# Patient Record
Sex: Female | Born: 1965
Health system: Southern US, Community
[De-identification: ages and names within clinical notes are randomized; demographics above are authoritative.]

## PROBLEM LIST (undated history)

## (undated) ENCOUNTER — Emergency Department (HOSPITAL_COMMUNITY): Admission: EM | Payer: 59 | Source: Home / Self Care

## (undated) DIAGNOSIS — E119 Type 2 diabetes mellitus without complications: Secondary | ICD-10-CM

## (undated) DIAGNOSIS — I4891 Unspecified atrial fibrillation: Secondary | ICD-10-CM

## (undated) DIAGNOSIS — I639 Cerebral infarction, unspecified: Secondary | ICD-10-CM

## (undated) DIAGNOSIS — I714 Abdominal aortic aneurysm, without rupture, unspecified: Secondary | ICD-10-CM

## (undated) DIAGNOSIS — I482 Chronic atrial fibrillation, unspecified: Secondary | ICD-10-CM

## (undated) DIAGNOSIS — I7121 Aneurysm of the ascending aorta, without rupture: Secondary | ICD-10-CM

## (undated) DIAGNOSIS — I10 Essential (primary) hypertension: Secondary | ICD-10-CM

## (undated) DIAGNOSIS — Z0389 Encounter for observation for other suspected diseases and conditions ruled out: Secondary | ICD-10-CM

## (undated) DIAGNOSIS — G8929 Other chronic pain: Secondary | ICD-10-CM

## (undated) DIAGNOSIS — I5022 Chronic systolic (congestive) heart failure: Secondary | ICD-10-CM

## (undated) DIAGNOSIS — K760 Fatty (change of) liver, not elsewhere classified: Secondary | ICD-10-CM

## (undated) DIAGNOSIS — IMO0001 Reserved for inherently not codable concepts without codable children: Secondary | ICD-10-CM

## (undated) DIAGNOSIS — I712 Thoracic aortic aneurysm, without rupture: Secondary | ICD-10-CM

## (undated) DIAGNOSIS — T7840XA Allergy, unspecified, initial encounter: Secondary | ICD-10-CM

## (undated) DIAGNOSIS — I4821 Permanent atrial fibrillation: Secondary | ICD-10-CM

## (undated) DIAGNOSIS — N83209 Unspecified ovarian cyst, unspecified side: Secondary | ICD-10-CM

## (undated) DIAGNOSIS — D649 Anemia, unspecified: Secondary | ICD-10-CM

## (undated) DIAGNOSIS — J449 Chronic obstructive pulmonary disease, unspecified: Secondary | ICD-10-CM

## (undated) DIAGNOSIS — I350 Nonrheumatic aortic (valve) stenosis: Secondary | ICD-10-CM

## (undated) DIAGNOSIS — M1990 Unspecified osteoarthritis, unspecified site: Secondary | ICD-10-CM

## (undated) DIAGNOSIS — R4701 Aphasia: Secondary | ICD-10-CM

## (undated) DIAGNOSIS — F1410 Cocaine abuse, uncomplicated: Secondary | ICD-10-CM

## (undated) DIAGNOSIS — K219 Gastro-esophageal reflux disease without esophagitis: Secondary | ICD-10-CM

## (undated) DIAGNOSIS — F1990 Other psychoactive substance use, unspecified, uncomplicated: Secondary | ICD-10-CM

## (undated) DIAGNOSIS — R109 Unspecified abdominal pain: Secondary | ICD-10-CM

## (undated) DIAGNOSIS — I509 Heart failure, unspecified: Secondary | ICD-10-CM

## (undated) DIAGNOSIS — M109 Gout, unspecified: Secondary | ICD-10-CM

## (undated) HISTORY — DX: Fatty (change of) liver, not elsewhere classified: K76.0

## (undated) HISTORY — DX: Chronic obstructive pulmonary disease, unspecified: J44.9

## (undated) HISTORY — DX: Permanent atrial fibrillation: I48.21

## (undated) HISTORY — DX: Nonrheumatic aortic (valve) stenosis: I35.0

## (undated) HISTORY — DX: Chronic systolic (congestive) heart failure: I50.22

## (undated) HISTORY — DX: Encounter for observation for other suspected diseases and conditions ruled out: Z03.89

## (undated) HISTORY — DX: Gastro-esophageal reflux disease without esophagitis: K21.9

## (undated) HISTORY — DX: Other psychoactive substance use, unspecified, uncomplicated: F19.90

## (undated) HISTORY — DX: Allergy, unspecified, initial encounter: T78.40XA

## (undated) HISTORY — DX: Unspecified osteoarthritis, unspecified site: M19.90

## (undated) HISTORY — DX: Type 2 diabetes mellitus without complications: E11.9

## (undated) HISTORY — DX: Anemia, unspecified: D64.9

## (undated) HISTORY — DX: Reserved for inherently not codable concepts without codable children: IMO0001

## (undated) HISTORY — DX: Cocaine abuse, uncomplicated: F14.10

## (undated) HISTORY — DX: Essential (primary) hypertension: I10

---

## 1992-08-05 HISTORY — PX: TUBAL LIGATION: SHX77

## 1993-08-05 HISTORY — PX: CHOLECYSTECTOMY: SHX55

## 1997-12-23 ENCOUNTER — Emergency Department (HOSPITAL_COMMUNITY): Admission: EM | Admit: 1997-12-23 | Discharge: 1997-12-23 | Payer: Self-pay | Admitting: *Deleted

## 1998-01-12 ENCOUNTER — Emergency Department (HOSPITAL_COMMUNITY): Admission: EM | Admit: 1998-01-12 | Discharge: 1998-01-12 | Payer: Self-pay | Admitting: Emergency Medicine

## 1998-01-26 ENCOUNTER — Other Ambulatory Visit: Admission: RE | Admit: 1998-01-26 | Discharge: 1998-01-26 | Payer: Self-pay | Admitting: Obstetrics

## 1998-03-03 ENCOUNTER — Emergency Department (HOSPITAL_COMMUNITY): Admission: EM | Admit: 1998-03-03 | Discharge: 1998-03-03 | Payer: Self-pay | Admitting: Emergency Medicine

## 2001-04-13 ENCOUNTER — Emergency Department (HOSPITAL_COMMUNITY): Admission: EM | Admit: 2001-04-13 | Discharge: 2001-04-13 | Payer: Self-pay | Admitting: Emergency Medicine

## 2001-04-13 ENCOUNTER — Encounter: Payer: Self-pay | Admitting: Emergency Medicine

## 2002-03-25 ENCOUNTER — Emergency Department (HOSPITAL_COMMUNITY): Admission: EM | Admit: 2002-03-25 | Discharge: 2002-03-25 | Payer: Self-pay | Admitting: Emergency Medicine

## 2002-03-25 ENCOUNTER — Encounter: Payer: Self-pay | Admitting: Emergency Medicine

## 2002-06-07 ENCOUNTER — Emergency Department (HOSPITAL_COMMUNITY): Admission: EM | Admit: 2002-06-07 | Discharge: 2002-06-07 | Payer: Self-pay | Admitting: Emergency Medicine

## 2002-07-13 ENCOUNTER — Encounter: Payer: Self-pay | Admitting: *Deleted

## 2002-07-13 ENCOUNTER — Emergency Department (HOSPITAL_COMMUNITY): Admission: EM | Admit: 2002-07-13 | Discharge: 2002-07-13 | Payer: Self-pay | Admitting: *Deleted

## 2002-08-23 ENCOUNTER — Emergency Department (HOSPITAL_COMMUNITY): Admission: EM | Admit: 2002-08-23 | Discharge: 2002-08-23 | Payer: Self-pay | Admitting: Emergency Medicine

## 2002-08-23 ENCOUNTER — Encounter: Payer: Self-pay | Admitting: Emergency Medicine

## 2003-04-27 ENCOUNTER — Emergency Department (HOSPITAL_COMMUNITY): Admission: EM | Admit: 2003-04-27 | Discharge: 2003-04-28 | Payer: Self-pay | Admitting: Emergency Medicine

## 2003-04-27 ENCOUNTER — Encounter: Payer: Self-pay | Admitting: Emergency Medicine

## 2003-10-05 ENCOUNTER — Emergency Department (HOSPITAL_COMMUNITY): Admission: EM | Admit: 2003-10-05 | Discharge: 2003-10-05 | Payer: Self-pay | Admitting: Emergency Medicine

## 2005-02-19 ENCOUNTER — Emergency Department (HOSPITAL_COMMUNITY): Admission: EM | Admit: 2005-02-19 | Discharge: 2005-02-20 | Payer: Self-pay | Admitting: Emergency Medicine

## 2005-02-20 IMAGING — CT CT ABDOMEN W/ CM
1 of 3 series · 14 of 32 positions shown, 19 images · IV contrast (omnipaque)
Comparison: none

CLINICAL DATA: 38-year-old female with midline abdominal pain.  Status post hysterectomy and prior C-section. 
ABDOMEN CT WITH CONTRAST:
TECHNIQUE: Multidetector CT imaging of the abdomen was performed following the standard protocol during bolus administration of intravenous contrast.
Contrast:  100 cc Omnipaque 300 and oral contrast.
TECHNIQUE: Multidetector CT imaging of the pelvis was performed following the standard protocol during bolus administration of intravenous contrast.

[Series 4929: — · axial · 0.74mm/px · z∈[+1324,+1700]mm · 14 of 85 slices shown, 19 images]
[im 5/85  soft-tissue]
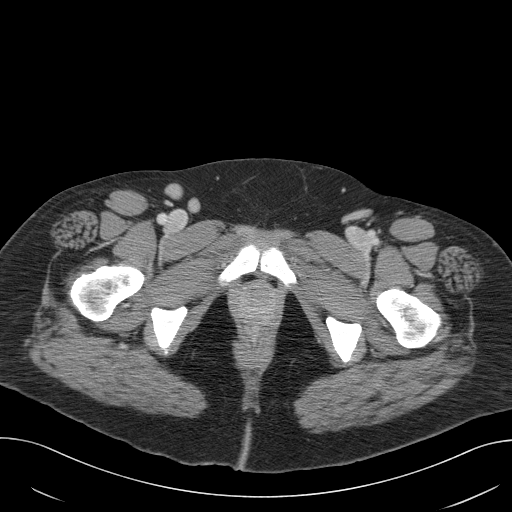
[im 5/85  bone]
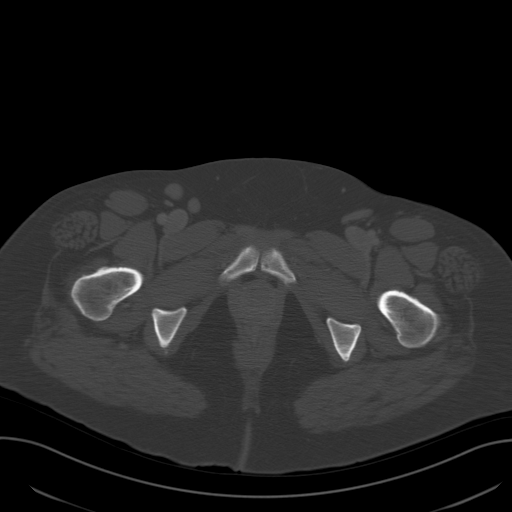
[im 10/85  soft-tissue]
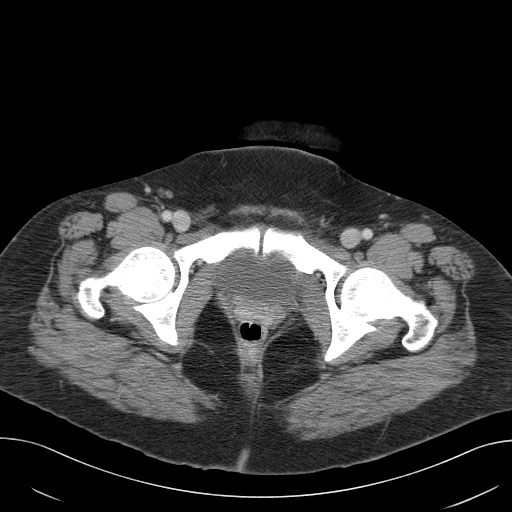
[im 20/85  soft-tissue]
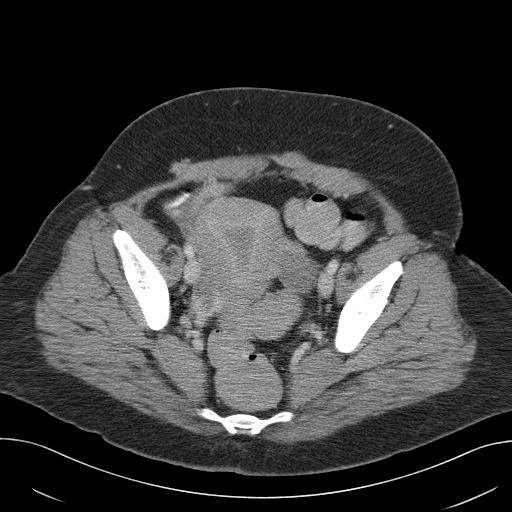
[im 25/85  soft-tissue]
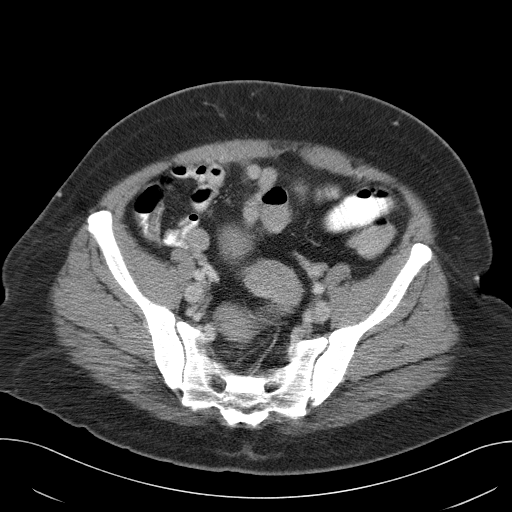
[im 30/85  soft-tissue]
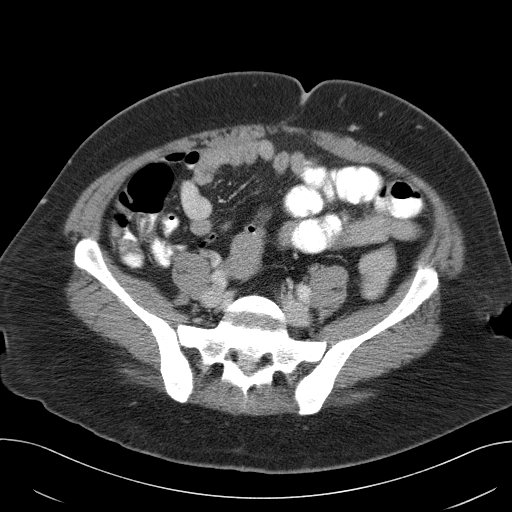
[im 35/85  soft-tissue]
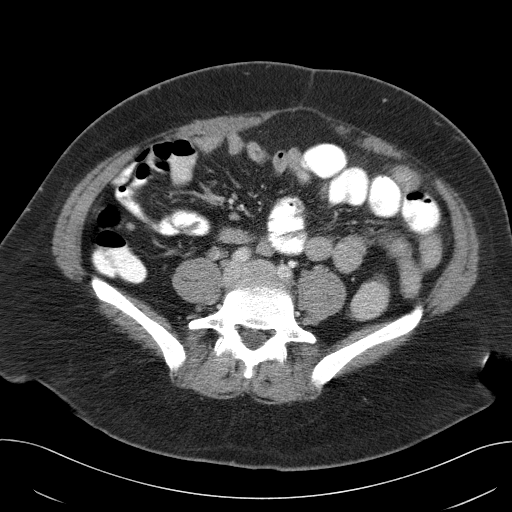
[im 45/85  soft-tissue]
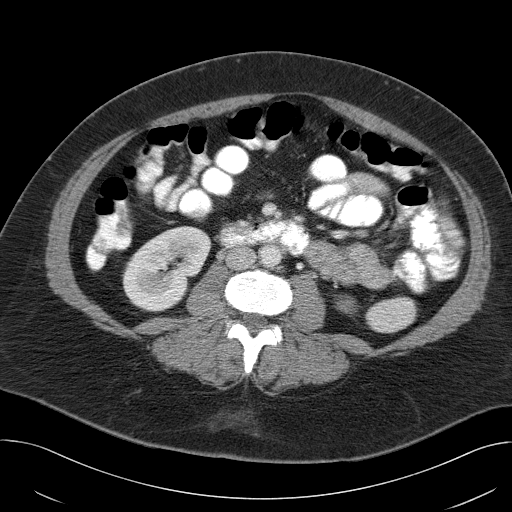
[im 50/85  soft-tissue]
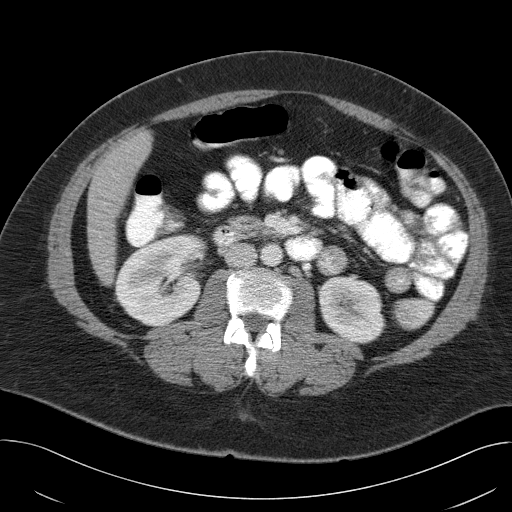
[im 55/85  soft-tissue]
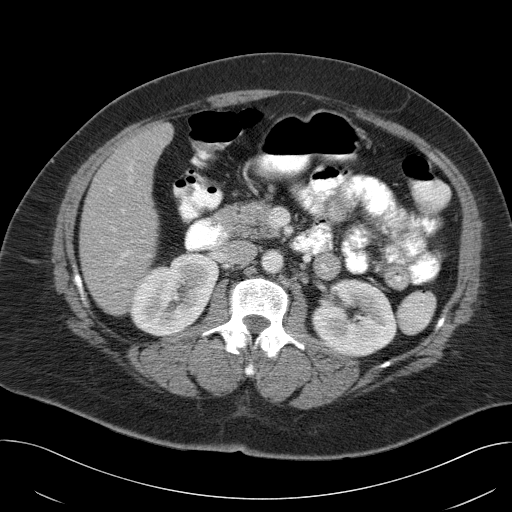
[im 55/85  bone]
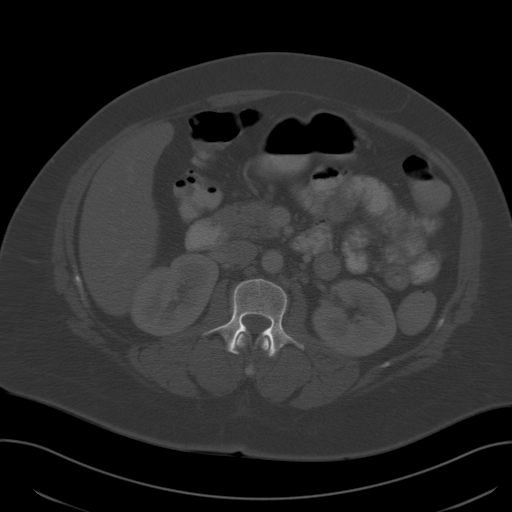
[im 60/85  soft-tissue]
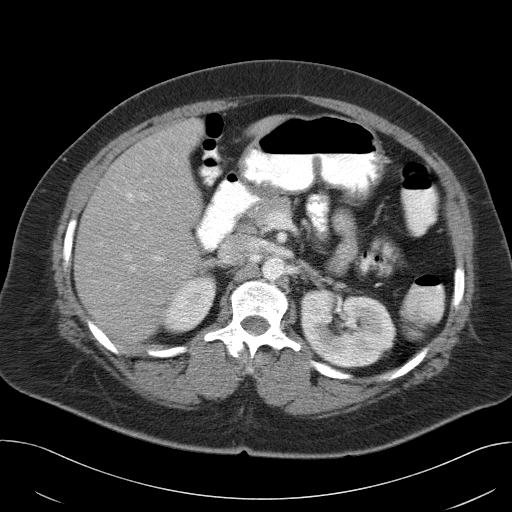
[im 65/85  soft-tissue]
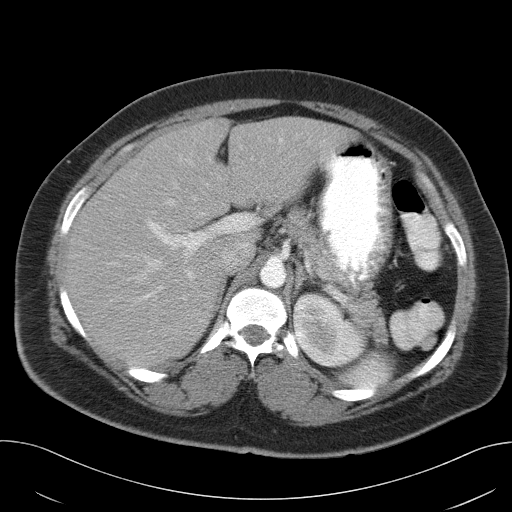
[im 65/85  lung]
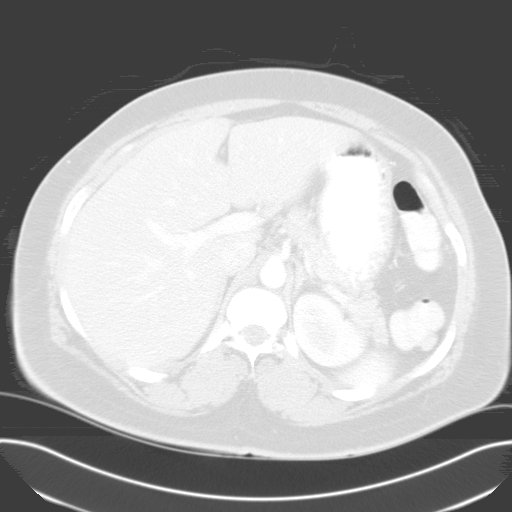
[im 70/85  lung]
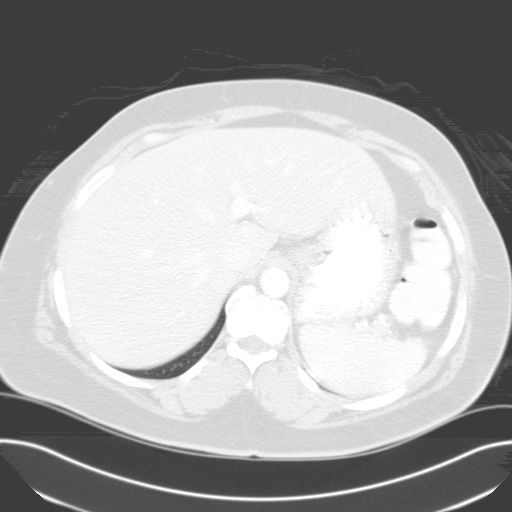
[im 75/85  soft-tissue]
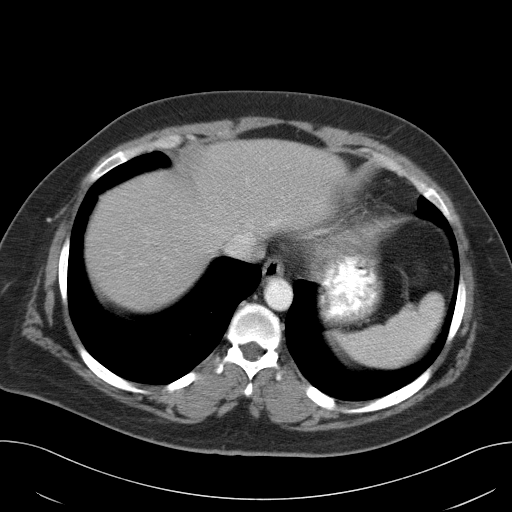
[im 75/85  lung]
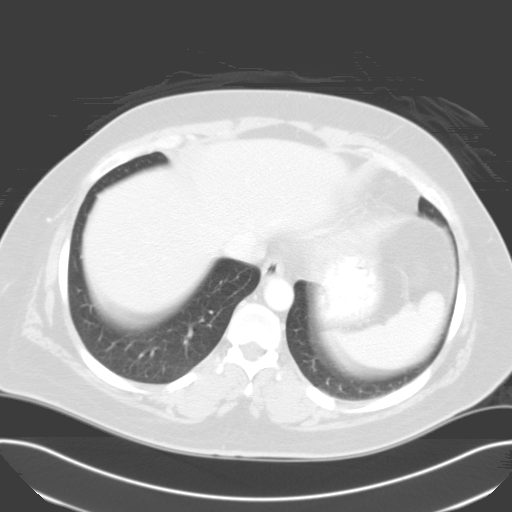
[im 80/85  soft-tissue]
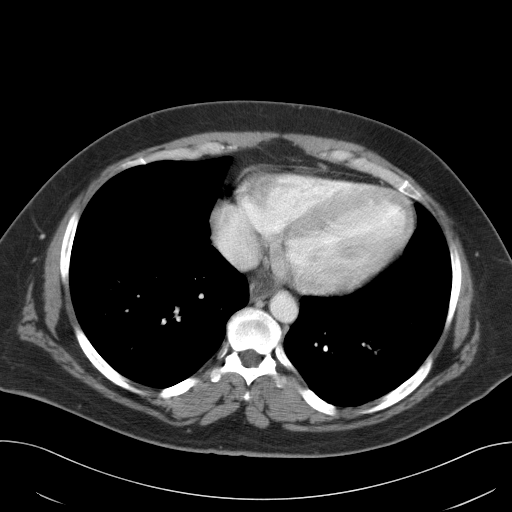
[im 80/85  lung]
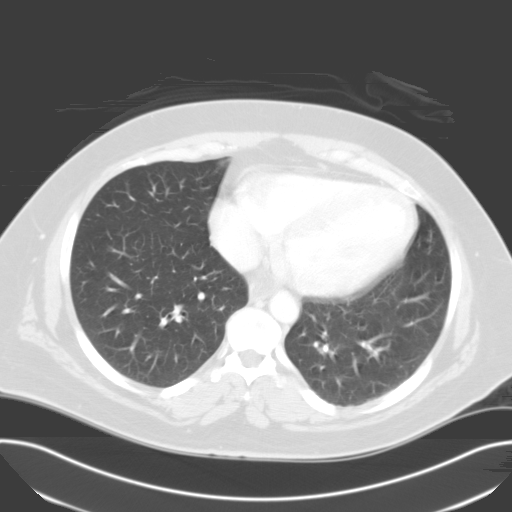

[14 of 32 positions shown; findings below may reference images not displayed]

FINDINGS: Lung bases are clear.  Normal heart size.  No pericardial or pleural fluid. 
The patient has had a cholecystectomy.  Liver, kidneys, adrenal glands, spleen, and pancreas are normal for age.  No bowel wall thickening, dilatation, obstruction, ascites, adenopathy, or free air.  In the right lower quadrant, the terminal ileum and appendix are normal.
IMPRESSION: 1.  No acute finding in the abdomen. 
2.  Status post cholecystectomy. 
PELVIS CT WITH CONTRAST:
FINDINGS: Mildly prominent inguinal lymph nodes.  No pelvic adenopathy, free fluid, or bowel abnormality.  Prominent ovaries.
IMPRESSION: No acute pelvic finding.

## 2006-03-26 ENCOUNTER — Emergency Department (HOSPITAL_COMMUNITY): Admission: EM | Admit: 2006-03-26 | Discharge: 2006-03-26 | Payer: Self-pay | Admitting: Emergency Medicine

## 2006-04-03 ENCOUNTER — Emergency Department (HOSPITAL_COMMUNITY): Admission: EM | Admit: 2006-04-03 | Discharge: 2006-04-03 | Payer: Self-pay | Admitting: Emergency Medicine

## 2006-04-03 IMAGING — CR DG CHEST 2V
2 series · 2 of 2 positions shown · non-contrast
Comparison: none

CLINICAL DATA: Upper back pain with breathing

Chest 2 view:
No previous available for comparison. Mild enlargement of cardiac silhouette.
Lungs clear. No effusion. Vascular clips in the right upper abdomen.

[view not recorded (1 of 2)]
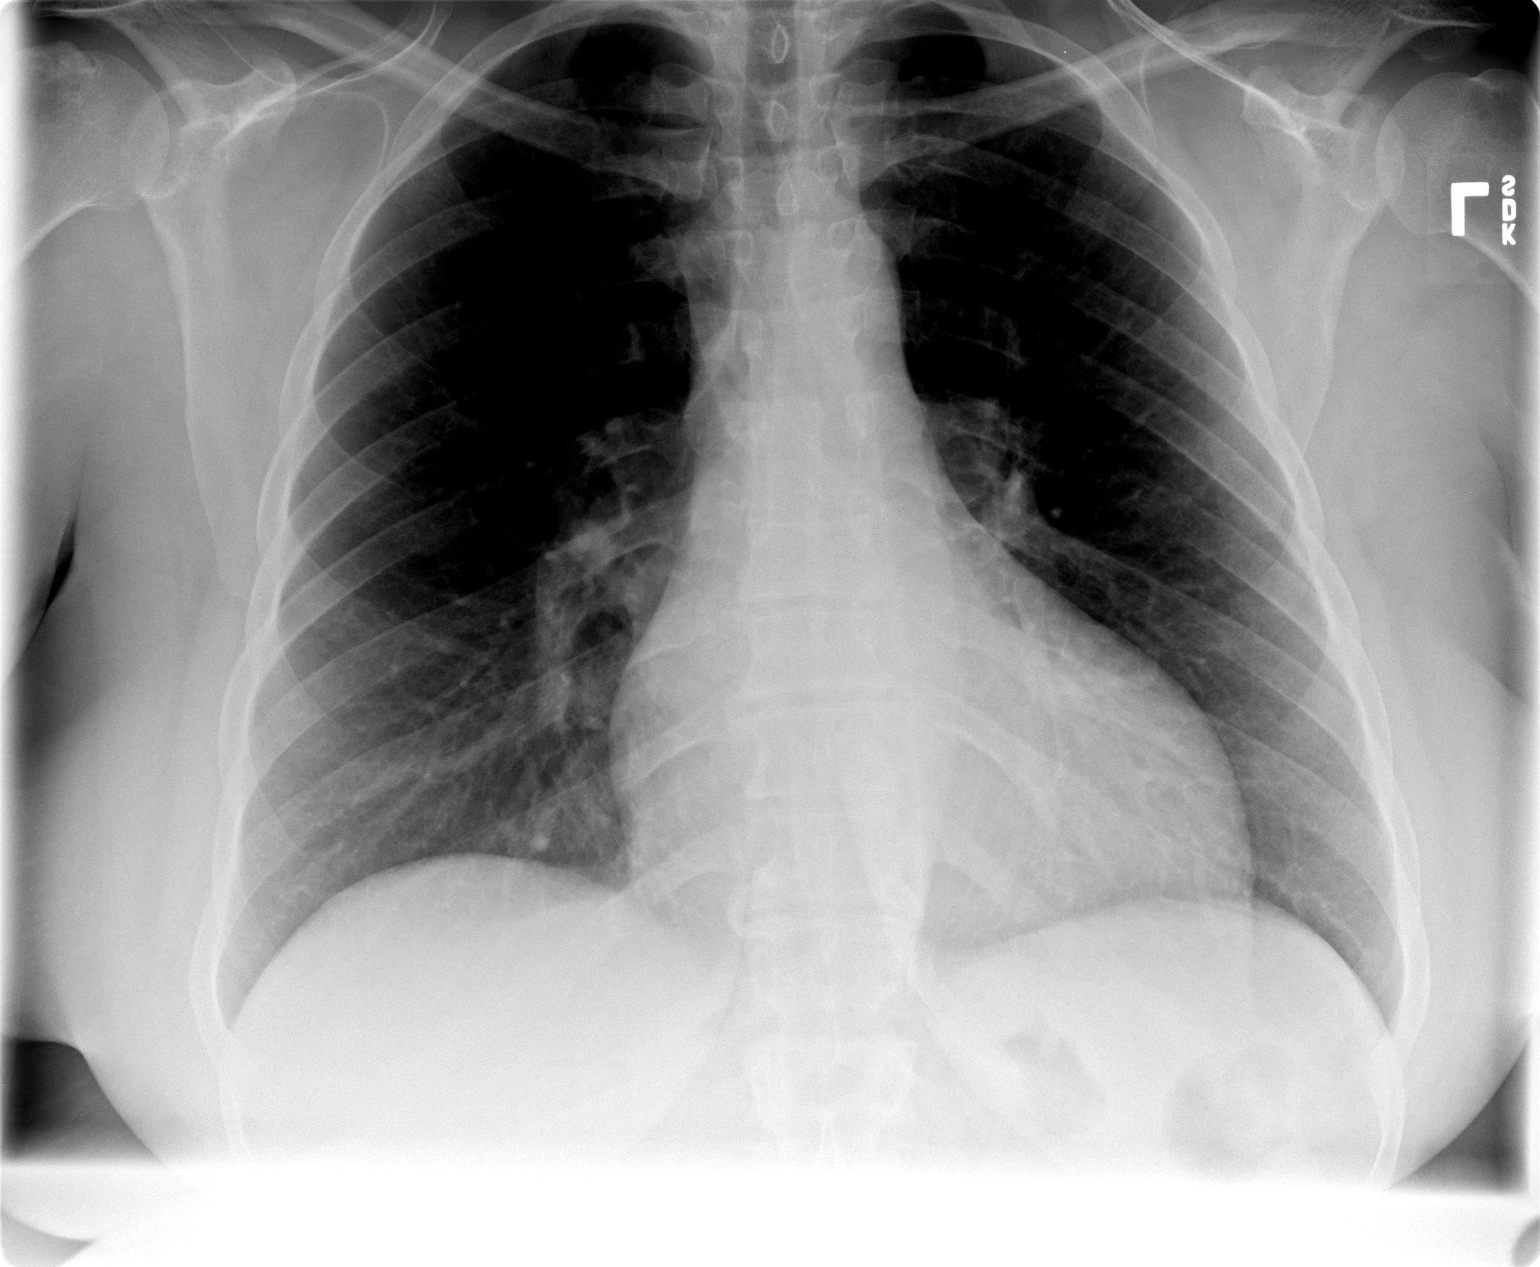

[view not recorded (2 of 2)]
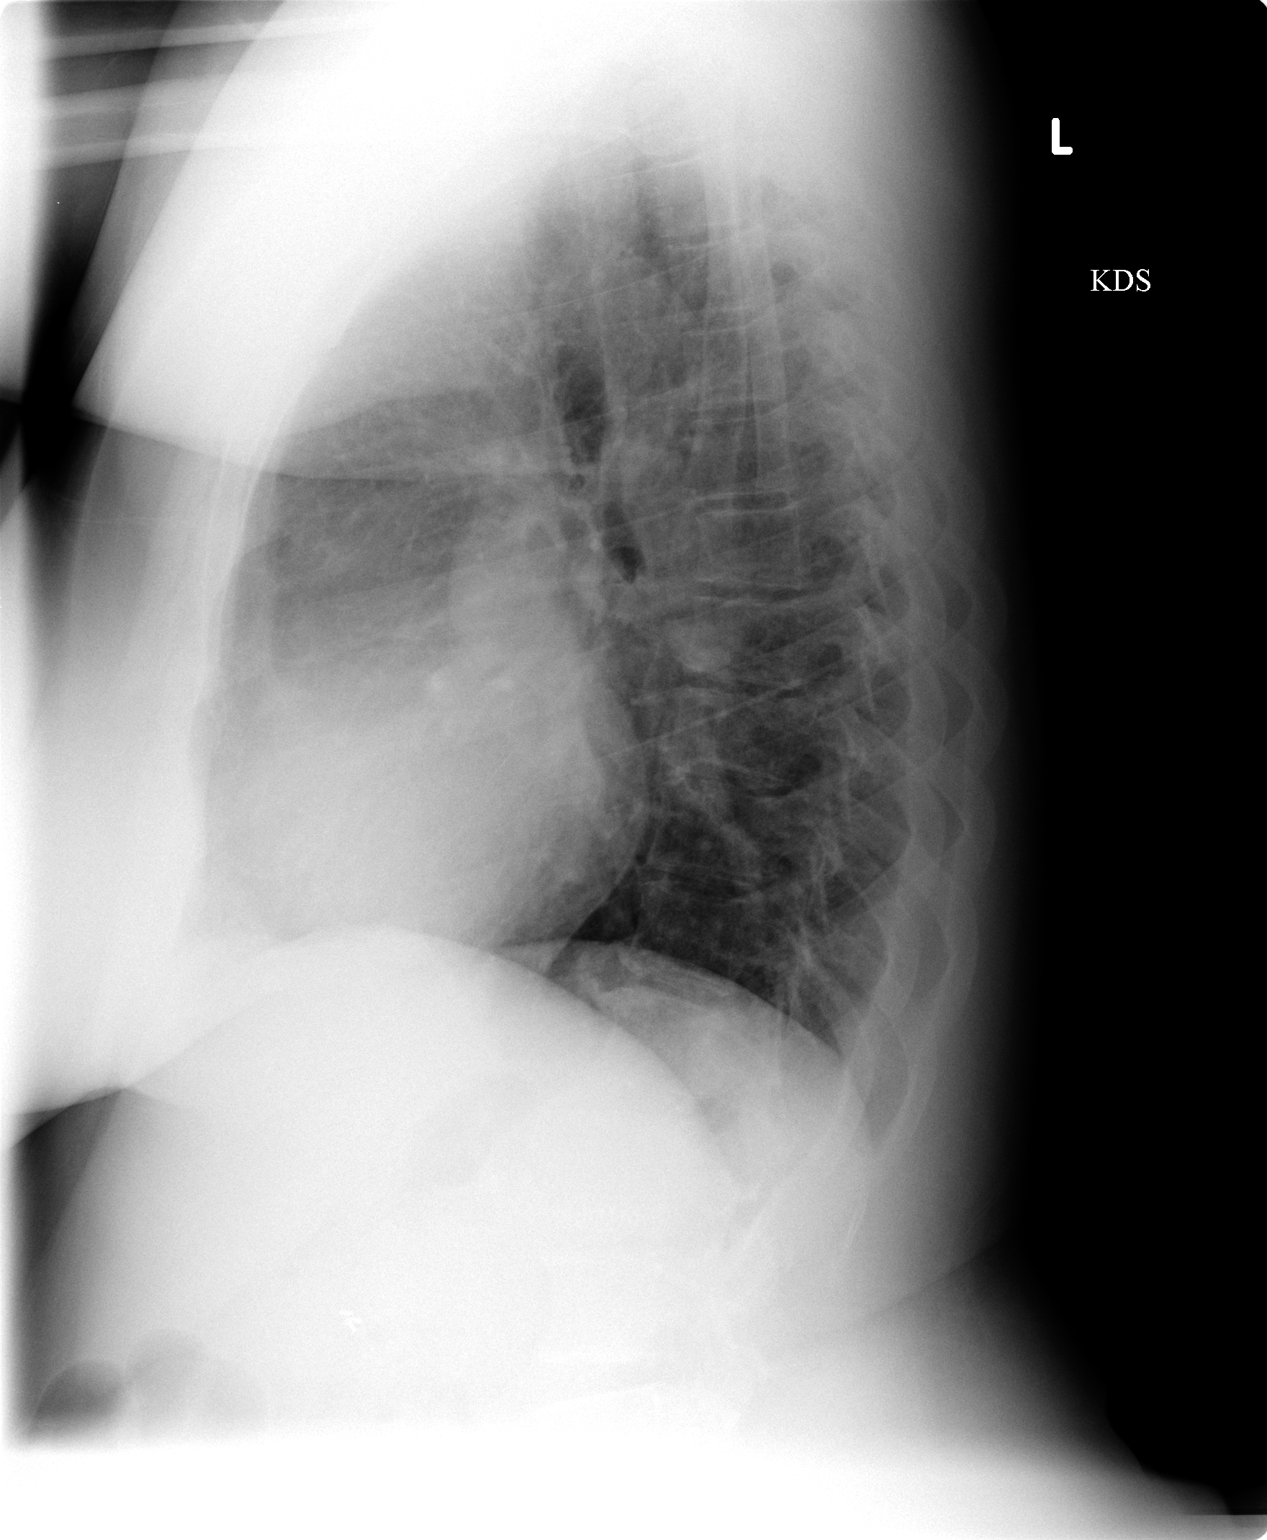

[2 of 2 positions shown; findings below may reference images not displayed]

IMPRESSION: 1. Mild cardiomegaly

## 2006-12-08 ENCOUNTER — Emergency Department (HOSPITAL_COMMUNITY): Admission: EM | Admit: 2006-12-08 | Discharge: 2006-12-08 | Payer: Self-pay | Admitting: Emergency Medicine

## 2006-12-08 IMAGING — CR DG CHEST 2V
2 series · 2 of 2 positions shown · non-contrast
Comparison: none

HISTORY: Chest pain, dyspnea, slight cough

CHEST 2 VIEWS:
Comparison [DATE]
Cardiomegaly with left ventricular prominence.
Minimal pulmonary vascular congestion.
Mediastinal contours stable.
Mild bronchitic changes without infiltrate or effusion.
No pneumothorax or focal bone lesion.

[view not recorded (1 of 2)]
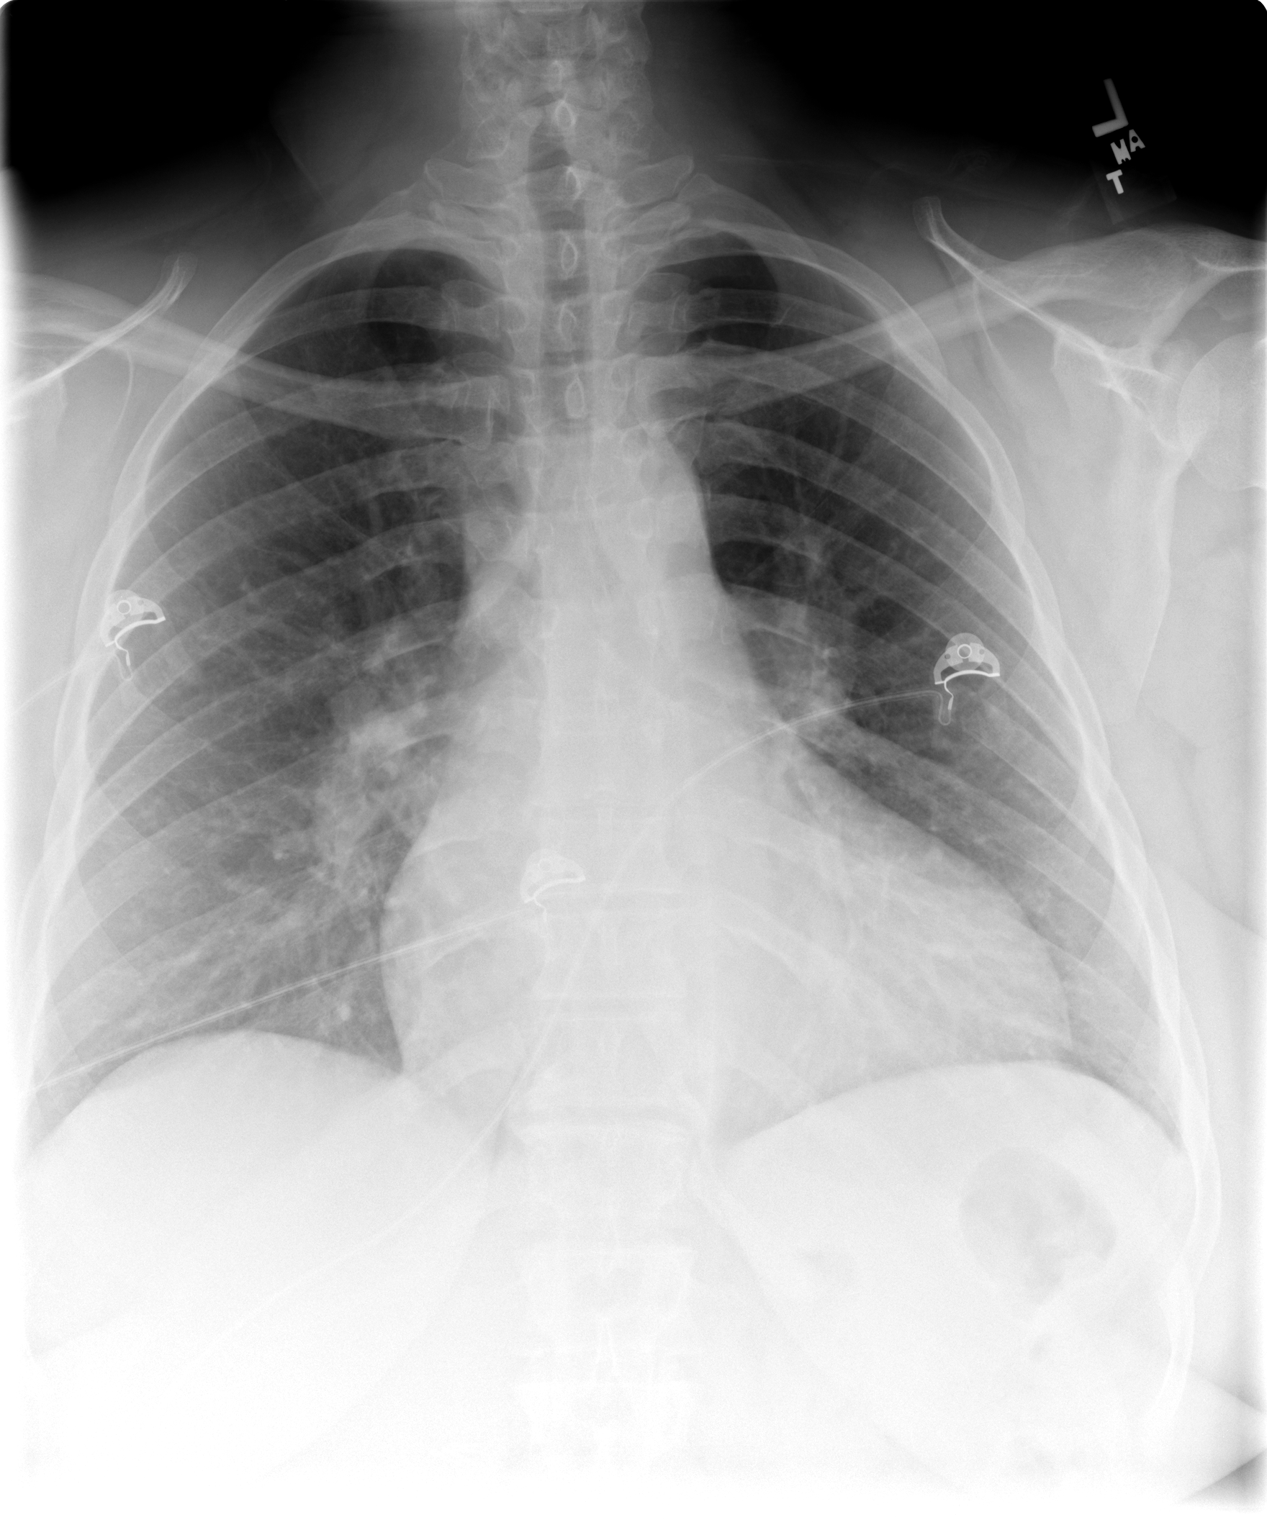

[view not recorded (2 of 2)]
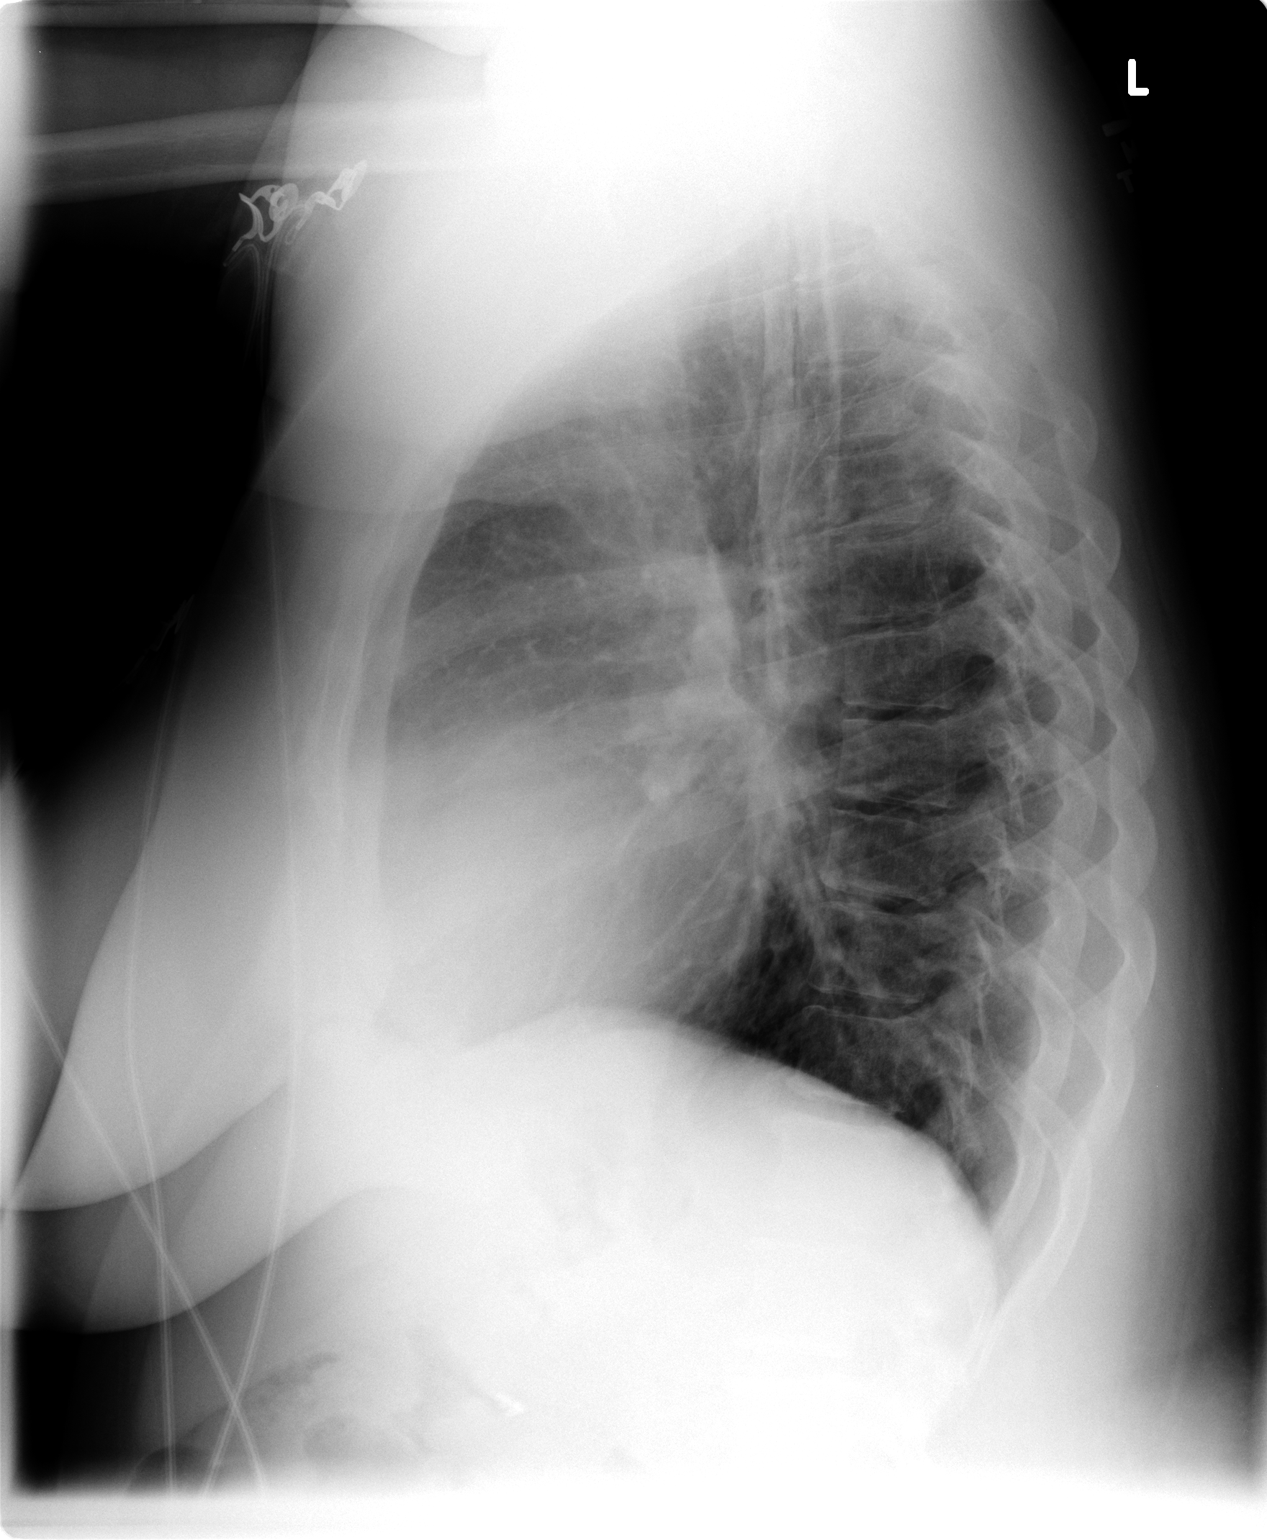

[2 of 2 positions shown; findings below may reference images not displayed]

IMPRESSION: Cardiomegaly with slight pulmonary vascular congestion.
Minimal bronchitic changes.

## 2007-02-03 ENCOUNTER — Emergency Department (HOSPITAL_COMMUNITY): Admission: EM | Admit: 2007-02-03 | Discharge: 2007-02-03 | Payer: Self-pay | Admitting: Emergency Medicine

## 2007-02-03 IMAGING — CR DG FINGER RING 2+V*L*
1 series · 1 of 1 positions shown · non-contrast
Comparison: none

CLINICAL DATA: Injury.  Pain and swelling.  
 LEFT RING FINGER ? 3 VIEW:

[view not recorded]
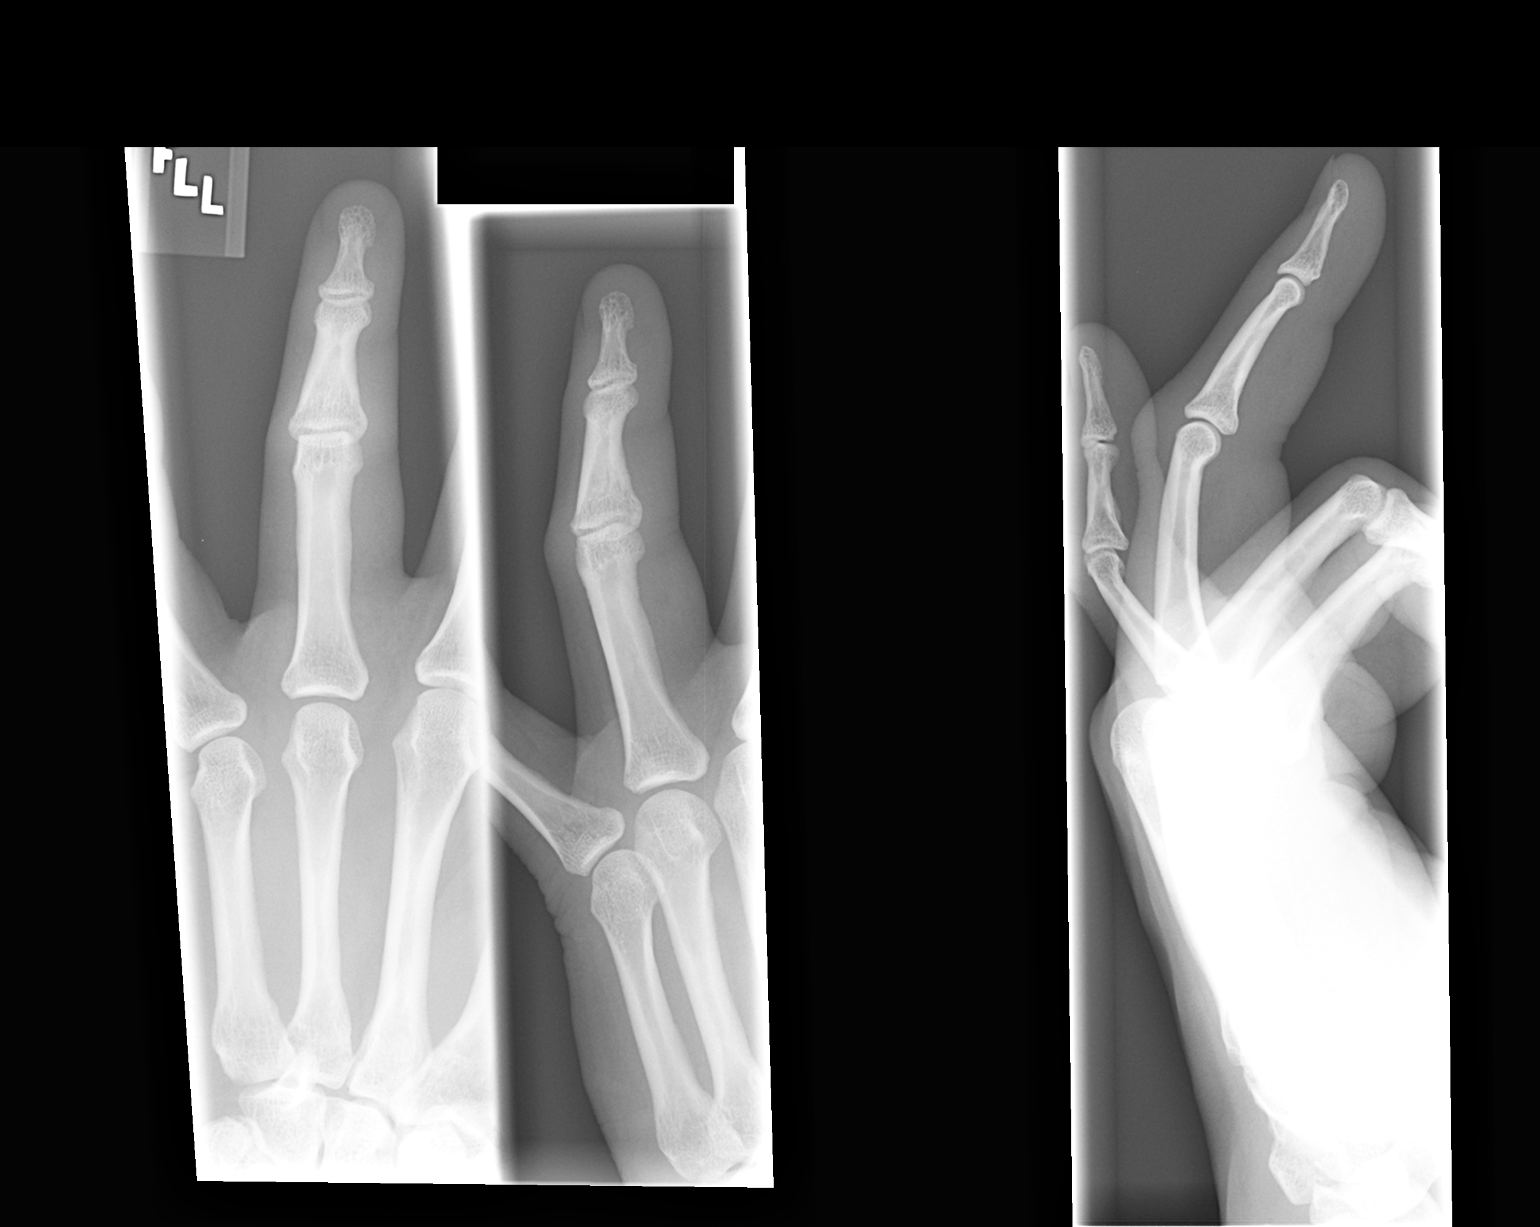

[1 of 1 positions shown; findings below may reference images not displayed]

FINDINGS: No fracture or subluxation.
IMPRESSION: No acute bony abnormality.

## 2007-03-12 ENCOUNTER — Encounter (HOSPITAL_COMMUNITY): Admission: RE | Admit: 2007-03-12 | Discharge: 2007-04-11 | Payer: Self-pay | Admitting: Oncology

## 2007-03-12 ENCOUNTER — Ambulatory Visit (HOSPITAL_COMMUNITY): Payer: Self-pay | Admitting: Oncology

## 2007-05-21 ENCOUNTER — Emergency Department (HOSPITAL_COMMUNITY): Admission: EM | Admit: 2007-05-21 | Discharge: 2007-05-21 | Payer: Self-pay | Admitting: Emergency Medicine

## 2007-07-27 ENCOUNTER — Emergency Department (HOSPITAL_COMMUNITY): Admission: EM | Admit: 2007-07-27 | Discharge: 2007-07-27 | Payer: Self-pay | Admitting: Emergency Medicine

## 2007-07-27 IMAGING — CR DG CHEST 2V
2 series · 2 of 2 positions shown · non-contrast
Comparison: none

HISTORY: Chest pain, dyspnea, cough, question bronchitis

CHEST 2 VIEWS:
Comparison [DATE]
Cardiac enlargement stable.
Normal mediastinal contours and pulmonary vascularity.
Lungs clear.
No pleural effusion or pneumothorax.
Bones unremarkable.

[view not recorded (1 of 2)]
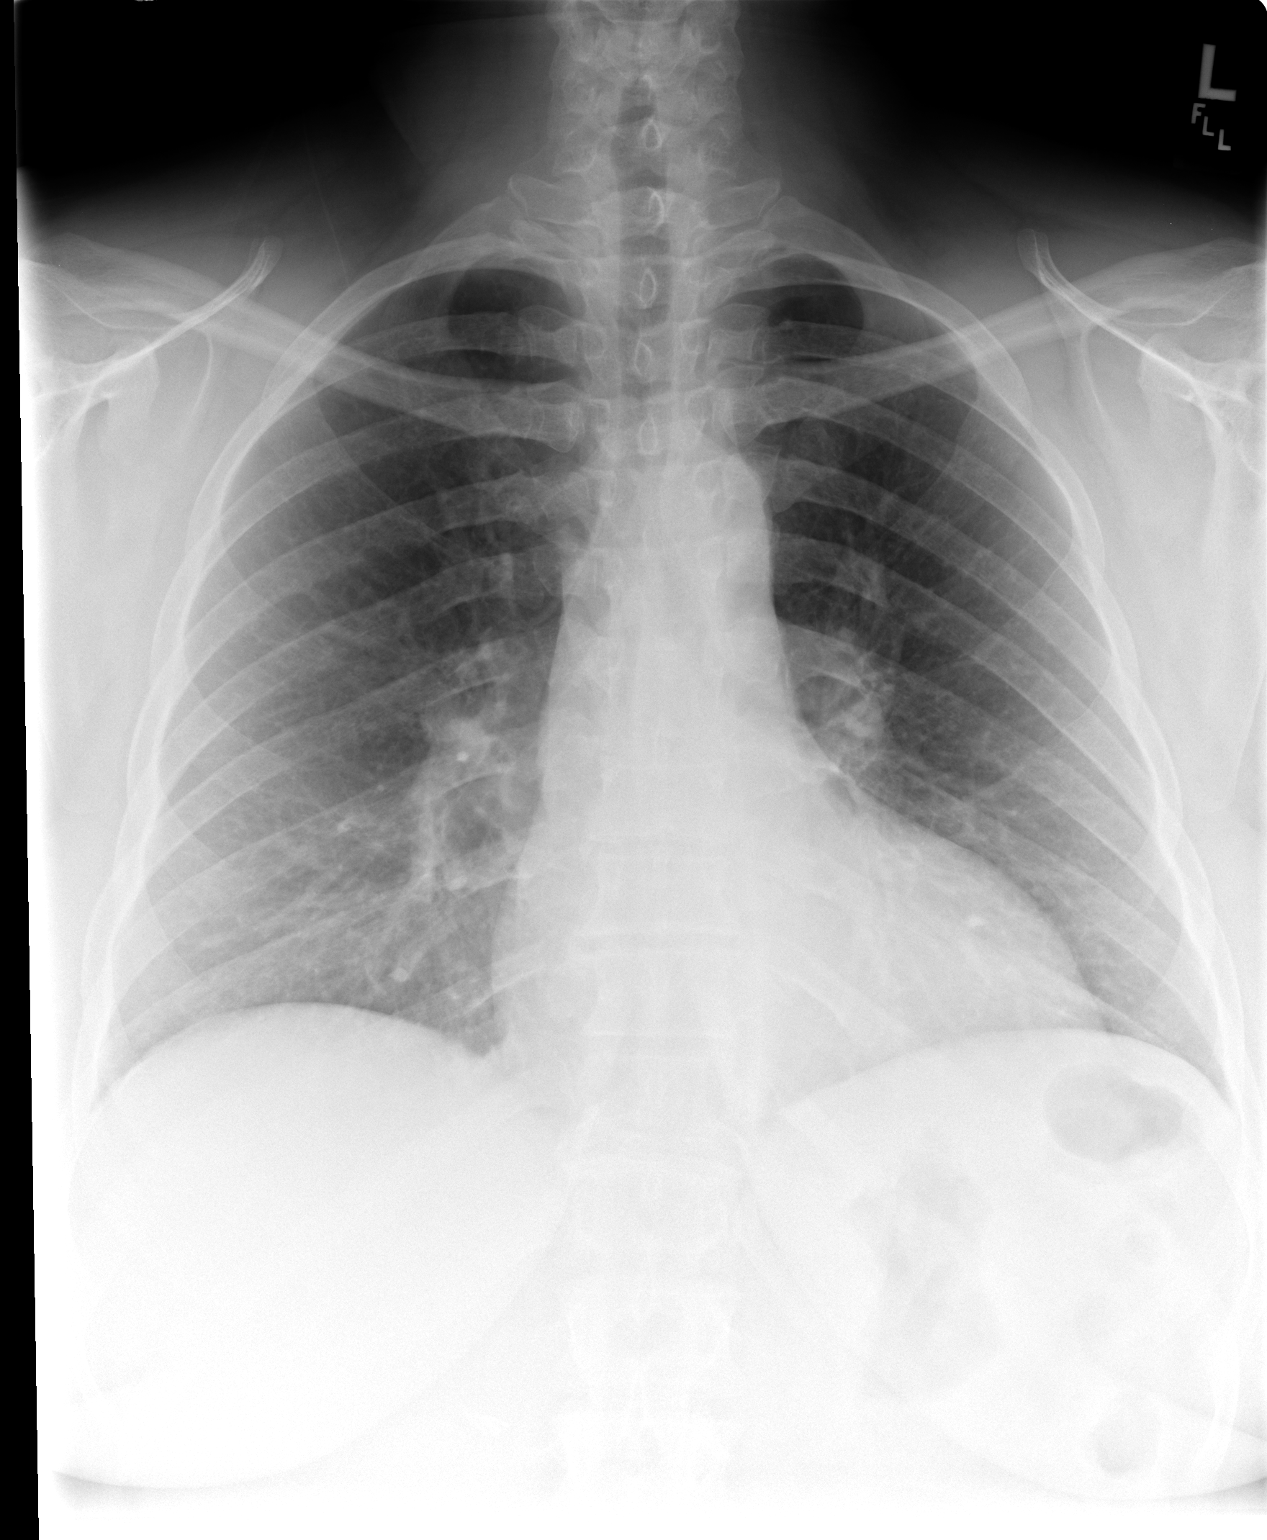

[view not recorded (2 of 2)]
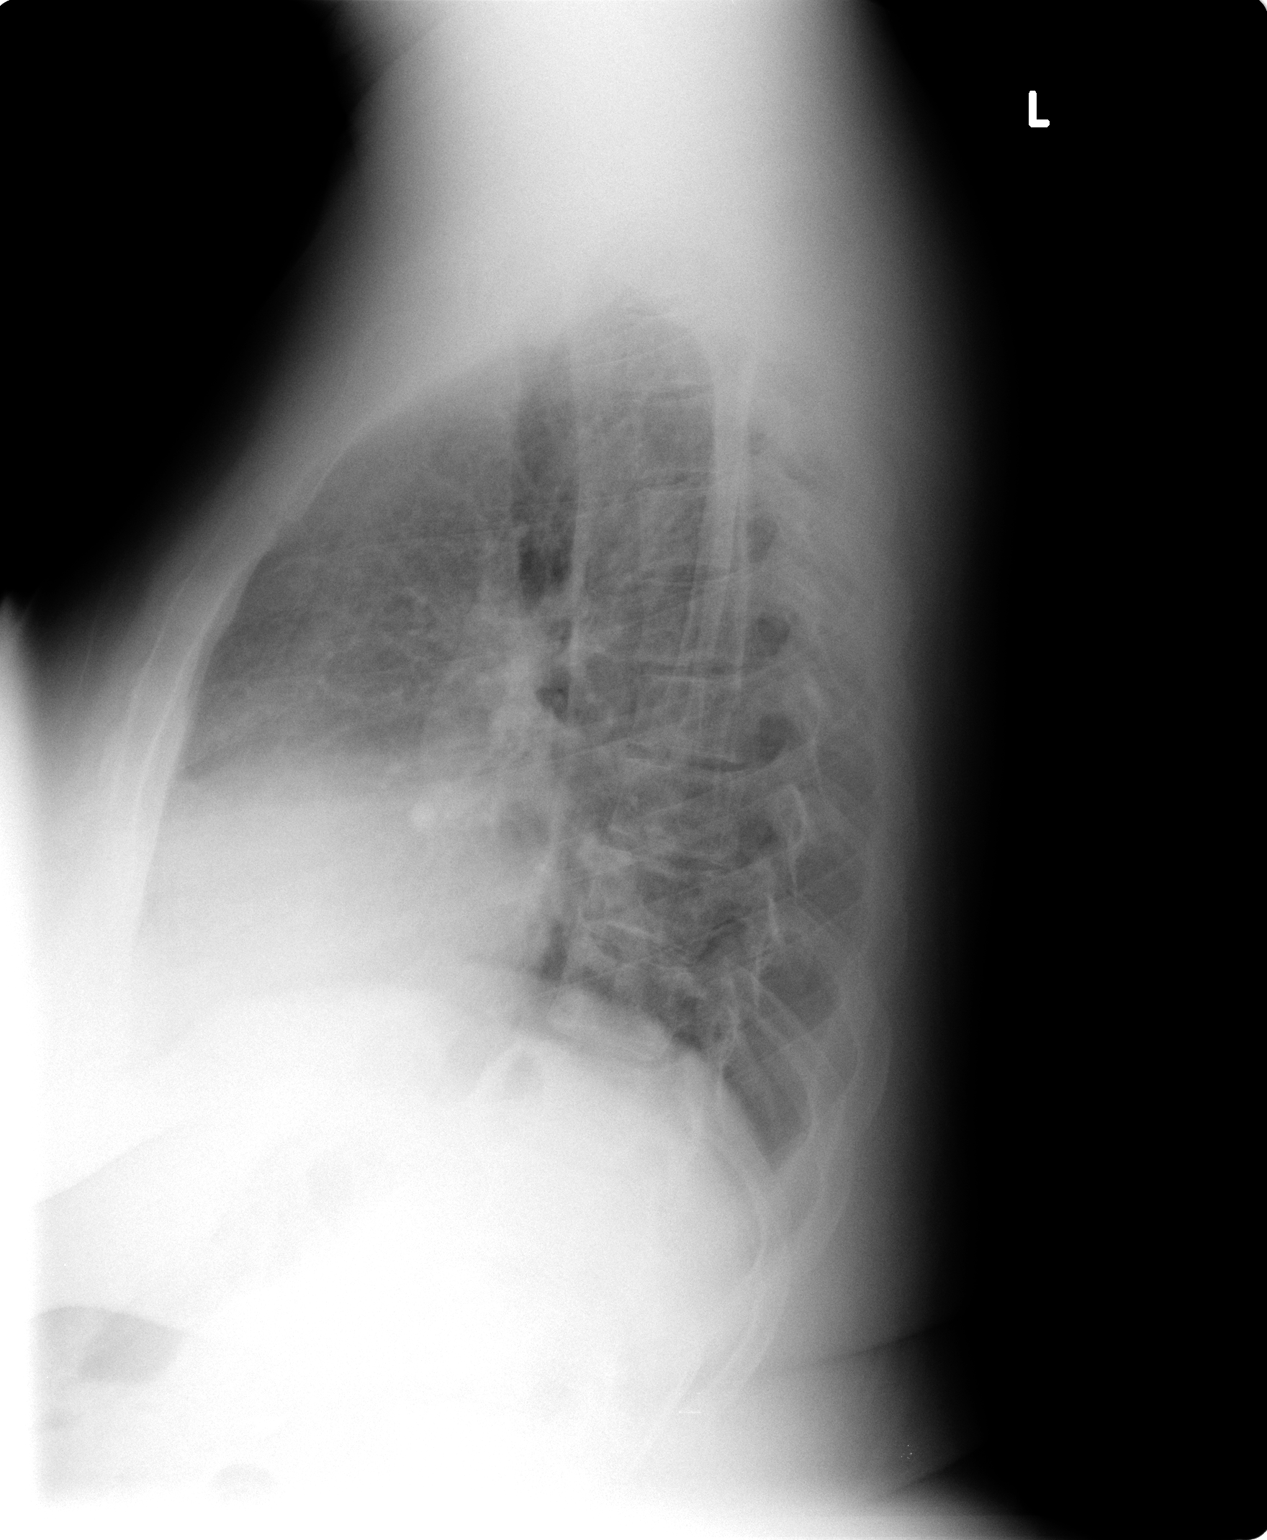

[2 of 2 positions shown; findings below may reference images not displayed]

IMPRESSION: Cardiomegaly.
No acute abnormalities.

## 2007-11-09 ENCOUNTER — Emergency Department (HOSPITAL_COMMUNITY): Admission: EM | Admit: 2007-11-09 | Discharge: 2007-11-09 | Payer: Self-pay | Admitting: Emergency Medicine

## 2007-12-06 ENCOUNTER — Emergency Department (HOSPITAL_COMMUNITY): Admission: EM | Admit: 2007-12-06 | Discharge: 2007-12-06 | Payer: Self-pay | Admitting: Emergency Medicine

## 2008-01-11 ENCOUNTER — Emergency Department (HOSPITAL_COMMUNITY): Admission: EM | Admit: 2008-01-11 | Discharge: 2008-01-11 | Payer: Self-pay | Admitting: Emergency Medicine

## 2008-02-01 ENCOUNTER — Emergency Department (HOSPITAL_COMMUNITY): Admission: EM | Admit: 2008-02-01 | Discharge: 2008-02-01 | Payer: Self-pay | Admitting: Emergency Medicine

## 2008-04-26 ENCOUNTER — Other Ambulatory Visit: Admission: RE | Admit: 2008-04-26 | Discharge: 2008-04-26 | Payer: Self-pay | Admitting: Obstetrics and Gynecology

## 2008-04-28 ENCOUNTER — Ambulatory Visit (HOSPITAL_COMMUNITY): Admission: RE | Admit: 2008-04-28 | Discharge: 2008-04-28 | Payer: Self-pay | Admitting: Obstetrics and Gynecology

## 2008-04-28 IMAGING — US US PELVIS COMPLETE MODIFY
1 series · 14 of 25 positions shown · non-contrast
Comparison: None

CLINICAL DATA: Heavy periods

TRANSABDOMINAL AND TRANSVAGINAL ULTRASOUND OF PELVIS
TECHNIQUE: Both transabdominal and transvaginal ultrasound
examinations of the pelvis were performed including evaluation of
the uterus, ovaries, adnexal regions, and pelvic cul-de-sac.

[Series 1: us pelvis complete modify · 0.32mm/px · 14 of 67 slices shown]
[im 1/67]
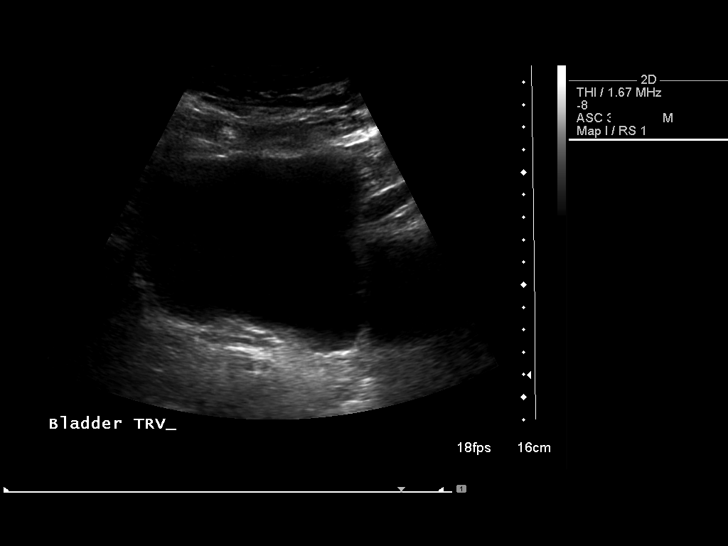
[im 6/67]
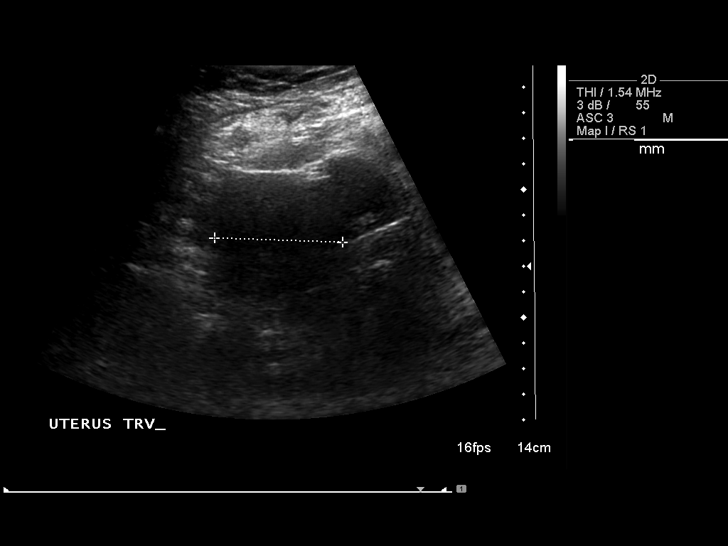
[im 12/67]
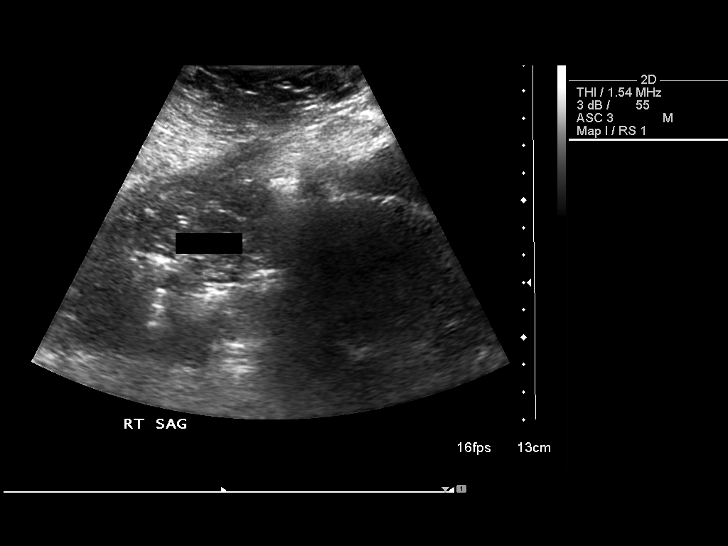
[im 17/67]
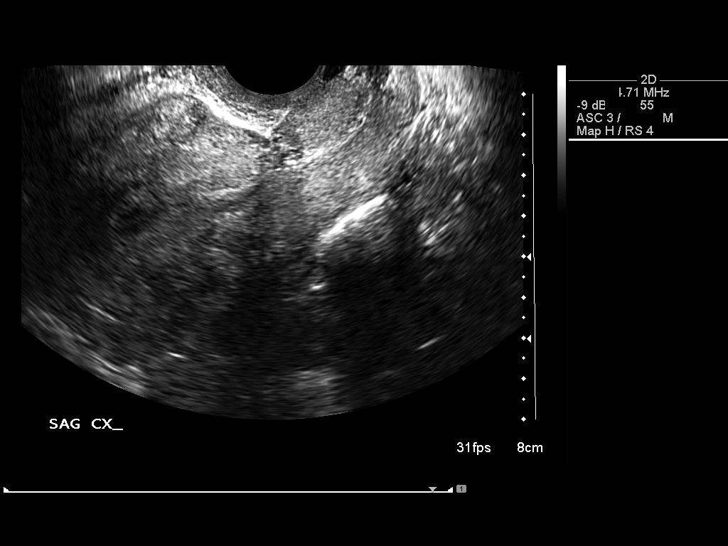
[im 23/67]
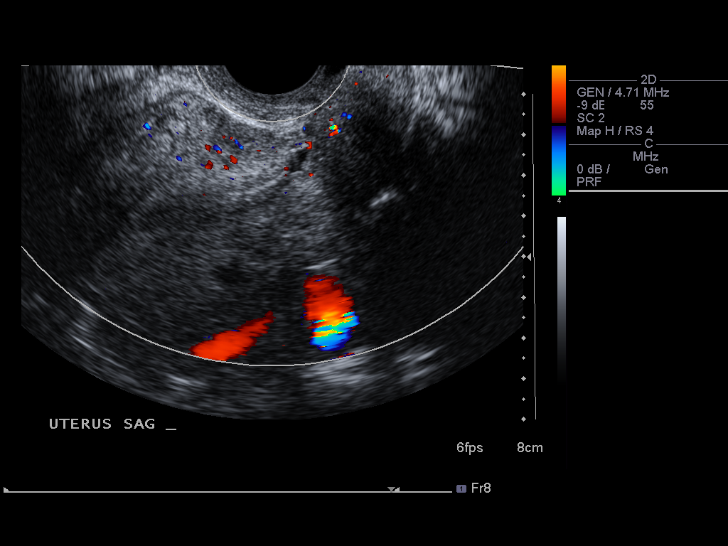
[im 25/67]
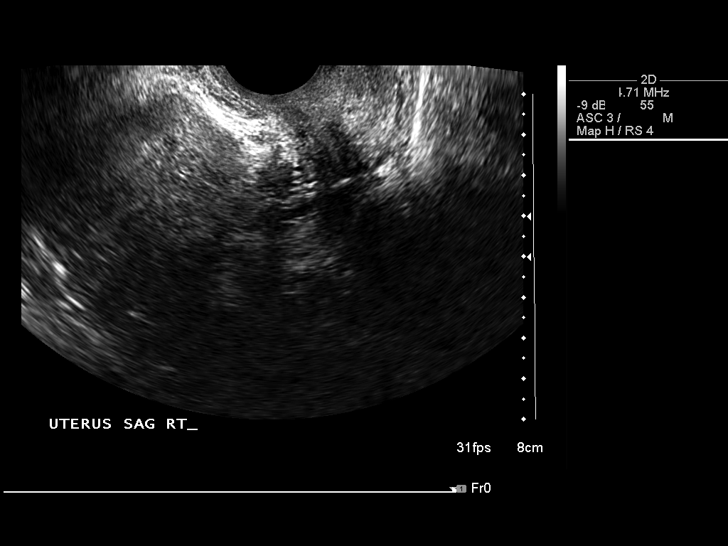
[im 31/67]
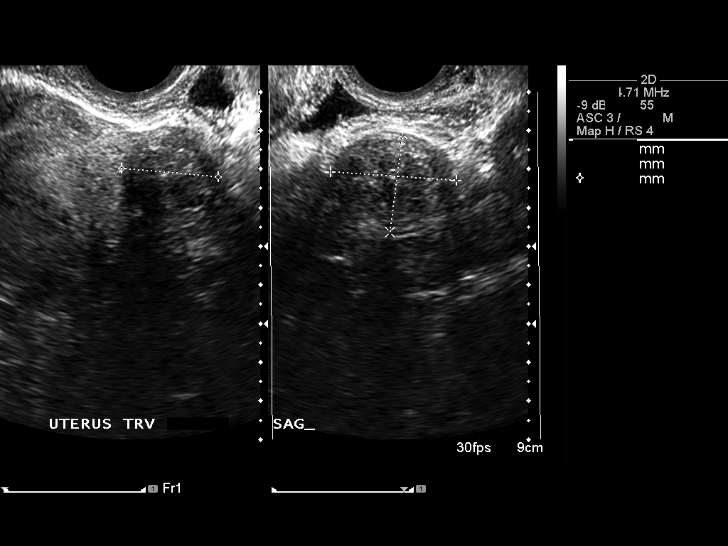
[im 36/67]
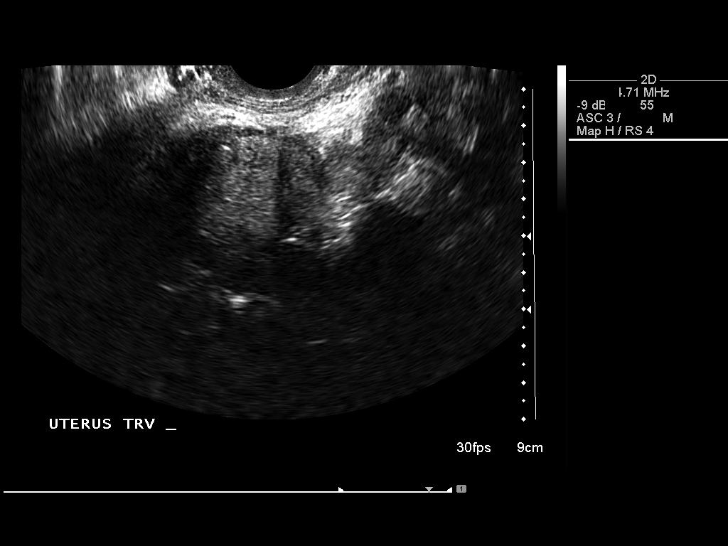
[im 42/67]
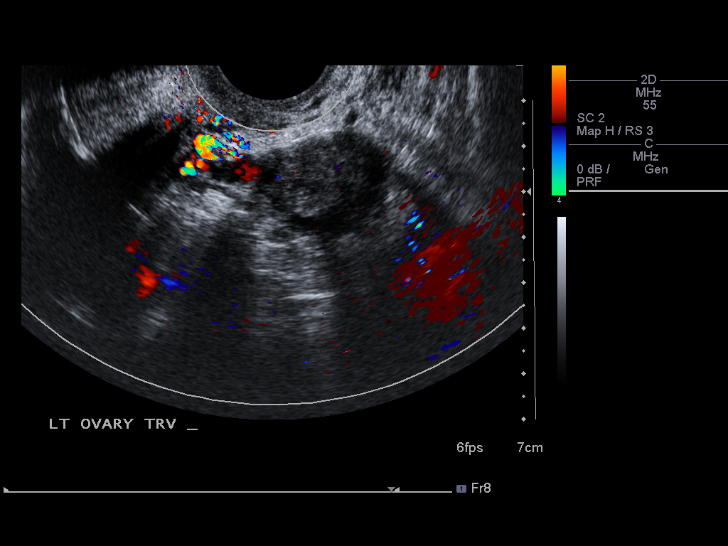
[im 45/67]
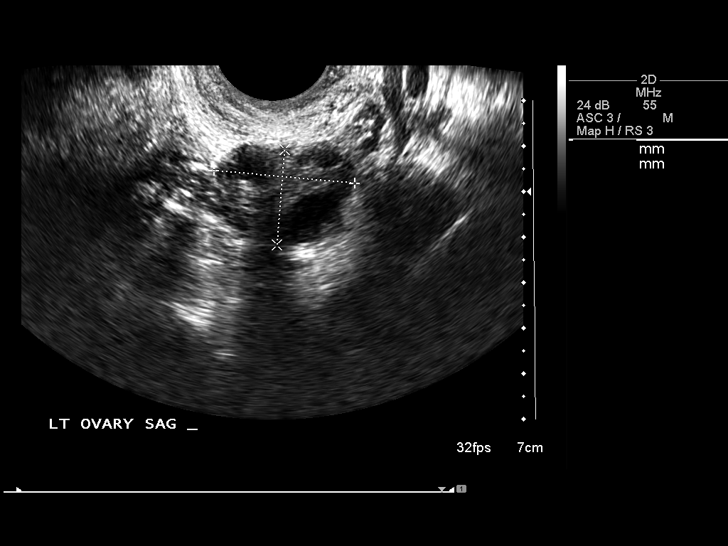
[im 50/67]
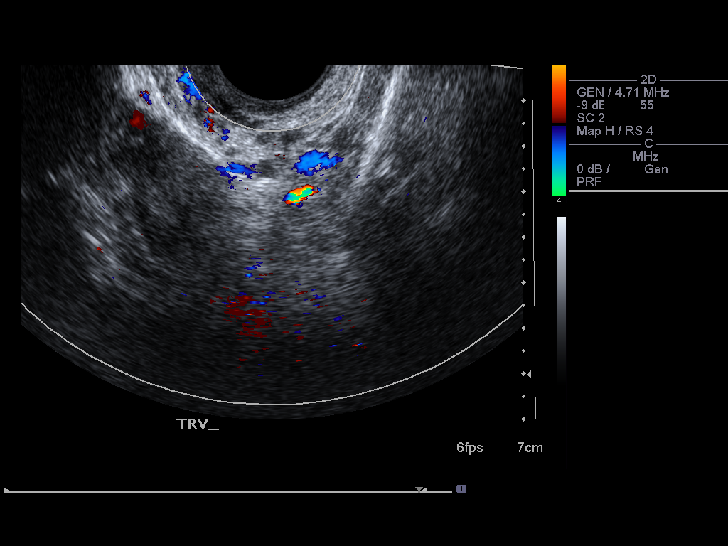
[im 56/67]
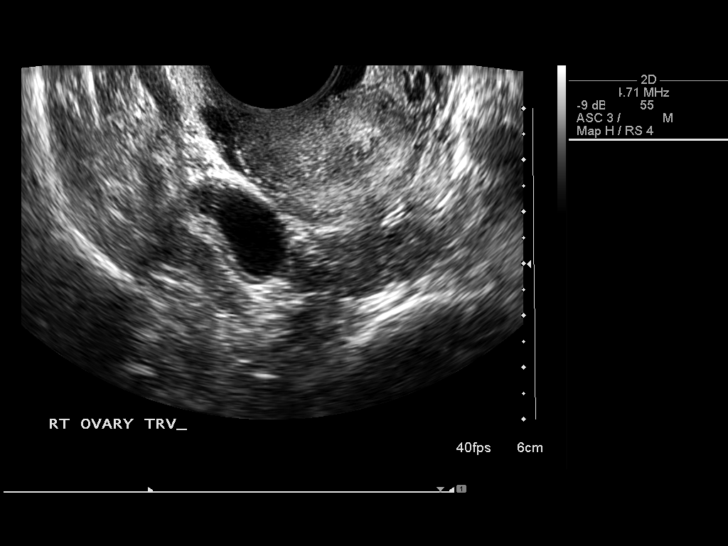
[im 61/67]
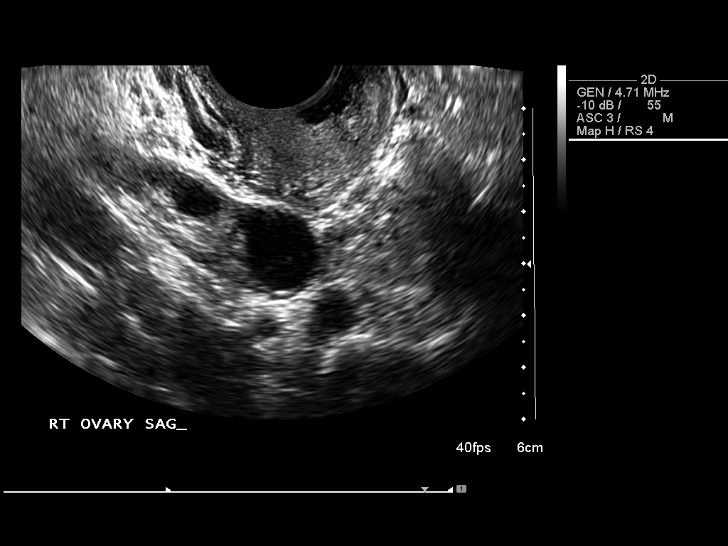
[im 67/67]
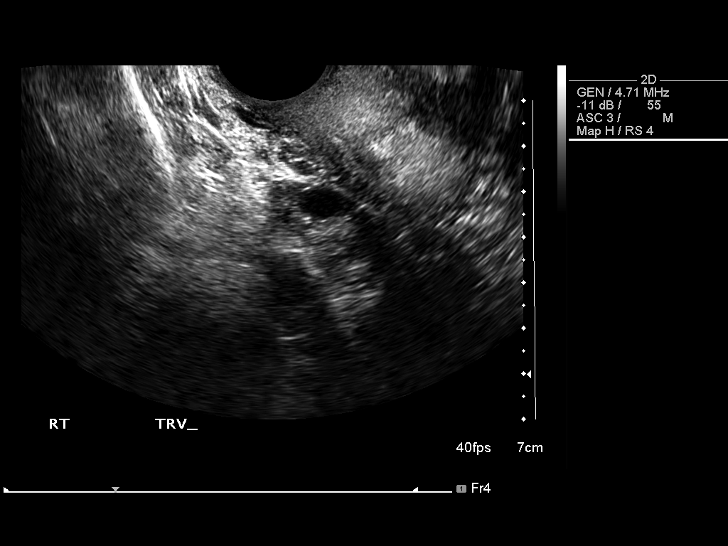

[14 of 25 positions shown; findings below may reference images not displayed]

FINDINGS: There are several cystic structures in the region of the
uterine cervix consistent with Nabothian cyst.

There is an enlarged fibroid uterus. The uterus measures 9.2 x
x 6.25 cm.  Within the left anterior wall of the uterus, in the
region of the fundus, there is a exophytic fibroid which measures
3.3 x 2.6 x 2.5 cm.  Within the anterior uterine fundus there is an
intramural fibroid measuring 1.3 x 1.4 x 1.6 cm.

The endometrium appears normal measuring 7 mm.

The left ovary appears normal measuring 3.1 x 2.1 x 2.5 cm.

There is a 1.4 cm cyst within the right ovary.  The right ovary
measures 4.1 x 1.7 x 2.0 cm.

There is no free fluid within the pelvis
IMPRESSION: 1.  Enlarged fibroid uterus.
2.  Right ovarian cyst measuring 1.4 cm peri

## 2008-05-11 ENCOUNTER — Emergency Department (HOSPITAL_COMMUNITY): Admission: EM | Admit: 2008-05-11 | Discharge: 2008-05-11 | Payer: Self-pay | Admitting: Emergency Medicine

## 2008-05-11 IMAGING — CR DG CHEST 2V
2 series · 2 of 2 positions shown · non-contrast
Comparison: [DATE]

CLINICAL DATA: Fever/cough

CHEST - 2 VIEW

[view not recorded (1 of 2)]
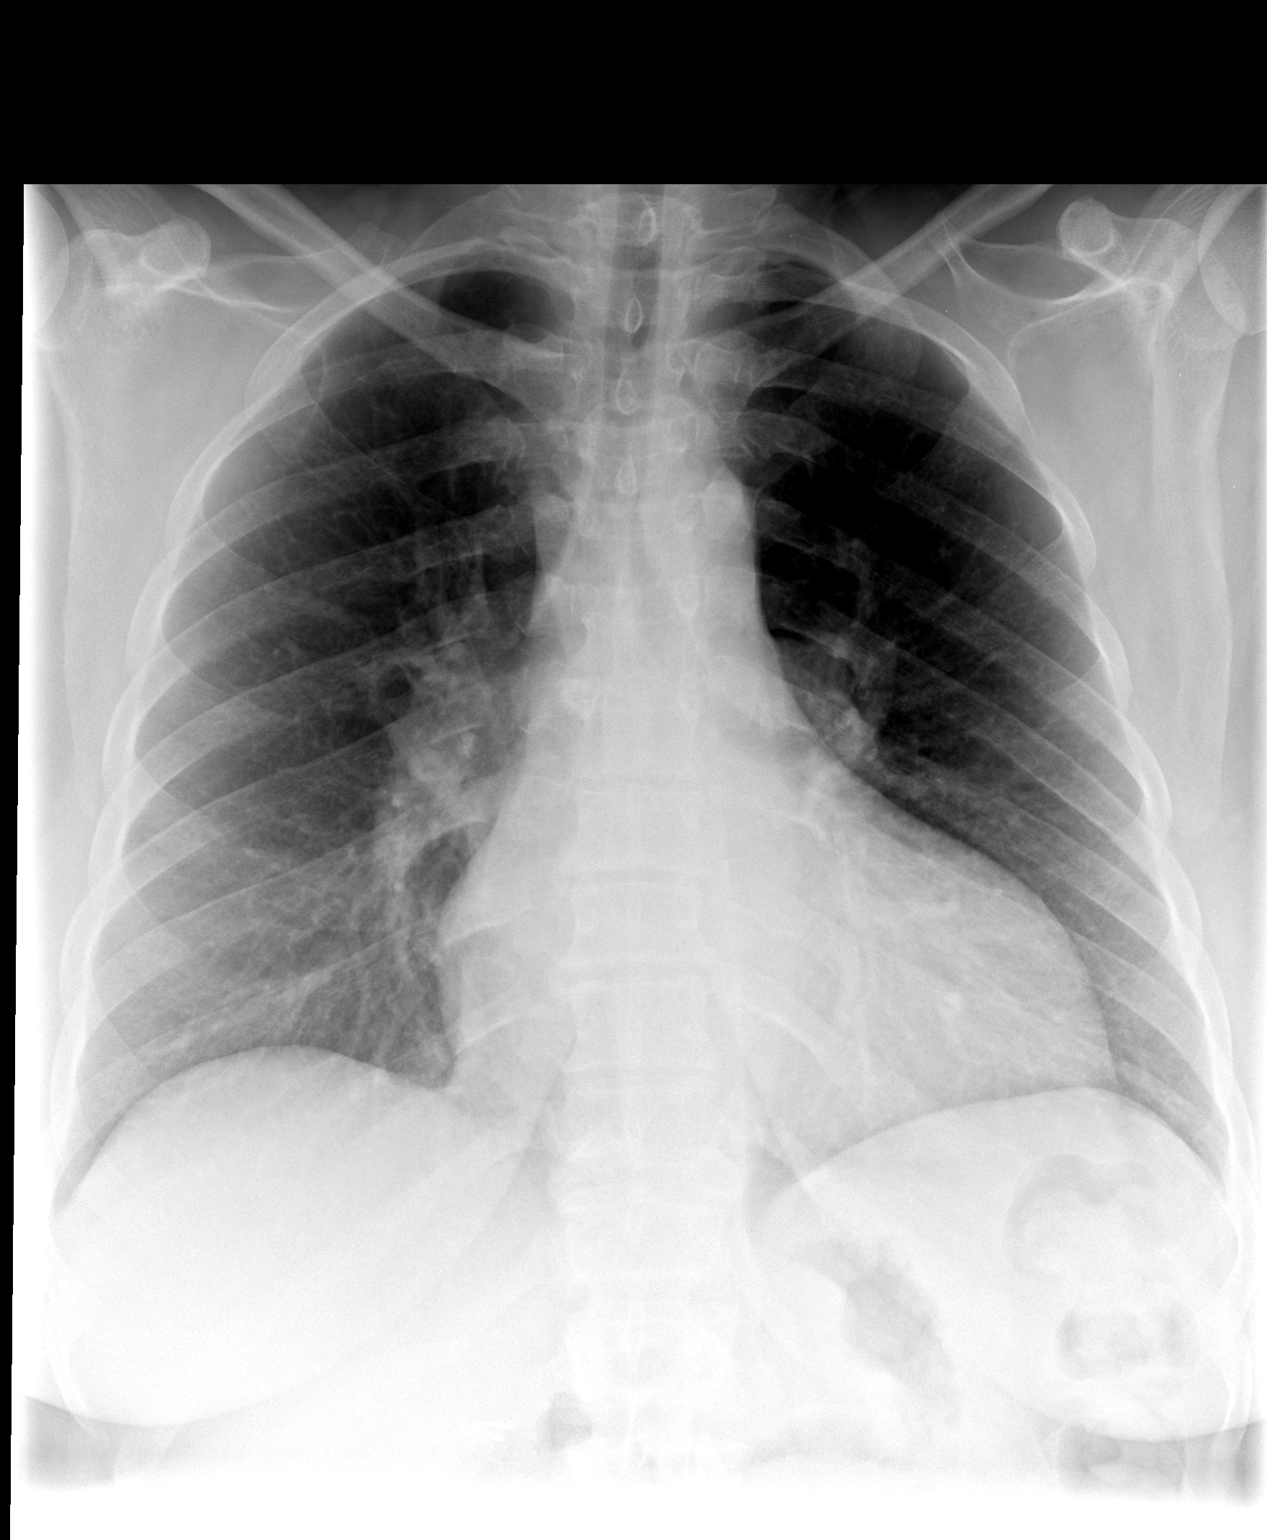

[view not recorded (2 of 2)]
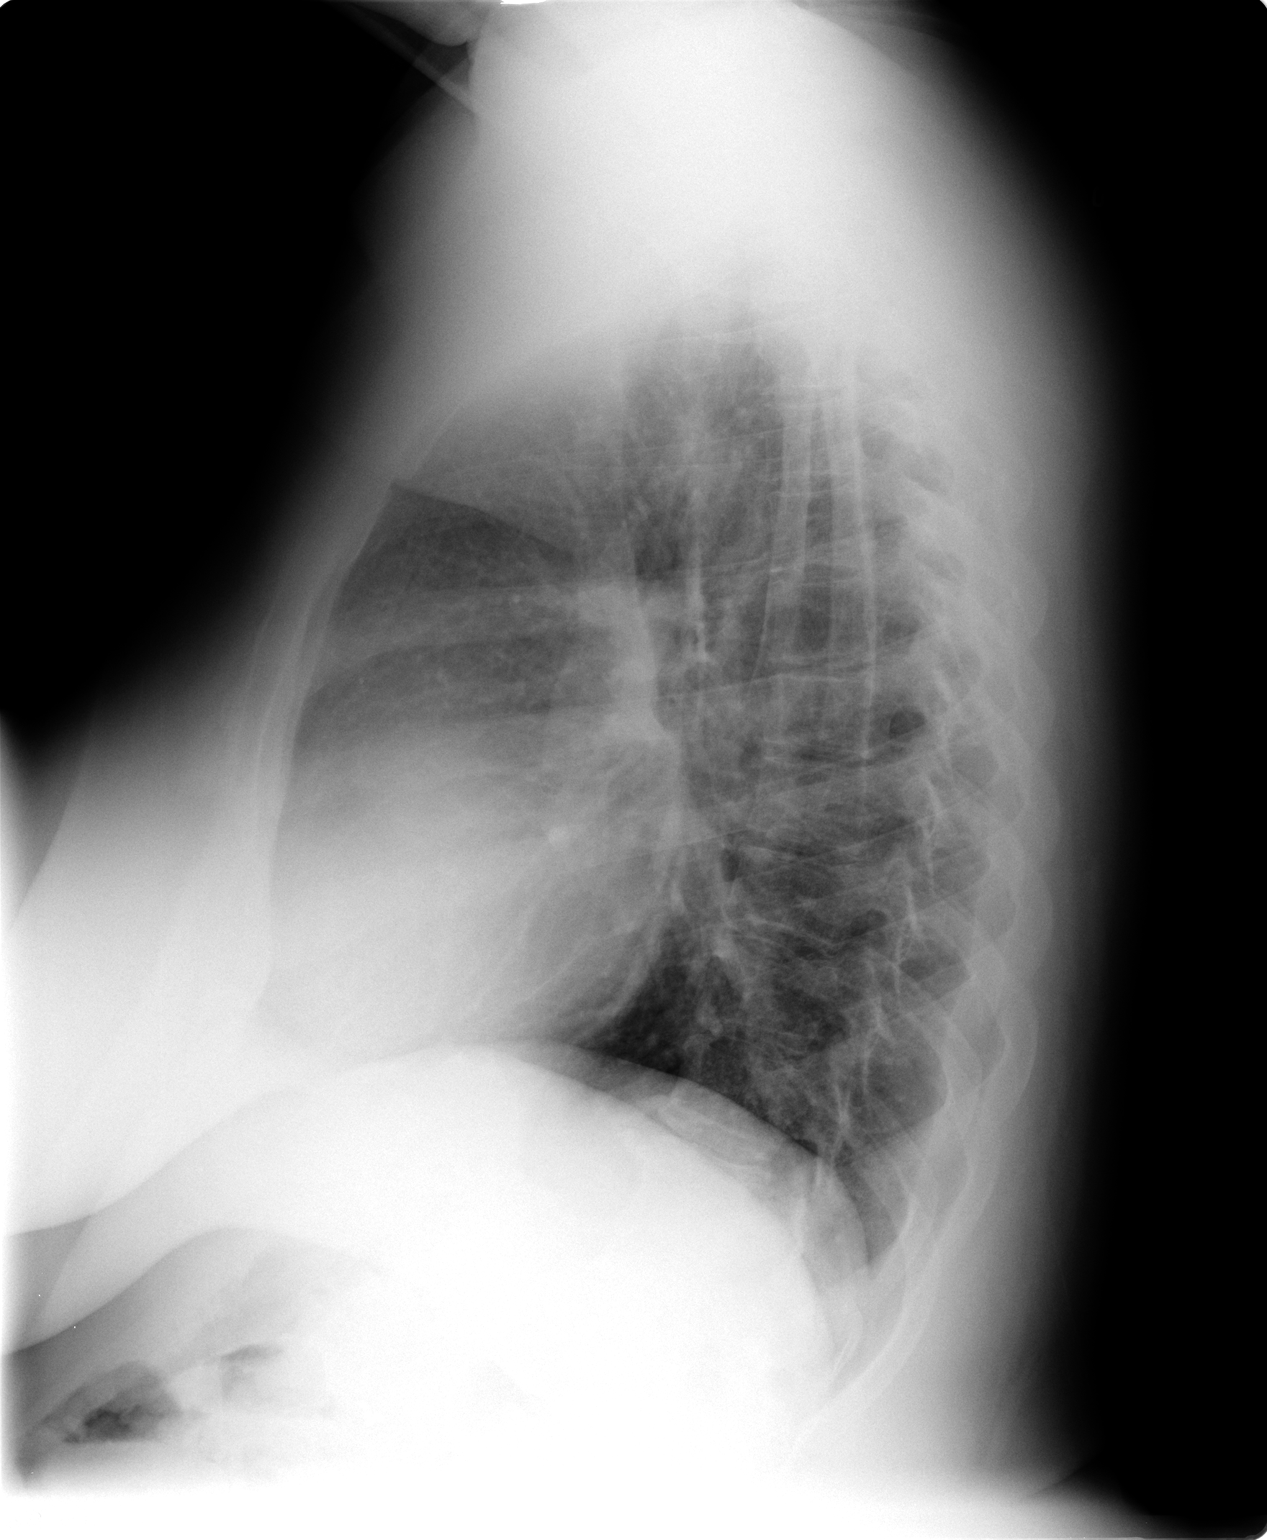

[2 of 2 positions shown; findings below may reference images not displayed]

FINDINGS: Heart enlarged without congestive heart failure.  Lungs
clear.  No pleural fluid or osseous lesions.
IMPRESSION: Stable cardiomegaly - no congestive heart failure or pneumonia.

## 2008-07-19 ENCOUNTER — Emergency Department (HOSPITAL_COMMUNITY): Admission: EM | Admit: 2008-07-19 | Discharge: 2008-07-19 | Payer: Self-pay | Admitting: Emergency Medicine

## 2008-07-21 ENCOUNTER — Ambulatory Visit (HOSPITAL_COMMUNITY): Admission: RE | Admit: 2008-07-21 | Discharge: 2008-07-21 | Payer: Self-pay | Admitting: Obstetrics and Gynecology

## 2008-08-22 ENCOUNTER — Emergency Department (HOSPITAL_COMMUNITY): Admission: EM | Admit: 2008-08-22 | Discharge: 2008-08-22 | Payer: Self-pay | Admitting: Emergency Medicine

## 2008-08-22 IMAGING — CR DG CHEST 2V
2 series · 2 of 2 positions shown · non-contrast
Comparison: [DATE].

CLINICAL DATA: Cough, fever and myalgias.

CHEST - 2 VIEW

[view not recorded (1 of 2)]
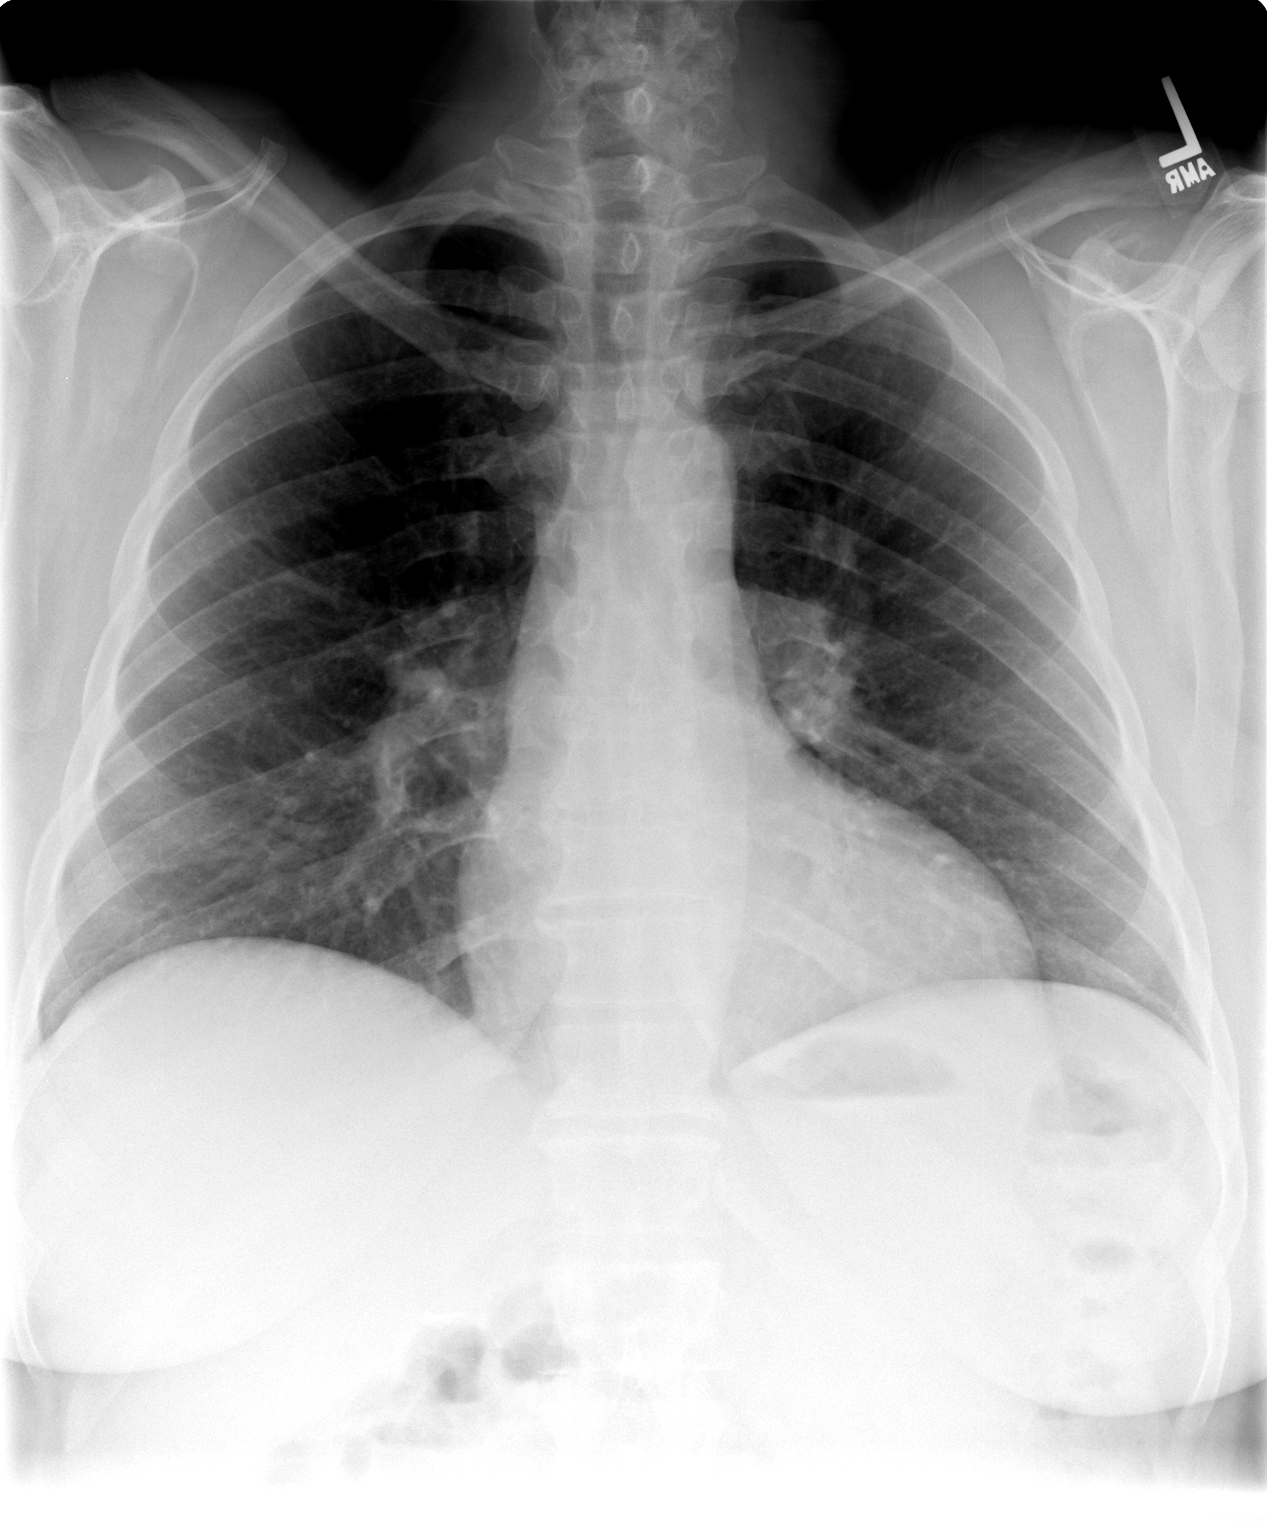

[view not recorded (2 of 2)]
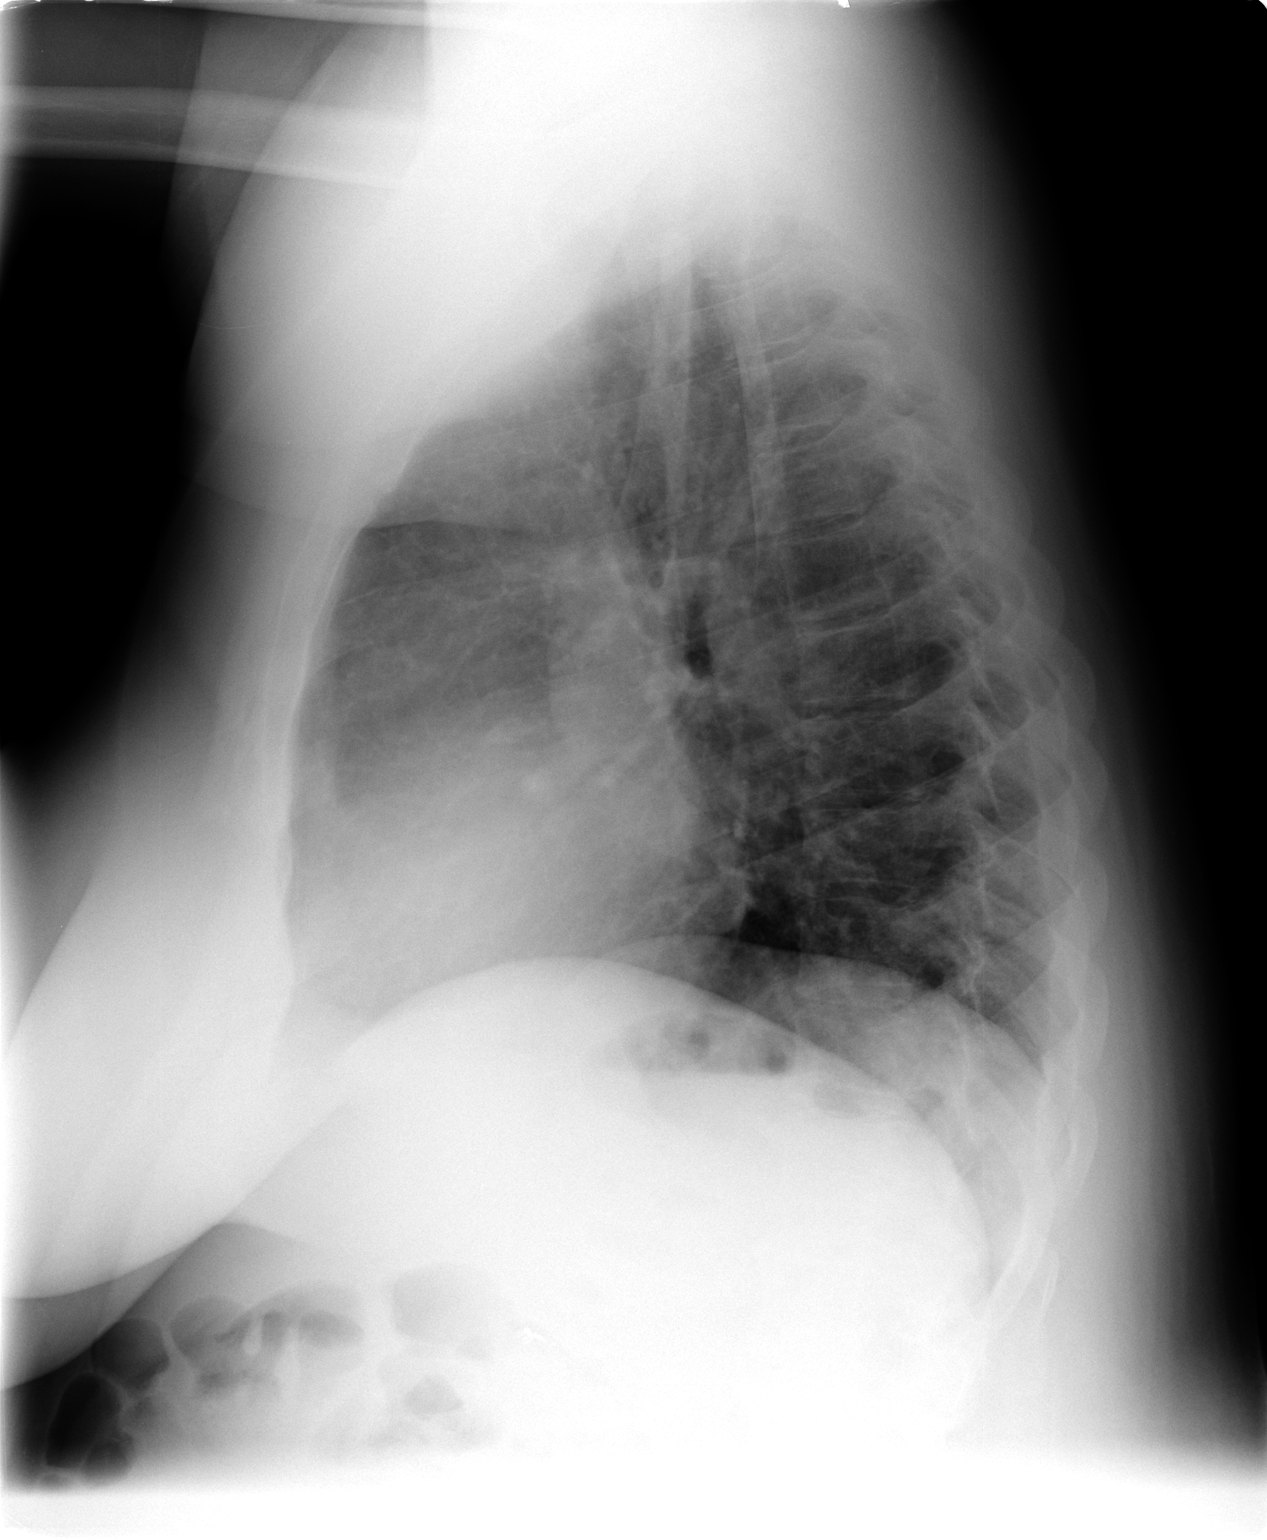

[2 of 2 positions shown; findings below may reference images not displayed]

FINDINGS: Borderline enlarged cardiac silhouette with an interval
decrease in the size.  Mild diffuse peribronchial thickening with
mild progression.  No airspace consolidation.  Thoracic spine
degenerative changes.  Cholecystectomy clips.
IMPRESSION: 1.  Mild bronchitic changes with mild progression.
2.  Borderline cardiomegaly, improved.

## 2008-09-27 ENCOUNTER — Emergency Department (HOSPITAL_COMMUNITY): Admission: EM | Admit: 2008-09-27 | Discharge: 2008-09-27 | Payer: Self-pay | Admitting: Emergency Medicine

## 2008-09-27 IMAGING — CR DG CHEST 1V PORT
1 series · 1 of 1 positions shown · non-contrast
Comparison: Chest film [DATE].

CLINICAL DATA: Chest pain.  Left arm pain.

PORTABLE CHEST - 1 VIEW

[view not recorded]
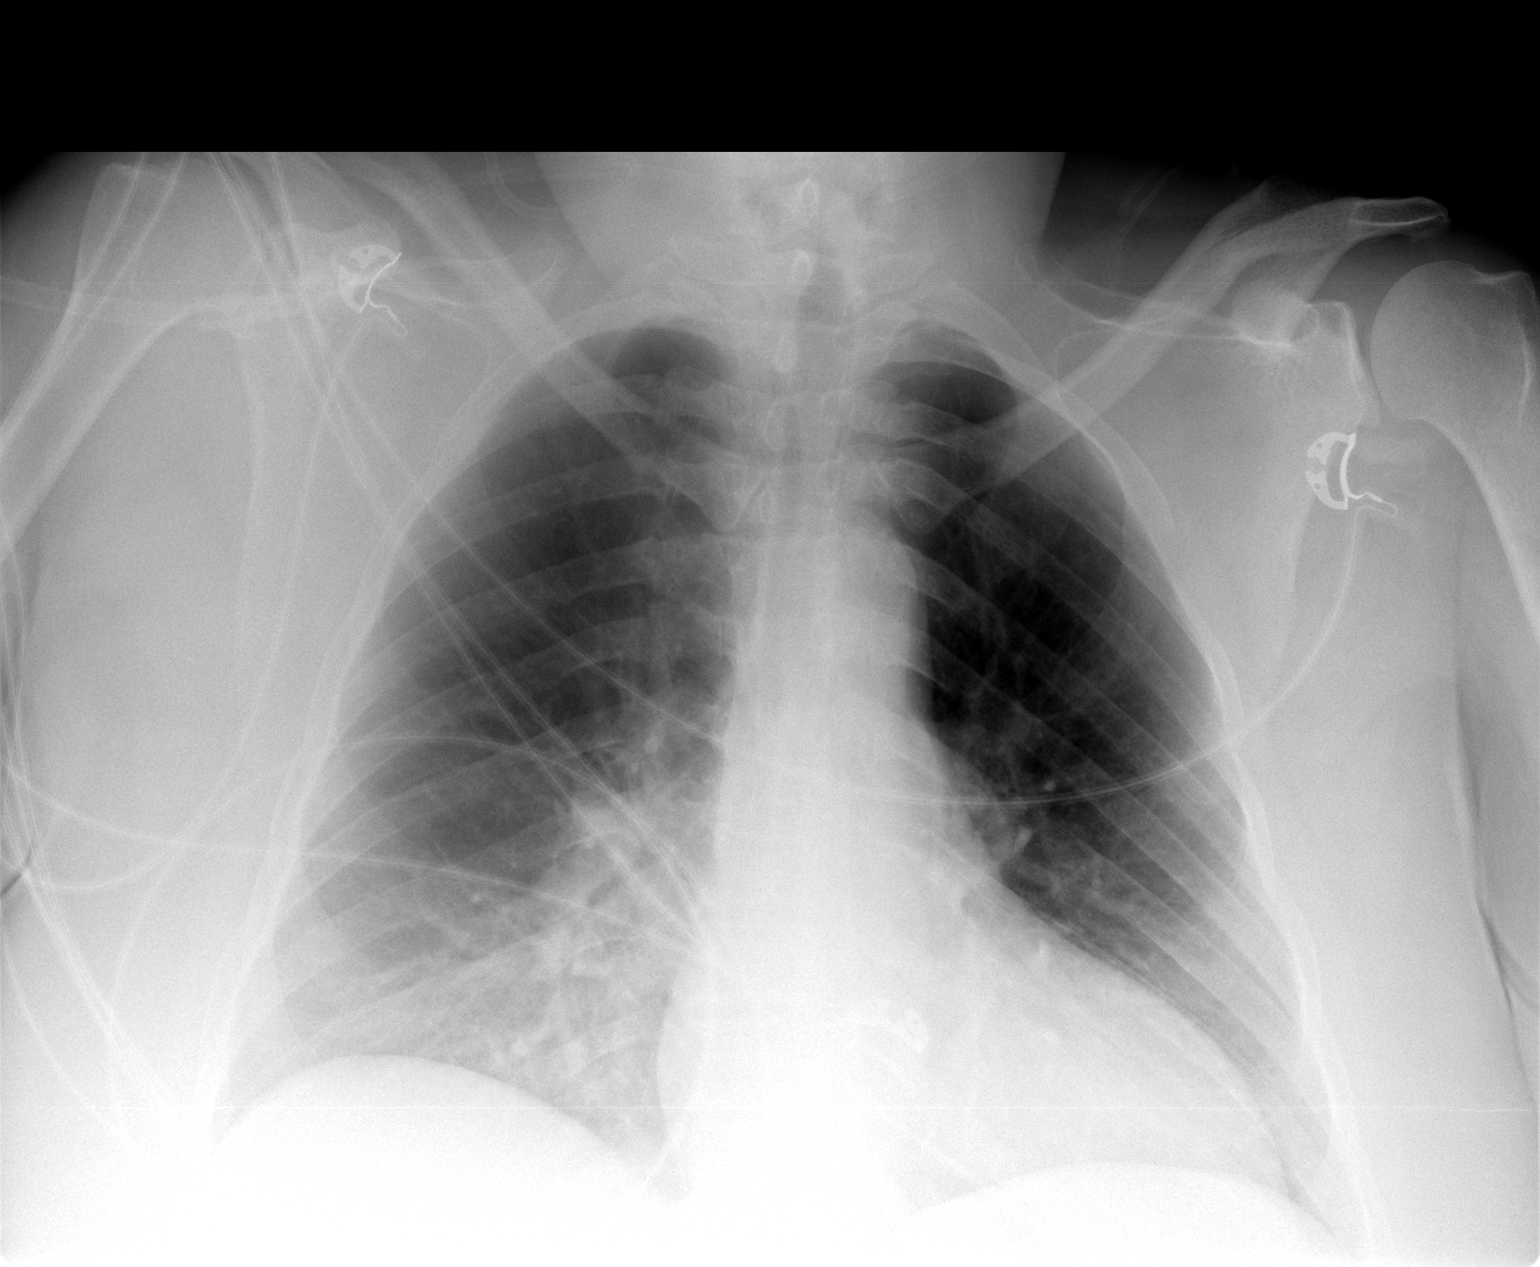

[1 of 1 positions shown; findings below may reference images not displayed]

FINDINGS: There is some right basilar atelectasis.  Lungs otherwise
clear.  Heart size normal.
IMPRESSION: Right basilar atelectasis.  Otherwise negative.

## 2008-09-29 ENCOUNTER — Ambulatory Visit: Payer: Self-pay | Admitting: Cardiology

## 2008-09-29 ENCOUNTER — Encounter (HOSPITAL_COMMUNITY): Admission: RE | Admit: 2008-09-29 | Discharge: 2008-10-29 | Payer: Self-pay | Admitting: Cardiology

## 2008-09-30 ENCOUNTER — Ambulatory Visit: Payer: Self-pay | Admitting: Cardiology

## 2008-10-04 ENCOUNTER — Ambulatory Visit: Payer: Self-pay | Admitting: Cardiology

## 2008-10-04 ENCOUNTER — Ambulatory Visit (HOSPITAL_COMMUNITY): Admission: RE | Admit: 2008-10-04 | Discharge: 2008-10-04 | Payer: Self-pay | Admitting: Cardiology

## 2008-10-25 ENCOUNTER — Emergency Department (HOSPITAL_COMMUNITY): Admission: EM | Admit: 2008-10-25 | Discharge: 2008-10-25 | Payer: Self-pay | Admitting: Emergency Medicine

## 2008-10-25 IMAGING — CR DG CHEST 1V PORT
1 series · 1 of 1 positions shown · non-contrast
Comparison: Portable exam [G9] hours compared to [DATE]

CLINICAL DATA: Chest pain, smoker

PORTABLE CHEST - 1 VIEW

[view not recorded]
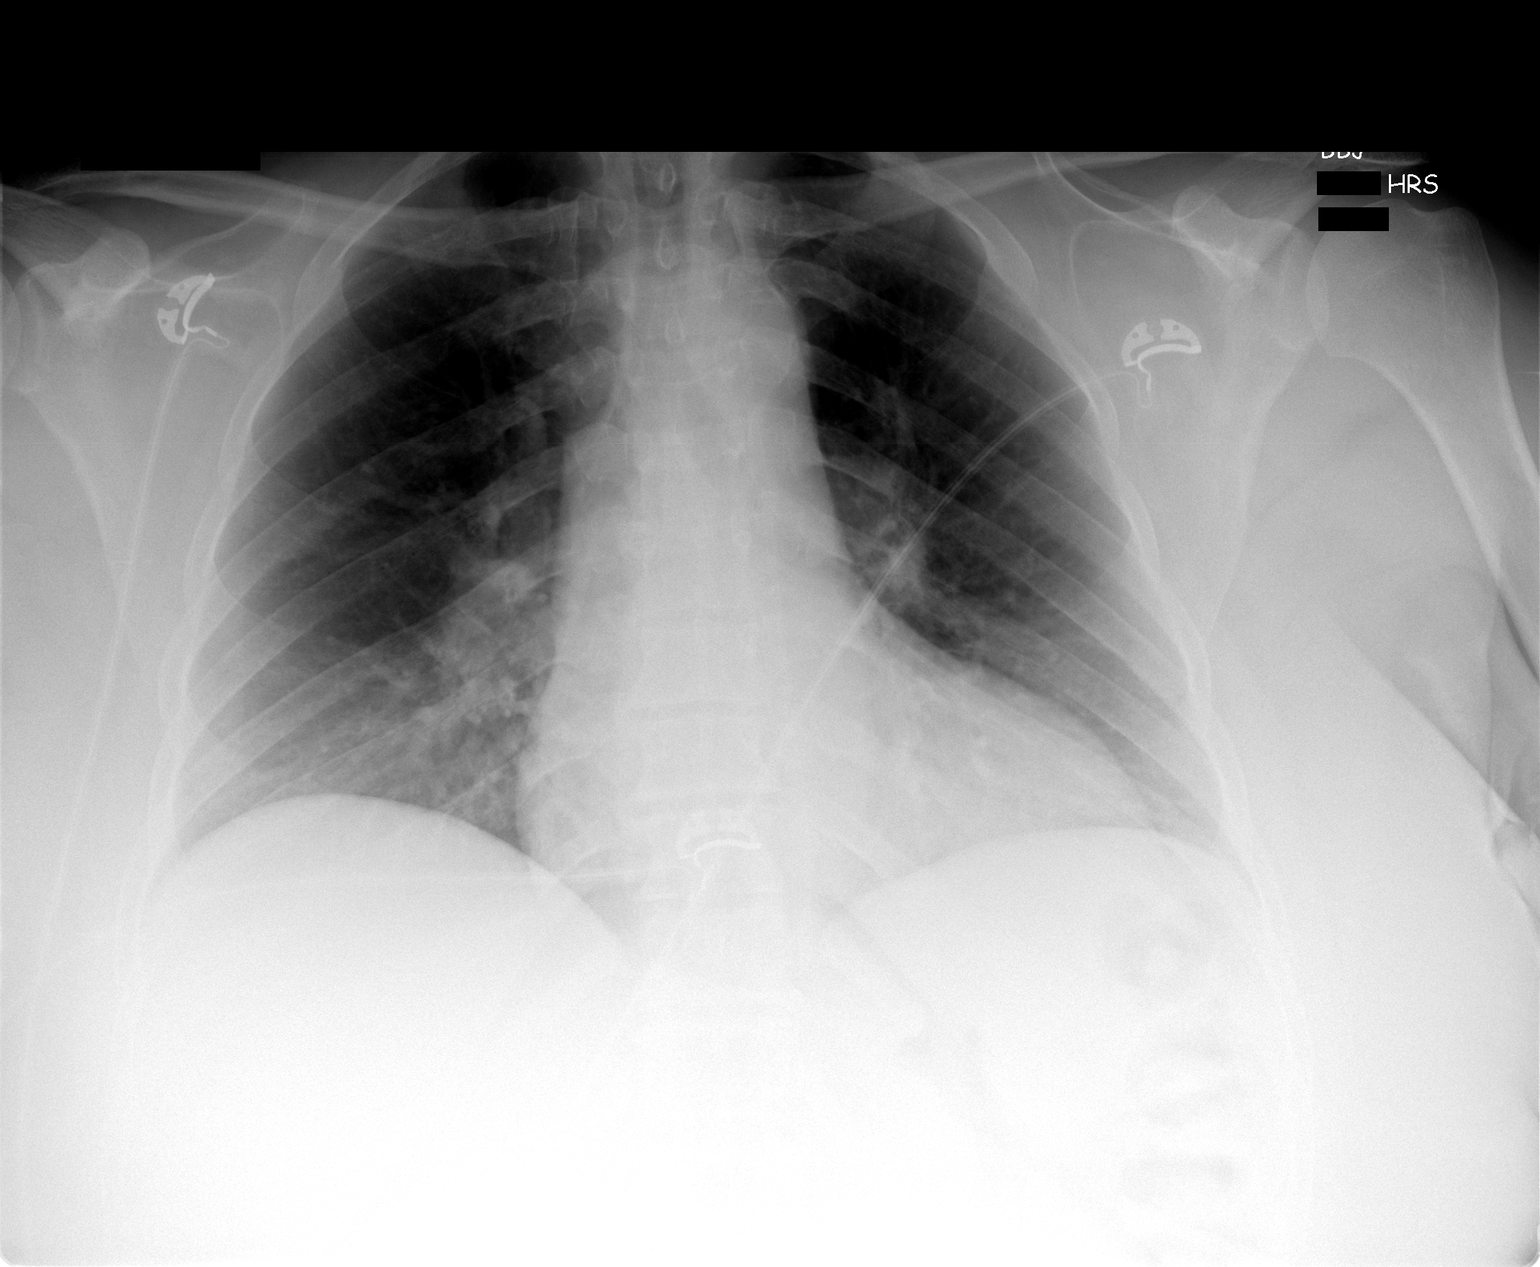

[1 of 1 positions shown; findings below may reference images not displayed]

FINDINGS: Mild cardiac enlargement.
Normal mediastinal contours and pulmonary vascularity.
Mild atherosclerotic calcification at the aortic arch.
Minimal right basilar atelectasis without pulmonary infiltrate or
pleural effusion.
No pneumothorax.
Bones unremarkable.
IMPRESSION: Cardiomegaly with minimal right basilar atelectasis.

## 2009-07-09 ENCOUNTER — Emergency Department (HOSPITAL_COMMUNITY): Admission: EM | Admit: 2009-07-09 | Discharge: 2009-07-09 | Payer: Self-pay | Admitting: Emergency Medicine

## 2009-07-09 IMAGING — CR DG KNEE COMPLETE 4+V*R*
4 series · 4 of 4 positions shown · non-contrast
Comparison: None

CLINICAL DATA: Right knee pain and swelling.

RIGHT KNEE - COMPLETE 4+ VIEW

[view not recorded (1 of 4)]
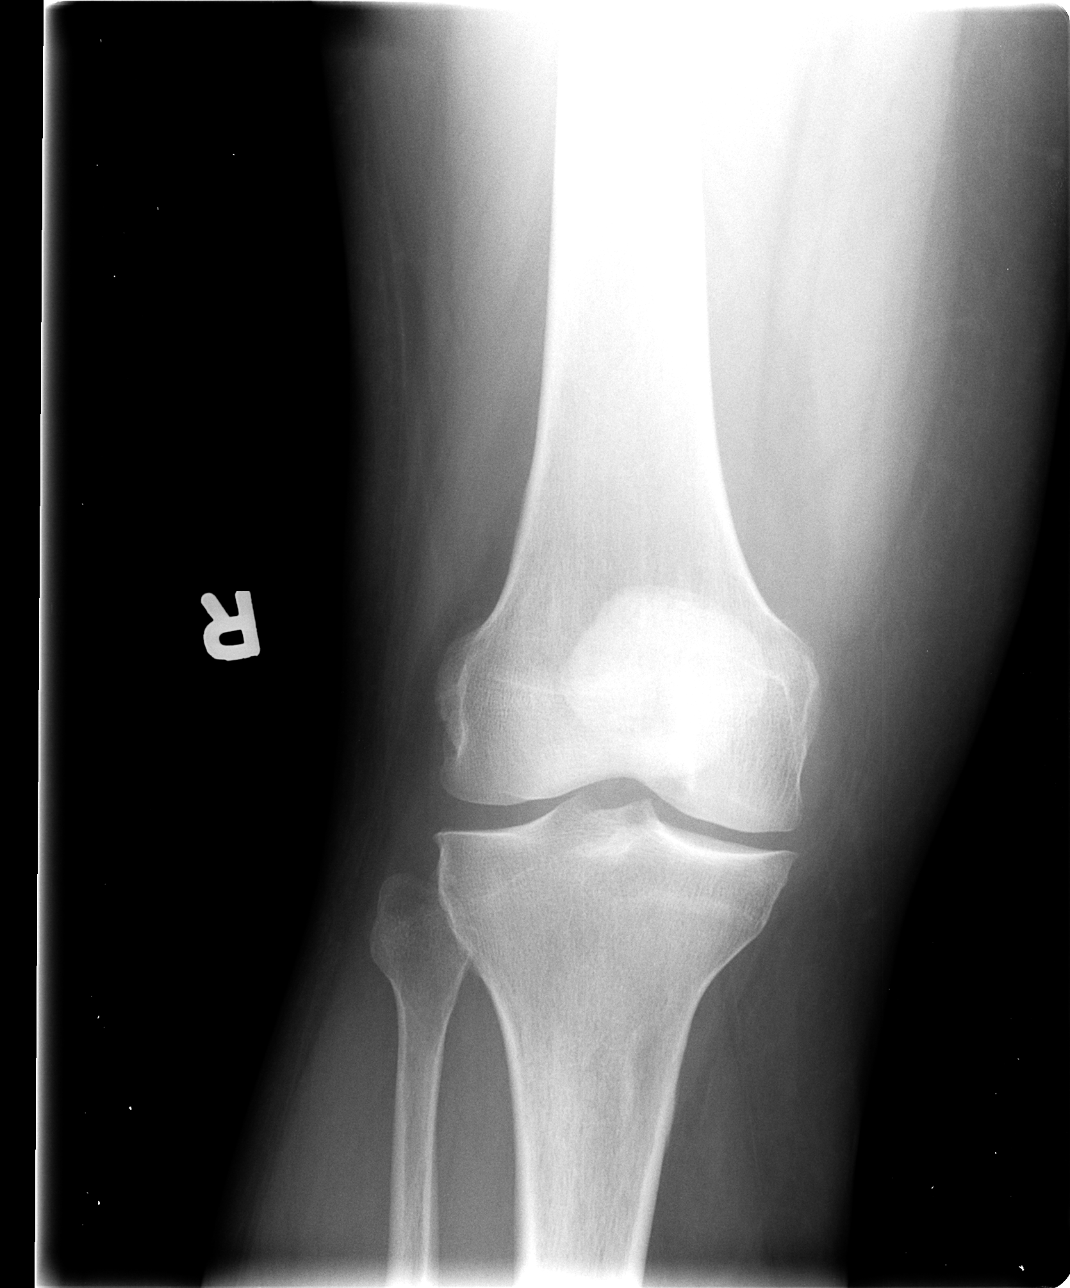

[view not recorded (2 of 4)]
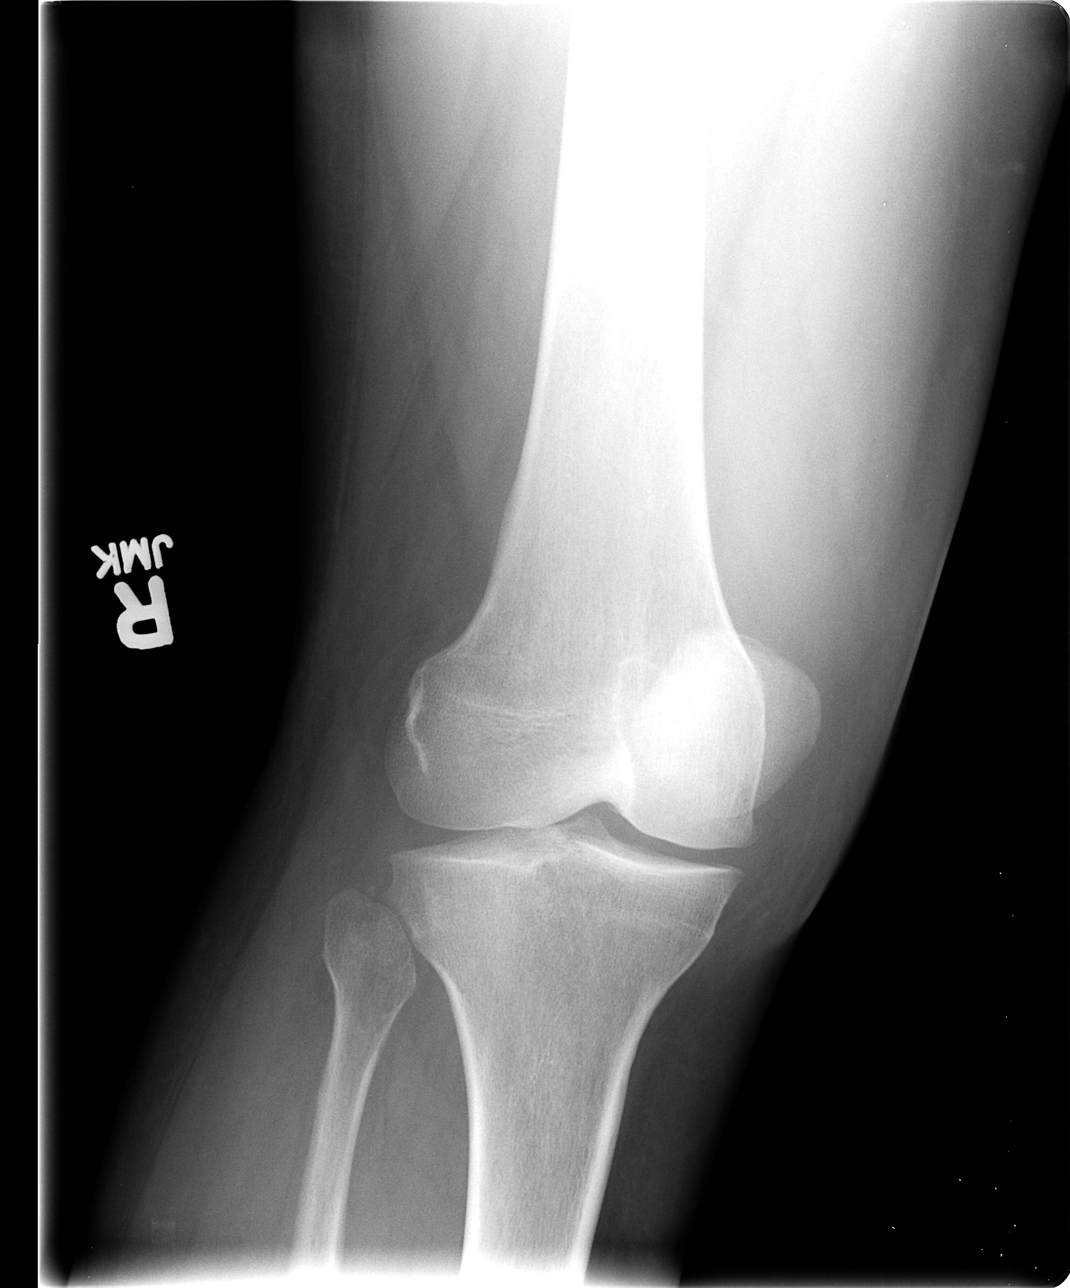

[view not recorded (3 of 4)]
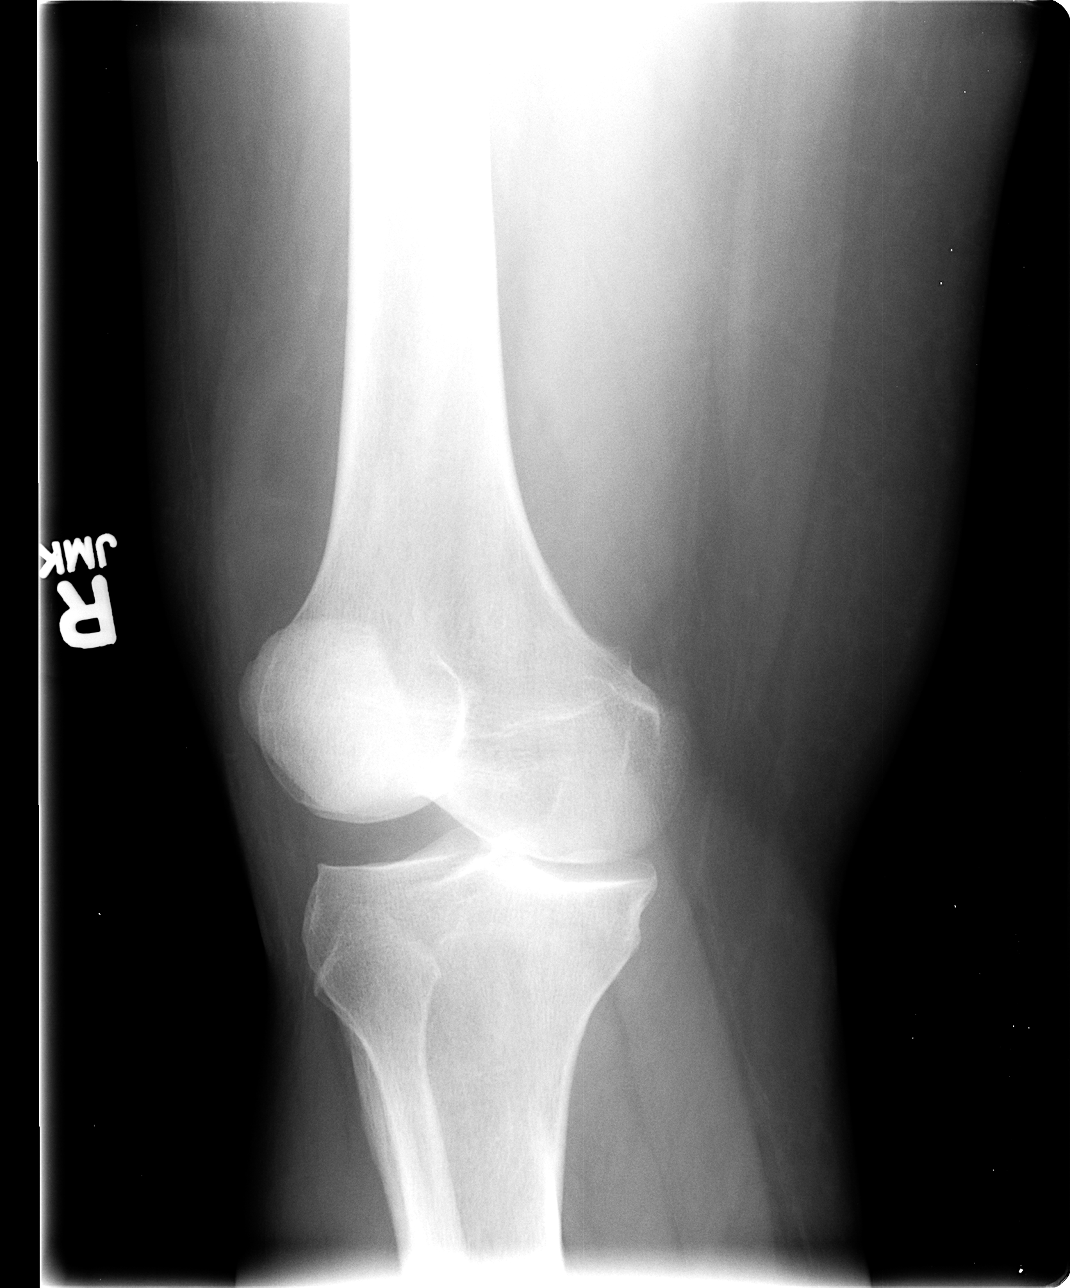

[view not recorded (4 of 4)]
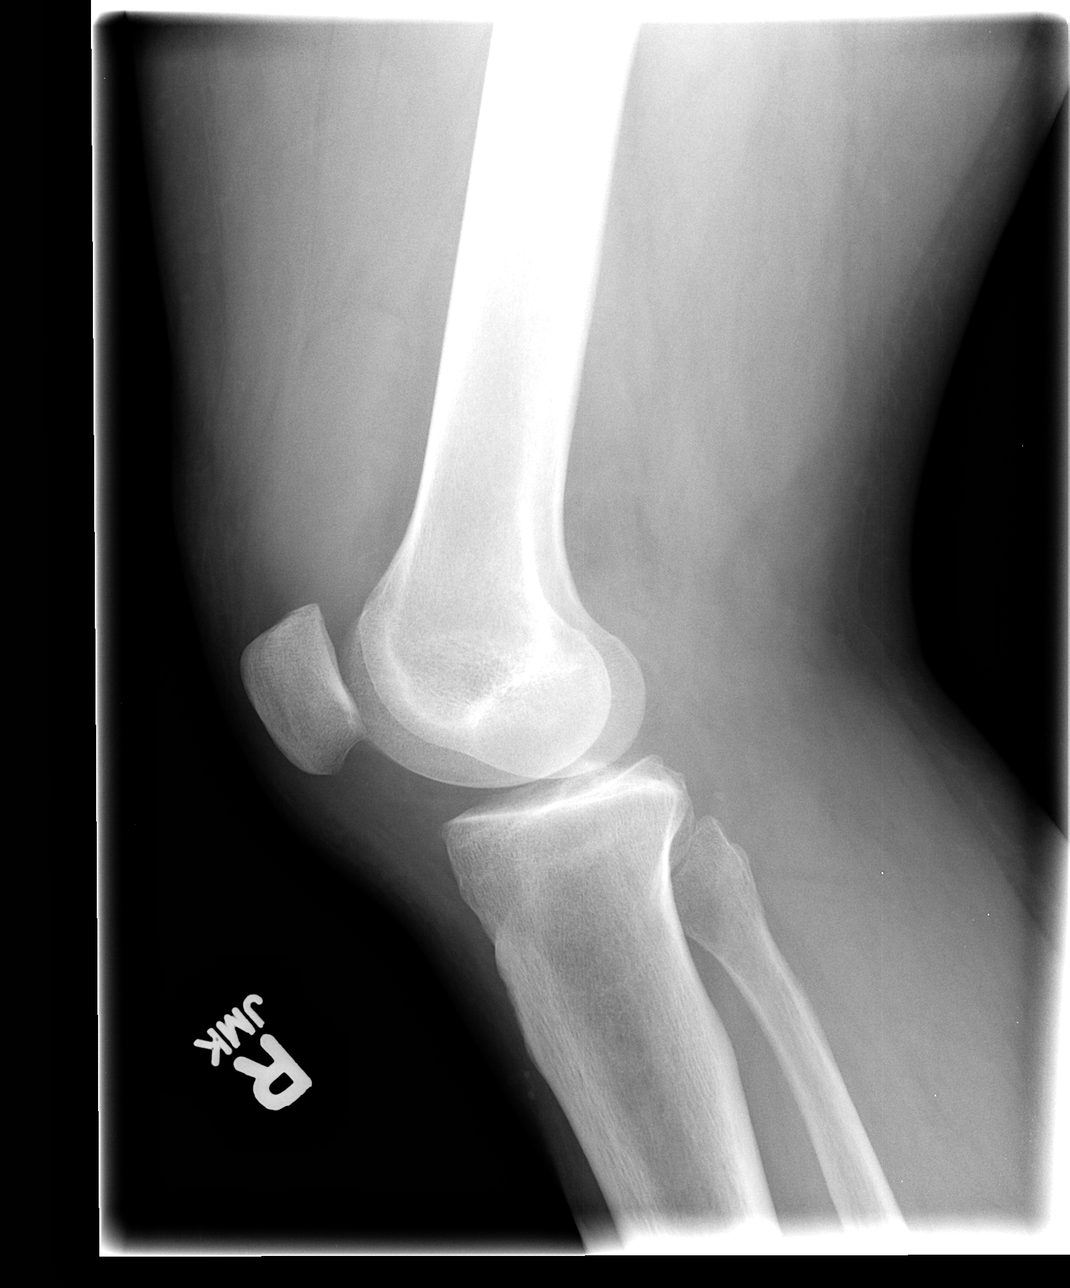

[4 of 4 positions shown; findings below may reference images not displayed]

FINDINGS: Mild medial joint space narrowing present.  There is a
significant suprapatellar joint effusion.  No other bony
abnormalities.
IMPRESSION: Mild medial joint space narrowing.  Suprapatellar joint effusion.

## 2009-08-29 ENCOUNTER — Encounter (HOSPITAL_COMMUNITY): Admission: RE | Admit: 2009-08-29 | Discharge: 2009-09-28 | Payer: Self-pay | Admitting: Orthopaedic Surgery

## 2009-09-29 ENCOUNTER — Emergency Department (HOSPITAL_COMMUNITY): Admission: EM | Admit: 2009-09-29 | Discharge: 2009-09-29 | Payer: Self-pay | Admitting: Emergency Medicine

## 2009-12-07 ENCOUNTER — Encounter (INDEPENDENT_AMBULATORY_CARE_PROVIDER_SITE_OTHER): Payer: Self-pay | Admitting: *Deleted

## 2010-01-30 ENCOUNTER — Ambulatory Visit (HOSPITAL_COMMUNITY): Admission: RE | Admit: 2010-01-30 | Discharge: 2010-01-30 | Payer: Self-pay | Admitting: Family Medicine

## 2010-02-20 ENCOUNTER — Encounter (INDEPENDENT_AMBULATORY_CARE_PROVIDER_SITE_OTHER): Payer: Self-pay | Admitting: *Deleted

## 2010-02-23 ENCOUNTER — Emergency Department (HOSPITAL_COMMUNITY): Admission: EM | Admit: 2010-02-23 | Discharge: 2010-02-23 | Payer: Self-pay | Admitting: Emergency Medicine

## 2010-02-23 IMAGING — CR DG CHEST 2V
2 series · 2 of 2 positions shown · non-contrast
Comparison: [DATE]

CLINICAL DATA: Cough, diabetes, hypertension, smoker

CHEST - 2 VIEW

[view not recorded (1 of 2)]
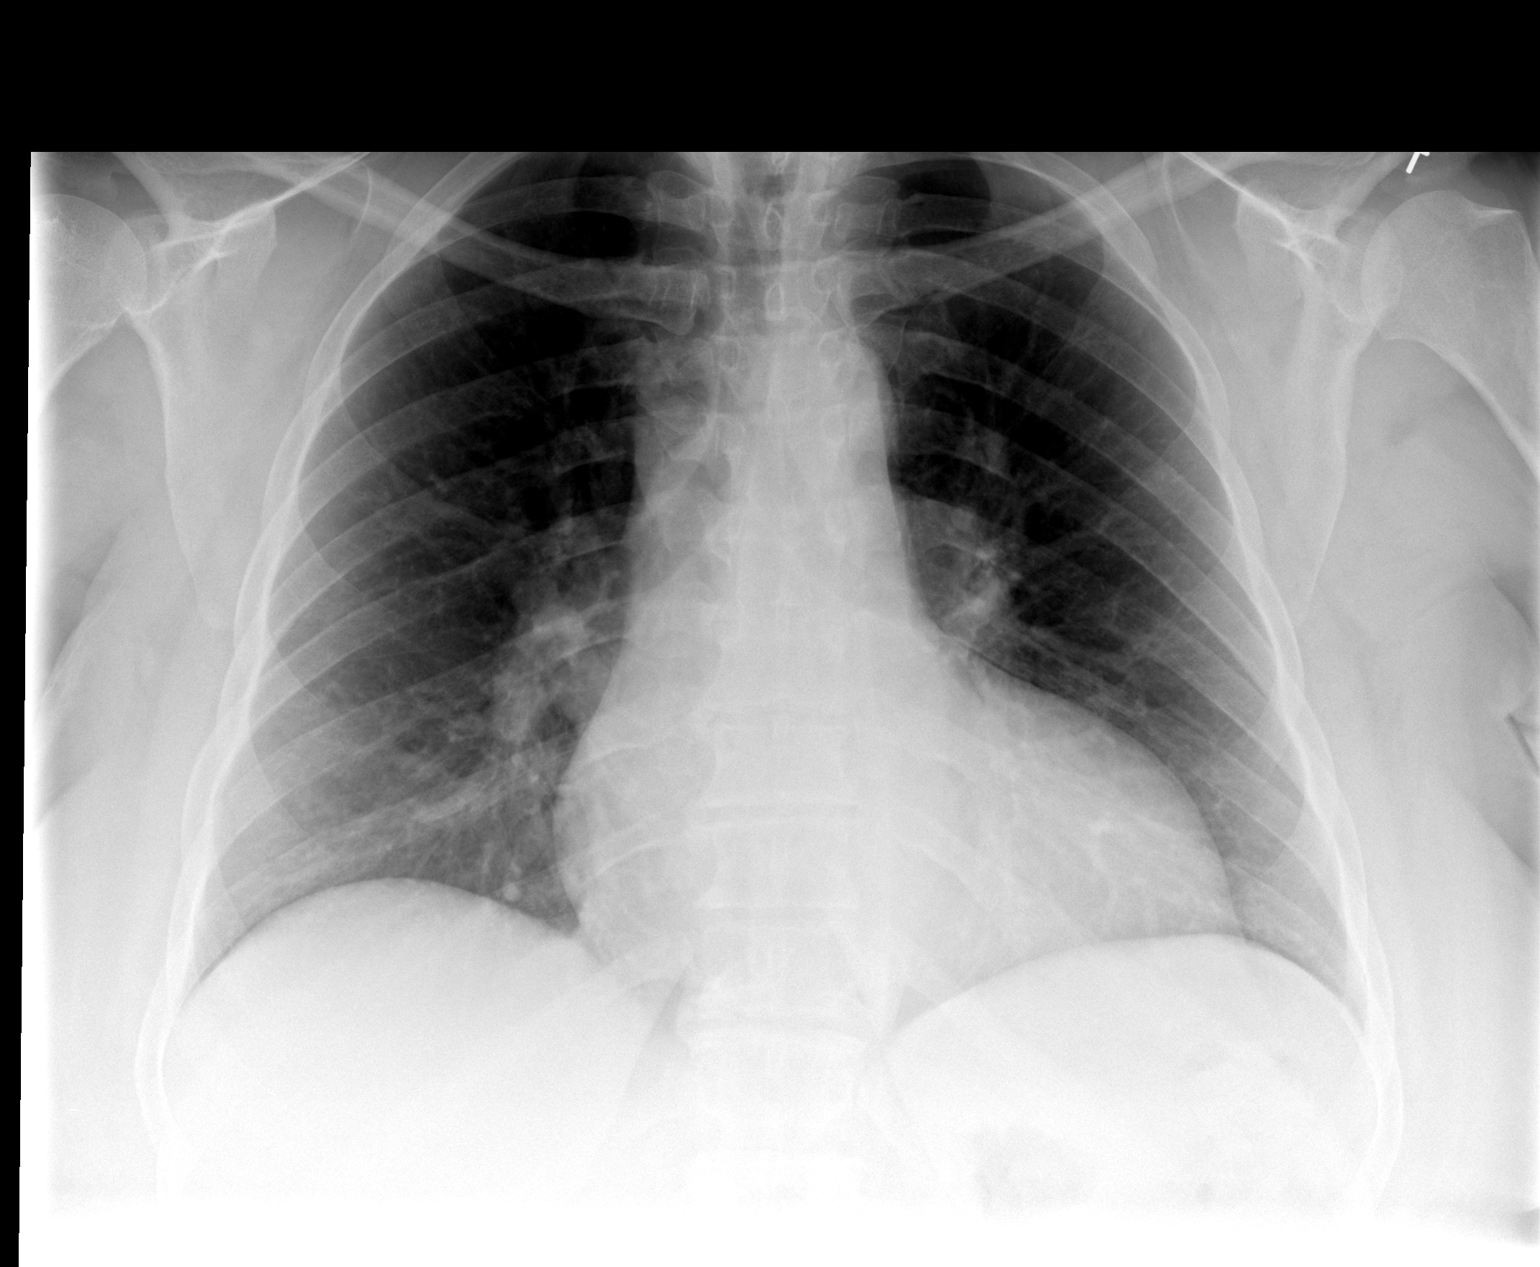

[view not recorded (2 of 2)]
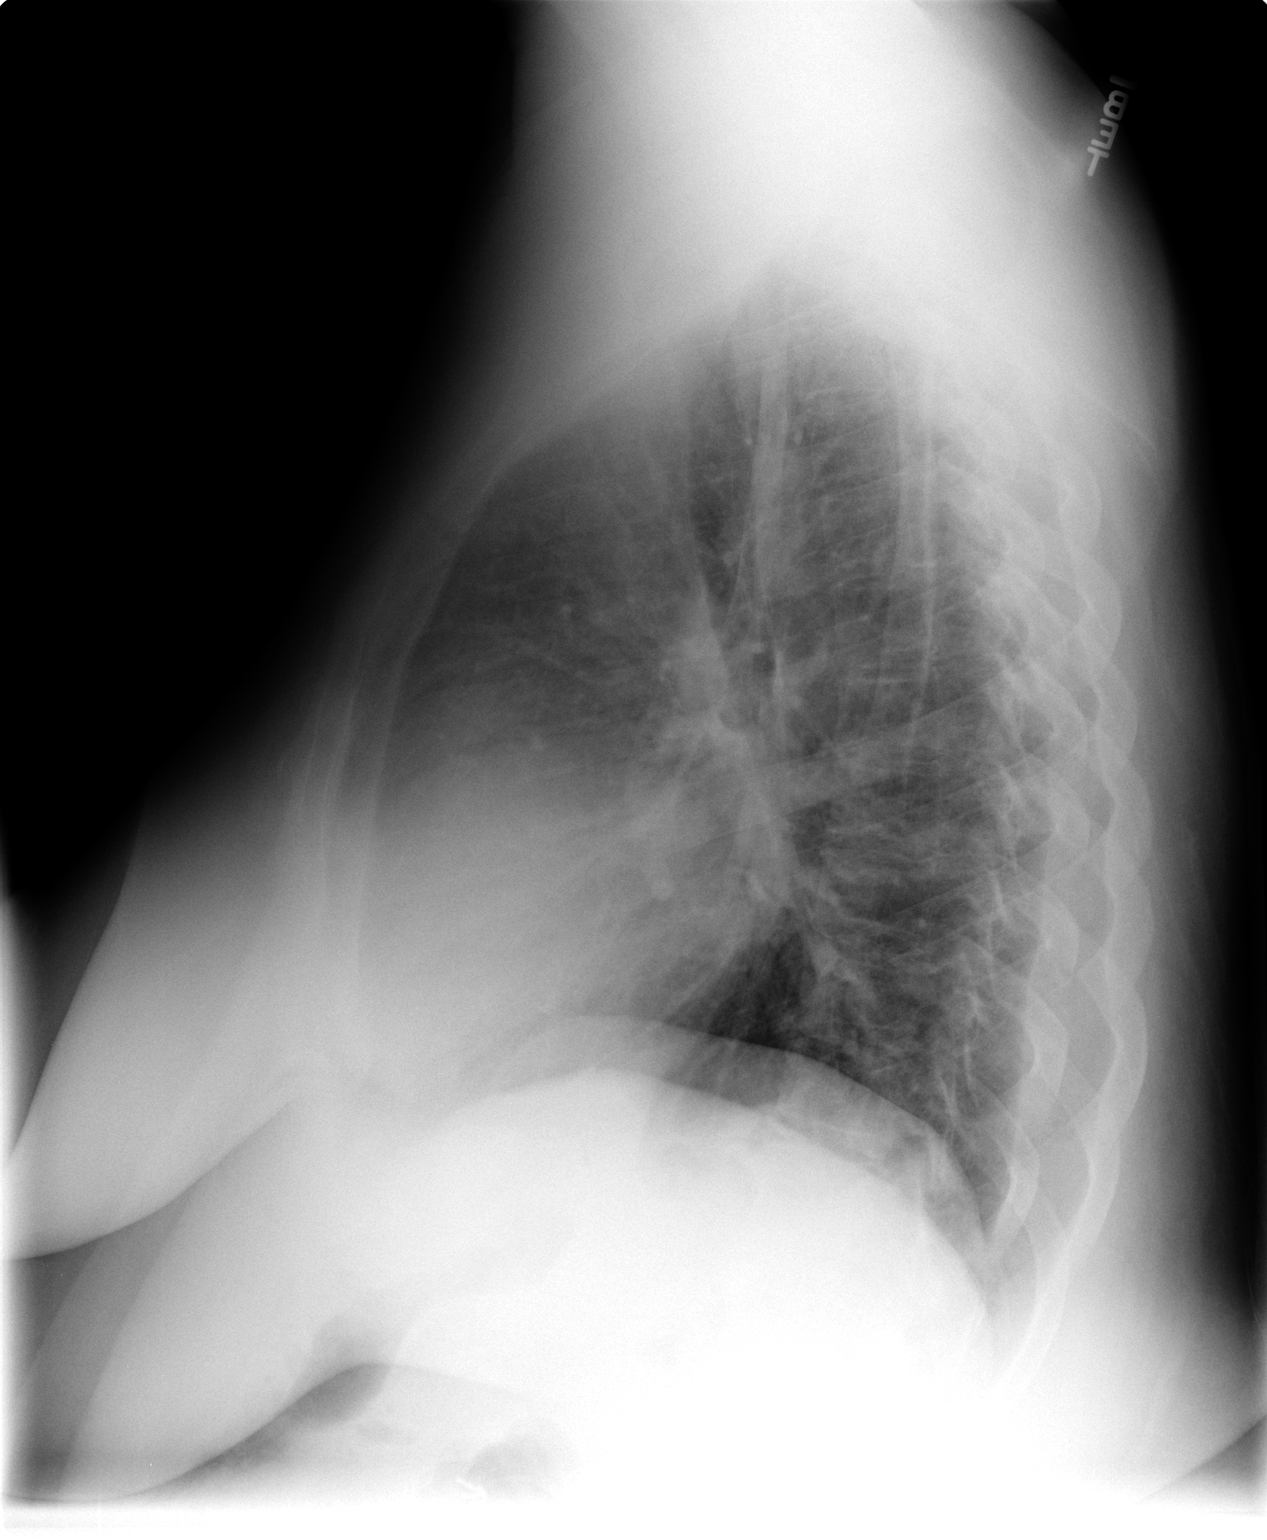

[2 of 2 positions shown; findings below may reference images not displayed]

FINDINGS: Cardiac enlargement.
Tortuous thoracic aorta.
Mediastinal contours and pulmonary vascularity normal.
Lungs clear.
Bones unremarkable.
IMPRESSION: Cardiomegaly.
No acute abnormalities.

## 2010-04-12 ENCOUNTER — Emergency Department (HOSPITAL_COMMUNITY): Admission: EM | Admit: 2010-04-12 | Discharge: 2010-04-13 | Payer: Self-pay | Admitting: Emergency Medicine

## 2010-04-12 ENCOUNTER — Emergency Department (HOSPITAL_COMMUNITY): Admission: EM | Admit: 2010-04-12 | Discharge: 2010-04-12 | Payer: Self-pay | Admitting: Emergency Medicine

## 2010-04-12 IMAGING — CR DG CHEST 2V
2 series · 2 of 2 positions shown · non-contrast
Comparison: Chest x-ray of [DATE]

CLINICAL DATA: Fever, congestion, cough

CHEST - 2 VIEW

[view not recorded (1 of 2)]
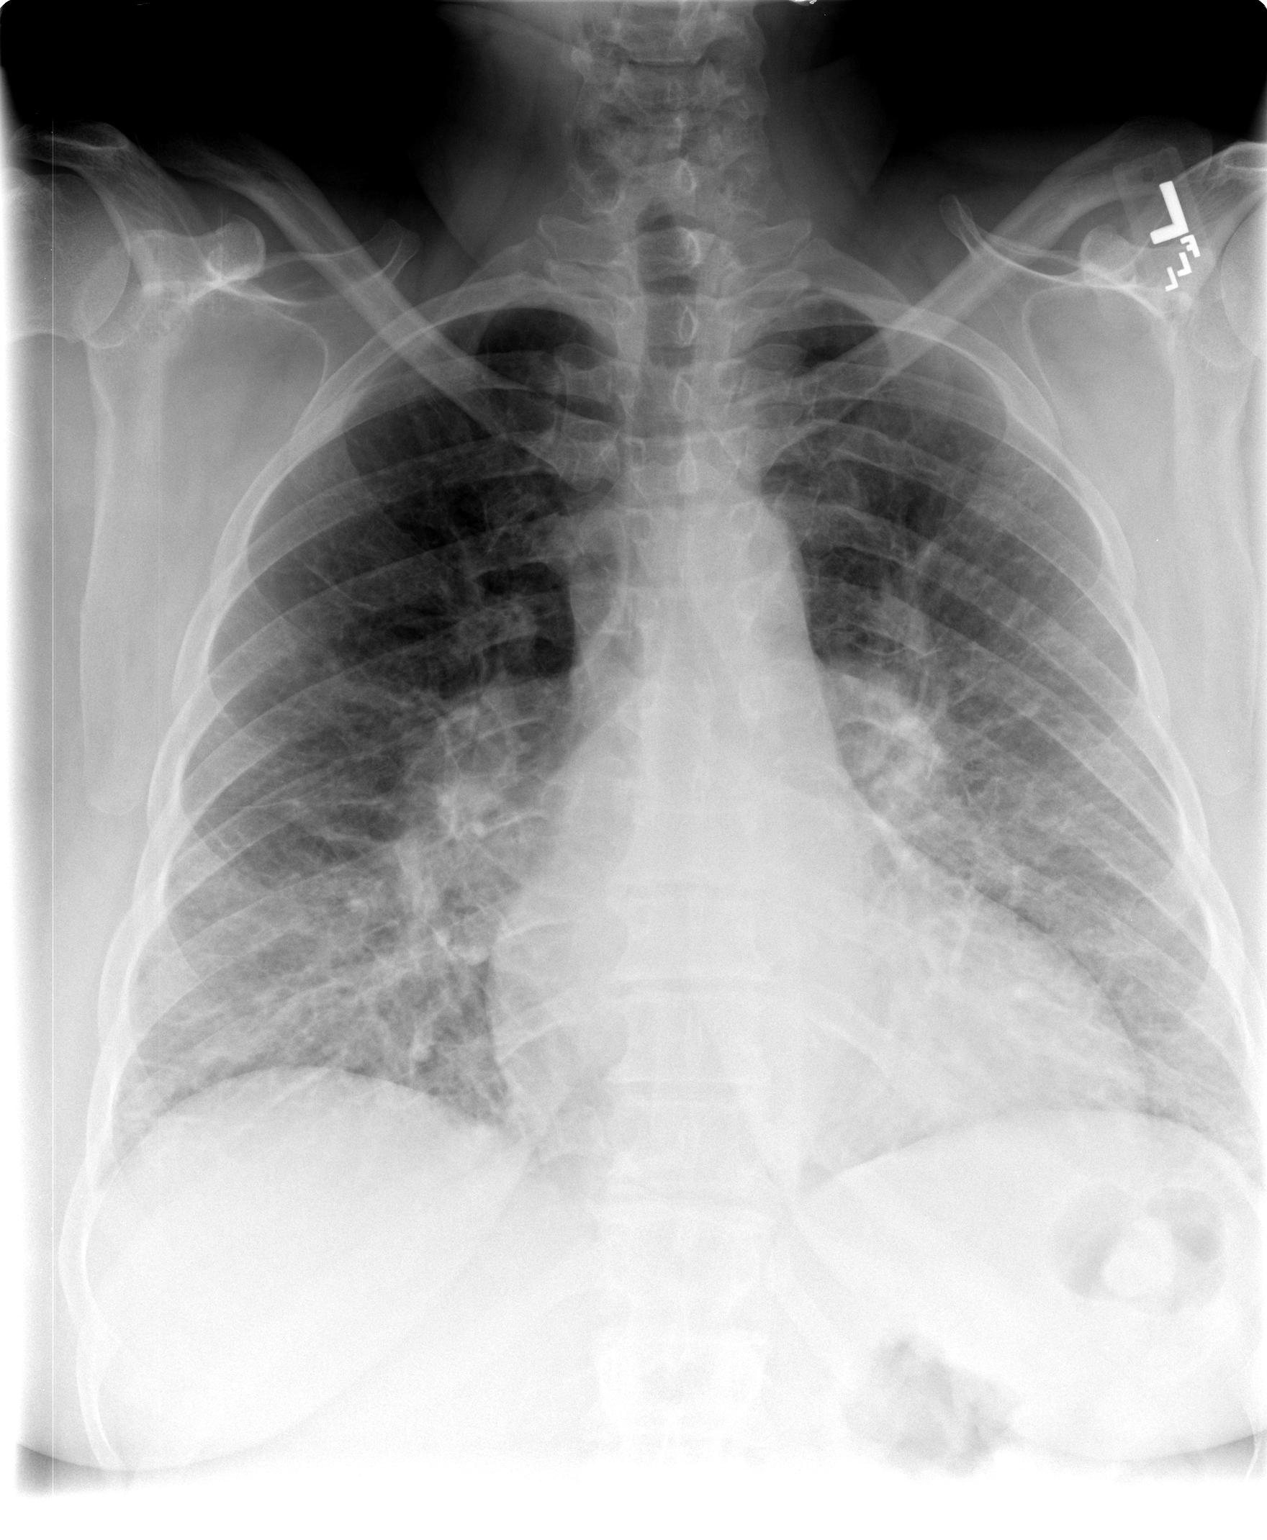

[view not recorded (2 of 2)]
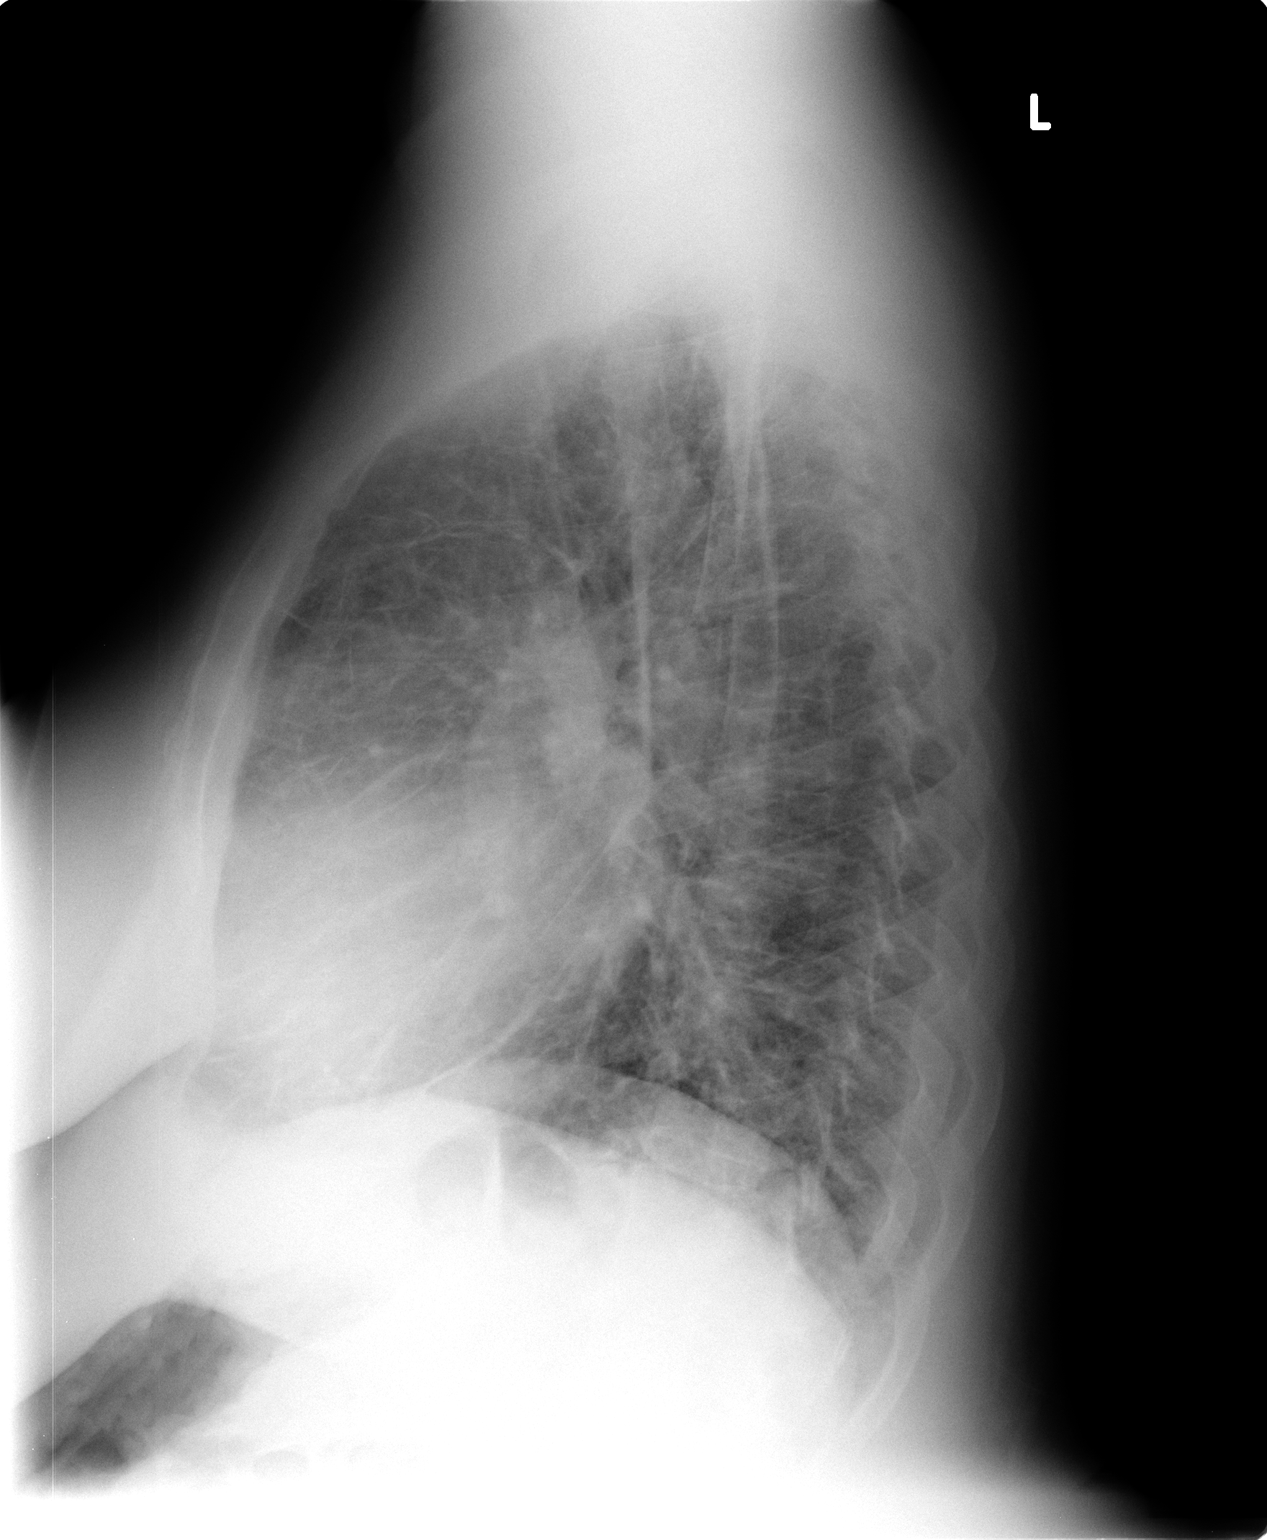

[2 of 2 positions shown; findings below may reference images not displayed]

FINDINGS: There are prominent interstitial markings particularly in
the lower lung fields.  There also somewhat prominent markings
throughout the remainder of the lungs which are less prominent.
The considerations are that of pneumonia as possibly a viral
process, versus interstitial edema.  No effusion is seen.  The
heart is mildly enlarged.  No bony abnormality is noted.
IMPRESSION: Prominent interstitial markings as noted above.  Consider pneumonia
such as a viral pneumonia versus interstitial edema.  Mild
cardiomegaly.

## 2010-05-23 ENCOUNTER — Encounter (HOSPITAL_COMMUNITY): Admission: RE | Admit: 2010-05-23 | Payer: Self-pay | Admitting: Orthopaedic Surgery

## 2010-06-18 ENCOUNTER — Emergency Department (HOSPITAL_COMMUNITY): Admission: EM | Admit: 2010-06-18 | Discharge: 2010-06-18 | Payer: Self-pay | Admitting: Emergency Medicine

## 2010-06-18 IMAGING — CR DG CHEST 2V
2 series · 2 of 2 positions shown · non-contrast
Comparison: [DATE]

CLINICAL DATA: Cough and fever

CHEST - 2 VIEW

[view not recorded (1 of 2)]
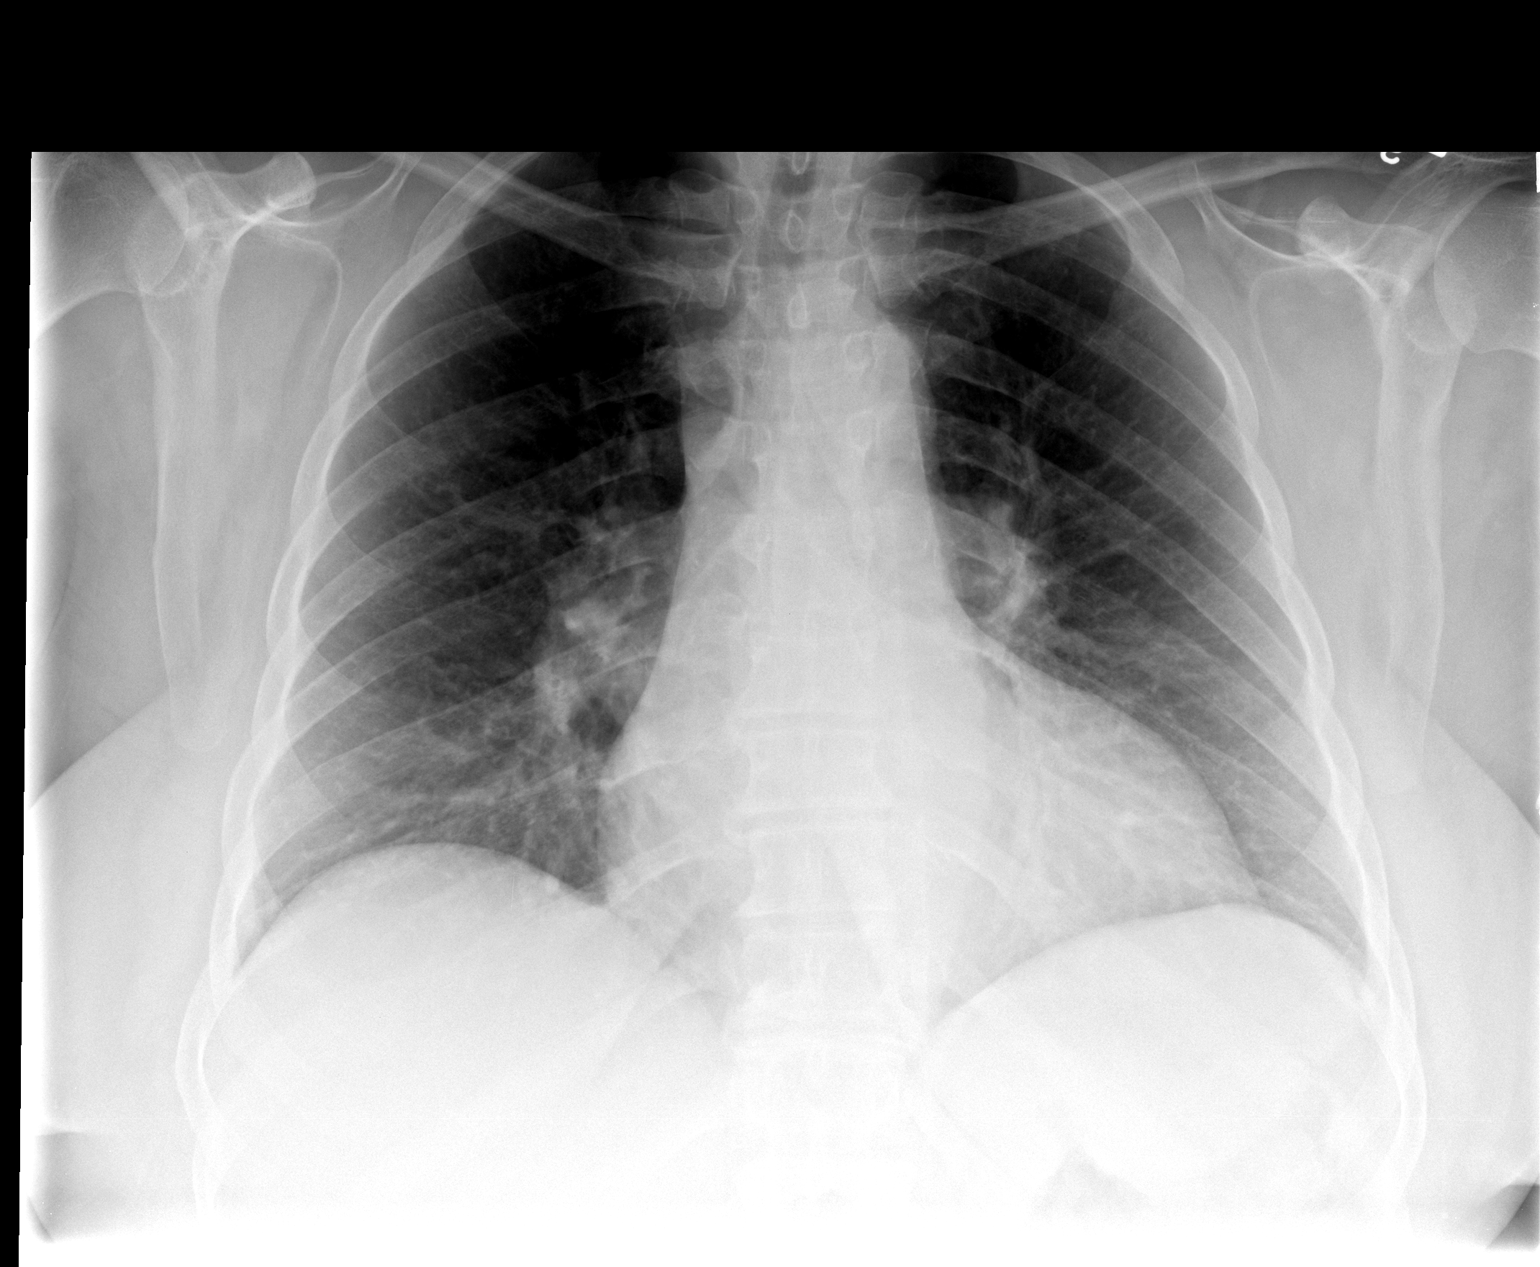

[view not recorded (2 of 2)]
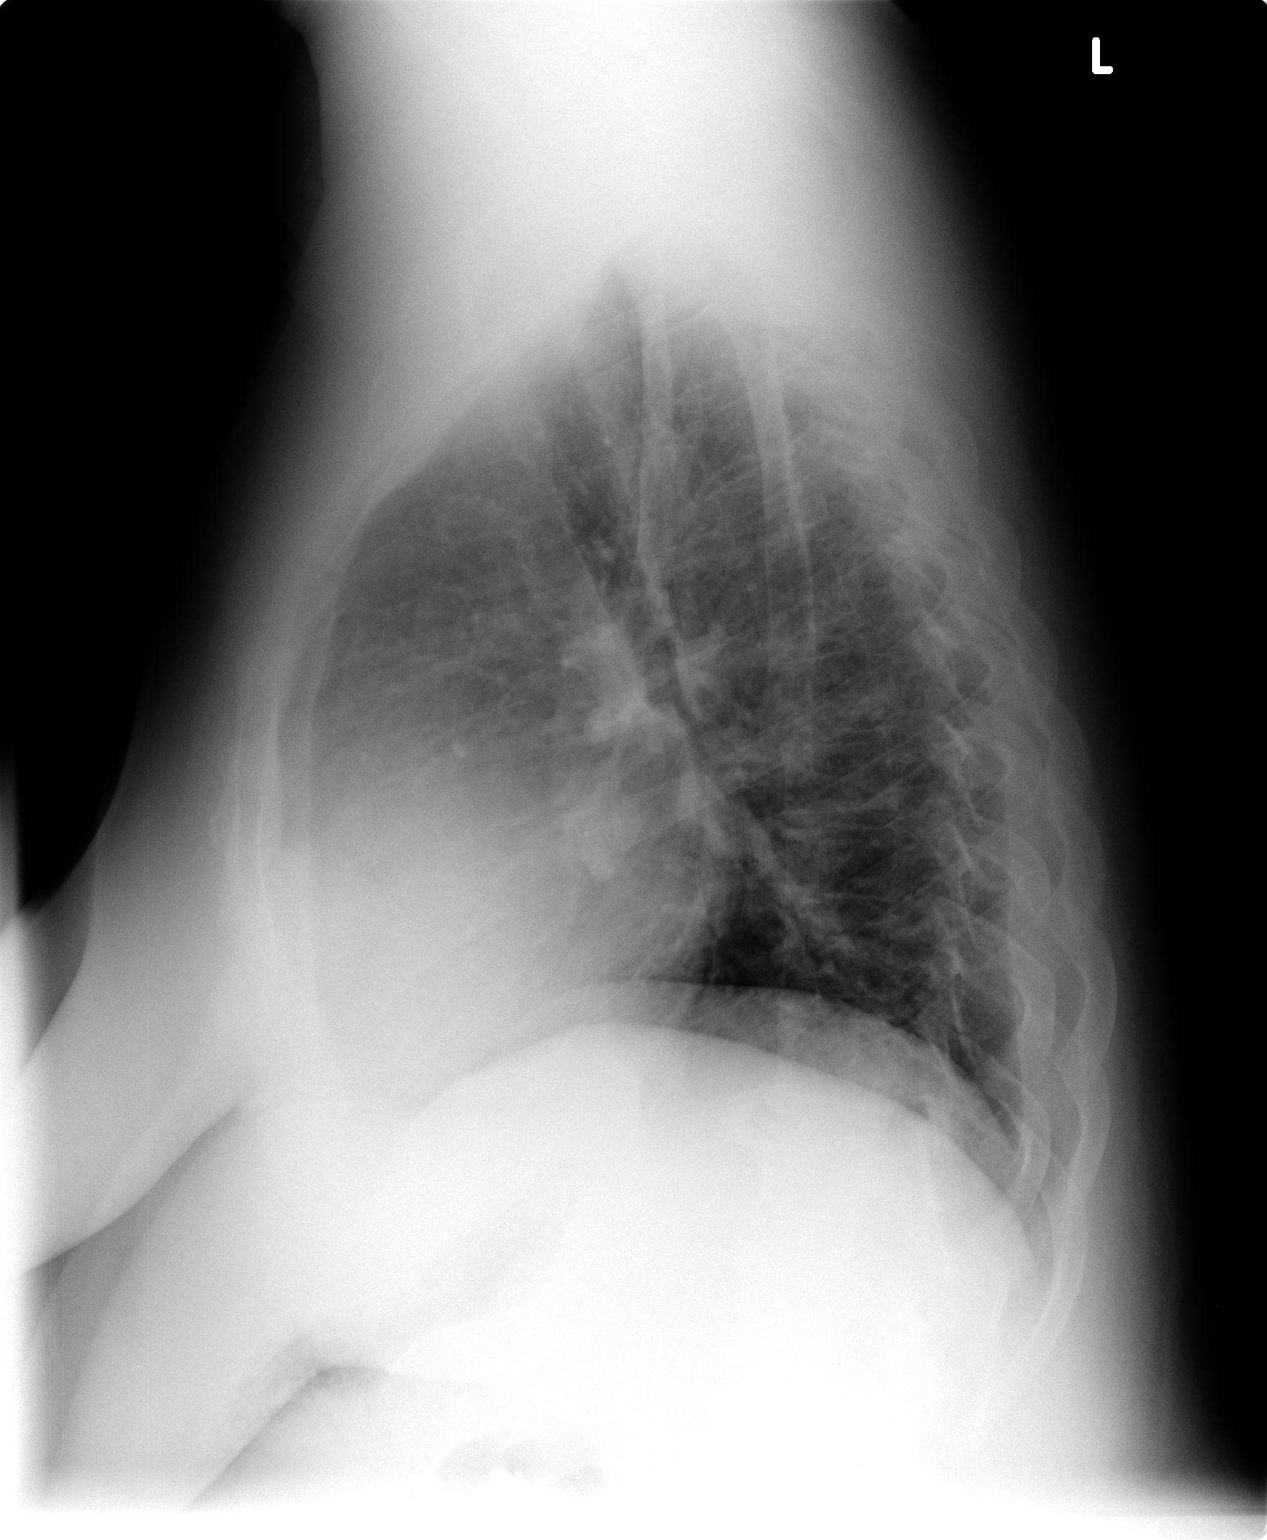

[2 of 2 positions shown; findings below may reference images not displayed]

FINDINGS: Mild cardiomegaly is noted.
Mild peribronchial thickening is unchanged.
There is no evidence of focal airspace disease, pulmonary edema,
pulmonary nodule/mass, pleural effusion, or pneumothorax.
No acute bony abnormalities are identified.
IMPRESSION: No evidence of acute cardiopulmonary disease.

Cardiomegaly and mild chronic peribronchial thickening.

## 2010-08-01 ENCOUNTER — Other Ambulatory Visit
Admission: RE | Admit: 2010-08-01 | Discharge: 2010-08-01 | Payer: Self-pay | Source: Home / Self Care | Admitting: Obstetrics and Gynecology

## 2010-08-25 IMAGING — CR DG SHOULDER 2+V*L*
3 series · 3 of 3 positions shown · non-contrast
Comparison: None.

CLINICAL DATA: Shoulder pain.  No known injury.  History of
arthritis.

LEFT SHOULDER - 2+ VIEW

[view not recorded (1 of 3)]
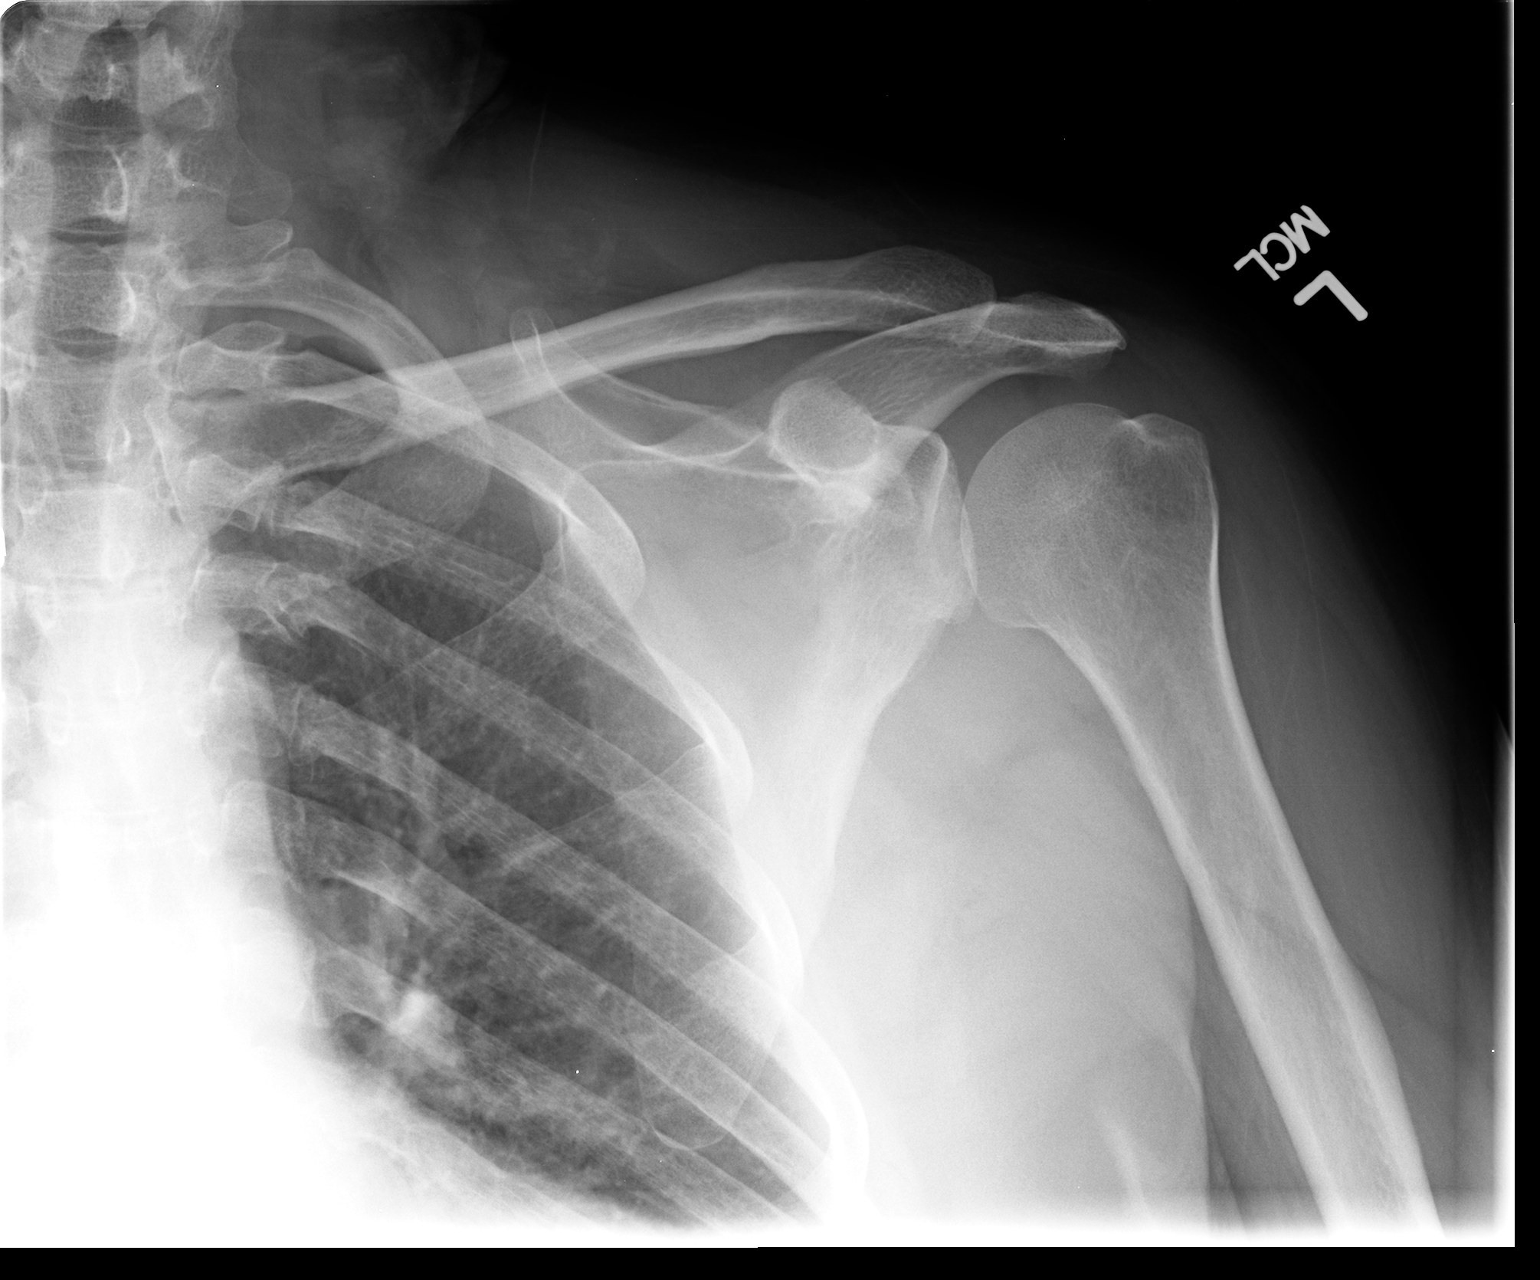

[view not recorded (2 of 3)]
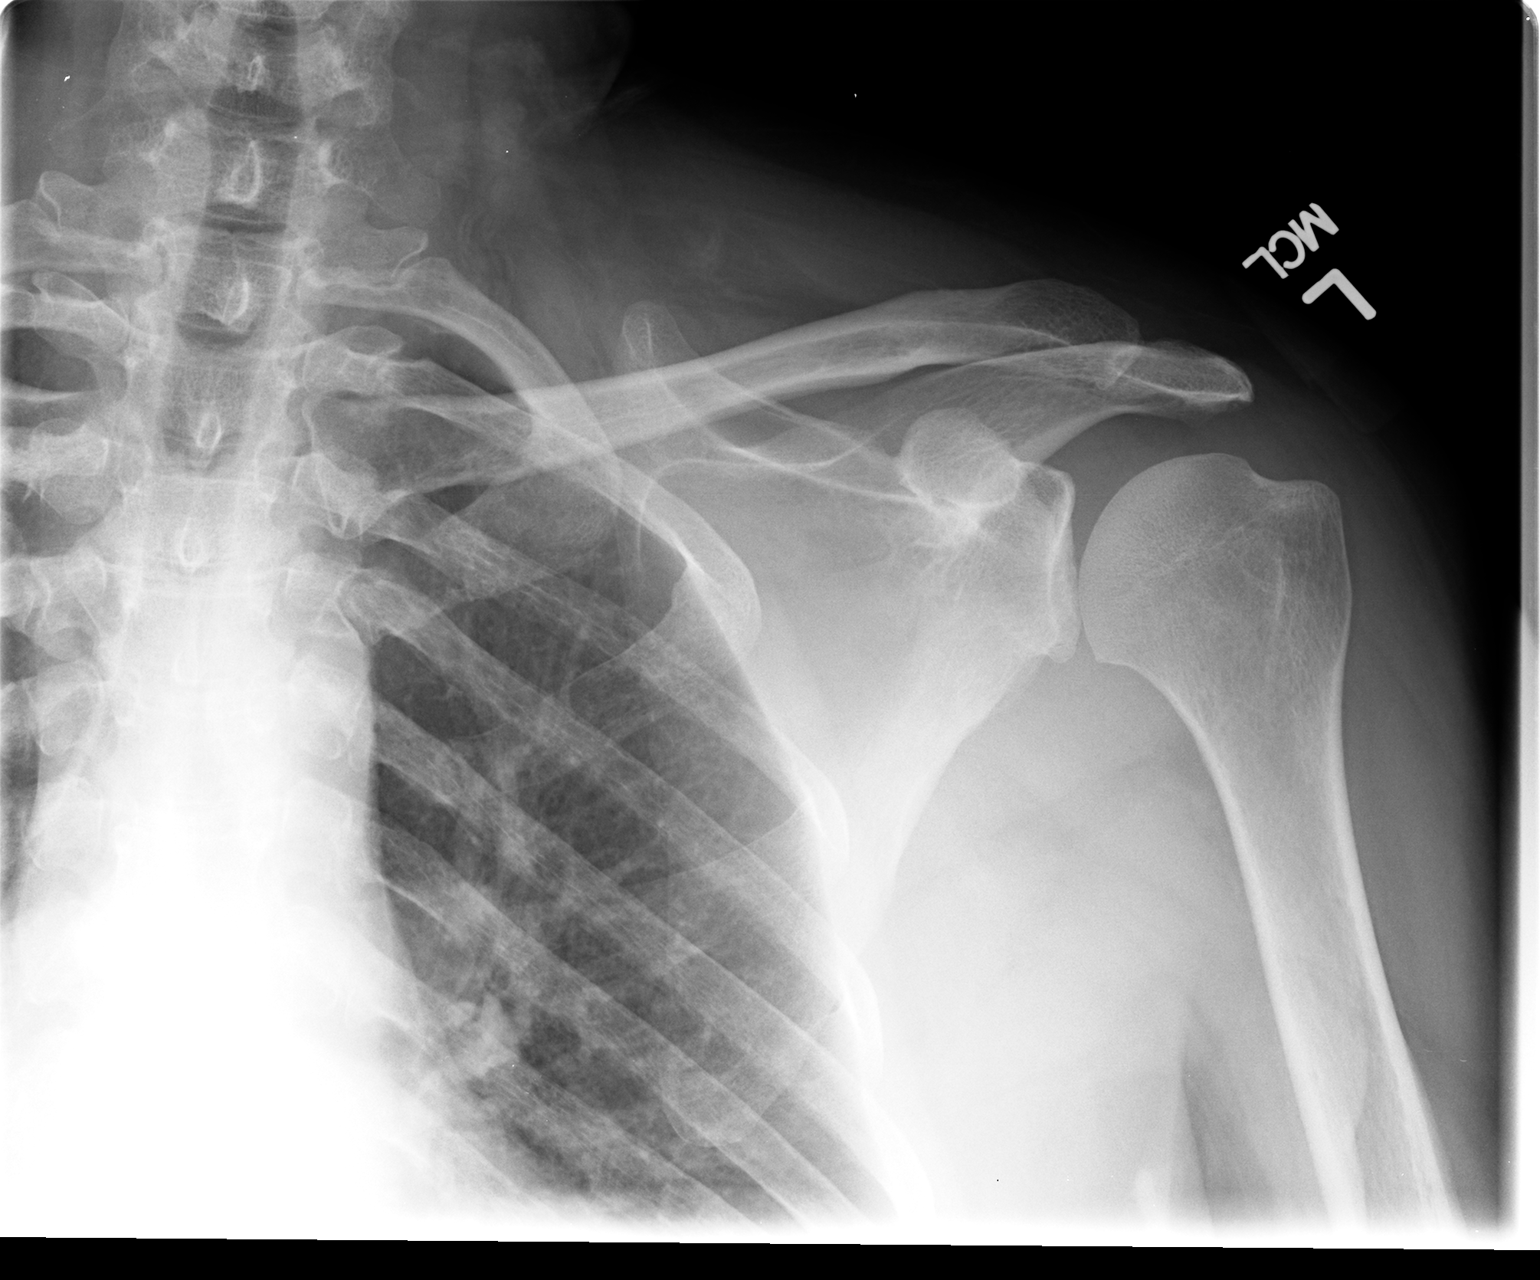

[view not recorded (3 of 3)]
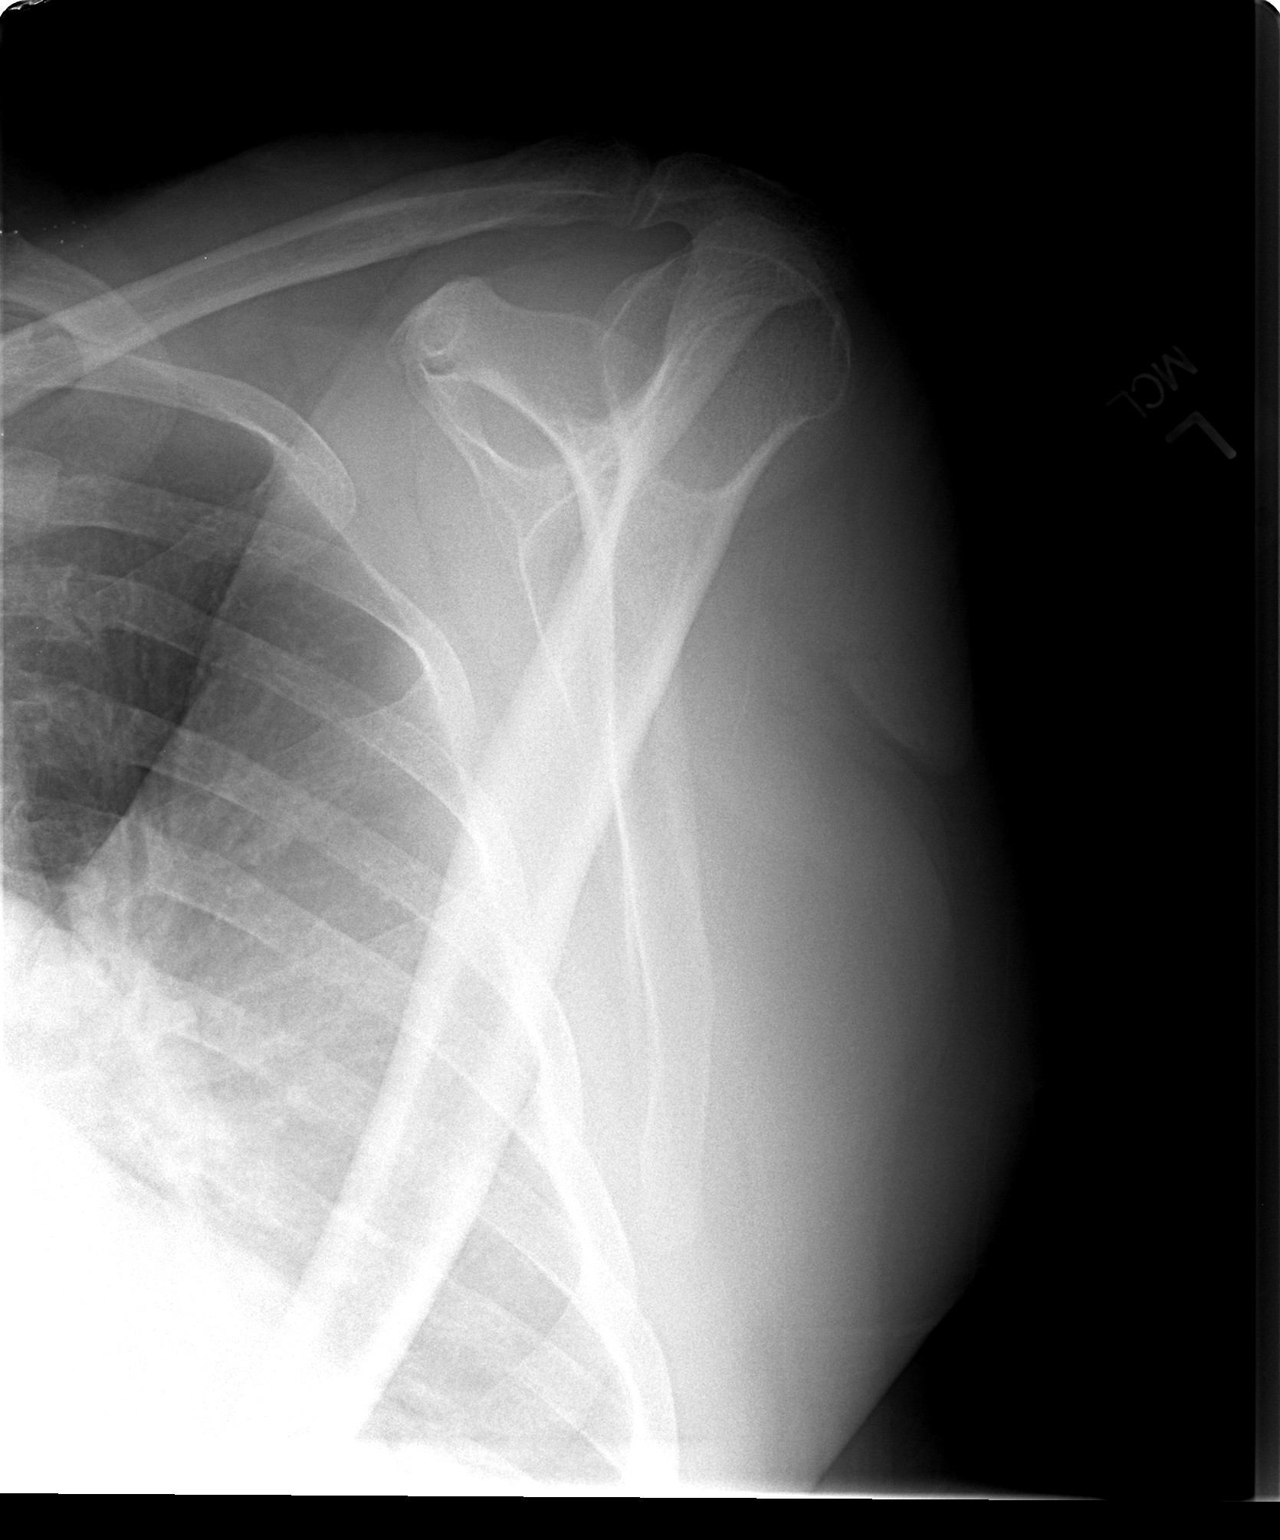

[3 of 3 positions shown; findings below may reference images not displayed]

FINDINGS: Alignment is normal.  Joint spaces are preserved.  No
fracture or dislocation is evident.  No soft tissue lesions are
seen.  No calcific bursitis or calcific tendonitis is evident.  No
cervical rib is evident.
IMPRESSION: No shoulder lesion is evident.

## 2010-09-04 NOTE — Miscellaneous (Signed)
Summary: omeprazole refill  Clinical Lists Changes  Medications: Added new medication of OMEPRAZOLE 20 MG CPDR (OMEPRAZOLE) Take 1 tablet by mouth once a day - Signed Rx of OMEPRAZOLE 20 MG CPDR (OMEPRAZOLE) Take 1 tablet by mouth once a day;  #30 x 2;  Signed;  Entered by: Teressa Lower RN;  Authorized by: Gaylord Shih, MD, [facility];  Method used: [facility]ically to [facility] [phone]*, [address] [place], [place], [name]  [address], Ph: [phone] or [phone], Fax: [phone]    Prescriptions: OMEPRAZOLE 20 MG CPDR (OMEPRAZOLE) Take 1 tablet by mouth once a day  #30 x 2   Entered by:   Teressa Lower RN   Authorized by:   Gaylord Shih, MD, [facility]   Signed by:   Teressa Lower RN on 12/07/2009   Method used:   [facility]ronically to        [facility] [phone]* (retail)       [address]       [place], [name]  [address]       Ph: [phone] or [phone]       Fax: [phone]   RxID:   [id number]

## 2010-09-04 NOTE — Letter (Signed)
Summary: Appointment - Missed  [facility] [place]  [address], [name] [address]   Phone: [phone]  Fax: [phone]     February 20, 2010 MRN: [id number]   [facility] [address] [place], [name]  [address]   Dear [name],  Our records indicate you missed your appointment on      02/20/10                  with Dr.       .         Daleen Squibb                           It is very important that we reach you to reschedule this appointment. We look forward to participating in your health care needs. Please contact us at the number listed above at your earliest convenience to reschedule this appointment.     Sincerely,    Glass blower/designer

## 2010-09-26 ENCOUNTER — Emergency Department (HOSPITAL_COMMUNITY): Payer: Medicaid Other

## 2010-09-26 ENCOUNTER — Emergency Department (HOSPITAL_COMMUNITY)
Admission: EM | Admit: 2010-09-26 | Discharge: 2010-09-26 | Disposition: A | Discharge status: Home / Self Care | Payer: Medicaid Other | Attending: Emergency Medicine | Admitting: Emergency Medicine

## 2010-09-26 DIAGNOSIS — Z79899 Other long term (current) drug therapy: Secondary | ICD-10-CM | POA: Insufficient documentation

## 2010-09-26 DIAGNOSIS — I10 Essential (primary) hypertension: Secondary | ICD-10-CM | POA: Insufficient documentation

## 2010-09-26 DIAGNOSIS — E119 Type 2 diabetes mellitus without complications: Secondary | ICD-10-CM | POA: Insufficient documentation

## 2010-09-26 DIAGNOSIS — J4489 Other specified chronic obstructive pulmonary disease: Secondary | ICD-10-CM | POA: Insufficient documentation

## 2010-09-26 DIAGNOSIS — J449 Chronic obstructive pulmonary disease, unspecified: Secondary | ICD-10-CM | POA: Insufficient documentation

## 2010-09-26 DIAGNOSIS — M25519 Pain in unspecified shoulder: Secondary | ICD-10-CM | POA: Insufficient documentation

## 2010-09-26 DIAGNOSIS — M62838 Other muscle spasm: Secondary | ICD-10-CM | POA: Insufficient documentation

## 2010-10-18 LAB — CBC
HCT: 38.3 % (ref 36.0–46.0)
Hemoglobin: 12.6 g/dL (ref 12.0–15.0)
MCH: 28.5 pg (ref 26.0–34.0)
MCHC: 33 g/dL (ref 30.0–36.0)
MCV: 86.4 fL (ref 78.0–100.0)
Platelets: 241 10*3/uL (ref 150–400)
RBC: 4.44 MIL/uL (ref 3.87–5.11)
RDW: 16.1 % — ABNORMAL HIGH (ref 11.5–15.5)
WBC: 8 10*3/uL (ref 4.0–10.5)

## 2010-10-18 LAB — DIFFERENTIAL
Basophils Absolute: 0 10*3/uL (ref 0.0–0.1)
Basophils Relative: 1 % (ref 0–1)
Eosinophils Absolute: 0.2 10*3/uL (ref 0.0–0.7)
Eosinophils Relative: 2 % (ref 0–5)
Lymphocytes Relative: 24 % (ref 12–46)
Lymphs Abs: 1.9 10*3/uL (ref 0.7–4.0)
Monocytes Absolute: 0.5 10*3/uL (ref 0.1–1.0)
Monocytes Relative: 7 % (ref 3–12)
Neutro Abs: 5.3 10*3/uL (ref 1.7–7.7)
Neutrophils Relative %: 66 % (ref 43–77)

## 2010-10-18 LAB — BASIC METABOLIC PANEL
BUN: 6 mg/dL (ref 6–23)
CO2: 25 mEq/L (ref 19–32)
Calcium: 8.6 mg/dL (ref 8.4–10.5)
Chloride: 106 mEq/L (ref 96–112)
Creatinine, Ser: 0.65 mg/dL (ref 0.4–1.2)
GFR calc Af Amer: >60 mL/min (ref >60)
GFR calc non Af Amer: >60 mL/min (ref >60)
Glucose, Bld: 105 mg/dL — ABNORMAL HIGH (ref 70–99)
Potassium: 3.5 mEq/L (ref 3.5–5.1)
Sodium: 137 mEq/L (ref 135–145)

## 2010-10-20 LAB — POCT I-STAT, CHEM 8
BUN: 11 mg/dL (ref 6–23)
BUN: 11 mg/dL (ref 6–23)
Calcium, Ion: 1.03 mmol/L — ABNORMAL LOW (ref 1.12–1.32)
Calcium, Ion: 1.05 mmol/L — ABNORMAL LOW (ref 1.12–1.32)
Chloride: 107 mEq/L (ref 96–112)
Chloride: 108 mEq/L (ref 96–112)
Creatinine, Ser: 0.8 mg/dL (ref 0.4–1.2)
Creatinine, Ser: 0.8 mg/dL (ref 0.4–1.2)
Glucose, Bld: 100 mg/dL — ABNORMAL HIGH (ref 70–99)
Glucose, Bld: 94 mg/dL (ref 70–99)
HCT: 44 % (ref 36.0–46.0)
HCT: 45 % (ref 36.0–46.0)
Hemoglobin: 15 g/dL (ref 12.0–15.0)
Hemoglobin: 15.3 g/dL — ABNORMAL HIGH (ref 12.0–15.0)
Potassium: 3.7 mEq/L (ref 3.5–5.1)
Potassium: 3.7 mEq/L (ref 3.5–5.1)
Sodium: 137 mEq/L (ref 135–145)
Sodium: 138 mEq/L (ref 135–145)
TCO2: 19 mmol/L (ref 0–100)
TCO2: 20 mmol/L (ref 0–100)

## 2010-10-20 LAB — POCT CARDIAC MARKERS
CKMB, poc: 1.1 ng/mL (ref 1.0–8.0)
CKMB, poc: 1.2 ng/mL (ref 1.0–8.0)
Myoglobin, poc: 41.2 ng/mL (ref 12–200)
Myoglobin, poc: 50.7 ng/mL (ref 12–200)
Troponin i, poc: <0.05 ng/mL (ref 0.00–0.09)
Troponin i, poc: <0.05 ng/mL (ref 0.00–0.09)

## 2010-10-20 LAB — DIFFERENTIAL
Basophils Absolute: 0.1 10*3/uL (ref 0.0–0.1)
Basophils Relative: 1 % (ref 0–1)
Eosinophils Absolute: 0.1 10*3/uL (ref 0.0–0.7)
Eosinophils Relative: 2 % (ref 0–5)
Lymphocytes Relative: 43 % (ref 12–46)
Lymphs Abs: 3.3 10*3/uL (ref 0.7–4.0)
Monocytes Absolute: 0.6 10*3/uL (ref 0.1–1.0)
Monocytes Relative: 7 % (ref 3–12)
Neutro Abs: 3.7 10*3/uL (ref 1.7–7.7)
Neutrophils Relative %: 48 % (ref 43–77)

## 2010-10-20 LAB — CBC
HCT: 40 % (ref 36.0–46.0)
Hemoglobin: 13.2 g/dL (ref 12.0–15.0)
MCH: 28 pg (ref 26.0–34.0)
MCHC: 33 g/dL (ref 30.0–36.0)
MCV: 84.7 fL (ref 78.0–100.0)
Platelets: 285 10*3/uL (ref 150–400)
RBC: 4.72 MIL/uL (ref 3.87–5.11)
RDW: 14.8 % (ref 11.5–15.5)
WBC: 7.7 10*3/uL (ref 4.0–10.5)

## 2010-10-20 LAB — PROTIME-INR
INR: 1.14 (ref 0.00–1.49)
Prothrombin Time: 14.5 seconds (ref 11.6–15.2)

## 2010-10-20 LAB — D-DIMER, QUANTITATIVE (NOT AT ARMC): D-Dimer, Quant: 0.22 ug/mL-FEU (ref 0.00–0.48)

## 2010-10-20 LAB — POCT PREGNANCY, URINE: Preg Test, Ur: NEGATIVE

## 2010-10-27 ENCOUNTER — Emergency Department (HOSPITAL_COMMUNITY): Payer: Medicaid Other

## 2010-10-27 ENCOUNTER — Emergency Department (HOSPITAL_COMMUNITY)
Admission: EM | Admit: 2010-10-27 | Discharge: 2010-10-27 | Disposition: A | Discharge status: Home / Self Care | Payer: Medicaid Other | Attending: Emergency Medicine | Admitting: Emergency Medicine

## 2010-10-27 DIAGNOSIS — R059 Cough, unspecified: Secondary | ICD-10-CM | POA: Insufficient documentation

## 2010-10-27 DIAGNOSIS — R0602 Shortness of breath: Secondary | ICD-10-CM | POA: Insufficient documentation

## 2010-10-27 DIAGNOSIS — J449 Chronic obstructive pulmonary disease, unspecified: Secondary | ICD-10-CM | POA: Insufficient documentation

## 2010-10-27 DIAGNOSIS — J4489 Other specified chronic obstructive pulmonary disease: Secondary | ICD-10-CM | POA: Insufficient documentation

## 2010-10-27 DIAGNOSIS — R05 Cough: Secondary | ICD-10-CM | POA: Insufficient documentation

## 2010-10-27 IMAGING — CR DG CHEST 1V PORT
1 series · 1 of 1 positions shown · non-contrast
Comparison: [DATE], [DATE], [DATE]

CLINICAL DATA: Shortness of breath, cough and congestion

PORTABLE CHEST - 1 VIEW

[view not recorded]
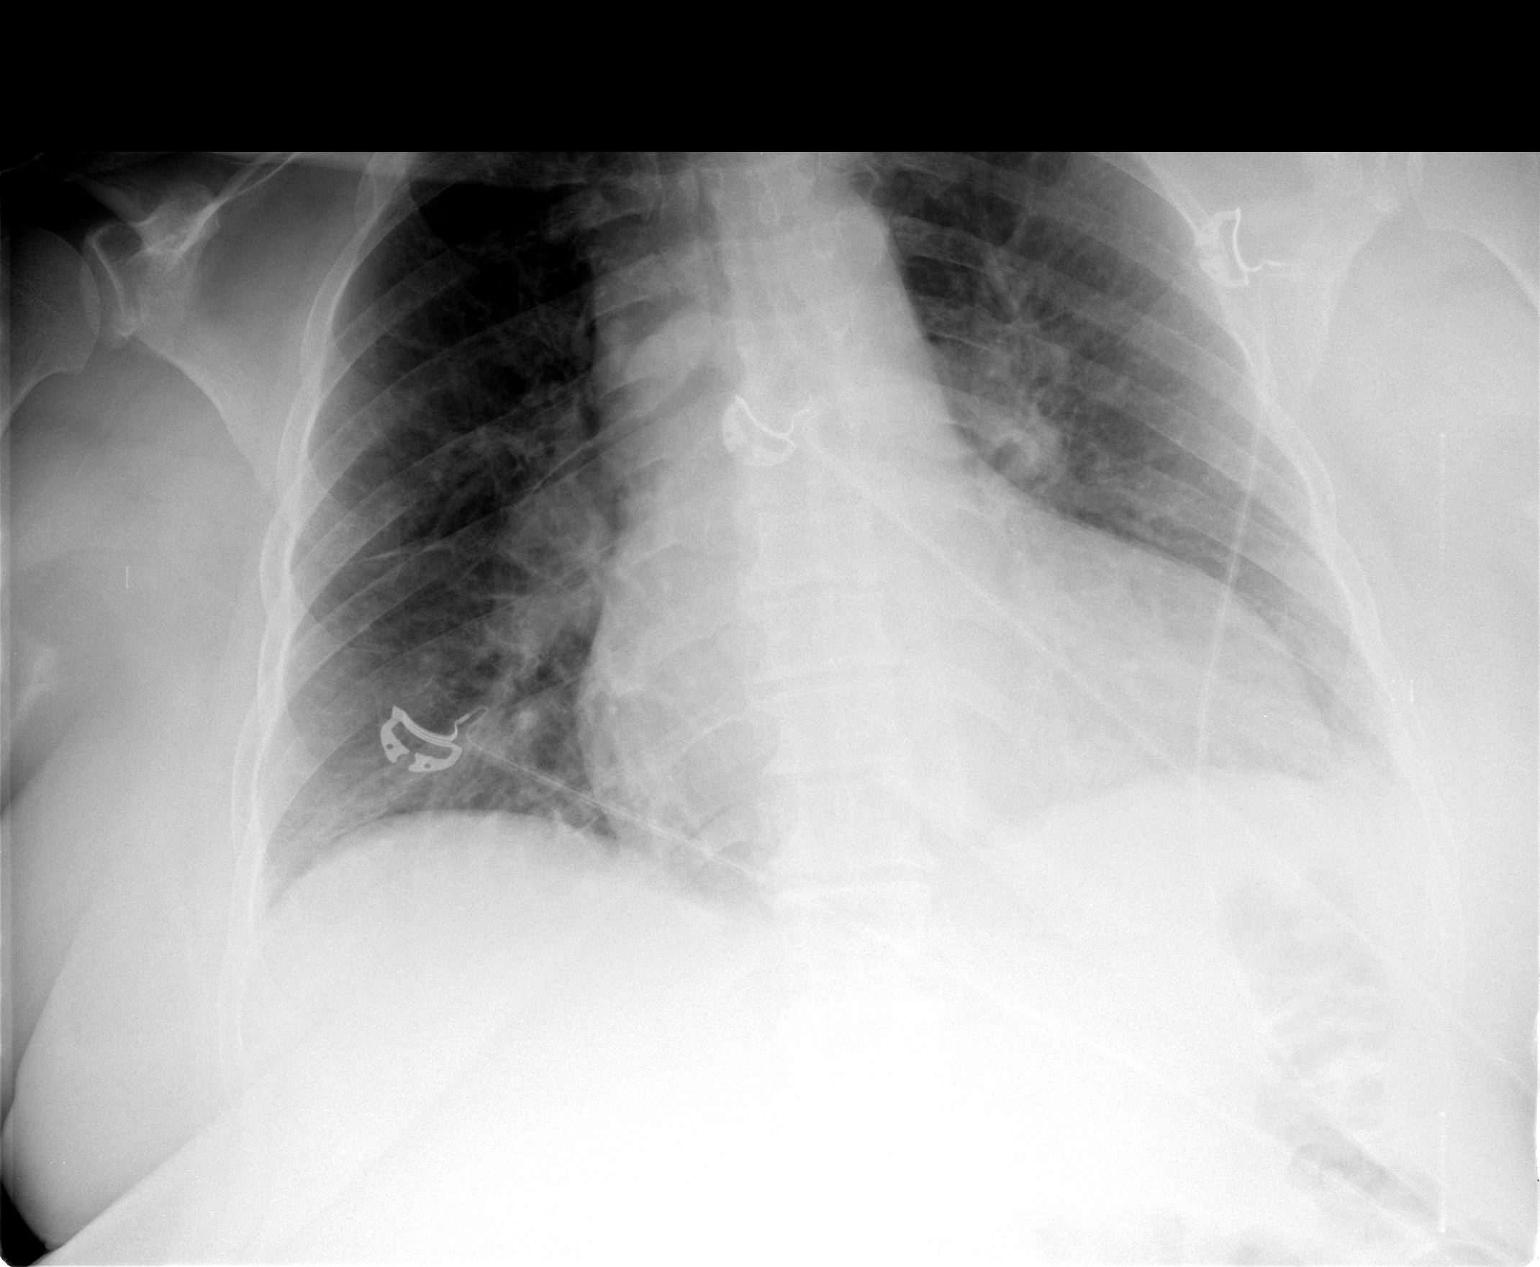

[1 of 1 positions shown; findings below may reference images not displayed]

FINDINGS: Stable cardiomegaly and mediastinal contours.  There is
prominence of the central pulmonary arteries, stable.  Mild
peribronchial thickening/interstitial prominence appears similar to
prior exams.  No airspace disease or visible pleural effusion.  No
pneumothorax.  No acute bony abnormality.
IMPRESSION: Stable cardiomegaly.  Chronic prominent central pulmonary arteries
suggests pulmonary arterial hypertension.

Diffuse mild interstitial prominence.  This could reflect viral
infection, mild interstitial edema, or chronic interstitial
thickening.

## 2010-11-07 ENCOUNTER — Emergency Department (HOSPITAL_COMMUNITY)
Admission: EM | Admit: 2010-11-07 | Discharge: 2010-11-07 | Disposition: A | Discharge status: Home / Self Care | Payer: Medicaid Other | Attending: Emergency Medicine | Admitting: Emergency Medicine

## 2010-11-07 ENCOUNTER — Emergency Department (HOSPITAL_COMMUNITY): Payer: Medicaid Other

## 2010-11-07 DIAGNOSIS — Z79899 Other long term (current) drug therapy: Secondary | ICD-10-CM | POA: Insufficient documentation

## 2010-11-07 DIAGNOSIS — F17200 Nicotine dependence, unspecified, uncomplicated: Secondary | ICD-10-CM | POA: Insufficient documentation

## 2010-11-07 DIAGNOSIS — K219 Gastro-esophageal reflux disease without esophagitis: Secondary | ICD-10-CM | POA: Insufficient documentation

## 2010-11-07 DIAGNOSIS — J4489 Other specified chronic obstructive pulmonary disease: Secondary | ICD-10-CM | POA: Insufficient documentation

## 2010-11-07 DIAGNOSIS — J069 Acute upper respiratory infection, unspecified: Secondary | ICD-10-CM | POA: Insufficient documentation

## 2010-11-07 DIAGNOSIS — R030 Elevated blood-pressure reading, without diagnosis of hypertension: Secondary | ICD-10-CM | POA: Insufficient documentation

## 2010-11-07 DIAGNOSIS — J449 Chronic obstructive pulmonary disease, unspecified: Secondary | ICD-10-CM | POA: Insufficient documentation

## 2010-11-07 DIAGNOSIS — Z9851 Tubal ligation status: Secondary | ICD-10-CM | POA: Insufficient documentation

## 2010-11-07 DIAGNOSIS — E119 Type 2 diabetes mellitus without complications: Secondary | ICD-10-CM | POA: Insufficient documentation

## 2010-11-07 IMAGING — CR DG CHEST 2V
2 series · 2 of 2 positions shown · non-contrast
Comparison: [DATE]

CLINICAL DATA: Cough and congestion for 24 hours.  Smoker.  History
of bronchitis.

CHEST - 2 VIEW

[view not recorded (1 of 2)]
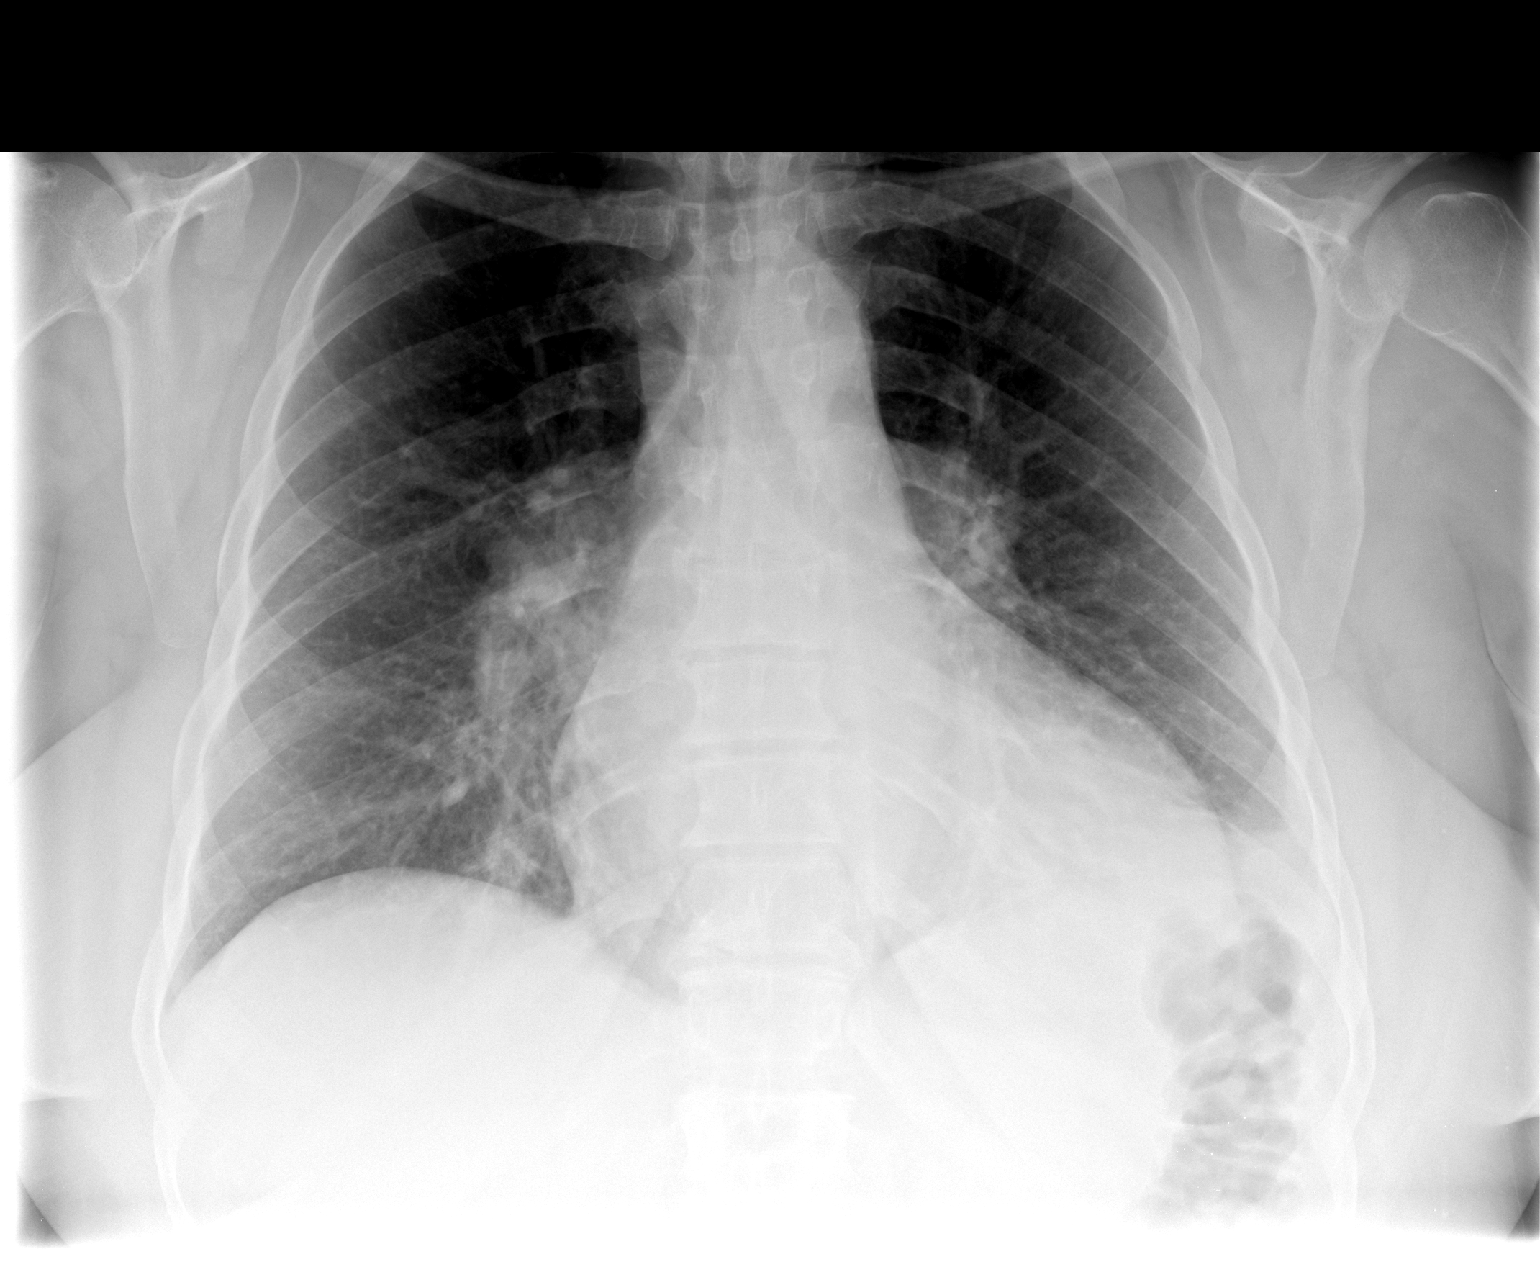

[view not recorded (2 of 2)]
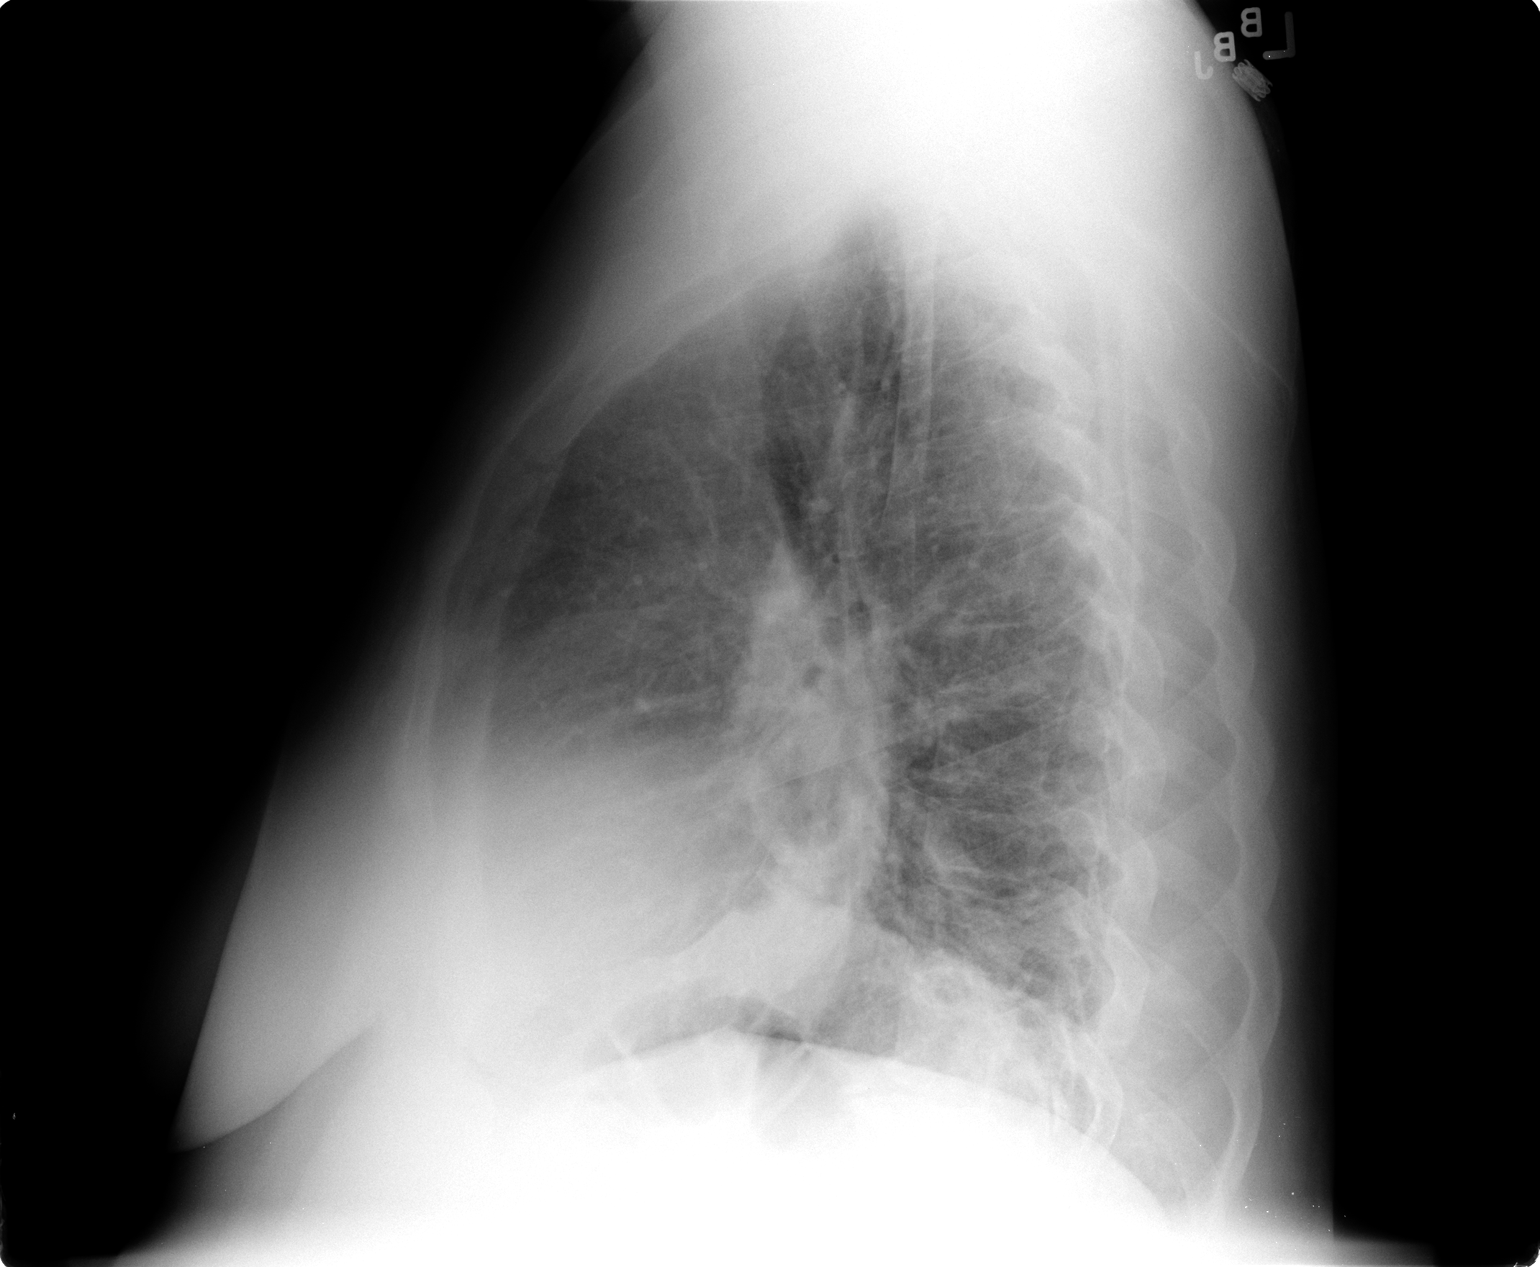

[2 of 2 positions shown; findings below may reference images not displayed]

FINDINGS: Cardiac enlargement similar to before.  Normal pulmonary
vascularity.  No blunting of costophrenic angles.  No focal
airspace disease or consolidation.
IMPRESSION: No evidence of active pulmonary disease.  Mild cardiac enlargement.

## 2010-11-15 LAB — GLUCOSE, CAPILLARY
Glucose-Capillary: 129 mg/dL — ABNORMAL HIGH (ref 70–99)
Glucose-Capillary: 88 mg/dL (ref 70–99)

## 2010-11-15 LAB — POCT CARDIAC MARKERS
CKMB, poc: <1 ng/mL — ABNORMAL LOW (ref 1.0–8.0)
Myoglobin, poc: 34 ng/mL (ref 12–200)
Troponin i, poc: <0.05 ng/mL (ref 0.00–0.09)

## 2010-11-19 ENCOUNTER — Emergency Department (HOSPITAL_COMMUNITY): Payer: Medicaid Other

## 2010-11-19 ENCOUNTER — Emergency Department (HOSPITAL_COMMUNITY)
Admission: EM | Admit: 2010-11-19 | Discharge: 2010-11-19 | Disposition: A | Discharge status: Home / Self Care | Payer: Medicaid Other | Attending: Emergency Medicine | Admitting: Emergency Medicine

## 2010-11-19 DIAGNOSIS — J40 Bronchitis, not specified as acute or chronic: Secondary | ICD-10-CM | POA: Insufficient documentation

## 2010-11-19 DIAGNOSIS — I10 Essential (primary) hypertension: Secondary | ICD-10-CM | POA: Insufficient documentation

## 2010-11-19 DIAGNOSIS — R040 Epistaxis: Secondary | ICD-10-CM | POA: Insufficient documentation

## 2010-11-19 DIAGNOSIS — J4489 Other specified chronic obstructive pulmonary disease: Secondary | ICD-10-CM | POA: Insufficient documentation

## 2010-11-19 DIAGNOSIS — E119 Type 2 diabetes mellitus without complications: Secondary | ICD-10-CM | POA: Insufficient documentation

## 2010-11-19 DIAGNOSIS — J449 Chronic obstructive pulmonary disease, unspecified: Secondary | ICD-10-CM | POA: Insufficient documentation

## 2010-11-19 DIAGNOSIS — F17200 Nicotine dependence, unspecified, uncomplicated: Secondary | ICD-10-CM | POA: Insufficient documentation

## 2010-11-19 DIAGNOSIS — K219 Gastro-esophageal reflux disease without esophagitis: Secondary | ICD-10-CM | POA: Insufficient documentation

## 2010-11-19 DIAGNOSIS — R51 Headache: Secondary | ICD-10-CM | POA: Insufficient documentation

## 2010-11-19 LAB — CBC
HCT: 31.7 % — ABNORMAL LOW (ref 36.0–46.0)
HCT: 38.4 % (ref 36.0–46.0)
Hemoglobin: 9.4 g/dL — ABNORMAL LOW (ref 12.0–15.0)
Hemoglobin: 12.8 g/dL (ref 12.0–15.0)
MCH: 28.7 pg (ref 26.0–34.0)
MCHC: 29.6 g/dL — ABNORMAL LOW (ref 30.0–36.0)
MCHC: 33.3 g/dL (ref 30.0–36.0)
MCV: 65.5 fL — ABNORMAL LOW (ref 78.0–100.0)
MCV: 86.1 fL (ref 78.0–100.0)
Platelets: 384 10*3/uL (ref 150–400)
Platelets: 290 10*3/uL (ref 150–400)
RBC: 4.84 MIL/uL (ref 3.87–5.11)
RBC: 4.46 MIL/uL (ref 3.87–5.11)
RDW: 19 % — ABNORMAL HIGH (ref 11.5–15.5)
RDW: 14.6 % (ref 11.5–15.5)
WBC: 13.1 10*3/uL — ABNORMAL HIGH (ref 4.0–10.5)
WBC: 7.9 10*3/uL (ref 4.0–10.5)

## 2010-11-19 LAB — BASIC METABOLIC PANEL
BUN: 8 mg/dL (ref 6–23)
CO2: 27 mEq/L (ref 19–32)
Calcium: 8.9 mg/dL (ref 8.4–10.5)
Chloride: 104 mEq/L (ref 96–112)
Creatinine, Ser: 0.73 mg/dL (ref 0.4–1.2)
GFR calc Af Amer: >60 mL/min (ref >60)
GFR calc non Af Amer: >60 mL/min (ref >60)
Glucose, Bld: 101 mg/dL — ABNORMAL HIGH (ref 70–99)
Potassium: 3.7 mEq/L (ref 3.5–5.1)
Sodium: 138 mEq/L (ref 135–145)

## 2010-11-19 LAB — COMPREHENSIVE METABOLIC PANEL
ALT: 16 U/L (ref 0–35)
AST: 20 U/L (ref 0–37)
Albumin: 3.5 g/dL (ref 3.5–5.2)
Alkaline Phosphatase: 94 U/L (ref 39–117)
BUN: 12 mg/dL (ref 6–23)
CO2: 25 mEq/L (ref 19–32)
Calcium: 9.2 mg/dL (ref 8.4–10.5)
Chloride: 105 mEq/L (ref 96–112)
Creatinine, Ser: 0.99 mg/dL (ref 0.4–1.2)
GFR calc Af Amer: >60 mL/min (ref >60)
GFR calc non Af Amer: >60 mL/min (ref >60)
Glucose, Bld: 161 mg/dL — ABNORMAL HIGH (ref 70–99)
Potassium: 4 mEq/L (ref 3.5–5.1)
Sodium: 138 mEq/L (ref 135–145)
Total Bilirubin: 0.2 mg/dL — ABNORMAL LOW (ref 0.3–1.2)
Total Protein: 6.8 g/dL (ref 6.0–8.3)

## 2010-11-19 LAB — DIFFERENTIAL
Basophils Absolute: 0 10*3/uL (ref 0.0–0.1)
Basophils Absolute: 0 10*3/uL (ref 0.0–0.1)
Basophils Relative: 0 % (ref 0–1)
Basophils Relative: 0 % (ref 0–1)
Eosinophils Absolute: 0.1 10*3/uL (ref 0.0–0.7)
Eosinophils Absolute: 0 10*3/uL (ref 0.0–0.7)
Eosinophils Relative: 1 % (ref 0–5)
Eosinophils Relative: 0 % (ref 0–5)
Lymphocytes Relative: 17 % (ref 12–46)
Lymphocytes Relative: 9 % — ABNORMAL LOW (ref 12–46)
Lymphs Abs: 2.2 10*3/uL (ref 0.7–4.0)
Lymphs Abs: 0.7 10*3/uL (ref 0.7–4.0)
Monocytes Absolute: 0.9 10*3/uL (ref 0.1–1.0)
Monocytes Absolute: 0.3 10*3/uL (ref 0.1–1.0)
Monocytes Relative: 7 % (ref 3–12)
Monocytes Relative: 3 % (ref 3–12)
Neutro Abs: 9.8 10*3/uL — ABNORMAL HIGH (ref 1.7–7.7)
Neutro Abs: 6.9 10*3/uL (ref 1.7–7.7)
Neutrophils Relative %: 75 % (ref 43–77)
Neutrophils Relative %: 87 % — ABNORMAL HIGH (ref 43–77)

## 2010-11-19 IMAGING — CT CT HEAD W/O CM
1 series · 16 of 30 positions shown, 20 images · non-contrast
Comparison: None

CLINICAL DATA: Headache, blurred vision, dizziness, epistaxis,
history anemia, asthma, COPD, hypertension, diabetes

CT HEAD WITHOUT CONTRAST
TECHNIQUE: Contiguous axial images were obtained from the base of
the skull through the vertex without contrast.

[Series 2: headseq 4.8 h37s · axial · 0.43mm/px · z∈[+1069,+1204]mm · 16 of 30 slices shown, 20 images]
[im 2/30  brain]
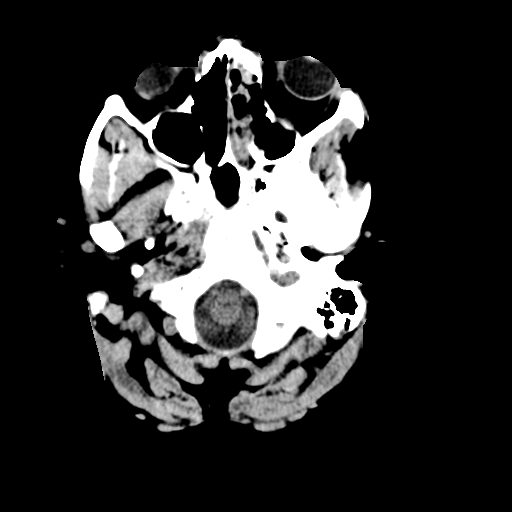
[im 2/30  bone]
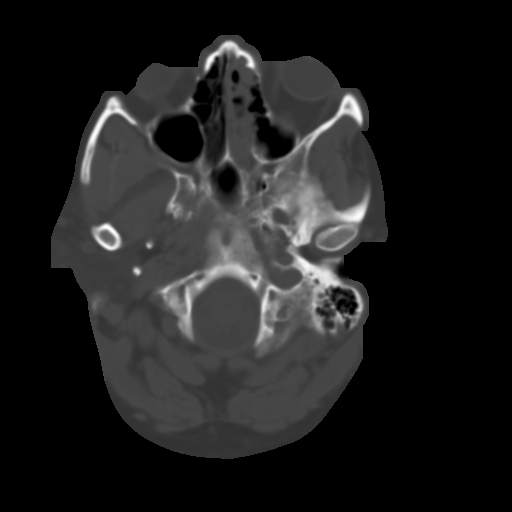
[im 4/30  brain]
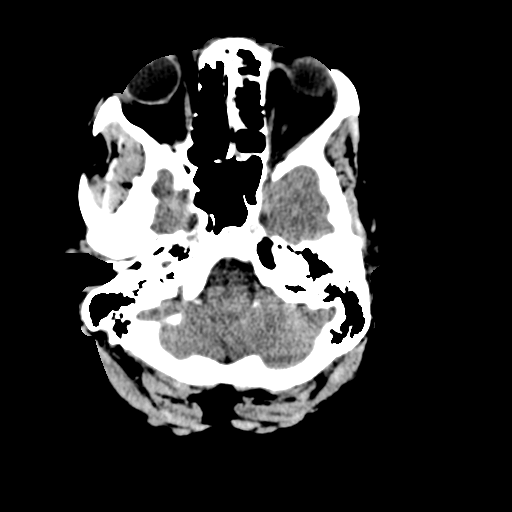
[im 6/30  brain]
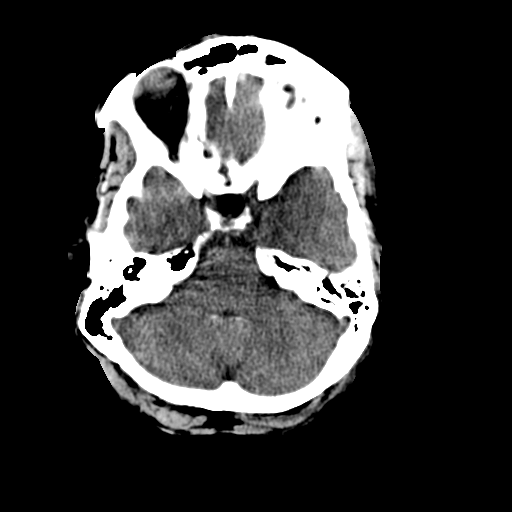
[im 8/30  brain]
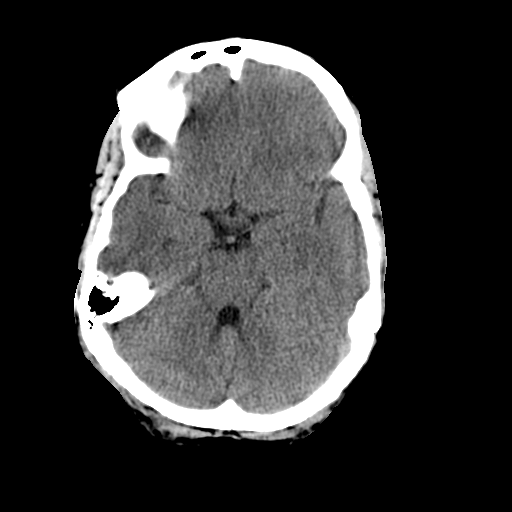
[im 9/30  brain]
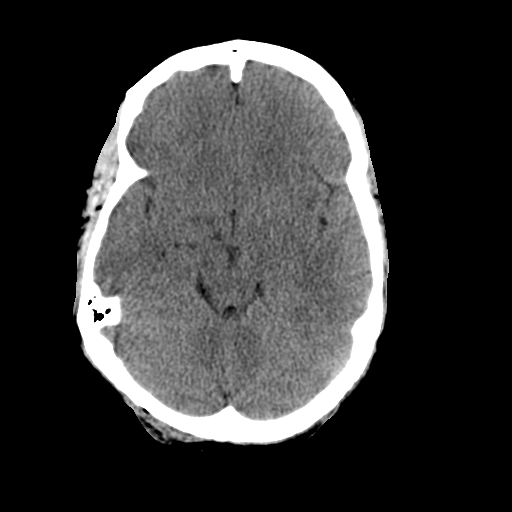
[im 9/30  bone]
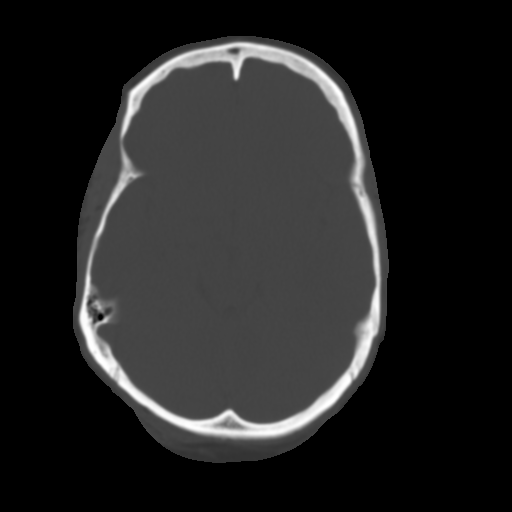
[im 11/30  brain]
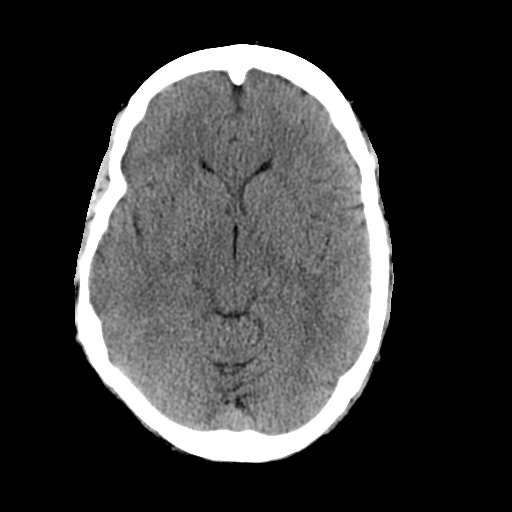
[im 13/30  brain]
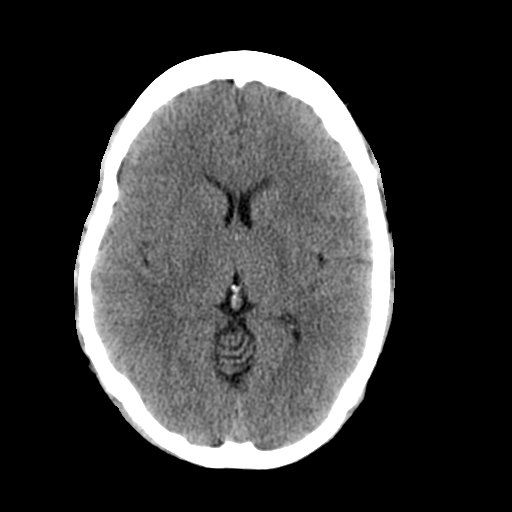
[im 15/30  brain]
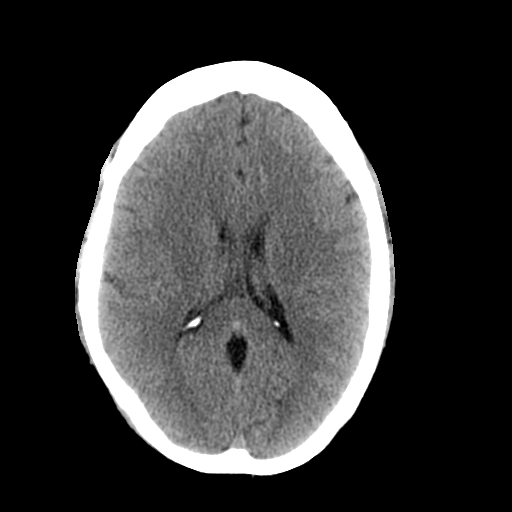
[im 16/30  brain]
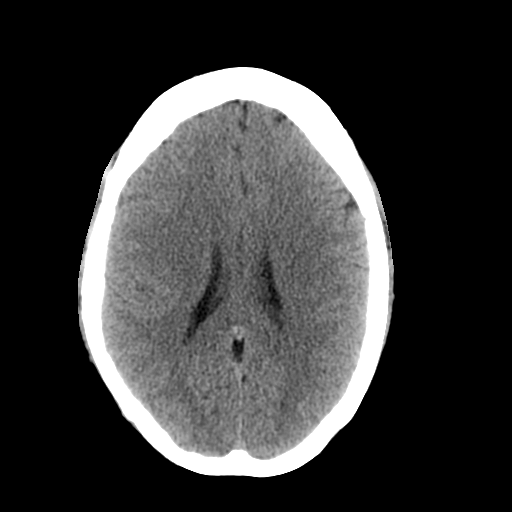
[im 16/30  bone]
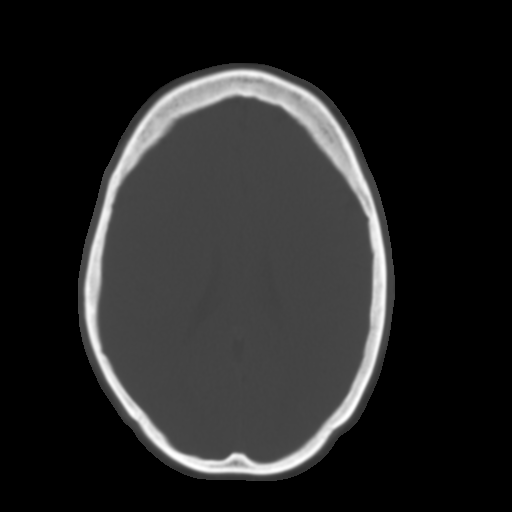
[im 18/30  brain]
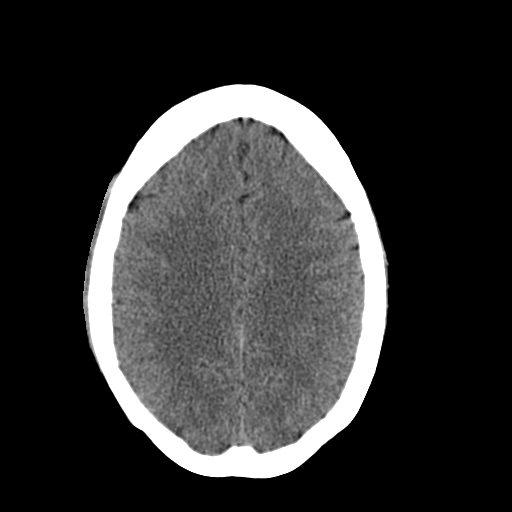
[im 20/30  brain]
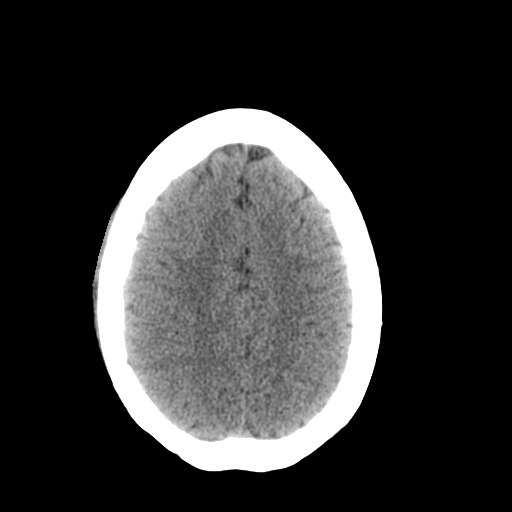
[im 22/30  brain]
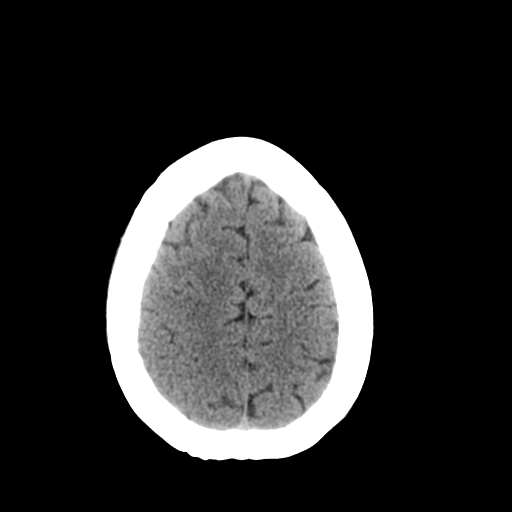
[im 23/30  brain]
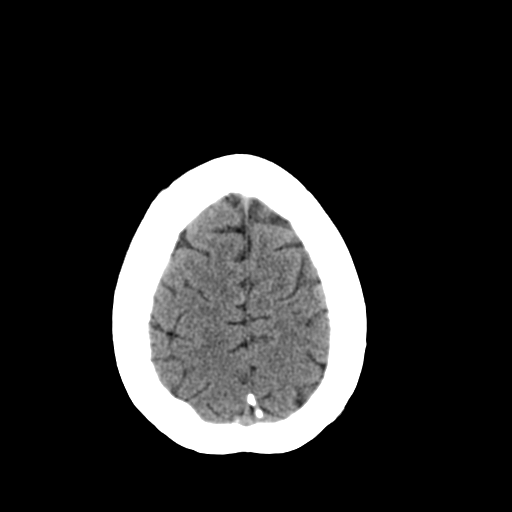
[im 23/30  bone]
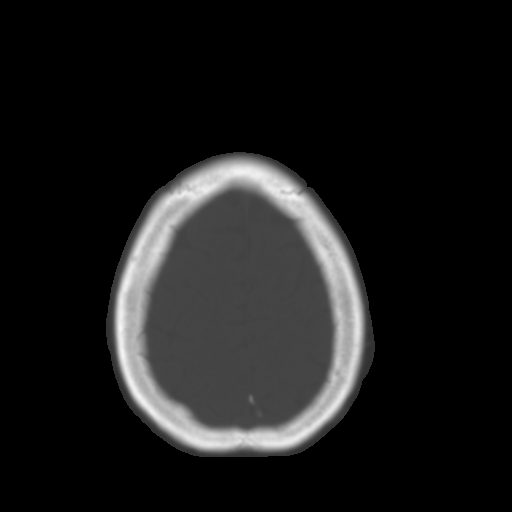
[im 25/30  brain]
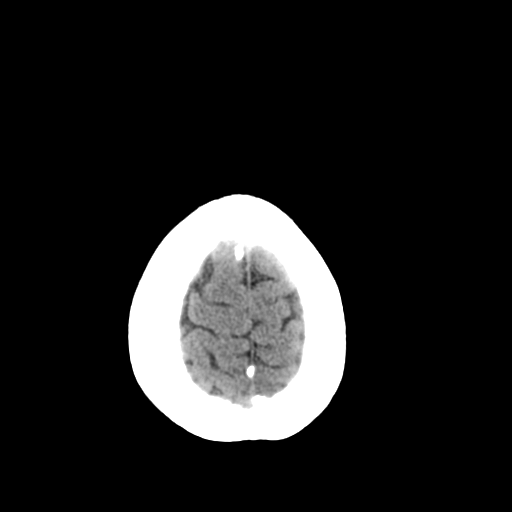
[im 27/30  brain]
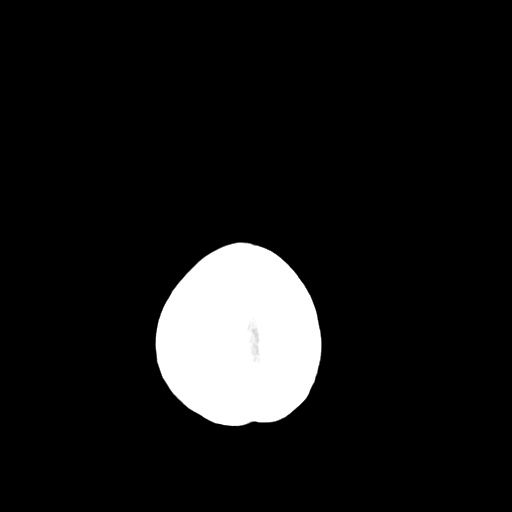
[im 29/30  brain]
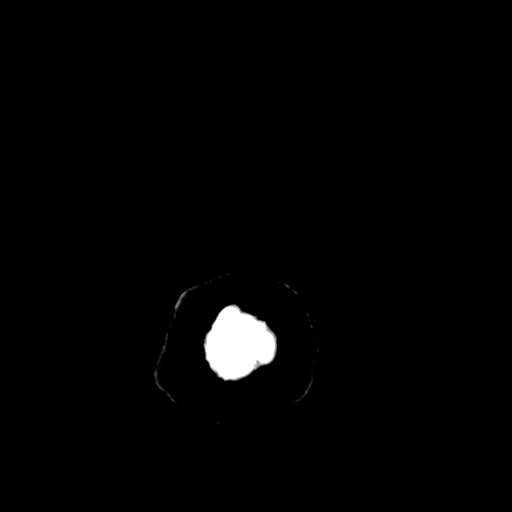

[16 of 30 positions shown; findings below may reference images not displayed]

FINDINGS: Question prominent adenoids versus soft tissue at nasopharynx on
first image.
Normal ventricular morphology.
No midline shift or mass effect.
Normal appearance of brain parenchyma.
No intracranial hemorrhage, mass lesion or evidence of acute
infarction.
Posterior fossa unremarkable.
Mucosal thickening in scattered left ethmoid air cells and left
nasal passage.
Remaining paranasal sinuses and mastoid air cells clear.
No acute osseous findings.
IMPRESSION: No acute intracranial abnormalities.
Mucosal thickening left ethmoid air cells and left nasal passages.
Question mildly prominent adenoids versus prominent soft tissue at
nasopharynx.

## 2010-11-20 LAB — CBC
HCT: 35.1 % — ABNORMAL LOW (ref 36.0–46.0)
Hemoglobin: 11.1 g/dL — ABNORMAL LOW (ref 12.0–15.0)
MCHC: 31.6 g/dL (ref 30.0–36.0)
MCV: 68 fL — ABNORMAL LOW (ref 78.0–100.0)
Platelets: 348 10*3/uL (ref 150–400)
RBC: 5.16 MIL/uL — ABNORMAL HIGH (ref 3.87–5.11)
RDW: 21.7 % — ABNORMAL HIGH (ref 11.5–15.5)
WBC: 7 10*3/uL (ref 4.0–10.5)

## 2010-11-20 LAB — DIFFERENTIAL
Basophils Absolute: 0 10*3/uL (ref 0.0–0.1)
Basophils Relative: 0 % (ref 0–1)
Eosinophils Absolute: 0.2 10*3/uL (ref 0.0–0.7)
Eosinophils Relative: 3 % (ref 0–5)
Lymphocytes Relative: 35 % (ref 12–46)
Lymphs Abs: 2.4 10*3/uL (ref 0.7–4.0)
Monocytes Absolute: 0.6 10*3/uL (ref 0.1–1.0)
Monocytes Relative: 9 % (ref 3–12)
Neutro Abs: 3.7 10*3/uL (ref 1.7–7.7)
Neutrophils Relative %: 53 % (ref 43–77)

## 2010-11-20 LAB — POCT CARDIAC MARKERS
CKMB, poc: <1 ng/mL — ABNORMAL LOW (ref 1.0–8.0)
Myoglobin, poc: 32.7 ng/mL (ref 12–200)
Troponin i, poc: <0.05 ng/mL (ref 0.00–0.09)

## 2010-11-20 LAB — BASIC METABOLIC PANEL
BUN: 8 mg/dL (ref 6–23)
CO2: 27 mEq/L (ref 19–32)
Calcium: 9.2 mg/dL (ref 8.4–10.5)
Chloride: 104 mEq/L (ref 96–112)
Creatinine, Ser: 0.79 mg/dL (ref 0.4–1.2)
GFR calc Af Amer: >60 mL/min (ref >60)
GFR calc non Af Amer: >60 mL/min (ref >60)
Glucose, Bld: 119 mg/dL — ABNORMAL HIGH (ref 70–99)
Potassium: 4.3 mEq/L (ref 3.5–5.1)
Sodium: 138 mEq/L (ref 135–145)

## 2010-12-18 NOTE — Assessment & Plan Note (Signed)
[facility]                       [place] CARDIOLOGY OFFICE NOTE   Robin, [name]                       MRN:          [id number]  DATE:09/30/2008                            DOB:          03-13-1966    CHIEF COMPLAINT:  Chest pain.   HISTORY OF PRESENT ILLNESS:  Robin [name] is a 45 year old married black  female who woke up Wednesday morning with some chest discomfort.  It was  described as sharp pain.  It was above her left breast.  It did not  radiate.   She went to the emergency department and was evaluated.  Because of risk  factor, she was set up for a stress Myoview.   Stress Myoview showed very poor exercise tolerance necessitating a  change over to [place].  She has some breast attenuation, but has some  anterior apical hypokinesia and also a question of some peri-infarct  ischemia with a fixed defect with some mild reversibility in the  anterolateral wall.  Please refer to the nuclear report by Dr. Simona Huh.   Ejection fraction was 53%.   She has had some dull, aching, heaviness in her chest this morning.  She  denies any nausea, vomiting, or diaphoresis.  She is currently having a  little bit of aching there now.  EKG in the setting is normal today.  She does have some baseline J-point elevation or early repolarization.   PAST MEDICAL HISTORY:   ALLERGIES:  No known drug allergies.   CURRENT MEDICATIONS:  1. Enalapril 5 mg per day.  2. Glimepiride 2 mg per day.  3. Metformin 1000 mg p.o. b.i.d.  4. HCTZ 25 mg per day.  5. Prevacid 20 mg per day.  6. She takes ibuprofen and tramadol p.r.n.   Her risk factors include diabetes, hypertension, and tobacco use.  She  started smoking when she was 16, smokes about half pack of cigarettes a  day.  She is also grossly overweight.   She has had no surgeries.   SOCIAL HISTORY:  She is currently unemployed.  She is married, has 3  children.   FAMILY HISTORY:  Remarkable  for some blood clots in her father, but no  premature coronary artery disease.   REVIEW OF SYSTEMS:  She wears reading glasses.  She has her natural  teeth.  she has a history of gastroesophageal reflux.  All of her other  review of systems are negative.  Please refer to higher diagnostic  evaluation form.   PHYSICAL EXAMINATION:  GENERAL:  She is a pleasant, extremely overweight  black female in no acute distress.  VITAL SIGNS:  Blood pressure 168 in the right arm, pulse 79 and regular.  HEENT:  Normocephalic and atraumatic.  PERRLA.  Extraocular movements  are intact.  Sclerae are clear.  Facial symmetry is normal.  Dentition  satisfactory.  Oral mucosa normal.  NECK:  Supple.  Carotid upstrokes were equal bilaterally without bruits,  no JVD.  Thyroid is not enlarged.  Trachea is midline.  LUNGS:  Clear to auscultation and percussion.  HEART:  Poorly appreciated PMI.  She  has large breasts.  Normal S1 and  S2.  No rub or gallop.  She has tender over the upper the left anterior  chest wall to palpation.  There is no crepitance.  ABDOMEN:  She is obese.  She is having good bowel sounds.  She has  abdominal striae.  EXTREMITIES:  No cyanosis, clubbing, or edema.  Pulses are brisk.  NEUROLOGIC:  Grossly intact.  SKIN:  Unremarkable, otherwise.   ASSESSMENT AND PLAN:  1. Probable noncardiac chest pain, probably musculoskeletal.  2. Abnormal stress Myoview with a question of anterolateral ischemia      and hypokinesia.  3. Hypertension.  4. Tobacco use.  5. Type 2 diabetes.  6. Obesity.  7. Sedentary lifestyle.   I had a long talk with Robin [name] today.  I have asked her to start  enteric-coated aspirin 325 mg per day.  I have arranged her for an  outpatient cardiac catheterization next week.  We will hold her  metformin 24 hours before the procedure.  Hopefully, this is a false  positive scan.   We also spent a lot of time talking about smoking cessation, regular  walking,  and weight reduction.  We will also reinforce this when she  returns postcatheterization visit.     Thomas C. Daleen Squibb, MD, [facility]  Electronically Signed    TCW/MedQ  DD: 09/30/2008  DT: 10/01/2008  Job #: [id number]   cc:   Francoise Schaumann. Halm, DO, FAAP

## 2010-12-18 NOTE — Op Note (Signed)
[name], Robin [name]                ACCOUNT NO.:  [id number]   MEDICAL RECORD NO.:  [id number]          PATIENT TYPE:  AMB   LOCATION:  DAY                           FACILITY:  [facility]   PHYSICIAN:  Tilda Burrow, M.D. DATE OF BIRTH:  11/06/1965   DATE OF PROCEDURE:  DATE OF DISCHARGE:                               OPERATIVE REPORT   PREOPERATIVE DIAGNOSES:  1. Menorrhagia.  2. Anemia.  3. Uterine fibroids.   POSTOPERATIVE DIAGNOSES:  1. Menorrhagia.  2. Anemia.  3. Uterine fibroids.   PROCEDURE:  Hysteroscopy and endometrial ablation (no dilation and  curettage required).   SURGEON:  Tilda Burrow, MD   ASSISTANT:  None.   ANESTHESIA:  General.   COMPLICATIONS:  None.   FINDINGS:  Smooth uniform endometrial cavity sounded to 11 cm.  No  endometrial tissue, atrophic tissue secondary to Provera use.   INDICATIONS:  A 45 year old female with known uterine fibroids  attempting to achieve control of unacceptably heavy menses, with  documented anemia with hemoglobin to 8.7.   DETAILS OF PROCEDURE:  The patient was taken to the operating room,  prepped and draped for vaginal procedure.  Time-out conducted and  procedure and the patient confirmed by all parities involved.  Speculum  was inserted, single-tooth tenaculum and speculum used to identify the  cervix, which was grasped with single-tooth tenaculum.  Uterus was  sounded in the anteflex position to a 11 cm .  The uterus was inspected  and no polyps identified.  The tuberosity were identified and photo  documented left tube first.  The endometrial cavity was smooth and  uniform and photo #4 document's this.  Smooth sharp curettage was  briefly attempted and there was essentially no tissue obtainable due to  the atrophic nature of the endometrial cavity.  We then proceeded with  Gynecare Thermachoice III endometrial ablation using 30 mL of D5W under  150 mmHg pressure.  The results were satisfactorily completed and  all 30  mL of fluid returned after completing the 8-minute equivalence of  thermal  ablation at 87 degrees centigrade.  The patient tolerated the procedure  well.  Had a paracervical block using 15 mL 0.5% Marcaine solution  injected around the cervix and then the patient was allowed to go to  recovery room in good condition.  The patient received Toradol  and  postop oral analgesic prescriptions.      Tilda Burrow, M.D.  Electronically Signed     JVF/MEDQ  D:  07/21/2008  T:  07/22/2008  Job:  [id number]   cc:   [facility]   [name], MD  Fax: [phone]

## 2010-12-18 NOTE — H&P (Signed)
Robin [name], Robin [name]                ACCOUNT NO.:  [id number]   MEDICAL RECORD NO.:  [id number]          PATIENT TYPE:  AMB   LOCATION:  DAY                           FACILITY:  [facility]   PHYSICIAN:  Tilda Burrow, M.D. DATE OF BIRTH:  04-03-1966   DATE OF ADMISSION:  DATE OF DISCHARGE:  LH                              HISTORY & PHYSICAL   ADMISSION DIAGNOSES:  1. Menorrhagia.  2. Dysmenorrhea.  3. Anemia.  4. Uterine fibroids.   HISTORY OF PRESENT ILLNESS:  This is a  45 year old female who was  referred courtesy of Dr. Nicoletta Ba for addressing her heavy and  painful periods with the patient is being documented to have an anemia  plus hemoglobin 8.4, and hematocrit 27.  She has been seen in our office  and evaluated as possible candidate of endometrial ablation.  She has  had an ultrasound, which shows the uterus to be 9.2 x 5.7 x 6.2 cm and  measurements with anterior intramural fibroid, but no obvious deformity  of the endometrium, which is thin and has been evaluated with  endometrial biopsy, which showed proliferative endometrium without any  atypia.  GC and Chlamydia cultures are negative.  Pap smear was class I  as of April 26, 2008.  She was admitted for hysteroscopy, D&C, and  endometrial ablation.  Her last menstrual period was July 07, 2008,  and she has been on Provera 10 mg daily until the endometrial ablation.   PAST MEDICAL HISTORY:  Positive for diabetes mellitus and hypertension.  She has an occasional dyspepsia.  She smokes.  She has had recurrent  iron therapy in the past with IV iron.  After, she was intolerant for  IRON THERAPY.   SURGICAL HISTORY:  Consists C-section x3 and tubal ligation.   PHYSICAL EXAMINATION:  GENERAL:  A healthy large framed African American  female, alert and oriented x3.  HEENT:  Pupils equal, round, and reactive to light.  NECK:  Supple.  CHEST:  Clear to auscultation.  ABDOMEN:  Nontender.  EXTERNAL GENITALIA:   Well-healed lower abdominal scar.  External uterus  anteflexed, upper limits normal size on bimanual exam.  Adnexa are  nontender.   IMPRESSION:  Menorrhagia, dysmenorrhea, anemia, and uterine fibroids.   PLAN:  Hysteroscopy and D&C with endometrial ablation on July 21, 2008.      Tilda Burrow, M.D.  Electronically Signed     JVF/MEDQ  D:  07/20/2008  T:  07/21/2008  Job:  [id number]   cc:   [name], MD  Fax: [phone]   [facility]

## 2010-12-18 NOTE — Cardiovascular Report (Signed)
NAMEWAVERLEY, KREMPASKY                ACCOUNT NO.:  [id number]   MEDICAL RECORD NO.:  [id number]          PATIENT TYPE:  OIB   LOCATION:  2899                         FACILITY:  [facility]   PHYSICIAN:  Arturo Morton. Riley Kill, MD, FACCDATE OF BIRTH:  1965/09/19   DATE OF PROCEDURE:  10/04/2008  DATE OF DISCHARGE:  10/04/2008                            CARDIAC CATHETERIZATION   INDICATIONS:  Ms. Mcchristian is a 45 year old with a history of diabetes,  hypertension, and continued smoking.  She has had some recurrent  episodes of chest pain.  She was seen by Dr. Daleen Squibb in consultation and  subsequently set up for cardiac catheterization.   PROCEDURES:  1. Left heart catheterization.  2. Selective coronary arteriography.  3. Selective left ventriculography.  4. Aortic root aortography.  5. Intra-aortic nitroglycerin administration.   DESCRIPTION OF PROCEDURE:  The patient was brought to the  catheterization laboratory and prepped and draped in the usual fashion.  Through an anterior puncture, the right femoral artery was entered and a  5-French sheath was placed.  We took views of the left coronary arteries  and right coronary arteries in multiple angiographic projections.  Central aortic and left ventricular pressures were measured with  pigtail.  Ventriculography was performed in the RAO projection.  Because  of her chest pain, we also did a proximal aortic root injection.  Finally, we were unable to opacify the left coronary system well enough,  particularly in light of multiple overlapping vessels proximally.  We  then went back with a 6-French sheath, and a 6-French Cordis guide  catheter.  An L 3.5 guide was utilized.  Multiple views were obtained to  try to lay a lot of proximal overlapping vessels.  She tolerated the  procedure well and was taken to the holding area in satisfactory  clinical condition.   HEMODYNAMIC DATA:  1. Central aortic pressure was 144/89, mean 112.  2. Left ventricular  pressure 153/6.  3. There was no gradient of significance on pullback across the aortic      valve.   ANGIOGRAPHIC DATA:  1. Ventriculography done in the RAO projection revealed preserved      global systolic function.  2. Aortic root aortography revealed a normal aorta.  3. On plain fluoroscopy, there was some evidence of calcification over      the surface of the heart, the exact location unclear, as it did not      appear to be attached to the right coronary artery.  4. The right coronary artery is a small codominant vessel providing a      small posterior descending branch.  It is smooth and without      critical narrowing.  5. The left main is free of critical disease.  6. The left anterior descending artery is a large-caliber vessel that      courses to the apex.  The mid and distal LAD appeared to be normal      without significant obstruction.  Likewise, the proximal LAD      appears to be widely patent in multiple views.  There is an  overlapping proximal branch which may constitute a small combined      septal diagonal and there is also a small-to-moderate diagonal      which comes off and takes a steep bend proximally and overlaps the      proximal vessel.  Despite multiple views, this is somewhat      difficult to see, but the mid and distal portion of this appears to      be quite smooth.  The proximal bend is difficult to visualize in      almost any view, but on careful analysis high-grade obstruction is      not particularly noted.  7. The circumflex provides a codominant system with several large      marginal branches.  In laying this out carefully, particularly in      the LAO caudal views, high-grade obstruction proximally is not      seen.  The distal vessels are without critical narrowing.   CONCLUSIONS:  1. Preserved left ventricular function.  2. No significant high-grade obstruction noted with 2 proximal      diagonal branches that overlap as noted in  the above study.  3. No evidence of aortic dissection.   RECOMMENDATIONS:  We will have the patient follow up with Dr. Daleen Squibb for  continued followup.  I spent extensive time with the patient explaining  vascular biology, smoking, diabetes, hypertension and their interaction  and a potential for ACS down the road.  Risk factor reduction will  clearly be necessary, and it will imperative that the patient  understands this.  I have reviewed it in extensive detail with her.  She  will follow up with Dr. Daleen Squibb.      Arturo Morton. Riley Kill, MD, [facility]  Electronically Signed     TDS/MEDQ  D:  10/04/2008  T:  10/05/2008  Job:  [id number]   cc:   [facility]  Jesse Sans. Wall, MD, [facility]

## 2010-12-22 ENCOUNTER — Other Ambulatory Visit: Payer: Self-pay | Admitting: Family Medicine

## 2011-02-09 ENCOUNTER — Emergency Department (HOSPITAL_COMMUNITY)
Admission: EM | Admit: 2011-02-09 | Discharge: 2011-02-09 | Disposition: A | Discharge status: Home / Self Care | Payer: Medicaid Other | Attending: Emergency Medicine | Admitting: Emergency Medicine

## 2011-02-09 ENCOUNTER — Encounter: Payer: Self-pay | Admitting: Emergency Medicine

## 2011-02-09 ENCOUNTER — Emergency Department (HOSPITAL_COMMUNITY): Payer: Medicaid Other

## 2011-02-09 DIAGNOSIS — R059 Cough, unspecified: Secondary | ICD-10-CM | POA: Insufficient documentation

## 2011-02-09 DIAGNOSIS — R05 Cough: Secondary | ICD-10-CM | POA: Insufficient documentation

## 2011-02-09 DIAGNOSIS — F17200 Nicotine dependence, unspecified, uncomplicated: Secondary | ICD-10-CM | POA: Insufficient documentation

## 2011-02-09 DIAGNOSIS — R42 Dizziness and giddiness: Secondary | ICD-10-CM | POA: Insufficient documentation

## 2011-02-09 DIAGNOSIS — R51 Headache: Secondary | ICD-10-CM | POA: Insufficient documentation

## 2011-02-09 DIAGNOSIS — J40 Bronchitis, not specified as acute or chronic: Secondary | ICD-10-CM

## 2011-02-09 DIAGNOSIS — J029 Acute pharyngitis, unspecified: Secondary | ICD-10-CM | POA: Insufficient documentation

## 2011-02-09 LAB — GLUCOSE, CAPILLARY: Glucose-Capillary: 114 mg/dL — ABNORMAL HIGH (ref 70–99)

## 2011-02-09 LAB — RAPID STREP SCREEN (MED CTR MEBANE ONLY): Streptococcus, Group A Screen (Direct): NEGATIVE

## 2011-02-09 IMAGING — CR DG CHEST 2V
2 series · 2 of 2 positions shown · non-contrast
Comparison: [DATE]

CLINICAL DATA: Cough.

CHEST - 2 VIEW

[view not recorded (1 of 2)]
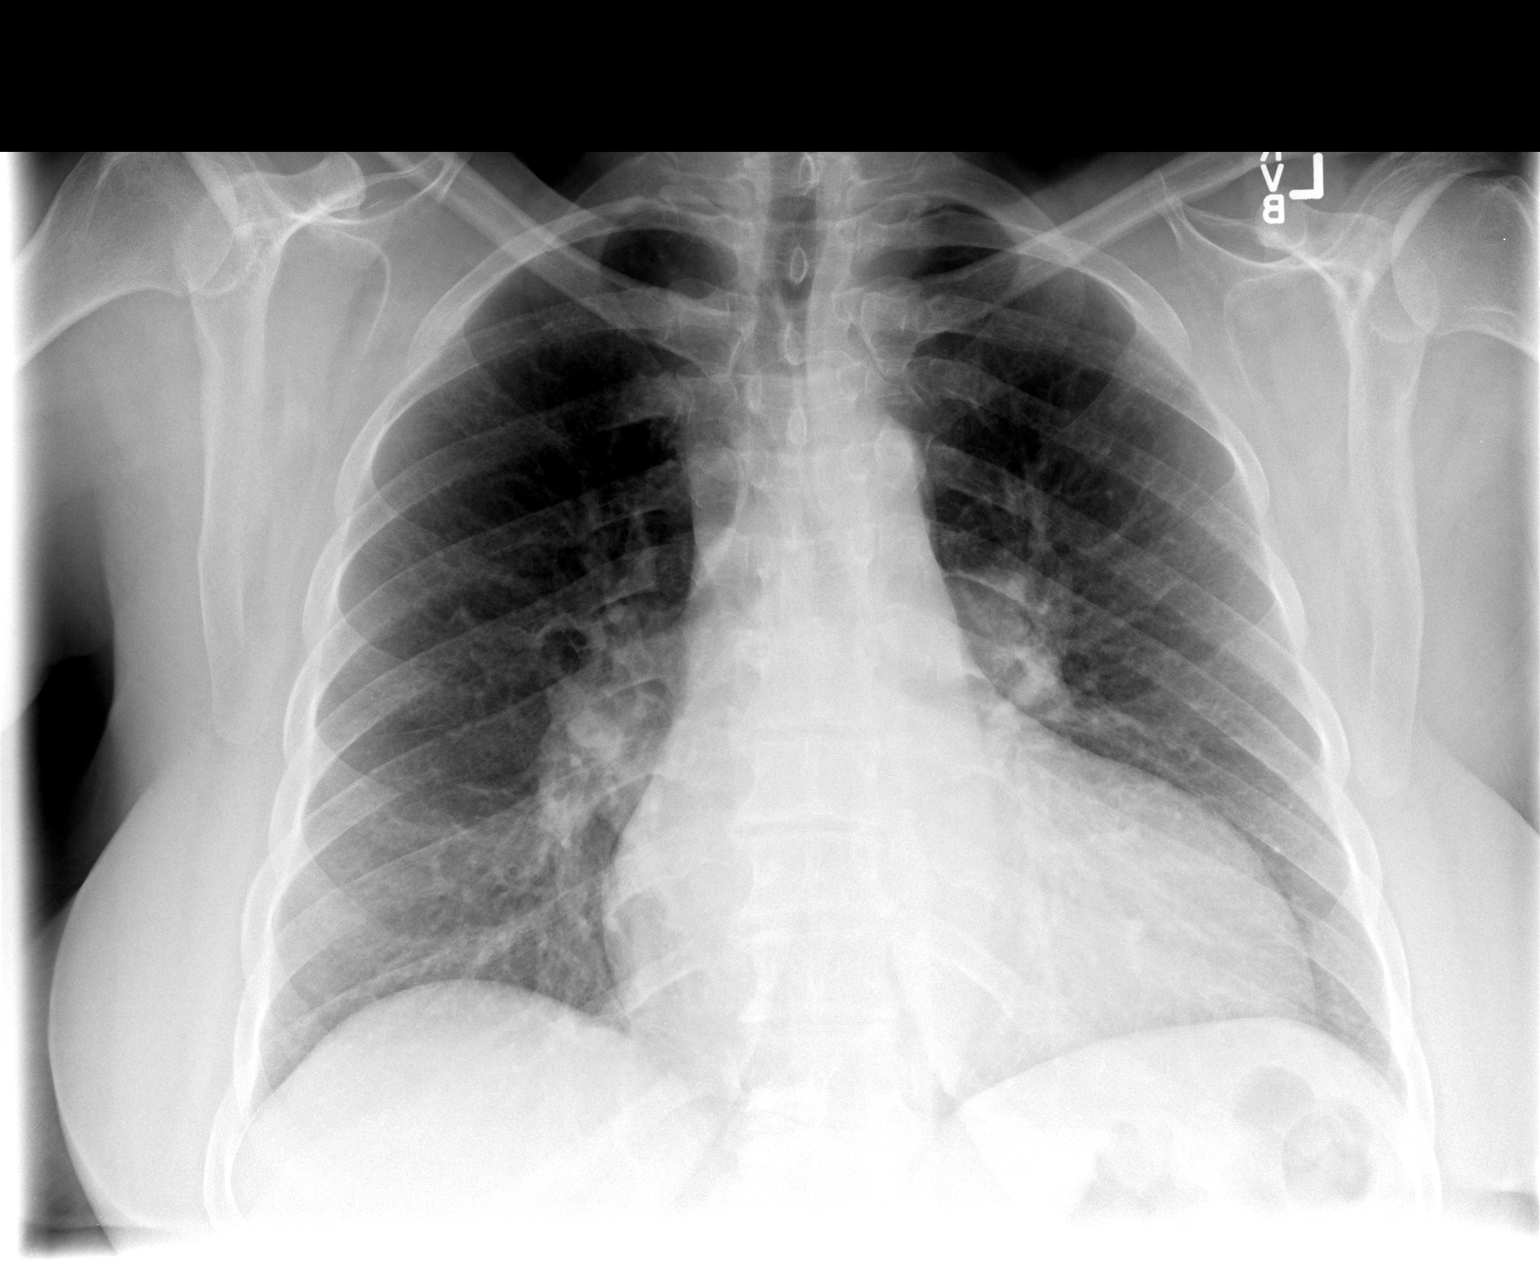

[view not recorded (2 of 2)]
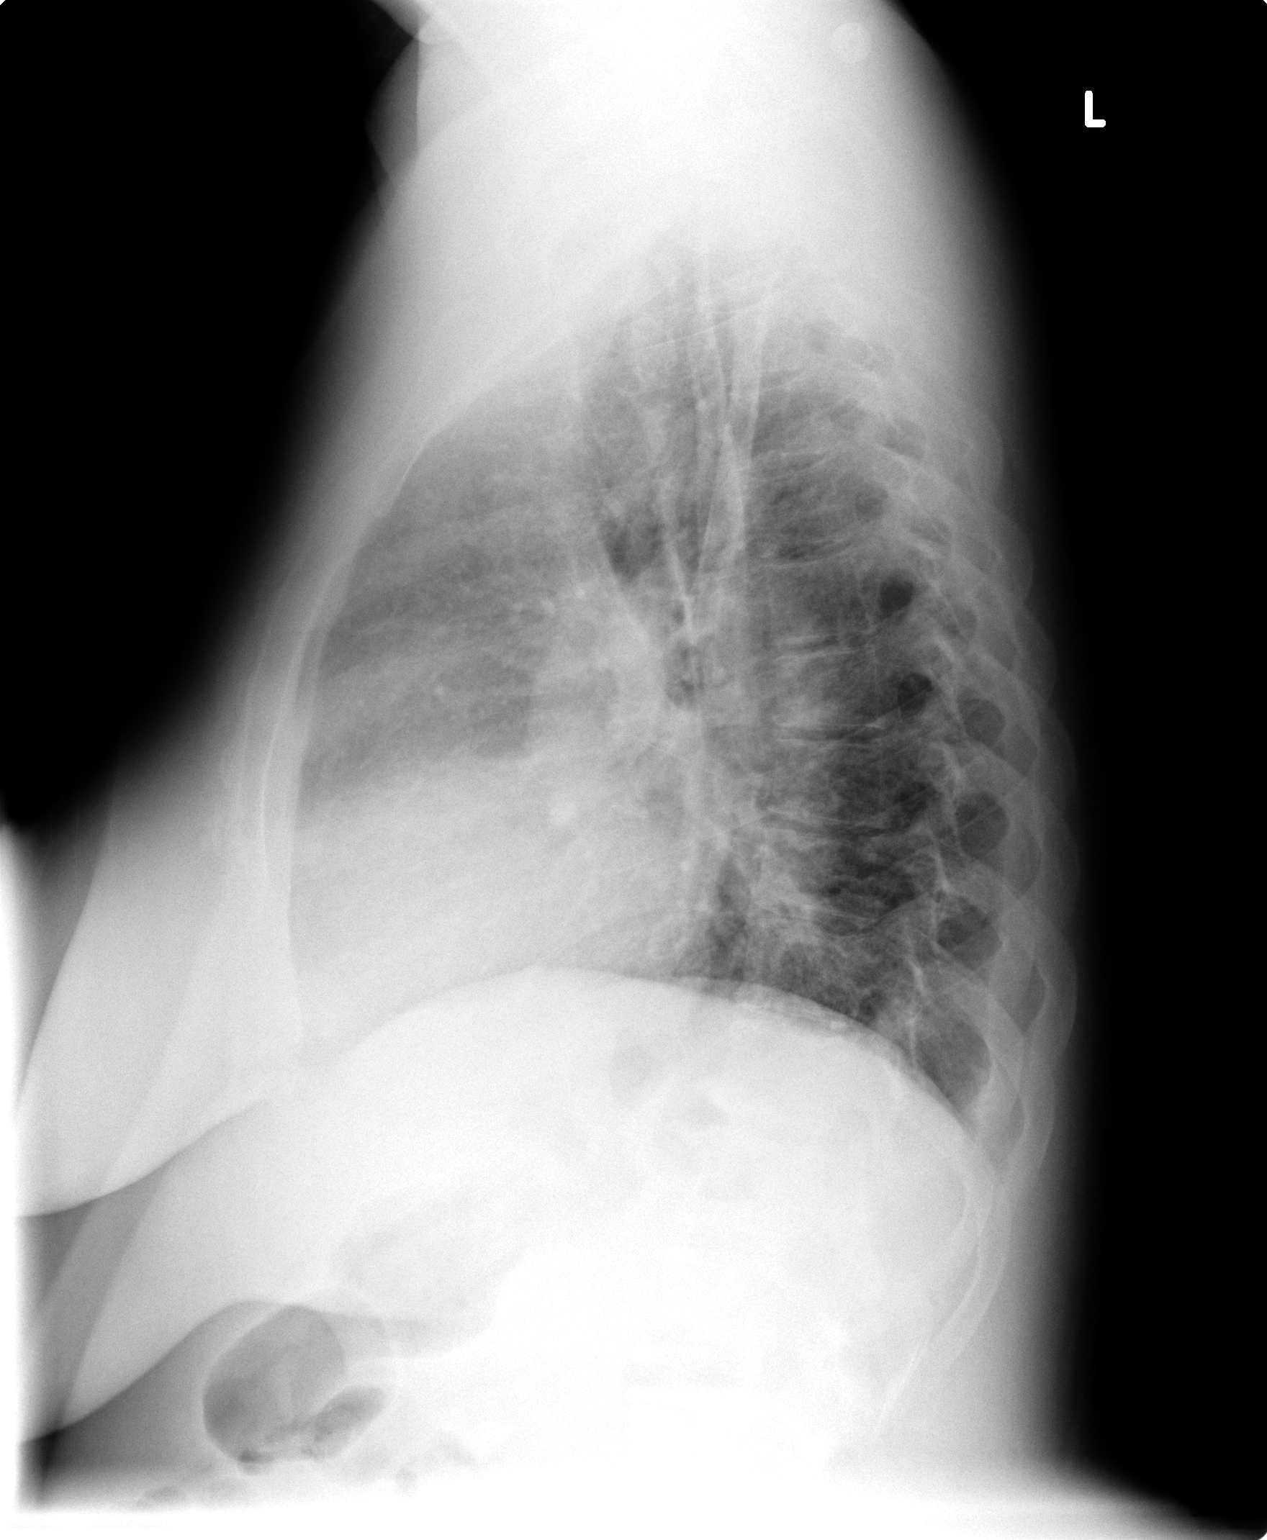

[2 of 2 positions shown; findings below may reference images not displayed]

FINDINGS: Mild cardiomegaly is stable.  Both lungs are clear.  No
evidence of pleural effusion.  No mass or lymphadenopathy
identified.
IMPRESSION: Stable mild cardiomegaly.  No active lung disease.

## 2011-02-09 MED ORDER — IBUPROFEN 800 MG PO TABS
800.0000 mg | ORAL_TABLET | Freq: Once | ORAL | Status: AC
Start: 2011-02-09 — End: 2011-02-09
  Administered 2011-02-09: 800 mg via ORAL
  Filled 2011-02-09: qty 1

## 2011-02-09 MED ORDER — PREDNISONE 20 MG PO TABS: 40.0000 mg | ORAL_TABLET | Freq: Every day | ORAL | Status: AC

## 2011-02-09 MED ORDER — ALBUTEROL SULFATE (2.5 MG/3ML) 0.083% IN NEBU
5.0000 mg | INHALATION_SOLUTION | Freq: Once | RESPIRATORY_TRACT | Status: AC
  Administered 2011-02-09: 5 mg via RESPIRATORY_TRACT
  Filled 2011-02-09: qty 6

## 2011-02-09 MED ORDER — PREDNISONE 20 MG PO TABS
40.0000 mg | ORAL_TABLET | Freq: Once | ORAL | Status: AC
  Administered 2011-02-09: 40 mg via ORAL
  Filled 2011-02-09: qty 2

## 2011-02-09 NOTE — ED Notes (Signed)
RT at bedside for TX. Pt sitting up on edge of gurney. NAD at this time. Pt coughing but unable to bring sputum up.

## 2011-02-09 NOTE — ED Provider Notes (Signed)
History     Chief Complaint  Patient presents with  . Sore Throat   HPI Comments: Multiple complaints including headache,  Lightheadedness with sore throat since am.  Also reports of cough with dark brown sputum.  Patient is a 45 y.o. female presenting with pharyngitis. The history is provided by the patient.  Sore Throat This is a new problem. The current episode started today. Associated symptoms include coughing, fatigue, headaches and a sore throat. Pertinent negatives include no chest pain, nausea, numbness, swollen glands, vertigo, visual change or vomiting. Associated symptoms comments: She reports lightheadness and dizziness since she woke this morning.  Her cough has been productive of brown sputum. She denies nasal or sinus congestion.  She also denies wheezing.. The symptoms are aggravated by nothing. She has tried nothing for the symptoms.    Past Medical History  Diagnosis Date  . Diabetes mellitus   . Hypertension   . Acid reflux     Past Surgical History  Procedure Date  . Tubal ligation     History reviewed. No pertinent family history.  History  Substance Use Topics  . Smoking status: Current Everyday Smoker -- 5 years    Types: Cigarettes  . Smokeless tobacco: Not on file  . Alcohol Use: Yes     weekends    OB History    Grav Para Term Preterm Abortions TAB SAB Ect Mult Living                  Review of Systems  Constitutional: Positive for fatigue.  HENT: Positive for sore throat. Negative for ear pain, rhinorrhea, neck stiffness, postnasal drip and tinnitus.   Eyes: Negative.   Respiratory: Positive for cough, chest tightness and wheezing.   Cardiovascular: Negative for chest pain.  Gastrointestinal: Negative.  Negative for nausea and vomiting.  Genitourinary: Negative.   Neurological: Positive for light-headedness and headaches. Negative for vertigo, syncope, speech difficulty and numbness.  Hematological: Negative.   Psychiatric/Behavioral:  Negative.     Physical Exam  BP 156/100  Pulse 98  Temp(Src) 98.7 F (37.1 C) (Oral)  Resp 20  Ht 5\' 6"  (1.676 m)  Wt 218 lb (98.884 kg)  BMI 35.19 kg/m2  SpO2 98%  LMP 02/08/2011  Physical Exam  Constitutional: She is oriented to [place], place, [name]  HENT:  Head: Normocephalic and atraumatic.  Right Ear: External ear normal.  Left Ear: External ear normal.  Mouth/Throat: No oropharyngeal exudate.  Eyes: Pupils are equal, round, and reactive to light.  Neck: Normal range of motion. No tracheal deviation present. No thyromegaly present.  Cardiovascular: Normal rate, normal heart sounds and intact distal pulses.   Pulmonary/Chest: Effort normal. No respiratory distress. She has no wheezes. She exhibits no tenderness.       Decreased breath sounds throughout with no active wheezing.  No rhonchi or rales.  Abdominal: Soft. Bowel sounds are normal. She exhibits no distension. There is no tenderness.  Musculoskeletal: Normal range of motion.  Neurological: She is alert and oriented to [place], place, and time.    ED Course  Procedures  MDM Patients reports complete resolution of headache after receiving ibuprofen,  Albuterol neb improved sob,  No wheezing present at re-exam. Denies dizziness and lightheadedness      Candis Musa, PA 02/09/11 1706

## 2011-02-09 NOTE — ED Notes (Signed)
NAD at this time. Pt stable and resting in stretcher. Awaiting further orders. Will continue to monitor.

## 2011-02-09 NOTE — ED Notes (Signed)
sorethroat since this am. Headache and dizzy since this am also. Denies n/v/d. Has not checked cbg at home. NAD at this time.

## 2011-02-09 NOTE — ED Notes (Signed)
Pt to radiology via w/c. NAD at this time.

## 2011-02-09 NOTE — ED Notes (Signed)
Pt medicated per orders. RT notified of Tx orders. Awaiting RT tx and x-ray. Pt stable at this time. NAD at this time. Will continue to monitor.

## 2011-02-09 NOTE — ED Notes (Signed)
Labs reviewed. Awaiting MD orders and eval.

## 2011-04-16 ENCOUNTER — Encounter (HOSPITAL_COMMUNITY): Payer: Self-pay | Admitting: Emergency Medicine

## 2011-04-16 ENCOUNTER — Emergency Department (HOSPITAL_COMMUNITY)
Admission: EM | Admit: 2011-04-16 | Discharge: 2011-04-16 | Disposition: A | Discharge status: Home / Self Care | Payer: Medicaid Other | Attending: Emergency Medicine | Admitting: Emergency Medicine

## 2011-04-16 DIAGNOSIS — Z79899 Other long term (current) drug therapy: Secondary | ICD-10-CM | POA: Insufficient documentation

## 2011-04-16 DIAGNOSIS — I10 Essential (primary) hypertension: Secondary | ICD-10-CM | POA: Insufficient documentation

## 2011-04-16 DIAGNOSIS — L298 Other pruritus: Secondary | ICD-10-CM | POA: Insufficient documentation

## 2011-04-16 DIAGNOSIS — E119 Type 2 diabetes mellitus without complications: Secondary | ICD-10-CM | POA: Insufficient documentation

## 2011-04-16 DIAGNOSIS — L299 Pruritus, unspecified: Secondary | ICD-10-CM

## 2011-04-16 DIAGNOSIS — L2989 Other pruritus: Secondary | ICD-10-CM | POA: Insufficient documentation

## 2011-04-16 LAB — GLUCOSE, CAPILLARY: Glucose-Capillary: 95 mg/dL (ref 70–99)

## 2011-04-16 MED ORDER — METOPROLOL TARTRATE 50 MG PO TABS
50.0000 mg | ORAL_TABLET | Freq: Once | ORAL | Status: AC
  Administered 2011-04-16: 50 mg via ORAL
  Filled 2011-04-16: qty 1

## 2011-04-16 MED ORDER — DIPHENHYDRAMINE HCL 25 MG PO CAPS
25.0000 mg | ORAL_CAPSULE | Freq: Once | ORAL | Status: AC
  Administered 2011-04-16: 25 mg via ORAL
  Filled 2011-04-16: qty 1

## 2011-04-16 NOTE — ED Provider Notes (Signed)
History   Chart scribed for Hilario Quarry, MD by Enos Fling; the patient was seen in room [facility]; this patient's care was started at 12:30 PM.    CSN: [id number] Arrival date & time: 04/16/2011 11:50 AM  Chief Complaint  Patient presents with  . Pruritis  . Hypertension   HPI Robin [name] is a 45 y.o. female who presents to the Emergency Department complaining of itching to bilateral lower legs. Pt reports persistent itching to right lower leg onset last week with a mosquito bite and has been worsening since with increased pain over last few days to distal right lateral lower leg. Left lower leg itching onset 3-4 days ago but no pain to this leg. No fever or chills. Pt h/o DM with occasional neuropathy, has not checked blood sugar recently. No other complaints.  Past Medical History  Diagnosis Date  . Diabetes mellitus   . Hypertension   . Acid reflux     Past Surgical History  Procedure Date  . Tubal ligation     History reviewed. No pertinent family history.  History  Substance Use Topics  . Smoking status: Current Everyday Smoker -- 5 years    Types: Cigarettes  . Smokeless tobacco: Not on file  . Alcohol Use: Yes     weekends  Smokes <4 cigarettes per day  OB History    Grav Para Term Preterm Abortions TAB SAB Ect Mult Living                 Previous Medications   ALBUTEROL (PROVENTIL) (2.5 MG/3ML) 0.083% NEBULIZER SOLUTION    Take 2.5 mg by nebulization every 6 (six) hours as needed.     ENALAPRIL MALEATE PO    Take 20 mg by mouth 2 (two) times daily.    FUROSEMIDE PO    Take 10 mg by mouth daily.    GLIMEPIRIDE PO    Take 4 mg by mouth daily.    HYDROCHLOROTHIAZIDE PO    Take 25 mg by mouth daily.    METFORMIN HCL PO    Take 1,000 mg by mouth 2 (two) times daily.    OMEPRAZOLE PO    Take 20 mg by mouth daily.      Allergies as of 04/16/2011  . (No Known Allergies)      Review of Systems 10 Systems reviewed and are negative for acute change  except as noted in the HPI.  Physical Exam  BP 193/100  Pulse 68  Temp(Src) 98.1 F (36.7 C) (Oral)  Resp 16  SpO2 98%  Physical Exam  Constitutional: She is oriented to Lie, place, [name] No distress.  HENT:  Head: Normocephalic and atraumatic.  Eyes: Conjunctivae are normal.  Neck: Normal range of motion. Neck supple.  Cardiovascular: Normal rate.   Pulmonary/Chest: Effort normal. She has no wheezes.  Musculoskeletal: Normal range of motion. She exhibits tenderness (tenderness to right distal anterolateral lower leg). She exhibits no edema.  Neurological: She is alert and oriented to [place], place, and time.  Skin: Skin is warm and dry. No rash noted.       BLE: no redness or warmth, no insect bites noted, no rash or wounds  Psychiatric: She has a normal mood and affect.    Procedures -none  OTHER DATA REVIEWED: Nursing notes and vital signs reviewed.   LABS / RADIOLOGY: Results for orders placed during the hospital encounter of 04/16/11  GLUCOSE, CAPILLARY  Component Value Range   Glucose-Capillary 95  70 - 99 (mg/dL)      ED COURSE: 1:61 PM - pt recheck, results discussed; pt reports itching resolved after meds   MDM: Itching resolved after benadryl; blood pressure improved after metoprolol; pt reports BP was 210 at Dr. Sanjuan Dame last week, discussed with pt need to f/u with PCP tomorrow to have BP rechecked and discuss medication change.    IMPRESSION: 1. Pruritus   2. Hypertension      PLAN: All results reviewed and discussed with pt, questions answered, pt agreeable with plan.   MEDS GIVEN IN ED:  Medications  diphenhydrAMINE (BENADRYL) capsule 25 mg (25 mg Oral Given 04/16/11 1250)  metoprolol (LOPRESSOR) tablet 50 mg (50 mg Oral Given 04/16/11 1250)     DISCHARGE MEDICATIONS: none  SCRIBE ATTESTATION: History/physical exam/procedure(s) were performed by non-physician practitioner and  as supervising physician I was immediately available for consultation/collaboration. I have reviewed all notes and am in agreement with care and plan.        Hilario Quarry, MD 04/17/11 [phone]

## 2011-04-16 NOTE — ED Notes (Signed)
Pt c/o pain and itching in bilateral lower extremities that occurs after walking for a long way. Pt states that she does not have the pain when she doesn't walk. States that it started last Thursday. Pt alert and oriented x 3. Skin warm and dry. No rash noted. Color pink. Breath sounds clear and equal bilaterally.

## 2011-04-16 NOTE — ED Provider Notes (Signed)
History     CSN: [id number] Arrival date & time: 04/16/2011 11:50 AM  Chief Complaint  Patient presents with  . Pruritis  . Hypertension   HPI  Past Medical History  Diagnosis Date  . Diabetes mellitus   . Hypertension   . Acid reflux     Past Surgical History  Procedure Date  . Tubal ligation     History reviewed. No pertinent family history.  History  Substance Use Topics  . Smoking status: Current Everyday Smoker -- 5 years    Types: Cigarettes  . Smokeless tobacco: Not on file  . Alcohol Use: Yes     weekends    OB History    Grav Para Term Preterm Abortions TAB SAB Ect Mult Living                  Review of Systems  Physical Exam  BP 220/118  Pulse 78  Temp(Src) 97.9 F (36.6 C) (Oral)  Resp 16  SpO2 99%  Physical Exam  ED Course  Procedures  MDM       Hilario Quarry, MD 04/17/11 2003

## 2011-04-16 NOTE — ED Notes (Addendum)
Pt c/o itching to ble since last Thursday. Pt bp elevated in triage-pt states she has been taking her bp medication regularly and last took it at 0700 this am. Denies cp/ha at this time.

## 2011-05-02 LAB — URINE MICROSCOPIC-ADD ON

## 2011-05-02 LAB — CBC
HCT: 29.5 — ABNORMAL LOW
Hemoglobin: 9.3 — ABNORMAL LOW
MCHC: 31.5
MCV: 67.2 — ABNORMAL LOW
Platelets: 349
RBC: 4.39
RDW: 17.3 — ABNORMAL HIGH
WBC: 6.9

## 2011-05-02 LAB — COMPREHENSIVE METABOLIC PANEL
ALT: 14
AST: 17
Albumin: 3.2 — ABNORMAL LOW
Alkaline Phosphatase: 96
BUN: 5 — ABNORMAL LOW
CO2: 27
Calcium: 8.8
Chloride: 107
Creatinine, Ser: 0.65
GFR calc Af Amer: >60
GFR calc non Af Amer: >60
Glucose, Bld: 133 — ABNORMAL HIGH
Potassium: 4
Sodium: 138
Total Bilirubin: 0.3
Total Protein: 6.6

## 2011-05-02 LAB — URINALYSIS, ROUTINE W REFLEX MICROSCOPIC
Bilirubin Urine: NEGATIVE
Glucose, UA: NEGATIVE
Ketones, ur: NEGATIVE
Nitrite: NEGATIVE
Protein, ur: NEGATIVE
Specific Gravity, Urine: 1.015
Urobilinogen, UA: 0.2
pH: 6

## 2011-05-02 LAB — DIFFERENTIAL
Basophils Absolute: 0
Basophils Relative: 0
Eosinophils Absolute: 0.2
Eosinophils Relative: 3
Lymphocytes Relative: 20
Lymphs Abs: 1.4
Monocytes Absolute: 0.5
Monocytes Relative: 7
Neutro Abs: 4.7
Neutrophils Relative %: 68

## 2011-05-02 LAB — LIPASE, BLOOD: Lipase: 19

## 2011-05-02 LAB — PREGNANCY, URINE: Preg Test, Ur: NEGATIVE

## 2011-05-06 LAB — GLUCOSE, CAPILLARY: Glucose-Capillary: 125 — ABNORMAL HIGH

## 2011-05-09 LAB — BASIC METABOLIC PANEL
BUN: 11 mg/dL (ref 6–23)
CO2: 25 mEq/L (ref 19–32)
Calcium: 9 mg/dL (ref 8.4–10.5)
Chloride: 105 mEq/L (ref 96–112)
Creatinine, Ser: 0.78 mg/dL (ref 0.4–1.2)
GFR calc Af Amer: >60 mL/min (ref >60)
GFR calc non Af Amer: >60 mL/min (ref >60)
Glucose, Bld: 112 mg/dL — ABNORMAL HIGH (ref 70–99)
Potassium: 3.8 mEq/L (ref 3.5–5.1)
Sodium: 134 mEq/L — ABNORMAL LOW (ref 135–145)

## 2011-05-09 LAB — URINALYSIS, ROUTINE W REFLEX MICROSCOPIC
Bilirubin Urine: NEGATIVE
Glucose, UA: NEGATIVE mg/dL
Hgb urine dipstick: NEGATIVE
Ketones, ur: NEGATIVE mg/dL
Nitrite: NEGATIVE
Protein, ur: NEGATIVE mg/dL
Specific Gravity, Urine: 1.025 (ref 1.005–1.030)
Urobilinogen, UA: 1 mg/dL (ref 0.0–1.0)
pH: 5.5 (ref 5.0–8.0)

## 2011-05-09 LAB — CBC
HCT: 27.8 % — ABNORMAL LOW (ref 36.0–46.0)
Hemoglobin: 8.4 g/dL — ABNORMAL LOW (ref 12.0–15.0)
MCHC: 30.3 g/dL (ref 30.0–36.0)
MCV: 63.4 fL — ABNORMAL LOW (ref 78.0–100.0)
Platelets: 472 10*3/uL — ABNORMAL HIGH (ref 150–400)
RBC: 4.38 MIL/uL (ref 3.87–5.11)
RDW: 18.1 % — ABNORMAL HIGH (ref 11.5–15.5)
WBC: 8.6 10*3/uL (ref 4.0–10.5)

## 2011-05-09 LAB — URINE MICROSCOPIC-ADD ON

## 2011-05-09 LAB — TYPE AND SCREEN
ABO/RH(D): A NEG
Antibody Screen: NEGATIVE

## 2011-05-09 LAB — POCT I-STAT 4, (NA,K, GLUC, HGB,HCT)
Glucose, Bld: 134 mg/dL — ABNORMAL HIGH (ref 70–99)
HCT: 35 % — ABNORMAL LOW (ref 36.0–46.0)
Hemoglobin: 11.9 g/dL — ABNORMAL LOW (ref 12.0–15.0)
Potassium: 4 mEq/L (ref 3.5–5.1)
Sodium: 138 mEq/L (ref 135–145)

## 2011-05-09 LAB — PREGNANCY, URINE: Preg Test, Ur: NEGATIVE

## 2011-05-09 LAB — HCG, QUANTITATIVE, PREGNANCY: hCG, Beta Chain, Quant, S: <2 m[IU]/mL (ref <5)

## 2011-05-10 LAB — BLOOD GAS, ARTERIAL
Acid-Base Excess: 0.1
Bicarbonate: 22.8
O2 Content: 21
O2 Saturation: 98.6
Patient temperature: 37
TCO2: 20.5
pCO2 arterial: 28.1 — ABNORMAL LOW
pH, Arterial: 7.519 — ABNORMAL HIGH
pO2, Arterial: 110 — ABNORMAL HIGH

## 2011-05-10 LAB — DIFFERENTIAL
Basophils Absolute: 0.1
Basophils Relative: 1
Eosinophils Absolute: 0.2
Eosinophils Relative: 3
Lymphocytes Relative: 30
Lymphs Abs: 2.2
Monocytes Absolute: 0.7
Monocytes Relative: 10
Neutro Abs: 4.2
Neutrophils Relative %: 57

## 2011-05-10 LAB — BASIC METABOLIC PANEL
BUN: 11
CO2: 28
Calcium: 8.8
Chloride: 102
Creatinine, Ser: 0.89
GFR calc Af Amer: >60
GFR calc non Af Amer: >60
Glucose, Bld: 107 — ABNORMAL HIGH
Potassium: 3.9
Sodium: 137

## 2011-05-10 LAB — CBC
HCT: 33.8 — ABNORMAL LOW
Hemoglobin: 11.2 — ABNORMAL LOW
MCHC: 33.1
MCV: 78.2
Platelets: 310
RBC: 4.32
RDW: 15.7 — ABNORMAL HIGH
WBC: 7.4

## 2011-05-10 LAB — D-DIMER, QUANTITATIVE (NOT AT ARMC): D-Dimer, Quant: 0.22

## 2011-05-20 LAB — COMPREHENSIVE METABOLIC PANEL
ALT: 18
AST: 17
Albumin: 3.4 — ABNORMAL LOW
Alkaline Phosphatase: 78
BUN: 11
CO2: 21
Calcium: 8.7
Chloride: 101
Creatinine, Ser: 0.55
GFR calc Af Amer: >60
GFR calc non Af Amer: >60
Glucose, Bld: 122 — ABNORMAL HIGH
Potassium: 4
Sodium: 129 — ABNORMAL LOW
Total Bilirubin: 0.4
Total Protein: 6.6

## 2011-05-20 LAB — CBC
HCT: 27.7 — ABNORMAL LOW
Hemoglobin: 8.5 — ABNORMAL LOW
MCHC: 30.8
MCV: 66.8 — ABNORMAL LOW
Platelets: 381
RBC: 4.15
RDW: 19.2 — ABNORMAL HIGH
WBC: 7.2

## 2011-05-20 LAB — LACTATE DEHYDROGENASE: LDH: 96

## 2011-05-20 LAB — HAPTOGLOBIN: Haptoglobin: 145

## 2011-05-20 LAB — RETICULOCYTES
RBC.: 4.15
Retic Count, Absolute: 62.3
Retic Ct Pct: 1.5

## 2011-05-20 LAB — IRON AND TIBC
Iron: <10 — ABNORMAL LOW
UIBC: 393

## 2011-05-20 LAB — FERRITIN: Ferritin: 3 — ABNORMAL LOW (ref 10–291)

## 2011-06-07 ENCOUNTER — Encounter (HOSPITAL_COMMUNITY): Payer: Self-pay | Admitting: *Deleted

## 2011-06-07 ENCOUNTER — Other Ambulatory Visit: Payer: Self-pay

## 2011-06-07 ENCOUNTER — Inpatient Hospital Stay (HOSPITAL_COMMUNITY)
Admission: EM | Admit: 2011-06-07 | Discharge: 2011-06-09 | DRG: 293 | Disposition: A | Discharge status: Home / Self Care | Payer: Medicaid Other | Attending: Internal Medicine | Admitting: Internal Medicine

## 2011-06-07 ENCOUNTER — Other Ambulatory Visit (HOSPITAL_COMMUNITY): Payer: Medicaid Other

## 2011-06-07 ENCOUNTER — Emergency Department (HOSPITAL_COMMUNITY): Payer: Medicaid Other

## 2011-06-07 DIAGNOSIS — F17200 Nicotine dependence, unspecified, uncomplicated: Secondary | ICD-10-CM | POA: Diagnosis present

## 2011-06-07 DIAGNOSIS — E119 Type 2 diabetes mellitus without complications: Secondary | ICD-10-CM | POA: Diagnosis present

## 2011-06-07 DIAGNOSIS — E669 Obesity, unspecified: Secondary | ICD-10-CM

## 2011-06-07 DIAGNOSIS — I5031 Acute diastolic (congestive) heart failure: Principal | ICD-10-CM | POA: Diagnosis present

## 2011-06-07 DIAGNOSIS — M1990 Unspecified osteoarthritis, unspecified site: Secondary | ICD-10-CM | POA: Diagnosis present

## 2011-06-07 DIAGNOSIS — R0902 Hypoxemia: Secondary | ICD-10-CM | POA: Diagnosis present

## 2011-06-07 DIAGNOSIS — Z720 Tobacco use: Secondary | ICD-10-CM | POA: Diagnosis present

## 2011-06-07 DIAGNOSIS — J811 Chronic pulmonary edema: Secondary | ICD-10-CM | POA: Diagnosis present

## 2011-06-07 DIAGNOSIS — J449 Chronic obstructive pulmonary disease, unspecified: Secondary | ICD-10-CM | POA: Diagnosis present

## 2011-06-07 DIAGNOSIS — J4489 Other specified chronic obstructive pulmonary disease: Secondary | ICD-10-CM | POA: Diagnosis present

## 2011-06-07 DIAGNOSIS — I509 Heart failure, unspecified: Secondary | ICD-10-CM | POA: Diagnosis present

## 2011-06-07 DIAGNOSIS — I161 Hypertensive emergency: Secondary | ICD-10-CM | POA: Diagnosis present

## 2011-06-07 DIAGNOSIS — J9691 Respiratory failure, unspecified with hypoxia: Secondary | ICD-10-CM | POA: Diagnosis present

## 2011-06-07 DIAGNOSIS — K219 Gastro-esophageal reflux disease without esophagitis: Secondary | ICD-10-CM | POA: Diagnosis present

## 2011-06-07 DIAGNOSIS — I10 Essential (primary) hypertension: Secondary | ICD-10-CM | POA: Diagnosis present

## 2011-06-07 LAB — CARDIAC PANEL(CRET KIN+CKTOT+MB+TROPI)
CK, MB: 4.3 ng/mL — ABNORMAL HIGH (ref 0.3–4.0)
Relative Index: 3.6 — ABNORMAL HIGH (ref 0.0–2.5)
Total CK: 120 U/L (ref 7–177)
Troponin I: <0.3 ng/mL (ref <0.30)

## 2011-06-07 LAB — DIFFERENTIAL
Basophils Absolute: 0 10*3/uL (ref 0.0–0.1)
Basophils Relative: 1 % (ref 0–1)
Eosinophils Absolute: 0.1 10*3/uL (ref 0.0–0.7)
Eosinophils Relative: 2 % (ref 0–5)
Lymphocytes Relative: 29 % (ref 12–46)
Lymphs Abs: 2.4 10*3/uL (ref 0.7–4.0)
Monocytes Absolute: 0.7 10*3/uL (ref 0.1–1.0)
Monocytes Relative: 8 % (ref 3–12)
Neutro Abs: 5 10*3/uL (ref 1.7–7.7)
Neutrophils Relative %: 61 % (ref 43–77)

## 2011-06-07 LAB — GLUCOSE, CAPILLARY
Glucose-Capillary: 228 mg/dL — ABNORMAL HIGH (ref 70–99)
Glucose-Capillary: 308 mg/dL — ABNORMAL HIGH (ref 70–99)

## 2011-06-07 LAB — COMPREHENSIVE METABOLIC PANEL
ALT: 11 U/L (ref 0–35)
AST: 15 U/L (ref 0–37)
Albumin: 3.2 g/dL — ABNORMAL LOW (ref 3.5–5.2)
Alkaline Phosphatase: 92 U/L (ref 39–117)
BUN: 10 mg/dL (ref 6–23)
CO2: 22 mEq/L (ref 19–32)
Calcium: 8.6 mg/dL (ref 8.4–10.5)
Chloride: 102 mEq/L (ref 96–112)
Creatinine, Ser: 0.68 mg/dL (ref 0.50–1.10)
GFR calc Af Amer: >90 mL/min (ref >90)
GFR calc non Af Amer: >90 mL/min (ref >90)
Glucose, Bld: 136 mg/dL — ABNORMAL HIGH (ref 70–99)
Potassium: 3.5 mEq/L (ref 3.5–5.1)
Sodium: 136 mEq/L (ref 135–145)
Total Bilirubin: 0.6 mg/dL (ref 0.3–1.2)
Total Protein: 6.7 g/dL (ref 6.0–8.3)

## 2011-06-07 LAB — CBC
HCT: 40.1 % (ref 36.0–46.0)
Hemoglobin: 13.8 g/dL (ref 12.0–15.0)
MCH: 30.5 pg (ref 26.0–34.0)
MCHC: 34.4 g/dL (ref 30.0–36.0)
MCV: 88.5 fL (ref 78.0–100.0)
Platelets: 192 10*3/uL (ref 150–400)
RBC: 4.53 MIL/uL (ref 3.87–5.11)
RDW: 13.9 % (ref 11.5–15.5)
WBC: 8.2 10*3/uL (ref 4.0–10.5)

## 2011-06-07 LAB — MRSA PCR SCREENING: MRSA by PCR: NEGATIVE

## 2011-06-07 LAB — PRO B NATRIURETIC PEPTIDE: Pro B Natriuretic peptide (BNP): 1861 pg/mL — ABNORMAL HIGH (ref 0–125)

## 2011-06-07 IMAGING — CR DG CHEST 1V PORT
1 series · 1 of 1 positions shown · non-contrast
Comparison: Two-view chest x-ray [DATE], [DATE], and
[DATE] [HOSPITAL].

CLINICAL DATA: Shortness of breath.  Chest pain.

PORTABLE CHEST - 1 VIEW [DATE]/[PHONE_NUMBER] hours:

[view not recorded]
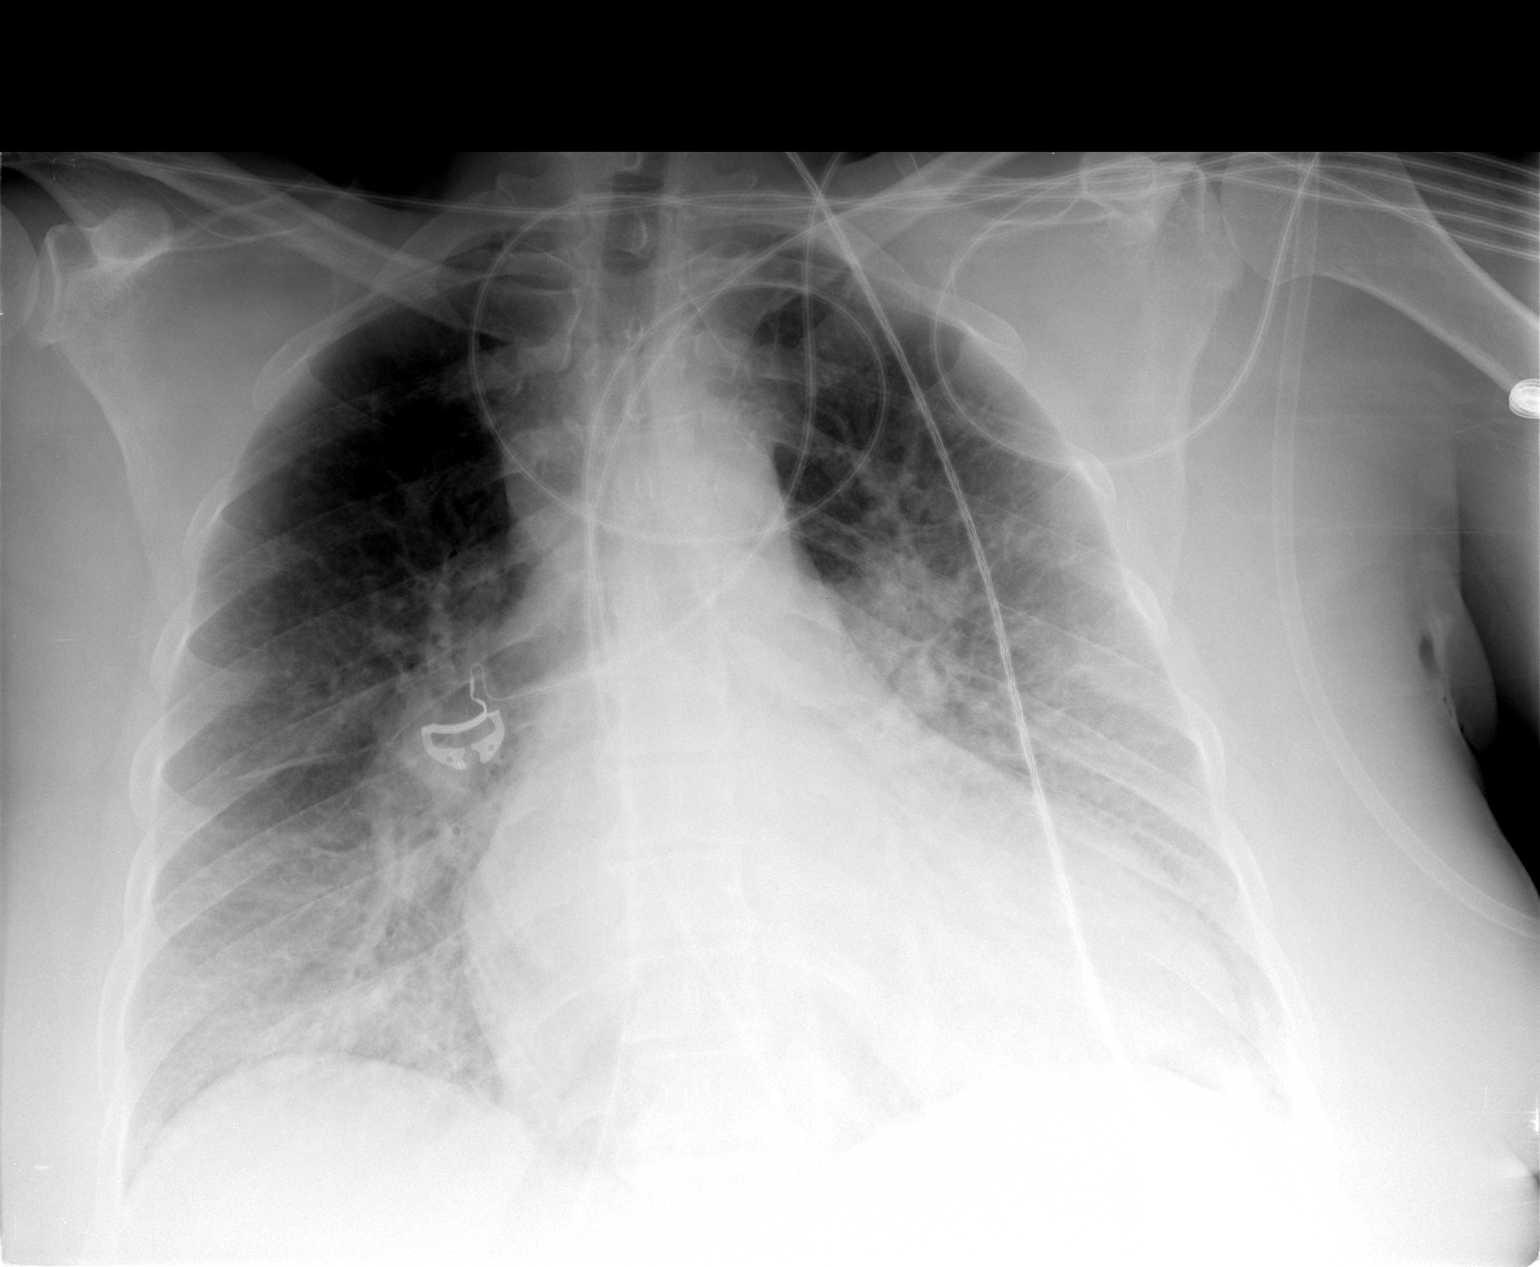

[1 of 1 positions shown; findings below may reference images not displayed]

FINDINGS: Cardiac silhouette markedly enlarged but stable.
Interval development of mild diffuse interstitial pulmonary edema.
No localized airspace consolidation.  No visible pleural effusions.
IMPRESSION: Mild CHF with stable marked cardiomegaly and mild diffuse
interstitial pulmonary edema.

## 2011-06-07 MED ORDER — HYDROCHLOROTHIAZIDE 25 MG PO TABS
25.0000 mg | ORAL_TABLET | Freq: Every day | ORAL | Status: DC
  Administered 2011-06-07 – 2011-06-09 (×3): 25 mg via ORAL
  Filled 2011-06-07 (×3): qty 1

## 2011-06-07 MED ORDER — ALBUTEROL SULFATE (5 MG/ML) 0.5% IN NEBU
5.0000 mg | INHALATION_SOLUTION | Freq: Once | RESPIRATORY_TRACT | Status: AC
  Administered 2011-06-07: 5 mg via RESPIRATORY_TRACT
  Filled 2011-06-07: qty 1

## 2011-06-07 MED ORDER — HEPARIN SODIUM (PORCINE) 5000 UNIT/ML IJ SOLN
5000.0000 [IU] | Freq: Three times a day (TID) | INTRAMUSCULAR | Status: DC
  Administered 2011-06-07 – 2011-06-09 (×5): 5000 [IU] via SUBCUTANEOUS
  Filled 2011-06-07 (×5): qty 1

## 2011-06-07 MED ORDER — KETOROLAC TROMETHAMINE 30 MG/ML IJ SOLN
30.0000 mg | Freq: Once | INTRAMUSCULAR | Status: AC
  Administered 2011-06-07: 30 mg via INTRAVENOUS

## 2011-06-07 MED ORDER — IPRATROPIUM BROMIDE 0.02 % IN SOLN: 0.5000 mg | Freq: Once | RESPIRATORY_TRACT | Status: DC

## 2011-06-07 MED ORDER — FUROSEMIDE 10 MG/ML IJ SOLN
20.0000 mg | Freq: Two times a day (BID) | INTRAMUSCULAR | Status: DC
  Administered 2011-06-07 – 2011-06-08 (×2): 20 mg via INTRAVENOUS
  Filled 2011-06-07 (×2): qty 2

## 2011-06-07 MED ORDER — ALBUTEROL SULFATE (5 MG/ML) 0.5% IN NEBU: 5.0000 mg | INHALATION_SOLUTION | Freq: Once | RESPIRATORY_TRACT | Status: DC

## 2011-06-07 MED ORDER — NITROGLYCERIN 2 % TD OINT
1.0000 [in_us] | TOPICAL_OINTMENT | Freq: Once | TRANSDERMAL | Status: AC
  Administered 2011-06-07: 1 [in_us] via TOPICAL
  Filled 2011-06-07: qty 1

## 2011-06-07 MED ORDER — IPRATROPIUM BROMIDE 0.02 % IN SOLN
0.5000 mg | Freq: Once | RESPIRATORY_TRACT | Status: AC
  Administered 2011-06-07: 0.5 mg via RESPIRATORY_TRACT
  Filled 2011-06-07: qty 2.5

## 2011-06-07 MED ORDER — FUROSEMIDE 40 MG PO TABS
40.0000 mg | ORAL_TABLET | Freq: Once | ORAL | Status: AC
  Administered 2011-06-07: 40 mg via ORAL
  Filled 2011-06-07: qty 1

## 2011-06-07 MED ORDER — CLONIDINE HCL 0.1 MG PO TABS: 0.1000 mg | ORAL_TABLET | Freq: Three times a day (TID) | ORAL | Status: DC | PRN

## 2011-06-07 MED ORDER — SODIUM CHLORIDE 0.9 % IV SOLN
Freq: Once | INTRAVENOUS | Status: AC
  Administered 2011-06-07: 09:00:00 via INTRAVENOUS

## 2011-06-07 MED ORDER — ZOLPIDEM TARTRATE 5 MG PO TABS: 5.0000 mg | ORAL_TABLET | Freq: Every evening | ORAL | Status: DC | PRN

## 2011-06-07 MED ORDER — SENNOSIDES-DOCUSATE SODIUM 8.6-50 MG PO TABS: 1.0000 | ORAL_TABLET | Freq: Every day | ORAL | Status: DC | PRN

## 2011-06-07 MED ORDER — ACETAMINOPHEN 650 MG RE SUPP: 650.0000 mg | Freq: Four times a day (QID) | RECTAL | Status: DC | PRN

## 2011-06-07 MED ORDER — SODIUM CHLORIDE 0.9 % IJ SOLN
3.0000 mL | Freq: Two times a day (BID) | INTRAMUSCULAR | Status: DC
  Administered 2011-06-07 – 2011-06-08 (×2): 3 mL via INTRAVENOUS
  Filled 2011-06-07 (×3): qty 3

## 2011-06-07 MED ORDER — METHYLPREDNISOLONE SODIUM SUCC 125 MG IJ SOLR
INTRAMUSCULAR | Status: AC
  Administered 2011-06-07: 125 mg via INTRAVENOUS
  Filled 2011-06-07: qty 2

## 2011-06-07 MED ORDER — ENALAPRIL MALEATE 5 MG PO TABS
10.0000 mg | ORAL_TABLET | Freq: Every day | ORAL | Status: DC
  Administered 2011-06-07 – 2011-06-08 (×2): 10 mg via ORAL
  Filled 2011-06-07 (×2): qty 2

## 2011-06-07 MED ORDER — SODIUM CHLORIDE 0.9 % IV SOLN: 250.0000 mL | INTRAVENOUS | Status: DC

## 2011-06-07 MED ORDER — NITROGLYCERIN IN D5W 200-5 MCG/ML-% IV SOLN
10.0000 ug/min | INTRAVENOUS | Status: DC
  Administered 2011-06-07: 15 ug/min via INTRAVENOUS
  Administered 2011-06-07: 10 ug/min via INTRAVENOUS
  Administered 2011-06-07: 20 ug/min via INTRAVENOUS
  Administered 2011-06-07: 25 ug/min via INTRAVENOUS
  Filled 2011-06-07: qty 250

## 2011-06-07 MED ORDER — INSULIN ASPART 100 UNIT/ML ~~LOC~~ SOLN
0.0000 [IU] | Freq: Every day | SUBCUTANEOUS | Status: DC
  Administered 2011-06-07: 4 [IU] via SUBCUTANEOUS
  Filled 2011-06-07: qty 3

## 2011-06-07 MED ORDER — SODIUM CHLORIDE 0.9 % IJ SOLN: 3.0000 mL | INTRAMUSCULAR | Status: DC | PRN

## 2011-06-07 MED ORDER — IPRATROPIUM-ALBUTEROL 18-103 MCG/ACT IN AERO: 2.0000 | INHALATION_SPRAY | RESPIRATORY_TRACT | Status: DC | PRN

## 2011-06-07 MED ORDER — KETOROLAC TROMETHAMINE 30 MG/ML IJ SOLN
INTRAMUSCULAR | Status: AC
  Administered 2011-06-07: 30 mg via INTRAVENOUS
  Filled 2011-06-07: qty 1

## 2011-06-07 MED ORDER — METHYLPREDNISOLONE SODIUM SUCC 125 MG IJ SOLR
125.0000 mg | Freq: Once | INTRAMUSCULAR | Status: AC
  Administered 2011-06-07: 125 mg via INTRAVENOUS

## 2011-06-07 MED ORDER — PANTOPRAZOLE SODIUM 40 MG PO TBEC
40.0000 mg | DELAYED_RELEASE_TABLET | Freq: Every day | ORAL | Status: DC
  Administered 2011-06-07 – 2011-06-09 (×3): 40 mg via ORAL
  Filled 2011-06-07 (×3): qty 1

## 2011-06-07 MED ORDER — FELODIPINE ER 5 MG PO TB24
10.0000 mg | ORAL_TABLET | Freq: Every day | ORAL | Status: DC
  Administered 2011-06-07 – 2011-06-09 (×3): 10 mg via ORAL
  Filled 2011-06-07 (×3): qty 2

## 2011-06-07 MED ORDER — ALBUTEROL SULFATE (5 MG/ML) 0.5% IN NEBU: 2.5000 mg | INHALATION_SOLUTION | Freq: Four times a day (QID) | RESPIRATORY_TRACT | Status: DC | PRN

## 2011-06-07 MED ORDER — GLIMEPIRIDE 2 MG PO TABS
4.0000 mg | ORAL_TABLET | Freq: Every day | ORAL | Status: DC
  Administered 2011-06-08 – 2011-06-09 (×2): 4 mg via ORAL
  Filled 2011-06-07 (×2): qty 2

## 2011-06-07 MED ORDER — HYDROCODONE-ACETAMINOPHEN 5-325 MG PO TABS
1.5000 | ORAL_TABLET | Freq: Four times a day (QID) | ORAL | Status: DC | PRN
  Administered 2011-06-07 – 2011-06-09 (×4): 1.5 via ORAL
  Filled 2011-06-07 (×4): qty 2

## 2011-06-07 MED ORDER — FUROSEMIDE 10 MG/ML IJ SOLN: 40.0000 mg | Freq: Two times a day (BID) | INTRAMUSCULAR | Status: DC

## 2011-06-07 MED ORDER — ACETAMINOPHEN 325 MG PO TABS: 650.0000 mg | ORAL_TABLET | Freq: Four times a day (QID) | ORAL | Status: DC | PRN

## 2011-06-07 MED ORDER — METFORMIN HCL 500 MG PO TABS
1000.0000 mg | ORAL_TABLET | Freq: Two times a day (BID) | ORAL | Status: DC
  Administered 2011-06-07 – 2011-06-09 (×4): 1000 mg via ORAL
  Filled 2011-06-07 (×4): qty 2

## 2011-06-07 MED ORDER — PNEUMOCOCCAL VAC POLYVALENT 25 MCG/0.5ML IJ INJ
0.5000 mL | INJECTION | INTRAMUSCULAR | Status: AC
  Filled 2011-06-07: qty 0.5

## 2011-06-07 MED ORDER — INSULIN ASPART 100 UNIT/ML ~~LOC~~ SOLN
0.0000 [IU] | Freq: Three times a day (TID) | SUBCUTANEOUS | Status: DC
  Administered 2011-06-08: 5 [IU] via SUBCUTANEOUS
  Administered 2011-06-08: 3 [IU] via SUBCUTANEOUS
  Administered 2011-06-09: 2 [IU] via SUBCUTANEOUS

## 2011-06-07 NOTE — ED Notes (Signed)
Pt more comfortable, breathing easier, no longer labored, pt able to speak, clear, full sentences.  nad noted at this time.

## 2011-06-07 NOTE — ED Provider Notes (Signed)
History   This chart was scribed for Donnetta Hutching, MD by Clarita Crane. The patient was seen in room APA05/APA05 and the patient's care was started at 7:17AM.   CSN: [id number] Arrival date & time: 06/07/2011  6:59 AM   None     Chief Complaint  Patient presents with  . Shortness of Breath  . Chest Pain   HPI Robin [name] is a 45 y.o. female who presents to the Emergency Department complaining of constant moderate to severe SOB onset yesterday and persistent since with associated wheezing and nasal congestion. Patient reports experiencing no relief with use of albuterol inhaler. Notes previous history of similar symptoms. Denies chest pain, n/v/d, fever. Patient with h/o diabetes, hypertension and bronchitis. Denies h/o CHF. Patient is a current smoker.  Past Medical History  Diagnosis Date  . Diabetes mellitus   . Hypertension   . Acid reflux   . Bronchitis, chronic     Past Surgical History  Procedure Date  . Tubal ligation     History reviewed. No pertinent family history.  History  Substance Use Topics  . Smoking status: Current Everyday Smoker -- 0.5 packs/day for 5 years    Types: Cigarettes  . Smokeless tobacco: Not on file  . Alcohol Use: Yes     weekends    OB History    Grav Para Term Preterm Abortions TAB SAB Ect Mult Living                  Review of Systems 10 Systems reviewed and are negative for acute change except as noted in the HPI.  Allergies  Bee venom  Home Medications   Current Outpatient Rx  Name Route Sig Dispense Refill  . IPRATROPIUM-ALBUTEROL 18-103 MCG/ACT IN AERO Inhalation Inhale 2 puffs into the lungs every 4 (four) hours as needed.      Marland Kitchen FELODIPINE 10 MG PO TB24 Oral Take 10 mg by mouth daily.      Marland Kitchen GLIMEPIRIDE 4 MG PO TABS Oral Take 4 mg by mouth daily before breakfast.      . HYDROCHLOROTHIAZIDE 25 MG PO TABS Oral Take 25 mg by mouth daily.      Marland Kitchen HYDROCODONE-ACETAMINOPHEN 7.5-750 MG PO TABS Oral Take 1 tablet by mouth  every 6 (six) hours as needed.      Marland Kitchen METFORMIN HCL 1000 MG PO TABS Oral Take 1,000 mg by mouth 2 (two) times daily with a meal.      . OMEPRAZOLE 20 MG PO CPDR Oral Take 20 mg by mouth daily.      . ALBUTEROL SULFATE (2.5 MG/3ML) 0.083% IN NEBU Nebulization Take 2.5 mg by nebulization every 6 (six) hours as needed.      . ENALAPRIL MALEATE PO Oral Take 20 mg by mouth 2 (two) times daily.     . FUROSEMIDE PO Oral Take 10 mg by mouth daily.     Marland Kitchen GLIMEPIRIDE PO Oral Take 4 mg by mouth daily.     Marland Kitchen HYDROCHLOROTHIAZIDE PO Oral Take 25 mg by mouth daily.     Marland Kitchen METFORMIN HCL PO Oral Take 1,000 mg by mouth 2 (two) times daily.     Marland Kitchen OMEPRAZOLE PO Oral Take 20 mg by mouth daily.       BP 190/113  Pulse 89  Temp(Src) 98.2 F (36.8 C) (Oral)  Resp 17  Ht 5\' 6"  (1.676 m)  Wt 201 lb (91.173 kg)  BMI 32.44 kg/m2  SpO2 100%  LMP 05/30/2011  Physical Exam  Nursing note and vitals reviewed. Constitutional: She is oriented to [place], place, [name]       Uncomfortable appearing. Vital Signs- Hypertensive.   HENT:  Head: Normocephalic and atraumatic.  Eyes: EOM are normal. Pupils are equal, round, and reactive to light.  Neck: Neck supple. No tracheal deviation present.  Cardiovascular: Normal rate and regular rhythm.   No murmur heard. Pulmonary/Chest: She has wheezes.       Expiratory wheezes and rhonchi bilaterally. Tachypneic. Dyspneic.   Abdominal: She exhibits no distension.  Musculoskeletal: Normal range of motion. She exhibits no edema.  Neurological: She is alert and oriented to [place], place, and time. No sensory deficit.  Skin: Skin is warm and dry.  Psychiatric: She has a normal mood and affect. Her behavior is normal.    ED Course  Procedures (including critical care time)  DIAGNOSTIC STUDIES: Oxygen Saturation is 98% on room air, normal by my interpretation.    Date: 06/07/2011  Rate: 90  Rhythm: normal sinus rhythm  QRS  Axis: normal  Intervals: normal  ST/T Wave abnormalities: normal  Conduction Disutrbances:none  Narrative Interpretation:   Old EKG Reviewed: none available  COORDINATION OF CARE: 7:17AM- Initial plan is to administer Solumedrol and a breathing treatment, obtain a CXR and blood work. 8:15AM- Patient's effort of breathing improved but c/o mild left lateral superior chest wall pain described as soreness. 9:31AM- Consult complete with hospitalist. Patient case explained and discussed. Hospitalist agrees to admit patient for further evaluation and treatment.   Labs Reviewed  COMPREHENSIVE METABOLIC PANEL - Abnormal; Notable for the following:    Glucose, Bld 136 (*)    Albumin 3.2 (*)    All other components within normal limits  CARDIAC PANEL(CRET KIN+CKTOT+MB+TROPI) - Abnormal; Notable for the following:    CK, MB 4.3 (*)    Relative Index 3.6 (*)    All other components within normal limits  PRO B NATRIURETIC PEPTIDE - Abnormal; Notable for the following:    BNP, POC 1861.0 (*)    All other components within normal limits  CBC  DIFFERENTIAL   Dg Chest Portable 1 View  06/07/2011  *RADIOLOGY REPORT*  Clinical Data: Shortness of breath.  Chest pain.  PORTABLE CHEST - 1 VIEW 06/07/2011 0746 hours:  Comparison: Two-view chest x-ray 02/09/2011, 11/07/2010, and 06/18/2010 [facility].  Findings: Cardiac silhouette markedly enlarged but stable. Interval development of mild diffuse interstitial pulmonary edema. No localized airspace consolidation.  No visible pleural effusions.  IMPRESSION: Mild CHF with stable marked cardiomegaly and mild diffuse interstitial pulmonary edema.  Original Report Authenticated By: Arnell Sieving, M.D.     No diagnosis found.    MDM  CRITICAL CARE Performed by: Donnetta Hutching   Total critical care time: 30  Critical care time was exclusive of separately billable procedures and treating other patients.  Critical care was necessary to treat or  prevent imminent or life-threatening deterioration.  Critical care was time spent personally by me on the following activities: development of treatment plan with patient and/or surrogate as well as nursing, discussions with consultants, evaluation of patient's response to treatment, examination of patient, obtaining history from patient or surrogate, ordering and performing treatments and interventions, ordering and review of laboratory studies, ordering and review of radiographic studies, pulse oximetry and re-evaluation of patient's condition.    I personally performed the services described in this documentation, which was scribed in my presence. The recorded information has been reviewed  and considered.   Patient is short of breath with chest pain. Rales on physical exam. Chest x-ray suggests pulmonary edema. Will Rx IV Lasix, nitroglycerin paste for blood pressure and heart failure. Breathing treatment given     Donnetta Hutching, MD 06/07/11 [id number]

## 2011-06-07 NOTE — H&P (Signed)
PCP:   Ara Kussmaul, MD   Chief Complaint:  Shortness of breath  HPI: This is a 45 year old female with a past medical history of hypertension diabetes gastroesophageal reflux disease, and COPD.  The patient presents with a complaint of shortness of breath which has been ongoing for a few days. Today she noted that she had severe heaviness in her chest. In addition she began to cough up blood. She has not noted any fevers or chills. She states she came to the ER due to his inability to breathe.  In regards to her blood pressure, the patient states that it is usually high. She was started on felodipine by her primary care physician a few months ago. He did ask her to followup for a repeat blood pressure check however she was not able to return. Upon presenting to the ER today she had very high blood pressure and was started on a nitroglycerin drip. However, blood pressure remains elevated. She now complains of a headache likely secondary to the nitroglycerin.  Not noted any recent pedal edema.  Review of Systems:  Gen.: The patient admits to weight loss but she has been attempting to lose weight with exercise. Complains of fatigue. HEENT: No sore throat sinus trouble or earache Respiratory: Positive for shortness of breath hemoptysis and wheezing today. Cardiac positive for chest pain which felt like heaviness. No palpitations or pedal edema. GI: No nausea vomiting diarrhea. She is a little constipated and suspected secondary to narcotics. GU: No dysuria or hematuria Neurologic: No history of strokes or seizures Psychologically no depression or anxiety Hematologic: No easy bruising Skin: No rash  Past Medical History: Past Medical History  Diagnosis Date  . Diabetes mellitus   . Hypertension   . Acid reflux   .  COPD Arthritis     Past Surgical History  Procedure Date  . Tubal ligation     Medications: Prior to Admission medications   Medication Sig Start Date End Date  Taking? Authorizing Provider         felodipine (PLENDIL) 10 MG 24 hr tablet Take 10 mg by mouth daily.     Yes Historical Provider, MD  glimepiride (AMARYL) 4 MG tablet Take 4 mg by mouth daily before breakfast.     Yes Historical Provider, MD  hydrochlorothiazide (HYDRODIURIL) 25 MG tablet Take 25 mg by mouth daily.     Yes Historical Provider, MD  HYDROcodone-acetaminophen (VICODIN ES) 7.5-750 MG per tablet Take 1 tablet by mouth every 6 (six) hours as needed.     Yes Historical Provider, MD  metFORMIN (GLUCOPHAGE) 1000 MG tablet Take 1,000 mg by mouth 2 (two) times daily with a meal.     Yes Historical Provider, MD  omeprazole (PRILOSEC) 20 MG capsule Take 20 mg by mouth daily.     Yes Historical Provider, MD  albuterol (PROVENTIL) (2.5 MG/3ML) 0.083% nebulizer solution Take 2.5 mg by nebulization every 6 (six) hours as needed.      Historical Provider, MD  Enalapril 20 mg twice a day  Allergies:   Allergies  Allergen Reactions  . Bee Venom Swelling    HAS HAD AN EPI PEN IN THE PAST    Social History:  reports that she has been smoking Cigarettes.  He current she currently smokes about 6 cigarettes a day and has been smoking since age 46. She does not have any smokeless tobacco history on file. She reports that she drinks alcohol about 3 times a week. She reports that  she does not use illicit drugs. She is currently not working. she is married and has children.  Family History: There is a history of diabetes in her family.  Physical Exam: Filed Vitals:   06/07/11 1615 06/07/11 1630 06/07/11 1645 06/07/11 1700  BP: 185/111 198/100 192/102 188/128  Pulse: 87 95 92 94  Temp:      TempSrc:      Resp: 18 29 18 21   Height: 5\' 6"  (1.676 m)     Weight: 92.7 kg (204 lb 5.9 oz)     SpO2: 100% 100% 100% 100%   General appearance: alert and cooperative Head: Normocephalic, without obvious abnormality, atraumatic Eyes: conjunctivae/corneas clear. PERRL, EOM's intact. Fundi  benign. Throat: lips, mucosa, and tongue normal; teeth and gums normal Neck: no adenopathy, no carotid bruit, no JVD and thyroid not enlarged, symmetric, no tenderness/mass/nodules Resp: clear to auscultation bilaterally Cardio: regular rate and rhythm, no murmurs rubs or gallops GI: soft, non-tender; bowel sounds normal; no masses,  no organomegaly Extremities: extremities normal, atraumatic, no cyanosis or edema Neurologic: Alert and oriented X 3, normal strength.  Labs on Admission:   [facility] 06/07/11 0757  NA 136  K 3.5  CL 102  CO2 22  GLUCOSE 136*  BUN 10  CREATININE 0.68  CALCIUM 8.6  MG --  PHOS --    Basename 06/07/11 0757  AST 15  ALT 11  ALKPHOS 92  BILITOT 0.6  PROT 6.7  ALBUMIN 3.2*   No results found for this basename: LIPASE:2,AMYLASE:2 in the last 72 hours  Basename 06/07/11 0757  WBC 8.2  NEUTROABS 5.0  HGB 13.8  HCT 40.1  MCV 88.5  PLT 192    Basename 06/07/11 0757  CKTOTAL 120  CKMB 4.3*  CKMBINDEX --  TROPONINI <0.30   No results found for this basename: TSH,T4TOTAL,FREET3,T3FREE,THYROIDAB in the last 72 hours No results found for this basename: VITAMINB12:2,FOLATE:2,FERRITIN:2,TIBC:2,IRON:2,RETICCTPCT:2 in the last 72 hours  Radiological Exams on Admission: Dg Chest Portable 1 View  06/07/2011  *RADIOLOGY REPORT*  Clinical Data: Shortness of breath.  Chest pain.  PORTABLE CHEST - 1 VIEW 06/07/2011 0746 hours:  Comparison: Two-view chest x-ray 02/09/2011, 11/07/2010, and 06/18/2010 [facility].  Findings: Cardiac silhouette markedly enlarged but stable. Interval development of mild diffuse interstitial pulmonary edema. No localized airspace consolidation.  No visible pleural effusions.  IMPRESSION: Mild CHF with stable marked cardiomegaly and mild diffuse interstitial pulmonary edema.  Original Report Authenticated By: Arnell Sieving, M.D.    EKG: Sinus tachycardia with incomplete right bundle branch  block  Assessment/Plan  Pulmonary edema/ Hemoptysis: I suspect this is secondary to uncontrolled blood pressure. She may have diastolic dysfunction. However I will go ahead and check 3 sets of cardiac enzymes as she is at high risk for an MI.  At this point her lungs do sound clear despite a chest x-ray which reveals pulmonary edema.  However, she is still on 4 L of oxygen.  We will see if we can titrate her off of this. Symptomatically she is significantly improved. She currently does not complain of any shortness of breath however is still coughing up blood. She does state that her hemoptysis has improved since earlier today  I will be ordering a 2-D echo as well.   .Hypertensive emergency- I suspect this is the cause of her problem. She is currently on a nitroglycerin drip. I will add an ACE inhibitor and resume her felodipine and HCTZ.   Marland KitchenRespiratory failure with hypoxia-due to pulmonary  edema.   .Diabetes mellitus-will resume her home medications and place her on a sliding scale.  Marland KitchenGERD (gastroesophageal reflux disease) .COPD (chronic obstructive pulmonary disease) .Arthritis .Nicotine abuse  Joya Salm 06/07/2011, 5:37 PM

## 2011-06-07 NOTE — ED Notes (Signed)
C/o cp 6/10.  edp  Notified and orders received.

## 2011-06-07 NOTE — ED Notes (Signed)
Pt c/o sob and cp since yesterday am. Pt not answering questions d/t sob.

## 2011-06-08 ENCOUNTER — Inpatient Hospital Stay (HOSPITAL_COMMUNITY): Payer: Medicaid Other

## 2011-06-08 LAB — CARDIAC PANEL(CRET KIN+CKTOT+MB+TROPI)
CK, MB: 3.6 ng/mL (ref 0.3–4.0)
CK, MB: 3.7 ng/mL (ref 0.3–4.0)
CK, MB: 4 ng/mL (ref 0.3–4.0)
Relative Index: INVALID (ref 0.0–2.5)
Relative Index: INVALID (ref 0.0–2.5)
Relative Index: INVALID (ref 0.0–2.5)
Total CK: 76 U/L (ref 7–177)
Total CK: 59 U/L (ref 7–177)
Total CK: 61 U/L (ref 7–177)
Troponin I: <0.3 ng/mL (ref <0.30)
Troponin I: <0.3 ng/mL (ref <0.30)
Troponin I: <0.3 ng/mL (ref <0.30)

## 2011-06-08 LAB — GLUCOSE, CAPILLARY
Glucose-Capillary: 211 mg/dL — ABNORMAL HIGH (ref 70–99)
Glucose-Capillary: 90 mg/dL (ref 70–99)
Glucose-Capillary: 171 mg/dL — ABNORMAL HIGH (ref 70–99)
Glucose-Capillary: 97 mg/dL (ref 70–99)

## 2011-06-08 LAB — CBC
HCT: 42.3 % (ref 36.0–46.0)
Hemoglobin: 14 g/dL (ref 12.0–15.0)
MCH: 29.8 pg (ref 26.0–34.0)
MCHC: 33.1 g/dL (ref 30.0–36.0)
MCV: 90 fL (ref 78.0–100.0)
Platelets: 192 10*3/uL (ref 150–400)
RBC: 4.7 MIL/uL (ref 3.87–5.11)
RDW: 14.1 % (ref 11.5–15.5)
WBC: 10.2 10*3/uL (ref 4.0–10.5)

## 2011-06-08 LAB — BASIC METABOLIC PANEL
BUN: 14 mg/dL (ref 6–23)
CO2: 27 mEq/L (ref 19–32)
Calcium: 10 mg/dL (ref 8.4–10.5)
Chloride: 97 mEq/L (ref 96–112)
Creatinine, Ser: 0.79 mg/dL (ref 0.50–1.10)
GFR calc Af Amer: >90 mL/min (ref >90)
GFR calc non Af Amer: >90 mL/min (ref >90)
Glucose, Bld: 214 mg/dL — ABNORMAL HIGH (ref 70–99)
Potassium: 3.1 mEq/L — ABNORMAL LOW (ref 3.5–5.1)
Sodium: 134 mEq/L — ABNORMAL LOW (ref 135–145)

## 2011-06-08 LAB — PRO B NATRIURETIC PEPTIDE: Pro B Natriuretic peptide (BNP): 1674 pg/mL — ABNORMAL HIGH (ref 0–125)

## 2011-06-08 IMAGING — CR DG CHEST 2V
2 series · 2 of 2 positions shown · non-contrast
Comparison: [DATE]

CLINICAL DATA: Cough, congestion and shortness of breath.

CHEST - 2 VIEW

[view not recorded (1 of 2)]
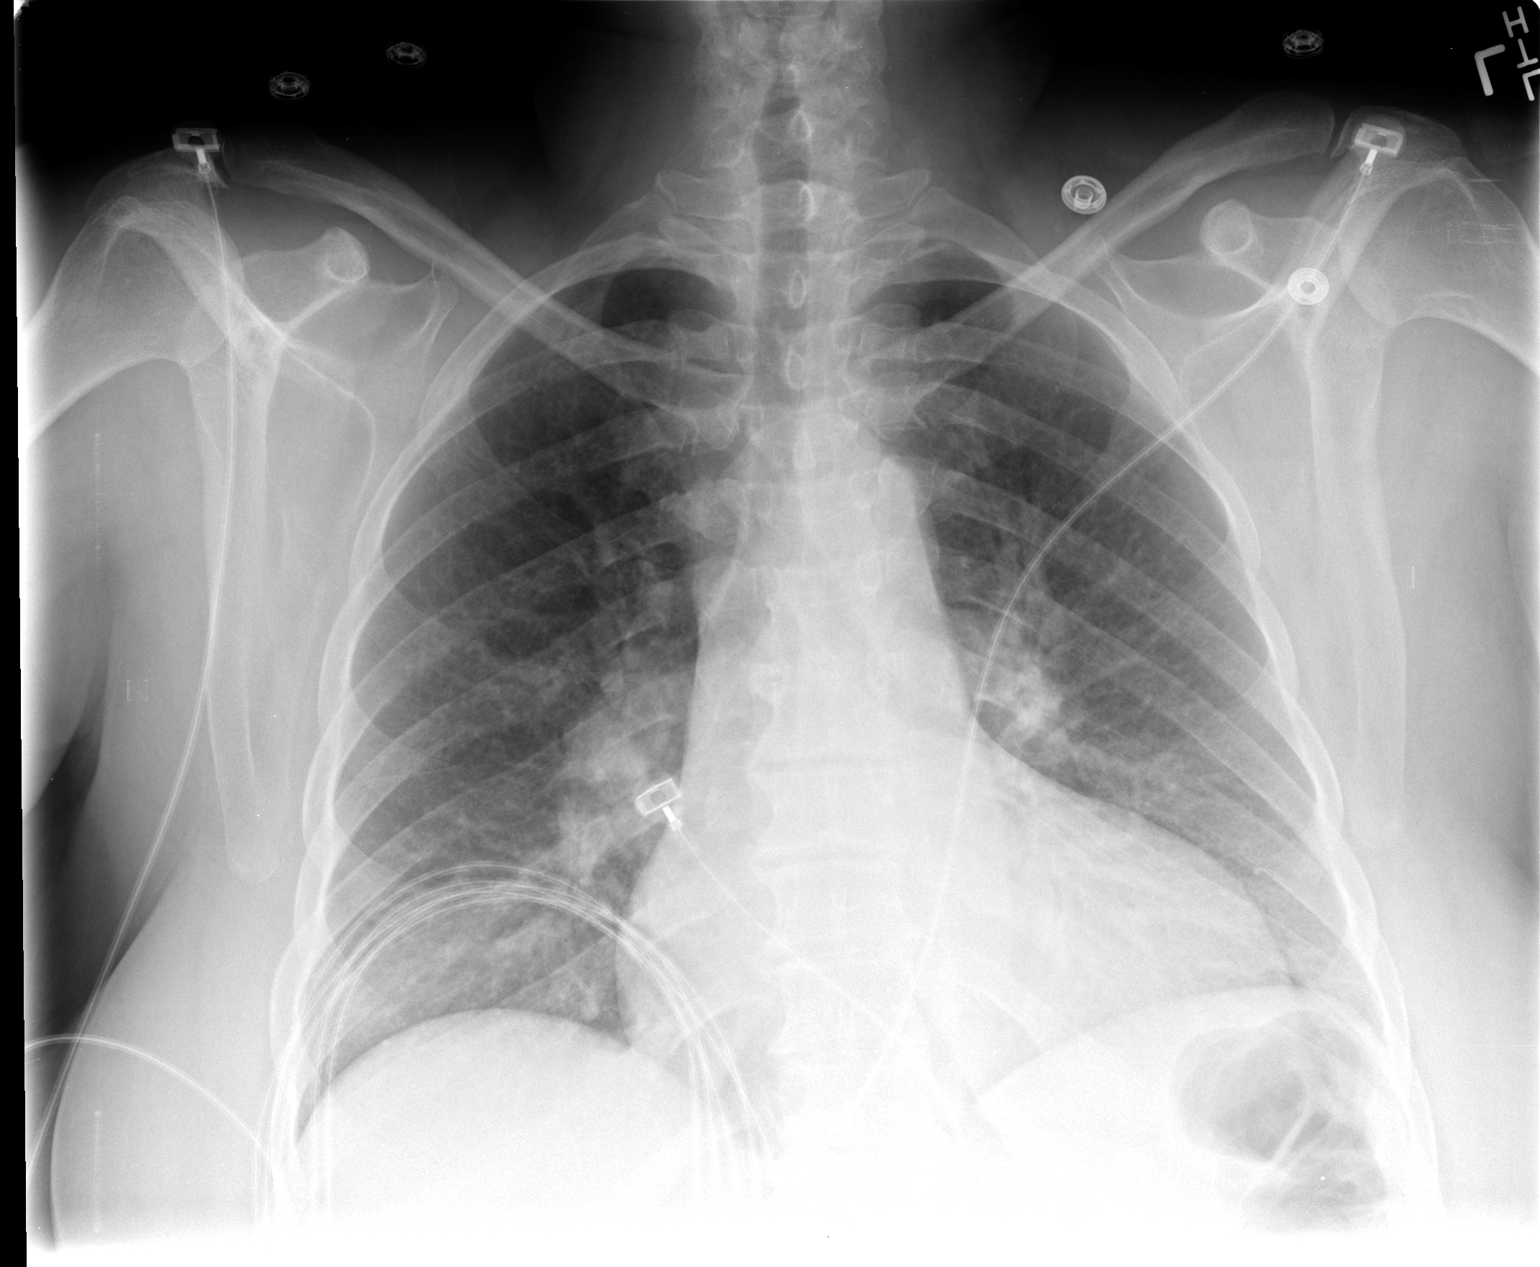

[view not recorded (2 of 2)]
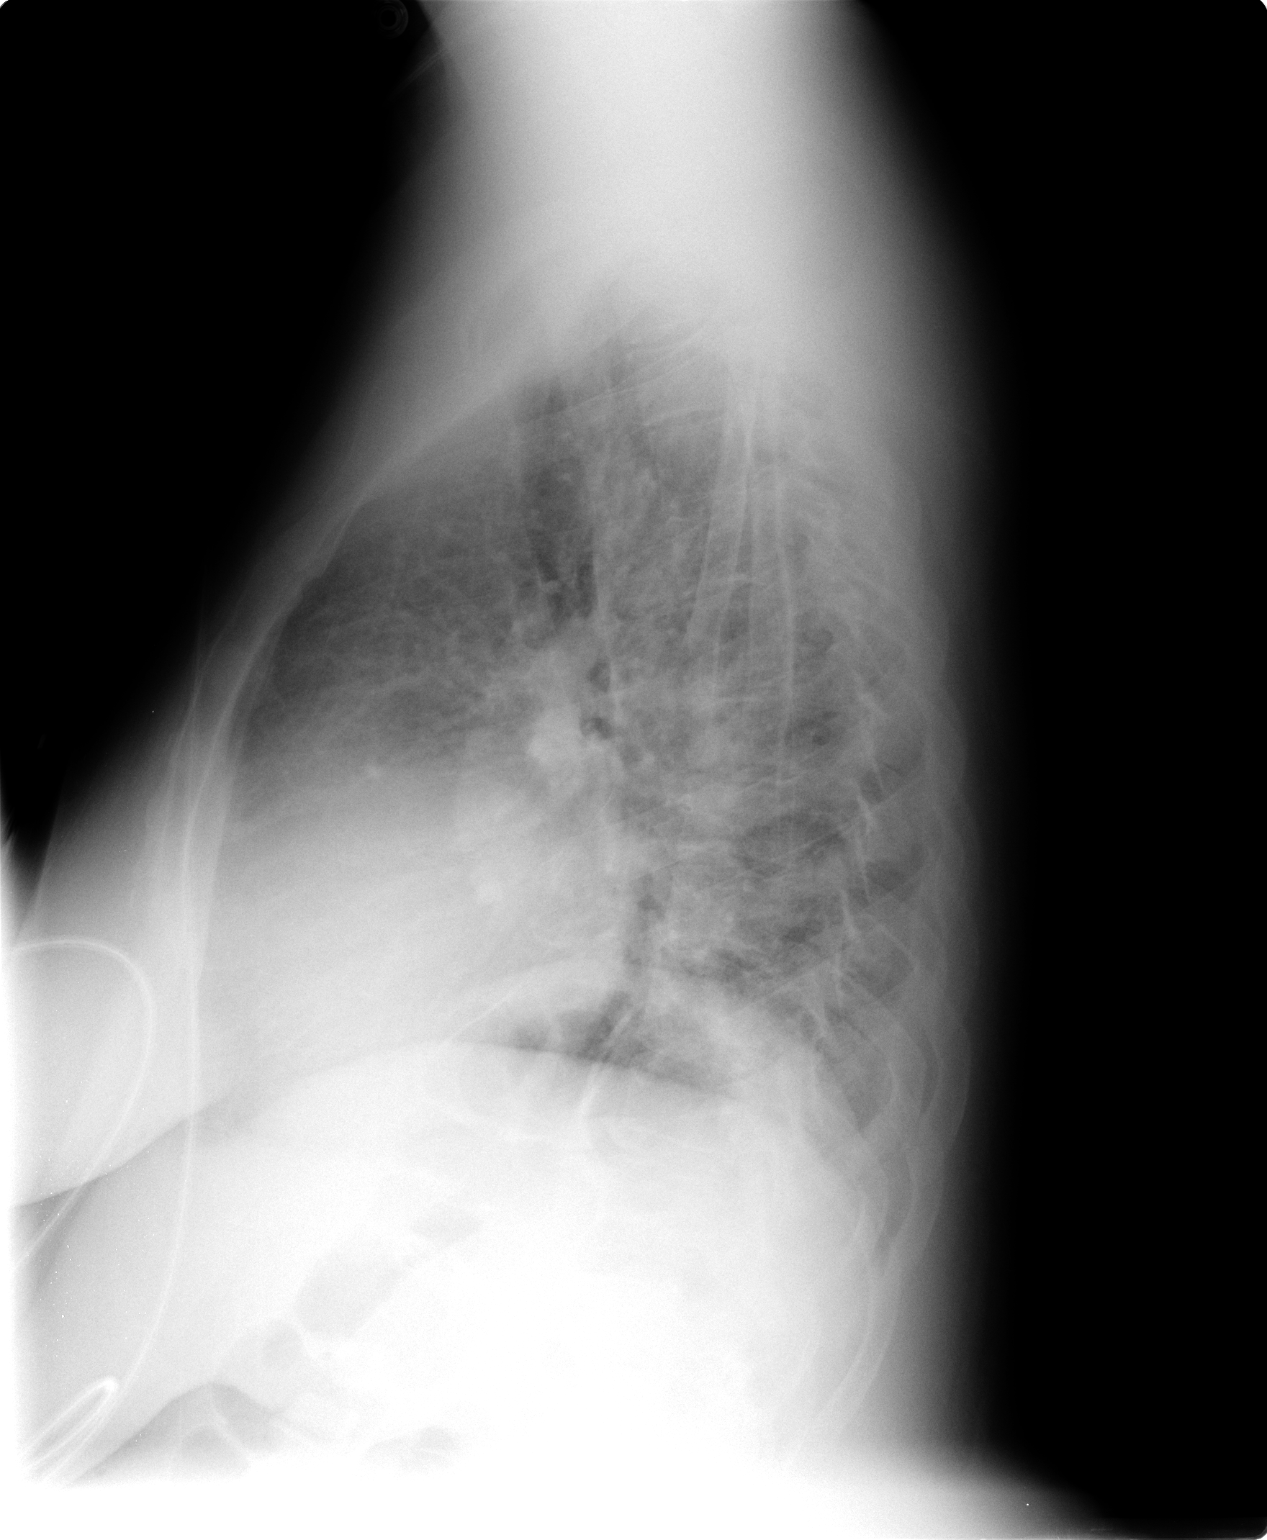

[2 of 2 positions shown; findings below may reference images not displayed]

FINDINGS: Since the prior study, the lungs are clear with
resolution of the pulmonary edema.  The heart is mildly enlarged.
No mediastinal or hilar masses or adenopathy noted.  There are no
pleural effusions.
IMPRESSION: Resolved pulmonary edema.  Lungs are now clear.

## 2011-06-08 MED ORDER — SODIUM CHLORIDE 0.9 % IJ SOLN
INTRAMUSCULAR | Status: AC
  Administered 2011-06-08: 10 mL
  Filled 2011-06-08: qty 3

## 2011-06-08 MED ORDER — METFORMIN HCL 500 MG PO TABS: 1000.0000 mg | ORAL_TABLET | Freq: Two times a day (BID) | ORAL | Status: DC

## 2011-06-08 NOTE — Progress Notes (Signed)
Subjective: This lady was admitted with what appears to be likely diastolic congestive heart failure. We will wait for  an echocardiogram report. She had not been taking her felodipine for the last 4 months and no doubt her blood pressure was uncontrolled when she was admitted. Now it is much better controlled and she feels improved overnight.           Physical Exam: Blood pressure 142/76, pulse 87, temperature 97.9 F (36.6 C), temperature source Oral, resp. rate 20, height 5\' 6"  (1.676 m), weight 90.1 kg (198 lb 10.2 oz), last menstrual period 05/30/2011, SpO2 100.00%. She looks systemically well. She does not increased work of breathing. Heart sounds are present and normal without gallop rhythm. Jugular venous pressure not raised. Lung fields are essentially clear with a few crackles especially when she has coughing episodes. She is alert and orientated without any focal neurological signs.   Investigations: Results for orders placed during the hospital encounter of 06/07/11 (from the past 48 hour(s))  CBC     Status: Normal   Collection Time   06/07/11  7:57 AM      Component Value Range Comment   WBC 8.2  4.0 - 10.5 (K/uL)    RBC 4.53  3.87 - 5.11 (MIL/uL)    Hemoglobin 13.8  12.0 - 15.0 (g/dL)    HCT 91.4  78.2 - 95.6 (%)    MCV 88.5  78.0 - 100.0 (fL)    MCH 30.5  26.0 - 34.0 (pg)    MCHC 34.4  30.0 - 36.0 (g/dL)    RDW 21.3  08.6 - 57.8 (%)    Platelets 192  150 - 400 (K/uL)   DIFFERENTIAL     Status: Normal   Collection Time   06/07/11  7:57 AM      Component Value Range Comment   Neutrophils Relative 61  43 - 77 (%)    Neutro Abs 5.0  1.7 - 7.7 (K/uL)    Lymphocytes Relative 29  12 - 46 (%)    Lymphs Abs 2.4  0.7 - 4.0 (K/uL)    Monocytes Relative 8  3 - 12 (%)    Monocytes Absolute 0.7  0.1 - 1.0 (K/uL)    Eosinophils Relative 2  0 - 5 (%)    Eosinophils Absolute 0.1  0.0 - 0.7 (K/uL)    Basophils Relative 1  0 - 1 (%)    Basophils Absolute 0.0  0.0 - 0.1  (K/uL)   COMPREHENSIVE METABOLIC PANEL     Status: Abnormal   Collection Time   06/07/11  7:57 AM      Component Value Range Comment   Sodium 136  135 - 145 (mEq/L)    Potassium 3.5  3.5 - 5.1 (mEq/L)    Chloride 102  96 - 112 (mEq/L)    CO2 22  19 - 32 (mEq/L)    Glucose, Bld 136 (*) 70 - 99 (mg/dL)    BUN 10  6 - 23 (mg/dL)    Creatinine, Ser 4.69  0.50 - 1.10 (mg/dL)    Calcium 8.6  8.4 - 10.5 (mg/dL)    Total Protein 6.7  6.0 - 8.3 (g/dL)    Albumin 3.2 (*) 3.5 - 5.2 (g/dL)    AST 15  0 - 37 (U/L)    ALT 11  0 - 35 (U/L)    Alkaline Phosphatase 92  39 - 117 (U/L)    Total Bilirubin 0.6  0.3 - 1.2 (mg/dL)  GFR calc non Af Amer >90  >90 (mL/min)    GFR calc Af Amer >90  >90 (mL/min)   CARDIAC PANEL(CRET KIN+CKTOT+MB+TROPI)     Status: Abnormal   Collection Time   06/07/11  7:57 AM      Component Value Range Comment   Total CK 120  7 - 177 (U/L)    CK, MB 4.3 (*) 0.3 - 4.0 (ng/mL)    Troponin I <0.30  <0.30 (ng/mL)    Relative Index 3.6 (*) 0.0 - 2.5    PRO B NATRIURETIC PEPTIDE     Status: Abnormal   Collection Time   06/07/11  7:57 AM      Component Value Range Comment   BNP, POC 1861.0 (*) 0 - 125 (pg/mL)   MRSA PCR SCREENING     Status: Normal   Collection Time   06/07/11  5:30 PM      Component Value Range Comment   MRSA by PCR NEGATIVE  NEGATIVE    GLUCOSE, CAPILLARY     Status: Abnormal   Collection Time   06/07/11  6:41 PM      Component Value Range Comment   Glucose-Capillary 228 (*) 70 - 99 (mg/dL)   GLUCOSE, CAPILLARY     Status: Abnormal   Collection Time   06/07/11  9:33 PM      Component Value Range Comment   Glucose-Capillary 308 (*) 70 - 99 (mg/dL)    Comment 1 Notify RN     CARDIAC PANEL(CRET KIN+CKTOT+MB+TROPI)     Status: Normal   Collection Time   06/08/11 12:29 AM      Component Value Range Comment   Total CK 76  7 - 177 (U/L)    CK, MB 3.6  0.3 - 4.0 (ng/mL)    Troponin I <0.30  <0.30 (ng/mL)    Relative Index RELATIVE INDEX IS INVALID  0.0 -  2.5    PRO B NATRIURETIC PEPTIDE     Status: Abnormal   Collection Time   06/08/11  5:09 AM      Component Value Range Comment   BNP, POC 1674.0 (*) 0 - 125 (pg/mL)   BASIC METABOLIC PANEL     Status: Abnormal   Collection Time   06/08/11  5:09 AM      Component Value Range Comment   Sodium 134 (*) 135 - 145 (mEq/L)    Potassium 3.1 (*) 3.5 - 5.1 (mEq/L)    Chloride 97  96 - 112 (mEq/L)    CO2 27  19 - 32 (mEq/L)    Glucose, Bld 214 (*) 70 - 99 (mg/dL)    BUN 14  6 - 23 (mg/dL)    Creatinine, Ser 6.21  0.50 - 1.10 (mg/dL)    Calcium 30.8  8.4 - 10.5 (mg/dL)    GFR calc non Af Amer >90  >90 (mL/min)    GFR calc Af Amer >90  >90 (mL/min)   CBC     Status: Normal   Collection Time   06/08/11  5:09 AM      Component Value Range Comment   WBC 10.2  4.0 - 10.5 (K/uL)    RBC 4.70  3.87 - 5.11 (MIL/uL)    Hemoglobin 14.0  12.0 - 15.0 (g/dL)    HCT 65.7  84.6 - 96.2 (%)    MCV 90.0  78.0 - 100.0 (fL)    MCH 29.8  26.0 - 34.0 (pg)    MCHC 33.1  30.0 -  36.0 (g/dL)    RDW 16.1  09.6 - 04.5 (%)    Platelets 192  150 - 400 (K/uL)   GLUCOSE, CAPILLARY     Status: Abnormal   Collection Time   06/08/11  7:34 AM      Component Value Range Comment   Glucose-Capillary 211 (*) 70 - 99 (mg/dL)    Comment 1 Notify RN      Comment 2 Documented in Chart      Recent Results (from the past 240 hour(s))  MRSA PCR SCREENING     Status: Normal   Collection Time   06/07/11  5:30 PM      Component Value Range Status Comment   MRSA by PCR NEGATIVE  NEGATIVE  Final     Dg Chest Portable 1 View  06/07/2011  *RADIOLOGY REPORT*  Clinical Data: Shortness of breath.  Chest pain.  PORTABLE CHEST - 1 VIEW 06/07/2011 0746 hours:  Comparison: Two-view chest x-ray 02/09/2011, 11/07/2010, and 06/18/2010 [facility].  Findings: Cardiac silhouette markedly enlarged but stable. Interval development of mild diffuse interstitial pulmonary edema. No localized airspace consolidation.  No visible pleural effusions.   IMPRESSION: Mild CHF with stable marked cardiomegaly and mild diffuse interstitial pulmonary edema.  Original Report Authenticated By: Arnell Sieving, M.D.      Medications: I have reviewed the patient's current medications.  Impression: 1. Congestive heart failure, likely diastolic, improved. 2. Uncontrolled hypertension, improved. 3. Type 2 diabetes mellitus. 4. Tobacco abuse. 5. Obesity and likely metabolic syndrome.     Plan:  1. Continue with current medications. 2. Repeat chest x-ray. 3. Move to telemetry floor. 4. Possible discharge home tomorrow.      LOS: 1 day   Kunaal Walkins C 06/08/2011, 8:51 AM

## 2011-06-08 NOTE — Progress Notes (Signed)
Pt to be transferred to room 305 per MD order. Report called to RN. Pt to be transferred via wheelchair.

## 2011-06-09 LAB — COMPREHENSIVE METABOLIC PANEL
ALT: 9 U/L (ref 0–35)
AST: 12 U/L (ref 0–37)
Albumin: 3.3 g/dL — ABNORMAL LOW (ref 3.5–5.2)
Alkaline Phosphatase: 88 U/L (ref 39–117)
BUN: 17 mg/dL (ref 6–23)
CO2: 29 mEq/L (ref 19–32)
Calcium: 9.4 mg/dL (ref 8.4–10.5)
Chloride: 99 mEq/L (ref 96–112)
Creatinine, Ser: 0.92 mg/dL (ref 0.50–1.10)
GFR calc Af Amer: 87 mL/min — ABNORMAL LOW (ref >90)
GFR calc non Af Amer: 75 mL/min — ABNORMAL LOW (ref >90)
Glucose, Bld: 96 mg/dL (ref 70–99)
Potassium: 3.1 mEq/L — ABNORMAL LOW (ref 3.5–5.1)
Sodium: 136 mEq/L (ref 135–145)
Total Bilirubin: 0.4 mg/dL (ref 0.3–1.2)
Total Protein: 6.4 g/dL (ref 6.0–8.3)

## 2011-06-09 LAB — CBC
HCT: 45.3 % (ref 36.0–46.0)
Hemoglobin: 15.1 g/dL — ABNORMAL HIGH (ref 12.0–15.0)
MCH: 30.1 pg (ref 26.0–34.0)
MCHC: 33.3 g/dL (ref 30.0–36.0)
MCV: 90.2 fL (ref 78.0–100.0)
Platelets: 213 10*3/uL (ref 150–400)
RBC: 5.02 MIL/uL (ref 3.87–5.11)
RDW: 14.2 % (ref 11.5–15.5)
WBC: 8 10*3/uL (ref 4.0–10.5)

## 2011-06-09 LAB — LIPID PANEL
Cholesterol: 124 mg/dL (ref 0–200)
HDL: 47 mg/dL (ref >39)
LDL Cholesterol: 40 mg/dL (ref 0–99)
Total CHOL/HDL Ratio: 2.6 RATIO
Triglycerides: 185 mg/dL — ABNORMAL HIGH (ref <150)
VLDL: 37 mg/dL (ref 0–40)

## 2011-06-09 LAB — GLUCOSE, CAPILLARY: Glucose-Capillary: 133 mg/dL — ABNORMAL HIGH (ref 70–99)

## 2011-06-09 MED ORDER — ENALAPRIL MALEATE 5 MG PO TABS
20.0000 mg | ORAL_TABLET | Freq: Every day | ORAL | Status: DC
  Administered 2011-06-09: 20 mg via ORAL
  Filled 2011-06-09: qty 4

## 2011-06-09 MED ORDER — POTASSIUM CHLORIDE CRYS ER 20 MEQ PO TBCR
40.0000 meq | EXTENDED_RELEASE_TABLET | Freq: Once | ORAL | Status: AC
  Administered 2011-06-09: 40 meq via ORAL
  Filled 2011-06-09: qty 2

## 2011-06-09 MED ORDER — ENALAPRIL MALEATE 20 MG PO TABS: 20.0000 mg | ORAL_TABLET | Freq: Every day | ORAL | Status: DC

## 2011-06-09 NOTE — Discharge Summary (Signed)
Physician Discharge Summary  Patient ID: Robin [name] MRN: [id number] DOB/AGE: [birth year] 45 y.o. Primary Care Physician:HALM,STEVEN J, MD Admit date: 06/07/2011 Discharge date: 06/09/2011    Discharge Diagnoses:  1. Congestive heart failure, acute, probably diastolic. 2. Uncontrolled hypertension. 3. Diabetes mellitus type 2. 4. COPD. 5. Obesity.   Current Discharge Medication List    START taking these medications   Details  enalapril (VASOTEC) 20 MG tablet Take 1 tablet (20 mg total) by mouth daily. Qty: 30 tablet, Refills: 0      CONTINUE these medications which have NOT CHANGED   Details  albuterol-ipratropium (COMBIVENT) 18-103 MCG/ACT inhaler Inhale 2 puffs into the lungs every 4 (four) hours as needed.      felodipine (PLENDIL) 10 MG 24 hr tablet Take 10 mg by mouth daily.      glimepiride (AMARYL) 4 MG tablet Take 4 mg by mouth daily before breakfast.      hydrochlorothiazide (HYDRODIURIL) 25 MG tablet Take 25 mg by mouth daily.      HYDROcodone-acetaminophen (VICODIN ES) 7.5-750 MG per tablet Take 1 tablet by mouth every 6 (six) hours as needed.      metFORMIN (GLUCOPHAGE) 1000 MG tablet Take 1,000 mg by mouth 2 (two) times daily with a meal.      omeprazole (PRILOSEC) 20 MG capsule Take 20 mg by mouth daily.      albuterol (PROVENTIL) (2.5 MG/3ML) 0.083% nebulizer solution Take 2.5 mg by nebulization every 6 (six) hours as needed.          Discharged Condition: Improved and stable.    Consults: None.  Significant Diagnostic Studies: Dg Chest 2 View  06/08/2011  *RADIOLOGY REPORT*  Clinical Data: Cough, congestion and shortness of breath.  CHEST - 2 VIEW  Comparison: 06/07/2011  Findings: Since the prior study, the lungs are clear with resolution of the pulmonary edema.  The heart is mildly enlarged. No mediastinal or hilar masses or adenopathy noted.  There are no pleural effusions.  IMPRESSION: Resolved pulmonary edema.  Lungs are now clear.   Original Report Authenticated By:    Dg Chest Portable 1 View  06/07/2011  *RADIOLOGY REPORT*  Clinical Data: Shortness of breath.  Chest pain.  PORTABLE CHEST - 1 VIEW 06/07/2011 0746 hours:  Comparison: Two-view chest x-ray 02/09/2011, 11/07/2010, and 06/18/2010 [facility].  Findings: Cardiac silhouette markedly enlarged but stable. Interval development of mild diffuse interstitial pulmonary edema. No localized airspace consolidation.  No visible pleural effusions.  IMPRESSION: Mild CHF with stable marked cardiomegaly and mild diffuse interstitial pulmonary edema.  Original Report Authenticated By: Arnell Sieving, M.D.    Lab Results: Results for orders placed during the hospital encounter of 06/07/11 (from the past 48 hour(s))  MRSA PCR SCREENING     Status: Normal   Collection Time   06/07/11  5:30 PM      Component Value Range Comment   MRSA by PCR NEGATIVE  NEGATIVE    GLUCOSE, CAPILLARY     Status: Abnormal   Collection Time   06/07/11  6:41 PM      Component Value Range Comment   Glucose-Capillary 228 (*) 70 - 99 (mg/dL)   GLUCOSE, CAPILLARY     Status: Abnormal   Collection Time   06/07/11  9:33 PM      Component Value Range Comment   Glucose-Capillary 308 (*) 70 - 99 (mg/dL)    Comment 1 Notify RN     CARDIAC PANEL(CRET KIN+CKTOT+MB+TROPI)  Status: Normal   Collection Time   06/08/11 12:29 AM      Component Value Range Comment   Total CK 76  7 - 177 (U/L)    CK, MB 3.6  0.3 - 4.0 (ng/mL)    Troponin I <0.30  <0.30 (ng/mL)    Relative Index RELATIVE INDEX IS INVALID  0.0 - 2.5    PRO B NATRIURETIC PEPTIDE     Status: Abnormal   Collection Time   06/08/11  5:09 AM      Component Value Range Comment   BNP, POC 1674.0 (*) 0 - 125 (pg/mL)   BASIC METABOLIC PANEL     Status: Abnormal   Collection Time   06/08/11  5:09 AM      Component Value Range Comment   Sodium 134 (*) 135 - 145 (mEq/L)    Potassium 3.1 (*) 3.5 - 5.1 (mEq/L)    Chloride 97  96 - 112 (mEq/L)     CO2 27  19 - 32 (mEq/L)    Glucose, Bld 214 (*) 70 - 99 (mg/dL)    BUN 14  6 - 23 (mg/dL)    Creatinine, Ser 1.61  0.50 - 1.10 (mg/dL)    Calcium 09.6  8.4 - 10.5 (mg/dL)    GFR calc non Af Amer >90  >90 (mL/min)    GFR calc Af Amer >90  >90 (mL/min)   CBC     Status: Normal   Collection Time   06/08/11  5:09 AM      Component Value Range Comment   WBC 10.2  4.0 - 10.5 (K/uL)    RBC 4.70  3.87 - 5.11 (MIL/uL)    Hemoglobin 14.0  12.0 - 15.0 (g/dL)    HCT 04.5  40.9 - 81.1 (%)    MCV 90.0  78.0 - 100.0 (fL)    MCH 29.8  26.0 - 34.0 (pg)    MCHC 33.1  30.0 - 36.0 (g/dL)    RDW 91.4  78.2 - 95.6 (%)    Platelets 192  150 - 400 (K/uL)   GLUCOSE, CAPILLARY     Status: Abnormal   Collection Time   06/08/11  7:34 AM      Component Value Range Comment   Glucose-Capillary 211 (*) 70 - 99 (mg/dL)    Comment 1 Notify RN      Comment 2 Documented in Chart     CARDIAC PANEL(CRET KIN+CKTOT+MB+TROPI)     Status: Normal   Collection Time   06/08/11  7:52 AM      Component Value Range Comment   Total CK 59  7 - 177 (U/L)    CK, MB 3.7  0.3 - 4.0 (ng/mL)    Troponin I <0.30  <0.30 (ng/mL)    Relative Index RELATIVE INDEX IS INVALID  0.0 - 2.5    GLUCOSE, CAPILLARY     Status: Normal   Collection Time   06/08/11 11:32 AM      Component Value Range Comment   Glucose-Capillary 90  70 - 99 (mg/dL)    Comment 1 Notify RN      Comment 2 Documented in Chart     CARDIAC PANEL(CRET KIN+CKTOT+MB+TROPI)     Status: Normal   Collection Time   06/08/11  3:13 PM      Component Value Range Comment   Total CK 61  7 - 177 (U/L)    CK, MB 4.0  0.3 - 4.0 (ng/mL)    Troponin I <  0.30  <0.30 (ng/mL)    Relative Index RELATIVE INDEX IS INVALID  0.0 - 2.5    GLUCOSE, CAPILLARY     Status: Abnormal   Collection Time   06/08/11  4:47 PM      Component Value Range Comment   Glucose-Capillary 171 (*) 70 - 99 (mg/dL)   GLUCOSE, CAPILLARY     Status: Normal   Collection Time   06/08/11  8:53 PM      Component  Value Range Comment   Glucose-Capillary 97  70 - 99 (mg/dL)    Comment 1 Notify RN     CBC     Status: Abnormal   Collection Time   06/09/11  6:46 AM      Component Value Range Comment   WBC 8.0  4.0 - 10.5 (K/uL)    RBC 5.02  3.87 - 5.11 (MIL/uL)    Hemoglobin 15.1 (*) 12.0 - 15.0 (g/dL)    HCT 81.1  91.4 - 78.2 (%)    MCV 90.2  78.0 - 100.0 (fL)    MCH 30.1  26.0 - 34.0 (pg)    MCHC 33.3  30.0 - 36.0 (g/dL)    RDW 95.6  21.3 - 08.6 (%)    Platelets 213  150 - 400 (K/uL)   COMPREHENSIVE METABOLIC PANEL     Status: Abnormal   Collection Time   06/09/11  6:46 AM      Component Value Range Comment   Sodium 136  135 - 145 (mEq/L)    Potassium 3.1 (*) 3.5 - 5.1 (mEq/L)    Chloride 99  96 - 112 (mEq/L)    CO2 29  19 - 32 (mEq/L)    Glucose, Bld 96  70 - 99 (mg/dL)    BUN 17  6 - 23 (mg/dL)    Creatinine, Ser 5.78  0.50 - 1.10 (mg/dL)    Calcium 9.4  8.4 - 10.5 (mg/dL)    Total Protein 6.4  6.0 - 8.3 (g/dL)    Albumin 3.3 (*) 3.5 - 5.2 (g/dL)    AST 12  0 - 37 (U/L)    ALT 9  0 - 35 (U/L)    Alkaline Phosphatase 88  39 - 117 (U/L)    Total Bilirubin 0.4  0.3 - 1.2 (mg/dL)    GFR calc non Af Amer 75 (*) >90 (mL/min)    GFR calc Af Amer 87 (*) >90 (mL/min)   LIPID PANEL     Status: Abnormal   Collection Time   06/09/11  6:46 AM      Component Value Range Comment   Cholesterol 124  0 - 200 (mg/dL)    Triglycerides 469 (*) <150 (mg/dL)    HDL 47  >62 (mg/dL)    Total CHOL/HDL Ratio 2.6      VLDL 37  0 - 40 (mg/dL)    LDL Cholesterol 40  0 - 99 (mg/dL)    Recent Results (from the past 240 hour(s))  MRSA PCR SCREENING     Status: Normal   Collection Time   06/07/11  5:30 PM      Component Value Range Status Comment   MRSA by PCR NEGATIVE  NEGATIVE  Final      Hospital Course: This 45 year old lady was admitted with dyspnea. Please see initial history and physical examination. She was felt to be in congestive heart failure. She noted that she had not been taking one of her  antihypertensive medications for the last 4  months and when she presented to the emergency room she had extremely high blood pressure reading at 198/100. She was therefore restarted on her medications and diuresed with furosemide. She did well and improved quickly. A chest x-ray also improved and showed resolution of her pulmonary edema. Serial cardiac enzymes did not indicate any evidence of cardiac ischemia or infarction. Today she is feeling well, without dyspnea and wants to go home.  Discharge Exam: Blood pressure 166/100, pulse 75, temperature 98.2 F (36.8 C), temperature source Oral, resp. rate 20, height 5\' 6"  (1.676 m), weight 90.1 kg (198 lb 10.2 oz), last menstrual period 05/30/2011, SpO2 100.00%. She looks systemically well. She is not in any respiratory distress. Heart sounds are present and normal. Lung fields are entirely clear. Jugular venous pressure not raised. She is alert and orientated without any focal neurological signs.  Disposition: Home. I've increased and added an ACE inhibitor  her antihypertensive regimen. She will likely require an echocardiogram as an outpatient and possibly referral to cardiology. In the meantime, I've stressed upon her the importance of taking her antihypertensive medication, weight loss, exercise and following up with her primary care physician on a regular basis. Her primary care physician is Dr. Vivia Ewing and she will followup with him in the next week or so.  Discharge Orders    Future Orders Please Complete By Expires   Diet - low sodium heart healthy      Increase activity slowly           Signed: Tylique Aull C 06/09/2011, 9:04 AM

## 2011-06-12 NOTE — Progress Notes (Signed)
Utilization review completed.  

## 2011-06-17 ENCOUNTER — Other Ambulatory Visit (HOSPITAL_COMMUNITY): Payer: Self-pay | Admitting: Pediatrics

## 2011-06-17 DIAGNOSIS — Z139 Encounter for screening, unspecified: Secondary | ICD-10-CM

## 2011-06-20 ENCOUNTER — Ambulatory Visit: Payer: Medicaid Other | Admitting: Cardiology

## 2011-06-25 ENCOUNTER — Ambulatory Visit (HOSPITAL_COMMUNITY): Payer: Medicaid Other

## 2011-07-01 ENCOUNTER — Ambulatory Visit (HOSPITAL_COMMUNITY): Payer: Medicaid Other

## 2011-07-02 ENCOUNTER — Encounter: Payer: Self-pay | Admitting: Cardiology

## 2011-07-03 ENCOUNTER — Ambulatory Visit (INDEPENDENT_AMBULATORY_CARE_PROVIDER_SITE_OTHER): Payer: Medicaid Other | Admitting: Cardiology

## 2011-07-03 ENCOUNTER — Encounter: Payer: Self-pay | Admitting: Cardiology

## 2011-07-03 DIAGNOSIS — J449 Chronic obstructive pulmonary disease, unspecified: Secondary | ICD-10-CM

## 2011-07-03 DIAGNOSIS — R109 Unspecified abdominal pain: Secondary | ICD-10-CM | POA: Insufficient documentation

## 2011-07-03 DIAGNOSIS — I5032 Chronic diastolic (congestive) heart failure: Secondary | ICD-10-CM

## 2011-07-03 DIAGNOSIS — I509 Heart failure, unspecified: Secondary | ICD-10-CM

## 2011-07-03 DIAGNOSIS — I10 Essential (primary) hypertension: Secondary | ICD-10-CM

## 2011-07-03 DIAGNOSIS — I161 Hypertensive emergency: Secondary | ICD-10-CM

## 2011-07-03 NOTE — Progress Notes (Signed)
HPI: Robin [name] is a 45 y/o patient of Dr. Daleen Squibb who is lost to follow-up with history of hypertension, diabetes, GERD and diastolic CHF. She was recently hospitalized for same in November of 2012.  She had been seen by Dr.Wall approx 4 years ago and had cardiac catheterization. This proved to be normal. She has been medically noncompliant for 3 years as she has had family issues to include the deaths of her aunt and uncle who raised her.  She was placed on HCTZ and Enalapril for BP control and was asked to follow-up with cardiology.   She comes today with some complaints of DOE, she has gained 5 lbs since discharge from hospital. She also has complaints of abdominal discomfort and swelling at times. She is medically compliant and not always compliant with dietary salt restrictions.  Allergies  Allergen Reactions  . Bee Venom Swelling    HAS HAD AN EPI PEN IN THE PAST    Current Outpatient Prescriptions  Medication Sig Dispense Refill  . albuterol (PROVENTIL) (2.5 MG/3ML) 0.083% nebulizer solution Take 2.5 mg by nebulization every 6 (six) hours as needed.        Marland Kitchen albuterol-ipratropium (COMBIVENT) 18-103 MCG/ACT inhaler Inhale 2 puffs into the lungs every 4 (four) hours as needed.        . cholecalciferol (VITAMIN D) 1000 UNITS tablet Take 1,000 Units by mouth daily.        . enalapril (VASOTEC) 20 MG tablet Take 1 tablet (20 mg total) by mouth daily.  30 tablet  0  . felodipine (PLENDIL) 10 MG 24 hr tablet Take 10 mg by mouth daily.        Marland Kitchen glimepiride (AMARYL) 4 MG tablet Take 4 mg by mouth daily before breakfast.        . hydrochlorothiazide (HYDRODIURIL) 25 MG tablet Take 25 mg by mouth daily.        Marland Kitchen HYDROcodone-acetaminophen (VICODIN ES) 7.5-750 MG per tablet Take 1 tablet by mouth every 6 (six) hours as needed.        . metFORMIN (GLUCOPHAGE) 1000 MG tablet Take 1,000 mg by mouth 2 (two) times daily with a meal.        . omeprazole (PRILOSEC) 20 MG capsule Take 20 mg by mouth daily.           Past Medical History  Diagnosis Date  . Type 2 diabetes mellitus   . Essential hypertension, benign   . GERD (gastroesophageal reflux disease)   . COPD (chronic obstructive pulmonary disease)   . Arthritis   . Normal coronary arteries     3/10 - following abnormal Myoview    Past Surgical History  Procedure Date  . Tubal ligation   . Cholecystectomy     Family History  Problem Relation Age of Onset  . Cirrhosis Mother   . Diabetes type II Father   . Diabetes type II Sister     History   Social History  . Marital Status: Married    Spouse Name: N/A    Number of Children: N/A  . Years of Education: N/A   Occupational History  . Not on file.   Social History Main Topics  . Smoking status: Current Everyday Smoker -- 0.5 packs/day for 5 years    Types: Cigarettes  . Smokeless tobacco: Never Used  . Alcohol Use: 1.2 oz/week    2 Cans of beer per week     Weekends  . Drug Use: No  . Sexually  Active: Yes   Other Topics Concern  . Not on file   Social History Narrative  . No narrative on file    RUE:AVWUJW of systems complete and found to be negative unless listed above  PHYSICAL EXAM BP 127/72  Pulse 90  Ht 5\' 6"  (1.676 m)  Wt 204 lb (92.534 kg)  BMI 32.93 kg/m2  SpO2 98%  LMP 05/30/2011  General: Well developed, well nourished, in no acute distress Head: Eyes PERRLA, No xanthomas.   Normal cephalic and atramatic  Lungs: Bilateral crackles are noted in the bases, no wheezes. Heart: HRRR S1 S2, Systolic murmur at the apex..  Pulses are 2+ & equal.            No carotid bruit. No JVD.  No abdominal bruits. No femoral bruits. Abdomen: Bowel sounds are positive, abdomen soft with tenderness in the right and left upper quadrants with palpation.  No hernia's noted. Msk:  Back normal, normal gait. Normal strength and tone for age. Extremities: No clubbing, cyanosis or edema.  DP +1 Neuro: Alert and oriented X 3. Psych:  Good affect, responds  appropriately  EKG:NSR rate of 91 bpm.  T-wave flattening in the infero/lateral leads.  ASSESSMENT AND PLAN

## 2011-07-03 NOTE — Assessment & Plan Note (Signed)
I will order a CXR as she has bibasilar crackles to rule out CHF.  She is to continue current inhaler treatments.

## 2011-07-03 NOTE — Assessment & Plan Note (Signed)
BP is much better controlled that recorded in the hospital under current medication regimen.  She will be scheduled for echocardiogram as she most likely will have some degree of diastolic dysfx.  I will make no changes in her medications until echo is completed. She is advised on a low sodium diet and to weigh herself daily.

## 2011-07-03 NOTE — Patient Instructions (Signed)
Your physician has requested that you have an echocardiogram. Echocardiography is a painless test that uses sound waves to create images of your heart. It provides your doctor with information about the size and shape of your heart and how well your heart's chambers and valves are working. This procedure takes approximately one hour. There are no restrictions for this procedure.  A chest x-ray takes a picture of the organs and structures inside the chest, including the heart, lungs, and blood vessels. This test can show several things, including, whether the heart is enlarges; whether fluid is building up in the lungs; and whether pacemaker / defibrillator leads are still in place.  Your physician recommends that you have a Abdominal Ultra Sound  Your physician recommends that you return for lab work in: today  Your physician recommends that you schedule a follow-up appointment in: 1 month

## 2011-07-09 ENCOUNTER — Other Ambulatory Visit (HOSPITAL_COMMUNITY): Payer: Medicaid Other

## 2011-07-09 ENCOUNTER — Ambulatory Visit (HOSPITAL_COMMUNITY): Payer: Medicaid Other

## 2011-07-11 ENCOUNTER — Ambulatory Visit (HOSPITAL_COMMUNITY)
Admission: RE | Admit: 2011-07-11 | Discharge: 2011-07-11 | Disposition: A | Discharge status: Home / Self Care | Payer: Medicaid Other | Source: Ambulatory Visit | Attending: Cardiology | Admitting: Cardiology

## 2011-07-11 ENCOUNTER — Ambulatory Visit (HOSPITAL_COMMUNITY)
Admission: RE | Admit: 2011-07-11 | Discharge: 2011-07-11 | Disposition: A | Discharge status: Home / Self Care | Payer: Medicaid Other | Source: Ambulatory Visit | Attending: Pediatrics | Admitting: Pediatrics

## 2011-07-11 DIAGNOSIS — R109 Unspecified abdominal pain: Secondary | ICD-10-CM | POA: Insufficient documentation

## 2011-07-11 DIAGNOSIS — J4489 Other specified chronic obstructive pulmonary disease: Secondary | ICD-10-CM | POA: Insufficient documentation

## 2011-07-11 DIAGNOSIS — K838 Other specified diseases of biliary tract: Secondary | ICD-10-CM | POA: Insufficient documentation

## 2011-07-11 DIAGNOSIS — I359 Nonrheumatic aortic valve disorder, unspecified: Secondary | ICD-10-CM

## 2011-07-11 DIAGNOSIS — I5032 Chronic diastolic (congestive) heart failure: Secondary | ICD-10-CM

## 2011-07-11 DIAGNOSIS — E119 Type 2 diabetes mellitus without complications: Secondary | ICD-10-CM | POA: Insufficient documentation

## 2011-07-11 DIAGNOSIS — I501 Left ventricular failure: Secondary | ICD-10-CM | POA: Insufficient documentation

## 2011-07-11 DIAGNOSIS — Z9889 Other specified postprocedural states: Secondary | ICD-10-CM | POA: Insufficient documentation

## 2011-07-11 DIAGNOSIS — Z1231 Encounter for screening mammogram for malignant neoplasm of breast: Secondary | ICD-10-CM | POA: Insufficient documentation

## 2011-07-11 DIAGNOSIS — Z139 Encounter for screening, unspecified: Secondary | ICD-10-CM

## 2011-07-11 DIAGNOSIS — J449 Chronic obstructive pulmonary disease, unspecified: Secondary | ICD-10-CM | POA: Insufficient documentation

## 2011-07-11 DIAGNOSIS — I10 Essential (primary) hypertension: Secondary | ICD-10-CM | POA: Insufficient documentation

## 2011-07-11 IMAGING — US US ABDOMEN COMPLETE
1 series · 14 of 25 positions shown · non-contrast
Comparison: [DATE] CT abdomen.

CLINICAL DATA: Abdominal pain.

COMPLETE ABDOMINAL ULTRASOUND

[Series 1: us abdomen complete · 0.26mm/px · 14 of 62 slices shown]
[im 1/62]
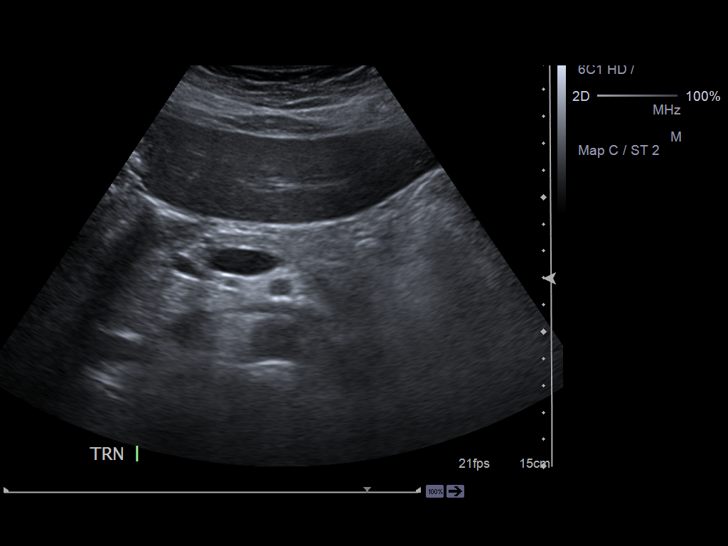
[im 6/62]
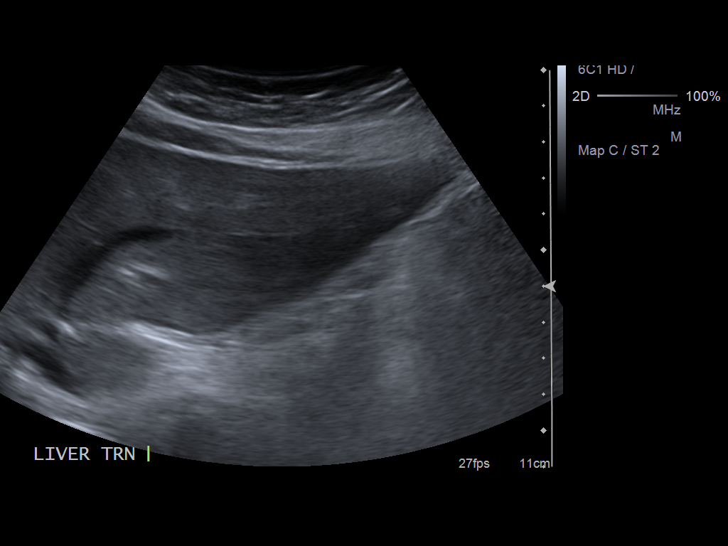
[im 11/62]
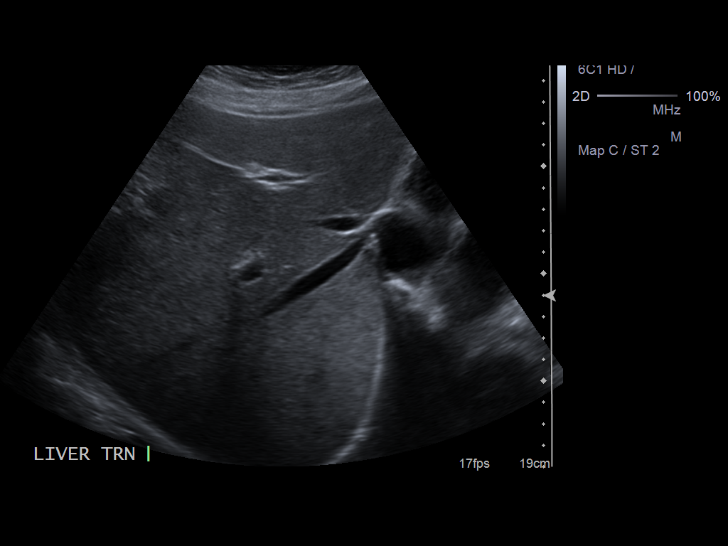
[im 16/62]
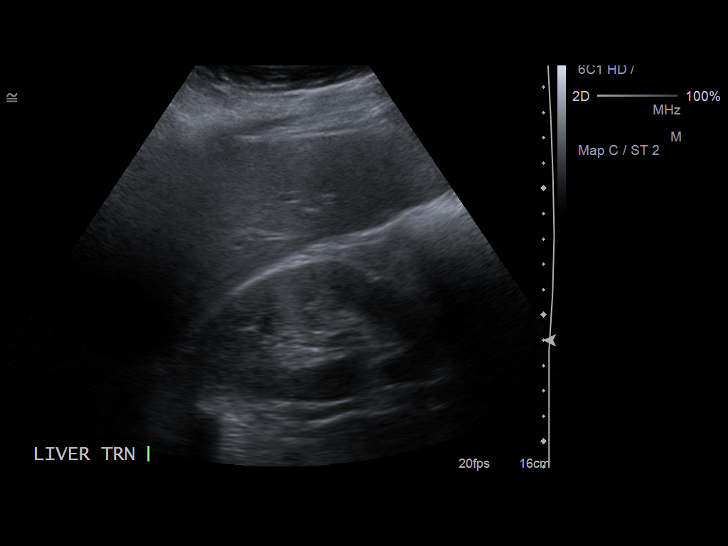
[im 21/62]
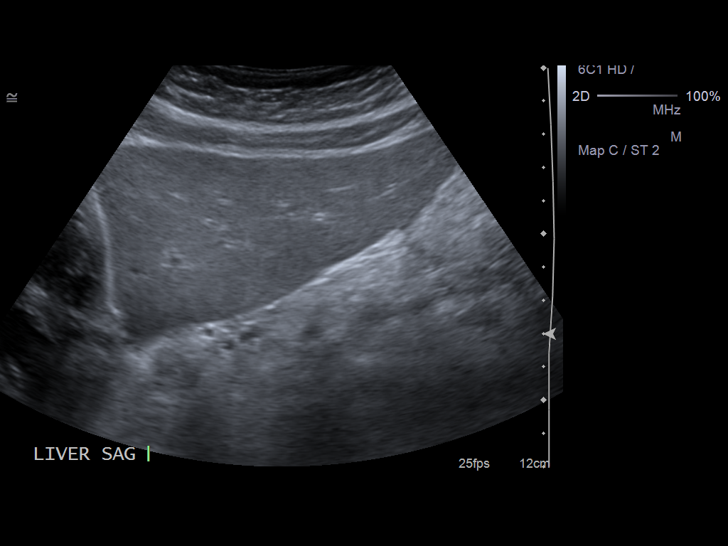
[im 23/62]
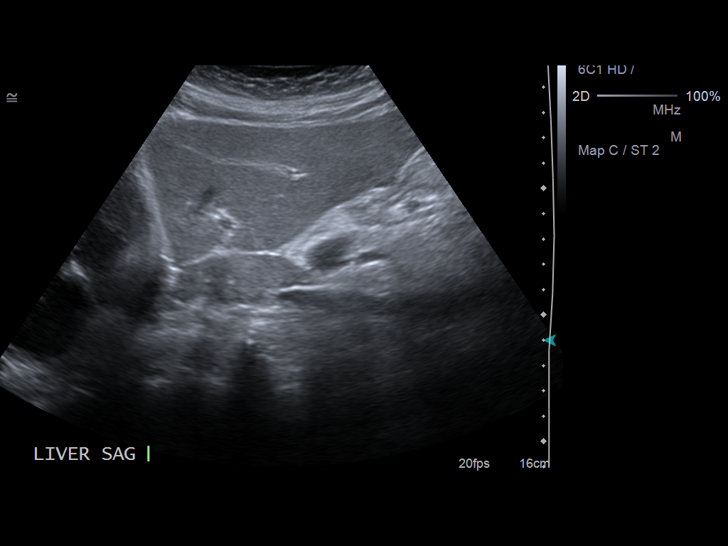
[im 28/62]
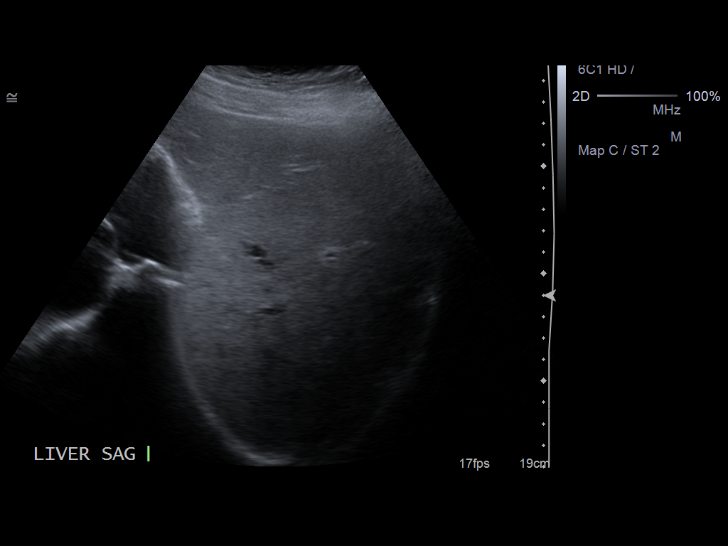
[im 34/62]
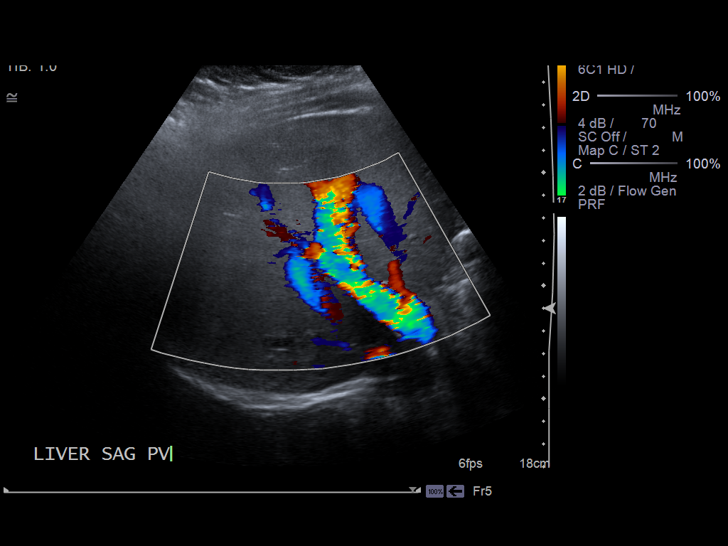
[im 39/62]
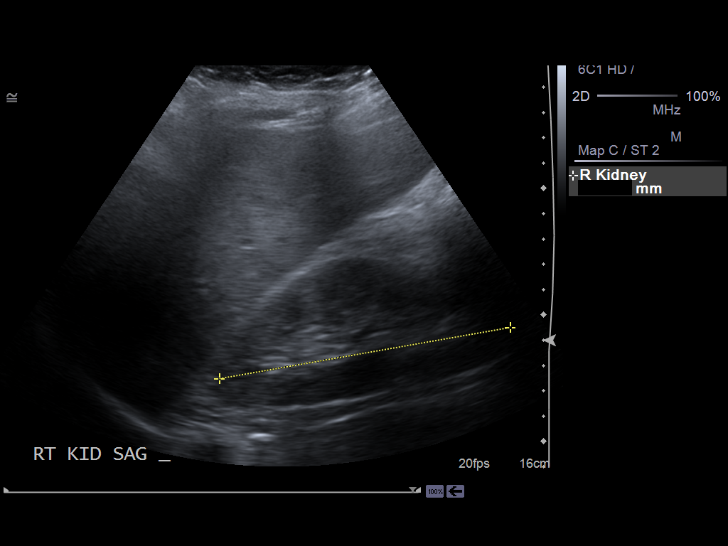
[im 41/62]
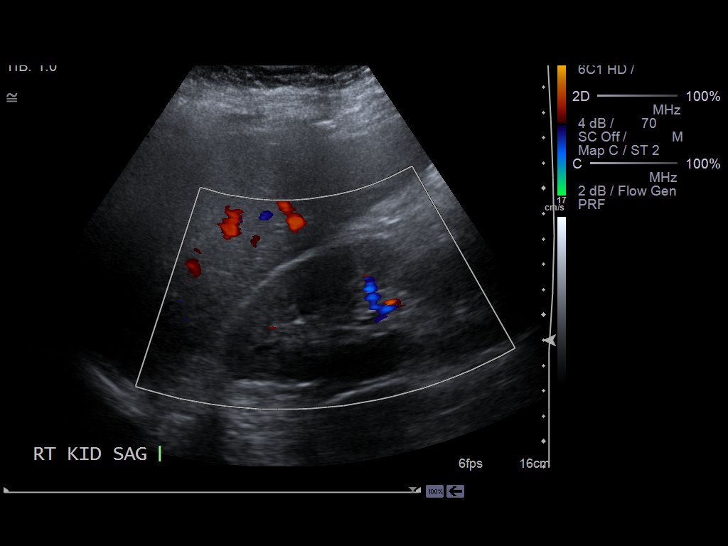
[im 46/62]
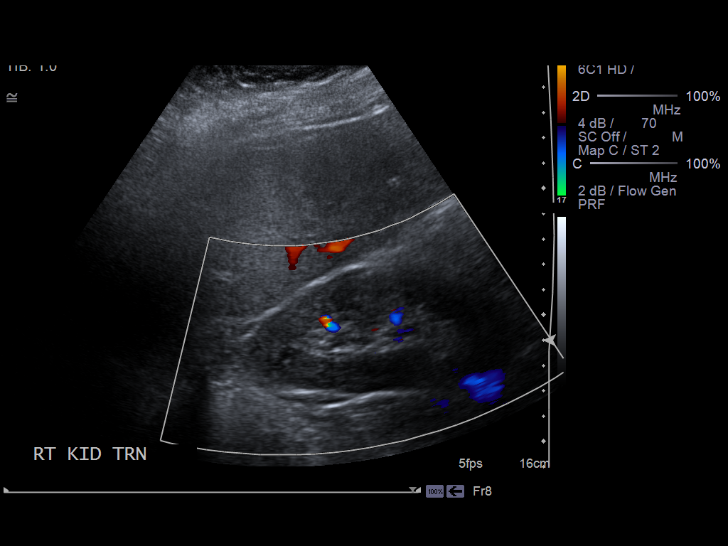
[im 51/62]
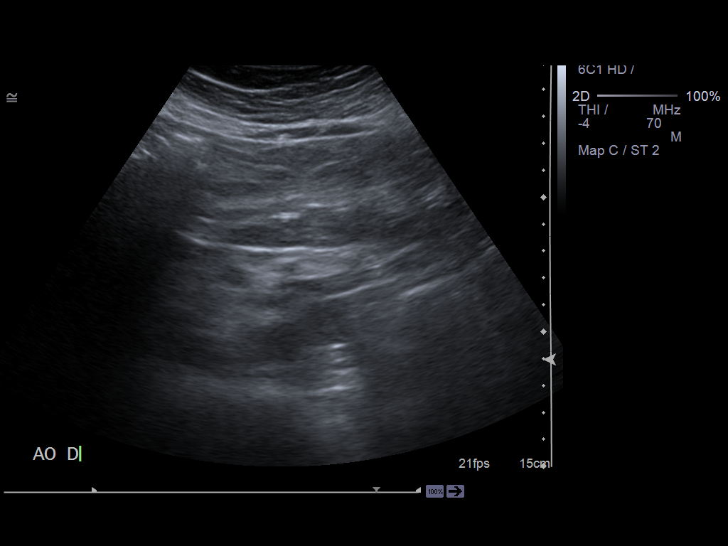
[im 56/62]
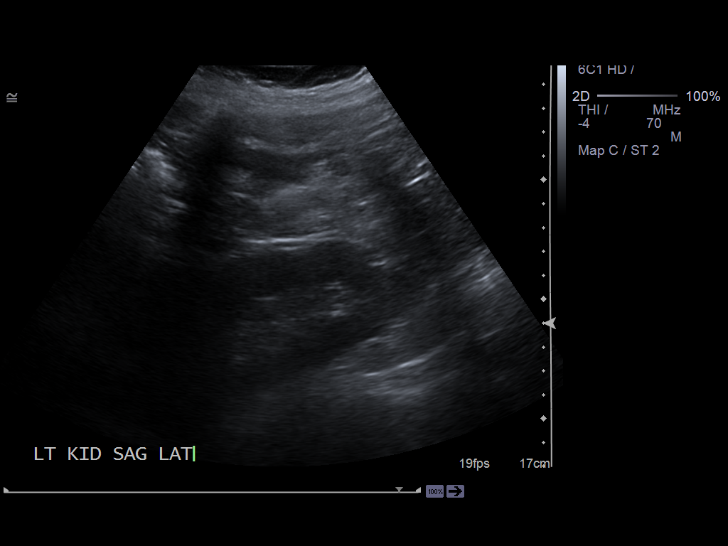
[im 62/62]
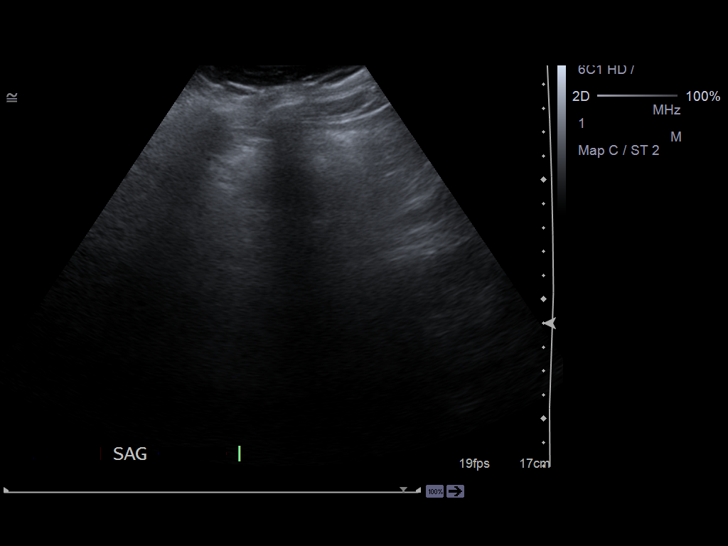

[14 of 25 positions shown; findings below may reference images not displayed]

FINDINGS: Gallbladder:  Cholecystectomy.

Common bile duct:  8.4 mm, allowable status post cholecystectomy.

Liver:  No focal lesion identified.  Within normal limits in
parenchymal echogenicity.

IVC:  Appears normal.

Pancreas:  No focal abnormality seen.

Spleen:  Obscured by overlying bowel gas.

Right Kidney:  11.7 cm. Normal echotexture.  Normal central sinus
echo complex.  No calculi or hydronephrosis.

Left Kidney:  11.0 cm. Normal echotexture.  Normal central sinus
echo complex.  No calculi or hydronephrosis.

Abdominal aorta:  27 mm proximally with anatomic tapering.  No
aneurysm.
IMPRESSION: 1.  Cholecystectomy.Post cholecystectomy dilation of the common
bile duct.
2.  Otherwise normal abdominal ultrasound.  Spleen obscured by
overlying bowel gas.

## 2011-07-11 IMAGING — CR DG CHEST 2V
2 series · 2 of 2 positions shown · non-contrast
Comparison: [DATE]

CLINICAL DATA: Chronic diastolic heart failure

CHEST - 2 VIEW

[view not recorded (1 of 2)]
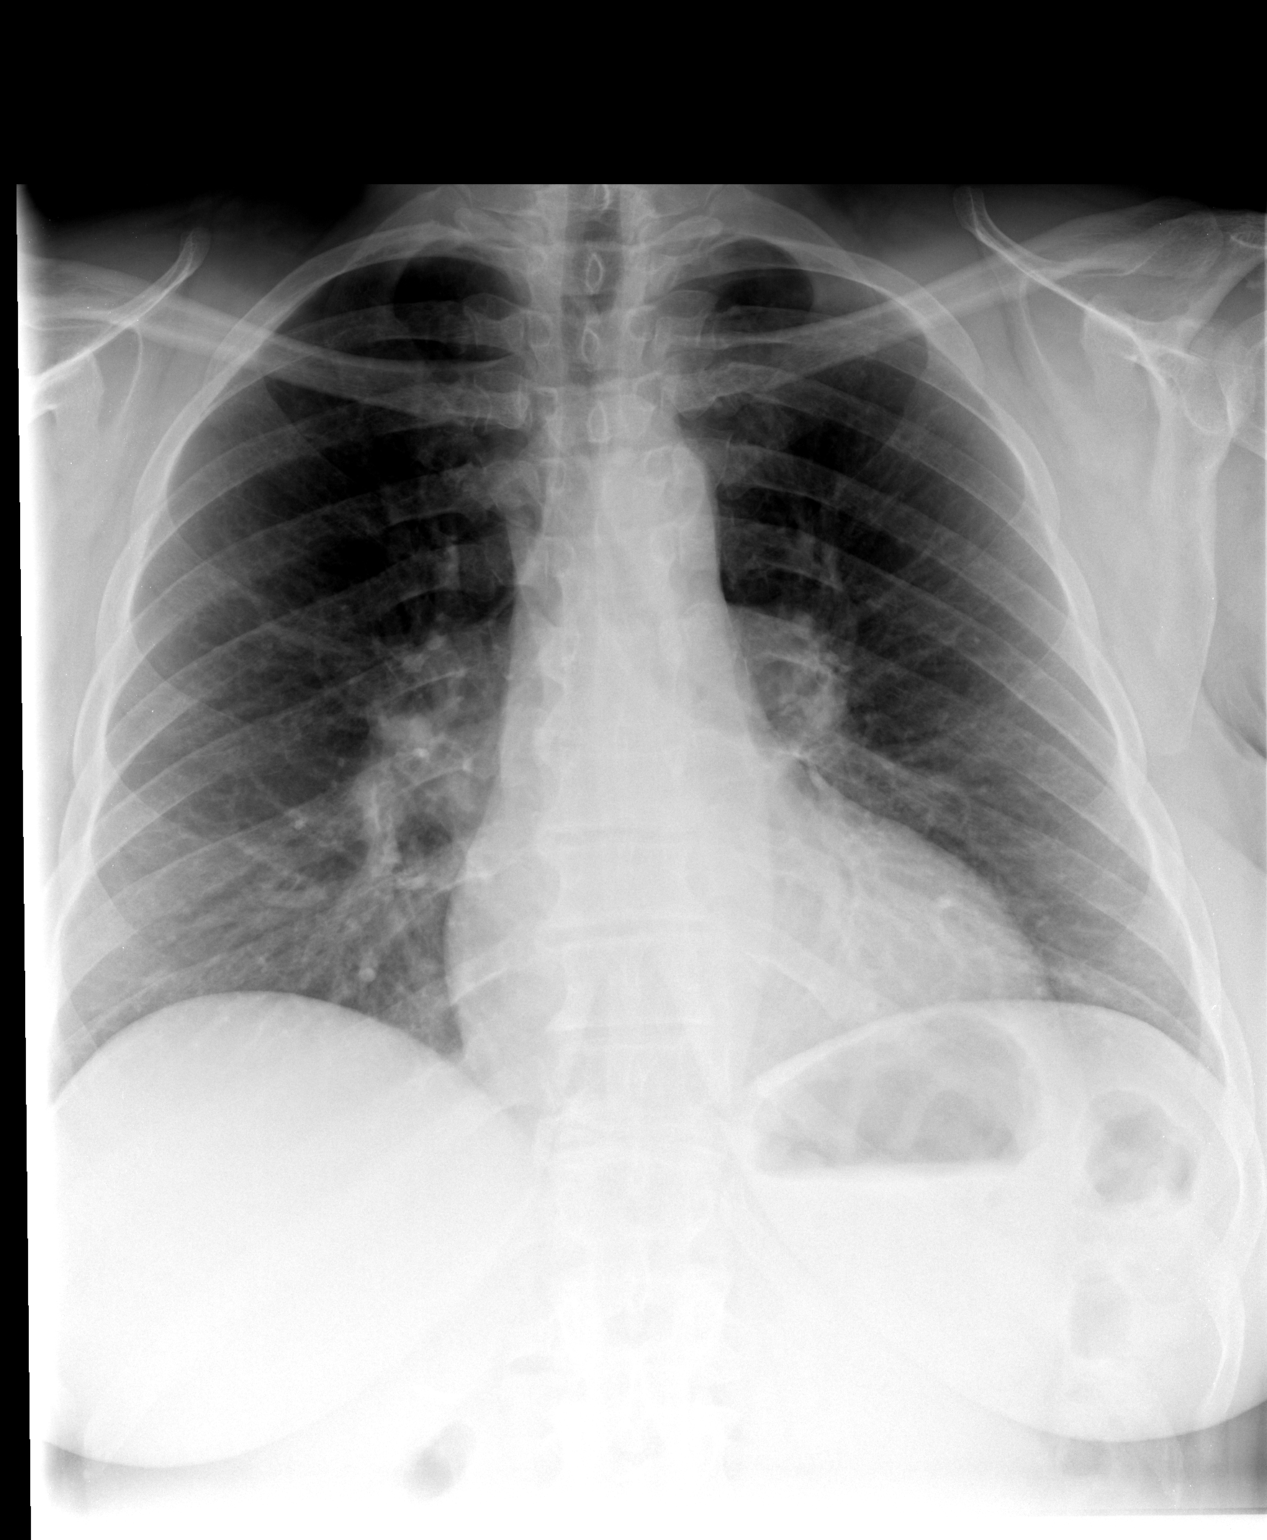

[view not recorded (2 of 2)]
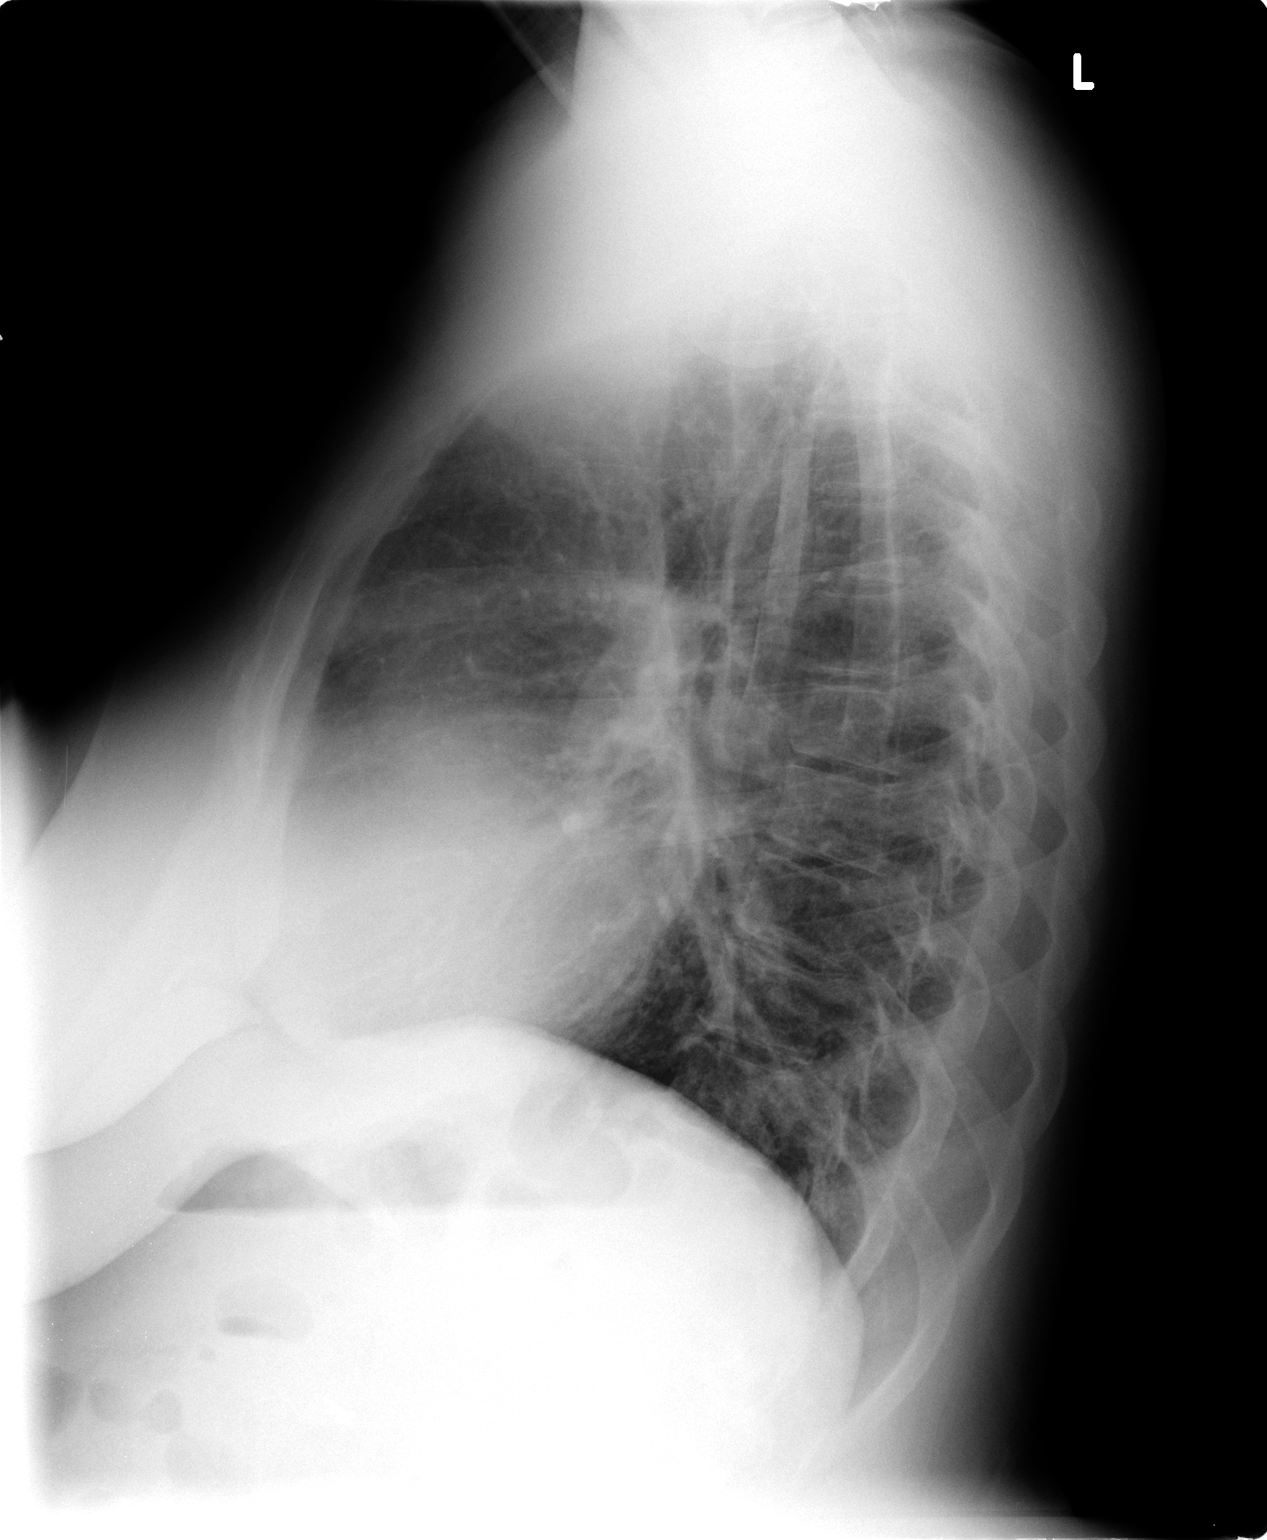

[2 of 2 positions shown; findings below may reference images not displayed]

FINDINGS: Cardiomediastinal silhouette is stable.  No acute
infiltrate or pleural effusion.  No pulmonary edema.  Bony thorax
is stable.
IMPRESSION: No active disease.  No significant change.

## 2011-07-11 NOTE — Progress Notes (Signed)
*  PRELIMINARY RESULTS* Echocardiogram 2D Echocardiogram has been performed.  Conrad Leander 07/11/2011, 10:43 AM

## 2011-07-12 ENCOUNTER — Encounter: Payer: Self-pay | Admitting: Adult Health

## 2011-07-12 LAB — COMPREHENSIVE METABOLIC PANEL
ALT: 13 U/L (ref 0–35)
AST: 18 U/L (ref 0–37)
Albumin: 3.7 g/dL (ref 3.5–5.2)
Alkaline Phosphatase: 73 U/L (ref 39–117)
BUN: 12 mg/dL (ref 6–23)
CO2: 25 mEq/L (ref 19–32)
Calcium: 9.3 mg/dL (ref 8.4–10.5)
Chloride: 104 mEq/L (ref 96–112)
Creat: 0.81 mg/dL (ref 0.50–1.10)
Glucose, Bld: 60 mg/dL — ABNORMAL LOW (ref 70–99)
Potassium: 4.7 mEq/L (ref 3.5–5.3)
Sodium: 140 mEq/L (ref 135–145)
Total Bilirubin: 0.4 mg/dL (ref 0.3–1.2)
Total Protein: 6.3 g/dL (ref 6.0–8.3)

## 2011-08-02 ENCOUNTER — Ambulatory Visit: Payer: Medicaid Other | Admitting: Adult Health

## 2011-08-12 ENCOUNTER — Other Ambulatory Visit (HOSPITAL_COMMUNITY)
Admission: RE | Admit: 2011-08-12 | Discharge: 2011-08-12 | Disposition: A | Discharge status: Home / Self Care | Payer: Medicaid Other | Source: Ambulatory Visit | Attending: Obstetrics and Gynecology | Admitting: Obstetrics and Gynecology

## 2011-08-12 ENCOUNTER — Other Ambulatory Visit: Payer: Self-pay | Admitting: Obstetrics and Gynecology

## 2011-08-12 DIAGNOSIS — Z01419 Encounter for gynecological examination (general) (routine) without abnormal findings: Secondary | ICD-10-CM | POA: Insufficient documentation

## 2011-08-13 ENCOUNTER — Ambulatory Visit: Payer: Medicaid Other | Admitting: Adult Health

## 2011-08-16 ENCOUNTER — Ambulatory Visit: Payer: Medicaid Other | Admitting: Adult Health

## 2011-08-19 ENCOUNTER — Ambulatory Visit: Payer: Medicaid Other | Admitting: Adult Health

## 2011-08-26 ENCOUNTER — Encounter (HOSPITAL_COMMUNITY): Payer: Self-pay | Admitting: Pharmacy Technician

## 2011-09-04 ENCOUNTER — Encounter (HOSPITAL_COMMUNITY)
Admission: RE | Admit: 2011-09-04 | Discharge: 2011-09-04 | Disposition: A | Discharge status: Home / Self Care | Payer: Medicaid Other | Source: Ambulatory Visit | Attending: Obstetrics and Gynecology | Admitting: Obstetrics and Gynecology

## 2011-09-04 ENCOUNTER — Other Ambulatory Visit: Payer: Self-pay | Admitting: Obstetrics and Gynecology

## 2011-09-04 ENCOUNTER — Encounter (HOSPITAL_COMMUNITY): Payer: Self-pay

## 2011-09-04 HISTORY — DX: Heart failure, unspecified: I50.9

## 2011-09-04 LAB — BASIC METABOLIC PANEL
BUN: 18 mg/dL (ref 6–23)
CO2: 25 mEq/L (ref 19–32)
Calcium: 10 mg/dL (ref 8.4–10.5)
Chloride: 102 mEq/L (ref 96–112)
Creatinine, Ser: 0.91 mg/dL (ref 0.50–1.10)
GFR calc Af Amer: 87 mL/min — ABNORMAL LOW (ref >90)
GFR calc non Af Amer: 75 mL/min — ABNORMAL LOW (ref >90)
Glucose, Bld: 176 mg/dL — ABNORMAL HIGH (ref 70–99)
Potassium: 4.4 mEq/L (ref 3.5–5.1)
Sodium: 136 mEq/L (ref 135–145)

## 2011-09-04 LAB — CBC
HCT: 42.3 % (ref 36.0–46.0)
Hemoglobin: 14.2 g/dL (ref 12.0–15.0)
MCH: 30.1 pg (ref 26.0–34.0)
MCHC: 33.6 g/dL (ref 30.0–36.0)
MCV: 89.6 fL (ref 78.0–100.0)
Platelets: 258 10*3/uL (ref 150–400)
RBC: 4.72 MIL/uL (ref 3.87–5.11)
RDW: 14.3 % (ref 11.5–15.5)
WBC: 8.1 10*3/uL (ref 4.0–10.5)

## 2011-09-04 LAB — HCG, SERUM, QUALITATIVE: Preg, Serum: NEGATIVE

## 2011-09-04 LAB — SURGICAL PCR SCREEN
MRSA, PCR: NEGATIVE
Staphylococcus aureus: NEGATIVE

## 2011-09-04 MED ORDER — PEG 3350-KCL-NABCB-NACL-NASULF 236 G PO SOLR: 2000.0000 mL | Freq: Once | ORAL | Status: DC

## 2011-09-04 NOTE — Patient Instructions (Signed)
20 Robin [name]  09/04/2011   Your procedure is scheduled on:  09/10/11  Report to [facility] at 08:35 AM.  Call this number if you have problems the morning of surgery: [phone]   Remember:   Do not eat food:After Midnight.  May have clear liquids:until Midnight .  Clear liquids include soda, tea, black coffee, apple or grape juice, broth.  Take these medicines the morning of surgery with A SIP OF WATER: Enalapril, Felodipine, Hydrochlorothiazide and Omeprazole. Take your Hydrocodone only if needed. Also, take your inhaler, Combivent and bring it with you to the hospital.   Do not wear jewelry, make-up or nail polish.  Do not wear lotions, powders, or perfumes. You may wear deodorant.  Do not shave 48 hours prior to surgery.  Do not bring valuables to the hospital.  Contacts, dentures or bridgework may not be worn into surgery.  Leave suitcase in the car. After surgery it may be brought to your room.  For patients admitted to the hospital, checkout time is 11:00 AM the day of discharge.   Patients discharged the day of surgery will not be allowed to drive home.  Name and phone number of your driver:   Special Instructions: CHG Shower Use Special Wash: 1/2 bottle night before surgery and 1/2 bottle morning of surgery.   Please read over the following fact sheets that you were given: Pain Booklet, MRSA Information, Surgical Site Infection Prevention, Anesthesia Post-op Instructions and Care and Recovery After Surgery    Hysterectomy A hysterectomy is a procedure where your womb (uterus) is surgically taken out. It will no longer be possible to have menstrual periods or to become pregnant. Removal of the tubes and ovaries (bilateral salpingo-oopherectomy) can be done during this operation as well.   An abdominal hysterectomy is done through a large cut (incision) in the abdomen made by the surgeon.   A vaginal hysterectomy is done through the vagina. There are no abdominal incisions,  but there will be incisions on the inside of the vagina.   A laparoscopic assisted vaginal hysterectomy is done through 2 or 3 small incisions in the abdomen, but the uterus is removed and passed through the vagina.  Women who are going to have a hysterectomy should be tested first to make sure there is no cancer of the cervix or in the uterus. INDICATIONS FOR HYSTERECTOMY:  Persistent abnormal bleeding.   Lasting (chronic) pelvic pain.   Endometriosis. This is when the lining of the uterus (endometrium) is misplaced outside of its normal location.   Adenomyosis. This is when the endometrium tissue grows in the muscle of the uterus.   Uterine prolapse. This is when the uterus falls down into the vagina.   Cancer of the uterus or cervix that requires a radical hysterectomy, removal of the uterus, tubes, ovaries, and surrounding lymph nodes.  LET YOUR CAREGIVER KNOW ABOUT:  Allergies (especially to medicines).   Medications taken including herbs, eye drops, over the counter medications, and creams.   Use of steroids (by mouth or creams).   Past problems with anesthetics or numbing medication.   Possibility of pregnancy, if this applies.   History of blood clots (thrombophlebitis).   History of bleeding or blood problems.   Past surgery.   Other health problems.  RISKS AND COMPLICATIONS All surgeries can have risks. Some of these risks are:  A lot of bleeding.   Injury to surrounding organs.   Infection.   Blood clots of the leg,  heart, or lung.   Problems with anesthesia.   Early menopause.  BEFORE THE PROCEDURE  Do not take aspirin or blood thinners for a week before surgery, or as directed by your caregiver.   Do not eat or drink anything after midnight the night before surgery, or as directed by your caregiver.   Let your caregiver know if you get a cold or other infectious problems before surgery.   If you are being admitted the day of surgery, you should  be present 60 minutes before your procedure or as told by your caregiver.  PROCEDURE   An IV (intravenous) will be placed in your arm. You will be given a drug to make you sleep (anesthetic) during surgery. You may be given a shot in the spine (spinal anesthesia) that will numb your body from the waist down. This will keep you pain-free during surgery.   When you wake from surgery, you will have the IV and a long, narrow, hollow tube (urinary catheter) draining the bladder for 1 or 2 days after surgery. This will make passing your urine easier. It also helps by keeping your bladder empty during surgery.   After surgery, you will be taken to the recovery area where a nurse will watch and check your progress. Once you wake up, stable and taking fluids well, without other problems, you will be allowed to return to your room. Usually you will remain in the hospital 3 to 5 days. You may be given an antibiotic during and after the surgery and when you go home. Pain medication will be ordered by your caregiver while you are in the hospital and when you go home.  HOME CARE INSTRUCTIONS  Healing will take time. You will have discomfort, tenderness, swelling, and bruising at the operative site for a couple of weeks. This is normal and will get better as time goes on.   Only take over-the-counter or prescription medicines for pain, discomfort, or fever as directed by your caregiver.   Do not take aspirin. It can cause bleeding.   Do not drive when taking pain medication.   Follow your caregiver's advice regarding diet, exercise, lifting, driving, and general activities.   Resume your usual diet as directed and allowed.   Get plenty of rest and sleep.   Do not douche, use tampons, or have sexual intercourse until your caregiver gives you permission.   Change your bandages (dressings) as directed.   Take your temperature twice a day. Write it down.   Your caregiver may recommend showers instead of  baths for a few weeks.   Do not drink alcohol until your caregiver gives you permission.   If you develop constipation, you may take a mild laxative with your caregiver's permission. Bran foods and drinking fluids helps with constipation problems.   Try to have someone home with you for a week or two to help with the household activities.   Make sure you and your family understands everything about your operation and recovery.   Do not sign any legal documents until you feel normal again.   Keep all your follow-up appointments as recommended by your caregiver.  SEEK MEDICAL CARE IF:   There is swelling, redness, or increasing pain in the wound area.   Pus is coming from the wound.   You notice a bad smell from the wound or surgical dressing.   You have pain, redness, and swelling from the intravenous site.   The wound is breaking open (the  edges are not staying together).   You feel dizzy or feel like fainting.   You develop pain or bleeding when you urinate.   You develop diarrhea.   You develop nausea and vomiting.   You develop abnormal vaginal discharge.   You develop a rash.   You have any type of abnormal reaction or develop an allergy to your medication.   You need stronger pain medication for your pain.  SEEK IMMEDIATE MEDICAL CARE IF:  You have a fever.   You develop abdominal pain.   You develop chest pain.   You develop shortness of breath.   You pass out.   You develop pain, swelling, or redness of your leg.   You develop heavy vaginal bleeding with or without blood clots.  Document Released: 01/15/2001 Document Revised: 04/03/2011 Document Reviewed: 12/03/2007 [facility] Patient Information 2012 [place], [place].

## 2011-09-07 LAB — TYPE AND SCREEN
ABO/RH(D): A NEG
Antibody Screen: NEGATIVE

## 2011-09-09 ENCOUNTER — Other Ambulatory Visit: Payer: Self-pay | Admitting: Obstetrics and Gynecology

## 2011-09-09 NOTE — H&P (Signed)
Robin [name] is an 46 y.o. female. She is admitted for abdominal hysterectomy with preservation of tubes and ovaries and wide excision of old cicatrix to include scar tissue on the right corner of the incision she is undergoing hysterectomy due to fibroid enlargement of the uterus, and failed endometrial ablation which was performed December 2000 and the patient was dissatisfied with the discomfort of her menses which only last 3 days one of which includes clots and significant discomfort she has discomfort in her lower abdomen at the right corner of the incision where she had poor healing of her original surgical incision and had to have the incision heal by secondary intention. All recent Pap smears are normal most recently 08/13/2011 patient declines consideration of IUD and other treatment alternatives such as hormonal management are declined. Her ultrasounds have confirmed fibroids in the anterior wall of the uterus not deforming the endometrial cavity. GC and Chlamydia cultures are negative 09/04/2011 trichomonas vaginalis was treated as was used infection noted  Pertinent Gynecological History: Menses: flow is light and usually lasting 3 to For days Bleeding: Regular Contraception: tubal ligation DES exposure: denies Blood transfusions: none Sexually transmitted diseases: Trichomonas treated January 2013 Previous GYN Procedures: Endometrial ablation 2009 cesarean section x3 Last mammogram: Unknown Date:  Last pap: Normal with yeast and Trichomonas both treated Date: January 2013 OB History: G 3, P3   Menstrual History: Menarche age:  Patient's last menstrual period was 08/21/2011.    Past Medical History  Diagnosis Date  . Type 2 diabetes mellitus   . Essential hypertension, benign   . GERD (gastroesophageal reflux disease)   . COPD (chronic obstructive pulmonary disease)   . Arthritis   . Normal coronary arteries     3/10 - following abnormal Myoview  . Diabetes mellitus   .  CHF (congestive heart failure)     Past Surgical History  Procedure Date  . Tubal ligation   . Cholecystectomy   . Cesarean section     Family History  Problem Relation Age of Onset  . Cirrhosis Mother   . Diabetes type II Father   . Diabetes type II Sister   . Anesthesia problems Neg Hx   . Hypotension Neg Hx   . Malignant hyperthermia Neg Hx   . Pseudochol deficiency Neg Hx     Social History:  reports that she has been smoking Cigarettes.  She has a 7.25 pack-year smoking history. She has never used smokeless tobacco. She reports that she drinks about 6 ounces of alcohol per week. She reports that she does not use illicit drugs.  Allergies:  Allergies  Allergen Reactions  . Bee Venom Swelling    HAS HAD AN EPI PEN IN THE PAST     (Not in a hospital admission)  ROS  Last menstrual period 08/21/2011. Physical Exam Physical Examination: General appearance - alert, well appearing, and in no distress, oriented to Milosevic, place, and time and overweight Mental status - alert, oriented to Reddin, place, and time, normal mood, behavior, speech, dress, motor activity, and thought processes Mouth - mucous membranes moist, pharynx normal without lesions and dental hygiene good Chest - clear to auscultation, no wheezes, rales or rhonchi, symmetric air entry Heart - normal rate and regular rhythm 2/6 systolic ejection murmur right upper sternal border Abdomen - soft, nontender, nondistended, no masses or organomegaly scars from previous incisions fibrotic Pfannenstiel incision with thickness in tenderness at the right corner of the incision Pelvic - VULVA: normal appearing vulva  with no masses, tenderness or lesions, VAGINA: normal appearing vagina with normal color and discharge, no lesions, adequate support, CERVIX: normal appearing cervix without discharge or lesions, UTERUS: uterus is normal size, shape, consistency and nontender, enlarged to 8-10 weeks size, irregular, mobile,  ADNEXA: normal adnexa in size, nontender and no masses,  Extremities - peripheral pulses normal, no pedal edema, no clubbing or cyanosis, Homan's sign negative bilaterally  No results found for this or any previous visit (from the past 24 hour(s)). CBC    Component Value Date/Time   WBC 8.1 09/04/2011 1140   RBC 4.72 09/04/2011 1140   HGB 14.2 09/04/2011 1140   HCT 42.3 09/04/2011 1140   PLT 258 09/04/2011 1140   MCV 89.6 09/04/2011 1140   MCH 30.1 09/04/2011 1140   MCHC 33.6 09/04/2011 1140   RDW 14.3 09/04/2011 1140   LYMPHSABS 2.4 06/07/2011 0757   MONOABS 0.7 06/07/2011 0757   EOSABS 0.1 06/07/2011 0757   BASOSABS 0.0 06/07/2011 0757    BMET    Component Value Date/Time   NA 136 09/04/2011 1140   K 4.4 09/04/2011 1140   CL 102 09/04/2011 1140   CO2 25 09/04/2011 1140   GLUCOSE 176* 09/04/2011 1140   BUN 18 09/04/2011 1140   CREATININE 0.91 09/04/2011 1140   CREATININE 0.81 07/11/2011 0725   CALCIUM 10.0 09/04/2011 1140   GFRNONAA 75* 09/04/2011 1140   GFRAA 87* 09/04/2011 1140   No results found.  Assessment/Pla11 n1 uterine fibroids 8-10 weeks size  2 dysmenorrhea  bdominal wall fibrosis with discomfort abdominal hysterectomy with preservation of tubes and ovaries if clinically possible a2s determined at the time of surgery  Risks of procedure including bleeding infection or injury to adjacent organs reviewed with patient bowel prep was performed for the day before surgery antibiotics administered.  Amogh Komatsu V 09/09/2011, 6:17 PM

## 2011-09-10 ENCOUNTER — Encounter (HOSPITAL_COMMUNITY): Payer: Self-pay | Admitting: *Deleted

## 2011-09-10 ENCOUNTER — Encounter (HOSPITAL_COMMUNITY)
Admission: RE | Disposition: A | Discharge status: Home / Self Care | Payer: Self-pay | Source: Ambulatory Visit | Attending: Obstetrics and Gynecology

## 2011-09-10 ENCOUNTER — Inpatient Hospital Stay (HOSPITAL_COMMUNITY)
Admission: RE | Admit: 2011-09-10 | Discharge: 2011-09-12 | DRG: 743 | Disposition: A | Discharge status: Home / Self Care | Payer: Medicaid Other | Source: Ambulatory Visit | Attending: Obstetrics and Gynecology | Admitting: Obstetrics and Gynecology

## 2011-09-10 ENCOUNTER — Encounter (HOSPITAL_COMMUNITY): Payer: Self-pay | Admitting: Anesthesiology

## 2011-09-10 ENCOUNTER — Other Ambulatory Visit: Payer: Self-pay | Admitting: Obstetrics and Gynecology

## 2011-09-10 ENCOUNTER — Inpatient Hospital Stay (HOSPITAL_COMMUNITY): Payer: Medicaid Other | Admitting: Anesthesiology

## 2011-09-10 DIAGNOSIS — I509 Heart failure, unspecified: Secondary | ICD-10-CM | POA: Diagnosis present

## 2011-09-10 DIAGNOSIS — R05 Cough: Secondary | ICD-10-CM | POA: Diagnosis present

## 2011-09-10 DIAGNOSIS — L905 Scar conditions and fibrosis of skin: Secondary | ICD-10-CM | POA: Diagnosis present

## 2011-09-10 DIAGNOSIS — J4489 Other specified chronic obstructive pulmonary disease: Secondary | ICD-10-CM | POA: Diagnosis present

## 2011-09-10 DIAGNOSIS — K219 Gastro-esophageal reflux disease without esophagitis: Secondary | ICD-10-CM | POA: Diagnosis present

## 2011-09-10 DIAGNOSIS — D259 Leiomyoma of uterus, unspecified: Principal | ICD-10-CM | POA: Diagnosis present

## 2011-09-10 DIAGNOSIS — M129 Arthropathy, unspecified: Secondary | ICD-10-CM | POA: Diagnosis present

## 2011-09-10 DIAGNOSIS — J449 Chronic obstructive pulmonary disease, unspecified: Secondary | ICD-10-CM | POA: Diagnosis present

## 2011-09-10 DIAGNOSIS — Z01812 Encounter for preprocedural laboratory examination: Secondary | ICD-10-CM

## 2011-09-10 DIAGNOSIS — N946 Dysmenorrhea, unspecified: Secondary | ICD-10-CM | POA: Diagnosis present

## 2011-09-10 DIAGNOSIS — R42 Dizziness and giddiness: Secondary | ICD-10-CM | POA: Diagnosis present

## 2011-09-10 DIAGNOSIS — R059 Cough, unspecified: Secondary | ICD-10-CM | POA: Diagnosis present

## 2011-09-10 DIAGNOSIS — D219 Benign neoplasm of connective and other soft tissue, unspecified: Secondary | ICD-10-CM | POA: Diagnosis present

## 2011-09-10 DIAGNOSIS — F17200 Nicotine dependence, unspecified, uncomplicated: Secondary | ICD-10-CM | POA: Diagnosis present

## 2011-09-10 DIAGNOSIS — I10 Essential (primary) hypertension: Secondary | ICD-10-CM | POA: Diagnosis present

## 2011-09-10 DIAGNOSIS — E119 Type 2 diabetes mellitus without complications: Secondary | ICD-10-CM | POA: Diagnosis present

## 2011-09-10 HISTORY — PX: SCAR REVISION: SHX5285

## 2011-09-10 HISTORY — PX: ABDOMINAL HYSTERECTOMY: SHX81

## 2011-09-10 LAB — GLUCOSE, CAPILLARY
Glucose-Capillary: 119 mg/dL — ABNORMAL HIGH (ref 70–99)
Glucose-Capillary: 157 mg/dL — ABNORMAL HIGH (ref 70–99)
Glucose-Capillary: 226 mg/dL — ABNORMAL HIGH (ref 70–99)

## 2011-09-10 SURGERY — HYSTERECTOMY, ABDOMINAL
Anesthesia: General | Site: Abdomen | Wound class: Clean Contaminated

## 2011-09-10 MED ORDER — CEFAZOLIN SODIUM-DEXTROSE 2-3 GM-% IV SOLR: 2.0000 g | INTRAVENOUS | Status: DC

## 2011-09-10 MED ORDER — FENTANYL CITRATE 0.05 MG/ML IJ SOLN
INTRAMUSCULAR | Status: AC
  Filled 2011-09-10: qty 2

## 2011-09-10 MED ORDER — HYDROMORPHONE 0.3 MG/ML IV SOLN
INTRAVENOUS | Status: AC
  Filled 2011-09-10: qty 25

## 2011-09-10 MED ORDER — CEFAZOLIN SODIUM 1-5 GM-% IV SOLN
INTRAVENOUS | Status: DC | PRN
  Administered 2011-09-10: 2 g via INTRAVENOUS

## 2011-09-10 MED ORDER — ONDANSETRON HCL 4 MG/2ML IJ SOLN
4.0000 mg | Freq: Once | INTRAMUSCULAR | Status: AC
  Administered 2011-09-10: 4 mg via INTRAVENOUS

## 2011-09-10 MED ORDER — ONDANSETRON HCL 4 MG/2ML IJ SOLN
INTRAMUSCULAR | Status: AC
  Filled 2011-09-10: qty 2

## 2011-09-10 MED ORDER — LIDOCAINE HCL 1 % IJ SOLN
INTRAMUSCULAR | Status: DC | PRN
  Administered 2011-09-10: 25 mg via INTRADERMAL

## 2011-09-10 MED ORDER — ROCURONIUM BROMIDE 50 MG/5ML IV SOLN
INTRAVENOUS | Status: AC
  Filled 2011-09-10: qty 1

## 2011-09-10 MED ORDER — KETOROLAC TROMETHAMINE 30 MG/ML IJ SOLN
30.0000 mg | Freq: Four times a day (QID) | INTRAMUSCULAR | Status: AC
  Administered 2011-09-10 – 2011-09-11 (×5): 30 mg via INTRAVENOUS
  Filled 2011-09-10 (×5): qty 1

## 2011-09-10 MED ORDER — HYDROMORPHONE 0.3 MG/ML IV SOLN
INTRAVENOUS | Status: DC
  Administered 2011-09-10: 15:00:00 via INTRAVENOUS
  Administered 2011-09-11: 2.99 mg via INTRAVENOUS
  Administered 2011-09-11 (×2): 1.8 mg via INTRAVENOUS
  Administered 2011-09-11: 3 mg via INTRAVENOUS
  Administered 2011-09-11: 3.3 mg via INTRAVENOUS
  Administered 2011-09-12: 4.5 mg via INTRAVENOUS
  Administered 2011-09-12: 2.4 mg via INTRAVENOUS

## 2011-09-10 MED ORDER — 0.9 % SODIUM CHLORIDE (POUR BTL) OPTIME
TOPICAL | Status: DC | PRN
  Administered 2011-09-10: 1000 mL

## 2011-09-10 MED ORDER — LABETALOL HCL 5 MG/ML IV SOLN
INTRAVENOUS | Status: AC
  Filled 2011-09-10: qty 4

## 2011-09-10 MED ORDER — PROMETHAZINE HCL 25 MG/ML IJ SOLN
INTRAMUSCULAR | Status: AC
  Administered 2011-09-10: 12.5 mg via INTRAVENOUS
  Filled 2011-09-10: qty 1

## 2011-09-10 MED ORDER — DOCUSATE SODIUM 100 MG PO CAPS
100.0000 mg | ORAL_CAPSULE | Freq: Two times a day (BID) | ORAL | Status: DC
  Administered 2011-09-10 – 2011-09-12 (×5): 100 mg via ORAL
  Filled 2011-09-10 (×6): qty 1

## 2011-09-10 MED ORDER — ROCURONIUM BROMIDE 100 MG/10ML IV SOLN
INTRAVENOUS | Status: DC | PRN
  Administered 2011-09-10: 40 mg via INTRAVENOUS
  Administered 2011-09-10 (×3): 10 mg via INTRAVENOUS

## 2011-09-10 MED ORDER — NALOXONE HCL 0.4 MG/ML IJ SOLN: 0.4000 mg | INTRAMUSCULAR | Status: DC | PRN

## 2011-09-10 MED ORDER — KETOROLAC TROMETHAMINE 30 MG/ML IJ SOLN: 30.0000 mg | Freq: Four times a day (QID) | INTRAMUSCULAR | Status: AC

## 2011-09-10 MED ORDER — ENALAPRIL MALEATE 5 MG PO TABS
20.0000 mg | ORAL_TABLET | Freq: Every day | ORAL | Status: DC
  Administered 2011-09-11 – 2011-09-12 (×2): 20 mg via ORAL
  Filled 2011-09-10 (×2): qty 4

## 2011-09-10 MED ORDER — ONDANSETRON HCL 4 MG/2ML IJ SOLN: 4.0000 mg | Freq: Once | INTRAMUSCULAR | Status: DC | PRN

## 2011-09-10 MED ORDER — KETOROLAC TROMETHAMINE 30 MG/ML IJ SOLN
INTRAMUSCULAR | Status: AC
  Administered 2011-09-10: 30 mg via INTRAVENOUS
  Filled 2011-09-10: qty 1

## 2011-09-10 MED ORDER — FENTANYL CITRATE 0.05 MG/ML IJ SOLN
INTRAMUSCULAR | Status: DC | PRN
  Administered 2011-09-10: 50 ug via INTRAVENOUS
  Administered 2011-09-10 (×2): 100 ug via INTRAVENOUS
  Administered 2011-09-10 (×2): 50 ug via INTRAVENOUS
  Administered 2011-09-10: 150 ug via INTRAVENOUS
  Administered 2011-09-10: 100 ug via INTRAVENOUS

## 2011-09-10 MED ORDER — FENTANYL CITRATE 0.05 MG/ML IJ SOLN
INTRAMUSCULAR | Status: AC
  Filled 2011-09-10: qty 5

## 2011-09-10 MED ORDER — POTASSIUM CHLORIDE IN NACL 20-0.9 MEQ/L-% IV SOLN
INTRAVENOUS | Status: DC
  Administered 2011-09-10 – 2011-09-12 (×5): via INTRAVENOUS

## 2011-09-10 MED ORDER — LACTATED RINGERS IV SOLN
INTRAVENOUS | Status: DC
  Administered 2011-09-10 (×3): via INTRAVENOUS

## 2011-09-10 MED ORDER — PROPOFOL 10 MG/ML IV EMUL
INTRAVENOUS | Status: AC
  Filled 2011-09-10: qty 20

## 2011-09-10 MED ORDER — ONDANSETRON HCL 4 MG/2ML IJ SOLN: 4.0000 mg | Freq: Four times a day (QID) | INTRAMUSCULAR | Status: DC | PRN

## 2011-09-10 MED ORDER — MIDAZOLAM HCL 2 MG/2ML IJ SOLN
1.0000 mg | INTRAMUSCULAR | Status: DC | PRN
  Administered 2011-09-10: 2 mg via INTRAVENOUS

## 2011-09-10 MED ORDER — NEOSTIGMINE METHYLSULFATE 1 MG/ML IJ SOLN
INTRAMUSCULAR | Status: DC | PRN
  Administered 2011-09-10: 3 mg via INTRAVENOUS

## 2011-09-10 MED ORDER — SODIUM CHLORIDE 0.9 % IJ SOLN: 9.0000 mL | INTRAMUSCULAR | Status: DC | PRN

## 2011-09-10 MED ORDER — KETOROLAC TROMETHAMINE 30 MG/ML IJ SOLN
30.0000 mg | Freq: Once | INTRAMUSCULAR | Status: AC
  Administered 2011-09-10: 30 mg via INTRAVENOUS

## 2011-09-10 MED ORDER — PROMETHAZINE HCL 25 MG/ML IJ SOLN: 12.5000 mg | INTRAMUSCULAR | Status: DC | PRN

## 2011-09-10 MED ORDER — DIPHENHYDRAMINE HCL 12.5 MG/5ML PO ELIX
12.5000 mg | ORAL_SOLUTION | Freq: Four times a day (QID) | ORAL | Status: DC | PRN
  Administered 2011-09-10 – 2011-09-11 (×4): 12.5 mg via ORAL
  Filled 2011-09-10 (×4): qty 5

## 2011-09-10 MED ORDER — FELODIPINE ER 5 MG PO TB24
10.0000 mg | ORAL_TABLET | Freq: Every day | ORAL | Status: DC
Start: 2011-09-10 — End: 2011-09-12
  Administered 2011-09-11 – 2011-09-12 (×2): 10 mg via ORAL
  Filled 2011-09-10 (×2): qty 2

## 2011-09-10 MED ORDER — PROMETHAZINE HCL 25 MG/ML IJ SOLN
12.5000 mg | Freq: Once | INTRAMUSCULAR | Status: AC
  Administered 2011-09-10: 12.5 mg via INTRAVENOUS

## 2011-09-10 MED ORDER — GLIMEPIRIDE 2 MG PO TABS
4.0000 mg | ORAL_TABLET | Freq: Every day | ORAL | Status: DC
  Administered 2011-09-11: 4 mg via ORAL
  Filled 2011-09-10: qty 2

## 2011-09-10 MED ORDER — ZOLPIDEM TARTRATE 5 MG PO TABS: 5.0000 mg | ORAL_TABLET | Freq: Every evening | ORAL | Status: DC | PRN

## 2011-09-10 MED ORDER — LABETALOL HCL 5 MG/ML IV SOLN
INTRAVENOUS | Status: DC | PRN
  Administered 2011-09-10: 5 mg via INTRAVENOUS
  Administered 2011-09-10: 10 mg via INTRAVENOUS
  Administered 2011-09-10: 5 mg via INTRAVENOUS

## 2011-09-10 MED ORDER — MIDAZOLAM HCL 5 MG/5ML IJ SOLN
INTRAMUSCULAR | Status: DC | PRN
  Administered 2011-09-10: 2 mg via INTRAVENOUS

## 2011-09-10 MED ORDER — FENTANYL CITRATE 0.05 MG/ML IJ SOLN
INTRAMUSCULAR | Status: AC
  Administered 2011-09-10: 50 ug via INTRAVENOUS
  Filled 2011-09-10: qty 2

## 2011-09-10 MED ORDER — IPRATROPIUM-ALBUTEROL 18-103 MCG/ACT IN AERO: 2.0000 | INHALATION_SPRAY | RESPIRATORY_TRACT | Status: DC | PRN

## 2011-09-10 MED ORDER — SIMETHICONE 80 MG PO CHEW: 80.0000 mg | CHEWABLE_TABLET | Freq: Four times a day (QID) | ORAL | Status: DC | PRN

## 2011-09-10 MED ORDER — MIDAZOLAM HCL 2 MG/2ML IJ SOLN
INTRAMUSCULAR | Status: AC
  Filled 2011-09-10: qty 2

## 2011-09-10 MED ORDER — SODIUM CHLORIDE 0.9 % IJ SOLN
INTRAMUSCULAR | Status: AC
  Filled 2011-09-10: qty 10

## 2011-09-10 MED ORDER — PANTOPRAZOLE SODIUM 40 MG PO TBEC
40.0000 mg | DELAYED_RELEASE_TABLET | Freq: Every day | ORAL | Status: DC
  Administered 2011-09-11 – 2011-09-12 (×2): 40 mg via ORAL
  Filled 2011-09-10 (×2): qty 1

## 2011-09-10 MED ORDER — INSULIN ASPART 100 UNIT/ML ~~LOC~~ SOLN
0.0000 [IU] | Freq: Three times a day (TID) | SUBCUTANEOUS | Status: DC
  Administered 2011-09-10: 3 [IU] via SUBCUTANEOUS
  Administered 2011-09-11 (×2): 2 [IU] via SUBCUTANEOUS
  Administered 2011-09-11: 3 [IU] via SUBCUTANEOUS
  Filled 2011-09-10 (×2): qty 3

## 2011-09-10 MED ORDER — OXYCODONE-ACETAMINOPHEN 5-325 MG PO TABS
1.0000 | ORAL_TABLET | ORAL | Status: DC | PRN
  Administered 2011-09-11 – 2011-09-12 (×5): 1 via ORAL
  Filled 2011-09-10 (×5): qty 1

## 2011-09-10 MED ORDER — FENTANYL CITRATE 0.05 MG/ML IJ SOLN
25.0000 ug | INTRAMUSCULAR | Status: DC | PRN
  Administered 2011-09-10 (×3): 50 ug via INTRAVENOUS

## 2011-09-10 MED ORDER — LIDOCAINE HCL (PF) 1 % IJ SOLN
INTRAMUSCULAR | Status: AC
  Filled 2011-09-10: qty 10

## 2011-09-10 MED ORDER — GLYCOPYRROLATE 0.2 MG/ML IJ SOLN
INTRAMUSCULAR | Status: DC | PRN
  Administered 2011-09-10: .4 mg via INTRAVENOUS

## 2011-09-10 MED ORDER — DIPHENHYDRAMINE HCL 50 MG/ML IJ SOLN: 12.5000 mg | Freq: Four times a day (QID) | INTRAMUSCULAR | Status: DC | PRN

## 2011-09-10 MED ORDER — CEFAZOLIN SODIUM-DEXTROSE 2-3 GM-% IV SOLR
INTRAVENOUS | Status: AC
  Filled 2011-09-10: qty 50

## 2011-09-10 MED ORDER — PROPOFOL 10 MG/ML IV BOLUS
INTRAVENOUS | Status: DC | PRN
  Administered 2011-09-10: 160 mg via INTRAVENOUS

## 2011-09-10 SURGICAL SUPPLY — 64 items
APL SKNCLS STERI-STRIP NONHPOA (GAUZE/BANDAGES/DRESSINGS) ×2
APPLIER CLIP 11 MED OPEN (CLIP)
APPLIER CLIP 13 LRG OPEN (CLIP)
APR CLP LRG 13 20 CLIP (CLIP)
APR CLP MED 11 20 MLT OPN (CLIP)
BAG HAMPER (MISCELLANEOUS) ×3 IMPLANT
BENZOIN TINCTURE PRP APPL 2/3 (GAUZE/BANDAGES/DRESSINGS) ×3 IMPLANT
BRR ADH 6X5 SEPRAFILM 1 SHT (MISCELLANEOUS)
CELLS DAT CNTRL 66122 CELL SVR (MISCELLANEOUS) IMPLANT
CLIP APPLIE 11 MED OPEN (CLIP) IMPLANT
CLIP APPLIE 13 LRG OPEN (CLIP) IMPLANT
CLOTH BEACON ORANGE TIMEOUT ST (SAFETY) ×3 IMPLANT
COVER LIGHT HANDLE STERIS (MISCELLANEOUS) ×6 IMPLANT
DRAPE WARM FLUID 44X44 (DRAPE) ×3 IMPLANT
DRESSING TELFA 8X3 (GAUZE/BANDAGES/DRESSINGS) ×3 IMPLANT
ELECT REM PT RETURN 9FT ADLT (ELECTROSURGICAL) ×3
ELECTRODE REM PT RTRN 9FT ADLT (ELECTROSURGICAL) ×2 IMPLANT
EVACUATOR DRAINAGE 10X20 100CC (DRAIN) ×2 IMPLANT
EVACUATOR SILICONE 100CC (DRAIN) ×2
FORMALIN 10 PREFIL 480ML (MISCELLANEOUS) IMPLANT
GLOVE BIOGEL PI IND STRL 7.0 (GLOVE) ×6 IMPLANT
GLOVE BIOGEL PI INDICATOR 7.0 (GLOVE) ×3
GLOVE ECLIPSE 6.5 STRL STRAW (GLOVE) ×6 IMPLANT
GLOVE ECLIPSE 9.0 STRL (GLOVE) ×3 IMPLANT
GLOVE EXAM NITRILE MD LF STRL (GLOVE) ×6 IMPLANT
GLOVE INDICATOR STER SZ 9 (GLOVE) ×3 IMPLANT
GLOVE OPTIFIT SS 6.5 STRL BRWN (GLOVE) ×3 IMPLANT
GOWN STRL REIN 3XL LVL4 (GOWN DISPOSABLE) ×3 IMPLANT
GOWN STRL REIN XL XLG (GOWN DISPOSABLE) ×9 IMPLANT
INST SET MAJOR GENERAL (KITS) ×3 IMPLANT
KIT ROOM TURNOVER APOR (KITS) ×3 IMPLANT
MANIFOLD NEPTUNE II (INSTRUMENTS) ×3 IMPLANT
NS IRRIG 1000ML POUR BTL (IV SOLUTION) ×6 IMPLANT
PACK ABDOMINAL MAJOR (CUSTOM PROCEDURE TRAY) ×3 IMPLANT
PAD ABD 5X9 TENDERSORB (GAUZE/BANDAGES/DRESSINGS) ×3 IMPLANT
PAD ARMBOARD 7.5X6 YLW CONV (MISCELLANEOUS) ×3 IMPLANT
RETRACTOR WND ALEXIS 25 LRG (MISCELLANEOUS) ×2 IMPLANT
RTRCTR WOUND ALEXIS 18CM MED (MISCELLANEOUS)
RTRCTR WOUND ALEXIS 25CM LRG (MISCELLANEOUS) ×3
SEPRAFILM MEMBRANE 5X6 (MISCELLANEOUS) IMPLANT
SET BASIN LINEN APH (SET/KITS/TRAYS/PACK) ×3 IMPLANT
SOL PREP PROV IODINE SCRUB 4OZ (MISCELLANEOUS) ×3 IMPLANT
SPONGE DRAIN TRACH 4X4 STRL 2S (GAUZE/BANDAGES/DRESSINGS) IMPLANT
SPONGE GAUZE 4X4 12PLY (GAUZE/BANDAGES/DRESSINGS) ×3 IMPLANT
SPONGE LAP 18X18 X RAY DECT (DISPOSABLE) ×3 IMPLANT
STAPLER VISISTAT 35W (STAPLE) IMPLANT
STRIP CLOSURE SKIN 1/2X4 (GAUZE/BANDAGES/DRESSINGS) ×3 IMPLANT
SUT CHROMIC 0 CT 1 (SUTURE) ×45 IMPLANT
SUT CHROMIC 2 0 CT 1 (SUTURE) ×6 IMPLANT
SUT CHROMIC GUT BROWN 0 54 (SUTURE) IMPLANT
SUT CHROMIC GUT BROWN 0 54IN (SUTURE)
SUT ETHILON 3 0 FSL (SUTURE) ×3 IMPLANT
SUT PDS AB CT VIOLET #0 27IN (SUTURE) IMPLANT
SUT PLAIN CT 1/2CIR 2-0 27IN (SUTURE) ×15 IMPLANT
SUT PROLENE 0 CT 1 30 (SUTURE) ×6 IMPLANT
SUT VIC AB 0 CT1 27 (SUTURE)
SUT VIC AB 0 CT1 27XBRD ANTBC (SUTURE) IMPLANT
SUT VIC AB 2-0 CT1 27 (SUTURE)
SUT VIC AB 2-0 CT1 TAPERPNT 27 (SUTURE) IMPLANT
SUT VICRYL 0 UR6 27IN ABS (SUTURE) ×6 IMPLANT
SUT VICRYL 4 0 KS 27 (SUTURE) ×3 IMPLANT
TOWEL BLUE STERILE X RAY DET (MISCELLANEOUS) ×3 IMPLANT
TOWEL OR 17X26 4PK STRL BLUE (TOWEL DISPOSABLE) ×3 IMPLANT
TRAY FOLEY CATH 14FR (SET/KITS/TRAYS/PACK) ×3 IMPLANT

## 2011-09-10 NOTE — Anesthesia Preprocedure Evaluation (Addendum)
Anesthesia Evaluation  Patient identified by MRN, date of birth, ID band Patient awake    Reviewed: Allergy & Precautions, H&P , NPO status , Patient's Chart, lab work & pertinent test results  History of Anesthesia Complications Negative for: history of anesthetic complications  Airway Mallampati: I      Dental  (+) Teeth Intact   Pulmonary COPD COPD inhaler, Current Smoker,  clear to auscultation        Cardiovascular hypertension, Pt. on medications +CHF Regular Normal    Neuro/Psych    GI/Hepatic GERD-  Medicated and Controlled,  Endo/Other  Diabetes mellitus-, Well Controlled, Type 2, Oral Hypoglycemic Agents  Renal/GU      Musculoskeletal   Abdominal   Peds  Hematology   Anesthesia Other Findings   Reproductive/Obstetrics                           Anesthesia Physical Anesthesia Plan  ASA: III  Anesthesia Plan: General   Post-op Pain Management:    Induction: Intravenous, Rapid sequence and Cricoid pressure planned  Airway Management Planned: Oral ETT  Additional Equipment:   Intra-op Plan:   Post-operative Plan: Extubation in OR  Informed Consent: I have reviewed the patients History and Physical, chart, labs and discussed the procedure including the risks, benefits and alternatives for the proposed anesthesia with the patient or authorized representative who has indicated his/her understanding and acceptance.     Plan Discussed with:   Anesthesia Plan Comments:         Anesthesia Quick Evaluation

## 2011-09-10 NOTE — Op Note (Signed)
See full dictation included in brief of note for this abdominal hysterectomy with removal of cervix performed through a Pelosi incision

## 2011-09-10 NOTE — Interval H&P Note (Signed)
History and Physical Interval Note:  09/10/2011 9:37 AM  Robin [name]  has presented today for surgery, with the diagnosis of uterine fibroids  The various methods of treatment have been discussed with the patient and family. After consideration of risks, benefits and other options for treatment, the patient has consented to  Procedure(s): HYSTERECTOMY ABDOMINAL SCAR REVISION as a surgical intervention .  The patients' history has been reviewed, patient examined, no change in status, stable for surgery.  I have reviewed the patients' chart and labs.  Questions were answered to the patient's satisfaction.     Xzavior Reinig V  Patient interviewed, and procedure confirmed by patient.  Bowel prep completed. Plan for total abdominal hysterectomy and removal of cervix, with preservation of ovaries unless clinical suspicion of pathology at time of surgery.  Wide excision of old cicatrix to remove painful fibrosis of cicatrix planned.

## 2011-09-10 NOTE — Transfer of Care (Signed)
Immediate Anesthesia Transfer of Care Note  Patient: Robin [name]  Procedure(s) Performed:  HYSTERECTOMY ABDOMINAL - Abdominal hysterectomy; SCAR REVISION - Wide Excision of old Cicatrix  Patient Location: PACU  Anesthesia Type: General  Level of Consciousness: awake and patient cooperative  Airway & Oxygen Therapy: Patient Spontanous Breathing and Patient connected to face mask oxygen  Post-op Assessment: Report given to PACU RN, Post -op Vital signs reviewed and stable and Patient moving all extremities  Post vital signs: Reviewed and stable  Complications: No apparent anesthesia complications

## 2011-09-10 NOTE — Anesthesia Postprocedure Evaluation (Signed)
  Anesthesia Post-op Note  Patient: Robin [name]  Procedure(s) Performed:  HYSTERECTOMY ABDOMINAL - Abdominal hysterectomy; SCAR REVISION - Wide Excision of old Cicatrix  Patient Location: PACU  Anesthesia Type: General  Level of Consciousness: awake, alert , oriented and patient cooperative  Airway and Oxygen Therapy: Patient Spontanous Breathing  Post-op Pain: 4 /10, moderate  Post-op Assessment: Post-op Vital signs reviewed, Patient's Cardiovascular Status Stable, Respiratory Function Stable, Patent Airway and No signs of Nausea or vomiting  Post-op Vital Signs: Reviewed and stable  Complications: No apparent anesthesia complications

## 2011-09-10 NOTE — H&P (View-Only) (Signed)
Robin [name] is an 46 y.o. female. She is admitted for abdominal hysterectomy with preservation of tubes and ovaries and wide excision of old cicatrix to include scar tissue on the right corner of the incision she is undergoing hysterectomy due to fibroid enlargement of the uterus, and failed endometrial ablation which was performed December 2000 and the patient was dissatisfied with the discomfort of her menses which only last 3 days one of which includes clots and significant discomfort she has discomfort in her lower abdomen at the right corner of the incision where she had poor healing of her original surgical incision and had to have the incision heal by secondary intention. All recent Pap smears are normal most recently 08/13/2011 patient declines consideration of IUD and other treatment alternatives such as hormonal management are declined. Her ultrasounds have confirmed fibroids in the anterior wall of the uterus not deforming the endometrial cavity. GC and Chlamydia cultures are negative 09/04/2011 trichomonas vaginalis was treated as was used infection noted  Pertinent Gynecological History: Menses: flow is light and usually lasting 3 to For days Bleeding: Regular Contraception: tubal ligation DES exposure: denies Blood transfusions: none Sexually transmitted diseases: Trichomonas treated January 2013 Previous GYN Procedures: Endometrial ablation 2009 cesarean section x3 Last mammogram: Unknown Date:  Last pap: Normal with yeast and Trichomonas both treated Date: January 2013 OB History: G 3, P3   Menstrual History: Menarche age:  Patient's last menstrual period was 08/21/2011.    Past Medical History  Diagnosis Date  . Type 2 diabetes mellitus   . Essential hypertension, benign   . GERD (gastroesophageal reflux disease)   . COPD (chronic obstructive pulmonary disease)   . Arthritis   . Normal coronary arteries     3/10 - following abnormal Myoview  . Diabetes mellitus   .  CHF (congestive heart failure)     Past Surgical History  Procedure Date  . Tubal ligation   . Cholecystectomy   . Cesarean section     Family History  Problem Relation Age of Onset  . Cirrhosis Mother   . Diabetes type II Father   . Diabetes type II Sister   . Anesthesia problems Neg Hx   . Hypotension Neg Hx   . Malignant hyperthermia Neg Hx   . Pseudochol deficiency Neg Hx     Social History:  reports that she has been smoking Cigarettes.  She has a 7.25 pack-year smoking history. She has never used smokeless tobacco. She reports that she drinks about 6 ounces of alcohol per week. She reports that she does not use illicit drugs.  Allergies:  Allergies  Allergen Reactions  . Bee Venom Swelling    HAS HAD AN EPI PEN IN THE PAST     (Not in a hospital admission)  ROS  Last menstrual period 08/21/2011. Physical Exam Physical Examination: General appearance - alert, well appearing, and in no distress, oriented to Tomaszewski, place, and time and overweight Mental status - alert, oriented to Ugarte, place, and time, normal mood, behavior, speech, dress, motor activity, and thought processes Mouth - mucous membranes moist, pharynx normal without lesions and dental hygiene good Chest - clear to auscultation, no wheezes, rales or rhonchi, symmetric air entry Heart - normal rate and regular rhythm 2/6 systolic ejection murmur right upper sternal border Abdomen - soft, nontender, nondistended, no masses or organomegaly scars from previous incisions fibrotic Pfannenstiel incision with thickness in tenderness at the right corner of the incision Pelvic - VULVA: normal appearing vulva   with no masses, tenderness or lesions, VAGINA: normal appearing vagina with normal color and discharge, no lesions, adequate support, CERVIX: normal appearing cervix without discharge or lesions, UTERUS: uterus is normal size, shape, consistency and nontender, enlarged to 8-10 weeks size, irregular, mobile,  ADNEXA: normal adnexa in size, nontender and no masses,  Extremities - peripheral pulses normal, no pedal edema, no clubbing or cyanosis, Homan's sign negative bilaterally  No results found for this or any previous visit (from the past 24 hour(s)). CBC    Component Value Date/Time   WBC 8.1 09/04/2011 1140   RBC 4.72 09/04/2011 1140   HGB 14.2 09/04/2011 1140   HCT 42.3 09/04/2011 1140   PLT 258 09/04/2011 1140   MCV 89.6 09/04/2011 1140   MCH 30.1 09/04/2011 1140   MCHC 33.6 09/04/2011 1140   RDW 14.3 09/04/2011 1140   LYMPHSABS 2.4 06/07/2011 0757   MONOABS 0.7 06/07/2011 0757   EOSABS 0.1 06/07/2011 0757   BASOSABS 0.0 06/07/2011 0757    BMET    Component Value Date/Time   NA 136 09/04/2011 1140   K 4.4 09/04/2011 1140   CL 102 09/04/2011 1140   CO2 25 09/04/2011 1140   GLUCOSE 176* 09/04/2011 1140   BUN 18 09/04/2011 1140   CREATININE 0.91 09/04/2011 1140   CREATININE 0.81 07/11/2011 0725   CALCIUM 10.0 09/04/2011 1140   GFRNONAA 75* 09/04/2011 1140   GFRAA 87* 09/04/2011 1140   No results found.  Assessment/Pla11 n1 uterine fibroids 8-10 weeks size  2 dysmenorrhea  bdominal wall fibrosis with discomfort abdominal hysterectomy with preservation of tubes and ovaries if clinically possible a2s determined at the time of surgery  Risks of procedure including bleeding infection or injury to adjacent organs reviewed with patient bowel prep was performed for the day before surgery antibiotics administered.  Dravyn Severs V 09/09/2011, 6:17 PM  

## 2011-09-10 NOTE — Anesthesia Procedure Notes (Signed)
Procedure Name: Intubation Date/Time: 09/10/2011 10:22 AM Performed by: Despina Hidden Pre-anesthesia Checklist: Suction available, Emergency Drugs available, Patient being monitored and Patient identified Patient Re-evaluated:Patient Re-evaluated prior to inductionOxygen Delivery Method: Circle System Utilized Preoxygenation: Pre-oxygenation with 100% oxygen Intubation Type: IV induction Ventilation: Mask ventilation without difficulty Laryngoscope Size: Mac and 3 Grade View: Grade I Tube type: Oral Tube size: 7.0 mm Number of attempts: 1 Airway Equipment and Method: stylet Placement Confirmation: ETT inserted through vocal cords under direct vision,  positive ETCO2 and breath sounds checked- equal and bilateral Secured at: 22 cm Tube secured with: Tape Dental Injury: Teeth and Oropharynx as per pre-operative assessment

## 2011-09-10 NOTE — Plan of Care (Signed)
Problem: Phase I Progression Outcomes Goal: Pain controlled with appropriate interventions Outcome: Progressing Pt on pca

## 2011-09-10 NOTE — Brief Op Note (Signed)
09/10/2011  12:26 PM  PATIENT:  Robin [name]  46 y.o. female  PRE-OPERATIVE DIAGNOSIS:  uterine fibroids, dysmenorrhea  POST-OPERATIVE DIAGNOSIS:  uterine fibroids, dysmenorrhea, incision fibrosis  PROCEDURE:  Procedure(s): HYSTERECTOMY ABDOMINAL SCAR REVISION  SURGEON:  Surgeon(s): Tilda Burrow, MD  PHYSICIAN ASSISTANT:   ASSISTANTSMorrie Sheldon RN-FA   ANESTHESIA:   general  EBL:  Total I/O In: 2500 [I.V.:2500] Out: 425 [Urine:250; Blood:175]  BLOOD ADMINISTERED:none  DRAINS: Urinary Catheter (Foley) ,also sub-cutaneous J-P Drain  LOCAL MEDICATIONS USED:  NONE  SPECIMEN:  Source of Specimen:  1. uterus, and cervix.  2. old cicatrix, with rlq fibrosis  DISPOSITION OF SPECIMEN:  PATHOLOGY  COUNTS:  YES  TOURNIQUET:  * No tourniquets in log *  DICTATION: .Dragon Dictation  Patient was taken to the operating room prepped and draped for lower abdominal surgery, with Foley catheter in place and vaginal prepping performed. Timeout was conducted by surgical team. Antibiotics administered preoperatively. The old Pfannenstiel incision was excised with a wide area this skin surrounding it to allow for improved contouring due to the extensive fibrosis from prior cesarean scar had healed in by secondary intention. A Pelosi incision into the abdominal cavity was performed, midline entry with omentum identified attached to anterior peritoneal surfaces sharp dissection freed the omentum and Alexis wound retractor positioned with laparotomy tapes and uterus identifiable large fibroid was noted in front of the left round ligament. The round ligaments were taken down sharply 0 chromic suture ligature and utero-ovarian ligaments bilaterally clamped cut and suture ligated. Bladder flap was densely adherent to the anterior lower uterine segment and required careful sharp dissection to free it up from the fibrosis of the cesarean section line. With care sufficient mobilization can be performed  to identify a smooth cleavage plane in front of the lower uterine segment. Uterus then had the uterine vessels clamped cut and suture ligated using curved Heaney clamps and 0 chromic suture ligature and knife dissection upper cardinal ligaments were treated similarly. Cervix is amputated off the lower uterine segment for improved visibility. The second portion of the cardinal ligaments were clamped cut and suture ligated then a stab incision in the anterior cervicovaginal fornix allowed amputation of the cervix off of the vaginal cuff being careful to leave as much of the cervical ring of supportive tissue as possible. Coker clamps were positioned due to bleeding along this cuff and then Aldridge stitches placed at each lateral vaginal angle and the remainder of the cuff closed transversely. 0 chromic suture was used throughout this portion of the case. Pedicles were irrigated and inspected and confirmed as hemostatic. Laparotomy equipment removed. Anterior peritoneum closed loosely with 2-0 chromic, the fascia closed in the midline with PDS 0, and the transverse incision inspected,and a large fibrotic area attached to the fascia in the right lower quadrant was identified and sharply excised.The fascia was pulled together with PDS interrupted sutures in that area 20 plain was used to reapproximate subcutaneous tissues, a J-P drain placed in the deep subcutaneous space allowed to exit the skin in the right lower quadrant through a separate stab incision, and then subcuticular 4-0 Vicryl closure of skin incision completed the procedure after irrigation was again sponge and needle counts were correct and went to recovery in good condition urine remained clear throughout the case . Marland Kitchen  OF CARE: Admit to inpatient   PATIENT DISPOSITION:  PACU - hemodynamically stable.   Delay start of Pharmacological VTE agent (>24hrs) due to surgical blood loss or  risk of bleeding:  {YES/NO/NOT APPLICABLE:20182

## 2011-09-11 LAB — BASIC METABOLIC PANEL
BUN: 9 mg/dL (ref 6–23)
CO2: 26 mEq/L (ref 19–32)
Calcium: 9.2 mg/dL (ref 8.4–10.5)
Chloride: 101 mEq/L (ref 96–112)
Creatinine, Ser: 0.84 mg/dL (ref 0.50–1.10)
GFR calc Af Amer: >90 mL/min (ref >90)
GFR calc non Af Amer: 83 mL/min — ABNORMAL LOW (ref >90)
Glucose, Bld: 205 mg/dL — ABNORMAL HIGH (ref 70–99)
Potassium: 3.6 mEq/L (ref 3.5–5.1)
Sodium: 134 mEq/L — ABNORMAL LOW (ref 135–145)

## 2011-09-11 LAB — GLUCOSE, CAPILLARY
Glucose-Capillary: 170 mg/dL — ABNORMAL HIGH (ref 70–99)
Glucose-Capillary: 129 mg/dL — ABNORMAL HIGH (ref 70–99)
Glucose-Capillary: 138 mg/dL — ABNORMAL HIGH (ref 70–99)
Glucose-Capillary: 162 mg/dL — ABNORMAL HIGH (ref 70–99)

## 2011-09-11 LAB — CBC
HCT: 33.5 % — ABNORMAL LOW (ref 36.0–46.0)
Hemoglobin: 11.3 g/dL — ABNORMAL LOW (ref 12.0–15.0)
MCH: 30.6 pg (ref 26.0–34.0)
MCHC: 33.7 g/dL (ref 30.0–36.0)
MCV: 90.8 fL (ref 78.0–100.0)
Platelets: 218 10*3/uL (ref 150–400)
RBC: 3.69 MIL/uL — ABNORMAL LOW (ref 3.87–5.11)
RDW: 14 % (ref 11.5–15.5)
WBC: 9 10*3/uL (ref 4.0–10.5)

## 2011-09-11 MED ORDER — HYDROMORPHONE 0.3 MG/ML IV SOLN
INTRAVENOUS | Status: AC
  Administered 2011-09-11
  Filled 2011-09-11: qty 25

## 2011-09-11 MED ORDER — HYDROMORPHONE 0.3 MG/ML IV SOLN
INTRAVENOUS | Status: AC
  Filled 2011-09-11: qty 25

## 2011-09-11 MED ORDER — IPRATROPIUM-ALBUTEROL 18-103 MCG/ACT IN AERO
2.0000 | INHALATION_SPRAY | Freq: Four times a day (QID) | RESPIRATORY_TRACT | Status: AC
  Administered 2011-09-11 – 2011-09-12 (×4): 2 via RESPIRATORY_TRACT
  Filled 2011-09-11: qty 14.7

## 2011-09-11 MED ORDER — HYDROMORPHONE 0.3 MG/ML IV SOLN
INTRAVENOUS | Status: AC
  Administered 2011-09-11: 12:00:00
  Filled 2011-09-11: qty 25

## 2011-09-11 MED ORDER — METFORMIN HCL 500 MG PO TABS
1000.0000 mg | ORAL_TABLET | Freq: Two times a day (BID) | ORAL | Status: DC
  Administered 2011-09-11 (×2): 1000 mg via ORAL
  Filled 2011-09-11 (×2): qty 2

## 2011-09-11 NOTE — Progress Notes (Signed)
1 Day Post-Op  HYSTERECTOMY ABDOMINAL - Abdominal hysterectomy; SCAR REVISION - Wide Excision of old Cicatrix  Subjective: Patient reports incisional pain, tolerating PO and no problems voiding.  Foley to be d/c'd.  Pt had coughing episode this morning, productive for thick sputum. Hungry. Pain control adequate.  Objective: I have reviewed patient's vital signs, intake and output, medications and labs. CBC    Component Value Date/Time   WBC 9.0 09/11/2011 0446   RBC 3.69* 09/11/2011 0446   HGB 11.3* 09/11/2011 0446   HCT 33.5* 09/11/2011 0446   PLT 218 09/11/2011 0446   MCV 90.8 09/11/2011 0446   MCH 30.6 09/11/2011 0446   MCHC 33.7 09/11/2011 0446   RDW 14.0 09/11/2011 0446   LYMPHSABS 2.4 06/07/2011 0757   MONOABS 0.7 06/07/2011 0757   EOSABS 0.1 06/07/2011 0757   BASOSABS 0.0 06/07/2011 0757  aPreop hgb was 14.2   General: alert and no distress Resp: rhonchi bilaterally, initially that clear with cough. and no wheezing GI: soft, non-tender; bowel sounds normal; no masses,  no organomegaly and incision: dry, intact and drain fluids are clearing appropriately Extremities: Homans sign is negative, no sign of DVT  Assessment: s/p  HYSTERECTOMY ABDOMINAL - Abdominal hysterectomy; SCAR REVISION - Wide Excision of old Cicatrix: stable and mild congestion,  Diabetes , suboptimally controlled  Plan: Advance diet Encourage ambulation add albuterol inhaler qid today, add back Metformin. Probable discharge in a.m.  LOS: 1 day    Annette Liotta V 09/11/2011, 8:27 AM

## 2011-09-11 NOTE — Addendum Note (Signed)
Addendum  created 09/11/11 0756 by Despina Hidden, CRNA   Modules edited:Notes Section

## 2011-09-11 NOTE — Anesthesia Postprocedure Evaluation (Signed)
  Anesthesia Post-op Note  Patient: Robin [name]  Procedure(s) Performed:  HYSTERECTOMY ABDOMINAL - Abdominal hysterectomy; SCAR REVISION - Wide Excision of old Cicatrix  Patient Location: room 303  Anesthesia Type: General  Level of Consciousness: awake, alert , oriented and patient cooperative  Airway and Oxygen Therapy: Patient Spontanous Breathing  Post-op Pain: 5 /10, moderate  Post-op Assessment: Post-op Vital signs reviewed, Patient's Cardiovascular Status Stable, Respiratory Function Stable, Patent Airway, No signs of Nausea or vomiting and Adequate PO intake  Post-op Vital Signs: Reviewed and stable  Complications: No apparent anesthesia complications

## 2011-09-11 NOTE — Progress Notes (Signed)
09/11/11 1527 Patient ambulated in hall from her room to nurses station and back this afternoon. Tolerated fairly well, c/o some pain and dizziness toward end of ambulation. Assisted back to room, stated would like to sit up in chair, family at bedside. Encouraged to call for assistance, call light within reach. Denies any more productive cough this afternoon. Nursing to monitor.

## 2011-09-11 NOTE — Progress Notes (Signed)
09/11/11 1110 Foley catheter d/c'd at 0822 per order this morning. Pt states voided independently this morning with no problems, assisted up to chair this morning, tolerated activity well, c/o abdominal discomfort with coughing/activity. Encouraged to splint abdomen with pillow for coughing/deep breathing. Productive cough with small amt tan phlegm this morning, seen by Dr Emelda Fear, denies shortness of breath. Will monitor.

## 2011-09-12 ENCOUNTER — Encounter (HOSPITAL_COMMUNITY): Payer: Self-pay | Admitting: Obstetrics and Gynecology

## 2011-09-12 DIAGNOSIS — D219 Benign neoplasm of connective and other soft tissue, unspecified: Secondary | ICD-10-CM | POA: Diagnosis present

## 2011-09-12 LAB — GLUCOSE, CAPILLARY
Glucose-Capillary: 82 mg/dL (ref 70–99)
Glucose-Capillary: 95 mg/dL (ref 70–99)

## 2011-09-12 MED ORDER — BISACODYL 10 MG RE SUPP
10.0000 mg | Freq: Every day | RECTAL | Status: DC | PRN
  Administered 2011-09-12: 10 mg via RECTAL
  Filled 2011-09-12: qty 1

## 2011-09-12 MED ORDER — HYDROMORPHONE 0.3 MG/ML IV SOLN
INTRAVENOUS | Status: AC
  Filled 2011-09-12: qty 25

## 2011-09-12 MED ORDER — OXYCODONE-ACETAMINOPHEN 5-325 MG PO TABS: 1.0000 | ORAL_TABLET | ORAL | Status: AC | PRN

## 2011-09-12 MED ORDER — SODIUM CHLORIDE 0.9 % IJ SOLN
INTRAMUSCULAR | Status: AC
  Administered 2011-09-12: 10 mL
  Filled 2011-09-12: qty 3

## 2011-09-12 NOTE — Progress Notes (Signed)
09/12/11 1559 Patient discharged home this afternoon, IV site d/c'd within normal limits. Reviewed discharge instructions with patient via teach back method. Able to verbalize discharge instructions, stating back to nurse. Reviewed proper JP drain care, demonstrated correct care of drain. Denied dizziness at discharge, pain decreased with percocet 1 tablet as ordered PRN pain. Ambulates independently, instructed in incision care. Given discharge instructions, med list, prescription, f/u appointments. Pt left floor in stable condition via w/c accompanied by nurse tech.

## 2011-09-12 NOTE — Progress Notes (Signed)
Pt lightheaded after trip to bathroom. VSS. cbg 88. Will hold amaryl this am, and recheck middday. Discharge after lunch still anticipated.

## 2011-09-12 NOTE — Discharge Summary (Signed)
Physician Discharge Summary  Patient ID: Robin [name] MRN: [id number] DOB/AGE: 12/05/1965 46 y.o.  Admit date: 09/10/2011 Discharge date: 09/12/2011  Admission Diagnoses:uterine fibroids 10 weeks size, abdominal pain secondary to fibrosis diabetes mellitus type 2  Discharge Diagnoses: Same Active Problems:  * No active hospital problems. *    Discharged Condition: good  Hospital Course: This 46 year old female is admitted for abdominal hysterectomy and excision of fibrotic old cicatrix that had healed in by secondary intention after her last cesarean She underwent hysterectomy with EBL 175 cc, was Jackson-Pratt drain placed in the subcutaneous space she recovered in this report fashion over over 2 days and was discharged on the afternoon 09/12/2011. Pathology revealed uterine fibroids and a 181 g uterus  Consults: None  Significant Diagnostic Studies: labs: Hemoglobin & Hematocrit     Component Value Date/Time   HGB 11.3* 09/11/2011 0446   HCT 33.5* 09/11/2011 0446     Treatments: surgery: Abdominal hysterectomy wide excision of old cicatrix with placement and JP subcutaneous drain  Discharge Exam: Blood pressure 159/80, pulse 83, temperature 98.6 F (37 C), temperature source Axillary, resp. rate 18, height 5\' 7"  (1.702 m), weight 92.534 kg (204 lb), SpO2 100.00%. GI: normal findings: bowel sounds normal and abnormal findings:  Jackson-Pratt drain producing modest clear fluid and 25 cc per day  Disposition: Home or Self Care  Discharge Orders    Future Orders Please Complete By Expires   Diet - low sodium heart healthy      Increase activity slowly      Discharge instructions      Comments:   General Gynecological Post-Operative Instructions You may expect to feel dizzy, weak, and drowsy for as long as 24 hours after receiving the medicine that made you sleep (anesthetic). The following information pertains to your recovery period for the first 24 hours following surgery.    Do not drive a car, ride a bicycle, participate in physical activities, or take public transportation until you are done taking narcotic pain medicines or as directed by your caregiver.  Do not drink alcohol or take tranquilizers.  Do not take medicine that has not been prescribed by your caregiver.  Do not sign important papers or make important decisions while on narcotic pain medicines.  Have a responsible Plato with you.  CARE OF INCISION  Keep incision clean and dry. Take showers instead of baths until your caregiver gives you permission to take baths. Check with your caregiver if you have tubes coming from the wound site.  Avoid heavy lifting (more than 10 pounds/4.5 kilograms), pushing, or pulling.  Avoid activities that may risk injury to your surgical site.  Only take over-the-counter or prescription medicines for pain, discomfort, or fever as directed by your caregiver. Do not take aspirin. It can make you bleed. Take medicines (antibiotics) that kill germs as directed.  Call the office or go to the MAU if:  You feel sick to your stomach (nauseous).  You start to throw up (vomit).  You have trouble eating or drinking.  You have an oral temperature above 100.4.  You have constipation that is not helped by adjusting diet or increasing fluid intake. Pain medicines are a common cause of constipation.  SEEK IMMEDIATE MEDICAL CARE IF:  You have persistent dizziness.  You have difficulty breathing or a congested sounding (croupy) cough.  You have an oral temperature above 102.5, not controlled by medicine.  There is increasing pain or tenderness near or in the surgical site.  ExitCare Patient Information 2011 [place], [place]. PLEASE EMPTY THE BULB ON THE DRAIN DAILY AND KEEP A RECORD OF THE AMOUNT REMOVED   Driving Restrictions      Comments:   NONE X 2 WEEKS   Sexual Activity Restrictions      Comments:   NO SEX X 6 WEEKS.   Other Restrictions      Comments:   AVOID CONSTIPATION  BY USING STOOL SOFTENER DAILY.     Medication List  As of 09/12/2011  8:57 PM   STOP taking these medications         HYDROcodone-acetaminophen 7.5-750 MG per tablet         TAKE these medications         cholecalciferol 1000 UNITS tablet   Commonly known as: VITAMIN D   Take 1,000 Units by mouth daily.      COMBIVENT 18-103 MCG/ACT inhaler   Generic drug: albuterol-ipratropium   Inhale 2 puffs into the lungs every 4 (four) hours as needed. For shortness of breath      enalapril 20 MG tablet   Commonly known as: VASOTEC   Take 1 tablet (20 mg total) by mouth daily.      felodipine 10 MG 24 hr tablet   Commonly known as: PLENDIL   Take 10 mg by mouth daily.      glimepiride 4 MG tablet   Commonly known as: AMARYL   Take 4 mg by mouth daily before breakfast.      hydrochlorothiazide 25 MG tablet   Commonly known as: HYDRODIURIL   Take 25 mg by mouth daily.      metFORMIN 1000 MG tablet   Commonly known as: GLUCOPHAGE   Take 1,000 mg by mouth 2 (two) times daily with a meal.      omeprazole 20 MG capsule   Commonly known as: PRILOSEC   Take 20 mg by mouth daily.      oxyCODONE-acetaminophen 5-325 MG per tablet   Commonly known as: PERCOCET   Take 1-2 tablets by mouth every 3 (three) hours as needed (moderate to severe pain (when tolerating fluids)).           Follow-up Information    Follow up with Tilda Burrow, MD in 4 days. (MONDAY AT 9 AM OR EARLIER  as needed if symptoms worsen)    Contact information:   [facility] [address] [place] [address] [phone]          Signed: Tilda Burrow 09/12/2011, 8:57 PM

## 2011-09-12 NOTE — Progress Notes (Signed)
09/12/11 1610 Patient became dizzy and lightheaded after going to bathroom and assisted to chair. Assisted back to bed, responsive, alert and oriented x 4, became very weak and eyes rolling. Responsive through episode, lasted few seconds. CBG 82 this am, pt stated not usually low at home. Amaryl and metformin held this am, ate only bacon and few eggs with breakfast,given orange juice.  Dr Emelda Fear notified this am. Dilaudid PCA d/c'd per Dr Emelda Fear. Patient stated felt better after few seconds, O2 sats 100% on r/a, VSS. Nursing to monitor.

## 2011-09-12 NOTE — Progress Notes (Signed)
09/12/11 1216 Patient up in chair and ambulates in room frequently. Ambulated in hallway from her room to opposite end of hall past nurses station this morning, tolerated well. C/o some abdominal discomfort with activity. Denied dizziness before, during, after ambulation. Assisted back to chair, encouraged to call.

## 2011-09-22 ENCOUNTER — Encounter (HOSPITAL_COMMUNITY): Payer: Self-pay

## 2011-09-22 ENCOUNTER — Emergency Department (HOSPITAL_COMMUNITY)
Admission: EM | Admit: 2011-09-22 | Discharge: 2011-09-22 | Disposition: A | Discharge status: Home / Self Care | Payer: Medicaid Other | Source: Home / Self Care | Attending: Family Medicine | Admitting: Family Medicine

## 2011-09-22 DIAGNOSIS — T8130XA Disruption of wound, unspecified, initial encounter: Secondary | ICD-10-CM

## 2011-09-22 DIAGNOSIS — T8131XA Disruption of external operation (surgical) wound, not elsewhere classified, initial encounter: Secondary | ICD-10-CM

## 2011-09-22 NOTE — ED Notes (Signed)
Pt states she had hysterectomy on 2-5 and had the drains removed on 2-11 and today the center of the suture line started to drain.  Dr Emelda Fear is her MD at [facility].  She didn't contact him.

## 2011-09-22 NOTE — ED Provider Notes (Signed)
History     CSN: [id number]  Arrival date & time 09/22/11  1422   First MD Initiated Contact with Patient 09/22/11 1523      Chief Complaint  Patient presents with  . Wound Check    (Consider location/radiation/quality/duration/timing/severity/associated sxs/prior treatment) HPI Comments: Robin [name] presents for evaluation of drainage from a wound in her lower abdomen. She recently had an open hysterectomy on February 5. She had drains removed from her abdomen on February 11. Today she noticed drainage from the suture line. She denies any fever or tenderness.  Patient is a 46 y.o. female presenting with wound check. The history is provided by the patient.  Wound Check  She was treated in the ED today. There has been no treatment since the wound repair. There has been clear discharge from the wound. There is no redness present. There is no swelling present. The pain has no pain.    Past Medical History  Diagnosis Date  . Type 2 diabetes mellitus   . Essential hypertension, benign   . GERD (gastroesophageal reflux disease)   . COPD (chronic obstructive pulmonary disease)   . Arthritis   . Normal coronary arteries     3/10 - following abnormal Myoview  . Diabetes mellitus   . CHF (congestive heart failure)     Past Surgical History  Procedure Date  . Tubal ligation   . Cholecystectomy   . Cesarean section   . Abdominal hysterectomy 09/10/2011    Procedure: HYSTERECTOMY ABDOMINAL;  Surgeon: Tilda Burrow, MD;  Location: [facility];  Service: Gynecology;  Laterality: N/A;  Abdominal hysterectomy  . Scar revision 09/10/2011    Procedure: SCAR REVISION;  Surgeon: Tilda Burrow, MD;  Location: [facility];  Service: Gynecology;  Laterality: N/A;  Wide Excision of old Cicatrix    Family History  Problem Relation Age of Onset  . Cirrhosis Mother   . Diabetes type II Father   . Diabetes type II Sister   . Anesthesia problems Neg Hx   . Hypotension Neg Hx   . Malignant hyperthermia Neg Hx    . Pseudochol deficiency Neg Hx     History  Substance Use Topics  . Smoking status: Current Everyday Smoker -- 0.2 packs/day for 29 years    Types: Cigarettes  . Smokeless tobacco: Never Used   Comment: 1 cigarette a day  . Alcohol Use: 6.0 oz/week    10 Cans of beer per week     4-5 beers on friday night and 4-5 beers on saturday night    OB History    Grav Para Term Preterm Abortions TAB SAB Ect Mult Living                  Review of Systems  Constitutional: Negative.   HENT: Negative.   Eyes: Negative.   Respiratory: Negative.   Cardiovascular: Negative.   Gastrointestinal: Negative.   Genitourinary: Negative.   Musculoskeletal: Negative.   Skin: Positive for wound.  Neurological: Negative.     Allergies  Bee venom  Home Medications   Current Outpatient Rx  Name Route Sig Dispense Refill  . IPRATROPIUM-ALBUTEROL 18-103 MCG/ACT IN AERO Inhalation Inhale 2 puffs into the lungs every 4 (four) hours as needed. For shortness of breath    . VITAMIN D 1000 UNITS PO TABS Oral Take 1,000 Units by mouth daily.      . ENALAPRIL MALEATE 20 MG PO TABS Oral Take 1 tablet (20 mg total) by mouth daily. 30  tablet 0  . FELODIPINE ER 10 MG PO TB24 Oral Take 10 mg by mouth daily.      Marland Kitchen GLIMEPIRIDE 4 MG PO TABS Oral Take 4 mg by mouth daily before breakfast.      . HYDROCHLOROTHIAZIDE 25 MG PO TABS Oral Take 25 mg by mouth daily.      Marland Kitchen METFORMIN HCL 1000 MG PO TABS Oral Take 1,000 mg by mouth 2 (two) times daily with a meal.      . OMEPRAZOLE 20 MG PO CPDR Oral Take 20 mg by mouth daily.      . OXYCODONE-ACETAMINOPHEN 5-325 MG PO TABS Oral Take 1-2 tablets by mouth every 3 (three) hours as needed (moderate to severe pain (when tolerating fluids)). 30 tablet 0    BP 133/87  Pulse 84  Temp(Src) 98.1 F (36.7 C) (Oral)  Resp 22  SpO2 100%  LMP 08/21/2011  Physical Exam  Nursing note and vitals reviewed. Constitutional: She is oriented to [place], place, and time. She  appears well-developed and well-nourished.  HENT:  Head: Normocephalic and atraumatic.  Eyes: EOM are normal.  Neck: Normal range of motion.  Pulmonary/Chest: Effort normal.  Abdominal: Soft. Bowel sounds are normal. There is no tenderness.         Horizontal incision across lower abdomen; mild drainage from partial dehiscence; no erythema, mild induration  Musculoskeletal: Normal range of motion.  Neurological: She is alert and oriented to [place], place, and time.  Skin: Skin is warm and dry.       Horizontal incision across lower abdomen; mild drainage from partial dehiscence; no erythema, mild induration  Psychiatric: Her behavior is normal.    ED Course  Procedures (including critical care time)  Labs Reviewed - No data to display No results found.   1. Wound dehiscence       MDM  Wound dressed; no evidence of infection; will follow up with surgeon tomorrow        Richardo Priest, MD 09/22/11 1555

## 2011-09-22 NOTE — Discharge Instructions (Signed)
Your wound has partially come open. This can sometimes happen following an operation. It does not appear to be infected today. Please followup with your surgeon at the next available appointment. Keep the wound clean, dry, and covered. Return to care immediately should he experience any fevers, increased pain or worsening drainage from the wound.

## 2011-12-16 ENCOUNTER — Emergency Department (HOSPITAL_COMMUNITY)
Admission: EM | Admit: 2011-12-16 | Discharge: 2011-12-16 | Disposition: A | Discharge status: Home / Self Care | Payer: No Typology Code available for payment source | Attending: Emergency Medicine | Admitting: Emergency Medicine

## 2011-12-16 ENCOUNTER — Encounter (HOSPITAL_COMMUNITY): Payer: Self-pay | Admitting: Emergency Medicine

## 2011-12-16 ENCOUNTER — Other Ambulatory Visit (HOSPITAL_COMMUNITY): Payer: Self-pay | Admitting: Pharmacy Technician

## 2011-12-16 DIAGNOSIS — Z79899 Other long term (current) drug therapy: Secondary | ICD-10-CM | POA: Insufficient documentation

## 2011-12-16 DIAGNOSIS — I509 Heart failure, unspecified: Secondary | ICD-10-CM | POA: Insufficient documentation

## 2011-12-16 DIAGNOSIS — J4489 Other specified chronic obstructive pulmonary disease: Secondary | ICD-10-CM | POA: Insufficient documentation

## 2011-12-16 DIAGNOSIS — E119 Type 2 diabetes mellitus without complications: Secondary | ICD-10-CM | POA: Insufficient documentation

## 2011-12-16 DIAGNOSIS — Y92410 Unspecified street and highway as the place of occurrence of the external cause: Secondary | ICD-10-CM | POA: Insufficient documentation

## 2011-12-16 DIAGNOSIS — I10 Essential (primary) hypertension: Secondary | ICD-10-CM | POA: Insufficient documentation

## 2011-12-16 DIAGNOSIS — M549 Dorsalgia, unspecified: Secondary | ICD-10-CM | POA: Insufficient documentation

## 2011-12-16 DIAGNOSIS — K219 Gastro-esophageal reflux disease without esophagitis: Secondary | ICD-10-CM | POA: Insufficient documentation

## 2011-12-16 DIAGNOSIS — J449 Chronic obstructive pulmonary disease, unspecified: Secondary | ICD-10-CM | POA: Insufficient documentation

## 2011-12-16 LAB — URINALYSIS, ROUTINE W REFLEX MICROSCOPIC
Glucose, UA: 500 mg/dL — AB
Hgb urine dipstick: NEGATIVE
Ketones, ur: NEGATIVE mg/dL
Leukocytes, UA: NEGATIVE
Nitrite: POSITIVE — AB
Protein, ur: NEGATIVE mg/dL
Specific Gravity, Urine: 1.034 — ABNORMAL HIGH (ref 1.005–1.030)
Urobilinogen, UA: 1 mg/dL (ref 0.0–1.0)
pH: 6 (ref 5.0–8.0)

## 2011-12-16 LAB — URINE MICROSCOPIC-ADD ON

## 2011-12-16 LAB — GLUCOSE, CAPILLARY: Glucose-Capillary: 185 mg/dL — ABNORMAL HIGH (ref 70–99)

## 2011-12-16 MED ORDER — MORPHINE SULFATE 4 MG/ML IJ SOLN
4.0000 mg | Freq: Once | INTRAMUSCULAR | Status: AC
  Administered 2011-12-16: 4 mg via INTRAMUSCULAR
  Filled 2011-12-16: qty 1

## 2011-12-16 MED ORDER — OXYCODONE-ACETAMINOPHEN 5-325 MG PO TABS: 2.0000 | ORAL_TABLET | ORAL | Status: AC | PRN

## 2011-12-16 MED ORDER — ONDANSETRON 4 MG PO TBDP
4.0000 mg | ORAL_TABLET | Freq: Once | ORAL | Status: AC
  Administered 2011-12-16: 4 mg via ORAL
  Filled 2011-12-16: qty 1

## 2011-12-16 NOTE — Discharge Instructions (Signed)
Your urine does not show any signs of injury.  He did not have any evidence of a broken bone or dislocation.  Use ibuprofen 600 mg every 6 hours for pain.  Percocet for more severe pain.  Followup with your Dr. if your symptoms.  Last more than 3-4 days.  Return for worse or uncontrolled symptoms
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
## 2012-02-18 ENCOUNTER — Emergency Department (HOSPITAL_COMMUNITY): Payer: No Typology Code available for payment source

## 2012-02-18 ENCOUNTER — Encounter (HOSPITAL_COMMUNITY): Payer: Self-pay

## 2012-02-18 ENCOUNTER — Emergency Department (HOSPITAL_COMMUNITY)
Admission: EM | Admit: 2012-02-18 | Discharge: 2012-02-18 | Disposition: A | Discharge status: Home / Self Care | Payer: No Typology Code available for payment source | Attending: Emergency Medicine | Admitting: Emergency Medicine

## 2012-02-18 DIAGNOSIS — F17200 Nicotine dependence, unspecified, uncomplicated: Secondary | ICD-10-CM | POA: Insufficient documentation

## 2012-02-18 DIAGNOSIS — I509 Heart failure, unspecified: Secondary | ICD-10-CM | POA: Insufficient documentation

## 2012-02-18 DIAGNOSIS — R0602 Shortness of breath: Secondary | ICD-10-CM | POA: Insufficient documentation

## 2012-02-18 DIAGNOSIS — J449 Chronic obstructive pulmonary disease, unspecified: Secondary | ICD-10-CM | POA: Insufficient documentation

## 2012-02-18 DIAGNOSIS — I10 Essential (primary) hypertension: Secondary | ICD-10-CM | POA: Insufficient documentation

## 2012-02-18 DIAGNOSIS — K219 Gastro-esophageal reflux disease without esophagitis: Secondary | ICD-10-CM | POA: Insufficient documentation

## 2012-02-18 DIAGNOSIS — Z79899 Other long term (current) drug therapy: Secondary | ICD-10-CM | POA: Insufficient documentation

## 2012-02-18 DIAGNOSIS — E119 Type 2 diabetes mellitus without complications: Secondary | ICD-10-CM | POA: Insufficient documentation

## 2012-02-18 DIAGNOSIS — J4489 Other specified chronic obstructive pulmonary disease: Secondary | ICD-10-CM | POA: Insufficient documentation

## 2012-02-18 DIAGNOSIS — R079 Chest pain, unspecified: Secondary | ICD-10-CM

## 2012-02-18 LAB — CBC WITH DIFFERENTIAL/PLATELET
Basophils Absolute: 0 10*3/uL (ref 0.0–0.1)
Basophils Relative: 0 % (ref 0–1)
Eosinophils Absolute: 0.2 10*3/uL (ref 0.0–0.7)
Eosinophils Relative: 2 % (ref 0–5)
HCT: 40.5 % (ref 36.0–46.0)
Hemoglobin: 13.8 g/dL (ref 12.0–15.0)
Lymphocytes Relative: 35 % (ref 12–46)
Lymphs Abs: 2.5 10*3/uL (ref 0.7–4.0)
MCH: 30.1 pg (ref 26.0–34.0)
MCHC: 34.1 g/dL (ref 30.0–36.0)
MCV: 88.4 fL (ref 78.0–100.0)
Monocytes Absolute: 0.5 10*3/uL (ref 0.1–1.0)
Monocytes Relative: 8 % (ref 3–12)
Neutro Abs: 3.9 10*3/uL (ref 1.7–7.7)
Neutrophils Relative %: 55 % (ref 43–77)
Platelets: 232 10*3/uL (ref 150–400)
RBC: 4.58 MIL/uL (ref 3.87–5.11)
RDW: 12.9 % (ref 11.5–15.5)
WBC: 7.1 10*3/uL (ref 4.0–10.5)

## 2012-02-18 LAB — BASIC METABOLIC PANEL
BUN: 13 mg/dL (ref 6–23)
CO2: 24 mEq/L (ref 19–32)
Calcium: 9.4 mg/dL (ref 8.4–10.5)
Chloride: 98 mEq/L (ref 96–112)
Creatinine, Ser: 0.62 mg/dL (ref 0.50–1.10)
GFR calc Af Amer: >90 mL/min (ref >90)
GFR calc non Af Amer: >90 mL/min (ref >90)
Glucose, Bld: 400 mg/dL — ABNORMAL HIGH (ref 70–99)
Potassium: 3.8 mEq/L (ref 3.5–5.1)
Sodium: 134 mEq/L — ABNORMAL LOW (ref 135–145)

## 2012-02-18 LAB — HEPATIC FUNCTION PANEL
ALT: 14 U/L (ref 0–35)
AST: 15 U/L (ref 0–37)
Albumin: 3.4 g/dL — ABNORMAL LOW (ref 3.5–5.2)
Alkaline Phosphatase: 89 U/L (ref 39–117)
Bilirubin, Direct: <0.1 mg/dL (ref 0.0–0.3)
Total Bilirubin: 0.3 mg/dL (ref 0.3–1.2)
Total Protein: 6.4 g/dL (ref 6.0–8.3)

## 2012-02-18 LAB — D-DIMER, QUANTITATIVE (NOT AT ARMC): D-Dimer, Quant: <0.22 ug/mL-FEU (ref 0.00–0.48)

## 2012-02-18 LAB — TROPONIN I: Troponin I: <0.3 ng/mL (ref <0.30)

## 2012-02-18 IMAGING — CR DG CHEST 1V PORT
1 series · 1 of 1 positions shown · non-contrast
Comparison: [DATE]

CLINICAL DATA: Chest pain.  Shortness of breath.

PORTABLE CHEST - 1 VIEW

[view not recorded]
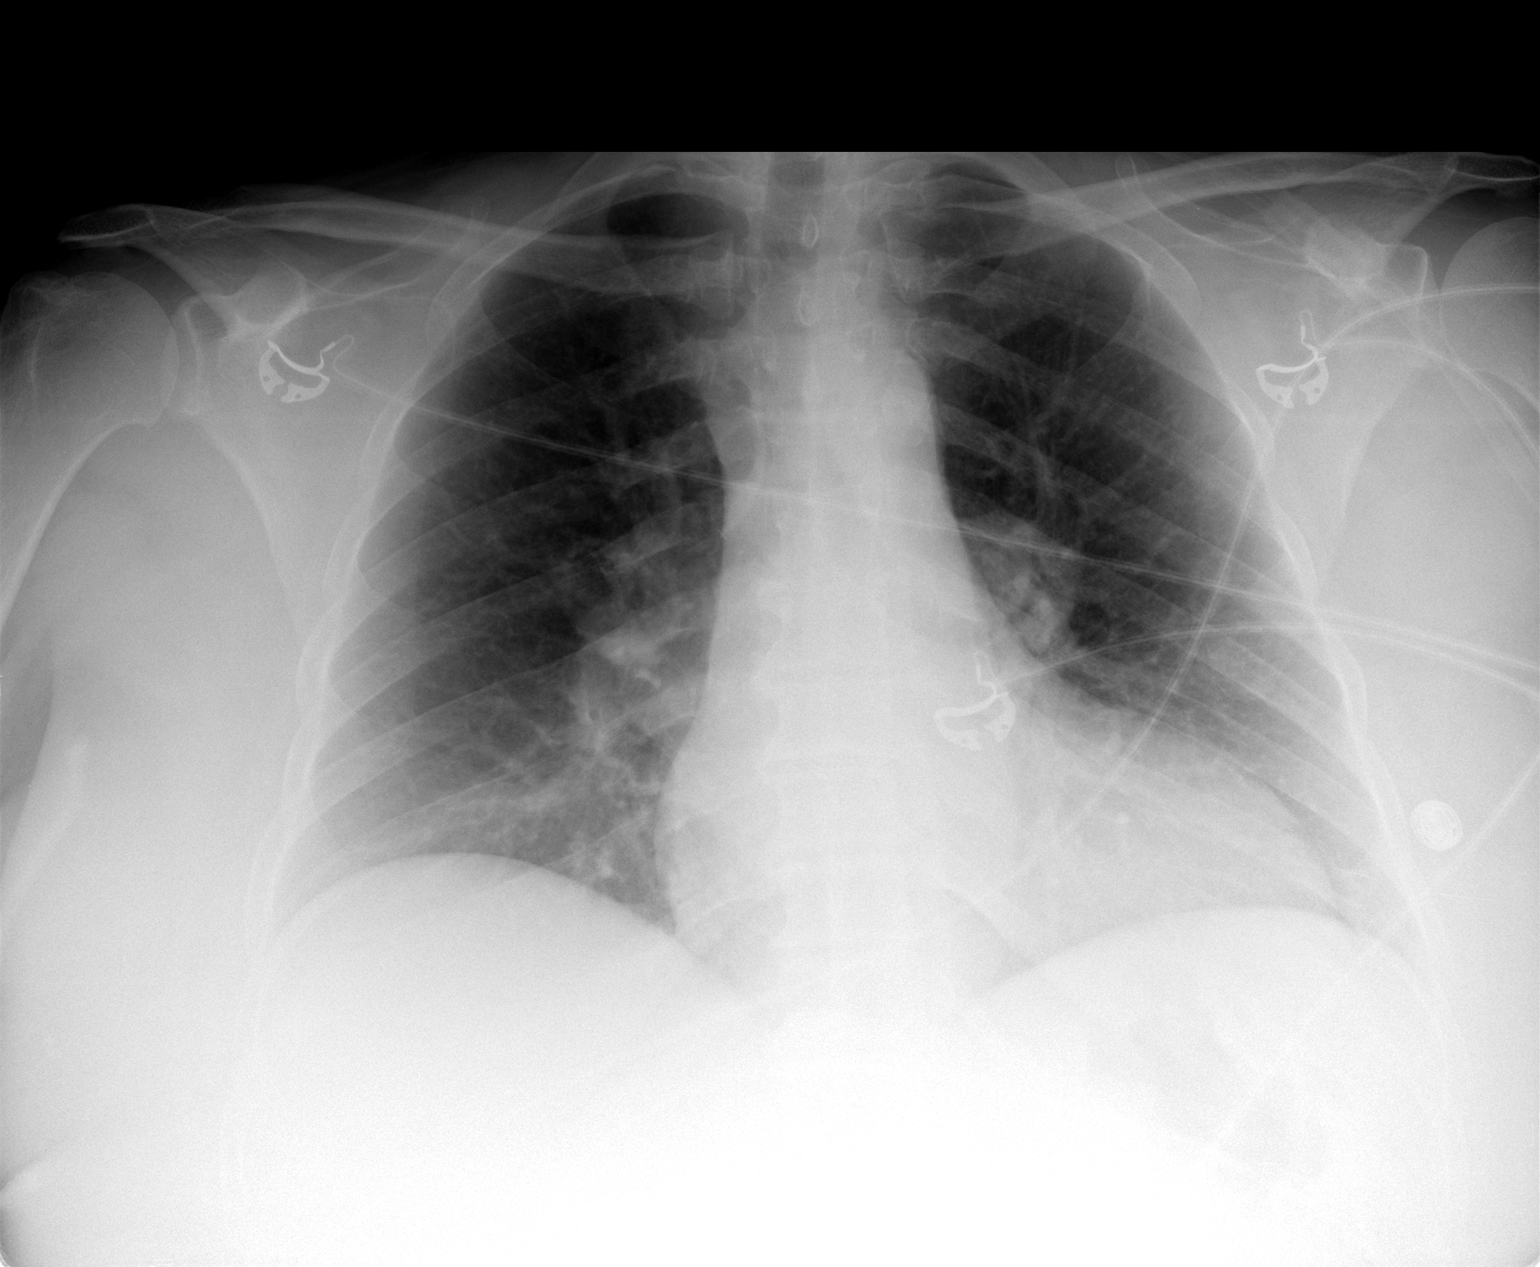

[1 of 1 positions shown; findings below may reference images not displayed]

FINDINGS: Heart size is at the upper limits of normal.  Pulmonary
arteries are normal.  Lungs are clear.  No acute osseous
abnormality.
IMPRESSION: No acute abnormalities.

## 2012-02-18 MED ORDER — MORPHINE SULFATE 4 MG/ML IJ SOLN
4.0000 mg | Freq: Once | INTRAMUSCULAR | Status: AC
  Administered 2012-02-18: 4 mg via INTRAVENOUS
  Filled 2012-02-18: qty 1

## 2012-02-18 MED ORDER — SODIUM CHLORIDE 0.9 % IV SOLN
Freq: Once | INTRAVENOUS | Status: AC
  Administered 2012-02-18: 09:00:00 via INTRAVENOUS

## 2012-02-18 NOTE — ED Notes (Signed)
Pt c/o chest pain sudden onset this am with radiation down her left arm. C/o shortness of breath and productive cough with white phlegm. Pt on cardiac monitor showing NSR. Family at bedside.

## 2012-02-18 NOTE — ED Provider Notes (Signed)
History    This chart was scribed for Benny Lennert, MD, MD by Smitty Pluck. The patient was seen in room [facility] and the patient's care was started at 8:51AM.   CSN: [id number]  Arrival date & time 02/18/12  4098   First MD Initiated Contact with Patient 02/18/12 [phone]      Chief Complaint  Patient presents with  . Chest Pain    (Consider location/radiation/quality/duration/timing/severity/associated sxs/prior treatment) Patient is a 46 y.o. female presenting with chest pain. The history is provided by the patient.  Chest Pain The chest pain began 1 - 2 hours ago. Duration of episode(s) is 15 minutes. Chest pain occurs constantly. The chest pain is unchanged. The pain is associated with eating. The pain radiates to the left arm. Chest pain is worsened by certain positions.  Her past medical history is significant for CHF.    Eleana Tocco Veldhuizen is a 46 y.o. female who presents to the Emergency Department complaining of moderate sharp chest pain onset today. Pt reports the pain lasted 15 minutes. Pain radiated to left arm. Pain is aggravated by laying down. Pt reports pain started after eating breakfast. Reports similar pain in past and she had CHF. She had catheterization 3-4 years. Pt reports having mild SOB.  PCP is Dr. Milford Cage Cardiologist Baird Lyons  Past Medical History  Diagnosis Date  . Type 2 diabetes mellitus   . Essential hypertension, benign   . GERD (gastroesophageal reflux disease)   . COPD (chronic obstructive pulmonary disease)   . Arthritis   . Normal coronary arteries     3/10 - following abnormal Myoview  . Diabetes mellitus   . CHF (congestive heart failure)     Past Surgical History  Procedure Date  . Tubal ligation   . Cholecystectomy   . Cesarean section   . Abdominal hysterectomy 09/10/2011    Procedure: HYSTERECTOMY ABDOMINAL;  Surgeon: Tilda Burrow, MD;  Location: [facility];  Service: Gynecology;  Laterality: N/A;  Abdominal hysterectomy  . Scar revision  09/10/2011    Procedure: SCAR REVISION;  Surgeon: Tilda Burrow, MD;  Location: [facility];  Service: Gynecology;  Laterality: N/A;  Wide Excision of old Cicatrix    Family History  Problem Relation Age of Onset  . Cirrhosis Mother   . Diabetes type II Father   . Diabetes type II Sister   . Anesthesia problems Neg Hx   . Hypotension Neg Hx   . Malignant hyperthermia Neg Hx   . Pseudochol deficiency Neg Hx     History  Substance Use Topics  . Smoking status: Current Everyday Smoker -- 0.2 packs/day for 29 years    Types: Cigarettes  . Smokeless tobacco: Never Used   Comment: 1 cigarette a day  . Alcohol Use: 6.0 oz/week    10 Cans of beer per week     4-5 beers on friday night and 4-5 beers on saturday night    OB History    Grav Para Term Preterm Abortions TAB SAB Ect Mult Living                  Review of Systems  Cardiovascular: Positive for chest pain.  All other systems reviewed and are negative.   10 Systems reviewed and all are negative for acute change except as noted in the HPI.   Allergies  Bee venom  Home Medications   Current Outpatient Rx  Name Route Sig Dispense Refill  . IPRATROPIUM-ALBUTEROL 18-103 MCG/ACT IN AERO  Inhalation Inhale 2 puffs into the lungs every 4 (four) hours as needed. For shortness of breath    . VITAMIN D 1000 UNITS PO TABS Oral Take 1,000 Units by mouth daily.      . ENALAPRIL MALEATE 20 MG PO TABS Oral Take 1 tablet (20 mg total) by mouth daily. 30 tablet 0  . FELODIPINE ER 10 MG PO TB24 Oral Take 10 mg by mouth daily.      Marland Kitchen GLIMEPIRIDE 4 MG PO TABS Oral Take 4 mg by mouth daily before breakfast.      . HYDROCHLOROTHIAZIDE 25 MG PO TABS Oral Take 25 mg by mouth daily.      Marland Kitchen HYDROCODONE-ACETAMINOPHEN 7.5-325 MG PO TABS Oral Take 1 tablet by mouth every 6 (six) hours as needed. For arthritis knee pain relief    . METFORMIN HCL 1000 MG PO TABS Oral Take 1,000 mg by mouth 2 (two) times daily with a meal.      . OMEPRAZOLE 20 MG PO  CPDR Oral Take 20 mg by mouth daily.        BP 165/90  Pulse 88  Temp 97.9 F (36.6 C) (Oral)  Resp 28  Ht 5\' 6"  (1.676 m)  Wt 220 lb (99.791 kg)  BMI 35.51 kg/m2  SpO2 100%  LMP 08/21/2011  Physical Exam  Nursing note and vitals reviewed. Constitutional: She is oriented to [place], place, and time. She appears well-developed.  HENT:  Head: Normocephalic and atraumatic.  Eyes: Conjunctivae and EOM are normal. No scleral icterus.  Neck: Neck supple. No thyromegaly present.  Cardiovascular: Normal rate and regular rhythm.  Exam reveals no gallop and no friction rub.   No murmur heard. Pulmonary/Chest: No stridor. She has no wheezes. She has no rales. She exhibits tenderness (anterior ).  Abdominal: She exhibits no distension. There is no tenderness. There is no rebound.  Musculoskeletal: Normal range of motion. She exhibits no edema.  Lymphadenopathy:    She has no cervical adenopathy.  Neurological: She is oriented to [place], place, and time. Coordination normal.  Skin: No rash noted. No erythema.  Psychiatric: She has a normal mood and affect. Her behavior is normal.       anxious    ED Course  Procedures (including critical care time) DIAGNOSTIC STUDIES: Oxygen Saturation is 100% on room air, normal by my interpretation.    COORDINATION OF CARE: 8:57AM EDP discusses pt ED treatment with pt  9:00AM EDP ordered medication: 0.9% NaCl infusion, morphine 4 mg 10:04AM EDP rechecks pt and discusses pt lab and imaging results. EDP recommends admission but pt prefers to be discharged.   Labs Reviewed  BASIC METABOLIC PANEL - Abnormal; Notable for the following:    Sodium 134 (*)     Glucose, Bld 400 (*)     All other components within normal limits  HEPATIC FUNCTION PANEL - Abnormal; Notable for the following:    Albumin 3.4 (*)     All other components within normal limits  CBC WITH DIFFERENTIAL  TROPONIN I  D-DIMER, QUANTITATIVE   Dg Chest Portable 1 View  02/18/2012   *RADIOLOGY REPORT*  Clinical Data: Chest pain.  Shortness of breath.  PORTABLE CHEST - 1 VIEW  Comparison: 07/11/2011  Findings: Heart size is at the upper limits of normal.  Pulmonary arteries are normal.  Lungs are clear.  No acute osseous abnormality.  IMPRESSION: No acute abnormalities.  Original Report Authenticated By: Gwynn Burly, M.D.     No diagnosis  found. Pt refused admission.  She stated she would follow up with her cardiologist  Date: 02/18/2012  Rate 84  Rhythm: normal sinus rhythm  QRS Axis: normal  Intervals: normal  ST/T Wave abnormalities: normal  Conduction Disutrbances:none  Narrative Interpretation:   Old EKG Reviewed: unchanged    MDM     The chart was scribed for me under my direct supervision.  I personally performed the history, physical, and medical decision making and all procedures in the evaluation of this patient.Benny Lennert, MD 02/18/12 1013

## 2012-02-18 NOTE — ED Notes (Signed)
Pt states she feels better.

## 2012-02-18 NOTE — ED Notes (Signed)
Pt states that her pain has eased up a lot. Resting comfortably on stretcher. Cardiac monitor still showing NSR.

## 2012-02-18 NOTE — ED Notes (Signed)
Pt c/o sharp left sided chest pain radiating down left arm.  Reports started after eating a bowl of cereal.  Denies any n/v, reports some SOB.

## 2012-06-19 ENCOUNTER — Encounter (HOSPITAL_COMMUNITY): Payer: Self-pay

## 2012-06-19 ENCOUNTER — Emergency Department (HOSPITAL_COMMUNITY)
Admission: EM | Admit: 2012-06-19 | Discharge: 2012-06-19 | Disposition: A | Discharge status: Home / Self Care | Payer: Self-pay | Attending: Emergency Medicine | Admitting: Emergency Medicine

## 2012-06-19 ENCOUNTER — Emergency Department (HOSPITAL_COMMUNITY): Payer: Self-pay

## 2012-06-19 DIAGNOSIS — R0789 Other chest pain: Secondary | ICD-10-CM

## 2012-06-19 DIAGNOSIS — R071 Chest pain on breathing: Secondary | ICD-10-CM | POA: Insufficient documentation

## 2012-06-19 DIAGNOSIS — J449 Chronic obstructive pulmonary disease, unspecified: Secondary | ICD-10-CM | POA: Insufficient documentation

## 2012-06-19 DIAGNOSIS — I509 Heart failure, unspecified: Secondary | ICD-10-CM | POA: Insufficient documentation

## 2012-06-19 DIAGNOSIS — F17200 Nicotine dependence, unspecified, uncomplicated: Secondary | ICD-10-CM | POA: Insufficient documentation

## 2012-06-19 DIAGNOSIS — R05 Cough: Secondary | ICD-10-CM | POA: Insufficient documentation

## 2012-06-19 DIAGNOSIS — M940 Chondrocostal junction syndrome [Tietze]: Secondary | ICD-10-CM | POA: Insufficient documentation

## 2012-06-19 DIAGNOSIS — E119 Type 2 diabetes mellitus without complications: Secondary | ICD-10-CM | POA: Insufficient documentation

## 2012-06-19 DIAGNOSIS — R059 Cough, unspecified: Secondary | ICD-10-CM | POA: Insufficient documentation

## 2012-06-19 DIAGNOSIS — I10 Essential (primary) hypertension: Secondary | ICD-10-CM | POA: Insufficient documentation

## 2012-06-19 DIAGNOSIS — K219 Gastro-esophageal reflux disease without esophagitis: Secondary | ICD-10-CM | POA: Insufficient documentation

## 2012-06-19 DIAGNOSIS — M129 Arthropathy, unspecified: Secondary | ICD-10-CM | POA: Insufficient documentation

## 2012-06-19 DIAGNOSIS — J4489 Other specified chronic obstructive pulmonary disease: Secondary | ICD-10-CM | POA: Insufficient documentation

## 2012-06-19 LAB — CBC WITH DIFFERENTIAL/PLATELET
Basophils Absolute: 0 10*3/uL (ref 0.0–0.1)
Basophils Relative: 0 % (ref 0–1)
Eosinophils Absolute: 0.3 10*3/uL (ref 0.0–0.7)
Eosinophils Relative: 5 % (ref 0–5)
HCT: 40 % (ref 36.0–46.0)
Hemoglobin: 13.7 g/dL (ref 12.0–15.0)
Lymphocytes Relative: 38 % (ref 12–46)
Lymphs Abs: 2.9 10*3/uL (ref 0.7–4.0)
MCH: 30 pg (ref 26.0–34.0)
MCHC: 34.3 g/dL (ref 30.0–36.0)
MCV: 87.5 fL (ref 78.0–100.0)
Monocytes Absolute: 0.7 10*3/uL (ref 0.1–1.0)
Monocytes Relative: 9 % (ref 3–12)
Neutro Abs: 3.7 10*3/uL (ref 1.7–7.7)
Neutrophils Relative %: 49 % (ref 43–77)
Platelets: 208 10*3/uL (ref 150–400)
RBC: 4.57 MIL/uL (ref 3.87–5.11)
RDW: 13.2 % (ref 11.5–15.5)
WBC: 7.5 10*3/uL (ref 4.0–10.5)

## 2012-06-19 LAB — BASIC METABOLIC PANEL
BUN: 13 mg/dL (ref 6–23)
CO2: 24 mEq/L (ref 19–32)
Calcium: 9.1 mg/dL (ref 8.4–10.5)
Chloride: 102 mEq/L (ref 96–112)
Creatinine, Ser: 0.66 mg/dL (ref 0.50–1.10)
GFR calc Af Amer: >90 mL/min (ref >90)
GFR calc non Af Amer: >90 mL/min (ref >90)
Glucose, Bld: 177 mg/dL — ABNORMAL HIGH (ref 70–99)
Potassium: 4.3 mEq/L (ref 3.5–5.1)
Sodium: 135 mEq/L (ref 135–145)

## 2012-06-19 LAB — POCT I-STAT TROPONIN I: Troponin i, poc: 0 ng/mL (ref 0.00–0.08)

## 2012-06-19 LAB — PRO B NATRIURETIC PEPTIDE: Pro B Natriuretic peptide (BNP): 326.6 pg/mL — ABNORMAL HIGH (ref 0–125)

## 2012-06-19 IMAGING — CR DG CHEST 2V
2 series · 2 of 2 positions shown · non-contrast
Comparison: [DATE]

CLINICAL DATA: Chest pain, shortness of breath, dizziness,
weakness, question CHF, history smoking, hypertension, diabetes

CHEST - 2 VIEW

[w chest pa]
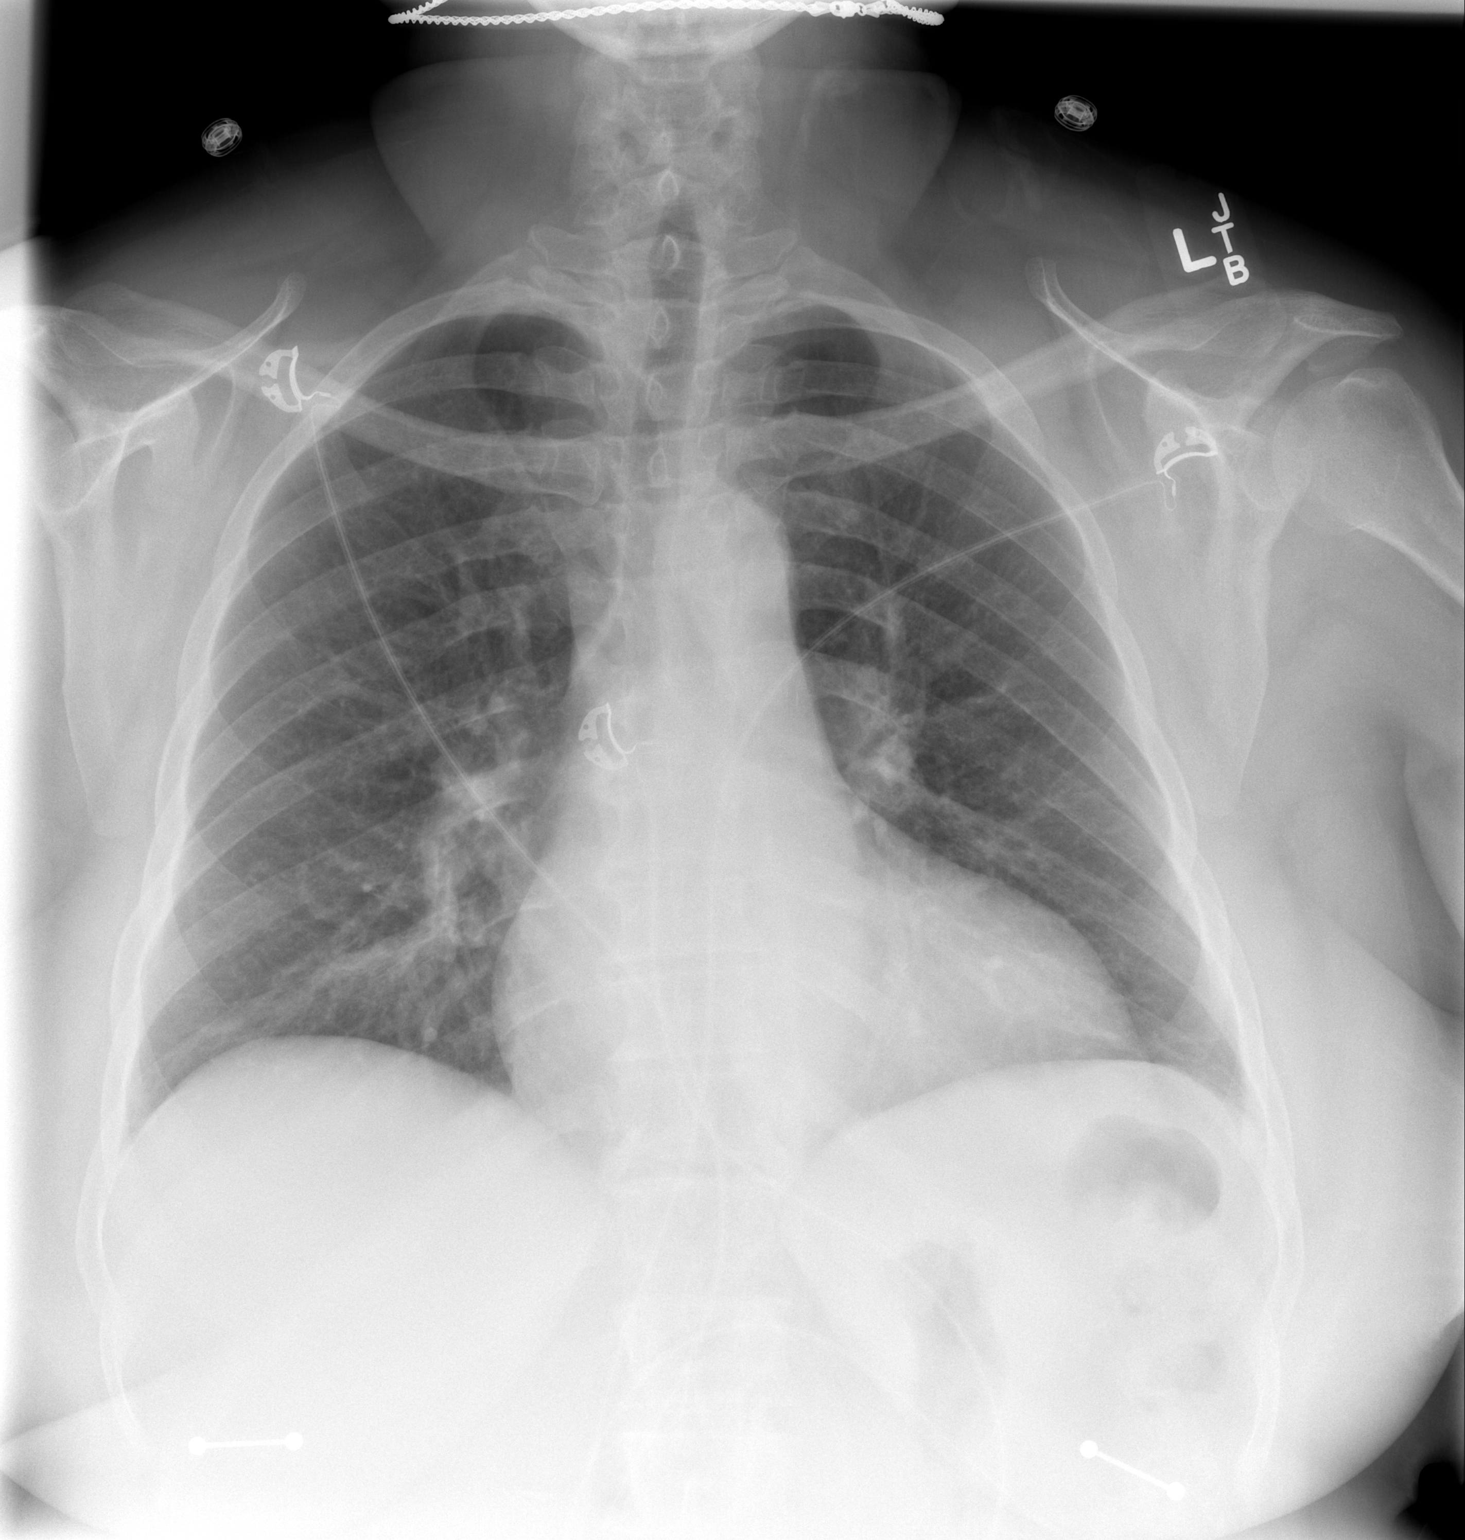

[w chest lat]
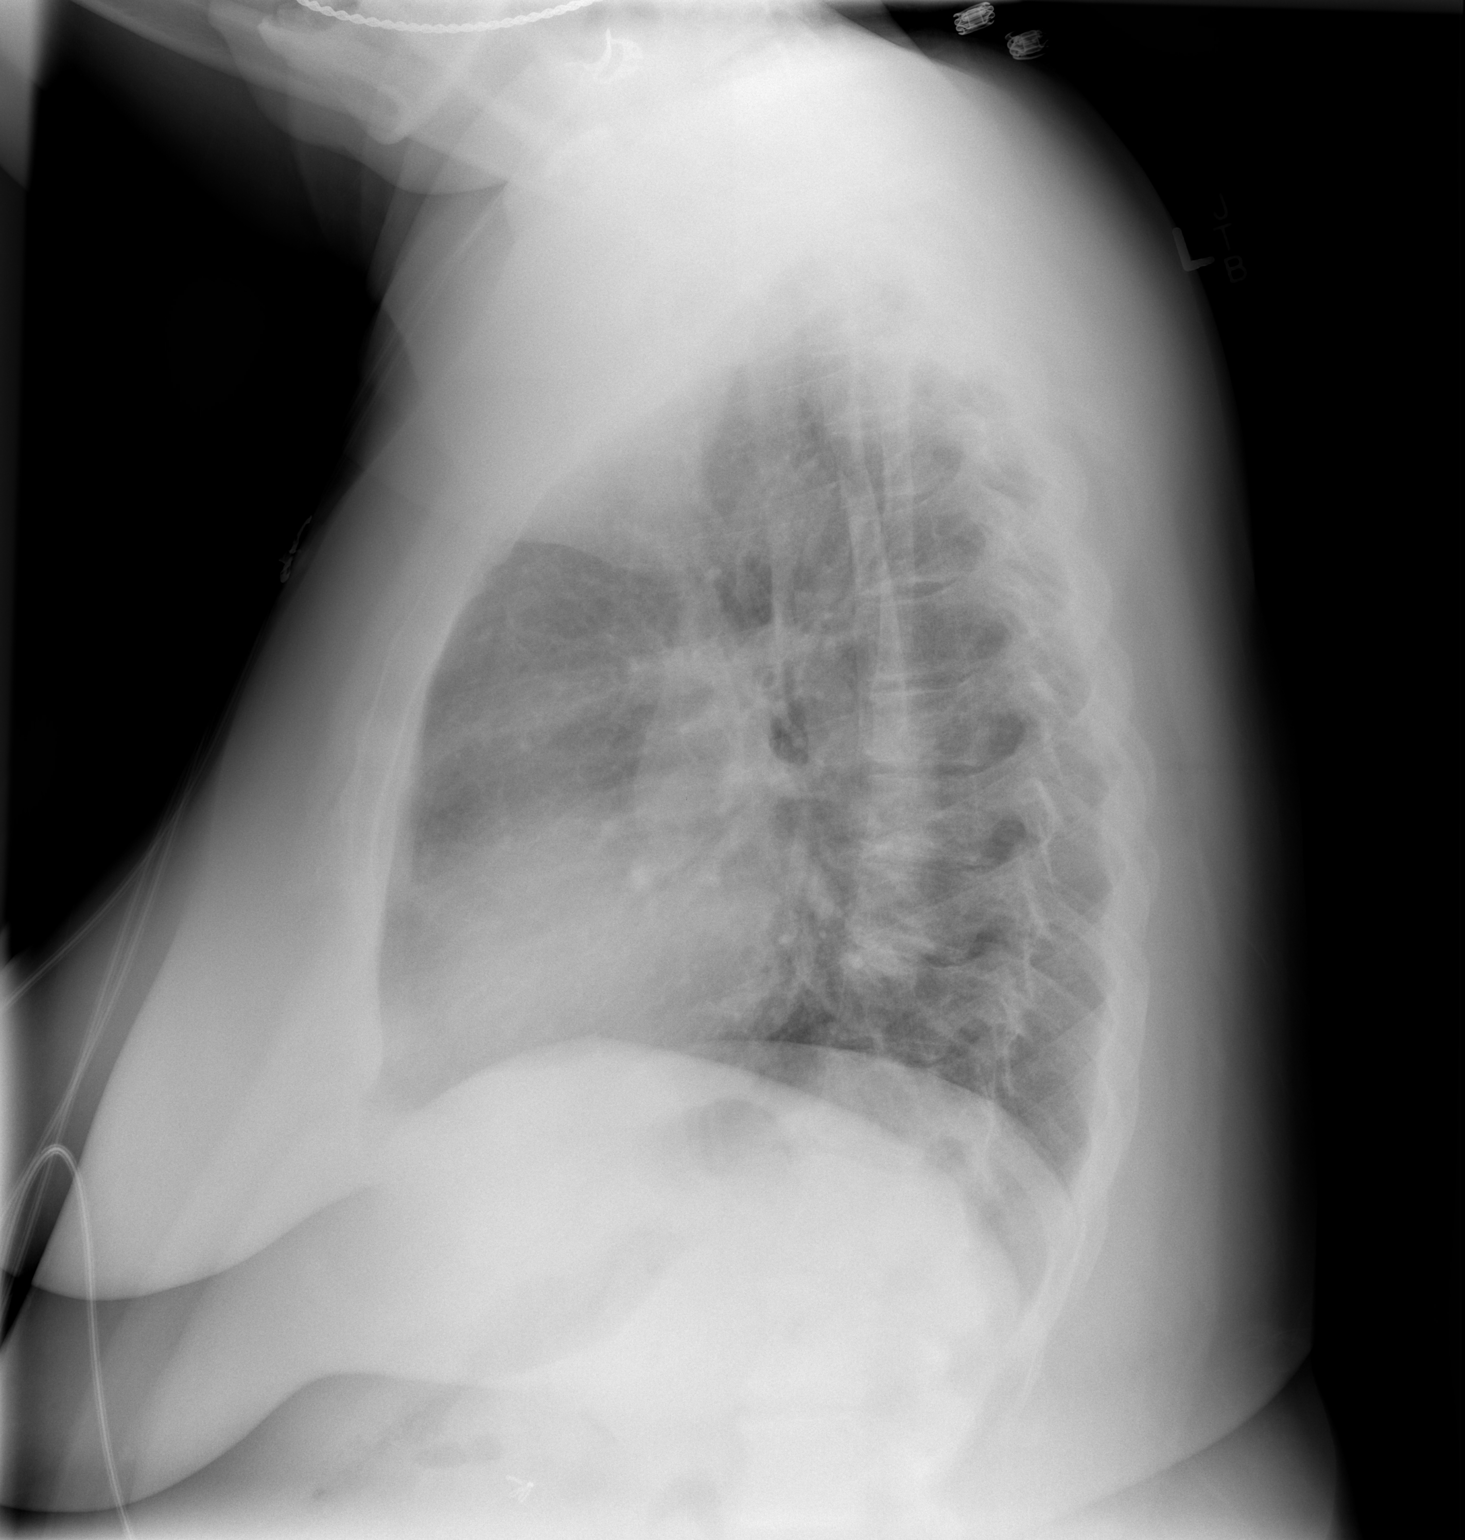

[2 of 2 positions shown; findings below may reference images not displayed]

FINDINGS: Enlargement of cardiac silhouette.
Tortuous aorta.
Pulmonary vascularity normal.
Minimal chronic peribronchial thickening.
Lungs clear.
No pleural effusion or pneumothorax.
Bones unremarkable.
IMPRESSION: Enlargement of cardiac silhouette.
No acute abnormalities.

## 2012-06-19 MED ORDER — IBUPROFEN 800 MG PO TABS
800.0000 mg | ORAL_TABLET | Freq: Once | ORAL | Status: AC
  Administered 2012-06-19: 800 mg via ORAL
  Filled 2012-06-19: qty 1

## 2012-06-19 NOTE — ED Notes (Signed)
Pt presents with 1 week h/o intermittent chest pain.  Pt reports pain is mid-sternal and radiates into L arm.  Shortness of breath with exertion.  Pt reports dry cough and sore throat that began this morning.

## 2012-06-19 NOTE — ED Notes (Signed)
PA at bedside.

## 2012-06-19 NOTE — ED Provider Notes (Signed)
History     CSN: [id number]  Arrival date & time 06/19/12  1201   First MD Initiated Contact with Patient 06/19/12 1405      No chief complaint on file.   (Consider location/radiation/quality/duration/timing/severity/associated sxs/prior treatment) HPI Comments: 46 year old female with history of CHF, COPD, hypertension and diabetes presents emergency department complaining of intermittent left-sided chest pain for the past week. Describes pain as sharp, radiating to her left arm rated 6/10. Nothing in specific makes the pain come on. She took hydrocodone which she takes for her arthritis which made the pain go away. Denies shortness of breath, however pain worse with deep inspiration. Developed a dry cough this morning. This does not feel like her prior CHF exacerbations. Admits to getting the table in her house earlier in the week and was doing heavy lifting. Denies nausea, vomiting or diaphoresis.  The history is provided by the patient.    Past Medical History  Diagnosis Date  . Type 2 diabetes mellitus   . Essential hypertension, benign   . GERD (gastroesophageal reflux disease)   . COPD (chronic obstructive pulmonary disease)   . Arthritis   . Normal coronary arteries     3/10 - following abnormal Myoview  . Diabetes mellitus   . CHF (congestive heart failure)     Past Surgical History  Procedure Date  . Tubal ligation   . Cholecystectomy   . Cesarean section   . Abdominal hysterectomy 09/10/2011    Procedure: HYSTERECTOMY ABDOMINAL;  Surgeon: Tilda Burrow, MD;  Location: [facility];  Service: Gynecology;  Laterality: N/A;  Abdominal hysterectomy  . Scar revision 09/10/2011    Procedure: SCAR REVISION;  Surgeon: Tilda Burrow, MD;  Location: [facility];  Service: Gynecology;  Laterality: N/A;  Wide Excision of old Cicatrix    Family History  Problem Relation Age of Onset  . Cirrhosis Mother   . Diabetes type II Father   . Diabetes type II Sister   . Anesthesia problems  Neg Hx   . Hypotension Neg Hx   . Malignant hyperthermia Neg Hx   . Pseudochol deficiency Neg Hx     History  Substance Use Topics  . Smoking status: Current Every Day Smoker -- 0.2 packs/day for 29 years    Types: Cigarettes  . Smokeless tobacco: Never Used     Comment: 1 cigarette a day  . Alcohol Use: 6.0 oz/week    10 Cans of beer per week     Comment: 4-5 beers on friday night and 4-5 beers on saturday night    OB History    Grav Para Term Preterm Abortions TAB SAB Ect Mult Living                  Review of Systems  Constitutional: Negative for diaphoresis.  HENT: Negative.   Eyes: Negative for visual disturbance.  Respiratory: Positive for cough. Negative for shortness of breath.   Cardiovascular: Positive for chest pain.  Gastrointestinal: Negative for nausea and vomiting.  Genitourinary: Negative.   Musculoskeletal: Negative.   Skin: Negative.   Neurological: Negative for weakness and headaches.  Psychiatric/Behavioral: The patient is not nervous/anxious.     Allergies  Bee venom and Naproxen  Home Medications   Current Outpatient Rx  Name  Route  Sig  Dispense  Refill  . IPRATROPIUM-ALBUTEROL 18-103 MCG/ACT IN AERO   Inhalation   Inhale 2 puffs into the lungs every 4 (four) hours as needed. For shortness of breath         .  ASPIRIN EC 81 MG PO TBEC   Oral   Take 81 mg by mouth daily.         . ENALAPRIL MALEATE 20 MG PO TABS   Oral   Take 20 mg by mouth daily.         Marland Kitchen GLIMEPIRIDE 4 MG PO TABS   Oral   Take 4 mg by mouth daily before breakfast.           . HYDROCHLOROTHIAZIDE 25 MG PO TABS   Oral   Take 25 mg by mouth daily.           Marland Kitchen METFORMIN HCL 1000 MG PO TABS   Oral   Take 1,000 mg by mouth 2 (two) times daily with a meal.             BP 170/77  Pulse 70  Temp 98.6 F (37 C) (Oral)  Resp 19  SpO2 100%  LMP 08/21/2011  Physical Exam  Nursing note and vitals reviewed. Constitutional: She is oriented to [place],  place, and time. She appears well-developed. No distress.       Overweight  HENT:  Head: Normocephalic and atraumatic.  Mouth/Throat: Oropharynx is clear and moist.  Eyes: Conjunctivae normal and EOM are normal. Pupils are equal, round, and reactive to light.  Neck: Normal range of motion. Neck supple. No JVD present.  Cardiovascular: Normal rate, regular rhythm, normal heart sounds and intact distal pulses.   Pulmonary/Chest: Effort normal. No respiratory distress. She has no decreased breath sounds. She has wheezes (mild scattered). She exhibits tenderness (left pectoral muscle).  Abdominal: Soft. Bowel sounds are normal. There is no tenderness.  Musculoskeletal: Normal range of motion. She exhibits no edema.  Neurological: She is alert and oriented to [place], place, and time.  Skin: Skin is warm and dry. No rash noted.  Psychiatric: She has a normal mood and affect. Her behavior is normal.    ED Course  Procedures (including critical care time)  Labs Reviewed  BASIC METABOLIC PANEL - Abnormal; Notable for the following:    Glucose, Bld 177 (*)     All other components within normal limits  PRO B NATRIURETIC PEPTIDE - Abnormal; Notable for the following:    Pro B Natriuretic peptide (BNP) 326.6 (*)     All other components within normal limits  CBC WITH DIFFERENTIAL  POCT I-STAT TROPONIN I   Date: 06/19/2012  Rate: 67  Rhythm: normal sinus rhythm  QRS Axis: normal  Intervals: normal  ST/T Wave abnormalities: nonspecific ST changes  Conduction Disutrbances:none  Narrative Interpretation: normal sinus, RAE, anteroseptal infarct, age undetermined, no significant changes since last ECG  Old EKG Reviewed: unchanged   Dg Chest 2 View  06/19/2012  *RADIOLOGY REPORT*  Clinical Data: Chest pain, shortness of breath, dizziness, weakness, question CHF, history smoking, hypertension, diabetes  CHEST - 2 VIEW  Comparison: 02/18/2012  Findings: Enlargement of cardiac silhouette. Tortuous  aorta. Pulmonary vascularity normal. Minimal chronic peribronchial thickening. Lungs clear. No pleural effusion or pneumothorax. Bones unremarkable.  IMPRESSION: Enlargement of cardiac silhouette. No acute abnormalities.   Original Report Authenticated By: Ulyses Southward, M.D.      1. Costochondritis   2. Chest wall pain       MDM  No findings on labs, EKG or CXR concerning patient's chest pain. Tenderness to palpation of left side of chest over pectoral muscle. History of lifting heavy table is consistent with muscle strain. 800mg  ibuprofen given with improvement. Patient discharged with  instructions to take ibuprofen and rest. Return precautions discussed.        Trevor Mace, PA-C 06/19/12 1524

## 2012-06-23 NOTE — ED Provider Notes (Signed)
Medical screening examination/treatment/procedure(s) were performed by non-physician practitioner and as supervising physician I was immediately available for consultation/collaboration.   Sarinah Doetsch, MD 06/23/12 1804 

## 2012-08-25 ENCOUNTER — Emergency Department (HOSPITAL_COMMUNITY): Payer: Self-pay

## 2012-08-25 ENCOUNTER — Encounter (HOSPITAL_COMMUNITY): Payer: Self-pay

## 2012-08-25 ENCOUNTER — Other Ambulatory Visit: Payer: Self-pay

## 2012-08-25 ENCOUNTER — Emergency Department (HOSPITAL_COMMUNITY)
Admission: EM | Admit: 2012-08-25 | Discharge: 2012-08-25 | Disposition: A | Discharge status: Home / Self Care | Payer: Self-pay | Attending: Emergency Medicine | Admitting: Emergency Medicine

## 2012-08-25 DIAGNOSIS — Z7982 Long term (current) use of aspirin: Secondary | ICD-10-CM | POA: Insufficient documentation

## 2012-08-25 DIAGNOSIS — Z8739 Personal history of other diseases of the musculoskeletal system and connective tissue: Secondary | ICD-10-CM | POA: Insufficient documentation

## 2012-08-25 DIAGNOSIS — J4489 Other specified chronic obstructive pulmonary disease: Secondary | ICD-10-CM | POA: Insufficient documentation

## 2012-08-25 DIAGNOSIS — J449 Chronic obstructive pulmonary disease, unspecified: Secondary | ICD-10-CM | POA: Insufficient documentation

## 2012-08-25 DIAGNOSIS — I509 Heart failure, unspecified: Secondary | ICD-10-CM | POA: Insufficient documentation

## 2012-08-25 DIAGNOSIS — K219 Gastro-esophageal reflux disease without esophagitis: Secondary | ICD-10-CM | POA: Insufficient documentation

## 2012-08-25 DIAGNOSIS — F17200 Nicotine dependence, unspecified, uncomplicated: Secondary | ICD-10-CM | POA: Insufficient documentation

## 2012-08-25 DIAGNOSIS — J069 Acute upper respiratory infection, unspecified: Secondary | ICD-10-CM | POA: Insufficient documentation

## 2012-08-25 DIAGNOSIS — E119 Type 2 diabetes mellitus without complications: Secondary | ICD-10-CM | POA: Insufficient documentation

## 2012-08-25 DIAGNOSIS — Z79899 Other long term (current) drug therapy: Secondary | ICD-10-CM | POA: Insufficient documentation

## 2012-08-25 DIAGNOSIS — I10 Essential (primary) hypertension: Secondary | ICD-10-CM | POA: Insufficient documentation

## 2012-08-25 LAB — GLUCOSE, CAPILLARY: Glucose-Capillary: 272 mg/dL — ABNORMAL HIGH (ref 70–99)

## 2012-08-25 IMAGING — CR DG CHEST 2V
2 series · 2 of 2 positions shown · non-contrast
Comparison: [DATE]

CLINICAL DATA: Cough

CHEST - 2 VIEW

[w chest pa]
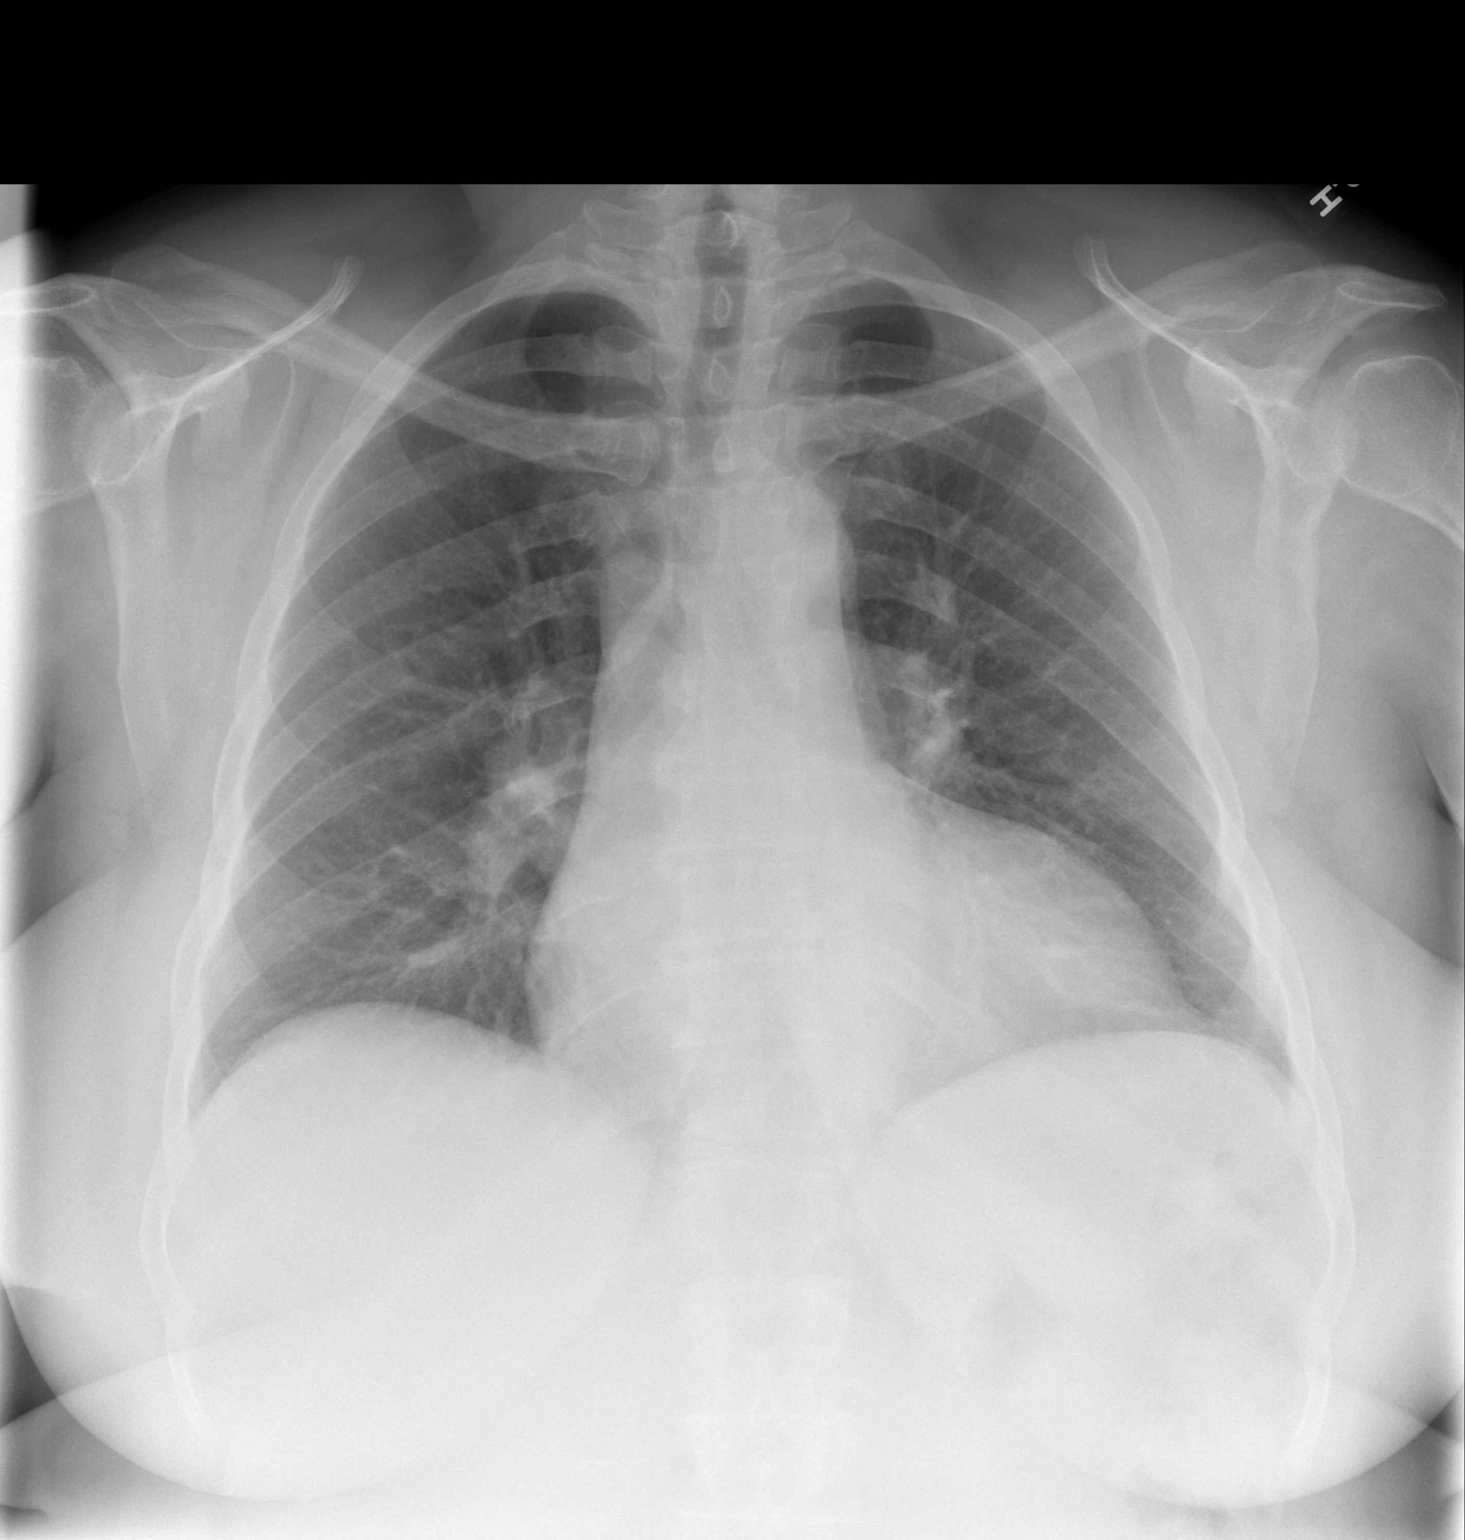

[w chest lat]
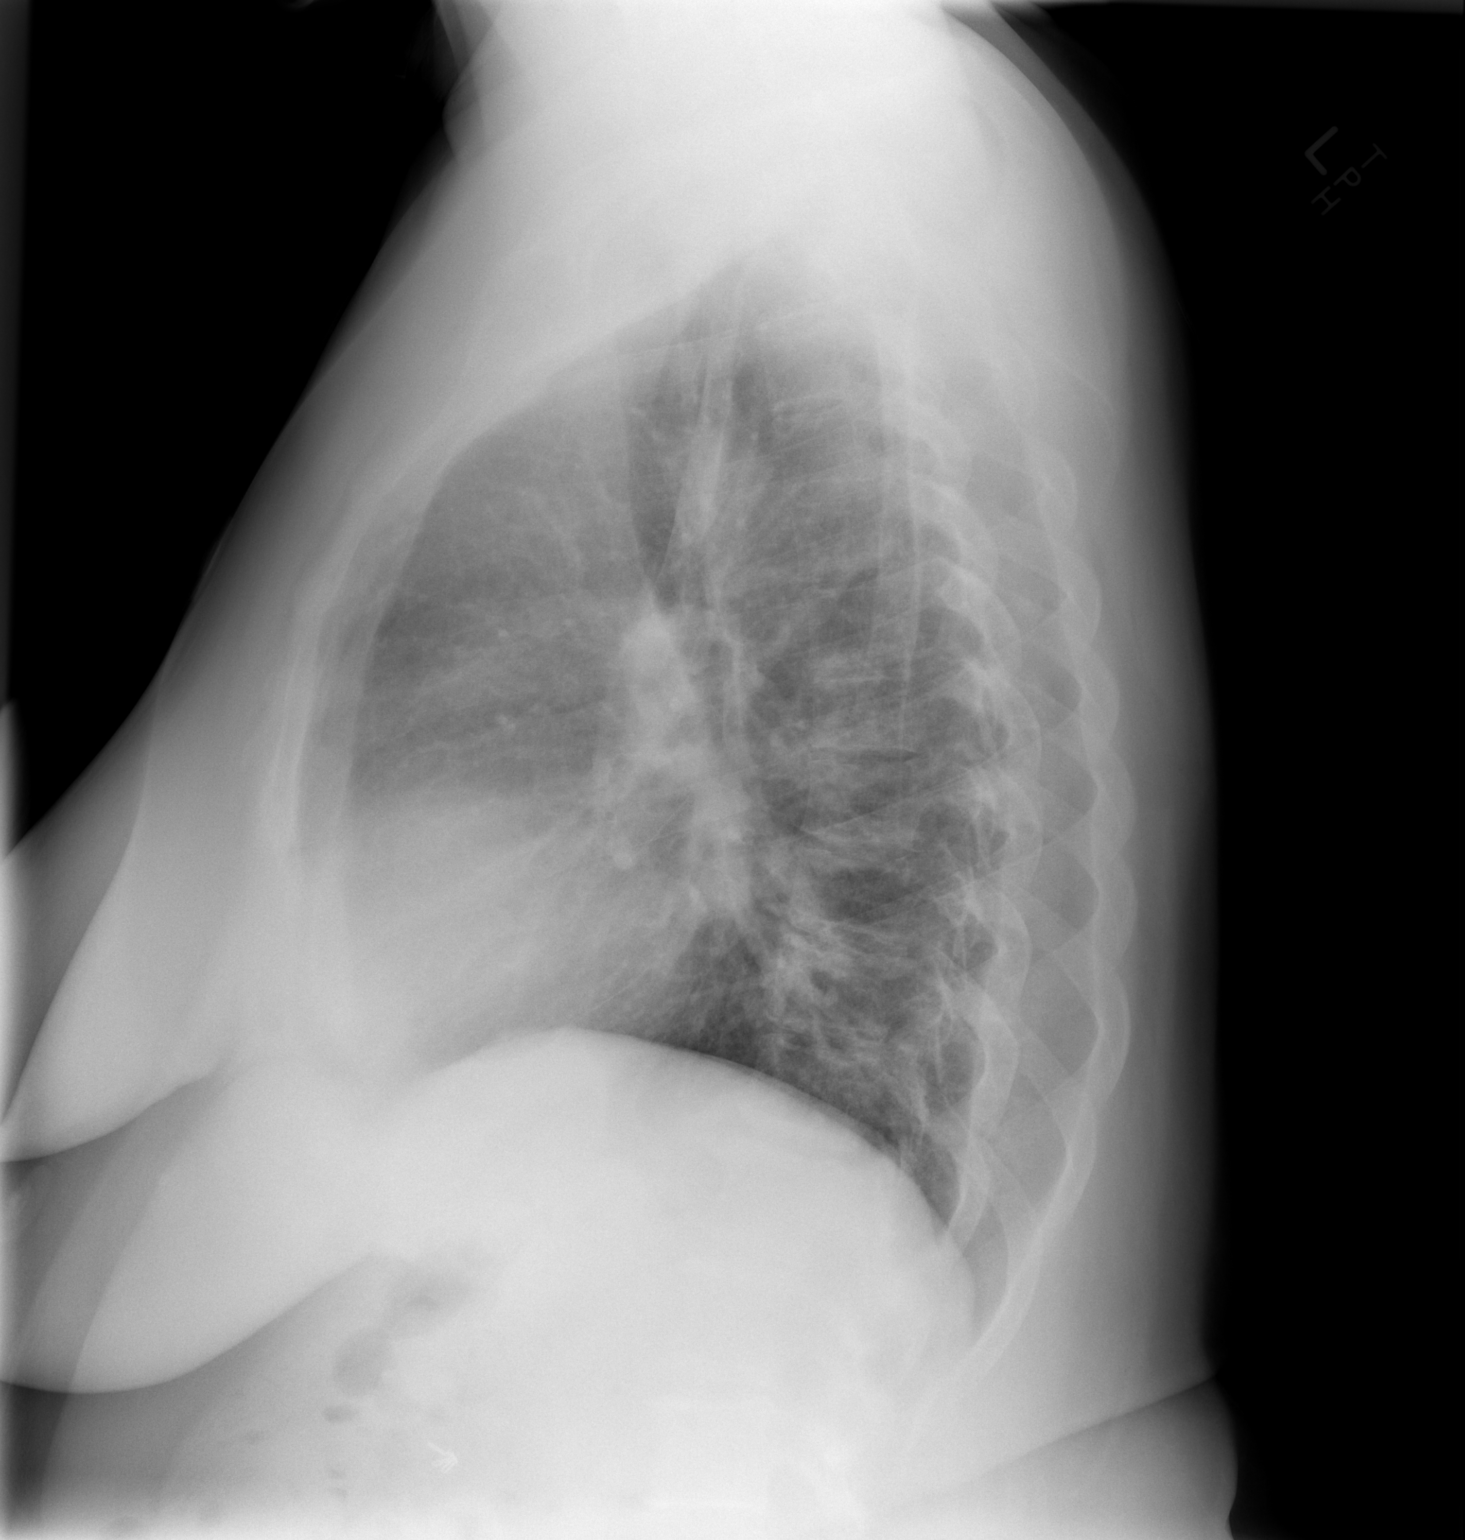

[2 of 2 positions shown; findings below may reference images not displayed]

FINDINGS: Lungs clear.  Heart is borderline enlarged with normal
pulmonary vascularity.  No adenopathy.  No bone lesions.
IMPRESSION: Stable cardiac prominence.  No edema or consolidation.

## 2012-08-25 MED ORDER — ACETAMINOPHEN 500 MG PO TABS
1000.0000 mg | ORAL_TABLET | Freq: Once | ORAL | Status: AC
  Administered 2012-08-25: 1000 mg via ORAL
  Filled 2012-08-25: qty 2

## 2012-08-25 MED ORDER — IPRATROPIUM BROMIDE 0.02 % IN SOLN
0.5000 mg | Freq: Once | RESPIRATORY_TRACT | Status: AC
  Administered 2012-08-25: 0.5 mg via RESPIRATORY_TRACT
  Filled 2012-08-25: qty 2.5

## 2012-08-25 MED ORDER — ALBUTEROL SULFATE HFA 108 (90 BASE) MCG/ACT IN AERS: 1.0000 | INHALATION_SPRAY | Freq: Four times a day (QID) | RESPIRATORY_TRACT | Status: DC | PRN

## 2012-08-25 MED ORDER — ALBUTEROL SULFATE (5 MG/ML) 0.5% IN NEBU
5.0000 mg | INHALATION_SOLUTION | Freq: Once | RESPIRATORY_TRACT | Status: AC
  Administered 2012-08-25: 5 mg via RESPIRATORY_TRACT
  Filled 2012-08-25: qty 1

## 2012-08-25 MED ORDER — AZITHROMYCIN 250 MG PO TABS: ORAL_TABLET | ORAL | Status: DC

## 2012-08-25 MED ORDER — HYDROCODONE-ACETAMINOPHEN 7.5-500 MG/15ML PO SOLN: 15.0000 mL | Freq: Four times a day (QID) | ORAL | Status: DC | PRN

## 2012-08-25 MED ORDER — HYDROCOD POLST-CHLORPHEN POLST 10-8 MG/5ML PO LQCR
5.0000 mL | Freq: Once | ORAL | Status: AC
  Administered 2012-08-25: 5 mL via ORAL
  Filled 2012-08-25: qty 5

## 2012-08-25 NOTE — ED Notes (Signed)
Pt. Has had cough and congestion with chills and fever for over 1 week .  In the last day the chest pain/ tightness and become worse, Pt. Has a tight cough.

## 2012-08-25 NOTE — ED Notes (Signed)
Pt was sick last week with flu like sx, presents today with non productive cough and mid upper chest pain, pt described pain as  " it feels like a pin is sticking me" rates cp 7/10.

## 2012-08-26 NOTE — ED Provider Notes (Signed)
History     CSN: [id number]  Arrival date & time 08/25/12  1316   First MD Initiated Contact with Patient 08/25/12 1720      Chief Complaint  Patient presents with  . Cough    (Consider location/radiation/quality/duration/timing/severity/associated sxs/prior treatment) HPI....Marland Kitchen cough and congestion for one week.   Coughing makes chest hurt.   Patient reports chills and fever.   Nothing makes symptoms better or worse. Severity is mild to moderate.  She is ambulatory and taking fluids well  Past Medical History  Diagnosis Date  . Type 2 diabetes mellitus   . Essential hypertension, benign   . GERD (gastroesophageal reflux disease)   . COPD (chronic obstructive pulmonary disease)   . Arthritis   . Normal coronary arteries     3/10 - following abnormal Myoview  . Diabetes mellitus   . CHF (congestive heart failure)     Past Surgical History  Procedure Date  . Tubal ligation   . Cholecystectomy   . Cesarean section   . Abdominal hysterectomy 09/10/2011    Procedure: HYSTERECTOMY ABDOMINAL;  Surgeon: Tilda Burrow, MD;  Location: [facility];  Service: Gynecology;  Laterality: N/A;  Abdominal hysterectomy  . Scar revision 09/10/2011    Procedure: SCAR REVISION;  Surgeon: Tilda Burrow, MD;  Location: [facility];  Service: Gynecology;  Laterality: N/A;  Wide Excision of old Cicatrix    Family History  Problem Relation Age of Onset  . Cirrhosis Mother   . Diabetes type II Father   . Diabetes type II Sister   . Anesthesia problems Neg Hx   . Hypotension Neg Hx   . Malignant hyperthermia Neg Hx   . Pseudochol deficiency Neg Hx     History  Substance Use Topics  . Smoking status: Current Every Day Smoker -- 0.2 packs/day for 29 years    Types: Cigarettes  . Smokeless tobacco: Never Used     Comment: 1 cigarette a day  . Alcohol Use: 6.0 oz/week    10 Cans of beer per week     Comment: 4-5 beers on friday night and 4-5 beers on saturday night    OB History    Grav Para Term  Preterm Abortions TAB SAB Ect Mult Living                  Review of Systems  All other systems reviewed and are negative.    Allergies  Bee venom and Naproxen  Home Medications   Current Outpatient Rx  Name  Route  Sig  Dispense  Refill  . IPRATROPIUM-ALBUTEROL 18-103 MCG/ACT IN AERO   Inhalation   Inhale 2 puffs into the lungs every 4 (four) hours as needed. For shortness of breath         . ASPIRIN EC 81 MG PO TBEC   Oral   Take 81 mg by mouth daily.         . ENALAPRIL MALEATE 20 MG PO TABS   Oral   Take 20 mg by mouth daily.         Marland Kitchen GLIMEPIRIDE 4 MG PO TABS   Oral   Take 4 mg by mouth daily before breakfast.           . HYDROCHLOROTHIAZIDE 25 MG PO TABS   Oral   Take 25 mg by mouth daily.           Marland Kitchen METFORMIN HCL 1000 MG PO TABS   Oral   Take 1,000  mg by mouth 2 (two) times daily with a meal.           . OMEPRAZOLE 20 MG PO CPDR   Oral   Take 20 mg by mouth daily.         . ALBUTEROL SULFATE HFA 108 (90 BASE) MCG/ACT IN AERS   Inhalation   Inhale 1-2 puffs into the lungs every 6 (six) hours as needed for wheezing.   1 Inhaler   0   . AZITHROMYCIN 250 MG PO TABS      2 po day one, then 1 daily x 4 days   5 tablet   0   . HYDROCODONE-ACETAMINOPHEN 7.5-500 MG/15ML PO SOLN   Oral   Take 15 mLs by mouth every 6 (six) hours as needed for pain or cough.   120 mL   0     BP 165/91  Pulse 86  Temp 98.8 F (37.1 C) (Oral)  Resp 12  SpO2 98%  LMP 08/21/2011  Physical Exam  Nursing note and vitals reviewed. Constitutional: She is oriented to [place], place, [name]  HENT:  Head: Normocephalic and atraumatic.  Eyes: Conjunctivae normal and EOM are normal. Pupils are equal, round, and reactive to light.  Neck: Normal range of motion. Neck supple.  Cardiovascular: Normal rate, regular rhythm and normal heart sounds.   Pulmonary/Chest: Effort normal.       Slight expiratory wheeze    Abdominal: Soft. Bowel sounds are normal.  Musculoskeletal: Normal range of motion.  Neurological: She is alert and oriented to [place], place, and time.  Skin: Skin is warm and dry.  Psychiatric: She has a normal mood and affect.    ED Course  Procedures (including critical care time)  Labs Reviewed  GLUCOSE, CAPILLARY - Abnormal; Notable for the following:    Glucose-Capillary 272 (*)     All other components within normal limits  LAB REPORT - SCANNED   Dg Chest 2 View  08/25/2012  *RADIOLOGY REPORT*  Clinical Data: Cough  CHEST - 2 VIEW  Comparison: June 19, 2012  Findings:  Lungs clear.  Heart is borderline enlarged with normal pulmonary vascularity.  No adenopathy.  No bone lesions.  IMPRESSION: Stable cardiac prominence.  No edema or consolidation.   Original Report Authenticated By: Bretta Bang, M.D.      1. URI (upper respiratory infection)       MDM  Chest x-ray is negative. Patient is nontoxic. Discharge  on Zithromax, albuterol inhaler, Lortab elixir for cough        Donnetta Hutching, MD 08/26/12 2110

## 2012-09-08 ENCOUNTER — Encounter: Payer: Self-pay | Admitting: Adult Health

## 2012-09-16 ENCOUNTER — Other Ambulatory Visit: Payer: Self-pay

## 2012-09-16 ENCOUNTER — Encounter (HOSPITAL_COMMUNITY): Payer: Self-pay | Admitting: *Deleted

## 2012-09-16 ENCOUNTER — Emergency Department (HOSPITAL_COMMUNITY)
Admission: EM | Admit: 2012-09-16 | Discharge: 2012-09-16 | Disposition: A | Discharge status: Home / Self Care | Payer: Self-pay | Attending: Emergency Medicine | Admitting: Emergency Medicine

## 2012-09-16 DIAGNOSIS — R062 Wheezing: Secondary | ICD-10-CM

## 2012-09-16 DIAGNOSIS — Z7982 Long term (current) use of aspirin: Secondary | ICD-10-CM | POA: Insufficient documentation

## 2012-09-16 DIAGNOSIS — Z79899 Other long term (current) drug therapy: Secondary | ICD-10-CM | POA: Insufficient documentation

## 2012-09-16 DIAGNOSIS — J441 Chronic obstructive pulmonary disease with (acute) exacerbation: Secondary | ICD-10-CM | POA: Insufficient documentation

## 2012-09-16 DIAGNOSIS — I509 Heart failure, unspecified: Secondary | ICD-10-CM | POA: Insufficient documentation

## 2012-09-16 DIAGNOSIS — K219 Gastro-esophageal reflux disease without esophagitis: Secondary | ICD-10-CM | POA: Insufficient documentation

## 2012-09-16 DIAGNOSIS — Z8709 Personal history of other diseases of the respiratory system: Secondary | ICD-10-CM | POA: Insufficient documentation

## 2012-09-16 DIAGNOSIS — E119 Type 2 diabetes mellitus without complications: Secondary | ICD-10-CM | POA: Insufficient documentation

## 2012-09-16 DIAGNOSIS — R0602 Shortness of breath: Secondary | ICD-10-CM | POA: Insufficient documentation

## 2012-09-16 DIAGNOSIS — M129 Arthropathy, unspecified: Secondary | ICD-10-CM | POA: Insufficient documentation

## 2012-09-16 DIAGNOSIS — R079 Chest pain, unspecified: Secondary | ICD-10-CM | POA: Insufficient documentation

## 2012-09-16 DIAGNOSIS — F17200 Nicotine dependence, unspecified, uncomplicated: Secondary | ICD-10-CM | POA: Insufficient documentation

## 2012-09-16 DIAGNOSIS — I10 Essential (primary) hypertension: Secondary | ICD-10-CM | POA: Insufficient documentation

## 2012-09-16 MED ORDER — IPRATROPIUM BROMIDE 0.02 % IN SOLN
0.5000 mg | Freq: Once | RESPIRATORY_TRACT | Status: AC
  Administered 2012-09-16: 0.5 mg via RESPIRATORY_TRACT
  Filled 2012-09-16: qty 2.5

## 2012-09-16 MED ORDER — PREDNISONE 50 MG PO TABS
60.0000 mg | ORAL_TABLET | Freq: Once | ORAL | Status: AC
  Administered 2012-09-16: 60 mg via ORAL
  Filled 2012-09-16: qty 1

## 2012-09-16 MED ORDER — ALBUTEROL SULFATE (5 MG/ML) 0.5% IN NEBU
2.5000 mg | INHALATION_SOLUTION | Freq: Once | RESPIRATORY_TRACT | Status: AC
  Administered 2012-09-16: 2.5 mg via RESPIRATORY_TRACT
  Filled 2012-09-16: qty 0.5

## 2012-09-16 MED ORDER — PREDNISONE 10 MG PO TABS: 20.0000 mg | ORAL_TABLET | Freq: Every day | ORAL | Status: DC

## 2012-09-16 NOTE — ED Provider Notes (Signed)
History     CSN: [id number]  Arrival date & time 09/16/12  0145   First MD Initiated Contact with Patient 09/16/12 0202      Chief Complaint  Patient presents with  . Chest Pain    chest congestion for 2 weeks  . Cough    (Consider location/radiation/quality/duration/timing/severity/associated sxs/prior treatment) HPI Robin [name] is a 47 y.o. female who presents to the Emergency Department complaining of  Chest pain, cough and shortness of breath for 2 weeks. Patient was seen at [facility] and diagnosed with a URI, given zithromax, cough medicine. Continues to have a cough, shortness of breath, occasional wheezing, chest discomfort. Denies fever, chills.  PCP Dr. Anastasia Fiedler    Past Medical History  Diagnosis Date  . Type 2 diabetes mellitus   . Essential hypertension, benign   . GERD (gastroesophageal reflux disease)   . COPD (chronic obstructive pulmonary disease)   . Arthritis   . Normal coronary arteries     3/10 - following abnormal Myoview  . Diabetes mellitus   . CHF (congestive heart failure)     Past Surgical History  Procedure Laterality Date  . Tubal ligation    . Cholecystectomy    . Cesarean section    . Abdominal hysterectomy  09/10/2011    Procedure: HYSTERECTOMY ABDOMINAL;  Surgeon: Tilda Burrow, MD;  Location: [facility];  Service: Gynecology;  Laterality: N/A;  Abdominal hysterectomy  . Scar revision  09/10/2011    Procedure: SCAR REVISION;  Surgeon: Tilda Burrow, MD;  Location: [facility];  Service: Gynecology;  Laterality: N/A;  Wide Excision of old Cicatrix    Family History  Problem Relation Age of Onset  . Cirrhosis Mother   . Diabetes type II Father   . Diabetes type II Sister   . Anesthesia problems Neg Hx   . Hypotension Neg Hx   . Malignant hyperthermia Neg Hx   . Pseudochol deficiency Neg Hx     History  Substance Use Topics  . Smoking status: Current Every Day Smoker -- 0.25 packs/day for 29 years    Types: Cigarettes  . Smokeless tobacco:  Never Used     Comment: 1 cigarette a day  . Alcohol Use: 6.0 oz/week    10 Cans of beer per week     Comment: 4-5 beers on friday night and 4-5 beers on saturday night    OB History   Grav Para Term Preterm Abortions TAB SAB Ect Mult Living                  Review of Systems  Constitutional: Negative for fever.       10 Systems reviewed and are negative for acute change except as noted in the HPI.  HENT: Negative for congestion.   Eyes: Negative for discharge and redness.  Respiratory: Positive for cough, shortness of breath and wheezing.   Cardiovascular: Negative for chest pain.  Gastrointestinal: Negative for vomiting and abdominal pain.  Musculoskeletal: Negative for back pain.  Skin: Negative for rash.  Neurological: Negative for syncope, numbness and headaches.  Psychiatric/Behavioral:       No behavior change.    Allergies  Bee venom and Naproxen  Home Medications   Current Outpatient Rx  Name  Route  Sig  Dispense  Refill  . albuterol (PROVENTIL HFA;VENTOLIN HFA) 108 (90 BASE) MCG/ACT inhaler   Inhalation   Inhale 1-2 puffs into the lungs every 6 (six) hours as needed for wheezing.   1  Inhaler   0   . albuterol-ipratropium (COMBIVENT) 18-103 MCG/ACT inhaler   Inhalation   Inhale 2 puffs into the lungs every 4 (four) hours as needed. For shortness of breath         . aspirin EC 81 MG tablet   Oral   Take 81 mg by mouth daily.         Marland Kitchen azithromycin (ZITHROMAX Z-PAK) 250 MG tablet      2 po day one, then 1 daily x 4 days   5 tablet   0   . enalapril (VASOTEC) 20 MG tablet   Oral   Take 20 mg by mouth daily.         Marland Kitchen glimepiride (AMARYL) 4 MG tablet   Oral   Take 4 mg by mouth daily before breakfast.           . hydrochlorothiazide (HYDRODIURIL) 25 MG tablet   Oral   Take 25 mg by mouth daily.           Marland Kitchen HYDROcodone-acetaminophen (LORTAB) 7.5-500 MG/15ML solution   Oral   Take 15 mLs by mouth every 6 (six) hours as needed for pain  or cough.   120 mL   0   . metFORMIN (GLUCOPHAGE) 1000 MG tablet   Oral   Take 1,000 mg by mouth 2 (two) times daily with a meal.           . omeprazole (PRILOSEC) 20 MG capsule   Oral   Take 20 mg by mouth daily.           BP 179/95  Pulse 93  Temp(Src) 98.7 F (37.1 C) (Oral)  Resp 22  Ht 5\' 6"  (1.676 m)  Wt 220 lb (99.791 kg)  BMI 35.53 kg/m2  SpO2 100%  LMP 08/21/2011  Physical Exam  Nursing note and vitals reviewed. Constitutional:  Awake, alert, nontoxic appearance.  HENT:  Head: Atraumatic.  Eyes: Right eye exhibits no discharge. Left eye exhibits no discharge.  Neck: Neck supple.  Cardiovascular: Normal rate.   Pulmonary/Chest: Effort normal. She has wheezes. She exhibits no tenderness.  Abdominal: Soft. There is no tenderness. There is no rebound.  Musculoskeletal: She exhibits no tenderness.  Baseline ROM, no obvious new focal weakness.  Neurological:  Mental status and motor strength appears baseline for patient and situation.  Skin: No rash noted.  Psychiatric: She has a normal mood and affect.    ED Course  Procedures (including critical care time)   Date: 09/16/2012   0157  Rate: 95  Rhythm: normal sinus rhythm  QRS Axis: normal  Intervals: normal  ST/T Wave abnormalities: normal  Conduction Disutrbances: none  Narrative Interpretation: anteroseptal infarct, age undetermined     MDM   Patient with cough, wheezing, received albuterol/atrovent treatment. Wheezing improved. Prednisone given. Pt stable in ED with no significant deterioration in condition.The patient appears reasonably screened and/or stabilized for discharge and I doubt any other medical condition or other [facility] requiring further screening, evaluation, or treatment in the ED at this time prior to discharge. MDM Reviewed: nursing note and vitals          Nicoletta Dress. Colon Branch, MD 09/16/12 [id number]

## 2012-11-24 ENCOUNTER — Encounter (HOSPITAL_COMMUNITY): Payer: Self-pay | Admitting: *Deleted

## 2012-11-24 ENCOUNTER — Emergency Department (HOSPITAL_COMMUNITY)
Admission: EM | Admit: 2012-11-24 | Discharge: 2012-11-24 | Disposition: A | Discharge status: Home / Self Care | Payer: Self-pay | Attending: Emergency Medicine | Admitting: Emergency Medicine

## 2012-11-24 DIAGNOSIS — I10 Essential (primary) hypertension: Secondary | ICD-10-CM | POA: Insufficient documentation

## 2012-11-24 DIAGNOSIS — K029 Dental caries, unspecified: Secondary | ICD-10-CM | POA: Insufficient documentation

## 2012-11-24 DIAGNOSIS — F17200 Nicotine dependence, unspecified, uncomplicated: Secondary | ICD-10-CM | POA: Insufficient documentation

## 2012-11-24 DIAGNOSIS — K219 Gastro-esophageal reflux disease without esophagitis: Secondary | ICD-10-CM | POA: Insufficient documentation

## 2012-11-24 DIAGNOSIS — J449 Chronic obstructive pulmonary disease, unspecified: Secondary | ICD-10-CM | POA: Insufficient documentation

## 2012-11-24 DIAGNOSIS — K089 Disorder of teeth and supporting structures, unspecified: Secondary | ICD-10-CM | POA: Insufficient documentation

## 2012-11-24 DIAGNOSIS — Z7982 Long term (current) use of aspirin: Secondary | ICD-10-CM | POA: Insufficient documentation

## 2012-11-24 DIAGNOSIS — K0889 Other specified disorders of teeth and supporting structures: Secondary | ICD-10-CM

## 2012-11-24 DIAGNOSIS — Z8739 Personal history of other diseases of the musculoskeletal system and connective tissue: Secondary | ICD-10-CM | POA: Insufficient documentation

## 2012-11-24 DIAGNOSIS — I509 Heart failure, unspecified: Secondary | ICD-10-CM | POA: Insufficient documentation

## 2012-11-24 DIAGNOSIS — Z79899 Other long term (current) drug therapy: Secondary | ICD-10-CM | POA: Insufficient documentation

## 2012-11-24 DIAGNOSIS — J4489 Other specified chronic obstructive pulmonary disease: Secondary | ICD-10-CM | POA: Insufficient documentation

## 2012-11-24 DIAGNOSIS — E1169 Type 2 diabetes mellitus with other specified complication: Secondary | ICD-10-CM | POA: Insufficient documentation

## 2012-11-24 MED ORDER — HYDROCODONE-ACETAMINOPHEN 5-325 MG PO TABS: 2.0000 | ORAL_TABLET | ORAL | Status: DC | PRN

## 2012-11-24 MED ORDER — PENICILLIN V POTASSIUM 500 MG PO TABS: 500.0000 mg | ORAL_TABLET | Freq: Three times a day (TID) | ORAL | Status: DC

## 2012-11-24 NOTE — ED Provider Notes (Signed)
History  This chart was scribed for Geoffery Lyons, MD by Ardeen Jourdain, ED Scribe. This patient was seen in room [facility] and the patient's care was started at 1433.  CSN: [id number]  Arrival date & time 11/24/12  1413   First MD Initiated Contact with Patient 11/24/12 1433      Chief Complaint  Patient presents with  . Dental Pain  . Hyperglycemia     The history is provided by the patient. No language interpreter was used.    Robin [name] is a 47 y.o. female who presents to the Emergency Department complaining of gradual onset, gradually worsening, intermittent left upper dental pain that began 3 days ago. She states a filling fell out of the tooth. She describes the pain as a stabbing feeling. She denies any nausea, emesis or fever. She states she has been unable to see a dentist for the issues.   Past Medical History  Diagnosis Date  . Type 2 diabetes mellitus   . Essential hypertension, benign   . GERD (gastroesophageal reflux disease)   . COPD (chronic obstructive pulmonary disease)   . Arthritis   . Normal coronary arteries     3/10 - following abnormal Myoview  . Diabetes mellitus   . CHF (congestive heart failure)     Past Surgical History  Procedure Laterality Date  . Tubal ligation    . Cholecystectomy    . Cesarean section    . Abdominal hysterectomy  09/10/2011    Procedure: HYSTERECTOMY ABDOMINAL;  Surgeon: Tilda Burrow, MD;  Location: [facility];  Service: Gynecology;  Laterality: N/A;  Abdominal hysterectomy  . Scar revision  09/10/2011    Procedure: SCAR REVISION;  Surgeon: Tilda Burrow, MD;  Location: [facility];  Service: Gynecology;  Laterality: N/A;  Wide Excision of old Cicatrix    Family History  Problem Relation Age of Onset  . Cirrhosis Mother   . Diabetes type II Father   . Diabetes type II Sister   . Anesthesia problems Neg Hx   . Hypotension Neg Hx   . Malignant hyperthermia Neg Hx   . Pseudochol deficiency Neg Hx     History   Substance Use Topics  . Smoking status: Current Every Day Smoker -- 0.25 packs/day for 29 years    Types: Cigarettes  . Smokeless tobacco: Never Used     Comment: 1 cigarette a day  . Alcohol Use: 6.0 oz/week    10 Cans of beer per week     Comment: 4-5 beers on friday night and 4-5 beers on saturday night   No OB history available.   Review of Systems  Constitutional: Negative for fever and chills.  HENT: Positive for dental problem. Negative for sore throat, drooling, mouth sores and trouble swallowing.   Respiratory: Negative for shortness of breath.   Gastrointestinal: Negative for nausea and vomiting.  Neurological: Negative for weakness.  All other systems reviewed and are negative.    Allergies  Bee venom and Naproxen  Home Medications   Current Outpatient Rx  Name  Route  Sig  Dispense  Refill  . albuterol (PROVENTIL HFA;VENTOLIN HFA) 108 (90 BASE) MCG/ACT inhaler   Inhalation   Inhale 1-2 puffs into the lungs every 6 (six) hours as needed for wheezing.   1 Inhaler   0   . albuterol-ipratropium (COMBIVENT) 18-103 MCG/ACT inhaler   Inhalation   Inhale 2 puffs into the lungs every 4 (four) hours as needed. For shortness of breath         .  aspirin EC 81 MG tablet   Oral   Take 81 mg by mouth daily.         Marland Kitchen azithromycin (ZITHROMAX Z-PAK) 250 MG tablet      2 po day one, then 1 daily x 4 days   5 tablet   0   . enalapril (VASOTEC) 20 MG tablet   Oral   Take 20 mg by mouth daily.         Marland Kitchen glimepiride (AMARYL) 4 MG tablet   Oral   Take 4 mg by mouth daily before breakfast.           . hydrochlorothiazide (HYDRODIURIL) 25 MG tablet   Oral   Take 25 mg by mouth daily.           Marland Kitchen HYDROcodone-acetaminophen (LORTAB) 7.5-500 MG/15ML solution   Oral   Take 15 mLs by mouth every 6 (six) hours as needed for pain or cough.   120 mL   0   . metFORMIN (GLUCOPHAGE) 1000 MG tablet   Oral   Take 1,000 mg by mouth 2 (two) times daily with a meal.            . omeprazole (PRILOSEC) 20 MG capsule   Oral   Take 20 mg by mouth daily.         . predniSONE (DELTASONE) 10 MG tablet   Oral   Take 2 tablets (20 mg total) by mouth daily.   10 tablet   0     Triage Vitals: BP 161/81  Pulse 84  Temp(Src) 98.1 F (36.7 C) (Oral)  Resp 18  Ht 5\' 6"  (1.676 m)  Wt 241 lb (109.317 kg)  BMI 38.92 kg/m2  SpO2 98%  LMP 08/21/2011  Physical Exam  Nursing note and vitals reviewed. Constitutional: She is oriented to [place], place, [name] No distress.  HENT:  Head: Normocephalic and atraumatic.  Mouth/Throat: Uvula is midline, oropharynx is clear and moist and mucous membranes are normal. Dental caries present. No oropharyngeal exudate.  Multiple carries in the left upper molars. Fillings are in place. Mild gingival inflammation. TTP of the left cheek and submandibular region    Eyes: EOM are normal. Pupils are equal, round, and reactive to light.  Neck: Normal range of motion. Neck supple. No tracheal deviation present.  Cardiovascular: Normal rate.   Pulmonary/Chest: Effort normal. No respiratory distress.  Abdominal: Soft. She exhibits no distension.  Musculoskeletal: Normal range of motion. She exhibits no edema.  Neurological: She is alert and oriented to [place], place, and time.  Skin: Skin is warm and dry. She is not diaphoretic.  Psychiatric: She has a normal mood and affect. Her behavior is normal.    ED Course  Procedures (including critical care time)  DIAGNOSTIC STUDIES: Oxygen Saturation is 98% on room air, normal by my interpretation.    COORDINATION OF CARE:  2:39 PM-Discussed treatment plan which includes antibiotics and pain medication with pt at bedside and pt agreed to plan.    Labs Reviewed - No data to display No results found.   No diagnosis found.    MDM  Will treat with antibiotics, pain meds, follow up with dentistry this week.      I  personally performed the services described in this documentation, which was scribed in my presence. The recorded information has been reviewed and is accurate.      Geoffery Lyons, MD 11/24/12 2149

## 2012-11-24 NOTE — ED Notes (Signed)
CBG in triage was 251

## 2012-11-24 NOTE — ED Notes (Signed)
Pt with left upper dental pain "for awhile" but worse since Sunday

## 2012-11-25 LAB — GLUCOSE, CAPILLARY: Glucose-Capillary: 251 mg/dL — ABNORMAL HIGH (ref 70–99)

## 2012-12-18 ENCOUNTER — Encounter (HOSPITAL_COMMUNITY): Payer: Self-pay | Admitting: Cardiology

## 2012-12-18 ENCOUNTER — Emergency Department (HOSPITAL_COMMUNITY)
Admission: EM | Admit: 2012-12-18 | Discharge: 2012-12-18 | Disposition: A | Discharge status: Home / Self Care | Payer: Self-pay | Attending: Emergency Medicine | Admitting: Emergency Medicine

## 2012-12-18 ENCOUNTER — Emergency Department (HOSPITAL_COMMUNITY): Payer: Self-pay

## 2012-12-18 DIAGNOSIS — Y9389 Activity, other specified: Secondary | ICD-10-CM | POA: Insufficient documentation

## 2012-12-18 DIAGNOSIS — K219 Gastro-esophageal reflux disease without esophagitis: Secondary | ICD-10-CM | POA: Insufficient documentation

## 2012-12-18 DIAGNOSIS — R0602 Shortness of breath: Secondary | ICD-10-CM | POA: Insufficient documentation

## 2012-12-18 DIAGNOSIS — E119 Type 2 diabetes mellitus without complications: Secondary | ICD-10-CM | POA: Insufficient documentation

## 2012-12-18 DIAGNOSIS — W108XXA Fall (on) (from) other stairs and steps, initial encounter: Secondary | ICD-10-CM | POA: Insufficient documentation

## 2012-12-18 DIAGNOSIS — R05 Cough: Secondary | ICD-10-CM | POA: Insufficient documentation

## 2012-12-18 DIAGNOSIS — Y9289 Other specified places as the place of occurrence of the external cause: Secondary | ICD-10-CM | POA: Insufficient documentation

## 2012-12-18 DIAGNOSIS — W102XXA Fall (on)(from) incline, initial encounter: Secondary | ICD-10-CM

## 2012-12-18 DIAGNOSIS — I509 Heart failure, unspecified: Secondary | ICD-10-CM | POA: Insufficient documentation

## 2012-12-18 DIAGNOSIS — I10 Essential (primary) hypertension: Secondary | ICD-10-CM | POA: Insufficient documentation

## 2012-12-18 DIAGNOSIS — S298XXA Other specified injuries of thorax, initial encounter: Secondary | ICD-10-CM | POA: Insufficient documentation

## 2012-12-18 DIAGNOSIS — M549 Dorsalgia, unspecified: Secondary | ICD-10-CM

## 2012-12-18 DIAGNOSIS — Z8739 Personal history of other diseases of the musculoskeletal system and connective tissue: Secondary | ICD-10-CM | POA: Insufficient documentation

## 2012-12-18 DIAGNOSIS — F17200 Nicotine dependence, unspecified, uncomplicated: Secondary | ICD-10-CM | POA: Insufficient documentation

## 2012-12-18 DIAGNOSIS — IMO0002 Reserved for concepts with insufficient information to code with codable children: Secondary | ICD-10-CM | POA: Insufficient documentation

## 2012-12-18 DIAGNOSIS — W010XXA Fall on same level from slipping, tripping and stumbling without subsequent striking against object, initial encounter: Secondary | ICD-10-CM | POA: Insufficient documentation

## 2012-12-18 DIAGNOSIS — R059 Cough, unspecified: Secondary | ICD-10-CM | POA: Insufficient documentation

## 2012-12-18 DIAGNOSIS — J441 Chronic obstructive pulmonary disease with (acute) exacerbation: Secondary | ICD-10-CM | POA: Insufficient documentation

## 2012-12-18 DIAGNOSIS — Z7982 Long term (current) use of aspirin: Secondary | ICD-10-CM | POA: Insufficient documentation

## 2012-12-18 DIAGNOSIS — Z79899 Other long term (current) drug therapy: Secondary | ICD-10-CM | POA: Insufficient documentation

## 2012-12-18 IMAGING — CR DG THORACIC SPINE 2V
3 series · 3 of 3 positions shown · non-contrast
Comparison: Lumbar spine plain films same date.  [DATE] chest x-
ray.

CLINICAL DATA: Fall.  Back pain.

THORACIC SPINE - 2 VIEW

[t thoracic spine ap]
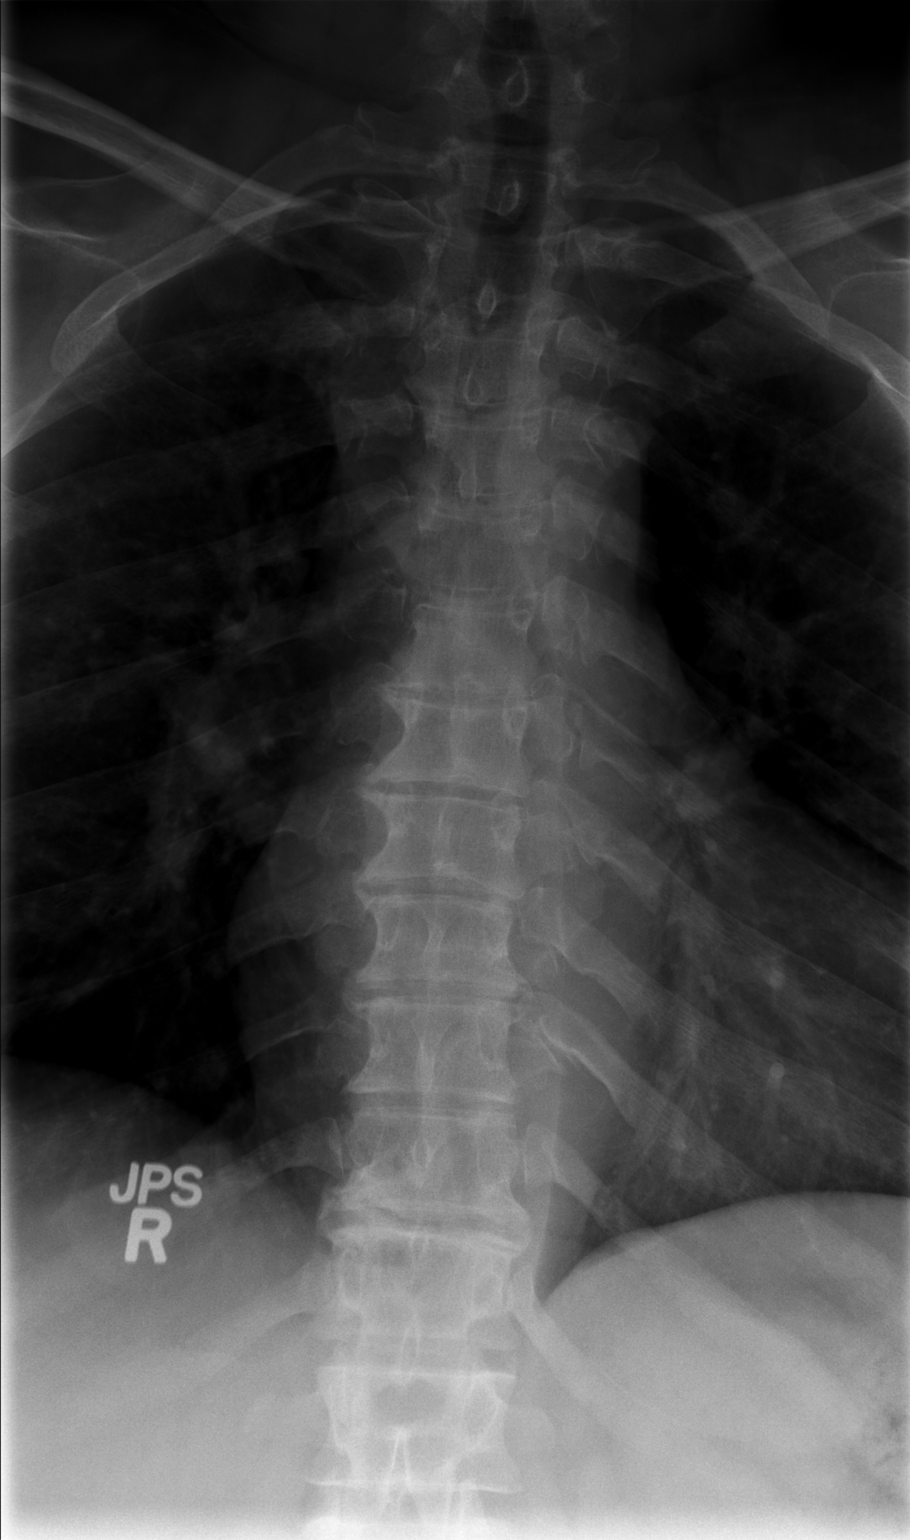

[t thoracic breathing lat]
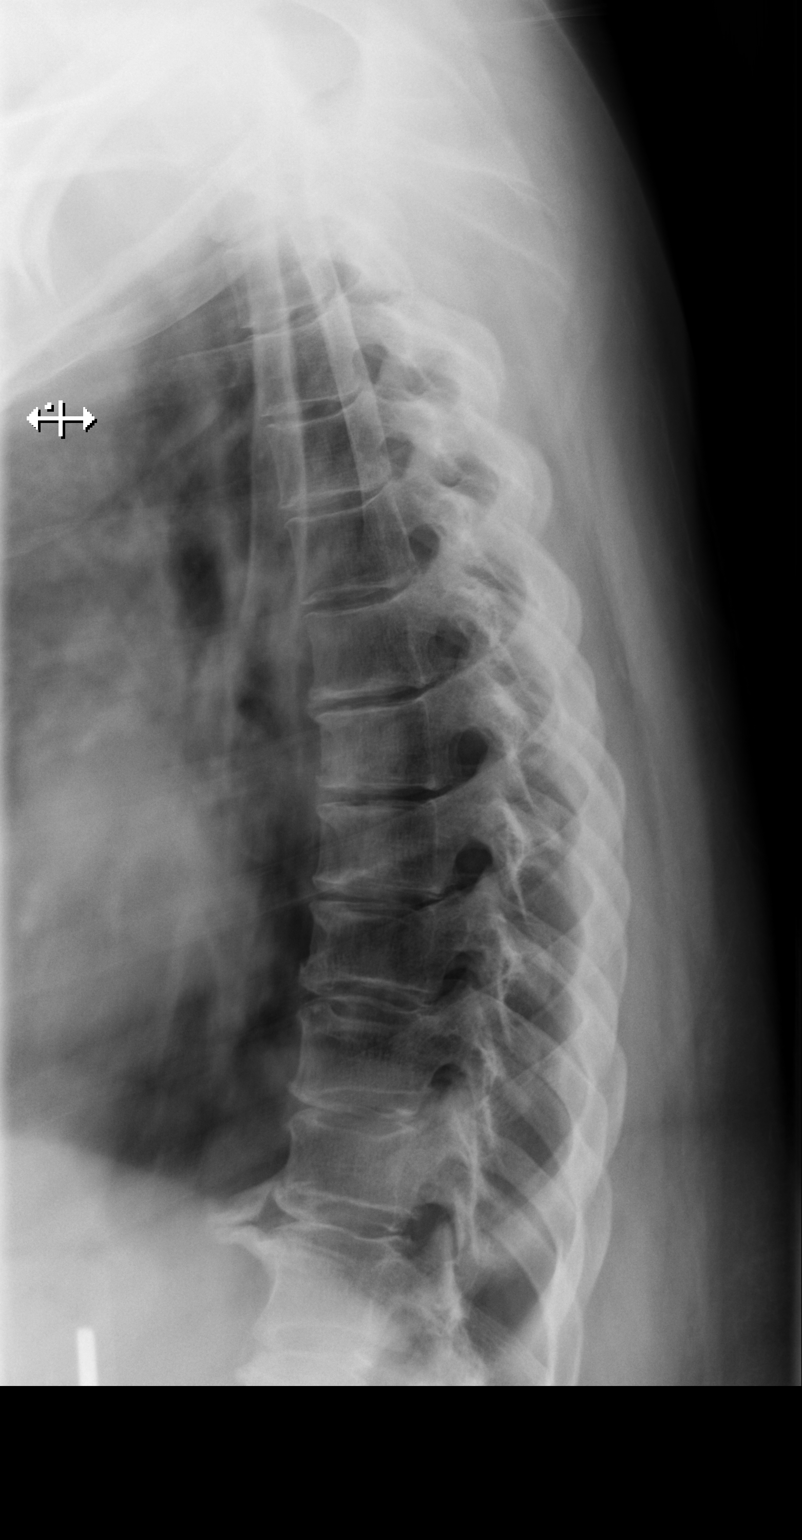

[t thoracic swimmers]
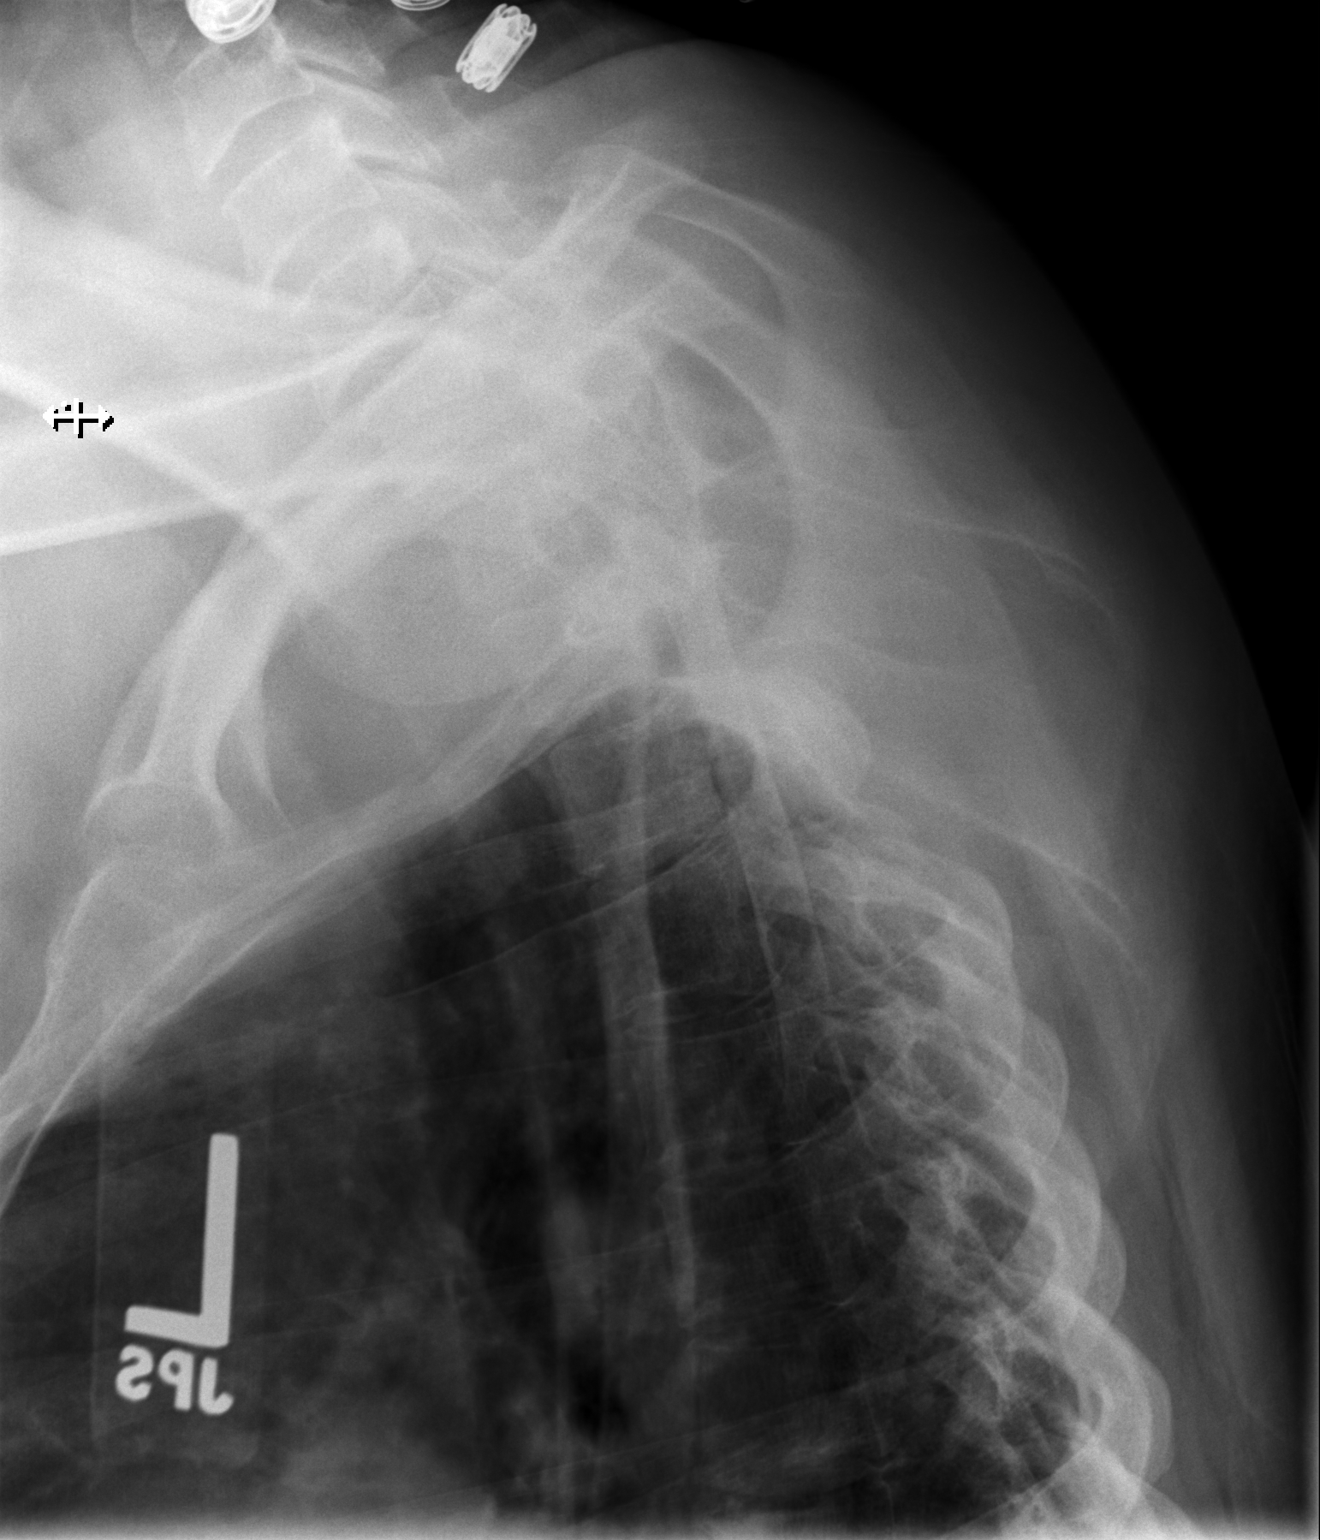

[3 of 3 positions shown; findings below may reference images not displayed]

FINDINGS: Degenerative changes thoracic spine without compression
fracture noted.
IMPRESSION: Degenerative changes thoracic spine without compression fracture
noted.

## 2012-12-18 IMAGING — CR DG LUMBAR SPINE COMPLETE 4+V
5 series · 5 of 5 positions shown · non-contrast
Comparison: No comparison lumbar spine plain film examination.
Thoracic spine exam performed same date

CLINICAL DATA: Fall.  Pain.

LUMBAR SPINE - COMPLETE 4+ VIEW

[t lumbar spine ap]
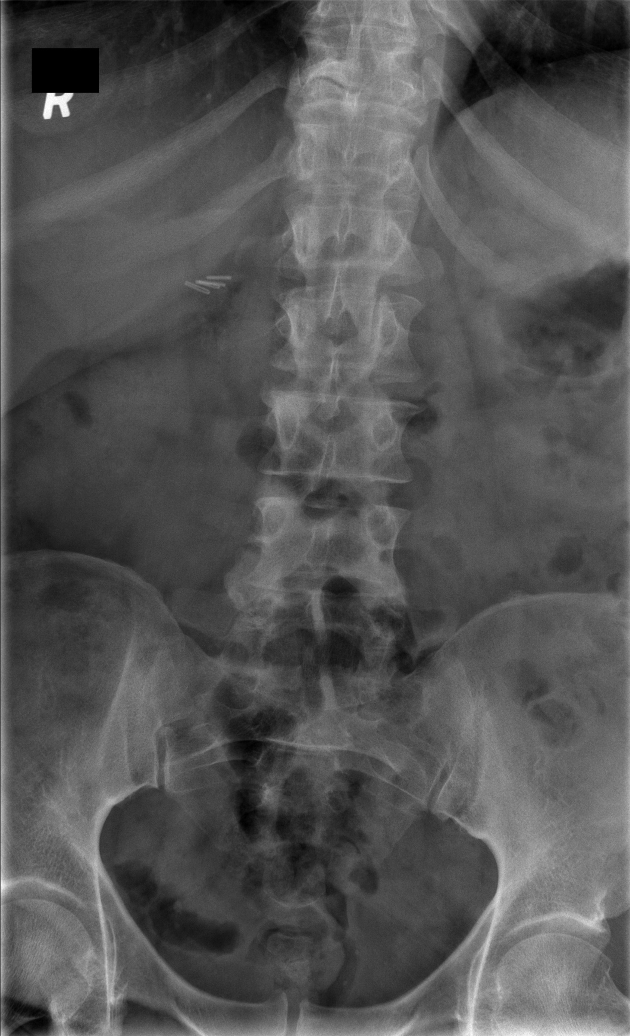

[t lumbar spine obl (1 of 2)]
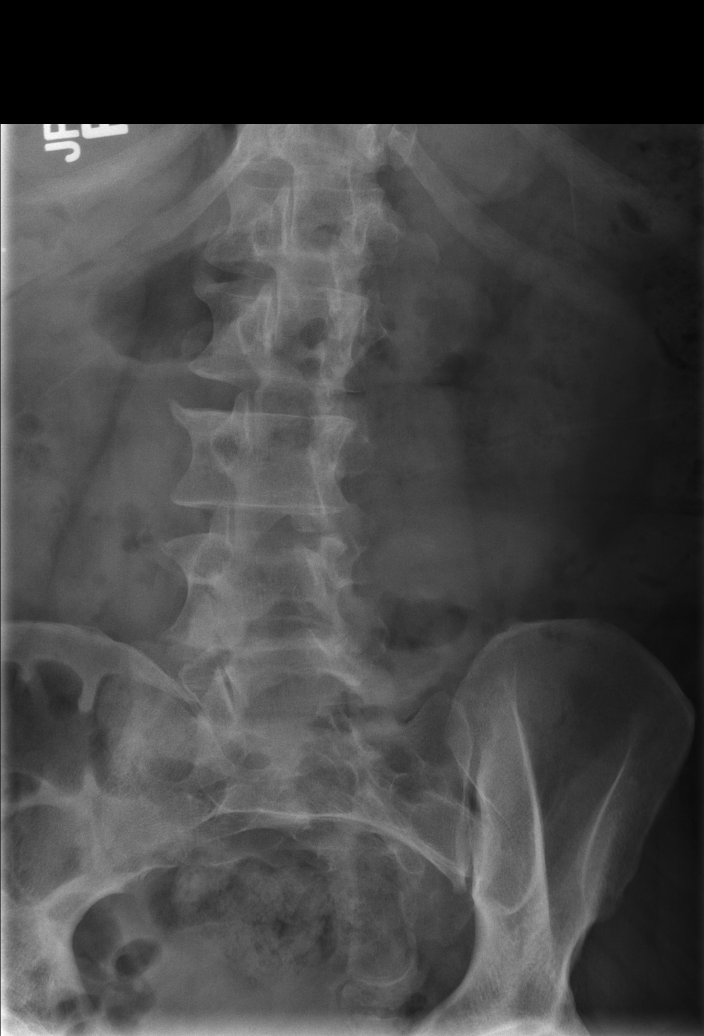

[t lumbar spine obl (2 of 2)]
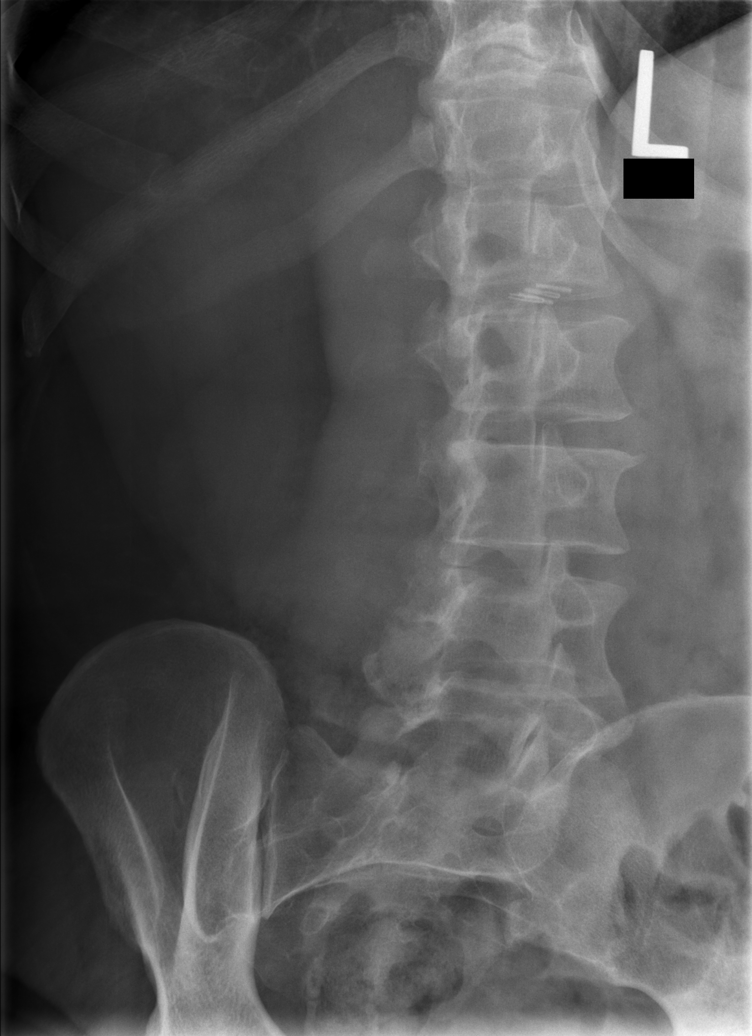

[t lumbar spine lat]
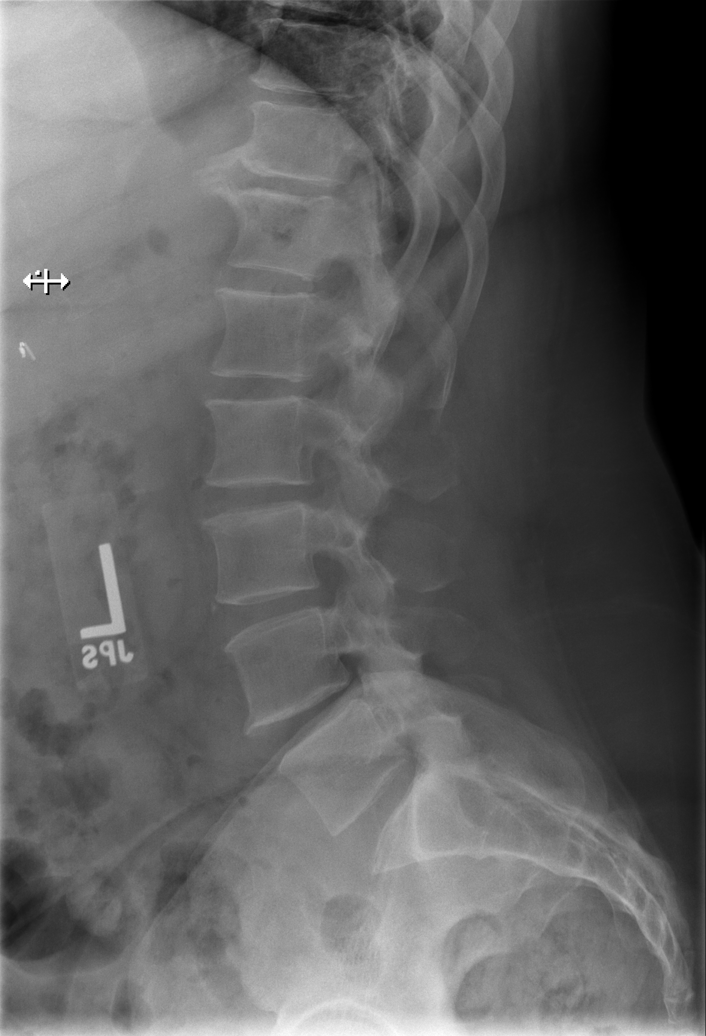

[t lumbar l-5 s-1 spot]
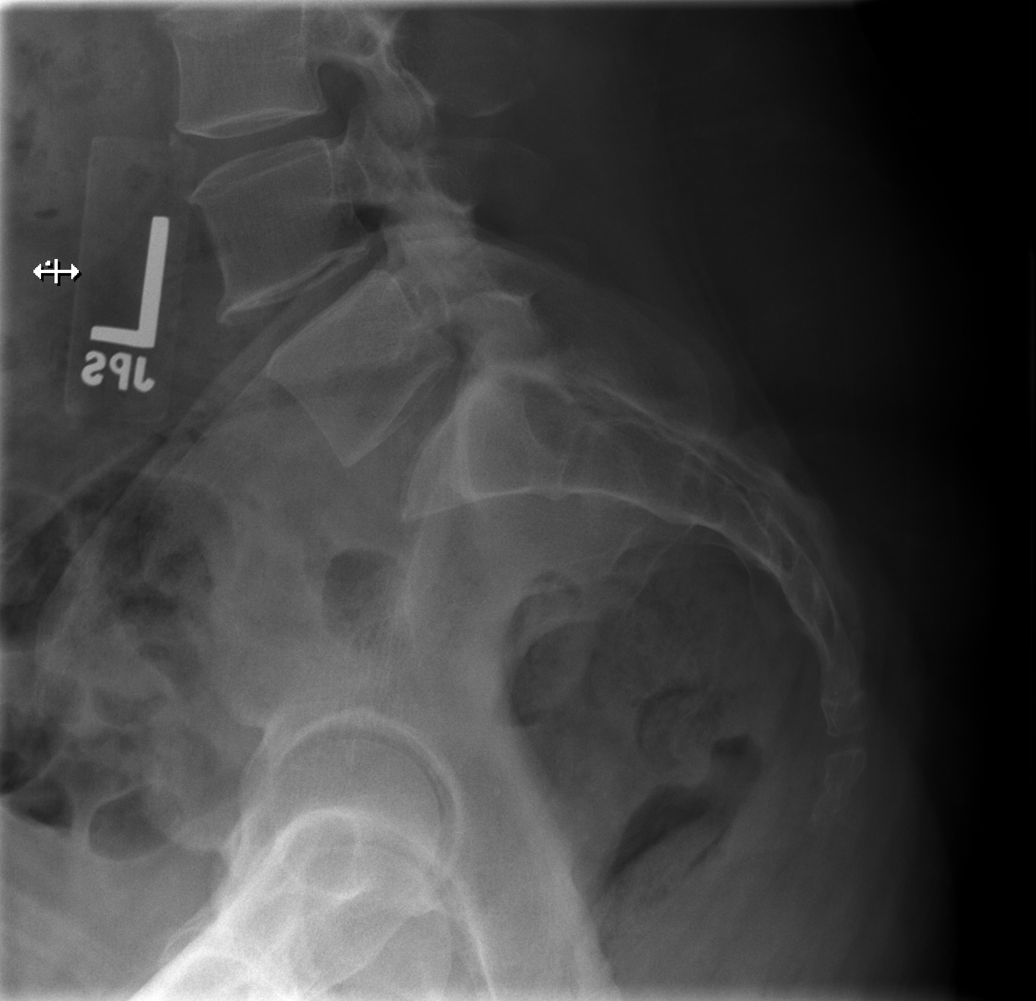

[5 of 5 positions shown; findings below may reference images not displayed]

FINDINGS: Degenerative changes most notable L4-5 level.  No
fracture or malalignment..
IMPRESSION: Degenerative changes most notable L4-5 level.

No fracture or malalignment.

## 2012-12-18 MED ORDER — CYCLOBENZAPRINE HCL 10 MG PO TABS: 10.0000 mg | ORAL_TABLET | Freq: Two times a day (BID) | ORAL | Status: DC | PRN

## 2012-12-18 MED ORDER — HYDROCODONE-ACETAMINOPHEN 5-325 MG PO TABS
1.0000 | ORAL_TABLET | Freq: Once | ORAL | Status: AC
  Administered 2012-12-18: 1 via ORAL
  Filled 2012-12-18: qty 1

## 2012-12-18 NOTE — ED Provider Notes (Signed)
History     CSN: [id number]  Arrival date & time 12/18/12  1013   First MD Initiated Contact with Patient 12/18/12 1052      Chief Complaint  Patient presents with  . Back Pain    (Consider location/radiation/quality/duration/timing/severity/associated sxs/prior treatment) HPI Comments: Patient reports she slipped and fell down 5 steps this morning at 7:30am.  Since that time she has had mild pain in her lower back and stronger pain in her mid back.  Pain is aching and sharp, 7/10, radiates into her central chest.  Associated SOB.  Nonproductive cough.  Denies any chest pain or SOB prior to fall - all symptoms began on impact.  Denies weakness or numbness of the extremities, denies abdominal pain, vomiting.   Patient is a 47 y.o. female presenting with back pain. The history is provided by the patient.  Back Pain Associated symptoms: chest pain   Associated symptoms: no abdominal pain and no headaches     Past Medical History  Diagnosis Date  . Type 2 diabetes mellitus   . Essential hypertension, benign   . GERD (gastroesophageal reflux disease)   . COPD (chronic obstructive pulmonary disease)   . Arthritis   . Normal coronary arteries     3/10 - following abnormal Myoview  . Diabetes mellitus   . CHF (congestive heart failure)     Past Surgical History  Procedure Laterality Date  . Tubal ligation    . Cholecystectomy    . Cesarean section    . Abdominal hysterectomy  09/10/2011    Procedure: HYSTERECTOMY ABDOMINAL;  Surgeon: Tilda Burrow, MD;  Location: [facility];  Service: Gynecology;  Laterality: N/A;  Abdominal hysterectomy  . Scar revision  09/10/2011    Procedure: SCAR REVISION;  Surgeon: Tilda Burrow, MD;  Location: [facility];  Service: Gynecology;  Laterality: N/A;  Wide Excision of old Cicatrix    Family History  Problem Relation Age of Onset  . Cirrhosis Mother   . Diabetes type II Father   . Diabetes type II Sister   . Anesthesia problems Neg Hx   .  Hypotension Neg Hx   . Malignant hyperthermia Neg Hx   . Pseudochol deficiency Neg Hx     History  Substance Use Topics  . Smoking status: Current Every Day Smoker -- 0.25 packs/day for 29 years    Types: Cigarettes  . Smokeless tobacco: Never Used     Comment: 1 cigarette a day  . Alcohol Use: 6.0 oz/week    10 Cans of beer per week     Comment: 4-5 beers on friday night and 4-5 beers on saturday night    OB History   Grav Para Term Preterm Abortions TAB SAB Ect Mult Living                  Review of Systems  HENT: Negative for neck pain.   Respiratory: Positive for cough and shortness of breath.   Cardiovascular: Positive for chest pain.  Gastrointestinal: Negative for nausea, vomiting and abdominal pain.  Musculoskeletal: Positive for back pain.  Neurological: Negative for syncope and headaches.    Allergies  Bee venom and Naproxen  Home Medications   Current Outpatient Rx  Name  Route  Sig  Dispense  Refill  . albuterol (PROVENTIL HFA;VENTOLIN HFA) 108 (90 BASE) MCG/ACT inhaler   Inhalation   Inhale 1-2 puffs into the lungs every 6 (six) hours as needed for wheezing.   1 Inhaler  0   . aspirin EC 81 MG tablet   Oral   Take 81 mg by mouth daily.         Marland Kitchen aspirin-acetaminophen-caffeine (EXCEDRIN MIGRAINE) 250-250-65 MG per tablet   Oral   Take 2 tablets by mouth every 6 (six) hours as needed for pain.         Marland Kitchen enalapril (VASOTEC) 20 MG tablet   Oral   Take 20 mg by mouth daily.         Marland Kitchen glimepiride (AMARYL) 4 MG tablet   Oral   Take 4 mg by mouth daily before breakfast.           . hydrochlorothiazide (HYDRODIURIL) 25 MG tablet   Oral   Take 25 mg by mouth daily.           . metFORMIN (GLUCOPHAGE) 1000 MG tablet   Oral   Take 1,000 mg by mouth 2 (two) times daily with a meal.           . naproxen sodium (ANAPROX) 220 MG tablet   Oral   Take 440 mg by mouth 2 (two) times daily as needed (for pain).         Marland Kitchen omeprazole  (PRILOSEC) 20 MG capsule   Oral   Take 20 mg by mouth daily.           BP 190/90  Pulse 70  Temp(Src) 98 F (36.7 C) (Oral)  Resp 18  SpO2 99%  LMP 08/21/2011  Physical Exam  Nursing note and vitals reviewed. Constitutional: She appears well-developed and well-nourished. No distress.  HENT:  Head: Normocephalic and atraumatic.  Neck: Neck supple.  Cardiovascular: Normal rate and regular rhythm.   Pulmonary/Chest: Effort normal and breath sounds normal.  Abdominal: There is no tenderness.  Musculoskeletal:       Cervical back: Normal.       Back:  Neurological: She is alert.  Skin: She is not diaphoretic.    ED Course  Procedures (including critical care time)  Labs Reviewed - No data to display Dg Thoracic Spine 2 View  12/18/2012   *RADIOLOGY REPORT*  Clinical Data: Fall.  Back pain.  THORACIC SPINE - 2 VIEW  Comparison: Lumbar spine plain films same date.  08/25/2012 chest x- ray.  Findings: Degenerative changes thoracic spine without compression fracture noted.  IMPRESSION: Degenerative changes thoracic spine without compression fracture noted.   Original Report Authenticated By: Lacy Duverney, M.D.   Dg Lumbar Spine Complete  12/18/2012   *RADIOLOGY REPORT*  Clinical Data: Fall.  Pain.  LUMBAR SPINE - COMPLETE 4+ VIEW  Comparison: No comparison lumbar spine plain film examination. Thoracic spine exam performed same date  Findings: Degenerative changes most notable L4-5 level.  No fracture or malalignment.  IMPRESSION: Degenerative changes most notable L4-5 level.  No fracture or malalignment.   Original Report Authenticated By: Lacy Duverney, M.D.     1. Fall (on)(from) incline, initial encounter   2. Back pain       MDM  Pt with back pain that began upon impact after mechanical fall down stairs.  Neurovascularly intact.  No red flags.  Discussed all results with patient.  Pt given return precautions.  Pt verbalizes understanding and agrees with plan.           Trixie Dredge, PA-C 12/18/12 1313

## 2012-12-18 NOTE — ED Notes (Signed)
Pt reports she slipped down her steps this morning and hit her tailbone. Reports pain in the lower back that radiates up and around to her ribs. Denies any LOC or hitting her head.

## 2012-12-19 NOTE — ED Provider Notes (Signed)
Medical screening examination/treatment/procedure(s) were performed by non-physician practitioner and as supervising physician I was immediately available for consultation/collaboration.   Lyrick Lagrand M Flavius Repsher, DO 12/19/12 1243 

## 2013-02-14 ENCOUNTER — Emergency Department (HOSPITAL_COMMUNITY)
Admission: EM | Admit: 2013-02-14 | Discharge: 2013-02-14 | Disposition: A | Discharge status: Home / Self Care | Payer: Self-pay | Attending: Emergency Medicine | Admitting: Emergency Medicine

## 2013-02-14 ENCOUNTER — Encounter (HOSPITAL_COMMUNITY): Payer: Self-pay | Admitting: *Deleted

## 2013-02-14 ENCOUNTER — Emergency Department (HOSPITAL_COMMUNITY): Payer: Self-pay

## 2013-02-14 DIAGNOSIS — K219 Gastro-esophageal reflux disease without esophagitis: Secondary | ICD-10-CM | POA: Insufficient documentation

## 2013-02-14 DIAGNOSIS — M129 Arthropathy, unspecified: Secondary | ICD-10-CM | POA: Insufficient documentation

## 2013-02-14 DIAGNOSIS — Z9851 Tubal ligation status: Secondary | ICD-10-CM | POA: Insufficient documentation

## 2013-02-14 DIAGNOSIS — Z9889 Other specified postprocedural states: Secondary | ICD-10-CM | POA: Insufficient documentation

## 2013-02-14 DIAGNOSIS — Z79899 Other long term (current) drug therapy: Secondary | ICD-10-CM | POA: Insufficient documentation

## 2013-02-14 DIAGNOSIS — I10 Essential (primary) hypertension: Secondary | ICD-10-CM | POA: Insufficient documentation

## 2013-02-14 DIAGNOSIS — Z90710 Acquired absence of both cervix and uterus: Secondary | ICD-10-CM | POA: Insufficient documentation

## 2013-02-14 DIAGNOSIS — R059 Cough, unspecified: Secondary | ICD-10-CM | POA: Insufficient documentation

## 2013-02-14 DIAGNOSIS — F17200 Nicotine dependence, unspecified, uncomplicated: Secondary | ICD-10-CM | POA: Insufficient documentation

## 2013-02-14 DIAGNOSIS — Z7982 Long term (current) use of aspirin: Secondary | ICD-10-CM | POA: Insufficient documentation

## 2013-02-14 DIAGNOSIS — J441 Chronic obstructive pulmonary disease with (acute) exacerbation: Secondary | ICD-10-CM | POA: Insufficient documentation

## 2013-02-14 DIAGNOSIS — I509 Heart failure, unspecified: Secondary | ICD-10-CM | POA: Insufficient documentation

## 2013-02-14 DIAGNOSIS — R05 Cough: Secondary | ICD-10-CM | POA: Insufficient documentation

## 2013-02-14 DIAGNOSIS — R109 Unspecified abdominal pain: Secondary | ICD-10-CM | POA: Insufficient documentation

## 2013-02-14 DIAGNOSIS — IMO0002 Reserved for concepts with insufficient information to code with codable children: Secondary | ICD-10-CM | POA: Insufficient documentation

## 2013-02-14 DIAGNOSIS — R071 Chest pain on breathing: Secondary | ICD-10-CM | POA: Insufficient documentation

## 2013-02-14 DIAGNOSIS — J069 Acute upper respiratory infection, unspecified: Secondary | ICD-10-CM | POA: Insufficient documentation

## 2013-02-14 DIAGNOSIS — E119 Type 2 diabetes mellitus without complications: Secondary | ICD-10-CM | POA: Insufficient documentation

## 2013-02-14 LAB — CBC WITH DIFFERENTIAL/PLATELET
Basophils Absolute: 0 10*3/uL (ref 0.0–0.1)
Basophils Relative: 1 % (ref 0–1)
Eosinophils Absolute: 0.3 10*3/uL (ref 0.0–0.7)
Eosinophils Relative: 6 % — ABNORMAL HIGH (ref 0–5)
HCT: 40.9 % (ref 36.0–46.0)
Hemoglobin: 14 g/dL (ref 12.0–15.0)
Lymphocytes Relative: 53 % — ABNORMAL HIGH (ref 12–46)
Lymphs Abs: 2.6 10*3/uL (ref 0.7–4.0)
MCH: 29.4 pg (ref 26.0–34.0)
MCHC: 34.2 g/dL (ref 30.0–36.0)
MCV: 85.9 fL (ref 78.0–100.0)
Monocytes Absolute: 0.4 10*3/uL (ref 0.1–1.0)
Monocytes Relative: 8 % (ref 3–12)
Neutro Abs: 1.7 10*3/uL (ref 1.7–7.7)
Neutrophils Relative %: 33 % — ABNORMAL LOW (ref 43–77)
Platelets: 228 10*3/uL (ref 150–400)
RBC: 4.76 MIL/uL (ref 3.87–5.11)
RDW: 12.5 % (ref 11.5–15.5)
WBC: 5 10*3/uL (ref 4.0–10.5)

## 2013-02-14 LAB — BASIC METABOLIC PANEL
BUN: 8 mg/dL (ref 6–23)
CO2: 28 mEq/L (ref 19–32)
Calcium: 9.1 mg/dL (ref 8.4–10.5)
Chloride: 101 mEq/L (ref 96–112)
Creatinine, Ser: 0.85 mg/dL (ref 0.50–1.10)
GFR calc Af Amer: >90 mL/min (ref >90)
GFR calc non Af Amer: 81 mL/min — ABNORMAL LOW (ref >90)
Glucose, Bld: 144 mg/dL — ABNORMAL HIGH (ref 70–99)
Potassium: 3.5 mEq/L (ref 3.5–5.1)
Sodium: 136 mEq/L (ref 135–145)

## 2013-02-14 LAB — TROPONIN I: Troponin I: <0.3 ng/mL (ref <0.30)

## 2013-02-14 MED ORDER — ALBUTEROL SULFATE HFA 108 (90 BASE) MCG/ACT IN AERS
2.0000 | INHALATION_SPRAY | RESPIRATORY_TRACT | Status: DC | PRN
  Administered 2013-02-14: 2 via RESPIRATORY_TRACT
  Filled 2013-02-14: qty 6.7

## 2013-02-14 MED ORDER — PREDNISONE 20 MG PO TABS: 40.0000 mg | ORAL_TABLET | Freq: Every day | ORAL | Status: DC

## 2013-02-14 MED ORDER — HYDROCODONE-ACETAMINOPHEN 5-325 MG PO TABS: 1.0000 | ORAL_TABLET | ORAL | Status: DC | PRN

## 2013-02-14 MED ORDER — PREDNISONE 50 MG PO TABS
60.0000 mg | ORAL_TABLET | Freq: Once | ORAL | Status: AC
  Administered 2013-02-14: 60 mg via ORAL
  Filled 2013-02-14: qty 1

## 2013-02-14 NOTE — ED Notes (Signed)
Pt alert & oriented x4, stable gait. Patient given discharge instructions, paperwork & prescription(s). Patient  instructed to stop at the registration desk to finish any additional paperwork. Patient verbalized understanding. Pt left department w/ no further questions. 

## 2013-02-14 NOTE — ED Notes (Signed)
Pt c/o mid center chest pain that is worse with breathing, palpation, movement, admits to cough that is productive with yellow sputum and chills, pain started this am, has hx of bronchitis in the past and states that the pain is similar to what she had with the bronchitis.

## 2013-02-14 NOTE — ED Notes (Signed)
Pt updated on delay

## 2013-02-14 NOTE — ED Provider Notes (Signed)
History    CSN: [id number] Arrival date & time 02/14/13  1642  First MD Initiated Contact with Patient 02/14/13 1849     Chief Complaint  Patient presents with  . Pleurisy   (Consider location/radiation/quality/duration/timing/severity/associated sxs/prior Treatment) The history is provided by the patient.   patient states that she has had cough and pain in her anterior chest and abdomen for the last day or 2. States she's had a little sputum coming up. She states sometimes she feels as if her abdomen gets larger when she coughs. No fevers. She states she is out of her inhaler. No vomiting or diarrhea. No lightheadedness or dizziness. She states in the past when his head these episodes she has had steroids, inhaler, and hydrocodone. Past Medical History  Diagnosis Date  . Type 2 diabetes mellitus   . Essential hypertension, benign   . GERD (gastroesophageal reflux disease)   . COPD (chronic obstructive pulmonary disease)   . Arthritis   . Normal coronary arteries     3/10 - following abnormal Myoview  . Diabetes mellitus   . CHF (congestive heart failure)    Past Surgical History  Procedure Laterality Date  . Tubal ligation    . Cholecystectomy    . Cesarean section    . Abdominal hysterectomy  09/10/2011    Procedure: HYSTERECTOMY ABDOMINAL;  Surgeon: Tilda Burrow, MD;  Location: [facility];  Service: Gynecology;  Laterality: N/A;  Abdominal hysterectomy  . Scar revision  09/10/2011    Procedure: SCAR REVISION;  Surgeon: Tilda Burrow, MD;  Location: [facility];  Service: Gynecology;  Laterality: N/A;  Wide Excision of old Cicatrix   Family History  Problem Relation Age of Onset  . Cirrhosis Mother   . Diabetes type II Father   . Diabetes type II Sister   . Anesthesia problems Neg Hx   . Hypotension Neg Hx   . Malignant hyperthermia Neg Hx   . Pseudochol deficiency Neg Hx    History  Substance Use Topics  . Smoking status: Current Every Day Smoker -- 0.25 packs/day for 29  years    Types: Cigarettes  . Smokeless tobacco: Never Used     Comment: 1 cigarette a day  . Alcohol Use: 6.0 oz/week    10 Cans of beer per week     Comment: 4-5 beers on friday night and 4-5 beers on saturday night   OB History   Grav Para Term Preterm Abortions TAB SAB Ect Mult Living                 Review of Systems  Constitutional: Negative for activity change and appetite change.  HENT: Negative for neck stiffness.   Eyes: Negative for pain.  Respiratory: Positive for cough. Negative for chest tightness and shortness of breath.   Cardiovascular: Negative for chest pain and leg swelling.  Gastrointestinal: Positive for abdominal pain. Negative for nausea, vomiting and diarrhea.  Genitourinary: Negative for flank pain.  Musculoskeletal: Negative for back pain and joint swelling.  Skin: Negative for rash.  Neurological: Negative for weakness, numbness and headaches.  Psychiatric/Behavioral: Negative for behavioral problems.    Allergies  Bee venom and Naproxen  Home Medications   Current Outpatient Rx  Name  Route  Sig  Dispense  Refill  . aspirin EC 81 MG tablet   Oral   Take 81 mg by mouth daily.         . enalapril (VASOTEC) 20 MG tablet   Oral  Take 20 mg by mouth daily.         Marland Kitchen glimepiride (AMARYL) 4 MG tablet   Oral   Take 4 mg by mouth daily before breakfast.           . hydrochlorothiazide (HYDRODIURIL) 25 MG tablet   Oral   Take 25 mg by mouth daily.           Marland Kitchen ibuprofen (ADVIL,MOTRIN) 200 MG tablet   Oral   Take 800 mg by mouth every 6 (six) hours as needed for pain.         . metFORMIN (GLUCOPHAGE) 1000 MG tablet   Oral   Take 1,000 mg by mouth 2 (two) times daily with a meal.           . naproxen sodium (ANAPROX) 220 MG tablet   Oral   Take 440 mg by mouth 2 (two) times daily as needed (for pain).         Marland Kitchen omeprazole (PRILOSEC) 20 MG capsule   Oral   Take 20 mg by mouth daily.         Marland Kitchen albuterol (PROVENTIL  HFA;VENTOLIN HFA) 108 (90 BASE) MCG/ACT inhaler   Inhalation   Inhale 1-2 puffs into the lungs every 6 (six) hours as needed for wheezing.   1 Inhaler   0   . HYDROcodone-acetaminophen (NORCO/VICODIN) 5-325 MG per tablet   Oral   Take 1 tablet by mouth every 4 (four) hours as needed for pain.   8 tablet   0   . predniSONE (DELTASONE) 20 MG tablet   Oral   Take 2 tablets (40 mg total) by mouth daily.   4 tablet   0    BP 186/96  Pulse 72  Temp(Src) 97.8 F (36.6 C) (Oral)  Resp 20  SpO2 98%  LMP 08/21/2011 Physical Exam  Nursing note and vitals reviewed. Constitutional: She is oriented to [place], place, [name]  HENT:  Head: Normocephalic and atraumatic.  Eyes: EOM are normal. Pupils are equal, round, and reactive to light.  Neck: Normal range of motion. Neck supple.  Cardiovascular: Normal rate, regular rhythm and normal heart sounds.   No murmur heard. Pulmonary/Chest: Effort normal. No respiratory distress. She has wheezes. She has no rales.  Scattered wheezes.  Abdominal: Soft. Bowel sounds are normal. She exhibits no distension. There is tenderness. There is no rebound and no guarding.  Mild epigastric tenderness.   Musculoskeletal: Normal range of motion. She exhibits edema.  Trace bilateral lower extremity pitting edema  Neurological: She is alert and oriented to [place], place, and time. No cranial nerve deficit.  Skin: Skin is warm and dry.  Psychiatric: She has a normal mood and affect. Her speech is normal.    ED Course  Procedures (including critical care time) Labs Reviewed  CBC WITH DIFFERENTIAL - Abnormal; Notable for the following:    Neutrophils Relative % 33 (*)    Lymphocytes Relative 53 (*)    Eosinophils Relative 6 (*)    All other components within normal limits  BASIC METABOLIC PANEL - Abnormal; Notable for the following:    Glucose, Bld 144 (*)    GFR calc non Af Amer 81 (*)    All other  components within normal limits  TROPONIN I   Dg Chest 2 View  02/14/2013   *RADIOLOGY REPORT*  Clinical Data: Pleurisy  CHEST - 2 VIEW  Comparison: 08/25/2012  Findings: Cardiac enlargement without heart  failure.  Lungs are clear without infiltrate or effusion.  No mass lesion is identified.  IMPRESSION: Cardiac enlargement.  No acute abnormality and no change from the prior study.   Original Report Authenticated By: Janeece Riggers, M.D.   1. URI (upper respiratory infection)   2. Abdominal pain     MDM  Patient cough and shortness of breath. Some mild sputum. Maybe URI. Lab works reassuring. EKG is reassuring. X-ray does not show pneumonia. Mild epigastric tenderness may need further followup. She she's previously had a gallbladder removed. She will be discharged home  Juliet Rude. Rubin Payor, MD 02/14/13 2133

## 2013-02-22 ENCOUNTER — Emergency Department (HOSPITAL_COMMUNITY)
Admission: EM | Admit: 2013-02-22 | Discharge: 2013-02-22 | Disposition: A | Discharge status: Home / Self Care | Payer: Self-pay | Attending: Emergency Medicine | Admitting: Emergency Medicine

## 2013-02-22 ENCOUNTER — Encounter (HOSPITAL_COMMUNITY): Payer: Self-pay

## 2013-02-22 DIAGNOSIS — F17200 Nicotine dependence, unspecified, uncomplicated: Secondary | ICD-10-CM | POA: Insufficient documentation

## 2013-02-22 DIAGNOSIS — I10 Essential (primary) hypertension: Secondary | ICD-10-CM | POA: Insufficient documentation

## 2013-02-22 DIAGNOSIS — K219 Gastro-esophageal reflux disease without esophagitis: Secondary | ICD-10-CM | POA: Insufficient documentation

## 2013-02-22 DIAGNOSIS — M129 Arthropathy, unspecified: Secondary | ICD-10-CM | POA: Insufficient documentation

## 2013-02-22 DIAGNOSIS — R220 Localized swelling, mass and lump, head: Secondary | ICD-10-CM | POA: Insufficient documentation

## 2013-02-22 DIAGNOSIS — R112 Nausea with vomiting, unspecified: Secondary | ICD-10-CM | POA: Insufficient documentation

## 2013-02-22 DIAGNOSIS — J449 Chronic obstructive pulmonary disease, unspecified: Secondary | ICD-10-CM | POA: Insufficient documentation

## 2013-02-22 DIAGNOSIS — I509 Heart failure, unspecified: Secondary | ICD-10-CM | POA: Insufficient documentation

## 2013-02-22 DIAGNOSIS — J4489 Other specified chronic obstructive pulmonary disease: Secondary | ICD-10-CM | POA: Insufficient documentation

## 2013-02-22 DIAGNOSIS — Z79899 Other long term (current) drug therapy: Secondary | ICD-10-CM | POA: Insufficient documentation

## 2013-02-22 DIAGNOSIS — Z7982 Long term (current) use of aspirin: Secondary | ICD-10-CM | POA: Insufficient documentation

## 2013-02-22 DIAGNOSIS — E119 Type 2 diabetes mellitus without complications: Secondary | ICD-10-CM | POA: Insufficient documentation

## 2013-02-22 DIAGNOSIS — K047 Periapical abscess without sinus: Secondary | ICD-10-CM | POA: Insufficient documentation

## 2013-02-22 MED ORDER — HYDROCODONE-ACETAMINOPHEN 5-325 MG PO TABS: 2.0000 | ORAL_TABLET | ORAL | Status: DC | PRN

## 2013-02-22 MED ORDER — AMOXICILLIN-POT CLAVULANATE 875-125 MG PO TABS: 1.0000 | ORAL_TABLET | Freq: Two times a day (BID) | ORAL | Status: DC

## 2013-02-22 MED ORDER — AMOXICILLIN 250 MG PO CAPS
500.0000 mg | ORAL_CAPSULE | Freq: Once | ORAL | Status: AC
  Administered 2013-02-22: 500 mg via ORAL
  Filled 2013-02-22: qty 2

## 2013-02-22 MED ORDER — HYDROMORPHONE HCL PF 1 MG/ML IJ SOLN
1.0000 mg | Freq: Once | INTRAMUSCULAR | Status: AC
  Administered 2013-02-22: 1 mg via INTRAMUSCULAR
  Filled 2013-02-22: qty 1

## 2013-02-22 NOTE — ED Notes (Signed)
Pt c/o toothache in r jaw that started last night.  PT reports woke up this am with swelling to r side of face.

## 2013-02-22 NOTE — ED Provider Notes (Signed)
History    This chart was scribed for Robin Roller, MD, by Yevette Edwards, ED Scribe. This patient was seen in room [facility] and the patient's care was started at 8:05 AM.  CSN: [id number] Arrival date & time 02/22/13  0754  First MD Initiated Contact with Patient 02/22/13 [phone]     Chief Complaint  Patient presents with  . Dental Pain    The history is provided by the patient. No language interpreter was used.   HPI Comments: Robin [name] is a 47 y.o. female who presents to the Emergency Department complaining of dental pain to the upper right maxillary region. She states that the pain increased this morning, and she noticed that the right side of her face was swollen. The pt  states she lost a filling a "long time ago". She also states that she has experienced nausea. She denies experiencing any emesis or fever. She attempted to treat the dental pain with ibuprofen, but without resolution.  The pt has a h/o of DM and HTN.  Past Medical History  Diagnosis Date  . Type 2 diabetes mellitus   . Essential hypertension, benign   . GERD (gastroesophageal reflux disease)   . COPD (chronic obstructive pulmonary disease)   . Arthritis   . Normal coronary arteries     3/10 - following abnormal Myoview  . Diabetes mellitus   . CHF (congestive heart failure)    Past Surgical History  Procedure Laterality Date  . Tubal ligation    . Cholecystectomy    . Cesarean section    . Abdominal hysterectomy  09/10/2011    Procedure: HYSTERECTOMY ABDOMINAL;  Surgeon: Tilda Burrow, MD;  Location: [facility];  Service: Gynecology;  Laterality: N/A;  Abdominal hysterectomy  . Scar revision  09/10/2011    Procedure: SCAR REVISION;  Surgeon: Tilda Burrow, MD;  Location: [facility];  Service: Gynecology;  Laterality: N/A;  Wide Excision of old Cicatrix   Family History  Problem Relation Age of Onset  . Cirrhosis Mother   . Diabetes type II Father   . Diabetes type II Sister   . Anesthesia problems  Neg Hx   . Hypotension Neg Hx   . Malignant hyperthermia Neg Hx   . Pseudochol deficiency Neg Hx    History  Substance Use Topics  . Smoking status: Current Every Day Smoker -- 0.25 packs/day for 29 years    Types: Cigarettes  . Smokeless tobacco: Never Used     Comment: 1 cigarette a day  . Alcohol Use: 0.0 oz/week     Comment: occ   No OB history.  Review of Systems  Constitutional: Negative for fever.  HENT: Positive for dental problem.   Gastrointestinal: Positive for vomiting. Negative for nausea and abdominal pain.  All other systems reviewed and are negative.    Allergies  Bee venom and Naproxen  Home Medications   Current Outpatient Rx  Name  Route  Sig  Dispense  Refill  . albuterol (PROVENTIL HFA;VENTOLIN HFA) 108 (90 BASE) MCG/ACT inhaler   Inhalation   Inhale 1-2 puffs into the lungs every 6 (six) hours as needed for wheezing.   1 Inhaler   0   . amoxicillin-clavulanate (AUGMENTIN) 875-125 MG per tablet   Oral   Take 1 tablet by mouth every 12 (twelve) hours.   14 tablet   0   . aspirin EC 81 MG tablet   Oral   Take 81 mg by mouth daily.         Marland Kitchen  enalapril (VASOTEC) 20 MG tablet   Oral   Take 20 mg by mouth daily.         Marland Kitchen glimepiride (AMARYL) 4 MG tablet   Oral   Take 4 mg by mouth daily before breakfast.           . hydrochlorothiazide (HYDRODIURIL) 25 MG tablet   Oral   Take 25 mg by mouth daily.           Marland Kitchen HYDROcodone-acetaminophen (NORCO/VICODIN) 5-325 MG per tablet   Oral   Take 1 tablet by mouth every 4 (four) hours as needed for pain.   8 tablet   0   . HYDROcodone-acetaminophen (NORCO/VICODIN) 5-325 MG per tablet   Oral   Take 2 tablets by mouth every 4 (four) hours as needed for pain.   10 tablet   0   . ibuprofen (ADVIL,MOTRIN) 200 MG tablet   Oral   Take 800 mg by mouth every 6 (six) hours as needed for pain.         . metFORMIN (GLUCOPHAGE) 1000 MG tablet   Oral   Take 1,000 mg by mouth 2 (two) times  daily with a meal.           . naproxen sodium (ANAPROX) 220 MG tablet   Oral   Take 440 mg by mouth 2 (two) times daily as needed (for pain).         Marland Kitchen omeprazole (PRILOSEC) 20 MG capsule   Oral   Take 20 mg by mouth daily.         . predniSONE (DELTASONE) 20 MG tablet   Oral   Take 2 tablets (40 mg total) by mouth daily.   4 tablet   0    Triage Vitals: BP 202/104  Pulse 80  Temp(Src) 98.4 F (36.9 C) (Oral)  Resp 18  Ht 5\' 6"  (1.676 m)  Wt 215 lb (97.523 kg)  BMI 34.72 kg/m2  SpO2 99%  LMP 08/21/2011  Physical Exam  Nursing note and vitals reviewed. Constitutional: She appears well-developed and well-nourished. No distress.  HENT:  Head: Normocephalic and atraumatic.  Mouth/Throat: Oropharynx is clear and moist. No oropharyngeal exudate.  Right upper maxilla is swollen along gum line with asymmetry but no fluctuant abscess.  General poor dentition. Normal phonation. No trismus. No tenderness under the tongue.   Eyes: Conjunctivae and EOM are normal. Pupils are equal, round, and reactive to light. Right eye exhibits no discharge. Left eye exhibits no discharge. No scleral icterus.  Neck: Normal range of motion. Neck supple. No JVD present. No thyromegaly present.  No adenopathy. Supple neck.  No torticollis.   Cardiovascular: Normal rate, regular rhythm, normal heart sounds and intact distal pulses.  Exam reveals no gallop and no friction rub.   No murmur heard. Pulmonary/Chest: Effort normal and breath sounds normal. No respiratory distress. She has no wheezes. She has no rales.  Abdominal: Soft. Bowel sounds are normal. She exhibits no distension and no mass. There is no tenderness.  Musculoskeletal: Normal range of motion. She exhibits no edema and no tenderness.  Lymphadenopathy:    She has no cervical adenopathy.  Neurological: She is alert. Coordination normal.  Skin: Skin is warm and dry. No rash noted. No erythema.  Psychiatric: She has a normal mood  and affect. Her behavior is normal.    ED Course  Procedures (including critical care time)  DIAGNOSTIC STUDIES: Oxygen Saturation is 99% on room air, normal by my interpretation.  COORDINATION OF CARE:  8:08 AM-Discussed treatment plan with patient which includes antibiotics and pain medication, and the patient agreed to the plan.   Labs Reviewed - No data to display No results found. 1. Dental abscess   2. Hypertension     MDM  The patient was given antibiotics as well as pain medications, she was ambulatory with minimal difficulty, her blood pressure remained hypertensive with a systolic near 200 but her diastolic was less than 100 on discharge. She was strongly cautioned to followup for blood pressure monitoring and treatment as an outpatient. According to her medication list GERD takes enalapril, hydrochlorothiazide.  The patient does not have a drainable abscess at this time, she does not have a fever, nor did she have tachycardia. She had no trismus and no tenderness under her tongue, no signs of Ludwig's angina.  Meds given in ED:  Medications  HYDROmorphone (DILAUDID) injection 1 mg (1 mg Intramuscular Given 02/22/13 0822)  amoxicillin (AMOXIL) capsule 500 mg (500 mg Oral Given 02/22/13 1610)    Discharge Medication List as of 02/22/2013  8:28 AM    START taking these medications   Details  amoxicillin-clavulanate (AUGMENTIN) 875-125 MG per tablet Take 1 tablet by mouth every 12 (twelve) hours., Starting 02/22/2013, Until Discontinued, Print    !! HYDROcodone-acetaminophen (NORCO/VICODIN) 5-325 MG per tablet Take 2 tablets by mouth every 4 (four) hours as needed for pain., Starting 02/22/2013, Until Discontinued, Print     !! - Potential duplicate medications found. Please discuss with provider.        I personally performed the services described in this documentation, which was scribed in my presence. The recorded information has been reviewed and is accurate.       Robin Roller, MD 02/22/13 [id number]

## 2013-02-22 NOTE — ED Notes (Signed)
Patient with no complaints at this time. Respirations even and unlabored. Skin warm/dry. Discharge instructions reviewed with patient at this time. Patient given opportunity to voice concerns/ask questions. Patient discharged at this time and left Emergency Department with steady gait.   

## 2013-02-24 ENCOUNTER — Other Ambulatory Visit (HOSPITAL_COMMUNITY): Payer: Self-pay | Admitting: Nurse Practitioner

## 2013-02-24 DIAGNOSIS — Z139 Encounter for screening, unspecified: Secondary | ICD-10-CM

## 2013-03-01 ENCOUNTER — Ambulatory Visit (HOSPITAL_COMMUNITY): Payer: Self-pay

## 2013-03-08 ENCOUNTER — Ambulatory Visit (HOSPITAL_COMMUNITY): Payer: Self-pay

## 2013-06-16 ENCOUNTER — Emergency Department (HOSPITAL_COMMUNITY): Payer: Self-pay

## 2013-06-16 ENCOUNTER — Emergency Department (HOSPITAL_COMMUNITY)
Admission: EM | Admit: 2013-06-16 | Discharge: 2013-06-16 | Disposition: A | Discharge status: Home / Self Care | Payer: Self-pay | Attending: Emergency Medicine | Admitting: Emergency Medicine

## 2013-06-16 ENCOUNTER — Encounter (HOSPITAL_COMMUNITY): Payer: Self-pay | Admitting: Emergency Medicine

## 2013-06-16 DIAGNOSIS — J449 Chronic obstructive pulmonary disease, unspecified: Secondary | ICD-10-CM | POA: Insufficient documentation

## 2013-06-16 DIAGNOSIS — Y929 Unspecified place or not applicable: Secondary | ICD-10-CM | POA: Insufficient documentation

## 2013-06-16 DIAGNOSIS — Z23 Encounter for immunization: Secondary | ICD-10-CM | POA: Insufficient documentation

## 2013-06-16 DIAGNOSIS — I509 Heart failure, unspecified: Secondary | ICD-10-CM | POA: Insufficient documentation

## 2013-06-16 DIAGNOSIS — M129 Arthropathy, unspecified: Secondary | ICD-10-CM | POA: Insufficient documentation

## 2013-06-16 DIAGNOSIS — S91309A Unspecified open wound, unspecified foot, initial encounter: Secondary | ICD-10-CM | POA: Insufficient documentation

## 2013-06-16 DIAGNOSIS — K219 Gastro-esophageal reflux disease without esophagitis: Secondary | ICD-10-CM | POA: Insufficient documentation

## 2013-06-16 DIAGNOSIS — H9209 Otalgia, unspecified ear: Secondary | ICD-10-CM | POA: Insufficient documentation

## 2013-06-16 DIAGNOSIS — Y939 Activity, unspecified: Secondary | ICD-10-CM | POA: Insufficient documentation

## 2013-06-16 DIAGNOSIS — F17200 Nicotine dependence, unspecified, uncomplicated: Secondary | ICD-10-CM | POA: Insufficient documentation

## 2013-06-16 DIAGNOSIS — Z79899 Other long term (current) drug therapy: Secondary | ICD-10-CM | POA: Insufficient documentation

## 2013-06-16 DIAGNOSIS — E119 Type 2 diabetes mellitus without complications: Secondary | ICD-10-CM | POA: Insufficient documentation

## 2013-06-16 DIAGNOSIS — J4489 Other specified chronic obstructive pulmonary disease: Secondary | ICD-10-CM | POA: Insufficient documentation

## 2013-06-16 DIAGNOSIS — I10 Essential (primary) hypertension: Secondary | ICD-10-CM | POA: Insufficient documentation

## 2013-06-16 DIAGNOSIS — J029 Acute pharyngitis, unspecified: Secondary | ICD-10-CM | POA: Insufficient documentation

## 2013-06-16 DIAGNOSIS — J209 Acute bronchitis, unspecified: Secondary | ICD-10-CM | POA: Insufficient documentation

## 2013-06-16 DIAGNOSIS — W268XXA Contact with other sharp object(s), not elsewhere classified, initial encounter: Secondary | ICD-10-CM | POA: Insufficient documentation

## 2013-06-16 LAB — GLUCOSE, CAPILLARY: Glucose-Capillary: 156 mg/dL — ABNORMAL HIGH (ref 70–99)

## 2013-06-16 IMAGING — CR DG CHEST 2V
2 series · 2 of 2 positions shown · non-contrast
Comparison: [DATE]

CLINICAL DATA: Cough and chest congestion.

EXAM:
CHEST  2 VIEW

[view not recorded (1 of 2)]
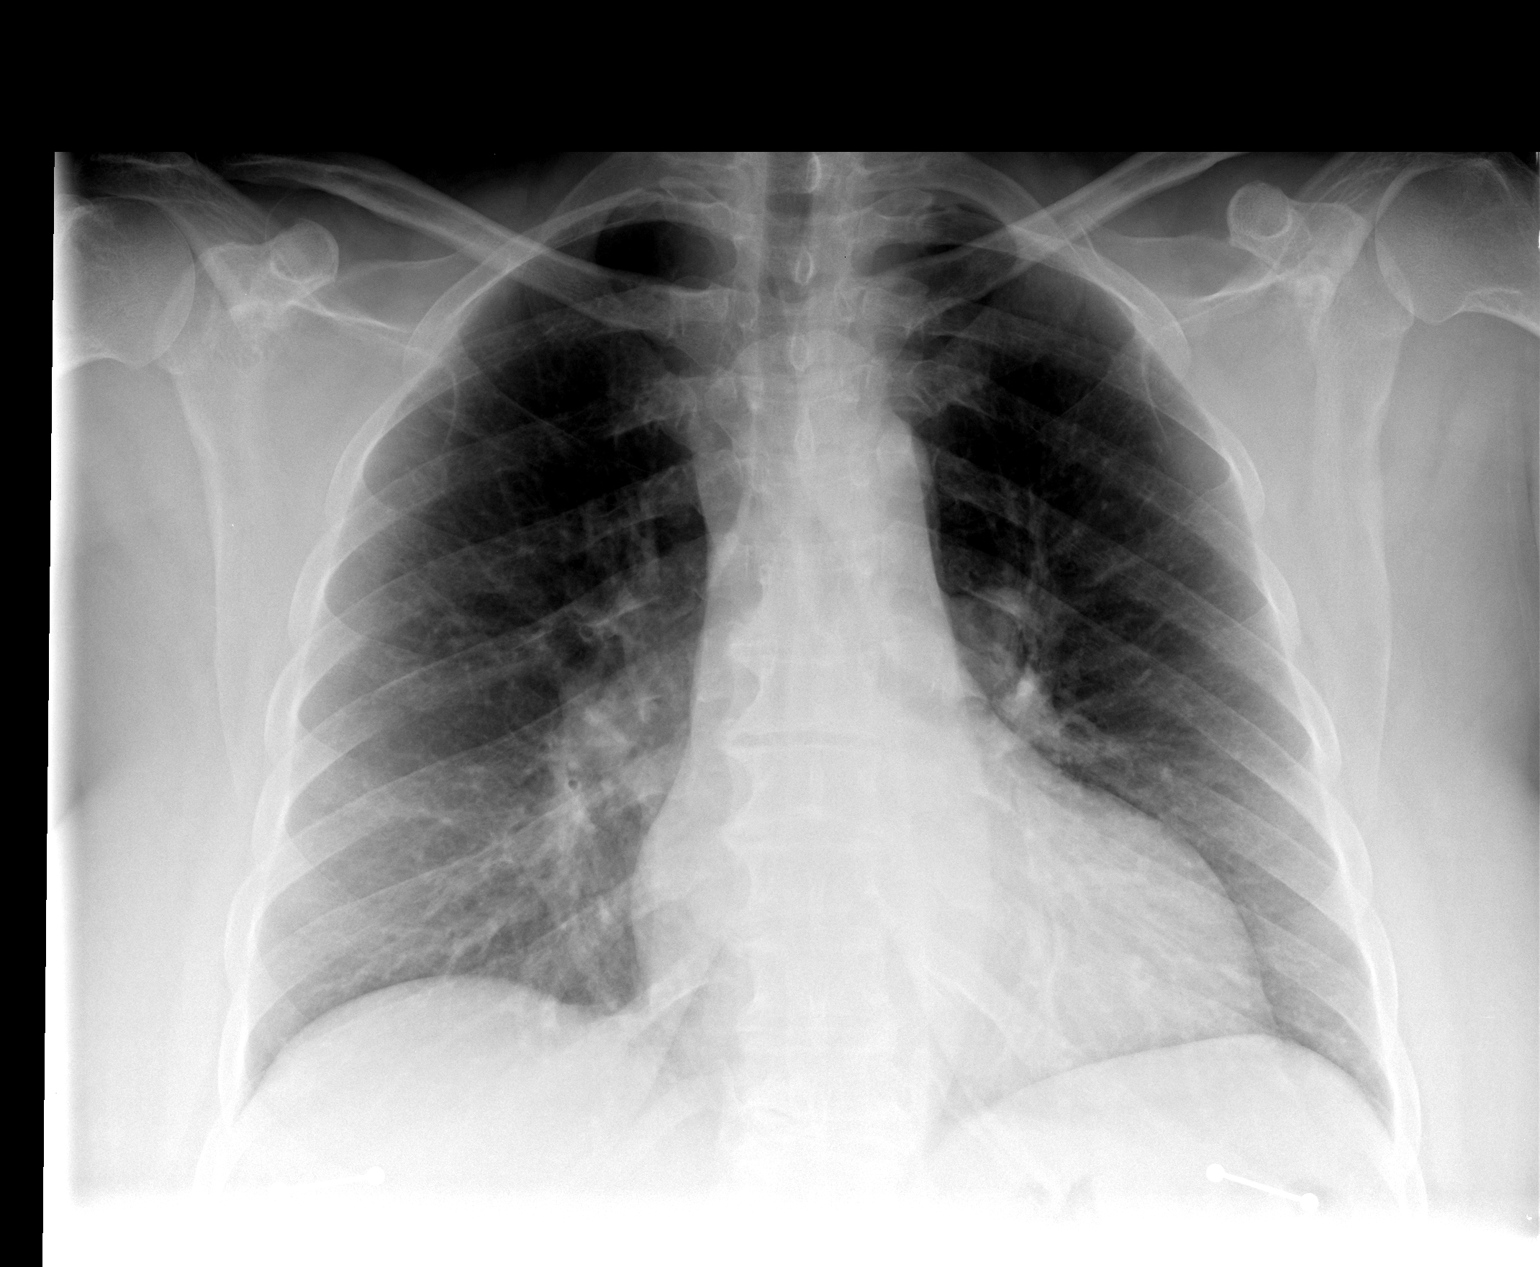

[view not recorded (2 of 2)]
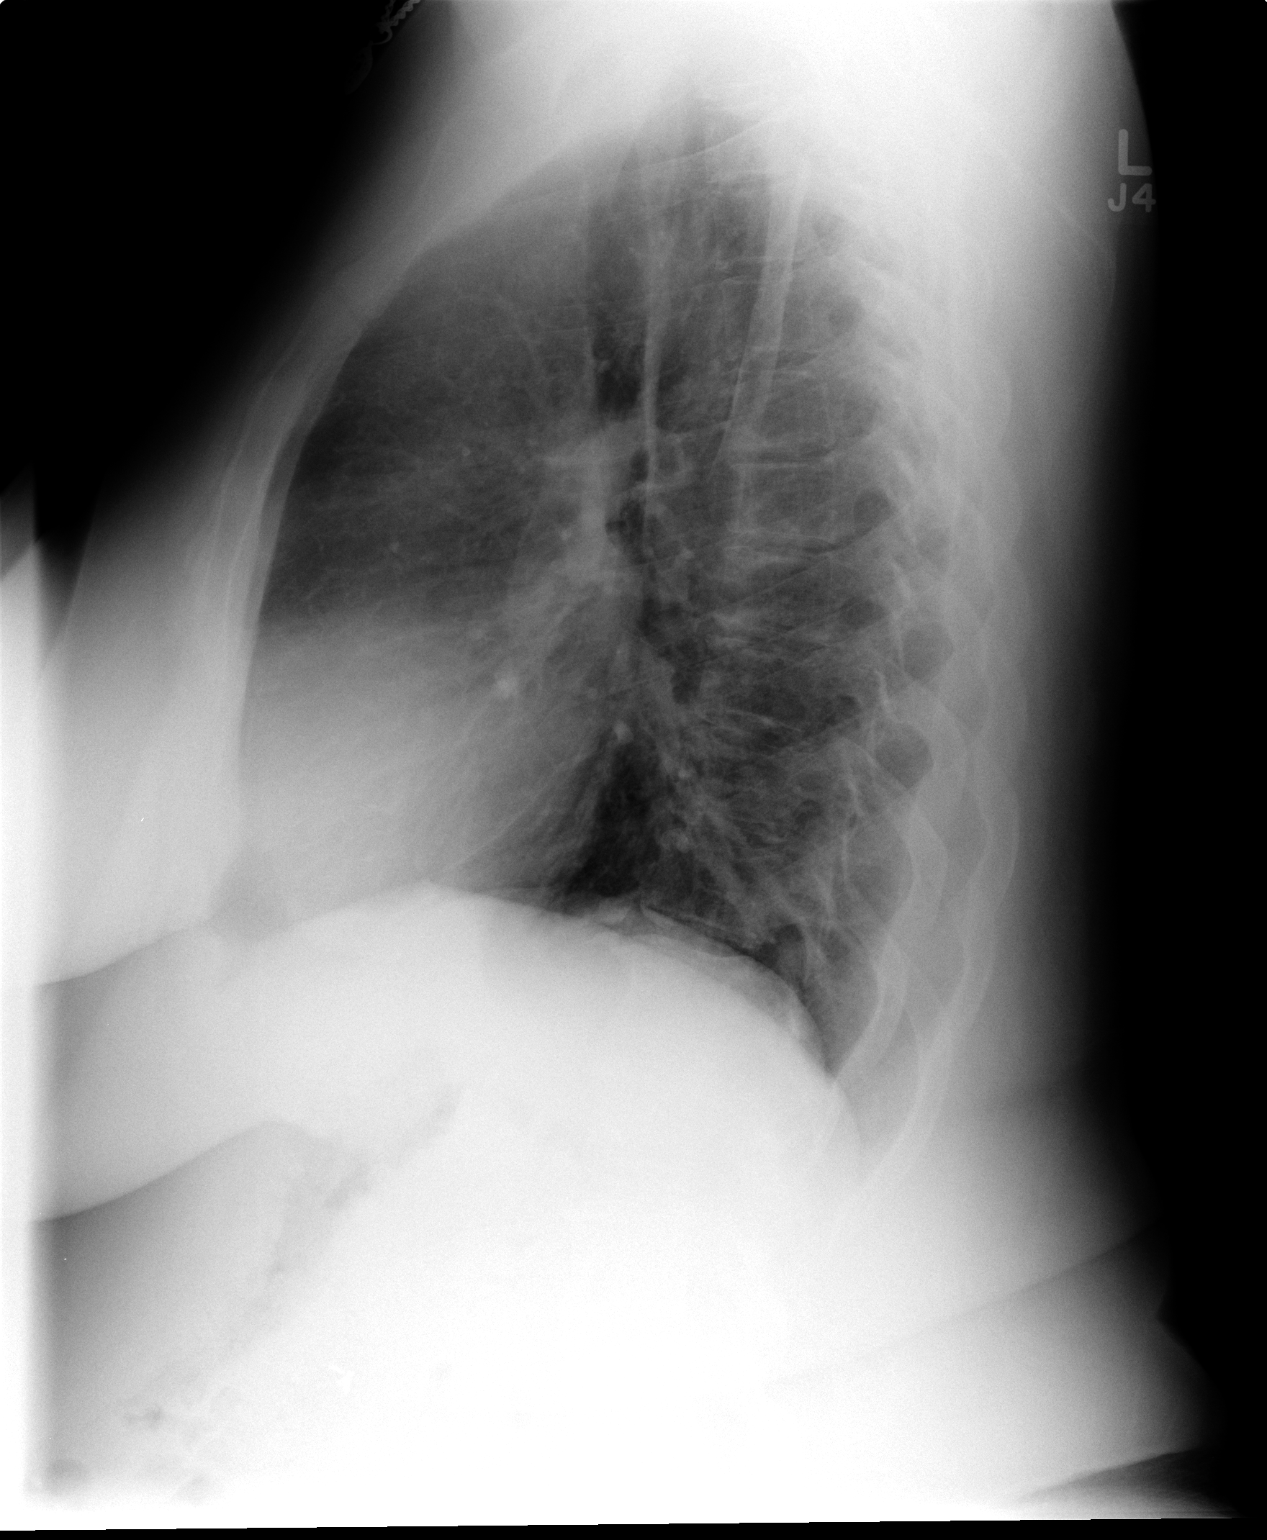

[2 of 2 positions shown; findings below may reference images not displayed]

FINDINGS: There is borderline cardiomegaly. Pulmonary vascularity is normal.
Prominent new peribronchial thickening on the right consistent with
bronchitis. No consolidative infiltrates or effusions. No osseous
abnormality.
IMPRESSION: Bronchitic changes.

## 2013-06-16 MED ORDER — POVIDONE-IODINE 10 % EX SOLN
CUTANEOUS | Status: AC
  Filled 2013-06-16: qty 118

## 2013-06-16 MED ORDER — HYDROCOD POLST-CHLORPHEN POLST 10-8 MG/5ML PO LQCR: 5.0000 mL | Freq: Two times a day (BID) | ORAL | Status: DC | PRN

## 2013-06-16 MED ORDER — TETANUS-DIPHTH-ACELL PERTUSSIS 5-2.5-18.5 LF-MCG/0.5 IM SUSP
0.5000 mL | Freq: Once | INTRAMUSCULAR | Status: AC
  Administered 2013-06-16: 0.5 mL via INTRAMUSCULAR
  Filled 2013-06-16 (×2): qty 0.5

## 2013-06-16 MED ORDER — AZITHROMYCIN 250 MG PO TABS: ORAL_TABLET | ORAL | Status: DC

## 2013-06-16 MED ORDER — SULFAMETHOXAZOLE-TRIMETHOPRIM 800-160 MG PO TABS: 1.0000 | ORAL_TABLET | Freq: Two times a day (BID) | ORAL | Status: DC

## 2013-06-16 MED ORDER — HYDROCOD POLST-CHLORPHEN POLST 10-8 MG/5ML PO LQCR
5.0000 mL | Freq: Once | ORAL | Status: AC
  Administered 2013-06-16: 5 mL via ORAL
  Filled 2013-06-16: qty 5

## 2013-06-16 MED ORDER — DOXYCYCLINE HYCLATE 100 MG PO CAPS: 100.0000 mg | ORAL_CAPSULE | Freq: Two times a day (BID) | ORAL | Status: DC

## 2013-06-16 NOTE — ED Notes (Signed)
Pt c/o strong cough and right ear pain since yesterday. Pain to chest that is sharp and worse with coughing, palpation and deep breathing. Nad. Nondiphoretic. Denies n/v/d. Also states stepped on nail to left foot yesterday and wants TET shot. Nad. Clear sputum

## 2013-06-20 NOTE — ED Provider Notes (Signed)
CSN: [id number]     Arrival date & time 06/16/13  1478 History   First MD Initiated Contact with Patient 06/16/13 1000     Chief Complaint  Patient presents with  . Cough  . Otalgia   (Consider location/radiation/quality/duration/timing/severity/associated sxs/prior Treatment) HPI Comments: Robin [name] is a 47 y.o. Female presenting with a cough productive of clear sputum, sore throat, right ear pain and midsternal burning chest pain triggered by coughing and palpation.  She denies wheezing, shortness of breath, nausea, vomiting and has had no nasal or sinus congestion or drainage.  She is a smoker and reports frequent episodes of bronchitis. She denies fevers or chills, nausea, vomiting, abdominal pain, peripheral edema and orthopnea. Pertinent past medical history is also significant for diabetes, copd and chf.  She is using albuterol prn,  Last use was early this morning but denies wheezing today.  She has not taken any other medicines today.  Additionally, she stepped on a nail yesterday and needs a tetanus update.  She denies continued pain, swellling or other complaint at the site of the injury.  Patient is a 47 y.o. female presenting with cough and ear pain. The history is provided by the patient.  Cough Associated symptoms: chest pain, ear pain and sore throat   Associated symptoms: no chills, no eye discharge, no fever, no rhinorrhea, no shortness of breath and no wheezing   Otalgia Associated symptoms: cough and sore throat   Associated symptoms: no abdominal pain, no congestion, no fever, no rhinorrhea and no tinnitus     Past Medical History  Diagnosis Date  . Type 2 diabetes mellitus   . Essential hypertension, benign   . GERD (gastroesophageal reflux disease)   . COPD (chronic obstructive pulmonary disease)   . Arthritis   . Normal coronary arteries     3/10 - following abnormal Myoview  . Diabetes mellitus   . CHF (congestive heart failure)    Past Surgical  History  Procedure Laterality Date  . Tubal ligation    . Cholecystectomy    . Cesarean section    . Abdominal hysterectomy  09/10/2011    Procedure: HYSTERECTOMY ABDOMINAL;  Surgeon: Tilda Burrow, MD;  Location: [facility];  Service: Gynecology;  Laterality: N/A;  Abdominal hysterectomy  . Scar revision  09/10/2011    Procedure: SCAR REVISION;  Surgeon: Tilda Burrow, MD;  Location: [facility];  Service: Gynecology;  Laterality: N/A;  Wide Excision of old Cicatrix   Family History  Problem Relation Age of Onset  . Cirrhosis Mother   . Diabetes type II Father   . Diabetes type II Sister   . Anesthesia problems Neg Hx   . Hypotension Neg Hx   . Malignant hyperthermia Neg Hx   . Pseudochol deficiency Neg Hx    History  Substance Use Topics  . Smoking status: Current Every Day Smoker -- 0.25 packs/day for 29 years    Types: Cigarettes  . Smokeless tobacco: Never Used     Comment: 1 cigarette a day  . Alcohol Use: 0.0 oz/week     Comment: occ   OB History   Grav Para Term Preterm Abortions TAB SAB Ect Mult Living                 Review of Systems  Constitutional: Negative for fever and chills.  HENT: Positive for ear pain and sore throat. Negative for congestion, rhinorrhea, sinus pressure, tinnitus, trouble swallowing and voice change.   Eyes: Negative  for discharge.  Respiratory: Positive for cough. Negative for chest tightness, shortness of breath, wheezing and stridor.   Cardiovascular: Positive for chest pain.  Gastrointestinal: Negative for abdominal pain.  Genitourinary: Negative.   Musculoskeletal: Negative for arthralgias and joint swelling.  Neurological: Negative for dizziness and light-headedness.    Allergies  Bee venom and Naproxen  Home Medications   Current Outpatient Rx  Name  Route  Sig  Dispense  Refill  . enalapril (VASOTEC) 20 MG tablet   Oral   Take 20 mg by mouth daily.         Marland Kitchen glimepiride (AMARYL) 4 MG tablet   Oral   Take 4 mg by mouth  daily before breakfast.           . hydrochlorothiazide (HYDRODIURIL) 25 MG tablet   Oral   Take 25 mg by mouth daily.           Marland Kitchen ibuprofen (ADVIL,MOTRIN) 200 MG tablet   Oral   Take 800 mg by mouth every 6 (six) hours as needed for pain.         . metFORMIN (GLUCOPHAGE) 1000 MG tablet   Oral   Take 1,000 mg by mouth 2 (two) times daily with a meal.           . omeprazole (PRILOSEC) 20 MG capsule   Oral   Take 20 mg by mouth daily.         Marland Kitchen albuterol (PROVENTIL HFA;VENTOLIN HFA) 108 (90 BASE) MCG/ACT inhaler   Inhalation   Inhale 1-2 puffs into the lungs every 6 (six) hours as needed for wheezing.   1 Inhaler   0   . azithromycin (ZITHROMAX) 250 MG tablet      Take 2 tablets by mouth on day one followed by one tablet daily for 4 days.   6 tablet   0   . chlorpheniramine-HYDROcodone (TUSSIONEX PENNKINETIC ER) 10-8 MG/5ML LQCR   Oral   Take 5 mLs by mouth every 12 (twelve) hours as needed for cough.   75 mL   0   . sulfamethoxazole-trimethoprim (SEPTRA DS) 800-160 MG per tablet   Oral   Take 1 tablet by mouth every 12 (twelve) hours.   20 tablet   0    BP 226/99  Pulse 65  Temp(Src) 98.9 F (37.2 C) (Oral)  Resp 22  SpO2 99%  LMP 08/21/2011 Physical Exam  Nursing note and vitals reviewed. Constitutional: She appears well-developed and well-nourished.  Vital signs reviewed.  Pt took her enalapril and hctz a few minutes before arrival.  HENT:  Head: Normocephalic and atraumatic.  Eyes: Conjunctivae are normal.  Neck: Normal range of motion.  Cardiovascular: Normal rate, regular rhythm, normal heart sounds and intact distal pulses.   Pulmonary/Chest: Effort normal and breath sounds normal. She has no decreased breath sounds. She has no wheezes. She has no rhonchi. She has no rales. She exhibits tenderness.  Frequent cough with clear sputum production.  No wheezing, normal aeration.  Abdominal: Soft. Bowel sounds are normal. There is no tenderness.   Musculoskeletal: Normal range of motion.  Neurological: She is alert.  Skin: Skin is warm and dry.  Small puncture left foot dorsum.  Sealed, without drainage.  No erythema or edema. Slightly tender.  Psychiatric: She has a normal mood and affect.    ED Course  Procedures (including critical care time) Labs Review Labs Reviewed  GLUCOSE, CAPILLARY - Abnormal; Notable for the following:    Glucose-Capillary 156 (*)  All other components within normal limits   Imaging Review No results found.  EKG Interpretation   None       MDM   1. Bronchitis, acute    Patients labs and/or radiological studies were viewed and considered during the medical decision making and disposition process. Pt was prescribed septra for puncture wound,  Tetanus updated.  Acute bronchitis on xray - given copd history,  Prescribed zithromax, also prescribed tussionex which was given here and help improve her cough.  She was encouraged smoking cessation.  Encouraged continued use of albuterol q 4 hours prn.  Also encouraged close recheck of bp with elevation today. I suspect her enalapril has not become effective as she took just a few minutes before arrival.  However,  Should get rechecked once feeling better,  She may need adjustment in her meds if she spikes between doses.  No exam or xray findings to suggest chf.  VSS (except bp),  Doubt PE.    Burgess Amor, PA-C 06/20/13 1351

## 2013-06-22 NOTE — ED Provider Notes (Signed)
Medical screening examination/treatment/procedure(s) were performed by non-physician practitioner and as supervising physician I was immediately available for consultation/collaboration.  EKG Interpretation   None         Romana Deaton L Briceida Rasberry, MD 06/22/13 1109 

## 2013-07-09 ENCOUNTER — Emergency Department (HOSPITAL_COMMUNITY)
Admission: EM | Admit: 2013-07-09 | Discharge: 2013-07-09 | Disposition: A | Discharge status: Home / Self Care | Payer: Self-pay | Attending: Emergency Medicine | Admitting: Emergency Medicine

## 2013-07-09 ENCOUNTER — Encounter (HOSPITAL_COMMUNITY): Payer: Self-pay | Admitting: Emergency Medicine

## 2013-07-09 DIAGNOSIS — I10 Essential (primary) hypertension: Secondary | ICD-10-CM | POA: Insufficient documentation

## 2013-07-09 DIAGNOSIS — J4489 Other specified chronic obstructive pulmonary disease: Secondary | ICD-10-CM | POA: Insufficient documentation

## 2013-07-09 DIAGNOSIS — J449 Chronic obstructive pulmonary disease, unspecified: Secondary | ICD-10-CM | POA: Insufficient documentation

## 2013-07-09 DIAGNOSIS — B029 Zoster without complications: Secondary | ICD-10-CM | POA: Insufficient documentation

## 2013-07-09 DIAGNOSIS — I509 Heart failure, unspecified: Secondary | ICD-10-CM | POA: Insufficient documentation

## 2013-07-09 DIAGNOSIS — K219 Gastro-esophageal reflux disease without esophagitis: Secondary | ICD-10-CM | POA: Insufficient documentation

## 2013-07-09 DIAGNOSIS — M129 Arthropathy, unspecified: Secondary | ICD-10-CM | POA: Insufficient documentation

## 2013-07-09 DIAGNOSIS — Z79899 Other long term (current) drug therapy: Secondary | ICD-10-CM | POA: Insufficient documentation

## 2013-07-09 DIAGNOSIS — E119 Type 2 diabetes mellitus without complications: Secondary | ICD-10-CM | POA: Insufficient documentation

## 2013-07-09 DIAGNOSIS — F17200 Nicotine dependence, unspecified, uncomplicated: Secondary | ICD-10-CM | POA: Insufficient documentation

## 2013-07-09 MED ORDER — ACYCLOVIR 800 MG PO TABS
800.0000 mg | ORAL_TABLET | Freq: Once | ORAL | Status: AC
  Administered 2013-07-09: 800 mg via ORAL
  Filled 2013-07-09: qty 1

## 2013-07-09 MED ORDER — ACYCLOVIR 800 MG PO TABS: 800.0000 mg | ORAL_TABLET | Freq: Every day | ORAL | Status: DC

## 2013-07-09 MED ORDER — HYDROCODONE-ACETAMINOPHEN 5-325 MG PO TABS: ORAL_TABLET | ORAL | Status: DC

## 2013-07-09 NOTE — ED Notes (Signed)
Pt c/o rash to left side of abd and back.  C/O itching and burning at site.  Reports has had rash since yesterday.

## 2013-07-09 NOTE — ED Notes (Signed)
Pt has rash to lt side of trunk, burns , itches and hurts.

## 2013-07-10 NOTE — ED Provider Notes (Signed)
CSN: [id number]     Arrival date & time 07/09/13  1100 History   First MD Initiated Contact with Patient 07/09/13 1132     Chief Complaint  Patient presents with  . Rash   (Consider location/radiation/quality/duration/timing/severity/associated sxs/prior Treatment) Patient is a 47 y.o. female presenting with rash. The history is provided by the patient.  Rash Location:  Torso Torso rash location:  L flank Quality: blistering, itchiness, painful and redness   Quality: not bruising, not draining, not scaling and not swelling   Pain details:    Quality:  Stinging, itching, sharp and tingling   Severity:  Moderate   Onset quality:  Gradual   Duration:  2 days   Timing:  Constant   Progression:  Worsening Severity:  Moderate Onset quality:  Gradual Timing:  Constant Progression:  Spreading Chronicity:  New Context: not exposure to similar rash, not medications, not new detergent/soap and not sick contacts   Relieved by:  Nothing Worsened by:  Contact Ineffective treatments:  None tried Associated symptoms: no abdominal pain, no fatigue, no fever, no headaches, no induration, no joint pain, no myalgias, no nausea, no shortness of breath, no sore throat, no throat swelling, no URI, not vomiting and not wheezing     Past Medical History  Diagnosis Date  . Type 2 diabetes mellitus   . Essential hypertension, benign   . GERD (gastroesophageal reflux disease)   . COPD (chronic obstructive pulmonary disease)   . Arthritis   . Normal coronary arteries     3/10 - following abnormal Myoview  . Diabetes mellitus   . CHF (congestive heart failure)    Past Surgical History  Procedure Laterality Date  . Tubal ligation    . Cholecystectomy    . Cesarean section    . Abdominal hysterectomy  09/10/2011    Procedure: HYSTERECTOMY ABDOMINAL;  Surgeon: Tilda Burrow, MD;  Location: [facility];  Service: Gynecology;  Laterality: N/A;  Abdominal hysterectomy  . Scar revision  09/10/2011   Procedure: SCAR REVISION;  Surgeon: Tilda Burrow, MD;  Location: [facility];  Service: Gynecology;  Laterality: N/A;  Wide Excision of old Cicatrix   Family History  Problem Relation Age of Onset  . Cirrhosis Mother   . Diabetes type II Father   . Diabetes type II Sister   . Anesthesia problems Neg Hx   . Hypotension Neg Hx   . Malignant hyperthermia Neg Hx   . Pseudochol deficiency Neg Hx    History  Substance Use Topics  . Smoking status: Current Every Day Smoker -- 0.25 packs/day for 29 years    Types: Cigarettes  . Smokeless tobacco: Never Used     Comment: 1 cigarette a day  . Alcohol Use: 0.0 oz/week     Comment: occ   OB History   Grav Para Term Preterm Abortions TAB SAB Ect Mult Living                 Review of Systems  Constitutional: Negative for fever, chills, activity change, appetite change and fatigue.  HENT: Negative for facial swelling, sore throat and trouble swallowing.   Respiratory: Negative for chest tightness, shortness of breath and wheezing.   Cardiovascular: Negative for chest pain.  Gastrointestinal: Negative for nausea, vomiting and abdominal pain.  Genitourinary: Negative for dysuria, frequency, decreased urine volume, vaginal bleeding, vaginal discharge and pelvic pain.  Musculoskeletal: Negative for arthralgias, myalgias, neck pain and neck stiffness.  Skin: Positive for rash. Negative for wound.  Neurological: Negative for dizziness, weakness, numbness and headaches.  All other systems reviewed and are negative.    Allergies  Bee venom  Home Medications   Current Outpatient Rx  Name  Route  Sig  Dispense  Refill  . albuterol (PROVENTIL HFA;VENTOLIN HFA) 108 (90 BASE) MCG/ACT inhaler   Inhalation   Inhale 1-2 puffs into the lungs every 6 (six) hours as needed for wheezing.   1 Inhaler   0   . enalapril (VASOTEC) 20 MG tablet   Oral   Take 20 mg by mouth daily.         Marland Kitchen glimepiride (AMARYL) 4 MG tablet   Oral   Take 4 mg by  mouth daily before breakfast.           . hydrochlorothiazide (HYDRODIURIL) 25 MG tablet   Oral   Take 25 mg by mouth daily.           Marland Kitchen ibuprofen (ADVIL,MOTRIN) 200 MG tablet   Oral   Take 400 mg by mouth every 6 (six) hours as needed for moderate pain.          . metFORMIN (GLUCOPHAGE) 1000 MG tablet   Oral   Take 1,000 mg by mouth 2 (two) times daily with a meal.           . omeprazole (PRILOSEC) 20 MG capsule   Oral   Take 20 mg by mouth daily.         Marland Kitchen acyclovir (ZOVIRAX) 800 MG tablet   Oral   Take 1 tablet (800 mg total) by mouth 5 (five) times daily. For 10 days   50 tablet   0   . HYDROcodone-acetaminophen (NORCO/VICODIN) 5-325 MG per tablet      Take one-two tabs po q 4-6 hrs prn pain   20 tablet   0    BP 189/77  Pulse 83  Temp(Src) 98.2 F (36.8 C)  Resp 20  Ht 5\' 7"  (1.702 m)  Wt 210 lb (95.255 kg)  BMI 32.88 kg/m2  SpO2 100%  LMP 08/21/2011 Physical Exam  Nursing note and vitals reviewed. Constitutional: She is oriented to [place], place, [name] No distress.  HENT:  Head: Normocephalic and atraumatic.  Mouth/Throat: Oropharynx is clear and moist.  Neck: Normal range of motion. Neck supple.  Cardiovascular: Normal rate, regular rhythm, normal heart sounds and intact distal pulses.   No murmur heard. Pulmonary/Chest: Effort normal and breath sounds normal. No respiratory distress.  Musculoskeletal: She exhibits no edema and no tenderness.  Lymphadenopathy:    She has no cervical adenopathy.  Neurological: She is alert and oriented to [place], place, and time. She exhibits normal muscle tone. Coordination normal.  Skin: Skin is warm. Rash noted. There is erythema.  Grouped erythematous vesicles to the left flank and lower abdomen.  No weeping, pustules or edema.      ED Course  Procedures (including critical care time) Labs Review Labs Reviewed - No data to display Imaging Review No  results found.  EKG Interpretation   None       MDM   1. Herpes zoster    Patient with onset of pain to left flank for 2 days, rash since yesterday.  Sx's appear c/w shingles.  Plan includes acyclovir, vicodin #20 for pain and close f/u with her PMD at health dept for recheck.  Pt verbalized understanding and agrees to plan.  VSS, she appears stable for d/c  Camika Marsico L. Trisha Mangle, PA-C 07/10/13 2139

## 2013-07-12 NOTE — ED Provider Notes (Signed)
Medical screening examination/treatment/procedure(s) were performed by non-physician practitioner and as supervising physician I was immediately available for consultation/collaboration.  EKG Interpretation   None         Chyanna Flock, MD 07/12/13 0932 

## 2013-08-18 ENCOUNTER — Encounter (HOSPITAL_COMMUNITY): Payer: Self-pay | Admitting: Emergency Medicine

## 2013-08-18 ENCOUNTER — Emergency Department (HOSPITAL_COMMUNITY): Payer: Self-pay

## 2013-08-18 ENCOUNTER — Emergency Department (HOSPITAL_COMMUNITY)
Admission: EM | Admit: 2013-08-18 | Discharge: 2013-08-18 | Disposition: A | Discharge status: Home / Self Care | Payer: Self-pay | Attending: Emergency Medicine | Admitting: Emergency Medicine

## 2013-08-18 DIAGNOSIS — I10 Essential (primary) hypertension: Secondary | ICD-10-CM | POA: Insufficient documentation

## 2013-08-18 DIAGNOSIS — K219 Gastro-esophageal reflux disease without esophagitis: Secondary | ICD-10-CM | POA: Insufficient documentation

## 2013-08-18 DIAGNOSIS — F17200 Nicotine dependence, unspecified, uncomplicated: Secondary | ICD-10-CM | POA: Insufficient documentation

## 2013-08-18 DIAGNOSIS — R0789 Other chest pain: Secondary | ICD-10-CM

## 2013-08-18 DIAGNOSIS — M79609 Pain in unspecified limb: Secondary | ICD-10-CM | POA: Insufficient documentation

## 2013-08-18 DIAGNOSIS — M129 Arthropathy, unspecified: Secondary | ICD-10-CM | POA: Insufficient documentation

## 2013-08-18 DIAGNOSIS — I509 Heart failure, unspecified: Secondary | ICD-10-CM | POA: Insufficient documentation

## 2013-08-18 DIAGNOSIS — Z79899 Other long term (current) drug therapy: Secondary | ICD-10-CM | POA: Insufficient documentation

## 2013-08-18 DIAGNOSIS — R071 Chest pain on breathing: Secondary | ICD-10-CM | POA: Insufficient documentation

## 2013-08-18 DIAGNOSIS — J441 Chronic obstructive pulmonary disease with (acute) exacerbation: Secondary | ICD-10-CM | POA: Insufficient documentation

## 2013-08-18 DIAGNOSIS — E119 Type 2 diabetes mellitus without complications: Secondary | ICD-10-CM | POA: Insufficient documentation

## 2013-08-18 LAB — URINE MICROSCOPIC-ADD ON

## 2013-08-18 LAB — GLUCOSE, CAPILLARY: Glucose-Capillary: 259 mg/dL — ABNORMAL HIGH (ref 70–99)

## 2013-08-18 LAB — COMPREHENSIVE METABOLIC PANEL
ALT: 13 U/L (ref 0–35)
AST: 14 U/L (ref 0–37)
Albumin: 3.6 g/dL (ref 3.5–5.2)
Alkaline Phosphatase: 105 U/L (ref 39–117)
BUN: 12 mg/dL (ref 6–23)
CO2: 23 mEq/L (ref 19–32)
Calcium: 9.5 mg/dL (ref 8.4–10.5)
Chloride: 100 mEq/L (ref 96–112)
Creatinine, Ser: 0.64 mg/dL (ref 0.50–1.10)
GFR calc Af Amer: >90 mL/min (ref >90)
GFR calc non Af Amer: >90 mL/min (ref >90)
Glucose, Bld: 341 mg/dL — ABNORMAL HIGH (ref 70–99)
Potassium: 4.1 mEq/L (ref 3.7–5.3)
Sodium: 138 mEq/L (ref 137–147)
Total Bilirubin: 0.3 mg/dL (ref 0.3–1.2)
Total Protein: 7.2 g/dL (ref 6.0–8.3)

## 2013-08-18 LAB — CBC WITH DIFFERENTIAL/PLATELET
Basophils Absolute: 0 10*3/uL (ref 0.0–0.1)
Basophils Relative: 0 % (ref 0–1)
Eosinophils Absolute: 0.1 10*3/uL (ref 0.0–0.7)
Eosinophils Relative: 2 % (ref 0–5)
HCT: 42.8 % (ref 36.0–46.0)
Hemoglobin: 15.1 g/dL — ABNORMAL HIGH (ref 12.0–15.0)
Lymphocytes Relative: 46 % (ref 12–46)
Lymphs Abs: 3.2 10*3/uL (ref 0.7–4.0)
MCH: 31.1 pg (ref 26.0–34.0)
MCHC: 35.3 g/dL (ref 30.0–36.0)
MCV: 88.2 fL (ref 78.0–100.0)
Monocytes Absolute: 0.6 10*3/uL (ref 0.1–1.0)
Monocytes Relative: 8 % (ref 3–12)
Neutro Abs: 3 10*3/uL (ref 1.7–7.7)
Neutrophils Relative %: 43 % (ref 43–77)
Platelets: 230 10*3/uL (ref 150–400)
RBC: 4.85 MIL/uL (ref 3.87–5.11)
RDW: 12.8 % (ref 11.5–15.5)
WBC: 7 10*3/uL (ref 4.0–10.5)

## 2013-08-18 LAB — URINALYSIS, ROUTINE W REFLEX MICROSCOPIC
Bilirubin Urine: NEGATIVE
Glucose, UA: >1000 mg/dL — AB
Hgb urine dipstick: NEGATIVE
Ketones, ur: NEGATIVE mg/dL
Leukocytes, UA: NEGATIVE
Nitrite: NEGATIVE
Protein, ur: NEGATIVE mg/dL
Specific Gravity, Urine: >1.03 — ABNORMAL HIGH (ref 1.005–1.030)
Urobilinogen, UA: 0.2 mg/dL (ref 0.0–1.0)
pH: 5.5 (ref 5.0–8.0)

## 2013-08-18 LAB — TROPONIN I
Troponin I: <0.3 ng/mL (ref <0.30)
Troponin I: <0.3 ng/mL (ref <0.30)

## 2013-08-18 LAB — D-DIMER, QUANTITATIVE: D-Dimer, Quant: 0.64 ug/mL-FEU — ABNORMAL HIGH (ref 0.00–0.48)

## 2013-08-18 LAB — PRO B NATRIURETIC PEPTIDE: Pro B Natriuretic peptide (BNP): 298.1 pg/mL — ABNORMAL HIGH (ref 0–125)

## 2013-08-18 IMAGING — CR DG CHEST 2V
2 series · 2 of 2 positions shown · non-contrast
Comparison: [DATE]

CLINICAL DATA: Chest pressure and chest pain on the left side.
Shortness of breath. COPD. Congestive heart failure.

EXAM:
CHEST  2 VIEW

[view not recorded (1 of 2)]
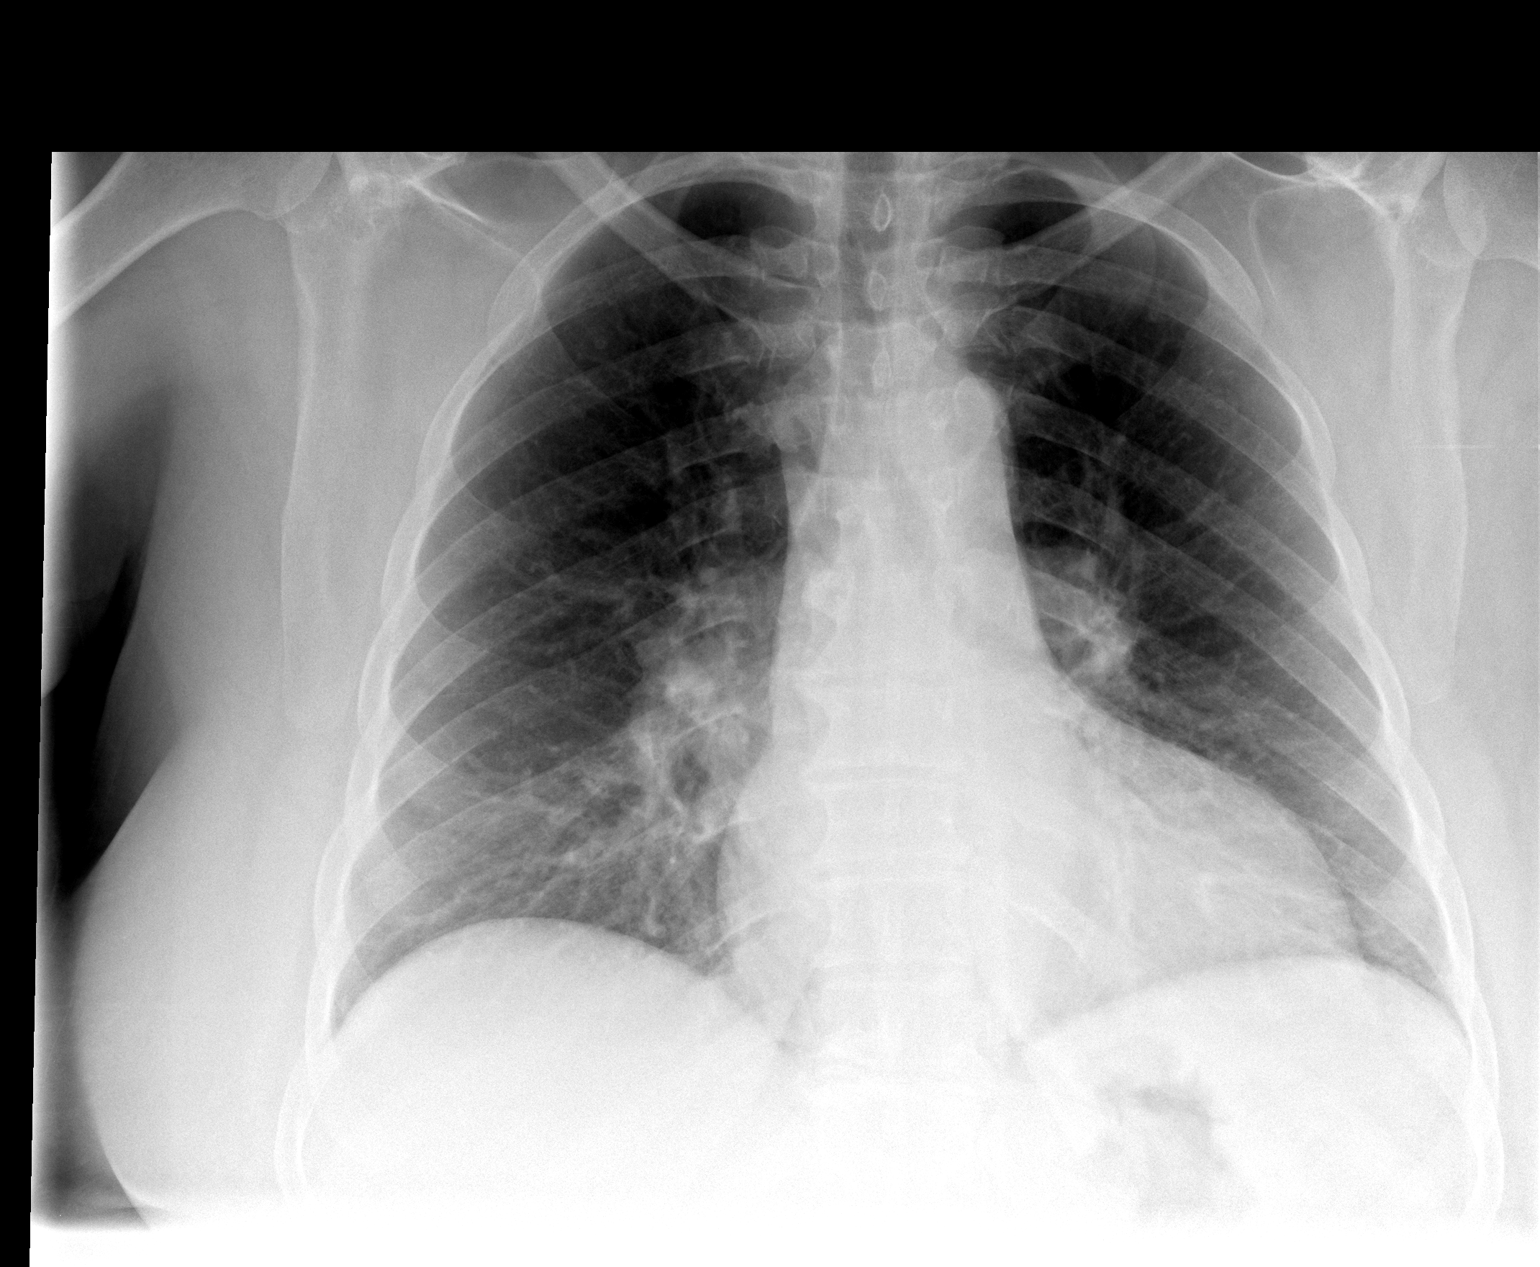

[view not recorded (2 of 2)]
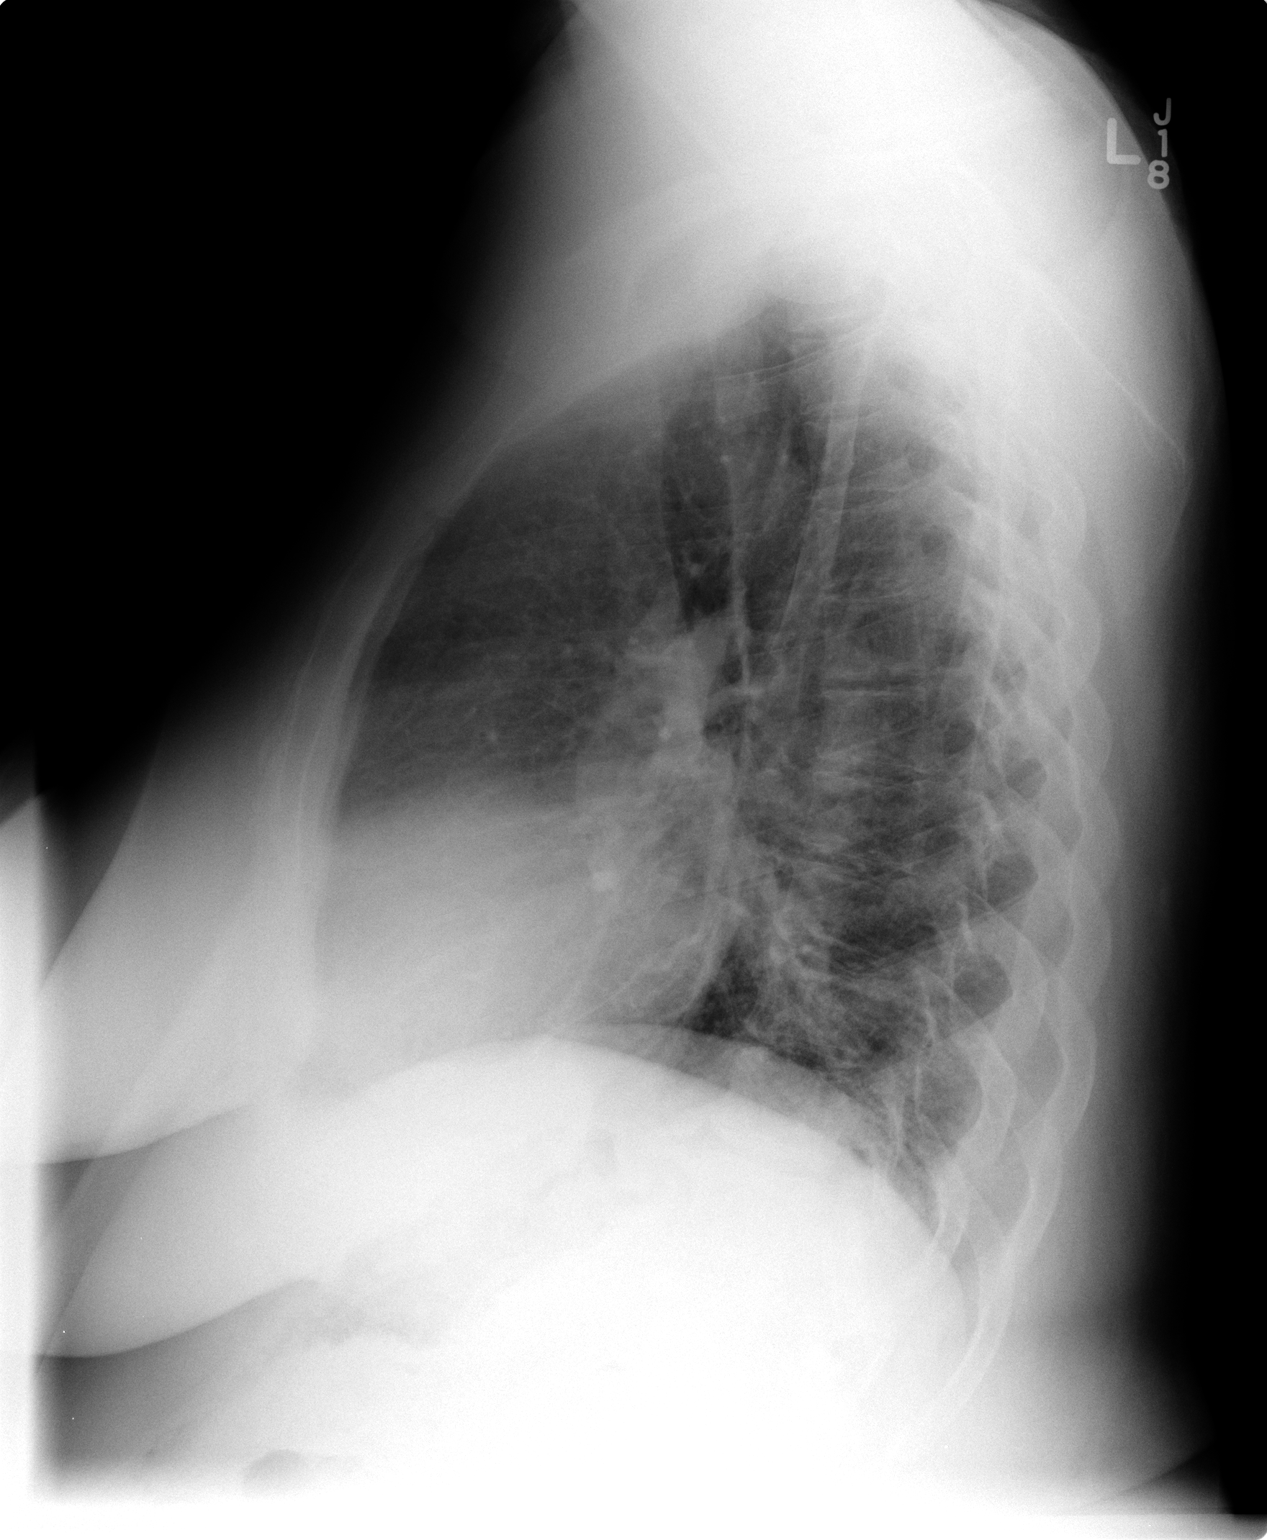

[2 of 2 positions shown; findings below may reference images not displayed]

FINDINGS: Stable mild to moderate cardiomegaly with prominence of central
pulmonary vasculature. No edema. Mild central airway thickening. No
significant airspace opacity or pleural effusion.
IMPRESSION: 1. Airway thickening is present, suggesting bronchitis or reactive
airways disease.
2. Stable mild to moderate cardiomegaly. Prominent central pulmonary
vasculature suggests pulmonary arterial hypertension.
3. Thoracic spondylosis.

## 2013-08-18 IMAGING — CT CT ANGIO CHEST
2 of 7 series · 4 of 46 positions shown · IV contrast (Omnipaque 300)
Comparison: Chest radiograph [DATE]

CLINICAL DATA: Chest pain

EXAM:
CT ANGIOGRAPHY CHEST WITH CONTRAST
TECHNIQUE: Multidetector CT images were obtained through the chest prior to
intravenous contrast maternal administration. Multidetector CT
imaging of the chest was then performed using the standard protocol
during bolus administration of intravenous contrast. Multiplanar CT
image reconstructions including MIPs were obtained to evaluate the
vascular anatomy.
CONTRAST:  100mL OMNIPAQUE IOHEXOL 350 MG/ML SOLN

[Series 5: dissection 3.0 b40f · axial · 0.68mm/px · z∈[-252,-150]mm · 2 of 104 slices shown]
[im 35/104  lung]
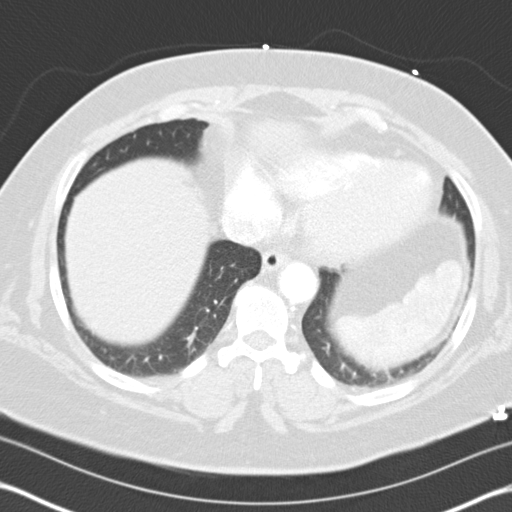
[im 69/104  soft-tissue]
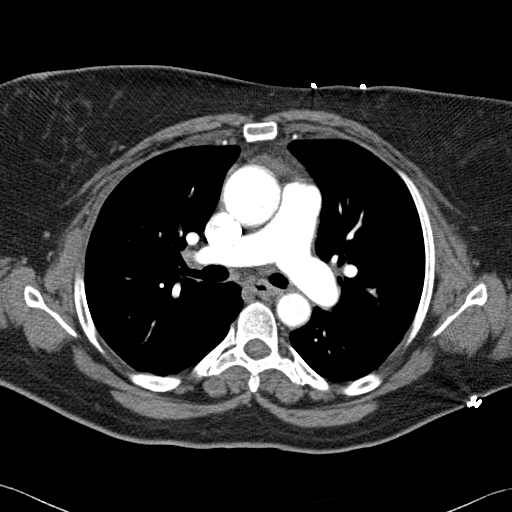

[Series 8: mpr cor post contrast · coronal · 0.62mm/px · 2 of 81 slices shown]
[im 27/81  soft-tissue]
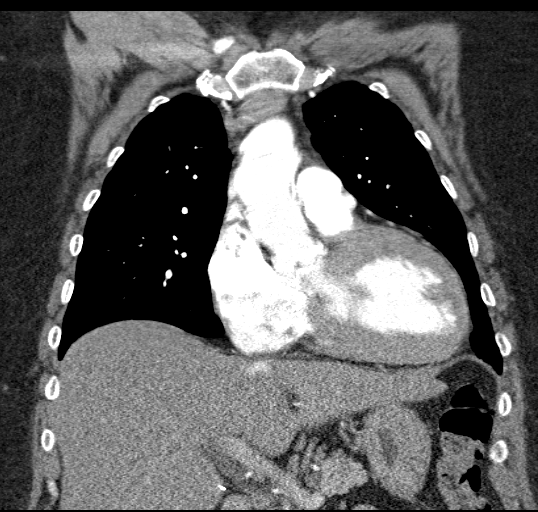
[im 54/81  soft-tissue]
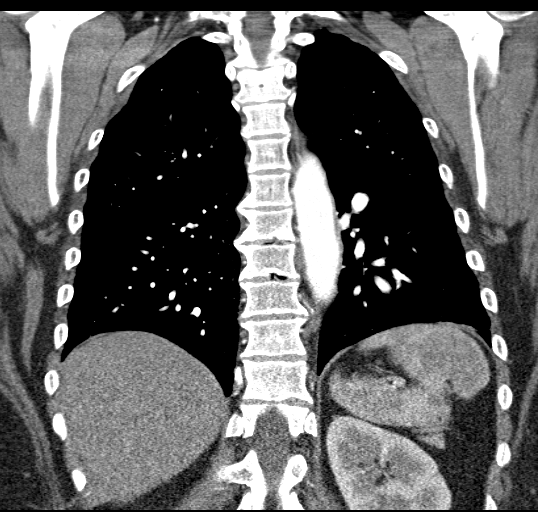

[4 of 46 positions shown; findings below may reference images not displayed]

FINDINGS: There is no appreciable thoracic aortic dissection. The ascending
thoracic aorta is prominent measuring 3.9 by 4.0 cm in size. There
is no evidence suggesting of aortic ulceration. There is no
demonstrable pulmonary embolus.

On axial slice 42, series 6, there is a 2 mm nodular opacity in the
superior segment of the left lower lobe. There is no edema or
consolidation.

There is no appreciable thoracic adenopathy. The pericardium is not
thickened. There is left ventricular hypertrophy.

Visualized upper abdominal structures appear normal. The superior
mesenteric, celiac, and renal arteries are all widely patent. There
are no blastic or lytic bone lesions.

Review of the MIP images confirms the above findings.
IMPRESSION: Mild prominence of the ascending aorta. There is no demonstrable
thoracic or upper abdominal aortic dissection. No demonstrable
pulmonary embolus.

No edema or consolidation is apparent. There is a 2 mm nodular
opacity in superior segment left lower lobe. Followup of this
nodular opacity should be based on [HOSPITAL] guidelines. If
the patient is at high risk for bronchogenic carcinoma, follow-up
chest CT at [[facility]] is recommended. If the patient is at low risk, no
follow-up is needed. This recommendation follows the consensus
statement: Guidelines for Management of Small Pulmonary Nodules
Detected on CT Scans: A Statement from the [HOSPITAL] as

## 2013-08-18 MED ORDER — MORPHINE SULFATE 4 MG/ML IJ SOLN
4.0000 mg | Freq: Once | INTRAMUSCULAR | Status: AC
  Administered 2013-08-18: 4 mg via INTRAVENOUS
  Filled 2013-08-18: qty 1

## 2013-08-18 MED ORDER — GI COCKTAIL ~~LOC~~
ORAL | Status: AC
  Filled 2013-08-18: qty 30

## 2013-08-18 MED ORDER — GI COCKTAIL ~~LOC~~
30.0000 mL | Freq: Once | ORAL | Status: AC
  Administered 2013-08-18: 30 mL via ORAL

## 2013-08-18 MED ORDER — SODIUM CHLORIDE 0.9 % IJ SOLN
INTRAMUSCULAR | Status: AC
  Filled 2013-08-18: qty 250

## 2013-08-18 MED ORDER — KETOROLAC TROMETHAMINE 30 MG/ML IJ SOLN
30.0000 mg | Freq: Once | INTRAMUSCULAR | Status: AC
  Administered 2013-08-18: 30 mg via INTRAVENOUS
  Filled 2013-08-18: qty 1

## 2013-08-18 MED ORDER — IOHEXOL 350 MG/ML SOLN
100.0000 mL | Freq: Once | INTRAVENOUS | Status: AC | PRN
  Administered 2013-08-18: 100 mL via INTRAVENOUS

## 2013-08-18 MED ORDER — HYDROCODONE-ACETAMINOPHEN 5-325 MG PO TABS: 2.0000 | ORAL_TABLET | ORAL | Status: DC | PRN

## 2013-08-18 MED ORDER — HYDROMORPHONE HCL PF 1 MG/ML IJ SOLN
1.0000 mg | Freq: Once | INTRAMUSCULAR | Status: AC
  Administered 2013-08-18: 1 mg via INTRAVENOUS
  Filled 2013-08-18: qty 1

## 2013-08-18 MED ORDER — LABETALOL HCL 5 MG/ML IV SOLN
10.0000 mg | Freq: Once | INTRAVENOUS | Status: AC
  Administered 2013-08-18: 10 mg via INTRAVENOUS
  Filled 2013-08-18: qty 4

## 2013-08-18 MED ORDER — IBUPROFEN 800 MG PO TABS: 800.0000 mg | ORAL_TABLET | Freq: Three times a day (TID) | ORAL | Status: DC

## 2013-08-18 NOTE — ED Provider Notes (Signed)
CSN: [id number]     Arrival date & time 08/18/13  1134 History  This chart was scribed for Robin Essex, MD by Ludger Nutting, ED Scribe. This patient was seen in room [facility] and the patient's care was started 12:18 PM.    Chief Complaint  Patient presents with  . Chest Pain    The history is provided by the patient. No language interpreter was used.    HPI Comments: Robin [name] is a 48 y.o. female with past medical history of COPD, CHF, DM who presents to the Emergency Department complaining of constant, waxing and waning left chest pain with radiation to the left arm that began yesterday. She reports similar symptoms 3 years ago. She reports SOB with exertion. She states applied pressure and movement of the left arm worsens the pain. She states nothing alleviates the pain. She denies prior MI. She denies weakness, numbness, vomiting.   Past Medical History  Diagnosis Date  . Type 2 diabetes mellitus   . Essential hypertension, benign   . GERD (gastroesophageal reflux disease)   . COPD (chronic obstructive pulmonary disease)   . Arthritis   . Normal coronary arteries     3/10 - following abnormal Myoview  . Diabetes mellitus   . CHF (congestive heart failure)    Past Surgical History  Procedure Laterality Date  . Tubal ligation    . Cholecystectomy    . Cesarean section    . Abdominal hysterectomy  09/10/2011    Procedure: HYSTERECTOMY ABDOMINAL;  Surgeon: Jonnie Kind, MD;  Location: [facility];  Service: Gynecology;  Laterality: N/A;  Abdominal hysterectomy  . Scar revision  09/10/2011    Procedure: SCAR REVISION;  Surgeon: Jonnie Kind, MD;  Location: [facility];  Service: Gynecology;  Laterality: N/A;  Wide Excision of old Cicatrix   Family History  Problem Relation Age of Onset  . Cirrhosis Mother   . Diabetes type II Father   . Diabetes type II Sister   . Anesthesia problems Neg Hx   . Hypotension Neg Hx   . Malignant hyperthermia Neg Hx   . Pseudochol deficiency  Neg Hx    History  Substance Use Topics  . Smoking status: Current Every Day Smoker -- 0.25 packs/day for 29 years    Types: Cigarettes  . Smokeless tobacco: Never Used     Comment: 1 cigarette a day  . Alcohol Use: 0.0 oz/week     Comment: occ   OB History   Grav Para Term Preterm Abortions TAB SAB Ect Mult Living                 Review of Systems A complete 10 system review of systems was obtained and all systems are negative except as noted in the HPI and PMH.   Allergies  Bee venom  Home Medications   Current Outpatient Rx  Name  Route  Sig  Dispense  Refill  . albuterol (PROVENTIL HFA;VENTOLIN HFA) 108 (90 BASE) MCG/ACT inhaler   Inhalation   Inhale 1-2 puffs into the lungs every 6 (six) hours as needed for wheezing.   1 Inhaler   0   . enalapril (VASOTEC) 20 MG tablet   Oral   Take 20 mg by mouth daily.         Marland Kitchen glimepiride (AMARYL) 4 MG tablet   Oral   Take 4 mg by mouth daily before breakfast.           . hydrochlorothiazide (HYDRODIURIL)  25 MG tablet   Oral   Take 25 mg by mouth daily.           Marland Kitchen ibuprofen (ADVIL,MOTRIN) 200 MG tablet   Oral   Take 400 mg by mouth every 6 (six) hours as needed for moderate pain.          . metFORMIN (GLUCOPHAGE) 1000 MG tablet   Oral   Take 1,000 mg by mouth 2 (two) times daily with a meal.           . omeprazole (PRILOSEC) 20 MG capsule   Oral   Take 20 mg by mouth daily.         Marland Kitchen HYDROcodone-acetaminophen (NORCO/VICODIN) 5-325 MG per tablet   Oral   Take 2 tablets by mouth every 4 (four) hours as needed.   10 tablet   0   . ibuprofen (ADVIL,MOTRIN) 800 MG tablet   Oral   Take 1 tablet (800 mg total) by mouth 3 (three) times daily.   21 tablet   0    BP 198/82  Pulse 65  Temp(Src) 97.8 F (36.6 C) (Oral)  Resp 16  Ht 5\' 7"  (1.702 m)  Wt 215 lb (97.523 kg)  BMI 33.67 kg/m2  SpO2 99%  LMP 08/21/2011 Physical Exam  Nursing note and vitals reviewed. Constitutional: She is oriented to  [place], place, [name]  Uncomfortable appearing   HENT:  Head: Normocephalic and atraumatic.  Cardiovascular: Normal rate, regular rhythm and normal heart sounds.   Pulmonary/Chest: Effort normal and breath sounds normal. No respiratory distress. She has no wheezes. She has no rales. She exhibits tenderness.  Point tenderness to left chest wall.    Abdominal: Soft. She exhibits no distension. There is no tenderness.  Neurological: She is alert and oriented to [place], place, and time.  Skin: Skin is warm and dry.  Psychiatric: She has a normal mood and affect.    ED Course  Procedures (including critical care time)  DIAGNOSTIC STUDIES: Oxygen Saturation is 97% on RA, adequate by my interpretation.    COORDINATION OF CARE: 12:23 PM Discussed treatment plan with pt at bedside and pt agreed to plan.   Labs Review Labs Reviewed  CBC WITH DIFFERENTIAL - Abnormal; Notable for the following:    Hemoglobin 15.1 (*)    All other components within normal limits  COMPREHENSIVE METABOLIC PANEL - Abnormal; Notable for the following:    Glucose, Bld 341 (*)    All other components within normal limits  D-DIMER, QUANTITATIVE - Abnormal; Notable for the following:    D-Dimer, Quant 0.64 (*)    All other components within normal limits  PRO B NATRIURETIC PEPTIDE - Abnormal; Notable for the following:    Pro B Natriuretic peptide (BNP) 298.1 (*)    All other components within normal limits  URINALYSIS, ROUTINE W REFLEX MICROSCOPIC - Abnormal; Notable for the following:    Specific Gravity, Urine >1.030 (*)    Glucose, UA >1000 (*)    All other components within normal limits  GLUCOSE, CAPILLARY - Abnormal; Notable for the following:    Glucose-Capillary 259 (*)    All other components within normal limits  TROPONIN I  TROPONIN I  URINE MICROSCOPIC-ADD ON   Imaging Review Dg Chest 2 View  08/18/2013   CLINICAL DATA:  Chest pressure and  chest pain on the left side. Shortness of breath. COPD. Congestive heart failure.  EXAM: CHEST  2 VIEW  COMPARISON:  06/16/2013  FINDINGS: Stable mild to moderate cardiomegaly with prominence of central pulmonary vasculature. No edema. Mild central airway thickening. No significant airspace opacity or pleural effusion.  IMPRESSION: 1. Airway thickening is present, suggesting bronchitis or reactive airways disease. 2. Stable mild to moderate cardiomegaly. Prominent central pulmonary vasculature suggests pulmonary arterial hypertension. 3. Thoracic spondylosis.   Electronically Signed   By: Sherryl Barters M.D.   On: 08/18/2013 13:19   Ct Angio Chest Aorta W/cm &/or Wo/cm  08/18/2013   CLINICAL DATA:  Chest pain  EXAM: CT ANGIOGRAPHY CHEST WITH CONTRAST  TECHNIQUE: Multidetector CT images were obtained through the chest prior to intravenous contrast maternal administration. Multidetector CT imaging of the chest was then performed using the standard protocol during bolus administration of intravenous contrast. Multiplanar CT image reconstructions including MIPs were obtained to evaluate the vascular anatomy.  CONTRAST:  12mL OMNIPAQUE IOHEXOL 350 MG/ML SOLN  COMPARISON:  Chest radiograph August 18, 2013  FINDINGS: There is no appreciable thoracic aortic dissection. The ascending thoracic aorta is prominent measuring 3.9 by 4.0 cm in size. There is no evidence suggesting of aortic ulceration. There is no demonstrable pulmonary embolus.  On axial slice 42, series 6, there is a 2 mm nodular opacity in the superior segment of the left lower lobe. There is no edema or consolidation.  There is no appreciable thoracic adenopathy. The pericardium is not thickened. There is left ventricular hypertrophy.  Visualized upper abdominal structures appear normal. The superior mesenteric, celiac, and renal arteries are all widely patent. There are no blastic or lytic bone lesions.  Review of the MIP images confirms the above  findings.  IMPRESSION: Mild prominence of the ascending aorta. There is no demonstrable thoracic or upper abdominal aortic dissection. No demonstrable pulmonary embolus.  No edema or consolidation is apparent. There is a 2 mm nodular opacity in superior segment left lower lobe. Followup of this nodular opacity should be based on Fleischner Society guidelines. If the patient is at high risk for bronchogenic carcinoma, follow-up chest CT at 1year is recommended. If the patient is at low risk, no follow-up is needed. This recommendation follows the consensus statement: Guidelines for Management of Small Pulmonary Nodules Detected on CT Scans: A Statement from the [employer] as published in Radiology 2005; 237:395-400.   Electronically Signed   By: Lowella Grip M.D.   On: 08/18/2013 17:06    EKG Interpretation    Date/Time:  Wednesday August 18 2013 11:44:28 EST Ventricular Rate:  79 PR Interval:  168 QRS Duration: 84 QT Interval:  410 QTC Calculation: 470 R Axis:   53 Text Interpretation:  Normal sinus rhythm Possible Left atrial enlargement Cannot rule out Anterior infarct , age undetermined Abnormal ECG When compared with ECG of 14-Feb-2013 16:46, No significant change was found No significant change was found Confirmed by Wyvonnia Dusky  MD, Tenita Cue ([id number]) on 08/18/2013 12:09:31 PM            MDM   1. Chest wall pain   2. Hypertension    2 day history of left-sided chest pain that does not radiate. Worse with palpation and worse with movement. Denies any shortness of breath, dizziness, lightheadedness.  Catheterization 2010 was normal. EKG unchanged. With anterior Q waves. Troponin negative x2/  D-dimer elevated. Patient's pain is reproducible on exam and palpation of chest wall and movement of L arm. Atypical for ACS or PE.   CT angio pending at time of sign out to Dr. Lacinda Axon.  Anticipate discharge  home with chest wall pain.  I personally performed the services described in  this documentation, which was scribed in my presence. The recorded information has been reviewed and is accurate.   Robin Essex, MD 08/18/13 [phone]

## 2013-08-18 NOTE — Discharge Instructions (Signed)
Chest Wall Pain There is no evidence of heart attack or blood clot in the lung. Take the pain medication as prescribed. Return to the ED if you develop new or worsening symptoms. Chest wall pain is pain in or around the bones and muscles of your chest. It may take up to 6 weeks to get better. It may take longer if you must stay physically active in your work and activities.  CAUSES  Chest wall pain may happen on its own. However, it may be caused by:  A viral illness like the flu.  Injury.  Coughing.  Exercise.  Arthritis.  Fibromyalgia.  Shingles. HOME CARE INSTRUCTIONS   Avoid overtiring physical activity. Try not to strain or perform activities that cause pain. This includes any activities using your chest or your abdominal and side muscles, especially if heavy weights are used.  Put ice on the sore area.  Put ice in a plastic bag.  Place a towel between your skin and the bag.  Leave the ice on for 15-20 minutes per hour while awake for the first 2 days.  Only take over-the-counter or prescription medicines for pain, discomfort, or fever as directed by your caregiver. SEEK IMMEDIATE MEDICAL CARE IF:   Your pain increases, or you are very uncomfortable.  You have a fever.  Your chest pain becomes worse.  You have new, unexplained symptoms.  You have nausea or vomiting.  You feel sweaty or lightheaded.  You have a cough with phlegm (sputum), or you cough up blood. MAKE SURE YOU:   Understand these instructions.  Will watch your condition.  Will get help right away if you are not doing well or get worse. Document Released: 07/22/2005 Document Revised: 10/14/2011 Document Reviewed: 03/18/2011 [facility] Patient Information 2014 [place], [place].   You have a small 2 mm nodule in your left lower lung. Recommend repeat CT scan of your chest in one year. Medications for pain.  Followup your primary care Dr. or return if worse.

## 2013-08-18 NOTE — ED Notes (Signed)
Pt c/o left side chest aching/sharp pains since yesterday. Pain worse with palpation and moving. Denies sob/dizziness/radiation. Nad. Nondipahoretic.

## 2013-08-18 NOTE — ED Notes (Signed)
Pt urine sample mislabeled. Lab reported additional urine sample needed. Pt aware and verbalized understanding.

## 2013-08-18 NOTE — ED Provider Notes (Signed)
Discussed CT results with patient. She understands to return in one year for repeat CT scan chest secondary to 2 mm nodule in left lung. Discharge medications include Norco and ibuprofen 800 mg  Nat Christen, MD 08/18/13 [phone]

## 2013-09-16 ENCOUNTER — Emergency Department (HOSPITAL_COMMUNITY): Payer: Self-pay

## 2013-09-16 ENCOUNTER — Emergency Department (HOSPITAL_COMMUNITY)
Admission: EM | Admit: 2013-09-16 | Discharge: 2013-09-16 | Disposition: A | Discharge status: Home / Self Care | Payer: Self-pay | Attending: Emergency Medicine | Admitting: Emergency Medicine

## 2013-09-16 ENCOUNTER — Encounter (HOSPITAL_COMMUNITY): Payer: Self-pay | Admitting: Emergency Medicine

## 2013-09-16 DIAGNOSIS — J449 Chronic obstructive pulmonary disease, unspecified: Secondary | ICD-10-CM | POA: Insufficient documentation

## 2013-09-16 DIAGNOSIS — Z9851 Tubal ligation status: Secondary | ICD-10-CM | POA: Insufficient documentation

## 2013-09-16 DIAGNOSIS — E119 Type 2 diabetes mellitus without complications: Secondary | ICD-10-CM | POA: Insufficient documentation

## 2013-09-16 DIAGNOSIS — R197 Diarrhea, unspecified: Secondary | ICD-10-CM | POA: Insufficient documentation

## 2013-09-16 DIAGNOSIS — Z9089 Acquired absence of other organs: Secondary | ICD-10-CM | POA: Insufficient documentation

## 2013-09-16 DIAGNOSIS — R112 Nausea with vomiting, unspecified: Secondary | ICD-10-CM | POA: Insufficient documentation

## 2013-09-16 DIAGNOSIS — J069 Acute upper respiratory infection, unspecified: Secondary | ICD-10-CM | POA: Insufficient documentation

## 2013-09-16 DIAGNOSIS — M129 Arthropathy, unspecified: Secondary | ICD-10-CM | POA: Insufficient documentation

## 2013-09-16 DIAGNOSIS — K219 Gastro-esophageal reflux disease without esophagitis: Secondary | ICD-10-CM | POA: Insufficient documentation

## 2013-09-16 DIAGNOSIS — J4489 Other specified chronic obstructive pulmonary disease: Secondary | ICD-10-CM | POA: Insufficient documentation

## 2013-09-16 DIAGNOSIS — I509 Heart failure, unspecified: Secondary | ICD-10-CM | POA: Insufficient documentation

## 2013-09-16 DIAGNOSIS — I10 Essential (primary) hypertension: Secondary | ICD-10-CM | POA: Insufficient documentation

## 2013-09-16 DIAGNOSIS — Z791 Long term (current) use of non-steroidal anti-inflammatories (NSAID): Secondary | ICD-10-CM | POA: Insufficient documentation

## 2013-09-16 DIAGNOSIS — F17200 Nicotine dependence, unspecified, uncomplicated: Secondary | ICD-10-CM | POA: Insufficient documentation

## 2013-09-16 DIAGNOSIS — Z90710 Acquired absence of both cervix and uterus: Secondary | ICD-10-CM | POA: Insufficient documentation

## 2013-09-16 DIAGNOSIS — Z79899 Other long term (current) drug therapy: Secondary | ICD-10-CM | POA: Insufficient documentation

## 2013-09-16 DIAGNOSIS — R1084 Generalized abdominal pain: Secondary | ICD-10-CM | POA: Insufficient documentation

## 2013-09-16 LAB — URINALYSIS, ROUTINE W REFLEX MICROSCOPIC
Bilirubin Urine: NEGATIVE
Glucose, UA: 500 mg/dL — AB
Hgb urine dipstick: NEGATIVE
Ketones, ur: NEGATIVE mg/dL
Leukocytes, UA: NEGATIVE
Nitrite: NEGATIVE
Specific Gravity, Urine: 1.01 (ref 1.005–1.030)
Urobilinogen, UA: 0.2 mg/dL (ref 0.0–1.0)
pH: 6.5 (ref 5.0–8.0)

## 2013-09-16 LAB — COMPREHENSIVE METABOLIC PANEL
ALT: 19 U/L (ref 0–35)
AST: 23 U/L (ref 0–37)
Albumin: 3.7 g/dL (ref 3.5–5.2)
Alkaline Phosphatase: 94 U/L (ref 39–117)
BUN: 11 mg/dL (ref 6–23)
CO2: 25 mEq/L (ref 19–32)
Calcium: 9.2 mg/dL (ref 8.4–10.5)
Chloride: 99 mEq/L (ref 96–112)
Creatinine, Ser: 0.67 mg/dL (ref 0.50–1.10)
GFR calc Af Amer: >90 mL/min (ref >90)
GFR calc non Af Amer: >90 mL/min (ref >90)
Glucose, Bld: 266 mg/dL — ABNORMAL HIGH (ref 70–99)
Potassium: 3.8 mEq/L (ref 3.7–5.3)
Sodium: 138 mEq/L (ref 137–147)
Total Bilirubin: 0.4 mg/dL (ref 0.3–1.2)
Total Protein: 7.7 g/dL (ref 6.0–8.3)

## 2013-09-16 LAB — CBC WITH DIFFERENTIAL/PLATELET
Basophils Absolute: 0 10*3/uL (ref 0.0–0.1)
Basophils Relative: 0 % (ref 0–1)
Eosinophils Absolute: 0.1 10*3/uL (ref 0.0–0.7)
Eosinophils Relative: 1 % (ref 0–5)
HCT: 47.8 % — ABNORMAL HIGH (ref 36.0–46.0)
Hemoglobin: 16.5 g/dL — ABNORMAL HIGH (ref 12.0–15.0)
Lymphocytes Relative: 43 % (ref 12–46)
Lymphs Abs: 2.2 10*3/uL (ref 0.7–4.0)
MCH: 30.2 pg (ref 26.0–34.0)
MCHC: 34.5 g/dL (ref 30.0–36.0)
MCV: 87.5 fL (ref 78.0–100.0)
Monocytes Absolute: 0.6 10*3/uL (ref 0.1–1.0)
Monocytes Relative: 11 % (ref 3–12)
Neutro Abs: 2.3 10*3/uL (ref 1.7–7.7)
Neutrophils Relative %: 45 % (ref 43–77)
Platelets: 208 10*3/uL (ref 150–400)
RBC: 5.46 MIL/uL — ABNORMAL HIGH (ref 3.87–5.11)
RDW: 12.9 % (ref 11.5–15.5)
WBC: 5.1 10*3/uL (ref 4.0–10.5)

## 2013-09-16 LAB — CLOSTRIDIUM DIFFICILE BY PCR: Toxigenic C. Difficile by PCR: NEGATIVE

## 2013-09-16 LAB — RAPID STREP SCREEN (MED CTR MEBANE ONLY): Streptococcus, Group A Screen (Direct): NEGATIVE

## 2013-09-16 LAB — LIPASE, BLOOD: Lipase: 21 U/L (ref 11–59)

## 2013-09-16 LAB — URINE MICROSCOPIC-ADD ON

## 2013-09-16 IMAGING — CR DG CHEST 2V
2 series · 2 of 2 positions shown · non-contrast
Comparison: [DATE].

CLINICAL DATA: Hypertension.

EXAM:
CHEST  2 VIEW

[view not recorded (1 of 2)]
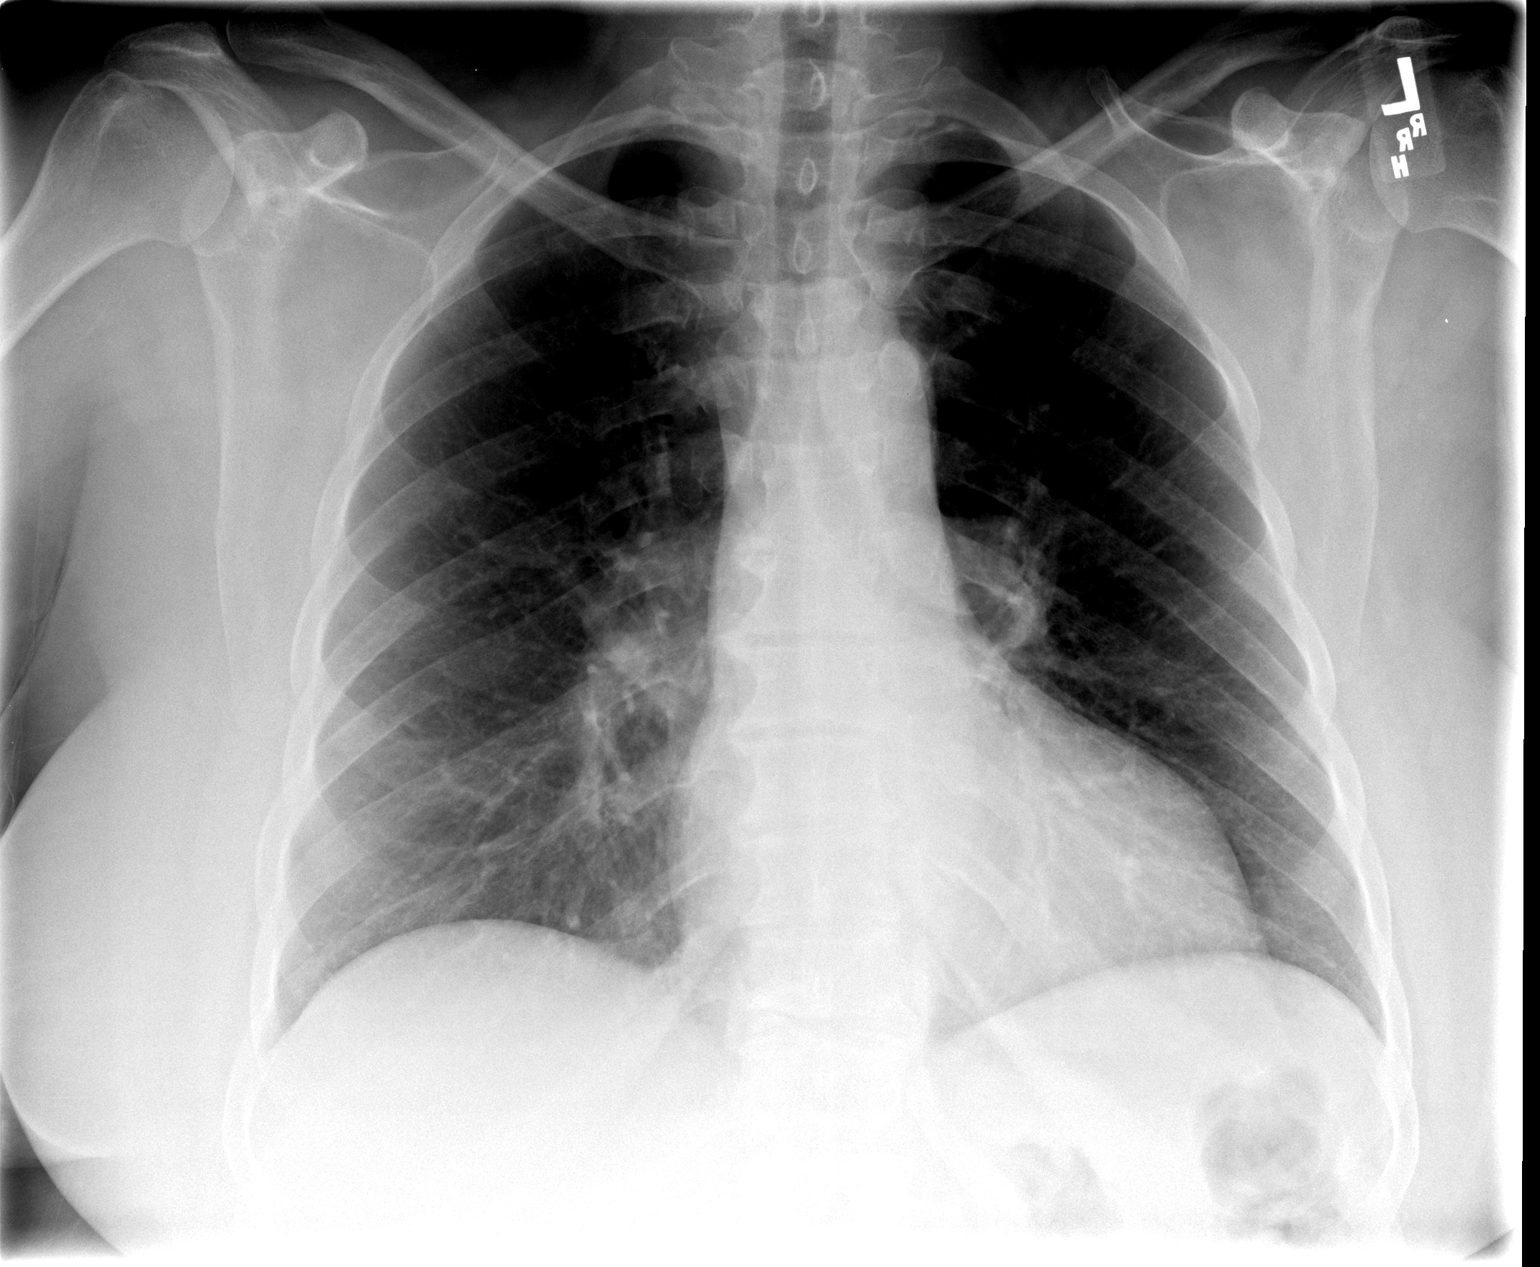

[view not recorded (2 of 2)]
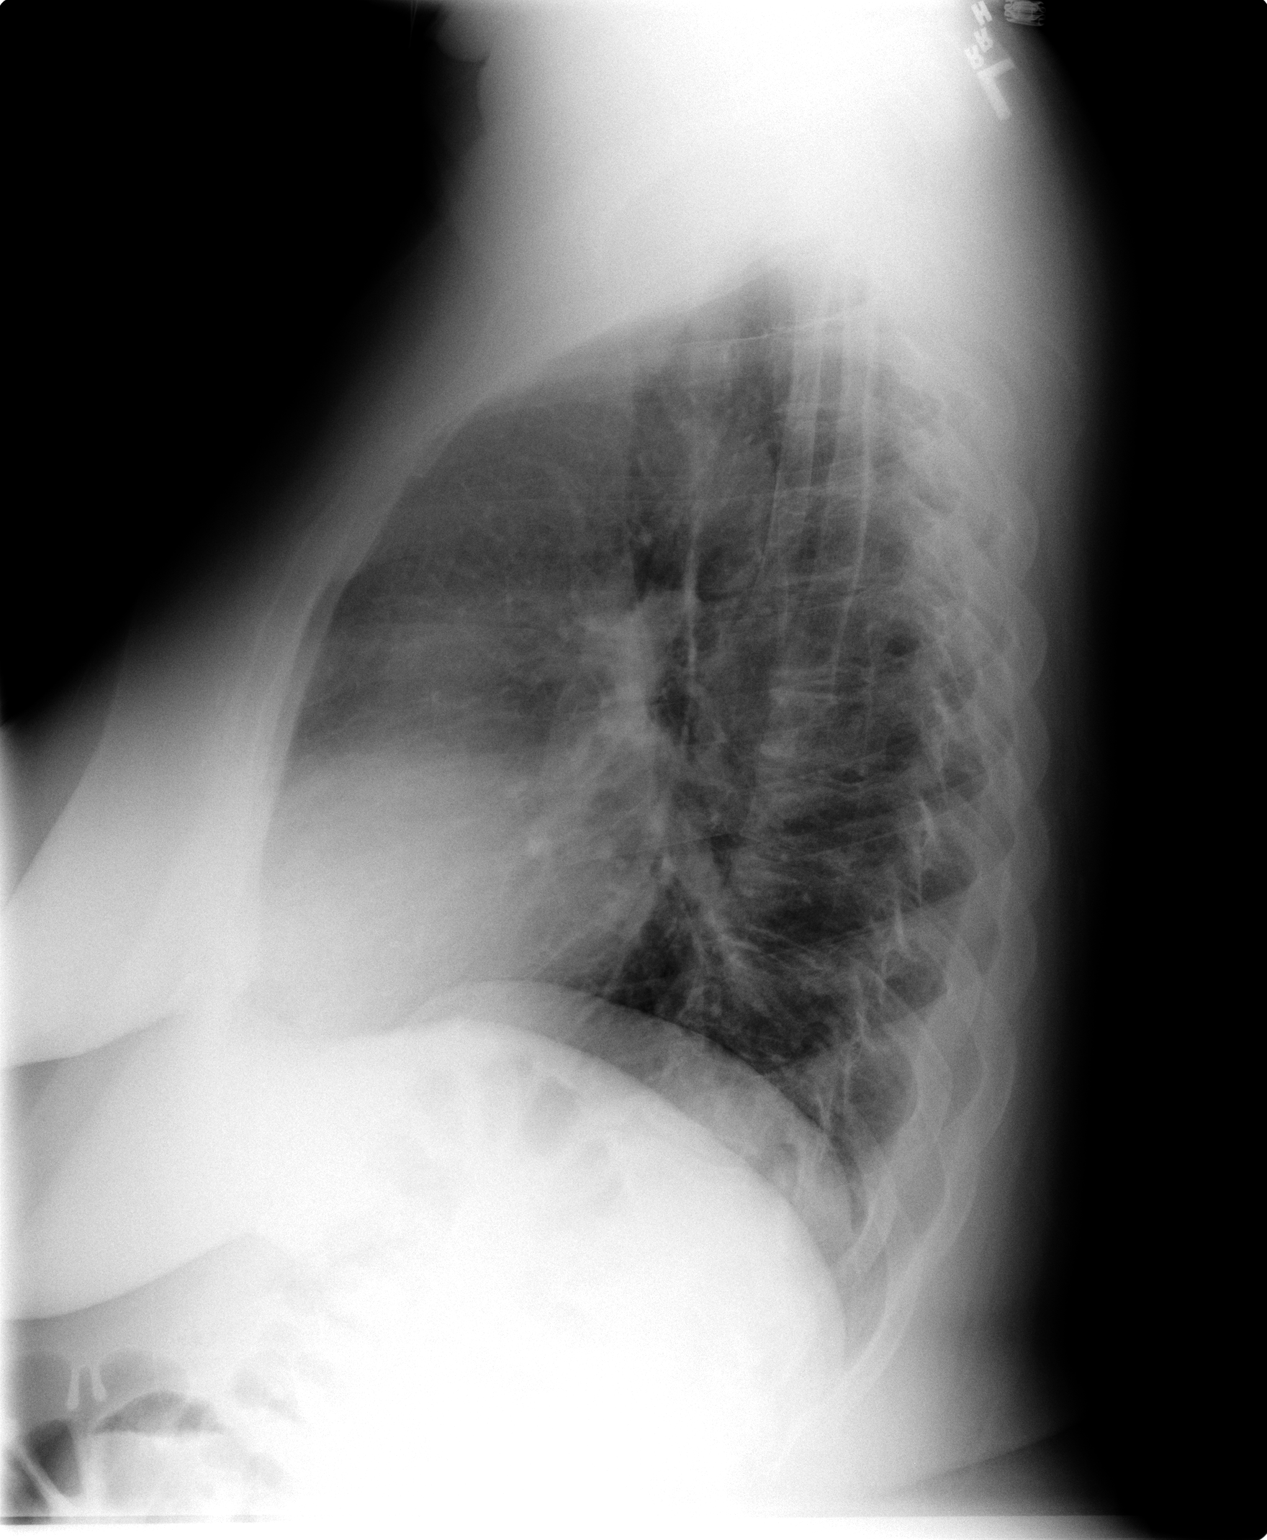

[2 of 2 positions shown; findings below may reference images not displayed]

FINDINGS: Stable cardiomediastinal silhouette. No pleural effusion or
pneumothorax is noted. No acute pulmonary disease is noted. Bony
thorax is intact.
IMPRESSION: No active cardiopulmonary disease.

## 2013-09-16 IMAGING — CT CT ABD-PELV W/ CM
2 of 5 series · 17 of 46 positions shown, 19 images · IV contrast (omnipaque)
Comparison: [DATE]

CLINICAL DATA: Abdominal pain, nausea, vomiting, diarrhea, history
hypertension, CHF, diabetes, COPD

EXAM:
CT ABDOMEN AND PELVIS WITH CONTRAST
TECHNIQUE: Multidetector CT imaging of the abdomen and pelvis was performed
using the standard protocol following bolus administration of
intravenous contrast. Sagittal and coronal MPR images reconstructed
from axial data set.
CONTRAST:  50mL OMNIPAQUE IOHEXOL 300 MG/ML SOLN ORALLY, 100mL
OMNIPAQUE IOHEXOL 300 MG/ML SOLN IV

[Series 2: abd_pel_with 5.0 b40f · axial · 0.83mm/px · z∈[-452,-72]mm · 14 of 86 slices shown, 16 images]
[im 5/86  soft-tissue]
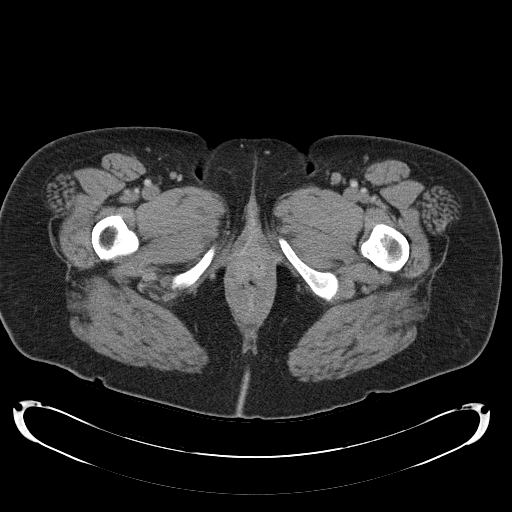
[im 5/86  bone]
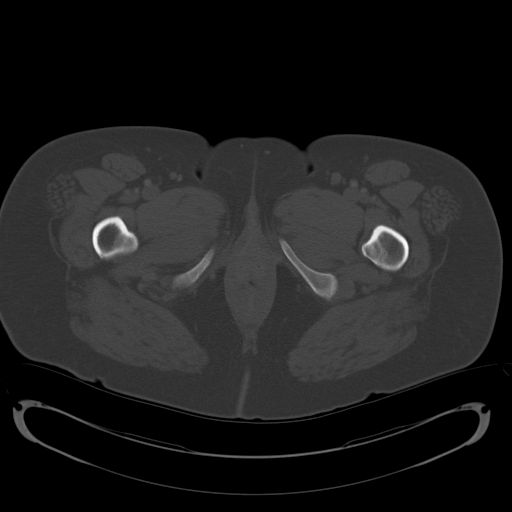
[im 10/86  soft-tissue]
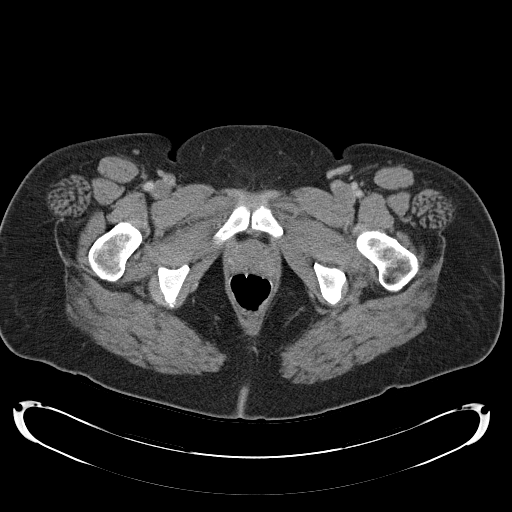
[im 19/86  soft-tissue]
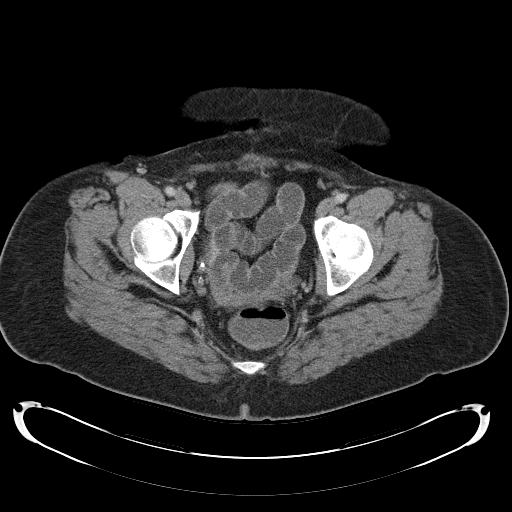
[im 24/86  soft-tissue]
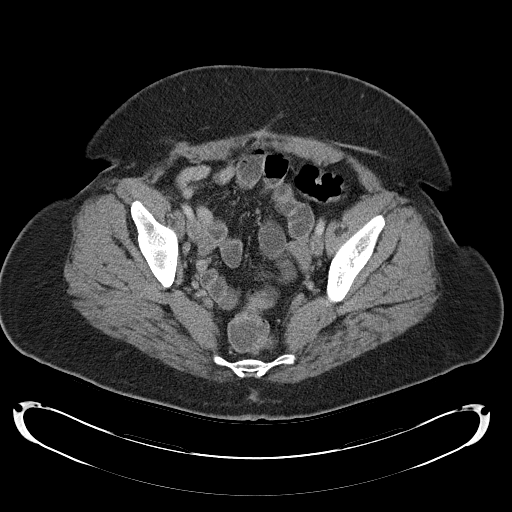
[im 29/86  soft-tissue]
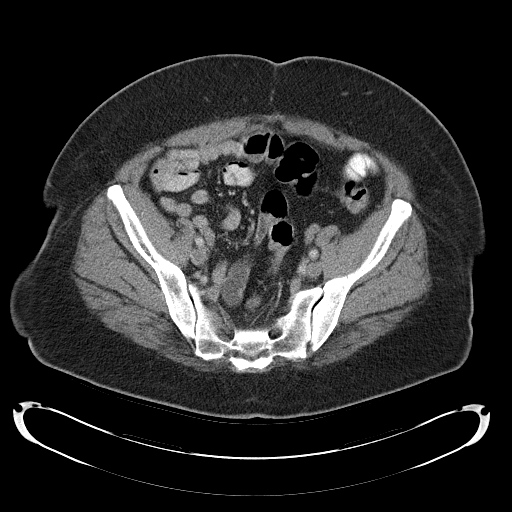
[im 34/86  soft-tissue]
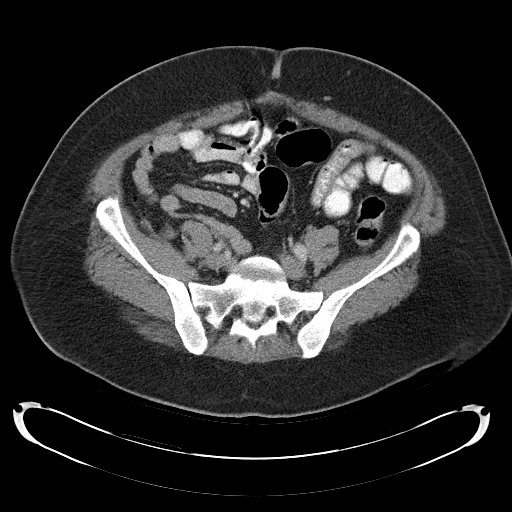
[im 38/86  soft-tissue]
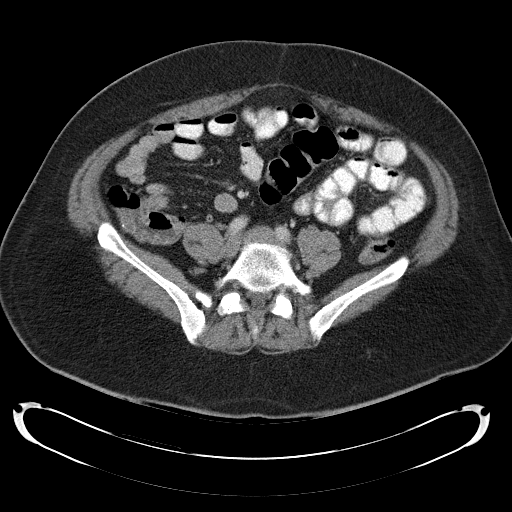
[im 48/86  soft-tissue]
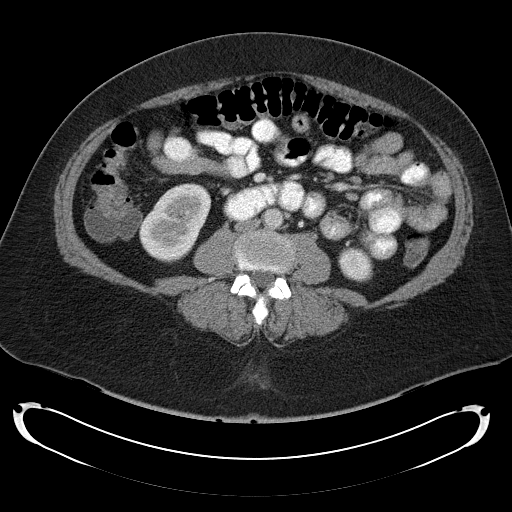
[im 52/86  soft-tissue]
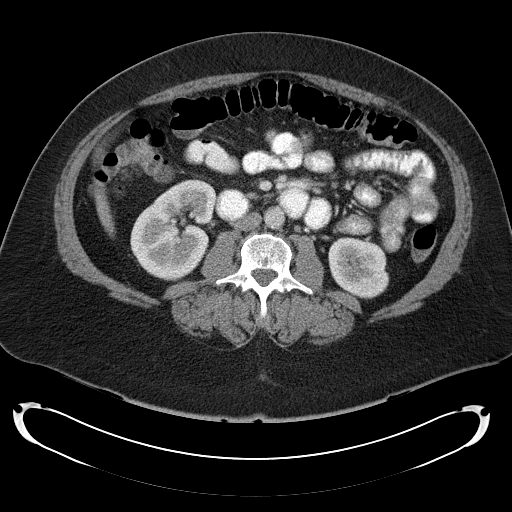
[im 52/86  bone]
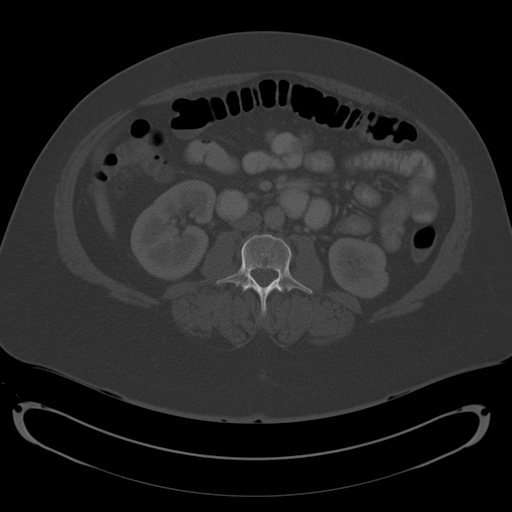
[im 57/86  soft-tissue]
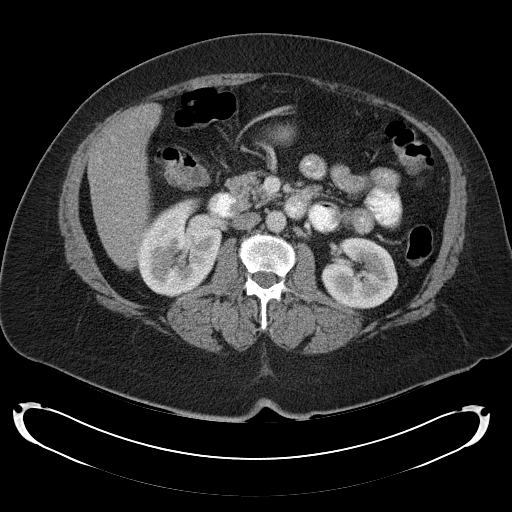
[im 62/86  soft-tissue]
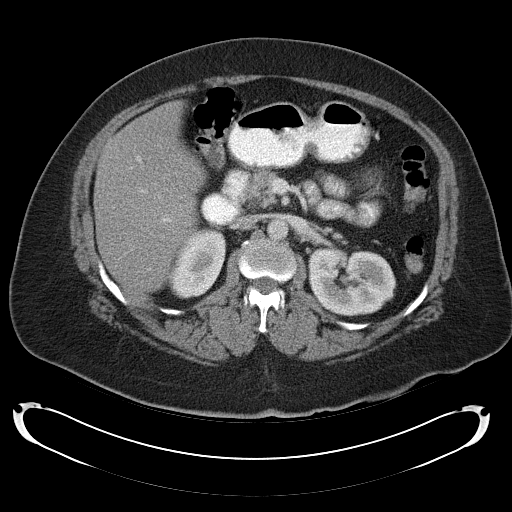
[im 67/86  soft-tissue]
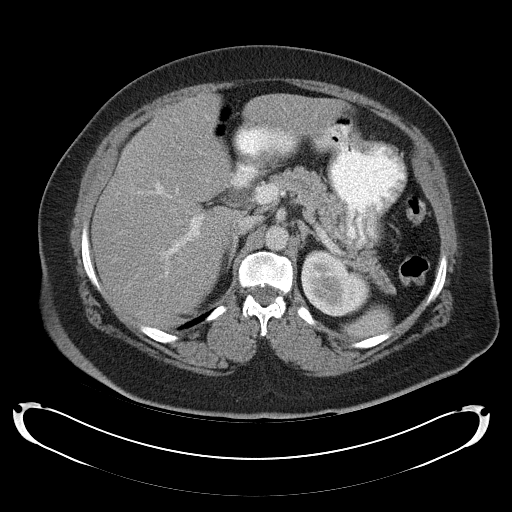
[im 76/86  soft-tissue]
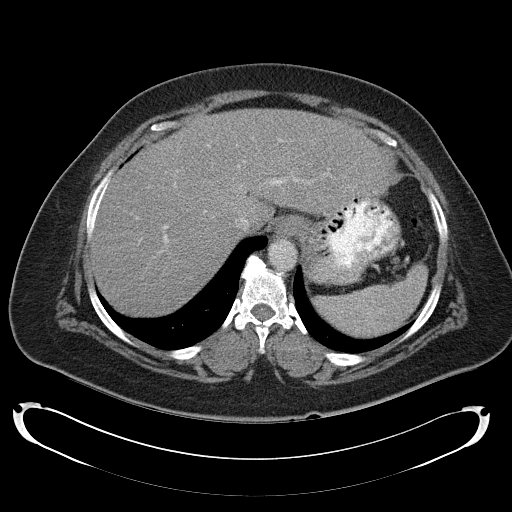
[im 81/86  soft-tissue]
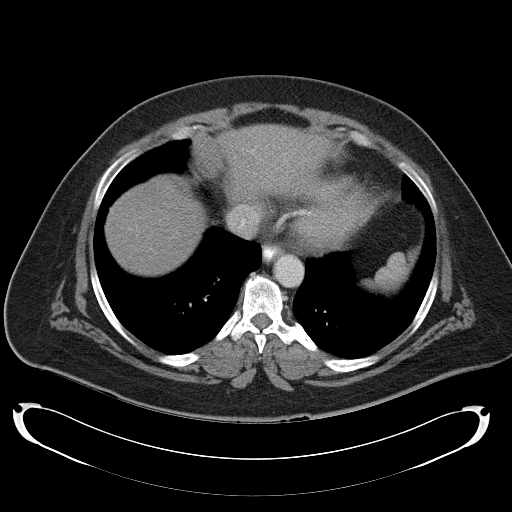

[Series 4: abd_pel_with 3.0 spo · coronal · 0.80mm/px · 3 of 100 slices shown]
[im 34/100  soft-tissue]
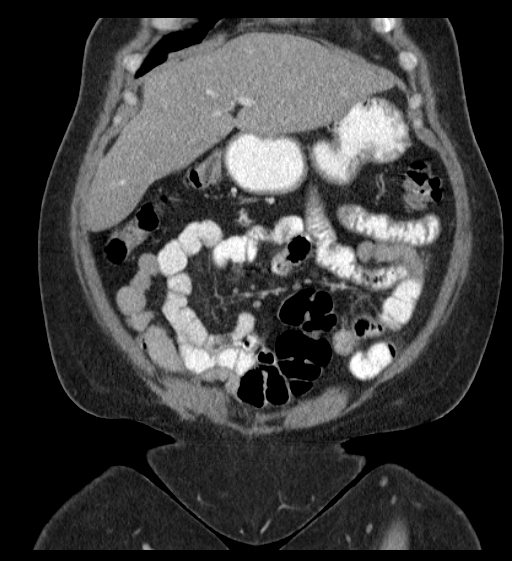
[im 45/100  soft-tissue]
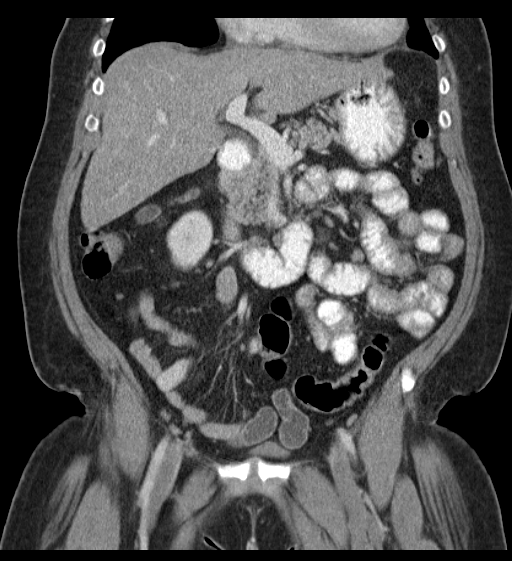
[im 56/100  soft-tissue]
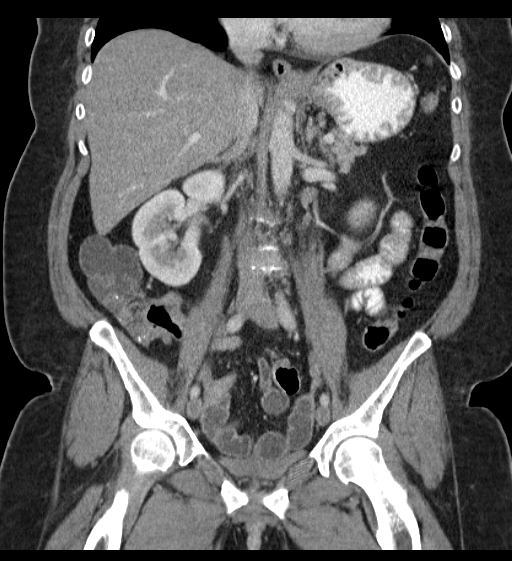

[17 of 46 positions shown; findings below may reference images not displayed]

FINDINGS: Lung bases clear.

Gallbladder surgically absent.

Fatty infiltration of liver.

Liver, spleen, pancreas, kidneys, and adrenal glands otherwise
normal.

Normal appendix.

Few normal-sized lymph nodes at gastrosplenic ligament and
gastrohepatic ligament.

Stomach and bowel loops unremarkable.

Uterus surgically absent with normal sized ovaries.

Unremarkable bladder and ureters.

No mass, adenopathy, free fluid or inflammatory process.

No hernia or acute bone lesion.
IMPRESSION: Fatty infiltration of liver.

No acute intra-abdominal or intrapelvic abnormalities.

## 2013-09-16 MED ORDER — IOHEXOL 300 MG/ML  SOLN
50.0000 mL | Freq: Once | INTRAMUSCULAR | Status: AC | PRN
  Administered 2013-09-16: 50 mL via ORAL

## 2013-09-16 MED ORDER — MORPHINE SULFATE 4 MG/ML IJ SOLN
4.0000 mg | INTRAMUSCULAR | Status: AC | PRN
  Administered 2013-09-16 (×2): 4 mg via INTRAVENOUS
  Filled 2013-09-16 (×2): qty 1

## 2013-09-16 MED ORDER — SODIUM CHLORIDE 0.9 % IV BOLUS (SEPSIS)
250.0000 mL | Freq: Once | INTRAVENOUS | Status: AC
  Administered 2013-09-16: 250 mL via INTRAVENOUS

## 2013-09-16 MED ORDER — ONDANSETRON HCL 4 MG/2ML IJ SOLN
4.0000 mg | INTRAMUSCULAR | Status: DC | PRN
  Administered 2013-09-16: 4 mg via INTRAVENOUS
  Filled 2013-09-16: qty 2

## 2013-09-16 MED ORDER — ONDANSETRON HCL 4 MG PO TABS: 4.0000 mg | ORAL_TABLET | Freq: Three times a day (TID) | ORAL | Status: DC | PRN

## 2013-09-16 MED ORDER — BENZONATATE 100 MG PO CAPS: 100.0000 mg | ORAL_CAPSULE | Freq: Three times a day (TID) | ORAL | Status: DC | PRN

## 2013-09-16 MED ORDER — SODIUM CHLORIDE 0.9 % IV SOLN
INTRAVENOUS | Status: DC
  Administered 2013-09-16: 09:00:00 via INTRAVENOUS

## 2013-09-16 MED ORDER — IOHEXOL 300 MG/ML  SOLN
100.0000 mL | Freq: Once | INTRAMUSCULAR | Status: AC | PRN
  Administered 2013-09-16: 100 mL via INTRAVENOUS

## 2013-09-16 NOTE — Discharge Instructions (Signed)
°Emergency Department Resource Guide °1) Find a Doctor and Pay Out of Pocket °Although you won't have to find out who is covered by your insurance plan, it is a good idea to ask around and get recommendations. You will then need to call the office and see if the doctor you have chosen will accept you as a new patient and what types of options they offer for patients who are self-pay. Some doctors offer discounts or will set up payment plans for their patients who do not have insurance, but you will need to ask so you aren't surprised when you get to your appointment. ° °2) Contact Your Local Health Department °Not all health departments have doctors that can see patients for sick visits, but many do, so it is worth a call to see if yours does. If you don't know where your local health department is, you can check in your phone book. The CDC also has a tool to help you locate your state's health department, and many state websites also have listings of all of their local health departments. ° °3) Find a Walk-in Clinic °If your illness is not likely to be very severe or complicated, you may want to try a walk in clinic. These are popping up all over the country in pharmacies, drugstores, and shopping centers. They're usually staffed by nurse practitioners or physician assistants that have been trained to treat common illnesses and complaints. They're usually fairly quick and inexpensive. However, if you have serious medical issues or chronic medical problems, these are probably not your best option. ° °No Primary Care Doctor: °- Call [facility] at  832-8000 - they can help you locate a primary care doctor that  accepts your insurance, provides certain services, etc. °- Physician Referral Service- 1-800-533-3463 ° °Chronic Pain Problems: °Organization         Address  Phone   Notes  ° [facility]  (336) 297-2271 Patients need to be referred by their primary care doctor.  ° °Medication  Assistance: °Organization         Address  Phone   Notes  °[place] [facility] [address] °[place], [place] [address] (336) 641-8030 --Must be a resident of [place] °-- Must have NO insurance coverage whatsoever (no Medicaid/ Medicare, etc.) °-- The pt. MUST have a primary care doctor that directs their care regularly and follows them in the community °  °[facility]  (866) 331-1348   °[employer]  (888) 892-1162   ° °Agencies that provide inexpensive medical care: °Organization         Address  Phone   Notes  °[facility]  (336) 832-8035   °[facility]    (336) 832-7272   °[facility] [address] °[place], [place] [address] (336) 832-4777   °[facility] [address], °[place] (336) 271-4999   °[facility]    (336) 373-0678   °[facility]    (336) 272-1050   °[facility] ° [address],  Phone:  (336) 832-4444, Fax:  (336) 832-4440 Hours of Operation:  9 am - 6 pm, M-F.  Also accepts Medicaid/Medicare and self-pay.  °[facility] ° [address],  Phone: (336) 832-3150, Fax: (336) 832-3151. Hours of Operation:  8:30 am - 5:30 pm, M-F.  Also accepts Medicaid and self-pay.  °[facility] [place] [address], [place] Phone: (336) 878-6027   °[facility] [address], [place] (336)723-1848, Ext. 123 Mondays & Thursdays: 7-9 AM.  First 15 patients are seen on a first come, first serve basis. °  ° °Medicaid-accepting [place] Providers: ° °Organization         Address  Phone   Notes  °[facility] [address], [place] (336) 641-2100 Also accepts self-pay patients.  °[facility] [address], [place] ° (336) 856-9996   °[facility] [address], [place]  (336) 288-8857   °[facility] [address] [place] [address], [place] (336) 299-7000   °Veita Bland [address], [place]  ° (336) 373-1557 Only accepts [place] Access Medicaid patients after they have their name applied to their card.  ° °Self-Pay (no insurance) in [place]: ° °Organization         Address  Phone   Notes  °Sickle Cell Patients, [facility] [address], [place] (336) 832-1970   °[facility] [address], [place] (336) 832-4400   °[facility] [place] ° [address], [place] (336) 992-4800   °[facility]/Dr. Osei-Bonsu ° [address] [place] [address], [place] or [address], [place] (336) 841-8500 Phone number for both [place] and [place] locations is the same.  °[facility] [address], [place] (336) 299-0000   °[facility] [address] [place] [address], [place] or [address] (336) 852-7530 °(336) 878-2260   °[facility] [address], [place] (336) 350-1642, phone; (336) 294-5005, fax Sees patients 1st and 3rd Saturday of every month.  Must not qualify for public or private insurance (i.e. Medicaid, Medicare, [place] Health Choice, Veterans' Benefits) • Household income should be no more than 200% of the poverty level •The clinic cannot treat you if you are pregnant or think you are pregnant • Sexually transmitted diseases are not treated at the clinic.  ° ° °Dental Care: °Organization         Address  Phone  Notes  °[place] [facility] [address], [place] (336) 641-6152 Accepts children up to age 21 who are enrolled in Medicaid or [place] Health Choice; pregnant women with a Medicaid card; and children who have applied for Medicaid or [place] Health Choice, but were declined, whose parents can pay a reduced fee at time of service.  °[place]  [facility] [place]  [address], [place] (336) 641-7733 Accepts children up to age 21 who are enrolled in Medicaid or [place] Health Choice; pregnant women with a Medicaid card; and children who have applied for Medicaid or  Health Choice, but were declined, whose parents can pay a reduced fee at time of service.  °[facility] ° [address], [place] (336) 641-4533 Patients are seen by appointment only. Walk-ins are not accepted. [facility] will see patients 18 years of age and older. °Monday - Tuesday (8am-5pm) °Most Wednesdays (8:30-5pm) °$30 per visit, cash only  °[facility] ° [address], [place] (336) 641-4533 Patients are seen by appointment only. Walk-ins are not accepted. [facility] will see patients 18 years of age and older. °One   Wednesday Evening (Monthly: Volunteer Based).  $30 per visit, cash only  °[facility]  (919) 537-3737 for adults; Children under age 4, call Graduate Pediatric Dentistry at ([phone]) [phone]. Children aged 4-14, please call ([phone]) [phone] to request a pediatric application. ° Dental services are provided in all areas of dental care including fillings, crowns and bridges, complete and partial dentures, implants, gum treatment, root canals, and extractions. Preventive care is also provided. Treatment is provided to both adults and children. °Patients are selected via a lottery and there is often a waiting list. °  °[facility] [address], °[place] ° (336) 763-8833 [email or web address] °  °[facility] [address], [place], [place] (336)723-1848, Ext. 123 Second and Fourth Thursday of each month, opens at 6:30 AM; Clinic ends at 9 AM.  Patients are seen on a first-come first-served basis, and a limited number are seen during each clinic.  ° °[facility] ° [address], [place], [place] (336) 723-7904    Eligibility Requirements °You must have lived in [place], or [place] counties for at least the last three months. °  You cannot be eligible for state or federal sponsored healthcare insurance, including Veterans Administration, Medicaid, or Medicare. °  You generally cannot be eligible for healthcare insurance through your employer.  °  How to apply: °Eligibility screenings are held every Tuesday and Wednesday afternoon from 1:00 pm until 4:00 pm. You do not need an appointment for the interview!  °[facility] [address], [place], [place] 336-631-2330   °[facility]  336-342-8273   °[facility]  336-703-3100   °[facility]  336-570-6415   ° °Behavioral Health Resources in the Community: °Intensive Outpatient Programs °Organization         Address  Phone  Notes  °[facility] [address], [place], [place] 336-878-6098   °[facility] [address], [place], [place] 336-832-9800   °[facility] [address], [place], [place] ° 336-882-2125   °[facility] [address],  °[place], [place] 1-800-853-5163 or 336-641-4981   °Substance Abuse Resources °Organization         Address  Phone  Notes  °[facility]  336-882-2125   °[facility]  336-784-9470   °[facility]  336-285-9073   °[facility]  336-845-3988   °Residential & Outpatient Substance Abuse Program  1-800-659-3381   °Psychological Services °Organization         Address  Phone  Notes  °[facility]  336- 832-9600   °[facility]  336- 378-7881   °[facility] [address], [place] 1-800-853-5163 or 336-641-4981   ° °Mobile Crisis Teams °Organization         Address  Phone  Notes  °[facility], Mobile Crisis Care Unit  1-877-626-1772   °Assertive °[facility] ° [address]  [place], [place] 336-834-9664   °Sharon DeEsch [address] °[place] 336-554-5454   ° °Self-Help/Support Groups °Organization         Address  Phone             Notes  °[facility] [place] - variety of support groups  336- 373-1402 Call for more information  °Narcotics Anonymous (NA), [facility] [address], °[place]  2 meetings at this location  ° °  Residential Treatment Programs Organization         Address  Phone  Notes  [facility] [address]    [place]  1-(504)088-1135   [facility]  [address], [place] [address], [place], [place]   [place], [place] [phone] Admissions: 8am-3pm M-F  [facility] [place] [address]    [place], [place] [phone]   [facility] [address] [place], [place], [place]   [facility] [address],  [place], [place]   [facility] Intensive Outpatient [address], [place], [place]   [facility] [address] [address]  [place], [place] 1-(450)389-8596 or 720-850-0213   [facility] [address] [place], [place] Accepts Medicaid  [facility] [address]  [place] 1-(516)002-1658 Substance Abuse/Addiction Treatment   [facility]         Address  Phone  Notes  [facility]  (347) 059-5411   Domenic Schwab, PhD [address] Arlis Porta [place], [place]   819-443-1209 or 4786712143   Luquillo Buckner [place], [place] 220-146-4710   [facility] [address], [place], [place] 425-856-6874 Insurance/Medicaid/sponsorship through [facility] Families [address], [address] [place]                                    [place], [place] [phone] [facility] [address], [place] [phone]    Dr. Adele Schilder  (367) [phone]   [facility] Dept. 1) [address] [place] 2) [place] 3)  [address], [place] ([phone] [phone]  ([phone]   [place] [phone] or [phone] (After Hours)       Take the prescriptions as directed.  Increase your fluid intake (ie:  Gatoraide) for the next few days, as discussed.  Eat a bland diet and advance to your regular diet slowly as you can tolerate it.   Avoid full strength juices, as well as milk and milk products until your diarrhea has resolved. Take over the counter decongestant (such as sudafed), as directed on packaging, for the next week.  Use over the counter normal saline nasal spray, as instructed in the Emergency Department, several times per day for the next 2 weeks. Take over the counter tylenol and ibuprofen, as directed on packaging, as needed for discomfort. Call your regular medical doctor today to schedule a follow up appointment this week.  Return to the Emergency Department immediately if not improving (or even worsening) despite taking the medicines as prescribed, any black or bloody stool or vomit, if you develop a fever over "101," or for any other concerns.

## 2013-09-16 NOTE — ED Notes (Signed)
Pt c/o n/v/d x 2 days and generalized body aches.

## 2013-09-16 NOTE — ED Provider Notes (Signed)
CSN: [id number]     Arrival date & time 09/16/13  0745 History   First MD Initiated Contact with Patient 09/16/13 [phone]     Chief Complaint  Patient presents with  . Emesis  . Diarrhea  . Cough     HPI Pt was seen at [facility]. Per pt, c/o gradual onset and persistence of multiple intermittent episodes of N/V/D for the past 2 days. Describes the stools as "watery." Has been associated with generalized abd "pain." Pt also c/o generalized body aches/fatigue, runny/stuffy nose, sinus congestion, "chills," cough, and sore throat. States others in household have similar URI symptoms. Denies recent antibiotics use. Denies CP/SOB, no back pain, no fevers, no black or blood in stools or emesis.     Past Medical History  Diagnosis Date  . Type 2 diabetes mellitus   . Essential hypertension, benign   . GERD (gastroesophageal reflux disease)   . COPD (chronic obstructive pulmonary disease)   . Arthritis   . Normal coronary arteries     3/10 - following abnormal Myoview  . Diabetes mellitus   . CHF (congestive heart failure)    Past Surgical History  Procedure Laterality Date  . Tubal ligation    . Cholecystectomy    . Cesarean section    . Abdominal hysterectomy  09/10/2011    Procedure: HYSTERECTOMY ABDOMINAL;  Surgeon: Jonnie Kind, MD;  Location: [facility];  Service: Gynecology;  Laterality: N/A;  Abdominal hysterectomy  . Scar revision  09/10/2011    Procedure: SCAR REVISION;  Surgeon: Jonnie Kind, MD;  Location: [facility];  Service: Gynecology;  Laterality: N/A;  Wide Excision of old Cicatrix   Family History  Problem Relation Age of Onset  . Cirrhosis Mother   . Diabetes type II Father   . Diabetes type II Sister   . Anesthesia problems Neg Hx   . Hypotension Neg Hx   . Malignant hyperthermia Neg Hx   . Pseudochol deficiency Neg Hx    History  Substance Use Topics  . Smoking status: Current Every Day Smoker -- 0.25 packs/day for 29 years    Types: Cigarettes  . Smokeless tobacco:  Never Used     Comment: 1 cigarette a day  . Alcohol Use: 0.0 oz/week     Comment: occ    Review of Systems ROS: Statement: All systems negative except as marked or noted in the HPI; Constitutional: Negative for fever and +chills, generalized body aches/fatigue. ; ; Eyes: Negative for eye pain, redness and discharge. ; ; ENMT: Negative for ear pain, hoarseness, +nasal congestion, sinus pressure and sore throat. ; ; Cardiovascular: Negative for chest pain, palpitations, diaphoresis, dyspnea and peripheral edema. ; ; Respiratory: +cough. Negative for wheezing and stridor. ; ; Gastrointestinal: +N/V/D, abd pain. Negative for blood in stool, hematemesis, jaundice and rectal bleeding. . ; ; Genitourinary: Negative for dysuria, flank pain and hematuria. ; ; Musculoskeletal: Negative for back pain and neck pain. Negative for swelling and trauma.; ; Skin: Negative for pruritus, rash, abrasions, blisters, bruising and skin lesion.; ; Neuro: Negative for headache, lightheadedness and neck stiffness. Negative for weakness, altered level of consciousness , altered mental status, extremity weakness, paresthesias, involuntary movement, seizure and syncope.     Allergies  Bee venom  Home Medications   Current Outpatient Rx  Name  Route  Sig  Dispense  Refill  . albuterol (PROVENTIL HFA;VENTOLIN HFA) 108 (90 BASE) MCG/ACT inhaler   Inhalation   Inhale 1-2 puffs into the lungs every  6 (six) hours as needed for wheezing.   1 Inhaler   0   . enalapril (VASOTEC) 20 MG tablet   Oral   Take 20 mg by mouth daily.         Marland Kitchen glimepiride (AMARYL) 4 MG tablet   Oral   Take 4 mg by mouth daily before breakfast.           . hydrochlorothiazide (HYDRODIURIL) 25 MG tablet   Oral   Take 25 mg by mouth daily.           Marland Kitchen HYDROcodone-acetaminophen (NORCO/VICODIN) 5-325 MG per tablet   Oral   Take 2 tablets by mouth every 4 (four) hours as needed.   10 tablet   0   . ibuprofen (ADVIL,MOTRIN) 200 MG  tablet   Oral   Take 400 mg by mouth every 6 (six) hours as needed for moderate pain.          Marland Kitchen ibuprofen (ADVIL,MOTRIN) 800 MG tablet   Oral   Take 1 tablet (800 mg total) by mouth 3 (three) times daily.   21 tablet   0   . metFORMIN (GLUCOPHAGE) 1000 MG tablet   Oral   Take 1,000 mg by mouth 2 (two) times daily with a meal.           . omeprazole (PRILOSEC) 20 MG capsule   Oral   Take 20 mg by mouth daily.          BP 137/88  Pulse 101  Temp(Src) 98.1 F (36.7 C) (Oral)  Resp 20  Ht 5\' 6"  (1.676 m)  Wt 213 lb (96.616 kg)  BMI 34.40 kg/m2  SpO2 100%  LMP 08/21/2011 Physical Exam 0810: Physical examination:  Nursing notes reviewed; Vital signs and O2 SAT reviewed;  Constitutional: Well developed, Well nourished, Well hydrated, In no acute distress; Head:  Normocephalic, atraumatic; Eyes: EOMI, PERRL, No scleral icterus; ENMT: TM's clear bilat. +edemetous nasal turbinates bilat with clear rhinorrhea. Mouth and pharynx without lesions. No tonsillar exudates. No intra-oral edema. No submandibular or sublingual edema. No hoarse voice, no drooling, no stridor. No pain with manipulation of larynx. Mouth and pharynx normal, Mucous membranes moist; Neck: Supple, Full range of motion, No lymphadenopathy; Cardiovascular: Tachycardic rate and rhythm, No gallop; Respiratory: Breath sounds coarse & equal bilaterally, No wheezes.  Speaking full sentences with ease, Normal respiratory effort/excursion; Chest: Nontender, Movement normal; Abdomen:  +diffusely tender to palp. No rebound or guarding. +softly distended, hyperactive bowel sounds; Genitourinary: No CVA tenderness; Extremities: Pulses normal, No tenderness, No edema, No calf edema or asymmetry.; Neuro: AA&Ox3, Major CN grossly intact.  Speech clear. No gross focal motor or sensory deficits in extremities. Climbs on and off stretcher easily by herself. Gait steady.; Skin: Color normal, Warm, Dry.   ED Course  Procedures   EKG  Interpretation   None       MDM  MDM Reviewed: previous chart, nursing note and vitals Reviewed previous: labs Interpretation: labs, CT scan and x-ray    Results for orders placed during the hospital encounter of 09/16/13  CLOSTRIDIUM DIFFICILE BY PCR      Result Value Ref Range   C difficile by pcr NEGATIVE  NEGATIVE  RAPID STREP SCREEN      Result Value Ref Range   Streptococcus, Group A Screen (Direct) NEGATIVE  NEGATIVE  CBC WITH DIFFERENTIAL      Result Value Ref Range   WBC 5.1  4.0 - 10.5 K/uL   RBC  5.46 (*) 3.87 - 5.11 MIL/uL   Hemoglobin 16.5 (*) 12.0 - 15.0 g/dL   HCT 47.8 (*) 36.0 - 46.0 %   MCV 87.5  78.0 - 100.0 fL   MCH 30.2  26.0 - 34.0 pg   MCHC 34.5  30.0 - 36.0 g/dL   RDW 12.9  11.5 - 15.5 %   Platelets 208  150 - 400 K/uL   Neutrophils Relative % 45  43 - 77 %   Neutro Abs 2.3  1.7 - 7.7 K/uL   Lymphocytes Relative 43  12 - 46 %   Lymphs Abs 2.2  0.7 - 4.0 K/uL   Monocytes Relative 11  3 - 12 %   Monocytes Absolute 0.6  0.1 - 1.0 K/uL   Eosinophils Relative 1  0 - 5 %   Eosinophils Absolute 0.1  0.0 - 0.7 K/uL   Basophils Relative 0  0 - 1 %   Basophils Absolute 0.0  0.0 - 0.1 K/uL  COMPREHENSIVE METABOLIC PANEL      Result Value Ref Range   Sodium 138  137 - 147 mEq/L   Potassium 3.8  3.7 - 5.3 mEq/L   Chloride 99  96 - 112 mEq/L   CO2 25  19 - 32 mEq/L   Glucose, Bld 266 (*) 70 - 99 mg/dL   BUN 11  6 - 23 mg/dL   Creatinine, Ser 0.67  0.50 - 1.10 mg/dL   Calcium 9.2  8.4 - 10.5 mg/dL   Total Protein 7.7  6.0 - 8.3 g/dL   Albumin 3.7  3.5 - 5.2 g/dL   AST 23  0 - 37 U/L   ALT 19  0 - 35 U/L   Alkaline Phosphatase 94  39 - 117 U/L   Total Bilirubin 0.4  0.3 - 1.2 mg/dL   GFR calc non Af Amer >90  >90 mL/min   GFR calc Af Amer >90  >90 mL/min  LIPASE, BLOOD      Result Value Ref Range   Lipase 21  11 - 59 U/L  URINALYSIS, ROUTINE W REFLEX MICROSCOPIC      Result Value Ref Range   Color, Urine YELLOW  YELLOW   APPearance CLEAR  CLEAR    Specific Gravity, Urine 1.010  1.005 - 1.030   pH 6.5  5.0 - 8.0   Glucose, UA 500 (*) NEGATIVE mg/dL   Hgb urine dipstick NEGATIVE  NEGATIVE   Bilirubin Urine NEGATIVE  NEGATIVE   Ketones, ur NEGATIVE  NEGATIVE mg/dL   Protein, ur TRACE (*) NEGATIVE mg/dL   Urobilinogen, UA 0.2  0.0 - 1.0 mg/dL   Nitrite NEGATIVE  NEGATIVE   Leukocytes, UA NEGATIVE  NEGATIVE  URINE MICROSCOPIC-ADD ON      Result Value Ref Range   Squamous Epithelial / LPF RARE  RARE   Dg Chest 2 View 09/16/2013   CLINICAL DATA:  Hypertension.  EXAM: CHEST  2 VIEW  COMPARISON:  August 18, 2013.  FINDINGS: Stable cardiomediastinal silhouette. No pleural effusion or pneumothorax is noted. No acute pulmonary disease is noted. Bony thorax is intact.  IMPRESSION: No active cardiopulmonary disease.   Electronically Signed   By: Sabino Dick M.D.   On: 09/16/2013 09:42   Ct Abdomen Pelvis W Contrast 09/16/2013   CLINICAL DATA:  Abdominal pain, nausea, vomiting, diarrhea, history hypertension, CHF, diabetes, COPD  EXAM: CT ABDOMEN AND PELVIS WITH CONTRAST  TECHNIQUE: Multidetector CT imaging of the abdomen and pelvis was performed using  the standard protocol following bolus administration of intravenous contrast. Sagittal and coronal MPR images reconstructed from axial data set.  CONTRAST:  18mL OMNIPAQUE IOHEXOL 300 MG/ML SOLN ORALLY, 132mL OMNIPAQUE IOHEXOL 300 MG/ML SOLN IV  COMPARISON:  02/20/2005  FINDINGS: Lung bases clear.  Gallbladder surgically absent.  Fatty infiltration of liver.  Liver, spleen, pancreas, kidneys, and adrenal glands otherwise normal.  Normal appendix.  Few normal-sized lymph nodes at gastrosplenic ligament and gastrohepatic ligament.  Stomach and bowel loops unremarkable.  Uterus surgically absent with normal sized ovaries.  Unremarkable bladder and ureters.  No mass, adenopathy, free fluid or inflammatory process.  No hernia or acute bone lesion.  IMPRESSION: Fatty infiltration of liver.  No acute  intra-abdominal or intrapelvic abnormalities.   Electronically Signed   By: Lavonia Dana M.D.   On: 09/16/2013 09:45    1225:  Pt has tol PO well while in the ED without N/V.  Has stooled several times while in the ED; cdiff negative, GI pathogen panel pending. Pt now recalls she has been "drinking a lot of juice" for the past 2 days. Diarrhea diet reviewed. Abd benign, VSS. Has been ambulatory with steady upright gait, easy resps, NAD. Feels better and wants to go home now. Will tx symptomatically at this time. Dx and testing d/w pt and family.  Questions answered.  Verb understanding, agreeable to d/c home with outpt f/u.     Alfonzo Feller, DO 09/18/13 1227

## 2013-09-17 LAB — URINE CULTURE: Colony Count: 8000

## 2013-09-18 LAB — CULTURE, GROUP A STREP

## 2013-09-19 LAB — GI PATHOGEN PANEL BY PCR, STOOL
C difficile toxin A/B: NEGATIVE
Campylobacter by PCR: NEGATIVE
Cryptosporidium by PCR: NEGATIVE
E coli (ETEC) LT/ST: NEGATIVE
E coli (STEC): NEGATIVE
E coli 0157 by PCR: NEGATIVE
G lamblia by PCR: NEGATIVE
Norovirus GI/GII: NEGATIVE
Rotavirus A by PCR: POSITIVE
Salmonella by PCR: NEGATIVE
Shigella by PCR: NEGATIVE

## 2013-10-12 ENCOUNTER — Emergency Department (HOSPITAL_COMMUNITY): Payer: Self-pay

## 2013-10-12 ENCOUNTER — Emergency Department (HOSPITAL_COMMUNITY)
Admission: EM | Admit: 2013-10-12 | Discharge: 2013-10-12 | Disposition: A | Discharge status: Home / Self Care | Payer: Self-pay | Attending: Emergency Medicine | Admitting: Emergency Medicine

## 2013-10-12 ENCOUNTER — Encounter (HOSPITAL_COMMUNITY): Payer: Self-pay | Admitting: Emergency Medicine

## 2013-10-12 DIAGNOSIS — N83209 Unspecified ovarian cyst, unspecified side: Secondary | ICD-10-CM | POA: Insufficient documentation

## 2013-10-12 DIAGNOSIS — Z7982 Long term (current) use of aspirin: Secondary | ICD-10-CM | POA: Insufficient documentation

## 2013-10-12 DIAGNOSIS — R197 Diarrhea, unspecified: Secondary | ICD-10-CM | POA: Insufficient documentation

## 2013-10-12 DIAGNOSIS — I10 Essential (primary) hypertension: Secondary | ICD-10-CM | POA: Insufficient documentation

## 2013-10-12 DIAGNOSIS — K219 Gastro-esophageal reflux disease without esophagitis: Secondary | ICD-10-CM | POA: Insufficient documentation

## 2013-10-12 DIAGNOSIS — J441 Chronic obstructive pulmonary disease with (acute) exacerbation: Secondary | ICD-10-CM | POA: Insufficient documentation

## 2013-10-12 DIAGNOSIS — F17200 Nicotine dependence, unspecified, uncomplicated: Secondary | ICD-10-CM | POA: Insufficient documentation

## 2013-10-12 DIAGNOSIS — Z79899 Other long term (current) drug therapy: Secondary | ICD-10-CM | POA: Insufficient documentation

## 2013-10-12 DIAGNOSIS — E119 Type 2 diabetes mellitus without complications: Secondary | ICD-10-CM | POA: Insufficient documentation

## 2013-10-12 DIAGNOSIS — N83201 Unspecified ovarian cyst, right side: Secondary | ICD-10-CM

## 2013-10-12 DIAGNOSIS — Z9089 Acquired absence of other organs: Secondary | ICD-10-CM | POA: Insufficient documentation

## 2013-10-12 DIAGNOSIS — Z90710 Acquired absence of both cervix and uterus: Secondary | ICD-10-CM | POA: Insufficient documentation

## 2013-10-12 DIAGNOSIS — I509 Heart failure, unspecified: Secondary | ICD-10-CM | POA: Insufficient documentation

## 2013-10-12 DIAGNOSIS — R079 Chest pain, unspecified: Secondary | ICD-10-CM | POA: Insufficient documentation

## 2013-10-12 DIAGNOSIS — M129 Arthropathy, unspecified: Secondary | ICD-10-CM | POA: Insufficient documentation

## 2013-10-12 DIAGNOSIS — Z9889 Other specified postprocedural states: Secondary | ICD-10-CM | POA: Insufficient documentation

## 2013-10-12 LAB — CBC WITH DIFFERENTIAL/PLATELET
Basophils Absolute: 0 K/uL (ref 0.0–0.1)
Basophils Relative: 1 % (ref 0–1)
Eosinophils Absolute: 0.2 K/uL (ref 0.0–0.7)
Eosinophils Relative: 3 % (ref 0–5)
HCT: 42 % (ref 36.0–46.0)
Hemoglobin: 14.4 g/dL (ref 12.0–15.0)
Lymphocytes Relative: 46 % (ref 12–46)
Lymphs Abs: 3 K/uL (ref 0.7–4.0)
MCH: 30.2 pg (ref 26.0–34.0)
MCHC: 34.3 g/dL (ref 30.0–36.0)
MCV: 88.1 fL (ref 78.0–100.0)
Monocytes Absolute: 0.5 K/uL (ref 0.1–1.0)
Monocytes Relative: 8 % (ref 3–12)
Neutro Abs: 2.6 K/uL (ref 1.7–7.7)
Neutrophils Relative %: 42 % — ABNORMAL LOW (ref 43–77)
Platelets: 216 K/uL (ref 150–400)
RBC: 4.77 MIL/uL (ref 3.87–5.11)
RDW: 13.1 % (ref 11.5–15.5)
WBC: 6.4 K/uL (ref 4.0–10.5)

## 2013-10-12 LAB — COMPREHENSIVE METABOLIC PANEL WITH GFR
ALT: 11 U/L (ref 0–35)
AST: 14 U/L (ref 0–37)
Albumin: 3.4 g/dL — ABNORMAL LOW (ref 3.5–5.2)
Alkaline Phosphatase: 109 U/L (ref 39–117)
BUN: 10 mg/dL (ref 6–23)
CO2: 24 meq/L (ref 19–32)
Calcium: 9.1 mg/dL (ref 8.4–10.5)
Chloride: 101 meq/L (ref 96–112)
Creatinine, Ser: 0.66 mg/dL (ref 0.50–1.10)
GFR calc Af Amer: >90 mL/min
GFR calc non Af Amer: >90 mL/min
Glucose, Bld: 334 mg/dL — ABNORMAL HIGH (ref 70–99)
Potassium: 4.2 meq/L (ref 3.7–5.3)
Sodium: 138 meq/L (ref 137–147)
Total Bilirubin: 0.2 mg/dL — ABNORMAL LOW (ref 0.3–1.2)
Total Protein: 6.9 g/dL (ref 6.0–8.3)

## 2013-10-12 LAB — URINALYSIS, ROUTINE W REFLEX MICROSCOPIC
Bilirubin Urine: NEGATIVE
Glucose, UA: >1000 mg/dL — AB
Ketones, ur: NEGATIVE mg/dL
Leukocytes, UA: NEGATIVE
Nitrite: NEGATIVE
Protein, ur: NEGATIVE mg/dL
Specific Gravity, Urine: 1.02 (ref 1.005–1.030)
Urobilinogen, UA: 1 mg/dL (ref 0.0–1.0)
pH: 6 (ref 5.0–8.0)

## 2013-10-12 LAB — TROPONIN I: Troponin I: <0.3 ng/mL (ref <0.30)

## 2013-10-12 LAB — URINE MICROSCOPIC-ADD ON

## 2013-10-12 IMAGING — CR DG ABDOMEN ACUTE W/ 1V CHEST
3 series · 3 of 3 positions shown · non-contrast
Comparison: none

[view not recorded (1 of 3)]
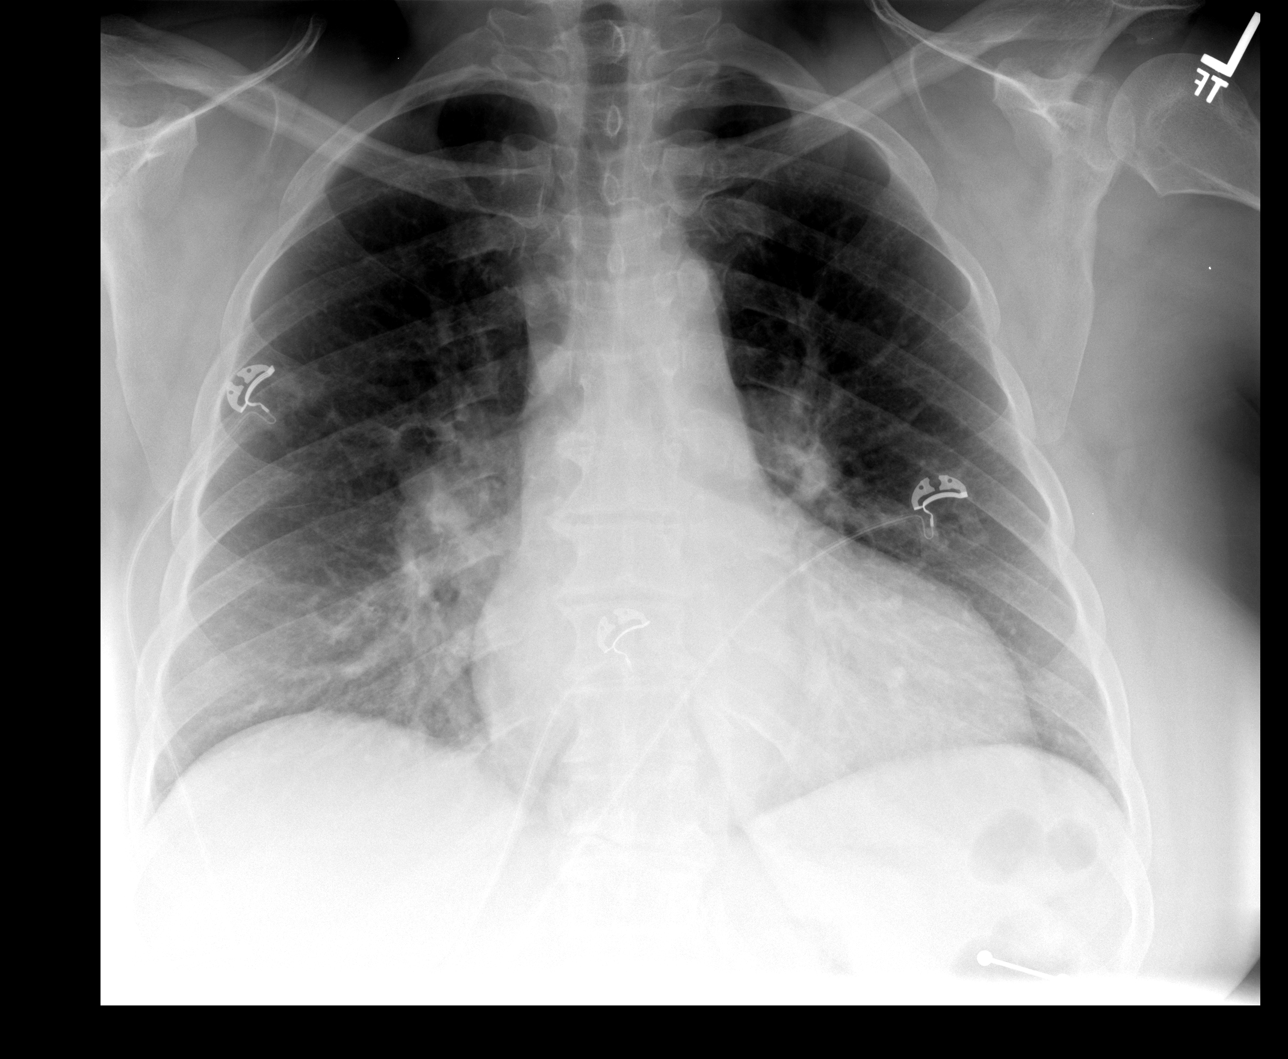

[view not recorded (2 of 3)]
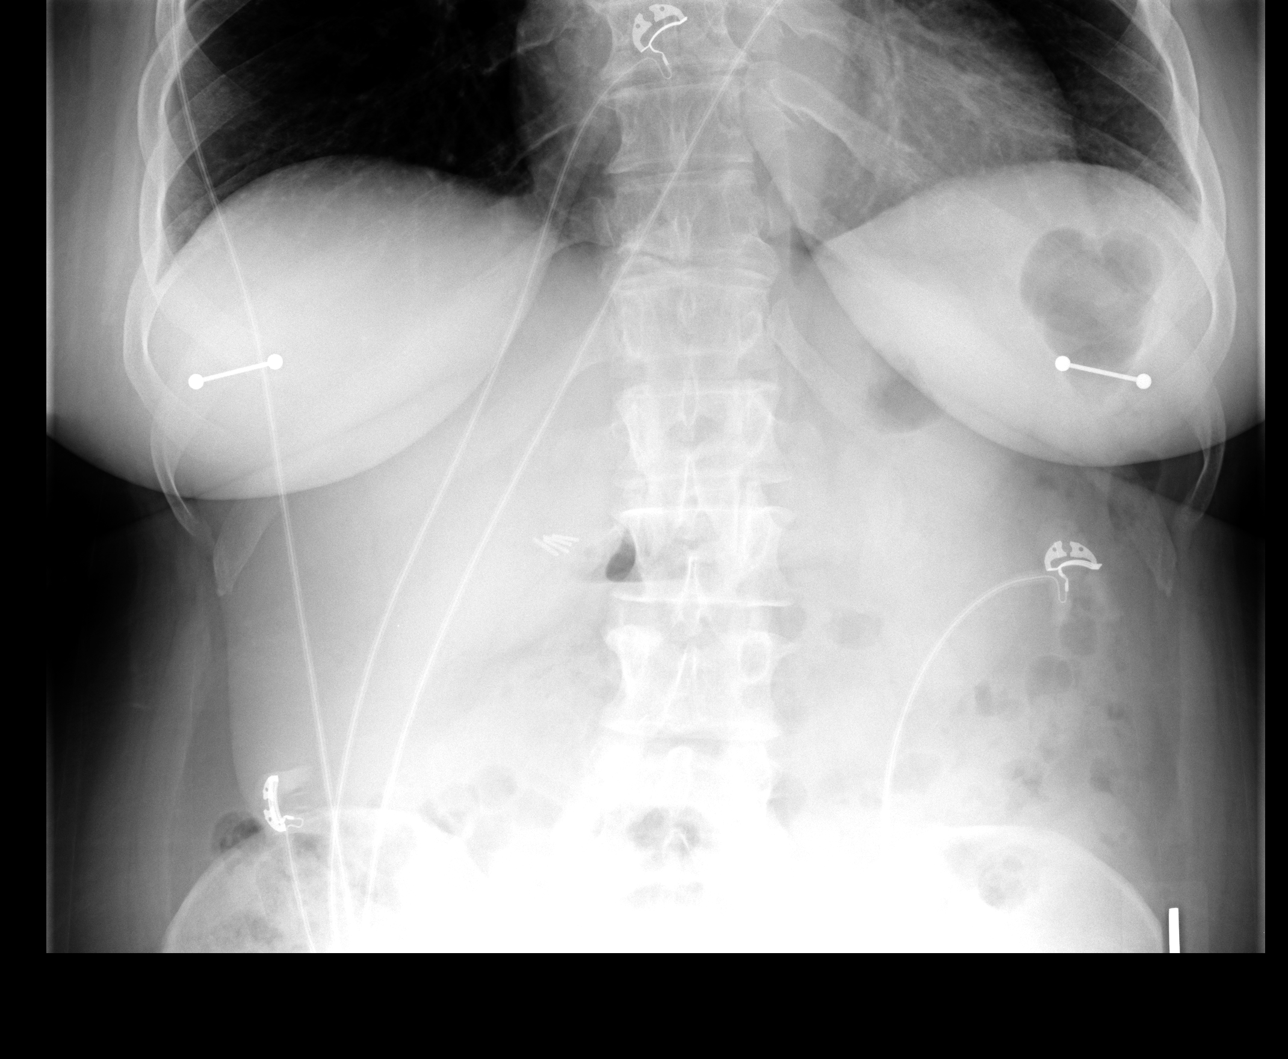

[view not recorded (3 of 3)]
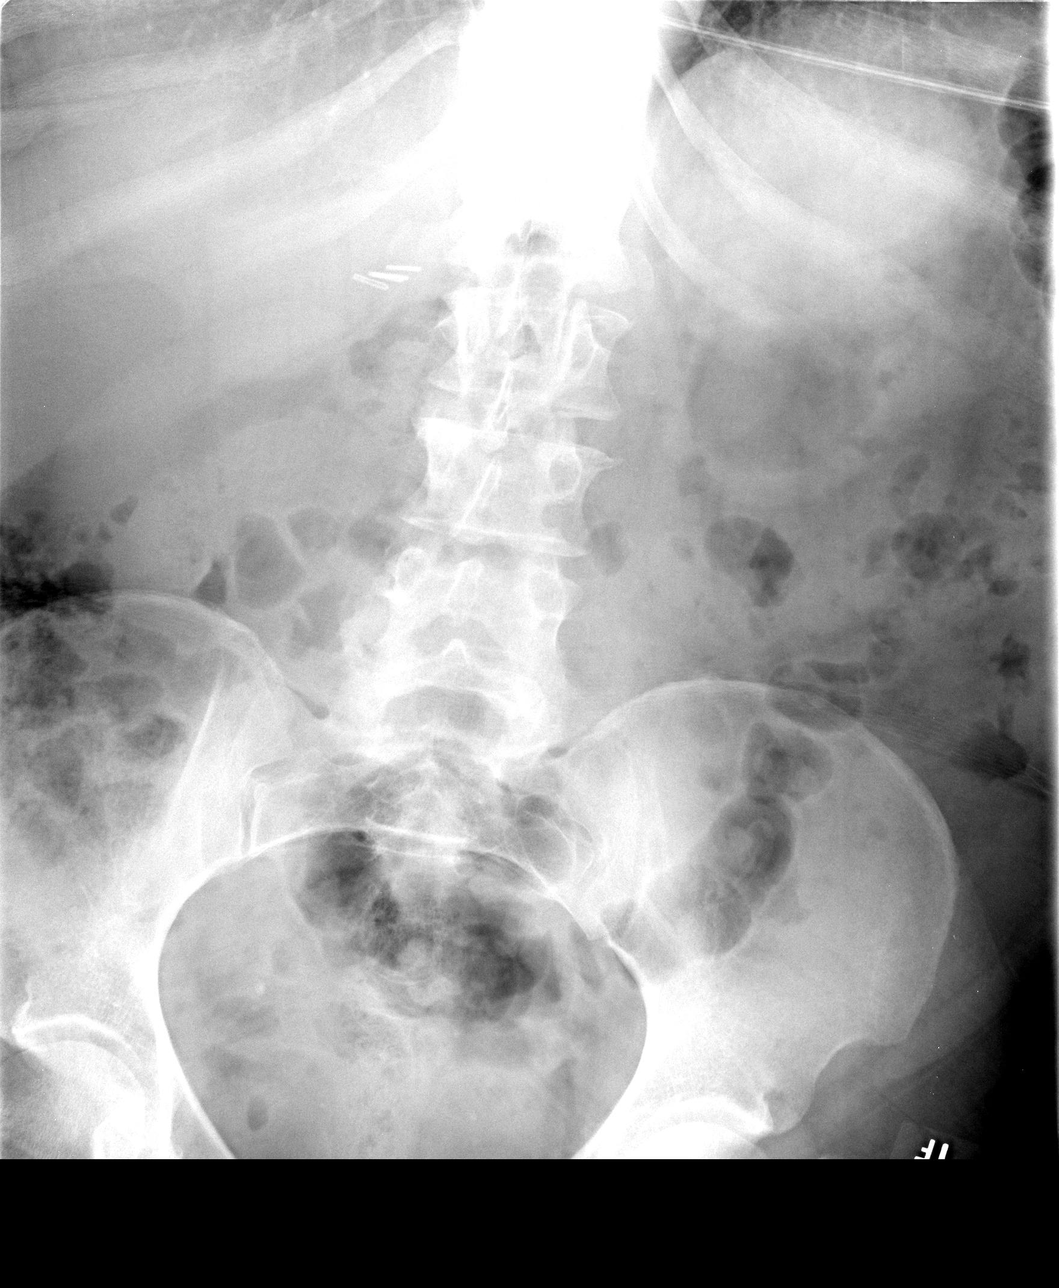

[3 of 3 positions shown; findings below may reference images not displayed]

CLINICAL DATA
Right lower quadrant pain, painful urination

EXAM
ACUTE ABDOMEN SERIES (ABDOMEN 2 VIEW & CHEST 1 VIEW)

COMPARISON
DG CHEST 2 VIEW dated [DATE]; CT ANGIO CHEST AORTA W/CM & WO/CM
dated [DATE]; DG CHEST 2 VIEW dated [DATE]; DG CHEST 2 VIEW
dated [DATE]

FINDINGS
No focal consolidation, pleural effusion or pneumothorax. Mild
bilateral interstitial prominence. . Stable cardiomegaly. The
osseous structures are unremarkable.

There is no bowel dilatation to suggest obstruction. There are no
air-fluid levels. There is no evidence of pneumoperitoneum, portal
venous gas, or pneumatosis.

Osseous structures are unremarkable.

IMPRESSION
Negative abdominal radiographs.  No acute cardiopulmonary disease.

SIGNATURE

## 2013-10-12 IMAGING — CT CT ABD-PELV W/ CM
2 of 5 series · 17 of 46 positions shown, 19 images · IV contrast (Omnipaque 300)
Comparison: none

[Series 2: abd_pel_with 5.0 b40f · axial · 0.81mm/px · z∈[-403,-23]mm · 14 of 86 slices shown, 16 images]
[im 5/86  soft-tissue]
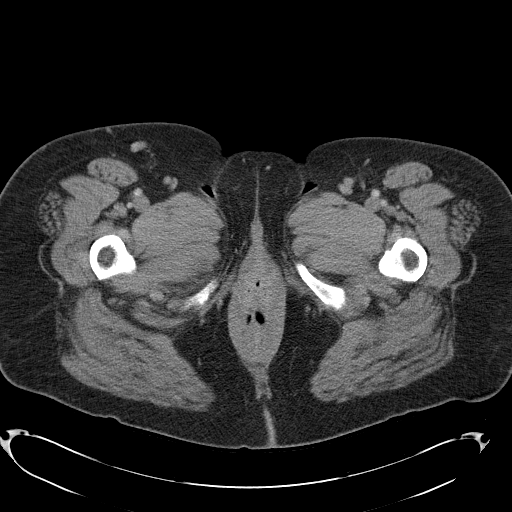
[im 5/86  bone]
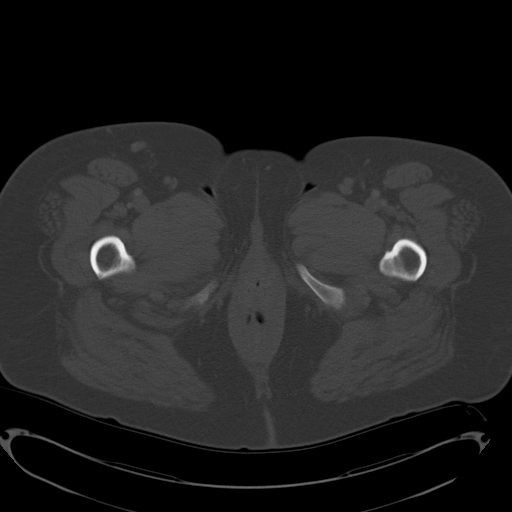
[im 9/86  soft-tissue]
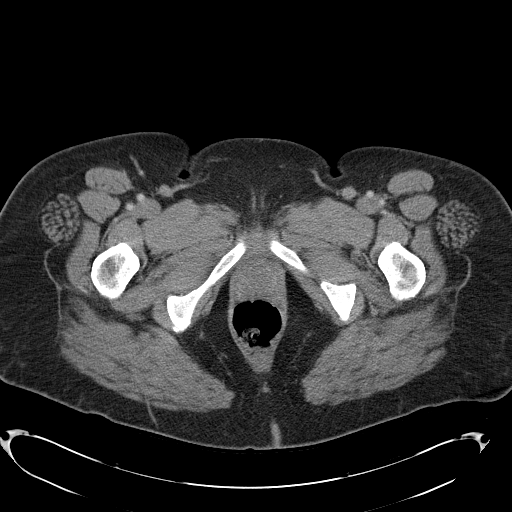
[im 18/86  soft-tissue]
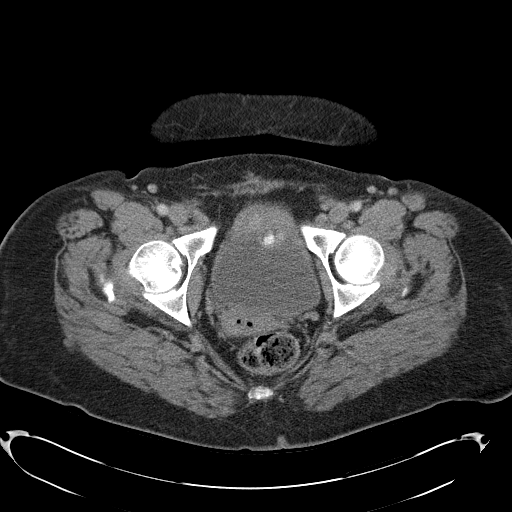
[im 23/86  soft-tissue]
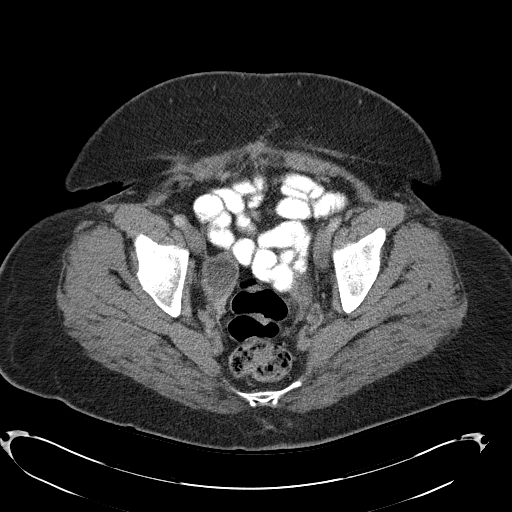
[im 27/86  soft-tissue]
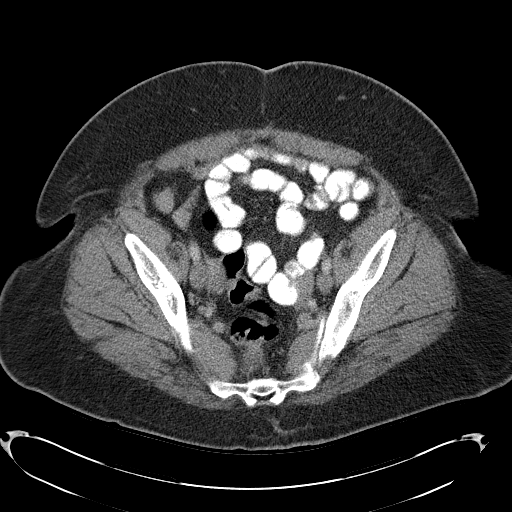
[im 36/86  soft-tissue]
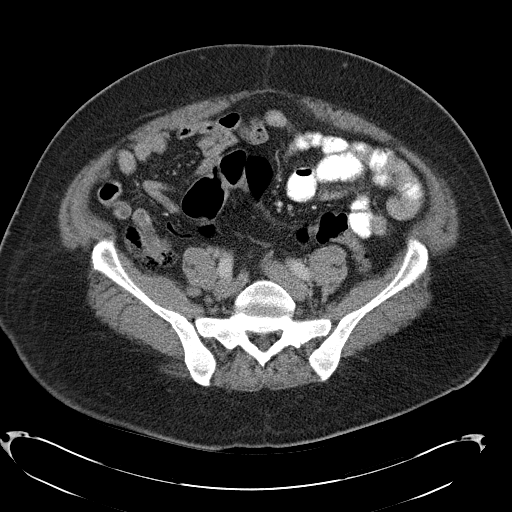
[im 41/86  soft-tissue]
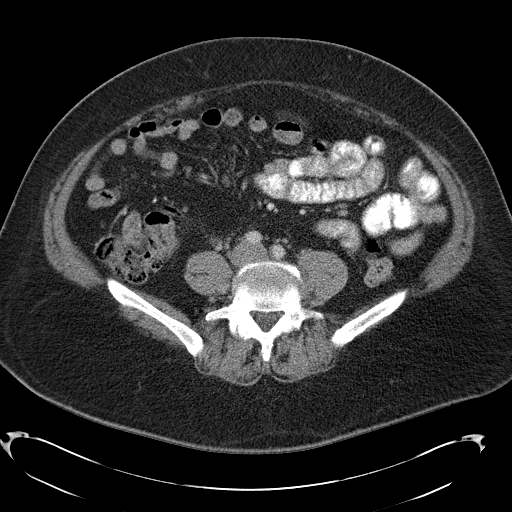
[im 45/86  soft-tissue]
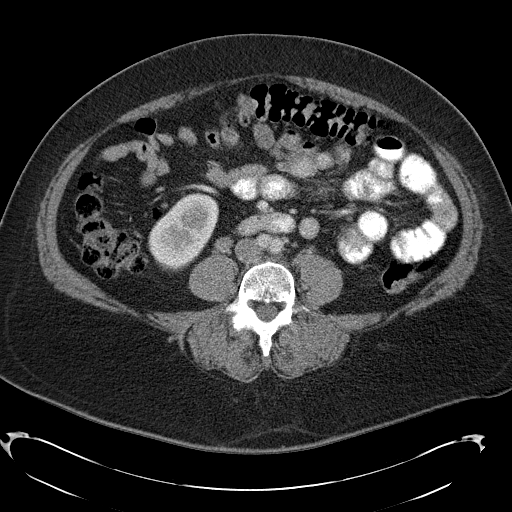
[im 50/86  soft-tissue]
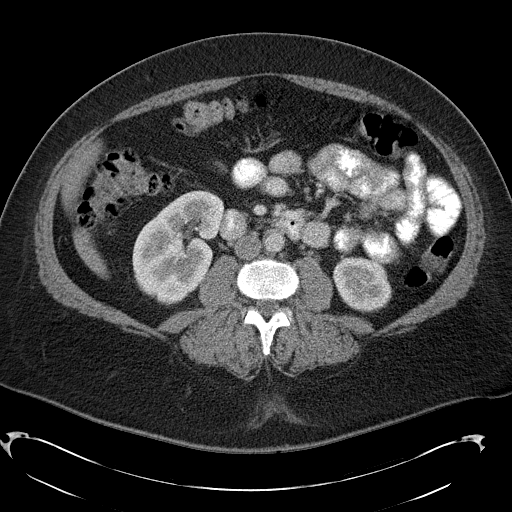
[im 50/86  bone]
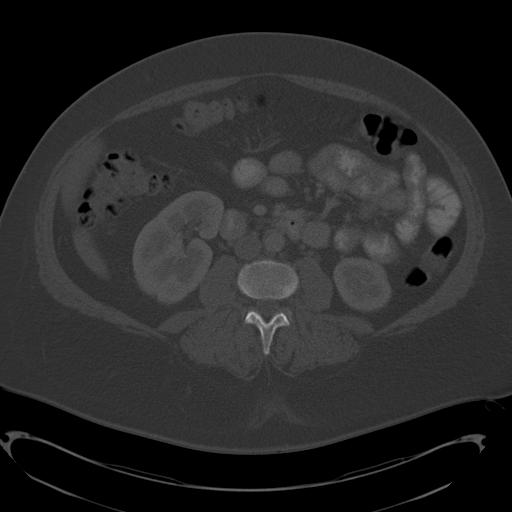
[im 59/86  soft-tissue]
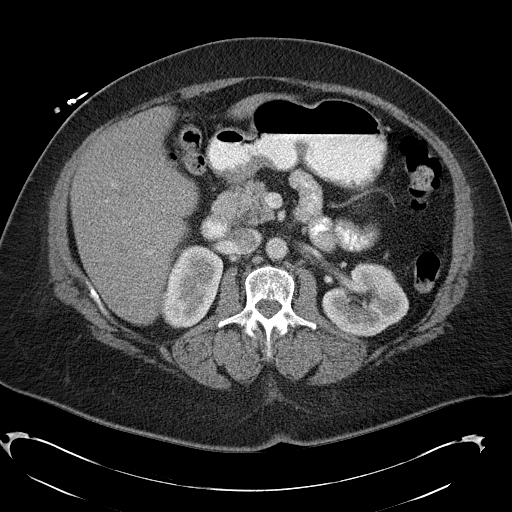
[im 63/86  soft-tissue]
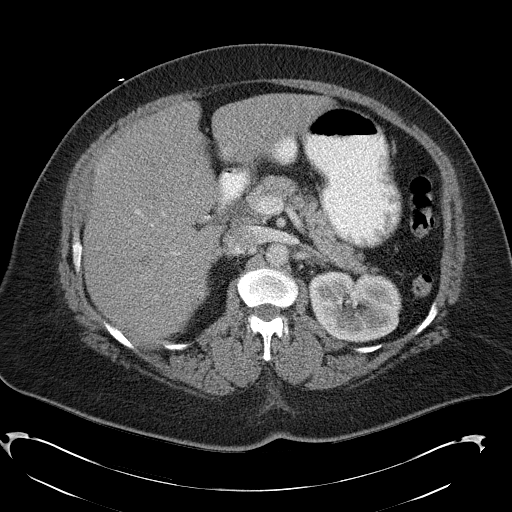
[im 68/86  soft-tissue]
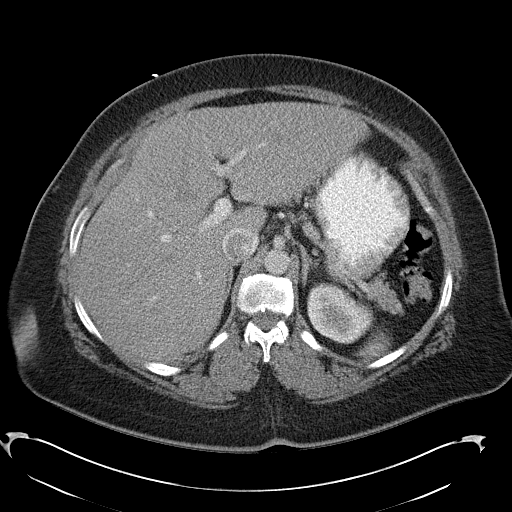
[im 77/86  soft-tissue]
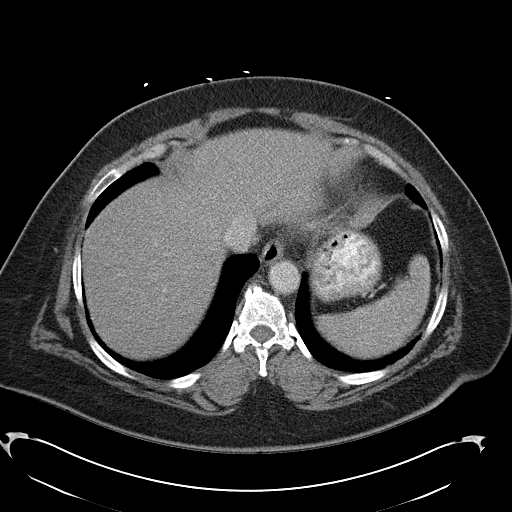
[im 81/86  soft-tissue]
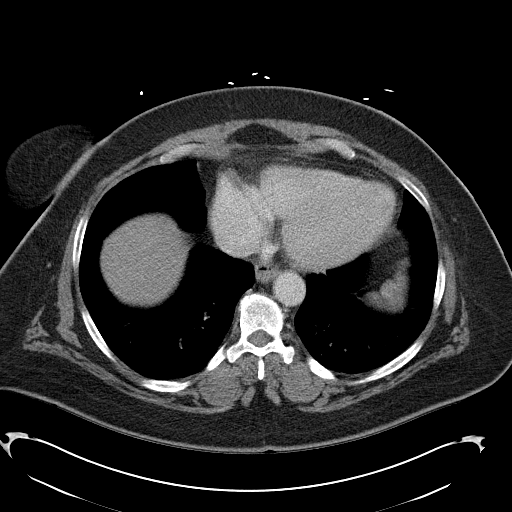

[Series 4: abd_pel_with 3.0 spo cor · coronal · 0.82mm/px · 3 of 110 slices shown]
[im 37/110  soft-tissue]
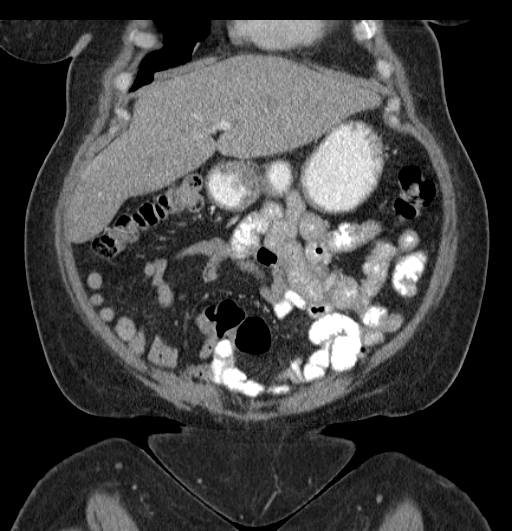
[im 49/110  soft-tissue]
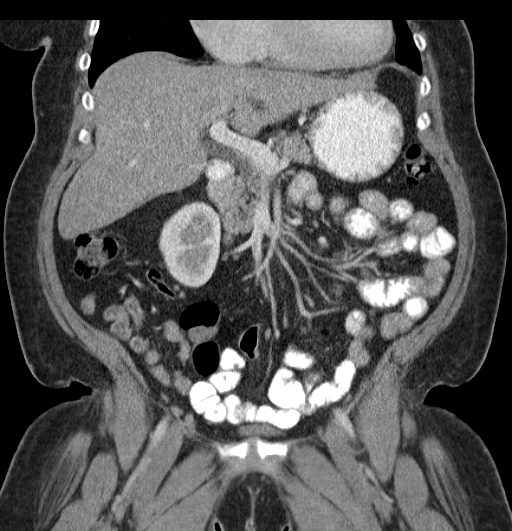
[im 61/110  soft-tissue]
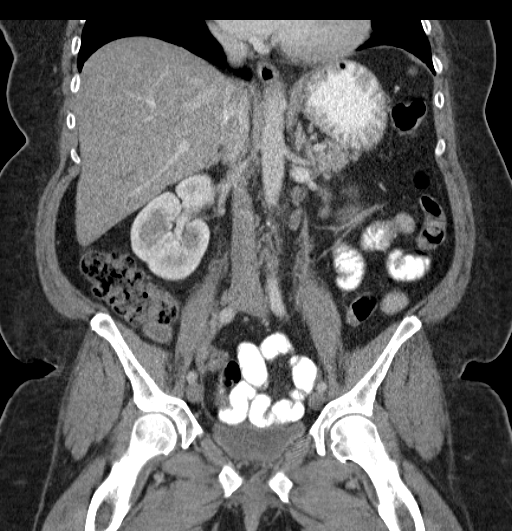

[17 of 46 positions shown; findings below may reference images not displayed]

CLINICAL DATA
Abdominal pain.

EXAM
CT ABDOMEN AND PELVIS WITH CONTRAST

TECHNIQUE
Multidetector CT imaging of the abdomen and pelvis was performed
using the standard protocol following bolus administration of
intravenous contrast.

CONTRAST
50mL OMNIPAQUE IOHEXOL 300 MG/ML SOLN, 100mL OMNIPAQUE IOHEXOL 300
MG/ML SOLN

COMPARISON
CT scan of [DATE].

FINDINGS
Visualized lung bases appear normal. No osseous abnormality is
noted.

The liver, spleen and pancreas appear normal. Status post
cholecystectomy. Adrenal glands appear normal. The kidneys appear
normal. No hydronephrosis or renal obstruction is noted. The
appendix appears normal. There is no evidence of bowel obstruction.
Urinary bladder appears normal. 2.9 cm right ovarian cyst is noted.
No abnormal fluid collection is noted. No significant adenopathy is
noted.

IMPRESSION
Status post cholecystectomy.

2.9 cm right ovarian cyst is noted which is most likely physiologic.

No hydronephrosis or renal obstruction is identified.

No other significant abnormality seen in the abdomen or pelvis.

SIGNATURE

## 2013-10-12 MED ORDER — MORPHINE SULFATE 4 MG/ML IJ SOLN
4.0000 mg | Freq: Once | INTRAMUSCULAR | Status: AC
  Administered 2013-10-12: 4 mg via INTRAVENOUS
  Filled 2013-10-12: qty 1

## 2013-10-12 MED ORDER — KETOROLAC TROMETHAMINE 30 MG/ML IJ SOLN
30.0000 mg | Freq: Once | INTRAMUSCULAR | Status: AC
  Administered 2013-10-12: 30 mg via INTRAVENOUS
  Filled 2013-10-12: qty 1

## 2013-10-12 MED ORDER — IOHEXOL 300 MG/ML  SOLN
100.0000 mL | Freq: Once | INTRAMUSCULAR | Status: AC | PRN
  Administered 2013-10-12: 100 mL via INTRAVENOUS

## 2013-10-12 MED ORDER — NAPROXEN 500 MG PO TABS: 500.0000 mg | ORAL_TABLET | Freq: Two times a day (BID) | ORAL | Status: DC

## 2013-10-12 MED ORDER — HYDROCODONE-ACETAMINOPHEN 5-325 MG PO TABS: 1.0000 | ORAL_TABLET | ORAL | Status: DC | PRN

## 2013-10-12 MED ORDER — ONDANSETRON HCL 4 MG/2ML IJ SOLN
4.0000 mg | Freq: Once | INTRAMUSCULAR | Status: AC
  Administered 2013-10-12: 4 mg via INTRAVENOUS
  Filled 2013-10-12: qty 2

## 2013-10-12 MED ORDER — IOHEXOL 300 MG/ML  SOLN
50.0000 mL | Freq: Once | INTRAMUSCULAR | Status: AC | PRN
  Administered 2013-10-12: 50 mL via ORAL

## 2013-10-12 MED ORDER — SODIUM CHLORIDE 0.9 % IV BOLUS (SEPSIS)
500.0000 mL | Freq: Once | INTRAVENOUS | Status: AC
  Administered 2013-10-12: 500 mL via INTRAVENOUS

## 2013-10-12 NOTE — ED Notes (Signed)
Pt is finished drinking ct contrast at this time.

## 2013-10-12 NOTE — Discharge Instructions (Signed)
Ovarian Cyst °An ovarian cyst is a fluid-filled sac that forms on an ovary. The ovaries are small organs that produce eggs in women. Various types of cysts can form on the ovaries. Most are not cancerous. Many do not cause problems, and they often go away on their own. Some may cause symptoms and require treatment. Common types of ovarian cysts include: °· Functional cysts These cysts may occur every month during the menstrual cycle. This is normal. The cysts usually go away with the next menstrual cycle if the woman does not get pregnant. Usually, there are no symptoms with a functional cyst. °· Endometrioma cysts These cysts form from the tissue that lines the uterus. They are also called "chocolate cysts" because they become filled with blood that turns brown. This type of cyst can cause pain in the lower abdomen during intercourse and with your menstrual period. °· Cystadenoma cysts This type develops from the cells on the outside of the ovary. These cysts can get very big and cause lower abdomen pain and pain with intercourse. This type of cyst can twist on itself, cut off its blood supply, and cause severe pain. It can also easily rupture and cause a lot of pain. °· Dermoid cysts This type of cyst is sometimes found in both ovaries. These cysts may contain different kinds of body tissue, such as skin, teeth, hair, or cartilage. They usually do not cause symptoms unless they get very big. °· Theca lutein cysts These cysts occur when too much of a certain hormone (human chorionic gonadotropin) is produced and overstimulates the ovaries to produce an egg. This is most common after procedures used to assist with the conception of a baby (in vitro fertilization). °CAUSES  °· Fertility drugs can cause a condition in which multiple large cysts are formed on the ovaries. This is called ovarian hyperstimulation syndrome. °· A condition called polycystic ovary syndrome can cause hormonal imbalances that can lead to  nonfunctional ovarian cysts. °SIGNS AND SYMPTOMS  °Many ovarian cysts do not cause symptoms. If symptoms are present, they may include: °· Pelvic pain or pressure. °· Pain in the lower abdomen. °· Pain during sexual intercourse. °· Increasing girth (swelling) of the abdomen. °· Abnormal menstrual periods. °· Increasing pain with menstrual periods. °· Stopping having menstrual periods without being pregnant. °DIAGNOSIS  °These cysts are commonly found during a routine or annual pelvic exam. Tests may be ordered to find out more about the cyst. These tests may include: °· Ultrasound. °· X-ray of the pelvis. °· CT scan. °· MRI. °· Blood tests. °TREATMENT  °Many ovarian cysts go away on their own without treatment. Your health care provider may want to check your cyst regularly for 2 3 months to see if it changes. For women in menopause, it is particularly important to monitor a cyst closely because of the higher rate of ovarian cancer in menopausal women. When treatment is needed, it may include any of the following: °· A procedure to drain the cyst (aspiration). This may be done using a long needle and ultrasound. It can also be done through a laparoscopic procedure. This involves using a thin, lighted tube with a tiny camera on the end (laparoscope) inserted through a small incision. °· Surgery to remove the whole cyst. This may be done using laparoscopic surgery or an open surgery involving a larger incision in the lower abdomen. °· Hormone treatment or birth control pills. These methods are sometimes used to help dissolve a cyst. °HOME CARE   INSTRUCTIONS  °· Only take over-the-counter or prescription medicines as directed by your health care provider. °· Follow up with your health care provider as directed. °· Get regular pelvic exams and Pap tests. °SEEK MEDICAL CARE IF:  °· Your periods are late, irregular, or painful, or they stop. °· Your pelvic pain or abdominal pain does not go away. °· Your abdomen becomes  larger or swollen. °· You have pressure on your bladder or trouble emptying your bladder completely. °· You have pain during sexual intercourse. °· You have feelings of fullness, pressure, or discomfort in your stomach. °· You lose weight for no apparent reason. °· You feel generally ill. °· You become constipated. °· You lose your appetite. °· You develop acne. °· You have an increase in body and facial hair. °· You are gaining weight, without changing your exercise and eating habits. °· You think you are pregnant. °SEEK IMMEDIATE MEDICAL CARE IF:  °· You have increasing abdominal pain. °· You feel sick to your stomach (nauseous), and you throw up (vomit). °· You develop a fever that comes on suddenly. °· You have abdominal pain during a bowel movement. °· Your menstrual periods become heavier than usual. °Document Released: 07/22/2005 Document Revised: 05/12/2013 Document Reviewed: 03/29/2013 °[employer]® Patient Information ©2014 [employer]. ° °

## 2013-10-12 NOTE — ED Provider Notes (Signed)
CSN: [id number]     Arrival date & time 10/12/13  1806 History   First MD Initiated Contact with Patient 10/12/13 1822     Chief Complaint  Patient presents with  . Abdominal Pain     (Consider location/radiation/quality/duration/timing/severity/associated sxs/prior Treatment) HPI Comments: Robin [name] is a 48 y.o. Female presenting with cramping right lower abdominal pain which started this morning and is waxing and waning in character but persistent.  She also reports abdominal distention and diarrhea which occurred yesterday morning,  Several occurences of non bloody watery stool, then resolution without intervention.  She denies fevers, chills, nausea, vomiting or shortness of breath.  She has a known midline upper hernia.  She states her pain reminds her of pain she experienced prior to having her hysterectomy at which time she also had a lot of "scar tissue" cut out from prior c sections.  To her knowledge she still has her ovaries.  She also reports a 30 minute episode of sharp chest pain this am which occurred while walking and went away with rest.  She did have shortness of breath with the chest pain and resolved with rest.  It has been at least 8 hours since this symptom occurred.  She denies headache, dizziness and diaphoresis with this episode.  She denies history of coronary disease and had a normal cardiac catheterization in 2010.     Patient is a 48 y.o. female presenting with abdominal pain. The history is provided by the patient.  Abdominal Pain Associated symptoms: chest pain, diarrhea and shortness of breath   Associated symptoms: no fever, no nausea, no sore throat and no vomiting     Past Medical History  Diagnosis Date  . Type 2 diabetes mellitus   . Essential hypertension, benign   . GERD (gastroesophageal reflux disease)   . COPD (chronic obstructive pulmonary disease)   . Arthritis   . Normal coronary arteries     3/10 - following abnormal Myoview  .  Diabetes mellitus   . CHF (congestive heart failure)    Past Surgical History  Procedure Laterality Date  . Tubal ligation    . Cholecystectomy    . Cesarean section    . Abdominal hysterectomy  09/10/2011    Procedure: HYSTERECTOMY ABDOMINAL;  Surgeon: Jonnie Kind, MD;  Location: [facility];  Service: Gynecology;  Laterality: N/A;  Abdominal hysterectomy  . Scar revision  09/10/2011    Procedure: SCAR REVISION;  Surgeon: Jonnie Kind, MD;  Location: [facility];  Service: Gynecology;  Laterality: N/A;  Wide Excision of old Cicatrix   Family History  Problem Relation Age of Onset  . Cirrhosis Mother   . Diabetes type II Father   . Diabetes type II Sister   . Anesthesia problems Neg Hx   . Hypotension Neg Hx   . Malignant hyperthermia Neg Hx   . Pseudochol deficiency Neg Hx    History  Substance Use Topics  . Smoking status: Current Every Day Smoker -- 0.25 packs/day for 29 years    Types: Cigarettes  . Smokeless tobacco: Never Used     Comment: 1 cigarette a day  . Alcohol Use: 0.0 oz/week     Comment: occ   OB History   Grav Para Term Preterm Abortions TAB SAB Ect Mult Living                 Review of Systems  Constitutional: Negative for fever.  HENT: Negative for congestion and sore throat.  Eyes: Negative.   Respiratory: Positive for shortness of breath. Negative for chest tightness.   Cardiovascular: Positive for chest pain.  Gastrointestinal: Positive for abdominal pain, diarrhea and abdominal distention. Negative for nausea, vomiting and blood in stool.  Genitourinary: Negative.   Musculoskeletal: Negative for arthralgias, joint swelling and neck pain.  Skin: Negative.  Negative for rash and wound.  Neurological: Negative for dizziness, weakness, light-headedness, numbness and headaches.  Psychiatric/Behavioral: Negative.       Allergies  Bee venom  Home Medications   Current Outpatient Rx  Name  Route  Sig  Dispense  Refill  . aspirin EC 81 MG tablet    Oral   Take 162 mg by mouth once as needed.         . Aspirin Effervescent (ALKA-SELTZER PO)   Oral   Take 2 tablets by mouth daily as needed (stomach pain).         . enalapril (VASOTEC) 20 MG tablet   Oral   Take 20 mg by mouth every morning.          Marland Kitchen esomeprazole (NEXIUM) 20 MG capsule   Oral   Take 20 mg by mouth daily before breakfast.         . glimepiride (AMARYL) 4 MG tablet   Oral   Take 4 mg by mouth daily before breakfast.           . hydrochlorothiazide (HYDRODIURIL) 25 MG tablet   Oral   Take 25 mg by mouth every morning.          Marland Kitchen ibuprofen (ADVIL,MOTRIN) 200 MG tablet   Oral   Take 400-800 mg by mouth every 6 (six) hours as needed for moderate pain.          . metFORMIN (GLUCOPHAGE) 1000 MG tablet   Oral   Take 1,000 mg by mouth 2 (two) times daily with a meal.           . albuterol (PROVENTIL HFA;VENTOLIN HFA) 108 (90 BASE) MCG/ACT inhaler   Inhalation   Inhale 1-2 puffs into the lungs every 6 (six) hours as needed for wheezing.   1 Inhaler   0   . HYDROcodone-acetaminophen (NORCO/VICODIN) 5-325 MG per tablet   Oral   Take 1 tablet by mouth every 4 (four) hours as needed for moderate pain.   15 tablet   0   . naproxen (NAPROSYN) 500 MG tablet   Oral   Take 1 tablet (500 mg total) by mouth 2 (two) times daily.   20 tablet   0    BP 208/96  Pulse 68  Temp(Src) 98.4 F (36.9 C) (Oral)  Resp 20  Ht 5\' 7"  (1.702 m)  Wt 213 lb (96.616 kg)  BMI 33.35 kg/m2  SpO2 99%  LMP 08/21/2011 Physical Exam  Nursing note and vitals reviewed. Constitutional: She appears well-developed and well-nourished.  HENT:  Head: Normocephalic and atraumatic.  Eyes: Conjunctivae are normal.  Neck: Normal range of motion.  Cardiovascular: Normal rate, regular rhythm, normal heart sounds and intact distal pulses.   Pulmonary/Chest: Effort normal and breath sounds normal. She has no wheezes.  Abdominal: Soft. Bowel sounds are normal. She exhibits  distension. She exhibits no mass. There is tenderness in the right lower quadrant. There is no rigidity, no guarding and no tenderness at McBurney's point.  Abdomen normal sounding percussion.  ttp rlq without guard or rebound.  Musculoskeletal: Normal range of motion.  Neurological: She is alert.  Skin: Skin is  warm and dry.  Psychiatric: She has a normal mood and affect.    ED Course  Procedures (including critical care time) Labs Review Labs Reviewed  URINALYSIS, ROUTINE W REFLEX MICROSCOPIC - Abnormal; Notable for the following:    Glucose, UA >1000 (*)    Hgb urine dipstick SMALL (*)    All other components within normal limits  CBC WITH DIFFERENTIAL - Abnormal; Notable for the following:    Neutrophils Relative % 42 (*)    All other components within normal limits  COMPREHENSIVE METABOLIC PANEL - Abnormal; Notable for the following:    Glucose, Bld 334 (*)    Albumin 3.4 (*)    Total Bilirubin 0.2 (*)    All other components within normal limits  TROPONIN I  URINE MICROSCOPIC-ADD ON   Imaging Review Ct Abdomen Pelvis W Contrast  10/12/2013   CLINICAL DATA Abdominal pain.  EXAM CT ABDOMEN AND PELVIS WITH CONTRAST  TECHNIQUE Multidetector CT imaging of the abdomen and pelvis was performed using the standard protocol following bolus administration of intravenous contrast.  CONTRAST 32mL OMNIPAQUE IOHEXOL 300 MG/ML SOLN, 133mL OMNIPAQUE IOHEXOL 300 MG/ML SOLN  COMPARISON CT scan of September 16, 2013.  FINDINGS Visualized lung bases appear normal. No osseous abnormality is noted.  The liver, spleen and pancreas appear normal. Status post cholecystectomy. Adrenal glands appear normal. The kidneys appear normal. No hydronephrosis or renal obstruction is noted. The appendix appears normal. There is no evidence of bowel obstruction. Urinary bladder appears normal. 2.9 cm right ovarian cyst is noted. No abnormal fluid collection is noted. No significant adenopathy is noted.  IMPRESSION  Status post cholecystectomy.  2.9 cm right ovarian cyst is noted which is most likely physiologic.  No hydronephrosis or renal obstruction is identified.  No other significant abnormality seen in the abdomen or pelvis.  SIGNATURE  Electronically Signed   By: Sabino Dick M.D.   On: 10/12/2013 20:44   Dg Abd Acute W/chest  10/12/2013   CLINICAL DATA Right lower quadrant pain, painful urination  EXAM ACUTE ABDOMEN SERIES (ABDOMEN 2 VIEW & CHEST 1 VIEW)  COMPARISON DG CHEST 2 VIEW dated 09/16/2013; CT ANGIO CHEST AORTA W/CM & WO/CM dated 08/18/2013; DG CHEST 2 VIEW dated 08/25/2012; DG CHEST 2 VIEW dated 06/19/2012  FINDINGS No focal consolidation, pleural effusion or pneumothorax. Mild bilateral interstitial prominence. . Stable cardiomegaly. The osseous structures are unremarkable.  There is no bowel dilatation to suggest obstruction. There are no air-fluid levels. There is no evidence of pneumoperitoneum, portal venous gas, or pneumatosis.  Osseous structures are unremarkable.  IMPRESSION Negative abdominal radiographs.  No acute cardiopulmonary disease.  SIGNATURE  Electronically Signed   By: Kathreen Devoid   On: 10/12/2013 19:22     EKG Interpretation None      MDM   Final diagnoses:  Ovarian cyst, right    Patients labs and/or radiological studies were viewed and considered during the medical decision making and disposition process. Pt was advised f/u with her pcp Royce Macadamia with the health dept.  Advised she may need f/u also with gyn if symptoms do not resolve.  Prescribed hydrocodone, naproxen,  Encouraged heat pad. Also advised recheck of bp when pain improved as it remained substantially elevated during todays visit.  She had no signs/ sx of end organ damage, encouraged f/u with her provider and to continue taking her bp medicines.    Evalee Jefferson, PA-C 10/13/13 0121

## 2013-10-12 NOTE — ED Notes (Signed)
BP elevated. 

## 2013-10-12 NOTE — ED Notes (Addendum)
Pt c/o right lower abd pain and  that started this am, denies any n/v/d, chills, fever, admits to headaches today and that when she urinates the lower abd pain is worse. Pt admits to one episode of mid center chest pain that occurred for a few minutes while walking this am, it went away after she went to lay down,

## 2013-10-17 NOTE — ED Provider Notes (Signed)
Medical screening examination/treatment/procedure(s) were performed by non-physician practitioner and as supervising physician I was immediately available for consultation/collaboration.   EKG Interpretation   Date/Time:  Tuesday October 12 2013 18:25:16 EDT Ventricular Rate:  67 PR Interval:  190 QRS Duration: 86 QT Interval:  446 QTC Calculation: 471 R Axis:   22 Text Interpretation:  Normal sinus rhythm Possible Left atrial enlargement  Possible Anterior infarct (cited on or before 07-Jun-2011) Abnormal ECG  When compared with ECG of 18-Aug-2013 11:44, No significant change was  found ED PHYSICIAN INTERPRETATION AVAILABLE IN [facility] [place] Confirmed  by TEST, Record ([id number]) on 10/14/2013 7:09:50 AM       Virgel Manifold, MD 10/17/13 [phone]

## 2014-01-18 ENCOUNTER — Emergency Department (HOSPITAL_COMMUNITY)
Admission: EM | Admit: 2014-01-18 | Discharge: 2014-01-18 | Disposition: A | Discharge status: Home / Self Care | Payer: Self-pay | Attending: Emergency Medicine | Admitting: Emergency Medicine

## 2014-01-18 ENCOUNTER — Encounter (HOSPITAL_COMMUNITY): Payer: Self-pay | Admitting: Emergency Medicine

## 2014-01-18 DIAGNOSIS — R220 Localized swelling, mass and lump, head: Secondary | ICD-10-CM | POA: Insufficient documentation

## 2014-01-18 DIAGNOSIS — J449 Chronic obstructive pulmonary disease, unspecified: Secondary | ICD-10-CM | POA: Insufficient documentation

## 2014-01-18 DIAGNOSIS — M129 Arthropathy, unspecified: Secondary | ICD-10-CM | POA: Insufficient documentation

## 2014-01-18 DIAGNOSIS — G5600 Carpal tunnel syndrome, unspecified upper limb: Secondary | ICD-10-CM | POA: Insufficient documentation

## 2014-01-18 DIAGNOSIS — K0889 Other specified disorders of teeth and supporting structures: Secondary | ICD-10-CM

## 2014-01-18 DIAGNOSIS — G5602 Carpal tunnel syndrome, left upper limb: Secondary | ICD-10-CM

## 2014-01-18 DIAGNOSIS — K089 Disorder of teeth and supporting structures, unspecified: Secondary | ICD-10-CM | POA: Insufficient documentation

## 2014-01-18 DIAGNOSIS — E119 Type 2 diabetes mellitus without complications: Secondary | ICD-10-CM | POA: Insufficient documentation

## 2014-01-18 DIAGNOSIS — I509 Heart failure, unspecified: Secondary | ICD-10-CM | POA: Insufficient documentation

## 2014-01-18 DIAGNOSIS — Z791 Long term (current) use of non-steroidal anti-inflammatories (NSAID): Secondary | ICD-10-CM | POA: Insufficient documentation

## 2014-01-18 DIAGNOSIS — F17200 Nicotine dependence, unspecified, uncomplicated: Secondary | ICD-10-CM | POA: Insufficient documentation

## 2014-01-18 DIAGNOSIS — Z79899 Other long term (current) drug therapy: Secondary | ICD-10-CM | POA: Insufficient documentation

## 2014-01-18 DIAGNOSIS — J4489 Other specified chronic obstructive pulmonary disease: Secondary | ICD-10-CM | POA: Insufficient documentation

## 2014-01-18 DIAGNOSIS — I10 Essential (primary) hypertension: Secondary | ICD-10-CM | POA: Insufficient documentation

## 2014-01-18 DIAGNOSIS — R221 Localized swelling, mass and lump, neck: Secondary | ICD-10-CM

## 2014-01-18 DIAGNOSIS — K029 Dental caries, unspecified: Secondary | ICD-10-CM | POA: Insufficient documentation

## 2014-01-18 MED ORDER — AMOXICILLIN 500 MG PO CAPS: 500.0000 mg | ORAL_CAPSULE | Freq: Three times a day (TID) | ORAL | Status: DC

## 2014-01-18 MED ORDER — HYDROCODONE-ACETAMINOPHEN 5-325 MG PO TABS: ORAL_TABLET | ORAL | Status: DC

## 2014-01-18 MED ORDER — AMOXICILLIN 250 MG PO CAPS
500.0000 mg | ORAL_CAPSULE | Freq: Once | ORAL | Status: AC
  Administered 2014-01-18: 500 mg via ORAL
  Filled 2014-01-18: qty 2

## 2014-01-18 MED ORDER — OXYCODONE-ACETAMINOPHEN 5-325 MG PO TABS
1.0000 | ORAL_TABLET | Freq: Once | ORAL | Status: AC
  Administered 2014-01-18: 1 via ORAL
  Filled 2014-01-18: qty 1

## 2014-01-18 NOTE — Care Management Note (Signed)
ED/CM noted patient did not have health insurance and/or PCP listed in the computer.  Patient was given the [place] resource handout with information on the clinics, food pantries, and the handout for new health insurance sign-up.  Patient expressed appreciation for information received. 

## 2014-01-18 NOTE — ED Notes (Signed)
Tammy PA at bedside,  

## 2014-01-18 NOTE — Discharge Instructions (Signed)
Carpal Tunnel Syndrome The carpal tunnel is an area under the skin of the palm of your hand. Nerves, blood vessels, and strong tissues (tendons) pass through the tunnel. The tunnel can become puffy (swollen). If this happens, a nerve can be pinched in the wrist. This causes carpal tunnel syndrome.  HOME CARE  Take all medicine as told by your doctor.  If you were given a splint, wear it as told. Wear it at night or at times when your doctor told you to.  Rest your wrist from the activity that causes your pain.  Put ice on your wrist after long periods of wrist activity.  Put ice in a plastic bag.  Place a towel between your skin and the bag.  Leave the ice on for 15-20 minutes, 03-04 times a day.  Keep all doctor visits as told. GET HELP RIGHT AWAY IF:  You have new problems you cannot explain.  Your problems get worse and medicine does not help. MAKE SURE YOU:   Understand these instructions.  Will watch your condition.  Will get help right away if you are not doing well or get worse. Document Released: 07/11/2011 Document Revised: 10/14/2011 Document Reviewed: 07/11/2011 [facility] Patient Information 2014 [place], [place].  Dental Pain Toothache is pain in or around a tooth. It may get worse with chewing or with cold or heat.  HOME CARE  Your dentist may use a numbing medicine during treatment. If so, you may need to avoid eating until the medicine wears off. Ask your dentist about this.  Only take medicine as told by your dentist or doctor.  Avoid chewing food near the painful tooth until after all treatment is done. Ask your dentist about this. GET HELP RIGHT AWAY IF:   The problem gets worse or new problems appear.  You have a fever.  There is redness and puffiness (swelling) of the face, jaw, or neck.  You cannot open your mouth.  There is pain in the jaw.  There is very bad pain that is not helped by medicine. MAKE SURE YOU:   Understand these  instructions.  Will watch your condition.  Will get help right away if you are not doing well or get worse. Document Released: 01/08/2008 Document Revised: 10/14/2011 Document Reviewed: 01/08/2008 [facility] Patient Information 2014 [place], [place].   Emergency Department Resource Guide 1) Find a Doctor and Pay Out of Pocket Although you won't have to find out who is covered by your insurance plan, it is a good idea to ask around and get recommendations. You will then need to call the office and see if the doctor you have chosen will accept you as a new patient and what types of options they offer for patients who are self-pay. Some doctors offer discounts or will set up payment plans for their patients who do not have insurance, but you will need to ask so you aren't surprised when you get to your appointment.  2) Contact Your Local Health Department Not all health departments have doctors that can see patients for sick visits, but many do, so it is worth a call to see if yours does. If you don't know where your local health department is, you can check in your phone book. The CDC also has a tool to help you locate your state's health department, and many state websites also have listings of all of their local health departments.  3) Find a [facility] If your illness is not likely to be very  severe or complicated, you may want to try a walk in clinic. These are popping up all over the country in pharmacies, drugstores, and shopping centers. They're usually staffed by nurse practitioners or physician assistants that have been trained to treat common illnesses and complaints. They're usually fairly quick and inexpensive. However, if you have serious medical issues or chronic medical problems, these are probably not your best option.  No Primary Care Doctor: - Call Health Connect at  [phone] - they can help you locate a primary care doctor that  accepts your insurance, provides certain services,  etc. - Physician Referral Service- [phone]  Chronic Pain Problems: Organization         Address  Phone   Notes  [facility]  [phone] Patients need to be referred by their primary care doctor.   Medication Assistance: Organization         Address  Phone   Notes  [facility] Medication [facility] [address] [place], [place] [address] [phone] --Must be a resident of [facility] -- Must have NO insurance coverage whatsoever (no Medicaid/ Medicare, etc.) -- The pt. MUST have a primary care doctor that directs their care regularly and follows them in the community   MedAssist  [phone]   [employer]  [phone]    Agencies that provide inexpensive medical care: Organization         Address  Phone   Notes  [place]  [phone]   [facility]    [phone]   [facility] [place], [place] [address] [phone]   [place] [address], [place] [phone]   Planned Parenthood    [phone]   [facility]    [phone]   [place] and [address], [place] Phone:  [phone], Fax:  [phone] Hours of Operation:  9 am - 6 pm, M-F.  Also accepts Medicaid/Medicare and self-pay.  [facility] for [place] [address], [place] Phone: [phone], Fax: [phone]. Hours of Operation:  8:30 am - 5:30 pm, M-F.  Also accepts Medicaid and self-pay.  [facility] [place] [address] [place] Phone: [phone]   [place], [place], [place] [phone], Ext. [phone] Mondays & Thursdays: 7-9 AM.  First 15 patients are seen on a first come, first serve basis.    [place] Providers:  Organization         Address  Phone   Notes  [facility] [address], [place] [phone] Also accepts self-pay patients.  [facility] [address], [place]  [phone]   [place], [address], [place] [phone]   [facility] [address], [place] [phone]   Lucianne Lei [address], [place]   [phone] Only accepts [name] Access [place] patients after they have their name applied to their card.   Self-Pay (no insurance) in [facility]:  Organization         Address  Phone   Notes  Sickle Cell Patients, [facility] Internal Medicine [place] [phone]   [facility]gent Care [phone]  [place] [phone]   [facility] [place]  [address], [place] [phone]   [facility]/Dr. Osei-Bonsu  [address] [place] or [address], [place] [phone] Phone number for both [place] and [place] locations is the same.  [facility] [address], [place] [phone]   [facility] [address], [place] or [address] [phone]   [facility] [address] [place] [phone], phone; [phone], fax Sees patients 1st and 3rd Saturday of every month.  Must not qualify for public or private insurance (i.e. Medicaid, Medicare, [place] Health Choice, Veterans' Benefits)  Household income should be no more than 200% of the poverty level The clinic cannot treat you if you are pregnant or think you are pregnant  Sexually transmitted diseases are not treated at the clinic.    Dental Care: Organization         Address  Phone  Notes  [facility] Department of [facility] [place] [phone] Accepts children up to  age 83 who are enrolled in [place] or [place]; pregnant women with a Medicaid card; and children who have applied for Medicaid or [place] Health Choice, but were declined, whose parents can pay a reduced fee at time of service.  [facility] Department of [facility]  [address], [place] [phone] Accepts children up to age 46 who are enrolled in [place] or [place]; pregnant women with a Medicaid card; and children who have applied for Medicaid or [place] Health Choice, but were declined, whose parents can pay a reduced fee at time of service.  [place] Adult [facility] Access PROGRAM  [place] [phone] Patients are seen by appointment only. Walk-ins are not accepted. [place] will see patients 28 years of age and older. Monday - Tuesday (8am-5pm) Most Wednesdays (8:30-5pm) $30 per visit, cash only  [facility] Adult Dental Access PROGRAM  [address], [facility] [phone] Patients are seen by appointment only. Walk-ins are not accepted. [place] will see patients 1 years of age and older. One Wednesday Evening (Monthly: Volunteer Based).  $30 per visit, cash only  [place]  [phone] for adults; Children under age 23, call Graduate Pediatric Dentistry at [phone]. Children aged 33-14, please call [phone] to request a pediatric application.  Dental services are provided in all areas of dental care including fillings, crowns and bridges, complete and partial dentures, implants, gum treatment, root canals, and extractions. Preventive care is also provided. Treatment is provided to both adults and children. Patients are selected via a lottery and there is often a waiting list.   [facility] [address] [place]  [phone] [email or web address]   [facility] [address] [place], [place] [phone], Ext. [phone] Second and Fourth Thursday of  each month, opens at 6:30 AM; Clinic ends at 9 AM.  Patients are seen on a first-come first-served basis, and a limited number are seen during each clinic.   [facility]  [address] Hillard Danker [place], [place] [phone]   Eligibility Requirements You must have lived in [place], [place], or [place] counties for at least the last three months.   You  cannot be eligible for state or federal sponsored [employer], including [employer], [place], or [employer].   You generally cannot be eligible for healthcare insurance through your employer.    How to apply: Eligibility screenings are held every Tuesday and Wednesday afternoon from 1:00 pm until 4:00 pm. You do not need an appointment for the interview!  [facility] [address] [place], [place]   [place]  [place] Department  [place]  [phone]    Behavioral Health Resources in the Community: Intensive Outpatient Programs Organization         Address  Phone  Notes  [place]. [address] [place], [place] [phone]   [facility]patient [address] [place], [place]   ADS: Alcohol & Drug Sv[facility] [address] [place], [place]   [place] [address]  [place], [place] or [phone]   Substance Abuse Resources Organization         Address  Phone  Notes  Alcohol and Drug Services  [phone]   [place]  [phone]   [facility] [place]   Chinita Pester  [phone]   Residential & Outpatient Substance Abuse Program  [phone]   Psychological Services Organization         Address  Phone  Notes  [facility] [place]  [place]  [phone]   [place] [address],  [place] or [phone]    Mobile Crisis Teams Organization         Address  Phone  Notes  [facility], Mobile Crisis Care Unit  [phone]   [facility]  [address] [place], [place]   [place] [address] [place] [phone]    Self-Help/Support Groups Organization         Address  Phone             Notes  [place]. of [place] - variety of support groups  [place] Call for more information  Narcotics Anonymous (NA), [facility] [address], [employer] [place]  2 meetings at this location   Special educational needs teacher         Address  Phone  Notes  [facility]idential [facility] [place],    [place]  [phone]   [facility]  [address] [place] [address], [place], [place]   [place], [place] [phone] Admissions: 8am-3pm M-F  Incentives Substance [place] [address],    [place], [place] [phone]   [facility] [address] [place], [place], [place]   [facility] [address],  [place], [place]   [facility] Programs - Intensive Outpatient [address], [place], [place]   [facility] ([address] [place].,  [place], [place] [phone] or [phone]   [facility] (RTS) [address] [place], [place] Accepts Medicaid  [facility] [place] [address],  [place] 1-2367103892 Substance Abuse/Addiction Treatment   [facility] Organization         Address  Phone  Notes  [facility]  [phone]   Domenic Schwab, PhD [place], [address]  [place], [place]   [phone] or [phone]) [place], [place] [phone]     [address] [place], [place] [phone] Insurance/Medicaid/sponsorship through [facility] and Families [address]                                    [place], [place] [phone] [place], [place] [phone]    Dr. Adele Schilder  [phone]   [facility] Dept. 1) [address] [place] 2) [place] 3)  [place] [address], [place] [phone]  [phone]   [place] [phone] or [phone] (After Hours)

## 2014-01-18 NOTE — ED Notes (Signed)
Upper dental pain bilateral x one day. Swelling slight noted to right side cheek area. Pt also c/o of her "carpal tunnell" acting up, pain from left hand up to elbow x 2 days. Nad.

## 2014-01-18 NOTE — ED Notes (Signed)
Pt states that she started having pain to left cheek area yesterday, woke up with right side cheek pain and swelling today, pt also c/o pain that starts at fingertips on left hand and radiates up to left elbow, has hx of carpal tunnel and states " i think that it may be acting up" denies any injury, cms intact all extremities, does have swelling noted to right facial area.

## 2014-01-20 NOTE — ED Provider Notes (Signed)
CSN: [id number]     Arrival date & time 01/18/14  0847 History   First MD Initiated Contact with Patient 01/18/14 [phone]     Chief Complaint  Patient presents with  . Dental Pain  . Arm Pain     (Consider location/radiation/quality/duration/timing/severity/associated sxs/prior Treatment) Patient is a 48 y.o. female presenting with tooth pain and arm pain. The history is provided by the patient.  Dental Pain Location:  Upper Upper teeth location:  4/RU 2nd bicuspid and 5/RU 1st bicuspid Quality:  Throbbing and sharp Severity:  Severe Onset quality:  Gradual Duration:  1 day Timing:  Constant Progression:  Worsening Chronicity:  New Context: abscess, dental caries and poor dentition   Context: filling still in place, not malocclusion and not trauma   Relieved by:  Nothing Worsened by:  Cold food/drink, hot food/drink and touching Ineffective treatments:  NSAIDs Associated symptoms: facial pain and facial swelling   Associated symptoms: no congestion, no difficulty swallowing, no drooling, no fever, no gum swelling, no headaches, no neck pain, no neck swelling, no oral bleeding, no oral lesions and no trismus   Risk factors: diabetes, lack of dental care, periodontal disease and smoking   Arm Pain Associated symptoms include arthralgias and joint swelling. Pertinent negatives include no abdominal pain, chest pain, chills, congestion, fever, headaches, nausea, neck pain, numbness, vomiting or weakness.    Patient also c/o pain to her left wrist for 2 days.  She states that she has hx of carpal tunnel in her wrist and states this pain feels similar to previous flares.  She reports intermittent sharp pains and tingling that radiate from her wrist up to her forearm.  She denies swelling, redness, wounds, or chest pain, shortness of breath.    Past Medical History  Diagnosis Date  . Type 2 diabetes mellitus   . Essential hypertension, benign   . GERD (gastroesophageal reflux disease)     . COPD (chronic obstructive pulmonary disease)   . Arthritis   . Normal coronary arteries     3/10 - following abnormal Myoview  . Diabetes mellitus   . CHF (congestive heart failure)    Past Surgical History  Procedure Laterality Date  . Tubal ligation    . Cholecystectomy    . Cesarean section    . Abdominal hysterectomy  09/10/2011    Procedure: HYSTERECTOMY ABDOMINAL;  Surgeon: Jonnie Kind, MD;  Location: [facility];  Service: Gynecology;  Laterality: N/A;  Abdominal hysterectomy  . Scar revision  09/10/2011    Procedure: SCAR REVISION;  Surgeon: Jonnie Kind, MD;  Location: [facility];  Service: Gynecology;  Laterality: N/A;  Wide Excision of old Cicatrix   Family History  Problem Relation Age of Onset  . Cirrhosis Mother   . Diabetes type II Father   . Diabetes type II Sister   . Anesthesia problems Neg Hx   . Hypotension Neg Hx   . Malignant hyperthermia Neg Hx   . Pseudochol deficiency Neg Hx    History  Substance Use Topics  . Smoking status: Current Every Day Smoker -- 0.25 packs/day for 29 years    Types: Cigarettes  . Smokeless tobacco: Never Used     Comment: 1 cigarette a day  . Alcohol Use: 0.0 oz/week     Comment: occ   OB History   Grav Para Term Preterm Abortions TAB SAB Ect Mult Living                 Review  of Systems  Constitutional: Negative for fever and chills.  HENT: Positive for facial swelling. Negative for congestion, drooling and mouth sores.   Respiratory: Negative for chest tightness and shortness of breath.   Cardiovascular: Negative for chest pain.  Gastrointestinal: Negative for nausea, vomiting and abdominal pain.  Genitourinary: Negative for dysuria and difficulty urinating.  Musculoskeletal: Positive for arthralgias and joint swelling. Negative for neck pain.  Skin: Negative for color change and wound.  Neurological: Negative for dizziness, syncope, speech difficulty, weakness, numbness and headaches.  All other systems reviewed and  are negative.     Allergies  Bee venom  Home Medications   Prior to Admission medications   Medication Sig Start Date End Date Taking? Authorizing Provider  albuterol (PROVENTIL HFA;VENTOLIN HFA) 108 (90 BASE) MCG/ACT inhaler Inhale 1-2 puffs into the lungs every 6 (six) hours as needed for wheezing. 08/25/12  Yes Nat Christen, MD  ibuprofen (ADVIL,MOTRIN) 200 MG tablet Take 400-800 mg by mouth every 6 (six) hours as needed for moderate pain.    Yes Historical Provider, MD  amoxicillin (AMOXIL) 500 MG capsule Take 1 capsule (500 mg total) by mouth 3 (three) times daily. For 10 days 01/18/14   Tammy L. Triplett, PA-C  HYDROcodone-acetaminophen (NORCO/VICODIN) 5-325 MG per tablet Take one-two tabs po q 4-6 hrs prn pain 01/18/14   Tammy L. Triplett, PA-C   BP 169/97  Pulse 90  Temp(Src) 98.1 F (36.7 C) (Oral)  Resp 19  SpO2 100%  LMP 08/21/2011 Physical Exam  Nursing note and vitals reviewed. Constitutional: She is oriented to [place], place, [name] No distress.  HENT:  Head: Normocephalic and atraumatic.  Mouth/Throat: Uvula is midline, oropharynx is clear and moist and mucous membranes are normal. No trismus in the jaw. Dental caries present. No uvula swelling.    Poor dental hygiene and multiple dental caries.  No fluctuance of the gumline.  Mild to moderate right facial edema at the nasolabial fold  Cardiovascular: Normal rate, regular rhythm and normal heart sounds.   No murmur heard. Pulmonary/Chest: Effort normal and breath sounds normal. No respiratory distress.  Abdominal: Soft. She exhibits no distension. There is no tenderness.  Musculoskeletal: She exhibits tenderness. She exhibits no edema.  ttp of the palmar aspect of the distal left wrist.  Radial pulse is brisk, distal sensation intact.  CR< 2 sec.  No bruising or bony deformity.  Patient has full ROM of wrist and fingers. Compartments of left arm soft.  Neurological:  She is alert and oriented to [place], place, and time. She exhibits normal muscle tone. Coordination normal.  Skin: Skin is warm and dry.    ED Course  Procedures (including critical care time) Labs Review Labs Reviewed - No data to display  Imaging Review No results found.   EKG Interpretation None      MDM   Final diagnoses:  Pain, dental  Carpal tunnel syndrome of left wrist    Left wrist splint, pain improved, remains NV intact.  Pt with likely dental abscess.  She agrees to close f/u with dentist.  Referral info given.  Also advised to return here if the sx's worsen.  No concerning sx's for infection to floor of the mouth or deep structures of the neck.  Amoxil written at pt's request for inexpensive medication.  Agrees to return if needed.  She is otherwise well appearing and stable for discharge.    Tammy L. Vanessa , PA-C 01/20/14 2158

## 2014-01-21 NOTE — ED Provider Notes (Signed)
Medical screening examination/treatment/procedure(s) were performed by non-physician practitioner and as supervising physician I was immediately available for consultation/collaboration.   EKG Interpretation None        Maudry Diego, MD 01/21/14 [phone]

## 2014-02-22 ENCOUNTER — Encounter (HOSPITAL_COMMUNITY): Payer: Self-pay | Admitting: Emergency Medicine

## 2014-02-22 ENCOUNTER — Emergency Department (HOSPITAL_COMMUNITY)
Admission: EM | Admit: 2014-02-22 | Discharge: 2014-02-22 | Disposition: A | Discharge status: Home / Self Care | Payer: Self-pay | Attending: Emergency Medicine | Admitting: Emergency Medicine

## 2014-02-22 DIAGNOSIS — J4489 Other specified chronic obstructive pulmonary disease: Secondary | ICD-10-CM | POA: Insufficient documentation

## 2014-02-22 DIAGNOSIS — F17200 Nicotine dependence, unspecified, uncomplicated: Secondary | ICD-10-CM | POA: Insufficient documentation

## 2014-02-22 DIAGNOSIS — Z8719 Personal history of other diseases of the digestive system: Secondary | ICD-10-CM | POA: Insufficient documentation

## 2014-02-22 DIAGNOSIS — M129 Arthropathy, unspecified: Secondary | ICD-10-CM | POA: Insufficient documentation

## 2014-02-22 DIAGNOSIS — T63461A Toxic effect of venom of wasps, accidental (unintentional), initial encounter: Secondary | ICD-10-CM | POA: Insufficient documentation

## 2014-02-22 DIAGNOSIS — Z91030 Bee allergy status: Secondary | ICD-10-CM

## 2014-02-22 DIAGNOSIS — I509 Heart failure, unspecified: Secondary | ICD-10-CM | POA: Insufficient documentation

## 2014-02-22 DIAGNOSIS — E119 Type 2 diabetes mellitus without complications: Secondary | ICD-10-CM | POA: Insufficient documentation

## 2014-02-22 DIAGNOSIS — Z792 Long term (current) use of antibiotics: Secondary | ICD-10-CM | POA: Insufficient documentation

## 2014-02-22 DIAGNOSIS — Y9389 Activity, other specified: Secondary | ICD-10-CM | POA: Insufficient documentation

## 2014-02-22 DIAGNOSIS — J449 Chronic obstructive pulmonary disease, unspecified: Secondary | ICD-10-CM | POA: Insufficient documentation

## 2014-02-22 DIAGNOSIS — Z79899 Other long term (current) drug therapy: Secondary | ICD-10-CM | POA: Insufficient documentation

## 2014-02-22 DIAGNOSIS — Y9289 Other specified places as the place of occurrence of the external cause: Secondary | ICD-10-CM | POA: Insufficient documentation

## 2014-02-22 DIAGNOSIS — I10 Essential (primary) hypertension: Secondary | ICD-10-CM | POA: Insufficient documentation

## 2014-02-22 DIAGNOSIS — T6391XA Toxic effect of contact with unspecified venomous animal, accidental (unintentional), initial encounter: Secondary | ICD-10-CM | POA: Insufficient documentation

## 2014-02-22 LAB — CBG MONITORING, ED: Glucose-Capillary: 333 mg/dL — ABNORMAL HIGH (ref 70–99)

## 2014-02-22 MED ORDER — DIPHENHYDRAMINE HCL 25 MG PO CAPS
50.0000 mg | ORAL_CAPSULE | Freq: Once | ORAL | Status: AC
  Administered 2014-02-22: 50 mg via ORAL
  Filled 2014-02-22: qty 2

## 2014-02-22 NOTE — ED Provider Notes (Signed)
CSN: [id number]     Arrival date & time 02/22/14  0915 History   First MD Initiated Contact with Patient 02/22/14 [phone]     Chief Complaint  Patient presents with  . Insect Bite     (Consider location/radiation/quality/duration/timing/severity/associated sxs/prior Treatment) HPI Comments: Pt state that she was stung by a bee on her right foot over 1 hour ago. Pt states that she has an epi pen for this but she didn't have it with her. States that initially she had a hard time breathing but she started to breathe in a brown bag the and symptoms got better. Denies rash or swelling to the foot. Pt has not taking any medications for reaction. Denies mouth swelling  The history is provided by the patient. No language interpreter was used.    Past Medical History  Diagnosis Date  . Type 2 diabetes mellitus   . Essential hypertension, benign   . GERD (gastroesophageal reflux disease)   . COPD (chronic obstructive pulmonary disease)   . Arthritis   . Normal coronary arteries     3/10 - following abnormal Myoview  . Diabetes mellitus   . CHF (congestive heart failure)    Past Surgical History  Procedure Laterality Date  . Tubal ligation    . Cholecystectomy    . Cesarean section    . Abdominal hysterectomy  09/10/2011    Procedure: HYSTERECTOMY ABDOMINAL;  Surgeon: Jonnie Kind, MD;  Location: [facility];  Service: Gynecology;  Laterality: N/A;  Abdominal hysterectomy  . Scar revision  09/10/2011    Procedure: SCAR REVISION;  Surgeon: Jonnie Kind, MD;  Location: [facility];  Service: Gynecology;  Laterality: N/A;  Wide Excision of old Cicatrix   Family History  Problem Relation Age of Onset  . Cirrhosis Mother   . Diabetes type II Father   . Diabetes type II Sister   . Anesthesia problems Neg Hx   . Hypotension Neg Hx   . Malignant hyperthermia Neg Hx   . Pseudochol deficiency Neg Hx    History  Substance Use Topics  . Smoking status: Current Every Day Smoker -- 0.25 packs/day for 29  years    Types: Cigarettes  . Smokeless tobacco: Never Used     Comment: 1 cigarette a day  . Alcohol Use: 0.0 oz/week     Comment: occ   OB History   Grav Para Term Preterm Abortions TAB SAB Ect Mult Living                 Review of Systems  Constitutional: Negative.   Respiratory: Negative for cough.   Cardiovascular: Negative.       Allergies  Bee venom  Home Medications   Prior to Admission medications   Medication Sig Start Date End Date Taking? Authorizing Provider  albuterol (PROVENTIL HFA;VENTOLIN HFA) 108 (90 BASE) MCG/ACT inhaler Inhale 1-2 puffs into the lungs every 6 (six) hours as needed for wheezing. 08/25/12   Nat Christen, MD  amoxicillin (AMOXIL) 500 MG capsule Take 1 capsule (500 mg total) by mouth 3 (three) times daily. For 10 days 01/18/14   Tammy L. Triplett, PA-C  HYDROcodone-acetaminophen (NORCO/VICODIN) 5-325 MG per tablet Take one-two tabs po q 4-6 hrs prn pain 01/18/14   Tammy L. Triplett, PA-C  ibuprofen (ADVIL,MOTRIN) 200 MG tablet Take 400-800 mg by mouth every 6 (six) hours as needed for moderate pain.     Historical Provider, MD   BP 173/100  Pulse 95  Temp(Src) 97.6  F (36.4 C) (Oral)  Resp 18  SpO2 100%  LMP 08/21/2011 Physical Exam  Nursing note and vitals reviewed. Constitutional: She is oriented to [place], place, [name]  HENT:  Head: Normocephalic and atraumatic.  Mouth/Throat: Oropharynx is clear and moist.  Cardiovascular: Normal rate and regular rhythm.   Pulmonary/Chest: Effort normal and breath sounds normal.  Neurological: She is oriented to [place], place, and time.  Skin:  Localized swelling to the  Left great toe. No swelling or redness tracking up the foot    ED Course  Procedures (including critical care time) Labs Review Labs Reviewed  CBG MONITORING, ED - Abnormal; Notable for the following:    Glucose-Capillary 333 (*)    All other components within normal limits     Imaging Review No results found.   EKG Interpretation None      MDM   Final diagnoses:  Bee sting allergy    No systemtic reaction noted. Pt is doing fine after the benadryl. No swelling noted of the foot. Discussed blood pressure and sugar with pt. Pt is okay with going home. Pt has epi at home    Glendell Docker, NP 02/22/14 1106

## 2014-02-22 NOTE — ED Notes (Signed)
Bee sting to left foot

## 2014-02-22 NOTE — ED Notes (Addendum)
Pt c/o beesting to top of left foot about 8am today, states that the area is stinging, ice pack given for comfort, pt alert, oriented, hyperventilating at times and complaining of dizziness,  comfort measures provided,

## 2014-02-22 NOTE — ED Provider Notes (Signed)
Medical screening examination/treatment/procedure(s) were performed by non-physician practitioner and as supervising physician I was immediately available for consultation/collaboration.   EKG Interpretation None        Fredia Sorrow, MD 02/22/14 1114

## 2014-02-22 NOTE — Discharge Instructions (Signed)
Bee, Wasp, or Hornet Sting Your caregiver has diagnosed you as having an insect sting. An insect sting appears as a red lump in the skin that sometimes has a tiny hole in the center, or it may have a stinger in the center of the wound. The most common stings are from wasps, hornets and bees. Individuals have different reactions to insect stings.  A normal reaction may cause pain, swelling, and redness around the sting site.  A localized allergic reaction may cause swelling and redness that extends beyond the sting site.  A large local reaction may continue to develop over the next 12 to 36 hours.  On occasion, the reactions can be severe (anaphylactic reaction). An anaphylactic reaction may cause wheezing; difficulty breathing; chest pain; fainting; raised, itchy, red patches on the skin; a sick feeling to your stomach (nausea); vomiting; cramping; or diarrhea. If you have had an anaphylactic reaction to an insect sting in the past, you are more likely to have one again. HOME CARE INSTRUCTIONS   With bee stings, a small sac of poison is left in the wound. Brushing across this with something such as a credit card, or anything similar, will help remove this and decrease the amount of the reaction. This same procedure will not help a wasp sting as they do not leave behind a stinger and poison sac.  Apply a cold compress for 10 to 20 minutes every hour for 1 to 2 days, depending on severity, to reduce swelling and itching.  To lessen pain, a paste made of water and baking soda may be rubbed on the bite or sting and left on for 5 minutes.  To relieve itching and swelling, you may use take medication or apply medicated creams or lotions as directed.  Only take over-the-counter or prescription medicines for pain, discomfort, or fever as directed by your caregiver.  Wash the sting site daily with soap and water. Apply antibiotic ointment on the sting site as directed.  If you suffered a severe  reaction:  If you did not require hospitalization, an adult will need to stay with you for 24 hours in case the symptoms return.  You may need to wear a medical bracelet or necklace stating the allergy.  You and your family need to learn when and how to use an anaphylaxis kit or epinephrine injection.  If you have had a severe reaction before, always carry your anaphylaxis kit with you. SEEK MEDICAL CARE IF:   None of the above helps within 2 to 3 days.  The area becomes red, warm, tender, and swollen beyond the area of the bite or sting.  You have an oral temperature above 102 F (38.9 C). SEEK IMMEDIATE MEDICAL CARE IF:  You have symptoms of an allergic reaction which are:  Wheezing.  Difficulty breathing.  Chest pain.  Lightheadedness or fainting.  Itchy, raised, red patches on the skin.  Nausea, vomiting, cramping or diarrhea. ANY OF THESE SYMPTOMS MAY REPRESENT A SERIOUS PROBLEM THAT IS AN EMERGENCY. Do not wait to see if the symptoms will go away. Get medical help right away. Call your local emergency services (911 in [place] DO NOT drive yourself to the hospital. MAKE SURE YOU:   Understand these instructions.  Will watch your condition.  Will get help right away if you are not doing well or get worse. Document Released: 07/22/2005 Document Revised: 10/14/2011 Document Reviewed: 01/06/2010 [facility] Patient Information 2015 [place], [place]. This information is not intended to replace advice given  to you by your health care provider. Make sure you discuss any questions you have with your health care provider.

## 2014-05-03 ENCOUNTER — Emergency Department (HOSPITAL_COMMUNITY): Payer: Self-pay

## 2014-05-03 ENCOUNTER — Encounter (HOSPITAL_COMMUNITY): Payer: Self-pay | Admitting: Emergency Medicine

## 2014-05-03 ENCOUNTER — Emergency Department (HOSPITAL_COMMUNITY)
Admission: EM | Admit: 2014-05-03 | Discharge: 2014-05-03 | Disposition: A | Discharge status: Home / Self Care | Payer: Self-pay | Attending: Emergency Medicine | Admitting: Emergency Medicine

## 2014-05-03 DIAGNOSIS — R109 Unspecified abdominal pain: Secondary | ICD-10-CM | POA: Insufficient documentation

## 2014-05-03 DIAGNOSIS — Z7982 Long term (current) use of aspirin: Secondary | ICD-10-CM | POA: Insufficient documentation

## 2014-05-03 DIAGNOSIS — Z9851 Tubal ligation status: Secondary | ICD-10-CM | POA: Insufficient documentation

## 2014-05-03 DIAGNOSIS — Z9089 Acquired absence of other organs: Secondary | ICD-10-CM | POA: Insufficient documentation

## 2014-05-03 DIAGNOSIS — M129 Arthropathy, unspecified: Secondary | ICD-10-CM | POA: Insufficient documentation

## 2014-05-03 DIAGNOSIS — N949 Unspecified condition associated with female genital organs and menstrual cycle: Secondary | ICD-10-CM | POA: Insufficient documentation

## 2014-05-03 DIAGNOSIS — Z90710 Acquired absence of both cervix and uterus: Secondary | ICD-10-CM | POA: Insufficient documentation

## 2014-05-03 DIAGNOSIS — I10 Essential (primary) hypertension: Secondary | ICD-10-CM | POA: Insufficient documentation

## 2014-05-03 DIAGNOSIS — Z9889 Other specified postprocedural states: Secondary | ICD-10-CM | POA: Insufficient documentation

## 2014-05-03 DIAGNOSIS — E119 Type 2 diabetes mellitus without complications: Secondary | ICD-10-CM | POA: Insufficient documentation

## 2014-05-03 DIAGNOSIS — J449 Chronic obstructive pulmonary disease, unspecified: Secondary | ICD-10-CM | POA: Insufficient documentation

## 2014-05-03 DIAGNOSIS — F17200 Nicotine dependence, unspecified, uncomplicated: Secondary | ICD-10-CM | POA: Insufficient documentation

## 2014-05-03 DIAGNOSIS — R102 Pelvic and perineal pain: Secondary | ICD-10-CM

## 2014-05-03 DIAGNOSIS — J4489 Other specified chronic obstructive pulmonary disease: Secondary | ICD-10-CM | POA: Insufficient documentation

## 2014-05-03 DIAGNOSIS — K219 Gastro-esophageal reflux disease without esophagitis: Secondary | ICD-10-CM | POA: Insufficient documentation

## 2014-05-03 DIAGNOSIS — Z791 Long term (current) use of non-steroidal anti-inflammatories (NSAID): Secondary | ICD-10-CM | POA: Insufficient documentation

## 2014-05-03 DIAGNOSIS — I509 Heart failure, unspecified: Secondary | ICD-10-CM | POA: Insufficient documentation

## 2014-05-03 DIAGNOSIS — Z79899 Other long term (current) drug therapy: Secondary | ICD-10-CM | POA: Insufficient documentation

## 2014-05-03 LAB — CBC WITH DIFFERENTIAL/PLATELET
Basophils Absolute: 0 10*3/uL (ref 0.0–0.1)
Basophils Relative: 0 % (ref 0–1)
Eosinophils Absolute: 0.2 10*3/uL (ref 0.0–0.7)
Eosinophils Relative: 2 % (ref 0–5)
HCT: 43.7 % (ref 36.0–46.0)
Hemoglobin: 14.9 g/dL (ref 12.0–15.0)
Lymphocytes Relative: 44 % (ref 12–46)
Lymphs Abs: 3.2 10*3/uL (ref 0.7–4.0)
MCH: 28.7 pg (ref 26.0–34.0)
MCHC: 34.1 g/dL (ref 30.0–36.0)
MCV: 84.2 fL (ref 78.0–100.0)
Monocytes Absolute: 0.6 10*3/uL (ref 0.1–1.0)
Monocytes Relative: 8 % (ref 3–12)
Neutro Abs: 3.4 10*3/uL (ref 1.7–7.7)
Neutrophils Relative %: 46 % (ref 43–77)
Platelets: 224 10*3/uL (ref 150–400)
RBC: 5.19 MIL/uL — ABNORMAL HIGH (ref 3.87–5.11)
RDW: 13.1 % (ref 11.5–15.5)
WBC: 7.4 10*3/uL (ref 4.0–10.5)

## 2014-05-03 LAB — BASIC METABOLIC PANEL
Anion gap: 14 (ref 5–15)
BUN: 12 mg/dL (ref 6–23)
CO2: 22 mEq/L (ref 19–32)
Calcium: 9 mg/dL (ref 8.4–10.5)
Chloride: 100 mEq/L (ref 96–112)
Creatinine, Ser: 0.89 mg/dL (ref 0.50–1.10)
GFR calc Af Amer: 88 mL/min — ABNORMAL LOW (ref >90)
GFR calc non Af Amer: 76 mL/min — ABNORMAL LOW (ref >90)
Glucose, Bld: 350 mg/dL — ABNORMAL HIGH (ref 70–99)
Potassium: 3.9 mEq/L (ref 3.7–5.3)
Sodium: 136 mEq/L — ABNORMAL LOW (ref 137–147)

## 2014-05-03 LAB — WET PREP, GENITAL
Clue Cells Wet Prep HPF POC: NONE SEEN
Trich, Wet Prep: NONE SEEN
Yeast Wet Prep HPF POC: NONE SEEN

## 2014-05-03 LAB — URINE MICROSCOPIC-ADD ON

## 2014-05-03 LAB — URINALYSIS, ROUTINE W REFLEX MICROSCOPIC
Bilirubin Urine: NEGATIVE
Glucose, UA: >1000 mg/dL — AB
Hgb urine dipstick: NEGATIVE
Ketones, ur: NEGATIVE mg/dL
Leukocytes, UA: NEGATIVE
Nitrite: NEGATIVE
Protein, ur: NEGATIVE mg/dL
Specific Gravity, Urine: 1.015 (ref 1.005–1.030)
Urobilinogen, UA: 0.2 mg/dL (ref 0.0–1.0)
pH: 5.5 (ref 5.0–8.0)

## 2014-05-03 IMAGING — US US PELVIS COMPLETE
1 series · 13 of 25 positions shown · non-contrast
Comparison: Non contrast CT Abdomen and Pelvis from [RB] hr the
same day, reported separately.

CLINICAL DATA: 47-year-old female with right lower quadrant and
pelvic pain. Initial encounter. Prior hysterectomy.

EXAM:
TRANSABDOMINAL AND TRANSVAGINAL ULTRASOUND OF PELVIS
DOPPLER ULTRASOUND OF OVARIES
TECHNIQUE: Both transabdominal and transvaginal ultrasound examinations of the
pelvis were performed. Transabdominal technique was performed for
global imaging of the pelvis including uterus, ovaries, adnexal
regions, and pelvic cul-de-sac.
It was necessary to proceed with endovaginal exam following the
transabdominal exam to visualize the ovaries. Color and duplex
Doppler ultrasound was utilized to evaluate blood flow to the
ovaries.

[Series 1: us pelvis complete · 0.23mm/px · 13 of 46 slices shown]
[im 1/46]
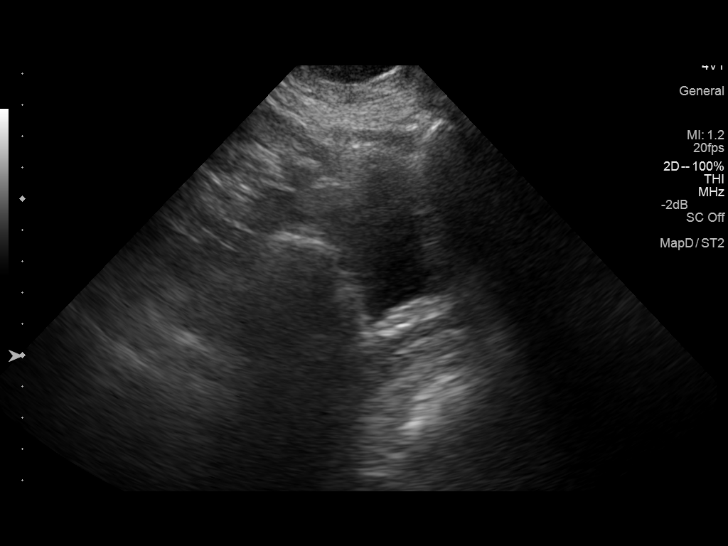
[im 4/46]
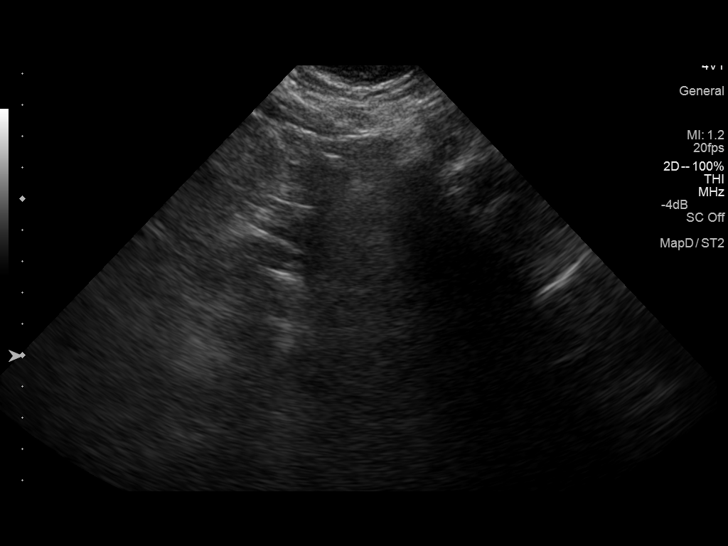
[im 8/46]
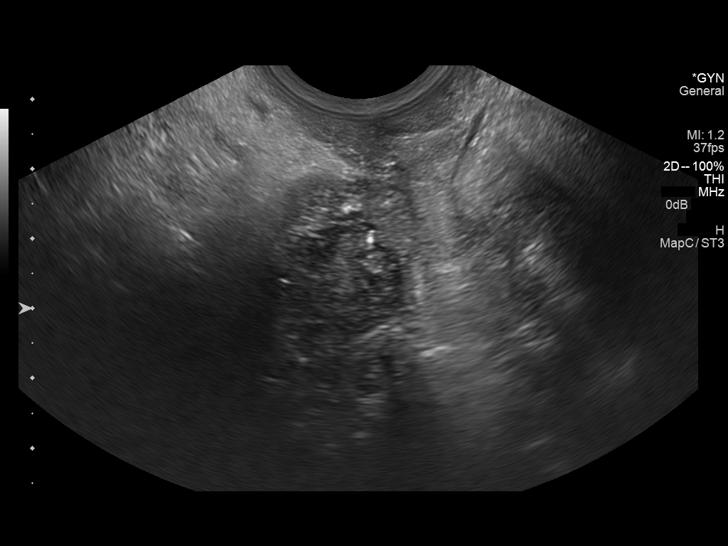
[im 12/46]
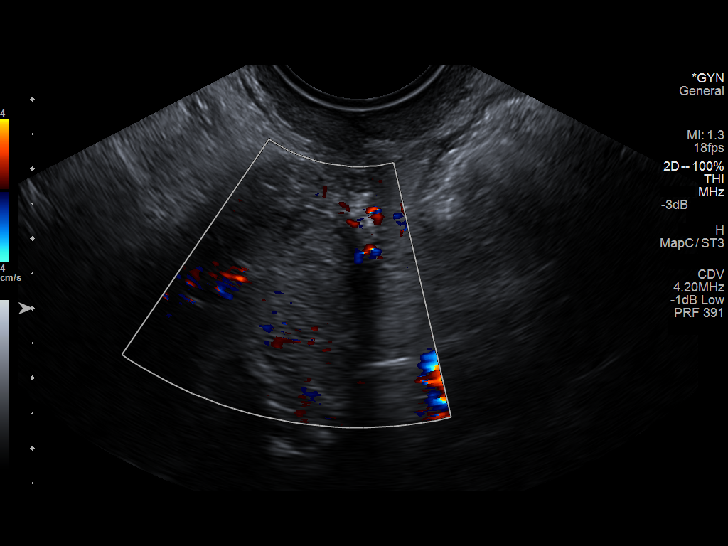
[im 16/46]
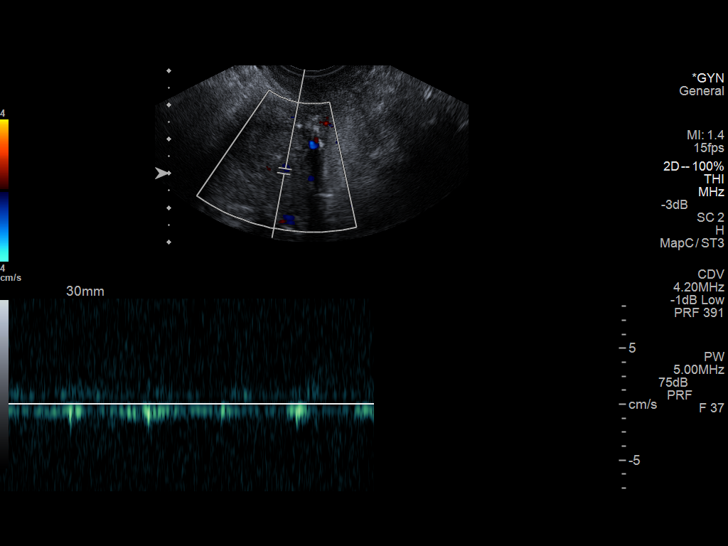
[im 19/46]
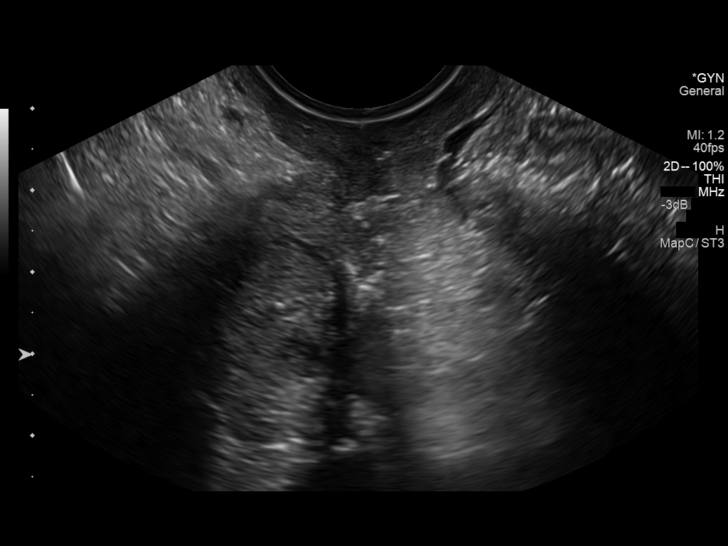
[im 23/46]
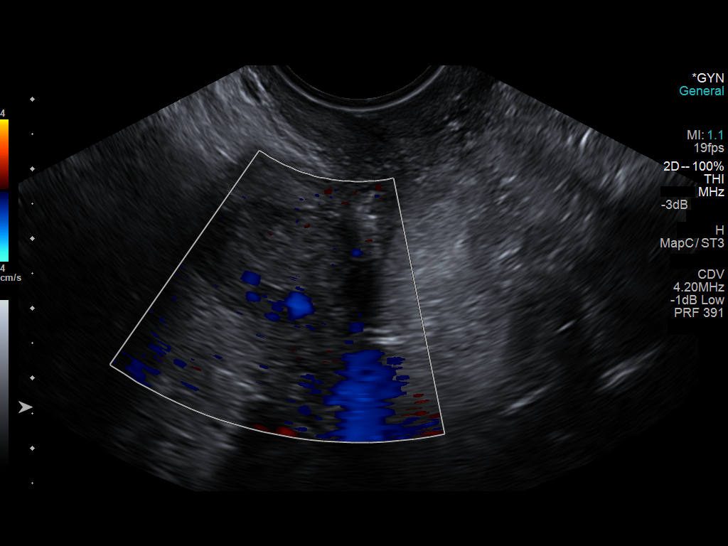
[im 27/46]
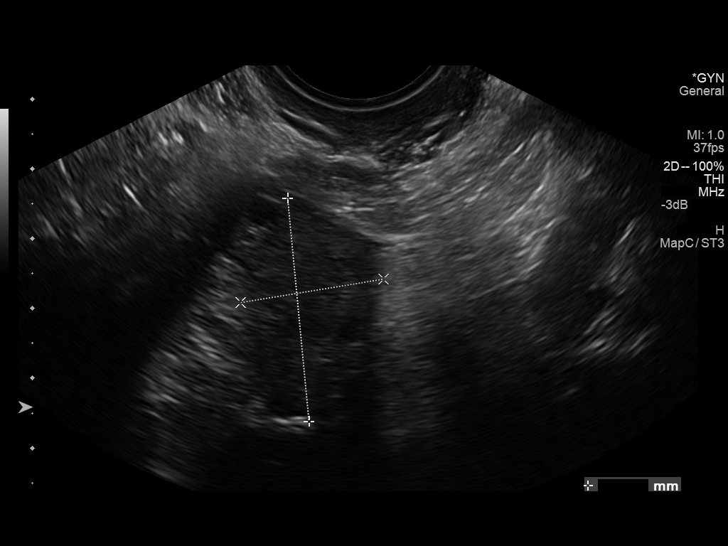
[im 31/46]
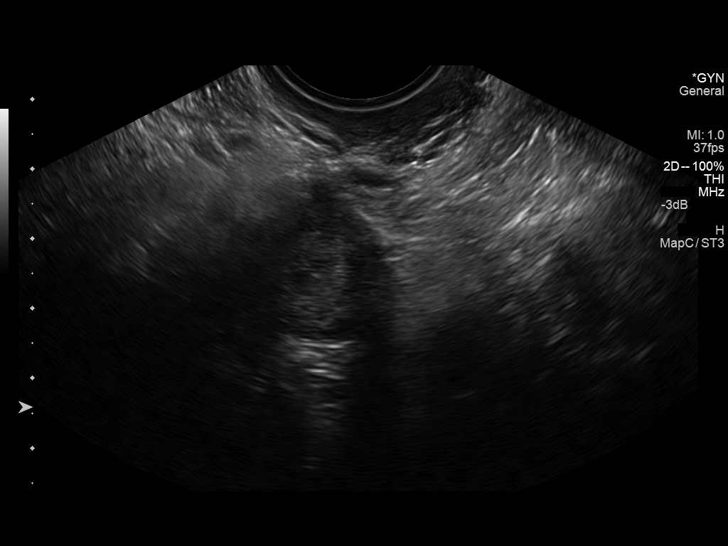
[im 34/46]
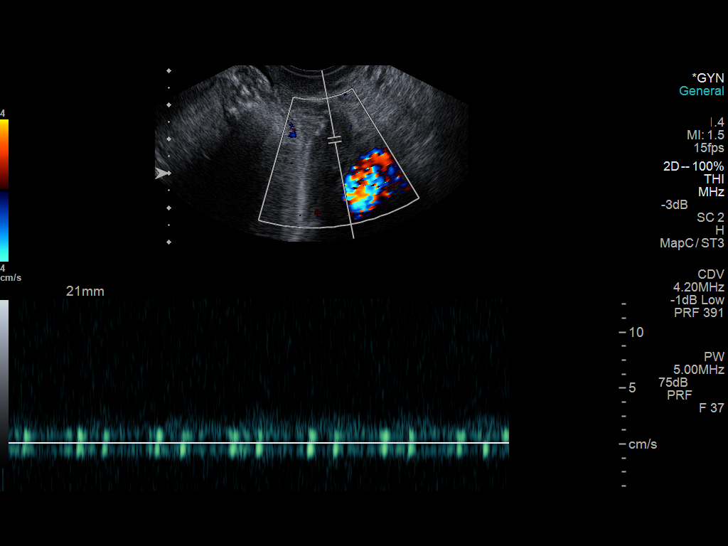
[im 38/46]
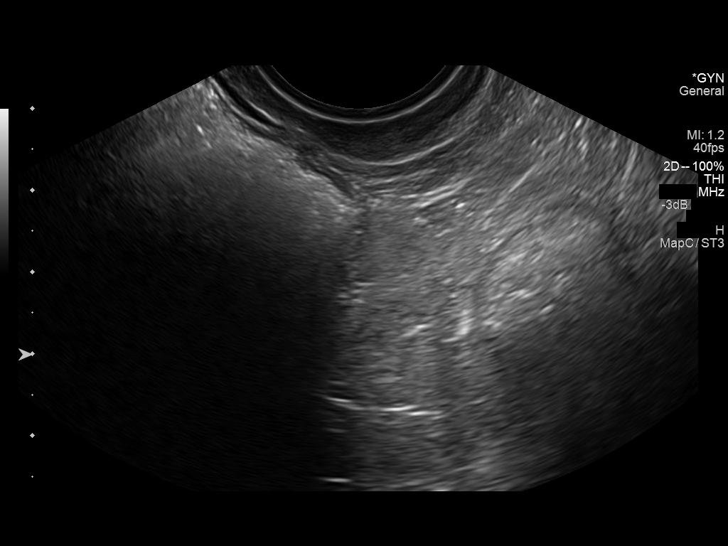
[im 42/46]
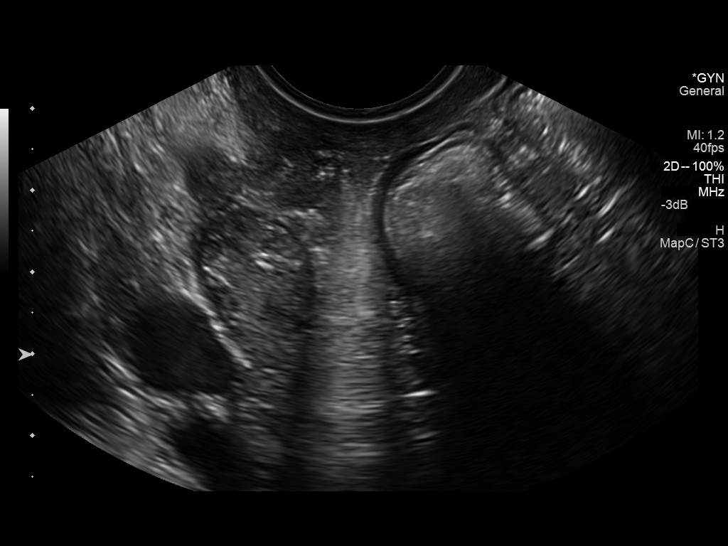
[im 46/46]
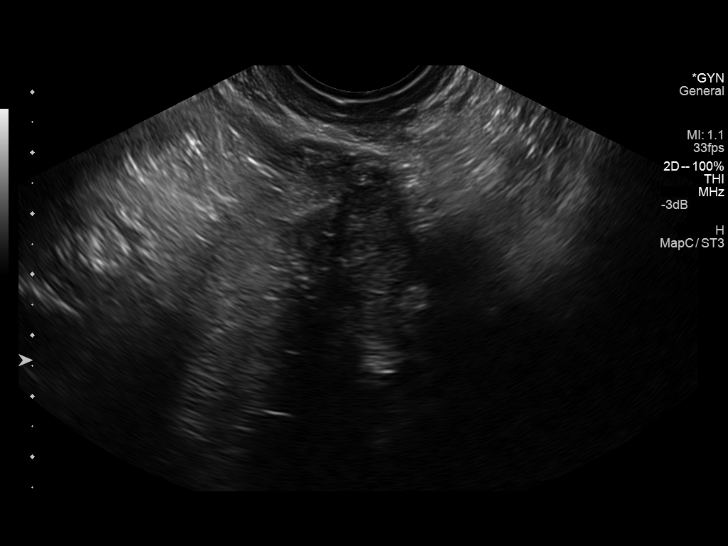

[13 of 25 positions shown; findings below may reference images not displayed]

FINDINGS: Uterus

Measurements: Surgically absent.

Endometrium

Thickness: Surgically absent.

Right ovary

Measurements: 3.4 x 2.4 x 1.8 cm. Normal appearance/no adnexal mass.

Left ovary

Measurements: 3.2 x 2.1 x 2.1 cm. Normal appearance/no adnexal mass.

Pulsed Doppler evaluation of both ovaries demonstrates normal
low-resistance arterial and venous waveforms.

Other findings

No free fluid.
IMPRESSION: Normal ovaries.  Uterus surgically absent.

## 2014-05-03 IMAGING — CT CT ABD-PELV W/O CM
2 of 4 series · 17 of 46 positions shown, 19 images · non-contrast
Comparison: Contrast-enhanced abdominal pelvic CT scan [DATE]

CLINICAL DATA: Recurrent lower abdominal pain ; history of diabetes
and ovarian cysts

EXAM:
CT ABDOMEN AND PELVIS WITHOUT CONTRAST
TECHNIQUE: Multidetector CT imaging of the abdomen and pelvis was performed
following the standard protocol without IV contrast.

[Series 2: standard/full over (age)lbs 5.0 · axial · 0.79mm/px · z∈[-560,-176]mm · 14 of 85 slices shown, 16 images]
[im 4/85  soft-tissue]
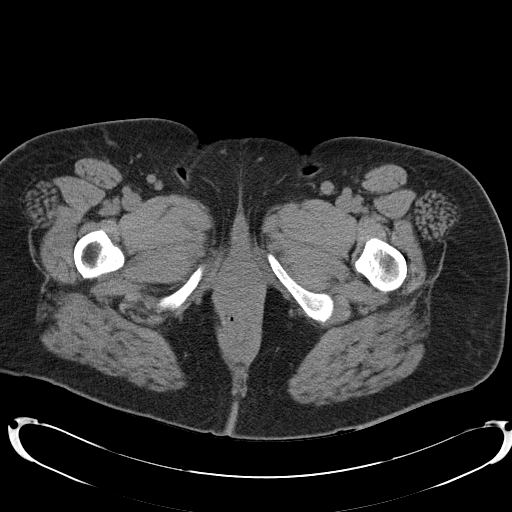
[im 4/85  bone]
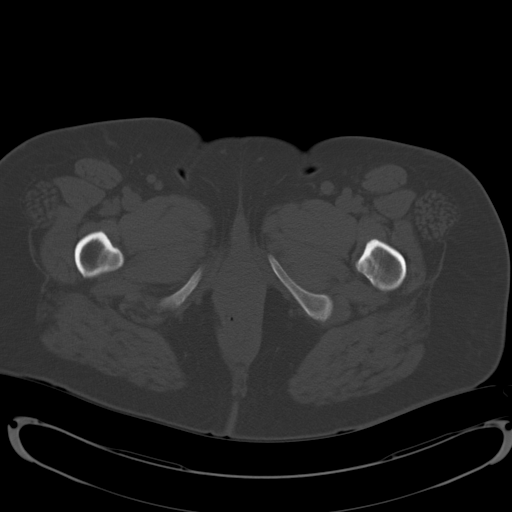
[im 11/85  soft-tissue]
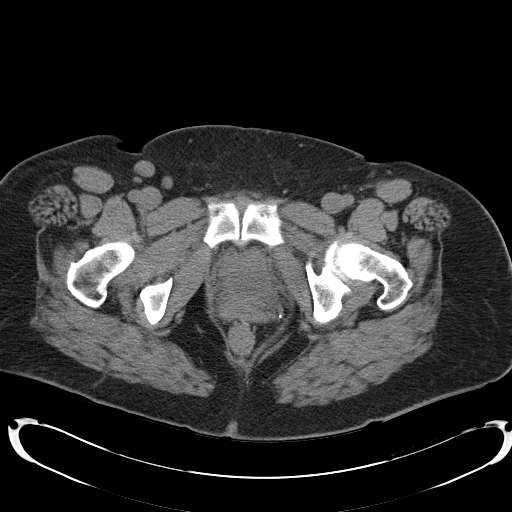
[im 18/85  soft-tissue]
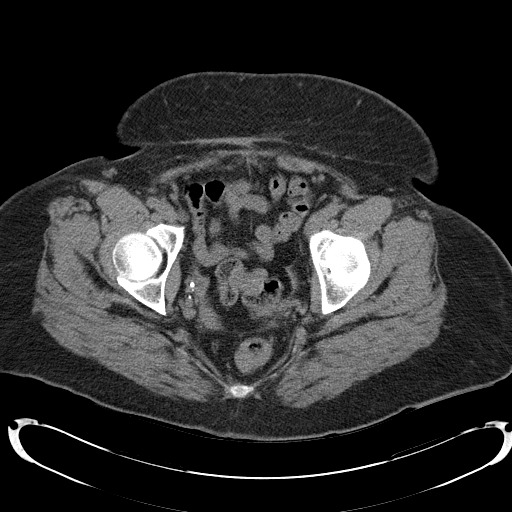
[im 22/85  soft-tissue]
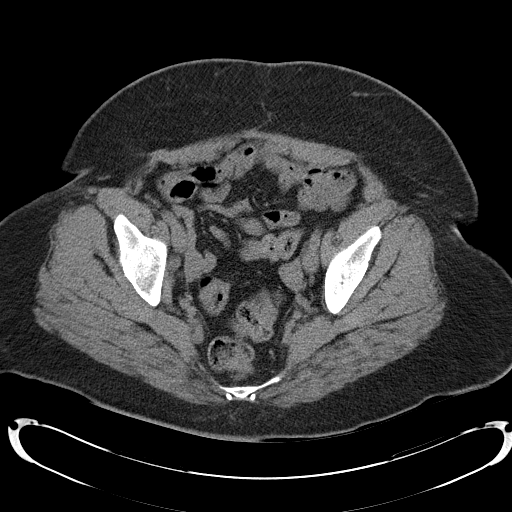
[im 29/85  soft-tissue]
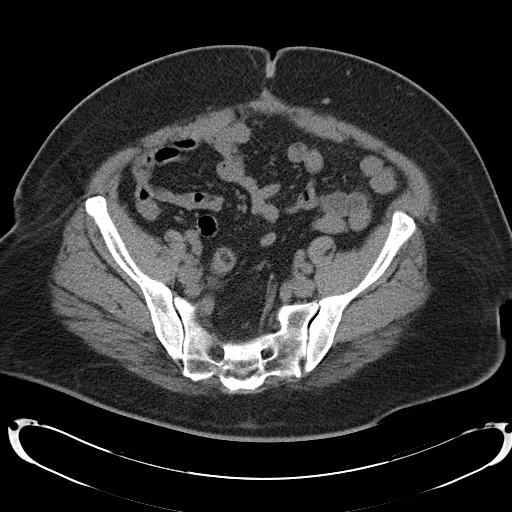
[im 36/85  soft-tissue]
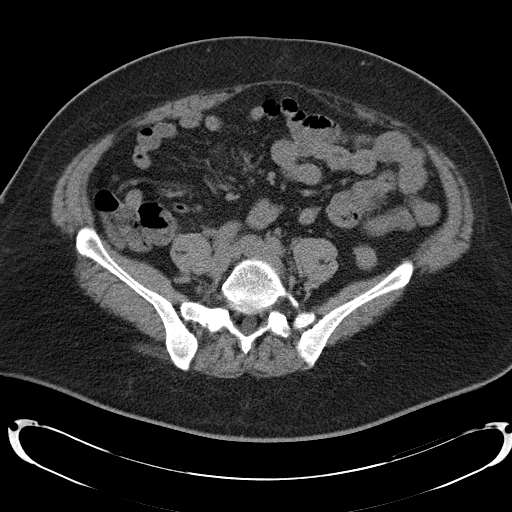
[im 39/85  soft-tissue]
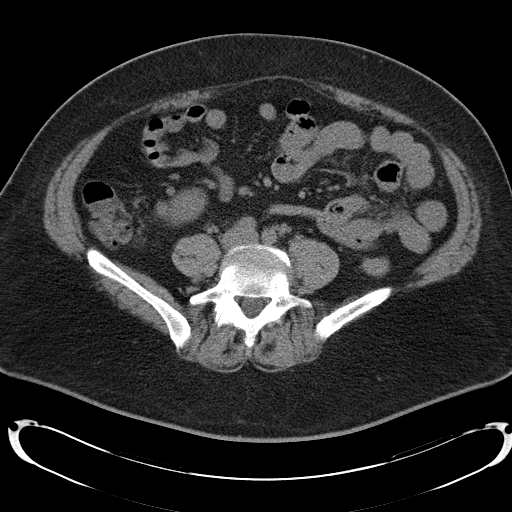
[im 46/85  soft-tissue]
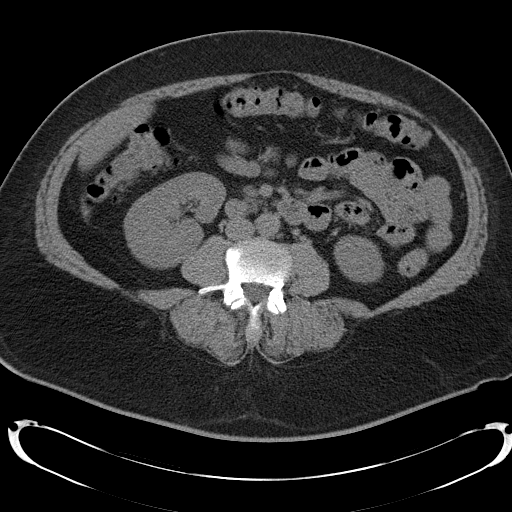
[im 50/85  soft-tissue]
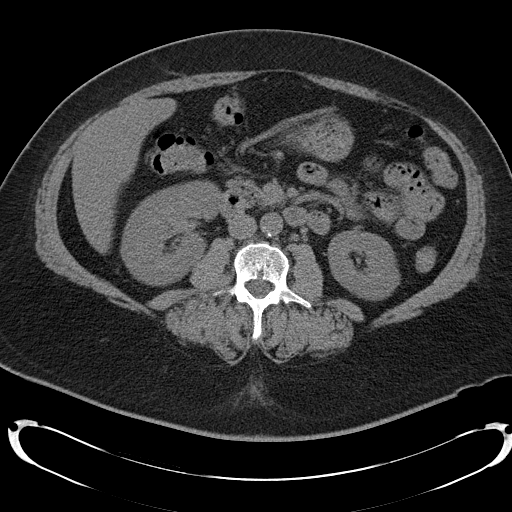
[im 50/85  bone]
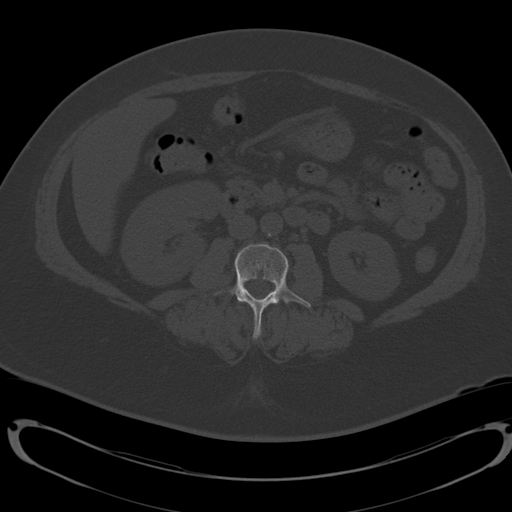
[im 57/85  soft-tissue]
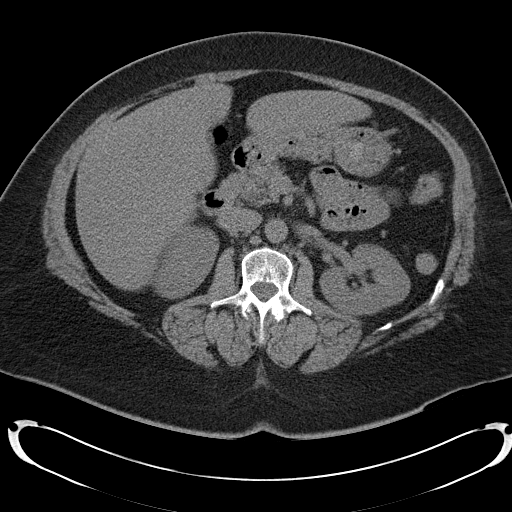
[im 64/85  soft-tissue]
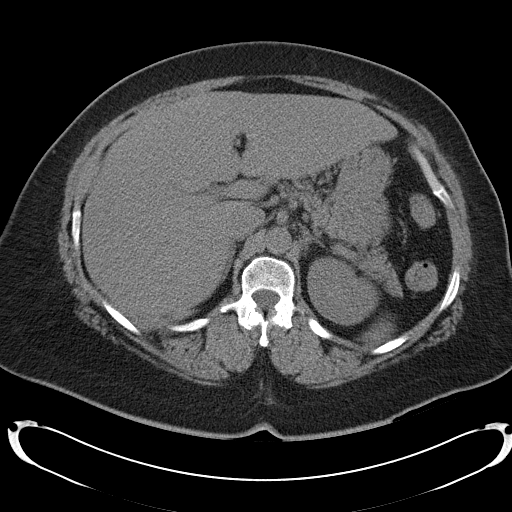
[im 67/85  soft-tissue]
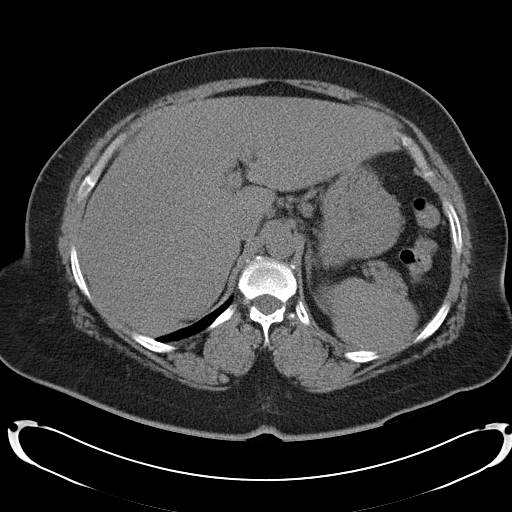
[im 74/85  soft-tissue]
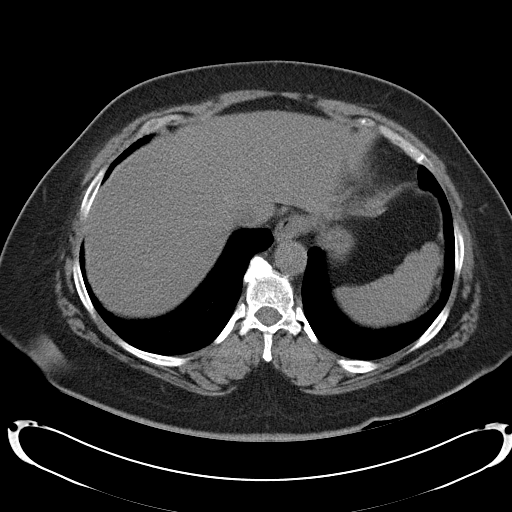
[im 81/85  soft-tissue]
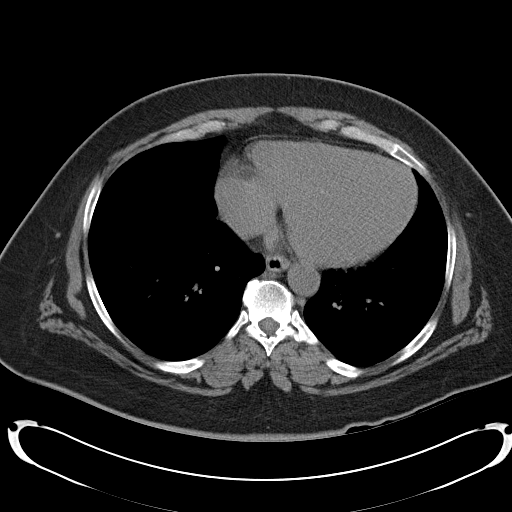

[Series 4: mpr coronal · coronal · 0.78mm/px · 3 of 102 slices shown]
[im 34/102  soft-tissue]
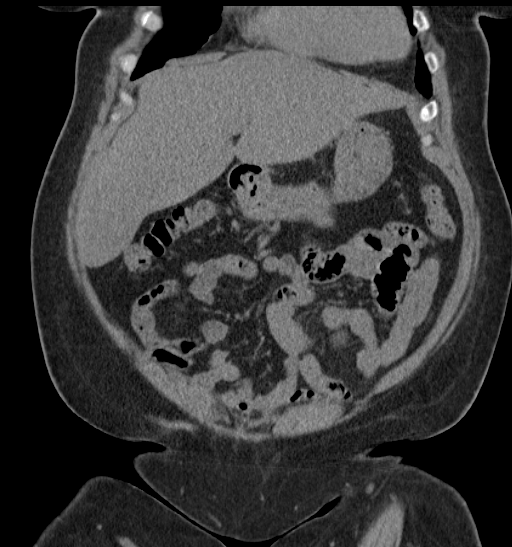
[im 45/102  soft-tissue]
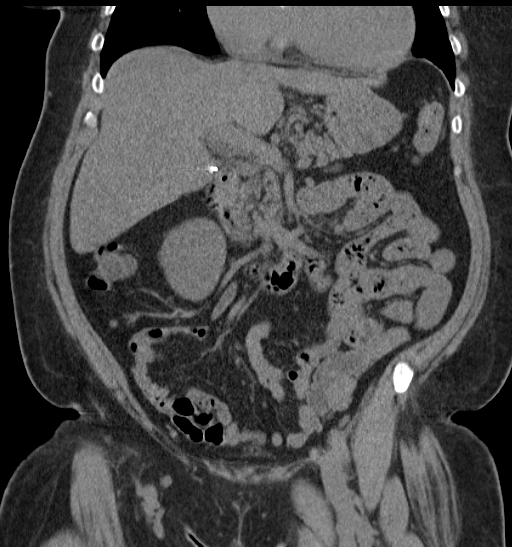
[im 57/102  soft-tissue]
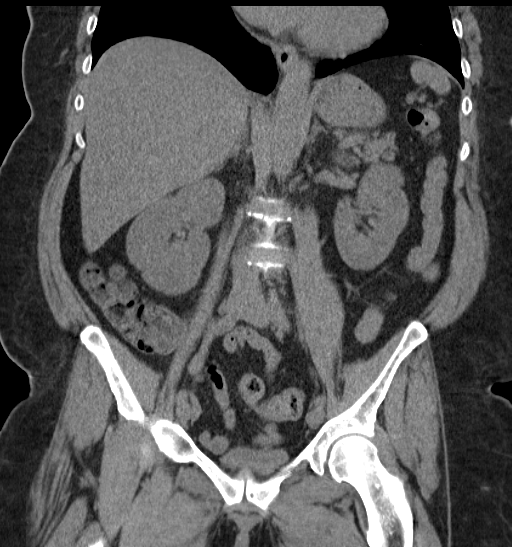

[17 of 46 positions shown; findings below may reference images not displayed]

FINDINGS: The kidneys exhibit normal density and contour. There is no
hydronephrosis nor hydroureter. There are no calcified stones. The
perinephric fat is normal in density. The urinary bladder is
unremarkable.

The gallbladder is surgically absent. The liver, pancreas, spleen,
nondistended stomach, adrenal glands, and abdominal aorta are
unremarkable. There is no intra abdominal nor pelvic
lymphadenopathy.

The small and large bowel exhibit no evidence of ileus, obstruction,
or acute inflammation. The appendix is normal.

The uterus is surgically absent. There are no adnexal masses nor is
there free pelvic fluid. There is no inguinal nor umbilical hernia.

The psoas musculature is normal. The lumbar spine pain and bony
pelvis exhibit no acute abnormalities. The lung bases are clear. The
cardiac chambers are enlarged where visualized.
IMPRESSION: 1. There is no acute urinary tract obstruction nor evidence of
calcified stones.
2. The right-sided ovarian cyst demonstrated on the previous CT scan
is no longer evident.
3. There is no acute hepatobiliary nor acute bowel abnormality.

## 2014-05-03 MED ORDER — HYDROCODONE-ACETAMINOPHEN 5-325 MG PO TABS: ORAL_TABLET | ORAL | Status: DC

## 2014-05-03 MED ORDER — OXYCODONE-ACETAMINOPHEN 5-325 MG PO TABS
2.0000 | ORAL_TABLET | Freq: Once | ORAL | Status: AC
  Administered 2014-05-03: 2 via ORAL
  Filled 2014-05-03: qty 2

## 2014-05-03 NOTE — ED Notes (Signed)
PT c/o recurrent lower abdominal pain with worsening today. PT denies any n/v and has one episode of diarrhea. PT states she was diagnosed with ovarian cyst the past time she was seen in ED.

## 2014-05-03 NOTE — ED Provider Notes (Signed)
CSN: [id number]     Arrival date & time 05/03/14  1246 History   First MD Initiated Contact with Patient 05/03/14 1315     Chief Complaint  Patient presents with  . Abdominal Pain     HPI Pt was seen at 1320.  Per pt, c/o gradual onset and persistence of constant right pelvic "pain" for the past 1 month, worse over the past several days. Pain worsens with palpation of the area and body position changes. Has been associated with one episode of diarrhea. Describes the pain as "the last time I was in the ER they told me I had an ovarian cyst."  Pt did not f/u with OB/GYN MD as instructed after that ED visit. Denies N/V, no fevers, no back pain, no rash, no CP/SOB, no black or blood in stools, no dysuria/hematuria, no vaginal bleeding/discharge.       Past Medical History  Diagnosis Date  . Type 2 diabetes mellitus   . Essential hypertension, benign   . GERD (gastroesophageal reflux disease)   . COPD (chronic obstructive pulmonary disease)   . Arthritis   . Normal coronary arteries     3/10 - following abnormal Myoview  . Diabetes mellitus   . CHF (congestive heart failure)    Past Surgical History  Procedure Laterality Date  . Tubal ligation    . Cholecystectomy    . Cesarean section    . Abdominal hysterectomy  09/10/2011    Procedure: HYSTERECTOMY ABDOMINAL;  Surgeon: Jonnie Kind, MD;  Location: [facility];  Service: Gynecology;  Laterality: N/A;  Abdominal hysterectomy  . Scar revision  09/10/2011    Procedure: SCAR REVISION;  Surgeon: Jonnie Kind, MD;  Location: [facility];  Service: Gynecology;  Laterality: N/A;  Wide Excision of old Cicatrix   Family History  Problem Relation Age of Onset  . Cirrhosis Mother   . Diabetes type II Father   . Diabetes type II Sister   . Anesthesia problems Neg Hx   . Hypotension Neg Hx   . Malignant hyperthermia Neg Hx   . Pseudochol deficiency Neg Hx    History  Substance Use Topics  . Smoking status: Current Every Day Smoker -- 0.25  packs/day for 29 years    Types: Cigarettes  . Smokeless tobacco: Never Used     Comment: 1 cigarette a day  . Alcohol Use: 0.0 oz/week     Comment: occ    Review of Systems ROS: Statement: All systems negative except as marked or noted in the HPI; Constitutional: Negative for fever and chills. ; ; Eyes: Negative for eye pain, redness and discharge. ; ; ENMT: Negative for ear pain, hoarseness, nasal congestion, sinus pressure and sore throat. ; ; Cardiovascular: Negative for chest pain, palpitations, diaphoresis, dyspnea and peripheral edema. ; ; Respiratory: Negative for cough, wheezing and stridor. ; ; Gastrointestinal: +abd pain, diarrhea x1 episode. Negative for nausea, vomiting, blood in stool, hematemesis, jaundice and rectal bleeding. ; ; GYN:  No vaginal bleeding, no vaginal discharge, no vulvar pain. ;; Genitourinary: Negative for dysuria, flank pain and hematuria. ; ; Musculoskeletal: Negative for back pain and neck pain. Negative for swelling and trauma.; ; Skin: Negative for pruritus, rash, abrasions, blisters, bruising and skin lesion.; ; Neuro: Negative for headache, lightheadedness and neck stiffness. Negative for weakness, altered level of consciousness , altered mental status, extremity weakness, paresthesias, involuntary movement, seizure and syncope.      Allergies  Bee venom and Naproxen  Home  Medications   Prior to Admission medications   Medication Sig Start Date End Date Taking? Authorizing Provider  aspirin EC 81 MG tablet Take 81 mg by mouth daily.   Yes Historical Provider, MD  enalapril (VASOTEC) 2.5 MG tablet Take 2.5 mg by mouth 2 (two) times daily.   Yes Historical Provider, MD  glimepiride (AMARYL) 2 MG tablet Take 2 mg by mouth 2 (two) times daily.   Yes Historical Provider, MD  hydrochlorothiazide (MICROZIDE) 12.5 MG capsule Take 12.5 mg by mouth 2 (two) times daily.   Yes Historical Provider, MD  lisinopril (PRINIVIL,ZESTRIL) 20 MG tablet Take 20 mg by mouth  daily.   Yes Historical Provider, MD  metFORMIN (GLUCOPHAGE) 1000 MG tablet Take 1,000 mg by mouth 2 (two) times daily with a meal.   Yes Historical Provider, MD  omeprazole (PRILOSEC) 20 MG capsule Take 20 mg by mouth daily.   Yes Historical Provider, MD  albuterol (PROVENTIL HFA;VENTOLIN HFA) 108 (90 BASE) MCG/ACT inhaler Inhale 1-2 puffs into the lungs every 6 (six) hours as needed for wheezing. 08/25/12   Nat Christen, MD  ibuprofen (ADVIL,MOTRIN) 200 MG tablet Take 400-800 mg by mouth every 6 (six) hours as needed for moderate pain.     Historical Provider, MD   BP 137/81  Pulse 80  Temp(Src) 97.8 F (36.6 C) (Oral)  Resp 20  Ht 5\' 5"  (1.651 m)  Wt 210 lb (95.255 kg)  BMI 34.95 kg/m2  SpO2 100%  LMP 08/21/2011 Physical Exam 1325: Physical examination:  Nursing notes reviewed; Vital signs and O2 SAT reviewed;  Constitutional: Well developed, Well nourished, Well hydrated, In no acute distress; Head:  Normocephalic, atraumatic; Eyes: EOMI, PERRL, No scleral icterus; ENMT: Mouth and pharynx normal, Mucous membranes moist; Neck: Supple, Full range of motion, No lymphadenopathy; Cardiovascular: Regular rate and rhythm, No gallop; Respiratory: Breath sounds clear & equal bilaterally, No wheezes.  Speaking full sentences with ease, Normal respiratory effort/excursion; Chest: Nontender, Movement normal; Abdomen: Soft, +RLQ tenderness to palp. No rebound or guarding. Nondistended, Normal bowel sounds; Genitourinary: No CVA tenderness. Pelvic exam performed with permission of pt and female ED tech assist during exam.  External genitalia w/o lesions. Vaginal vault with thick white discharge. Wet prep and GC/chlam and wet prep obtained and sent to lab.  Bimanual exam with right pelvic tenderness.;; Extremities: Pulses normal, No tenderness, No edema, No calf edema or asymmetry.; Neuro: AA&Ox3, Major CN grossly intact.  Speech clear. No gross focal motor or sensory deficits in extremities.; Skin: Color  normal, Warm, Dry.   ED Course  Procedures   MDM  MDM Reviewed: previous chart, nursing note and vitals Reviewed previous: labs, CT scan and ultrasound Interpretation: labs, ultrasound and CT scan   Results for orders placed during the hospital encounter of 05/03/14  WET PREP, GENITAL      Result Value Ref Range   Yeast Wet Prep HPF POC NONE SEEN  NONE SEEN   Trich, Wet Prep NONE SEEN  NONE SEEN   Clue Cells Wet Prep HPF POC NONE SEEN  NONE SEEN   WBC, Wet Prep HPF POC FEW (*) NONE SEEN  CBC WITH DIFFERENTIAL      Result Value Ref Range   WBC 7.4  4.0 - 10.5 K/uL   RBC 5.19 (*) 3.87 - 5.11 MIL/uL   Hemoglobin 14.9  12.0 - 15.0 g/dL   HCT 43.7  36.0 - 46.0 %   MCV 84.2  78.0 - 100.0 fL   MCH 28.7  26.0 - 34.0 pg   MCHC 34.1  30.0 - 36.0 g/dL   RDW 13.1  11.5 - 15.5 %   Platelets 224  150 - 400 K/uL   Neutrophils Relative % 46  43 - 77 %   Neutro Abs 3.4  1.7 - 7.7 K/uL   Lymphocytes Relative 44  12 - 46 %   Lymphs Abs 3.2  0.7 - 4.0 K/uL   Monocytes Relative 8  3 - 12 %   Monocytes Absolute 0.6  0.1 - 1.0 K/uL   Eosinophils Relative 2  0 - 5 %   Eosinophils Absolute 0.2  0.0 - 0.7 K/uL   Basophils Relative 0  0 - 1 %   Basophils Absolute 0.0  0.0 - 0.1 K/uL  BASIC METABOLIC PANEL      Result Value Ref Range   Sodium 136 (*) 137 - 147 mEq/L   Potassium 3.9  3.7 - 5.3 mEq/L   Chloride 100  96 - 112 mEq/L   CO2 22  19 - 32 mEq/L   Glucose, Bld 350 (*) 70 - 99 mg/dL   BUN 12  6 - 23 mg/dL   Creatinine, Ser 0.89  0.50 - 1.10 mg/dL   Calcium 9.0  8.4 - 10.5 mg/dL   GFR calc non Af Amer 76 (*) >90 mL/min   GFR calc Af Amer 88 (*) >90 mL/min   Anion gap 14  5 - 15  URINALYSIS, ROUTINE W REFLEX MICROSCOPIC      Result Value Ref Range   Color, Urine YELLOW  YELLOW   APPearance CLEAR  CLEAR   Specific Gravity, Urine 1.015  1.005 - 1.030   pH 5.5  5.0 - 8.0   Glucose, UA >1000 (*) NEGATIVE mg/dL   Hgb urine dipstick NEGATIVE  NEGATIVE   Bilirubin Urine NEGATIVE   NEGATIVE   Ketones, ur NEGATIVE  NEGATIVE mg/dL   Protein, ur NEGATIVE  NEGATIVE mg/dL   Urobilinogen, UA 0.2  0.0 - 1.0 mg/dL   Nitrite NEGATIVE  NEGATIVE   Leukocytes, UA NEGATIVE  NEGATIVE  URINE MICROSCOPIC-ADD ON      Result Value Ref Range   Squamous Epithelial / LPF MANY (*) RARE   WBC, UA 0-2  <3 WBC/hpf   Bacteria, UA FEW (*) RARE   Urine-Other YEAST     Ct Abdomen Pelvis Wo Contrast 05/03/2014   CLINICAL DATA:  Recurrent lower abdominal pain ; history of diabetes and ovarian cysts  EXAM: CT ABDOMEN AND PELVIS WITHOUT CONTRAST  TECHNIQUE: Multidetector CT imaging of the abdomen and pelvis was performed following the standard protocol without IV contrast.  COMPARISON:  Contrast-enhanced abdominal pelvic CT scan of October 12, 2013  FINDINGS: The kidneys exhibit normal density and contour. There is no hydronephrosis nor hydroureter. There are no calcified stones. The perinephric fat is normal in density. The urinary bladder is unremarkable.  The gallbladder is surgically absent. The liver, pancreas, spleen, nondistended stomach, adrenal glands, and abdominal aorta are unremarkable. There is no intra abdominal nor pelvic lymphadenopathy.  The small and large bowel exhibit no evidence of ileus, obstruction, or acute inflammation. The appendix is normal.  The uterus is surgically absent. There are no adnexal masses nor is there free pelvic fluid. There is no inguinal nor umbilical hernia.  The psoas musculature is normal. The lumbar spine pain and bony pelvis exhibit no acute abnormalities. The lung bases are clear. The cardiac chambers are enlarged where visualized.  IMPRESSION: 1. There is  no acute urinary tract obstruction nor evidence of calcified stones. 2. The right-sided ovarian cyst demonstrated on the previous CT scan is no longer evident. 3. There is no acute hepatobiliary nor acute bowel abnormality.   Electronically Signed   By: David  [place]   On: 05/03/2014 15:15   [place] Art/ven Flow Abd  Pelv Doppler 05/03/2014   CLINICAL DATA:  48 year old female with right lower quadrant and pelvic pain. Initial encounter. Prior hysterectomy.  EXAM: TRANSABDOMINAL AND TRANSVAGINAL ULTRASOUND OF PELVIS  DOPPLER ULTRASOUND OF OVARIES  TECHNIQUE: Both transabdominal and transvaginal ultrasound examinations of the pelvis were performed. Transabdominal technique was performed for global imaging of the pelvis including uterus, ovaries, adnexal regions, and pelvic cul-de-sac.  It was necessary to proceed with endovaginal exam following the transabdominal exam to visualize the ovaries. Color and duplex Doppler ultrasound was utilized to evaluate blood flow to the ovaries.  COMPARISON:  Non contrast CT Abdomen and Pelvis from 1508 hr the same day, reported separately.  FINDINGS: Uterus  Measurements: Surgically absent.  Endometrium  Thickness: Surgically absent.  Right ovary  Measurements: 3.4 x 2.4 x 1.8 cm. Normal appearance/no adnexal mass.  Left ovary  Measurements: 3.2 x 2.1 x 2.1 cm. Normal appearance/no adnexal mass.  Pulsed Doppler evaluation of both ovaries demonstrates normal low-resistance arterial and venous waveforms.  Other findings  No free fluid.  IMPRESSION: Normal ovaries.  Uterus surgically absent.   Electronically Signed   By: Lars Pinks M.D.   On: 05/03/2014 15:15    1645:  Workup reassuring. Will tx symptomatically at this time. Dx and testing d/w pt.  Questions answered.  Verb understanding, agreeable to d/c home with outpt f/u.   Francine Graven, DO 05/04/14 1901

## 2014-05-03 NOTE — Discharge Instructions (Signed)
°Emergency Department Resource Guide °1) Find a Doctor and Pay Out of Pocket °Although you won't have to find out who is covered by your insurance plan, it is a good idea to ask around and get recommendations. You will then need to call the office and see if the doctor you have chosen will accept you as a new patient and what types of options they offer for patients who are self-pay. Some doctors offer discounts or will set up payment plans for their patients who do not have insurance, but you will need to ask so you aren't surprised when you get to your appointment. ° °2) Contact Your Local Health Department °Not all health departments have doctors that can see patients for sick visits, but many do, so it is worth a call to see if yours does. If you don't know where your local health department is, you can check in your phone book. The CDC also has a tool to help you locate your state's health department, and many state websites also have listings of all of their local health departments. ° °3) Find a Walk-in Clinic °If your illness is not likely to be very severe or complicated, you may want to try a walk in clinic. These are popping up all over the country in pharmacies, drugstores, and shopping centers. They're usually staffed by nurse practitioners or physician assistants that have been trained to treat common illnesses and complaints. They're usually fairly quick and inexpensive. However, if you have serious medical issues or chronic medical problems, these are probably not your best option. ° °No Primary Care Doctor: °- Call [facility] at  832-8000 - they can help you locate a primary care doctor that  accepts your insurance, provides certain services, etc. °- Physician Referral Service- 1-800-533-3463 ° °Chronic Pain Problems: °Organization         Address  Phone   Notes  °[facility]  (336) 297-2271 Patients need to be referred by their primary care doctor.  ° °Medication  Assistance: °Organization         Address  Phone   Notes  °[place] [facility] [address] °[place], [place] [address] (336) 641-8030 --Must be a resident of [place] °-- Must have NO insurance coverage whatsoever (no Medicaid/ Medicare, etc.) °-- The pt. MUST have a primary care doctor that directs their care regularly and follows them in the community °  °[facility]  (866) 331-1348   °[employer]  (888) 892-1162   ° °Agencies that provide inexpensive medical care: °Organization         Address  Phone   Notes  °[facility]  (336) 832-8035   ° Internal Medicine    (336) 832-7272   °[facility] [address] °[place], [place] [address] (336) 832-4777   °[facility] [address], °[place] (336) 271-4999   °[facility]    (336) 373-0678   °[facility]    (336) 272-1050   °[facility] ° [address], [place] Phone:  (336) 832-4444, Fax:  (336) 832-4440 Hours of Operation:  9 am - 6 pm, M-F.  Also accepts Medicaid/Medicare and self-pay.  °[facility] ° [address], [place] Phone: (336) 832-3150, Fax: (336) 832-3151. Hours of Operation:  8:30 am - 5:30 pm, M-F.  Also accepts Medicaid and self-pay.  °[facility] [place] [address], [place] Phone: (336) 878-6027   °[facility] [address], [place], [place] (336)723-1848, Ext. 123 Mondays & Thursdays: 7-9 AM.  First 15 patients are seen on a first come, first serve basis. °  ° °Medicaid-accepting [place] Providers: ° °Organization         Address  Phone   Notes  °[facility] [address], [place] (336) 641-2100 Also accepts self-pay patients.  °[facility] [address], [place] ° (336) 856-9996   °[facility] [address], [place]  (336) 288-8857   °[facility] [address] [place] [address], [place] (336) 299-7000   °Veita Bland [address], [place]  ° (336) 373-1557 Only accepts [place] Access Medicaid patients after they have their name applied to their card.  ° °Self-Pay (no insurance) in [place]: ° °Organization         Address  Phone   Notes  °Sickle Cell Patients, [facility] [address], [place] (336) 832-1970   °[facility] [address], [place] (336) 832-4400   °[facility] ° [address], [place] (336) 992-4800   °[facility]/Dr. Osei-Bonsu ° [address] [place] [address], [place] or [address], [place] (336) [phone] Phone number for both [place] and [place] locations is the same.  °[facility] [address], [place] (336) 299-0000   °[facility] [address] [place] [address], [place] or [address] (336) 852-7530 °(336) 878-2260   °[facility] [address], [place] (336) 350-1642, phone; (336) 294-5005, fax Sees patients 1st and 3rd Saturday of every month.  Must not qualify for public or private insurance (i.e. Medicaid, Medicare, [place] Health Choice, Veterans' Benefits) • Household income should be no more than 200% of the poverty level •The clinic cannot treat you if you are pregnant or think you are pregnant • Sexually transmitted diseases are not treated at the clinic.  ° ° °Dental Care: °Organization         Address  Phone  Notes  °[place] [facility] [address], [place] (336) 641-6152 Accepts children up to age 21 who are enrolled in Medicaid or [place] Health Choice; pregnant women with a Medicaid card; and children who have applied for Medicaid or [place] Health Choice, but were declined, whose parents can pay a reduced fee at time of service.  °[place]  [facility] [place]  [address], [place] (336) 641-7733 Accepts children up to age 21 who are enrolled in Medicaid or [place] Health Choice; pregnant women with a Medicaid card; and children who have applied for Medicaid or [place] Health Choice, but were declined, whose parents can pay a reduced fee at time of service.  °[facility] ° [address], [place] (336) 641-4533 Patients are seen by appointment only. Walk-ins are not accepted. [facility] will see patients 18 years of age and older. °Monday - Tuesday (8am-5pm) °Most Wednesdays (8:30-5pm) °$30 per visit, cash only  °[facility] ° [address], [place] (336) 641-4533 Patients are seen by appointment only. Walk-ins are not accepted. [facility] will see patients 18 years of age and older. °One   Wednesday Evening (Monthly: Volunteer Based).  $30 per visit, cash only  °[facility]  (919) 537-3737 for adults; Children under age 4, call Graduate Pediatric Dentistry at ([phone]) [phone]. Children aged 4-14, please call ([phone]) [phone] to request a pediatric application. ° Dental services are provided in all areas of dental care including fillings, crowns and bridges, complete and partial dentures, implants, gum treatment, root canals, and extractions. Preventive care is also provided. Treatment is provided to both adults and children. °Patients are selected via a lottery and there is often a waiting list. °  °[facility] [address], °[place] ° (336) 763-8833 [email or web address] °  °[facility] [address], [place], [place] (336)723-1848, Ext. 123 Second and Fourth Thursday of each month, opens at 6:30 AM; Clinic ends at 9 AM.  Patients are seen on a first-come first-served basis, and a limited number are seen during each clinic.  ° °[facility] ° [address], [place], [place] (336) 723-7904    Eligibility Requirements °You must have lived in [place], [place], or [place] counties for at least the last three months. °  You cannot be eligible for state or federal sponsored healthcare insurance, including Veterans Administration, Medicaid, or Medicare. °  You generally cannot be eligible for healthcare insurance through your employer.  °  How to apply: °Eligibility screenings are held every Tuesday and Wednesday afternoon from 1:00 pm until 4:00 pm. You do not need an appointment for the interview!  °[facility] [address], [place], [place] 336-631-2330   °[facility]  336-342-8273   °[facility]  336-703-3100   °[facility]  336-570-6415   ° °Behavioral Health Resources in the Community: °Intensive Outpatient Programs °Organization         Address  Phone  Notes  °[facility] [address], [place], [place] 336-878-6098   °[facility] [address], [place], [place] 336-832-9800   °[facility] [address], [place], [place] ° 336-882-2125   °[facility] [address],  °[place], [place] 1-800-853-5163 or 336-641-4981   °Substance Abuse Resources °Organization         Address  Phone  Notes  °[facility]  336-882-2125   °[facility]  336-784-9470   °[facility]  336-285-9073   °[facility]  336-845-3988   °Residential & Outpatient Substance Abuse Program  1-800-659-3381   °Psychological Services °Organization         Address  Phone  Notes  ° Health  336- 832-9600   °[facility]  336- 378-7881   °[facility] [address], [place] 1-800-853-5163 or 336-641-4981   ° °Mobile Crisis Teams °Organization         Address  Phone  Notes  °[facility], Mobile Crisis Care Unit  1-877-626-1772   °Assertive °[facility] ° [address]  [place], [place] 336-834-9664   °Sharon DeEsch [address] °[place] 336-554-5454   ° °Self-Help/Support Groups °Organization         Address  Phone             Notes  °[facility]  - variety of support groups  336- 373-1402 Call for more information  °Narcotics Anonymous (NA), [facility] [address], °[place]  2 meetings at this location  ° °  Residential Treatment Programs Organization         Address  Phone  Notes  [facility] [address]    [place]  1-856-715-4044   [facility]  [address], [place] [address], [place], [place]   [place], [place] 281-446-3749 Admissions: 8am-3pm M-F  [facility] [place] [address] [place] [address],    [place], [place] 657-846-9629   [facility] [address] [place], [place], [place]   [facility] [address]  [place], [place]   [facility] Intensive Outpatient [address], [place], [place]   [facility] ([place] [address]  [place], [place] 1-938-415-4012 or 414-481-0840   [facility] [address], [place], [place] Accepts Medicaid  [facility] [address]  [place] 1-581-095-7572 Substance Abuse/Addiction Treatment   [facility]         Address  Phone  Notes  [facility]  (431)303-0059   Domenic Schwab, PhD [address] [place], [place]   (226)594-6516 or 682-549-9310   [facility] [place], [place] (279) [phone]   [facility] [address] [place], [place] [phone] Insurance/Medicaid/sponsorship through [facility] [address] [place]                                    [place], [place] [phone] [facility] [address], [place] [phone]    Dr. Adele Schilder  [phone]   [facility] Dept. 1) [address] [place] 2) [place] 3)  [address], [place] [phone]  [phone]   [place] (601)[phone] or [phone] (After Hours)       Take the prescription as directed.  Apply moist heat or ice to the area(s) of discomfort, for 15 minutes at a time, several times per day for the next few days.  Do not fall asleep on a heating or ice pack.  Call your regular medical doctor and your OB/GYN doctor tomorrow to schedule a follow up appointment this week.  Return to the Emergency Department immediately if worsening.

## 2014-05-04 LAB — GC/CHLAMYDIA PROBE AMP
CT Probe RNA: NEGATIVE
GC Probe RNA: NEGATIVE

## 2014-08-05 DIAGNOSIS — M109 Gout, unspecified: Secondary | ICD-10-CM

## 2014-08-05 HISTORY — DX: Gout, unspecified: M10.9

## 2014-08-16 ENCOUNTER — Emergency Department (HOSPITAL_COMMUNITY)
Admission: EM | Admit: 2014-08-16 | Discharge: 2014-08-16 | Disposition: A | Discharge status: Home / Self Care | Payer: Self-pay | Attending: Emergency Medicine | Admitting: Emergency Medicine

## 2014-08-16 ENCOUNTER — Emergency Department (HOSPITAL_COMMUNITY): Payer: Self-pay

## 2014-08-16 ENCOUNTER — Encounter (HOSPITAL_COMMUNITY): Payer: Self-pay | Admitting: *Deleted

## 2014-08-16 DIAGNOSIS — E119 Type 2 diabetes mellitus without complications: Secondary | ICD-10-CM | POA: Insufficient documentation

## 2014-08-16 DIAGNOSIS — K219 Gastro-esophageal reflux disease without esophagitis: Secondary | ICD-10-CM | POA: Insufficient documentation

## 2014-08-16 DIAGNOSIS — R109 Unspecified abdominal pain: Secondary | ICD-10-CM

## 2014-08-16 DIAGNOSIS — G8929 Other chronic pain: Secondary | ICD-10-CM | POA: Insufficient documentation

## 2014-08-16 DIAGNOSIS — I10 Essential (primary) hypertension: Secondary | ICD-10-CM | POA: Insufficient documentation

## 2014-08-16 DIAGNOSIS — Z9889 Other specified postprocedural states: Secondary | ICD-10-CM | POA: Insufficient documentation

## 2014-08-16 DIAGNOSIS — Z79899 Other long term (current) drug therapy: Secondary | ICD-10-CM | POA: Insufficient documentation

## 2014-08-16 DIAGNOSIS — M1990 Unspecified osteoarthritis, unspecified site: Secondary | ICD-10-CM | POA: Insufficient documentation

## 2014-08-16 DIAGNOSIS — Z9851 Tubal ligation status: Secondary | ICD-10-CM | POA: Insufficient documentation

## 2014-08-16 DIAGNOSIS — Z9049 Acquired absence of other specified parts of digestive tract: Secondary | ICD-10-CM | POA: Insufficient documentation

## 2014-08-16 DIAGNOSIS — R1013 Epigastric pain: Secondary | ICD-10-CM | POA: Insufficient documentation

## 2014-08-16 DIAGNOSIS — Z90710 Acquired absence of both cervix and uterus: Secondary | ICD-10-CM | POA: Insufficient documentation

## 2014-08-16 DIAGNOSIS — Z720 Tobacco use: Secondary | ICD-10-CM | POA: Insufficient documentation

## 2014-08-16 DIAGNOSIS — Z760 Encounter for issue of repeat prescription: Secondary | ICD-10-CM | POA: Insufficient documentation

## 2014-08-16 DIAGNOSIS — I509 Heart failure, unspecified: Secondary | ICD-10-CM | POA: Insufficient documentation

## 2014-08-16 DIAGNOSIS — J45909 Unspecified asthma, uncomplicated: Secondary | ICD-10-CM | POA: Insufficient documentation

## 2014-08-16 HISTORY — DX: Other chronic pain: G89.29

## 2014-08-16 HISTORY — DX: Unspecified abdominal pain: R10.9

## 2014-08-16 LAB — URINALYSIS, ROUTINE W REFLEX MICROSCOPIC
Bilirubin Urine: NEGATIVE
Glucose, UA: >1000 mg/dL — AB
Leukocytes, UA: NEGATIVE
Nitrite: NEGATIVE
Protein, ur: NEGATIVE mg/dL
Specific Gravity, Urine: 1.01 (ref 1.005–1.030)
Urobilinogen, UA: 0.2 mg/dL (ref 0.0–1.0)
pH: 5.5 (ref 5.0–8.0)

## 2014-08-16 LAB — CBC WITH DIFFERENTIAL/PLATELET
Basophils Absolute: 0 10*3/uL (ref 0.0–0.1)
Basophils Relative: 0 % (ref 0–1)
Eosinophils Absolute: 0.1 10*3/uL (ref 0.0–0.7)
Eosinophils Relative: 1 % (ref 0–5)
HCT: 46.6 % — ABNORMAL HIGH (ref 36.0–46.0)
Hemoglobin: 15.8 g/dL — ABNORMAL HIGH (ref 12.0–15.0)
Lymphocytes Relative: 42 % (ref 12–46)
Lymphs Abs: 3.7 10*3/uL (ref 0.7–4.0)
MCH: 29.2 pg (ref 26.0–34.0)
MCHC: 33.9 g/dL (ref 30.0–36.0)
MCV: 86.1 fL (ref 78.0–100.0)
Monocytes Absolute: 0.9 10*3/uL (ref 0.1–1.0)
Monocytes Relative: 10 % (ref 3–12)
Neutro Abs: 4.2 10*3/uL (ref 1.7–7.7)
Neutrophils Relative %: 47 % (ref 43–77)
Platelets: 261 10*3/uL (ref 150–400)
RBC: 5.41 MIL/uL — ABNORMAL HIGH (ref 3.87–5.11)
RDW: 13 % (ref 11.5–15.5)
WBC: 8.9 10*3/uL (ref 4.0–10.5)

## 2014-08-16 LAB — COMPREHENSIVE METABOLIC PANEL
ALT: 16 U/L (ref 0–35)
AST: 13 U/L (ref 0–37)
Albumin: 4 g/dL (ref 3.5–5.2)
Alkaline Phosphatase: 105 U/L (ref 39–117)
Anion gap: 9 (ref 5–15)
BUN: 10 mg/dL (ref 6–23)
CO2: 25 mmol/L (ref 19–32)
Calcium: 9 mg/dL (ref 8.4–10.5)
Chloride: 100 mEq/L (ref 96–112)
Creatinine, Ser: 0.71 mg/dL (ref 0.50–1.10)
GFR calc Af Amer: >90 mL/min (ref >90)
GFR calc non Af Amer: >90 mL/min (ref >90)
Glucose, Bld: 270 mg/dL — ABNORMAL HIGH (ref 70–99)
Potassium: 3.6 mmol/L (ref 3.5–5.1)
Sodium: 134 mmol/L — ABNORMAL LOW (ref 135–145)
Total Bilirubin: 0.9 mg/dL (ref 0.3–1.2)
Total Protein: 7.4 g/dL (ref 6.0–8.3)

## 2014-08-16 LAB — URINE MICROSCOPIC-ADD ON

## 2014-08-16 LAB — LIPASE, BLOOD: Lipase: 18 U/L (ref 11–59)

## 2014-08-16 IMAGING — DX DG ABDOMEN 2V
2 series · 2 of 2 positions shown · non-contrast
Comparison: CT abdomen pelvis dated [DATE]

CLINICAL DATA: Abdominal distention, hernia

EXAM:
ABDOMEN - 2 VIEW

[abdomen erect]
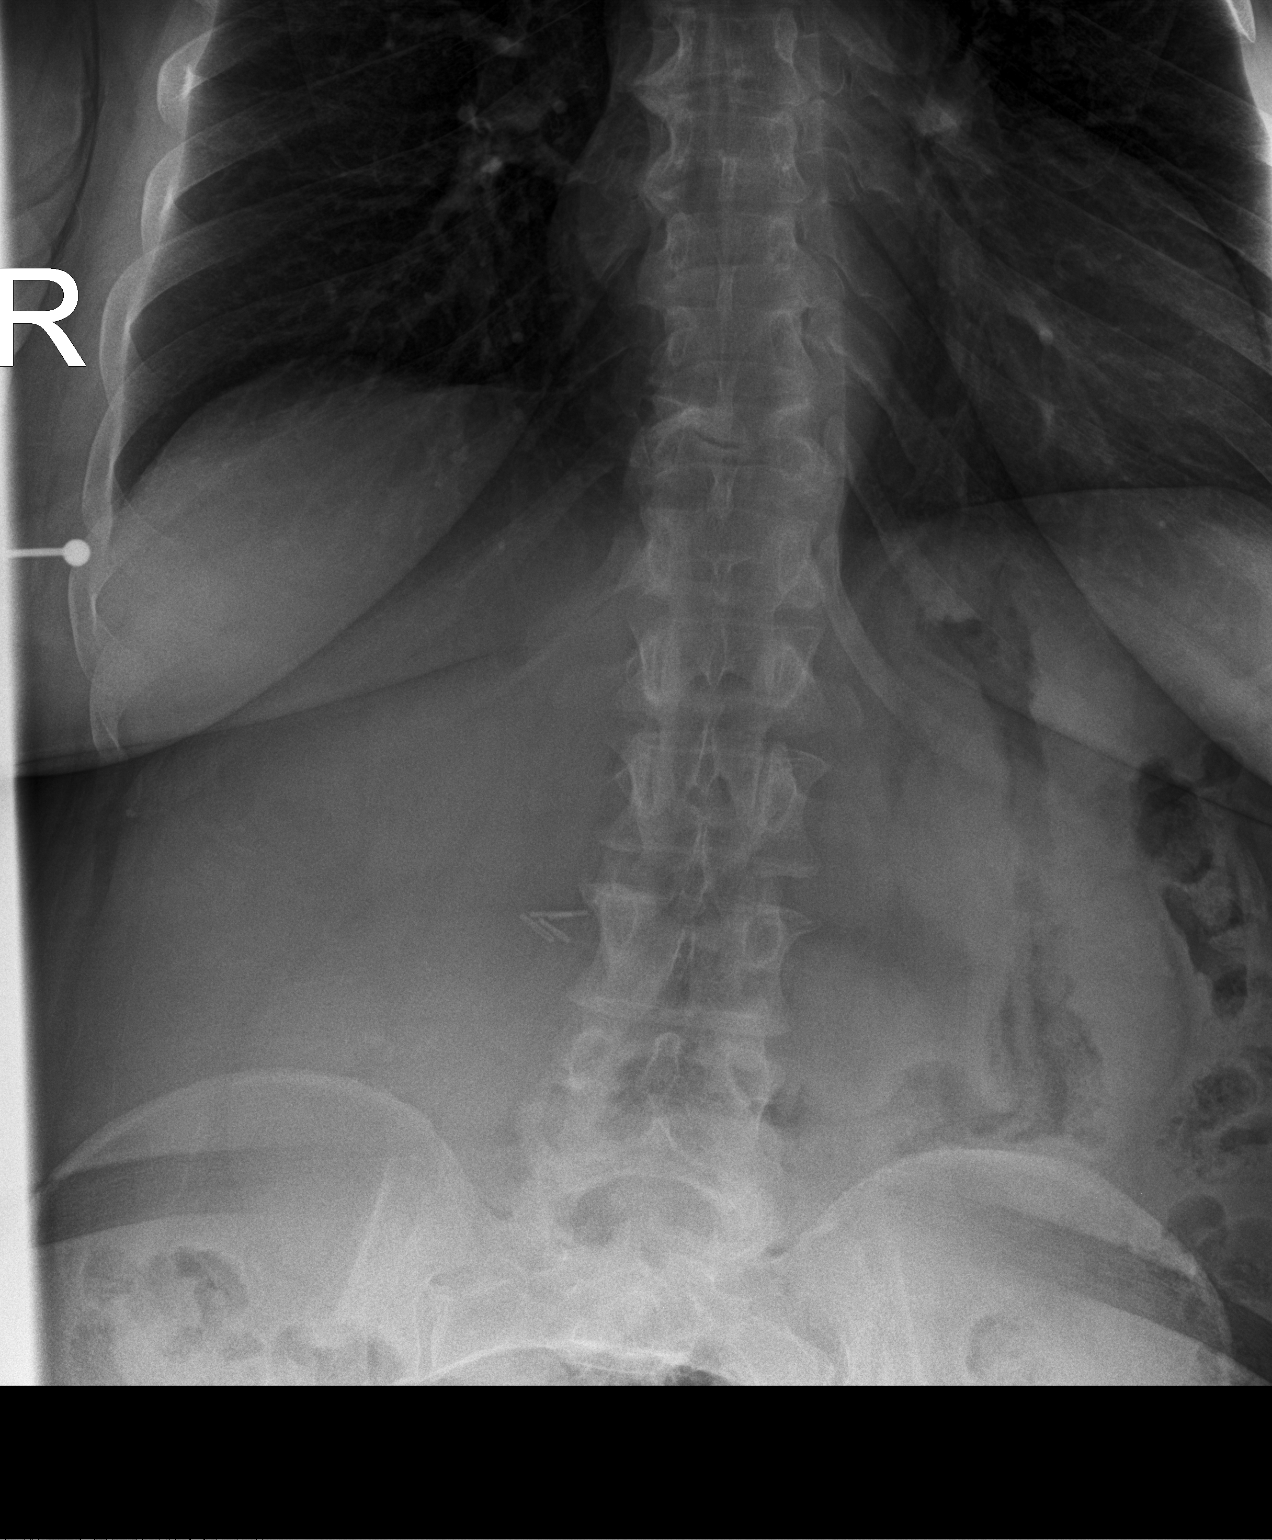

[abdomen supine]
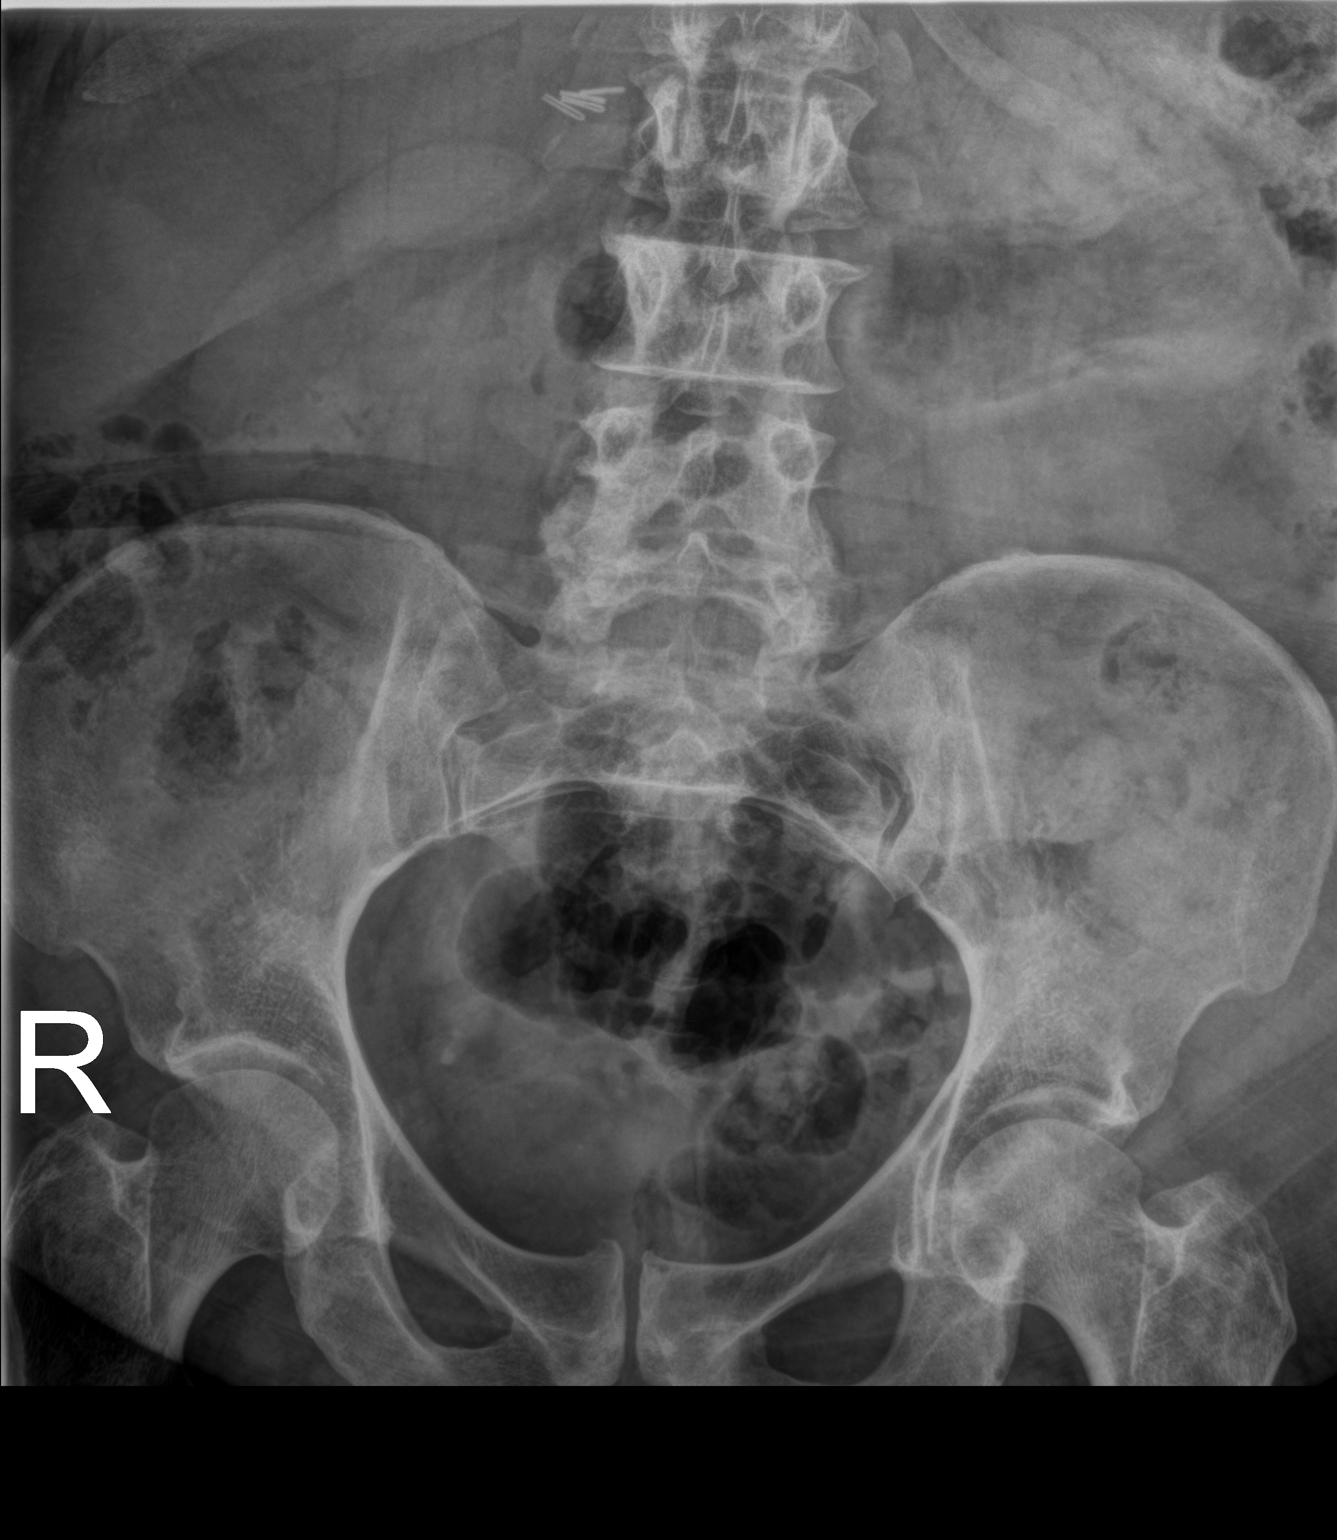

[2 of 2 positions shown; findings below may reference images not displayed]

FINDINGS: Nonobstructive bowel gas pattern.

No evidence of free air under the diaphragm on the upright view.

Cholecystectomy clips.

Mild degenerative changes of the visualized thoracolumbar spine.
IMPRESSION: No evidence of small bowel obstruction or free air.

## 2014-08-16 IMAGING — CT CT ABD-PELV W/ CM
2 of 5 series · 17 of 46 positions shown, 19 images · IV contrast (Omnipaque 300)
Comparison: [DATE]

CLINICAL DATA: Upper abdominal pain, swelling for 1 day.

EXAM:
CT ABDOMEN AND PELVIS WITH CONTRAST
TECHNIQUE: Multidetector CT imaging of the abdomen and pelvis was performed
using the standard protocol following bolus administration of
intravenous contrast.
CONTRAST:  100mL OMNIPAQUE IOHEXOL 300 MG/ML  SOLN

[Series 2: abd_pel_with 5.0 b40f · axial · 0.82mm/px · z∈[-452,-78]mm · 14 of 85 slices shown, 16 images]
[im 5/85  soft-tissue]
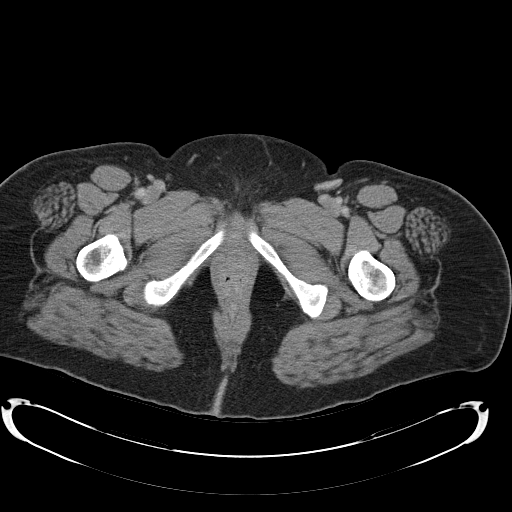
[im 5/85  bone]
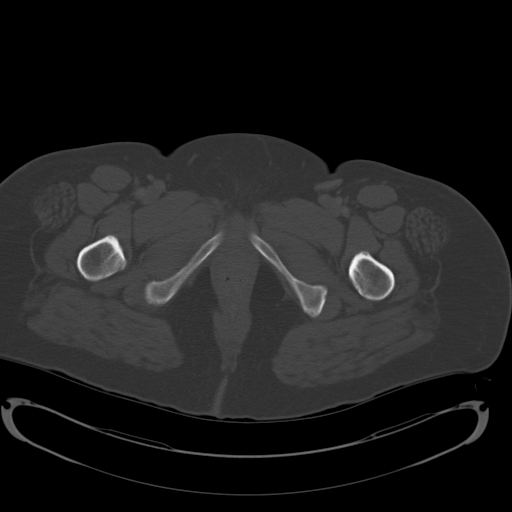
[im 10/85  soft-tissue]
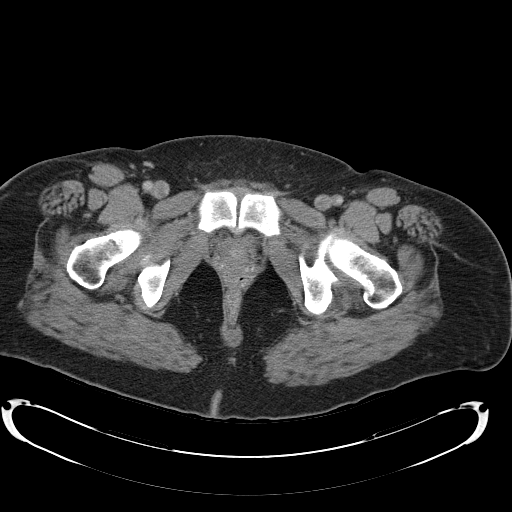
[im 19/85  soft-tissue]
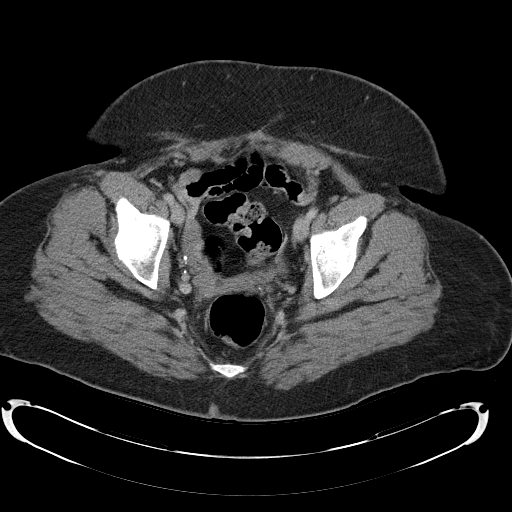
[im 24/85  soft-tissue]
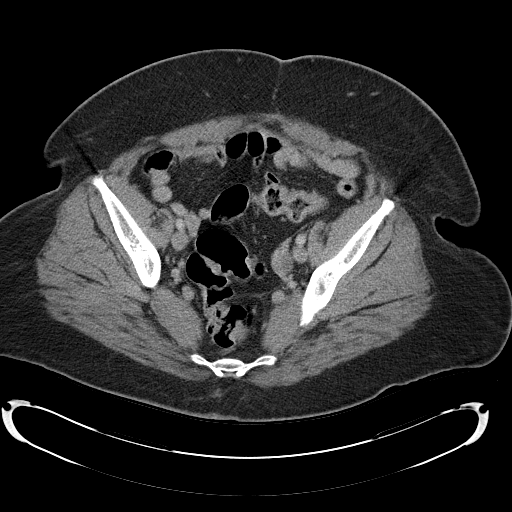
[im 29/85  soft-tissue]
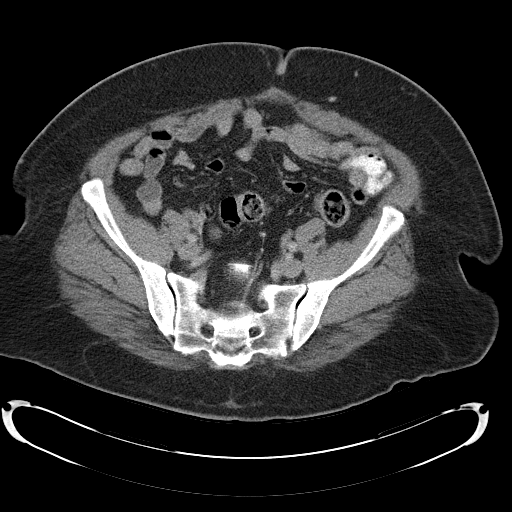
[im 33/85  soft-tissue]
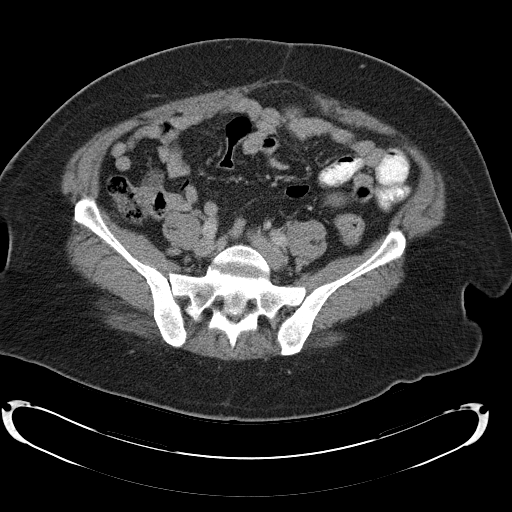
[im 38/85  soft-tissue]
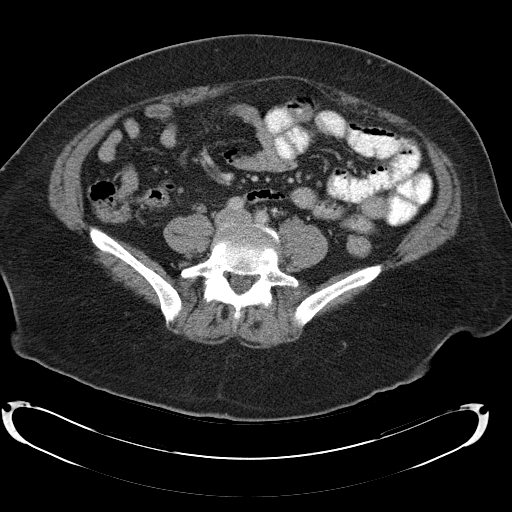
[im 47/85  soft-tissue]
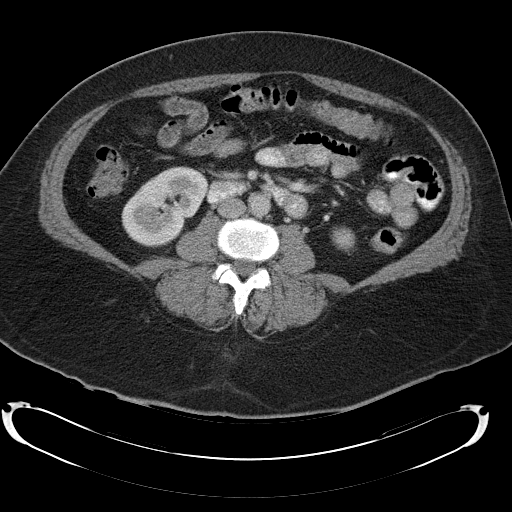
[im 52/85  soft-tissue]
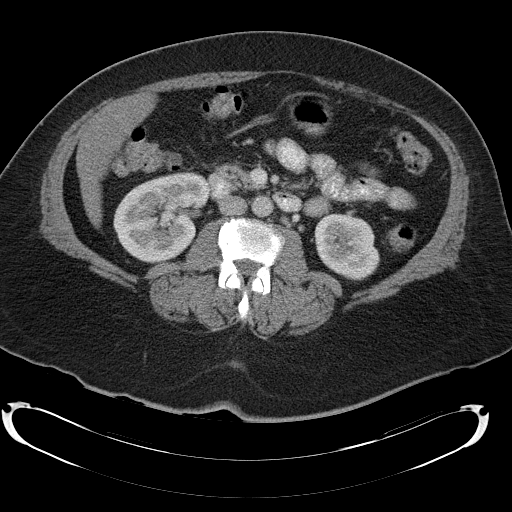
[im 52/85  bone]
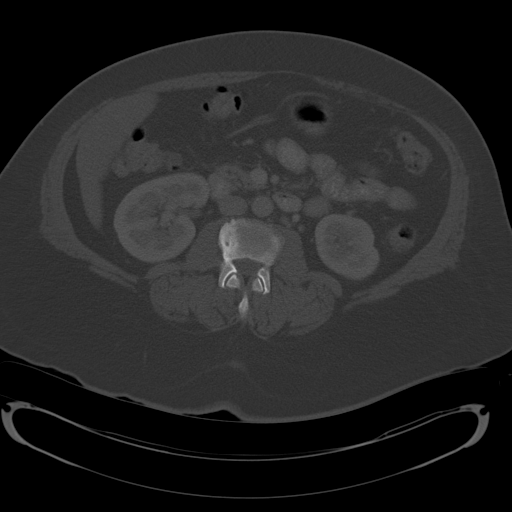
[im 57/85  soft-tissue]
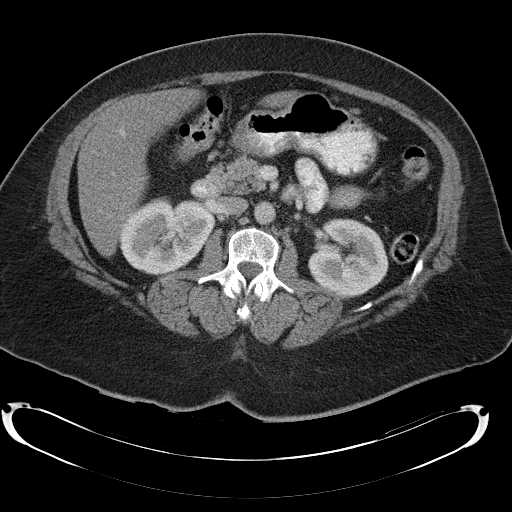
[im 61/85  soft-tissue]
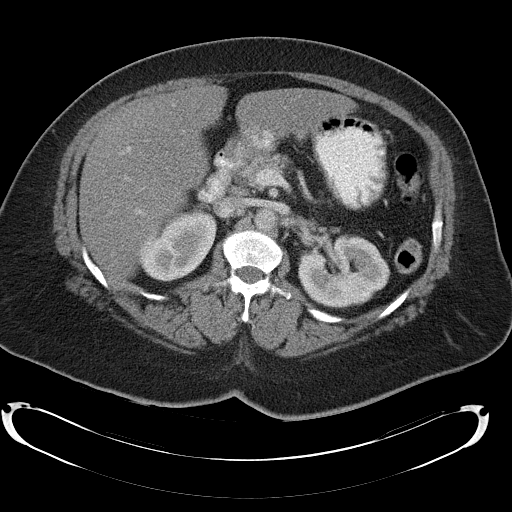
[im 66/85  soft-tissue]
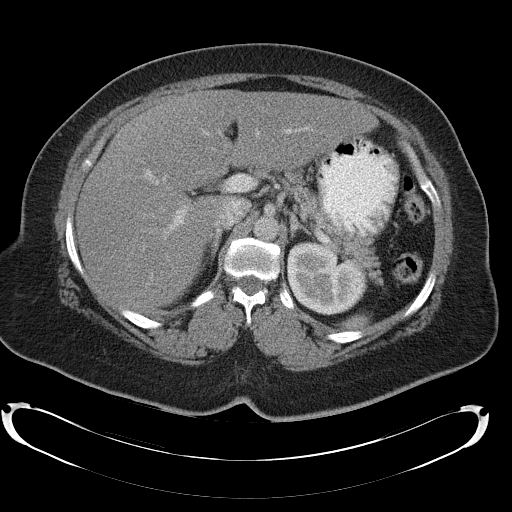
[im 75/85  soft-tissue]
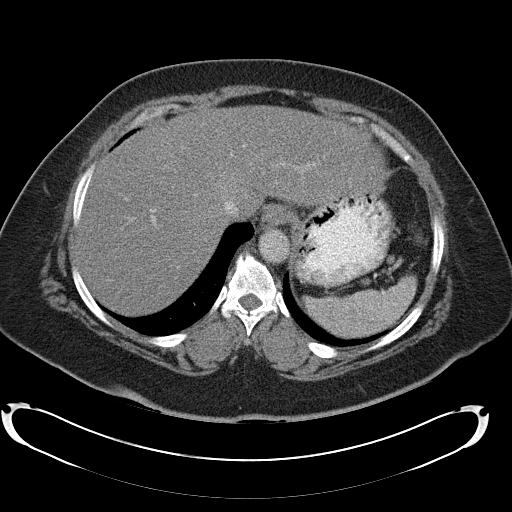
[im 80/85  soft-tissue]
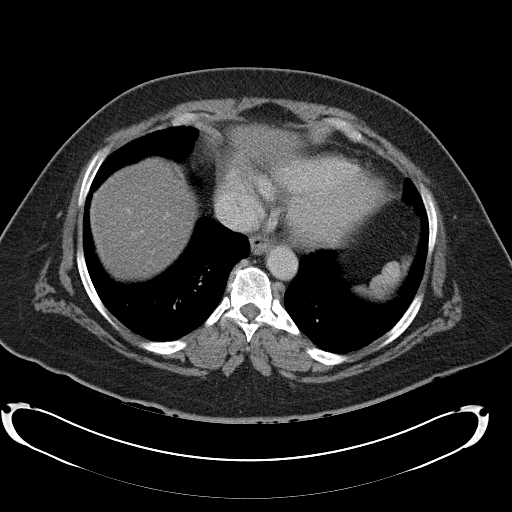

[Series 3: abd_pel_with 3.0 spo cor · coronal · 0.79mm/px · 3 of 96 slices shown]
[im 32/96  soft-tissue]
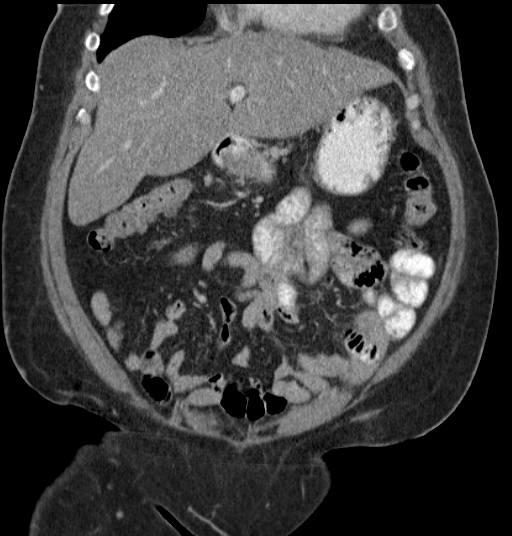
[im 43/96  soft-tissue]
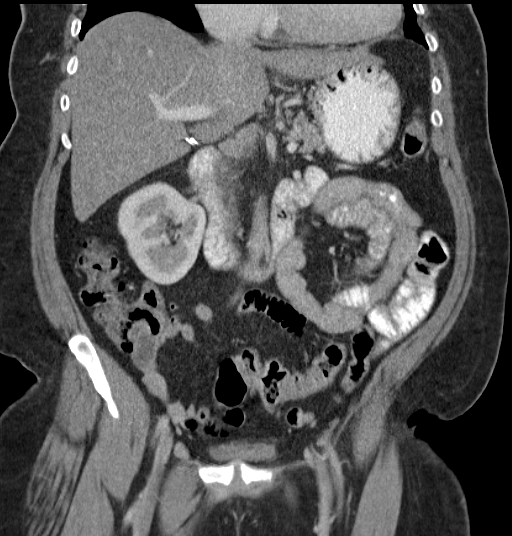
[im 53/96  soft-tissue]
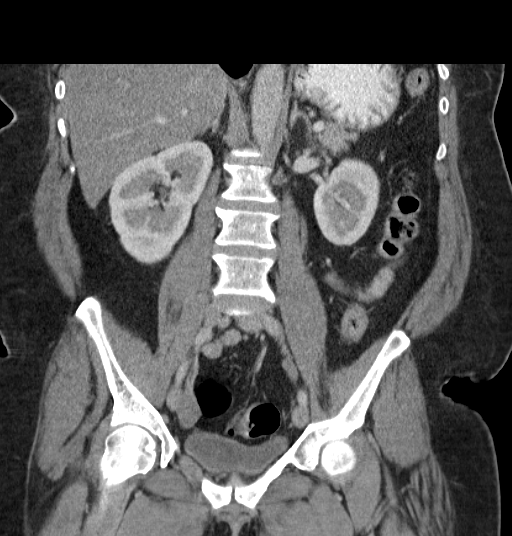

[17 of 46 positions shown; findings below may reference images not displayed]

FINDINGS: Lung bases are clear.  No effusions.  Heart is normal size.

Diffuse fatty infiltration of the liver. No focal hepatic
abnormality. Prior cholecystectomy. Spleen, pancreas, adrenals and
kidneys are normal.

Appendix is visualized and is normal. Stomach, large and small bowel
are unremarkable. No free fluid, free air or adenopathy.

Prior hysterectomy. No adnexal masses. Urinary bladder is
unremarkable.

Aorta is normal caliber.  No acute bony abnormality.
IMPRESSION: Diffuse fatty infiltration of the liver.

No acute findings in the abdomen or pelvis.

## 2014-08-16 MED ORDER — FAMOTIDINE IN NACL 20-0.9 MG/50ML-% IV SOLN
20.0000 mg | Freq: Once | INTRAVENOUS | Status: AC
  Administered 2014-08-16: 20 mg via INTRAVENOUS
  Filled 2014-08-16: qty 50

## 2014-08-16 MED ORDER — MORPHINE SULFATE 4 MG/ML IJ SOLN
4.0000 mg | Freq: Once | INTRAMUSCULAR | Status: AC
  Administered 2014-08-16: 4 mg via INTRAVENOUS
  Filled 2014-08-16: qty 1

## 2014-08-16 MED ORDER — HYDROCODONE-ACETAMINOPHEN 5-325 MG PO TABS
ORAL_TABLET | ORAL | Status: DC
Start: 2014-08-16 — End: 2014-10-24

## 2014-08-16 MED ORDER — ONDANSETRON HCL 4 MG PO TABS: 4.0000 mg | ORAL_TABLET | Freq: Three times a day (TID) | ORAL | Status: DC | PRN

## 2014-08-16 MED ORDER — OMEPRAZOLE 20 MG PO CPDR: 20.0000 mg | DELAYED_RELEASE_CAPSULE | Freq: Every day | ORAL | Status: DC

## 2014-08-16 MED ORDER — IOHEXOL 300 MG/ML  SOLN
100.0000 mL | Freq: Once | INTRAMUSCULAR | Status: AC | PRN
  Administered 2014-08-16: 100 mL via INTRAVENOUS

## 2014-08-16 NOTE — ED Notes (Signed)
MD at bedside. 

## 2014-08-16 NOTE — ED Notes (Signed)
Patient states mid abdominal pain and acid reflux. Patient states she has an abdominal hernia and GERD that she is being treated for. Patient states she ran out of her reflux medication and would like a refill.

## 2014-08-16 NOTE — ED Provider Notes (Signed)
MSE was initiated and I personally evaluated the patient and placed orders (if any) at  6:46 AM on August 16, 2014.  The patient appears stable so that the remainder of the MSE may be completed by another provider.  Mid to upper abdominal pain with new abdominal distention. Vitals normal. No peritoneal findings. Labs and xray and pain control ordered.   Hoy Morn, MD 08/16/14 [phone]

## 2014-08-16 NOTE — Discharge Instructions (Signed)
°Emergency Department Resource Guide °1) Find a Doctor and Pay Out of Pocket °Although you won't have to find out who is covered by your insurance plan, it is a good idea to ask around and get recommendations. You will then need to call the office and see if the doctor you have chosen will accept you as a new patient and what types of options they offer for patients who are self-pay. Some doctors offer discounts or will set up payment plans for their patients who do not have insurance, but you will need to ask so you aren't surprised when you get to your appointment. ° °2) Contact Your Local Health Department °Not all health departments have doctors that can see patients for sick visits, but many do, so it is worth a call to see if yours does. If you don't know where your local health department is, you can check in your phone book. The CDC also has a tool to help you locate your state's health department, and many state websites also have listings of all of their local health departments. ° °3) Find a Walk-in Clinic °If your illness is not likely to be very severe or complicated, you may want to try a walk in clinic. These are popping up all over the country in pharmacies, drugstores, and shopping centers. They're usually staffed by nurse practitioners or physician assistants that have been trained to treat common illnesses and complaints. They're usually fairly quick and inexpensive. However, if you have serious medical issues or chronic medical problems, these are probably not your best option. ° °No Primary Care Doctor: °- Call [facility] at  832-8000 - they can help you locate a primary care doctor that  accepts your insurance, provides certain services, etc. °- Physician Referral Service- 1-800-533-3463 ° °Chronic Pain Problems: °Organization         Address  Phone   Notes  °[facility]  (336) [phone] Patients need to be referred by their primary care doctor.  ° °Medication  Assistance: °Organization         Address  Phone   Notes  °[place] [facility] [address] °[place], [place] [address] (336) 641-8030 --Must be a resident of [place] °-- Must have NO insurance coverage whatsoever (no Medicaid/ Medicare, etc.) °-- The pt. MUST have a primary care doctor that directs their care regularly and follows them in the community °  °[facility]  (866) 331-1348   °[employer]  (888) 892-1162   ° °Agencies that provide inexpensive medical care: °Organization         Address  Phone   Notes  °[facility]  (336) 832-8035   °[facility]    (336) 832-7272   °[facility] [address] °[place], [place] [address] (336) 832-4777   °[facility] [address], °[place] (336) 271-4999   °[facility]    (336) 373-0678   °[facility]    (336) 272-1050   °[facility] ° [address], [place] Phone:  (336) 832-4444, Fax:  (336) 832-4440 Hours of Operation:  9 am - 6 pm, M-F.  Also accepts Medicaid/Medicare and self-pay.  °[facility] ° [address], [place] Phone: (336) 832-3150, Fax: (336) [phone]. Hours of Operation:  8:30 am - 5:30 pm, M-F.  Also accepts Medicaid and self-pay.  °[facility] [place] [address], [place] Phone: (336) 878-6027   °[facility] [address], [place], [place] (336)723-1848, Ext. 123 Mondays & Thursdays: 7-9 AM.  First 15 patients are seen on a first come, first serve basis. °  ° °Medicaid-accepting [place] Providers: ° °Organization         Address  Phone   Notes  °[facility] [address], [place] (336) 641-2100 Also accepts self-pay patients.  °[facility] [address], [place] ° (336) 856-9996   °[facility] [address], [place]  (336) 288-8857   °[facility] [address] [place] [address], [place] (336) 299-7000   °Veita Bland [address], [place]  ° (336) 373-1557 Only accepts [place] Access Medicaid patients after they have their name applied to their card.  ° °Self-Pay (no insurance) in [place]: ° °Organization         Address  Phone   Notes  °Sickle Cell Patients, [facility] [address], [place] (336) 832-1970   °[facility] [address], [place] (336) 832-4400   °[facility] [place] [facility] ° [address], [place] (336) 992-4800   °[facility]/Dr. Osei-Bonsu ° [address] [place] [address], [place] or [address], [place] (336) [phone] Phone number for both [place] and [place] locations is the same.  °[facility] [address], [place] (336) 299-0000   °[facility] [address] [place] [address], [place] or [address] (336) 852-7530 °(336) 878-2260   °[facility] [address], [place] (336) 350-1642, phone; (336) 294-5005, fax Sees patients 1st and 3rd Saturday of every month.  Must not qualify for public or private insurance (i.e. Medicaid, Medicare, [place] Health Choice, Veterans' Benefits) • Household income should be no more than 200% of the poverty level •The clinic cannot treat you if you are pregnant or think you are pregnant • Sexually transmitted diseases are not treated at the clinic.  ° ° °Dental Care: °Organization         Address  Phone  Notes  °[place] [facility] [address], [place] (336) 641-6152 Accepts children up to age 21 who are enrolled in Medicaid or [place] Health Choice; pregnant women with a Medicaid card; and children who have applied for Medicaid or [place] Health Choice, but were declined, whose parents can pay a reduced fee at time of service.  °[place]  [facility] [place]  [address], [place] (336) 641-7733 Accepts children up to age 21 who are enrolled in Medicaid or [place] Health Choice; pregnant women with a Medicaid card; and children who have applied for Medicaid or [place] Health Choice, but were declined, whose parents can pay a reduced fee at time of service.  °[facility] ° [address], [place] (336) 641-4533 Patients are seen by appointment only. Walk-ins are not accepted. [facility] will see patients 18 years of age and older. °Monday - Tuesday (8am-5pm) °Most Wednesdays (8:30-5pm) °$30 per visit, cash only  °[facility] ° [address], [place] (336) 641-4533 Patients are seen by appointment only. Walk-ins are not accepted. [facility] will see patients 18 years of age and older. °One   Wednesday Evening (Monthly: Volunteer Based).  $30 per visit, cash only  °[facility]  (919) [phone] for adults; Children under age 4, call Graduate Pediatric Dentistry at ([phone]) [phone]. Children aged 4-14, please call ([phone]) [phone] to request a pediatric application. ° Dental services are provided in all areas of dental care including fillings, crowns and bridges, complete and partial dentures, implants, gum treatment, root canals, and extractions. Preventive care is also provided. Treatment is provided to both adults and children. °Patients are selected via a lottery and there is often a waiting list. °  °[facility] [address], °[place] ° (336) 763-8833 [email or web address] °  °[facility] [address], [place], [place] (336)723-1848, Ext. 123 Second and Fourth Thursday of each month, opens at 6:30 AM; Clinic ends at 9 AM.  Patients are seen on a first-come first-served basis, and a limited number are seen during each clinic.  ° °[facility] ° [address], [place], [place] (336) [phone]    Eligibility Requirements °You must have lived in [place], or [place] counties for at least the last three months. °  You cannot be eligible for state or federal sponsored healthcare insurance, including Veterans Administration, Medicaid, or Medicare. °  You generally cannot be eligible for healthcare insurance through your employer.  °  How to apply: °Eligibility screenings are held every Tuesday and Wednesday afternoon from 1:00 pm until 4:00 pm. You do not need an appointment for the interview!  °[facility] [address], [place], [place] 336-631-2330   °[facility]  336-342-8273   °[facility]  336-703-3100   °[facility]  336-570-6415   ° °Behavioral Health Resources in the Community: °Intensive Outpatient Programs °Organization         Address  Phone  Notes  °[facility] [address], [place], [place] 336-878-6098   °[facility] [address], [place], [place] 336-832-9800   °[facility] [address], [place], [place] ° 336-882-2125   °[facility] [address],  °[place], [place] 1-800-853-5163 or 336-641-4981   °Substance Abuse Resources °Organization         Address  Phone  Notes  °[facility]  336-882-2125   °[facility]  336-784-9470   °[facility]  336-285-9073   °[facility]  336-845-3988   °Residential & Outpatient Substance Abuse Program  1-800-659-3381   °Psychological Services °Organization         Address  Phone  Notes  °[facility]  336- 832-9600   °[facility]  336- 378-7881   °[facility] [address], [place] 1-800-853-5163 or 336-641-4981   ° °Mobile Crisis Teams °Organization         Address  Phone  Notes  °[facility], Mobile Crisis Care Unit  1-877-626-1772   °Assertive °[facility] ° [address]  [place], [place] 336-834-9664   °Sharon DeEsch [address] °[place] 336-554-5454   ° °Self-Help/Support Groups °Organization         Address  Phone             Notes  °[facility] - variety of support groups  336- 373-1402 Call for more information  °Narcotics Anonymous (NA), [facility] [address], °[place]   2 meetings at this location  ° °  Residential Treatment Programs Organization         Address  Phone  Notes  [facility] [address],    [place]  1-317-077-2514   [facility]  [address], [place] [address], [place], [place]   [place], [place] ([phone]) [phone] Admissions: 8am-3pm M-F  [facility] [place] [address],    [place], [place] [phone]   [facility] [address] [place], [place], [place]   [facility] [address],  [place], [place]   [facility] Intensive Outpatient [address], [place], [place]   [facility] [address]  [place], [place] 1-367-656-9895 or 680-384-1956   [facility] [address], [place], [place] Accepts Medicaid  [facility] [address] [address]  [place] 1-(585)226-9892 Substance Abuse/Addiction Treatment   [facility] Organization         Address  Phone  Notes  [facility]  413-657-5300   Domenic Schwab, PhD [address] Arlis Porta [place], [place]   530-777-6004 or (204)[phone]   [place], [place] [phone]   [facility] [address] [place], [place] [phone] Insurance/Medicaid/sponsorship through [facility] [address] [place]                                    [place], [place] [phone] [place] [address], [place] [phone]    Dr. Adele Schilder  [phone]   [facility] Dept. 1) [address], [place] 2) [place] 3)  [address], [place] [phone]  [phone]   [place] [phone] or [phone] (After Hours)      Eat a bland diet, avoiding greasy, fatty, fried foods, as well as spicy and acidic foods or beverages.  Avoid eating within the hour or 2 before going to bed or laying down.  Also avoid teas, colas, coffee, chocolate, pepermint and spearment.  Take over the counter pepcid, one tablet by mouth twice a day, for the next 3 to 4 weeks.  May also take over the counter maalox/mylanta, as directed on packaging, as needed for discomfort.  Take the prescriptions as directed.  Call your regular medical doctor today to schedule a follow up appointment this week. Call the GI doctor today to schedule a follow up appointment within the next week.  Return to the Emergency Department immediately if worsening.

## 2014-08-16 NOTE — ED Provider Notes (Signed)
CSN: [id number]     Arrival date & time 08/16/14  6440 History   First MD Initiated Contact with Patient 08/16/14 [phone]     Chief Complaint  Patient presents with  . Abdominal Pain      HPI Pt was seen at 0720.  Per pt, c/o gradual onset and persistence of constant acute flair of her chronic uppper abd "pain" for the past several months, worse over the past 1 week. Describes the abd pain as per her usual chronic pain pattern. Pt states her pain worsened when she ran out of her "acid reflux medication" 1 week ago. Pt is requesting a refill of this medication.  Denies N/V, no diarrhea, no fevers, no back pain, no rash, no CP/SOB, no black or blood in stools.        Past Medical History  Diagnosis Date  . Type 2 diabetes mellitus   . Essential hypertension, benign   . GERD (gastroesophageal reflux disease)   . COPD (chronic obstructive pulmonary disease)   . Arthritis   . Normal coronary arteries     3/10 - following abnormal Myoview  . Diabetes mellitus   . CHF (congestive heart failure)   . Chronic abdominal pain    Past Surgical History  Procedure Laterality Date  . Tubal ligation    . Cholecystectomy    . Cesarean section    . Abdominal hysterectomy  09/10/2011    Procedure: HYSTERECTOMY ABDOMINAL;  Surgeon: Jonnie Kind, MD;  Location: [facility];  Service: Gynecology;  Laterality: N/A;  Abdominal hysterectomy  . Scar revision  09/10/2011    Procedure: SCAR REVISION;  Surgeon: Jonnie Kind, MD;  Location: [facility];  Service: Gynecology;  Laterality: N/A;  Wide Excision of old Cicatrix   Family History  Problem Relation Age of Onset  . Cirrhosis Mother   . Diabetes type II Father   . Diabetes type II Sister   . Anesthesia problems Neg Hx   . Hypotension Neg Hx   . Malignant hyperthermia Neg Hx   . Pseudochol deficiency Neg Hx    History  Substance Use Topics  . Smoking status: Current Every Day Smoker -- 0.25 packs/day for 29 years    Types: Cigarettes  . Smokeless  tobacco: Never Used     Comment: 1 cigarette a day  . Alcohol Use: No    Review of Systems ROS: Statement: All systems negative except as marked or noted in the HPI; Constitutional: Negative for fever and chills. ; ; Eyes: Negative for eye pain, redness and discharge. ; ; ENMT: Negative for ear pain, hoarseness, nasal congestion, sinus pressure and sore throat. ; ; Cardiovascular: Negative for chest pain, palpitations, diaphoresis, dyspnea and peripheral edema. ; ; Respiratory: Negative for cough, wheezing and stridor. ; ; Gastrointestinal: +abd pain. Negative for nausea, vomiting, diarrhea, blood in stool, hematemesis, jaundice and rectal bleeding. . ; ; Genitourinary: Negative for dysuria, flank pain and hematuria. ; ; Musculoskeletal: Negative for back pain and neck pain. Negative for swelling and trauma.; ; Skin: Negative for pruritus, rash, abrasions, blisters, bruising and skin lesion.; ; Neuro: Negative for headache, lightheadedness and neck stiffness. Negative for weakness, altered level of consciousness , altered mental status, extremity weakness, paresthesias, involuntary movement, seizure and syncope.     Allergies  Bee venom and Naproxen  Home Medications   Prior to Admission medications   Medication Sig Start Date End Date Taking? Authorizing Provider  benazepril-hydrochlorthiazide (LOTENSIN HCT) 10-12.5 MG per tablet Take  1 tablet by mouth daily.   Yes Historical Provider, MD  albuterol (PROVENTIL HFA;VENTOLIN HFA) 108 (90 BASE) MCG/ACT inhaler Inhale 1-2 puffs into the lungs every 6 (six) hours as needed for wheezing. 08/25/12   Nat Christen, MD  HYDROcodone-acetaminophen (NORCO/VICODIN) 5-325 MG per tablet 1 or 2 tabs PO q6 hours prn pain 05/03/14   Francine Graven, DO  ibuprofen (ADVIL,MOTRIN) 200 MG tablet Take 400-800 mg by mouth every 6 (six) hours as needed for moderate pain.     Historical Provider, MD  metFORMIN (GLUCOPHAGE) 1000 MG tablet Take 1,000 mg by mouth 2 (two) times  daily with a meal.    Historical Provider, MD  omeprazole (PRILOSEC) 20 MG capsule Take 20 mg by mouth daily.    Historical Provider, MD   BP 132/71 mmHg  Pulse 92  Temp(Src) 97.9 F (36.6 C) (Oral)  Resp 22  Ht 5\' 6"  (1.676 m)  Wt 223 lb (101.152 kg)  BMI 36.01 kg/m2  SpO2 100%  LMP 08/21/2011 Physical Exam  0725: Physical examination:  Nursing notes reviewed; Vital signs and O2 SAT reviewed;  Constitutional: Well developed, Well nourished, Well hydrated, In no acute distress; Head:  Normocephalic, atraumatic; Eyes: EOMI, PERRL, No scleral icterus; ENMT: Mouth and pharynx normal, Mucous membranes moist; Neck: Supple, Full range of motion, No lymphadenopathy; Cardiovascular: Regular rate and rhythm, No murmur, rub, or gallop; Respiratory: Breath sounds clear & equal bilaterally, No rales, rhonchi, wheezes.  Speaking full sentences with ease, Normal respiratory effort/excursion; Chest: Nontender, Movement normal; Abdomen: Soft, +mid-epigastric tenderness to palp. No rebound or guarding. Normal bowel sounds; Genitourinary: No CVA tenderness; Extremities: Pulses normal, No tenderness, No edema, No calf edema or asymmetry.; Neuro: AA&Ox3, Major CN grossly intact.  Speech clear. No gross focal motor or sensory deficits in extremities.; Skin: Color normal, Warm, Dry.   ED Course  Procedures     EKG Interpretation None      MDM  MDM Reviewed: previous chart, nursing note and vitals Reviewed previous: labs, CT scan and ultrasound Interpretation: labs, CT scan and x-ray   Results for orders placed or performed during the hospital encounter of 08/16/14  CBC with Differential  Result Value Ref Range   WBC 8.9 4.0 - 10.5 K/uL   RBC 5.41 (H) 3.87 - 5.11 MIL/uL   Hemoglobin 15.8 (H) 12.0 - 15.0 g/dL   HCT 46.6 (H) 36.0 - 46.0 %   MCV 86.1 78.0 - 100.0 fL   MCH 29.2 26.0 - 34.0 pg   MCHC 33.9 30.0 - 36.0 g/dL   RDW 13.0 11.5 - 15.5 %   Platelets 261 150 - 400 K/uL   Neutrophils  Relative % 47 43 - 77 %   Neutro Abs 4.2 1.7 - 7.7 K/uL   Lymphocytes Relative 42 12 - 46 %   Lymphs Abs 3.7 0.7 - 4.0 K/uL   Monocytes Relative 10 3 - 12 %   Monocytes Absolute 0.9 0.1 - 1.0 K/uL   Eosinophils Relative 1 0 - 5 %   Eosinophils Absolute 0.1 0.0 - 0.7 K/uL   Basophils Relative 0 0 - 1 %   Basophils Absolute 0.0 0.0 - 0.1 K/uL  Comprehensive metabolic panel  Result Value Ref Range   Sodium 134 (L) 135 - 145 mmol/L   Potassium 3.6 3.5 - 5.1 mmol/L   Chloride 100 96 - 112 mEq/L   CO2 25 19 - 32 mmol/L   Glucose, Bld 270 (H) 70 - 99 mg/dL   BUN 10  6 - 23 mg/dL   Creatinine, Ser 0.71 0.50 - 1.10 mg/dL   Calcium 9.0 8.4 - 10.5 mg/dL   Total Protein 7.4 6.0 - 8.3 g/dL   Albumin 4.0 3.5 - 5.2 g/dL   AST 13 0 - 37 U/L   ALT 16 0 - 35 U/L   Alkaline Phosphatase 105 39 - 117 U/L   Total Bilirubin 0.9 0.3 - 1.2 mg/dL   GFR calc non Af Amer >90 >90 mL/min   GFR calc Af Amer >90 >90 mL/min   Anion gap 9 5 - 15  Lipase, blood  Result Value Ref Range   Lipase 18 11 - 59 U/L   Ct Abdomen Pelvis W Contrast 08/16/2014   CLINICAL DATA:  Upper abdominal pain, swelling for 1 day.  EXAM: CT ABDOMEN AND PELVIS WITH CONTRAST  TECHNIQUE: Multidetector CT imaging of the abdomen and pelvis was performed using the standard protocol following bolus administration of intravenous contrast.  CONTRAST:  165mL OMNIPAQUE IOHEXOL 300 MG/ML  SOLN  COMPARISON:  05/03/2014  FINDINGS: Lung bases are clear.  No effusions.  Heart is normal size.  Diffuse fatty infiltration of the liver. No focal hepatic abnormality. Prior cholecystectomy. Spleen, pancreas, adrenals and kidneys are normal.  Appendix is visualized and is normal. Stomach, large and small bowel are unremarkable. No free fluid, free air or adenopathy.  Prior hysterectomy. No adnexal masses. Urinary bladder is unremarkable.  Aorta is normal caliber.  No acute bony abnormality.  IMPRESSION: Diffuse fatty infiltration of the liver.  No acute findings  in the abdomen or pelvis.   Electronically Signed   By: Rolm Baptise M.D.   On: 08/16/2014 08:14   Dg Abd 2 Views 08/16/2014   CLINICAL DATA:  Abdominal distention, hernia  EXAM: ABDOMEN - 2 VIEW  COMPARISON:  CT abdomen pelvis dated 05/03/2014  FINDINGS: Nonobstructive bowel gas pattern.  No evidence of free air under the diaphragm on the upright view.  Cholecystectomy clips.  Mild degenerative changes of the visualized thoracolumbar spine.  IMPRESSION: No evidence of small bowel obstruction or free air.   Electronically Signed   By: Julian Hy M.D.   On: 08/16/2014 07:34    0900:  Pt has tol PO well while in the ED without N/V (fast food meal).  No stooling while in the ED.  Abd benign, VSS. Feels better and wants to go home now. Will refill Prilosec. Dx and testing d/w pt.  Questions answered.  Verb understanding, agreeable to d/c home with outpt f/u.        Francine Graven, DO 08/18/14 2111

## 2014-08-16 NOTE — ED Notes (Signed)
Pt c/o mid abdominal pain x 1 day; pt states she has run out of her acid reflux medication x 1 week ago

## 2014-08-17 LAB — URINE CULTURE: Colony Count: 60000

## 2014-10-24 ENCOUNTER — Encounter (HOSPITAL_COMMUNITY): Payer: Self-pay | Admitting: Emergency Medicine

## 2014-10-24 ENCOUNTER — Emergency Department (HOSPITAL_COMMUNITY)
Admission: EM | Admit: 2014-10-24 | Discharge: 2014-10-24 | Disposition: A | Discharge status: Home / Self Care | Payer: Self-pay | Attending: Emergency Medicine | Admitting: Emergency Medicine

## 2014-10-24 DIAGNOSIS — Z720 Tobacco use: Secondary | ICD-10-CM | POA: Insufficient documentation

## 2014-10-24 DIAGNOSIS — M1990 Unspecified osteoarthritis, unspecified site: Secondary | ICD-10-CM | POA: Insufficient documentation

## 2014-10-24 DIAGNOSIS — E119 Type 2 diabetes mellitus without complications: Secondary | ICD-10-CM | POA: Insufficient documentation

## 2014-10-24 DIAGNOSIS — H9201 Otalgia, right ear: Secondary | ICD-10-CM | POA: Insufficient documentation

## 2014-10-24 DIAGNOSIS — J449 Chronic obstructive pulmonary disease, unspecified: Secondary | ICD-10-CM | POA: Insufficient documentation

## 2014-10-24 DIAGNOSIS — K219 Gastro-esophageal reflux disease without esophagitis: Secondary | ICD-10-CM | POA: Insufficient documentation

## 2014-10-24 DIAGNOSIS — I10 Essential (primary) hypertension: Secondary | ICD-10-CM | POA: Insufficient documentation

## 2014-10-24 DIAGNOSIS — K029 Dental caries, unspecified: Secondary | ICD-10-CM | POA: Insufficient documentation

## 2014-10-24 DIAGNOSIS — R630 Anorexia: Secondary | ICD-10-CM | POA: Insufficient documentation

## 2014-10-24 DIAGNOSIS — G8929 Other chronic pain: Secondary | ICD-10-CM | POA: Insufficient documentation

## 2014-10-24 DIAGNOSIS — J029 Acute pharyngitis, unspecified: Secondary | ICD-10-CM | POA: Insufficient documentation

## 2014-10-24 DIAGNOSIS — Z79899 Other long term (current) drug therapy: Secondary | ICD-10-CM | POA: Insufficient documentation

## 2014-10-24 DIAGNOSIS — M791 Myalgia: Secondary | ICD-10-CM | POA: Insufficient documentation

## 2014-10-24 DIAGNOSIS — I509 Heart failure, unspecified: Secondary | ICD-10-CM | POA: Insufficient documentation

## 2014-10-24 MED ORDER — ONDANSETRON HCL 4 MG PO TABS
4.0000 mg | ORAL_TABLET | Freq: Once | ORAL | Status: AC
  Administered 2014-10-24: 4 mg via ORAL
  Filled 2014-10-24: qty 1

## 2014-10-24 MED ORDER — HYDROCODONE-ACETAMINOPHEN 5-325 MG PO TABS: 1.0000 | ORAL_TABLET | ORAL | Status: DC | PRN

## 2014-10-24 MED ORDER — AMOXICILLIN 500 MG PO CAPS: 500.0000 mg | ORAL_CAPSULE | Freq: Three times a day (TID) | ORAL | Status: DC

## 2014-10-24 MED ORDER — KETOROLAC TROMETHAMINE 10 MG PO TABS
10.0000 mg | ORAL_TABLET | Freq: Once | ORAL | Status: AC
Start: 2014-10-24 — End: 2014-10-24
  Administered 2014-10-24: 10 mg via ORAL
  Filled 2014-10-24: qty 1

## 2014-10-24 MED ORDER — HYDROCODONE-ACETAMINOPHEN 5-325 MG PO TABS
2.0000 | ORAL_TABLET | Freq: Once | ORAL | Status: AC
  Administered 2014-10-24: 2 via ORAL
  Filled 2014-10-24: qty 2

## 2014-10-24 MED ORDER — AMOXICILLIN 250 MG PO CAPS
500.0000 mg | ORAL_CAPSULE | Freq: Once | ORAL | Status: AC
  Administered 2014-10-24: 500 mg via ORAL
  Filled 2014-10-24: qty 2

## 2014-10-24 NOTE — ED Provider Notes (Signed)
CSN: [id number]     Arrival date & time 10/24/14  0901 History   First MD Initiated Contact with Patient 10/24/14 1021     Chief Complaint  Patient presents with  . Sore Throat     (Consider location/radiation/quality/duration/timing/severity/associated sxs/prior Treatment) HPI Comments: Patient is a 49 year old female who presents to the emergency department with sore throat, ear pain, and generally not feeling well.  Patient states that 3 days ago she began having some sore throat. She then noticed a change in the texture of her tongue, and sore area in the upper part of the "roof" of her mouth. Following this she began to have pain in her neck just below the ears. This was followed by a pain in the ears, right greater than left. She states she also had some coughing episodes. She is unsure of temperature elevation, but feels that she had some chills.  Patient presents to the emergency department for evaluation and for assistance with her discomfort of multiple areas.  Patient is a 49 y.o. female presenting with pharyngitis. The history is provided by the patient.  Sore Throat Associated symptoms include chills, congestion, coughing, fatigue, myalgias and a sore throat. Pertinent negatives include no abdominal pain, arthralgias, chest pain, neck pain or rash.    Past Medical History  Diagnosis Date  . Type 2 diabetes mellitus   . Essential hypertension, benign   . GERD (gastroesophageal reflux disease)   . COPD (chronic obstructive pulmonary disease)   . Arthritis   . Normal coronary arteries     3/10 - following abnormal Myoview  . Diabetes mellitus   . CHF (congestive heart failure)   . Chronic abdominal pain    Past Surgical History  Procedure Laterality Date  . Tubal ligation    . Cholecystectomy    . Cesarean section    . Abdominal hysterectomy  09/10/2011    Procedure: HYSTERECTOMY ABDOMINAL;  Surgeon: Jonnie Kind, MD;  Location: [facility];  Service: Gynecology;   Laterality: N/A;  Abdominal hysterectomy  . Scar revision  09/10/2011    Procedure: SCAR REVISION;  Surgeon: Jonnie Kind, MD;  Location: [facility];  Service: Gynecology;  Laterality: N/A;  Wide Excision of old Cicatrix   Family History  Problem Relation Age of Onset  . Cirrhosis Mother   . Diabetes type II Father   . Diabetes type II Sister   . Anesthesia problems Neg Hx   . Hypotension Neg Hx   . Malignant hyperthermia Neg Hx   . Pseudochol deficiency Neg Hx    History  Substance Use Topics  . Smoking status: Current Every Day Smoker -- 0.25 packs/day for 29 years    Types: Cigarettes  . Smokeless tobacco: Never Used     Comment: 1 cigarette a day  . Alcohol Use: No   OB History    No data available     Review of Systems  Constitutional: Positive for chills, appetite change and fatigue. Negative for activity change.       All ROS Neg except as noted in HPI  HENT: Positive for congestion, dental problem, ear pain and sore throat. Negative for nosebleeds and voice change.   Eyes: Negative for photophobia and discharge.  Respiratory: Positive for cough. Negative for shortness of breath and wheezing.   Cardiovascular: Negative for chest pain and palpitations.  Gastrointestinal: Negative for abdominal pain and blood in stool.  Genitourinary: Negative for dysuria, frequency and hematuria.  Musculoskeletal: Positive for myalgias. Negative for  back pain, arthralgias and neck pain.  Skin: Negative.  Negative for rash.  Neurological: Negative for dizziness, seizures and speech difficulty.  Psychiatric/Behavioral: Negative for hallucinations and confusion.      Allergies  Bee venom and Naproxen  Home Medications   Prior to Admission medications   Medication Sig Start Date End Date Taking? Authorizing Provider  albuterol (PROVENTIL HFA;VENTOLIN HFA) 108 (90 BASE) MCG/ACT inhaler Inhale 1-2 puffs into the lungs every 6 (six) hours as needed for wheezing. 08/25/12  Yes Nat Christen,  MD  benazepril-hydrochlorthiazide (LOTENSIN HCT) 10-12.5 MG per tablet Take 1 tablet by mouth daily.   Yes Historical Provider, MD  ibuprofen (ADVIL,MOTRIN) 200 MG tablet Take 400-800 mg by mouth every 6 (six) hours as needed for moderate pain.    Yes Historical Provider, MD  metFORMIN (GLUCOPHAGE) 1000 MG tablet Take 1,000 mg by mouth 2 (two) times daily with a meal.   Yes Historical Provider, MD  omeprazole (PRILOSEC) 20 MG capsule Take 1 capsule (20 mg total) by mouth daily. 08/16/14  Yes Francine Graven, DO  HYDROcodone-acetaminophen (NORCO/VICODIN) 5-325 MG per tablet 1 or 2 tabs PO q6 hours prn pain Patient not taking: Reported on 10/24/2014 08/16/14   Francine Graven, DO  ondansetron (ZOFRAN) 4 MG tablet Take 1 tablet (4 mg total) by mouth every 8 (eight) hours as needed for nausea or vomiting. Patient not taking: Reported on 10/24/2014 08/16/14   Francine Graven, DO   BP 165/95 mmHg  Pulse 76  Resp 18  Ht 5\' 6"  (1.676 m)  Wt 220 lb (99.791 kg)  BMI 35.53 kg/m2  SpO2 96%  LMP 08/21/2011 Physical Exam  Constitutional: She is oriented to [place], place, [name]  Non-toxic appearance.  HENT:  Head: Normocephalic.  Right Ear: Tympanic membrane and external ear normal.  Left Ear: Tympanic membrane and external ear normal.  There is no excessive redness or tenderness of the mastoid areas. The external auditory canals are clear bilaterally. There is no increased redness or bulging of the tympanic membranes bilaterally. There is tenderness just under the ear in the upper portion of the cervical chain.  Mild nasal congestion noted.  Multiple increase red areas involving the tongue. There is also a red splotch in the hard palate area. The airway is patent. The uvula is swollen, but midline.  There is a left upper premolar that is decayed to the gum line, with swelling of the gum. There is no visible abscess appreciated. No swelling under the  tongue. No tonsilar abscess noted.  Eyes: EOM and lids are normal. Pupils are equal, round, and reactive to light.  Neck: Normal range of motion. Neck supple. Carotid bruit is not present.  Cardiovascular: Normal rate, regular rhythm, normal heart sounds, intact distal pulses and normal pulses.   Pulmonary/Chest: Breath sounds normal. No respiratory distress. She has no wheezes.  Abdominal: Soft. Bowel sounds are normal. There is no tenderness. There is no guarding.  Musculoskeletal: Normal range of motion.  Lymphadenopathy:       Head (right side): No submandibular adenopathy present.       Head (left side): No submandibular adenopathy present.    She has no cervical adenopathy.  Neurological: She is alert and oriented to [place], place, and time. She has normal strength. No cranial nerve deficit or sensory deficit.  Skin: Skin is warm and dry.  Psychiatric: She has a normal mood and affect. Her speech is normal.  Nursing note and vitals reviewed.  ED Course  Procedures (including critical care time) Labs Review Labs Reviewed - No data to display  Imaging Review No results found.   EKG Interpretation None      MDM  Examination supports pharyngitis and dental caries. The patient will be treated with amoxicillin, Norco, and Celebrex. Patient advised to use salt water gargles when possible. Patient is to follow-up with her primary physician next week for recheck.    Final diagnoses:  None    *I have reviewed nursing notes, vital signs, and all appropriate lab and imaging results for this patient.    Lily Kocher, PA-C 10/24/14 [place], PA-C 10/24/14 1135  Milton Ferguson, MD 10/24/14 1524

## 2014-10-24 NOTE — ED Notes (Signed)
Pt reports sore throat,coughing,right ear pain since Saturday. nad noted.

## 2014-10-24 NOTE — Discharge Instructions (Signed)
Salt water gargles maybe helpful. Please use Tylenol for mild pain, use Norco for more severe pain on. Norco may cause drowsiness, please use this medication with caution. Please use Amoxil 3 times daily with food. Please see your primary physician, or return to the emergency department for additional evaluation if not improving. Pharyngitis Pharyngitis is a sore throat (pharynx). There is redness, pain, and swelling of your throat. HOME CARE   Drink enough fluids to keep your pee (urine) clear or pale yellow.  Only take medicine as told by your doctor.  You may get sick again if you do not take medicine as told. Finish your medicines, even if you start to feel better.  Do not take aspirin.  Rest.  Rinse your mouth (gargle) with salt water ( tsp of salt per 1 qt of water) every 1-2 hours. This will help the pain.  If you are not at risk for choking, you can suck on hard candy or sore throat lozenges. GET HELP IF:  You have large, tender lumps on your neck.  You have a rash.  You cough up green, yellow-brown, or bloody spit. GET HELP RIGHT AWAY IF:   You have a stiff neck.  You drool or cannot swallow liquids.  You throw up (vomit) or are not able to keep medicine or liquids down.  You have very bad pain that does not go away with medicine.  You have problems breathing (not from a stuffy nose). MAKE SURE YOU:   Understand these instructions.  Will watch your condition.  Will get help right away if you are not doing well or get worse. Document Released: 01/08/2008 Document Revised: 05/12/2013 Document Reviewed: 03/29/2013 [facility] Patient Information 2015 [place], [place]. This information is not intended to replace advice given to you by your health care provider. Make sure you discuss any questions you have with your health care provider.  Dental Caries Dental caries is tooth decay. This decay can cause a hole in teeth (cavity) that can get bigger and deeper over  time. HOME CARE  Brush and floss your teeth. Do this at least two times a day.  Use a fluoride toothpaste.  Use a mouth rinse if told by your dentist or doctor.  Eat less sugary and starchy foods. Drink less sugary drinks.  Avoid snacking often on sugary and starchy foods. Avoid sipping often on sugary drinks.  Keep regular checkups and cleanings with your dentist.  Use fluoride supplements if told by your dentist or doctor.  Allow fluoride to be applied to teeth if told by your dentist or doctor. Document Released: 04/30/2008 Document Revised: 12/06/2013 Document Reviewed: 07/24/2012 [facility] Patient Information 2015 , [place]. This information is not intended to replace advice given to you by your health care provider. Make sure you discuss any questions you have with your health care provider.

## 2014-11-09 ENCOUNTER — Other Ambulatory Visit (HOSPITAL_COMMUNITY): Payer: Self-pay | Admitting: Physician Assistant

## 2014-11-09 DIAGNOSIS — Z1231 Encounter for screening mammogram for malignant neoplasm of breast: Secondary | ICD-10-CM

## 2014-12-16 ENCOUNTER — Inpatient Hospital Stay (HOSPITAL_COMMUNITY): Admission: RE | Admit: 2014-12-16 | Payer: Self-pay | Source: Ambulatory Visit

## 2015-01-04 ENCOUNTER — Ambulatory Visit (HOSPITAL_COMMUNITY): Payer: Self-pay

## 2015-01-30 IMAGING — US US TRANSVAGINAL NON-OB
1 series · 13 of 25 positions shown · non-contrast
Comparison: CT [DATE]

CLINICAL DATA: Right pelvic pain for 2 days.  Question torsion.

EXAM:
TRANSABDOMINAL AND TRANSVAGINAL ULTRASOUND OF PELVIS
DOPPLER ULTRASOUND OF OVARIES
TECHNIQUE: Both transabdominal and transvaginal ultrasound examinations of the
pelvis were performed. Transabdominal technique was performed for
global imaging of the pelvis including uterus, ovaries, adnexal
regions, and pelvic cul-de-sac.
It was necessary to proceed with endovaginal exam following the
transabdominal exam to visualize the uterus, endometrium, ovaries
and adnexa . Color and duplex Doppler ultrasound was utilized to
evaluate blood flow to the ovaries.

[Series 1: us transvaginal non-ob · 0.21mm/px · 13 of 105 slices shown]
[im 1/105]
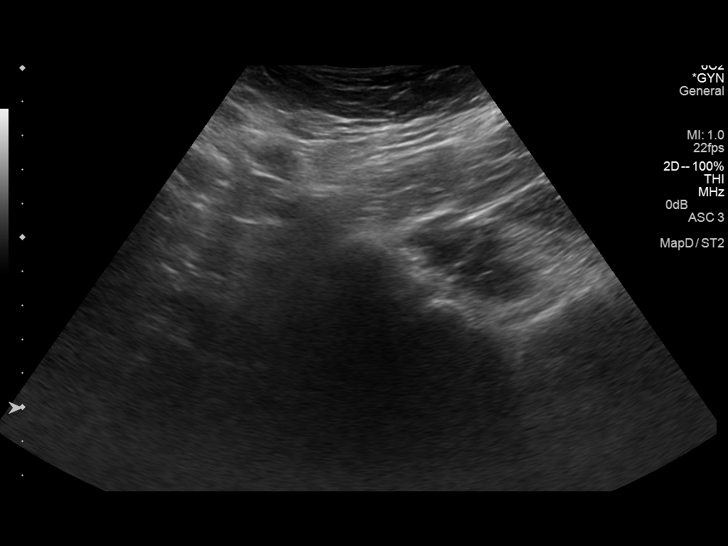
[im 9/105]
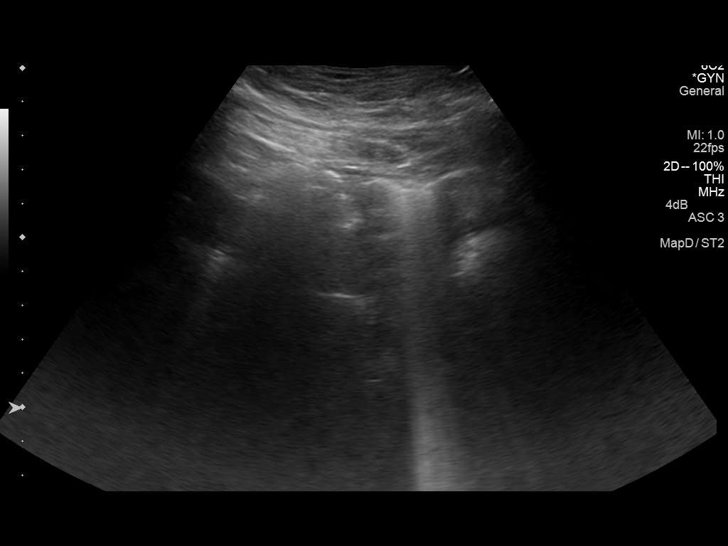
[im 18/105]
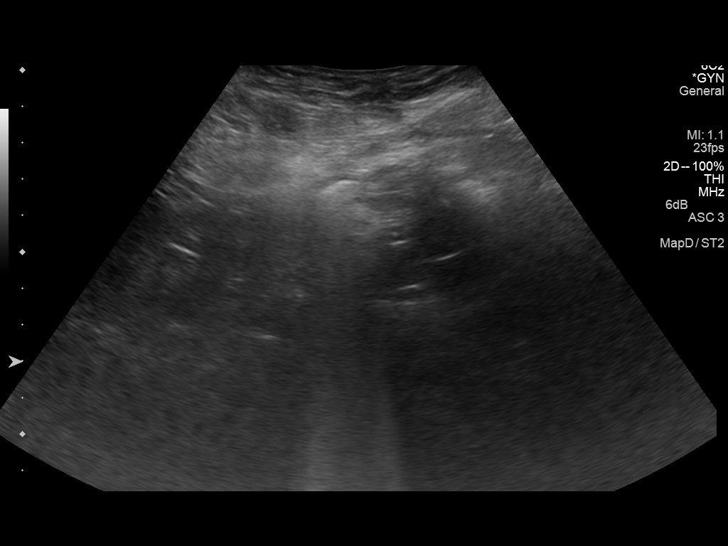
[im 27/105]
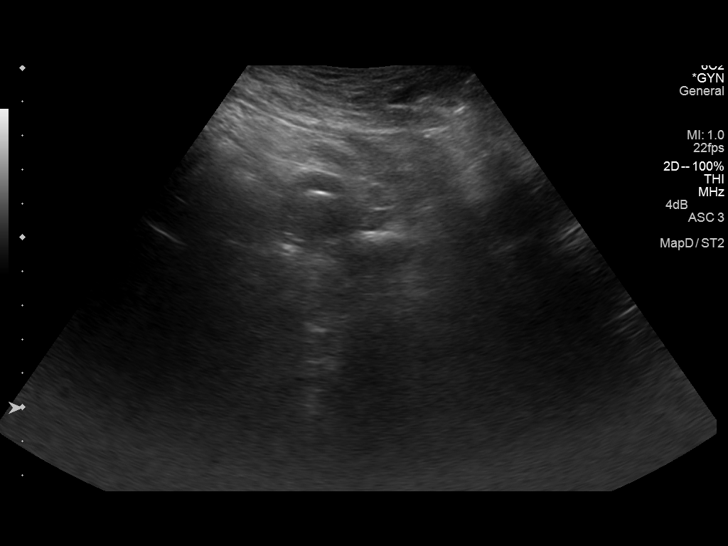
[im 35/105]
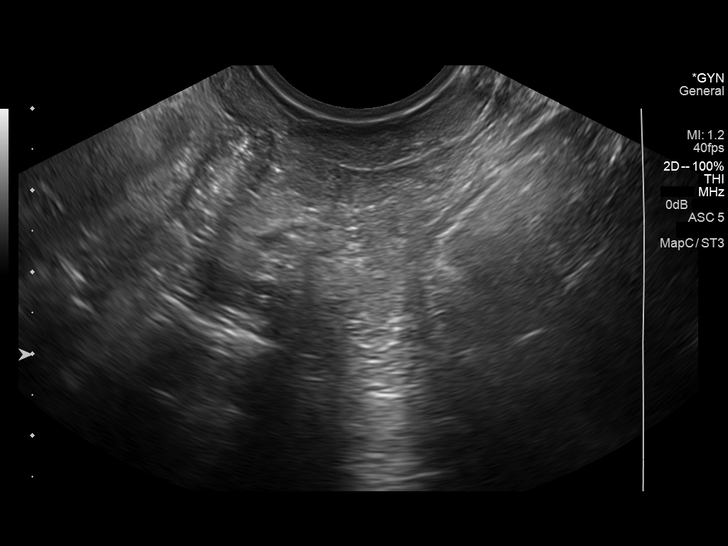
[im 44/105]
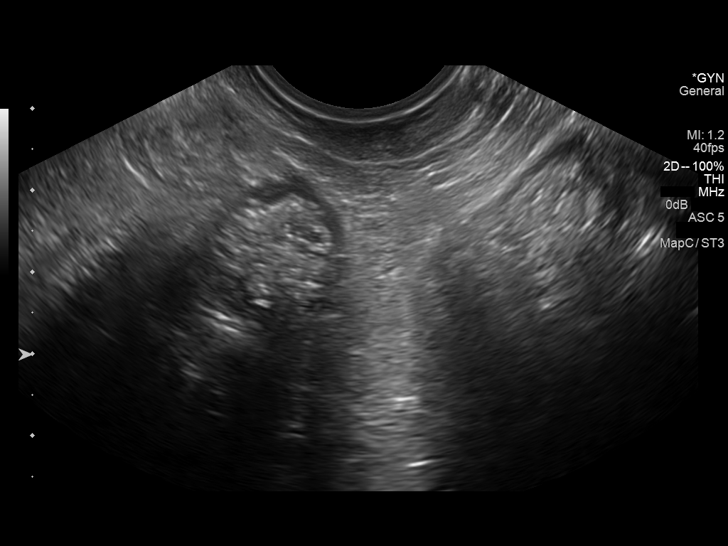
[im 53/105]
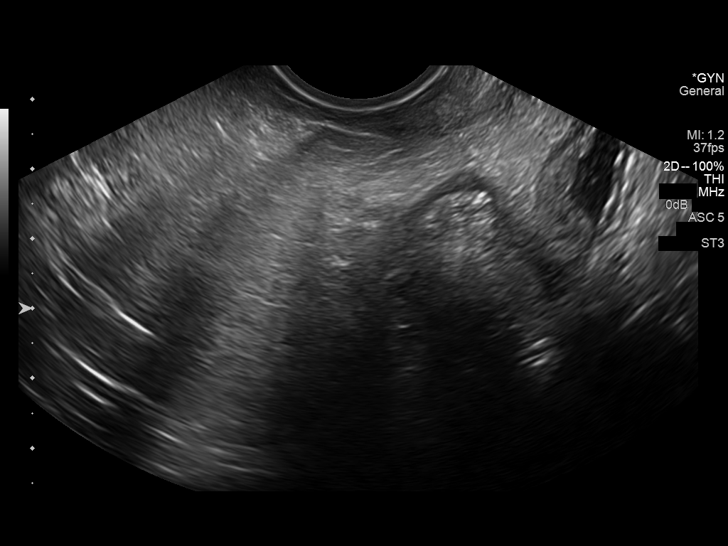
[im 61/105]
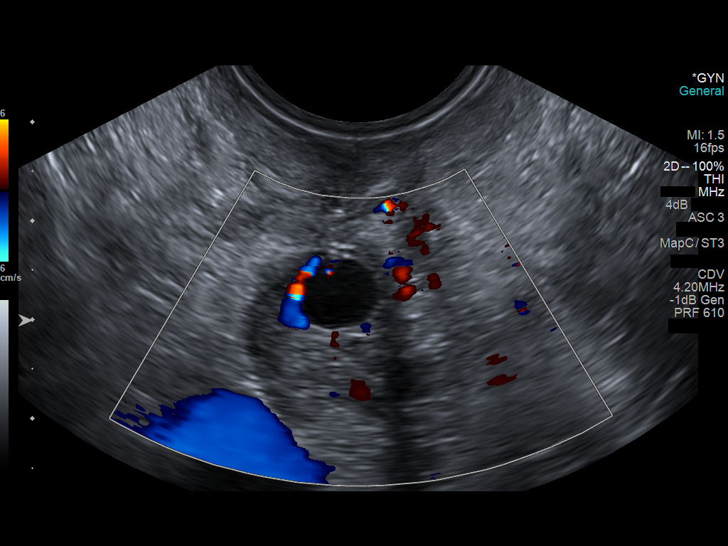
[im 70/105]
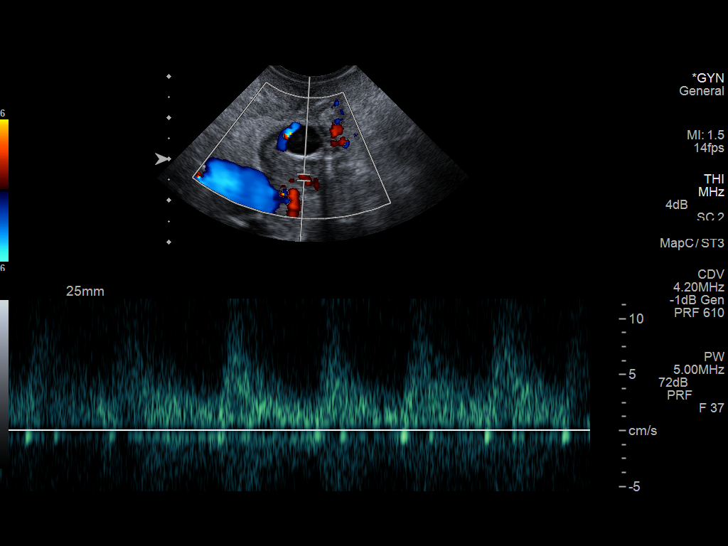
[im 79/105]
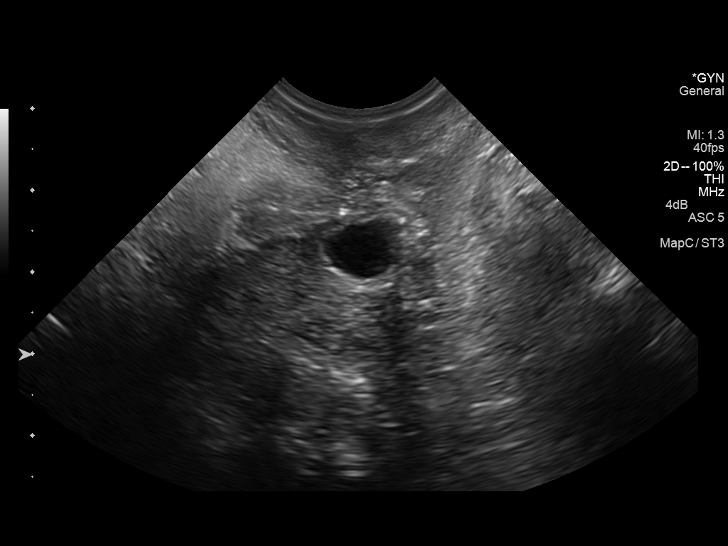
[im 87/105]
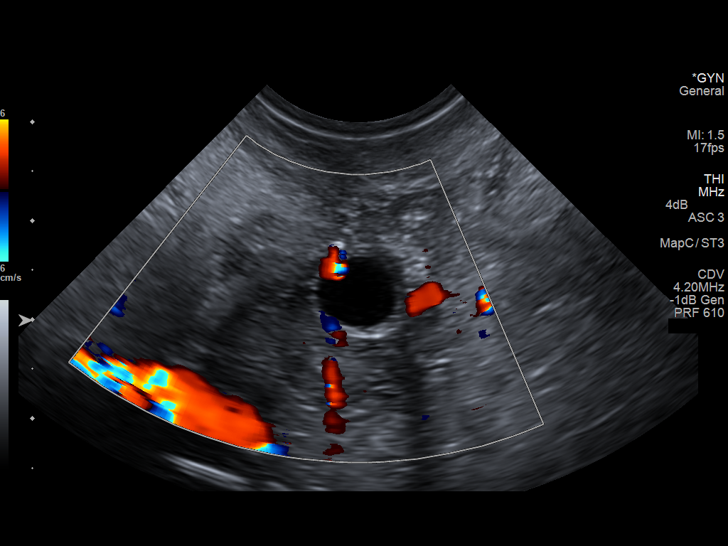
[im 96/105]
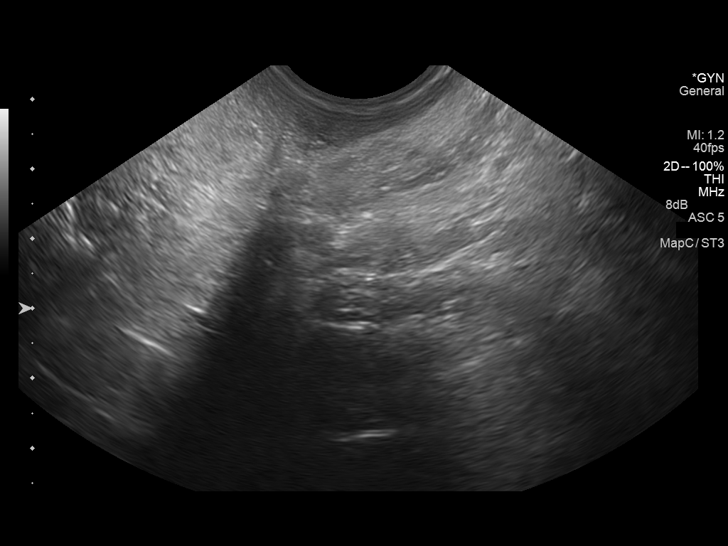
[im 105/105]
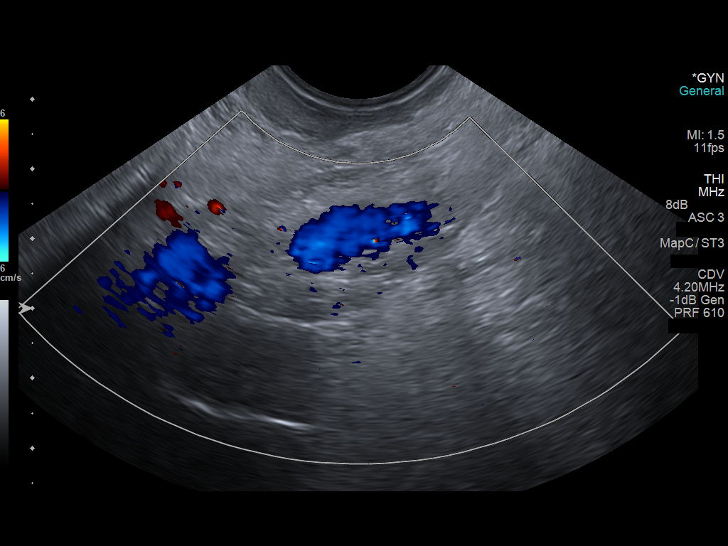

[13 of 25 positions shown; findings below may reference images not displayed]

FINDINGS: Uterus

Measurements: Prior hysterectomy.

Endometrium

Thickness: N/A.

Right ovary

Measurements: 3.3 x 1.9 x 2.3 cm. Small follicle within the ovary.

Left ovary

Measurements: Not visualized due to overlying bowel gas. No adnexal
masses seen..

Pulsed Doppler evaluation demonstrates normal low-resistance
arterial and venous waveforms within the right ovary. Left ovary not
visualized to assess Doppler flow.

Other findings

No free fluid.
IMPRESSION: No abnormality visualized. Normal appearance of the right ovary.
Prior hysterectomy. Left ovary not visualized due to overlying bowel
gas.

## 2015-03-03 ENCOUNTER — Emergency Department (HOSPITAL_COMMUNITY): Payer: Self-pay

## 2015-03-03 ENCOUNTER — Emergency Department (HOSPITAL_COMMUNITY)
Admission: EM | Admit: 2015-03-03 | Discharge: 2015-03-03 | Disposition: A | Discharge status: Home / Self Care | Payer: Self-pay | Attending: Emergency Medicine | Admitting: Emergency Medicine

## 2015-03-03 ENCOUNTER — Encounter (HOSPITAL_COMMUNITY): Payer: Self-pay | Admitting: Emergency Medicine

## 2015-03-03 DIAGNOSIS — E119 Type 2 diabetes mellitus without complications: Secondary | ICD-10-CM | POA: Insufficient documentation

## 2015-03-03 DIAGNOSIS — M1990 Unspecified osteoarthritis, unspecified site: Secondary | ICD-10-CM | POA: Insufficient documentation

## 2015-03-03 DIAGNOSIS — R1031 Right lower quadrant pain: Secondary | ICD-10-CM

## 2015-03-03 DIAGNOSIS — Z8742 Personal history of other diseases of the female genital tract: Secondary | ICD-10-CM | POA: Insufficient documentation

## 2015-03-03 DIAGNOSIS — Z720 Tobacco use: Secondary | ICD-10-CM | POA: Insufficient documentation

## 2015-03-03 DIAGNOSIS — Z90710 Acquired absence of both cervix and uterus: Secondary | ICD-10-CM | POA: Insufficient documentation

## 2015-03-03 DIAGNOSIS — Z9851 Tubal ligation status: Secondary | ICD-10-CM | POA: Insufficient documentation

## 2015-03-03 DIAGNOSIS — R102 Pelvic and perineal pain: Secondary | ICD-10-CM | POA: Insufficient documentation

## 2015-03-03 DIAGNOSIS — IMO0001 Reserved for inherently not codable concepts without codable children: Secondary | ICD-10-CM

## 2015-03-03 DIAGNOSIS — I509 Heart failure, unspecified: Secondary | ICD-10-CM | POA: Insufficient documentation

## 2015-03-03 DIAGNOSIS — K219 Gastro-esophageal reflux disease without esophagitis: Secondary | ICD-10-CM | POA: Insufficient documentation

## 2015-03-03 DIAGNOSIS — J449 Chronic obstructive pulmonary disease, unspecified: Secondary | ICD-10-CM | POA: Insufficient documentation

## 2015-03-03 DIAGNOSIS — I10 Essential (primary) hypertension: Secondary | ICD-10-CM | POA: Insufficient documentation

## 2015-03-03 DIAGNOSIS — G8929 Other chronic pain: Secondary | ICD-10-CM | POA: Insufficient documentation

## 2015-03-03 DIAGNOSIS — Z9049 Acquired absence of other specified parts of digestive tract: Secondary | ICD-10-CM | POA: Insufficient documentation

## 2015-03-03 DIAGNOSIS — Z792 Long term (current) use of antibiotics: Secondary | ICD-10-CM | POA: Insufficient documentation

## 2015-03-03 DIAGNOSIS — Z79899 Other long term (current) drug therapy: Secondary | ICD-10-CM | POA: Insufficient documentation

## 2015-03-03 DIAGNOSIS — L03012 Cellulitis of left finger: Secondary | ICD-10-CM | POA: Insufficient documentation

## 2015-03-03 HISTORY — DX: Unspecified ovarian cyst, unspecified side: N83.209

## 2015-03-03 LAB — WET PREP, GENITAL
Clue Cells Wet Prep HPF POC: NONE SEEN
Trich, Wet Prep: NONE SEEN
Yeast Wet Prep HPF POC: NONE SEEN

## 2015-03-03 LAB — URINALYSIS, ROUTINE W REFLEX MICROSCOPIC
Bilirubin Urine: NEGATIVE
Glucose, UA: >1000 mg/dL — AB
Hgb urine dipstick: NEGATIVE
Ketones, ur: NEGATIVE mg/dL
Leukocytes, UA: NEGATIVE
Nitrite: NEGATIVE
Protein, ur: NEGATIVE mg/dL
Specific Gravity, Urine: >1.03 — ABNORMAL HIGH (ref 1.005–1.030)
Urobilinogen, UA: 0.2 mg/dL (ref 0.0–1.0)
pH: 5.5 (ref 5.0–8.0)

## 2015-03-03 LAB — URINE MICROSCOPIC-ADD ON

## 2015-03-03 IMAGING — CT CT ABD-PELV W/O CM
2 of 4 series · 17 of 46 positions shown, 19 images · non-contrast
Comparison: [DATE] and prior CTs

CLINICAL DATA: 48-year-old female with right-sided abdominal and
pelvic pain for 2 days.

EXAM:
CT ABDOMEN AND PELVIS WITHOUT CONTRAST
TECHNIQUE: Multidetector CT imaging of the abdomen and pelvis was performed
following the standard protocol without IV contrast.

[Series 2: abdomen/pelvis w/o contrast · axial · non-contrast · 0.89mm/px · z∈[-448,-38]mm · 14 of 90 slices shown, 16 images]
[im 4/90  soft-tissue]
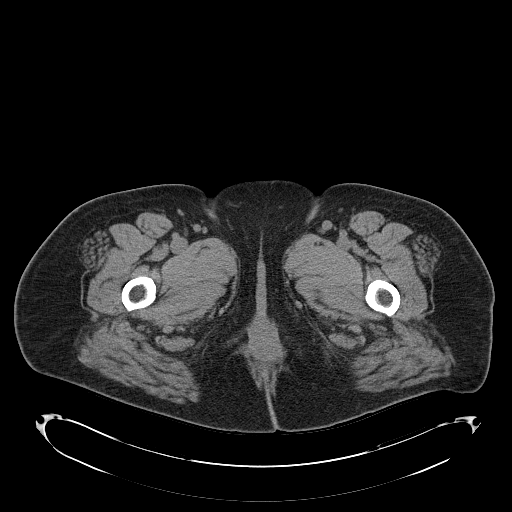
[im 4/90  bone]
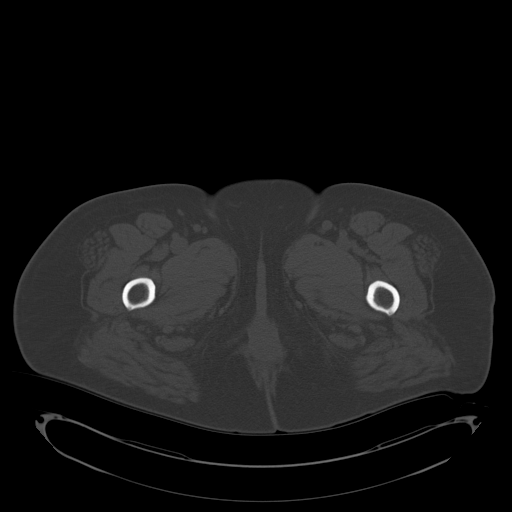
[im 12/90  soft-tissue]
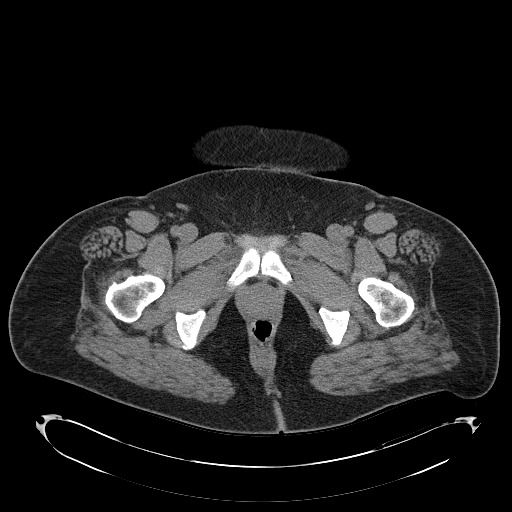
[im 19/90  soft-tissue]
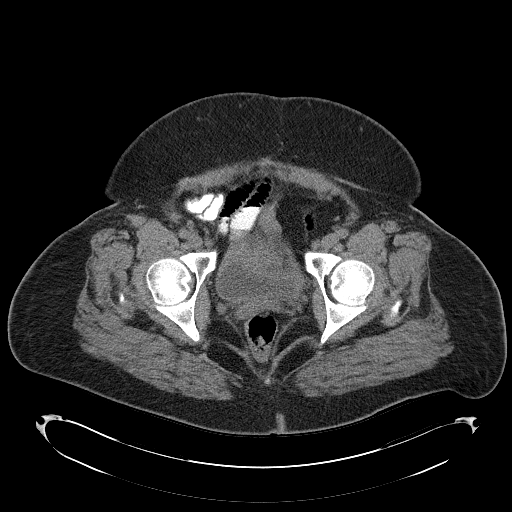
[im 23/90  soft-tissue]
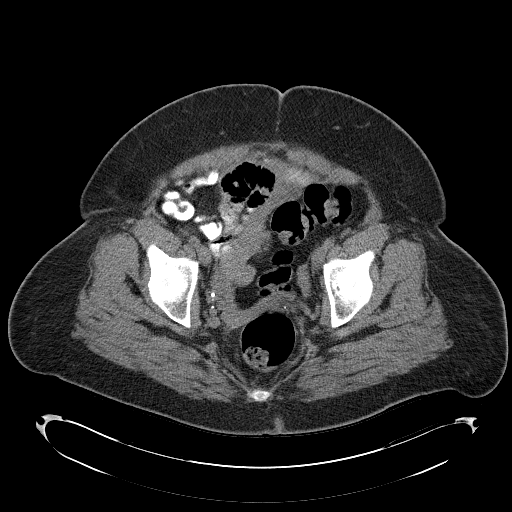
[im 30/90  soft-tissue]
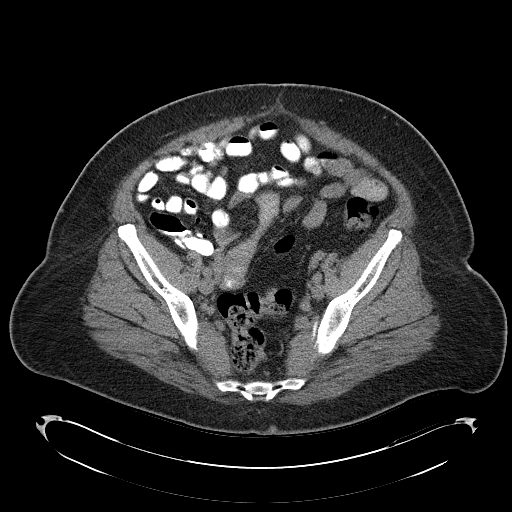
[im 38/90  soft-tissue]
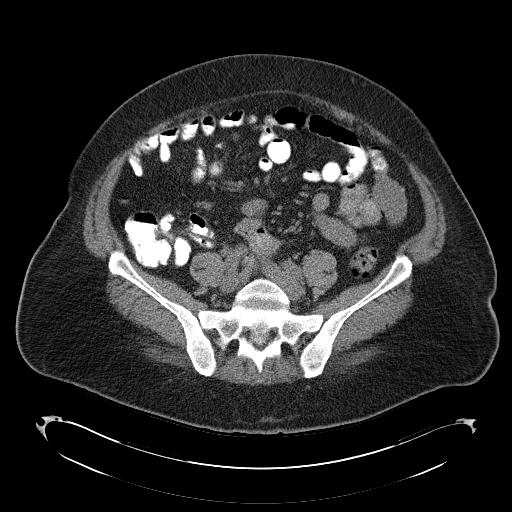
[im 41/90  soft-tissue]
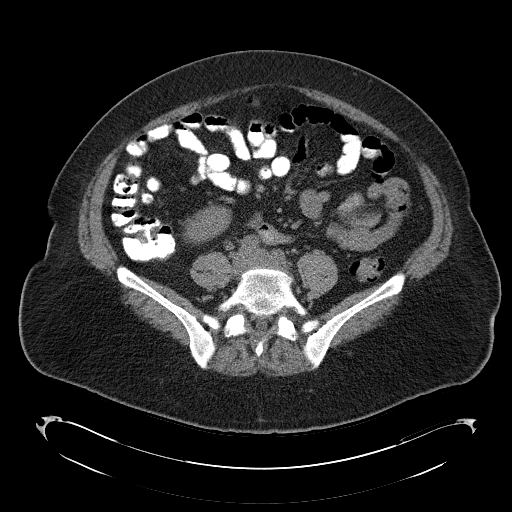
[im 49/90  soft-tissue]
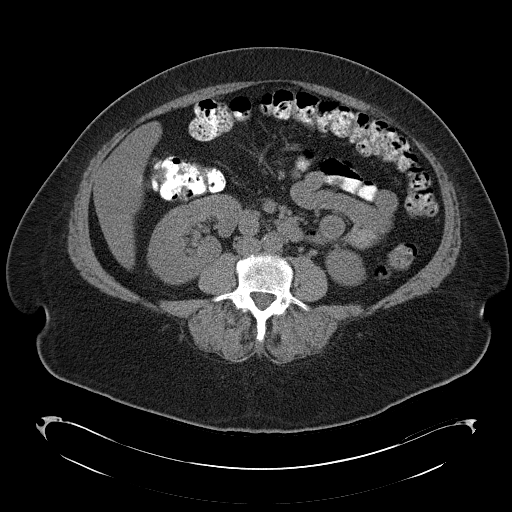
[im 52/90  soft-tissue]
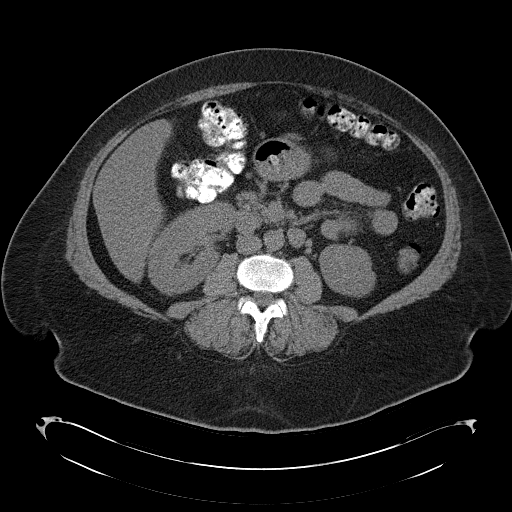
[im 52/90  bone]
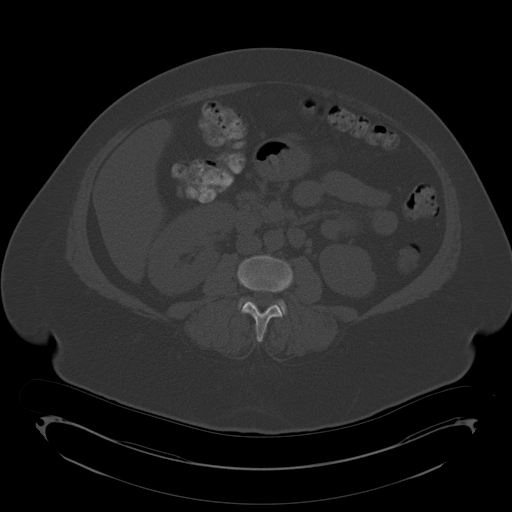
[im 60/90  soft-tissue]
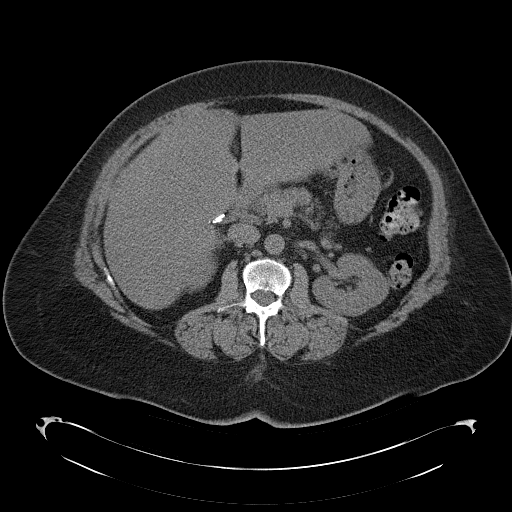
[im 67/90  soft-tissue]
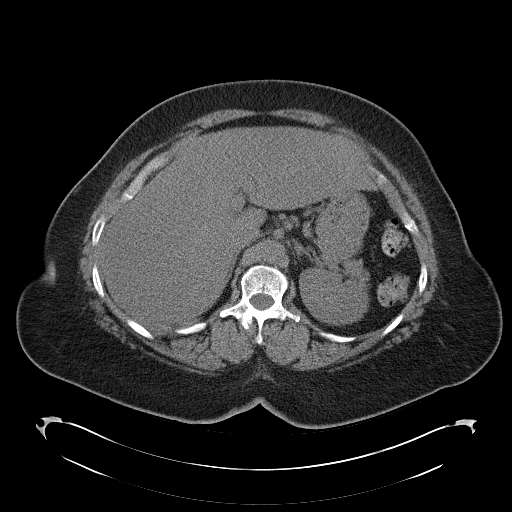
[im 71/90  soft-tissue]
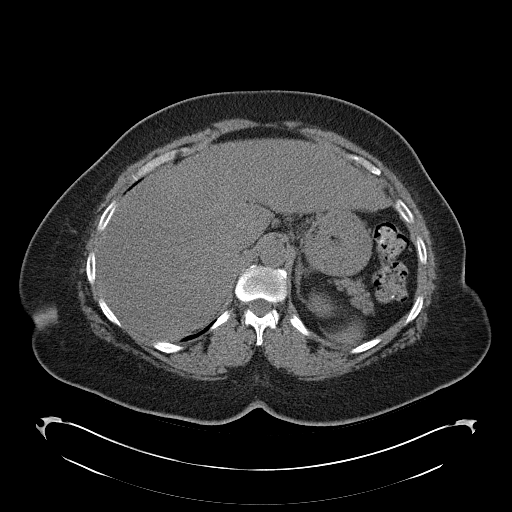
[im 78/90  soft-tissue]
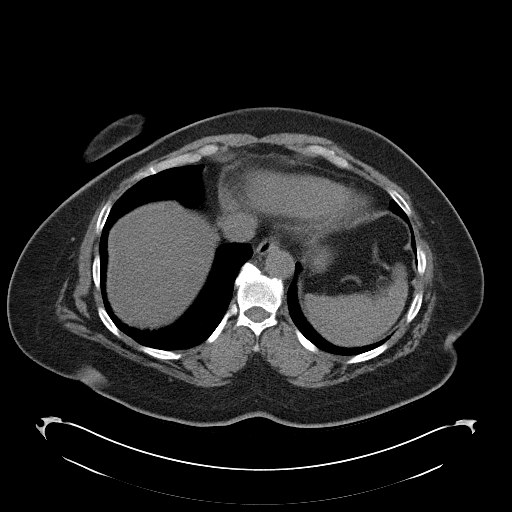
[im 86/90  soft-tissue]
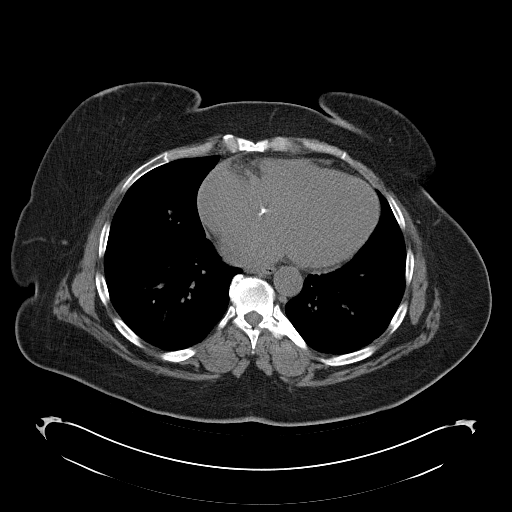

[Series 3: mpr cor (id) · coronal · 0.88mm/px · 3 of 122 slices shown]
[im 41/122  soft-tissue]
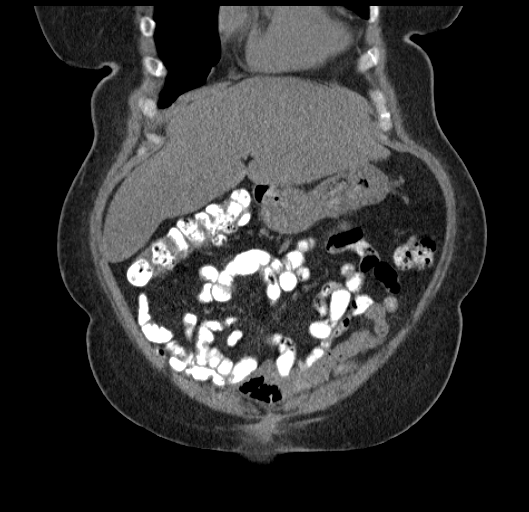
[im 54/122  soft-tissue]
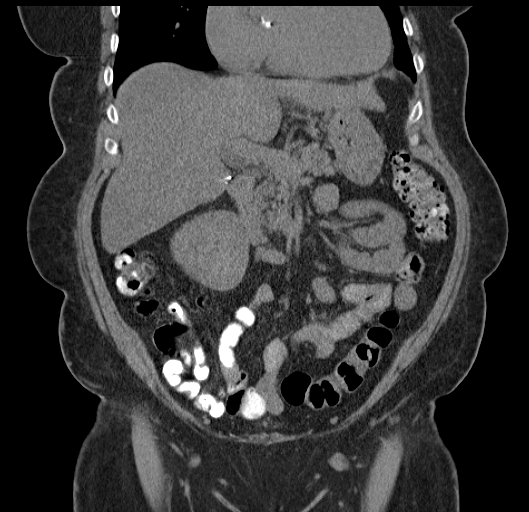
[im 68/122  soft-tissue]
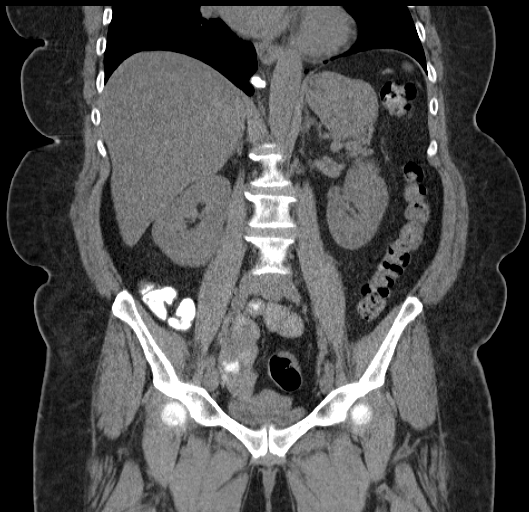

[17 of 46 positions shown; findings below may reference images not displayed]

FINDINGS: Please note that parenchymal abnormalities may be missed without
intravenous contrast.

Lower chest:  Mild cardiomegaly noted.

Hepatobiliary: Hepatic steatosis noted. The patient is status post
cholecystectomy. There is no evidence of biliary dilatation.

Pancreas: Unremarkable

Spleen: Unremarkable

Adrenals/Urinary Tract: The kidneys, adrenal glands and bladder are
unremarkable.

Stomach/Bowel: Unremarkable. There is no evidence of bowel
obstruction or bowel wall thickening. The appendix is normal.

Vascular/Lymphatic: Upper limits of normal retroperitoneal lymph
nodes are unchanged. No suspicious lymph nodes or abdominal aortic
aneurysm identified. Aortic atherosclerotic calcifications are
present.

Reproductive: The patient is status post hysterectomy. There is no
evidence of adnexal mass.

Other: No free fluid, abscess or pneumoperitoneum.

Musculoskeletal: No acute or suspicious abnormalities.
IMPRESSION: No acute abnormality.

Hepatic steatosis.

Mild cardiomegaly and aortic atherosclerotic disease.

## 2015-03-03 MED ORDER — TRAMADOL HCL 50 MG PO TABS: 50.0000 mg | ORAL_TABLET | Freq: Four times a day (QID) | ORAL | Status: DC | PRN

## 2015-03-03 MED ORDER — IOHEXOL 300 MG/ML  SOLN: 100.0000 mL | Freq: Once | INTRAMUSCULAR | Status: DC | PRN

## 2015-03-03 MED ORDER — IOHEXOL 300 MG/ML  SOLN
50.0000 mL | Freq: Once | INTRAMUSCULAR | Status: AC | PRN
  Administered 2015-03-03: 50 mL via ORAL

## 2015-03-03 MED ORDER — MORPHINE SULFATE 4 MG/ML IJ SOLN: 4.0000 mg | Freq: Once | INTRAMUSCULAR | Status: DC

## 2015-03-03 MED ORDER — OXYCODONE-ACETAMINOPHEN 5-325 MG PO TABS
1.0000 | ORAL_TABLET | Freq: Once | ORAL | Status: AC
  Administered 2015-03-03: 1 via ORAL
  Filled 2015-03-03: qty 1

## 2015-03-03 MED ORDER — LIDOCAINE HCL (PF) 2 % IJ SOLN
2.0000 mL | Freq: Once | INTRAMUSCULAR | Status: DC
  Filled 2015-03-03: qty 10

## 2015-03-03 NOTE — ED Provider Notes (Signed)
CSN: [id number]     Arrival date & time 03/03/15  1259 History   First MD Initiated Contact with Patient 03/03/15 1344     Chief Complaint  Patient presents with  . Pelvic Pain  . Hand Pain      HPI Pt was seen at 1350. Per pt, c/o gradual onset and persistence of constant right sided pelvic "pain" for the past 2 days. Pt states she has hx of ovarian cysts and "this feels like it." Pt also c/o left ring finger "swelling around the nail" that began 2 days ago. Denies back/flank pain, no dysuria/hematuria, no vaginal bleeding/discharge, no CP/SOB, no fevers, no rash.    Past Medical History  Diagnosis Date  . Type 2 diabetes mellitus   . Essential hypertension, benign   . GERD (gastroesophageal reflux disease)   . COPD (chronic obstructive pulmonary disease)   . Arthritis   . Normal coronary arteries     3/10 - following abnormal Myoview  . Diabetes mellitus   . CHF (congestive heart failure)   . Chronic abdominal pain   . Ovarian cyst    Past Surgical History  Procedure Laterality Date  . Tubal ligation    . Cholecystectomy    . Cesarean section    . Abdominal hysterectomy  09/10/2011    Procedure: HYSTERECTOMY ABDOMINAL;  Surgeon: Jonnie Kind, MD;  Location: [facility];  Service: Gynecology;  Laterality: N/A;  Abdominal hysterectomy  . Scar revision  09/10/2011    Procedure: SCAR REVISION;  Surgeon: Jonnie Kind, MD;  Location: [facility];  Service: Gynecology;  Laterality: N/A;  Wide Excision of old Cicatrix   Family History  Problem Relation Age of Onset  . Cirrhosis Mother   . Diabetes type II Father   . Diabetes type II Sister   . Anesthesia problems Neg Hx   . Hypotension Neg Hx   . Malignant hyperthermia Neg Hx   . Pseudochol deficiency Neg Hx    History  Substance Use Topics  . Smoking status: Current Every Day Smoker -- 0.25 packs/day for 29 years    Types: Cigarettes  . Smokeless tobacco: Never Used     Comment: 1 cigarette a day  . Alcohol Use: No     Review of Systems ROS: Statement: All systems negative except as marked or noted in the HPI; Constitutional: Negative for fever and chills. ; ; Eyes: Negative for eye pain, redness and discharge. ; ; ENMT: Negative for ear pain, hoarseness, nasal congestion, sinus pressure and sore throat. ; ; Cardiovascular: Negative for chest pain, palpitations, diaphoresis, dyspnea and peripheral edema. ; ; Respiratory: Negative for cough, wheezing and stridor. ; ; Gastrointestinal: Negative for nausea, vomiting, diarrhea, blood in stool, hematemesis, jaundice and rectal bleeding. . ; ; Genitourinary: Negative for dysuria, flank pain and hematuria. ; ; GYN:  +pelvic pain. No vaginal bleeding, no vaginal discharge, no vulvar pain. ;; Musculoskeletal: +paronychia left ring finger. Negative for back pain and neck pain. Negative for swelling and trauma.; ; Skin: Negative for pruritus, rash, abrasions, blisters, bruising and skin lesion.; ; Neuro: Negative for headache, lightheadedness and neck stiffness. Negative for weakness, altered level of consciousness , altered mental status, extremity weakness, paresthesias, involuntary movement, seizure and syncope.      Allergies  Bee venom and Naproxen  Home Medications   Prior to Admission medications   Medication Sig Start Date End Date Taking? Authorizing Provider  albuterol (PROVENTIL HFA;VENTOLIN HFA) 108 (90 BASE) MCG/ACT inhaler Inhale 1-2  puffs into the lungs every 6 (six) hours as needed for wheezing. 08/25/12   Nat Christen, MD  amoxicillin (AMOXIL) 500 MG capsule Take 1 capsule (500 mg total) by mouth 3 (three) times daily. 10/24/14   Lily Kocher, PA-C  benazepril-hydrochlorthiazide (LOTENSIN HCT) 10-12.5 MG per tablet Take 1 tablet by mouth daily.    Historical Provider, MD  HYDROcodone-acetaminophen (NORCO/VICODIN) 5-325 MG per tablet Take 1 tablet by mouth every 4 (four) hours as needed. 10/24/14   Lily Kocher, PA-C  ibuprofen (ADVIL,MOTRIN) 200 MG tablet  Take 400-800 mg by mouth every 6 (six) hours as needed for moderate pain.     Historical Provider, MD  metFORMIN (GLUCOPHAGE) 1000 MG tablet Take 1,000 mg by mouth 2 (two) times daily with a meal.    Historical Provider, MD  omeprazole (PRILOSEC) 20 MG capsule Take 1 capsule (20 mg total) by mouth daily. 08/16/14   Francine Graven, DO  ondansetron (ZOFRAN) 4 MG tablet Take 1 tablet (4 mg total) by mouth every 8 (eight) hours as needed for nausea or vomiting. Patient not taking: Reported on 10/24/2014 08/16/14   Francine Graven, DO   BP 162/79 mmHg  Pulse 80  Temp(Src) 98.6 F (37 C) (Oral)  Resp 18  Ht 5\' 6"  (1.676 m)  Wt 218 lb (98.884 kg)  BMI 35.20 kg/m2  SpO2 100%  LMP 08/21/2011 Physical Exam  1355: Physical examination:  Nursing notes reviewed; Vital signs and O2 SAT reviewed;  Constitutional: Well developed, Well nourished, Well hydrated, In no acute distress; Head:  Normocephalic, atraumatic; Eyes: EOMI, PERRL, No scleral icterus; ENMT: Mouth and pharynx normal, Mucous membranes moist; Neck: Supple, Full range of motion, No lymphadenopathy; Cardiovascular: Regular rate and rhythm, No gallop; Respiratory: Breath sounds clear & equal bilaterally, No wheezes.  Speaking full sentences with ease, Normal respiratory effort/excursion; Chest: Nontender, Movement normal; Abdomen: Soft, +right pelvic tenderness to palp. Nondistended, Normal bowel sounds; Genitourinary: No CVA tenderness. Pelvic exam performed with permission of pt and female ED tech assist during exam.  External genitalia w/o lesions. Vaginal vault with copious thin white discharge.  Cervix w/o lesions, not friable, GC/chlam and wet prep obtained and sent to lab.  Bimanual exam w/o CMT, uterine or left adnexal tenderness; +right pelvic tenderness.; Extremities: Pulses normal, No deformity. +paronychia left ring finger, pulp NT, no open wounds.  No calf edema or asymmetry.; Neuro: AA&Ox3, Major CN grossly intact.  Speech clear. No  gross focal motor or sensory deficits in extremities.; Skin: Color normal, Warm, Dry.   ED Course  Procedures     EKG Interpretation None      MDM  MDM Reviewed: previous chart, nursing note and vitals Reviewed previous: ultrasound, CT scan and labs Interpretation: labs      Results for orders placed or performed during the hospital encounter of 03/03/15  Wet prep, genital  Result Value Ref Range   Yeast Wet Prep HPF POC NONE SEEN NONE SEEN   Trich, Wet Prep NONE SEEN NONE SEEN   Clue Cells Wet Prep HPF POC NONE SEEN NONE SEEN   WBC, Wet Prep HPF POC MODERATE (A) NONE SEEN  Urinalysis, Routine w reflex microscopic (not at [facility])  Result Value Ref Range   Color, Urine YELLOW YELLOW   APPearance CLEAR CLEAR   Specific Gravity, Urine >1.030 (H) 1.005 - 1.030   pH 5.5 5.0 - 8.0   Glucose, UA >1000 (A) NEGATIVE mg/dL   Hgb urine dipstick NEGATIVE NEGATIVE   Bilirubin Urine NEGATIVE NEGATIVE  Ketones, ur NEGATIVE NEGATIVE mg/dL   Protein, ur NEGATIVE NEGATIVE mg/dL   Urobilinogen, UA 0.2 0.0 - 1.0 mg/dL   Nitrite NEGATIVE NEGATIVE   Leukocytes, UA NEGATIVE NEGATIVE  Urine microscopic-add on  Result Value Ref Range   Squamous Epithelial / LPF FEW (A) RARE   RBC / HPF 0-2 <3 RBC/hpf     1600:  Paronychia drained by midlevel.  CT and [place] pending. Dispo based on results. Sign out to Dr. Wyvonnia Dusky.     Francine Graven, DO 03/06/15 1211

## 2015-03-03 NOTE — ED Notes (Signed)
Drinking PO contrast at this time

## 2015-03-03 NOTE — ED Provider Notes (Signed)
Ultrasound and CT negative. Patient stable for discharge per plan established by Dr. Thurnell Garbe  BP 186/84 mmHg  Pulse 64  Temp(Src) 98.6 F (37 C) (Oral)  Resp 15  Ht 5\' 6"  (1.676 m)  Wt 218 lb (98.884 kg)  BMI 35.20 kg/m2  SpO2 100%  LMP 08/21/2011   Ezequiel [place], MD 03/03/15 2001

## 2015-03-03 NOTE — ED Provider Notes (Signed)
Asked to I & D paronychia of left ring finger.  All other medical care provided by Dr. Thurnell Garbe.  INCISION AND DRAINAGE Performed by: Evalee Jefferson Consent: Verbal consent obtained. Risks and benefits: risks, benefits and alternatives were discussed Type: abscess  Body area: left right finger  Anesthesia: digital block  Cuticle lifted from nail plate using #62 scalpel  Local anesthetic: lidocaine 2 % without epinephrine  Anesthetic total: 2 ml  Complexity: complex Blunt dissection to break up loculations  Drainage: purulent  Drainage amount: Small   Packing material: none  Patient tolerance: Patient tolerated the procedure well with no immediate complications.  Dressing applied per RN.  Pt advised bid warm soaks. Prn f/u for any worsened sx.     Evalee Jefferson, PA-C 03/03/15 [place], DO 03/06/15 1212

## 2015-03-03 NOTE — Discharge Instructions (Signed)
°Emergency Department Resource Guide °1) Find a Doctor and Pay Out of Pocket °Although you won't have to find out who is covered by your insurance plan, it is a good idea to ask around and get recommendations. You will then need to call the office and see if the doctor you have chosen will accept you as a new patient and what types of options they offer for patients who are self-pay. Some doctors offer discounts or will set up payment plans for their patients who do not have insurance, but you will need to ask so you aren't surprised when you get to your appointment. ° °2) Contact Your Local Health Department °Not all health departments have doctors that can see patients for sick visits, but many do, so it is worth a call to see if yours does. If you don't know where your local health department is, you can check in your phone book. The CDC also has a tool to help you locate your state's health department, and many state websites also have listings of all of their local health departments. ° °3) Find a Walk-in Clinic °If your illness is not likely to be very severe or complicated, you may want to try a walk in clinic. These are popping up all over the country in pharmacies, drugstores, and shopping centers. They're usually staffed by nurse practitioners or physician assistants that have been trained to treat common illnesses and complaints. They're usually fairly quick and inexpensive. However, if you have serious medical issues or chronic medical problems, these are probably not your best option. ° °No Primary Care Doctor: °- Call [facility] at  832-8000 - they can help you locate a primary care doctor that  accepts your insurance, provides certain services, etc. °- Physician Referral Service- 1-800-533-3463 ° °Chronic Pain Problems: °Organization         Address  Phone   Notes  °[facility]  (336) 297-2271 Patients need to be referred by their primary care doctor.  ° °Medication  Assistance: °Organization         Address  Phone   Notes  °[place] [facility] [address] °[place], [place] [address] (336) 641-8030 --Must be a resident of [place] °-- Must have NO insurance coverage whatsoever (no Medicaid/ Medicare, etc.) °-- The pt. MUST have a primary care doctor that directs their care regularly and follows them in the community °  °[facility]  (866) 331-1348   °[employer]  (888) 892-1162   ° °Agencies that provide inexpensive medical care: °Organization         Address  Phone   Notes  °[facility]  (336) 832-8035   °[facility]    (336) 832-7272   °[facility] [address] °[place], [place] [address] (336) 832-4777   °[facility] [address], °[place] (336) 271-4999   °[facility]    (336) 373-0678   °[facility]    (336) 272-1050   °[facility] ° [address], [place] Phone:  (336) 832-4444, Fax:  (336) 832-4440 Hours of Operation:  9 am - 6 pm, M-F.  Also accepts Medicaid/Medicare and self-pay.  °[facility] ° [address],  Phone: (336) 832-3150, Fax: (336) 832-3151. Hours of Operation:  8:30 am - 5:30 pm, M-F.  Also accepts Medicaid and self-pay.  °[facility] [place] [address], [place] Phone: (336) 878-6027   °[facility] [address], [place], [place] (336)723-1848, Ext. 123 Mondays & Thursdays: 7-9 AM.  First 15 patients are seen on a first come, first serve basis. °  ° °Medicaid-accepting [place] Providers: ° °Organization         Address  Phone   Notes  °[facility] [address], [place] (336) 641-2100 Also accepts self-pay patients.  °[facility] [address], [place] ° (336) 856-9996   °[facility] [address], [place]  (336) 288-8857   °[facility] [address] [place] [address], [place] (336) 299-7000   °Veita Bland [address], [place]  ° (336) 373-1557 Only accepts [place] Access Medicaid patients after they have their name applied to their card.  ° °Self-Pay (no insurance) in [place]: ° °Organization         Address  Phone   Notes  °Sickle Cell Patients, [facility] [address], [place] (336) 832-1970   °[facility] [address], [place] (336) 832-4400   °[facility] ° [address], [place] (336) 992-4800   °[facility]/Dr. Osei-Bonsu ° [address] [place] [address], [place] or [address], [place] (336) 841-8500 Phone number for both [place] and [place] locations is the same.  °[facility] [address], [place] (336) 299-0000   °[facility] [address] [place] [address], [place] or [address] (336) 852-7530 °(336) 878-2260   °[facility] [address], [place] (336) 350-1642, phone; (336) 294-5005, fax Sees patients 1st and 3rd Saturday of every month.  Must not qualify for public or private insurance (i.e. Medicaid, Medicare, [place] Health Choice, Veterans' Benefits) • Household income should be no more than 200% of the poverty level •The clinic cannot treat you if you are pregnant or think you are pregnant • Sexually transmitted diseases are not treated at the clinic.  ° ° °Dental Care: °Organization         Address  Phone  Notes  °[place] [facility] [address], [place] (336) 641-6152 Accepts children up to age 21 who are enrolled in Medicaid or [place] Health Choice; pregnant women with a Medicaid card; and children who have applied for Medicaid or [place] Health Choice, but were declined, whose parents can pay a reduced fee at time of service.  °[place]  [facility] [place]  [address], [place] (336) 641-7733 Accepts children up to age 21 who are enrolled in Medicaid or [place] Health Choice; pregnant women with a Medicaid card; and children who have applied for Medicaid or [place] Health Choice, but were declined, whose parents can pay a reduced fee at time of service.  °[facility] ° [address],  (336) 641-4533 Patients are seen by appointment only. Walk-ins are not accepted. [facility] will see patients 18 years of age and older. °Monday - Tuesday (8am-5pm) °Most Wednesdays (8:30-5pm) °$30 per visit, cash only  °[facility] ° [address], [place] (336) 641-4533 Patients are seen by appointment only. Walk-ins are not accepted. [facility] will see patients 18 years of age and older. °One   Wednesday Evening (Monthly: Volunteer Based).  $30 per visit, cash only  °[facility]  (919) 537-3737 for adults; Children under age 4, call Graduate Pediatric Dentistry at ([phone]) [phone]. Children aged 4-14, please call (919) [phone] to request a pediatric application. ° Dental services are provided in all areas of dental care including fillings, crowns and bridges, complete and partial dentures, implants, gum treatment, root canals, and extractions. Preventive care is also provided. Treatment is provided to both adults and children. °Patients are selected via a lottery and there is often a waiting list. °  °[facility] [address], °[place] ° (336) 763-8833 [email or web address] °  °[facility] [address], [place], [place] (336)723-1848, Ext. 123 Second and Fourth Thursday of each month, opens at 6:30 AM; Clinic ends at 9 AM.  Patients are seen on a first-come first-served basis, and a limited number are seen during each clinic.  ° °[facility] ° [address], [place], [place] (336) 723-7904    Eligibility Requirements °You must have lived in [place], [place], or [place] counties for at least the last three months. °  You cannot be eligible for state or federal sponsored healthcare insurance, including Veterans Administration, Medicaid, or Medicare. °  You generally cannot be eligible for healthcare insurance through your employer.  °  How to apply: °Eligibility screenings are held every Tuesday and Wednesday afternoon from 1:00 pm until 4:00 pm. You do not need an appointment for the interview!  °[facility] [address], [place], [place] 336-631-2330   °[facility]  336-342-8273   °[facility]  336-703-3100   °[facility]  336-570-6415   ° °Behavioral Health Resources in the Community: °Intensive Outpatient Programs °Organization         Address  Phone  Notes  °[facility] [address], [place], [place] 336-878-6098   °[facility] [address], [place], [place] 336-832-9800   °[facility] [address], [place], [place] ° 336-882-2125   °[facility] [address],  °[place], [place] 1-800-853-5163 or 336-641-4981   °Substance Abuse Resources °Organization         Address  Phone  Notes  °[facility]  336-882-2125   °[facility]  336-784-9470   °[facility]  336-285-9073   °[facility]  336-845-3988   °Residential & Outpatient Substance Abuse Program  1-800-659-3381   °Psychological Services °Organization         Address  Phone  Notes  °[facility]  336- 832-9600   °[facility]  336- 378-7881   °[facility] [address], [place] 1-800-853-5163 or 336-641-4981   ° °Mobile Crisis Teams °Organization         Address  Phone  Notes  °[facility], Mobile Crisis Care Unit  1-877-626-1772   °Assertive °[facility] ° [address]  [place], [place] 336-834-9664   °Sharon DeEsch [address] °[place] 336-554-5454   ° °Self-Help/Support Groups °Organization         Address  Phone             Notes  °[facility]  - variety of support groups  336- 373-1402 Call for more information  °Narcotics Anonymous (NA), [facility] [address], °[place]   2 meetings at this location  ° °  Residential Treatment Programs Organization         Address  Phone  Notes  [facility] [address]    [place]  1-630-294-5683   [facility]  [address], [place] [address], [place], [place]   [place], [place] (757)296-6570 Admissions: 8am-3pm M-F  [facility] [place] [address]    [place], [place] 428-768-1157   [facility] [address] [place], [place], [place]   [facility] [address]  [place], [place]   [facility] Intensive Outpatient [address], [place], [place]   [facility] [address]  [place], [place] 1-(445)823-9310 or 727-211-2442   [facility] [address], [place], [place] Accepts Medicaid  [facility] [place] [address]  [place] 1-903-138-3102 Substance Abuse/Addiction Treatment   [facility]         Address  Phone  Notes  [facility]  (220)324-7122   Domenic Schwab, PhD [address] [place], [place]   581-507-0948 or 618-724-2672   [facility] [place], [place] (254)876-2009   [facility] [address] [place], [place] 479-430-8842 Insurance/Medicaid/sponsorship through [employer] and Families [address] [place]                                    [place], [place] (978)759-9535 [place] [address], [place] [phone]    Dr. Adele Schilder  (732)[phone]   [facility] 1) [address] [place] 2) [place] 3)  [address], [place] [phone] (772) [phone]  [phone]   [place] (670)243-8900 or 5054602416 (After Hours)      Take the prescription as directed.  Apply moist heat to the area(s) of discomfort, for 15 minutes at a time, several times per day for the next few days.  Do not fall asleep on a heating pack. Soak your left ring finger in warm water twice a day for the next week and cover with a clean/dry dressing. Call your regular OB/GYN doctor today to schedule a follow up appointment within the next week.  Return to the Emergency Department immediately if worsening.

## 2015-03-03 NOTE — ED Notes (Signed)
PT c/o RLQ abdominal pain and states hx of ovarian cyst x2 days. PT denies any fever, nausea, vomiting or diarrhea. Last BM 03/03/15. PT also c/o pain and swelling to left hand ring finger x2 days with pain under nail bed with no injury.

## 2015-03-06 LAB — GC/CHLAMYDIA PROBE AMP (~~LOC~~) NOT AT ARMC
Chlamydia: NEGATIVE
Neisseria Gonorrhea: NEGATIVE

## 2015-04-09 ENCOUNTER — Encounter (HOSPITAL_COMMUNITY): Payer: Self-pay | Admitting: Cardiology

## 2015-04-09 ENCOUNTER — Emergency Department (HOSPITAL_COMMUNITY)
Admission: EM | Admit: 2015-04-09 | Discharge: 2015-04-09 | Disposition: A | Discharge status: Home / Self Care | Payer: Self-pay | Attending: Emergency Medicine | Admitting: Emergency Medicine

## 2015-04-09 DIAGNOSIS — Z880 Allergy status to penicillin: Secondary | ICD-10-CM | POA: Insufficient documentation

## 2015-04-09 DIAGNOSIS — Z720 Tobacco use: Secondary | ICD-10-CM | POA: Insufficient documentation

## 2015-04-09 DIAGNOSIS — Z79899 Other long term (current) drug therapy: Secondary | ICD-10-CM | POA: Insufficient documentation

## 2015-04-09 DIAGNOSIS — K219 Gastro-esophageal reflux disease without esophagitis: Secondary | ICD-10-CM | POA: Insufficient documentation

## 2015-04-09 DIAGNOSIS — M1990 Unspecified osteoarthritis, unspecified site: Secondary | ICD-10-CM | POA: Insufficient documentation

## 2015-04-09 DIAGNOSIS — I10 Essential (primary) hypertension: Secondary | ICD-10-CM | POA: Insufficient documentation

## 2015-04-09 DIAGNOSIS — I509 Heart failure, unspecified: Secondary | ICD-10-CM | POA: Insufficient documentation

## 2015-04-09 DIAGNOSIS — G8929 Other chronic pain: Secondary | ICD-10-CM | POA: Insufficient documentation

## 2015-04-09 DIAGNOSIS — Z8742 Personal history of other diseases of the female genital tract: Secondary | ICD-10-CM | POA: Insufficient documentation

## 2015-04-09 DIAGNOSIS — L03012 Cellulitis of left finger: Secondary | ICD-10-CM | POA: Insufficient documentation

## 2015-04-09 DIAGNOSIS — E119 Type 2 diabetes mellitus without complications: Secondary | ICD-10-CM | POA: Insufficient documentation

## 2015-04-09 DIAGNOSIS — Z794 Long term (current) use of insulin: Secondary | ICD-10-CM | POA: Insufficient documentation

## 2015-04-09 DIAGNOSIS — J449 Chronic obstructive pulmonary disease, unspecified: Secondary | ICD-10-CM | POA: Insufficient documentation

## 2015-04-09 MED ORDER — CEPHALEXIN 500 MG PO CAPS: 500.0000 mg | ORAL_CAPSULE | Freq: Four times a day (QID) | ORAL | Status: DC

## 2015-04-09 MED ORDER — LIDOCAINE HCL (PF) 2 % IJ SOLN
10.0000 mL | Freq: Once | INTRAMUSCULAR | Status: AC
  Administered 2015-04-09: 10 mL
  Filled 2015-04-09: qty 10

## 2015-04-09 MED ORDER — OXYCODONE-ACETAMINOPHEN 5-325 MG PO TABS
1.0000 | ORAL_TABLET | Freq: Once | ORAL | Status: AC
  Administered 2015-04-09: 1 via ORAL
  Filled 2015-04-09: qty 1

## 2015-04-09 MED ORDER — POVIDONE-IODINE 10 % EX SOLN
CUTANEOUS | Status: AC
  Filled 2015-04-09: qty 118

## 2015-04-09 MED ORDER — OXYCODONE-ACETAMINOPHEN 5-325 MG PO TABS: 1.0000 | ORAL_TABLET | ORAL | Status: DC | PRN

## 2015-04-09 NOTE — ED Notes (Addendum)
Left ring finger swelling and pain since  last week.   States she had the same this summer and had to have it "cut and drained"

## 2015-04-09 NOTE — ED Provider Notes (Signed)
CSN: [id number]     Arrival date & time 04/09/15  0818 History   First MD Initiated Contact with Patient 04/09/15 [phone]     Chief Complaint  Patient presents with  . Hand Pain     (Consider location/radiation/quality/duration/timing/severity/associated sxs/prior Treatment) HPI   Robin [name] is a 49 y.o. female who presents to the Emergency Department complaining of recurrent pain and swelling to her left ring finger.  She states that she had a similar episode in July to the same area and it had to be "drained".  She states the pain improved, but then returned several days ago and is more painful and swollen this time.  She describes a throbbing, sharp sensation to the tip of her finger and radiates down to the base of her finger.  She denies numbness, discoloration of the finger or drainage.  She denies biting her fingernails, but does wear acrylic nails.     Past Medical History  Diagnosis Date  . Type 2 diabetes mellitus   . Essential hypertension, benign   . GERD (gastroesophageal reflux disease)   . COPD (chronic obstructive pulmonary disease)   . Arthritis   . Normal coronary arteries     3/10 - following abnormal Myoview  . Diabetes mellitus   . CHF (congestive heart failure)   . Chronic abdominal pain   . Ovarian cyst    Past Surgical History  Procedure Laterality Date  . Tubal ligation    . Cholecystectomy    . Cesarean section    . Abdominal hysterectomy  09/10/2011    Procedure: HYSTERECTOMY ABDOMINAL;  Surgeon: Jonnie Kind, MD;  Location: [facility];  Service: Gynecology;  Laterality: N/A;  Abdominal hysterectomy  . Scar revision  09/10/2011    Procedure: SCAR REVISION;  Surgeon: Jonnie Kind, MD;  Location: [facility];  Service: Gynecology;  Laterality: N/A;  Wide Excision of old Cicatrix   Family History  Problem Relation Age of Onset  . Cirrhosis Mother   . Diabetes type II Father   . Diabetes type II Sister   . Anesthesia problems Neg Hx   . Hypotension Neg Hx    . Malignant hyperthermia Neg Hx   . Pseudochol deficiency Neg Hx    Social History  Substance Use Topics  . Smoking status: Current Every Day Smoker -- 0.25 packs/day for 29 years    Types: Cigarettes  . Smokeless tobacco: Never Used     Comment: 1 cigarette a day  . Alcohol Use: No   OB History    Gravida Para Term Preterm AB TAB SAB Ectopic Multiple Living            3     Review of Systems  Constitutional: Negative for fever and chills.  Gastrointestinal: Negative for nausea and vomiting.  Musculoskeletal: Negative for joint swelling and arthralgias.  Skin: Positive for color change (pain left middle finger).  Neurological: Negative for dizziness, weakness and numbness.  Hematological: Negative for adenopathy.  All other systems reviewed and are negative.     Allergies  Bee venom; Naproxen; and Penicillins  Home Medications   Prior to Admission medications   Medication Sig Start Date End Date Taking? Authorizing Provider  ibuprofen (ADVIL,MOTRIN) 200 MG tablet Take 400-800 mg by mouth every 6 (six) hours as needed for moderate pain.    Yes Historical Provider, MD  Insulin Glargine (LANTUS SOLOSTAR) 100 UNIT/ML Solostar Pen Inject 15 Units into the skin at bedtime.   Yes Historical  Provider, MD  albuterol (PROVENTIL HFA;VENTOLIN HFA) 108 (90 BASE) MCG/ACT inhaler Inhale 1-2 puffs into the lungs every 6 (six) hours as needed for wheezing. 08/25/12   Nat Christen, MD  amoxicillin (AMOXIL) 500 MG capsule Take 1 capsule (500 mg total) by mouth 3 (three) times daily. Patient not taking: Reported on 03/03/2015 10/24/14   Lily Kocher, PA-C  HYDROcodone-acetaminophen (NORCO/VICODIN) 5-325 MG per tablet Take 1 tablet by mouth every 4 (four) hours as needed. Patient not taking: Reported on 03/03/2015 10/24/14   Lily Kocher, PA-C  JANUMET 50-1000 MG per tablet Take 1 tablet by mouth 2 (two) times daily. 01/11/15   Historical Provider, MD  losartan-hydrochlorothiazide (HYZAAR) 100-25  MG per tablet Take 1 tablet by mouth daily. 02/03/15   Historical Provider, MD  omeprazole (PRILOSEC) 20 MG capsule Take 1 capsule (20 mg total) by mouth daily. 08/16/14   Francine Graven, DO  ondansetron (ZOFRAN) 4 MG tablet Take 1 tablet (4 mg total) by mouth every 8 (eight) hours as needed for nausea or vomiting. Patient not taking: Reported on 10/24/2014 08/16/14   Francine Graven, DO  traMADol (ULTRAM) 50 MG tablet Take 1 tablet (50 mg total) by mouth every 6 (six) hours as needed. 03/03/15   Francine Graven, DO   BP 183/102 mmHg  Pulse 85  Temp(Src) 98.2 F (36.8 C) (Oral)  Resp 18  Ht 5\' 6"  (1.676 m)  Wt 210 lb (95.255 kg)  BMI 33.91 kg/m2  SpO2 96%  LMP 08/21/2011 Physical Exam  Constitutional: She is oriented to [place], place, [name] No distress.  HENT:  Head: Normocephalic and atraumatic.  Cardiovascular: Normal rate, regular rhythm, normal heart sounds and intact distal pulses.   No murmur heard. Pulmonary/Chest: Effort normal and breath sounds normal. No respiratory distress.  Musculoskeletal: Normal range of motion. She exhibits edema and tenderness.  Erythema and edema of the lateral tip of the left middle finger.  Does not extend to the fat pad of the finger.  No lymphangitis.    Neurological: She is alert and oriented to [place], place, and time. She exhibits normal muscle tone. Coordination normal.  Skin: Skin is warm and dry.  Nursing note and vitals reviewed.   ED Course  Procedures (including critical care time) Labs Review Labs Reviewed - No data to display     INCISION AND DRAINAGE Performed by: Hale Bogus. Consent: Verbal consent obtained. Risks and benefits: risks, benefits and alternatives were discussed Type: abscess  Body area: left ring finger  Anesthesia: digital block   Incision was made with a # 11 scalpel to lift the cuticle from nail plate  Local anesthetic: lidocaine 2 % w/o   epinephrine  Anesthetic total: 2 ml  Complexity: simple Blunt dissection to break up loculations  Drainage: purulent  Drainage amount: small  Packing material: none  Patient tolerance: Patient tolerated the procedure well with no immediate complications.    MDM   Final diagnoses:  Paronychia of finger of left hand    Pt with recurrent paronychia of the left ring finger.  No lymphangitis.  NV intact.  Pain improved.  Pt agrees to warm soaks, stop wearing acrylic nails and polish.  Advised to return if needed.     Bufford Lope 04/10/15 2034  Forde Dandy, MD 04/11/15 1134

## 2015-04-09 NOTE — ED Notes (Signed)
Discharge instructions and prescriptions given and reviewed with patient.  Patient verbalized understanding to take medications as directed and to return to ED as needed.  Patient discharged home in good condition.

## 2015-04-09 NOTE — Discharge Instructions (Signed)
Paronychia  Paronychia is an infection of the skin caused by germs. It happens by the fingernail or toenail. You can avoid it by not:  Pulling on hangnails.  Nail biting.  Thumb sucking.  Cutting fingernails and toenails too short.  Cutting the skin at the base and sides of the fingernail or toenail (cuticle). HOME CARE  Keep the fingers or toes very dry. Put rubber gloves over cotton gloves when putting hands in water.  Keep the wound clean and bandaged (dressed) as told by your doctor.  Soak the fingers or toes in warm water for 15 to 20 minutes. Soak them 3 to 4 times per day for germ infections. Fungal infections are difficult to treat. Fungal infections often require treatment for a long time.  Only take medicine as told by your doctor. GET HELP RIGHT AWAY IF:   You have redness, puffiness (swelling), or pain that gets worse.  You see yellowish-white fluid (pus) coming from the wound.  You have a fever.  You have a bad smell coming from the wound or bandage. MAKE SURE YOU:  Understand these instructions.  Will watch your condition.  Will get help if you are not doing well or get worse. Document Released: 07/10/2009 Document Revised: 10/14/2011 Document Reviewed: 07/10/2009 ExitCare Patient Information 2015 [employer]. This information is not intended to replace advice given to you by your health care provider. Make sure you discuss any questions you have with your health care provider.  

## 2015-05-28 ENCOUNTER — Encounter (HOSPITAL_COMMUNITY): Payer: Self-pay | Admitting: Emergency Medicine

## 2015-05-28 ENCOUNTER — Emergency Department (HOSPITAL_COMMUNITY)
Admission: EM | Admit: 2015-05-28 | Discharge: 2015-05-28 | Disposition: A | Discharge status: Home / Self Care | Payer: Self-pay | Attending: Emergency Medicine | Admitting: Emergency Medicine

## 2015-05-28 DIAGNOSIS — I509 Heart failure, unspecified: Secondary | ICD-10-CM | POA: Insufficient documentation

## 2015-05-28 DIAGNOSIS — Z7984 Long term (current) use of oral hypoglycemic drugs: Secondary | ICD-10-CM | POA: Insufficient documentation

## 2015-05-28 DIAGNOSIS — M10072 Idiopathic gout, left ankle and foot: Secondary | ICD-10-CM | POA: Insufficient documentation

## 2015-05-28 DIAGNOSIS — M109 Gout, unspecified: Secondary | ICD-10-CM

## 2015-05-28 DIAGNOSIS — I10 Essential (primary) hypertension: Secondary | ICD-10-CM | POA: Insufficient documentation

## 2015-05-28 DIAGNOSIS — Z8719 Personal history of other diseases of the digestive system: Secondary | ICD-10-CM | POA: Insufficient documentation

## 2015-05-28 DIAGNOSIS — Z79899 Other long term (current) drug therapy: Secondary | ICD-10-CM | POA: Insufficient documentation

## 2015-05-28 DIAGNOSIS — J449 Chronic obstructive pulmonary disease, unspecified: Secondary | ICD-10-CM | POA: Insufficient documentation

## 2015-05-28 DIAGNOSIS — Z8742 Personal history of other diseases of the female genital tract: Secondary | ICD-10-CM | POA: Insufficient documentation

## 2015-05-28 DIAGNOSIS — E119 Type 2 diabetes mellitus without complications: Secondary | ICD-10-CM | POA: Insufficient documentation

## 2015-05-28 DIAGNOSIS — M1990 Unspecified osteoarthritis, unspecified site: Secondary | ICD-10-CM | POA: Insufficient documentation

## 2015-05-28 DIAGNOSIS — F17210 Nicotine dependence, cigarettes, uncomplicated: Secondary | ICD-10-CM | POA: Insufficient documentation

## 2015-05-28 DIAGNOSIS — Z794 Long term (current) use of insulin: Secondary | ICD-10-CM | POA: Insufficient documentation

## 2015-05-28 DIAGNOSIS — Z880 Allergy status to penicillin: Secondary | ICD-10-CM | POA: Insufficient documentation

## 2015-05-28 DIAGNOSIS — G8929 Other chronic pain: Secondary | ICD-10-CM | POA: Insufficient documentation

## 2015-05-28 HISTORY — DX: Gout, unspecified: M10.9

## 2015-05-28 MED ORDER — INDOMETHACIN 25 MG PO CAPS
50.0000 mg | ORAL_CAPSULE | Freq: Once | ORAL | Status: AC
  Administered 2015-05-28: 50 mg via ORAL
  Filled 2015-05-28: qty 2

## 2015-05-28 MED ORDER — INDOMETHACIN 25 MG PO CAPS: 50.0000 mg | ORAL_CAPSULE | Freq: Three times a day (TID) | ORAL | Status: DC | PRN

## 2015-05-28 NOTE — ED Provider Notes (Signed)
CSN: [id number]     Arrival date & time 05/28/15  1737 History   First MD Initiated Contact with Patient 05/28/15 1747     Chief Complaint  Patient presents with  . Gout     (Consider location/radiation/quality/duration/timing/severity/associated sxs/prior Treatment) HPI   Robin [name] is a 49 y.o. female, pt with history of gout and DM, presents with sharp pain in her left foot starting in her toes and moving up her ankle. Pt states the pain began three days ago, is constant, and rates it 8/10. Pt has tried some home remedies with no relief. Pt also states she has had gout in this same foot over a year ago and states it feels the same. Pt denies preventative therapy and denies any other pain or complaints.     Past Medical History  Diagnosis Date  . Type 2 diabetes mellitus ([place])   . Essential hypertension, benign   . GERD (gastroesophageal reflux disease)   . COPD (chronic obstructive pulmonary disease) ([place])   . Arthritis   . Normal coronary arteries     3/10 - following abnormal Myoview  . Diabetes mellitus   . CHF (congestive heart failure) ([place])   . Chronic abdominal pain   . Ovarian cyst   . Gout    Past Surgical History  Procedure Laterality Date  . Tubal ligation    . Cholecystectomy    . Cesarean section    . Abdominal hysterectomy  09/10/2011    Procedure: HYSTERECTOMY ABDOMINAL;  Surgeon: Jonnie Kind, MD;  Location: [facility];  Service: Gynecology;  Laterality: N/A;  Abdominal hysterectomy  . Scar revision  09/10/2011    Procedure: SCAR REVISION;  Surgeon: Jonnie Kind, MD;  Location: [facility];  Service: Gynecology;  Laterality: N/A;  Wide Excision of old Cicatrix   Family History  Problem Relation Age of Onset  . Cirrhosis Mother   . Diabetes type II Father   . Diabetes type II Sister   . Anesthesia problems Neg Hx   . Hypotension Neg Hx   . Malignant hyperthermia Neg Hx   . Pseudochol deficiency Neg Hx    Social History  Substance Use Topics  .  Smoking status: Current Every Day Smoker -- 0.25 packs/day for 29 years    Types: Cigarettes  . Smokeless tobacco: Never Used     Comment: 1 cigarette a day  . Alcohol Use: 0.0 oz/week     Comment: "once in a while"   OB History    Gravida Para Term Preterm AB TAB SAB Ectopic Multiple Living            3     Review of Systems  Musculoskeletal:       Left foot pain and swelling      Allergies  Bee venom; Naproxen; and Penicillins  Home Medications   Prior to Admission medications   Medication Sig Start Date End Date Taking? Authorizing Provider  albuterol (PROVENTIL HFA;VENTOLIN HFA) 108 (90 BASE) MCG/ACT inhaler Inhale 1-2 puffs into the lungs every 6 (six) hours as needed for wheezing. 08/25/12  Yes Nat Christen, MD  ibuprofen (ADVIL,MOTRIN) 200 MG tablet Take 400 mg by mouth every 6 (six) hours as needed for moderate pain.    Yes Historical Provider, MD  Insulin Glargine (LANTUS SOLOSTAR) 100 UNIT/ML Solostar Pen Inject 15 Units into the skin at bedtime.   Yes Historical Provider, MD  losartan-hydrochlorothiazide (HYZAAR) 100-25 MG per tablet Take 1 tablet by mouth daily.  02/03/15  Yes Historical Provider, MD  omeprazole (PRILOSEC) 20 MG capsule Take 1 capsule (20 mg total) by mouth daily. Patient taking differently: Take 40 mg by mouth daily.  08/16/14  Yes Francine Graven, DO  Probiotic Product (ALIGN) 4 MG CAPS Take 4 mg by mouth daily.   Yes Historical Provider, MD  indomethacin (INDOCIN) 25 MG capsule Take 2 capsules (50 mg total) by mouth 3 (three) times daily as needed. 05/28/15   Shawn C Joy, PA-C  JANUMET 50-1000 MG per tablet Take 1 tablet by mouth 2 (two) times daily. 01/11/15   Historical Provider, MD   BP 201/95 mmHg  Pulse 72  Temp(Src) 97.5 F (36.4 C) (Oral)  Resp 16  Ht 5\' 7"  (1.702 m)  Wt 210 lb (95.255 kg)  BMI 32.88 kg/m2  SpO2 100%  LMP 08/21/2011 Physical Exam  Constitutional: She appears well-developed and well-nourished.  Cardiovascular: Normal rate  and regular rhythm.   Pulmonary/Chest: Effort normal.  Musculoskeletal: Normal range of motion.  Some swelling to left foot and toes. ROM intact. Tender to touch.  Neurological: She is alert.  No neurologic deficits.  Skin: Skin is warm and dry.  Nursing note and vitals reviewed.   ED Course  Procedures (including critical care time) Labs Review Labs Reviewed - No data to display  Imaging Review No results found. I have personally reviewed and evaluated these images and lab results as part of my medical decision-making.   EKG Interpretation None      MDM   Final diagnoses:  Acute gout of left foot, unspecified cause    [name] presents with gouty left foot pain for the last three days.  Pt shows signs of gout in her left foot. Pt to be discharged with indomethacin to be taken as needed.  Pt to follow up with PCP as needed. Pt hypertension noted. Pt states she hasn't taken her medications yet today. Pt is asymptomatic.     Lorayne Bender, PA-C 05/28/15 1827  Noemi Chapel, MD 05/30/15 [phone]

## 2015-05-28 NOTE — ED Provider Notes (Signed)
49 year old female, history of gouty arthritis intermittently presents with 3 days of pain in her left ankle and great and second toe. This is persistent, gradually worsening, associated with mild swelling, worse with ambulation. On exam she has tenderness with range motion of the ankle and the above-mentioned toes, no redness, no obvious swelling, no fever. Otherwise joints appear normal, compartments are soft, no redness or rashes. Patient likely has gout. Medications as ordered, stable for discharge, recommended nonweightbearing for 48 hours  Medical screening examination/treatment/procedure(s) were conducted as a shared visit with non-physician practitioner(s) and myself.  I personally evaluated the patient during the encounter.  Clinical Impression:   Final diagnoses:  Acute gout of left foot, unspecified cause         Noemi Chapel, MD 05/30/15 [phone]

## 2015-05-28 NOTE — ED Notes (Addendum)
Patient states "I got gout in my left foot since Thursday." Complaining of pain in left foot. No swelling noted in triage. Patient's B/P 201/95 in triage. Patient states she has a history of HTN but is not taking her medicine because she doesn't have it. States she has appointment with doctor tomorrow.

## 2015-05-28 NOTE — Discharge Instructions (Signed)
You have been seen for gout. You are being discharged with medicine to treat this. Take this medicine only as needed for resolution of the pain. Follow up with PCP as needed. Return to ED if symptoms worsen. Maintain non-weight-bearing status as much as possible.    Gout Gout is an inflammatory arthritis caused by a buildup of uric acid crystals in the joints. Uric acid is a chemical that is normally present in the blood. When the level of uric acid in the blood is too high it can form crystals that deposit in your joints and tissues. This causes joint redness, soreness, and swelling (inflammation). Repeat attacks are common. Over time, uric acid crystals can form into masses (tophi) near a joint, destroying bone and causing disfigurement. Gout is treatable and often preventable. CAUSES  The disease begins with elevated levels of uric acid in the blood. Uric acid is produced by your body when it breaks down a naturally found substance called purines. Certain foods you eat, such as meats and fish, contain high amounts of purines. Causes of an elevated uric acid level include:  Being passed down from parent to child (heredity).  Diseases that cause increased uric acid production (such as obesity, psoriasis, and certain cancers).  Excessive alcohol use.  Diet, especially diets rich in meat and seafood.  Medicines, including certain cancer-fighting medicines (chemotherapy), water pills (diuretics), and aspirin.  Chronic kidney disease. The kidneys are no longer able to remove uric acid well.  Problems with metabolism. Conditions strongly associated with gout include:  Obesity.  High blood pressure.  High cholesterol.  Diabetes. Not everyone with elevated uric acid levels gets gout. It is not understood why some people get gout and others do not. Surgery, joint injury, and eating too much of certain foods are some of the factors that can lead to gout attacks. SYMPTOMS   An attack of gout  comes on quickly. It causes intense pain with redness, swelling, and warmth in a joint.  Fever can occur.  Often, only one joint is involved. Certain joints are more commonly involved:  Base of the big toe.  Knee.  Ankle.  Wrist.  Finger. Without treatment, an attack usually goes away in a few days to weeks. Between attacks, you usually will not have symptoms, which is different from many other forms of arthritis. DIAGNOSIS  Your caregiver will suspect gout based on your symptoms and exam. In some cases, tests may be recommended. The tests may include:  Blood tests.  Urine tests.  X-rays.  Joint fluid exam. This exam requires a needle to remove fluid from the joint (arthrocentesis). Using a microscope, gout is confirmed when uric acid crystals are seen in the joint fluid. TREATMENT  There are two phases to gout treatment: treating the sudden onset (acute) attack and preventing attacks (prophylaxis).  Treatment of an Acute Attack.  Medicines are used. These include anti-inflammatory medicines or steroid medicines.  An injection of steroid medicine into the affected joint is sometimes necessary.  The painful joint is rested. Movement can worsen the arthritis.  You may use warm or cold treatments on painful joints, depending which works best for you.  Treatment to Prevent Attacks.  If you suffer from frequent gout attacks, your caregiver may advise preventive medicine. These medicines are started after the acute attack subsides. These medicines either help your kidneys eliminate uric acid from your body or decrease your uric acid production. You may need to stay on these medicines for a very long  time.  The early phase of treatment with preventive medicine can be associated with an increase in acute gout attacks. For this reason, during the first few months of treatment, your caregiver may also advise you to take medicines usually used for acute gout treatment. Be sure you  understand your caregiver's directions. Your caregiver may make several adjustments to your medicine dose before these medicines are effective.  Discuss dietary treatment with your caregiver or dietitian. Alcohol and drinks high in sugar and fructose and foods such as meat, poultry, and seafood can increase uric acid levels. Your caregiver or dietitian can advise you on drinks and foods that should be limited. HOME CARE INSTRUCTIONS   Do not take aspirin to relieve pain. This raises uric acid levels.  Only take over-the-counter or prescription medicines for pain, discomfort, or fever as directed by your caregiver.  Rest the joint as much as possible. When in bed, keep sheets and blankets off painful areas.  Keep the affected joint raised (elevated).  Apply warm or cold treatments to painful joints. Use of warm or cold treatments depends on which works best for you.  Use crutches if the painful joint is in your leg.  Drink enough fluids to keep your urine clear or pale yellow. This helps your body get rid of uric acid. Limit alcohol, sugary drinks, and fructose drinks.  Follow your dietary instructions. Pay careful attention to the amount of protein you eat. Your daily diet should emphasize fruits, vegetables, whole grains, and fat-free or low-fat milk products. Discuss the use of coffee, vitamin C, and cherries with your caregiver or dietitian. These may be helpful in lowering uric acid levels.  Maintain a healthy body weight. SEEK MEDICAL CARE IF:   You develop diarrhea, vomiting, or any side effects from medicines.  You do not feel better in 24 hours, or you are getting worse. SEEK IMMEDIATE MEDICAL CARE IF:   Your joint becomes suddenly more tender, and you have chills or a fever. MAKE SURE YOU:   Understand these instructions.  Will watch your condition.  Will get help right away if you are not doing well or get worse.   This information is not intended to replace advice  given to you by your health care provider. Make sure you discuss any questions you have with your health care provider.   Document Released: 07/19/2000 Document Revised: 08/12/2014 Document Reviewed: 03/04/2012 Elsevier Interactive Patient Education 2016 [place] are compounds that affect the level of uric acid in your body. A low-purine diet is a diet that is low in purines. Eating a low-purine diet can prevent the level of uric acid in your body from getting too high and causing gout or kidney stones or both. WHAT DO I NEED TO KNOW ABOUT THIS DIET?  Choose low-purine foods. Examples of low-purine foods are listed in the next section.  Drink plenty of fluids, especially water. Fluids can help remove uric acid from your body. Try to drink 8-16 cups (1.9-3.8 L) a day.  Limit foods high in fat, especially saturated fat, as fat makes it harder for the body to get rid of uric acid. Foods high in saturated fat include pizza, cheese, ice cream, whole milk, fried foods, and gravies. Choose foods that are lower in fat and lean sources of protein. Use olive oil when cooking as it contains healthy fats that are not high in saturated fat.  Limit alcohol. Alcohol interferes with the elimination of uric acid  from your body. If you are having a gout attack, avoid all alcohol.  Keep in mind that different people's bodies react differently to different foods. You will probably learn over time which foods do or do not affect you. If you discover that a food tends to cause your gout to flare up, avoid eating that food. You can more freely enjoy foods that do not cause problems. If you have any questions about a food item, talk to your dietitian or health care provider. WHICH FOODS ARE LOW, MODERATE, AND HIGH IN PURINES? The following is a list of foods that are low, moderate, and high in purines. You can eat any amount of the foods that are low in purines. You may be able to have  small amounts of foods that are moderate in purines. Ask your health care provider how much of a food moderate in purines you can have. Avoid foods high in purines. Grains  Foods low in purines: Enriched white bread, pasta, rice, cake, cornbread, popcorn.  Foods moderate in purines: Whole-grain breads and cereals, wheat germ, bran, oatmeal. Uncooked oatmeal. Dry wheat bran or wheat germ.  Foods high in purines: Pancakes, [place] toast, biscuits, muffins. Vegetables  Foods low in purines: All vegetables, except those that are moderate in purines.  Foods moderate in purines: Asparagus, cauliflower, spinach, mushrooms, green peas. Fruits  All fruits are low in purines. Meats and other Protein Foods  Foods low in purines: Eggs, nuts, peanut butter.  Foods moderate in purines: 80-90% lean beef, lamb, veal, pork, poultry, fish, eggs, peanut butter, nuts. Crab, lobster, oysters, and shrimp. Cooked dried beans, peas, and lentils.  Foods high in purines: Anchovies, sardines, herring, mussels, tuna, codfish, scallops, trout, and haddock. [place] Salines. Organ meats (such as liver or kidney). Tripe. Game meat. Goose. Sweetbreads. Dairy  All dairy foods are low in purines. Low-fat and fat-free dairy products are best because they are low in saturated fat. Beverages  Drinks low in purines: Water, carbonated beverages, tea, coffee, cocoa.  Drinks moderate in purines: Soft drinks and other drinks sweetened with high-fructose corn syrup. Juices. To find whether a food or drink is sweetened with high-fructose corn syrup, look at the ingredients list.  Drinks high in purines: Alcoholic beverages (such as beer). Condiments  Foods low in purines: Salt, herbs, olives, pickles, relishes, vinegar.  Foods moderate in purines: Butter, margarine, oils, mayonnaise. Fats and Oils  Foods low in purines: All types, except gravies and sauces made with meat.  Foods high in purines: Gravies and sauces made with  meat. Other Foods  Foods low in purines: Sugars, sweets, gelatin. Cake. Soups made without meat.  Foods moderate in purines: Meat-based or fish-based soups, broths, or bouillons. Foods and drinks sweetened with high-fructose corn syrup.  Foods high in purines: High-fat desserts (such as ice cream, cookies, cakes, pies, doughnuts, and chocolate). Contact your dietitian for more information on foods that are not listed here.   This information is not intended to replace advice given to you by your health care provider. Make sure you discuss any questions you have with your health care provider.   Document Released: 11/16/2010 Document Revised: 07/27/2013 Document Reviewed: 06/28/2013 Elsevier Interactive Patient Education [employer].

## 2015-05-30 ENCOUNTER — Ambulatory Visit: Payer: Self-pay | Admitting: Physician Assistant

## 2015-06-07 ENCOUNTER — Ambulatory Visit: Payer: Self-pay | Admitting: Physician Assistant

## 2015-06-07 ENCOUNTER — Encounter: Payer: Self-pay | Admitting: Physician Assistant

## 2015-06-07 VITALS — BP 166/90 | HR 79 | Temp 97.5°F | Ht 65.0 in | Wt 208.5 lb

## 2015-06-07 DIAGNOSIS — Z9119 Patient's noncompliance with other medical treatment and regimen: Secondary | ICD-10-CM

## 2015-06-07 DIAGNOSIS — Z91199 Patient's noncompliance with other medical treatment and regimen due to unspecified reason: Secondary | ICD-10-CM

## 2015-06-07 DIAGNOSIS — F17210 Nicotine dependence, cigarettes, uncomplicated: Secondary | ICD-10-CM

## 2015-06-07 DIAGNOSIS — M109 Gout, unspecified: Secondary | ICD-10-CM

## 2015-06-07 DIAGNOSIS — I10 Essential (primary) hypertension: Secondary | ICD-10-CM

## 2015-06-07 DIAGNOSIS — K219 Gastro-esophageal reflux disease without esophagitis: Secondary | ICD-10-CM

## 2015-06-07 DIAGNOSIS — E118 Type 2 diabetes mellitus with unspecified complications: Principal | ICD-10-CM

## 2015-06-07 DIAGNOSIS — E669 Obesity, unspecified: Secondary | ICD-10-CM

## 2015-06-07 DIAGNOSIS — E1165 Type 2 diabetes mellitus with hyperglycemia: Secondary | ICD-10-CM

## 2015-06-07 MED ORDER — LOSARTAN POTASSIUM-HCTZ 100-12.5 MG PO TABS: 1.0000 | ORAL_TABLET | Freq: Every day | ORAL | Status: DC

## 2015-06-07 MED ORDER — SITAGLIPTIN PHOS-METFORMIN HCL 50-1000 MG PO TABS: 1.0000 | ORAL_TABLET | Freq: Two times a day (BID) | ORAL | Status: DC

## 2015-06-07 NOTE — Progress Notes (Signed)
BP 166/90 mmHg  Pulse 79  Temp(Src) 97.5 F (36.4 C)  Ht 5\' 5"  (1.651 m)  Wt 208 lb 8 oz (94.575 kg)  BMI 34.70 kg/m2  SpO2 98%  LMP 08/21/2011   Subjective:    Patient ID: Robin [name], female    DOB: [birth year], 49 y.o.   MRN: [id number]  HPI: Robin [name] is a 49 y.o. female presenting on 06/07/2015 for New Patient (Initial Visit); Gout; and Abdominal Pain   HPI  Chief Complaint  Patient presents with  . New Patient (Initial Visit)  . Gout  . Abdominal Pain    due to ovarian cyst   Pt is not actually new- she is just returning after noncompliance and was suspended for services (aug 1-sep 1)  Pt ran out of many of her meds.  Last labs were in April with a1c of 14.0 Pt states etoh before gout outbreak  Pt also c/o legs itching. She scratched incessantly during interview and PE.  Relevant past medical, surgical, family and social history reviewed and updated as indicated. Interim medical history since our last visit reviewed. Allergies and medications reviewed and updated.   Current outpatient prescriptions:  .  albuterol (PROVENTIL HFA;VENTOLIN HFA) 108 (90 BASE) MCG/ACT inhaler, Inhale 1-2 puffs into the lungs every 6 (six) hours as needed for wheezing., Disp: 1 Inhaler, Rfl: 0 .  Insulin Glargine (LANTUS SOLOSTAR) 100 UNIT/ML Solostar Pen, Inject 15 Units into the skin at bedtime., Disp: , Rfl:  .  JANUMET 50-1000 MG per tablet, Take 1 tablet by mouth 2 (two) times daily., Disp: , Rfl: 1 .  omeprazole (PRILOSEC) 20 MG capsule, Take 1 capsule (20 mg total) by mouth daily. (Patient taking differently: Take 40 mg by mouth daily. ), Disp: 30 capsule, Rfl: 0   Review of Systems  Constitutional: Positive for appetite change. Negative for fever, chills, diaphoresis, fatigue and unexpected weight change.  HENT: Negative for congestion, dental problem, drooling, ear pain, facial swelling, hearing loss, mouth sores, sneezing, sore throat, trouble swallowing and voice  change.   Eyes: Negative for pain, discharge, redness, itching and visual disturbance.  Respiratory: Negative for cough, choking, shortness of breath and wheezing.   Cardiovascular: Positive for leg swelling. Negative for chest pain and palpitations.  Gastrointestinal: Positive for abdominal pain. Negative for vomiting, diarrhea, constipation and blood in stool.  Endocrine: Negative for cold intolerance, heat intolerance and polydipsia.  Genitourinary: Negative for dysuria, hematuria and decreased urine volume.  Musculoskeletal: Negative for back pain, arthralgias and gait problem.  Skin: Negative for rash.  Allergic/Immunologic: Negative for environmental allergies.  Neurological: Negative for seizures, syncope, light-headedness and headaches.  Hematological: Negative for adenopathy.  Psychiatric/Behavioral: Negative for suicidal ideas, dysphoric mood and agitation. The patient is not nervous/anxious.     Per HPI unless specifically indicated above     Objective:    BP 166/90 mmHg  Pulse 79  Temp(Src) 97.5 F (36.4 C)  Ht 5\' 5"  (1.651 m)  Wt 208 lb 8 oz (94.575 kg)  BMI 34.70 kg/m2  SpO2 98%  LMP 08/21/2011  Wt Readings from Last 3 Encounters:  06/07/15 208 lb 8 oz (94.575 kg)  05/28/15 210 lb (95.255 kg)  04/09/15 210 lb (95.255 kg)    Physical Exam  Constitutional: She is oriented to [place], place, [name]  HENT:  Head: Normocephalic and atraumatic.  Neck: Neck supple.  Cardiovascular: Normal rate and regular rhythm.   Pulmonary/Chest: Effort normal and  breath sounds normal.  Abdominal: Soft. Bowel sounds are normal. She exhibits no mass. There is no tenderness.  obese  Musculoskeletal: She exhibits no edema.  Lymphadenopathy:    She has no cervical adenopathy.  Neurological: She is alert and oriented to [place], place, and time.  Skin: Skin is warm and dry. No rash noted.  Legs dry with excoriations. No rash. Leg skin  with noted dryness.   Psychiatric: She has a normal mood and affect. Her behavior is normal.  Vitals reviewed.       Assessment & Plan:    Follow up plan: Encounter Diagnoses  Name Primary?  Marland Kitchen Uncontrolled type 2 diabetes mellitus with complication, unspecified long term insulin use status ([place]) Yes  . Essential hypertension, benign   . Gastroesophageal reflux disease, esophagitis presence not specified   . Personal history of noncompliance with medical treatment, presenting hazards to health   . Cigarette nicotine dependence, uncomplicated   . Obesity, unspecified   . Gout without tophus, unspecified cause, unspecified chronicity, unspecified site      -Pt to get labs drawn- only need a1c as last lipids, cbc and cmp were good. -pt still needs to attend DM educ class -pt counseled to Avoid etoh due to DM and gout - gave low purine diet handout -Send rx to [facility]- janumet and losartan/hctz -will Order insulin for pt- gave sample today -gave sample align probiotic to pt today -counseled pt on moisturizing skin. Can take benedryl prn itching. Urged her to stop scratching -F/u 1 mo

## 2015-06-07 NOTE — Patient Instructions (Signed)
Low-Purine Diet  Purines are compounds that affect the level of uric acid in your body. A low-purine diet is a diet that is low in purines. Eating a low-purine diet can prevent the level of uric acid in your body from getting too high and causing gout or kidney stones or both.  WHAT DO I NEED TO KNOW ABOUT THIS DIET?  · Choose low-purine foods. Examples of low-purine foods are listed in the next section.  · Drink plenty of fluids, especially water. Fluids can help remove uric acid from your body. Try to drink 8-16 cups (1.9-3.8 L) a day.  · Limit foods high in fat, especially saturated fat, as fat makes it harder for the body to get rid of uric acid. Foods high in saturated fat include pizza, cheese, ice cream, whole milk, fried foods, and gravies. Choose foods that are lower in fat and lean sources of protein. Use olive oil when cooking as it contains healthy fats that are not high in saturated fat.  · Limit alcohol. Alcohol interferes with the elimination of uric acid from your body. If you are having a gout attack, avoid all alcohol.  · Keep in mind that different people's bodies react differently to different foods. You will probably learn over time which foods do or do not affect you. If you discover that a food tends to cause your gout to flare up, avoid eating that food. You can more freely enjoy foods that do not cause problems. If you have any questions about a food item, talk to your dietitian or health care provider.  WHICH FOODS ARE LOW, MODERATE, AND HIGH IN PURINES?  The following is a list of foods that are low, moderate, and high in purines. You can eat any amount of the foods that are low in purines. You may be able to have small amounts of foods that are moderate in purines. Ask your health care provider how much of a food moderate in purines you can have. Avoid foods high in purines.  Grains  · Foods low in purines: Enriched white bread, pasta, rice, cake, cornbread, popcorn.  · Foods moderate in  purines: Whole-grain breads and cereals, wheat germ, bran, oatmeal. Uncooked oatmeal. Dry wheat bran or wheat germ.  · Foods high in purines: Pancakes, French toast, biscuits, muffins.  Vegetables  · Foods low in purines: All vegetables, except those that are moderate in purines.  · Foods moderate in purines: Asparagus, cauliflower, spinach, mushrooms, green peas.  Fruits  · All fruits are low in purines.  Meats and other Protein Foods  · Foods low in purines: Eggs, nuts, peanut butter.  · Foods moderate in purines: 80-90% lean beef, lamb, veal, pork, poultry, fish, eggs, peanut butter, nuts. Crab, lobster, oysters, and shrimp. Cooked dried beans, peas, and lentils.  · Foods high in purines: Anchovies, sardines, herring, mussels, tuna, codfish, scallops, trout, and haddock. Bacon. Organ meats (such as liver or kidney). Tripe. Game meat. Goose. Sweetbreads.  Dairy  · All dairy foods are low in purines. Low-fat and fat-free dairy products are best because they are low in saturated fat.  Beverages  · Drinks low in purines: Water, carbonated beverages, tea, coffee, cocoa.  · Drinks moderate in purines: Soft drinks and other drinks sweetened with high-fructose corn syrup. Juices. To find whether a food or drink is sweetened with high-fructose corn syrup, look at the ingredients list.  · Drinks high in purines: Alcoholic beverages (such as beer).  Condiments  · Foods   low in purines: Salt, herbs, olives, pickles, relishes, vinegar.  · Foods moderate in purines: Butter, margarine, oils, mayonnaise.  Fats and Oils  · Foods low in purines: All types, except gravies and sauces made with meat.  · Foods high in purines: Gravies and sauces made with meat.  Other Foods  · Foods low in purines: Sugars, sweets, gelatin. Cake. Soups made without meat.  · Foods moderate in purines: Meat-based or fish-based soups, broths, or bouillons. Foods and drinks sweetened with high-fructose corn syrup.  · Foods high in purines: High-fat desserts  (such as ice cream, cookies, cakes, pies, doughnuts, and chocolate).  Contact your dietitian for more information on foods that are not listed here.     This information is not intended to replace advice given to you by your health care provider. Make sure you discuss any questions you have with your health care provider.     Document Released: 11/16/2010 Document Revised: 07/27/2013 Document Reviewed: 06/28/2013  Elsevier Interactive Patient Education ©2016 [employer]

## 2015-06-28 ENCOUNTER — Emergency Department (HOSPITAL_COMMUNITY)
Admission: EM | Admit: 2015-06-28 | Discharge: 2015-06-28 | Disposition: A | Discharge status: Home / Self Care | Payer: Self-pay | Attending: Emergency Medicine | Admitting: Emergency Medicine

## 2015-06-28 ENCOUNTER — Encounter (HOSPITAL_COMMUNITY): Payer: Self-pay | Admitting: Emergency Medicine

## 2015-06-28 DIAGNOSIS — Z79899 Other long term (current) drug therapy: Secondary | ICD-10-CM | POA: Insufficient documentation

## 2015-06-28 DIAGNOSIS — Z794 Long term (current) use of insulin: Secondary | ICD-10-CM | POA: Insufficient documentation

## 2015-06-28 DIAGNOSIS — Z87891 Personal history of nicotine dependence: Secondary | ICD-10-CM | POA: Insufficient documentation

## 2015-06-28 DIAGNOSIS — G8929 Other chronic pain: Secondary | ICD-10-CM | POA: Insufficient documentation

## 2015-06-28 DIAGNOSIS — J441 Chronic obstructive pulmonary disease with (acute) exacerbation: Secondary | ICD-10-CM | POA: Insufficient documentation

## 2015-06-28 DIAGNOSIS — I509 Heart failure, unspecified: Secondary | ICD-10-CM | POA: Insufficient documentation

## 2015-06-28 DIAGNOSIS — J069 Acute upper respiratory infection, unspecified: Secondary | ICD-10-CM | POA: Insufficient documentation

## 2015-06-28 DIAGNOSIS — E119 Type 2 diabetes mellitus without complications: Secondary | ICD-10-CM | POA: Insufficient documentation

## 2015-06-28 DIAGNOSIS — Z880 Allergy status to penicillin: Secondary | ICD-10-CM | POA: Insufficient documentation

## 2015-06-28 DIAGNOSIS — I10 Essential (primary) hypertension: Secondary | ICD-10-CM | POA: Insufficient documentation

## 2015-06-28 DIAGNOSIS — K219 Gastro-esophageal reflux disease without esophagitis: Secondary | ICD-10-CM | POA: Insufficient documentation

## 2015-06-28 DIAGNOSIS — Z8742 Personal history of other diseases of the female genital tract: Secondary | ICD-10-CM | POA: Insufficient documentation

## 2015-06-28 DIAGNOSIS — Z8739 Personal history of other diseases of the musculoskeletal system and connective tissue: Secondary | ICD-10-CM | POA: Insufficient documentation

## 2015-06-28 DIAGNOSIS — H65191 Other acute nonsuppurative otitis media, right ear: Secondary | ICD-10-CM | POA: Insufficient documentation

## 2015-06-28 MED ORDER — GUAIFENESIN-CODEINE 100-10 MG/5ML PO SYRP: 10.0000 mL | ORAL_SOLUTION | Freq: Three times a day (TID) | ORAL | Status: DC | PRN

## 2015-06-28 MED ORDER — AZITHROMYCIN 250 MG PO TABS: 250.0000 mg | ORAL_TABLET | Freq: Every day | ORAL | Status: DC

## 2015-06-28 MED ORDER — ALBUTEROL SULFATE HFA 108 (90 BASE) MCG/ACT IN AERS
2.0000 | INHALATION_SPRAY | Freq: Once | RESPIRATORY_TRACT | Status: AC
  Administered 2015-06-28: 2 via RESPIRATORY_TRACT
  Filled 2015-06-28: qty 6.7

## 2015-06-28 NOTE — Discharge Instructions (Signed)
Otitis Media, Adult °Otitis media is redness, soreness, and puffiness (swelling) in the space just behind your eardrum (middle ear). It may be caused by allergies or infection. It often happens along with a cold. °HOME CARE °· Take your medicine as told. Finish it even if you start to feel better. °· Only take over-the-counter or prescription medicines for pain, discomfort, or fever as told by your doctor. °· Follow up with your doctor as told. °GET HELP IF: °· You have otitis media only in one ear, or bleeding from your nose, or both. °· You notice a lump on your neck. °· You are not getting better in 3-5 days. °· You feel worse instead of better. °GET HELP RIGHT AWAY IF:  °· You have pain that is not helped with medicine. °· You have puffiness, redness, or pain around your ear. °· You get a stiff neck. °· You cannot move part of your face (paralysis). °· You notice that the bone behind your ear hurts when you touch it. °MAKE SURE YOU:  °· Understand these instructions. °· Will watch your condition. °· Will get help right away if you are not doing well or get worse. °  °This information is not intended to replace advice given to you by your health care provider. Make sure you discuss any questions you have with your health care provider. °  °Document Released: 01/08/2008 Document Revised: 08/12/2014 Document Reviewed: 02/16/2013 °Elsevier Interactive Patient Education ©2016 Else[employer] Inc. ° ° °

## 2015-06-28 NOTE — ED Notes (Signed)
Pt reports cough,right ear pain,sore throat since yesterday. Pt reports intermittent productive cough. nad noted.

## 2015-07-01 NOTE — ED Provider Notes (Signed)
CSN: [id number]     Arrival date & time 06/28/15  1317 History   First MD Initiated Contact with Patient 06/28/15 1330     Chief Complaint  Patient presents with  . Cough     (Consider location/radiation/quality/duration/timing/severity/associated sxs/prior Treatment) HPI   Robin [name] is a 49 y.o. female who presents to the Emergency Department complaining of cough, congestion, ear pain and sore throat.  She states the symptoms began one day prior to arrival.  She reports the pain in her ear has worsened since onset.  Cough has been intermittent and mostly non-productive.  She has not tried any medications for her symptoms.  She denies fever, vomiting, rash, chest pain or shortness of breath.      Past Medical History  Diagnosis Date  . Type 2 diabetes mellitus ([place])   . Essential hypertension, benign   . GERD (gastroesophageal reflux disease)   . COPD (chronic obstructive pulmonary disease) ([place])   . Arthritis   . Normal coronary arteries     3/10 - following abnormal Myoview  . Diabetes mellitus   . CHF (congestive heart failure) ([place])   . Chronic abdominal pain   . Ovarian cyst   . Gout   . Gout 2016   Past Surgical History  Procedure Laterality Date  . Scar revision  09/10/2011    Procedure: SCAR REVISION;  Surgeon: Jonnie Kind, MD;  Location: [facility];  Service: Gynecology;  Laterality: N/A;  Wide Excision of old Cicatrix  . Cesarean section  [id number], and 1994  . Abdominal hysterectomy  09/10/2011    Procedure: HYSTERECTOMY ABDOMINAL;  Surgeon: Jonnie Kind, MD;  Location: [facility];  Service: Gynecology;  Laterality: N/A;  Abdominal hysterectomy  . Cholecystectomy  1995  . Tubal ligation  1994   Family History  Problem Relation Age of Onset  . Cirrhosis Mother   . Diabetes type II Father   . Diabetes type II Sister   . Anesthesia problems Neg Hx   . Hypotension Neg Hx   . Malignant hyperthermia Neg Hx   . Pseudochol deficiency Neg Hx    Social History   Substance Use Topics  . Smoking status: Former Smoker -- 0.25 packs/day for 29 years    Types: Cigarettes  . Smokeless tobacco: Never Used     Comment: 1 cigarette a day  . Alcohol Use: 0.0 oz/week     Comment: "once in a while"   OB History    Gravida Para Term Preterm AB TAB SAB Ectopic Multiple Living            3     Review of Systems  Constitutional: Negative for fever, chills, activity change and appetite change.  HENT: Positive for congestion, ear pain, rhinorrhea and sore throat. Negative for facial swelling and trouble swallowing.   Eyes: Negative for visual disturbance.  Respiratory: Positive for cough. Negative for shortness of breath, wheezing and stridor.   Gastrointestinal: Negative for nausea and vomiting.  Genitourinary: Negative for dysuria.  Musculoskeletal: Negative for neck pain and neck stiffness.  Skin: Negative.  Negative for rash.  Neurological: Negative for dizziness, weakness, numbness and headaches.  Hematological: Negative for adenopathy.  Psychiatric/Behavioral: Negative for confusion.  All other systems reviewed and are negative.     Allergies  Bee venom; Naproxen; and Penicillins  Home Medications   Prior to Admission medications   Medication Sig Start Date End Date Taking? Authorizing Provider  albuterol (PROVENTIL HFA;VENTOLIN HFA) 108 (90 BASE)  MCG/ACT inhaler Inhale 1-2 puffs into the lungs every 6 (six) hours as needed for wheezing. 08/25/12   Nat Christen, MD  azithromycin (ZITHROMAX) 250 MG tablet Take 1 tablet (250 mg total) by mouth daily. Take first 2 tablets together, then 1 every day until finished. 06/28/15   Clayten Allcock, PA-C  guaiFENesin-codeine (ROBITUSSIN AC) 100-10 MG/5ML syrup Take 10 mLs by mouth 3 (three) times daily as needed. 06/28/15   Shantae Vantol, PA-C  Insulin Glargine (LANTUS SOLOSTAR) 100 UNIT/ML Solostar Pen Inject 15 Units into the skin at bedtime.    Historical Provider, MD  losartan-hydrochlorothiazide  (HYZAAR) 100-12.5 MG tablet Take 1 tablet by mouth daily. 06/07/15   Soyla Dryer, PA-C  omeprazole (PRILOSEC) 20 MG capsule Take 1 capsule (20 mg total) by mouth daily. Patient taking differently: Take 40 mg by mouth daily.  08/16/14   Francine Graven, DO  sitaGLIPtin-metformin (JANUMET) 50-1000 MG tablet Take 1 tablet by mouth 2 (two) times daily with a meal. 06/07/15   Soyla Dryer, PA-C   BP 168/88 mmHg  Pulse 94  Temp(Src) 98.3 F (36.8 C) (Oral)  Resp 20  Ht 5\' 6"  (1.676 m)  Wt 94.348 kg  BMI 33.59 kg/m2  SpO2 98%  LMP 08/21/2011 Physical Exam  Constitutional: She is oriented to [place], place, [name] No distress.  HENT:  Head: Normocephalic and atraumatic.  Right Ear: Ear canal normal. No mastoid tenderness. Tympanic membrane is erythematous. No middle ear effusion. No hemotympanum.  Left Ear: Tympanic membrane and ear canal normal.  Nose: Mucosal edema and rhinorrhea present.  Mouth/Throat: Uvula is midline and mucous membranes are normal. No trismus in the jaw. No uvula swelling. Posterior oropharyngeal erythema present. No oropharyngeal exudate, posterior oropharyngeal edema or tonsillar abscesses.  Eyes: Conjunctivae are normal.  Neck: Normal range of motion and phonation normal. Neck supple. No Brudzinski's sign and no Kernig's sign noted.  Cardiovascular: Normal rate, regular rhythm, normal heart sounds and intact distal pulses.   No murmur heard. Pulmonary/Chest: Effort normal. No respiratory distress. She has wheezes. She has no rales. She exhibits no tenderness.  Few scattered expiratory wheezes.  No rales.    Abdominal: Soft. She exhibits no distension. There is no tenderness. There is no rebound and no guarding.  Musculoskeletal: She exhibits no edema.  Lymphadenopathy:    She has no cervical adenopathy.  Neurological: She is alert and oriented to [place], place, and time. She exhibits normal muscle tone.  Coordination normal.  Skin: Skin is warm and dry.  Nursing note and vitals reviewed.   ED Course  Procedures (including critical care time)    MDM   Final diagnoses:  Acute nonsuppurative otitis media of right ear  URI (upper respiratory infection)    Pt is well appearing , non-toxic.  Vitals are stable.  Acute OM and likely URI.  She appears stable for d/c and agrees to PMD f/u .  Inhaler dispensed.      Kem Parkinson, PA-C 07/01/15 [place], MD 07/01/15 2308

## 2015-07-06 ENCOUNTER — Ambulatory Visit: Payer: Self-pay | Admitting: Physician Assistant

## 2015-07-06 ENCOUNTER — Encounter: Payer: Self-pay | Admitting: Physician Assistant

## 2015-07-06 VITALS — BP 176/94 | HR 80 | Temp 98.1°F | Ht 65.0 in | Wt 208.6 lb

## 2015-07-06 DIAGNOSIS — E1165 Type 2 diabetes mellitus with hyperglycemia: Secondary | ICD-10-CM

## 2015-07-06 DIAGNOSIS — M795 Residual foreign body in soft tissue: Secondary | ICD-10-CM

## 2015-07-06 DIAGNOSIS — Z91199 Patient's noncompliance with other medical treatment and regimen due to unspecified reason: Secondary | ICD-10-CM

## 2015-07-06 DIAGNOSIS — E118 Type 2 diabetes mellitus with unspecified complications: Principal | ICD-10-CM

## 2015-07-06 DIAGNOSIS — Z9119 Patient's noncompliance with other medical treatment and regimen: Secondary | ICD-10-CM

## 2015-07-06 MED ORDER — METOPROLOL TARTRATE 50 MG PO TABS: 50.0000 mg | ORAL_TABLET | Freq: Two times a day (BID) | ORAL | Status: DC

## 2015-07-06 NOTE — Progress Notes (Signed)
BP 176/94 mmHg  Pulse 80  Temp(Src) 98.1 F (36.7 C)  Ht 5\' 5"  (1.651 m)  Wt 208 lb 9.6 oz (94.62 kg)  BMI 34.71 kg/m2  SpO2 99%  LMP 08/21/2011   Subjective:    Patient ID: Robin [name], female    DOB: 1966/07/06, 49 y.o.   MRN: [id number]  HPI: Robin [name] is a 49 y.o. female presenting on 07/06/2015 for Diabetes   HPI  Chief Complaint  Patient presents with  . Diabetes    pt did not get labs drawn    Pt did not get her zpack yet b/c she didn't have the money for it.  I gave pt a coupon.  Pt is here for her DM but she didn't get her labs drawn and she wants to have her foot looked at. Said she got glass in it last week and it still hurts.  Discussed FB removal procedure including risks and benefits and pt signed informed consent form.  Relevant past medical, surgical, family and social history reviewed and updated as indicated. Interim medical history since our last visit reviewed. Allergies and medications reviewed and updated.  Current outpatient prescriptions:  .  albuterol (PROVENTIL HFA;VENTOLIN HFA) 108 (90 BASE) MCG/ACT inhaler, Inhale 1-2 puffs into the lungs every 6 (six) hours as needed for wheezing., Disp: 1 Inhaler, Rfl: 0 .  Insulin Glargine (LANTUS SOLOSTAR) 100 UNIT/ML Solostar Pen, Inject 16 Units into the skin at bedtime. , Disp: , Rfl:  .  losartan-hydrochlorothiazide (HYZAAR) 100-12.5 MG tablet, Take 1 tablet by mouth daily., Disp: 90 tablet, Rfl: 0 .  omeprazole (PRILOSEC) 20 MG capsule, Take 1 capsule (20 mg total) by mouth daily. (Patient taking differently: Take 40 mg by mouth daily. ), Disp: 30 capsule, Rfl: 0 .  Probiotic Product (ALIGN PO), Take by mouth daily., Disp: , Rfl:  .  sitaGLIPtin-metformin (JANUMET) 50-1000 MG tablet, Take 1 tablet by mouth 2 (two) times daily with a meal., Disp: 180 tablet, Rfl: 0 .  azithromycin (ZITHROMAX) 250 MG tablet, Take 1 tablet (250 mg total) by mouth daily. Take first 2 tablets together, then 1 every day  until finished. (Patient not taking: Reported on 07/06/2015), Disp: 6 tablet, Rfl: 0 .  guaiFENesin-codeine (ROBITUSSIN AC) 100-10 MG/5ML syrup, Take 10 mLs by mouth 3 (three) times daily as needed. (Patient not taking: Reported on 07/06/2015), Disp: 100 mL, Rfl: 0   Review of Systems  Constitutional: Positive for appetite change. Negative for fever, chills, diaphoresis, fatigue and unexpected weight change.  HENT: Positive for congestion, ear pain and sore throat. Negative for dental problem, drooling, facial swelling, hearing loss, mouth sores, sneezing, trouble swallowing and voice change.   Eyes: Negative for pain, discharge, redness, itching and visual disturbance.  Respiratory: Positive for cough and wheezing. Negative for choking and shortness of breath.   Cardiovascular: Positive for leg swelling. Negative for chest pain and palpitations.  Gastrointestinal: Negative for vomiting, abdominal pain, diarrhea, constipation and blood in stool.  Endocrine: Negative for cold intolerance, heat intolerance and polydipsia.  Genitourinary: Negative for dysuria, hematuria and decreased urine volume.  Musculoskeletal: Negative for back pain, arthralgias and gait problem.  Skin: Negative for rash.  Allergic/Immunologic: Negative for environmental allergies.  Neurological: Negative for seizures, syncope, light-headedness and headaches.  Hematological: Negative for adenopathy.  Psychiatric/Behavioral: Negative for suicidal ideas, dysphoric mood and agitation. The patient is not nervous/anxious.     Per HPI unless specifically indicated above     Objective:  BP 176/94 mmHg  Pulse 80  Temp(Src) 98.1 F (36.7 C)  Ht 5\' 5"  (1.651 m)  Wt 208 lb 9.6 oz (94.62 kg)  BMI 34.71 kg/m2  SpO2 99%  LMP 08/21/2011  Wt Readings from Last 3 Encounters:  07/06/15 208 lb 9.6 oz (94.62 kg)  06/28/15 208 lb (94.348 kg)  06/07/15 208 lb 8 oz (94.575 kg)    Physical Exam  Constitutional: She appears  well-developed and well-nourished.  HENT:  Head: Normocephalic and atraumatic.  Neck: Neck supple.  Cardiovascular: Normal rate and regular rhythm.   Pulmonary/Chest: Effort normal and breath sounds normal. No respiratory distress. She has no wheezes. She has no rales.  Lymphadenopathy:    She has no cervical adenopathy.  Skin:  R foot without redness. Mild tenderness over 4th/5th mtp area with indication of retained body.  Plantar surface right foot is cleaned with hibiclens. Instilled 1cc of 1% lidocaine. Opened the area with #11 blade and explored with tweezers. Removed small sliver of glass.  No bleeding.   Area covered with band-aid. Pt tolerated procedure well and ambulated from room without difficulty.  Vitals reviewed.       Assessment & Plan:   Encounter Diagnoses  Name Primary?  Marland Kitchen Uncontrolled type 2 diabetes mellitus with complication, unspecified long term insulin use status ([place]) Yes  . Foreign body (FB) in soft tissue   . Personal history of noncompliance with medical treatment, presenting hazards to health     -Discussed with pt her noncompliance which she has had a problem with in the past and that is going to be interfering with the ability to get her diabetes controlled.  Pt says that she will do better. -Pt is to get labs drawn today when leaves office (to check a1c) -pt is given coupon to get her antibiotic (that was prescribed for her by the ED) -counseled pt to keep food wound covered today. She can take a tylenol if needed for discomfort -f/u 1 mo

## 2015-08-08 ENCOUNTER — Ambulatory Visit: Payer: Self-pay | Admitting: Physician Assistant

## 2015-08-21 ENCOUNTER — Other Ambulatory Visit: Payer: Self-pay | Admitting: Physician Assistant

## 2015-08-21 ENCOUNTER — Ambulatory Visit: Payer: Self-pay | Admitting: Physician Assistant

## 2015-08-21 ENCOUNTER — Encounter: Payer: Self-pay | Admitting: Physician Assistant

## 2015-08-21 VITALS — BP 174/90 | HR 69 | Temp 97.7°F | Ht 65.0 in | Wt 208.2 lb

## 2015-08-21 DIAGNOSIS — Z91199 Patient's noncompliance with other medical treatment and regimen due to unspecified reason: Secondary | ICD-10-CM

## 2015-08-21 DIAGNOSIS — Z9119 Patient's noncompliance with other medical treatment and regimen: Secondary | ICD-10-CM

## 2015-08-21 DIAGNOSIS — I10 Essential (primary) hypertension: Secondary | ICD-10-CM

## 2015-08-21 DIAGNOSIS — R059 Cough, unspecified: Secondary | ICD-10-CM

## 2015-08-21 DIAGNOSIS — E1165 Type 2 diabetes mellitus with hyperglycemia: Secondary | ICD-10-CM

## 2015-08-21 DIAGNOSIS — E118 Type 2 diabetes mellitus with unspecified complications: Secondary | ICD-10-CM

## 2015-08-21 DIAGNOSIS — R05 Cough: Secondary | ICD-10-CM

## 2015-08-21 LAB — HEMOGLOBIN A1C
Hgb A1c MFr Bld: 7.9 % — ABNORMAL HIGH (ref <5.7)
Mean Plasma Glucose: 180 mg/dL — ABNORMAL HIGH (ref <117)

## 2015-08-21 MED ORDER — BENZONATATE 100 MG PO CAPS: ORAL_CAPSULE | ORAL | Status: DC

## 2015-08-21 MED ORDER — METOPROLOL TARTRATE 100 MG PO TABS: 100.0000 mg | ORAL_TABLET | Freq: Two times a day (BID) | ORAL | Status: DC

## 2015-08-21 NOTE — Progress Notes (Signed)
BP 174/90 mmHg  Pulse 69  Temp(Src) 97.7 F (36.5 C)  Ht 5\' 5"  (1.651 m)  Wt 208 lb 3.2 oz (94.439 kg)  BMI 34.65 kg/m2  SpO2 98%  LMP 08/21/2011   Subjective:    Patient ID: Robin [name], female    DOB: [birth year], 50 y.o.   MRN: [id number]  HPI: [name] is a 50 y.o. female presenting on 08/21/2015 for Diabetes and Hypertension   HPI Pt is actually no longer pt of [facility] after she no-showed 08/08/15 and had been having ongoing noncompliance but appointment for her was inadevertantly made so I was asked to she her.  Discusssed with pt that this will be her last appt here and she states understanding.  She was given phone numbers for [facility] and [facility] to contact for possible new PCP.  Pt did not get labs drawn after 07/06/15 appt as instructed.  Pt states she took the zpack that was rx for her in ER on 06/28/15 and I gave her a coupon for on 07/06/15.  She c/o headache frontal for 2 wk.  She also has cough.  No sob.  Some wheezing.   Relevant past medical, surgical, family and social history reviewed and updated as indicated. Interim medical history since our last visit reviewed. Allergies and medications reviewed and updated.  Current outpatient prescriptions:  .  albuterol (PROVENTIL HFA;VENTOLIN HFA) 108 (90 BASE) MCG/ACT inhaler, Inhale 1-2 puffs into the lungs every 6 (six) hours as needed for wheezing., Disp: 1 Inhaler, Rfl: 0 .  guaiFENesin-codeine (ROBITUSSIN AC) 100-10 MG/5ML syrup, Take 10 mLs by mouth 3 (three) times daily as needed., Disp: 100 mL, Rfl: 0 .  Insulin Glargine (LANTUS SOLOSTAR) 100 UNIT/ML Solostar Pen, Inject 15 Units into the skin at bedtime. , Disp: , Rfl:  .  losartan-hydrochlorothiazide (HYZAAR) 100-12.5 MG tablet, Take 1 tablet by mouth daily., Disp: 90 tablet, Rfl: 0 .  metoprolol (LOPRESSOR) 50 MG tablet, Take 1 tablet (50 mg total) by mouth 2 (two) times daily., Disp: 180 tablet, Rfl: 0 .  omeprazole (PRILOSEC) 20 MG capsule, Take 1 capsule (20  mg total) by mouth daily. (Patient taking differently: Take 40 mg by mouth daily. ), Disp: 30 capsule, Rfl: 0 .  Probiotic Product (ALIGN PO), Take by mouth daily., Disp: , Rfl:  .  sitaGLIPtin-metformin (JANUMET) 50-1000 MG tablet, Take 1 tablet by mouth 2 (two) times daily with a meal., Disp: 180 tablet, Rfl: 0   Review of Systems  Constitutional: Negative for fever, chills, diaphoresis, appetite change, fatigue and unexpected weight change.  HENT: Positive for congestion. Negative for dental problem, drooling, ear pain, facial swelling, hearing loss, mouth sores, sneezing, sore throat, trouble swallowing and voice change.   Eyes: Positive for itching. Negative for pain, discharge, redness and visual disturbance.  Respiratory: Positive for wheezing. Negative for cough, choking and shortness of breath.   Cardiovascular: Negative for chest pain, palpitations and leg swelling.  Gastrointestinal: Negative for vomiting, abdominal pain, diarrhea, constipation and blood in stool.  Endocrine: Negative for cold intolerance, heat intolerance and polydipsia.  Genitourinary: Negative for dysuria, hematuria and decreased urine volume.  Musculoskeletal: Negative for back pain, arthralgias and gait problem.  Skin: Negative for rash.  Allergic/Immunologic: Negative for environmental allergies.  Neurological: Negative for seizures, syncope, light-headedness and headaches.  Hematological: Negative for adenopathy.  Psychiatric/Behavioral: Negative for suicidal ideas, dysphoric mood and agitation. The patient is not nervous/anxious.     Per HPI unless specifically indicated above  Objective:    BP 174/90 mmHg  Pulse 69  Temp(Src) 97.7 F (36.5 C)  Ht 5\' 5"  (1.651 m)  Wt 208 lb 3.2 oz (94.439 kg)  BMI 34.65 kg/m2  SpO2 98%  LMP 08/21/2011  Wt Readings from Last 3 Encounters:  08/21/15 208 lb 3.2 oz (94.439 kg)  07/06/15 208 lb 9.6 oz (94.62 kg)  06/28/15 208 lb (94.348 kg)    Physical Exam   Constitutional: She is oriented to [place], place, [name]  HENT:  Head: Normocephalic and atraumatic.  Neck: Neck supple.  Cardiovascular: Normal rate and regular rhythm.   Pulmonary/Chest: Effort normal and breath sounds normal.  Abdominal: Soft. Bowel sounds are normal. She exhibits no distension and no mass. There is no tenderness.  Musculoskeletal: She exhibits no edema.  Lymphadenopathy:    She has no cervical adenopathy.  Neurological: She is alert and oriented to [place], place, and time. She has normal strength. No cranial nerve deficit or sensory deficit. Gait normal.  Skin: Skin is warm and dry.  Psychiatric: She has a normal mood and affect. Her behavior is normal.  Vitals reviewed.       Assessment & Plan:   Encounter Diagnoses  Name Primary?  . Essential hypertension, benign Yes  . Cough   . Personal history of noncompliance with medical treatment, presenting hazards to health   . Uncontrolled type 2 diabetes mellitus with complication, unspecified long term insulin use status ([facility])     -Increase metoprolol.  Pt to get started on this today. -Gave rx tessalon to use prn cough -Counseled pt to get appointment with new pcp ASAP to manage her ongoing medical problems.  Pt states that she will.

## 2015-08-28 ENCOUNTER — Encounter: Payer: Self-pay | Admitting: Physician Assistant

## 2015-09-14 ENCOUNTER — Emergency Department (HOSPITAL_COMMUNITY)
Admission: EM | Admit: 2015-09-14 | Discharge: 2015-09-14 | Disposition: A | Discharge status: Home / Self Care | Payer: Self-pay | Attending: Emergency Medicine | Admitting: Emergency Medicine

## 2015-09-14 ENCOUNTER — Encounter (HOSPITAL_COMMUNITY): Payer: Self-pay | Admitting: Emergency Medicine

## 2015-09-14 ENCOUNTER — Emergency Department (HOSPITAL_COMMUNITY): Payer: Self-pay

## 2015-09-14 DIAGNOSIS — Z880 Allergy status to penicillin: Secondary | ICD-10-CM | POA: Insufficient documentation

## 2015-09-14 DIAGNOSIS — G8929 Other chronic pain: Secondary | ICD-10-CM | POA: Insufficient documentation

## 2015-09-14 DIAGNOSIS — J069 Acute upper respiratory infection, unspecified: Secondary | ICD-10-CM | POA: Insufficient documentation

## 2015-09-14 DIAGNOSIS — I10 Essential (primary) hypertension: Secondary | ICD-10-CM | POA: Insufficient documentation

## 2015-09-14 DIAGNOSIS — Z87891 Personal history of nicotine dependence: Secondary | ICD-10-CM | POA: Insufficient documentation

## 2015-09-14 DIAGNOSIS — Z8739 Personal history of other diseases of the musculoskeletal system and connective tissue: Secondary | ICD-10-CM | POA: Insufficient documentation

## 2015-09-14 DIAGNOSIS — Z794 Long term (current) use of insulin: Secondary | ICD-10-CM | POA: Insufficient documentation

## 2015-09-14 DIAGNOSIS — I509 Heart failure, unspecified: Secondary | ICD-10-CM | POA: Insufficient documentation

## 2015-09-14 DIAGNOSIS — E119 Type 2 diabetes mellitus without complications: Secondary | ICD-10-CM | POA: Insufficient documentation

## 2015-09-14 DIAGNOSIS — K219 Gastro-esophageal reflux disease without esophagitis: Secondary | ICD-10-CM | POA: Insufficient documentation

## 2015-09-14 DIAGNOSIS — J441 Chronic obstructive pulmonary disease with (acute) exacerbation: Secondary | ICD-10-CM | POA: Insufficient documentation

## 2015-09-14 DIAGNOSIS — H9201 Otalgia, right ear: Secondary | ICD-10-CM | POA: Insufficient documentation

## 2015-09-14 DIAGNOSIS — Z7984 Long term (current) use of oral hypoglycemic drugs: Secondary | ICD-10-CM | POA: Insufficient documentation

## 2015-09-14 DIAGNOSIS — Z79899 Other long term (current) drug therapy: Secondary | ICD-10-CM | POA: Insufficient documentation

## 2015-09-14 LAB — BASIC METABOLIC PANEL
Anion gap: 7 (ref 5–15)
BUN: 12 mg/dL (ref 6–20)
CO2: 27 mmol/L (ref 22–32)
Calcium: 8.7 mg/dL — ABNORMAL LOW (ref 8.9–10.3)
Chloride: 105 mmol/L (ref 101–111)
Creatinine, Ser: 0.65 mg/dL (ref 0.44–1.00)
GFR calc Af Amer: >60 mL/min (ref >60)
GFR calc non Af Amer: >60 mL/min (ref >60)
Glucose, Bld: 152 mg/dL — ABNORMAL HIGH (ref 65–99)
Potassium: 4.1 mmol/L (ref 3.5–5.1)
Sodium: 139 mmol/L (ref 135–145)

## 2015-09-14 LAB — BRAIN NATRIURETIC PEPTIDE: B Natriuretic Peptide: 548 pg/mL — ABNORMAL HIGH (ref 0.0–100.0)

## 2015-09-14 LAB — CBC WITH DIFFERENTIAL/PLATELET
Basophils Absolute: 0 10*3/uL (ref 0.0–0.1)
Basophils Relative: 0 %
Eosinophils Absolute: 0.3 10*3/uL (ref 0.0–0.7)
Eosinophils Relative: 4 %
HCT: 41.6 % (ref 36.0–46.0)
Hemoglobin: 14 g/dL (ref 12.0–15.0)
Lymphocytes Relative: 36 %
Lymphs Abs: 2.5 10*3/uL (ref 0.7–4.0)
MCH: 29.9 pg (ref 26.0–34.0)
MCHC: 33.7 g/dL (ref 30.0–36.0)
MCV: 88.7 fL (ref 78.0–100.0)
Monocytes Absolute: 0.5 10*3/uL (ref 0.1–1.0)
Monocytes Relative: 7 %
Neutro Abs: 3.6 10*3/uL (ref 1.7–7.7)
Neutrophils Relative %: 53 %
Platelets: 190 10*3/uL (ref 150–400)
RBC: 4.69 MIL/uL (ref 3.87–5.11)
RDW: 13.4 % (ref 11.5–15.5)
WBC: 6.9 10*3/uL (ref 4.0–10.5)

## 2015-09-14 LAB — TROPONIN I: Troponin I: <0.03 ng/mL (ref <0.031)

## 2015-09-14 IMAGING — DX DG CHEST 2V
2 series · 2 of 2 positions shown · non-contrast
Comparison: [DATE]

CLINICAL DATA: Cough and congestion for 2 days, COPD, CHF,
hypertension, former smoker, type II diabetes mellitus

EXAM:
CHEST  2 VIEW

[chest pa]
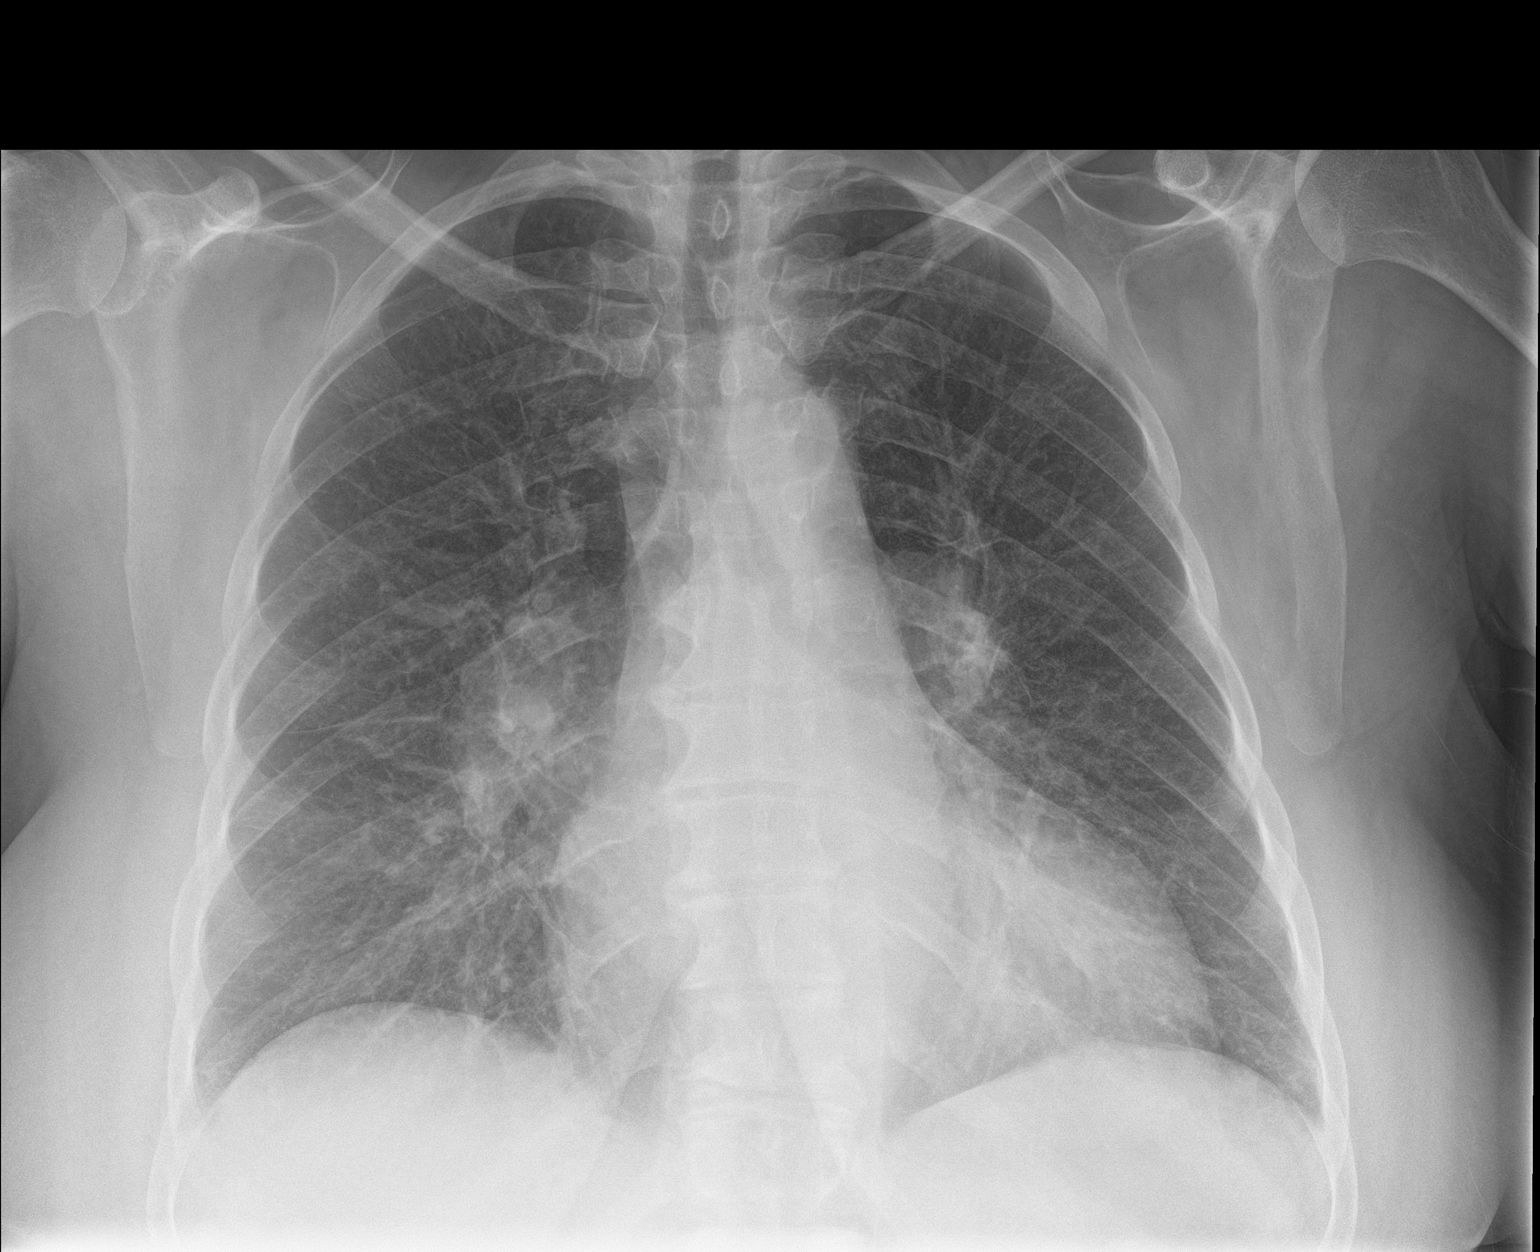

[chest lat]
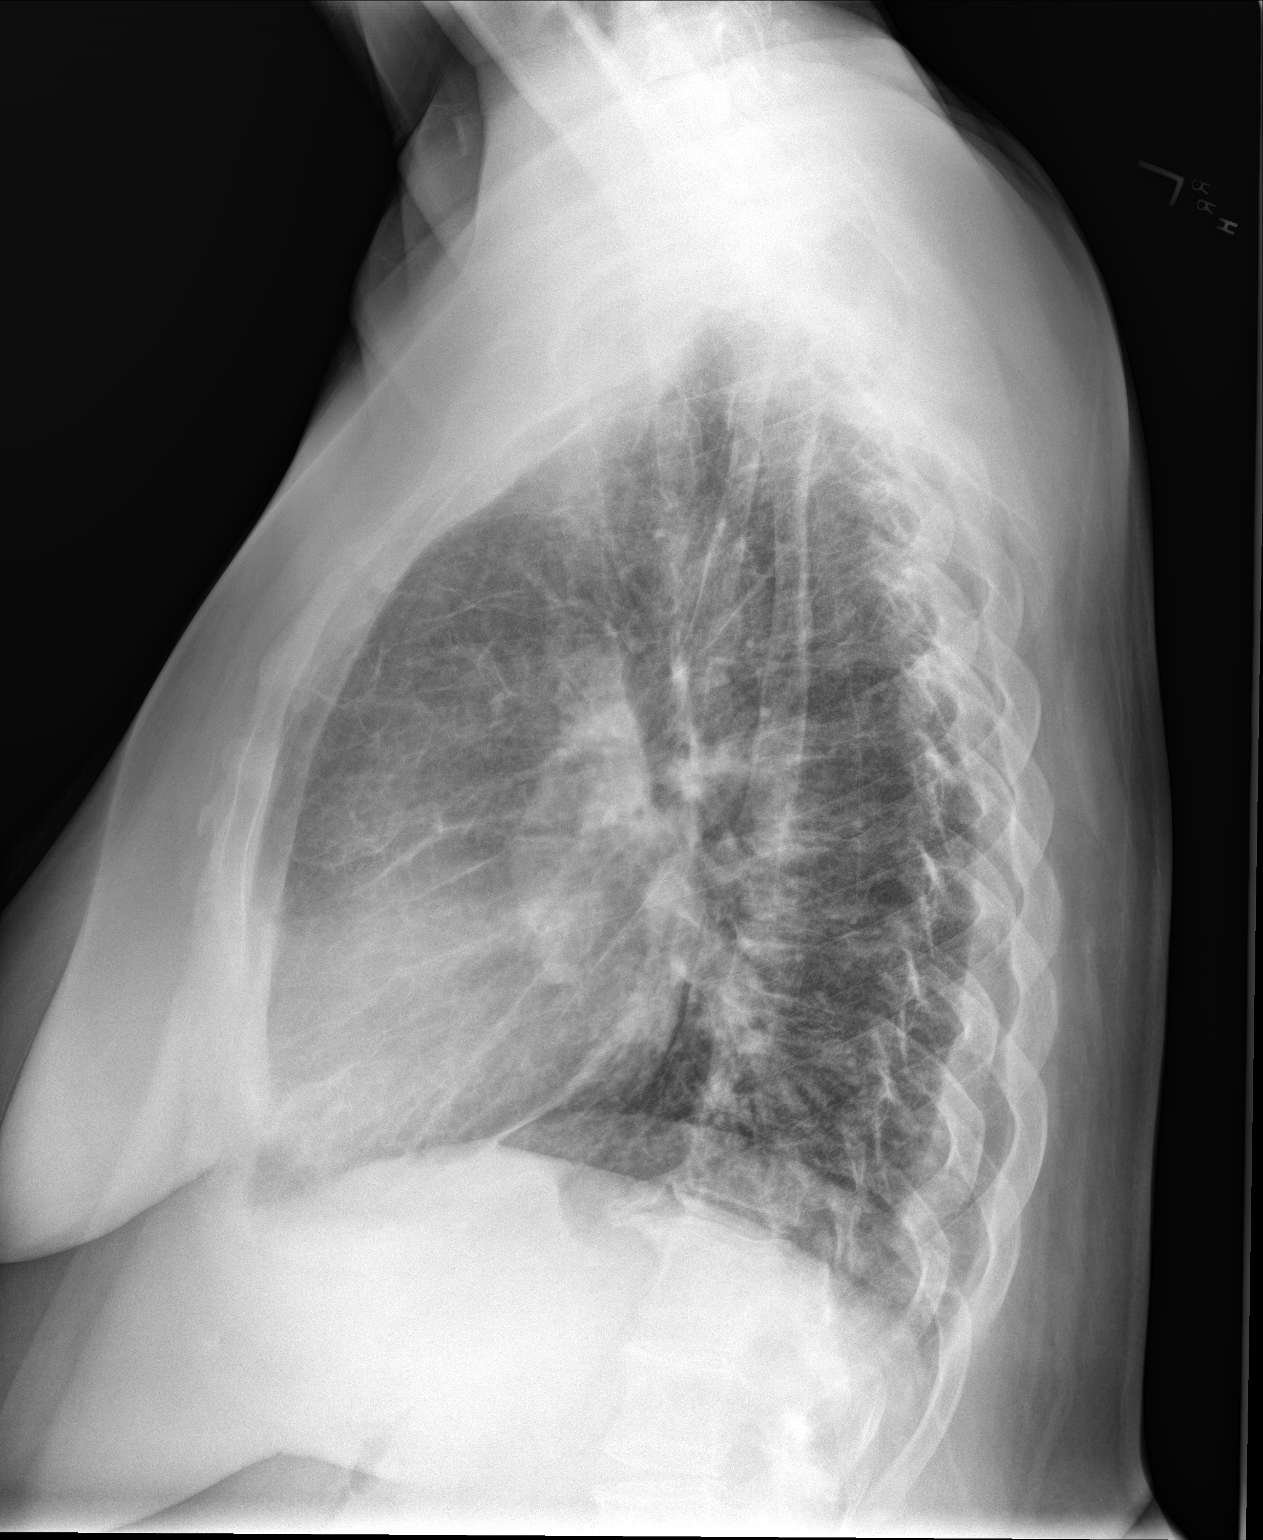

[2 of 2 positions shown; findings below may reference images not displayed]

FINDINGS: Enlargement of cardiac silhouette.

Mild tortuosity of thoracic aorta.

Prominence of the central pulmonary arteries and hila unchanged from
prior exam.

Increased interstitial markings versus [DATE] exam, with [place]
B-lines at the lateral bases, question minimal pulmonary edema.

No definite acute infiltrate, pleural effusion or pneumothorax.

Scattered endplate spur formation thoracic spine.
IMPRESSION: Enlargement of cardiac silhouette.

Enlarged central pulmonary arteries suggesting pulmonary arterial
hypertension.

Question minimal pulmonary edema.

## 2015-09-14 MED ORDER — FUROSEMIDE 10 MG/ML IJ SOLN
40.0000 mg | Freq: Once | INTRAMUSCULAR | Status: AC
  Administered 2015-09-14: 40 mg via INTRAVENOUS
  Filled 2015-09-14: qty 4

## 2015-09-14 MED ORDER — FUROSEMIDE 20 MG PO TABS: 20.0000 mg | ORAL_TABLET | Freq: Two times a day (BID) | ORAL | Status: DC

## 2015-09-14 MED ORDER — ONDANSETRON HCL 4 MG/2ML IJ SOLN
4.0000 mg | Freq: Once | INTRAMUSCULAR | Status: AC
  Administered 2015-09-14: 4 mg via INTRAMUSCULAR
  Filled 2015-09-14: qty 2

## 2015-09-14 MED ORDER — ALBUTEROL SULFATE HFA 108 (90 BASE) MCG/ACT IN AERS
2.0000 | INHALATION_SPRAY | Freq: Once | RESPIRATORY_TRACT | Status: AC
  Administered 2015-09-14: 2 via RESPIRATORY_TRACT
  Filled 2015-09-14: qty 6.7

## 2015-09-14 MED ORDER — PROMETHAZINE-CODEINE 6.25-10 MG/5ML PO SYRP: ORAL_SOLUTION | ORAL | Status: DC

## 2015-09-14 MED ORDER — PHENYLEPHRINE HCL 0.5 % NA SOLN
2.0000 [drp] | Freq: Once | NASAL | Status: AC
  Administered 2015-09-14: 2 [drp] via NASAL
  Filled 2015-09-14: qty 15

## 2015-09-14 MED ORDER — HYDROCOD POLST-CPM POLST ER 10-8 MG/5ML PO SUER
5.0000 mL | Freq: Once | ORAL | Status: AC
  Administered 2015-09-14: 5 mL via ORAL
  Filled 2015-09-14: qty 5

## 2015-09-14 MED ORDER — MORPHINE SULFATE (PF) 4 MG/ML IV SOLN
4.0000 mg | Freq: Once | INTRAVENOUS | Status: AC
  Administered 2015-09-14: 4 mg via INTRAVENOUS
  Filled 2015-09-14: qty 1

## 2015-09-14 NOTE — ED Notes (Addendum)
PT stated she was treated for ear infection in ED on 06/28/15 and stated continued issues with ear since then and stated increased in pain to right ear x2 days with cough and body aches. PT states productive cough at times with green sputum x2 days.

## 2015-09-14 NOTE — Discharge Instructions (Signed)
Your test and x-rays suggest an you have an exacerbation of your chronic lung disease, complicated by an upper respiratory infection. Please use your albuterol 2 puffs every 4 hours. Please use 2 squirts of Afrin every 8 hours for 5 days only. Please use Tylenol or ibuprofen for minor aches and fever. Your chest x-ray suggest a mild increase in fluid present. Please use Lasix 2 times daily. Please review the foods that are rich in potassium to prevent low potassium while taking the Lasix. Please see the physicians [facility] for follow-up of your multiple medical issues.

## 2015-09-14 NOTE — ED Provider Notes (Signed)
CSN: [id number]     Arrival date & time 09/14/15  [id number] History   First MD Initiated Contact with Patient 09/14/15 0901     Chief Complaint  Patient presents with  . Otalgia     (Consider location/radiation/quality/duration/timing/severity/associated sxs/prior Treatment) Patient is a 50 y.o. female presenting with ear pain. The history is provided by the patient.  Otalgia Location:  Right Behind ear:  No abnormality Quality:  Burning Severity:  Moderate Onset quality:  Gradual Duration:  2 days Timing:  Intermittent Progression:  Worsening Chronicity: acute on chronic. Context: not direct blow and not foreign body in ear   Relieved by:  Nothing Ineffective treatments:  OTC medications Associated symptoms: cough   Associated symptoms: no rash   Associated symptoms comment:  Bodyaches Cough:    Cough characteristics:  Productive   Sputum characteristics:  Green   Severity:  Moderate   Onset quality:  Gradual   Duration:  2 days   Timing:  Intermittent   Progression:  Worsening   Chronicity:  New Risk factors: no recent travel     Past Medical History  Diagnosis Date  . Type 2 diabetes mellitus ([place])   . Essential hypertension, benign   . GERD (gastroesophageal reflux disease)   . COPD (chronic obstructive pulmonary disease) ([place])   . Arthritis   . Normal coronary arteries     3/10 - following abnormal Myoview  . Diabetes mellitus   . CHF (congestive heart failure) ([place])   . Chronic abdominal pain   . Ovarian cyst   . Gout   . Gout 2016   Past Surgical History  Procedure Laterality Date  . Scar revision  09/10/2011    Procedure: SCAR REVISION;  Surgeon: Jonnie Kind, MD;  Location: [facility];  Service: Gynecology;  Laterality: N/A;  Wide Excision of old Cicatrix  . Cesarean section  [id number], and 1994  . Abdominal hysterectomy  09/10/2011    Procedure: HYSTERECTOMY ABDOMINAL;  Surgeon: Jonnie Kind, MD;  Location: [facility];  Service: Gynecology;  Laterality: N/A;   Abdominal hysterectomy  . Cholecystectomy  1995  . Tubal ligation  1994   Family History  Problem Relation Age of Onset  . Cirrhosis Mother   . Diabetes type II Father   . Diabetes type II Sister   . Anesthesia problems Neg Hx   . Hypotension Neg Hx   . Malignant hyperthermia Neg Hx   . Pseudochol deficiency Neg Hx    Social History  Substance Use Topics  . Smoking status: Former Smoker -- 0.25 packs/day for 29 years    Types: Cigarettes    Quit date: 08/31/2015  . Smokeless tobacco: Never Used     Comment: 1 cigarette every 3 days  . Alcohol Use: 0.0 oz/week     Comment: "once in a while"   OB History    Gravida Para Term Preterm AB TAB SAB Ectopic Multiple Living            3     Review of Systems  HENT: Positive for ear pain.   Respiratory: Positive for cough.   Skin: Negative for rash.      Allergies  Bee venom; Naproxen; and Penicillins  Home Medications   Prior to Admission medications   Medication Sig Start Date End Date Taking? Authorizing Provider  albuterol (PROVENTIL HFA;VENTOLIN HFA) 108 (90 BASE) MCG/ACT inhaler Inhale 1-2 puffs into the lungs every 6 (six) hours as needed for wheezing. 08/25/12   Aaron Edelman  Lacinda Axon, MD  benzonatate (TESSALON) 100 MG capsule 1 - 2 po q 8 hour prn cough 08/21/15   Soyla Dryer, PA-C  guaiFENesin-codeine (ROBITUSSIN AC) 100-10 MG/5ML syrup Take 10 mLs by mouth 3 (three) times daily as needed. 06/28/15   Tammy Triplett, PA-C  Insulin Glargine (LANTUS SOLOSTAR) 100 UNIT/ML Solostar Pen Inject 15 Units into the skin at bedtime.     Historical Provider, MD  losartan-hydrochlorothiazide (HYZAAR) 100-12.5 MG tablet Take 1 tablet by mouth daily. 06/07/15   Soyla Dryer, PA-C  metoprolol (LOPRESSOR) 100 MG tablet Take 1 tablet (100 mg total) by mouth 2 (two) times daily. 08/21/15   Soyla Dryer, PA-C  omeprazole (PRILOSEC) 20 MG capsule Take 1 capsule (20 mg total) by mouth daily. Patient taking differently: Take 40 mg by mouth  daily.  08/16/14   Francine Graven, DO  Probiotic Product (ALIGN PO) Take by mouth daily.    Historical Provider, MD  sitaGLIPtin-metformin (JANUMET) 50-1000 MG tablet Take 1 tablet by mouth 2 (two) times daily with a meal. 06/07/15   Soyla Dryer, PA-C   BP 187/120 mmHg  Pulse 66  Temp(Src) 98.5 F (36.9 C) (Oral)  Resp 18  Ht 5\' 6"  (1.676 m)  Wt 96.163 kg  BMI 34.23 kg/m2  SpO2 98%  LMP 08/21/2011 Physical Exam  Constitutional: She is oriented to [place], place, [name]  Non-toxic appearance.  HENT:  Head: Normocephalic.  Right Ear: Tympanic membrane and external ear normal.  Left Ear: Tympanic membrane and external ear normal.  Nasal congestion. Mild increase redness present of the posterior pharynx.  Eyes: EOM and lids are normal. Pupils are equal, round, and reactive to light.  Neck: Normal range of motion. Neck supple. Carotid bruit is not present.  Cardiovascular: Normal rate, regular rhythm, normal heart sounds, intact distal pulses and normal pulses.  Exam reveals no friction rub.   Pulmonary/Chest: No respiratory distress. She has rhonchi.  Pt speaks in complete sentences.  Abdominal: Soft. Bowel sounds are normal. There is no tenderness. There is no guarding.  Musculoskeletal: Normal range of motion.  Lymphadenopathy:       Head (right side): No submandibular adenopathy present.       Head (left side): No submandibular adenopathy present.    She has no cervical adenopathy.  Neurological: She is alert and oriented to [place], place, and time. She has normal strength. No cranial nerve deficit or sensory deficit.  Skin: Skin is warm and dry.  Psychiatric: She has a normal mood and affect. Her speech is normal.  Nursing note and vitals reviewed.   ED Course  Procedures (including critical care time) Labs Review Labs Reviewed - No data to display  Imaging Review No results found. I have personally reviewed and  evaluated these images and lab results as part of my medical decision-making.   EKG Interpretation None      MDM  Vital signs reviewed. Pulse ox 96 to 100% on room air. Suspect ear complaints are related to URI. Exam favors exacerbation of COPD and Pulmonary Edema (mild). Rx for lasix and promethazine codeine for cough given to the patient. Afrin given to the patient to use for 5 days.    Final diagnoses:  COPD exacerbation ([place])  URI (upper respiratory infection)  Chronic congestive heart failure, unspecified congestive heart failure type (HCC)    **I have reviewed nursing notes, vital signs, and all appropriate lab and imaging results for this patient.Lily Kocher,  PA-C 09/16/15 2157  Merrily Pew, MD 09/25/15 [phone]

## 2015-10-02 ENCOUNTER — Emergency Department (HOSPITAL_COMMUNITY)
Admission: EM | Admit: 2015-10-02 | Discharge: 2015-10-02 | Disposition: A | Discharge status: Home / Self Care | Payer: Self-pay | Attending: Emergency Medicine | Admitting: Emergency Medicine

## 2015-10-02 ENCOUNTER — Encounter (HOSPITAL_COMMUNITY): Payer: Self-pay | Admitting: *Deleted

## 2015-10-02 DIAGNOSIS — I10 Essential (primary) hypertension: Secondary | ICD-10-CM | POA: Insufficient documentation

## 2015-10-02 DIAGNOSIS — Z794 Long term (current) use of insulin: Secondary | ICD-10-CM | POA: Insufficient documentation

## 2015-10-02 DIAGNOSIS — Z79899 Other long term (current) drug therapy: Secondary | ICD-10-CM | POA: Insufficient documentation

## 2015-10-02 DIAGNOSIS — Z8719 Personal history of other diseases of the digestive system: Secondary | ICD-10-CM | POA: Insufficient documentation

## 2015-10-02 DIAGNOSIS — G8929 Other chronic pain: Secondary | ICD-10-CM | POA: Insufficient documentation

## 2015-10-02 DIAGNOSIS — Z8739 Personal history of other diseases of the musculoskeletal system and connective tissue: Secondary | ICD-10-CM | POA: Insufficient documentation

## 2015-10-02 DIAGNOSIS — Z880 Allergy status to penicillin: Secondary | ICD-10-CM | POA: Insufficient documentation

## 2015-10-02 DIAGNOSIS — Z8742 Personal history of other diseases of the female genital tract: Secondary | ICD-10-CM | POA: Insufficient documentation

## 2015-10-02 DIAGNOSIS — R197 Diarrhea, unspecified: Secondary | ICD-10-CM

## 2015-10-02 DIAGNOSIS — I509 Heart failure, unspecified: Secondary | ICD-10-CM | POA: Insufficient documentation

## 2015-10-02 DIAGNOSIS — R1013 Epigastric pain: Secondary | ICD-10-CM | POA: Insufficient documentation

## 2015-10-02 DIAGNOSIS — J069 Acute upper respiratory infection, unspecified: Secondary | ICD-10-CM

## 2015-10-02 DIAGNOSIS — J449 Chronic obstructive pulmonary disease, unspecified: Secondary | ICD-10-CM | POA: Insufficient documentation

## 2015-10-02 DIAGNOSIS — E876 Hypokalemia: Secondary | ICD-10-CM

## 2015-10-02 DIAGNOSIS — Z87891 Personal history of nicotine dependence: Secondary | ICD-10-CM | POA: Insufficient documentation

## 2015-10-02 DIAGNOSIS — E119 Type 2 diabetes mellitus without complications: Secondary | ICD-10-CM | POA: Insufficient documentation

## 2015-10-02 LAB — CBC WITH DIFFERENTIAL/PLATELET
Basophils Absolute: 0 10*3/uL (ref 0.0–0.1)
Basophils Relative: 0 %
Eosinophils Absolute: 0.1 10*3/uL (ref 0.0–0.7)
Eosinophils Relative: 1 %
HCT: 43.4 % (ref 36.0–46.0)
Hemoglobin: 14.7 g/dL (ref 12.0–15.0)
Lymphocytes Relative: 40 %
Lymphs Abs: 1.9 10*3/uL (ref 0.7–4.0)
MCH: 30.1 pg (ref 26.0–34.0)
MCHC: 33.9 g/dL (ref 30.0–36.0)
MCV: 88.8 fL (ref 78.0–100.0)
Monocytes Absolute: 0.3 10*3/uL (ref 0.1–1.0)
Monocytes Relative: 6 %
Neutro Abs: 2.5 10*3/uL (ref 1.7–7.7)
Neutrophils Relative %: 53 %
Platelets: 207 10*3/uL (ref 150–400)
RBC: 4.89 MIL/uL (ref 3.87–5.11)
RDW: 13.2 % (ref 11.5–15.5)
WBC: 4.8 10*3/uL (ref 4.0–10.5)

## 2015-10-02 LAB — URINALYSIS, ROUTINE W REFLEX MICROSCOPIC
Bilirubin Urine: NEGATIVE
Glucose, UA: 250 mg/dL — AB
Hgb urine dipstick: NEGATIVE
Ketones, ur: NEGATIVE mg/dL
Leukocytes, UA: NEGATIVE
Nitrite: NEGATIVE
Protein, ur: 30 mg/dL — AB
Specific Gravity, Urine: >1.03 — ABNORMAL HIGH (ref 1.005–1.030)
pH: 5.5 (ref 5.0–8.0)

## 2015-10-02 LAB — COMPREHENSIVE METABOLIC PANEL
ALT: 10 U/L — ABNORMAL LOW (ref 14–54)
AST: 15 U/L (ref 15–41)
Albumin: 3.5 g/dL (ref 3.5–5.0)
Alkaline Phosphatase: 76 U/L (ref 38–126)
Anion gap: 8 (ref 5–15)
BUN: 9 mg/dL (ref 6–20)
CO2: 23 mmol/L (ref 22–32)
Calcium: 8.5 mg/dL — ABNORMAL LOW (ref 8.9–10.3)
Chloride: 106 mmol/L (ref 101–111)
Creatinine, Ser: 0.72 mg/dL (ref 0.44–1.00)
GFR calc Af Amer: >60 mL/min (ref >60)
GFR calc non Af Amer: >60 mL/min (ref >60)
Glucose, Bld: 185 mg/dL — ABNORMAL HIGH (ref 65–99)
Potassium: 3.3 mmol/L — ABNORMAL LOW (ref 3.5–5.1)
Sodium: 137 mmol/L (ref 135–145)
Total Bilirubin: 0.7 mg/dL (ref 0.3–1.2)
Total Protein: 6.7 g/dL (ref 6.5–8.1)

## 2015-10-02 LAB — LIPASE, BLOOD: Lipase: 19 U/L (ref 11–51)

## 2015-10-02 LAB — URINE MICROSCOPIC-ADD ON: RBC / HPF: NONE SEEN RBC/hpf (ref 0–5)

## 2015-10-02 MED ORDER — PANTOPRAZOLE SODIUM 40 MG PO TBEC
40.0000 mg | DELAYED_RELEASE_TABLET | Freq: Once | ORAL | Status: AC
  Administered 2015-10-02: 40 mg via ORAL
  Filled 2015-10-02: qty 1

## 2015-10-02 MED ORDER — BENZONATATE 200 MG PO CAPS: 200.0000 mg | ORAL_CAPSULE | Freq: Three times a day (TID) | ORAL | Status: DC | PRN

## 2015-10-02 MED ORDER — ONDANSETRON 8 MG PO TBDP
8.0000 mg | ORAL_TABLET | Freq: Once | ORAL | Status: AC
  Administered 2015-10-02: 8 mg via ORAL
  Filled 2015-10-02: qty 1

## 2015-10-02 MED ORDER — HYDROCODONE-ACETAMINOPHEN 5-325 MG PO TABS: ORAL_TABLET | ORAL | Status: DC

## 2015-10-02 MED ORDER — POTASSIUM CHLORIDE ER 20 MEQ PO TBCR: 40.0000 meq | EXTENDED_RELEASE_TABLET | Freq: Every day | ORAL | Status: DC

## 2015-10-02 NOTE — ED Notes (Signed)
Out of bed to bathroom - Call to lab for labs

## 2015-10-02 NOTE — Discharge Instructions (Signed)
Diarrhea Diarrhea is watery poop (stool). It can make you feel weak, tired, thirsty, or give you a dry mouth (signs of dehydration). Watery poop is a sign of another problem, most often an infection. It often lasts 2-3 days. It can last longer if it is a sign of something serious. Take care of yourself as told by your doctor. HOME CARE   Drink 1 cup (8 ounces) of fluid each time you have watery poop.  Do not drink the following fluids:  Those that contain simple sugars (fructose, glucose, galactose, lactose, sucrose, maltose).  Sports drinks.  Fruit juices.  Whole milk products.  Sodas.  Drinks with caffeine (coffee, tea, soda) or alcohol.  Oral rehydration solution may be used if the doctor says it is okay. You may make your own solution. Follow this recipe:   - teaspoon table salt.   teaspoon baking soda.   teaspoon salt substitute containing potassium chloride.  1 tablespoons sugar.  1 liter (34 ounces) of water.  Avoid the following foods:  High fiber foods, such as raw fruits and vegetables.  Nuts, seeds, and whole grain breads and cereals.   Those that are sweetened with sugar alcohols (xylitol, sorbitol, mannitol).  Try eating the following foods:  Starchy foods, such as rice, toast, pasta, low-sugar cereal, oatmeal, baked potatoes, crackers, and bagels.  Bananas.  Applesauce.  Eat probiotic-rich foods, such as yogurt and milk products that are fermented.  Wash your hands well after each time you have watery poop.  Only take medicine as told by your doctor.  Take a warm bath to help lessen burning or pain from having watery poop. GET HELP RIGHT AWAY IF:   You cannot drink fluids without throwing up (vomiting).  You keep throwing up.  You have blood in your poop, or your poop looks black and tarry.  You do not pee (urinate) in 6-8 hours, or there is only a small amount of very dark pee.  You have belly (abdominal) pain that gets worse or stays  in the same spot (localizes).  You are weak, dizzy, confused, or light-headed.  You have a very bad headache.  Your watery poop gets worse or does not get better.  You have a fever or lasting symptoms for more than 2-3 days.  You have a fever and your symptoms suddenly get worse. MAKE SURE YOU:   Understand these instructions.  Will watch your condition.  Will get help right away if you are not doing well or get worse.   This information is not intended to replace advice given to you by your health care provider. Make sure you discuss any questions you have with your health care provider.   Document Released: 01/08/2008 Document Revised: 08/12/2014 Document Reviewed: 03/29/2012 Elsevier Interactive Patient Education 2016 Elsevier Inc.  Cough, Adult A cough helps to clear your throat and lungs. A cough may last only 2-3 weeks (acute), or it may last longer than 8 weeks (chronic). Many different things can cause a cough. A cough may be a sign of an illness or another medical condition. HOME CARE  Pay attention to any changes in your cough.  Take medicines only as told by your doctor.  If you were prescribed an antibiotic medicine, take it as told by your doctor. Do not stop taking it even if you start to feel better.  Talk with your doctor before you try using a cough medicine.  Drink enough fluid to keep your pee (urine) clear or pale  yellow.  If the air is dry, use a cold steam vaporizer or humidifier in your home.  Stay away from things that make you cough at work or at home.  If your cough is worse at night, try using extra pillows to raise your head up higher while you sleep.  Do not smoke, and try not to be around smoke. If you need help quitting, ask your doctor.  Do not have caffeine.  Do not drink alcohol.  Rest as needed. GET HELP IF:  You have new problems (symptoms).  You cough up yellow fluid (pus).  Your cough does not get better after 2-3 weeks,  or your cough gets worse.  Medicine does not help your cough and you are not sleeping well.  You have pain that gets worse or pain that is not helped with medicine.  You have a fever.  You are losing weight and you do not know why.  You have night sweats. GET HELP RIGHT AWAY IF:  You cough up blood.  You have trouble breathing.  Your heartbeat is very fast.   This information is not intended to replace advice given to you by your health care provider. Make sure you discuss any questions you have with your health care provider.   Document Released: 04/04/2011 Document Revised: 04/12/2015 Document Reviewed: 09/28/2014 Elsevier Interactive Patient Education [employer].

## 2015-10-02 NOTE — ED Provider Notes (Signed)
CSN: [id number]     Arrival date & time 10/02/15  1439 History  By signing my name below, I, Emmanuella Mensah, attest that this documentation has been prepared under the direction and in the presence of Dacari Beckstrand, PA-C. Electronically Signed: Judithann Sauger, ED Scribe. 10/02/2015. 3:22 PM.    Chief Complaint  Patient presents with  . Cough  . Diarrhea   Patient is a 50 y.o. female presenting with cough and diarrhea. The history is provided by the patient. No language interpreter was used.  Cough Cough characteristics:  Non-productive Severity:  Moderate Onset quality:  Gradual Duration:  1 day Timing:  Intermittent Progression:  Worsening Chronicity:  New Context: not sick contacts   Relieved by:  None tried Worsened by:  Nothing tried Ineffective treatments:  None tried Associated symptoms: no chills, no shortness of breath and no wheezing   Diarrhea Associated symptoms: abdominal pain   Associated symptoms: no chills    HPI Comments: Robin [name] is a 50 y.o. female with a hx of DM II, GERD, COPD, and CHF who presents to the Emergency Department complaining of gradually worsening persistent moderate non-productive cough, nasal congestion and intermittent fevers onset yesterday. She reports associated sharp non-radiating upper abdominal pain, and non-bloody watery diarrhea that also began at onset of the coughing.  No alleviating factors noted. Pt has not tried any medications for her symptoms. She denies any sick contacts.   She denies any recent antibiotic use, vomiting, chest pain, dysuria, or SOB.     Past Medical History  Diagnosis Date  . Type 2 diabetes mellitus ([place])   . Essential hypertension, benign   . GERD (gastroesophageal reflux disease)   . COPD (chronic obstructive pulmonary disease) ([place])   . Arthritis   . Normal coronary arteries     3/10 - following abnormal Myoview  . Diabetes mellitus   . CHF (congestive heart failure) ([place])   . Chronic  abdominal pain   . Ovarian cyst   . Gout   . Gout 2016   Past Surgical History  Procedure Laterality Date  . Scar revision  09/10/2011    Procedure: SCAR REVISION;  Surgeon: Jonnie Kind, MD;  Location: [facility];  Service: Gynecology;  Laterality: N/A;  Wide Excision of old Cicatrix  . Cesarean section  [id number], and 1994  . Abdominal hysterectomy  09/10/2011    Procedure: HYSTERECTOMY ABDOMINAL;  Surgeon: Jonnie Kind, MD;  Location: [facility];  Service: Gynecology;  Laterality: N/A;  Abdominal hysterectomy  . Cholecystectomy  1995  . Tubal ligation  1994   Family History  Problem Relation Age of Onset  . Cirrhosis Mother   . Diabetes type II Father   . Diabetes type II Sister   . Anesthesia problems Neg Hx   . Hypotension Neg Hx   . Malignant hyperthermia Neg Hx   . Pseudochol deficiency Neg Hx    Social History  Substance Use Topics  . Smoking status: Former Smoker -- 0.25 packs/day for 29 years    Types: Cigarettes    Quit date: 08/31/2015  . Smokeless tobacco: Never Used     Comment: 1 cigarette every 3 days  . Alcohol Use: No   OB History    Gravida Para Term Preterm AB TAB SAB Ectopic Multiple Living            3     Review of Systems  Constitutional: Negative for chills.  Respiratory: Positive for cough. Negative for shortness of  breath and wheezing.   Gastrointestinal: Positive for abdominal pain and diarrhea.    A complete 10 system review of systems was obtained and all systems are negative except as noted in the HPI and PMH.    Allergies  Bee venom; Naproxen; and Penicillins  Home Medications   Prior to Admission medications   Medication Sig Start Date End Date Taking? Authorizing Provider  albuterol (PROVENTIL HFA;VENTOLIN HFA) 108 (90 BASE) MCG/ACT inhaler Inhale 1-2 puffs into the lungs every 6 (six) hours as needed for wheezing. 08/25/12   Nat Christen, MD  benzonatate (TESSALON) 100 MG capsule 1 - 2 po q 8 hour prn cough Patient not taking: Reported  on 09/14/2015 08/21/15   Soyla Dryer, PA-C  furosemide (LASIX) 20 MG tablet Take 1 tablet (20 mg total) by mouth 2 (two) times daily. 09/14/15   Lily Kocher, PA-C  guaiFENesin-codeine (ROBITUSSIN AC) 100-10 MG/5ML syrup Take 10 mLs by mouth 3 (three) times daily as needed. Patient not taking: Reported on 09/14/2015 06/28/15   Ata Pecha, PA-C  Insulin Glargine (LANTUS SOLOSTAR) 100 UNIT/ML Solostar Pen Inject 20 Units into the skin at bedtime.     Historical Provider, MD  losartan-hydrochlorothiazide (HYZAAR) 100-12.5 MG tablet Take 1 tablet by mouth daily. 06/07/15   Soyla Dryer, PA-C  metoprolol (LOPRESSOR) 100 MG tablet Take 1 tablet (100 mg total) by mouth 2 (two) times daily. Patient taking differently: Take 50 mg by mouth 2 (two) times daily.  08/21/15   Soyla Dryer, PA-C  omeprazole (PRILOSEC) 20 MG capsule Take 1 capsule (20 mg total) by mouth daily. Patient taking differently: Take 40 mg by mouth daily.  08/16/14   Francine Graven, DO  Probiotic Product (ALIGN PO) Take by mouth daily.    Historical Provider, MD  promethazine-codeine (PHENERGAN WITH CODEINE) 6.25-10 MG/5ML syrup 5 or 10 ml po q6h prn cough 09/14/15   Lily Kocher, PA-C  sitaGLIPtin-metformin (JANUMET) 50-1000 MG tablet Take 1 tablet by mouth 2 (two) times daily with a meal. 06/07/15   Soyla Dryer, PA-C   BP 177/96 mmHg  Pulse 92  Temp(Src) 98.8 F (37.1 C) (Oral)  Resp 16  Ht 5\' 6"  (1.676 m)  Wt 204 lb (92.534 kg)  BMI 32.94 kg/m2  SpO2 97%  LMP 08/21/2011 Physical Exam  Constitutional: She is oriented to [place], place, [name]  HENT:  Head: Normocephalic and atraumatic.  Mouth/Throat: Oropharynx is clear and moist. No oropharyngeal exudate.  Cardiovascular: Normal rate and regular rhythm.   Pulmonary/Chest: Effort normal and breath sounds normal. No respiratory distress. She has no wheezes. She has no rales.  Abdominal: She exhibits no distension.  There is no rebound and no guarding.  Mild to moderate epigastric tenderness on exam Hyperactive bowel sounds  Neurological: She is alert and oriented to [place], place, and time.  Skin: Skin is warm and dry.  Psychiatric: She has a normal mood and affect.  Nursing note and vitals reviewed.   ED Course  Procedures (including critical care time) DIAGNOSTIC STUDIES: Oxygen Saturation is 97% on RA, normal by my interpretation.    COORDINATION OF CARE: 3:18 PM- Pt advised of plan for treatment and pt agrees. Pt will receive lab work for further evaluation. She will also receive PO Zofran and Protonix.    Labs Review Labs Reviewed  COMPREHENSIVE METABOLIC PANEL - Abnormal; Notable for the following:    Potassium 3.3 (*)    Glucose, Bld 185 (*)    Calcium 8.5 (*)  ALT 10 (*)    All other components within normal limits  URINALYSIS, ROUTINE W REFLEX MICROSCOPIC (NOT AT [facility]) - Abnormal; Notable for the following:    Specific Gravity, Urine >1.030 (*)    Glucose, UA 250 (*)    Protein, ur 30 (*)    All other components within normal limits  URINE MICROSCOPIC-ADD ON - Abnormal; Notable for the following:    Squamous Epithelial / LPF TOO NUMEROUS TO COUNT (*)    Bacteria, UA MANY (*)    All other components within normal limits  LIPASE, BLOOD  CBC WITH DIFFERENTIAL/PLATELET    Jazlyn Tippens, PA-C has personally reviewed and evaluated these lab results as part of her medical decision-making.   MDM   Final diagnoses:  Diarrhea, unspecified type  URI (upper respiratory infection)  Hypokalemia    Pt with sudden onset of URI sx's and diarrhea.  Likely  Viral process.  Vitals remain stable.  Well appearing, non-toxic.  No concerning sx's for acute abdomen, hx of cholecystectomy, no RLQ tenderness on exam.  No recent abx to suggest C diff.  Pt appears stable for d/c, ambulates with steady gait.  Feels better after medications. Hypokalemia likely related to the diarrhea, I will have  her start supplementation and close recheck in one week. Symptomatic tx of URI symptoms.   Return precautions also given.    I personally performed the services described in this documentation, which was scribed in my presence. The recorded information has been reviewed and is accurate.   Bufford Lope 10/03/15 2127  Merrily Pew, MD 10/04/15 [phone]

## 2015-10-02 NOTE — ED Notes (Signed)
Pt comes in with cough starting yesterday with fevers present. Pt denies emesis but states she has had diarrhea.

## 2015-10-23 ENCOUNTER — Encounter (HOSPITAL_COMMUNITY): Payer: Self-pay | Admitting: Emergency Medicine

## 2015-10-23 ENCOUNTER — Emergency Department (HOSPITAL_COMMUNITY)
Admission: EM | Admit: 2015-10-23 | Discharge: 2015-10-23 | Disposition: A | Discharge status: Home / Self Care | Payer: Self-pay | Attending: Dermatology | Admitting: Dermatology

## 2015-10-23 DIAGNOSIS — Z87891 Personal history of nicotine dependence: Secondary | ICD-10-CM | POA: Insufficient documentation

## 2015-10-23 DIAGNOSIS — I110 Hypertensive heart disease with heart failure: Secondary | ICD-10-CM | POA: Insufficient documentation

## 2015-10-23 DIAGNOSIS — R110 Nausea: Secondary | ICD-10-CM | POA: Insufficient documentation

## 2015-10-23 DIAGNOSIS — E119 Type 2 diabetes mellitus without complications: Secondary | ICD-10-CM | POA: Insufficient documentation

## 2015-10-23 DIAGNOSIS — I509 Heart failure, unspecified: Secondary | ICD-10-CM | POA: Insufficient documentation

## 2015-10-23 DIAGNOSIS — J449 Chronic obstructive pulmonary disease, unspecified: Secondary | ICD-10-CM | POA: Insufficient documentation

## 2015-10-23 DIAGNOSIS — R1084 Generalized abdominal pain: Secondary | ICD-10-CM | POA: Insufficient documentation

## 2015-10-23 DIAGNOSIS — Z5321 Procedure and treatment not carried out due to patient leaving prior to being seen by health care provider: Secondary | ICD-10-CM | POA: Insufficient documentation

## 2015-10-23 NOTE — ED Notes (Signed)
Pt called in waiting area with no answer.

## 2015-10-23 NOTE — ED Notes (Signed)
Pt reports generalized abdominal pain with nausea that began this morning. Denies v/d.

## 2015-10-24 ENCOUNTER — Emergency Department (HOSPITAL_COMMUNITY): Payer: Self-pay

## 2015-10-24 ENCOUNTER — Emergency Department (HOSPITAL_COMMUNITY)
Admission: EM | Admit: 2015-10-24 | Discharge: 2015-10-24 | Disposition: A | Discharge status: Home / Self Care | Payer: Self-pay | Attending: Emergency Medicine | Admitting: Emergency Medicine

## 2015-10-24 ENCOUNTER — Encounter (HOSPITAL_COMMUNITY): Payer: Self-pay

## 2015-10-24 DIAGNOSIS — E119 Type 2 diabetes mellitus without complications: Secondary | ICD-10-CM | POA: Insufficient documentation

## 2015-10-24 DIAGNOSIS — Z7984 Long term (current) use of oral hypoglycemic drugs: Secondary | ICD-10-CM | POA: Insufficient documentation

## 2015-10-24 DIAGNOSIS — I509 Heart failure, unspecified: Secondary | ICD-10-CM | POA: Insufficient documentation

## 2015-10-24 DIAGNOSIS — J449 Chronic obstructive pulmonary disease, unspecified: Secondary | ICD-10-CM | POA: Insufficient documentation

## 2015-10-24 DIAGNOSIS — I110 Hypertensive heart disease with heart failure: Secondary | ICD-10-CM | POA: Insufficient documentation

## 2015-10-24 DIAGNOSIS — Z87891 Personal history of nicotine dependence: Secondary | ICD-10-CM | POA: Insufficient documentation

## 2015-10-24 DIAGNOSIS — R197 Diarrhea, unspecified: Secondary | ICD-10-CM | POA: Insufficient documentation

## 2015-10-24 DIAGNOSIS — Z794 Long term (current) use of insulin: Secondary | ICD-10-CM | POA: Insufficient documentation

## 2015-10-24 DIAGNOSIS — J111 Influenza due to unidentified influenza virus with other respiratory manifestations: Secondary | ICD-10-CM | POA: Insufficient documentation

## 2015-10-24 DIAGNOSIS — Z79899 Other long term (current) drug therapy: Secondary | ICD-10-CM | POA: Insufficient documentation

## 2015-10-24 DIAGNOSIS — R69 Illness, unspecified: Secondary | ICD-10-CM

## 2015-10-24 LAB — COMPREHENSIVE METABOLIC PANEL
ALT: 14 U/L (ref 14–54)
AST: 14 U/L — ABNORMAL LOW (ref 15–41)
Albumin: 3.9 g/dL (ref 3.5–5.0)
Alkaline Phosphatase: 86 U/L (ref 38–126)
Anion gap: 6 (ref 5–15)
BUN: 6 mg/dL (ref 6–20)
CO2: 27 mmol/L (ref 22–32)
Calcium: 8.9 mg/dL (ref 8.9–10.3)
Chloride: 105 mmol/L (ref 101–111)
Creatinine, Ser: 0.64 mg/dL (ref 0.44–1.00)
GFR calc Af Amer: >60 mL/min (ref >60)
GFR calc non Af Amer: >60 mL/min (ref >60)
Glucose, Bld: 132 mg/dL — ABNORMAL HIGH (ref 65–99)
Potassium: 3.8 mmol/L (ref 3.5–5.1)
Sodium: 138 mmol/L (ref 135–145)
Total Bilirubin: 0.8 mg/dL (ref 0.3–1.2)
Total Protein: 7.3 g/dL (ref 6.5–8.1)

## 2015-10-24 LAB — URINALYSIS, ROUTINE W REFLEX MICROSCOPIC
Bilirubin Urine: NEGATIVE
Glucose, UA: NEGATIVE mg/dL
Ketones, ur: NEGATIVE mg/dL
Leukocytes, UA: NEGATIVE
Nitrite: NEGATIVE
Protein, ur: NEGATIVE mg/dL
Specific Gravity, Urine: 1.015 (ref 1.005–1.030)
pH: 6 (ref 5.0–8.0)

## 2015-10-24 LAB — CBC WITH DIFFERENTIAL/PLATELET
Basophils Absolute: 0 10*3/uL (ref 0.0–0.1)
Basophils Relative: 0 %
Eosinophils Absolute: 0.1 10*3/uL (ref 0.0–0.7)
Eosinophils Relative: 1 %
HCT: 43 % (ref 36.0–46.0)
Hemoglobin: 14.9 g/dL (ref 12.0–15.0)
Lymphocytes Relative: 24 %
Lymphs Abs: 2 10*3/uL (ref 0.7–4.0)
MCH: 30.5 pg (ref 26.0–34.0)
MCHC: 34.7 g/dL (ref 30.0–36.0)
MCV: 87.9 fL (ref 78.0–100.0)
Monocytes Absolute: 0.6 10*3/uL (ref 0.1–1.0)
Monocytes Relative: 7 %
Neutro Abs: 5.5 10*3/uL (ref 1.7–7.7)
Neutrophils Relative %: 68 %
Platelets: 220 10*3/uL (ref 150–400)
RBC: 4.89 MIL/uL (ref 3.87–5.11)
RDW: 12.8 % (ref 11.5–15.5)
WBC: 8.3 10*3/uL (ref 4.0–10.5)

## 2015-10-24 LAB — LIPASE, BLOOD: Lipase: 23 U/L (ref 11–51)

## 2015-10-24 LAB — URINE MICROSCOPIC-ADD ON

## 2015-10-24 LAB — CBG MONITORING, ED: Glucose-Capillary: 131 mg/dL — ABNORMAL HIGH (ref 65–99)

## 2015-10-24 IMAGING — DX DG CHEST 2V
2 series · 2 of 2 positions shown · non-contrast
Comparison: [DATE]

CLINICAL DATA: Shortness of breath and cough

EXAM:
CHEST  2 VIEW

[chest pa]
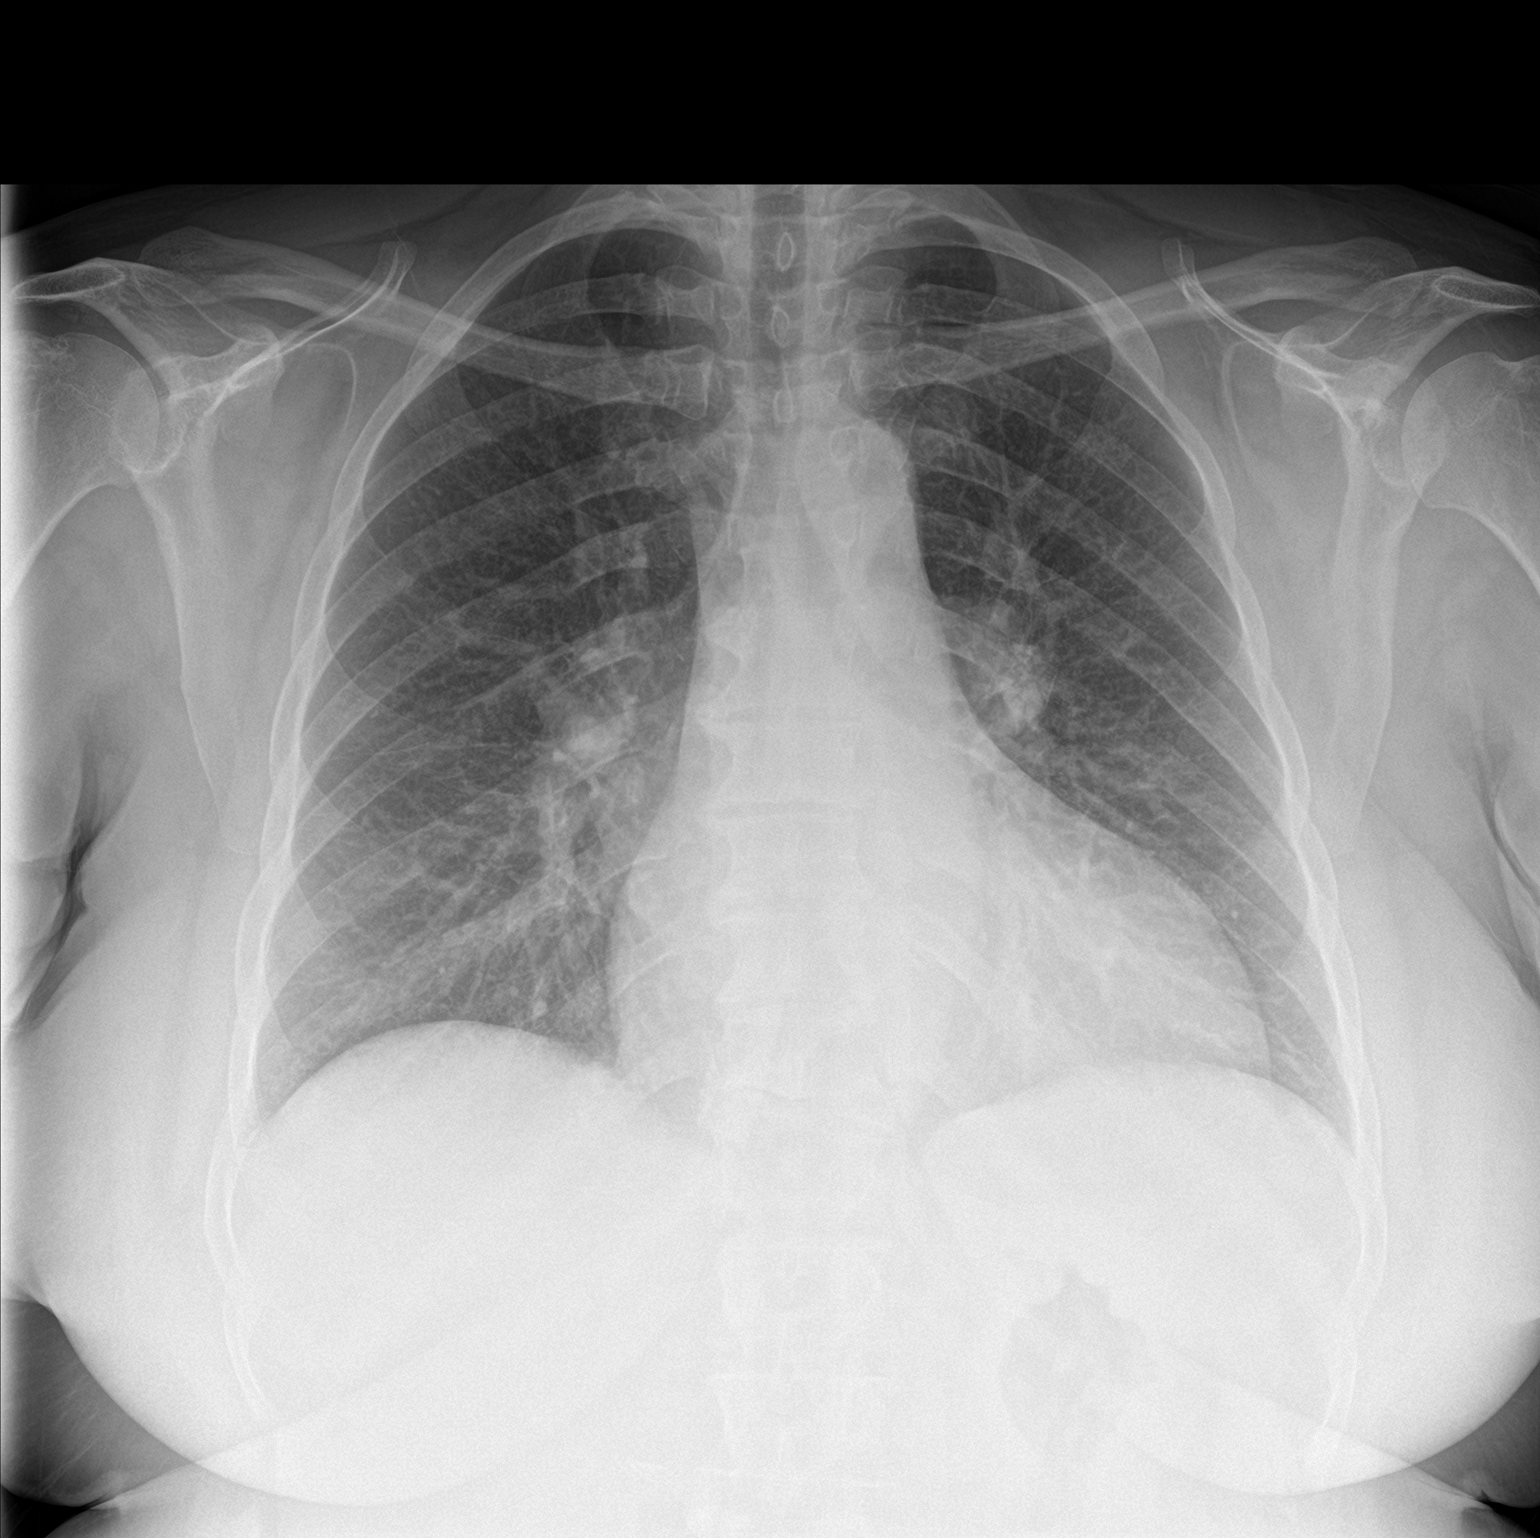

[chest lat]
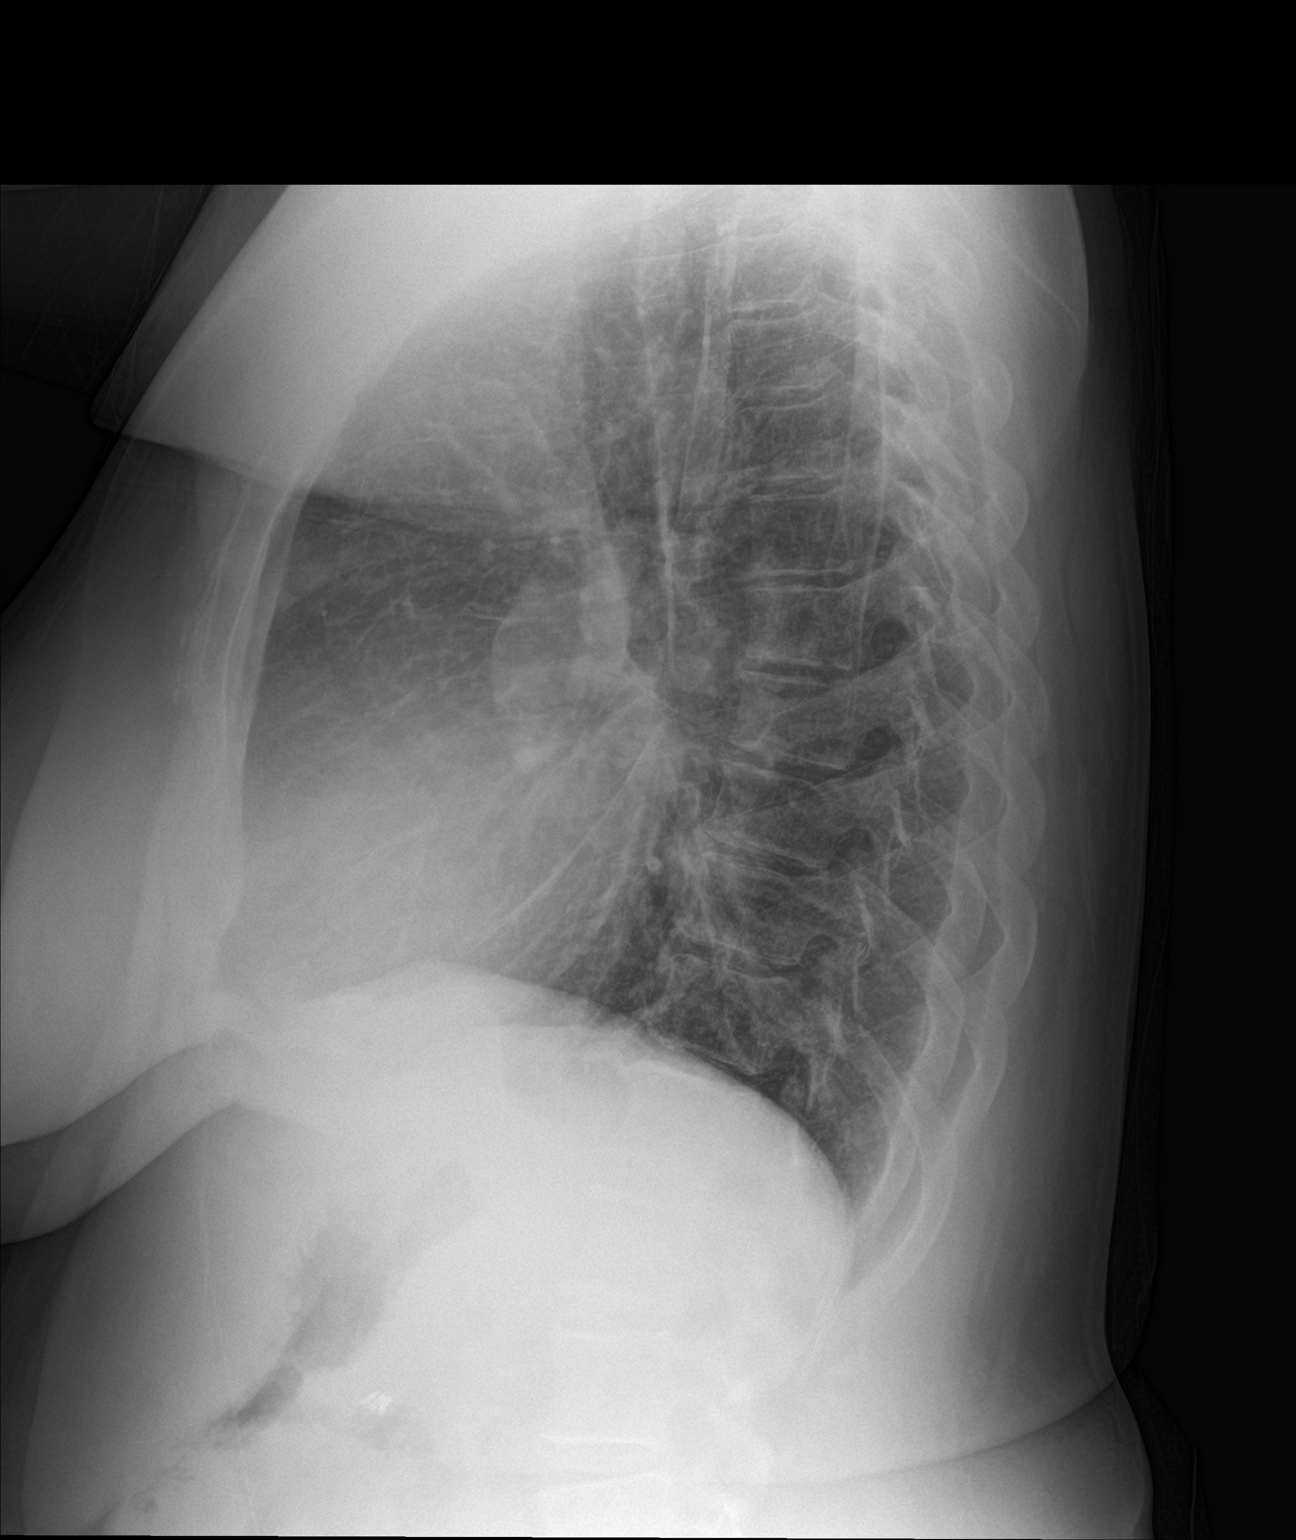

[2 of 2 positions shown; findings below may reference images not displayed]

FINDINGS: Mild cardiomegaly is noted. The lungs are well aerated bilaterally.
No acute bony abnormality is seen.
IMPRESSION: No active cardiopulmonary disease.

## 2015-10-24 MED ORDER — ONDANSETRON HCL 4 MG/2ML IJ SOLN
4.0000 mg | Freq: Once | INTRAMUSCULAR | Status: AC
  Administered 2015-10-24: 4 mg via INTRAVENOUS
  Filled 2015-10-24: qty 2

## 2015-10-24 MED ORDER — ONDANSETRON 4 MG PO TBDP: 4.0000 mg | ORAL_TABLET | Freq: Three times a day (TID) | ORAL | Status: DC | PRN

## 2015-10-24 MED ORDER — KETOROLAC TROMETHAMINE 30 MG/ML IJ SOLN
30.0000 mg | Freq: Once | INTRAMUSCULAR | Status: AC
  Administered 2015-10-24: 30 mg via INTRAVENOUS
  Filled 2015-10-24: qty 1

## 2015-10-24 MED ORDER — SODIUM CHLORIDE 0.9 % IV BOLUS (SEPSIS)
1000.0000 mL | Freq: Once | INTRAVENOUS | Status: AC
  Administered 2015-10-24: 1000 mL via INTRAVENOUS

## 2015-10-24 NOTE — Discharge Instructions (Signed)
As discussed, your evaluation today has been largely reassuring.  But, it is important that you monitor your condition carefully, and do not hesitate to return to the ED if you develop new, or concerning changes in your condition.  Otherwise, please follow-up with your physician for appropriate ongoing care.   Your illness is likely due to a viral process, similar to influenza.   Influenza-like illnesses These are viral infections.  They occurs more often in winter months because people spend more time in close contact with one another. Influenza can make you feel very sick. Influenza easily spreads from [place] to [place] (contagious). CAUSES  Influenza is caused by a virus that infects the respiratory tract. You can catch the virus by breathing in droplets from an infected [place]'s cough or sneeze. You can also catch the virus by touching something that was recently contaminated with the virus and then touching your mouth, nose, or eyes. RISKS AND COMPLICATIONS You may be at risk for a more severe case of influenza if you smoke cigarettes, have diabetes, have chronic heart disease (such as heart failure) or lung disease (such as asthma), or if you have a weakened immune system. Elderly people and pregnant women are also at risk for more serious infections. The most common problem of influenza is a lung infection (pneumonia). Sometimes, this problem can require emergency medical care and may be life threatening. SIGNS AND SYMPTOMS  Symptoms typically last 4 to 10 days and may include:  Fever.  Chills.  Headache, body aches, and muscle aches.  Sore throat.  Chest discomfort and cough.  Poor appetite.  Weakness or feeling tired.  Dizziness.  Nausea or vomiting. DIAGNOSIS  Diagnosis of influenza is often made based on your history and a physical exam. A nose or throat swab test can be done to confirm the diagnosis. TREATMENT  In mild cases, influenza goes away on its own. Treatment  is directed at relieving symptoms. For more severe cases, your health care provider may prescribe antiviral medicines to shorten the sickness. Antibiotic medicines are not effective because the infection is caused by a virus, not by bacteria. HOME CARE INSTRUCTIONS  Take medicines only as directed by your health care provider.  Use a cool mist humidifier to make breathing easier.  Get plenty of rest until your temperature returns to normal. This usually takes 3 to 4 days.  Drink enough fluid to keep your urine clear or pale yellow.  Cover yourmouth and nosewhen coughing or sneezing,and wash your handswellto prevent thevirusfrom spreading.  Stay homefromwork orschool untilthe fever is gonefor at least 68full day. PREVENTION  An annual influenza vaccination (flu shot) is the best way to avoid getting influenza. An annual flu shot is now routinely recommended for all adults in the [place] IF:  You experiencechest pain, yourcough worsens,or you producemore mucus.  Youhave nausea,vomiting, ordiarrhea.  Your fever returns or gets worse. SEEK IMMEDIATE MEDICAL CARE IF:  You havetrouble breathing, you become short of breath,or your skin ornails becomebluish.  You have severe painor stiffnessin the neck.  You develop a sudden headache, or pain in the face or ear.  You have nausea or vomiting that you cannot control. MAKE SURE YOU:   Understand these instructions.  Will watch your condition.  Will get help right away if you are not doing well or get worse.   This information is not intended to replace advice given to you by your health care provider. Make sure you discuss any questions  you have with your health care provider.   Document Released: 07/19/2000 Document Revised: 08/12/2014 Document Reviewed: 10/21/2011 Elsevier Interactive Patient Education [employer].

## 2015-10-24 NOTE — ED Provider Notes (Signed)
History  By signing my name below, I, Marlowe Kays, attest that this documentation has been prepared under the direction and in the presence of Carmin Muskrat, MD. Electronically Signed: Marlowe Kays, ED Scribe. 10/24/2015. 11:31 AM.  Chief Complaint  Patient presents with  . Abdominal Pain  . Diarrhea   The history is provided by the patient and medical records. No language interpreter was used.    HPI Comments:  Robin [name] is a 50 y.o. female who presents to the Emergency Department complaining of abdominal pain that began yesterday morning. She reports associated nausea, diarrhea, CP and generalized body aches. She states she came here yesterday but left because she did not want to wait. She has not taken anything for her symptoms. She denies modifying factors. She denies vomiting, fever, chills. She reports sick contacts stating her grandson was sick recently. She denies alcohol or tobacco use. She does not have a PCP. She reports PMHx of DM, COPD, CHF.   Past Medical History  Diagnosis Date  . Type 2 diabetes mellitus ([place])   . Essential hypertension, benign   . GERD (gastroesophageal reflux disease)   . COPD (chronic obstructive pulmonary disease) ([place])   . Arthritis   . Normal coronary arteries     3/10 - following abnormal Myoview  . Diabetes mellitus   . CHF (congestive heart failure) ([place])   . Chronic abdominal pain   . Ovarian cyst   . Gout   . Gout 2016   Past Surgical History  Procedure Laterality Date  . Scar revision  09/10/2011    Procedure: SCAR REVISION;  Surgeon: Jonnie Kind, MD;  Location: [facility];  Service: Gynecology;  Laterality: N/A;  Wide Excision of old Cicatrix  . Cesarean section  [id number], and 1994  . Abdominal hysterectomy  09/10/2011    Procedure: HYSTERECTOMY ABDOMINAL;  Surgeon: Jonnie Kind, MD;  Location: [facility];  Service: Gynecology;  Laterality: N/A;  Abdominal hysterectomy  . Cholecystectomy  1995  . Tubal ligation  1994    Family History  Problem Relation Age of Onset  . Cirrhosis Mother   . Diabetes type II Father   . Diabetes type II Sister   . Anesthesia problems Neg Hx   . Hypotension Neg Hx   . Malignant hyperthermia Neg Hx   . Pseudochol deficiency Neg Hx    Social History  Substance Use Topics  . Smoking status: Former Smoker -- 0.25 packs/day for 29 years    Types: Cigarettes    Quit date: 08/31/2015  . Smokeless tobacco: Never Used     Comment: 1 cigarette every 3 days  . Alcohol Use: No   OB History    Gravida Para Term Preterm AB TAB SAB Ectopic Multiple Living            3     Review of Systems  Constitutional:       Per HPI, otherwise negative  HENT:       Per HPI, otherwise negative  Respiratory:       Per HPI, otherwise negative  Cardiovascular:       Per HPI, otherwise negative  Gastrointestinal: Negative for vomiting.  Endocrine:       Negative aside from HPI  Genitourinary:       Neg aside from HPI   Musculoskeletal:       Per HPI, otherwise negative  Skin: Negative.   Neurological: Negative for syncope.    Allergies  Bee venom; Naproxen;  and Penicillins  Home Medications   Prior to Admission medications   Medication Sig Start Date End Date Taking? Authorizing Provider  albuterol (PROVENTIL HFA;VENTOLIN HFA) 108 (90 BASE) MCG/ACT inhaler Inhale 1-2 puffs into the lungs every 6 (six) hours as needed for wheezing. 08/25/12   Nat Christen, MD  benzonatate (TESSALON) 200 MG capsule Take 1 capsule (200 mg total) by mouth 3 (three) times daily as needed for cough. Swallow whole, do not chew 10/02/15   Tammy Triplett, PA-C  furosemide (LASIX) 20 MG tablet Take 1 tablet (20 mg total) by mouth 2 (two) times daily. 09/14/15   Lily Kocher, PA-C  HYDROcodone-acetaminophen (NORCO/VICODIN) 5-325 MG tablet Take one-two tabs po q 4-6 hrs prn pain 10/02/15   Tammy Triplett, PA-C  Insulin Glargine (LANTUS SOLOSTAR) 100 UNIT/ML Solostar Pen Inject 20 Units into the skin at bedtime.      Historical Provider, MD  losartan-hydrochlorothiazide (HYZAAR) 100-12.5 MG tablet Take 1 tablet by mouth daily. 06/07/15   Soyla Dryer, PA-C  metoprolol (LOPRESSOR) 100 MG tablet Take 1 tablet (100 mg total) by mouth 2 (two) times daily. Patient taking differently: Take 50 mg by mouth 2 (two) times daily.  08/21/15   Soyla Dryer, PA-C  omeprazole (PRILOSEC) 20 MG capsule Take 1 capsule (20 mg total) by mouth daily. Patient taking differently: Take 40 mg by mouth daily.  08/16/14   Francine Graven, DO  potassium chloride 20 MEQ TBCR Take 40 mEq by mouth daily. 10/02/15   Tammy Triplett, PA-C  Probiotic Product (ALIGN PO) Take by mouth daily.    Historical Provider, MD  sitaGLIPtin-metformin (JANUMET) 50-1000 MG tablet Take 1 tablet by mouth 2 (two) times daily with a meal. 06/07/15   Soyla Dryer, PA-C   Triage Vitals: BP 179/94 mmHg  Pulse 71  Temp(Src) 98.7 F (37.1 C) (Temporal)  Resp 20  Ht 5\' 6"  (1.676 m)  Wt 206 lb (93.441 kg)  BMI 33.27 kg/m2  SpO2 100%  LMP 08/21/2011 Physical Exam  Constitutional: She is oriented to [place], place, [name] No distress.  HENT:  Head: Normocephalic and atraumatic.  Eyes: Conjunctivae and EOM are normal.  Cardiovascular: Normal rate and regular rhythm.   Pulmonary/Chest: Effort normal and breath sounds normal. No stridor. No respiratory distress.  Abdominal: Soft. She exhibits no distension.  non peritoneal   Musculoskeletal: She exhibits no edema.  Neurological: She is alert and oriented to [place], place, and time. No cranial nerve deficit.  Skin: Skin is warm and dry.  Psychiatric: She has a normal mood and affect.  Nursing note and vitals reviewed.   ED Course  Procedures (including critical care time) DIAGNOSTIC STUDIES: Oxygen Saturation is 100% on RA, normal by my interpretation.   COORDINATION OF CARE: 11:05 AM- will order labs and pain medication. Pt verbalizes  understanding and agrees to plan.  Medications - No data to display  Labs Review Labs Reviewed  COMPREHENSIVE METABOLIC PANEL - Abnormal; Notable for the following:    Glucose, Bld 132 (*)    AST 14 (*)    All other components within normal limits  URINALYSIS, ROUTINE W REFLEX MICROSCOPIC (NOT AT [facility]) - Abnormal; Notable for the following:    Hgb urine dipstick SMALL (*)    All other components within normal limits  URINE MICROSCOPIC-ADD ON - Abnormal; Notable for the following:    Squamous Epithelial / LPF 0-5 (*)    Bacteria, UA FEW (*)    All other components  within normal limits  CBG MONITORING, ED - Abnormal; Notable for the following:    Glucose-Capillary 131 (*)    All other components within normal limits  CBC WITH DIFFERENTIAL/PLATELET  LIPASE, BLOOD    Imaging Review Dg Chest 2 View  10/24/2015  CLINICAL DATA:  Shortness of breath and cough EXAM: CHEST  2 VIEW COMPARISON:  09/14/2015 FINDINGS: Mild cardiomegaly is noted. The lungs are well aerated bilaterally. No acute bony abnormality is seen. IMPRESSION: No active cardiopulmonary disease. Electronically Signed   By: Inez Catalina M.D.   On: 10/24/2015 09:38   I have personally reviewed and evaluated these images and lab results as part of my medical decision-making.   EKG Interpretation   Date/Time:  Tuesday October 24 2015 11:21:34 EDT Ventricular Rate:  65 PR Interval:  202 QRS Duration: 107 QT Interval:  453 QTC Calculation: 471 R Axis:   67 Text Interpretation:  Sinus rhythm Borderline prolonged PR interval Left  atrial enlargement Anterior infarct, old Sinus rhythm Non-specific  intra-ventricular conduction delay with 1st degree A-V block Artifact  Abnormal ekg Confirmed by Carmin Muskrat  MD ([id number]) on 10/24/2015  11:33:47 AM     2:27 PM Urinalysis results now available. These results, as well as the rest of the patient's lab results discussed with her. On repeat exam she is in no distress, states  that she feels substantially better. With reassuring findings, patient will be discharged to follow-up with primary care. MDM   I personally performed the services described in this documentation, which was scribed in my presence. The recorded information has been reviewed and is accurate.    Young female presents with concern of ongoing abdominal discomfort, cough, diarrhea, anorexia. Here the patient is awake, alert, afebrile, soft, non-peritoneal abdomen. Patient's labs largely reassuring, with no evidence for bacteremia, sepsis, acute abdomen. Patient's influenza-like illness was improved sufficiently with fluids, antiemetic, pain medicine. Patient discharged in stable condition with primary care follow-up.  Carmin Muskrat, MD 10/24/15 [phone]

## 2015-10-24 NOTE — ED Notes (Signed)
Pt also reports cough, productive at times.

## 2015-10-24 NOTE — ED Notes (Signed)
Pt c/o generalized body aches, abd pain, and diarrhea since yesterday.

## 2015-10-31 ENCOUNTER — Encounter (HOSPITAL_COMMUNITY): Payer: Self-pay | Admitting: Emergency Medicine

## 2015-10-31 ENCOUNTER — Emergency Department (HOSPITAL_COMMUNITY): Payer: Self-pay

## 2015-10-31 ENCOUNTER — Emergency Department (HOSPITAL_COMMUNITY)
Admission: EM | Admit: 2015-10-31 | Discharge: 2015-10-31 | Disposition: A | Discharge status: Home / Self Care | Payer: Self-pay | Attending: Emergency Medicine | Admitting: Emergency Medicine

## 2015-10-31 DIAGNOSIS — Z794 Long term (current) use of insulin: Secondary | ICD-10-CM | POA: Insufficient documentation

## 2015-10-31 DIAGNOSIS — E119 Type 2 diabetes mellitus without complications: Secondary | ICD-10-CM | POA: Insufficient documentation

## 2015-10-31 DIAGNOSIS — Z79899 Other long term (current) drug therapy: Secondary | ICD-10-CM | POA: Insufficient documentation

## 2015-10-31 DIAGNOSIS — I509 Heart failure, unspecified: Secondary | ICD-10-CM | POA: Insufficient documentation

## 2015-10-31 DIAGNOSIS — J449 Chronic obstructive pulmonary disease, unspecified: Secondary | ICD-10-CM | POA: Insufficient documentation

## 2015-10-31 DIAGNOSIS — M79672 Pain in left foot: Secondary | ICD-10-CM | POA: Insufficient documentation

## 2015-10-31 DIAGNOSIS — Z87891 Personal history of nicotine dependence: Secondary | ICD-10-CM | POA: Insufficient documentation

## 2015-10-31 DIAGNOSIS — I110 Hypertensive heart disease with heart failure: Secondary | ICD-10-CM | POA: Insufficient documentation

## 2015-10-31 IMAGING — DX DG FOOT COMPLETE 3+V*L*
3 series · 3 of 3 positions shown · non-contrast
Comparison: None.

CLINICAL DATA: Pain across the dorsum of the foot and toes
beginning yesterday. No known injury. Initial encounter.

EXAM:
LEFT FOOT - COMPLETE 3+ VIEW

[foot ap]
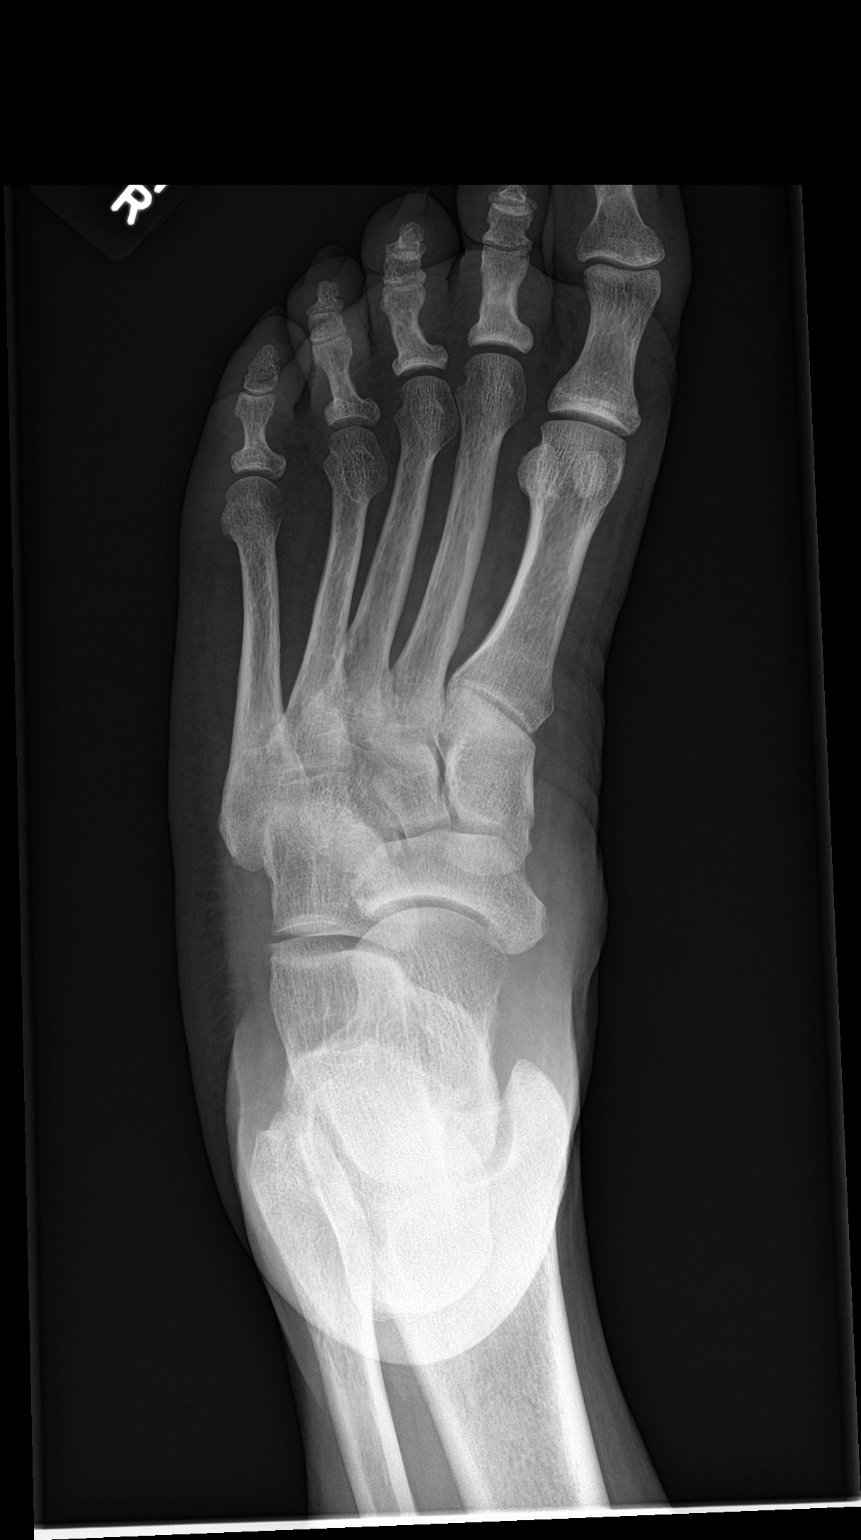

[foot obl]
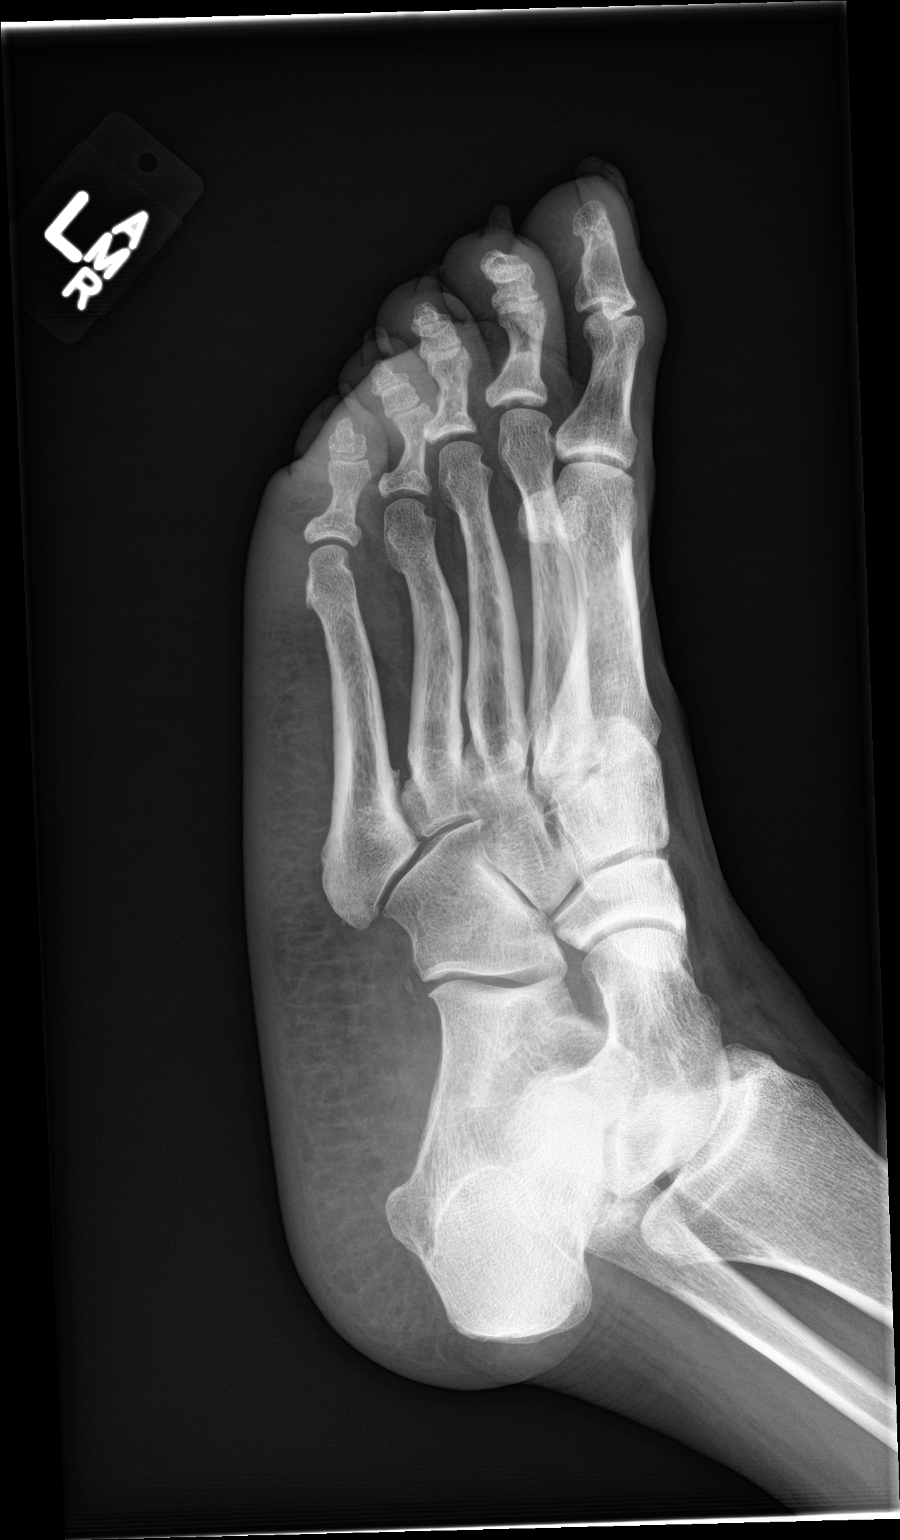

[foot lat]
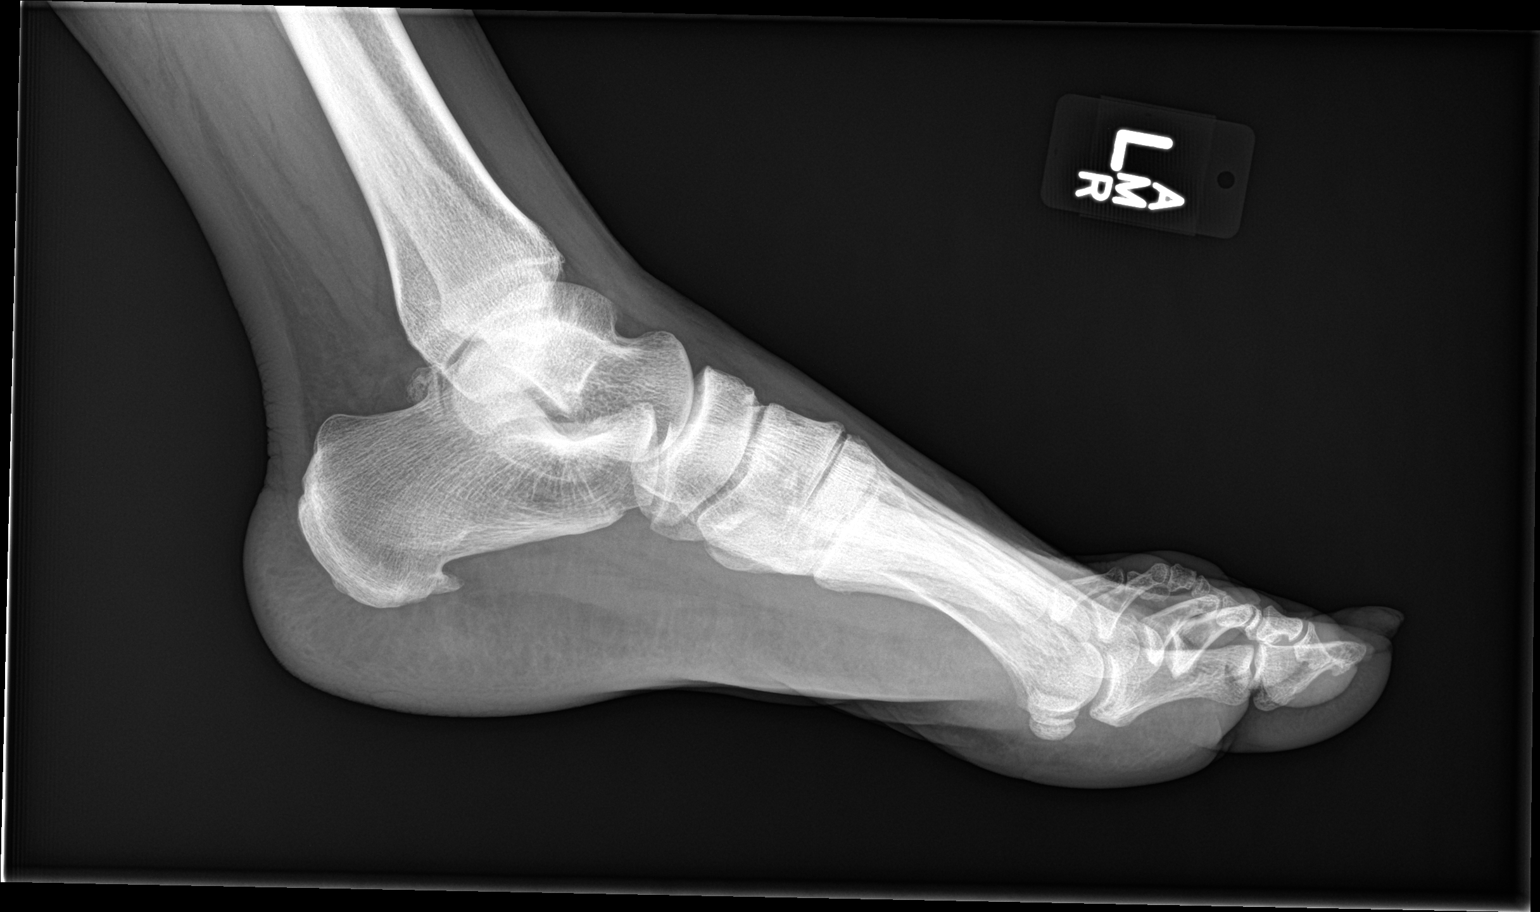

[3 of 3 positions shown; findings below may reference images not displayed]

FINDINGS: There is no evidence of fracture or dislocation. There is no
evidence of arthropathy or other focal bone abnormality. Small
plantar calcaneal spur is noted. Soft tissues are unremarkable.
IMPRESSION: No acute abnormality.

Plantar calcaneal spur.

## 2015-10-31 MED ORDER — OXYCODONE-ACETAMINOPHEN 5-325 MG PO TABS
1.0000 | ORAL_TABLET | Freq: Once | ORAL | Status: AC
  Administered 2015-10-31: 1 via ORAL
  Filled 2015-10-31: qty 1

## 2015-10-31 MED ORDER — PREDNISONE 50 MG PO TABS: ORAL_TABLET | ORAL | Status: DC

## 2015-10-31 MED ORDER — PREDNISONE 50 MG PO TABS
60.0000 mg | ORAL_TABLET | Freq: Once | ORAL | Status: AC
  Administered 2015-10-31: 60 mg via ORAL
  Filled 2015-10-31: qty 1

## 2015-10-31 NOTE — ED Notes (Signed)
Having left foot pain since last night, rates pain 8/10.  History of gout in both feet.

## 2015-10-31 NOTE — ED Notes (Addendum)
Pt requesting work note to be faxed to staffing agency. Pt informed that it is against hospital policy and HIPAA to fax work notes. Pt aware and reported would call staffing agency regarding work note.    Pt helped out to car via wheelchair. family member driving pt home.

## 2015-10-31 NOTE — ED Provider Notes (Signed)
CSN: [id number]     Arrival date & time 10/31/15  0935 History  By signing my name below, I, Stephania Fragmin, attest that this documentation has been prepared under the direction and in the presence of Ripley Fraise, MD. Electronically Signed: Stephania Fragmin, ED Scribe. 10/31/2015. 11:111  AM.    Chief Complaint  Patient presents with  . Foot Pain   Patient is a 50 y.o. female presenting with lower extremity pain. The history is provided by the patient. No language interpreter was used.  Foot Pain This is a recurrent (similar to past episodes of gout) problem. The current episode started yesterday. The problem occurs constantly. The problem has been gradually worsening. Nothing aggravates the symptoms. Nothing relieves the symptoms. She has tried nothing for the symptoms.    HPI Comments: Robin [name] is a 50 y.o. female with a history of gout, arthritis, and NIDDM, who presents to the Emergency Department complaining of atraumatic, constant, worsening left foot pain radiating to the tops of her toes and up her left knee that began yesterday but worsened last night. She denies any trauma or injuries. Patient states this feels similar to previous episodes of gout, although she states she usually has gout exacerbations on her right foot rather than left. Patient states her last gout exacerbation was successfully treated with prednisone. She states her DM is well-controlled by medications. She denies fever, vomiting, tingling, or numbness.  Past Medical History  Diagnosis Date  . Type 2 diabetes mellitus ([place])   . Essential hypertension, benign   . GERD (gastroesophageal reflux disease)   . COPD (chronic obstructive pulmonary disease) ([place])   . Arthritis   . Normal coronary arteries     3/10 - following abnormal Myoview  . Diabetes mellitus   . CHF (congestive heart failure) ([place])   . Chronic abdominal pain   . Ovarian cyst   . Gout   . Gout 2016   Past Surgical History  Procedure Laterality  Date  . Scar revision  09/10/2011    Procedure: SCAR REVISION;  Surgeon: Jonnie Kind, MD;  Location: [facility];  Service: Gynecology;  Laterality: N/A;  Wide Excision of old Cicatrix  . Cesarean section  [id number], and 1994  . Abdominal hysterectomy  09/10/2011    Procedure: HYSTERECTOMY ABDOMINAL;  Surgeon: Jonnie Kind, MD;  Location: [facility];  Service: Gynecology;  Laterality: N/A;  Abdominal hysterectomy  . Cholecystectomy  1995  . Tubal ligation  1994   Family History  Problem Relation Age of Onset  . Cirrhosis Mother   . Diabetes type II Father   . Diabetes type II Sister   . Anesthesia problems Neg Hx   . Hypotension Neg Hx   . Malignant hyperthermia Neg Hx   . Pseudochol deficiency Neg Hx    Social History  Substance Use Topics  . Smoking status: Former Smoker -- 0.25 packs/day for 29 years    Types: Cigarettes    Quit date: 08/31/2015  . Smokeless tobacco: Never Used     Comment: 1 cigarette every 3 days  . Alcohol Use: No   OB History    Gravida Para Term Preterm AB TAB SAB Ectopic Multiple Living            3     Review of Systems  Constitutional: Negative for fever.  Gastrointestinal: Negative for vomiting.  Musculoskeletal: Positive for arthralgias (left foot pain).  Neurological: Negative for numbness.    Allergies  Bee venom; Naproxen;  and Penicillins  Home Medications   Prior to Admission medications   Medication Sig Start Date End Date Taking? Authorizing Provider  albuterol (PROVENTIL HFA;VENTOLIN HFA) 108 (90 BASE) MCG/ACT inhaler Inhale 1-2 puffs into the lungs every 6 (six) hours as needed for wheezing. 08/25/12   Nat Christen, MD  benzonatate (TESSALON) 200 MG capsule Take 1 capsule (200 mg total) by mouth 3 (three) times daily as needed for cough. Swallow whole, do not chew Patient not taking: Reported on 10/24/2015 10/02/15   Tammy Triplett, PA-C  furosemide (LASIX) 20 MG tablet Take 1 tablet (20 mg total) by mouth 2 (two) times daily. Patient not  taking: Reported on 10/24/2015 09/14/15   Lily Kocher, PA-C  HYDROcodone-acetaminophen (NORCO/VICODIN) 5-325 MG tablet Take one-two tabs po q 4-6 hrs prn pain Patient not taking: Reported on 10/24/2015 10/02/15   Tammy Triplett, PA-C  Insulin Glargine (LANTUS SOLOSTAR) 100 UNIT/ML Solostar Pen Inject 20 Units into the skin at bedtime.     Historical Provider, MD  losartan-hydrochlorothiazide (HYZAAR) 100-12.5 MG tablet Take 1 tablet by mouth daily. 06/07/15   Soyla Dryer, PA-C  metoprolol (LOPRESSOR) 100 MG tablet Take 1 tablet (100 mg total) by mouth 2 (two) times daily. Patient taking differently: Take 50 mg by mouth 2 (two) times daily.  08/21/15   Soyla Dryer, PA-C  omeprazole (PRILOSEC) 20 MG capsule Take 1 capsule (20 mg total) by mouth daily. Patient taking differently: Take 40 mg by mouth daily.  08/16/14   Francine Graven, DO  ondansetron (ZOFRAN ODT) 4 MG disintegrating tablet Take 1 tablet (4 mg total) by mouth every 8 (eight) hours as needed for nausea or vomiting. 10/24/15   Carmin Muskrat, MD  potassium chloride 20 MEQ TBCR Take 40 mEq by mouth daily. Patient not taking: Reported on 10/24/2015 10/02/15   Tammy Triplett, PA-C  Probiotic Product (ALIGN PO) Take by mouth daily.    Historical Provider, MD  sitaGLIPtin-metformin (JANUMET) 50-1000 MG tablet Take 1 tablet by mouth 2 (two) times daily with a meal. 06/07/15   Soyla Dryer, PA-C   BP 175/93 mmHg  Pulse 74  Temp(Src) 98.2 F (36.8 C) (Oral)  Resp 17  Ht 5\' 6"  (1.676 m)  Wt 202 lb (91.627 kg)  BMI 32.62 kg/m2  SpO2 100%  LMP 08/21/2011 Physical Exam  Nursing note and vitals reviewed. CONSTITUTIONAL: Well developed/well nourished HEAD: Normocephalic/atraumatic ENMT: Mucous membranes moist NECK: supple no meningeal signs CV: S1/S2 noted, no murmurs/rubs/gallops noted LUNGS: Lungs are clear to auscultation bilaterally, no apparent distress ABDOMEN: soft, nontender, no rebound or guarding, bowel sounds noted  throughout abdomen NEURO: Pt is awake/alert/appropriate, moves all extremitiesx4.  No facial droop.   EXTREMITIES: pulses normal/equal, tenderness noted to left foot, no erythema, no edema, no signs of trauma, no puncture wounds, limitation of ROM due to pain, no left calf tenderness or edema SKIN: warm, color normal PSYCH: no abnormalities of mood noted, alert and oriented to situation   ED Course  Procedures  Medications  predniSONE (DELTASONE) tablet 60 mg (60 mg Oral Given 10/31/15 1117)  oxyCODONE-acetaminophen (PERCOCET/ROXICET) 5-325 MG per tablet 1 tablet (1 tablet Oral Given 10/31/15 1117)    DIAGNOSTIC STUDIES: Oxygen Saturation is 100% on RA, normal by my interpretation.    COORDINATION OF CARE: 11:08 AM - Pt made aware of XR results. Discussed treatment plan with pt at bedside which includes 5-day course of prednisone. Advised pt to monitor blood sugars more closely at home while undergoing treatment. Will administer dosage of  prednisone and pain medication here. Advised limited walking and elevate foot at home. Return precautions discussed. Pt verbalized understanding and agreed to plan.   Imaging Review Dg Foot Complete Left  10/31/2015  CLINICAL DATA:  Pain across the dorsum of the foot and toes beginning yesterday. No known injury. Initial encounter. EXAM: LEFT FOOT - COMPLETE 3+ VIEW COMPARISON:  None. FINDINGS: There is no evidence of fracture or dislocation. There is no evidence of arthropathy or other focal bone abnormality. Small plantar calcaneal spur is noted. Soft tissues are unremarkable. IMPRESSION: No acute abnormality. Plantar calcaneal spur. Electronically Signed   By: Inge Rise M.D.   On: 10/31/2015 10:22   I have personally reviewed and evaluated these images and lab results as part of my medical decision-making.   Pt well appearing No signs of cellulitis No signs of septic joint No signs of trauma No crepitus She insists this is like previous gout  and reports responds to prednisone Advised to monitor glucose closely while using prednisone  MDM   Final diagnoses:  Pain in left foot    Nursing notes including past medical history and social history reviewed and considered in documentation  I personally performed the services described in this documentation, which was scribed in my presence. The recorded information has been reviewed and is accurate.         Ripley Fraise, MD 10/31/15 1530

## 2015-12-20 ENCOUNTER — Encounter (HOSPITAL_COMMUNITY): Payer: Self-pay | Admitting: *Deleted

## 2015-12-20 ENCOUNTER — Emergency Department (HOSPITAL_COMMUNITY)
Admission: EM | Admit: 2015-12-20 | Discharge: 2015-12-20 | Disposition: A | Discharge status: Home / Self Care | Payer: Self-pay | Attending: Emergency Medicine | Admitting: Emergency Medicine

## 2015-12-20 DIAGNOSIS — G5602 Carpal tunnel syndrome, left upper limb: Secondary | ICD-10-CM | POA: Insufficient documentation

## 2015-12-20 DIAGNOSIS — Z87891 Personal history of nicotine dependence: Secondary | ICD-10-CM | POA: Insufficient documentation

## 2015-12-20 DIAGNOSIS — I110 Hypertensive heart disease with heart failure: Secondary | ICD-10-CM | POA: Insufficient documentation

## 2015-12-20 DIAGNOSIS — Z794 Long term (current) use of insulin: Secondary | ICD-10-CM | POA: Insufficient documentation

## 2015-12-20 DIAGNOSIS — E119 Type 2 diabetes mellitus without complications: Secondary | ICD-10-CM | POA: Insufficient documentation

## 2015-12-20 DIAGNOSIS — I509 Heart failure, unspecified: Secondary | ICD-10-CM | POA: Insufficient documentation

## 2015-12-20 DIAGNOSIS — J449 Chronic obstructive pulmonary disease, unspecified: Secondary | ICD-10-CM | POA: Insufficient documentation

## 2015-12-20 DIAGNOSIS — M1990 Unspecified osteoarthritis, unspecified site: Secondary | ICD-10-CM | POA: Insufficient documentation

## 2015-12-20 DIAGNOSIS — Z791 Long term (current) use of non-steroidal anti-inflammatories (NSAID): Secondary | ICD-10-CM | POA: Insufficient documentation

## 2015-12-20 MED ORDER — HYDROCODONE-ACETAMINOPHEN 5-325 MG PO TABS
1.0000 | ORAL_TABLET | Freq: Once | ORAL | Status: AC
  Administered 2015-12-20: 1 via ORAL
  Filled 2015-12-20: qty 1

## 2015-12-20 MED ORDER — KETOROLAC TROMETHAMINE 30 MG/ML IJ SOLN
30.0000 mg | Freq: Once | INTRAMUSCULAR | Status: AC
  Administered 2015-12-20: 30 mg via INTRAMUSCULAR
  Filled 2015-12-20: qty 1

## 2015-12-20 MED ORDER — HYDROCODONE-ACETAMINOPHEN 5-325 MG PO TABS: 1.0000 | ORAL_TABLET | Freq: Four times a day (QID) | ORAL | Status: DC | PRN

## 2015-12-20 MED ORDER — IBUPROFEN 600 MG PO TABS: 600.0000 mg | ORAL_TABLET | Freq: Four times a day (QID) | ORAL | Status: DC | PRN

## 2015-12-20 NOTE — Discharge Instructions (Signed)
Carpal Tunnel Syndrome Carpal tunnel syndrome is a condition that causes pain in your hand and arm. The carpal tunnel is a narrow area located on the palm side of your wrist. Repeated wrist motion or certain diseases may cause swelling within the tunnel. This swelling pinches the main nerve in the wrist (median nerve). CAUSES  This condition may be caused by:   Repeated wrist motions.  Wrist injuries.  Arthritis.  A cyst or tumor in the carpal tunnel.  Fluid buildup during pregnancy. Sometimes the cause of this condition is not known.  RISK FACTORS This condition is more likely to develop in:   People who have jobs that cause them to repeatedly move their wrists in the same motion, such as butchers and cashiers.  Women.  People with certain conditions, such as:  Diabetes.  Obesity.  An underactive thyroid (hypothyroidism).  Kidney failure. SYMPTOMS  Symptoms of this condition include:   A tingling feeling in your fingers, especially in your thumb, index, and middle fingers.  Tingling or numbness in your hand.  An aching feeling in your entire arm, especially when your wrist and elbow are bent for long periods of time.  Wrist pain that goes up your arm to your shoulder.  Pain that goes down into your palm or fingers.  A weak feeling in your hands. You may have trouble grabbing and holding items. Your symptoms may feel worse during the night.  DIAGNOSIS  This condition is diagnosed with a medical history and physical exam. You may also have tests, including:   An electromyogram (EMG). This test measures electrical signals sent by your nerves into the muscles.  X-rays. TREATMENT  Treatment for this condition includes:  Lifestyle changes. It is important to stop doing or modify the activity that caused your condition.  Physical or occupational therapy.  Medicines for pain and inflammation. This may include medicine that is injected into your wrist.  A wrist  splint.  Surgery. HOME CARE INSTRUCTIONS  If You Have a Splint:  Wear it as told by your health care provider. Remove it only as told by your health care provider.  Loosen the splint if your fingers become numb and tingle, or if they turn cold and blue.  Keep the splint clean and dry. General Instructions  Take over-the-counter and prescription medicines only as told by your health care provider.  Rest your wrist from any activity that may be causing your pain. If your condition is work related, talk to your employer about changes that can be made, such as getting a wrist pad to use while typing.  If directed, apply ice to the painful area:  Put ice in a plastic bag.  Place a towel between your skin and the bag.  Leave the ice on for 20 minutes, 2-3 times per day.  Keep all follow-up visits as told by your health care provider. This is important.  Do any exercises as told by your health care provider, physical therapist, or occupational therapist. SEEK MEDICAL CARE IF:   You have new symptoms.  Your pain is not controlled with medicines.  Your symptoms get worse.   This information is not intended to replace advice given to you by your health care provider. Make sure you discuss any questions you have with your health care provider.   Document Released: 07/19/2000 Document Revised: 04/12/2015 Document Reviewed: 12/07/2014 Elsevier Interactive Patient Education 2016 Elsevier Inc.  

## 2015-12-20 NOTE — ED Notes (Signed)
Pt c/o left arm pain; pt states she has an old injury to the arm, but states the pain kept her awake all night

## 2015-12-20 NOTE — ED Provider Notes (Signed)
CSN: [id number]     Arrival date & time 12/20/15  [id number] History   First MD Initiated Contact with Patient 12/20/15 0340     Chief Complaint  Patient presents with  . Arm Pain     (Consider location/radiation/quality/duration/timing/severity/associated sxs/prior Treatment) HPI  This a 50 year old female with a history of diabetes, CHF who presents with left arm pain. Patient reports history of carpal tunnel syndrome in the left hand and arm. She reports 2 to three-day history of worsening pain. She states that it is throbbing. It is 10 out of 10. She states that she is "taken something from [employer] that is not working." She states that the pain keeps her up at night. She denies any weakness of the hand. She does report tingling. Denies new injury. She does report remote left elbow injury. Patient does not wear a splint.  Past Medical History  Diagnosis Date  . Type 2 diabetes mellitus ([place])   . Essential hypertension, benign   . GERD (gastroesophageal reflux disease)   . COPD (chronic obstructive pulmonary disease) ([place])   . Arthritis   . Normal coronary arteries     3/10 - following abnormal Myoview  . Diabetes mellitus   . CHF (congestive heart failure) ([place])   . Chronic abdominal pain   . Ovarian cyst   . Gout   . Gout 2016   Past Surgical History  Procedure Laterality Date  . Scar revision  09/10/2011    Procedure: SCAR REVISION;  Surgeon: Jonnie Kind, MD;  Location: [facility];  Service: Gynecology;  Laterality: N/A;  Wide Excision of old Cicatrix  . Cesarean section  [id number], and 1994  . Abdominal hysterectomy  09/10/2011    Procedure: HYSTERECTOMY ABDOMINAL;  Surgeon: Jonnie Kind, MD;  Location: [facility];  Service: Gynecology;  Laterality: N/A;  Abdominal hysterectomy  . Cholecystectomy  1995  . Tubal ligation  1994   Family History  Problem Relation Age of Onset  . Cirrhosis Mother   . Diabetes type II Father   . Diabetes type II Sister   . Anesthesia  problems Neg Hx   . Hypotension Neg Hx   . Malignant hyperthermia Neg Hx   . Pseudochol deficiency Neg Hx    Social History  Substance Use Topics  . Smoking status: Former Smoker -- 0.25 packs/day for 29 years    Types: Cigarettes    Quit date: 08/31/2015  . Smokeless tobacco: Never Used     Comment: 1 cigarette every 3 days  . Alcohol Use: No   OB History    Gravida Para Term Preterm AB TAB SAB Ectopic Multiple Living            3     Review of Systems  Musculoskeletal:       Left arm and hand pain  Neurological: Positive for numbness. Negative for weakness.  All other systems reviewed and are negative.     Allergies  Bee venom; Naproxen; and Penicillins  Home Medications   Prior to Admission medications   Medication Sig Start Date End Date Taking? Authorizing Provider  albuterol (PROVENTIL HFA;VENTOLIN HFA) 108 (90 BASE) MCG/ACT inhaler Inhale 1-2 puffs into the lungs every 6 (six) hours as needed for wheezing. 08/25/12   Nat Christen, MD  furosemide (LASIX) 20 MG tablet Take 1 tablet (20 mg total) by mouth 2 (two) times daily. Patient not taking: Reported on 10/24/2015 09/14/15   Lily Kocher, PA-C  HYDROcodone-acetaminophen (NORCO/VICODIN) 5-325 MG  tablet Take 1 tablet by mouth every 6 (six) hours as needed for moderate pain. 12/20/15   Merryl Hacker, MD  ibuprofen (ADVIL,MOTRIN) 600 MG tablet Take 1 tablet (600 mg total) by mouth every 6 (six) hours as needed. 12/20/15   Merryl Hacker, MD  Insulin Glargine (LANTUS SOLOSTAR) 100 UNIT/ML Solostar Pen Inject 20 Units into the skin at bedtime.     Historical Provider, MD  losartan-hydrochlorothiazide (HYZAAR) 100-12.5 MG tablet Take 1 tablet by mouth daily. 06/07/15   Soyla Dryer, PA-C  metoprolol (LOPRESSOR) 100 MG tablet Take 1 tablet (100 mg total) by mouth 2 (two) times daily. Patient taking differently: Take 50 mg by mouth 2 (two) times daily.  08/21/15   Soyla Dryer, PA-C  omeprazole (PRILOSEC) 20 MG capsule  Take 1 capsule (20 mg total) by mouth daily. Patient taking differently: Take 40 mg by mouth daily.  08/16/14   Francine [place], DO  ondansetron (ZOFRAN ODT) 4 MG disintegrating tablet Take 1 tablet (4 mg total) by mouth every 8 (eight) hours as needed for nausea or vomiting. 10/24/15   Carmin Muskrat, MD  potassium chloride 20 MEQ TBCR Take 40 mEq by mouth daily. Patient not taking: Reported on 10/24/2015 10/02/15   Tammy Triplett, PA-C  predniSONE (DELTASONE) 50 MG tablet One tablet PO daily for 4 days Please check your sugar frequently while taking this medication 10/31/15   Ripley Fraise, MD  Probiotic Product (ALIGN PO) Take by mouth daily.    Historical Provider, MD  sitaGLIPtin-metformin (JANUMET) 50-1000 MG tablet Take 1 tablet by mouth 2 (two) times daily with a meal. 06/07/15   Soyla Dryer, PA-C   BP 201/107 mmHg  Pulse 73  Temp(Src) 97.7 F (36.5 C) (Oral)  Resp 16  Ht 5\' 7"  (1.702 m)  Wt 215 lb (97.523 kg)  BMI 33.67 kg/m2  SpO2 98%  LMP 08/21/2011 Physical Exam  Constitutional: She is oriented to [place], place, [name]  Tearful  HENT:  Head: Normocephalic and atraumatic.  Cardiovascular: Normal rate, regular rhythm and normal heart sounds.   Pulmonary/Chest: Effort normal and breath sounds normal. No respiratory distress. She has no wheezes.  Musculoskeletal:  No obvious deformities of the left arm or hand, tenderness with palpation of the median nerve over the left wrist, no overlying skin changes, normal range of motion of the wrist  Neurological: She is alert and oriented to [place], place, and time.  Neurovascularly intact  Skin: Skin is warm and dry.  Psychiatric: She has a normal mood and affect.  Nursing note and vitals reviewed.   ED Course  Procedures (including critical care time) Labs Review Labs Reviewed - No data to display  Imaging Review No results found. I have personally reviewed and evaluated  these images and lab results as part of my medical decision-making.   EKG Interpretation None      MDM   Final diagnoses:  Carpal tunnel syndrome of left wrist    Patient presents with pain in the left hand and arm. Pain is consistent with carpal tunnel syndrome. Symptoms are worse at night. Unclear what medication she is taking at home. She was given Norco and provided a splint. Will discharge with a short course of pain medication and anti-inflammatories as the mainstay of treatment.  After history, exam, and medical workup I feel the patient has been appropriately medically screened and is safe for discharge home. Pertinent diagnoses were discussed with the patient. Patient was given  return precautions.    Merryl Hacker, MD 12/20/15 [phone]

## 2016-01-02 ENCOUNTER — Encounter (HOSPITAL_COMMUNITY): Payer: Self-pay | Admitting: Emergency Medicine

## 2016-01-02 ENCOUNTER — Emergency Department (HOSPITAL_COMMUNITY)
Admission: EM | Admit: 2016-01-02 | Discharge: 2016-01-02 | Disposition: A | Discharge status: Home / Self Care | Payer: Self-pay | Attending: Emergency Medicine | Admitting: Emergency Medicine

## 2016-01-02 ENCOUNTER — Emergency Department (HOSPITAL_COMMUNITY): Payer: Self-pay

## 2016-01-02 DIAGNOSIS — Z794 Long term (current) use of insulin: Secondary | ICD-10-CM | POA: Insufficient documentation

## 2016-01-02 DIAGNOSIS — J449 Chronic obstructive pulmonary disease, unspecified: Secondary | ICD-10-CM | POA: Insufficient documentation

## 2016-01-02 DIAGNOSIS — M1711 Unilateral primary osteoarthritis, right knee: Secondary | ICD-10-CM | POA: Insufficient documentation

## 2016-01-02 DIAGNOSIS — I509 Heart failure, unspecified: Secondary | ICD-10-CM | POA: Insufficient documentation

## 2016-01-02 DIAGNOSIS — I110 Hypertensive heart disease with heart failure: Secondary | ICD-10-CM | POA: Insufficient documentation

## 2016-01-02 DIAGNOSIS — Z87891 Personal history of nicotine dependence: Secondary | ICD-10-CM | POA: Insufficient documentation

## 2016-01-02 DIAGNOSIS — Z79899 Other long term (current) drug therapy: Secondary | ICD-10-CM | POA: Insufficient documentation

## 2016-01-02 DIAGNOSIS — M1990 Unspecified osteoarthritis, unspecified site: Secondary | ICD-10-CM | POA: Insufficient documentation

## 2016-01-02 DIAGNOSIS — E119 Type 2 diabetes mellitus without complications: Secondary | ICD-10-CM | POA: Insufficient documentation

## 2016-01-02 LAB — BASIC METABOLIC PANEL
Anion gap: 7 (ref 5–15)
BUN: 15 mg/dL (ref 6–20)
CO2: 24 mmol/L (ref 22–32)
Calcium: 8.9 mg/dL (ref 8.9–10.3)
Chloride: 106 mmol/L (ref 101–111)
Creatinine, Ser: 0.91 mg/dL (ref 0.44–1.00)
GFR calc Af Amer: >60 mL/min (ref >60)
GFR calc non Af Amer: >60 mL/min (ref >60)
Glucose, Bld: 218 mg/dL — ABNORMAL HIGH (ref 65–99)
Potassium: 3.6 mmol/L (ref 3.5–5.1)
Sodium: 137 mmol/L (ref 135–145)

## 2016-01-02 LAB — BRAIN NATRIURETIC PEPTIDE: B Natriuretic Peptide: 273 pg/mL — ABNORMAL HIGH (ref 0.0–100.0)

## 2016-01-02 LAB — CBC
HCT: 40.6 % (ref 36.0–46.0)
Hemoglobin: 13.5 g/dL (ref 12.0–15.0)
MCH: 29.3 pg (ref 26.0–34.0)
MCHC: 33.3 g/dL (ref 30.0–36.0)
MCV: 88.3 fL (ref 78.0–100.0)
Platelets: 196 10*3/uL (ref 150–400)
RBC: 4.6 MIL/uL (ref 3.87–5.11)
RDW: 13.6 % (ref 11.5–15.5)
WBC: 7.1 10*3/uL (ref 4.0–10.5)

## 2016-01-02 LAB — TROPONIN I
Troponin I: <0.03 ng/mL (ref <0.031)
Troponin I: <0.03 ng/mL (ref <0.031)

## 2016-01-02 LAB — D-DIMER, QUANTITATIVE (NOT AT ARMC): D-Dimer, Quant: 0.32 ug/mL-FEU (ref 0.00–0.50)

## 2016-01-02 IMAGING — DX DG CHEST 2V
2 series · 2 of 2 positions shown · non-contrast
Comparison: [DATE]

CLINICAL DATA: Short of breath

EXAM:
CHEST  2 VIEW

[chest pa]
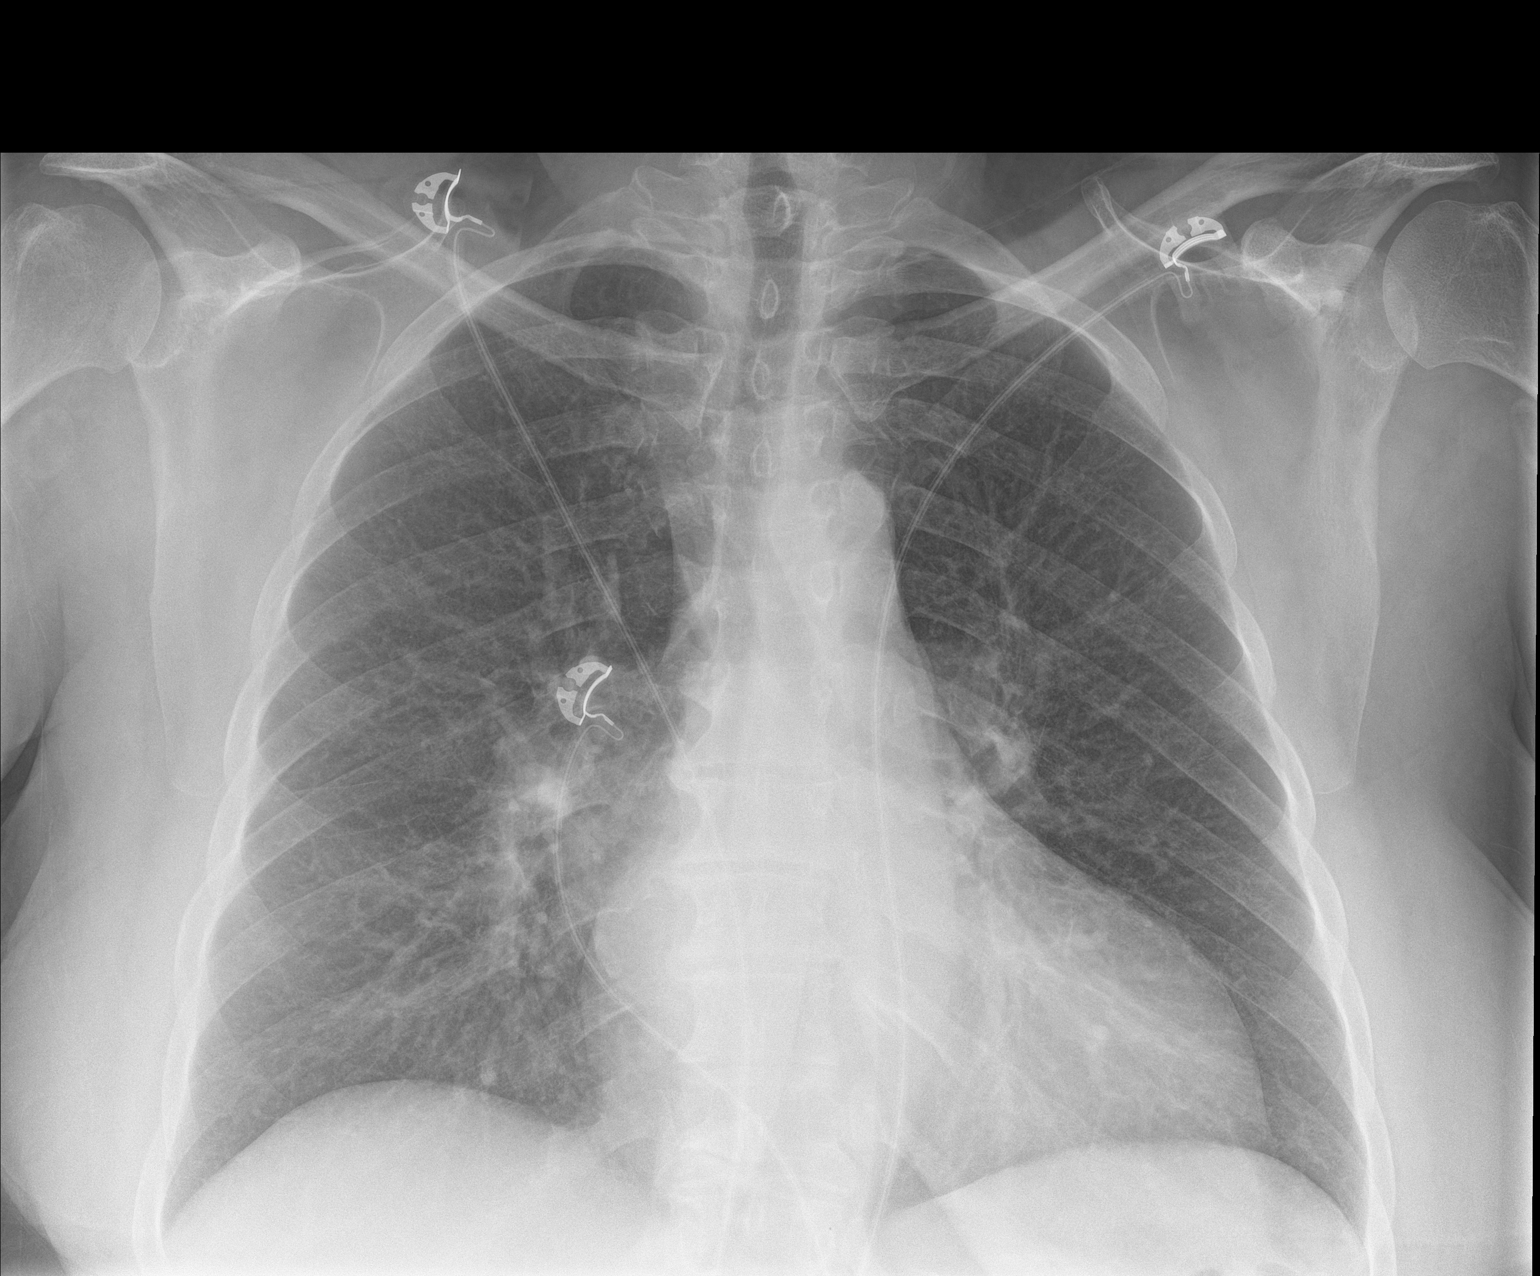

[chest lat]
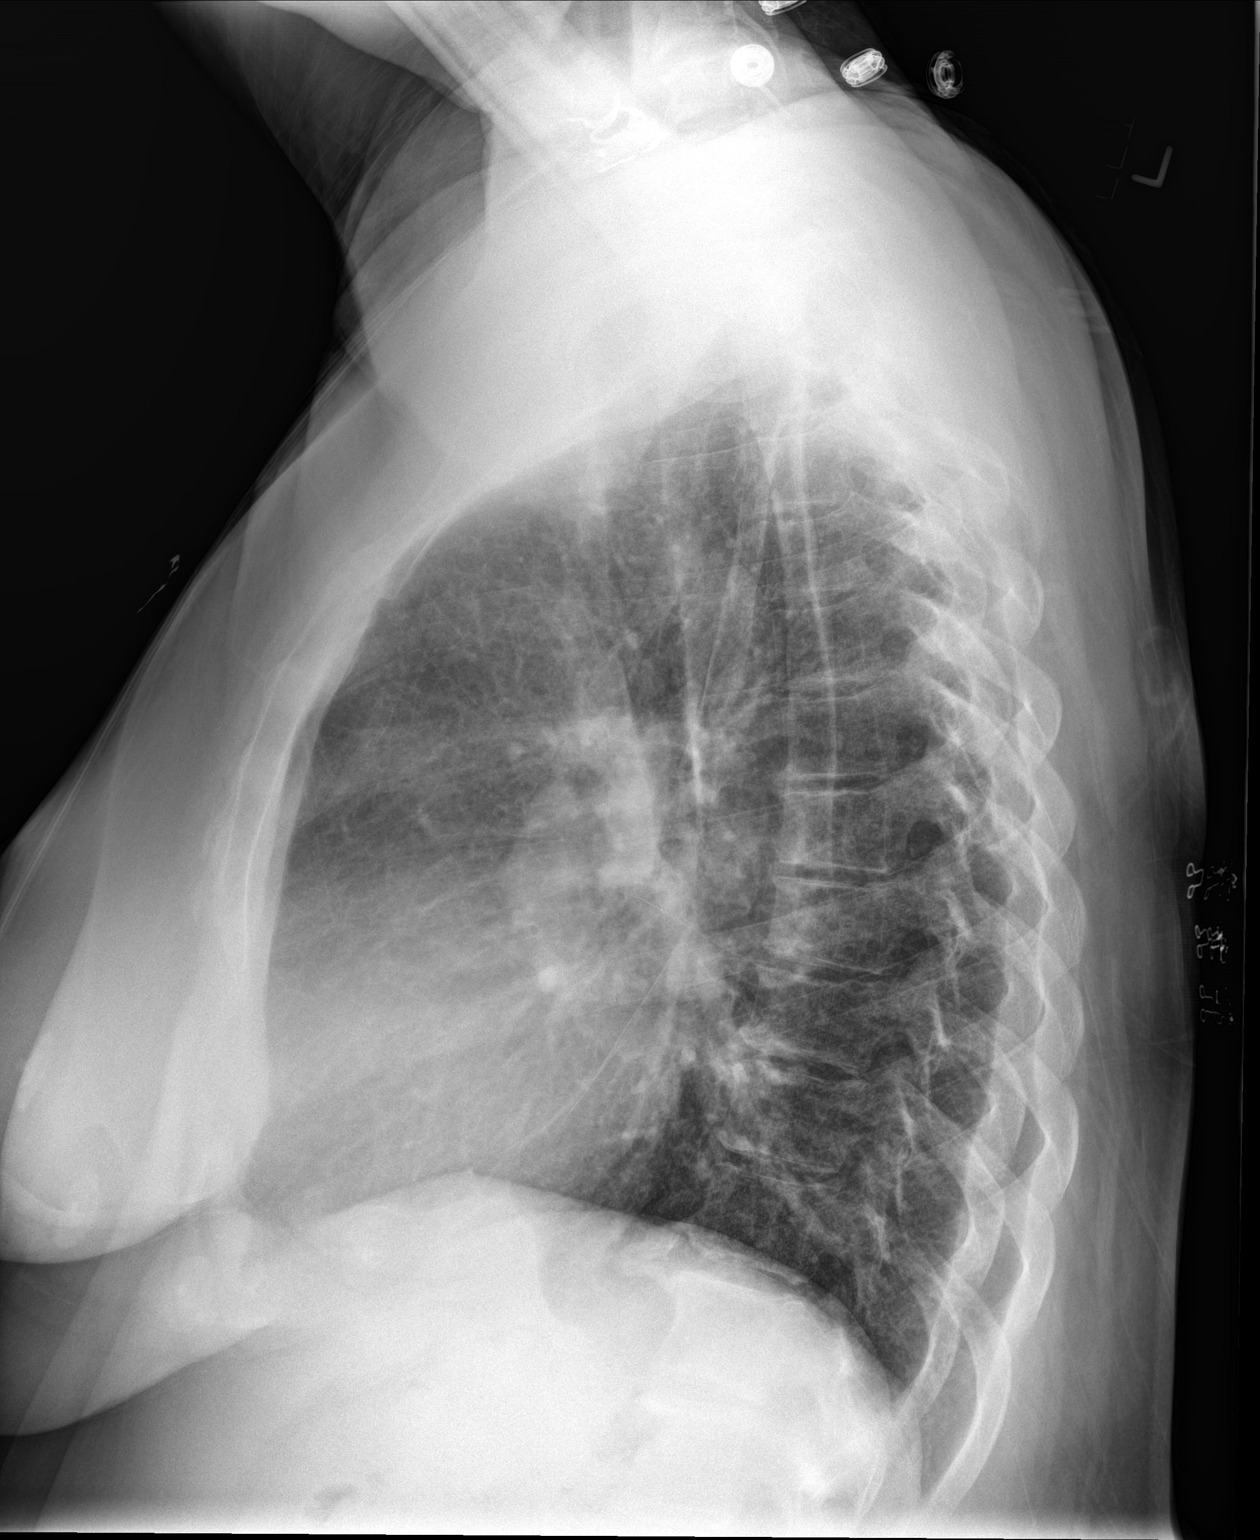

[2 of 2 positions shown; findings below may reference images not displayed]

FINDINGS: Cardiac enlargement without heart failure. Heart size is unchanged
from the prior study.

Lungs remain clear without infiltrate or effusion. Negative for mass
or adenopathy.
IMPRESSION: No active cardiopulmonary disease.

## 2016-01-02 MED ORDER — HYDROCODONE-ACETAMINOPHEN 5-325 MG PO TABS
2.0000 | ORAL_TABLET | Freq: Once | ORAL | Status: AC
  Administered 2016-01-02: 2 via ORAL
  Filled 2016-01-02: qty 2

## 2016-01-02 MED ORDER — HYDROCODONE-ACETAMINOPHEN 5-325 MG PO TABS: 1.0000 | ORAL_TABLET | ORAL | Status: DC | PRN

## 2016-01-02 NOTE — Discharge Instructions (Signed)
Your lab test and x-ray are negative for acute cardiac or pulmonary problems. Your examination shows arthritis changes involving the right knee. Use arthritis rubs to the knee. May use Norco for more severe pain. This medication may cause drowsiness. Please do not drink, drive, or participate in activity that requires concentration while taking this medication. Monitor your breathing closely. It would be helpful to weigh yourself daily to monitor for any retention of fluids. Please see the physicians at the [facility] free clinic for additional assistance. Osteoarthritis Osteoarthritis is a disease that causes soreness and inflammation of a joint. It occurs when the cartilage at the affected joint wears down. Cartilage acts as a cushion, covering the ends of bones where they meet to form a joint. Osteoarthritis is the most common form of arthritis. It often occurs in older people. The joints affected most often by this condition include those in the:  Ends of the fingers.  Thumbs.  Neck.  Lower back.  Knees.  Hips. CAUSES  Over time, the cartilage that covers the ends of bones begins to wear away. This causes bone to rub on bone, producing pain and stiffness in the affected joints.  RISK FACTORS Certain factors can increase your chances of having osteoarthritis, including:  Older age.  Excessive body weight.  Overuse of joints.  Previous joint injury. SIGNS AND SYMPTOMS   Pain, swelling, and stiffness in the joint.  Over time, the joint may lose its normal shape.  Small deposits of bone (osteophytes) may grow on the edges of the joint.  Bits of bone or cartilage can break off and float inside the joint space. This may cause more pain and damage. DIAGNOSIS  Your health care provider will do a physical exam and ask about your symptoms. Various tests may be ordered, such as:  X-rays of the affected joint.  Blood tests to rule out other types of arthritis. Additional tests may  be used to diagnose your condition. TREATMENT  Goals of treatment are to control pain and improve joint function. Treatment plans may include:  A prescribed exercise program that allows for rest and joint relief.  A weight control plan.  Pain relief techniques, such as:  Properly applied heat and cold.  Electric pulses delivered to nerve endings under the skin (transcutaneous electrical nerve stimulation [TENS]).  Massage.  Certain nutritional supplements.  Medicines to control pain, such as:  Acetaminophen.  Nonsteroidal anti-inflammatory drugs (NSAIDs), such as naproxen.  Narcotic or central-acting agents, such as tramadol.  Corticosteroids. These can be given orally or as an injection.  Surgery to reposition the bones and relieve pain (osteotomy) or to remove loose pieces of bone and cartilage. Joint replacement may be needed in advanced states of osteoarthritis. HOME CARE INSTRUCTIONS   Take medicines only as directed by your health care provider.  Maintain a healthy weight. Follow your health care provider's instructions for weight control. This may include dietary instructions.  Exercise as directed. Your health care provider can recommend specific types of exercise. These may include:  Strengthening exercises. These are done to strengthen the muscles that support joints affected by arthritis. They can be performed with weights or with exercise bands to add resistance.  Aerobic activities. These are exercises, such as brisk walking or low-impact aerobics, that get your heart pumping.  Range-of-motion activities. These keep your joints limber.  Balance and agility exercises. These help you maintain daily living skills.  Rest your affected joints as directed by your health care provider.  Keep  all follow-up visits as directed by your health care provider. SEEK MEDICAL CARE IF:   Your skin turns red.  You develop a rash in addition to your joint pain.  You  have worsening joint pain.  You have a fever along with joint or muscle aches. SEEK IMMEDIATE MEDICAL CARE IF:  You have a significant loss of weight or appetite.  You have night sweats. [place] of Arthritis and Musculoskeletal and Skin Diseases: www.niams.SouthExposed.es  [employer] on Aging: http://kim-miller.com/  American College of Rheumatology: www.rheumatology.org   This information is not intended to replace advice given to you by your health care provider. Make sure you discuss any questions you have with your health care provider.   Document Released: 07/22/2005 Document Revised: 08/12/2014 Document Reviewed: 03/29/2013 Elsevier Interactive Patient Education 2016 Elsevier Inc.  Chronic Obstructive Pulmonary Disease Exacerbation Chronic obstructive pulmonary disease (COPD) is a common lung problem. In COPD, the flow of air from the lungs is limited. COPD exacerbations are times that breathing gets worse and you need extra treatment. Without treatment they can be life threatening. If they happen often, your lungs can become more damaged. If your COPD gets worse, your doctor may treat you with:  Medicines.  Oxygen.  Different ways to clear your airway, such as using a mask. HOME CARE  Do not smoke.  Avoid tobacco smoke and other things that bother your lungs.  If given, take your antibiotic medicine as told. Finish the medicine even if you start to feel better.  Only take medicines as told by your doctor.  Drink enough fluids to keep your pee (urine) clear or pale yellow (unless your doctor has told you not to).  Use a cool mist machine (vaporizer).  If you use oxygen or a machine that turns liquid medicine into a mist (nebulizer), continue to use them as told.  Keep up with shots (vaccinations) as told by your doctor.  Exercise regularly.  Eat healthy foods.  Keep all doctor visits as told. GET HELP RIGHT AWAY IF:  You are very  short of breath and it gets worse.  You have trouble talking.  You have bad chest pain.  You have blood in your spit (sputum).  You have a fever.  You keep throwing up (vomiting).  You feel weak, or you pass out (faint).  You feel confused.  You keep getting worse. MAKE SURE YOU:  Understand these instructions.  Will watch your condition.  Will get help right away if you are not doing well or get worse.   This information is not intended to replace advice given to you by your health care provider. Make sure you discuss any questions you have with your health care provider.   Document Released: 07/11/2011 Document Revised: 08/12/2014 Document Reviewed: 03/26/2013 Elsevier Interactive Patient Education [employer].

## 2016-01-02 NOTE — ED Provider Notes (Signed)
CSN: [id number]     Arrival date & time 01/02/16  [id number] History   First MD Initiated Contact with Patient 01/02/16 [phone]     Chief Complaint  Patient presents with  . Leg Swelling     (Consider location/radiation/quality/duration/timing/severity/associated sxs/prior Treatment) HPI Comments: Patient is a 50 year old female who presents to the emergency department with a complaint of bilateral leg swelling.  The patient states that she feels that she has fluid on her lower extremities, right greater than left. She states that she feels as though she has swelling from her knee up to her thigh. She complains that she has some shortness of breath and some mild chest tightness when walking up steps or exerting herself. She states that this all resolved shortly after she sits down to rest. The patient states she was diagnosed with some congestive heart failure on a previous emergency department visit. She was given a prescription for Lasix, but she states she could not afford to get these filled. She does not weigh herself on a daily basis. She has not followed by anyone specific concerning her heart or her lungs. She states that at times when she coughs she notices something that looks dark or even black, but she has not had any hemoptysis in the last week. She feels as though she has to have her head elevated in order to rest. She states she does not rest well tried to lay flat. She also feels as though she has to have the fan blowing on her to give her the adequate oxygenation needs. She's not had any loss of consciousness. She has pain of the knees, and soreness in the thighs, but no frank pain of the extremities. She's not had any recent operations or procedures. She has not been diagnosed with any cancers. She's not bone on any long trips to be reported.  The history is provided by the patient.    Past Medical History  Diagnosis Date  . Type 2 diabetes mellitus ([place])   . Essential hypertension,  benign   . GERD (gastroesophageal reflux disease)   . COPD (chronic obstructive pulmonary disease) ([place])   . Arthritis   . Normal coronary arteries     3/10 - following abnormal Myoview  . Diabetes mellitus   . CHF (congestive heart failure) ([place])   . Chronic abdominal pain   . Ovarian cyst   . Gout   . Gout 2016   Past Surgical History  Procedure Laterality Date  . Scar revision  09/10/2011    Procedure: SCAR REVISION;  Surgeon: Jonnie Kind, MD;  Location: [facility];  Service: Gynecology;  Laterality: N/A;  Wide Excision of old Cicatrix  . Cesarean section  [id number], and 1994  . Abdominal hysterectomy  09/10/2011    Procedure: HYSTERECTOMY ABDOMINAL;  Surgeon: Jonnie Kind, MD;  Location: [facility];  Service: Gynecology;  Laterality: N/A;  Abdominal hysterectomy  . Cholecystectomy  1995  . Tubal ligation  1994   Family History  Problem Relation Age of Onset  . Cirrhosis Mother   . Diabetes type II Father   . Diabetes type II Sister   . Anesthesia problems Neg Hx   . Hypotension Neg Hx   . Malignant hyperthermia Neg Hx   . Pseudochol deficiency Neg Hx    Social History  Substance Use Topics  . Smoking status: Former Smoker -- 0.25 packs/day for 29 years    Types: Cigarettes    Quit date: 08/31/2015  . Smokeless  tobacco: Never Used     Comment: 1 cigarette every 3 days  . Alcohol Use: No   OB History    Gravida Para Term Preterm AB TAB SAB Ectopic Multiple Living            3     Review of Systems  Respiratory: Positive for chest tightness and shortness of breath.        Dyspnea with exertion  Cardiovascular: Positive for leg swelling.  Musculoskeletal: Positive for arthralgias.  All other systems reviewed and are negative.     Allergies  Bee venom; Naproxen; and Penicillins  Home Medications   Prior to Admission medications   Medication Sig Start Date End Date Taking? Authorizing Provider  albuterol (PROVENTIL HFA;VENTOLIN HFA) 108 (90 BASE) MCG/ACT  inhaler Inhale 1-2 puffs into the lungs every 6 (six) hours as needed for wheezing. 08/25/12   Nat Christen, MD  furosemide (LASIX) 20 MG tablet Take 1 tablet (20 mg total) by mouth 2 (two) times daily. Patient not taking: Reported on 10/24/2015 09/14/15   Lily Kocher, PA-C  HYDROcodone-acetaminophen (NORCO/VICODIN) 5-325 MG tablet Take 1 tablet by mouth every 6 (six) hours as needed for moderate pain. 12/20/15   Merryl Hacker, MD  ibuprofen (ADVIL,MOTRIN) 600 MG tablet Take 1 tablet (600 mg total) by mouth every 6 (six) hours as needed. 12/20/15   Merryl Hacker, MD  Insulin Glargine (LANTUS SOLOSTAR) 100 UNIT/ML Solostar Pen Inject 20 Units into the skin at bedtime.     Historical Provider, MD  losartan-hydrochlorothiazide (HYZAAR) 100-12.5 MG tablet Take 1 tablet by mouth daily. 06/07/15   Soyla Dryer, PA-C  metoprolol (LOPRESSOR) 100 MG tablet Take 1 tablet (100 mg total) by mouth 2 (two) times daily. Patient taking differently: Take 50 mg by mouth 2 (two) times daily.  08/21/15   Soyla Dryer, PA-C  omeprazole (PRILOSEC) 20 MG capsule Take 1 capsule (20 mg total) by mouth daily. Patient taking differently: Take 40 mg by mouth daily.  08/16/14   Francine Graven, DO  ondansetron (ZOFRAN ODT) 4 MG disintegrating tablet Take 1 tablet (4 mg total) by mouth every 8 (eight) hours as needed for nausea or vomiting. 10/24/15   Carmin Muskrat, MD  potassium chloride 20 MEQ TBCR Take 40 mEq by mouth daily. Patient not taking: Reported on 10/24/2015 10/02/15   Tammy Triplett, PA-C  predniSONE (DELTASONE) 50 MG tablet One tablet PO daily for 4 days Please check your sugar frequently while taking this medication 10/31/15   Ripley Fraise, MD  Probiotic Product (ALIGN PO) Take by mouth daily.    Historical Provider, MD  sitaGLIPtin-metformin (JANUMET) 50-1000 MG tablet Take 1 tablet by mouth 2 (two) times daily with a meal. 06/07/15   Soyla Dryer, PA-C   BP 171/87 mmHg  Pulse 82  Temp(Src) 97.8 F  (36.6 C) (Oral)  Resp 18  Ht 5\' 6"  (1.676 m)  Wt 96.163 kg  BMI 34.23 kg/m2  SpO2 99%  LMP 08/21/2011 Physical Exam  Constitutional: [name], place, [name]  Non-toxic appearance.  HENT:  Head: Normocephalic.  Right Ear: Tympanic membrane and external ear normal.  Left Ear: Tympanic membrane and external ear normal.  Eyes: EOM and lids are normal. Pupils are equal, round, and reactive to light.  Neck: Normal range of motion. Neck supple. Carotid bruit is not present.  Cardiovascular: Normal rate, regular rhythm, intact distal pulses and normal pulses.   Murmur heard. Pulmonary/Chest: Breath  sounds normal. No respiratory distress.  The patient speaks in complete sentences. There is symmetrical rise and fall of the chest. No rales or wheezes appreciated at this time.  Abdominal: Soft. Bowel sounds are normal. There is no tenderness. There is no guarding.  Musculoskeletal: Normal range of motion.  The dorsalis pedis pulses are 2+ bilaterally. There is a negative Homans sign bilaterally. There is no pitting edema of the lower extremities. There are deformity of degenerative changes of the right greater than left knee. There is pain to palpation and movement of the left knee. There is no effusion appreciated. No posterior mass noted. No hot areas appreciated of right or left knee. There is pain to movement and palpation of the left hip on, but no deformity appreciated.  Lymphadenopathy:       Head (right side): No submandibular adenopathy present.       Head (left side): No submandibular adenopathy present.    She has no cervical adenopathy.  Neurological: She is alert and oriented to [place], place, and time. She has normal strength. No cranial nerve deficit or sensory deficit.  Skin: Skin is warm and dry.  Psychiatric: She has a normal mood and affect. Her speech is normal.  Nursing note and vitals reviewed.   ED Course   Procedures (including critical care time) Labs Review Labs Reviewed  BASIC METABOLIC PANEL - Abnormal; Notable for the following:    Glucose, Bld 218 (*)    All other components within normal limits  BRAIN NATRIURETIC PEPTIDE - Abnormal; Notable for the following:    B Natriuretic Peptide 273.0 (*)    All other components within normal limits  CBC  TROPONIN I    Imaging Review Dg Chest 2 View  01/02/2016  CLINICAL DATA:  Short of breath EXAM: CHEST  2 VIEW COMPARISON:  10/24/2015 FINDINGS: Cardiac enlargement without heart failure. Heart size is unchanged from the prior study. Lungs remain clear without infiltrate or effusion. Negative for mass or adenopathy. IMPRESSION: No active cardiopulmonary disease. Electronically Signed   By: Franchot Gallo M.D.   On: 01/02/2016 09:28   I have personally reviewed and evaluated these images and lab results as part of my medical decision-making.   EKG Interpretation None      MDM Vital signs reviewed. The basic metabolic panel shows the glucose to be elevated at 218, otherwise within normal limits. The complete blood count is well within normal limits. The initial troponin is negative.  11:06 - discussed findings up to this point with patient. Non-specific changes on EKG, first troponin neg. Will recheck trop. 2nd EKG to monitor for changes, or possible lead placement problem. D-Dimer ordered.  D-dimer is normal at 0.32. Well's criteria is reveals a low probability of DVT. Second troponin is negative at less than 0.03. Second electrocardiogram shows a normal sinus rhythm at 72 bpm. There is some left atrial enlargement present. There is an old anterior infarct present. The first EKG question some mild ST elevation at leads 23 aVF, and the QRS at leads V5 and V6 were inverted. On the second electrocardiogram there is slow R-wave progression between V1 and V4, but the QRS at V5 and V6 is back in an upright position. Feel that the initial  electrocardiogram was probably changed because of lead placement. No acute or emergent changes noted at this time   Suspect that the patient has an exacerbation of the degenerative joint disease involving the lower extremities, and perhaps an exacerbation of her chronic  obstructive pulmonary disease.  Patient given resources to see the [place] [facility], as well as resources for the clinic in [place]. Rx for norco given to the pt for assist with knee/leg pain.   Final diagnoses:  Primary osteoarthritis of right knee  Chronic obstructive pulmonary disease, unspecified COPD type ([place])    **I have reviewed nursing notes, vital signs, and all appropriate lab and imaging results for this patient.Lily Kocher, PA-C 01/02/16 1654  Elnora Morrison, MD 01/03/16 [phone]

## 2016-01-02 NOTE — ED Notes (Signed)
PA at bedside.

## 2016-01-02 NOTE — ED Notes (Addendum)
Pt reports bilateral lower extremity edema, dyspnea with exertion, and increase in fatigue. Pt has hx of CHF. Denies any CP.VSS. NAD noted.

## 2016-01-23 ENCOUNTER — Emergency Department (HOSPITAL_COMMUNITY)
Admission: EM | Admit: 2016-01-23 | Discharge: 2016-01-23 | Disposition: A | Discharge status: Home / Self Care | Payer: Self-pay | Attending: Emergency Medicine | Admitting: Emergency Medicine

## 2016-01-23 ENCOUNTER — Encounter (HOSPITAL_COMMUNITY): Payer: Self-pay | Admitting: *Deleted

## 2016-01-23 DIAGNOSIS — J449 Chronic obstructive pulmonary disease, unspecified: Secondary | ICD-10-CM | POA: Insufficient documentation

## 2016-01-23 DIAGNOSIS — E119 Type 2 diabetes mellitus without complications: Secondary | ICD-10-CM | POA: Insufficient documentation

## 2016-01-23 DIAGNOSIS — Z794 Long term (current) use of insulin: Secondary | ICD-10-CM | POA: Insufficient documentation

## 2016-01-23 DIAGNOSIS — L299 Pruritus, unspecified: Secondary | ICD-10-CM | POA: Insufficient documentation

## 2016-01-23 DIAGNOSIS — I110 Hypertensive heart disease with heart failure: Secondary | ICD-10-CM | POA: Insufficient documentation

## 2016-01-23 DIAGNOSIS — Z7984 Long term (current) use of oral hypoglycemic drugs: Secondary | ICD-10-CM | POA: Insufficient documentation

## 2016-01-23 DIAGNOSIS — Z79899 Other long term (current) drug therapy: Secondary | ICD-10-CM | POA: Insufficient documentation

## 2016-01-23 DIAGNOSIS — Z87891 Personal history of nicotine dependence: Secondary | ICD-10-CM | POA: Insufficient documentation

## 2016-01-23 DIAGNOSIS — M1990 Unspecified osteoarthritis, unspecified site: Secondary | ICD-10-CM | POA: Insufficient documentation

## 2016-01-23 DIAGNOSIS — I509 Heart failure, unspecified: Secondary | ICD-10-CM | POA: Insufficient documentation

## 2016-01-23 LAB — CBG MONITORING, ED: Glucose-Capillary: 161 mg/dL — ABNORMAL HIGH (ref 65–99)

## 2016-01-23 MED ORDER — CLONIDINE HCL 0.2 MG PO TABS
0.2000 mg | ORAL_TABLET | Freq: Every day | ORAL | Status: DC
  Administered 2016-01-23: 0.2 mg via ORAL
  Filled 2016-01-23: qty 1

## 2016-01-23 NOTE — ED Notes (Signed)
Pt c/o itching to right leg; pt states it usually occurs when her sugar is high

## 2016-01-23 NOTE — ED Provider Notes (Addendum)
CSN: [id number]     Arrival date & time 01/23/16  0434 History   First MD Initiated Contact with Patient 01/23/16 0528 AM    Chief Complaint  Patient presents with  . Pruritis     (Consider location/radiation/quality/duration/timing/severity/associated sxs/prior Treatment) HPI patient was a sleeping visitor in another patient's room when she decided to be seen in the ED. She states on June 18 she started getting itching that was around her left ankle but now it's around her right lateral lower leg. She states she is not aware of any insect bite. She states she did take Advil which helped. She denies fever, drainage, or chills. She denies anything new such as soaps or detergents.  PCP [facility]   Past Medical History  Diagnosis Date  . Type 2 diabetes mellitus ([place])   . Essential hypertension, benign   . GERD (gastroesophageal reflux disease)   . COPD (chronic obstructive pulmonary disease) ([place])   . Arthritis   . Normal coronary arteries     3/10 - following abnormal Myoview  . Diabetes mellitus   . CHF (congestive heart failure) ([place])   . Chronic abdominal pain   . Ovarian cyst   . Gout   . Gout 2016   Past Surgical History  Procedure Laterality Date  . Scar revision  09/10/2011    Procedure: SCAR REVISION;  Surgeon: Jonnie Kind, MD;  Location: [facility];  Service: Gynecology;  Laterality: N/A;  Wide Excision of old Cicatrix  . Cesarean section  [id number], and 1994  . Abdominal hysterectomy  09/10/2011    Procedure: HYSTERECTOMY ABDOMINAL;  Surgeon: Jonnie Kind, MD;  Location: [facility];  Service: Gynecology;  Laterality: N/A;  Abdominal hysterectomy  . Cholecystectomy  1995  . Tubal ligation  1994   Family History  Problem Relation Age of Onset  . Cirrhosis Mother   . Diabetes type II Father   . Diabetes type II Sister   . Anesthesia problems Neg Hx   . Hypotension Neg Hx   . Malignant hyperthermia Neg Hx   . Pseudochol deficiency Neg Hx     Social History  Substance Use Topics  . Smoking status: Former Smoker -- 0.25 packs/day for 29 years    Types: Cigarettes    Quit date: 08/31/2015  . Smokeless tobacco: Never Used     Comment: 1 cigarette every 3 days  . Alcohol Use: No  unemployed  OB History    Gravida Para Term Preterm AB TAB SAB Ectopic Multiple Living            3     Review of Systems  All other systems reviewed and are negative.     Allergies  Bee venom; Naproxen; and Penicillins  Home Medications   Prior to Admission medications   Medication Sig Start Date End Date Taking? Authorizing Provider  albuterol (PROVENTIL HFA;VENTOLIN HFA) 108 (90 BASE) MCG/ACT inhaler Inhale 1-2 puffs into the lungs every 6 (six) hours as needed for wheezing. 08/25/12   Nat Christen, MD  furosemide (LASIX) 20 MG tablet Take 1 tablet (20 mg total) by mouth 2 (two) times daily. Patient not taking: Reported on 10/24/2015 09/14/15   Lily Kocher, PA-C  HYDROcodone-acetaminophen (NORCO/VICODIN) 5-325 MG tablet Take 1 tablet by mouth every 4 (four) hours as needed. 01/02/16   Lily Kocher, PA-C  ibuprofen (ADVIL,MOTRIN) 600 MG tablet Take 1 tablet (600 mg total) by mouth every 6 (six) hours as needed. 12/20/15   Loma Sousa  F Horton, MD  Insulin Glargine (LANTUS SOLOSTAR) 100 UNIT/ML Solostar Pen Inject 20 Units into the skin at bedtime.     Historical Provider, MD  losartan-hydrochlorothiazide (HYZAAR) 100-12.5 MG tablet Take 1 tablet by mouth daily. 06/07/15   Soyla Dryer, PA-C  metoprolol (LOPRESSOR) 100 MG tablet Take 1 tablet (100 mg total) by mouth 2 (two) times daily. Patient taking differently: Take 50 mg by mouth 2 (two) times daily.  08/21/15   Soyla Dryer, PA-C  omeprazole (PRILOSEC) 20 MG capsule Take 1 capsule (20 mg total) by mouth daily. Patient taking differently: Take 40 mg by mouth daily.  08/16/14   Francine Graven, DO  ondansetron (ZOFRAN ODT) 4 MG disintegrating tablet Take 1 tablet (4 mg total) by mouth every  8 (eight) hours as needed for nausea or vomiting. 10/24/15   Carmin Muskrat, MD  potassium chloride 20 MEQ TBCR Take 40 mEq by mouth daily. Patient not taking: Reported on 10/24/2015 10/02/15   Tammy Triplett, PA-C  predniSONE (DELTASONE) 50 MG tablet One tablet PO daily for 4 days Please check your sugar frequently while taking this medication 10/31/15   Ripley Fraise, MD  Probiotic Product (ALIGN PO) Take by mouth daily.    Historical Provider, MD  sitaGLIPtin-metformin (JANUMET) 50-1000 MG tablet Take 1 tablet by mouth 2 (two) times daily with a meal. 06/07/15   Soyla Dryer, PA-C   BP 181/100 mmHg  Pulse 86  Temp(Src) 98 F (36.7 C) (Oral)  Resp 20  Ht 5\' 5"  (1.651 m)  Wt 198 lb (89.812 kg)  BMI 32.95 kg/m2  SpO2 99%  LMP 08/21/2011  Vital signs normal   Physical Exam  Constitutional: She is oriented to [place], place, [name]  Non-toxic appearance. She does not appear ill. No distress.  HENT:  Head: Normocephalic and atraumatic.  Right Ear: External ear normal.  Left Ear: External ear normal.  Nose: Nose normal. No mucosal edema or rhinorrhea.  Mouth/Throat: Mucous membranes are normal. No dental abscesses or uvula swelling.  Eyes: Conjunctivae and EOM are normal.  Neck: Normal range of motion and full passive range of motion without pain.  Cardiovascular: Normal rate.   Pulmonary/Chest: Effort normal. No respiratory distress. She has no rhonchi. She exhibits no crepitus.  Abdominal: Normal appearance.  Musculoskeletal: Normal range of motion. She exhibits no edema.  Moves all extremities well.   Neurological: She is alert and oriented to [place], place, and time. She has normal strength. No cranial nerve deficit.  Skin: Skin is warm, dry and intact. No rash noted. No erythema. No pallor.  Patient is is noted to be scratching vigorously of her right lower leg. She is noted to have some superficial excoriations. There are some  areas in the upper part of the lateral leg that may be from insect bite or from her scratching. There are no obvious open wounds. There are no obvious urticarial lesions seen. Her skin is not dry. Her skin is mildly warm to touch however she has just been scratching.  Psychiatric: She has a normal mood and affect. Her speech is normal and behavior is normal. Her mood appears not anxious.  Nursing note and vitals reviewed.   ED Course  Procedures (including critical care time) Patient was given Benadryl 50 mg by mouth and Pepcid 20 mg by mouth. She was advised she can take these over-the-counter. She should continue the cool compresses. She should be rechecked for any worsening signs of infection such as  fever, drainage, worsening pain or swelling.  Patient has pruritus of uncertain etiology, possible insect bite due to the time of year.   At 5:45 AM nurse reports patients blood pressure was extremely elevated at 185/119 and 207/124 in the other arm. She was given clonidine orally.  7 AM blood pressure improved to 172/92. Patient was discharged home.  Labs Review Labs Reviewed  CBG MONITORING, ED - Abnormal; Notable for the following:    Glucose-Capillary 161 (*)    All other components within normal limits     MDM   Final diagnoses:  Pruritus   Plan discharge  Rolland Porter, MD, Barbette Or, MD 01/23/16 [place], MD 01/23/16 [phone]

## 2016-01-23 NOTE — Discharge Instructions (Signed)
Use cool compresses to help relieve the itching. The itching will get worse a few scratch at it or use heat to the area. You can take Benadryl 50 mg over-the-counter every 6 hours for itching. You can also take Pepcid over-the-counter twice a day for the itching. Recheck if you get a fever, you start having drainage of fluid from the area, or you start having worsening swelling. Pruritus Pruritus is an itching feeling. There are many different conditions and factors that can make your skin itchy. Dry skin is one of the most common causes of itching. Most cases of itching do not require medical attention. Itchy skin can turn into a rash.  HOME CARE INSTRUCTIONS  Watch your pruritus for any changes. Take these steps to help with your condition:  Skin Care  Moisturize your skin as needed. A moisturizer that contains petroleum jelly is best for keeping moisture in your skin.  Take or apply medicines only as directed by your health care provider. This may include:  Corticosteroid cream.  Anti-itch lotions.  Oral anti-histamines.  Apply cool compresses to the affected areas.  Try taking a bath with:  Epsom salts. Follow the instructions on the packaging. You can get these at your local pharmacy or grocery store.  Baking soda. Pour a small amount into the bath as directed by your health care provider.  Colloidal oatmeal. Follow the instructions on the packaging. You can get this at your local pharmacy or grocery store.  Try applying baking soda paste to your skin. Stir water into baking soda until it reaches a paste-like consistency.   Do not scratch your skin.  Avoid hot showers or baths, which can make itching worse. A cold shower may help with itching as long as you use a moisturizer after.  Avoid scented soaps, detergents, and perfumes. Use gentle soaps, detergents, perfumes, and other cosmetic products. General Instructions  Avoid wearing tight clothes.  Keep a journal to help  track what causes your itch. Write down:  What you eat.  What cosmetic products you use.  What you drink.  What you wear. This includes jewelry.  Use a humidifier. This keeps the air moist, which helps to prevent dry skin. SEEK MEDICAL CARE IF:  The itching does not go away after several days.  You sweat at night.  You have weight loss.  You are unusually thirsty.  You urinate more than normal.  You are more tired than normal.  You have abdominal pain.  Your skin tingles.  You feel weak.  Your skin or the whites of your eyes look yellow (jaundice).  Your skin feels numb.   This information is not intended to replace advice given to you by your health care provider. Make sure you discuss any questions you have with your health care provider.   Document Released: 04/03/2011 Document Revised: 12/06/2014 Document Reviewed: 07/18/2014 Elsevier Interactive Patient Education [employer].

## 2016-01-23 NOTE — ED Notes (Signed)
MD Tomi Bamberger notified of BP of 185/119 and 207/124 taken in both arms. Orders given.

## 2016-04-12 ENCOUNTER — Encounter (HOSPITAL_COMMUNITY): Payer: Self-pay | Admitting: Emergency Medicine

## 2016-04-12 ENCOUNTER — Emergency Department (HOSPITAL_COMMUNITY)
Admission: EM | Admit: 2016-04-12 | Discharge: 2016-04-12 | Disposition: A | Discharge status: Home / Self Care | Payer: Self-pay | Attending: Dermatology | Admitting: Dermatology

## 2016-04-12 DIAGNOSIS — Z5321 Procedure and treatment not carried out due to patient leaving prior to being seen by health care provider: Secondary | ICD-10-CM | POA: Insufficient documentation

## 2016-04-12 DIAGNOSIS — J449 Chronic obstructive pulmonary disease, unspecified: Secondary | ICD-10-CM | POA: Insufficient documentation

## 2016-04-12 DIAGNOSIS — I110 Hypertensive heart disease with heart failure: Secondary | ICD-10-CM | POA: Insufficient documentation

## 2016-04-12 DIAGNOSIS — I509 Heart failure, unspecified: Secondary | ICD-10-CM | POA: Insufficient documentation

## 2016-04-12 DIAGNOSIS — Z87891 Personal history of nicotine dependence: Secondary | ICD-10-CM | POA: Insufficient documentation

## 2016-04-12 DIAGNOSIS — E119 Type 2 diabetes mellitus without complications: Secondary | ICD-10-CM | POA: Insufficient documentation

## 2016-04-12 DIAGNOSIS — R1084 Generalized abdominal pain: Secondary | ICD-10-CM | POA: Insufficient documentation

## 2016-04-12 NOTE — ED Triage Notes (Signed)
Patient c/o generalized abd pain that started 2 days ago and has progressively gotten worse. Denies any nausea, vomiting, diarrhea, fevers, or urinary symptoms. Per patient"bloated/pressure feeling. Per patient last BM yesterday-firm small amount. Per patient last normal BM 3 days ago. Denies any blood.

## 2016-06-14 ENCOUNTER — Emergency Department (HOSPITAL_COMMUNITY)
Admission: EM | Admit: 2016-06-14 | Discharge: 2016-06-14 | Disposition: A | Discharge status: Home / Self Care | Payer: No Typology Code available for payment source | Attending: Emergency Medicine | Admitting: Emergency Medicine

## 2016-06-14 ENCOUNTER — Encounter (HOSPITAL_COMMUNITY): Payer: Self-pay | Admitting: Emergency Medicine

## 2016-06-14 ENCOUNTER — Emergency Department (HOSPITAL_COMMUNITY): Payer: No Typology Code available for payment source

## 2016-06-14 DIAGNOSIS — S39012A Strain of muscle, fascia and tendon of lower back, initial encounter: Secondary | ICD-10-CM | POA: Insufficient documentation

## 2016-06-14 DIAGNOSIS — Z7984 Long term (current) use of oral hypoglycemic drugs: Secondary | ICD-10-CM | POA: Diagnosis not present

## 2016-06-14 DIAGNOSIS — S3992XA Unspecified injury of lower back, initial encounter: Secondary | ICD-10-CM | POA: Diagnosis present

## 2016-06-14 DIAGNOSIS — Z794 Long term (current) use of insulin: Secondary | ICD-10-CM | POA: Diagnosis not present

## 2016-06-14 DIAGNOSIS — Y999 Unspecified external cause status: Secondary | ICD-10-CM | POA: Insufficient documentation

## 2016-06-14 DIAGNOSIS — F17210 Nicotine dependence, cigarettes, uncomplicated: Secondary | ICD-10-CM | POA: Insufficient documentation

## 2016-06-14 DIAGNOSIS — J449 Chronic obstructive pulmonary disease, unspecified: Secondary | ICD-10-CM | POA: Insufficient documentation

## 2016-06-14 DIAGNOSIS — Y92410 Unspecified street and highway as the place of occurrence of the external cause: Secondary | ICD-10-CM | POA: Insufficient documentation

## 2016-06-14 DIAGNOSIS — S5001XA Contusion of right elbow, initial encounter: Secondary | ICD-10-CM

## 2016-06-14 DIAGNOSIS — I509 Heart failure, unspecified: Secondary | ICD-10-CM | POA: Insufficient documentation

## 2016-06-14 DIAGNOSIS — I110 Hypertensive heart disease with heart failure: Secondary | ICD-10-CM | POA: Diagnosis not present

## 2016-06-14 DIAGNOSIS — E119 Type 2 diabetes mellitus without complications: Secondary | ICD-10-CM | POA: Diagnosis not present

## 2016-06-14 DIAGNOSIS — Y939 Activity, unspecified: Secondary | ICD-10-CM | POA: Insufficient documentation

## 2016-06-14 DIAGNOSIS — I10 Essential (primary) hypertension: Secondary | ICD-10-CM
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
## 2016-06-14 MED ORDER — HYDROCODONE-ACETAMINOPHEN 5-325 MG PO TABS: 1.0000 | ORAL_TABLET | ORAL | 0 refills | Status: DC | PRN

## 2016-06-14 MED ORDER — HYDROCODONE-ACETAMINOPHEN 5-325 MG PO TABS
1.0000 | ORAL_TABLET | Freq: Once | ORAL | Status: AC
  Administered 2016-06-14: 1 via ORAL
  Filled 2016-06-14: qty 1

## 2016-06-14 MED ORDER — METOPROLOL TARTRATE 50 MG PO TABS: 50.0000 mg | ORAL_TABLET | Freq: Two times a day (BID) | ORAL | 0 refills | Status: DC
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
## 2016-06-17 ENCOUNTER — Emergency Department (HOSPITAL_COMMUNITY): Payer: No Typology Code available for payment source

## 2016-06-17 ENCOUNTER — Encounter (HOSPITAL_COMMUNITY): Payer: Self-pay | Admitting: *Deleted

## 2016-06-17 ENCOUNTER — Emergency Department (HOSPITAL_COMMUNITY)
Admission: EM | Admit: 2016-06-17 | Discharge: 2016-06-17 | Disposition: A | Discharge status: Home / Self Care | Payer: No Typology Code available for payment source | Attending: Emergency Medicine | Admitting: Emergency Medicine

## 2016-06-17 DIAGNOSIS — Y939 Activity, unspecified: Secondary | ICD-10-CM | POA: Insufficient documentation

## 2016-06-17 DIAGNOSIS — Y92410 Unspecified street and highway as the place of occurrence of the external cause: Secondary | ICD-10-CM | POA: Insufficient documentation

## 2016-06-17 DIAGNOSIS — S20212A Contusion of left front wall of thorax, initial encounter: Secondary | ICD-10-CM | POA: Insufficient documentation

## 2016-06-17 DIAGNOSIS — Z79899 Other long term (current) drug therapy: Secondary | ICD-10-CM | POA: Insufficient documentation

## 2016-06-17 DIAGNOSIS — S3991XA Unspecified injury of abdomen, initial encounter: Secondary | ICD-10-CM | POA: Diagnosis present

## 2016-06-17 DIAGNOSIS — Z794 Long term (current) use of insulin: Secondary | ICD-10-CM | POA: Insufficient documentation

## 2016-06-17 DIAGNOSIS — I110 Hypertensive heart disease with heart failure: Secondary | ICD-10-CM | POA: Insufficient documentation

## 2016-06-17 DIAGNOSIS — E119 Type 2 diabetes mellitus without complications: Secondary | ICD-10-CM | POA: Diagnosis not present

## 2016-06-17 DIAGNOSIS — S301XXA Contusion of abdominal wall, initial encounter: Secondary | ICD-10-CM

## 2016-06-17 DIAGNOSIS — Y999 Unspecified external cause status: Secondary | ICD-10-CM | POA: Insufficient documentation

## 2016-06-17 DIAGNOSIS — J449 Chronic obstructive pulmonary disease, unspecified: Secondary | ICD-10-CM | POA: Insufficient documentation

## 2016-06-17 DIAGNOSIS — F17210 Nicotine dependence, cigarettes, uncomplicated: Secondary | ICD-10-CM | POA: Diagnosis not present

## 2016-06-17 DIAGNOSIS — R0789 Other chest pain: Secondary | ICD-10-CM

## 2016-06-17 DIAGNOSIS — I509 Heart failure, unspecified: Secondary | ICD-10-CM | POA: Diagnosis not present

## 2016-06-17 LAB — COMPREHENSIVE METABOLIC PANEL
ALT: 25 U/L (ref 14–54)
AST: 34 U/L (ref 15–41)
Albumin: 3.3 g/dL — ABNORMAL LOW (ref 3.5–5.0)
Alkaline Phosphatase: 92 U/L (ref 38–126)
Anion gap: 6 (ref 5–15)
BUN: 13 mg/dL (ref 6–20)
CO2: 25 mmol/L (ref 22–32)
Calcium: 8.4 mg/dL — ABNORMAL LOW (ref 8.9–10.3)
Chloride: 107 mmol/L (ref 101–111)
Creatinine, Ser: 0.74 mg/dL (ref 0.44–1.00)
GFR calc Af Amer: >60 mL/min (ref >60)
GFR calc non Af Amer: >60 mL/min (ref >60)
Glucose, Bld: 197 mg/dL — ABNORMAL HIGH (ref 65–99)
Potassium: 3.6 mmol/L (ref 3.5–5.1)
Sodium: 138 mmol/L (ref 135–145)
Total Bilirubin: 0.7 mg/dL (ref 0.3–1.2)
Total Protein: 6.4 g/dL — ABNORMAL LOW (ref 6.5–8.1)

## 2016-06-17 LAB — CBC WITH DIFFERENTIAL/PLATELET
Basophils Absolute: 0 10*3/uL (ref 0.0–0.1)
Basophils Relative: 1 %
Eosinophils Absolute: 0.2 10*3/uL (ref 0.0–0.7)
Eosinophils Relative: 3 %
HCT: 43.7 % (ref 36.0–46.0)
Hemoglobin: 14.4 g/dL (ref 12.0–15.0)
Lymphocytes Relative: 54 %
Lymphs Abs: 2.7 10*3/uL (ref 0.7–4.0)
MCH: 29.4 pg (ref 26.0–34.0)
MCHC: 33 g/dL (ref 30.0–36.0)
MCV: 89.4 fL (ref 78.0–100.0)
Monocytes Absolute: 0.4 10*3/uL (ref 0.1–1.0)
Monocytes Relative: 7 %
Neutro Abs: 1.8 10*3/uL (ref 1.7–7.7)
Neutrophils Relative %: 35 %
Platelets: 192 10*3/uL (ref 150–400)
RBC: 4.89 MIL/uL (ref 3.87–5.11)
RDW: 13.5 % (ref 11.5–15.5)
WBC: 5.1 10*3/uL (ref 4.0–10.5)

## 2016-06-17 LAB — TROPONIN I: Troponin I: 0.04 ng/mL (ref <0.03)

## 2016-06-17 LAB — LIPASE, BLOOD: Lipase: 19 U/L (ref 11–51)
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
## 2016-06-17 MED ORDER — FUROSEMIDE 20 MG PO TABS: 20.0000 mg | ORAL_TABLET | Freq: Two times a day (BID) | ORAL | 0 refills | Status: DC

## 2016-06-17 MED ORDER — HYDROCODONE-ACETAMINOPHEN 5-325 MG PO TABS: 1.0000 | ORAL_TABLET | Freq: Four times a day (QID) | ORAL | 0 refills | Status: DC | PRN

## 2016-06-17 MED ORDER — MORPHINE SULFATE (PF) 4 MG/ML IV SOLN
4.0000 mg | Freq: Once | INTRAVENOUS | Status: AC
  Administered 2016-06-17: 4 mg via INTRAVENOUS
  Filled 2016-06-17: qty 1

## 2016-06-17 MED ORDER — ONDANSETRON HCL 4 MG/2ML IJ SOLN
4.0000 mg | Freq: Once | INTRAMUSCULAR | Status: AC
  Administered 2016-06-17: 4 mg via INTRAVENOUS
  Filled 2016-06-17: qty 2

## 2016-06-17 MED ORDER — IOPAMIDOL (ISOVUE-300) INJECTION 61%
100.0000 mL | Freq: Once | INTRAVENOUS | Status: AC | PRN
  Administered 2016-06-17: 100 mL via INTRAVENOUS

## 2016-06-17 NOTE — ED Notes (Signed)
Pt taken to xray 
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
## 2016-06-17 NOTE — ED Notes (Signed)
CRITICAL VALUE ALERT  Critical value received:  Troponin 0.04  Date of notification:  06/17/2016  Time of notification:  0654  Critical value read back: yes  Nurse who received alert:  Natividad Brood, RN  MD notified (1st page):  Dr. Betsey Holiday  Time of first page:  [phone]  MD notified (2nd page):  Time of second page:  Responding MD:  Dr. Betsey Holiday  Time MD responded:  [phone]
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
## 2016-06-17 NOTE — ED Notes (Addendum)
CT made aware that the pt has finished drinking her contrast.
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
## 2016-06-17 NOTE — Discharge Instructions (Signed)
Lasix as prescribed.  Hydrocodone as prescribed as needed for pain.  Follow-up with your primary Dr. in one week for recheck, and return to the ER if your symptoms significantly worsen or change.

## 2016-07-06 ENCOUNTER — Emergency Department (HOSPITAL_COMMUNITY)
Admission: EM | Admit: 2016-07-06 | Discharge: 2016-07-06 | Disposition: A | Discharge status: Home / Self Care | Payer: No Typology Code available for payment source | Attending: Emergency Medicine | Admitting: Emergency Medicine

## 2016-07-06 ENCOUNTER — Encounter (HOSPITAL_COMMUNITY): Payer: Self-pay | Admitting: Emergency Medicine

## 2016-07-06 DIAGNOSIS — F17210 Nicotine dependence, cigarettes, uncomplicated: Secondary | ICD-10-CM | POA: Diagnosis not present

## 2016-07-06 DIAGNOSIS — M791 Myalgia: Secondary | ICD-10-CM | POA: Insufficient documentation

## 2016-07-06 DIAGNOSIS — Z760 Encounter for issue of repeat prescription: Secondary | ICD-10-CM | POA: Diagnosis present

## 2016-07-06 DIAGNOSIS — R05 Cough: Secondary | ICD-10-CM | POA: Insufficient documentation

## 2016-07-06 DIAGNOSIS — J449 Chronic obstructive pulmonary disease, unspecified: Secondary | ICD-10-CM | POA: Insufficient documentation

## 2016-07-06 DIAGNOSIS — Z79899 Other long term (current) drug therapy: Secondary | ICD-10-CM | POA: Diagnosis not present

## 2016-07-06 DIAGNOSIS — I110 Hypertensive heart disease with heart failure: Secondary | ICD-10-CM | POA: Insufficient documentation

## 2016-07-06 DIAGNOSIS — E119 Type 2 diabetes mellitus without complications: Secondary | ICD-10-CM | POA: Insufficient documentation

## 2016-07-06 DIAGNOSIS — I509 Heart failure, unspecified: Secondary | ICD-10-CM | POA: Diagnosis not present

## 2016-07-06 DIAGNOSIS — Z794 Long term (current) use of insulin: Secondary | ICD-10-CM | POA: Insufficient documentation

## 2016-07-06 MED ORDER — LOSARTAN POTASSIUM-HCTZ 100-12.5 MG PO TABS: 1.0000 | ORAL_TABLET | Freq: Every day | ORAL | 0 refills | Status: DC

## 2016-07-06 MED ORDER — FUROSEMIDE 20 MG PO TABS: 20.0000 mg | ORAL_TABLET | Freq: Two times a day (BID) | ORAL | 0 refills | Status: DC

## 2016-07-06 MED ORDER — METOPROLOL TARTRATE 50 MG PO TABS: 50.0000 mg | ORAL_TABLET | Freq: Two times a day (BID) | ORAL | 0 refills | Status: DC

## 2016-07-06 MED ORDER — IBUPROFEN 600 MG PO TABS: 600.0000 mg | ORAL_TABLET | Freq: Four times a day (QID) | ORAL | 0 refills | Status: DC | PRN

## 2016-07-06 MED ORDER — FUROSEMIDE 40 MG PO TABS
20.0000 mg | ORAL_TABLET | Freq: Once | ORAL | Status: AC
  Administered 2016-07-06: 20 mg via ORAL
  Filled 2016-07-06: qty 1

## 2016-07-06 MED ORDER — METOPROLOL TARTRATE 50 MG PO TABS
50.0000 mg | ORAL_TABLET | Freq: Once | ORAL | Status: AC
  Administered 2016-07-06: 50 mg via ORAL
  Filled 2016-07-06: qty 1
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
## 2016-07-09 ENCOUNTER — Emergency Department (HOSPITAL_COMMUNITY): Payer: Self-pay

## 2016-07-09 ENCOUNTER — Encounter (HOSPITAL_COMMUNITY): Payer: Self-pay | Admitting: Emergency Medicine

## 2016-07-09 ENCOUNTER — Inpatient Hospital Stay (HOSPITAL_COMMUNITY)
Admission: EM | Admit: 2016-07-09 | Discharge: 2016-07-12 | DRG: 292 | Disposition: A | Discharge status: Home / Self Care | Payer: Self-pay | Attending: Internal Medicine | Admitting: Internal Medicine

## 2016-07-09 DIAGNOSIS — I110 Hypertensive heart disease with heart failure: Principal | ICD-10-CM | POA: Diagnosis present

## 2016-07-09 DIAGNOSIS — E1165 Type 2 diabetes mellitus with hyperglycemia: Secondary | ICD-10-CM | POA: Diagnosis present

## 2016-07-09 DIAGNOSIS — Y92009 Unspecified place in unspecified non-institutional (private) residence as the place of occurrence of the external cause: Secondary | ICD-10-CM

## 2016-07-09 DIAGNOSIS — Z23 Encounter for immunization: Secondary | ICD-10-CM

## 2016-07-09 DIAGNOSIS — Z833 Family history of diabetes mellitus: Secondary | ICD-10-CM

## 2016-07-09 DIAGNOSIS — Z794 Long term (current) use of insulin: Secondary | ICD-10-CM

## 2016-07-09 DIAGNOSIS — I5020 Unspecified systolic (congestive) heart failure: Secondary | ICD-10-CM

## 2016-07-09 DIAGNOSIS — Z91120 Patient's intentional underdosing of medication regimen due to financial hardship: Secondary | ICD-10-CM

## 2016-07-09 DIAGNOSIS — F17210 Nicotine dependence, cigarettes, uncomplicated: Secondary | ICD-10-CM | POA: Diagnosis present

## 2016-07-09 DIAGNOSIS — J811 Chronic pulmonary edema: Secondary | ICD-10-CM | POA: Diagnosis present

## 2016-07-09 DIAGNOSIS — I5031 Acute diastolic (congestive) heart failure: Secondary | ICD-10-CM | POA: Diagnosis present

## 2016-07-09 DIAGNOSIS — J449 Chronic obstructive pulmonary disease, unspecified: Secondary | ICD-10-CM | POA: Diagnosis present

## 2016-07-09 DIAGNOSIS — I161 Hypertensive emergency: Secondary | ICD-10-CM | POA: Diagnosis present

## 2016-07-09 DIAGNOSIS — K219 Gastro-esophageal reflux disease without esophagitis: Secondary | ICD-10-CM | POA: Diagnosis present

## 2016-07-09 DIAGNOSIS — T501X6A Underdosing of loop [high-ceiling] diuretics, initial encounter: Secondary | ICD-10-CM | POA: Diagnosis present

## 2016-07-09 DIAGNOSIS — E876 Hypokalemia: Secondary | ICD-10-CM | POA: Diagnosis present

## 2016-07-09 DIAGNOSIS — N179 Acute kidney failure, unspecified: Secondary | ICD-10-CM | POA: Diagnosis not present

## 2016-07-09 LAB — CBC WITH DIFFERENTIAL/PLATELET
Basophils Absolute: 0.1 10*3/uL (ref 0.0–0.1)
Basophils Relative: 1 %
Eosinophils Absolute: 0.2 10*3/uL (ref 0.0–0.7)
Eosinophils Relative: 3 %
HCT: 41.1 % (ref 36.0–46.0)
Hemoglobin: 13.6 g/dL (ref 12.0–15.0)
Lymphocytes Relative: 38 %
Lymphs Abs: 2.2 10*3/uL (ref 0.7–4.0)
MCH: 29.2 pg (ref 26.0–34.0)
MCHC: 33.1 g/dL (ref 30.0–36.0)
MCV: 88.2 fL (ref 78.0–100.0)
Monocytes Absolute: 0.4 10*3/uL (ref 0.1–1.0)
Monocytes Relative: 6 %
Neutro Abs: 2.9 10*3/uL (ref 1.7–7.7)
Neutrophils Relative %: 52 %
Platelets: 205 10*3/uL (ref 150–400)
RBC: 4.66 MIL/uL (ref 3.87–5.11)
RDW: 13.2 % (ref 11.5–15.5)
WBC: 5.6 10*3/uL (ref 4.0–10.5)

## 2016-07-09 LAB — COMPREHENSIVE METABOLIC PANEL
ALT: 40 U/L (ref 14–54)
AST: 70 U/L — ABNORMAL HIGH (ref 15–41)
Albumin: 3.4 g/dL — ABNORMAL LOW (ref 3.5–5.0)
Alkaline Phosphatase: 97 U/L (ref 38–126)
Anion gap: 6 (ref 5–15)
BUN: 13 mg/dL (ref 6–20)
CO2: 23 mmol/L (ref 22–32)
Calcium: 8.9 mg/dL (ref 8.9–10.3)
Chloride: 106 mmol/L (ref 101–111)
Creatinine, Ser: 0.56 mg/dL (ref 0.44–1.00)
GFR calc Af Amer: >60 mL/min (ref >60)
GFR calc non Af Amer: >60 mL/min (ref >60)
Glucose, Bld: 243 mg/dL — ABNORMAL HIGH (ref 65–99)
Potassium: 3.9 mmol/L (ref 3.5–5.1)
Sodium: 135 mmol/L (ref 135–145)
Total Bilirubin: 0.5 mg/dL (ref 0.3–1.2)
Total Protein: 6.3 g/dL — ABNORMAL LOW (ref 6.5–8.1)

## 2016-07-09 LAB — MRSA PCR SCREENING: MRSA by PCR: NEGATIVE

## 2016-07-09 LAB — TROPONIN I: Troponin I: <0.03 ng/mL (ref <0.03)

## 2016-07-09 LAB — BRAIN NATRIURETIC PEPTIDE: B Natriuretic Peptide: 907 pg/mL — ABNORMAL HIGH (ref 0.0–100.0)

## 2016-07-09 LAB — GLUCOSE, CAPILLARY
Glucose-Capillary: 282 mg/dL — ABNORMAL HIGH (ref 65–99)
Glucose-Capillary: 267 mg/dL — ABNORMAL HIGH (ref 65–99)

## 2016-07-09 LAB — I-STAT TROPONIN, ED: Troponin i, poc: 0.01 ng/mL (ref 0.00–0.08)
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
## 2016-07-09 MED ORDER — INFLUENZA VAC SPLIT QUAD 0.5 ML IM SUSY: 0.5000 mL | PREFILLED_SYRINGE | INTRAMUSCULAR | Status: DC

## 2016-07-09 MED ORDER — NITROGLYCERIN 2 % TD OINT
1.0000 [in_us] | TOPICAL_OINTMENT | Freq: Once | TRANSDERMAL | Status: AC
  Administered 2016-07-09: 1 [in_us] via TOPICAL
  Filled 2016-07-09: qty 1

## 2016-07-09 MED ORDER — ONDANSETRON HCL 4 MG/2ML IJ SOLN
INTRAMUSCULAR | Status: AC
  Filled 2016-07-09: qty 2

## 2016-07-09 MED ORDER — NITROGLYCERIN IN D5W 200-5 MCG/ML-% IV SOLN
0.0000 ug/min | INTRAVENOUS | Status: DC
  Administered 2016-07-09: 5 ug/min via INTRAVENOUS

## 2016-07-09 MED ORDER — ONDANSETRON HCL 4 MG/2ML IJ SOLN
4.0000 mg | Freq: Once | INTRAMUSCULAR | Status: AC
  Administered 2016-07-09: 4 mg via INTRAVENOUS

## 2016-07-09 MED ORDER — HYDRALAZINE HCL 20 MG/ML IJ SOLN
5.0000 mg | Freq: Once | INTRAMUSCULAR | Status: AC
  Administered 2016-07-09: 5 mg via INTRAVENOUS
  Filled 2016-07-09: qty 1

## 2016-07-09 MED ORDER — ACETAMINOPHEN 325 MG PO TABS
650.0000 mg | ORAL_TABLET | ORAL | Status: DC | PRN
  Administered 2016-07-09 – 2016-07-11 (×4): 650 mg via ORAL
  Filled 2016-07-09 (×4): qty 2

## 2016-07-09 MED ORDER — FUROSEMIDE 10 MG/ML IJ SOLN
40.0000 mg | Freq: Two times a day (BID) | INTRAMUSCULAR | Status: DC
  Administered 2016-07-09 – 2016-07-11 (×4): 40 mg via INTRAVENOUS
  Filled 2016-07-09 (×4): qty 4

## 2016-07-09 MED ORDER — SODIUM CHLORIDE 0.9% FLUSH: 3.0000 mL | INTRAVENOUS | Status: DC | PRN

## 2016-07-09 MED ORDER — INSULIN ASPART 100 UNIT/ML ~~LOC~~ SOLN
4.0000 [IU] | Freq: Three times a day (TID) | SUBCUTANEOUS | Status: DC
Start: 2016-07-10 — End: 2016-07-12
  Administered 2016-07-10 – 2016-07-11 (×6): 4 [IU] via SUBCUTANEOUS

## 2016-07-09 MED ORDER — METOPROLOL TARTRATE 50 MG PO TABS
50.0000 mg | ORAL_TABLET | Freq: Two times a day (BID) | ORAL | Status: DC
  Administered 2016-07-09 – 2016-07-12 (×6): 50 mg via ORAL
  Filled 2016-07-09 (×6): qty 1

## 2016-07-09 MED ORDER — MORPHINE SULFATE (PF) 4 MG/ML IV SOLN
4.0000 mg | Freq: Once | INTRAVENOUS | Status: AC
  Administered 2016-07-09: 4 mg via INTRAVENOUS
  Filled 2016-07-09: qty 1

## 2016-07-09 MED ORDER — HYDROCHLOROTHIAZIDE 12.5 MG PO CAPS
12.5000 mg | ORAL_CAPSULE | Freq: Every day | ORAL | Status: DC
  Administered 2016-07-09 – 2016-07-11 (×3): 12.5 mg via ORAL
  Filled 2016-07-09 (×4): qty 1

## 2016-07-09 MED ORDER — LOSARTAN POTASSIUM 50 MG PO TABS
100.0000 mg | ORAL_TABLET | Freq: Every day | ORAL | Status: DC
  Administered 2016-07-09 – 2016-07-12 (×4): 100 mg via ORAL
  Filled 2016-07-09 (×4): qty 2

## 2016-07-09 MED ORDER — NITROGLYCERIN IN D5W 200-5 MCG/ML-% IV SOLN
5.0000 ug/min | INTRAVENOUS | Status: DC
  Administered 2016-07-09: 5 ug/min via INTRAVENOUS
  Filled 2016-07-09: qty 250

## 2016-07-09 MED ORDER — ONDANSETRON HCL 4 MG/2ML IJ SOLN
4.0000 mg | Freq: Four times a day (QID) | INTRAMUSCULAR | Status: DC | PRN
  Administered 2016-07-09: 4 mg via INTRAVENOUS
  Filled 2016-07-09: qty 2

## 2016-07-09 MED ORDER — ENOXAPARIN SODIUM 40 MG/0.4ML ~~LOC~~ SOLN
40.0000 mg | SUBCUTANEOUS | Status: DC
  Administered 2016-07-09 – 2016-07-11 (×3): 40 mg via SUBCUTANEOUS
  Filled 2016-07-09 (×3): qty 0.4

## 2016-07-09 MED ORDER — ASPIRIN EC 81 MG PO TBEC
81.0000 mg | DELAYED_RELEASE_TABLET | Freq: Every day | ORAL | Status: DC
  Administered 2016-07-09 – 2016-07-12 (×4): 81 mg via ORAL
  Filled 2016-07-09 (×4): qty 1

## 2016-07-09 MED ORDER — INSULIN ASPART 100 UNIT/ML ~~LOC~~ SOLN
0.0000 [IU] | Freq: Three times a day (TID) | SUBCUTANEOUS | Status: DC
  Administered 2016-07-10: 11 [IU] via SUBCUTANEOUS
  Administered 2016-07-10: 5 [IU] via SUBCUTANEOUS
  Administered 2016-07-10: 2 [IU] via SUBCUTANEOUS
  Administered 2016-07-11: 8 [IU] via SUBCUTANEOUS
  Administered 2016-07-11 (×2): 5 [IU] via SUBCUTANEOUS
  Administered 2016-07-12: 8 [IU] via SUBCUTANEOUS
  Administered 2016-07-12: 3 [IU] via SUBCUTANEOUS

## 2016-07-09 MED ORDER — INSULIN ASPART 100 UNIT/ML ~~LOC~~ SOLN
0.0000 [IU] | Freq: Every day | SUBCUTANEOUS | Status: DC
Start: 2016-07-09 — End: 2016-07-12
  Administered 2016-07-09 – 2016-07-11 (×2): 3 [IU] via SUBCUTANEOUS

## 2016-07-09 MED ORDER — ALUM & MAG HYDROXIDE-SIMETH 200-200-20 MG/5ML PO SUSP
30.0000 mL | ORAL | Status: DC | PRN
  Filled 2016-07-09: qty 30

## 2016-07-09 MED ORDER — SODIUM CHLORIDE 0.9% FLUSH
3.0000 mL | Freq: Two times a day (BID) | INTRAVENOUS | Status: DC
  Administered 2016-07-09 – 2016-07-12 (×2): 3 mL via INTRAVENOUS

## 2016-07-09 MED ORDER — SODIUM CHLORIDE 0.9 % IV SOLN: 250.0000 mL | INTRAVENOUS | Status: DC | PRN

## 2016-07-09 MED ORDER — LOSARTAN POTASSIUM-HCTZ 100-12.5 MG PO TABS: 1.0000 | ORAL_TABLET | Freq: Every day | ORAL | Status: DC

## 2016-07-09 MED ORDER — FUROSEMIDE 10 MG/ML IJ SOLN
60.0000 mg | Freq: Once | INTRAMUSCULAR | Status: AC
  Administered 2016-07-09: 60 mg via INTRAVENOUS
  Filled 2016-07-09: qty 6

## 2016-07-09 NOTE — ED Notes (Signed)
Report given to Lysbeth Galas, RN at this time for room 209
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
## 2016-07-10 ENCOUNTER — Inpatient Hospital Stay (HOSPITAL_COMMUNITY): Payer: Self-pay

## 2016-07-10 DIAGNOSIS — R7989 Other specified abnormal findings of blood chemistry: Secondary | ICD-10-CM

## 2016-07-10 DIAGNOSIS — I509 Heart failure, unspecified: Secondary | ICD-10-CM

## 2016-07-10 DIAGNOSIS — E1159 Type 2 diabetes mellitus with other circulatory complications: Secondary | ICD-10-CM

## 2016-07-10 LAB — ECHOCARDIOGRAM COMPLETE
AO mean calculated velocity dopler: 176 cm/s
AV Area VTI index: 0.5 cm2/m2
AV Area VTI: 1.12 cm2
AV Area mean vel: 1.05 cm2
AV Mean grad: 14 mmHg
AV Peak grad: 25 mmHg
AV VEL mean LVOT/AV: 0.33
AV area mean vel ind: 0.52 cm2/m2
AV peak Index: 0.55
AV pk vel: 250 cm/s
AV vel: 1.01
Ao pk vel: 0.36 m/s
E decel time: 335 msec
E/e' ratio: 39.24
FS: 32 % (ref 28–44)
Height: 66 in
IVS/LV PW RATIO, ED: 0.97
LA ID, A-P, ES: 48 mm
LA diam end sys: 48 mm
LA diam index: 2.36 cm/m2
LA vol A4C: 102 ml
LA vol index: 54 mL/m2
LA vol: 110 mL
LV E/e' medial: 39.24
LV E/e'average: 39.24
LV PW d: 15.5 mm — AB (ref 0.6–1.1)
LV dias vol index: 53 mL/m2
LV dias vol: 107 mL — AB (ref 46–106)
LV e' LATERAL: 3.44 cm/s
LV sys vol index: 22 mL/m2
LV sys vol: 46 mL — AB (ref 14–42)
LVOT SV: 55 mL
LVOT VTI: 17.5 cm
LVOT area: 3.14 cm2
LVOT diameter: 20 mm
LVOT peak VTI: 0.32 cm
LVOT peak grad rest: 3 mmHg
LVOT peak vel: 89.4 cm/s
Lateral S' vel: 8.86 cm/s
MV Dec: 335
MV Peak grad: 7 mmHg
MV pk A vel: 58.7 m/s
MV pk E vel: 135 m/s
RV sys press: 24 mmHg
Reg peak vel: 152 cm/s
Simpson's disk: 58
Stroke v: 62 ml
TAPSE: 21.4 mm
TDI e' lateral: 3.44
TDI e' medial: 3.71
TR max vel: 152 cm/s
VTI: 54.2 cm
Valve area index: 0.5
Valve area: 1.01 cm2
Weight: 3058.22 oz

## 2016-07-10 LAB — GLUCOSE, CAPILLARY
Glucose-Capillary: 337 mg/dL — ABNORMAL HIGH (ref 65–99)
Glucose-Capillary: 144 mg/dL — ABNORMAL HIGH (ref 65–99)
Glucose-Capillary: 242 mg/dL — ABNORMAL HIGH (ref 65–99)
Glucose-Capillary: 191 mg/dL — ABNORMAL HIGH (ref 65–99)

## 2016-07-10 LAB — BASIC METABOLIC PANEL
Anion gap: 10 (ref 5–15)
BUN: 16 mg/dL (ref 6–20)
CO2: 28 mmol/L (ref 22–32)
Calcium: 9.4 mg/dL (ref 8.9–10.3)
Chloride: 95 mmol/L — ABNORMAL LOW (ref 101–111)
Creatinine, Ser: 0.75 mg/dL (ref 0.44–1.00)
GFR calc Af Amer: >60 mL/min (ref >60)
GFR calc non Af Amer: >60 mL/min (ref >60)
Glucose, Bld: 314 mg/dL — ABNORMAL HIGH (ref 65–99)
Potassium: 3.1 mmol/L — ABNORMAL LOW (ref 3.5–5.1)
Sodium: 133 mmol/L — ABNORMAL LOW (ref 135–145)

## 2016-07-10 LAB — TROPONIN I
Troponin I: <0.03 ng/mL (ref <0.03)
Troponin I: <0.03 ng/mL (ref <0.03)

## 2016-07-10 MED ORDER — INSULIN GLARGINE 100 UNIT/ML ~~LOC~~ SOLN
17.0000 [IU] | Freq: Every day | SUBCUTANEOUS | Status: DC
  Administered 2016-07-10 – 2016-07-11 (×2): 17 [IU] via SUBCUTANEOUS
  Filled 2016-07-10 (×3): qty 0.17

## 2016-07-10 MED ORDER — POTASSIUM CHLORIDE CRYS ER 20 MEQ PO TBCR
40.0000 meq | EXTENDED_RELEASE_TABLET | Freq: Two times a day (BID) | ORAL | Status: DC
Start: 2016-07-10 — End: 2016-07-11
  Administered 2016-07-10 – 2016-07-11 (×3): 40 meq via ORAL
  Filled 2016-07-10 (×3): qty 2

## 2016-07-10 NOTE — Progress Notes (Signed)
*  PRELIMINARY RESULTS* Echocardiogram 2D Echocardiogram has been performed.  Robin [name] 07/10/2016, 3:55 PM

## 2016-07-10 NOTE — Care Management Note (Signed)
Case Management Note  Patient Details  Name: Marchia Rober Buttacavoli MRN: [id number] Date of Birth: November 20, 1965  Subjective/Objective:                  Pt admitted with HTN emergency. Pt is from home and ind with ADL's. She is employed but uninsured. She was [facility] at the [facility] or [facility]. She was non-compliant and was DC'd from free clinic, she is eligible to come back in January 2018. Vincente Liberty (clinic coordinator) has been left VM to determine if this is correct. Anticipate pt will DC on meds from $4 list.  Action/Plan: Will cont to follow for DC planning. Pt plans to return home with self care. Pt would like to get back into [facility] in January. Pt may need med assist if meds not on $4 list.   Expected Discharge Date:    07/12/2016              Expected Discharge Plan:  Home/Self Care  In-House Referral:  NA  Discharge planning Services  CM Consult, [facility], Medication Assistance  Status of Service:  In process, will continue to follow  Sherald Barge, RN 07/10/2016, 3:42 PM

## 2016-07-10 NOTE — Progress Notes (Signed)
Inpatient Diabetes Program Recommendations  AACE/ADA: New Consensus Statement on Inpatient Glycemic Control (2015)  Target Ranges:  Prepandial:   less than 140 mg/dL      Peak postprandial:   less than 180 mg/dL (1-2 hours)      Critically ill patients:  140 - 180 mg/dL   Results for RUTHAN, HEITZENRATER (MRN [id number]) as of 07/10/2016 09:32  Ref. Range 07/09/2016 18:35 07/09/2016 21:10 07/10/2016 07:42  Glucose-Capillary Latest Ref Range: 65 - 99 mg/dL 282 (H) 267 (H) 337 (H)   Review of Glycemic Control  Diabetes history: DM2 Outpatient Diabetes medications: Lantus 20 units QHS, Janumet 50-1000 mg BID Current orders for Inpatient glycemic control: Novolog 0-15 units TID with meals, Novolog 0-5 units QHS  Inpatient Diabetes Program Recommendations: Insulin - Basal: Please consider ordering Lantus 17 units Q24H starting now (based on 86 kg x 0.2 units).  Thanks, Barnie Alderman, RN, MSN, CDE Diabetes Coordinator Inpatient Diabetes Program [phone] (Team Pager from 8am to 5pm)
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
## 2016-07-11 DIAGNOSIS — R7989 Other specified abnormal findings of blood chemistry: Secondary | ICD-10-CM

## 2016-07-11 DIAGNOSIS — Z794 Long term (current) use of insulin: Secondary | ICD-10-CM

## 2016-07-11 DIAGNOSIS — Z9119 Patient's noncompliance with other medical treatment and regimen: Secondary | ICD-10-CM

## 2016-07-11 DIAGNOSIS — E119 Type 2 diabetes mellitus without complications: Secondary | ICD-10-CM

## 2016-07-11 LAB — GLUCOSE, CAPILLARY
Glucose-Capillary: 228 mg/dL — ABNORMAL HIGH (ref 65–99)
Glucose-Capillary: 264 mg/dL — ABNORMAL HIGH (ref 65–99)
Glucose-Capillary: 228 mg/dL — ABNORMAL HIGH (ref 65–99)
Glucose-Capillary: 296 mg/dL — ABNORMAL HIGH (ref 65–99)

## 2016-07-11 LAB — BASIC METABOLIC PANEL
Anion gap: 8 (ref 5–15)
BUN: 30 mg/dL — ABNORMAL HIGH (ref 6–20)
CO2: 27 mmol/L (ref 22–32)
Calcium: 9.1 mg/dL (ref 8.9–10.3)
Chloride: 100 mmol/L — ABNORMAL LOW (ref 101–111)
Creatinine, Ser: 1.13 mg/dL — ABNORMAL HIGH (ref 0.44–1.00)
GFR calc Af Amer: >60 mL/min (ref >60)
GFR calc non Af Amer: 56 mL/min — ABNORMAL LOW (ref >60)
Glucose, Bld: 234 mg/dL — ABNORMAL HIGH (ref 65–99)
Potassium: 3.6 mmol/L (ref 3.5–5.1)
Sodium: 135 mmol/L (ref 135–145)

## 2016-07-11 LAB — URINALYSIS, ROUTINE W REFLEX MICROSCOPIC
Bilirubin Urine: NEGATIVE
Glucose, UA: NEGATIVE mg/dL
Hgb urine dipstick: NEGATIVE
Ketones, ur: NEGATIVE mg/dL
Leukocytes, UA: NEGATIVE
Nitrite: NEGATIVE
Protein, ur: NEGATIVE mg/dL
Specific Gravity, Urine: 1.01 (ref 1.005–1.030)
pH: 6.5 (ref 5.0–8.0)

## 2016-07-11 LAB — HEMOGLOBIN A1C
Hgb A1c MFr Bld: 8.5 % — ABNORMAL HIGH (ref 4.8–5.6)
Mean Plasma Glucose: 197 mg/dL

## 2016-07-11 MED ORDER — POTASSIUM CHLORIDE CRYS ER 20 MEQ PO TBCR
40.0000 meq | EXTENDED_RELEASE_TABLET | Freq: Two times a day (BID) | ORAL | Status: AC
  Administered 2016-07-11 – 2016-07-12 (×2): 40 meq via ORAL
  Filled 2016-07-11 (×2): qty 2

## 2016-07-11 MED ORDER — PNEUMOCOCCAL VAC POLYVALENT 25 MCG/0.5ML IJ INJ
0.5000 mL | INJECTION | INTRAMUSCULAR | Status: AC
  Administered 2016-07-12: 0.5 mL via INTRAMUSCULAR
  Filled 2016-07-11: qty 0.5

## 2016-07-11 MED ORDER — PANTOPRAZOLE SODIUM 20 MG PO TBEC
20.0000 mg | DELAYED_RELEASE_TABLET | Freq: Every day | ORAL | Status: DC
  Filled 2016-07-11 (×2): qty 1

## 2016-07-11 MED ORDER — INFLUENZA VAC SPLIT QUAD 0.5 ML IM SUSY
0.5000 mL | PREFILLED_SYRINGE | INTRAMUSCULAR | Status: AC
  Administered 2016-07-12: 0.5 mL via INTRAMUSCULAR
  Filled 2016-07-11: qty 0.5

## 2016-07-11 MED ORDER — PANTOPRAZOLE SODIUM 40 MG PO TBEC
40.0000 mg | DELAYED_RELEASE_TABLET | Freq: Every day | ORAL | Status: DC
  Administered 2016-07-11 – 2016-07-12 (×2): 40 mg via ORAL
  Filled 2016-07-11 (×2): qty 1

## 2016-07-11 NOTE — Progress Notes (Addendum)
Inpatient Diabetes Program Recommendations  AACE/ADA: New Consensus Statement on Inpatient Glycemic Control (2015)  Target Ranges:  Prepandial:   less than 140 mg/dL      Peak postprandial:   less than 180 mg/dL (1-2 hours)      Critically ill patients:  140 - 180 mg/dL   Results for TAILER, WAGENER (MRN [id number]) as of 07/11/2016 09:12  Ref. Range 07/10/2016 07:42 07/10/2016 11:34 07/10/2016 16:19 07/10/2016 21:26 07/11/2016 08:00  Glucose-Capillary Latest Ref Range: 65 - 99 mg/dL 337 (H) 144 (H) 242 (H) 191 (H) 228 (H)   Review of Glycemic Control  Diabetes history: DM2 Outpatient Diabetes medications: Lantus 20 units QHS, Janumet 50-1000 mg BID Current orders for Inpatient glycemic control:Lantus 17 units daily, Novolog 0-15 units TID with meals, Novolog 0-5 units QHS, Novolog 4 units TID with meals  Inpatient Diabetes Program Recommendations: Insulin - Basal: Please consider increasing Lantus to 20 units daily starting now. Patient has already received Lantus 17 units today. Therefore, if Lantus increased as recommended, please order one time dose of Lantus 3 units x 1 now (for total of 20 units today).   Thanks, Barnie Alderman, RN, MSN, CDE Diabetes Coordinator Inpatient Diabetes Program [phone] (Team Pager from 8am to 5pm)
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
## 2016-07-12 DIAGNOSIS — E876 Hypokalemia: Secondary | ICD-10-CM

## 2016-07-12 DIAGNOSIS — E0865 Diabetes mellitus due to underlying condition with hyperglycemia: Secondary | ICD-10-CM

## 2016-07-12 DIAGNOSIS — J810 Acute pulmonary edema: Secondary | ICD-10-CM

## 2016-07-12 DIAGNOSIS — I161 Hypertensive emergency: Secondary | ICD-10-CM

## 2016-07-12 DIAGNOSIS — I5031 Acute diastolic (congestive) heart failure: Secondary | ICD-10-CM

## 2016-07-12 LAB — BASIC METABOLIC PANEL
Anion gap: 7 (ref 5–15)
BUN: 21 mg/dL — ABNORMAL HIGH (ref 6–20)
CO2: 27 mmol/L (ref 22–32)
Calcium: 9.4 mg/dL (ref 8.9–10.3)
Chloride: 103 mmol/L (ref 101–111)
Creatinine, Ser: 0.75 mg/dL (ref 0.44–1.00)
GFR calc Af Amer: >60 mL/min (ref >60)
GFR calc non Af Amer: >60 mL/min (ref >60)
Glucose, Bld: 180 mg/dL — ABNORMAL HIGH (ref 65–99)
Potassium: 3.9 mmol/L (ref 3.5–5.1)
Sodium: 137 mmol/L (ref 135–145)

## 2016-07-12 LAB — HEPATIC FUNCTION PANEL
ALT: 22 U/L (ref 14–54)
AST: 18 U/L (ref 15–41)
Albumin: 3.7 g/dL (ref 3.5–5.0)
Alkaline Phosphatase: 95 U/L (ref 38–126)
Bilirubin, Direct: 0.1 mg/dL (ref 0.1–0.5)
Indirect Bilirubin: 0.4 mg/dL (ref 0.3–0.9)
Total Bilirubin: 0.5 mg/dL (ref 0.3–1.2)
Total Protein: 6.9 g/dL (ref 6.5–8.1)

## 2016-07-12 LAB — GLUCOSE, CAPILLARY
Glucose-Capillary: 253 mg/dL — ABNORMAL HIGH (ref 65–99)
Glucose-Capillary: 155 mg/dL — ABNORMAL HIGH (ref 65–99)

## 2016-07-12 LAB — MAGNESIUM: Magnesium: 1.7 mg/dL (ref 1.7–2.4)

## 2016-07-12 LAB — TSH: TSH: 3.869 u[IU]/mL (ref 0.350–4.500)

## 2016-07-12 MED ORDER — HYDROCHLOROTHIAZIDE 25 MG PO TABS
25.0000 mg | ORAL_TABLET | Freq: Every day | ORAL | Status: DC
Start: 2016-07-12 — End: 2016-07-12
  Administered 2016-07-12: 25 mg via ORAL
  Filled 2016-07-12: qty 1

## 2016-07-12 MED ORDER — ASPIRIN EC 81 MG PO TBEC: 81.0000 mg | DELAYED_RELEASE_TABLET | Freq: Every day | ORAL | 0 refills | Status: DC

## 2016-07-12 MED ORDER — INSULIN ASPART PROT & ASPART (70-30 MIX) 100 UNIT/ML ~~LOC~~ SUSP: 15.0000 [IU] | Freq: Two times a day (BID) | SUBCUTANEOUS | 11 refills | Status: DC

## 2016-07-12 MED ORDER — BLOOD GLUCOSE MONITOR KIT: PACK | 0 refills | Status: DC

## 2016-07-12 MED ORDER — "INSULIN SYRINGE 28G X 1/2"" 1 ML MISC": 1.0000 "application " | Freq: Two times a day (BID) | 0 refills | Status: DC

## 2016-07-12 MED ORDER — LISINOPRIL-HYDROCHLOROTHIAZIDE 20-12.5 MG PO TABS: 2.0000 | ORAL_TABLET | Freq: Every day | ORAL | 0 refills | Status: DC

## 2016-07-12 MED ORDER — INSULIN NPH ISOPHANE & REGULAR (70-30) 100 UNIT/ML ~~LOC~~ SUSP: 15.0000 [IU] | Freq: Two times a day (BID) | SUBCUTANEOUS | 11 refills | Status: DC

## 2016-07-12 MED ORDER — METOPROLOL TARTRATE 50 MG PO TABS: 50.0000 mg | ORAL_TABLET | Freq: Two times a day (BID) | ORAL | 0 refills | Status: DC

## 2016-07-12 MED ORDER — INSULIN ASPART PROT & ASPART (70-30 MIX) 100 UNIT/ML ~~LOC~~ SUSP
15.0000 [IU] | Freq: Two times a day (BID) | SUBCUTANEOUS | Status: DC
  Administered 2016-07-12: 15 [IU] via SUBCUTANEOUS
  Filled 2016-07-12: qty 10

## 2016-07-12 MED ORDER — METFORMIN HCL 1000 MG PO TABS: 1000.0000 mg | ORAL_TABLET | Freq: Two times a day (BID) | ORAL | 0 refills | Status: DC

## 2016-07-12 MED ORDER — ALCOHOL PADS 70 % PADS: 1.0000 "application " | MEDICATED_PAD | Freq: Two times a day (BID) | 0 refills | Status: DC

## 2016-07-12 NOTE — Progress Notes (Addendum)
Inpatient Diabetes Program Recommendations  AACE/ADA: New Consensus Statement on Inpatient Glycemic Control (2015)  Target Ranges:  Prepandial:   less than 140 mg/dL      Peak postprandial:   less than 180 mg/dL (1-2 hours)      Critically ill patients:  140 - 180 mg/dL  Results for Robin [name], Robin [name] (MRN [id number]) as of 07/12/2016 07:34  Ref. Range 07/12/2016 04:55  Glucose Latest Ref Range: 65 - 99 mg/dL 180 (H)   Results for Robin [name], Robin [name] (MRN [id number]) as of 07/12/2016 07:34  Ref. Range 07/11/2016 08:00 07/11/2016 11:47 07/11/2016 16:31 07/11/2016 21:19  Glucose-Capillary Latest Ref Range: 65 - 99 mg/dL 228 (H) 264 (H) 228 (H) 296 (H)    Review of Glycemic Control  Diabetes history: DM2 Outpatient Diabetes medications: Lantus 20 units QHS, Janumet 50-1000 mg BID Current orders for Inpatient glycemic control:Lantus 17 units daily, Novolog 0-15 units TID with meals, Novolog 0-5 units QHS, Novolog 4 units TID with meals  Inpatient Diabetes Program Recommendations: Insulin - Basal: Patient has no insurance and will need affordable insulin as outpatient. Please discontinue Lantus and order 70/30 15 units BID (which will provide a total of 21 units for basal and 9 units for meal coverage per day). Insulin - Meal Coverage: If 70/30 is ordered as recommended, please discontinue meal coverage as the 30% of regular in 70/30 insulin is intended to be meal coverage.  NOTE: Patient will need affordable insulin at discharge. Recommend discharging on NOVOLIN 70/30 which can be purchased at [facility] for $25 per vial.  Addendum 07/12/16@9 :45-Spoke with patient about diabetes and home regimen for diabetes control. Patient reports that she was going to the free clinic in [place] but she is not able to go back to them until after January 7th. Patient plans to call free clinic the first week of January to get an appointment to re-establish care with them. Patient reports that when she was going to the free  clinic they were providing her with Lantus and Janumet to take free of charge. Patient has no insurance and will need affordable insulin regimen until she can get re-established with the free clinic. Therefore, will recommend NOVOLIN 70/30 which can be purchased at [facility] for $25 per vial. Provided handout information on Reli-On products at [facility] and informed patient about NOVOLIN 70/30.  Patient states that she does not have a glucometer or testing supplies at home. Informed patient she could get the Reli-On Prime glucometer at [facility] for $9 and a box of 50 test strips for $9.  Discussed glucose and A1C goals. Discussed importance of checking CBGs and maintaining good CBG control to prevent long-term and short-term complications. Patient states that she is going to get back on track with taking care of her self and her DM.  Patient verbalized understanding of information discussed and she states that she has no further questions at this time related to diabetes.  Thanks, Barnie Alderman, RN, MSN, CDE Diabetes Coordinator Inpatient Diabetes Program [phone] (Team Pager from 8am to 5pm)
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
## 2016-07-12 NOTE — Care Management Note (Signed)
Case Management Note  Patient Details  Name: Kaileen Buntain Ditto MRN: [id number] Date of Birth: 14-Oct-1965  Expected Discharge Date:       07/12/2016           Expected Discharge Plan:  Home/Self Care  In-House Referral:  NA  Discharge planning Services  CM Consult, [facility], Medication Assistance  Post Acute Care Choice:  NA Choice offered to:  NA  Status of Service:  Completed, signed off   Additional Comments: Pt discharging home today. [name] medications will be on [facility] $4 list and insulin will be [facility]'s $25. Pt will have to pay OOP for glucose meter, she is aware. Pt will make appointment at the [facility] after January 1st. CM has verified that pt is eligible to re-enroll after the new year.  Sherald Barge, RN 07/12/2016, 11:23 AM

## 2016-07-13 LAB — URINE CULTURE

## 2016-08-13 ENCOUNTER — Emergency Department (HOSPITAL_COMMUNITY): Payer: Self-pay

## 2016-08-13 ENCOUNTER — Emergency Department (HOSPITAL_COMMUNITY)
Admission: EM | Admit: 2016-08-13 | Discharge: 2016-08-13 | Disposition: A | Discharge status: Home / Self Care | Payer: Self-pay | Attending: Emergency Medicine | Admitting: Emergency Medicine

## 2016-08-13 ENCOUNTER — Encounter (HOSPITAL_COMMUNITY): Payer: Self-pay

## 2016-08-13 DIAGNOSIS — Z7982 Long term (current) use of aspirin: Secondary | ICD-10-CM | POA: Insufficient documentation

## 2016-08-13 DIAGNOSIS — Z794 Long term (current) use of insulin: Secondary | ICD-10-CM | POA: Insufficient documentation

## 2016-08-13 DIAGNOSIS — R509 Fever, unspecified: Secondary | ICD-10-CM | POA: Insufficient documentation

## 2016-08-13 DIAGNOSIS — Z79899 Other long term (current) drug therapy: Secondary | ICD-10-CM | POA: Insufficient documentation

## 2016-08-13 DIAGNOSIS — R52 Pain, unspecified: Secondary | ICD-10-CM | POA: Insufficient documentation

## 2016-08-13 DIAGNOSIS — R69 Illness, unspecified: Secondary | ICD-10-CM

## 2016-08-13 DIAGNOSIS — F17210 Nicotine dependence, cigarettes, uncomplicated: Secondary | ICD-10-CM | POA: Insufficient documentation

## 2016-08-13 DIAGNOSIS — I110 Hypertensive heart disease with heart failure: Secondary | ICD-10-CM | POA: Insufficient documentation

## 2016-08-13 DIAGNOSIS — J111 Influenza due to unidentified influenza virus with other respiratory manifestations: Secondary | ICD-10-CM

## 2016-08-13 DIAGNOSIS — J449 Chronic obstructive pulmonary disease, unspecified: Secondary | ICD-10-CM | POA: Insufficient documentation

## 2016-08-13 DIAGNOSIS — J029 Acute pharyngitis, unspecified: Secondary | ICD-10-CM | POA: Insufficient documentation

## 2016-08-13 DIAGNOSIS — E119 Type 2 diabetes mellitus without complications: Secondary | ICD-10-CM | POA: Insufficient documentation

## 2016-08-13 DIAGNOSIS — I5031 Acute diastolic (congestive) heart failure: Secondary | ICD-10-CM | POA: Insufficient documentation

## 2016-08-13 LAB — URINALYSIS, ROUTINE W REFLEX MICROSCOPIC
Bilirubin Urine: NEGATIVE
Glucose, UA: 150 mg/dL — AB
Hgb urine dipstick: NEGATIVE
Ketones, ur: NEGATIVE mg/dL
Leukocytes, UA: NEGATIVE
Nitrite: NEGATIVE
Protein, ur: NEGATIVE mg/dL
Specific Gravity, Urine: 1.014 (ref 1.005–1.030)
pH: 7 (ref 5.0–8.0)

## 2016-08-13 IMAGING — DX DG CHEST 2V
2 series · 2 of 2 positions shown · non-contrast
Comparison: [DATE]

CLINICAL DATA: Cough, fever, and body aches.

EXAM:
CHEST  2 VIEW

[chest pa]
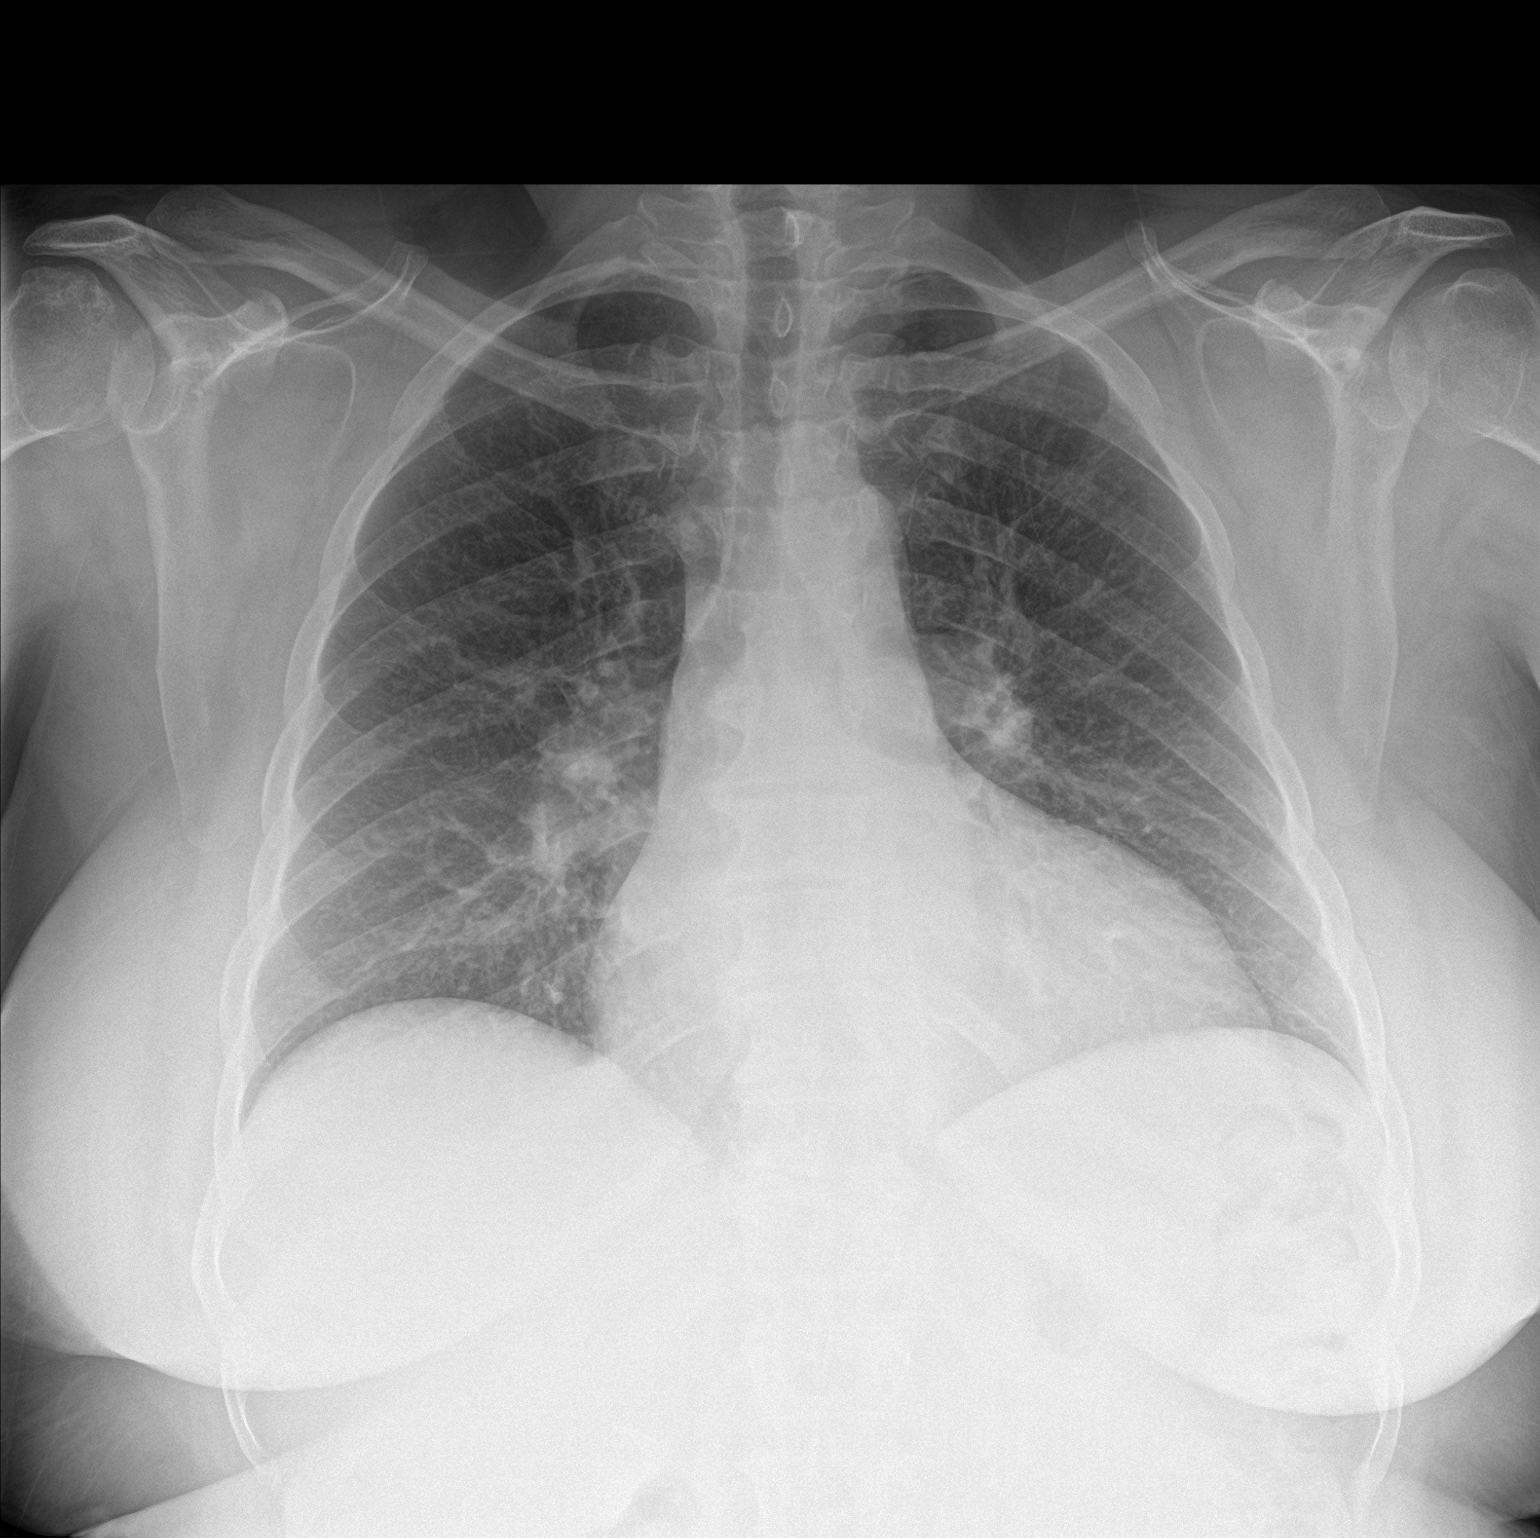

[chest lat]
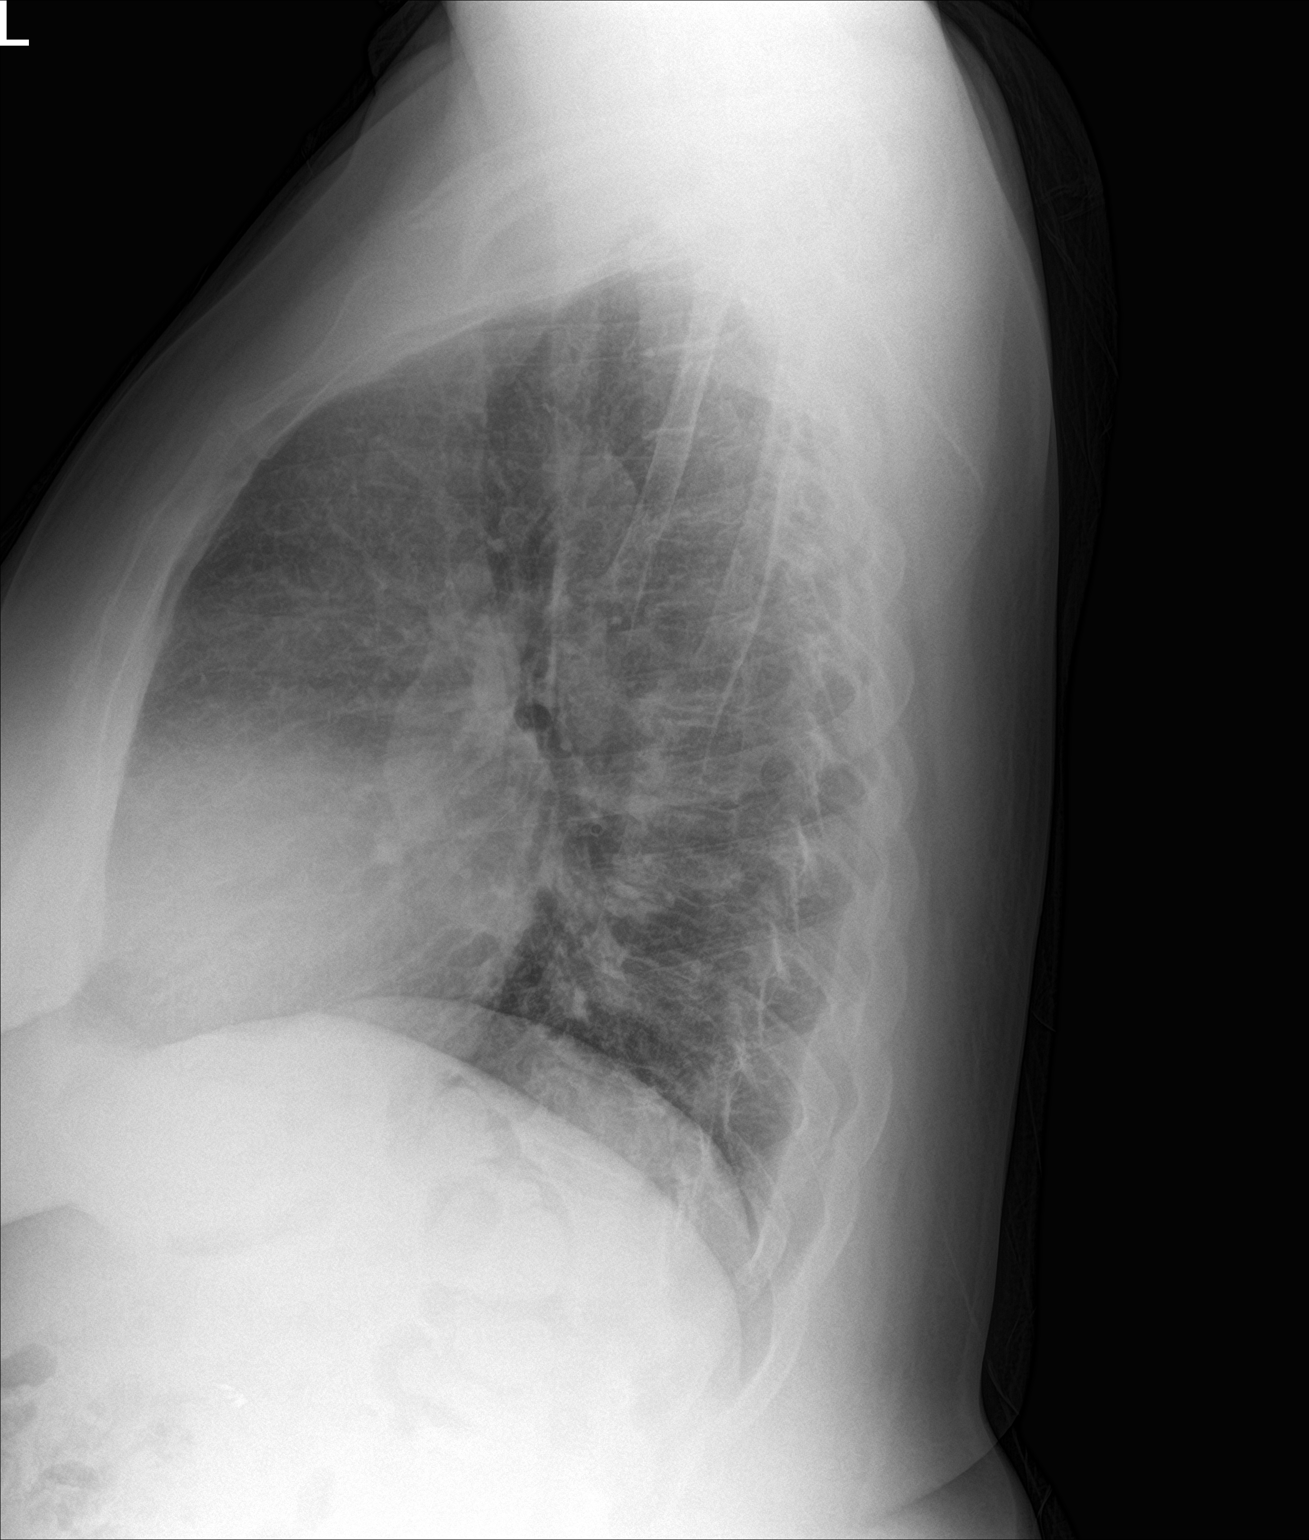

[2 of 2 positions shown; findings below may reference images not displayed]

FINDINGS: The cardiac silhouette is mildly enlarged. Pulmonary vascular
congestion and interstitial densities on the prior study have
resolved. There is no current evidence of airspace consolidation,
edema, pleural effusion, or pneumothorax. Right upper quadrant
abdominal surgical clips are noted. No acute osseous abnormality is
identified.
IMPRESSION: No active cardiopulmonary disease.

## 2016-08-13 MED ORDER — OXYCODONE-ACETAMINOPHEN 5-325 MG PO TABS
1.0000 | ORAL_TABLET | Freq: Once | ORAL | Status: AC
  Administered 2016-08-13: 1 via ORAL
  Filled 2016-08-13: qty 1

## 2016-08-13 MED ORDER — IBUPROFEN 400 MG PO TABS
600.0000 mg | ORAL_TABLET | Freq: Once | ORAL | Status: AC
  Administered 2016-08-13: 600 mg via ORAL
  Filled 2016-08-13: qty 2

## 2016-08-13 NOTE — ED Triage Notes (Signed)
Patient complaining of fever and body aches that started last night.  States that she had a flu shot while in the hospital last month.  I took some alka seltzer cold plus.  Has not been taking ibuprofen or tylenol.

## 2016-08-19 ENCOUNTER — Encounter (HOSPITAL_COMMUNITY): Payer: Self-pay | Admitting: Cardiology

## 2016-08-19 ENCOUNTER — Emergency Department (HOSPITAL_COMMUNITY): Payer: Self-pay

## 2016-08-19 ENCOUNTER — Emergency Department (HOSPITAL_COMMUNITY)
Admission: EM | Admit: 2016-08-19 | Discharge: 2016-08-19 | Disposition: A | Discharge status: Home / Self Care | Payer: Self-pay | Attending: Emergency Medicine | Admitting: Emergency Medicine

## 2016-08-19 DIAGNOSIS — R531 Weakness: Secondary | ICD-10-CM | POA: Insufficient documentation

## 2016-08-19 DIAGNOSIS — Z794 Long term (current) use of insulin: Secondary | ICD-10-CM | POA: Insufficient documentation

## 2016-08-19 DIAGNOSIS — Z79899 Other long term (current) drug therapy: Secondary | ICD-10-CM | POA: Insufficient documentation

## 2016-08-19 DIAGNOSIS — R079 Chest pain, unspecified: Secondary | ICD-10-CM

## 2016-08-19 DIAGNOSIS — R0789 Other chest pain: Secondary | ICD-10-CM | POA: Insufficient documentation

## 2016-08-19 DIAGNOSIS — F17210 Nicotine dependence, cigarettes, uncomplicated: Secondary | ICD-10-CM | POA: Insufficient documentation

## 2016-08-19 DIAGNOSIS — Z7982 Long term (current) use of aspirin: Secondary | ICD-10-CM | POA: Insufficient documentation

## 2016-08-19 DIAGNOSIS — I5031 Acute diastolic (congestive) heart failure: Secondary | ICD-10-CM | POA: Insufficient documentation

## 2016-08-19 DIAGNOSIS — J449 Chronic obstructive pulmonary disease, unspecified: Secondary | ICD-10-CM | POA: Insufficient documentation

## 2016-08-19 DIAGNOSIS — I110 Hypertensive heart disease with heart failure: Secondary | ICD-10-CM | POA: Insufficient documentation

## 2016-08-19 DIAGNOSIS — E119 Type 2 diabetes mellitus without complications: Secondary | ICD-10-CM | POA: Insufficient documentation

## 2016-08-19 DIAGNOSIS — R51 Headache: Secondary | ICD-10-CM | POA: Insufficient documentation

## 2016-08-19 LAB — BASIC METABOLIC PANEL
Anion gap: 6 (ref 5–15)
BUN: 18 mg/dL (ref 6–20)
CO2: 27 mmol/L (ref 22–32)
Calcium: 9.1 mg/dL (ref 8.9–10.3)
Chloride: 103 mmol/L (ref 101–111)
Creatinine, Ser: 0.66 mg/dL (ref 0.44–1.00)
GFR calc Af Amer: >60 mL/min (ref >60)
GFR calc non Af Amer: >60 mL/min (ref >60)
Glucose, Bld: 144 mg/dL — ABNORMAL HIGH (ref 65–99)
Potassium: 3.7 mmol/L (ref 3.5–5.1)
Sodium: 136 mmol/L (ref 135–145)

## 2016-08-19 LAB — CBC
HCT: 41.8 % (ref 36.0–46.0)
Hemoglobin: 14.3 g/dL (ref 12.0–15.0)
MCH: 28.9 pg (ref 26.0–34.0)
MCHC: 34.2 g/dL (ref 30.0–36.0)
MCV: 84.6 fL (ref 78.0–100.0)
Platelets: 217 10*3/uL (ref 150–400)
RBC: 4.94 MIL/uL (ref 3.87–5.11)
RDW: 12.8 % (ref 11.5–15.5)
WBC: 8.6 10*3/uL (ref 4.0–10.5)

## 2016-08-19 LAB — BRAIN NATRIURETIC PEPTIDE: B Natriuretic Peptide: 179 pg/mL — ABNORMAL HIGH (ref 0.0–100.0)

## 2016-08-19 LAB — TROPONIN I: Troponin I: <0.03 ng/mL (ref <0.03)

## 2016-08-19 IMAGING — CT CT HEAD W/O CM
3 series · 16 of 47 positions shown, 19 images · non-contrast
Comparison: [DATE]

CLINICAL DATA: Left-sided headache and dizziness

EXAM:
CT HEAD WITHOUT CONTRAST
TECHNIQUE: Contiguous axial images were obtained from the base of the skull
through the vertex without intravenous contrast.

[Series 2: head wo · axial · 0.41mm/px · z∈[+1175,+1300]mm · 10 of 30 slices shown, 13 images]
[im 3/30  brain]
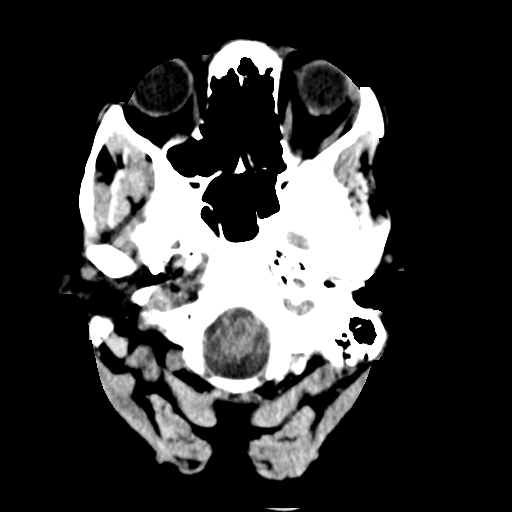
[im 3/30  bone]
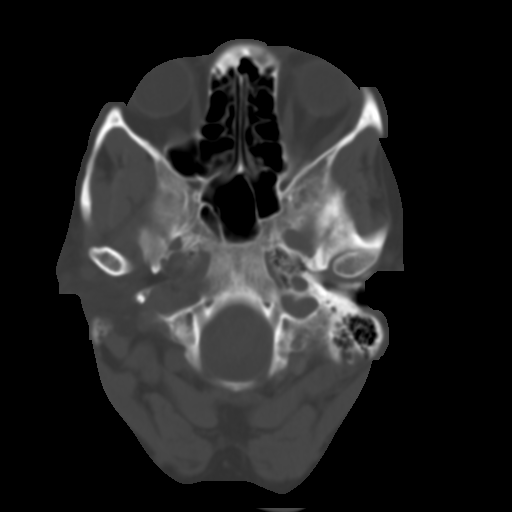
[im 6/30  brain]
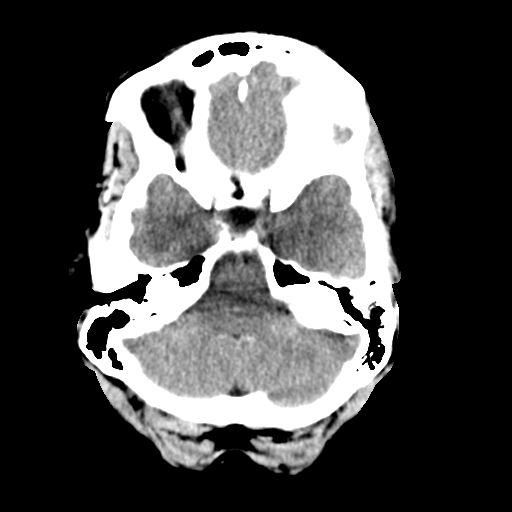
[im 9/30  brain]
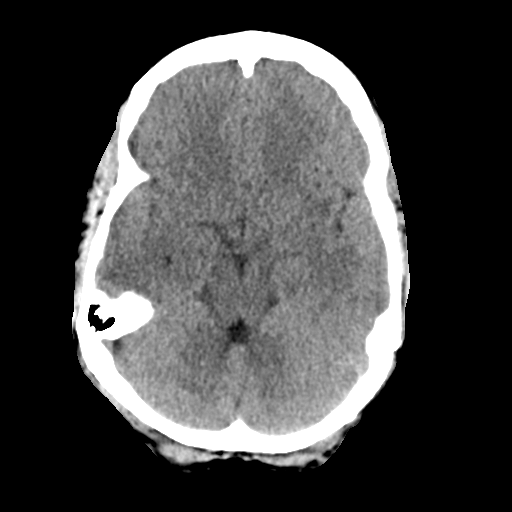
[im 11/30  brain]
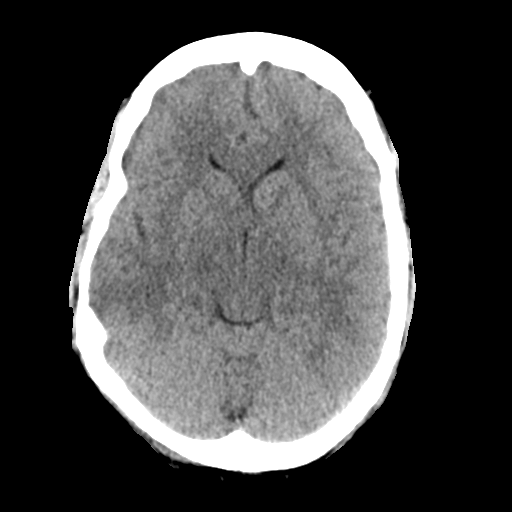
[im 14/30  brain]
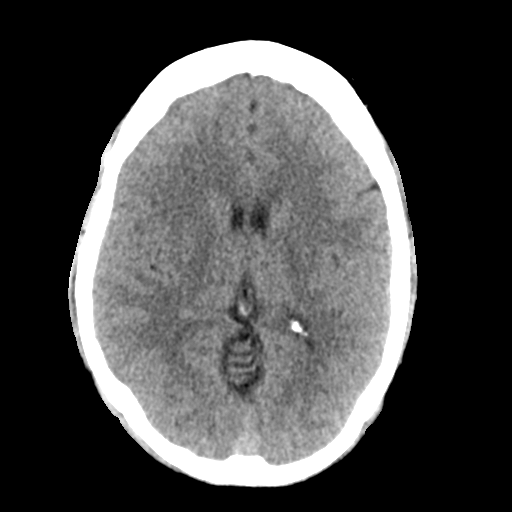
[im 14/30  bone]
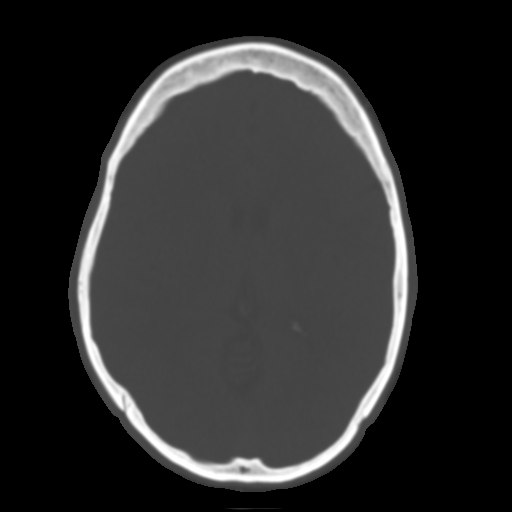
[im 17/30  brain]
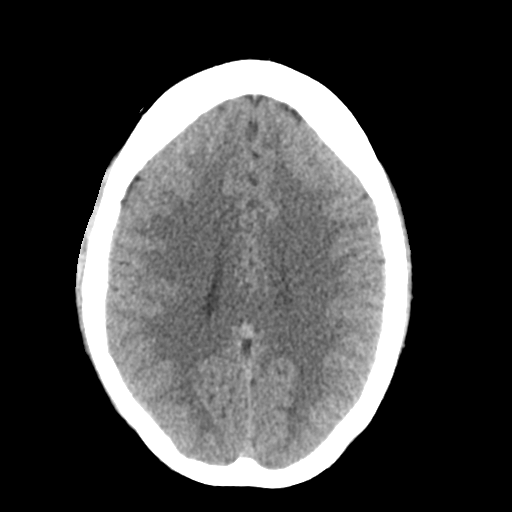
[im 20/30  brain]
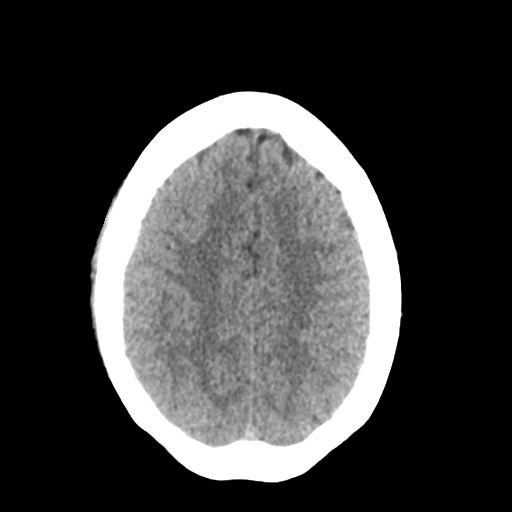
[im 23/30  brain]
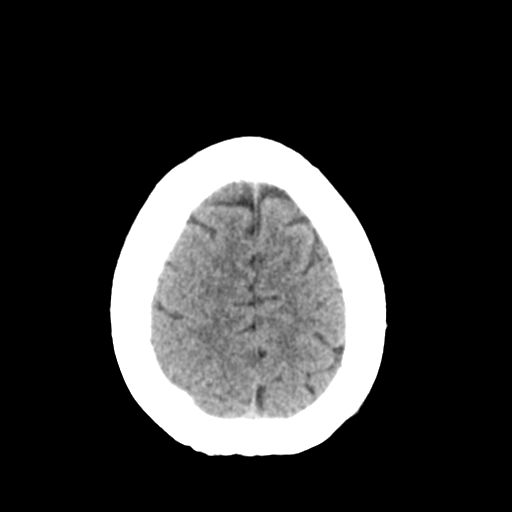
[im 25/30  brain]
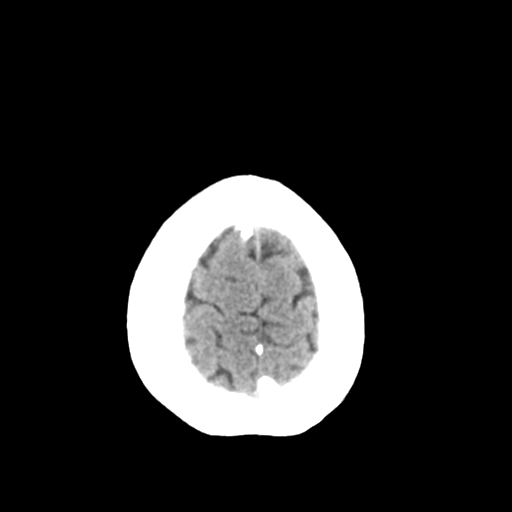
[im 25/30  bone]
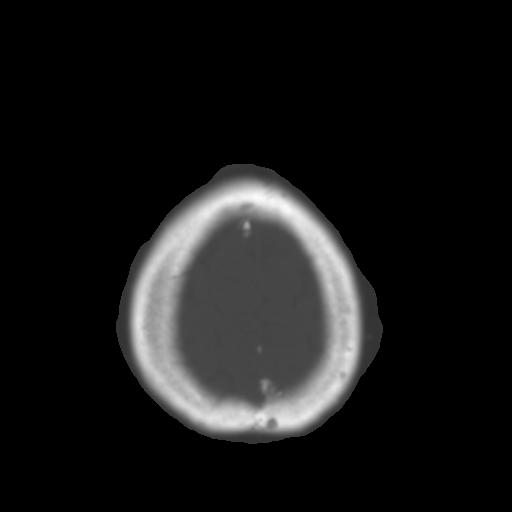
[im 28/30  brain]
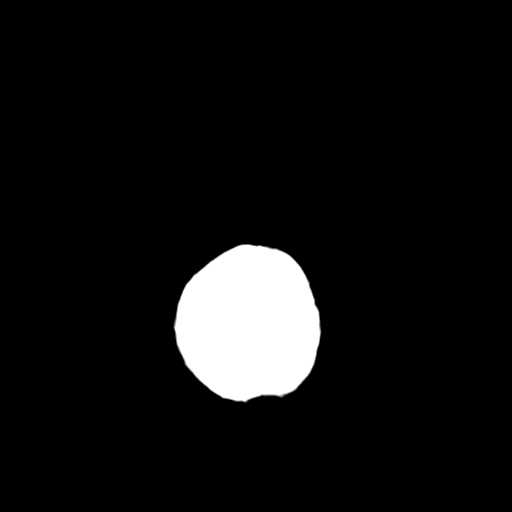

[Series 4: coronal soft tissue · coronal · 0.31mm/px · 3 of 67 slices shown]
[im 23/67  brain]
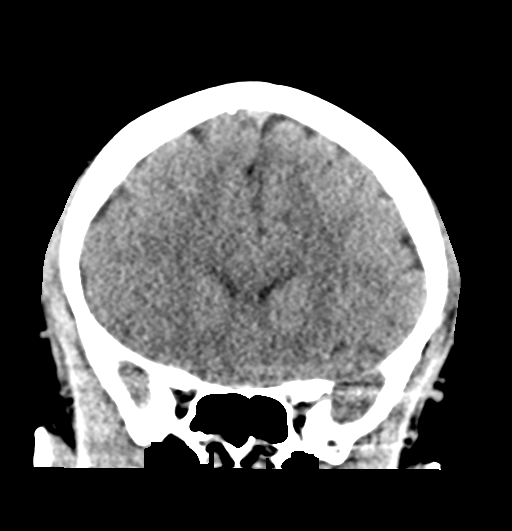
[im 30/67  brain]
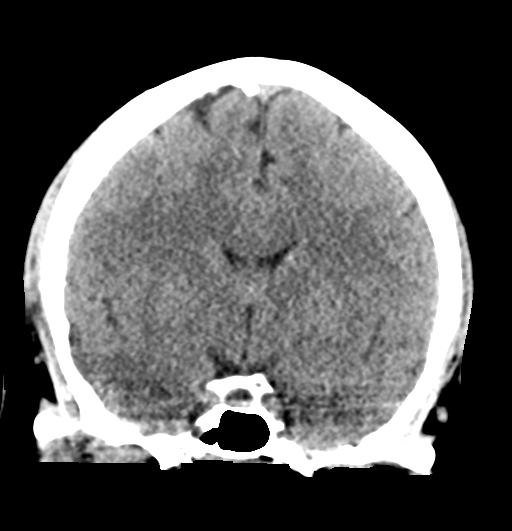
[im 37/67  brain]
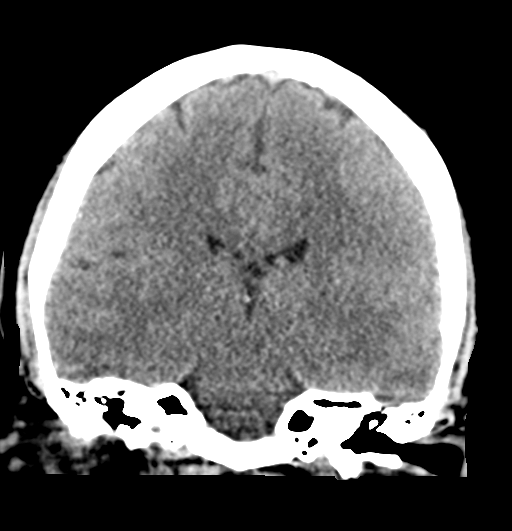

[Series 5: sagittal soft tissue · sagittal · 0.32mm/px · 3 of 53 slices shown]
[im 18/53  brain]
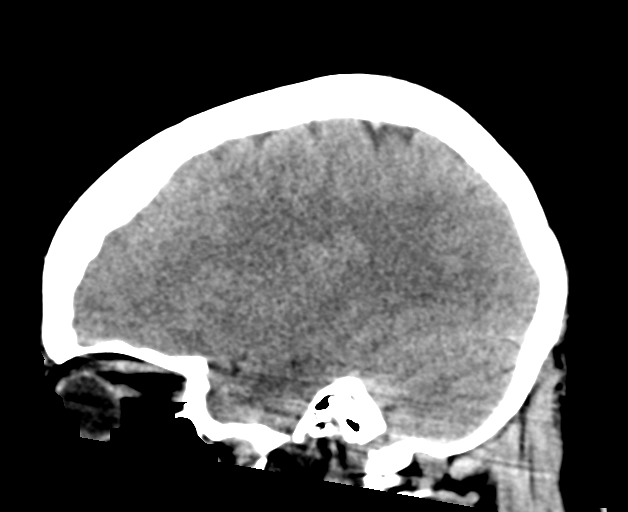
[im 27/53  brain]
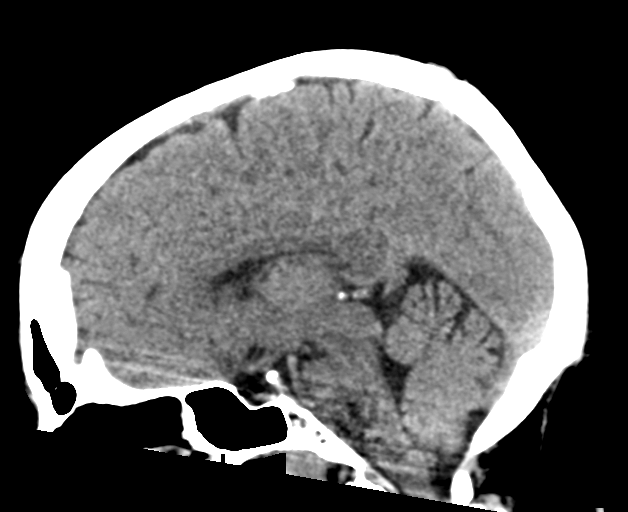
[im 35/53  brain]
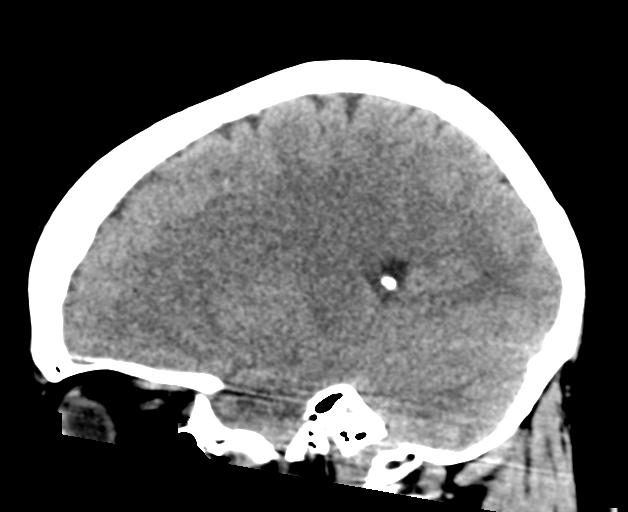

[16 of 47 positions shown; findings below may reference images not displayed]

FINDINGS: Brain: The ventricles are normal in size and configuration. There is
mild invagination of CSF into the sella. There is no intracranial
mass, hemorrhage, extra-axial fluid collection, or midline shift.
Gray-white compartments normal. No acute infarct evident.

Vascular: There is no hyperdense vessel. There is no appreciable
vascular calcification.

Skull: Bony calvarium appears intact.

Sinuses/Orbits: There is mucosal thickening in several ethmoid air
cells bilaterally. There is nasal turbinate edema bilaterally. Left
frontal sinuses are nearly aplastic. Other visualized paranasal
sinuses are clear. Visualized orbits appear symmetric bilaterally.

Other: Mastoid air cells are clear.
IMPRESSION: No intracranial mass, hemorrhage, or extra-axial fluid collection.
No focal gray-white compartment lesions. Mild invagination of CSF
into the sella.

Mucosal thickening in several ethmoid air cells bilaterally. Nasal
turbinate edema bilaterally.

## 2016-08-19 IMAGING — DX DG CHEST 2V
2 series · 2 of 2 positions shown · non-contrast
Comparison: [DATE]

CLINICAL DATA: Chest pain

EXAM:
CHEST  2 VIEW

[chest pa]
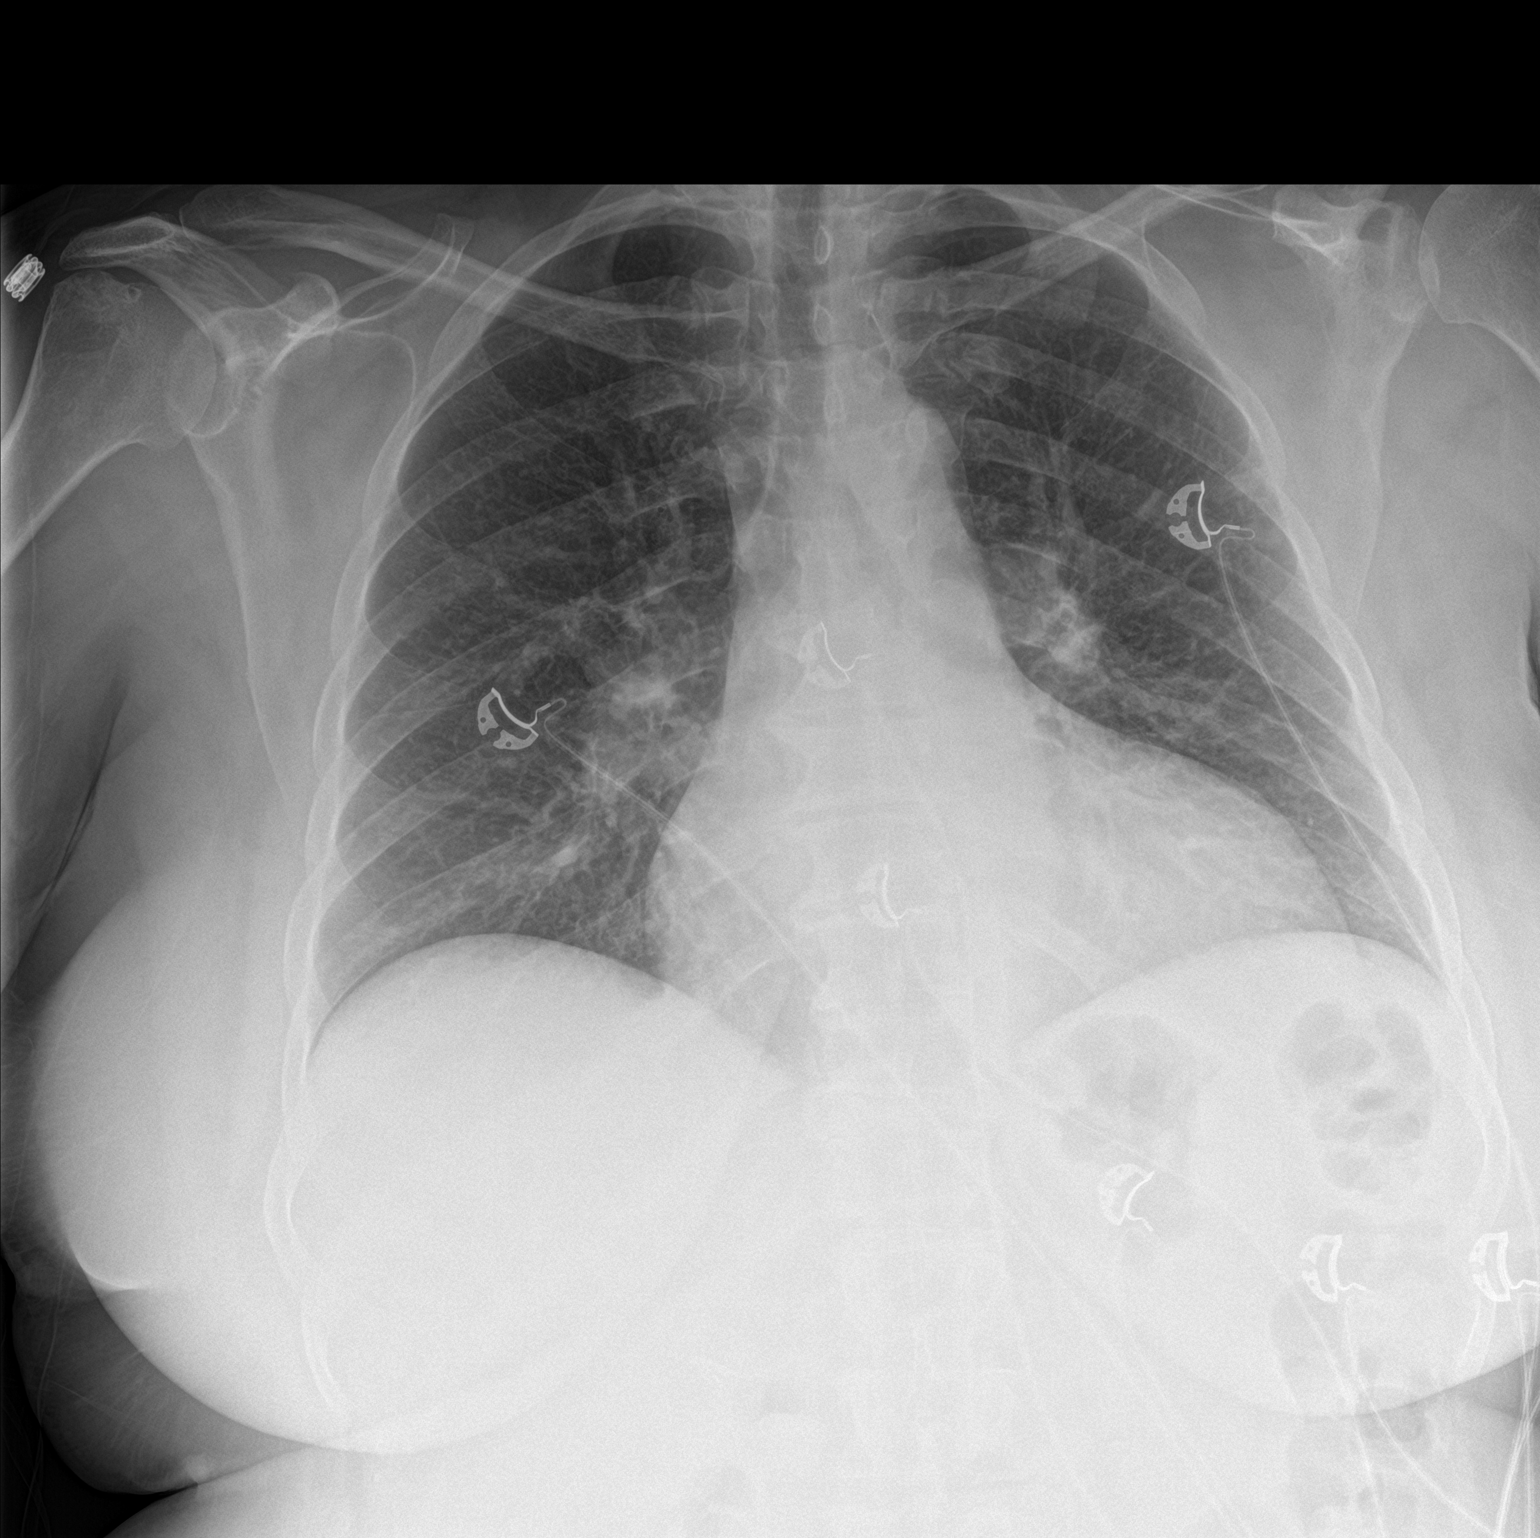

[chest lat]
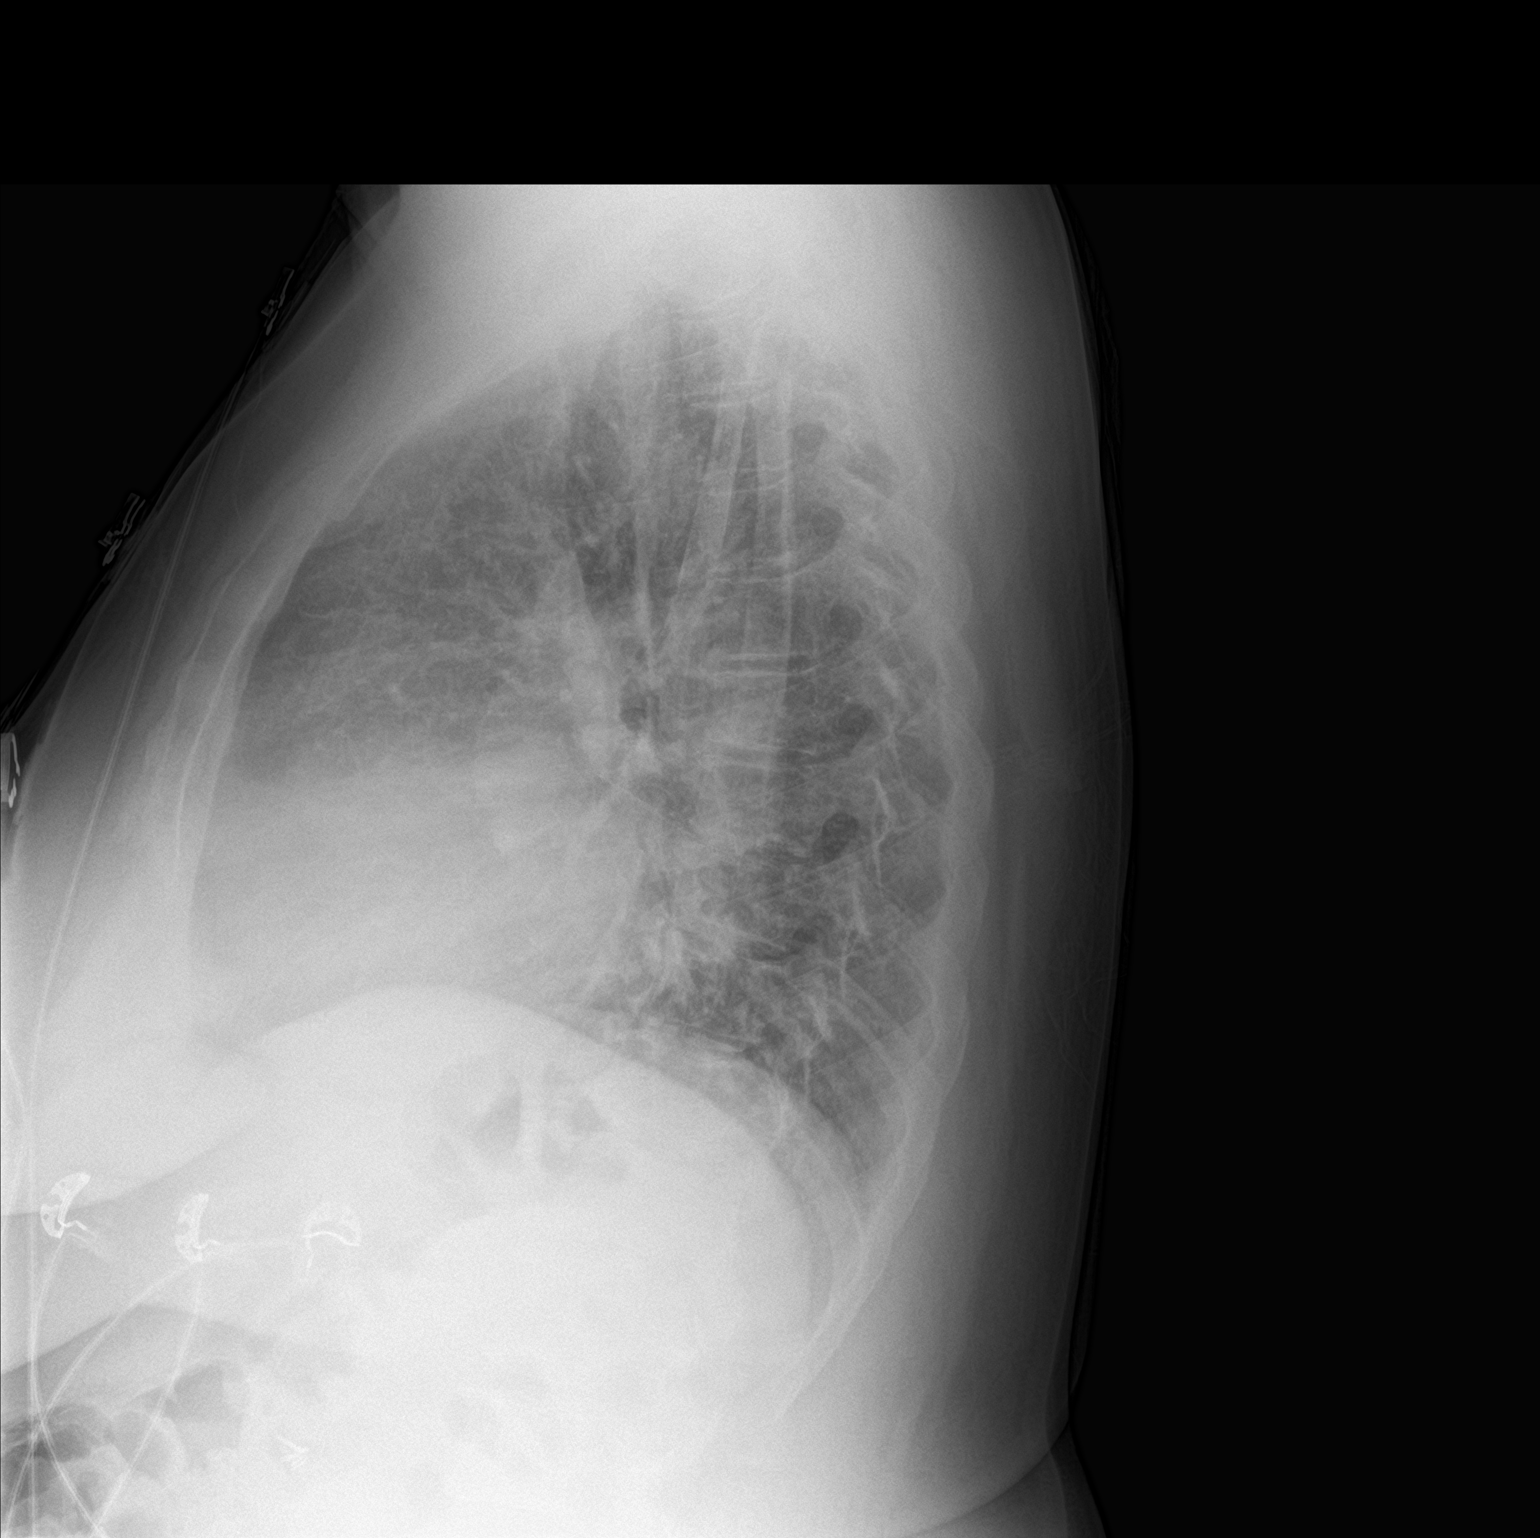

[2 of 2 positions shown; findings below may reference images not displayed]

FINDINGS: There is no edema or consolidation. Heart is mildly enlarged with
pulmonary vascularity within normal limits. No adenopathy. No
pneumothorax. No bone lesions.
IMPRESSION: Mild cardiac prominence, stable.  No edema or consolidation.

## 2016-08-19 MED ORDER — FENTANYL CITRATE (PF) 100 MCG/2ML IJ SOLN
50.0000 ug | Freq: Once | INTRAMUSCULAR | Status: AC
  Administered 2016-08-19: 50 ug via INTRAVENOUS
  Filled 2016-08-19: qty 2

## 2016-08-19 MED ORDER — HYDROCODONE-ACETAMINOPHEN 5-325 MG PO TABS: 1.0000 | ORAL_TABLET | ORAL | 0 refills | Status: DC | PRN

## 2016-08-19 NOTE — ED Notes (Signed)
Discharge instructions reviewed with patient. Patient given Prepack of Hydrocodone 6 tablets with Lura Em, RN as witness

## 2016-08-19 NOTE — ED Triage Notes (Addendum)
Onset of left sided  Headache today 130.  States now the pain has went down into her left side of her chest.  States her left side feels funny and she feels like she is walking to her left side when she ambulates.  EMS gave 4mg  zofran for c/o nausea.
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
## 2016-08-26 MED FILL — Hydrocodone-Acetaminophen Tab 5-325 MG: ORAL | Qty: 6 | Status: AC

## 2016-08-26 NOTE — ED Provider Notes (Signed)
[facility]-EMERGENCY DEPT Provider Note   CSN: [id number] Arrival date & time: 08/13/16  0556     History   Chief Complaint Chief Complaint  Patient presents with  . Generalized Body Aches    HPI Robin [name] is a 51 y.o. female.  HPI   51-year-old female with several complaints. She is primarily complaining of generalized body aches. Onset last night. Persistent since then. Subjective fever. Mild sore throat which has improved since yesterday. Mild headache. Occasional lump,. No unusual leg pain or swelling.  Past Medical History:  Diagnosis Date  . Arthritis   . CHF (congestive heart failure) (HCC)   . Chronic abdominal pain   . COPD (chronic obstructive pulmonary disease) (HCC)   . Diabetes mellitus   . Essential hypertension, benign   . GERD (gastroesophageal reflux disease)   . Gout   . Gout 2016  . Normal coronary arteries    3/10 - following abnormal Myoview  . Ovarian cyst   . Type 2 diabetes mellitus (HCC)     Patient Active Problem List   Diagnosis Date Noted  . Acute diastolic CHF (congestive heart failure) (HCC) 07/12/2016  . Diabetes mellitus due to underlying condition, uncontrolled, with hyperglycemia, without long-term current use of insulin (HCC) 07/12/2016  . Hypokalemia 07/12/2016  . CHF (congestive heart failure) (HCC) 07/09/2016  . Uncontrolled type 2 diabetes mellitus with complication (HCC) 06/07/2015  . Essential hypertension, benign 06/07/2015  . Cigarette nicotine dependence, uncomplicated 06/07/2015  . Obesity, unspecified 06/07/2015  . Abdominal pain 07/03/2011  . Pulmonary edema 06/07/2011    Class: Acute  . Hypertensive emergency 06/07/2011  . Respiratory failure with hypoxia (HCC) 06/07/2011  . GERD (gastroesophageal reflux disease) 06/07/2011  . Hemoptysis 06/07/2011  . COPD (chronic obstructive pulmonary disease) (HCC) 06/07/2011  . Arthritis 06/07/2011  . Nicotine abuse 06/07/2011  . Obesity 06/07/2011    Past Surgical  History:  Procedure Laterality Date  . ABDOMINAL HYSTERECTOMY  09/10/2011   Procedure: HYSTERECTOMY ABDOMINAL;  Surgeon: John V Ferguson, MD;  Location: [facility];  Service: Gynecology;  Laterality: N/A;  Abdominal hysterectomy  . CESAREAN SECTION  1988,1992, and 1994  . CHOLECYSTECTOMY  1995  . SCAR REVISION  09/10/2011   Procedure: SCAR REVISION;  Surgeon: John V Ferguson, MD;  Location: [facility];  Service: Gynecology;  Laterality: N/A;  Wide Excision of old Cicatrix  . TUBAL LIGATION  1994    OB History    Gravida Para Term Preterm AB Living             3   SAB TAB Ectopic Multiple Live Births                   Home Medications    Prior to Admission medications   Medication Sig Start Date End Date Taking? Authorizing Provider  Alcohol Swabs (ALCOHOL PADS) 70 % PADS 1 application by Does not apply route 2 (two) times daily. 07/12/16  Yes Nishant Dhungel, MD  aspirin EC 81 MG tablet Take 1 tablet (81 mg total) by mouth daily. 07/12/16  Yes Nishant Dhungel, MD  blood glucose meter kit and supplies KIT Dispense based on patient and insurance preference. Use up to four times daily as directed. (FOR ICD-9 250.00, 250.01). 07/12/16  Yes Nishant Dhungel, MD  ibuprofen (ADVIL,MOTRIN) 600 MG tablet Take 600 mg by mouth every 6 (six) hours as needed for mild pain or moderate pain.    Yes Historical Provider, MD  insulin NPH-regular Human (NOVOLIN 70/30) (  70-30) 100 UNIT/ML injection Inject 15 Units into the skin 2 (two) times daily with a meal. 07/12/16  Yes Nishant Dhungel, MD  Insulin Syringe-Needle U-100 (INSULIN SYRINGE 1CC/28G) 28G X 1/2" 1 ML MISC 1 application by Does not apply route 2 (two) times daily. 07/12/16  Yes Nishant Dhungel, MD  lisinopril-hydrochlorothiazide (PRINZIDE,ZESTORETIC) 20-12.5 MG tablet Take 2 tablets by mouth daily. 07/12/16  Yes Nishant Dhungel, MD  metFORMIN (GLUCOPHAGE) 1000 MG tablet Take 1 tablet (1,000 mg total) by mouth 2 (two) times daily with a meal. 07/12/16  Yes  Nishant Dhungel, MD  metoprolol (LOPRESSOR) 50 MG tablet Take 1 tablet (50 mg total) by mouth 2 (two) times daily. 07/12/16  Yes Nishant Dhungel, MD  omeprazole (PRILOSEC) 20 MG capsule Take 20 mg by mouth daily.   Yes Historical Provider, MD  HYDROcodone-acetaminophen (NORCO/VICODIN) 5-325 MG tablet Take 1-2 tablets by mouth every 4 (four) hours as needed. 08/19/16   Davonna Belling, MD    Family History Family History  Problem Relation Age of Onset  . Cirrhosis Mother   . Diabetes type II Father   . Diabetes type II Sister   . Anesthesia problems Neg Hx   . Hypotension Neg Hx   . Malignant hyperthermia Neg Hx   . Pseudochol deficiency Neg Hx     Social History Social History  Substance Use Topics  . Smoking status: Current Some Day Smoker    Packs/day: 0.25    Years: 29.00    Types: Cigarettes    Last attempt to quit: 08/31/2015  . Smokeless tobacco: Never Used     Comment: 1 cigarette every 3 days  . Alcohol use No     Allergies   Bee venom; Naproxen; and Penicillins   Review of Systems Review of Systems  All systems reviewed and negative, other than as noted in HPI.   Physical Exam Updated Vital Signs BP 137/78 (BP Location: Left Arm)   Pulse 64   Temp 98.2 F (36.8 C) (Oral)   Resp 20   Ht 5' 6" (1.676 m)   Wt 192 lb (87.1 kg)   LMP 08/21/2011   SpO2 97%   BMI 30.99 kg/m   Physical Exam  Constitutional: She appears well-developed and well-nourished. No distress.  HENT:  Head: Normocephalic and atraumatic.  Eyes: Conjunctivae are normal. Right eye exhibits no discharge. Left eye exhibits no discharge.  Neck: Neck supple.  Cardiovascular: Normal rate, regular rhythm and normal heart sounds.  Exam reveals no gallop and no friction rub.   No murmur heard. Pulmonary/Chest: Effort normal and breath sounds normal. No respiratory distress.  Spacing complete sentences. No increased work of breathing.  Abdominal: Soft. She exhibits no distension. There is no  tenderness.  Musculoskeletal: She exhibits no edema or tenderness.  Neurological: She is alert.  Skin: Skin is warm and dry.  Psychiatric: She has a normal mood and affect. Her behavior is normal. Thought content normal.  Nursing note and vitals reviewed.    ED Treatments / Results  Labs (all labs ordered are listed, but only abnormal results are displayed) Labs Reviewed  URINALYSIS, ROUTINE W REFLEX MICROSCOPIC - Abnormal; Notable for the following:       Result Value   Glucose, UA 150 (*)    All other components within normal limits    EKG  EKG Interpretation None       Radiology No results found.  Procedures Procedures (including critical care time)  Medications Ordered in ED Medications  ibuprofen (  ADVIL,MOTRIN) tablet 600 mg (600 mg Oral Given 08/13/16 0814)  oxyCODONE-acetaminophen (PERCOCET/ROXICET) 5-325 MG per tablet 1 tablet (1 tablet Oral Given 08/13/16 0813)     Initial Impression / Assessment and Plan / ED Course  I have reviewed the triage vital signs and the nursing notes.  Pertinent labs & imaging results that were available during my care of the patient were reviewed by me and considered in my medical decision making (see chart for details).     51 year old female with flulike symptoms.She is afebrile the emergency room she reports a subjective fever at home. She is hemodynamically stable. She is HD stable. Plan symptomatic treatment. Doubt serious bacterial illness or other emergent process  Final Clinical Impressions(s) / ED Diagnoses   Final diagnoses:  Influenza-like illness    New Prescriptions Discharge Medication List as of 08/13/2016  9:07 AM       Virgel Manifold, MD 08/26/16 1354

## 2016-11-17 DIAGNOSIS — J449 Chronic obstructive pulmonary disease, unspecified: Secondary | ICD-10-CM | POA: Insufficient documentation

## 2016-11-17 DIAGNOSIS — E119 Type 2 diabetes mellitus without complications: Secondary | ICD-10-CM | POA: Insufficient documentation

## 2016-11-17 DIAGNOSIS — Z794 Long term (current) use of insulin: Secondary | ICD-10-CM | POA: Insufficient documentation

## 2016-11-17 DIAGNOSIS — Z7982 Long term (current) use of aspirin: Secondary | ICD-10-CM | POA: Insufficient documentation

## 2016-11-17 DIAGNOSIS — I5031 Acute diastolic (congestive) heart failure: Secondary | ICD-10-CM | POA: Insufficient documentation

## 2016-11-17 DIAGNOSIS — I110 Hypertensive heart disease with heart failure: Secondary | ICD-10-CM | POA: Insufficient documentation

## 2016-11-17 DIAGNOSIS — L089 Local infection of the skin and subcutaneous tissue, unspecified: Secondary | ICD-10-CM | POA: Insufficient documentation

## 2016-11-17 DIAGNOSIS — Z87891 Personal history of nicotine dependence: Secondary | ICD-10-CM | POA: Insufficient documentation

## 2016-11-17 DIAGNOSIS — Z79899 Other long term (current) drug therapy: Secondary | ICD-10-CM | POA: Insufficient documentation

## 2016-11-18 ENCOUNTER — Encounter (HOSPITAL_COMMUNITY): Payer: Self-pay | Admitting: *Deleted

## 2016-11-18 ENCOUNTER — Emergency Department (HOSPITAL_COMMUNITY)
Admission: EM | Admit: 2016-11-18 | Discharge: 2016-11-18 | Disposition: A | Discharge status: Home / Self Care | Payer: Self-pay | Attending: Emergency Medicine | Admitting: Emergency Medicine

## 2016-11-18 DIAGNOSIS — L089 Local infection of the skin and subcutaneous tissue, unspecified: Secondary | ICD-10-CM

## 2016-11-18 DIAGNOSIS — Z8639 Personal history of other endocrine, nutritional and metabolic disease: Secondary | ICD-10-CM

## 2016-11-18 DIAGNOSIS — R21 Rash and other nonspecific skin eruption: Secondary | ICD-10-CM

## 2016-11-18 LAB — CBG MONITORING, ED: Glucose-Capillary: 255 mg/dL — ABNORMAL HIGH (ref 65–99)

## 2016-11-18 MED ORDER — SULFAMETHOXAZOLE-TRIMETHOPRIM 800-160 MG PO TABS
1.0000 | ORAL_TABLET | Freq: Once | ORAL | Status: AC
Start: 2016-11-18 — End: 2016-11-18
  Administered 2016-11-18: 1 via ORAL
  Filled 2016-11-18: qty 1

## 2016-11-18 MED ORDER — ACETAMINOPHEN 500 MG PO TABS
1000.0000 mg | ORAL_TABLET | Freq: Once | ORAL | Status: AC
  Administered 2016-11-18: 1000 mg via ORAL
  Filled 2016-11-18: qty 2

## 2016-11-18 MED ORDER — SULFAMETHOXAZOLE-TRIMETHOPRIM 800-160 MG PO TABS: 1.0000 | ORAL_TABLET | Freq: Two times a day (BID) | ORAL | 0 refills | Status: AC

## 2016-11-18 NOTE — ED Notes (Signed)
Pt alert & oriented x4, stable gait. Patient given discharge instructions, paperwork & prescription(s). Patient  instructed to stop at the registration desk to finish any additional paperwork. Patient verbalized understanding. Pt left department w/ no further questions. 

## 2016-11-18 NOTE — ED Provider Notes (Signed)
[facility] DEPT Provider Note   CSN: [id number] Arrival date & time: 11/17/16  2330     History   Chief Complaint Chief Complaint  Patient presents with  . Rash    HPI Robin [name] is a 51 y.o. female.  Patient presents with worsening rash. Patient was seen recently after she had a rash in her groin that has since improved on treatment. Patient has developed since Wednesday multiple small pustules on her posterior thigh area and a few in her groin. No history of MRSA however patient does work in healthcare facility. No fevers chills or vomiting. Patient says her sugars have been overall controlled. Patient is unsure which antibiotic/medication she was recently put on. Patient feels this rash started before then however is worsened.      Past Medical History:  Diagnosis Date  . Arthritis   . CHF (congestive heart failure) ([place])   . Chronic abdominal pain   . COPD (chronic obstructive pulmonary disease) ([place])   . Diabetes mellitus   . Essential hypertension, benign   . GERD (gastroesophageal reflux disease)   . Gout   . Gout 2016  . Normal coronary arteries    3/10 - following abnormal Myoview  . Ovarian cyst   . Type 2 diabetes mellitus [facility])     Patient Active Problem List   Diagnosis Date Noted  . Acute diastolic CHF (congestive heart failure) ([place]) 07/12/2016  . Diabetes mellitus due to underlying condition, uncontrolled, with hyperglycemia, without long-term current use of insulin ([place]) 07/12/2016  . Hypokalemia 07/12/2016  . CHF (congestive heart failure) ([place]) 07/09/2016  . Uncontrolled type 2 diabetes mellitus with complication ([place]) 95/62/1308  . Essential hypertension, benign 06/07/2015  . Cigarette nicotine dependence, uncomplicated 65/78/4696  . Obesity, unspecified 06/07/2015  . Abdominal pain 07/03/2011  . Pulmonary edema 06/07/2011    Class: Acute  . Hypertensive emergency 06/07/2011  . Respiratory failure with hypoxia ([place]) 06/07/2011  .  GERD (gastroesophageal reflux disease) 06/07/2011  . Hemoptysis 06/07/2011  . COPD (chronic obstructive pulmonary disease) ([place]) 06/07/2011  . Arthritis 06/07/2011  . Nicotine abuse 06/07/2011  . Obesity 06/07/2011    Past Surgical History:  Procedure Laterality Date  . ABDOMINAL HYSTERECTOMY  09/10/2011   Procedure: HYSTERECTOMY ABDOMINAL;  Surgeon: Jonnie Kind, MD;  Location: [facility] ORS;  Service: Gynecology;  Laterality: N/A;  Abdominal hysterectomy  . CESAREAN SECTION  [id number], and 1994  . CHOLECYSTECTOMY  1995  . SCAR REVISION  09/10/2011   Procedure: SCAR REVISION;  Surgeon: Jonnie Kind, MD;  Location: [facility] ORS;  Service: Gynecology;  Laterality: N/A;  Wide Excision of old Cicatrix  . TUBAL LIGATION  1994    OB History    Gravida Para Term Preterm AB Living             3   SAB TAB Ectopic Multiple Live Births                   Home Medications    Prior to Admission medications   Medication Sig Start Date End Date Taking? Authorizing Provider  Alcohol Swabs (ALCOHOL PADS) 70 % PADS 1 application by Does not apply route 2 (two) times daily. 07/12/16  Yes Nishant Dhungel, MD  aspirin EC 81 MG tablet Take 1 tablet (81 mg total) by mouth daily. 07/12/16  Yes Nishant Dhungel, MD  blood glucose meter kit and supplies KIT Dispense based on patient and insurance preference. Use up to four times daily as directed. (  FOR ICD-9 250.00, 250.01). 07/12/16  Yes Nishant Dhungel, MD  furosemide (LASIX) 20 MG tablet Take 20 mg by mouth as needed.   Yes Historical Provider, MD  ibuprofen (ADVIL,MOTRIN) 600 MG tablet Take 600 mg by mouth every 6 (six) hours as needed for mild pain or moderate pain.    Yes Historical Provider, MD  insulin NPH-regular Human (NOVOLIN 70/30) (70-30) 100 UNIT/ML injection Inject 15 Units into the skin 2 (two) times daily with a meal. 07/12/16  Yes Nishant Dhungel, MD  Insulin Syringe-Needle U-100 (INSULIN SYRINGE 1CC/28G) 28G X 1/2" 1 ML MISC 1 application by Does not  apply route 2 (two) times daily. 07/12/16  Yes Nishant Dhungel, MD  lisinopril-hydrochlorothiazide (PRINZIDE,ZESTORETIC) 20-12.5 MG tablet Take 2 tablets by mouth daily. 07/12/16  Yes Nishant Dhungel, MD  lisinopril-hydrochlorothiazide (PRINZIDE,ZESTORETIC) 20-25 MG tablet Take 1 tablet by mouth daily.   Yes Historical Provider, MD  metFORMIN (GLUCOPHAGE) 1000 MG tablet Take 1 tablet (1,000 mg total) by mouth 2 (two) times daily with a meal. 07/12/16  Yes Nishant Dhungel, MD  metoprolol (LOPRESSOR) 50 MG tablet Take 1 tablet (50 mg total) by mouth 2 (two) times daily. 07/12/16  Yes Nishant Dhungel, MD  omeprazole (PRILOSEC) 20 MG capsule Take 20 mg by mouth daily.   Yes Historical Provider, MD  HYDROcodone-acetaminophen (NORCO/VICODIN) 5-325 MG tablet Take 1-2 tablets by mouth every 4 (four) hours as needed. 08/19/16   Davonna Belling, MD  sulfamethoxazole-trimethoprim (BACTRIM DS,SEPTRA DS) 800-160 MG tablet Take 1 tablet by mouth 2 (two) times daily. 11/18/16 11/25/16  Elnora Morrison, MD    Family History Family History  Problem Relation Age of Onset  . Cirrhosis Mother   . Diabetes type II Father   . Diabetes type II Sister   . Anesthesia problems Neg Hx   . Hypotension Neg Hx   . Malignant hyperthermia Neg Hx   . Pseudochol deficiency Neg Hx     Social History Social History  Substance Use Topics  . Smoking status: Former Smoker    Packs/day: 0.25    Years: 29.00    Types: Cigarettes    Quit date: 08/31/2015  . Smokeless tobacco: Never Used     Comment: 1 cigarette every 3 days  . Alcohol use No     Allergies   Bee venom; Naproxen; and Penicillins   Review of Systems Review of Systems  Constitutional: Negative for chills and fever.  Respiratory: Negative for shortness of breath.   Cardiovascular: Negative for chest pain and palpitations.  Gastrointestinal: Negative for abdominal pain and vomiting.  Musculoskeletal: Negative for arthralgias and back pain.  Skin: Positive for  rash. Negative for color change.  All other systems reviewed and are negative.    Physical Exam Updated Vital Signs BP (!) 143/81   Pulse 67   Temp 97.7 F (36.5 C) (Oral)   Resp 16   Ht '5\' 6"'$  (1.676 m)   Wt 216 lb (98 kg)   LMP 08/21/2011   SpO2 97%   BMI 34.86 kg/m   Physical Exam  Constitutional: She appears well-developed and well-nourished. No distress.  HENT:  Head: Normocephalic and atraumatic.  Eyes: Conjunctivae are normal.  Neck: Neck supple.  Cardiovascular: Normal rate.   Pulmonary/Chest: Effort normal.  Musculoskeletal: She exhibits no edema.  Neurological: She is alert.  Skin: Skin is warm and dry.  Patient has multiple small pustules posterior thigh and a few suprapubic and inguinal region. No focal induration or significant abscess appreciated. No crepitus.  Psychiatric: She has a normal mood and affect.  Nursing note and vitals reviewed.    ED Treatments / Results  Labs (all labs ordered are listed, but only abnormal results are displayed) Labs Reviewed  CBG MONITORING, ED - Abnormal; Notable for the following:       Result Value   Glucose-Capillary 255 (*)    All other components within normal limits    EKG  EKG Interpretation None       Radiology No results found.  Procedures Procedures (including critical care time)  Medications Ordered in ED Medications  acetaminophen (TYLENOL) tablet 1,000 mg (1,000 mg Oral Given 11/18/16 0328)  sulfamethoxazole-trimethoprim (BACTRIM DS,SEPTRA DS) 800-160 MG per tablet 1 tablet (1 tablet Oral Given 11/18/16 0328)     Initial Impression / Assessment and Plan / ED Course  I have reviewed the triage vital signs and the nursing notes.  Pertinent labs & imaging results that were available during my care of the patient were reviewed by me and considered in my medical decision making (see chart for details).    Well-appearing patient presents with multiple pustules. Discussed possible MRSA.  Discussed chloride seen wash, Bactrim and follow-up with primary doctor.  Results and differential diagnosis were discussed with the patient/parent/guardian. Xrays were independently reviewed by myself.  Close follow up outpatient was discussed, comfortable with the plan.   Medications  acetaminophen (TYLENOL) tablet 1,000 mg (1,000 mg Oral Given 11/18/16 0328)  sulfamethoxazole-trimethoprim (BACTRIM DS,SEPTRA DS) 800-160 MG per tablet 1 tablet (1 tablet Oral Given 11/18/16 0328)    Vitals:   11/18/16 0026  BP: (!) 143/81  Pulse: 67  Resp: 16  Temp: 97.7 F (36.5 C)  TempSrc: Oral  SpO2: 97%  Weight: 216 lb (98 kg)  Height: 5' 6" (1.676 m)    Final diagnoses:  Pustules determined by examination  Rash and nonspecific skin eruption  Personal history of diabetes mellitus     Final Clinical Impressions(s) / ED Diagnoses   Final diagnoses:  Pustules determined by examination  Rash and nonspecific skin eruption  Personal history of diabetes mellitus    New Prescriptions New Prescriptions   SULFAMETHOXAZOLE-TRIMETHOPRIM (BACTRIM DS,SEPTRA DS) 800-160 MG TABLET    Take 1 tablet by mouth 2 (two) times daily.     Elnora Morrison, MD 11/18/16 [phone]

## 2016-11-18 NOTE — ED Triage Notes (Signed)
Pt c/o rash that has gotten worse over the past few days,

## 2016-11-18 NOTE — Discharge Instructions (Signed)
Use chlorhexidine wash for 2-3 days once daily as discussed. Take antibiotics as discussed in clarified your last antibiotic prescription with your primary doctor. Return for worsening rash, fevers or new or worsening symptoms.  If you were given medicines take as directed.  If you are on coumadin or contraceptives realize their levels and effectiveness is altered by many different medicines.  If you have any reaction (rash, tongues swelling, other) to the medicines stop taking and see a physician.    If your blood pressure was elevated in the ER make sure you follow up for management with a primary doctor or return for chest pain, shortness of breath or stroke symptoms.  Please follow up as directed and return to the ER or see a physician for new or worsening symptoms.  Thank you. Vitals:   11/18/16 0026  BP: (!) 143/81  Pulse: 67  Resp: 16  Temp: 97.7 F (36.5 C)  TempSrc: Oral  SpO2: 97%  Weight: 216 lb (98 kg)  Height: 5\' 6"  (1.676 m)

## 2016-11-18 NOTE — ED Notes (Signed)
Pt states she has had a rash for the past 3 days that is getting worse. Pt states itching & burning. Denies any fever, no new foods, clothing, meds, or detergents.

## 2017-04-19 ENCOUNTER — Emergency Department (HOSPITAL_COMMUNITY): Payer: Self-pay

## 2017-04-19 ENCOUNTER — Emergency Department (HOSPITAL_COMMUNITY)
Admission: EM | Admit: 2017-04-19 | Discharge: 2017-04-19 | Disposition: A | Discharge status: Home / Self Care | Payer: Self-pay | Attending: Emergency Medicine | Admitting: Emergency Medicine

## 2017-04-19 ENCOUNTER — Other Ambulatory Visit: Payer: Self-pay

## 2017-04-19 DIAGNOSIS — Z87891 Personal history of nicotine dependence: Secondary | ICD-10-CM | POA: Insufficient documentation

## 2017-04-19 DIAGNOSIS — I110 Hypertensive heart disease with heart failure: Secondary | ICD-10-CM | POA: Insufficient documentation

## 2017-04-19 DIAGNOSIS — Z79899 Other long term (current) drug therapy: Secondary | ICD-10-CM | POA: Insufficient documentation

## 2017-04-19 DIAGNOSIS — E119 Type 2 diabetes mellitus without complications: Secondary | ICD-10-CM | POA: Insufficient documentation

## 2017-04-19 DIAGNOSIS — Z794 Long term (current) use of insulin: Secondary | ICD-10-CM | POA: Insufficient documentation

## 2017-04-19 DIAGNOSIS — R7989 Other specified abnormal findings of blood chemistry: Secondary | ICD-10-CM | POA: Insufficient documentation

## 2017-04-19 DIAGNOSIS — M79671 Pain in right foot: Secondary | ICD-10-CM

## 2017-04-19 DIAGNOSIS — Z7982 Long term (current) use of aspirin: Secondary | ICD-10-CM | POA: Insufficient documentation

## 2017-04-19 DIAGNOSIS — I10 Essential (primary) hypertension: Secondary | ICD-10-CM

## 2017-04-19 DIAGNOSIS — J449 Chronic obstructive pulmonary disease, unspecified: Secondary | ICD-10-CM | POA: Insufficient documentation

## 2017-04-19 DIAGNOSIS — Z9049 Acquired absence of other specified parts of digestive tract: Secondary | ICD-10-CM | POA: Insufficient documentation

## 2017-04-19 DIAGNOSIS — I5031 Acute diastolic (congestive) heart failure: Secondary | ICD-10-CM | POA: Insufficient documentation

## 2017-04-19 DIAGNOSIS — R05 Cough: Secondary | ICD-10-CM | POA: Insufficient documentation

## 2017-04-19 DIAGNOSIS — I2510 Atherosclerotic heart disease of native coronary artery without angina pectoris: Secondary | ICD-10-CM | POA: Insufficient documentation

## 2017-04-19 LAB — CBC
HCT: 42.9 % (ref 36.0–46.0)
Hemoglobin: 14.2 g/dL (ref 12.0–15.0)
MCH: 28.8 pg (ref 26.0–34.0)
MCHC: 33.1 g/dL (ref 30.0–36.0)
MCV: 87 fL (ref 78.0–100.0)
Platelets: 218 10*3/uL (ref 150–400)
RBC: 4.93 MIL/uL (ref 3.87–5.11)
RDW: 13 % (ref 11.5–15.5)
WBC: 9.1 10*3/uL (ref 4.0–10.5)

## 2017-04-19 LAB — I-STAT TROPONIN, ED: Troponin i, poc: 0.02 ng/mL (ref 0.00–0.08)

## 2017-04-19 LAB — I-STAT CHEM 8, ED
BUN: 10 mg/dL (ref 6–20)
Calcium, Ion: 0.97 mmol/L — ABNORMAL LOW (ref 1.15–1.40)
Chloride: 101 mmol/L (ref 101–111)
Creatinine, Ser: 0.7 mg/dL (ref 0.44–1.00)
Glucose, Bld: 287 mg/dL — ABNORMAL HIGH (ref 65–99)
HCT: 44 % (ref 36.0–46.0)
Hemoglobin: 15 g/dL (ref 12.0–15.0)
Potassium: 4.1 mmol/L (ref 3.5–5.1)
Sodium: 138 mmol/L (ref 135–145)
TCO2: 25 mmol/L (ref 22–32)

## 2017-04-19 LAB — BASIC METABOLIC PANEL
Anion gap: 10 (ref 5–15)
BUN: 9 mg/dL (ref 6–20)
CO2: 23 mmol/L (ref 22–32)
Calcium: 8.7 mg/dL — ABNORMAL LOW (ref 8.9–10.3)
Chloride: 101 mmol/L (ref 101–111)
Creatinine, Ser: 0.83 mg/dL (ref 0.44–1.00)
GFR calc Af Amer: >60 mL/min (ref >60)
GFR calc non Af Amer: >60 mL/min (ref >60)
Glucose, Bld: 276 mg/dL — ABNORMAL HIGH (ref 65–99)
Potassium: 3.9 mmol/L (ref 3.5–5.1)
Sodium: 134 mmol/L — ABNORMAL LOW (ref 135–145)

## 2017-04-19 LAB — BRAIN NATRIURETIC PEPTIDE: B Natriuretic Peptide: 288.6 pg/mL — ABNORMAL HIGH (ref 0.0–100.0)

## 2017-04-19 IMAGING — DX DG FOOT COMPLETE 3+V*R*
3 series · 3 of 3 positions shown · non-contrast
Comparison: None.

CLINICAL DATA: A foreign body was stuck in foot near the head of
the fifth metatarsal 1 month ago with continued pain.

EXAM:
RIGHT FOOT COMPLETE - 3+ VIEW

[x foot ap right]
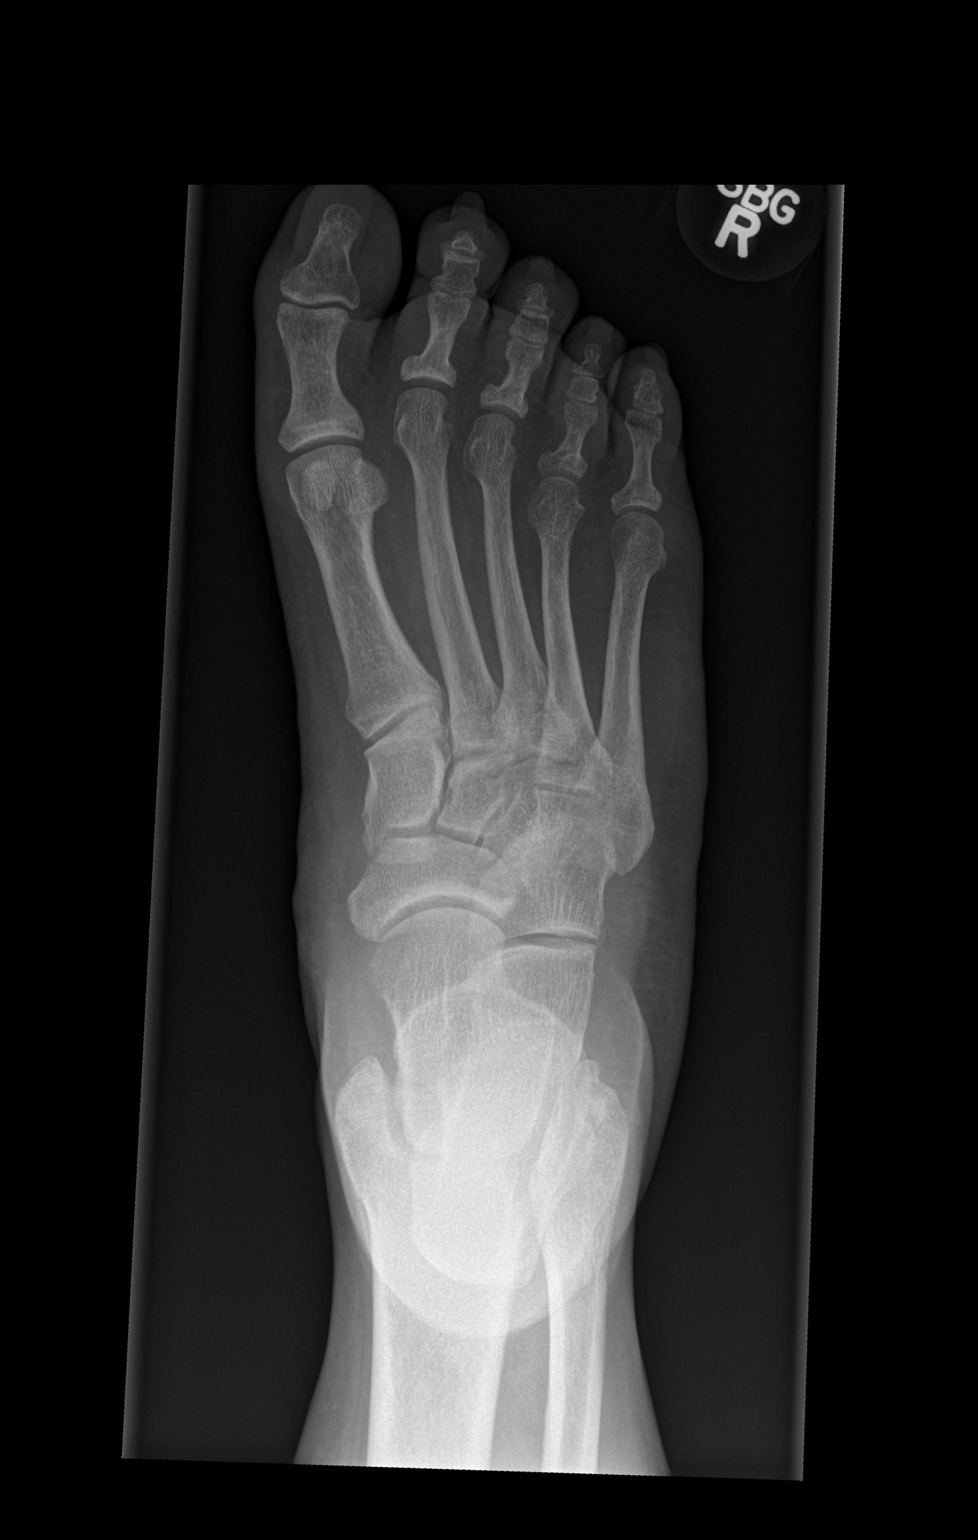

[x foot obl right]
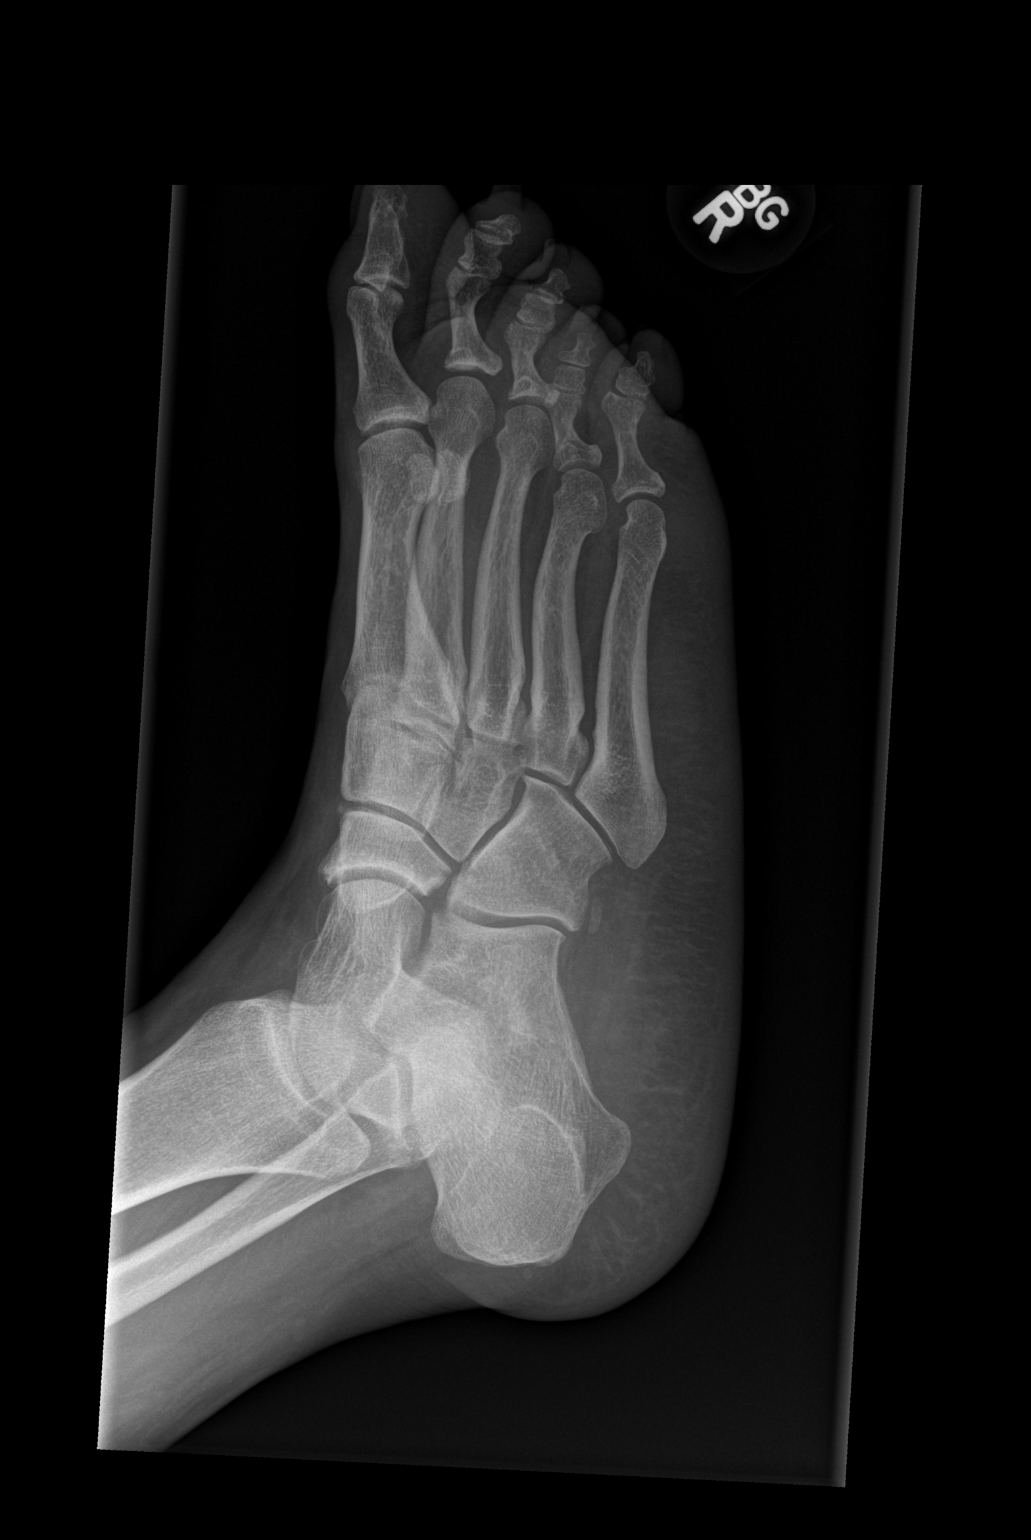

[x foot lat right]
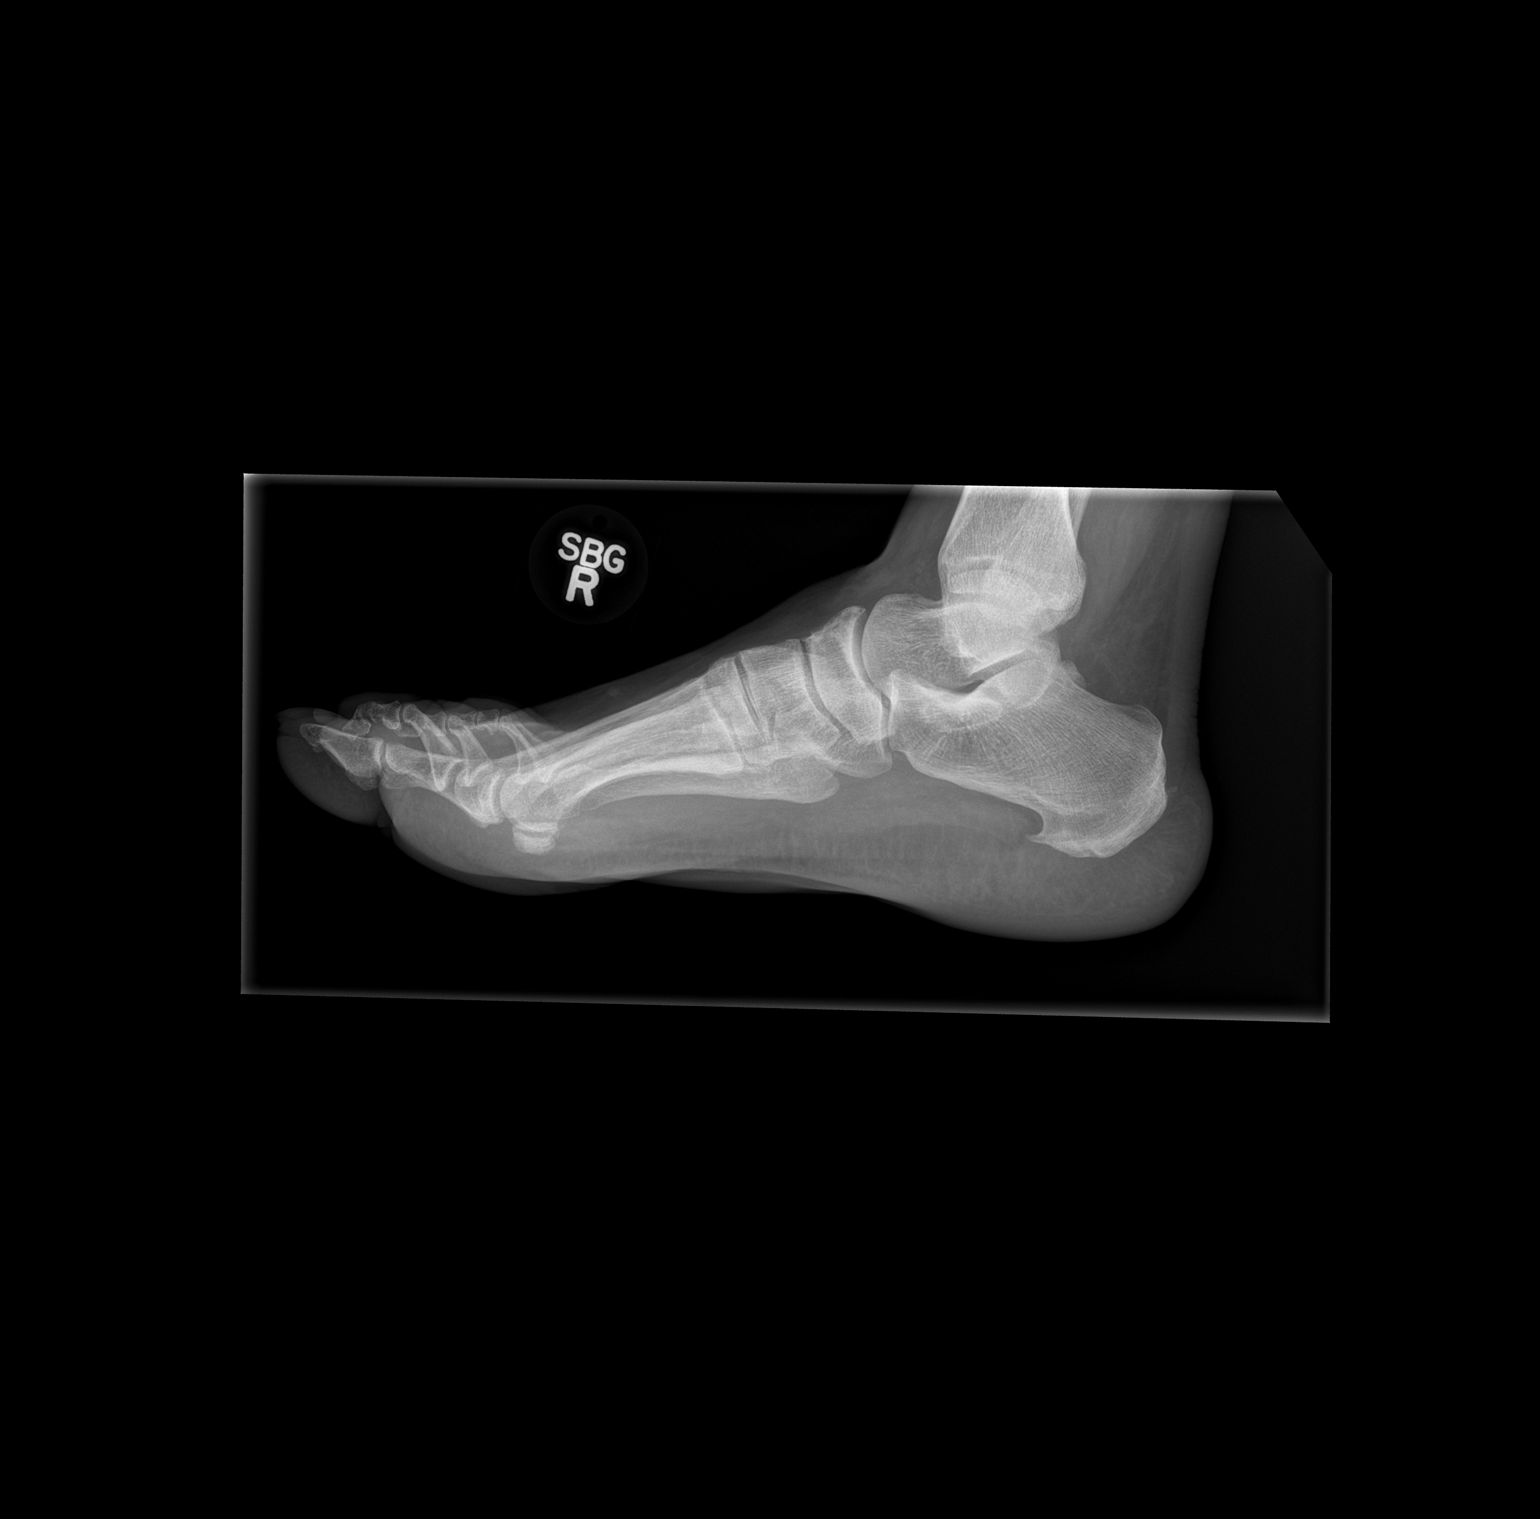

[3 of 3 positions shown; findings below may reference images not displayed]

FINDINGS: There is no evidence of fracture or dislocation. There is no
evidence of arthropathy or other focal bone abnormality. Soft
tissues are unremarkable.
IMPRESSION: Normal study.  No foreign body identified.

## 2017-04-19 IMAGING — DX DG CHEST 2V
2 series · 2 of 2 positions shown · non-contrast
Comparison: [DATE]

CLINICAL DATA: Shortness of breath.  Cough.

EXAM:
CHEST  2 VIEW

[w chest pa]
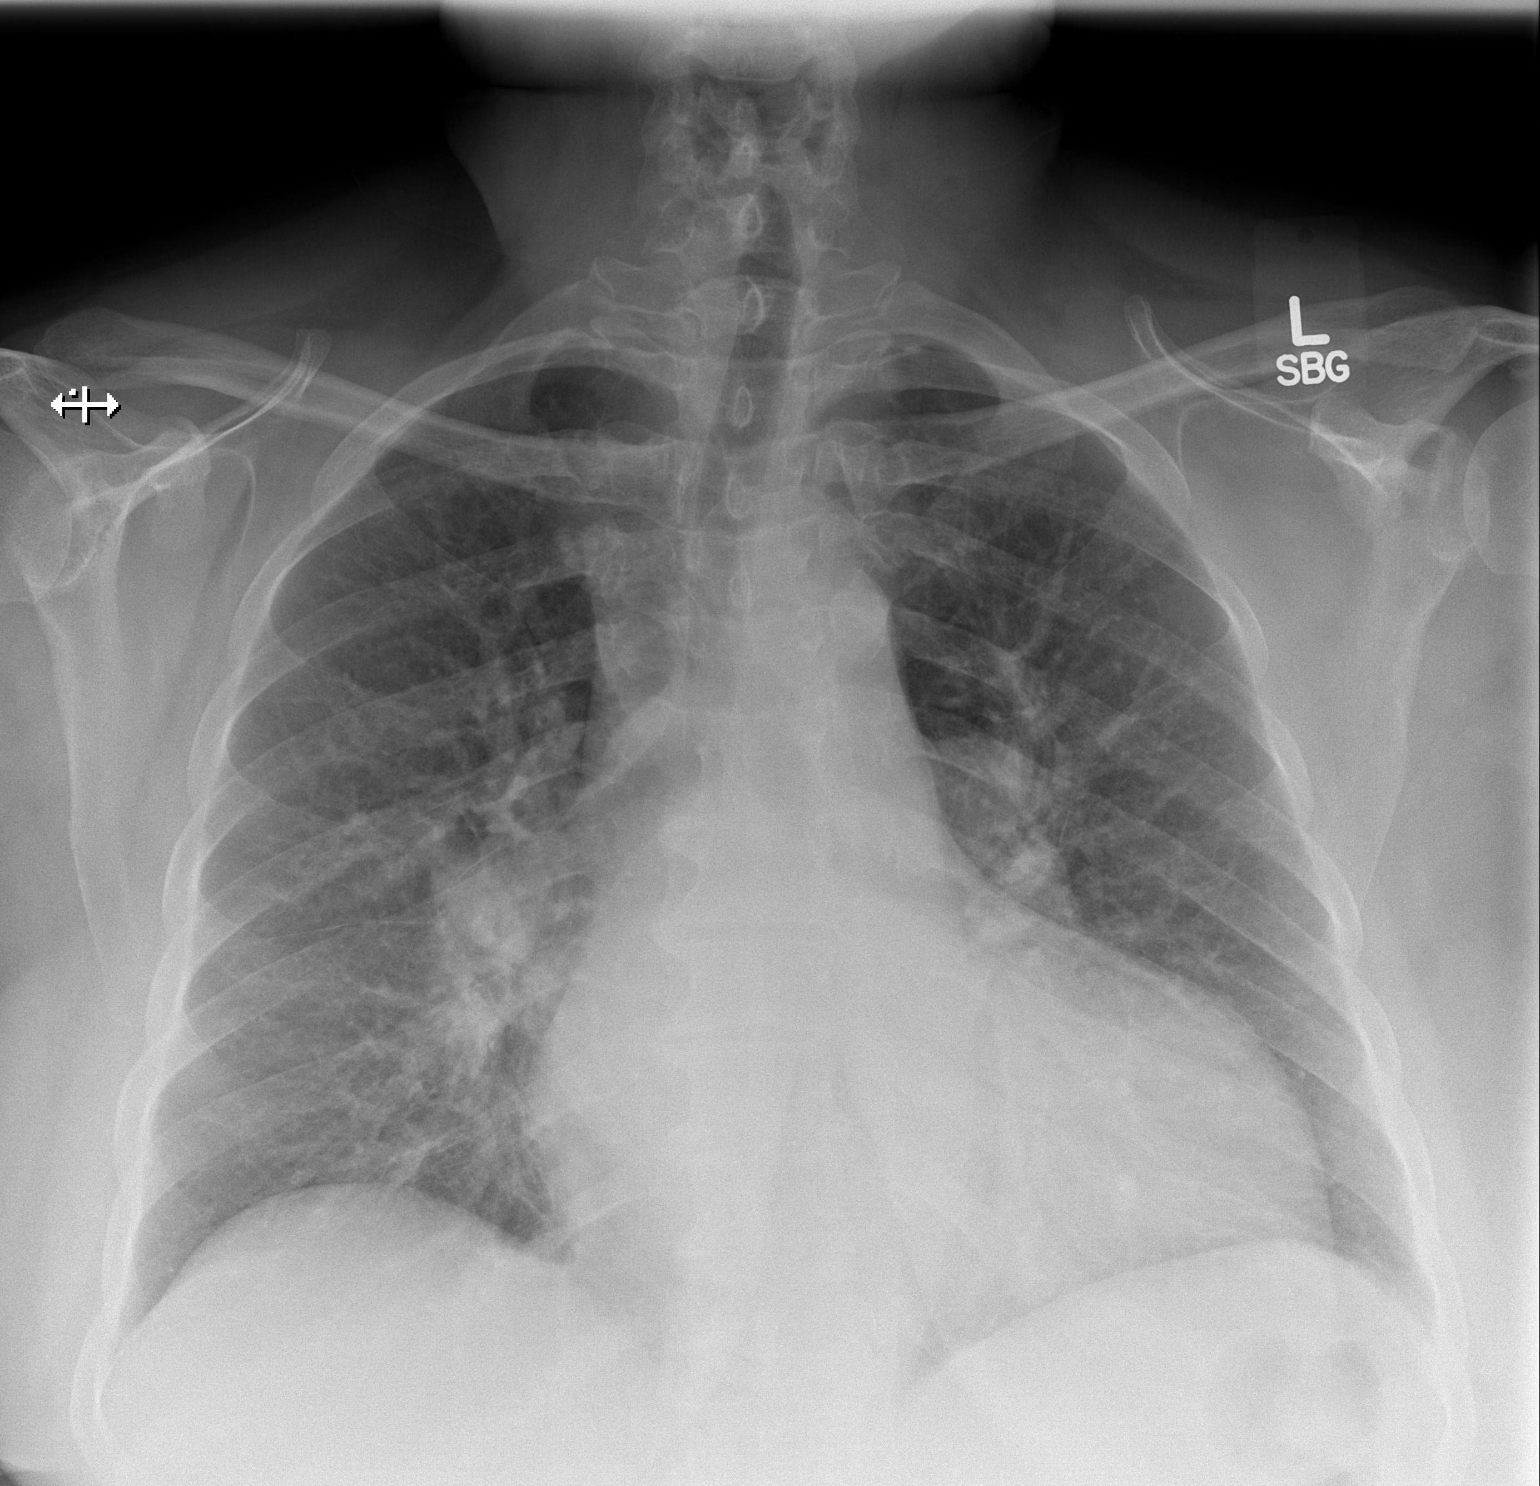

[w chest lat]
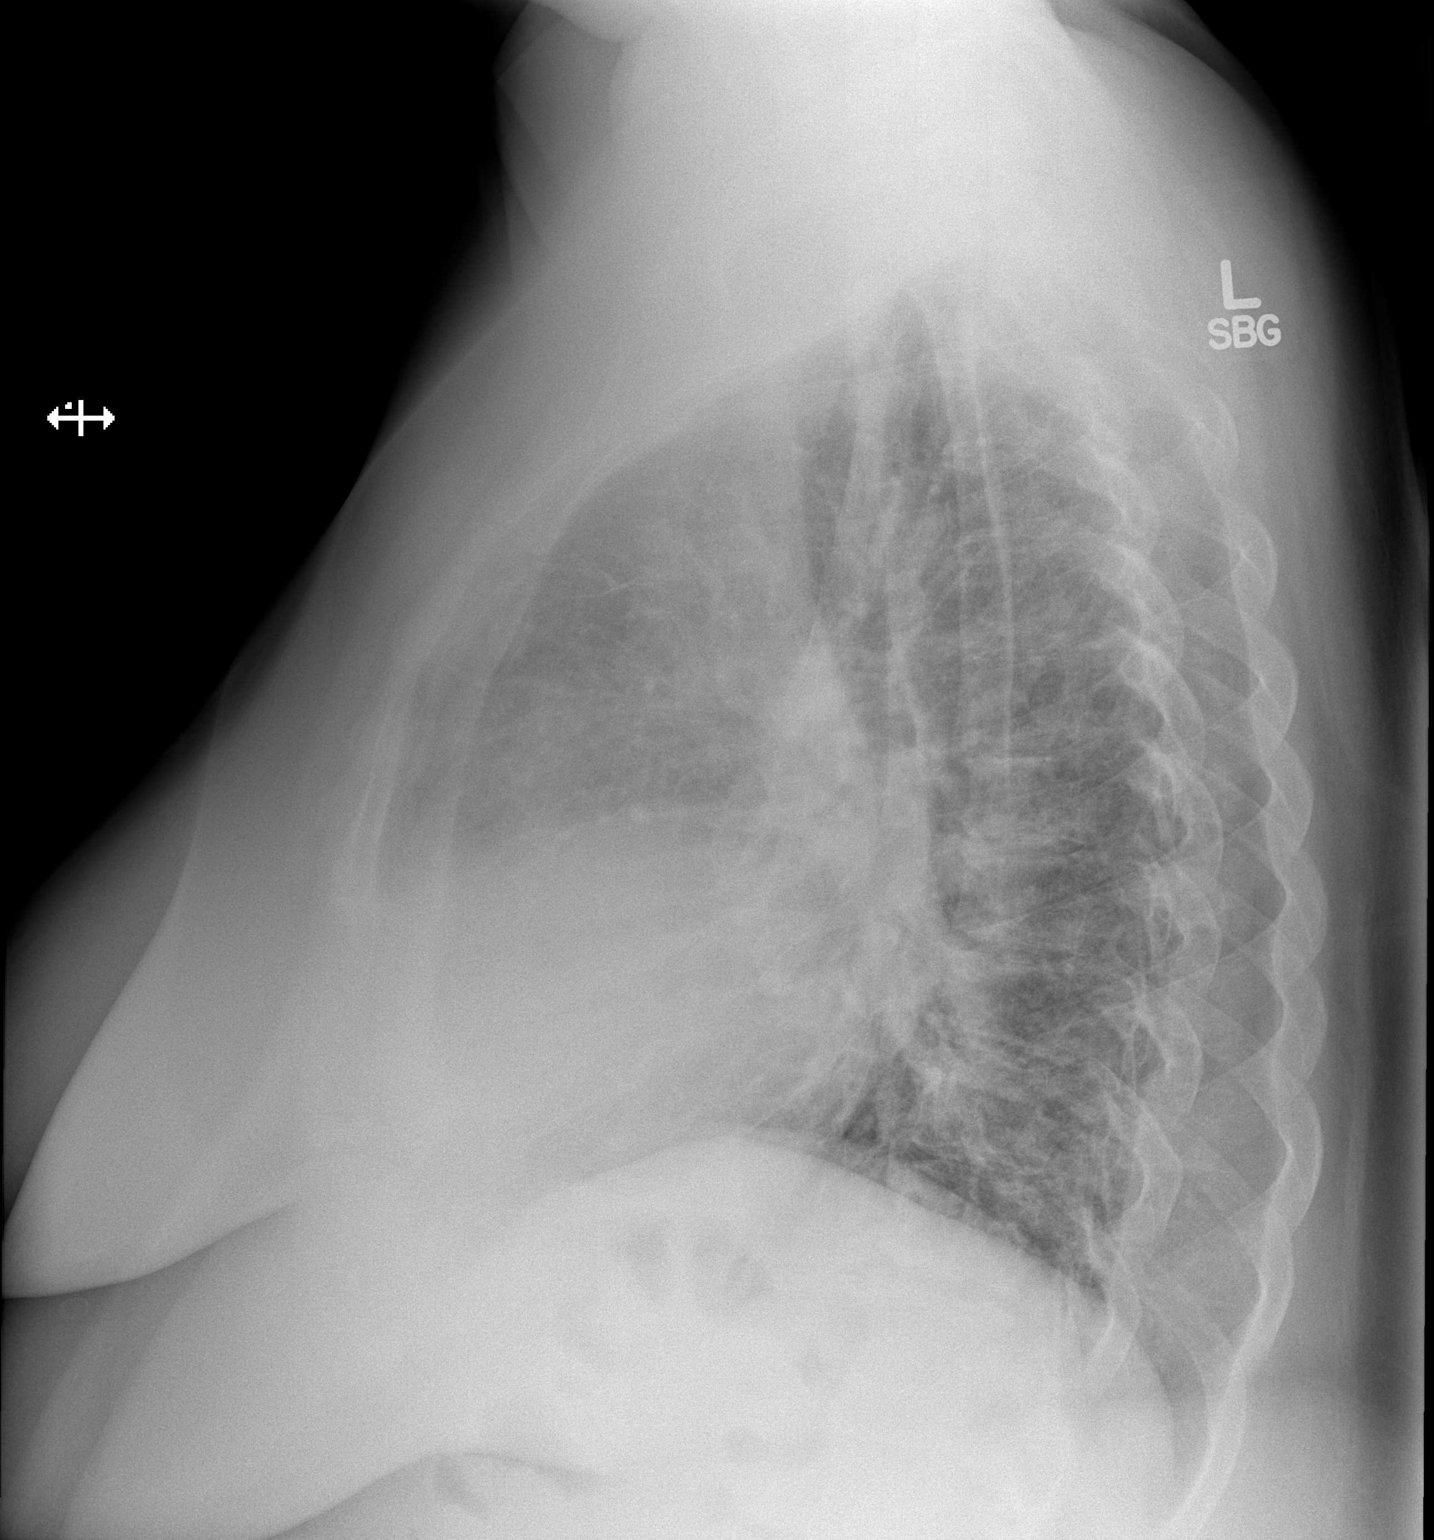

[2 of 2 positions shown; findings below may reference images not displayed]

FINDINGS: No pneumothorax. Cardiomegaly. The hila and mediastinum are
unchanged. Increased interstitial markings in the lungs, right
slightly greater than left. No focal infiltrate, nodule, or mass.
IMPRESSION: Cardiomegaly. Probable pulmonary venous congestion. No other acute
abnormalities.

## 2017-04-19 MED ORDER — CLINDAMYCIN HCL 300 MG PO CAPS: 300.0000 mg | ORAL_CAPSULE | Freq: Three times a day (TID) | ORAL | 0 refills | Status: AC

## 2017-04-19 MED ORDER — CLINDAMYCIN HCL 150 MG PO CAPS: 300.0000 mg | ORAL_CAPSULE | Freq: Once | ORAL | Status: DC

## 2017-04-19 MED ORDER — CLINDAMYCIN PHOSPHATE 600 MG/50ML IV SOLN
600.0000 mg | Freq: Once | INTRAVENOUS | Status: AC
  Administered 2017-04-19: 600 mg via INTRAVENOUS
  Filled 2017-04-19: qty 50

## 2017-04-19 MED ORDER — TRAMADOL HCL 50 MG PO TABS: 50.0000 mg | ORAL_TABLET | Freq: Four times a day (QID) | ORAL | 0 refills | Status: DC | PRN

## 2017-04-19 MED ORDER — ALBUTEROL SULFATE (2.5 MG/3ML) 0.083% IN NEBU
5.0000 mg | INHALATION_SOLUTION | Freq: Once | RESPIRATORY_TRACT | Status: AC
  Administered 2017-04-19: 5 mg via RESPIRATORY_TRACT

## 2017-04-19 MED ORDER — ALBUTEROL SULFATE (2.5 MG/3ML) 0.083% IN NEBU
INHALATION_SOLUTION | RESPIRATORY_TRACT | Status: AC
  Filled 2017-04-19: qty 3

## 2017-04-19 MED ORDER — HYDROCODONE-ACETAMINOPHEN 5-325 MG PO TABS
2.0000 | ORAL_TABLET | Freq: Once | ORAL | Status: AC
  Administered 2017-04-19: 2 via ORAL
  Filled 2017-04-19: qty 2

## 2017-04-19 NOTE — ED Triage Notes (Addendum)
Pt states 2 weeks ago she stepped on a piece of glass in her bedroom, think she has it still in her right foot. Pt also says she has bronchitis/COPD and wants a breathing treatment as well. No shortness of breath, no acute distress.  Hypertensive at triage, states she took herself off of her lisinopril 3 weeks ago because she "saw bad things about it on facebook and they started happening to her" no laceration or obvious foreign body visible to foot

## 2017-04-19 NOTE — ED Provider Notes (Signed)
[facility] DEPT Provider Note   CSN: [id number] Arrival date & time: 04/19/17  1450     History   Chief Complaint Chief Complaint  Patient presents with  . Foot Pain  . Cough    HPI Robin [name] is a 51 y.o. female.  HPI   51 yo F with PMHx CHF, COPD, HTN, CAD here with foot pain, cough.  Primary complaint is right fifth toe pain. Pt stepped on glass 1 month ago. She states it bled "just a little" at the time. Since then, the wound has healed but she has had persistent aching, throbbing toe pain. Pain is worse with movement, weightbearing, and palpation. She has not had any purulence. No other open wounds. No pain beyond the area of her wound.  Pt also endorses cough x 2-3 weeks. She feels like it is her COPD/bronchitis. It improved with albuterol in the waiting room. Denies leg swelling. Denies known weight gain. No hemoptysis. No associated chest pain.  Past Medical History:  Diagnosis Date  . Arthritis   . CHF (congestive heart failure) ([place])   . Chronic abdominal pain   . COPD (chronic obstructive pulmonary disease) ([place])   . Diabetes mellitus   . Essential hypertension, benign   . GERD (gastroesophageal reflux disease)   . Gout   . Gout 2016  . Normal coronary arteries    3/10 - following abnormal Myoview  . Ovarian cyst   . Type 2 diabetes mellitus [facility])     Patient Active Problem List   Diagnosis Date Noted  . Acute diastolic CHF (congestive heart failure) ([place]) 07/12/2016  . Diabetes mellitus due to underlying condition, uncontrolled, with hyperglycemia, without long-term current use of insulin ([place]) 07/12/2016  . Hypokalemia 07/12/2016  . CHF (congestive heart failure) ([place]) 07/09/2016  . Uncontrolled type 2 diabetes mellitus with complication ([place]) 37/34/2876  . Essential hypertension, benign 06/07/2015  . Cigarette nicotine dependence, uncomplicated 81/15/7262  . Obesity, unspecified 06/07/2015  . Abdominal pain 07/03/2011  . Pulmonary edema  06/07/2011    Class: Acute  . Hypertensive emergency 06/07/2011  . Respiratory failure with hypoxia ([place]) 06/07/2011  . GERD (gastroesophageal reflux disease) 06/07/2011  . Hemoptysis 06/07/2011  . COPD (chronic obstructive pulmonary disease) ([place]) 06/07/2011  . Arthritis 06/07/2011  . Nicotine abuse 06/07/2011  . Obesity 06/07/2011    Past Surgical History:  Procedure Laterality Date  . ABDOMINAL HYSTERECTOMY  09/10/2011   Procedure: HYSTERECTOMY ABDOMINAL;  Surgeon: Jonnie Kind, MD;  Location: [facility] ORS;  Service: Gynecology;  Laterality: N/A;  Abdominal hysterectomy  . CESAREAN SECTION  [id number], and 1994  . CHOLECYSTECTOMY  1995  . SCAR REVISION  09/10/2011   Procedure: SCAR REVISION;  Surgeon: Jonnie Kind, MD;  Location: [facility] ORS;  Service: Gynecology;  Laterality: N/A;  Wide Excision of old Cicatrix  . TUBAL LIGATION  1994    OB History    Gravida Para Term Preterm AB Living             3   SAB TAB Ectopic Multiple Live Births                   Home Medications    Prior to Admission medications   Medication Sig Start Date End Date Taking? Authorizing Provider  Alcohol Swabs (ALCOHOL PADS) 70 % PADS 1 application by Does not apply route 2 (two) times daily. 07/12/16   Dhungel, Flonnie Overman, MD  aspirin EC 81 MG tablet Take 1 tablet (81 mg  total) by mouth daily. 07/12/16   Dhungel, Theda Belfast, MD  blood glucose meter kit and supplies KIT Dispense based on patient and insurance preference. Use up to four times daily as directed. (FOR ICD-9 250.00, 250.01). 07/12/16   Dhungel, Theda Belfast, MD  clindamycin (CLEOCIN) 300 MG capsule Take 1 capsule (300 mg total) by mouth 3 (three) times daily. 04/19/17 04/29/17  Shaune Pollack, MD  furosemide (LASIX) 20 MG tablet Take 20 mg by mouth as needed.    [provider]  HYDROcodone-acetaminophen (NORCO/VICODIN) 5-325 MG tablet Take 1-2 tablets by mouth every 4 (four) hours as needed. 08/19/16   Benjiman Core, MD  ibuprofen  (ADVIL,MOTRIN) 600 MG tablet Take 600 mg by mouth every 6 (six) hours as needed for mild pain or moderate pain.     [provider]  insulin NPH-regular Human (NOVOLIN 70/30) (70-30) 100 UNIT/ML injection Inject 15 Units into the skin 2 (two) times daily with a meal. 07/12/16   Dhungel, Nishant, MD  Insulin Syringe-Needle U-100 (INSULIN SYRINGE 1CC/28G) 28G X 1/2" 1 ML MISC 1 application by Does not apply route 2 (two) times daily. 07/12/16   Dhungel, Theda Belfast, MD  lisinopril-hydrochlorothiazide (PRINZIDE,ZESTORETIC) 20-12.5 MG tablet Take 2 tablets by mouth daily. 07/12/16   Dhungel, Theda Belfast, MD  lisinopril-hydrochlorothiazide (PRINZIDE,ZESTORETIC) 20-25 MG tablet Take 1 tablet by mouth daily.    [provider]  metFORMIN (GLUCOPHAGE) 1000 MG tablet Take 1 tablet (1,000 mg total) by mouth 2 (two) times daily with a meal. 07/12/16   Dhungel, Nishant, MD  metoprolol (LOPRESSOR) 50 MG tablet Take 1 tablet (50 mg total) by mouth 2 (two) times daily. 07/12/16   Dhungel, Theda Belfast, MD  omeprazole (PRILOSEC) 20 MG capsule Take 20 mg by mouth daily.    [provider]  traMADol (ULTRAM) 50 MG tablet Take 1 tablet (50 mg total) by mouth every 6 (six) hours as needed for severe pain. 04/19/17   Shaune Pollack, MD    Family History Family History  Problem Relation Age of Onset  . Cirrhosis Mother   . Diabetes type II Father   . Diabetes type II Sister   . Anesthesia problems Neg Hx   . Hypotension Neg Hx   . Malignant hyperthermia Neg Hx   . Pseudochol deficiency Neg Hx     Social History Social History  Substance Use Topics  . Smoking status: Former Smoker    Packs/day: 0.25    Years: 29.00    Types: Cigarettes    Quit date: 08/31/2015  . Smokeless tobacco: Never Used     Comment: 1 cigarette every 3 days  . Alcohol use No     Allergies   Bee venom; Naproxen; and Penicillins   Review of Systems Review of Systems  Constitutional: Positive for fatigue.    Respiratory: Positive for cough.   Musculoskeletal: Positive for arthralgias and gait problem.  Skin: Positive for wound.  All other systems reviewed and are negative.    Physical Exam Updated Vital Signs BP (!) 193/96   Pulse 71   Temp 98 F (36.7 C)   Resp 16   LMP 08/21/2011   SpO2 98%   Physical Exam  Constitutional: She is oriented to [place], place, [name] No distress.  HENT:  Head: Normocephalic and atraumatic.  Eyes: Conjunctivae are normal.  Neck: Neck supple.  Cardiovascular: Normal rate, regular rhythm and normal heart sounds.  Exam reveals no friction rub.   No murmur heard. Pulmonary/Chest: Effort normal  and breath sounds normal. No respiratory distress. She has no wheezes. She has no rales.  Abdominal: She exhibits no distension.  Musculoskeletal: She exhibits no edema.  Neurological: She is alert and oriented to [place], place, and time. She exhibits normal muscle tone.  Skin: Skin is warm. Capillary refill takes less than 2 seconds.  Psychiatric: She has a normal mood and affect.  Nursing note and vitals reviewed.   LOWER EXTREMITY EXAM: RIGHT  INSPECTION & PALPATION: Mild TTP over MTP joint of fifth (small) toe. Mil erythema, but no fluctuance, drainage. No skin wounds or open lesions. No palpable Fb.  SENSORY: sensation is intact to light touch in:  Superficial peroneal nerve distribution (over dorsum of foot) Deep peroneal nerve distribution (over first dorsal web space) Sural nerve distribution (over lateral aspect 5th metatarsal) Saphenous nerve distribution (over medial instep)  MOTOR:  + Motor EHL (great toe dorsiflexion) + FHL (great toe plantar flexion)  + TA (ankle dorsiflexion)  + GSC (ankle plantar flexion)  VASCULAR: 2+ dorsalis pedis and posterior tibialis pulses Capillary refill < 2 sec, toes warm and well-perfused  COMPARTMENTS: Soft, warm, well-perfused No pain with passive  extension No parethesias   ED Treatments / Results  Labs (all labs ordered are listed, but only abnormal results are displayed) Labs Reviewed  BASIC METABOLIC PANEL - Abnormal; Notable for the following:       Result Value   Sodium 134 (*)    Glucose, Bld 276 (*)    Calcium 8.7 (*)    All other components within normal limits  BRAIN NATRIURETIC PEPTIDE - Abnormal; Notable for the following:    B Natriuretic Peptide 288.6 (*)    All other components within normal limits  I-STAT CHEM 8, ED - Abnormal; Notable for the following:    Glucose, Bld 287 (*)    Calcium, Ion 0.97 (*)    All other components within normal limits  CBC  I-STAT TROPONIN, ED  I-STAT TROPONIN, ED    EKG  EKG Interpretation None       Radiology Dg Chest 2 View  Result Date: 04/19/2017 CLINICAL DATA:  Shortness of breath.  Cough. EXAM: CHEST  2 VIEW COMPARISON:  August 19, 2016 FINDINGS: No pneumothorax. Cardiomegaly. The hila and mediastinum are unchanged. Increased interstitial markings in the lungs, right slightly greater than left. No focal infiltrate, nodule, or mass. IMPRESSION: Cardiomegaly. Probable pulmonary venous congestion. No other acute abnormalities. Electronically Signed   By: Dorise Bullion III M.D   On: 04/19/2017 16:02   Dg Foot Complete Right  Result Date: 04/19/2017 CLINICAL DATA:  A foreign body was stuck in foot near the head of the fifth metatarsal 1 month ago with continued pain. EXAM: RIGHT FOOT COMPLETE - 3+ VIEW COMPARISON:  None. FINDINGS: There is no evidence of fracture or dislocation. There is no evidence of arthropathy or other focal bone abnormality. Soft tissues are unremarkable. IMPRESSION: Normal study.  No foreign body identified. Electronically Signed   By: Dorise Bullion III M.D   On: 04/19/2017 16:01    Procedures Procedures (including critical care time)  Medications Ordered in ED Medications  albuterol (PROVENTIL) (2.5 MG/3ML) 0.083% nebulizer solution 5 mg  ( Nebulization Not Given 04/19/17 1640)  HYDROcodone-acetaminophen (NORCO/VICODIN) 5-325 MG per tablet 2 tablet (2 tablets Oral Given 04/19/17 1743)  clindamycin (CLEOCIN) IVPB 600 mg (0 mg Intravenous Stopped 04/19/17 1820)     Initial Impression / Assessment and Plan / ED Course  I have reviewed the  triage vital signs and the nursing notes.  Pertinent labs & imaging results that were available during my care of the patient were reviewed by me and considered in my medical decision making (see chart for details).     51 yo F with PMhx as above here with cough, right foot pain.  Right foot pain - likely 2/2 possible small, residual glass FB from injury one month ago. May also be 2/2 small contusion from this injury. This is not appreciable on exam or imaging. She has minimal erythema to the area. At thsi time, given her DM, I feel risk of opening wound/exploration in the ED far outweigh the benefits. Will place on [place], refer to Podiatrist for later removal. No signs of ulceration or deep infection. No sepsis.  Cough - likely 2/2 mild hypervolemia 2/2 nonadherence with lasix and antihypertensives. May also be a component of COPD. No signs of severe CHF. Trop neg, EKG non-ischemic. No signs of DVT. Will advise her to take her lasix scheduled x 1 week, f/u with PCP.  Final Clinical Impressions(s) / ED Diagnoses   Final diagnoses:  Essential hypertension  Right foot pain  Elevated brain natriuretic peptide (BNP) level    New Prescriptions Discharge Medication List as of 04/19/2017  7:36 PM    START taking these medications   Details  clindamycin (CLEOCIN) 300 MG capsule Take 1 capsule (300 mg total) by mouth 3 (three) times daily., Starting Sat 04/19/2017, Until Tue 04/29/2017, Print    traMADol (ULTRAM) 50 MG tablet Take 1 tablet (50 mg total) by mouth every 6 (six) hours as needed for severe pain., Starting Sat 04/19/2017, Print         Duffy Bruce, MD 04/20/17 [phone]

## 2017-04-19 NOTE — Discharge Instructions (Signed)
YOU NEED TO START TAKING YOUR LASIX. TAKE THE LASIX ONCE A DAY, AS PRESCRIBED, FOR AT LEAST THE NEXT 5-7 DAYS, TO HELP WITH YOUR BREATHING  USE YOUR INHALERS AS NEEDED   FOLLOW-UP WITH YOUR DOCTOR

## 2017-08-09 ENCOUNTER — Emergency Department (HOSPITAL_COMMUNITY)
Admission: EM | Admit: 2017-08-09 | Discharge: 2017-08-09 | Disposition: A | Discharge status: Home / Self Care | Payer: Self-pay | Attending: Emergency Medicine | Admitting: Emergency Medicine

## 2017-08-09 ENCOUNTER — Encounter (HOSPITAL_COMMUNITY): Payer: Self-pay

## 2017-08-09 ENCOUNTER — Emergency Department (HOSPITAL_COMMUNITY): Payer: Self-pay

## 2017-08-09 DIAGNOSIS — I110 Hypertensive heart disease with heart failure: Secondary | ICD-10-CM | POA: Insufficient documentation

## 2017-08-09 DIAGNOSIS — J449 Chronic obstructive pulmonary disease, unspecified: Secondary | ICD-10-CM | POA: Insufficient documentation

## 2017-08-09 DIAGNOSIS — I5032 Chronic diastolic (congestive) heart failure: Secondary | ICD-10-CM | POA: Insufficient documentation

## 2017-08-09 DIAGNOSIS — E119 Type 2 diabetes mellitus without complications: Secondary | ICD-10-CM | POA: Insufficient documentation

## 2017-08-09 DIAGNOSIS — Z794 Long term (current) use of insulin: Secondary | ICD-10-CM | POA: Insufficient documentation

## 2017-08-09 DIAGNOSIS — Z7982 Long term (current) use of aspirin: Secondary | ICD-10-CM | POA: Insufficient documentation

## 2017-08-09 DIAGNOSIS — J189 Pneumonia, unspecified organism: Secondary | ICD-10-CM | POA: Insufficient documentation

## 2017-08-09 DIAGNOSIS — Z79899 Other long term (current) drug therapy: Secondary | ICD-10-CM | POA: Insufficient documentation

## 2017-08-09 DIAGNOSIS — Z87891 Personal history of nicotine dependence: Secondary | ICD-10-CM | POA: Insufficient documentation

## 2017-08-09 LAB — BASIC METABOLIC PANEL
Anion gap: 11 (ref 5–15)
BUN: 9 mg/dL (ref 6–20)
CO2: 23 mmol/L (ref 22–32)
Calcium: 8.9 mg/dL (ref 8.9–10.3)
Chloride: 101 mmol/L (ref 101–111)
Creatinine, Ser: 0.75 mg/dL (ref 0.44–1.00)
GFR calc Af Amer: >60 mL/min (ref >60)
GFR calc non Af Amer: >60 mL/min (ref >60)
Glucose, Bld: 360 mg/dL — ABNORMAL HIGH (ref 65–99)
Potassium: 3.2 mmol/L — ABNORMAL LOW (ref 3.5–5.1)
Sodium: 135 mmol/L (ref 135–145)

## 2017-08-09 LAB — CBC WITH DIFFERENTIAL/PLATELET
Basophils Absolute: 0 10*3/uL (ref 0.0–0.1)
Basophils Relative: 1 %
Eosinophils Absolute: 0.1 10*3/uL (ref 0.0–0.7)
Eosinophils Relative: 1 %
HCT: 41.3 % (ref 36.0–46.0)
Hemoglobin: 13.3 g/dL (ref 12.0–15.0)
Lymphocytes Relative: 38 %
Lymphs Abs: 2.3 10*3/uL (ref 0.7–4.0)
MCH: 27.5 pg (ref 26.0–34.0)
MCHC: 32.2 g/dL (ref 30.0–36.0)
MCV: 85.5 fL (ref 78.0–100.0)
Monocytes Absolute: 0.5 10*3/uL (ref 0.1–1.0)
Monocytes Relative: 9 %
Neutro Abs: 3.1 10*3/uL (ref 1.7–7.7)
Neutrophils Relative %: 51 %
Platelets: 238 10*3/uL (ref 150–400)
RBC: 4.83 MIL/uL (ref 3.87–5.11)
RDW: 13.9 % (ref 11.5–15.5)
WBC: 6.1 10*3/uL (ref 4.0–10.5)

## 2017-08-09 LAB — I-STAT TROPONIN, ED: Troponin i, poc: 0.01 ng/mL (ref 0.00–0.08)

## 2017-08-09 LAB — HEPATIC FUNCTION PANEL
ALT: 41 U/L (ref 14–54)
AST: 42 U/L — ABNORMAL HIGH (ref 15–41)
Albumin: 3.5 g/dL (ref 3.5–5.0)
Alkaline Phosphatase: 114 U/L (ref 38–126)
Bilirubin, Direct: 0.5 mg/dL (ref 0.1–0.5)
Indirect Bilirubin: 1 mg/dL — ABNORMAL HIGH (ref 0.3–0.9)
Total Bilirubin: 1.5 mg/dL — ABNORMAL HIGH (ref 0.3–1.2)
Total Protein: 6.9 g/dL (ref 6.5–8.1)

## 2017-08-09 LAB — LIPASE, BLOOD: Lipase: 31 U/L (ref 11–51)

## 2017-08-09 LAB — TROPONIN I: Troponin I: 0.03 ng/mL (ref <0.03)

## 2017-08-09 IMAGING — DX DG CHEST 2V
2 series · 2 of 2 positions shown · non-contrast
Comparison: Prior chest x-ray [DATE]

CLINICAL DATA: 51-year-old female with central chest pain radiating
into the left breast and shortness of breath

EXAM:
CHEST  2 VIEW

[chest pa]
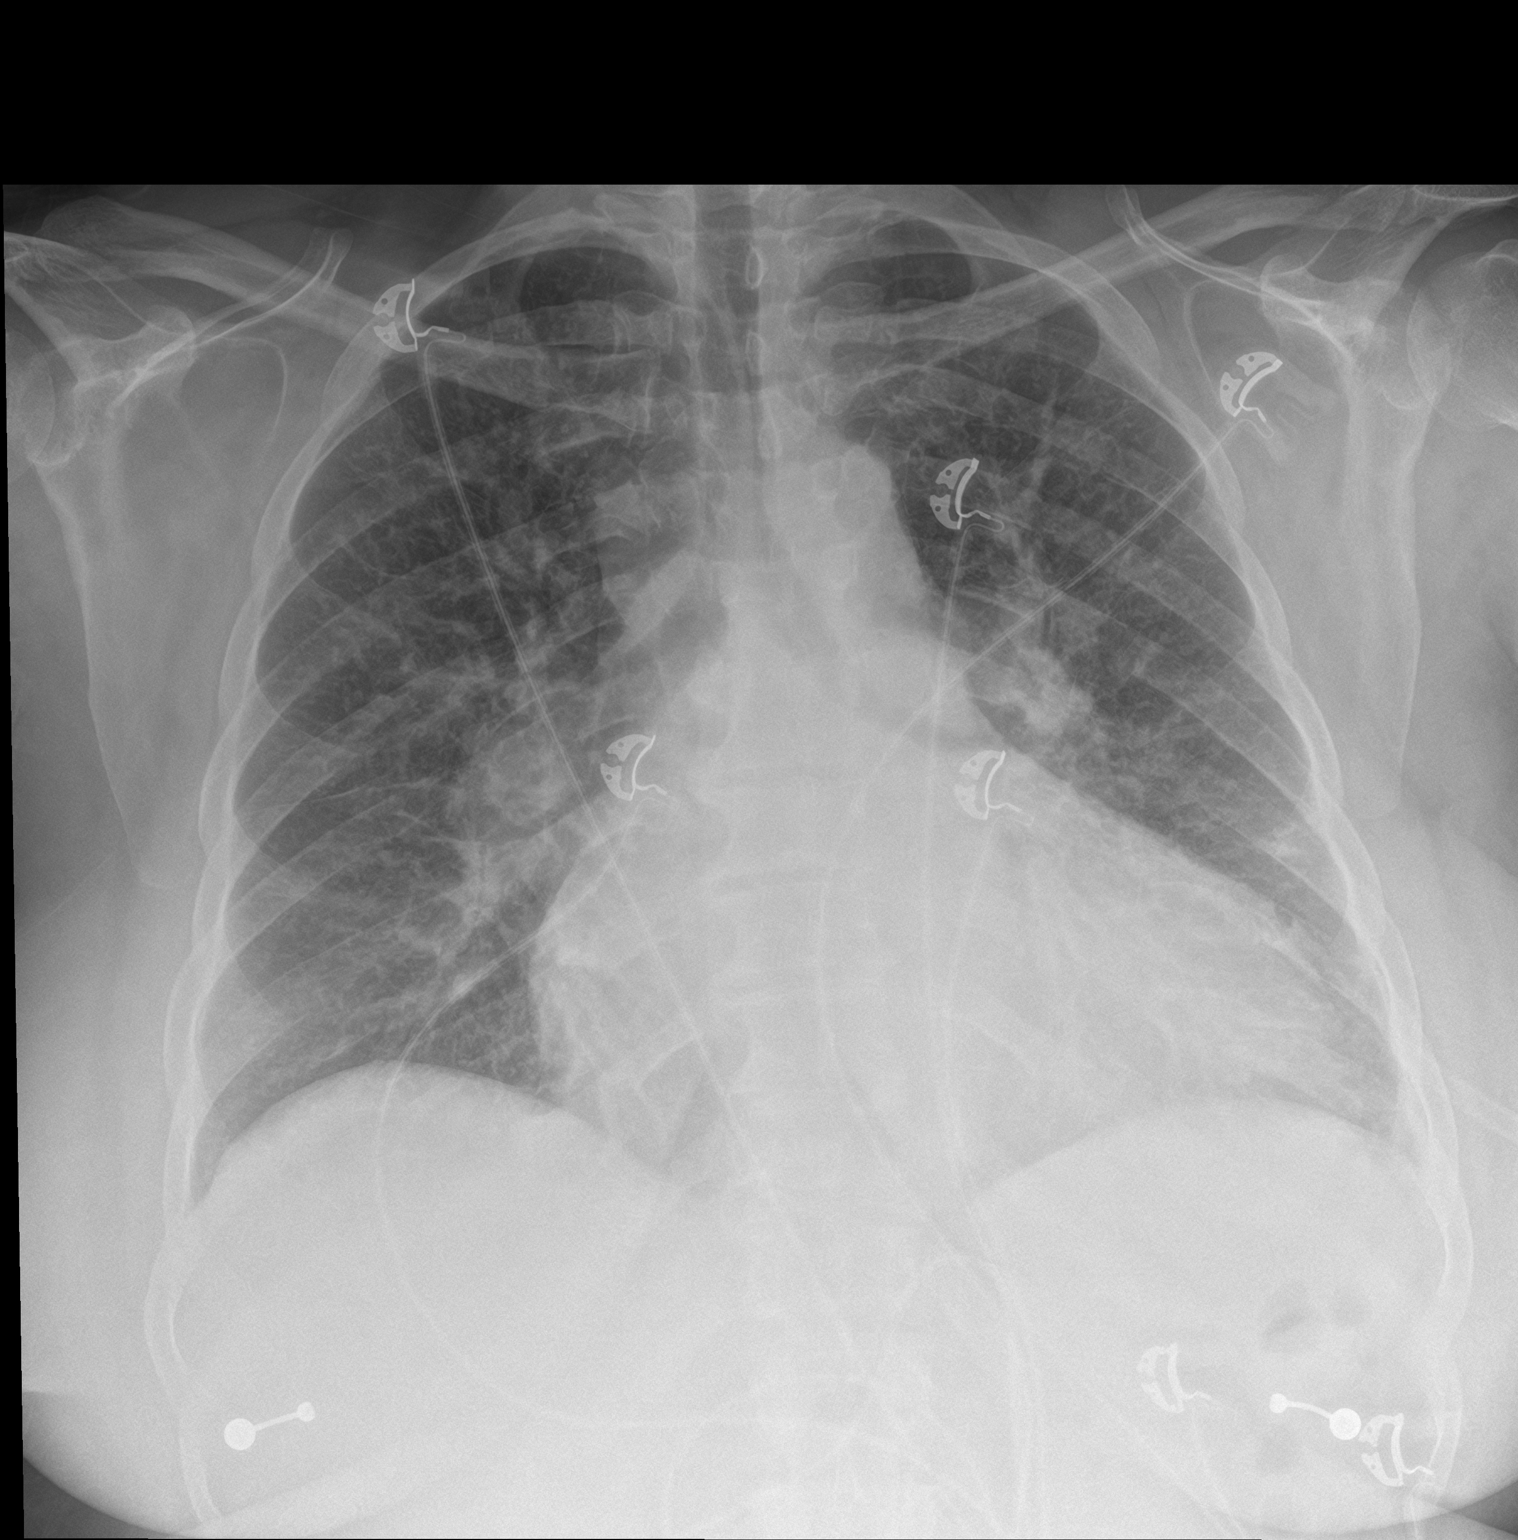

[chest lat]
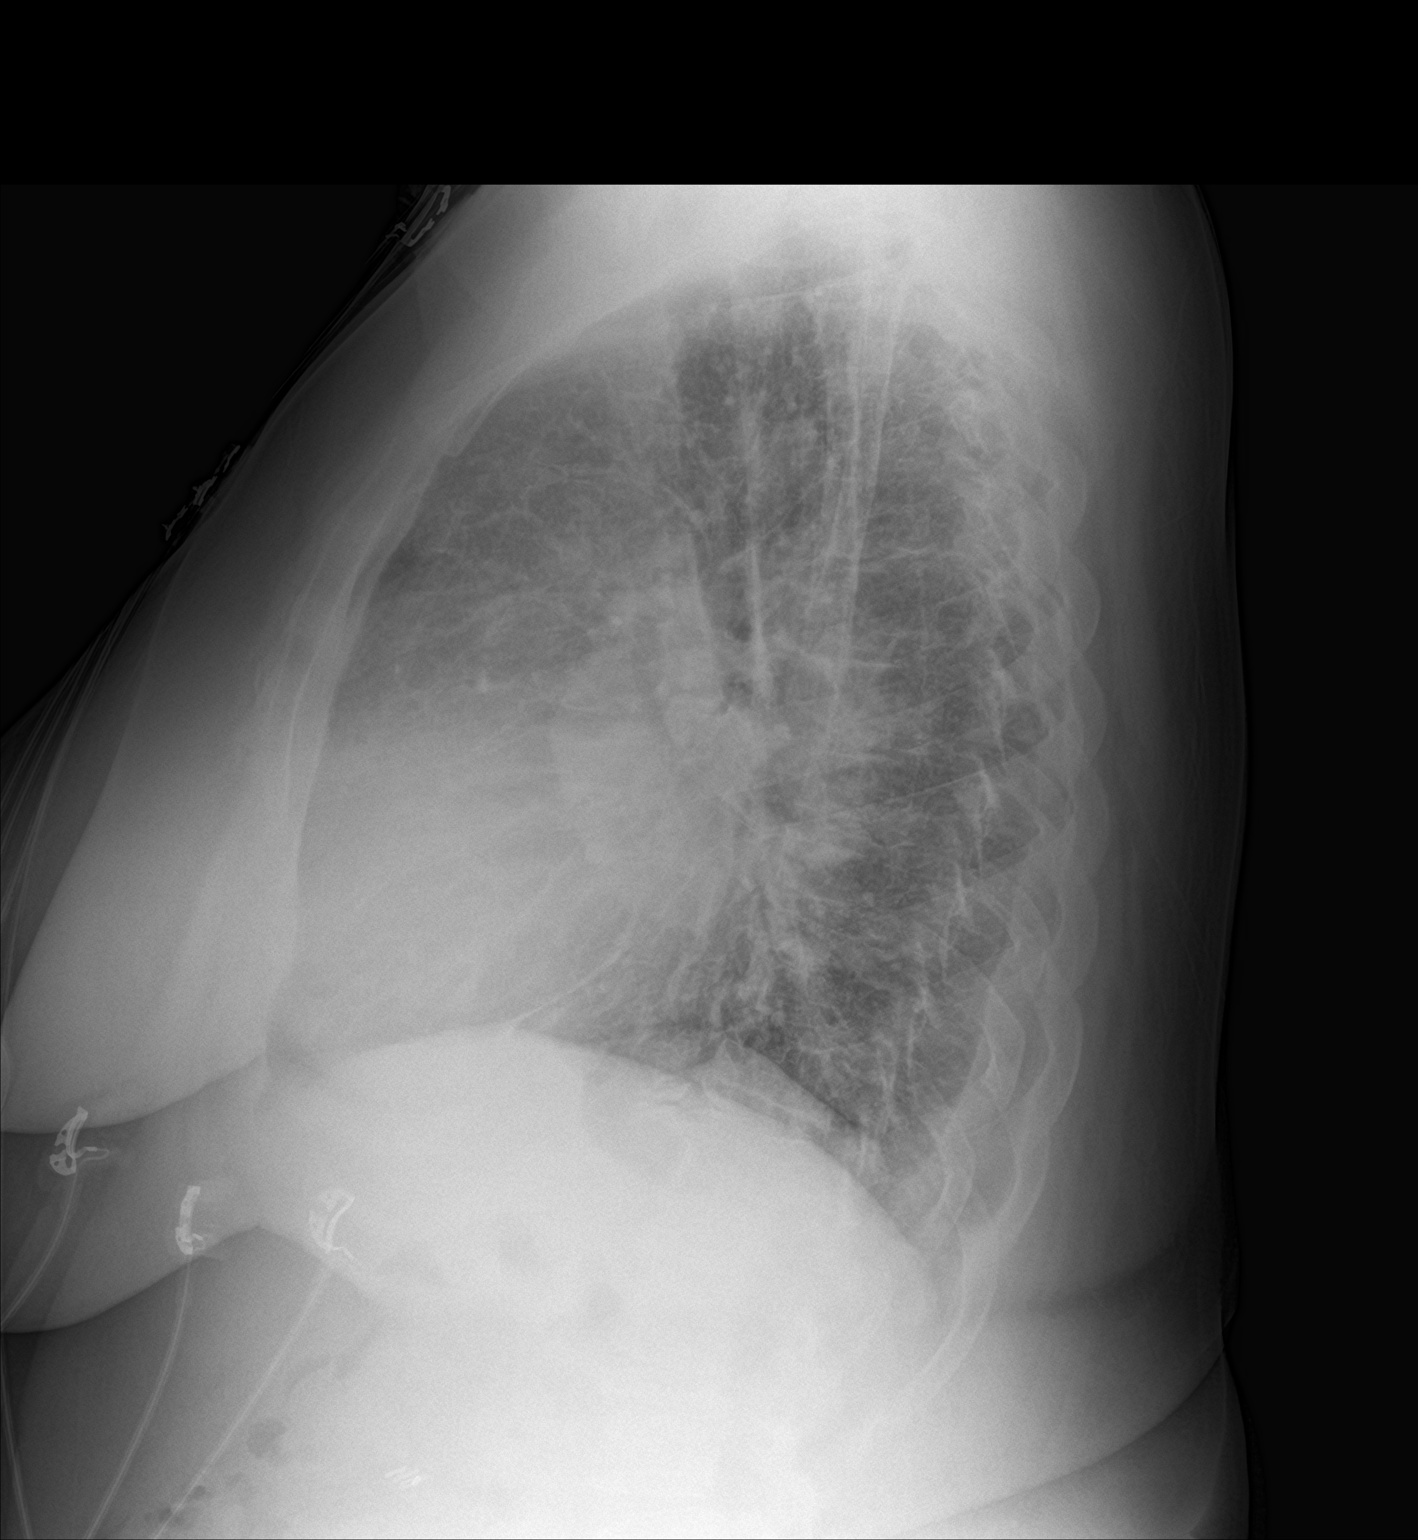

[2 of 2 positions shown; findings below may reference images not displayed]

FINDINGS: Stable cardiomegaly. Mediastinal contours are also similar with
persistent prominence on the right across multiple prior studies
dating back to [9G]. New patchy airspace opacity is present in the
lingula obscuring the left cardiac margin. Pulmonary vascular
congestion without overt edema. No pleural effusion or pneumothorax.
No acute osseous abnormality.
IMPRESSION: 1. Patchy lingular airspace opacity concerning for developing
bronchopneumonia.
2. Stable cardiomegaly and pulmonary vascular congestion without
overt pulmonary edema.

## 2017-08-09 MED ORDER — IPRATROPIUM-ALBUTEROL 0.5-2.5 (3) MG/3ML IN SOLN
3.0000 mL | Freq: Once | RESPIRATORY_TRACT | Status: AC
  Administered 2017-08-09: 3 mL via RESPIRATORY_TRACT
  Filled 2017-08-09: qty 3

## 2017-08-09 MED ORDER — MORPHINE SULFATE (PF) 4 MG/ML IV SOLN
4.0000 mg | Freq: Once | INTRAVENOUS | Status: AC
  Administered 2017-08-09: 4 mg via INTRAVENOUS
  Filled 2017-08-09: qty 1

## 2017-08-09 MED ORDER — DEXTROSE 5 % IV SOLN
1.0000 g | Freq: Once | INTRAVENOUS | Status: AC
  Administered 2017-08-09: 1 g via INTRAVENOUS
  Filled 2017-08-09: qty 10

## 2017-08-09 MED ORDER — ONDANSETRON HCL 4 MG/2ML IJ SOLN
4.0000 mg | Freq: Once | INTRAMUSCULAR | Status: AC
  Administered 2017-08-09: 4 mg via INTRAVENOUS
  Filled 2017-08-09: qty 2

## 2017-08-09 MED ORDER — AZITHROMYCIN 250 MG PO TABS: ORAL_TABLET | ORAL | 0 refills | Status: DC

## 2017-08-09 MED ORDER — DEXTROSE 5 % IV SOLN
500.0000 mg | INTRAVENOUS | Status: DC
  Administered 2017-08-09: 500 mg via INTRAVENOUS
  Filled 2017-08-09: qty 500

## 2017-08-09 NOTE — Discharge Instructions (Signed)
You have a small amount of pneumonia in your left lung.  Rest.  Use your inhalers as needed.  Prescription for antibiotic to start tomorrow [Only 4 more doses].  Follow-up your primary care doctor.

## 2017-08-09 NOTE — ED Notes (Signed)
Pt also reports no bm in 3 days.

## 2017-08-09 NOTE — ED Triage Notes (Signed)
Pt reports left side chest pain, swelling in abd and legs, and sob x 2 days.  Reports history of chf.

## 2017-08-09 NOTE — ED Provider Notes (Signed)
[facility] EMERGENCY DEPARTMENT Provider Note   CSN: [id number] Arrival date & time: 08/09/17  0830     History   Chief Complaint Chief Complaint  Patient presents with  . Shortness of Breath    HPI Robin [name] is a 52 y.o. female.  Patient presents with dyspnea with exertion, cough, left upper quadrant abdominal pain.  No crushing substernal chest pain, diaphoresis, nausea.  Past medical history includes COPD, CHF, diabetes, hypertension.  She is able to get around her house appropriately.  Severity of symptoms is moderate.        Past Medical History:  Diagnosis Date  . Arthritis   . CHF (congestive heart failure) ([place])   . Chronic abdominal pain   . COPD (chronic obstructive pulmonary disease) ([place])   . Diabetes mellitus   . Essential hypertension, benign   . GERD (gastroesophageal reflux disease)   . Gout   . Gout 2016  . Normal coronary arteries    3/10 - following abnormal Myoview  . Ovarian cyst   . Type 2 diabetes mellitus [facility])     Patient Active Problem List   Diagnosis Date Noted  . Acute diastolic CHF (congestive heart failure) ([place]) 07/12/2016  . Diabetes mellitus due to underlying condition, uncontrolled, with hyperglycemia, without long-term current use of insulin ([place]) 07/12/2016  . Hypokalemia 07/12/2016  . CHF (congestive heart failure) ([place]) 07/09/2016  . Uncontrolled type 2 diabetes mellitus with complication ([place]) 46/50/3546  . Essential hypertension, benign 06/07/2015  . Cigarette nicotine dependence, uncomplicated 56/81/2751  . Obesity, unspecified 06/07/2015  . Abdominal pain 07/03/2011  . Pulmonary edema 06/07/2011    Class: Acute  . Hypertensive emergency 06/07/2011  . Respiratory failure with hypoxia ([place]) 06/07/2011  . GERD (gastroesophageal reflux disease) 06/07/2011  . Hemoptysis 06/07/2011  . COPD (chronic obstructive pulmonary disease) ([place]) 06/07/2011  . Arthritis 06/07/2011  . Nicotine abuse 06/07/2011  . Obesity  06/07/2011    Past Surgical History:  Procedure Laterality Date  . ABDOMINAL HYSTERECTOMY  09/10/2011   Procedure: HYSTERECTOMY ABDOMINAL;  Surgeon: Jonnie Kind, MD;  Location: [facility] ORS;  Service: Gynecology;  Laterality: N/A;  Abdominal hysterectomy  . CESAREAN SECTION  [id number], and 1994  . CHOLECYSTECTOMY  1995  . SCAR REVISION  09/10/2011   Procedure: SCAR REVISION;  Surgeon: Jonnie Kind, MD;  Location: [facility]S;  Service: Gynecology;  Laterality: N/A;  Wide Excision of old Cicatrix  . TUBAL LIGATION  1994    OB History    Gravida Para Term Preterm AB Living             3   SAB TAB Ectopic Multiple Live Births                   Home Medications    Prior to Admission medications   Medication Sig Start Date End Date Taking? Authorizing Provider  Alcohol Swabs (ALCOHOL PADS) 70 % PADS 1 application by Does not apply route 2 (two) times daily. 07/12/16  Yes Dhungel, Nishant, MD  aspirin EC 81 MG tablet Take 1 tablet (81 mg total) by mouth daily. 07/12/16  Yes Dhungel, Nishant, MD  blood glucose meter kit and supplies KIT Dispense based on patient and insurance preference. Use up to four times daily as directed. (FOR ICD-9 250.00, 250.01). 07/12/16  Yes Dhungel, Nishant, MD  furosemide (LASIX) 20 MG tablet Take 20 mg by mouth as needed.   Yes [provider]  HYDROcodone-acetaminophen (NORCO/VICODIN) 5-325 MG tablet  Take 1-2 tablets by mouth every 4 (four) hours as needed. 08/19/16  Yes Davonna Belling, MD  ibuprofen (ADVIL,MOTRIN) 600 MG tablet Take 600 mg by mouth every 6 (six) hours as needed for mild pain or moderate pain.    Yes [provider]  insulin NPH-regular Human (NOVOLIN 70/30) (70-30) 100 UNIT/ML injection Inject 15 Units into the skin 2 (two) times daily with a meal. 07/12/16  Yes Dhungel, Nishant, MD  Insulin Syringe-Needle U-100 (INSULIN SYRINGE 1CC/28G) 28G X 1/2" 1 ML MISC 1 application by Does not apply route 2 (two) times daily. 07/12/16  Yes  Dhungel, Nishant, MD  metFORMIN (GLUCOPHAGE) 1000 MG tablet Take 1 tablet (1,000 mg total) by mouth 2 (two) times daily with a meal. 07/12/16  Yes Dhungel, Nishant, MD  metoprolol (LOPRESSOR) 50 MG tablet Take 1 tablet (50 mg total) by mouth 2 (two) times daily. 07/12/16  Yes Dhungel, Nishant, MD  omeprazole (PRILOSEC) 20 MG capsule Take 20 mg by mouth daily.   Yes [provider]  azithromycin (ZITHROMAX) 250 MG tablet 1 daily starting tomorrow afternoon for 4 days 08/09/17   Nat Christen, MD  lisinopril-hydrochlorothiazide (PRINZIDE,ZESTORETIC) 20-12.5 MG tablet Take 2 tablets by mouth daily. Patient not taking: Reported on 08/09/2017 07/12/16   Dhungel, Flonnie Overman, MD  traMADol (ULTRAM) 50 MG tablet Take 1 tablet (50 mg total) by mouth every 6 (six) hours as needed for severe pain. Patient not taking: Reported on 08/09/2017 04/19/17   Duffy Bruce, MD    Family History Family History  Problem Relation Age of Onset  . Cirrhosis Mother   . Diabetes type II Father   . Diabetes type II Sister   . Anesthesia problems Neg Hx   . Hypotension Neg Hx   . Malignant hyperthermia Neg Hx   . Pseudochol deficiency Neg Hx     Social History Social History   Tobacco Use  . Smoking status: Former Smoker    Packs/day: 0.25    Years: 29.00    Pack years: 7.25    Types: Cigarettes    Last attempt to quit: 08/31/2015    Years since quitting: 1.9  . Smokeless tobacco: Never Used  . Tobacco comment: 1 cigarette every 3 days  Substance Use Topics  . Alcohol use: No    Alcohol/week: 0.0 oz  . Drug use: No     Allergies   Bee venom; Naproxen; and Penicillins   Review of Systems Review of Systems  All other systems reviewed and are negative.    Physical Exam Updated Vital Signs BP (!) 142/100   Pulse 88   Temp 98.3 F (36.8 C) (Oral)   Resp 20   Ht _0  (1.676 m)   Wt 96.2 kg (212 lb)   LMP 08/21/2011   SpO2 98%   BMI 34.22 kg/m   Physical Exam  Constitutional: She is  oriented to [place], place, and time.  nad  HENT:  Head: Normocephalic and atraumatic.  Eyes: Conjunctivae are normal.  Neck: Neck supple.  Cardiovascular: Normal rate and regular rhythm.  Pulmonary/Chest: Effort normal and breath sounds normal.  Abdominal: Soft. Bowel sounds are normal.  Musculoskeletal: Normal range of motion.  Neurological: She is alert and oriented to [place], place, and time.  Skin: Skin is warm and dry.  Psychiatric: She has a normal mood and affect. Her behavior is normal.  Nursing note and vitals reviewed.    ED Treatments / Results  Labs (all labs ordered are listed, but only abnormal  results are displayed) Labs Reviewed  BASIC METABOLIC PANEL - Abnormal; Notable for the following components:      Result Value   Potassium 3.2 (*)    Glucose, Bld 360 (*)    All other components within normal limits  HEPATIC FUNCTION PANEL - Abnormal; Notable for the following components:   AST 42 (*)    Total Bilirubin 1.5 (*)    Indirect Bilirubin 1.0 (*)    All other components within normal limits  TROPONIN I - Abnormal; Notable for the following components:   Troponin I 0.03 (*)    All other components within normal limits  CBC WITH DIFFERENTIAL/PLATELET  LIPASE, BLOOD  I-STAT TROPONIN, ED    EKG  EKG Interpretation None      Date: 08/09/2017  Rate: 82  Rhythm: normal sinus rhythm  QRS Axis: normal  Intervals: normal  ST/T Wave abnormalities: normal  Conduction Disutrbances: none  Narrative Interpretation: unremarkable     Radiology Dg Chest 2 View  Result Date: 08/09/2017 CLINICAL DATA:  52 year old female with central chest pain radiating into the left breast and shortness of breath EXAM: CHEST  2 VIEW COMPARISON:  Prior chest x-ray 04/19/2017 FINDINGS: Stable cardiomegaly. Mediastinal contours are also similar with persistent prominence on the right across multiple prior studies dating back to 2015. New patchy airspace opacity is present in the  lingula obscuring the left cardiac margin. Pulmonary vascular congestion without overt edema. No pleural effusion or pneumothorax. No acute osseous abnormality. IMPRESSION: 1. Patchy lingular airspace opacity concerning for developing bronchopneumonia. 2. Stable cardiomegaly and pulmonary vascular congestion without overt pulmonary edema. Electronically Signed   By: Jacqulynn Cadet M.D.   On: 08/09/2017 10:06    Procedures Procedures (including critical care time)  Medications Ordered in ED Medications  ipratropium-albuterol (DUONEB) 0.5-2.5 (3) MG/3ML nebulizer solution 3 mL (3 mLs Nebulization Given 08/09/17 1050)  morphine 4 MG/ML injection 4 mg (4 mg Intravenous Given 08/09/17 1048)  ondansetron (ZOFRAN) injection 4 mg (4 mg Intravenous Given 08/09/17 1048)  cefTRIAXone (ROCEPHIN) 1 g in dextrose 5 % 50 mL IVPB (0 g Intravenous Stopped 08/09/17 1204)     Initial Impression / Assessment and Plan / ED Course  I have reviewed the triage vital signs and the nursing notes.  Pertinent labs & imaging results that were available during my care of the patient were reviewed by me and considered in my medical decision making (see chart for details).     Patient is in no acute distress.  Chest x-ray reveals a patchy lingular airspace opacity concerning for bronchopneumonia.  Will Rx Zithromax.  Discussed with patient.  Final Clinical Impressions(s) / ED Diagnoses   Final diagnoses:  Community acquired pneumonia of left lung, unspecified part of lung    ED Discharge Orders        Ordered    azithromycin (ZITHROMAX) 250 MG tablet     08/09/17 1549       Nat Christen, MD 08/10/17 2249

## 2017-08-11 ENCOUNTER — Emergency Department (HOSPITAL_COMMUNITY)
Admission: EM | Admit: 2017-08-11 | Discharge: 2017-08-11 | Disposition: A | Discharge status: Home / Self Care | Payer: Self-pay | Attending: Emergency Medicine | Admitting: Emergency Medicine

## 2017-08-11 DIAGNOSIS — Z5321 Procedure and treatment not carried out due to patient leaving prior to being seen by health care provider: Secondary | ICD-10-CM | POA: Insufficient documentation

## 2017-08-11 DIAGNOSIS — R0600 Dyspnea, unspecified: Secondary | ICD-10-CM | POA: Insufficient documentation

## 2017-08-11 NOTE — ED Notes (Signed)
Not in waiting room

## 2017-08-11 NOTE — ED Notes (Signed)
Pt not in waiting room

## 2017-08-13 ENCOUNTER — Encounter (HOSPITAL_COMMUNITY): Payer: Self-pay | Admitting: Emergency Medicine

## 2017-08-13 ENCOUNTER — Emergency Department (HOSPITAL_COMMUNITY)
Admission: EM | Admit: 2017-08-13 | Discharge: 2017-08-13 | Disposition: A | Discharge status: Home / Self Care | Payer: Self-pay | Attending: Emergency Medicine | Admitting: Emergency Medicine

## 2017-08-13 ENCOUNTER — Emergency Department (HOSPITAL_COMMUNITY): Payer: Self-pay

## 2017-08-13 ENCOUNTER — Other Ambulatory Visit: Payer: Self-pay

## 2017-08-13 DIAGNOSIS — I5030 Unspecified diastolic (congestive) heart failure: Secondary | ICD-10-CM | POA: Insufficient documentation

## 2017-08-13 DIAGNOSIS — E119 Type 2 diabetes mellitus without complications: Secondary | ICD-10-CM | POA: Insufficient documentation

## 2017-08-13 DIAGNOSIS — J449 Chronic obstructive pulmonary disease, unspecified: Secondary | ICD-10-CM | POA: Insufficient documentation

## 2017-08-13 DIAGNOSIS — R739 Hyperglycemia, unspecified: Secondary | ICD-10-CM | POA: Insufficient documentation

## 2017-08-13 DIAGNOSIS — Z79899 Other long term (current) drug therapy: Secondary | ICD-10-CM | POA: Insufficient documentation

## 2017-08-13 DIAGNOSIS — J40 Bronchitis, not specified as acute or chronic: Secondary | ICD-10-CM | POA: Insufficient documentation

## 2017-08-13 DIAGNOSIS — Z87891 Personal history of nicotine dependence: Secondary | ICD-10-CM | POA: Insufficient documentation

## 2017-08-13 DIAGNOSIS — Z794 Long term (current) use of insulin: Secondary | ICD-10-CM | POA: Insufficient documentation

## 2017-08-13 DIAGNOSIS — I110 Hypertensive heart disease with heart failure: Secondary | ICD-10-CM | POA: Insufficient documentation

## 2017-08-13 DIAGNOSIS — Z7982 Long term (current) use of aspirin: Secondary | ICD-10-CM | POA: Insufficient documentation

## 2017-08-13 DIAGNOSIS — R0602 Shortness of breath: Secondary | ICD-10-CM | POA: Insufficient documentation

## 2017-08-13 LAB — CBC WITH DIFFERENTIAL/PLATELET
Basophils Absolute: 0 10*3/uL (ref 0.0–0.1)
Basophils Relative: 1 %
Eosinophils Absolute: 0.2 10*3/uL (ref 0.0–0.7)
Eosinophils Relative: 3 %
HCT: 40.6 % (ref 36.0–46.0)
Hemoglobin: 13.1 g/dL (ref 12.0–15.0)
Lymphocytes Relative: 31 %
Lymphs Abs: 1.8 10*3/uL (ref 0.7–4.0)
MCH: 27.2 pg (ref 26.0–34.0)
MCHC: 32.3 g/dL (ref 30.0–36.0)
MCV: 84.2 fL (ref 78.0–100.0)
Monocytes Absolute: 0.4 10*3/uL (ref 0.1–1.0)
Monocytes Relative: 7 %
Neutro Abs: 3.4 10*3/uL (ref 1.7–7.7)
Neutrophils Relative %: 58 %
Platelets: 259 10*3/uL (ref 150–400)
RBC: 4.82 MIL/uL (ref 3.87–5.11)
RDW: 13.7 % (ref 11.5–15.5)
WBC: 5.8 10*3/uL (ref 4.0–10.5)

## 2017-08-13 LAB — CBG MONITORING, ED: Glucose-Capillary: 240 mg/dL — ABNORMAL HIGH (ref 65–99)

## 2017-08-13 LAB — BASIC METABOLIC PANEL
Anion gap: 11 (ref 5–15)
BUN: 12 mg/dL (ref 6–20)
CO2: 23 mmol/L (ref 22–32)
Calcium: 8.8 mg/dL — ABNORMAL LOW (ref 8.9–10.3)
Chloride: 101 mmol/L (ref 101–111)
Creatinine, Ser: 0.76 mg/dL (ref 0.44–1.00)
GFR calc Af Amer: >60 mL/min (ref >60)
GFR calc non Af Amer: >60 mL/min (ref >60)
Glucose, Bld: 444 mg/dL — ABNORMAL HIGH (ref 65–99)
Potassium: 3.7 mmol/L (ref 3.5–5.1)
Sodium: 135 mmol/L (ref 135–145)

## 2017-08-13 IMAGING — DX DG CHEST 2V
2 series · 2 of 2 positions shown · non-contrast
Comparison: [DATE]

CLINICAL DATA: Pneumonia. Continued cough, dyspnea, and fatigue-
patient seen on [REDACTED] for same thing and was diagnosed with
pneumonia at that time. Hx of CHF, COPD, diabetes. Former smoker.

EXAM:
CHEST  2 VIEW

[chest pa]
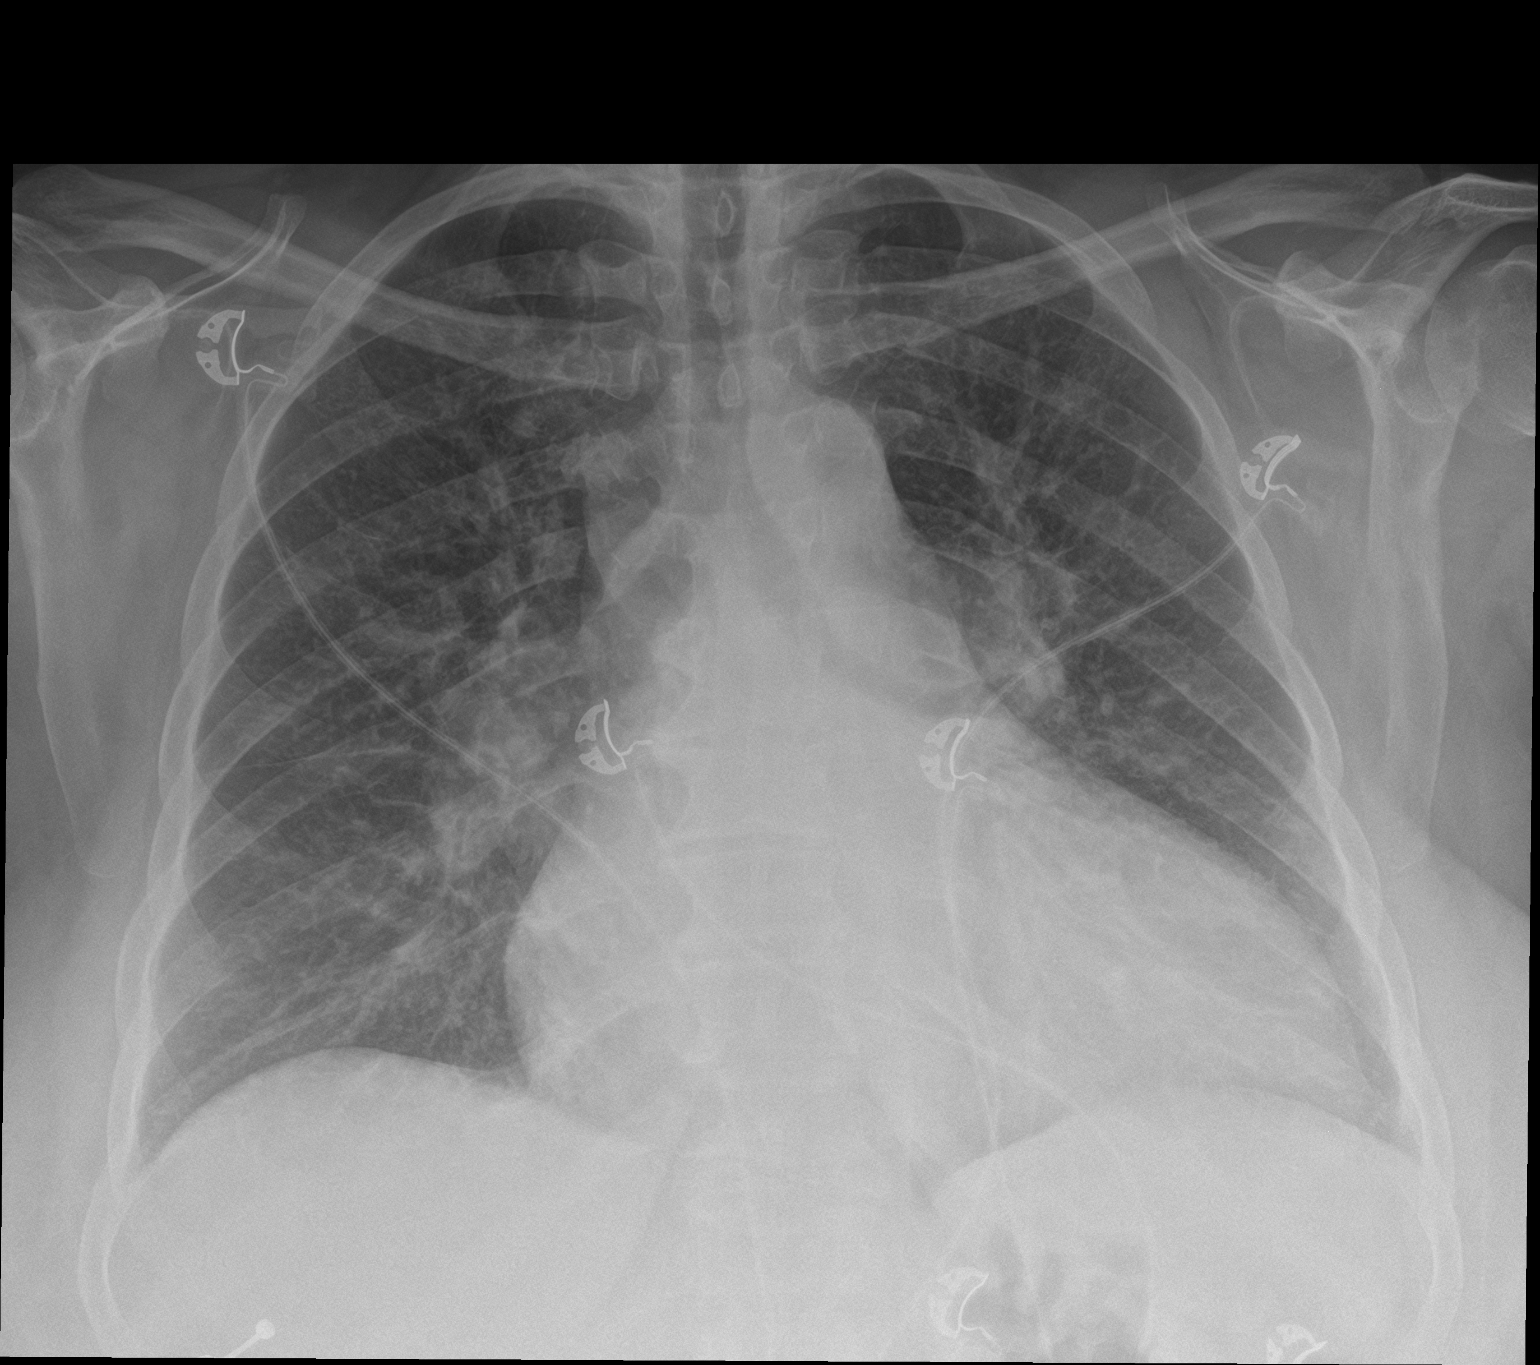

[chest lat]
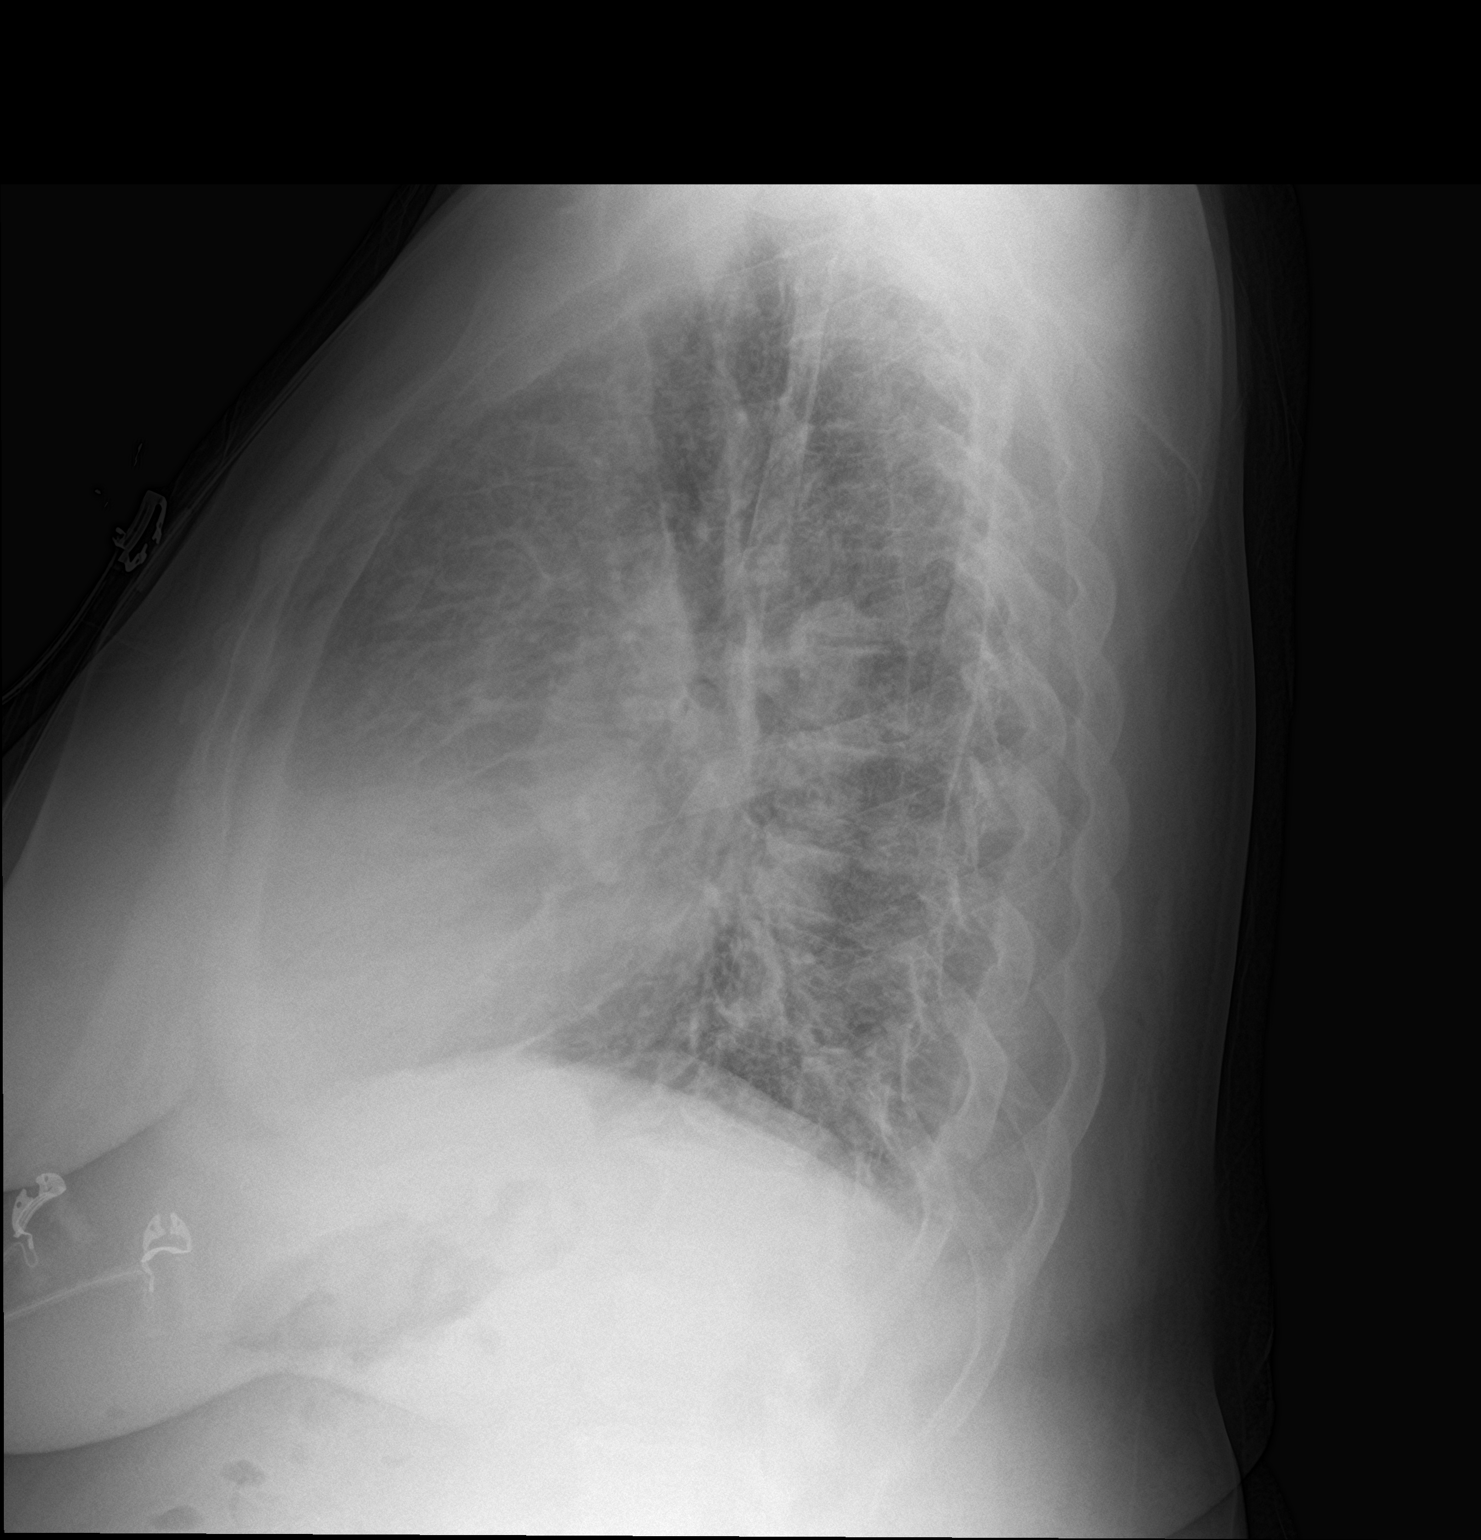

[2 of 2 positions shown; findings below may reference images not displayed]

FINDINGS: The cardiac silhouette is mildly enlarged. There is prominence of
both pulmonary arteries. These findings are stable. No mediastinal
hilar masses. No convincing adenopathy.

Areas of patchy lingular airspace opacity noted on the prior study
have mostly resolved. There are prominent bronchovascular markings
bilaterally, which are most evident in the lower lobes at the lung
bases, similar to the prior exam and 2 older studies. There are no
new lung abnormalities.

No pleural effusion or pneumothorax.

Skeletal structures are intact.
IMPRESSION: 1. Small patchy areas of left upper lobe lingular opacity noted on
the most recent prior study have improved. There is no convincing
pneumonia on the current exam.
2. There is cardiomegaly with prominent bronchovascular markings
bilaterally, findings which are stable from prior exams. No overt
pulmonary edema.
3. Prominence of the pulmonary arteries raises the possibility of
pulmonary hypertension.

## 2017-08-13 MED ORDER — PREDNISONE 10 MG PO TABS: 40.0000 mg | ORAL_TABLET | Freq: Every day | ORAL | 0 refills | Status: DC

## 2017-08-13 MED ORDER — INSULIN REGULAR BOLUS VIA INFUSION: 10.0000 [IU] | Freq: Once | INTRAVENOUS | Status: DC

## 2017-08-13 MED ORDER — PREDNISONE 10 MG PO TABS
60.0000 mg | ORAL_TABLET | Freq: Once | ORAL | Status: AC
  Administered 2017-08-13: 60 mg via ORAL
  Filled 2017-08-13: qty 1

## 2017-08-13 MED ORDER — HYDROCODONE-ACETAMINOPHEN 5-325 MG PO TABS
1.0000 | ORAL_TABLET | Freq: Once | ORAL | Status: AC
  Administered 2017-08-13: 1 via ORAL
  Filled 2017-08-13: qty 1

## 2017-08-13 MED ORDER — IPRATROPIUM-ALBUTEROL 0.5-2.5 (3) MG/3ML IN SOLN
3.0000 mL | Freq: Once | RESPIRATORY_TRACT | Status: AC
  Administered 2017-08-13: 3 mL via RESPIRATORY_TRACT
  Filled 2017-08-13: qty 3

## 2017-08-13 MED ORDER — INSULIN ASPART 100 UNIT/ML ~~LOC~~ SOLN
10.0000 [IU] | Freq: Once | SUBCUTANEOUS | Status: AC
  Administered 2017-08-13: 10 [IU] via INTRAVENOUS
  Filled 2017-08-13: qty 1

## 2017-08-13 NOTE — ED Provider Notes (Signed)
[facility] EMERGENCY DEPARTMENT Provider Note   CSN: [id number] Arrival date & time: 08/13/17  0745     History   Chief Complaint Chief Complaint  Patient presents with  . Cough    HPI Robin [name] is a 52 y.o. female.  Patient was seen January 5 diagnosed with community-acquired pneumonia received IV Rocephin and IV Zithromax and sent home on Zithromax.  Patient's been taking her antibiotics.  Patient feels as if she is getting worse.  She is got some increased shortness of breath.  Persistent cough.  Patient also has a history of CHF and COPD.  Patient does have albuterol inhaler at home.  She has been using it every 6 hours.      Past Medical History:  Diagnosis Date  . Arthritis   . CHF (congestive heart failure) ([place])   . Chronic abdominal pain   . COPD (chronic obstructive pulmonary disease) ([place])   . Diabetes mellitus   . Essential hypertension, benign   . GERD (gastroesophageal reflux disease)   . Gout   . Gout 2016  . Normal coronary arteries    3/10 - following abnormal Myoview  . Ovarian cyst   . Type 2 diabetes mellitus [facility])     Patient Active Problem List   Diagnosis Date Noted  . Acute diastolic CHF (congestive heart failure) ([place]) 07/12/2016  . Diabetes mellitus due to underlying condition, uncontrolled, with hyperglycemia, without long-term current use of insulin ([place]) 07/12/2016  . Hypokalemia 07/12/2016  . CHF (congestive heart failure) ([place]) 07/09/2016  . Uncontrolled type 2 diabetes mellitus with complication ([place]) 88/82/8003  . Essential hypertension, benign 06/07/2015  . Cigarette nicotine dependence, uncomplicated 49/17/9150  . Obesity, unspecified 06/07/2015  . Abdominal pain 07/03/2011  . Pulmonary edema 06/07/2011    Class: Acute  . Hypertensive emergency 06/07/2011  . Respiratory failure with hypoxia ([place]) 06/07/2011  . GERD (gastroesophageal reflux disease) 06/07/2011  . Hemoptysis 06/07/2011  . COPD (chronic obstructive  pulmonary disease) ([place]) 06/07/2011  . Arthritis 06/07/2011  . Nicotine abuse 06/07/2011  . Obesity 06/07/2011    Past Surgical History:  Procedure Laterality Date  . ABDOMINAL HYSTERECTOMY  09/10/2011   Procedure: HYSTERECTOMY ABDOMINAL;  Surgeon: Jonnie Kind, MD;  Location: [facility] ORS;  Service: Gynecology;  Laterality: N/A;  Abdominal hysterectomy  . CESAREAN SECTION  [id number], and 1994  . CHOLECYSTECTOMY  1995  . SCAR REVISION  09/10/2011   Procedure: SCAR REVISION;  Surgeon: Jonnie Kind, MD;  Location: [facility] ORS;  Service: Gynecology;  Laterality: N/A;  Wide Excision of old Cicatrix  . TUBAL LIGATION  1994    OB History    Gravida Para Term Preterm AB Living             3   SAB TAB Ectopic Multiple Live Births                   Home Medications    Prior to Admission medications   Medication Sig Start Date End Date Taking? Authorizing Provider  albuterol (PROVENTIL HFA;VENTOLIN HFA) 108 (90 Base) MCG/ACT inhaler Inhale 1-2 puffs into the lungs every 6 (six) hours as needed for wheezing or shortness of breath.   Yes [provider]  aspirin EC 81 MG tablet Take 1 tablet (81 mg total) by mouth daily. 07/12/16  Yes Dhungel, Nishant, MD  azithromycin (ZITHROMAX) 250 MG tablet 1 daily starting tomorrow afternoon for 4 days 08/09/17  Yes Nat Christen, MD  furosemide (LASIX) 20  MG tablet Take 20 mg by mouth as needed for fluid.    Yes [provider]  insulin NPH-regular Human (NOVOLIN 70/30) (70-30) 100 UNIT/ML injection Inject 15 Units into the skin 2 (two) times daily with a meal. 07/12/16  Yes Dhungel, Nishant, MD  metFORMIN (GLUCOPHAGE) 1000 MG tablet Take 1 tablet (1,000 mg total) by mouth 2 (two) times daily with a meal. 07/12/16  Yes Dhungel, Nishant, MD  metoprolol (LOPRESSOR) 50 MG tablet Take 1 tablet (50 mg total) by mouth 2 (two) times daily. 07/12/16  Yes Dhungel, Nishant, MD  omeprazole (PRILOSEC) 20 MG capsule Take 20 mg by mouth daily.   Yes [provider]  Alcohol Swabs (ALCOHOL PADS) 70 % PADS 1 application by Does not apply route 2 (two) times daily. 07/12/16   Dhungel, Flonnie Overman, MD  blood glucose meter kit and supplies KIT Dispense based on patient and insurance preference. Use up to four times daily as directed. (FOR ICD-9 250.00, 250.01). 07/12/16   Dhungel, Flonnie Overman, MD  HYDROcodone-acetaminophen (NORCO/VICODIN) 5-325 MG tablet Take 1-2 tablets by mouth every 4 (four) hours as needed. Patient not taking: Reported on 08/13/2017 08/19/16   Davonna Belling, MD  Insulin Syringe-Needle U-100 (INSULIN SYRINGE 1CC/28G) 28G X 1/2" 1 ML MISC 1 application by Does not apply route 2 (two) times daily. 07/12/16   Dhungel, Flonnie Overman, MD  lisinopril-hydrochlorothiazide (PRINZIDE,ZESTORETIC) 20-12.5 MG tablet Take 2 tablets by mouth daily. Patient not taking: Reported on 08/09/2017 07/12/16   Dhungel, Flonnie Overman, MD  predniSONE (DELTASONE) 10 MG tablet Take 4 tablets (40 mg total) by mouth daily. 08/13/17   Fredia Sorrow, MD  traMADol (ULTRAM) 50 MG tablet Take 1 tablet (50 mg total) by mouth every 6 (six) hours as needed for severe pain. Patient not taking: Reported on 08/09/2017 04/19/17   Duffy Bruce, MD    Family History Family History  Problem Relation Age of Onset  . Cirrhosis Mother   . Diabetes type II Father   . Diabetes type II Sister   . Anesthesia problems Neg Hx   . Hypotension Neg Hx   . Malignant hyperthermia Neg Hx   . Pseudochol deficiency Neg Hx     Social History Social History   Tobacco Use  . Smoking status: Former Smoker    Packs/day: 0.25    Years: 29.00    Pack years: 7.25    Types: Cigarettes    Last attempt to quit: 08/31/2015    Years since quitting: 1.9  . Smokeless tobacco: Never Used  . Tobacco comment: 1 cigarette every 3 days  Substance Use Topics  . Alcohol use: No    Alcohol/week: 0.0 oz  . Drug use: No     Allergies   Bee venom; Naproxen; and Penicillins   Review of Systems Review of  Systems  Constitutional: Positive for fatigue. Negative for fever.  HENT: Positive for congestion.   Eyes: Negative for redness.  Respiratory: Positive for cough and shortness of breath.   Cardiovascular: Positive for chest pain.  Gastrointestinal: Negative for abdominal pain.  Genitourinary: Negative for dysuria.  Musculoskeletal: Negative for back pain.  Skin: Negative for rash.  Neurological: Positive for weakness.  Hematological: Does not bruise/bleed easily.  Psychiatric/Behavioral: Negative for confusion.     Physical Exam Updated Vital Signs BP (!) 179/111   Pulse 84   Temp 98.1 F (36.7 C) (Oral)   Resp (!) 21   Ht 1.676 m (_0 )   Wt 96.2 kg (212 lb)  LMP 08/21/2011   SpO2 98%   BMI 34.22 kg/m   Physical Exam  Constitutional: She is oriented to [place], place, [name] No distress.  HENT:  Head: Normocephalic.  Mouth/Throat: Oropharynx is clear and moist.  Eyes: Conjunctivae and EOM are normal. Pupils are equal, round, and reactive to light.  Neck: Normal range of motion. Neck supple.  Cardiovascular: Normal rate.  Pulmonary/Chest: Effort normal and breath sounds normal. No respiratory distress. She has no wheezes. She exhibits no tenderness.  Abdominal: Soft. Bowel sounds are normal. There is no tenderness.  Musculoskeletal: Normal range of motion.  Neurological: She is alert and oriented to [place], place, and time. No cranial nerve deficit or sensory deficit. She exhibits normal muscle tone. Coordination normal.  Skin: Skin is warm.  Nursing note and vitals reviewed.    ED Treatments / Results  Labs (all labs ordered are listed, but only abnormal results are displayed) Labs Reviewed  BASIC METABOLIC PANEL - Abnormal; Notable for the following components:      Result Value   Glucose, Bld 444 (*)    Calcium 8.8 (*)    All other components within normal limits  CBG MONITORING, ED - Abnormal; Notable for  the following components:   Glucose-Capillary 240 (*)    All other components within normal limits  CBC WITH DIFFERENTIAL/PLATELET    EKG  EKG Interpretation None       Radiology Dg Chest 2 View  Result Date: 08/13/2017 CLINICAL DATA:  Pneumonia. Continued cough, dyspnea, and fatigue- patient seen on Saturday for same thing and was diagnosed with pneumonia at that time. Hx of CHF, COPD, diabetes. Former smoker. EXAM: CHEST  2 VIEW COMPARISON:  08/09/2017 FINDINGS: The cardiac silhouette is mildly enlarged. There is prominence of both pulmonary arteries. These findings are stable. No mediastinal hilar masses. No convincing adenopathy. Areas of patchy lingular airspace opacity noted on the prior study have mostly resolved. There are prominent bronchovascular markings bilaterally, which are most evident in the lower lobes at the lung bases, similar to the prior exam and 2 older studies. There are no new lung abnormalities. No pleural effusion or pneumothorax. Skeletal structures are intact. IMPRESSION: 1. Small patchy areas of left upper lobe lingular opacity noted on the most recent prior study have improved. There is no convincing pneumonia on the current exam. 2. There is cardiomegaly with prominent bronchovascular markings bilaterally, findings which are stable from prior exams. No overt pulmonary edema. 3. Prominence of the pulmonary arteries raises the possibility of pulmonary hypertension. Electronically Signed   By: Lajean Manes M.D.   On: 08/13/2017 09:21    Procedures Procedures (including critical care time)  Medications Ordered in ED Medications  ipratropium-albuterol (DUONEB) 0.5-2.5 (3) MG/3ML nebulizer solution 3 mL (3 mLs Nebulization Given 08/13/17 0940)  predniSONE (DELTASONE) tablet 60 mg (60 mg Oral Given 08/13/17 0943)  HYDROcodone-acetaminophen (NORCO/VICODIN) 5-325 MG per tablet 1 tablet (1 tablet Oral Given 08/13/17 0943)  insulin aspart (novoLOG) injection 10 Units (10  Units Intravenous Given 08/13/17 1113)     Initial Impression / Assessment and Plan / ED Course  I have reviewed the triage vital signs and the nursing notes.  Pertinent labs & imaging results that were available during my care of the patient were reviewed by me and considered in my medical decision making (see chart for details).  Clinical Course as of Aug 13 1305  Wed Aug 13, 2017  0623 DG Chest 2 View [MB]  Clinical Course User Index [MB] Milania Nordmann    Initial was concern for worsening of the pneumonia.  Patient's oxygen saturations on room air have been in the upper 90s.  Chest x-ray showed improvement if not resolution of the pneumonia.  Patient was ambulated did not drop her sats.  Patient feels much better after nebulizer treatment.  Patient also given oral prednisone fearing this may be just a bronchitis or an exacerbation of her COPD.  Patient did feel better after these treatments.  Incidental finding was that her blood sugar was very high.  Patient does have diabetes.  Patient given some IV insulin.  Blood sugar came down to the 200 range.  Patient will be discharged home on prednisone continuing her antibiotics continue her albuterol inhaler.  Patient will follow her blood sugars carefully patient was told that the prednisone will tend to increase that.  Final Clinical Impressions(s) / ED Diagnoses   Final diagnoses:  Bronchitis  Hyperglycemia    ED Discharge Orders        Ordered    predniSONE (DELTASONE) 10 MG tablet  Daily     08/13/17 1304       Fredia Sorrow, MD 08/13/17 1312

## 2017-08-13 NOTE — Discharge Instructions (Signed)
Chest x-ray shows improvement for the pneumonia continue your antibiotics as directed.  Continue use your albuterol inhaler every 4-6 hours.  Add on the prednisone for the bronchitis.  Return for any new or worse symptoms.  Carefully follow your blood sugars at home the prednisone will tend to increase that.  Her blood sugars came under better control here today with some IV insulin.

## 2017-08-13 NOTE — ED Triage Notes (Signed)
Pt reports was seen for same on Saturday and was diagnosed with pneumonia. Pt reports continued cough, dyspnea, and fatigue.

## 2017-08-13 NOTE — ED Notes (Signed)
Pt maintained a pulse ox of 96-100%. Seemed lethargic and weak while walking, but stated she does feel better than she did earlier.

## 2017-09-19 ENCOUNTER — Emergency Department (HOSPITAL_COMMUNITY): Payer: Self-pay

## 2017-09-19 ENCOUNTER — Inpatient Hospital Stay (HOSPITAL_COMMUNITY)
Admission: EM | Admit: 2017-09-19 | Discharge: 2017-09-19 | DRG: 291 | Disposition: A | Discharge status: Home / Self Care | Payer: Self-pay | Attending: Emergency Medicine | Admitting: Emergency Medicine

## 2017-09-19 ENCOUNTER — Encounter (HOSPITAL_COMMUNITY): Payer: Self-pay

## 2017-09-19 DIAGNOSIS — Z880 Allergy status to penicillin: Secondary | ICD-10-CM

## 2017-09-19 DIAGNOSIS — Z90710 Acquired absence of both cervix and uterus: Secondary | ICD-10-CM

## 2017-09-19 DIAGNOSIS — R0602 Shortness of breath: Secondary | ICD-10-CM

## 2017-09-19 DIAGNOSIS — R0789 Other chest pain: Secondary | ICD-10-CM

## 2017-09-19 DIAGNOSIS — Z833 Family history of diabetes mellitus: Secondary | ICD-10-CM

## 2017-09-19 DIAGNOSIS — E1165 Type 2 diabetes mellitus with hyperglycemia: Secondary | ICD-10-CM

## 2017-09-19 DIAGNOSIS — Z91030 Bee allergy status: Secondary | ICD-10-CM

## 2017-09-19 DIAGNOSIS — Z9049 Acquired absence of other specified parts of digestive tract: Secondary | ICD-10-CM

## 2017-09-19 DIAGNOSIS — M109 Gout, unspecified: Secondary | ICD-10-CM | POA: Diagnosis present

## 2017-09-19 DIAGNOSIS — R079 Chest pain, unspecified: Secondary | ICD-10-CM

## 2017-09-19 DIAGNOSIS — I5031 Acute diastolic (congestive) heart failure: Secondary | ICD-10-CM | POA: Diagnosis present

## 2017-09-19 DIAGNOSIS — Z794 Long term (current) use of insulin: Secondary | ICD-10-CM

## 2017-09-19 DIAGNOSIS — E118 Type 2 diabetes mellitus with unspecified complications: Secondary | ICD-10-CM

## 2017-09-19 DIAGNOSIS — Z7982 Long term (current) use of aspirin: Secondary | ICD-10-CM

## 2017-09-19 DIAGNOSIS — I10 Essential (primary) hypertension: Secondary | ICD-10-CM

## 2017-09-19 DIAGNOSIS — E669 Obesity, unspecified: Secondary | ICD-10-CM | POA: Diagnosis present

## 2017-09-19 DIAGNOSIS — I110 Hypertensive heart disease with heart failure: Principal | ICD-10-CM | POA: Diagnosis present

## 2017-09-19 DIAGNOSIS — K219 Gastro-esophageal reflux disease without esophagitis: Secondary | ICD-10-CM | POA: Diagnosis present

## 2017-09-19 DIAGNOSIS — Z87891 Personal history of nicotine dependence: Secondary | ICD-10-CM

## 2017-09-19 DIAGNOSIS — Z886 Allergy status to analgesic agent status: Secondary | ICD-10-CM

## 2017-09-19 DIAGNOSIS — J449 Chronic obstructive pulmonary disease, unspecified: Secondary | ICD-10-CM | POA: Diagnosis present

## 2017-09-19 DIAGNOSIS — E119 Type 2 diabetes mellitus without complications: Secondary | ICD-10-CM | POA: Diagnosis present

## 2017-09-19 DIAGNOSIS — Z6834 Body mass index (BMI) 34.0-34.9, adult: Secondary | ICD-10-CM

## 2017-09-19 DIAGNOSIS — M1990 Unspecified osteoarthritis, unspecified site: Secondary | ICD-10-CM | POA: Diagnosis present

## 2017-09-19 LAB — CBC WITH DIFFERENTIAL/PLATELET
Basophils Absolute: 0 10*3/uL (ref 0.0–0.1)
Basophils Relative: 1 %
Eosinophils Absolute: 0.1 10*3/uL (ref 0.0–0.7)
Eosinophils Relative: 1 %
HCT: 43.3 % (ref 36.0–46.0)
Hemoglobin: 13.8 g/dL (ref 12.0–15.0)
Lymphocytes Relative: 33 %
Lymphs Abs: 2.2 10*3/uL (ref 0.7–4.0)
MCH: 26.3 pg (ref 26.0–34.0)
MCHC: 31.9 g/dL (ref 30.0–36.0)
MCV: 82.6 fL (ref 78.0–100.0)
Monocytes Absolute: 0.4 10*3/uL (ref 0.1–1.0)
Monocytes Relative: 6 %
Neutro Abs: 3.9 10*3/uL (ref 1.7–7.7)
Neutrophils Relative %: 60 %
Platelets: 226 10*3/uL (ref 150–400)
RBC: 5.24 MIL/uL — ABNORMAL HIGH (ref 3.87–5.11)
RDW: 14.2 % (ref 11.5–15.5)
WBC: 6.5 10*3/uL (ref 4.0–10.5)

## 2017-09-19 LAB — COMPREHENSIVE METABOLIC PANEL
ALT: 24 U/L (ref 14–54)
AST: 27 U/L (ref 15–41)
Albumin: 3.5 g/dL (ref 3.5–5.0)
Alkaline Phosphatase: 119 U/L (ref 38–126)
Anion gap: 11 (ref 5–15)
BUN: 11 mg/dL (ref 6–20)
CO2: 24 mmol/L (ref 22–32)
Calcium: 9 mg/dL (ref 8.9–10.3)
Chloride: 104 mmol/L (ref 101–111)
Creatinine, Ser: 0.81 mg/dL (ref 0.44–1.00)
GFR calc Af Amer: >60 mL/min (ref >60)
GFR calc non Af Amer: >60 mL/min (ref >60)
Glucose, Bld: 379 mg/dL — ABNORMAL HIGH (ref 65–99)
Potassium: 3 mmol/L — ABNORMAL LOW (ref 3.5–5.1)
Sodium: 139 mmol/L (ref 135–145)
Total Bilirubin: 0.8 mg/dL (ref 0.3–1.2)
Total Protein: 6.7 g/dL (ref 6.5–8.1)

## 2017-09-19 LAB — LIPASE, BLOOD: Lipase: 23 U/L (ref 11–51)

## 2017-09-19 LAB — I-STAT TROPONIN, ED: Troponin i, poc: 0.02 ng/mL (ref 0.00–0.08)

## 2017-09-19 LAB — BRAIN NATRIURETIC PEPTIDE: B Natriuretic Peptide: 1625 pg/mL — ABNORMAL HIGH (ref 0.0–100.0)

## 2017-09-19 IMAGING — DX DG CHEST 2V
2 series · 2 of 2 positions shown · non-contrast
Comparison: Chest x-ray dated [DATE].

CLINICAL DATA: Shortness of breath.

EXAM:
CHEST  2 VIEW

[chest pa]
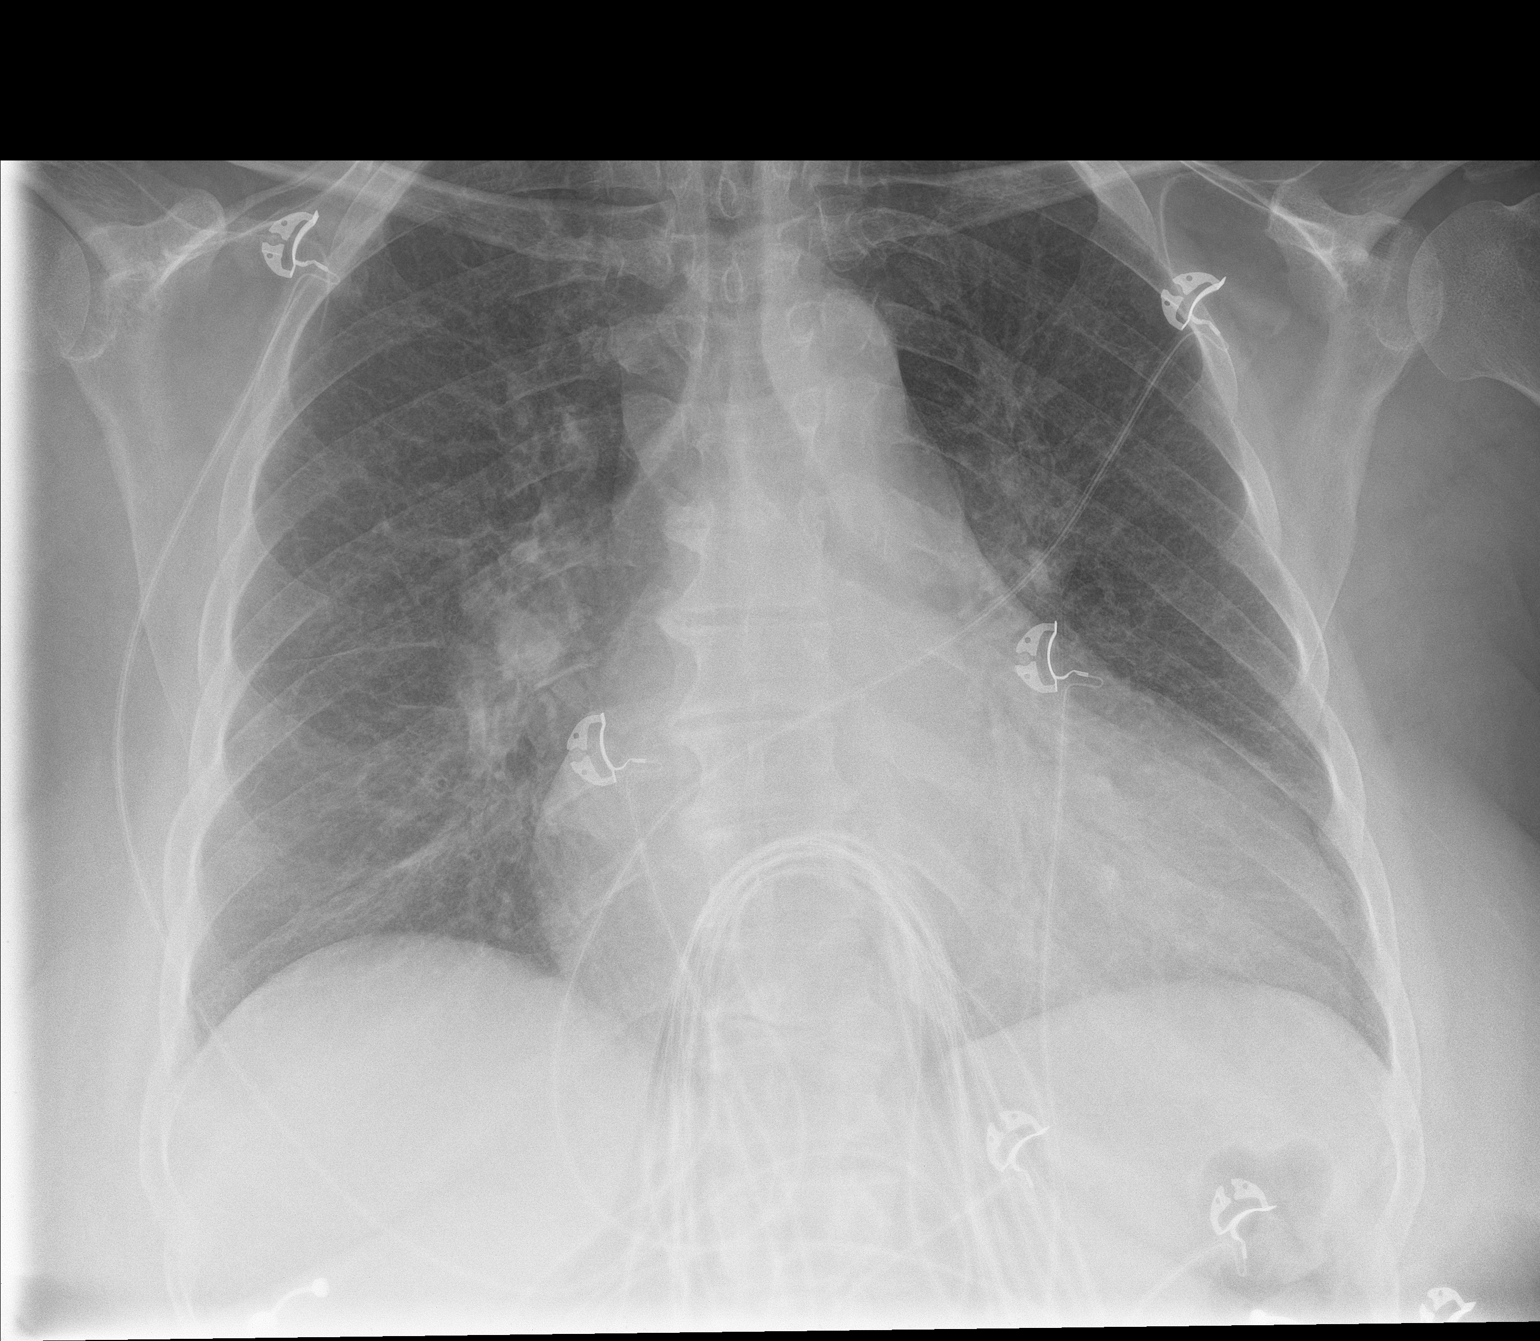

[chest lat]
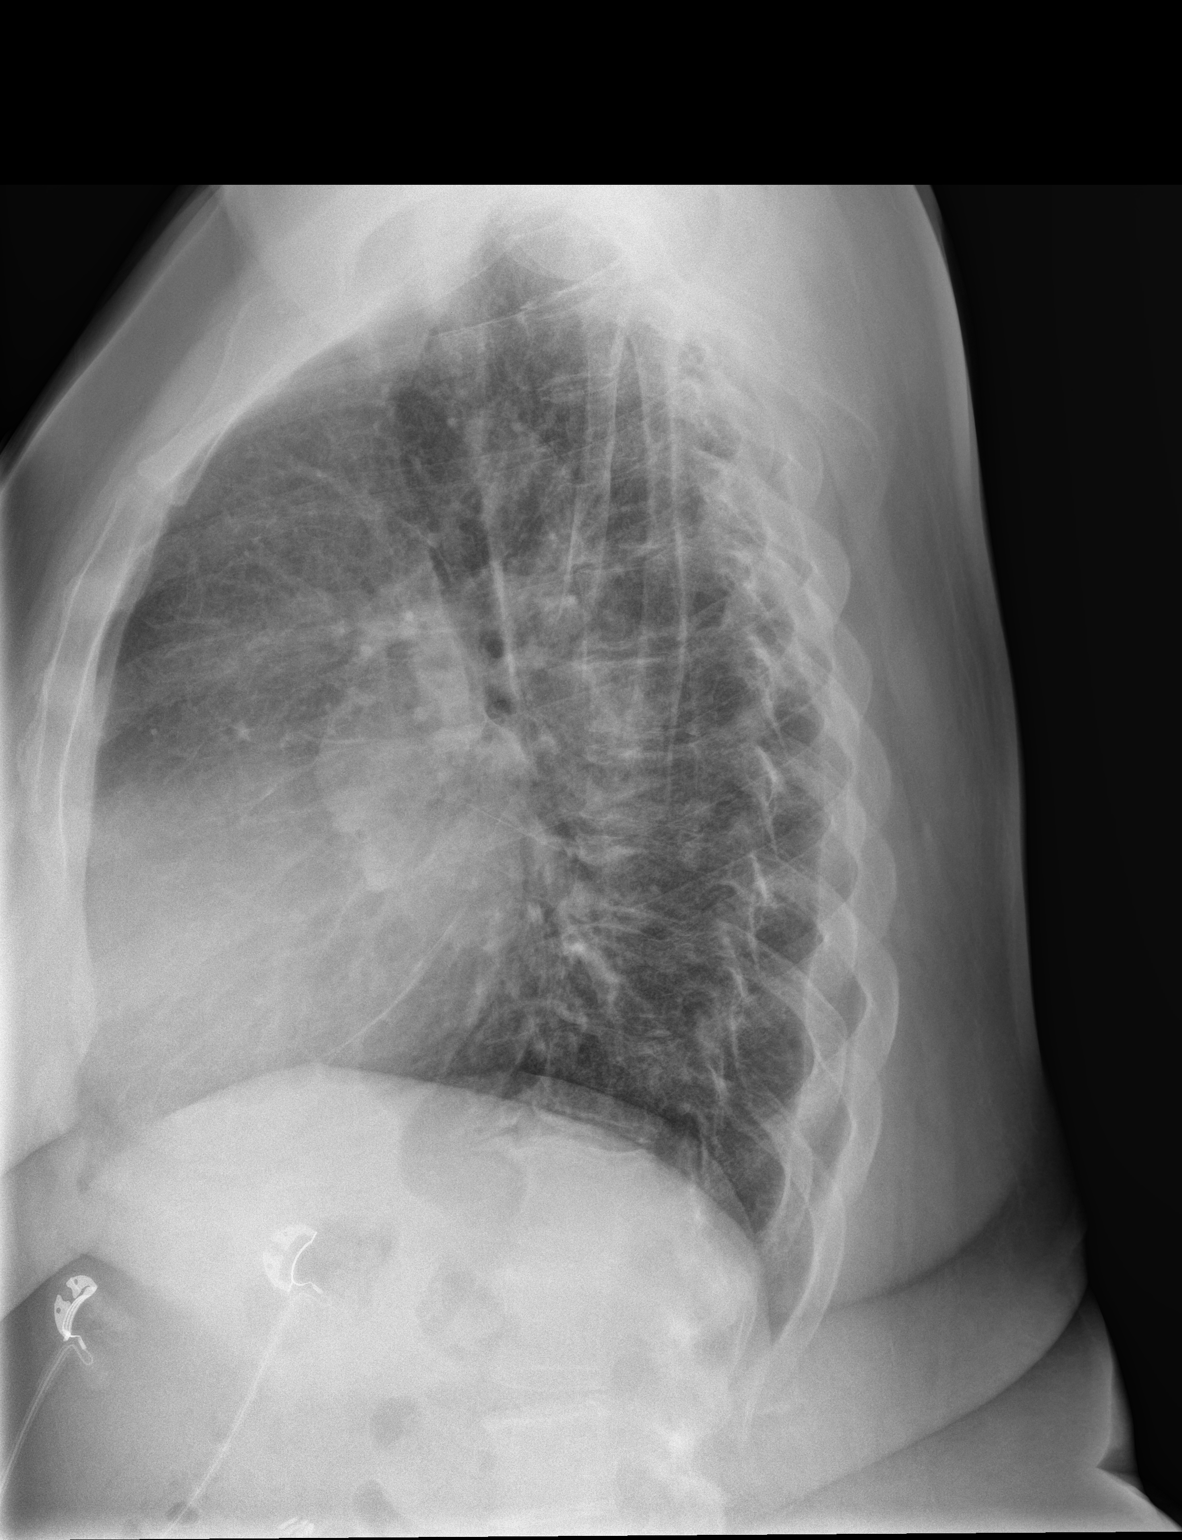

[2 of 2 positions shown; findings below may reference images not displayed]

FINDINGS: Stable cardiomegaly. Prominent central pulmonary arteries again
noted. Normal pulmonary vascularity. Prominent bronchovascular
markings predominantly within both lower lobes are not significantly
changed. No focal consolidation, pleural effusion, or pneumothorax.
No acute osseous abnormality.
IMPRESSION: 1.  No active cardiopulmonary disease.  Stable chronic changes.
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
## 2017-09-19 MED ORDER — LISINOPRIL-HYDROCHLOROTHIAZIDE 20-12.5 MG PO TABS: ORAL_TABLET | ORAL | 0 refills | Status: DC

## 2017-09-19 MED ORDER — POTASSIUM CHLORIDE CRYS ER 20 MEQ PO TBCR
40.0000 meq | EXTENDED_RELEASE_TABLET | Freq: Once | ORAL | Status: AC
Start: 2017-09-19 — End: 2017-09-19
  Administered 2017-09-19: 40 meq via ORAL
  Filled 2017-09-19: qty 2

## 2017-09-19 MED ORDER — TRAMADOL HCL 50 MG PO TABS: 50.0000 mg | ORAL_TABLET | Freq: Four times a day (QID) | ORAL | 0 refills | Status: DC | PRN

## 2017-09-19 MED ORDER — METOPROLOL TARTRATE 50 MG PO TABS
50.0000 mg | ORAL_TABLET | Freq: Once | ORAL | Status: AC
  Administered 2017-09-19: 50 mg via ORAL
  Filled 2017-09-19: qty 1

## 2017-09-19 MED ORDER — INSULIN ASPART PROT & ASPART (70-30 MIX) 100 UNIT/ML ~~LOC~~ SUSP: SUBCUTANEOUS | 11 refills | Status: DC

## 2017-09-19 MED ORDER — ONDANSETRON HCL 4 MG/2ML IJ SOLN
4.0000 mg | Freq: Once | INTRAMUSCULAR | Status: AC
Start: 2017-09-19 — End: 2017-09-19
  Administered 2017-09-19: 4 mg via INTRAVENOUS
  Filled 2017-09-19: qty 2

## 2017-09-19 MED ORDER — FUROSEMIDE 10 MG/ML IJ SOLN
INTRAMUSCULAR | Status: AC
  Filled 2017-09-19: qty 4

## 2017-09-19 MED ORDER — IOPAMIDOL (ISOVUE-370) INJECTION 76%
100.0000 mL | Freq: Once | INTRAVENOUS | Status: AC | PRN
  Administered 2017-09-19: 100 mL via INTRAVENOUS

## 2017-09-19 MED ORDER — NITROGLYCERIN 0.4 MG SL SUBL
0.4000 mg | SUBLINGUAL_TABLET | SUBLINGUAL | Status: DC | PRN
  Administered 2017-09-19 (×3): 0.4 mg via SUBLINGUAL
  Filled 2017-09-19: qty 1

## 2017-09-19 MED ORDER — ASPIRIN 81 MG PO CHEW
324.0000 mg | CHEWABLE_TABLET | Freq: Once | ORAL | Status: AC
  Administered 2017-09-19: 324 mg via ORAL
  Filled 2017-09-19: qty 4

## 2017-09-19 MED ORDER — FUROSEMIDE 10 MG/ML IJ SOLN
40.0000 mg | Freq: Once | INTRAMUSCULAR | Status: AC
  Administered 2017-09-19: 40 mg via INTRAVENOUS
  Filled 2017-09-19: qty 4

## 2017-09-19 MED ORDER — FUROSEMIDE 20 MG PO TABS: ORAL_TABLET | ORAL | 1 refills | Status: DC

## 2017-09-19 MED ORDER — SODIUM CHLORIDE 0.9 % IV SOLN
Freq: Once | INTRAVENOUS | Status: AC
  Administered 2017-09-19: 14:00:00 via INTRAVENOUS

## 2017-09-19 MED ORDER — MORPHINE SULFATE (PF) 2 MG/ML IV SOLN
2.0000 mg | Freq: Once | INTRAVENOUS | Status: AC | PRN
  Administered 2017-09-19: 4 mg via INTRAVENOUS
  Filled 2017-09-19: qty 2

## 2017-09-19 MED ORDER — HYDRALAZINE HCL 20 MG/ML IJ SOLN
5.0000 mg | Freq: Once | INTRAMUSCULAR | Status: AC
  Administered 2017-09-19: 5 mg via INTRAVENOUS
  Filled 2017-09-19: qty 1

## 2017-09-19 NOTE — ED Notes (Signed)
Patient transported to X-ray 

## 2017-09-19 NOTE — Discharge Instructions (Signed)
[facility].  Return sooner if problems
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
## 2017-09-19 NOTE — ED Provider Notes (Signed)
Patient was seen by the hospitalist who felt like the patient was put back on her Lasix blood pressure medicine and diabetes medicine she would improve.  Patient was given Lasix blood pressure medicine here in the emergency department her shortness of breath improved and her blood pressure improved.  Patient will be given prescriptions for medicines and will follow up with her doctor   Milton Ferguson, MD 09/19/17 2110

## 2017-09-19 NOTE — ED Notes (Signed)
Pt requested a Sprite to drank. RN gave sprite. Pt states that her nausea had resolved for the time being

## 2017-09-19 NOTE — ED Notes (Signed)
Pt walked from room 7 to room 2 her O2 saturation did drop from 100% to 97%
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
## 2017-09-19 NOTE — ED Notes (Signed)
Pt understood dc material. NAD noted. Scripts given at dc 

## 2017-09-19 NOTE — ED Notes (Signed)
Called at 1240 no response
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
## 2018-01-26 ENCOUNTER — Emergency Department (HOSPITAL_COMMUNITY)
Admission: EM | Admit: 2018-01-26 | Discharge: 2018-01-26 | Disposition: A | Discharge status: Home / Self Care | Payer: Self-pay | Attending: Emergency Medicine | Admitting: Emergency Medicine

## 2018-01-26 ENCOUNTER — Encounter (HOSPITAL_COMMUNITY): Payer: Self-pay | Admitting: Emergency Medicine

## 2018-01-26 ENCOUNTER — Emergency Department (HOSPITAL_COMMUNITY): Payer: Self-pay

## 2018-01-26 ENCOUNTER — Other Ambulatory Visit: Payer: Self-pay

## 2018-01-26 DIAGNOSIS — E119 Type 2 diabetes mellitus without complications: Secondary | ICD-10-CM | POA: Insufficient documentation

## 2018-01-26 DIAGNOSIS — J449 Chronic obstructive pulmonary disease, unspecified: Secondary | ICD-10-CM | POA: Insufficient documentation

## 2018-01-26 DIAGNOSIS — R739 Hyperglycemia, unspecified: Secondary | ICD-10-CM

## 2018-01-26 DIAGNOSIS — Z87891 Personal history of nicotine dependence: Secondary | ICD-10-CM | POA: Insufficient documentation

## 2018-01-26 DIAGNOSIS — I5031 Acute diastolic (congestive) heart failure: Secondary | ICD-10-CM | POA: Insufficient documentation

## 2018-01-26 DIAGNOSIS — E1165 Type 2 diabetes mellitus with hyperglycemia: Secondary | ICD-10-CM | POA: Insufficient documentation

## 2018-01-26 DIAGNOSIS — Z794 Long term (current) use of insulin: Secondary | ICD-10-CM | POA: Insufficient documentation

## 2018-01-26 DIAGNOSIS — J209 Acute bronchitis, unspecified: Secondary | ICD-10-CM | POA: Insufficient documentation

## 2018-01-26 DIAGNOSIS — Z79899 Other long term (current) drug therapy: Secondary | ICD-10-CM | POA: Insufficient documentation

## 2018-01-26 DIAGNOSIS — Z7982 Long term (current) use of aspirin: Secondary | ICD-10-CM | POA: Insufficient documentation

## 2018-01-26 DIAGNOSIS — I110 Hypertensive heart disease with heart failure: Secondary | ICD-10-CM | POA: Insufficient documentation

## 2018-01-26 LAB — BASIC METABOLIC PANEL
Anion gap: 9 (ref 5–15)
BUN: 13 mg/dL (ref 6–20)
CO2: 26 mmol/L (ref 22–32)
Calcium: 8.9 mg/dL (ref 8.9–10.3)
Chloride: 102 mmol/L (ref 101–111)
Creatinine, Ser: 0.89 mg/dL (ref 0.44–1.00)
GFR calc Af Amer: >60 mL/min (ref >60)
GFR calc non Af Amer: >60 mL/min (ref >60)
Glucose, Bld: 362 mg/dL — ABNORMAL HIGH (ref 65–99)
Potassium: 3.3 mmol/L — ABNORMAL LOW (ref 3.5–5.1)
Sodium: 137 mmol/L (ref 135–145)

## 2018-01-26 LAB — CBC WITH DIFFERENTIAL/PLATELET
Basophils Absolute: 0 10*3/uL (ref 0.0–0.1)
Basophils Relative: 0 %
Eosinophils Absolute: 0.1 10*3/uL (ref 0.0–0.7)
Eosinophils Relative: 1 %
HCT: 43.3 % (ref 36.0–46.0)
Hemoglobin: 14.3 g/dL (ref 12.0–15.0)
Lymphocytes Relative: 50 %
Lymphs Abs: 3.9 10*3/uL (ref 0.7–4.0)
MCH: 29.1 pg (ref 26.0–34.0)
MCHC: 33 g/dL (ref 30.0–36.0)
MCV: 88 fL (ref 78.0–100.0)
Monocytes Absolute: 0.4 10*3/uL (ref 0.1–1.0)
Monocytes Relative: 5 %
Neutro Abs: 3.5 10*3/uL (ref 1.7–7.7)
Neutrophils Relative %: 44 %
Platelets: 184 10*3/uL (ref 150–400)
RBC: 4.92 MIL/uL (ref 3.87–5.11)
RDW: 14.5 % (ref 11.5–15.5)
WBC: 7.9 10*3/uL (ref 4.0–10.5)

## 2018-01-26 LAB — TROPONIN I: Troponin I: <0.03 ng/mL (ref <0.03)

## 2018-01-26 IMAGING — DX DG CHEST 2V
2 series · 2 of 2 positions shown · non-contrast
Comparison: [DATE]

CLINICAL DATA: Cough and chest pain for 2 days

EXAM:
CHEST - 2 VIEW

[chest pa]
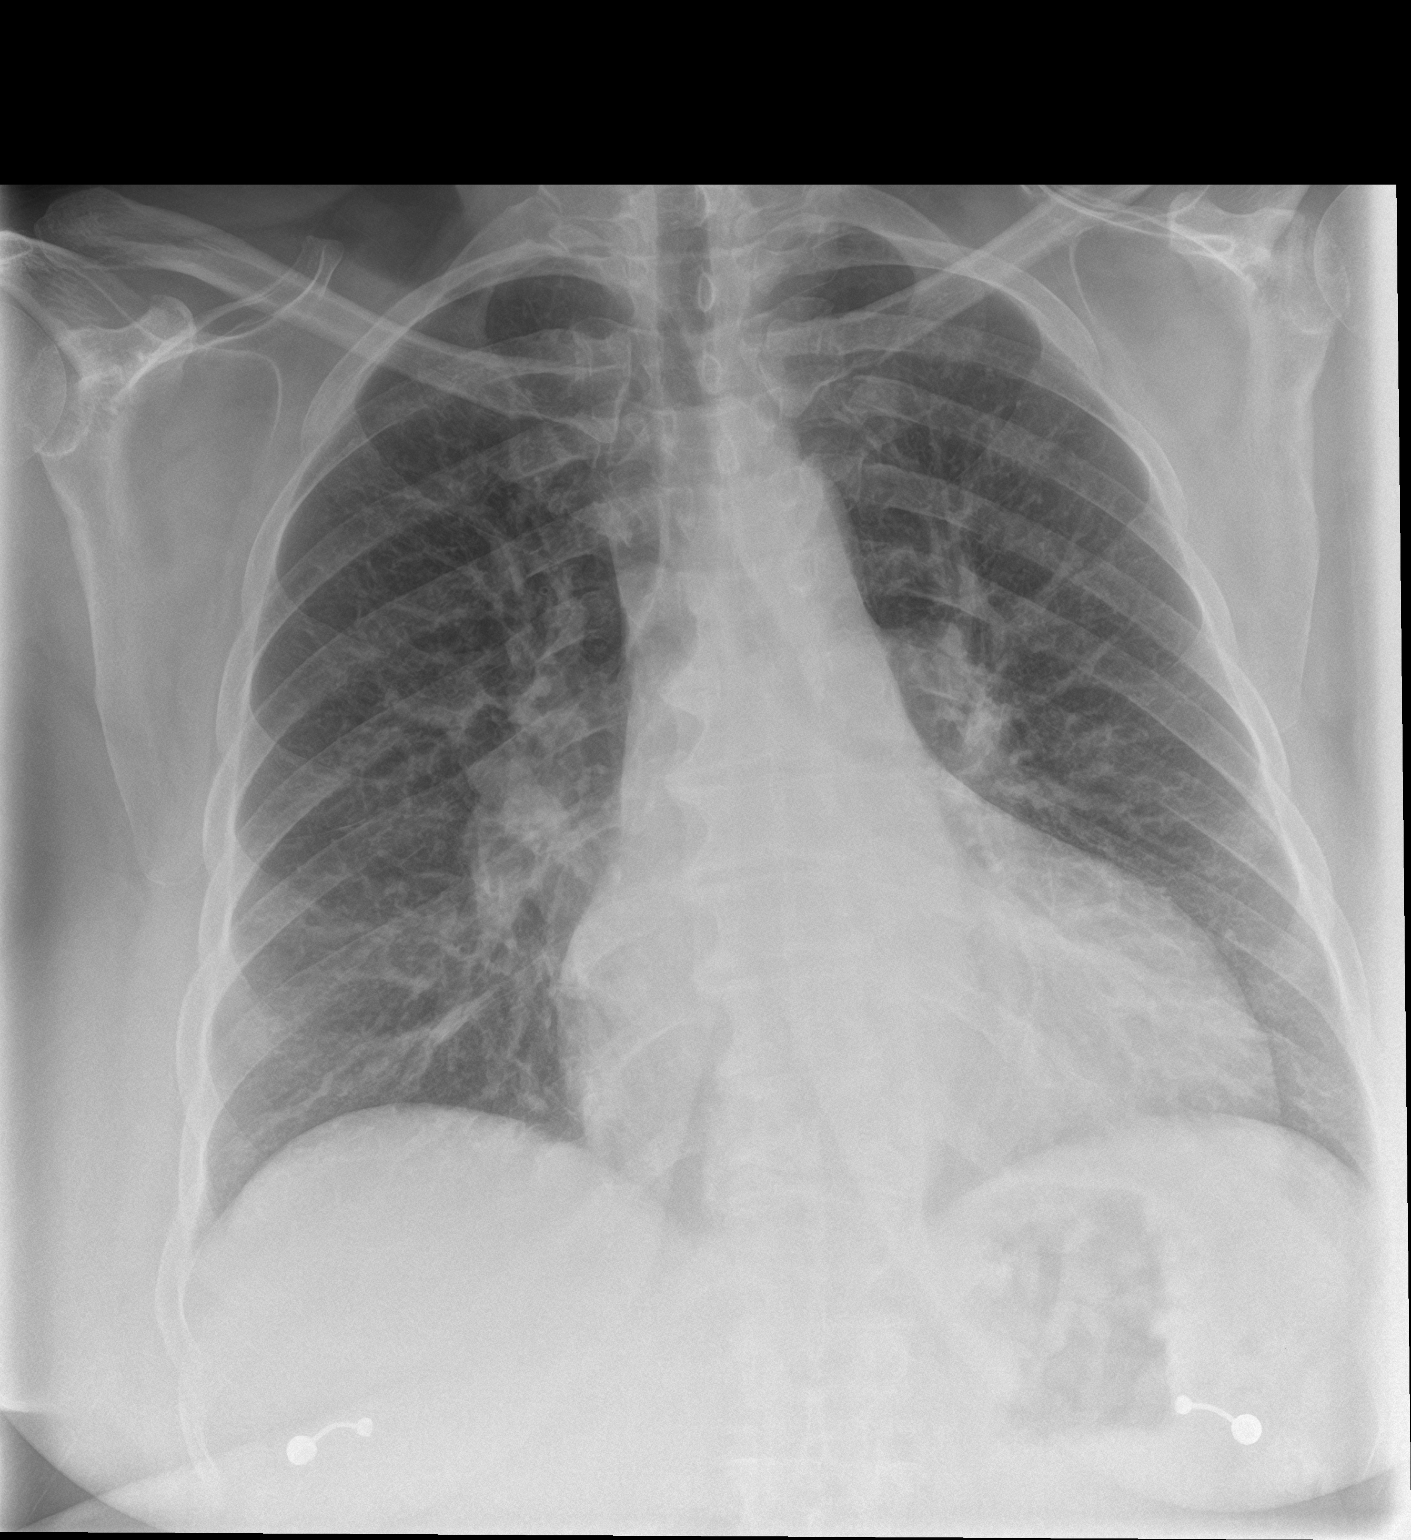

[chest lat]
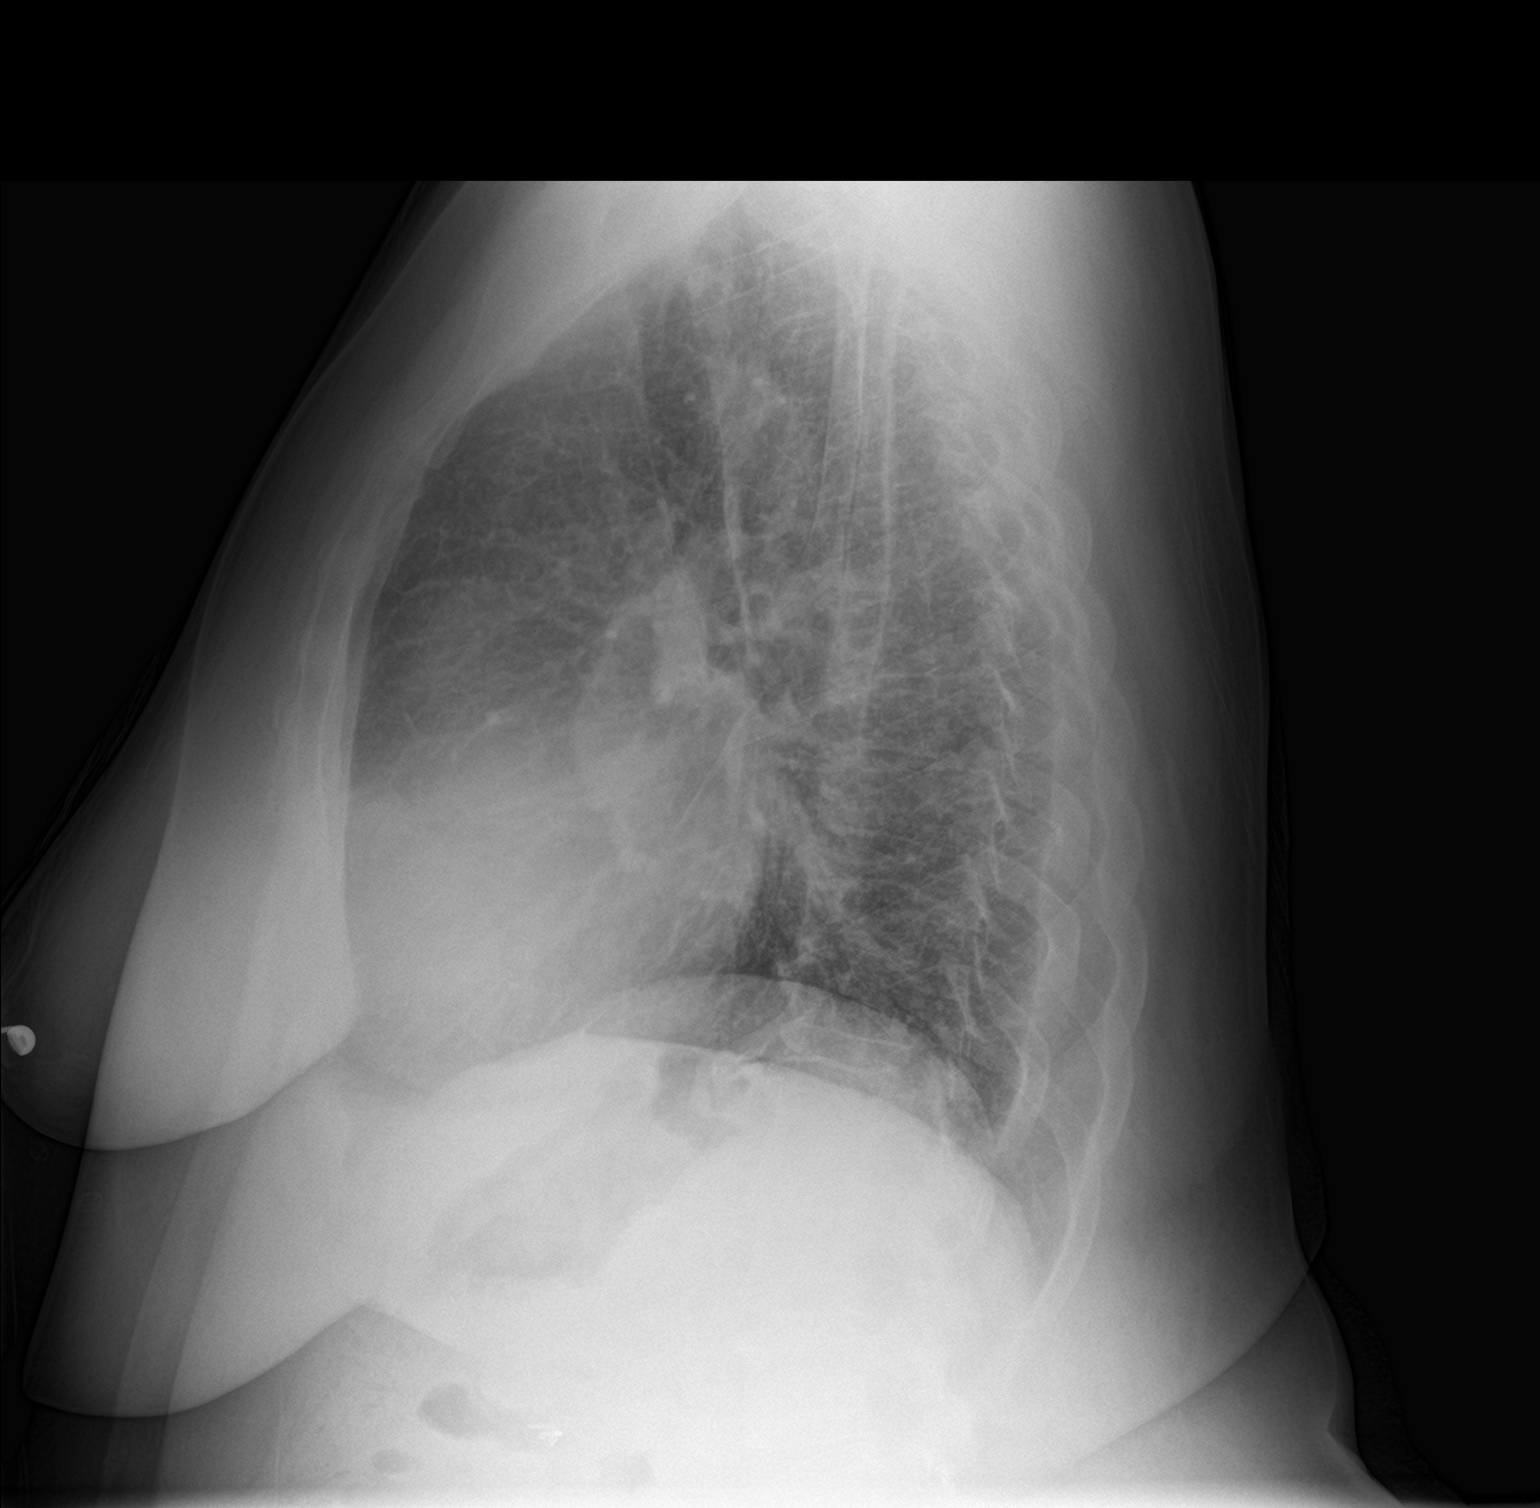

[2 of 2 positions shown; findings below may reference images not displayed]

FINDINGS: Cardiac shadow is enlarged. Lungs are well aerated bilaterally. No
focal infiltrate is seen. Fullness of the hila is noted bilaterally
similar to that seen on prior CT examination. No sizable effusion is
noted. No bony abnormality is noted.
IMPRESSION: Improved aeration in the lungs bilaterally. Stable hilar fullness is
noted. This may be related to underlying sarcoidosis.

## 2018-01-26 MED ORDER — IPRATROPIUM-ALBUTEROL 0.5-2.5 (3) MG/3ML IN SOLN
3.0000 mL | Freq: Once | RESPIRATORY_TRACT | Status: AC
  Administered 2018-01-26: 3 mL via RESPIRATORY_TRACT
  Filled 2018-01-26: qty 3

## 2018-01-26 MED ORDER — METHYLPREDNISOLONE SODIUM SUCC 125 MG IJ SOLR
125.0000 mg | Freq: Once | INTRAMUSCULAR | Status: AC
  Administered 2018-01-26: 125 mg via INTRAVENOUS
  Filled 2018-01-26: qty 2

## 2018-01-26 MED ORDER — FENTANYL CITRATE (PF) 100 MCG/2ML IJ SOLN
50.0000 ug | Freq: Once | INTRAMUSCULAR | Status: AC
Start: 2018-01-26 — End: 2018-01-26
  Administered 2018-01-26: 50 ug via INTRAVENOUS
  Filled 2018-01-26: qty 2

## 2018-01-26 MED ORDER — PREDNISONE 10 MG PO TABS: 10.0000 mg | ORAL_TABLET | Freq: Every day | ORAL | 0 refills | Status: DC

## 2018-01-26 MED ORDER — SODIUM CHLORIDE 0.9 % IV BOLUS
500.0000 mL | Freq: Once | INTRAVENOUS | Status: AC
  Administered 2018-01-26: 500 mL via INTRAVENOUS

## 2018-01-26 MED ORDER — AZITHROMYCIN 250 MG PO TABS: 250.0000 mg | ORAL_TABLET | Freq: Every day | ORAL | 0 refills | Status: DC

## 2018-01-26 MED ORDER — ALBUTEROL SULFATE (2.5 MG/3ML) 0.083% IN NEBU: 2.5000 mg | INHALATION_SOLUTION | Freq: Four times a day (QID) | RESPIRATORY_TRACT | 2 refills | Status: DC | PRN
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
## 2018-01-26 NOTE — Discharge Instructions (Addendum)
Chest x-ray shows no obvious pneumonia.  Your glucose is elevated.  Prescription for prednisone, antibiotic, nebulizer solution.  Follow-up your primary care doctor.

## 2018-01-26 NOTE — ED Triage Notes (Addendum)
PT c/o congested productive clear/think sputum cough x7 days unrelieved by home neb tx and inhalers. PT c/o worsening in SOB with exertion. PT states her PCP prescribed an antibiotic x1 week ago for sinus infection but she was unable to get her medication filled and hasn't started taking it.

## 2018-03-25 ENCOUNTER — Emergency Department (HOSPITAL_COMMUNITY)
Admission: EM | Admit: 2018-03-25 | Discharge: 2018-03-25 | Disposition: A | Discharge status: Home / Self Care | Payer: Self-pay | Attending: Emergency Medicine | Admitting: Emergency Medicine

## 2018-03-25 ENCOUNTER — Other Ambulatory Visit: Payer: Self-pay

## 2018-03-25 ENCOUNTER — Encounter (HOSPITAL_COMMUNITY): Payer: Self-pay | Admitting: Emergency Medicine

## 2018-03-25 ENCOUNTER — Emergency Department (HOSPITAL_COMMUNITY): Payer: Self-pay

## 2018-03-25 DIAGNOSIS — F17210 Nicotine dependence, cigarettes, uncomplicated: Secondary | ICD-10-CM | POA: Insufficient documentation

## 2018-03-25 DIAGNOSIS — R0789 Other chest pain: Secondary | ICD-10-CM | POA: Insufficient documentation

## 2018-03-25 DIAGNOSIS — I5032 Chronic diastolic (congestive) heart failure: Secondary | ICD-10-CM | POA: Insufficient documentation

## 2018-03-25 DIAGNOSIS — Z794 Long term (current) use of insulin: Secondary | ICD-10-CM | POA: Insufficient documentation

## 2018-03-25 DIAGNOSIS — J40 Bronchitis, not specified as acute or chronic: Secondary | ICD-10-CM | POA: Insufficient documentation

## 2018-03-25 DIAGNOSIS — Z79899 Other long term (current) drug therapy: Secondary | ICD-10-CM | POA: Insufficient documentation

## 2018-03-25 DIAGNOSIS — I110 Hypertensive heart disease with heart failure: Secondary | ICD-10-CM | POA: Insufficient documentation

## 2018-03-25 DIAGNOSIS — E119 Type 2 diabetes mellitus without complications: Secondary | ICD-10-CM | POA: Insufficient documentation

## 2018-03-25 LAB — CBC WITH DIFFERENTIAL/PLATELET
Basophils Absolute: 0 10*3/uL (ref 0.0–0.1)
Basophils Relative: 0 %
Eosinophils Absolute: 0.2 10*3/uL (ref 0.0–0.7)
Eosinophils Relative: 2 %
HCT: 41.8 % (ref 36.0–46.0)
Hemoglobin: 14 g/dL (ref 12.0–15.0)
Lymphocytes Relative: 36 %
Lymphs Abs: 3.1 10*3/uL (ref 0.7–4.0)
MCH: 29.5 pg (ref 26.0–34.0)
MCHC: 33.5 g/dL (ref 30.0–36.0)
MCV: 88 fL (ref 78.0–100.0)
Monocytes Absolute: 0.7 10*3/uL (ref 0.1–1.0)
Monocytes Relative: 8 %
Neutro Abs: 4.7 10*3/uL (ref 1.7–7.7)
Neutrophils Relative %: 54 %
Platelets: 237 10*3/uL (ref 150–400)
RBC: 4.75 MIL/uL (ref 3.87–5.11)
RDW: 12.6 % (ref 11.5–15.5)
WBC: 8.6 10*3/uL (ref 4.0–10.5)

## 2018-03-25 LAB — BASIC METABOLIC PANEL
Anion gap: 9 (ref 5–15)
BUN: 13 mg/dL (ref 6–20)
CO2: 28 mmol/L (ref 22–32)
Calcium: 8.9 mg/dL (ref 8.9–10.3)
Chloride: 102 mmol/L (ref 98–111)
Creatinine, Ser: 0.82 mg/dL (ref 0.44–1.00)
GFR calc Af Amer: >60 mL/min (ref >60)
GFR calc non Af Amer: >60 mL/min (ref >60)
Glucose, Bld: 266 mg/dL — ABNORMAL HIGH (ref 70–99)
Potassium: 3.5 mmol/L (ref 3.5–5.1)
Sodium: 139 mmol/L (ref 135–145)

## 2018-03-25 LAB — I-STAT TROPONIN, ED: Troponin i, poc: 0.02 ng/mL (ref 0.00–0.08)

## 2018-03-25 MED ORDER — IPRATROPIUM-ALBUTEROL 0.5-2.5 (3) MG/3ML IN SOLN
3.0000 mL | Freq: Once | RESPIRATORY_TRACT | Status: AC
  Administered 2018-03-25: 3 mL via RESPIRATORY_TRACT
  Filled 2018-03-25: qty 3

## 2018-03-25 MED ORDER — DM-GUAIFENESIN ER 30-600 MG PO TB12: 1.0000 | ORAL_TABLET | Freq: Two times a day (BID) | ORAL | 1 refills | Status: DC

## 2018-03-25 MED ORDER — HYDROCODONE-ACETAMINOPHEN 5-325 MG PO TABS: 1.0000 | ORAL_TABLET | Freq: Four times a day (QID) | ORAL | 0 refills | Status: DC | PRN

## 2018-03-25 MED ORDER — PREDNISONE 10 MG PO TABS: 40.0000 mg | ORAL_TABLET | Freq: Every day | ORAL | 0 refills | Status: DC

## 2018-03-25 MED ORDER — METHYLPREDNISOLONE SODIUM SUCC 125 MG IJ SOLR
125.0000 mg | Freq: Once | INTRAMUSCULAR | Status: AC
  Administered 2018-03-25: 125 mg via INTRAVENOUS
  Filled 2018-03-25: qty 2
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
## 2018-03-25 NOTE — ED Triage Notes (Signed)
Chest pain with productive cough since yesterday. Pt reports also having left arm sharp pain and pain with deep breath. Pt sputum brown/green per patient.

## 2018-03-25 NOTE — Discharge Instructions (Addendum)
Use your albuterol inhaler or nebulizer every 6 hours for the next week.  Take the prednisone as directed for the next 5 days.  Take the Mucinex to help with the cough and phlegm.  Return for any new or worse symptoms.  In addition take the hydrocodone for the chest wall pain.

## 2018-04-20 ENCOUNTER — Emergency Department (HOSPITAL_COMMUNITY)
Admission: EM | Admit: 2018-04-20 | Discharge: 2018-04-20 | Disposition: A | Discharge status: Home / Self Care | Payer: Self-pay | Attending: Emergency Medicine | Admitting: Emergency Medicine

## 2018-04-20 ENCOUNTER — Encounter (HOSPITAL_COMMUNITY): Payer: Self-pay | Admitting: *Deleted

## 2018-04-20 ENCOUNTER — Emergency Department (HOSPITAL_COMMUNITY): Payer: Self-pay

## 2018-04-20 ENCOUNTER — Other Ambulatory Visit: Payer: Self-pay

## 2018-04-20 DIAGNOSIS — Z79899 Other long term (current) drug therapy: Secondary | ICD-10-CM | POA: Insufficient documentation

## 2018-04-20 DIAGNOSIS — I110 Hypertensive heart disease with heart failure: Secondary | ICD-10-CM | POA: Insufficient documentation

## 2018-04-20 DIAGNOSIS — Z7982 Long term (current) use of aspirin: Secondary | ICD-10-CM | POA: Insufficient documentation

## 2018-04-20 DIAGNOSIS — J449 Chronic obstructive pulmonary disease, unspecified: Secondary | ICD-10-CM | POA: Insufficient documentation

## 2018-04-20 DIAGNOSIS — F17210 Nicotine dependence, cigarettes, uncomplicated: Secondary | ICD-10-CM | POA: Insufficient documentation

## 2018-04-20 DIAGNOSIS — I5032 Chronic diastolic (congestive) heart failure: Secondary | ICD-10-CM | POA: Insufficient documentation

## 2018-04-20 DIAGNOSIS — E119 Type 2 diabetes mellitus without complications: Secondary | ICD-10-CM | POA: Insufficient documentation

## 2018-04-20 DIAGNOSIS — Z794 Long term (current) use of insulin: Secondary | ICD-10-CM | POA: Insufficient documentation

## 2018-04-20 DIAGNOSIS — M25561 Pain in right knee: Secondary | ICD-10-CM | POA: Insufficient documentation

## 2018-04-20 IMAGING — DX DG KNEE COMPLETE 4+V*R*
4 series · 4 of 4 positions shown · non-contrast
Comparison: [DATE]

CLINICAL DATA: Pain and swelling in the right knee since the
patient felt a pop yesterday.

EXAM:
RIGHT KNEE - COMPLETE 4+ VIEW

[knee ap]
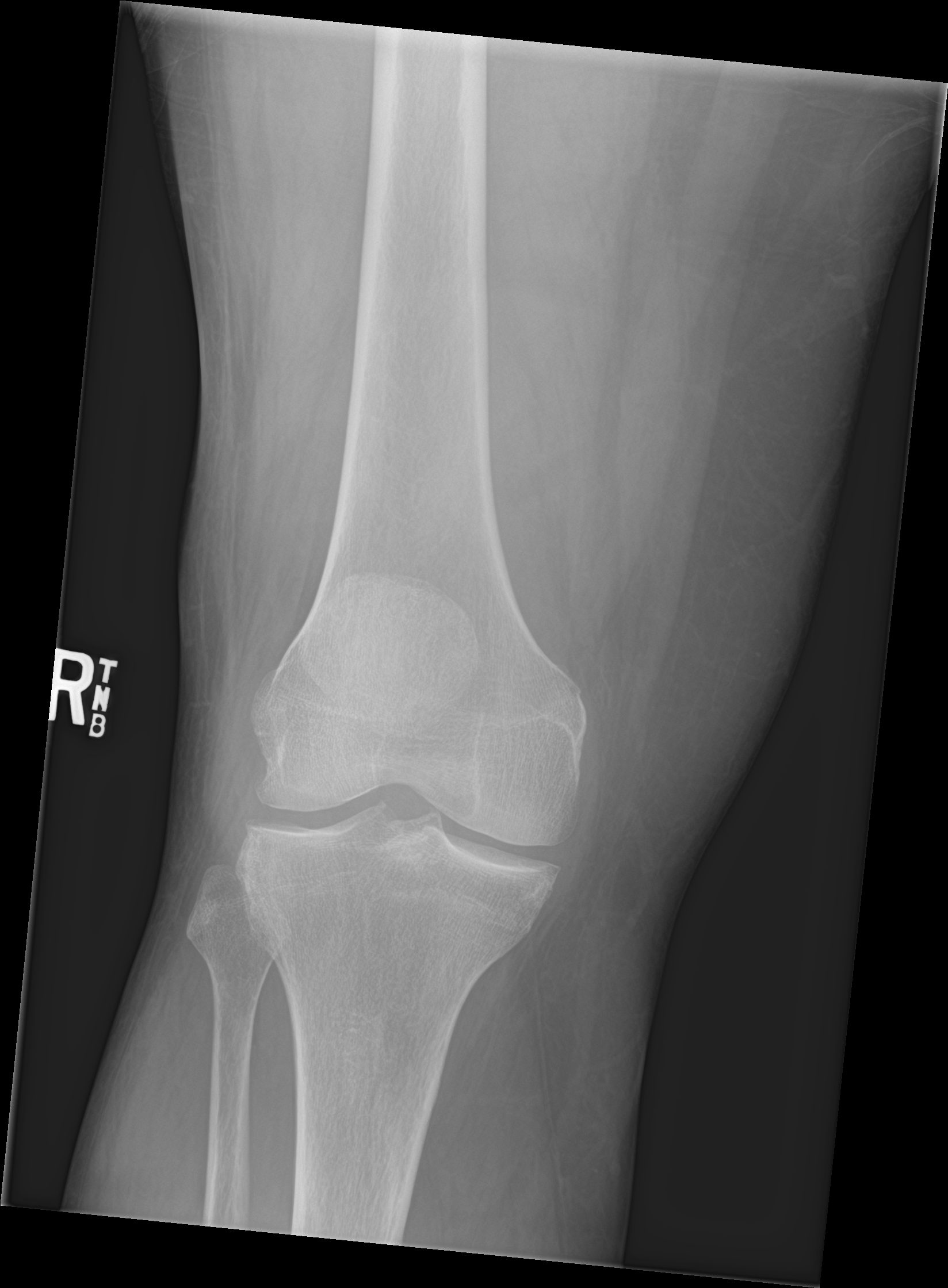

[tunnel]
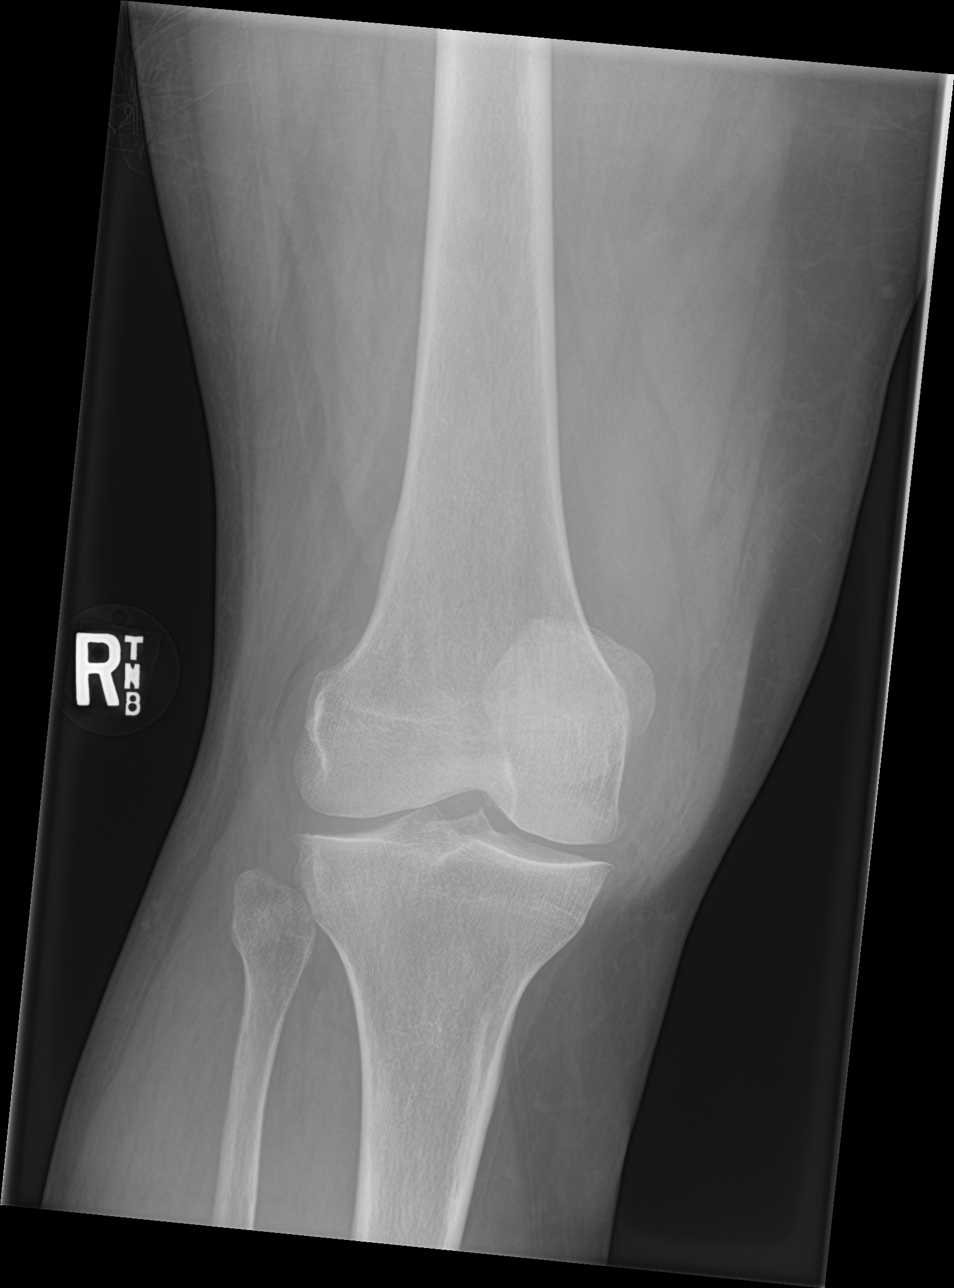

[knee lat]
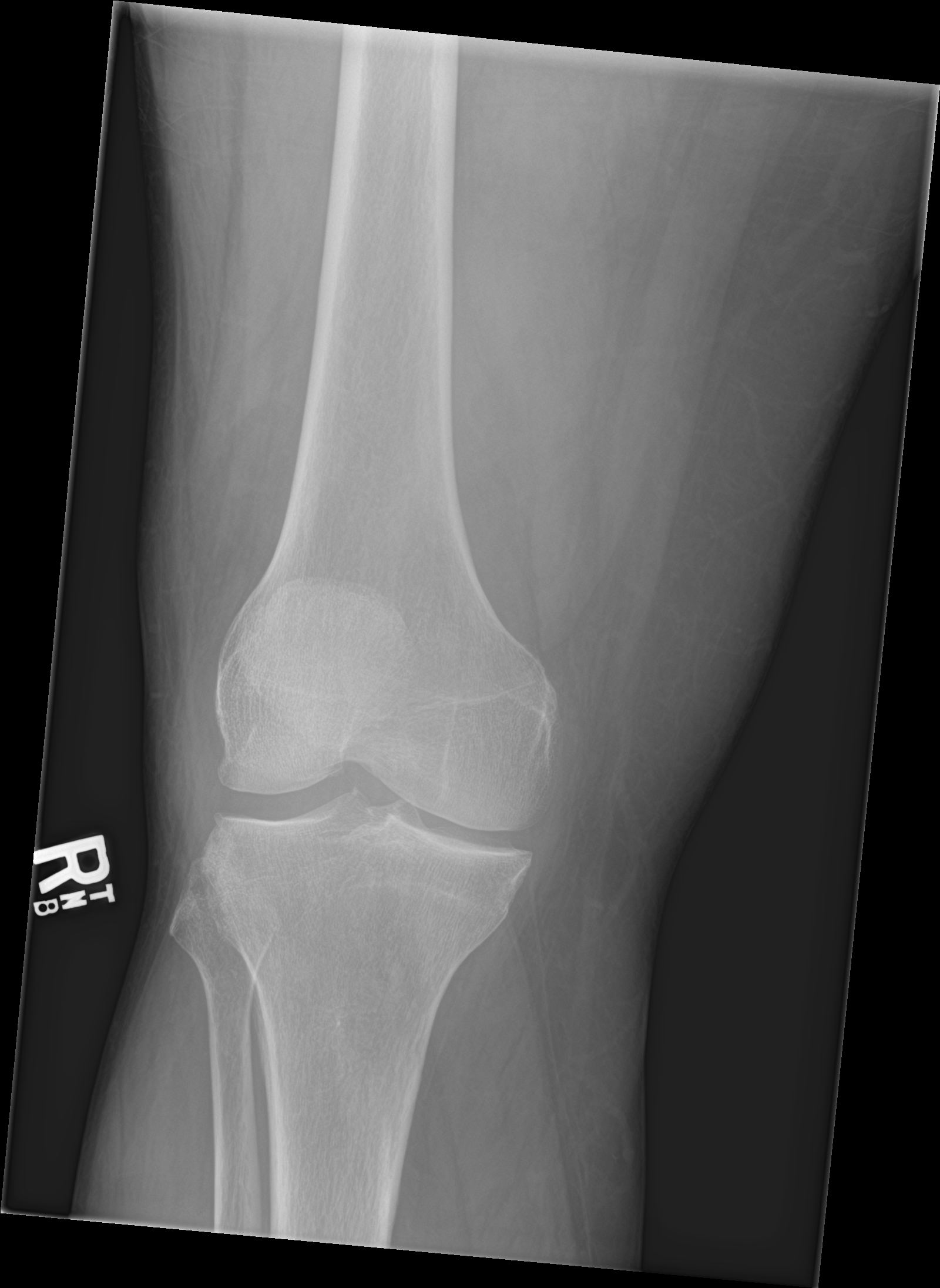

[knee sunrise]
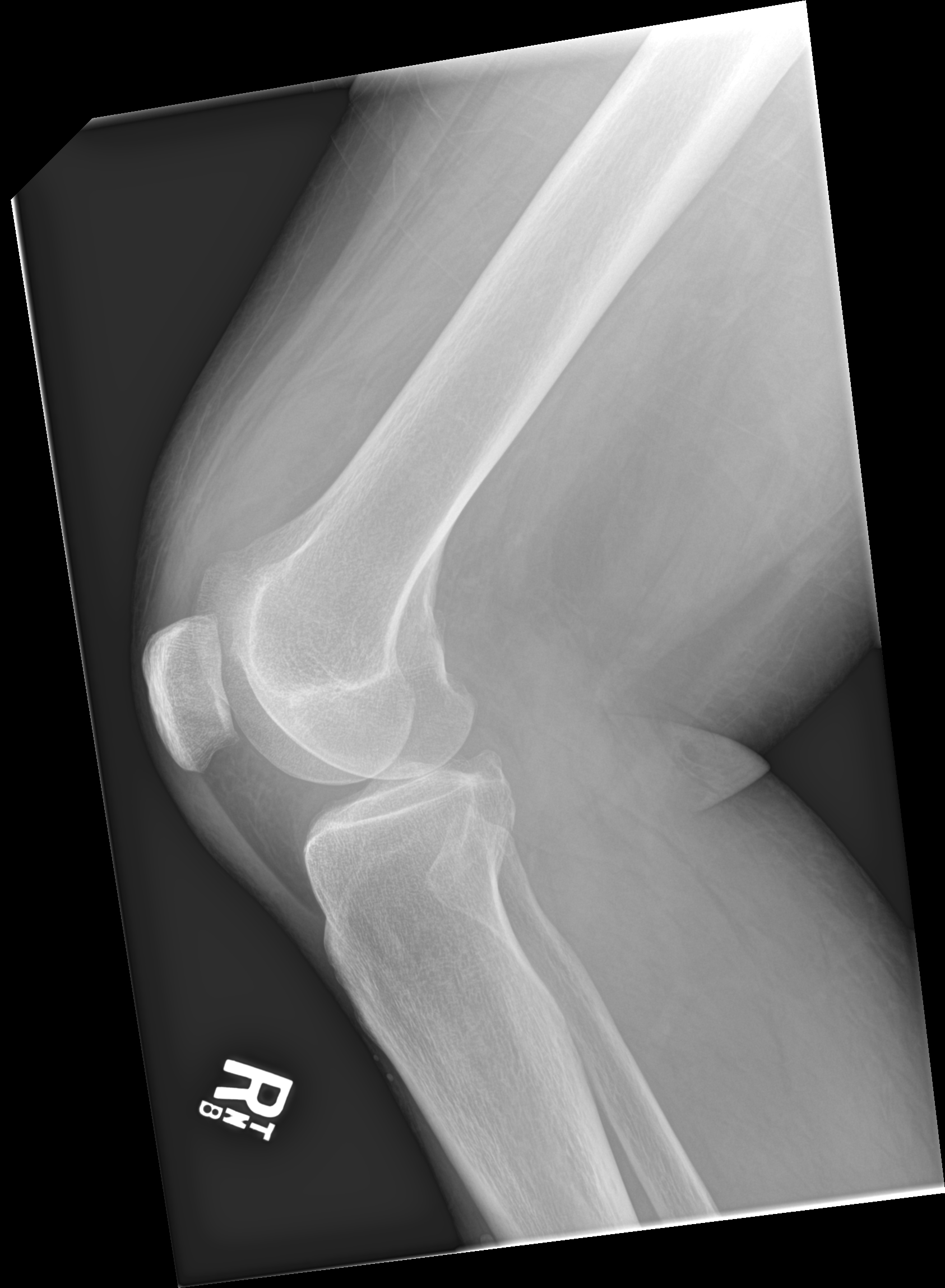

[4 of 4 positions shown; findings below may reference images not displayed]

FINDINGS: There is no fracture or dislocation. Joint effusion in the
suprapatellar recess. Minimal tricompartmental marginal osteophytes.
IMPRESSION: Joint effusion.  No significant bone abnormality.

## 2018-04-20 MED ORDER — HYDROMORPHONE HCL 1 MG/ML IJ SOLN
1.0000 mg | Freq: Once | INTRAMUSCULAR | Status: AC
  Administered 2018-04-20: 1 mg via INTRAMUSCULAR
  Filled 2018-04-20: qty 1

## 2018-04-20 MED ORDER — CELECOXIB 100 MG PO CAPS: 100.0000 mg | ORAL_CAPSULE | Freq: Two times a day (BID) | ORAL | 0 refills | Status: DC

## 2018-04-20 MED ORDER — HYDROCODONE-ACETAMINOPHEN 5-325 MG PO TABS: 1.0000 | ORAL_TABLET | Freq: Four times a day (QID) | ORAL | 0 refills | Status: DC | PRN

## 2018-04-20 NOTE — ED Triage Notes (Signed)
Pt c/o right knee pain; pt states she heard a "pop" and it has been swollen since
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
## 2018-04-20 NOTE — Discharge Instructions (Signed)
Follow-up with your family doctor later this week or he can follow-up with the orthopedic doctors.  You can see either Dr. Ann Maki or Dr. Aline Brochure in [place]

## 2018-04-20 NOTE — ED Notes (Signed)
Pt left with daughter driving  

## 2018-07-07 ENCOUNTER — Encounter (HOSPITAL_COMMUNITY): Payer: Self-pay | Admitting: *Deleted

## 2018-07-07 ENCOUNTER — Other Ambulatory Visit: Payer: Self-pay

## 2018-07-07 ENCOUNTER — Emergency Department (HOSPITAL_COMMUNITY): Payer: Self-pay

## 2018-07-07 ENCOUNTER — Emergency Department (HOSPITAL_COMMUNITY)
Admission: EM | Admit: 2018-07-07 | Discharge: 2018-07-07 | Disposition: A | Discharge status: Home / Self Care | Payer: Self-pay | Attending: Emergency Medicine | Admitting: Emergency Medicine

## 2018-07-07 DIAGNOSIS — F17210 Nicotine dependence, cigarettes, uncomplicated: Secondary | ICD-10-CM | POA: Insufficient documentation

## 2018-07-07 DIAGNOSIS — E119 Type 2 diabetes mellitus without complications: Secondary | ICD-10-CM | POA: Insufficient documentation

## 2018-07-07 DIAGNOSIS — Z7982 Long term (current) use of aspirin: Secondary | ICD-10-CM | POA: Insufficient documentation

## 2018-07-07 DIAGNOSIS — J449 Chronic obstructive pulmonary disease, unspecified: Secondary | ICD-10-CM | POA: Insufficient documentation

## 2018-07-07 DIAGNOSIS — Z7984 Long term (current) use of oral hypoglycemic drugs: Secondary | ICD-10-CM | POA: Insufficient documentation

## 2018-07-07 DIAGNOSIS — R1013 Epigastric pain: Secondary | ICD-10-CM | POA: Insufficient documentation

## 2018-07-07 DIAGNOSIS — I10 Essential (primary) hypertension: Secondary | ICD-10-CM | POA: Insufficient documentation

## 2018-07-07 DIAGNOSIS — Z79899 Other long term (current) drug therapy: Secondary | ICD-10-CM | POA: Insufficient documentation

## 2018-07-07 LAB — COMPREHENSIVE METABOLIC PANEL
ALT: 18 U/L (ref 0–44)
AST: 25 U/L (ref 15–41)
Albumin: 3.7 g/dL (ref 3.5–5.0)
Alkaline Phosphatase: 102 U/L (ref 38–126)
Anion gap: 7 (ref 5–15)
BUN: 14 mg/dL (ref 6–20)
CO2: 28 mmol/L (ref 22–32)
Calcium: 8.9 mg/dL (ref 8.9–10.3)
Chloride: 105 mmol/L (ref 98–111)
Creatinine, Ser: 0.77 mg/dL (ref 0.44–1.00)
GFR calc Af Amer: >60 mL/min (ref >60)
GFR calc non Af Amer: >60 mL/min (ref >60)
Glucose, Bld: 184 mg/dL — ABNORMAL HIGH (ref 70–99)
Potassium: 3.5 mmol/L (ref 3.5–5.1)
Sodium: 140 mmol/L (ref 135–145)
Total Bilirubin: 0.7 mg/dL (ref 0.3–1.2)
Total Protein: 7.1 g/dL (ref 6.5–8.1)

## 2018-07-07 LAB — CBC WITH DIFFERENTIAL/PLATELET
Abs Immature Granulocytes: 0.01 10*3/uL (ref 0.00–0.07)
Basophils Absolute: 0.1 10*3/uL (ref 0.0–0.1)
Basophils Relative: 1 %
Eosinophils Absolute: 0.2 10*3/uL (ref 0.0–0.5)
Eosinophils Relative: 3 %
HCT: 43.8 % (ref 36.0–46.0)
Hemoglobin: 13.7 g/dL (ref 12.0–15.0)
Immature Granulocytes: 0 %
Lymphocytes Relative: 49 %
Lymphs Abs: 3.2 10*3/uL (ref 0.7–4.0)
MCH: 27.5 pg (ref 26.0–34.0)
MCHC: 31.3 g/dL (ref 30.0–36.0)
MCV: 88 fL (ref 80.0–100.0)
Monocytes Absolute: 0.4 10*3/uL (ref 0.1–1.0)
Monocytes Relative: 7 %
Neutro Abs: 2.6 10*3/uL (ref 1.7–7.7)
Neutrophils Relative %: 40 %
Platelets: 230 10*3/uL (ref 150–400)
RBC: 4.98 MIL/uL (ref 3.87–5.11)
RDW: 12.9 % (ref 11.5–15.5)
WBC: 6.4 10*3/uL (ref 4.0–10.5)
nRBC: 0 % (ref 0.0–0.2)

## 2018-07-07 LAB — LIPASE, BLOOD: Lipase: 30 U/L (ref 11–51)

## 2018-07-07 LAB — CBC AND DIFFERENTIAL: Neutrophils Absolute: 3

## 2018-07-07 LAB — TROPONIN I: Troponin I: <0.03 ng/mL (ref <0.03)

## 2018-07-07 IMAGING — DX DG ABDOMEN ACUTE W/ 1V CHEST
4 series · 4 of 4 positions shown · non-contrast
Comparison: Chest radiograph dated [DATE]

CLINICAL DATA: 52-year-old female with shortness of breath.

EXAM:
DG ABDOMEN ACUTE W/ 1V CHEST

[chest pa]
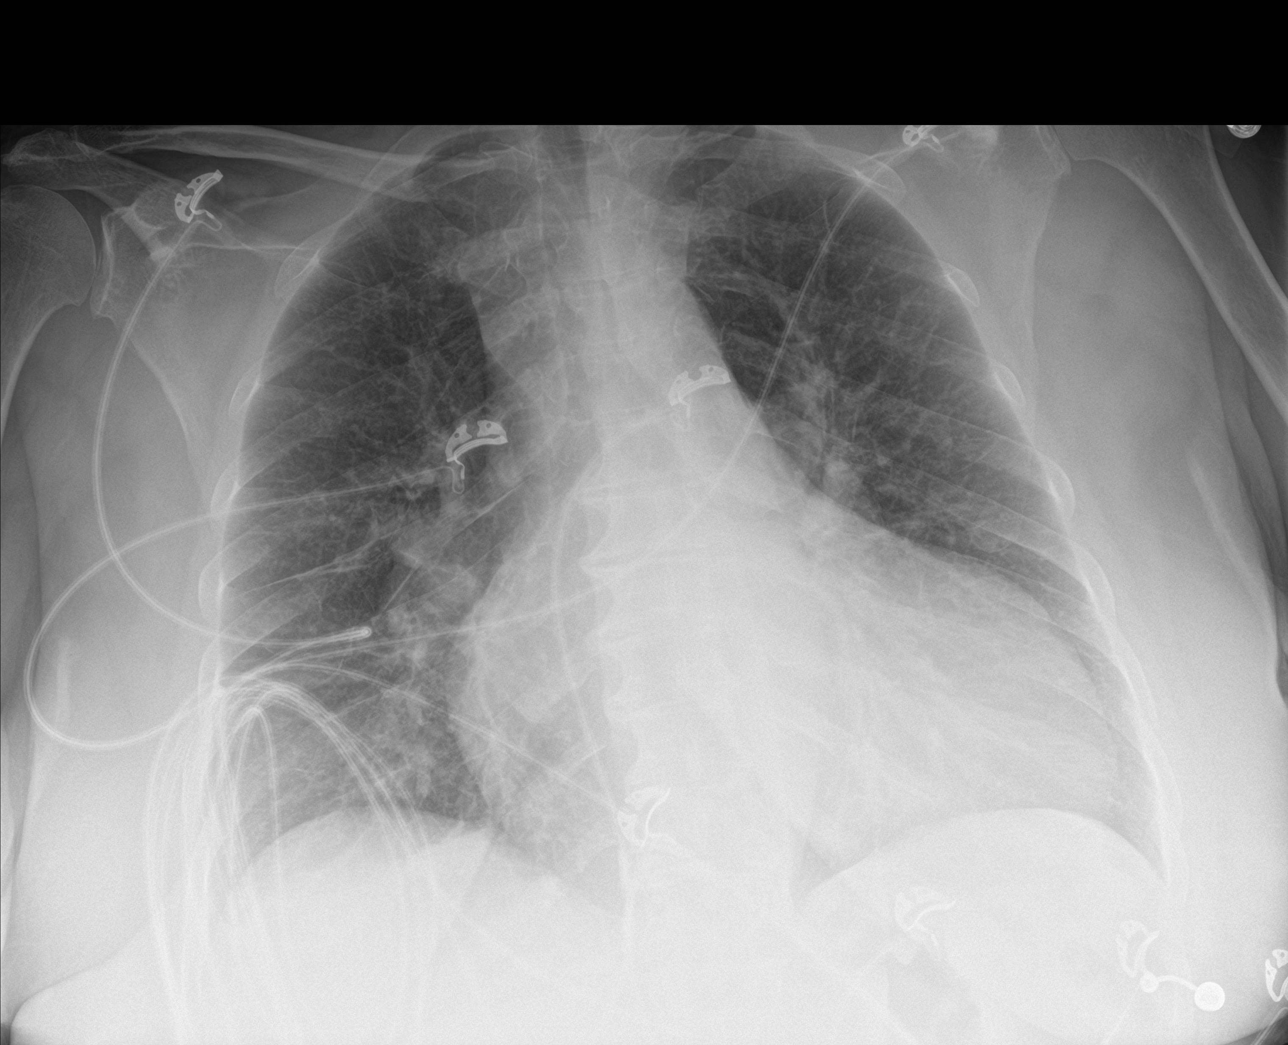

[abdomen erect]
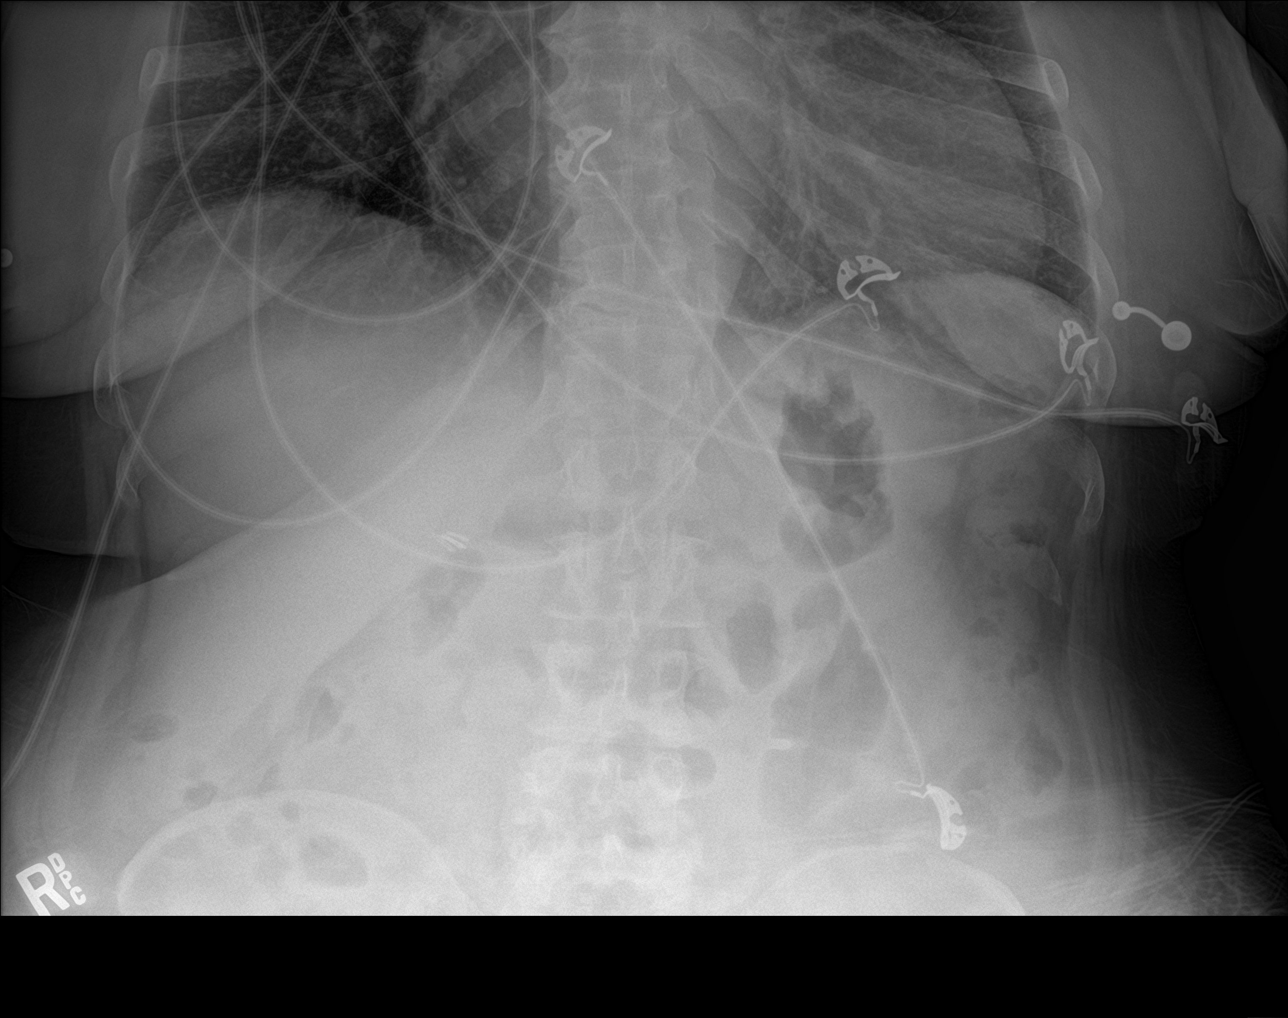

[abdomen supine (1 of 2)]
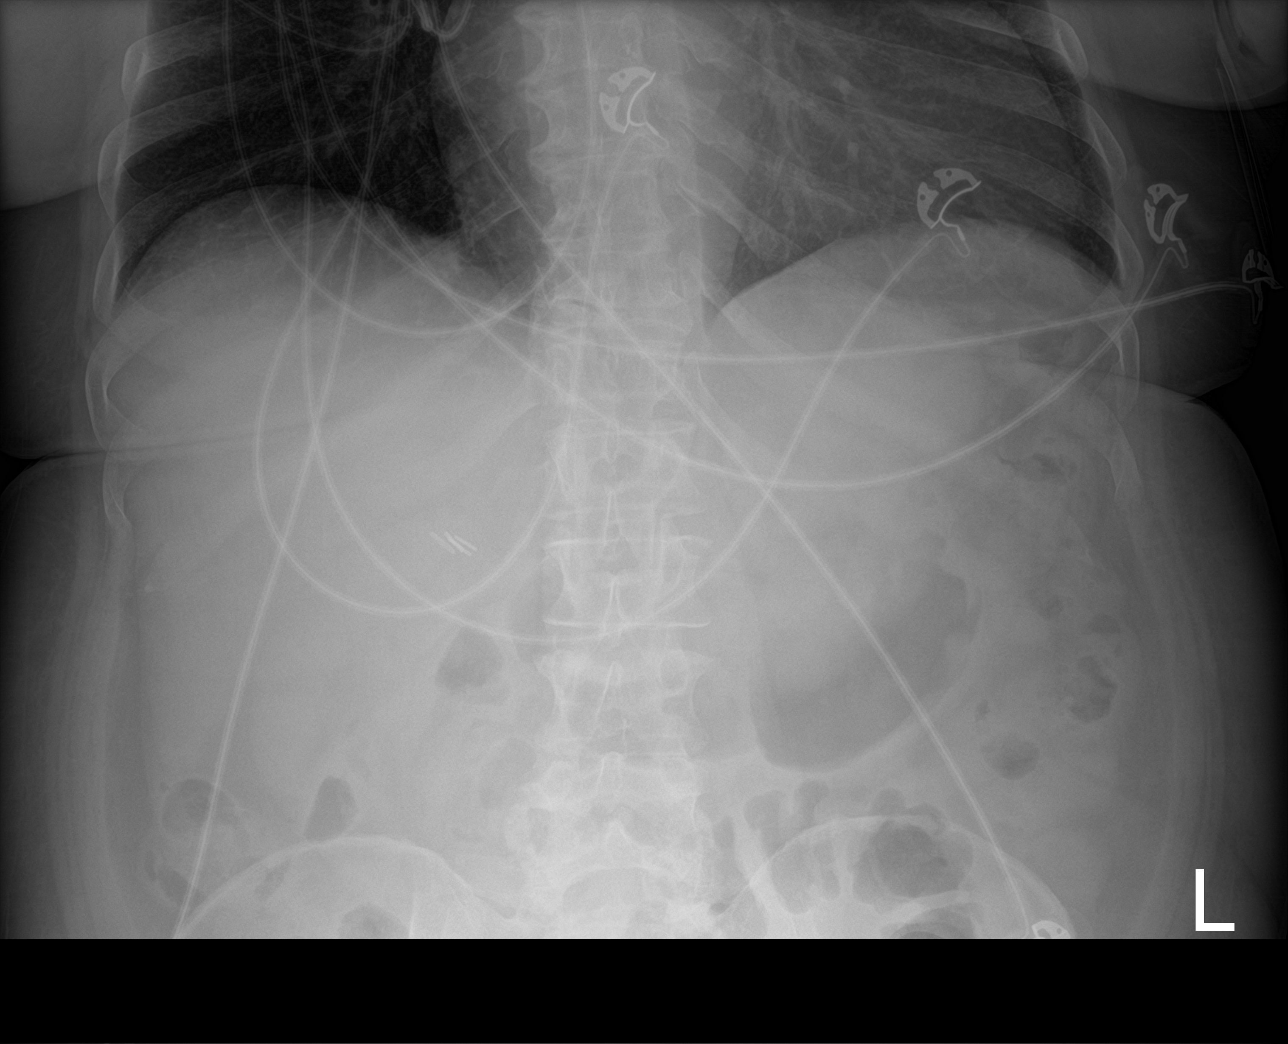

[abdomen supine (2 of 2)]
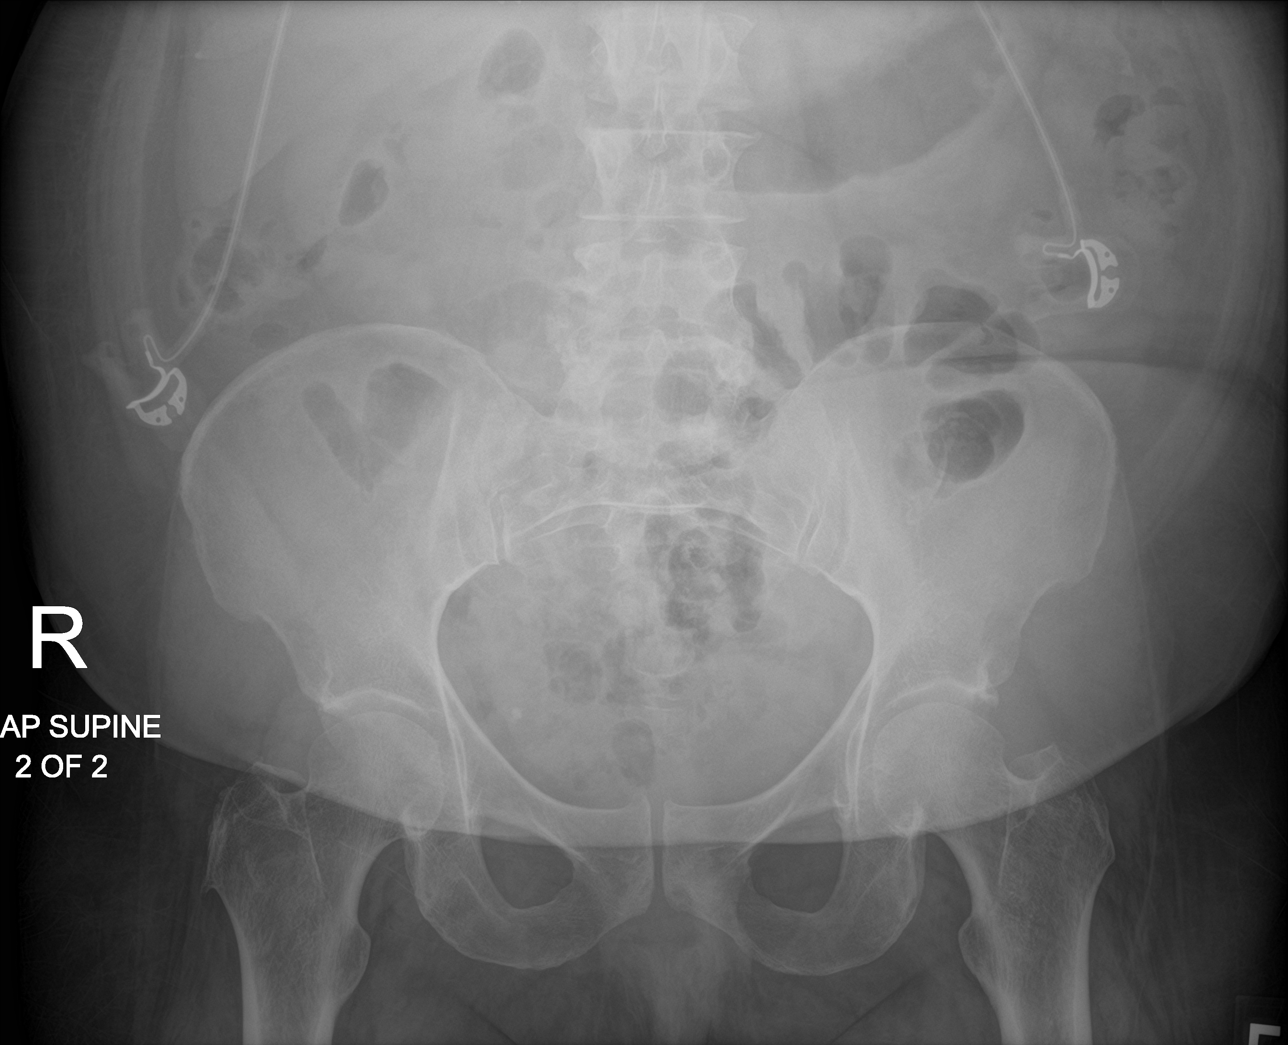

[4 of 4 positions shown; findings below may reference images not displayed]

FINDINGS: The lungs are clear. There is no pleural effusion or pneumothorax.
There is cardiomegaly similar to prior radiograph. Mild central
vascular prominence may represent mild congestion.

There is no bowel dilatation or evidence of obstruction. No free air
or radiopaque calculi. Right upper quadrant cholecystectomy clips.
The osseous structures and soft tissues are grossly unremarkable.
IMPRESSION: 1. No acute cardiopulmonary process.
2. No bowel obstruction.

## 2018-07-07 MED ORDER — ALUM & MAG HYDROXIDE-SIMETH 200-200-20 MG/5ML PO SUSP
30.0000 mL | Freq: Once | ORAL | Status: AC
  Administered 2018-07-07: 30 mL via ORAL
  Filled 2018-07-07: qty 30

## 2018-07-07 MED ORDER — OMEPRAZOLE 20 MG PO CPDR: 20.0000 mg | DELAYED_RELEASE_CAPSULE | Freq: Every day | ORAL | 0 refills | Status: DC

## 2018-07-07 MED ORDER — ONDANSETRON HCL 4 MG/2ML IJ SOLN
4.0000 mg | Freq: Once | INTRAMUSCULAR | Status: AC
  Administered 2018-07-07: 4 mg via INTRAVENOUS
  Filled 2018-07-07: qty 2

## 2018-07-07 MED ORDER — FAMOTIDINE IN NACL 20-0.9 MG/50ML-% IV SOLN
20.0000 mg | Freq: Once | INTRAVENOUS | Status: AC
  Administered 2018-07-07: 20 mg via INTRAVENOUS
  Filled 2018-07-07: qty 50

## 2018-07-07 NOTE — ED Triage Notes (Signed)
Pt c/o epigastric pain and sob that started today; pt denies any n/v/d
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
## 2018-07-26 ENCOUNTER — Encounter (HOSPITAL_COMMUNITY): Payer: Self-pay

## 2018-07-26 ENCOUNTER — Emergency Department (HOSPITAL_COMMUNITY)
Admission: EM | Admit: 2018-07-26 | Discharge: 2018-07-26 | Disposition: A | Discharge status: Home / Self Care | Payer: Self-pay | Attending: Emergency Medicine | Admitting: Emergency Medicine

## 2018-07-26 ENCOUNTER — Other Ambulatory Visit: Payer: Self-pay

## 2018-07-26 ENCOUNTER — Emergency Department (HOSPITAL_COMMUNITY): Payer: Self-pay

## 2018-07-26 DIAGNOSIS — I10 Essential (primary) hypertension: Secondary | ICD-10-CM | POA: Insufficient documentation

## 2018-07-26 DIAGNOSIS — E119 Type 2 diabetes mellitus without complications: Secondary | ICD-10-CM | POA: Insufficient documentation

## 2018-07-26 DIAGNOSIS — Z7982 Long term (current) use of aspirin: Secondary | ICD-10-CM | POA: Insufficient documentation

## 2018-07-26 DIAGNOSIS — W010XXA Fall on same level from slipping, tripping and stumbling without subsequent striking against object, initial encounter: Secondary | ICD-10-CM | POA: Insufficient documentation

## 2018-07-26 DIAGNOSIS — F17210 Nicotine dependence, cigarettes, uncomplicated: Secondary | ICD-10-CM | POA: Insufficient documentation

## 2018-07-26 DIAGNOSIS — Y990 Civilian activity done for income or pay: Secondary | ICD-10-CM | POA: Insufficient documentation

## 2018-07-26 DIAGNOSIS — S40011A Contusion of right shoulder, initial encounter: Secondary | ICD-10-CM | POA: Insufficient documentation

## 2018-07-26 DIAGNOSIS — J449 Chronic obstructive pulmonary disease, unspecified: Secondary | ICD-10-CM | POA: Insufficient documentation

## 2018-07-26 DIAGNOSIS — Z79899 Other long term (current) drug therapy: Secondary | ICD-10-CM | POA: Insufficient documentation

## 2018-07-26 DIAGNOSIS — M25521 Pain in right elbow: Secondary | ICD-10-CM | POA: Insufficient documentation

## 2018-07-26 DIAGNOSIS — Z794 Long term (current) use of insulin: Secondary | ICD-10-CM | POA: Insufficient documentation

## 2018-07-26 DIAGNOSIS — Y929 Unspecified place or not applicable: Secondary | ICD-10-CM | POA: Insufficient documentation

## 2018-07-26 DIAGNOSIS — Y939 Activity, unspecified: Secondary | ICD-10-CM | POA: Insufficient documentation

## 2018-07-26 IMAGING — DX DG SHOULDER 2+V*R*
3 series · 3 of 3 positions shown · non-contrast
Comparison: None.

CLINICAL DATA: Pt fell yesterday and has generalized right shoulder
pain. pain

EXAM:
RIGHT SHOULDER - 2+ VIEW

[shoulder grashey]
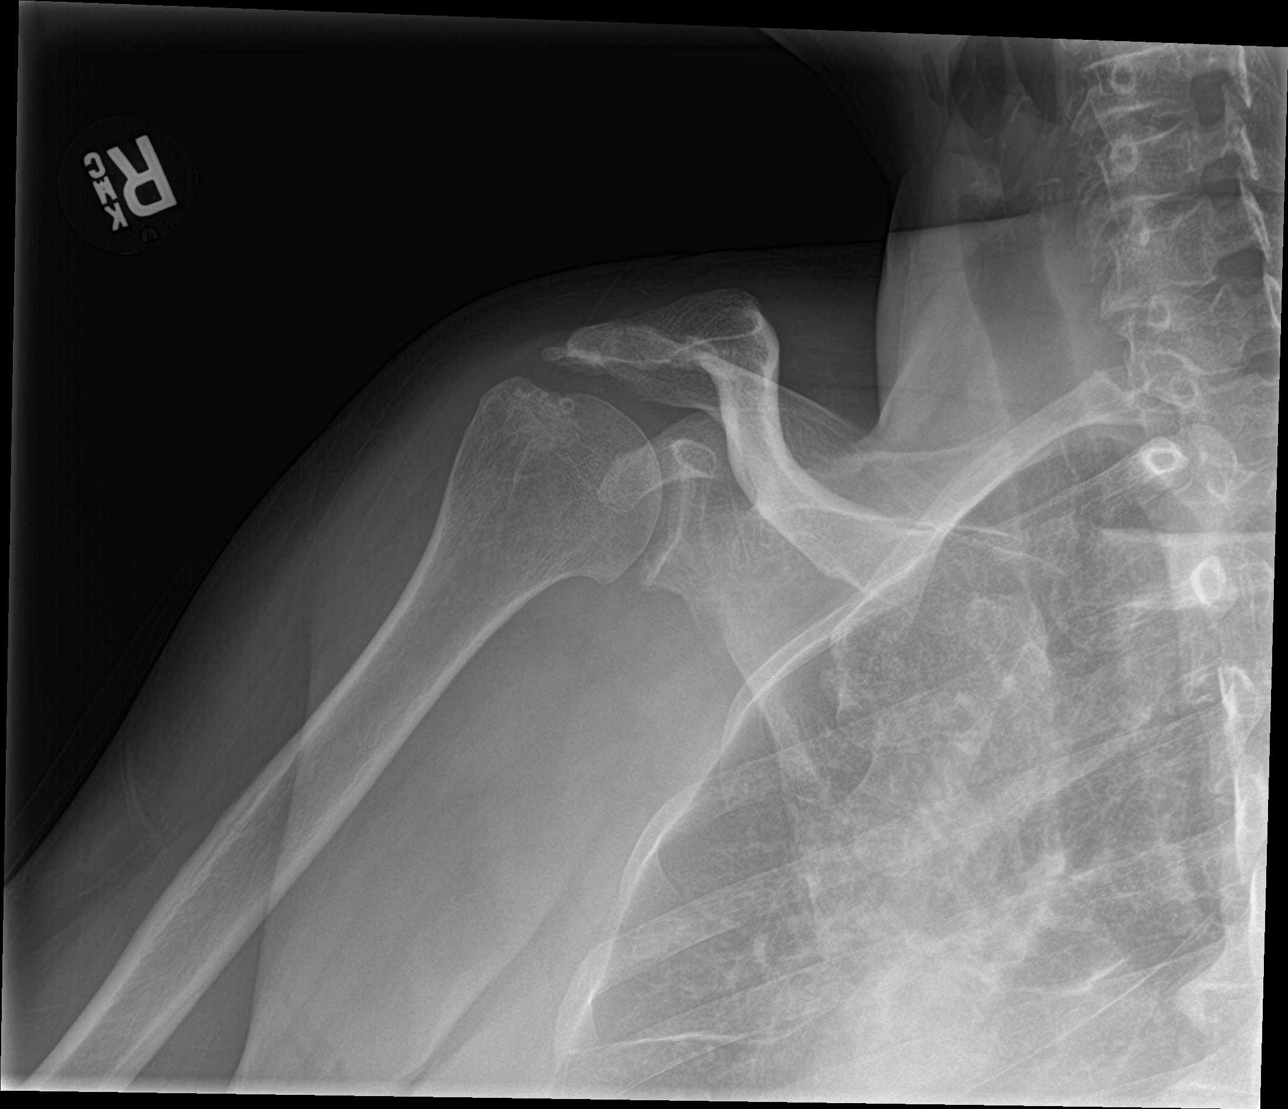

[shoulder y view]
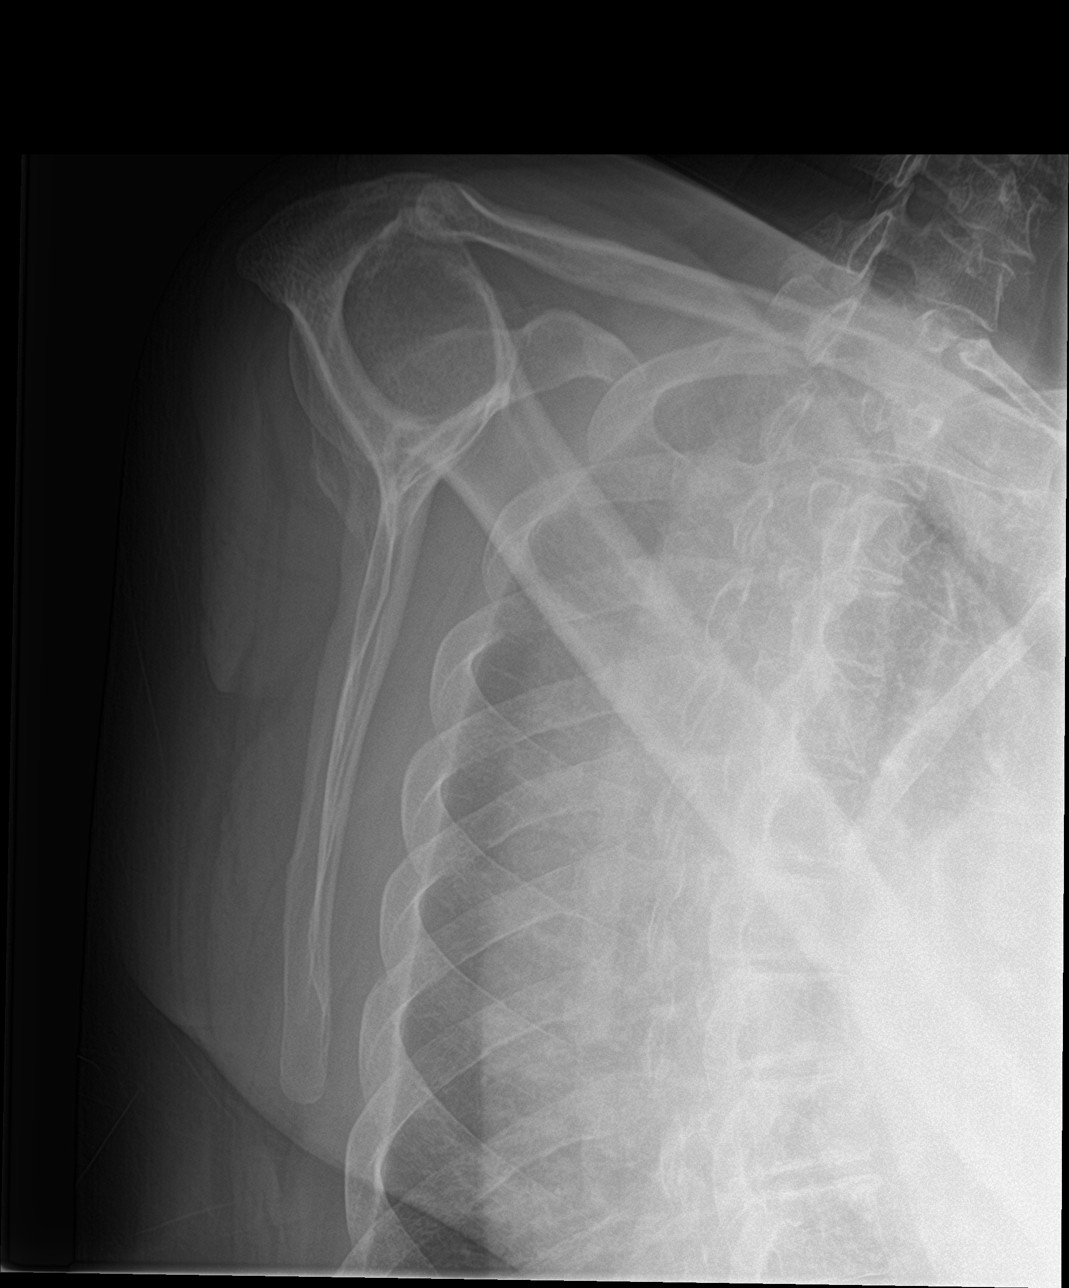

[shoulder axillary]
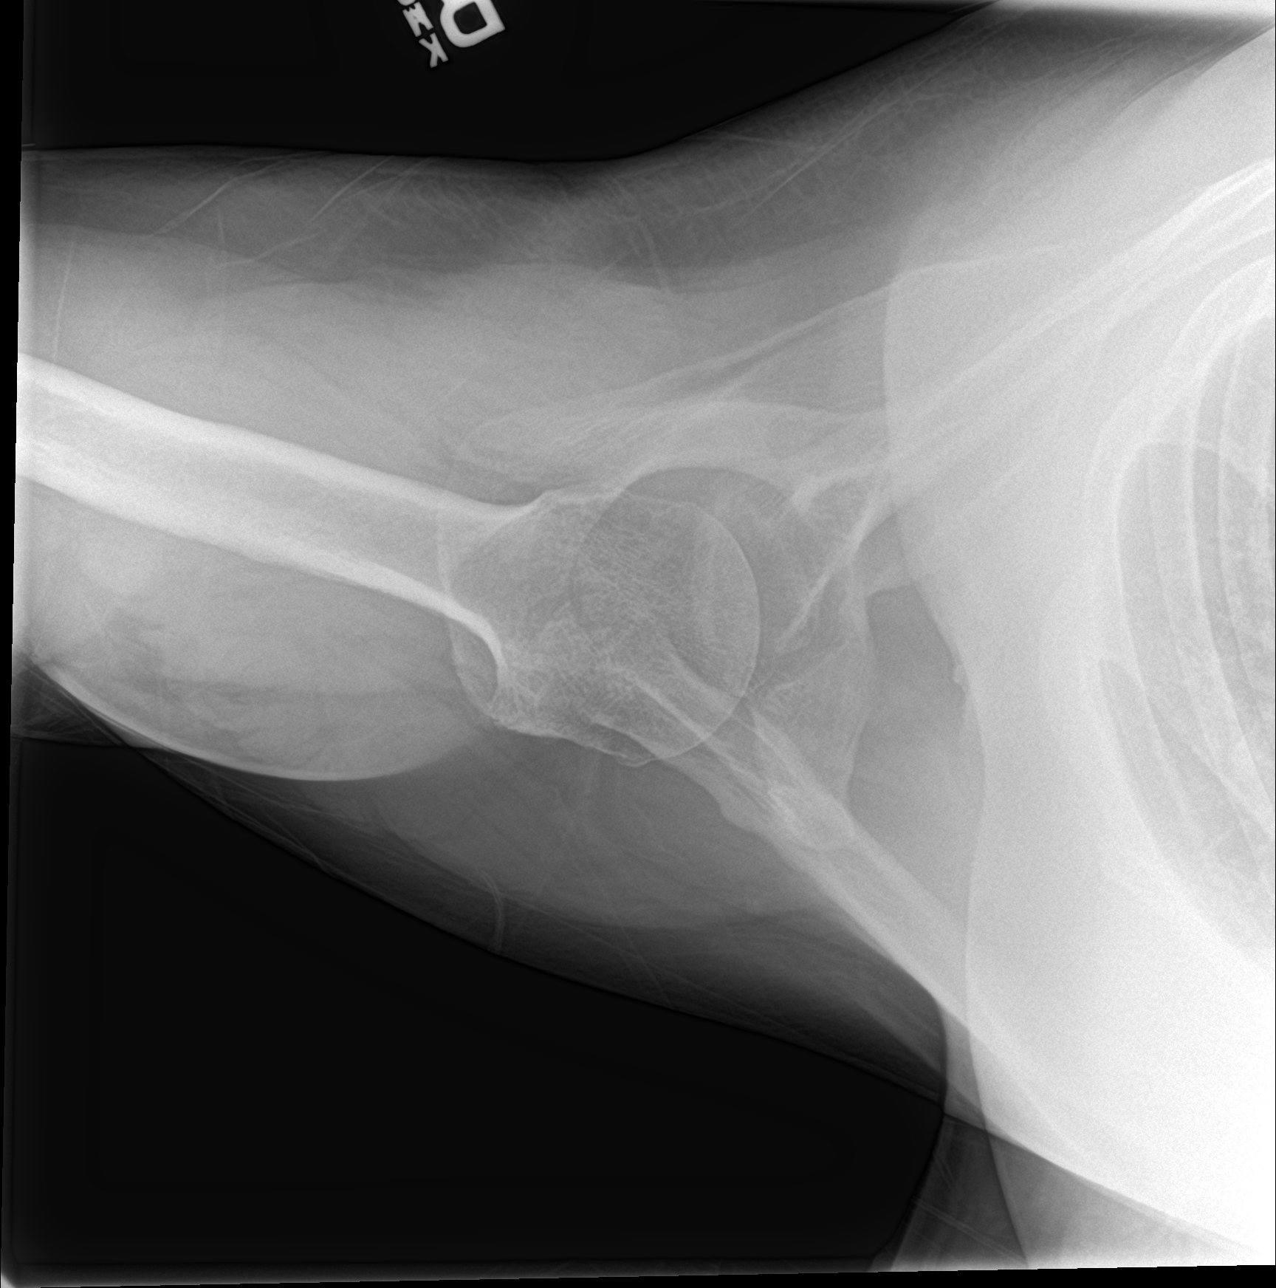

[3 of 3 positions shown; findings below may reference images not displayed]

FINDINGS: Glenohumeral joint is intact. No evidence of scapular fracture or
humeral fracture. The acromioclavicular joint is intact.
IMPRESSION: No fracture or dislocation.

## 2018-07-26 IMAGING — DX DG ELBOW COMPLETE 3+V*R*
4 series · 4 of 4 positions shown · non-contrast
Comparison: [DATE]

CLINICAL DATA: Pt fell yesterday and has lateral right elbow pain
with some swelling lateral and distal to the joint. pain

EXAM:
RIGHT ELBOW - COMPLETE 3+ VIEW

[elbow ap]
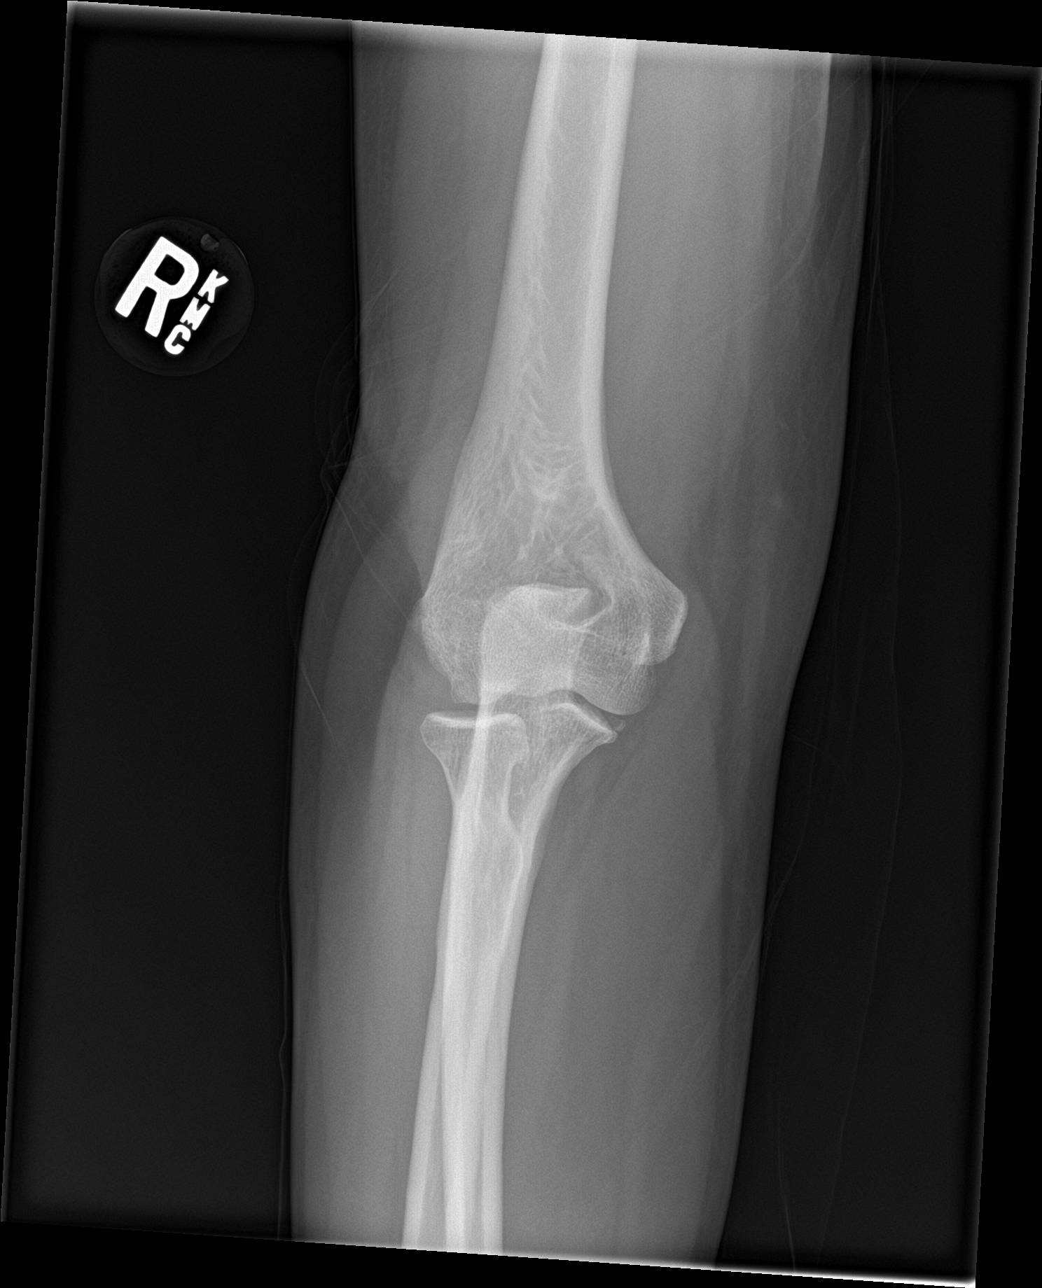

[elbow obl (1 of 2)]
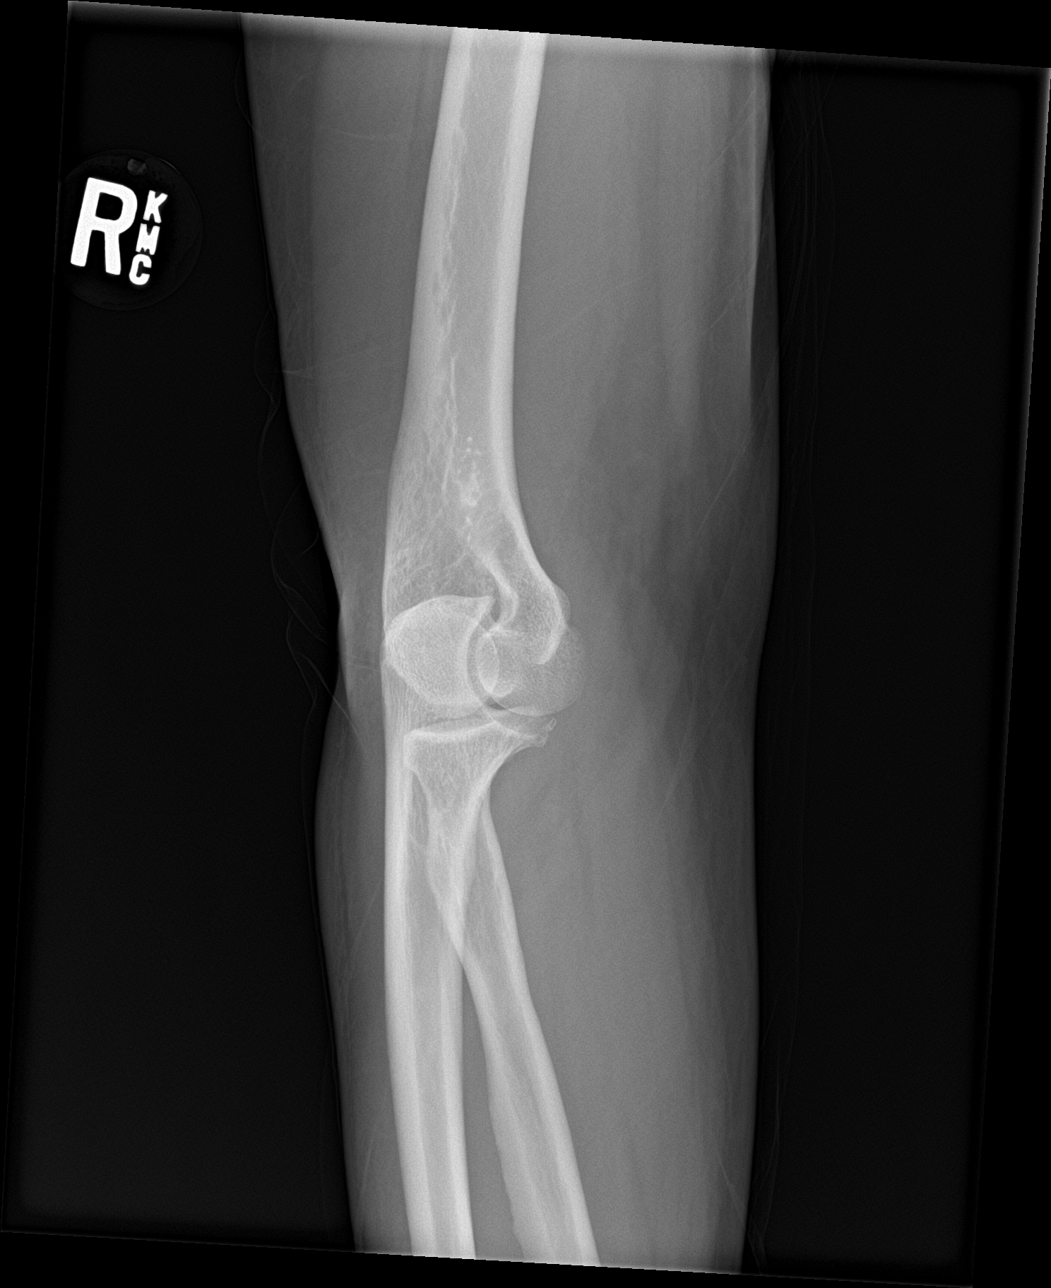

[elbow lat]
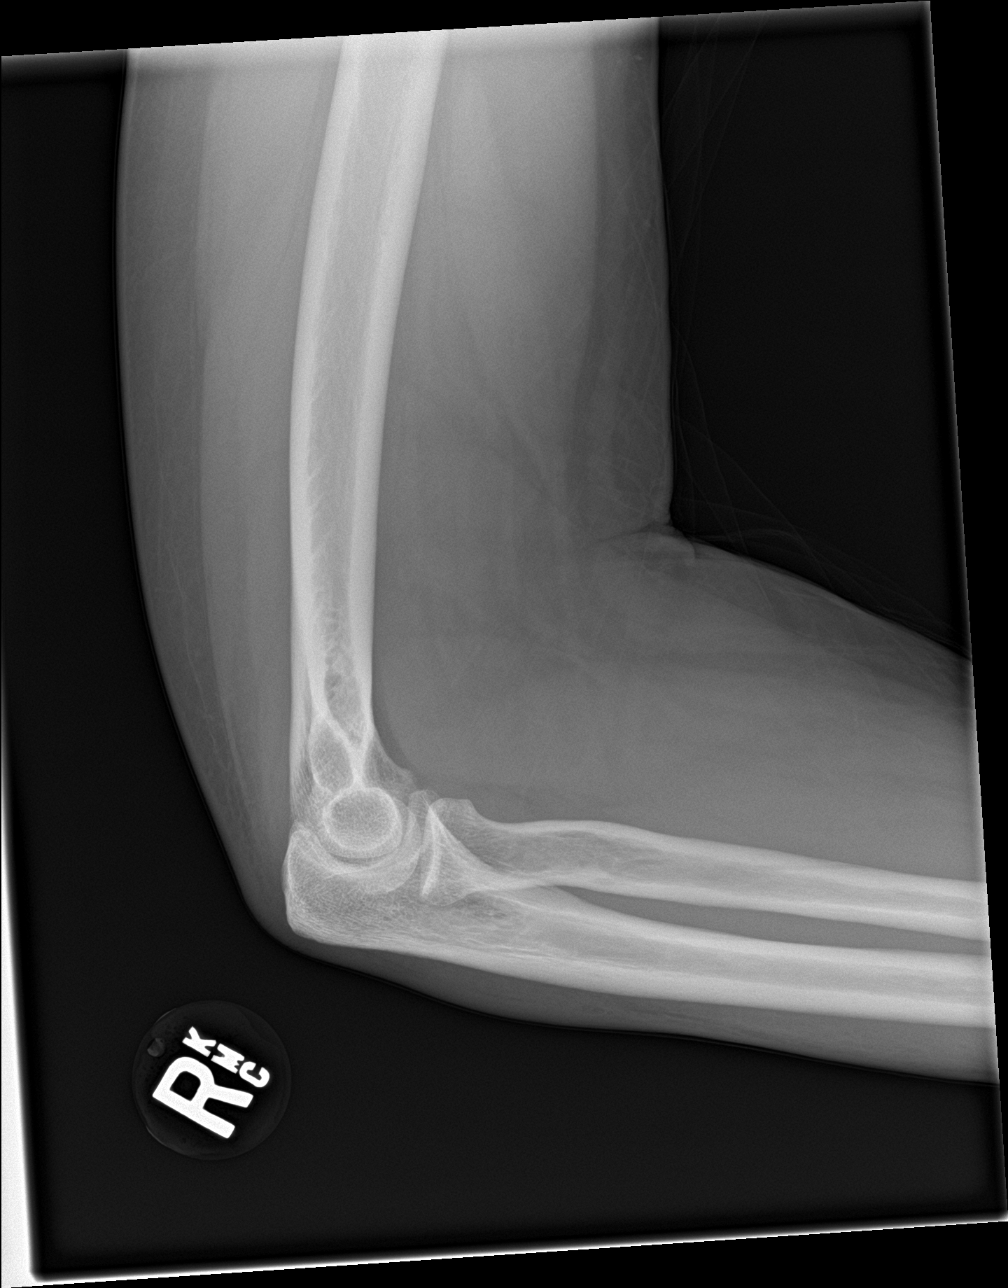

[elbow obl (2 of 2)]
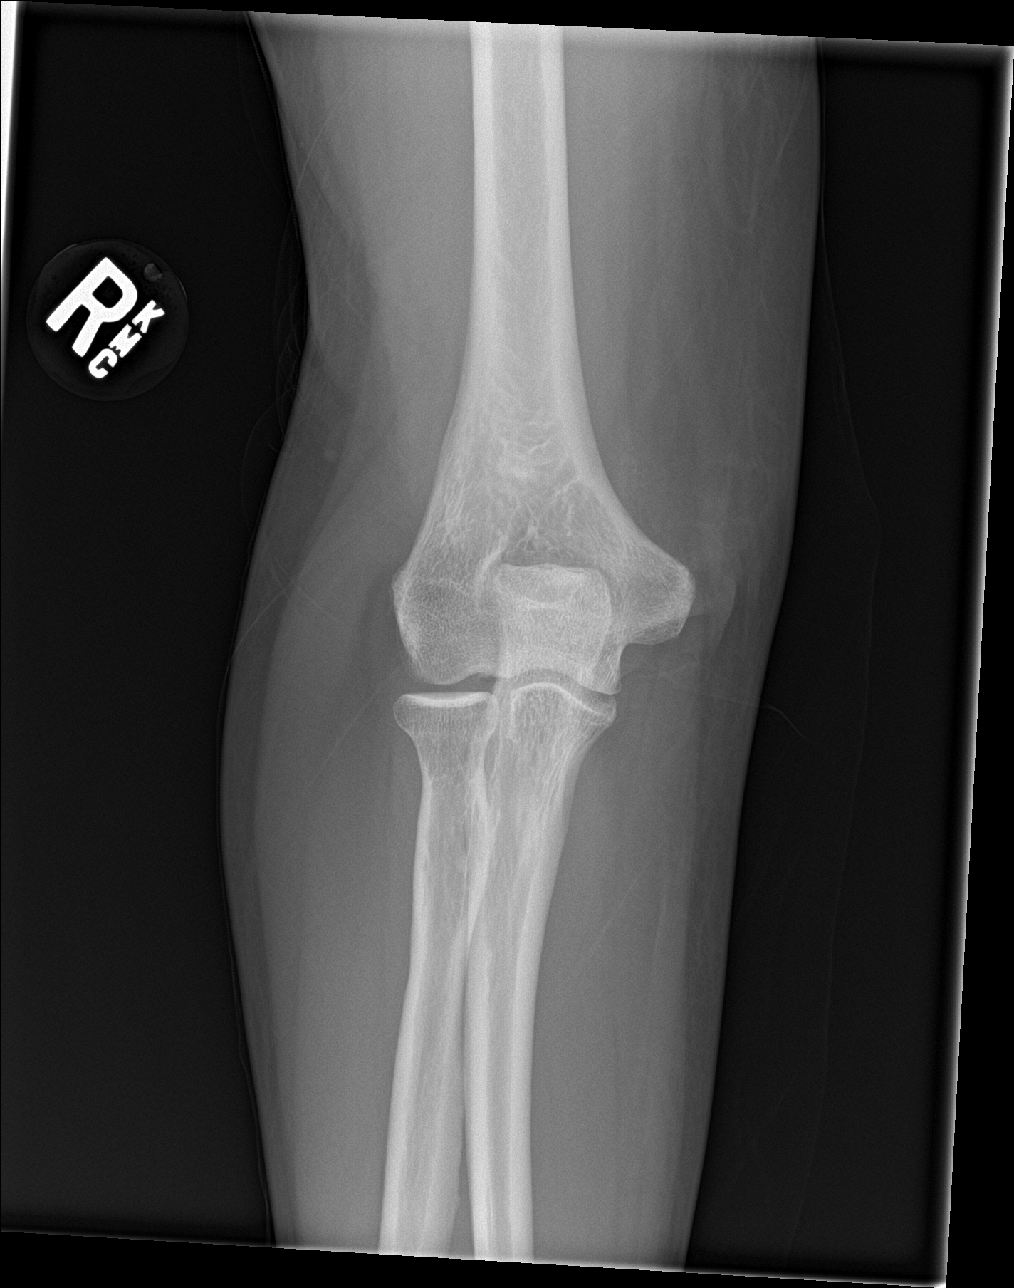

[4 of 4 positions shown; findings below may reference images not displayed]

FINDINGS: No evidence of fracture of the ulna or humerus. The radial head is
normal. No joint effusion.
IMPRESSION: No fracture or dislocation.

## 2018-07-26 MED ORDER — ACETAMINOPHEN 325 MG PO TABS
650.0000 mg | ORAL_TABLET | Freq: Once | ORAL | Status: AC
  Administered 2018-07-26: 650 mg via ORAL
  Filled 2018-07-26: qty 2

## 2018-07-26 MED ORDER — ACETAMINOPHEN ER 650 MG PO TBCR: 650.0000 mg | EXTENDED_RELEASE_TABLET | Freq: Three times a day (TID) | ORAL | 0 refills | Status: DC

## 2018-07-26 MED ORDER — METHOCARBAMOL 500 MG PO TABS: 500.0000 mg | ORAL_TABLET | Freq: Once | ORAL | Status: DC

## 2018-07-26 MED ORDER — IBUPROFEN 400 MG PO TABS
600.0000 mg | ORAL_TABLET | Freq: Once | ORAL | Status: AC
  Administered 2018-07-26: 600 mg via ORAL
  Filled 2018-07-26: qty 2

## 2018-07-26 MED ORDER — IBUPROFEN 400 MG PO TABS: 400.0000 mg | ORAL_TABLET | Freq: Four times a day (QID) | ORAL | 0 refills | Status: DC | PRN

## 2018-07-26 MED ORDER — LIDOCAINE 5 % EX PTCH
1.0000 | MEDICATED_PATCH | Freq: Once | CUTANEOUS | Status: DC
  Administered 2018-07-26: 1 via TRANSDERMAL
  Filled 2018-07-26: qty 1
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
## 2018-07-26 NOTE — Discharge Instructions (Addendum)
The x-rays do not show any fractures.  We suspect that you have contusion and that the symptoms should improve over time.  Please ice the area of pain for the first 24 hours, then you can alternate between ice and heat therapy.  Take the medications prescribed for pain control.

## 2018-07-26 NOTE — ED Triage Notes (Addendum)
Pt reports falling yesterday afternoon at work landing on right side of body. Reports "whole right side hurts"  PT DROVE HERE PER REPORT.

## 2018-08-24 ENCOUNTER — Encounter (HOSPITAL_COMMUNITY): Payer: Self-pay | Admitting: *Deleted

## 2018-08-24 ENCOUNTER — Other Ambulatory Visit: Payer: Self-pay

## 2018-08-24 ENCOUNTER — Emergency Department (HOSPITAL_COMMUNITY): Payer: Self-pay

## 2018-08-24 ENCOUNTER — Inpatient Hospital Stay (HOSPITAL_COMMUNITY)
Admission: EM | Admit: 2018-08-24 | Discharge: 2018-08-25 | DRG: 291 | Disposition: A | Discharge status: Home / Self Care | Payer: Self-pay | Attending: Internal Medicine | Admitting: Internal Medicine

## 2018-08-24 ENCOUNTER — Inpatient Hospital Stay (HOSPITAL_COMMUNITY): Payer: Self-pay

## 2018-08-24 DIAGNOSIS — I10 Essential (primary) hypertension: Secondary | ICD-10-CM | POA: Diagnosis present

## 2018-08-24 DIAGNOSIS — Z7982 Long term (current) use of aspirin: Secondary | ICD-10-CM

## 2018-08-24 DIAGNOSIS — K219 Gastro-esophageal reflux disease without esophagitis: Secondary | ICD-10-CM | POA: Diagnosis present

## 2018-08-24 DIAGNOSIS — Z9114 Patient's other noncompliance with medication regimen: Secondary | ICD-10-CM

## 2018-08-24 DIAGNOSIS — I428 Other cardiomyopathies: Secondary | ICD-10-CM | POA: Diagnosis present

## 2018-08-24 DIAGNOSIS — F1290 Cannabis use, unspecified, uncomplicated: Secondary | ICD-10-CM | POA: Diagnosis present

## 2018-08-24 DIAGNOSIS — Q231 Congenital insufficiency of aortic valve: Secondary | ICD-10-CM

## 2018-08-24 DIAGNOSIS — Z90710 Acquired absence of both cervix and uterus: Secondary | ICD-10-CM

## 2018-08-24 DIAGNOSIS — Z9049 Acquired absence of other specified parts of digestive tract: Secondary | ICD-10-CM

## 2018-08-24 DIAGNOSIS — J449 Chronic obstructive pulmonary disease, unspecified: Secondary | ICD-10-CM | POA: Diagnosis present

## 2018-08-24 DIAGNOSIS — Z880 Allergy status to penicillin: Secondary | ICD-10-CM

## 2018-08-24 DIAGNOSIS — Z794 Long term (current) use of insulin: Secondary | ICD-10-CM

## 2018-08-24 DIAGNOSIS — I370 Nonrheumatic pulmonary valve stenosis: Secondary | ICD-10-CM

## 2018-08-24 DIAGNOSIS — I5021 Acute systolic (congestive) heart failure: Secondary | ICD-10-CM

## 2018-08-24 DIAGNOSIS — I361 Nonrheumatic tricuspid (valve) insufficiency: Secondary | ICD-10-CM

## 2018-08-24 DIAGNOSIS — Z886 Allergy status to analgesic agent status: Secondary | ICD-10-CM

## 2018-08-24 DIAGNOSIS — R739 Hyperglycemia, unspecified: Secondary | ICD-10-CM

## 2018-08-24 DIAGNOSIS — I509 Heart failure, unspecified: Secondary | ICD-10-CM

## 2018-08-24 DIAGNOSIS — R0789 Other chest pain: Secondary | ICD-10-CM

## 2018-08-24 DIAGNOSIS — F1410 Cocaine abuse, uncomplicated: Secondary | ICD-10-CM

## 2018-08-24 DIAGNOSIS — M109 Gout, unspecified: Secondary | ICD-10-CM | POA: Diagnosis present

## 2018-08-24 DIAGNOSIS — Z79899 Other long term (current) drug therapy: Secondary | ICD-10-CM

## 2018-08-24 DIAGNOSIS — Z833 Family history of diabetes mellitus: Secondary | ICD-10-CM

## 2018-08-24 DIAGNOSIS — F17210 Nicotine dependence, cigarettes, uncomplicated: Secondary | ICD-10-CM | POA: Diagnosis present

## 2018-08-24 DIAGNOSIS — I160 Hypertensive urgency: Secondary | ICD-10-CM | POA: Diagnosis present

## 2018-08-24 DIAGNOSIS — Z91030 Bee allergy status: Secondary | ICD-10-CM

## 2018-08-24 DIAGNOSIS — I440 Atrioventricular block, first degree: Secondary | ICD-10-CM | POA: Diagnosis present

## 2018-08-24 DIAGNOSIS — R072 Precordial pain: Secondary | ICD-10-CM | POA: Diagnosis present

## 2018-08-24 DIAGNOSIS — E669 Obesity, unspecified: Secondary | ICD-10-CM | POA: Diagnosis present

## 2018-08-24 DIAGNOSIS — I110 Hypertensive heart disease with heart failure: Principal | ICD-10-CM | POA: Diagnosis present

## 2018-08-24 DIAGNOSIS — E1165 Type 2 diabetes mellitus with hyperglycemia: Secondary | ICD-10-CM | POA: Diagnosis present

## 2018-08-24 DIAGNOSIS — E118 Type 2 diabetes mellitus with unspecified complications: Secondary | ICD-10-CM

## 2018-08-24 LAB — CBC
HCT: 45 % (ref 36.0–46.0)
Hemoglobin: 14.3 g/dL (ref 12.0–15.0)
MCH: 27.1 pg (ref 26.0–34.0)
MCHC: 31.8 g/dL (ref 30.0–36.0)
MCV: 85.4 fL (ref 80.0–100.0)
Platelets: 230 10*3/uL (ref 150–400)
RBC: 5.27 MIL/uL — ABNORMAL HIGH (ref 3.87–5.11)
RDW: 13.2 % (ref 11.5–15.5)
WBC: 6.9 10*3/uL (ref 4.0–10.5)
nRBC: 0 % (ref 0.0–0.2)

## 2018-08-24 LAB — RAPID URINE DRUG SCREEN, HOSP PERFORMED
Amphetamines: NOT DETECTED
Barbiturates: NOT DETECTED
Benzodiazepines: NOT DETECTED
Cocaine: POSITIVE — AB
Opiates: POSITIVE — AB
Tetrahydrocannabinol: NOT DETECTED

## 2018-08-24 LAB — URINALYSIS, COMPLETE (UACMP) WITH MICROSCOPIC
Bacteria, UA: NONE SEEN
Bilirubin Urine: NEGATIVE
Glucose, UA: >=500 mg/dL — AB
Hgb urine dipstick: NEGATIVE
Ketones, ur: NEGATIVE mg/dL
Leukocytes, UA: NEGATIVE
Nitrite: NEGATIVE
Protein, ur: NEGATIVE mg/dL
Specific Gravity, Urine: 1.028 (ref 1.005–1.030)
pH: 6 (ref 5.0–8.0)

## 2018-08-24 LAB — BASIC METABOLIC PANEL
Anion gap: 11 (ref 5–15)
BUN: 15 (ref 4–21)
BUN: 15 mg/dL (ref 6–20)
CO2: 22 mmol/L (ref 22–32)
Calcium: 9 mg/dL (ref 8.9–10.3)
Chloride: 101 mmol/L (ref 98–111)
Creatinine, Ser: 0.81 mg/dL (ref 0.44–1.00)
Creatinine: 0.8 (ref 0.5–1.1)
GFR calc Af Amer: >60 mL/min (ref >60)
GFR calc non Af Amer: >60 mL/min (ref >60)
Glucose, Bld: 364 mg/dL — ABNORMAL HIGH (ref 70–99)
Glucose: 364
Potassium: 3.9 mmol/L (ref 3.5–5.1)
Sodium: 134 mmol/L — ABNORMAL LOW (ref 135–145)
Sodium: 134 — AB (ref 137–147)

## 2018-08-24 LAB — HEMOGLOBIN A1C
Hgb A1c MFr Bld: 11.3 % — ABNORMAL HIGH (ref 4.8–5.6)
Mean Plasma Glucose: 277.61 mg/dL

## 2018-08-24 LAB — D-DIMER, QUANTITATIVE: D-Dimer, Quant: 0.54 ug/mL-FEU — ABNORMAL HIGH (ref 0.00–0.50)

## 2018-08-24 LAB — ECHOCARDIOGRAM COMPLETE
Height: 66 in
Weight: 3068.8 oz

## 2018-08-24 LAB — GLUCOSE, CAPILLARY: Glucose-Capillary: 301 mg/dL — ABNORMAL HIGH (ref 70–99)

## 2018-08-24 LAB — CBC AND DIFFERENTIAL: WBC: 6.9

## 2018-08-24 LAB — TROPONIN I
Troponin I: <0.03 ng/mL (ref <0.03)
Troponin I: <0.03 ng/mL (ref <0.03)
Troponin I: <0.03 ng/mL (ref <0.03)

## 2018-08-24 LAB — CBG MONITORING, ED: Glucose-Capillary: 398 mg/dL — ABNORMAL HIGH (ref 70–99)

## 2018-08-24 LAB — BRAIN NATRIURETIC PEPTIDE: B Natriuretic Peptide: 700 pg/mL — ABNORMAL HIGH (ref 0.0–100.0)

## 2018-08-24 IMAGING — DX DG CHEST 2V
2 series · 2 of 2 positions shown · non-contrast
Comparison: [DATE]

CLINICAL DATA: Midsternal chest pain with shortness of breath for 1
day

EXAM:
CHEST - 2 VIEW

[chest pa]
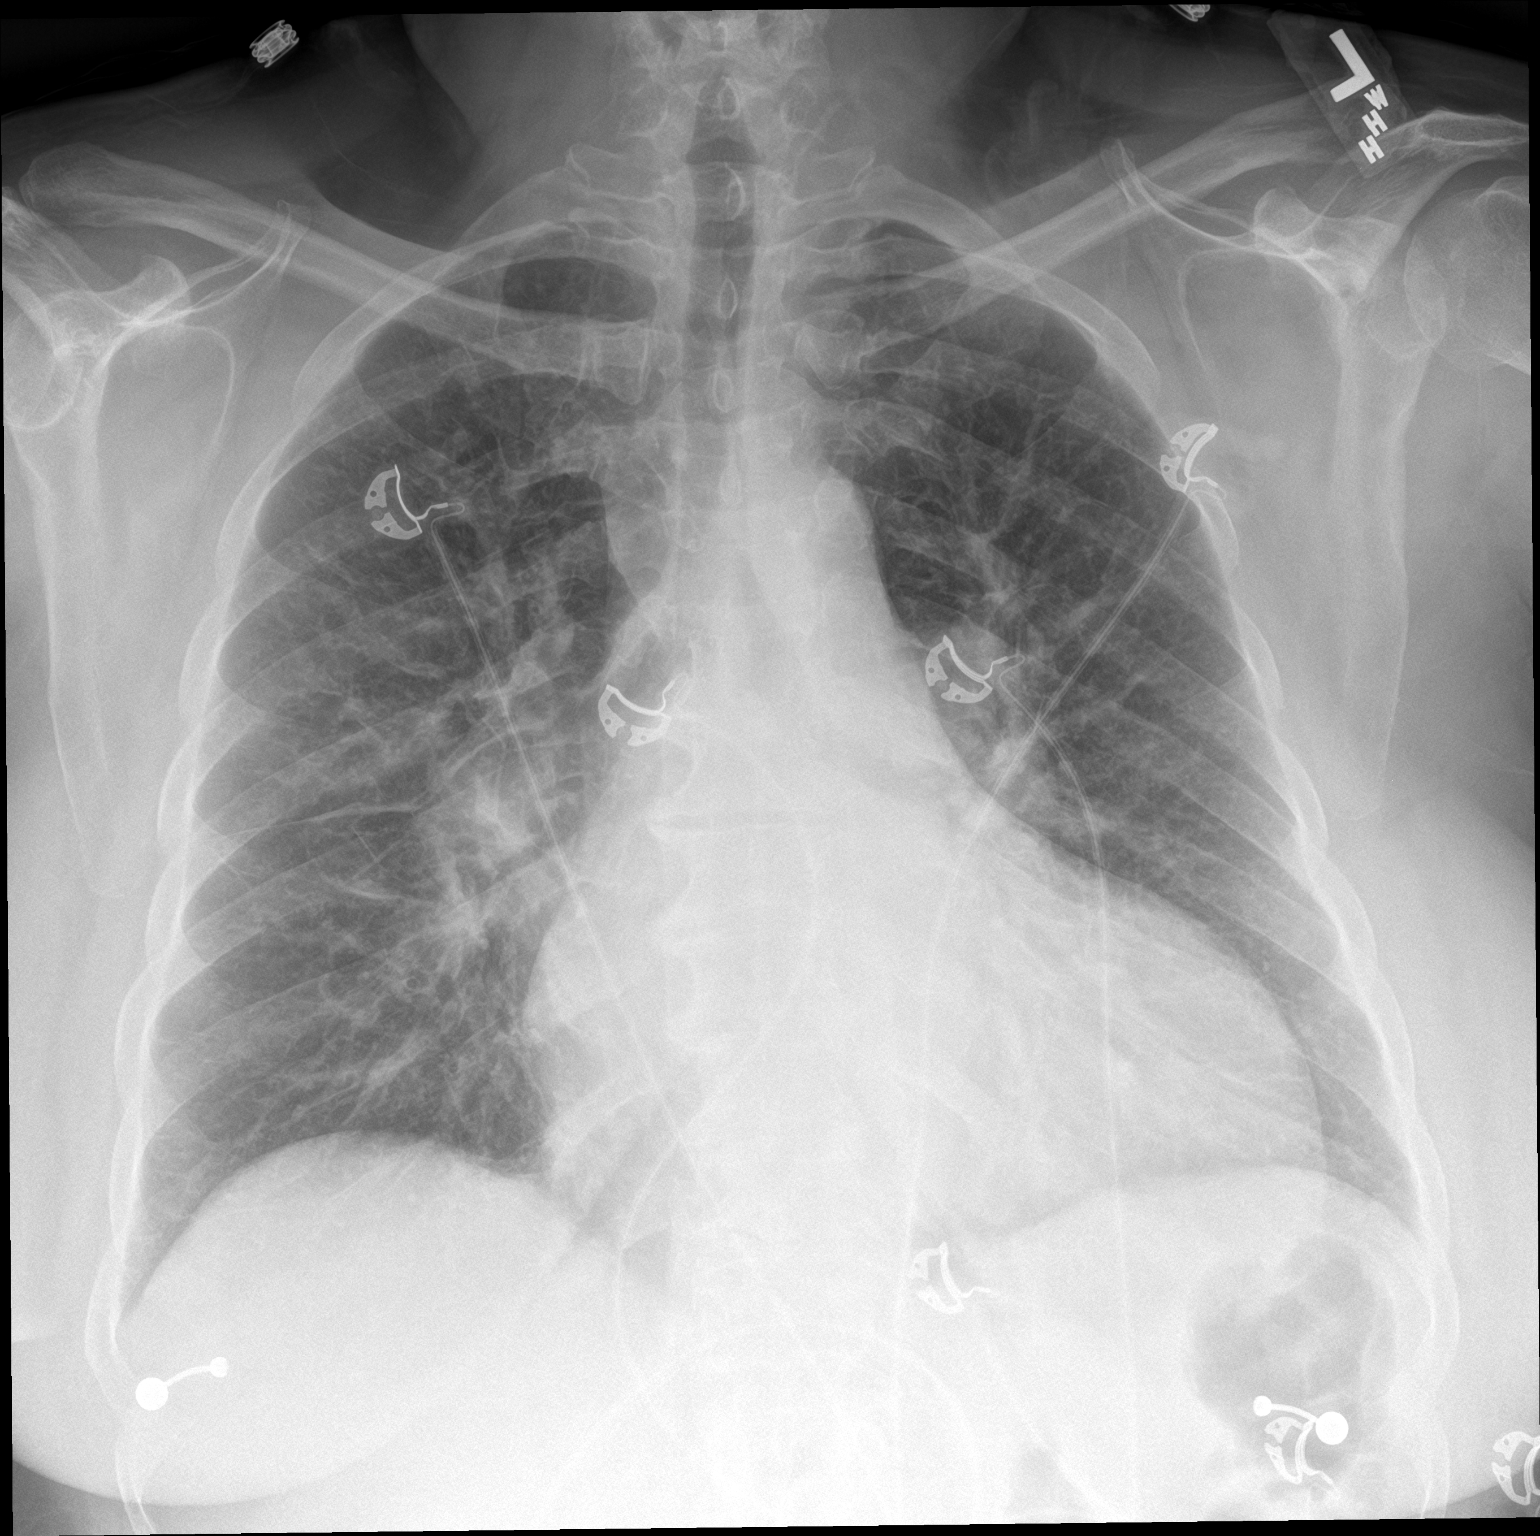

[chest lat]
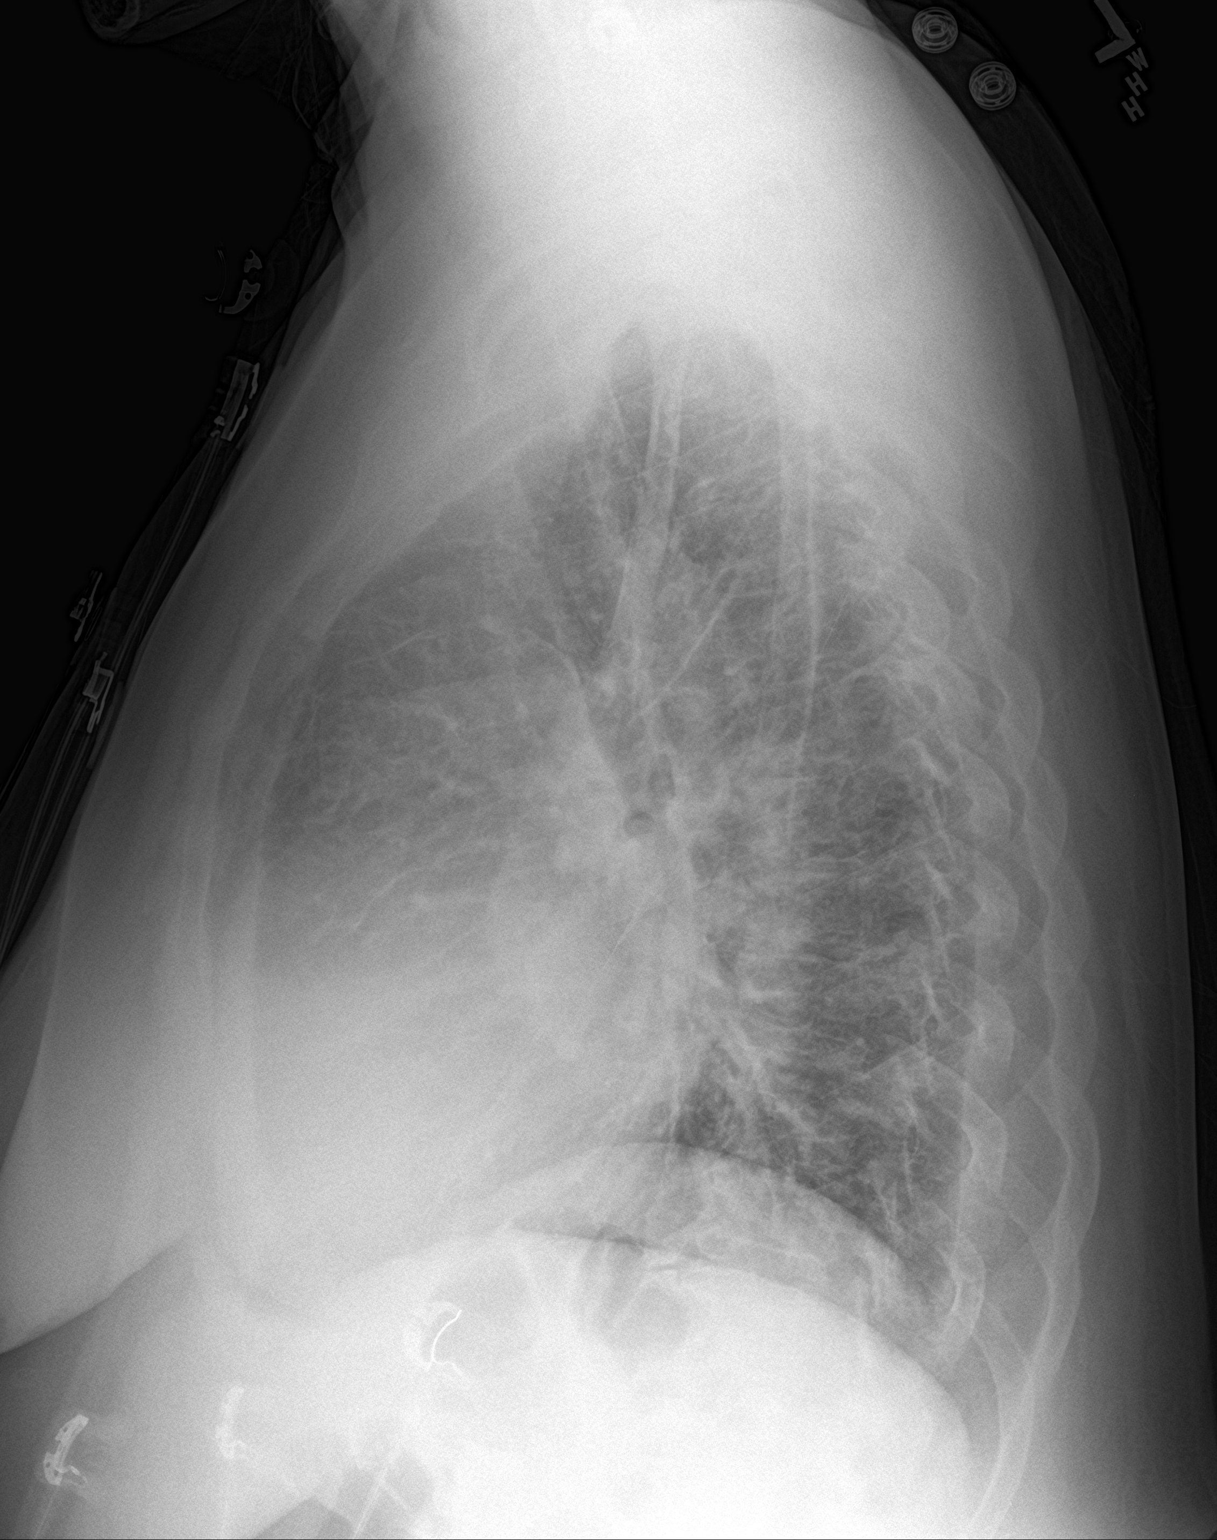

[2 of 2 positions shown; findings below may reference images not displayed]

FINDINGS: Cardiomegaly with diffuse interstitial coarsening and Kerley lines.
Negative aortic contours. Vascular fullness of the hila.
IMPRESSION: CHF.

## 2018-08-24 MED ORDER — ONDANSETRON HCL 4 MG/2ML IJ SOLN
4.0000 mg | Freq: Four times a day (QID) | INTRAMUSCULAR | Status: DC | PRN
  Administered 2018-08-25: 4 mg via INTRAVENOUS
  Filled 2018-08-24: qty 2

## 2018-08-24 MED ORDER — GUAIFENESIN-DM 100-10 MG/5ML PO SYRP
5.0000 mL | ORAL_SOLUTION | ORAL | Status: DC | PRN
  Administered 2018-08-24: 5 mL via ORAL
  Filled 2018-08-24: qty 5

## 2018-08-24 MED ORDER — FUROSEMIDE 10 MG/ML IJ SOLN
20.0000 mg | Freq: Two times a day (BID) | INTRAMUSCULAR | Status: DC
  Administered 2018-08-25: 20 mg via INTRAVENOUS
  Filled 2018-08-24: qty 2

## 2018-08-24 MED ORDER — INSULIN GLARGINE 100 UNIT/ML ~~LOC~~ SOLN
5.0000 [IU] | Freq: Every day | SUBCUTANEOUS | Status: DC
  Administered 2018-08-24: 5 [IU] via SUBCUTANEOUS
  Filled 2018-08-24 (×3): qty 0.05

## 2018-08-24 MED ORDER — FENTANYL CITRATE (PF) 100 MCG/2ML IJ SOLN
50.0000 ug | Freq: Once | INTRAMUSCULAR | Status: AC
  Administered 2018-08-24: 50 ug via INTRAVENOUS
  Filled 2018-08-24: qty 2

## 2018-08-24 MED ORDER — ASPIRIN EC 81 MG PO TBEC
81.0000 mg | DELAYED_RELEASE_TABLET | Freq: Every day | ORAL | Status: DC
  Administered 2018-08-24 – 2018-08-25 (×2): 81 mg via ORAL
  Filled 2018-08-24 (×2): qty 1

## 2018-08-24 MED ORDER — SODIUM CHLORIDE 0.9 % IV SOLN: 250.0000 mL | INTRAVENOUS | Status: DC | PRN

## 2018-08-24 MED ORDER — ENOXAPARIN SODIUM 40 MG/0.4ML ~~LOC~~ SOLN
40.0000 mg | SUBCUTANEOUS | Status: DC
  Administered 2018-08-24 – 2018-08-25 (×2): 40 mg via SUBCUTANEOUS
  Filled 2018-08-24 (×2): qty 0.4

## 2018-08-24 MED ORDER — PANTOPRAZOLE SODIUM 40 MG PO TBEC
40.0000 mg | DELAYED_RELEASE_TABLET | Freq: Every day | ORAL | Status: DC
  Administered 2018-08-24 – 2018-08-25 (×2): 40 mg via ORAL
  Filled 2018-08-24 (×2): qty 1

## 2018-08-24 MED ORDER — LOSARTAN POTASSIUM 50 MG PO TABS
25.0000 mg | ORAL_TABLET | Freq: Every day | ORAL | Status: DC
  Administered 2018-08-24: 25 mg via ORAL
  Filled 2018-08-24 (×2): qty 1

## 2018-08-24 MED ORDER — INSULIN ASPART 100 UNIT/ML ~~LOC~~ SOLN
SUBCUTANEOUS | Status: AC
  Filled 2018-08-24: qty 1

## 2018-08-24 MED ORDER — IPRATROPIUM-ALBUTEROL 0.5-2.5 (3) MG/3ML IN SOLN
3.0000 mL | Freq: Two times a day (BID) | RESPIRATORY_TRACT | Status: DC
  Administered 2018-08-25: 3 mL via RESPIRATORY_TRACT
  Filled 2018-08-24: qty 3

## 2018-08-24 MED ORDER — INSULIN ASPART 100 UNIT/ML ~~LOC~~ SOLN
0.0000 [IU] | Freq: Every day | SUBCUTANEOUS | Status: DC
  Administered 2018-08-24: 4 [IU] via SUBCUTANEOUS

## 2018-08-24 MED ORDER — ACETAMINOPHEN 325 MG PO TABS: 650.0000 mg | ORAL_TABLET | ORAL | Status: DC | PRN

## 2018-08-24 MED ORDER — METOPROLOL TARTRATE 25 MG PO TABS
25.0000 mg | ORAL_TABLET | Freq: Two times a day (BID) | ORAL | Status: DC
  Administered 2018-08-24 – 2018-08-25 (×3): 25 mg via ORAL
  Filled 2018-08-24 (×3): qty 1

## 2018-08-24 MED ORDER — IPRATROPIUM-ALBUTEROL 0.5-2.5 (3) MG/3ML IN SOLN
3.0000 mL | Freq: Three times a day (TID) | RESPIRATORY_TRACT | Status: DC
  Administered 2018-08-24 (×3): 3 mL via RESPIRATORY_TRACT
  Filled 2018-08-24 (×3): qty 3

## 2018-08-24 MED ORDER — NITROGLYCERIN 2 % TD OINT
1.0000 [in_us] | TOPICAL_OINTMENT | Freq: Once | TRANSDERMAL | Status: AC
  Administered 2018-08-24: 1 [in_us] via TOPICAL
  Filled 2018-08-24: qty 1

## 2018-08-24 MED ORDER — OXYCODONE-ACETAMINOPHEN 5-325 MG PO TABS
1.0000 | ORAL_TABLET | Freq: Four times a day (QID) | ORAL | Status: DC | PRN
  Administered 2018-08-24 – 2018-08-25 (×4): 1 via ORAL
  Filled 2018-08-24 (×4): qty 1

## 2018-08-24 MED ORDER — INSULIN ASPART 100 UNIT/ML ~~LOC~~ SOLN
0.0000 [IU] | Freq: Three times a day (TID) | SUBCUTANEOUS | Status: DC
  Administered 2018-08-24: 15 [IU] via SUBCUTANEOUS
  Administered 2018-08-24: 11 [IU] via SUBCUTANEOUS
  Administered 2018-08-25: 15 [IU] via SUBCUTANEOUS
  Administered 2018-08-25: 8 [IU] via SUBCUTANEOUS
  Filled 2018-08-24: qty 1

## 2018-08-24 MED ORDER — FUROSEMIDE 10 MG/ML IJ SOLN
40.0000 mg | Freq: Once | INTRAMUSCULAR | Status: AC
  Administered 2018-08-24: 40 mg via INTRAVENOUS
  Filled 2018-08-24: qty 4

## 2018-08-24 MED ORDER — SODIUM CHLORIDE 0.9% FLUSH
3.0000 mL | Freq: Two times a day (BID) | INTRAVENOUS | Status: DC
  Administered 2018-08-24 – 2018-08-25 (×3): 3 mL via INTRAVENOUS

## 2018-08-24 MED ORDER — HYDROCODONE-ACETAMINOPHEN 5-325 MG PO TABS
2.0000 | ORAL_TABLET | Freq: Once | ORAL | Status: AC
  Administered 2018-08-24: 2 via ORAL
  Filled 2018-08-24: qty 2

## 2018-08-24 MED ORDER — SODIUM CHLORIDE 0.9% FLUSH: 3.0000 mL | INTRAVENOUS | Status: DC | PRN

## 2018-08-24 NOTE — ED Notes (Signed)
Have paged pharmacy  

## 2018-08-24 NOTE — Progress Notes (Signed)
*  PRELIMINARY RESULTS* Echocardiogram 2D Echocardiogram has been performed.  Robin [name] 08/24/2018, 2:23 PM

## 2018-08-24 NOTE — ED Notes (Signed)
Respiratory called and made aware of duoneb order

## 2018-08-24 NOTE — ED Triage Notes (Signed)
Pt mid center chest pain that is worse with  Movement and respiration that started today along with cough that is productive with brown sputum.

## 2018-08-24 NOTE — ED Notes (Signed)
Pt reports that she has been out of her medications for the past month,

## 2018-08-24 NOTE — H&P (Signed)
History and Physical  [name] YDX:[id number] DOB: Apr 06, 1966 DOA: 08/24/2018   PCP: Celene Squibb, MD   Patient coming from: Home  Chief Complaint: Chest pain and shortness of breath  HPI:  Robin [name] is a 53 y.o. female with medical history of hypertension, COPD, diabetes mellitus, GERD presenting with 1 week history of shortness of breath and 1 day of substernal and left-sided chest discomfort.  She describes the chest discomfort as sharp and severe in nature that began on 08/23/2018.  She stated that she was watching TV when it began.  It has been constant since then.  She took 8 Advil on the day prior to admission with only minimal relief.  She states that lying supine makes it worse.  She has not had any nausea, vomiting, diarrhea, abdominal pain, dysuria, hematuria.  He has had some subjective fevers and chills nonproductive cough.  She denies any headache or neck pain.  She states that she has been out of all her medications for approximately 1 month.  She attributes this to a recent change in her insurance.  She states that she quit smoking 1-1/2 years ago after approximately 15-pack-year history.  In the emergency department, the patient was afebrile hemodynamically stable saturating 95-99% on room air.  BMP, CBC were unremarkable.  Chest x-ray showed increased interstitial markings suggestive of edema.  EKG shows sinus rhythm nonspecific T wave changes.  BNP was 700.  Patient was given furosemide 40 mg IV x1.  BNP 700  Assessment/Plan: Acute CHF, type unspecified -Work-up in progress -Daily weights -Echocardiogram -Continue Lasix IV  Atypical chest discomfort -Reproducible on examination -No improvement with nitroglycerin -EKG shows sinus rhythm nonspecific T wave changes -Cycle troponin -D-dimer -Echocardiogram  -Urine drug screen  Essential hypertension, uncontrolled -Restart metoprolol -Anticipate improvement with diuresis  Diabetes mellitus type 2,  uncontrolled with hyperglycemia -Hemoglobin A1c -NovoLog sliding scale -Start Lantus 5 units daily -Holding metformin  COPD -Start duo nebs -No wheezing on exam  GERD -Continue PPI   Past Medical History:  Diagnosis Date  . Arthritis   . CHF (congestive heart failure) ([place])   . Chronic abdominal pain   . COPD (chronic obstructive pulmonary disease) ([place])   . Diabetes mellitus   . Essential hypertension, benign   . GERD (gastroesophageal reflux disease)   . Gout   . Gout 2016  . Normal coronary arteries    3/10 - following abnormal Myoview  . Ovarian cyst   . Type 2 diabetes mellitus ([place])    Past Surgical History:  Procedure Laterality Date  . ABDOMINAL HYSTERECTOMY  09/10/2011   Procedure: HYSTERECTOMY ABDOMINAL;  Surgeon: Jonnie Kind, MD;  Location: [facility];  Service: Gynecology;  Laterality: N/A;  Abdominal hysterectomy  . CESAREAN SECTION  [id number], and 1994  . CHOLECYSTECTOMY  1995  . SCAR REVISION  09/10/2011   Procedure: SCAR REVISION;  Surgeon: Jonnie Kind, MD;  Location: [facility];  Service: Gynecology;  Laterality: N/A;  Wide Excision of old Cicatrix  . TUBAL LIGATION  1994   Social History:  reports that she has been smoking cigarettes. She has a 7.25 pack-year smoking history. She has never used smokeless tobacco. She reports that she does not drink alcohol or use drugs.   Family History  Problem Relation Age of Onset  . Cirrhosis Mother   . Diabetes type II Father   . Diabetes type II Sister   . Anesthesia problems Neg Hx   .  Hypotension Neg Hx   . Malignant hyperthermia Neg Hx   . Pseudochol deficiency Neg Hx      Allergy: Bee venom, PCN   Prior to Admission medications   Medication Sig Start Date End Date Taking? Authorizing Provider  acetaminophen (TYLENOL 8 HOUR) 650 MG CR tablet Take 1 tablet (650 mg total) by mouth every 8 (eight) hours. 07/26/18   Varney Biles, MD  albuterol (PROVENTIL HFA;VENTOLIN HFA) 108 (90 Base) MCG/ACT inhaler  Inhale 1-2 puffs into the lungs every 6 (six) hours as needed for wheezing or shortness of breath.    [provider]  albuterol (PROVENTIL) (2.5 MG/3ML) 0.083% nebulizer solution Take 3 mLs (2.5 mg total) by nebulization every 6 (six) hours as needed for wheezing or shortness of breath. 01/26/18   Nat Christen, MD  aspirin EC 81 MG tablet Take 1 tablet (81 mg total) by mouth daily. 07/12/16   Dhungel, Flonnie Overman, MD  furosemide (LASIX) 20 MG tablet Take 20 mg by mouth as needed for fluid.     [provider]  insulin aspart protamine- aspart (NOVOLOG MIX 70/30) (70-30) 100 UNIT/ML injection Take 15 units 3 times a day with meals 09/19/17   Milton Ferguson, MD  metFORMIN (GLUCOPHAGE) 1000 MG tablet Take 1 tablet (1,000 mg total) by mouth 2 (two) times daily with a meal. 07/12/16   Dhungel, Nishant, MD  metoprolol tartrate (LOPRESSOR) 25 MG tablet Take 25 mg by mouth 2 (two) times daily. 03/16/18   [provider]  omeprazole (PRILOSEC) 20 MG capsule Take 20 mg by mouth daily.    [provider]  omeprazole (PRILOSEC) 20 MG capsule Take 1 capsule (20 mg total) by mouth daily. 07/07/18   Horton, Barbette Hair, MD    Review of Systems:  Constitutional:  No weight loss, night sweats, Fevers, chills, fatigue.  Head&Eyes: No headache.  No vision loss.  No eye pain or scotoma ENT:  No Difficulty swallowing,Tooth/dental problems,Sore throat,  No ear ache, post nasal drip,  Cardio-vascular:  No chest pain, Orthopnea, PND, swelling in lower extremities,  dizziness, palpitations  GI:  No  abdominal pain, nausea, vomiting, diarrhea, loss of appetite, hematochezia, melena, heartburn, indigestion, Resp:   No coughing up of blood .No wheezing.No chest wall deformity  Skin:  no rash or lesions.  GU:  no dysuria, change in color of urine, no urgency or frequency. No flank pain.  Musculoskeletal:  No joint pain or swelling. No decreased range of motion. No back pain.  Psych:  No  change in mood or affect. No depression or anxiety. Neurologic: No headache, no dysesthesia, no focal weakness, no vision loss. No syncope  Physical Exam: Vitals:   08/24/18 0517 08/24/18 0519 08/24/18 0619 08/24/18 0702  BP:  (!) 171/118 (!) 182/115 (!) 190/108  Pulse:  85 79 91  Resp:  20 16 14   Temp:  (!) 97.5 F (36.4 C)    TempSrc:  Oral    SpO2:  99% 98% 95%  Weight: 87 kg     Height: 5\' 6"  (1.676 m)      General:  A&O x 3, NAD, nontoxic, pleasant/cooperative Head/Eye: No conjunctival hemorrhage, no icterus, [place]/AT, No nystagmus ENT:  No icterus,  No thrush, good dentition, no pharyngeal exudate Neck:  No masses, no lymphadenpathy, no bruits CV:  RRR, no rub, no gallop, no S3 Lung: Fine bibasilar crackles.  Minimal sounds the bases.  No wheezing Abdomen: soft/NT, +BS, nondistended, no peritoneal signs Ext: No cyanosis, No rashes,  No petechiae, No lymphangitis, trace lower extremity edema Neuro: CNII-XII intact, strength 4/5 in bilateral upper and lower extremities, no dysmetria  Labs on Admission:  Basic Metabolic Panel: Recent Labs  Lab 08/24/18 0531  NA 134*  K 3.9  CL 101  CO2 22  GLUCOSE 364*  BUN 15  CREATININE 0.81  CALCIUM 9.0   Liver Function Tests: No results for input(s): AST, ALT, ALKPHOS, BILITOT, PROT, ALBUMIN in the last 168 hours. No results for input(s): LIPASE, AMYLASE in the last 168 hours. No results for input(s): AMMONIA in the last 168 hours. CBC: Recent Labs  Lab 08/24/18 0531  WBC 6.9  HGB 14.3  HCT 45.0  MCV 85.4  PLT 230   Coagulation Profile: No results for input(s): INR, PROTIME in the last 168 hours. Cardiac Enzymes: Recent Labs  Lab 08/24/18 0531  TROPONINI <0.03   BNP: Invalid input(s): POCBNP CBG: No results for input(s): GLUCAP in the last 168 hours. Urine analysis:    Component Value Date/Time   COLORURINE YELLOW 08/13/2016 0723   APPEARANCEUR CLEAR 08/13/2016 0723   LABSPEC 1.014 08/13/2016 0723   PHURINE  7.0 08/13/2016 0723   GLUCOSEU 150 (A) 08/13/2016 0723   HGBUR NEGATIVE 08/13/2016 0723   BILIRUBINUR NEGATIVE 08/13/2016 0723   KETONESUR NEGATIVE 08/13/2016 0723   PROTEINUR NEGATIVE 08/13/2016 0723   UROBILINOGEN 0.2 03/03/2015 1359   NITRITE NEGATIVE 08/13/2016 0723   LEUKOCYTESUR NEGATIVE 08/13/2016 0723   Sepsis Labs: @LABRCNTIP (procalcitonin:4,lacticidven:4) )No results found for this or any previous visit (from the past 240 hour(s)).   Radiological Exams on Admission: Dg Chest 2 View  Result Date: 08/24/2018 CLINICAL DATA:  Midsternal chest pain with shortness of breath for 1 day EXAM: CHEST - 2 VIEW COMPARISON:  07/07/2018 FINDINGS: Cardiomegaly with diffuse interstitial coarsening and Kerley lines. Negative aortic contours. Vascular fullness of the hila. IMPRESSION: CHF. Electronically Signed   By: Monte Fantasia M.D.   On: 08/24/2018 05:43    EKG: Independently reviewed.  Sinus rhythm, nonspecific T wave changes    Time spent:60 minutes Code Status:   FULL Family Communication:  No Family at bedside Disposition Plan: expect 2-3 day hospitalization Consults called: none DVT Prophylaxis: [place] Lovenox  Orson Eva, DO  [facility] Pager [phone]  If 7PM-7AM, please contact night-coverage [email or web address] Password [facility] 08/24/2018, 7:49 AM
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
## 2018-08-24 NOTE — ED Notes (Signed)
Dr Tat at bedside 

## 2018-08-25 ENCOUNTER — Inpatient Hospital Stay (HOSPITAL_COMMUNITY): Payer: Self-pay

## 2018-08-25 DIAGNOSIS — F1410 Cocaine abuse, uncomplicated: Secondary | ICD-10-CM

## 2018-08-25 DIAGNOSIS — I5021 Acute systolic (congestive) heart failure: Secondary | ICD-10-CM

## 2018-08-25 DIAGNOSIS — F1490 Cocaine use, unspecified, uncomplicated: Secondary | ICD-10-CM

## 2018-08-25 DIAGNOSIS — I160 Hypertensive urgency: Secondary | ICD-10-CM

## 2018-08-25 LAB — GLUCOSE, CAPILLARY: Glucose-Capillary: 271 mg/dL — ABNORMAL HIGH (ref 70–99)

## 2018-08-25 LAB — BASIC METABOLIC PANEL
Anion gap: 9 (ref 5–15)
BUN: 20 mg/dL (ref 6–20)
CO2: 26 mmol/L (ref 22–32)
Calcium: 8.9 mg/dL (ref 8.9–10.3)
Chloride: 101 mmol/L (ref 98–111)
Creatinine, Ser: 0.89 mg/dL (ref 0.44–1.00)
GFR calc Af Amer: >60 mL/min (ref >60)
GFR calc non Af Amer: >60 mL/min (ref >60)
Glucose, Bld: 265 mg/dL — ABNORMAL HIGH (ref 70–99)
Potassium: 3.5 mmol/L (ref 3.5–5.1)
Sodium: 136 mmol/L (ref 135–145)

## 2018-08-25 LAB — HIV ANTIBODY (ROUTINE TESTING W REFLEX): HIV Screen 4th Generation wRfx: NONREACTIVE

## 2018-08-25 IMAGING — CT CT ANGIO CHEST
2 of 6 series · 18 of 46 positions shown · IV contrast (Isovue)
Comparison: [DATE] chest radiograph, [DATE] chest CT and
prior studies

CLINICAL DATA: 52-year-old female with acute shortness of breath
and chest pain. Elevated D-dimer.

EXAM:
CT ANGIOGRAPHY CHEST WITH CONTRAST
TECHNIQUE: Multidetector CT imaging of the chest was performed using the
standard protocol during bolus administration of intravenous
contrast. Multiplanar CT image reconstructions and MIPs were
obtained to evaluate the vascular anatomy.
CONTRAST:  100mL [GW] IOPAMIDOL ([GW]) INJECTION 76%

[Series 6: thins · axial · 0.65mm/px · z∈[-117,+144]mm · 15 of 287 slices shown]
[im 13/287  lung]
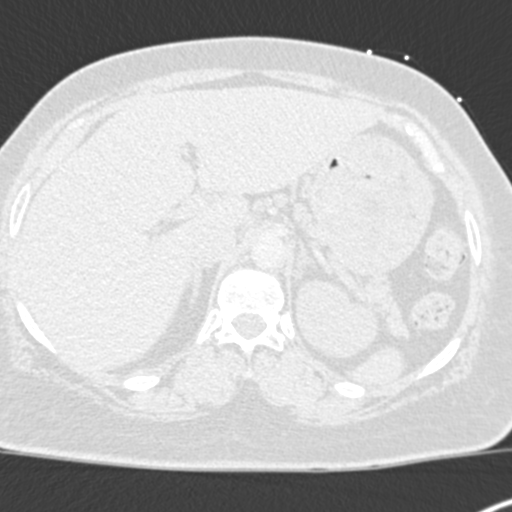
[im 38/287  soft-tissue]
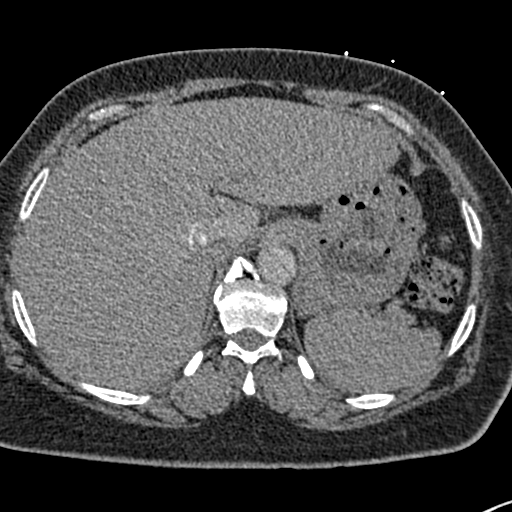
[im 50/287  lung]
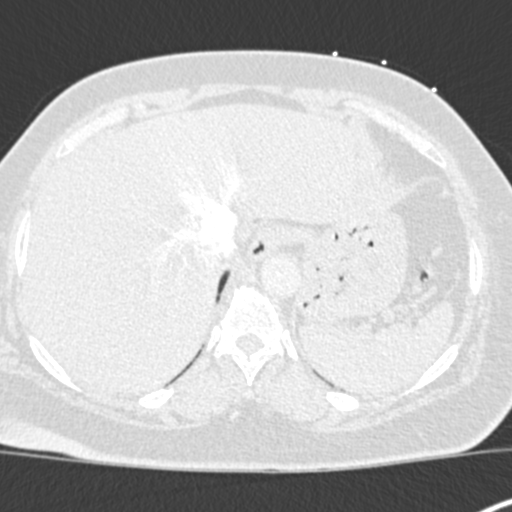
[im 75/287  soft-tissue]
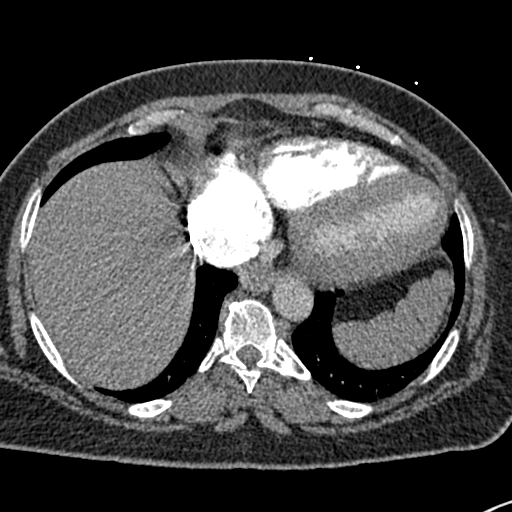
[im 88/287  lung]
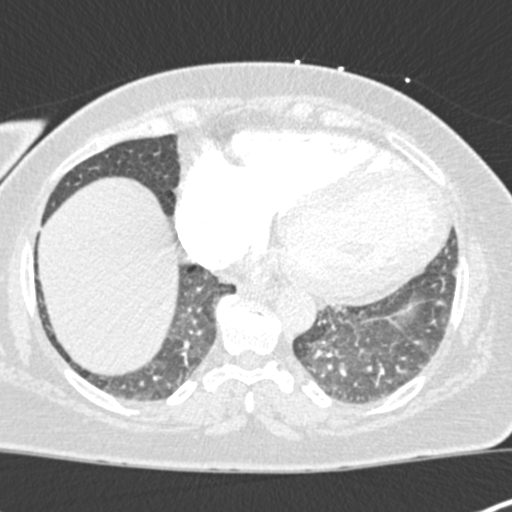
[im 112/287  soft-tissue]
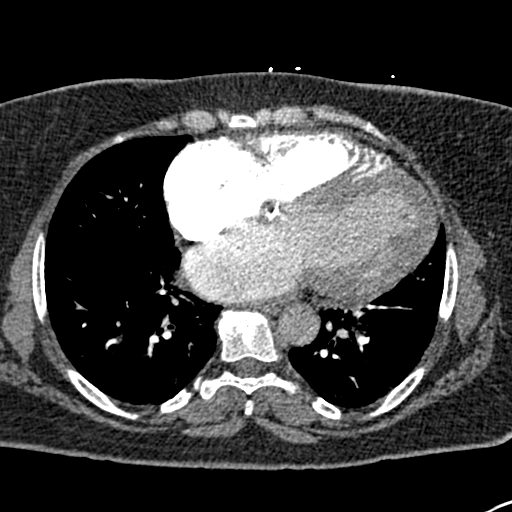
[im 125/287  lung]
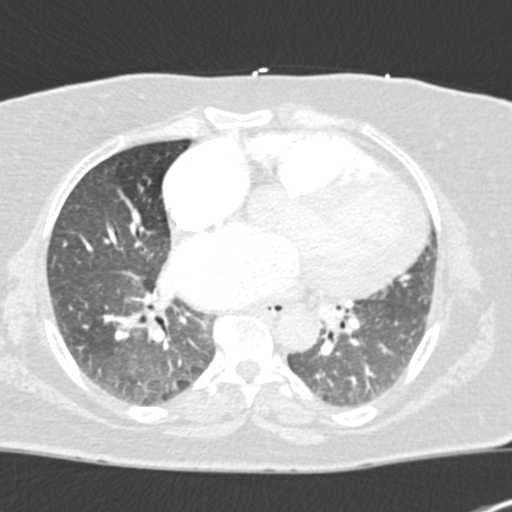
[im 150/287  soft-tissue]
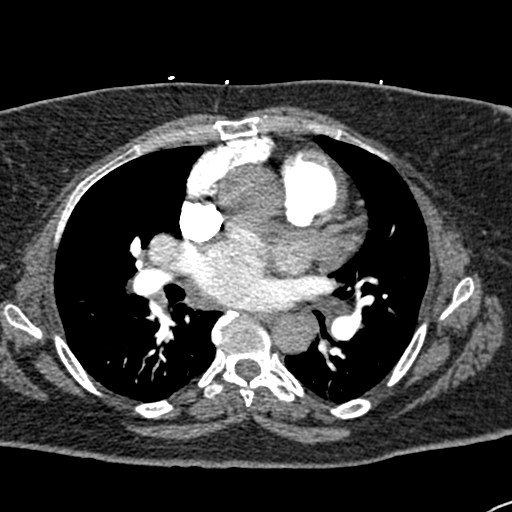
[im 162/287  lung]
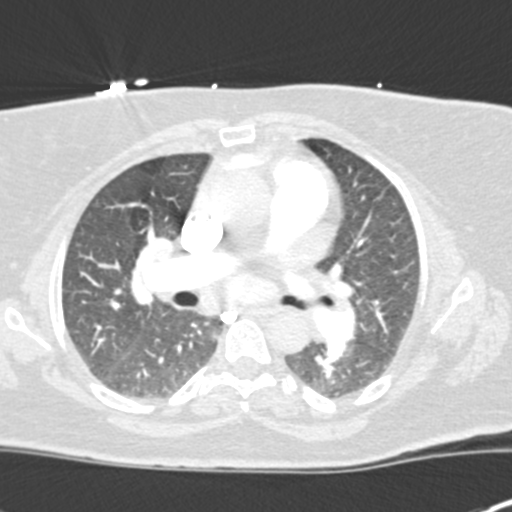
[im 175/287  soft-tissue]
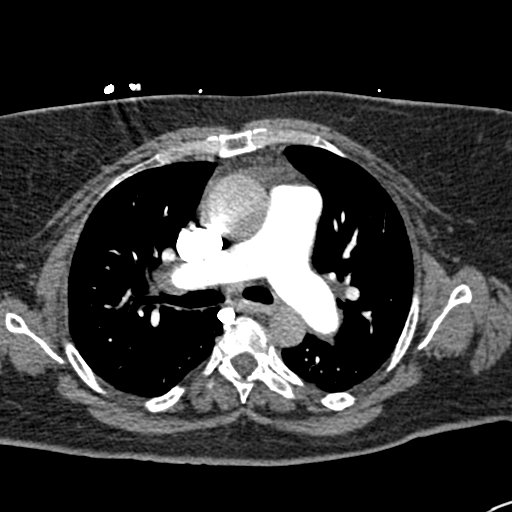
[im 199/287  lung]
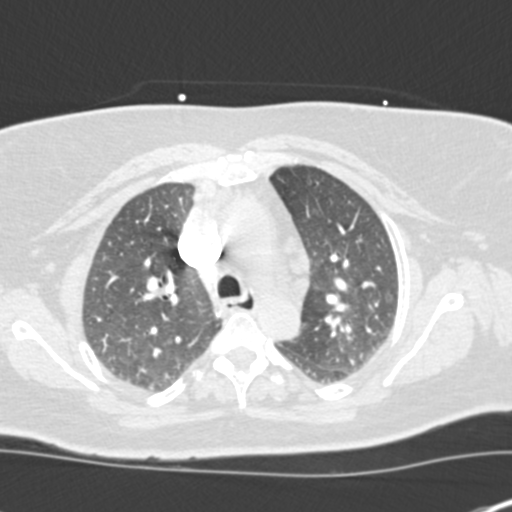
[im 212/287  soft-tissue]
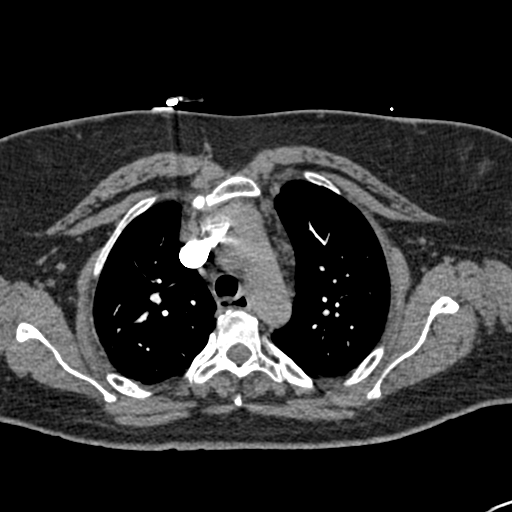
[im 237/287  lung]
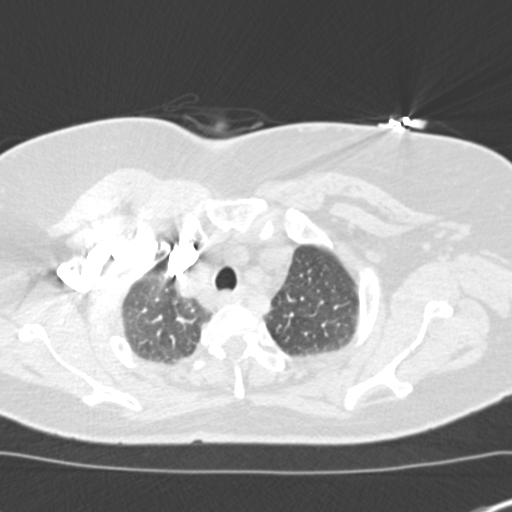
[im 249/287  soft-tissue]
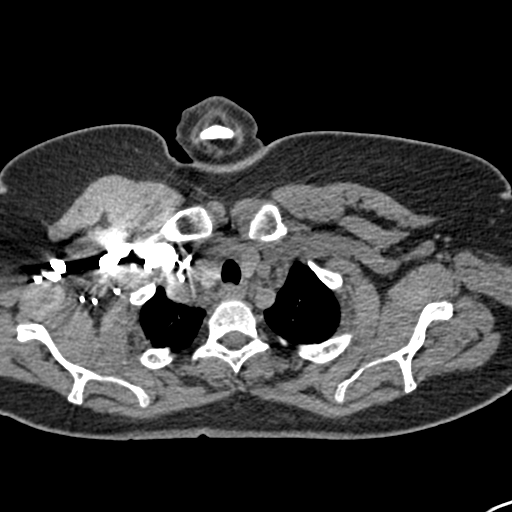
[im 274/287  lung]
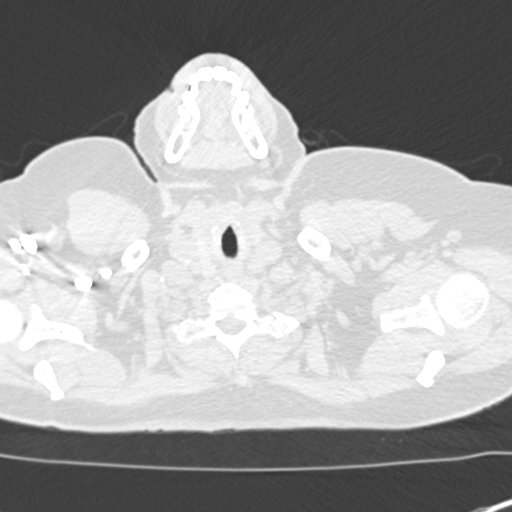

[Series 8: coronal mpr · coronal · 0.58mm/px · 3 of 151 slices shown]
[im 38/151  soft-tissue]
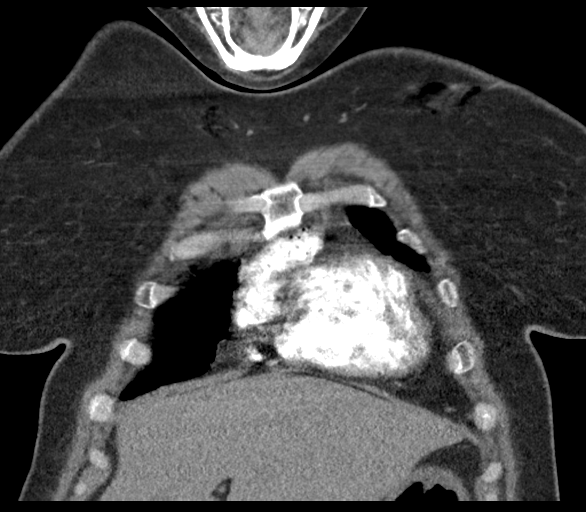
[im 76/151  soft-tissue]
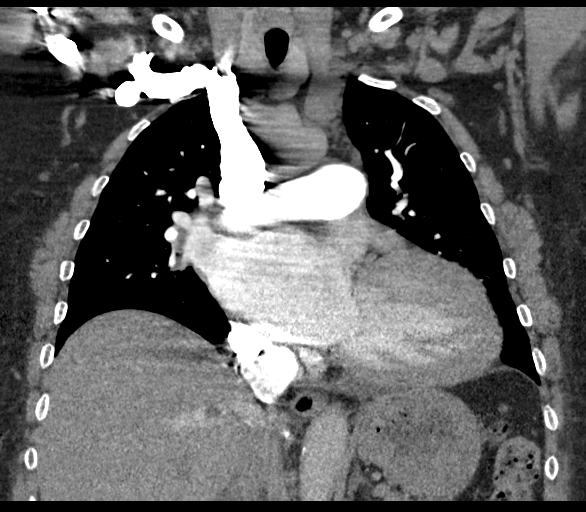
[im 113/151  soft-tissue]
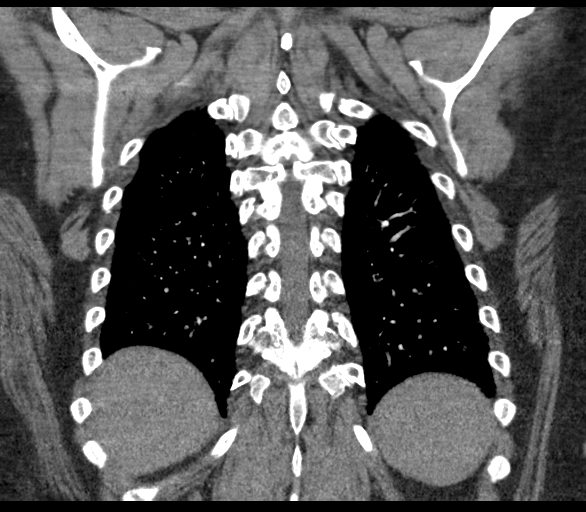

[18 of 46 positions shown; findings below may reference images not displayed]

FINDINGS: Cardiovascular: This is a technically satisfactory study. No
pulmonary emboli are identified.

Moderate to marked cardiomegaly and ascending thoracic aortic
aneurysm measuring 4 cm again noted. No pericardial effusion.

Mediastinum/Nodes: Mildly prominent mediastinal and bilateral hilar
lymph nodes are unchanged from [DATE]. No enlarging mediastinal
lymph nodes. No mediastinal mass. Visualized thyroid and esophagus
are unremarkable.

Lungs/Pleura: Diffuse bilateral ground-glass opacities are again
identified. Some of these areas have a mosaic type pattern.. No
airspace disease, consolidation pleural effusion, pneumothorax or
mass.

Upper Abdomen: No acute abnormality

Musculoskeletal: No acute or suspicious bony abnormalities
identified.

Review of the MIP images confirms the above findings.
IMPRESSION: 1. No evidence of pulmonary emboli.
2. Unchanged diffuse bilateral ground-glass opacities, some in a
mosaic type pattern. This is nonspecific but may represent edema or
small airway disease.
3. Moderate to marked cardiomegaly.
4. Unchanged 4 cm ascending thoracic aortic aneurysm NOS
([GW]-[GW]). Recommend annual imaging followup by CTA or MRA. This
recommendation follows [GW]
ACCF/AHA/AATS/ACR/ASA/SCA/KIN/KIN/KIN/KIN Guidelines for the
Diagnosis and Management of Patients with Thoracic Aortic Disease.
Circulation. [GW]; 121: [id number]. Aortic aneurysm NOS ([GW]-[GW])

## 2018-08-25 MED ORDER — FUROSEMIDE 20 MG PO TABS: 20.0000 mg | ORAL_TABLET | Freq: Every day | ORAL | 0 refills | Status: DC

## 2018-08-25 MED ORDER — CARVEDILOL 3.125 MG PO TABS: 6.2500 mg | ORAL_TABLET | Freq: Two times a day (BID) | ORAL | Status: DC

## 2018-08-25 MED ORDER — METFORMIN HCL 1000 MG PO TABS: 1000.0000 mg | ORAL_TABLET | Freq: Two times a day (BID) | ORAL | 1 refills | Status: DC

## 2018-08-25 MED ORDER — INSULIN GLARGINE 100 UNIT/ML ~~LOC~~ SOLN
16.0000 [IU] | Freq: Every day | SUBCUTANEOUS | Status: DC
  Administered 2018-08-25: 16 [IU] via SUBCUTANEOUS
  Filled 2018-08-25 (×2): qty 0.16

## 2018-08-25 MED ORDER — INSULIN ASPART 100 UNIT/ML ~~LOC~~ SOLN
5.0000 [IU] | Freq: Three times a day (TID) | SUBCUTANEOUS | Status: DC
  Administered 2018-08-25: 5 [IU] via SUBCUTANEOUS

## 2018-08-25 MED ORDER — FUROSEMIDE 20 MG PO TABS: 20.0000 mg | ORAL_TABLET | Freq: Every day | ORAL | Status: DC

## 2018-08-25 MED ORDER — ALBUTEROL SULFATE (2.5 MG/3ML) 0.083% IN NEBU: 2.5000 mg | INHALATION_SOLUTION | Freq: Four times a day (QID) | RESPIRATORY_TRACT | Status: DC | PRN

## 2018-08-25 MED ORDER — IOPAMIDOL (ISOVUE-370) INJECTION 76%
100.0000 mL | Freq: Once | INTRAVENOUS | Status: AC | PRN
  Administered 2018-08-25: 100 mL via INTRAVENOUS

## 2018-08-25 MED ORDER — ALBUTEROL SULFATE HFA 108 (90 BASE) MCG/ACT IN AERS: 1.0000 | INHALATION_SPRAY | Freq: Four times a day (QID) | RESPIRATORY_TRACT | 1 refills | Status: DC | PRN

## 2018-08-25 MED ORDER — ALBUTEROL SULFATE (2.5 MG/3ML) 0.083% IN NEBU
INHALATION_SOLUTION | RESPIRATORY_TRACT | Status: AC
  Administered 2018-08-25: 2.5 mg
  Filled 2018-08-25: qty 3

## 2018-08-25 MED ORDER — CARVEDILOL 6.25 MG PO TABS: 6.2500 mg | ORAL_TABLET | Freq: Two times a day (BID) | ORAL | 0 refills | Status: DC

## 2018-08-25 NOTE — Care Management Note (Signed)
Case Management Note  Patient Details  Name: Robin [name] MRN: [id number] Date of Birth: 03/11/1966  Subjective/Objective:     From home, ind with ADL's.lost insurance at end of year. Was not able to get Rx filled. Had PCP but can not afford to go see now that she is uninsured.                Action/Plan: Pt will make follow up appointment with care connects. MATCH given for Rx being given at DC.   Expected Discharge Date:  08/25/18               Expected Discharge Plan:  Home/Self Care  In-House Referral:  PCP / [facility], Financial Counselor  Discharge planning Services  CM Consult, [place] [facility], [facility]  Post Acute Care Choice:    Choice offered to:     DME Arranged:    DME Agency:     HH Arranged:    HH Agency:     Status of Service:  Completed, signed off  If discussed at [employer], dates discussed:    Additional Comments:  Sherald Barge, RN 08/25/2018, 3:41 PM

## 2018-08-25 NOTE — Progress Notes (Signed)
Inpatient Diabetes Program Recommendations  AACE/ADA: New Consensus Statement on Inpatient Glycemic Control (2015)  Target Ranges:  Prepandial:   less than 140 mg/dL      Peak postprandial:   less than 180 mg/dL (1-2 hours)      Critically ill patients:  140 - 180 mg/dL   Lab Results  Component Value Date   GLUCAP 271 (H) 08/25/2018   HGBA1C 11.3 (H) 08/24/2018    Review of Glycemic Control Results for Robin [name], Robin [name] (MRN [id number]) as of 08/25/2018 08:46  Ref. Range 08/13/2017 12:55 08/24/2018 11:14 08/24/2018 21:22 08/25/2018 07:25  Glucose-Capillary Latest Ref Range: 70 - 99 mg/dL 240 (H) 398 (H) 301 (H) 271 (H)   Diabetes history: DM2 Outpatient Diabetes medications: Novolog 70/30 mix 15 units tid + Metformin 1 gm bid Current orders for Inpatient glycemic control: Lantus 5 units + Novolog moderate correction tid + hs  Inpatient Diabetes Program Recommendations:   Patient currently takes 70/30 insulin mix which is approximately 31 units basal + 13.5 units meal coverage.  -Increase Lantus to 16 units (50% home basal) -Novolog 5 units tid if eats 50%  Thank you, Bethena Roys E. Ikechukwu Cerny, RN, MSN, CDE  Diabetes Coordinator Inpatient Glycemic Control Team Team Pager [phone] (8am-5pm) 08/25/2018 8:49 AM

## 2018-08-25 NOTE — Progress Notes (Addendum)
PROGRESS NOTE  Robin [name] DDU:[id number] DOB: 1966-01-22 DOA: 08/24/2018 PCP: Celene Squibb, MD  Brief History:  53 y.o. female with medical history of hypertension, COPD, diabetes mellitus, GERD presenting with 1 week history of shortness of breath and 1 day of substernal and left-sided chest discomfort.  She describes the chest discomfort as sharp and severe in nature that began on 08/23/2018.  She stated that she was watching TV when it began.  It has been constant since then.  She took 8 Advil on the day prior to admission with only minimal relief.  She states that lying supine makes it worse.  She has not had any nausea, vomiting, diarrhea, abdominal pain, dysuria, hematuria.  He has had some subjective fevers and chills nonproductive cough.  She denies any headache or neck pain.  She states that she has been out of all her medications for approximately 1 month.  She attributes this to a recent change in her insurance.  She states that she quit smoking 1-1/2 years ago after approximately 15-pack-year history. Notably, the patient states that she is ran out of all her medications since November 2019.   In the emergency department, the patient was afebrile hemodynamically stable saturating 95-99% on room air.  BMP, CBC were unremarkable.  Chest x-ray showed increased interstitial markings suggestive of edema.  EKG shows sinus rhythm nonspecific T wave changes.  BNP was 700.  Patient was given furosemide 40 mg IV x1.  BNP 700  Assessment/Plan: Acute Systolic CHF -Daily weights -Echocardiogram--EF 30-35%, mild left ear, HK inferobasal and posterior lateral myocardium -pt appears near euvolemia -Continue Lasix IV>>po lasix -cardiology consult -in light of cocaine use, use BB with alpha blockade activity  Atypical chest discomfort -Reproducible on examination -No improvement with nitroglycerin -EKG shows sinus rhythm nonspecific T wave changes -Cycle troponin--neg x  3 -D-dimer--0.54 -Echocardiogram as above -Urine drug screen--+cocaine -CTA chest  Cocaine abuse -use beta blocker with alpha blockade activity -d/c metoprolol -start carvedilol  Essential hypertension, uncontrolled -pt claims losartan causes epistaxis -Anticipate improvement with diuresis  Diabetes mellitus type 2, uncontrolled with hyperglycemia -Hemoglobin A1c--11.3 -NovoLog sliding scale -Increase Lantus 16 units daily -add novolog 5 units tiw -Holding metformin  COPD -Start duo nebs -No wheezing on exam  GERD -Continue PPI    Disposition Plan:   Home when cleared by cardiology Family Communication:   Family at bedside  Consultants:    Code Status:  FULL / DNR  DVT Prophylaxis:  [place] Lovenox   Procedures: As Listed in Progress Note Above  Antibiotics: None  RN Pressure Injury Documentation:        Subjective: Patient states that she still has some chest discomfort, but it is improved from yesterday.  She states her breathing is near normal now.  She denies any headache, nausea, vomiting, diarrhea, abdominal pain, dysuria, hematuria.  There is no dizziness or syncope.  Objective: Vitals:   08/24/18 2119 08/25/18 0323 08/25/18 0511 08/25/18 0927  BP: (!) 151/89  (!) 153/95   Pulse: 71  68   Resp: 18  19   Temp: 98.1 F (36.7 C)  (!) 97.5 F (36.4 C)   TempSrc: Oral  Oral   SpO2: 97% 98% 98% 98%  Weight:   94.3 kg   Height:        Intake/Output Summary (Last 24 hours) at 08/25/2018 1021 Last data filed at 08/25/2018 0942 Gross per 24 hour  Intake 360 ml  Output 1 ml  Net 359 ml   Weight change: 7.303 kg Exam:   General:  Pt is alert, follows commands appropriately, not in acute distress  HEENT: No icterus, No thrush, No neck mass, [place]/AT  Cardiovascular: RRR, S1/S2, no rubs, no gallops  Respiratory: Bibasilar crackles.  No wheezing.  Good air movement.  Abdomen: Soft/+BS, non tender, non distended, no guarding  Extremities:  No edema, No lymphangitis, No petechiae, No rashes, no synovitis   Data Reviewed: I have personally reviewed following labs and imaging studies Basic Metabolic Panel: Recent Labs  Lab 08/24/18 0531 08/25/18 0611  NA 134* 136  K 3.9 3.5  CL 101 101  CO2 22 26  GLUCOSE 364* 265*  BUN 15 20  CREATININE 0.81 0.89  CALCIUM 9.0 8.9   Liver Function Tests: No results for input(s): AST, ALT, ALKPHOS, BILITOT, PROT, ALBUMIN in the last 168 hours. No results for input(s): LIPASE, AMYLASE in the last 168 hours. No results for input(s): AMMONIA in the last 168 hours. Coagulation Profile: No results for input(s): INR, PROTIME in the last 168 hours. CBC: Recent Labs  Lab 08/24/18 0531  WBC 6.9  HGB 14.3  HCT 45.0  MCV 85.4  PLT 230   Cardiac Enzymes: Recent Labs  Lab 08/24/18 0531 08/24/18 0951 08/24/18 1625  TROPONINI <0.03 <0.03 <0.03   BNP: Invalid input(s): POCBNP CBG: Recent Labs  Lab 08/24/18 1114 08/24/18 2122 08/25/18 0725  GLUCAP 398* 301* 271*   HbA1C: Recent Labs    08/24/18 0951  HGBA1C 11.3*   Urine analysis:    Component Value Date/Time   COLORURINE STRAW (A) 08/24/2018 1304   APPEARANCEUR CLEAR 08/24/2018 1304   LABSPEC 1.028 08/24/2018 1304   PHURINE 6.0 08/24/2018 1304   GLUCOSEU >=500 (A) 08/24/2018 1304   HGBUR NEGATIVE 08/24/2018 1304   BILIRUBINUR NEGATIVE 08/24/2018 1304   KETONESUR NEGATIVE 08/24/2018 1304   PROTEINUR NEGATIVE 08/24/2018 1304   UROBILINOGEN 0.2 03/03/2015 1359   NITRITE NEGATIVE 08/24/2018 1304   LEUKOCYTESUR NEGATIVE 08/24/2018 1304   Sepsis Labs: @LABRCNTIP (procalcitonin:4,lacticidven:4) )No results found for this or any previous visit (from the past 240 hour(s)).   Scheduled Meds: . aspirin EC  81 mg Oral Daily  . carvedilol  6.25 mg Oral BID WC  . enoxaparin (LOVENOX) injection  40 mg Subcutaneous Q24H  . furosemide  20 mg Intravenous BID  . insulin aspart  0-15 Units Subcutaneous TID WC  . insulin  aspart  0-5 Units Subcutaneous QHS  . insulin aspart  5 Units Subcutaneous TID WC  . insulin glargine  16 Units Subcutaneous Daily  . ipratropium-albuterol  3 mL Nebulization BID  . losartan  25 mg Oral Daily  . pantoprazole  40 mg Oral Daily  . sodium chloride flush  3 mL Intravenous Q12H   Continuous Infusions: . sodium chloride      Procedures/Studies: Dg Chest 2 View  Result Date: 08/24/2018 CLINICAL DATA:  Midsternal chest pain with shortness of breath for 1 day EXAM: CHEST - 2 VIEW COMPARISON:  07/07/2018 FINDINGS: Cardiomegaly with diffuse interstitial coarsening and Kerley lines. Negative aortic contours. Vascular fullness of the hila. IMPRESSION: CHF. Electronically Signed   By: Monte Fantasia M.D.   On: 08/24/2018 05:43    Orson Eva, DO  [facility] Pager [phone]  If 7PM-7AM, please contact night-coverage [email or web address] Password [email or web address] 08/25/2018, 10:21 AM   LOS: 1 day

## 2018-08-25 NOTE — Consult Note (Addendum)
Cardiology Consult    Patient ID: Robin [name]; [id number]; [birth year]   Admit date: 08/24/2018 Date of Consult: 08/25/2018  Primary Care Provider: Celene Squibb, MD Primary Cardiologist:New to [facility] - Dr. Harl [name]  Patient Profile    Robin [name] is a 53 y.o. female with past medical history of HTN, COPD, and history of normal cors by cath in 2010 who is being seen today for the evaluation of chest pain and new-cardiomyopathy at the request of Robin [name].   History of Present Illness    Robin [name] presented to [facility] ED during the early morning hours of 08/24/2018 for evaluation of chest discomfort. Initial labs showed WBC 6.9, Hgb 14.3, platelets 230, Na+ 134, K+ 3.9, and creatinine 0.81.  BNP elevated to 700.  D-dimer 0.54. Initial and cyclic troponin values have been negative. UDS positive for opiates and cocaine. CXR showed cardiomegaly with diffuse interstitial coarsening consistent with CHF. EKG showed normal sinus rhythm with first-degree AV block, HR 83, with biatrial enlargement. No acute ST changes when compared to prior tracings.  She was started on IV Lasix 40 mg daily at the time of admission and this is been transitioned to PO Lasix 20 mg daily. Weights have been variable at 191 to 207 lbs and I&O's have not been recorded. An echocardiogram was obtained yesterday and showed a reduced EF of 30 to 35% with diffuse hypokinesis worse in the inferior basal and and posterior lateral walls, mild LVH, and mild aortic stenosis. EF was previously preserved at 50 to 55% by echocardiogram in 2017 with diffuse HK present at that time as well.   In talking with the patient today, she reports having been without her medications for the past several months due to her insurance switching and no longer being accepted by her PCP. She has not followed BP at home but this was elevated to 190/108 upon arrival. She reports having episodes of chest discomfort which were constant for the past 3 to 4  days occurring prior to admission. Had also noticed worsening dyspnea on exertion, orthopnea, and lower extremity edema for the past month. She denies any associated palpitations.  She informs me that she is aware her UDS was positive for cocaine upon admission but says she had not intentionally used since 2010. She does use marijuana regularly and reports that a joint had been prepared by family members and she believes this was laced with cocaine. Used on Friday and her chest pain started the following morning.    Past Medical History:  Diagnosis Date  . Arthritis   . CHF (congestive heart failure) ([place])   . Chronic abdominal pain   . COPD (chronic obstructive pulmonary disease) ([place])   . Diabetes mellitus   . Essential hypertension, benign   . GERD (gastroesophageal reflux disease)   . Gout   . Gout 2016  . Normal coronary arteries    3/10 - following abnormal Myoview  . Ovarian cyst   . Type 2 diabetes mellitus ([place])     Past Surgical History:  Procedure Laterality Date  . ABDOMINAL HYSTERECTOMY  09/10/2011   Procedure: HYSTERECTOMY ABDOMINAL;  Surgeon: Robin Kind, MD;  Location: [facility];  Service: Gynecology;  Laterality: N/A;  Abdominal hysterectomy  . CESAREAN SECTION  [id number], and 1994  . CHOLECYSTECTOMY  1995  . SCAR REVISION  09/10/2011   Procedure: SCAR REVISION;  Surgeon: Robin Kind, MD;  Location: [facility];  Service: Gynecology;  Laterality: N/A;  Wide Excision of old Cicatrix  . TUBAL LIGATION  1994     Home Medications:  Prior to Admission medications   Medication Sig Start Date End Date Taking? Authorizing Provider  acetaminophen (TYLENOL 8 HOUR) 650 MG CR tablet Take 1 tablet (650 mg total) by mouth every 8 (eight) hours. 07/26/18  Yes Robin Biles, MD  albuterol (PROVENTIL HFA;VENTOLIN HFA) 108 (90 Base) MCG/ACT inhaler Inhale 1-2 puffs into the lungs every 6 (six) hours as needed for wheezing or shortness of breath.   Yes [provider]    albuterol (PROVENTIL) (2.5 MG/3ML) 0.083% nebulizer solution Take 3 mLs (2.5 mg total) by nebulization every 6 (six) hours as needed for wheezing or shortness of breath. 01/26/18  Yes Robin Christen, MD  aspirin EC 81 MG tablet Take 1 tablet (81 mg total) by mouth daily. 07/12/16  Yes [name], Nishant, MD  furosemide (LASIX) 20 MG tablet Take 20 mg by mouth as needed for fluid.    Yes [provider]  insulin aspart protamine- aspart (NOVOLOG MIX 70/30) (70-30) 100 UNIT/ML injection Take 15 units 3 times a day with meals 09/19/17  Yes Robin Ferguson, MD  metFORMIN (GLUCOPHAGE) 1000 MG tablet Take 1 tablet (1,000 mg total) by mouth 2 (two) times daily with a meal. 07/12/16  Yes [name], Nishant, MD  metoprolol tartrate (LOPRESSOR) 25 MG tablet Take 25 mg by mouth 2 (two) times daily. 03/16/18  Yes [provider]  omeprazole (PRILOSEC) 20 MG capsule Take 1 capsule (20 mg total) by mouth daily. 07/07/18  Yes Robin [name], Robin Hair, MD    Inpatient Medications: Scheduled Meds: . aspirin EC  81 mg Oral Daily  . carvedilol  6.25 mg Oral BID WC  . enoxaparin (LOVENOX) injection  40 mg Subcutaneous Q24H  . furosemide  20 mg Intravenous BID  . insulin aspart  0-15 Units Subcutaneous TID WC  . insulin aspart  0-5 Units Subcutaneous QHS  . insulin aspart  5 Units Subcutaneous TID WC  . insulin glargine  16 Units Subcutaneous Daily  . ipratropium-albuterol  3 mL Nebulization BID  . losartan  25 mg Oral Daily  . pantoprazole  40 mg Oral Daily  . sodium chloride flush  3 mL Intravenous Q12H   Continuous Infusions: . sodium chloride     PRN Meds: sodium chloride, acetaminophen, albuterol, guaiFENesin-dextromethorphan, ondansetron (ZOFRAN) IV, oxyCODONE-acetaminophen, sodium chloride flush  Allergies:    Allergies  Allergen Reactions  . Bee Venom Shortness Of Breath and Swelling    Bodily Swelling  . Naproxen Other (See Comments)    Effects acid reflux  . Penicillins Nausea Only         Social History:   Social History   Socioeconomic History  . Marital status: Legally Separated    Spouse name: Not on file  . Number of children: Not on file  . Years of education: Not on file  . Highest education level: Not on file  Occupational History  . Not on file  Social Needs  . Financial resource strain: Not on file  . Food insecurity:    Worry: Not on file    Inability: Not on file  . Transportation needs:    Medical: Not on file    Non-medical: Not on file  Tobacco Use  . Smoking status: Current Some Day Smoker    Packs/day: 0.25    Years: 29.00    Pack years: 7.25    Types: Cigarettes  . Smokeless tobacco: Never Used  .  Tobacco comment: 1 cigarette every 3 days  Substance and Sexual Activity  . Alcohol use: No  . Drug use: No  . Sexual activity: Not Currently    Birth control/protection: Surgical  Lifestyle  . Physical activity:    Days per week: Not on file    Minutes per session: Not on file  . Stress: Not on file  Relationships  . Social connections:    Talks on phone: Not on file    Gets together: Not on file    Attends religious service: Not on file    Active member of club or organization: Not on file    Attends meetings of clubs or organizations: Not on file    Relationship status: Not on file  . Intimate partner violence:    Fear of current or ex partner: Not on file    Emotionally abused: Not on file    Physically abused: Not on file    Forced sexual activity: Not on file  Other Topics Concern  . Not on file  Social History Narrative   Unemployed, lives in [place], [place]     Family History:    Family History  Problem Relation Age of Onset  . Cirrhosis Mother   . Diabetes type II Father   . Diabetes type II Sister   . Anesthesia problems Neg Hx   . Hypotension Neg Hx   . Malignant hyperthermia Neg Hx   . Pseudochol deficiency Neg Hx       Review of Systems    General:  No chills, fever, night sweats or weight changes.   Cardiovascular:  No palpitations, paroxysmal nocturnal dyspnea. Positive for chest pain, dyspnea on exertion, edema, and orthopnea.  Dermatological: No rash, lesions/masses Respiratory: No cough, dyspnea Urologic: No hematuria, dysuria Abdominal:   No nausea, vomiting, diarrhea, bright red blood per rectum, melena, or hematemesis Neurologic:  No visual changes, wkns, changes in mental status. All other systems reviewed and are otherwise negative except as noted above.  Physical Exam/Data    Vitals:   08/24/18 2119 08/25/18 0323 08/25/18 0511 08/25/18 0927  BP: (!) 151/89  (!) 153/95   Pulse: 71  68   Resp: 18  19   Temp: 98.1 F (36.7 C)  (!) 97.5 F (36.4 C)   TempSrc: Oral  Oral   SpO2: 97% 98% 98% 98%  Weight:   94.3 kg   Height:        Intake/Output Summary (Last 24 hours) at 08/25/2018 1039 Last data filed at 08/25/2018 0942 Gross per 24 hour  Intake 360 ml  Output 1 ml  Net 359 ml   Filed Weights   08/24/18 0517 08/25/18 0511  Weight: 87 kg 94.3 kg   Body mass index is 33.56 kg/m.   General: Pleasant, African American female appearing in NAD Psych: Normal affect. Neuro: Alert and oriented X 3. Moves all extremities spontaneously. HEENT: Normal  Neck: Supple without bruits or JVD. Lungs:  Resp regular and unlabored, occasional rales at bases bilaterally. Heart: RRR no s3, s4, or murmurs. Abdomen: Soft, non-tender, non-distended, BS + x 4.  Extremities: No clubbing, cyanosis or lower extremity edema. DP/PT/Radials 2+ and equal bilaterally.    Labs/Studies     Relevant CV Studies:  Cardiac Catheterization: 2010 ANGIOGRAPHIC DATA:  1. Ventriculography done in the RAO projection revealed preserved      global systolic function.  2. Aortic root aortography revealed a normal aorta.  3. On plain fluoroscopy, there was some evidence  of calcification over      the surface of the heart, the exact location unclear, as it did not      appear to be attached to  the right coronary artery.  4. The right coronary artery is a small codominant vessel providing a      small posterior descending Rin Gorton.  It is smooth and without      critical narrowing.  5. The left main is free of critical disease.  6. The left anterior descending artery is a large-caliber vessel that      courses to the apex.  The mid and distal LAD appeared to be normal      without significant obstruction.  Likewise, the proximal LAD      appears to be widely patent in multiple views.  There is an      overlapping proximal Elke Holtry which may constitute a small combined      septal diagonal and there is also a small-to-moderate diagonal      which comes off and takes a steep bend proximally and overlaps the      proximal vessel.  Despite multiple views, this is somewhat      difficult to see, but the mid and distal portion of this appears to      be quite smooth.  The proximal bend is difficult to visualize in      almost any view, but on careful analysis high-grade obstruction is      not particularly noted.  7. The circumflex provides a codominant system with several large      marginal branches.  In laying this out carefully, particularly in      the LAO caudal views, high-grade obstruction proximally is not      seen.  The distal vessels are without critical narrowing.   CONCLUSIONS:  1. Preserved left ventricular function.  2. No significant high-grade obstruction noted with 2 proximal      diagonal branches that overlap as noted in the above study.  3. No evidence of aortic dissection.   RECOMMENDATIONS:  We will have the patient follow up with Dr. Verl Blalock for  continued followup.  I spent extensive time with the patient explaining  vascular biology, smoking, diabetes, hypertension and their interaction  and a potential for ACS down the road.  Risk factor reduction will  clearly be necessary, and it will imperative that the patient  understands this.  I have reviewed it in  extensive detail with her.  She  will follow up with Dr. Verl Blalock.  Echocardiogram: 07/2016 Study Conclusions  - Left ventricle: The cavity size was normal. Wall thickness was   increased in a pattern of moderate LVH. Systolic function was   normal. The estimated ejection fraction was in the range of 50%   to 55%. Diffuse hypokinesis. Doppler parameters are consistent   with restrictive physiology, indicative of decreased left   ventricular diastolic compliance and/or increased left atrial   pressure. - Aortic valve: Mildly calcified annulus. Functionally bicuspid;   mildly calcified leaflets. There was moderate stenosis. Mean   gradient (S): 14 mm Hg. Peak gradient (S): 25 mm Hg. VTI ratio of   LVOT to aortic valve: 0.32. Valve area (VTI): 1.01 cm^2. Valve   area (Vmax): 1.12 cm^2. - Mitral valve: Mildly thickened leaflets . There was trivial   regurgitation. - Left atrium: The atrium was moderately to severely dilated. - Tricuspid valve: There was trivial regurgitation. - Pulmonary arteries: PA peak pressure:  24 mm Hg (S). - Pericardium, extracardiac: A trivial pericardial effusion was   identified posterior to the heart.  Impressions:  - Moderate LVH with LVEF 50-55%. Restrictive diastolic filling   pattern. Moderate to severe left atrial enlargement. Mildly   thickened mitral leaflets with trivial mitral regurgitation.   Functionally bicuspid aortic valve with evidence of moderate   aortic stenosis as outlined above. Trivial tricuspid   regurgitation with PASP 24 mmHg. Trivial posterior pericardial   effusion.  Echocardiogram: 08/24/2018 Study Conclusions  - Left ventricle: Diffuse hypokinesis worse in the inferior basal   and posterior lateral walls. The cavity size was mildly dilated.   Wall thickness was increased in a pattern of mild LVH. Systolic   function was moderately to severely reduced. The estimated   ejection fraction was in the range of 30% to 35%.  Doppler   parameters are consistent with both elevated ventricular   end-diastolic filling pressure and elevated left atrial filling   pressure. - Aortic valve: There was mild stenosis. - Left atrium: The atrium was moderately dilated. - Atrial septum: No defect or patent foramen ovale was identified.  Laboratory Data:  Chemistry Recent Labs  Lab 08/24/18 0531 08/25/18 0611  NA 134* 136  K 3.9 3.5  CL 101 101  CO2 22 26  GLUCOSE 364* 265*  BUN 15 20  CREATININE 0.81 0.89  CALCIUM 9.0 8.9  GFRNONAA >60 >60  GFRAA >60 >60  ANIONGAP 11 9    No results for input(s): PROT, ALBUMIN, AST, ALT, ALKPHOS, BILITOT in the last 168 hours. Hematology Recent Labs  Lab 08/24/18 0531  WBC 6.9  RBC 5.27*  HGB 14.3  HCT 45.0  MCV 85.4  MCH 27.1  MCHC 31.8  RDW 13.2  PLT 230   Cardiac Enzymes Recent Labs  Lab 08/24/18 0531 08/24/18 0951 08/24/18 1625  TROPONINI <0.03 <0.03 <0.03   No results for input(s): TROPIPOC in the last 168 hours.  BNP Recent Labs  Lab 08/24/18 0555  BNP 700.0*    DDimer  Recent Labs  Lab 08/24/18 0951  DDIMER 0.54*    Radiology/Studies:  Dg Chest 2 View  Result Date: 08/24/2018 CLINICAL DATA:  Midsternal chest pain with shortness of breath for 1 day EXAM: CHEST - 2 VIEW COMPARISON:  07/07/2018 FINDINGS: Cardiomegaly with diffuse interstitial coarsening and Kerley lines. Negative aortic contours. Vascular fullness of the hila. IMPRESSION: CHF. Electronically Signed   By: Monte Fantasia M.D.   On: 08/24/2018 05:43     Assessment & Plan    1. New Cardiomyopathy/ Acute Systolic CHF Exacerbation - presented with worsening dyspnea on exertion, orthopnea, and edema starting 2 months prior to admission in the setting of not being able to obtain her medications.  - BNP elevated to 700.  D-dimer 0.54 with CTA pending. CXR showed cardiomegaly with diffuse interstitial coarsening consistent with CHF. Repeat echocardiogram shows a reduced EF of 30 to  35% with diffuse hypokinesis worse in the inferior basal and and posterior lateral walls, mild LVH, and mild aortic stenosis. - she reports significant improvement since receiving IV Lasix and has been transitioned to PO Lasix 20mg  daily. In regards to her cardiomyopathy, would ideally like to utilize BB therapy but this is complicated by her recent Cocaine use. Will discuss further with Dr. Harl [name] whether to continue recently started Coreg. Continue Losartan 25mg  daily. Would favor medical therapy for now and obtain a repeat echocardiogram in 3 months following titration of medical therapy. Sodium and fluid  restriction reviewed.   2. Atypical Chest Pain - had been constant since onset, occurring for 3+ days in leading to admission following recent cocaine use. Denies any current symptoms. Initial and cyclic troponin values have been negative. EKG shows normal sinus rhythm with first-degree AV block, HR 83, with biatrial enlargement. No acute ST changes when compared to prior tracings. - would not anticipate further inpatient ischemic testing at this time.   3. Hypertensive Urgency - BP initially elevated to 190/108, improved to 153/95 on most recent check. Had been without her medications for 2-3 months.  - she has been started on Losartan 25mg  daily and Coreg 6.25mg  BID which can be further titrated as an outpatient if needed.   4. COPD - oxygen saturations currently appropriate on RA.  - per admitting team.   5. Substance Use - reports frequent Marijuana use and remote history of Cocaine use in 2010 until recent episode as outlined above. Risks of substance use, especially in the setting of her cardiomyopathy and current medication regimen, were reviewed with the patient.    For questions or updates, please contact [place] Please consult [email or web address] for contact info under Cardiology/STEMI.  Signed, [name], PA-C 08/25/2018, 10:39 AM Pager: [phone]  Patient seen  and discussed with PA Ahmed Prima, I agree with her documentation above. 53 yo female history of HTN, COPD, DM2, medication noncompliance, cocaine abuse admitted with SOB and left sided chest pain.    WBC 6.9 Hgb 14.3 Plt 230 K 3.9 Cr 0.81 BNP 700 HgbA1c 11.3 D-dimer 0.54  Trop neg-->neg-->neg UDS +cocaine, +opiates CXR with pulm edema CT PE pending Echo LVEF 30-35%, mild LVH  EKG SR, biatrial enlargement, poor R wave progression   New diagnosis of systolic HF in setting of cocaine use, severe HTN with medication noncompliance at home. I/Os incomplete this admission, renal function is stable, had been on lasix IV 20mg  yesterday, converted to oral. Started on coreg, losartan. Beta blocker use in the setting of cocaine use is debated and primarily concerns are theoretical, there is no strong clinical evidence of either side of use. Use of nonspecific coreg in this setting is reasonable given the known significant benefit with systolic HF, we have discussed the theroetical risks with the patient. Plan for 3 months of medical therapy and repeat echo, if not improved consider cath at that time. History of poor compliance and no evidence of ACS, would not pursue cath right at this time, if demonstrates medication and appointment compliance reconsider at 3 months if LVEF does not improve   Would be ok for discharge today from cardiac standpoint pending her CT PE results    Carlyle Dolly MD

## 2018-08-25 NOTE — Discharge Summary (Signed)
Physician Discharge Summary  Robin [name] WFU:[id number] DOB: January 18, 1966 DOA: 08/24/2018  PCP: Celene Squibb, MD  Admit date: 08/24/2018 Discharge date: 08/25/2018  Admitted From: Home Disposition:  Home   Recommendations for Outpatient Follow-up:  1. Follow up with PCP in 1-2 weeks 2. Please obtain BMP/CBC in one week    Discharge Condition: Stable CODE STATUS:Home Diet recommendation: Heart Healthy / Carb Modified    Brief/Interim Summary: 53 y.o.femalewith medical history ofhypertension, COPD, diabetes mellitus, GERD presenting with 1 week history of shortness of breath and 1 day of substernal and left-sided chest discomfort. She describes the chest discomfort as sharp and severe in nature that began on 08/23/2018. She stated that she was watching TV when it began. It has been constant since then. She took 8 Advil on the day prior to admission with only minimal relief. She states that lying supine makes it worse. She has not had any nausea, vomiting, diarrhea, abdominal pain, dysuria, hematuria. He has had some subjective fevers and chills nonproductive cough. She denies any headache or neck pain. She states that she has been out of all her medications for approximately 1 month. She attributes this to a recent change in her insurance. She states that she quit smoking 1-1/2 years ago after approximately 15-pack-year history. Notably, the patient states that she is ran out of all her medications since November 2019.   In the emergency department, the patient was afebrile hemodynamically stable saturating 95-99% on room air. BMP, CBC were unremarkable. Chest x-ray showed increased interstitial markings suggestive of edema. EKG shows sinus rhythm nonspecific T wave changes. BNP was 700. Patient was given furosemide 40 mg IV x1. BNP 700  Discharge Diagnoses:  Acute Systolic CHF -Daily weights -Echocardiogram--EF 30-35%, mild left ear, HK inferobasal and posterior  lateral myocardium -pt appears near euvolemia -Continue Lasix IV>>po lasix -cardiology consult--no further work up for now--follow up in office in one month -in light of cocaine use, use BB with alpha blockade activity  Atypical chest discomfort -Reproducible on examination -No improvement with nitroglycerin -EKG shows sinus rhythm nonspecific T wave changes -Cycle troponin--neg x 3 -D-dimer--0.54 -Echocardiogramas above -Urine drug screen--+cocaine -CTA chest--Neg PE  Cocaine abuse -use beta blocker with alpha blockade activity -d/c metoprolol -start carvedilol  Essential hypertension, uncontrolled -pt claims losartan causes epistaxis -Anticipate improvement with diuresis  Diabetes mellitus type 2, uncontrolled with hyperglycemia -Hemoglobin A1c--11.3 -NovoLog sliding scale -Increase Lantus 16 units daily -add novolog 5 units tiw -Holding metformin>>restart after d/c  COPD -Start duo nebs -No wheezing on exam  GERD -Continue PPI   Discharge Instructions    Follow-up Information    [name], PA-C Follow up on 09/18/2018.   Specialties:  Physician Assistant, Cardiology Why:  [place] on 09/18/2018 at 1:30 PM.  Contact information: [address] [place] [address] [phone]            Consultations:  cardiology   Procedures/Studies: Dg Chest 2 View  Result Date: 08/24/2018 CLINICAL DATA:  Midsternal chest pain with shortness of breath for 1 day EXAM: CHEST - 2 VIEW COMPARISON:  07/07/2018 FINDINGS: Cardiomegaly with diffuse interstitial coarsening and Kerley lines. Negative aortic contours. Vascular fullness of the hila. IMPRESSION: CHF. Electronically Signed   By: Monte Fantasia M.D.   On: 08/24/2018 05:43   Ct Angio Chest Pe W Or Wo Contrast  Result Date: 08/25/2018 CLINICAL DATA:  53 year old female with acute shortness of breath and chest pain. Elevated D-dimer. EXAM: CT ANGIOGRAPHY CHEST  WITH CONTRAST  TECHNIQUE: Multidetector CT imaging of the chest was performed using the standard protocol during bolus administration of intravenous contrast. Multiplanar CT image reconstructions and MIPs were obtained to evaluate the vascular anatomy. CONTRAST:  168mL ISOVUE-370 IOPAMIDOL (ISOVUE-370) INJECTION 76% COMPARISON:  08/24/2018 chest radiograph, 09/19/2017 chest CT and prior studies FINDINGS: Cardiovascular: This is a technically satisfactory study. No pulmonary emboli are identified. Moderate to marked cardiomegaly and ascending thoracic aortic aneurysm measuring 4 cm again noted. No pericardial effusion. Mediastinum/Nodes: Mildly prominent mediastinal and bilateral hilar lymph nodes are unchanged from 09/19/2017. No enlarging mediastinal lymph nodes. No mediastinal mass. Visualized thyroid and esophagus are unremarkable. Lungs/Pleura: Diffuse bilateral ground-glass opacities are again identified. Some of these areas have a mosaic type pattern. No airspace disease, consolidation pleural effusion, pneumothorax or mass. Upper Abdomen: No acute abnormality Musculoskeletal: No acute or suspicious bony abnormalities identified. Review of the MIP images confirms the above findings. IMPRESSION: 1. No evidence of pulmonary emboli. 2. Unchanged diffuse bilateral ground-glass opacities, some in a mosaic type pattern. This is nonspecific but may represent edema or small airway disease. 3. Moderate to marked cardiomegaly. 4. Unchanged 4 cm ascending thoracic aortic aneurysm NOS (ICD10-I71.9). Recommend annual imaging followup by CTA or MRA. This recommendation follows 2010 ACCF/AHA/AATS/ACR/ASA/SCA/SCAI/SIR/STS/SVM Guidelines for the Diagnosis and Management of Patients with Thoracic Aortic Disease. Circulation. 2010; 121: R154-M086. Aortic aneurysm NOS (ICD10-I71.9) Electronically Signed   By: Margarette Canada M.D.   On: 08/25/2018 13:37         Discharge Exam: Vitals:   08/25/18 0927 08/25/18 1336  BP:  (!) 156/80    Pulse:  68  Resp:  17  Temp:  98.3 F (36.8 C)  SpO2: 98% 99%   Vitals:   08/25/18 0323 08/25/18 0511 08/25/18 0927 08/25/18 1336  BP:  (!) 153/95  (!) 156/80  Pulse:  68  68  Resp:  19  17  Temp:  (!) 97.5 F (36.4 C)  98.3 F (36.8 C)  TempSrc:  Oral  Oral  SpO2: 98% 98% 98% 99%  Weight:  94.3 kg    Height:        General: Pt is alert, awake, not in acute distress Cardiovascular: RRR, S1/S2 +, no rubs, no gallops Respiratory: diminished BS. Fine bibasilar rales Abdominal: Soft, NT, ND, bowel sounds  Extremities: no edema, no cyanosis   The results of significant diagnostics from this hospitalization (including imaging, microbiology, ancillary and laboratory) are listed below for reference.    Significant Diagnostic Studies: Dg Chest 2 View  Result Date: 08/24/2018 CLINICAL DATA:  Midsternal chest pain with shortness of breath for 1 day EXAM: CHEST - 2 VIEW COMPARISON:  07/07/2018 FINDINGS: Cardiomegaly with diffuse interstitial coarsening and Kerley lines. Negative aortic contours. Vascular fullness of the hila. IMPRESSION: CHF. Electronically Signed   By: Monte Fantasia M.D.   On: 08/24/2018 05:43   Ct Angio Chest Pe W Or Wo Contrast  Result Date: 08/25/2018 CLINICAL DATA:  53 year old female with acute shortness of breath and chest pain. Elevated D-dimer. EXAM: CT ANGIOGRAPHY CHEST WITH CONTRAST TECHNIQUE: Multidetector CT imaging of the chest was performed using the standard protocol during bolus administration of intravenous contrast. Multiplanar CT image reconstructions and MIPs were obtained to evaluate the vascular anatomy. CONTRAST:  13mL ISOVUE-370 IOPAMIDOL (ISOVUE-370) INJECTION 76% COMPARISON:  08/24/2018 chest radiograph, 09/19/2017 chest CT and prior studies FINDINGS: Cardiovascular: This is a technically satisfactory study. No pulmonary emboli are identified. Moderate to marked cardiomegaly and ascending thoracic aortic aneurysm  measuring 4 cm again noted. No  pericardial effusion. Mediastinum/Nodes: Mildly prominent mediastinal and bilateral hilar lymph nodes are unchanged from 09/19/2017. No enlarging mediastinal lymph nodes. No mediastinal mass. Visualized thyroid and esophagus are unremarkable. Lungs/Pleura: Diffuse bilateral ground-glass opacities are again identified. Some of these areas have a mosaic type pattern. No airspace disease, consolidation pleural effusion, pneumothorax or mass. Upper Abdomen: No acute abnormality Musculoskeletal: No acute or suspicious bony abnormalities identified. Review of the MIP images confirms the above findings. IMPRESSION: 1. No evidence of pulmonary emboli. 2. Unchanged diffuse bilateral ground-glass opacities, some in a mosaic type pattern. This is nonspecific but may represent edema or small airway disease. 3. Moderate to marked cardiomegaly. 4. Unchanged 4 cm ascending thoracic aortic aneurysm NOS (ICD10-I71.9). Recommend annual imaging followup by CTA or MRA. This recommendation follows 2010 ACCF/AHA/AATS/ACR/ASA/SCA/SCAI/SIR/STS/SVM Guidelines for the Diagnosis and Management of Patients with Thoracic Aortic Disease. Circulation. 2010; 121: M384-Y659. Aortic aneurysm NOS (ICD10-I71.9) Electronically Signed   By: Margarette Canada M.D.   On: 08/25/2018 13:37     Microbiology: No results found for this or any previous visit (from the past 240 hour(s)).   Labs: Basic Metabolic Panel: Recent Labs  Lab 08/24/18 0531 08/25/18 0611  NA 134* 136  K 3.9 3.5  CL 101 101  CO2 22 26  GLUCOSE 364* 265*  BUN 15 20  CREATININE 0.81 0.89  CALCIUM 9.0 8.9   Liver Function Tests: No results for input(s): AST, ALT, ALKPHOS, BILITOT, PROT, ALBUMIN in the last 168 hours. No results for input(s): LIPASE, AMYLASE in the last 168 hours. No results for input(s): AMMONIA in the last 168 hours. CBC: Recent Labs  Lab 08/24/18 0531  WBC 6.9  HGB 14.3  HCT 45.0  MCV 85.4  PLT 230   Cardiac Enzymes: Recent Labs  Lab  08/24/18 0531 08/24/18 0951 08/24/18 1625  TROPONINI <0.03 <0.03 <0.03   BNP: Invalid input(s): POCBNP CBG: Recent Labs  Lab 08/24/18 1114 08/24/18 2122 08/25/18 0725  GLUCAP 398* 301* 271*    Time coordinating discharge:  36 minutes  Signed:  Orson Eva, DO [facility] Pager: [phone] 08/25/2018, 3:33 PM

## 2018-08-25 NOTE — Progress Notes (Signed)
Pt removed IV by herself. D/C instructions and medication voucher given to pt. Verbalized understanding. Pt to drive herself home.

## 2018-08-31 ENCOUNTER — Encounter: Payer: Self-pay | Admitting: Physician Assistant

## 2018-09-01 ENCOUNTER — Ambulatory Visit (INDEPENDENT_AMBULATORY_CARE_PROVIDER_SITE_OTHER): Payer: PRIVATE HEALTH INSURANCE | Admitting: Physician Assistant

## 2018-09-01 ENCOUNTER — Telehealth: Payer: Self-pay | Admitting: Physician Assistant

## 2018-09-01 ENCOUNTER — Encounter: Payer: Self-pay | Admitting: Physician Assistant

## 2018-09-01 VITALS — BP 120/68 | HR 73 | Temp 97.8°F | Ht 66.0 in | Wt 209.0 lb

## 2018-09-01 DIAGNOSIS — E1143 Type 2 diabetes mellitus with diabetic autonomic (poly)neuropathy: Secondary | ICD-10-CM | POA: Insufficient documentation

## 2018-09-01 DIAGNOSIS — Z23 Encounter for immunization: Secondary | ICD-10-CM

## 2018-09-01 DIAGNOSIS — I5020 Unspecified systolic (congestive) heart failure: Secondary | ICD-10-CM

## 2018-09-01 DIAGNOSIS — F1411 Cocaine abuse, in remission: Secondary | ICD-10-CM | POA: Diagnosis not present

## 2018-09-01 DIAGNOSIS — Z794 Long term (current) use of insulin: Secondary | ICD-10-CM

## 2018-09-01 DIAGNOSIS — Z1211 Encounter for screening for malignant neoplasm of colon: Secondary | ICD-10-CM

## 2018-09-01 DIAGNOSIS — E1140 Type 2 diabetes mellitus with diabetic neuropathy, unspecified: Secondary | ICD-10-CM

## 2018-09-01 DIAGNOSIS — R040 Epistaxis: Secondary | ICD-10-CM

## 2018-09-01 LAB — BASIC METABOLIC PANEL
BUN: 16 mg/dL (ref 6–23)
CO2: 25 mEq/L (ref 19–32)
Calcium: 9.6 mg/dL (ref 8.4–10.5)
Chloride: 104 mEq/L (ref 96–112)
Creatinine, Ser: 0.89 mg/dL (ref 0.40–1.20)
GFR: 80.56 mL/min (ref >60.00)
Glucose, Bld: 90 mg/dL (ref 70–99)
Potassium: 3.8 mEq/L (ref 3.5–5.1)
Sodium: 140 mEq/L (ref 135–145)

## 2018-09-01 MED ORDER — POTASSIUM CHLORIDE CRYS ER 20 MEQ PO TBCR: 20.0000 meq | EXTENDED_RELEASE_TABLET | Freq: Two times a day (BID) | ORAL | 0 refills | Status: DC

## 2018-09-01 MED ORDER — GABAPENTIN 300 MG PO CAPS: 300.0000 mg | ORAL_CAPSULE | Freq: Two times a day (BID) | ORAL | 1 refills | Status: DC

## 2018-09-01 MED ORDER — FUROSEMIDE 20 MG PO TABS: 20.0000 mg | ORAL_TABLET | Freq: Two times a day (BID) | ORAL | 0 refills | Status: DC

## 2018-09-01 NOTE — Progress Notes (Signed)
Robin [name] is a 53 y.o. female here to Establish Care.  I acted as a Education administrator for [employer], PA-C Anselmo Pickler, LPN  History of Present Illness:   Chief Complaint  Patient presents with  . Establish Care  . Hospitalization Follow-up  . Congestive Heart Failure   Acute Concerns: DM -- has been diabetic for at least 20 years, has uncontrolled neuropathy. Takes 20 U insulin in AM and 25 U insulin in PM. She has never seen endocrinology. She says she doesn't have any strips to check her blood sugar. Also, it was noted that when she went to the hospital on 08/25/18 she told the MD that she ran out of all of her medications since Nov 2019. Used to be on gabapentin 300 mg BID. Lab Results  Component Value Date   HGBA1C 11.3 (H) 08/24/2018   CHF -- was diagnosed with CHF for the past 10-15 years. Currently on 20 mg of lasix and was on 40 mg of lasix at one point. She states that she has had about 3-5 lb weight gain since the hospital. Slight edema in lower legs.  Wt Readings from Last 5 Encounters:  09/01/18 209 lb (94.8 kg)  08/25/18 207 lb 14.4 oz (94.3 kg)  07/26/18 192 lb (87.1 kg)  07/07/18 192 lb (87.1 kg)  04/20/18 200 lb (90.7 kg)    Drug abuse -- states that she smoked a blunt prior to hospitalization and it was "laced with cocaine." Also states that if I were to drug test her any other time moving forward it would be negative.   Nose bleeds -- happened for the first time in May, thought it was due to Losartan, and started taking Metoprolol instead, and it resolved. States that she was given Losartan in the hospital and it started again. Bought a humidifier. Was using Flonase but has stopped.  COPD -- uses nebulizer every morning. Does not use any steroid inhalers.  Health Maintenance: Immunizations -- will give Flu vaccine today, UTD Colonoscopy -- over due will order Mammogram -- over due will order PAP -- N/A  Hysterectomy in 2013 Bone Density -- N/A Weight --  Weight: 209 lb (94.8 kg)   Depression screen PHQ 2/9 09/01/2018  Decreased Interest 0  Down, Depressed, Hopeless 0  PHQ - 2 Score 0    No flowsheet data found.   Other providers/specialists: Patient Care Team: Inda Coke, [place] as PCP - General (Physician Assistant)   Past Medical History:  Diagnosis Date  . Arthritis   . CHF (congestive heart failure) ([place])   . Chronic abdominal pain   . COPD (chronic obstructive pulmonary disease) ([place])   . Essential hypertension, benign   . GERD (gastroesophageal reflux disease)   . Gout 2016  . Normal coronary arteries    3/10 - following abnormal Myoview  . Ovarian cyst   . Type 2 diabetes mellitus ([place])      Social History   Socioeconomic History  . Marital status: Legally Separated    Spouse name: Not on file  . Number of children: Not on file  . Years of education: Not on file  . Highest education level: Not on file  Occupational History  . Not on file  Social Needs  . Financial resource strain: Not on file  . Food insecurity:    Worry: Not on file    Inability: Not on file  . Transportation needs:    Medical: Not on file    Non-medical: Not  on file  Tobacco Use  . Smoking status: Current Some Day Smoker    Packs/day: 0.25    Years: 29.00    Pack years: 7.25    Types: Cigarettes  . Smokeless tobacco: Never Used  . Tobacco comment: 1 cigarette every 3 days  Substance and Sexual Activity  . Alcohol use: No  . Drug use: Yes    Types: Cocaine, Marijuana  . Sexual activity: Yes    Birth control/protection: Surgical  Lifestyle  . Physical activity:    Days per week: Not on file    Minutes per session: Not on file  . Stress: Not on file  Relationships  . Social connections:    Talks on phone: Not on file    Gets together: Not on file    Attends religious service: Not on file    Active member of club or organization: Not on file    Attends meetings of clubs or organizations: Not on file    Relationship  status: Not on file  . Intimate partner violence:    Fear of current or ex partner: Not on file    Emotionally abused: Not on file    Physically abused: Not on file    Forced sexual activity: Not on file  Other Topics Concern  . Not on file  Social History Narrative   Part-time at [employer], lives in [place], [place]   3 children    Married    Past Surgical History:  Procedure Laterality Date  . ABDOMINAL HYSTERECTOMY  09/10/2011   Procedure: HYSTERECTOMY ABDOMINAL;  Surgeon: Jonnie Kind, MD;  Location: [facility];  Service: Gynecology;  Laterality: N/A;  Abdominal hysterectomy  . CESAREAN SECTION  [id number], and 1994  . CHOLECYSTECTOMY  1995  . SCAR REVISION  09/10/2011   Procedure: SCAR REVISION;  Surgeon: Jonnie Kind, MD;  Location: [facility];  Service: Gynecology;  Laterality: N/A;  Wide Excision of old Cicatrix  . TUBAL LIGATION  1994    Family History  Problem Relation Age of Onset  . Cirrhosis Mother   . Early death Mother   . Alcohol abuse Mother   . Diabetes type II Father   . Alcohol abuse Father   . Diabetes type II Sister   . Early death Brother   . Alcohol abuse Son   . Anesthesia problems Neg Hx   . Hypotension Neg Hx   . Malignant hyperthermia Neg Hx   . Pseudochol deficiency Neg Hx   . Colon cancer Neg Hx     Allergies  Allergen Reactions  . Bee Venom Shortness Of Breath and Swelling    Bodily Swelling  . Naproxen Other (See Comments)    Effects acid reflux  . Penicillins Nausea Only    Has patient had a PCN reaction causing immediate rash, facial/tongue/throat swelling, SOB or lightheadedness with hypotension: no Has patient had a PCN reaction causing severe rash involving mucus membranes or skin necrosis: no Has patient had a PCN reaction that required hospitalization no Has patient had a PCN reaction occurring within the last 10 years: no If all of the above answers are "NO", then may proceed with Cephalosporin      Current Medications:    Current Outpatient Medications:  .  acetaminophen (TYLENOL 8 HOUR) 650 MG CR tablet, Take 1 tablet (650 mg total) by mouth every 8 (eight) hours., Disp: 30 tablet, Rfl: 0 .  albuterol (PROVENTIL HFA;VENTOLIN HFA) 108 (90 Base) MCG/ACT inhaler, Inhale 1-2 puffs  into the lungs every 6 (six) hours as needed for wheezing or shortness of breath., Disp: 1 Inhaler, Rfl: 1 .  albuterol (PROVENTIL) (2.5 MG/3ML) 0.083% nebulizer solution, Take 3 mLs (2.5 mg total) by nebulization every 6 (six) hours as needed for wheezing or shortness of breath., Disp: 75 mL, Rfl: 2 .  aspirin EC 81 MG tablet, Take 1 tablet (81 mg total) by mouth daily., Disp: 30 tablet, Rfl: 0 .  carvedilol (COREG) 6.25 MG tablet, Take 1 tablet (6.25 mg total) by mouth 2 (two) times daily with a meal., Disp: 60 tablet, Rfl: 0 .  furosemide (LASIX) 20 MG tablet, Take 1 tablet (20 mg total) by mouth 2 (two) times daily., Disp: 30 tablet, Rfl: 0 .  insulin aspart protamine- aspart (NOVOLOG MIX 70/30) (70-30) 100 UNIT/ML injection, Take 15 units 3 times a day with meals, Disp: 10 mL, Rfl: 11 .  metFORMIN (GLUCOPHAGE) 1000 MG tablet, Take 1 tablet (1,000 mg total) by mouth 2 (two) times daily with a meal., Disp: 60 tablet, Rfl: 1 .  omeprazole (PRILOSEC) 40 MG capsule, Take 40 mg by mouth 2 (two) times daily., Disp: , Rfl:  .  gabapentin (NEURONTIN) 300 MG capsule, Take 1 capsule (300 mg total) by mouth 2 (two) times daily., Disp: 60 capsule, Rfl: 1   Review of Systems:   Review of Systems  Constitutional: Negative for chills, fever, malaise/fatigue and weight loss.  Respiratory: Positive for shortness of breath.   Cardiovascular: Positive for leg swelling. Negative for chest pain, orthopnea and claudication.  Gastrointestinal: Negative for heartburn, nausea and vomiting.  Musculoskeletal: Positive for joint pain (bilateral leg pain). Negative for back pain.  Neurological: Negative for dizziness, tingling and headaches.   Psychiatric/Behavioral: Positive for substance abuse. Negative for hallucinations. The patient is not nervous/anxious.       Vitals:   Vitals:   09/01/18 0802  BP: 120/68  Pulse: 73  Temp: 97.8 F (36.6 C)  TempSrc: Oral  SpO2: 97%  Weight: 209 lb (94.8 kg)  Height: 5\' 6"  (1.676 m)      Body mass index is 33.73 kg/m.  Physical Exam:   Physical Exam Vitals signs and nursing note reviewed.  Constitutional:      General: She is not in acute distress.    Appearance: She is well-developed. She is not ill-appearing or toxic-appearing.  HENT:     Nose: No mucosal edema, congestion or rhinorrhea.     Comments: Slight bloody discharge present in L nare Cardiovascular:     Rate and Rhythm: Normal rate and regular rhythm.     Pulses: Normal pulses.     Heart sounds: Normal heart sounds, S1 normal and S2 normal.     Comments: Bilateral LE edema Pulmonary:     Effort: Pulmonary effort is normal.     Breath sounds: Normal breath sounds.  Skin:    General: Skin is warm and dry.  Neurological:     Mental Status: She is alert.     GCS: GCS eye subscore is 4. GCS verbal subscore is 5. GCS motor subscore is 6.  Psychiatric:        Speech: Speech normal.        Behavior: Behavior normal. Behavior is cooperative.     Assessment and Plan:   Tevis was seen today for establish care, hospitalization follow-up and congestive heart failure.  Diagnoses and all orders for this visit:  Systolic congestive heart failure, unspecified HF chronicity ([place]) Slightly edematous on exam.  She states that she has had 3-5 lb weight gain since hospital d/c. Will trial 20 mg lasix BID. Start 20 mEq KCl BID. Follow-up in 1.5 weeks. Has cardiology appt 2/14. -     Basic metabolic panel  Need for prophylactic vaccination and inoculation against influenza -     Flu Vaccine QUAD 36+ mos IM  Hx of cocaine abuse ([place]) Discussed with patient that I am concerned with this. She states that "we can test  my urine anytime and it will be clean." UDS today.  -     Pain Mgmt, Profile 8 w/Conf, U  Type 2 diabetes mellitus with diabetic neuropathy, with long-term current use of insulin ([place]) Very uncontrolled. Provided patient with meters today. Referral to endocrinology. Resume gabapentin 300 mg BID.  Special screening for malignant neoplasms, colon -     Ambulatory referral to Gastroenterology  Epistaxis No red flags on exam. Stop Flonase. Continue humidifier. Did recommend using Afrin if she has another acute episode, apply to fox swab and place in nose. If symptoms persist, will refer to ENT.  Other orders -     furosemide (LASIX) 20 MG tablet; Take 1 tablet (20 mg total) by mouth 2 (two) times daily. -     gabapentin (NEURONTIN) 300 MG capsule; Take 1 capsule (300 mg total) by mouth 2 (two) times daily.    . Reviewed expectations re: course of current medical issues. . Discussed self-management of symptoms. . Outlined signs and symptoms indicating need for more acute intervention. . Patient verbalized understanding and all questions were answered. . See orders for this visit as documented in the electronic medical record. . Patient received an After-Visit Summary.  CMA or LPN served as scribe during this visit. History, Physical, and Plan performed by medical provider. The above documentation has been reviewed and is accurate and complete.  Inda Coke, PA-C

## 2018-09-01 NOTE — Patient Instructions (Addendum)
It was great to see you!  If you develop another nasal bleed, use Afrin nasal spray (pick up over the counter at pharmacy) and place on swab -- put in nose.     Will refer to endocrinology for help with your diabetes.  May use gabapentin 300 mg twice daily.  Increase Lasix to 40 mg daily.  Let's follow-up in 2 weeks, sooner if you have concerns.  Take care,  Inda Coke PA-C

## 2018-09-01 NOTE — Telephone Encounter (Signed)
Patient called for lab results, unable to give results to patient, no CRM for [facility] to disclose results.

## 2018-09-01 NOTE — Telephone Encounter (Signed)
See note

## 2018-09-02 ENCOUNTER — Encounter: Payer: Self-pay | Admitting: Internal Medicine

## 2018-09-02 LAB — PAIN MGMT, PROFILE 8 W/CONF, U
6 Acetylmorphine: NEGATIVE ng/mL (ref <10)
Alcohol Metabolites: NEGATIVE ng/mL (ref <500)
Amphetamines: NEGATIVE ng/mL (ref <500)
Benzodiazepines: NEGATIVE ng/mL (ref <100)
Buprenorphine, Urine: NEGATIVE ng/mL (ref <5)
Cocaine Metabolite: NEGATIVE ng/mL (ref <150)
Creatinine: 22.9 mg/dL
MDMA: NEGATIVE ng/mL (ref <500)
Marijuana Metabolite: NEGATIVE ng/mL (ref <20)
Opiates: NEGATIVE ng/mL (ref <100)
Oxidant: NEGATIVE ug/mL (ref <200)
Oxycodone: NEGATIVE ng/mL (ref <100)
pH: 5.16 (ref 4.5–9.0)

## 2018-09-02 NOTE — Telephone Encounter (Signed)
Pt aware, see result note. 

## 2018-09-04 ENCOUNTER — Telehealth: Payer: Self-pay | Admitting: Physician Assistant

## 2018-09-04 NOTE — Telephone Encounter (Signed)
Please call patient and clarify the question. She was on 20 mg lasix daily when I saw her. We discussed increasing to 20 mg BID.

## 2018-09-04 NOTE — Telephone Encounter (Signed)
Copied from [place] [id number]. Topic: Quick Communication - Rx Refill/Question >> Sep 04, 2018  9:15 AM Margot Ables wrote: Medication: furosemide (LASIX) 20 MG tablet - pt states that she thought RX was being increase to 40mg  - pt notes that she has been taking 20mg  BID previously and RX sent 1/28 is the same. Please advise.   Preferred Pharmacy (with phone number or street name): [place], [place] - [id number] [phone] (Phone) [phone] (Fax)

## 2018-09-04 NOTE — Telephone Encounter (Signed)
Please see message and advise 

## 2018-09-04 NOTE — Telephone Encounter (Signed)
Spoke to pt told her Lasix was increased to 20 mg twice a day you were taking only daily. Pt verbalized understanding. Also you were given enough until appt with Cardiology and then they will take it over. Pt verbalized understanding.

## 2018-09-04 NOTE — Telephone Encounter (Signed)
See note

## 2018-09-08 LAB — GLUCOSE, CAPILLARY
Glucose-Capillary: 313 mg/dL — ABNORMAL HIGH (ref 70–99)
Glucose-Capillary: 412 mg/dL — ABNORMAL HIGH (ref 70–99)

## 2018-09-11 ENCOUNTER — Encounter: Payer: Self-pay | Admitting: Physician Assistant

## 2018-09-16 ENCOUNTER — Ambulatory Visit: Payer: PRIVATE HEALTH INSURANCE | Admitting: Physician Assistant

## 2018-09-18 ENCOUNTER — Ambulatory Visit: Payer: PRIVATE HEALTH INSURANCE | Admitting: Student

## 2018-09-18 ENCOUNTER — Encounter: Payer: Self-pay | Admitting: Student

## 2018-09-18 VITALS — BP 136/86 | HR 76 | Ht 66.0 in | Wt 211.0 lb

## 2018-09-18 DIAGNOSIS — Z79899 Other long term (current) drug therapy: Secondary | ICD-10-CM

## 2018-09-18 DIAGNOSIS — I712 Thoracic aortic aneurysm, without rupture, unspecified: Secondary | ICD-10-CM

## 2018-09-18 DIAGNOSIS — F1910 Other psychoactive substance abuse, uncomplicated: Secondary | ICD-10-CM

## 2018-09-18 DIAGNOSIS — I5021 Acute systolic (congestive) heart failure: Secondary | ICD-10-CM

## 2018-09-18 DIAGNOSIS — I10 Essential (primary) hypertension: Secondary | ICD-10-CM

## 2018-09-18 MED ORDER — FUROSEMIDE 20 MG PO TABS: 20.0000 mg | ORAL_TABLET | Freq: Two times a day (BID) | ORAL | 3 refills | Status: DC

## 2018-09-18 MED ORDER — CARVEDILOL 6.25 MG PO TABS: 6.2500 mg | ORAL_TABLET | Freq: Two times a day (BID) | ORAL | 3 refills | Status: DC

## 2018-09-18 MED ORDER — POTASSIUM CHLORIDE CRYS ER 20 MEQ PO TBCR: 20.0000 meq | EXTENDED_RELEASE_TABLET | Freq: Two times a day (BID) | ORAL | 3 refills | Status: DC

## 2018-09-18 MED ORDER — LISINOPRIL 5 MG PO TABS: 5.0000 mg | ORAL_TABLET | Freq: Every day | ORAL | 3 refills | Status: DC

## 2018-09-18 NOTE — Progress Notes (Signed)
Cardiology Office Note    Date:  09/18/2018   ID:  Robin [name], DOB [birth year], MRN [id number]  PCP:  Inda Coke, [place]  Cardiologist: Carlyle Dolly, MD    Chief Complaint  Patient presents with  . Hospitalization Follow-up    History of Present Illness:    Robin [name] is a 53 y.o. female with past medical history of HTN, COPD, thoracic aortic aneurysm, and history of normal cors by cath in 2010 who presents to the office today for hospital follow-up.  She was recently admitted to [facility] in 08/2018 for evaluation of worsening dyspnea and chest pain.  Initial and cyclic troponin values remained negative and BNP was found to be elevated to 700. UDS was also positive for opiates and cocaine. EKG showed no acute ST changes. She was started on IV Lasix at the time of admission and an echocardiogram was obtained for further evaluation which showed a reduced EF of 30 to 35% with diffuse hypokinesis, most prominent in the inferior basal and posterior lateral walls.  Was also found to have mild aortic stenosis. Her pain was overall thought to be atypical as it had been constant for 3 to 4 days leading up to admission. She was started on Coreg 6.25mg  BID along with Lasix 20 mg daily at the time of discharge. Was also on Losartan during admission but it does not appear this was continued on her AVS. Was recommended to continue with medical therapy for 3 months and repeat an echocardiogram at that time with consideration of a cardiac catheterization if EF did not improve.  In talking with the patient today, she reports overall doing well since her recent hospitalization. She denies any recent dyspnea on exertion, chest pain, orthopnea, or PND. Says she was experiencing intermittent lower extremity edema but this resolved when her PCP further titrated her Lasix to 20 mg twice daily. Says that weight has overall been stable on her home scales over the past few weeks (207 - 208 lbs). Says she  has reduced her sodium intake significantly and is no longer consuming pork.  She reports good compliance with her current medication regimen. Was not placed on Losartan at the time of hospital discharge due to having epistaxis (not a typo) due to the medication. Says she had experienced this in the past as well when initiated by her PCP.     Past Medical History:  Diagnosis Date  . Arthritis   . CHF (congestive heart failure) ([place])   . Chronic abdominal pain   . COPD (chronic obstructive pulmonary disease) ([place])   . Essential hypertension, benign   . GERD (gastroesophageal reflux disease)   . Gout 2016  . Normal coronary arteries    3/10 - following abnormal Myoview  . Ovarian cyst   . Type 2 diabetes mellitus ([place])     Past Surgical History:  Procedure Laterality Date  . ABDOMINAL HYSTERECTOMY  09/10/2011   Procedure: HYSTERECTOMY ABDOMINAL;  Surgeon: Jonnie Kind, MD;  Location: [facility];  Service: Gynecology;  Laterality: N/A;  Abdominal hysterectomy  . CESAREAN SECTION  [id number], and 1994  . CHOLECYSTECTOMY  1995  . SCAR REVISION  09/10/2011   Procedure: SCAR REVISION;  Surgeon: Jonnie Kind, MD;  Location: [facility];  Service: Gynecology;  Laterality: N/A;  Wide Excision of old Cicatrix  . TUBAL LIGATION  1994    Current Medications: Outpatient Medications Prior to Visit  Medication Sig Dispense Refill  . acetaminophen (TYLENOL 8  HOUR) 650 MG CR tablet Take 1 tablet (650 mg total) by mouth every 8 (eight) hours. 30 tablet 0  . albuterol (PROVENTIL HFA;VENTOLIN HFA) 108 (90 Base) MCG/ACT inhaler Inhale 1-2 puffs into the lungs every 6 (six) hours as needed for wheezing or shortness of breath. 1 Inhaler 1  . albuterol (PROVENTIL) (2.5 MG/3ML) 0.083% nebulizer solution Take 3 mLs (2.5 mg total) by nebulization every 6 (six) hours as needed for wheezing or shortness of breath. 75 mL 2  . aspirin EC 81 MG tablet Take 1 tablet (81 mg total) by mouth daily. 30 tablet 0  .  gabapentin (NEURONTIN) 300 MG capsule Take 1 capsule (300 mg total) by mouth 2 (two) times daily. 60 capsule 1  . insulin aspart protamine- aspart (NOVOLOG MIX 70/30) (70-30) 100 UNIT/ML injection Take 15 units 3 times a day with meals 10 mL 11  . metFORMIN (GLUCOPHAGE) 1000 MG tablet Take 1 tablet (1,000 mg total) by mouth 2 (two) times daily with a meal. 60 tablet 1  . omeprazole (PRILOSEC) 40 MG capsule Take 40 mg by mouth 2 (two) times daily.    . carvedilol (COREG) 6.25 MG tablet Take 1 tablet (6.25 mg total) by mouth 2 (two) times daily with a meal. 60 tablet 0  . furosemide (LASIX) 20 MG tablet Take 1 tablet (20 mg total) by mouth 2 (two) times daily. 30 tablet 0  . potassium chloride SA (K-DUR,KLOR-CON) 20 MEQ tablet Take 1 tablet (20 mEq total) by mouth 2 (two) times daily. 30 tablet 0   No facility-administered medications prior to visit.      Allergies:   Bee venom; Naproxen; and Penicillins   Social History   Socioeconomic History  . Marital status: Legally Separated    Spouse name: Not on file  . Number of children: Not on file  . Years of education: Not on file  . Highest education level: Not on file  Occupational History  . Not on file  Social Needs  . Financial resource strain: Not on file  . Food insecurity:    Worry: Not on file    Inability: Not on file  . Transportation needs:    Medical: Not on file    Non-medical: Not on file  Tobacco Use  . Smoking status: Current Some Day Smoker    Packs/day: 0.25    Years: 29.00    Pack years: 7.25    Types: Cigarettes  . Smokeless tobacco: Never Used  . Tobacco comment: 1 cigarette every 3 days  Substance and Sexual Activity  . Alcohol use: No  . Drug use: Yes    Types: Cocaine, Marijuana  . Sexual activity: Yes    Birth control/protection: Surgical  Lifestyle  . Physical activity:    Days per week: Not on file    Minutes per session: Not on file  . Stress: Not on file  Relationships  . Social connections:      Talks on phone: Not on file    Gets together: Not on file    Attends religious service: Not on file    Active member of club or organization: Not on file    Attends meetings of clubs or organizations: Not on file    Relationship status: Not on file  Other Topics Concern  . Not on file  Social History Narrative   Part-time at [employer], lives in [place], [place]   3 children    Married     Family History:  The patient's family history includes Alcohol abuse in her father, mother, and son; Cirrhosis in her mother; Diabetes type II in her father and sister; Early death in her brother and mother.   Review of Systems:   Please see the history of present illness.     General:  No chills, fever, night sweats or weight changes.  Cardiovascular:  No chest pain, dyspnea on exertion, orthopnea, palpitations, paroxysmal nocturnal dyspnea. Positive for edema (now improved).  Dermatological: No rash, lesions/masses Respiratory: No cough, dyspnea Urologic: No hematuria, dysuria Abdominal:   No nausea, vomiting, diarrhea, bright red blood per rectum, melena, or hematemesis Neurologic:  No visual changes, wkns, changes in mental status.  All other systems reviewed and are otherwise negative except as noted above.   Physical Exam:    VS:  BP 136/86   Pulse 76   Ht 5\' 6"  (1.676 m)   Wt 211 lb (95.7 kg)   LMP 08/21/2011   SpO2 96%   BMI 34.06 kg/m    General: Well developed, African American female appearing in no acute distress. Head: Normocephalic, atraumatic, sclera non-icteric, no xanthomas, nares are without discharge.  Neck: No carotid bruits. JVD not elevated.  Lungs: Respirations regular and unlabored, without wheezes or rales.  Heart: Regular rate and rhythm. No S3 or S4.  No murmur, no rubs, or gallops appreciated. Abdomen: Soft, non-tender, non-distended with normoactive bowel sounds. No hepatomegaly. No rebound/guarding. No obvious abdominal masses. Msk:  Strength and tone  appear normal for age. No joint deformities or effusions. Extremities: No clubbing or cyanosis. No lower extremity edema.  Distal pedal pulses are 2+ bilaterally. Neuro: Alert and oriented X 3. Moves all extremities spontaneously. No focal deficits noted. Psych:  Responds to questions appropriately with a normal affect. Skin: No rashes or lesions noted  Wt Readings from Last 3 Encounters:  09/18/18 211 lb (95.7 kg)  09/01/18 209 lb (94.8 kg)  08/25/18 207 lb 14.4 oz (94.3 kg)     Studies/Labs Reviewed:   EKG:  EKG is not ordered today.   Recent Labs: 07/07/2018: ALT 18 08/24/2018: B Natriuretic Peptide 700.0; Hemoglobin 14.3; Platelets 230 09/01/2018: BUN 16; Creatinine, Ser 0.89; Potassium 3.8; Sodium 140   Lipid Panel    Component Value Date/Time   CHOL 124 06/09/2011 0646   TRIG 185 (H) 06/09/2011 0646   HDL 47 06/09/2011 0646   CHOLHDL 2.6 06/09/2011 0646   VLDL 37 06/09/2011 0646   LDLCALC 40 06/09/2011 0646    Additional studies/ records that were reviewed today include:   Echocardiogram: 08/24/2018 Study Conclusions  - Left ventricle: Diffuse hypokinesis worse in the inferior basal   and posterior lateral walls. The cavity size was mildly dilated.   Wall thickness was increased in a pattern of mild LVH. Systolic   function was moderately to severely reduced. The estimated   ejection fraction was in the range of 30% to 35%. Doppler   parameters are consistent with both elevated ventricular   end-diastolic filling pressure and elevated left atrial filling   pressure. - Aortic valve: There was mild stenosis. - Left atrium: The atrium was moderately dilated. - Atrial septum: No defect or patent foramen ovale was identified.  Assessment:    1. Acute systolic CHF (congestive heart failure) ([place])   2. Essential hypertension   3. Thoracic aortic aneurysm without rupture ([place])   4. Substance abuse ([place])   5. Medication management      Plan:   In order of  problems  listed above:  1. Acute Systolic CHF - recent echocardiogram showed her EF was reduced to 30 to 35% with diffuse hypokinesis as outlined above. Medical management initially recommended before pursuing repeat ischemic evaluation.  - She denies any recent chest pain or dyspnea on exertion. Appears euvolemic by examination today. She is currently on Coreg 6.25 mg twice daily and Lasix 20 mg twice daily. Was intolerant to Losartan as outlined above but is open to trying additional medical therapy but does not to be on anything similar to Losartan as she reports this caused epistaxis in the past. Was on Lisinopril several years ago without reported side effects and will start at 5mg  daily for now. Arrange for BMET in 2 weeks and close follow-up in 3-4 weeks for further titration of her medical therapy if BP allows. Sodium and fluid restriction reviewed. Would plan for a repeat echocardiogram in 3 months with ischemic evaluation at that time if EF remains reduced.   2. HTN - BP much improved to 136/86 during today's visit as compared to values during her hospitalization. Will continue Coreg 6.25 mg twice daily and start Lisinopril 5 mg daily as outlined above in the setting of her cardiomyopathy.  3. Thoracic Aortic Aneurysm - at 4 cm by CTA in 08/2018. Continue to follow with annual imaging.   4. Substance Use - UDS during admission was positive for Cocaine. Denies any recurrent use and repeat UDS obtained by her PCP was negative. Remains on BB therapy for her cardiomyopathy.     Medication Adjustments/Labs and Tests Ordered: Current medicines are reviewed at length with the patient today.  Concerns regarding medicines are outlined above.  Medication changes, Labs and Tests ordered today are listed in the Patient Instructions below. Patient Instructions  Medication Instructions:  Your physician has recommended you make the following change in your medication:  Start Taking Lisinopril 5 mg  Daily   If you need a refill on your cardiac medications before your next appointment, please call your pharmacy.   Lab work: Your physician recommends that you return for lab work in: 2 Weeks ( 10/02/2018)   If you have labs (blood work) drawn today and your tests are completely normal, you will receive your results only by: Marland Kitchen MyChart Message (if you have MyChart) OR . A paper copy in the mail If you have any lab test that is abnormal or we need to change your treatment, we will call you to review the results.  Testing/Procedures: NONE   Follow-Up: At [facility], you and your health needs are our priority.  As part of our continuing mission to provide you with exceptional heart care, we have created designated Provider Care Teams.  These Care Teams include your primary Cardiologist (physician) and Advanced Practice Providers (APPs -  Physician Assistants and Nurse Practitioners) who all work together to provide you with the care you need, when you need it. You will need a follow up appointment in 3-4 weeks.  Please call our office 2 months in advance to schedule this appointment.  You may see Carlyle Dolly, MD or one of the following Advanced Practice Providers on your designated Care Team:   Bernerd Pho, PA-C [facility]) . Ermalinda Barrios, PA-C ([place])  Any Other Special Instructions Will Be Listed Below (If Applicable). Thank you for choosing [facility]!    Signed, [name], PA-C  09/18/2018 4:49 PM    [place] [address] [place], [place] [address]  Phone: [phone] Fax: [phone]

## 2018-09-18 NOTE — Patient Instructions (Signed)
Medication Instructions:  Your physician has recommended you make the following change in your medication:  Start Taking Lisinopril 5 mg Daily   If you need a refill on your cardiac medications before your next appointment, please call your pharmacy.   Lab work: Your physician recommends that you return for lab work in: 2 Weeks ( 10/02/2018)   If you have labs (blood work) drawn today and your tests are completely normal, you will receive your results only by: Marland Kitchen MyChart Message (if you have MyChart) OR . A paper copy in the mail If you have any lab test that is abnormal or we need to change your treatment, we will call you to review the results.  Testing/Procedures: NONE   Follow-Up: At [facility], you and your health needs are our priority.  As part of our continuing mission to provide you with exceptional heart care, we have created designated Provider Care Teams.  These Care Teams include your primary Cardiologist (physician) and Advanced Practice Providers (APPs -  Physician Assistants and Nurse Practitioners) who all work together to provide you with the care you need, when you need it. You will need a follow up appointment in 3-4 weeks.  Please call our office 2 months in advance to schedule this appointment.  You may see Carlyle Dolly, MD or one of the following Advanced Practice Providers on your designated Care Team:   Bernerd Pho, PA-C [facility]) . Ermalinda Barrios, PA-C ([place])  Any Other Special Instructions Will Be Listed Below (If Applicable). Thank you for choosing [facility]!

## 2018-09-21 ENCOUNTER — Other Ambulatory Visit: Payer: Self-pay | Admitting: Physician Assistant

## 2018-09-21 ENCOUNTER — Ambulatory Visit (INDEPENDENT_AMBULATORY_CARE_PROVIDER_SITE_OTHER): Payer: PRIVATE HEALTH INSURANCE | Admitting: Physician Assistant

## 2018-09-21 ENCOUNTER — Encounter: Payer: Self-pay | Admitting: Physician Assistant

## 2018-09-21 VITALS — BP 138/80 | HR 88 | Temp 97.7°F | Ht 66.0 in | Wt 211.5 lb

## 2018-09-21 DIAGNOSIS — I10 Essential (primary) hypertension: Secondary | ICD-10-CM

## 2018-09-21 DIAGNOSIS — Z794 Long term (current) use of insulin: Secondary | ICD-10-CM

## 2018-09-21 DIAGNOSIS — R1012 Left upper quadrant pain: Secondary | ICD-10-CM

## 2018-09-21 DIAGNOSIS — E1140 Type 2 diabetes mellitus with diabetic neuropathy, unspecified: Secondary | ICD-10-CM

## 2018-09-21 DIAGNOSIS — R109 Unspecified abdominal pain: Secondary | ICD-10-CM

## 2018-09-21 DIAGNOSIS — R300 Dysuria: Secondary | ICD-10-CM

## 2018-09-21 LAB — BASIC METABOLIC PANEL
BUN: 21 mg/dL (ref 6–23)
CO2: 25 mEq/L (ref 19–32)
Calcium: 9.4 mg/dL (ref 8.4–10.5)
Chloride: 100 mEq/L (ref 96–112)
Creatinine, Ser: 0.81 mg/dL (ref 0.40–1.20)
GFR: 89.79 mL/min (ref >60.00)
Glucose, Bld: 297 mg/dL — ABNORMAL HIGH (ref 70–99)
Potassium: 4.4 mEq/L (ref 3.5–5.1)
Sodium: 135 mEq/L (ref 135–145)

## 2018-09-21 LAB — URINALYSIS, ROUTINE W REFLEX MICROSCOPIC
Bilirubin Urine: NEGATIVE
Hgb urine dipstick: NEGATIVE
Ketones, ur: NEGATIVE
Leukocytes,Ua: NEGATIVE
Nitrite: NEGATIVE
Specific Gravity, Urine: 1.015 (ref 1.000–1.030)
Total Protein, Urine: NEGATIVE
Urine Glucose: >=1000 — AB
Urobilinogen, UA: 0.2 (ref 0.0–1.0)
pH: 5.5 (ref 5.0–8.0)

## 2018-09-21 MED ORDER — METFORMIN HCL 1000 MG PO TABS: 1000.0000 mg | ORAL_TABLET | Freq: Two times a day (BID) | ORAL | 1 refills | Status: DC

## 2018-09-21 MED ORDER — INSULIN ASPART PROT & ASPART (70-30 MIX) 100 UNIT/ML ~~LOC~~ SUSP: SUBCUTANEOUS | 11 refills | Status: DC

## 2018-09-21 MED ORDER — GABAPENTIN 300 MG PO CAPS: 300.0000 mg | ORAL_CAPSULE | Freq: Three times a day (TID) | ORAL | 2 refills | Status: DC

## 2018-09-21 NOTE — Patient Instructions (Signed)
It was great to see you!  You will be contacted about your CT scan and referral to endocrinology.  I will be in touch with your lab and urine results.  You may increase gabapentin to 300 mg three times a day.  Let's follow-up in 2 weeks for your urine pain, sooner if you have concerns.  Take care,  Inda Coke PA-C    Thoracic Aortic Aneurysm  An aneurysm is a bulge in an artery. It happens when blood pushes up against a weakened or damaged artery wall. A thoracic aortic aneurysm is an aneurysm that occurs in the first part of the aorta, between the heart and the diaphragm. The aorta is the main artery of the body. It supplies blood from the heart to the rest of the body. Some aneurysms may not cause symptoms or problems. However, the major concern with a thoracic aortic aneurysm is that it can enlarge and burst (rupture), or blood can flow between the layers of the wall of the aorta through a tear (aorticdissection). Both of these conditions can cause bleeding inside the body and can be life threatening if they are not diagnosed and treated right away. What are the causes? The exact cause of this condition is not known. What increases the risk? The following factors may make you more likely to develop this condition:  Being 19 or older.  Having hardening of the arteries caused by the buildup of fat and other substances in the lining of a blood vessel (arteriosclerosis).  Having inflammation of the walls of an artery (arteritis).  Having a genetic disease that weakens the body's connective tissue, such as Marfan syndrome.  Having a family history of aneurysms.  Having an injury or trauma to the aorta.  Using tobacco.  Having high blood pressure (hypertension).  Having high cholesterol.  Having an infection that is caused by bacteria, such as syphilis or staphylococcus, in the wall of the aorta (infectious aortitis). What are the signs or symptoms? Symptoms of this  condition vary depending on the size of the aneurysm and how fast it is growing. Most grow slowly and do not cause symptoms. When symptoms do occur, they may include:  Pain in the chest, back, sides, or abdomen. The pain may vary in intensity. Sudden, severe pain may indicate that the aneurysm has ruptured.  Hoarseness.  Cough.  Shortness of breath.  Swallowing problems.  Swelling in the face, arms, or legs.  Fever.  Unexplained weight loss. How is this diagnosed? This condition may be diagnosed with:  An ultrasound.  X-rays.  A CT scan.  An MRI.  Tests to check the arteries for damage or blockages (angiogram). Most unruptured thoracic aortic aneurysms cause no symptoms, so they are often found during exams for other conditions. How is this treated? Treatment for this condition depends on:  The size of the aneurysm.  How fast the aneurysm is growing.  Your age.  Risk factors for rupture. Small aneurysms (2.2 inches, or 5.5 cm, or less) may be managed with:  Medicines to: ? Control blood pressure. ? Manage pain. ? Fight infection.  Regular monitoring. This may include an ultrasound or CT scan every year or every 6 months to see if the aneurysm is getting bigger. Large or fast-growing aneurysms may be treated with surgery. Follow these instructions at home: Eating and drinking  Eat a healthy diet. Your health care provider may recommend that you: ? Lower your salt (sodium) intake. In some people, too much salt  can raise blood pressure and increase the risk for thoracic aortic aneurysm. ? Avoid foods that are high in saturated fat and cholesterol, such as red meat and dairy. ? Eat a diet that is low in sugar. ? Increase your fiber intake by including whole grains, vegetables, and fruits in your diet. Eating these foods may help to lower your blood pressure.  Limit or avoid alcohol as recommended by your health care provider. Lifestyle  Do not use any products  that contain nicotine or tobacco, such as cigarettes and e-cigarettes. If you need help quitting, ask your health care provider.  Maintain a healthy weight.  Check your blood pressure regularly. Follow your health care provider's instructions on how to keep your blood pressure within normal limits.  Have your blood sugar (glucose) level and cholesterol levels checked regularly. Follow your health care provider's instructions on how to keep levels within normal limits. Activity   Stay physically active and exercise regularly. Talk with your health care provider about how often you should exercise and ask which types of exercise are best for you.  Avoid heavy lifting and activities that take a lot of effort. Ask your health care provider what activities are safe for you. General instructions  Take over-the-counter and prescription medicines only as told by your health care provider.  Talk with your health care provider about regular screenings to see if the aneurysm is getting bigger.  Keep all follow-up visits as told by your health care provider. This is important. Contact a health care provider if you have:  Unexplained weight loss. Get help right away if you have:  Pain in your upper back, neck, or abdomen. This pain may move into your chest and arms.  Trouble swallowing.  A cough or hoarseness.  Shortness of breath. Summary  A thoracic aortic aneurysm is an aneurysm that occurs in the first part of the aorta, between the heart and the diaphragm.  As a thoracic aortic aneurysm becomes larger, it can burst (rupture), or blood can flow between the layers of the wall of the aorta through a tear (aorticdissection). These conditions can be life threatening if they are not diagnosed and treated right away.  If you have a thoracic aortic aneurysm, its growth will be closely monitored. Surgical repair may be needed for larger or faster-growing aneurysms. This information is not  intended to replace advice given to you by your health care provider. Make sure you discuss any questions you have with your health care provider. Document Released: 07/22/2005 Document Revised: 09/02/2017 Document Reviewed: 09/02/2017 Elsevier Interactive Patient Education  2019 [employer].

## 2018-09-21 NOTE — Addendum Note (Signed)
Addended by: Erlene Quan on: 09/21/2018 04:34 PM   Modules accepted: Orders
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
## 2018-09-22 ENCOUNTER — Ambulatory Visit (HOSPITAL_COMMUNITY): Payer: PRIVATE HEALTH INSURANCE

## 2018-09-23 ENCOUNTER — Telehealth: Payer: Self-pay | Admitting: Physician Assistant

## 2018-09-23 LAB — URINE CULTURE
MICRO NUMBER:: 204093
Result:: NO GROWTH
SPECIMEN QUALITY:: ADEQUATE

## 2018-09-23 NOTE — Telephone Encounter (Signed)
See note  Copied from [name] [id number]. Topic: General - Call Back - No Documentation >> Sep 23, 2018  4:14 PM Mcneil, Ja-Kwan wrote: Reason for CRM: Pt stated she was just returning the call from a call that she had missed.

## 2018-09-24 ENCOUNTER — Other Ambulatory Visit: Payer: Self-pay | Admitting: Physician Assistant

## 2018-09-24 ENCOUNTER — Telehealth: Payer: Self-pay | Admitting: Student

## 2018-09-24 ENCOUNTER — Encounter: Payer: Self-pay | Admitting: *Deleted

## 2018-09-24 ENCOUNTER — Ambulatory Visit (HOSPITAL_COMMUNITY)
Admission: RE | Admit: 2018-09-24 | Discharge: 2018-09-24 | Disposition: A | Discharge status: Home / Self Care | Payer: PRIVATE HEALTH INSURANCE | Source: Ambulatory Visit | Attending: Physician Assistant | Admitting: Physician Assistant

## 2018-09-24 DIAGNOSIS — R1012 Left upper quadrant pain: Secondary | ICD-10-CM | POA: Diagnosis present

## 2018-09-24 DIAGNOSIS — R300 Dysuria: Secondary | ICD-10-CM | POA: Diagnosis present

## 2018-09-24 DIAGNOSIS — R109 Unspecified abdominal pain: Secondary | ICD-10-CM

## 2018-09-24 NOTE — Telephone Encounter (Signed)
Patient calling to advise that she had bloodwork done by PCP. / tg

## 2018-09-25 ENCOUNTER — Ambulatory Visit: Payer: PRIVATE HEALTH INSURANCE | Admitting: Internal Medicine

## 2018-09-25 NOTE — Progress Notes (Signed)
Labs faxed to [facility] [phone].

## 2018-09-29 ENCOUNTER — Encounter: Payer: PRIVATE HEALTH INSURANCE | Admitting: Internal Medicine

## 2018-10-01 ENCOUNTER — Ambulatory Visit: Payer: PRIVATE HEALTH INSURANCE | Admitting: Internal Medicine

## 2018-10-01 ENCOUNTER — Encounter: Payer: Self-pay | Admitting: Internal Medicine

## 2018-10-01 VITALS — BP 122/84 | HR 72 | Ht 66.0 in | Wt 214.0 lb

## 2018-10-01 DIAGNOSIS — E1143 Type 2 diabetes mellitus with diabetic autonomic (poly)neuropathy: Secondary | ICD-10-CM | POA: Diagnosis not present

## 2018-10-01 DIAGNOSIS — Z794 Long term (current) use of insulin: Secondary | ICD-10-CM | POA: Diagnosis not present

## 2018-10-01 DIAGNOSIS — E1165 Type 2 diabetes mellitus with hyperglycemia: Secondary | ICD-10-CM | POA: Diagnosis not present

## 2018-10-01 MED ORDER — DULAGLUTIDE 0.75 MG/0.5ML ~~LOC~~ SOAJ: 0.7500 mg | SUBCUTANEOUS | 1 refills | Status: DC

## 2018-10-01 NOTE — Patient Instructions (Addendum)
-   Continue Metformin 1000 mg twice a day  - Continue Novolog mix 15 units with each meal  - Start Trulicity 0.17 mg once  a week   - Check sugar before you eat and bedtime   Choose healthy, lower carb lower calorie snacks: toss salad, cooked vegetables, cottage cheese, peanut butter, low fat cheese / string cheese, lower sodium deli meat, tuna salad or chicken salad    HOW TO TREAT LOW BLOOD SUGARS (Blood sugar LESS THAN 70 MG/DL)  Please follow the RULE OF 15 for the treatment of hypoglycemia treatment (when your (blood sugars are less than 70 mg/dL)    STEP 1: Take 15 grams of carbohydrates when your blood sugar is low, which includes:   3-4 GLUCOSE TABS  OR  3-4 OZ OF JUICE OR REGULAR SODA OR  ONE TUBE OF GLUCOSE GEL     STEP 2: RECHECK blood sugar in 15 MINUTES STEP 3: If your blood sugar is still low at the 15 minute recheck --> then, go back to STEP 1 and treat AGAIN with another 15 grams of carbohydrates.

## 2018-10-01 NOTE — Progress Notes (Signed)
Name: Robin [name]  MRN/ DOB: [id number], 1965/10/15   Age/ Sex: 53 y.o., female    PCP: Inda Coke, PA   Reason for Endocrinology Evaluation: Type 2 Diabetes Mellitus     Date of Initial Endocrinology Visit: 10/01/2018     PATIENT IDENTIFIER: Robin [name] is a 54 y.o. female with a past medical history of T2DM, CHF, COPD and GERD. The patient presented for initial endocrinology clinic visit on 10/01/2018 for consultative assistance with her diabetes management.    HPI: [name] was    Diagnosed with T2DM > 20 yrs Prior Medications tried/Intolerance:Janumet, Glipizide  Currently checking blood sugars 0 x / day Hypoglycemia episodes : 0           Hemoglobin A1c has ranged from 7.9% in 2017, peaking at 11.3% in 2020 Patient required assistance for hypoglycemia: no Patient has required hospitalization within the last 1 year from hyper or hypoglycemia: no   In terms of diet, the patient avoids sugar-sweetened beverages. The patient nibbles all day.    HOME DIABETES REGIMEN: Metformin 1000 mg BID Novolog mix 15 units TID    Statin: no ACE-I/ARB: Yes Prior Diabetic Education: long time ago    METER DOWNLOAD SUMMARY: Does not have one    DIABETIC COMPLICATIONS: Microvascular complications:   Neuropathy  Denies: CKD, retinopathy  Last eye exam: Completed many years ago  Macrovascular complications:   Denies: CAD, PVD, CVA   PAST HISTORY: Past Medical History:  Past Medical History:  Diagnosis Date  . Arthritis   . CHF (congestive heart failure) ([place])   . Chronic abdominal pain   . COPD (chronic obstructive pulmonary disease) ([place])   . Drug use   . Essential hypertension, benign   . GERD (gastroesophageal reflux disease)   . Gout 2016  . Normal coronary arteries    3/10 - following abnormal Myoview  . Ovarian cyst   . Type 2 diabetes mellitus ([place])    Past Surgical History:  Past Surgical History:  Procedure Laterality Date  .  ABDOMINAL HYSTERECTOMY  09/10/2011   Procedure: HYSTERECTOMY ABDOMINAL;  Surgeon: Jonnie Kind, MD;  Location: [facility];  Service: Gynecology;  Laterality: N/A;  Abdominal hysterectomy  . CESAREAN SECTION  [id number], and 1994  . CHOLECYSTECTOMY  1995  . SCAR REVISION  09/10/2011   Procedure: SCAR REVISION;  Surgeon: Jonnie Kind, MD;  Location: [facility];  Service: Gynecology;  Laterality: N/A;  Wide Excision of old Cicatrix  . TUBAL LIGATION  1994      Social History:  reports that she has been smoking cigarettes. She has a 7.25 pack-year smoking history. She has never used smokeless tobacco. She reports current drug use. Drugs: Cocaine and Marijuana. She reports that she does not drink alcohol. Family History:  Family History  Problem Relation Age of Onset  . Cirrhosis Mother   . Early death Mother   . Alcohol abuse Mother   . Diabetes type II Father   . Alcohol abuse Father   . Diabetes type II Sister   . Early death Brother   . Alcohol abuse Son   . Anesthesia problems Neg Hx   . Hypotension Neg Hx   . Malignant hyperthermia Neg Hx   . Pseudochol deficiency Neg Hx   . Colon cancer Neg Hx      HOME MEDICATIONS: Allergies as of 10/01/2018      Reactions   Bee Venom Shortness Of Breath, Swelling   Bodily Swelling  Naproxen Other (See Comments)   Effects acid reflux   Penicillins Nausea Only   Has patient had a PCN reaction causing immediate rash, facial/tongue/throat swelling, SOB or lightheadedness with hypotension: no Has patient had a PCN reaction causing severe rash involving mucus membranes or skin necrosis: no Has patient had a PCN reaction that required hospitalization no Has patient had a PCN reaction occurring within the last 10 years: no If all of the above answers are "NO", then may proceed with Cephalosporin       Medication List       Accurate as of October 01, 2018  8:07 AM. Always use your most recent med list.        acetaminophen 650 MG CR  tablet Commonly known as:  TYLENOL 8 HOUR Take 1 tablet (650 mg total) by mouth every 8 (eight) hours.   albuterol (2.5 MG/3ML) 0.083% nebulizer solution Commonly known as:  PROVENTIL Take 3 mLs (2.5 mg total) by nebulization every 6 (six) hours as needed for wheezing or shortness of breath.   albuterol 108 (90 Base) MCG/ACT inhaler Commonly known as:  PROVENTIL HFA;VENTOLIN HFA Inhale 1-2 puffs into the lungs every 6 (six) hours as needed for wheezing or shortness of breath.   aspirin EC 81 MG tablet Take 1 tablet (81 mg total) by mouth daily.   carvedilol 6.25 MG tablet Commonly known as:  COREG Take 1 tablet (6.25 mg total) by mouth 2 (two) times daily with a meal.   furosemide 20 MG tablet Commonly known as:  LASIX Take 1 tablet (20 mg total) by mouth 2 (two) times daily.   gabapentin 300 MG capsule Commonly known as:  NEURONTIN Take 1 capsule (300 mg total) by mouth 3 (three) times daily.   insulin aspart protamine- aspart (70-30) 100 UNIT/ML injection Commonly known as:  NOVOLOG MIX 70/30 Take 15 units 3 times a day with meals   lisinopril 5 MG tablet Commonly known as:  PRINIVIL,ZESTRIL Take 1 tablet (5 mg total) by mouth daily.   metFORMIN 1000 MG tablet Commonly known as:  GLUCOPHAGE Take 1 tablet (1,000 mg total) by mouth 2 (two) times daily with a meal.   omeprazole 40 MG capsule Commonly known as:  PRILOSEC Take 40 mg by mouth 2 (two) times daily.   potassium chloride SA 20 MEQ tablet Commonly known as:  K-DUR,KLOR-CON Take 1 tablet (20 mEq total) by mouth 2 (two) times daily.        ALLERGIES: Allergies  Allergen Reactions  . Bee Venom Shortness Of Breath and Swelling    Bodily Swelling  . Naproxen Other (See Comments)    Effects acid reflux  . Penicillins Nausea Only    Has patient had a PCN reaction causing immediate rash, facial/tongue/throat swelling, SOB or lightheadedness with hypotension: no Has patient had a PCN reaction causing severe  rash involving mucus membranes or skin necrosis: no Has patient had a PCN reaction that required hospitalization no Has patient had a PCN reaction occurring within the last 10 years: no If all of the above answers are "NO", then may proceed with Cephalosporin      REVIEW OF SYSTEMS: A comprehensive ROS was conducted with the patient and is negative except as per HPI and below:  Review of Systems  Respiratory: Positive for cough. Negative for shortness of breath.   Cardiovascular: Negative for chest pain and palpitations.  Genitourinary: Negative for frequency.  Musculoskeletal: Positive for myalgias.  Neurological: Positive for tingling. Negative for tremors.  Endo/Heme/Allergies: Negative for polydipsia.  Psychiatric/Behavioral: Negative for depression. The patient is not nervous/anxious.       OBJECTIVE:   VITAL SIGNS: BP 122/84 (BP Location: Right Arm, Patient Position: Sitting, Cuff Size: Normal)   Pulse 72   Ht 5\' 6"  (1.676 m)   Wt 214 lb (97.1 kg)   LMP 08/21/2011   SpO2 97%   BMI 34.54 kg/m    PHYSICAL EXAM:  General: Pt appears well and is in NAD  Hydration: Well-hydrated with moist mucous membranes and good skin turgor  HEENT: Head: Unremarkable with good dentition. Oropharynx clear without exudate.  Eyes: External eye exam normal without stare, lid lag or exophthalmos.  EOM intact.  PERRL.  Neck: General: Supple without adenopathy or carotid bruits. Thyroid: Thyroid size normal.  No goiter or nodules appreciated. No thyroid bruit.  Lungs: Clear with good BS bilat with no rales, rhonchi, or wheezes  Heart: RRR with normal S1 and S2 and no gallops; no murmurs; no rub  Abdomen: Normoactive bowel sounds, soft, nontender, without masses or organomegaly palpable  Extremities:  Lower extremities - No pretibial edema. No lesions.  Skin: Normal texture and temperature to palpation. No rash noted. No Acanthosis nigricans/skin tags. No lipohypertrophy.  Neuro: MS is good  with appropriate affect, pt is alert and Ox3    DM foot exam:  The skin of the feet is intact without sores or ulcerations. The pedal pulses are 2+ on right and 2+ on left. The sensation is intact to a screening 5.07, 10 gram monofilament bilaterally   DATA REVIEWED:  Lab Results  Component Value Date   HGBA1C 11.3 (H) 08/24/2018   HGBA1C 8.5 (H) 07/10/2016   HGBA1C 7.9 (H) 08/21/2015   Lab Results  Component Value Date   LDLCALC 40 06/09/2011   CREATININE 0.81 09/21/2018         ASSESSMENT / PLAN / RECOMMENDATIONS:   1) Type 2 Diabetes Mellitus,  Poorly controlled, With neuropathic complications - Most recent A1c of 11.3 %. Goal A1c < 7.0 %.    Plan: GENERAL: I have discussed with the patient the pathophysiology of diabetes. We went over the natural progression of the disease. We talked about both insulin resistance and insulin deficiency. We stressed the importance of lifestyle changes including diet and exercise. I explained the complications associated with diabetes including retinopathy, nephropathy, neuropathy as well as increased risk of cardiovascular disease. We went over the benefit seen with glycemic control.    I explained to the patient that diabetic patients are at higher than normal risk for amputations. The patient was informed that diabetes is the number one cause of non-traumatic amputations in [place].  Discussed pharmacokinetics of basal/bolus insulin and the importance of taking prandial insulin with meals.   We also discussed avoiding sugar-sweetened beverages and snacks, when possible.   Patient did not bring any glucose data today, I explained to her the importance of glucose data availability to make proper management decisions.  We discussed adding a GLP-1 agonist, to improve insulin and her insulin sensitivity, patient does not have personal or family history of medullary cancer, patient does not have personal history of pancreatitis.  We discussed  GI side effects such as nausea and diarrhea.  Patient encouraged to call us with any concerns.  MEDICATIONS: - Continue Metformin 1000 mg twice a day  - Continue Novolog mix 15 units with each meal  - Start Trulicity 6.76 mg once  a week   EDUCATION / INSTRUCTIONS:  BG monitoring instructions: Patient is instructed to check her blood sugars 4 times a day, before meals and bedtime.  Call [facility] if: BG persistently < 70 or > 300. . I reviewed the Rule of 15 for the treatment of hypoglycemia in detail with the patient. Literature supplied.   2) Diabetic complications:   Eye: Does night have known diabetic retinopathy.  She was strongly encouraged to see an ophthalmologist ASAP  Neuro/ Feet: Does have known diabetic peripheral neuropathy.  Renal: Patient does not have known baseline CKD. She is on an ACEI/ARB at present.   3) Lipids: Patient is not on a statin.  Patient is a high risk for cardiovascular complications given her diabetes status, it is recommended that she should be on a statin.  I will defer initiating statin therapy to the discretion of her PCP.   4) Hypertension: She is  at goal of < 140/90 mmHg.    Follow-up in 3 weeks   Signed electronically by: Mack Guise, MD  [facility]  [facility] [address], [place] [address] Phone: [phone] FAX: [phone]   CC: Inda Coke, [address] [place] [address] Phone: [phone]  Fax: [phone]    Return to Endocrinology clinic as below: Future Appointments  Date Time Provider [place]  10/02/2018  7:00 AM [facility] 2 [facility] [place]  10/13/2018  3:00 PM [name], PA-C [facility] [place] [facility]  10/16/2018  8:45 AM [place], Lajuan Lines, MD [facility]

## 2018-10-02 ENCOUNTER — Telehealth: Payer: Self-pay | Admitting: Physician Assistant

## 2018-10-02 ENCOUNTER — Ambulatory Visit (HOSPITAL_COMMUNITY)
Admission: RE | Admit: 2018-10-02 | Discharge: 2018-10-02 | Disposition: A | Discharge status: Home / Self Care | Payer: PRIVATE HEALTH INSURANCE | Source: Ambulatory Visit | Attending: Physician Assistant | Admitting: Physician Assistant

## 2018-10-02 DIAGNOSIS — R1012 Left upper quadrant pain: Secondary | ICD-10-CM | POA: Insufficient documentation

## 2018-10-02 IMAGING — CT CT RENAL STONE PROTOCOL
2 of 3 series · 16 of 46 positions shown, 18 images · non-contrast
Comparison: [DATE]

CLINICAL DATA: Left abdominal/flank pain

EXAM:
CT ABDOMEN AND PELVIS WITHOUT CONTRAST
TECHNIQUE: Multidetector CT imaging of the abdomen and pelvis was performed
following the standard protocol without oral and IV contrast.

[Series 4: lung bases · axial · 0.87mm/px · z∈[-148,-26]mm · 13 of 71 slices shown, 15 images]
[im 5/71  soft-tissue]
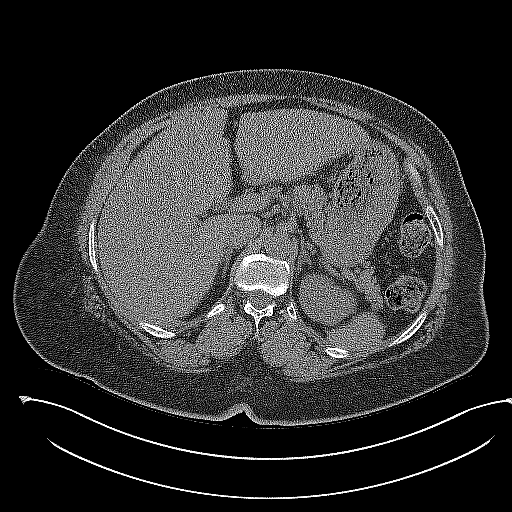
[im 5/71  bone]
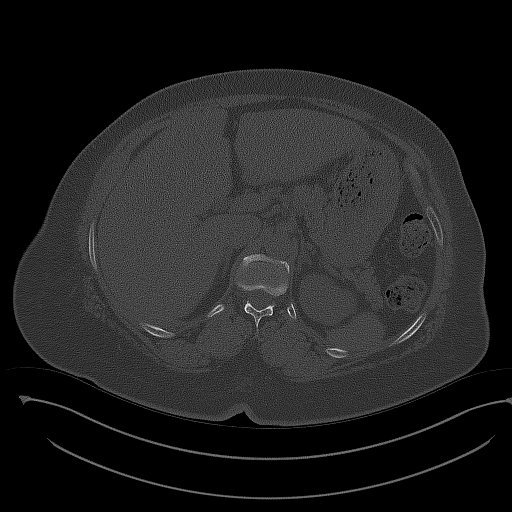
[im 10/71  soft-tissue]
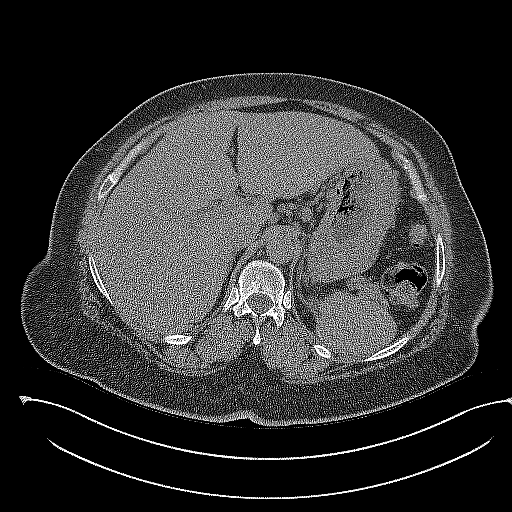
[im 14/71  soft-tissue]
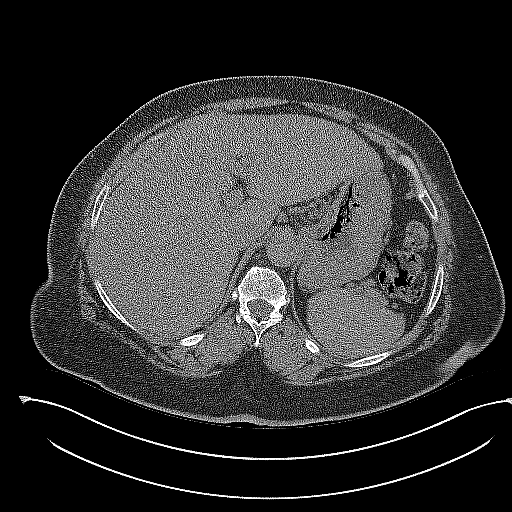
[im 21/71  soft-tissue]
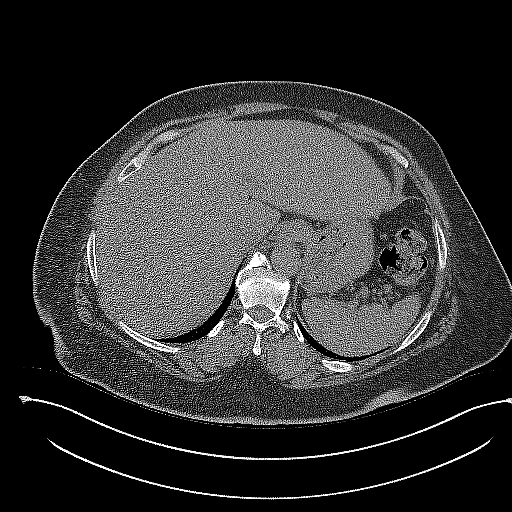
[im 25/71  soft-tissue]
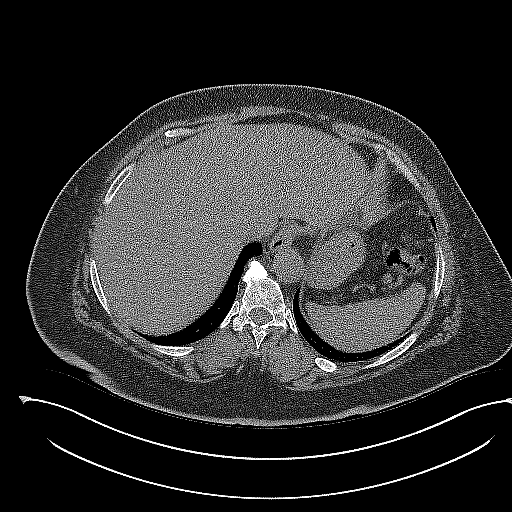
[im 30/71  soft-tissue]
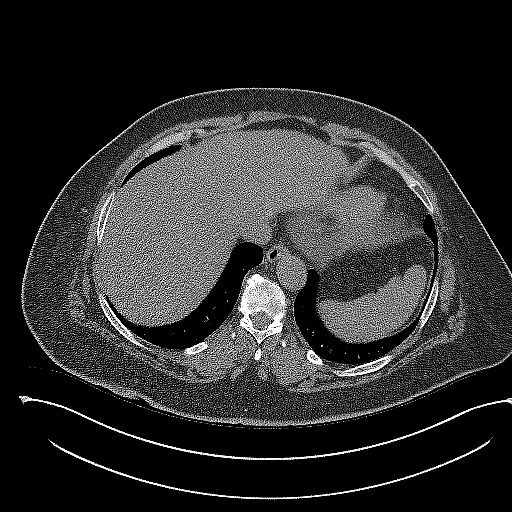
[im 37/71  soft-tissue]
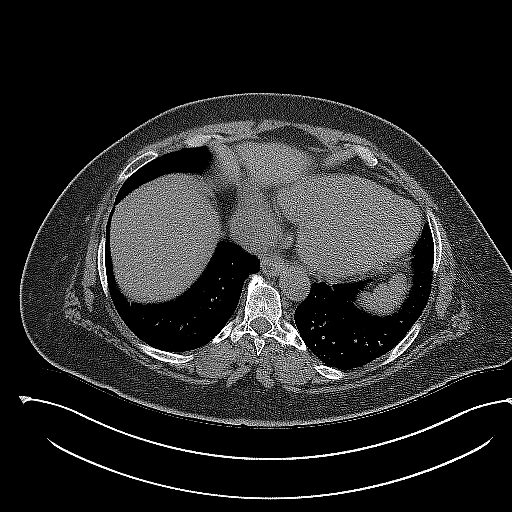
[im 41/71  soft-tissue]
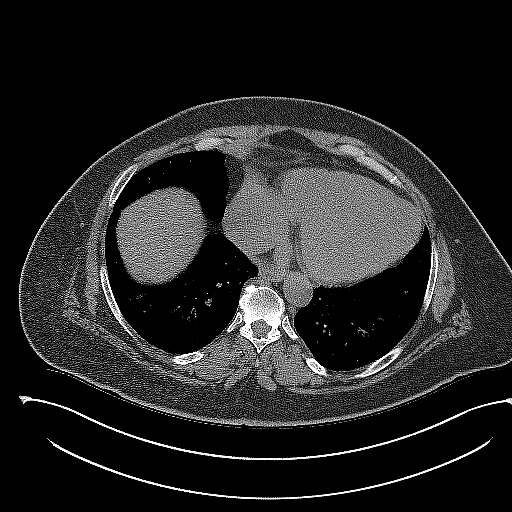
[im 46/71  soft-tissue]
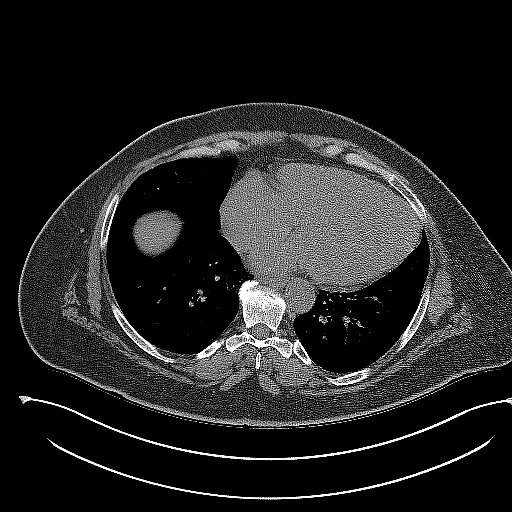
[im 46/71  bone]
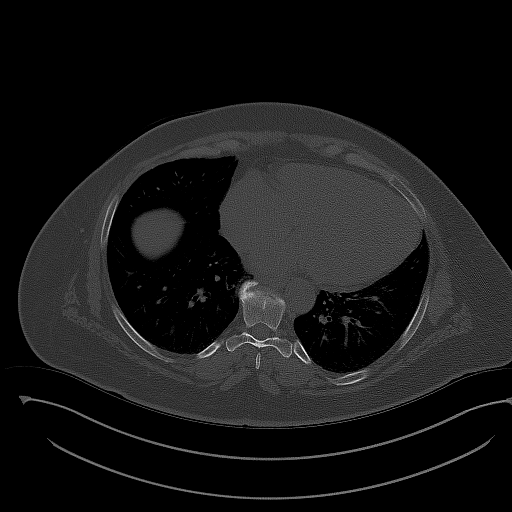
[im 50/71  soft-tissue]
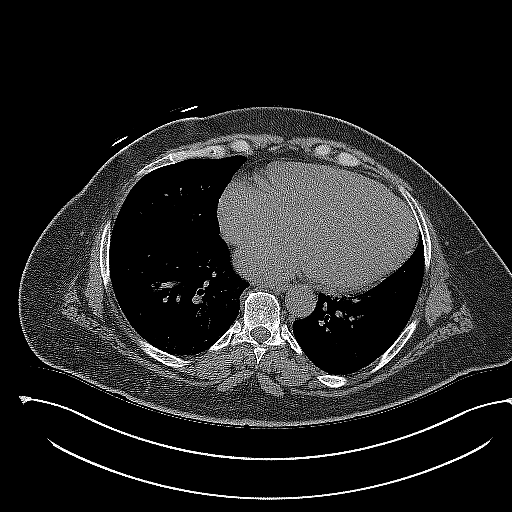
[im 57/71  soft-tissue]
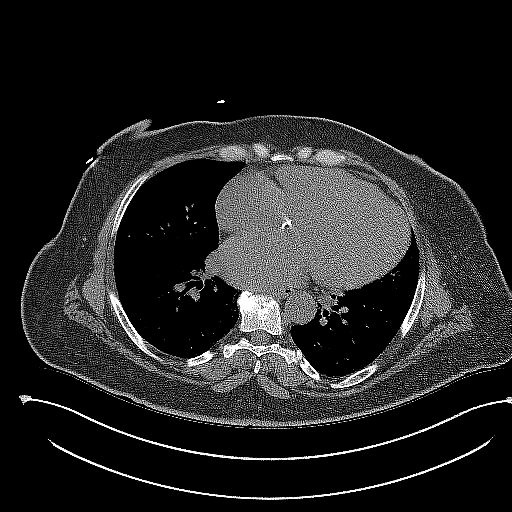
[im 61/71  soft-tissue]
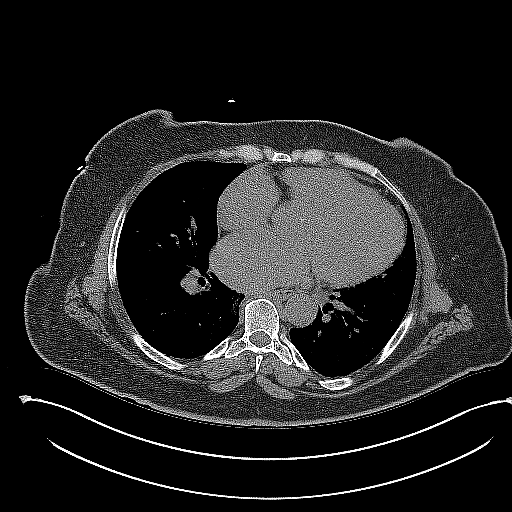
[im 66/71  soft-tissue]
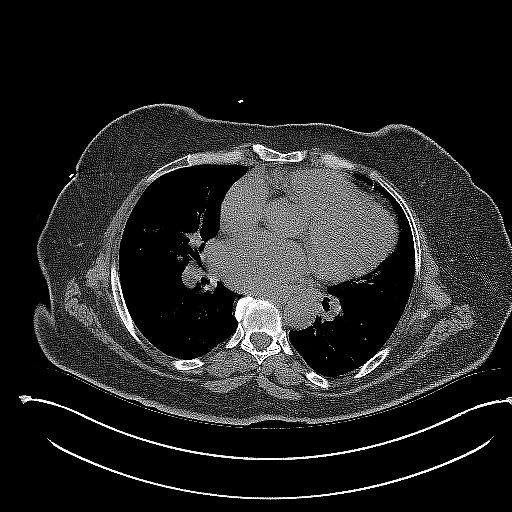

[Series 5: coronal · coronal · 0.86mm/px · 3 of 163 slices shown]
[im 55/163  soft-tissue]
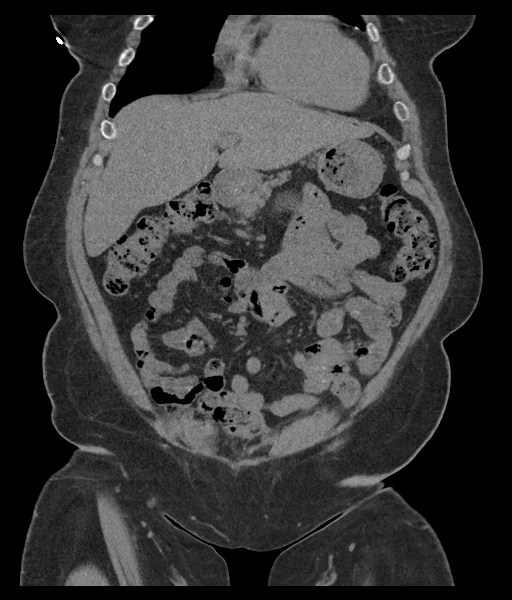
[im 73/163  soft-tissue]
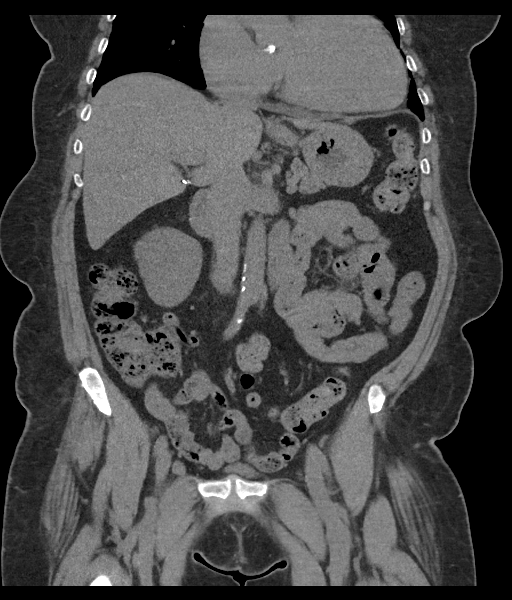
[im 91/163  soft-tissue]
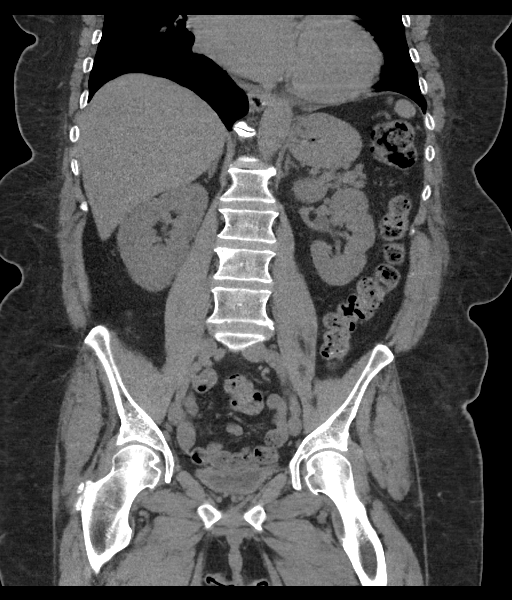

[16 of 46 positions shown; findings below may reference images not displayed]

FINDINGS: Lower chest: Lung bases are clear. Heart is mildly enlarged. There
is mild coronary artery calcification.

Hepatobiliary: No focal liver lesions are appreciable on this
noncontrast enhanced study. Gallbladder is absent. There is no
biliary duct dilatation.

Pancreas: There is no pancreatic mass or inflammatory focus.

Spleen: No splenic lesions are evident.

Adrenals/Urinary Tract: Adrenals bilaterally appear normal. There is
no evident renal mass on either side. There is no hydronephrosis on
either side. There is no evident intrarenal or ureteral calculus on
either side. There are several phleboliths which are near but felt
to be separate from the distal ureters. Urinary bladder is midline.
The urinary bladder wall thickness is upper normal to borderline
increased.

Stomach/Bowel: There is moderate stool in the colon. There is no
evident bowel wall or mesenteric thickening. There is no appreciable
bowel obstruction. There is no free air or portal venous air.

Vascular/Lymphatic: No abdominal aortic aneurysm. There is
aortoiliac atherosclerosis. No adenopathy is evident in the abdomen
or pelvis. Subcentimeter inguinal lymph nodes are considered
nonspecific.

Reproductive: The uterus is absent.  There is no pelvic mass.

Other: The appendix appears normal. There is no abscess or ascites
in the abdomen or pelvis.

Musculoskeletal: There is degenerative change in the lower thoracic
and lumbar regions. There are no blastic or lytic bone lesions.
There is no intramuscular or abdominal wall lesion.
IMPRESSION: 1. Urinary bladder wall thickness is upper normal to borderline
increased. It may be prudent to obtain urinalysis to assess for
possible degree of cystitis.

2. No evident renal or ureteral calculus on either side. No
hydronephrosis.

3. No evident bowel obstruction. No abscess in the abdomen or
pelvis. Appendix appears normal.

4.  Gallbladder absent.  Uterus absent.

5. There is aortoiliac atherosclerosis. There are occasional foci of
coronary artery calcification in visualized cardiac region.

## 2018-10-02 NOTE — Telephone Encounter (Signed)
Copied from [place] [id number]. Topic: Quick Communication - Other Results (Clinic Use ONLY) >> Oct 02, 2018  1:09 PM Scherrie Gerlach wrote: Pt calling back for CT results

## 2018-10-02 NOTE — Telephone Encounter (Signed)
See result note.  

## 2018-10-05 ENCOUNTER — Telehealth: Payer: Self-pay

## 2018-10-05 ENCOUNTER — Ambulatory Visit: Payer: Self-pay

## 2018-10-05 NOTE — Telephone Encounter (Signed)
Pt c/o mild burning with urination that started last Thursday. Denies fever, flank pain, vaginal discharge, genital sores, urgency or blood in urine. Pt advised that she needs to be seen today due to her history of diabetes. Pt stated she cannot make appt today due to her work schedule. Pt given appt with PCP tomorrow at 2:40 pm. Pt advised to call back if develops flank pain, fever or worsening pain.   Reason for Disposition . Diabetes mellitus or weak immune system (e.g., HIV positive, cancer chemo, splenectomy, organ transplant, chronic steroids)    Pt unable to come today wants to come to tomorrow due to work schedule  Answer Assessment - Initial Assessment Questions 1. SEVERITY: "How bad is the pain?"  (e.g., Scale 1-10; mild, moderate, or severe)   - MILD (1-3): complains slightly about urination hurting   - MODERATE (4-7): interferes with normal activities     - SEVERE (8-10): excruciating, unwilling or unable to urinate because of the pain      mild 2. FREQUENCY: "How many times have you had painful urination today?"      3 times 3. PATTERN: "Is pain present every time you urinate or just sometimes?"      everytime 4. ONSET: "When did the painful urination start?"      Last Thursday 5. FEVER: "Do you have a fever?" If so, ask: "What is your temperature, how was it measured, and when did it start?"     no 6. PAST UTI: "Have you had a urine infection before?" If so, ask: "When was the last time?" and "What happened that time?"      no 7. CAUSE: "What do you think is causing the painful urination?"  (e.g., UTI, scratch, Herpes sore)     UTI or bladder infection 8. OTHER SYMPTOMS: "Do you have any other symptoms?" (e.g., flank pain, vaginal discharge, genital sores, urgency, blood in urine)     no 9. PREGNANCY: "Is there any chance you are pregnant?" "When was your last menstrual period?"     n/a  Protocols used: [place]

## 2018-10-05 NOTE — Telephone Encounter (Signed)
Pt approved for trulicity 0.016 mg for 429 days beginning 10/02/2018 ID# [id number] [facility]

## 2018-10-05 NOTE — Telephone Encounter (Signed)
Noted  

## 2018-10-05 NOTE — Telephone Encounter (Signed)
See note

## 2018-10-06 ENCOUNTER — Ambulatory Visit: Payer: PRIVATE HEALTH INSURANCE | Admitting: Physician Assistant

## 2018-10-09 ENCOUNTER — Encounter: Payer: Self-pay | Admitting: Physician Assistant

## 2018-10-13 ENCOUNTER — Encounter: Payer: Self-pay | Admitting: Student

## 2018-10-13 ENCOUNTER — Ambulatory Visit: Payer: PRIVATE HEALTH INSURANCE | Admitting: Student

## 2018-10-13 VITALS — BP 136/78 | HR 68 | Ht 66.0 in | Wt 215.0 lb

## 2018-10-13 DIAGNOSIS — I5022 Chronic systolic (congestive) heart failure: Secondary | ICD-10-CM

## 2018-10-13 DIAGNOSIS — I712 Thoracic aortic aneurysm, without rupture, unspecified: Secondary | ICD-10-CM

## 2018-10-13 DIAGNOSIS — Z79899 Other long term (current) drug therapy: Secondary | ICD-10-CM

## 2018-10-13 DIAGNOSIS — I10 Essential (primary) hypertension: Secondary | ICD-10-CM | POA: Diagnosis not present

## 2018-10-13 DIAGNOSIS — F1910 Other psychoactive substance abuse, uncomplicated: Secondary | ICD-10-CM | POA: Diagnosis not present

## 2018-10-13 MED ORDER — LISINOPRIL 10 MG PO TABS: 10.0000 mg | ORAL_TABLET | Freq: Every day | ORAL | 3 refills | Status: DC

## 2018-10-13 NOTE — Progress Notes (Signed)
Cardiology Office Note    Date:  10/13/2018   ID:  Robin [name], DOB Jul 18, 1966, MRN [id number]  PCP:  Inda Coke, [place]  Cardiologist: Carlyle Dolly, MD    Chief Complaint  Patient presents with  . Follow-up    1 month visit    History of Present Illness:    Robin [name] is a 53 y.o. female with past medical history of chronic systolic CHF (EF at 70-17% by echo in 08/2018), HTN, COPD, thoracic aortic aneurysm, history of normal cors by cath in 2010, and prior substance abuse who presents to the office today for 38-month follow-up.   She was last examined by myself in 09/2018 following a recent hospitalization for an acute CHF exacerbation during which she was found to have a reduced EF of 30 to 35%. It was recommended at that time she continue with medical therapy for 3 months with an echocardiogram obtained for reassessment and if EF did not improve, consider a cardiac catheterization for definitive evaluation. During her office visit, she denied any recurrent chest pain or dyspnea on exertion. She reported compliance with Coreg and Lasix but did self discontinue Losartan due to thinking the medicine caused epistaxis. She had previously tolerated Lisinopril in the past, therefore this was started at 5 mg daily and a repeat BMET was recommended in 3 to 4 weeks for evaluation. This was obtained on 09/21/2018 and showed creatinine remained stable at 0.81 and K+ was 4.4.  In talking with the patient today, she reports overall doing well from a cardiac perspective since her last office visit. 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  Denies any recurrent chest pain or dyspnea on exertion. No recent orthopnea, PND, or lower extremity edema.  She reports good compliance with her medication regimen and denies missing any recent doses. She does not check her blood pressure regularly at home but it is well controlled at 136/78 during today's  visit.  Denies any noted side effects to Lisinopril.   Past Medical History:  Diagnosis Date  . Arthritis   . CHF (congestive heart failure) ([place])    a. EF at 30-35% by echo in 08/2018  . Chronic abdominal pain   . COPD (chronic obstructive pulmonary disease) ([place])   . Drug use   . Essential hypertension, benign   . GERD (gastroesophageal reflux disease)   . Gout 2016  . Normal coronary arteries    3/10 - following abnormal Myoview  . Ovarian cyst   . Type 2 diabetes mellitus ([place])     Past Surgical History:  Procedure Laterality Date  . ABDOMINAL HYSTERECTOMY  09/10/2011   Procedure: HYSTERECTOMY ABDOMINAL;  Surgeon: Jonnie Kind, MD;  Location: [facility];  Service: Gynecology;  Laterality: N/A;  Abdominal hysterectomy  . CESAREAN SECTION  [id number], and 1994  . CHOLECYSTECTOMY  1995  . SCAR REVISION  09/10/2011   Procedure: SCAR REVISION;  Surgeon: Jonnie Kind, MD;  Location: [facility];  Service: Gynecology;  Laterality: N/A;  Wide Excision of old Cicatrix  . TUBAL LIGATION  1994    Current Medications: Outpatient Medications Prior to Visit  Medication Sig Dispense Refill  . acetaminophen (TYLENOL 8 HOUR) 650 MG CR tablet Take 1 tablet (650 mg total) by mouth every 8 (eight) hours. 30 tablet 0  . albuterol (PROVENTIL HFA;VENTOLIN HFA) 108 (90 Base) MCG/ACT inhaler Inhale 1-2 puffs into the lungs every 6 (six) hours  as needed for wheezing or shortness of breath. 1 Inhaler 1  . albuterol (PROVENTIL) (2.5 MG/3ML) 0.083% nebulizer solution Take 3 mLs (2.5 mg total) by nebulization every 6 (six) hours as needed for wheezing or shortness of breath. 75 mL 2  . aspirin EC 81 MG tablet Take 1 tablet (81 mg total) by mouth daily. 30 tablet 0  . carvedilol (COREG) 6.25 MG tablet Take 1 tablet (6.25 mg total) by mouth 2 (two) times daily with a meal. 180 tablet 3  . Dulaglutide (TRULICITY) 9.56 OZ/3.0QM SOPN Inject 0.75 mg into the skin once a week. 4 pen 1  . furosemide (LASIX) 20 MG  tablet Take 1 tablet (20 mg total) by mouth 2 (two) times daily. 180 tablet 3  . gabapentin (NEURONTIN) 300 MG capsule Take 1 capsule (300 mg total) by mouth 3 (three) times daily. 90 capsule 2  . insulin aspart protamine- aspart (NOVOLOG MIX 70/30) (70-30) 100 UNIT/ML injection Take 15 units 3 times a day with meals 10 mL 11  . metFORMIN (GLUCOPHAGE) 1000 MG tablet Take 1 tablet (1,000 mg total) by mouth 2 (two) times daily with a meal. 60 tablet 1  . omeprazole (PRILOSEC) 40 MG capsule Take 40 mg by mouth 2 (two) times daily.    . potassium chloride SA (K-DUR,KLOR-CON) 20 MEQ tablet Take 1 tablet (20 mEq total) by mouth 2 (two) times daily. 180 tablet 3  . lisinopril (PRINIVIL,ZESTRIL) 5 MG tablet Take 1 tablet (5 mg total) by mouth daily. 90 tablet 3   No facility-administered medications prior to visit.      Allergies:   Bee venom; Losartan; Naproxen; and Penicillins   Social History   Socioeconomic History  . Marital status: Single    Spouse name: Not on file  . Number of children: Not on file  . Years of education: Not on file  . Highest education level: Not on file  Occupational History  . Not on file  Social Needs  . Financial resource strain: Not on file  . Food insecurity:    Worry: Not on file    Inability: Not on file  . Transportation needs:    Medical: Not on file    Non-medical: Not on file  Tobacco Use  . Smoking status: Light Tobacco Smoker    Packs/day: 0.25    Years: 29.00    Pack years: 7.25    Types: Cigarettes  . Smokeless tobacco: Never Used  . Tobacco comment: 1 cigarette every 3 days  Substance and Sexual Activity  . Alcohol use: No  . Drug use: Yes    Types: Cocaine, Marijuana  . Sexual activity: Yes    Birth control/protection: Surgical  Lifestyle  . Physical activity:    Days per week: Not on file    Minutes per session: Not on file  . Stress: Not on file  Relationships  . Social connections:    Talks on phone: Not on file    Gets  together: Not on file    Attends religious service: Not on file    Active member of club or organization: Not on file    Attends meetings of clubs or organizations: Not on file    Relationship status: Not on file  Other Topics Concern  . Not on file  Social History Narrative   Part-time at [employer], lives in [place], [place]   3 children    Married     Family History:  The patient's family history includes  Alcohol abuse in her father, mother, and son; Cirrhosis in her mother; Diabetes type II in her father and sister; Early death in her brother and mother.   Review of Systems:   Please see the history of present illness.     General:  No chills, fever, night sweats or weight changes.  Cardiovascular:  No chest pain, dyspnea on exertion, edema, orthopnea, palpitations, paroxysmal nocturnal dyspnea. Dermatological: No rash, lesions/masses Respiratory: No cough, dyspnea Urologic: No hematuria, dysuria Abdominal:   No nausea, vomiting, diarrhea, bright red blood per rectum, melena, or hematemesis Neurologic:  No visual changes, wkns, changes in mental status.  She denies any of the above symptoms.    All other systems reviewed and are otherwise negative except as noted above.   Physical Exam:    VS:  BP 136/78   Pulse 68   Ht 5\' 6"  (1.676 m)   Wt 215 lb (97.5 kg)   LMP 08/21/2011   SpO2 97%   BMI 34.70 kg/m    General: Well developed, well nourished [place] American female appearing in no acute distress. Head: Normocephalic, atraumatic, sclera non-icteric, no xanthomas, nares are without discharge.  Neck: No carotid bruits. JVD not elevated.  Lungs: Respirations regular and unlabored, without wheezes or rales.  Heart: Regular rate and rhythm. No S3 or S4.  No murmur, no rubs, or gallops appreciated. Abdomen: Soft, non-tender, non-distended with normoactive bowel sounds. No hepatomegaly. No rebound/guarding. No obvious abdominal masses. Msk:  Strength and tone appear  normal for age. No joint deformities or effusions. Extremities: No clubbing or cyanosis. No lower extremity edema.  Distal pedal pulses are 2+ bilaterally. Neuro: Alert and oriented X 3. Moves all extremities spontaneously. No focal deficits noted. Psych:  Responds to questions appropriately with a normal affect. Skin: No rashes or lesions noted  Wt Readings from Last 3 Encounters:  10/13/18 215 lb (97.5 kg)  10/01/18 214 lb (97.1 kg)  09/21/18 211 lb 8 oz (95.9 kg)     Studies/Labs Reviewed:   EKG:  EKG is not ordered today.   Recent Labs: 07/07/2018: ALT 18 08/24/2018: B Natriuretic Peptide 700.0; Hemoglobin 14.3; Platelets 230 09/21/2018: BUN 21; Creatinine, Ser 0.81; Potassium 4.4; Sodium 135   Lipid Panel    Component Value Date/Time   CHOL 124 06/09/2011 0646   TRIG 185 (H) 06/09/2011 0646   HDL 47 06/09/2011 0646   CHOLHDL 2.6 06/09/2011 0646   VLDL 37 06/09/2011 0646   LDLCALC 40 06/09/2011 0646    Additional studies/ records that were reviewed today include:   Echocardiogram: 08/24/2018 Study Conclusions  - Left ventricle: Diffuse hypokinesis worse in the inferior basal   and posterior lateral walls. The cavity size was mildly dilated.   Wall thickness was increased in a pattern of mild LVH. Systolic   function was moderately to severely reduced. The estimated   ejection fraction was in the range of 30% to 35%. Doppler   parameters are consistent with both elevated ventricular   end-diastolic filling pressure and elevated left atrial filling   pressure. - Aortic valve: There was mild stenosis. - Left atrium: The atrium was moderately dilated. - Atrial septum: No defect or patent foramen ovale was identified.   Assessment:    1. Chronic systolic (congestive) heart failure ([place])   2. Essential hypertension   3. Thoracic aortic aneurysm without rupture ([place])   4. Substance abuse ([place])   5. Medication management      Plan:   In order of  problems  listed above:  1. Chronic Systolic CHF - She was found to have a reduced EF of 30 to 35% by echocardiogram in 08/2018. She denies any recent dyspnea on exertion, orthopnea, PND, or lower extremity edema. Appears euvolemic by examination. - She previously developed "epistaxis" with Losartan and has refused to be on similar medications which limits the use of Entresto. She is currently on Coreg 6.25 mg twice daily and Lisinopril 5 mg daily and denies any noted side effects. Will plan to titrate Lisinopril to 10 mg daily and repeat a BMET in 2 to 3 weeks. Continue Lasix at 20mg  BID. She will be 3 months out from her prior echocardiogram in 11/2018 and will obtain an echocardiogram to reassess EF at that time. If still reduced, would plan for a cardiac catheterization for definitive evaluation as discussed during her hospitalization and this was reviewed with the patient today. Would also challenge her prior intolerance to Losartan and attempt to initiate Entresto following a 36-hour washout of her ACE-I if EF remains reduced.   2. HTN - BP is well controlled at 136/78 during today's visit. She is currently on Coreg 6.25 mg twice daily and Lisinopril 5 mg daily. Will plan to titrate Lisinopril to 10 mg daily as outlined above.  3. Thoracic Aortic Aneurysm - Noted to have a 4 cm ascending thoracic aortic aneurysm by CT imaging in 08/2018. She will need repeat imaging next year.  4. Substance Use - UDS in 08/2018 was positive for cocaine but she denies any recurrent use. Repeat UDS obtained by her PCP was negative.   Medication Adjustments/Labs and Tests Ordered: Current medicines are reviewed at length with the patient today.  Concerns regarding medicines are outlined above.  Medication changes, Labs and Tests ordered today are listed in the Patient Instructions below. Patient Instructions  Medication Instructions:  Your physician has recommended you make the following change in your medication:    Increase Lisinopril to 10 mg Daily   If you need a refill on your cardiac medications before your next appointment, please call your pharmacy.   Lab work: Your physician recommends that you return for lab work in: 2-3 Weeks               ( 10-27-18)  If you have labs (blood work) drawn today and your tests are completely normal, you will receive your results only by: Marland Kitchen MyChart Message (if you have MyChart) OR . A paper copy in the mail If you have any lab test that is abnormal or we need to change your treatment, we will call you to review the results.  Testing/Procedures: Your physician has requested that you have an echocardiogram. Echocardiography is a painless test that uses sound waves to create images of your heart. It provides your doctor with information about the size and shape of your heart and how well your heart's chambers and valves are working. This procedure takes approximately one hour. There are no restrictions for this procedure.  Follow-Up: At [facility], you and your health needs are our priority.  As part of our continuing mission to provide you with exceptional heart care, we have created designated Provider Care Teams.  These Care Teams include your primary Cardiologist (physician) and Advanced Practice Providers (APPs -  Physician Assistants and Nurse Practitioners) who all work together to provide you with the care you need, when you need it. You will need a follow up appointment in 2 months.  Please call our office  2 months in advance to schedule this appointment.  You may see Carlyle Dolly, MD or one of the following Advanced Practice Providers on your designated Care Team:   Bernerd Pho, PA-C [facility]) . Ermalinda Barrios, PA-C ([place])  Any Other Special Instructions Will Be Listed Below (If Applicable). Thank you for choosing [facility]!    Signed, [name], PA-C  10/13/2018 5:33 PM    [place] [address] [place], [place] [address] Phone: [phone] Fax: [phone]

## 2018-10-13 NOTE — Patient Instructions (Signed)
Medication Instructions:  Your physician has recommended you make the following change in your medication:  Increase Lisinopril to 10 mg Daily   If you need a refill on your cardiac medications before your next appointment, please call your pharmacy.   Lab work: Your physician recommends that you return for lab work in: 2-3 Weeks               ( 10-27-18)  If you have labs (blood work) drawn today and your tests are completely normal, you will receive your results only by: Marland Kitchen MyChart Message (if you have MyChart) OR . A paper copy in the mail If you have any lab test that is abnormal or we need to change your treatment, we will call you to review the results.  Testing/Procedures: Your physician has requested that you have an echocardiogram. Echocardiography is a painless test that uses sound waves to create images of your heart. It provides your doctor with information about the size and shape of your heart and how well your heart's chambers and valves are working. This procedure takes approximately one hour. There are no restrictions for this procedure.    Follow-Up: At [facility], you and your health needs are our priority.  As part of our continuing mission to provide you with exceptional heart care, we have created designated Provider Care Teams.  These Care Teams include your primary Cardiologist (physician) and Advanced Practice Providers (APPs -  Physician Assistants and Nurse Practitioners) who all work together to provide you with the care you need, when you need it. You will need a follow up appointment in 1 months.  Please call our office 2 months in advance to schedule this appointment.  You may see Carlyle Dolly, MD or one of the following Advanced Practice Providers on your designated Care Team:   Bernerd Pho, PA-C [facility]) . Ermalinda Barrios, PA-C ([place])  Any Other Special Instructions Will Be Listed Below (If Applicable). Thank you for choosing  [name]!

## 2018-10-16 ENCOUNTER — Ambulatory Visit (INDEPENDENT_AMBULATORY_CARE_PROVIDER_SITE_OTHER): Payer: PRIVATE HEALTH INSURANCE | Admitting: Internal Medicine

## 2018-10-16 ENCOUNTER — Encounter: Payer: Self-pay | Admitting: Internal Medicine

## 2018-10-16 ENCOUNTER — Other Ambulatory Visit: Payer: Self-pay

## 2018-10-16 VITALS — BP 124/76 | HR 82 | Temp 97.5°F | Ht 66.0 in | Wt 217.1 lb

## 2018-10-16 DIAGNOSIS — Z1211 Encounter for screening for malignant neoplasm of colon: Secondary | ICD-10-CM

## 2018-10-16 DIAGNOSIS — K219 Gastro-esophageal reflux disease without esophagitis: Secondary | ICD-10-CM

## 2018-10-16 DIAGNOSIS — R1013 Epigastric pain: Secondary | ICD-10-CM

## 2018-10-16 MED ORDER — SUPREP BOWEL PREP KIT 17.5-3.13-1.6 GM/177ML PO SOLN: 1.0000 | ORAL | 0 refills | Status: DC

## 2018-10-16 NOTE — Progress Notes (Signed)
Patient ID: Robin [name], female   DOB: 1966-04-13, 53 y.o.   MRN: [id number]  HPI: [name] is a 53 year old female with a past medical history of CHF with EF 30 to 35%, COPD not requiring oxygen, diabetes, GERD, remote drug abuse who is seen in consultation at the request of Inda Coke, PA-C to evaluate upper abdominal pain but also consider colon cancer screening.  She is here alone today.  He reports that she has been experiencing epigastric abdominal pain.  This comes and goes.  Can be severe.  Associated with nausea but not vomiting.  Associated with abdominal bloating.  Does have reflux from time to time consisting of water brash and at times heartburn.  Heartburn is controlled with omeprazole 40 mg twice daily.  She reports that about a year ago she developed upper abdominal pain felt a loud "pop" had immediate reflux and since this time is had ongoing issues with epigastric pain as described above.  Separate from this she occasionally has some pain near the umbilicus which gets better when she urinates.  She states this is been evaluated and no etiology found.  She reports an unremarkable urinalysis, ultrasound and CT scan.  The renal stone protocol CT she reports was performed on 10/02/2018.  This showed urinary bladder wall thickness upper normal to borderline increased.  No renal stones.  No bowel obstruction.  Absent gallbladder absent uterus.  On this exam there was moderate stool in the colon with no bowel wall thickening.  Pancreas was without mass or inflammatory focus.  She reports bowel habits is regular for her occasionally loose but usually only once per day.  Metformin can make her have loose stools.  No blood in her stool or melena.  She has a family history of alcohol abuse in both of her parents and her mother had alcohol-related cirrhosis.  She denies alcohol use.  Past Medical History:  Diagnosis Date  . Arthritis   . CHF (congestive heart failure) ([place])    a. EF at  30-35% by echo in 08/2018  . Chronic abdominal pain   . COPD (chronic obstructive pulmonary disease) ([place])   . Drug use   . Essential hypertension, benign   . GERD (gastroesophageal reflux disease)   . Gout 2016  . Normal coronary arteries    3/10 - following abnormal Myoview  . Ovarian cyst   . Type 2 diabetes mellitus ([place])     Past Surgical History:  Procedure Laterality Date  . ABDOMINAL HYSTERECTOMY  09/10/2011   Procedure: HYSTERECTOMY ABDOMINAL;  Surgeon: Jonnie Kind, MD;  Location: [facility];  Service: Gynecology;  Laterality: N/A;  Abdominal hysterectomy  . CESAREAN SECTION  [id number], and 1994  . CHOLECYSTECTOMY  1995  . SCAR REVISION  09/10/2011   Procedure: SCAR REVISION;  Surgeon: Jonnie Kind, MD;  Location: [facility];  Service: Gynecology;  Laterality: N/A;  Wide Excision of old Cicatrix  . TUBAL LIGATION  1994    Outpatient Medications Prior to Visit  Medication Sig Dispense Refill  . acetaminophen (TYLENOL 8 HOUR) 650 MG CR tablet Take 1 tablet (650 mg total) by mouth every 8 (eight) hours. 30 tablet 0  . albuterol (PROVENTIL HFA;VENTOLIN HFA) 108 (90 Base) MCG/ACT inhaler Inhale 1-2 puffs into the lungs every 6 (six) hours as needed for wheezing or shortness of breath. 1 Inhaler 1  . albuterol (PROVENTIL) (2.5 MG/3ML) 0.083% nebulizer solution Take 3 mLs (2.5 mg total) by nebulization every 6 (six) hours  as needed for wheezing or shortness of breath. 75 mL 2  . aspirin EC 81 MG tablet Take 1 tablet (81 mg total) by mouth daily. 30 tablet 0  . carvedilol (COREG) 6.25 MG tablet Take 1 tablet (6.25 mg total) by mouth 2 (two) times daily with a meal. 180 tablet 3  . Dulaglutide (TRULICITY) 0.73 XT/0.6YI SOPN Inject 0.75 mg into the skin once a week. 4 pen 1  . furosemide (LASIX) 20 MG tablet Take 1 tablet (20 mg total) by mouth 2 (two) times daily. 180 tablet 3  . gabapentin (NEURONTIN) 300 MG capsule Take 1 capsule (300 mg total) by mouth 3 (three) times daily. 90 capsule  2  . insulin aspart protamine- aspart (NOVOLOG MIX 70/30) (70-30) 100 UNIT/ML injection Take 15 units 3 times a day with meals 10 mL 11  . lisinopril (PRINIVIL,ZESTRIL) 10 MG tablet Take 1 tablet (10 mg total) by mouth daily. 90 tablet 3  . metFORMIN (GLUCOPHAGE) 1000 MG tablet Take 1 tablet (1,000 mg total) by mouth 2 (two) times daily with a meal. 60 tablet 1  . omeprazole (PRILOSEC) 40 MG capsule Take 40 mg by mouth 2 (two) times daily.    . potassium chloride SA (K-DUR,KLOR-CON) 20 MEQ tablet Take 1 tablet (20 mEq total) by mouth 2 (two) times daily. 180 tablet 3   No facility-administered medications prior to visit.     Allergies  Allergen Reactions  . Bee Venom Shortness Of Breath and Swelling    Bodily Swelling  . Losartan Other (See Comments)    Nosebleeds per patient report.   . Naproxen Other (See Comments)    Effects acid reflux  . Penicillins Nausea Only    Has patient had a PCN reaction causing immediate rash, facial/tongue/throat swelling, SOB or lightheadedness with hypotension: no Has patient had a PCN reaction causing severe rash involving mucus membranes or skin necrosis: no Has patient had a PCN reaction that required hospitalization no Has patient had a PCN reaction occurring within the last 10 years: no If all of the above answers are "NO", then may proceed with Cephalosporin     Family History  Problem Relation Age of Onset  . Cirrhosis Mother   . Early death Mother   . Alcohol abuse Mother   . Diabetes type II Father   . Alcohol abuse Father   . Diabetes type II Sister   . Early death Brother   . Alcohol abuse Son   . Anesthesia problems Neg Hx   . Hypotension Neg Hx   . Malignant hyperthermia Neg Hx   . Pseudochol deficiency Neg Hx   . Colon cancer Neg Hx     Social History   Tobacco Use  . Smoking status: Light Tobacco Smoker    Packs/day: 0.25    Years: 29.00    Pack years: 7.25    Types: Cigarettes  . Smokeless tobacco: Never Used  .  Tobacco comment: 1 cigarette every 3 days  Substance Use Topics  . Alcohol use: No  . Drug use: Yes    Types: Cocaine, Marijuana    ROS: As per history of present illness, otherwise negative  BP 124/76   Pulse 82   Temp (!) 97.5 F (36.4 C)   Ht 5\' 6"  (1.676 m)   Wt 217 lb 2 oz (98.5 kg)   LMP 08/21/2011   BMI 35.04 kg/m  Constitutional: Well-developed and well-nourished. No distress. HEENT: Normocephalic and atraumatic.  Conjunctivae are normal.  No  scleral icterus. Neck: Neck supple. Trachea midline. Cardiovascular: Normal rate, regular rhythm and intact distal pulses. No M/R/G Pulmonary/chest: Effort normal and breath sounds normal. No wheezing, rales or rhonchi. Abdominal: Soft, epigastric pain, nontender, nondistended. Bowel sounds active throughout.  Extremities: no clubbing, cyanosis, or edema Neurological: Alert and oriented to Taniguchi place and time. Skin: Skin is warm and dry.  Psychiatric: Normal mood and affect. Behavior is normal.  RELEVANT LABS AND IMAGING: CBC    Component Value Date/Time   WBC 6.9 08/24/2018 0531   RBC 5.27 (H) 08/24/2018 0531   HGB 14.3 08/24/2018 0531   HCT 45.0 08/24/2018 0531   PLT 230 08/24/2018 0531   MCV 85.4 08/24/2018 0531   MCH 27.1 08/24/2018 0531   MCHC 31.8 08/24/2018 0531   RDW 13.2 08/24/2018 0531   LYMPHSABS 3.2 07/07/2018 0127   MONOABS 0.4 07/07/2018 0127   EOSABS 0.2 07/07/2018 0127   BASOSABS 0.1 07/07/2018 0127    CMP     Component Value Date/Time   NA 135 09/21/2018 0829   NA 134 (A) 08/24/2018   K 4.4 09/21/2018 0829   CL 100 09/21/2018 0829   CO2 25 09/21/2018 0829   GLUCOSE 297 (H) 09/21/2018 0829   BUN 21 09/21/2018 0829   BUN 15 08/24/2018   CREATININE 0.81 09/21/2018 0829   CREATININE 0.81 07/11/2011 0725   CALCIUM 9.4 09/21/2018 0829   PROT 7.1 07/07/2018 0127   ALBUMIN 3.7 07/07/2018 0127   AST 25 07/07/2018 0127   ALT 18 07/07/2018 0127   ALKPHOS 102 07/07/2018 0127   BILITOT 0.7  07/07/2018 0127   GFRNONAA >60 08/25/2018 0611   GFRAA >60 08/25/2018 0611   CT ABDOMEN AND PELVIS WITHOUT CONTRAST   TECHNIQUE: Multidetector CT imaging of the abdomen and pelvis was performed following the standard protocol without oral and IV contrast.   COMPARISON:  June 17, 2016   FINDINGS: Lower chest: Lung bases are clear. Heart is mildly enlarged. There is mild coronary artery calcification.   Hepatobiliary: No focal liver lesions are appreciable on this noncontrast enhanced study. Gallbladder is absent. There is no biliary duct dilatation.   Pancreas: There is no pancreatic mass or inflammatory focus.   Spleen: No splenic lesions are evident.   Adrenals/Urinary Tract: Adrenals bilaterally appear normal. There is no evident renal mass on either side. There is no hydronephrosis on either side. There is no evident intrarenal or ureteral calculus on either side. There are several phleboliths which are near but felt to be separate from the distal ureters. Urinary bladder is midline. The urinary bladder wall thickness is upper normal to borderline increased.   Stomach/Bowel: There is moderate stool in the colon. There is no evident bowel wall or mesenteric thickening. There is no appreciable bowel obstruction. There is no free air or portal venous air.   Vascular/Lymphatic: No abdominal aortic aneurysm. There is aortoiliac atherosclerosis. No adenopathy is evident in the abdomen or pelvis. Subcentimeter inguinal lymph nodes are considered nonspecific.   Reproductive: The uterus is absent.  There is no pelvic mass.   Other: The appendix appears normal. There is no abscess or ascites in the abdomen or pelvis.   Musculoskeletal: There is degenerative change in the lower thoracic and lumbar regions. There are no blastic or lytic bone lesions. There is no intramuscular or abdominal wall lesion.   IMPRESSION: 1. Urinary bladder wall thickness is upper normal to  borderline increased. It may be prudent to obtain urinalysis to assess for possible  degree of cystitis.   2. No evident renal or ureteral calculus on either side. No hydronephrosis.   3. No evident bowel obstruction. No abscess in the abdomen or pelvis. Appendix appears normal.   4.  Gallbladder absent.  Uterus absent.   5. There is aortoiliac atherosclerosis. There are occasional foci of coronary artery calcification in visualized cardiac region.     Electronically Signed   By: Lowella Grip III M.D.   On: 10/02/2018 12:04  ASSESSMENT/PLAN: 53 year old female with a past medical history of CHF with EF 30 to 35%, COPD not requiring oxygen, diabetes, GERD, remote drug abuse who is seen in consultation at the request of Inda Coke, PA-C to evaluate upper abdominal pain but also consider colon cancer screening.   1.  Epigastric pain despite twice daily PPI/history of GERD --reflux symptoms seem controlled.  Her epigastric pain warrants further evaluation.  Noncontrasted CT scan of the abdomen pelvis was unremarkable.  Rule out ulcer disease.  Rule out H. pylori.  Could also be gastroparesis.  I recommended upper endoscopy.  We discussed the risk, benefits and alternatives and she is agreeable and wishes to proceed.  This will be performed in the outpatient hospital setting due to her history of CHF.  2.  Colon cancer screening --average risk.  Due at this time.  We discussed the risk, benefits and alternatives and she is agreeable to proceed.  It will be performed on the same day as her upper endoscopy in the outpatient hospital setting with monitored anesthesia care due to her CHF.   BT:[facility], [address], [place] [id number]

## 2018-10-16 NOTE — Patient Instructions (Signed)
You have been scheduled for an endoscopy and colonoscopy. Please follow the written instructions given to you at your visit today. Please pick up your prep supplies at the pharmacy within the next 1-3 days. If you use inhalers (even only as needed), please bring them with you on the day of your procedure. Your physician has requested that you go to www.startemmi.com and enter the access code given to you at your visit today. This web site gives a general overview about your procedure. However, you should still follow specific instructions given to you by our office regarding your preparation for the procedure.  If you are age 33 or older, your body mass index should be between 23-30. Your Body mass index is 35.04 kg/m. If this is out of the aforementioned range listed, please consider follow up with your Primary Care Provider.  If you are age 2 or younger, your body mass index should be between 19-25. Your Body mass index is 35.04 kg/m. If this is out of the aformentioned range listed, please consider follow up with your Primary Care Provider.

## 2018-10-20 ENCOUNTER — Ambulatory Visit (INDEPENDENT_AMBULATORY_CARE_PROVIDER_SITE_OTHER): Payer: PRIVATE HEALTH INSURANCE | Admitting: Physician Assistant

## 2018-10-20 ENCOUNTER — Encounter (HOSPITAL_COMMUNITY): Payer: Self-pay | Admitting: *Deleted

## 2018-10-20 ENCOUNTER — Telehealth: Payer: Self-pay | Admitting: *Deleted

## 2018-10-20 ENCOUNTER — Other Ambulatory Visit: Payer: Self-pay

## 2018-10-20 ENCOUNTER — Encounter: Payer: Self-pay | Admitting: Physician Assistant

## 2018-10-20 ENCOUNTER — Ambulatory Visit: Payer: PRIVATE HEALTH INSURANCE | Admitting: Physician Assistant

## 2018-10-20 VITALS — BP 140/80 | HR 77 | Temp 97.9°F

## 2018-10-20 DIAGNOSIS — R05 Cough: Secondary | ICD-10-CM

## 2018-10-20 DIAGNOSIS — R059 Cough, unspecified: Secondary | ICD-10-CM

## 2018-10-20 LAB — POC INFLUENZA A&B (BINAX/QUICKVUE)
Influenza A, POC: NEGATIVE
Influenza B, POC: NEGATIVE

## 2018-10-20 NOTE — Patient Instructions (Signed)
It was great to see you!  Please go to the Coronavirus testing site.  As soon as your results have returned (and if negative) you may return to work.   Push fluids and get plenty of rest. Please return if you are not improving as expected, or if you have high fevers (>101.5) or difficulty swallowing or worsening productive cough.  Call clinic with questions.  I hope you start feeling better soon!
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
## 2018-10-20 NOTE — Progress Notes (Signed)
Robin [name] is a 53 y.o. female here for a new problem.  History of Present Illness:   Chief Complaint  Patient presents with  . Cough    Cough  This is a new problem. The current episode started yesterday. The problem has been gradually improving. The cough is non-productive. Associated symptoms include chills. Pertinent negatives include no chest pain, fever, headaches, rhinorrhea or shortness of breath. Associated symptoms comments: Body aches. Her past medical history is significant for COPD.   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   Past Medical History:  Diagnosis Date  . Arthritis   . CHF (congestive heart failure) ([place])    a. EF at 30-35% by echo in 08/2018  . Chronic abdominal pain   . COPD (chronic obstructive pulmonary disease) ([place])   . Drug use   . Essential hypertension, benign   . GERD (gastroesophageal reflux disease)   . Gout 2016  . Normal coronary arteries    3/10 - following abnormal Myoview  . Ovarian cyst   . Type 2 diabetes mellitus (Robin [name])      Social History   Socioeconomic History  . Marital status: Single    Spouse name: Not on file  . Number of children: Not on file  . Years of education: Not on file  . Highest education level: Not on file  Occupational History  . Not on file  Social Needs  . Financial resource strain: Not on file  . Food insecurity:    Worry: Not on file    Inability: Not on file  . Transportation needs:    Medical: Not on file    Non-medical: Not on file  Tobacco Use  . Smoking status: Light Tobacco Smoker    Packs/day: 0.25    Years: 29.00    Pack years: 7.25    Types: Cigarettes  . Smokeless tobacco: Never Used  . Tobacco comment: 1 cigarette every 3 days  Substance and Sexual Activity  . Alcohol use: No  . Drug use: Yes    Types: Cocaine, Marijuana  . Sexual activity: Yes    Birth control/protection: Surgical  Lifestyle  . Physical activity:     Days per week: Not on file    Minutes per session: Not on file  . Stress: Not on file  Relationships  . Social connections:    Talks on phone: Not on file    Gets together: Not on file    Attends religious service: Not on file    Active member of club or organization: Not on file    Attends meetings of clubs or organizations: Not on file    Relationship status: Not on file  . Intimate partner violence:    Fear of current or ex partner: Not on file    Emotionally abused: Not on file    Physically abused: Not on file    Forced sexual activity: Not on file  Other Topics Concern  . Not on file  Social History Narrative   Part-time at [employer], lives in [place], [place]   3 children    Married    Past Surgical History:  Procedure Laterality Date  . ABDOMINAL HYSTERECTOMY  09/10/2011   Procedure: HYSTERECTOMY ABDOMINAL;  Surgeon: Jonnie Kind, MD;  Location: [facility];  Service: Gynecology;  Laterality: N/A;  Abdominal hysterectomy  . CESAREAN SECTION  [id number], and 1994  . CHOLECYSTECTOMY  1995  .  SCAR REVISION  09/10/2011   Procedure: SCAR REVISION;  Surgeon: Jonnie Kind, MD;  Location: [facility];  Service: Gynecology;  Laterality: N/A;  Wide Excision of old Cicatrix  . TUBAL LIGATION  1994    Family History  Problem Relation Age of Onset  . Cirrhosis Mother   . Early death Mother   . Alcohol abuse Mother   . Diabetes type II Father   . Alcohol abuse Father   . Diabetes type II Sister   . Early death Brother   . Alcohol abuse Son   . Anesthesia problems Neg Hx   . Hypotension Neg Hx   . Malignant hyperthermia Neg Hx   . Pseudochol deficiency Neg Hx   . Colon cancer Neg Hx     Allergies  Allergen Reactions  . Bee Venom Shortness Of Breath and Swelling    Bodily Swelling  . Losartan Other (See Comments)    Nosebleeds per patient report.   . Naproxen Other (See Comments)    Effects acid reflux  . Penicillins Nausea Only    Has patient had a PCN reaction causing  immediate rash, facial/tongue/throat swelling, SOB or lightheadedness with hypotension: no Has patient had a PCN reaction causing severe rash involving mucus membranes or skin necrosis: no Has patient had a PCN reaction that required hospitalization no Has patient had a PCN reaction occurring within the last 10 years: no If all of the above answers are "NO", then may proceed with Cephalosporin     Current Medications:   Current Outpatient Medications:  .  acetaminophen (TYLENOL 8 HOUR) 650 MG CR tablet, Take 1 tablet (650 mg total) by mouth every 8 (eight) hours. (Patient taking differently: Take 650 mg by mouth 3 (three) times daily as needed (pain.). ), Disp: 30 tablet, Rfl: 0 .  albuterol (PROVENTIL HFA;VENTOLIN HFA) 108 (90 Base) MCG/ACT inhaler, Inhale 1-2 puffs into the lungs every 6 (six) hours as needed for wheezing or shortness of breath., Disp: 1 Inhaler, Rfl: 1 .  albuterol (PROVENTIL) (2.5 MG/3ML) 0.083% nebulizer solution, Take 3 mLs (2.5 mg total) by nebulization every 6 (six) hours as needed for wheezing or shortness of breath., Disp: 75 mL, Rfl: 2 .  aspirin EC 81 MG tablet, Take 1 tablet (81 mg total) by mouth daily., Disp: 30 tablet, Rfl: 0 .  carvedilol (COREG) 6.25 MG tablet, Take 1 tablet (6.25 mg total) by mouth 2 (two) times daily with a meal., Disp: 180 tablet, Rfl: 3 .  Dulaglutide (TRULICITY) 3.74 MO/7.0BE SOPN, Inject 0.75 mg into the skin once a week. (Patient taking differently: Inject 0.75 mg into the skin every Monday. ), Disp: 4 pen, Rfl: 1 .  furosemide (LASIX) 20 MG tablet, Take 1 tablet (20 mg total) by mouth 2 (two) times daily., Disp: 180 tablet, Rfl: 3 .  gabapentin (NEURONTIN) 300 MG capsule, Take 1 capsule (300 mg total) by mouth 3 (three) times daily., Disp: 90 capsule, Rfl: 2 .  ibuprofen (ADVIL,MOTRIN) 200 MG tablet, Take 400 mg by mouth every 8 (eight) hours as needed (neuropathy/nerve pain.)., Disp: , Rfl:  .  insulin aspart protamine- aspart (NOVOLOG  MIX 70/30) (70-30) 100 UNIT/ML injection, Take 15 units 3 times a day with meals (Patient taking differently: Inject 15 Units into the skin 3 (three) times daily with meals. ), Disp: 10 mL, Rfl: 11 .  lisinopril (PRINIVIL,ZESTRIL) 10 MG tablet, Take 1 tablet (10 mg total) by mouth daily., Disp: 90 tablet, Rfl: 3 .  metFORMIN (  GLUCOPHAGE) 1000 MG tablet, Take 1 tablet (1,000 mg total) by mouth 2 (two) times daily with a meal., Disp: 60 tablet, Rfl: 1 .  omeprazole (PRILOSEC) 40 MG capsule, Take 40 mg by mouth 2 (two) times daily., Disp: , Rfl:  .  potassium chloride SA (K-DUR,KLOR-CON) 20 MEQ tablet, Take 1 tablet (20 mEq total) by mouth 2 (two) times daily., Disp: 180 tablet, Rfl: 3 .  SUPREP BOWEL PREP KIT 17.5-3.13-1.6 GM/177ML SOLN, Take 1 kit by mouth as directed. For colonoscopy prep, Disp: 2 Bottle, Rfl: 0   Review of Systems:   Review of Systems  Constitutional: Positive for chills. Negative for fever.  HENT: Negative for rhinorrhea.   Respiratory: Positive for cough. Negative for shortness of breath.   Cardiovascular: Negative for chest pain.  Neurological: Negative for headaches.    Vitals:   Vitals:   10/20/18 1438  BP: 140/80  Pulse: 77  Temp: 97.9 F (36.6 C)  TempSrc: Oral  SpO2: 97%     There is no height or weight on file to calculate BMI.  Physical Exam:   Physical Exam Vitals signs and nursing note reviewed.  Constitutional:      General: She is not in acute distress.    Appearance: She is well-developed. She is not ill-appearing or toxic-appearing.  HENT:     Head: Normocephalic and atraumatic.     Right Ear: Tympanic membrane, ear canal and external ear normal. Tympanic membrane is not erythematous, retracted or bulging.     Left Ear: Tympanic membrane, ear canal and external ear normal. Tympanic membrane is not erythematous, retracted or bulging.     Nose: Nose normal.     Right Sinus: No maxillary sinus tenderness or frontal sinus tenderness.     Left  Sinus: No maxillary sinus tenderness or frontal sinus tenderness.     Mouth/Throat:     Pharynx: Uvula midline. No posterior oropharyngeal erythema.  Eyes:     General: Lids are normal.     Conjunctiva/sclera: Conjunctivae normal.  Neck:     Trachea: Trachea normal.  Cardiovascular:     Rate and Rhythm: Normal rate and regular rhythm.     Pulses: Normal pulses.     Heart sounds: Normal heart sounds, S1 normal and S2 normal.     Comments: No LE edema Pulmonary:     Effort: Pulmonary effort is normal.     Breath sounds: Normal breath sounds. No decreased breath sounds, wheezing, rhonchi or rales.  Lymphadenopathy:     Cervical: No cervical adenopathy.  Skin:    General: Skin is warm and dry.  Neurological:     Mental Status: She is alert.     GCS: GCS eye subscore is 4. GCS verbal subscore is 5. GCS motor subscore is 6.  Psychiatric:        Speech: Speech normal.        Behavior: Behavior normal. Behavior is cooperative.      Assessment and Plan:   Keyla was seen today for cough.  Diagnoses and all orders for this visit:  Cough -     POC Influenza A&B(BINAX/QUICKVUE) -     Cancel: Novel Coronavirus, NAA ([facility])  [place] testing site only -     Novel Coronavirus, NAA ([facility])  [place] testing site only; Future   No red flags on exam. Flu test negative. She is requiring a note for work, unfortunately I am unable to r/o COVID-19 in the office today due to supplies.  Given her exposure to elderly patients on a regular basis, discussed screening for COVID-19 at screening site for further work-up. I have placed future order for this. Reviewed return precautions including worsening fever, SOB, worsening cough or other concerns. Push fluids and rest. I recommend that patient follow-up if symptoms worsen or persist despite treatment x 7-10 days, sooner if needed.   . Reviewed expectations re: course of current medical issues. . Discussed self-management of symptoms. . Outlined  signs and symptoms indicating need for more acute intervention. . Patient verbalized understanding and all questions were answered. . See orders for this visit as documented in the electronic medical record. . Patient received an After-Visit Summary.  CMA or LPN served as scribe during this visit. History, Physical, and Plan performed by medical provider. The above documentation has been reviewed and is accurate and complete.   Inda Coke, PA-C

## 2018-10-22 ENCOUNTER — Telehealth: Payer: Self-pay | Admitting: *Deleted

## 2018-10-22 ENCOUNTER — Ambulatory Visit: Payer: Self-pay | Admitting: *Deleted

## 2018-10-22 NOTE — Telephone Encounter (Signed)
Patient is scheduled for hospital endoscopy/colonoscopy at [facility] on tomorrow, 10/23/18. Hospital would like all non-essential cases to be postponed due to COVID-19 pandemic. Please review case and advise whether case should remain on for tomorrow. I will be in touch with hospital to advise.Marland KitchenMarland Kitchen

## 2018-10-22 NOTE — Telephone Encounter (Signed)
Per Dr Hilarie Fredrickson verbal orders, patient is to remain ON for hospital procedure tomorrow. This has already been discussed with Daryel Gerald, RN at [facility].

## 2018-10-22 NOTE — Telephone Encounter (Signed)
Pt calling to inquire about COVID-19 testing results after test was performed on Tuesday, 10/20/18. Pt advised that results will take approximately 4 days to return and she would be notified once results have been received. Pt asking if she has to stay in the house until results come back. Pt advised that until results are received she should remain self quarantined at home until she receives results. Pt verbalized understanding and has no further questions at this time.   Reason for Disposition . COVID-19 testing, questions about who needs it  Protocols used: CORONAVIRUS (COVID-19) EXPOSURE-A-AH

## 2018-10-23 ENCOUNTER — Telehealth: Payer: Self-pay | Admitting: *Deleted

## 2018-10-23 ENCOUNTER — Ambulatory Visit (HOSPITAL_COMMUNITY)
Admission: RE | Admit: 2018-10-23 | Payer: PRIVATE HEALTH INSURANCE | Source: Home / Self Care | Admitting: Internal Medicine

## 2018-10-23 ENCOUNTER — Telehealth: Payer: Self-pay | Admitting: Internal Medicine

## 2018-10-23 ENCOUNTER — Encounter (HOSPITAL_COMMUNITY): Payer: Self-pay | Admitting: Certified Registered Nurse Anesthetist

## 2018-10-23 SURGERY — ESOPHAGOGASTRODUODENOSCOPY (EGD) WITH PROPOFOL
Anesthesia: Monitor Anesthesia Care

## 2018-10-23 NOTE — Telephone Encounter (Signed)
At the request of Dr Hilarie Fredrickson, I have contacted patient to advise that unfortunately, we have to cancel her endoscopy/colonoscopy that was scheduled for today as she has been scheduled for COVID-19 as of 10/20/2018 with no results back as of yet. Patient verbalizes understanding. I explained that we will be in contact with her to reschedule her to a different date as soon as one becomes available. I also advised that we will make sure to provide her with a prep since she has already prepped for procedure today.

## 2018-10-23 NOTE — Progress Notes (Signed)
Per phone conversation between Dr. Edwinna Areola MDA and Dr. Hilarie Fredrickson. Patient EGD and colonoscopy for 10/23/2018 with be rescheduled. Call placed to patient to advise of this, she expresses understanding and agrees. She will contact Dr. Hilarie Fredrickson  Office to discuss rescheduling.

## 2018-10-23 NOTE — Telephone Encounter (Signed)
Called pt  With corona outbreak will reschedule echo to late May  When viral situation improves SOmeone will call to reschedule

## 2018-10-26 ENCOUNTER — Telehealth: Payer: Self-pay | Admitting: Physician Assistant

## 2018-10-26 NOTE — Telephone Encounter (Signed)
Patient called back for results. Please advise Thank you.

## 2018-10-26 NOTE — Telephone Encounter (Signed)
See note

## 2018-10-26 NOTE — Telephone Encounter (Signed)
Spoke to pt told her unfortunately we do not have results back yet, as soon as we do will contact you. Pt verbalized understanding.

## 2018-10-26 NOTE — Telephone Encounter (Signed)
Copied from [place] [phone] [id number]. Topic: Quick Communication - See Telephone Encounter >> Oct 26, 2018  9:18 AM Bea Graff, NT wrote: CRM for notification. See Telephone encounter for: 10/26/18. Pt would like a call with her COVID 19 test result. She also states that she feels much better.

## 2018-10-27 ENCOUNTER — Other Ambulatory Visit (HOSPITAL_COMMUNITY): Payer: PRIVATE HEALTH INSURANCE

## 2018-10-27 NOTE — Telephone Encounter (Signed)
MyChart message sent to pt

## 2018-10-27 NOTE — Telephone Encounter (Signed)
See note

## 2018-10-28 ENCOUNTER — Other Ambulatory Visit: Payer: Self-pay

## 2018-10-29 ENCOUNTER — Ambulatory Visit: Payer: PRIVATE HEALTH INSURANCE | Admitting: Internal Medicine

## 2018-10-29 NOTE — Progress Notes (Deleted)
Name: Robin [name]  Age/ Sex: 53 y.o., female   MRN/ DOB: [id number], 05-21-1966     PCP: Inda Coke, PA   Reason for Endocrinology Evaluation: Type 2 Diabetes Mellitus  Initial Endocrine Consultative Visit: 10/01/2018    PATIENT IDENTIFIER: Robin [name] is a 53 y.o. female with a past medical history of T2DM, CHF, COPD and GERD. The patient has followed with Endocrinology clinic since 10/01/2018 for consultative assistance with management of her diabetes.  DIABETIC HISTORY:  Robin [name] was diagnosed with DM> 20 yrs ago, she has tried Janumet and Glipizide in the past. She has been on insulin for years.  Her hemoglobin A1c has ranged from  7.9% in 2017, peaking at 11.3% in 2020.  On her initial visit she was on Metformin and Novolog Mix   SUBJECTIVE:   During the last visit (10/01/2018): Continued Metformin, and Novolog mix and started Trulicity 1.91 mg weekly.   Today (10/29/2018): Robin [name] is here for a 4 week follow up on diabetes management.  She checks her blood sugars *** times daily, preprandial to breakfast and ***. The patient has *** had hypoglycemic episodes since the last clinic visit, which typically occur *** x / - most often occuring ***. The patient is *** symptomatic with these episodes, with symptoms of {symptoms; hypoglycemia:[id number]}. Otherwise, the patient has not required any recent emergency interventions for hypoglycemia and has not had recent hospitalizations secondary to hyper or hypoglycemic episodes.    ROS: As per HPI and as detailed below: ROS    HOME DIABETES REGIMEN:  Metformin 1000 mg twice a day  Novolog mix 15 units with each meal  Trulicity 4.78 mg once  a week   Statin: *** ACE-I/ARB: *** Prior Diabetic Education: ***   METER DOWNLOAD SUMMARY: Date range evaluated: *** Fingerstick Blood Glucose Tests = *** Average Number Tests/Day = *** Overall  Mean FS Glucose = *** Standard Deviation = ***  BG Ranges: Low = *** High = ***   Hypoglycemic Events/30 Days: BG < 50 = *** Episodes of symptomatic severe hypoglycemia = ***    DIABETIC COMPLICATIONS: Microvascular complications:   ***  Denies:   Last Eye Exam: Completed   Macrovascular complications:   ***  Denies: CAD, CVA, PVD   HISTORY:  Past Medical History:  Past Medical History:  Diagnosis Date   Arthritis    CHF (congestive heart failure) ([place])    a. EF at 30-35% by echo in 08/2018   Chronic abdominal pain    COPD (chronic obstructive pulmonary disease) ([place])    Drug use    Essential hypertension, benign    GERD (gastroesophageal reflux disease)    Gout 2016   Normal coronary arteries    3/10 - following abnormal Myoview   Ovarian cyst    Type 2 diabetes mellitus ([place])     Past Surgical History:  Past Surgical History:  Procedure Laterality Date   ABDOMINAL HYSTERECTOMY  09/10/2011   Procedure: HYSTERECTOMY ABDOMINAL;  Surgeon: Jonnie Kind, MD;  Location: [facility];  Service: Gynecology;  Laterality: N/A;  Abdominal hysterectomy   CESAREAN SECTION  [id number],2130, and [place]  09/10/2011   Procedure: SCAR REVISION;  Surgeon: Jonnie Kind, MD;  Location: [facility];  Service: Gynecology;  Laterality: N/A;  Wide Excision of old Cicatrix   TUBAL LIGATION  1994     Social History:  reports that she has been smoking cigarettes. She  has a 7.25 pack-year smoking history. She has never used smokeless tobacco. She reports current drug use. Drugs: Cocaine and Marijuana. She reports that she does not drink alcohol. Family History:  Family History  Problem Relation Age of Onset   Cirrhosis Mother    Early death Mother    Alcohol abuse Mother    Diabetes type II Father    Alcohol abuse Father    Diabetes type II Sister    Early death Brother    Alcohol abuse Son    Anesthesia problems Neg Hx      Hypotension Neg Hx    Malignant hyperthermia Neg Hx    Pseudochol deficiency Neg Hx    Colon cancer Neg Hx       HOME MEDICATIONS: Allergies as of 10/29/2018      Reactions   Bee Venom Shortness Of Breath, Swelling   Bodily Swelling   Losartan Other (See Comments)   Nosebleeds per patient report.    Naproxen Other (See Comments)   Effects acid reflux   Penicillins Nausea Only   Has patient had a PCN reaction causing immediate rash, facial/tongue/throat swelling, SOB or lightheadedness with hypotension: no Has patient had a PCN reaction causing severe rash involving mucus membranes or skin necrosis: no Has patient had a PCN reaction that required hospitalization no Has patient had a PCN reaction occurring within the last 10 years: no If all of the above answers are "NO", then may proceed with Cephalosporin       Medication List       Accurate as of October 29, 2018  7:27 AM. Always use your most recent med list.        acetaminophen 650 MG CR tablet Commonly known as:  Tylenol 8 Hour Take 1 tablet (650 mg total) by mouth every 8 (eight) hours.   albuterol (2.5 MG/3ML) 0.083% nebulizer solution Commonly known as:  PROVENTIL Take 3 mLs (2.5 mg total) by nebulization every 6 (six) hours as needed for wheezing or shortness of breath.   albuterol 108 (90 Base) MCG/ACT inhaler Commonly known as:  PROVENTIL HFA;VENTOLIN HFA Inhale 1-2 puffs into the lungs every 6 (six) hours as needed for wheezing or shortness of breath.   aspirin EC 81 MG tablet Take 1 tablet (81 mg total) by mouth daily.   carvedilol 6.25 MG tablet Commonly known as:  COREG Take 1 tablet (6.25 mg total) by mouth 2 (two) times daily with a meal.   Dulaglutide 0.75 MG/0.5ML Sopn Commonly known as:  Trulicity Inject 4.17 mg into the skin once a week.   furosemide 20 MG tablet Commonly known as:  LASIX Take 1 tablet (20 mg total) by mouth 2 (two) times daily.   gabapentin 300 MG capsule Commonly  known as:  NEURONTIN Take 1 capsule (300 mg total) by mouth 3 (three) times daily.   ibuprofen 200 MG tablet Commonly known as:  ADVIL,MOTRIN Take 400 mg by mouth every 8 (eight) hours as needed (neuropathy/nerve pain.).   insulin aspart protamine- aspart (70-30) 100 UNIT/ML injection Commonly known as:  NovoLOG Mix 70/30 Take 15 units 3 times a day with meals   lisinopril 10 MG tablet Commonly known as:  PRINIVIL,ZESTRIL Take 1 tablet (10 mg total) by mouth daily.   metFORMIN 1000 MG tablet Commonly known as:  Glucophage Take 1 tablet (1,000 mg total) by mouth 2 (two) times daily with a meal.   omeprazole 40 MG capsule Commonly known as:  PRILOSEC Take 40 mg  by mouth 2 (two) times daily.   potassium chloride SA 20 MEQ tablet Commonly known as:  K-DUR,KLOR-CON Take 1 tablet (20 mEq total) by mouth 2 (two) times daily.   Suprep Bowel Prep Kit 17.5-3.13-1.6 GM/177ML Soln Generic drug:  Na Sulfate-K Sulfate-Mg Sulf Take 1 kit by mouth as directed. For colonoscopy prep        OBJECTIVE:   Vital Signs: LMP 08/21/2011   Wt Readings from Last 3 Encounters:  10/16/18 217 lb 2 oz (98.5 kg)  10/13/18 215 lb (97.5 kg)  10/01/18 214 lb (97.1 kg)     Exam: General: Pt appears well and is in NAD  Neck: General: Supple without adenopathy. Thyroid: Thyroid size normal.  No goiter or nodules appreciated. No thyroid bruit.  Lungs: Clear with good BS bilat with no rales, rhonchi, or wheezes  Heart: RRR with normal S1 and S2 and no gallops; no murmurs; no rub  Abdomen: Normoactive bowel sounds, soft, nontender, without masses or organomegaly palpable  Extremities: No pretibial edema. No tremor.  Skin: Normal texture and temperature to palpation. No rash noted. No Acanthosis nigricans/skin tags.   Neuro: MS is good with appropriate affect, pt is alert and Ox3    DM foot exam: 10/01/2018 The skin of the feet is intact without sores or ulcerations. The pedal pulses are 2+ on right  and 2+ on left. The sensation is intact to a screening 5.07, 10 gram monofilament bilaterally            DATA REVIEWED:  Lab Results  Component Value Date   HGBA1C 11.3 (H) 08/24/2018   HGBA1C 8.5 (H) 07/10/2016   HGBA1C 7.9 (H) 08/21/2015   Lab Results  Component Value Date   LDLCALC 40 06/09/2011   CREATININE 0.81 09/21/2018    ASSESSMENT / PLAN / RECOMMENDATIONS:   1) Type 2 Diabetes Mellitus,  Poorly controlled, With neuropathic complications - Most recent A1c of 11.3 %. Goal A1c < 7.0 %.    Plan: MEDICATIONS:  ***  EDUCATION / INSTRUCTIONS:  BG monitoring instructions: Patient is instructed to check her blood sugars *** times a day, ***.  Call [facility] if: BG persistently < 70 or > 300.  I reviewed the Rule of 15 for the treatment of hypoglycemia in detail with the patient. Literature supplied.  REFERRALS:  ***.   2) Diabetic complications:   Eye: Does *** have known diabetic retinopathy.   Neuro/ Feet: Does *** have known diabetic peripheral neuropathy .   Renal: Patient does *** have known baseline CKD. She   is *** on an ACEI/ARB at present.   3) Lipids: Patient is *** on a statin.  4) Hypertension: *** at goal of < 140/90 mmHg.    F/U in ***    Signed electronically by: Mack Guise, MD  [facility]  [address] [place], [place] [address] Phone: [phone] FAX: [phone]   CC: Inda Coke, [place] [address] Phone: [phone]  Fax: [phone]  Return to Endocrinology clinic as below: Future Appointments  Date Time Provider [place]  10/29/2018  7:30 AM [place] Freyre, Melanie Crazier, MD [facility] None  11/13/2018 10:45 AM [place] Piccolo Barnabas Lister, RD [place] [facility]  12/29/2018 10:30 AM [facility] [place] [facility]

## 2018-10-30 ENCOUNTER — Telehealth: Payer: Self-pay | Admitting: *Deleted

## 2018-10-30 ENCOUNTER — Telehealth: Payer: Self-pay | Admitting: Physician Assistant

## 2018-10-30 NOTE — Telephone Encounter (Signed)
Please call patient.  I know that she is currently awaiting results for COVID swab to return to work.  There are now new guidelines regarding this.  Please ask the following questions: 1. When was the last time patient had fever (>100.4)? 2. Are cough and other respiratory symptoms improved?  (If the answer to number 1 is >3 days ago AND the answer to number 2 is yes, she may return to work.)  If she needs documentation for this for work, please ask what she needs so we can provide this for her.  We will be in touch with her swab result as well.  Inda Coke PA-C

## 2018-10-30 NOTE — Telephone Encounter (Signed)
Pt employer called and said letter needs to be signed. Letter signed and faxed to [phone]. Pt sent letter from My chart to employer would not expect without signature. Letter faxed.

## 2018-10-30 NOTE — Telephone Encounter (Signed)
Pt called in reference to COVID test. Pt said it has been over 2 weeks and she has not gotten her results. Please advise.

## 2018-10-30 NOTE — Telephone Encounter (Signed)
Spoke to pt, apologized to her that we still do not have results of her swab. Asked pt if she has had a fever or cough. Pt said she has not had a fever or cough at all. Pt said she wants to go back to work. Told pt okay, that is fine [place] said you can go back to work  just need to know what work note needs to say. Pt said she will contact work and call right back with info. Told pt okay. Told pt as soon as we receive results will let you know. Pt verbalized understanding.

## 2018-10-30 NOTE — Telephone Encounter (Signed)
Pt called back, said she needs a letter to say that she is negative and can go back to work. Told pt we can not say negative result yet but you are symptom free and have been in quarantine past the quide lines. Told pt will have letter ready by 4:00 pm for pickup it wil be at the front desk. Pt verbalized understanding.

## 2018-11-01 LAB — NOVEL CORONAVIRUS, NAA: SARS-CoV-2, NAA: NOT DETECTED

## 2018-11-04 ENCOUNTER — Ambulatory Visit: Payer: Self-pay | Admitting: *Deleted

## 2018-11-04 ENCOUNTER — Telehealth: Payer: Self-pay | Admitting: Physician Assistant

## 2018-11-04 MED ORDER — OMEPRAZOLE 40 MG PO CPDR: 40.0000 mg | DELAYED_RELEASE_CAPSULE | Freq: Two times a day (BID) | ORAL | 2 refills | Status: DC

## 2018-11-04 NOTE — Telephone Encounter (Signed)
Please call and schedule Webex.

## 2018-11-04 NOTE — Telephone Encounter (Signed)
See note

## 2018-11-04 NOTE — Telephone Encounter (Signed)
Pt notified Rx sent to pharmacy as requested. 

## 2018-11-04 NOTE — Telephone Encounter (Signed)
Message from Valla Leaver sent at 11/04/2018 2:02 PM EDT   Summary: constipated   Patient constipated and bloated from prep for a colonoscopy that was cancelled 10/30/18.          Return call to patient regarding constipation and bloating. Her colonoscopy was cancelled and she has not had a bowel movement since finishing her prep on the 27th. She is able to eat and drink. Because she is a diabetic. She has not taken a laxative but is asking for her provider to call in a laxative/ENEMA into the pharmacy for insurance purposes. She is drinking four 16 ounces of water a day and is also on a diuretic. She denies fever or abd pain, nausea or vomiting. Feels uncomfortable. She normally goes twice a day because of the metformin she takes. She is requesting a call back and will do a virtual visit if needed. Routing to flow at [facility] at [facility] [place] for review. Reason for Disposition . [1] Constipation persists > 1 week AND [2] no improvement after using CARE ADVICE  Protocols used: CONSTIPATION-A-AH

## 2018-11-04 NOTE — Telephone Encounter (Signed)
Copied from [place] [id number]. Topic: Quick Communication - Rx Refill/Question >> Nov 04, 2018  1:56 PM Waylan Rocher, Lumin L wrote: Medication: omeprazole (PRILOSEC) 40 MG capsule  Has the patient contacted their pharmacy? yes  (Agent: If no, request that the patient contact the pharmacy for the refill.) (Agent: If yes, when and what did the pharmacy advise?)  Preferred Pharmacy (with phone number or street name): [place], [place] - [id number] [place] [address]  Phone: [phone] Fax: [phone]  Agent: Please be advised that RX refills may take up to 3 business days. We ask that you follow-up with your pharmacy.

## 2018-11-05 ENCOUNTER — Ambulatory Visit (INDEPENDENT_AMBULATORY_CARE_PROVIDER_SITE_OTHER): Payer: PRIVATE HEALTH INSURANCE | Admitting: Physician Assistant

## 2018-11-05 ENCOUNTER — Encounter: Payer: Self-pay | Admitting: Physician Assistant

## 2018-11-05 VITALS — BP 127/93

## 2018-11-05 DIAGNOSIS — Z794 Long term (current) use of insulin: Secondary | ICD-10-CM

## 2018-11-05 DIAGNOSIS — E1143 Type 2 diabetes mellitus with diabetic autonomic (poly)neuropathy: Secondary | ICD-10-CM | POA: Diagnosis not present

## 2018-11-05 DIAGNOSIS — M79675 Pain in left toe(s): Secondary | ICD-10-CM

## 2018-11-05 DIAGNOSIS — K5900 Constipation, unspecified: Secondary | ICD-10-CM | POA: Diagnosis not present
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
## 2018-11-11 ENCOUNTER — Encounter: Payer: Self-pay | Admitting: Physician Assistant

## 2018-11-11 ENCOUNTER — Other Ambulatory Visit: Payer: Self-pay

## 2018-11-11 ENCOUNTER — Ambulatory Visit (INDEPENDENT_AMBULATORY_CARE_PROVIDER_SITE_OTHER): Payer: PRIVATE HEALTH INSURANCE | Admitting: Physician Assistant

## 2018-11-11 DIAGNOSIS — K5900 Constipation, unspecified: Secondary | ICD-10-CM | POA: Diagnosis not present

## 2018-11-11 MED ORDER — LINACLOTIDE 145 MCG PO CAPS: 145.0000 ug | ORAL_CAPSULE | Freq: Every day | ORAL | 2 refills | Status: DC
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
## 2018-11-12 ENCOUNTER — Other Ambulatory Visit: Payer: Self-pay | Admitting: Physician Assistant

## 2018-11-13 ENCOUNTER — Encounter: Payer: Self-pay | Admitting: Physician Assistant

## 2018-11-13 ENCOUNTER — Ambulatory Visit: Payer: PRIVATE HEALTH INSURANCE | Admitting: Dietician

## 2018-11-17 ENCOUNTER — Encounter: Payer: Self-pay | Admitting: Physician Assistant

## 2018-11-17 ENCOUNTER — Telehealth: Payer: Self-pay | Admitting: *Deleted

## 2018-11-17 NOTE — Telephone Encounter (Signed)
Spoke to pt told her PA was approved for Linzess 145 mcg one caplsule daily. Told pt I notified the pharmacy and if too expensive and needs something else please let us know. Pt verbalized understanding. PA was approved for 1 year.

## 2018-11-30 ENCOUNTER — Ambulatory Visit: Payer: Self-pay | Admitting: Podiatry

## 2018-12-09 ENCOUNTER — Other Ambulatory Visit: Payer: Self-pay | Admitting: Internal Medicine

## 2018-12-09 DIAGNOSIS — R1013 Epigastric pain: Secondary | ICD-10-CM

## 2018-12-09 DIAGNOSIS — Z1211 Encounter for screening for malignant neoplasm of colon: Secondary | ICD-10-CM

## 2018-12-09 DIAGNOSIS — K219 Gastro-esophageal reflux disease without esophagitis: Secondary | ICD-10-CM

## 2018-12-10 ENCOUNTER — Other Ambulatory Visit: Payer: Self-pay | Admitting: Internal Medicine

## 2018-12-12 ENCOUNTER — Other Ambulatory Visit: Payer: Self-pay | Admitting: Internal Medicine

## 2018-12-16 ENCOUNTER — Other Ambulatory Visit: Payer: Self-pay | Admitting: Physician Assistant

## 2018-12-16 ENCOUNTER — Encounter: Payer: Self-pay | Admitting: Physician Assistant

## 2018-12-16 ENCOUNTER — Ambulatory Visit (AMBULATORY_SURGERY_CENTER): Payer: Self-pay | Admitting: *Deleted

## 2018-12-16 ENCOUNTER — Other Ambulatory Visit: Payer: Self-pay

## 2018-12-16 VITALS — Ht 66.0 in | Wt 217.0 lb

## 2018-12-16 DIAGNOSIS — R040 Epistaxis: Secondary | ICD-10-CM

## 2018-12-16 DIAGNOSIS — Z1211 Encounter for screening for malignant neoplasm of colon: Secondary | ICD-10-CM

## 2018-12-16 DIAGNOSIS — R1013 Epigastric pain: Secondary | ICD-10-CM

## 2018-12-16 MED ORDER — SUPREP BOWEL PREP KIT 17.5-3.13-1.6 GM/177ML PO SOLN: 1.0000 | Freq: Once | ORAL | 0 refills | Status: AC

## 2018-12-16 NOTE — Progress Notes (Signed)
No egg or soy allergy known to patient  No issues with past sedation with any surgeries  or procedures, no intubation problems  No diet pills per patient No home 02 use per patient  No blood thinners per patient  Pt denies issues with constipation  No A fib or A flutter  EMMI video sent to pt's e mail  previsit per telephone visit. Patient identifying self per name and dob. Patient able to identify date of procedure, procedure and MD.Instructions given, history collected. Patient verbalizinf understanding. Packet mailed to patient including Suprep coupon, acknowledgement form and instructions. [email or web address] took [place] in March for colonoscopy and procedure was cancelled related to [place]. Patient stating her insurance paid for Suprep in March. Patient to call if any difficulty obtaining prep.

## 2018-12-18 ENCOUNTER — Ambulatory Visit: Payer: PRIVATE HEALTH INSURANCE | Admitting: Dietician

## 2018-12-25 ENCOUNTER — Other Ambulatory Visit (HOSPITAL_COMMUNITY): Admission: RE | Admit: 2018-12-25 | Payer: PRIVATE HEALTH INSURANCE | Source: Ambulatory Visit

## 2018-12-26 NOTE — Progress Notes (Signed)
Patient did not go for drive thru covid test per her pre-procedure instructions because she stated she didn't feel good and didn't want to go.  Patient to call office on Tuesday 5/26 am to reschedule procedure for a later date.  Vista Lawman, RN

## 2018-12-29 ENCOUNTER — Ambulatory Visit (HOSPITAL_COMMUNITY)
Admission: RE | Admit: 2018-12-29 | Payer: PRIVATE HEALTH INSURANCE | Source: Home / Self Care | Admitting: Internal Medicine

## 2018-12-29 ENCOUNTER — Other Ambulatory Visit (HOSPITAL_COMMUNITY): Payer: PRIVATE HEALTH INSURANCE

## 2018-12-29 ENCOUNTER — Telehealth: Payer: Self-pay | Admitting: Internal Medicine

## 2018-12-29 ENCOUNTER — Encounter (HOSPITAL_COMMUNITY): Admission: RE | Payer: Self-pay | Source: Home / Self Care

## 2018-12-29 SURGERY — ESOPHAGOGASTRODUODENOSCOPY (EGD) WITH PROPOFOL
Anesthesia: Monitor Anesthesia Care

## 2018-12-29 NOTE — Telephone Encounter (Signed)
Spoke with pt and let her know we will have to call her back with the next available hospital slot when available.

## 2018-12-29 NOTE — Telephone Encounter (Signed)
Patient states that she is returning phone call to schedule hospital procedure

## 2018-12-29 NOTE — Telephone Encounter (Signed)
Pt called in wanting to resched her hsp procd.

## 2019-01-04 ENCOUNTER — Encounter: Payer: Self-pay | Admitting: Physician Assistant

## 2019-01-04 NOTE — Telephone Encounter (Signed)
Called pt and scheduled virtual visit for 01/05/19

## 2019-01-05 ENCOUNTER — Ambulatory Visit (HOSPITAL_COMMUNITY): Payer: PRIVATE HEALTH INSURANCE

## 2019-01-05 ENCOUNTER — Ambulatory Visit: Payer: PRIVATE HEALTH INSURANCE | Admitting: Physician Assistant

## 2019-01-05 NOTE — Progress Notes (Signed)
Patient was a no show for virtual appointment.  Not seen. No charge.

## 2019-01-06 ENCOUNTER — Encounter: Payer: Self-pay | Admitting: Physician Assistant

## 2019-01-06 ENCOUNTER — Ambulatory Visit (INDEPENDENT_AMBULATORY_CARE_PROVIDER_SITE_OTHER): Payer: PRIVATE HEALTH INSURANCE | Admitting: Physician Assistant

## 2019-01-06 VITALS — Ht 66.0 in | Wt 219.0 lb

## 2019-01-06 DIAGNOSIS — L03116 Cellulitis of left lower limb: Secondary | ICD-10-CM

## 2019-01-06 DIAGNOSIS — G629 Polyneuropathy, unspecified: Secondary | ICD-10-CM | POA: Diagnosis not present

## 2019-01-06 MED ORDER — GABAPENTIN 100 MG PO CAPS: 100.0000 mg | ORAL_CAPSULE | Freq: Three times a day (TID) | ORAL | 3 refills | Status: DC

## 2019-01-06 MED ORDER — DOXYCYCLINE HYCLATE 100 MG PO TABS: 100.0000 mg | ORAL_TABLET | Freq: Two times a day (BID) | ORAL | 0 refills | Status: DC
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
## 2019-01-21 ENCOUNTER — Ambulatory Visit (INDEPENDENT_AMBULATORY_CARE_PROVIDER_SITE_OTHER): Payer: PRIVATE HEALTH INSURANCE | Admitting: Otolaryngology

## 2019-02-12 ENCOUNTER — Other Ambulatory Visit: Payer: Self-pay | Admitting: Internal Medicine

## 2019-02-14 ENCOUNTER — Encounter: Payer: Self-pay | Admitting: Physician Assistant

## 2019-02-15 NOTE — Telephone Encounter (Signed)
Robin [name], spoke to pt and scheduled for next Tuesday at 8:00 AM pt could not come in sooner due to work schedule.

## 2019-02-23 ENCOUNTER — Encounter: Payer: Self-pay | Admitting: Physician Assistant

## 2019-02-23 ENCOUNTER — Ambulatory Visit: Payer: PRIVATE HEALTH INSURANCE | Admitting: Physician Assistant

## 2019-02-23 DIAGNOSIS — Z0289 Encounter for other administrative examinations: Secondary | ICD-10-CM

## 2019-02-23 NOTE — Progress Notes (Deleted)
Robin [name] is a 53 y.o. female here for a follow up of a pre-existing problem.  I acted as a Education administrator for [employer], PA-C [employer], LPN  History of Present Illness:   No chief complaint on file.   HPI  Past Medical History:  Diagnosis Date  . Allergy   . Anemia   . Arthritis   . CHF (congestive heart failure) ([place])    a. EF at 30-35% by echo in 08/2018  . Chronic abdominal pain   . COPD (chronic obstructive pulmonary disease) ([place])   . Drug use   . Essential hypertension, benign   . GERD (gastroesophageal reflux disease)   . Gout 2016  . Normal coronary arteries    3/10 - following abnormal Myoview  . Ovarian cyst   . Type 2 diabetes mellitus ([place])      Social History   Socioeconomic History  . Marital status: Single    Spouse name: Not on file  . Number of children: Not on file  . Years of education: Not on file  . Highest education level: Not on file  Occupational History  . Not on file  Social Needs  . Financial resource strain: Not on file  . Food insecurity    Worry: Not on file    Inability: Not on file  . Transportation needs    Medical: Not on file    Non-medical: Not on file  Tobacco Use  . Smoking status: Former Smoker    Packs/day: 0.25    Years: 29.00    Pack years: 7.25    Types: Cigarettes  . Smokeless tobacco: Never Used  . Tobacco comment: 1 cigarette every 3 days  Substance and Sexual Activity  . Alcohol use: No  . Drug use: Not Currently    Types: Cocaine, Marijuana  . Sexual activity: Yes    Birth control/protection: Surgical  Lifestyle  . Physical activity    Days per week: Not on file    Minutes per session: Not on file  . Stress: Not on file  Relationships  . Social Herbalist on phone: Not on file    Gets together: Not on file    Attends religious service: Not on file    Active member of club or organization: Not on file    Attends meetings of clubs or organizations: Not on file    Relationship  status: Not on file  . Intimate partner violence    Fear of current or ex partner: Not on file    Emotionally abused: Not on file    Physically abused: Not on file    Forced sexual activity: Not on file  Other Topics Concern  . Not on file  Social History Narrative   Part-time at [employer], lives in [place], [place]   3 children    Married    Past Surgical History:  Procedure Laterality Date  . ABDOMINAL HYSTERECTOMY  09/10/2011   Procedure: HYSTERECTOMY ABDOMINAL;  Surgeon: Jonnie Kind, MD;  Location: [facility];  Service: Gynecology;  Laterality: N/A;  Abdominal hysterectomy  . CESAREAN SECTION  [id number], and 1994  . CHOLECYSTECTOMY  1995  . SCAR REVISION  09/10/2011   Procedure: SCAR REVISION;  Surgeon: Jonnie Kind, MD;  Location: [facility];  Service: Gynecology;  Laterality: N/A;  Wide Excision of old Cicatrix  . TUBAL LIGATION  1994    Family History  Problem Relation Age of Onset  . Cirrhosis Mother   .  Early death Mother   . Alcohol abuse Mother   . Diabetes type II Father   . Alcohol abuse Father   . Diabetes type II Sister   . Early death Brother   . Alcohol abuse Son   . Anesthesia problems Neg Hx   . Hypotension Neg Hx   . Malignant hyperthermia Neg Hx   . Pseudochol deficiency Neg Hx   . Colon cancer Neg Hx   . Colon polyps Neg Hx   . Esophageal cancer Neg Hx   . Rectal cancer Neg Hx   . Stomach cancer Neg Hx     Allergies  Allergen Reactions  . Bee Venom Shortness Of Breath and Swelling    Bodily Swelling  . Losartan Other (See Comments)    Nosebleeds per patient report.   . Naproxen Other (See Comments)    Effects acid reflux  . Penicillins Nausea Only    Has patient had a PCN reaction causing immediate rash, facial/tongue/throat swelling, SOB or lightheadedness with hypotension: no Has patient had a PCN reaction causing severe rash involving mucus membranes or skin necrosis: no Has patient had a PCN reaction that required hospitalization no Has  patient had a PCN reaction occurring within the last 10 years: no If all of the above answers are "NO", then may proceed with Cephalosporin     Current Medications:   Current Outpatient Medications:  .  acetaminophen (TYLENOL 8 HOUR) 650 MG CR tablet, Take 1 tablet (650 mg total) by mouth every 8 (eight) hours. (Patient taking differently: Take 1,300 mg by mouth 3 (three) times daily as needed (pain.). ), Disp: 30 tablet, Rfl: 0 .  albuterol (PROVENTIL HFA;VENTOLIN HFA) 108 (90 Base) MCG/ACT inhaler, Inhale 1-2 puffs into the lungs every 6 (six) hours as needed for wheezing or shortness of breath., Disp: 1 Inhaler, Rfl: 1 .  albuterol (PROVENTIL) (2.5 MG/3ML) 0.083% nebulizer solution, Take 3 mLs (2.5 mg total) by nebulization every 6 (six) hours as needed for wheezing or shortness of breath., Disp: 75 mL, Rfl: 2 .  aspirin EC 81 MG tablet, Take 1 tablet (81 mg total) by mouth daily., Disp: 30 tablet, Rfl: 0 .  carvedilol (COREG) 6.25 MG tablet, Take 1 tablet (6.25 mg total) by mouth 2 (two) times daily with a meal., Disp: 180 tablet, Rfl: 3 .  doxycycline (VIBRA-TABS) 100 MG tablet, Take 1 tablet (100 mg total) by mouth 2 (two) times daily., Disp: 20 tablet, Rfl: 0 .  furosemide (LASIX) 20 MG tablet, Take 1 tablet (20 mg total) by mouth 2 (two) times daily., Disp: 180 tablet, Rfl: 3 .  gabapentin (NEURONTIN) 100 MG capsule, Take 1 capsule (100 mg total) by mouth 3 (three) times daily., Disp: 90 capsule, Rfl: 3 .  ibuprofen (ADVIL,MOTRIN) 200 MG tablet, Take 400 mg by mouth every 8 (eight) hours as needed (neuropathy/nerve pain.)., Disp: , Rfl:  .  insulin aspart protamine- aspart (NOVOLOG MIX 70/30) (70-30) 100 UNIT/ML injection, Take 15 units 3 times a day with meals (Patient taking differently: Inject 15 Units into the skin 3 (three) times daily with meals. ), Disp: 10 mL, Rfl: 11 .  linaclotide (LINZESS) 145 MCG CAPS capsule, Take 1 capsule (145 mcg total) by mouth daily before breakfast., Disp:  30 capsule, Rfl: 2 .  lisinopril (PRINIVIL,ZESTRIL) 10 MG tablet, Take 1 tablet (10 mg total) by mouth daily., Disp: 90 tablet, Rfl: 3 .  metFORMIN (GLUCOPHAGE) 1000 MG tablet, Take 1 tablet (1,000 mg total) by mouth  2 (two) times daily with a meal., Disp: 60 tablet, Rfl: 1 .  omeprazole (PRILOSEC) 40 MG capsule, Take 1 capsule by mouth twice daily (Patient taking differently: Take 40 mg by mouth 2 (two) times a day. ), Disp: 180 capsule, Rfl: 0 .  potassium chloride SA (K-DUR,KLOR-CON) 20 MEQ tablet, Take 1 tablet (20 mEq total) by mouth 2 (two) times daily., Disp: 180 tablet, Rfl: 3 .  TRULICITY 7.09 GG/8.3MO SOPN, INJECT 0.75 MG SUBCUTANEOUSLY ONCE A WEEK, Disp: 4 mL, Rfl: 0   Review of Systems:   ROS  Vitals:   There were no vitals filed for this visit.   There is no height or weight on file to calculate BMI.  Physical Exam:   Physical Exam  Results for orders placed or performed in visit on 10/20/18  Novel Coronavirus, NAA ([facility])  Drive up testing site only  Result Value Ref Range   SARS-CoV-2, NAA Not Detected Not Detected    Assessment and Plan:   There are no diagnoses linked to this encounter.  . Reviewed expectations re: course of current medical issues. . Discussed self-management of symptoms. . Outlined signs and symptoms indicating need for more acute intervention. . Patient verbalized understanding and all questions were answered. . See orders for this visit as documented in the electronic medical record. . Patient received an After-Visit Summary.  ***  Inda Coke, PA-C

## 2019-03-05 ENCOUNTER — Other Ambulatory Visit: Payer: Self-pay | Admitting: Physician Assistant

## 2019-03-15 ENCOUNTER — Other Ambulatory Visit: Payer: Self-pay | Admitting: *Deleted

## 2019-03-15 MED ORDER — ALBUTEROL SULFATE (2.5 MG/3ML) 0.083% IN NEBU: 2.5000 mg | INHALATION_SOLUTION | Freq: Four times a day (QID) | RESPIRATORY_TRACT | 0 refills | Status: DC | PRN

## 2019-03-19 ENCOUNTER — Other Ambulatory Visit: Payer: Self-pay

## 2019-04-19 ENCOUNTER — Emergency Department (HOSPITAL_COMMUNITY): Payer: PRIVATE HEALTH INSURANCE

## 2019-04-19 ENCOUNTER — Other Ambulatory Visit: Payer: Self-pay

## 2019-04-19 ENCOUNTER — Encounter (HOSPITAL_COMMUNITY): Payer: Self-pay

## 2019-04-19 ENCOUNTER — Emergency Department (HOSPITAL_COMMUNITY)
Admission: EM | Admit: 2019-04-19 | Discharge: 2019-04-19 | Disposition: A | Discharge status: Home / Self Care | Payer: PRIVATE HEALTH INSURANCE | Attending: Emergency Medicine | Admitting: Emergency Medicine

## 2019-04-19 DIAGNOSIS — R0602 Shortness of breath: Secondary | ICD-10-CM | POA: Insufficient documentation

## 2019-04-19 DIAGNOSIS — J449 Chronic obstructive pulmonary disease, unspecified: Secondary | ICD-10-CM | POA: Diagnosis not present

## 2019-04-19 DIAGNOSIS — I4891 Unspecified atrial fibrillation: Secondary | ICD-10-CM | POA: Insufficient documentation

## 2019-04-19 DIAGNOSIS — I5021 Acute systolic (congestive) heart failure: Secondary | ICD-10-CM | POA: Diagnosis not present

## 2019-04-19 DIAGNOSIS — I110 Hypertensive heart disease with heart failure: Secondary | ICD-10-CM | POA: Diagnosis not present

## 2019-04-19 DIAGNOSIS — Z87891 Personal history of nicotine dependence: Secondary | ICD-10-CM | POA: Insufficient documentation

## 2019-04-19 DIAGNOSIS — Z7982 Long term (current) use of aspirin: Secondary | ICD-10-CM | POA: Diagnosis not present

## 2019-04-19 DIAGNOSIS — Z794 Long term (current) use of insulin: Secondary | ICD-10-CM | POA: Insufficient documentation

## 2019-04-19 DIAGNOSIS — R1013 Epigastric pain: Secondary | ICD-10-CM | POA: Diagnosis present

## 2019-04-19 DIAGNOSIS — E119 Type 2 diabetes mellitus without complications: Secondary | ICD-10-CM | POA: Insufficient documentation

## 2019-04-19 DIAGNOSIS — Z79899 Other long term (current) drug therapy: Secondary | ICD-10-CM | POA: Diagnosis not present

## 2019-04-19 LAB — URINALYSIS, ROUTINE W REFLEX MICROSCOPIC
Bacteria, UA: NONE SEEN
Bilirubin Urine: NEGATIVE
Glucose, UA: >=500 mg/dL — AB
Hgb urine dipstick: NEGATIVE
Ketones, ur: NEGATIVE mg/dL
Leukocytes,Ua: NEGATIVE
Nitrite: NEGATIVE
Protein, ur: NEGATIVE mg/dL
Specific Gravity, Urine: 1.025 (ref 1.005–1.030)
pH: 6 (ref 5.0–8.0)

## 2019-04-19 LAB — COMPREHENSIVE METABOLIC PANEL
ALT: 37 U/L (ref 0–44)
AST: 29 U/L (ref 15–41)
Albumin: 3.4 g/dL — ABNORMAL LOW (ref 3.5–5.0)
Alkaline Phosphatase: 149 U/L — ABNORMAL HIGH (ref 38–126)
Anion gap: 8 (ref 5–15)
BUN: 15 mg/dL (ref 6–20)
CO2: 23 mmol/L (ref 22–32)
Calcium: 8.5 mg/dL — ABNORMAL LOW (ref 8.9–10.3)
Chloride: 104 mmol/L (ref 98–111)
Creatinine, Ser: 0.98 mg/dL (ref 0.44–1.00)
GFR calc Af Amer: >60 mL/min (ref >60)
GFR calc non Af Amer: >60 mL/min (ref >60)
Glucose, Bld: 389 mg/dL — ABNORMAL HIGH (ref 70–99)
Potassium: 3.6 mmol/L (ref 3.5–5.1)
Sodium: 135 mmol/L (ref 135–145)
Total Bilirubin: 0.8 mg/dL (ref 0.3–1.2)
Total Protein: 6.6 g/dL (ref 6.5–8.1)

## 2019-04-19 LAB — CBC
HCT: 42.8 % (ref 36.0–46.0)
Hemoglobin: 13.3 g/dL (ref 12.0–15.0)
MCH: 27.7 pg (ref 26.0–34.0)
MCHC: 31.1 g/dL (ref 30.0–36.0)
MCV: 89.2 fL (ref 80.0–100.0)
Platelets: 228 10*3/uL (ref 150–400)
RBC: 4.8 MIL/uL (ref 3.87–5.11)
RDW: 13.2 % (ref 11.5–15.5)
WBC: 7.3 10*3/uL (ref 4.0–10.5)
nRBC: 0 % (ref 0.0–0.2)

## 2019-04-19 LAB — RAPID URINE DRUG SCREEN, HOSP PERFORMED
Amphetamines: NOT DETECTED
Barbiturates: NOT DETECTED
Benzodiazepines: NOT DETECTED
Cocaine: POSITIVE — AB
Opiates: POSITIVE — AB
Tetrahydrocannabinol: NOT DETECTED

## 2019-04-19 LAB — BRAIN NATRIURETIC PEPTIDE: B Natriuretic Peptide: 598 pg/mL — ABNORMAL HIGH (ref 0.0–100.0)

## 2019-04-19 LAB — TROPONIN I (HIGH SENSITIVITY)
Troponin I (High Sensitivity): 25 ng/L — ABNORMAL HIGH (ref <18)
Troponin I (High Sensitivity): 28 ng/L — ABNORMAL HIGH (ref <18)

## 2019-04-19 LAB — D-DIMER, QUANTITATIVE: D-Dimer, Quant: 0.49 ug/mL-FEU (ref 0.00–0.50)

## 2019-04-19 LAB — LIPASE, BLOOD: Lipase: 31 U/L (ref 11–51)

## 2019-04-19 IMAGING — CT CT ANGIO CHEST-ABD-PELV FOR DISSECTION W/ AND WO/W CM
2 of 7 series · 12 of 46 positions shown, 14 images · IV contrast (omnipaque)
Comparison: CT abdomen pelvis dated [DATE]. CTA chest
dated [DATE].

CLINICAL DATA: Epigastric pain.

EXAM:
CT ANGIOGRAPHY CHEST, ABDOMEN AND PELVIS
TECHNIQUE: Multidetector CT imaging through the chest, abdomen and pelvis was
performed using the standard protocol during bolus administration of
intravenous contrast. Multiplanar reconstructed images and MIPs were
obtained and reviewed to evaluate the vascular anatomy.
CONTRAST:  100mL OMNIPAQUE IOHEXOL 350 MG/ML SOLN

[Series 5: axial arterial · axial · arterial · 0.74mm/px · z∈[-643,-91]mm · 9 of 214 slices shown, 11 images]
[im 15/214  soft-tissue]
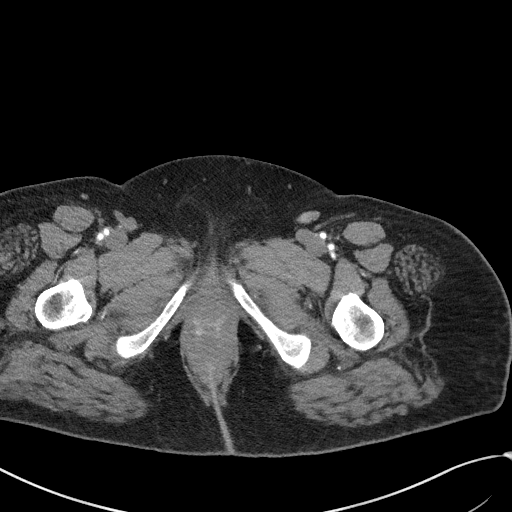
[im 15/214  bone]
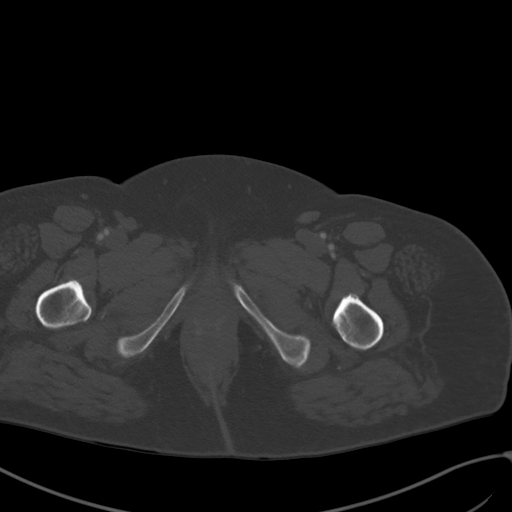
[im 43/214  soft-tissue]
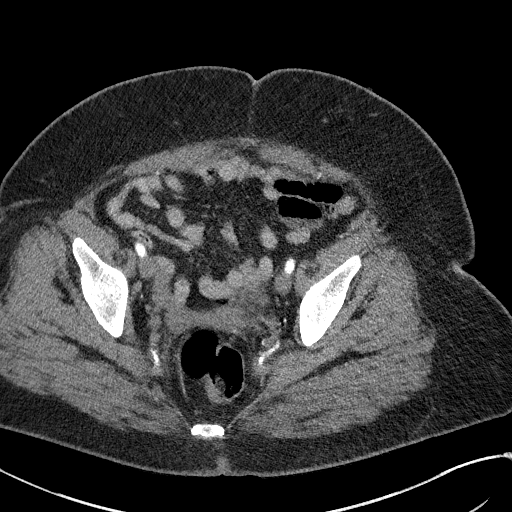
[im 57/214  soft-tissue]
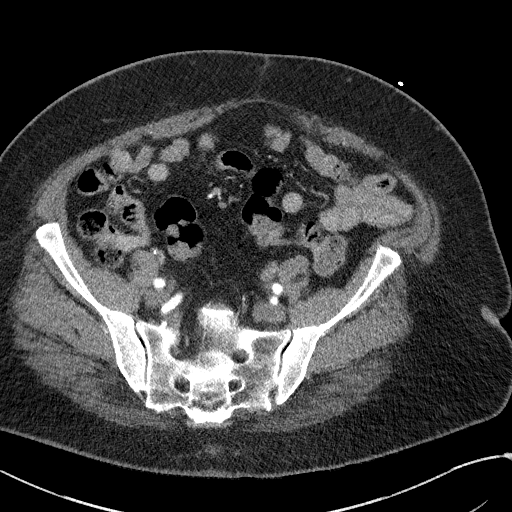
[im 86/214  soft-tissue]
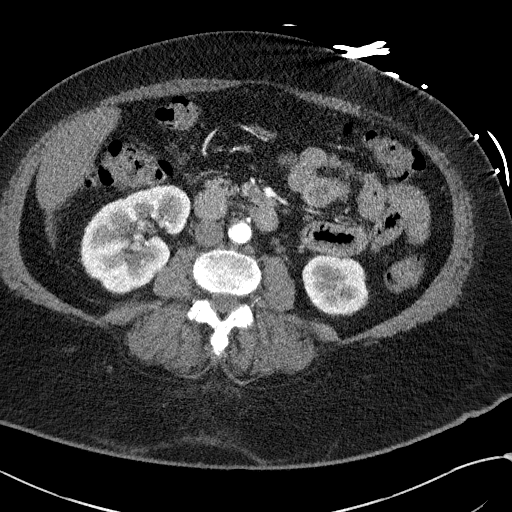
[im 114/214  soft-tissue]
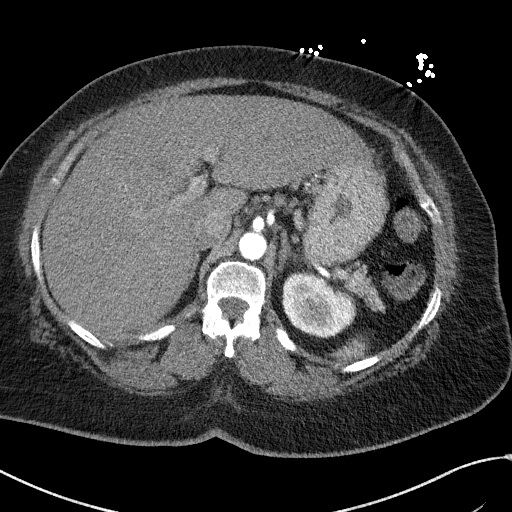
[im 128/214  soft-tissue]
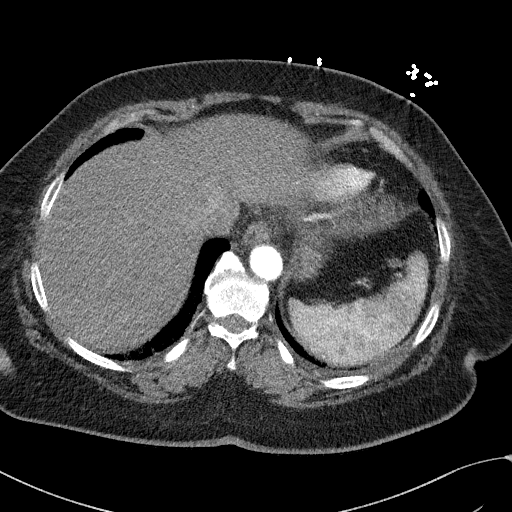
[im 157/214  soft-tissue]
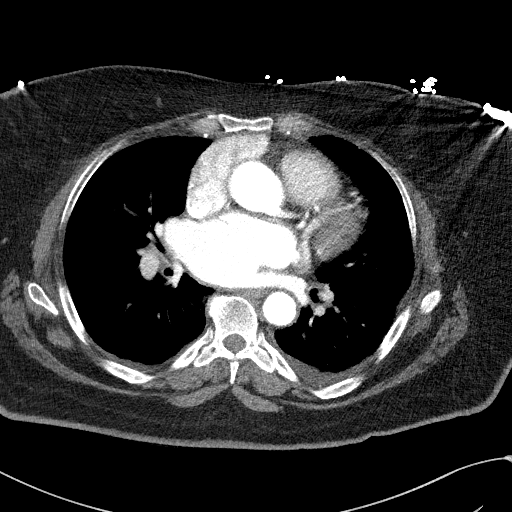
[im 171/214  soft-tissue]
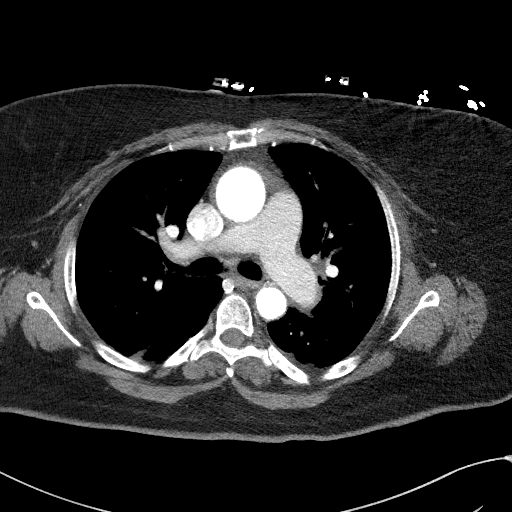
[im 199/214  soft-tissue]
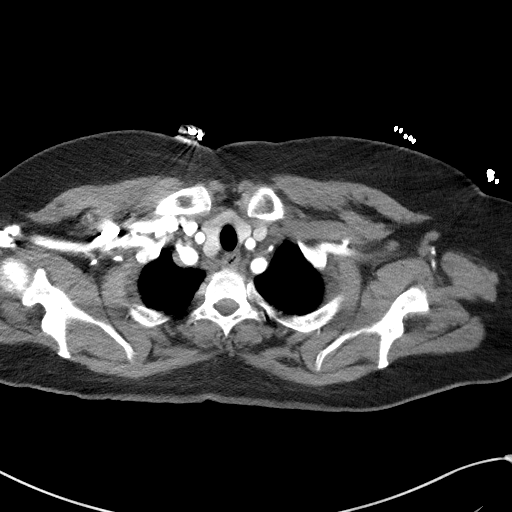
[im 199/214  bone]
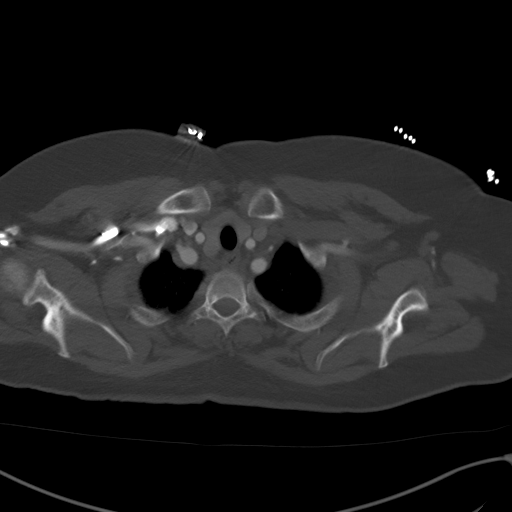

[Series 9: cor soft · coronal · 0.69mm/px · 3 of 150 slices shown]
[im 38/150  soft-tissue]
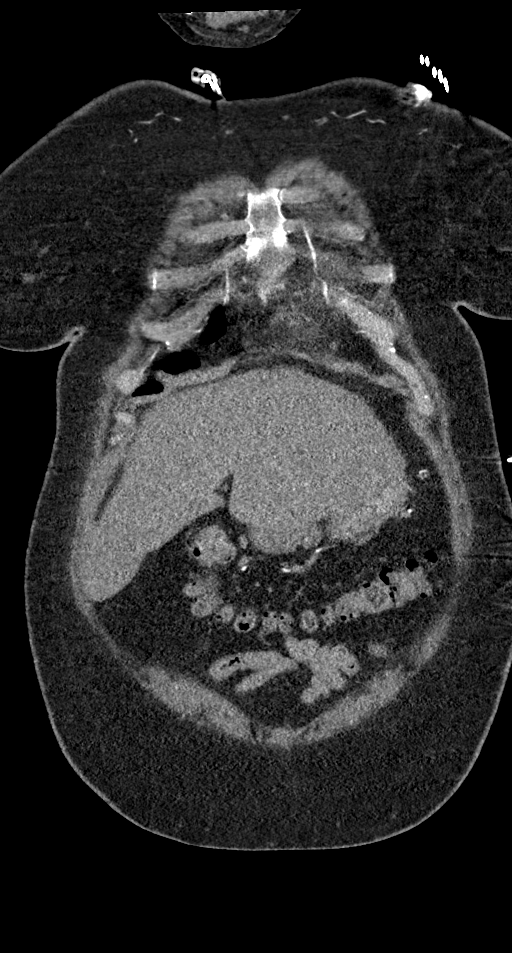
[im 75/150  soft-tissue]
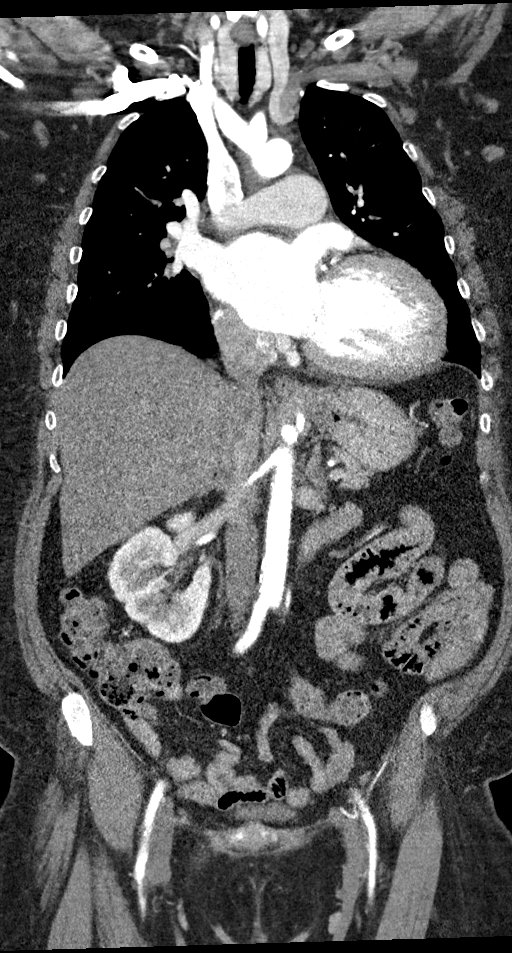
[im 112/150  soft-tissue]
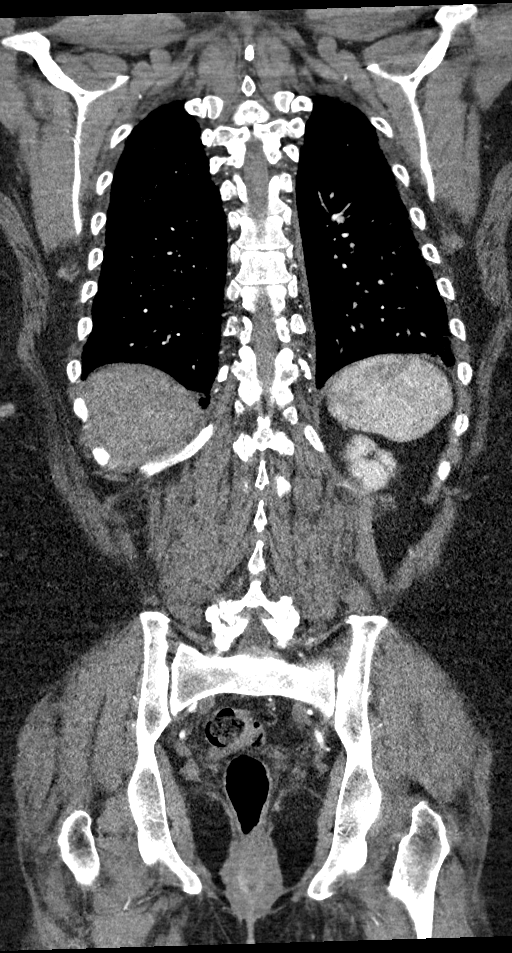

[12 of 46 positions shown; findings below may reference images not displayed]

FINDINGS: CTA CHEST FINDINGS

Cardiovascular: Preferential opacification of the thoracic aorta. No
evidence of thoracic aortic dissection. Unchanged ascending thoracic
aortic aneurysm measuring 4.0 cm. Unchanged moderate cardiomegaly.
No pericardial effusion. No central pulmonary embolism.

Mediastinum/Nodes: No pathologically enlarged mediastinal, hilar, or
axillary lymph nodes. Unchanged subcentimeter mediastinal and hilar
lymph nodes, likely reactive. The thyroid gland, trachea, and
esophagus demonstrate no significant findings.

Lungs/Pleura: New trace left pleural effusion. Scattered mild
interlobular septal thickening throughout both lungs. Minimal
subsegmental atelectasis in both lower lobes. No focal consolidation
or pneumothorax. No suspicious pulmonary nodule.

Musculoskeletal: No chest wall abnormality. No acute or significant
osseous findings.

Review of the MIP images confirms the above findings.

CTA ABDOMEN AND PELVIS FINDINGS

VASCULAR

Aorta: Normal caliber aorta without aneurysm, dissection, vasculitis
or significant stenosis.

Celiac: Patent without evidence of aneurysm, dissection, vasculitis
or significant stenosis.

SMA: Patent without evidence of aneurysm, dissection, vasculitis or
significant stenosis. Replaced right hepatic artery.

Renals: Both renal arteries are patent without evidence of aneurysm,
dissection, vasculitis, fibromuscular dysplasia or significant
stenosis.

IMA: Patent without evidence of aneurysm, dissection, vasculitis or
significant stenosis.

Inflow: Patent without evidence of aneurysm, dissection, vasculitis
or significant stenosis.

Veins: No obvious venous abnormality within the limitations of this
arterial phase study.

Review of the MIP images confirms the above findings.

NON-VASCULAR

Hepatobiliary: No focal liver abnormality is seen. Status post
cholecystectomy. No biliary dilatation.

Pancreas: Unremarkable. No pancreatic ductal dilatation or
surrounding inflammatory changes.

Spleen: Normal in size without focal abnormality.

Adrenals/Urinary Tract: Adrenal glands are unremarkable. Kidneys are
normal, without renal calculi, focal lesion, or hydronephrosis.
Bladder is decompressed.

Stomach/Bowel: Stomach is within normal limits. Appendix appears
normal. No evidence of bowel wall thickening, distention, or
inflammatory changes.

Lymphatic: No enlarged abdominal or pelvic lymph nodes.

Reproductive: Status post hysterectomy. No adnexal masses.

Other: No abdominal wall hernia or abnormality. No abdominopelvic
ascites. No pneumoperitoneum.

Musculoskeletal: No acute or significant osseous findings.

Review of the MIP images confirms the above findings.
IMPRESSION: Vascular:

1. No evidence of acute aortic syndrome.
2. Unchanged ascending thoracic aortic aneurysm measuring 4.0 cm.
Recommend annual imaging followup by CTA or MRA. This recommendation
follows [GL] ACCF/AHA/AATS/ACR/ASA/SCA/JORGE ALBERTO/JORGE ALBERTO/JORGE ALBERTO/JORGE ALBERTO Guidelines
for the Diagnosis and Management of Patients with Thoracic Aortic
Disease. Circulation. [GL]; 121: [id number]. Aortic aneurysm NOS
([GL]-[GL])
3.  Aortic atherosclerosis ([GL]-[GL]).

Chest:

1. Mild interstitial pulmonary edema and trace left pleural
effusion.
2. Unchanged moderate cardiomegaly.

Abdomen and pelvis:

1.  No acute intra-abdominal process.

## 2019-04-19 MED ORDER — MORPHINE SULFATE (PF) 4 MG/ML IV SOLN
4.0000 mg | Freq: Once | INTRAVENOUS | Status: AC
  Administered 2019-04-19: 4 mg via INTRAVENOUS
  Filled 2019-04-19: qty 1

## 2019-04-19 MED ORDER — APIXABAN 5 MG PO TABS: 5.0000 mg | ORAL_TABLET | Freq: Two times a day (BID) | ORAL | 0 refills | Status: DC

## 2019-04-19 MED ORDER — SODIUM CHLORIDE 0.9% FLUSH
3.0000 mL | Freq: Once | INTRAVENOUS | Status: AC
  Administered 2019-04-19: 3 mL via INTRAVENOUS

## 2019-04-19 MED ORDER — DILTIAZEM HCL 50 MG/10ML IV SOLN: 10.0000 mg | Freq: Once | INTRAVENOUS | Status: DC

## 2019-04-19 MED ORDER — CARVEDILOL 6.25 MG PO TABS: 12.5000 mg | ORAL_TABLET | Freq: Two times a day (BID) | ORAL | 3 refills | Status: DC

## 2019-04-19 MED ORDER — DILTIAZEM HCL 25 MG/5ML IV SOLN
10.0000 mg | Freq: Once | INTRAVENOUS | Status: AC
  Administered 2019-04-19: 10 mg via INTRAVENOUS
  Filled 2019-04-19: qty 5

## 2019-04-19 MED ORDER — IOHEXOL 350 MG/ML SOLN
100.0000 mL | Freq: Once | INTRAVENOUS | Status: AC | PRN
  Administered 2019-04-19: 100 mL via INTRAVENOUS

## 2019-04-19 NOTE — Discharge Instructions (Addendum)
You were seen in the ER today for abdominal pain and trouble breathing.   You were found to be in atrial fibrillation- an irregular heart rhythm.  Please see attached handout for details.  Given his new heart rhythm we are starting you on Eliquis, a blood thinner, please see attached handout regarding details of this medicine.  Taking this medicine this increases your risk for bleeding, therefore if you start to notice blood in your urine, blood in your stool, coughing up blood, heavily bleeding wounds, or you hit your head you need to return to the ER immediately.  Do not take anti-inflammatories over-the-counter or aspirin with this medicine.  Specifically stop taking aspirin.  Your carvedilol 6.25 mg that you take twice per day is being increased to 12.5 mg twice per day, we have given you a new prescription for a 30-day supply of this.  Please take these medications as prescribed.  We have prescribed you new medication(s) today. Discuss the medications prescribed today with your pharmacist as they can have adverse effects and interactions with your other medicines including over the counter and prescribed medications. Seek medical evaluation if you start to experience new or abnormal symptoms after taking one of these medicines, seek care immediately if you start to experience difficulty breathing, feeling of your throat closing, facial swelling, or rash as these could be indications of a more serious allergic reaction  Your CT scan showed some enlargement of your heart and your anerysm that needs annual imaging follow up- discuss with your primary care or heart doctor.   Please follow-up with cardiology within 3 days.  Return to the ER for new or worsening symptoms including but not limited to pain, trouble breathing, passing out, vomiting, fever, or any other concerns.

## 2019-04-19 NOTE — ED Triage Notes (Signed)
Pt presents to ED with complaints of epigastric abdominal pain which started Saturday. Pt states pain starts in her mid upper abdomen and radiates around to the left side. Pt denies N/V/D. Pt states it started when she was mowing the yard. Pt states she is also dizzy.
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
## 2019-05-10 ENCOUNTER — Other Ambulatory Visit: Payer: Self-pay

## 2019-05-10 ENCOUNTER — Encounter (HOSPITAL_COMMUNITY): Payer: Self-pay | Admitting: *Deleted

## 2019-05-10 ENCOUNTER — Emergency Department (HOSPITAL_COMMUNITY): Payer: PRIVATE HEALTH INSURANCE

## 2019-05-10 ENCOUNTER — Inpatient Hospital Stay (HOSPITAL_COMMUNITY)
Admission: EM | Admit: 2019-05-10 | Discharge: 2019-05-14 | DRG: 291 | Disposition: A | Discharge status: Home / Self Care | Payer: PRIVATE HEALTH INSURANCE | Attending: Family Medicine | Admitting: Family Medicine

## 2019-05-10 DIAGNOSIS — Z794 Long term (current) use of insulin: Secondary | ICD-10-CM

## 2019-05-10 DIAGNOSIS — E1143 Type 2 diabetes mellitus with diabetic autonomic (poly)neuropathy: Secondary | ICD-10-CM | POA: Diagnosis present

## 2019-05-10 DIAGNOSIS — Z7901 Long term (current) use of anticoagulants: Secondary | ICD-10-CM | POA: Diagnosis not present

## 2019-05-10 DIAGNOSIS — I482 Chronic atrial fibrillation, unspecified: Secondary | ICD-10-CM | POA: Diagnosis present

## 2019-05-10 DIAGNOSIS — J9601 Acute respiratory failure with hypoxia: Secondary | ICD-10-CM | POA: Diagnosis present

## 2019-05-10 DIAGNOSIS — F1410 Cocaine abuse, uncomplicated: Secondary | ICD-10-CM | POA: Diagnosis present

## 2019-05-10 DIAGNOSIS — Z79899 Other long term (current) drug therapy: Secondary | ICD-10-CM | POA: Diagnosis not present

## 2019-05-10 DIAGNOSIS — I5020 Unspecified systolic (congestive) heart failure: Secondary | ICD-10-CM

## 2019-05-10 DIAGNOSIS — Z20828 Contact with and (suspected) exposure to other viral communicable diseases: Secondary | ICD-10-CM | POA: Diagnosis present

## 2019-05-10 DIAGNOSIS — I712 Thoracic aortic aneurysm, without rupture, unspecified: Secondary | ICD-10-CM

## 2019-05-10 DIAGNOSIS — J449 Chronic obstructive pulmonary disease, unspecified: Secondary | ICD-10-CM | POA: Diagnosis present

## 2019-05-10 DIAGNOSIS — Z7982 Long term (current) use of aspirin: Secondary | ICD-10-CM | POA: Diagnosis not present

## 2019-05-10 DIAGNOSIS — I5023 Acute on chronic systolic (congestive) heart failure: Secondary | ICD-10-CM

## 2019-05-10 DIAGNOSIS — E1165 Type 2 diabetes mellitus with hyperglycemia: Secondary | ICD-10-CM | POA: Diagnosis present

## 2019-05-10 DIAGNOSIS — F1110 Opioid abuse, uncomplicated: Secondary | ICD-10-CM | POA: Diagnosis present

## 2019-05-10 DIAGNOSIS — J9691 Respiratory failure, unspecified with hypoxia: Secondary | ICD-10-CM | POA: Diagnosis present

## 2019-05-10 DIAGNOSIS — Z833 Family history of diabetes mellitus: Secondary | ICD-10-CM

## 2019-05-10 DIAGNOSIS — I4891 Unspecified atrial fibrillation: Secondary | ICD-10-CM | POA: Diagnosis present

## 2019-05-10 DIAGNOSIS — F1020 Alcohol dependence, uncomplicated: Secondary | ICD-10-CM | POA: Diagnosis present

## 2019-05-10 DIAGNOSIS — M109 Gout, unspecified: Secondary | ICD-10-CM | POA: Diagnosis present

## 2019-05-10 DIAGNOSIS — Z9119 Patient's noncompliance with other medical treatment and regimen: Secondary | ICD-10-CM

## 2019-05-10 DIAGNOSIS — K219 Gastro-esophageal reflux disease without esophagitis: Secondary | ICD-10-CM | POA: Diagnosis present

## 2019-05-10 DIAGNOSIS — Z9049 Acquired absence of other specified parts of digestive tract: Secondary | ICD-10-CM | POA: Diagnosis not present

## 2019-05-10 DIAGNOSIS — R0602 Shortness of breath: Secondary | ICD-10-CM | POA: Diagnosis not present

## 2019-05-10 DIAGNOSIS — Z90710 Acquired absence of both cervix and uterus: Secondary | ICD-10-CM | POA: Diagnosis not present

## 2019-05-10 DIAGNOSIS — F1910 Other psychoactive substance abuse, uncomplicated: Secondary | ICD-10-CM

## 2019-05-10 DIAGNOSIS — I169 Hypertensive crisis, unspecified: Secondary | ICD-10-CM | POA: Diagnosis present

## 2019-05-10 DIAGNOSIS — Z87891 Personal history of nicotine dependence: Secondary | ICD-10-CM | POA: Diagnosis not present

## 2019-05-10 DIAGNOSIS — I509 Heart failure, unspecified: Secondary | ICD-10-CM

## 2019-05-10 DIAGNOSIS — I110 Hypertensive heart disease with heart failure: Secondary | ICD-10-CM | POA: Diagnosis present

## 2019-05-10 LAB — TSH: TSH: 1.939 u[IU]/mL (ref 0.350–4.500)

## 2019-05-10 LAB — CBC WITH DIFFERENTIAL/PLATELET
Abs Immature Granulocytes: 0 10*3/uL (ref 0.00–0.07)
Band Neutrophils: 0 %
Basophils Absolute: 0 10*3/uL (ref 0.0–0.1)
Basophils Relative: 0 %
Blasts: 0 %
Eosinophils Absolute: 0.1 10*3/uL (ref 0.0–0.5)
Eosinophils Relative: 1 %
HCT: 41.6 % (ref 36.0–46.0)
Hemoglobin: 13.1 g/dL (ref 12.0–15.0)
Lymphocytes Relative: 31 %
Lymphs Abs: 2.4 10*3/uL (ref 0.7–4.0)
MCH: 27.7 pg (ref 26.0–34.0)
MCHC: 31.5 g/dL (ref 30.0–36.0)
MCV: 87.9 fL (ref 80.0–100.0)
Metamyelocytes Relative: 0 %
Monocytes Absolute: 0.5 10*3/uL (ref 0.1–1.0)
Monocytes Relative: 7 %
Myelocytes: 0 %
Neutro Abs: 4.7 10*3/uL (ref 1.7–7.7)
Neutrophils Relative %: 61 %
Other: 0 %
Platelets: 271 10*3/uL (ref 150–400)
Promyelocytes Relative: 0 %
RBC: 4.73 MIL/uL (ref 3.87–5.11)
RDW: 13.2 % (ref 11.5–15.5)
WBC: 7.7 10*3/uL (ref 4.0–10.5)
nRBC: 0 % (ref 0.0–0.2)
nRBC: 0 /100 WBC

## 2019-05-10 LAB — RAPID URINE DRUG SCREEN, HOSP PERFORMED
Amphetamines: NOT DETECTED
Barbiturates: NOT DETECTED
Benzodiazepines: NOT DETECTED
Cocaine: NOT DETECTED
Opiates: POSITIVE — AB
Tetrahydrocannabinol: NOT DETECTED

## 2019-05-10 LAB — COMPREHENSIVE METABOLIC PANEL
ALT: 18 U/L (ref 0–44)
AST: 18 U/L (ref 15–41)
Albumin: 3.6 g/dL (ref 3.5–5.0)
Alkaline Phosphatase: 117 U/L (ref 38–126)
Anion gap: 10 (ref 5–15)
BUN: 13 mg/dL (ref 6–20)
CO2: 22 mmol/L (ref 22–32)
Calcium: 8.8 mg/dL — ABNORMAL LOW (ref 8.9–10.3)
Chloride: 105 mmol/L (ref 98–111)
Creatinine, Ser: 0.79 mg/dL (ref 0.44–1.00)
GFR calc Af Amer: >60 mL/min (ref >60)
GFR calc non Af Amer: >60 mL/min (ref >60)
Glucose, Bld: 268 mg/dL — ABNORMAL HIGH (ref 70–99)
Potassium: 4 mmol/L (ref 3.5–5.1)
Sodium: 137 mmol/L (ref 135–145)
Total Bilirubin: 0.8 mg/dL (ref 0.3–1.2)
Total Protein: 6.9 g/dL (ref 6.5–8.1)

## 2019-05-10 LAB — SARS CORONAVIRUS 2 BY RT PCR (HOSPITAL ORDER, PERFORMED IN ~~LOC~~ HOSPITAL LAB): SARS Coronavirus 2: NEGATIVE

## 2019-05-10 LAB — TROPONIN I (HIGH SENSITIVITY)
Troponin I (High Sensitivity): 18 ng/L — ABNORMAL HIGH (ref <18)
Troponin I (High Sensitivity): 17 ng/L (ref <18)
Troponin I (High Sensitivity): 19 ng/L — ABNORMAL HIGH (ref <18)

## 2019-05-10 LAB — BRAIN NATRIURETIC PEPTIDE: B Natriuretic Peptide: 903 pg/mL — ABNORMAL HIGH (ref 0.0–100.0)

## 2019-05-10 LAB — GLUCOSE, CAPILLARY
Glucose-Capillary: 367 mg/dL — ABNORMAL HIGH (ref 70–99)
Glucose-Capillary: 328 mg/dL — ABNORMAL HIGH (ref 70–99)

## 2019-05-10 LAB — HEMOGLOBIN A1C
Hgb A1c MFr Bld: 12 % — ABNORMAL HIGH (ref 4.8–5.6)
Mean Plasma Glucose: 297.7 mg/dL

## 2019-05-10 IMAGING — CR DG CHEST 1V PORT
1 series · 1 of 1 positions shown · non-contrast
Comparison: [DATE]

CLINICAL DATA: Shortness of breath

EXAM:
PORTABLE CHEST 1 VIEW

[portable]
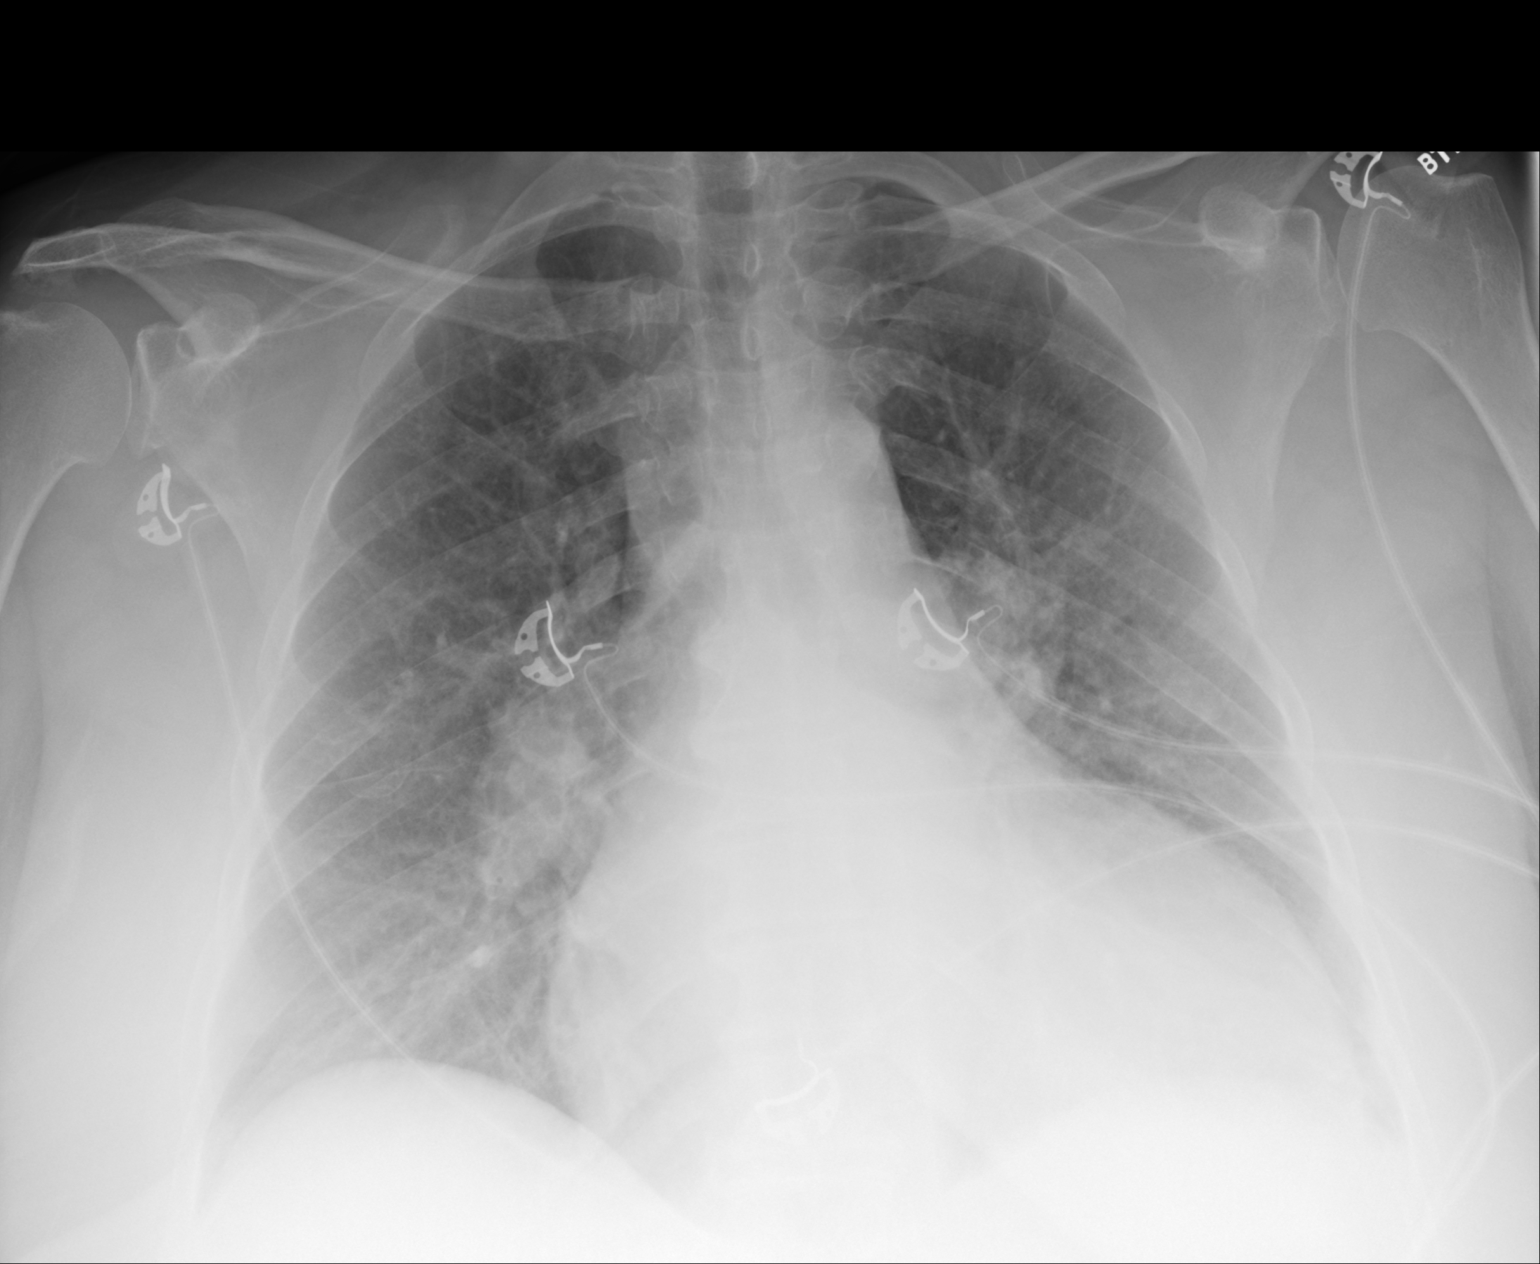

[1 of 1 positions shown; findings below may reference images not displayed]

FINDINGS: Heart size is enlarged. There are prominent interstitial lung
markings bilaterally. No pneumothorax. Pulmonary arteries appear to
be dilated which can be seen in patients with elevated pulmonary
artery pressures. There are small bilateral pleural effusions. There
is no acute osseous abnormality.
IMPRESSION: 1. Cardiomegaly with findings consistent with congestive heart
failure/volume overload.
2. Dilated pulmonary arteries which can be seen in patients with
elevated pulmonary artery pressures. This is similar to recent prior
CT dated [DATE] given differences in technique.

## 2019-05-10 MED ORDER — MORPHINE SULFATE (PF) 2 MG/ML IV SOLN
2.0000 mg | Freq: Once | INTRAVENOUS | Status: AC
  Administered 2019-05-10: 2 mg via INTRAVENOUS
  Filled 2019-05-10: qty 1

## 2019-05-10 MED ORDER — POTASSIUM CHLORIDE CRYS ER 20 MEQ PO TBCR
20.0000 meq | EXTENDED_RELEASE_TABLET | Freq: Two times a day (BID) | ORAL | Status: DC
  Administered 2019-05-10 – 2019-05-14 (×8): 20 meq via ORAL
  Filled 2019-05-10 (×9): qty 1

## 2019-05-10 MED ORDER — INSULIN ASPART 100 UNIT/ML ~~LOC~~ SOLN
0.0000 [IU] | Freq: Three times a day (TID) | SUBCUTANEOUS | Status: DC
  Administered 2019-05-10: 9 [IU] via SUBCUTANEOUS
  Administered 2019-05-11: 2 [IU] via SUBCUTANEOUS
  Administered 2019-05-11 (×2): 5 [IU] via SUBCUTANEOUS
  Administered 2019-05-12: 2 [IU] via SUBCUTANEOUS
  Administered 2019-05-12: 5 [IU] via SUBCUTANEOUS
  Administered 2019-05-12: 3 [IU] via SUBCUTANEOUS
  Administered 2019-05-13: 2 [IU] via SUBCUTANEOUS
  Administered 2019-05-13: 3 [IU] via SUBCUTANEOUS
  Administered 2019-05-13: 7 [IU] via SUBCUTANEOUS
  Administered 2019-05-14: 3 [IU] via SUBCUTANEOUS
  Administered 2019-05-14: 5 [IU] via SUBCUTANEOUS

## 2019-05-10 MED ORDER — GABAPENTIN 100 MG PO CAPS
100.0000 mg | ORAL_CAPSULE | Freq: Three times a day (TID) | ORAL | Status: DC
  Administered 2019-05-10 – 2019-05-14 (×12): 100 mg via ORAL
  Filled 2019-05-10 (×12): qty 1

## 2019-05-10 MED ORDER — SODIUM CHLORIDE 0.9% FLUSH: 3.0000 mL | INTRAVENOUS | Status: DC | PRN

## 2019-05-10 MED ORDER — ALBUTEROL SULFATE HFA 108 (90 BASE) MCG/ACT IN AERS: 1.0000 | INHALATION_SPRAY | Freq: Four times a day (QID) | RESPIRATORY_TRACT | Status: DC | PRN

## 2019-05-10 MED ORDER — SODIUM CHLORIDE 0.9 % IV SOLN: 250.0000 mL | INTRAVENOUS | Status: DC | PRN

## 2019-05-10 MED ORDER — ACETAMINOPHEN 325 MG PO TABS
650.0000 mg | ORAL_TABLET | Freq: Four times a day (QID) | ORAL | Status: DC | PRN
  Administered 2019-05-10 – 2019-05-14 (×6): 650 mg via ORAL
  Filled 2019-05-10 (×6): qty 2

## 2019-05-10 MED ORDER — FUROSEMIDE 10 MG/ML IJ SOLN
40.0000 mg | Freq: Once | INTRAMUSCULAR | Status: AC
  Administered 2019-05-10: 40 mg via INTRAVENOUS
  Filled 2019-05-10: qty 4

## 2019-05-10 MED ORDER — CARVEDILOL 12.5 MG PO TABS: 12.5000 mg | ORAL_TABLET | Freq: Two times a day (BID) | ORAL | Status: DC

## 2019-05-10 MED ORDER — ASPIRIN EC 81 MG PO TBEC: 81.0000 mg | DELAYED_RELEASE_TABLET | Freq: Every day | ORAL | Status: DC

## 2019-05-10 MED ORDER — NITROGLYCERIN IN D5W 200-5 MCG/ML-% IV SOLN
5.0000 ug/min | INTRAVENOUS | Status: DC
  Administered 2019-05-10: 5 ug/min via INTRAVENOUS
  Filled 2019-05-10: qty 250

## 2019-05-10 MED ORDER — ACETAMINOPHEN 650 MG RE SUPP: 650.0000 mg | Freq: Four times a day (QID) | RECTAL | Status: DC | PRN

## 2019-05-10 MED ORDER — TRAZODONE HCL 50 MG PO TABS
50.0000 mg | ORAL_TABLET | Freq: Every evening | ORAL | Status: DC | PRN
  Administered 2019-05-11 – 2019-05-12 (×2): 50 mg via ORAL
  Filled 2019-05-10 (×2): qty 1

## 2019-05-10 MED ORDER — SODIUM CHLORIDE 0.9% FLUSH
3.0000 mL | Freq: Two times a day (BID) | INTRAVENOUS | Status: DC
  Administered 2019-05-10 – 2019-05-14 (×8): 3 mL via INTRAVENOUS

## 2019-05-10 MED ORDER — ALBUTEROL SULFATE (2.5 MG/3ML) 0.083% IN NEBU: 2.5000 mg | INHALATION_SOLUTION | RESPIRATORY_TRACT | Status: DC | PRN

## 2019-05-10 MED ORDER — POLYETHYLENE GLYCOL 3350 17 G PO PACK: 17.0000 g | PACK | Freq: Every day | ORAL | Status: DC | PRN

## 2019-05-10 MED ORDER — CARVEDILOL 3.125 MG PO TABS
18.7500 mg | ORAL_TABLET | Freq: Two times a day (BID) | ORAL | Status: DC
  Filled 2019-05-10 (×3): qty 1
  Filled 2019-05-10: qty 2

## 2019-05-10 MED ORDER — CARVEDILOL 12.5 MG PO TABS
12.5000 mg | ORAL_TABLET | Freq: Two times a day (BID) | ORAL | Status: DC
  Administered 2019-05-10: 12.5 mg via ORAL
  Filled 2019-05-10 (×2): qty 1

## 2019-05-10 MED ORDER — METFORMIN HCL 500 MG PO TABS
1000.0000 mg | ORAL_TABLET | Freq: Two times a day (BID) | ORAL | Status: DC
  Administered 2019-05-10 – 2019-05-14 (×8): 1000 mg via ORAL
  Filled 2019-05-10 (×8): qty 2

## 2019-05-10 MED ORDER — APIXABAN 5 MG PO TABS
5.0000 mg | ORAL_TABLET | Freq: Two times a day (BID) | ORAL | Status: DC
  Administered 2019-05-10 – 2019-05-14 (×8): 5 mg via ORAL
  Filled 2019-05-10 (×9): qty 1

## 2019-05-10 MED ORDER — DILTIAZEM HCL 30 MG PO TABS
30.0000 mg | ORAL_TABLET | Freq: Four times a day (QID) | ORAL | Status: DC
  Administered 2019-05-10 (×2): 30 mg via ORAL
  Filled 2019-05-10 (×2): qty 1

## 2019-05-10 MED ORDER — DILTIAZEM HCL 100 MG IV SOLR
5.0000 mg/h | INTRAVENOUS | Status: AC
  Administered 2019-05-10: 5 mg/h via INTRAVENOUS
  Filled 2019-05-10: qty 100

## 2019-05-10 MED ORDER — ONDANSETRON HCL 4 MG/2ML IJ SOLN: 4.0000 mg | Freq: Four times a day (QID) | INTRAMUSCULAR | Status: DC | PRN

## 2019-05-10 MED ORDER — INSULIN ASPART 100 UNIT/ML ~~LOC~~ SOLN
0.0000 [IU] | Freq: Every day | SUBCUTANEOUS | Status: DC
  Administered 2019-05-10 – 2019-05-11 (×2): 4 [IU] via SUBCUTANEOUS
  Administered 2019-05-12 – 2019-05-13 (×2): 2 [IU] via SUBCUTANEOUS

## 2019-05-10 MED ORDER — PANTOPRAZOLE SODIUM 40 MG PO TBEC
40.0000 mg | DELAYED_RELEASE_TABLET | Freq: Every day | ORAL | Status: DC
  Administered 2019-05-10 – 2019-05-14 (×5): 40 mg via ORAL
  Filled 2019-05-10 (×6): qty 1

## 2019-05-10 MED ORDER — ALBUTEROL SULFATE (2.5 MG/3ML) 0.083% IN NEBU: 2.5000 mg | INHALATION_SOLUTION | Freq: Four times a day (QID) | RESPIRATORY_TRACT | Status: DC | PRN

## 2019-05-10 MED ORDER — INSULIN GLARGINE 100 UNIT/ML ~~LOC~~ SOLN
10.0000 [IU] | Freq: Every day | SUBCUTANEOUS | Status: DC
  Administered 2019-05-10 – 2019-05-11 (×2): 10 [IU] via SUBCUTANEOUS
  Filled 2019-05-10 (×3): qty 0.1

## 2019-05-10 MED ORDER — FUROSEMIDE 10 MG/ML IJ SOLN
40.0000 mg | Freq: Two times a day (BID) | INTRAMUSCULAR | Status: DC
  Administered 2019-05-10 – 2019-05-13 (×6): 40 mg via INTRAVENOUS
  Filled 2019-05-10 (×7): qty 4

## 2019-05-10 MED ORDER — LINACLOTIDE 145 MCG PO CAPS
145.0000 ug | ORAL_CAPSULE | Freq: Every day | ORAL | Status: DC
  Administered 2019-05-11 – 2019-05-14 (×4): 145 ug via ORAL
  Filled 2019-05-10 (×4): qty 1

## 2019-05-10 MED ORDER — ONDANSETRON HCL 4 MG PO TABS: 4.0000 mg | ORAL_TABLET | Freq: Four times a day (QID) | ORAL | Status: DC | PRN

## 2019-05-10 MED ORDER — DILTIAZEM HCL 25 MG/5ML IV SOLN
10.0000 mg | Freq: Once | INTRAVENOUS | Status: AC
  Administered 2019-05-10: 10 mg via INTRAVENOUS
  Filled 2019-05-10: qty 5

## 2019-05-10 NOTE — ED Notes (Addendum)
Per Dr. Denton Brick, keep cardizem at 5mg  continuous drip/dc nitro. Read back and verified.

## 2019-05-10 NOTE — ED Notes (Signed)
Per Dr. Denton Brick. GIve oral coreg and oral cardizem, after one hour stop cardizem drip.
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
## 2019-05-10 NOTE — H&P (Signed)
Patient Demographics:    Robin [name], is a 53 y.o. female  MRN: [id number]   DOB - 08-05-1966  Admit Date - 05/10/2019  Outpatient Primary MD for the patient is Inda Coke, [place]   Assessment & Plan:    Principal Problem:   Acute exacerbation of CHF (congestive heart failure) ([place]) Active Problems:   Respiratory failure with hypoxia (HCC)   COPD (chronic obstructive pulmonary disease) (HCC)   Atrial fibrillation with RVR (HCC)   1)HFrEF--last known EF 30  to 35%, suspect cardiomyopathy due to cocaine/substance abuse, CHF flareup at this time is due to A. fib with RVR -BNP is now 903 which is way higher than her previous baseline and chest x-ray consistent with CHF -Diuresed with IV Lasix, daily weights and fluid input and output monitoring  2) chronic Afib now with RVR- Initially started on IV Cardizem drip with good rate control however patient's EF is 30 to 35% so given negative UDS for cocaine would prefer beta-blocker over CCB for rate control at this time. -Eliquis for stroke prophylaxis as previously recommended but patient has not been compliant -Coreg titrated to 18.75 mg twice daily -TSH is 1.9  3) atypical chest pain--due to #2 above, serial troponins negative so far.   4)DM2-last A1c 11.3, reflecting uncontrolled diabetic due to noncompliance.-PTA patient was on 70/30 insulin, use Lantus insulin 10 units nightly along with sliding scale coverage -Continue metformin  5) history of polysubstance abuse including cocaine--UDS this time positive only for opiates negative for cocaine this time around   With History of - Reviewed by me  Past Medical History:  Diagnosis Date   Allergy    Anemia    Arthritis    CHF (congestive heart failure) (HCC)    a. EF at 30-35% by echo in 08/2018    Chronic abdominal pain    COPD (chronic obstructive pulmonary disease) ([place])    Drug use    Essential hypertension, benign    GERD (gastroesophageal reflux disease)    Gout 2016   Normal coronary arteries    3/10 - following abnormal Myoview   Ovarian cyst    Type 2 diabetes mellitus ([place])       Past Surgical History:  Procedure Laterality Date   ABDOMINAL HYSTERECTOMY  09/10/2011   Procedure: HYSTERECTOMY ABDOMINAL;  Surgeon: Jonnie Kind, MD;  Location: [facility];  Service: Gynecology;  Laterality: N/A;  Abdominal hysterectomy   CESAREAN SECTION  [id number], and [place]  09/10/2011   Procedure: SCAR REVISION;  Surgeon: Jonnie Kind, MD;  Location: [facility];  Service: Gynecology;  Laterality: N/A;  Wide Excision of old [place]     Chief Complaint  Patient presents with   Chest Pain      HPI:    Robin [name]  is a 53 y.o. female  with a hx of HFrEF w/  last EF 30-35%, COPD, HTN, T2DM, GERD, cocaine abuse, & chronic abdominal pain w/ prior hysterectomy, cholecystectomy, & c-section as well as history of polysubstance abuse including cocaine presents to ED with chest discomfort and shortness of breath.   -Chest discomfort was not exertional, patient has palpitations and dizziness as well, no pleuritic symptoms, -No fevers no chills, no productive cough -She is found to be in A. fib with RVR -Initially started on IV Cardizem drip with good rate control however patient's EF is 30 to 35% so given negative UDS for cocaine would prefer beta-blocker for rate control at this time   -Patient reports dyspnea on exertion and orthopnea PTA  In ED--- BNP is elevated at 903 which is higher than her previous baseline, chest x-ray consistent with CHF, creatinine 0.79, TSH is 1.9 and troponins are not elevated     Review of systems:    In addition to the HPI above,   A full Review of  Systems was done, all other  systems reviewed are negative except as noted above in HPI , .    Social History:  Reviewed by me    Social History   Tobacco Use   Smoking status: Former Smoker    Packs/day: 0.25    Years: 29.00    Pack years: 7.25    Types: Cigarettes   Smokeless tobacco: Never Used   Tobacco comment: 1 cigarette every 3 days  Substance Use Topics   Alcohol use: No       Family History :  Reviewed by me    Family History  Problem Relation Age of Onset   Cirrhosis Mother    Early death Mother    Alcohol abuse Mother    Diabetes type II Father    Alcohol abuse Father    Diabetes type II Sister    Early death Brother    Alcohol abuse Son    Anesthesia problems Neg Hx    Hypotension Neg Hx    Malignant hyperthermia Neg Hx    Pseudochol deficiency Neg Hx    Colon cancer Neg Hx    Colon polyps Neg Hx    Esophageal cancer Neg Hx    Rectal cancer Neg Hx    Stomach cancer Neg Hx      Home Medications:   Prior to Admission medications   Medication Sig Start Date End Date Taking? Authorizing Provider  acetaminophen (TYLENOL 8 HOUR) 650 MG CR tablet Take 1 tablet (650 mg total) by mouth every 8 (eight) hours. Patient taking differently: Take 1,300 mg by mouth 3 (three) times daily as needed (pain.).  07/26/18  Yes Varney Biles, MD  albuterol (PROVENTIL HFA;VENTOLIN HFA) 108 (90 Base) MCG/ACT inhaler Inhale 1-2 puffs into the lungs every 6 (six) hours as needed for wheezing or shortness of breath. 08/25/18  Yes Tat, Shanon Brow, MD  albuterol (PROVENTIL) (2.5 MG/3ML) 0.083% nebulizer solution Take 3 mLs (2.5 mg total) by nebulization every 6 (six) hours as needed for wheezing or shortness of breath. 03/15/19  Yes Inda Coke, PA  aspirin EC 81 MG tablet Take 81 mg by mouth daily.   Yes [provider]  carvedilol (COREG) 6.25 MG tablet Take 2 tablets (12.5 mg total) by mouth 2 (two) times daily with a meal. 04/19/19  Yes Petrucelli, Samantha R, PA-C   furosemide (LASIX) 20 MG tablet Take 1 tablet (20 mg total) by mouth 2 (two) times daily. 09/18/18  Yes Strader, [place], PA-C  gabapentin (NEURONTIN) 100 MG  capsule Take 1 capsule (100 mg total) by mouth 3 (three) times daily. 01/06/19  Yes Inda Coke, PA  ibuprofen (ADVIL,MOTRIN) 200 MG tablet Take 400 mg by mouth every 8 (eight) hours as needed (neuropathy/nerve pain.).   Yes [provider]  insulin aspart protamine- aspart (NOVOLOG MIX 70/30) (70-30) 100 UNIT/ML injection Take 15 units 3 times a day with meals Patient taking differently: Inject 15 Units into the skin 3 (three) times daily with meals.  09/21/18  Yes Inda Coke, PA  metFORMIN (GLUCOPHAGE) 1000 MG tablet Take 1 tablet (1,000 mg total) by mouth 2 (two) times daily with a meal. 09/21/18  Yes Inda Coke, PA  omeprazole (PRILOSEC) 40 MG capsule Take 1 capsule by mouth twice daily 03/05/19  Yes Worley, [place], PA  potassium chloride SA (K-DUR,KLOR-CON) 20 MEQ tablet Take 1 tablet (20 mEq total) by mouth 2 (two) times daily. 09/18/18  Yes Strader, [place] M, PA-C  apixaban (ELIQUIS) 5 MG TABS tablet Take 1 tablet (5 mg total) by mouth 2 (two) times daily. Patient not taking: Reported on 05/10/2019 04/19/19 05/19/19  Petrucelli, Glynda Jaeger, PA-C  linaclotide [facility]) 145 MCG CAPS capsule Take 1 capsule (145 mcg total) by mouth daily before breakfast. Patient not taking: Reported on 05/10/2019 11/11/18   Inda Coke, PA     Allergies:     Allergies  Allergen Reactions   Bee Venom Shortness Of Breath and Swelling    Bodily Swelling   Losartan Other (See Comments)    Nosebleeds per patient report.    Naproxen Other (See Comments)    Effects acid reflux   Penicillins Nausea Only    Has patient had a PCN reaction causing immediate rash, facial/tongue/throat swelling, SOB or lightheadedness with hypotension: no Has patient had a PCN reaction causing severe rash involving mucus membranes or skin  necrosis: no Has patient had a PCN reaction that required hospitalization no Has patient had a PCN reaction occurring within the last 10 years: no If all of the above answers are "NO", then may proceed with Cephalosporin      Physical Exam:   Vitals  Blood pressure 131/90, pulse 83, temperature 98.5 F (36.9 C), temperature source Oral, resp. rate (!) 26, height 5\' 6"  (1.676 m), weight 99.3 kg, last menstrual period 08/21/2011, SpO2 98 %.  Physical Examination: General appearance - alert, well appearing, mild conversational dyspnea  mental status - alert, oriented to [place], place, and time,  Nose- [place] 2 L/min Eyes - sclera anicteric Neck - supple, no JVD elevation , Chest -diminished in bases with scattered rales  heart - S1 and S2 normal, irregularly irregular  abdomen - soft, nontender, nondistended, no masses or organomegaly Neurological - screening mental status exam normal, neck supple without rigidity, cranial nerves II through XII intact, DTR's normal and symmetric Extremities - no pedal edema noted, intact peripheral pulses  Skin - warm, dry     Data Review:    CBC Recent Labs  Lab 05/10/19 0848  WBC 7.7  HGB 13.1  HCT 41.6  PLT 271  MCV 87.9  MCH 27.7  MCHC 31.5  RDW 13.2  LYMPHSABS 2.4  MONOABS 0.5  EOSABS 0.1  BASOSABS 0.0   ------------------------------------------------------------------------------------------------------------------  Chemistries  Recent Labs  Lab 05/10/19 0848  NA 137  K 4.0  CL 105  CO2 22  GLUCOSE 268*  BUN 13  CREATININE 0.79  CALCIUM 8.8*  AST 18  ALT 18  ALKPHOS 117  BILITOT 0.8   ------------------------------------------------------------------------------------------------------------------ estimated  creatinine clearance is 97.8 mL/min (by C-G formula based on SCr of 0.79 mg/dL). ------------------------------------------------------------------------------------------------------------------ Recent Labs     05/10/19 1536  TSH 1.939     Coagulation profile No results for input(s): INR, PROTIME in the last 168 hours. ------------------------------------------------------------------------------------------------------------------- No results for input(s): DDIMER in the last 72 hours. -------------------------------------------------------------------------------------------------------------------  Cardiac Enzymes No results for input(s): CKMB, TROPONINI, MYOGLOBIN in the last 168 hours.  Invalid input(s): CK ------------------------------------------------------------------------------------------------------------------    Component Value Date/Time   BNP 903.0 (H) 05/10/2019 0848     ---------------------------------------------------------------------------------------------------------------  Urinalysis    Component Value Date/Time   COLORURINE YELLOW 04/19/2019 1220   APPEARANCEUR CLEAR 04/19/2019 1220   LABSPEC 1.025 04/19/2019 1220   PHURINE 6.0 04/19/2019 1220   GLUCOSEU >=500 (A) 04/19/2019 1220   GLUCOSEU >=1000 (A) 09/21/2018 0829   HGBUR NEGATIVE 04/19/2019 1220   BILIRUBINUR NEGATIVE 04/19/2019 1220   KETONESUR NEGATIVE 04/19/2019 1220   PROTEINUR NEGATIVE 04/19/2019 1220   UROBILINOGEN 0.2 09/21/2018 0829   NITRITE NEGATIVE 04/19/2019 1220   LEUKOCYTESUR NEGATIVE 04/19/2019 1220    ----------------------------------------------------------------------------------------------------------------   Imaging Results:    Dg Chest Portable 1 View  Result Date: 05/10/2019 CLINICAL DATA:  Shortness of breath EXAM: PORTABLE CHEST 1 VIEW COMPARISON:  04/19/2019 FINDINGS: Heart size is enlarged. There are prominent interstitial lung markings bilaterally. No pneumothorax. Pulmonary arteries appear to be dilated which can be seen in patients with elevated pulmonary artery pressures. There are small bilateral pleural effusions. There is no acute osseous abnormality.  IMPRESSION: 1. Cardiomegaly with findings consistent with congestive heart failure/volume overload. 2. Dilated pulmonary arteries which can be seen in patients with elevated pulmonary artery pressures. This is similar to recent prior CT dated 04/19/2019 given differences in technique. Electronically Signed   By: Constance Holster M.D.   On: 05/10/2019 09:14    Radiological Exams on Admission: Dg Chest Portable 1 View  Result Date: 05/10/2019 CLINICAL DATA:  Shortness of breath EXAM: PORTABLE CHEST 1 VIEW COMPARISON:  04/19/2019 FINDINGS: Heart size is enlarged. There are prominent interstitial lung markings bilaterally. No pneumothorax. Pulmonary arteries appear to be dilated which can be seen in patients with elevated pulmonary artery pressures. There are small bilateral pleural effusions. There is no acute osseous abnormality. IMPRESSION: 1. Cardiomegaly with findings consistent with congestive heart failure/volume overload. 2. Dilated pulmonary arteries which can be seen in patients with elevated pulmonary artery pressures. This is similar to recent prior CT dated 04/19/2019 given differences in technique. Electronically Signed   By: Constance Holster M.D.   On: 05/10/2019 09:14   DVT Prophylaxis -SCD  /eliquis AM Labs Ordered, also please review Full Orders  Family Communication: Admission, patients condition and plan of care including tests being ordered have been discussed with the patient  who indicate understanding and agree with the plan   Code Status - Full Code  Likely DC to  home  Condition   Stable  Roxan Hockey M.D on 05/10/2019 at 6:37 PM Go to [email or web address] -  for contact info  [facility] - Office  [phone]

## 2019-05-10 NOTE — ED Triage Notes (Signed)
Pt complains of abdominal pain radiate to left started early yesterday. Pain now has radiated to chest this morning. Pt denies n/v/d. Pt denies doing any strenuous activity when pain started. Pt states she has a history of aneurysm and afib.

## 2019-05-11 ENCOUNTER — Ambulatory Visit: Payer: PRIVATE HEALTH INSURANCE | Admitting: Student

## 2019-05-11 ENCOUNTER — Inpatient Hospital Stay (HOSPITAL_COMMUNITY): Payer: PRIVATE HEALTH INSURANCE

## 2019-05-11 DIAGNOSIS — R0789 Other chest pain: Secondary | ICD-10-CM

## 2019-05-11 DIAGNOSIS — I340 Nonrheumatic mitral (valve) insufficiency: Secondary | ICD-10-CM

## 2019-05-11 DIAGNOSIS — I712 Thoracic aortic aneurysm, without rupture: Secondary | ICD-10-CM

## 2019-05-11 DIAGNOSIS — I4891 Unspecified atrial fibrillation: Secondary | ICD-10-CM

## 2019-05-11 DIAGNOSIS — F1910 Other psychoactive substance abuse, uncomplicated: Secondary | ICD-10-CM

## 2019-05-11 DIAGNOSIS — J449 Chronic obstructive pulmonary disease, unspecified: Secondary | ICD-10-CM

## 2019-05-11 DIAGNOSIS — I361 Nonrheumatic tricuspid (valve) insufficiency: Secondary | ICD-10-CM

## 2019-05-11 DIAGNOSIS — I5023 Acute on chronic systolic (congestive) heart failure: Secondary | ICD-10-CM

## 2019-05-11 LAB — GLUCOSE, CAPILLARY
Glucose-Capillary: 295 mg/dL — ABNORMAL HIGH (ref 70–99)
Glucose-Capillary: 281 mg/dL — ABNORMAL HIGH (ref 70–99)
Glucose-Capillary: 194 mg/dL — ABNORMAL HIGH (ref 70–99)
Glucose-Capillary: 340 mg/dL — ABNORMAL HIGH (ref 70–99)

## 2019-05-11 LAB — CBC
HCT: 40 % (ref 36.0–46.0)
Hemoglobin: 12.6 g/dL (ref 12.0–15.0)
MCH: 27.8 pg (ref 26.0–34.0)
MCHC: 31.5 g/dL (ref 30.0–36.0)
MCV: 88.1 fL (ref 80.0–100.0)
Platelets: 238 10*3/uL (ref 150–400)
RBC: 4.54 MIL/uL (ref 3.87–5.11)
RDW: 13.1 % (ref 11.5–15.5)
WBC: 6.7 10*3/uL (ref 4.0–10.5)
nRBC: 0 % (ref 0.0–0.2)

## 2019-05-11 LAB — BASIC METABOLIC PANEL
Anion gap: 7 (ref 5–15)
BUN: 15 mg/dL (ref 6–20)
CO2: 27 mmol/L (ref 22–32)
Calcium: 9.3 mg/dL (ref 8.9–10.3)
Chloride: 102 mmol/L (ref 98–111)
Creatinine, Ser: 0.84 mg/dL (ref 0.44–1.00)
GFR calc Af Amer: >60 mL/min (ref >60)
GFR calc non Af Amer: >60 mL/min (ref >60)
Glucose, Bld: 302 mg/dL — ABNORMAL HIGH (ref 70–99)
Potassium: 3.7 mmol/L (ref 3.5–5.1)
Sodium: 136 mmol/L (ref 135–145)

## 2019-05-11 LAB — MAGNESIUM: Magnesium: 2.1 mg/dL (ref 1.7–2.4)

## 2019-05-11 LAB — ECHOCARDIOGRAM COMPLETE
Height: 66 in
Weight: 3467.2 oz

## 2019-05-11 MED ORDER — CARVEDILOL 12.5 MG PO TABS
25.0000 mg | ORAL_TABLET | Freq: Two times a day (BID) | ORAL | Status: DC
  Administered 2019-05-11 – 2019-05-12 (×4): 25 mg via ORAL
  Filled 2019-05-11 (×4): qty 2

## 2019-05-11 MED ORDER — METOPROLOL TARTRATE 5 MG/5ML IV SOLN
5.0000 mg | Freq: Once | INTRAVENOUS | Status: AC
  Administered 2019-05-11: 5 mg via INTRAVENOUS
  Filled 2019-05-11: qty 5

## 2019-05-11 MED ORDER — METOPROLOL TARTRATE 5 MG/5ML IV SOLN: 5.0000 mg | Freq: Once | INTRAVENOUS | Status: DC

## 2019-05-11 MED ORDER — MAGNESIUM SULFATE 2 GM/50ML IV SOLN
2.0000 g | Freq: Once | INTRAVENOUS | Status: AC
  Administered 2019-05-11: 2 g via INTRAVENOUS
  Filled 2019-05-11: qty 50

## 2019-05-11 MED ORDER — CARVEDILOL 12.5 MG PO TABS: 25.0000 mg | ORAL_TABLET | Freq: Two times a day (BID) | ORAL | Status: DC

## 2019-05-11 MED ORDER — ALBUTEROL SULFATE (2.5 MG/3ML) 0.083% IN NEBU: 2.5000 mg | INHALATION_SOLUTION | RESPIRATORY_TRACT | Status: DC | PRN

## 2019-05-11 NOTE — Consult Note (Addendum)
Cardiology Consult    Patient ID: Robin [name]; [id number]; 06/20/1966   Admit date: 05/10/2019 Date of Consult: 05/11/2019  Primary Care Provider: Inda [name], [place] Primary Cardiologist: Robin Dolly, MD   Patient Profile    Robin [name] is a 53 y.o. female with past medical history of chronic systolic CHF (EF at [id number] by echo in 08/2018), HTN, COPD,uncontrolled IDDM, thoracic aortic aneurysm (at 4.0 cm by imaging in 04/2019), history of normal cors by cath in 2010, and prior substance abuse who is being seen today for the evaluation of atrial fibrillation with RVR at the request of Dr. Malachi [name].   History of Present Illness    Robin [name] was last examined by myself in 10/2018 and denied any recent chest pain or dyspnea on exertion. She was tolerating Coreg 6.25mg  BID and Lisinopril 5mg  daily. Refused to be on Entresto given history of "epistaxis" with Losartan in the past. Weight was stable at 215 lbs and she was continued on Lasix 20mg  daily. Was scheduled for a repeat echo in 10/2018 but this was postponed due to Robin [name]. Has not followed-up with Cardiology since.   In the interim, she was evaluated at [facility] ED on 04/19/2019 for abdominal pain which radiated into her chest and was found to be in new-onset atrial fibrillation. Carvedilol was titrated to 12.5 mg twice daily and ASA was discontinued with her being started on Eliquis 5 mg twice daily. Actually had scheduled follow-up in the office today.   She presented to [facility] ED on 05/10/2019 for evaluation of recurrent abdominal pain. She describes this is a shooting pain under her left breast which can last for seconds to minutes at a time. Is most noticeable at rest. She reports that her breathing has overall been at baseline and denies any specific orthopnea, PND, or lower extremity edema.  She had previously self discontinued Coreg prior to her ED evaluation on 04/19/2019 and says she had only been taking ASA and Lasix  from a cardiac perspective.  Self discontinued Lisinopril due to this causing "sinus congestion". She does report being under increased stress as she currently works 2 jobs and her son lives with her and she reports he continues to have behavioral issues secondary to alcoholism. He has suffered a lower extremity amputation and uses a prosthetic.  Initial labs showed WBC 7.7, Hgb 13.1, platelets 271, Na+ 137, K+ 4.0, and creatinine 0.79. BNP 903. Initial HS Troponin 18 with repeat value of 17. UDS positive for Opiates (positive for Opiates and Cocaine in 04/2019). Hgb A1c 12.0. TSH 1.939. COVID negative. Mg 2.1. CXR showed cardiomegaly and evidence of fluid overload along with dilated pulmonary arteries. EKG shows rate-controlled atrial fibrillation, HR 86.  She was started on IV Lasix 40 mg twice daily upon admission.  Carvedilol was titrated to 18.75mg  BID on 10/5 but further titrated to 25mg  BID this AM given issues with atrial fibrillation with RVR overnight.  She reports having self discontinued Eliquis last week due to issues with hematuria.   Past Medical History:  Diagnosis Date  . Allergy   . Anemia   . Arthritis   . CHF (congestive heart failure) ([place])    a. EF at 30-35% by echo in 08/2018  . Chronic abdominal pain   . COPD (chronic obstructive pulmonary disease) ([place])   . Drug use   . Essential hypertension, benign   . GERD (gastroesophageal reflux disease)   . Gout 2016  . Normal coronary arteries  3/10 - following abnormal Myoview  . Ovarian cyst   . Type 2 diabetes mellitus ([place])     Past Surgical History:  Procedure Laterality Date  . ABDOMINAL HYSTERECTOMY  09/10/2011   Procedure: HYSTERECTOMY ABDOMINAL;  Surgeon: Robin Kind, MD;  Location: [facility];  Service: Gynecology;  Laterality: N/A;  Abdominal hysterectomy  . CESAREAN SECTION  [id number], and 1994  . CHOLECYSTECTOMY  1995  . SCAR REVISION  09/10/2011   Procedure: SCAR REVISION;  Surgeon: Robin Kind, MD;   Location: [facility];  Service: Gynecology;  Laterality: N/A;  Wide Excision of old Cicatrix  . TUBAL LIGATION  1994     Home Medications:  Prior to Admission medications   Medication Sig Start Date End Date Taking? Authorizing Provider  acetaminophen (TYLENOL 8 HOUR) 650 MG CR tablet Take 1 tablet (650 mg total) by mouth every 8 (eight) hours. Patient taking differently: Take 1,300 mg by mouth 3 (three) times daily as needed (pain.).  07/26/18  Yes Robin Biles, MD  albuterol (PROVENTIL HFA;VENTOLIN HFA) 108 (90 Base) MCG/ACT inhaler Inhale 1-2 puffs into the lungs every 6 (six) hours as needed for wheezing or shortness of breath. 08/25/18  Yes [name], Robin Brow, MD  albuterol (PROVENTIL) (2.5 MG/3ML) 0.083% nebulizer solution Take 3 mLs (2.5 mg total) by nebulization every 6 (six) hours as needed for wheezing or shortness of breath. 03/15/19  Yes Robin Coke, PA  aspirin EC 81 MG tablet Take 81 mg by mouth daily.   Yes [provider]  carvedilol (COREG) 6.25 MG tablet Take 2 tablets (12.5 mg total) by mouth 2 (two) times daily with a meal. 04/19/19  Yes [name], Robin R, PA-C  furosemide (LASIX) 20 MG tablet Take 1 tablet (20 mg total) by mouth 2 (two) times daily. 09/18/18  Yes [name], Robin M, PA-C  gabapentin (NEURONTIN) 100 MG capsule Take 1 capsule (100 mg total) by mouth 3 (three) times daily. 01/06/19  Yes Robin Coke, PA  ibuprofen (ADVIL,MOTRIN) 200 MG tablet Take 400 mg by mouth every 8 (eight) hours as needed (neuropathy/nerve pain.).   Yes [provider]  insulin aspart protamine- aspart (NOVOLOG MIX 70/30) (70-30) 100 UNIT/ML injection Take 15 units 3 times a day with meals Patient taking differently: Inject 15 Units into the skin 3 (three) times daily with meals.  09/21/18  Yes Robin Coke, PA  metFORMIN (GLUCOPHAGE) 1000 MG tablet Take 1 tablet (1,000 mg total) by mouth 2 (two) times daily with a meal. 09/21/18  Yes Robin Coke, PA  omeprazole  (PRILOSEC) 40 MG capsule Take 1 capsule by mouth twice daily 03/05/19  Yes [name], [place], PA  potassium chloride SA (K-DUR,KLOR-CON) 20 MEQ tablet Take 1 tablet (20 mEq total) by mouth 2 (two) times daily. 09/18/18  Yes [name], Robin M, PA-C  apixaban (ELIQUIS) 5 MG TABS tablet Take 1 tablet (5 mg total) by mouth 2 (two) times daily. Patient not taking: Reported on 05/10/2019 04/19/19 05/19/19  [name], Glynda Jaeger, PA-C  linaclotide [facility]) 145 MCG CAPS capsule Take 1 capsule (145 mcg total) by mouth daily before breakfast. Patient not taking: Reported on 05/10/2019 11/11/18   Robin Coke, PA    Inpatient Medications: Scheduled Meds: . apixaban  5 mg Oral BID  . carvedilol  25 mg Oral BID WC  . furosemide  40 mg Intravenous BID  . gabapentin  100 mg Oral TID  . insulin aspart  0-5 Units Subcutaneous QHS  . insulin aspart  0-9 Units Subcutaneous TID  WC  . insulin glargine  10 Units Subcutaneous QHS  . linaclotide  145 mcg Oral QAC breakfast  . metFORMIN  1,000 mg Oral BID WC  . pantoprazole  40 mg Oral Daily  . potassium chloride SA  20 mEq Oral BID  . sodium chloride flush  3 mL Intravenous Q12H   Continuous Infusions: . sodium chloride     PRN Meds: sodium chloride, acetaminophen **OR** acetaminophen, albuterol, ondansetron **OR** ondansetron (ZOFRAN) IV, polyethylene glycol, sodium chloride flush, traZODone  Allergies:    Allergies  Allergen Reactions  . Bee Venom Shortness Of Breath and Swelling    Bodily Swelling  . Losartan Other (See Comments)    Nosebleeds per patient report.   . Naproxen Other (See Comments)    Effects acid reflux  . Penicillins Nausea Only    Has patient had a PCN reaction causing immediate rash, facial/tongue/throat swelling, SOB or lightheadedness with hypotension: no Has patient had a PCN reaction causing severe rash involving mucus membranes or skin necrosis: no Has patient had a PCN reaction that required hospitalization no Has  patient had a PCN reaction occurring within the last 10 years: no If all of the above answers are "NO", then may proceed with Cephalosporin     Social History:   Social History   Socioeconomic History  . Marital status: Single    Spouse name: Not on file  . Number of children: Not on file  . Years of education: Not on file  . Highest education level: Not on file  Occupational History  . Not on file  Social Needs  . Financial resource strain: Not on file  . Food insecurity    Worry: Not on file    Inability: Not on file  . Transportation needs    Medical: Not on file    Non-medical: Not on file  Tobacco Use  . Smoking status: Former Smoker    Packs/day: 0.25    Years: 29.00    Pack years: 7.25    Types: Cigarettes  . Smokeless tobacco: Never Used  . Tobacco comment: 1 cigarette every 3 days  Substance and Sexual Activity  . Alcohol use: No  . Drug use: Not Currently    Types: Cocaine, Marijuana  . Sexual activity: Yes    Birth control/protection: Surgical  Lifestyle  . Physical activity    Days per week: Not on file    Minutes per session: Not on file  . Stress: Not on file  Relationships  . Social Herbalist on phone: Not on file    Gets together: Not on file    Attends religious service: Not on file    Active member of club or organization: Not on file    Attends meetings of clubs or organizations: Not on file    Relationship status: Not on file  . Intimate partner violence    Fear of current or ex partner: Not on file    Emotionally abused: Not on file    Physically abused: Not on file    Forced sexual activity: Not on file  Other Topics Concern  . Not on file  Social History Narrative   Part-time at [employer], lives in [place], [place]   3 children    Married     Family History:    Family History  Problem Relation Age of Onset  . Cirrhosis Mother   . Early death Mother   . Alcohol abuse Mother   .  Diabetes type II Father   .  Alcohol abuse Father   . Diabetes type II Sister   . Early death Brother   . Alcohol abuse Son   . Anesthesia problems Neg Hx   . Hypotension Neg Hx   . Malignant hyperthermia Neg Hx   . Pseudochol deficiency Neg Hx   . Colon cancer Neg Hx   . Colon polyps Neg Hx   . Esophageal cancer Neg Hx   . Rectal cancer Neg Hx   . Stomach cancer Neg Hx       Review of Systems    General:  No chills, fever, night sweats or weight changes.  Cardiovascular:  No dyspnea on exertion, edema, orthopnea, palpitations, paroxysmal nocturnal dyspnea. Positive for chest pain.  Dermatological: No rash, lesions/masses Respiratory: No cough, dyspnea Urologic: No hematuria, dysuria Abdominal:   No nausea, vomiting, diarrhea, bright red blood per rectum, melena, or hematemesis. Positive for abdominal pain.  Neurologic:  No visual changes, wkns, changes in mental status. All other systems reviewed and are otherwise negative except as noted above.  Physical Exam/Data    Vitals:   05/10/19 1526 05/10/19 2157 05/11/19 0157 05/11/19 0636  BP: 131/90 119/83 (!) 153/94 (!) 142/114  Pulse: 83 66 92 97  Resp: (!) 26 16 16 20   Temp: 98.5 F (36.9 C) 98 F (36.7 C) 98.1 F (36.7 C) 98 F (36.7 C)  TempSrc: Oral Oral Oral Oral  SpO2: 98% 94% 98% 99%  Weight:    98.3 kg  Height:        Intake/Output Summary (Last 24 hours) at 05/11/2019 0923 Last data filed at 05/11/2019 0839 Gross per 24 hour  Intake 366 ml  Output 1500 ml  Net -1134 ml   Filed Weights   05/10/19 0814 05/11/19 0636  Weight: 99.3 kg 98.3 kg   Body mass index is 34.98 kg/m.   General: Pleasant female appearing in NAD Psych: Normal affect. Neuro: Alert and oriented X 3. Moves all extremities spontaneously. HEENT: Normal  Neck: Supple without bruits or JVD. Lungs:  Resp regular and unlabored, decreased along bases bilaterally. Heart: Irregularly irregular.  no s3, s4, or murmurs. Abdomen: Soft, non-tender, non-distended, BS + x  4.  Extremities: No clubbing, cyanosis or edema. DP/PT/Radials 2+ and equal bilaterally.   EKG:  The EKG was personally reviewed and demonstrates:  Rate-controlled atrial fibrillation, HR 86.  Telemetry:  Telemetry was personally reviewed and demonstrates: Atrial fibrillation, HR mostly in the 90's to low-100's, peaking into 140's overnight.    Labs/Studies     Relevant CV Studies:  Echocardiogram: 08/2018 Study Conclusions  - Left ventricle: Diffuse hypokinesis worse in the inferior basal   and posterior lateral walls. The cavity size was mildly dilated.   Wall thickness was increased in a pattern of mild LVH. Systolic   function was moderately to severely reduced. The estimated   ejection fraction was in the range of 30% to 35%. Doppler   parameters are consistent with both elevated ventricular   end-diastolic filling pressure and elevated left atrial filling   pressure. - Aortic valve: There was mild stenosis. - Left atrium: The atrium was moderately dilated. - Atrial septum: No defect or patent foramen ovale was identified.  Laboratory Data:  Chemistry Recent Labs  Lab 05/10/19 0848 05/11/19 0530  NA 137 136  K 4.0 3.7  CL 105 102  CO2 22 27  GLUCOSE 268* 302*  BUN 13 15  CREATININE 0.79 0.84  CALCIUM 8.8*  9.3  GFRNONAA >60 >60  GFRAA >60 >60  ANIONGAP 10 7    Recent Labs  Lab 05/10/19 0848  PROT 6.9  ALBUMIN 3.6  AST 18  ALT 18  ALKPHOS 117  BILITOT 0.8   Hematology Recent Labs  Lab 05/10/19 0848 05/11/19 0530  WBC 7.7 6.7  RBC 4.73 4.54  HGB 13.1 12.6  HCT 41.6 40.0  MCV 87.9 88.1  MCH 27.7 27.8  MCHC 31.5 31.5  RDW 13.2 13.1  PLT 271 238   Cardiac EnzymesNo results for input(s): TROPONINI in the last 168 hours. No results for input(s): TROPIPOC in the last 168 hours.  BNP Recent Labs  Lab 05/10/19 0848  BNP 903.0*    DDimer No results for input(s): DDIMER in the last 168 hours.  Radiology/Studies:  Dg Chest Portable 1 View   Result Date: 05/10/2019 CLINICAL DATA:  Shortness of breath EXAM: PORTABLE CHEST 1 VIEW COMPARISON:  04/19/2019 FINDINGS: Heart size is enlarged. There are prominent interstitial lung markings bilaterally. No pneumothorax. Pulmonary arteries appear to be dilated which can be seen in patients with elevated pulmonary artery pressures. There are small bilateral pleural effusions. There is no acute osseous abnormality. IMPRESSION: 1. Cardiomegaly with findings consistent with congestive heart failure/volume overload. 2. Dilated pulmonary arteries which can be seen in patients with elevated pulmonary artery pressures. This is similar to recent prior CT dated 04/19/2019 given differences in technique. Electronically Signed   By: Constance Holster M.D.   On: 05/10/2019 09:14     Assessment & Plan    1. Acute on Chronic Systolic CHF - She has a known reduced EF 30-35% by echo in 08/2018.  Medical therapy has been limited in the setting of her self discontinuing several medications as she reported "epistaxis" with Losartan and "sinus congestion" with Lisinopril.  Had also discontinued Coreg for unclear reasons but reports compliance with this since her ED visit 2 weeks ago. - BNP elevated to 903 on admission and CXR consistent with CHF. She has been started on IV Lasix 40 mg twice daily. Continue to follow I&O's along with daily weights. Will order a repeat echo.  - she has been restarted on Coreg (selected over Lopressor given history of Cocaine use). Given intolerances to ACE-I/ARB, would consider the addition of Hydralazine prior to discharge if BP allows.   2. Atrial Fibrillation with RVR - new diagnosis for the patient as of 04/19/2019. She was started on anticoagulation but self discontinued due to reports of hematuria. She has been restarted on Eliquis 5mg  BID and denies any issues. - she did have elevated rates overnight and agree with titration of Coreg to 25mg  BID. Would avoid Cardizem in the setting  of her cardiomyopathy. - Could consider DCCV following 3 weeks of uninterrupted anticoagulation but I am concerned about her compliance with this in the outpatient setting.  3. Thoracic Aortic Aneurysm - at 4.0 cm by imaging in 04/2019. Annual imaging recommended.   4. Atypical Chest Pain - This appears to be occurring when she is stressed and also associated with her elevated rates. Would continue to follow symptoms with rate control. Initial HS Troponin 18 with repeat value of 17.  - repeat echocardiogram pending to assess LV function and wall motion.   5. Substance Abuse - History of cocaine and opiate abuse.  UDS on 04/19/2019 was positive for both yet she denies any cocaine use since 08/2018. Reviewed risks of continued use with the patient given her multiple cardiac issues.  For questions or updates, please contact [place] Please consult [email or web address] for contact info under Cardiology/STEMI.  Signed, [name], PA-C 05/11/2019, 9:23 AM Pager: [phone]  The patient was seen and examined, and I agree with the history, physical exam, assessment and plan as documented above, with modifications as noted below. I have also personally reviewed all relevant documentation, old records, labs, and both radiographic and cardiovascular studies. I have also independently interpreted old and new ECG's.  Briefly, this is a 53 year old woman with a history of substance abuse and chronic systolic heart failure who is being evaluated for rapid atrial fibrillation and acute on chronic systolic heart failure.  She has complained of several adverse reactions to many cardiac medications as detailed above.  She was started on carvedilol (previous intolerance) for heart rate control which has been increased to 25 mg twice daily.  Labs and chest x-ray reviewed above with evidence of CHF.  She was started on IV Lasix 40 mg twice daily.  She was also restarted on Eliquis 5 mg twice  daily.  Echocardiogram is currently being performed at the time of my evaluation.  I spoke to her about the importance of both carvedilol as well as Eliquis, the latter for thromboembolic prevention.  She is presently in agreement with this.  Heart rate is presently controlled. We will continue to follow along with you.   Kate Sable, MD, [facility]  05/11/2019 11:00 AM

## 2019-05-11 NOTE — Progress Notes (Signed)
  Echocardiogram 2D Echocardiogram has been performed.  Robin [name] 05/11/2019, 11:19 AM

## 2019-05-11 NOTE — Progress Notes (Addendum)
Patient Demographics:    Robin [name], is a 53 y.o. female, DOB - 05/28/1966, KT:[id number]  Admit date - 05/10/2019   Admitting Physician Anvay Tennis Denton Brick, MD  Outpatient Primary MD for the patient is Inda Coke, [place]  LOS - 1   Chief Complaint  Patient presents with  . Chest Pain        Subjective:    Robin [name] today has no fevers, no emesis,  No chest pain, shortness of breath, dyspnea on exertion, orthopnea persist, overnight had episodes of A. fib with RVR  Assessment  & Plan :    Principal Problem:   Acute exacerbation of CHF (congestive heart failure) (HCC) Active Problems:   Respiratory failure with hypoxia (HCC)   COPD (chronic obstructive pulmonary disease) (HCC)   Atrial fibrillation with RVR (HCC)   1)HFrEF--acute exacerbation of systolic dysfunction CHF, last known EF 30  to 35%, suspect cardiomyopathy due to cocaine/substance abuse, CHF flareup at this time is due to A. fib with RVR -On admission BNP was 903 which is way higher than her previous baseline and chest x-ray consistent with CHF -Diuresed with IV Lasix, daily weights and fluid input and output monitoring -Patient admits to very high salt diet PTA  2) chronic Afib now with RVR- Initially started on IV Cardizem drip with good rate control however patient's EF is 30 to 35% so given negative UDS for cocaine would prefer beta-blocker over CCB for rate control at this time. -Eliquis for stroke prophylaxis as previously recommended but patient has not been compliant -Increase Coreg to 25 mg twice daily -TSH is 1.9  3) atypical chest pain--due to #2 above, serial troponins negative so far.   4)DM2-last A1c 12, reflecting uncontrolled diabetic due to noncompliance.-PTA patient was on 70/30 insulin, use Lantus insulin 10 units nightly along with sliding scale coverage -Continue metformin  5) history of polysubstance  abuse including cocaine--UDS this time positive only for opiates negative for cocaine this time around  Disposition/Need for in-Hospital Stay- patient unable to be discharged at this time due to A. fib with RVR requiring titration of beta-blocker, symptomatic CHF with orthopnea, dyspnea on exertion and even shortness of breath at rest*  Code Status : FULL  Family Communication:   NA (patient is alert, awake and coherent)   Disposition Plan  : Home  Consults  : Cardiology  DVT Prophylaxis  : Eliquis SCDs   Lab Results  Component Value Date   PLT 238 05/11/2019    Inpatient Medications  Scheduled Meds: . apixaban  5 mg Oral BID  . carvedilol  25 mg Oral BID WC  . furosemide  40 mg Intravenous BID  . gabapentin  100 mg Oral TID  . insulin aspart  0-5 Units Subcutaneous QHS  . insulin aspart  0-9 Units Subcutaneous TID WC  . insulin glargine  10 Units Subcutaneous QHS  . linaclotide  145 mcg Oral QAC breakfast  . metFORMIN  1,000 mg Oral BID WC  . pantoprazole  40 mg Oral Daily  . potassium chloride SA  20 mEq Oral BID  . sodium chloride flush  3 mL Intravenous Q12H   Continuous Infusions: . sodium chloride     PRN Meds:.sodium chloride, acetaminophen **OR** acetaminophen, albuterol, ondansetron **  OR** ondansetron (ZOFRAN) IV, polyethylene glycol, sodium chloride flush, traZODone    Anti-infectives (From admission, onward)   None        Objective:   Vitals:   05/10/19 2157 05/11/19 0157 05/11/19 0636 05/11/19 1351  BP: 119/83 (!) 153/94 (!) 142/114 120/82  Pulse: 66 92 97 99  Resp: 16 16 20 19   Temp: 98 F (36.7 C) 98.1 F (36.7 C) 98 F (36.7 C) 98.1 F (36.7 C)  TempSrc: Oral Oral Oral Oral  SpO2: 94% 98% 99%   Weight:   98.3 kg   Height:        Wt Readings from Last 3 Encounters:  05/11/19 98.3 kg  04/19/19 99.3 kg  01/06/19 99.3 kg     Intake/Output Summary (Last 24 hours) at 05/11/2019 1841 Last data filed at 05/11/2019 1435 Gross per 24 hour   Intake 606 ml  Output -  Net 606 ml     Physical Exam  Gen:- Awake Alert, some conversational dyspnea HEENT:- [place].AT, No sclera icterus Neck-Supple Neck, +ve JVD,.  Lungs-diminished in bases, no wheezing CV- S1, S2 normal, irregularly irregular  abd-  +ve B.Sounds, Abd Soft, No tenderness,    Extremity/Skin:-Trace edema, pedal pulses present  Psych-affect is appropriate, oriented x3 Neuro-no new focal deficits, no tremors   Data Review:   Micro Results Recent Results (from the past 240 hour(s))  SARS Coronavirus 2 [facility] order, Performed in [facility] hospital lab) Nasopharyngeal Nasopharyngeal Swab     Status: None   Collection Time: 05/10/19 10:07 AM   Specimen: Nasopharyngeal Swab  Result Value Ref Range Status   SARS Coronavirus 2 NEGATIVE NEGATIVE Final    Comment: (NOTE) If result is NEGATIVE SARS-CoV-2 target nucleic acids are NOT DETECTED. The SARS-CoV-2 RNA is generally detectable in upper and lower  respiratory specimens during the acute phase of infection. The lowest  concentration of SARS-CoV-2 viral copies this assay can detect is 250  copies / mL. A negative result does not preclude SARS-CoV-2 infection  and should not be used as the sole basis for treatment or other  patient management decisions.  A negative result may occur with  improper specimen collection / handling, submission of specimen other  than nasopharyngeal swab, presence of viral mutation(s) within the  areas targeted by this assay, and inadequate number of viral copies  (<250 copies / mL). A negative result must be combined with clinical  observations, patient history, and epidemiological information. If result is POSITIVE SARS-CoV-2 target nucleic acids are DETECTED. The SARS-CoV-2 RNA is generally detectable in upper and lower  respiratory specimens dur ing the acute phase of infection.  Positive  results are indicative of active infection with SARS-CoV-2.  Clinical  correlation with  patient history and other diagnostic information is  necessary to determine patient infection status.  Positive results do  not rule out bacterial infection or co-infection with other viruses. If result is PRESUMPTIVE POSTIVE SARS-CoV-2 nucleic acids MAY BE PRESENT.   A presumptive positive result was obtained on the submitted specimen  and confirmed on repeat testing.  While 2019 novel coronavirus  (SARS-CoV-2) nucleic acids may be present in the submitted sample  additional confirmatory testing may be necessary for epidemiological  and / or clinical management purposes  to differentiate between  SARS-CoV-2 and other Sarbecovirus currently known to infect humans.  If clinically indicated additional testing with an alternate test  methodology [phone]) is advised. The SARS-CoV-2 RNA is generally  detectable in upper and lower respiratory  sp ecimens during the acute  phase of infection. The expected result is Negative. Fact Sheet for Patients:  StrictlyIdeas.no Fact Sheet for Healthcare Providers: BankingDealers.co.za This test is not yet approved or cleared by the [place] FDA and has been authorized for detection and/or diagnosis of SARS-CoV-2 by FDA under an Emergency Use Authorization (EUA).  This EUA will remain in effect (meaning this test can be used) for the duration of the COVID-19 declaration under Section 564(b)(1) of the Act, 21 U.S.C. section 360bbb-3(b)(1), unless the authorization is terminated or revoked sooner. Performed at [facility], [address], [place], [place] [address]     Radiology Reports Dg Chest 2 View  Result Date: 04/19/2019 CLINICAL DATA:  Abdominal pain and dyspnea. EXAM: CHEST - 2 VIEW COMPARISON:  Chest radiograph dated 08/24/2018. FINDINGS: The heart remains enlarged. Both lungs are clear. The visualized skeletal structures are unremarkable. IMPRESSION: No active cardiopulmonary disease.   Cardiomegaly. Electronically Signed   By: Zerita Boers M.D.   On: 04/19/2019 14:09   Dg Chest Portable 1 View  Result Date: 05/10/2019 CLINICAL DATA:  Shortness of breath EXAM: PORTABLE CHEST 1 VIEW COMPARISON:  04/19/2019 FINDINGS: Heart size is enlarged. There are prominent interstitial lung markings bilaterally. No pneumothorax. Pulmonary arteries appear to be dilated which can be seen in patients with elevated pulmonary artery pressures. There are small bilateral pleural effusions. There is no acute osseous abnormality. IMPRESSION: 1. Cardiomegaly with findings consistent with congestive heart failure/volume overload. 2. Dilated pulmonary arteries which can be seen in patients with elevated pulmonary artery pressures. This is similar to recent prior CT dated 04/19/2019 given differences in technique. Electronically Signed   By: Constance Holster M.D.   On: 05/10/2019 09:14   Ct Angio Chest/abd/pel For Dissection W And/or W/wo  Result Date: 04/19/2019 CLINICAL DATA:  Epigastric pain. EXAM: CT ANGIOGRAPHY CHEST, ABDOMEN AND PELVIS TECHNIQUE: Multidetector CT imaging through the chest, abdomen and pelvis was performed using the standard protocol during bolus administration of intravenous contrast. Multiplanar reconstructed images and MIPs were obtained and reviewed to evaluate the vascular anatomy. CONTRAST:  162mL OMNIPAQUE IOHEXOL 350 MG/ML SOLN COMPARISON:  CT abdomen pelvis dated October 02, 2018. CTA chest dated August 25, 2018. FINDINGS: CTA CHEST FINDINGS Cardiovascular: Preferential opacification of the thoracic aorta. No evidence of thoracic aortic dissection. Unchanged ascending thoracic aortic aneurysm measuring 4.0 cm. Unchanged moderate cardiomegaly. No pericardial effusion. No central pulmonary embolism. Mediastinum/Nodes: No pathologically enlarged mediastinal, hilar, or axillary lymph nodes. Unchanged subcentimeter mediastinal and hilar lymph nodes, likely reactive. The thyroid gland,  trachea, and esophagus demonstrate no significant findings. Lungs/Pleura: New trace left pleural effusion. Scattered mild interlobular septal thickening throughout both lungs. Minimal subsegmental atelectasis in both lower lobes. No focal consolidation or pneumothorax. No suspicious pulmonary nodule. Musculoskeletal: No chest wall abnormality. No acute or significant osseous findings. Review of the MIP images confirms the above findings. CTA ABDOMEN AND PELVIS FINDINGS VASCULAR Aorta: Normal caliber aorta without aneurysm, dissection, vasculitis or significant stenosis. Celiac: Patent without evidence of aneurysm, dissection, vasculitis or significant stenosis. SMA: Patent without evidence of aneurysm, dissection, vasculitis or significant stenosis. Replaced right hepatic artery. Renals: Both renal arteries are patent without evidence of aneurysm, dissection, vasculitis, fibromuscular dysplasia or significant stenosis. IMA: Patent without evidence of aneurysm, dissection, vasculitis or significant stenosis. Inflow: Patent without evidence of aneurysm, dissection, vasculitis or significant stenosis. Veins: No obvious venous abnormality within the limitations of this arterial phase study. Review of the MIP images confirms the above findings.  NON-VASCULAR Hepatobiliary: No focal liver abnormality is seen. Status post cholecystectomy. No biliary dilatation. Pancreas: Unremarkable. No pancreatic ductal dilatation or surrounding inflammatory changes. Spleen: Normal in size without focal abnormality. Adrenals/Urinary Tract: Adrenal glands are unremarkable. Kidneys are normal, without renal calculi, focal lesion, or hydronephrosis. Bladder is decompressed. Stomach/Bowel: Stomach is within normal limits. Appendix appears normal. No evidence of bowel wall thickening, distention, or inflammatory changes. Lymphatic: No enlarged abdominal or pelvic lymph nodes. Reproductive: Status post hysterectomy. No adnexal masses. Other: No  abdominal wall hernia or abnormality. No abdominopelvic ascites. No pneumoperitoneum. Musculoskeletal: No acute or significant osseous findings. Review of the MIP images confirms the above findings. IMPRESSION: Vascular: 1. No evidence of acute aortic syndrome. 2. Unchanged ascending thoracic aortic aneurysm measuring 4.0 cm. Recommend annual imaging followup by CTA or MRA. This recommendation follows 2010 ACCF/AHA/AATS/ACR/ASA/SCA/SCAI/SIR/STS/SVM Guidelines for the Diagnosis and Management of Patients with Thoracic Aortic Disease. Circulation. 2010; [id number]. Aortic aneurysm NOS (ICD10-I71.9) 3.  Aortic atherosclerosis (ICD10-I70.0). Chest: 1. Mild interstitial pulmonary edema and trace left pleural effusion. 2. Unchanged moderate cardiomegaly. Abdomen and pelvis: 1.  No acute intra-abdominal process. Electronically Signed   By: Titus Dubin M.D.   On: 04/19/2019 16:49     CBC Recent Labs  Lab 05/10/19 0848 05/11/19 0530  WBC 7.7 6.7  HGB 13.1 12.6  HCT 41.6 40.0  PLT 271 238  MCV 87.9 88.1  MCH 27.7 27.8  MCHC 31.5 31.5  RDW 13.2 13.1  LYMPHSABS 2.4  --   MONOABS 0.5  --   EOSABS 0.1  --   BASOSABS 0.0  --     Chemistries  Recent Labs  Lab 05/10/19 0848 05/11/19 0530 05/11/19 0813  NA 137 136  --   K 4.0 3.7  --   CL 105 102  --   CO2 22 27  --   GLUCOSE 268* 302*  --   BUN 13 15  --   CREATININE 0.79 0.84  --   CALCIUM 8.8* 9.3  --   MG  --   --  2.1  AST 18  --   --   ALT 18  --   --   ALKPHOS 117  --   --   BILITOT 0.8  --   --    ------------------------------------------------------------------------------------------------------------------ No results for input(s): CHOL, HDL, LDLCALC, TRIG, CHOLHDL, LDLDIRECT in the last 72 hours.  Lab Results  Component Value Date   HGBA1C 12.0 (H) 05/10/2019   ------------------------------------------------------------------------------------------------------------------ Recent Labs    05/10/19 1536  TSH  1.939   ------------------------------------------------------------------------------------------------------------------ No results for input(s): VITAMINB12, FOLATE, FERRITIN, TIBC, IRON, RETICCTPCT in the last 72 hours.  Coagulation profile No results for input(s): INR, PROTIME in the last 168 hours.  No results for input(s): DDIMER in the last 72 hours.  Cardiac Enzymes No results for input(s): CKMB, TROPONINI, MYOGLOBIN in the last 168 hours.  Invalid input(s): CK ------------------------------------------------------------------------------------------------------------------    Component Value Date/Time   BNP 903.0 (H) 05/10/2019 [id number]     Roxan Hockey M.D on 05/11/2019 at 6:41 PM  Go to [email or web address] - for contact info  [facility] - Office  [phone]

## 2019-05-11 NOTE — Progress Notes (Signed)
Patient on telemetry.  Patient heart rate increasing to 150 and 160s, and then decreasing.  Patient in room asleep,no c/o pain at this time.  Blood pressure stable.  Notified MD, received orders for heart rate.  Will continue to monitor patient.

## 2019-05-11 NOTE — Progress Notes (Addendum)
Inpatient Diabetes Program Recommendations  AACE/ADA: New Consensus Statement on Inpatient Glycemic Control (2015)  Target Ranges:  Prepandial:   less than 140 mg/dL      Peak postprandial:   less than 180 mg/dL (1-2 hours)      Critically ill patients:  140 - 180 mg/dL   Lab Results  Component Value Date   GLUCAP 281 (H) 05/11/2019   HGBA1C 12.0 (H) 05/10/2019    Review of Glycemic Control  Diabetes history: DM2 Outpatient Diabetes medications: 70/30 15 units tidwc, metformin 123XX123 mg bid, Trulicity [id number] mg every Monday Current orders for Inpatient glycemic control: Lantus 10 units QHS, Novolog 0-9 units tidwc and 0-5 units QHS, metformin 1000 mg bid  HgbA1C - 12% PCP - [place], [place]  Inpatient Diabetes Program Recommendations:     Increase Lantus to 12 units bid Increase Novolog to 0-15 units tidwc  Add Novolog meal coverage - 5 units tidwc, if pt eats > 50% meal  Will attempt to speak with pt regarding HgbA1C of 12%.  Thank you. Lorenda Peck, RD, LDN, CDE Inpatient Diabetes Coordinator [phone]  Addendum [facility] with pt about her HgbA1C of 12%. Pt states she has not been checking her blood sugars - said she needs a new meter and supplies. Pt said she never misses her insulin - 70/30 15 units tidwc. Admits to eating a lot of breads and starches, likely not enough protein. Discussed hypoglycemia s/s and treatment. Instructed pt to f/u with PCP and check blood sugars at least 3x/day. If blood sugars mostly > 200 mg/dL, call MD to get adjustments to insulin regimen. Pt voices understanding.

## 2019-05-11 NOTE — Progress Notes (Signed)
Night shift [facility] telemetry coverage note.  The staff reports that the patient has been in and out of A. fib with RVR in the 160s to 170s.  She has also shown episodes of bigeminy.  This has happened while she is sleeping and asymptomatic.  She was earlier on Cardizem infusion and is scheduled to receive 18.75 mg of carvedilol later today.  Her blood pressure is 153/94 mmHg and current pulse is around 90 bpm.  She continues to be sleeping.  I have ordered Lopressor 5 mg IVP x1 and 2 g of magnesium sulfate IVPB.  Tennis Must, MD

## 2019-05-12 LAB — URINALYSIS, ROUTINE W REFLEX MICROSCOPIC
Bilirubin Urine: NEGATIVE
Glucose, UA: NEGATIVE mg/dL
Hgb urine dipstick: NEGATIVE
Ketones, ur: NEGATIVE mg/dL
Leukocytes,Ua: NEGATIVE
Nitrite: NEGATIVE
Protein, ur: NEGATIVE mg/dL
Specific Gravity, Urine: 1.009 (ref 1.005–1.030)
pH: 7 (ref 5.0–8.0)

## 2019-05-12 LAB — GLUCOSE, CAPILLARY
Glucose-Capillary: 224 mg/dL — ABNORMAL HIGH (ref 70–99)
Glucose-Capillary: 155 mg/dL — ABNORMAL HIGH (ref 70–99)
Glucose-Capillary: 276 mg/dL — ABNORMAL HIGH (ref 70–99)
Glucose-Capillary: 207 mg/dL — ABNORMAL HIGH (ref 70–99)

## 2019-05-12 LAB — BASIC METABOLIC PANEL
Anion gap: 12 (ref 5–15)
BUN: 19 mg/dL (ref 6–20)
CO2: 25 mmol/L (ref 22–32)
Calcium: 9.3 mg/dL (ref 8.9–10.3)
Chloride: 98 mmol/L (ref 98–111)
Creatinine, Ser: 0.88 mg/dL (ref 0.44–1.00)
GFR calc Af Amer: >60 mL/min (ref >60)
GFR calc non Af Amer: >60 mL/min (ref >60)
Glucose, Bld: 244 mg/dL — ABNORMAL HIGH (ref 70–99)
Potassium: 4 mmol/L (ref 3.5–5.1)
Sodium: 135 mmol/L (ref 135–145)

## 2019-05-12 MED ORDER — CARVEDILOL 12.5 MG PO TABS
12.5000 mg | ORAL_TABLET | Freq: Once | ORAL | Status: AC
  Administered 2019-05-12: 12.5 mg via ORAL
  Filled 2019-05-12: qty 1

## 2019-05-12 MED ORDER — INSULIN ASPART PROT & ASPART (70-30 MIX) 100 UNIT/ML ~~LOC~~ SUSP
15.0000 [IU] | Freq: Two times a day (BID) | SUBCUTANEOUS | Status: DC
  Administered 2019-05-12 – 2019-05-14 (×4): 15 [IU] via SUBCUTANEOUS
  Filled 2019-05-12: qty 10

## 2019-05-12 MED ORDER — HYDRALAZINE HCL 10 MG PO TABS
10.0000 mg | ORAL_TABLET | Freq: Two times a day (BID) | ORAL | Status: DC
  Administered 2019-05-12 – 2019-05-14 (×5): 10 mg via ORAL
  Filled 2019-05-12 (×5): qty 1

## 2019-05-12 MED ORDER — CARVEDILOL 12.5 MG PO TABS
37.5000 mg | ORAL_TABLET | Freq: Two times a day (BID) | ORAL | Status: DC
  Administered 2019-05-13 – 2019-05-14 (×3): 37.5 mg via ORAL
  Filled 2019-05-12 (×3): qty 3

## 2019-05-12 NOTE — Progress Notes (Signed)
Pt ambulated approx. 250 feet in the hallway. Tolerated well. HR stayed in the 110-125 range. Cardiology PA made aware. Will continue to monitor.

## 2019-05-12 NOTE — Progress Notes (Addendum)
Progress Note  Patient Name: Robin [name] Date of Encounter: 05/12/2019  Primary Cardiologist: Carlyle Dolly, MD   Subjective   Reports feeling significantly better. No recurrent chest pain. Denies any palpitations. She has been ambulating to the restroom without difficulty.   Inpatient Medications    Scheduled Meds: . apixaban  5 mg Oral BID  . carvedilol  25 mg Oral BID WC  . furosemide  40 mg Intravenous BID  . gabapentin  100 mg Oral TID  . insulin aspart  0-5 Units Subcutaneous QHS  . insulin aspart  0-9 Units Subcutaneous TID WC  . insulin glargine  10 Units Subcutaneous QHS  . linaclotide  145 mcg Oral QAC breakfast  . metFORMIN  1,000 mg Oral BID WC  . pantoprazole  40 mg Oral Daily  . potassium chloride SA  20 mEq Oral BID  . sodium chloride flush  3 mL Intravenous Q12H   Continuous Infusions: . sodium chloride     PRN Meds: sodium chloride, acetaminophen **OR** acetaminophen, albuterol, ondansetron **OR** ondansetron (ZOFRAN) IV, polyethylene glycol, sodium chloride flush, traZODone   Vital Signs    Vitals:   05/11/19 1351 05/11/19 1949 05/11/19 2138 05/12/19 0454  BP: 120/82  131/89 (!) 150/89  Pulse: 99  (!) 105 (!) 104  Resp: 19  18 20   Temp: 98.1 F (36.7 C)  98.4 F (36.9 C) 98 F (36.7 C)  TempSrc: Oral  Oral Oral  SpO2:  99% 100% 93%  Weight:    97.7 kg  Height:        Intake/Output Summary (Last 24 hours) at 05/12/2019 0755 Last data filed at 05/12/2019 0400 Gross per 24 hour  Intake 757.35 ml  Output -  Net 757.35 ml    Last 3 Weights 05/12/2019 05/11/2019 05/10/2019  Weight (lbs) 215 lb 6.2 oz 216 lb 11.2 oz 219 lb  Weight (kg) 97.7 kg 98.294 kg 99.338 kg      Telemetry    Atrial fibrillation, HR mostly in 90's to low-100's. - Personally Reviewed  ECG    No new tracings.   Physical Exam   General: Well developed, well nourished, female appearing in no acute distress. Head: Normocephalic, atraumatic.  Neck: Supple  without bruits, JVD not elevated. Lungs:  Resp regular and unlabored, CTA without wheezing or rales. Heart: Irregularly irregular, S1, S2, no S3, S4, or murmur; no rub. Abdomen: Soft, non-tender, non-distended with normoactive bowel sounds. No hepatomegaly. No rebound/guarding. No obvious abdominal masses. Extremities: No clubbing, cyanosis, or lower extremity edema. Distal pedal pulses are 2+ bilaterally. Neuro: Alert and oriented X 3. Moves all extremities spontaneously. Psych: Normal affect.  Labs    Chemistry Recent Labs  Lab 05/10/19 0848 05/11/19 0530  NA 137 136  K 4.0 3.7  CL 105 102  CO2 22 27  GLUCOSE 268* 302*  BUN 13 15  CREATININE 0.79 0.84  CALCIUM 8.8* 9.3  PROT 6.9  --   ALBUMIN 3.6  --   AST 18  --   ALT 18  --   ALKPHOS 117  --   BILITOT 0.8  --   GFRNONAA >60 >60  GFRAA >60 >60  ANIONGAP 10 7     Hematology Recent Labs  Lab 05/10/19 0848 05/11/19 0530  WBC 7.7 6.7  RBC 4.73 4.54  HGB 13.1 12.6  HCT 41.6 40.0  MCV 87.9 88.1  MCH 27.7 27.8  MCHC 31.5 31.5  RDW 13.2 13.1  PLT 271 238  Cardiac EnzymesNo results for input(s): TROPONINI in the last 168 hours. No results for input(s): TROPIPOC in the last 168 hours.   BNP Recent Labs  Lab 05/10/19 0848  BNP 903.0*     DDimer No results for input(s): DDIMER in the last 168 hours.   Radiology    Dg Chest Portable 1 View  Result Date: 05/10/2019 CLINICAL DATA:  Shortness of breath EXAM: PORTABLE CHEST 1 VIEW COMPARISON:  04/19/2019 FINDINGS: Heart size is enlarged. There are prominent interstitial lung markings bilaterally. No pneumothorax. Pulmonary arteries appear to be dilated which can be seen in patients with elevated pulmonary artery pressures. There are small bilateral pleural effusions. There is no acute osseous abnormality. IMPRESSION: 1. Cardiomegaly with findings consistent with congestive heart failure/volume overload. 2. Dilated pulmonary arteries which can be seen in patients  with elevated pulmonary artery pressures. This is similar to recent prior CT dated 04/19/2019 given differences in technique. Electronically Signed   By: Constance Holster M.D.   On: 05/10/2019 09:14    Cardiac Studies   Echocardiogram: 05/11/2019 IMPRESSIONS    1. Left ventricular ejection fraction, by visual estimation, is 35%. The left ventricle has moderate to severely decreased function. Normal left ventricular size. There is moderately increased left ventricular hypertrophy.  2. Elevated left ventricular end-diastolic pressure.  3. Left ventricular diastolic function could not be evaluated pattern of LV diastolic filling.  4. LV diffusely hypokinetic.  5. Global right ventricle has moderately reduced systolic function.The right ventricular size is normal. Mildly increased right ventricular wall thickness.  6. Left atrial size was mildly dilated.  7. Right atrial size was mildly dilated.  8. Moderate aortic valve annular calcification.  9. The mitral valve is grossly normal. Mild mitral valve regurgitation. 10. The tricuspid valve is grossly normal. Tricuspid valve regurgitation mild-moderate. 11. The aortic valve is tricuspid Aortic valve regurgitation was not visualized by color flow Doppler. Mild aortic valve stenosis. 12. The pulmonic valve was not well visualized. Pulmonic valve regurgitation is not visualized by color flow Doppler. 13. Normal pulmonary artery systolic pressure. 14. The inferior vena cava is dilated in size with >50% respiratory variability, suggesting right atrial pressure of 8 mmHg.  Patient Profile     53 y.o. female with past medical history of chronic systolic CHF (EF at [id number] by echo in 08/2018),HTN, COPD,uncontrolled IDDM, thoracic aortic aneurysm (at 4.0 cm by imaging in 04/2019), history of normal cors by cath in 2010, recently diagnosed atrial fibrillation (as of 04/19/2019)  and prior substance abuse who is currently admitted for atrial fibrillation  with RVR and CHF exacerbation.   Assessment & Plan    1. Acute on Chronic Systolic CHF - repeat echo this admission shows slight improvement in her EF from 30% in 08/2018 to 35% this admission. She reports improvement in her respiratory status and weight has declined by 4 lbs since admission. Remains on IV Lasix 40mg  BID. Repeat BMET pending this AM. Was on Lasix 20mg  BID prior to admission. May need to further titrate to 40mg  in AM/20mg  in PM or consider switching to Torsemide prior to discharge.  - she has been continued on Coreg which has been titrated in the setting of her atrial fibrillation with RVR. Intolerant to Lisinopril (reported sinus congestion) and Losartan (reported epistaxis), therefore will try low-dose Hydralazine and plan to add Isordil at the time of follow-up if BP allows.   2. Atrial Fibrillation with RVR - new diagnosis for the patient 3 weeks ago but she self-discontinued  this as an outpatient. She reports recurrent hematuria this morning but in looking at this it appears concentrated. Will check a UA for clarification. Continue Eliquis 5mg  BID for now. If compliant with this as an outpatient, consider DCCV in 3 weeks if still in atrial fibrillation. - rates overall well-controlled in the 90's to low-100's. I have asked the patient's nurse to ambulate her down the hallway later today and assess HR with activity. If still elevated, Coreg could be further titrated beyond her current dosing of 25mg  BID based off of her weight being greater than 85 kg.   3. Thoracic Aortic Aneurysm - at 4.0 cm by imaging in 04/2019. Continue with plan for annual imaging as an outpatient.   4. Atypical Chest Pain - Troponin values have been negative at 17 and 18 this admission. EKG without acute ischemic changes and she denies any recurrent symptoms. No plans for ischemic testing as an inpatient.   5. Substance Abuse - History of cocaine and opiate abuse. UDS on 04/19/2019 was positive for  both yet she denies any cocaine use since 08/2018. Advised against recurrent use.     For questions or updates, please contact [place] Please consult [email or web address] for contact info under Cardiology/STEMI.   Signed, [name] , PA-C 7:55 AM 05/12/2019 Pager: [phone]  The patient was seen and examined, and I agree with the history, physical exam, assessment and plan as documented above.  Symptomatically improved with diuresis. Normal renal function. HR reasonably controlled for now. Can continue Coreg 25 mg bid for now. Can consider DCCV after 3 weeks of uninterrupted anticoagulation. Continue IV Lasix 40 mg bid. I agree with switching to torsemide 20 mg bid at time of discharge.   Kate Sable, MD, [facility]  05/12/2019 10:00 AM

## 2019-05-12 NOTE — Progress Notes (Signed)
Inpatient Diabetes Program Recommendations  AACE/ADA: New Consensus Statement on Inpatient Glycemic Control (2015)  Target Ranges:  Prepandial:   less than 140 mg/dL      Peak postprandial:   less than 180 mg/dL (1-2 hours)      Critically ill patients:  140 - 180 mg/dL   Lab Results  Component Value Date   GLUCAP 224 (H) 05/12/2019   HGBA1C 12.0 (H) 05/10/2019    Review of Glycemic Control  FBS 224, 244 mg/dL Post-prandials elevated. Needs insulin adjustment.  Inpatient Diabetes Program Recommendations:     D/C Lantus  Begin 70/30 15 units bid (home dose is 15 units TID) Increase Novolog to 0-15 units tidwc and 0-5 units QHS  Will need to be d/ced on Humulin 70/30 insulin since pt has no insurance. Will need Blood glucose meter kit and supplies  Continue to follow while inpatient.  Thank you. Lorenda Peck, RD, LDN, CDE Inpatient Diabetes Coordinator [phone]

## 2019-05-12 NOTE — Progress Notes (Signed)
Patient Demographics:    Robin [name], is a 53 y.o. female, DOB - 19-Mar-1966, [id number]  Admit date - 05/10/2019   Admitting Physician Eliott Amparan Denton Brick, MD  Outpatient Primary MD for the patient is Inda Coke, [place]  LOS - 2   Chief Complaint  Patient presents with  . Chest Pain        Subjective:    Robin [name] today has no fevers, no emesis, patient had left inframammary area discomfort, which appears to be reproducible with palpation and positional change.  No shortness of breath at rest, some dyspnea on exertion persist -heart rate up to 128 by just ambulating around the room  Assessment  & Plan :    Principal Problem:   Acute exacerbation of CHF (congestive heart failure) (HCC) Active Problems:   Respiratory failure with hypoxia (HCC)   COPD (chronic obstructive pulmonary disease) (HCC)   Atrial fibrillation with RVR (HCC)   1)HFrEF--acute exacerbation of systolic dysfunction CHF, last known EF 30  to 35%, suspect cardiomyopathy due to cocaine/substance abuse, CHF flareup at this time is due to A. fib with RVR -On admission BNP was 903 which is way higher than her previous baseline and chest x-ray consistent with CHF -Diuresed with IV Lasix, daily weights and fluid input and output monitoring  - wt is down to 215 from 219 pounds -Fluid balance does not appear to be accurate -Patient admits to very high salt diet PTA  2)Chronic Afib now with RVR- Initially started on IV Cardizem drip with good rate control however patient's EF is 30 to 35% so given negative UDS for cocaine would prefer beta-blocker over CCB for rate control at this time. -Eliquis for stroke prophylaxis as previously recommended but patient has not been compliant -c/n Coreg to 25 mg twice daily -TSH is 1.9 -Cardiology consult appreciated -May be a candidate for cardioversion down the road if she can make compliant  with anticoagulation  3) atypical chest pain--due to #2 above, serial troponins negative.  ACS clinically unlikely  4)DM2-last A1c 12, reflecting uncontrolled diabetic due to noncompliance.-PTA patient was on 70/30 insulin, u -We will resume home dose of 70/30 insulin at 15 units twice daily to try to mimic patient's home dose -Continue metformin Use Novolog/Humalog Sliding scale insulin with Accu-Cheks/Fingersticks as ordered    5) history of polysubstance abuse including cocaine--UDS this time positive only for opiates negative for cocaine this time around  Disposition/Need for in-Hospital Stay- patient unable to be discharged at this time due to A. fib with RVR requiring titration of beta-blocker, symptomatic CHF dyspnea on exertion requiring IV Lasix  Code Status : FULL  Family Communication:   NA (patient is alert, awake and coherent)  Disposition Plan  : Home  Consults  : Cardiology  DVT Prophylaxis  : Eliquis SCDs   Lab Results  Component Value Date   PLT 238 05/11/2019    Inpatient Medications  Scheduled Meds: . apixaban  5 mg Oral BID  . carvedilol  25 mg Oral BID WC  . furosemide  40 mg Intravenous BID  . gabapentin  100 mg Oral TID  . hydrALAZINE  10 mg Oral BID  . insulin aspart  0-5 Units Subcutaneous QHS  . insulin aspart  0-9  Units Subcutaneous TID WC  . insulin aspart protamine- aspart  15 Units Subcutaneous BID WC  . linaclotide  145 mcg Oral QAC breakfast  . metFORMIN  1,000 mg Oral BID WC  . pantoprazole  40 mg Oral Daily  . potassium chloride SA  20 mEq Oral BID  . sodium chloride flush  3 mL Intravenous Q12H   Continuous Infusions: . sodium chloride     PRN Meds:.sodium chloride, acetaminophen **OR** acetaminophen, albuterol, ondansetron **OR** ondansetron (ZOFRAN) IV, polyethylene glycol, sodium chloride flush, traZODone    Anti-infectives (From admission, onward)   None        Objective:   Vitals:   05/11/19 2138 05/12/19 0454  05/12/19 0942 05/12/19 1229  BP: 131/89 (!) 150/89 112/89 136/89  Pulse: (!) 105 (!) 104 97 (!) 105  Resp: 18 20 20 18   Temp: 98.4 F (36.9 C) 98 F (36.7 C)  98 F (36.7 C)  TempSrc: Oral Oral  Oral  SpO2: 100% 93% 96% 90%  Weight:  97.7 kg    Height:        Wt Readings from Last 3 Encounters:  05/12/19 97.7 kg  04/19/19 99.3 kg  01/06/19 99.3 kg     Intake/Output Summary (Last 24 hours) at 05/12/2019 1346 Last data filed at 05/12/2019 1000 Gross per 24 hour  Intake 757.35 ml  Output 200 ml  Net 557.35 ml     Physical Exam  Gen:- Awake Alert, some conversational dyspnea HEENT:- [place].AT, No sclera icterus Neck-Supple Neck, +ve JVD,.  Lungs-diminished in bases, no wheezing CV- S1, S2 normal, irregularly irregular  abd-  +ve B.Sounds, Abd Soft, No tenderness,    Extremity/Skin:-Trace edema, pedal pulses present  Psych-affect is appropriate, oriented x3 Neuro-no new focal deficits, no tremors   Data Review:   Micro Results Recent Results (from the past 240 hour(s))  SARS Coronavirus 2 [facility] order, Performed in [facility] hospital lab) Nasopharyngeal Nasopharyngeal Swab     Status: None   Collection Time: 05/10/19 10:07 AM   Specimen: Nasopharyngeal Swab  Result Value Ref Range Status   SARS Coronavirus 2 NEGATIVE NEGATIVE Final    Comment: (NOTE) If result is NEGATIVE SARS-CoV-2 target nucleic acids are NOT DETECTED. The SARS-CoV-2 RNA is generally detectable in upper and lower  respiratory specimens during the acute phase of infection. The lowest  concentration of SARS-CoV-2 viral copies this assay can detect is 250  copies / mL. A negative result does not preclude SARS-CoV-2 infection  and should not be used as the sole basis for treatment or other  patient management decisions.  A negative result may occur with  improper specimen collection / handling, submission of specimen other  than nasopharyngeal swab, presence of viral mutation(s) within the  areas  targeted by this assay, and inadequate number of viral copies  (<250 copies / mL). A negative result must be combined with clinical  observations, patient history, and epidemiological information. If result is POSITIVE SARS-CoV-2 target nucleic acids are DETECTED. The SARS-CoV-2 RNA is generally detectable in upper and lower  respiratory specimens dur ing the acute phase of infection.  Positive  results are indicative of active infection with SARS-CoV-2.  Clinical  correlation with patient history and other diagnostic information is  necessary to determine patient infection status.  Positive results do  not rule out bacterial infection or co-infection with other viruses. If result is PRESUMPTIVE POSTIVE SARS-CoV-2 nucleic acids MAY BE PRESENT.   A presumptive positive result was obtained on the  submitted specimen  and confirmed on repeat testing.  While 2019 novel coronavirus  (SARS-CoV-2) nucleic acids may be present in the submitted sample  additional confirmatory testing may be necessary for epidemiological  and / or clinical management purposes  to differentiate between  SARS-CoV-2 and other Sarbecovirus currently known to infect humans.  If clinically indicated additional testing with an alternate test  methodology [phone]) is advised. The SARS-CoV-2 RNA is generally  detectable in upper and lower respiratory sp ecimens during the acute  phase of infection. The expected result is Negative. Fact Sheet for Patients:  StrictlyIdeas.no Fact Sheet for Healthcare Providers: BankingDealers.co.za This test is not yet approved or cleared by the [place] FDA and has been authorized for detection and/or diagnosis of SARS-CoV-2 by FDA under an Emergency Use Authorization (EUA).  This EUA will remain in effect (meaning this test can be used) for the duration of the COVID-19 declaration under Section 564(b)(1) of the Act, 21 U.S.C. section  360bbb-3(b)(1), unless the authorization is terminated or revoked sooner. Performed at [facility] [address] [place], [place] [address]     Radiology Reports Dg Chest 2 View  Result Date: 04/19/2019 CLINICAL DATA:  Abdominal pain and dyspnea. EXAM: CHEST - 2 VIEW COMPARISON:  Chest radiograph dated 08/24/2018. FINDINGS: The heart remains enlarged. Both lungs are clear. The visualized skeletal structures are unremarkable. IMPRESSION: No active cardiopulmonary disease.  Cardiomegaly. Electronically Signed   By: Zerita Boers M.D.   On: 04/19/2019 14:09   Dg Chest Portable 1 View  Result Date: 05/10/2019 CLINICAL DATA:  Shortness of breath EXAM: PORTABLE CHEST 1 VIEW COMPARISON:  04/19/2019 FINDINGS: Heart size is enlarged. There are prominent interstitial lung markings bilaterally. No pneumothorax. Pulmonary arteries appear to be dilated which can be seen in patients with elevated pulmonary artery pressures. There are small bilateral pleural effusions. There is no acute osseous abnormality. IMPRESSION: 1. Cardiomegaly with findings consistent with congestive heart failure/volume overload. 2. Dilated pulmonary arteries which can be seen in patients with elevated pulmonary artery pressures. This is similar to recent prior CT dated 04/19/2019 given differences in technique. Electronically Signed   By: Constance Holster M.D.   On: 05/10/2019 09:14   Ct Angio Chest/abd/pel For Dissection W And/or W/wo  Result Date: 04/19/2019 CLINICAL DATA:  Epigastric pain. EXAM: CT ANGIOGRAPHY CHEST, ABDOMEN AND PELVIS TECHNIQUE: Multidetector CT imaging through the chest, abdomen and pelvis was performed using the standard protocol during bolus administration of intravenous contrast. Multiplanar reconstructed images and MIPs were obtained and reviewed to evaluate the vascular anatomy. CONTRAST:  13mL OMNIPAQUE IOHEXOL 350 MG/ML SOLN COMPARISON:  CT abdomen pelvis dated October 02, 2018. CTA chest dated August 25, 2018. FINDINGS: CTA CHEST FINDINGS Cardiovascular: Preferential opacification of the thoracic aorta. No evidence of thoracic aortic dissection. Unchanged ascending thoracic aortic aneurysm measuring 4.0 cm. Unchanged moderate cardiomegaly. No pericardial effusion. No central pulmonary embolism. Mediastinum/Nodes: No pathologically enlarged mediastinal, hilar, or axillary lymph nodes. Unchanged subcentimeter mediastinal and hilar lymph nodes, likely reactive. The thyroid gland, trachea, and esophagus demonstrate no significant findings. Lungs/Pleura: New trace left pleural effusion. Scattered mild interlobular septal thickening throughout both lungs. Minimal subsegmental atelectasis in both lower lobes. No focal consolidation or pneumothorax. No suspicious pulmonary nodule. Musculoskeletal: No chest wall abnormality. No acute or significant osseous findings. Review of the MIP images confirms the above findings. CTA ABDOMEN AND PELVIS FINDINGS VASCULAR Aorta: Normal caliber aorta without aneurysm, dissection, vasculitis or significant stenosis. Celiac: Patent without evidence of aneurysm,  dissection, vasculitis or significant stenosis. SMA: Patent without evidence of aneurysm, dissection, vasculitis or significant stenosis. Replaced right hepatic artery. Renals: Both renal arteries are patent without evidence of aneurysm, dissection, vasculitis, fibromuscular dysplasia or significant stenosis. IMA: Patent without evidence of aneurysm, dissection, vasculitis or significant stenosis. Inflow: Patent without evidence of aneurysm, dissection, vasculitis or significant stenosis. Veins: No obvious venous abnormality within the limitations of this arterial phase study. Review of the MIP images confirms the above findings. NON-VASCULAR Hepatobiliary: No focal liver abnormality is seen. Status post cholecystectomy. No biliary dilatation. Pancreas: Unremarkable. No pancreatic ductal dilatation or surrounding inflammatory  changes. Spleen: Normal in size without focal abnormality. Adrenals/Urinary Tract: Adrenal glands are unremarkable. Kidneys are normal, without renal calculi, focal lesion, or hydronephrosis. Bladder is decompressed. Stomach/Bowel: Stomach is within normal limits. Appendix appears normal. No evidence of bowel wall thickening, distention, or inflammatory changes. Lymphatic: No enlarged abdominal or pelvic lymph nodes. Reproductive: Status post hysterectomy. No adnexal masses. Other: No abdominal wall hernia or abnormality. No abdominopelvic ascites. No pneumoperitoneum. Musculoskeletal: No acute or significant osseous findings. Review of the MIP images confirms the above findings. IMPRESSION: Vascular: 1. No evidence of acute aortic syndrome. 2. Unchanged ascending thoracic aortic aneurysm measuring 4.0 cm. Recommend annual imaging followup by CTA or MRA. This recommendation follows 2010 ACCF/AHA/AATS/ACR/ASA/SCA/SCAI/SIR/STS/SVM Guidelines for the Diagnosis and Management of Patients with Thoracic Aortic Disease. Circulation. 2010; [id number]. Aortic aneurysm NOS (ICD10-I71.9) 3.  Aortic atherosclerosis (ICD10-I70.0). Chest: 1. Mild interstitial pulmonary edema and trace left pleural effusion. 2. Unchanged moderate cardiomegaly. Abdomen and pelvis: 1.  No acute intra-abdominal process. Electronically Signed   By: Titus Dubin M.D.   On: 04/19/2019 16:49     CBC Recent Labs  Lab 05/10/19 0848 05/11/19 0530  WBC 7.7 6.7  HGB 13.1 12.6  HCT 41.6 40.0  PLT 271 238  MCV 87.9 88.1  MCH 27.7 27.8  MCHC 31.5 31.5  RDW 13.2 13.1  LYMPHSABS 2.4  --   MONOABS 0.5  --   EOSABS 0.1  --   BASOSABS 0.0  --     Chemistries  Recent Labs  Lab 05/10/19 0848 05/11/19 0530 05/11/19 0813 05/12/19 0819  NA 137 136  --  135  K 4.0 3.7  --  4.0  CL 105 102  --  98  CO2 22 27  --  25  GLUCOSE 268* 302*  --  244*  BUN 13 15  --  19  CREATININE 0.79 0.84  --  0.88  CALCIUM 8.8* 9.3  --  9.3  MG  --    --  2.1  --   AST 18  --   --   --   ALT 18  --   --   --   ALKPHOS 117  --   --   --   BILITOT 0.8  --   --   --    ------------------------------------------------------------------------------------------------------------------ No results for input(s): CHOL, HDL, LDLCALC, TRIG, CHOLHDL, LDLDIRECT in the last 72 hours.  Lab Results  Component Value Date   HGBA1C 12.0 (H) 05/10/2019   ------------------------------------------------------------------------------------------------------------------ Recent Labs    05/10/19 1536  TSH 1.939   ------------------------------------------------------------------------------------------------------------------ No results for input(s): VITAMINB12, FOLATE, FERRITIN, TIBC, IRON, RETICCTPCT in the last 72 hours.  Coagulation profile No results for input(s): INR, PROTIME in the last 168 hours.  No results for input(s): DDIMER in the last 72 hours.  Cardiac Enzymes No results for input(s): CKMB, TROPONINI, MYOGLOBIN in the last 168  hours.  Invalid input(s): CK ------------------------------------------------------------------------------------------------------------------    Component Value Date/Time   BNP 903.0 (H) 05/10/2019 [id number]   Roxan Hockey M.D on 05/12/2019 at 1:46 PM  Go to [email or web address] - for contact info  [facility] - Office  [phone]

## 2019-05-13 DIAGNOSIS — I5023 Acute on chronic systolic (congestive) heart failure: Secondary | ICD-10-CM

## 2019-05-13 DIAGNOSIS — F1910 Other psychoactive substance abuse, uncomplicated: Secondary | ICD-10-CM

## 2019-05-13 DIAGNOSIS — I712 Thoracic aortic aneurysm, without rupture, unspecified: Secondary | ICD-10-CM

## 2019-05-13 LAB — BASIC METABOLIC PANEL
Anion gap: 11 (ref 5–15)
BUN: 25 mg/dL — ABNORMAL HIGH (ref 6–20)
CO2: 23 mmol/L (ref 22–32)
Calcium: 9.2 mg/dL (ref 8.9–10.3)
Chloride: 101 mmol/L (ref 98–111)
Creatinine, Ser: 0.96 mg/dL (ref 0.44–1.00)
GFR calc Af Amer: >60 mL/min (ref >60)
GFR calc non Af Amer: >60 mL/min (ref >60)
Glucose, Bld: 222 mg/dL — ABNORMAL HIGH (ref 70–99)
Potassium: 4.1 mmol/L (ref 3.5–5.1)
Sodium: 135 mmol/L (ref 135–145)

## 2019-05-13 LAB — GLUCOSE, CAPILLARY
Glucose-Capillary: 220 mg/dL — ABNORMAL HIGH (ref 70–99)
Glucose-Capillary: 316 mg/dL — ABNORMAL HIGH (ref 70–99)
Glucose-Capillary: 183 mg/dL — ABNORMAL HIGH (ref 70–99)
Glucose-Capillary: 245 mg/dL — ABNORMAL HIGH (ref 70–99)

## 2019-05-13 MED ORDER — DIGOXIN 0.25 MG/ML IJ SOLN: 0.2500 mg | Freq: Three times a day (TID) | INTRAMUSCULAR | Status: DC

## 2019-05-13 MED ORDER — DIGOXIN 125 MCG PO TABS
0.1250 mg | ORAL_TABLET | Freq: Every day | ORAL | Status: DC
  Administered 2019-05-14: 0.125 mg via ORAL
  Filled 2019-05-13: qty 1

## 2019-05-13 MED ORDER — DIGOXIN 0.25 MG/ML IJ SOLN
0.5000 mg | Freq: Once | INTRAMUSCULAR | Status: AC
  Administered 2019-05-13: 0.5 mg via INTRAVENOUS
  Filled 2019-05-13: qty 2

## 2019-05-13 MED ORDER — TORSEMIDE 20 MG PO TABS
20.0000 mg | ORAL_TABLET | Freq: Two times a day (BID) | ORAL | Status: DC
  Administered 2019-05-14: 20 mg via ORAL
  Filled 2019-05-13: qty 1

## 2019-05-13 MED ORDER — DIGOXIN 0.25 MG/ML IJ SOLN
0.2500 mg | Freq: Once | INTRAMUSCULAR | Status: AC
  Administered 2019-05-13: 0.25 mg via INTRAVENOUS
  Filled 2019-05-13: qty 2
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
## 2019-05-13 NOTE — Progress Notes (Signed)
Patient Demographics:    Robin [name], is a 53 y.o. female, DOB - 1966/07/15, KT:[id number]  Admit date - 05/10/2019   Admitting Physician Ayce Pietrzyk Denton Brick, MD  Outpatient Primary MD for the patient is Inda Coke, [place]  LOS - 3   Chief Complaint  Patient presents with   Chest Pain        Subjective:    [name] today has no fevers, no emesis, rate control remains challenging, no shortness of breath at rest, mild dyspnea on exertion  Assessment  & Plan :    Principal Problem:   Acute exacerbation of CHF (congestive heart failure) ([place]) Active Problems:   Respiratory failure with hypoxia (HCC)   COPD (chronic obstructive pulmonary disease) ([place])   Atrial fibrillation with RVR ([place])   Acute on chronic systolic heart failure (HCC)   Thoracic aortic aneurysm without rupture ([place])   Substance abuse ([place])   1)HFrEF--acute exacerbation of systolic dysfunction CHF, last known EF 30  to 35%, suspect cardiomyopathy due to cocaine/substance abuse, CHF flareup at this time is due to A. fib with RVR  -On admission BNP was 903 which is way higher than her previous baseline and chest x-ray consistent with CHF -Initially diuresed with IV Lasix, daily weights and fluid input and output monitoring  -Switched to p.o. torsemide 20 mg twice daily - wt is down to 214 from 219 pounds -Patient admits to very high salt diet PTA  2)Chronic Afib now with RVR- Initially started on IV Cardizem drip with good rate control however patient's EF is 30 to 35% so given negative UDS for cocaine would prefer beta-blocker over CCB for rate control at this time. -Eliquis for stroke prophylaxis as previously recommended but patient has not been compliant -Rate control remains challenging, increase Coreg to 37.5 mg twice daily, add digoxin -TSH is 1.9 -Cardiology consult appreciated -May be a candidate for cardioversion  down the road if she can make compliant with anticoagulation  3) atypical chest pain--due to #2 above, serial troponins negative.  ACS clinically unlikely  4)DM2-last A1c 12, reflecting uncontrolled diabetic due to noncompliance.-PTA patient was on 70/30 insulin, u -We will resume home dose of 70/30 insulin at 15 units twice daily to try to mimic patient's home dose -Continue metformin Use Novolog/Humalog Sliding scale insulin with Accu-Cheks/Fingersticks as ordered   5) history of polysubstance abuse including cocaine--UDS this time positive only for opiates negative for cocaine this time around  Disposition/Need for in-Hospital Stay- patient unable to be discharged at this time due to A. fib with RVR requiring titration of beta-blocker, symptomatic CHF dyspnea on exertion requiring IV digoxin for rate control  Code Status : FULL  Family Communication:   NA (patient is alert, awake and coherent)  Disposition Plan  : Home  Consults  : Cardiology  DVT Prophylaxis  : Eliquis SCDs   Lab Results  Component Value Date   PLT 238 05/11/2019    Inpatient Medications  Scheduled Meds:  apixaban  5 mg Oral BID   carvedilol  37.5 mg Oral BID WC   digoxin  0.25 mg Intravenous Once   [START ON 05/14/2019] digoxin  0.125 mg Oral Daily   gabapentin  100 mg Oral TID   hydrALAZINE  10  mg Oral BID   insulin aspart  0-5 Units Subcutaneous QHS   insulin aspart  0-9 Units Subcutaneous TID WC   insulin aspart protamine- aspart  15 Units Subcutaneous BID WC   linaclotide  145 mcg Oral QAC breakfast   metFORMIN  1,000 mg Oral BID WC   pantoprazole  40 mg Oral Daily   potassium chloride SA  20 mEq Oral BID   sodium chloride flush  3 mL Intravenous Q12H   [START ON 05/14/2019] torsemide  20 mg Oral BID   Continuous Infusions:  sodium chloride     PRN Meds:.sodium chloride, acetaminophen **OR** acetaminophen, albuterol, ondansetron **OR** ondansetron (ZOFRAN) IV, polyethylene  glycol, sodium chloride flush, traZODone    Anti-infectives (From admission, onward)   None        Objective:   Vitals:   05/12/19 1956 05/12/19 2117 05/13/19 0457 05/13/19 1425  BP:  (!) 129/92 122/84 103/80  Pulse:  (!) 103 92 (!) 104  Resp:  16 16 18   Temp:   97.8 F (36.6 C) 98.1 F (36.7 C)  TempSrc:   Oral Oral  SpO2: 90% 90% 97% 100%  Weight:   97.5 kg   Height:        Wt Readings from Last 3 Encounters:  05/13/19 97.5 kg  04/19/19 99.3 kg  01/06/19 99.3 kg     Intake/Output Summary (Last 24 hours) at 05/13/2019 1716 Last data filed at 05/13/2019 1500 Gross per 24 hour  Intake 1080 ml  Output 2950 ml  Net -1870 ml     Physical Exam  Gen:- Awake Alert, some conversational dyspnea HEENT:- [place].AT, No sclera icterus Neck-Supple Neck, +ve JVD,.  Lungs-diminished in bases, no wheezing CV- S1, S2 normal, irregularly irregular  abd-  +ve B.Sounds, Abd Soft, No tenderness,    Extremity/Skin:-Trace edema, pedal pulses present  Psych-affect is appropriate, oriented x3 Neuro-no new focal deficits, no tremors   Data Review:   Micro Results Recent Results (from the past 240 hour(s))  SARS Coronavirus 2 [facility] order, Performed in [facility] hospital lab) Nasopharyngeal Nasopharyngeal Swab     Status: None   Collection Time: 05/10/19 10:07 AM   Specimen: Nasopharyngeal Swab  Result Value Ref Range Status   SARS Coronavirus 2 NEGATIVE NEGATIVE Final    Comment: (NOTE) If result is NEGATIVE SARS-CoV-2 target nucleic acids are NOT DETECTED. The SARS-CoV-2 RNA is generally detectable in upper and lower  respiratory specimens during the acute phase of infection. The lowest  concentration of SARS-CoV-2 viral copies this assay can detect is 250  copies / mL. A negative result does not preclude SARS-CoV-2 infection  and should not be used as the sole basis for treatment or other  patient management decisions.  A negative result may occur with  improper specimen  collection / handling, submission of specimen other  than nasopharyngeal swab, presence of viral mutation(s) within the  areas targeted by this assay, and inadequate number of viral copies  (<250 copies / mL). A negative result must be combined with clinical  observations, patient history, and epidemiological information. If result is POSITIVE SARS-CoV-2 target nucleic acids are DETECTED. The SARS-CoV-2 RNA is generally detectable in upper and lower  respiratory specimens dur ing the acute phase of infection.  Positive  results are indicative of active infection with SARS-CoV-2.  Clinical  correlation with patient history and other diagnostic information is  necessary to determine patient infection status.  Positive results do  not rule out bacterial infection or  co-infection with other viruses. If result is PRESUMPTIVE POSTIVE SARS-CoV-2 nucleic acids MAY BE PRESENT.   A presumptive positive result was obtained on the submitted specimen  and confirmed on repeat testing.  While 2019 novel coronavirus  (SARS-CoV-2) nucleic acids may be present in the submitted sample  additional confirmatory testing may be necessary for epidemiological  and / or clinical management purposes  to differentiate between  SARS-CoV-2 and other Sarbecovirus currently known to infect humans.  If clinically indicated additional testing with an alternate test  methodology [phone]) is advised. The SARS-CoV-2 RNA is generally  detectable in upper and lower respiratory sp ecimens during the acute  phase of infection. The expected result is Negative. Fact Sheet for Patients:  StrictlyIdeas.no Fact Sheet for Healthcare Providers: BankingDealers.co.za This test is not yet approved or cleared by the [place] FDA and has been authorized for detection and/or diagnosis of SARS-CoV-2 by FDA under an Emergency Use Authorization (EUA).  This EUA will remain in effect  (meaning this test can be used) for the duration of the COVID-19 declaration under Section 564(b)(1) of the Act, 21 U.S.C. section 360bbb-3(b)(1), unless the authorization is terminated or revoked sooner. Performed at [facility], [address], [place], [place] [address]     Radiology Reports Dg Chest 2 View  Result Date: 04/19/2019 CLINICAL DATA:  Abdominal pain and dyspnea. EXAM: CHEST - 2 VIEW COMPARISON:  Chest radiograph dated 08/24/2018. FINDINGS: The heart remains enlarged. Both lungs are clear. The visualized skeletal structures are unremarkable. IMPRESSION: No active cardiopulmonary disease.  Cardiomegaly. Electronically Signed   By: Zerita Boers M.D.   On: 04/19/2019 14:09   Dg Chest Portable 1 View  Result Date: 05/10/2019 CLINICAL DATA:  Shortness of breath EXAM: PORTABLE CHEST 1 VIEW COMPARISON:  04/19/2019 FINDINGS: Heart size is enlarged. There are prominent interstitial lung markings bilaterally. No pneumothorax. Pulmonary arteries appear to be dilated which can be seen in patients with elevated pulmonary artery pressures. There are small bilateral pleural effusions. There is no acute osseous abnormality. IMPRESSION: 1. Cardiomegaly with findings consistent with congestive heart failure/volume overload. 2. Dilated pulmonary arteries which can be seen in patients with elevated pulmonary artery pressures. This is similar to recent prior CT dated 04/19/2019 given differences in technique. Electronically Signed   By: Constance Holster M.D.   On: 05/10/2019 09:14   Ct Angio Chest/abd/pel For Dissection W And/or W/wo  Result Date: 04/19/2019 CLINICAL DATA:  Epigastric pain. EXAM: CT ANGIOGRAPHY CHEST, ABDOMEN AND PELVIS TECHNIQUE: Multidetector CT imaging through the chest, abdomen and pelvis was performed using the standard protocol during bolus administration of intravenous contrast. Multiplanar reconstructed images and MIPs were obtained and reviewed to evaluate the vascular  anatomy. CONTRAST:  177mL OMNIPAQUE IOHEXOL 350 MG/ML SOLN COMPARISON:  CT abdomen pelvis dated October 02, 2018. CTA chest dated August 25, 2018. FINDINGS: CTA CHEST FINDINGS Cardiovascular: Preferential opacification of the thoracic aorta. No evidence of thoracic aortic dissection. Unchanged ascending thoracic aortic aneurysm measuring 4.0 cm. Unchanged moderate cardiomegaly. No pericardial effusion. No central pulmonary embolism. Mediastinum/Nodes: No pathologically enlarged mediastinal, hilar, or axillary lymph nodes. Unchanged subcentimeter mediastinal and hilar lymph nodes, likely reactive. The thyroid gland, trachea, and esophagus demonstrate no significant findings. Lungs/Pleura: New trace left pleural effusion. Scattered mild interlobular septal thickening throughout both lungs. Minimal subsegmental atelectasis in both lower lobes. No focal consolidation or pneumothorax. No suspicious pulmonary nodule. Musculoskeletal: No chest wall abnormality. No acute or significant osseous findings. Review of the MIP images confirms the  above findings. CTA ABDOMEN AND PELVIS FINDINGS VASCULAR Aorta: Normal caliber aorta without aneurysm, dissection, vasculitis or significant stenosis. Celiac: Patent without evidence of aneurysm, dissection, vasculitis or significant stenosis. SMA: Patent without evidence of aneurysm, dissection, vasculitis or significant stenosis. Replaced right hepatic artery. Renals: Both renal arteries are patent without evidence of aneurysm, dissection, vasculitis, fibromuscular dysplasia or significant stenosis. IMA: Patent without evidence of aneurysm, dissection, vasculitis or significant stenosis. Inflow: Patent without evidence of aneurysm, dissection, vasculitis or significant stenosis. Veins: No obvious venous abnormality within the limitations of this arterial phase study. Review of the MIP images confirms the above findings. NON-VASCULAR Hepatobiliary: No focal liver abnormality is seen.  Status post cholecystectomy. No biliary dilatation. Pancreas: Unremarkable. No pancreatic ductal dilatation or surrounding inflammatory changes. Spleen: Normal in size without focal abnormality. Adrenals/Urinary Tract: Adrenal glands are unremarkable. Kidneys are normal, without renal calculi, focal lesion, or hydronephrosis. Bladder is decompressed. Stomach/Bowel: Stomach is within normal limits. Appendix appears normal. No evidence of bowel wall thickening, distention, or inflammatory changes. Lymphatic: No enlarged abdominal or pelvic lymph nodes. Reproductive: Status post hysterectomy. No adnexal masses. Other: No abdominal wall hernia or abnormality. No abdominopelvic ascites. No pneumoperitoneum. Musculoskeletal: No acute or significant osseous findings. Review of the MIP images confirms the above findings. IMPRESSION: Vascular: 1. No evidence of acute aortic syndrome. 2. Unchanged ascending thoracic aortic aneurysm measuring 4.0 cm. Recommend annual imaging followup by CTA or MRA. This recommendation follows 2010 ACCF/AHA/AATS/ACR/ASA/SCA/SCAI/SIR/STS/SVM Guidelines for the Diagnosis and Management of Patients with Thoracic Aortic Disease. Circulation. 2010; [id number]. Aortic aneurysm NOS (ICD10-I71.9) 3.  Aortic atherosclerosis (ICD10-I70.0). Chest: 1. Mild interstitial pulmonary edema and trace left pleural effusion. 2. Unchanged moderate cardiomegaly. Abdomen and pelvis: 1.  No acute intra-abdominal process. Electronically Signed   By: Titus Dubin M.D.   On: 04/19/2019 16:49     CBC Recent Labs  Lab 05/10/19 0848 05/11/19 0530  WBC 7.7 6.7  HGB 13.1 12.6  HCT 41.6 40.0  PLT 271 238  MCV 87.9 88.1  MCH 27.7 27.8  MCHC 31.5 31.5  RDW 13.2 13.1  LYMPHSABS 2.4  --   MONOABS 0.5  --   EOSABS 0.1  --   BASOSABS 0.0  --     Chemistries  Recent Labs  Lab 05/10/19 0848 05/11/19 0530 05/11/19 0813 05/12/19 0819 05/13/19 0622  NA 137 136  --  135 135  K 4.0 3.7  --  4.0 4.1    CL 105 102  --  98 101  CO2 22 27  --  25 23  GLUCOSE 268* 302*  --  244* 222*  BUN 13 15  --  19 25*  CREATININE 0.79 0.84  --  0.88 0.96  CALCIUM 8.8* 9.3  --  9.3 9.2  MG  --   --  2.1  --   --   AST 18  --   --   --   --   ALT 18  --   --   --   --   ALKPHOS 117  --   --   --   --   BILITOT 0.8  --   --   --   --    ------------------------------------------------------------------------------------------------------------------ No results for input(s): CHOL, HDL, LDLCALC, TRIG, CHOLHDL, LDLDIRECT in the last 72 hours.  Lab Results  Component Value Date   HGBA1C 12.0 (H) 05/10/2019   ------------------------------------------------------------------------------------------------------------------ No results for input(s): TSH, T4TOTAL, T3FREE, THYROIDAB in the last 72 hours.  Invalid input(s):  FREET3 ------------------------------------------------------------------------------------------------------------------ No results for input(s): VITAMINB12, FOLATE, FERRITIN, TIBC, IRON, RETICCTPCT in the last 72 hours.  Coagulation profile No results for input(s): INR, PROTIME in the last 168 hours.  No results for input(s): DDIMER in the last 72 hours.  Cardiac Enzymes No results for input(s): CKMB, TROPONINI, MYOGLOBIN in the last 168 hours.  Invalid input(s): CK ------------------------------------------------------------------------------------------------------------------    Component Value Date/Time   BNP 903.0 (H) 05/10/2019 [id number]   Roxan Hockey M.D on 05/13/2019 at 5:16 PM  Go to [email or web address] - for contact info  [facility] - Office  [phone]

## 2019-05-13 NOTE — Progress Notes (Signed)
Progress Note  Patient Name: Robin [name] Date of Encounter: 05/13/2019  Primary Cardiologist: Carlyle Dolly, MD   Subjective   Denies chest pain and shortness of breath. Wants to go home.  Inpatient Medications    Scheduled Meds: . apixaban  5 mg Oral BID  . carvedilol  37.5 mg Oral BID WC  . furosemide  40 mg Intravenous BID  . gabapentin  100 mg Oral TID  . hydrALAZINE  10 mg Oral BID  . insulin aspart  0-5 Units Subcutaneous QHS  . insulin aspart  0-9 Units Subcutaneous TID WC  . insulin aspart protamine- aspart  15 Units Subcutaneous BID WC  . linaclotide  145 mcg Oral QAC breakfast  . metFORMIN  1,000 mg Oral BID WC  . pantoprazole  40 mg Oral Daily  . potassium chloride SA  20 mEq Oral BID  . sodium chloride flush  3 mL Intravenous Q12H   Continuous Infusions: . sodium chloride     PRN Meds: sodium chloride, acetaminophen **OR** acetaminophen, albuterol, ondansetron **OR** ondansetron (ZOFRAN) IV, polyethylene glycol, sodium chloride flush, traZODone   Vital Signs    Vitals:   05/12/19 1229 05/12/19 1956 05/12/19 2117 05/13/19 0457  BP: 136/89  (!) 129/92 122/84  Pulse: (!) 105  (!) 103 92  Resp: 18  16 16   Temp: 98 F (36.7 C)   97.8 F (36.6 C)  TempSrc: Oral   Oral  SpO2: 90% 90% 90% 97%  Weight:    97.5 kg  Height:        Intake/Output Summary (Last 24 hours) at 05/13/2019 1106 Last data filed at 05/13/2019 0900 Gross per 24 hour  Intake 840 ml  Output 2300 ml  Net -1460 ml   Filed Weights   05/11/19 0636 05/12/19 0454 05/13/19 0457  Weight: 98.3 kg 97.7 kg 97.5 kg    Telemetry    Atrial fibrillation, HR 110-115 bpm - Personally Reviewed  ECG    ECG performed on 05/12/2019 demonstrated atrial fibrillation with late R wave transition, heart rate 98 bpm- Personally Reviewed  Physical Exam   GEN: No acute distress.   Neck: No JVD Cardiac:  Irregularly irregular, no murmurs, rubs, or gallops.  Respiratory: Clear to auscultation  bilaterally. GI: Soft, nontender, non-distended  MS: No edema; No deformity. Neuro:  Nonfocal  Psych: Normal affect   Labs    Chemistry Recent Labs  Lab 05/10/19 0848 05/11/19 0530 05/12/19 0819 05/13/19 0622  NA 137 136 135 135  K 4.0 3.7 4.0 4.1  CL 105 102 98 101  CO2 22 27 25 23   GLUCOSE 268* 302* 244* 222*  BUN 13 15 19  25*  CREATININE 0.79 0.84 0.88 0.96  CALCIUM 8.8* 9.3 9.3 9.2  PROT 6.9  --   --   --   ALBUMIN 3.6  --   --   --   AST 18  --   --   --   ALT 18  --   --   --   ALKPHOS 117  --   --   --   BILITOT 0.8  --   --   --   GFRNONAA >60 >60 >60 >60  GFRAA >60 >60 >60 >60  ANIONGAP 10 7 12 11      Hematology Recent Labs  Lab 05/10/19 0848 05/11/19 0530  WBC 7.7 6.7  RBC 4.73 4.54  HGB 13.1 12.6  HCT 41.6 40.0  MCV 87.9 88.1  MCH 27.7 27.8  MCHC  31.5 31.5  RDW 13.2 13.1  PLT 271 238    Cardiac EnzymesNo results for input(s): TROPONINI in the last 168 hours. No results for input(s): TROPIPOC in the last 168 hours.   BNP Recent Labs  Lab 05/10/19 0848  BNP 903.0*     DDimer No results for input(s): DDIMER in the last 168 hours.   Radiology    No results found.  Cardiac Studies   Echocardiogram: 05/11/2019 IMPRESSIONS   1. Left ventricular ejection fraction, by visual estimation, is 35%. The left ventricle has moderate to severely decreased function. Normal left ventricular size. There is moderately increased left ventricular hypertrophy. 2. Elevated left ventricular end-diastolic pressure. 3. Left ventricular diastolic function could not be evaluated pattern of LV diastolic filling. 4. LV diffusely hypokinetic. 5. Global right ventricle has moderately reduced systolic function.The right ventricular size is normal. Mildly increased right ventricular wall thickness. 6. Left atrial size was mildly dilated. 7. Right atrial size was mildly dilated. 8. Moderate aortic valve annular calcification. 9. The mitral valve is  grossly normal. Mild mitral valve regurgitation. 10. The tricuspid valve is grossly normal. Tricuspid valve regurgitation mild-moderate. 11. The aortic valve is tricuspid Aortic valve regurgitation was not visualized by color flow Doppler. Mild aortic valve stenosis. 12. The pulmonic valve was not well visualized. Pulmonic valve regurgitation is not visualized by color flow Doppler. 13. Normal pulmonary artery systolic pressure. 14. The inferior vena cava is dilated in size with >50% respiratory variability, suggesting right atrial pressure of 8 mmHg.  Patient Profile     53 y.o. female with past medical history of chronic systolic CHF (EF at [id number] by echo in 08/2018),HTN, COPD,uncontrolled IDDM,thoracic aortic aneurysm(at 4.0 cm by imaging in 04/2019), history of normal cors by cath in 2010, recently diagnosed atrial fibrillation (as of 04/19/2019)  and prior substance abuse who is currently admitted for atrial fibrillation with RVR and CHF exacerbation.   Assessment & Plan    1. Acute on Chronic Systolic CHF - repeat echo this admission shows slight improvement in her EF from 30% in 08/2018 to 35% this admission. Over 1.4 L output in last 24+ hrs. BUN up to 25 indicating euvolemia. I will stop IV Lasix and switch to torsemide 20 mg bid starting on 10/9. Coreg increased to 37.5 mg bid today.   Intolerant to Lisinopril (reported sinus congestion) and Losartan (reported epistaxis), therefore we have tried low-dose Hydralazine and plan to add Isordil at the time of follow-up if BP allows.  I will give IV digoxin and consider oral at time of discharge if HR remains suboptimally controlled.  2. Atrial Fibrillation with RVR Coreg increased to 37.5 mg bid today as HR's remain elevated. Continue Eliquis 5mg  BID for now. If compliant with this as an outpatient, consider DCCV in 3 weeks if still in atrial fibrillation. I will give IV digoxin and consider oral at time of discharge if HR remains  suboptimally controlled.  3. Thoracic Aortic Aneurysm -at 4.0 cm by imaging in 04/2019. Continue with plan for annual imaging as an outpatient.   4. Atypical Chest Pain -Troponin values have been negative at 17 and 18 this admission. EKG without acute ischemic changes and she denies any recurrent symptoms. No plans for ischemic testing as an inpatient.   5. Substance Abuse -History of cocaine and opiate abuse. UDS on 04/19/2019 was positive for both yet she denies any cocaine use since 08/2018. Advised against recurrent use.     For questions or updates, please  contact [place] Please consult [email or web address] for contact info under Cardiology/STEMI.      Signed, Kate Sable, MD  05/13/2019, 11:06 AM

## 2019-05-14 LAB — BASIC METABOLIC PANEL WITH GFR
Anion gap: 9 (ref 5–15)
BUN: 25 mg/dL — ABNORMAL HIGH (ref 6–20)
CO2: 21 mmol/L — ABNORMAL LOW (ref 22–32)
Calcium: 9.2 mg/dL (ref 8.9–10.3)
Chloride: 103 mmol/L (ref 98–111)
Creatinine, Ser: 0.96 mg/dL (ref 0.44–1.00)
GFR calc Af Amer: >60 mL/min
GFR calc non Af Amer: >60 mL/min
Glucose, Bld: 327 mg/dL — ABNORMAL HIGH (ref 70–99)
Potassium: 4.6 mmol/L (ref 3.5–5.1)
Sodium: 133 mmol/L — ABNORMAL LOW (ref 135–145)

## 2019-05-14 LAB — GLUCOSE, CAPILLARY
Glucose-Capillary: 221 mg/dL — ABNORMAL HIGH (ref 70–99)
Glucose-Capillary: 255 mg/dL — ABNORMAL HIGH (ref 70–99)

## 2019-05-14 MED ORDER — INSULIN ASPART PROT & ASPART (70-30 MIX) 100 UNIT/ML ~~LOC~~ SUSP: SUBCUTANEOUS | 11 refills | Status: DC

## 2019-05-14 MED ORDER — ALBUTEROL SULFATE (2.5 MG/3ML) 0.083% IN NEBU: 2.5000 mg | INHALATION_SOLUTION | Freq: Four times a day (QID) | RESPIRATORY_TRACT | 5 refills | Status: DC | PRN

## 2019-05-14 MED ORDER — ASPIRIN EC 81 MG PO TBEC: 81.0000 mg | DELAYED_RELEASE_TABLET | Freq: Every day | ORAL | 0 refills | Status: DC

## 2019-05-14 MED ORDER — HYDRALAZINE HCL 25 MG PO TABS
50.0000 mg | ORAL_TABLET | Freq: Once | ORAL | Status: AC
  Administered 2019-05-14: 50 mg via ORAL
  Filled 2019-05-14: qty 2

## 2019-05-14 MED ORDER — CARVEDILOL 25 MG PO TABS: 37.5000 mg | ORAL_TABLET | Freq: Two times a day (BID) | ORAL | 5 refills | Status: DC

## 2019-05-14 MED ORDER — HYDRALAZINE HCL 25 MG PO TABS: 25.0000 mg | ORAL_TABLET | Freq: Three times a day (TID) | ORAL | Status: DC

## 2019-05-14 MED ORDER — TORSEMIDE 20 MG PO TABS: 20.0000 mg | ORAL_TABLET | Freq: Two times a day (BID) | ORAL | 5 refills | Status: DC

## 2019-05-14 MED ORDER — HYDRALAZINE HCL 50 MG PO TABS: 50.0000 mg | ORAL_TABLET | Freq: Three times a day (TID) | ORAL | 5 refills | Status: DC

## 2019-05-14 MED ORDER — POTASSIUM CHLORIDE CRYS ER 20 MEQ PO TBCR: 20.0000 meq | EXTENDED_RELEASE_TABLET | Freq: Two times a day (BID) | ORAL | 3 refills | Status: DC

## 2019-05-14 MED ORDER — DIGOXIN 125 MCG PO TABS: 0.1250 mg | ORAL_TABLET | Freq: Every day | ORAL | 5 refills | Status: DC

## 2019-05-14 MED ORDER — ALBUTEROL SULFATE HFA 108 (90 BASE) MCG/ACT IN AERS: 2.0000 | INHALATION_SPRAY | Freq: Four times a day (QID) | RESPIRATORY_TRACT | 5 refills | Status: DC | PRN

## 2019-05-14 MED ORDER — ACETAMINOPHEN 325 MG PO TABS: 650.0000 mg | ORAL_TABLET | Freq: Four times a day (QID) | ORAL | 0 refills | Status: DC | PRN

## 2019-05-14 MED ORDER — GABAPENTIN 100 MG PO CAPS: 100.0000 mg | ORAL_CAPSULE | Freq: Three times a day (TID) | ORAL | 5 refills | Status: DC

## 2019-05-14 MED ORDER — APIXABAN 5 MG PO TABS: 5.0000 mg | ORAL_TABLET | Freq: Two times a day (BID) | ORAL | 5 refills | Status: DC

## 2019-05-14 MED ORDER — PANTOPRAZOLE SODIUM 40 MG PO TBEC: 40.0000 mg | DELAYED_RELEASE_TABLET | Freq: Every day | ORAL | 5 refills | Status: DC

## 2019-05-14 MED ORDER — METFORMIN HCL 1000 MG PO TABS: 1000.0000 mg | ORAL_TABLET | Freq: Two times a day (BID) | ORAL | 5 refills | Status: DC

## 2019-05-14 NOTE — Progress Notes (Signed)
Inpatient Diabetes Program Recommendations  AACE/ADA: New Consensus Statement on Inpatient Glycemic Control (2015)  Target Ranges:  Prepandial:   less than 140 mg/dL      Peak postprandial:   less than 180 mg/dL (1-2 hours)      Critically ill patients:  140 - 180 mg/dL   Lab Results  Component Value Date   GLUCAP 221 (H) 05/14/2019   HGBA1C 12.0 (H) 05/10/2019    Review of Glycemic Control  CBGs over past 24H:  183-316 mg/dL. Needs insulin titration.  Inpatient Diabetes Program Recommendations:     Increase 70/30 to 18 units bid.  Continue to monitor glucose trends.  Thank you. Lorenda Peck, RD, LDN, CDE Inpatient Diabetes Coordinator [phone]

## 2019-05-14 NOTE — Discharge Instructions (Signed)
1)Very low-salt diet advised (avoid Fat back, bacon, ham, sausage, hot dogs) 2)Weigh yourself daily, call if you gain more than 3 pounds in 1 day or more than 5 pounds in 1 week as your diuretic medications may need to be adjusted 3)Limit your Fluid  intake to no more than 60 ounces (1.8 Liters) per day 4)You are taking Eliquis/apixaban for blood thinner to prevent stroke also Avoid ibuprofen/Advil/Aleve/Motrin/Goody Powders/Naproxen/BC powders/Meloxicam/Diclofenac/Indomethacin and other Nonsteroidal anti-inflammatory medications as these will make you more likely to bleed and can cause stomach ulcers, can also cause Kidney problems.  5) please take your medications as prescribed 6) follow-up with cardiologist Dr. Harl Bowie as scheduled within the next couple of weeks 7)Abstinence from cocaine strongly advised

## 2019-05-14 NOTE — Progress Notes (Addendum)
Progress Note  Patient Name: Robin [name] Date of Encounter: 05/14/2019  Primary Cardiologist: Carlyle Dolly, MD   Subjective   Not aware of the atrial fib at all. Breathing back to baseline, no chest pain.   Inpatient Medications    Scheduled Meds:  apixaban  5 mg Oral BID   carvedilol  37.5 mg Oral BID WC   digoxin  0.125 mg Oral Daily   gabapentin  100 mg Oral TID   hydrALAZINE  10 mg Oral BID   insulin aspart  0-5 Units Subcutaneous QHS   insulin aspart  0-9 Units Subcutaneous TID WC   insulin aspart protamine- aspart  15 Units Subcutaneous BID WC   linaclotide  145 mcg Oral QAC breakfast   metFORMIN  1,000 mg Oral BID WC   pantoprazole  40 mg Oral Daily   potassium chloride SA  20 mEq Oral BID   sodium chloride flush  3 mL Intravenous Q12H   torsemide  20 mg Oral BID   Continuous Infusions:  sodium chloride     PRN Meds: sodium chloride, acetaminophen **OR** acetaminophen, albuterol, ondansetron **OR** ondansetron (ZOFRAN) IV, polyethylene glycol, sodium chloride flush, traZODone   Vital Signs    Vitals:   05/13/19 2121 05/14/19 0437 05/14/19 0505 05/14/19 0631  BP: 122/83  (!) 156/96 (!) 132/101  Pulse: 98  86 85  Resp: 16  16 16   Temp: 97.8 F (36.6 C)  97.8 F (36.6 C)   TempSrc:      SpO2: 96%  100% 100%  Weight:  97.6 kg    Height:        Intake/Output Summary (Last 24 hours) at 05/14/2019 0749 Last data filed at 05/13/2019 1700 Gross per 24 hour  Intake 840 ml  Output 2000 ml  Net -1160 ml   Filed Weights   05/12/19 0454 05/13/19 0457 05/14/19 0437  Weight: 97.7 kg 97.5 kg 97.6 kg    Telemetry    Atrial fib, no HR > 150, artifact causes machine to give falsely high readings, improved - Personally Reviewed  ECG    ECG performed on 05/12/2019 demonstrated atrial fibrillation with late R wave transition, heart rate 98 bpm- Personally Reviewed  Physical Exam   General: Well developed, well nourished, female in no  acute distress Head: Eyes PERRLA Normocephalic and atraumatic Lungs: Clear bilaterally to auscultation. Heart: Irreg R&R S1 S2, without rub or gallop. No murmur.  Pulses are 2+ & equal. No JVD. Abdomen: Bowel sounds are present, abdomen soft and non-tender without masses or  hernias noted. Msk: Normal strength and tone for age. Extremities: No clubbing, cyanosis or edema.    Skin:  No rashes or lesions noted. Neuro: Alert and oriented X 3. Psych:  Good affect, responds appropriately  Labs    Chemistry Recent Labs  Lab 05/10/19 0848 05/11/19 0530 05/12/19 0819 05/13/19 0622  NA 137 136 135 135  K 4.0 3.7 4.0 4.1  CL 105 102 98 101  CO2 22 27 25 23   GLUCOSE 268* 302* 244* 222*  BUN 13 15 19  25*  CREATININE 0.79 0.84 0.88 0.96  CALCIUM 8.8* 9.3 9.3 9.2  PROT 6.9  --   --   --   ALBUMIN 3.6  --   --   --   AST 18  --   --   --   ALT 18  --   --   --   ALKPHOS 117  --   --   --  BILITOT 0.8  --   --   --   GFRNONAA >60 >60 >60 >60  GFRAA >60 >60 >60 >60  ANIONGAP 10 7 12 11      Hematology Recent Labs  Lab 05/10/19 0848 05/11/19 0530  WBC 7.7 6.7  RBC 4.73 4.54  HGB 13.1 12.6  HCT 41.6 40.0  MCV 87.9 88.1  MCH 27.7 27.8  MCHC 31.5 31.5  RDW 13.2 13.1  PLT 271 238    Cardiac Enzymes High Sensitivity Troponin:   Recent Labs  Lab 04/19/19 1235 04/19/19 1437 05/10/19 0848 05/10/19 1039 05/10/19 2146  TROPONINIHS 25* 28* 18* 17 19*     BNP Recent Labs  Lab 05/10/19 0848  BNP 903.0*    No results found for: DIGOXIN   Radiology    No results found.  Cardiac Studies   Echocardiogram: 05/11/2019 IMPRESSIONS   1. Left ventricular ejection fraction, by visual estimation, is 35%. The left ventricle has moderate to severely decreased function. Normal left ventricular size. There is moderately increased left ventricular hypertrophy. 2. Elevated left ventricular end-diastolic pressure. 3. Left ventricular diastolic function could not be evaluated  pattern of LV diastolic filling. 4. LV diffusely hypokinetic. 5. Global right ventricle has moderately reduced systolic function.The right ventricular size is normal. Mildly increased right ventricular wall thickness. 6. Left atrial size was mildly dilated. 7. Right atrial size was mildly dilated. 8. Moderate aortic valve annular calcification. 9. The mitral valve is grossly normal. Mild mitral valve regurgitation. 10. The tricuspid valve is grossly normal. Tricuspid valve regurgitation mild-moderate. 11. The aortic valve is tricuspid Aortic valve regurgitation was not visualized by color flow Doppler. Mild aortic valve stenosis. 12. The pulmonic valve was not well visualized. Pulmonic valve regurgitation is not visualized by color flow Doppler. 13. Normal pulmonary artery systolic pressure. 14. The inferior vena cava is dilated in size with >50% respiratory variability, suggesting right atrial pressure of 8 mmHg.  Patient Profile     53 y.o. female with past medical history of chronic systolic CHF (EF at [id number] by echo in 08/2018),HTN, COPD,uncontrolled IDDM,thoracic aortic aneurysm(at 4.0 cm by imaging in 04/2019), history of normal cors by cath in 2010, recently diagnosed atrial fibrillation (as of 04/19/2019)  and prior substance abuse who is currently admitted for atrial fibrillation with RVR and CHF exacerbation.   Assessment & Plan    1. Acute on Chronic Systolic CHF - EF went from 30>>35% this admit - wt down 4 lbs and sx have dramatically improved - discussed daily wts and low Na diet - pt changed to po torsemide 20 mg qd 10/09 - recheck BMET in am - intol ACE/ARB - started on BB w/ uptitration of dose to Coreg 37.5 mg bid - also on hydralazine 10 mg bid, will increase to q 8 hr and add Imdur 30 mg qd  2. Atrial Fibrillation with RVR - Coreg up-titrated to 37.5 mg bid w/ improvement in rate but still not well controlled, discuss w/ MD if pt would benefit from increase  to 50 mg bid - Digoxin added 10/08 w/ 0.75 mg load, now on 0.125 mg qd - Continue Eliquis for anticoag - mild bi-atrial dilatation on echo, could do DCCV in 3 weeks if still in Afib  3. Thoracic Aortic Aneurysm - repeat imaging 04/2020 - 4.0 cm 04/2019  4. Atypical Chest Pain - CP w/ certain movements in the setting of sig volume overload - ECG and trop not acute - no further testing  5. Substance  Abuse - pt denies but UDS was + cocaine and opiates 04/19/2019 - needs to stop for good  For questions or updates, please contact [place] Please consult [email or web address] for contact info under Cardiology/STEMI.      Signed, Rosaria Ferries, PA-C  05/14/2019, 7:49 AM    The patient was seen and examined, and I agree with the history, physical exam, assessment and plan as documented above, with modifications as noted below. I have also personally reviewed all relevant documentation, old records, labs, and both radiographic and cardiovascular studies. I have also independently interpreted old and new ECG's.  She is feeling very well this morning and denies chest pain, palpitations, leg swelling, and shortness of breath.  She is eager to go home.  She remains hypertensive.  She is on hydralazine 10 mg twice daily.  I will increase to 25 mg 3 times daily.  Nitrates can be added in the outpatient setting.  No basic metabolic panel ordered for today so I will order.  She got 1 dose of IV Lasix yesterday morning and start torsemide 20 mg twice daily today.  BUN was elevated yesterday.  I gave IV digoxin yesterday and she is on oral digoxin today.  Heart rates are better controlled.  She has been intolerant to lisinopril and losartan in the past.  She is stable for discharge from my standpoint.  Could consider DCCV in 3 weeks if still in atrial fibrillation and she remains compliant with anticoagulation.   Kate Sable, MD, [facility]  05/14/2019 10:22 AM

## 2019-05-14 NOTE — Progress Notes (Signed)
Nsg Discharge Note  Admit Date:  05/10/2019 Discharge date: 05/14/2019   Izora Gala A [place] to be D/C'd Home per MD order.  AVS completed.  Patient able to verbalize understanding.  Discharge Medication: Allergies as of 05/14/2019      Reactions   Bee Venom Shortness Of Breath, Swelling   Bodily Swelling   Losartan Other (See Comments)   Nosebleeds per patient report.    Naproxen Other (See Comments)   Effects acid reflux   Penicillins Nausea Only   Has patient had a PCN reaction causing immediate rash, facial/tongue/throat swelling, SOB or lightheadedness with hypotension: no Has patient had a PCN reaction causing severe rash involving mucus membranes or skin necrosis: no Has patient had a PCN reaction that required hospitalization no Has patient had a PCN reaction occurring within the last 10 years: no If all of the above answers are "NO", then may proceed with Cephalosporin    Lisinopril    Sinus congestion      Medication List    STOP taking these medications   acetaminophen 650 MG CR tablet Commonly known as: Tylenol 8 Hour Replaced by: acetaminophen 325 MG tablet   aspirin EC 81 MG tablet   furosemide 20 MG tablet Commonly known as: LASIX   ibuprofen 200 MG tablet Commonly known as: ADVIL   linaclotide 145 MCG Caps capsule Commonly known as: LINZESS   omeprazole 40 MG capsule Commonly known as: PRILOSEC Replaced by: pantoprazole 40 MG tablet     TAKE these medications   acetaminophen 325 MG tablet Commonly known as: TYLENOL Take 2 tablets (650 mg total) by mouth every 6 (six) hours as needed for mild pain, fever or headache (or Fever >/= 101). Replaces: acetaminophen 650 MG CR tablet   albuterol (2.5 MG/3ML) 0.083% nebulizer solution Commonly known as: PROVENTIL Take 3 mLs (2.5 mg total) by nebulization every 6 (six) hours as needed for wheezing or shortness of breath. What changed: Another medication with the same name was changed. Make sure you understand  how and when to take each.   albuterol 108 (90 Base) MCG/ACT inhaler Commonly known as: VENTOLIN HFA Inhale 2 puffs into the lungs every 6 (six) hours as needed for wheezing or shortness of breath. What changed: how much to take   apixaban 5 MG Tabs tablet Commonly known as: ELIQUIS Take 1 tablet (5 mg total) by mouth 2 (two) times daily.   carvedilol 25 MG tablet Commonly known as: COREG Take 1.5 tablets (37.5 mg total) by mouth 2 (two) times daily with a meal. What changed:   medication strength  how much to take   digoxin 0.125 MG tablet Commonly known as: LANOXIN Take 1 tablet (0.125 mg total) by mouth daily. Start taking on: May 15, 2019   gabapentin 100 MG capsule Commonly known as: NEURONTIN Take 1 capsule (100 mg total) by mouth 3 (three) times daily.   hydrALAZINE 50 MG tablet Commonly known as: APRESOLINE Take 1 tablet (50 mg total) by mouth 3 (three) times daily.   insulin aspart protamine- aspart (70-30) 100 UNIT/ML injection Commonly known as: NovoLOG Mix 70/30 Take 18 units 2 times a day with meals What changed: additional instructions   metFORMIN 1000 MG tablet Commonly known as: Glucophage Take 1 tablet (1,000 mg total) by mouth 2 (two) times daily with a meal.   pantoprazole 40 MG tablet Commonly known as: PROTONIX Take 1 tablet (40 mg total) by mouth daily. Start taking on: May 15, 2019 Replaces: omeprazole 40  MG capsule   potassium chloride SA 20 MEQ tablet Commonly known as: KLOR-CON Take 1 tablet (20 mEq total) by mouth 2 (two) times daily.   torsemide 20 MG tablet Commonly known as: DEMADEX Take 1 tablet (20 mg total) by mouth 2 (two) times daily.       Discharge Assessment: Vitals:   05/14/19 0829 05/14/19 1100  BP: (!) 159/106 121/82  Pulse: 87 77  Resp:  18  Temp:  98.2 F (36.8 C)  SpO2:  100%   Skin clean, dry and intact without evidence of skin break down, no evidence of skin tears noted. IV catheter discontinued  intact. Site without signs and symptoms of complications - no redness or edema noted at insertion site, patient denies c/o pain - only slight tenderness at site.  Dressing with slight pressure applied.  D/c Instructions-Education: Discharge instructions given to patient/family with verbalized understanding. D/c education completed with patient including follow up instructions, medication list, d/c activities limitations if indicated, with other d/c instructions as indicated by MD - patient able to verbalize understanding, all questions fully answered. Patient instructed to return to ED, call 911, or call MD for any changes in condition.  Patient escorted via [place], and D/C home via private auto.  Berton Bon, RN 05/14/2019 5:38 PM

## 2019-05-14 NOTE — Discharge Summary (Addendum)
Robin [name], is a 53 y.o. female  DOB 1965-10-21  MRN [id number].  Admission date:  05/10/2019  Admitting Physician  Roxan Hockey, MD  Discharge Date:  05/14/2019   Primary MD  Inda Coke, PA  Recommendations for primary care physician for things to follow:   - 1)Very low-salt diet advised (avoid Fat back, bacon, ham, sausage, hot dogs) 2)Weigh yourself daily, call if you gain more than 3 pounds in 1 day or more than 5 pounds in 1 week as your diuretic medications may need to be adjusted 3)Limit your Fluid  intake to no more than 60 ounces (1.8 Liters) per day 4)You are taking Eliquis/apixaban for blood thinner to prevent stroke also Avoid ibuprofen/Advil/Aleve/Motrin/Goody Powders/Naproxen/BC powders/Meloxicam/Diclofenac/Indomethacin and other Nonsteroidal anti-inflammatory medications as these will make you more likely to bleed and can cause stomach ulcers, can also cause Kidney problems.  5) please take your medications as prescribed 6) follow-up with cardiologist Dr. Harl Bowie as scheduled within the next couple of weeks 7)Abstinence from cocaine strongly advised  Admission Diagnosis  Systolic congestive heart failure, unspecified HF chronicity ([place]) [I50.20]   Discharge Diagnosis  Systolic congestive heart failure, unspecified HF chronicity ([place]) [I50.20]    Principal Problem:   Acute exacerbation of CHF (congestive heart failure) ([place]) Active Problems:   Respiratory failure with hypoxia (HCC)   COPD (chronic obstructive pulmonary disease) (HCC)   Atrial fibrillation with RVR (HCC)   Acute on chronic systolic heart failure (HCC)   Thoracic aortic aneurysm without rupture ([place])   Substance abuse ([place])      Past Medical History:  Diagnosis Date  . Allergy   . Anemia   . Arthritis   . CHF (congestive heart failure) ([place])    a. EF at 30-35% by echo in 08/2018  . Chronic abdominal  pain   . COPD (chronic obstructive pulmonary disease) ([place])   . Drug use   . Essential hypertension, benign   . GERD (gastroesophageal reflux disease)   . Gout 2016  . Normal coronary arteries    3/10 - following abnormal Myoview  . Ovarian cyst   . Type 2 diabetes mellitus ([place])     Past Surgical History:  Procedure Laterality Date  . ABDOMINAL HYSTERECTOMY  09/10/2011   Procedure: HYSTERECTOMY ABDOMINAL;  Surgeon: Jonnie Kind, MD;  Location: [facility];  Service: Gynecology;  Laterality: N/A;  Abdominal hysterectomy  . CESAREAN SECTION  [id number], and 1994  . CHOLECYSTECTOMY  1995  . SCAR REVISION  09/10/2011   Procedure: SCAR REVISION;  Surgeon: Jonnie Kind, MD;  Location: [facility];  Service: Gynecology;  Laterality: N/A;  Wide Excision of old Cicatrix  . TUBAL LIGATION  1994     HPI  from the history and physical done on the day of admission:   - Robin [name]  is a 53 y.o. female  with a hx of HFrEF w/ last EF 30-35%, COPD, HTN, T2DM, GERD, cocaine abuse, & chronic abdominal pain w/ prior hysterectomy, cholecystectomy, & c-section as well as  history of polysubstance abuse including cocaine presents to ED with chest discomfort and shortness of breath.   -Chest discomfort was not exertional, patient has palpitations and dizziness as well, no pleuritic symptoms, -No fevers no chills, no productive cough -She is found to be in A. fib with RVR -Initially started on IV Cardizem drip with good rate control however patient's EF is 30 to 35% so given negative UDS for cocaine would prefer beta-blocker for rate control at this time   -Patient reports dyspnea on exertion and orthopnea PTA  In ED--- BNP is elevated at 903 which is higher than her previous baseline, chest x-ray consistent with CHF, creatinine 0.79, TSH is 1.9 and troponins are not elevated      Hospital Course:   - 1)HFrEF--acute exacerbation of systolic dysfunction CHF, last known EF30to 35%, suspect  cardiomyopathy due to cocaine/substance abuse,CHF flareup at this time is due to A. fib with RVR  -On admission BNP was903 which is way higher than herprevious baseline and chest x-ray consistent with CHF -Initially diuresed with IV Lasix,  -Patient improved symptomatically weight down at least 4 pounds -Switched to p.o. torsemide 20 mg twice daily =--Patient admits to very high salt diet PTA, lifestyle and dietary modifications discussed -Discharge on hydralazine 50 mg 3 times daily, cardiologist will consider adding isosorbide -Patient is apparently intolerant to ACEI/ARB -Clinically much improved, post ambulation O2 sats 98% on room air  2)Chronic Afib now with RVR-Initially started on IV Cardizem drip with good rate control however patient's EF is 30 to 35% so given negative UDS for cocaine would prefer beta-blockerover CCBfor rate control at this time. -Eliquis for stroke prophylaxis as previously recommended but patient has not been compliant -Discharge on Coreg to 37.5 mg twice daily,  digoxin 0.125 mg for rate control -TSH is 1.9 -Cardiology consult appreciated -May be a candidate for cardioversion down the road if she can make compliant with anticoagulation -The need to be compliant with Eliquis emphasized to patient  3)atypical chest pain--due to #2 above, serial troponins negative.  ACS clinically unlikely  4)DM2-last A1c 12, reflecting uncontrolled diabetic due to noncompliance.-PTA patient was on 70/30 insulin, u Discharge on  70/30 insulin at 18 units twice daily to try to mimic patient's home dose -Continue metformin   5)history of polysubstance abuse including cocaine--UDS this time positive only for opiates negative for cocaine this time around--abstinence from cocaine strongly advised  6)HTN--stage II in the setting of noncompliance, should improve with hydralazine 50 mg 3 times daily, Coreg 37.5 mg twice daily, torsemide 20 mg twice daily, cardiologist will  consider adding isosorbide as outpatient   NB!! -Social work/case management consult for medication assistance appreciated  Disposition-HomeCode Status : FULL  Family Communication:   NA (patient is alert, awake and coherent)  Disposition Plan  : Home  Consults  : Cardiology  Discharge Condition: stable  Follow UP  Follow-up Information    Inda Coke, PA Follow up.   Specialty: Physician Assistant Why: follow up appt October 13 th at 9:20;  Contact information: [place] [address] [phone]        Arnoldo Lenis, MD .   Specialty: Cardiology Contact information: [address] [place] [address] [phone]             Diet and Activity recommendation:  As advised  Discharge Instructions    Discharge Instructions    Call MD for:  difficulty breathing, headache or visual disturbances   Complete  by: As directed    Call MD for:  persistant dizziness or light-headedness   Complete by: As directed    Call MD for:  persistant nausea and vomiting   Complete by: As directed    Call MD for:  severe uncontrolled pain   Complete by: As directed    Call MD for:  temperature >100.4   Complete by: As directed    Diet - low sodium heart healthy   Complete by: As directed    Diet Carb Modified   Complete by: As directed    Discharge instructions   Complete by: As directed    1)Very low-salt diet advised (avoid Fat back, bacon, ham, sausage, hot dogs) 2)Weigh yourself daily, call if you gain more than 3 pounds in 1 day or more than 5 pounds in 1 week as your diuretic medications may need to be adjusted 3)Limit your Fluid  intake to no more than 60 ounces (1.8 Liters) per day 4)You are taking Eliquis/apixaban for blood thinner to prevent stroke also Avoid ibuprofen/Advil/Aleve/Motrin/Goody Powders/Naproxen/BC powders/Meloxicam/Diclofenac/Indomethacin and other Nonsteroidal anti-inflammatory medications as these will make you  more likely to bleed and can cause stomach ulcers, can also cause Kidney problems.  5) please take your medications as prescribed 6) follow-up with cardiologist Dr. Harl Bowie as scheduled within the next couple of weeks 7)Abstinence from cocaine strongly advised   Increase activity slowly   Complete by: As directed         Discharge Medications     Allergies as of 05/14/2019      Reactions   Bee Venom Shortness Of Breath, Swelling   Bodily Swelling   Losartan Other (See Comments)   Nosebleeds per patient report.    Naproxen Other (See Comments)   Effects acid reflux   Penicillins Nausea Only   Has patient had a PCN reaction causing immediate rash, facial/tongue/throat swelling, SOB or lightheadedness with hypotension: no Has patient had a PCN reaction causing severe rash involving mucus membranes or skin necrosis: no Has patient had a PCN reaction that required hospitalization no Has patient had a PCN reaction occurring within the last 10 years: no If all of the above answers are "NO", then may proceed with Cephalosporin    Lisinopril    Sinus congestion      Medication List    STOP taking these medications   acetaminophen 650 MG CR tablet Commonly known as: Tylenol 8 Hour Replaced by: acetaminophen 325 MG tablet   aspirin EC 81 MG tablet   furosemide 20 MG tablet Commonly known as: LASIX   ibuprofen 200 MG tablet Commonly known as: ADVIL   linaclotide 145 MCG Caps capsule Commonly known as: LINZESS   omeprazole 40 MG capsule Commonly known as: PRILOSEC Replaced by: pantoprazole 40 MG tablet     TAKE these medications   acetaminophen 325 MG tablet Commonly known as: TYLENOL Take 2 tablets (650 mg total) by mouth every 6 (six) hours as needed for mild pain, fever or headache (or Fever >/= 101). Replaces: acetaminophen 650 MG CR tablet   albuterol (2.5 MG/3ML) 0.083% nebulizer solution Commonly known as: PROVENTIL Take 3 mLs (2.5 mg total) by nebulization every  6 (six) hours as needed for wheezing or shortness of breath. What changed: Another medication with the same name was changed. Make sure you understand how and when to take each.   albuterol 108 (90 Base) MCG/ACT inhaler Commonly known as: VENTOLIN HFA Inhale 2 puffs into the lungs every 6 (six) hours  as needed for wheezing or shortness of breath. What changed: how much to take   apixaban 5 MG Tabs tablet Commonly known as: ELIQUIS Take 1 tablet (5 mg total) by mouth 2 (two) times daily.   carvedilol 25 MG tablet Commonly known as: COREG Take 1.5 tablets (37.5 mg total) by mouth 2 (two) times daily with a meal. What changed:   medication strength  how much to take   digoxin 0.125 MG tablet Commonly known as: LANOXIN Take 1 tablet (0.125 mg total) by mouth daily. Start taking on: May 15, 2019   gabapentin 100 MG capsule Commonly known as: NEURONTIN Take 1 capsule (100 mg total) by mouth 3 (three) times daily.   hydrALAZINE 50 MG tablet Commonly known as: APRESOLINE Take 1 tablet (50 mg total) by mouth 3 (three) times daily.   insulin aspart protamine- aspart (70-30) 100 UNIT/ML injection Commonly known as: NovoLOG Mix 70/30 Take 18 units 2 times a day with meals What changed: additional instructions   metFORMIN 1000 MG tablet Commonly known as: Glucophage Take 1 tablet (1,000 mg total) by mouth 2 (two) times daily with a meal.   pantoprazole 40 MG tablet Commonly known as: PROTONIX Take 1 tablet (40 mg total) by mouth daily. Start taking on: May 15, 2019 Replaces: omeprazole 40 MG capsule   potassium chloride SA 20 MEQ tablet Commonly known as: KLOR-CON Take 1 tablet (20 mEq total) by mouth 2 (two) times daily.   torsemide 20 MG tablet Commonly known as: DEMADEX Take 1 tablet (20 mg total) by mouth 2 (two) times daily.       Major procedures and Radiology Reports - PLEASE review detailed and final reports for all details, in brief -    Dg Chest 2  View  Result Date: 04/19/2019 CLINICAL DATA:  Abdominal pain and dyspnea. EXAM: CHEST - 2 VIEW COMPARISON:  Chest radiograph dated 08/24/2018. FINDINGS: The heart remains enlarged. Both lungs are clear. The visualized skeletal structures are unremarkable. IMPRESSION: No active cardiopulmonary disease.  Cardiomegaly. Electronically Signed   By: Zerita Boers M.D.   On: 04/19/2019 14:09   Dg Chest Portable 1 View  Result Date: 05/10/2019 CLINICAL DATA:  Shortness of breath EXAM: PORTABLE CHEST 1 VIEW COMPARISON:  04/19/2019 FINDINGS: Heart size is enlarged. There are prominent interstitial lung markings bilaterally. No pneumothorax. Pulmonary arteries appear to be dilated which can be seen in patients with elevated pulmonary artery pressures. There are small bilateral pleural effusions. There is no acute osseous abnormality. IMPRESSION: 1. Cardiomegaly with findings consistent with congestive heart failure/volume overload. 2. Dilated pulmonary arteries which can be seen in patients with elevated pulmonary artery pressures. This is similar to recent prior CT dated 04/19/2019 given differences in technique. Electronically Signed   By: Constance Holster M.D.   On: 05/10/2019 09:14   Ct Angio Chest/abd/pel For Dissection W And/or W/wo  Result Date: 04/19/2019 CLINICAL DATA:  Epigastric pain. EXAM: CT ANGIOGRAPHY CHEST, ABDOMEN AND PELVIS TECHNIQUE: Multidetector CT imaging through the chest, abdomen and pelvis was performed using the standard protocol during bolus administration of intravenous contrast. Multiplanar reconstructed images and MIPs were obtained and reviewed to evaluate the vascular anatomy. CONTRAST:  175mL OMNIPAQUE IOHEXOL 350 MG/ML SOLN COMPARISON:  CT abdomen pelvis dated October 02, 2018. CTA chest dated August 25, 2018. FINDINGS: CTA CHEST FINDINGS Cardiovascular: Preferential opacification of the thoracic aorta. No evidence of thoracic aortic dissection. Unchanged ascending thoracic  aortic aneurysm measuring 4.0 cm. Unchanged moderate cardiomegaly. No pericardial effusion.  No central pulmonary embolism. Mediastinum/Nodes: No pathologically enlarged mediastinal, hilar, or axillary lymph nodes. Unchanged subcentimeter mediastinal and hilar lymph nodes, likely reactive. The thyroid gland, trachea, and esophagus demonstrate no significant findings. Lungs/Pleura: New trace left pleural effusion. Scattered mild interlobular septal thickening throughout both lungs. Minimal subsegmental atelectasis in both lower lobes. No focal consolidation or pneumothorax. No suspicious pulmonary nodule. Musculoskeletal: No chest wall abnormality. No acute or significant osseous findings. Review of the MIP images confirms the above findings. CTA ABDOMEN AND PELVIS FINDINGS VASCULAR Aorta: Normal caliber aorta without aneurysm, dissection, vasculitis or significant stenosis. Celiac: Patent without evidence of aneurysm, dissection, vasculitis or significant stenosis. SMA: Patent without evidence of aneurysm, dissection, vasculitis or significant stenosis. Replaced right hepatic artery. Renals: Both renal arteries are patent without evidence of aneurysm, dissection, vasculitis, fibromuscular dysplasia or significant stenosis. IMA: Patent without evidence of aneurysm, dissection, vasculitis or significant stenosis. Inflow: Patent without evidence of aneurysm, dissection, vasculitis or significant stenosis. Veins: No obvious venous abnormality within the limitations of this arterial phase study. Review of the MIP images confirms the above findings. NON-VASCULAR Hepatobiliary: No focal liver abnormality is seen. Status post cholecystectomy. No biliary dilatation. Pancreas: Unremarkable. No pancreatic ductal dilatation or surrounding inflammatory changes. Spleen: Normal in size without focal abnormality. Adrenals/Urinary Tract: Adrenal glands are unremarkable. Kidneys are normal, without renal calculi, focal lesion, or  hydronephrosis. Bladder is decompressed. Stomach/Bowel: Stomach is within normal limits. Appendix appears normal. No evidence of bowel wall thickening, distention, or inflammatory changes. Lymphatic: No enlarged abdominal or pelvic lymph nodes. Reproductive: Status post hysterectomy. No adnexal masses. Other: No abdominal wall hernia or abnormality. No abdominopelvic ascites. No pneumoperitoneum. Musculoskeletal: No acute or significant osseous findings. Review of the MIP images confirms the above findings. IMPRESSION: Vascular: 1. No evidence of acute aortic syndrome. 2. Unchanged ascending thoracic aortic aneurysm measuring 4.0 cm. Recommend annual imaging followup by CTA or MRA. This recommendation follows 2010 ACCF/AHA/AATS/ACR/ASA/SCA/SCAI/SIR/STS/SVM Guidelines for the Diagnosis and Management of Patients with Thoracic Aortic Disease. Circulation. 2010; [id number]. Aortic aneurysm NOS (ICD10-I71.9) 3.  Aortic atherosclerosis (ICD10-I70.0). Chest: 1. Mild interstitial pulmonary edema and trace left pleural effusion. 2. Unchanged moderate cardiomegaly. Abdomen and pelvis: 1.  No acute intra-abdominal process. Electronically Signed   By: Titus Dubin M.D.   On: 04/19/2019 16:49    Micro Results    Recent Results (from the past 240 hour(s))  SARS Coronavirus 2 [facility] order, Performed in [facility] hospital lab) Nasopharyngeal Nasopharyngeal Swab     Status: None   Collection Time: 05/10/19 10:07 AM   Specimen: Nasopharyngeal Swab  Result Value Ref Range Status   SARS Coronavirus 2 NEGATIVE NEGATIVE Final    Comment: (NOTE) If result is NEGATIVE SARS-CoV-2 target nucleic acids are NOT DETECTED. The SARS-CoV-2 RNA is generally detectable in upper and lower  respiratory specimens during the acute phase of infection. The lowest  concentration of SARS-CoV-2 viral copies this assay can detect is 250  copies / mL. A negative result does not preclude SARS-CoV-2 infection  and should not be  used as the sole basis for treatment or other  patient management decisions.  A negative result may occur with  improper specimen collection / handling, submission of specimen other  than nasopharyngeal swab, presence of viral mutation(s) within the  areas targeted by this assay, and inadequate number of viral copies  (<250 copies / mL). A negative result must be combined with clinical  observations, patient history, and epidemiological information. If result is POSITIVE SARS-CoV-2  target nucleic acids are DETECTED. The SARS-CoV-2 RNA is generally detectable in upper and lower  respiratory specimens dur ing the acute phase of infection.  Positive  results are indicative of active infection with SARS-CoV-2.  Clinical  correlation with patient history and other diagnostic information is  necessary to determine patient infection status.  Positive results do  not rule out bacterial infection or co-infection with other viruses. If result is PRESUMPTIVE POSTIVE SARS-CoV-2 nucleic acids MAY BE PRESENT.   A presumptive positive result was obtained on the submitted specimen  and confirmed on repeat testing.  While 2019 novel coronavirus  (SARS-CoV-2) nucleic acids may be present in the submitted sample  additional confirmatory testing may be necessary for epidemiological  and / or clinical management purposes  to differentiate between  SARS-CoV-2 and other Sarbecovirus currently known to infect humans.  If clinically indicated additional testing with an alternate test  methodology [phone]) is advised. The SARS-CoV-2 RNA is generally  detectable in upper and lower respiratory sp ecimens during the acute  phase of infection. The expected result is Negative. Fact Sheet for Patients:  StrictlyIdeas.no Fact Sheet for Healthcare Providers: BankingDealers.co.za This test is not yet approved or cleared by the [place] FDA and has been authorized  for detection and/or diagnosis of SARS-CoV-2 by FDA under an Emergency Use Authorization (EUA).  This EUA will remain in effect (meaning this test can be used) for the duration of the COVID-19 declaration under Section 564(b)(1) of the Act, 21 U.S.C. section 360bbb-3(b)(1), unless the authorization is terminated or revoked sooner. Performed at [facility], [address] [place], [place] [address]        Today   [place] today has no new complaints, -Voiding well, Ambulated and post ambulation O2 sats 97 to 98% on room air       Patient has been seen and examined prior to discharge   Objective   Blood pressure 121/82, pulse 77, temperature 98.2 F (36.8 C), temperature source Oral, resp. rate 18, height 5\' 6"  (1.676 m), weight 97.6 kg, last menstrual period 08/21/2011, SpO2 100 %.   Intake/Output Summary (Last 24 hours) at 05/14/2019 1218 Last data filed at 05/14/2019 1100 Gross per 24 hour  Intake 480 ml  Output 1700 ml  Net -1220 ml    Exam Gen:- Awake Alert, no acute distress, speaking in sentences HEENT:- [place].AT, No sclera icterus Neck-Supple Neck,No JVD,.  Lungs-much improved air movement, no wheezing CV- S1, S2 normal, irregularly irregular Abd-  +ve B.Sounds, Abd Soft, No tenderness,    Extremity/Skin:- No  edema,   good pulses Psych-affect is appropriate, oriented x3 Neuro-no new focal deficits, no tremors    Data Review   CBC w Diff:  Lab Results  Component Value Date   WBC 6.7 05/11/2019   HGB 12.6 05/11/2019   HCT 40.0 05/11/2019   PLT 238 05/11/2019   LYMPHOPCT 31 05/10/2019   BANDSPCT 0 05/10/2019   MONOPCT 7 05/10/2019   EOSPCT 1 05/10/2019   BASOPCT 0 05/10/2019    CMP:  Lab Results  Component Value Date   NA 133 (L) 05/14/2019   NA 134 (A) 08/24/2018   K 4.6 05/14/2019   CL 103 05/14/2019   CO2 21 (L) 05/14/2019   BUN 25 (H) 05/14/2019   BUN 15 08/24/2018   CREATININE 0.96 05/14/2019   CREATININE 0.81  07/11/2011   GLU 364 08/24/2018   PROT 6.9 05/10/2019   ALBUMIN 3.6 05/10/2019  BILITOT 0.8 05/10/2019   ALKPHOS 117 05/10/2019   AST 18 05/10/2019   ALT 18 05/10/2019  .   Total Discharge time is about 33 minutes  Roxan Hockey M.D on 05/14/2019 at 12:18 PM  Go to [email or web address] -  for contact info  [facility] - Office  [phone]

## 2019-05-14 NOTE — Progress Notes (Signed)
Patient ID: Robin [name], female   DOB: 28-Sep-1965, 53 y.o.   MRN: [id number]  Xcover HR 180 briefly per RN, pt asymptomatic, currently hr 90.  (afib).  Continue current medication.

## 2019-05-14 NOTE — Progress Notes (Addendum)
Patient on telemetry. Patient's heart rate changed to Aflutter increasing to 180s, and then decreasing back to Afib. Pt currently substaining in the 90s to 100s. Patient in room asymptomatic, no chest pain at this time. Blood pressure stable. Notified part-time MD. Will continue to monitor patient.

## 2019-05-14 NOTE — TOC Transition Note (Signed)
Transition of Care [facility]) - CM/SW Discharge Note   Patient Details  Name: Prabhjot Tuchscherer [place] MRN: [id number] Date of Birth: Jan 07, 1966  [facility] CM/SW Contact:  Trish Mage, LCSW Phone Number: 05/14/2019, 10:43 AM   Clinical Narrative:   Pt to d/c today.  Has been provided with [facility] voucher for meds, follow up appointment in place, information given on [facility] program.  TOC sign off.    Final next level of care: Home/Self Care     Patient Goals and CMS Choice        Discharge Placement                       Discharge Plan and Services   Discharge Planning Services: Medication Assistance, [place] [facility]                                 Social Determinants of Health (SDOH) Interventions     Readmission Risk Interventions No flowsheet data found.

## 2019-05-17 ENCOUNTER — Telehealth: Payer: Self-pay | Admitting: Cardiology

## 2019-05-17 NOTE — Telephone Encounter (Signed)
Pt was admitted last week and she's needing a work note, she was seen by B. Strader while in hospital

## 2019-05-18 ENCOUNTER — Inpatient Hospital Stay: Payer: Self-pay | Admitting: Physician Assistant

## 2019-05-18 NOTE — Telephone Encounter (Signed)
Please give pt a call concerning work note, she needs to know if she can return to work before 5 today and if so they need to put her on the schedule.    Scheduled post hosp f/u w/ B. Strader, pt is aware of date and time

## 2019-05-18 NOTE — Telephone Encounter (Signed)
    Can provide work note that she was admitted to [facility] from 10/5 - 05/14/2019. Please arrange a Hospital Follow-up Visit (can put in 7-day hold slot) as this was not arranged at the time of discharge.   Signed, [name], PA-C 05/18/2019, 9:15 AM Pager: [phone]

## 2019-05-18 NOTE — Telephone Encounter (Signed)
Letter provided

## 2019-05-24 ENCOUNTER — Telehealth: Payer: Self-pay | Admitting: Student

## 2019-05-24 NOTE — Progress Notes (Signed)
Virtual Visit via Telephone Note   This visit type was conducted due to national recommendations for restrictions regarding the COVID-19 Pandemic (e.g. social distancing) in an effort to limit this patient's exposure and mitigate transmission in our community.  Due to her co-morbid illnesses, this patient is at least at moderate risk for complications without adequate follow up.  This format is felt to be most appropriate for this patient at this time.  The patient did not have access to video technology/had technical difficulties with video requiring transitioning to audio format only (telephone).  All issues noted in this document were discussed and addressed.  No physical exam could be performed with this format.  Please refer to the patient's chart for her  consent to telehealth for [facility].   Date:  05/25/2019   ID:  Robin [name], DOB May 23, 1966, MRN [id number]  Patient Location: Home Provider Location: Office  PCP:  Inda Coke, [place]  Cardiologist:  Carlyle Dolly, MD  Electrophysiologist:  None   Evaluation Performed:  Follow-Up Visit  Chief Complaint: Hospital Follow-Up  History of Present Illness:    Robin [name] is a 53 y.o. female with past medical history of chronic systolic CHF (EF at 99991111 by echo in 08/2018),persistent atrial fibrillation (diagnosed in 04/2019), HTN, COPD,uncontrolled IDDM, thoracic aortic aneurysm (at 4.0 cm by imaging in 04/2019), history of normal cors by cath in 2010, and polysubstance abuse who presents for a hospital follow-up telehealth visit.   She was most recently admitted to [facility] on 05/10/2019 for evaluation of abdominal pain which was under her left breast. Was found to be in atrial fibrillation with RVR and to have an acute CHF exacerbation with BNP elevated to 903. Her atrial fibrillation had been diagnosed at the time of a prior ED visit but she self-discontinued Eliquis prior to admission. Follow-up echocardiogram  showed her EF remained reduced at 35%. She has been intolerant to ACE-I and ARB in the past, therefore Coreg was titrated to 37.5mg  BID and she was started on Hydralazine 10mg  BID and Imdur 30mg  daily (was not prescribed at discharge). Digoxin was added for improved rate-control with consideration of DCCV in 3 weeks if still in atrial fibrillation and compliant with anticoagulation. She was continued on Torsemide 20mg  twice daily at discharge with weight at 215 lbs.   In talking with the patient today, she reports overall much improvement in her symptoms since hospital discharge. Says that her breathing has been back to baseline and she has been able to return to work without difficulty. She also reports mopping a floor the other day which is first time she has been able to do this in several months. She denies any recent chest pain or palpitations. No orthopnea, PND or lower extremity edema. She has overall been tolerating her current medication well and denies any noted side effects.  She has been experiencing abdominal discomfort and reports an outpouching along her prior surgical scar from her C-section.  She has been referred to General Surgery by her PCP due to concerns for an abdominal hernia.  The patient does not have symptoms concerning for COVID-19 infection (fever, chills, cough, or new shortness of breath).    Past Medical History:  Diagnosis Date  . Allergy   . Anemia   . Arthritis   . CHF (congestive heart failure) ([place])    a. EF at 30-35% by echo in 08/2018 b. EF at 35% by repeat echo in 05/2019  . Chronic abdominal pain   .  COPD (chronic obstructive pulmonary disease) ([place])   . Drug use   . Essential hypertension, benign   . GERD (gastroesophageal reflux disease)   . Gout 2016  . Normal coronary arteries    3/10 - following abnormal Myoview  . Ovarian cyst   . Type 2 diabetes mellitus ([place])    Past Surgical History:  Procedure Laterality Date  . ABDOMINAL HYSTERECTOMY   09/10/2011   Procedure: HYSTERECTOMY ABDOMINAL;  Surgeon: Jonnie Kind, MD;  Location: [facility];  Service: Gynecology;  Laterality: N/A;  Abdominal hysterectomy  . CESAREAN SECTION  [id number], and 1994  . CHOLECYSTECTOMY  1995  . SCAR REVISION  09/10/2011   Procedure: SCAR REVISION;  Surgeon: Jonnie Kind, MD;  Location: [facility];  Service: Gynecology;  Laterality: N/A;  Wide Excision of old Cicatrix  . TUBAL LIGATION  1994     Current Meds  Medication Sig  . acetaminophen (TYLENOL) 325 MG tablet Take 2 tablets (650 mg total) by mouth every 6 (six) hours as needed for mild pain, fever or headache (or Fever >/= 101).  Marland Kitchen albuterol (PROVENTIL) (2.5 MG/3ML) 0.083% nebulizer solution Take 3 mLs (2.5 mg total) by nebulization every 6 (six) hours as needed for wheezing or shortness of breath.  Marland Kitchen albuterol (VENTOLIN HFA) 108 (90 Base) MCG/ACT inhaler Inhale 2 puffs into the lungs every 6 (six) hours as needed for wheezing or shortness of breath.  Marland Kitchen apixaban (ELIQUIS) 5 MG TABS tablet Take 1 tablet (5 mg total) by mouth 2 (two) times daily.  . carvedilol (COREG) 25 MG tablet Take 1.5 tablets (37.5 mg total) by mouth 2 (two) times daily with a meal.  . digoxin (LANOXIN) 0.125 MG tablet Take 1 tablet (0.125 mg total) by mouth daily.  Marland Kitchen gabapentin (NEURONTIN) 100 MG capsule Take 1 capsule (100 mg total) by mouth 3 (three) times daily.  . hydrALAZINE (APRESOLINE) 50 MG tablet Take 1 tablet (50 mg total) by mouth 3 (three) times daily.  . insulin aspart protamine- aspart (NOVOLOG MIX 70/30) (70-30) 100 UNIT/ML injection Take 18 units 2 times a day with meals  . metFORMIN (GLUCOPHAGE) 1000 MG tablet Take 1 tablet (1,000 mg total) by mouth 2 (two) times daily with a meal.  . pantoprazole (PROTONIX) 40 MG tablet Take 1 tablet (40 mg total) by mouth daily.  . potassium chloride SA (KLOR-CON) 20 MEQ tablet Take 1 tablet (20 mEq total) by mouth 2 (two) times daily.  Marland Kitchen torsemide (DEMADEX) 20 MG tablet Take 1 tablet  (20 mg total) by mouth 2 (two) times daily.     Allergies:   Bee venom, Losartan, Naproxen, Penicillins, and Lisinopril   Social History   Tobacco Use  . Smoking status: Former Smoker    Packs/day: 0.25    Years: 29.00    Pack years: 7.25    Types: Cigarettes  . Smokeless tobacco: Never Used  . Tobacco comment: 1 cigarette every 3 days  Substance Use Topics  . Alcohol use: No  . Drug use: Not Currently    Types: Cocaine, Marijuana     Family Hx: The patient's family history includes Alcohol abuse in her father, mother, and son; Cirrhosis in her mother; Diabetes type II in her father and sister; Early death in her brother and mother. There is no history of Anesthesia problems, Hypotension, Malignant hyperthermia, Pseudochol deficiency, Colon cancer, Colon polyps, Esophageal cancer, Rectal cancer, or Stomach cancer.  ROS:   Please see the history of present illness.  All other systems reviewed and are negative.   Prior CV studies:   The following studies were reviewed today:  Echocardiogram: 05/11/2019 IMPRESSIONS    1. Left ventricular ejection fraction, by visual estimation, is 35%. The left ventricle has moderate to severely decreased function. Normal left ventricular size. There is moderately increased left ventricular hypertrophy.  2. Elevated left ventricular end-diastolic pressure.  3. Left ventricular diastolic function could not be evaluated pattern of LV diastolic filling.  4. LV diffusely hypokinetic.  5. Global right ventricle has moderately reduced systolic function.The right ventricular size is normal. Mildly increased right ventricular wall thickness.  6. Left atrial size was mildly dilated.  7. Right atrial size was mildly dilated.  8. Moderate aortic valve annular calcification.  9. The mitral valve is grossly normal. Mild mitral valve regurgitation. 10. The tricuspid valve is grossly normal. Tricuspid valve regurgitation mild-moderate. 11. The aortic  valve is tricuspid Aortic valve regurgitation was not visualized by color flow Doppler. Mild aortic valve stenosis. 12. The pulmonic valve was not well visualized. Pulmonic valve regurgitation is not visualized by color flow Doppler. 13. Normal pulmonary artery systolic pressure. 14. The inferior vena cava is dilated in size with >50% respiratory variability, suggesting right atrial pressure of 8 mmHg.  Labs/Other Tests and Data Reviewed:    EKG:  An ECG dated 05/14/2019 was personally reviewed today and demonstrated:  atrial fibrillation, HR 94 with no acute ST or T-wave changes when compared to prior tracings.   Recent Labs: 05/10/2019: ALT 18; B Natriuretic Peptide 903.0; TSH 1.939 05/11/2019: Hemoglobin 12.6; Magnesium 2.1; Platelets 238 05/14/2019: BUN 25; Creatinine, Ser 0.96; Potassium 4.6; Sodium 133   Recent Lipid Panel Lab Results  Component Value Date/Time   CHOL 124 06/09/2011 06:46 AM   TRIG 185 (H) 06/09/2011 06:46 AM   HDL 47 06/09/2011 06:46 AM   CHOLHDL 2.6 06/09/2011 06:46 AM   LDLCALC 40 06/09/2011 06:46 AM    Wt Readings from Last 3 Encounters:  05/25/19 210 lb (95.3 kg)  05/25/19 210 lb (95.3 kg)  05/14/19 215 lb 2.7 oz (97.6 kg)     Objective:    Vital Signs:  BP (!) 132/98   Pulse 69   Ht 5\' 6"  (1.676 m)   Wt 210 lb (95.3 kg)   LMP 08/21/2011   BMI 33.89 kg/m    General: Pleasant female sounding in NAD Psych: Normal affect. Neuro: Alert and oriented X 3.  Lungs:  Resp regular and unlabored while talking on the phone.   ASSESSMENT & PLAN:    1. Chronic Combined Systolic and Diastolic CHF - She has a known reduced EF of 30 to 35% by echo in 08/2018 with similar results by repeat imaging earlier this month but she had self discontinued several of her cardiac medications during that timeframe. - She denies any recent orthopnea, PND or lower extremity edema since hospital discharge and says that her breathing has been at baseline. Weight has continued to  decline further since discharge as this is now at 210 lbs on her home scales. Continue Torsemide 20mg  BID. Will recheck a BMET to assess renal function and electrolytes.  - Has been intolerant to ACE-I and ARB in the past. Continue Coreg 37.5mg  BID, Digoxin 0.125mg  daily and Hydralazine 50mg  TID. While BP was at 132/98 when checked today, she reports that SBP has been in the low 100's at times and she does not have a cuff at home to check this regularly. Will reach out to  Social Work to see if a BP cuff can be mailed to the patient so she can follow this more consistently. Would consider the addition of Isordil or Spironolactone pending BP trend over time.   2. Persistent Atrial Fibrillation - The rhythm was initially diagnosed in 04/2019 but she self discontinued Eliquis prior to her recent hospitalization. She since reports good compliance with Eliquis 5 mg twice daily and denies missing any recent doses. Will arrange for her to have a nurse visit for an EKG within the next week. If still in atrial fibrillation, would plan to pursue DCCV given her cardiomyopathy. - Continue Coreg 37.5 mg twice daily for rate control along with Digoxin 0.125 mg daily. Continued compliance with anticoagulation encouraged.  3. HTN - BP has overall been well controlled when checked at home with SBP ranging from the low 100's to 130's.Continue current medication regimen at this time with plans to possibly add Isordil or Spironolactone in the future pending overall trend.   4. Thoracic Aortic Aneurysm - at 4.0 cm by imaging in 04/2019. Will need to follow with annual imaging.   5. Polysubstance Abuse  - UDS positive for opiates and cocaine last month. She denies any recurrent use.    COVID-19 Education: The signs and symptoms of COVID-19 were discussed with the patient and how to seek care for testing (follow up with PCP or arrange E-visit).  The importance of social distancing was discussed today.  Time:   Today, I  have spent 22 minutes with the patient with telehealth technology discussing the above problems.     Medication Adjustments/Labs and Tests Ordered: Current medicines are reviewed at length with the patient today.  Concerns regarding medicines are outlined above.   Tests Ordered: Orders Placed This Encounter  Procedures  . Basic Metabolic Panel (BMET)    Medication Changes: No orders of the defined types were placed in this encounter.   Follow Up: Nurse Visit for EKG within the next week; Follow-up IN [place] in 6 weeks.   Signed, [name], PA-C  05/25/2019 4:35 PM    [facility]

## 2019-05-24 NOTE — Telephone Encounter (Signed)
YOUR CARDIOLOGY TEAM HAS ARRANGED FOR AN E-VISIT FOR YOUR APPOINTMENT - PLEASE REVIEW IMPORTANT INFORMATION BELOW SEVERAL DAYS PRIOR TO YOUR APPOINTMENT  Due to the recent COVID-19 pandemic, we are transitioning in-[place] office visits to tele-medicine visits in an effort to decrease unnecessary exposure to our patients and staff. Medicare and most insurances are covering these visits without a copay needed. We also encourage you to sign up for MyChart if you have not already done so. You will need a smartphone if possible. For patients that do not have this, we can still complete the visit using a regular telephone but do prefer a smartphone to enable video when possible. You may have a close family member that lives with you that can help. If possible, we also ask that you have a blood pressure cuff and scale at home to measure your blood pressure, heart rate and weight prior to your scheduled appointment. Patients with clinical needs that need an in-[place] evaluation and testing will still be able to come to the office if absolutely necessary. If you have any questions, feel free to call our office.    IF YOU HAVE A SMARTPHONE, PLEASE DOWNLOAD THE WEBEX APP TO YOUR SMARTPHONE  - If Apple, go to App Store and type in WebEx in the search bar. Download [employer] Webex Meetings, the blue/green circle. The app is free but as with any other app download, your phone may require you to verify saved payment information or Apple password. You do NOT have to create a WebEx account.  - If Android, go to Google Play Store and type in WebEx in the search bar. Download [employer] Webex Meetings, the blue/green circle. The app is free but as with any other app download, your phone may require you to verify saved payment information or Android password. You do NOT have to create a WebEx account.  It is very helpful to have this downloaded before your visit.    2-3 DAYS BEFORE YOUR APPOINTMENT  You will receive a  telephone call from one of our HeartCare team members - your caller ID may say "Unknown caller." If this is a video visit, we will confirm that you have been able to download the WebEx app. We will remind you check your blood pressure, heart rate and weight prior to your scheduled appointment. If you have an Apple Watch or Kardia, please upload any pertinent ECG strips the day before or morning of your appointment to MyChart. Our staff will also make sure you have reviewed the consent and agree to move forward with your scheduled tele-health visit.     THE DAY OF YOUR APPOINTMENT  Approximately 15 minutes prior to your scheduled appointment, you will receive a telephone call from one of HeartCare team - your caller ID may say "Unknown caller."  Our staff will confirm medications, vital signs for the day and any symptoms you may be experiencing. Please have this information available prior to the time of visit start. It may also be helpful for you to have a pad of paper and pen handy for any instructions given during your visit. They will also walk you through joining the WebEx smartphone meeting if this is a video visit.    CONSENT FOR TELE-HEALTH VISIT - PLEASE REVIEW  I hereby voluntarily request, consent and authorize [facility] and its employed or contracted physicians, physician assistants, nurse practitioners or other licensed health care professionals (the Practitioner), to provide me with telemedicine health care services (the "Services") as   deemed necessary by the treating Practitioner. I acknowledge and consent to receive the Services by the Practitioner via telemedicine. I understand that the telemedicine visit will involve communicating with the Practitioner through live audiovisual communication technology and the disclosure of certain medical information by electronic transmission. I acknowledge that I have been given the opportunity to request an in-[place] assessment or other available  alternative prior to the telemedicine visit and am voluntarily participating in the telemedicine visit.  I understand that I have the right to withhold or withdraw my consent to the use of telemedicine in the course of my care at any time, without affecting my right to future care or treatment, and that the Practitioner or I may terminate the telemedicine visit at any time. I understand that I have the right to inspect all information obtained and/or recorded in the course of the telemedicine visit and may receive copies of available information for a reasonable fee.  I understand that some of the potential risks of receiving the Services via telemedicine include:  . Delay or interruption in medical evaluation due to technological equipment failure or disruption; . Information transmitted may not be sufficient (e.g. poor resolution of images) to allow for appropriate medical decision making by the Practitioner; and/or  . In rare instances, security protocols could fail, causing a breach of personal health information.  Furthermore, I acknowledge that it is my responsibility to provide information about my medical history, conditions and care that is complete and accurate to the best of my ability. I acknowledge that Practitioner's advice, recommendations, and/or decision may be based on factors not within their control, such as incomplete or inaccurate data provided by me or distortions of diagnostic images or specimens that may result from electronic transmissions. I understand that the practice of medicine is not an exact science and that Practitioner makes no warranties or guarantees regarding treatment outcomes. I acknowledge that I will receive a copy of this consent concurrently upon execution via email to the email address I last provided but may also request a printed copy by calling the office of [facility].    I understand that my insurance will be billed for this visit.   I have read or had  this consent read to me. . I understand the contents of this consent, which adequately explains the benefits and risks of the Services being provided via telemedicine.  . I have been provided ample opportunity to ask questions regarding this consent and the Services and have had my questions answered to my satisfaction. . I give my informed consent for the services to be provided through the use of telemedicine in my medical care  By participating in this telemedicine visit I agree to the above.  

## 2019-05-25 ENCOUNTER — Telehealth (INDEPENDENT_AMBULATORY_CARE_PROVIDER_SITE_OTHER): Payer: PRIVATE HEALTH INSURANCE | Admitting: Student

## 2019-05-25 ENCOUNTER — Encounter: Payer: Self-pay | Admitting: Physician Assistant

## 2019-05-25 ENCOUNTER — Ambulatory Visit (INDEPENDENT_AMBULATORY_CARE_PROVIDER_SITE_OTHER): Payer: PRIVATE HEALTH INSURANCE | Admitting: Physician Assistant

## 2019-05-25 ENCOUNTER — Encounter: Payer: Self-pay | Admitting: Student

## 2019-05-25 ENCOUNTER — Other Ambulatory Visit: Payer: Self-pay

## 2019-05-25 VITALS — BP 132/98 | HR 69 | Ht 66.0 in | Wt 210.0 lb

## 2019-05-25 VITALS — BP 132/78 | HR 69 | Ht 66.0 in | Wt 210.0 lb

## 2019-05-25 DIAGNOSIS — I712 Thoracic aortic aneurysm, without rupture, unspecified: Secondary | ICD-10-CM

## 2019-05-25 DIAGNOSIS — I5042 Chronic combined systolic (congestive) and diastolic (congestive) heart failure: Secondary | ICD-10-CM | POA: Diagnosis not present

## 2019-05-25 DIAGNOSIS — Z794 Long term (current) use of insulin: Secondary | ICD-10-CM

## 2019-05-25 DIAGNOSIS — L7682 Other postprocedural complications of skin and subcutaneous tissue: Secondary | ICD-10-CM | POA: Insufficient documentation

## 2019-05-25 DIAGNOSIS — I5023 Acute on chronic systolic (congestive) heart failure: Secondary | ICD-10-CM | POA: Diagnosis not present

## 2019-05-25 DIAGNOSIS — I4819 Other persistent atrial fibrillation: Secondary | ICD-10-CM

## 2019-05-25 DIAGNOSIS — E1143 Type 2 diabetes mellitus with diabetic autonomic (poly)neuropathy: Secondary | ICD-10-CM | POA: Diagnosis not present

## 2019-05-25 DIAGNOSIS — Z87898 Personal history of other specified conditions: Secondary | ICD-10-CM

## 2019-05-25 DIAGNOSIS — I4891 Unspecified atrial fibrillation: Secondary | ICD-10-CM

## 2019-05-25 DIAGNOSIS — F1410 Cocaine abuse, uncomplicated: Secondary | ICD-10-CM | POA: Diagnosis not present

## 2019-05-25 DIAGNOSIS — I10 Essential (primary) hypertension: Secondary | ICD-10-CM

## 2019-05-25 NOTE — Assessment & Plan Note (Signed)
Symptoms are currently well controlled.  She does appear to be compliant with her medications as of today.  She has a follow-up with cardiology later this afternoon for further discussion.

## 2019-05-25 NOTE — Assessment & Plan Note (Signed)
Currently asymptomatic.  She is tolerating her Eliquis without difficulty.  She does have an appointment this afternoon with cardiology for further evaluation and treatment.

## 2019-05-25 NOTE — Assessment & Plan Note (Signed)
Patient denies any further use.  We will continue to counsel.

## 2019-05-25 NOTE — Progress Notes (Signed)
[facility] Discharge Acute Issues Care Follow Up                                                                        Patient Demographics  Robin [name], is a 53 y.o. female  DOB [birth year]  MRN [id number].  Primary provider  Inda Coke, PA  I acted as a Education administrator for [employer], PA-C Anselmo Pickler, LPN  Past Medical History:  Diagnosis Date  . Allergy   . Anemia   . Arthritis   . CHF (congestive heart failure) ([place])    a. EF at 30-35% by echo in 08/2018  . Chronic abdominal pain   . COPD (chronic obstructive pulmonary disease) ([place])   . Drug use   . Essential hypertension, benign   . GERD (gastroesophageal reflux disease)   . Gout 2016  . Normal coronary arteries    3/10 - following abnormal Myoview  . Ovarian cyst   . Type 2 diabetes mellitus ([place])     Past Surgical History:  Procedure Laterality Date  . ABDOMINAL HYSTERECTOMY  09/10/2011   Procedure: HYSTERECTOMY ABDOMINAL;  Surgeon: Jonnie Kind, MD;  Location: [facility];  Service: Gynecology;  Laterality: N/A;  Abdominal hysterectomy  . CESAREAN SECTION  [id number], and 1994  . CHOLECYSTECTOMY  1995  . SCAR REVISION  09/10/2011   Procedure: SCAR REVISION;  Surgeon: Jonnie Kind, MD;  Location: [facility];  Service: Gynecology;  Laterality: N/A;  Wide Excision of old Cicatrix  . TUBAL LIGATION  1994   Recent HPI and Hospital Course Patient was admitted 10/5 through 0000000 for systolic congestive heart failure.  She presented to the ED with chest discomfort and shortness of breath.  She was found to be in A. fib with RVR and was started on a Cardizem drip.  Her BNP was 903 which was higher than her baseline, chest x-ray consistent with CHF.  She had negative troponins and normal creatinine and TSH.  During her prior ER visit in September, she tested positive for cocaine and opiates.  During this hospitalization however she  only tested positive for opiates.  While she was admitted she was diuresed and her symptoms improved.  She was switched to torsemide 20 mg twice daily at discharge as well as hydralazine 50 mg 3 times daily.  For her chronic A. fib and cocaine use, she was started on Coreg 37.5 mg twice daily and digoxin 0.125 mg for rate control.  She was recommended to be on Eliquis for stroke prevention.   Echo on 05/11/2019 shows --- Left Ventricle: Left ventricular ejection fraction, by visual estimation, is 35%. The left ventricle has moderate to severely decreased function. There is moderately increased left ventricular hypertrophy. Concentric left ventricular hypertrophy.   Subjective:   [name] today :   DM Blood sugars are ranging from 155 - 261.  She was switched from NovoLog 15 mg insulin 3 times daily with meals to NovoLog 70/30 18 units twice daily with  meals.  She was seeing endocrinology back in February, but she was lost to follow-up due to Covid.  She denies any concerns for hypoglycemia at this time.  She is wondering if she needs to have her regimen reevaluated.  Heart failure and A. Fib She is taking her medications as prescribed.  She is going to be seeing cardiology later this afternoon.  She denies any worsening symptoms including chest pain, shortness of breath, leg swelling.  She has worked really hard on getting her diet under better control.  she is weighing herself daily and has not had no significant weight gain.  Cocaine abuse She reports that she has not had any cocaine since the hospital.  She also continues to state that she does not know why she tested positive for cocaine in September as well as earlier this year.  Abdominal pain She has had 3 C-sections as well as a hysterectomy.  Patient reports that she is having some pain along her incision, it feels like a bulge. Started this weekend. She states that if she engages her abdominal muscles the pain is worse.  She is able to  have bowel movements without any difficulty.  She states that the pain is getting worse with time.  Denies any bloody stools, recent constipation, heavy lifting, SOB, lightheadedness, diaphoresis.  She states that there are no areas of swelling or drainage present.  No erythema.  Assessment & Plan   Type 2 diabetes mellitus with diabetic autonomic neuropathy, with long-term current use of insulin (HCC) Uncontrolled.  I provided the number for the endocrinologist that she has been seeing.  I recommended that she call them after our appointment to schedule an appointment and also ask for their advice given her changes in blood sugar control and recent updated insulin regimen after hospital.  Patient verbalized understanding to plan.  She denies any current symptoms of hyper or hypoglycemia, and she is educated on what to look for and how to respond to these episodes if they do arise.  Cocaine abuse [facility]) Patient denies any further use.  We will continue to counsel.  Acute on chronic systolic heart failure (HCC) Symptoms are currently well controlled.  She does appear to be compliant with her medications as of today.  She has a follow-up with cardiology later this afternoon for further discussion.  Atrial fibrillation with RVR (HCC) Currently asymptomatic.  She is tolerating her Eliquis without difficulty.  She does have an appointment this afternoon with cardiology for further evaluation and treatment.  Pain at surgical incision Difficult to assess virtually.  I did recommend that she proceed with surgical evaluation to determine if there is any sort of hernia forming.  She denies any severe abdominal pain or changes in ability to have bowel movements.  Discussed worsening precautions and what would warrant an ER evaluation including but not limited to: Abdominal pain, nausea, vomiting, inability to keep fluids down, any changes to cardiac symptoms as well.     Objective:   Vitals:   05/25/19  0823  BP: 132/78  Pulse: 69  Weight: 210 lb (95.3 kg)  Height: 5\' 6"  (1.676 m)    Wt Readings from Last 3 Encounters:  05/25/19 210 lb (95.3 kg)  05/14/19 215 lb 2.7 oz (97.6 kg)  04/19/19 219 lb (99.3 kg)    Allergies as of 05/25/2019      Reactions   Bee Venom Shortness Of Breath, Swelling   Bodily Swelling   Losartan Other (See Comments)  Nosebleeds per patient report.    Naproxen Other (See Comments)   Effects acid reflux   Penicillins Nausea Only   Has patient had a PCN reaction causing immediate rash, facial/tongue/throat swelling, SOB or lightheadedness with hypotension: no Has patient had a PCN reaction causing severe rash involving mucus membranes or skin necrosis: no Has patient had a PCN reaction that required hospitalization no Has patient had a PCN reaction occurring within the last 10 years: no If all of the above answers are "NO", then may proceed with Cephalosporin    Lisinopril    Sinus congestion      Medication List       Accurate as of May 25, 2019  9:45 AM. If you have any questions, ask your nurse or doctor.        acetaminophen 325 MG tablet Commonly known as: TYLENOL Take 2 tablets (650 mg total) by mouth every 6 (six) hours as needed for mild pain, fever or headache (or Fever >/= 101).   albuterol (2.5 MG/3ML) 0.083% nebulizer solution Commonly known as: PROVENTIL Take 3 mLs (2.5 mg total) by nebulization every 6 (six) hours as needed for wheezing or shortness of breath.   albuterol 108 (90 Base) MCG/ACT inhaler Commonly known as: VENTOLIN HFA Inhale 2 puffs into the lungs every 6 (six) hours as needed for wheezing or shortness of breath.   apixaban 5 MG Tabs tablet Commonly known as: ELIQUIS Take 1 tablet (5 mg total) by mouth 2 (two) times daily.   carvedilol 25 MG tablet Commonly known as: COREG Take 1.5 tablets (37.5 mg total) by mouth 2 (two) times daily with a meal.   digoxin 0.125 MG tablet Commonly known as: LANOXIN  Take 1 tablet (0.125 mg total) by mouth daily.   gabapentin 100 MG capsule Commonly known as: NEURONTIN Take 1 capsule (100 mg total) by mouth 3 (three) times daily.   hydrALAZINE 50 MG tablet Commonly known as: APRESOLINE Take 1 tablet (50 mg total) by mouth 3 (three) times daily.   insulin aspart protamine- aspart (70-30) 100 UNIT/ML injection Commonly known as: NovoLOG Mix 70/30 Take 18 units 2 times a day with meals   metFORMIN 1000 MG tablet Commonly known as: Glucophage Take 1 tablet (1,000 mg total) by mouth 2 (two) times daily with a meal.   pantoprazole 40 MG tablet Commonly known as: PROTONIX Take 1 tablet (40 mg total) by mouth daily.   potassium chloride SA 20 MEQ tablet Commonly known as: KLOR-CON Take 1 tablet (20 mEq total) by mouth 2 (two) times daily.   torsemide 20 MG tablet Commonly known as: DEMADEX Take 1 tablet (20 mg total) by mouth 2 (two) times daily.        Physical Exam: Constitutional: Patient appears well-developed and well-nourished. Not in obvious distress. HENT: Normocephalic, atraumatic, External right and left ear normal. Oropharynx is clear and moist.  Eyes: Conjunctivae and EOM are normal. PERRLA, no scleral icterus. Neck: Normal ROM. Neck supple. No JVD. No tracheal deviation. No thyromegaly. CVS: RRR, S1/S2 +, no murmurs, no gallops, no carotid bruit.  Pulmonary: Effort and breath sounds normal, no stridor, rhonchi, wheezes, rales.  Abdominal: Soft. BS +, no distension, tenderness, rebound or guarding.  Musculoskeletal: Normal range of motion. No edema and no tenderness.  Lymphadenopathy: No lymphadenopathy noted, cervical, inguinal or axillary Neuro: Alert. Normal reflexes, muscle tone coordination. No cranial nerve deficit. Skin: Skin is warm and dry. No rash noted. Not diaphoretic. No erythema. No pallor. Psychiatric: Normal  mood and affect. Behavior, judgment, thought content normal.   Data Review   Micro Results No  results found for this or any previous visit (from the past 240 hour(s)).   CBC No results for input(s): WBC, HGB, HCT, PLT, MCV, MCH, MCHC, RDW, LYMPHSABS, MONOABS, EOSABS, BASOSABS, BANDABS in the last 168 hours.  Invalid input(s): NEUTRABS, BANDSABD  Chemistries  No results for input(s): NA, K, CL, CO2, GLUCOSE, BUN, CREATININE, CALCIUM, MG, AST, ALT, ALKPHOS, BILITOT in the last 168 hours.  Invalid input(s): GFRCGP ------------------------------------------------------------------------------------------------------------------ estimated creatinine clearance is 79.8 mL/min (by C-G formula based on SCr of 0.96 mg/dL). ------------------------------------------------------------------------------------------------------------------ No results for input(s): HGBA1C in the last 72 hours. ------------------------------------------------------------------------------------------------------------------ No results for input(s): CHOL, HDL, LDLCALC, TRIG, CHOLHDL, LDLDIRECT in the last 72 hours. ------------------------------------------------------------------------------------------------------------------ No results for input(s): TSH, T4TOTAL, T3FREE, THYROIDAB in the last 72 hours.  Invalid input(s): FREET3 ------------------------------------------------------------------------------------------------------------------ No results for input(s): VITAMINB12, FOLATE, FERRITIN, TIBC, IRON, RETICCTPCT in the last 72 hours.  Coagulation profile No results for input(s): INR, PROTIME in the last 168 hours.  No results for input(s): DDIMER in the last 72 hours.  Cardiac Enzymes No results for input(s): CKMB, TROPONINI, MYOGLOBIN in the last 168 hours.  Invalid input(s): CK ------------------------------------------------------------------------------------------------------------------ Invalid input(s): POCBNP   I spent 45 minutes with this patient, greater than 50% was face-to-face time  counseling regarding the above diagnoses.    Inda Coke PA-C on 05/25/2019 at 9:45 AM   **Disclaimer: This note may have been dictated with voice recognition software. Similar sounding words can inadvertently be transcribed and this note may contain transcription errors which may not have been corrected upon publication of note.**

## 2019-05-25 NOTE — Assessment & Plan Note (Signed)
Uncontrolled.  I provided the number for the endocrinologist that she has been seeing.  I recommended that she call them after our appointment to schedule an appointment and also ask for their advice given her changes in blood sugar control and recent updated insulin regimen after hospital.  Patient verbalized understanding to plan.  She denies any current symptoms of hyper or hypoglycemia, and she is educated on what to look for and how to respond to these episodes if they do arise.

## 2019-05-25 NOTE — Assessment & Plan Note (Signed)
Difficult to assess virtually.  I did recommend that she proceed with surgical evaluation to determine if there is any sort of hernia forming.  She denies any severe abdominal pain or changes in ability to have bowel movements.  Discussed worsening precautions and what would warrant an ER evaluation including but not limited to: Abdominal pain, nausea, vomiting, inability to keep fluids down, any changes to cardiac symptoms as well.

## 2019-05-25 NOTE — Patient Instructions (Signed)
Medication Instructions:  Your physician recommends that you continue on your current medications as directed. Please refer to the Current Medication list given to you today.   Labwork: BMET (have done at nurse)   Testing/Procedures: NONE  Follow-Up: Your physician recommends that you schedule a follow-up appointment in: October 27 nurse visit @ 8:00 am   Your physician recommends that you schedule a follow-up appointment in: 6-8 WEEKS IN [place]    Any Other Special Instructions Will Be Listed Below (If Applicable).     If you need a refill on your cardiac medications before your next appointment, please call your pharmacy.

## 2019-05-26 ENCOUNTER — Telehealth: Payer: Self-pay | Admitting: Licensed Clinical Social Worker

## 2019-05-26 ENCOUNTER — Encounter (HOSPITAL_COMMUNITY): Payer: Self-pay

## 2019-05-26 ENCOUNTER — Telehealth: Payer: Self-pay | Admitting: Physician Assistant

## 2019-05-26 ENCOUNTER — Other Ambulatory Visit: Payer: Self-pay

## 2019-05-26 ENCOUNTER — Ambulatory Visit (HOSPITAL_COMMUNITY)
Admission: EM | Admit: 2019-05-26 | Discharge: 2019-05-26 | Disposition: A | Discharge status: Home / Self Care | Payer: PRIVATE HEALTH INSURANCE | Attending: Family Medicine | Admitting: Family Medicine

## 2019-05-26 DIAGNOSIS — L0291 Cutaneous abscess, unspecified: Secondary | ICD-10-CM

## 2019-05-26 MED ORDER — HYDROCODONE-ACETAMINOPHEN 5-325 MG PO TABS: 1.0000 | ORAL_TABLET | Freq: Four times a day (QID) | ORAL | 0 refills | Status: DC | PRN

## 2019-05-26 MED ORDER — DOXYCYCLINE HYCLATE 100 MG PO CAPS: 100.0000 mg | ORAL_CAPSULE | Freq: Two times a day (BID) | ORAL | 0 refills | Status: DC

## 2019-05-26 NOTE — Telephone Encounter (Signed)
CSW referred to assist patient with obtaining a BP cuff. CSW contacted patient to inform cuff will be delivered to home. Patient grateful for support and assistance. CSW available as needed. Jackie Da Michelle, LCSW, CCSW-MCS [phone]  

## 2019-05-26 NOTE — ED Triage Notes (Signed)
Pt presents to UC w/ c/o lower abdomen wound opening last night. Pt reports wound is from abdominal surgery 8 years ago. Pt states she noticed it bleeding this morning at 0100

## 2019-05-26 NOTE — Telephone Encounter (Signed)
Spoke to pt told her Robin [name] would like her to go to Urgent care to have opening evaluated at old incision. Pt verbalized understanding.
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
## 2019-05-26 NOTE — Discharge Instructions (Addendum)
We are treating you for infection.  Take the antibiotics as prescribed I will give you something for the pain. You need to watch the area closely over the next 24 to 48 hours.  If your symptoms worsen to include more severe pain, swelling, redness, fevers, dizziness, nausea or vomiting you need to go straight to the ER. Warm compresses to the area

## 2019-05-26 NOTE — Telephone Encounter (Signed)
Copied from [place] [id number]. Topic: General - Other >> May 26, 2019  8:17 AM Celene Kras A wrote: Reason for CRM: Pt called stating the knot that on her stomach that she spoke with PCP about yesterday. Pt states last night it began to bleed last night and now she has a hole. Please advise.

## 2019-05-28 ENCOUNTER — Telehealth: Payer: Self-pay | Admitting: Physician Assistant

## 2019-05-28 NOTE — Telephone Encounter (Signed)
See note  Copied from [place] [phone]. Topic: General - Other >> May 28, 2019 10:53 AM Antonieta Iba C wrote: Reason for CRM: [place] with [facility] called in to speak with referral coordinator about referral for pt. She is also sending a message to her via the system.

## 2019-06-01 ENCOUNTER — Ambulatory Visit: Payer: PRIVATE HEALTH INSURANCE

## 2019-06-03 ENCOUNTER — Ambulatory Visit: Payer: PRIVATE HEALTH INSURANCE

## 2019-06-07 ENCOUNTER — Other Ambulatory Visit: Payer: Self-pay

## 2019-06-09 ENCOUNTER — Ambulatory Visit (INDEPENDENT_AMBULATORY_CARE_PROVIDER_SITE_OTHER): Payer: PRIVATE HEALTH INSURANCE | Admitting: Internal Medicine

## 2019-06-09 ENCOUNTER — Encounter: Payer: Self-pay | Admitting: Internal Medicine

## 2019-06-09 ENCOUNTER — Other Ambulatory Visit: Payer: Self-pay

## 2019-06-09 VITALS — BP 130/70 | HR 96 | Ht 66.0 in | Wt 210.0 lb

## 2019-06-09 DIAGNOSIS — Z794 Long term (current) use of insulin: Secondary | ICD-10-CM

## 2019-06-09 DIAGNOSIS — E1165 Type 2 diabetes mellitus with hyperglycemia: Secondary | ICD-10-CM | POA: Diagnosis not present

## 2019-06-09 LAB — BASIC METABOLIC PANEL
BUN: 29 mg/dL — ABNORMAL HIGH (ref 6–23)
CO2: 30 mEq/L (ref 19–32)
Calcium: 9 mg/dL (ref 8.4–10.5)
Chloride: 102 mEq/L (ref 96–112)
Creatinine, Ser: 1.68 mg/dL — ABNORMAL HIGH (ref 0.40–1.20)
GFR: 38.58 mL/min — ABNORMAL LOW (ref >60.00)
Glucose, Bld: 128 mg/dL — ABNORMAL HIGH (ref 70–99)
Potassium: 4 mEq/L (ref 3.5–5.1)
Sodium: 140 mEq/L (ref 135–145)

## 2019-06-09 LAB — LIPID PANEL
Cholesterol: 112 mg/dL (ref 0–200)
HDL: 29.9 mg/dL — ABNORMAL LOW (ref >39.00)
LDL Cholesterol: 55 mg/dL (ref 0–99)
NonHDL: 81.65
Total CHOL/HDL Ratio: 4
Triglycerides: 131 mg/dL (ref 0.0–149.0)
VLDL: 26.2 mg/dL (ref 0.0–40.0)

## 2019-06-09 LAB — MICROALBUMIN / CREATININE URINE RATIO
Creatinine,U: 207.7 mg/dL
Microalb Creat Ratio: 1 mg/g (ref 0.0–30.0)
Microalb, Ur: 2 mg/dL — ABNORMAL HIGH (ref 0.0–1.9)

## 2019-06-09 MED ORDER — FARXIGA 5 MG PO TABS: 5.0000 mg | ORAL_TABLET | Freq: Every day | ORAL | 6 refills | Status: DC

## 2019-06-09 MED ORDER — METFORMIN HCL 1000 MG PO TABS: 1000.0000 mg | ORAL_TABLET | Freq: Every day | ORAL | 3 refills | Status: DC

## 2019-06-09 MED ORDER — CONTOUR NEXT TEST VI STRP: ORAL_STRIP | 12 refills | Status: DC

## 2019-06-09 NOTE — Patient Instructions (Signed)
-   Continue Metformin 1000 mg twice a day  - Continue Novolog mix 18 units with Breakfast and Supper - Start Farxiga 5 mg daily     Choose healthy, lower carb lower calorie snacks: toss salad, cooked vegetables, cottage cheese, peanut butter, low fat cheese / string cheese, lower sodium deli meat, tuna salad or chicken salad    HOW TO TREAT LOW BLOOD SUGARS (Blood sugar LESS THAN 70 MG/DL)  Please follow the RULE OF 15 for the treatment of hypoglycemia treatment (when your (blood sugars are less than 70 mg/dL)    STEP 1: Take 15 grams of carbohydrates when your blood sugar is low, which includes:   3-4 GLUCOSE TABS  OR  3-4 OZ OF JUICE OR REGULAR SODA OR  ONE TUBE OF GLUCOSE GEL     STEP 2: RECHECK blood sugar in 15 MINUTES STEP 3: If your blood sugar is still low at the 15 minute recheck --> then, go back to STEP 1 and treat AGAIN with another 15 grams of carbohydrates.

## 2019-06-09 NOTE — Progress Notes (Signed)
Name: Robin [name]  Age/ Sex: 53 y.o., female   MRN/ DOB: [id number], 1966-04-08     PCP: Inda Coke, PA   Reason for Endocrinology Evaluation: Type 2 Diabetes Mellitus  Initial Endocrine Consultative Visit: 10/01/2018    PATIENT IDENTIFIER: Robin [name] is a 53 y.o. female with a past medical history of T2DM, CHF, COPD and GERD. The patient has followed with Endocrinology clinic since 10/01/2018 for consultative assistance with management of her diabetes.  DIABETIC HISTORY:  Robin [name] was diagnosed with DM > 20 yrs ago. Has been on Janumet and Glipizide in the past. Has been on insulin for years. Her hemoglobin A1c has ranged from 7.9% in 2017, peaking at 11.3% in [id number]  Stopped Trulicity due to cost in 09/2018  SUBJECTIVE:   During the last visit (10/01/2018): A1c 11.3% . Continued metformin and novolog mix and added Trulicity   Today (99991111): Robin [name] is here for a follow up on diabetes management. She has not been here since 09/2018. She checks her blood sugars 2 times daily. The patient has had hypoglycemic episodes since the last clinic visit.  Otherwise, the patient has not required any recent emergency interventions for hypoglycemia but has had  hospitalizations secondary to CHF.     ROS: As per HPI and as detailed below: Review of Systems  Constitutional: Negative for chills and fever.  Respiratory: Positive for shortness of breath. Negative for cough.   Cardiovascular: Negative for chest pain and palpitations.  Gastrointestinal: Negative for diarrhea and nausea.      HOME DIABETES REGIMEN:  Metformin 1000 mg twice a day  Novolog mix 18 units BID     METER DOWNLOAD SUMMARY: Did not bring     DIABETIC COMPLICATIONS: Microvascular complications:   Neuropathy  Denies: CKD, retinopathy   Last eye exam: Completed many years ago    Macrovascular complications:   Denies: CAD, PVD, CVA    HISTORY:  Past Medical History:  Past Medical  History:  Diagnosis Date  . Allergy   . Anemia   . Arthritis   . CHF (congestive heart failure) ([place])    a. EF at 30-35% by echo in 08/2018 b. EF at 35% by repeat echo in 05/2019  . Chronic abdominal pain   . COPD (chronic obstructive pulmonary disease) ([place])   . Drug use   . Essential hypertension, benign   . GERD (gastroesophageal reflux disease)   . Gout 2016  . Normal coronary arteries    3/10 - following abnormal Myoview  . Ovarian cyst   . Type 2 diabetes mellitus ([place])    Past Surgical History:  Past Surgical History:  Procedure Laterality Date  . ABDOMINAL HYSTERECTOMY  09/10/2011   Procedure: HYSTERECTOMY ABDOMINAL;  Surgeon: Jonnie Kind, MD;  Location: [facility];  Service: Gynecology;  Laterality: N/A;  Abdominal hysterectomy  . CESAREAN SECTION  [id number], and 1994  . CHOLECYSTECTOMY  1995  . SCAR REVISION  09/10/2011   Procedure: SCAR REVISION;  Surgeon: Jonnie Kind, MD;  Location: [facility];  Service: Gynecology;  Laterality: N/A;  Wide Excision of old Cicatrix  . TUBAL LIGATION  1994    Social History:  reports that she has quit smoking. Her smoking use included cigarettes. She has a 7.25 pack-year smoking history. She has never used smokeless tobacco. She reports previous drug use. Drugs: Cocaine and Marijuana. She reports that she does not drink alcohol. Family History:  Family History  Problem Relation Age of  Onset  . Cirrhosis Mother   . Early death Mother   . Alcohol abuse Mother   . Diabetes type II Father   . Alcohol abuse Father   . Diabetes type II Sister   . Early death Brother   . Alcohol abuse Son   . Anesthesia problems Neg Hx   . Hypotension Neg Hx   . Malignant hyperthermia Neg Hx   . Pseudochol deficiency Neg Hx   . Colon cancer Neg Hx   . Colon polyps Neg Hx   . Esophageal cancer Neg Hx   . Rectal cancer Neg Hx   . Stomach cancer Neg Hx      HOME MEDICATIONS: Allergies as of 06/09/2019      Reactions   Bee Venom Shortness Of Breath,  Swelling   Bodily Swelling   Losartan Other (See Comments)   Nosebleeds per patient report.    Naproxen Other (See Comments)   Effects acid reflux   Penicillins Nausea Only   Has patient had a PCN reaction causing immediate rash, facial/tongue/throat swelling, SOB or lightheadedness with hypotension: no Has patient had a PCN reaction causing severe rash involving mucus membranes or skin necrosis: no Has patient had a PCN reaction that required hospitalization no Has patient had a PCN reaction occurring within the last 10 years: no If all of the above answers are "NO", then may proceed with Cephalosporin    Lisinopril    Sinus congestion      Medication List       Accurate as of June 09, 2019  1:46 PM. If you have any questions, ask your nurse or doctor.        STOP taking these medications   doxycycline 100 MG capsule Commonly known as: VIBRAMYCIN Stopped by: Dorita Sciara, MD   HYDROcodone-acetaminophen 5-325 MG tablet Commonly known as: NORCO/VICODIN Stopped by: Dorita Sciara, MD     TAKE these medications   acetaminophen 325 MG tablet Commonly known as: TYLENOL Take 2 tablets (650 mg total) by mouth every 6 (six) hours as needed for mild pain, fever or headache (or Fever >/= 101).   albuterol (2.5 MG/3ML) 0.083% nebulizer solution Commonly known as: PROVENTIL Take 3 mLs (2.5 mg total) by nebulization every 6 (six) hours as needed for wheezing or shortness of breath.   albuterol 108 (90 Base) MCG/ACT inhaler Commonly known as: VENTOLIN HFA Inhale 2 puffs into the lungs every 6 (six) hours as needed for wheezing or shortness of breath.   apixaban 5 MG Tabs tablet Commonly known as: ELIQUIS Take 1 tablet (5 mg total) by mouth 2 (two) times daily.   carvedilol 25 MG tablet Commonly known as: COREG Take 1.5 tablets (37.5 mg total) by mouth 2 (two) times daily with a meal.   Contour Next Test test strip Generic drug: glucose blood 2x daily  Started by: Dorita Sciara, MD   digoxin 0.125 MG tablet Commonly known as: LANOXIN Take 1 tablet (0.125 mg total) by mouth daily.   gabapentin 100 MG capsule Commonly known as: NEURONTIN Take 1 capsule (100 mg total) by mouth 3 (three) times daily.   hydrALAZINE 50 MG tablet Commonly known as: APRESOLINE Take 1 tablet (50 mg total) by mouth 3 (three) times daily.   insulin aspart protamine- aspart (70-30) 100 UNIT/ML injection Commonly known as: NovoLOG Mix 70/30 Take 18 units 2 times a day with meals   metFORMIN 1000 MG tablet Commonly known as: Glucophage Take 1 tablet (1,000 mg  total) by mouth 2 (two) times daily with a meal.   pantoprazole 40 MG tablet Commonly known as: PROTONIX Take 1 tablet (40 mg total) by mouth daily.   potassium chloride SA 20 MEQ tablet Commonly known as: KLOR-CON Take 1 tablet (20 mEq total) by mouth 2 (two) times daily.   torsemide 20 MG tablet Commonly known as: DEMADEX Take 1 tablet (20 mg total) by mouth 2 (two) times daily.        OBJECTIVE:   Vital Signs: BP 130/70 (BP Location: Left Arm, Patient Position: Sitting, Cuff Size: Normal)   Pulse 96   Ht 5\' 6"  (1.676 m)   Wt 210 lb (95.3 kg)   LMP 08/21/2011   SpO2 99%   BMI 33.89 kg/m   Wt Readings from Last 3 Encounters:  06/09/19 210 lb (95.3 kg)  05/25/19 210 lb (95.3 kg)  05/25/19 210 lb (95.3 kg)     Exam: General: Pt appears well and is in NAD  Lungs: Clear with good BS bilat with no rales, rhonchi, or wheezes  Heart: RRR with normal S1 and S2 and no gallops; no murmurs; no rub  Abdomen: Normoactive bowel sounds, soft, nontender  Extremities: No pretibial edema.   Neuro: MS is good with appropriate affect, pt is alert and Ox3    DM foot exam:06/09/2019  The skin of the feet is intact without sores or ulcerations. The pedal pulses are undetectable The sensation is intact to a screening 5.07, 10 gram monofilament bilaterally            DATA REVIEWED:   Lab Results  Component Value Date   HGBA1C 12.0 (H) 05/10/2019   HGBA1C 11.3 (H) 08/24/2018   HGBA1C 8.5 (H) 07/10/2016   Lab Results  Component Value Date   MICROALBUR 2.0 (H) 06/09/2019   LDLCALC 55 06/09/2019   CREATININE 1.68 (H) 06/09/2019   Lab Results  Component Value Date   MICRALBCREAT 1.0 06/09/2019     Lab Results  Component Value Date   CHOL 112 06/09/2019   HDL 29.90 (L) 06/09/2019   LDLCALC 55 06/09/2019   TRIG 131.0 06/09/2019   CHOLHDL 4 06/09/2019        Results for Leise, Kamron A (MRN [id number]) as of 06/09/2019 13:46  Ref. Range 06/09/2019 08:18  Sodium Latest Ref Range: 135 - 145 mEq/L 140  Potassium Latest Ref Range: 3.5 - 5.1 mEq/L 4.0  Chloride Latest Ref Range: 96 - 112 mEq/L 102  CO2 Latest Ref Range: 19 - 32 mEq/L 30  Glucose Latest Ref Range: 70 - 99 mg/dL 128 (H)  BUN Latest Ref Range: 6 - 23 mg/dL 29 (H)  Creatinine Latest Ref Range: 0.40 - 1.20 mg/dL 1.68 (H)  Calcium Latest Ref Range: 8.4 - 10.5 mg/dL 9.0  GFR Latest Ref Range: >60.00 mL/min 38.58 (L)   ASSESSMENT / PLAN / RECOMMENDATIONS:   1) Type 2 Diabetes Mellitus, Poorly controlled, With Neuropathic complications - Most recent A1c of 12.0 %. Goal A1c < 7.0 %.    - Poorly controlled diabetes due to medication non-adherence and dietary indiscretions.  - Pt states she is motivated now to improve glycemic control.  - We initially were going to add Farxiga to her regimen but due to AKI this was put on hold for now.  - Pt did not bring a meter today and I explained to her the importance of having her glucose readings available to me to make proper management decision, at this time I will  not change her insulin dose , as it seems her BG's has been < 150 mg/dL in the past week - We discussed the importance of dietary changes in glycemic control.    MEDICATIONS:  Decrease Metformin 1000 mg to once tablet daily ( due to AKI)  Continue Novolog Mix 18 units BID with Meals  EDUCATION /  INSTRUCTIONS:  BG monitoring instructions: Patient is instructed to check her blood sugars 2 times a day, fasting and supper.  Call [facility] if: BG persistently < 70 or > 300. . I reviewed the Rule of 15 for the treatment of hypoglycemia in detail with the patient. Literature supplied.   2) Diabetic complications:   Eye: Does not have known diabetic retinopathy. Pt urged to see an ophthalmologist  Neuro/ Feet: Does  have known diabetic peripheral neuropathy .   Renal: Patient does not have known baseline CKD.  3) AKI:   -  Unclear cause at this time, pt had a recent admission for CHF which could be one of the causes. She was advised to have a f/u with her PCP soon for repeat and to determine further management plan.  - IN the meantime she was advised not to start the [place] and will reduce Metformin dose by 50 % - Pt expressed understanding.       F/U in 3 months    Addendum: labs discussed with the pt over the phone at 1330 on 06/09/2019  Signed electronically by: Mack Guise, MD  [facility]  [address] [place], [place] [address] Phone: [phone] FAX: [phone]   CC: Inda Coke, [place] [address] Phone: [phone]  Fax: [phone]  Return to Endocrinology clinic as below: Future Appointments  Date Time Provider [place]  06/10/2019  4:15 PM [facility] NURSE [facility] [place] [facility]  07/13/2019  3:00 PM Ahmed Prima Fransisco Hertz, PA-C [facility] [place] [facility]  09/13/2019  7:30 AM Shamle[place], Melanie Crazier, MD [facility] None

## 2019-06-10 ENCOUNTER — Ambulatory Visit (INDEPENDENT_AMBULATORY_CARE_PROVIDER_SITE_OTHER): Payer: PRIVATE HEALTH INSURANCE | Admitting: Physician Assistant

## 2019-06-10 ENCOUNTER — Ambulatory Visit (INDEPENDENT_AMBULATORY_CARE_PROVIDER_SITE_OTHER): Payer: PRIVATE HEALTH INSURANCE | Admitting: *Deleted

## 2019-06-10 ENCOUNTER — Ambulatory Visit: Payer: PRIVATE HEALTH INSURANCE | Admitting: Physician Assistant

## 2019-06-10 ENCOUNTER — Encounter: Payer: Self-pay | Admitting: Physician Assistant

## 2019-06-10 ENCOUNTER — Other Ambulatory Visit (HOSPITAL_COMMUNITY)
Admission: RE | Admit: 2019-06-10 | Discharge: 2019-06-10 | Disposition: A | Discharge status: Home / Self Care | Payer: PRIVATE HEALTH INSURANCE | Source: Ambulatory Visit | Attending: Student | Admitting: Student

## 2019-06-10 VITALS — Ht 66.0 in | Wt 210.0 lb

## 2019-06-10 VITALS — Wt 210.0 lb

## 2019-06-10 DIAGNOSIS — N289 Disorder of kidney and ureter, unspecified: Secondary | ICD-10-CM

## 2019-06-10 DIAGNOSIS — I10 Essential (primary) hypertension: Secondary | ICD-10-CM | POA: Diagnosis not present

## 2019-06-10 DIAGNOSIS — I4819 Other persistent atrial fibrillation: Secondary | ICD-10-CM

## 2019-06-10 LAB — BASIC METABOLIC PANEL
Anion gap: 11 (ref 5–15)
BUN: 25 mg/dL — ABNORMAL HIGH (ref 6–20)
CO2: 25 mmol/L (ref 22–32)
Calcium: 8.6 mg/dL — ABNORMAL LOW (ref 8.9–10.3)
Chloride: 103 mmol/L (ref 98–111)
Creatinine, Ser: 1.36 mg/dL — ABNORMAL HIGH (ref 0.44–1.00)
GFR calc Af Amer: 52 mL/min — ABNORMAL LOW (ref >60)
GFR calc non Af Amer: 45 mL/min — ABNORMAL LOW (ref >60)
Glucose, Bld: 234 mg/dL — ABNORMAL HIGH (ref 70–99)
Potassium: 3.7 mmol/L (ref 3.5–5.1)
Sodium: 139 mmol/L (ref 135–145)
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
## 2019-06-11 NOTE — Telephone Encounter (Signed)
Called pt back to give recommendation from [facility]. Pt was advise  to go to urgent care to follow up with issues.

## 2019-06-11 NOTE — Telephone Encounter (Signed)
Copied from [place] [phone]. Topic: Appointment Scheduling - Scheduling Inquiry for Clinic >> Jun 11, 2019  8:02 AM Celene Kras A wrote: Reason for CRM: Pt called stating she has knots at her incision sight. Pt states she has taken all of the antibiotics, and that she woke up this morning and she was in terrible pain and the knot reappeared. Please advise.

## 2019-06-16 ENCOUNTER — Telehealth: Payer: Self-pay | Admitting: Student

## 2019-06-16 NOTE — Telephone Encounter (Signed)
Called asking if questions had been answered by [place] yet

## 2019-06-17 ENCOUNTER — Telehealth: Payer: Self-pay | Admitting: Student

## 2019-06-17 NOTE — Telephone Encounter (Signed)
Pt called and notified the [place] is still working on details for DCCV.

## 2019-06-17 NOTE — Telephone Encounter (Signed)
    I called and spoke to the patient in regards to possible DCCV as outlined during her recent admission and office note. Risks and benefits of the procedure were reviewed and she is in agreement for this. She reports having not missed any doses of Eliquis and continued compliance with this was reviewed.   She prefers for the DCCV to be performed on 11/24 or 06/30/2019 given her work schedule. Will route to [place] (has been filling in this week in [place]) to assist with scheduling. I informed the patient she will likely not hear back until tomorrow.   Signed, [name], PA-C 06/17/2019, 3:15 PM Pager: [phone]

## 2019-06-21 NOTE — Telephone Encounter (Signed)
Scheduled for Tues. 06/29/19 with Dr. Domenic Polite

## 2019-06-22 NOTE — Patient Instructions (Signed)
Robin [name]  06/22/2019     @PREFPERIOPPHARMACY @   Your procedure is scheduled on  06/29/2019 .  Report to Robin [name] at  0800  A.M.  Call this number if you have problems the morning of surgery:  [phone]   Remember:  Do not eat or drink after midnight.                        Take these medicines the morning of surgery with A SIP OF WATER digoxin, gabapentin, protonix. Use your nebulizer and your inhaler before you come. Take 1/2 of your usual night time insulin dosage the night before your procedure.    Do not wear jewelry, make-up or nail polish.  Do not wear lotions, powders, or perfumes. Please wear deodorant and brush your teeth.  Do not shave 48 hours prior to surgery.  Men may shave face and neck.  Do not bring valuables to the hospital.  [facility] is not responsible for any belongings or valuables.  Contacts, dentures or bridgework may not be worn into surgery.  Leave your suitcase in the car.  After surgery it may be brought to your room.  For patients admitted to the hospital, discharge time will be determined by your treatment team.  Patients discharged the day of surgery will not be allowed to drive home.   Name and phone number of your driver:   family Special instructions:  DO NOT miss any doses of your eliquis.   DO NOT take any medications for diabetes the morning of your procedure.  Please read over the following fact sheets that you were given. Anesthesia Post-op Instructions and Care and Recovery After Surgery       Electrical Cardioversion, Care After This sheet gives you information about how to care for yourself after your procedure. Your health care provider may also give you more specific instructions. If you have problems or questions, contact your health care provider. What can I expect after the procedure? After the procedure, it is common to have:  Some redness on the skin where the shocks were given. Follow these  instructions at home:   Do not drive for 24 hours if you were given a medicine to help you relax (sedative).  Take over-the-counter and prescription medicines only as told by your health care provider.  Ask your health care provider how to check your pulse. Check it often.  Rest for 48 hours after the procedure or as told by your health care provider.  Avoid or limit your caffeine use as told by your health care provider. Contact a health care provider if:  You feel like your heart is beating too quickly or your pulse is not regular.  You have a serious muscle cramp that does not go away. Get help right away if:   You have discomfort in your chest.  You are dizzy or you feel faint.  You have trouble breathing or you are short of breath.  Your speech is slurred.  You have trouble moving an arm or leg on one side of your body.  Your fingers or toes turn cold or blue. This information is not intended to replace advice given to you by your health care provider. Make sure you discuss any questions you have with your health care provider. Document Released: 05/12/2013 Document Revised: 07/04/2017 Document Reviewed: 01/26/2016 Elsevier Patient Education  2020 [place] After These  instructions provide you with information about caring for yourself after your procedure. Your health care provider may also give you more specific instructions. Your treatment has been planned according to current medical practices, but problems sometimes occur. Call your health care provider if you have any problems or questions after your procedure. What can I expect after the procedure? After your procedure, you may:  Feel sleepy for several hours.  Feel clumsy and have poor balance for several hours.  Feel forgetful about what happened after the procedure.  Have poor judgment for several hours.  Feel nauseous or vomit.  Have a sore throat if you had a  breathing tube during the procedure. Follow these instructions at home: For at least 24 hours after the procedure:      Have a responsible adult stay with you. It is important to have someone help care for you until you are awake and alert.  Rest as needed.  Do not: ? Participate in activities in which you could fall or become injured. ? Drive. ? Use heavy machinery. ? Drink alcohol. ? Take sleeping pills or medicines that cause drowsiness. ? Make important decisions or sign legal documents. ? Take care of children on your own. Eating and drinking  Follow the diet that is recommended by your health care provider.  If you vomit, drink water, juice, or soup when you can drink without vomiting.  Make sure you have little or no nausea before eating solid foods. General instructions  Take over-the-counter and prescription medicines only as told by your health care provider.  If you have sleep apnea, surgery and certain medicines can increase your risk for breathing problems. Follow instructions from your health care provider about wearing your sleep device: ? Anytime you are sleeping, including during daytime naps. ? While taking prescription pain medicines, sleeping medicines, or medicines that make you drowsy.  If you smoke, do not smoke without supervision.  Keep all follow-up visits as told by your health care provider. This is important. Contact a health care provider if:  You keep feeling nauseous or you keep vomiting.  You feel light-headed.  You develop a rash.  You have a fever. Get help right away if:  You have trouble breathing. Summary  For several hours after your procedure, you may feel sleepy and have poor judgment.  Have a responsible adult stay with you for at least 24 hours or until you are awake and alert. This information is not intended to replace advice given to you by your health care provider. Make sure you discuss any questions you have with  your health care provider. Document Released: 11/12/2015 Document Revised: 10/20/2017 Document Reviewed: 11/12/2015 Elsevier Patient Education  2020 [employer].

## 2019-06-25 ENCOUNTER — Telehealth: Payer: Self-pay | Admitting: Student

## 2019-06-25 NOTE — Telephone Encounter (Signed)
°  Precert needed for: Cardioverson   Location: [name]    Date: Jun 29, 2019  Sending today due to call from pre-service at 4:53 Jun 25, 2019 asking if this had been precerted.  Thank you

## 2019-06-28 ENCOUNTER — Encounter (HOSPITAL_COMMUNITY)
Admission: RE | Admit: 2019-06-28 | Discharge: 2019-06-28 | Disposition: A | Discharge status: Home / Self Care | Payer: PRIVATE HEALTH INSURANCE | Source: Ambulatory Visit | Attending: Cardiology | Admitting: Cardiology

## 2019-06-28 ENCOUNTER — Other Ambulatory Visit: Payer: Self-pay

## 2019-06-28 ENCOUNTER — Other Ambulatory Visit (HOSPITAL_COMMUNITY)
Admission: RE | Admit: 2019-06-28 | Discharge: 2019-06-28 | Disposition: A | Discharge status: Home / Self Care | Payer: PRIVATE HEALTH INSURANCE | Source: Ambulatory Visit | Attending: Cardiology | Admitting: Cardiology

## 2019-06-28 ENCOUNTER — Encounter (HOSPITAL_COMMUNITY): Payer: Self-pay

## 2019-06-28 DIAGNOSIS — Z01812 Encounter for preprocedural laboratory examination: Secondary | ICD-10-CM | POA: Insufficient documentation

## 2019-06-28 LAB — SARS CORONAVIRUS 2 (TAT 6-24 HRS): SARS Coronavirus 2: NEGATIVE

## 2019-06-28 LAB — BASIC METABOLIC PANEL
Anion gap: 15 (ref 5–15)
BUN: 20 mg/dL (ref 6–20)
CO2: 25 mmol/L (ref 22–32)
Calcium: 9 mg/dL (ref 8.9–10.3)
Chloride: 97 mmol/L — ABNORMAL LOW (ref 98–111)
Creatinine, Ser: 1.36 mg/dL — ABNORMAL HIGH (ref 0.44–1.00)
GFR calc Af Amer: 52 mL/min — ABNORMAL LOW (ref >60)
GFR calc non Af Amer: 45 mL/min — ABNORMAL LOW (ref >60)
Glucose, Bld: 302 mg/dL — ABNORMAL HIGH (ref 70–99)
Potassium: 3.9 mmol/L (ref 3.5–5.1)
Sodium: 137 mmol/L (ref 135–145)

## 2019-06-28 LAB — HEMOGLOBIN A1C
Hgb A1c MFr Bld: 11.4 % — ABNORMAL HIGH (ref 4.8–5.6)
Mean Plasma Glucose: 280.48 mg/dL

## 2019-06-28 LAB — GLUCOSE, CAPILLARY: Glucose-Capillary: 285 mg/dL — ABNORMAL HIGH (ref 70–99)

## 2019-06-29 ENCOUNTER — Encounter (HOSPITAL_COMMUNITY): Payer: Self-pay | Admitting: Anesthesiology

## 2019-06-29 ENCOUNTER — Ambulatory Visit: Payer: PRIVATE HEALTH INSURANCE | Admitting: Physician Assistant

## 2019-06-29 ENCOUNTER — Encounter (HOSPITAL_COMMUNITY)
Admission: RE | Disposition: A | Discharge status: Home / Self Care | Payer: Self-pay | Source: Home / Self Care | Attending: Cardiology

## 2019-06-29 ENCOUNTER — Ambulatory Visit (HOSPITAL_COMMUNITY)
Admission: RE | Admit: 2019-06-29 | Discharge: 2019-06-29 | Disposition: A | Discharge status: Home / Self Care | Payer: PRIVATE HEALTH INSURANCE | Attending: Cardiology | Admitting: Cardiology

## 2019-06-29 ENCOUNTER — Ambulatory Visit (INDEPENDENT_AMBULATORY_CARE_PROVIDER_SITE_OTHER)
Admission: EM | Admit: 2019-06-29 | Discharge: 2019-06-29 | Disposition: A | Discharge status: Home / Self Care | Payer: PRIVATE HEALTH INSURANCE | Source: Home / Self Care

## 2019-06-29 ENCOUNTER — Other Ambulatory Visit: Payer: Self-pay

## 2019-06-29 DIAGNOSIS — I4891 Unspecified atrial fibrillation: Secondary | ICD-10-CM | POA: Diagnosis not present

## 2019-06-29 DIAGNOSIS — Z79899 Other long term (current) drug therapy: Secondary | ICD-10-CM | POA: Insufficient documentation

## 2019-06-29 DIAGNOSIS — Z888 Allergy status to other drugs, medicaments and biological substances status: Secondary | ICD-10-CM | POA: Diagnosis not present

## 2019-06-29 DIAGNOSIS — I4819 Other persistent atrial fibrillation: Secondary | ICD-10-CM

## 2019-06-29 DIAGNOSIS — K219 Gastro-esophageal reflux disease without esophagitis: Secondary | ICD-10-CM | POA: Insufficient documentation

## 2019-06-29 DIAGNOSIS — Z794 Long term (current) use of insulin: Secondary | ICD-10-CM | POA: Diagnosis not present

## 2019-06-29 DIAGNOSIS — Z7901 Long term (current) use of anticoagulants: Secondary | ICD-10-CM | POA: Diagnosis not present

## 2019-06-29 DIAGNOSIS — I5022 Chronic systolic (congestive) heart failure: Secondary | ICD-10-CM | POA: Diagnosis not present

## 2019-06-29 DIAGNOSIS — J449 Chronic obstructive pulmonary disease, unspecified: Secondary | ICD-10-CM | POA: Diagnosis not present

## 2019-06-29 DIAGNOSIS — F1410 Cocaine abuse, uncomplicated: Secondary | ICD-10-CM | POA: Insufficient documentation

## 2019-06-29 DIAGNOSIS — E1143 Type 2 diabetes mellitus with diabetic autonomic (poly)neuropathy: Secondary | ICD-10-CM | POA: Diagnosis not present

## 2019-06-29 DIAGNOSIS — I110 Hypertensive heart disease with heart failure: Secondary | ICD-10-CM | POA: Insufficient documentation

## 2019-06-29 DIAGNOSIS — Z833 Family history of diabetes mellitus: Secondary | ICD-10-CM | POA: Diagnosis not present

## 2019-06-29 DIAGNOSIS — Z880 Allergy status to penicillin: Secondary | ICD-10-CM | POA: Insufficient documentation

## 2019-06-29 DIAGNOSIS — Z886 Allergy status to analgesic agent status: Secondary | ICD-10-CM | POA: Diagnosis not present

## 2019-06-29 DIAGNOSIS — Z87891 Personal history of nicotine dependence: Secondary | ICD-10-CM | POA: Diagnosis not present

## 2019-06-29 DIAGNOSIS — O8600 Infection of obstetric surgical wound, unspecified: Secondary | ICD-10-CM | POA: Diagnosis not present

## 2019-06-29 DIAGNOSIS — I712 Thoracic aortic aneurysm, without rupture: Secondary | ICD-10-CM | POA: Diagnosis not present

## 2019-06-29 DIAGNOSIS — M1990 Unspecified osteoarthritis, unspecified site: Secondary | ICD-10-CM | POA: Diagnosis not present

## 2019-06-29 DIAGNOSIS — E1165 Type 2 diabetes mellitus with hyperglycemia: Secondary | ICD-10-CM | POA: Diagnosis not present

## 2019-06-29 DIAGNOSIS — T8141XA Infection following a procedure, superficial incisional surgical site, initial encounter: Secondary | ICD-10-CM | POA: Diagnosis not present

## 2019-06-29 DIAGNOSIS — Z538 Procedure and treatment not carried out for other reasons: Secondary | ICD-10-CM | POA: Diagnosis not present

## 2019-06-29 LAB — RAPID URINE DRUG SCREEN, HOSP PERFORMED
Amphetamines: NOT DETECTED
Barbiturates: NOT DETECTED
Benzodiazepines: NOT DETECTED
Cocaine: POSITIVE — AB
Opiates: NOT DETECTED
Tetrahydrocannabinol: NOT DETECTED

## 2019-06-29 SURGERY — CARDIOVERSION
Anesthesia: Monitor Anesthesia Care

## 2019-06-29 MED ORDER — ARTIFICIAL TEARS OPHTHALMIC OINT
TOPICAL_OINTMENT | OPHTHALMIC | Status: AC
  Filled 2019-06-29: qty 3.5

## 2019-06-29 MED ORDER — PROPOFOL 10 MG/ML IV BOLUS
INTRAVENOUS | Status: AC
  Filled 2019-06-29: qty 40

## 2019-06-29 MED ORDER — SULFAMETHOXAZOLE-TRIMETHOPRIM 800-160 MG PO TABS: 1.0000 | ORAL_TABLET | Freq: Two times a day (BID) | ORAL | 0 refills | Status: AC

## 2019-06-29 MED ORDER — HYDROCODONE-ACETAMINOPHEN 5-325 MG PO TABS: 1.0000 | ORAL_TABLET | ORAL | 0 refills | Status: DC | PRN

## 2019-06-29 NOTE — ED Triage Notes (Signed)
Pt states she has pain in her abdominal area. Pt has been taken antibiotic for this stomach pain. Pt states she has a open wound in her abdominal area. Pt is not sure this wound has been there. The area is drianing as well.

## 2019-06-29 NOTE — Progress Notes (Signed)
Urine drug screen positive for cocaine today. Dr. Domenic Polite notified via Epic in basket. Case cancelled. MD stated that the likelihood of the pt converting and staying in a sinus rhythm is slim due to use of cocaine and uncontrolled diabetes. (A1C of 11.2) Pt instructed to continue home medication regimen and keep scheduled office visit on December 8.

## 2019-06-29 NOTE — Telephone Encounter (Signed)
For [employer]

## 2019-06-29 NOTE — Telephone Encounter (Signed)
Pt called back, told her Aldona Bar would like her to contact her Endocrinologist regarding elevated sugars. Pt verbalized understanding and will call them.

## 2019-06-29 NOTE — Progress Notes (Signed)
Patient was scheduled for elective cardioversion of atrial fibrillation with me this morning.  UDS checked by staff preprocedure and found to be positive for cocaine (as it was 2 months ago).  I also note that she remains hyperglycemic with glucose in the 280s.  Procedure has been canceled and office follow-up already arranged.

## 2019-06-29 NOTE — Discharge Instructions (Addendum)
-   Take medications as prescribed.  - After 24-48 hours, the redness around the wound should stop spreading and the wound should also be less painful. - Watch for: More redness, swelling, or pain. More fluid or blood. Warmth. Pus or a bad smell.

## 2019-06-29 NOTE — Telephone Encounter (Signed)
Copied from [place] [id number]. Topic: General - Other >> Jun 29, 2019  9:26 AM Leward Quan A wrote: Reason for CRM: Patient called say that she had a missed call and can be reached at Ph# [phone]

## 2019-06-29 NOTE — Telephone Encounter (Signed)
Tried to contact pt no answer, voicemail not set up. If pt calls back please put her thru to me.
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
## 2019-07-06 ENCOUNTER — Other Ambulatory Visit: Payer: PRIVATE HEALTH INSURANCE

## 2019-07-09 ENCOUNTER — Telehealth: Payer: Self-pay | Admitting: Physician Assistant

## 2019-07-09 NOTE — Telephone Encounter (Signed)
See note

## 2019-07-09 NOTE — Telephone Encounter (Signed)
See below

## 2019-07-09 NOTE — Telephone Encounter (Signed)
Pt scheduled for DOXY on 07/12/2019

## 2019-07-09 NOTE — Telephone Encounter (Signed)
Patient is calling to request an referral to the dr that did her Hysterotomy surgery- Dr. Glo Herring in [place].  Patient is continuing to have draining and her enscissions are opening. Patient has been to the Urgent Care twice for antibiotics. When the patient the enscissions close up and the drainage stops. Patient has stopped the antibiotics last night and is requesting a refill. And the drainage and pain has resumed. Antibotic-Bactrim  Please advise CB- [phone]

## 2019-07-09 NOTE — Telephone Encounter (Signed)
Please schedule appointment.

## 2019-07-12 ENCOUNTER — Encounter: Payer: Self-pay | Admitting: Physician Assistant

## 2019-07-12 ENCOUNTER — Ambulatory Visit (INDEPENDENT_AMBULATORY_CARE_PROVIDER_SITE_OTHER): Payer: PRIVATE HEALTH INSURANCE | Admitting: Physician Assistant

## 2019-07-12 DIAGNOSIS — T8141XA Infection following a procedure, superficial incisional surgical site, initial encounter: Secondary | ICD-10-CM

## 2019-07-12 MED ORDER — DOXYCYCLINE HYCLATE 100 MG PO TABS: 100.0000 mg | ORAL_TABLET | Freq: Two times a day (BID) | ORAL | 0 refills | Status: DC
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
## 2019-07-13 ENCOUNTER — Encounter: Payer: Self-pay | Admitting: Student

## 2019-07-13 ENCOUNTER — Ambulatory Visit: Payer: PRIVATE HEALTH INSURANCE | Admitting: Student

## 2019-07-13 NOTE — Progress Notes (Deleted)
Cardiology Office Note    Date:  07/13/2019   ID:  Robin [name], DOB Jul 10, 1966, MRN [id number]  PCP:  Inda Coke, PA  Cardiologist: [place] Dolly, MD    No chief complaint on file.   History of Present Illness:    Robin [name] is a 53 y.o. female with past medical history of chronic systolic CHF (EF at 30 to 35% by echo in 08/2018 with similar results by echo in 05/2019), persistent atrial fibrillation (diagnosed in 04/2019), HTN, IDDM, COPD, thoracic aortic aneurysm, normal coronaries by catheterization in 2010, and history of substance abuse who presents to the office today for 6-week follow-up.  She most recently had a telehealth visit with myself in 05/2019 following a recent hospitalization for a CHF exacerbation.  She was found to be in atrial fibrillation with RVR during admission and was started on anticoagulation with plans for a DCCV in 3 weeks if still in atrial fibrillation.  She was overall doing well at the time of her visit and reported breathing has been back to baseline.  She denied any chest pain or palpitations.  She had been intolerant to ACE-I and ARB in the past, therefore she was continued on Coreg, digoxin and hydralazine along with Torsemide 20 mg twice daily.  Options were reviewed with the patient and a DCCV was recommended.  She presented [facility] for the procedure and a UDS was checked in the preoperative setting and was positive for cocaine. Given this along with her significant hyperglycemia, the procedure was canceled given her low likelihood of maintaining normal rhythm.    Past Medical History:  Diagnosis Date  . Allergy   . Anemia   . Arthritis   . CHF (congestive heart failure) ([place])    a. EF at 30-35% by echo in 08/2018 b. EF at 35% by repeat echo in 05/2019  . Chronic abdominal pain   . COPD (chronic obstructive pulmonary disease) ([place])   . Drug use   . Essential hypertension, benign   . GERD (gastroesophageal  reflux disease)   . Gout 2016  . Normal coronary arteries    3/10 - following abnormal Myoview  . Ovarian cyst   . Type 2 diabetes mellitus ([place])     Past Surgical History:  Procedure Laterality Date  . ABDOMINAL HYSTERECTOMY  09/10/2011   Procedure: HYSTERECTOMY ABDOMINAL;  Surgeon: Jonnie Kind, MD;  Location: [facility];  Service: Gynecology;  Laterality: N/A;  Abdominal hysterectomy  . CESAREAN SECTION  [id number], and 1994  . CHOLECYSTECTOMY  1995  . SCAR REVISION  09/10/2011   Procedure: SCAR REVISION;  Surgeon: Jonnie Kind, MD;  Location: [facility];  Service: Gynecology;  Laterality: N/A;  Wide Excision of old Cicatrix  . TUBAL LIGATION  1994    Current Medications: Outpatient Medications Prior to Visit  Medication Sig Dispense Refill  . acetaminophen (TYLENOL) 325 MG tablet Take 2 tablets (650 mg total) by mouth every 6 (six) hours as needed for mild pain, fever or headache (or Fever >/= 101). 12 tablet 0  . acetaminophen (TYLENOL) 500 MG tablet Take 1,000 mg by mouth every 6 (six) hours as needed (for pain.).    Marland Kitchen albuterol (PROVENTIL) (2.5 MG/3ML) 0.083% nebulizer solution Take 3 mLs (2.5 mg total) by nebulization every 6 (six) hours as needed for wheezing or shortness of breath. 75 mL 5  . albuterol (VENTOLIN HFA) 108 (90 Base) MCG/ACT inhaler Inhale 2 puffs into the lungs every  6 (six) hours as needed for wheezing or shortness of breath. 18 g 5  . apixaban (ELIQUIS) 5 MG TABS tablet Take 1 tablet (5 mg total) by mouth 2 (two) times daily. 60 tablet 5  . carvedilol (COREG) 25 MG tablet Take 1.5 tablets (37.5 mg total) by mouth 2 (two) times daily with a meal. 90 tablet 5  . digoxin (LANOXIN) 0.125 MG tablet Take 1 tablet (0.125 mg total) by mouth daily. 30 tablet 5  . doxycycline (VIBRA-TABS) 100 MG tablet Take 1 tablet (100 mg total) by mouth 2 (two) times daily. 20 tablet 0  . gabapentin (NEURONTIN) 100 MG capsule Take 1 capsule (100 mg total) by mouth 3 (three) times daily. 90  capsule 5  . glucose blood (CONTOUR NEXT TEST) test strip 2x daily 100 each 12  . hydrALAZINE (APRESOLINE) 50 MG tablet Take 1 tablet (50 mg total) by mouth 3 (three) times daily. 90 tablet 5  . HYDROcodone-acetaminophen (NORCO/VICODIN) 5-325 MG tablet Take 1 tablet by mouth every 4 (four) hours as needed for severe pain. 10 tablet 0  . insulin aspart protamine- aspart (NOVOLOG MIX 70/30) (70-30) 100 UNIT/ML injection Take 18 units 2 times a day with meals (Patient taking differently: Inject 18 Units into the skin 2 (two) times daily. Take 18 units 2 times a day with meals) 10 mL 11  . metFORMIN (GLUCOPHAGE) 1000 MG tablet Take 1 tablet (1,000 mg total) by mouth daily with breakfast. 90 tablet 3  . pantoprazole (PROTONIX) 40 MG tablet Take 1 tablet (40 mg total) by mouth daily. 30 tablet 5  . potassium chloride SA (KLOR-CON) 20 MEQ tablet Take 1 tablet (20 mEq total) by mouth 2 (two) times daily. 60 tablet 3  . torsemide (DEMADEX) 20 MG tablet Take 1 tablet (20 mg total) by mouth 2 (two) times daily. 60 tablet 5   No facility-administered medications prior to visit.      Allergies:   Bee venom, Losartan, Naproxen, Penicillins, and Lisinopril   Social History   Socioeconomic History  . Marital status: Single    Spouse name: Not on file  . Number of children: Not on file  . Years of education: Not on file  . Highest education level: Not on file  Occupational History  . Not on file  Social Needs  . Financial resource strain: Not on file  . Food insecurity    Worry: Not on file    Inability: Not on file  . Transportation needs    Medical: Not on file    Non-medical: Not on file  Tobacco Use  . Smoking status: Former Smoker    Packs/day: 0.25    Years: 29.00    Pack years: 7.25    Types: Cigarettes  . Smokeless tobacco: Never Used  . Tobacco comment: 1 cigarette every 3 days  Substance and Sexual Activity  . Alcohol use: Not Currently  . Drug use: Not Currently    Types:  Cocaine, Marijuana  . Sexual activity: Yes    Birth control/protection: Surgical  Lifestyle  . Physical activity    Days per week: Not on file    Minutes per session: Not on file  . Stress: Not on file  Relationships  . Social Herbalist on phone: Not on file    Gets together: Not on file    Attends religious service: Not on file    Active member of club or organization: Not on file    Attends  meetings of clubs or organizations: Not on file    Relationship status: Not on file  Other Topics Concern  . Not on file  Social History Narrative   Part-time at [employer], lives in [place], [place]   3 children    Married     Family History:  The patient's ***family history includes Alcohol abuse in her father, mother, and son; Cirrhosis in her mother; Diabetes type II in her father and sister; Early death in her brother and mother.   Review of Systems:   Please see the history of present illness.     General:  No chills, fever, night sweats or weight changes.  Cardiovascular:  No chest pain, dyspnea on exertion, edema, orthopnea, palpitations, paroxysmal nocturnal dyspnea. Dermatological: No rash, lesions/masses Respiratory: No cough, dyspnea Urologic: No hematuria, dysuria Abdominal:   No nausea, vomiting, diarrhea, bright red blood per rectum, melena, or hematemesis Neurologic:  No visual changes, wkns, changes in mental status. All other systems reviewed and are otherwise negative except as noted above.   Physical Exam:    VS:  LMP 08/21/2011    General: Well developed, well nourished,female appearing in no acute distress. Head: Normocephalic, atraumatic, sclera non-icteric, no xanthomas, nares are without discharge.  Neck: No carotid bruits. JVD not elevated.  Lungs: Respirations regular and unlabored, without wheezes or rales.  Heart: ***Regular rate and rhythm. No S3 or S4.  No murmur, no rubs, or gallops appreciated. Abdomen: Soft, non-tender, non-distended  with normoactive bowel sounds. No hepatomegaly. No rebound/guarding. No obvious abdominal masses. Msk:  Strength and tone appear normal for age. No joint deformities or effusions. Extremities: No clubbing or cyanosis. No edema.  Distal pedal pulses are 2+ bilaterally. Neuro: Alert and oriented X 3. Moves all extremities spontaneously. No focal deficits noted. Psych:  Responds to questions appropriately with a normal affect. Skin: No rashes or lesions noted  Wt Readings from Last 3 Encounters:  06/29/19 210 lb (95.3 kg)  06/28/19 210 lb (95.3 kg)  06/10/19 210 lb (95.3 kg)        Studies/Labs Reviewed:   EKG:  EKG is*** ordered today.  The ekg ordered today demonstrates ***  Recent Labs: 05/10/2019: ALT 18; B Natriuretic Peptide 903.0; TSH 1.939 05/11/2019: Hemoglobin 12.6; Magnesium 2.1; Platelets 238 06/28/2019: BUN 20; Creatinine, Ser 1.36; Potassium 3.9; Sodium 137   Lipid Panel    Component Value Date/Time   CHOL 112 06/09/2019 0818   TRIG 131.0 06/09/2019 0818   HDL 29.90 (L) 06/09/2019 0818   CHOLHDL 4 06/09/2019 0818   VLDL 26.2 06/09/2019 0818   LDLCALC 55 06/09/2019 0818    Additional studies/ records that were reviewed today include:   Echocardiogram: 05/11/2019 IMPRESSIONS    1. Left ventricular ejection fraction, by visual estimation, is 35%. The left ventricle has moderate to severely decreased function. Normal left ventricular size. There is moderately increased left ventricular hypertrophy.  2. Elevated left ventricular end-diastolic pressure.  3. Left ventricular diastolic function could not be evaluated pattern of LV diastolic filling.  4. LV diffusely hypokinetic.  5. Global right ventricle has moderately reduced systolic function.The right ventricular size is normal. Mildly increased right ventricular wall thickness.  6. Left atrial size was mildly dilated.  7. Right atrial size was mildly dilated.  8. Moderate aortic valve annular calcification.   9. The mitral valve is grossly normal. Mild mitral valve regurgitation. 10. The tricuspid valve is grossly normal. Tricuspid valve regurgitation mild-moderate. 11. The aortic valve is tricuspid Aortic  valve regurgitation was not visualized by color flow Doppler. Mild aortic valve stenosis. 12. The pulmonic valve was not well visualized. Pulmonic valve regurgitation is not visualized by color flow Doppler. 13. Normal pulmonary artery systolic pressure. 14. The inferior vena cava is dilated in size with >50% respiratory variability, suggesting right atrial pressure of 8 mmHg.  Assessment:    No diagnosis found.   Plan:   In order of problems listed above:  1. ***    Medication Adjustments/Labs and Tests Ordered: Current medicines are reviewed at length with the patient today.  Concerns regarding medicines are outlined above.  Medication changes, Labs and Tests ordered today are listed in the Patient Instructions below. There are no Patient Instructions on file for this visit.   Signed, [name], PA-C  07/13/2019 11:44 AM    [facility] [address] [place], [place] [address] Phone: [phone] Fax: [phone]

## 2019-07-22 ENCOUNTER — Telehealth: Payer: Self-pay | Admitting: Physical Therapy

## 2019-07-22 NOTE — Telephone Encounter (Signed)
Copied from [place] [id number]. Topic: General - Other >> Jul 22, 2019  3:34 PM [place] Quan A wrote: Reason for CRM: Patient called to inform Inda Coke that her job will be ordering the covid vaccine for them and wanted to know if its a good idea for her to go ahead and get the vaccine or not. Please call patient at Ph# [phone] or [phone]

## 2019-07-23 NOTE — Telephone Encounter (Signed)
Spoke to pt told her Aldona Bar said she does recommend you get the COVID vaccine. Pt verbalized understanding.

## 2019-07-27 ENCOUNTER — Ambulatory Visit (INDEPENDENT_AMBULATORY_CARE_PROVIDER_SITE_OTHER): Payer: PRIVATE HEALTH INSURANCE | Admitting: Obstetrics and Gynecology

## 2019-07-27 ENCOUNTER — Other Ambulatory Visit: Payer: Self-pay

## 2019-07-27 VITALS — BP 114/71 | HR 105 | Ht 66.0 in | Wt 210.2 lb

## 2019-07-27 DIAGNOSIS — Z6835 Body mass index (BMI) 35.0-35.9, adult: Secondary | ICD-10-CM

## 2019-07-27 NOTE — Progress Notes (Signed)
[facility] Visit  @DATE @            Patient name: Robin [name] MRN [id number]  Date of birth: Jul 05, 1966  CC & HPI:  Robin [name] is a 53 y.o. female presenting today for incision check.  Ms. Gaunce is now 7 years status post abdominal hysterectomy with abdominal scar revision.  It is done fine until recently.  She had some breakdown right in the central midline area.  She has gained some weight and has fairly significant pannus.  There was a 1 cm area of breakdown which she has a photo in her camera and will send to my chart as an attachment.  Today it still tender but is completely healed.  ROS:  ROS Lengthy medical history with recent evaluations for chronic systolic heart failure, congestive heart failure nicotine and cocaine abuse,, thoracic aortic aneurysm, hiatal hernia.  She has an evaluation of her aneurysm scheduled with cardiology  Pertinent History Reviewed:   Reviewed: Significant for weight gain Medical         Past Medical History:  Diagnosis Date  . Allergy   . Anemia   . Arthritis   . CHF (congestive heart failure) ([place])    a. EF at 30-35% by echo in 08/2018 b. EF at 35% by repeat echo in 05/2019  . Chronic abdominal pain   . COPD (chronic obstructive pulmonary disease) ([place])   . Drug use   . Essential hypertension, benign   . GERD (gastroesophageal reflux disease)   . Gout 2016  . Normal coronary arteries    3/10 - following abnormal Myoview  . Ovarian cyst   . Type 2 diabetes mellitus ([place])                               Surgical Hx:    Past Surgical History:  Procedure Laterality Date  . ABDOMINAL HYSTERECTOMY  09/10/2011   Procedure: HYSTERECTOMY ABDOMINAL;  Surgeon: Jonnie Kind, MD;  Location: [facility];  Service: Gynecology;  Laterality: N/A;  Abdominal hysterectomy  . CESAREAN SECTION  [id number], and 1994  . CHOLECYSTECTOMY  1995  . SCAR REVISION  09/10/2011   Procedure: SCAR REVISION;  Surgeon: Jonnie Kind, MD;  Location: [facility];   Service: Gynecology;  Laterality: N/A;  Wide Excision of old Cicatrix  . TUBAL LIGATION  1994   Medications: Reviewed & Updated - see associated section                       Current Outpatient Medications:  .  acetaminophen (TYLENOL) 325 MG tablet, Take 2 tablets (650 mg total) by mouth every 6 (six) hours as needed for mild pain, fever or headache (or Fever >/= 101)., Disp: 12 tablet, Rfl: 0 .  acetaminophen (TYLENOL) 500 MG tablet, Take 1,000 mg by mouth every 6 (six) hours as needed (for pain.)., Disp: , Rfl:  .  albuterol (PROVENTIL) (2.5 MG/3ML) 0.083% nebulizer solution, Take 3 mLs (2.5 mg total) by nebulization every 6 (six) hours as needed for wheezing or shortness of breath., Disp: 75 mL, Rfl: 5 .  albuterol (VENTOLIN HFA) 108 (90 Base) MCG/ACT inhaler, Inhale 2 puffs into the lungs every 6 (six) hours as needed for wheezing or shortness of breath., Disp: 18 g, Rfl: 5 .  apixaban (ELIQUIS) 5 MG TABS tablet, Take 1 tablet (5 mg total) by mouth 2 (two) times  daily., Disp: 60 tablet, Rfl: 5 .  carvedilol (COREG) 25 MG tablet, Take 1.5 tablets (37.5 mg total) by mouth 2 (two) times daily with a meal., Disp: 90 tablet, Rfl: 5 .  digoxin (LANOXIN) 0.125 MG tablet, Take 1 tablet (0.125 mg total) by mouth daily., Disp: 30 tablet, Rfl: 5 .  doxycycline (VIBRA-TABS) 100 MG tablet, Take 1 tablet (100 mg total) by mouth 2 (two) times daily., Disp: 20 tablet, Rfl: 0 .  gabapentin (NEURONTIN) 100 MG capsule, Take 1 capsule (100 mg total) by mouth 3 (three) times daily., Disp: 90 capsule, Rfl: 5 .  glucose blood (CONTOUR NEXT TEST) test strip, 2x daily, Disp: 100 each, Rfl: 12 .  hydrALAZINE (APRESOLINE) 50 MG tablet, Take 1 tablet (50 mg total) by mouth 3 (three) times daily., Disp: 90 tablet, Rfl: 5 .  HYDROcodone-acetaminophen (NORCO/VICODIN) 5-325 MG tablet, Take 1 tablet by mouth every 4 (four) hours as needed for severe pain., Disp: 10 tablet, Rfl: 0 .  insulin aspart protamine- aspart (NOVOLOG  MIX 70/30) (70-30) 100 UNIT/ML injection, Take 18 units 2 times a day with meals (Patient taking differently: Inject 18 Units into the skin 2 (two) times daily. Take 18 units 2 times a day with meals), Disp: 10 mL, Rfl: 11 .  metFORMIN (GLUCOPHAGE) 1000 MG tablet, Take 1 tablet (1,000 mg total) by mouth daily with breakfast., Disp: 90 tablet, Rfl: 3 .  pantoprazole (PROTONIX) 40 MG tablet, Take 1 tablet (40 mg total) by mouth daily., Disp: 30 tablet, Rfl: 5 .  potassium chloride SA (KLOR-CON) 20 MEQ tablet, Take 1 tablet (20 mEq total) by mouth 2 (two) times daily., Disp: 60 tablet, Rfl: 3 .  torsemide (DEMADEX) 20 MG tablet, Take 1 tablet (20 mg total) by mouth 2 (two) times daily., Disp: 60 tablet, Rfl: 5   Social History: Reviewed -  reports that she has quit smoking. Her smoking use included cigarettes. She has a 7.25 pack-year smoking history. She has never used smokeless tobacco.  Objective Findings:  Vitals: Blood pressure 114/71, pulse (!) 105, height 5\' 6"  (1.676 m), weight 210 lb 3.2 oz (95.3 kg), last menstrual period 08/21/2011.  PHYSICAL EXAMINATION General appearance - alert, well appearing, and in no distress, oriented to [place], place, and time and overweight Mental status - alert, oriented to [place], place, and time, normal mood, behavior, speech, dress, motor activity, and thought processes Chest - clear to auscultation, no wheezes, rales or rhonchi, symmetric air entry Heart - normal rate and regular rhythm Abdomen - soft, nontender, nondistended, no masses or organomegaly tenderness noted in the old scar line beneath the pannus in the midline.  There is evidence of recent healing skin is darker in this area as is the case with poorly vascularized areas Breasts -  Skin -no breakdown but there is subjective tenderness in this area.  No local heat no erythema  P   Assessment & Plan:   A:  1. Recently healed skin in an area of old scar line,, still sensitive  P:   Follow-up for now patient to use nonpressure dressing such as sanitary napkin in this area to reduce pressure from the pannus If it breaks down again patient is notify us and take photos and document, it might require revision of the scar to improve healing Patient aware that cigarette smoking cocaine etc. as well as weight are contributory factors to the poor healing and tissue breakdown after 7 years

## 2019-08-16 ENCOUNTER — Telehealth: Payer: Self-pay | Admitting: Physician Assistant

## 2019-08-16 NOTE — Telephone Encounter (Signed)
Patient called in saying she keeps getting mychart messages but is unable to access it, and wanted to know if we were calling. Also wanted to know if it was okay to do a virtual visit to speak about her blood sugar levels instead of coming in the office.

## 2019-08-16 NOTE — Telephone Encounter (Signed)
Spoke to pt told her discussed with Robin [name] and you should be contacting Endocrinology for your blood sugars cause they are following you for that. Pt verbalized understanding and asked for the phone number. Gave pt the phone number and told her to contact them regarding sugars and I will cancel appt for tomorrow. Pt verbalized understanding.

## 2019-08-17 ENCOUNTER — Ambulatory Visit: Payer: PRIVATE HEALTH INSURANCE | Admitting: Physician Assistant

## 2019-08-25 ENCOUNTER — Other Ambulatory Visit: Payer: Self-pay

## 2019-08-26 NOTE — Progress Notes (Signed)
Name: Robin [name]  Age/ Sex: 54 y.o., female   MRN/ DOB: [id number], 02-01-1966     PCP: Inda Coke, PA   Reason for Endocrinology Evaluation: Type 2 Diabetes Mellitus  Initial Endocrine Consultative Visit: 10/01/2018    PATIENT IDENTIFIER: [name] is a 54 y.o. female with a past medical history of T2DM, CHF, COPD and GERD. The patient has followed with Endocrinology clinic since 10/01/2018 for consultative assistance with management of her diabetes.  DIABETIC HISTORY:  [name] was diagnosed with DM > 20 yrs ago. Has been on Janumet and Glipizide in the past. Has been on insulin for years. Her hemoglobin A1c has ranged from 7.9% in 2017, peaking at 11.3% in [id number]  Stopped Trulicity due to cost in 09/2018  SUBJECTIVE:   During the last visit (06/09/2019): A1c 12.0% , pt with continued non adherence. Decreased metformin due to AKI and continued novolog mix .    Today (08/27/2019): Ms. [name] is here for a follow up on diabetes management.. She checks her blood sugars 2 times daily. The patient has not had hypoglycemic episodes since the last clinic visit.           ROS: As per HPI and as detailed below: Review of Systems  Constitutional: Negative for chills and fever.  Respiratory: Positive for shortness of breath. Negative for cough.   Cardiovascular: Negative for chest pain and palpitations.  Gastrointestinal: Negative for diarrhea and nausea.      HOME DIABETES REGIMEN:  Metformin 1000 mg twice a day  Novolog mix 25 units BID     METER DOWNLOAD SUMMARY: Unable to download 164-300 mg/dL    DIABETIC COMPLICATIONS: Microvascular complications:   Neuropathy  Denies: CKD, retinopathy   Last eye exam: Completed many years ago    Macrovascular complications:   Denies: CAD, PVD, CVA    HISTORY:  Past Medical History:  Past Medical History:  Diagnosis Date  . Allergy   . Anemia   . Arthritis   . CHF (congestive heart failure)  ([place])    a. EF at 30-35% by echo in 08/2018 b. EF at 35% by repeat echo in 05/2019  . Chronic abdominal pain   . COPD (chronic obstructive pulmonary disease) ([place])   . Drug use   . Essential hypertension, benign   . GERD (gastroesophageal reflux disease)   . Gout 2016  . Normal coronary arteries    3/10 - following abnormal Myoview  . Ovarian cyst   . Type 2 diabetes mellitus ([place])    Past Surgical History:  Past Surgical History:  Procedure Laterality Date  . ABDOMINAL HYSTERECTOMY  09/10/2011   Procedure: HYSTERECTOMY ABDOMINAL;  Surgeon: Jonnie Kind, MD;  Location: [facility];  Service: Gynecology;  Laterality: N/A;  Abdominal hysterectomy  . CESAREAN SECTION  [id number], and 1994  . CHOLECYSTECTOMY  1995  . SCAR REVISION  09/10/2011   Procedure: SCAR REVISION;  Surgeon: Jonnie Kind, MD;  Location: [facility];  Service: Gynecology;  Laterality: N/A;  Wide Excision of old Cicatrix  . TUBAL LIGATION  1994    Social History:  reports that she has quit smoking. Her smoking use included cigarettes. She has a 7.25 pack-year smoking history. She has never used smokeless tobacco. She reports previous alcohol use. She reports previous drug use. Drugs: Cocaine and Marijuana. Family History:  Family History  Problem Relation Age of Onset  . Cirrhosis Mother   . Early death Mother   . Alcohol  abuse Mother   . Diabetes type II Father   . Alcohol abuse Father   . Diabetes type II Sister   . Early death Brother   . Alcohol abuse Son   . Anesthesia problems Neg Hx   . Hypotension Neg Hx   . Malignant hyperthermia Neg Hx   . Pseudochol deficiency Neg Hx   . Colon cancer Neg Hx   . Colon polyps Neg Hx   . Esophageal cancer Neg Hx   . Rectal cancer Neg Hx   . Stomach cancer Neg Hx      HOME MEDICATIONS: Allergies as of 08/27/2019      Reactions   Bee Venom Shortness Of Breath, Swelling   Bodily Swelling   Losartan Other (See Comments)   Nosebleeds per patient report.    Naproxen  Other (See Comments)   Effects acid reflux   Penicillins Nausea Only   Has patient had a PCN reaction causing immediate rash, facial/tongue/throat swelling, SOB or lightheadedness with hypotension: no Has patient had a PCN reaction causing severe rash involving mucus membranes or skin necrosis: no Has patient had a PCN reaction that required hospitalization no Has patient had a PCN reaction occurring within the last 10 years: no If all of the above answers are "NO", then may proceed with Cephalosporin    Lisinopril    Sinus congestion      Medication List       Accurate as of August 27, 2019  7:28 AM. If you have any questions, ask your nurse or doctor.        acetaminophen 500 MG tablet Commonly known as: TYLENOL Take 1,000 mg by mouth every 6 (six) hours as needed (for pain.).   acetaminophen 325 MG tablet Commonly known as: TYLENOL Take 2 tablets (650 mg total) by mouth every 6 (six) hours as needed for mild pain, fever or headache (or Fever >/= 101).   albuterol (2.5 MG/3ML) 0.083% nebulizer solution Commonly known as: PROVENTIL Take 3 mLs (2.5 mg total) by nebulization every 6 (six) hours as needed for wheezing or shortness of breath.   albuterol 108 (90 Base) MCG/ACT inhaler Commonly known as: VENTOLIN HFA Inhale 2 puffs into the lungs every 6 (six) hours as needed for wheezing or shortness of breath.   apixaban 5 MG Tabs tablet Commonly known as: ELIQUIS Take 1 tablet (5 mg total) by mouth 2 (two) times daily.   carvedilol 25 MG tablet Commonly known as: COREG Take 1.5 tablets (37.5 mg total) by mouth 2 (two) times daily with a meal.   Contour Next Test test strip Generic drug: glucose blood 2x daily   digoxin 0.125 MG tablet Commonly known as: LANOXIN Take 1 tablet (0.125 mg total) by mouth daily.   doxycycline 100 MG tablet Commonly known as: VIBRA-TABS Take 1 tablet (100 mg total) by mouth 2 (two) times daily.   gabapentin 100 MG capsule Commonly  known as: NEURONTIN Take 1 capsule (100 mg total) by mouth 3 (three) times daily.   hydrALAZINE 50 MG tablet Commonly known as: APRESOLINE Take 1 tablet (50 mg total) by mouth 3 (three) times daily.   HYDROcodone-acetaminophen 5-325 MG tablet Commonly known as: NORCO/VICODIN Take 1 tablet by mouth every 4 (four) hours as needed for severe pain.   insulin aspart protamine- aspart (70-30) 100 UNIT/ML injection Commonly known as: NovoLOG Mix 70/30 Take 18 units 2 times a day with meals What changed:   how much to take  how to take  this  when to take this   metFORMIN 1000 MG tablet Commonly known as: Glucophage Take 1 tablet (1,000 mg total) by mouth daily with breakfast.   pantoprazole 40 MG tablet Commonly known as: PROTONIX Take 1 tablet (40 mg total) by mouth daily.   potassium chloride SA 20 MEQ tablet Commonly known as: KLOR-CON Take 1 tablet (20 mEq total) by mouth 2 (two) times daily.   torsemide 20 MG tablet Commonly known as: DEMADEX Take 1 tablet (20 mg total) by mouth 2 (two) times daily.        OBJECTIVE:   Vital Signs: LMP 08/21/2011   Wt Readings from Last 3 Encounters:  07/27/19 210 lb 3.2 oz (95.3 kg)  06/29/19 210 lb (95.3 kg)  06/28/19 210 lb (95.3 kg)     Exam: General: Pt appears well and is in NAD  Lungs: Clear with good BS bilat with no rales, rhonchi, or wheezes  Heart: RRR with normal S1 and S2 and no gallops; no murmurs; no rub  Abdomen: Normoactive bowel sounds, soft, nontender  Extremities: No pretibial edema.   Neuro: MS is good with appropriate affect, pt is alert and Ox3    DM foot exam:06/09/2019  The skin of the feet is intact without sores or ulcerations. The pedal pulses are undetectable The sensation is intact to a screening 5.07, 10 gram monofilament bilaterally            DATA REVIEWED:  Lab Results  Component Value Date   HGBA1C 11.4 (H) 06/28/2019   HGBA1C 12.0 (H) 05/10/2019   HGBA1C 11.3 (H) 08/24/2018    Lab Results  Component Value Date   MICROALBUR 2.0 (H) 06/09/2019   LDLCALC 55 06/09/2019   CREATININE 1.36 (H) 06/28/2019   Lab Results  Component Value Date   MICRALBCREAT 1.0 06/09/2019     Lab Results  Component Value Date   CHOL 112 06/09/2019   HDL 29.90 (L) 06/09/2019   LDLCALC 55 06/09/2019   TRIG 131.0 06/09/2019   CHOLHDL 4 06/09/2019        Results for Kendall, Tanelle A (MRN [id number]) as of 06/09/2019 13:46  Ref. Range 06/09/2019 08:18  Sodium Latest Ref Range: 135 - 145 mEq/L 140  Potassium Latest Ref Range: 3.5 - 5.1 mEq/L 4.0  Chloride Latest Ref Range: 96 - 112 mEq/L 102  CO2 Latest Ref Range: 19 - 32 mEq/L 30  Glucose Latest Ref Range: 70 - 99 mg/dL 128 (H)  BUN Latest Ref Range: 6 - 23 mg/dL 29 (H)  Creatinine Latest Ref Range: 0.40 - 1.20 mg/dL 1.68 (H)  Calcium Latest Ref Range: 8.4 - 10.5 mg/dL 9.0  GFR Latest Ref Range: >60.00 mL/min 38.58 (L)   ASSESSMENT / PLAN / RECOMMENDATIONS:   1) Type 2 Diabetes Mellitus, Poorly controlled, With Neuropathic complications - Most recent A1c of 11.4 %. Goal A1c < 7.0 %.    - Poorly controlled diabetes, she is doing better with medication adherence but continues with dietary indiscretions, she has been drinking cranberry juice, she snacks on fruits through the day .  - Pt agreed to a referral to our RD.  -Limited add-on therapy due to low GFR  - We discussed the importance of dietary changes in glycemic control.  - She has increased novolog mix dose from 18 to 25 units, pt cautioned against increasing insulin on her own.   MEDICATIONS:  Metformin 1000 mg once tablet daily  Increase Novolog Mix to 30 units BID with Meals  EDUCATION / INSTRUCTIONS:  BG monitoring instructions: Patient is instructed to check her blood sugars 2 times a day, fasting and supper.  Call [facility] if: BG persistently < 70 or > 300. . I reviewed the Rule of 15 for the treatment of hypoglycemia in detail with the  patient. Literature supplied.   2) Diabetic complications:   Eye: Does not have known diabetic retinopathy.   Neuro/ Feet: Does  have known diabetic peripheral neuropathy .   Renal: Patient does not have known baseline CKD.       F/U in 3 months    Addendum: labs discussed with the pt over the phone at 1330 on 06/09/2019  Signed electronically by: Mack Guise, MD  [facility]  [facility] [address] [place], [place] [address] Phone: [phone] FAX: [phone]   CC: Inda Coke, [address] [place] [address] Phone: [phone]  Fax: [phone]  Return to Endocrinology clinic as below: Future Appointments  Date Time Provider [place]  08/27/2019  7:50 AM Deshaun Schou, Melanie Crazier, MD [facility] None  09/13/2019  7:30 AM Duchess Armendarez, Melanie Crazier, MD [facility] None

## 2019-08-27 ENCOUNTER — Encounter: Payer: Self-pay | Admitting: Internal Medicine

## 2019-08-27 ENCOUNTER — Other Ambulatory Visit: Payer: Self-pay

## 2019-08-27 ENCOUNTER — Ambulatory Visit (INDEPENDENT_AMBULATORY_CARE_PROVIDER_SITE_OTHER): Payer: 59 | Admitting: Internal Medicine

## 2019-08-27 VITALS — BP 110/78 | HR 100 | Temp 98.7°F | Ht 66.0 in | Wt 214.4 lb

## 2019-08-27 DIAGNOSIS — Z794 Long term (current) use of insulin: Secondary | ICD-10-CM | POA: Diagnosis not present

## 2019-08-27 DIAGNOSIS — E1165 Type 2 diabetes mellitus with hyperglycemia: Secondary | ICD-10-CM | POA: Diagnosis not present

## 2019-08-27 MED ORDER — NOVOLOG MIX 70/30 FLEXPEN (70-30) 100 UNIT/ML ~~LOC~~ SUPN: 30.0000 [IU] | PEN_INJECTOR | Freq: Two times a day (BID) | SUBCUTANEOUS | 6 refills | Status: DC

## 2019-08-27 MED ORDER — INSULIN PEN NEEDLE 32G X 4 MM MISC: 1.0000 | 6 refills | Status: DC

## 2019-08-27 NOTE — Patient Instructions (Addendum)
-   Continue Metformin 1000 mg with Breakfast  - Increase Novolog Mix to 30 units with Breakfast and Dinner      HOW TO TREAT LOW BLOOD SUGARS (Blood sugar LESS THAN 70 MG/DL)  Please follow the RULE OF 15 for the treatment of hypoglycemia treatment (when your (blood sugars are less than 70 mg/dL)    STEP 1: Take 15 grams of carbohydrates when your blood sugar is low, which includes:   3-4 GLUCOSE TABS  OR  3-4 OZ OF JUICE OR REGULAR SODA OR  ONE TUBE OF GLUCOSE GEL     STEP 2: RECHECK blood sugar in 15 MINUTES STEP 3: If your blood sugar is still low at the 15 minute recheck --> then, go back to STEP 1 and treat AGAIN with another 15 grams of carbohydrates.

## 2019-09-09 ENCOUNTER — Other Ambulatory Visit: Payer: Self-pay

## 2019-09-13 ENCOUNTER — Other Ambulatory Visit: Payer: Self-pay

## 2019-09-13 ENCOUNTER — Encounter: Payer: Self-pay | Admitting: Internal Medicine

## 2019-09-13 ENCOUNTER — Ambulatory Visit (INDEPENDENT_AMBULATORY_CARE_PROVIDER_SITE_OTHER): Payer: 59 | Admitting: Internal Medicine

## 2019-09-13 VITALS — BP 118/78 | HR 102 | Temp 98.0°F | Ht 66.0 in | Wt 218.0 lb

## 2019-09-13 DIAGNOSIS — E1165 Type 2 diabetes mellitus with hyperglycemia: Secondary | ICD-10-CM

## 2019-09-13 DIAGNOSIS — Z794 Long term (current) use of insulin: Secondary | ICD-10-CM | POA: Diagnosis not present

## 2019-09-13 DIAGNOSIS — E1122 Type 2 diabetes mellitus with diabetic chronic kidney disease: Secondary | ICD-10-CM

## 2019-09-13 DIAGNOSIS — E1121 Type 2 diabetes mellitus with diabetic nephropathy: Secondary | ICD-10-CM | POA: Diagnosis not present

## 2019-09-13 DIAGNOSIS — E1143 Type 2 diabetes mellitus with diabetic autonomic (poly)neuropathy: Secondary | ICD-10-CM

## 2019-09-13 DIAGNOSIS — N1831 Chronic kidney disease, stage 3a: Secondary | ICD-10-CM

## 2019-09-13 LAB — POCT GLYCOSYLATED HEMOGLOBIN (HGB A1C): Hemoglobin A1C: 10.3 % — AB (ref 4.0–5.6)

## 2019-09-13 MED ORDER — TRULICITY 0.75 MG/0.5ML ~~LOC~~ SOAJ: 0.7500 mg | SUBCUTANEOUS | 3 refills | Status: DC

## 2019-09-13 NOTE — Patient Instructions (Signed)
-   Continue Metformin 1000 mg with Breakfast  - Novolog Mix  24 units with Breakfast and 30 units with Dinner - Trulicity [id number] mg weekly     HOW TO TREAT LOW BLOOD SUGARS (Blood sugar LESS THAN 70 MG/DL)  Please follow the RULE OF 15 for the treatment of hypoglycemia treatment (when your (blood sugars are less than 70 mg/dL)    STEP 1: Take 15 grams of carbohydrates when your blood sugar is low, which includes:   3-4 GLUCOSE TABS  OR  3-4 OZ OF JUICE OR REGULAR SODA OR  ONE TUBE OF GLUCOSE GEL     STEP 2: RECHECK blood sugar in 15 MINUTES STEP 3: If your blood sugar is still low at the 15 minute recheck --> then, go back to STEP 1 and treat AGAIN with another 15 grams of carbohydrates.

## 2019-09-13 NOTE — Progress Notes (Signed)
Name: Robin [name]  Age/ Sex: 54 y.o., female   MRN/ DOB: [id number], [birth year]     PCP: Inda Coke, PA   Reason for Endocrinology Evaluation: Type 2 Diabetes Mellitus  Initial Endocrine Consultative Visit: 10/01/2018    PATIENT IDENTIFIER: Robin [name] is a 54 y.o. female with a past medical history of T2DM, CHF, COPD and GERD. The patient has followed with Endocrinology clinic since 10/01/2018 for consultative assistance with management of her diabetes.  DIABETIC HISTORY:  Robin [name] was diagnosed with DM > 20 yrs ago. Has been on Janumet and Glipizide in the past. Has been on insulin for years. Her hemoglobin A1c has ranged from 7.9% in 2017, peaking at 11.3% in [id number]  Stopped Trulicity due to cost in 09/2018  SUBJECTIVE:   During the last visit (08/27/2019): A1c 11.4 % ,we continued metformin and increase Novolog mix.   Today (09/14/2019): Robin [name] is here for a follow up on diabetes management.. She checks her blood sugars 2 times daily. The patient has not had hypoglycemic episodes on the meter, but she does endorse symptoms of shaking between breakfast and lunch.   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 She started herba light diet but she has not noticed any weight loss, on the contrary she has been gaining weight.  In detail questioning about this diet patient eats a regular breakfast, then she will snack, she will have a meal replacement for lunch followed by another snack followed by another meal replacement for dinner followed by another snack.  ROS: As per HPI and as detailed below: Review of Systems  Constitutional: Negative for chills and fever.  Respiratory: Negative  for cough and shortness of breath.   Cardiovascular: Negative for chest pain and palpitations.  Gastrointestinal: Negative for diarrhea and nausea.      HOME DIABETES REGIMEN:  Metformin 1000 mg twice a day  Novolog mix 30 units BID     METER DOWNLOAD SUMMARY: Unable to download 167-314 mg/dL    DIABETIC COMPLICATIONS: Microvascular complications:   Neuropathy  Denies: CKD, retinopathy   Last eye exam: Completed many years ago    Macrovascular complications:   Denies: CAD, PVD, CVA    HISTORY:  Past Medical History:  Past Medical History:  Diagnosis Date  . Allergy   . Anemia   . Arthritis   . CHF (congestive heart failure) ([place])    a. EF at 30-35% by echo in 08/2018 b. EF at 35% by repeat echo in 05/2019  . Chronic abdominal pain   . COPD (chronic obstructive pulmonary disease) ([place])   . Drug use   . Essential hypertension, benign   . GERD (gastroesophageal reflux disease)   . Gout 2016  . Normal coronary arteries    3/10 - following abnormal Myoview  . Ovarian cyst   . Type 2 diabetes mellitus ([place])    Past Surgical History:  Past Surgical History:  Procedure Laterality Date  . ABDOMINAL HYSTERECTOMY  09/10/2011   Procedure: HYSTERECTOMY ABDOMINAL;  Surgeon: Jonnie Kind, MD;  Location: [facility];  Service: Gynecology;  Laterality: N/A;  Abdominal hysterectomy  .  CESAREAN SECTION  [id number], and 1994  . CHOLECYSTECTOMY  1995  . SCAR REVISION  09/10/2011   Procedure: SCAR REVISION;  Surgeon: Jonnie Kind, MD;  Location: [facility];  Service: Gynecology;  Laterality: N/A;  Wide Excision of old Cicatrix  . TUBAL LIGATION  1994    Social History:  reports that she has quit smoking. Her smoking use included cigarettes. She has a 7.25 pack-year smoking history. She has never used smokeless tobacco. She reports previous alcohol use. She reports previous drug use. Drugs: Cocaine and Marijuana. Family History:  Family History  Problem Relation Age of Onset  .  Cirrhosis Mother   . Early death Mother   . Alcohol abuse Mother   . Diabetes type II Father   . Alcohol abuse Father   . Diabetes type II Sister   . Early death Brother   . Alcohol abuse Son   . Anesthesia problems Neg Hx   . Hypotension Neg Hx   . Malignant hyperthermia Neg Hx   . Pseudochol deficiency Neg Hx   . Colon cancer Neg Hx   . Colon polyps Neg Hx   . Esophageal cancer Neg Hx   . Rectal cancer Neg Hx   . Stomach cancer Neg Hx      HOME MEDICATIONS: Allergies as of 09/13/2019      Reactions   Bee Venom Shortness Of Breath, Swelling   Bodily Swelling   Losartan Other (See Comments)   Nosebleeds per patient report.    Naproxen Other (See Comments)   Effects acid reflux   Penicillins Nausea Only   Has patient had a PCN reaction causing immediate rash, facial/tongue/throat swelling, SOB or lightheadedness with hypotension: no Has patient had a PCN reaction causing severe rash involving mucus membranes or skin necrosis: no Has patient had a PCN reaction that required hospitalization no Has patient had a PCN reaction occurring within the last 10 years: no If all of the above answers are "NO", then may proceed with Cephalosporin    Lisinopril    Sinus congestion      Medication List       Accurate as of September 13, 2019 11:59 PM. If you have any questions, ask your nurse or doctor.        acetaminophen 500 MG tablet Commonly known as: TYLENOL Take 1,000 mg by mouth every 6 (six) hours as needed (for pain.).   acetaminophen 325 MG tablet Commonly known as: TYLENOL Take 2 tablets (650 mg total) by mouth every 6 (six) hours as needed for mild pain, fever or headache (or Fever >/= 101).   albuterol (2.5 MG/3ML) 0.083% nebulizer solution Commonly known as: PROVENTIL Take 3 mLs (2.5 mg total) by nebulization every 6 (six) hours as needed for wheezing or shortness of breath.   albuterol 108 (90 Base) MCG/ACT inhaler Commonly known as: VENTOLIN HFA Inhale 2 puffs  into the lungs every 6 (six) hours as needed for wheezing or shortness of breath.   apixaban 5 MG Tabs tablet Commonly known as: ELIQUIS Take 1 tablet (5 mg total) by mouth 2 (two) times daily.   carvedilol 25 MG tablet Commonly known as: COREG Take 1.5 tablets (37.5 mg total) by mouth 2 (two) times daily with a meal.   Contour Next Test test strip Generic drug: glucose blood 2x daily   digoxin 0.125 MG tablet Commonly known as: LANOXIN Take 1 tablet (0.125 mg total) by mouth daily.   doxycycline 100 MG tablet Commonly known as:  VIBRA-TABS Take 1 tablet (100 mg total) by mouth 2 (two) times daily.   gabapentin 100 MG capsule Commonly known as: NEURONTIN Take 1 capsule (100 mg total) by mouth 3 (three) times daily.   hydrALAZINE 50 MG tablet Commonly known as: APRESOLINE Take 1 tablet (50 mg total) by mouth 3 (three) times daily.   HYDROcodone-acetaminophen 5-325 MG tablet Commonly known as: NORCO/VICODIN Take 1 tablet by mouth every 4 (four) hours as needed for severe pain.   Insulin Pen Needle 32G X 4 MM Misc 1 Device by Does not apply route as directed.   metFORMIN 1000 MG tablet Commonly known as: Glucophage Take 1 tablet (1,000 mg total) by mouth daily with breakfast.   NovoLOG Mix 70/30 FlexPen (70-30) 100 UNIT/ML FlexPen Generic drug: insulin aspart protamine - aspart Inject 0.3 mLs (30 Units total) into the skin 2 (two) times daily.   pantoprazole 40 MG tablet Commonly known as: PROTONIX Take 1 tablet (40 mg total) by mouth daily.   potassium chloride SA 20 MEQ tablet Commonly known as: KLOR-CON Take 1 tablet (20 mEq total) by mouth 2 (two) times daily.   torsemide 20 MG tablet Commonly known as: DEMADEX Take 1 tablet (20 mg total) by mouth 2 (two) times daily.   Trulicity A999333 0000000 Sopn Generic drug: Dulaglutide Inject 0.75 mg into the skin once a week. Started by: Dorita Sciara, MD        OBJECTIVE:   Vital Signs: BP 118/78 (BP  Location: Left Arm, Patient Position: Sitting, Cuff Size: Large)   Pulse (!) 102   Temp 98 F (36.7 C)   Ht 5\' 6"  (1.676 m)   Wt 218 lb (98.9 kg)   LMP 08/21/2011   SpO2 98%   BMI 35.19 kg/m   Wt Readings from Last 3 Encounters:  09/13/19 218 lb (98.9 kg)  08/27/19 214 lb 6.4 oz (97.3 kg)  07/27/19 210 lb 3.2 oz (95.3 kg)     Exam: General: Pt appears well and is in NAD  Lungs: Clear with good BS bilat with no rales, rhonchi, or wheezes  Heart: RRR with normal S1 and S2 and no gallops; no murmurs; no rub  Extremities: No pretibial edema.   Neuro: MS is good with appropriate affect, pt is alert and Ox3    DM foot exam:06/09/2019   The skin of the feet is intact without sores or ulcerations. The pedal pulses are undetectable The sensation is intact to a screening 5.07, 10 gram monofilament bilaterally            DATA REVIEWED:  Lab Results  Component Value Date   HGBA1C 10.3 (A) 09/13/2019   HGBA1C 11.4 (H) 06/28/2019   HGBA1C 12.0 (H) 05/10/2019   Lab Results  Component Value Date   MICROALBUR 2.0 (H) 06/09/2019   LDLCALC 55 06/09/2019   CREATININE 1.36 (H) 06/28/2019   Lab Results  Component Value Date   MICRALBCREAT 1.0 06/09/2019     Lab Results  Component Value Date   CHOL 112 06/09/2019   HDL 29.90 (L) 06/09/2019   LDLCALC 55 06/09/2019   TRIG 131.0 06/09/2019   CHOLHDL 4 06/09/2019        Results for Debruhl, Zabella A (MRN [id number]) as of 06/09/2019 13:46  Ref. Range 06/09/2019 08:18  Sodium Latest Ref Range: 135 - 145 mEq/L 140  Potassium Latest Ref Range: 3.5 - 5.1 mEq/L 4.0  Chloride Latest Ref Range: 96 - 112 mEq/L 102  CO2 Latest Ref Range:  19 - 32 mEq/L 30  Glucose Latest Ref Range: 70 - 99 mg/dL 128 (H)  BUN Latest Ref Range: 6 - 23 mg/dL 29 (H)  Creatinine Latest Ref Range: 0.40 - 1.20 mg/dL 1.68 (H)  Calcium Latest Ref Range: 8.4 - 10.5 mg/dL 9.0  GFR Latest Ref Range: >60.00 mL/min 38.58 (L)   ASSESSMENT / PLAN /  RECOMMENDATIONS:   1) Type 2 Diabetes Mellitus, Poorly controlled, With Neuropathic and CKD III complications - Most recent A1c of 10.3 %. Goal A1c < 7.0 %.    -Her glycemic control continues to slowly improve but she continues with fluctuating BG's. -I have expressed my concern about her new diet, as it seems like she snacks more than she should she will hold off on this diet until she meets with our RD. -Limited add-on therapy due to low GFR  -Given her symptoms of shaking sometime between breakfast and lunch I will adjust her insulin dose as below. -She is interested in restarting a GLP-1 agonist, in the past her insurance will not Cover for it, I have discussed this with her but she states she will try and pay for this time  MEDICATIONS:  Metformin 1000 mg once tablet daily  Novolog Mix 24 units with breakfast and 30 units with supper  Trulicity A999333 mg weekly  EDUCATION / INSTRUCTIONS:  BG monitoring instructions: Patient is instructed to check her blood sugars 2 times a day, fasting and supper.  Call [facility] if: BG persistently < 70 or > 300. . I reviewed the Rule of 15 for the treatment of hypoglycemia in detail with the patient. Literature supplied.   2) Diabetic complications:   Eye: Does not have known diabetic retinopathy.  Patient strongly encouraged to have an eye exam ASAP  Neuro/ Feet: Does  have known diabetic peripheral neuropathy .   Renal: Patient does  have known baseline CKD III       F/U in 3 months     Signed electronically by: Mack Guise, MD  [facility]  [facility] [address] [place], [place] [address] Phone: [phone] FAX: [phone]   CC: Inda Coke, [place] [address] Phone: [phone]  Fax: [phone]  Return to Endocrinology clinic as below: Future Appointments  Date Time Provider [place]  09/30/2019  9:00  AM Clydell Hakim, RD [place] [facility]  11/25/2019  7:30 AM Linken Mcglothen, Melanie Crazier, MD [facility] None  12/13/2019  7:30 AM Alajia Schmelzer, Melanie Crazier, MD [facility] None

## 2019-09-14 DIAGNOSIS — Z794 Long term (current) use of insulin: Secondary | ICD-10-CM | POA: Insufficient documentation

## 2019-09-14 DIAGNOSIS — E1122 Type 2 diabetes mellitus with diabetic chronic kidney disease: Secondary | ICD-10-CM | POA: Insufficient documentation

## 2019-09-16 ENCOUNTER — Telehealth (INDEPENDENT_AMBULATORY_CARE_PROVIDER_SITE_OTHER): Payer: 59 | Admitting: Family Medicine

## 2019-09-16 ENCOUNTER — Encounter: Payer: Self-pay | Admitting: Family Medicine

## 2019-09-16 ENCOUNTER — Other Ambulatory Visit: Payer: Self-pay

## 2019-09-16 DIAGNOSIS — M546 Pain in thoracic spine: Secondary | ICD-10-CM

## 2019-09-16 MED ORDER — TIZANIDINE HCL 2 MG PO CAPS: 2.0000 mg | ORAL_CAPSULE | Freq: Two times a day (BID) | ORAL | 0 refills | Status: DC | PRN

## 2019-09-16 NOTE — Progress Notes (Signed)
Virtual Visit via Video Note  I connected with Robin [name]   on 09/16/19 at 11:00 AM EST by a video enabled telemedicine application and verified that I am speaking with the correct [name] using two identifiers.  Location patient: work Environmental manager or home office Persons participating in the virtual visit: patient, provider  I discussed the limitations of evaluation and management by telemedicine and the availability of in [place] appointments. The patient expressed understanding and agreed to proceed.   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  Past Medical History:  Diagnosis Date  . Allergy   . Anemia   . Arthritis   . CHF (congestive heart failure) ([place])    a. EF at 30-35% by echo in 08/2018 b. EF at 35% by repeat echo in 05/2019  . Chronic abdominal pain   . COPD (chronic obstructive pulmonary disease) ([place])   . Drug use   . Essential hypertension, benign   . GERD (gastroesophageal reflux disease)   . Gout 2016  . Normal coronary arteries    3/10 - following abnormal Myoview  . Ovarian cyst   . Type 2 diabetes mellitus ([place])     Past Surgical History:  Procedure Laterality Date  . ABDOMINAL HYSTERECTOMY  09/10/2011   Procedure: HYSTERECTOMY ABDOMINAL;  Surgeon: Jonnie Kind, MD;  Location: [facility];  Service: Gynecology;  Laterality: N/A;  Abdominal hysterectomy  . CESAREAN SECTION  [id number], and 1994  . CHOLECYSTECTOMY   1995  . SCAR REVISION  09/10/2011   Procedure: SCAR REVISION;  Surgeon: Jonnie Kind, MD;  Location: [facility];  Service: Gynecology;  Laterality: N/A;  Wide Excision of old Cicatrix  . TUBAL LIGATION  1994    Family History  Problem Relation Age of Onset  . Cirrhosis Mother   . Early death Mother   . Alcohol abuse Mother   . Diabetes type II Father   . Alcohol abuse Father   . Diabetes type II Sister   . Early death Brother   . Alcohol abuse Son   . Anesthesia problems Neg Hx   . Hypotension Neg Hx   . Malignant hyperthermia Neg Hx   . Pseudochol deficiency Neg Hx   . Colon cancer Neg Hx   . Colon polyps Neg Hx   . Esophageal cancer Neg Hx   . Rectal cancer Neg Hx   . Stomach cancer Neg Hx     SOCIAL HX: see hpi   Current Outpatient Medications:  .  acetaminophen (TYLENOL) 325 MG tablet, Take 2 tablets (650 mg total) by mouth every 6 (six) hours as needed for mild pain, fever or headache (or Fever >/= 101)., Disp: 12 tablet, Rfl: 0 .  acetaminophen (TYLENOL) 500 MG tablet, Take 1,000 mg by mouth every 6 (six) hours as needed (for pain.)., Disp: , Rfl:  .  albuterol (PROVENTIL) (2.5 MG/3ML) 0.083% nebulizer solution, Take 3 mLs (2.5 mg total)  by nebulization every 6 (six) hours as needed for wheezing or shortness of breath., Disp: 75 mL, Rfl: 5 .  albuterol (VENTOLIN HFA) 108 (90 Base) MCG/ACT inhaler, Inhale 2 puffs into the lungs every 6 (six) hours as needed for wheezing or shortness of breath., Disp: 18 g, Rfl: 5 .  apixaban (ELIQUIS) 5 MG TABS tablet, Take 1 tablet (5 mg total) by mouth 2 (two) times daily., Disp: 60 tablet, Rfl: 5 .  carvedilol (COREG) 25 MG tablet, Take 1.5 tablets (37.5 mg total) by mouth 2 (two) times daily with a meal., Disp: 90 tablet, Rfl: 5 .  digoxin (LANOXIN) 0.125 MG tablet, Take 1 tablet (0.125 mg total) by mouth daily., Disp: 30 tablet, Rfl: 5 .  Dulaglutide (TRULICITY) A999333 0000000 SOPN, Inject 0.75 mg into the skin once a week., Disp: 4 pen,  Rfl: 3 .  gabapentin (NEURONTIN) 100 MG capsule, Take 1 capsule (100 mg total) by mouth 3 (three) times daily., Disp: 90 capsule, Rfl: 5 .  glucose blood (CONTOUR NEXT TEST) test strip, 2x daily, Disp: 100 each, Rfl: 12 .  hydrALAZINE (APRESOLINE) 50 MG tablet, Take 1 tablet (50 mg total) by mouth 3 (three) times daily., Disp: 90 tablet, Rfl: 5 .  HYDROcodone-acetaminophen (NORCO/VICODIN) 5-325 MG tablet, Take 1 tablet by mouth every 4 (four) hours as needed for severe pain., Disp: 10 tablet, Rfl: 0 .  insulin aspart protamine - aspart (NOVOLOG MIX 70/30 FLEXPEN) (70-30) 100 UNIT/ML FlexPen, Inject 0.3 mLs (30 Units total) into the skin 2 (two) times daily., Disp: 30 mL, Rfl: 6 .  Insulin Pen Needle 32G X 4 MM MISC, 1 Device by Does not apply route as directed., Disp: 100 each, Rfl: 6 .  metFORMIN (GLUCOPHAGE) 1000 MG tablet, Take 1 tablet (1,000 mg total) by mouth daily with breakfast., Disp: 90 tablet, Rfl: 3 .  pantoprazole (PROTONIX) 40 MG tablet, Take 1 tablet (40 mg total) by mouth daily., Disp: 30 tablet, Rfl: 5 .  potassium chloride SA (KLOR-CON) 20 MEQ tablet, Take 1 tablet (20 mEq total) by mouth 2 (two) times daily., Disp: 60 tablet, Rfl: 3 .  torsemide (DEMADEX) 20 MG tablet, Take 1 tablet (20 mg total) by mouth 2 (two) times daily., Disp: 60 tablet, Rfl: 5 .  tizanidine (ZANAFLEX) 2 MG capsule, Take 1 capsule (2 mg total) by mouth 2 (two) times daily as needed for muscle spasms., Disp: 15 capsule, Rfl: 0  EXAM:  VITALS per patient if applicable: no fever  GENERAL: alert, oriented, no acute distress  HEENT: atraumatic, conjunttiva clear, no obvious abnormalities on inspection of external nose and ears  NECK: normal movements of the head and neck  LUNGS: on inspection no signs of respiratory distress, breathing rate appears normal, no obvious gross SOB, gasping or wheezing  CV: no obvious cyanosis  MS: moves all visible extremities without noticeable abnormality, points to area  mid to lower R thoracic back between shoulder blade and spine as area of concern  PSYCH/NEURO: pleasant and cooperative, no obvious depression or anxiety, speech and thought processing grossly intact  ASSESSMENT AND PLAN:  Discussed the following assessment and plan:  Acute right-sided thoracic back pain  -we discussed possible serious and likely etiologies, options for evaluation and workup, limitations of telemedicine visit vs in [place] visit, treatment, treatment risks and precautions. Pt prefers to treat via telemedicine empirically rather then risking or undertaking an in [place] visit at this moment. She is at work. Discussed while this could be muscle spasm  there are other causes of thoracic back pain, discussed, some serious - limited in ability to evaluate these virtually. She declines inperson exam now. She prefers to try heat, muscle relaxer (sent rx for tizanidine 2mg  bid prn), tylenol and topical menthol, but she agrees to seek prompt in [place] care if worsening, new symptoms arise, or if is not improving with treatment over the next 24 hours.   I discussed the assessment and treatment plan with the patient. The patient was provided an opportunity to ask questions and all were answered. The patient agreed with the plan and demonstrated an understanding of the instructions.   The patient was advised to call back or seek an in[place] evaluation if the symptoms worsen or if the condition fails to improve as anticipated.   Lucretia Kern, DO

## 2019-09-30 ENCOUNTER — Encounter: Payer: 59 | Attending: Internal Medicine | Admitting: Dietician

## 2019-09-30 ENCOUNTER — Encounter: Payer: Self-pay | Admitting: Dietician

## 2019-09-30 DIAGNOSIS — E1121 Type 2 diabetes mellitus with diabetic nephropathy: Secondary | ICD-10-CM | POA: Insufficient documentation

## 2019-09-30 DIAGNOSIS — Z794 Long term (current) use of insulin: Secondary | ICD-10-CM | POA: Insufficient documentation

## 2019-09-30 DIAGNOSIS — E1122 Type 2 diabetes mellitus with diabetic chronic kidney disease: Secondary | ICD-10-CM

## 2019-09-30 DIAGNOSIS — N1831 Chronic kidney disease, stage 3a: Secondary | ICD-10-CM | POA: Insufficient documentation

## 2019-09-30 NOTE — Patient Instructions (Addendum)
Consider adding 1 cup of frozen berries to your shake. Avoid eating in the middle of the night.  Consider drinking water instead. For a mid morning snack, consider:  Raw vegetables with hummus or fat free sour cream and Mrs. Dash or plain yogurt and Mrs. Dash. Increase your intake of non starchy vegetables Avoid processed meat to help decrease your sodium intake.  Continue the 30 minutes on the exercise bike daily. Find ways to decrease your stress. Aim for adequate sleep (7-8 hours per night) Avoid drinking excessive fluid

## 2019-09-30 NOTE — Progress Notes (Signed)
Diabetes Self-Management Education  Visit Type: First/Initial  Appt. Start Time: 0915 Appt. End Time: 1020 Patient did not log in for video visit and preferred phone visit.  This visit was completed via telephone due to the COVID-19 pandemic.   I spoke with Robin [name] and verified that I was speaking with the correct [place] with two patient identifiers (full name and date of birth).   I discussed the limitations related to this kind of visit and the patient is willing to proceed. Patient was in her office and I was in my office.  10/01/2019  Robin. Robin [name], identified by name and date of birth, is a 54 y.o. female with a diagnosis of Diabetes: Type 2.   ASSESSMENT Patient states that she is following a Herbal Life diet and drinking their protein shake many days as well as drinking the Heral Life tea with Aloe Vera, [place], and 1/2 of an energy tablet.  History includes Type 2 Diabetes since about 1996, HTN, nuropathy, GERD, and CHF (hosptialized October 2020). Medications include Trulicity, Novolog 90/30 20 units q am and 30 units q HS, Metformin, KCL Labs noted to include A1C 10.3% 09/13/19 decreased from 11.4% 06/28/2019, eGFR 52 06/28/2019  Weight hx:  211 lbs today 223 lbs October 2020 decreased to 217 lbs dry weight at that time.  Patient moved in with her boyfriend as she is working 2 jobs and is not home frequently.  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 Sleeps 6 hours per night and is awake at times when her client wakes her and she has a habit of eating during this time.  She also snacks daily at the group home as she is the [place] responsible to prepare the client's meals.  She states that she does not go by the group home menu as she does not like to prepare a lot of processed food and instead will buy groceries with her budget to prepare. Her sister is supportive and calls her to help her keep her diabetes  on track.  Her sister also has diabetes.  Her boyfriend is also supportive.  Advised her to avoid carbohydrate snacks and instead choose low carb snacks if she is hungry.  Encouraged balanced meals with carbohydrates and lean protein.  Discussed importance of adequate sleep and exercise. Advised her to follow a low sodium diet. If she does drink the shake as a meal replacement, consider adding frozen berries to increase the nutrition profile. Increase non starchy vegetables.   Height '5\' 7"'$  (1.702 m), weight 211 lb (95.7 kg), last menstrual period 08/21/2011. Body mass index is 33.05 kg/m.  Diabetes Self-Management Education - 09/30/19 0929      Visit Information   Visit Type  First/Initial      Initial Visit   Diabetes Type  Type 2    Are you currently following a meal plan?  Yes    What type of meal plan do you follow?  Herbal Life diet and Drink to [employer] ("supposed to help the appetite")    Are you taking your medications as prescribed?  Yes    Date Diagnosed  [place]   How would you rate your overall health?  Fair      Psychosocial Assessment   Patient Belief/Attitude about Diabetes  Motivated to manage diabetes    Self-care  barriers  None    Self-management support  Doctor's office    Other persons present  Patient    Patient Concerns  Nutrition/Meal planning;Weight Control;Glycemic Control    Special Needs  None    Preferred Learning Style  No preference indicated    Learning Readiness  Ready    How often do you need to have someone help you when you read instructions, pamphlets, or other written materials from your doctor or pharmacy?  1 - Never    What is the last grade level you completed in school?  11th grade      Pre-Education Assessment   Patient understands the diabetes disease and treatment process.  Needs Instruction    Patient understands incorporating nutritional management into lifestyle.  Needs Instruction    Patient undertands  incorporating physical activity into lifestyle.  Needs Instruction    Patient understands using medications safely.  Needs Instruction    Patient understands monitoring blood glucose, interpreting and using results  Needs Instruction    Patient understands prevention, detection, and treatment of acute complications.  Needs Instruction    Patient understands prevention, detection, and treatment of chronic complications.  Needs Instruction    Patient understands how to develop strategies to address psychosocial issues.  Needs Instruction    Patient understands how to develop strategies to promote health/change behavior.  Needs Instruction      Complications   Last HgB A1C per patient/outside source  10.3 %   09/13/19 decreased from 11.4% 11/2302020   How often do you check your blood sugar?  1-2 times/day    Fasting Blood glucose range (mg/dL)  >200   186-281   Number of hypoglycemic episodes per month  0    Have you had a dilated eye exam in the past 12 months?  No    Have you had a dental exam in the past 12 months?  Yes    Are you checking your feet?  No      Dietary Intake   Breakfast  Toast, sausage and eggs, small amount of jelly occasionally    Snack (morning)  fresh fruit (fruit salad)    Lunch  Herbal Life Shake (2 scoops protein powder, 2 scoops healthy meal mix, 8 oz water)- 200 calories, 18 g CHO OR salad with crab    Snack (afternoon)  none    Dinner  cabbage, can of vienna sausage, NABS, SF jello OR cornbread, boiled chicken, pinto beans OR baked pork chop, squash and green beans    Snack (evening)  none and occasional an orange in the middle of the night    Beverage(s)  3 cups "drink to shrink", water, Herbal life tea with sea moss and aloe, occasional herbal life shake      Exercise   Exercise Type  Light (walking / raking leaves)   exercise bike   How many days per week to you exercise?  7    How many minutes per day do you exercise?  30    Total minutes per week of  exercise  210      Patient Education   Previous Diabetes Education  No    Disease state   Definition of diabetes, type 1 and 2, and the diagnosis of diabetes    Nutrition management   Role of diet in the treatment of diabetes and the relationship between the three main macronutrients and blood glucose level;Meal options for control of blood glucose level and chronic complications.  Physical activity and exercise   Role of exercise on diabetes management, blood pressure control and cardiac health.    Medications  Reviewed patients medication for diabetes, action, purpose, timing of dose and side effects.    Monitoring  Taught/discussed recording of test results and interpretation of SMBG.;Identified appropriate SMBG and/or A1C goals.    Acute complications  Taught treatment of hypoglycemia - the 15 rule.    Chronic complications  Relationship between chronic complications and blood glucose control    Psychosocial adjustment  Worked with patient to identify barriers to care and solutions;Role of stress on diabetes    Personal strategies to promote health  Lifestyle issues that need to be addressed for better diabetes care      Individualized Goals (developed by patient)   Nutrition  General guidelines for healthy choices and portions discussed    Physical Activity  Exercise 3-5 times per week;30 minutes per day    Medications  take my medication as prescribed    Monitoring   test my blood glucose as discussed    Reducing Risk  do foot checks daily;examine blood glucose patterns;increase portions of healthy fats    Health Coping  discuss diabetes with (comment)      Post-Education Assessment   Patient understands the diabetes disease and treatment process.  Demonstrates understanding / competency    Patient understands incorporating nutritional management into lifestyle.  Needs Review    Patient undertands incorporating physical activity into lifestyle.  Needs Review    Patient understands  using medications safely.  Demonstrates understanding / competency    Patient understands monitoring blood glucose, interpreting and using results  Demonstrates understanding / competency    Patient understands prevention, detection, and treatment of acute complications.  Demonstrates understanding / competency    Patient understands prevention, detection, and treatment of chronic complications.  Demonstrates understanding / competency    Patient understands how to develop strategies to address psychosocial issues.  Demonstrates understanding / competency    Patient understands how to develop strategies to promote health/change behavior.  Needs Review      Outcomes   Expected Outcomes  Demonstrated interest in learning. Expect positive outcomes    Future DMSE  2 months    Program Status  Not Completed       Individualized Plan for Diabetes Self-Management Training:   Learning Objective:  Patient will have a greater understanding of diabetes self-management. Patient education plan is to attend individual and/or group sessions per assessed needs and concerns.   Plan:   Patient Instructions  Consider adding 1 cup of frozen berries to your shake. Avoid eating in the middle of the night.  Consider drinking water instead. For a mid morning snack, consider:  Raw vegetables with hummus or fat free sour cream and Mrs. Dash or plain yogurt and Mrs. Dash. Increase your intake of non starchy vegetables Avoid processed meat to help decrease your sodium intake.  Continue the 30 minutes on the exercise bike daily. Find ways to decrease your stress. Aim for adequate sleep (7-8 hours per night) Avoid drinking excessive fluid    Expected Outcomes:  Demonstrated interest in learning. Expect positive outcomes  Education material provided: ADA - How to Thrive: A Guide for Your Journey with Diabetes and Meal plan card, Heart-Healthy Consistent Carbohydrate Nutrition Therapy from AND  If problems or  questions, patient to contact team via:  Phone  Future DSME appointment: 2 months .

## 2019-10-13 ENCOUNTER — Encounter: Payer: Self-pay | Admitting: *Deleted

## 2019-10-13 ENCOUNTER — Telehealth: Payer: Self-pay | Admitting: Internal Medicine

## 2019-10-13 NOTE — Telephone Encounter (Signed)
I have attemped to call patient to get her rescheduled to see Dr Hilarie Fredrickson in the office. Unable to reach patient but sent a mychart message. See above.Marland KitchenMarland KitchenMarland Kitchen

## 2019-10-13 NOTE — Telephone Encounter (Signed)
I have spoken to patient to schedule appointment with Dr Hilarie Fredrickson on 11/26/19 to again discuss endoscopy/colonoscopy for abdominal pain as was previously discussed in 2020 prior to COVID shut down.

## 2019-10-31 ENCOUNTER — Emergency Department (HOSPITAL_COMMUNITY): Payer: 59

## 2019-10-31 ENCOUNTER — Other Ambulatory Visit: Payer: Self-pay

## 2019-10-31 ENCOUNTER — Encounter (HOSPITAL_COMMUNITY): Payer: Self-pay | Admitting: Emergency Medicine

## 2019-10-31 ENCOUNTER — Emergency Department (HOSPITAL_COMMUNITY)
Admission: EM | Admit: 2019-10-31 | Discharge: 2019-10-31 | Disposition: A | Discharge status: Home / Self Care | Payer: 59 | Attending: Emergency Medicine | Admitting: Emergency Medicine

## 2019-10-31 DIAGNOSIS — Z7901 Long term (current) use of anticoagulants: Secondary | ICD-10-CM | POA: Diagnosis not present

## 2019-10-31 DIAGNOSIS — R040 Epistaxis: Secondary | ICD-10-CM

## 2019-10-31 DIAGNOSIS — Z79899 Other long term (current) drug therapy: Secondary | ICD-10-CM | POA: Insufficient documentation

## 2019-10-31 DIAGNOSIS — J449 Chronic obstructive pulmonary disease, unspecified: Secondary | ICD-10-CM | POA: Diagnosis not present

## 2019-10-31 DIAGNOSIS — I110 Hypertensive heart disease with heart failure: Secondary | ICD-10-CM | POA: Diagnosis not present

## 2019-10-31 DIAGNOSIS — Z87891 Personal history of nicotine dependence: Secondary | ICD-10-CM | POA: Insufficient documentation

## 2019-10-31 DIAGNOSIS — R0789 Other chest pain: Secondary | ICD-10-CM | POA: Diagnosis not present

## 2019-10-31 DIAGNOSIS — I5022 Chronic systolic (congestive) heart failure: Secondary | ICD-10-CM | POA: Diagnosis not present

## 2019-10-31 LAB — BASIC METABOLIC PANEL
Anion gap: 10 (ref 5–15)
BUN: 21 mg/dL — ABNORMAL HIGH (ref 6–20)
CO2: 26 mmol/L (ref 22–32)
Calcium: 9.4 mg/dL (ref 8.9–10.3)
Chloride: 101 mmol/L (ref 98–111)
Creatinine, Ser: 1.19 mg/dL — ABNORMAL HIGH (ref 0.44–1.00)
GFR calc Af Amer: >60 mL/min (ref >60)
GFR calc non Af Amer: 52 mL/min — ABNORMAL LOW (ref >60)
Glucose, Bld: 338 mg/dL — ABNORMAL HIGH (ref 70–99)
Potassium: 4.2 mmol/L (ref 3.5–5.1)
Sodium: 137 mmol/L (ref 135–145)

## 2019-10-31 LAB — CBC WITH DIFFERENTIAL/PLATELET
Abs Immature Granulocytes: 0.01 10*3/uL (ref 0.00–0.07)
Basophils Absolute: 0 10*3/uL (ref 0.0–0.1)
Basophils Relative: 1 %
Eosinophils Absolute: 0.2 10*3/uL (ref 0.0–0.5)
Eosinophils Relative: 3 %
HCT: 40.9 % (ref 36.0–46.0)
Hemoglobin: 13.1 g/dL (ref 12.0–15.0)
Immature Granulocytes: 0 %
Lymphocytes Relative: 43 %
Lymphs Abs: 3.2 10*3/uL (ref 0.7–4.0)
MCH: 29.2 pg (ref 26.0–34.0)
MCHC: 32 g/dL (ref 30.0–36.0)
MCV: 91.3 fL (ref 80.0–100.0)
Monocytes Absolute: 0.7 10*3/uL (ref 0.1–1.0)
Monocytes Relative: 9 %
Neutro Abs: 3.4 10*3/uL (ref 1.7–7.7)
Neutrophils Relative %: 44 %
Platelets: 252 10*3/uL (ref 150–400)
RBC: 4.48 MIL/uL (ref 3.87–5.11)
RDW: 12.3 % (ref 11.5–15.5)
WBC: 7.6 10*3/uL (ref 4.0–10.5)
nRBC: 0 % (ref 0.0–0.2)

## 2019-10-31 LAB — BRAIN NATRIURETIC PEPTIDE: B Natriuretic Peptide: 237 pg/mL — ABNORMAL HIGH (ref 0.0–100.0)

## 2019-10-31 LAB — TROPONIN I (HIGH SENSITIVITY): Troponin I (High Sensitivity): 15 ng/L (ref <18)

## 2019-10-31 IMAGING — CT CT ANGIO CHEST
2 of 6 series · 16 of 36 positions shown · IV contrast (Omnipaque or Isovue)
Comparison: [DATE]

CLINICAL DATA: Chest pain. History of thoracic aortic aneurysm.

EXAM:
CT ANGIOGRAPHY CHEST WITH CONTRAST
TECHNIQUE: Multidetector CT imaging of the chest was performed using the
standard protocol during bolus administration of intravenous
contrast. Multiplanar CT image reconstructions and MIPs were
obtained to evaluate the vascular anatomy.
CONTRAST:  100mL OMNIPAQUE IOHEXOL 350 MG/ML SOLN

[Series 7: lungs · axial · 0.73mm/px · z∈[+1066,+1372]mm · 15 of 175 slices shown]
[im 11/175  lung]
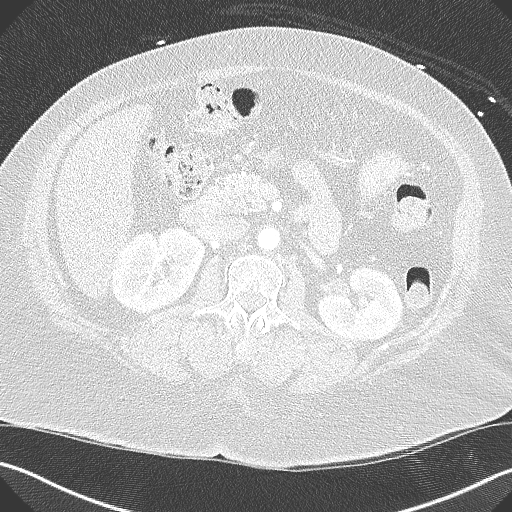
[im 22/175  mediastinal]
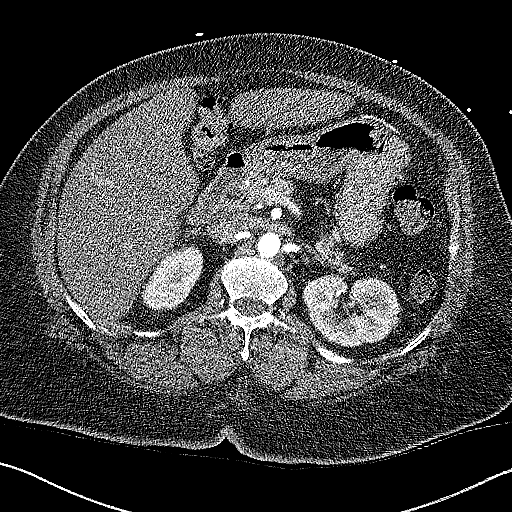
[im 33/175  lung]
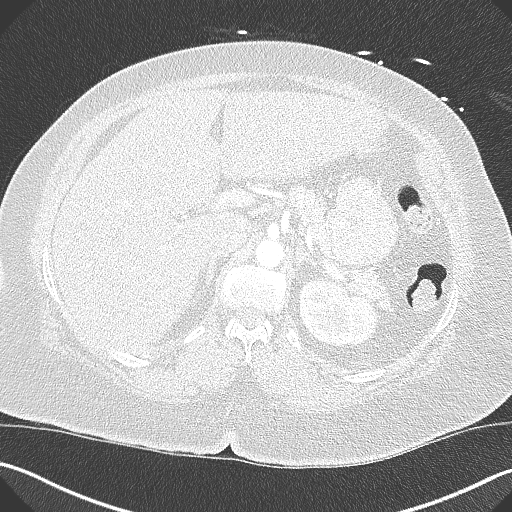
[im 44/175  mediastinal]
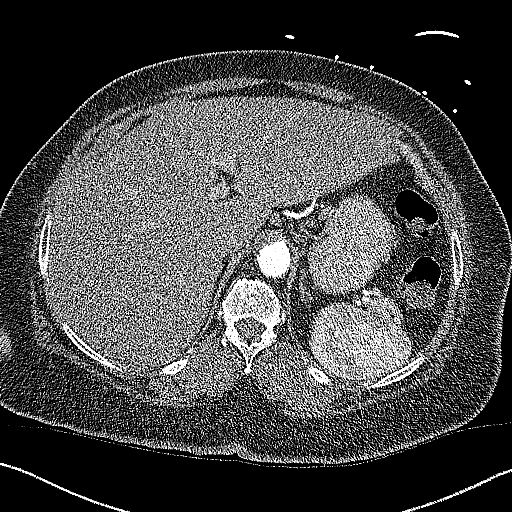
[im 55/175  lung]
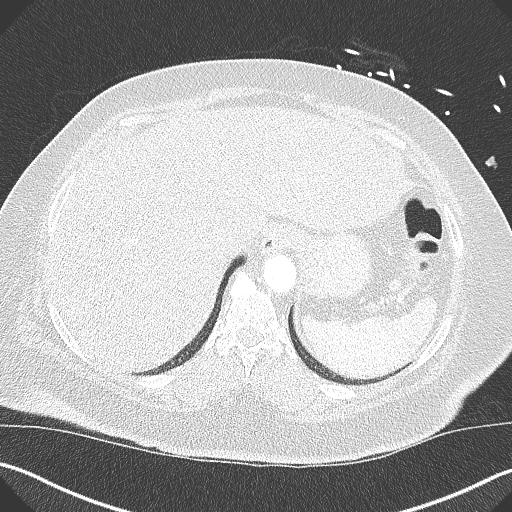
[im 66/175  mediastinal]
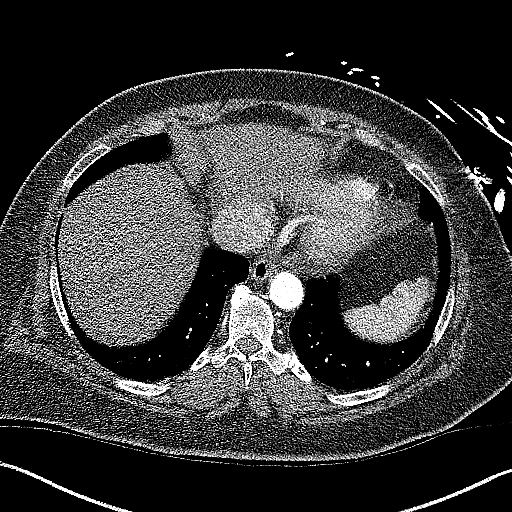
[im 77/175  lung]
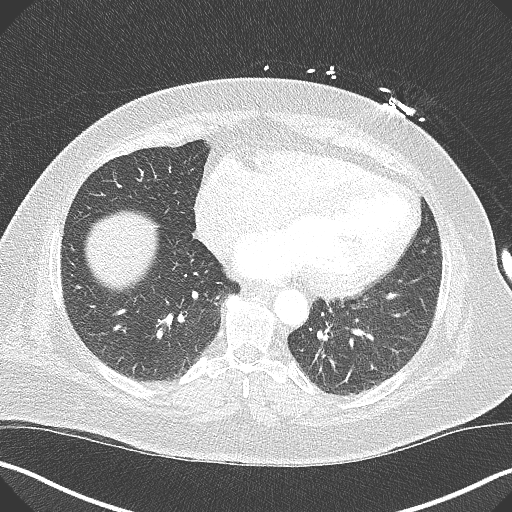
[im 88/175  mediastinal]
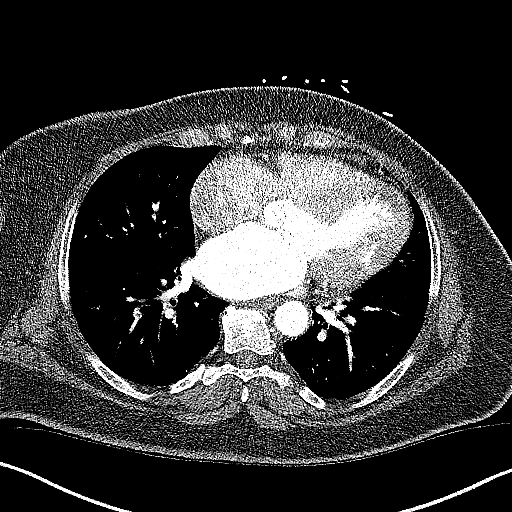
[im 98/175  lung]
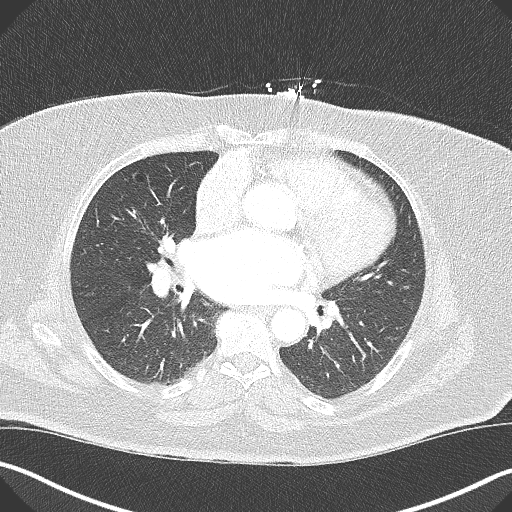
[im 109/175  mediastinal]
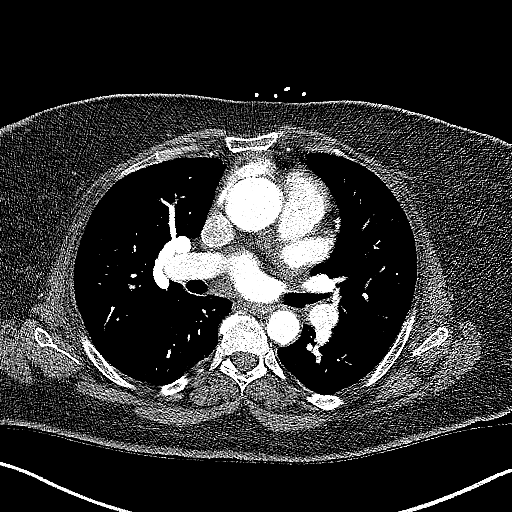
[im 120/175  lung]
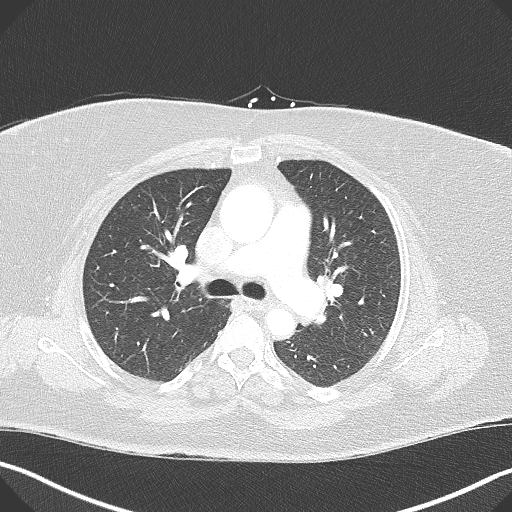
[im 131/175  mediastinal]
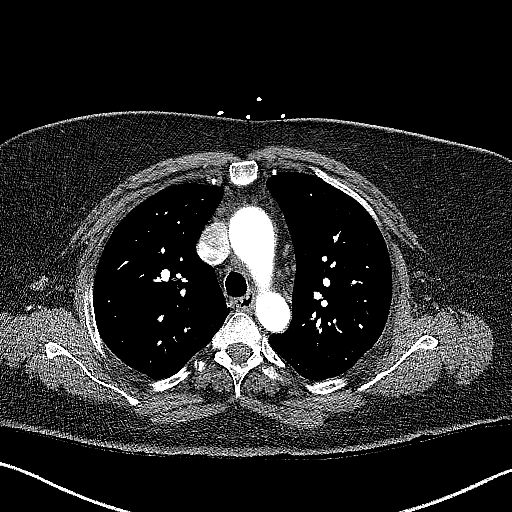
[im 142/175  lung]
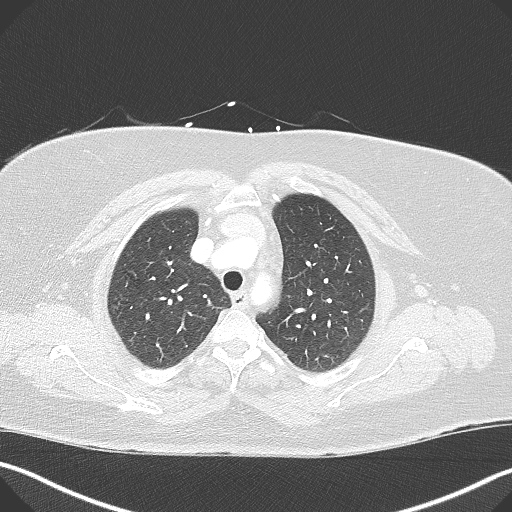
[im 153/175  mediastinal]
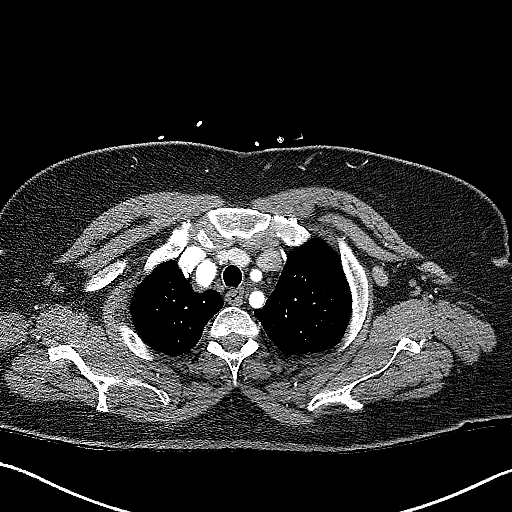
[im 164/175  lung]
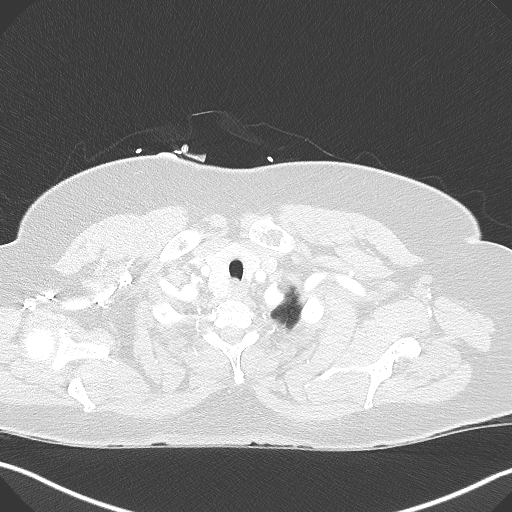

[Series 8: cor soft · coronal · 0.75mm/px · 1 of 163 slices shown]
[im 82/163  mediastinal]
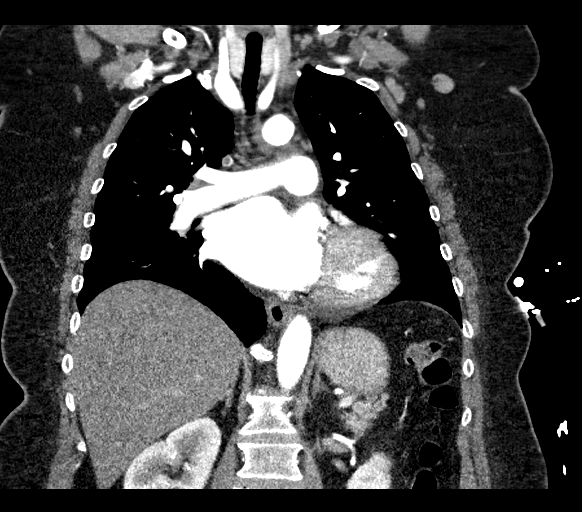

[16 of 36 positions shown; findings below may reference images not displayed]

FINDINGS: Cardiovascular: Ascending aorta measures 4 cm unchanged compared to
prior CT. There is no aortic dissection. The heart size is enlarged.
Minimal pericardial effusion is identified. There is no evidence of
pulmonary embolus.

Mediastinum/Nodes: No enlarged mediastinal, hilar, or axillary lymph
nodes. Thyroid gland, trachea, and esophagus demonstrate no
significant findings.

Lungs/Pleura: Lungs are clear. No pleural effusion or pneumothorax.

Upper Abdomen: Diffuse low density is identified in the liver
without focal liver lesion. Small hiatal hernia is identified.

Musculoskeletal: Degenerative joint changes of the spine are noted.

Review of the MIP images confirms the above findings.
IMPRESSION: 1. Stable ascending aortic aneurysm measuring 4 cm. No aortic
dissection.
2. No pulmonary embolus.
3. Fatty infiltration of liver.

## 2019-10-31 IMAGING — DX DG CHEST 1V PORT
1 series · 1 of 1 positions shown · non-contrast
Comparison: [DATE]

CLINICAL DATA: Chest pain

EXAM:
PORTABLE CHEST 1 VIEW

[chest ap]
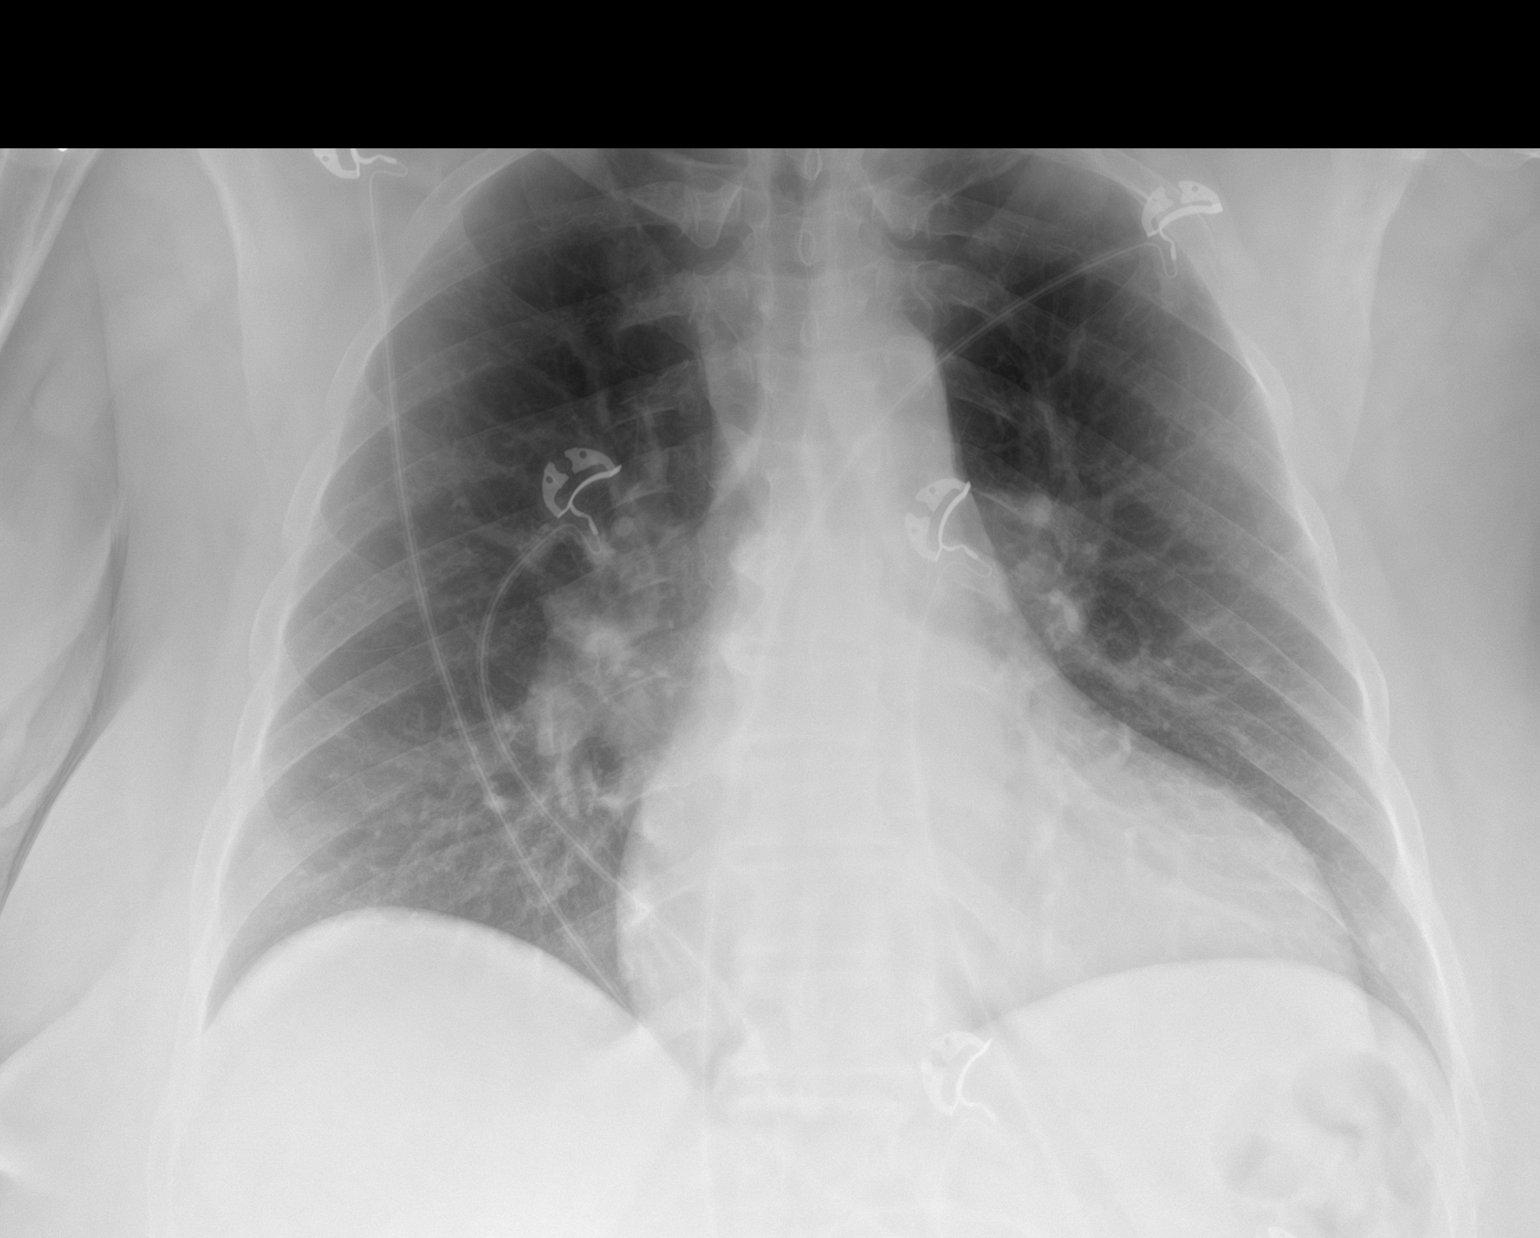

[1 of 1 positions shown; findings below may reference images not displayed]

FINDINGS: There is no focal consolidation. There is no pleural effusion or
pneumothorax. The heart mediastinum are stable.

There is no acute osseous abnormality.
IMPRESSION: No active disease.

## 2019-10-31 MED ORDER — IOHEXOL 350 MG/ML SOLN
100.0000 mL | Freq: Once | INTRAVENOUS | Status: AC | PRN
  Administered 2019-10-31: 100 mL via INTRAVENOUS

## 2019-10-31 MED ORDER — ALUM & MAG HYDROXIDE-SIMETH 200-200-20 MG/5ML PO SUSP
30.0000 mL | Freq: Once | ORAL | Status: AC
  Administered 2019-10-31: 30 mL via ORAL
  Filled 2019-10-31: qty 30

## 2019-10-31 MED ORDER — OXYMETAZOLINE HCL 0.05 % NA SOLN
1.0000 | Freq: Once | NASAL | Status: DC
  Filled 2019-10-31: qty 30

## 2019-10-31 MED ORDER — LIDOCAINE VISCOUS HCL 2 % MT SOLN
15.0000 mL | Freq: Once | OROMUCOSAL | Status: AC
  Administered 2019-10-31: 15 mL via ORAL
  Filled 2019-10-31: qty 15

## 2019-10-31 NOTE — ED Provider Notes (Signed)
Patient has negative work-up, results communicated to the patient, stable for discharge   Noemi Chapel, MD 10/31/19 1002

## 2019-10-31 NOTE — Discharge Instructions (Addendum)
Stop using cocaine.  Use the Afrin as needed.  Return to the ED with new or worsening symptoms. Your testing was reassuring - no source of your pain was found

## 2019-10-31 NOTE — ED Triage Notes (Signed)
Pt c/o a nose bleed that started at 0515. Pt states this is the third time in 24 hours.
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
## 2019-11-01 ENCOUNTER — Telehealth: Payer: Self-pay | Admitting: Physician Assistant

## 2019-11-01 DIAGNOSIS — R040 Epistaxis: Secondary | ICD-10-CM

## 2019-11-01 NOTE — Telephone Encounter (Signed)
Patient stated she went to the ED for Nose Bleed. Patient was advised to call and get a referral for ENT.

## 2019-11-01 NOTE — Telephone Encounter (Signed)
Spoke to pt told her referral has been placed for ENT and someone will be contacting her to schedule an appointment. Pt verbalized understanding.

## 2019-11-01 NOTE — Telephone Encounter (Signed)
Ok for referral?

## 2019-11-01 NOTE — Telephone Encounter (Signed)
Okay to put referral in for ENT?

## 2019-11-18 ENCOUNTER — Encounter: Payer: Self-pay | Admitting: *Deleted

## 2019-11-23 NOTE — Progress Notes (Addendum)
Virtual Visit via Telephone Note   This visit type was conducted due to national recommendations for restrictions regarding the COVID-19 Pandemic (e.g. social distancing) in an effort to limit this patient's exposure and mitigate transmission in our community.  Due to her co-morbid illnesses, this patient is at least at moderate risk for complications without adequate follow up.  This format is felt to be most appropriate for this patient at this time.  The patient did not have access to video technology/had technical difficulties with video requiring transitioning to audio format only (telephone).  All issues noted in this document were discussed and addressed.  No physical exam could be performed with this format.  Please refer to the patient's chart for her  consent to telehealth for [facility].   The patient was identified using 2 identifiers.  Date:  11/23/2019   ID:  Robin [name], DOB Jun 08, 1966, MRN [id number]  Patient Location: Home Provider Location: Office  PCP:  Inda Coke, [place]  Cardiologist:  Carlyle Dolly, MD  Electrophysiologist:  None   Evaluation Performed:  Follow-Up Visit  Chief Complaint: Palpitations  History of Present Illness:    Robin [name] Venus is a 54 y.o. female with past medical history of chronic systolic CHF (EF at 30 to 35% by echo in 08/2018 with similar results by echo in 05/2019), persistent atrial fibrillation (diagnosed in 04/2019), HTN, IDDM, COPD, thoracic aortic aneurysm, normal coronaries by catheterization in 2010, and history of substance abuse who presents to the office today for 6-week follow-up.   She most recently had a telehealth visit with myself in 05/2019 following a recent hospitalization for a CHF exacerbation. She was found to be in atrial fibrillation with RVR during admission and was started on anticoagulation with plans for a DCCV in 3 weeks if still in atrial fibrillation. She was overall doing well at the time of her  visit and reported breathing has been back to baseline. She denied any chest pain or palpitations. She had been intolerant to ACE-I and ARB in the past, therefore she was continued on Coreg, Digoxin and Hydralazine along with Torsemide 20 mg twice daily.Options were reviewed with the patient and a DCCV was recommended. She presented to [facility]2019 for the procedure and a UDS was checked in the preoperative setting and was positive for cocaine. Given this along with her significant hyperglycemia, the procedure was canceled given her low likelihood of maintaining normal rhythm.   In talking with the patient today, she reports still having frequent palpitations on a daily basis and feels like her heart is racing. She has not checked her heart rate when this occurs. Breathing has overall been stable and she denies any recent dyspnea on exertion, orthopnea, PND or lower extremity edema.  She reports good compliance with Eliquis and denies missing any recent doses.  She was previously experiencing intermittent epistaxis last month but denies any recurrent symptoms. No recent orthopnea, PND or lower extremity edema. She denies any cocaine use since 06/2019.  The patient does not have symptoms concerning for COVID-19 infection (fever, chills, cough, or new shortness of breath).    Past Medical History:  Diagnosis Date  . Allergy   . Anemia   . Arthritis   . CHF (congestive heart failure) ([place])    a. EF at 30-35% by echo in 08/2018 b. EF at 35% by repeat echo in 05/2019  . Chronic abdominal pain   . Cocaine abuse ([place])   . COPD (chronic  obstructive pulmonary disease) ([place])   . Essential hypertension, benign   . Fatty liver   . GERD (gastroesophageal reflux disease)   . Gout 2016  . Normal coronary arteries    3/10 - following abnormal Myoview  . Ovarian cyst   . Type 2 diabetes mellitus ([place])    Past Surgical History:  Procedure Laterality Date  . ABDOMINAL HYSTERECTOMY  09/10/2011    Procedure: HYSTERECTOMY ABDOMINAL;  Surgeon: Jonnie Kind, MD;  Location: [facility];  Service: Gynecology;  Laterality: N/A;  Abdominal hysterectomy  . CESAREAN SECTION  [id number], and 1994  . CHOLECYSTECTOMY  1995  . SCAR REVISION  09/10/2011   Procedure: SCAR REVISION;  Surgeon: Jonnie Kind, MD;  Location: [facility];  Service: Gynecology;  Laterality: N/A;  Wide Excision of old Cicatrix  . TUBAL LIGATION  1994     No outpatient medications have been marked as taking for the 11/24/19 encounter (Appointment) with [name], PA-C.     Allergies:   Bee venom, Losartan, Naproxen, Penicillins, and Lisinopril   Social History   Tobacco Use  . Smoking status: Former Smoker    Packs/day: 0.25    Years: 29.00    Pack years: 7.25    Types: Cigarettes  . Smokeless tobacco: Never Used  . Tobacco comment: 1 cigarette every 3 days  Substance Use Topics  . Alcohol use: Yes    Comment: occ  . Drug use: Not Currently    Types: Cocaine, Marijuana     Family Hx: The patient's family history includes Alcohol abuse in her father, mother, and son; Cirrhosis in her mother; Diabetes type II in her father and sister; Early death in her brother and mother. There is no history of Anesthesia problems, Hypotension, Malignant hyperthermia, Pseudochol deficiency, Colon cancer, Colon polyps, Esophageal cancer, Rectal cancer, or Stomach cancer.  ROS:   Please see the history of present illness.     All other systems reviewed and are negative.   Prior CV studies:   The following studies were reviewed today:  Echocardiogram: 05/2019 IMPRESSIONS    1. Left ventricular ejection fraction, by visual estimation, is 35%. The  left ventricle has moderate to severely decreased function. Normal left  ventricular size. There is moderately increased left ventricular  hypertrophy.  2. Elevated left ventricular end-diastolic pressure.  3. Left ventricular diastolic function could not be evaluated  pattern of  LV diastolic filling.  4. LV diffusely hypokinetic.  5. Global right ventricle has moderately reduced systolic function.The  right ventricular size is normal. Mildly increased right ventricular wall  thickness.  6. Left atrial size was mildly dilated.  7. Right atrial size was mildly dilated.  8. Moderate aortic valve annular calcification.  9. The mitral valve is grossly normal. Mild mitral valve regurgitation.  10. The tricuspid valve is grossly normal. Tricuspid valve regurgitation  mild-moderate.  11. The aortic valve is tricuspid Aortic valve regurgitation was not  visualized by color flow Doppler. Mild aortic valve stenosis.  12. The pulmonic valve was not well visualized. Pulmonic valve  regurgitation is not visualized by color flow Doppler.  13. Normal pulmonary artery systolic pressure.  14. The inferior vena cava is dilated in size with >50% respiratory  variability, suggesting right atrial pressure of 8 mmHg.   Labs/Other Tests and Data Reviewed:    EKG:  An ECG dated 10/31/2019 was personally reviewed today and demonstrated:  atrial fibrillation, HR 85 with no acute ST changes.   Recent Labs:  05/10/2019: ALT 18; TSH 1.939 05/11/2019: Magnesium 2.1 10/31/2019: B Natriuretic Peptide 237.0; BUN 21; Creatinine, Ser 1.19; Hemoglobin 13.1; Platelets 252; Potassium 4.2; Sodium 137   Recent Lipid Panel Lab Results  Component Value Date/Time   CHOL 112 06/09/2019 08:18 AM   TRIG 131.0 06/09/2019 08:18 AM   HDL 29.90 (L) 06/09/2019 08:18 AM   CHOLHDL 4 06/09/2019 08:18 AM   LDLCALC 55 06/09/2019 08:18 AM    Wt Readings from Last 3 Encounters:  10/31/19 215 lb (97.5 kg)  09/30/19 211 lb (95.7 kg)  09/13/19 218 lb (98.9 kg)     Objective:    Vital Signs:  LMP 08/21/2011    General: Pleasant female sounding in NAD. Psych: Normal affect. Neuro: Alert and oriented X 3.  Lungs:  Resp regular and unlabored while talking on the phone.   ASSESSMENT & PLAN:     1. Persistent Atrial Fibrillation - Her arrhythmia was diagnosed in 04/2019 and she was scheduled for cardioversion in 06/2019 but this was canceled by anesthesia given UDS was positive for cocaine. She denies any recurrent use since and is in agreement to have a repeat UDS performed. If this is negative, would review with her primary cardiologist and consider rescheduling DCCV as she does remain symptomatic with palpitations. She is currently on Coreg 37.5 mg twice daily and given her weight, this can be further titrated to 50 mg twice daily. Will plan to further titrate today to see if this helps with her palpitations. - She did experience epistaxis last month but denies any recurrence. Hemoglobin stable at 13.1 by most recent labs. She remains on Eliquis 5mg  BID.  Addendum: Reviewed with Dr. Harl Bowie and he recommended rate-control for now and getting a repeat echo to reassess EF. Recommended seeing how she does on the higher dose of Coreg prior to pursing DCCV. Will ask nursing staff to arrange for a repeat limited echo.   2. Chronic Combined Systolic and Diastolic CHF - she has a known reduced EF of 30 to 35% by most recent echocardiogram but thankfully reports her breathing has been at baseline denies any recent orthopnea, PND or lower extremity edema. - She remains on Torsemide 20 mg BID and does take an extra tablet as needed for edema or worsening dyspnea. Continue Coreg, Digoxin and Hydralazine. Intolerant to ACE-I/ARB in the past, therefore Delene Loll has not been initiated as well. Will titrate Coreg as above. Pending BP trend, would consider Spironolactone in the future.   3. HTN - BP was well controlled at 128/87 when checked earlier today. Continue Hydralazine at current dosing and will plan to titrate Coreg to 50 mg twice daily as outlined above. I have asked her to continue to follow readings at home.   4. Thoracic Aortic Aneurysm - at 4.0 cm in 04/2019.  Would anticipate repeat  imaging later this year.  5. History of Substance Abuse - She did test positive for cocaine by UDS in 06/2019 and DCCV was canceled as outlined above. She denies any recurrent use since. Will recheck UDS.   COVID-19 Education: The signs and symptoms of COVID-19 were discussed with the patient and how to seek care for testing (follow up with PCP or arrange E-visit).  The importance of social distancing was discussed today.  Time:   Today, I have spent 15 minutes with the patient with telehealth technology discussing the above problems.     Medication Adjustments/Labs and Tests Ordered: Current medicines are reviewed at length with the patient today.  Concerns regarding medicines are outlined above.   Tests Ordered: No orders of the defined types were placed in this encounter.   Medication Changes: No orders of the defined types were placed in this encounter.   Follow Up:  In [place] in 2 month(s)  Signed, [name], PA-C  11/23/2019 12:56 PM    [facility]

## 2019-11-24 ENCOUNTER — Encounter: Payer: Self-pay | Admitting: Student

## 2019-11-24 ENCOUNTER — Telehealth (INDEPENDENT_AMBULATORY_CARE_PROVIDER_SITE_OTHER): Payer: 59 | Admitting: Student

## 2019-11-24 VITALS — BP 128/87 | HR 87 | Ht 66.0 in

## 2019-11-24 DIAGNOSIS — I4819 Other persistent atrial fibrillation: Secondary | ICD-10-CM | POA: Diagnosis not present

## 2019-11-24 DIAGNOSIS — I5042 Chronic combined systolic (congestive) and diastolic (congestive) heart failure: Secondary | ICD-10-CM

## 2019-11-24 DIAGNOSIS — Z01818 Encounter for other preprocedural examination: Secondary | ICD-10-CM

## 2019-11-24 DIAGNOSIS — I10 Essential (primary) hypertension: Secondary | ICD-10-CM

## 2019-11-24 DIAGNOSIS — I712 Thoracic aortic aneurysm, without rupture, unspecified: Secondary | ICD-10-CM

## 2019-11-24 DIAGNOSIS — I110 Hypertensive heart disease with heart failure: Secondary | ICD-10-CM | POA: Diagnosis not present

## 2019-11-24 DIAGNOSIS — Z87898 Personal history of other specified conditions: Secondary | ICD-10-CM

## 2019-11-24 MED ORDER — CARVEDILOL 25 MG PO TABS: 50.0000 mg | ORAL_TABLET | Freq: Two times a day (BID) | ORAL | 6 refills | Status: DC

## 2019-11-24 NOTE — Patient Instructions (Signed)
Medication Instructions:  Your physician has recommended you make the following change in your medication:   Increase Coreg to 50 mg Two Times Daily   *If you need a refill on your cardiac medications before your next appointment, please call your pharmacy*   Lab Work: Your physician recommends that you return for lab work in: Today    If you have labs (blood work) drawn today and your tests are completely normal, you will receive your results only by: Marland Kitchen MyChart Message (if you have MyChart) OR . A paper copy in the mail If you have any lab test that is abnormal or we need to change your treatment, we will call you to review the results.   Testing/Procedures: NONE     Follow-Up: At [facility], you and your health needs are our priority.  As part of our continuing mission to provide you with exceptional heart care, we have created designated Provider Care Teams.  These Care Teams include your primary Cardiologist (physician) and Advanced Practice Providers (APPs -  Physician Assistants and Nurse Practitioners) who all work together to provide you with the care you need, when you need it.  We recommend signing up for the patient portal called "MyChart".  Sign up information is provided on this After Visit Summary.  MyChart is used to connect with patients for Virtual Visits (Telemedicine).  Patients are able to view lab/test results, encounter notes, upcoming appointments, etc.  Non-urgent messages can be sent to your provider as well.   To learn more about what you can do with MyChart, go to [email or web address].    Your next appointment:   2 month(s)  The format for your next appointment:   In [place]  Provider:   Carlyle Dolly, MD   Other Instructions Thank you for choosing [facility]!

## 2019-11-25 ENCOUNTER — Encounter: Payer: Self-pay | Admitting: Dietician

## 2019-11-25 ENCOUNTER — Ambulatory Visit: Payer: 59 | Admitting: Internal Medicine

## 2019-11-25 ENCOUNTER — Encounter: Payer: 59 | Admitting: Dietician

## 2019-11-25 ENCOUNTER — Encounter: Payer: 59 | Attending: Internal Medicine | Admitting: Dietician

## 2019-11-25 DIAGNOSIS — Z794 Long term (current) use of insulin: Secondary | ICD-10-CM | POA: Insufficient documentation

## 2019-11-25 DIAGNOSIS — N1831 Chronic kidney disease, stage 3a: Secondary | ICD-10-CM | POA: Insufficient documentation

## 2019-11-25 DIAGNOSIS — E1122 Type 2 diabetes mellitus with diabetic chronic kidney disease: Secondary | ICD-10-CM

## 2019-11-25 DIAGNOSIS — Z6835 Body mass index (BMI) 35.0-35.9, adult: Secondary | ICD-10-CM | POA: Insufficient documentation

## 2019-11-25 DIAGNOSIS — E1121 Type 2 diabetes mellitus with diabetic nephropathy: Secondary | ICD-10-CM | POA: Insufficient documentation

## 2019-11-25 NOTE — Progress Notes (Signed)
Diabetes Self-Management Education  Visit Type:  Follow-up  Appt. Start Time: 0930 Appt. End Time: 1000 This visit was completed via telephone due to the COVID-19 pandemic.   I spoke with Robin [name] and verified that I was speaking with the correct [place] with two patient identifiers (full name and date of birth).   I discussed the limitations related to this kind of visit and the patient is willing to proceed.  11/25/2019  [name], identified by name and date of birth, is a 54 y.o. female with a diagnosis of Diabetes:  .    ASSESSMENT Increased stress.  Older son with amputated leg.  Trying to get him into an alcohol program.  He does not live with her. 215 lbs today Feels that her body composition is changing and clothes fit better. She stopped the night job and works 3 hours in the evening at times. Sleep:  10-1 or 2 and sometimes is unable to go back to sleep. Physical activity:  Exercise bike at her morning job for 60 minutes daily while she watches "My 600 lb life", walks a lot on her job.  History includes Type 2 Diabetes since about 1996, HTN, nuropathy, GERD, and CHF (hosptialized October 2020). Medications include Trulicity, Novolog 82/42 20 units q am and 30 units q HS, Metformin, KCL Labs noted to include A1C 10.3% 09/13/19 decreased from 11.4% 06/28/2019, eGFR 52 06/28/2019  Weight hx:  215 lbs 11/25/2019 211 lbs 09/30/2019 223 lbs October 2020 decreased to 217 lbs dry weight at that time.  Patient lives with her boyfriend.  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  She stopped working the night job.  Weight 215 lb (97.5 kg), last menstrual period 08/21/2011. Body mass index is 34.7 kg/m.   Diabetes Self-Management Education - 11/25/19 1040      Pre-Education Assessment   Patient understands the diabetes disease and treatment process.  Needs Review    Patient understands incorporating nutritional management into  lifestyle.  Needs Review    Patient undertands incorporating physical activity into lifestyle.  Needs Review    Patient understands using medications safely.  Needs Review    Patient understands monitoring blood glucose, interpreting and using results  Needs Review    Patient understands prevention, detection, and treatment of acute complications.  Needs Review    Patient understands prevention, detection, and treatment of chronic complications.  Needs Review    Patient understands how to develop strategies to address psychosocial issues.  Needs Review    Patient understands how to develop strategies to promote health/change behavior.  Needs Review      Dietary Intake   Breakfast  1 waffle, 1 1/2 tsp reg syrup, 2 boiled eggs    Snack (morning)  none    Lunch  shrimp alfredo with vegetable, fruit salad    Snack (afternoon)  none    Dinner  6 oz Rib eye, 1/2 ear corn    Snack (evening)  none    Beverage(s)  water, diet green tea, flavored water      Exercise   Exercise Type  Light (walking / raking leaves)    How many days per week to you exercise?  7    How many minutes per day do you exercise?  60    Total minutes per week of exercise  420      Patient Education   Previous Diabetes Education  Yes (please comment)  09/2019   Nutrition management   Food label reading, portion sizes and measuring food.;Meal options for control of blood glucose level and chronic complications.;Other (comment)   low sodium   Physical activity and exercise   Other (comment)   encouraged continued   Medications  Reviewed patients medication for diabetes, action, purpose, timing of dose and side effects.    Psychosocial adjustment  Worked with patient to identify barriers to care and solutions;Role of stress on diabetes      Individualized Goals (developed by patient)   Nutrition  General guidelines for healthy choices and portions discussed    Physical Activity  Exercise 5-7 days per week;60 minutes per  day    Medications  take my medication as prescribed    Monitoring   test my blood glucose as discussed    Reducing Risk  increase portions of healthy fats;Other (comment)   decrease stress   Health Coping  discuss diabetes with (comment)   MD, RD, CDCES     Patient Self-Evaluation of Goals - Patient rates self as meeting previously set goals (% of time)   Nutrition  50 - 75 %    Physical Activity  >75%    Medications  >75%    Monitoring  >75%    Problem Solving  50 - 75 %    Reducing Risk  >75%    Health Coping  >75%      Post-Education Assessment   Patient understands the diabetes disease and treatment process.  Demonstrates understanding / competency    Patient understands incorporating nutritional management into lifestyle.  Needs Review    Patient undertands incorporating physical activity into lifestyle.  Demonstrates understanding / competency    Patient understands using medications safely.  Demonstrates understanding / competency    Patient understands monitoring blood glucose, interpreting and using results  Demonstrates understanding / competency    Patient understands prevention, detection, and treatment of acute complications.  Demonstrates understanding / competency    Patient understands prevention, detection, and treatment of chronic complications.  Demonstrates understanding / competency    Patient understands how to develop strategies to address psychosocial issues.  Needs Review      Outcomes   Program Status  Not Completed      Subsequent Visit   Since your last visit have you experienced any weight changes?  No change       Learning Objective:  Patient will have a greater understanding of diabetes self-management. Patient education plan is to attend individual and/or group sessions per assessed needs and concerns.   Plan:   Patient Instructions  Consider going to an [employer] meeting (with our without your son).  Google "AA meeting in [place]" (or town of  your choice)  [email or web address] may also be helpful Discuss your poor sleep with your doctor. Add more non starchy vegetables daily, especially green leafy vegetables. Consider avoiding garlic and garlic powder to see if this helps your stomach. Continue to avoid added salt. Read the labels for sodium.  Processed food is high in sodium and should be limited. Meat portion ("deck of cards")  Continue to stay active.  You are doing a great job!     Expected Outcomes:  Demonstrated interest in learning. Expect positive outcomes  Education material provided: No sodium seasonings, My plate  If problems or questions, patient to contact team via:  Phone and Email  Future DSME appointment: - 2 months

## 2019-11-25 NOTE — Patient Instructions (Addendum)
Consider going to an [place] (with our without your son).  Google "AA meeting in [place]" (or town of your choice)  [email or web address] may also be helpful Discuss your poor sleep with your doctor. Add more non starchy vegetables daily, especially green leafy vegetables. Consider avoiding garlic and garlic powder to see if this helps your stomach. Continue to avoid added salt. Read the labels for sodium.  Processed food is high in sodium and should be limited. Meat portion ("deck of cards")  Continue to stay active.  You are doing a great job!

## 2019-11-26 ENCOUNTER — Telehealth: Payer: Self-pay | Admitting: *Deleted

## 2019-11-26 ENCOUNTER — Ambulatory Visit: Payer: 59 | Admitting: Internal Medicine

## 2019-11-26 DIAGNOSIS — I10 Essential (primary) hypertension: Secondary | ICD-10-CM

## 2019-11-26 DIAGNOSIS — I5042 Chronic combined systolic (congestive) and diastolic (congestive) heart failure: Secondary | ICD-10-CM

## 2019-11-26 NOTE — Telephone Encounter (Signed)
Called pt no answer. Unable to leave voice msg. Will try back.

## 2019-11-26 NOTE — Telephone Encounter (Signed)
Pt notified and voiced understanding. Order placed.  °

## 2019-11-26 NOTE — Telephone Encounter (Signed)
-----   Message from Robin [name], [place] sent at 11/25/2019  5:05 PM EDT ----- Regarding: Limited Echo Robin [name],   Will you let this patient know I discussed her situation with Dr. Harl Bowie and he recommended that she try the higher dose of Coreg initially to see how she feels with this prior to pursuing a cardioversion.  He did recommend that we get a limited echocardiogram to reassess EF.  Please see if we can get this arranged within the next several weeks?  Thanks,Brittany ----- Message ----- From: Arnoldo Lenis, MD Sent: 11/25/2019  12:40 PM EDT To: Robin Heritage, PA-C  Looks like she is reasonably rate controlled, I would continue rate control and repeat an echo to see if her LVEF has improved. Would keep DCCV on radar but see how things settle out further over time regarding drug use and how she does on the higher coreg dosing   Zandra Abts MD ----- Message ----- From: Robin Heritage, PA-C Sent: 11/24/2019   5:29 PM EDT To: Arnoldo Lenis, MD  Dr. Harl Bowie,  This patient has a known cardiomyopathy with an EF of 30 to 35% and she was diagnosed with atrial fibrillation last fall. She was scheduled for DCCV following 3 weeks of anticoagulation but unfortunately tested positive for cocaine.  She adamantly denies any recurrent use and is in agreement to having a repeat UDS. Given that she remains symptomatic from an atrial fibrillation perspective, do you think it would be beneficial to try to get her rescheduled for DCCV if UDS is negative?  Thanks,  [place]

## 2019-11-29 ENCOUNTER — Other Ambulatory Visit: Payer: Self-pay

## 2019-11-29 ENCOUNTER — Other Ambulatory Visit: Payer: Self-pay | Admitting: *Deleted

## 2019-11-29 MED ORDER — DIGOXIN 125 MCG PO TABS: 0.1250 mg | ORAL_TABLET | Freq: Every day | ORAL | 5 refills | Status: DC

## 2019-11-29 MED ORDER — PANTOPRAZOLE SODIUM 40 MG PO TBEC: 40.0000 mg | DELAYED_RELEASE_TABLET | Freq: Every day | ORAL | 2 refills | Status: DC

## 2019-11-29 MED ORDER — TORSEMIDE 20 MG PO TABS: 20.0000 mg | ORAL_TABLET | Freq: Two times a day (BID) | ORAL | 5 refills | Status: DC

## 2019-11-29 NOTE — Telephone Encounter (Signed)
Refilled torsemide and digoxin

## 2019-12-01 ENCOUNTER — Telehealth (INDEPENDENT_AMBULATORY_CARE_PROVIDER_SITE_OTHER): Payer: 59 | Admitting: Adult Health

## 2019-12-01 ENCOUNTER — Encounter: Payer: Self-pay | Admitting: Adult Health

## 2019-12-01 VITALS — BP 103/69 | HR 98 | Wt 218.0 lb

## 2019-12-01 DIAGNOSIS — B027 Disseminated zoster: Secondary | ICD-10-CM | POA: Diagnosis not present

## 2019-12-01 MED ORDER — VALACYCLOVIR HCL 1 G PO TABS: 1000.0000 mg | ORAL_TABLET | Freq: Three times a day (TID) | ORAL | 0 refills | Status: AC

## 2019-12-01 NOTE — Progress Notes (Signed)
Virtual Visit via Video Note  I connected with Robin [name]  on 12/01/19 at 10:30 AM EDT by a video enabled telemedicine application and verified that I am speaking with the correct Robin [name] using two identifiers.  Location patient: home Location provider:work or home office [name] participating in the virtual visit: patient, provider  I discussed the limitations of evaluation and management by telemedicine and the availability of in [place] appointments. The patient expressed understanding and agreed to proceed.   HPI: 54 year old female who is being evaluated today for an acute issue.  She reports that about 4 days ago she started to notice a red itching/burning rash in the middle of her back that radiated to the right flank.  She does have a prior history of shingles reports that the symptoms are the same.  As time has progressed she has noticed small bumps within the rash.  She denies drainage or discharge.   ROS: See pertinent positives and negatives per HPI.  Past Medical History:  Diagnosis Date  . Allergy   . Anemia   . Arthritis   . CHF (congestive heart failure) ([place])    a. EF at 30-35% by echo in 08/2018 b. EF at 35% by repeat echo in 05/2019  . Chronic abdominal pain   . Cocaine abuse ([place])   . COPD (chronic obstructive pulmonary disease) ([place])   . Essential hypertension, benign   . Fatty liver   . GERD (gastroesophageal reflux disease)   . Gout 2016  . Normal coronary arteries    3/10 - following abnormal Myoview  . Ovarian cyst   . Type 2 diabetes mellitus ([place])     Past Surgical History:  Procedure Laterality Date  . ABDOMINAL HYSTERECTOMY  09/10/2011   Procedure: HYSTERECTOMY ABDOMINAL;  Surgeon: Jonnie Kind, MD;  Location: [facility];  Service: Gynecology;  Laterality: N/A;  Abdominal hysterectomy  . CESAREAN SECTION  [id number], and 1994  . CHOLECYSTECTOMY  1995  . SCAR REVISION  09/10/2011   Procedure: SCAR REVISION;  Surgeon: Jonnie Kind, MD;  Location: [facility];  Service: Gynecology;  Laterality: N/A;  Wide Excision of old Cicatrix  . TUBAL LIGATION  1994    Family History  Problem Relation Age of Onset  . Cirrhosis Mother   . Early death Mother   . Alcohol abuse Mother   . Diabetes type II Father   . Alcohol abuse Father   . Diabetes type II Sister   . Early death Brother   . Alcohol abuse Son   . Anesthesia problems Neg Hx   . Hypotension Neg Hx   . Malignant hyperthermia Neg Hx   . Pseudochol deficiency Neg Hx   . Colon cancer Neg Hx   . Colon polyps Neg Hx   . Esophageal cancer Neg Hx   . Rectal cancer Neg Hx   . Stomach cancer Neg Hx        Current Outpatient Medications:  .  acetaminophen (TYLENOL) 325 MG tablet, Take 2 tablets (650 mg total) by mouth every 6 (six) hours as needed for mild pain, fever or headache (or Fever >/= 101)., Disp: 12 tablet, Rfl: 0 .  albuterol (PROVENTIL) (2.5 MG/3ML) 0.083% nebulizer solution, Take 3 mLs (2.5 mg total) by nebulization every 6 (six) hours as needed for wheezing or shortness of breath., Disp: 75 mL, Rfl: 5 .  albuterol (VENTOLIN HFA) 108 (90 Base) MCG/ACT inhaler, Inhale 2 puffs into the lungs every 6 (six) hours as needed for  wheezing or shortness of breath., Disp: 18 g, Rfl: 5 .  apixaban (ELIQUIS) 5 MG TABS tablet, Take 1 tablet (5 mg total) by mouth 2 (two) times daily., Disp: 60 tablet, Rfl: 5 .  carvedilol (COREG) 25 MG tablet, Take 2 tablets (50 mg total) by mouth 2 (two) times daily with a meal., Disp: 120 tablet, Rfl: 6 .  digoxin (LANOXIN) 0.125 MG tablet, Take 1 tablet (0.125 mg total) by mouth daily., Disp: 30 tablet, Rfl: 5 .  Dulaglutide (TRULICITY) A999333 0000000 SOPN, Inject 0.75 mg into the skin once a week., Disp: 4 pen, Rfl: 3 .  gabapentin (NEURONTIN) 100 MG capsule, Take 1 capsule (100 mg total) by mouth 3 (three) times daily., Disp: 90 capsule, Rfl: 5 .  glucose blood (CONTOUR NEXT TEST) test strip, 2x daily, Disp: 100 each, Rfl: 12 .  hydrALAZINE (APRESOLINE) 50  MG tablet, Take 1 tablet (50 mg total) by mouth 3 (three) times daily., Disp: 90 tablet, Rfl: 5 .  insulin aspart protamine - aspart (NOVOLOG MIX 70/30 FLEXPEN) (70-30) 100 UNIT/ML FlexPen, Inject 0.3 mLs (30 Units total) into the skin 2 (two) times daily., Disp: 30 mL, Rfl: 6 .  Insulin Pen Needle 32G X 4 MM MISC, 1 Device by Does not apply route as directed., Disp: 100 each, Rfl: 6 .  metFORMIN (GLUCOPHAGE) 1000 MG tablet, Take 1 tablet (1,000 mg total) by mouth daily with breakfast., Disp: 90 tablet, Rfl: 3 .  pantoprazole (PROTONIX) 40 MG tablet, Take 1 tablet (40 mg total) by mouth daily., Disp: 30 tablet, Rfl: 2 .  potassium chloride SA (KLOR-CON) 20 MEQ tablet, Take 1 tablet (20 mEq total) by mouth 2 (two) times daily., Disp: 60 tablet, Rfl: 3 .  tizanidine (ZANAFLEX) 2 MG capsule, Take 1 capsule (2 mg total) by mouth 2 (two) times daily as needed for muscle spasms., Disp: 15 capsule, Rfl: 0 .  torsemide (DEMADEX) 20 MG tablet, Take 1 tablet (20 mg total) by mouth 2 (two) times daily., Disp: 60 tablet, Rfl: 5  EXAM:  VITALS per patient if applicable:  GENERAL: alert, oriented, appears well and in no acute distress  HEENT: atraumatic, conjunttiva clear, no obvious abnormalities on inspection of external nose and ears  NECK: normal movements of the head and neck  LUNGS: on inspection no signs of respiratory distress, breathing rate appears normal, no obvious gross SOB, gasping or wheezing  CV: no obvious cyanosis  MS: moves all visible extremities without noticeable abnormality  PSYCH/NEURO: pleasant and cooperative, no obvious depression or anxiety, speech and thought processing grossly intact  SKIN: Video quality was mediocre at best but she does appear to have a red vesicular rash on her right sided mid back that does not cross midline.  ASSESSMENT AND PLAN:  Discussed the following assessment and plan:  1. Disseminated herpes zoster -Exam is consistent with recurrent  shingles.  She is already taking prescribed gabapentin I will have her continue this to help with pain control and send in Valtrex 1000 mg 3 times daily x10 days.  She was advised to follow-up with her PCP if no improvement over the next 2 to 3 days - valACYclovir (VALTREX) 1000 MG tablet; Take 1 tablet (1,000 mg total) by mouth 3 (three) times daily for 10 days.  Dispense: 30 tablet; Refill: 0     I discussed the assessment and treatment plan with the patient. The patient was provided an opportunity to ask questions and all were answered. The patient agreed with  the plan and demonstrated an understanding of the instructions.   The patient was advised to call back or seek an in-[place] evaluation if the symptoms worsen or if the condition fails to improve as anticipated.   Dorothyann Peng, NP

## 2019-12-03 ENCOUNTER — Other Ambulatory Visit: Payer: Self-pay

## 2019-12-03 ENCOUNTER — Telehealth: Payer: Self-pay

## 2019-12-03 ENCOUNTER — Encounter: Payer: Self-pay | Admitting: Physician Assistant

## 2019-12-03 ENCOUNTER — Ambulatory Visit (HOSPITAL_COMMUNITY): Payer: 59

## 2019-12-03 ENCOUNTER — Telehealth (INDEPENDENT_AMBULATORY_CARE_PROVIDER_SITE_OTHER): Payer: 59 | Admitting: Physician Assistant

## 2019-12-03 DIAGNOSIS — L539 Erythematous condition, unspecified: Secondary | ICD-10-CM | POA: Diagnosis not present

## 2019-12-03 MED ORDER — CEPHALEXIN 500 MG PO CAPS: 500.0000 mg | ORAL_CAPSULE | Freq: Three times a day (TID) | ORAL | 0 refills | Status: DC

## 2019-12-03 MED ORDER — TRIAMCINOLONE ACETONIDE 0.1 % EX CREA: TOPICAL_CREAM | CUTANEOUS | 0 refills | Status: DC

## 2019-12-03 NOTE — Telephone Encounter (Signed)
Patient requesting  some medication for cellulitis. Patient states that her legs will not stop itching. Patient states that she's tried several OTC medicine and nothing seem to works.

## 2019-12-03 NOTE — Telephone Encounter (Signed)
Please call pt and schedule virtual visit with [facility] for 4:00 PM and tell pt we will do her now. We will call her back once you have her on the schedule.
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
## 2019-12-03 NOTE — Telephone Encounter (Signed)
Pt scheduled for 4:00 pm today.

## 2019-12-10 ENCOUNTER — Ambulatory Visit (HOSPITAL_COMMUNITY): Payer: 59

## 2019-12-10 NOTE — Progress Notes (Deleted)
Name: Robin [name]  Age/ Sex: 54 y.o., female   MRN/ DOB: [id number], [birth year]     PCP: Inda Coke, PA   Reason for Endocrinology Evaluation: Type 2 Diabetes Mellitus  Initial Endocrine Consultative Visit: 10/01/2018    PATIENT IDENTIFIER: [name] is a 54 y.o. female with a past medical history of T2DM, CHF, COPD and GERD. The patient has followed with Endocrinology clinic since 10/01/2018 for consultative assistance with management of her diabetes.  DIABETIC HISTORY:  Robin [name] was diagnosed with DM > 20 yrs ago. Has been on Janumet and Glipizide in the past. Has been on insulin for years. Her hemoglobin A1c has ranged from 7.9% in 2017, peaking at 11.3% in [id number]  Stopped Trulicity due to cost in 09/2018  SUBJECTIVE:   During the last visit (09/13/2019): A1c 10.3 % ,we continued metformin and novolog mix and started Trulicity   Today (123456): Robin [name] is here for a follow up on diabetes management.. She checks her blood sugars 2 times daily. The patient has not had hypoglycemic episodes on the meter, but she does endorse symptoms of shaking between breakfast and lunch.   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 She started herba light diet but she has not noticed any weight loss, on the contrary she has been gaining weight.  In detail questioning about this diet patient eats a regular breakfast, then she will snack, she will have a meal replacement for lunch followed by another snack followed by another meal replacement for dinner followed by another snack.  ROS: As per HPI and as detailed below: Review of Systems  Constitutional: Negative for chills and fever.    Respiratory: Negative for cough and shortness of breath.   Cardiovascular: Negative for chest pain and palpitations.  Gastrointestinal: Negative for diarrhea and nausea.      HOME DIABETES REGIMEN:  Metformin 1000 mg twice a day  Novolog mix 30 units BID     METER DOWNLOAD SUMMARY: Unable to download 167-314 mg/dL    DIABETIC COMPLICATIONS: Microvascular complications:   Neuropathy  Denies: CKD, retinopathy   Last eye exam: Completed many years ago    Macrovascular complications:   Denies: CAD, PVD, CVA    HISTORY:  Past Medical History:  Past Medical History:  Diagnosis Date  . Allergy   . Anemia   . Arthritis   . CHF (congestive heart failure) ([place])    a. EF at 30-35% by echo in 08/2018 b. EF at 35% by repeat echo in 05/2019  . Chronic abdominal pain   . Cocaine abuse ([place])   . COPD (chronic obstructive pulmonary disease) ([place])   . Essential hypertension, benign   . Fatty liver   . GERD (gastroesophageal reflux disease)   . Gout 2016  . Normal coronary arteries    3/10 - following abnormal Myoview  . Ovarian cyst   . Type 2 diabetes mellitus ([place])    Past Surgical History:  Past Surgical History:  Procedure Laterality Date  . ABDOMINAL HYSTERECTOMY  09/10/2011   Procedure: HYSTERECTOMY ABDOMINAL;  Surgeon: Jonnie Kind, MD;  Location: [facility];  Service: Gynecology;  Laterality: N/A;  Abdominal hysterectomy  . CESAREAN SECTION  [id number], and 1994  . CHOLECYSTECTOMY  1995  . SCAR REVISION  09/10/2011   Procedure: SCAR REVISION;  Surgeon: Jonnie Kind, MD;  Location: [facility];  Service: Gynecology;  Laterality: N/A;  Wide Excision of old Cicatrix  . TUBAL LIGATION  1994    Social History:  reports that she has quit smoking. Her smoking use included cigarettes. She has a 7.25 pack-year smoking history. She has never used smokeless tobacco. She reports current alcohol use. She reports previous drug use. Drugs: Cocaine and Marijuana. Family History:   Family History  Problem Relation Age of Onset  . Cirrhosis Mother   . Early death Mother   . Alcohol abuse Mother   . Diabetes type II Father   . Alcohol abuse Father   . Diabetes type II Sister   . Early death Brother   . Alcohol abuse Son   . Anesthesia problems Neg Hx   . Hypotension Neg Hx   . Malignant hyperthermia Neg Hx   . Pseudochol deficiency Neg Hx   . Colon cancer Neg Hx   . Colon polyps Neg Hx   . Esophageal cancer Neg Hx   . Rectal cancer Neg Hx   . Stomach cancer Neg Hx      HOME MEDICATIONS: Allergies as of 12/13/2019      Reactions   Bee Venom Shortness Of Breath, Swelling   Bodily Swelling   Losartan Other (See Comments)   Nosebleeds per patient report.    Naproxen Other (See Comments)   Effects acid reflux   Penicillins Nausea Only   Has patient had a PCN reaction causing immediate rash, facial/tongue/throat swelling, SOB or lightheadedness with hypotension: no Has patient had a PCN reaction causing severe rash involving mucus membranes or skin necrosis: no Has patient had a PCN reaction that required hospitalization no Has patient had a PCN reaction occurring within the last 10 years: no If all of the above answers are "NO", then may proceed with Cephalosporin    Lisinopril    Sinus congestion      Medication List       Accurate as of Dec 13, 2019  7:16 AM. If you have any questions, ask your nurse or doctor.        acetaminophen 325 MG tablet Commonly known as: TYLENOL Take 2 tablets (650 mg total) by mouth every 6 (six) hours as needed for mild pain, fever or headache (or Fever >/= 101).   albuterol (2.5 MG/3ML) 0.083% nebulizer solution Commonly known as: PROVENTIL Take 3 mLs (2.5 mg total) by nebulization every 6 (six) hours as needed for wheezing or shortness of breath.   albuterol 108 (90 Base) MCG/ACT inhaler Commonly known as: VENTOLIN HFA Inhale 2 puffs into the lungs every 6 (six) hours as needed for wheezing or shortness of  breath.   apixaban 5 MG Tabs tablet Commonly known as: ELIQUIS Take 1 tablet (5 mg total) by mouth 2 (two) times daily.   carvedilol 25 MG tablet Commonly known as: Coreg Take 2 tablets (50 mg total) by mouth 2 (two) times daily with a meal.   cephALEXin 500 MG capsule Commonly known as: KEFLEX Take 1 capsule (500 mg total) by mouth 3 (three) times daily.   Contour Next Test test strip Generic drug: glucose blood 2x daily   digoxin 0.125 MG tablet Commonly known as: LANOXIN Take 1 tablet (0.125 mg total) by mouth  daily.   gabapentin 100 MG capsule Commonly known as: NEURONTIN Take 1 capsule (100 mg total) by mouth 3 (three) times daily.   hydrALAZINE 50 MG tablet Commonly known as: APRESOLINE Take 1 tablet (50 mg total) by mouth 3 (three) times daily.   Insulin Pen Needle 32G X 4 MM Misc 1 Device by Does not apply route as directed.   metFORMIN 1000 MG tablet Commonly known as: Glucophage Take 1 tablet (1,000 mg total) by mouth daily with breakfast.   NovoLOG Mix 70/30 FlexPen (70-30) 100 UNIT/ML FlexPen Generic drug: insulin aspart protamine - aspart Inject 0.3 mLs (30 Units total) into the skin 2 (two) times daily. What changed:   how much to take  additional instructions   pantoprazole 40 MG tablet Commonly known as: PROTONIX Take 1 tablet (40 mg total) by mouth daily.   potassium chloride SA 20 MEQ tablet Commonly known as: KLOR-CON Take 1 tablet (20 mEq total) by mouth 2 (two) times daily.   tizanidine 2 MG capsule Commonly known as: ZANAFLEX Take 1 capsule (2 mg total) by mouth 2 (two) times daily as needed for muscle spasms.   torsemide 20 MG tablet Commonly known as: DEMADEX Take 1 tablet (20 mg total) by mouth 2 (two) times daily.   triamcinolone cream 0.1 % Commonly known as: KENALOG Apply to affected area one to times daily   Trulicity [id number] [id number] Sopn Generic drug: Dulaglutide Inject 0.75 mg into the skin once a week.         OBJECTIVE:   Vital Signs: LMP 08/21/2011   Wt Readings from Last 3 Encounters:  12/01/19 218 lb (98.9 kg)  11/25/19 215 lb (97.5 kg)  10/31/19 215 lb (97.5 kg)     Exam: General: Pt appears well and is in NAD  Lungs: Clear with good BS bilat with no rales, rhonchi, or wheezes  Heart: RRR with normal S1 and S2 and no gallops; no murmurs; no rub  Extremities: No pretibial edema.   Neuro: MS is good with appropriate affect, pt is alert and Ox3    DM foot exam:06/09/2019   The skin of the feet is intact without sores or ulcerations. The pedal pulses are undetectable The sensation is intact to a screening 5.07, 10 gram monofilament bilaterally            DATA REVIEWED:  Lab Results  Component Value Date   HGBA1C 10.3 (A) 09/13/2019   HGBA1C 11.4 (H) 06/28/2019   HGBA1C 12.0 (H) 05/10/2019   Lab Results  Component Value Date   MICROALBUR 2.0 (H) 06/09/2019   LDLCALC 55 06/09/2019   CREATININE 1.19 (H) 10/31/2019   Lab Results  Component Value Date   MICRALBCREAT 1.0 06/09/2019     Lab Results  Component Value Date   CHOL 112 06/09/2019   HDL 29.90 (L) 06/09/2019   LDLCALC 55 06/09/2019   TRIG 131.0 06/09/2019   CHOLHDL 4 06/09/2019        Results for Keyworth, Arah A (MRN [id number]) as of 06/09/2019 13:46  Ref. Range 06/09/2019 08:18  Sodium Latest Ref Range: 135 - 145 mEq/L 140  Potassium Latest Ref Range: 3.5 - 5.1 mEq/L 4.0  Chloride Latest Ref Range: 96 - 112 mEq/L 102  CO2 Latest Ref Range: 19 - 32 mEq/L 30  Glucose Latest Ref Range: 70 - 99 mg/dL 128 (H)  BUN Latest Ref Range: 6 - 23 mg/dL 29 (H)  Creatinine Latest Ref Range: 0.40 - 1.20 mg/dL 1.68 (H)  Calcium Latest Ref Range: 8.4 - 10.5 mg/dL 9.0  GFR Latest Ref Range: >60.00 mL/min 38.58 (L)   ASSESSMENT / PLAN / RECOMMENDATIONS:   1) Type 2 Diabetes Mellitus, Poorly controlled, With Neuropathic and CKD III complications - Most recent A1c of 10.3 %. Goal A1c < 7.0 %.    -Her glycemic  control continues to slowly improve but she continues with fluctuating BG's. -I have expressed my concern about her new diet, as it seems like she snacks more than she should she will hold off on this diet until she meets with our RD. -Limited add-on therapy due to low GFR  -Given her symptoms of shaking sometime between breakfast and lunch I will adjust her insulin dose as below. -She is interested in restarting a GLP-1 agonist, in the past her insurance will not Cover for it, I have discussed this with her but she states she will try and pay for this time  MEDICATIONS:  Metformin 1000 mg once tablet daily  Novolog Mix 24 units with breakfast and 30 units with supper  Trulicity A999333 mg weekly  EDUCATION / INSTRUCTIONS:  BG monitoring instructions: Patient is instructed to check her blood sugars 2 times a day, fasting and supper.  Call [place] [facility] if: BG persistently < 70 or > 300. . I reviewed the Rule of 15 for the treatment of hypoglycemia in detail with the patient. Literature supplied.   2) Diabetic complications:   Eye: Does not have known diabetic retinopathy.  Patient strongly encouraged to have an eye exam ASAP  Neuro/ Feet: Does  have known diabetic peripheral neuropathy .   Renal: Patient does  have known baseline CKD III       F/U in 3 months     Signed electronically by: Mack Guise, MD  [facility]  [facility] [address] [place], [place] [address] Phone: [phone] FAX: [phone]   CC: Inda Coke, [place] [address] Phone: [phone]  Fax: [phone]  Return to Endocrinology clinic as below: Future Appointments  Date Time Provider [place]  12/13/2019  7:30 AM Caedin Mogan, Melanie Crazier, MD [facility] None  12/21/2019 10:30 AM [facility] - ECHO 1 OUTPATIENT [facility] [place] [facility]  01/26/2020  3:30 PM Ahmed Prima, Fransisco Hertz, PA-C  [facility] [place] [facility]  01/27/2020  9:00 AM Valora Piccolo, Barnabas Lister, RD [facility]DM

## 2019-12-13 ENCOUNTER — Ambulatory Visit: Payer: 59 | Admitting: Internal Medicine

## 2019-12-13 DIAGNOSIS — Z0289 Encounter for other administrative examinations: Secondary | ICD-10-CM

## 2019-12-21 ENCOUNTER — Ambulatory Visit (HOSPITAL_COMMUNITY): Payer: 59

## 2019-12-24 ENCOUNTER — Encounter: Payer: Self-pay | Admitting: Internal Medicine

## 2019-12-26 ENCOUNTER — Emergency Department (HOSPITAL_COMMUNITY): Payer: 59

## 2019-12-26 ENCOUNTER — Encounter (HOSPITAL_COMMUNITY): Payer: Self-pay | Admitting: Emergency Medicine

## 2019-12-26 ENCOUNTER — Inpatient Hospital Stay (HOSPITAL_COMMUNITY)
Admission: EM | Admit: 2019-12-26 | Discharge: 2020-01-05 | DRG: 023 | Disposition: A | Discharge status: Skilled Nursing Facility | Payer: 59 | Attending: Neurology | Admitting: Neurology

## 2019-12-26 ENCOUNTER — Other Ambulatory Visit: Payer: Self-pay

## 2019-12-26 DIAGNOSIS — I6522 Occlusion and stenosis of left carotid artery: Secondary | ICD-10-CM | POA: Diagnosis present

## 2019-12-26 DIAGNOSIS — M1990 Unspecified osteoarthritis, unspecified site: Secondary | ICD-10-CM | POA: Diagnosis present

## 2019-12-26 DIAGNOSIS — R1310 Dysphagia, unspecified: Secondary | ICD-10-CM | POA: Diagnosis present

## 2019-12-26 DIAGNOSIS — Z7289 Other problems related to lifestyle: Secondary | ICD-10-CM

## 2019-12-26 DIAGNOSIS — I429 Cardiomyopathy, unspecified: Secondary | ICD-10-CM | POA: Diagnosis not present

## 2019-12-26 DIAGNOSIS — R402122 Coma scale, eyes open, to pain, at arrival to emergency department: Secondary | ICD-10-CM | POA: Diagnosis present

## 2019-12-26 DIAGNOSIS — K219 Gastro-esophageal reflux disease without esophagitis: Secondary | ICD-10-CM | POA: Diagnosis present

## 2019-12-26 DIAGNOSIS — E1165 Type 2 diabetes mellitus with hyperglycemia: Secondary | ICD-10-CM | POA: Diagnosis present

## 2019-12-26 DIAGNOSIS — I130 Hypertensive heart and chronic kidney disease with heart failure and stage 1 through stage 4 chronic kidney disease, or unspecified chronic kidney disease: Secondary | ICD-10-CM | POA: Diagnosis present

## 2019-12-26 DIAGNOSIS — E876 Hypokalemia: Secondary | ICD-10-CM | POA: Diagnosis present

## 2019-12-26 DIAGNOSIS — Z720 Tobacco use: Secondary | ICD-10-CM | POA: Diagnosis present

## 2019-12-26 DIAGNOSIS — Z7901 Long term (current) use of anticoagulants: Secondary | ICD-10-CM

## 2019-12-26 DIAGNOSIS — I10 Essential (primary) hypertension: Secondary | ICD-10-CM | POA: Diagnosis present

## 2019-12-26 DIAGNOSIS — Z9911 Dependence on respirator [ventilator] status: Secondary | ICD-10-CM

## 2019-12-26 DIAGNOSIS — J9691 Respiratory failure, unspecified with hypoxia: Secondary | ICD-10-CM | POA: Diagnosis present

## 2019-12-26 DIAGNOSIS — R414 Neurologic neglect syndrome: Secondary | ICD-10-CM | POA: Diagnosis present

## 2019-12-26 DIAGNOSIS — I4821 Permanent atrial fibrillation: Secondary | ICD-10-CM | POA: Diagnosis present

## 2019-12-26 DIAGNOSIS — E1142 Type 2 diabetes mellitus with diabetic polyneuropathy: Secondary | ICD-10-CM | POA: Diagnosis present

## 2019-12-26 DIAGNOSIS — R4701 Aphasia: Secondary | ICD-10-CM | POA: Diagnosis present

## 2019-12-26 DIAGNOSIS — G9341 Metabolic encephalopathy: Secondary | ICD-10-CM | POA: Diagnosis present

## 2019-12-26 DIAGNOSIS — J449 Chronic obstructive pulmonary disease, unspecified: Secondary | ICD-10-CM | POA: Diagnosis present

## 2019-12-26 DIAGNOSIS — E785 Hyperlipidemia, unspecified: Secondary | ICD-10-CM | POA: Diagnosis present

## 2019-12-26 DIAGNOSIS — Z20822 Contact with and (suspected) exposure to covid-19: Secondary | ICD-10-CM | POA: Diagnosis present

## 2019-12-26 DIAGNOSIS — I5023 Acute on chronic systolic (congestive) heart failure: Secondary | ICD-10-CM | POA: Diagnosis present

## 2019-12-26 DIAGNOSIS — G936 Cerebral edema: Secondary | ICD-10-CM

## 2019-12-26 DIAGNOSIS — Z79899 Other long term (current) drug therapy: Secondary | ICD-10-CM

## 2019-12-26 DIAGNOSIS — E870 Hyperosmolality and hypernatremia: Secondary | ICD-10-CM | POA: Diagnosis present

## 2019-12-26 DIAGNOSIS — Z833 Family history of diabetes mellitus: Secondary | ICD-10-CM

## 2019-12-26 DIAGNOSIS — E1143 Type 2 diabetes mellitus with diabetic autonomic (poly)neuropathy: Secondary | ICD-10-CM | POA: Diagnosis present

## 2019-12-26 DIAGNOSIS — Y92000 Kitchen of unspecified non-institutional (private) residence as  the place of occurrence of the external cause: Secondary | ICD-10-CM

## 2019-12-26 DIAGNOSIS — I69391 Dysphagia following cerebral infarction: Secondary | ICD-10-CM

## 2019-12-26 DIAGNOSIS — R233 Spontaneous ecchymoses: Secondary | ICD-10-CM | POA: Diagnosis present

## 2019-12-26 DIAGNOSIS — M109 Gout, unspecified: Secondary | ICD-10-CM | POA: Diagnosis present

## 2019-12-26 DIAGNOSIS — N83209 Unspecified ovarian cyst, unspecified side: Secondary | ICD-10-CM | POA: Diagnosis present

## 2019-12-26 DIAGNOSIS — R402212 Coma scale, best verbal response, none, at arrival to emergency department: Secondary | ICD-10-CM | POA: Diagnosis present

## 2019-12-26 DIAGNOSIS — I6349 Cerebral infarction due to embolism of other cerebral artery: Secondary | ICD-10-CM | POA: Diagnosis present

## 2019-12-26 DIAGNOSIS — Z91030 Bee allergy status: Secondary | ICD-10-CM

## 2019-12-26 DIAGNOSIS — W19XXXA Unspecified fall, initial encounter: Secondary | ICD-10-CM | POA: Diagnosis present

## 2019-12-26 DIAGNOSIS — J9601 Acute respiratory failure with hypoxia: Secondary | ICD-10-CM | POA: Diagnosis not present

## 2019-12-26 DIAGNOSIS — G8191 Hemiplegia, unspecified affecting right dominant side: Secondary | ICD-10-CM | POA: Diagnosis present

## 2019-12-26 DIAGNOSIS — G8929 Other chronic pain: Secondary | ICD-10-CM | POA: Diagnosis present

## 2019-12-26 DIAGNOSIS — I639 Cerebral infarction, unspecified: Secondary | ICD-10-CM | POA: Diagnosis not present

## 2019-12-26 DIAGNOSIS — I63412 Cerebral infarction due to embolism of left middle cerebral artery: Secondary | ICD-10-CM | POA: Diagnosis not present

## 2019-12-26 DIAGNOSIS — I619 Nontraumatic intracerebral hemorrhage, unspecified: Secondary | ICD-10-CM | POA: Diagnosis present

## 2019-12-26 DIAGNOSIS — Z794 Long term (current) use of insulin: Secondary | ICD-10-CM

## 2019-12-26 DIAGNOSIS — K760 Fatty (change of) liver, not elsewhere classified: Secondary | ICD-10-CM | POA: Diagnosis present

## 2019-12-26 DIAGNOSIS — I6340 Cerebral infarction due to embolism of unspecified cerebral artery: Secondary | ICD-10-CM | POA: Diagnosis present

## 2019-12-26 DIAGNOSIS — Z888 Allergy status to other drugs, medicaments and biological substances status: Secondary | ICD-10-CM

## 2019-12-26 DIAGNOSIS — I4892 Unspecified atrial flutter: Secondary | ICD-10-CM | POA: Diagnosis present

## 2019-12-26 DIAGNOSIS — F17210 Nicotine dependence, cigarettes, uncomplicated: Secondary | ICD-10-CM | POA: Diagnosis present

## 2019-12-26 DIAGNOSIS — E1122 Type 2 diabetes mellitus with diabetic chronic kidney disease: Secondary | ICD-10-CM | POA: Diagnosis present

## 2019-12-26 DIAGNOSIS — N1831 Chronic kidney disease, stage 3a: Secondary | ICD-10-CM | POA: Diagnosis present

## 2019-12-26 DIAGNOSIS — R402342 Coma scale, best motor response, flexion withdrawal, at arrival to emergency department: Secondary | ICD-10-CM | POA: Diagnosis present

## 2019-12-26 DIAGNOSIS — Z9889 Other specified postprocedural states: Secondary | ICD-10-CM

## 2019-12-26 DIAGNOSIS — E669 Obesity, unspecified: Secondary | ICD-10-CM | POA: Diagnosis present

## 2019-12-26 DIAGNOSIS — I720 Aneurysm of carotid artery: Secondary | ICD-10-CM

## 2019-12-26 DIAGNOSIS — I4891 Unspecified atrial fibrillation: Secondary | ICD-10-CM | POA: Diagnosis present

## 2019-12-26 DIAGNOSIS — Z880 Allergy status to penicillin: Secondary | ICD-10-CM

## 2019-12-26 DIAGNOSIS — I671 Cerebral aneurysm, nonruptured: Secondary | ICD-10-CM | POA: Diagnosis not present

## 2019-12-26 DIAGNOSIS — Z978 Presence of other specified devices: Secondary | ICD-10-CM

## 2019-12-26 DIAGNOSIS — Z6834 Body mass index (BMI) 34.0-34.9, adult: Secondary | ICD-10-CM

## 2019-12-26 DIAGNOSIS — Z811 Family history of alcohol abuse and dependence: Secondary | ICD-10-CM

## 2019-12-26 DIAGNOSIS — R29727 NIHSS score 27: Secondary | ICD-10-CM | POA: Diagnosis present

## 2019-12-26 DIAGNOSIS — F1410 Cocaine abuse, uncomplicated: Secondary | ICD-10-CM | POA: Diagnosis present

## 2019-12-26 DIAGNOSIS — R000 Tachycardia, unspecified: Secondary | ICD-10-CM | POA: Diagnosis present

## 2019-12-26 DIAGNOSIS — G510 Bell's palsy: Secondary | ICD-10-CM | POA: Diagnosis present

## 2019-12-26 DIAGNOSIS — D649 Anemia, unspecified: Secondary | ICD-10-CM | POA: Diagnosis present

## 2019-12-26 HISTORY — DX: Cerebral infarction, unspecified: I63.9

## 2019-12-26 LAB — CBC WITH DIFFERENTIAL/PLATELET
Abs Immature Granulocytes: 0.02 10*3/uL (ref 0.00–0.07)
Basophils Absolute: 0.1 10*3/uL (ref 0.0–0.1)
Basophils Relative: 1 %
Eosinophils Absolute: 0.1 10*3/uL (ref 0.0–0.5)
Eosinophils Relative: 2 %
HCT: 42 % (ref 36.0–46.0)
Hemoglobin: 13.5 g/dL (ref 12.0–15.0)
Immature Granulocytes: 0 %
Lymphocytes Relative: 26 %
Lymphs Abs: 2.1 10*3/uL (ref 0.7–4.0)
MCH: 29.5 pg (ref 26.0–34.0)
MCHC: 32.1 g/dL (ref 30.0–36.0)
MCV: 91.7 fL (ref 80.0–100.0)
Monocytes Absolute: 0.6 10*3/uL (ref 0.1–1.0)
Monocytes Relative: 8 %
Neutro Abs: 5.1 10*3/uL (ref 1.7–7.7)
Neutrophils Relative %: 63 %
Platelets: 221 10*3/uL (ref 150–400)
RBC: 4.58 MIL/uL (ref 3.87–5.11)
RDW: 14 % (ref 11.5–15.5)
WBC: 8.1 10*3/uL (ref 4.0–10.5)
nRBC: 0 % (ref 0.0–0.2)

## 2019-12-26 LAB — URINALYSIS, ROUTINE W REFLEX MICROSCOPIC
Bacteria, UA: NONE SEEN
Bilirubin Urine: NEGATIVE
Glucose, UA: >=500 mg/dL — AB
Hgb urine dipstick: NEGATIVE
Ketones, ur: NEGATIVE mg/dL
Nitrite: NEGATIVE
Protein, ur: 100 mg/dL — AB
Specific Gravity, Urine: 1.02 (ref 1.005–1.030)
WBC, UA: >50 WBC/hpf — ABNORMAL HIGH (ref 0–5)
pH: 6 (ref 5.0–8.0)

## 2019-12-26 LAB — PROTIME-INR
INR: 1.2 (ref 0.8–1.2)
Prothrombin Time: 14.6 seconds (ref 11.4–15.2)

## 2019-12-26 LAB — RAPID URINE DRUG SCREEN, HOSP PERFORMED
Amphetamines: NOT DETECTED
Barbiturates: NOT DETECTED
Benzodiazepines: NOT DETECTED
Cocaine: POSITIVE — AB
Opiates: NOT DETECTED
Tetrahydrocannabinol: NOT DETECTED

## 2019-12-26 LAB — COMPREHENSIVE METABOLIC PANEL
ALT: 26 U/L (ref 0–44)
AST: 36 U/L (ref 15–41)
Albumin: 3.7 g/dL (ref 3.5–5.0)
Alkaline Phosphatase: 87 U/L (ref 38–126)
Anion gap: 11 (ref 5–15)
BUN: 15 mg/dL (ref 6–20)
CO2: 21 mmol/L — ABNORMAL LOW (ref 22–32)
Calcium: 8.7 mg/dL — ABNORMAL LOW (ref 8.9–10.3)
Chloride: 104 mmol/L (ref 98–111)
Creatinine, Ser: 1.15 mg/dL — ABNORMAL HIGH (ref 0.44–1.00)
GFR calc Af Amer: >60 mL/min (ref >60)
GFR calc non Af Amer: 54 mL/min — ABNORMAL LOW (ref >60)
Glucose, Bld: 355 mg/dL — ABNORMAL HIGH (ref 70–99)
Potassium: 3.9 mmol/L (ref 3.5–5.1)
Sodium: 136 mmol/L (ref 135–145)
Total Bilirubin: 0.5 mg/dL (ref 0.3–1.2)
Total Protein: 7 g/dL (ref 6.5–8.1)

## 2019-12-26 LAB — BRAIN NATRIURETIC PEPTIDE: B Natriuretic Peptide: 367 pg/mL — ABNORMAL HIGH (ref 0.0–100.0)

## 2019-12-26 LAB — ETHANOL: Alcohol, Ethyl (B): <10 mg/dL (ref <10)

## 2019-12-26 LAB — PREGNANCY, URINE: Preg Test, Ur: NEGATIVE

## 2019-12-26 LAB — APTT: aPTT: 23 seconds — ABNORMAL LOW (ref 24–36)

## 2019-12-26 LAB — POC URINE PREG, ED: Preg Test, Ur: NEGATIVE

## 2019-12-26 LAB — TROPONIN I (HIGH SENSITIVITY): Troponin I (High Sensitivity): 10 ng/L (ref <18)

## 2019-12-26 LAB — CBG MONITORING, ED: Glucose-Capillary: 319 mg/dL — ABNORMAL HIGH (ref 70–99)

## 2019-12-26 IMAGING — CT CT HEAD CODE STROKE
3 series · 14 of 47 positions shown, 16 images · non-contrast
Comparison: CT prior head CT from [DATE].

CLINICAL DATA: Code stroke. Initial evaluation for acute
unresponsiveness.

EXAM:
CT HEAD WITHOUT CONTRAST
TECHNIQUE: Contiguous axial images were obtained from the base of the skull
through the vertex without intravenous contrast.

[Series 2: head w o · axial · 0.48mm/px · z∈[+11,+136]mm · 8 of 31 slices shown, 10 images]
[im 3/31  brain]
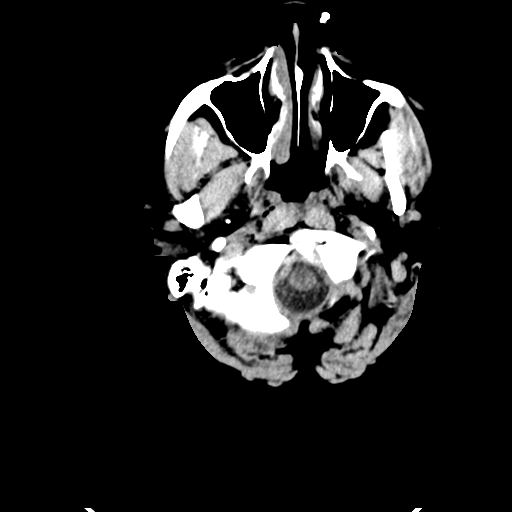
[im 3/31  bone]
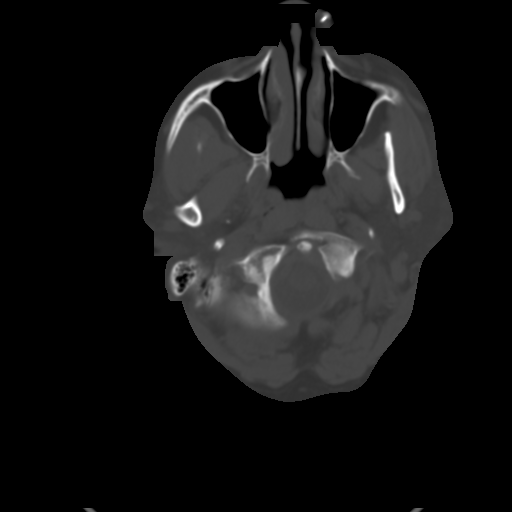
[im 7/31  brain]
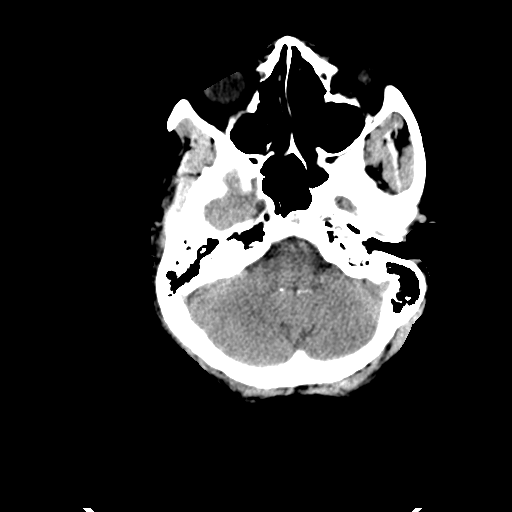
[im 10/31  brain]
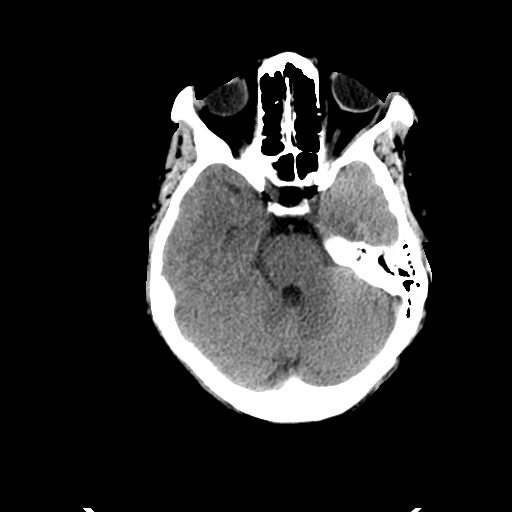
[im 14/31  brain]
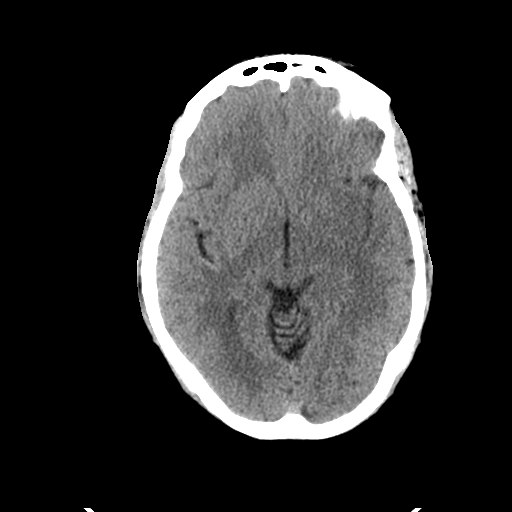
[im 17/31  brain]
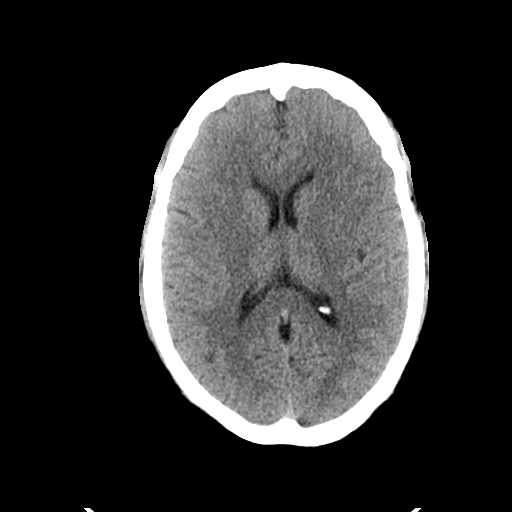
[im 17/31  bone]
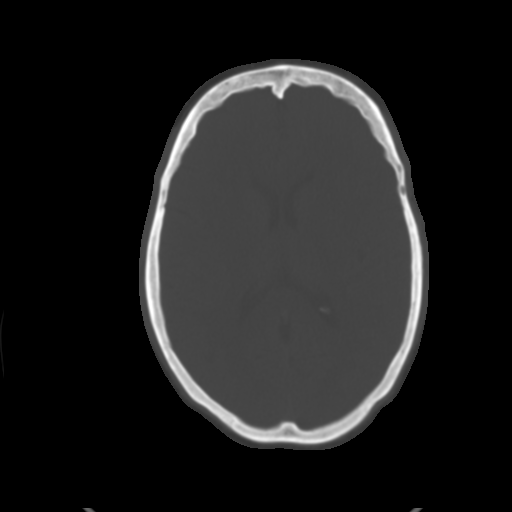
[im 21/31  brain]
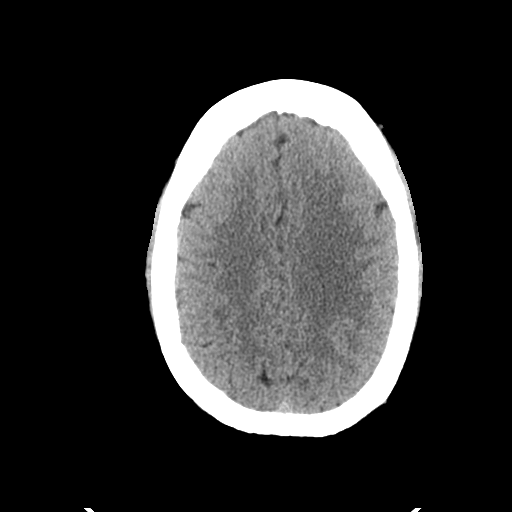
[im 24/31  brain]
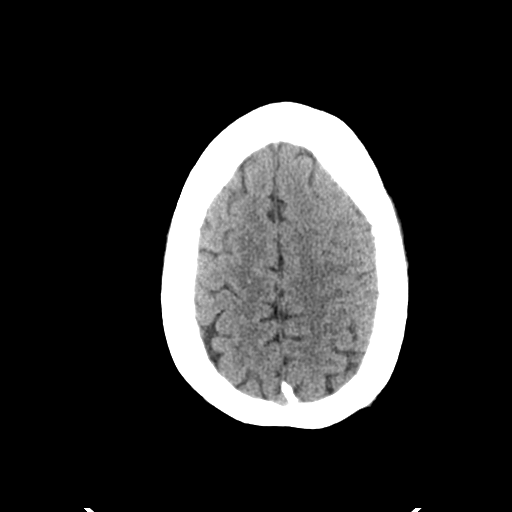
[im 28/31  brain]
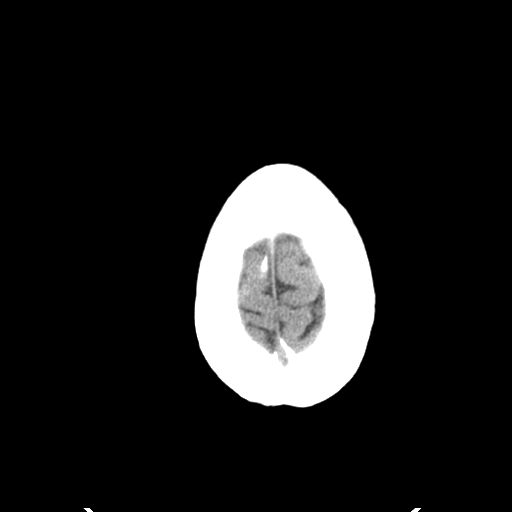

[Series 4: coronal soft · coronal · 0.33mm/px · 3 of 71 slices shown]
[im 24/71  brain]
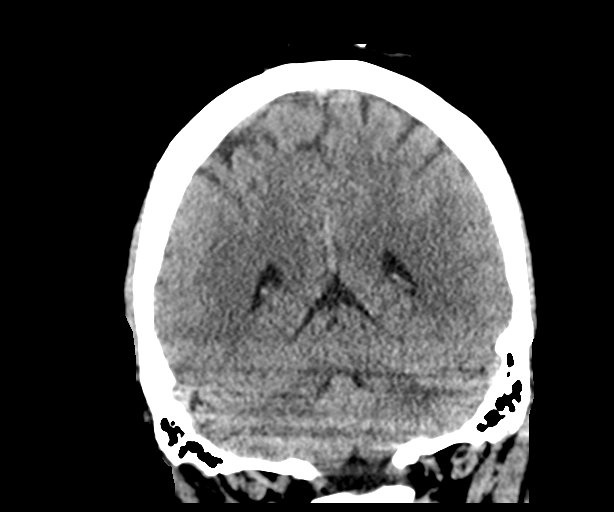
[im 32/71  brain]
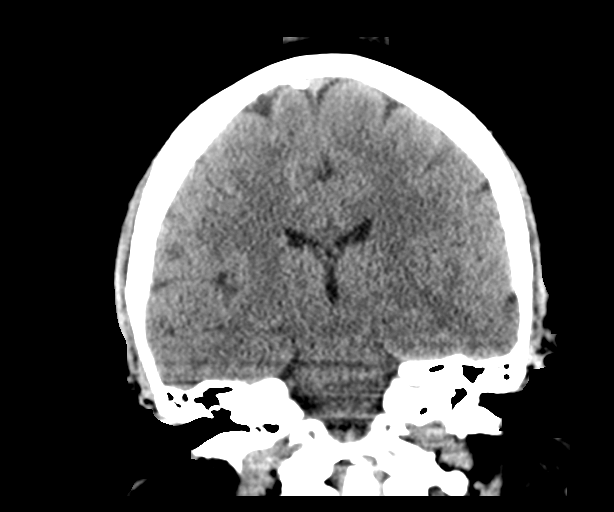
[im 39/71  brain]
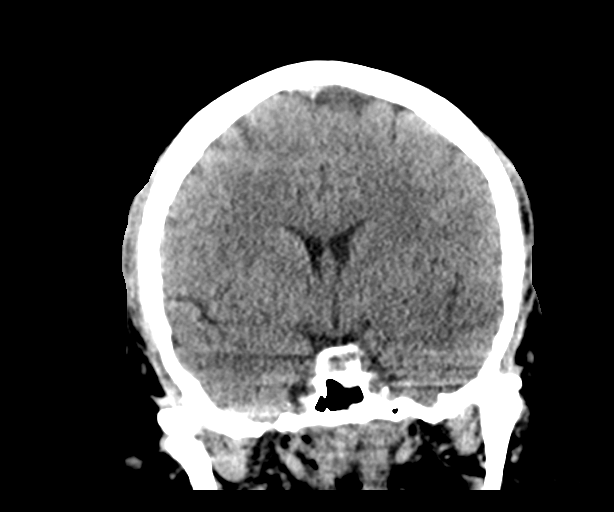

[Series 5: sagittal soft · sagittal · 0.33mm/px · 3 of 59 slices shown]
[im 20/59  brain]
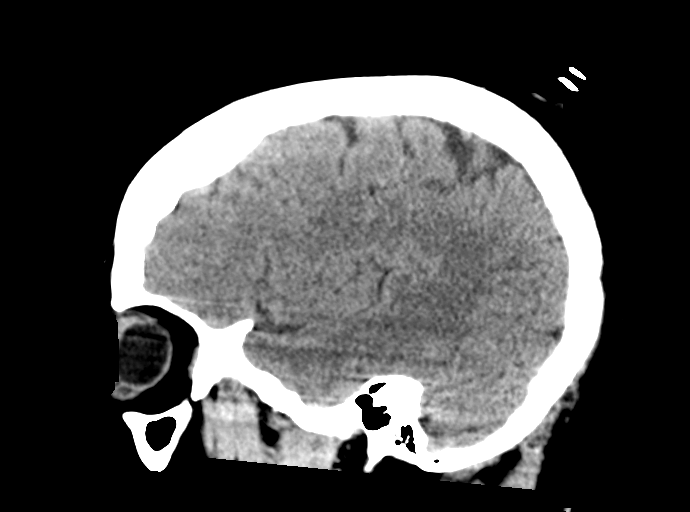
[im 30/59  brain]
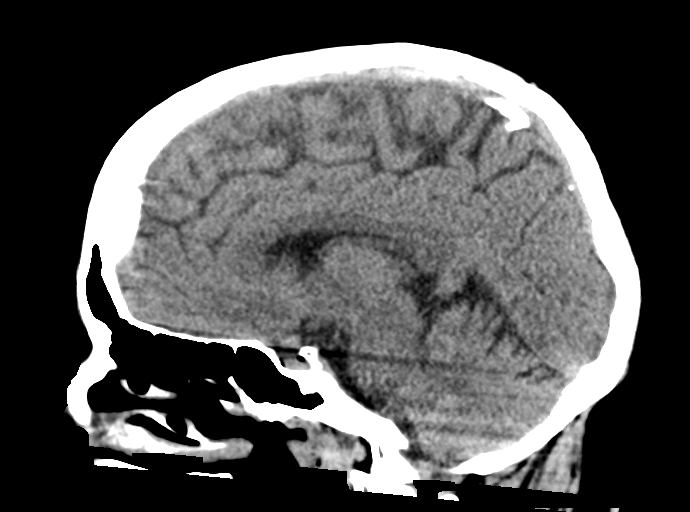
[im 39/59  brain]
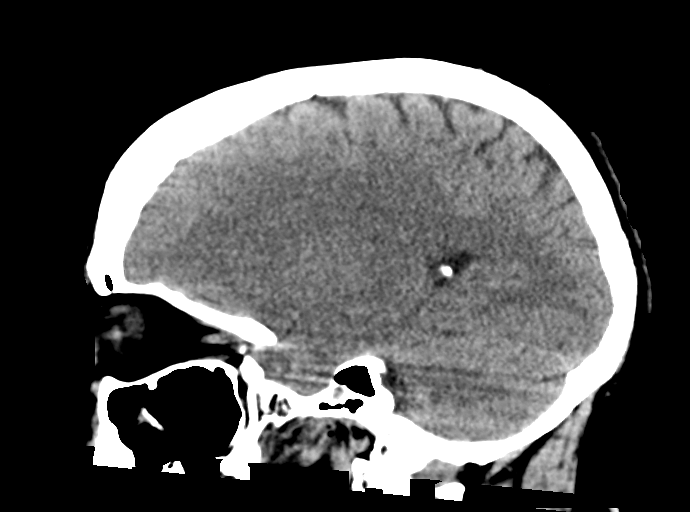

[14 of 47 positions shown; findings below may reference images not displayed]

FINDINGS: Brain: Examination somewhat technically limited by motion artifact
with streak artifact through the skull base. There is question of
asymmetric hypodensity involving the left basal ganglia, with
involvement of the left caudate and lentiform nuclei. Additionally,
the left insula and adjacent left temporal lobe appear hypodense as
well. No associated mass effect. No intracranial hemorrhage. No
other evidence for acute large vessel territory infarct. No mass
lesion or midline shift. No hydrocephalus or extra-axial fluid
collection.

Vascular: Question asymmetric hyperdensity at the left ICA terminus
(series 2, image 11). No other hyperdense vessel.

Skull: Scalp soft tissues within normal limits.  Calvarium intact.

Sinuses/Orbits: Globes and orbital soft tissues demonstrate no acute
finding. Paranasal sinuses are clear. No mastoid effusion.

Other: None.

ASPECTS (Alberta Stroke Program Early CT Score)

- Ganglionic level infarction (caudate, lentiform nuclei, internal
capsule, insula, M1-M3 cortex): 3

- Supraganglionic infarction (M4-M6 cortex): 3

Total score (0-10 with 10 being normal): 6
IMPRESSION: 1. Question evolving hypodensity involving the left basal ganglia,
left insula, and adjacent left temporal lobe, which could reflect an
evolving left MCA territory infarct. Additionally, there is question
of asymmetric hyperdensity at the left ICA terminus, which could
reflect intraluminal thrombus/LVO. Finding somewhat difficult to be
certain given at there is some motion and streak artifact on this
exam. Further assessment with dedicated CTA of the head and neck
recommended.
2. ASPECTS is 6.

Critical Value/emergent results were called by telephone at the time
of interpretation on [DATE] at [DATE] to provider VIRK
, who verbally acknowledged these results.

## 2019-12-26 IMAGING — DX DG CHEST 1V PORT
1 series · 1 of 1 positions shown · non-contrast
Comparison: CTa [DATE], radiograph [DATE]

CLINICAL DATA: Weakness, altered mental status, could stroke

EXAM:
PORTABLE CHEST 1 VIEW

[chest ap]
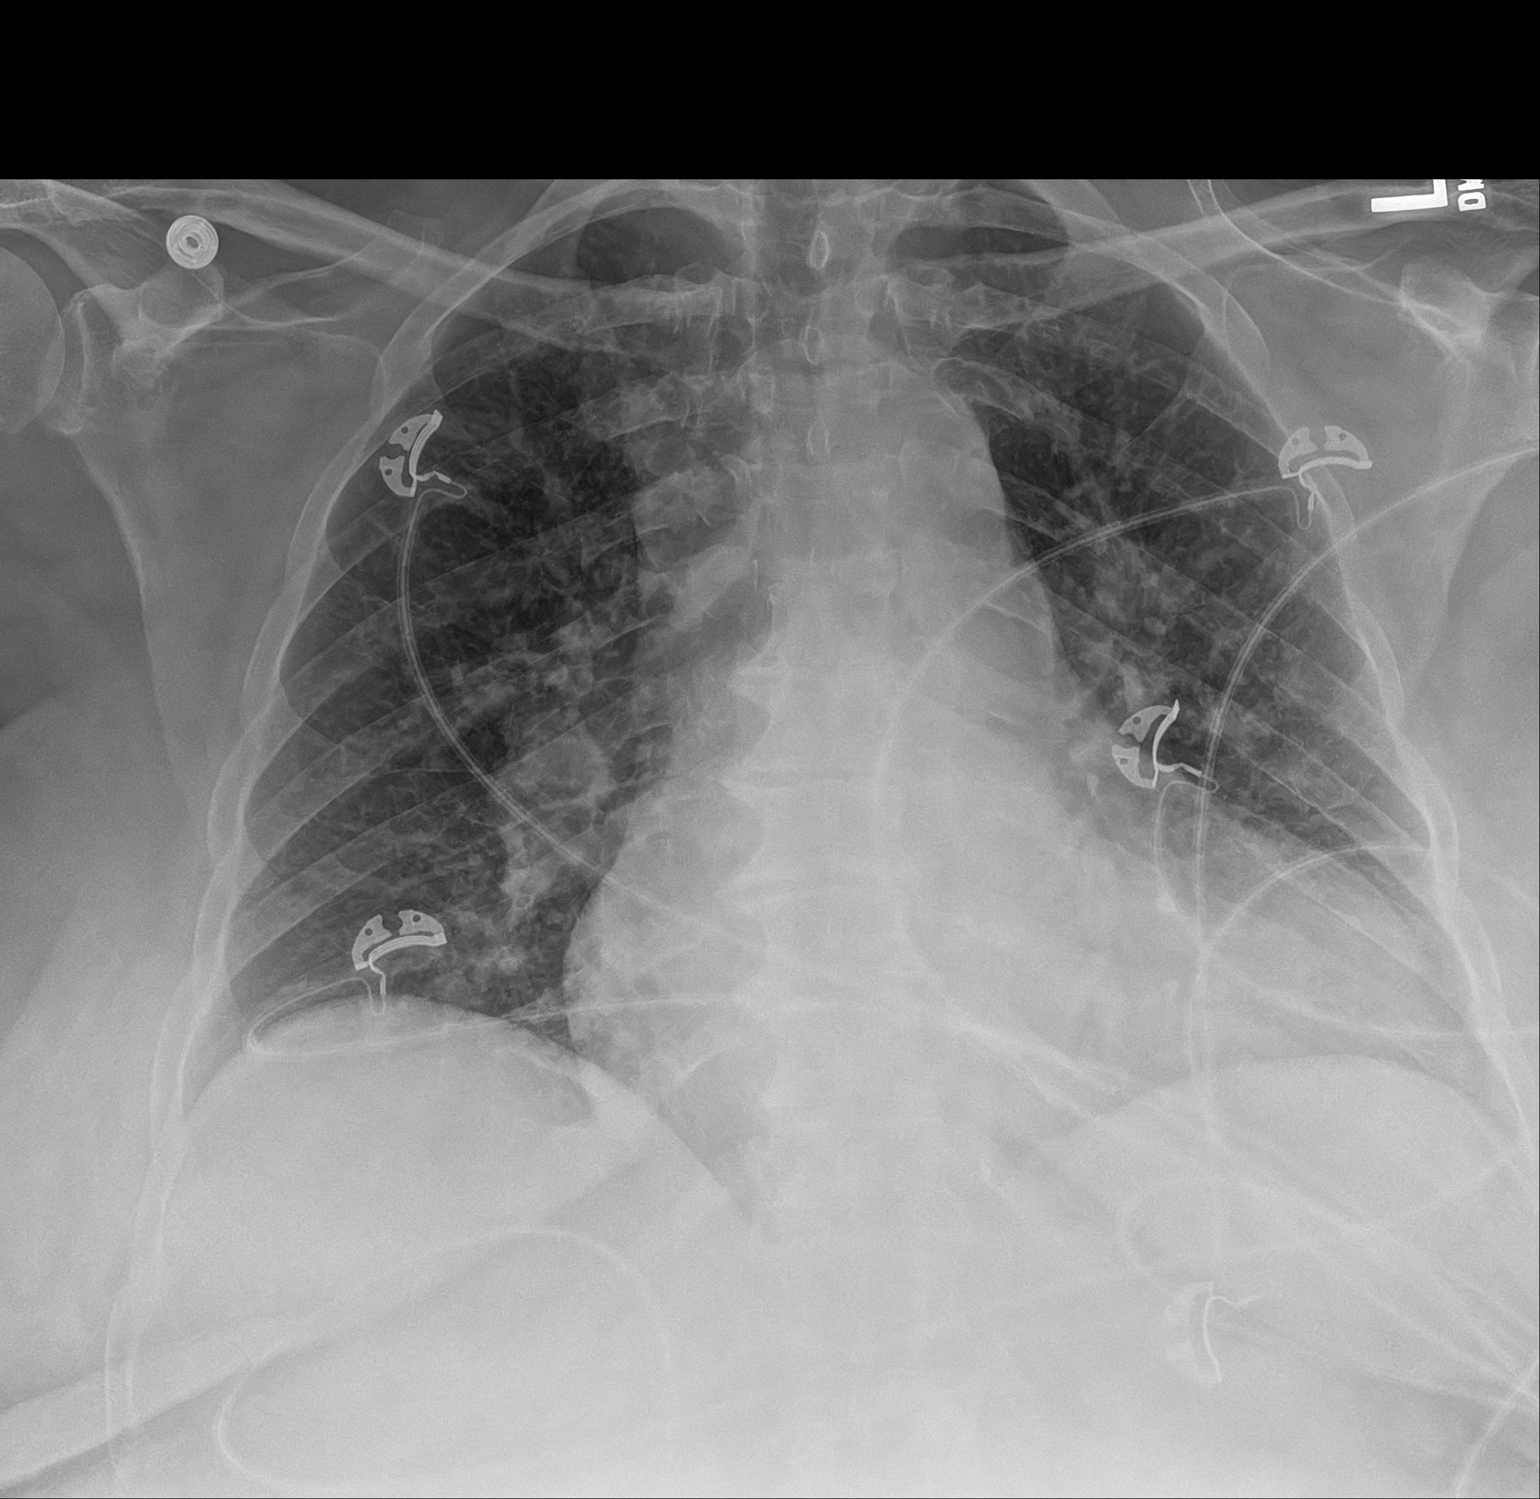

[1 of 1 positions shown; findings below may reference images not displayed]

FINDINGS: Low volumes and central vascular crowding as well as slight
prominence of the central pulmonary arteries which may reflect
pulmonary artery hypertension. Streaky opacities favor atelectasis
in the setting. No focal consolidative process. No pneumothorax or
effusion. Cardiac silhouette is enlarged, similar to priors.
Remaining cardiomediastinal contours are unremarkable. No acute
osseous or soft tissue abnormality. Telemetry leads overlie the
chest.
IMPRESSION: Low lung volumes with streaky opacities favoring atelectasis.

## 2019-12-26 MED ORDER — ALTEPLASE 100 MG IV SOLR
INTRAVENOUS | Status: AC
  Filled 2019-12-26: qty 100

## 2019-12-26 MED ORDER — NALOXONE HCL 0.4 MG/ML IJ SOLN
2.0000 mg | Freq: Once | INTRAMUSCULAR | Status: AC
  Administered 2019-12-26: 2 mg via INTRAVENOUS
  Filled 2019-12-26: qty 5

## 2019-12-26 MED ORDER — LABETALOL HCL 5 MG/ML IV SOLN
INTRAVENOUS | Status: AC
  Filled 2019-12-26: qty 4

## 2019-12-26 MED ORDER — NALOXONE HCL 2 MG/2ML IJ SOSY
PREFILLED_SYRINGE | INTRAMUSCULAR | Status: AC
  Filled 2019-12-26: qty 2

## 2019-12-26 MED ORDER — IOHEXOL 350 MG/ML SOLN
75.0000 mL | Freq: Once | INTRAVENOUS | Status: AC | PRN
  Administered 2019-12-26: 100 mL via INTRAVENOUS

## 2019-12-26 NOTE — ED Notes (Signed)
Pt earrings and necklace placed in bag of pt belongings.

## 2019-12-26 NOTE — Consult Note (Addendum)
[facility] Tele[facility]urology Consult Services   Date of Service:   12/26/2019 23:07:25  Impression:     .  I63.9 - Cerebrovascular accident (CVA), unspecified mechanism ([place])  Comments/Sign-Out: 54 y/o woman with h/o HTN, DM, and atrial fibrillation (on Elliquis - unknown last dose). who presents to the ED with AMS and right arm and facial weakness. Neuro-IR to be consulted for LVO.  Metrics: Last Known Well: 12/26/2019 22:00:00 [facility] Notification Time: 12/26/2019 [id number] Arrival Time: 12/26/2019 22:40:00 Stamp Time: 12/26/2019 23:07:25 Time First Login Attempt: 12/26/2019 23:10:58 Symptoms: AMS NIHSS Start Assessment Time: 12/26/2019 23:11:22 Patient is not a candidate for Alteplase/Activase. Alteplase Medical Decision: 12/26/2019 23:32:57 Patient was not deemed candidate for Alteplase/Activase thrombolytics because of following reasons: On Elliquis - son reports patient has been compliant with her medication.  CT head was reviewed.  Clinical Presentation is Suggestive of Large Vessel Occlusive Disease, Recommendations are as Follows  CTA Head and Neck. CT Perfusion.   ED Physician notified of diagnostic impression and management plan on 12/26/2019 23:40:48  Alteplase/Activase Contraindications:  Our recommendations are outlined below.  Recommendations:     .  Activate Stroke Protocol Admission/Order Set     .  Stroke/Telemetry Floor     .  Neuro Checks     .  Bedside Swallow Eval     .  DVT Prophylaxis     .  IV Fluids, Normal Saline     .  Head of Bed 30 Degrees     .  Euglycemia and Avoid Hyperthermia (PRN Acetaminophen)     .  ASA     .  Covid testing   Sign Out:     .  Discussed with Emergency Department Provider    ------------------------------------------------------------------------------  History of Present Illness: Patient is a 53 year old Female.  Patient was brought by EMS for symptoms of AMS  54 y/o woman with  h/o HTN, DM, and atrial fibrillation (on Elliquis - unknown last dose). who presents to the ED with AMS. Emergent telestroke requested. Last reported normal at 2200. Patient reportedly fell at home and found by family members on the floor. ED reports the family heard the patient fall. Son at bedside. Last progress note on 4/30 reports Elliquis on her outpatient medication list. Patient appears to have a right facial droop and right upper extremity monoplegia. Hyperdense left MCA clot seen on CT. Son at bedside who states the patient reports the patient has been compliant with her medication.       Examination: BP(150/130), Pulse(114), Blood Glucose(319) 1A: Level of Consciousness - Requires repeated stimulation to arouse + 2 1B: Ask Month and Age - Could Not Answer Either Question Correctly + 2 1C: Blink Eyes & Squeeze Hands - Performs 0 Tasks + 2 2: Test Horizontal Extraocular Movements - Forced Gaze Palsy: Cannot Be Overcome + 2 3: Test Visual Fields - No Visual Loss + 0 4: Test Facial Palsy (Use Grimace if Obtunded) - Partial paralysis (lower face) + 2 5A: Test Left Arm Motor Drift - Some Effort Against Gravity + 2 5B: Test Right Arm Motor Drift - No Movement + 4 6A: Test Left Leg Motor Drift - No Effort Against Gravity + 3 6B: Test Right Leg Motor Drift - No Effort Against Gravity + 3 7: Test Limb Ataxia (FNF/Heel-Shin) - No Ataxia + 0 8: Test Sensation - Coma/Unresponsive + 2 9: Test Language/Aphasia - Mute/Global Aphasia: No Usable Speech/Auditory Comprehension + 3 10: Test Dysarthria - Normal + 0  11: Test Extinction/Inattention - No abnormality + 0  NIHSS Score: 27  Pre-Morbid Modified Ranking Scale: Unable to assess   Patient/Family was informed the Neurology Consult would occur via TeleHealth consult by way of interactive audio and video telecommunications and consented to receiving care in this manner.   Patient is being evaluated for possible acute neurologic impairment  and high probability of imminent or life-threatening deterioration. I spent total of 35 minutes providing care to this patient, including time for face to face visit via telemedicine, review of medical records, imaging studies and discussion of findings with providers, the patient and/or family.   Dr Meryl Crutch   [facility] [phone]  Case [id number]  Addendum [id number] - case discussed with neuro at [facility]. Patient to be transferred for possible thrombectomy. Case discussed with ED attending
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
## 2019-12-26 NOTE — ED Triage Notes (Signed)
[facility] - pt found unresponsive, CBG 379. 2mg  Narcan given, pt became slightly more responsive.

## 2019-12-26 NOTE — ED Notes (Addendum)
Pt responded some to 2mg  Narcan. Pt can hold left arm up, but right arm drops to the bed without any effort against gravity. EDP aware, calling Code Stroke

## 2019-12-26 NOTE — ED Notes (Signed)
Per son, pt was c/o feeling hot right before she fell out. LKW 2200, 911 called at 2202.

## 2019-12-27 ENCOUNTER — Inpatient Hospital Stay (HOSPITAL_COMMUNITY): Payer: 59

## 2019-12-27 ENCOUNTER — Emergency Department (HOSPITAL_COMMUNITY): Payer: 59 | Admitting: Anesthesiology

## 2019-12-27 ENCOUNTER — Emergency Department (HOSPITAL_COMMUNITY): Payer: 59

## 2019-12-27 ENCOUNTER — Encounter (HOSPITAL_COMMUNITY): Payer: Self-pay | Admitting: Interventional Radiology

## 2019-12-27 ENCOUNTER — Encounter (HOSPITAL_COMMUNITY)
Admission: EM | Disposition: A | Discharge status: Rehabilitation Facility | Payer: Self-pay | Source: Home / Self Care | Attending: Neurology

## 2019-12-27 DIAGNOSIS — E1142 Type 2 diabetes mellitus with diabetic polyneuropathy: Secondary | ICD-10-CM | POA: Diagnosis not present

## 2019-12-27 DIAGNOSIS — I5032 Chronic diastolic (congestive) heart failure: Secondary | ICD-10-CM | POA: Diagnosis not present

## 2019-12-27 DIAGNOSIS — Z978 Presence of other specified devices: Secondary | ICD-10-CM | POA: Diagnosis not present

## 2019-12-27 DIAGNOSIS — Z23 Encounter for immunization: Secondary | ICD-10-CM | POA: Diagnosis not present

## 2019-12-27 DIAGNOSIS — I4891 Unspecified atrial fibrillation: Secondary | ICD-10-CM

## 2019-12-27 DIAGNOSIS — I6340 Cerebral infarction due to embolism of unspecified cerebral artery: Secondary | ICD-10-CM | POA: Diagnosis not present

## 2019-12-27 DIAGNOSIS — J9601 Acute respiratory failure with hypoxia: Secondary | ICD-10-CM | POA: Diagnosis not present

## 2019-12-27 DIAGNOSIS — I5023 Acute on chronic systolic (congestive) heart failure: Secondary | ICD-10-CM | POA: Diagnosis not present

## 2019-12-27 DIAGNOSIS — I110 Hypertensive heart disease with heart failure: Secondary | ICD-10-CM | POA: Diagnosis not present

## 2019-12-27 DIAGNOSIS — I10 Essential (primary) hypertension: Secondary | ICD-10-CM | POA: Diagnosis not present

## 2019-12-27 DIAGNOSIS — Z6834 Body mass index (BMI) 34.0-34.9, adult: Secondary | ICD-10-CM | POA: Diagnosis not present

## 2019-12-27 DIAGNOSIS — G9341 Metabolic encephalopathy: Secondary | ICD-10-CM | POA: Diagnosis present

## 2019-12-27 DIAGNOSIS — I350 Nonrheumatic aortic (valve) stenosis: Secondary | ICD-10-CM

## 2019-12-27 DIAGNOSIS — I63412 Cerebral infarction due to embolism of left middle cerebral artery: Principal | ICD-10-CM

## 2019-12-27 DIAGNOSIS — I429 Cardiomyopathy, unspecified: Secondary | ICD-10-CM | POA: Diagnosis not present

## 2019-12-27 DIAGNOSIS — I4821 Permanent atrial fibrillation: Secondary | ICD-10-CM | POA: Diagnosis present

## 2019-12-27 DIAGNOSIS — I480 Paroxysmal atrial fibrillation: Secondary | ICD-10-CM | POA: Diagnosis not present

## 2019-12-27 DIAGNOSIS — E669 Obesity, unspecified: Secondary | ICD-10-CM | POA: Diagnosis present

## 2019-12-27 DIAGNOSIS — I130 Hypertensive heart and chronic kidney disease with heart failure and stage 1 through stage 4 chronic kidney disease, or unspecified chronic kidney disease: Secondary | ICD-10-CM | POA: Diagnosis present

## 2019-12-27 DIAGNOSIS — R1310 Dysphagia, unspecified: Secondary | ICD-10-CM | POA: Diagnosis not present

## 2019-12-27 DIAGNOSIS — G936 Cerebral edema: Secondary | ICD-10-CM | POA: Diagnosis present

## 2019-12-27 DIAGNOSIS — R402122 Coma scale, eyes open, to pain, at arrival to emergency department: Secondary | ICD-10-CM | POA: Diagnosis present

## 2019-12-27 DIAGNOSIS — I63512 Cerebral infarction due to unspecified occlusion or stenosis of left middle cerebral artery: Secondary | ICD-10-CM | POA: Diagnosis not present

## 2019-12-27 DIAGNOSIS — N179 Acute kidney failure, unspecified: Secondary | ICD-10-CM | POA: Diagnosis not present

## 2019-12-27 DIAGNOSIS — I4819 Other persistent atrial fibrillation: Secondary | ICD-10-CM | POA: Diagnosis not present

## 2019-12-27 DIAGNOSIS — K219 Gastro-esophageal reflux disease without esophagitis: Secondary | ICD-10-CM | POA: Diagnosis present

## 2019-12-27 DIAGNOSIS — I4892 Unspecified atrial flutter: Secondary | ICD-10-CM | POA: Diagnosis present

## 2019-12-27 DIAGNOSIS — N390 Urinary tract infection, site not specified: Secondary | ICD-10-CM | POA: Diagnosis not present

## 2019-12-27 DIAGNOSIS — I69298 Other sequelae of other nontraumatic intracranial hemorrhage: Secondary | ICD-10-CM | POA: Diagnosis not present

## 2019-12-27 DIAGNOSIS — R4701 Aphasia: Secondary | ICD-10-CM | POA: Diagnosis not present

## 2019-12-27 DIAGNOSIS — I69220 Aphasia following other nontraumatic intracranial hemorrhage: Secondary | ICD-10-CM | POA: Diagnosis not present

## 2019-12-27 DIAGNOSIS — F1410 Cocaine abuse, uncomplicated: Secondary | ICD-10-CM | POA: Diagnosis not present

## 2019-12-27 DIAGNOSIS — Z9911 Dependence on respirator [ventilator] status: Secondary | ICD-10-CM | POA: Diagnosis not present

## 2019-12-27 DIAGNOSIS — I619 Nontraumatic intracerebral hemorrhage, unspecified: Secondary | ICD-10-CM | POA: Diagnosis present

## 2019-12-27 DIAGNOSIS — G8191 Hemiplegia, unspecified affecting right dominant side: Secondary | ICD-10-CM | POA: Diagnosis present

## 2019-12-27 DIAGNOSIS — Z9889 Other specified postprocedural states: Secondary | ICD-10-CM | POA: Diagnosis not present

## 2019-12-27 DIAGNOSIS — I639 Cerebral infarction, unspecified: Secondary | ICD-10-CM | POA: Diagnosis present

## 2019-12-27 DIAGNOSIS — E6609 Other obesity due to excess calories: Secondary | ICD-10-CM | POA: Diagnosis not present

## 2019-12-27 DIAGNOSIS — E1121 Type 2 diabetes mellitus with diabetic nephropathy: Secondary | ICD-10-CM | POA: Diagnosis not present

## 2019-12-27 DIAGNOSIS — R402342 Coma scale, best motor response, flexion withdrawal, at arrival to emergency department: Secondary | ICD-10-CM | POA: Diagnosis present

## 2019-12-27 DIAGNOSIS — R414 Neurologic neglect syndrome: Secondary | ICD-10-CM | POA: Diagnosis present

## 2019-12-27 DIAGNOSIS — W19XXXA Unspecified fall, initial encounter: Secondary | ICD-10-CM | POA: Diagnosis present

## 2019-12-27 DIAGNOSIS — Y92000 Kitchen of unspecified non-institutional (private) residence as  the place of occurrence of the external cause: Secondary | ICD-10-CM | POA: Diagnosis not present

## 2019-12-27 DIAGNOSIS — Z20822 Contact with and (suspected) exposure to covid-19: Secondary | ICD-10-CM | POA: Diagnosis present

## 2019-12-27 DIAGNOSIS — E870 Hyperosmolality and hypernatremia: Secondary | ICD-10-CM | POA: Diagnosis present

## 2019-12-27 DIAGNOSIS — J449 Chronic obstructive pulmonary disease, unspecified: Secondary | ICD-10-CM | POA: Diagnosis present

## 2019-12-27 DIAGNOSIS — I69291 Dysphagia following other nontraumatic intracranial hemorrhage: Secondary | ICD-10-CM | POA: Diagnosis not present

## 2019-12-27 DIAGNOSIS — I69391 Dysphagia following cerebral infarction: Secondary | ICD-10-CM | POA: Diagnosis not present

## 2019-12-27 DIAGNOSIS — R402212 Coma scale, best verbal response, none, at arrival to emergency department: Secondary | ICD-10-CM | POA: Diagnosis present

## 2019-12-27 DIAGNOSIS — E876 Hypokalemia: Secondary | ICD-10-CM | POA: Diagnosis present

## 2019-12-27 DIAGNOSIS — N1831 Chronic kidney disease, stage 3a: Secondary | ICD-10-CM | POA: Diagnosis present

## 2019-12-27 DIAGNOSIS — I720 Aneurysm of carotid artery: Secondary | ICD-10-CM | POA: Diagnosis not present

## 2019-12-27 DIAGNOSIS — E1143 Type 2 diabetes mellitus with diabetic autonomic (poly)neuropathy: Secondary | ICD-10-CM | POA: Diagnosis not present

## 2019-12-27 DIAGNOSIS — I69251 Hemiplegia and hemiparesis following other nontraumatic intracranial hemorrhage affecting right dominant side: Secondary | ICD-10-CM | POA: Diagnosis not present

## 2019-12-27 HISTORY — PX: IR CT HEAD LTD: IMG2386

## 2019-12-27 HISTORY — PX: IR PERCUTANEOUS ART THROMBECTOMY/INFUSION INTRACRANIAL INC DIAG ANGIO: IMG6087

## 2019-12-27 HISTORY — PX: IR US GUIDE VASC ACCESS RIGHT: IMG2390

## 2019-12-27 HISTORY — PX: RADIOLOGY WITH ANESTHESIA: SHX6223

## 2019-12-27 LAB — CBC WITH DIFFERENTIAL/PLATELET
Abs Immature Granulocytes: 0.03 10*3/uL (ref 0.00–0.07)
Basophils Absolute: 0 10*3/uL (ref 0.0–0.1)
Basophils Relative: 1 %
Eosinophils Absolute: 0 10*3/uL (ref 0.0–0.5)
Eosinophils Relative: 0 %
HCT: 41.6 % (ref 36.0–46.0)
Hemoglobin: 13.3 g/dL (ref 12.0–15.0)
Immature Granulocytes: 0 %
Lymphocytes Relative: 18 %
Lymphs Abs: 1.4 10*3/uL (ref 0.7–4.0)
MCH: 29.3 pg (ref 26.0–34.0)
MCHC: 32 g/dL (ref 30.0–36.0)
MCV: 91.6 fL (ref 80.0–100.0)
Monocytes Absolute: 0.3 10*3/uL (ref 0.1–1.0)
Monocytes Relative: 4 %
Neutro Abs: 6.1 10*3/uL (ref 1.7–7.7)
Neutrophils Relative %: 77 %
Platelets: 217 10*3/uL (ref 150–400)
RBC: 4.54 MIL/uL (ref 3.87–5.11)
RDW: 14 % (ref 11.5–15.5)
WBC: 7.9 10*3/uL (ref 4.0–10.5)
nRBC: 0 % (ref 0.0–0.2)

## 2019-12-27 LAB — HEMOGLOBIN A1C
Hgb A1c MFr Bld: 11.6 % — ABNORMAL HIGH (ref 4.8–5.6)
Mean Plasma Glucose: 286.22 mg/dL

## 2019-12-27 LAB — DIGOXIN LEVEL: Digoxin Level: <0.1 ng/mL — ABNORMAL LOW (ref 0.8–2.0)

## 2019-12-27 LAB — LIPID PANEL
Cholesterol: 121 mg/dL (ref 0–200)
HDL: 27 mg/dL — ABNORMAL LOW (ref >40)
LDL Cholesterol: 68 mg/dL (ref 0–99)
Total CHOL/HDL Ratio: 4.5 RATIO
Triglycerides: 128 mg/dL (ref <150)
VLDL: 26 mg/dL (ref 0–40)

## 2019-12-27 LAB — BASIC METABOLIC PANEL
Anion gap: 10 (ref 5–15)
BUN: 13 mg/dL (ref 6–20)
CO2: 20 mmol/L — ABNORMAL LOW (ref 22–32)
Calcium: 8.3 mg/dL — ABNORMAL LOW (ref 8.9–10.3)
Chloride: 106 mmol/L (ref 98–111)
Creatinine, Ser: 0.95 mg/dL (ref 0.44–1.00)
GFR calc Af Amer: >60 mL/min (ref >60)
GFR calc non Af Amer: >60 mL/min (ref >60)
Glucose, Bld: 345 mg/dL — ABNORMAL HIGH (ref 70–99)
Potassium: 4.9 mmol/L (ref 3.5–5.1)
Sodium: 136 mmol/L (ref 135–145)

## 2019-12-27 LAB — GLUCOSE, CAPILLARY
Glucose-Capillary: 299 mg/dL — ABNORMAL HIGH (ref 70–99)
Glucose-Capillary: 208 mg/dL — ABNORMAL HIGH (ref 70–99)
Glucose-Capillary: 246 mg/dL — ABNORMAL HIGH (ref 70–99)
Glucose-Capillary: 199 mg/dL — ABNORMAL HIGH (ref 70–99)
Glucose-Capillary: 270 mg/dL — ABNORMAL HIGH (ref 70–99)
Glucose-Capillary: 275 mg/dL — ABNORMAL HIGH (ref 70–99)

## 2019-12-27 LAB — ECHOCARDIOGRAM COMPLETE
Height: 66 in
Weight: 3488.56 oz

## 2019-12-27 LAB — PHOSPHORUS
Phosphorus: 3.3 mg/dL (ref 2.5–4.6)
Phosphorus: 3.4 mg/dL (ref 2.5–4.6)

## 2019-12-27 LAB — MAGNESIUM
Magnesium: 2 mg/dL (ref 1.7–2.4)
Magnesium: 2 mg/dL (ref 1.7–2.4)

## 2019-12-27 LAB — HIV ANTIBODY (ROUTINE TESTING W REFLEX): HIV Screen 4th Generation wRfx: NONREACTIVE

## 2019-12-27 LAB — SARS CORONAVIRUS 2 BY RT PCR (HOSPITAL ORDER, PERFORMED IN ~~LOC~~ HOSPITAL LAB): SARS Coronavirus 2: NEGATIVE

## 2019-12-27 IMAGING — XA IR PERCUTANEOUS ART THORMBECTOMY/INFUSION INTRACRANIAL INCLUDE D
9 of 11 series · 10 of 24 positions shown · IV contrast (IODINE)
Comparison: CT IMAGING SAME DAY

INDICATION: 53-year-old female with a history of acute left MCA stroke, EVANGELISTA JUAN,
presenting for mechanical thrombectomy

EXAM:
ULTRASOUND GUIDED ACCESS RIGHT COMMON FEMORAL ARTERY
CERVICAL AND CEREBRAL ANGIOGRAM
MECHANICAL THROMBECTOMY LEFT ICA
ANGIO-SEAL FOR HEMOSTASIS
TECHNIQUE: Informed written consent was obtained from the patient's family
after a thorough discussion of the procedural risks, benefits and
alternatives. Specific risks discussed include: Bleeding, infection,
contrast reaction, kidney injury/failure, need for further
procedure/surgery, arterial injury or dissection, embolization to
new territory, intracranial hemorrhage (10-15% risk), neurologic
deterioration, cardiopulmonary collapse, death. All questions were
addressed. Maximal Sterile Barrier Technique was utilized including
during the procedure including caps, mask, sterile gowns, sterile
gloves, sterile drape, hand hygiene and skin antiseptic. A timeout
was performed prior to the initiation of the procedure.
The anesthesia team was present to provide general endotracheal tube
anesthesia and for patient monitoring during the procedure.
Interventional neuro radiology nursing staff was also present.

[Series 1: cerebral · 2 acquisitions, 1 frame shown (1 of 5)]
[im 1/2]
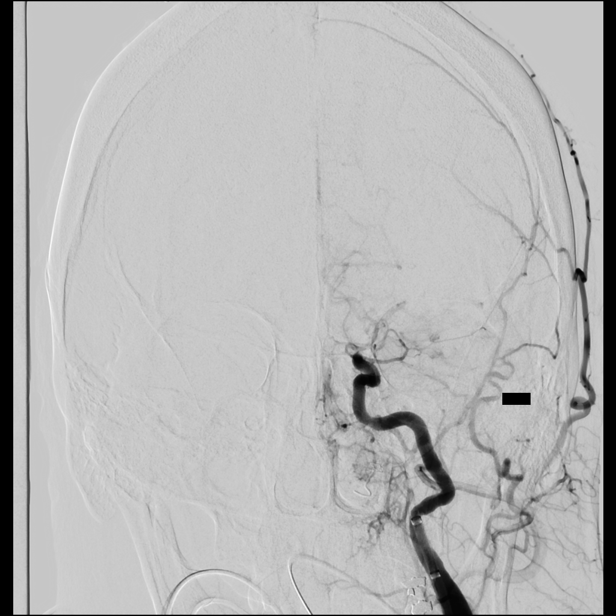

[Series 2: cerebral · 2 acquisitions, 1 frame shown (2 of 5)]
[im 1/2]
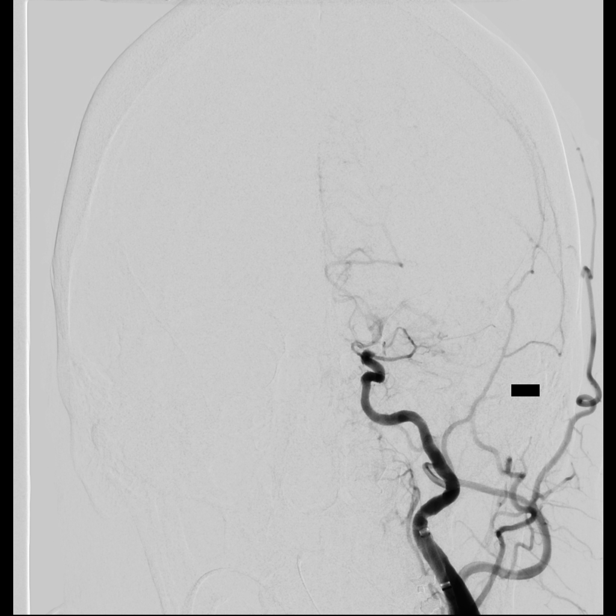

[Series 3: d roadmap · 2 acquisitions, 1 frame shown]
[im 1/2]
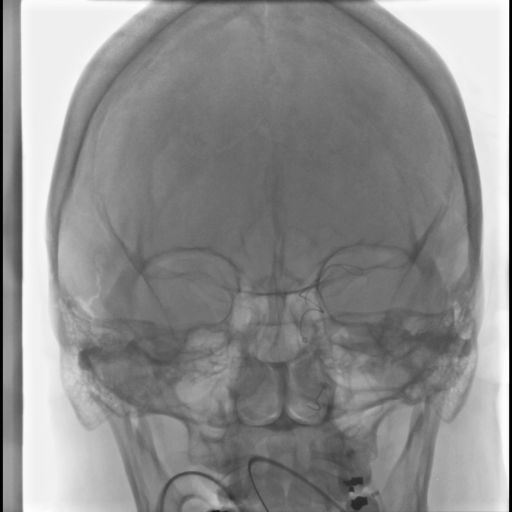

[Series 4: cerebral · 2 acquisitions, 1 frame shown (3 of 5)]
[im 1/2]
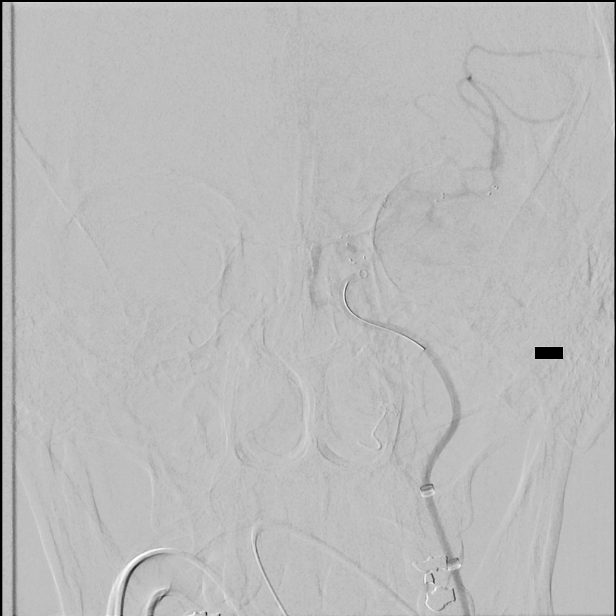

[Series 5: cerebral · 2 acquisitions, 1 frame shown (4 of 5)]
[im 1/2]
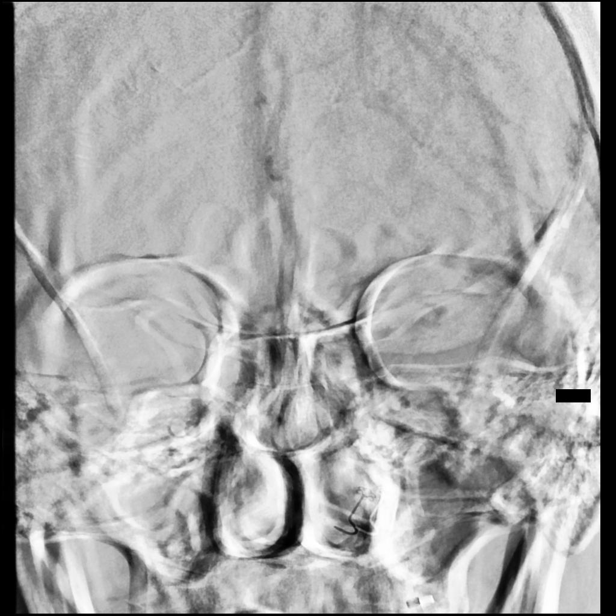

[Series 7: cerebral · 2 acquisitions, 1 frame shown (5 of 5)]
[im 1/2]
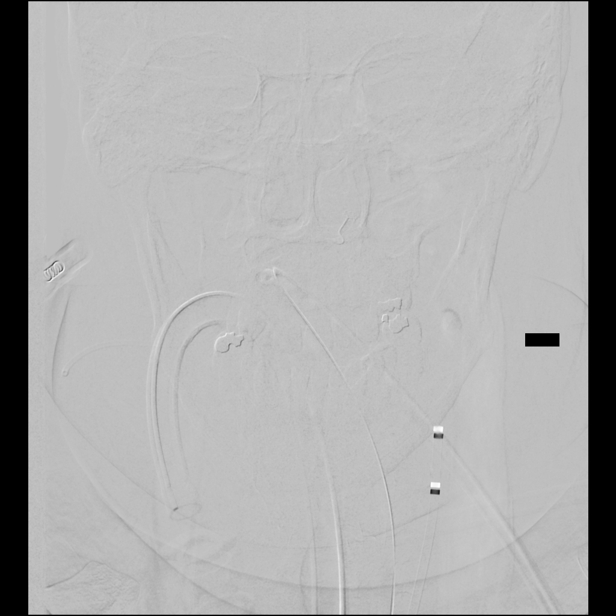

[Series 8: <mpr range>2 · axial · 2.0mm · 0.32mm/px · 1 of 30 slices shown]
[im 1/30]
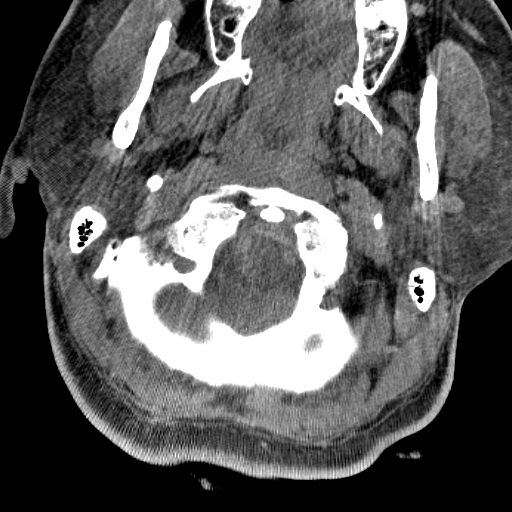

[Series 8: <mpr range>1 · axial · 2.0mm · 0.39mm/px · z∈[-143,-45]mm · 2 of 36 slices shown]
[im 1/36]
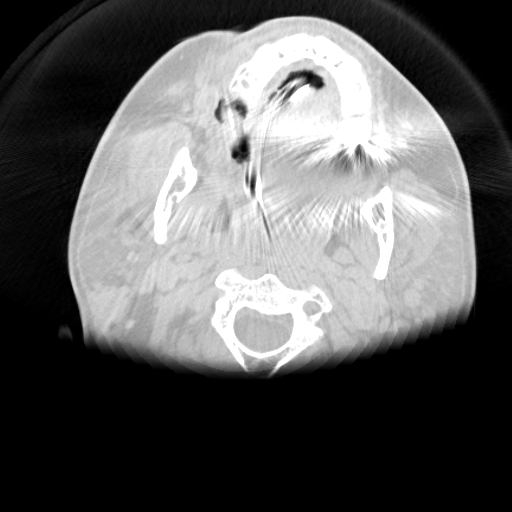
[im 21/36]
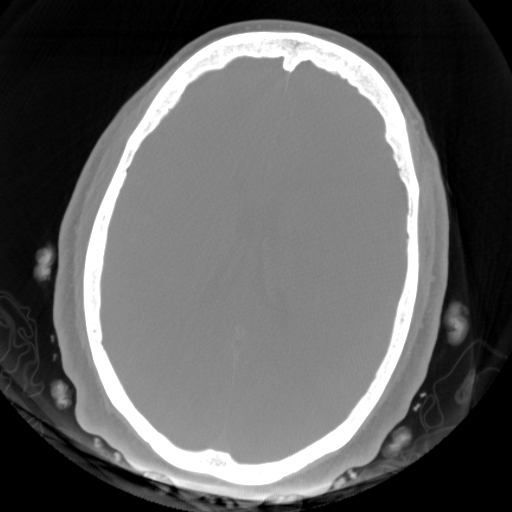

[Series 300: dr. (person_name) · 1 of 17 slices shown]
[im 1/17]
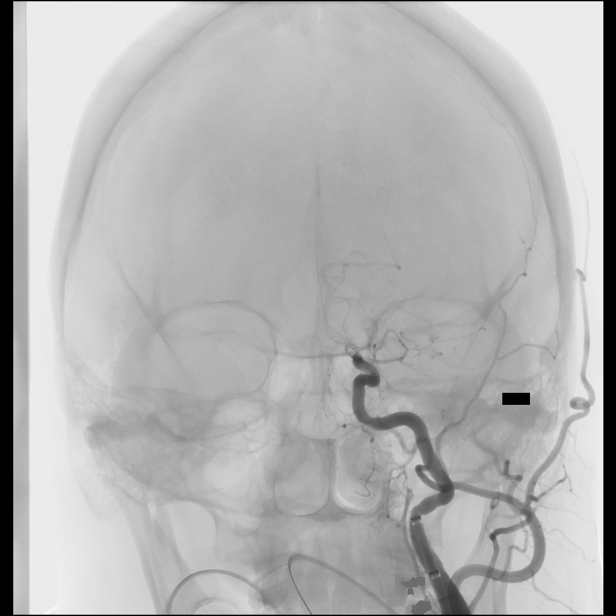

[10 of 24 positions shown; findings below may reference images not displayed]

MEDICATIONS:
NONE

ANESTHESIA/SEDATION:
The anesthesia team was present to provide general endotracheal tube
anesthesia and for patient monitoring during the procedure.
Intubation was performed in Neuro biplane room. Radial arterial line
was performed by the anesthesia team. interventional neuro radiology
nursing staff was also present.

CONTRAST:  50 cc Omnipaque 300

FLUOROSCOPY TIME:  Fluoroscopy Time: 8 minutes 48 seconds (901 mGy).

COMPLICATIONS:
None
FINDINGS: Initial Findings:

Left internal carotid artery: Initial angiogram demonstrates ICA
terminus occlusion, without significant plaque of the petrous
segment, last seroma segment, cavernous segment or the proximal
supraclinoid segment. Ophthalmic artery is patent, with choroid
blush. Patent posterior communicating artery with flow into the left
PCA.

Left MCA: Initial imaging demonstrates no significant primary
filling of the MCA. There are leptomeningeal collateral flows
partially filling the superior division of the MCA territory.
Anterior choroidal artery remains patent with some
anterior-posterior flow through the choroidal system into the
parietal territory.

Left ACA: Initial imaging demonstrates no significant primary
filling of the ACA territory.

Completion Findings:

Left MCA: After 2 passes there is complete restoration of flow
through the ICA terminus, incomplete restoration of flow through the
right MCA branches.

Left ACA: There is restoration of flow into the A1 segment. The
right ACA territory is partially served by left-sided ACA branches
in the pericallosal distribution.

TICI 3 flow restored

Flat panel CT demonstrates no subarachnoid hemorrhage or significant
contrast staining.

PROCEDURE:
The anesthesia team was present to provide general endotracheal tube
anesthesia and for patient monitoring during the procedure.
Intubation was performed in negative pressure [place] in neuro IR
holding. Interventional neuro radiology nursing staff was also
present.

Ultrasound survey of the right inguinal region was performed with
images stored and sent to PACs.

11 blade scalpel was used to make a small incision. Blunt dissection
was performed with US guidance. A micropuncture needle was used
access the right common femoral artery under ultrasound. With
excellent arterial blood flow returned, an .018 micro wire was
passed through the needle, observed to enter the abdominal aorta
under fluoroscopy. The needle was removed, and a micropuncture
sheath was placed over the wire. The inner dilator and wire were
removed, and an 035 wire was advanced under fluoroscopy into the
abdominal aorta. The sheath was removed and a 25cm 8F straight
vascular sheath was placed. The dilator was removed and the sheath
was flushed. Sheath was attached to pressurized and heparinized
saline bag for constant forward flow.

A coaxial system was then advanced over the 035 wire. This included
a 95cm 087 "Walrus" balloon guide with coaxial 125cm EVANGELISTA JUAN
diagnostic catheter. This was advanced to the proximal descending
thoracic aorta. Wire was then removed. Double flush of the catheter
was performed. Catheter was then used to select the left common
carotid artery. Angiogram was performed. The catheter and balloon
guide combination was advanced over a standard glide wire into left
cervical ICA, with distal position achieved of the balloon guide.
The diagnostic catheter and the wire were removed.

Formal angiogram was performed. Road map function was used once the
occluded vessel was identified.

Copious back flush was performed and the balloon catheter was
attached to heparinized and pressurized saline bag for forward flow.

A second coaxial system was then advanced through the balloon
catheter, which included the selected intermediate catheter,
microcatheter, and microwire. In this scenario, the set up included
a 71 zoom catheter, a Trevo [6L] microcatheter, and 014 synchro
soft wire. This system was advanced through the balloon guide
catheter under the road-map function, with adequate back-flush at
the rotating hemostatic valve at that back end of the balloon guide.

Microcatheter and the intermediate catheter system were advanced
through the siphon to the level of the occlusion. The microwire was
passed through the occlusion into the proximal MCA. Aspiration
catheter was then advanced through the ophthalmic segment.
Microcatheter and the microwire were removed. The aspiration
catheter was attached to the proprietary engine for aspiration
confirming adequate backflow. Catheter was then advanced to the site
of the occlusion, confirming stasis of backflow.

1 minutes time frame was observed. The catheter was then withdrawn
into the ophthalmic segment under fluoroscopy. No significant flow
was observed. The balloon on the balloon guide was gently inflated
under fluoroscopy. Aspiration catheter was removed from the system
while aspiration was performed at the balloon guide hub. The
aspiration catheter was completely removed, adequate blood return
was confirmed through the balloon guide, and then the balloon on the
balloon guide was deflated.

Repeat control angiogram was performed confirming continued
occlusion at the ICA terminus.

We elected to perform a 2nd pass.

Coaxial system was again advanced through the balloon catheter,
which included the selected intermediate catheter, microcatheter,
and microwire. In this scenario, the set up included a CAT 6, a
Trevo [6L] microcatheter, and 014 synchro soft wire. This system
was advanced through the balloon guide catheter under the road-map
function, with adequate back-flush at the rotating hemostatic valve
at that back end of the balloon guide.

Blood was then aspirated through the hub of the microcatheter, and a
gentle contrast injection was performed confirming intraluminal
position.

A rotating hemostatic valve was then attached to the back end of the
microcatheter, and a pressurized and heparinized saline bag was
attached to the catheter.

4 x 40 solitaire device was then selected. Back flush was achieved
at the rotating hemostatic valve, and then the device was gently
advanced through the microcatheter to the distal end. The retriever
was then unsheathed by withdrawing the microcatheter under
fluoroscopy. Once the retriever was completely unsheathed, the
microcatheter was carefully stripped from the delivery device.
Control angiogram was performed from the intermediate catheter.

A 3 minute time interval was observed.

The balloon at the balloon guide catheter was then inflated under
fluoroscopy for proximal flow arrest. Constant aspiration using the
proprietary engine was then performed at the intermediate catheter,
as the retriever was gently and slowly withdrawn with fluoroscopic
observation. Once the retriever was "corked" within the tip of the
intermediate catheter, both were removed from the system. Free
aspiration was confirmed at the hub of the balloon guide catheter,
with free blood return confirmed. The balloon was then deflated, and
a control angiogram was performed.

Restoration of flow was confirmed.

Angiogram of the cervical ICA was performed.

Balloon guide was withdrawn into the carotid artery and repeat
angiogram was performed.

Balloon guide was then removed.

The skin at the puncture site was then cleaned with Chlorhexidine.
The 8 French sheath was removed and an 8F angioseal was deployed.

Flat panel CT was performed.

Patient was extubated once the CT was reviewed.

Patient tolerated the procedure well and remained hemodynamically
stable throughout.

No complications were encountered and no significant blood loss
encountered.
IMPRESSION: Status post ultrasound guided access right common femoral artery for
left-sided cervical/cerebral angiogram and mechanical thrombectomy
of ICA terminus occlusion restoring complete flow (TICI 3) into the
ACA territory and MCA territory of the left hemisphere.

Angio-Seal deployed for hemostasis.

PLAN:
The patient will remain intubated

ICU status

Target systolic blood pressure of 120-140

Right hip straight time 6 hours

Frequent neurovascular checks

Repeat neurologic imaging with CT and/MRI at the discretion of
neurology team

## 2019-12-27 IMAGING — DX DG CHEST 1V PORT
1 series · 1 of 1 positions shown · non-contrast
Comparison: [DATE]

CLINICAL DATA: Endotracheal tube placement

EXAM:
PORTABLE CHEST 1 VIEW

[chest]
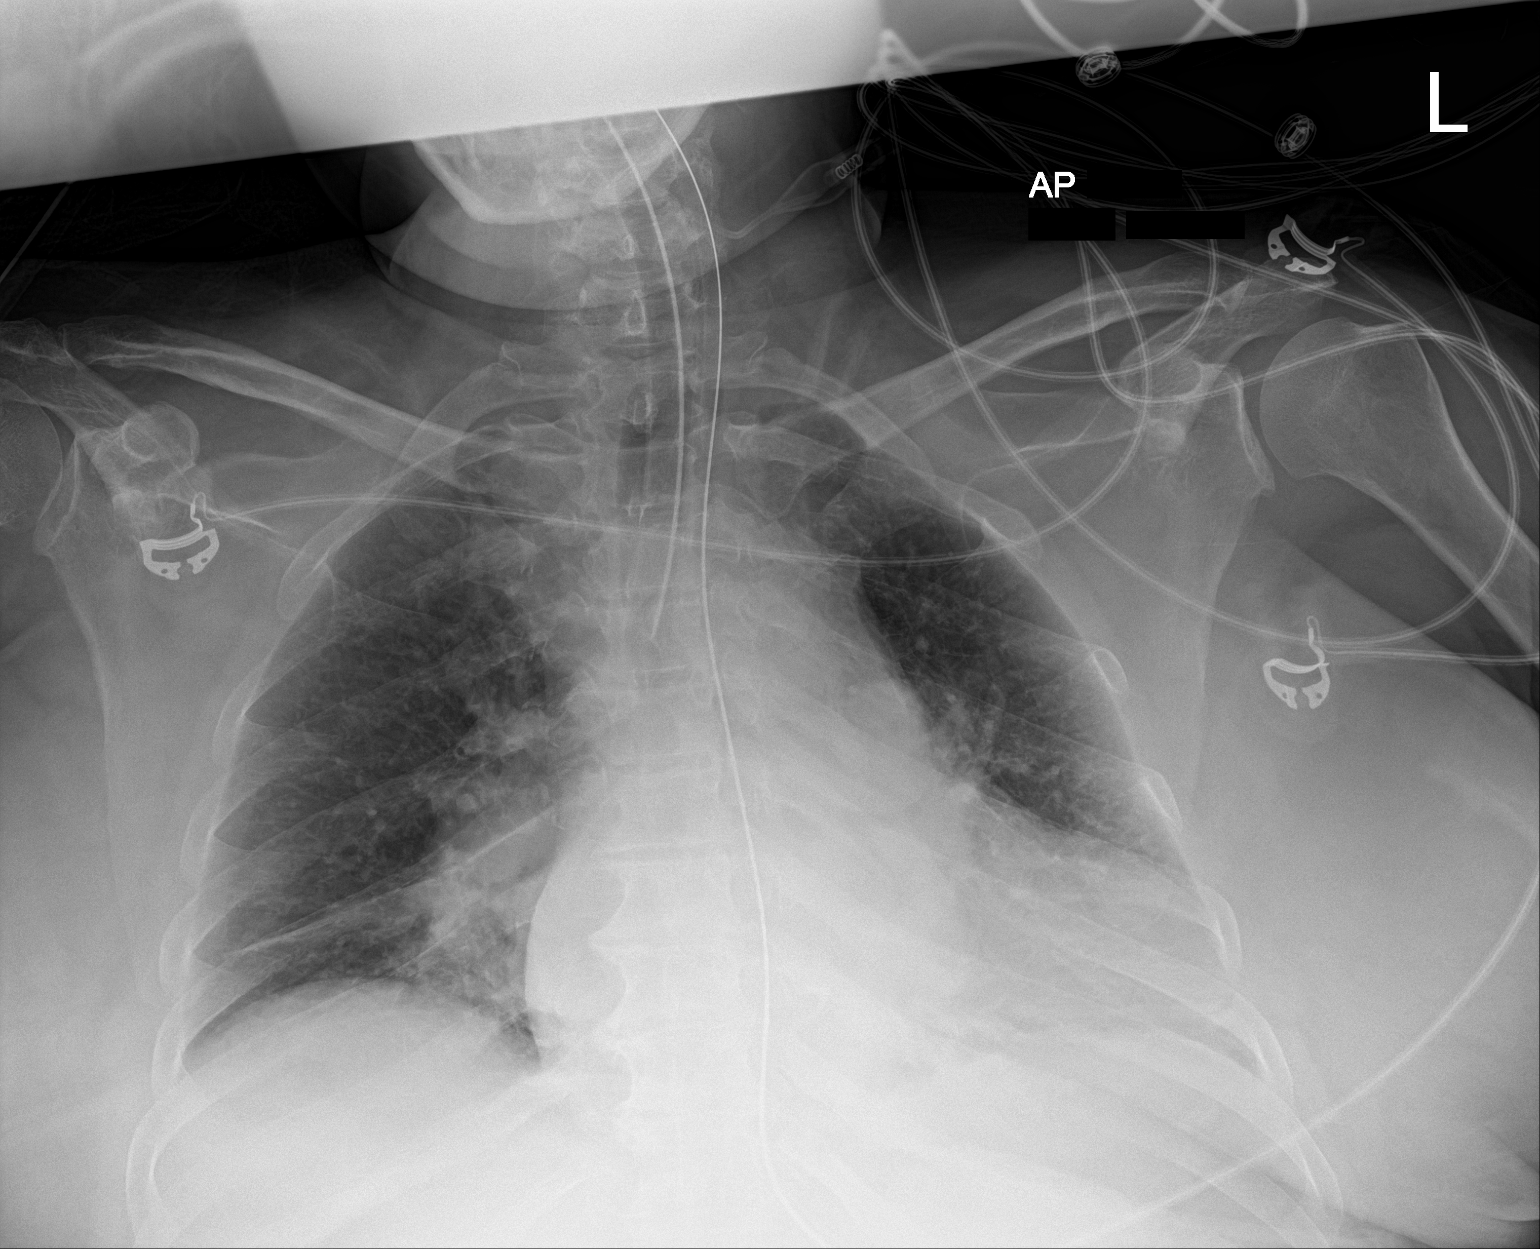

[1 of 1 positions shown; findings below may reference images not displayed]

FINDINGS: The endotracheal tube terminates above the carina by approximately
2.9 cm. The enteric tube extends below the left hemidiaphragm. There
is postoperative atelectasis at the lung bases. There is no
pneumothorax. The heart size is stable but enlarged. There is no
acute osseous abnormality.
IMPRESSION: 1. Lines and tubes as above.
2. Postoperative atelectasis at the lung bases.
3. Cardiomegaly.

## 2019-12-27 IMAGING — MR MR HEAD W/O CM
6 of 10 series · 28 of 48 positions shown · non-contrast
Comparison: Head CT [DATE]

CLINICAL DATA: Stroke follow-up; status post left MCA thrombectomy

EXAM:
MRI HEAD WITHOUT CONTRAST
TECHNIQUE: Multiplanar, multiecho pulse sequences of the brain and surrounding
structures were obtained without intravenous contrast.

[Series 2: DWI · axial · 3.0mm · 0.94mm/px · z∈[-17,+128]mm · 8 of 100 slices shown (1 of 2)]
[im 1/100]
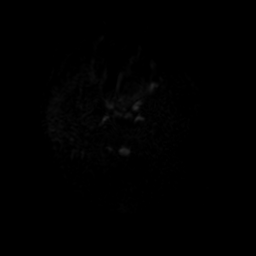
[im 12/100]
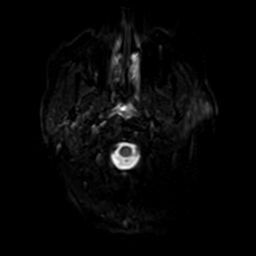
[im 34/100]
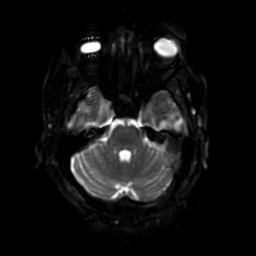
[im 45/100]
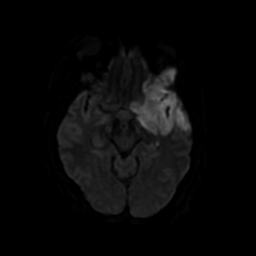
[im 56/100]
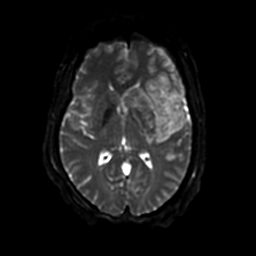
[im 67/100]
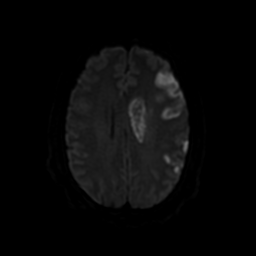
[im 89/100]
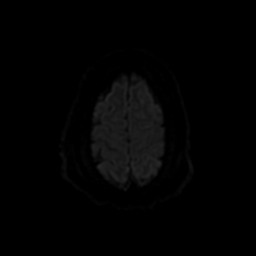
[im 100/100]
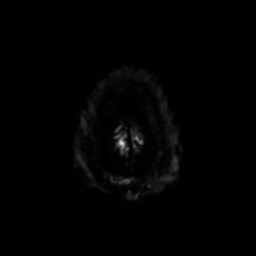

[Series 3: DWI · coronal · 4.0mm · 0.94mm/px · 8 of 74 slices shown (2 of 2)]
[im 1/74]
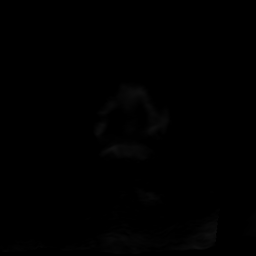
[im 11/74]
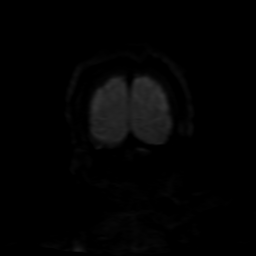
[im 21/74]
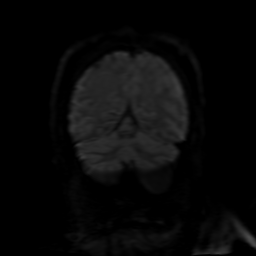
[im 32/74]
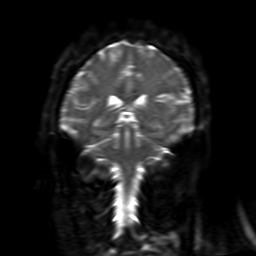
[im 42/74]
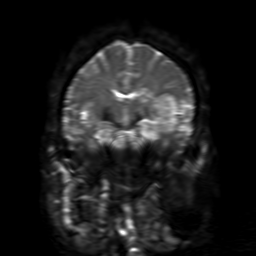
[im 53/74]
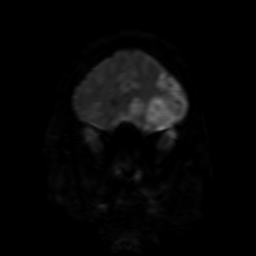
[im 63/74]
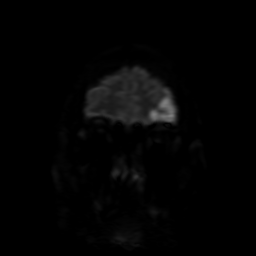
[im 74/74]
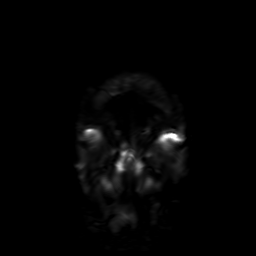

[Series 4: FLAIR · sagittal · 5.0mm · 0.23mm/px · 2 of 23 slices shown (1 of 2)]
[im 1/23]
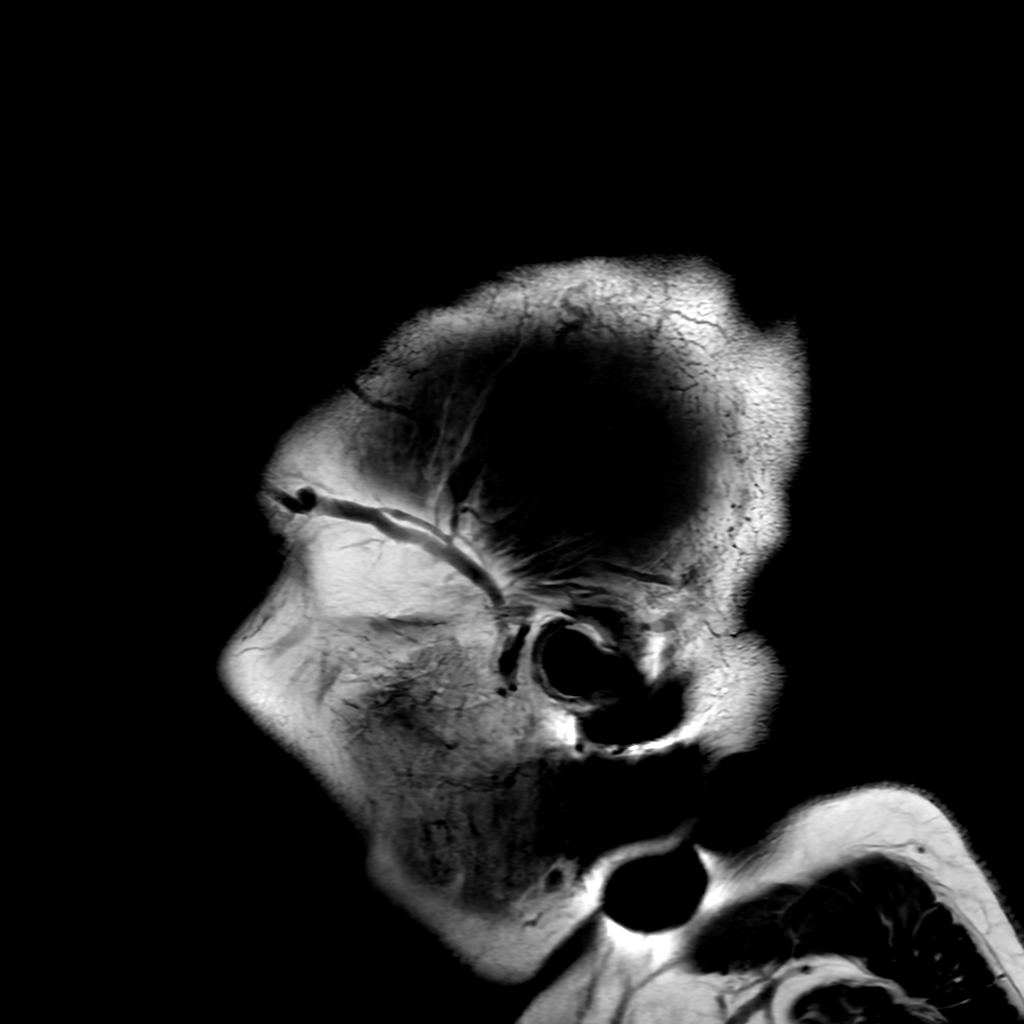
[im 23/23]
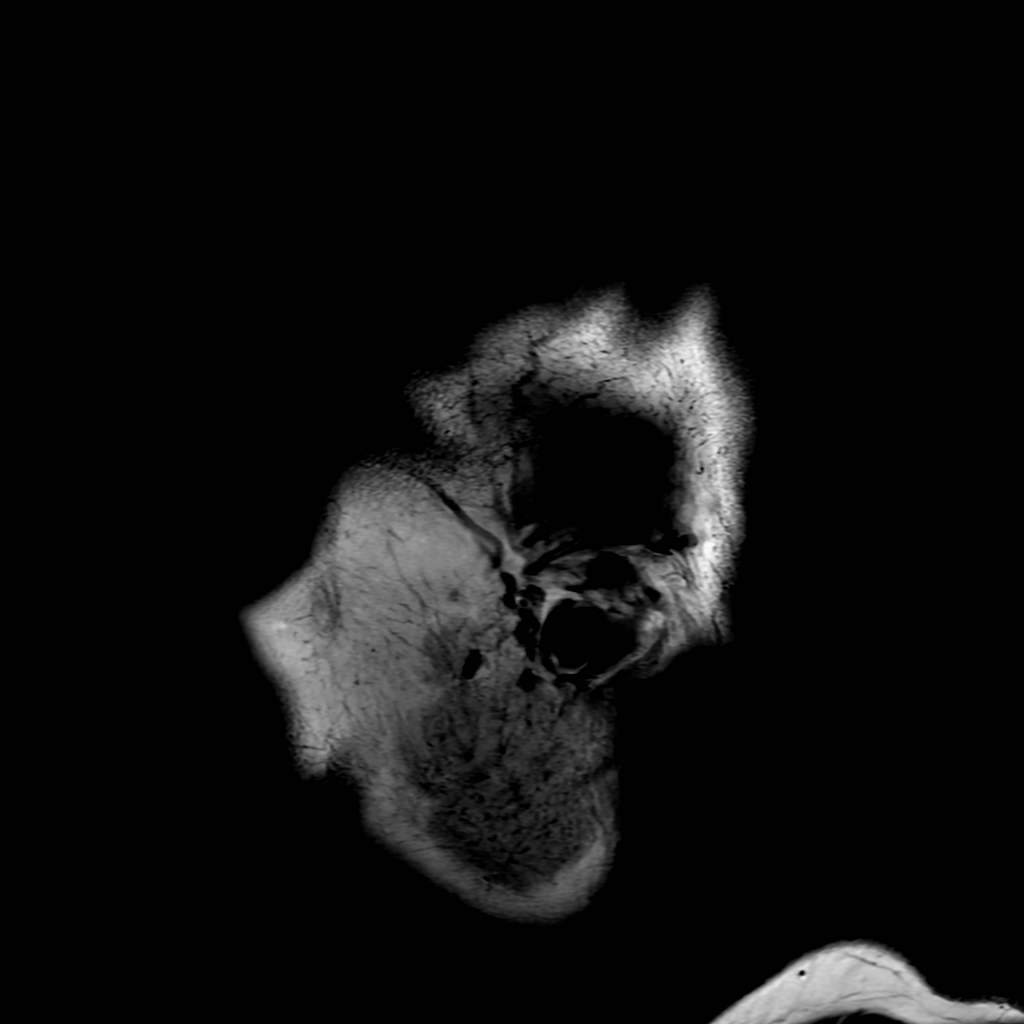

[Series 6: FLAIR · axial · 3.0mm · 0.47mm/px · z∈[-19,+129]mm · 2 of 26 slices shown (2 of 2)]
[im 1/26]
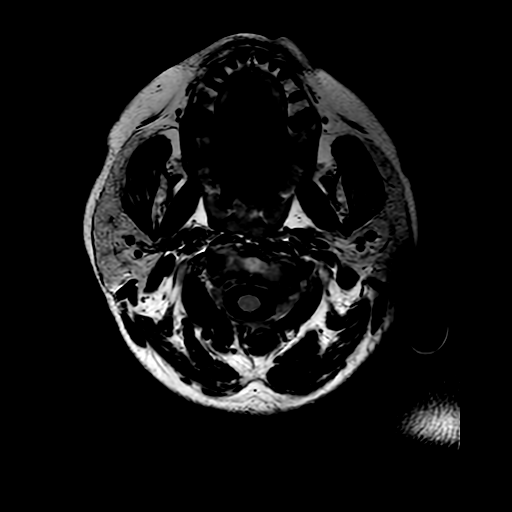
[im 26/26]
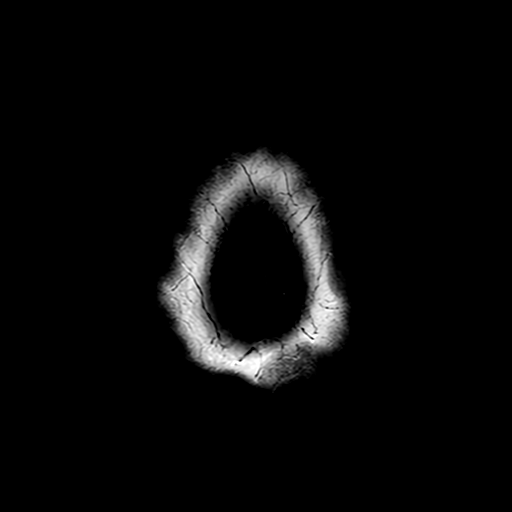

[Series 250: ADC · axial · 3.0mm · 0.94mm/px · z∈[-17,+128]mm · 5 of 50 slices shown (1 of 2)]
[im 1/50]
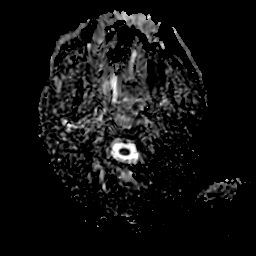
[im 13/50]
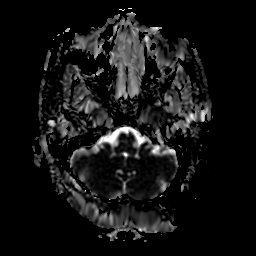
[im 25/50]
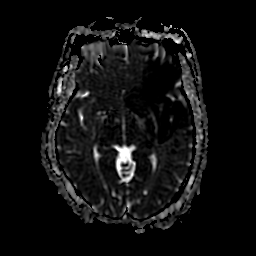
[im 37/50]
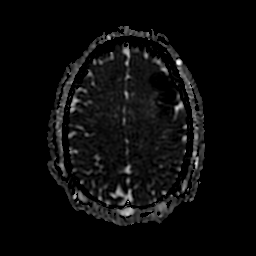
[im 50/50]
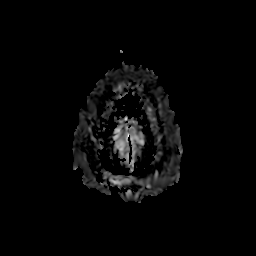

[Series 350: ADC · coronal · 4.0mm · 0.94mm/px · 3 of 37 slices shown (2 of 2)]
[im 1/37]
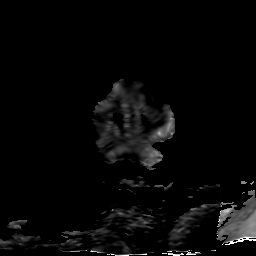
[im 19/37]
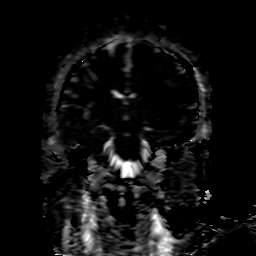
[im 37/37]
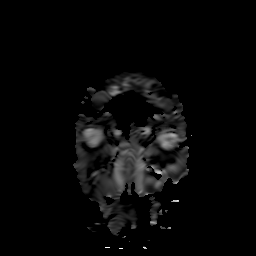

[28 of 48 positions shown; findings below may reference images not displayed]

FINDINGS: Brain: Large area of restricted diffusion involving the left basal
ganglia, insula, anterior temporal lobe including amygdala, inferior
frontal lobe with cortical restricted diffusion in the lateral
frontal and parietal regions, consistent with evolving left MCA
territory infarct. There is susceptibility artifact along the cortex
and basal ganglia, consistent with hemorrhagic transformation. There
is mass effect with effacement of the regional sulci and partial
effacement of the left lateral ventricle with a 6 mm rightward
midline shift. There is no hydrocephalus. Remainder of the brain
parenchyma is maintained.

Vascular: Normal flow voids noting a 3 mm left MCA bifurcation
aneurysm and a 7 mm paraclinoid right ICA aneurysm.

Skull and upper cervical spine: Normal marrow signal.

Sinuses/Orbits: Mild mucosal thickening of the ethmoid cells. The
orbits are maintained.

Other: Endotracheal tube noted.
IMPRESSION: 1. Evolving left MCA territory infarct with hemorrhagic
transformation. Mass effect with 6 mm rightward midline shift.
2. Normal flow voids noting a 3 mm left MCA bifurcation aneurysm and
a 7 mm paraclinoid right ICA aneurysm.

## 2019-12-27 SURGERY — IR WITH ANESTHESIA
Anesthesia: General

## 2019-12-27 MED ORDER — GABAPENTIN 250 MG/5ML PO SOLN
100.0000 mg | Freq: Three times a day (TID) | ORAL | Status: DC
  Administered 2019-12-27 – 2020-01-05 (×28): 100 mg
  Filled 2019-12-27 (×38): qty 2

## 2019-12-27 MED ORDER — PROPOFOL 10 MG/ML IV BOLUS
INTRAVENOUS | Status: DC | PRN
  Administered 2019-12-27: 120 mg via INTRAVENOUS

## 2019-12-27 MED ORDER — ACETAMINOPHEN 650 MG RE SUPP: 650.0000 mg | RECTAL | Status: DC | PRN

## 2019-12-27 MED ORDER — TIZANIDINE HCL 4 MG PO TABS
2.0000 mg | ORAL_TABLET | Freq: Two times a day (BID) | ORAL | Status: DC | PRN
  Administered 2020-01-01: 2 mg
  Filled 2019-12-27: qty 1

## 2019-12-27 MED ORDER — SODIUM CHLORIDE 0.9% FLUSH
10.0000 mL | INTRAVENOUS | Status: DC | PRN
  Administered 2020-01-04: 10 mL

## 2019-12-27 MED ORDER — DIGOXIN 125 MCG PO TABS
0.1250 mg | ORAL_TABLET | Freq: Every day | ORAL | Status: DC
  Administered 2019-12-27 – 2020-01-05 (×10): 0.125 mg
  Filled 2019-12-27 (×9): qty 1

## 2019-12-27 MED ORDER — TORSEMIDE 20 MG PO TABS
20.0000 mg | ORAL_TABLET | Freq: Two times a day (BID) | ORAL | Status: DC
  Filled 2019-12-27: qty 1

## 2019-12-27 MED ORDER — ACETAMINOPHEN 325 MG PO TABS: 650.0000 mg | ORAL_TABLET | ORAL | Status: DC | PRN

## 2019-12-27 MED ORDER — DILTIAZEM LOAD VIA INFUSION
INTRAVENOUS | Status: DC | PRN
  Administered 2019-12-27: 5 mg via INTRAVENOUS

## 2019-12-27 MED ORDER — CARVEDILOL 12.5 MG PO TABS
50.0000 mg | ORAL_TABLET | Freq: Two times a day (BID) | ORAL | Status: DC
  Administered 2019-12-27 – 2020-01-05 (×19): 50 mg
  Filled 2019-12-27 (×3): qty 4
  Filled 2019-12-27: qty 2
  Filled 2019-12-27: qty 4
  Filled 2019-12-27 (×3): qty 2
  Filled 2019-12-27 (×5): qty 4
  Filled 2019-12-27 (×2): qty 2
  Filled 2019-12-27: qty 8
  Filled 2019-12-27: qty 2
  Filled 2019-12-27 (×4): qty 4
  Filled 2019-12-27: qty 2
  Filled 2019-12-27: qty 4
  Filled 2019-12-27 (×2): qty 2
  Filled 2019-12-27: qty 4

## 2019-12-27 MED ORDER — DILTIAZEM HCL-DEXTROSE 125-5 MG/125ML-% IV SOLN (PREMIX)
5.0000 mg/h | INTRAVENOUS | Status: DC
  Administered 2019-12-27: 5 mg/h via INTRAVENOUS
  Filled 2019-12-27 (×3): qty 125

## 2019-12-27 MED ORDER — METOPROLOL TARTRATE 5 MG/5ML IV SOLN
5.0000 mg | Freq: Once | INTRAVENOUS | Status: AC
  Administered 2019-12-27: 5 mg via INTRAVENOUS
  Filled 2019-12-27: qty 5

## 2019-12-27 MED ORDER — TIZANIDINE HCL 2 MG PO TABS: 2.0000 mg | ORAL_TABLET | Freq: Two times a day (BID) | ORAL | Status: DC | PRN

## 2019-12-27 MED ORDER — IOHEXOL 300 MG/ML  SOLN
150.0000 mL | Freq: Once | INTRAMUSCULAR | Status: AC | PRN
  Administered 2019-12-27: 40 mL via INTRA_ARTERIAL

## 2019-12-27 MED ORDER — PRO-STAT SUGAR FREE PO LIQD
30.0000 mL | Freq: Two times a day (BID) | ORAL | Status: DC
  Administered 2019-12-27: 30 mL
  Filled 2019-12-27: qty 30

## 2019-12-27 MED ORDER — IOHEXOL 300 MG/ML  SOLN
50.0000 mL | Freq: Once | INTRAMUSCULAR | Status: AC | PRN
  Administered 2019-12-27: 10 mL via INTRA_ARTERIAL

## 2019-12-27 MED ORDER — FENTANYL CITRATE (PF) 100 MCG/2ML IJ SOLN
INTRAMUSCULAR | Status: DC | PRN
  Administered 2019-12-27 (×2): 50 ug via INTRAVENOUS

## 2019-12-27 MED ORDER — PHENYLEPHRINE HCL-NACL 10-0.9 MG/250ML-% IV SOLN
INTRAVENOUS | Status: DC | PRN
  Administered 2019-12-27: 50 ug/min via INTRAVENOUS

## 2019-12-27 MED ORDER — INSULIN ASPART 100 UNIT/ML ~~LOC~~ SOLN
0.0000 [IU] | SUBCUTANEOUS | Status: DC
  Administered 2019-12-27 (×2): 11 [IU] via SUBCUTANEOUS
  Administered 2019-12-27: 7 [IU] via SUBCUTANEOUS
  Administered 2019-12-27: 4 [IU] via SUBCUTANEOUS
  Administered 2019-12-27: 7 [IU] via SUBCUTANEOUS
  Administered 2019-12-28: 4 [IU] via SUBCUTANEOUS
  Administered 2019-12-28: 7 [IU] via SUBCUTANEOUS
  Administered 2019-12-28: 11 [IU] via SUBCUTANEOUS
  Administered 2019-12-28: 7 [IU] via SUBCUTANEOUS
  Administered 2019-12-28: 3 [IU] via SUBCUTANEOUS
  Administered 2019-12-28: 11 [IU] via SUBCUTANEOUS
  Administered 2019-12-28 – 2019-12-29 (×2): 4 [IU] via SUBCUTANEOUS
  Administered 2019-12-29 (×3): 11 [IU] via SUBCUTANEOUS
  Administered 2019-12-29 (×2): 7 [IU] via SUBCUTANEOUS
  Administered 2019-12-30: 4 [IU] via SUBCUTANEOUS

## 2019-12-27 MED ORDER — LABETALOL HCL 5 MG/ML IV SOLN
10.0000 mg | INTRAVENOUS | Status: DC | PRN
  Administered 2019-12-27: 20 mg via INTRAVENOUS
  Administered 2019-12-28: 40 mg via INTRAVENOUS
  Administered 2019-12-28: 20 mg via INTRAVENOUS
  Administered 2019-12-28: 10 mg via INTRAVENOUS
  Administered 2019-12-28: 40 mg via INTRAVENOUS
  Administered 2019-12-28 (×2): 20 mg via INTRAVENOUS
  Administered 2019-12-28 – 2019-12-29 (×3): 40 mg via INTRAVENOUS
  Filled 2019-12-27: qty 8
  Filled 2019-12-27 (×2): qty 4
  Filled 2019-12-27 (×2): qty 8
  Filled 2019-12-27: qty 4
  Filled 2019-12-27 (×3): qty 8

## 2019-12-27 MED ORDER — FENTANYL CITRATE (PF) 100 MCG/2ML IJ SOLN: 50.0000 ug | Freq: Once | INTRAMUSCULAR | Status: DC

## 2019-12-27 MED ORDER — PROPOFOL 1000 MG/100ML IV EMUL
5.0000 ug/kg/min | INTRAVENOUS | Status: DC
  Administered 2019-12-27 (×2): 80 ug/kg/min via INTRAVENOUS
  Filled 2019-12-27 (×3): qty 100

## 2019-12-27 MED ORDER — GABAPENTIN 100 MG PO CAPS: 100.0000 mg | ORAL_CAPSULE | Freq: Three times a day (TID) | ORAL | Status: DC

## 2019-12-27 MED ORDER — PANTOPRAZOLE SODIUM 40 MG IV SOLR
40.0000 mg | Freq: Every day | INTRAVENOUS | Status: DC
  Administered 2019-12-27: 40 mg via INTRAVENOUS
  Filled 2019-12-27 (×2): qty 40

## 2019-12-27 MED ORDER — PRO-STAT SUGAR FREE PO LIQD
30.0000 mL | Freq: Three times a day (TID) | ORAL | Status: DC
  Administered 2019-12-27 – 2019-12-30 (×9): 30 mL
  Filled 2019-12-27 (×9): qty 30

## 2019-12-27 MED ORDER — DOCUSATE SODIUM 50 MG/5ML PO LIQD: 100.0000 mg | Freq: Two times a day (BID) | ORAL | Status: DC

## 2019-12-27 MED ORDER — FENTANYL 2500MCG IN NS 250ML (10MCG/ML) PREMIX INFUSION
50.0000 ug/h | INTRAVENOUS | Status: DC
  Administered 2019-12-27 (×2): 50 ug/h via INTRAVENOUS
  Filled 2019-12-27: qty 250

## 2019-12-27 MED ORDER — SENNOSIDES-DOCUSATE SODIUM 8.6-50 MG PO TABS
1.0000 | ORAL_TABLET | Freq: Every evening | ORAL | Status: DC | PRN
  Administered 2020-01-01: 1
  Filled 2019-12-27: qty 1

## 2019-12-27 MED ORDER — POTASSIUM CHLORIDE CRYS ER 20 MEQ PO TBCR: 20.0000 meq | EXTENDED_RELEASE_TABLET | Freq: Two times a day (BID) | ORAL | Status: DC

## 2019-12-27 MED ORDER — ORAL CARE MOUTH RINSE
15.0000 mL | OROMUCOSAL | Status: DC
  Administered 2019-12-27 – 2019-12-30 (×31): 15 mL via OROMUCOSAL

## 2019-12-27 MED ORDER — PROPOFOL 500 MG/50ML IV EMUL
INTRAVENOUS | Status: DC | PRN
Start: 2019-12-27 — End: 2019-12-27
  Administered 2019-12-27: 50 ug/kg/min via INTRAVENOUS

## 2019-12-27 MED ORDER — STROKE: EARLY STAGES OF RECOVERY BOOK
Freq: Once | Status: DC
  Filled 2019-12-27 (×2): qty 1

## 2019-12-27 MED ORDER — POLYETHYLENE GLYCOL 3350 17 G PO PACK
17.0000 g | PACK | Freq: Every day | ORAL | Status: DC
  Administered 2019-12-27 – 2019-12-30 (×2): 17 g
  Filled 2019-12-27 (×3): qty 1

## 2019-12-27 MED ORDER — CHLORHEXIDINE GLUCONATE CLOTH 2 % EX PADS
6.0000 | MEDICATED_PAD | Freq: Every day | CUTANEOUS | Status: DC
  Administered 2019-12-28 – 2019-12-30 (×4): 6 via TOPICAL

## 2019-12-27 MED ORDER — CLEVIDIPINE BUTYRATE 0.5 MG/ML IV EMUL: 0.0000 mg/h | INTRAVENOUS | Status: DC

## 2019-12-27 MED ORDER — LABETALOL HCL 5 MG/ML IV SOLN
INTRAVENOUS | Status: AC
  Filled 2019-12-27: qty 4

## 2019-12-27 MED ORDER — CARVEDILOL 25 MG PO TABS
50.0000 mg | ORAL_TABLET | Freq: Two times a day (BID) | ORAL | Status: DC
  Filled 2019-12-27: qty 2

## 2019-12-27 MED ORDER — SUCCINYLCHOLINE CHLORIDE 20 MG/ML IJ SOLN
INTRAMUSCULAR | Status: DC | PRN
Start: 2019-12-27 — End: 2019-12-27
  Administered 2019-12-27: 110 mg via INTRAVENOUS

## 2019-12-27 MED ORDER — CHLORHEXIDINE GLUCONATE 0.12% ORAL RINSE (MEDLINE KIT)
15.0000 mL | Freq: Two times a day (BID) | OROMUCOSAL | Status: DC
  Administered 2019-12-27 – 2019-12-30 (×7): 15 mL via OROMUCOSAL

## 2019-12-27 MED ORDER — ADULT MULTIVITAMIN W/MINERALS CH
1.0000 | ORAL_TABLET | Freq: Every day | ORAL | Status: DC
  Administered 2019-12-27 – 2020-01-05 (×10): 1
  Filled 2019-12-27 (×10): qty 1

## 2019-12-27 MED ORDER — INSULIN GLARGINE 100 UNIT/ML ~~LOC~~ SOLN: 5.0000 [IU] | Freq: Every day | SUBCUTANEOUS | Status: DC

## 2019-12-27 MED ORDER — ASPIRIN 81 MG PO CHEW
81.0000 mg | CHEWABLE_TABLET | Freq: Every day | ORAL | Status: DC
  Administered 2019-12-27 – 2020-01-04 (×9): 81 mg
  Filled 2019-12-27 (×9): qty 1

## 2019-12-27 MED ORDER — DOCUSATE SODIUM 50 MG/5ML PO LIQD
100.0000 mg | Freq: Two times a day (BID) | ORAL | Status: DC
  Administered 2019-12-27 – 2019-12-31 (×8): 100 mg
  Filled 2019-12-27 (×8): qty 10

## 2019-12-27 MED ORDER — DIGOXIN 125 MCG PO TABS
0.1250 mg | ORAL_TABLET | Freq: Every day | ORAL | Status: DC
  Filled 2019-12-27: qty 1

## 2019-12-27 MED ORDER — INSULIN GLARGINE 100 UNIT/ML ~~LOC~~ SOLN
10.0000 [IU] | Freq: Every day | SUBCUTANEOUS | Status: DC
  Administered 2019-12-27 – 2019-12-28 (×2): 10 [IU] via SUBCUTANEOUS
  Filled 2019-12-27 (×3): qty 0.1

## 2019-12-27 MED ORDER — IPRATROPIUM-ALBUTEROL 0.5-2.5 (3) MG/3ML IN SOLN
3.0000 mL | Freq: Four times a day (QID) | RESPIRATORY_TRACT | Status: DC
  Administered 2019-12-27 – 2019-12-31 (×15): 3 mL via RESPIRATORY_TRACT
  Filled 2019-12-27 (×14): qty 3

## 2019-12-27 MED ORDER — TORSEMIDE 20 MG PO TABS
20.0000 mg | ORAL_TABLET | Freq: Two times a day (BID) | ORAL | Status: DC
  Administered 2019-12-27 – 2019-12-28 (×3): 20 mg
  Filled 2019-12-27 (×2): qty 1

## 2019-12-27 MED ORDER — SODIUM CHLORIDE 0.9 % IV SOLN: INTRAVENOUS | Status: DC

## 2019-12-27 MED ORDER — POTASSIUM CHLORIDE 20 MEQ/15ML (10%) PO SOLN
20.0000 meq | Freq: Two times a day (BID) | ORAL | Status: DC
  Administered 2019-12-27: 20 meq
  Filled 2019-12-27: qty 15

## 2019-12-27 MED ORDER — VITAL HIGH PROTEIN PO LIQD
1000.0000 mL | ORAL | Status: DC
  Administered 2019-12-27 – 2019-12-30 (×4): 1000 mL

## 2019-12-27 MED ORDER — SODIUM CHLORIDE 0.9% FLUSH
10.0000 mL | Freq: Two times a day (BID) | INTRAVENOUS | Status: DC
  Administered 2019-12-27 (×2): 10 mL
  Administered 2019-12-28: 20 mL
  Administered 2019-12-28 – 2019-12-29 (×2): 10 mL
  Administered 2019-12-29 – 2019-12-30 (×2): 30 mL
  Administered 2019-12-30 – 2019-12-31 (×3): 10 mL
  Administered 2020-01-01: 20 mL
  Administered 2020-01-01 – 2020-01-05 (×8): 10 mL

## 2019-12-27 MED ORDER — SENNOSIDES-DOCUSATE SODIUM 8.6-50 MG PO TABS: 1.0000 | ORAL_TABLET | Freq: Every evening | ORAL | Status: DC | PRN

## 2019-12-27 MED ORDER — FENTANYL BOLUS VIA INFUSION
50.0000 ug | INTRAVENOUS | Status: DC | PRN
  Administered 2019-12-27 – 2019-12-28 (×2): 50 ug via INTRAVENOUS
  Filled 2019-12-27: qty 50

## 2019-12-27 MED ORDER — LIDOCAINE 2% (20 MG/ML) 5 ML SYRINGE
INTRAMUSCULAR | Status: DC | PRN
  Administered 2019-12-27: 40 mg via INTRAVENOUS

## 2019-12-27 MED ORDER — SODIUM CHLORIDE 0.9 % IV SOLN
INTRAVENOUS | Status: DC | PRN
Start: 2019-12-27 — End: 2019-12-27

## 2019-12-27 MED ORDER — ACETAMINOPHEN 160 MG/5ML PO SOLN
650.0000 mg | ORAL | Status: DC | PRN
  Administered 2020-01-01 – 2020-01-04 (×3): 650 mg
  Filled 2019-12-27 (×3): qty 20.3

## 2019-12-27 MED ORDER — POLYETHYLENE GLYCOL 3350 17 G PO PACK: 17.0000 g | PACK | Freq: Every day | ORAL | Status: DC

## 2019-12-27 MED ORDER — ROCURONIUM BROMIDE 10 MG/ML (PF) SYRINGE
PREFILLED_SYRINGE | INTRAVENOUS | Status: DC | PRN
  Administered 2019-12-27: 50 mg via INTRAVENOUS

## 2019-12-27 NOTE — Consult Note (Signed)
NAME:  Robin [name], MRN:  [id number], DOB:  [birth year], LOS: 0 ADMISSION DATE:  12/26/2019, CONSULTATION DATE:  12/27/19 REFERRING MD:  Cheral Marker, CHIEF COMPLAINT:  Acute CVA   Brief History   54 year old female admitted with acute MCA CVA and left ICA LVO, taken emergently for thrombectomy and left intubated post procedure.  PCCM consulted for vent management.  History of present illness   54 year old female with a history of diabetes pretension, A. fib on Eliquis, heart failure, substance abuse who presented after a fall with right-sided weakness as a code stroke.  Found to have left MCA CVA and left ICA occlusion.  Taken emergently for thrombectomy and transferred to the ICU intubated.  PCCM consulted for vent management  Past Medical History   has a past medical history of Allergy, Anemia, Arthritis, CHF (congestive heart failure) ([place]), Chronic abdominal pain, Cocaine abuse ([place]), COPD (chronic obstructive pulmonary disease) ([place]), Essential hypertension, benign, Fatty liver, GERD (gastroesophageal reflux disease), Gout (2016), Normal coronary arteries, Ovarian cyst, and Type 2 diabetes mellitus ([place]).   Significant Hospital Events   5/24 Admit to neurology  Consults:  PCCM  Procedures:  5/24 ETT  Significant Diagnostic Tests:  5/23 CT head>>evolving hypodensity involving the left basal ganglia, left insula, and adjacent left temporal lobe, which could reflect an evolving left MCA territory infarct. Additionally, there is question of asymmetric hyperdensity at the left ICA terminus, which could reflect intraluminal thrombus/LVO  Micro Data:  5/24 Sars-Cov-2>>negative 5/24 UC>>  Antimicrobials:     Interim history/subjective:  Pt arrived to the ICU intubated and sedated, hemodynamically stable  Objective   Blood pressure (!) 186/103, pulse 91, temperature (!) 96.7 F (35.9 C), temperature source Rectal, resp. rate 18, height 5\' 6"  (1.676 m), weight 98.9 kg, last menstrual  period 08/21/2011, SpO2 100 %.    Vent Mode: PRVC FiO2 (%):  [40 %] 40 % Set Rate:  [16 bmp] 16 bmp Vt Set:  [470 mL] 470 mL PEEP:  [5 cmH20] 5 cmH20 Plateau Pressure:  [20 cmH20] 20 cmH20   Intake/Output Summary (Last 24 hours) at 12/27/2019 0315 Last data filed at 12/27/2019 0236 Gross per 24 hour  Intake 500 ml  Output 50 ml  Net 450 ml   Filed Weights   12/26/19 2243  Weight: 98.9 kg    General: Well-nourished female intubated and sedated HEENT: MM pink/moist, ETT Neuro: Right facial droop, right pupil 1 mm and minimally responsive to light, left pupil 3 mm and responsive, does not withdraw to pain  CV: s1s2 irregular rhythm, no m/r/g PULM: CTA B Full vent support GI: soft, bsx4 active  Extremities: warm/dry, no edema  Skin: no rashes or lesions   Resolved Hospital Problem list     Assessment & Plan:   Acute left MCA ischemic CVA with left ICA LVO s/p thrombectomy requiring intubation for procedure Management per neurology, SBP goal 120-130 -TTE and MRI pending -Lipid panel, A1c -Sedation and analgesia with propofol and fentanyl -ABG within normal limits on minimal vent settings --Maintain full vent support with SAT/SBT as tolerated -titrate Vent setting to maintain SpO2 greater than or equal to 90%. -HOB elevated 30 degrees. -Plateau pressures less than 30 cm H20.  -Follow chest x-ray, ABG prn.   -Bronchial hygiene and RT/bronchodilator protocol. -Can possibly be extubated tomorrow   Atrial fibrillation, HTN Eliquis held for 24 hours post procedure -On Cardizem gtt. and high-dose propofol, blood pressure still higher than goal Cardizem was increased and if not reaching goal  SBP then may need to switch to Cleviprex, heart rate 80s -Continue home Coreg    Type 2 diabetes -Metformin, SSI, A1c      Best practice:  Diet: N.p.o. Pain/Anxiety/Delirium protocol (if indicated): Propofol, fentanyl VAP protocol (if indicated): HOB 30 degrees, suction as  needed DVT prophylaxis: SCDs GI prophylaxis: Protonix Glucose control: SSI Mobility: Bedrest Code Status: Full code Family Communication: Per primary Disposition: ICU  Labs   CBC: Recent Labs  Lab 12/26/19 2254  WBC 8.1  NEUTROABS 5.1  HGB 13.5  HCT 42.0  MCV 91.7  PLT [id number]    Basic Metabolic Panel: Recent Labs  Lab 12/26/19 2254  NA 136  K 3.9  CL 104  CO2 21*  GLUCOSE 355*  BUN 15  CREATININE 1.15*  CALCIUM 8.7*   GFR: Estimated Creatinine Clearance: 67.1 mL/min (A) (by C-G formula based on SCr of 1.15 mg/dL (H)). Recent Labs  Lab 12/26/19 2254  WBC 8.1    Liver Function Tests: Recent Labs  Lab 12/26/19 2254  AST 36  ALT 26  ALKPHOS 87  BILITOT 0.5  PROT 7.0  ALBUMIN 3.7   No results for input(s): LIPASE, AMYLASE in the last 168 hours. No results for input(s): AMMONIA in the last 168 hours.  ABG    Component Value Date/Time   PHART 7.519 (H) 07/27/2007 1302   PCO2ART 28.1 (L) 07/27/2007 1302   PO2ART 110.0 (H) 07/27/2007 1302   HCO3 22.8 07/27/2007 1302   TCO2 25 04/19/2017 1747   O2SAT 98.6 07/27/2007 1302     Coagulation Profile: Recent Labs  Lab 12/26/19 2255  INR 1.2    Cardiac Enzymes: No results for input(s): CKTOTAL, CKMB, CKMBINDEX, TROPONINI in the last 168 hours.  HbA1C: Hemoglobin A1C  Date/Time Value Ref Range Status  09/13/2019 07:38 AM 10.3 (A) 4.0 - 5.6 % Final   Hgb A1c MFr Bld  Date/Time Value Ref Range Status  06/28/2019 10:00 AM 11.4 (H) 4.8 - 5.6 % Final    Comment:    (NOTE) Pre diabetes:          5.7%-6.4% Diabetes:              >6.4% Glycemic control for   <7.0% adults with diabetes   05/10/2019 01:22 PM 12.0 (H) 4.8 - 5.6 % Final    Comment:    (NOTE) Pre diabetes:          5.7%-6.4% Diabetes:              >6.4% Glycemic control for   <7.0% adults with diabetes     CBG: Recent Labs  Lab 12/26/19 2247 12/27/19 0302  GLUCAP 319* 299*    Review of Systems:   Unable to obtain secondary  to mental status  Past Medical History  She,  has a past medical history of Allergy, Anemia, Arthritis, CHF (congestive heart failure) ([place]), Chronic abdominal pain, Cocaine abuse ([place]), COPD (chronic obstructive pulmonary disease) ([place]), Essential hypertension, benign, Fatty liver, GERD (gastroesophageal reflux disease), Gout (2016), Normal coronary arteries, Ovarian cyst, and Type 2 diabetes mellitus ([place]).   Surgical History    Past Surgical History:  Procedure Laterality Date  . ABDOMINAL HYSTERECTOMY  09/10/2011   Procedure: HYSTERECTOMY ABDOMINAL;  Surgeon: Jonnie Kind, MD;  Location: [facility];  Service: Gynecology;  Laterality: N/A;  Abdominal hysterectomy  . CESAREAN SECTION  [id number], and 1994  . CHOLECYSTECTOMY  1995  . SCAR REVISION  09/10/2011   Procedure: SCAR REVISION;  Surgeon: Jenny Reichmann  France Ravens, MD;  Location: [facility];  Service: Gynecology;  Laterality: N/A;  Wide Excision of old Cicatrix  . TUBAL LIGATION  1994     Social History   reports that she has quit smoking. Her smoking use included cigarettes. She has a 7.25 pack-year smoking history. She has never used smokeless tobacco. She reports current alcohol use. She reports previous drug use. Drugs: Cocaine and Marijuana.   Family History   Her family history includes Alcohol abuse in her father, mother, and son; Cirrhosis in her mother; Diabetes type II in her father and sister; Early death in her brother and mother. There is no history of Anesthesia problems, Hypotension, Malignant hyperthermia, Pseudochol deficiency, Colon cancer, Colon polyps, Esophageal cancer, Rectal cancer, or Stomach cancer.   Allergies Allergies  Allergen Reactions  . Bee Venom Shortness Of Breath and Swelling    Bodily Swelling  . Losartan Other (See Comments)    Nosebleeds per patient report.   . Naproxen Other (See Comments)    Effects acid reflux  . Penicillins Nausea Only    Has patient had a PCN reaction causing immediate rash,  facial/tongue/throat swelling, SOB or lightheadedness with hypotension: no Has patient had a PCN reaction causing severe rash involving mucus membranes or skin necrosis: no Has patient had a PCN reaction that required hospitalization no Has patient had a PCN reaction occurring within the last 10 years: no If all of the above answers are "NO", then may proceed with Cephalosporin   . Lisinopril     Sinus congestion     Home Medications  Prior to Admission medications   Medication Sig Start Date End Date Taking? Authorizing Provider  acetaminophen (TYLENOL) 325 MG tablet Take 2 tablets (650 mg total) by mouth every 6 (six) hours as needed for mild pain, fever or headache (or Fever >/= 101). 05/14/19   Roxan Hockey, MD  albuterol (PROVENTIL) (2.5 MG/3ML) 0.083% nebulizer solution Take 3 mLs (2.5 mg total) by nebulization every 6 (six) hours as needed for wheezing or shortness of breath. 05/14/19   Roxan Hockey, MD  albuterol (VENTOLIN HFA) 108 (90 Base) MCG/ACT inhaler Inhale 2 puffs into the lungs every 6 (six) hours as needed for wheezing or shortness of breath. 05/14/19   Roxan Hockey, MD  apixaban (ELIQUIS) 5 MG TABS tablet Take 1 tablet (5 mg total) by mouth 2 (two) times daily. 05/14/19 06/20/20  Roxan Hockey, MD  carvedilol (COREG) 25 MG tablet Take 2 tablets (50 mg total) by mouth 2 (two) times daily with a meal. 11/24/19   Strader, [place] M, PA-C  cephALEXin (KEFLEX) 500 MG capsule Take 1 capsule (500 mg total) by mouth 3 (three) times daily. 12/03/19   Inda Coke, PA  digoxin (LANOXIN) 0.125 MG tablet Take 1 tablet (0.125 mg total) by mouth daily. 11/29/19   Strader, Fransisco Hertz, PA-C  Dulaglutide (TRULICITY) A999333 0000000 SOPN Inject 0.75 mg into the skin once a week. 09/13/19   Shamleffer, Melanie Crazier, MD  gabapentin (NEURONTIN) 100 MG capsule Take 1 capsule (100 mg total) by mouth 3 (three) times daily. 05/14/19   Roxan Hockey, MD  glucose blood (CONTOUR NEXT TEST)  test strip 2x daily 06/09/19   Shamleffer, Melanie Crazier, MD  hydrALAZINE (APRESOLINE) 50 MG tablet Take 1 tablet (50 mg total) by mouth 3 (three) times daily. 05/14/19   Roxan Hockey, MD  insulin aspart protamine - aspart (NOVOLOG MIX 70/30 FLEXPEN) (70-30) 100 UNIT/ML FlexPen Inject 0.3 mLs (30 Units total) into the  skin 2 (two) times daily. Patient taking differently: Inject into the skin 2 (two) times daily. 20 units in am and 30 in the pm 08/27/19   Shamleffer, Melanie Crazier, MD  Insulin Pen Needle 32G X 4 MM MISC 1 Device by Does not apply route as directed. 08/27/19   Shamleffer, Melanie Crazier, MD  metFORMIN (GLUCOPHAGE) 1000 MG tablet Take 1 tablet (1,000 mg total) by mouth daily with breakfast. 06/09/19   Shamleffer, Melanie Crazier, MD  pantoprazole (PROTONIX) 40 MG tablet Take 1 tablet (40 mg total) by mouth daily. 11/29/19   Inda Coke, PA  potassium chloride SA (KLOR-CON) 20 MEQ tablet Take 1 tablet (20 mEq total) by mouth 2 (two) times daily. 05/14/19   Roxan Hockey, MD  tizanidine (ZANAFLEX) 2 MG capsule Take 1 capsule (2 mg total) by mouth 2 (two) times daily as needed for muscle spasms. 09/16/19   Lucretia Kern, DO  torsemide (DEMADEX) 20 MG tablet Take 1 tablet (20 mg total) by mouth 2 (two) times daily. 11/29/19   Ahmed Prima, Fransisco Hertz, PA-C  triamcinolone cream (KENALOG) 0.1 % Apply to affected area one to times daily 12/03/19   Inda Coke, PA     Critical care time: 50 minutes      CRITICAL CARE Performed by: Otilio Carpen Gleason   Total critical care time: 50 minutes  Critical care time was exclusive of separately billable procedures and treating other patients.  Critical care was necessary to treat or prevent imminent or life-threatening deterioration.  Critical care was time spent personally by me on the following activities: development of treatment plan with patient and/or surrogate as well as nursing, discussions with consultants, evaluation of  patient's response to treatment, examination of patient, obtaining history from patient or surrogate, ordering and performing treatments and interventions, ordering and review of laboratory studies, ordering and review of radiographic studies, pulse oximetry and re-evaluation of patient's condition.  Otilio Carpen Gleason, PA-C

## 2019-12-27 NOTE — Anesthesia Procedure Notes (Signed)
Procedure Name: Intubation Date/Time: 12/27/2019 1:08 AM Performed by: Suzy Bouchard, CRNA Pre-anesthesia Checklist: Patient identified, Emergency Drugs available, Suction available, Patient being monitored and Timeout performed Patient Re-evaluated:Patient Re-evaluated prior to induction Oxygen Delivery Method: Circle system utilized Preoxygenation: Pre-oxygenation with 100% oxygen Induction Type: IV induction, Rapid sequence and Cricoid Pressure applied Laryngoscope Size: Miller and 2 Grade View: Grade II Tube type: Oral Tube size: 7.0 mm Number of attempts: 2 Airway Equipment and Method: Stylet Placement Confirmation: ETT inserted through vocal cords under direct vision,  positive ETCO2 and breath sounds checked- equal and bilateral Secured at: 23 cm Tube secured with: Tape Dental Injury: Teeth and Oropharynx as per pre-operative assessment  Comments: DL x1 Gr 2 view, esop intubation, immediately recognized and removed.  DL x 2 after repositioning head Gr 1 view, ETT passed easily. Oropharynx clear on DL, teeth and lips unchanged.  OG down and stomach decompressed.

## 2019-12-27 NOTE — Progress Notes (Addendum)
eLink Physician-Brief Progress Note Patient Name: Robin [name] DOB: November 19, 1965 MRN: [id number]   Date of Service  12/27/2019  HPI/Events of Note  54 year old woman with stroke, s/p neuro IR intervention . PCCM consulted for vent support. Has arrived on propofol infusion, NS and diltiazem drips. SBP 135. Sedated.   eICU Interventions  Ordered CXR (do not see one post intubation) Ordered labs and propofol  Bedside PCCM notified  Please call E link if needed     Intervention Category Major Interventions: Change in mental status - evaluation and management Minor Interventions: Clinical assessment - ordering diagnostic tests Evaluation Type: New Patient Evaluation  Robin [name] Robin [name] 12/27/2019, 3:10 AM   5.15 am Issue with BP control Patient on propofol and diltiazem infusions Initial SBP was in 130s but not 142-143 on art line and 135 on non invasive We have increased diltiazem infusion to see if that helps with BP control (since this also helped with HR control) If this does not help in next 30 min, asked RN to let us know so we can switch it to clevidipine   6 am - Saw patient on camera HR in 80s SBP is 125-133 on cuff as well as Art line Continue plan as above, RN will let us know if BP becomes an issue

## 2019-12-27 NOTE — Progress Notes (Signed)
Patient transported to MRI and back to 0000000 with no complications.

## 2019-12-27 NOTE — ED Notes (Signed)
Spoke with IR, nurse was not available. Staff informed this nurse that they would call back for report, never received call back

## 2019-12-27 NOTE — Anesthesia Preprocedure Evaluation (Addendum)
Anesthesia Evaluation  Patient identified by MRN, date of birth, ID band  Reviewed: Unable to perform ROS - Chart review onlyPreop documentation limited or incomplete due to emergent nature of procedure.  Airway      Mouth opening: Limited Mouth Opening Comment: Patient uncooperatve with airway exam- unable to assess Dental  (+) Teeth Intact   Pulmonary COPD, former smoker,    + rhonchi        Cardiovascular hypertension, +CHF   Rhythm:Irregular Rate:Tachycardia     Neuro/Psych    GI/Hepatic GERD  ,(+)     substance abuse  cocaine use,   Endo/Other  diabetes, Poorly Controlled, Type 2  Renal/GU Renal disease     Musculoskeletal   Abdominal (+) + obese,   Peds  Hematology   Anesthesia Other Findings   Reproductive/Obstetrics                           Anesthesia Physical Anesthesia Plan  ASA: IV and emergent  Anesthesia Plan: General   Post-op Pain Management:    Induction: Intravenous  PONV Risk Score and Plan: Ondansetron  Airway Management Planned: Oral ETT  Additional Equipment: Arterial line  Intra-op Plan:   Post-operative Plan:   Informed Consent: I have reviewed the patients History and Physical, chart, labs and discussed the procedure including the risks, benefits and alternatives for the proposed anesthesia with the patient or authorized representative who has indicated his/her understanding and acceptance.       Plan Discussed with: Anesthesiologist and CRNA  Anesthesia Plan Comments:         Anesthesia Quick Evaluation

## 2019-12-27 NOTE — Progress Notes (Signed)
PT Cancellation Note  Patient Details Name: Robin [name] MRN: [id number] DOB: Mar 28, 1966   Cancelled Treatment:    Reason Eval/Treat Not Completed: Patient not medically ready. Pt intubated, sedation off however not following any commands. Pt scheduled for MRI at 1:30pm today. PT to return able s/p the MRI if appropriate.  Kittie Plater, PT, DPT Acute Rehabilitation Services Pager #: [phone] Office #: [phone]    Robin [name] 12/27/2019, 10:17 AM

## 2019-12-27 NOTE — Progress Notes (Signed)
2249 call time  2251 beeper time 2256 exam started 2257 exam finished 2257 images sent to [facility] 2258 exam completed in Epic 2258 [facility] radiology called

## 2019-12-27 NOTE — Progress Notes (Signed)
OT Cancellation Note  Patient Details Name: Robin [name] MRN: [id number] DOB: 12/24/1965   Cancelled Treatment:    Reason Eval/Treat Not Completed: Patient not medically ready ready. Pt intubated, sedation off however not following any commands. Pt scheduled for MRI at 1:30pm today.  Billey Chang, OTR/L  Acute Rehabilitation Services Pager: [phone] Office: [phone] .  12/27/2019, 12:41 PM

## 2019-12-27 NOTE — Progress Notes (Signed)
NAME:  Robin [name], MRN:  [id number], DOB:  28-Nov-1965, LOS: 0 ADMISSION DATE:  12/26/2019, CONSULTATION DATE:  12/27/19 REFERRING MD:  Cheral Marker, CHIEF COMPLAINT:  Acute CVA   Brief History   54 year old female admitted with acute MCA CVA and left ICA LVO, taken emergently for thrombectomy and left intubated post procedure.  PCCM consulted for vent management.  History of present illness   54 year old female with a history of diabetes pretension, A. fib on Eliquis, heart failure, substance abuse who presented after a fall with right-sided weakness as a code stroke.  Found to have left MCA CVA and left ICA occlusion.  Taken emergently for thrombectomy and transferred to the ICU intubated.  PCCM consulted for vent management  Past Medical History   has a past medical history of Allergy, Anemia, Arthritis, CHF (congestive heart failure) ([place]), Chronic abdominal pain, Cocaine abuse ([place]), COPD (chronic obstructive pulmonary disease) ([place]), Essential hypertension, benign, Fatty liver, GERD (gastroesophageal reflux disease), Gout (2016), Normal coronary arteries, Ovarian cyst, and Type 2 diabetes mellitus ([place]).   Significant Hospital Events   5/24 Admit to neurology  Consults:  PCCM  Procedures:  5/24 ETT  Significant Diagnostic Tests:  5/23 CT head>>evolving hypodensity involving the left basal ganglia, left insula, and adjacent left temporal lobe, which could reflect an evolving left MCA territory infarct. Additionally, there is question of asymmetric hyperdensity at the left ICA terminus, which could reflect intraluminal thrombus/LVO MRI 5/24 >>> Echo 5/24 >  Micro Data:  5/24 Sars-Cov-2>>negative 5/24 UC>>  Antimicrobials:    Interim history/subjective:  No acute events since her procedure.   Objective   Blood pressure 126/80, pulse 86, temperature 98.2 F (36.8 C), temperature source Axillary, resp. rate 18, height 5\' 6"  (1.676 m), weight 98.9 kg, last menstrual period  08/21/2011, SpO2 100 %.    Vent Mode: PRVC FiO2 (%):  [40 %] 40 % Set Rate:  [16 bmp] 16 bmp Vt Set:  [470 mL] 470 mL PEEP:  [5 cmH20] 5 cmH20 Plateau Pressure:  [20 cmH20] 20 cmH20   Intake/Output Summary (Last 24 hours) at 12/27/2019 0853 Last data filed at 12/27/2019 0800 Gross per 24 hour  Intake 695.86 ml  Output 50 ml  Net 645.86 ml   Filed Weights   12/26/19 2243  Weight: 98.9 kg    General: obese female intubated and sedated HEENT: MM pink/moist, ETT Neuro: Does not open eyes. Moves L side spontaneously but not to commands. No movement on R.  CV: s1s2 irregular rhythm, no m/r/g PULM: Clear, no wheeze GI: Soft, NT, ND Extremities: No acute deformity Skin: no rashes or lesions   Resolved Hospital Problem list     Assessment & Plan:   Acute left MCA ischemic CVA with left ICA LVO s/p thrombectomy Management per neurology, SBP goal 120-130 -TTE and MRI pending -Lipid panel, A1c - Management per stroke service  Acute hypoxemic respirtory failure: in the post operative setting requiring sedation.  COPD without acute exacerbation.  - Full vent support - Not a candidate for SBT today - Duonebs - VAP bundle  Atrial fibrillation, HTN Chronic HFrEF: LVEF 35% - Eliquis holding for 24 hours post procedure, may need different agent as she has failed eliquis at this point, assuming she is medically compliant with it.  - Diltiazem infusion wean off as tolerated - Echo pending - Continue home coreg, digoxin, torsemide  Type 2 diabetes - Holding metformin - CBG monitoring and SSI. Increase scale.  - Add glargine 10 units  Nutrition - start TF  Best practice:  Diet: N.p.o. Pain/Anxiety/Delirium protocol (if indicated): Propofol, fentanyl VAP protocol (if indicated): HOB 30 degrees, suction as needed DVT prophylaxis: SCDs GI prophylaxis: Protonix Glucose control: SSI Mobility: Bedrest Code Status: Full code Family Communication: Per primary Disposition:  ICU  Labs   CBC: Recent Labs  Lab 12/26/19 2254 12/27/19 0305  WBC 8.1 7.9  NEUTROABS 5.1 6.1  HGB 13.5 13.3  HCT 42.0 41.6  MCV 91.7 91.6  PLT 221 [id number]    Basic Metabolic Panel: Recent Labs  Lab 12/26/19 2254 12/27/19 0305  NA 136 136  K 3.9 4.9  CL 104 106  CO2 21* 20*  GLUCOSE 355* 345*  BUN 15 13  CREATININE 1.15* 0.95  CALCIUM 8.7* 8.3*   GFR: Estimated Creatinine Clearance: 81.2 mL/min (by C-G formula based on SCr of 0.95 mg/dL). Recent Labs  Lab 12/26/19 2254 12/27/19 0305  WBC 8.1 7.9    Liver Function Tests: Recent Labs  Lab 12/26/19 2254  AST 36  ALT 26  ALKPHOS 87  BILITOT 0.5  PROT 7.0  ALBUMIN 3.7   No results for input(s): LIPASE, AMYLASE in the last 168 hours. No results for input(s): AMMONIA in the last 168 hours.  ABG    Component Value Date/Time   PHART 7.519 (H) 07/27/2007 1302   PCO2ART 28.1 (L) 07/27/2007 1302   PO2ART 110.0 (H) 07/27/2007 1302   HCO3 22.8 07/27/2007 1302   TCO2 25 04/19/2017 1747   O2SAT 98.6 07/27/2007 1302     Coagulation Profile: Recent Labs  Lab 12/26/19 2255  INR 1.2    Cardiac Enzymes: No results for input(s): CKTOTAL, CKMB, CKMBINDEX, TROPONINI in the last 168 hours.  HbA1C: Hemoglobin A1C  Date/Time Value Ref Range Status  09/13/2019 07:38 AM 10.3 (A) 4.0 - 5.6 % Final   Hgb A1c MFr Bld  Date/Time Value Ref Range Status  12/27/2019 03:05 AM 11.6 (H) 4.8 - 5.6 % Final    Comment:    (NOTE) Pre diabetes:          5.7%-6.4% Diabetes:              >6.4% Glycemic control for   <7.0% adults with diabetes   06/28/2019 10:00 AM 11.4 (H) 4.8 - 5.6 % Final    Comment:    (NOTE) Pre diabetes:          5.7%-6.4% Diabetes:              >6.4% Glycemic control for   <7.0% adults with diabetes     CBG: Recent Labs  Lab 12/26/19 2247 12/27/19 0302 12/27/19 0749  GLUCAP 319* 299* 208*    Review of Systems:   Unable to obtain secondary to mental status  Past Medical History  She,   has a past medical history of Allergy, Anemia, Arthritis, CHF (congestive heart failure) ([place]), Chronic abdominal pain, Cocaine abuse ([place]), COPD (chronic obstructive pulmonary disease) ([place]), Essential hypertension, benign, Fatty liver, GERD (gastroesophageal reflux disease), Gout (2016), Normal coronary arteries, Ovarian cyst, and Type 2 diabetes mellitus ([place]).   Surgical History    Past Surgical History:  Procedure Laterality Date  . ABDOMINAL HYSTERECTOMY  09/10/2011   Procedure: HYSTERECTOMY ABDOMINAL;  Surgeon: Jonnie Kind, MD;  Location: [facility];  Service: Gynecology;  Laterality: N/A;  Abdominal hysterectomy  . CESAREAN SECTION  [id number], and 1994  . CHOLECYSTECTOMY  1995  . IR CT HEAD LTD  12/27/2019  . IR PERCUTANEOUS ART THROMBECTOMY/INFUSION INTRACRANIAL  INC DIAG ANGIO  12/27/2019  . IR US GUIDE VASC ACCESS RIGHT  12/27/2019  . SCAR REVISION  09/10/2011   Procedure: SCAR REVISION;  Surgeon: Jonnie Kind, MD;  Location: [facility];  Service: Gynecology;  Laterality: N/A;  Wide Excision of old Cicatrix  . TUBAL LIGATION  1994     Social History   reports that she has quit smoking. Her smoking use included cigarettes. She has a 7.25 pack-year smoking history. She has never used smokeless tobacco. She reports current alcohol use. She reports previous drug use. Drugs: Cocaine and Marijuana.   Family History   Her family history includes Alcohol abuse in her father, mother, and son; Cirrhosis in her mother; Diabetes type II in her father and sister; Early death in her brother and mother. There is no history of Anesthesia problems, Hypotension, Malignant hyperthermia, Pseudochol deficiency, Colon cancer, Colon polyps, Esophageal cancer, Rectal cancer, or Stomach cancer.   Allergies Allergies  Allergen Reactions  . Bee Venom Shortness Of Breath and Swelling    Bodily Swelling  . Losartan Other (See Comments)    Nosebleeds per patient report.   . Naproxen Other (See Comments)     Effects acid reflux  . Penicillins Nausea Only    Has patient had a PCN reaction causing immediate rash, facial/tongue/throat swelling, SOB or lightheadedness with hypotension: no Has patient had a PCN reaction causing severe rash involving mucus membranes or skin necrosis: no Has patient had a PCN reaction that required hospitalization no Has patient had a PCN reaction occurring within the last 10 years: no If all of the above answers are "NO", then may proceed with Cephalosporin   . Lisinopril     Sinus congestion     Home Medications  Prior to Admission medications   Medication Sig Start Date End Date Taking? Authorizing Provider  acetaminophen (TYLENOL) 325 MG tablet Take 2 tablets (650 mg total) by mouth every 6 (six) hours as needed for mild pain, fever or headache (or Fever >/= 101). 05/14/19   Roxan Hockey, MD  albuterol (PROVENTIL) (2.5 MG/3ML) 0.083% nebulizer solution Take 3 mLs (2.5 mg total) by nebulization every 6 (six) hours as needed for wheezing or shortness of breath. 05/14/19   Roxan Hockey, MD  albuterol (VENTOLIN HFA) 108 (90 Base) MCG/ACT inhaler Inhale 2 puffs into the lungs every 6 (six) hours as needed for wheezing or shortness of breath. 05/14/19   Roxan Hockey, MD  apixaban (ELIQUIS) 5 MG TABS tablet Take 1 tablet (5 mg total) by mouth 2 (two) times daily. 05/14/19 06/20/20  Roxan Hockey, MD  carvedilol (COREG) 25 MG tablet Take 2 tablets (50 mg total) by mouth 2 (two) times daily with a meal. 11/24/19   Strader, [place] M, PA-C  cephALEXin (KEFLEX) 500 MG capsule Take 1 capsule (500 mg total) by mouth 3 (three) times daily. 12/03/19   Inda Coke, PA  digoxin (LANOXIN) 0.125 MG tablet Take 1 tablet (0.125 mg total) by mouth daily. 11/29/19   Strader, Fransisco Hertz, PA-C  Dulaglutide (TRULICITY) [id number] [id number] SOPN Inject 0.75 mg into the skin once a week. 09/13/19   Shamleffer, Melanie Crazier, MD  gabapentin (NEURONTIN) 100 MG capsule Take 1 capsule (100 mg  total) by mouth 3 (three) times daily. 05/14/19   Roxan Hockey, MD  glucose blood (CONTOUR NEXT TEST) test strip 2x daily 06/09/19   Shamleffer, Melanie Crazier, MD  hydrALAZINE (APRESOLINE) 50 MG tablet Take 1 tablet (50 mg total) by mouth 3 (three) times  daily. 05/14/19   Roxan Hockey, MD  insulin aspart protamine - aspart (NOVOLOG MIX 70/30 FLEXPEN) (70-30) 100 UNIT/ML FlexPen Inject 0.3 mLs (30 Units total) into the skin 2 (two) times daily. Patient taking differently: Inject into the skin 2 (two) times daily. 20 units in am and 30 in the pm 08/27/19   Shamleffer, Melanie Crazier, MD  Insulin Pen Needle 32G X 4 MM MISC 1 Device by Does not apply route as directed. 08/27/19   Shamleffer, Melanie Crazier, MD  metFORMIN (GLUCOPHAGE) 1000 MG tablet Take 1 tablet (1,000 mg total) by mouth daily with breakfast. 06/09/19   Shamleffer, Melanie Crazier, MD  pantoprazole (PROTONIX) 40 MG tablet Take 1 tablet (40 mg total) by mouth daily. 11/29/19   Inda Coke, PA  potassium chloride SA (KLOR-CON) 20 MEQ tablet Take 1 tablet (20 mEq total) by mouth 2 (two) times daily. 05/14/19   Roxan Hockey, MD  tizanidine (ZANAFLEX) 2 MG capsule Take 1 capsule (2 mg total) by mouth 2 (two) times daily as needed for muscle spasms. 09/16/19   Lucretia Kern, DO  torsemide (DEMADEX) 20 MG tablet Take 1 tablet (20 mg total) by mouth 2 (two) times daily. 11/29/19   Ahmed Prima, Fransisco Hertz, PA-C  triamcinolone cream (KENALOG) 0.1 % Apply to affected area one to times daily 12/03/19   Inda Coke, PA     Critical care time: 50 minutes      CRITICAL CARE Performed by: Corey Harold   Total critical care time: 50 minutes  Critical care time was exclusive of separately billable procedures and treating other patients.  Critical care was necessary to treat or prevent imminent or life-threatening deterioration.  Critical care was time spent personally by me on the following activities: development of treatment  plan with patient and/or surrogate as well as nursing, discussions with consultants, evaluation of patient's response to treatment, examination of patient, obtaining history from patient or surrogate, ordering and performing treatments and interventions, ordering and review of laboratory studies, ordering and review of radiographic studies, pulse oximetry and re-evaluation of patient's condition.  Corey Harold, NP

## 2019-12-27 NOTE — Anesthesia Postprocedure Evaluation (Signed)
Anesthesia Post Note  Patient: Robin [name]  Procedure(s) Performed: IR WITH ANESTHESIA (N/A )     Anesthesia Post Evaluation  Last Vitals:  Vitals:   12/26/19 2330 12/27/19 0000  BP: (!) 154/109 (!) 186/103  Pulse: (!) 112 (!) 111  Resp: (!) 24 (!) 23  Temp:    SpO2: 97% 99%    Last Pain:  Vitals:   12/26/19 2245  TempSrc: Rectal  PainSc:                  Ivar Drape

## 2019-12-27 NOTE — Progress Notes (Signed)
*  PRELIMINARY RESULTS* Echocardiogram 2D Echocardiogram has been performed.  Robin [name] 12/27/2019, 12:48 PM

## 2019-12-27 NOTE — Progress Notes (Signed)
Initial Nutrition Assessment  DOCUMENTATION CODES:   Obesity unspecified  INTERVENTION:   Initiate tube feeding via OG tube: Vital High Protein at 40 ml/h (960 ml per day) Pro-stat 30 ml TID MVI daily   Provides 1260 kcal, 129 gm protein, 802 ml free water daily   NUTRITION DIAGNOSIS:   Inadequate oral intake related to inability to eat as evidenced by NPO status.  GOAL:   Patient will meet greater than or equal to 90% of their needs  MONITOR:   TF tolerance, Vent status  REASON FOR ASSESSMENT:   Consult, Ventilator Enteral/tube feeding initiation and management  ASSESSMENT:     Pt with PMH Of CHF, anemia, cocaine abuse, COPD, HTN, fatty liver, GERD, DM who was admitted for L MCA CVA and L ICA occlusion s/p thrombectomy. Noted positive for cocaine on admission.   Pt discussed during ICU rounds and with RN.  Pt unable to answer any questions, spoke with daughter and patient's sister. They report no recent weight changes or changes in appetite.    Patient is currently intubated on ventilator support MV: 8.2 L/min Temp (24hrs), Avg:97.5 F (36.4 C), Min:96.7 F (35.9 C), Max:98.2 F (36.8 C)  Medications reviewed and include: colace, SSI, 10 units lantus bedtime, miralax, 20 mEq KCl BID Cleviprex and Propofol off Labs reviewed:  CBG's: 208-246 (NPO) OG tube; per xray extends below hemidiaphragm    NUTRITION - FOCUSED PHYSICAL EXAM:    Most Recent Value  Orbital Region  No depletion  Upper Arm Region  No depletion  Thoracic and Lumbar Region  No depletion  Buccal Region  No depletion  Temple Region  No depletion  Clavicle Bone Region  No depletion  Clavicle and Acromion Bone Region  No depletion  Scapular Bone Region  Unable to assess  Dorsal Hand  No depletion  Patellar Region  No depletion  Anterior Thigh Region  No depletion  Posterior Calf Region  No depletion  Edema (RD Assessment)  None  Hair  Reviewed  Eyes  Unable to assess  Mouth  Unable to  assess  Skin  Reviewed  Nails  Reviewed       Diet Order:   Diet Order            Diet NPO time specified  Diet effective now              EDUCATION NEEDS:   No education needs have been identified at this time  Skin:  Skin Assessment: Reviewed RN Assessment  Last BM:  unknown  Height:   Ht Readings from Last 1 Encounters:  12/26/19 5\' 6"  (1.676 m)    Weight:   Wt Readings from Last 1 Encounters:  12/26/19 98.9 kg    Ideal Body Weight:  59 kg  BMI:  Body mass index is 35.19 kg/m.  Estimated Nutritional Needs:   Kcal:  1100-1400  Protein:  >118 grams  Fluid:  > 1.5 L/day  Lockie Pares., RD, LDN, CNSC See AMiON for contact information

## 2019-12-27 NOTE — Transfer of Care (Signed)
Immediate Anesthesia Transfer of Care Note  Patient: Robin [name]  Procedure(s) Performed: IR WITH ANESTHESIA (N/A )  Patient Location: ICU  Anesthesia Type:General  Level of Consciousness: sedated and Patient remains intubated per anesthesia plan  Airway & Oxygen Therapy: Patient remains intubated per anesthesia plan and Patient placed on Ventilator (see vital sign flow sheet for setting)  Post-op Assessment: Report given to RN and Post -op Vital signs reviewed and stable  Post vital signs: Reviewed and stable  Last Vitals:  Vitals Value Taken Time  BP    Temp    Pulse    Resp    SpO2      Last Pain:  Vitals:   12/26/19 2245  TempSrc: Rectal  PainSc:          Complications: No apparent anesthesia complications

## 2019-12-27 NOTE — Progress Notes (Signed)
Referring Physician(s): Code Stroke- Kerney Elbe  Supervising Physician: Corrie Mckusick  Patient Status:  [facility] - In-pt  Chief Complaint: None- intubated without sedation  Subjective:  History of acute CVA s/p cerebral arteriogram with emergent mechanical thrombectomy of left ICA terminus occlusion achieving a TICI 3 revascularization via right femoral approach 12/27/2019 by Dr. Earleen Newport. Patient laying in bed intubated without sedation. She does not open eyes to voice/painful stimuli and does not follow simple commands. Can spontaneously move left side, RLE withdraws from pain, no movements of RUE. Right groin incision c/d/i.   Allergies: Bee venom, Losartan, Naproxen, Penicillins, and Lisinopril  Medications: Prior to Admission medications   Medication Sig Start Date End Date Taking? Authorizing Provider  acetaminophen (TYLENOL) 325 MG tablet Take 2 tablets (650 mg total) by mouth every 6 (six) hours as needed for mild pain, fever or headache (or Fever >/= 101). 05/14/19   Roxan Hockey, MD  albuterol (PROVENTIL) (2.5 MG/3ML) 0.083% nebulizer solution Take 3 mLs (2.5 mg total) by nebulization every 6 (six) hours as needed for wheezing or shortness of breath. 05/14/19   Roxan Hockey, MD  albuterol (VENTOLIN HFA) 108 (90 Base) MCG/ACT inhaler Inhale 2 puffs into the lungs every 6 (six) hours as needed for wheezing or shortness of breath. 05/14/19   Roxan Hockey, MD  apixaban (ELIQUIS) 5 MG TABS tablet Take 1 tablet (5 mg total) by mouth 2 (two) times daily. 05/14/19 06/20/20  Roxan Hockey, MD  carvedilol (COREG) 25 MG tablet Take 2 tablets (50 mg total) by mouth 2 (two) times daily with a meal. 11/24/19   Strader, [place] M, PA-C  cephALEXin (KEFLEX) 500 MG capsule Take 1 capsule (500 mg total) by mouth 3 (three) times daily. 12/03/19   Inda Coke, PA  digoxin (LANOXIN) 0.125 MG tablet Take 1 tablet (0.125 mg total) by mouth daily. 11/29/19   Strader, Fransisco Hertz, PA-C    Dulaglutide (TRULICITY) [id number] [id number] SOPN Inject 0.75 mg into the skin once a week. 09/13/19   Shamleffer, Melanie Crazier, MD  gabapentin (NEURONTIN) 100 MG capsule Take 1 capsule (100 mg total) by mouth 3 (three) times daily. 05/14/19   Roxan Hockey, MD  glucose blood (CONTOUR NEXT TEST) test strip 2x daily 06/09/19   Shamleffer, Melanie Crazier, MD  hydrALAZINE (APRESOLINE) 50 MG tablet Take 1 tablet (50 mg total) by mouth 3 (three) times daily. 05/14/19   Roxan Hockey, MD  insulin aspart protamine - aspart (NOVOLOG MIX 70/30 FLEXPEN) (70-30) 100 UNIT/ML FlexPen Inject 0.3 mLs (30 Units total) into the skin 2 (two) times daily. Patient taking differently: Inject into the skin 2 (two) times daily. 20 units in am and 30 in the pm 08/27/19   Shamleffer, Melanie Crazier, MD  Insulin Pen Needle 32G X 4 MM MISC 1 Device by Does not apply route as directed. 08/27/19   Shamleffer, Melanie Crazier, MD  metFORMIN (GLUCOPHAGE) 1000 MG tablet Take 1 tablet (1,000 mg total) by mouth daily with breakfast. 06/09/19   Shamleffer, Melanie Crazier, MD  pantoprazole (PROTONIX) 40 MG tablet Take 1 tablet (40 mg total) by mouth daily. 11/29/19   Inda Coke, PA  potassium chloride SA (KLOR-CON) 20 MEQ tablet Take 1 tablet (20 mEq total) by mouth 2 (two) times daily. 05/14/19   Roxan Hockey, MD  tizanidine (ZANAFLEX) 2 MG capsule Take 1 capsule (2 mg total) by mouth 2 (two) times daily as needed for muscle spasms. 09/16/19   Lucretia Kern, DO  torsemide (DEMADEX) 20 MG  tablet Take 1 tablet (20 mg total) by mouth 2 (two) times daily. 11/29/19   Ahmed Prima, Fransisco Hertz, PA-C  triamcinolone cream (KENALOG) 0.1 % Apply to affected area one to times daily 12/03/19   Inda Coke, PA     Vital Signs: BP 119/76   Pulse (!) 46   Temp 98.2 F (36.8 C) (Axillary)   Resp 17   Ht 5\' 6"  (1.676 m)   Wt 218 lb 0.6 oz (98.9 kg)   LMP 08/21/2011   SpO2 100%   BMI 35.19 kg/m   Physical Exam Vitals and nursing note  reviewed.  Constitutional:      General: She is not in acute distress.    Comments: Intubated without sedation.  Pulmonary:     Effort: Pulmonary effort is normal. No respiratory distress.     Comments: Intubated without sedation. Skin:    General: Skin is warm and dry.     Comments: Right groin incision soft without active bleeding or hematoma.  Neurological:     Comments: Intubated without sedation. She does not open eyes to voice/painful stimuli and does not follow simple commands. PERRL bilaterally. Left gaze preference. Can spontaneously move left side, RLE withdraws from pain, no movements of RUE. Distal pulses (DPs) 1+ bilaterally.     Imaging: CT Angio Head W or Wo Contrast  Result Date: 12/27/2019 CLINICAL DATA:  Initial evaluation for acute stroke, unresponsiveness. EXAM: CT ANGIOGRAPHY HEAD AND NECK CT PERFUSION BRAIN TECHNIQUE: Multidetector CT imaging of the head and neck was performed using the standard protocol during bolus administration of intravenous contrast. Multiplanar CT image reconstructions and MIPs were obtained to evaluate the vascular anatomy. Carotid stenosis measurements (when applicable) are obtained utilizing NASCET criteria, using the distal internal carotid diameter as the denominator. Multiphase CT imaging of the brain was performed following IV bolus contrast injection. Subsequent parametric perfusion maps were calculated using RAPID software. CONTRAST:  162mL OMNIPAQUE IOHEXOL 350 MG/ML SOLN COMPARISON:  Prior head CT from earlier the same day. FINDINGS: CTA NECK FINDINGS Aortic arch: Visualized aortic arch of normal caliber with normal branch pattern. Mild atheromatous change about the arch and origin of the great vessels without stenosis. Visualized subclavian arteries widely patent. Right carotid system: Right common and internal carotid arteries widely patent without stenosis, dissection or occlusion. Left carotid system: Left common carotid artery  patent from its origin to the bifurcation without stenosis. Mild atheromatous change about the left bifurcation without significant stenosis. Left ICA widely patent distally to the skull base without stenosis, dissection or occlusion. Vertebral arteries: Both vertebral arteries arise from the subclavian arteries. Right vertebral artery dominant, with a diffusely hypoplastic left vertebral artery. Multifocal moderate to severe segmental stenoses seen involving the hypoplastic left vertebral artery, most pronounced proximally. On the right, there is a single short-segment severe stenosis involving the right V1 segment (series 4, image 272). Otherwise, right vertebral artery patent within the neck without stenosis or other abnormality. Skeleton: No acute osseous finding. Focal sclerotic lesion within the T2 vertebral body noted, nonspecific, but likely benign. No other worrisome lytic or blastic osseous lesions. Other neck: No other acute soft tissue abnormality within the neck. No mass lesion or adenopathy. 7 mm left thyroid nodule noted, felt to be of doubtful significance given size and patient age. No follow-up imaging recommended regarding this lesion. Upper chest: Visualized upper chest demonstrates no acute finding. Partially visualized lungs are largely clear. Review of the MIP images confirms the above findings CTA HEAD FINDINGS Anterior  circulation: On the right, the petrous, cavernous, and supraclinoid segments are widely patent to the terminus. 5 x 6 x 7 mm saccular aneurysm seen arising from the para clinoid right ICA (series 4, image 116). This is directed medially and posteriorly. On the left, patent but somewhat attenuated flow within the petrous and cavernous left ICA, with subsequent occlusion at the supraclinoid segment/left ICA terminus (series 4, image 115). A1 segments are opacified bilaterally, with filling of the left A1 via collateralization across the circle-of-Willis. A2 segments well  perfused to their distal aspects. Opacification of the mid and distal left M1 segment is seen, which could be related to subocclusive thrombus and/or collateralization. Normal left MCA bifurcation. Attenuated flow seen within the left MCA branches distally, likely via collateralization. Right M1 widely patent. Normal right MCA bifurcation. Distal right MCA branches well perfused. Small vessel atheromatous irregularity noted. Posterior circulation: Vertebral arteries patent to the vertebrobasilar junction without stenosis. Both picas patent. Basilar diminutive but patent to its distal aspect without stenosis. Superior cerebral arteries patent at their origins. Both PCAs primarily supplied via the basilar well perfused to their distal aspects. Venous sinuses: Grossly patent allowing for timing of the contrast bolus. Anatomic variants: None significant. Review of the MIP images confirms the above findings CT Brain Perfusion Findings: ASPECTS: 6 CBF (<30%) Volume: 49mL Perfusion (Tmax>6.0s) volume: 267mL Mismatch Volume: 234mL Infarction Location:Patchy core infarct seen involving the deep and subcortical white matter of the anterior left frontal lobe. Large surrounding ischemic penumbra throughout the majority of the left MCA distribution. IMPRESSION: CTA HEAD AND NECK IMPRESSION: 1. Positive CTA with occlusive thrombus at the left ICA terminus. Distal reconstitution at the mid and distal left M1 segment with attenuated flow distally within the left MCA branches, which could be related to subocclusive thrombus and/or collateralization. 2. Mild atheromatous change about the left carotid bifurcation without stenosis. No other hemodynamically significant stenosis involving the left carotid system. 3. Multifocal moderate to severe stenoses involving the hypoplastic left vertebral artery, most notable proximally. Single short-segment severe proximal right V1 stenosis. Dominant right vertebral artery otherwise widely patent.  4. 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 CT PERFUSION IMPRESSION: 1. 24 cc acute core infarct involving the deep and subcortical white matter of the anterior left frontal region. Two hundred fourteen cc surrounding ischemic penumbra involving essentially the entirety of the left MCA distribution. 2. Aspects = 6 on prior head CT. Critical Value/emergent results were called by telephone at the time of interpretation on 12/26/2019 at 11:48 pm to provider Dr. Tomi Bamberger, Who verbally acknowledged these results. Critical Value/emergent results were called by telephone at the time of interpretation on 12/26/2019 at 11:54 pm to provider Dr. Cheral Marker, Who verbally acknowledged these results. Electronically Signed   By: Jeannine Boga M.D.   On: 12/27/2019 00:41   CT Angio Neck W and/or Wo Contrast  Result Date: 12/27/2019 CLINICAL DATA:  Initial evaluation for acute stroke, unresponsiveness. EXAM: CT ANGIOGRAPHY HEAD AND NECK CT PERFUSION BRAIN TECHNIQUE: Multidetector CT imaging of the head and neck was performed using the standard protocol during bolus administration of intravenous contrast. Multiplanar CT image reconstructions and MIPs were obtained to evaluate the vascular anatomy. Carotid stenosis measurements (when applicable) are obtained utilizing NASCET criteria, using the distal internal carotid diameter as the denominator. Multiphase CT imaging of the brain was performed following IV bolus contrast injection. Subsequent parametric perfusion maps were calculated using RAPID software. CONTRAST:  162mL OMNIPAQUE IOHEXOL 350  MG/ML SOLN COMPARISON:  Prior head CT from earlier the same day. FINDINGS: CTA NECK FINDINGS Aortic arch: Visualized aortic arch of normal caliber with normal branch pattern. Mild atheromatous change about the arch and origin of the great vessels without stenosis. Visualized subclavian arteries widely patent. Right carotid system: Right common and internal carotid  arteries widely patent without stenosis, dissection or occlusion. Left carotid system: Left common carotid artery patent from its origin to the bifurcation without stenosis. Mild atheromatous change about the left bifurcation without significant stenosis. Left ICA widely patent distally to the skull base without stenosis, dissection or occlusion. Vertebral arteries: Both vertebral arteries arise from the subclavian arteries. Right vertebral artery dominant, with a diffusely hypoplastic left vertebral artery. Multifocal moderate to severe segmental stenoses seen involving the hypoplastic left vertebral artery, most pronounced proximally. On the right, there is a single short-segment severe stenosis involving the right V1 segment (series 4, image 272). Otherwise, right vertebral artery patent within the neck without stenosis or other abnormality. Skeleton: No acute osseous finding. Focal sclerotic lesion within the T2 vertebral body noted, nonspecific, but likely benign. No other worrisome lytic or blastic osseous lesions. Other neck: No other acute soft tissue abnormality within the neck. No mass lesion or adenopathy. 7 mm left thyroid nodule noted, felt to be of doubtful significance given size and patient age. No follow-up imaging recommended regarding this lesion. Upper chest: Visualized upper chest demonstrates no acute finding. Partially visualized lungs are largely clear. Review of the MIP images confirms the above findings CTA HEAD FINDINGS Anterior circulation: On the right, the petrous, cavernous, and supraclinoid segments are widely patent to the terminus. 5 x 6 x 7 mm saccular aneurysm seen arising from the para clinoid right ICA (series 4, image 116). This is directed medially and posteriorly. On the left, patent but somewhat attenuated flow within the petrous and cavernous left ICA, with subsequent occlusion at the supraclinoid segment/left ICA terminus (series 4, image 115). A1 segments are opacified  bilaterally, with filling of the left A1 via collateralization across the circle-of-Willis. A2 segments well perfused to their distal aspects. Opacification of the mid and distal left M1 segment is seen, which could be related to subocclusive thrombus and/or collateralization. Normal left MCA bifurcation. Attenuated flow seen within the left MCA branches distally, likely via collateralization. Right M1 widely patent. Normal right MCA bifurcation. Distal right MCA branches well perfused. Small vessel atheromatous irregularity noted. Posterior circulation: Vertebral arteries patent to the vertebrobasilar junction without stenosis. Both picas patent. Basilar diminutive but patent to its distal aspect without stenosis. Superior cerebral arteries patent at their origins. Both PCAs primarily supplied via the basilar well perfused to their distal aspects. Venous sinuses: Grossly patent allowing for timing of the contrast bolus. Anatomic variants: None significant. Review of the MIP images confirms the above findings CT Brain Perfusion Findings: ASPECTS: 6 CBF (<30%) Volume: 9mL Perfusion (Tmax>6.0s) volume: 220mL Mismatch Volume: 23mL Infarction Location:Patchy core infarct seen involving the deep and subcortical white matter of the anterior left frontal lobe. Large surrounding ischemic penumbra throughout the majority of the left MCA distribution. IMPRESSION: CTA HEAD AND NECK IMPRESSION: 1. Positive CTA with occlusive thrombus at the left ICA terminus. Distal reconstitution at the mid and distal left M1 segment with attenuated flow distally within the left MCA branches, which could be related to subocclusive thrombus and/or collateralization. 2. Mild atheromatous change about the left carotid bifurcation without stenosis. No other hemodynamically significant stenosis involving the left carotid system. 3. Multifocal  moderate to severe stenoses involving the hypoplastic left vertebral artery, most notable proximally.  Single short-segment severe proximal right V1 stenosis. Dominant right vertebral artery otherwise widely patent. 4. 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 CT PERFUSION IMPRESSION: 1. 24 cc acute core infarct involving the deep and subcortical white matter of the anterior left frontal region. Two hundred fourteen cc surrounding ischemic penumbra involving essentially the entirety of the left MCA distribution. 2. Aspects = 6 on prior head CT. Critical Value/emergent results were called by telephone at the time of interpretation on 12/26/2019 at 11:48 pm to provider Dr. Tomi Bamberger, Who verbally acknowledged these results. Critical Value/emergent results were called by telephone at the time of interpretation on 12/26/2019 at 11:54 pm to provider Dr. Cheral Marker, Who verbally acknowledged these results. Electronically Signed   By: Jeannine Boga M.D.   On: 12/27/2019 00:41   IR CT Head Ltd  Result Date: 12/27/2019 INDICATION: 54 year old female with a history of acute left MCA stroke, ELVO, presenting for mechanical thrombectomy EXAM: ULTRASOUND GUIDED ACCESS RIGHT COMMON FEMORAL ARTERY CERVICAL AND CEREBRAL ANGIOGRAM MECHANICAL THROMBECTOMY LEFT ICA ANGIO-SEAL FOR HEMOSTASIS COMPARISON:  CT IMAGING SAME DAY MEDICATIONS: NONE ANESTHESIA/SEDATION: The anesthesia team was present to provide general endotracheal tube anesthesia and for patient monitoring during the procedure. Intubation was performed in Neuro biplane room. Radial arterial line was performed by the anesthesia team. interventional neuro radiology nursing staff was also present. CONTRAST:  50 cc Omnipaque 300 FLUOROSCOPY TIME:  Fluoroscopy Time: 8 minutes 48 seconds (901 mGy). COMPLICATIONS: None TECHNIQUE: Informed written consent was obtained from the patient's family after a thorough discussion of the procedural risks, benefits and alternatives. Specific risks discussed include: Bleeding, infection, contrast reaction, kidney  injury/failure, need for further procedure/surgery, arterial injury or dissection, embolization to new territory, intracranial hemorrhage (10-15% risk), neurologic deterioration, cardiopulmonary collapse, death. All questions were addressed. Maximal Sterile Barrier Technique was utilized including during the procedure including caps, mask, sterile gowns, sterile gloves, sterile drape, hand hygiene and skin antiseptic. A timeout was performed prior to the initiation of the procedure. The anesthesia team was present to provide general endotracheal tube anesthesia and for patient monitoring during the procedure. Interventional neuro radiology nursing staff was also present. FINDINGS: Initial Findings: Left internal carotid artery: Initial angiogram demonstrates ICA terminus occlusion, without significant plaque of the petrous segment, last seroma segment, cavernous segment or the proximal supraclinoid segment. Ophthalmic artery is patent, with choroid blush. Patent posterior communicating artery with flow into the left PCA. Left MCA: Initial imaging demonstrates no significant primary filling of the MCA. There are leptomeningeal collateral flows partially filling the superior division of the MCA territory. Anterior choroidal artery remains patent with some anterior-posterior flow through the choroidal system into the parietal territory. Left ACA: Initial imaging demonstrates no significant primary filling of the ACA territory. Completion Findings: Left MCA: After 2 passes there is complete restoration of flow through the ICA terminus, incomplete restoration of flow through the right MCA branches. Left ACA: There is restoration of flow into the A1 segment. The right ACA territory is partially served by left-sided ACA branches in the pericallosal distribution. TICI 3 flow restored Flat panel CT demonstrates no subarachnoid hemorrhage or significant contrast staining. PROCEDURE: The anesthesia team was present to provide  general endotracheal tube anesthesia and for patient monitoring during the procedure. Intubation was performed in negative pressure [place] in neuro IR holding. Interventional neuro radiology nursing staff was also present. Ultrasound survey of  the right inguinal region was performed with images stored and sent to PACs. 11 blade scalpel was used to make a small incision. Blunt dissection was performed with US guidance. A micropuncture needle was used access the right common femoral artery under ultrasound. With excellent arterial blood flow returned, an .018 micro wire was passed through the needle, observed to enter the abdominal aorta under fluoroscopy. The needle was removed, and a micropuncture sheath was placed over the wire. The inner dilator and wire were removed, and an 035 wire was advanced under fluoroscopy into the abdominal aorta. The sheath was removed and a 25cm 6F straight vascular sheath was placed. The dilator was removed and the sheath was flushed. Sheath was attached to pressurized and heparinized saline bag for constant forward flow. A coaxial system was then advanced over the 035 wire. This included a 95cm 087 "Walrus" balloon guide with coaxial 125cm Berenstein diagnostic catheter. This was advanced to the proximal descending thoracic aorta. Wire was then removed. Double flush of the catheter was performed. Catheter was then used to select the left common carotid artery. Angiogram was performed. The catheter and balloon guide combination was advanced over a standard glide wire into left cervical ICA, with distal position achieved of the balloon guide. The diagnostic catheter and the wire were removed. Formal angiogram was performed. Road map function was used once the occluded vessel was identified. Copious back flush was performed and the balloon catheter was attached to heparinized and pressurized saline bag for forward flow. A second coaxial system was then advanced through the balloon catheter,  which included the selected intermediate catheter, microcatheter, and microwire. In this scenario, the set up included a 71 zoom catheter, a Trevo Provue18 microcatheter, and 014 synchro soft wire. This system was advanced through the balloon guide catheter under the road-map function, with adequate back-flush at the rotating hemostatic valve at that back end of the balloon guide. Microcatheter and the intermediate catheter system were advanced through the siphon to the level of the occlusion. The microwire was passed through the occlusion into the proximal MCA. Aspiration catheter was then advanced through the ophthalmic segment. Microcatheter and the microwire were removed. The aspiration catheter was attached to the proprietary engine for aspiration confirming adequate backflow. Catheter was then advanced to the site of the occlusion, confirming stasis of backflow. 1 minutes time frame was observed. The catheter was then withdrawn into the ophthalmic segment under fluoroscopy. No significant flow was observed. The balloon on the balloon guide was gently inflated under fluoroscopy. Aspiration catheter was removed from the system while aspiration was performed at the balloon guide hub. The aspiration catheter was completely removed, adequate blood return was confirmed through the balloon guide, and then the balloon on the balloon guide was deflated. Repeat control angiogram was performed confirming continued occlusion at the ICA terminus. We elected to perform a 2nd pass. Coaxial system was again advanced through the balloon catheter, which included the selected intermediate catheter, microcatheter, and microwire. In this scenario, the set up included a CAT 6, a Trevo Provue18 microcatheter, and 014 synchro soft wire. This system was advanced through the balloon guide catheter under the road-map function, with adequate back-flush at the rotating hemostatic valve at that back end of the balloon guide. Blood was then  aspirated through the hub of the microcatheter, and a gentle contrast injection was performed confirming intraluminal position. A rotating hemostatic valve was then attached to the back end of the microcatheter, and a pressurized and heparinized saline  bag was attached to the catheter. 4 x 40 solitaire device was then selected. Back flush was achieved at the rotating hemostatic valve, and then the device was gently advanced through the microcatheter to the distal end. The retriever was then unsheathed by withdrawing the microcatheter under fluoroscopy. Once the retriever was completely unsheathed, the microcatheter was carefully stripped from the delivery device. Control angiogram was performed from the intermediate catheter. A 3 minute time interval was observed. The balloon at the balloon guide catheter was then inflated under fluoroscopy for proximal flow arrest. Constant aspiration using the proprietary engine was then performed at the intermediate catheter, as the retriever was gently and slowly withdrawn with fluoroscopic observation. Once the retriever was "corked" within the tip of the intermediate catheter, both were removed from the system. Free aspiration was confirmed at the hub of the balloon guide catheter, with free blood return confirmed. The balloon was then deflated, and a control angiogram was performed. Restoration of flow was confirmed. Angiogram of the cervical ICA was performed. Balloon guide was withdrawn into the carotid artery and repeat angiogram was performed. Balloon guide was then removed. The skin at the puncture site was then cleaned with Chlorhexidine. The 8 French sheath was removed and an 49F angioseal was deployed. Flat panel CT was performed. Patient was extubated once the CT was reviewed. Patient tolerated the procedure well and remained hemodynamically stable throughout. No complications were encountered and no significant blood loss encountered. IMPRESSION: Status post  ultrasound guided access right common femoral artery for left-sided cervical/cerebral angiogram and mechanical thrombectomy of ICA terminus occlusion restoring complete flow (TICI 3) into the ACA territory and MCA territory of the left hemisphere. Angio-Seal deployed for hemostasis. Signed, Dulcy Fanny. Dellia Nims, RPVI [facility] Radiology PLAN: The patient will remain intubated ICU status Target systolic blood pressure of 120-140 Right hip straight time 6 hours Frequent neurovascular checks Repeat neurologic imaging with CT and/MRI at the discretion of neurology team Electronically Signed   By: Corrie Mckusick D.O.   On: 12/27/2019 02:46   IR US Guide Vasc Access Right  Result Date: 12/27/2019 INDICATION: 54 year old female with a history of acute left MCA stroke, ELVO, presenting for mechanical thrombectomy EXAM: ULTRASOUND GUIDED ACCESS RIGHT COMMON FEMORAL ARTERY CERVICAL AND CEREBRAL ANGIOGRAM MECHANICAL THROMBECTOMY LEFT ICA ANGIO-SEAL FOR HEMOSTASIS COMPARISON:  CT IMAGING SAME DAY MEDICATIONS: NONE ANESTHESIA/SEDATION: The anesthesia team was present to provide general endotracheal tube anesthesia and for patient monitoring during the procedure. Intubation was performed in Neuro biplane room. Radial arterial line was performed by the anesthesia team. interventional neuro radiology nursing staff was also present. CONTRAST:  50 cc Omnipaque 300 FLUOROSCOPY TIME:  Fluoroscopy Time: 8 minutes 48 seconds (901 mGy). COMPLICATIONS: None TECHNIQUE: Informed written consent was obtained from the patient's family after a thorough discussion of the procedural risks, benefits and alternatives. Specific risks discussed include: Bleeding, infection, contrast reaction, kidney injury/failure, need for further procedure/surgery, arterial injury or dissection, embolization to new territory, intracranial hemorrhage (10-15% risk), neurologic deterioration, cardiopulmonary  collapse, death. All questions were addressed. Maximal Sterile Barrier Technique was utilized including during the procedure including caps, mask, sterile gowns, sterile gloves, sterile drape, hand hygiene and skin antiseptic. A timeout was performed prior to the initiation of the procedure. The anesthesia team was present to provide general endotracheal tube anesthesia and for patient monitoring during the procedure. Interventional neuro radiology nursing staff was also present. FINDINGS: Initial Findings: Left internal carotid artery: Initial angiogram demonstrates ICA  terminus occlusion, without significant plaque of the petrous segment, last seroma segment, cavernous segment or the proximal supraclinoid segment. Ophthalmic artery is patent, with choroid blush. Patent posterior communicating artery with flow into the left PCA. Left MCA: Initial imaging demonstrates no significant primary filling of the MCA. There are leptomeningeal collateral flows partially filling the superior division of the MCA territory. Anterior choroidal artery remains patent with some anterior-posterior flow through the choroidal system into the parietal territory. Left ACA: Initial imaging demonstrates no significant primary filling of the ACA territory. Completion Findings: Left MCA: After 2 passes there is complete restoration of flow through the ICA terminus, incomplete restoration of flow through the right MCA branches. Left ACA: There is restoration of flow into the A1 segment. The right ACA territory is partially served by left-sided ACA branches in the pericallosal distribution. TICI 3 flow restored Flat panel CT demonstrates no subarachnoid hemorrhage or significant contrast staining. PROCEDURE: The anesthesia team was present to provide general endotracheal tube anesthesia and for patient monitoring during the procedure. Intubation was performed in negative pressure [place] in neuro IR holding. Interventional neuro radiology nursing  staff was also present. Ultrasound survey of the right inguinal region was performed with images stored and sent to PACs. 11 blade scalpel was used to make a small incision. Blunt dissection was performed with US guidance. A micropuncture needle was used access the right common femoral artery under ultrasound. With excellent arterial blood flow returned, an .018 micro wire was passed through the needle, observed to enter the abdominal aorta under fluoroscopy. The needle was removed, and a micropuncture sheath was placed over the wire. The inner dilator and wire were removed, and an 035 wire was advanced under fluoroscopy into the abdominal aorta. The sheath was removed and a 25cm 48F straight vascular sheath was placed. The dilator was removed and the sheath was flushed. Sheath was attached to pressurized and heparinized saline bag for constant forward flow. A coaxial system was then advanced over the 035 wire. This included a 95cm 087 "Walrus" balloon guide with coaxial 125cm Berenstein diagnostic catheter. This was advanced to the proximal descending thoracic aorta. Wire was then removed. Double flush of the catheter was performed. Catheter was then used to select the left common carotid artery. Angiogram was performed. The catheter and balloon guide combination was advanced over a standard glide wire into left cervical ICA, with distal position achieved of the balloon guide. The diagnostic catheter and the wire were removed. Formal angiogram was performed. Road map function was used once the occluded vessel was identified. Copious back flush was performed and the balloon catheter was attached to heparinized and pressurized saline bag for forward flow. A second coaxial system was then advanced through the balloon catheter, which included the selected intermediate catheter, microcatheter, and microwire. In this scenario, the set up included a 71 zoom catheter, a Trevo Provue18 microcatheter, and 014 synchro soft  wire. This system was advanced through the balloon guide catheter under the road-map function, with adequate back-flush at the rotating hemostatic valve at that back end of the balloon guide. Microcatheter and the intermediate catheter system were advanced through the siphon to the level of the occlusion. The microwire was passed through the occlusion into the proximal MCA. Aspiration catheter was then advanced through the ophthalmic segment. Microcatheter and the microwire were removed. The aspiration catheter was attached to the proprietary engine for aspiration confirming adequate backflow. Catheter was then advanced to the site of the occlusion, confirming stasis of  backflow. 1 minutes time frame was observed. The catheter was then withdrawn into the ophthalmic segment under fluoroscopy. No significant flow was observed. The balloon on the balloon guide was gently inflated under fluoroscopy. Aspiration catheter was removed from the system while aspiration was performed at the balloon guide hub. The aspiration catheter was completely removed, adequate blood return was confirmed through the balloon guide, and then the balloon on the balloon guide was deflated. Repeat control angiogram was performed confirming continued occlusion at the ICA terminus. We elected to perform a 2nd pass. Coaxial system was again advanced through the balloon catheter, which included the selected intermediate catheter, microcatheter, and microwire. In this scenario, the set up included a CAT 6, a Trevo Provue18 microcatheter, and 014 synchro soft wire. This system was advanced through the balloon guide catheter under the road-map function, with adequate back-flush at the rotating hemostatic valve at that back end of the balloon guide. Blood was then aspirated through the hub of the microcatheter, and a gentle contrast injection was performed confirming intraluminal position. A rotating hemostatic valve was then attached to the back end  of the microcatheter, and a pressurized and heparinized saline bag was attached to the catheter. 4 x 40 solitaire device was then selected. Back flush was achieved at the rotating hemostatic valve, and then the device was gently advanced through the microcatheter to the distal end. The retriever was then unsheathed by withdrawing the microcatheter under fluoroscopy. Once the retriever was completely unsheathed, the microcatheter was carefully stripped from the delivery device. Control angiogram was performed from the intermediate catheter. A 3 minute time interval was observed. The balloon at the balloon guide catheter was then inflated under fluoroscopy for proximal flow arrest. Constant aspiration using the proprietary engine was then performed at the intermediate catheter, as the retriever was gently and slowly withdrawn with fluoroscopic observation. Once the retriever was "corked" within the tip of the intermediate catheter, both were removed from the system. Free aspiration was confirmed at the hub of the balloon guide catheter, with free blood return confirmed. The balloon was then deflated, and a control angiogram was performed. Restoration of flow was confirmed. Angiogram of the cervical ICA was performed. Balloon guide was withdrawn into the carotid artery and repeat angiogram was performed. Balloon guide was then removed. The skin at the puncture site was then cleaned with Chlorhexidine. The 8 French sheath was removed and an 28F angioseal was deployed. Flat panel CT was performed. Patient was extubated once the CT was reviewed. Patient tolerated the procedure well and remained hemodynamically stable throughout. No complications were encountered and no significant blood loss encountered. IMPRESSION: Status post ultrasound guided access right common femoral artery for left-sided cervical/cerebral angiogram and mechanical thrombectomy of ICA terminus occlusion restoring complete flow (TICI 3) into the ACA  territory and MCA territory of the left hemisphere. Angio-Seal deployed for hemostasis. Signed, Dulcy Fanny. Dellia Nims, RPVI [facility] Radiology Special[facility] Radiology PLAN: The patient will remain intubated ICU status Target systolic blood pressure of 120-140 Right hip straight time 6 hours Frequent neurovascular checks Repeat neurologic imaging with CT and/MRI at the discretion of neurology team Electronically Signed   By: Corrie Mckusick D.O.   On: 12/27/2019 02:46   CT CEREBRAL PERFUSION W CONTRAST  Result Date: 12/27/2019 CLINICAL DATA:  Initial evaluation for acute stroke, unresponsiveness. EXAM: CT ANGIOGRAPHY HEAD AND NECK CT PERFUSION BRAIN TECHNIQUE: Multidetector CT imaging of the head and neck was performed using the standard protocol during  bolus administration of intravenous contrast. Multiplanar CT image reconstructions and MIPs were obtained to evaluate the vascular anatomy. Carotid stenosis measurements (when applicable) are obtained utilizing NASCET criteria, using the distal internal carotid diameter as the denominator. Multiphase CT imaging of the brain was performed following IV bolus contrast injection. Subsequent parametric perfusion maps were calculated using RAPID software. CONTRAST:  173mL OMNIPAQUE IOHEXOL 350 MG/ML SOLN COMPARISON:  Prior head CT from earlier the same day. FINDINGS: CTA NECK FINDINGS Aortic arch: Visualized aortic arch of normal caliber with normal branch pattern. Mild atheromatous change about the arch and origin of the great vessels without stenosis. Visualized subclavian arteries widely patent. Right carotid system: Right common and internal carotid arteries widely patent without stenosis, dissection or occlusion. Left carotid system: Left common carotid artery patent from its origin to the bifurcation without stenosis. Mild atheromatous change about the left bifurcation without significant stenosis. Left ICA widely patent distally to the  skull base without stenosis, dissection or occlusion. Vertebral arteries: Both vertebral arteries arise from the subclavian arteries. Right vertebral artery dominant, with a diffusely hypoplastic left vertebral artery. Multifocal moderate to severe segmental stenoses seen involving the hypoplastic left vertebral artery, most pronounced proximally. On the right, there is a single short-segment severe stenosis involving the right V1 segment (series 4, image 272). Otherwise, right vertebral artery patent within the neck without stenosis or other abnormality. Skeleton: No acute osseous finding. Focal sclerotic lesion within the T2 vertebral body noted, nonspecific, but likely benign. No other worrisome lytic or blastic osseous lesions. Other neck: No other acute soft tissue abnormality within the neck. No mass lesion or adenopathy. 7 mm left thyroid nodule noted, felt to be of doubtful significance given size and patient age. No follow-up imaging recommended regarding this lesion. Upper chest: Visualized upper chest demonstrates no acute finding. Partially visualized lungs are largely clear. Review of the MIP images confirms the above findings CTA HEAD FINDINGS Anterior circulation: On the right, the petrous, cavernous, and supraclinoid segments are widely patent to the terminus. 5 x 6 x 7 mm saccular aneurysm seen arising from the para clinoid right ICA (series 4, image 116). This is directed medially and posteriorly. On the left, patent but somewhat attenuated flow within the petrous and cavernous left ICA, with subsequent occlusion at the supraclinoid segment/left ICA terminus (series 4, image 115). A1 segments are opacified bilaterally, with filling of the left A1 via collateralization across the circle-of-Willis. A2 segments well perfused to their distal aspects. Opacification of the mid and distal left M1 segment is seen, which could be related to subocclusive thrombus and/or collateralization. Normal left MCA  bifurcation. Attenuated flow seen within the left MCA branches distally, likely via collateralization. Right M1 widely patent. Normal right MCA bifurcation. Distal right MCA branches well perfused. Small vessel atheromatous irregularity noted. Posterior circulation: Vertebral arteries patent to the vertebrobasilar junction without stenosis. Both picas patent. Basilar diminutive but patent to its distal aspect without stenosis. Superior cerebral arteries patent at their origins. Both PCAs primarily supplied via the basilar well perfused to their distal aspects. Venous sinuses: Grossly patent allowing for timing of the contrast bolus. Anatomic variants: None significant. Review of the MIP images confirms the above findings CT Brain Perfusion Findings: ASPECTS: 6 CBF (<30%) Volume: 50mL Perfusion (Tmax>6.0s) volume: 266mL Mismatch Volume: 266mL Infarction Location:Patchy core infarct seen involving the deep and subcortical white matter of the anterior left frontal lobe. Large surrounding ischemic penumbra throughout the majority of the left MCA distribution. IMPRESSION: CTA HEAD  AND NECK IMPRESSION: 1. Positive CTA with occlusive thrombus at the left ICA terminus. Distal reconstitution at the mid and distal left M1 segment with attenuated flow distally within the left MCA branches, which could be related to subocclusive thrombus and/or collateralization. 2. Mild atheromatous change about the left carotid bifurcation without stenosis. No other hemodynamically significant stenosis involving the left carotid system. 3. Multifocal moderate to severe stenoses involving the hypoplastic left vertebral artery, most notable proximally. Single short-segment severe proximal right V1 stenosis. Dominant right vertebral artery otherwise widely patent. 4. 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 CT PERFUSION IMPRESSION: 1. 24 cc acute core infarct involving the deep and subcortical white matter of the anterior  left frontal region. Two hundred fourteen cc surrounding ischemic penumbra involving essentially the entirety of the left MCA distribution. 2. Aspects = 6 on prior head CT. Critical Value/emergent results were called by telephone at the time of interpretation on 12/26/2019 at 11:48 pm to provider Dr. Tomi Bamberger, Who verbally acknowledged these results. Critical Value/emergent results were called by telephone at the time of interpretation on 12/26/2019 at 11:54 pm to provider Dr. Cheral Marker, Who verbally acknowledged these results. Electronically Signed   By: Jeannine Boga M.D.   On: 12/27/2019 00:41   DG Chest Port 1 View  Result Date: 12/27/2019 CLINICAL DATA:  Endotracheal tube placement EXAM: PORTABLE CHEST 1 VIEW COMPARISON:  Dec 26, 2019 FINDINGS: The endotracheal tube terminates above the carina by approximately 2.9 cm. The enteric tube extends below the left hemidiaphragm. There is postoperative atelectasis at the lung bases. There is no pneumothorax. The heart size is stable but enlarged. There is no acute osseous abnormality. IMPRESSION: 1. Lines and tubes as above. 2. Postoperative atelectasis at the lung bases. 3. Cardiomegaly. Electronically Signed   By: Constance Holster M.D.   On: 12/27/2019 03:44   DG Chest Port 1 View  Result Date: 12/26/2019 CLINICAL DATA:  Weakness, altered mental status, could stroke EXAM: PORTABLE CHEST 1 VIEW COMPARISON:  CTa 10/31/2019, radiograph 10/31/2019 FINDINGS: Low volumes and central vascular crowding as well as slight prominence of the central pulmonary arteries which may reflect pulmonary artery hypertension. Streaky opacities favor atelectasis in the setting. No focal consolidative process. No pneumothorax or effusion. Cardiac silhouette is enlarged, similar to priors. Remaining cardiomediastinal contours are unremarkable. No acute osseous or soft tissue abnormality. Telemetry leads overlie the chest. IMPRESSION: Low lung volumes with streaky opacities favoring  atelectasis. Electronically Signed   By: Lovena Le M.D.   On: 12/26/2019 23:13   IR PERCUTANEOUS ART THROMBECTOMY/INFUSION INTRACRANIAL INC DIAG ANGIO  Result Date: 12/27/2019 INDICATION: 54 year old female with a history of acute left MCA stroke, ELVO, presenting for mechanical thrombectomy EXAM: ULTRASOUND GUIDED ACCESS RIGHT COMMON FEMORAL ARTERY CERVICAL AND CEREBRAL ANGIOGRAM MECHANICAL THROMBECTOMY LEFT ICA ANGIO-SEAL FOR HEMOSTASIS COMPARISON:  CT IMAGING SAME DAY MEDICATIONS: NONE ANESTHESIA/SEDATION: The anesthesia team was present to provide general endotracheal tube anesthesia and for patient monitoring during the procedure. Intubation was performed in Neuro biplane room. Radial arterial line was performed by the anesthesia team. interventional neuro radiology nursing staff was also present. CONTRAST:  50 cc Omnipaque 300 FLUOROSCOPY TIME:  Fluoroscopy Time: 8 minutes 48 seconds (901 mGy). COMPLICATIONS: None TECHNIQUE: Informed written consent was obtained from the patient's family after a thorough discussion of the procedural risks, benefits and alternatives. Specific risks discussed include: Bleeding, infection, contrast reaction, kidney injury/failure, need for further procedure/surgery, arterial injury or dissection, embolization to new  territory, intracranial hemorrhage (10-15% risk), neurologic deterioration, cardiopulmonary collapse, death. All questions were addressed. Maximal Sterile Barrier Technique was utilized including during the procedure including caps, mask, sterile gowns, sterile gloves, sterile drape, hand hygiene and skin antiseptic. A timeout was performed prior to the initiation of the procedure. The anesthesia team was present to provide general endotracheal tube anesthesia and for patient monitoring during the procedure. Interventional neuro radiology nursing staff was also present. FINDINGS: Initial Findings: Left internal carotid artery: Initial angiogram demonstrates ICA  terminus occlusion, without significant plaque of the petrous segment, last seroma segment, cavernous segment or the proximal supraclinoid segment. Ophthalmic artery is patent, with choroid blush. Patent posterior communicating artery with flow into the left PCA. Left MCA: Initial imaging demonstrates no significant primary filling of the MCA. There are leptomeningeal collateral flows partially filling the superior division of the MCA territory. Anterior choroidal artery remains patent with some anterior-posterior flow through the choroidal system into the parietal territory. Left ACA: Initial imaging demonstrates no significant primary filling of the ACA territory. Completion Findings: Left MCA: After 2 passes there is complete restoration of flow through the ICA terminus, incomplete restoration of flow through the right MCA branches. Left ACA: There is restoration of flow into the A1 segment. The right ACA territory is partially served by left-sided ACA branches in the pericallosal distribution. TICI 3 flow restored Flat panel CT demonstrates no subarachnoid hemorrhage or significant contrast staining. PROCEDURE: The anesthesia team was present to provide general endotracheal tube anesthesia and for patient monitoring during the procedure. Intubation was performed in negative pressure [place] in neuro IR holding. Interventional neuro radiology nursing staff was also present. Ultrasound survey of the right inguinal region was performed with images stored and sent to PACs. 11 blade scalpel was used to make a small incision. Blunt dissection was performed with US guidance. A micropuncture needle was used access the right common femoral artery under ultrasound. With excellent arterial blood flow returned, an .018 micro wire was passed through the needle, observed to enter the abdominal aorta under fluoroscopy. The needle was removed, and a micropuncture sheath was placed over the wire. The inner dilator and wire were  removed, and an 035 wire was advanced under fluoroscopy into the abdominal aorta. The sheath was removed and a 25cm 107F straight vascular sheath was placed. The dilator was removed and the sheath was flushed. Sheath was attached to pressurized and heparinized saline bag for constant forward flow. A coaxial system was then advanced over the 035 wire. This included a 95cm 087 "Walrus" balloon guide with coaxial 125cm Berenstein diagnostic catheter. This was advanced to the proximal descending thoracic aorta. Wire was then removed. Double flush of the catheter was performed. Catheter was then used to select the left common carotid artery. Angiogram was performed. The catheter and balloon guide combination was advanced over a standard glide wire into left cervical ICA, with distal position achieved of the balloon guide. The diagnostic catheter and the wire were removed. Formal angiogram was performed. Road map function was used once the occluded vessel was identified. Copious back flush was performed and the balloon catheter was attached to heparinized and pressurized saline bag for forward flow. A second coaxial system was then advanced through the balloon catheter, which included the selected intermediate catheter, microcatheter, and microwire. In this scenario, the set up included a 71 zoom catheter, a Trevo Provue18 microcatheter, and 014 synchro soft wire. This system was advanced through the balloon guide catheter under the road-map function,  with adequate back-flush at the rotating hemostatic valve at that back end of the balloon guide. Microcatheter and the intermediate catheter system were advanced through the siphon to the level of the occlusion. The microwire was passed through the occlusion into the proximal MCA. Aspiration catheter was then advanced through the ophthalmic segment. Microcatheter and the microwire were removed. The aspiration catheter was attached to the proprietary engine for aspiration  confirming adequate backflow. Catheter was then advanced to the site of the occlusion, confirming stasis of backflow. 1 minutes time frame was observed. The catheter was then withdrawn into the ophthalmic segment under fluoroscopy. No significant flow was observed. The balloon on the balloon guide was gently inflated under fluoroscopy. Aspiration catheter was removed from the system while aspiration was performed at the balloon guide hub. The aspiration catheter was completely removed, adequate blood return was confirmed through the balloon guide, and then the balloon on the balloon guide was deflated. Repeat control angiogram was performed confirming continued occlusion at the ICA terminus. We elected to perform a 2nd pass. Coaxial system was again advanced through the balloon catheter, which included the selected intermediate catheter, microcatheter, and microwire. In this scenario, the set up included a CAT 6, a Trevo Provue18 microcatheter, and 014 synchro soft wire. This system was advanced through the balloon guide catheter under the road-map function, with adequate back-flush at the rotating hemostatic valve at that back end of the balloon guide. Blood was then aspirated through the hub of the microcatheter, and a gentle contrast injection was performed confirming intraluminal position. A rotating hemostatic valve was then attached to the back end of the microcatheter, and a pressurized and heparinized saline bag was attached to the catheter. 4 x 40 solitaire device was then selected. Back flush was achieved at the rotating hemostatic valve, and then the device was gently advanced through the microcatheter to the distal end. The retriever was then unsheathed by withdrawing the microcatheter under fluoroscopy. Once the retriever was completely unsheathed, the microcatheter was carefully stripped from the delivery device. Control angiogram was performed from the intermediate catheter. A 3 minute time interval  was observed. The balloon at the balloon guide catheter was then inflated under fluoroscopy for proximal flow arrest. Constant aspiration using the proprietary engine was then performed at the intermediate catheter, as the retriever was gently and slowly withdrawn with fluoroscopic observation. Once the retriever was "corked" within the tip of the intermediate catheter, both were removed from the system. Free aspiration was confirmed at the hub of the balloon guide catheter, with free blood return confirmed. The balloon was then deflated, and a control angiogram was performed. Restoration of flow was confirmed. Angiogram of the cervical ICA was performed. Balloon guide was withdrawn into the carotid artery and repeat angiogram was performed. Balloon guide was then removed. The skin at the puncture site was then cleaned with Chlorhexidine. The 8 French sheath was removed and an 43F angioseal was deployed. Flat panel CT was performed. Patient was extubated once the CT was reviewed. Patient tolerated the procedure well and remained hemodynamically stable throughout. No complications were encountered and no significant blood loss encountered. IMPRESSION: Status post ultrasound guided access right common femoral artery for left-sided cervical/cerebral angiogram and mechanical thrombectomy of ICA terminus occlusion restoring complete flow (TICI 3) into the ACA territory and MCA territory of the left hemisphere. Angio-Seal deployed for hemostasis. Signed, Dulcy Fanny. Dellia Nims, RPVI [facility] Radiology PLAN: The patient will remain intubated ICU status  Target systolic blood pressure of 120-140 Right hip straight time 6 hours Frequent neurovascular checks Repeat neurologic imaging with CT and/MRI at the discretion of neurology team Electronically Signed   By: Corrie Mckusick D.O.   On: 12/27/2019 02:46   CT HEAD CODE STROKE WO CONTRAST  Result Date: 12/26/2019 CLINICAL DATA:   Code stroke. Initial evaluation for acute unresponsiveness. EXAM: CT HEAD WITHOUT CONTRAST TECHNIQUE: Contiguous axial images were obtained from the base of the skull through the vertex without intravenous contrast. COMPARISON:  CT prior head CT from 08/19/2016. FINDINGS: Brain: Examination somewhat technically limited by motion artifact with streak artifact through the skull base. There is question of asymmetric hypodensity involving the left basal ganglia, with involvement of the left caudate and lentiform nuclei. Additionally, the left insula and adjacent left temporal lobe appear hypodense as well. No associated mass effect. No intracranial hemorrhage. No other evidence for acute large vessel territory infarct. No mass lesion or midline shift. No hydrocephalus or extra-axial fluid collection. Vascular: Question asymmetric hyperdensity at the left ICA terminus (series 2, image 11). No other hyperdense vessel. Skull: Scalp soft tissues within normal limits.  Calvarium intact. Sinuses/Orbits: Globes and orbital soft tissues demonstrate no acute finding. Paranasal sinuses are clear. No mastoid effusion. Other: None. ASPECTS [facility] Stroke Program Early CT Score) - Ganglionic level infarction (caudate, lentiform nuclei, internal capsule, insula, M1-M3 cortex): 3 - Supraganglionic infarction (M4-M6 cortex): 3 Total score (0-10 with 10 being normal): 6 IMPRESSION: 1. Question evolving hypodensity involving the left basal ganglia, left insula, and adjacent left temporal lobe, which could reflect an evolving left MCA territory infarct. Additionally, there is question of asymmetric hyperdensity at the left ICA terminus, which could reflect intraluminal thrombus/LVO. Finding somewhat difficult to be certain given at there is some motion and streak artifact on this exam. Further assessment with dedicated CTA of the head and neck recommended. 2. ASPECTS is 6. Critical Value/emergent results were called by telephone at the  time of interpretation on 12/26/2019 at 11:13 pm to provider JOSEPH ZAMMIT , who verbally acknowledged these results. Electronically Signed   By: Jeannine Boga M.D.   On: 12/26/2019 23:18    Labs:  CBC: Recent Labs    05/11/19 0530 10/31/19 0635 12/26/19 2254 12/27/19 0305  WBC 6.7 7.6 8.1 7.9  HGB 12.6 13.1 13.5 13.3  HCT 40.0 40.9 42.0 41.6  PLT 238 252 221 217    COAGS: Recent Labs    12/26/19 2255  INR 1.2  APTT 23*    BMP: Recent Labs    06/28/19 1000 10/31/19 0635 12/26/19 2254 12/27/19 0305  NA 137 137 136 136  K 3.9 4.2 3.9 4.9  CL 97* 101 104 106  CO2 25 26 21* 20*  GLUCOSE 302* 338* 355* 345*  BUN 20 21* 15 13  CALCIUM 9.0 9.4 8.7* 8.3*  CREATININE 1.36* 1.19* 1.15* 0.95  GFRNONAA 45* 52* 54* >60  GFRAA 52* >60 >60 >60    LIVER FUNCTION TESTS: Recent Labs    04/19/19 1235 05/10/19 0848 12/26/19 2254  BILITOT 0.8 0.8 0.5  AST 29 18 36  ALT 37 18 26  ALKPHOS 149* 117 87  PROT 6.6 6.9 7.0  ALBUMIN 3.4* 3.6 3.7    Assessment and Plan:  History of acute CVA s/p cerebral arteriogram with emergent mechanical thrombectomy of left ICA terminus occlusion achieving a TICI 3 revascularization via right femoral approach 12/27/2019 by Dr. Earleen Newport. Patient's condition stable- remains intubated without sedation, does not open  eyes to voice/painful stimuli and does not follow simple commands, can spontaneously move left side, RLE withdraws from pain, no movements of RUE. Right groin incision stable, distal pulses (DPs) 1+ bilaterally. For possible MR brain today. Further plans per neurology/CCM- appreciate and agree with management. NIR to follow.   Electronically Signed: Earley Abide, PA-C 12/27/2019, 10:31 AM   I spent a total of 25 Minutes at the the patient's bedside AND on the patient's hospital floor or unit, greater than 50% of which was counseling/coordinating care for CVA s/p revascularization.

## 2019-12-27 NOTE — ED Notes (Signed)
[name] contacted [facility] regarding inability to give report at this time. Will continue to try. Pt left building at [employer]

## 2019-12-27 NOTE — Progress Notes (Addendum)
NeuroInterventional Radiology  Pre-Procedure Note  History: 54 yo female presents with acute stroke symptoms to [facility] ED.    Neurology/teleneurology work-up reveals ELVO of left M1 occlusion.  54 y/o woman with h/o HTN, DM, and atrial fibrillation (on Elliquis - unknown last dose). who presents to the ED with AMS and right arm and facial weakness. Neuro-IR to be consulted for LVO.  Baseline mRS: 0 NIHSS:   27  CT ASPECTS: 6 CTA:   Left ICA terminus occlusion/proximal M1 CTP:   24cc core, with >200cc penumbra   Discussed with Dr. Cheral Marker of Stroke Neurology.  Given the patient's symptoms, imaging findings, baseline function, I agree they are an appropriate candidate for attempt at mechanical thrombectomy.    The risks and benefits of the procedure were discussed via 3-way telephone by Dr. Earleen Newport and Dr. Cheral Marker with the patient's sister Ms [name] Jake Shark [phone], with specific risks including: bleeding, infection, arterial injury/dissection, contrast reaction, kidney injury, need for further procedure/surgery, neurologic deficit, 10-15% risk of intracranial hemorrhage, cardiopulmonary collapse, death. All questions were answered.  The patient/family would like to proceed with attempt at thrombectomy.      Plan for cerebral angiogram and attempt at mechanical thrombectomy.   Signed,   Dulcy Fanny. Earleen Newport, DO

## 2019-12-27 NOTE — H&P (Signed)
Admission H&P    Chief Complaint: Acute onset of right hemiplegia  HPI: Robin [name] is an 54 y.o. female with a history of DM, HTN, atrial fibrillation on Eliquis, anemia, CHF, COPD and polysubstance abuse, presenting in transfer from [facility] ED for acute left MCA stroke. She initially presented to [facility] acutely after being found unresponsive at home after family heard her fall. EMS was called and on arrival she was altered, with CBG of 379. LKN 2200. Narcan 2 mg was administered by EMS with patient becoming slightly more responsive, but then she declined and was noted to be weak on her right side. Code Stroke was called on arrival to [facility].   CT head showed no hemorrhage, but there was evolving hypodensity in the left MCA territory with ASPECTS of 6.   Teleneurology was consulted and she was diagnosed with an acute left MCA stroke with NIHSS of 27.    CTA of head revealed a left ICA terminus occlusion.   CTA of neck showed no LVO in the arteries of the neck on preliminary review  CTP revealed a 24 cc core infarct with 214 cc penumbra involving the left MCA territory.   She was emergently transported to [facility] for thrombectomy.   LSN: [id number] tPA Given: No: On Eliquis   Past Medical History:  Diagnosis Date  . Allergy   . Anemia   . Arthritis   . CHF (congestive heart failure) ([place])    a. EF at 30-35% by echo in 08/2018 b. EF at 35% by repeat echo in 05/2019  . Chronic abdominal pain   . Cocaine abuse ([place])   . COPD (chronic obstructive pulmonary disease) ([place])   . Essential hypertension, benign   . Fatty liver   . GERD (gastroesophageal reflux disease)   . Gout 2016  . Normal coronary arteries    3/10 - following abnormal Myoview  . Ovarian cyst   . Type 2 diabetes mellitus ([place])     Past Surgical History:  Procedure Laterality Date  . ABDOMINAL HYSTERECTOMY  09/10/2011   Procedure: HYSTERECTOMY ABDOMINAL;  Surgeon: Jonnie Kind, MD;  Location: [facility];  Service: Gynecology;   Laterality: N/A;  Abdominal hysterectomy  . CESAREAN SECTION  [id number], and 1994  . CHOLECYSTECTOMY  1995  . SCAR REVISION  09/10/2011   Procedure: SCAR REVISION;  Surgeon: Jonnie Kind, MD;  Location: [facility];  Service: Gynecology;  Laterality: N/A;  Wide Excision of old Cicatrix  . TUBAL LIGATION  1994    Family History  Problem Relation Age of Onset  . Cirrhosis Mother   . Early death Mother   . Alcohol abuse Mother   . Diabetes type II Father   . Alcohol abuse Father   . Diabetes type II Sister   . Early death Brother   . Alcohol abuse Son   . Anesthesia problems Neg Hx   . Hypotension Neg Hx   . Malignant hyperthermia Neg Hx   . Pseudochol deficiency Neg Hx   . Colon cancer Neg Hx   . Colon polyps Neg Hx   . Esophageal cancer Neg Hx   . Rectal cancer Neg Hx   . Stomach cancer Neg Hx    Social History:  reports that she has quit smoking. Her smoking use included cigarettes. She has a 7.25 pack-year smoking history. She has never used smokeless tobacco. She reports current alcohol use. She reports previous drug use. Drugs: Cocaine and Marijuana.  Allergies:  Allergies  Allergen Reactions  . Bee Venom Shortness Of Breath and Swelling    Bodily Swelling  . Losartan Other (See Comments)    Nosebleeds per patient report.   . Naproxen Other (See Comments)    Effects acid reflux  . Penicillins Nausea Only    Has patient had a PCN reaction causing immediate rash, facial/tongue/throat swelling, SOB or lightheadedness with hypotension: no Has patient had a PCN reaction causing severe rash involving mucus membranes or skin necrosis: no Has patient had a PCN reaction that required hospitalization no Has patient had a PCN reaction occurring within the last 10 years: no If all of the above answers are "NO", then may proceed with Cephalosporin   . Lisinopril     Sinus congestion    Medications Prior to Admission  Medication Sig Dispense Refill  . acetaminophen (TYLENOL) 325  MG tablet Take 2 tablets (650 mg total) by mouth every 6 (six) hours as needed for mild pain, fever or headache (or Fever >/= 101). 12 tablet 0  . albuterol (PROVENTIL) (2.5 MG/3ML) 0.083% nebulizer solution Take 3 mLs (2.5 mg total) by nebulization every 6 (six) hours as needed for wheezing or shortness of breath. 75 mL 5  . albuterol (VENTOLIN HFA) 108 (90 Base) MCG/ACT inhaler Inhale 2 puffs into the lungs every 6 (six) hours as needed for wheezing or shortness of breath. 18 g 5  . apixaban (ELIQUIS) 5 MG TABS tablet Take 1 tablet (5 mg total) by mouth 2 (two) times daily. 60 tablet 5  . carvedilol (COREG) 25 MG tablet Take 2 tablets (50 mg total) by mouth 2 (two) times daily with a meal. 120 tablet 6  . cephALEXin (KEFLEX) 500 MG capsule Take 1 capsule (500 mg total) by mouth 3 (three) times daily. 15 capsule 0  . digoxin (LANOXIN) 0.125 MG tablet Take 1 tablet (0.125 mg total) by mouth daily. 30 tablet 5  . Dulaglutide (TRULICITY) A999333 0000000 SOPN Inject 0.75 mg into the skin once a week. 4 pen 3  . gabapentin (NEURONTIN) 100 MG capsule Take 1 capsule (100 mg total) by mouth 3 (three) times daily. 90 capsule 5  . glucose blood (CONTOUR NEXT TEST) test strip 2x daily 100 each 12  . hydrALAZINE (APRESOLINE) 50 MG tablet Take 1 tablet (50 mg total) by mouth 3 (three) times daily. 90 tablet 5  . insulin aspart protamine - aspart (NOVOLOG MIX 70/30 FLEXPEN) (70-30) 100 UNIT/ML FlexPen Inject 0.3 mLs (30 Units total) into the skin 2 (two) times daily. (Patient taking differently: Inject into the skin 2 (two) times daily. 20 units in am and 30 in the pm) 30 mL 6  . Insulin Pen Needle 32G X 4 MM MISC 1 Device by Does not apply route as directed. 100 each 6  . metFORMIN (GLUCOPHAGE) 1000 MG tablet Take 1 tablet (1,000 mg total) by mouth daily with breakfast. 90 tablet 3  . pantoprazole (PROTONIX) 40 MG tablet Take 1 tablet (40 mg total) by mouth daily. 30 tablet 2  . potassium chloride SA (KLOR-CON) 20  MEQ tablet Take 1 tablet (20 mEq total) by mouth 2 (two) times daily. 60 tablet 3  . tizanidine (ZANAFLEX) 2 MG capsule Take 1 capsule (2 mg total) by mouth 2 (two) times daily as needed for muscle spasms. 15 capsule 0  . torsemide (DEMADEX) 20 MG tablet Take 1 tablet (20 mg total) by mouth 2 (two) times daily. 60 tablet 5  . triamcinolone cream (KENALOG) 0.1 % Apply  to affected area one to times daily 45 g 0    ROS: Unable to obtain due to obtundation  Physical Examination: Blood pressure (!) 186/103, pulse (!) 111, temperature (!) 96.7 F (35.9 C), temperature source Rectal, resp. rate (!) 23, height 5\' 6"  (1.676 m), weight 98.9 kg, last menstrual period 08/21/2011, SpO2 99 %.  HEENT-  [place]/AT  Lungs - Respirations unlabored Extremities - No edema  Neurologic Examination: Ment: Obtunded. Not following any commands or answering any questions. No spontaneous verbalizations noted. No attempts to communicate.  CN: PERRL. No blink to threat on the left or right. Forced deviation of eyes to the left. Unable to overcome with oculocephalic maneuver. No nystagmus. Decreased responsiveness to right brow ridge pressure. Right facial droop. No response to voice. Cough reflex intact. Head preferentially rotated to the left. Unable to assess tongue protrusion.  Motor: RUE flaccid with no movement to any stimuli - 0/5 RLE withdraws weakly to noxious. No purposeful movement. Does not raise antigravity.  LUE and LLE move in response to noxious semipurposefully, with >/= 3/5 strength.  Sensory: No response to noxious RUE. Responses to noxious RLE, LUE and LLE.  Reflexes: Unable to obtain definitive reflex exam as intubation procedure is being initiated.  Cerebellar/Gait: Unable to assess.  Results for orders placed or performed during the hospital encounter of 12/26/19 (from the past 48 hour(s))  CBG monitoring, ED     Status: Abnormal   Collection Time: 12/26/19 10:47 PM  Result Value Ref Range    Glucose-Capillary 319 (H) 70 - 99 mg/dL    Comment: Glucose reference range applies only to samples taken after fasting for at least 8 hours.  Urinalysis, Routine w reflex microscopic     Status: Abnormal   Collection Time: 12/26/19 10:49 PM  Result Value Ref Range   Color, Urine YELLOW YELLOW   APPearance CLEAR CLEAR   Specific Gravity, Urine 1.020 1.005 - 1.030   pH 6.0 5.0 - 8.0   Glucose, UA >=500 (A) NEGATIVE mg/dL   Hgb urine dipstick NEGATIVE NEGATIVE   Bilirubin Urine NEGATIVE NEGATIVE   Ketones, ur NEGATIVE NEGATIVE mg/dL   Protein, ur 100 (A) NEGATIVE mg/dL   Nitrite NEGATIVE NEGATIVE   Leukocytes,Ua TRACE (A) NEGATIVE   RBC / HPF 0-5 0 - 5 RBC/hpf   WBC, UA >50 (H) 0 - 5 WBC/hpf   Bacteria, UA NONE SEEN NONE SEEN   Squamous Epithelial / LPF 0-5 0 - 5    Comment: Performed at [facility], [address], [place], [place] [address]  Urine rapid drug screen (hosp performed)     Status: Abnormal   Collection Time: 12/26/19 10:49 PM  Result Value Ref Range   Opiates NONE DETECTED NONE DETECTED   Cocaine POSITIVE (A) NONE DETECTED   Benzodiazepines NONE DETECTED NONE DETECTED   Amphetamines NONE DETECTED NONE DETECTED   Tetrahydrocannabinol NONE DETECTED NONE DETECTED   Barbiturates NONE DETECTED NONE DETECTED    Comment: (NOTE) DRUG SCREEN FOR MEDICAL PURPOSES ONLY.  IF CONFIRMATION IS NEEDED FOR ANY PURPOSE, NOTIFY LAB WITHIN 5 DAYS. LOWEST DETECTABLE LIMITS FOR URINE DRUG SCREEN Drug Class                     Cutoff (ng/mL) Amphetamine and metabolites    1000 Barbiturate and metabolites    200 Benzodiazepine                 A999333 Tricyclics and metabolites     300  Opiates and metabolites        300 Cocaine and metabolites        300 THC                            50 Performed at [facility], [address], [place], [place] [address]   Pregnancy, urine     Status: None   Collection Time: 12/26/19 10:49 PM  Result Value Ref Range   Preg Test, Ur NEGATIVE  NEGATIVE    Comment:        THE SENSITIVITY OF THIS METHODOLOGY IS >20 mIU/mL. Performed at [facility], [address], [place], [place] [address]   SARS Coronavirus 2 by RT PCR (hospital order, performed in [facility] hospital lab) Nasopharyngeal Nasopharyngeal Swab     Status: None   Collection Time: 12/26/19 10:52 PM   Specimen: Nasopharyngeal Swab  Result Value Ref Range   SARS Coronavirus 2 NEGATIVE NEGATIVE    Comment: (NOTE) SARS-CoV-2 target nucleic acids are NOT DETECTED. The SARS-CoV-2 RNA is generally detectable in upper and lower respiratory specimens during the acute phase of infection. The lowest concentration of SARS-CoV-2 viral copies this assay can detect is 250 copies / mL. A negative result does not preclude SARS-CoV-2 infection and should not be used as the sole basis for treatment or other patient management decisions.  A negative result may occur with improper specimen collection / handling, submission of specimen other than nasopharyngeal swab, presence of viral mutation(s) within the areas targeted by this assay, and inadequate number of viral copies (<250 copies / mL). A negative result must be combined with clinical observations, patient history, and epidemiological information. Fact Sheet for Patients:   StrictlyIdeas.no Fact Sheet for Healthcare Providers: BankingDealers.co.za This test is not yet approved or cleared  by the [place] FDA and has been authorized for detection and/or diagnosis of SARS-CoV-2 by FDA under an Emergency Use Authorization (EUA).  This EUA will remain in effect (meaning this test can be used) for the duration of the COVID-19 declaration under Section 564(b)(1) of the Act, 21 U.S.C. section 360bbb-3(b)(1), unless the authorization is terminated or revoked sooner. Performed at [facility], [address] [place], [place] [address]   CBC with Differential/Platelet     Status:  None   Collection Time: 12/26/19 10:54 PM  Result Value Ref Range   WBC 8.1 4.0 - 10.5 K/uL   RBC 4.58 3.87 - 5.11 MIL/uL   Hemoglobin 13.5 12.0 - 15.0 g/dL   HCT 42.0 36.0 - 46.0 %   MCV 91.7 80.0 - 100.0 fL   MCH 29.5 26.0 - 34.0 pg   MCHC 32.1 30.0 - 36.0 g/dL   RDW 14.0 11.5 - 15.5 %   Platelets 221 150 - 400 K/uL   nRBC 0.0 0.0 - 0.2 %   Neutrophils Relative % 63 %   Neutro Abs 5.1 1.7 - 7.7 K/uL   Lymphocytes Relative 26 %   Lymphs Abs 2.1 0.7 - 4.0 K/uL   Monocytes Relative 8 %   Monocytes Absolute 0.6 0.1 - 1.0 K/uL   Eosinophils Relative 2 %   Eosinophils Absolute 0.1 0.0 - 0.5 K/uL   Basophils Relative 1 %   Basophils Absolute 0.1 0.0 - 0.1 K/uL   Immature Granulocytes 0 %   Abs Immature Granulocytes 0.02 0.00 - 0.07 K/uL    Comment: Performed at [facility], [address], [place], [place] [address]  Comprehensive metabolic panel     Status: Abnormal   Collection Time: 12/26/19 10:54 PM  Result Value Ref Range   Sodium 136 135 - 145 mmol/L   Potassium 3.9 3.5 - 5.1 mmol/L   Chloride 104 98 - 111 mmol/L   CO2 21 (L) 22 - 32 mmol/L   Glucose, Bld 355 (H) 70 - 99 mg/dL    Comment: Glucose reference range applies only to samples taken after fasting for at least 8 hours.   BUN 15 6 - 20 mg/dL   Creatinine, Ser 1.15 (H) 0.44 - 1.00 mg/dL   Calcium 8.7 (L) 8.9 - 10.3 mg/dL   Total Protein 7.0 6.5 - 8.1 g/dL   Albumin 3.7 3.5 - 5.0 g/dL   AST 36 15 - 41 U/L   ALT 26 0 - 44 U/L   Alkaline Phosphatase 87 38 - 126 U/L   Total Bilirubin 0.5 0.3 - 1.2 mg/dL   GFR calc non Af Amer 54 (L) >60 mL/min   GFR calc Af Amer >60 >60 mL/min   Anion gap 11 5 - 15    Comment: Performed at [facility], [address], [place], [place] [address]  Troponin I (High Sensitivity)     Status: None   Collection Time: 12/26/19 10:54 PM  Result Value Ref Range   Troponin I (High Sensitivity) 10 <18 ng/L    Comment: (NOTE) Elevated high sensitivity troponin I (hsTnI) values and  significant  changes across serial measurements may suggest ACS but many other  chronic and acute conditions are known to elevate hsTnI results.  Refer to the "Links" section for chest pain algorithms and additional  guidance. Performed at [facility], [address] [place], [place] [address]   POC urine preg, ED     Status: None   Collection Time: 12/26/19 10:54 PM  Result Value Ref Range   Preg Test, Ur NEGATIVE NEGATIVE    Comment:        THE SENSITIVITY OF THIS METHODOLOGY IS >24 mIU/mL   Brain natriuretic peptide     Status: Abnormal   Collection Time: 12/26/19 10:55 PM  Result Value Ref Range   B Natriuretic Peptide 367.0 (H) 0.0 - 100.0 pg/mL    Comment: Performed at [facility], [address], [place], [place] [address]  Ethanol     Status: None   Collection Time: 12/26/19 10:55 PM  Result Value Ref Range   Alcohol, Ethyl (B) <10 <10 mg/dL    Comment: (NOTE) Lowest detectable limit for serum alcohol is 10 mg/dL. For medical purposes only. Performed at [facility], [address] [place], [place] [address]   Protime-INR     Status: None   Collection Time: 12/26/19 10:55 PM  Result Value Ref Range   Prothrombin Time 14.6 11.4 - 15.2 seconds   INR 1.2 0.8 - 1.2    Comment: (NOTE) INR goal varies based on device and disease states. Performed at [facility], [address], [place], [place] [address]   APTT     Status: Abnormal   Collection Time: 12/26/19 10:55 PM  Result Value Ref Range   aPTT 23 (L) 24 - 36 seconds    Comment: Performed at [facility], [address] [place], [place] [address]  Digoxin level     Status: Abnormal   Collection Time: 12/26/19 10:55 PM  Result Value Ref Range   Digoxin Level <0.1 (L) 0.8 - 2.0 ng/mL    Comment: Performed at [facility]  [address] [place], [place] [address]   DG Chest Port 1 View  Result Date: 12/26/2019 CLINICAL DATA:  Weakness, altered mental status, could stroke EXAM: PORTABLE CHEST 1 VIEW COMPARISON:   CTa 10/31/2019, radiograph 10/31/2019 FINDINGS: Low volumes and central vascular crowding as well as slight prominence of the central pulmonary arteries which may reflect pulmonary artery hypertension. Streaky opacities favor atelectasis in the setting. No focal consolidative process. No pneumothorax or effusion. Cardiac silhouette is enlarged, similar to priors. Remaining cardiomediastinal contours are unremarkable. No acute osseous or soft tissue abnormality. Telemetry leads overlie the chest. IMPRESSION: Low lung volumes with streaky opacities favoring atelectasis. Electronically Signed   By: Lovena Le M.D.   On: 12/26/2019 23:13   CT HEAD CODE STROKE WO CONTRAST  Result Date: 12/26/2019 CLINICAL DATA:  Code stroke. Initial evaluation for acute unresponsiveness. EXAM: CT HEAD WITHOUT CONTRAST TECHNIQUE: Contiguous axial images were obtained from the base of the skull through the vertex without intravenous contrast. COMPARISON:  CT prior head CT from 08/19/2016. FINDINGS: Brain: Examination somewhat technically limited by motion artifact with streak artifact through the skull base. There is question of asymmetric hypodensity involving the left basal ganglia, with involvement of the left caudate and lentiform nuclei. Additionally, the left insula and adjacent left temporal lobe appear hypodense as well. No associated mass effect. No intracranial hemorrhage. No other evidence for acute large vessel territory infarct. No mass lesion or midline shift. No hydrocephalus or extra-axial fluid collection. Vascular: Question asymmetric hyperdensity at the left ICA terminus (series 2, image 11). No other hyperdense vessel. Skull: Scalp soft tissues within normal limits.  Calvarium intact. Sinuses/Orbits: Globes and orbital soft tissues demonstrate no acute finding. Paranasal sinuses are clear. No mastoid effusion. Other: None. ASPECTS [facility] Stroke Program Early CT Score) - Ganglionic level infarction (caudate,  lentiform nuclei, internal capsule, insula, M1-M3 cortex): 3 - Supraganglionic infarction (M4-M6 cortex): 3 Total score (0-10 with 10 being normal): 6 IMPRESSION: 1. Question evolving hypodensity involving the left basal ganglia, left insula, and adjacent left temporal lobe, which could reflect an evolving left MCA territory infarct. Additionally, there is question of asymmetric hyperdensity at the left ICA terminus, which could reflect intraluminal thrombus/LVO. Finding somewhat difficult to be certain given at there is some motion and streak artifact on this exam. Further assessment with dedicated CTA of the head and neck recommended. 2. ASPECTS is 6. Critical Value/emergent results were called by telephone at the time of interpretation on 12/26/2019 at 11:13 pm to provider JOSEPH ZAMMIT , who verbally acknowledged these results. Electronically Signed   By: Jeannine Boga M.D.   On: 12/26/2019 23:18    Assessment: 54 y.o. female with atrial fibrillation, on Eliquis, presenting with acute left MCA ischemic infarction 1. Exam reveals right hemiplegia, right facial droop, leftward gaze deviation, dense receptive/expressive aphasia, decreased level of consciousness and right hemineglect.  2. CT head with findings suggestive of evolving hypodensity involving the left basal ganglia, left insula, and adjacent left temporal lobe, most consistent with an evolving left MCA territory infarct. Additionally, there is question of asymmetric hyperdensity at the left ICA terminus, which could reflect intraluminal thrombus/LVO. ASPECTS is 6. 3. CTA of head: Left ICA terminus occlusion.  4. CTA of neck: No LVO in the arteries of the neck on preliminary review 5. CTP: 24 cc core infarct with 214 cc penumbra involving the left MCA territory.  6. Stroke Risk Factors - DM, HTN, atrial fibrillation and CHF 7. Of note, she was cocaine+  on toxicology screen at [facility]  Plan: 1. The patient is a candidate for endovascular  thrombectomy. Discussed risks/benefits of the procedure extensively over the telephone with the patient's daughter Robin [name] [phone]), including approximately 50% chance of significant clinical improvement and approximately 10% risk of significant worsening due to hemorrhagic complications, including death. The patient's daughter expressed understanding and provided informed consent for the procedure over the telephone. Discussion witnessed by Dr. Tomi Bamberger and Dr. Corrie Mckusick. The patient's obtundation precludes obtaining informed consent from her at this time.  2. Admitting to Neuro ICU under the Neurology service following VIR. Post-VIR order set to include frequent neuro checks and BP management.  3. No antiplatelet medications or anticoagulants for at least 24 hours following VIR.  4. DVT prophylaxis with SCDs.  5. Will need to be started on a statin.  6. Will need to restart anticoagulant therapy if follow up CT at 24 hours is negative for hemorrhagic conversion. May need to switch to an non-Xa inhibitor given treatment failure with Eliquis.  7. MRI brain when stable 8. TTE.  9. Cardiac telemetry  10. PT/OT/Speech.  11. NPO until passes swallow evaluation.  12. Sliding scale insulin.  13. Fasting lipid panel, HgbA1c  50 minutes spent in the emergent neurological evaluation and management of this critically ill patient.  Electronically signed: Dr. Kerney Elbe 12/27/2019, 1:06 AM

## 2019-12-27 NOTE — Progress Notes (Signed)
STROKE TEAM PROGRESS NOTE   INTERVAL HISTORY Her sister is at the bedside. Pt and her husband are separated. She also has 2 kids. Sister is listed as NOK but does not have legal POA. Sister states she and husband/kids agree with pt care.  She presented with terminal left ICA occlusion and urine drug screen was positive for cocaine.  She underwent successful mechanical thrombectomy.  She remains intubated on mechanical ventilation for respiratory failure.Blood pressure adequately controlled.  Follow-up brain imaging is pending.  She remains aphasic and is not following commands with right hemiplegia.  She has spontaneous movements on the left. Vitals:   12/27/19 0815 12/27/19 0830 12/27/19 0845 12/27/19 0900  BP: 137/87 (!) 140/95 (!) 144/102 (!) 137/95  Pulse: 72 87 76 84  Resp: 19 20 18 17   Temp:      TempSrc:      SpO2: 100% 100% 100% 100%  Weight:      Height:        CBC:  Recent Labs  Lab 12/26/19 2254 12/27/19 0305  WBC 8.1 7.9  NEUTROABS 5.1 6.1  HGB 13.5 13.3  HCT 42.0 41.6  MCV 91.7 91.6  PLT 221 [id number]    Basic Metabolic Panel:  Recent Labs  Lab 12/26/19 2254 12/27/19 0305  NA 136 136  K 3.9 4.9  CL 104 106  CO2 21* 20*  GLUCOSE 355* 345*  BUN 15 13  CREATININE 1.15* 0.95  CALCIUM 8.7* 8.3*   Lipid Panel:     Component Value Date/Time   CHOL 121 12/27/2019 0305   TRIG 128 12/27/2019 0305   HDL 27 (L) 12/27/2019 0305   CHOLHDL 4.5 12/27/2019 0305   VLDL 26 12/27/2019 0305   LDLCALC 68 12/27/2019 0305   HgbA1c:  Lab Results  Component Value Date   HGBA1C 11.6 (H) 12/27/2019   Urine Drug Screen:     Component Value Date/Time   LABOPIA NONE DETECTED 12/26/2019 2249   COCAINSCRNUR POSITIVE (A) 12/26/2019 2249   LABBENZ NONE DETECTED 12/26/2019 2249   AMPHETMU NONE DETECTED 12/26/2019 2249   THCU NONE DETECTED 12/26/2019 2249   LABBARB NONE DETECTED 12/26/2019 2249    Alcohol Level     Component Value Date/Time   ETH <10 12/26/2019 2255    IMAGING past 24 hours CT Angio Head W or Wo Contrast  Result Date: 12/27/2019 CLINICAL DATA:  Initial evaluation for acute stroke, unresponsiveness. EXAM: CT ANGIOGRAPHY HEAD AND NECK CT PERFUSION BRAIN TECHNIQUE: Multidetector CT imaging of the head and neck was performed using the standard protocol during bolus administration of intravenous contrast. Multiplanar CT image reconstructions and MIPs were obtained to evaluate the vascular anatomy. Carotid stenosis measurements (when applicable) are obtained utilizing NASCET criteria, using the distal internal carotid diameter as the denominator. Multiphase CT imaging of the brain was performed following IV bolus contrast injection. Subsequent parametric perfusion maps were calculated using RAPID software. CONTRAST:  153mL OMNIPAQUE IOHEXOL 350 MG/ML SOLN COMPARISON:  Prior head CT from earlier the same day. FINDINGS: CTA NECK FINDINGS Aortic arch: Visualized aortic arch of normal caliber with normal branch pattern. Mild atheromatous change about the arch and origin of the great vessels without stenosis. Visualized subclavian arteries widely patent. Right carotid system: Right common and internal carotid arteries widely patent without stenosis, dissection or occlusion. Left carotid system: Left common carotid artery patent from its origin to the bifurcation without stenosis. Mild atheromatous change about the left bifurcation without significant stenosis. Left ICA widely patent distally to  the skull base without stenosis, dissection or occlusion. Vertebral arteries: Both vertebral arteries arise from the subclavian arteries. Right vertebral artery dominant, with a diffusely hypoplastic left vertebral artery. Multifocal moderate to severe segmental stenoses seen involving the hypoplastic left vertebral artery, most pronounced proximally. On the right, there is a single short-segment severe stenosis involving the right V1 segment (series 4, image 272). Otherwise,  right vertebral artery patent within the neck without stenosis or other abnormality. Skeleton: No acute osseous finding. Focal sclerotic lesion within the T2 vertebral body noted, nonspecific, but likely benign. No other worrisome lytic or blastic osseous lesions. Other neck: No other acute soft tissue abnormality within the neck. No mass lesion or adenopathy. 7 mm left thyroid nodule noted, felt to be of doubtful significance given size and patient age. No follow-up imaging recommended regarding this lesion. Upper chest: Visualized upper chest demonstrates no acute finding. Partially visualized lungs are largely clear. Review of the MIP images confirms the above findings CTA HEAD FINDINGS Anterior circulation: On the right, the petrous, cavernous, and supraclinoid segments are widely patent to the terminus. 5 x 6 x 7 mm saccular aneurysm seen arising from the para clinoid right ICA (series 4, image 116). This is directed medially and posteriorly. On the left, patent but somewhat attenuated flow within the petrous and cavernous left ICA, with subsequent occlusion at the supraclinoid segment/left ICA terminus (series 4, image 115). A1 segments are opacified bilaterally, with filling of the left A1 via collateralization across the circle-of-Willis. A2 segments well perfused to their distal aspects. Opacification of the mid and distal left M1 segment is seen, which could be related to subocclusive thrombus and/or collateralization. Normal left MCA bifurcation. Attenuated flow seen within the left MCA branches distally, likely via collateralization. Right M1 widely patent. Normal right MCA bifurcation. Distal right MCA branches well perfused. Small vessel atheromatous irregularity noted. Posterior circulation: Vertebral arteries patent to the vertebrobasilar junction without stenosis. Both picas patent. Basilar diminutive but patent to its distal aspect without stenosis. Superior cerebral arteries patent at their  origins. Both PCAs primarily supplied via the basilar well perfused to their distal aspects. Venous sinuses: Grossly patent allowing for timing of the contrast bolus. Anatomic variants: None significant. Review of the MIP images confirms the above findings CT Brain Perfusion Findings: ASPECTS: 6 CBF (<30%) Volume: 35mL Perfusion (Tmax>6.0s) volume: 259mL Mismatch Volume: 260mL Infarction Location:Patchy core infarct seen involving the deep and subcortical white matter of the anterior left frontal lobe. Large surrounding ischemic penumbra throughout the majority of the left MCA distribution. IMPRESSION: CTA HEAD AND NECK IMPRESSION: 1. Positive CTA with occlusive thrombus at the left ICA terminus. Distal reconstitution at the mid and distal left M1 segment with attenuated flow distally within the left MCA branches, which could be related to subocclusive thrombus and/or collateralization. 2. Mild atheromatous change about the left carotid bifurcation without stenosis. No other hemodynamically significant stenosis involving the left carotid system. 3. Multifocal moderate to severe stenoses involving the hypoplastic left vertebral artery, most notable proximally. Single short-segment severe proximal right V1 stenosis. Dominant right vertebral artery otherwise widely patent. 4. 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 CT PERFUSION IMPRESSION: 1. 24 cc acute core infarct involving the deep and subcortical white matter of the anterior left frontal region. Two hundred fourteen cc surrounding ischemic penumbra involving essentially the entirety of the left MCA distribution. 2. Aspects = 6 on prior head CT. Critical Value/emergent results were called by telephone  at the time of interpretation on 12/26/2019 at 11:48 pm to provider Dr. Tomi Bamberger, Who verbally acknowledged these results. Critical Value/emergent results were called by telephone at the time of interpretation on 12/26/2019 at 11:54 pm to provider Dr.  Cheral Marker, Who verbally acknowledged these results. Electronically Signed   By: Jeannine Boga M.D.   On: 12/27/2019 00:41   CT Angio Neck W and/or Wo Contrast  Result Date: 12/27/2019 CLINICAL DATA:  Initial evaluation for acute stroke, unresponsiveness. EXAM: CT ANGIOGRAPHY HEAD AND NECK CT PERFUSION BRAIN TECHNIQUE: Multidetector CT imaging of the head and neck was performed using the standard protocol during bolus administration of intravenous contrast. Multiplanar CT image reconstructions and MIPs were obtained to evaluate the vascular anatomy. Carotid stenosis measurements (when applicable) are obtained utilizing NASCET criteria, using the distal internal carotid diameter as the denominator. Multiphase CT imaging of the brain was performed following IV bolus contrast injection. Subsequent parametric perfusion maps were calculated using RAPID software. CONTRAST:  14mL OMNIPAQUE IOHEXOL 350 MG/ML SOLN COMPARISON:  Prior head CT from earlier the same day. FINDINGS: CTA NECK FINDINGS Aortic arch: Visualized aortic arch of normal caliber with normal branch pattern. Mild atheromatous change about the arch and origin of the great vessels without stenosis. Visualized subclavian arteries widely patent. Right carotid system: Right common and internal carotid arteries widely patent without stenosis, dissection or occlusion. Left carotid system: Left common carotid artery patent from its origin to the bifurcation without stenosis. Mild atheromatous change about the left bifurcation without significant stenosis. Left ICA widely patent distally to the skull base without stenosis, dissection or occlusion. Vertebral arteries: Both vertebral arteries arise from the subclavian arteries. Right vertebral artery dominant, with a diffusely hypoplastic left vertebral artery. Multifocal moderate to severe segmental stenoses seen involving the hypoplastic left vertebral artery, most pronounced proximally. On the right, there  is a single short-segment severe stenosis involving the right V1 segment (series 4, image 272). Otherwise, right vertebral artery patent within the neck without stenosis or other abnormality. Skeleton: No acute osseous finding. Focal sclerotic lesion within the T2 vertebral body noted, nonspecific, but likely benign. No other worrisome lytic or blastic osseous lesions. Other neck: No other acute soft tissue abnormality within the neck. No mass lesion or adenopathy. 7 mm left thyroid nodule noted, felt to be of doubtful significance given size and patient age. No follow-up imaging recommended regarding this lesion. Upper chest: Visualized upper chest demonstrates no acute finding. Partially visualized lungs are largely clear. Review of the MIP images confirms the above findings CTA HEAD FINDINGS Anterior circulation: On the right, the petrous, cavernous, and supraclinoid segments are widely patent to the terminus. 5 x 6 x 7 mm saccular aneurysm seen arising from the para clinoid right ICA (series 4, image 116). This is directed medially and posteriorly. On the left, patent but somewhat attenuated flow within the petrous and cavernous left ICA, with subsequent occlusion at the supraclinoid segment/left ICA terminus (series 4, image 115). A1 segments are opacified bilaterally, with filling of the left A1 via collateralization across the circle-of-Willis. A2 segments well perfused to their distal aspects. Opacification of the mid and distal left M1 segment is seen, which could be related to subocclusive thrombus and/or collateralization. Normal left MCA bifurcation. Attenuated flow seen within the left MCA branches distally, likely via collateralization. Right M1 widely patent. Normal right MCA bifurcation. Distal right MCA branches well perfused. Small vessel atheromatous irregularity noted. Posterior circulation: Vertebral arteries patent to the vertebrobasilar junction without stenosis. Both  picas patent. Basilar  diminutive but patent to its distal aspect without stenosis. Superior cerebral arteries patent at their origins. Both PCAs primarily supplied via the basilar well perfused to their distal aspects. Venous sinuses: Grossly patent allowing for timing of the contrast bolus. Anatomic variants: None significant. Review of the MIP images confirms the above findings CT Brain Perfusion Findings: ASPECTS: 6 CBF (<30%) Volume: 41mL Perfusion (Tmax>6.0s) volume: 287mL Mismatch Volume: 285mL Infarction Location:Patchy core infarct seen involving the deep and subcortical white matter of the anterior left frontal lobe. Large surrounding ischemic penumbra throughout the majority of the left MCA distribution. IMPRESSION: CTA HEAD AND NECK IMPRESSION: 1. Positive CTA with occlusive thrombus at the left ICA terminus. Distal reconstitution at the mid and distal left M1 segment with attenuated flow distally within the left MCA branches, which could be related to subocclusive thrombus and/or collateralization. 2. Mild atheromatous change about the left carotid bifurcation without stenosis. No other hemodynamically significant stenosis involving the left carotid system. 3. Multifocal moderate to severe stenoses involving the hypoplastic left vertebral artery, most notable proximally. Single short-segment severe proximal right V1 stenosis. Dominant right vertebral artery otherwise widely patent. 4. 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 CT PERFUSION IMPRESSION: 1. 24 cc acute core infarct involving the deep and subcortical white matter of the anterior left frontal region. Two hundred fourteen cc surrounding ischemic penumbra involving essentially the entirety of the left MCA distribution. 2. Aspects = 6 on prior head CT. Critical Value/emergent results were called by telephone at the time of interpretation on 12/26/2019 at 11:48 pm to provider Dr. Tomi Bamberger, Who verbally acknowledged these results. Critical Value/emergent  results were called by telephone at the time of interpretation on 12/26/2019 at 11:54 pm to provider Dr. Cheral Marker, Who verbally acknowledged these results. Electronically Signed   By: Jeannine Boga M.D.   On: 12/27/2019 00:41   IR CT Head Ltd  Result Date: 12/27/2019 INDICATION: 54 year old female with a history of acute left MCA stroke, ELVO, presenting for mechanical thrombectomy EXAM: ULTRASOUND GUIDED ACCESS RIGHT COMMON FEMORAL ARTERY CERVICAL AND CEREBRAL ANGIOGRAM MECHANICAL THROMBECTOMY LEFT ICA ANGIO-SEAL FOR HEMOSTASIS COMPARISON:  CT IMAGING SAME DAY MEDICATIONS: NONE ANESTHESIA/SEDATION: The anesthesia team was present to provide general endotracheal tube anesthesia and for patient monitoring during the procedure. Intubation was performed in Neuro biplane room. Radial arterial line was performed by the anesthesia team. interventional neuro radiology nursing staff was also present. CONTRAST:  50 cc Omnipaque 300 FLUOROSCOPY TIME:  Fluoroscopy Time: 8 minutes 48 seconds (901 mGy). COMPLICATIONS: None TECHNIQUE: Informed written consent was obtained from the patient's family after a thorough discussion of the procedural risks, benefits and alternatives. Specific risks discussed include: Bleeding, infection, contrast reaction, kidney injury/failure, need for further procedure/surgery, arterial injury or dissection, embolization to new territory, intracranial hemorrhage (10-15% risk), neurologic deterioration, cardiopulmonary collapse, death. All questions were addressed. Maximal Sterile Barrier Technique was utilized including during the procedure including caps, mask, sterile gowns, sterile gloves, sterile drape, hand hygiene and skin antiseptic. A timeout was performed prior to the initiation of the procedure. The anesthesia team was present to provide general endotracheal tube anesthesia and for patient monitoring during the procedure. Interventional neuro radiology nursing staff was also  present. FINDINGS: Initial Findings: Left internal carotid artery: Initial angiogram demonstrates ICA terminus occlusion, without significant plaque of the petrous segment, last seroma segment, cavernous segment or the proximal supraclinoid segment. Ophthalmic artery is patent, with choroid blush. Patent posterior communicating  artery with flow into the left PCA. Left MCA: Initial imaging demonstrates no significant primary filling of the MCA. There are leptomeningeal collateral flows partially filling the superior division of the MCA territory. Anterior choroidal artery remains patent with some anterior-posterior flow through the choroidal system into the parietal territory. Left ACA: Initial imaging demonstrates no significant primary filling of the ACA territory. Completion Findings: Left MCA: After 2 passes there is complete restoration of flow through the ICA terminus, incomplete restoration of flow through the right MCA branches. Left ACA: There is restoration of flow into the A1 segment. The right ACA territory is partially served by left-sided ACA branches in the pericallosal distribution. TICI 3 flow restored Flat panel CT demonstrates no subarachnoid hemorrhage or significant contrast staining. PROCEDURE: The anesthesia team was present to provide general endotracheal tube anesthesia and for patient monitoring during the procedure. Intubation was performed in negative pressure [place] in neuro IR holding. Interventional neuro radiology nursing staff was also present. Ultrasound survey of the right inguinal region was performed with images stored and sent to PACs. 11 blade scalpel was used to make a small incision. Blunt dissection was performed with US guidance. A micropuncture needle was used access the right common femoral artery under ultrasound. With excellent arterial blood flow returned, an .018 micro wire was passed through the needle, observed to enter the abdominal aorta under fluoroscopy. The needle  was removed, and a micropuncture sheath was placed over the wire. The inner dilator and wire were removed, and an 035 wire was advanced under fluoroscopy into the abdominal aorta. The sheath was removed and a 25cm 50F straight vascular sheath was placed. The dilator was removed and the sheath was flushed. Sheath was attached to pressurized and heparinized saline bag for constant forward flow. A coaxial system was then advanced over the 035 wire. This included a 95cm 087 "Walrus" balloon guide with coaxial 125cm Berenstein diagnostic catheter. This was advanced to the proximal descending thoracic aorta. Wire was then removed. Double flush of the catheter was performed. Catheter was then used to select the left common carotid artery. Angiogram was performed. The catheter and balloon guide combination was advanced over a standard glide wire into left cervical ICA, with distal position achieved of the balloon guide. The diagnostic catheter and the wire were removed. Formal angiogram was performed. Road map function was used once the occluded vessel was identified. Copious back flush was performed and the balloon catheter was attached to heparinized and pressurized saline bag for forward flow. A second coaxial system was then advanced through the balloon catheter, which included the selected intermediate catheter, microcatheter, and microwire. In this scenario, the set up included a 71 zoom catheter, a Trevo Provue18 microcatheter, and 014 synchro soft wire. This system was advanced through the balloon guide catheter under the road-map function, with adequate back-flush at the rotating hemostatic valve at that back end of the balloon guide. Microcatheter and the intermediate catheter system were advanced through the siphon to the level of the occlusion. The microwire was passed through the occlusion into the proximal MCA. Aspiration catheter was then advanced through the ophthalmic segment. Microcatheter and the microwire  were removed. The aspiration catheter was attached to the proprietary engine for aspiration confirming adequate backflow. Catheter was then advanced to the site of the occlusion, confirming stasis of backflow. 1 minutes time frame was observed. The catheter was then withdrawn into the ophthalmic segment under fluoroscopy. No significant flow was observed. The balloon on the balloon guide  was gently inflated under fluoroscopy. Aspiration catheter was removed from the system while aspiration was performed at the balloon guide hub. The aspiration catheter was completely removed, adequate blood return was confirmed through the balloon guide, and then the balloon on the balloon guide was deflated. Repeat control angiogram was performed confirming continued occlusion at the ICA terminus. We elected to perform a 2nd pass. Coaxial system was again advanced through the balloon catheter, which included the selected intermediate catheter, microcatheter, and microwire. In this scenario, the set up included a CAT 6, a Trevo Provue18 microcatheter, and 014 synchro soft wire. This system was advanced through the balloon guide catheter under the road-map function, with adequate back-flush at the rotating hemostatic valve at that back end of the balloon guide. Blood was then aspirated through the hub of the microcatheter, and a gentle contrast injection was performed confirming intraluminal position. A rotating hemostatic valve was then attached to the back end of the microcatheter, and a pressurized and heparinized saline bag was attached to the catheter. 4 x 40 solitaire device was then selected. Back flush was achieved at the rotating hemostatic valve, and then the device was gently advanced through the microcatheter to the distal end. The retriever was then unsheathed by withdrawing the microcatheter under fluoroscopy. Once the retriever was completely unsheathed, the microcatheter was carefully stripped from the delivery  device. Control angiogram was performed from the intermediate catheter. A 3 minute time interval was observed. The balloon at the balloon guide catheter was then inflated under fluoroscopy for proximal flow arrest. Constant aspiration using the proprietary engine was then performed at the intermediate catheter, as the retriever was gently and slowly withdrawn with fluoroscopic observation. Once the retriever was "corked" within the tip of the intermediate catheter, both were removed from the system. Free aspiration was confirmed at the hub of the balloon guide catheter, with free blood return confirmed. The balloon was then deflated, and a control angiogram was performed. Restoration of flow was confirmed. Angiogram of the cervical ICA was performed. Balloon guide was withdrawn into the carotid artery and repeat angiogram was performed. Balloon guide was then removed. The skin at the puncture site was then cleaned with Chlorhexidine. The 8 French sheath was removed and an 63F angioseal was deployed. Flat panel CT was performed. Patient was extubated once the CT was reviewed. Patient tolerated the procedure well and remained hemodynamically stable throughout. No complications were encountered and no significant blood loss encountered. IMPRESSION: Status post ultrasound guided access right common femoral artery for left-sided cervical/cerebral angiogram and mechanical thrombectomy of ICA terminus occlusion restoring complete flow (TICI 3) into the ACA territory and MCA territory of the left hemisphere. Angio-Seal deployed for hemostasis. Signed, Dulcy Fanny. Dellia Nims, RPVI [facility] Radiology PLAN: The patient will remain intubated ICU status Target systolic blood pressure of 120-140 Right hip straight time 6 hours Frequent neurovascular checks Repeat neurologic imaging with CT and/MRI at the discretion of neurology team Electronically Signed   By: Corrie Mckusick D.O.    On: 12/27/2019 02:46   IR US Guide Vasc Access Right  Result Date: 12/27/2019 INDICATION: 54 year old female with a history of acute left MCA stroke, ELVO, presenting for mechanical thrombectomy EXAM: ULTRASOUND GUIDED ACCESS RIGHT COMMON FEMORAL ARTERY CERVICAL AND CEREBRAL ANGIOGRAM MECHANICAL THROMBECTOMY LEFT ICA ANGIO-SEAL FOR HEMOSTASIS COMPARISON:  CT IMAGING SAME DAY MEDICATIONS: NONE ANESTHESIA/SEDATION: The anesthesia team was present to provide general endotracheal tube anesthesia and for patient monitoring during the procedure.  Intubation was performed in Neuro biplane room. Radial arterial line was performed by the anesthesia team. interventional neuro radiology nursing staff was also present. CONTRAST:  50 cc Omnipaque 300 FLUOROSCOPY TIME:  Fluoroscopy Time: 8 minutes 48 seconds (901 mGy). COMPLICATIONS: None TECHNIQUE: Informed written consent was obtained from the patient's family after a thorough discussion of the procedural risks, benefits and alternatives. Specific risks discussed include: Bleeding, infection, contrast reaction, kidney injury/failure, need for further procedure/surgery, arterial injury or dissection, embolization to new territory, intracranial hemorrhage (10-15% risk), neurologic deterioration, cardiopulmonary collapse, death. All questions were addressed. Maximal Sterile Barrier Technique was utilized including during the procedure including caps, mask, sterile gowns, sterile gloves, sterile drape, hand hygiene and skin antiseptic. A timeout was performed prior to the initiation of the procedure. The anesthesia team was present to provide general endotracheal tube anesthesia and for patient monitoring during the procedure. Interventional neuro radiology nursing staff was also present. FINDINGS: Initial Findings: Left internal carotid artery: Initial angiogram demonstrates ICA terminus occlusion, without significant plaque of the petrous segment, last seroma segment,  cavernous segment or the proximal supraclinoid segment. Ophthalmic artery is patent, with choroid blush. Patent posterior communicating artery with flow into the left PCA. Left MCA: Initial imaging demonstrates no significant primary filling of the MCA. There are leptomeningeal collateral flows partially filling the superior division of the MCA territory. Anterior choroidal artery remains patent with some anterior-posterior flow through the choroidal system into the parietal territory. Left ACA: Initial imaging demonstrates no significant primary filling of the ACA territory. Completion Findings: Left MCA: After 2 passes there is complete restoration of flow through the ICA terminus, incomplete restoration of flow through the right MCA branches. Left ACA: There is restoration of flow into the A1 segment. The right ACA territory is partially served by left-sided ACA branches in the pericallosal distribution. TICI 3 flow restored Flat panel CT demonstrates no subarachnoid hemorrhage or significant contrast staining. PROCEDURE: The anesthesia team was present to provide general endotracheal tube anesthesia and for patient monitoring during the procedure. Intubation was performed in negative pressure [place] in neuro IR holding. Interventional neuro radiology nursing staff was also present. Ultrasound survey of the right inguinal region was performed with images stored and sent to PACs. 11 blade scalpel was used to make a small incision. Blunt dissection was performed with US guidance. A micropuncture needle was used access the right common femoral artery under ultrasound. With excellent arterial blood flow returned, an .018 micro wire was passed through the needle, observed to enter the abdominal aorta under fluoroscopy. The needle was removed, and a micropuncture sheath was placed over the wire. The inner dilator and wire were removed, and an 035 wire was advanced under fluoroscopy into the abdominal aorta. The sheath was  removed and a 25cm 2F straight vascular sheath was placed. The dilator was removed and the sheath was flushed. Sheath was attached to pressurized and heparinized saline bag for constant forward flow. A coaxial system was then advanced over the 035 wire. This included a 95cm 087 "Walrus" balloon guide with coaxial 125cm Berenstein diagnostic catheter. This was advanced to the proximal descending thoracic aorta. Wire was then removed. Double flush of the catheter was performed. Catheter was then used to select the left common carotid artery. Angiogram was performed. The catheter and balloon guide combination was advanced over a standard glide wire into left cervical ICA, with distal position achieved of the balloon guide. The diagnostic catheter and the wire were removed. Formal angiogram was  performed. Road map function was used once the occluded vessel was identified. Copious back flush was performed and the balloon catheter was attached to heparinized and pressurized saline bag for forward flow. A second coaxial system was then advanced through the balloon catheter, which included the selected intermediate catheter, microcatheter, and microwire. In this scenario, the set up included a 71 zoom catheter, a Trevo Provue18 microcatheter, and 014 synchro soft wire. This system was advanced through the balloon guide catheter under the road-map function, with adequate back-flush at the rotating hemostatic valve at that back end of the balloon guide. Microcatheter and the intermediate catheter system were advanced through the siphon to the level of the occlusion. The microwire was passed through the occlusion into the proximal MCA. Aspiration catheter was then advanced through the ophthalmic segment. Microcatheter and the microwire were removed. The aspiration catheter was attached to the proprietary engine for aspiration confirming adequate backflow. Catheter was then advanced to the site of the occlusion, confirming  stasis of backflow. 1 minutes time frame was observed. The catheter was then withdrawn into the ophthalmic segment under fluoroscopy. No significant flow was observed. The balloon on the balloon guide was gently inflated under fluoroscopy. Aspiration catheter was removed from the system while aspiration was performed at the balloon guide hub. The aspiration catheter was completely removed, adequate blood return was confirmed through the balloon guide, and then the balloon on the balloon guide was deflated. Repeat control angiogram was performed confirming continued occlusion at the ICA terminus. We elected to perform a 2nd pass. Coaxial system was again advanced through the balloon catheter, which included the selected intermediate catheter, microcatheter, and microwire. In this scenario, the set up included a CAT 6, a Trevo Provue18 microcatheter, and 014 synchro soft wire. This system was advanced through the balloon guide catheter under the road-map function, with adequate back-flush at the rotating hemostatic valve at that back end of the balloon guide. Blood was then aspirated through the hub of the microcatheter, and a gentle contrast injection was performed confirming intraluminal position. A rotating hemostatic valve was then attached to the back end of the microcatheter, and a pressurized and heparinized saline bag was attached to the catheter. 4 x 40 solitaire device was then selected. Back flush was achieved at the rotating hemostatic valve, and then the device was gently advanced through the microcatheter to the distal end. The retriever was then unsheathed by withdrawing the microcatheter under fluoroscopy. Once the retriever was completely unsheathed, the microcatheter was carefully stripped from the delivery device. Control angiogram was performed from the intermediate catheter. A 3 minute time interval was observed. The balloon at the balloon guide catheter was then inflated under fluoroscopy for  proximal flow arrest. Constant aspiration using the proprietary engine was then performed at the intermediate catheter, as the retriever was gently and slowly withdrawn with fluoroscopic observation. Once the retriever was "corked" within the tip of the intermediate catheter, both were removed from the system. Free aspiration was confirmed at the hub of the balloon guide catheter, with free blood return confirmed. The balloon was then deflated, and a control angiogram was performed. Restoration of flow was confirmed. Angiogram of the cervical ICA was performed. Balloon guide was withdrawn into the carotid artery and repeat angiogram was performed. Balloon guide was then removed. The skin at the puncture site was then cleaned with Chlorhexidine. The 8 French sheath was removed and an 49F angioseal was deployed. Flat panel CT was performed. Patient was extubated once the  CT was reviewed. Patient tolerated the procedure well and remained hemodynamically stable throughout. No complications were encountered and no significant blood loss encountered. IMPRESSION: Status post ultrasound guided access right common femoral artery for left-sided cervical/cerebral angiogram and mechanical thrombectomy of ICA terminus occlusion restoring complete flow (TICI 3) into the ACA territory and MCA territory of the left hemisphere. Angio-Seal deployed for hemostasis. Signed, Dulcy Fanny. Dellia Nims, RPVI [facility] Radiology PLAN: The patient will remain intubated ICU status Target systolic blood pressure of 120-140 Right hip straight time 6 hours Frequent neurovascular checks Repeat neurologic imaging with CT and/MRI at the discretion of neurology team Electronically Signed   By: Corrie Mckusick D.O.   On: 12/27/2019 02:46   CT CEREBRAL PERFUSION W CONTRAST  Result Date: 12/27/2019 CLINICAL DATA:  Initial evaluation for acute stroke, unresponsiveness. EXAM: CT ANGIOGRAPHY HEAD AND NECK CT  PERFUSION BRAIN TECHNIQUE: Multidetector CT imaging of the head and neck was performed using the standard protocol during bolus administration of intravenous contrast. Multiplanar CT image reconstructions and MIPs were obtained to evaluate the vascular anatomy. Carotid stenosis measurements (when applicable) are obtained utilizing NASCET criteria, using the distal internal carotid diameter as the denominator. Multiphase CT imaging of the brain was performed following IV bolus contrast injection. Subsequent parametric perfusion maps were calculated using RAPID software. CONTRAST:  136mL OMNIPAQUE IOHEXOL 350 MG/ML SOLN COMPARISON:  Prior head CT from earlier the same day. FINDINGS: CTA NECK FINDINGS Aortic arch: Visualized aortic arch of normal caliber with normal branch pattern. Mild atheromatous change about the arch and origin of the great vessels without stenosis. Visualized subclavian arteries widely patent. Right carotid system: Right common and internal carotid arteries widely patent without stenosis, dissection or occlusion. Left carotid system: Left common carotid artery patent from its origin to the bifurcation without stenosis. Mild atheromatous change about the left bifurcation without significant stenosis. Left ICA widely patent distally to the skull base without stenosis, dissection or occlusion. Vertebral arteries: Both vertebral arteries arise from the subclavian arteries. Right vertebral artery dominant, with a diffusely hypoplastic left vertebral artery. Multifocal moderate to severe segmental stenoses seen involving the hypoplastic left vertebral artery, most pronounced proximally. On the right, there is a single short-segment severe stenosis involving the right V1 segment (series 4, image 272). Otherwise, right vertebral artery patent within the neck without stenosis or other abnormality. Skeleton: No acute osseous finding. Focal sclerotic lesion within the T2 vertebral body noted, nonspecific,  but likely benign. No other worrisome lytic or blastic osseous lesions. Other neck: No other acute soft tissue abnormality within the neck. No mass lesion or adenopathy. 7 mm left thyroid nodule noted, felt to be of doubtful significance given size and patient age. No follow-up imaging recommended regarding this lesion. Upper chest: Visualized upper chest demonstrates no acute finding. Partially visualized lungs are largely clear. Review of the MIP images confirms the above findings CTA HEAD FINDINGS Anterior circulation: On the right, the petrous, cavernous, and supraclinoid segments are widely patent to the terminus. 5 x 6 x 7 mm saccular aneurysm seen arising from the para clinoid right ICA (series 4, image 116). This is directed medially and posteriorly. On the left, patent but somewhat attenuated flow within the petrous and cavernous left ICA, with subsequent occlusion at the supraclinoid segment/left ICA terminus (series 4, image 115). A1 segments are opacified bilaterally, with filling of the left A1 via collateralization across the circle-of-Willis. A2 segments well perfused to their distal aspects. Opacification of  the mid and distal left M1 segment is seen, which could be related to subocclusive thrombus and/or collateralization. Normal left MCA bifurcation. Attenuated flow seen within the left MCA branches distally, likely via collateralization. Right M1 widely patent. Normal right MCA bifurcation. Distal right MCA branches well perfused. Small vessel atheromatous irregularity noted. Posterior circulation: Vertebral arteries patent to the vertebrobasilar junction without stenosis. Both picas patent. Basilar diminutive but patent to its distal aspect without stenosis. Superior cerebral arteries patent at their origins. Both PCAs primarily supplied via the basilar well perfused to their distal aspects. Venous sinuses: Grossly patent allowing for timing of the contrast bolus. Anatomic variants: None  significant. Review of the MIP images confirms the above findings CT Brain Perfusion Findings: ASPECTS: 6 CBF (<30%) Volume: 13mL Perfusion (Tmax>6.0s) volume: 269mL Mismatch Volume: 265mL Infarction Location:Patchy core infarct seen involving the deep and subcortical white matter of the anterior left frontal lobe. Large surrounding ischemic penumbra throughout the majority of the left MCA distribution. IMPRESSION: CTA HEAD AND NECK IMPRESSION: 1. Positive CTA with occlusive thrombus at the left ICA terminus. Distal reconstitution at the mid and distal left M1 segment with attenuated flow distally within the left MCA branches, which could be related to subocclusive thrombus and/or collateralization. 2. Mild atheromatous change about the left carotid bifurcation without stenosis. No other hemodynamically significant stenosis involving the left carotid system. 3. Multifocal moderate to severe stenoses involving the hypoplastic left vertebral artery, most notable proximally. Single short-segment severe proximal right V1 stenosis. Dominant right vertebral artery otherwise widely patent. 4. 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 CT PERFUSION IMPRESSION: 1. 24 cc acute core infarct involving the deep and subcortical white matter of the anterior left frontal region. Two hundred fourteen cc surrounding ischemic penumbra involving essentially the entirety of the left MCA distribution. 2. Aspects = 6 on prior head CT. Critical Value/emergent results were called by telephone at the time of interpretation on 12/26/2019 at 11:48 pm to provider Dr. Tomi Bamberger, Who verbally acknowledged these results. Critical Value/emergent results were called by telephone at the time of interpretation on 12/26/2019 at 11:54 pm to provider Dr. Cheral Marker, Who verbally acknowledged these results. Electronically Signed   By: Jeannine Boga M.D.   On: 12/27/2019 00:41   DG Chest Port 1 View  Result Date: 12/27/2019 CLINICAL  DATA:  Endotracheal tube placement EXAM: PORTABLE CHEST 1 VIEW COMPARISON:  Dec 26, 2019 FINDINGS: The endotracheal tube terminates above the carina by approximately 2.9 cm. The enteric tube extends below the left hemidiaphragm. There is postoperative atelectasis at the lung bases. There is no pneumothorax. The heart size is stable but enlarged. There is no acute osseous abnormality. IMPRESSION: 1. Lines and tubes as above. 2. Postoperative atelectasis at the lung bases. 3. Cardiomegaly. Electronically Signed   By: Constance Holster M.D.   On: 12/27/2019 03:44   DG Chest Port 1 View  Result Date: 12/26/2019 CLINICAL DATA:  Weakness, altered mental status, could stroke EXAM: PORTABLE CHEST 1 VIEW COMPARISON:  CTa 10/31/2019, radiograph 10/31/2019 FINDINGS: Low volumes and central vascular crowding as well as slight prominence of the central pulmonary arteries which may reflect pulmonary artery hypertension. Streaky opacities favor atelectasis in the setting. No focal consolidative process. No pneumothorax or effusion. Cardiac silhouette is enlarged, similar to priors. Remaining cardiomediastinal contours are unremarkable. No acute osseous or soft tissue abnormality. Telemetry leads overlie the chest. IMPRESSION: Low lung volumes with streaky opacities favoring atelectasis. Electronically Signed  By: Lovena Le M.D.   On: 12/26/2019 23:13   IR PERCUTANEOUS ART THROMBECTOMY/INFUSION INTRACRANIAL INC DIAG ANGIO  Result Date: 12/27/2019 INDICATION: 54 year old female with a history of acute left MCA stroke, ELVO, presenting for mechanical thrombectomy EXAM: ULTRASOUND GUIDED ACCESS RIGHT COMMON FEMORAL ARTERY CERVICAL AND CEREBRAL ANGIOGRAM MECHANICAL THROMBECTOMY LEFT ICA ANGIO-SEAL FOR HEMOSTASIS COMPARISON:  CT IMAGING SAME DAY MEDICATIONS: NONE ANESTHESIA/SEDATION: The anesthesia team was present to provide general endotracheal tube anesthesia and for patient monitoring during the procedure. Intubation  was performed in Neuro biplane room. Radial arterial line was performed by the anesthesia team. interventional neuro radiology nursing staff was also present. CONTRAST:  50 cc Omnipaque 300 FLUOROSCOPY TIME:  Fluoroscopy Time: 8 minutes 48 seconds (901 mGy). COMPLICATIONS: None TECHNIQUE: Informed written consent was obtained from the patient's family after a thorough discussion of the procedural risks, benefits and alternatives. Specific risks discussed include: Bleeding, infection, contrast reaction, kidney injury/failure, need for further procedure/surgery, arterial injury or dissection, embolization to new territory, intracranial hemorrhage (10-15% risk), neurologic deterioration, cardiopulmonary collapse, death. All questions were addressed. Maximal Sterile Barrier Technique was utilized including during the procedure including caps, mask, sterile gowns, sterile gloves, sterile drape, hand hygiene and skin antiseptic. A timeout was performed prior to the initiation of the procedure. The anesthesia team was present to provide general endotracheal tube anesthesia and for patient monitoring during the procedure. Interventional neuro radiology nursing staff was also present. FINDINGS: Initial Findings: Left internal carotid artery: Initial angiogram demonstrates ICA terminus occlusion, without significant plaque of the petrous segment, last seroma segment, cavernous segment or the proximal supraclinoid segment. Ophthalmic artery is patent, with choroid blush. Patent posterior communicating artery with flow into the left PCA. Left MCA: Initial imaging demonstrates no significant primary filling of the MCA. There are leptomeningeal collateral flows partially filling the superior division of the MCA territory. Anterior choroidal artery remains patent with some anterior-posterior flow through the choroidal system into the parietal territory. Left ACA: Initial imaging demonstrates no significant primary filling of the  ACA territory. Completion Findings: Left MCA: After 2 passes there is complete restoration of flow through the ICA terminus, incomplete restoration of flow through the right MCA branches. Left ACA: There is restoration of flow into the A1 segment. The right ACA territory is partially served by left-sided ACA branches in the pericallosal distribution. TICI 3 flow restored Flat panel CT demonstrates no subarachnoid hemorrhage or significant contrast staining. PROCEDURE: The anesthesia team was present to provide general endotracheal tube anesthesia and for patient monitoring during the procedure. Intubation was performed in negative pressure [place] in neuro IR holding. Interventional neuro radiology nursing staff was also present. Ultrasound survey of the right inguinal region was performed with images stored and sent to PACs. 11 blade scalpel was used to make a small incision. Blunt dissection was performed with US guidance. A micropuncture needle was used access the right common femoral artery under ultrasound. With excellent arterial blood flow returned, an .018 micro wire was passed through the needle, observed to enter the abdominal aorta under fluoroscopy. The needle was removed, and a micropuncture sheath was placed over the wire. The inner dilator and wire were removed, and an 035 wire was advanced under fluoroscopy into the abdominal aorta. The sheath was removed and a 25cm 18F straight vascular sheath was placed. The dilator was removed and the sheath was flushed. Sheath was attached to pressurized and heparinized saline bag for constant forward flow. A coaxial system was then advanced over the  035 wire. This included a 95cm 087 "Walrus" balloon guide with coaxial 125cm Berenstein diagnostic catheter. This was advanced to the proximal descending thoracic aorta. Wire was then removed. Double flush of the catheter was performed. Catheter was then used to select the left common carotid artery. Angiogram was  performed. The catheter and balloon guide combination was advanced over a standard glide wire into left cervical ICA, with distal position achieved of the balloon guide. The diagnostic catheter and the wire were removed. Formal angiogram was performed. Road map function was used once the occluded vessel was identified. Copious back flush was performed and the balloon catheter was attached to heparinized and pressurized saline bag for forward flow. A second coaxial system was then advanced through the balloon catheter, which included the selected intermediate catheter, microcatheter, and microwire. In this scenario, the set up included a 71 zoom catheter, a Trevo Provue18 microcatheter, and 014 synchro soft wire. This system was advanced through the balloon guide catheter under the road-map function, with adequate back-flush at the rotating hemostatic valve at that back end of the balloon guide. Microcatheter and the intermediate catheter system were advanced through the siphon to the level of the occlusion. The microwire was passed through the occlusion into the proximal MCA. Aspiration catheter was then advanced through the ophthalmic segment. Microcatheter and the microwire were removed. The aspiration catheter was attached to the proprietary engine for aspiration confirming adequate backflow. Catheter was then advanced to the site of the occlusion, confirming stasis of backflow. 1 minutes time frame was observed. The catheter was then withdrawn into the ophthalmic segment under fluoroscopy. No significant flow was observed. The balloon on the balloon guide was gently inflated under fluoroscopy. Aspiration catheter was removed from the system while aspiration was performed at the balloon guide hub. The aspiration catheter was completely removed, adequate blood return was confirmed through the balloon guide, and then the balloon on the balloon guide was deflated. Repeat control angiogram was performed confirming  continued occlusion at the ICA terminus. We elected to perform a 2nd pass. Coaxial system was again advanced through the balloon catheter, which included the selected intermediate catheter, microcatheter, and microwire. In this scenario, the set up included a CAT 6, a Trevo Provue18 microcatheter, and 014 synchro soft wire. This system was advanced through the balloon guide catheter under the road-map function, with adequate back-flush at the rotating hemostatic valve at that back end of the balloon guide. Blood was then aspirated through the hub of the microcatheter, and a gentle contrast injection was performed confirming intraluminal position. A rotating hemostatic valve was then attached to the back end of the microcatheter, and a pressurized and heparinized saline bag was attached to the catheter. 4 x 40 solitaire device was then selected. Back flush was achieved at the rotating hemostatic valve, and then the device was gently advanced through the microcatheter to the distal end. The retriever was then unsheathed by withdrawing the microcatheter under fluoroscopy. Once the retriever was completely unsheathed, the microcatheter was carefully stripped from the delivery device. Control angiogram was performed from the intermediate catheter. A 3 minute time interval was observed. The balloon at the balloon guide catheter was then inflated under fluoroscopy for proximal flow arrest. Constant aspiration using the proprietary engine was then performed at the intermediate catheter, as the retriever was gently and slowly withdrawn with fluoroscopic observation. Once the retriever was "corked" within the tip of the intermediate catheter, both were removed from the system. Free aspiration was confirmed at  the hub of the balloon guide catheter, with free blood return confirmed. The balloon was then deflated, and a control angiogram was performed. Restoration of flow was confirmed. Angiogram of the cervical ICA was  performed. Balloon guide was withdrawn into the carotid artery and repeat angiogram was performed. Balloon guide was then removed. The skin at the puncture site was then cleaned with Chlorhexidine. The 8 French sheath was removed and an 28F angioseal was deployed. Flat panel CT was performed. Patient was extubated once the CT was reviewed. Patient tolerated the procedure well and remained hemodynamically stable throughout. No complications were encountered and no significant blood loss encountered. IMPRESSION: Status post ultrasound guided access right common femoral artery for left-sided cervical/cerebral angiogram and mechanical thrombectomy of ICA terminus occlusion restoring complete flow (TICI 3) into the ACA territory and MCA territory of the left hemisphere. Angio-Seal deployed for hemostasis. Signed, Dulcy Fanny. Dellia Nims, RPVI [facility] Radiology PLAN: The patient will remain intubated ICU status Target systolic blood pressure of 120-140 Right hip straight time 6 hours Frequent neurovascular checks Repeat neurologic imaging with CT and/MRI at the discretion of neurology team Electronically Signed   By: Corrie Mckusick D.O.   On: 12/27/2019 02:46   CT HEAD CODE STROKE WO CONTRAST  Result Date: 12/26/2019 CLINICAL DATA:  Code stroke. Initial evaluation for acute unresponsiveness. EXAM: CT HEAD WITHOUT CONTRAST TECHNIQUE: Contiguous axial images were obtained from the base of the skull through the vertex without intravenous contrast. COMPARISON:  CT prior head CT from 08/19/2016. FINDINGS: Brain: Examination somewhat technically limited by motion artifact with streak artifact through the skull base. There is question of asymmetric hypodensity involving the left basal ganglia, with involvement of the left caudate and lentiform nuclei. Additionally, the left insula and adjacent left temporal lobe appear hypodense as well. No associated mass effect. No  intracranial hemorrhage. No other evidence for acute large vessel territory infarct. No mass lesion or midline shift. No hydrocephalus or extra-axial fluid collection. Vascular: Question asymmetric hyperdensity at the left ICA terminus (series 2, image 11). No other hyperdense vessel. Skull: Scalp soft tissues within normal limits.  Calvarium intact. Sinuses/Orbits: Globes and orbital soft tissues demonstrate no acute finding. Paranasal sinuses are clear. No mastoid effusion. Other: None. ASPECTS [facility] Stroke Program Early CT Score) - Ganglionic level infarction (caudate, lentiform nuclei, internal capsule, insula, M1-M3 cortex): 3 - Supraganglionic infarction (M4-M6 cortex): 3 Total score (0-10 with 10 being normal): 6 IMPRESSION: 1. Question evolving hypodensity involving the left basal ganglia, left insula, and adjacent left temporal lobe, which could reflect an evolving left MCA territory infarct. Additionally, there is question of asymmetric hyperdensity at the left ICA terminus, which could reflect intraluminal thrombus/LVO. Finding somewhat difficult to be certain given at there is some motion and streak artifact on this exam. Further assessment with dedicated CTA of the head and neck recommended. 2. ASPECTS is 6. Critical Value/emergent results were called by telephone at the time of interpretation on 12/26/2019 at 11:13 pm to provider JOSEPH ZAMMIT , who verbally acknowledged these results. Electronically Signed   By: Jeannine Boga M.D.   On: 12/26/2019 23:18    PHYSICAL EXAM Obese middle-aged African-American lady who is intubated and not sedated. . Afebrile. Head is nontraumatic. Neck is supple without bruit.    Cardiac exam no murmur or gallop. Lungs are clear to auscultation. Distal pulses are well felt. Neurological Exam :  Patient is drowsy with eyes closed.  She opens eyes minimally  on auditory and tactile stimulation but is globally aphasic and does not follow any commands.  Slight  left gaze preference.  Doll's eye movements are sluggish.  Pupils equal reactive.  Fundi could not be visualized.  Spontaneous left upper and lower extremity movements.  Right hemiplegia with right upper extremity flaccidity and hypotonia with no withdrawal even to painful stimuli.  Trace withdrawal in the right lower extremity to painful stimuli.  Deep tendon flexes are depressed on the right and present on the left.  Right plantar equivocal left downgoing.  Gait not tested. ASSESSMENT/PLAN Robin [name] is a 54 y.o. female with history of DM, HTN, atrial fibrillation on Eliquis, anemia, CHF, COPD and polysubstance abuse presenting to [facility] with R hemiparesis, unresponsive. Found to have L MCA infarct transferred to [facility] for thrombectomy .   Stroke:   R brain infarct embolic in setting of cocaine use  and cardiomyopathy  Code Stroke C T head evolving hypodensity L basal ganglia, L insula, L temporal lobe. ? Hyperdensity L ICA terminus. ASPECTS 6    CTA head & neck L ICA terminus occlusion w/ distal reconstitution mid and idstal L M1 w/ attenuated flow distally. Mild L ICA bifurcation atherosclerosis. Short severe proximal R V1 stenosis. 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   CT perfusion 24cc core infarct deep and subcortical white matter anterior L frontal lobe. 214cc penumbra.  Cerebral angio L ICA occlusion w/ no flow in ACA or MCA. TICI3 revascularization ACA and MCA. Earleen Newport)  Post IR CT no hemorrhage, no stain   MRI large patchy left MCA infarct with hemorrhagic petechial transformation.  2D Echo decreased ejection fraction 30 to 35%.  No definite clot.  LDL 68  HgbA1c 11.6  SCDs for VTE prophylaxis  Eliquis (apixaban) daily prior to admission, now on No antithrombotic. Add aspirin 81 for now. Based on MRI results, may consider IV heparin as transition to oral anticoagulants.  Therapy recommendations:  pending   Disposition:   pending   Acute Respiratory Failure  Intubated for IR, left intubated for airway protection   CXR streaky opacities favoring atx yest, today w/ post op atx  Sedated   Hold extubation until MRI reviewed. May be able to extubate later today.  CCM on board  Atrial Fibrillation  Home anticoagulation:  Eliquis (apixaban) daily   On Coreg 50 bid, digoxin 0.125 daily PTA  On diltiazem gtt - wean as able  Hypertension  Home meds:  Hydralazine 50 tid . BP goal per IR x 24h following IR procedure  . Current BP 140s . Added prn labetalol . Long-term BP goal normotensive  Diabetes type II Uncontrolled  Home meds:  trulicity [id number] weekly, 0000000 30u bid, metformin 1000  HgbA1c 11.6, goal < 7.0  CBGs Recent Labs    12/26/19 2247 12/27/19 0302 12/27/19 0749  GLUCAP 319* 299* 208*      SSI  Dysphagia . Secondary to stroke . NPO . Hold tube placement, TF until extubation plan determined . Speech on board   Other Stroke Risk Factors  Former Cigarette smoker  ETOH use, alcohol level <10, will advise to drink no more than 1 drink(s) a day. Family aware  Polysubstance abuse, hx cocaine and marijuana- UDS:  THC NEGATIVE, Cocaine POSITIVE. Will advised to stop using due to stroke risk. Family aware.  Obesity, Body mass index is 35.19 kg/m., recommend weight loss, diet and exercise as appropriate   Congestive heart failure, on torsemide  60 bid  Other Active Problems  Fatty liver  COPD  GERD  Hospital day # 0  Patient presented with large left MCA infarct due to left carotid occlusion and underwent successful mechanical thrombectomy but remains aphasic with right hemiplegia on exam.  Continue close neurological monitoring and strict blood pressure control as per post intervention protocol.  Wean off sedation and ventilatory support over the next 24 hours and consider extubation if tolerated.  Discussed with sister and ex-husband at the bedside.  Family will need to  make decision about one-way extubation versus reintubation in case she gets worse and tracheostomy/PEG tube versus DNR and guide further therapy.  Hold anticoagulation due to hemorrhagic transformation but start aspirin.  Keep systolic blood pressure goal below 140 for 24 hours and then below 160 thereafter.  Discussed with Dr. Kipp Brood critical care medicine.  Keep euvolemic, normothermic and normoglycemic. This patient is critically ill and at significant risk of neurological worsening, death and care requires constant monitoring of vital signs, hemodynamics,respiratory and cardiac monitoring, extensive review of multiple databases, frequent neurological assessment, discussion with family, other specialists and medical decision making of high complexity.I have made any additions or clarifications directly to the above note.This critical care time does not reflect procedure time, or teaching time or supervisory time of PA/NP/Med Resident etc but could involve care discussion time.  I spent 50 minutes of neurocritical care time  in the care of  this patient. Antony Contras, MD    To contact Stroke Continuity provider, please refer to [email or web address] After hours, contact General Neurology

## 2019-12-27 NOTE — Sedation Documentation (Signed)
Pt admitted to 4N28. Groin and pulses assessed with Darla, RN and Edwena Felty, RN. No change, see flowsheet.

## 2019-12-27 NOTE — ED Provider Notes (Signed)
Patient left at change of shift, patient came in last seen normal around 10 PM and then family heard her fall.  She has a history of cocaine abuse and was given Narcan with no response.  She appears to have a right hemiparesis.  She has had a CT of the head and a CTA that is pending.  23:49 PM radiology called her CTA results which showed a large LVO  12:00 AM teleneurology called me back and wants patient to go to IR, they are going to call to get that started.  12:01 a.m. Dr. Cheral Marker, neurology called me back.  He has asked me to witness family member giving permission for IR to do embolectomy.  12:02 AM [name] Matkins-Barnett, sister talked to Dr Cheral Marker and gives permission for IR to do embolectomy. Her phone number is [phone]  Patient was noted to be in atrial flutter on the monitor.  EKG was done which showed atrial flutter with rate 117.  Blood pressure is 186/103.  This was relayed to Dr. Cheral Marker.  He was on the phone with IR and they have asked that we give a beta-blocker, patient was given Lopressor 5 mg IV.  When I talked to the patient she moves her eyes however she does not verbally respond or follow commands.  When I lift her right arm and her right lower extremity they are flaccid.  When I lift her left upper extremity she will grab my hand.  She is noted to spontaneously flex her left knee.  Dr. Cheral Marker wants patient to be taken to the bridge and then will go straight to IR.  ED ECG REPORT   Date: 12/27/2019  Rate: 117  Rhythm: atrial flutter  QRS Axis: normal  Intervals: normal  ST/T Wave abnormalities: normal  Conduction Disutrbances:none  Narrative Interpretation: PRWP  Old EKG Reviewed: none available  I have personally reviewed the EKG tracing and agree with the computerized printout as noted.    Rolland Porter, MD 12/27/19 [phone]

## 2019-12-27 NOTE — Progress Notes (Signed)
SLP Cancellation Note  Patient Details Name: Robin [name] MRN: [id number] DOB: 1965/12/27   Cancelled treatment:       Reason Eval/Treat Not Completed: Patient not medically ready   Jameya Pontiff, Katherene Ponto 12/27/2019, 7:58 AM

## 2019-12-27 NOTE — Procedures (Addendum)
Neuro-Interventional Radiology  Post Cerebral Angiogram Procedure Note  History:   History: 54 yo female presents with acute stroke symptoms to [facility] ED.    Neurology/teleneurology work-up reveals ELVO of left M1 occlusion.  53 y/o woman with h/o HTN, DM, and atrial fibrillation (on Elliquis - unknown last dose). who presents to the ED with AMS and right arm and facial weakness. Neuro-IR to be consulted for LVO.  Baseline mRS:  0 NIHSS:   73 Last Known Well:  12/26/19, 22:00 ASPECTS:   6 Site of occlusion:  ICA terminus   Skin Puncture:   1:14am First Pass Date & Time: 1:24am  Device:  ADAPT strategy, with zoom 71 catheter  Result:  TICI 0, MCA territory Second Pass   1:34am  Device:   4x 40 solitaire with CAT 6 local aspiration   Result:  TICI 3  IV tPA administered?: no IA Medication:  no   Anesthesia    GETA  Procedure:  - US guided R CFA access - Cerebral Angiogram - Mechanical thrombectomy of ELVO involving left ICA terminus - Deployment of Angioseal for hemostasis - Flat panel CT in NIR suite  Findings:  Left ICA terminus occlusion No flow into the ACA or MCA on start.  pcomm patent.  2 passes achieved TICI 3 into the ACA and MCA territory CT shows no hemorrhage, no stain  Complications: None  EBL: 50cc  Disposition: To 4N 28  Recommendations: - right hip straight overnight - Goal SBP 120-140.   - patient to remain intubated - Frequent NV checks - Repeat CT or MRI imaging recommended within 36 hours, discretion of Neurology - NIR to follow   Signed,  Dulcy Fanny. Earleen Newport, DO

## 2019-12-27 NOTE — ED Notes (Signed)
Pt belongings given to sister, [name].

## 2019-12-28 ENCOUNTER — Inpatient Hospital Stay (HOSPITAL_COMMUNITY): Payer: 59

## 2019-12-28 ENCOUNTER — Encounter: Payer: Self-pay | Admitting: *Deleted

## 2019-12-28 LAB — BASIC METABOLIC PANEL
Anion gap: 9 (ref 5–15)
BUN: 18 mg/dL (ref 6–20)
CO2: 25 mmol/L (ref 22–32)
Calcium: 8.5 mg/dL — ABNORMAL LOW (ref 8.9–10.3)
Chloride: 107 mmol/L (ref 98–111)
Creatinine, Ser: 1.07 mg/dL — ABNORMAL HIGH (ref 0.44–1.00)
GFR calc Af Amer: >60 mL/min (ref >60)
GFR calc non Af Amer: 59 mL/min — ABNORMAL LOW (ref >60)
Glucose, Bld: 160 mg/dL — ABNORMAL HIGH (ref 70–99)
Potassium: 4 mmol/L (ref 3.5–5.1)
Sodium: 141 mmol/L (ref 135–145)

## 2019-12-28 LAB — GLUCOSE, CAPILLARY
Glucose-Capillary: 132 mg/dL — ABNORMAL HIGH (ref 70–99)
Glucose-Capillary: 187 mg/dL — ABNORMAL HIGH (ref 70–99)
Glucose-Capillary: 191 mg/dL — ABNORMAL HIGH (ref 70–99)
Glucose-Capillary: 209 mg/dL — ABNORMAL HIGH (ref 70–99)
Glucose-Capillary: 253 mg/dL — ABNORMAL HIGH (ref 70–99)
Glucose-Capillary: 261 mg/dL — ABNORMAL HIGH (ref 70–99)
Glucose-Capillary: 261 mg/dL — ABNORMAL HIGH (ref 70–99)

## 2019-12-28 LAB — URINE CULTURE: Culture: NO GROWTH

## 2019-12-28 LAB — MAGNESIUM
Magnesium: 2.2 mg/dL (ref 1.7–2.4)
Magnesium: 2.1 mg/dL (ref 1.7–2.4)

## 2019-12-28 LAB — SODIUM
Sodium: 142 mmol/L (ref 135–145)
Sodium: 148 mmol/L — ABNORMAL HIGH (ref 135–145)

## 2019-12-28 LAB — PHOSPHORUS
Phosphorus: 3.3 mg/dL (ref 2.5–4.6)
Phosphorus: 2.5 mg/dL (ref 2.5–4.6)

## 2019-12-28 IMAGING — CT CT HEAD W/O CM
4 series · 17 of 47 positions shown, 19 images · non-contrast
Comparison: Brain MRI from yesterday

CLINICAL DATA: Stroke follow-up

EXAM:
CT HEAD WITHOUT CONTRAST
TECHNIQUE: Contiguous axial images were obtained from the base of the skull
through the vertex without intravenous contrast.

[Series 3: head wo · axial · 0.43mm/px · z∈[-91,+29]mm · 7 of 33 slices shown, 9 images]
[im 5/33  brain]
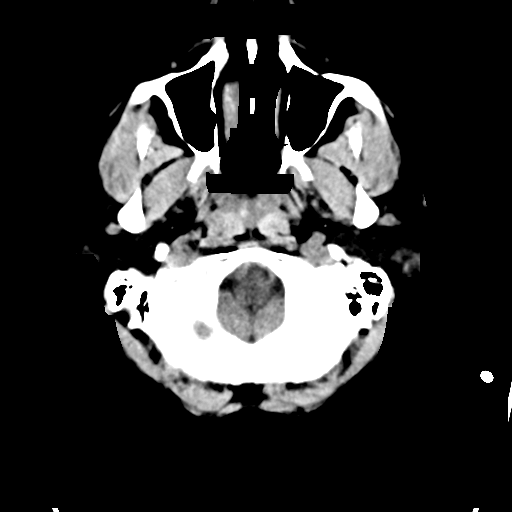
[im 5/33  bone]
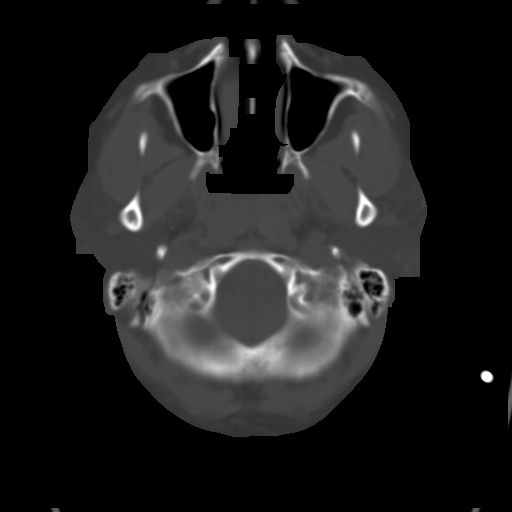
[im 9/33  brain]
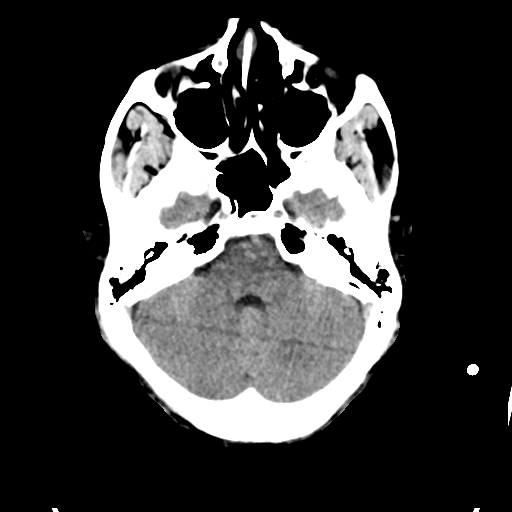
[im 13/33  brain]
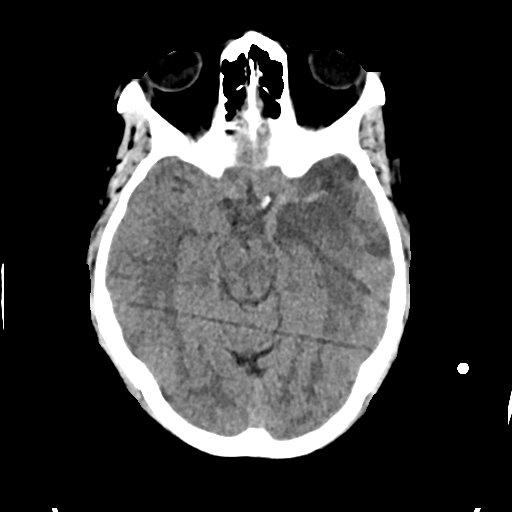
[im 17/33  brain]
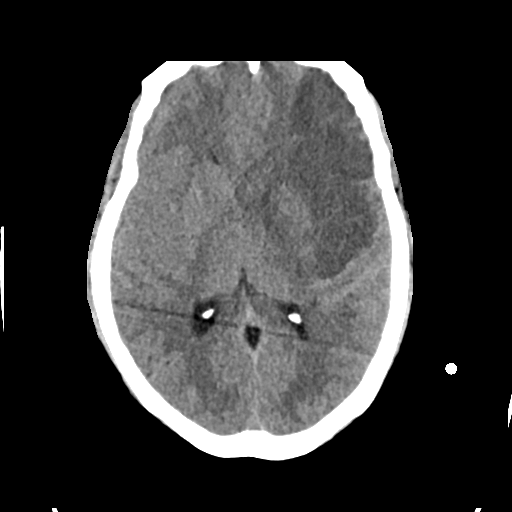
[im 21/33  brain]
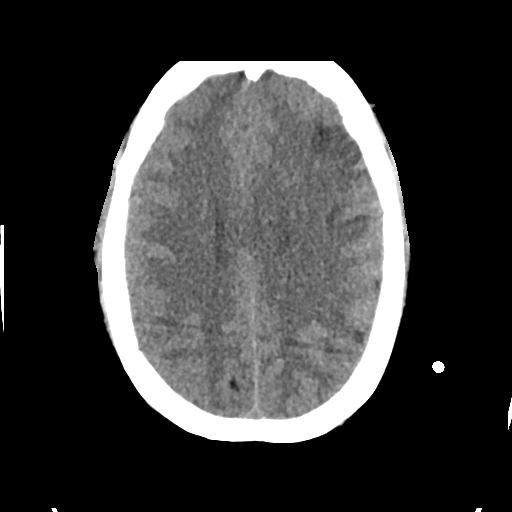
[im 21/33  bone]
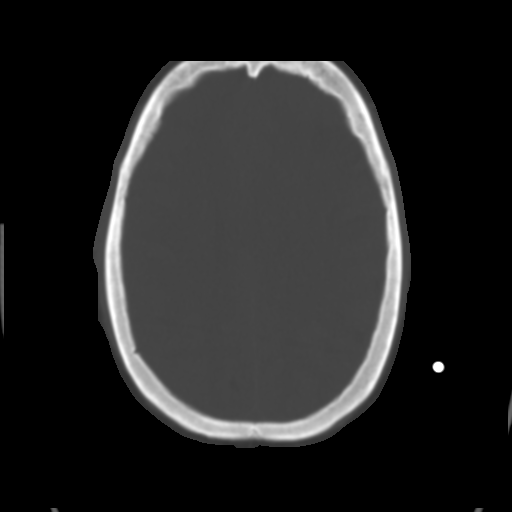
[im 25/33  brain]
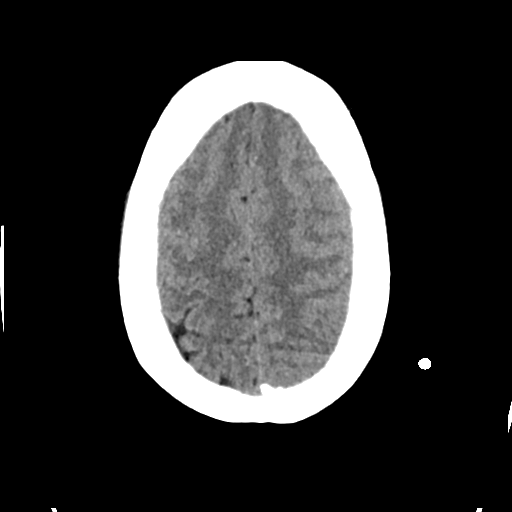
[im 29/33  brain]
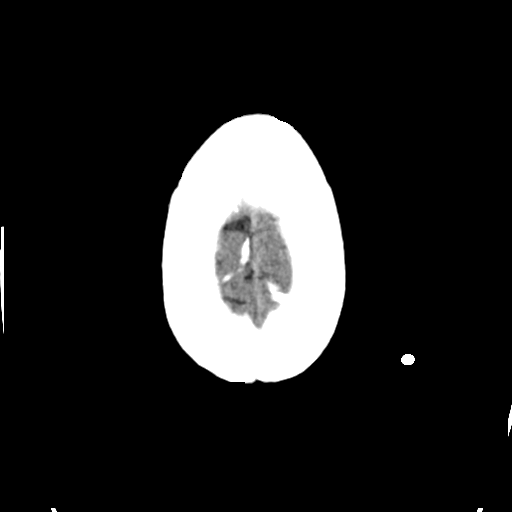

[Series 4: head bone · axial · 0.43mm/px · z∈[-95,-39]mm · 4 of 82 slices shown]
[im 9/82  bone]
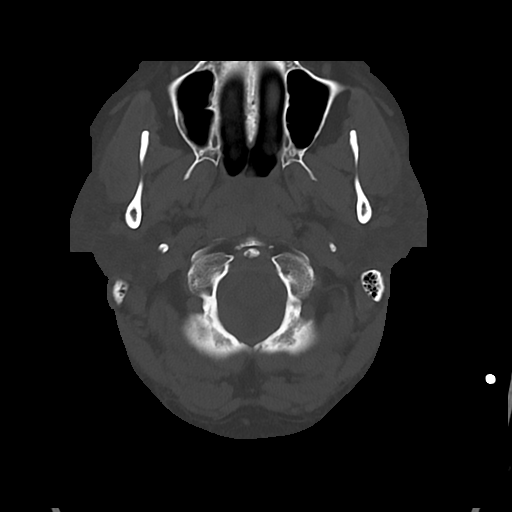
[im 17/82  bone]
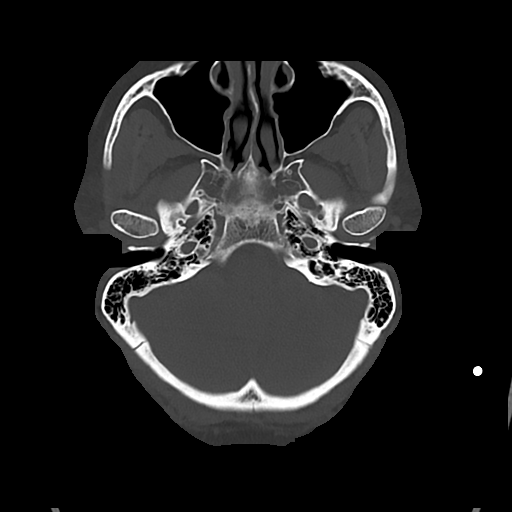
[im 25/82  bone]
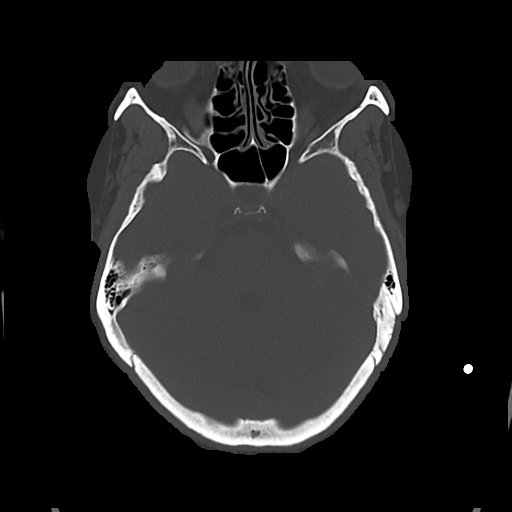
[im 37/82  bone]
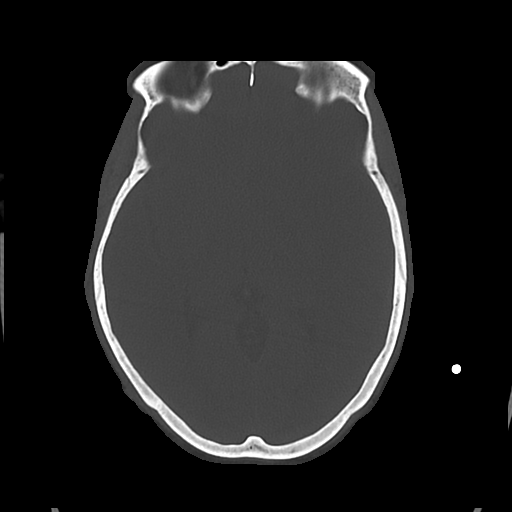

[Series 5: cor soft · coronal · 0.34mm/px · 3 of 70 slices shown]
[im 24/70  brain]
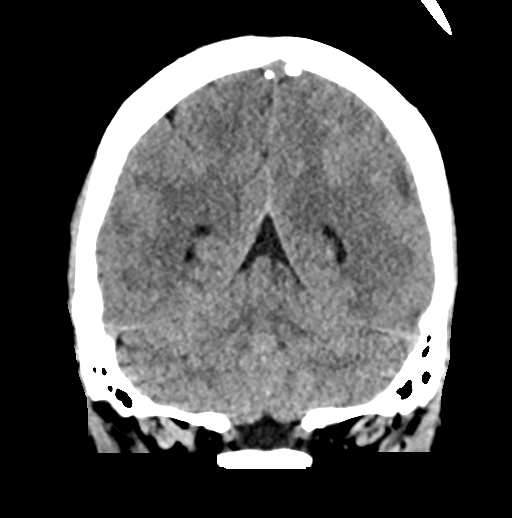
[im 31/70  brain]
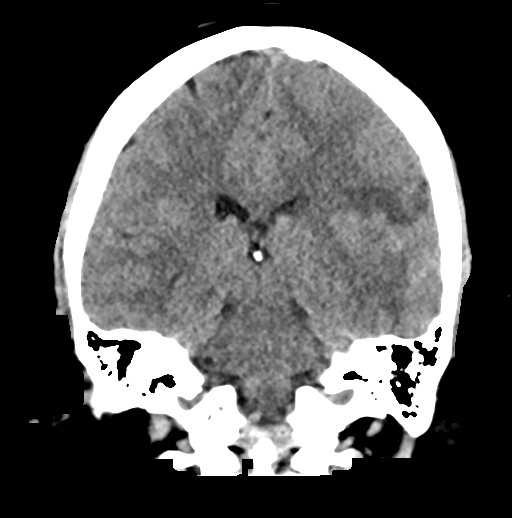
[im 39/70  brain]
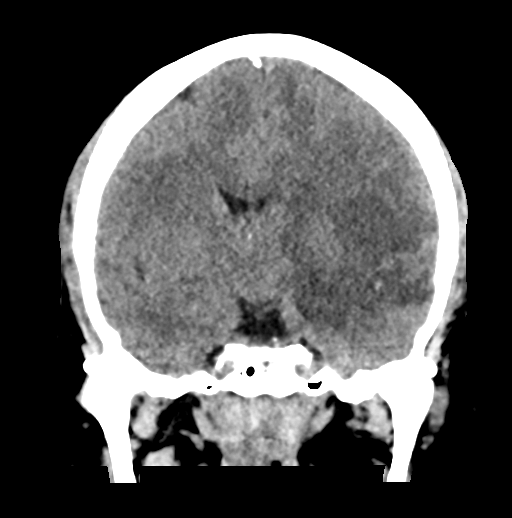

[Series 6: sag soft · sagittal · 0.34mm/px · 3 of 59 slices shown]
[im 20/59  brain]
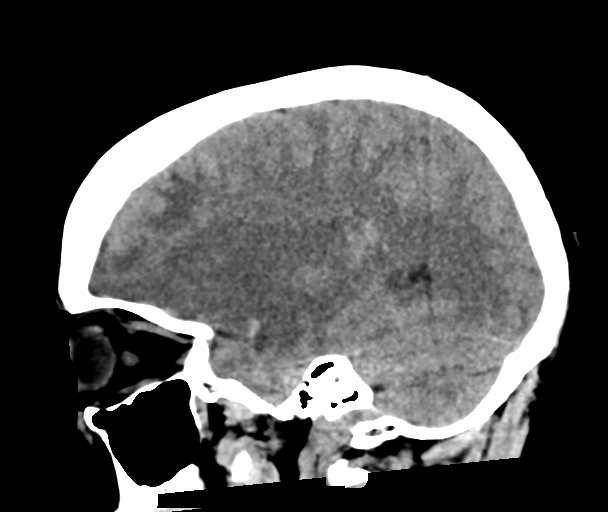
[im 30/59  brain]
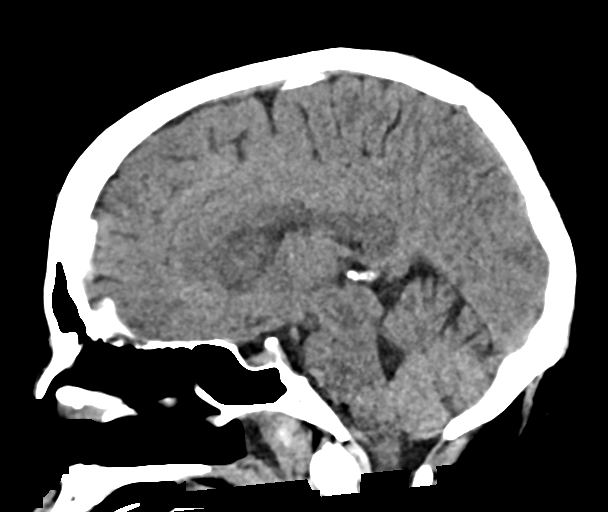
[im 39/59  brain]
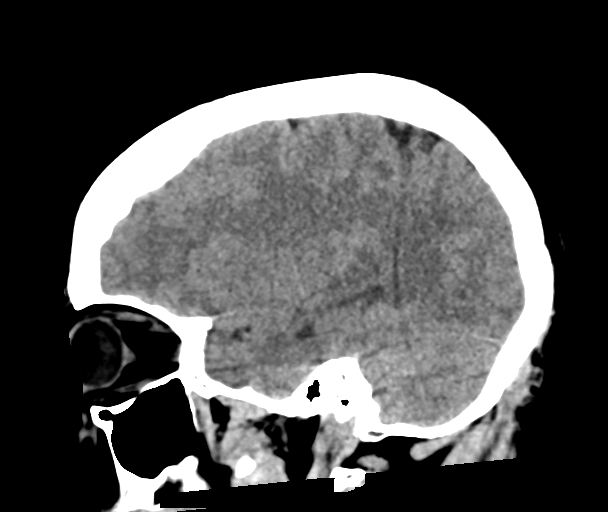

[17 of 47 positions shown; findings below may reference images not displayed]

FINDINGS: Brain: Cytotoxic edema in the anterior left MCA division and basal
ganglia. There is petechial hemorrhage better seen by prior MRI. No
discrete hematoma. Swelling causes 7 mm of midline shift. No
entrapment.

Vascular: Dense appearance of the left MCA which may be related to
adjacent edematous brain.

Skull: Negative

Sinuses/Orbits: Negative
IMPRESSION: Acute left MCA territory infarct with petechial hemorrhage. No
evident progression from brain MRI yesterday. Midline shift measures
7 mm.

## 2019-12-28 MED ORDER — POTASSIUM CHLORIDE 20 MEQ PO PACK
20.0000 meq | PACK | Freq: Every day | ORAL | Status: DC
  Administered 2019-12-28 – 2020-01-01 (×5): 20 meq
  Filled 2019-12-28 (×5): qty 1

## 2019-12-28 MED ORDER — TORSEMIDE 20 MG PO TABS
20.0000 mg | ORAL_TABLET | Freq: Every day | ORAL | Status: DC
  Administered 2019-12-29 – 2020-01-05 (×8): 20 mg
  Filled 2019-12-28 (×8): qty 1

## 2019-12-28 MED ORDER — PANTOPRAZOLE SODIUM 40 MG PO PACK
40.0000 mg | PACK | Freq: Every day | ORAL | Status: DC
  Administered 2019-12-28 – 2020-01-05 (×9): 40 mg
  Filled 2019-12-28 (×9): qty 20

## 2019-12-28 MED ORDER — SODIUM CHLORIDE 0.9 % IV SOLN: INTRAVENOUS | Status: DC | PRN

## 2019-12-28 MED ORDER — SODIUM CHLORIDE 3 % IV SOLN
INTRAVENOUS | Status: DC
  Administered 2019-12-28 (×2): 75 mL/h via INTRAVENOUS
  Administered 2019-12-28: 20 mL/h via INTRAVENOUS
  Administered 2019-12-29: 75 mL/h via INTRAVENOUS
  Filled 2019-12-28 (×6): qty 500

## 2019-12-28 MED ORDER — HEPARIN SODIUM (PORCINE) 5000 UNIT/ML IJ SOLN
5000.0000 [IU] | Freq: Three times a day (TID) | INTRAMUSCULAR | Status: DC
  Administered 2019-12-28 – 2019-12-31 (×9): 5000 [IU] via SUBCUTANEOUS
  Filled 2019-12-28 (×9): qty 1

## 2019-12-28 NOTE — Progress Notes (Signed)
SLP Cancellation Note  Patient Details Name: Robin [name] MRN: [id number] DOB: 1966/01/30   Cancelled treatment:       Reason Eval/Treat Not Completed: Medical issues which prohibited therapy. Pt remains on vent. Will f/u as able.     Osie Bond., M.A. [place] [facility] Pager [phone] Office [phone]  12/28/2019, 7:03 AM

## 2019-12-28 NOTE — Progress Notes (Signed)
Referring Physician(s): Code Stroke- Kerney Elbe  Supervising Physician: Corrie Mckusick  Patient Status:  [facility] - In-pt  Chief Complaint: None- intubated without sedation  Subjective:  History of acute CVA s/p cerebral arteriogram with emergent mechanical thrombectomy of left ICA terminus occlusion achieving a TICI 3 revascularization via right femoral approach 12/27/2019 by Dr. Earleen Newport. Patient laying in bed intubated without sedation. Accompanied by family at bedside. She does not open eyes to voice/painful stimuli and does not follow simple commands. Can spontaneously move left side, RLE withdraws from pain, no movements of RUE. Right groin incision c/d/i.  MR brain 12/27/2019: 1. Evolving left MCA territory infarct with hemorrhagic transformation. Mass effect with 6 mm rightward midline shift. 2. Normal flow voids noting a 3 mm left MCA bifurcation aneurysm and a 7 mm paraclinoid right ICA aneurysm.  CT head 12/28/2019: 1. Acute left MCA territory infarct with petechial hemorrhage. No evident progression from brain MRI yesterday. Midline shift measures 7 mm.   Allergies: Bee venom, Losartan, Naproxen, Penicillins, and Lisinopril  Medications: Prior to Admission medications   Medication Sig Start Date End Date Taking? Authorizing Provider  acetaminophen (TYLENOL) 325 MG tablet Take 2 tablets (650 mg total) by mouth every 6 (six) hours as needed for mild pain, fever or headache (or Fever >/= 101). 05/14/19   Roxan Hockey, MD  albuterol (PROVENTIL) (2.5 MG/3ML) 0.083% nebulizer solution Take 3 mLs (2.5 mg total) by nebulization every 6 (six) hours as needed for wheezing or shortness of breath. 05/14/19   Roxan Hockey, MD  albuterol (VENTOLIN HFA) 108 (90 Base) MCG/ACT inhaler Inhale 2 puffs into the lungs every 6 (six) hours as needed for wheezing or shortness of breath. 05/14/19   Roxan Hockey, MD  apixaban (ELIQUIS) 5 MG TABS tablet Take 1 tablet (5 mg total) by mouth  2 (two) times daily. 05/14/19 06/20/20  Roxan Hockey, MD  carvedilol (COREG) 25 MG tablet Take 2 tablets (50 mg total) by mouth 2 (two) times daily with a meal. 11/24/19   Strader, [place] M, PA-C  cephALEXin (KEFLEX) 500 MG capsule Take 1 capsule (500 mg total) by mouth 3 (three) times daily. 12/03/19   Inda Coke, PA  digoxin (LANOXIN) 0.125 MG tablet Take 1 tablet (0.125 mg total) by mouth daily. 11/29/19   Strader, Fransisco Hertz, PA-C  Dulaglutide (TRULICITY) [id number] [id number] SOPN Inject 0.75 mg into the skin once a week. 09/13/19   Shamleffer, Melanie Crazier, MD  gabapentin (NEURONTIN) 100 MG capsule Take 1 capsule (100 mg total) by mouth 3 (three) times daily. 05/14/19   Roxan Hockey, MD  glucose blood (CONTOUR NEXT TEST) test strip 2x daily 06/09/19   Shamleffer, Melanie Crazier, MD  hydrALAZINE (APRESOLINE) 50 MG tablet Take 1 tablet (50 mg total) by mouth 3 (three) times daily. 05/14/19   Roxan Hockey, MD  insulin aspart protamine - aspart (NOVOLOG MIX 70/30 FLEXPEN) (70-30) 100 UNIT/ML FlexPen Inject 0.3 mLs (30 Units total) into the skin 2 (two) times daily. Patient taking differently: Inject into the skin 2 (two) times daily. 20 units in am and 30 in the pm 08/27/19   Shamleffer, Melanie Crazier, MD  Insulin Pen Needle 32G X 4 MM MISC 1 Device by Does not apply route as directed. 08/27/19   Shamleffer, Melanie Crazier, MD  metFORMIN (GLUCOPHAGE) 1000 MG tablet Take 1 tablet (1,000 mg total) by mouth daily with breakfast. 06/09/19   Shamleffer, Melanie Crazier, MD  pantoprazole (PROTONIX) 40 MG tablet Take 1 tablet (40 mg total)  by mouth daily. 11/29/19   Inda Coke, PA  potassium chloride SA (KLOR-CON) 20 MEQ tablet Take 1 tablet (20 mEq total) by mouth 2 (two) times daily. 05/14/19   Roxan Hockey, MD  tizanidine (ZANAFLEX) 2 MG capsule Take 1 capsule (2 mg total) by mouth 2 (two) times daily as needed for muscle spasms. 09/16/19   Lucretia Kern, DO  torsemide (DEMADEX) 20 MG  tablet Take 1 tablet (20 mg total) by mouth 2 (two) times daily. 11/29/19   Ahmed Prima, Fransisco Hertz, PA-C  triamcinolone cream (KENALOG) 0.1 % Apply to affected area one to times daily 12/03/19   Inda Coke, [place]     Vital Signs: BP 109/84   Pulse (!) 103   Temp 98.4 F (36.9 C) (Oral)   Resp 13   Ht 5\' 6"  (1.676 m)   Wt 222 lb 3.6 oz (100.8 kg)   LMP 08/21/2011   SpO2 100%   BMI 35.87 kg/m   Physical Exam Vitals and nursing note reviewed.  Constitutional:      General: She is not in acute distress.    Comments: Intubated without sedation.   Pulmonary:     Effort: Pulmonary effort is normal. No respiratory distress.     Comments: Intubated without sedation.  Skin:    General: Skin is warm and dry.     Comments: Right groin incision soft without active bleeding or hematoma.  Neurological:     Comments: Intubated without sedation. She does not open eyes to voice/painful stimuli and does not follow simple commands. PERRL bilaterally. Left gaze preference. Can spontaneously move left side, RLE withdraws from pain, no movements of RUE. Distal pulses (DPs) palpable bilaterally with Doppler.      Imaging: CT Angio Head W or Wo Contrast  Result Date: 12/27/2019 CLINICAL DATA:  Initial evaluation for acute stroke, unresponsiveness. EXAM: CT ANGIOGRAPHY HEAD AND NECK CT PERFUSION BRAIN TECHNIQUE: Multidetector CT imaging of the head and neck was performed using the standard protocol during bolus administration of intravenous contrast. Multiplanar CT image reconstructions and MIPs were obtained to evaluate the vascular anatomy. Carotid stenosis measurements (when applicable) are obtained utilizing NASCET criteria, using the distal internal carotid diameter as the denominator. Multiphase CT imaging of the brain was performed following IV bolus contrast injection. Subsequent parametric perfusion maps were calculated using RAPID software. CONTRAST:  135mL OMNIPAQUE IOHEXOL 350 MG/ML SOLN  COMPARISON:  Prior head CT from earlier the same day. FINDINGS: CTA NECK FINDINGS Aortic arch: Visualized aortic arch of normal caliber with normal branch pattern. Mild atheromatous change about the arch and origin of the great vessels without stenosis. Visualized subclavian arteries widely patent. Right carotid system: Right common and internal carotid arteries widely patent without stenosis, dissection or occlusion. Left carotid system: Left common carotid artery patent from its origin to the bifurcation without stenosis. Mild atheromatous change about the left bifurcation without significant stenosis. Left ICA widely patent distally to the skull base without stenosis, dissection or occlusion. Vertebral arteries: Both vertebral arteries arise from the subclavian arteries. Right vertebral artery dominant, with a diffusely hypoplastic left vertebral artery. Multifocal moderate to severe segmental stenoses seen involving the hypoplastic left vertebral artery, most pronounced proximally. On the right, there is a single short-segment severe stenosis involving the right V1 segment (series 4, image 272). Otherwise, right vertebral artery patent within the neck without stenosis or other abnormality. Skeleton: No acute osseous finding. Focal sclerotic lesion within the T2 vertebral body noted, nonspecific, but likely benign. No  other worrisome lytic or blastic osseous lesions. Other neck: No other acute soft tissue abnormality within the neck. No mass lesion or adenopathy. 7 mm left thyroid nodule noted, felt to be of doubtful significance given size and patient age. No follow-up imaging recommended regarding this lesion. Upper chest: Visualized upper chest demonstrates no acute finding. Partially visualized lungs are largely clear. Review of the MIP images confirms the above findings CTA HEAD FINDINGS Anterior circulation: On the right, the petrous, cavernous, and supraclinoid segments are widely patent to the terminus. 5  x 6 x 7 mm saccular aneurysm seen arising from the para clinoid right ICA (series 4, image 116). This is directed medially and posteriorly. On the left, patent but somewhat attenuated flow within the petrous and cavernous left ICA, with subsequent occlusion at the supraclinoid segment/left ICA terminus (series 4, image 115). A1 segments are opacified bilaterally, with filling of the left A1 via collateralization across the circle-of-Willis. A2 segments well perfused to their distal aspects. Opacification of the mid and distal left M1 segment is seen, which could be related to subocclusive thrombus and/or collateralization. Normal left MCA bifurcation. Attenuated flow seen within the left MCA branches distally, likely via collateralization. Right M1 widely patent. Normal right MCA bifurcation. Distal right MCA branches well perfused. Small vessel atheromatous irregularity noted. Posterior circulation: Vertebral arteries patent to the vertebrobasilar junction without stenosis. Both picas patent. Basilar diminutive but patent to its distal aspect without stenosis. Superior cerebral arteries patent at their origins. Both PCAs primarily supplied via the basilar well perfused to their distal aspects. Venous sinuses: Grossly patent allowing for timing of the contrast bolus. Anatomic variants: None significant. Review of the MIP images confirms the above findings CT Brain Perfusion Findings: ASPECTS: 6 CBF (<30%) Volume: 31mL Perfusion (Tmax>6.0s) volume: 28mL Mismatch Volume: 270mL Infarction Location:Patchy core infarct seen involving the deep and subcortical white matter of the anterior left frontal lobe. Large surrounding ischemic penumbra throughout the majority of the left MCA distribution. IMPRESSION: CTA HEAD AND NECK IMPRESSION: 1. Positive CTA with occlusive thrombus at the left ICA terminus. Distal reconstitution at the mid and distal left M1 segment with attenuated flow distally within the left MCA branches,  which could be related to subocclusive thrombus and/or collateralization. 2. Mild atheromatous change about the left carotid bifurcation without stenosis. No other hemodynamically significant stenosis involving the left carotid system. 3. Multifocal moderate to severe stenoses involving the hypoplastic left vertebral artery, most notable proximally. Single short-segment severe proximal right V1 stenosis. Dominant right vertebral artery otherwise widely patent. 4. 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 CT PERFUSION IMPRESSION: 1. 24 cc acute core infarct involving the deep and subcortical white matter of the anterior left frontal region. Two hundred fourteen cc surrounding ischemic penumbra involving essentially the entirety of the left MCA distribution. 2. Aspects = 6 on prior head CT. Critical Value/emergent results were called by telephone at the time of interpretation on 12/26/2019 at 11:48 pm to provider Dr. Tomi Bamberger, Who verbally acknowledged these results. Critical Value/emergent results were called by telephone at the time of interpretation on 12/26/2019 at 11:54 pm to provider Dr. Cheral Marker, Who verbally acknowledged these results. Electronically Signed   By: Jeannine Boga M.D.   On: 12/27/2019 00:41   CT Head Wo Contrast  Result Date: 12/28/2019 CLINICAL DATA:  Stroke follow-up EXAM: CT HEAD WITHOUT CONTRAST TECHNIQUE: Contiguous axial images were obtained from the base of the skull through the vertex without intravenous  contrast. COMPARISON:  Brain MRI from yesterday FINDINGS: Brain: Cytotoxic edema in the anterior left MCA division and basal ganglia. There is petechial hemorrhage better seen by prior MRI. No discrete hematoma. Swelling causes 7 mm of midline shift. No entrapment. Vascular: Dense appearance of the left MCA which may be related to adjacent edematous brain. Skull: Negative Sinuses/Orbits: Negative IMPRESSION: Acute left MCA territory infarct with petechial  hemorrhage. No evident progression from brain MRI yesterday. Midline shift measures 7 mm. Electronically Signed   By: Monte Fantasia M.D.   On: 12/28/2019 04:29   CT Angio Neck W and/or Wo Contrast  Result Date: 12/27/2019 CLINICAL DATA:  Initial evaluation for acute stroke, unresponsiveness. EXAM: CT ANGIOGRAPHY HEAD AND NECK CT PERFUSION BRAIN TECHNIQUE: Multidetector CT imaging of the head and neck was performed using the standard protocol during bolus administration of intravenous contrast. Multiplanar CT image reconstructions and MIPs were obtained to evaluate the vascular anatomy. Carotid stenosis measurements (when applicable) are obtained utilizing NASCET criteria, using the distal internal carotid diameter as the denominator. Multiphase CT imaging of the brain was performed following IV bolus contrast injection. Subsequent parametric perfusion maps were calculated using RAPID software. CONTRAST:  191mL OMNIPAQUE IOHEXOL 350 MG/ML SOLN COMPARISON:  Prior head CT from earlier the same day. FINDINGS: CTA NECK FINDINGS Aortic arch: Visualized aortic arch of normal caliber with normal branch pattern. Mild atheromatous change about the arch and origin of the great vessels without stenosis. Visualized subclavian arteries widely patent. Right carotid system: Right common and internal carotid arteries widely patent without stenosis, dissection or occlusion. Left carotid system: Left common carotid artery patent from its origin to the bifurcation without stenosis. Mild atheromatous change about the left bifurcation without significant stenosis. Left ICA widely patent distally to the skull base without stenosis, dissection or occlusion. Vertebral arteries: Both vertebral arteries arise from the subclavian arteries. Right vertebral artery dominant, with a diffusely hypoplastic left vertebral artery. Multifocal moderate to severe segmental stenoses seen involving the hypoplastic left vertebral artery, most  pronounced proximally. On the right, there is a single short-segment severe stenosis involving the right V1 segment (series 4, image 272). Otherwise, right vertebral artery patent within the neck without stenosis or other abnormality. Skeleton: No acute osseous finding. Focal sclerotic lesion within the T2 vertebral body noted, nonspecific, but likely benign. No other worrisome lytic or blastic osseous lesions. Other neck: No other acute soft tissue abnormality within the neck. No mass lesion or adenopathy. 7 mm left thyroid nodule noted, felt to be of doubtful significance given size and patient age. No follow-up imaging recommended regarding this lesion. Upper chest: Visualized upper chest demonstrates no acute finding. Partially visualized lungs are largely clear. Review of the MIP images confirms the above findings CTA HEAD FINDINGS Anterior circulation: On the right, the petrous, cavernous, and supraclinoid segments are widely patent to the terminus. 5 x 6 x 7 mm saccular aneurysm seen arising from the para clinoid right ICA (series 4, image 116). This is directed medially and posteriorly. On the left, patent but somewhat attenuated flow within the petrous and cavernous left ICA, with subsequent occlusion at the supraclinoid segment/left ICA terminus (series 4, image 115). A1 segments are opacified bilaterally, with filling of the left A1 via collateralization across the circle-of-Willis. A2 segments well perfused to their distal aspects. Opacification of the mid and distal left M1 segment is seen, which could be related to subocclusive thrombus and/or collateralization. Normal left MCA bifurcation. Attenuated flow seen within the left MCA  branches distally, likely via collateralization. Right M1 widely patent. Normal right MCA bifurcation. Distal right MCA branches well perfused. Small vessel atheromatous irregularity noted. Posterior circulation: Vertebral arteries patent to the vertebrobasilar junction  without stenosis. Both picas patent. Basilar diminutive but patent to its distal aspect without stenosis. Superior cerebral arteries patent at their origins. Both PCAs primarily supplied via the basilar well perfused to their distal aspects. Venous sinuses: Grossly patent allowing for timing of the contrast bolus. Anatomic variants: None significant. Review of the MIP images confirms the above findings CT Brain Perfusion Findings: ASPECTS: 6 CBF (<30%) Volume: 40mL Perfusion (Tmax>6.0s) volume: 256mL Mismatch Volume: 227mL Infarction Location:Patchy core infarct seen involving the deep and subcortical white matter of the anterior left frontal lobe. Large surrounding ischemic penumbra throughout the majority of the left MCA distribution. IMPRESSION: CTA HEAD AND NECK IMPRESSION: 1. Positive CTA with occlusive thrombus at the left ICA terminus. Distal reconstitution at the mid and distal left M1 segment with attenuated flow distally within the left MCA branches, which could be related to subocclusive thrombus and/or collateralization. 2. Mild atheromatous change about the left carotid bifurcation without stenosis. No other hemodynamically significant stenosis involving the left carotid system. 3. Multifocal moderate to severe stenoses involving the hypoplastic left vertebral artery, most notable proximally. Single short-segment severe proximal right V1 stenosis. Dominant right vertebral artery otherwise widely patent. 4. 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 CT PERFUSION IMPRESSION: 1. 24 cc acute core infarct involving the deep and subcortical white matter of the anterior left frontal region. Two hundred fourteen cc surrounding ischemic penumbra involving essentially the entirety of the left MCA distribution. 2. Aspects = 6 on prior head CT. Critical Value/emergent results were called by telephone at the time of interpretation on 12/26/2019 at 11:48 pm to provider Dr. Tomi Bamberger, Who verbally  acknowledged these results. Critical Value/emergent results were called by telephone at the time of interpretation on 12/26/2019 at 11:54 pm to provider Dr. Cheral Marker, Who verbally acknowledged these results. Electronically Signed   By: Jeannine Boga M.D.   On: 12/27/2019 00:41   MR BRAIN WO CONTRAST  Result Date: 12/27/2019 CLINICAL DATA:  Stroke follow-up; status post left MCA thrombectomy EXAM: MRI HEAD WITHOUT CONTRAST TECHNIQUE: Multiplanar, multiecho pulse sequences of the brain and surrounding structures were obtained without intravenous contrast. COMPARISON:  Head CT Dec 26, 2019 FINDINGS: Brain: Large area of restricted diffusion involving the left basal ganglia, insula, anterior temporal lobe including amygdala, inferior frontal lobe with cortical restricted diffusion in the lateral frontal and parietal regions, consistent with evolving left MCA territory infarct. There is susceptibility artifact along the cortex and basal ganglia, consistent with hemorrhagic transformation. There is mass effect with effacement of the regional sulci and partial effacement of the left lateral ventricle with a 6 mm rightward midline shift. There is no hydrocephalus. Remainder of the brain parenchyma is maintained. Vascular: Normal flow voids noting a 3 mm left MCA bifurcation aneurysm and a 7 mm paraclinoid right ICA aneurysm. Skull and upper cervical spine: Normal marrow signal. Sinuses/Orbits: Mild mucosal thickening of the ethmoid cells. The orbits are maintained. Other: Endotracheal tube noted. IMPRESSION: 1. Evolving left MCA territory infarct with hemorrhagic transformation. Mass effect with 6 mm rightward midline shift. 2. Normal flow voids noting a 3 mm left MCA bifurcation aneurysm and a 7 mm paraclinoid right ICA aneurysm. Electronically Signed   By: Pedro Earls M.D.   On: 12/27/2019 15:29  [place]  Result Date: 12/27/2019 INDICATION: 54 year old female with a history of acute  left MCA stroke, ELVO, presenting for mechanical thrombectomy EXAM: ULTRASOUND GUIDED ACCESS RIGHT COMMON FEMORAL ARTERY CERVICAL AND CEREBRAL ANGIOGRAM MECHANICAL THROMBECTOMY LEFT ICA ANGIO-SEAL FOR HEMOSTASIS COMPARISON:  CT IMAGING SAME DAY MEDICATIONS: NONE ANESTHESIA/SEDATION: The anesthesia team was present to provide general endotracheal tube anesthesia and for patient monitoring during the procedure. Intubation was performed in Neuro biplane room. Radial arterial line was performed by the anesthesia team. interventional neuro radiology nursing staff was also present. CONTRAST:  50 cc Omnipaque 300 FLUOROSCOPY TIME:  Fluoroscopy Time: 8 minutes 48 seconds (901 mGy). COMPLICATIONS: None TECHNIQUE: Informed written consent was obtained from the patient's family after a thorough discussion of the procedural risks, benefits and alternatives. Specific risks discussed include: Bleeding, infection, contrast reaction, kidney injury/failure, need for further procedure/surgery, arterial injury or dissection, embolization to new territory, intracranial hemorrhage (10-15% risk), neurologic deterioration, cardiopulmonary collapse, death. All questions were addressed. Maximal Sterile Barrier Technique was utilized including during the procedure including caps, mask, sterile gowns, sterile gloves, sterile drape, hand hygiene and skin antiseptic. A timeout was performed prior to the initiation of the procedure. The anesthesia team was present to provide general endotracheal tube anesthesia and for patient monitoring during the procedure. Interventional neuro radiology nursing staff was also present. FINDINGS: Initial Findings: Left internal carotid artery: Initial angiogram demonstrates ICA terminus occlusion, without significant plaque of the petrous segment, last seroma segment, cavernous segment or the proximal supraclinoid segment. Ophthalmic artery is patent, with choroid blush. Patent posterior communicating artery  with flow into the left PCA. Left MCA: Initial imaging demonstrates no significant primary filling of the MCA. There are leptomeningeal collateral flows partially filling the superior division of the MCA territory. Anterior choroidal artery remains patent with some anterior-posterior flow through the choroidal system into the parietal territory. Left ACA: Initial imaging demonstrates no significant primary filling of the ACA territory. Completion Findings: Left MCA: After 2 passes there is complete restoration of flow through the ICA terminus, incomplete restoration of flow through the right MCA branches. Left ACA: There is restoration of flow into the A1 segment. The right ACA territory is partially served by left-sided ACA branches in the pericallosal distribution. TICI 3 flow restored Flat panel CT demonstrates no subarachnoid hemorrhage or significant contrast staining. PROCEDURE: The anesthesia team was present to provide general endotracheal tube anesthesia and for patient monitoring during the procedure. Intubation was performed in negative pressure [place] in neuro IR holding. Interventional neuro radiology nursing staff was also present. Ultrasound survey of the right inguinal region was performed with images stored and sent to PACs. 11 blade scalpel was used to make a small incision. Blunt dissection was performed with US guidance. A micropuncture needle was used access the right common femoral artery under ultrasound. With excellent arterial blood flow returned, an .018 micro wire was passed through the needle, observed to enter the abdominal aorta under fluoroscopy. The needle was removed, and a micropuncture sheath was placed over the wire. The inner dilator and wire were removed, and an 035 wire was advanced under fluoroscopy into the abdominal aorta. The sheath was removed and a 25cm 38F straight vascular sheath was placed. The dilator was removed and the sheath was flushed. Sheath was attached to  pressurized and heparinized saline bag for constant forward flow. A coaxial system was then advanced over the 035 wire. This included a 95cm 087 "Walrus" balloon guide with coaxial 125cm Berenstein diagnostic  catheter. This was advanced to the proximal descending thoracic aorta. Wire was then removed. Double flush of the catheter was performed. Catheter was then used to select the left common carotid artery. Angiogram was performed. The catheter and balloon guide combination was advanced over a standard glide wire into left cervical ICA, with distal position achieved of the balloon guide. The diagnostic catheter and the wire were removed. Formal angiogram was performed. Road map function was used once the occluded vessel was identified. Copious back flush was performed and the balloon catheter was attached to heparinized and pressurized saline bag for forward flow. A second coaxial system was then advanced through the balloon catheter, which included the selected intermediate catheter, microcatheter, and microwire. In this scenario, the set up included a 71 zoom catheter, a Trevo Provue18 microcatheter, and 014 synchro soft wire. This system was advanced through the balloon guide catheter under the road-map function, with adequate back-flush at the rotating hemostatic valve at that back end of the balloon guide. Microcatheter and the intermediate catheter system were advanced through the siphon to the level of the occlusion. The microwire was passed through the occlusion into the proximal MCA. Aspiration catheter was then advanced through the ophthalmic segment. Microcatheter and the microwire were removed. The aspiration catheter was attached to the proprietary engine for aspiration confirming adequate backflow. Catheter was then advanced to the site of the occlusion, confirming stasis of backflow. 1 minutes time frame was observed. The catheter was then withdrawn into the ophthalmic segment under fluoroscopy. No  significant flow was observed. The balloon on the balloon guide was gently inflated under fluoroscopy. Aspiration catheter was removed from the system while aspiration was performed at the balloon guide hub. The aspiration catheter was completely removed, adequate blood return was confirmed through the balloon guide, and then the balloon on the balloon guide was deflated. Repeat control angiogram was performed confirming continued occlusion at the ICA terminus. We elected to perform a 2nd pass. Coaxial system was again advanced through the balloon catheter, which included the selected intermediate catheter, microcatheter, and microwire. In this scenario, the set up included a CAT 6, a Trevo Provue18 microcatheter, and 014 synchro soft wire. This system was advanced through the balloon guide catheter under the road-map function, with adequate back-flush at the rotating hemostatic valve at that back end of the balloon guide. Blood was then aspirated through the hub of the microcatheter, and a gentle contrast injection was performed confirming intraluminal position. A rotating hemostatic valve was then attached to the back end of the microcatheter, and a pressurized and heparinized saline bag was attached to the catheter. 4 x 40 solitaire device was then selected. Back flush was achieved at the rotating hemostatic valve, and then the device was gently advanced through the microcatheter to the distal end. The retriever was then unsheathed by withdrawing the microcatheter under fluoroscopy. Once the retriever was completely unsheathed, the microcatheter was carefully stripped from the delivery device. Control angiogram was performed from the intermediate catheter. A 3 minute time interval was observed. The balloon at the balloon guide catheter was then inflated under fluoroscopy for proximal flow arrest. Constant aspiration using the proprietary engine was then performed at the intermediate catheter, as the retriever was  gently and slowly withdrawn with fluoroscopic observation. Once the retriever was "corked" within the tip of the intermediate catheter, both were removed from the system. Free aspiration was confirmed at the hub of the balloon guide catheter, with free blood return confirmed. The balloon was  then deflated, and a control angiogram was performed. Restoration of flow was confirmed. Angiogram of the cervical ICA was performed. Balloon guide was withdrawn into the carotid artery and repeat angiogram was performed. Balloon guide was then removed. The skin at the puncture site was then cleaned with Chlorhexidine. The 8 French sheath was removed and an 32F angioseal was deployed. Flat panel CT was performed. Patient was extubated once the CT was reviewed. Patient tolerated the procedure well and remained hemodynamically stable throughout. No complications were encountered and no significant blood loss encountered. IMPRESSION: Status post ultrasound guided access right common femoral artery for left-sided cervical/cerebral angiogram and mechanical thrombectomy of ICA terminus occlusion restoring complete flow (TICI 3) into the ACA territory and MCA territory of the left hemisphere. Angio-Seal deployed for hemostasis. Signed, Dulcy Fanny. Dellia Nims, RPVI [facility] Radiology PLAN: The patient will remain intubated ICU status Target systolic blood pressure of 120-140 Right hip straight time 6 hours Frequent neurovascular checks Repeat neurologic imaging with CT and/MRI at the discretion of neurology team Electronically Signed   By: Corrie Mckusick D.O.   On: 12/27/2019 02:46   IR US Guide Vasc Access Right  Result Date: 12/27/2019 INDICATION: 54 year old female with a history of acute left MCA stroke, ELVO, presenting for mechanical thrombectomy EXAM: ULTRASOUND GUIDED ACCESS RIGHT COMMON FEMORAL ARTERY CERVICAL AND CEREBRAL ANGIOGRAM MECHANICAL THROMBECTOMY LEFT ICA ANGIO-SEAL  FOR HEMOSTASIS COMPARISON:  CT IMAGING SAME DAY MEDICATIONS: NONE ANESTHESIA/SEDATION: The anesthesia team was present to provide general endotracheal tube anesthesia and for patient monitoring during the procedure. Intubation was performed in Neuro biplane room. Radial arterial line was performed by the anesthesia team. interventional neuro radiology nursing staff was also present. CONTRAST:  50 cc Omnipaque 300 FLUOROSCOPY TIME:  Fluoroscopy Time: 8 minutes 48 seconds (901 mGy). COMPLICATIONS: None TECHNIQUE: Informed written consent was obtained from the patient's family after a thorough discussion of the procedural risks, benefits and alternatives. Specific risks discussed include: Bleeding, infection, contrast reaction, kidney injury/failure, need for further procedure/surgery, arterial injury or dissection, embolization to new territory, intracranial hemorrhage (10-15% risk), neurologic deterioration, cardiopulmonary collapse, death. All questions were addressed. Maximal Sterile Barrier Technique was utilized including during the procedure including caps, mask, sterile gowns, sterile gloves, sterile drape, hand hygiene and skin antiseptic. A timeout was performed prior to the initiation of the procedure. The anesthesia team was present to provide general endotracheal tube anesthesia and for patient monitoring during the procedure. Interventional neuro radiology nursing staff was also present. FINDINGS: Initial Findings: Left internal carotid artery: Initial angiogram demonstrates ICA terminus occlusion, without significant plaque of the petrous segment, last seroma segment, cavernous segment or the proximal supraclinoid segment. Ophthalmic artery is patent, with choroid blush. Patent posterior communicating artery with flow into the left PCA. Left MCA: Initial imaging demonstrates no significant primary filling of the MCA. There are leptomeningeal collateral flows partially filling the superior division of the  MCA territory. Anterior choroidal artery remains patent with some anterior-posterior flow through the choroidal system into the parietal territory. Left ACA: Initial imaging demonstrates no significant primary filling of the ACA territory. Completion Findings: Left MCA: After 2 passes there is complete restoration of flow through the ICA terminus, incomplete restoration of flow through the right MCA branches. Left ACA: There is restoration of flow into the A1 segment. The right ACA territory is partially served by left-sided ACA branches in the pericallosal distribution. TICI 3 flow restored Flat panel CT demonstrates no subarachnoid hemorrhage or  significant contrast staining. PROCEDURE: The anesthesia team was present to provide general endotracheal tube anesthesia and for patient monitoring during the procedure. Intubation was performed in negative pressure [place] in neuro IR holding. Interventional neuro radiology nursing staff was also present. Ultrasound survey of the right inguinal region was performed with images stored and sent to PACs. 11 blade scalpel was used to make a small incision. Blunt dissection was performed with US guidance. A micropuncture needle was used access the right common femoral artery under ultrasound. With excellent arterial blood flow returned, an .018 micro wire was passed through the needle, observed to enter the abdominal aorta under fluoroscopy. The needle was removed, and a micropuncture sheath was placed over the wire. The inner dilator and wire were removed, and an 035 wire was advanced under fluoroscopy into the abdominal aorta. The sheath was removed and a 25cm 27F straight vascular sheath was placed. The dilator was removed and the sheath was flushed. Sheath was attached to pressurized and heparinized saline bag for constant forward flow. A coaxial system was then advanced over the 035 wire. This included a 95cm 087 "Walrus" balloon guide with coaxial 125cm Berenstein diagnostic  catheter. This was advanced to the proximal descending thoracic aorta. Wire was then removed. Double flush of the catheter was performed. Catheter was then used to select the left common carotid artery. Angiogram was performed. The catheter and balloon guide combination was advanced over a standard glide wire into left cervical ICA, with distal position achieved of the balloon guide. The diagnostic catheter and the wire were removed. Formal angiogram was performed. Road map function was used once the occluded vessel was identified. Copious back flush was performed and the balloon catheter was attached to heparinized and pressurized saline bag for forward flow. A second coaxial system was then advanced through the balloon catheter, which included the selected intermediate catheter, microcatheter, and microwire. In this scenario, the set up included a 71 zoom catheter, a Trevo Provue18 microcatheter, and 014 synchro soft wire. This system was advanced through the balloon guide catheter under the road-map function, with adequate back-flush at the rotating hemostatic valve at that back end of the balloon guide. Microcatheter and the intermediate catheter system were advanced through the siphon to the level of the occlusion. The microwire was passed through the occlusion into the proximal MCA. Aspiration catheter was then advanced through the ophthalmic segment. Microcatheter and the microwire were removed. The aspiration catheter was attached to the proprietary engine for aspiration confirming adequate backflow. Catheter was then advanced to the site of the occlusion, confirming stasis of backflow. 1 minutes time frame was observed. The catheter was then withdrawn into the ophthalmic segment under fluoroscopy. No significant flow was observed. The balloon on the balloon guide was gently inflated under fluoroscopy. Aspiration catheter was removed from the system while aspiration was performed at the balloon guide hub.  The aspiration catheter was completely removed, adequate blood return was confirmed through the balloon guide, and then the balloon on the balloon guide was deflated. Repeat control angiogram was performed confirming continued occlusion at the ICA terminus. We elected to perform a 2nd pass. Coaxial system was again advanced through the balloon catheter, which included the selected intermediate catheter, microcatheter, and microwire. In this scenario, the set up included a CAT 6, a Trevo Provue18 microcatheter, and 014 synchro soft wire. This system was advanced through the balloon guide catheter under the road-map function, with adequate back-flush at the rotating hemostatic valve at that back end  of the balloon guide. Blood was then aspirated through the hub of the microcatheter, and a gentle contrast injection was performed confirming intraluminal position. A rotating hemostatic valve was then attached to the back end of the microcatheter, and a pressurized and heparinized saline bag was attached to the catheter. 4 x 40 solitaire device was then selected. Back flush was achieved at the rotating hemostatic valve, and then the device was gently advanced through the microcatheter to the distal end. The retriever was then unsheathed by withdrawing the microcatheter under fluoroscopy. Once the retriever was completely unsheathed, the microcatheter was carefully stripped from the delivery device. Control angiogram was performed from the intermediate catheter. A 3 minute time interval was observed. The balloon at the balloon guide catheter was then inflated under fluoroscopy for proximal flow arrest. Constant aspiration using the proprietary engine was then performed at the intermediate catheter, as the retriever was gently and slowly withdrawn with fluoroscopic observation. Once the retriever was "corked" within the tip of the intermediate catheter, both were removed from the system. Free aspiration was confirmed at the  hub of the balloon guide catheter, with free blood return confirmed. The balloon was then deflated, and a control angiogram was performed. Restoration of flow was confirmed. Angiogram of the cervical ICA was performed. Balloon guide was withdrawn into the carotid artery and repeat angiogram was performed. Balloon guide was then removed. The skin at the puncture site was then cleaned with Chlorhexidine. The 8 French sheath was removed and an 45F angioseal was deployed. Flat panel CT was performed. Patient was extubated once the CT was reviewed. Patient tolerated the procedure well and remained hemodynamically stable throughout. No complications were encountered and no significant blood loss encountered. IMPRESSION: Status post ultrasound guided access right common femoral artery for left-sided cervical/cerebral angiogram and mechanical thrombectomy of ICA terminus occlusion restoring complete flow (TICI 3) into the ACA territory and MCA territory of the left hemisphere. Angio-Seal deployed for hemostasis. Signed, Dulcy Fanny. Dellia Nims, RPVI [facility] Radiology PLAN: The patient will remain intubated ICU status Target systolic blood pressure of 120-140 Right hip straight time 6 hours Frequent neurovascular checks Repeat neurologic imaging with CT and/MRI at the discretion of neurology team Electronically Signed   By: Corrie Mckusick D.O.   On: 12/27/2019 02:46   CT CEREBRAL PERFUSION W CONTRAST  Result Date: 12/27/2019 CLINICAL DATA:  Initial evaluation for acute stroke, unresponsiveness. EXAM: CT ANGIOGRAPHY HEAD AND NECK CT PERFUSION BRAIN TECHNIQUE: Multidetector CT imaging of the head and neck was performed using the standard protocol during bolus administration of intravenous contrast. Multiplanar CT image reconstructions and MIPs were obtained to evaluate the vascular anatomy. Carotid stenosis measurements (when applicable) are obtained utilizing NASCET criteria,  using the distal internal carotid diameter as the denominator. Multiphase CT imaging of the brain was performed following IV bolus contrast injection. Subsequent parametric perfusion maps were calculated using RAPID software. CONTRAST:  128mL OMNIPAQUE IOHEXOL 350 MG/ML SOLN COMPARISON:  Prior head CT from earlier the same day. FINDINGS: CTA NECK FINDINGS Aortic arch: Visualized aortic arch of normal caliber with normal branch pattern. Mild atheromatous change about the arch and origin of the great vessels without stenosis. Visualized subclavian arteries widely patent. Right carotid system: Right common and internal carotid arteries widely patent without stenosis, dissection or occlusion. Left carotid system: Left common carotid artery patent from its origin to the bifurcation without stenosis. Mild atheromatous change about the left bifurcation without significant stenosis. Left ICA  widely patent distally to the skull base without stenosis, dissection or occlusion. Vertebral arteries: Both vertebral arteries arise from the subclavian arteries. Right vertebral artery dominant, with a diffusely hypoplastic left vertebral artery. Multifocal moderate to severe segmental stenoses seen involving the hypoplastic left vertebral artery, most pronounced proximally. On the right, there is a single short-segment severe stenosis involving the right V1 segment (series 4, image 272). Otherwise, right vertebral artery patent within the neck without stenosis or other abnormality. Skeleton: No acute osseous finding. Focal sclerotic lesion within the T2 vertebral body noted, nonspecific, but likely benign. No other worrisome lytic or blastic osseous lesions. Other neck: No other acute soft tissue abnormality within the neck. No mass lesion or adenopathy. 7 mm left thyroid nodule noted, felt to be of doubtful significance given size and patient age. No follow-up imaging recommended regarding this lesion. Upper chest: Visualized upper  chest demonstrates no acute finding. Partially visualized lungs are largely clear. Review of the MIP images confirms the above findings CTA HEAD FINDINGS Anterior circulation: On the right, the petrous, cavernous, and supraclinoid segments are widely patent to the terminus. 5 x 6 x 7 mm saccular aneurysm seen arising from the para clinoid right ICA (series 4, image 116). This is directed medially and posteriorly. On the left, patent but somewhat attenuated flow within the petrous and cavernous left ICA, with subsequent occlusion at the supraclinoid segment/left ICA terminus (series 4, image 115). A1 segments are opacified bilaterally, with filling of the left A1 via collateralization across the circle-of-Willis. A2 segments well perfused to their distal aspects. Opacification of the mid and distal left M1 segment is seen, which could be related to subocclusive thrombus and/or collateralization. Normal left MCA bifurcation. Attenuated flow seen within the left MCA branches distally, likely via collateralization. Right M1 widely patent. Normal right MCA bifurcation. Distal right MCA branches well perfused. Small vessel atheromatous irregularity noted. Posterior circulation: Vertebral arteries patent to the vertebrobasilar junction without stenosis. Both picas patent. Basilar diminutive but patent to its distal aspect without stenosis. Superior cerebral arteries patent at their origins. Both PCAs primarily supplied via the basilar well perfused to their distal aspects. Venous sinuses: Grossly patent allowing for timing of the contrast bolus. Anatomic variants: None significant. Review of the MIP images confirms the above findings CT Brain Perfusion Findings: ASPECTS: 6 CBF (<30%) Volume: 70mL Perfusion (Tmax>6.0s) volume: 22mL Mismatch Volume: 272mL Infarction Location:Patchy core infarct seen involving the deep and subcortical white matter of the anterior left frontal lobe. Large surrounding ischemic penumbra  throughout the majority of the left MCA distribution. IMPRESSION: CTA HEAD AND NECK IMPRESSION: 1. Positive CTA with occlusive thrombus at the left ICA terminus. Distal reconstitution at the mid and distal left M1 segment with attenuated flow distally within the left MCA branches, which could be related to subocclusive thrombus and/or collateralization. 2. Mild atheromatous change about the left carotid bifurcation without stenosis. No other hemodynamically significant stenosis involving the left carotid system. 3. Multifocal moderate to severe stenoses involving the hypoplastic left vertebral artery, most notable proximally. Single short-segment severe proximal right V1 stenosis. Dominant right vertebral artery otherwise widely patent. 4. 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 CT PERFUSION IMPRESSION: 1. 24 cc acute core infarct involving the deep and subcortical white matter of the anterior left frontal region. Two hundred fourteen cc surrounding ischemic penumbra involving essentially the entirety of the left MCA distribution. 2. Aspects = 6 on prior head CT. Critical Value/emergent results  were called by telephone at the time of interpretation on 12/26/2019 at 11:48 pm to provider Dr. Tomi Bamberger, Who verbally acknowledged these results. Critical Value/emergent results were called by telephone at the time of interpretation on 12/26/2019 at 11:54 pm to provider Dr. Cheral Marker, Who verbally acknowledged these results. Electronically Signed   By: Jeannine Boga M.D.   On: 12/27/2019 00:41   DG Chest Port 1 View  Result Date: 12/27/2019 CLINICAL DATA:  Endotracheal tube placement EXAM: PORTABLE CHEST 1 VIEW COMPARISON:  Dec 26, 2019 FINDINGS: The endotracheal tube terminates above the carina by approximately 2.9 cm. The enteric tube extends below the left hemidiaphragm. There is postoperative atelectasis at the lung bases. There is no pneumothorax. The heart size is stable but enlarged. There  is no acute osseous abnormality. IMPRESSION: 1. Lines and tubes as above. 2. Postoperative atelectasis at the lung bases. 3. Cardiomegaly. Electronically Signed   By: Constance Holster M.D.   On: 12/27/2019 03:44   DG Chest Port 1 View  Result Date: 12/26/2019 CLINICAL DATA:  Weakness, altered mental status, could stroke EXAM: PORTABLE CHEST 1 VIEW COMPARISON:  CTa 10/31/2019, radiograph 10/31/2019 FINDINGS: Low volumes and central vascular crowding as well as slight prominence of the central pulmonary arteries which may reflect pulmonary artery hypertension. Streaky opacities favor atelectasis in the setting. No focal consolidative process. No pneumothorax or effusion. Cardiac silhouette is enlarged, similar to priors. Remaining cardiomediastinal contours are unremarkable. No acute osseous or soft tissue abnormality. Telemetry leads overlie the chest. IMPRESSION: Low lung volumes with streaky opacities favoring atelectasis. Electronically Signed   By: Lovena Le M.D.   On: 12/26/2019 23:13   ECHOCARDIOGRAM COMPLETE  Result Date: 12/27/2019    ECHOCARDIOGRAM REPORT   Patient Name:   [facility] A Carreker Date of Exam: 12/27/2019 Medical Rec #:  [id number]      Height:       66.0 in Accession #:    [id number]     Weight:       218.0 lb Date of Birth:  1966/01/26     BSA:          2.075 m Patient Age:    54 years       BP:           113/65 mmHg Patient Gender: F              HR:           60 bpm. Exam Location:  Inpatient Procedure: 2D Echo, Cardiac Doppler and Color Doppler Indications:    Stroke  History:        Patient has prior history of Echocardiogram examinations, most                 recent 05/11/2019. CHF, COPD, Arrythmias:Atrial Fibrillation;                 Risk Factors:Hypertension and Diabetes. Nicotine abuse,Substance                 abuse.  Sonographer:    Alvino Chapel RCS Referring Phys: [phone] ERIC LINDZEN  Sonographer Comments: Patient on mechanical ventilator during echo exam IMPRESSIONS  1. Left  ventricular ejection fraction, by estimation, is 30 to 35%. The left ventricle has moderately decreased function. The left ventricle demonstrates global hypokinesis. The left ventricular internal cavity size was mildly to moderately dilated. There is moderate asymmetric left ventricular hypertrophy. Left ventricular diastolic function could not be evaluated.  2. Right ventricular systolic function is  normal. The right ventricular size is normal.  3. Left atrial size was severely dilated.  4. Right atrial size was severely dilated.  5. The mitral valve is grossly normal. Trivial mitral valve regurgitation. No evidence of mitral stenosis.  6. The aortic valve is grossly normal. Aortic valve regurgitation is not visualized. Moderate aortic valve stenosis. FINDINGS  Left Ventricle: Left ventricular ejection fraction, by estimation, is 30 to 35%. The left ventricle has moderately decreased function. The left ventricle demonstrates global hypokinesis. The left ventricular internal cavity size was mildly to moderately  dilated. There is moderate asymmetric left ventricular hypertrophy. Left ventricular diastolic function could not be evaluated due to atrial fibrillation. Left ventricular diastolic function could not be evaluated. Right Ventricle: The right ventricular size is normal. No increase in right ventricular wall thickness. Right ventricular systolic function is normal. Left Atrium: Left atrial size was severely dilated. Right Atrium: Right atrial size was severely dilated. Pericardium: There is no evidence of pericardial effusion. Mitral Valve: The mitral valve is grossly normal. Trivial mitral valve regurgitation. No evidence of mitral valve stenosis. Tricuspid Valve: The tricuspid valve is normal in structure. Tricuspid valve regurgitation is trivial. No evidence of tricuspid stenosis. Aortic Valve: The aortic valve is grossly normal. Aortic valve regurgitation is not visualized. Moderate aortic stenosis is  present. Aortic valve mean gradient measures 12.0 mmHg. Aortic valve peak gradient measures 19.1 mmHg. Aortic valve area, by VTI measures 1.20 cm. Pulmonic Valve: The pulmonic valve was normal in structure. Pulmonic valve regurgitation is not visualized. No evidence of pulmonic stenosis. Aorta: The aortic root and ascending aorta are structurally normal, with no evidence of dilitation. IAS/Shunts: The atrial septum is grossly normal.  LEFT VENTRICLE PLAX 2D LVIDd:         4.20 cm LVIDs:         3.20 cm LV PW:         1.30 cm LV IVS:        1.50 cm LVOT diam:     2.00 cm LV SV:         57 LV SV Index:   28 LVOT Area:     3.14 cm  LV Volumes (MOD) LV vol d, MOD A2C: 68.2 ml LV vol d, MOD A4C: 100.0 ml LV vol s, MOD A2C: 26.9 ml LV vol s, MOD A4C: 42.7 ml LV SV MOD A2C:     41.3 ml LV SV MOD A4C:     100.0 ml LV SV MOD BP:      50.8 ml RIGHT VENTRICLE RV S prime:     7.18 cm/s TAPSE (M-mode): 1.9 cm LEFT ATRIUM              Index LA diam:        3.50 cm  1.69 cm/m LA Vol (A2C):   113.0 ml 54.47 ml/m LA Vol (A4C):   133.0 ml 64.11 ml/m LA Biplane Vol: 123.0 ml 59.29 ml/m  AORTIC VALVE AV Area (Vmax):    1.19 cm AV Area (Vmean):   1.27 cm AV Area (VTI):     1.20 cm AV Vmax:           218.50 cm/s AV Vmean:          158.000 cm/s AV VTI:            0.481 m AV Peak Grad:      19.1 mmHg AV Mean Grad:      12.0 mmHg LVOT Vmax:  82.50 cm/s LVOT Vmean:        64.100 cm/s LVOT VTI:          0.183 m LVOT/AV VTI ratio: 0.38  AORTA Ao Root diam: 3.20 cm MITRAL VALVE MV Area (PHT): 3.53 cm     SHUNTS MV Decel Time: 215 msec     Systemic VTI:  0.18 m MV E velocity: 133.00 cm/s  Systemic Diam: 2.00 cm Mertie Moores MD Electronically signed by Mertie Moores MD Signature Date/Time: 12/27/2019/2:34:44 PM    Final    IR PERCUTANEOUS ART THROMBECTOMY/INFUSION INTRACRANIAL INC DIAG ANGIO  Result Date: 12/27/2019 INDICATION: 54 year old female with a history of acute left MCA stroke, ELVO, presenting for mechanical  thrombectomy EXAM: ULTRASOUND GUIDED ACCESS RIGHT COMMON FEMORAL ARTERY CERVICAL AND CEREBRAL ANGIOGRAM MECHANICAL THROMBECTOMY LEFT ICA ANGIO-SEAL FOR HEMOSTASIS COMPARISON:  CT IMAGING SAME DAY MEDICATIONS: NONE ANESTHESIA/SEDATION: The anesthesia team was present to provide general endotracheal tube anesthesia and for patient monitoring during the procedure. Intubation was performed in Neuro biplane room. Radial arterial line was performed by the anesthesia team. interventional neuro radiology nursing staff was also present. CONTRAST:  50 cc Omnipaque 300 FLUOROSCOPY TIME:  Fluoroscopy Time: 8 minutes 48 seconds (901 mGy). COMPLICATIONS: None TECHNIQUE: Informed written consent was obtained from the patient's family after a thorough discussion of the procedural risks, benefits and alternatives. Specific risks discussed include: Bleeding, infection, contrast reaction, kidney injury/failure, need for further procedure/surgery, arterial injury or dissection, embolization to new territory, intracranial hemorrhage (10-15% risk), neurologic deterioration, cardiopulmonary collapse, death. All questions were addressed. Maximal Sterile Barrier Technique was utilized including during the procedure including caps, mask, sterile gowns, sterile gloves, sterile drape, hand hygiene and skin antiseptic. A timeout was performed prior to the initiation of the procedure. The anesthesia team was present to provide general endotracheal tube anesthesia and for patient monitoring during the procedure. Interventional neuro radiology nursing staff was also present. FINDINGS: Initial Findings: Left internal carotid artery: Initial angiogram demonstrates ICA terminus occlusion, without significant plaque of the petrous segment, last seroma segment, cavernous segment or the proximal supraclinoid segment. Ophthalmic artery is patent, with choroid blush. Patent posterior communicating artery with flow into the left PCA. Left MCA: Initial  imaging demonstrates no significant primary filling of the MCA. There are leptomeningeal collateral flows partially filling the superior division of the MCA territory. Anterior choroidal artery remains patent with some anterior-posterior flow through the choroidal system into the parietal territory. Left ACA: Initial imaging demonstrates no significant primary filling of the ACA territory. Completion Findings: Left MCA: After 2 passes there is complete restoration of flow through the ICA terminus, incomplete restoration of flow through the right MCA branches. Left ACA: There is restoration of flow into the A1 segment. The right ACA territory is partially served by left-sided ACA branches in the pericallosal distribution. TICI 3 flow restored Flat panel CT demonstrates no subarachnoid hemorrhage or significant contrast staining. PROCEDURE: The anesthesia team was present to provide general endotracheal tube anesthesia and for patient monitoring during the procedure. Intubation was performed in negative pressure [place] in neuro IR holding. Interventional neuro radiology nursing staff was also present. Ultrasound survey of the right inguinal region was performed with images stored and sent to PACs. 11 blade scalpel was used to make a small incision. Blunt dissection was performed with US guidance. A micropuncture needle was used access the right common femoral artery under ultrasound. With excellent arterial blood flow returned, an .018 micro wire was passed through the needle, observed to  enter the abdominal aorta under fluoroscopy. The needle was removed, and a micropuncture sheath was placed over the wire. The inner dilator and wire were removed, and an 035 wire was advanced under fluoroscopy into the abdominal aorta. The sheath was removed and a 25cm 61F straight vascular sheath was placed. The dilator was removed and the sheath was flushed. Sheath was attached to pressurized and heparinized saline bag for constant  forward flow. A coaxial system was then advanced over the 035 wire. This included a 95cm 087 "Walrus" balloon guide with coaxial 125cm Berenstein diagnostic catheter. This was advanced to the proximal descending thoracic aorta. Wire was then removed. Double flush of the catheter was performed. Catheter was then used to select the left common carotid artery. Angiogram was performed. The catheter and balloon guide combination was advanced over a standard glide wire into left cervical ICA, with distal position achieved of the balloon guide. The diagnostic catheter and the wire were removed. Formal angiogram was performed. Road map function was used once the occluded vessel was identified. Copious back flush was performed and the balloon catheter was attached to heparinized and pressurized saline bag for forward flow. A second coaxial system was then advanced through the balloon catheter, which included the selected intermediate catheter, microcatheter, and microwire. In this scenario, the set up included a 71 zoom catheter, a Trevo Provue18 microcatheter, and 014 synchro soft wire. This system was advanced through the balloon guide catheter under the road-map function, with adequate back-flush at the rotating hemostatic valve at that back end of the balloon guide. Microcatheter and the intermediate catheter system were advanced through the siphon to the level of the occlusion. The microwire was passed through the occlusion into the proximal MCA. Aspiration catheter was then advanced through the ophthalmic segment. Microcatheter and the microwire were removed. The aspiration catheter was attached to the proprietary engine for aspiration confirming adequate backflow. Catheter was then advanced to the site of the occlusion, confirming stasis of backflow. 1 minutes time frame was observed. The catheter was then withdrawn into the ophthalmic segment under fluoroscopy. No significant flow was observed. The balloon on the  balloon guide was gently inflated under fluoroscopy. Aspiration catheter was removed from the system while aspiration was performed at the balloon guide hub. The aspiration catheter was completely removed, adequate blood return was confirmed through the balloon guide, and then the balloon on the balloon guide was deflated. Repeat control angiogram was performed confirming continued occlusion at the ICA terminus. We elected to perform a 2nd pass. Coaxial system was again advanced through the balloon catheter, which included the selected intermediate catheter, microcatheter, and microwire. In this scenario, the set up included a CAT 6, a Trevo Provue18 microcatheter, and 014 synchro soft wire. This system was advanced through the balloon guide catheter under the road-map function, with adequate back-flush at the rotating hemostatic valve at that back end of the balloon guide. Blood was then aspirated through the hub of the microcatheter, and a gentle contrast injection was performed confirming intraluminal position. A rotating hemostatic valve was then attached to the back end of the microcatheter, and a pressurized and heparinized saline bag was attached to the catheter. 4 x 40 solitaire device was then selected. Back flush was achieved at the rotating hemostatic valve, and then the device was gently advanced through the microcatheter to the distal end. The retriever was then unsheathed by withdrawing the microcatheter under fluoroscopy. Once the retriever was completely unsheathed, the microcatheter was carefully stripped from  the delivery device. Control angiogram was performed from the intermediate catheter. A 3 minute time interval was observed. The balloon at the balloon guide catheter was then inflated under fluoroscopy for proximal flow arrest. Constant aspiration using the proprietary engine was then performed at the intermediate catheter, as the retriever was gently and slowly withdrawn with fluoroscopic  observation. Once the retriever was "corked" within the tip of the intermediate catheter, both were removed from the system. Free aspiration was confirmed at the hub of the balloon guide catheter, with free blood return confirmed. The balloon was then deflated, and a control angiogram was performed. Restoration of flow was confirmed. Angiogram of the cervical ICA was performed. Balloon guide was withdrawn into the carotid artery and repeat angiogram was performed. Balloon guide was then removed. The skin at the puncture site was then cleaned with Chlorhexidine. The 8 French sheath was removed and an 34F angioseal was deployed. Flat panel CT was performed. Patient was extubated once the CT was reviewed. Patient tolerated the procedure well and remained hemodynamically stable throughout. No complications were encountered and no significant blood loss encountered. IMPRESSION: Status post ultrasound guided access right common femoral artery for left-sided cervical/cerebral angiogram and mechanical thrombectomy of ICA terminus occlusion restoring complete flow (TICI 3) into the ACA territory and MCA territory of the left hemisphere. Angio-Seal deployed for hemostasis. Signed, Dulcy Fanny. Dellia Nims, RPVI [facility] Radiology PLAN: The patient will remain intubated ICU status Target systolic blood pressure of 120-140 Right hip straight time 6 hours Frequent neurovascular checks Repeat neurologic imaging with CT and/MRI at the discretion of neurology team Electronically Signed   By: Corrie Mckusick D.O.   On: 12/27/2019 02:46   CT HEAD CODE STROKE WO CONTRAST  Result Date: 12/26/2019 CLINICAL DATA:  Code stroke. Initial evaluation for acute unresponsiveness. EXAM: CT HEAD WITHOUT CONTRAST TECHNIQUE: Contiguous axial images were obtained from the base of the skull through the vertex without intravenous contrast. COMPARISON:  CT prior head CT from 08/19/2016. FINDINGS: Brain:  Examination somewhat technically limited by motion artifact with streak artifact through the skull base. There is question of asymmetric hypodensity involving the left basal ganglia, with involvement of the left caudate and lentiform nuclei. Additionally, the left insula and adjacent left temporal lobe appear hypodense as well. No associated mass effect. No intracranial hemorrhage. No other evidence for acute large vessel territory infarct. No mass lesion or midline shift. No hydrocephalus or extra-axial fluid collection. Vascular: Question asymmetric hyperdensity at the left ICA terminus (series 2, image 11). No other hyperdense vessel. Skull: Scalp soft tissues within normal limits.  Calvarium intact. Sinuses/Orbits: Globes and orbital soft tissues demonstrate no acute finding. Paranasal sinuses are clear. No mastoid effusion. Other: None. ASPECTS [facility] Stroke Program Early CT Score) - Ganglionic level infarction (caudate, lentiform nuclei, internal capsule, insula, M1-M3 cortex): 3 - Supraganglionic infarction (M4-M6 cortex): 3 Total score (0-10 with 10 being normal): 6 IMPRESSION: 1. Question evolving hypodensity involving the left basal ganglia, left insula, and adjacent left temporal lobe, which could reflect an evolving left MCA territory infarct. Additionally, there is question of asymmetric hyperdensity at the left ICA terminus, which could reflect intraluminal thrombus/LVO. Finding somewhat difficult to be certain given at there is some motion and streak artifact on this exam. Further assessment with dedicated CTA of the head and neck recommended. 2. ASPECTS is 6. Critical Value/emergent results were called by telephone at the time of interpretation on 12/26/2019 at 11:13 pm to provider  JOSEPH ZAMMIT , who verbally acknowledged these results. Electronically Signed   By: Jeannine Boga M.D.   On: 12/26/2019 23:18    Labs:  CBC: Recent Labs    05/11/19 0530 10/31/19 0635 12/26/19 2254  12/27/19 0305  WBC 6.7 7.6 8.1 7.9  HGB 12.6 13.1 13.5 13.3  HCT 40.0 40.9 42.0 41.6  PLT 238 252 221 217    COAGS: Recent Labs    12/26/19 2255  INR 1.2  APTT 23*    BMP: Recent Labs    10/31/19 0635 12/26/19 2254 12/27/19 0305 12/28/19 0443  NA 137 136 136 141  K 4.2 3.9 4.9 4.0  CL 101 104 106 107  CO2 26 21* 20* 25  GLUCOSE 338* 355* 345* 160*  BUN 21* 15 13 18   CALCIUM 9.4 8.7* 8.3* 8.5*  CREATININE 1.19* 1.15* 0.95 1.07*  GFRNONAA 52* 54* >60 59*  GFRAA >60 >60 >60 >60    LIVER FUNCTION TESTS: Recent Labs    04/19/19 1235 05/10/19 0848 12/26/19 2254  BILITOT 0.8 0.8 0.5  AST 29 18 36  ALT 37 18 26  ALKPHOS 149* 117 87  PROT 6.6 6.9 7.0  ALBUMIN 3.4* 3.6 3.7    Assessment and Plan:  History of acute CVA s/p cerebral arteriogram with emergent mechanical thrombectomy of left ICA terminus occlusion achieving a TICI 3 revascularization via right femoral approach 12/27/2019 by Dr. Earleen Newport. Patient's condition stable- remains intubated without sedation, does not open eyes to voice/painful stimuli and does not follow simple commands, can spontaneously move left side, RLE withdraws from pain, no movements of RUE. Right groin incision stable, distal pulses (DPs) palpable bilaterally with . Further plans per neurology/CCM- appreciate and agree with management. Please call NIR with questions/concerns.   Electronically Signed: Earley Abide, PA-C 12/28/2019, 9:34 AM   I spent a total of 25 Minutes at the the patient's bedside AND on the patient's hospital floor or unit, greater than 50% of which was counseling/coordinating care for CVA s/p revascularization.

## 2019-12-28 NOTE — Progress Notes (Signed)
NAME:  Robin [name], MRN:  [id number], DOB:  [birth year], LOS: 1 ADMISSION DATE:  12/26/2019, CONSULTATION DATE:  12/28/19 REFERRING MD:  Cheral Marker, CHIEF COMPLAINT:  Acute CVA   Brief History   54 year old female admitted with acute MCA CVA and left ICA LVO, taken emergently for thrombectomy and left intubated post procedure.  PCCM consulted for vent management.  History of present illness   54 year old female with a history of diabetes pretension, A. fib on Eliquis, heart failure, substance abuse who presented after a fall with right-sided weakness as a code stroke.  Found to have left MCA CVA and left ICA occlusion.  Taken emergently for thrombectomy and transferred to the ICU intubated.  PCCM consulted for vent management  Past Medical History   has a past medical history of Allergy, Anemia, Arthritis, CHF (congestive heart failure) ([place]), Chronic abdominal pain, Cocaine abuse ([place]), COPD (chronic obstructive pulmonary disease) ([place]), Essential hypertension, benign, Fatty liver, GERD (gastroesophageal reflux disease), Gout (2016), Normal coronary arteries, Ovarian cyst, and Type 2 diabetes mellitus ([place]).   Significant Hospital Events   5/24 Admit to neurology  Consults:  PCCM  Procedures:  5/24 ETT >  Significant Diagnostic Tests:  5/23 CT head >evolving hypodensity involving the left basal ganglia, left insula, and adjacent left temporal lobe, which could reflect an evolving left MCA territory infarct. Additionally, there is question of asymmetric hyperdensity at the left ICA terminus, which could reflect intraluminal thrombus/LVO MRI 5/24 > Evolving left MCA territory infarct with hemorrhagic transformation. Mass effect with 6 mm rightward midline shift. 7 mm paraclinoid right ICA aneurysm. Echo 5/24 > LVEF 35-45%. LV mild dilation. Global HK. Mod asymmetric LV hypertrophy. Both atria severely dilated.  CT head 5/24 > Acute left MCA territory infarct with petechial hemorrhage. No  evident progression from brain MRI yesterday. Midline shift measures 7 mm.  Micro Data:  5/24 Sars-Cov-2>>negative 5/24 UC>>  Antimicrobials:    Interim history/subjective:  Doing a little better this morning   Objective   Blood pressure 109/84, pulse (!) 103, temperature 98.4 F (36.9 C), temperature source Oral, resp. rate 13, height 5\' 6"  (1.676 m), weight 100.8 kg, last menstrual period 08/21/2011, SpO2 100 %.    Vent Mode: PSV;CPAP FiO2 (%):  [40 %] 40 % Set Rate:  [16 bmp] 16 bmp Vt Set:  [470 mL] 470 mL PEEP:  [5 cmH20] 5 cmH20 Pressure Support:  [10 cmH20] 10 cmH20 Plateau Pressure:  [14 cmH20-20 cmH20] 14 cmH20   Intake/Output Summary (Last 24 hours) at 12/28/2019 0845 Last data filed at 12/28/2019 0800 Gross per 24 hour  Intake 2243.16 ml  Output 2125 ml  Net 118.16 ml   Filed Weights   12/26/19 2243 12/28/19 0500  Weight: 98.9 kg 100.8 kg    General: obese female on vent HEENT: MM pink/moist, ETT Neuro: Opens eyes to touch. Moves L to command. No movement on R.  CV: IRIR no mrg. Borderline tahcy PULM: Clear, no wheeze GI: Soft, NT, ND Extremities: No acute deformity or edema Skin: no rashes or lesions   Resolved Hospital Problem list     Assessment & Plan:   Acute left MCA ischemic CVA with left ICA LVO s/p thrombectomy Management per neurology, SBP goal 120-130 R ICA aneurysm  - Per stroke service.  - Hypertonic saline 57mL/Hr  Acute hypoxemic respirtory failure: in the post operative setting requiring sedation.  COPD without acute exacerbation.  - Full vent support - Weaning well 5/5 - Not a candidate for  extubation today - Duonebs - VAP bundle  Atrial fibrillation HTN Chronic HFrEF: LVEF 35% - Eliquis holding, may need different agent as she has failed eliquis at this point, assuming she is medically compliant with it.  - SQ heparin start today. Wait until tomorrow to start systemic AC per stroke.  - Continue home coreg, digoxin,  torsemide  Type 2 diabetes - Holding metformin - CBG monitoring and SSI. Increase scale.  - Add glargine 10 units   Nutrition - Tube feeds  Best practice:  Diet: N.p.o. Pain/Anxiety/Delirium protocol (if indicated): Propofol, fentanyl VAP protocol (if indicated): HOB 30 degrees, suction as needed DVT prophylaxis: SCDs GI prophylaxis: Protonix Glucose control: SSI Mobility: Bedrest Code Status: Full code Family Communication: updated bedside Disposition: ICU  Labs   CBC: Recent Labs  Lab 12/26/19 2254 12/27/19 0305  WBC 8.1 7.9  NEUTROABS 5.1 6.1  HGB 13.5 13.3  HCT 42.0 41.6  MCV 91.7 91.6  PLT 221 [id number]    Basic Metabolic Panel: Recent Labs  Lab 12/26/19 2254 12/27/19 0305 12/27/19 1026 12/27/19 1700 12/28/19 0443  NA 136 136  --   --  141  K 3.9 4.9  --   --  4.0  CL 104 106  --   --  107  CO2 21* 20*  --   --  25  GLUCOSE 355* 345*  --   --  160*  BUN 15 13  --   --  18  CREATININE 1.15* 0.95  --   --  1.07*  CALCIUM 8.7* 8.3*  --   --  8.5*  MG  --   --  2.0 2.0 2.2  PHOS  --   --  3.3 3.4 3.3   GFR: Estimated Creatinine Clearance: 72.9 mL/min (A) (by C-G formula based on SCr of 1.07 mg/dL (H)). Recent Labs  Lab 12/26/19 2254 12/27/19 0305  WBC 8.1 7.9    Liver Function Tests: Recent Labs  Lab 12/26/19 2254  AST 36  ALT 26  ALKPHOS 87  BILITOT 0.5  PROT 7.0  ALBUMIN 3.7   No results for input(s): LIPASE, AMYLASE in the last 168 hours. No results for input(s): AMMONIA in the last 168 hours.  ABG    Component Value Date/Time   PHART 7.519 (H) 07/27/2007 1302   PCO2ART 28.1 (L) 07/27/2007 1302   PO2ART 110.0 (H) 07/27/2007 1302   HCO3 22.8 07/27/2007 1302   TCO2 25 04/19/2017 1747   O2SAT 98.6 07/27/2007 1302     Coagulation Profile: Recent Labs  Lab 12/26/19 2255  INR 1.2    Cardiac Enzymes: No results for input(s): CKTOTAL, CKMB, CKMBINDEX, TROPONINI in the last 168 hours.  HbA1C: Hemoglobin A1C  Date/Time Value Ref  Range Status  09/13/2019 07:38 AM 10.3 (A) 4.0 - 5.6 % Final   Hgb A1c MFr Bld  Date/Time Value Ref Range Status  12/27/2019 03:05 AM 11.6 (H) 4.8 - 5.6 % Final    Comment:    (NOTE) Pre diabetes:          5.7%-6.4% Diabetes:              >6.4% Glycemic control for   <7.0% adults with diabetes   06/28/2019 10:00 AM 11.4 (H) 4.8 - 5.6 % Final    Comment:    (NOTE) Pre diabetes:          5.7%-6.4% Diabetes:              >6.4% Glycemic control for   <  7.0% adults with diabetes     CBG: Recent Labs  Lab 12/27/19 1959 12/27/19 2026 12/28/19 0020 12/28/19 0430 12/28/19 0802  GLUCAP 270* 275* 261* 132* 191*    Critical care time 45 minutes required due to vent dependent respiratory failure. Large CVA with cerebral edema.   Georgann Housekeeper, AGACNP-BC [place]  See Amion for personal pager PCCM on call pager [phone]  12/28/2019 9:11 AM

## 2019-12-28 NOTE — Progress Notes (Addendum)
STROKE TEAM PROGRESS NOTE   INTERVAL HISTORY Patient remains intubated for respiratory failure on ventilatory support.  She is lightly sedated.  Repeat CT scan of the head from this morning shows evolving large left MCA infarct with now increasing 7 mm left-to-right shift.  There is petechial hemorrhagic transformation but no frank hematoma.  Blood pressure is adequately controlled.  Hypertonic saline drip has been started at 75 cc an hour this morning. Serum sodium is 141 this morning Vitals:   12/28/19 0800 12/28/19 0803 12/28/19 0825 12/28/19 0830  BP: (!) 110/97   109/84  Pulse: (!) 106 (!) 101  (!) 103  Resp: 16 17  13   Temp: 98.4 F (36.9 C)     TempSrc: Oral     SpO2: 100% 100% 100% 100%  Weight:      Height:        CBC:  Recent Labs  Lab 12/26/19 2254 12/27/19 0305  WBC 8.1 7.9  NEUTROABS 5.1 6.1  HGB 13.5 13.3  HCT 42.0 41.6  MCV 91.7 91.6  PLT 221 [id number]    Basic Metabolic Panel:  Recent Labs  Lab 12/27/19 0305 12/27/19 1026 12/27/19 1700 12/28/19 0443  NA 136  --   --  141  K 4.9  --   --  4.0  CL 106  --   --  107  CO2 20*  --   --  25  GLUCOSE 345*  --   --  160*  BUN 13  --   --  18  CREATININE 0.95  --   --  1.07*  CALCIUM 8.3*  --   --  8.5*  MG  --    < > 2.0 2.2  PHOS  --    < > 3.4 3.3   < > = values in this interval not displayed.   Lipid Panel:     Component Value Date/Time   CHOL 121 12/27/2019 0305   TRIG 128 12/27/2019 0305   HDL 27 (L) 12/27/2019 0305   CHOLHDL 4.5 12/27/2019 0305   VLDL 26 12/27/2019 0305   LDLCALC 68 12/27/2019 0305   HgbA1c:  Lab Results  Component Value Date   HGBA1C 11.6 (H) 12/27/2019   Urine Drug Screen:     Component Value Date/Time   LABOPIA NONE DETECTED 12/26/2019 2249   COCAINSCRNUR POSITIVE (A) 12/26/2019 2249   LABBENZ NONE DETECTED 12/26/2019 2249   AMPHETMU NONE DETECTED 12/26/2019 2249   THCU NONE DETECTED 12/26/2019 2249   LABBARB NONE DETECTED 12/26/2019 2249    Alcohol Level      Component Value Date/Time   ETH <10 12/26/2019 2255   IMAGING past 24 hours CT Head Wo Contrast  Result Date: 12/28/2019 CLINICAL DATA:  Stroke follow-up EXAM: CT HEAD WITHOUT CONTRAST TECHNIQUE: Contiguous axial images were obtained from the base of the skull through the vertex without intravenous contrast. COMPARISON:  Brain MRI from yesterday FINDINGS: Brain: Cytotoxic edema in the anterior left MCA division and basal ganglia. There is petechial hemorrhage better seen by prior MRI. No discrete hematoma. Swelling causes 7 mm of midline shift. No entrapment. Vascular: Dense appearance of the left MCA which may be related to adjacent edematous brain. Skull: Negative Sinuses/Orbits: Negative IMPRESSION: Acute left MCA territory infarct with petechial hemorrhage. No evident progression from brain MRI yesterday. Midline shift measures 7 mm. Electronically Signed   By: Monte Fantasia M.D.   On: 12/28/2019 04:29   MR BRAIN WO CONTRAST  Result Date: 12/27/2019 CLINICAL  DATA:  Stroke follow-up; status post left MCA thrombectomy EXAM: MRI HEAD WITHOUT CONTRAST TECHNIQUE: Multiplanar, multiecho pulse sequences of the brain and surrounding structures were obtained without intravenous contrast. COMPARISON:  Head CT Dec 26, 2019 FINDINGS: Brain: Large area of restricted diffusion involving the left basal ganglia, insula, anterior temporal lobe including amygdala, inferior frontal lobe with cortical restricted diffusion in the lateral frontal and parietal regions, consistent with evolving left MCA territory infarct. There is susceptibility artifact along the cortex and basal ganglia, consistent with hemorrhagic transformation. There is mass effect with effacement of the regional sulci and partial effacement of the left lateral ventricle with a 6 mm rightward midline shift. There is no hydrocephalus. Remainder of the brain parenchyma is maintained. Vascular: Normal flow voids noting a 3 mm left MCA bifurcation  aneurysm and a 7 mm paraclinoid right ICA aneurysm. Skull and upper cervical spine: Normal marrow signal. Sinuses/Orbits: Mild mucosal thickening of the ethmoid cells. The orbits are maintained. Other: Endotracheal tube noted. IMPRESSION: 1. Evolving left MCA territory infarct with hemorrhagic transformation. Mass effect with 6 mm rightward midline shift. 2. Normal flow voids noting a 3 mm left MCA bifurcation aneurysm and a 7 mm paraclinoid right ICA aneurysm. Electronically Signed   By: Pedro Earls M.D.   On: 12/27/2019 15:29   ECHOCARDIOGRAM COMPLETE  Result Date: 12/27/2019    ECHOCARDIOGRAM REPORT   Patient Name:   [facility] [place] Date of Exam: 12/27/2019 Medical Rec #:  [id number]      Height:       66.0 in Accession #:    [id number]     Weight:       218.0 lb Date of Birth:  [birth year]     BSA:          2.075 m Patient Age:    54 years       BP:           113/65 mmHg Patient Gender: F              HR:           60 bpm. Exam Location:  Inpatient Procedure: 2D Echo, Cardiac Doppler and Color Doppler Indications:    Stroke  History:        Patient has prior history of Echocardiogram examinations, most                 recent 05/11/2019. CHF, COPD, Arrythmias:Atrial Fibrillation;                 Risk Factors:Hypertension and Diabetes. Nicotine abuse,Substance                 abuse.  Sonographer:    Alvino Chapel RCS Referring Phys: [phone] ERIC LINDZEN  Sonographer Comments: Patient on mechanical ventilator during echo exam IMPRESSIONS  1. Left ventricular ejection fraction, by estimation, is 30 to 35%. The left ventricle has moderately decreased function. The left ventricle demonstrates global hypokinesis. The left ventricular internal cavity size was mildly to moderately dilated. There is moderate asymmetric left ventricular hypertrophy. Left ventricular diastolic function could not be evaluated.  2. Right ventricular systolic function is normal. The right ventricular size is normal.  3. Left  atrial size was severely dilated.  4. Right atrial size was severely dilated.  5. The mitral valve is grossly normal. Trivial mitral valve regurgitation. No evidence of mitral stenosis.  6. The aortic valve is grossly normal. Aortic valve regurgitation is not visualized. Moderate aortic valve  stenosis. FINDINGS  Left Ventricle: Left ventricular ejection fraction, by estimation, is 30 to 35%. The left ventricle has moderately decreased function. The left ventricle demonstrates global hypokinesis. The left ventricular internal cavity size was mildly to moderately  dilated. There is moderate asymmetric left ventricular hypertrophy. Left ventricular diastolic function could not be evaluated due to atrial fibrillation. Left ventricular diastolic function could not be evaluated. Right Ventricle: The right ventricular size is normal. No increase in right ventricular wall thickness. Right ventricular systolic function is normal. Left Atrium: Left atrial size was severely dilated. Right Atrium: Right atrial size was severely dilated. Pericardium: There is no evidence of pericardial effusion. Mitral Valve: The mitral valve is grossly normal. Trivial mitral valve regurgitation. No evidence of mitral valve stenosis. Tricuspid Valve: The tricuspid valve is normal in structure. Tricuspid valve regurgitation is trivial. No evidence of tricuspid stenosis. Aortic Valve: The aortic valve is grossly normal. Aortic valve regurgitation is not visualized. Moderate aortic stenosis is present. Aortic valve mean gradient measures 12.0 mmHg. Aortic valve peak gradient measures 19.1 mmHg. Aortic valve area, by VTI measures 1.20 cm. Pulmonic Valve: The pulmonic valve was normal in structure. Pulmonic valve regurgitation is not visualized. No evidence of pulmonic stenosis. Aorta: The aortic root and ascending aorta are structurally normal, with no evidence of dilitation. IAS/Shunts: The atrial septum is grossly normal.  LEFT VENTRICLE PLAX 2D  LVIDd:         4.20 cm LVIDs:         3.20 cm LV PW:         1.30 cm LV IVS:        1.50 cm LVOT diam:     2.00 cm LV SV:         57 LV SV Index:   28 LVOT Area:     3.14 cm  LV Volumes (MOD) LV vol d, MOD A2C: 68.2 ml LV vol d, MOD A4C: 100.0 ml LV vol s, MOD A2C: 26.9 ml LV vol s, MOD A4C: 42.7 ml LV SV MOD A2C:     41.3 ml LV SV MOD A4C:     100.0 ml LV SV MOD BP:      50.8 ml RIGHT VENTRICLE RV S prime:     7.18 cm/s TAPSE (M-mode): 1.9 cm LEFT ATRIUM              Index LA diam:        3.50 cm  1.69 cm/m LA Vol (A2C):   113.0 ml 54.47 ml/m LA Vol (A4C):   133.0 ml 64.11 ml/m LA Biplane Vol: 123.0 ml 59.29 ml/m  AORTIC VALVE AV Area (Vmax):    1.19 cm AV Area (Vmean):   1.27 cm AV Area (VTI):     1.20 cm AV Vmax:           218.50 cm/s AV Vmean:          158.000 cm/s AV VTI:            0.481 m AV Peak Grad:      19.1 mmHg AV Mean Grad:      12.0 mmHg LVOT Vmax:         82.50 cm/s LVOT Vmean:        64.100 cm/s LVOT VTI:          0.183 m LVOT/AV VTI ratio: 0.38  AORTA Ao Root diam: 3.20 cm MITRAL VALVE MV Area (PHT): 3.53 cm     SHUNTS MV Decel Time:  215 msec     Systemic VTI:  0.18 m MV E velocity: 133.00 cm/s  Systemic Diam: 2.00 cm Mertie Moores MD Electronically signed by Mertie Moores MD Signature Date/Time: 12/27/2019/2:34:44 PM    Final     PHYSICAL EXAM   Obese middle-aged African-American lady who is intubated and not sedated. . Afebrile. Head is nontraumatic. Neck is supple without bruit.    Cardiac exam no murmur or gallop. Lungs are clear to auscultation. Distal pulses are well felt. Neurological Exam :  Patient is drowsy with eyes closed.  She opens eyes minimally on auditory and tactile stimulation but is globally aphasic and does not follow any commands.  Slight left gaze preference.  Doll's eye movements are sluggish.  Pupils equal reactive.  Fundi could not be visualized.  Spontaneous left upper and lower extremity movements against gravity..  Right hemiplegia with right upper  extremity flaccidity and hypotonia with no withdrawal even to painful stimuli.  Trace withdrawal in the right lower extremity to painful stimuli.  Deep tendon flexes are depressed on the right and present on the left.  Right plantar equivocal left downgoing.  Gait not tested.  ASSESSMENT/PLAN Ms. Adriyana Alexis Voght is a 54 y.o. female with history of DM, HTN, atrial fibrillation on Eliquis, anemia, CHF, COPD and polysubstance abuse presenting to [facility] with R hemiparesis, unresponsive. Found to have L MCA infarct transferred to [facility] for thrombectomy .   Stroke:   L MCA infarct w/ petechial hemorrhage embolic in setting of cocaine use and cardiomyopathy  Code Stroke C T head evolving hypodensity L basal ganglia, L insula, L temporal lobe. ? Hyperdensity L ICA terminus. ASPECTS 6    CTA head & neck L ICA terminus occlusion w/ distal reconstitution mid and idstal L M1 w/ attenuated flow distally. Mild L ICA bifurcation atherosclerosis. Short severe proximal R V1 stenosis. 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   CT perfusion 24cc core infarct deep and subcortical white matter anterior L frontal lobe. 214cc penumbra.  Cerebral angio L ICA occlusion w/ no flow in ACA or MCA. TICI3 revascularization ACA and MCA. Earleen Newport)  Post IR CT no hemorrhage, no stain   MRI large patchy left MCA infarct with hemorrhagic petechial transformation.  Repeat CT 5/25 L MCA infarct w/ petechial hemorrhage, now w/ 73mm midline shift   2D Echo decreased ejection fraction 30 to 35%.  No definite clot.  LDL 68  HgbA1c 11.6  SCDs for VTE prophylaxis  Eliquis (apixaban) daily prior to admission, now on aspirin 81 mg daily. consider IV heparin as transition to oral anticoagulants once cerebral edema stabilized  Therapy recommendations:  pending   Disposition:  pending   Acute Respiratory Failure  Intubated for IR, left intubated for airway protection   CXR streaky  opacities favoring atx yest, today w/ post op atx  Continue vent support given increasing cerebral edema  CCM on board  Cytotoxic Cerebral Edema  69mm midline shift seen on CT 5/25 am  Started 3% hypertonic saline protocol @ 75h  D/c NS at 75/hr  Na 136-141-142   Goal Na 150-155  Check Na q6h  Repeat CT in am  Atrial Fibrillation  Home anticoagulation:  Eliquis (apixaban) daily   On Coreg 50 bid, digoxin 0.125 daily PTA  On diltiazem gtt - wean as able  Cardiomyopathy  2D Echo decreased ejection fraction 30 to 35%.  No definite clot.  Hypertension  Home meds:  Hydralazine 50 tid .  BP goal < 180 . On coreg 50 bid, dig 0.125, torsemide 20, prn labetalol . Long-term BP goal normotensive  Diabetes type II Uncontrolled  Home meds:  trulicity A999333 weekly, 0000000 30u bid, metformin 1000  HgbA1c 11.6, goal < 7.0  CBGs Recent Labs    12/28/19 0020 12/28/19 0430 12/28/19 0802  GLUCAP 261* 132* 191*      SSI increased  Holding metformin  On launtus 10  Dysphagia . Secondary to stroke . NPO . On TF  . Speech on board   Other Stroke Risk Factors  Former Cigarette smoker  ETOH use, alcohol level <10, will advise to drink no more than 1 drink(s) a day. Family aware  Polysubstance abuse, hx cocaine and marijuana- UDS:  THC NEGATIVE, Cocaine POSITIVE. Will advised to stop using due to stroke risk. Family aware.  Obesity, Body mass index is 35.87 kg/m., recommend weight loss, diet and exercise as appropriate   Congestive heart failure, on torsemide 20 bid ->decrease to daily given slight increase in Cre   Other Active Problems  Fatty liver  COPD  GERD  Hospital day # 1 Patient is developing increasing midline shift with cytotoxic edema and will start hypertonic saline through peripheral IV at 75 cc an hour with serum sodium goal 150-155.  We will hold off on extubation for next 1 to 2 days till edema and mental status improved.  Hold  anticoagulation for a few more days given large size of infarct but continue aspirin.  Discussed with patient's sister at the bedside and answered questions.  Discussed with Dr. Lynetta Mare critical care medicine.  This patient is critically ill and at significant risk of neurological worsening, death and care requires constant monitoring of vital signs, hemodynamics,respiratory and cardiac monitoring, extensive review of multiple databases, frequent neurological assessment, discussion with family, other specialists and medical decision making of high complexity.I have made any additions or clarifications directly to the above note.This critical care time does not reflect procedure time, or teaching time or supervisory time of PA/NP/Med Resident etc but could involve care discussion time.  I spent 35 minutes of neurocritical care time  in the care of  this patient.  Antony Contras, MD    To contact Stroke Continuity provider, please refer to [email or web address] After hours, contact General Neurology

## 2019-12-28 NOTE — Evaluation (Signed)
Physical Therapy Evaluation Patient Details Name: Robin [name] MRN: [id number] DOB: 06-Nov-1965 Today's Date: 12/28/2019   History of Present Illness  54 y.o. female with a history of DM, HTN, atrial fibrillation on Eliquis, anemia, CHF, COPD and polysubstance abuse, presenting initially to [facility] ED after being found unresponsive at home after family heard her fall. Pt transferred/admitted to [facility] for acute left MCA stroke and L ICA occlusion, is now s/p mechanical thrombectomy on 5/24, pt kept intubated post procedure.   Clinical Impression  Patient presents with mobility limited due to decreased balance, decreased strength with R hemiparesis, decreased arousal, decreased activity tolerance with agitation and diaphoresis in upright sitting position (vitals looked WNL), and decreased communication with aphasia.  She was likely living with family and independent prior though family stepped out during evaluation today.  Feel she will likely be a good candidate for CIR level rehab upon d/c.  PT to continue to follow acutely.     Follow Up Recommendations CIR;Supervision/Assistance - 24 hour    Equipment Recommendations  Hospital bed;Wheelchair (measurements PT);Wheelchair cushion (measurements PT);3in1 (PT)    Recommendations for Other Services Rehab consult     Precautions / Restrictions Precautions Precautions: Fall Precaution Comments: R hemi, weaning on vent Restrictions Weight Bearing Restrictions: No      Mobility  Bed Mobility Overal bed mobility: Needs Assistance Bed Mobility: Rolling;Sidelying to Sit;Sit to Supine Rolling: Max assist;+2 for physical assistance Sidelying to sit: Total assist;HOB elevated;+2 for physical assistance Supine to sit: Total assist;+2 for physical assistance Sit to supine: Total assist;+2 for physical assistance   General bed mobility comments: transitioning towards pt's R side. requires assist for LEs/trunk throughout transitions. Pt only able  to tolerate sitting EOB for short period of time, with increased secretions, diaphoresis and restless/appearing uncomfortable while upright  Transfers                 General transfer comment: deferred   Ambulation/Gait                Stairs            Wheelchair Mobility    Modified Rankin (Stroke Patients Only) Modified Rankin (Stroke Patients Only) Pre-Morbid Rankin Score: No symptoms Modified Rankin: Severe disability     Balance Overall balance assessment: Needs assistance Sitting-balance support: Feet supported Sitting balance-Leahy Scale: Zero Sitting balance - Comments: heavy reliance on external assist to maintain sitting balance; sat EOB maybe 2 minutes Postural control: Posterior lean                                   Pertinent Vitals/Pain Pain Assessment: Faces Faces Pain Scale: Hurts little more Pain Location: generalized discomfort with sitting upright Pain Descriptors / Indicators: Grimacing;Discomfort Pain Intervention(s): Monitored during session;Repositioned;Limited activity within patient's tolerance    Home Living Family/patient expects to be discharged to:: Private residence                 Additional Comments: per chart and RN report, sister has been present and supportive. Will need to confirm home setup/PLOF    Prior Function           Comments: suspect pt independent PTA, will need to confirm      Hand Dominance        Extremity/Trunk Assessment   Upper Extremity Assessment Upper Extremity Assessment: Defer to OT evaluation RUE Deficits / Details: flaccid RUE, no withdrawal  noted to noxious/painful stimuli RUE Coordination: decreased fine motor;decreased gross motor LUE Deficits / Details: pt with intermittent purposeful movements (moving hand towards face with coughing, attempting to hold herself up while EOB with L hand), not able to follow commands to formally assess, noted  tremor-like/jerking movement of LUE initially when assessing LEs    Lower Extremity Assessment Lower Extremity Assessment: LLE deficits/detail;RLE deficits/detail RLE Deficits / Details: no active movement noted on R, maybe mild withdraw to pain, PROM generally WFL, still heel cord LLE Deficits / Details: moving actively throughout session in response to questions/commands/pain, but not moving accurately on command       Communication   Communication: Other (comment)(on vent with ETT)  Cognition Arousal/Alertness: Lethargic Behavior During Therapy: Flat affect Overall Cognitive Status: Difficult to assess                                 General Comments: pt with almost no command follow, will initially open eyes with certain movements and becoming more awake/alert once upright, squeezing therapist's hand while seated EOB but unsure if pt following instruction or just reflex      General Comments General comments (skin integrity, edema, etc.): PS/CPAP setting on vent 40% FiO2 PEEP of 5    Exercises Other Exercises Other Exercises: PROM performed to R LE in supinew/ heel cord stretch   Assessment/Plan    PT Assessment Patient needs continued PT services  PT Problem List Decreased strength;Decreased activity tolerance;Decreased mobility;Decreased cognition;Decreased safety awareness;Cardiopulmonary status limiting activity;Decreased knowledge of precautions;Decreased balance;Decreased coordination       PT Treatment Interventions DME instruction;Therapeutic activities;Balance training;Cognitive remediation;Patient/family education;Therapeutic exercise;Functional mobility training    PT Goals (Current goals can be found in the Care Plan section)  Acute Rehab PT Goals Patient Stated Goal: pt unable to state PT Goal Formulation: Patient unable to participate in goal setting Time For Goal Achievement: 01/11/20 Potential to Achieve Goals: Fair    Frequency Min  4X/week   Barriers to discharge        Co-evaluation PT/OT/SLP Co-Evaluation/Treatment: Yes Reason for Co-Treatment: For patient/therapist safety;To address functional/ADL transfers;Complexity of the patient's impairments (multi-system involvement) PT goals addressed during session: Mobility/safety with mobility;Balance OT goals addressed during session: Strengthening/ROM       AM-PAC PT "6 Clicks" Mobility  Outcome Measure Help needed turning from your back to your side while in a flat bed without using bedrails?: Total Help needed moving from lying on your back to sitting on the side of a flat bed without using bedrails?: Total Help needed moving to and from a bed to a chair (including a wheelchair)?: Total Help needed standing up from a chair using your arms (e.g., wheelchair or bedside chair)?: Total Help needed to walk in hospital room?: Total Help needed climbing 3-5 steps with a railing? : Total 6 Click Score: 6    End of Session Equipment Utilized During Treatment: Other (comment)(vent) Activity Tolerance: Patient limited by fatigue Patient left: in bed;with call bell/phone within reach;with nursing/sitter in room   PT Visit Diagnosis: Other abnormalities of gait and mobility (R26.89);Hemiplegia and hemiparesis;Other symptoms and signs involving the nervous system (R29.898) Hemiplegia - Right/Left: Right Hemiplegia - dominant/non-dominant: Dominant Hemiplegia - caused by: Cerebral infarction    Time: 1335-1406 PT Time Calculation (min) (ACUTE ONLY): 31 min   Charges:   PT Evaluation $PT Eval High Complexity: 1 High          Cyndi  Rick Duff, [place] [phone] 12/28/2019   Reginia Naas 12/28/2019, 5:13 PM

## 2019-12-28 NOTE — Progress Notes (Signed)
Rehab Admissions Coordinator Note:  Per PT and OT recommendation, this patient was screened by Raechel Ache for appropriateness for an Inpatient Acute Rehab Consult.    Pt remains on vent and not yet appropriate for an IP Rehab Consult Order. Will follow and request order once medically appropriate.   Raechel Ache 12/28/2019, 5:27 PM  I can be reached at [phone].

## 2019-12-28 NOTE — Evaluation (Addendum)
Occupational Therapy Evaluation Patient Details Name: Robin [name] MRN: [id number] DOB: 10-Jul-1966 Today's Date: 12/28/2019    History of Present Illness 54 y.o. female with a history of DM, HTN, atrial fibrillation on Eliquis, anemia, CHF, COPD and polysubstance abuse, presenting initially to [facility] ED after being found unresponsive at home after family heard her fall. Pt transferred/admitted to [facility] for acute left MCA stroke and L ICA occlusion, is now s/p mechanical thrombectomy on 5/24, pt kept intubated post procedure.    Clinical Impression   This 54 y/o female presents with the above. Pt remains on vent this session (FiO2 40% PEEP 5) and no family present at time of eval (though per chart/RN report pt with supportive family/sister who have been present and were present earlier today). Pt intermittently opening eyes with certain movements, use of two Sapia totalA to trial sitting EOB with pt becoming more awake/alert, however notably restless and uncomfortable, with increased secretions. Pt with poor sitting balance and notable R side weakness requiring at least maxA for sitting balance and is currently totalA for ADL. Pt overall unable to follow simple commands today however noted with intermittent purposeful movements of LUE to attempt to support herself while sitting upright and bringing hand towards face with coughing. She will benefit from continued acute OT services and currently recommend follow up therapies at CIR level to maximize her overall safety and independence with ADL and mobility.     Follow Up Recommendations  CIR;Supervision/Assistance - 24 hour(will continue to assess )    Equipment Recommendations  Other (comment)(TBD)    Recommendations for Other Services Rehab consult     Precautions / Restrictions Precautions Precautions: Fall Precaution Comments: R hemi Restrictions Weight Bearing Restrictions: No      Mobility Bed Mobility Overal bed mobility:  Needs Assistance Bed Mobility: Supine to Sit;Sit to Supine     Supine to sit: Total assist;+2 for physical assistance Sit to supine: Total assist;+2 for physical assistance   General bed mobility comments: transitioning towards pt's R side. requires assist for LEs/trunk throughout transitions. Pt only able to tolerate sitting EOB for short period of time, with increased secretions and restless/appearing uncomfortable while upright  Transfers                 General transfer comment: deferred     Balance Overall balance assessment: Needs assistance Sitting-balance support: Feet supported Sitting balance-Leahy Scale: Poor Sitting balance - Comments: heavy reliance on external assist to maintain sitting balance  Postural control: Posterior lean                                 ADL either performed or assessed with clinical judgement   ADL Overall ADL's : Needs assistance/impaired                                       General ADL Comments: curently totalA     Vision   Additional Comments: pt tending to maintain eyes closed during session, will open intermittently but not for long durations of time. will continue to assess      Perception     Praxis      Pertinent Vitals/Pain Pain Assessment: Faces Faces Pain Scale: Hurts little more Pain Location: generalized discomfort with sitting upright Pain Descriptors / Indicators: Grimacing;Discomfort Pain Intervention(s): Limited activity within patient's  tolerance;Repositioned;Monitored during session     Hand Dominance     Extremity/Trunk Assessment Upper Extremity Assessment Upper Extremity Assessment: RUE deficits/detail;Difficult to assess due to impaired cognition;LUE deficits/detail RUE Deficits / Details: flaccid RUE, no withdrawal noted to noxious/painful stimuli RUE Coordination: decreased fine motor;decreased gross motor LUE Deficits / Details: pt with intermittent purposeful  movements (moving hand towards face with coughing, attempting to hold herself up while EOB with L hand), not able to follow commands to formally assess, noted tremor-like/jerking movement of LUE initially when assessing LEs   Lower Extremity Assessment Lower Extremity Assessment: Defer to PT evaluation       Communication Communication Communication: Tracheostomy;Other (comment);Deaf(pt currently on vent)   Cognition Arousal/Alertness: Lethargic Behavior During Therapy: Flat affect Overall Cognitive Status: Difficult to assess                                 General Comments: pt with almost no command follow, will initially open eyes with certain movements and becoming more awake/alert once upright, squeezing therapist's hand while seated EOB but unsure if pt following instruction or just reflex   General Comments       Exercises     Shoulder Instructions      Home Living Family/patient expects to be discharged to:: Private residence                                 Additional Comments: per chart and RN report, sister has been present and supportive. Will need to confirm home setup/PLOF      Prior Functioning/Environment          Comments: suspect pt independent PTA, will need to confirm         OT Problem List: Decreased strength;Decreased range of motion;Decreased activity tolerance;Impaired balance (sitting and/or standing);Impaired vision/perception;Decreased cognition;Decreased safety awareness;Decreased knowledge of use of DME or AE;Obesity;Impaired UE functional use;Cardiopulmonary status limiting activity;Decreased knowledge of precautions;Decreased coordination      OT Treatment/Interventions: Self-care/ADL training;Therapeutic exercise;Neuromuscular education;Energy conservation;DME and/or AE instruction;Therapeutic activities;Cognitive remediation/compensation;Patient/family education;Balance training    OT Goals(Current goals can be  found in the care plan section) Acute Rehab OT Goals Patient Stated Goal: none stated OT Goal Formulation: Patient unable to participate in goal setting Time For Goal Achievement: 01/11/20 Potential to Achieve Goals: Fair  OT Frequency: Min 2X/week   Barriers to D/C:            Co-evaluation PT/OT/SLP Co-Evaluation/Treatment: Yes Reason for Co-Treatment: For patient/therapist safety;To address functional/ADL transfers;Complexity of the patient's impairments (multi-system involvement) PT goals addressed during session: Mobility/safety with mobility;Balance OT goals addressed during session: Strengthening/ROM      AM-PAC OT "6 Clicks" Daily Activity     Outcome Measure Help from another Mcmaster eating meals?: Total Help from another Norcia taking care of personal grooming?: Total Help from another Knape toileting, which includes using toliet, bedpan, or urinal?: Total Help from another Brandy bathing (including washing, rinsing, drying)?: Total Help from another Schamp to put on and taking off regular upper body clothing?: Total Help from another Abplanalp to put on and taking off regular lower body clothing?: Total 6 Click Score: 6   End of Session Nurse Communication: Mobility status  Activity Tolerance: Patient tolerated treatment well;Patient limited by fatigue;Other (comment)(disfomfort/restless seated EOB) Patient left: in bed;with call bell/phone within reach;with nursing/sitter in room  OT Visit Diagnosis: Other abnormalities  of gait and mobility (R26.89);Muscle weakness (generalized) (M62.81);Hemiplegia and hemiparesis Hemiplegia - Right/Left: Right Hemiplegia - caused by: Cerebral infarction                Time: 1340-1406 OT Time Calculation (min): 26 min Charges:  OT General Charges $OT Visit: 1 Visit OT Evaluation $OT Eval High Complexity: 1 High  Lou Cal, OT Acute Rehabilitation Services Pager [phone] Office [phone]   Raymondo Band 12/28/2019, 4:58 PM

## 2019-12-28 NOTE — Progress Notes (Signed)
Patient transported from 4N28 to CT and back with no complications. 

## 2019-12-29 ENCOUNTER — Inpatient Hospital Stay (HOSPITAL_COMMUNITY): Payer: 59

## 2019-12-29 DIAGNOSIS — I63512 Cerebral infarction due to unspecified occlusion or stenosis of left middle cerebral artery: Secondary | ICD-10-CM

## 2019-12-29 LAB — GLUCOSE, CAPILLARY
Glucose-Capillary: 161 mg/dL — ABNORMAL HIGH (ref 70–99)
Glucose-Capillary: 221 mg/dL — ABNORMAL HIGH (ref 70–99)
Glucose-Capillary: 249 mg/dL — ABNORMAL HIGH (ref 70–99)
Glucose-Capillary: 256 mg/dL — ABNORMAL HIGH (ref 70–99)
Glucose-Capillary: 281 mg/dL — ABNORMAL HIGH (ref 70–99)

## 2019-12-29 LAB — BASIC METABOLIC PANEL
Anion gap: 11 (ref 5–15)
BUN: 17 mg/dL (ref 6–20)
CO2: 24 mmol/L (ref 22–32)
Calcium: 8.5 mg/dL — ABNORMAL LOW (ref 8.9–10.3)
Chloride: 118 mmol/L — ABNORMAL HIGH (ref 98–111)
Creatinine, Ser: 0.87 mg/dL (ref 0.44–1.00)
GFR calc Af Amer: >60 mL/min (ref >60)
GFR calc non Af Amer: >60 mL/min (ref >60)
Glucose, Bld: 267 mg/dL — ABNORMAL HIGH (ref 70–99)
Potassium: 3.6 mmol/L (ref 3.5–5.1)
Sodium: 153 mmol/L — ABNORMAL HIGH (ref 135–145)

## 2019-12-29 LAB — SODIUM
Sodium: 151 mmol/L — ABNORMAL HIGH (ref 135–145)
Sodium: 154 mmol/L — ABNORMAL HIGH (ref 135–145)
Sodium: 154 mmol/L — ABNORMAL HIGH (ref 135–145)
Sodium: 153 mmol/L — ABNORMAL HIGH (ref 135–145)

## 2019-12-29 LAB — CBC
HCT: 39.8 % (ref 36.0–46.0)
Hemoglobin: 12.2 g/dL (ref 12.0–15.0)
MCH: 29.5 pg (ref 26.0–34.0)
MCHC: 30.7 g/dL (ref 30.0–36.0)
MCV: 96.1 fL (ref 80.0–100.0)
Platelets: 193 10*3/uL (ref 150–400)
RBC: 4.14 MIL/uL (ref 3.87–5.11)
RDW: 14.6 % (ref 11.5–15.5)
WBC: 9.3 10*3/uL (ref 4.0–10.5)
nRBC: 0 % (ref 0.0–0.2)

## 2019-12-29 IMAGING — CT CT HEAD W/O CM
3 series · 17 of 40 positions shown, 19 images · non-contrast
Comparison: [DATE]

CLINICAL DATA: Stroke follow-up

EXAM:
CT HEAD WITHOUT CONTRAST
TECHNIQUE: Contiguous axial images were obtained from the base of the skull
through the vertex without intravenous contrast.

[Series 3: head wo · axial · 0.42mm/px · z∈[-130,-10]mm · 7 of 34 slices shown, 9 images]
[im 5/34  brain]
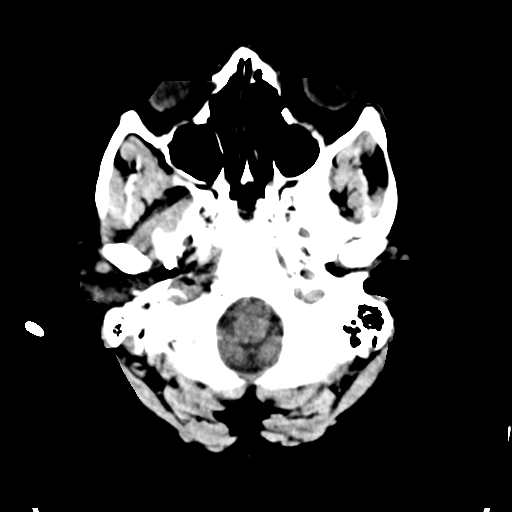
[im 5/34  bone]
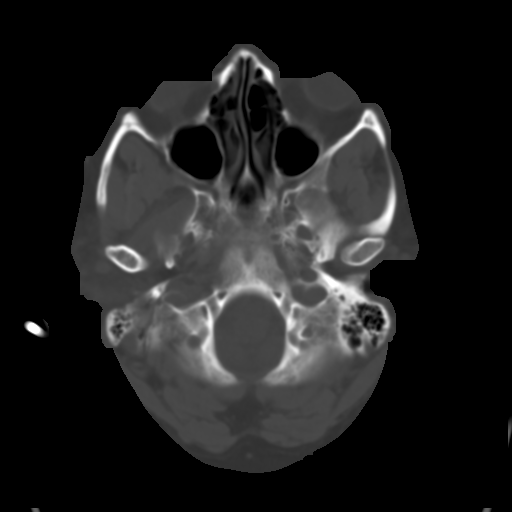
[im 9/34  brain]
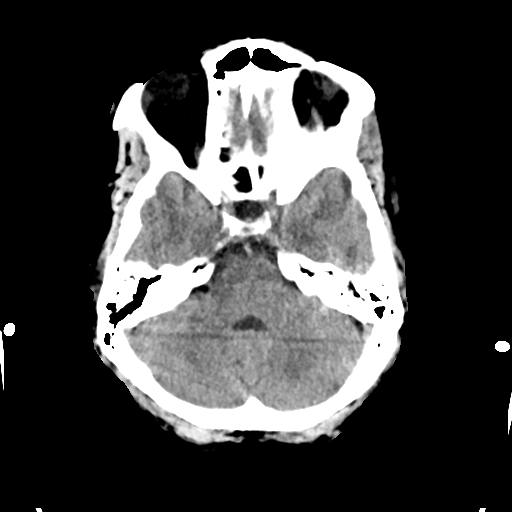
[im 13/34  brain]
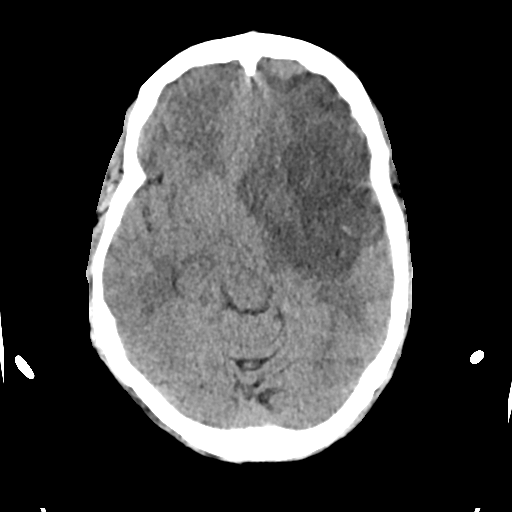
[im 17/34  brain]
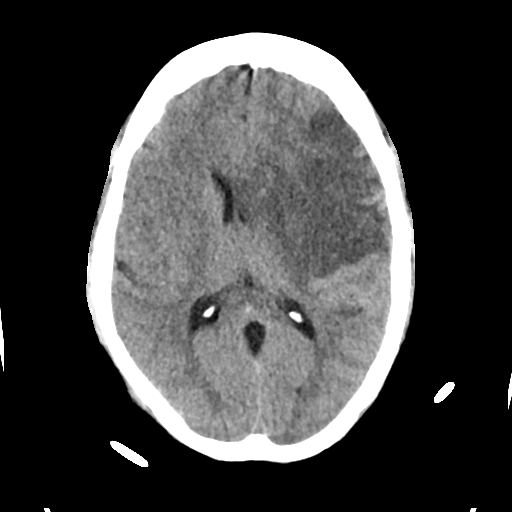
[im 21/34  brain]
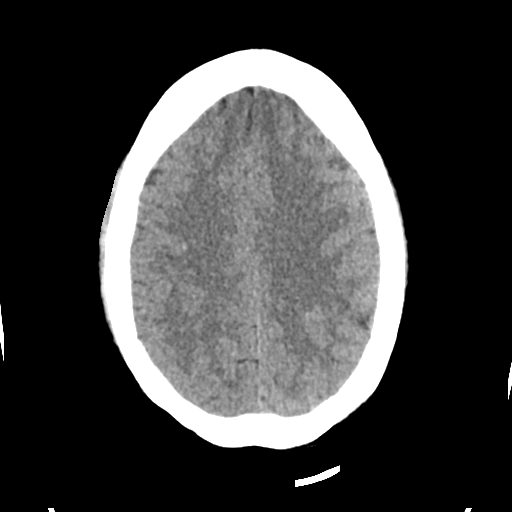
[im 21/34  bone]
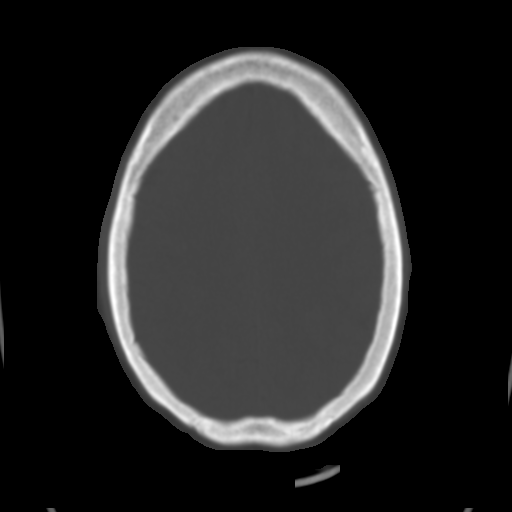
[im 25/34  brain]
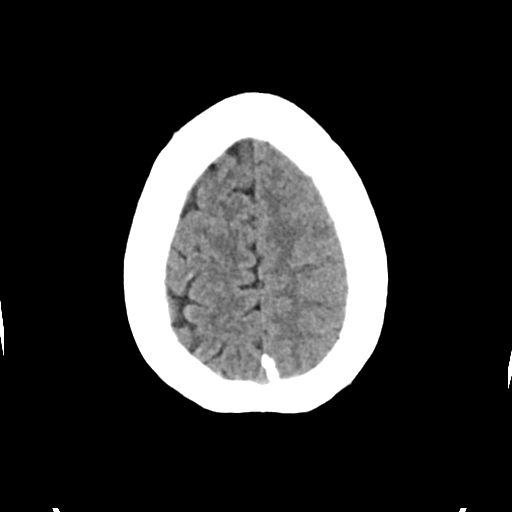
[im 29/34  brain]
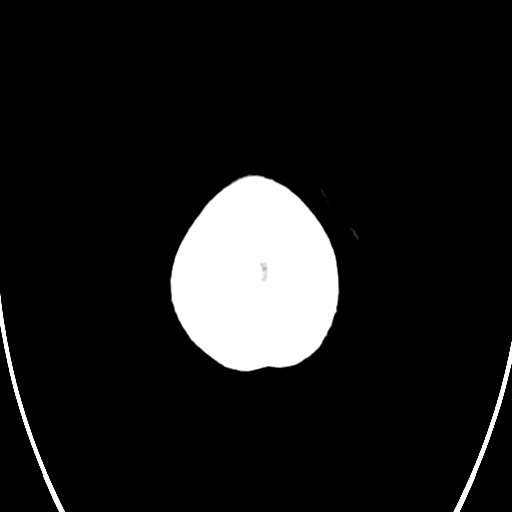

[Series 4: head bone · axial · 0.42mm/px · z∈[-134,-20]mm · 7 of 83 slices shown]
[im 9/83  bone]
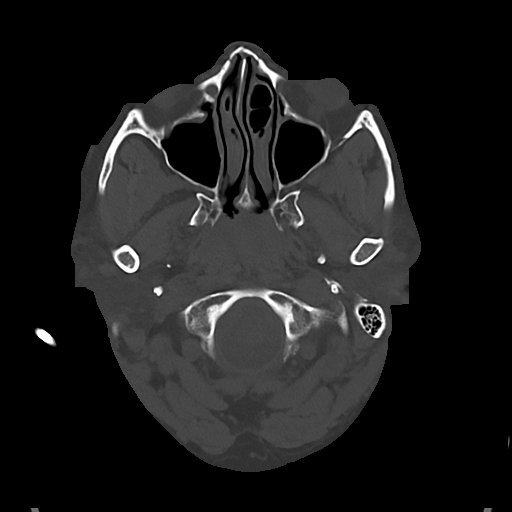
[im 17/83  bone]
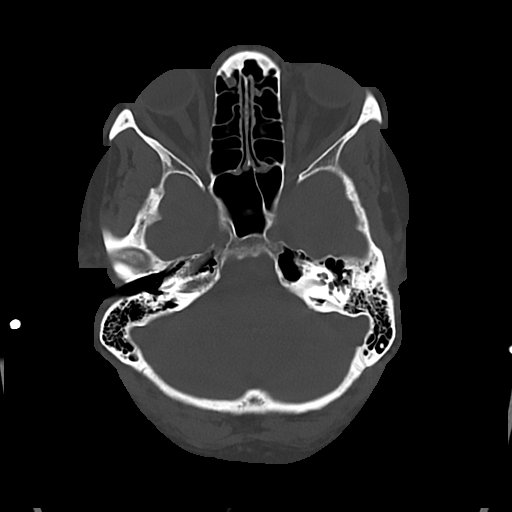
[im 25/83  bone]
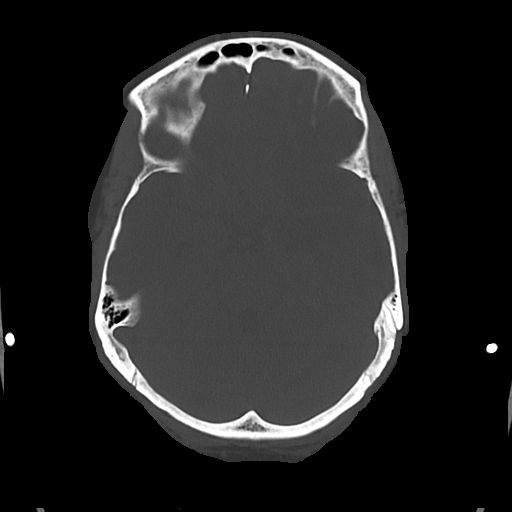
[im 37/83  bone]
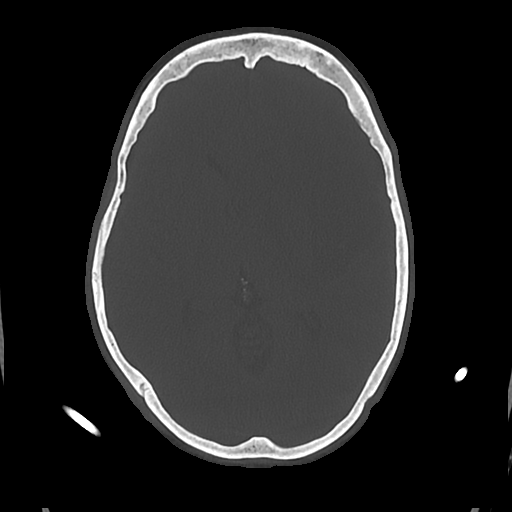
[im 46/83  bone]
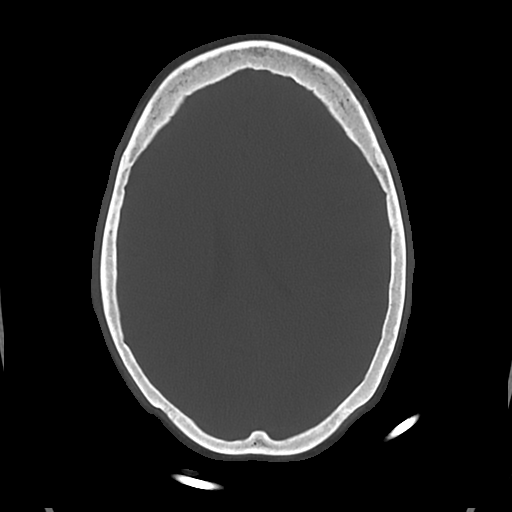
[im 58/83  bone]
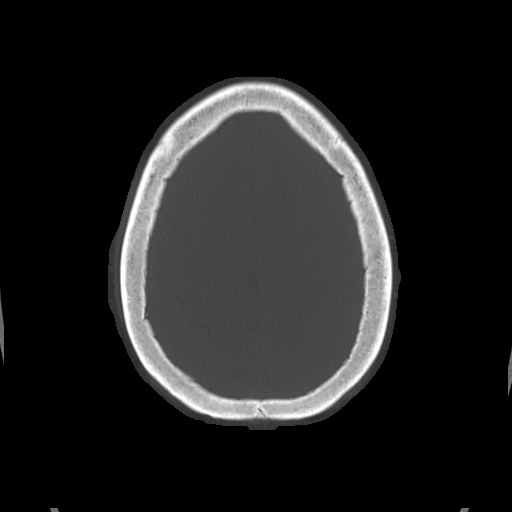
[im 66/83  bone]
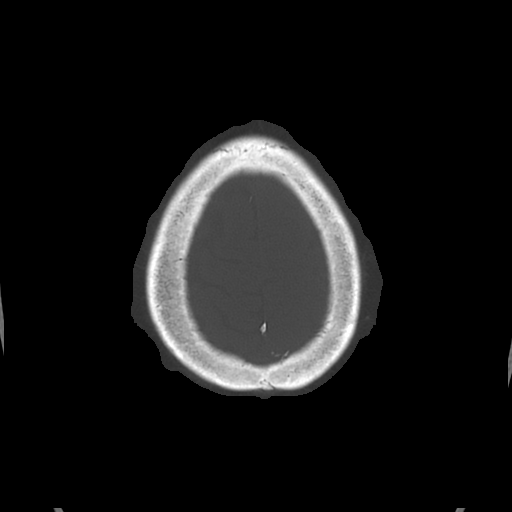

[Series 5: cor soft · coronal · 0.34mm/px · 3 of 68 slices shown]
[im 23/68  brain]
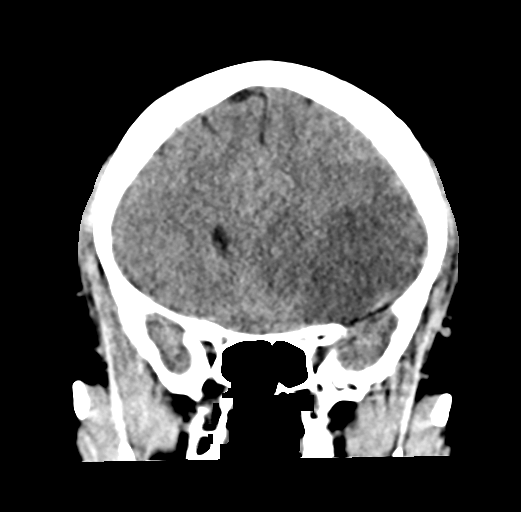
[im 30/68  brain]
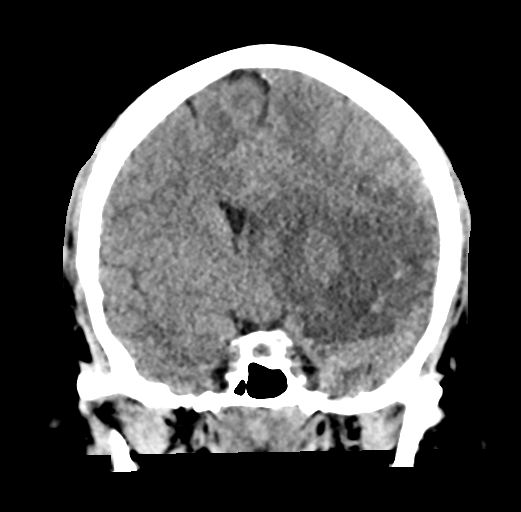
[im 38/68  brain]
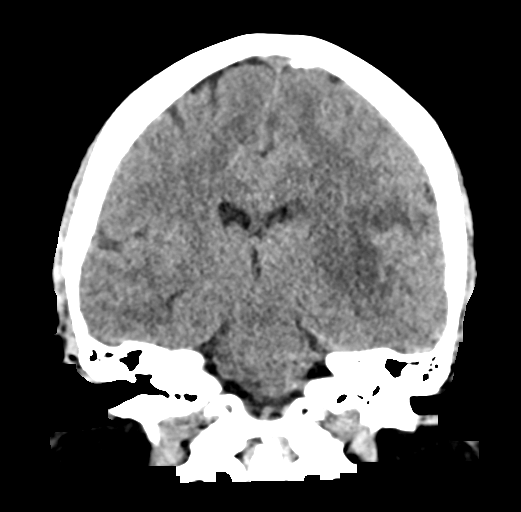

[17 of 40 positions shown; findings below may reference images not displayed]

FINDINGS: Brain: Large anterior left MCA territory infarct with associated
cytotoxic edema and petechial hemorrhage. There is 8 mm of rightward
midline shift, unchanged. Mass effect on the lateral ventricles is
unchanged. No hydrocephalus or ventricular entrapment.

Vascular: No abnormal hyperdensity of the major intracranial
arteries or dural venous sinuses. No intracranial atherosclerosis.

Skull: The visualized skull base, calvarium and extracranial soft
tissues are normal.

Sinuses/Orbits: No fluid levels or advanced mucosal thickening of
the visualized paranasal sinuses. No mastoid or middle ear effusion.
The orbits are normal.
IMPRESSION: 1. Unchanged appearance of large anterior left MCA territory infarct
with petechial hemorrhage.
2. Unchanged 8 mm of rightward midline shift.

## 2019-12-29 MED ORDER — INSULIN GLARGINE 100 UNIT/ML ~~LOC~~ SOLN
40.0000 [IU] | Freq: Every day | SUBCUTANEOUS | Status: DC
  Administered 2019-12-29: 40 [IU] via SUBCUTANEOUS
  Filled 2019-12-29 (×2): qty 0.4

## 2019-12-29 MED ORDER — ISOSORB DINITRATE-HYDRALAZINE 20-37.5 MG PO TABS
1.0000 | ORAL_TABLET | Freq: Three times a day (TID) | ORAL | Status: DC
  Administered 2019-12-29 – 2019-12-30 (×5): 1
  Filled 2019-12-29 (×7): qty 1

## 2019-12-29 MED ORDER — HYDRALAZINE HCL 20 MG/ML IJ SOLN
10.0000 mg | INTRAMUSCULAR | Status: DC | PRN
  Administered 2019-12-29: 10 mg via INTRAVENOUS
  Filled 2019-12-29: qty 1

## 2019-12-29 MED ORDER — INSULIN ASPART 100 UNIT/ML ~~LOC~~ SOLN
2.0000 [IU] | SUBCUTANEOUS | Status: DC
  Administered 2019-12-29 – 2019-12-30 (×6): 2 [IU] via SUBCUTANEOUS

## 2019-12-29 NOTE — Anesthesia Postprocedure Evaluation (Signed)
Anesthesia Post Note  Patient: Robin [name]  Procedure(s) Performed: IR WITH ANESTHESIA (N/A )     Patient location during evaluation: SICU Anesthesia Type: General Level of consciousness: sedated and patient remains intubated per anesthesia plan Pain management: pain level controlled Vital Signs Assessment: post-procedure vital signs reviewed and stable Respiratory status: patient remains intubated per anesthesia plan and patient on ventilator - see flowsheet for VS Cardiovascular status: stable Postop Assessment: no apparent nausea or vomiting Anesthetic complications: no    Last Vitals:  Vitals:   12/29/19 1600 12/29/19 1700  BP: 121/88 129/74  Pulse: 93 97  Resp: 16 16  Temp: 37.5 C   SpO2: 99% 100%    Last Pain:  Vitals:   12/29/19 1600  TempSrc: Oral  PainSc:                  Divit Stipp COKER

## 2019-12-29 NOTE — Progress Notes (Signed)
Patient transported to CT and back to 4N28 without any complications.

## 2019-12-29 NOTE — Progress Notes (Signed)
Inpatient Rehabilitation-Admissions Coordinator   CIR consult order received. Pt still vented. Will follow up once appropriate.   Raechel Ache, OTR/L  Rehab Admissions Coordinator  [phone] 12/29/2019 1:06 PM

## 2019-12-29 NOTE — Procedures (Signed)
Cortrak  [place] Inserting Tube:  Tira Lafferty M, RD Tube Type:  Cortrak - 43 inches Tube Location:  Right nare Initial Placement:  Stomach Secured by: Bridle Technique Used to Measure Tube Placement:  Documented cm marking at nare/ corner of mouth Cortrak Secured At:  74 cm Procedure Comments:  Cortrak Tube Team Note:  Consult received to place a Cortrak feeding tube.   No x-ray is required. RN may begin using tube.    If the tube becomes dislodged please keep the tube and contact the Cortrak team at [email or web address] (password [email or web address]) for replacement.  If after hours and replacement cannot be delayed, place a NG tube and confirm placement with an abdominal x-ray.      Gianella Chismar, MS, RD, LDN, CNSC Inpatient Clinical Dietitian RD pager # available in AMION  After hours/weekend pager # available in AMION      

## 2019-12-29 NOTE — Progress Notes (Signed)
Physical Therapy Treatment Patient Details Name: Robin [name] MRN: [id number] DOB: 04-21-1966 Today's Date: 12/29/2019    History of Present Illness 54 y.o. female with a history of DM, HTN, atrial fibrillation on Eliquis, anemia, CHF, COPD and polysubstance abuse, presenting initially to [facility] ED after being found unresponsive at home after family heard her fall. Pt transferred/admitted to [facility] for acute left MCA stroke and L ICA occlusion, is now s/p mechanical thrombectomy on 5/24, pt kept intubated post procedure.     PT Comments    Patient progressing today able to assist some with coming up to sit EOB as well as to supine lifting her L leg.  She still is not quick nor consistent to follow commands, though did participate in some mobility skills with cues.  She would not open her eyes sitting EOB and had wrinkle in her brow as if uncomfortable and seemingly distracted by vent as well as possibly multiple commands from PT and her family members.  She was more stable at the bedside with BP 152/98 and HR ranging 110-98 bpm.  She did not exhibit signs of diaphoresis as she did yesterday.  Feel she will progress well and has good family support for follow up CIR level rehab upon d/c   Follow Up Recommendations  CIR;Supervision/Assistance - 24 hour     Equipment Recommendations  Hospital bed;Wheelchair (measurements PT);Wheelchair cushion (measurements PT);3in1 (PT)    Recommendations for Other Services       Precautions / Restrictions Precautions Precautions: Fall Precaution Comments: R hemi, weaning on vent    Mobility  Bed Mobility Overal bed mobility: Needs Assistance Bed Mobility: Rolling;Sidelying to Sit;Sit to Supine Rolling: Max assist;+2 for physical assistance Sidelying to sit: Max assist;+2 for physical assistance;HOB elevated   Sit to supine: Mod assist;+2 for physical assistance   General bed mobility comments: cues for rolling R assist to flex L LE and pt helped  turn head R, max A to roll with pad under hips, then assist for legs off bed and trunk upright, pt assisting some with L UE to pull her trunk upright once already initiated; to sidelying pt lifting L LE on her own and prepping to lie down when cued  Transfers                 General transfer comment: deferred   Ambulation/Gait                 Stairs             Wheelchair Mobility    Modified Rankin (Stroke Patients Only)       Balance Overall balance assessment: Needs assistance Sitting-balance support: Feet supported Sitting balance-Leahy Scale: Poor Sitting balance - Comments: min-mod A +2 for sitting balance at times leaning R and at times posterior; EOB about 8-10 minutes working on head control, attempting to engage with eye opening and family recognition                                    Cognition Arousal/Alertness: Lethargic Behavior During Therapy: Flat affect Overall Cognitive Status: Difficult to assess                                 General Comments: initiating today with L side once discussed returning to supine to prep for lying down, would not  open eyes to command, but hand them open some in supine, assisted to rotate head R when cued in prep to roll R, lifting head some in response to her sister & daughters cues/voices      Exercises Other Exercises Other Exercises: demonstrated R finger/hand ROM and R ankle ROM for family to assist with as well as importance of positioning for R arm to prevent subluxation    General Comments General comments (skin integrity, edema, etc.): on vent PS/CPAP 30% FiO2 PEEP of 5; educated family on considering ramps for home and need for once in rehab to have door measurements      Pertinent Vitals/Pain Pain Assessment: Faces Faces Pain Scale: Hurts little more Pain Location: generalized discomfort with sitting upright Pain Descriptors / Indicators: Grimacing;Discomfort Pain  Intervention(s): Monitored during session;Repositioned;Limited activity within patient's tolerance    Home Living Family/patient expects to be discharged to:: Private residence Living Arrangements: Spouse/significant other(will likely go to son or daughter's home) Available Help at Discharge: Family;Available 24 hours/day Type of Home: House Home Access: Stairs to enter Entrance Stairs-Rails: Right Home Layout: One level Home Equipment: None      Prior Function Level of Independence: Independent      Comments: worked two 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, 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   PT Goals (current goals can now be found in the care plan section) Progress towards PT goals: Progressing toward goals    Frequency    Min 4X/week      PT Plan Current plan remains appropriate    Co-evaluation              AM-PAC PT "6 Clicks" Mobility   Outcome Measure  Help needed turning from your back to your side while in a flat bed without using bedrails?: Total Help needed moving from lying on your back to sitting on the side of a flat bed without using bedrails?: Total Help needed moving to and from a bed to a chair (including a wheelchair)?: Total Help needed standing up from a chair using your arms (e.g., wheelchair or bedside chair)?: Total Help needed to walk in hospital room?: Total Help needed climbing 3-5 steps with a railing? : Total 6 Click Score: 6    End of Session Equipment Utilized During Treatment: Other (comment)(vent) Activity Tolerance: Patient limited by fatigue Patient left: in bed;with call bell/phone within reach;with family/visitor present   PT Visit Diagnosis: Other abnormalities of gait and mobility (R26.89);Hemiplegia and hemiparesis;Other symptoms and signs involving the nervous system (R29.898) Hemiplegia - Right/Left: Right Hemiplegia - dominant/non-dominant: Dominant Hemiplegia - caused by: Cerebral infarction     Time: [id number] PT Time Calculation (min) (ACUTE  ONLY): 30 min  Charges:  $Therapeutic Activity: 23-37 mins                     Robin [name], [place] [phone] 12/29/2019    Robin [name] 12/29/2019, 1:28 PM

## 2019-12-29 NOTE — Progress Notes (Signed)
Inpatient Diabetes Program Recommendations  AACE/ADA: New Consensus Statement on Inpatient Glycemic Control (2015)  Target Ranges:  Prepandial:   less than 140 mg/dL      Peak postprandial:   less than 180 mg/dL (1-2 hours)      Critically ill patients:  140 - 180 mg/dL   Lab Results  Component Value Date   GLUCAP 256 (H) 12/29/2019   HGBA1C 11.6 (H) 12/27/2019    Review of Glycemic Control Results for Robin [name], Robin [name] (MRN [id number]) as of 12/29/2019 10:07  Ref. Range 12/28/2019 11:10 12/28/2019 15:29 12/28/2019 19:33 12/28/2019 23:21 12/29/2019 03:37  Glucose-Capillary Latest Ref Range: 70 - 99 mg/dL 253 (H) 209 (H) 187 (H) 261 (H) 256 (H)   Diabetes history:  DM2  Outpatient Diabetes medications:  Trulicity [id number] q week  0000000 30 units bid Metformin 1000 daily  Current orders for Inpatient glycemic control:  Lantus 10 daily  novolog 0-20 q4h  Vital HP tube feeds @ 71ml/hr continuous  Inpatient Diabetes Program Recommendations:     Novolog 2-3 units q4h tube feed coverage.  Stop if tube feeds held or discontinued  Thank you, Reche Dixon, RN, BSN Diabetes Coordinator Inpatient Diabetes Program [phone] (team pager from 8a-5p)

## 2019-12-29 NOTE — Progress Notes (Signed)
STROKE TEAM PROGRESS NOTE   INTERVAL HISTORY Her sister is at the bedside. Said she was alert earlier this am. More interactive during exam. Hope to extubate today.  CT scan of the head from this morning shows stable appearance of the left MCA infarct with stable cytotoxic edema and midline shift without any further increase.  Serum sodium was 154 and hypertonic saline drip has been held  Vitals:   12/29/19 0600 12/29/19 0700 12/29/19 0740 12/29/19 0800  BP: (!) 147/99 (!) 149/95 (!) 167/88 (!) 162/110  Pulse: 99 96 (!) 102 (!) 113  Resp: 16 17 18 14   Temp:    97.9 F (36.6 C)  TempSrc:    Axillary  SpO2: 100% 100% 100% 100%  Weight:      Height:       CBC:  Recent Labs  Lab 12/26/19 2254 12/26/19 2254 12/27/19 0305 12/29/19 0440  WBC 8.1   < > 7.9 9.3  NEUTROABS 5.1  --  6.1  --   HGB 13.5   < > 13.3 12.2  HCT 42.0   < > 41.6 39.8  MCV 91.7   < > 91.6 96.1  PLT 221   < > 217 193   < > = values in this interval not displayed.   Basic Metabolic Panel:  Recent Labs  Lab 12/28/19 0443 12/28/19 0843 12/28/19 1855 12/29/19 0120 12/29/19 0440 12/29/19 0630  NA 141   < > 148*   < > 153* 154*  K 4.0  --   --   --  3.6  --   CL 107  --   --   --  118*  --   CO2 25  --   --   --  24  --   GLUCOSE 160*  --   --   --  267*  --   BUN 18  --   --   --  17  --   CREATININE 1.07*  --   --   --  0.87  --   CALCIUM 8.5*  --   --   --  8.5*  --   MG 2.2  --  2.1  --   --   --   PHOS 3.3  --  2.5  --   --   --    < > = values in this interval not displayed.   Lipid Panel:     Component Value Date/Time   CHOL 121 12/27/2019 0305   TRIG 128 12/27/2019 0305   HDL 27 (L) 12/27/2019 0305   CHOLHDL 4.5 12/27/2019 0305   VLDL 26 12/27/2019 0305   LDLCALC 68 12/27/2019 0305   HgbA1c:  Lab Results  Component Value Date   HGBA1C 11.6 (H) 12/27/2019   Urine Drug Screen:     Component Value Date/Time   LABOPIA NONE DETECTED 12/26/2019 2249   COCAINSCRNUR POSITIVE (A)  12/26/2019 2249   LABBENZ NONE DETECTED 12/26/2019 2249   AMPHETMU NONE DETECTED 12/26/2019 2249   THCU NONE DETECTED 12/26/2019 2249   LABBARB NONE DETECTED 12/26/2019 2249    Alcohol Level     Component Value Date/Time   ETH <10 12/26/2019 2255   IMAGING past 24 hours CT HEAD WO CONTRAST  Result Date: 12/29/2019 CLINICAL DATA:  Stroke follow-up EXAM: CT HEAD WITHOUT CONTRAST TECHNIQUE: Contiguous axial images were obtained from the base of the skull through the vertex without intravenous contrast. COMPARISON:  12/28/2019 FINDINGS: Brain: Large anterior left MCA territory  infarct with associated cytotoxic edema and petechial hemorrhage. There is 8 mm of rightward midline shift, unchanged. Mass effect on the lateral ventricles is unchanged. No hydrocephalus or ventricular entrapment. Vascular: No abnormal hyperdensity of the major intracranial arteries or dural venous sinuses. No intracranial atherosclerosis. Skull: The visualized skull base, calvarium and extracranial soft tissues are normal. Sinuses/Orbits: No fluid levels or advanced mucosal thickening of the visualized paranasal sinuses. No mastoid or middle ear effusion. The orbits are normal. IMPRESSION: 1. Unchanged appearance of large anterior left MCA territory infarct with petechial hemorrhage. 2. Unchanged 8 mm of rightward midline shift. Electronically Signed   By: Ulyses Jarred M.D.   On: 12/29/2019 03:44    PHYSICAL EXAM   Obese middle-aged African-American lady who is intubated and not sedated. . Afebrile. Head is nontraumatic. Neck is supple without bruit.    Cardiac exam no murmur or gallop. Lungs are clear to auscultation. Distal pulses are well felt. Neurological Exam :  Patient is awake with eyes  Open but is globally aphasic and does not follow any commands.  Slight left gaze preference.  Doll's eye movements are sluggish.  Pupils equal reactive.  Fundi could not be visualized.  Spontaneous left upper and lower extremity  movements against gravity..  Right hemiplegia with right upper extremity flaccidity and hypotonia with no withdrawal even to painful stimuli.  Trace withdrawal in the right lower extremity to painful stimuli.  Deep tendon flexes are depressed on the right and present on the left.  Right plantar equivocal left downgoing.  Gait not tested.  ASSESSMENT/PLAN [name] is a 54 y.o. female with history of DM, HTN, atrial fibrillation on Eliquis, anemia, CHF, COPD and polysubstance abuse presenting to [facility] with R hemiparesis, unresponsive. Found to have L MCA infarct transferred to [facility]. [facility] for thrombectomy .   Stroke:   L MCA infarct w/ petechial hemorrhage embolic in setting of cocaine use with AF on eliquis and cardiomyopathy  Code Stroke C T head evolving hypodensity L basal ganglia, L insula, L temporal lobe. ? Hyperdensity L ICA terminus. ASPECTS 6    CTA head & neck L ICA terminus occlusion w/ distal reconstitution mid and idstal L M1 w/ attenuated flow distally. Mild L ICA bifurcation atherosclerosis. Short severe proximal R V1 stenosis. 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   CT perfusion 24cc core infarct deep and subcortical white matter anterior L frontal lobe. 214cc penumbra.  Cerebral angio L ICA occlusion w/ no flow in ACA or MCA. TICI3 revascularization ACA and MCA. Earleen Newport)  Post IR CT no hemorrhage, no stain   MRI large patchy left MCA infarct with hemorrhagic petechial transformation.  Repeat CT 5/25 L MCA infarct w/ petechial hemorrhage, now w/ 43mm midline shift   Repeat CT 5/26 unchanged hemorrhage and shift  2D Echo decreased ejection fraction 30 to 35%.  No definite clot.  LDL 68  HgbA1c 11.6  Heparin 5000 units sq tid for VTE prophylaxis  Eliquis (apixaban) daily prior to admission, now on aspirin 81 mg daily. consider IV heparin 5/27 as transition to oral anticoagulants once cerebral edema stabilized  Therapy  recommendations:  pending   Disposition:  pending   Acute Respiratory Failure  Intubated for IR, left intubated for airway protection   CXR streaky opacities favoring atx yest, today w/ post op atx  Hope for extubation today  CCM on board  Cytotoxic Cerebral Edema  49mm midline shift seen on CT 5/25 am  Started 3% hypertonic saline protocol   Na 136-141-142-151-153-154  3% off this am (was at 75h) with rising Na  Goal Na 150-155  Check Na q6h  Repeat CT 5/26 unchanged hemorrhage and shift  Atrial Fibrillation  Home anticoagulation:  Eliquis (apixaban) daily   On Coreg 50 bid, digoxin 0.125 daily PTA  On diltiazem gtt - off  Cardiomyopathy  2D Echo decreased ejection fraction 30 to 35%.  No definite clot.  Hypertension  Home meds:  Hydralazine 50 tid . BP goal < 180 . On coreg 50 bid, dig 0.125, torsemide 20, prn labetalol . Unable to get less than goal following 7 doses of labetalol. Prn hydralazine with ok results . Long-term BP goal normotensive  Diabetes type II Uncontrolled  Home meds:  trulicity [id number] weekly, 0000000 30u bid, metformin 1000  HgbA1c 11.6, goal < 7.0  CBGs Recent Labs    12/28/19 1933 12/28/19 2321 12/29/19 0337  GLUCAP 187* 261* 256*      SSI increased  Holding metformin  On launtus 10  Dysphagia . Secondary to stroke . NPO . On TF  . Speech on board   Other Stroke Risk Factors  Former Cigarette smoker  ETOH use, alcohol level <10, will advise to drink no more than 1 drink(s) a day. Family aware  Polysubstance abuse, hx cocaine and marijuana- UDS:  THC NEGATIVE, Cocaine POSITIVE. Will advise to stop using due to stroke risk. Family aware.  Obesity, Body mass index is 35.87 kg/m., recommend weight loss, diet and exercise as appropriate   Congestive heart failure, on torsemide 20 bid ->decrease to daily given slight increase in Cre   Other Active Problems  Fatty liver  COPD  GERD  Hospital day #  2  Continue to hold hypertonic saline drip till serum sodium drops below 150.  Continue weaning off ventilatory support as per pulmonary critical care team and extubate if tolerated.  Discussed with Dr. Doyne Keel and patient's sister and answered questions.  This patient is critically ill and at significant risk of neurological worsening, death and care requires constant monitoring of vital signs, hemodynamics,respiratory and cardiac monitoring, extensive review of multiple databases, frequent neurological assessment, discussion with family, other specialists and medical decision making of high complexity.I have made any additions or clarifications directly to the above note.This critical care time does not reflect procedure time, or teaching time or supervisory time of PA/NP/Med Resident etc but could involve care discussion time.  I spent 30 minutes of neurocritical care time  in the care of  this patient.  Antony Contras, MD    To contact Stroke Continuity provider, please refer to [email or web address] After hours, contact General Neurology

## 2019-12-29 NOTE — Consult Note (Signed)
Physical Medicine and Rehabilitation Consult Reason for Consult: Right side weakness Referring Physician: Dr. Leonie Man   HPI: Robin [name] is a 54 y.o. right-handed female with history of diastolic congestive heart failure, hypertension, atrial fibrillation on Eliquis, COPD, polysubstance abuse, diabetes mellitus and tobacco abuse.  Presented 12/27/2019 [facility] with acute onset of right-sided weakness and altered mental status as well as aphasia.  Cranial CT scan showed question evolving hypodensity involving the left basal ganglia left insula and adjacent left temporal lobe.  CT perfusion scan as well as CTA head and neck showed a 24 cc acute infarct deep and subcortical white matter of the anterior left frontal region.  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  Patient did not receive TPA.  Transferred to [facility].  MRI evolving left MCA territory infarct with hemorrhagic transformation.  Mass-effect with 6 mm rightward midline shift.  Admission chemistries urine drug screen positive cocaine, glucose 355, creatinine 1.15, troponin negative, BNP 367, alcohol negative.  Echocardiogram with ejection fraction of 35% with global hypokinesis.  Underwent mechanical thrombectomy per interventional radiology and required intubation.  Patient was extubated 12/28/2019.  Aspirin initiated for CVA prophylaxis.  Subcutaneous heparin for DVT prophylaxis.  Currently n.p.o. with alternative means of nutritional support.  Therapy evaluations completed with recommendations of physical medicine rehab consult.  Sister [name] and Daughter Dee-Dee by bedsideMichela Pitcher pt had a large BM Tuesday night- -pt denied pain with nodding her head yes/no-    Review of Systems  Unable to perform ROS: Patient nonverbal   Past Medical History:  Diagnosis Date  . Allergy   . Anemia   . Arthritis   . CHF (congestive heart failure) ([place])    a. EF at 30-35% by echo in 08/2018 b. EF at 35% by  repeat echo in 05/2019  . Chronic abdominal pain   . Cocaine abuse ([place])   . COPD (chronic obstructive pulmonary disease) ([place])   . Essential hypertension, benign   . Fatty liver   . GERD (gastroesophageal reflux disease)   . Gout 2016  . Normal coronary arteries    3/10 - following abnormal Myoview  . Ovarian cyst   . Type 2 diabetes mellitus ([place])    Past Surgical History:  Procedure Laterality Date  . ABDOMINAL HYSTERECTOMY  09/10/2011   Procedure: HYSTERECTOMY ABDOMINAL;  Surgeon: Jonnie Kind, MD;  Location: [facility];  Service: Gynecology;  Laterality: N/A;  Abdominal hysterectomy  . CESAREAN SECTION  [id number], and 1994  . CHOLECYSTECTOMY  1995  . IR CT HEAD LTD  12/27/2019  . IR PERCUTANEOUS ART THROMBECTOMY/INFUSION INTRACRANIAL INC DIAG ANGIO  12/27/2019  . IR US GUIDE VASC ACCESS RIGHT  12/27/2019  . RADIOLOGY WITH ANESTHESIA N/A 12/27/2019   Procedure: IR WITH ANESTHESIA;  Surgeon: Luanne Bras, MD;  Location: [place];  Service: Radiology;  Laterality: N/A;  . SCAR REVISION  09/10/2011   Procedure: SCAR REVISION;  Surgeon: Jonnie Kind, MD;  Location: [facility];  Service: Gynecology;  Laterality: N/A;  Wide Excision of old Cicatrix  . TUBAL LIGATION  1994   Family History  Problem Relation Age of Onset  . Cirrhosis Mother   . Early death Mother   . Alcohol abuse Mother   . Diabetes type II Father   . Alcohol abuse Father   . Diabetes type II Sister   . Early death Brother   . Alcohol abuse Son   .  Anesthesia problems Neg Hx   . Hypotension Neg Hx   . Malignant hyperthermia Neg Hx   . Pseudochol deficiency Neg Hx   . Colon cancer Neg Hx   . Colon polyps Neg Hx   . Esophageal cancer Neg Hx   . Rectal cancer Neg Hx   . Stomach cancer Neg Hx    Social History:  reports that she has quit smoking. Her smoking use included cigarettes. She has a 7.25 pack-year smoking history. She has never used smokeless tobacco. She reports current alcohol use. She reports previous  drug use. Drugs: Cocaine and Marijuana. Allergies:  Allergies  Allergen Reactions  . Bee Venom Shortness Of Breath and Swelling    Bodily Swelling  . Losartan Other (See Comments)    Nosebleeds per patient report.   . Naproxen Other (See Comments)    Effects acid reflux  . Penicillins Nausea Only    Has patient had a PCN reaction causing immediate rash, facial/tongue/throat swelling, SOB or lightheadedness with hypotension: no Has patient had a PCN reaction causing severe rash involving mucus membranes or skin necrosis: no Has patient had a PCN reaction that required hospitalization no Has patient had a PCN reaction occurring within the last 10 years: no If all of the above answers are "NO", then may proceed with Cephalosporin   . Lisinopril     Sinus congestion   Medications Prior to Admission  Medication Sig Dispense Refill  . acetaminophen (TYLENOL) 325 MG tablet Take 2 tablets (650 mg total) by mouth every 6 (six) hours as needed for mild pain, fever or headache (or Fever >/= 101). 12 tablet 0  . albuterol (PROVENTIL) (2.5 MG/3ML) 0.083% nebulizer solution Take 3 mLs (2.5 mg total) by nebulization every 6 (six) hours as needed for wheezing or shortness of breath. 75 mL 5  . albuterol (VENTOLIN HFA) 108 (90 Base) MCG/ACT inhaler Inhale 2 puffs into the lungs every 6 (six) hours as needed for wheezing or shortness of breath. 18 g 5  . apixaban (ELIQUIS) 5 MG TABS tablet Take 1 tablet (5 mg total) by mouth 2 (two) times daily. 60 tablet 5  . carvedilol (COREG) 25 MG tablet Take 2 tablets (50 mg total) by mouth 2 (two) times daily with a meal. 120 tablet 6  . cephALEXin (KEFLEX) 500 MG capsule Take 1 capsule (500 mg total) by mouth 3 (three) times daily. 15 capsule 0  . digoxin (LANOXIN) 0.125 MG tablet Take 1 tablet (0.125 mg total) by mouth daily. 30 tablet 5  . Dulaglutide (TRULICITY) A999333 0000000 SOPN Inject 0.75 mg into the skin once a week. 4 pen 3  . gabapentin (NEURONTIN) 100 MG  capsule Take 1 capsule (100 mg total) by mouth 3 (three) times daily. 90 capsule 5  . glucose blood (CONTOUR NEXT TEST) test strip 2x daily 100 each 12  . hydrALAZINE (APRESOLINE) 50 MG tablet Take 1 tablet (50 mg total) by mouth 3 (three) times daily. 90 tablet 5  . insulin aspart protamine - aspart (NOVOLOG MIX 70/30 FLEXPEN) (70-30) 100 UNIT/ML FlexPen Inject 0.3 mLs (30 Units total) into the skin 2 (two) times daily. (Patient taking differently: Inject into the skin 2 (two) times daily. 20 units in am and 30 in the pm) 30 mL 6  . Insulin Pen Needle 32G X 4 MM MISC 1 Device by Does not apply route as directed. 100 each 6  . metFORMIN (GLUCOPHAGE) 1000 MG tablet Take 1 tablet (1,000 mg total) by mouth  daily with breakfast. 90 tablet 3  . pantoprazole (PROTONIX) 40 MG tablet Take 1 tablet (40 mg total) by mouth daily. 30 tablet 2  . potassium chloride SA (KLOR-CON) 20 MEQ tablet Take 1 tablet (20 mEq total) by mouth 2 (two) times daily. 60 tablet 3  . tizanidine (ZANAFLEX) 2 MG capsule Take 1 capsule (2 mg total) by mouth 2 (two) times daily as needed for muscle spasms. 15 capsule 0  . torsemide (DEMADEX) 20 MG tablet Take 1 tablet (20 mg total) by mouth 2 (two) times daily. 60 tablet 5  . triamcinolone cream (KENALOG) 0.1 % Apply to affected area one to times daily 45 g 0    Home: Home Living Family/patient expects to be discharged to:: Private residence Additional Comments: per chart and RN report, sister has been present and supportive. Will need to confirm home setup/PLOF  Functional History: Prior Function Comments: suspect pt independent PTA, will need to confirm  Functional Status:  Mobility: Bed Mobility Overal bed mobility: Needs Assistance Bed Mobility: Rolling, Sidelying to Sit, Sit to Supine Rolling: Max assist, +2 for physical assistance Sidelying to sit: Total assist, HOB elevated, +2 for physical assistance Supine to sit: Total assist, +2 for physical assistance Sit to  supine: Total assist, +2 for physical assistance General bed mobility comments: transitioning towards pt's R side. requires assist for LEs/trunk throughout transitions. Pt only able to tolerate sitting EOB for short period of time, with increased secretions, diaphoresis and restless/appearing uncomfortable while upright Transfers General transfer comment: deferred       ADL: ADL Overall ADL's : Needs assistance/impaired General ADL Comments: curently totalA  Cognition: Cognition Overall Cognitive Status: Difficult to assess Orientation Level: Intubated/Tracheostomy - Unable to assess Cognition Arousal/Alertness: Lethargic Behavior During Therapy: Flat affect Overall Cognitive Status: Difficult to assess General Comments: pt with almost no command follow, will initially open eyes with certain movements and becoming more awake/alert once upright, squeezing therapist's hand while seated EOB but unsure if pt following instruction or just reflex Difficult to assess due to: Intubated  Blood pressure (!) 162/110, pulse (!) 113, temperature 99.1 F (37.3 C), temperature source Oral, resp. rate 14, height 5\' 6"  (1.676 m), weight 100.8 kg, last menstrual period 08/21/2011, SpO2 100 %. Physical Exam  Nursing note and vitals reviewed. Constitutional: She appears well-developed and well-nourished.  Pt is younger appearing woman- sitting in recliner- daughter and sister at bedside; just was extubated 4 hours ago or so; on 4L O2; nonverbal but nodding head SOME yes and no- but kept falling asleep Vitals 135/85 Pulse 103; 99% were on monitor  HENT:  Head: Normocephalic and atraumatic.  Very sleepy- kept closing her eyes- did not see facial asymmetry on exam- wouldn't smile for me or stick out tongue-  cortrak in place- O2 by [place] 4L  Eyes:  Was able to look left, but didn't appear to follow me past midline to go right- no nystagmus seen grossly.   Neck: No tracheal deviation present.    Cardiovascular:  In atrial fibrillation; rate low 100s on exam so slightly tachycardic- no M/R/G heard  Respiratory: No stridor.  Somewhat coarse, like needs to cough- a little rattle with expiration- but good air movement- wasn't increasing breath sounds with exam, of note  GI:  Soft, protuberant; NT; ND?; hypoactive BS  Genitourinary:    Genitourinary Comments: purewick in place   Musculoskeletal:     Cervical back: Neck supple.     Comments: Wiggling L foot and would grab  my hand/fingers with L hand- was strong, but couldn't get her to do more muscle testing- when tickled her, no movement seen on R side at all, but not sure if sensation on R is intact  Neurological:  Don't think sensation on R is intact- didn't move LEFT side when tickled on R or did mild noxious stimuli on R Kept closing eyes- hard to get her to keep open- very sedated/sleepy Sensation intact on L side- it appears- is ticklish on L side  Skin: Skin is warm and dry.  Hair up in braids and bun on top of head Midline R upper arm- looks good; R forearm IV-looks good; L forearm IV looks good.  Psychiatric:  Sleepy/sedated    Results for orders placed or performed during the hospital encounter of 12/26/19 (from the past 24 hour(s))  Sodium     Status: None   Collection Time: 12/28/19  8:43 AM  Result Value Ref Range   Sodium 142 135 - 145 mmol/L  Glucose, capillary     Status: Abnormal   Collection Time: 12/28/19 11:10 AM  Result Value Ref Range   Glucose-Capillary 253 (H) 70 - 99 mg/dL  Glucose, capillary     Status: Abnormal   Collection Time: 12/28/19  3:29 PM  Result Value Ref Range   Glucose-Capillary 209 (H) 70 - 99 mg/dL  Magnesium     Status: None   Collection Time: 12/28/19  6:55 PM  Result Value Ref Range   Magnesium 2.1 1.7 - 2.4 mg/dL  Phosphorus     Status: None   Collection Time: 12/28/19  6:55 PM  Result Value Ref Range   Phosphorus 2.5 2.5 - 4.6 mg/dL  Sodium     Status: Abnormal    Collection Time: 12/28/19  6:55 PM  Result Value Ref Range   Sodium 148 (H) 135 - 145 mmol/L  Glucose, capillary     Status: Abnormal   Collection Time: 12/28/19  7:33 PM  Result Value Ref Range   Glucose-Capillary 187 (H) 70 - 99 mg/dL  Glucose, capillary     Status: Abnormal   Collection Time: 12/28/19 11:21 PM  Result Value Ref Range   Glucose-Capillary 261 (H) 70 - 99 mg/dL  Sodium     Status: Abnormal   Collection Time: 12/29/19  1:20 AM  Result Value Ref Range   Sodium 151 (H) 135 - 145 mmol/L  Glucose, capillary     Status: Abnormal   Collection Time: 12/29/19  3:37 AM  Result Value Ref Range   Glucose-Capillary 256 (H) 70 - 99 mg/dL  CBC     Status: None   Collection Time: 12/29/19  4:40 AM  Result Value Ref Range   WBC 9.3 4.0 - 10.5 K/uL   RBC 4.14 3.87 - 5.11 MIL/uL   Hemoglobin 12.2 12.0 - 15.0 g/dL   HCT 39.8 36.0 - 46.0 %   MCV 96.1 80.0 - 100.0 fL   MCH 29.5 26.0 - 34.0 pg   MCHC 30.7 30.0 - 36.0 g/dL   RDW 14.6 11.5 - 15.5 %   Platelets 193 150 - 400 K/uL   nRBC 0.0 0.0 - 0.2 %  Basic metabolic panel     Status: Abnormal   Collection Time: 12/29/19  4:40 AM  Result Value Ref Range   Sodium 153 (H) 135 - 145 mmol/L   Potassium 3.6 3.5 - 5.1 mmol/L   Chloride 118 (H) 98 - 111 mmol/L   CO2 24 22 - 32  mmol/L   Glucose, Bld 267 (H) 70 - 99 mg/dL   BUN 17 6 - 20 mg/dL   Creatinine, Ser 0.87 0.44 - 1.00 mg/dL   Calcium 8.5 (L) 8.9 - 10.3 mg/dL   GFR calc non Af Amer >60 >60 mL/min   GFR calc Af Amer >60 >60 mL/min   Anion gap 11 5 - 15  Sodium     Status: Abnormal   Collection Time: 12/29/19  6:30 AM  Result Value Ref Range   Sodium 154 (H) 135 - 145 mmol/L   CT HEAD WO CONTRAST  Result Date: 12/29/2019 CLINICAL DATA:  Stroke follow-up EXAM: CT HEAD WITHOUT CONTRAST TECHNIQUE: Contiguous axial images were obtained from the base of the skull through the vertex without intravenous contrast. COMPARISON:  12/28/2019 FINDINGS: Brain: Large anterior left MCA  territory infarct with associated cytotoxic edema and petechial hemorrhage. There is 8 mm of rightward midline shift, unchanged. Mass effect on the lateral ventricles is unchanged. No hydrocephalus or ventricular entrapment. Vascular: No abnormal hyperdensity of the major intracranial arteries or dural venous sinuses. No intracranial atherosclerosis. Skull: The visualized skull base, calvarium and extracranial soft tissues are normal. Sinuses/Orbits: No fluid levels or advanced mucosal thickening of the visualized paranasal sinuses. No mastoid or middle ear effusion. The orbits are normal. IMPRESSION: 1. Unchanged appearance of large anterior left MCA territory infarct with petechial hemorrhage. 2. Unchanged 8 mm of rightward midline shift. Electronically Signed   By: Ulyses Jarred M.D.   On: 12/29/2019 03:44   CT Head Wo Contrast  Result Date: 12/28/2019 CLINICAL DATA:  Stroke follow-up EXAM: CT HEAD WITHOUT CONTRAST TECHNIQUE: Contiguous axial images were obtained from the base of the skull through the vertex without intravenous contrast. COMPARISON:  Brain MRI from yesterday FINDINGS: Brain: Cytotoxic edema in the anterior left MCA division and basal ganglia. There is petechial hemorrhage better seen by prior MRI. No discrete hematoma. Swelling causes 7 mm of midline shift. No entrapment. Vascular: Dense appearance of the left MCA which may be related to adjacent edematous brain. Skull: Negative Sinuses/Orbits: Negative IMPRESSION: Acute left MCA territory infarct with petechial hemorrhage. No evident progression from brain MRI yesterday. Midline shift measures 7 mm. Electronically Signed   By: Monte Fantasia M.D.   On: 12/28/2019 04:29   MR BRAIN WO CONTRAST  Result Date: 12/27/2019 CLINICAL DATA:  Stroke follow-up; status post left MCA thrombectomy EXAM: MRI HEAD WITHOUT CONTRAST TECHNIQUE: Multiplanar, multiecho pulse sequences of the brain and surrounding structures were obtained without intravenous  contrast. COMPARISON:  Head CT Dec 26, 2019 FINDINGS: Brain: Large area of restricted diffusion involving the left basal ganglia, insula, anterior temporal lobe including amygdala, inferior frontal lobe with cortical restricted diffusion in the lateral frontal and parietal regions, consistent with evolving left MCA territory infarct. There is susceptibility artifact along the cortex and basal ganglia, consistent with hemorrhagic transformation. There is mass effect with effacement of the regional sulci and partial effacement of the left lateral ventricle with a 6 mm rightward midline shift. There is no hydrocephalus. Remainder of the brain parenchyma is maintained. Vascular: Normal flow voids noting a 3 mm left MCA bifurcation aneurysm and a 7 mm paraclinoid right ICA aneurysm. Skull and upper cervical spine: Normal marrow signal. Sinuses/Orbits: Mild mucosal thickening of the ethmoid cells. The orbits are maintained. Other: Endotracheal tube noted. IMPRESSION: 1. Evolving left MCA territory infarct with hemorrhagic transformation. Mass effect with 6 mm rightward midline shift. 2. Normal flow voids noting a 3 mm  left MCA bifurcation aneurysm and a 7 mm paraclinoid right ICA aneurysm. Electronically Signed   By: Pedro Earls M.D.   On: 12/27/2019 15:29   ECHOCARDIOGRAM COMPLETE  Result Date: 12/27/2019    ECHOCARDIOGRAM REPORT   Patient Name:   [facility] Date of Exam: 12/27/2019 Medical Rec #:  [id number]      Height:       66.0 in Accession #:    [id number]     Weight:       218.0 lb Date of Birth:  1966-07-11     BSA:          2.075 m Patient Age:    71 years       BP:           113/65 mmHg Patient Gender: F              HR:           60 bpm. Exam Location:  Inpatient Procedure: 2D Echo, Cardiac Doppler and Color Doppler Indications:    Stroke  History:        Patient has prior history of Echocardiogram examinations, most                 recent 05/11/2019. CHF, COPD, Arrythmias:Atrial  Fibrillation;                 Risk Factors:Hypertension and Diabetes. Nicotine abuse,Substance                 abuse.  Sonographer:    Alvino Chapel RCS Referring Phys: [phone] ERIC LINDZEN  Sonographer Comments: Patient on mechanical ventilator during echo exam IMPRESSIONS  1. Left ventricular ejection fraction, by estimation, is 30 to 35%. The left ventricle has moderately decreased function. The left ventricle demonstrates global hypokinesis. The left ventricular internal cavity size was mildly to moderately dilated. There is moderate asymmetric left ventricular hypertrophy. Left ventricular diastolic function could not be evaluated.  2. Right ventricular systolic function is normal. The right ventricular size is normal.  3. Left atrial size was severely dilated.  4. Right atrial size was severely dilated.  5. The mitral valve is grossly normal. Trivial mitral valve regurgitation. No evidence of mitral stenosis.  6. The aortic valve is grossly normal. Aortic valve regurgitation is not visualized. Moderate aortic valve stenosis. FINDINGS  Left Ventricle: Left ventricular ejection fraction, by estimation, is 30 to 35%. The left ventricle has moderately decreased function. The left ventricle demonstrates global hypokinesis. The left ventricular internal cavity size was mildly to moderately  dilated. There is moderate asymmetric left ventricular hypertrophy. Left ventricular diastolic function could not be evaluated due to atrial fibrillation. Left ventricular diastolic function could not be evaluated. Right Ventricle: The right ventricular size is normal. No increase in right ventricular wall thickness. Right ventricular systolic function is normal. Left Atrium: Left atrial size was severely dilated. Right Atrium: Right atrial size was severely dilated. Pericardium: There is no evidence of pericardial effusion. Mitral Valve: The mitral valve is grossly normal. Trivial mitral valve regurgitation. No evidence of mitral  valve stenosis. Tricuspid Valve: The tricuspid valve is normal in structure. Tricuspid valve regurgitation is trivial. No evidence of tricuspid stenosis. Aortic Valve: The aortic valve is grossly normal. Aortic valve regurgitation is not visualized. Moderate aortic stenosis is present. Aortic valve mean gradient measures 12.0 mmHg. Aortic valve peak gradient measures 19.1 mmHg. Aortic valve area, by VTI measures 1.20 cm. Pulmonic Valve: The pulmonic valve was  normal in structure. Pulmonic valve regurgitation is not visualized. No evidence of pulmonic stenosis. Aorta: The aortic root and ascending aorta are structurally normal, with no evidence of dilitation. IAS/Shunts: The atrial septum is grossly normal.  LEFT VENTRICLE PLAX 2D LVIDd:         4.20 cm LVIDs:         3.20 cm LV PW:         1.30 cm LV IVS:        1.50 cm LVOT diam:     2.00 cm LV SV:         57 LV SV Index:   28 LVOT Area:     3.14 cm  LV Volumes (MOD) LV vol d, MOD A2C: 68.2 ml LV vol d, MOD A4C: 100.0 ml LV vol s, MOD A2C: 26.9 ml LV vol s, MOD A4C: 42.7 ml LV SV MOD A2C:     41.3 ml LV SV MOD A4C:     100.0 ml LV SV MOD BP:      50.8 ml RIGHT VENTRICLE RV S prime:     7.18 cm/s TAPSE (M-mode): 1.9 cm LEFT ATRIUM              Index LA diam:        3.50 cm  1.69 cm/m LA Vol (A2C):   113.0 ml 54.47 ml/m LA Vol (A4C):   133.0 ml 64.11 ml/m LA Biplane Vol: 123.0 ml 59.29 ml/m  AORTIC VALVE AV Area (Vmax):    1.19 cm AV Area (Vmean):   1.27 cm AV Area (VTI):     1.20 cm AV Vmax:           218.50 cm/s AV Vmean:          158.000 cm/s AV VTI:            0.481 m AV Peak Grad:      19.1 mmHg AV Mean Grad:      12.0 mmHg LVOT Vmax:         82.50 cm/s LVOT Vmean:        64.100 cm/s LVOT VTI:          0.183 m LVOT/AV VTI ratio: 0.38  AORTA Ao Root diam: 3.20 cm MITRAL VALVE MV Area (PHT): 3.53 cm     SHUNTS MV Decel Time: 215 msec     Systemic VTI:  0.18 m MV E velocity: 133.00 cm/s  Systemic Diam: 2.00 cm Mertie Moores MD Electronically signed by  Mertie Moores MD Signature Date/Time: 12/27/2019/2:34:44 PM    Final     Assessment/Plan: Diagnosis: L MCA occlusion s/p thrombectomy with R hemiplegia, dysphagia and likely some aphasia 1. Does the need for close, 24 hr/day medical supervision in concert with the patient's rehab needs make it unreasonable for this patient to be served in a less intensive setting? Yes 2. Co-Morbidities requiring supervision/potential complications: DM with 123456 of 11.6, dysphagia- with cortrak, gout, fatty liver, HTN, HLD, substance abuse 3. Due to bladder management, bowel management, safety, skin/wound care, disease management, medication administration, pain management and patient education, does the patient require 24 hr/day rehab nursing? Yes 4. Does the patient require coordinated care of a physician, rehab nurse, therapy disciplines of PT, OT, and SLP to address physical and functional deficits in the context of the above medical diagnosis(es)? Yes Addressing deficits in the following areas: balance, endurance, locomotion, strength, transferring, bowel/bladder control, bathing, dressing, feeding, grooming, toileting, cognition, speech, language, swallowing and psychosocial support  5. Can the patient actively participate in an intensive therapy program of at least 3 hrs of therapy per day at least 5 days per week? Yes 6. The potential for patient to make measurable gains while on inpatient rehab is good and fair 7. Anticipated functional outcomes upon discharge from inpatient rehab are min assist  with PT, min assist with OT, min assist with SLP. 8. Estimated rehab length of stay to reach the above functional goals is: 20-28 days 9. Anticipated discharge destination: Home 10. Overall Rehab/Functional Prognosis: good and fair  RECOMMENDATIONS: This patient's condition is appropriate for continued rehabilitative care in the following setting: CIR Patient has agreed to participate in recommended program.  Potentially Note that insurance prior authorization may be required for reimbursement for recommended care.  Comment: 1'Pt s/p L MCA occlusion- just extubated a few hours ago- obviously not ready yet for CIR_ likely early-mid next week.  2. Suggest something to help wake pt up- either Amantadine or Ritalin- likely Amantadine since has Afib/faster rate currently- would do 100 mg daily x 3-4 days then 200 mg daily- for initiation and wakefulness.  3. Monitor bowels- LBM Tuesday- that I could see- might need to add PO/Cortrak bowel meds 4. Suggest prevalon boots to be on RLE when in bed- to prevent skin breakdown and slow PF contracture.  5. Of note, her family admitted she took her meds daily, but "diet was not ideal"- still "ate what she wanted".  6. Will con't to monitor remotely 7. Thank you for this consult.     Lavon Paganini Angiulli, PA-C 12/29/2019    I have personally performed a face to face diagnostic evaluation of this patient and formulated the key components of the plan.  Additionally, I have personally reviewed laboratory data, imaging studies, as well as relevant notes and concur with the physician assistant's documentation above.

## 2019-12-29 NOTE — Progress Notes (Signed)
NAME:  Robin [name], MRN:  [id number], DOB:  29-Apr-1966, LOS: 2 ADMISSION DATE:  12/26/2019, CONSULTATION DATE:  12/29/19 REFERRING MD:  Cheral Marker, CHIEF COMPLAINT:  Acute CVA   Brief History   54 year old female admitted with acute MCA CVA and left ICA LVO, taken emergently for thrombectomy and left intubated post procedure.  PCCM consulted for vent management.  History of present illness   54 year old female with a history of diabetes pretension, A. fib on Eliquis, heart failure, substance abuse who presented after a fall with right-sided weakness as a code stroke.  Found to have left MCA CVA and left ICA occlusion.  Taken emergently for thrombectomy and transferred to the ICU intubated.  PCCM consulted for vent management  Past Medical History   has a past medical history of Allergy, Anemia, Arthritis, CHF (congestive heart failure) ([place]), Chronic abdominal pain, Cocaine abuse ([place]), COPD (chronic obstructive pulmonary disease) ([place]), Essential hypertension, benign, Fatty liver, GERD (gastroesophageal reflux disease), Gout (2016), Normal coronary arteries, Ovarian cyst, and Type 2 diabetes mellitus ([place]).   Significant Hospital Events   5/24 Admit to neurology  Consults:  PCCM  Procedures:  5/24 ETT >  Significant Diagnostic Tests:  5/23 CT head >evolving hypodensity involving the left basal ganglia, left insula, and adjacent left temporal lobe, which could reflect an evolving left MCA territory infarct. Additionally, there is question of asymmetric hyperdensity at the left ICA terminus, which could reflect intraluminal thrombus/LVO MRI 5/24 > Evolving left MCA territory infarct with hemorrhagic transformation. Mass effect with 6 mm rightward midline shift. 7 mm paraclinoid right ICA aneurysm. Echo 5/24 > LVEF 35-45%. LV mild dilation. Global HK. Mod asymmetric LV hypertrophy. Both atria severely dilated.  CT head 5/24 > Acute left MCA territory infarct with petechial hemorrhage. No  evident progression from brain MRI yesterday. Midline shift measures 7 mm.  Micro Data:  5/24 Sars-Cov-2>>negative 5/24 UC>>  Antimicrobials:    Interim history/subjective:  Sedation off.  Following basic commands on left.  Plegic on right.  Objective   Blood pressure (!) 162/110, pulse (!) 113, temperature 99.1 F (37.3 C), temperature source Oral, resp. rate 14, height 5\' 6"  (1.676 m), weight 100.8 kg, last menstrual period 08/21/2011, SpO2 100 %.    Vent Mode: PSV;CPAP FiO2 (%):  [40 %] 40 % Set Rate:  [16 bmp] 16 bmp Vt Set:  [470 mL] 470 mL PEEP:  [5 cmH20] 5 cmH20 Pressure Support:  [5 cmH20] 5 cmH20 Plateau Pressure:  [15 cmH20] 15 cmH20   Intake/Output Summary (Last 24 hours) at 12/29/2019 0826 Last data filed at 12/29/2019 0800 Gross per 24 hour  Intake 2738.48 ml  Output 3100 ml  Net -361.52 ml   Filed Weights   12/26/19 2243 12/28/19 0500  Weight: 98.9 kg 100.8 kg    General: adult female, obese, on vent HEENT: MM pink/moist, ETT Neuro: Sedation off.  Opens eyes to voice. Moves L to command. No movement on R.  CV: IRIR, no M/R/G. PULM: Clear, no wheeze. GI: Soft, NT, ND. Extremities: No acute deformity or edema. Skin: no rashes or lesions.   Assessment & Plan:   Acute left MCA ischemic CVA with left ICA LVO s/p thrombectomy Management per neurology, SBP goal 120-130 R ICA aneurysm  - Per stroke service.  - Continue hypertonic saline 53mL/Hr.  Acute hypoxemic respirtory failure: in the post operative setting requiring sedation.  COPD without acute exacerbation.  - Continue SBT trial today, if does well can consider extubation as repeat  CT is stable. - Duonebs. - VAP bundle.  Atrial fibrillation HTN Chronic HFrEF: LVEF 35% - Eliquis holding, may need different agent as she has failed eliquis at this point, assuming she is medically compliant with it.  - SQ heparin start today. Wait until tomorrow 5/27 to start systemic AC per stroke.  - Continue  home coreg, digoxin, torsemide.  Type 2 diabetes - Holding metformin. - CBG monitoring and SSI. Increase scale.  - Add glargine 10 units.   Nutrition  - Tube feeds.  Best practice:  Diet: N.p.o. Pain/Anxiety/Delirium protocol (if indicated): Propofol, fentanyl VAP protocol (if indicated): HOB 30 degrees, suction as needed DVT prophylaxis: SCDs GI prophylaxis: Protonix Glucose control: SSI Mobility: Bedrest Code Status: Full code Family Communication: updated bedside Disposition: ICU   CC time: 35 min.   Montey Hora, [facility] 12/29/2019, 8:38 AM

## 2019-12-29 NOTE — Progress Notes (Signed)
[facility] Progress Note Patient Name: Robin [name] DOB: 1965-09-15 MRN: [id number]   Date of Service  12/29/2019  HPI/Events of Note  Pt is s/p CVA with SBP goal of < 180 mmHg, Pt outside parameters despite 7 doses of PRN Labetalol.  eICU Interventions  PRN Hydralazine ordered.        Kerry Kass Ogan 12/29/2019, 12:21 AM

## 2019-12-30 LAB — GLUCOSE, CAPILLARY
Glucose-Capillary: 258 mg/dL — ABNORMAL HIGH (ref 70–99)
Glucose-Capillary: 175 mg/dL — ABNORMAL HIGH (ref 70–99)
Glucose-Capillary: 179 mg/dL — ABNORMAL HIGH (ref 70–99)
Glucose-Capillary: 271 mg/dL — ABNORMAL HIGH (ref 70–99)
Glucose-Capillary: 219 mg/dL — ABNORMAL HIGH (ref 70–99)
Glucose-Capillary: 192 mg/dL — ABNORMAL HIGH (ref 70–99)
Glucose-Capillary: 173 mg/dL — ABNORMAL HIGH (ref 70–99)

## 2019-12-30 LAB — BASIC METABOLIC PANEL
Anion gap: 9 (ref 5–15)
BUN: 30 mg/dL — ABNORMAL HIGH (ref 6–20)
CO2: 28 mmol/L (ref 22–32)
Calcium: 9.7 mg/dL (ref 8.9–10.3)
Chloride: 120 mmol/L — ABNORMAL HIGH (ref 98–111)
Creatinine, Ser: 1.11 mg/dL — ABNORMAL HIGH (ref 0.44–1.00)
GFR calc Af Amer: >60 mL/min (ref >60)
GFR calc non Af Amer: 57 mL/min — ABNORMAL LOW (ref >60)
Glucose, Bld: 182 mg/dL — ABNORMAL HIGH (ref 70–99)
Potassium: 3.5 mmol/L (ref 3.5–5.1)
Sodium: 157 mmol/L — ABNORMAL HIGH (ref 135–145)

## 2019-12-30 LAB — POCT I-STAT 7, (LYTES, BLD GAS, ICA,H+H)
Acid-base deficit: 3 mmol/L — ABNORMAL HIGH (ref 0.0–2.0)
Bicarbonate: 22.8 mmol/L (ref 20.0–28.0)
Calcium, Ion: 1.21 mmol/L (ref 1.15–1.40)
HCT: 42 % (ref 36.0–46.0)
Hemoglobin: 14.3 g/dL (ref 12.0–15.0)
O2 Saturation: 95 %
Patient temperature: 97.6
Potassium: 4.9 mmol/L (ref 3.5–5.1)
Sodium: 137 mmol/L (ref 135–145)
TCO2: 24 mmol/L (ref 22–32)
pCO2 arterial: 43.2 mmHg (ref 32.0–48.0)
pH, Arterial: 7.328 — ABNORMAL LOW (ref 7.350–7.450)
pO2, Arterial: 77 mmHg — ABNORMAL LOW (ref 83.0–108.0)

## 2019-12-30 LAB — CBC
HCT: 39.5 % (ref 36.0–46.0)
Hemoglobin: 12 g/dL (ref 12.0–15.0)
MCH: 29.5 pg (ref 26.0–34.0)
MCHC: 30.4 g/dL (ref 30.0–36.0)
MCV: 97.1 fL (ref 80.0–100.0)
Platelets: 209 10*3/uL (ref 150–400)
RBC: 4.07 MIL/uL (ref 3.87–5.11)
RDW: 15 % (ref 11.5–15.5)
WBC: 9.5 10*3/uL (ref 4.0–10.5)
nRBC: 0 % (ref 0.0–0.2)

## 2019-12-30 MED ORDER — CHLORHEXIDINE GLUCONATE CLOTH 2 % EX PADS
6.0000 | MEDICATED_PAD | Freq: Every day | CUTANEOUS | Status: DC
  Administered 2019-12-31 – 2020-01-05 (×6): 6 via TOPICAL

## 2019-12-30 MED ORDER — INSULIN ASPART 100 UNIT/ML ~~LOC~~ SOLN
0.0000 [IU] | SUBCUTANEOUS | Status: DC
  Administered 2019-12-30: 11 [IU] via SUBCUTANEOUS
  Administered 2019-12-30 (×3): 4 [IU] via SUBCUTANEOUS
  Administered 2019-12-30: 7 [IU] via SUBCUTANEOUS
  Administered 2019-12-31 (×2): 4 [IU] via SUBCUTANEOUS
  Administered 2019-12-31 (×2): 3 [IU] via SUBCUTANEOUS
  Administered 2019-12-31 (×2): 7 [IU] via SUBCUTANEOUS
  Administered 2020-01-01: 3 [IU] via SUBCUTANEOUS
  Administered 2020-01-01: 11 [IU] via SUBCUTANEOUS
  Administered 2020-01-01: 4 [IU] via SUBCUTANEOUS
  Administered 2020-01-02 (×3): 7 [IU] via SUBCUTANEOUS
  Administered 2020-01-02: 3 [IU] via SUBCUTANEOUS
  Administered 2020-01-02 – 2020-01-03 (×3): 4 [IU] via SUBCUTANEOUS
  Administered 2020-01-03 (×2): 3 [IU] via SUBCUTANEOUS
  Administered 2020-01-03: 11 [IU] via SUBCUTANEOUS
  Administered 2020-01-04 (×2): 4 [IU] via SUBCUTANEOUS
  Administered 2020-01-04: 11 [IU] via SUBCUTANEOUS
  Administered 2020-01-04 – 2020-01-05 (×3): 7 [IU] via SUBCUTANEOUS
  Administered 2020-01-05 (×2): 4 [IU] via SUBCUTANEOUS
  Administered 2020-01-05: 11 [IU] via SUBCUTANEOUS
  Administered 2020-01-05: 7 [IU] via SUBCUTANEOUS

## 2019-12-30 MED ORDER — VITAL HIGH PROTEIN PO LIQD: 1000.0000 mL | ORAL | Status: DC

## 2019-12-30 MED ORDER — ORAL CARE MOUTH RINSE
15.0000 mL | Freq: Two times a day (BID) | OROMUCOSAL | Status: DC
  Administered 2019-12-30 – 2020-01-05 (×10): 15 mL via OROMUCOSAL

## 2019-12-30 MED ORDER — INSULIN GLARGINE 100 UNIT/ML ~~LOC~~ SOLN
60.0000 [IU] | Freq: Every day | SUBCUTANEOUS | Status: DC
  Administered 2019-12-30 – 2020-01-04 (×6): 60 [IU] via SUBCUTANEOUS
  Filled 2019-12-30 (×7): qty 0.6

## 2019-12-30 MED ORDER — CHLORHEXIDINE GLUCONATE 0.12 % MT SOLN
15.0000 mL | Freq: Two times a day (BID) | OROMUCOSAL | Status: DC
  Administered 2019-12-30 – 2020-01-05 (×12): 15 mL via OROMUCOSAL
  Filled 2019-12-30 (×10): qty 15

## 2019-12-30 MED ORDER — VITAL HIGH PROTEIN PO LIQD
1000.0000 mL | ORAL | Status: DC
Start: 2019-12-30 — End: 2019-12-30

## 2019-12-30 MED ORDER — PRO-STAT SUGAR FREE PO LIQD
30.0000 mL | Freq: Every day | ORAL | Status: DC
  Administered 2019-12-31 – 2020-01-05 (×6): 30 mL
  Filled 2019-12-30 (×7): qty 30

## 2019-12-30 MED ORDER — INSULIN ASPART 100 UNIT/ML ~~LOC~~ SOLN
2.0000 [IU] | SUBCUTANEOUS | Status: DC
  Administered 2019-12-30 – 2020-01-05 (×37): 2 [IU] via SUBCUTANEOUS

## 2019-12-30 MED ORDER — GLUCERNA 1.2 CAL PO LIQD
1000.0000 mL | ORAL | Status: DC
  Administered 2019-12-31 – 2020-01-04 (×4): 1000 mL
  Filled 2019-12-30 (×10): qty 1000

## 2019-12-30 MED ORDER — WHITE PETROLATUM EX OINT
TOPICAL_OINTMENT | CUTANEOUS | Status: AC
  Filled 2019-12-30: qty 28.35

## 2019-12-30 NOTE — Progress Notes (Signed)
Physical Therapy Treatment Patient Details Name: Robin [name] MRN: [id number] DOB: [birth year] Today's Date: 12/30/2019    History of Present Illness 54 y.o. female with a history of DM, HTN, atrial fibrillation on Eliquis, anemia, CHF, COPD and polysubstance abuse, presenting initially to [facility] ED after being found unresponsive at home after family heard her fall. Pt transferred/admitted to [facility] for acute left MCA stroke and L ICA occlusion, is now s/p mechanical thrombectomy on 5/24, pt kept intubated post procedure.     PT Comments    Patient able to tolerate up to chair via squat pivot to L with +2 A. Daughter in the room and she is engaged in pt's care.  Educated on sitting to pt's R at times and pt is not tracking past midline.  She was able to assist a lot with L side, still flaccid on R.     Follow Up Recommendations  CIR;Supervision/Assistance - 24 hour     Equipment Recommendations  Hospital bed;Wheelchair (measurements PT);Wheelchair cushion (measurements PT);3in1 (PT)    Recommendations for Other Services       Precautions / Restrictions Precautions Precautions: Fall Precaution Comments: R hemi    Mobility  Bed Mobility Overal bed mobility: Needs Assistance Bed Mobility: Rolling;Sidelying to Sit Rolling: Mod assist;+2 for physical assistance Sidelying to sit: Max assist;+2 for physical assistance       General bed mobility comments: assist for hygiene in sidelying as urine soaked bed; assist for trunk upright and LE's for positioning  Transfers Overall transfer level: Needs assistance   Transfers: Squat Pivot Transfers     Squat pivot transfers: Max assist;+2 physical assistance     General transfer comment: pivot to L to recliner with drop arm and +2 for lifting and lowering; daughter in room and attempting to help  Ambulation/Gait                 Stairs             Wheelchair Mobility    Modified Rankin (Stroke Patients  Only) Modified Rankin (Stroke Patients Only) Pre-Morbid Rankin Score: No symptoms Modified Rankin: Severe disability     Balance Overall balance assessment: Needs assistance Sitting-balance support: Feet supported Sitting balance-Leahy Scale: Poor Sitting balance - Comments: mild lean back and to R min to mod A for balance sitting                                    Cognition Arousal/Alertness: Awake/alert Behavior During Therapy: Flat affect Overall Cognitive Status: Difficult to assess                                 General Comments: eager for up to chair and needing cues for safety/waiting for assist and to sit once pivoting to chair      Exercises      General Comments General comments (skin integrity, edema, etc.): on 4L O2  up OOB VSS      Pertinent Vitals/Pain Pain Assessment: Faces Faces Pain Scale: No hurt    Home Living                      Prior Function            PT Goals (current goals can now be found in the care plan section) Progress towards PT goals:  Progressing toward goals    Frequency    Min 4X/week      PT Plan Current plan remains appropriate    Co-evaluation              AM-PAC PT "6 Clicks" Mobility   Outcome Measure  Help needed turning from your back to your side while in a flat bed without using bedrails?: Total Help needed moving from lying on your back to sitting on the side of a flat bed without using bedrails?: Total Help needed moving to and from a bed to a chair (including a wheelchair)?: Total Help needed standing up from a chair using your arms (e.g., wheelchair or bedside chair)?: Total Help needed to walk in hospital room?: Total Help needed climbing 3-5 steps with a railing? : Total 6 Click Score: 6    End of Session Equipment Utilized During Treatment: Gait belt;Oxygen Activity Tolerance: Patient tolerated treatment well Patient left: in chair;with call bell/phone  within reach;with family/visitor present   PT Visit Diagnosis: Other abnormalities of gait and mobility (R26.89);Hemiplegia and hemiparesis;Other symptoms and signs involving the nervous system (R29.898) Hemiplegia - Right/Left: Right Hemiplegia - dominant/non-dominant: Dominant Hemiplegia - caused by: Cerebral infarction     Time: 1110-1148 PT Time Calculation (min) (ACUTE ONLY): 38 min  Charges:  $Therapeutic Activity: 23-37 mins                     Magda Kiel, [place] [phone] 12/30/2019    Reginia Naas 12/30/2019, 5:28 PM

## 2019-12-30 NOTE — Procedures (Signed)
Extubation Procedure Note  Patient Details:   Name: Robin [name] DOB: [birth year] MRN: [id number]   Airway Documentation:    Vent end date: 12/30/19 Vent end time: 1914   Evaluation  O2 sats: stable throughout Complications: No apparent complications Patient did tolerate procedure well. Bilateral Breath Sounds: Clear, Diminished   Yes   Pt extubated per MD order to 4L [place]. RN and RT at bedside, cuff leak heard prior to extubation. Pt is unable to follow commands to speak name or cough when asked to. RN is aware. HR 113, RR 18 and SATs are 97%. RT will continue to monitor.  Esperanza Sheets T 12/30/2019, 9:18 AM

## 2019-12-30 NOTE — Progress Notes (Signed)
Nutrition Follow-up  DOCUMENTATION CODES:   Obesity unspecified  INTERVENTION:   Transition to  Glucerna 1.2 @ 60 ml/hr (1440 ml/day) via NG tube 30 ml Prostat daily  Provides: 1828 kcal, 101 grams protein, and 1180 ml free water.    NUTRITION DIAGNOSIS:   Inadequate oral intake related to inability to eat as evidenced by NPO status. Ongoing.   GOAL:   Patient will meet greater than or equal to 90% of their needs Meeting with TF.   MONITOR:   TF tolerance, Vent status  REASON FOR ASSESSMENT:   Consult, Ventilator Enteral/tube feeding initiation and management  ASSESSMENT:   Pt with PMH Of CHF, anemia, cocaine abuse, COPD, HTN, fatty liver, GERD, DM who was admitted for L MCA CVA and L ICA occlusion s/p thrombectomy. Noted positive for cocaine on admission.   Plan for swallow eval   5/26 cortrak placed; tip gastric 5/27 extubated  Medications reviewed and include: colace, SSI, 2 units novolog every 4 hours, 40 units lantus daily, MVI, miralax Labs reviewed:  CBG's: [phone] Lab Results  Component Value Date   HGBA1C 11.6 (H) 12/27/2019    TF: Vital High Protein @ 40 ml/hr with 30 ml Prostat TID Provides: 1260 kcal and 129 grams protein.   Diet Order:   Diet Order            Diet NPO time specified  Diet effective now              EDUCATION NEEDS:   No education needs have been identified at this time  Skin:  Skin Assessment: Reviewed RN Assessment  Last BM:  5/26  Height:   Ht Readings from Last 1 Encounters:  12/26/19 5\' 6"  (1.676 m)    Weight:   Wt Readings from Last 1 Encounters:  12/30/19 101 kg    Ideal Body Weight:  59 kg  BMI:  Body mass index is 35.94 kg/m.  Estimated Nutritional Needs:   Kcal:  1800-2000  Protein:  100-115 grams  Fluid:  >1.8 L/day  Lockie Pares., RD, LDN, CNSC See AMiON for contact information

## 2019-12-30 NOTE — Progress Notes (Addendum)
STROKE TEAM PROGRESS NOTE   INTERVAL HISTORY Patient remains on ventilatory support for respiratory failure and neurological exam remains unchanged patient aphasic with dense right hemiplegia.  She follows occasional commands on the left side.  Vital signs are stable.  She is currently on spontaneous breathing trial and seems to be tolerating it well though she has mild tachycardia.  She is off hypertonic saline but serum sodium is increased to 158 today  Vitals:   12/30/19 0600 12/30/19 0700 12/30/19 0725 12/30/19 0800  BP: (!) 143/105 (!) 152/94    Pulse: 98 99 90   Resp: 16 16 16    Temp:    98.4 F (36.9 C)  TempSrc:    Axillary  SpO2: 100% 100% 100%   Weight:      Height:       CBC:  Recent Labs  Lab 12/26/19 2254 12/26/19 2254 12/27/19 0305 12/27/19 0305 12/29/19 0440 12/30/19 0617  WBC 8.1   < > 7.9   < > 9.3 9.5  NEUTROABS 5.1  --  6.1  --   --   --   HGB 13.5   < > 13.3   < > 12.2 12.0  HCT 42.0   < > 41.6   < > 39.8 39.5  MCV 91.7   < > 91.6   < > 96.1 97.1  PLT 221   < > 217   < > 193 209   < > = values in this interval not displayed.   Basic Metabolic Panel:  Recent Labs  Lab 12/28/19 0443 12/28/19 0843 12/28/19 1855 12/29/19 0120 12/29/19 0440 12/29/19 0630 12/29/19 1857 12/30/19 0617  NA 141   < > 148*   < > 153*   < > 153* 157*  K 4.0  --   --   --  3.6  --   --  3.5  CL 107  --   --   --  118*  --   --  120*  CO2 25  --   --   --  24  --   --  28  GLUCOSE 160*  --   --   --  267*  --   --  182*  BUN 18  --   --   --  17  --   --  30*  CREATININE 1.07*  --   --   --  0.87  --   --  1.11*  CALCIUM 8.5*  --   --   --  8.5*  --   --  9.7  MG 2.2  --  2.1  --   --   --   --   --   PHOS 3.3  --  2.5  --   --   --   --   --    < > = values in this interval not displayed.   Lipid Panel:     Component Value Date/Time   CHOL 121 12/27/2019 0305   TRIG 128 12/27/2019 0305   HDL 27 (L) 12/27/2019 0305   CHOLHDL 4.5 12/27/2019 0305   VLDL 26  12/27/2019 0305   LDLCALC 68 12/27/2019 0305   HgbA1c:  Lab Results  Component Value Date   HGBA1C 11.6 (H) 12/27/2019   Urine Drug Screen:     Component Value Date/Time   LABOPIA NONE DETECTED 12/26/2019 2249   COCAINSCRNUR POSITIVE (A) 12/26/2019 2249   LABBENZ NONE DETECTED 12/26/2019 2249   AMPHETMU NONE DETECTED  12/26/2019 2249   THCU NONE DETECTED 12/26/2019 2249   LABBARB NONE DETECTED 12/26/2019 2249    Alcohol Level     Component Value Date/Time   ETH <10 12/26/2019 2255   IMAGING past 24 hours No results found.  PHYSICAL EXAM    Obese middle-aged African-American lady who is intubated and not sedated. . Afebrile. Head is nontraumatic. Neck is supple without bruit.    Cardiac exam no murmur or gallop. Lungs are clear to auscultation. Distal pulses are well felt. Neurological Exam :  Patient is awake with eyes open but is globally aphasic and does   follow occasional left body commands.  Slight left gaze preference.  Doll's eye movements are sluggish.  Pupils equal reactive.  Fundi could not be visualized.  Spontaneous left upper and lower extremity movements against gravity..  Right hemiplegia with right upper extremity flaccidity and hypotonia with no withdrawal even to painful stimuli.  Trace withdrawal in the right lower extremity to painful stimuli.  Deep tendon flexes are depressed on the right and present on the left.  Right plantar equivocal left downgoing.  Gait not tested.  ASSESSMENT/PLAN Robin [name] is a 54 y.o. female with history of DM, HTN, atrial fibrillation on Eliquis, anemia, CHF, COPD and polysubstance abuse presenting to [facility] with R hemiparesis, unresponsive. Found to have L MCA infarct transferred to [facility] for thrombectomy .   Stroke:   L MCA infarct w/ petechial hemorrhage embolic in setting of cocaine use with AF on eliquis and cardiomyopathy  Code Stroke C T head evolving hypodensity L basal ganglia,  L insula, L temporal lobe. ? Hyperdensity L ICA terminus. ASPECTS 6    CTA head & neck L ICA terminus occlusion w/ distal reconstitution mid and idstal L M1 w/ attenuated flow distally. Mild L ICA bifurcation atherosclerosis. Short severe proximal R V1 stenosis. 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   CT perfusion 24cc core infarct deep and subcortical white matter anterior L frontal lobe. 214cc penumbra.  Cerebral angio L ICA occlusion w/ no flow in ACA or MCA. TICI3 revascularization ACA and MCA. Earleen Newport)  Post IR CT no hemorrhage, no stain   MRI large patchy left MCA infarct with hemorrhagic petechial transformation.  Repeat CT 5/25 L MCA infarct w/ petechial hemorrhage, now w/ 12mm midline shift   Repeat CT 5/26 unchanged hemorrhage and shift  Repeat CT 5/28 pending   2D Echo decreased ejection fraction 30 to 35%.  No definite clot.  LDL 68  HgbA1c 11.6  Heparin 5000 units sq tid for VTE prophylaxis  Eliquis (apixaban) daily prior to admission, now on aspirin 81 mg daily. consider IV heparin 5/28 as transition to oral anticoagulants once cerebral edema stabilized  Therapy recommendations:  CIR  Disposition:  pending   Acute Respiratory Failure  Intubated for IR, left intubated for airway protection   CXR streaky opacities favoring atx yest, today w/ post op atx  For extubation today  CCM on board  Cytotoxic Cerebral Edema  70mm midline shift seen on CT 5/25 am  Started 3% hypertonic saline protocol   Na 136-141-142-151-153-154-154-153-157  Allow Na to drift down  Do not resume 3%  Check Na daily  Repeat CT 5/26 unchanged hemorrhage and shift  Repeat CT 5/28 pending    Atrial Fibrillation w/ RVR  Home anticoagulation:  Eliquis (apixaban) daily   On Coreg 50 bid, digoxin 0.125 daily PTA - resumed in hospital  On diltiazem gtt -  off  Cardiomyopathy  2D Echo decreased ejection fraction 30 to 35%.  No definite clot.  Hypertension  Home meds:   Hydralazine 50 tid . BP goal < 180 . On coreg 50 bid, dig 0.125, torsemide 20, prn labetalol . Long-term BP goal normotensive  Diabetes type II Uncontrolled  Home meds:  trulicity [id number] weekly, 0000000 30u bid, metformin 1000  HgbA1c 11.6, goal < 7.0  CBGs Recent Labs    12/29/19 2344 12/30/19 0346 12/30/19 0837  GLUCAP 281* 175* 179*      SSI increased (0-20)  Holding metformin  On launtus 40  On 2u TF coverage q4h  Dysphagia . Secondary to stroke . NPO . On TF via Cortrak . Speech on board   Other Stroke Risk Factors  Former Cigarette smoker  ETOH use, alcohol level <10, will advise to drink no more than 1 drink(s) a day. Family aware  Polysubstance abuse, hx cocaine and marijuana- UDS:  THC NEGATIVE, Cocaine POSITIVE. Will advise to stop using due to stroke risk. Family aware.  Obesity, Body mass index is 35.94 kg/m., recommend weight loss, diet and exercise as appropriate   Congestive heart failure, on torsemide 20 bid ->decrease to daily given slight increase in Cre   Other Active Problems  Fatty liver  COPD  GERD  AKI 1.11    Hospital day # 3  Patient appears to be tolerating spontaneous breathing trials and is more alert today hence trial of extubation if tolerated as per critical care team.  Continue ongoing medical management.  Continue aspirin for now but check CT scan tomorrow if midline shift has improved we will start IV heparin and stop aspirin tomorrow.  Discussed with Dr. Kipp Brood critical care MD.  No family available at the bedside today  This patient is critically ill and at significant risk of neurological worsening, death and care requires constant monitoring of vital signs, hemodynamics,respiratory and cardiac monitoring, extensive review of multiple databases, frequent neurological assessment, discussion with family, other specialists and medical decision making of high complexity.I have made any additions or clarifications  directly to the above note.This critical care time does not reflect procedure time, or teaching time or supervisory time of PA/NP/Med Resident etc but could involve care discussion time.  I spent 30 minutes of neurocritical care time  in the care of  this patient.  Antony Contras, MD    To contact Stroke Continuity provider, please refer to [email or web address] After hours, contact General Neurology

## 2019-12-30 NOTE — Progress Notes (Signed)
Inpatient Diabetes Program Recommendations  AACE/ADA: New Consensus Statement on Inpatient Glycemic Control (2015)  Target Ranges:  Prepandial:   less than 140 mg/dL      Peak postprandial:   less than 180 mg/dL (1-2 hours)      Critically ill patients:  140 - 180 mg/dL   Lab Results  Component Value Date   GLUCAP 175 (H) 12/30/2019   HGBA1C 11.6 (H) 12/27/2019    Review of Glycemic Control Results for Robin [name], Robin [name] (MRN [id number]) as of 12/30/2019 08:11  Ref. Range 12/29/2019 11:40 12/29/2019 15:33 12/29/2019 19:44 12/29/2019 23:44 12/30/2019 03:46  Glucose-Capillary Latest Ref Range: 70 - 99 mg/dL 249 (H) 161 (H) 221 (H) 281 (H) 175 (H)    Current orders for Inpatient glycemic control:  Novolog 0-20 Q4H Novolog 2 units tube feed coverage Q4H Lantus 40 units at bedtime   Inpatient Diabetes Program Recommendations:    Please administer Novolog 0-20 Q4H and Novolog 2 units tube feed coverage together  Thank you, Reche Dixon, RN, BSN Diabetes Coordinator Inpatient Diabetes Program [phone] (team pager from 8a-5p)

## 2019-12-30 NOTE — Progress Notes (Signed)
NAME:  Robin [name], MRN:  [id number], DOB:  [birth year], LOS: 3 ADMISSION DATE:  12/26/2019, CONSULTATION DATE:  12/30/19 REFERRING MD:  Cheral Marker, CHIEF COMPLAINT:  Acute CVA   Brief History   54 year old female admitted with acute MCA CVA and left ICA LVO, taken emergently for thrombectomy and left intubated post procedure.  PCCM consulted for vent management.  History of present illness   54 year old female with a history of diabetes pretension, A. fib on Eliquis, heart failure, substance abuse who presented after a fall with right-sided weakness as a code stroke.  Found to have left MCA CVA and left ICA occlusion.  Taken emergently for thrombectomy and transferred to the ICU intubated.  PCCM consulted for vent management  Past Medical History   has a past medical history of Allergy, Anemia, Arthritis, CHF (congestive heart failure) ([place]), Chronic abdominal pain, Cocaine abuse ([place]), COPD (chronic obstructive pulmonary disease) ([place]), Essential hypertension, benign, Fatty liver, GERD (gastroesophageal reflux disease), Gout (2016), Normal coronary arteries, Ovarian cyst, and Type 2 diabetes mellitus ([place]).   Significant Hospital Events   5/24 Admit to neurology  Consults:  PCCM  Procedures:  5/24 ETT > 5/27  Significant Diagnostic Tests:  5/23 CT head >evolving hypodensity involving the left basal ganglia, left insula, and adjacent left temporal lobe, which could reflect an evolving left MCA territory infarct. Additionally, there is question of asymmetric hyperdensity at the left ICA terminus, which could reflect intraluminal thrombus/LVO MRI 5/24 > Evolving left MCA territory infarct with hemorrhagic transformation. Mass effect with 6 mm rightward midline shift. 7 mm paraclinoid right ICA aneurysm. Echo 5/24 > LVEF 35-45%. LV mild dilation. Global HK. Mod asymmetric LV hypertrophy. Both atria severely dilated.  CT head 5/24 > Acute left MCA territory infarct with petechial hemorrhage.  No evident progression from brain MRI yesterday. Midline shift measures 7 mm. CT head 5/26 > unchanged appearance of large left MCA infarct with petechial hemorrhage.  Micro Data:  5/24 Sars-Cov-2>>negative 5/24 UC>>  Antimicrobials:    Interim history/subjective:  Following basic commands on left. Tolerating PSV 5/5 well.  Objective   Blood pressure (!) 149/94, pulse (!) 120, temperature 98.4 F (36.9 C), temperature source Axillary, resp. rate 18, height 5\' 6"  (1.676 m), weight 101 kg, last menstrual period 08/21/2011, SpO2 98 %.    Vent Mode: PSV;CPAP FiO2 (%):  [30 %-40 %] 40 % Set Rate:  [16 bmp] 16 bmp Vt Set:  [470 mL] 470 mL PEEP:  [5 cmH20] 5 cmH20 Pressure Support:  [5 cmH20-8 cmH20] 8 cmH20 Plateau Pressure:  [11 cmH20-16 cmH20] 16 cmH20   Intake/Output Summary (Last 24 hours) at 12/30/2019 0936 Last data filed at 12/30/2019 0700 Gross per 24 hour  Intake 933.75 ml  Output 2300 ml  Net -1366.25 ml   Filed Weights   12/26/19 2243 12/28/19 0500 12/30/19 0403  Weight: 98.9 kg 100.8 kg 101 kg    General: adult female, obese, on vent HEENT: MM pink/moist, ETT Neuro: Sedation off.  Moves left side to command. CV: IRIR, no M/R/G. PULM: Clear, no wheeze. GI: Soft, NT, ND. Extremities: No acute deformity or edema. Skin: no rashes or lesions.   Assessment & Plan:   Acute left MCA ischemic CVA with left ICA LVO s/p thrombectomy Management per neurology, SBP goal 120-130 R ICA aneurysm  - Per stroke service.   Acute hypoxemic respirtory failure: in the post operative setting requiring sedation.  COPD without acute exacerbation.  - Extubate. - Bronchial hygiene. - SLP  eval once extubated.  Atrial fibrillation HTN Chronic HFrEF: LVEF 35% - Likely can resume eliquis today, will defer to stroke team. - Continue home coreg, digoxin, torsemide.  Type 2 diabetes - Holding metformin. - Continue SSI and lantus.  Nutrition  - Tube feeds. - SLP eval once  extubated.  Extubate now.  Rest per primary team.  Nothing further to add.  PCCM will sign off.  Please call back if we can be of further assistance.  Best practice:  Diet: TF's. SLP eval once extubated. Pain/Anxiety/Delirium protocol (if indicated): N/A. VAP protocol (if indicated): HOB 30 degrees DVT prophylaxis: SCDs GI prophylaxis: Protonix Glucose control: SSI Mobility: Bedrest Code Status: Full code Family Communication: Per primary. Disposition: ICU  CC time: 30 min.  Montey Hora, [facility] 12/30/2019, 9:44 AM

## 2019-12-30 NOTE — Progress Notes (Signed)
SLP Cancellation Note  Patient Details Name: Robin [name] MRN: [id number] DOB: 12-31-1965   Cancelled treatment:       Reason Eval/Treat Not Completed: Medical issues which prohibited therapy (remains on vent).     Osie Bond., M.A. [facility] Pager [phone] Office [phone]  12/30/2019, 7:07 AM

## 2019-12-31 ENCOUNTER — Inpatient Hospital Stay (HOSPITAL_COMMUNITY): Payer: 59

## 2019-12-31 LAB — GLUCOSE, CAPILLARY
Glucose-Capillary: 145 mg/dL — ABNORMAL HIGH (ref 70–99)
Glucose-Capillary: 150 mg/dL — ABNORMAL HIGH (ref 70–99)
Glucose-Capillary: 164 mg/dL — ABNORMAL HIGH (ref 70–99)
Glucose-Capillary: 188 mg/dL — ABNORMAL HIGH (ref 70–99)
Glucose-Capillary: 201 mg/dL — ABNORMAL HIGH (ref 70–99)
Glucose-Capillary: 211 mg/dL — ABNORMAL HIGH (ref 70–99)

## 2019-12-31 LAB — CBC
HCT: 45 % (ref 36.0–46.0)
Hemoglobin: 13.4 g/dL (ref 12.0–15.0)
MCH: 29.3 pg (ref 26.0–34.0)
MCHC: 29.8 g/dL — ABNORMAL LOW (ref 30.0–36.0)
MCV: 98.5 fL (ref 80.0–100.0)
Platelets: 216 10*3/uL (ref 150–400)
RBC: 4.57 MIL/uL (ref 3.87–5.11)
RDW: 14.9 % (ref 11.5–15.5)
WBC: 8.9 10*3/uL (ref 4.0–10.5)
nRBC: 0 % (ref 0.0–0.2)

## 2019-12-31 LAB — BASIC METABOLIC PANEL
Anion gap: 13 (ref 5–15)
BUN: 35 mg/dL — ABNORMAL HIGH (ref 6–20)
CO2: 30 mmol/L (ref 22–32)
Calcium: 10.1 mg/dL (ref 8.9–10.3)
Chloride: 118 mmol/L — ABNORMAL HIGH (ref 98–111)
Creatinine, Ser: 1.07 mg/dL — ABNORMAL HIGH (ref 0.44–1.00)
GFR calc Af Amer: >60 mL/min (ref >60)
GFR calc non Af Amer: 59 mL/min — ABNORMAL LOW (ref >60)
Glucose, Bld: 145 mg/dL — ABNORMAL HIGH (ref 70–99)
Potassium: 4 mmol/L (ref 3.5–5.1)
Sodium: 161 mmol/L (ref 135–145)

## 2019-12-31 LAB — MRSA PCR SCREENING: MRSA by PCR: NEGATIVE

## 2019-12-31 LAB — HEPARIN LEVEL (UNFRACTIONATED): Heparin Unfractionated: 0.56 IU/mL (ref 0.30–0.70)

## 2019-12-31 IMAGING — CT CT HEAD W/O CM
4 series · 17 of 47 positions shown, 19 images · non-contrast
Comparison: Head CT [DATE]

CLINICAL DATA: Stroke follow-up

EXAM:
CT HEAD WITHOUT CONTRAST
TECHNIQUE: Contiguous axial images were obtained from the base of the skull
through the vertex without intravenous contrast.

[Series 3: head without · axial · non-contrast · 0.42mm/px · z∈[-132,-2]mm · 7 of 36 slices shown, 9 images]
[im 5/36  brain]
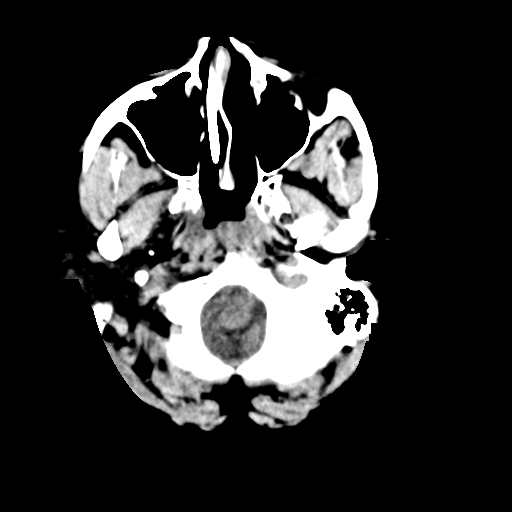
[im 5/36  bone]
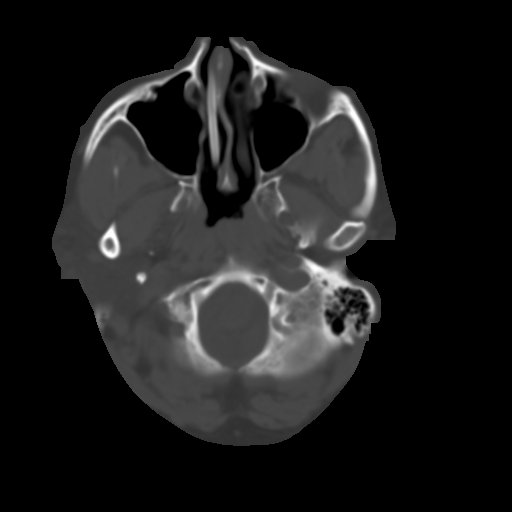
[im 9/36  brain]
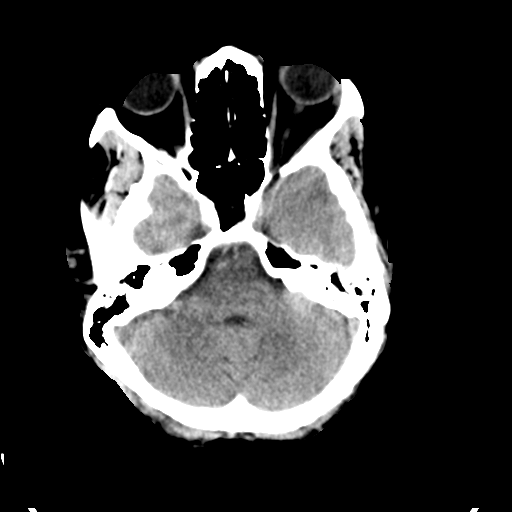
[im 14/36  brain]
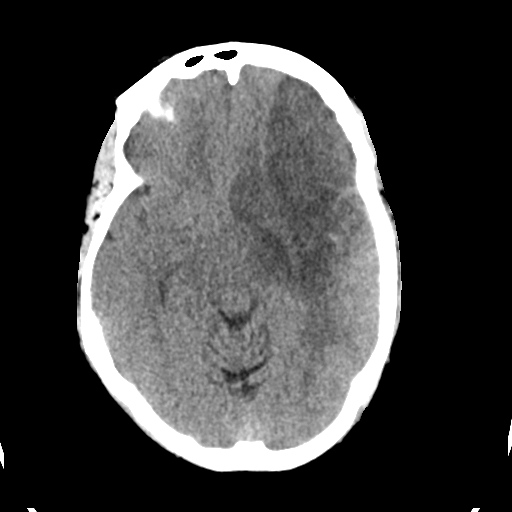
[im 18/36  brain]
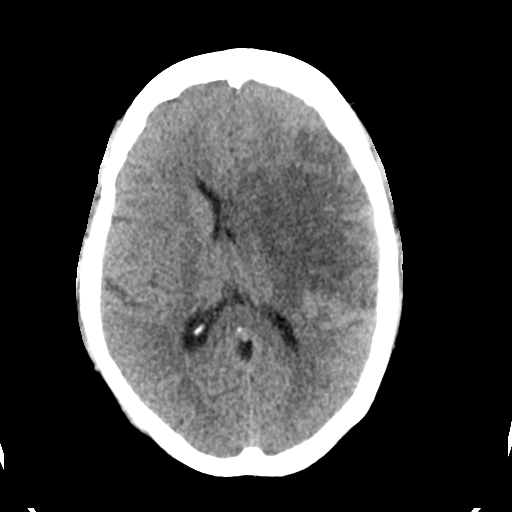
[im 22/36  brain]
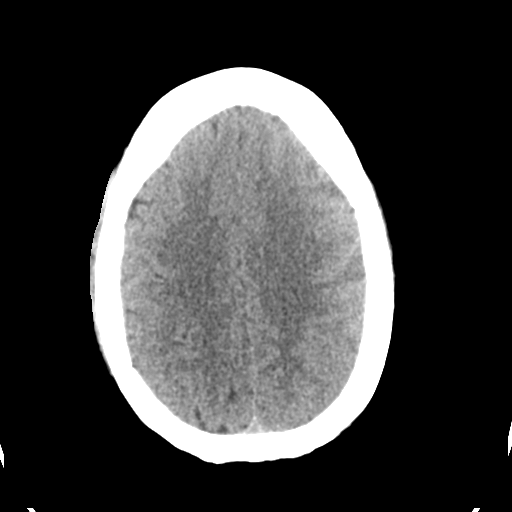
[im 22/36  bone]
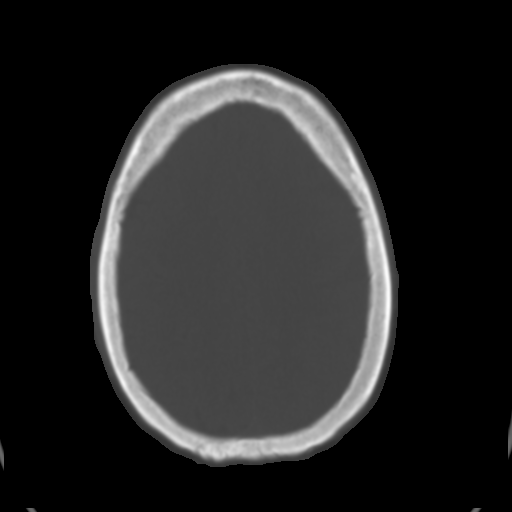
[im 27/36  brain]
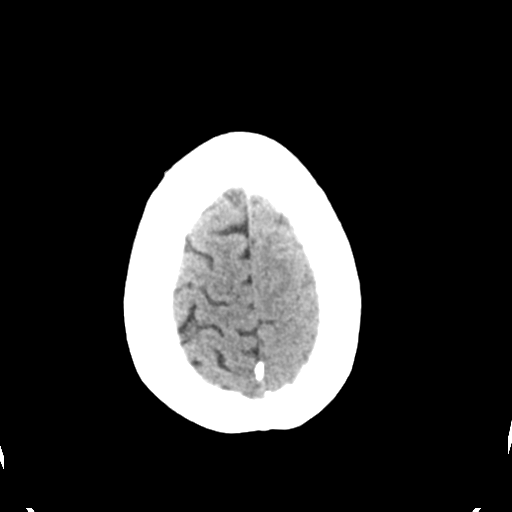
[im 31/36  brain]
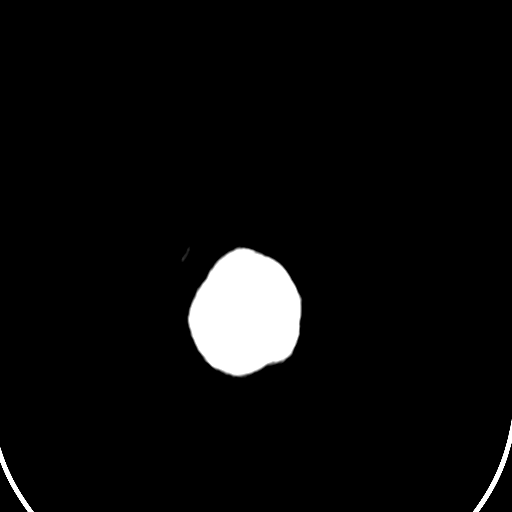

[Series 4: head bone · axial · 0.42mm/px · z∈[-136,-72]mm · 4 of 90 slices shown]
[im 9/90  bone]
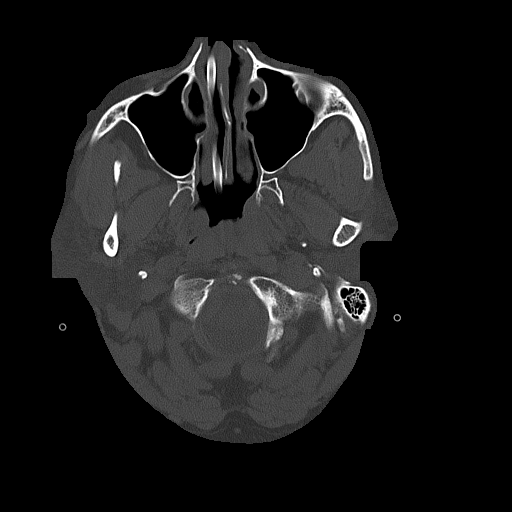
[im 18/90  bone]
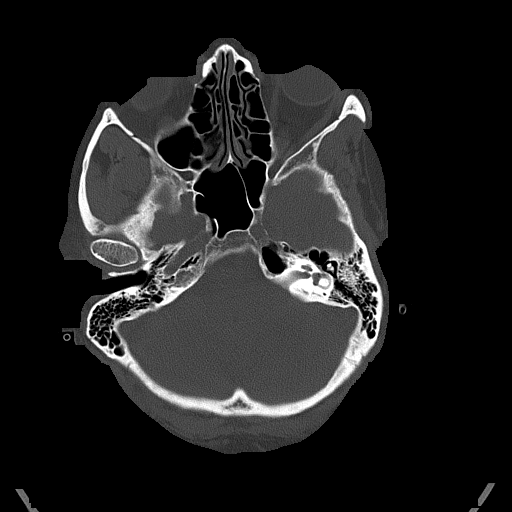
[im 27/90  bone]
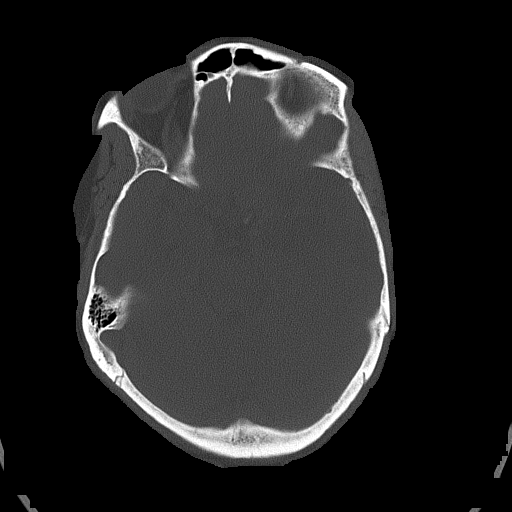
[im 41/90  bone]
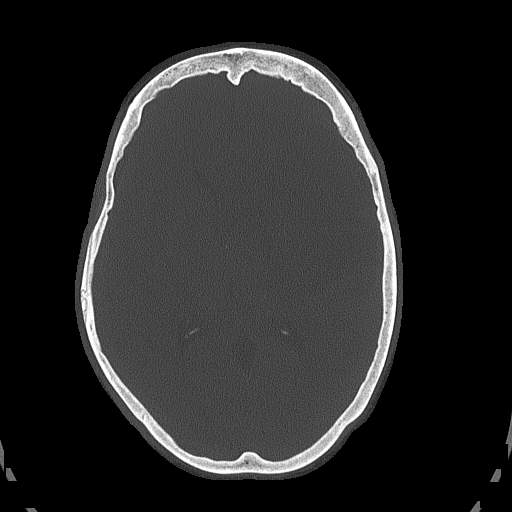

[Series 5: head without cor · coronal · non-contrast · 0.42mm/px · 3 of 75 slices shown]
[im 25/75  brain]
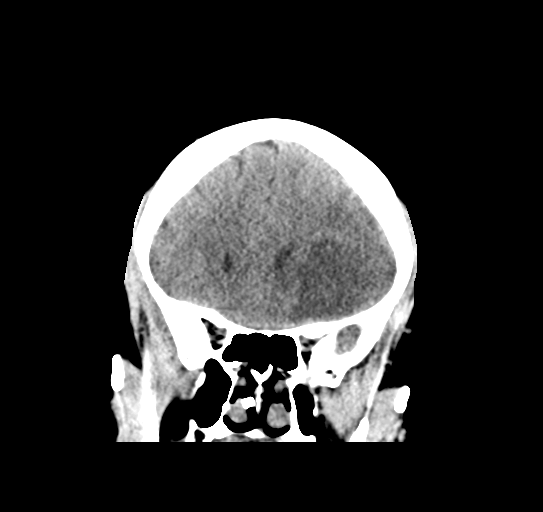
[im 33/75  brain]
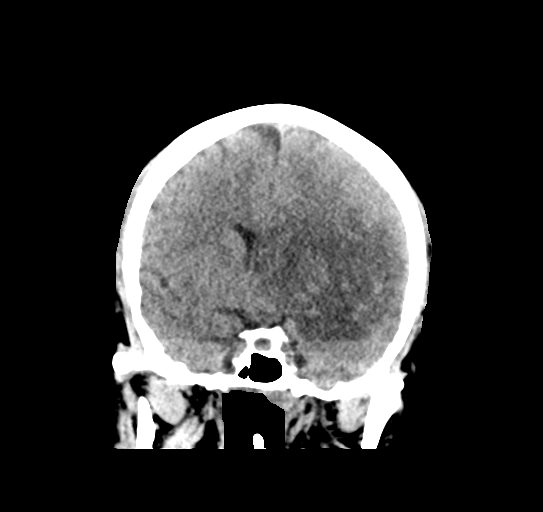
[im 42/75  brain]
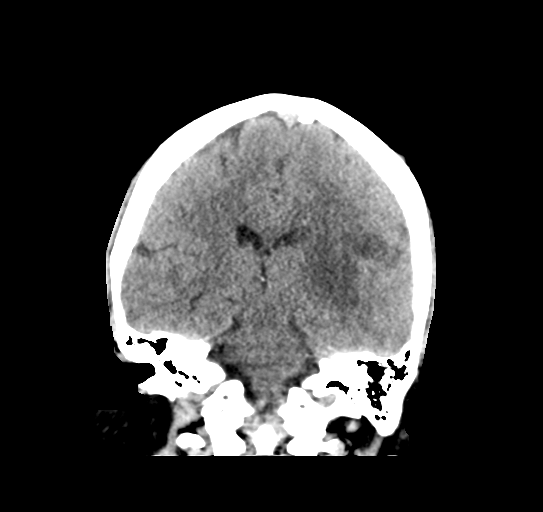

[Series 6: head without sag · sagittal · non-contrast · 0.40mm/px · 3 of 66 slices shown]
[im 22/66  brain]
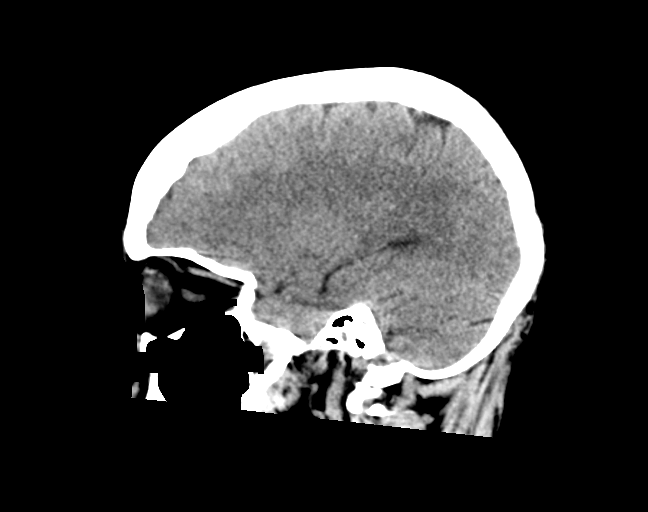
[im 33/66  brain]
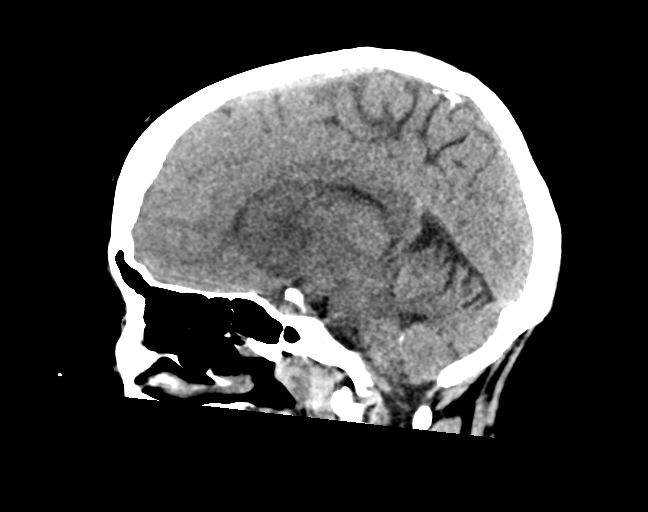
[im 44/66  brain]
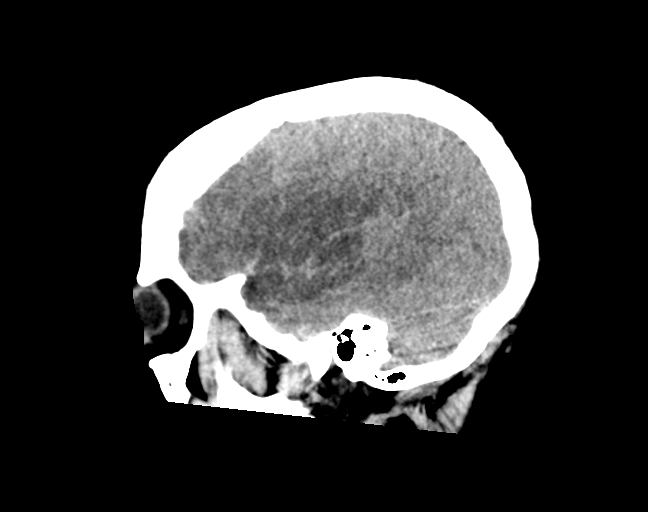

[17 of 47 positions shown; findings below may reference images not displayed]

FINDINGS: Brain: Unchanged appearance of left MCA territory infarct with
cytotoxic edema causing leftward midline shift of 7 mm. Unchanged
pattern of petechial hemorrhage. No intraparenchymal hematoma. No
hydrocephalus.

Vascular: No hyperdense vessel or unexpected calcification.

Skull: Normal. Negative for fracture or focal lesion.

Sinuses/Orbits: No acute finding.

Other: None.
IMPRESSION: 1. Unchanged appearance of left MCA territory infarct with petechial
hemorrhage.
2. Unchanged leftward midline shift of 7 mm.

## 2019-12-31 MED ORDER — ISOSORB DINITRATE-HYDRALAZINE 20-37.5 MG PO TABS
2.0000 | ORAL_TABLET | Freq: Three times a day (TID) | ORAL | Status: DC
  Administered 2019-12-31 – 2020-01-05 (×16): 2
  Filled 2019-12-31 (×18): qty 2

## 2019-12-31 MED ORDER — RESOURCE THICKENUP CLEAR PO POWD
ORAL | Status: DC | PRN
  Filled 2019-12-31: qty 125

## 2019-12-31 MED ORDER — HEPARIN (PORCINE) 25000 UT/250ML-% IV SOLN
1150.0000 [IU]/h | INTRAVENOUS | Status: AC
  Administered 2019-12-31: 1050 [IU]/h via INTRAVENOUS
  Filled 2019-12-31 (×3): qty 250

## 2019-12-31 MED ORDER — ROSUVASTATIN CALCIUM 20 MG PO TABS
20.0000 mg | ORAL_TABLET | Freq: Every day | ORAL | Status: DC
  Administered 2019-12-31 – 2020-01-05 (×6): 20 mg
  Filled 2019-12-31 (×6): qty 1

## 2019-12-31 MED ORDER — IPRATROPIUM-ALBUTEROL 0.5-2.5 (3) MG/3ML IN SOLN
3.0000 mL | Freq: Two times a day (BID) | RESPIRATORY_TRACT | Status: DC
  Administered 2019-12-31 – 2020-01-01 (×3): 3 mL via RESPIRATORY_TRACT
  Filled 2019-12-31 (×3): qty 3

## 2019-12-31 MED ORDER — FREE WATER
200.0000 mL | Freq: Four times a day (QID) | Status: DC
  Administered 2019-12-31 – 2020-01-05 (×21): 200 mL

## 2019-12-31 NOTE — PMR Pre-admission (Addendum)
PMR Admission Coordinator Pre-Admission Assessment  Patient: Robin [name] is an 54 y.o., female MRN: [id number] DOB: 29-Dec-1965 Height: '5\' 6"'$  (167.6 cm) Weight: 98 kg              Insurance Information HMO:     PPO: yes     PCP:      IPA:      80/20:      OTHER:  PRIMARY: [facility]      Policy#: [id number]      Subscriber: patient CM Name: Faxed approval from Utilization Management Department (confirmedCIR approval with Felicita Gage at [phone])      Phone#: [phone]    Fax#: [phone] Pre-Cert#: case originally pended for review under reference # [id number] (however auth provided under different number ([id number]). Per Felicita Gage at [facility], the original case number [id number] was entered in error and voided and a new case number was created with new Auth for CIR at [place]      Employer:  Auth provided by faxed approval from Utilization Management Department and was confirmed via phone call with Felicita Gage at [facility] at [phone]. Pt is approved for CIR under auth [id number] for 7 days with start date 01/05/20 and end date 01/11/20. Providers to call [phone] for questions regarding authorization.  Benefits:  Phone #: [phone], option 8     Name: Daphene Jaeger. Date: 08/06/19-01/23/20   Deduct: $0 does not have      Out of Pocket Max: $1,500 ($706.27 met)      Life Max: NA  CIR: 80% coverage, 20% co-insurance up til OOP Max      SNF: 80% coverage, 20% co-insurance: limited to 60 days per cal yr Outpatient: $30 co-pay per visit; 30 per calendar year combined therapies     Home Health: 80% coverage, 20% co-insurance DME: 80% coverage, 20% co-insurance     Providers:  SECONDARY: None      Policy#:       Phone#:   Development worker, community:       Phone#:   The Engineer, petroleum" for patients in Inpatient Rehabilitation Facilities with attached "Privacy Act [place] Records" was provided and verbally reviewed with: N/A  Emergency Contact  Information Contact Information    Name Relation Home Work Mobile   [place] Sister   [phone]     Current Medical History  Patient Admitting Diagnosis:  L MCA occlusion s/p thrombectomy with R hemiplegia, dysphagia and likely some aphasia  History of Present Illness: Robin [name] is a 54 year old female history of diastolic congestive heart failure, hypertension, atrial fibrillation on Eliquis, COPD, polysubstance abuse, diabetes mellitus and tobacco abuse.  Presented 12/27/2019 to [facility] with acute onset of right side weakness and altered mental status as well as aphasia.  Cranial CT scan showed question evolving hypodensity involving the left basal ganglia left insula and adjacent left temporal lobe.  CT perfusion scan as well as CTA of head and neck showed a 24 cc acute infarct deep and subcortical white matter of the anterior left frontal region.  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  Patient did not receive TPA.  Patient was transferred to [facility].  MRI showed evolving left MCA territory infarction with hemorrhagic transformation.  Mass-effect with 6 mm rightward midline shift.  Admission chemistries urine drug screen positive cocaine, glucose 355, creatinine 1.15, troponin negative, BNP 367, alcohol negative.  Echocardiogram with ejection fraction of 35%  with global hypokinesis.  Patient underwent mechanical thrombectomy per interventional radiology required intubation and was extubated 12/28/2019.  Initially maintained on low-dose aspirin and transitioned back to Eliquis.  Dysphagia #1 pudding thick liquid diet with nasogastric tube for nutritional support.  Therapy evaluations completed and patient is to be admitted for a comprehensive rehab program on 01/05/20.  Complete NIHSS TOTAL: 19 Glasgow Coma Scale Score: 13  Past Medical History  Past Medical History:  Diagnosis Date  . Allergy   . Anemia   . Arthritis   . CHF (congestive  heart failure) ([place])    a. EF at 30-35% by echo in 08/2018 b. EF at 35% by repeat echo in 05/2019  . Chronic abdominal pain   . Cocaine abuse ([place])   . COPD (chronic obstructive pulmonary disease) ([place])   . Essential hypertension, benign   . Fatty liver   . GERD (gastroesophageal reflux disease)   . Gout 2016  . Normal coronary arteries    3/10 - following abnormal Myoview  . Ovarian cyst   . Type 2 diabetes mellitus (HCC)     Family History  family history includes Alcohol abuse in her father, mother, and son; Cirrhosis in her mother; Diabetes type II in her father and sister; Early death in her brother and mother.  Prior Rehab/Hospitalizations:  Has the patient had prior rehab or hospitalizations prior to admission? Yes  Has the patient had major surgery during 100 days prior to admission? Yes  Current Medications   Current Facility-Administered Medications:  .   stroke: mapping our early stages of recovery book, , Does not apply, Once, Biby, Sharon L, NP .  0.9 %  sodium chloride infusion, , Intravenous, PRN, Miachel Roux, Sharon L, NP .  acetaminophen (TYLENOL) tablet 650 mg, 650 mg, Oral, Q4H PRN **OR** acetaminophen (TYLENOL) 160 MG/5ML solution 650 mg, 650 mg, Per Tube, Q4H PRN, 650 mg at 01/04/20 1427 **OR** acetaminophen (TYLENOL) suppository 650 mg, 650 mg, Rectal, Q4H PRN, Biby, Sharon L, NP .  apixaban (ELIQUIS) tablet 5 mg, 5 mg, Per Tube, BID, Pierce, Dwayne A, RPH, 5 mg at 01/05/20 0925 .  carvedilol (COREG) tablet 50 mg, 50 mg, Per Tube, BID WC, Biby, Sharon L, NP, 50 mg at 01/05/20 0819 .  chlorhexidine (PERIDEX) 0.12 % solution 15 mL, 15 mL, Mouth Rinse, BID, Biby, Sharon L, NP, 15 mL at 01/05/20 0932 .  Chlorhexidine Gluconate Cloth 2 % PADS 6 each, 6 each, Topical, Daily, Donzetta Starch, NP, 6 each at 01/05/20 0932 .  digoxin (LANOXIN) tablet 0.125 mg, 0.125 mg, Per Tube, Daily, Biby, Sharon L, NP, 0.125 mg at 01/05/20 0926 .  feeding supplement (GLUCERNA 1.2 CAL) liquid  1,000 mL, 1,000 mL, Per Tube, Continuous, Biby, Sharon L, NP, Last Rate: 60 mL/hr at 01/04/20 2254, 1,000 mL at 01/04/20 2254 .  feeding supplement (PRO-STAT SUGAR FREE 64) liquid 30 mL, 30 mL, Per Tube, Daily, Biby, Sharon L, NP, 30 mL at 01/05/20 0925 .  free water 200 mL, 200 mL, Per Tube, Q6H, Biby, Sharon L, NP, 200 mL at 01/05/20 0649 .  gabapentin (NEURONTIN) 250 MG/5ML solution 100 mg, 100 mg, Per Tube, Q8H, Biby, Sharon L, NP, 100 mg at 01/05/20 0649 .  insulin aspart (novoLOG) injection 0-20 Units, 0-20 Units, Subcutaneous, Q4H, Biby, Sharon L, NP, 4 Units at 01/05/20 0819 .  insulin aspart (novoLOG) injection 2 Units, 2 Units, Subcutaneous, Q4H, Donzetta Starch, NP, 2 Units at 01/05/20 0819 .  insulin glargine (  LANTUS) injection 60 Units, 60 Units, Subcutaneous, QHS, Biby, Massie Kluver, NP, 60 Units at 01/04/20 2149 .  ipratropium-albuterol (DUONEB) 0.5-2.5 (3) MG/3ML nebulizer solution 3 mL, 3 mL, Nebulization, Q4H PRN, Sethi, Pramod S, MD .  isosorbide-hydrALAZINE (BIDIL) 20-37.5 MG per tablet 2 tablet, 2 tablet, Per Tube, TID, Donzetta Starch, NP, 2 tablet at 01/05/20 0926 .  labetalol (NORMODYNE) injection 10-40 mg, 10-40 mg, Intravenous, Q10 min PRN, Donzetta Starch, NP, 40 mg at 12/29/19 0117 .  MEDLINE mouth rinse, 15 mL, Mouth Rinse, q12n4p, Biby, Sharon L, NP, 15 mL at 01/04/20 1704 .  metFORMIN (GLUCOPHAGE) tablet 500 mg, 500 mg, Oral, BID WC, Rosalin Hawking, MD, 500 mg at 01/05/20 6283 .  multivitamin with minerals tablet 1 tablet, 1 tablet, Per Tube, Daily, Donzetta Starch, NP, 1 tablet at 01/05/20 0926 .  pantoprazole sodium (PROTONIX) 40 mg/20 mL oral suspension 40 mg, 40 mg, Per Tube, Daily, Biby, Sharon L, NP, 40 mg at 01/05/20 0926 .  potassium chloride (KLOR-CON) packet 40 mEq, 40 mEq, Per Tube, Daily, Rinehuls, David L, PA-C, 40 mEq at 01/05/20 0925 .  Resource ThickenUp Clear, , Oral, PRN, Donzetta Starch, NP .  rosuvastatin (CRESTOR) tablet 20 mg, 20 mg, Per Tube, Daily, Biby,  Sharon L, NP, 20 mg at 01/05/20 0925 .  senna-docusate (Senokot-S) tablet 1 tablet, 1 tablet, Per Tube, QHS PRN, Donzetta Starch, NP, 1 tablet at 01/01/20 1629 .  sodium chloride flush (NS) 0.9 % injection 10-40 mL, 10-40 mL, Intracatheter, Q12H, Biby, Sharon L, NP, 10 mL at 01/05/20 0933 .  sodium chloride flush (NS) 0.9 % injection 10-40 mL, 10-40 mL, Intracatheter, PRN, Burnetta Sabin L, NP, 10 mL at 01/04/20 2150 .  tiZANidine (ZANAFLEX) tablet 2 mg, 2 mg, Per Tube, BID PRN, Donzetta Starch, NP, 2 mg at 01/01/20 2157 .  torsemide (DEMADEX) tablet 20 mg, 20 mg, Per Tube, Daily, Biby, Sharon L, NP, 20 mg at 01/05/20 6629  Patients Current Diet:  Diet Order            DIET - DYS 1 Room service appropriate? 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  Independent  Indoor Mobility: Did the patient need assistance with walking from room to room (with or without device)? Independent  Stairs: Did the patient need assistance with internal or external stairs (with or without device)? Independent  Functional Cognition: Did the patient need help planning regular tasks such as shopping or remembering to take medications? Independent  Home Assistive Devices / Equipment Home Equipment: None  Prior Device Use: Indicate devices/aids used by the patient prior to current  illness, exacerbation or injury? None of the above  Current Functional Level Cognition  Arousal/Alertness: Awake/alert Overall Cognitive Status: Difficult to assess Difficult to assess due to: Impaired communication Orientation Level: Oriented  to [place] General Comments: pt continues to be flat during sessions presenting with eyes closed often, however pt gesturing appropriately to bathroom when needing to void bowels Attention: Sustained Sustained Attention: Appears intact Awareness: Impaired Awareness Impairment: Emergent impairment Problem Solving: Appears intact(functional, basic)    Extremity Assessment (includes Sensation/Coordination)  Upper Extremity Assessment: RUE deficits/detail RUE Deficits / Details: flaccid RUE, observed sponteous movement after wrist flexion/extension to hand  RUE Sensation: decreased proprioception RUE Coordination: decreased fine motor, decreased gross motor LUE Deficits / Details: observed sponteous movement  Lower Extremity Assessment: LLE deficits/detail RLE Deficits / Details: no active movement noted on R, maybe mild withdraw to pain, PROM generally WFL, still heel cord LLE Deficits / Details: moving in knee extension and back down on bed with contact and without for a short time. pt responds to tactile input with same movement pattern.     ADLs  Overall ADL's : Needs assistance/impaired Grooming: Wash/dry face, Sitting Grooming Details (indicate cue type and reason): with LUE sitting on stedy at sink Toilet Transfer: Maximal assistance, +2 for physical assistance, +2 for safety/equipment, Cueing for safety, Moderate assistance Toilet Transfer Details (indicate cue type and reason): +2 mod- max assist for sit<>stand with stedy to [place]- Clothing Manipulation and Hygiene: Total assistance, +2 for physical assistance, Sit to/from stand, Bed level Toileting - Clothing Manipulation Details (indicate cue type and reason): total A +2 to  attempt pericare in standing however pt unable to tolerate extensive standing d/t R anterior lean; would likely needing total A from bed level Functional mobility during ADLs: Maximal assistance, +2 for physical assistance, +2 for safety/equipment, Cueing for safety, Cueing for sequencing, Moderate assistance General ADL Comments: pt continues to present with decreased activity tolerance, R sided weakness and impaired functional mobility impacting pts ability to engage in BADLs.    Mobility  Overal bed mobility: Needs Assistance Bed Mobility: Supine to Sit Rolling: Mod assist, +2 for physical assistance Sidelying to sit: Max assist, +2 for physical assistance Supine to sit: HOB elevated, Mod assist, +2 for physical assistance Sit to supine: Mod assist, +2 for physical assistance General bed mobility comments: pt with better initiation this session, pt requires assist to advance RLE to EOB and reach for rail with RUE. use of bed pad to scoot hips to EOB    Transfers  Overall transfer level: Needs assistance Equipment used: Ambulation equipment used Transfer via Lift Equipment: Stedy Transfers: Sit to/from Stand Sit to Stand: +2 physical assistance, Mod assist, Max assist Squat pivot transfers: Max assist, +2 physical assistance  Lateral/Scoot Transfers: Max assist, +2 physical assistance, +2 safety/equipment General transfer comment: pt initially required MOD A for sit<>stand from EOB, however pt required MAX A from low BSC to power into standing. Pt with R anterior lean during session needing verbal and tactile cues to stand upright    Ambulation / Gait / Stairs / Wheelchair Mobility  Ambulation/Gait Ambulation/Gait assistance: Max assist, +2 physical assistance Gait Distance (Feet): 1 Feet Assistive device: 2 [place] hand held assist(holding back of recliner) Gait Pattern/deviations: Step-to pattern, Shuffle, Wide base of support General Gait Details: scooting L foot up toward [facility] and  assist for weight shift and to move R foot over x 2-3 "steps"    Posture / Balance Dynamic Sitting Balance Sitting balance - Comments: MIN A- min guard for sitting balance Balance Overall balance assessment: Needs assistance Sitting-balance support: Feet supported, Single extremity supported Sitting balance-Leahy Scale: Fair Sitting balance - Comments: MIN A- min guard for  sitting balance Postural control: Posterior lean, Right lateral lean Standing balance support: Bilateral upper extremity supported Standing balance-Leahy Scale: Poor Standing balance comment: relies on external support in stedy     Special needs/care consideration Continuous Drip IV: feeding supplement liquid 1,000 mL  Skin: serous blister to mid back  Diabetic management: yes   Bowel and bladder incontinence; last BM 01/03/20  Has Nasoenteric feeding tube cortrack  and Designated visitor: [name] (sister) and Freada Bergeron (daughter)     Previous Environmental health practitioner (from acute therapy documentation) Living Arrangements: Spouse/significant other(will likely go to son or daughter's home)  Lives With: Significant other Available Help at Discharge: Family, Available 24 hours/day Type of Home: House Home Layout: One level Home Access: Stairs to enter Entrance Stairs-Rails: Right Entrance Stairs-Number of Steps: 3-4 Bathroom Shower/Tub: Chiropodist: Standard Additional Comments: per chart and RN report, sister has been present and supportive. Will need to confirm home setup/PLOF  Discharge Living Setting Plans for Discharge Living Setting: Other (Comment)(pt will stay with her daugther at [place] at her daugther's house) Type of Home at Discharge: House Discharge Home Layout: One level Discharge Home Access: Stairs to enter Entrance Stairs-Rails: None Entrance Stairs-Number of Steps: 3 Discharge Bathroom Shower/Tub: Tub/shower unit Discharge Bathroom Toilet: Standard Discharge Bathroom Accessibility:  Yes How Accessible: Accessible via walker Does the patient have any problems obtaining your medications?: Yes (Describe)(pt's sister reports finances may be factor)  Social/Family/Support Systems Patient Roles: Other (Comment)(has grown children; works full time in SNF) Sport and exercise psychologist Information: sister ([name]): [phone]; daughter Freada Bergeron [phone]) Anticipated Caregiver: [name], Freada Bergeron, and the patient's two sons Anticipated Caregiver's Contact Information: see above Ability/Limitations of Caregiver: Min A Caregiver Availability: 24/7 Discharge Plan Discussed with Primary Caregiver: Yes(spoke with pt's sister and her daughter) Is Caregiver In Agreement with Plan?: Yes Does Caregiver/Family have Issues with Lodging/Transportation while Pt is in Rehab?: No   Goals Patient/Family Goal for Rehab: PT/OT/SLP: Min A Expected length of stay: 24-28 days  Cultural Considerations: NA Pt/Family Agrees to Admission and willing to participate: Yes Program Orientation Provided & Reviewed with Pt/Caregiver Including Roles  & Responsibilities: Yes(pt and her daugther and sister)  Barriers to Discharge: Home environment access/layout, Nutrition means  Barriers to Discharge Comments: steps to enter home;  Cortrack   Decrease burden of Care through IP rehab admission: NA   Possible need for SNF placement upon discharge: Not anticipated; pt had good family support per sister and daughter and they are able to provide the recommended physical assistance needed at DC (min A).    Patient Condition: This patient's medical and functional status has changed since the consult dated: 12/29/19 in which the Rehabilitation Physician determined and documented that the patient's condition is appropriate for intensive rehabilitative care in an inpatient rehabilitation facility. See "History of Present Illness" (above) for medical update. Functional changes are: improvement in bed mobility from total A to Mod A, and pt has  progressed to OOB transfers requiring Mod/Max A +2. Patient's medical and functional status update has been discussed with the Rehabilitation physician and patient remains appropriate for inpatient rehabilitation. Will admit to inpatient rehab today.  Preadmission Screen Completed By:  Raechel Ache, OT, 01/05/2020 10:42 AM ______________________________________________________________________   Discussed status with Dr. Posey Pronto on 01/05/20 at 10:20AM and received approval for admission today.  Admission Coordinator:  Raechel Ache, time 10:20AM/Date 01/05/20.

## 2019-12-31 NOTE — Progress Notes (Signed)
Physical Therapy Treatment Patient Details Name: Robin [name] Calvin MRN: [id number] DOB: 06-15-1966 Today's Date: 12/31/2019    History of Present Illness 54 y.o. female with a history of DM, HTN, atrial fibrillation on Eliquis, anemia, CHF, COPD and polysubstance abuse, presenting initially to [facility] ED after being found unresponsive at home after family heard her fall. Pt transferred/admitted to [facility] for acute left MCA stroke and L ICA occlusion, is now s/p mechanical thrombectomy on 5/24, pt kept intubated post procedure.     PT Comments    Patient progressing with mobility able to work on sit to stand x 2 and stood enough to take side steps with A toward HOB.  She is able to follow commands in functional context with multimodal cues.  She responds well to family support/encouragement.  She will benefit from skilled PT in the acute setting and remains excellent candidate for CIR.   Follow Up Recommendations  CIR;Supervision/Assistance - 24 hour     Equipment Recommendations  Hospital bed;Wheelchair (measurements PT);Wheelchair cushion (measurements PT);3in1 (PT)    Recommendations for Other Services       Precautions / Restrictions Precautions Precautions: Fall Precaution Comments: R hemi    Mobility  Bed Mobility Overal bed mobility: Needs Assistance Bed Mobility: Supine to Sit     Supine to sit: HOB elevated;Mod assist;+2 for physical assistance     General bed mobility comments: pt initiating with L LE and pulling up with L UE assist for scooting R hip and for balance, increased time and multimodal cues for participation  Transfers Overall transfer level: Needs assistance   Transfers: Stand Pivot Transfers;Sit to/from Stand Sit to Stand: Max assist;+2 physical assistance         General transfer comment: used handle on back of recliner for L UE to pull up and assist to lift from EOB and extend trunk/hips and R knee blocked, sister in front to cues for keeping her  head up  Ambulation/Gait Ambulation/Gait assistance: Max assist;+2 physical assistance Gait Distance (Feet): 1 Feet Assistive device: 2 [place] hand held assist(holding back of recliner) Gait Pattern/deviations: Step-to pattern;Shuffle;Wide base of support     General Gait Details: scooting L foot up toward HOB and assist for weight shift and to move R foot over x 2-3 "steps"   Stairs             Wheelchair Mobility    Modified Rankin (Stroke Patients Only) Modified Rankin (Stroke Patients Only) Pre-Morbid Rankin Score: No symptoms Modified Rankin: Severe disability     Balance Overall balance assessment: Needs assistance Sitting-balance support: Feet supported Sitting balance-Leahy Scale: Poor Sitting balance - Comments: mod to min A for balance leaning R at times, after standing able to sit with S for brief moment   Standing balance support: Single extremity supported Standing balance-Leahy Scale: Zero Standing balance comment: stood x about 30 seconds then second time for about a minute and some to take steps toward [facility]                            Cognition Arousal/Alertness: Awake/alert Behavior During Therapy: Flat affect Overall Cognitive Status: Difficult to assess(aphasia)                                        Exercises Other Exercises Other Exercises: PROM R LE in supine  General Comments General comments (skin integrity, edema, etc.): VSS on O2      Pertinent Vitals/Pain Pain Assessment: Faces Faces Pain Scale: No hurt    Home Living                      Prior Function            PT Goals (current goals can now be found in the care plan section) Progress towards PT goals: Progressing toward goals    Frequency    Min 4X/week      PT Plan Current plan remains appropriate    Co-evaluation              AM-PAC PT "6 Clicks" Mobility   Outcome Measure  Help needed turning from your back  to your side while in a flat bed without using bedrails?: A Lot Help needed moving from lying on your back to sitting on the side of a flat bed without using bedrails?: A Lot Help needed moving to and from a bed to a chair (including a wheelchair)?: Total Help needed standing up from a chair using your arms (e.g., wheelchair or bedside chair)?: Total Help needed to walk in hospital room?: Total Help needed climbing 3-5 steps with a railing? : Total 6 Click Score: 8    End of Session Equipment Utilized During Treatment: Gait belt;Oxygen Activity Tolerance: Patient tolerated treatment well Patient left: in bed;with call bell/phone within reach;with bed alarm set;with family/visitor present   PT Visit Diagnosis: Other abnormalities of gait and mobility (R26.89);Hemiplegia and hemiparesis;Other symptoms and signs involving the nervous system (R29.898) Hemiplegia - Right/Left: Right Hemiplegia - dominant/non-dominant: Dominant Hemiplegia - caused by: Cerebral infarction     Time: 1020-1048 PT Time Calculation (min) (ACUTE ONLY): 28 min  Charges:  $Therapeutic Activity: 23-37 mins                     Magda Kiel, [place] [phone] 12/31/2019    Reginia Naas 12/31/2019, 12:03 PM

## 2019-12-31 NOTE — Progress Notes (Signed)
ANTICOAGULATION CONSULT NOTE - [place] for Heparin Indication: atrial fibrillation  Allergies  Allergen Reactions  . Bee Venom Shortness Of Breath and Swelling    Bodily Swelling  . Losartan Other (See Comments)    Nosebleeds per patient report.   . Naproxen Other (See Comments)    Effects acid reflux  . Penicillins Nausea Only    Has patient had a PCN reaction causing immediate rash, facial/tongue/throat swelling, SOB or lightheadedness with hypotension: no Has patient had a PCN reaction causing severe rash involving mucus membranes or skin necrosis: no Has patient had a PCN reaction that required hospitalization no Has patient had a PCN reaction occurring within the last 10 years: no If all of the above answers are "NO", then may proceed with Cephalosporin   . Lisinopril     Sinus congestion    Patient Measurements: Height: 5\' 6"  (167.6 cm) Weight: 101 kg (222 lb 10.6 oz) IBW/kg (Calculated) : 59.3 Heparin Dosing Weight: 82.2 kg  Vital Signs: Temp: 98.2 F (36.8 C) (05/28 1600) Temp Source: Axillary (05/28 1600) BP: 112/76 (05/28 1700) Pulse Rate: 111 (05/28 1700)  Labs: Recent Labs    12/29/19 0440 12/29/19 0440 12/30/19 0617 12/31/19 0712 12/31/19 1637  HGB 12.2   < > 12.0 13.4  --   HCT 39.8  --  39.5 45.0  --   PLT 193  --  209 216  --   HEPARINUNFRC  --   --   --   --  0.56  CREATININE 0.87  --  1.11* 1.07*  --    < > = values in this interval not displayed.    Estimated Creatinine Clearance: 73 mL/min (A) (by C-G formula based on SCr of 1.07 mg/dL (H)).   Medical History: Past Medical History:  Diagnosis Date  . Allergy   . Anemia   . Arthritis   . CHF (congestive heart failure) ([place])    a. EF at 30-35% by echo in 08/2018 b. EF at 35% by repeat echo in 05/2019  . Chronic abdominal pain   . Cocaine abuse ([place])   . COPD (chronic obstructive pulmonary disease) ([place])   . Essential hypertension, benign   . Fatty liver    . GERD (gastroesophageal reflux disease)   . Gout 2016  . Normal coronary arteries    3/10 - following abnormal Myoview  . Ovarian cyst   . Type 2 diabetes mellitus [facility])     Assessment: 54 yr old female with a history of DM, HTN, atrial fibrillation on apixaban, anemia, CHF, COPD and polysubstance abuse was found unresponsive at home after family heard her fall. Pt was  admitted to [facility] for acute left MCA stroke and L ICA occlusion. She is now S/P mechanical thrombectomy on 5/24. Pharmacy was consulted to start IV heparin.   Date of pt's last apixaban dose is unclear; however, last fill date is 10/29/19, so patient may not have taken apixaban in the last month.  H/H, platelets remain WNL; neurological imaging is stable, no bleeding concerns.  Heparin level ~5.5 hrs after starting heparin infusion at 1050 units/hr is 0.56 units/ml, which is slightly above the goal range for this pt. Per RN, no issues with IV or bleeding observed.   Goal of Therapy:  Heparin level 0.3-0.5 units/ml Monitor platelets by anticoagulation protocol: Yes   Plan:  Decrease heparin infusion to 950 units/hr Check 6-hr heparin level Monitor daily heparin level, CBC Monitor for signs/symptoms of bleeding F/U  transition to oral anticoagulant once patient passes swallow evaluation  Gillermina Hu, PharmD, BCPS, [facility] Clinical Pharmacist 12/31/2019,5:21 PM

## 2019-12-31 NOTE — Progress Notes (Addendum)
STROKE TEAM PROGRESS NOTE   INTERVAL HISTORY Patient remains globally aphasic with right hemiplegia.  She is sitting up in bed.  She is scheduled for swallow eval later today.  Blood pressure adequately controlled.  Vital signs stable.  No neurological changes. CT scan from this morning shows stable large left MCA infarct and cytotoxic edema with 7 mm left-to-right shift Vitals:   12/31/19 0600 12/31/19 0700 12/31/19 0800 12/31/19 0821  BP: (!) 146/87 (!) 145/91    Pulse: 92 92  88  Resp: (!) 35 (!) 26  18  Temp:   98 F (36.7 C)   TempSrc:   Axillary   SpO2: 100% 100%  98%  Weight:      Height:       CBC:  Recent Labs  Lab 12/26/19 2254 12/26/19 2254 12/27/19 0305 12/27/19 0314 12/30/19 0617 12/31/19 0712  WBC 8.1   < > 7.9   < > 9.5 8.9  NEUTROABS 5.1  --  6.1  --   --   --   HGB 13.5   < > 13.3   < > 12.0 13.4  HCT 42.0   < > 41.6   < > 39.5 45.0  MCV 91.7   < > 91.6   < > 97.1 98.5  PLT 221   < > 217   < > 209 216   < > = values in this interval not displayed.   Basic Metabolic Panel:  Recent Labs  Lab 12/28/19 0443 12/28/19 0843 12/28/19 1855 12/29/19 0120 12/30/19 0617 12/31/19 0712  NA 141   < > 148*   < > 157* 161*  K 4.0  --   --    < > 3.5 4.0  CL 107  --   --    < > 120* 118*  CO2 25  --   --    < > 28 30  GLUCOSE 160*  --   --    < > 182* 145*  BUN 18  --   --    < > 30* 35*  CREATININE 1.07*  --   --    < > 1.11* 1.07*  CALCIUM 8.5*  --   --    < > 9.7 10.1  MG 2.2  --  2.1  --   --   --   PHOS 3.3  --  2.5  --   --   --    < > = values in this interval not displayed.   Lipid Panel:     Component Value Date/Time   CHOL 121 12/27/2019 0305   TRIG 128 12/27/2019 0305   HDL 27 (L) 12/27/2019 0305   CHOLHDL 4.5 12/27/2019 0305   VLDL 26 12/27/2019 0305   LDLCALC 68 12/27/2019 0305   HgbA1c:  Lab Results  Component Value Date   HGBA1C 11.6 (H) 12/27/2019   Urine Drug Screen:     Component Value Date/Time   LABOPIA NONE DETECTED  12/26/2019 2249   COCAINSCRNUR POSITIVE (A) 12/26/2019 2249   LABBENZ NONE DETECTED 12/26/2019 2249   AMPHETMU NONE DETECTED 12/26/2019 2249   THCU NONE DETECTED 12/26/2019 2249   LABBARB NONE DETECTED 12/26/2019 2249    Alcohol Level     Component Value Date/Time   ETH <10 12/26/2019 2255   IMAGING past 24 hours CT HEAD WO CONTRAST  Result Date: 12/31/2019 CLINICAL DATA:  Stroke follow-up EXAM: CT HEAD WITHOUT CONTRAST TECHNIQUE: Contiguous axial images were obtained from the base  of the skull through the vertex without intravenous contrast. COMPARISON:  Head CT 12/29/2019 FINDINGS: Brain: Unchanged appearance of left MCA territory infarct with cytotoxic edema causing leftward midline shift of 7 mm. Unchanged pattern of petechial hemorrhage. No intraparenchymal hematoma. No hydrocephalus. Vascular: No hyperdense vessel or unexpected calcification. Skull: Normal. Negative for fracture or focal lesion. Sinuses/Orbits: No acute finding. Other: None. IMPRESSION: 1. Unchanged appearance of left MCA territory infarct with petechial hemorrhage. 2. Unchanged leftward midline shift of 7 mm. Electronically Signed   By: Ulyses Jarred M.D.   On: 12/31/2019 03:34    PHYSICAL EXAM     Obese middle-aged African-American lady who is intubated and not sedated. . Afebrile. Head is nontraumatic. Neck is supple without bruit.    Cardiac exam no murmur or gallop. Lungs are clear to auscultation. Distal pulses are well felt. Neurological Exam :  Patient is awake with eyes open but is globally aphasic and does   follow occasional left body commands.  Slight left gaze preference.  Doll's eye movements are sluggish.  Pupils equal reactive.  Fundi could not be visualized.  Spontaneous left upper and lower extremity movements against gravity..  Right hemiplegia with right upper extremity flaccidity and hypotonia with no withdrawal even to painful stimuli.  Trace withdrawal in the right lower extremity to painful stimuli.   Deep tendon flexes are depressed on the right and present on the left.  Right plantar equivocal left downgoing.  Gait not tested.  ASSESSMENT/PLAN [name] is a 54 y.o. female with history of DM, HTN, atrial fibrillation on Eliquis, anemia, CHF, COPD and polysubstance abuse presenting to [facility] with R hemiparesis, unresponsive. Found to have L MCA infarct transferred to [facility] for thrombectomy .   Stroke:   L MCA infarct w/ petechial hemorrhage embolic in setting of cocaine use with AF on eliquis and cardiomyopathy  Code Stroke C T head evolving hypodensity L basal ganglia, L insula, L temporal lobe. ? Hyperdensity L ICA terminus. ASPECTS 6    CTA head & neck L ICA terminus occlusion w/ distal reconstitution mid and idstal L M1 w/ attenuated flow distally. Mild L ICA bifurcation atherosclerosis. Short severe proximal R V1 stenosis. 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   CT perfusion 24cc core infarct deep and subcortical white matter anterior L frontal lobe. 214cc penumbra.  Cerebral angio L ICA occlusion w/ no flow in ACA or MCA. TICI3 revascularization ACA and MCA. [facility])  Post IR CT no hemorrhage, no stain   MRI large patchy left MCA infarct with hemorrhagic petechial transformation.  Repeat CT 5/25 L MCA infarct w/ petechial hemorrhage, now w/ 8mm midline shift   Repeat CT 5/26 unchanged hemorrhage and shift  Repeat CT 5/28 unchanged L MCA infarct w/ petechial hemorrhage, unchanged 56mm midline shift   2D Echo decreased ejection fraction 30 to 35%.  No definite clot.  LDL 68  HgbA1c 11.6  Heparin 5000 units sq tid for VTE prophylaxis  Eliquis (apixaban) daily prior to admission, now on aspirin 81 mg daily. Today will start  IV heparin, stop aspirin. Will likely d/c on Eliquis pending compliance and ability to swallow  Therapy recommendations:  CIR  Disposition:  pending (lived with boyfriend PTA)  Transfer to the  floor  Acute Respiratory Failure  Intubated for IR, left intubated for airway protection   CXR streaky opacities favoring atx yest, today w/ post op atx  Extubated 5/27   CCM signed off  Cytotoxic Cerebral Edema  69mm midline shift seen on CT 5/25 am  Started 3% hypertonic saline protocol   Na 136-141-142-151-153-154-154-153-157-161  Allow Na to drift down  Do not resume 3%  Check Na daily  Repeat CT 5/26 unchanged hemorrhage and shift  Repeat CT 5/28 unchanged L MCA infarct w/ petechial hemorrhage, unchanged 11mm midline shift   Started on free water 200 q 6h  Atrial Fibrillation w/ RVR  Home anticoagulation:  Eliquis (apixaban) daily - last documented fill time was end of March 2021  On Coreg 50 bid, digoxin 0.125 daily PTA - resumed in hospital  Cardiomyopathy  2D Echo decreased ejection fraction 30 to 35%.  No definite clot.  Hypertension  Home meds:  Hydralazine 50 tid . BP goal < 180 . On coreg 50 bid, dig 0.125, torsemide 20, isosorbide-hydralazine 20-37.5 tid, prn labetalol . Long-term BP goal normotensive  Diabetes type II Uncontrolled  Home meds:  trulicity [id number] weekly, 0000000 30u bid, metformin 1000  HgbA1c 11.6, goal < 7.0  CBGs Recent Labs    12/30/19 2327 12/31/19 0430 12/31/19 0800  GLUCAP 173* 150* 145*      SSI (0-20)  Holding metformin  On launtus 60  On 2u TF coverage q4h  Dysphagia . Secondary to stroke . NPO . On TF via Cortrak . Speech on board   Other Stroke Risk Factors  Former Cigarette smoker  ETOH use, alcohol level <10, will advise to drink no more than 1 drink(s) a day. Family aware  Polysubstance abuse, hx cocaine and marijuana- UDS:  THC NEGATIVE, Cocaine POSITIVE. Will advise to stop using due to stroke risk. Family aware.  Obesity, Body mass index is 35.94 kg/m., recommend weight loss, diet and exercise as appropriate   Congestive heart failure, on torsemide 20 bid ->decrease to daily given slight  increase in Cre   Other Active Problems  Fatty liver  COPD  GERD  AKI 1.07    5x6x7 saccular parlinoid R ICA aneurysm  Hospital day # 4 Patient remains critically ill due to of large left MCA stroke and cytotoxic edema and midline shift.  Hypertonic saline has been off now for several days but serum sodium is gone up to 160 and agree with starting some free water through PEG tube.  Speech therapy to check swallow eval of.  Start IV heparin due to history of atrial fibrillation for stroke prevention.  Discontinue aspirin.  I discussed the risk benefit of anticoagulation in the setting of large stroke and atrial fibrillation with the patient's sister at the bedside and answered questions and she is in agreement with the plan.  Discussed with Dr. Kipp Brood critical care medicine.  May consider transfer to floor bed in the next day or 2 if stable This patient is critically ill and at significant risk of neurological worsening, death and care requires constant monitoring of vital signs, hemodynamics,respiratory and cardiac monitoring, extensive review of multiple databases, frequent neurological assessment, discussion with family, other specialists and medical decision making of high complexity.I have made any additions or clarifications directly to the above note.This critical care time does not reflect procedure time, or teaching time or supervisory time of PA/NP/Med Resident etc but could involve care discussion time.  I spent 30 minutes of neurocritical care time  in the care of  this patient.     Antony Contras, MD    To contact Stroke Continuity provider, please refer to [email or web address] After hours, contact General Neurology

## 2019-12-31 NOTE — Progress Notes (Addendum)
Inpatient Rehabilitation-Admissions Coordinator     I met with pt and her sister at the bedside as follow up from PM&R MD consult (Please see Dr. Florentina Jenny note from 5/26 for details) and to discuss goals and expectations of an inpatient rehab admission. Pt responsive to some of my verbal cues but also kept her eyes closed for most of my assessment. I mostly spoke to the pt's sister, [name], regarding CIR program. I explained the differences between CIR and SNF settings as family is familiar with SNF rehabs, and discussed anticipated LOS and expected assist needed at DC from [place]. Per [name], the family will work out 24/7 A at [employer] and can provide the anticipated Min A level; they would like to pursue CIR program at this time. Will need updated OT treatment note to open her insurance case. (please note pt's insurance company typically takes up to 5 days to process a request)  I have notified acute rehab on need for updated OT note.   Raechel Ache, OTR/L  Rehab Admissions Coordinator  [phone] 12/31/2019 11:34 AM

## 2019-12-31 NOTE — Progress Notes (Signed)
NAME:  Robin [name], MRN:  [id number], DOB:  Jun 23, 1966, LOS: 4 ADMISSION DATE:  12/26/2019, CONSULTATION DATE:  12/31/19 REFERRING MD:  Cheral Marker, CHIEF COMPLAINT:  Acute CVA   Brief History   54 year old female admitted with acute MCA CVA and left ICA LVO, taken emergently for thrombectomy and left intubated post procedure.  PCCM consulted for vent management.  History of present illness   54 year old female with a history of diabetes pretension, A. fib on Eliquis, heart failure, substance abuse who presented after a fall with right-sided weakness as a code stroke.  Found to have left MCA CVA and left ICA occlusion.  Taken emergently for thrombectomy and transferred to the ICU intubated.  PCCM consulted for vent management  Past Medical History   has a past medical history of Allergy, Anemia, Arthritis, CHF (congestive heart failure) ([place]), Chronic abdominal pain, Cocaine abuse ([place]), COPD (chronic obstructive pulmonary disease) ([place]), Essential hypertension, benign, Fatty liver, GERD (gastroesophageal reflux disease), Gout (2016), Normal coronary arteries, Ovarian cyst, and Type 2 diabetes mellitus ([place]).   Significant Hospital Events   5/24 Admit to neurology  Consults:  PCCM  Procedures:  5/24 ETT > 5/27  Significant Diagnostic Tests:  5/23 CT head >evolving hypodensity involving the left basal ganglia, left insula, and adjacent left temporal lobe, which could reflect an evolving left MCA territory infarct. Additionally, there is question of asymmetric hyperdensity at the left ICA terminus, which could reflect intraluminal thrombus/LVO MRI 5/24 > Evolving left MCA territory infarct with hemorrhagic transformation. Mass effect with 6 mm rightward midline shift. 7 mm paraclinoid right ICA aneurysm. Echo 5/24 > LVEF 35-45%. LV mild dilation. Global HK. Mod asymmetric LV hypertrophy. Both atria severely dilated.  CT head 5/24 > Acute left MCA territory infarct with petechial hemorrhage.  No evident progression from brain MRI yesterday. Midline shift measures 7 mm. CT head 5/26 > unchanged appearance of large left MCA infarct with petechial hemorrhage.  Micro Data:  5/24 Sars-Cov-2>>negative 5/24 UC>>  Antimicrobials:    Interim history/subjective:  Extubated yesterday with no difficulties with secretion management. Starting to initiate speech.   Objective   Blood pressure (!) 145/91, pulse 88, temperature 98 F (36.7 C), temperature source Axillary, resp. rate 18, height 5\' 6"  (1.676 m), weight 101 kg, last menstrual period 08/21/2011, SpO2 98 %.        Intake/Output Summary (Last 24 hours) at 12/31/2019 0850 Last data filed at 12/31/2019 0800 Gross per 24 hour  Intake 1046 ml  Output 1000 ml  Net 46 ml   Filed Weights   12/26/19 2243 12/28/19 0500 12/30/19 0403  Weight: 98.9 kg 100.8 kg 101 kg    General: adult female, obese, on vent HEENT: MM pink/moist, no transmitted upper airway sounds.  Neuro: Sedation off.  Moves left side to command. CV: IRIR, no M/R/G. PULM: Clear, no wheeze. GI: Soft, NT, ND. Extremities: No acute deformity or edema. Skin: no rashes or lesions.   Assessment & Plan:   Acute left MCA ischemic CVA with left ICA LVO s/p thrombectomy Management per neurology, SBP goal 120-130 R ICA aneurysm  - Starting resuvastatin - Increased Bidil to optimize BP control.   Was acute hypoxemic respirtory failure: in the post operative setting requiring sedation.  COPD without acute exacerbation.  - tolerated extubation.  Atrial fibrillation HTN Chronic HFrEF: LVEF 35% - Stroke service to decide timing of anticoagulation.  - Continue home coreg, digoxin, torsemide.  Iatrogenic hypernatremia - start free water to correct.  Type 2 diabetes - Holding metformin. - Continue SSI and lantus.  Nutrition  - Tube feeds. - SLP eval once extubated.  Extubated now.  Rest per primary team.  Nothing further to add.  PCCM will sign off.   Please call back if we can be of further assistance.  Best practice:  Diet: TF's. SLP eval once extubated. Pain/Anxiety/Delirium protocol (if indicated): N/A. VAP protocol (if indicated): HOB 30 degrees DVT prophylaxis: SCDs GI prophylaxis: Protonix Glucose control: SSI Mobility: Bedrest Code Status: Full code Family Communication: Per primary. Disposition: ICU  Kipp Brood, MD [facility] ICU Physician [place]  Pager: [phone] Mobile: [phone] After hours: [phone].  12/31/2019, 8:51 AM

## 2019-12-31 NOTE — Evaluation (Signed)
Clinical/Bedside Swallow Evaluation Patient Details  Name: Robin [name] MRN: [id number] Date of Birth: [birth year]  Today's Date: 12/31/2019 Time: SLP Start Time (ACUTE ONLY): 0936 SLP Stop Time (ACUTE ONLY): 0945 SLP Time Calculation (min) (ACUTE ONLY): 9 min  Past Medical History:  Past Medical History:  Diagnosis Date  . Allergy   . Anemia   . Arthritis   . CHF (congestive heart failure) ([place])    a. EF at 30-35% by echo in 08/2018 b. EF at 35% by repeat echo in 05/2019  . Chronic abdominal pain   . Cocaine abuse ([place])   . COPD (chronic obstructive pulmonary disease) ([place])   . Essential hypertension, benign   . Fatty liver   . GERD (gastroesophageal reflux disease)   . Gout 2016  . Normal coronary arteries    3/10 - following abnormal Myoview  . Ovarian cyst   . Type 2 diabetes mellitus ([place])    Past Surgical History:  Past Surgical History:  Procedure Laterality Date  . ABDOMINAL HYSTERECTOMY  09/10/2011   Procedure: HYSTERECTOMY ABDOMINAL;  Surgeon: Jonnie Kind, MD;  Location: [facility];  Service: Gynecology;  Laterality: N/A;  Abdominal hysterectomy  . CESAREAN SECTION  [id number], and 1994  . CHOLECYSTECTOMY  1995  . IR CT HEAD LTD  12/27/2019  . IR PERCUTANEOUS ART THROMBECTOMY/INFUSION INTRACRANIAL INC DIAG ANGIO  12/27/2019  . IR US GUIDE VASC ACCESS RIGHT  12/27/2019  . RADIOLOGY WITH ANESTHESIA N/A 12/27/2019   Procedure: IR WITH ANESTHESIA;  Surgeon: Luanne Bras, MD;  Location: [facility];  Service: Radiology;  Laterality: N/A;  . SCAR REVISION  09/10/2011   Procedure: SCAR REVISION;  Surgeon: Jonnie Kind, MD;  Location: [facility];  Service: Gynecology;  Laterality: N/A;  Wide Excision of old Cicatrix  . TUBAL LIGATION  1994   HPI:  Pt is a 54 year old female admitted with acute L MCA CVA and L ICA occlusion s/p thrombectomy. ETT 5/24-5/27. PMH includes: DM, Afib, CHF, polysubstance abuse, COPD, GERD, HTN   Assessment / Plan / Recommendation Clinical  Impression  Pt has right-sided weakness and suspected sensory changes that result in very mild R pocketing and mild-moderate anterior spillage. Coughing was noted only intermittently, but audible wetness is also noted as trials progress. [name] aphasia impacts her ability to cough to command or produce phonation to command to assess vocal quality. Given signs of neurologically-based dysphagia on top of an intubation of several days, recommend MBS to better evaluate oropharyngeal swallow.   SLP Visit Diagnosis: Dysphagia, oropharyngeal phase (R13.12)    Aspiration Risk  Moderate aspiration risk    Diet Recommendation NPO;Other (Comment)(few ice chips after oral care)   Medication Administration: Via alternative means    Other  Recommendations Oral Care Recommendations: Oral care QID Other Recommendations: Have oral suction available   Follow up Recommendations Inpatient Rehab      Frequency and Duration            Prognosis Prognosis for Safe Diet Advancement: Good      Swallow Study   General HPI: Pt is a 54 year old female admitted with acute L MCA CVA and L ICA occlusion s/p thrombectomy. ETT 5/24-5/27. PMH includes: DM, Afib, CHF, polysubstance abuse, COPD, GERD, HTN Type of Study: Bedside Swallow Evaluation Previous Swallow Assessment: none in chart Diet Prior to this Study: NPO;NG Tube Temperature Spikes Noted: No Respiratory Status: Nasal cannula History of Recent Intubation: Yes Length of Intubations (days): 3 days Date extubated: 12/30/19 Behavior/Cognition: Alert;Cooperative;Requires cueing;Other (  Comment)(aphasic) Oral Cavity Assessment: Within Functional Limits Oral Care Completed by SLP: Yes Oral Cavity - Dentition: Adequate natural dentition Vision: Functional for self-feeding Self-Feeding Abilities: Able to feed self Patient Positioning: Upright in bed Baseline Vocal Quality: Normal Volitional Cough: Cognitively unable to elicit Volitional Swallow: Unable to  elicit    Oral/Motor/Sensory Function Overall Oral Motor/Sensory Function: (trouble following commands but definite R sided impairment)   Ice Chips Ice chips: Within functional limits Presentation: Spoon   Thin Liquid Thin Liquid: Impaired Presentation: Cup;Self Fed;Spoon;Straw Oral Phase Impairments: Reduced labial seal;Poor awareness of bolus Oral Phase Functional Implications: Right anterior spillage Pharyngeal  Phase Impairments: Cough - Immediate;Decreased hyoid-laryngeal movement    Nectar Thick Nectar Thick Liquid: Not tested   Honey Thick Honey Thick Liquid: Not tested   Puree Puree: Impaired Presentation: Self Fed;Spoon Oral Phase Impairments: Reduced labial seal;Poor awareness of bolus Oral Phase Functional Implications: Right anterior spillage;Right lateral sulci pocketing(minimal pocketing)   Solid     Solid: Not tested       Osie Bond., M.A. [place] Pager [phone] Office [phone]  12/31/2019,10:21 AM

## 2019-12-31 NOTE — Progress Notes (Signed)
ANTICOAGULATION CONSULT NOTE - Initial Consult  Pharmacy Consult for heparin Indication: atrial fibrillation  Allergies  Allergen Reactions  . Bee Venom Shortness Of Breath and Swelling    Bodily Swelling  . Losartan Other (See Comments)    Nosebleeds per patient report.   . Naproxen Other (See Comments)    Effects acid reflux  . Penicillins Nausea Only    Has patient had a PCN reaction causing immediate rash, facial/tongue/throat swelling, SOB or lightheadedness with hypotension: no Has patient had a PCN reaction causing severe rash involving mucus membranes or skin necrosis: no Has patient had a PCN reaction that required hospitalization no Has patient had a PCN reaction occurring within the last 10 years: no If all of the above answers are "NO", then may proceed with Cephalosporin   . Lisinopril     Sinus congestion    Patient Measurements: Height: 5\' 6"  (167.6 cm) Weight: 101 kg (222 lb 10.6 oz) IBW/kg (Calculated) : 59.3 Heparin Dosing Weight: 82.2 kg  Vital Signs: Temp: 98 F (36.7 C) (05/28 0800) Temp Source: Axillary (05/28 0800) BP: 147/100 (05/28 1300) Pulse Rate: 104 (05/28 1300)  Labs: Recent Labs    12/29/19 0440 12/29/19 0440 12/30/19 0617 12/31/19 0712  HGB 12.2   < > 12.0 13.4  HCT 39.8  --  39.5 45.0  PLT 193  --  209 216  CREATININE 0.87  --  1.11* 1.07*   < > = values in this interval not displayed.    Estimated Creatinine Clearance: 73 mL/min (A) (by C-G formula based on SCr of 1.07 mg/dL (H)).   Medical History: Past Medical History:  Diagnosis Date  . Allergy   . Anemia   . Arthritis   . CHF (congestive heart failure) ([place])    a. EF at 30-35% by echo in 08/2018 b. EF at 35% by repeat echo in 05/2019  . Chronic abdominal pain   . Cocaine abuse ([place])   . COPD (chronic obstructive pulmonary disease) ([place])   . Essential hypertension, benign   . Fatty liver   . GERD (gastroesophageal reflux disease)   . Gout 2016  . Normal coronary  arteries    3/10 - following abnormal Myoview  . Ovarian cyst   . Type 2 diabetes mellitus (H[facility])     Medications:  Scheduled:  .  stroke: mapping our early stages of recovery book   Does not apply Once  . aspirin  81 mg Per Tube Daily  . carvedilol  50 mg Per Tube BID WC  . chlorhexidine  15 mL Mouth Rinse BID  . Chlorhexidine Gluconate Cloth  6 each Topical Daily  . digoxin  0.125 mg Per Tube Daily  . docusate  100 mg Per Tube BID  . feeding supplement (PRO-STAT SUGAR FREE 64)  30 mL Per Tube Daily  . free water  200 mL Per Tube Q6H  . gabapentin  100 mg Per Tube Q8H  . insulin aspart  0-20 Units Subcutaneous Q4H  . insulin aspart  2 Units Subcutaneous Q4H  . insulin glargine  60 Units Subcutaneous QHS  . ipratropium-albuterol  3 mL Nebulization BID  . isosorbide-hydrALAZINE  2 tablet Per Tube TID  . mouth rinse  15 mL Mouth Rinse q12n4p  . multivitamin with minerals  1 tablet Per Tube Daily  . pantoprazole sodium  40 mg Per Tube Daily  . polyethylene glycol  17 g Per Tube Daily  . potassium chloride  20 mEq Per Tube Daily  .  rosuvastatin  20 mg Per Tube Daily  . sodium chloride flush  10-40 mL Intracatheter Q12H  . torsemide  20 mg Per Tube Daily   Infusions:  . sodium chloride    . feeding supplement (GLUCERNA 1.2 CAL) 60 mL/hr at 12/31/19 0900  . heparin 1,050 Units/hr (12/31/19 1100)    Assessment: 54 y.o. female with a history of DM, HTN, atrial fibrillation on Eliquis, anemia, CHF, COPD and polysubstance abuse, found unresponsive at home after family heard her fall. Pt admitted to [facility] for acute left MCA stroke and L ICA occlusion, is now s/p mechanical thrombectomy on 5/24. Pharmacy has been consulted to start IV heparin.   Unclear when patient's last dose of apixaban was, however, last fill date is 10/29/19, thus patient may not have taken apixaban in the last month.  Hg 13.4, plts 216. All neurological imaging is stable. No bleeding concerns.   Goal of Therapy:   Heparin level 0.3-0.5 units/ml Monitor platelets by anticoagulation protocol: Yes   Plan:  Start heparin infusion at 1050 units/hr Check anti-Xa level in 6 hours and daily while on heparin Continue to monitor H&H and platelets  Watch for signs/symptoms of bleeding F/u transition to oral anticoagulant once patient passes swallow evaluation  Sherren Kerns, PharmD PGY1 Acute Care Pharmacy Resident 12/31/2019,2:54 PM

## 2019-12-31 NOTE — Progress Notes (Addendum)
Was called to room b/c patient is very lethargic during OT assessment.  I explained it was likely due to very busy morning with PT, SLP, barium swallow, MD visits, and a bath. While in the room pt was noted to be having long apneic periods which had not happened all morning.  I asked Dr Lynetta Mare and was told to continue to monitor but maybe consider CPAP at night. I returned to the pt room to monitor and specifically time apneic periods which pretty regularly lasted 20 seconds with normal breathing in between for around a minute. I have paged primary MD, Dr. Leonie Man, to let him know of new pt status. DUPONT, CONSTANCE C 2:47 PM  Spoke with Dr. Leonie Man about apneic periods. He said to continue to monitor and, if her neurologic status changes, to page him.

## 2019-12-31 NOTE — Progress Notes (Signed)
Occupational Therapy Treatment Patient Details Name: Robin [name] MRN: [id number] DOB: 10-18-1965 Today's Date: 12/31/2019    History of present illness 54 y.o. female with a history of DM, HTN, atrial fibrillation on Eliquis, anemia, CHF, COPD and polysubstance abuse, presenting initially to [facility] ED after being found unresponsive at home after family heard her fall. Pt transferred/admitted to [facility] for acute left MCA stroke and L ICA occlusion, is now s/p mechanical thrombectomy on 5/24, pt kept intubated post procedure.    OT comments  Pt with back to back treatments since 8am this morning per RN without rest breaks. Pt has had continued activity from 8am until 1:30 today. Pt very sleepy and difficult to arouse on OT arrival. Pt continued to sleep even in upright position with bed in egress. OT to attempt earlier in the day next session to update notes for CIR insurance. RN at bedside addressing noted pauses in RR during sleep.    Follow Up Recommendations  CIR;Supervision/Assistance - 24 hour    Equipment Recommendations  Other (comment)    Recommendations for Other Services Rehab consult    Precautions / Restrictions Precautions Precautions: Fall Precaution Comments: R hemi       Mobility Bed Mobility Overal bed mobility: Needs Assistance Bed Mobility: Supine to Sit     Supine to sit: HOB elevated;Mod assist;+2 for physical assistance     General bed mobility comments: total (A)   Transfers Overall transfer level: Needs assistance   Transfers: Stand Pivot Transfers;Sit to/from Stand Sit to Stand: Max assist;+2 physical assistance         General transfer comment: not appropriate at this time    Balance Overall balance assessment: Needs assistance Sitting-balance support: Feet supported Sitting balance-Leahy Scale: Poor Sitting balance - Comments: mod to min A for balance leaning R at times, after standing able to sit with S for brief moment   Standing  balance support: Single extremity supported Standing balance-Leahy Scale: Zero Standing balance comment: stood x about 30 seconds then second time for about a minute and some to take steps toward [facility]                           ADL either performed or assessed with clinical judgement   ADL Overall ADL's : Needs assistance/impaired     Grooming: Wash/dry face;Bed level                                 General ADL Comments: total (A) for all adls. Pt sleeping very soundly with moments of apnea showing on the screen for 5-6 seconds RN called to room adn made aware. Pt placed in chair position with egress to attempt to arouse with upright posture. pt elevated from bed surface without any righting reaction. pt pushing head back to the L with attempts to hold in midline.      Vision       Perception     Praxis      Cognition Arousal/Alertness: Lethargic Behavior During Therapy: Flat affect Overall Cognitive Status: Difficult to assess                                          Exercises Other Exercises Other Exercises: PROM R LE in supine   Shoulder Instructions  General Comments 5-6 second pause with RR    Pertinent Vitals/ Pain       Pain Assessment: No/denies pain Faces Pain Scale: No hurt  Home Living                                          Prior Functioning/Environment              Frequency  Min 2X/week        Progress Toward Goals  OT Goals(current goals can now be found in the care plan section)  Progress towards OT goals: Progressing toward goals  Acute Rehab OT Goals Patient Stated Goal: pt unable to state OT Goal Formulation: Patient unable to participate in goal setting Time For Goal Achievement: 01/11/20 Potential to Achieve Goals: Fair ADL Goals Pt Will Perform Grooming: sitting;with min assist Pt Will Perform Upper Body Bathing: with min assist;sitting Pt/caregiver will  Perform Home Exercise Program: Increased strength;Increased ROM;Both right and left upper extremity;With minimal assist;With written HEP provided Additional ADL Goal #1: Pt will perform bed mobillity with modA as precursor to EOB/OOB ADL. Additional ADL Goal #2: Pt will tolerate sitting EOB >5 min with minA as precursor to ADL.  Plan Discharge plan remains appropriate    Co-evaluation                 AM-PAC OT "6 Clicks" Daily Activity     Outcome Measure   Help from another Gottsch eating meals?: Total Help from another Dorval taking care of personal grooming?: Total Help from another Papesh toileting, which includes using toliet, bedpan, or urinal?: Total Help from another Santoli bathing (including washing, rinsing, drying)?: Total Help from another Raglin to put on and taking off regular upper body clothing?: Total Help from another Mikels to put on and taking off regular lower body clothing?: Total 6 Click Score: 6    End of Session    OT Visit Diagnosis: Other abnormalities of gait and mobility (R26.89);Muscle weakness (generalized) (M62.81);Hemiplegia and hemiparesis Hemiplegia - Right/Left: Right Hemiplegia - caused by: Cerebral infarction   Activity Tolerance Patient tolerated treatment well   Patient Left in bed;with call bell/phone within reach;with nursing/sitter in room;with bed alarm set   Nurse Communication Mobility status;Precautions        Time: 1416(1416)-1430 OT Time Calculation (min): 14 min  Charges: OT General Charges $OT Visit: 1 Visit OT Treatments $Self Care/Home Management : 8-22 mins   Brynn, OTR/L  Acute Rehabilitation Services Pager: [phone] Office: [phone] .    Jeri Modena 12/31/2019, 3:17 PM

## 2019-12-31 NOTE — Progress Notes (Signed)
Pt transferred from 4N ICU s/p stroke, pt alert but non verbal, eyes open, settled in bed with call light at bedside, tele monitor put and verified, safety concern initiated as well, pt was reassured and will continue to monitor. Obasogie-Asidi, Robin [name]

## 2019-12-31 NOTE — Progress Notes (Signed)
Modified Barium Swallow Progress Note  Patient Details  Name: Robin [name] MRN: [id number] Date of Birth: 08/11/1965  Today's Date: 12/31/2019  Modified Barium Swallow completed.  Full report located under Chart Review in the Imaging Section.  Brief recommendations include the following:  Clinical Impression  Pt has a moderate oropharyngeal dysphagia with motor and sensory impairments. Suspect that this is largely secondary to acute CVA, although it is possible that some of her decreased laryngeal closure and sensory changes could be related to intubation. She has right-sided anterior spillage, transient oral holding, and sluggish transit toward her pharynx. Thin and nectar thick liquids spill into her airway prior to swallow initiation given oral deficits combined with impaired timing. At times this is immediately aspirated. When penetration occurs, she does not have good laryngeal closure to eject penetrates, so they continue to reach further until they have been aspirated. Aspiration is silent except for one instance in which she aspirated a more substantial volume of thin liquids, and pt does not cough or use strategies given her level of aphasia. Pt has better airway protection with honey thick liquids, but there was still a small amount of silent aspiration after the swallow. This occurred when flouro was turned off, so unsure if this was from mild vallecular residue but suspect more likely related to penetration that did not clear. Recommend starting Dys 1 diet and pudding thick liquids (nothing thinner than a puree) with SLP f/u for tolerance with repeat testing likely indicated prior to diet advancements.    Swallow Evaluation Recommendations       SLP Diet Recommendations: Dysphagia 1 (Puree) solids;Pudding thick liquid   Liquid Administration via: Spoon   Medication Administration: Crushed with puree   Supervision: Patient able to self feed;Full supervision/cueing for  compensatory strategies   Compensations: Slow rate;Small sips/bites   Postural Changes: Seated upright at 90 degrees   Oral Care Recommendations: Oral care BID   Other Recommendations: Order thickener from pharmacy;Prohibited food (jello, ice cream, thin soups);Remove water pitcher;Have oral suction available     Osie Bond., M.A. [place] Acute Rehabilitation Services Pager [phone] Office [phone]  12/31/2019,1:58 PM
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
## 2020-01-01 DIAGNOSIS — I4819 Other persistent atrial fibrillation: Secondary | ICD-10-CM

## 2020-01-01 LAB — BASIC METABOLIC PANEL
Anion gap: 17 — ABNORMAL HIGH (ref 5–15)
BUN: 43 mg/dL — ABNORMAL HIGH (ref 6–20)
CO2: 29 mmol/L (ref 22–32)
Calcium: 10.2 mg/dL (ref 8.9–10.3)
Chloride: 112 mmol/L — ABNORMAL HIGH (ref 98–111)
Creatinine, Ser: 1.29 mg/dL — ABNORMAL HIGH (ref 0.44–1.00)
GFR calc Af Amer: 55 mL/min — ABNORMAL LOW (ref >60)
GFR calc non Af Amer: 47 mL/min — ABNORMAL LOW (ref >60)
Glucose, Bld: 142 mg/dL — ABNORMAL HIGH (ref 70–99)
Potassium: 3.2 mmol/L — ABNORMAL LOW (ref 3.5–5.1)
Sodium: 158 mmol/L — ABNORMAL HIGH (ref 135–145)

## 2020-01-01 LAB — APTT
aPTT: 56 seconds — ABNORMAL HIGH (ref 24–36)
aPTT: 52 seconds — ABNORMAL HIGH (ref 24–36)

## 2020-01-01 LAB — GLUCOSE, CAPILLARY
Glucose-Capillary: 120 mg/dL — ABNORMAL HIGH (ref 70–99)
Glucose-Capillary: 253 mg/dL — ABNORMAL HIGH (ref 70–99)
Glucose-Capillary: 142 mg/dL — ABNORMAL HIGH (ref 70–99)
Glucose-Capillary: 184 mg/dL — ABNORMAL HIGH (ref 70–99)

## 2020-01-01 LAB — HEPARIN LEVEL (UNFRACTIONATED)
Heparin Unfractionated: 0.41 IU/mL (ref 0.30–0.70)
Heparin Unfractionated: 0.55 IU/mL (ref 0.30–0.70)
Heparin Unfractionated: 0.84 IU/mL — ABNORMAL HIGH (ref 0.30–0.70)

## 2020-01-01 MED ORDER — POTASSIUM CHLORIDE 20 MEQ PO PACK
40.0000 meq | PACK | Freq: Every day | ORAL | Status: DC
  Administered 2020-01-02 – 2020-01-05 (×4): 40 meq
  Filled 2020-01-01 (×4): qty 2

## 2020-01-01 NOTE — Progress Notes (Signed)
MD paged for yellow MEWs score due to patient being slightly hypotensive with a  BP 99/72 and HR of 104. MD with verbal orders to start 1 Liter bolus. Will continue to monitor Robin Grant, RN

## 2020-01-01 NOTE — Progress Notes (Signed)
ANTICOAGULATION CONSULT NOTE - Follow Up Consult  Pharmacy Consult for heparin Indication: atrial fibrillation in the setting of left MCA infarct   Labs: Recent Labs    12/30/19 0617 12/31/19 0712 12/31/19 1637 12/31/19 2342 01/01/20 0323 01/01/20 0810 01/01/20 1648  HGB 12.0 13.4  --   --   --   --   --   HCT 39.5 45.0  --   --   --   --   --   PLT 209 216  --   --   --   --   --   APTT  --   --   --   --   --  56* 52*  HEPARINUNFRC  --   --    < > 0.84*  --  0.55 0.41  CREATININE 1.11* 1.07*  --   --  1.29*  --   --    < > = values in this interval not displayed.    Assessment: 53yo female subtherapeutic on heparin based on aPTT (56) with higher heparin level (0.55) after decrease in rate; no gtt issues or signs of bleeding per RN.  Based on discordance between aPTT and heparin level. Will adjust based on aPTT due to possible interference with apixaban dosing PTA with heparin level.   Repeat labs this evening continue to show variance in aptt and heparin levels with aptt at 52s and HL 0.41 both lower after rate increase? Will make small rate adjustment to try and obtain therapeutic aptt but if heparin level consistent in am would dose adjust based on heparin levels only. Unsure when last apixaban dose was but would have to have been before 5/25.   Goal of Therapy:  Heparin level 0.3-0.5 units/ml  APTT 66-85   Plan:  Increase heparin to 1000 units/hr Aptt and heparin level in am  Erin Hearing PharmD., BCPS Clinical Pharmacist 01/01/2020 5:34 PM

## 2020-01-01 NOTE — Progress Notes (Signed)
ANTICOAGULATION CONSULT NOTE - Follow Up Consult  Pharmacy Consult for heparin Indication: atrial fibrillation in the setting of left MCA infarct   Labs: Recent Labs    12/29/19 0440 12/29/19 0440 12/30/19 0617 12/31/19 0712 12/31/19 1637 12/31/19 2342  HGB 12.2   < > 12.0 13.4  --   --   HCT 39.8  --  39.5 45.0  --   --   PLT 193  --  209 216  --   --   HEPARINUNFRC  --   --   --   --  0.56 0.84*  CREATININE 0.87  --  1.11* 1.07*  --   --    < > = values in this interval not displayed.    Assessment: 54yo female supratherapeutic on heparin with higher heparin level despite decreased rate; no gtt issues or signs of bleeding per RN.  Goal of Therapy:  Heparin level 0.3-0.5 units/ml   Plan:  Will decrease heparin gtt by 3 units/kgABW/hr to 750 units/hr and check level in 6 hours; will also check PTT since last dose of apixaban is unclear.    Wynona Neat, PharmD, BCPS  01/01/2020,12:43 AM

## 2020-01-01 NOTE — Progress Notes (Signed)
STROKE TEAM PROGRESS NOTE   INTERVAL HISTORY Daughter is at bedside today, I answered all questions, patient stable, not following verbal commands or commands with visual cues, she is alert and awake however.   Vitals:   01/01/20 0729 01/01/20 0806 01/01/20 1133 01/01/20 1139  BP:   (!) 84/53 (!) 87/59  Pulse:  74 71 62  Resp:  16 16   Temp:  (!) 97.4 F (36.3 C) 98.3 F (36.8 C)   TempSrc:  Axillary Oral   SpO2: 98% 100% 97% 97%  Weight:      Height:       CBC:  Recent Labs  Lab 12/26/19 2254 12/26/19 2254 12/27/19 0305 12/27/19 0314 12/30/19 0617 12/31/19 0712  WBC 8.1   < > 7.9   < > 9.5 8.9  NEUTROABS 5.1  --  6.1  --   --   --   HGB 13.5   < > 13.3   < > 12.0 13.4  HCT 42.0   < > 41.6   < > 39.5 45.0  MCV 91.7   < > 91.6   < > 97.1 98.5  PLT 221   < > 217   < > 209 216   < > = values in this interval not displayed.   Basic Metabolic Panel:  Recent Labs  Lab 12/28/19 0443 12/28/19 0843 12/28/19 1855 12/29/19 0120 12/31/19 0712 01/01/20 0323  NA 141   < > 148*   < > 161* 158*  K 4.0  --   --    < > 4.0 3.2*  CL 107  --   --    < > 118* 112*  CO2 25  --   --    < > 30 29  GLUCOSE 160*  --   --    < > 145* 142*  BUN 18  --   --    < > 35* 43*  CREATININE 1.07*  --   --    < > 1.07* 1.29*  CALCIUM 8.5*  --   --    < > 10.1 10.2  MG 2.2  --  2.1  --   --   --   PHOS 3.3  --  2.5  --   --   --    < > = values in this interval not displayed.   Lipid Panel:     Component Value Date/Time   CHOL 121 12/27/2019 0305   TRIG 128 12/27/2019 0305   HDL 27 (L) 12/27/2019 0305   CHOLHDL 4.5 12/27/2019 0305   VLDL 26 12/27/2019 0305   LDLCALC 68 12/27/2019 0305   HgbA1c:  Lab Results  Component Value Date   HGBA1C 11.6 (H) 12/27/2019   Urine Drug Screen:     Component Value Date/Time   LABOPIA NONE DETECTED 12/26/2019 2249   COCAINSCRNUR POSITIVE (A) 12/26/2019 2249   LABBENZ NONE DETECTED 12/26/2019 2249   AMPHETMU NONE DETECTED 12/26/2019 2249   THCU  NONE DETECTED 12/26/2019 2249   LABBARB NONE DETECTED 12/26/2019 2249    Alcohol Level     Component Value Date/Time   ETH <10 12/26/2019 2255    IMAGING past 24 hours No results found.  Neurologic exam: Patient is alert and awake, globally aphasic, not consistently following exams with verbal or visual cues, she did move her left arm however when I asked her to "give me a thumbs up" however very unclear if that was in response to my command, she  has a left gaze preference, pupils are equally reactive to light, round, could not examine fundi due to noncooperation, right hemianopia, right-sided neglect, left-sided gaze preference does not cross the midline, right lower facial weakness, hearing appears intact to voice, right hemiplegia, moving the left side spontaneously against gravity, withdraws only on the left to pain, gait not tested.   ASSESSMENT/PLAN [name] is a 54 y.o. female with history of DM, HTN, atrial fibrillation on Eliquis, anemia, CHF, COPD and polysubstance abuse presenting to [facility] with R hemiparesis, unresponsive. Found to have L MCA infarct transferred to [facility]. [facility] for thrombectomy .   Stroke:   L MCA infarct w/ petechial hemorrhage embolic in setting of cocaine use with AF on eliquis and cardiomyopathy  Code Stroke C T head evolving hypodensity L basal ganglia, L insula, L temporal lobe. ? Hyperdensity L ICA terminus. ASPECTS 6    CTA head & neck L ICA terminus occlusion w/ distal reconstitution mid and idstal L M1 w/ attenuated flow distally. Mild L ICA bifurcation atherosclerosis. Short severe proximal R V1 stenosis. 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   CT perfusion 24cc core infarct deep and subcortical white matter anterior L frontal lobe. 214cc penumbra.  Cerebral angio L ICA occlusion w/ no flow in ACA or MCA. TICI3 revascularization ACA and MCA. Earleen Newport)  Post IR CT no hemorrhage, no stain   MRI large  patchy left MCA infarct with hemorrhagic petechial transformation.  Repeat CT 5/25 L MCA infarct w/ petechial hemorrhage, now w/ 61mm midline shift   Repeat CT 5/26 unchanged hemorrhage and shift  Repeat CT 5/28 - unchanged L MCA infarct w/ petechial hemorrhage, unchanged 74mm midline shift   2D Echo decreased ejection fraction 30 to 35%.  No definite clot.  LDL 68  HgbA1c 11.6  Heparin 5000 units sq tid for VTE prophylaxis  Eliquis (apixaban) daily prior to admission, now on aspirin 81 mg daily. On  IV heparin, stop aspirin. Will likely d/c on Eliquis pending compliance and ability to swallow  Therapy recommendations:  CIR planned  Disposition:  pending (lived with boyfriend PTA)  Acute Respiratory Failure  Intubated for IR, left intubated for airway protection, now extubated  CXR streaky opacities favoring atx yest, today w/ post op atx  Extubated 5/27   CCM signed off   Cytotoxic Cerebral Edema  79mm midline shift seen on CT 5/25 am  Started 3% hypertonic saline protocol -> off  Na [id number]-161->158   Started on free water 200 q 6h  Allow Na to drift down  Do not resume 3%  Check Na daily  Repeat CT 5/26 unchanged hemorrhage and shift  Repeat CT 5/28 unchanged L MCA infarct w/ petechial hemorrhage, unchanged 64mm midline shift    Atrial Fibrillation w/ RVR  Home anticoagulation:  Eliquis (apixaban) daily - last documented fill time was end of March 2021  On Coreg 50 bid, digoxin 0.125 daily PTA - resumed in hospital  Now on Heparin IV - Eliquis planned when pt can swallow meds.  Cardiomyopathy  2D Echo decreased ejection fraction 30 to 35%.  No definite clot.  Demadex ; Coreg ; Bidil ; digoxin  Hypertension  Home meds:  Hydralazine 50 tid . BP goal < 180 . On coreg 50 bid, torsemide 20, isosorbide-hydralazine 20-37.5 tid, prn labetalol . Long-term BP goal normotensive  Diabetes type II Uncontrolled  Home meds:   trulicity [id number] weekly, 0000000 30u bid, metformin 1000  HgbA1c 11.6,  goal < 7.0  CBGs Recent Labs    12/31/19 2335 01/01/20 0332 01/01/20 1138  GLUCAP 164* 120* 253*      SSI (0-20)  Holding metformin  On launtus 60  On 2u TF coverage q4h  Dysphagia . Secondary to stroke . NPO . On TF via Cortrak . Speech on board . Swallowing study - pending    Other Stroke Risk Factors  Former Cigarette smoker  ETOH use, alcohol level <10, will advise to drink no more than 1 drink(s) a day. Family aware  Polysubstance abuse, hx cocaine and marijuana- UDS:  THC NEGATIVE, Cocaine POSITIVE. Will advise to stop using due to stroke risk. Family aware.  Obesity, Body mass index is 34.87 kg/m., recommend weight loss, diet and exercise as appropriate   Congestive heart failure, on torsemide 20 bid ->decrease to daily given slight increase in Cre   Other Active Problems  Fatty liver  COPD  GERD  AKI 1.07->1.29 (on Demadex daily)  5x6x7 saccular parlinoid R ICA aneurysm  Hypokalemia - 3.2 -> increase daily [facility] day # 5   Personally examined patient and images, and have participated in and made any corrections needed to history, physical, neuro exam,assessment and plan as stated above.  I have personally obtained the history, evaluated lab date, reviewed imaging studies and agree with radiology interpretations.    Sarina Ill, MD Stroke Neurology   A total of 25 minutes was spent for the care of this patient, spent on counseling patient and family on different diagnostic and therapeutic options, counseling and coordination of care, riskd ans benefits of management, compliance, or risk factor reduction and education.    To contact Stroke Continuity provider, please refer to [email or web address] After hours, contact General Neurology

## 2020-01-01 NOTE — Progress Notes (Signed)
ANTICOAGULATION CONSULT NOTE - Follow Up Consult  Pharmacy Consult for heparin Indication: atrial fibrillation in the setting of left MCA infarct   Labs: Recent Labs    12/30/19 0617 12/31/19 0712 12/31/19 1637 12/31/19 2342 01/01/20 0323 01/01/20 0810  HGB 12.0 13.4  --   --   --   --   HCT 39.5 45.0  --   --   --   --   PLT 209 216  --   --   --   --   APTT  --   --   --   --   --  56*  HEPARINUNFRC  --   --  0.56 0.84*  --  0.55  CREATININE 1.11* 1.07*  --   --  1.29*  --     Assessment: 53yo female subtherapeutic on heparin based on aPTT (56) with higher heparin level (0.55) after decrease in rate; no gtt issues or signs of bleeding per RN.  Based on discordance between aPTT and heparin level. Will adjust based on aPTT due to possible interference with apixaban dosing PTA with heparin level. APTT is low, therefore will increase heparin slightly.   Goal of Therapy:  Heparin level 0.3-0.5 units/ml  APTT 66-85   Plan:  Will increase heparin gtt by ~1 units/kg/hr to 850 units/hr and check level in 6 hours; will also check PTTs since last dose of apixaban is unclear.    Taevin Mcferran A. Levada Dy, PharmD, BCPS, FNKF Clinical Pharmacist [place] Please utilize Amion for appropriate phone number to reach the unit pharmacist ([place])   01/01/2020,10:29 AM

## 2020-01-01 NOTE — Progress Notes (Signed)
Physical Therapy Treatment Patient Details Name: Robin [name] MRN: [id number] DOB: Sep 05, 1965 Today's Date: 01/01/2020    History of Present Illness 54 y.o. female with a history of DM, HTN, atrial fibrillation on Eliquis, anemia, CHF, COPD and polysubstance abuse, presenting initially to [facility] ED after being found unresponsive at home after family heard her fall. Pt transferred/admitted to [facility] for acute left MCA stroke and L ICA occlusion, is now s/p mechanical thrombectomy on 5/24, pt kept intubated post procedure.     PT Comments    The pt continues to progress with mobility. Pt was more alert today and following commands. Continues to need cues for safety/waiting for assistance as she presents as impulsive at times. Continues to need up to max assist for mobility, however 2 [place] assist was not used today vs with previous sessions. The PT recommendation for CIR remains appropriate to maximize pt's functional mobility prior to discharge home with family assistance.     Follow Up Recommendations  CIR;Supervision/Assistance - 24 hour     Equipment Recommendations  Hospital bed;Wheelchair (measurements PT);Wheelchair cushion (measurements PT);3in1 (PT)    Recommendations for Other Services Rehab consult     Precautions / Restrictions Precautions Precautions: Fall Precaution Comments: R hemi Restrictions Weight Bearing Restrictions: No    Mobility  Bed Mobility Overal bed mobility: Needs Assistance Bed Mobility: Supine to Sit     Supine to sit: Max assist;HOB elevated     General bed mobility comments: with HOB 45 degrees pt able to advance her left LE to edge of bed/off edge of bed. pt was able to use her left arm to assist with trunk elevation. max assist with PTA hand at shoulders and other hand using pads under pt to advance hips to complete transfer from supine to sitting at edge of bed. max assist with pads under pt to fully scoot to edge of bed for feet to touch  floor.  Transfers Overall transfer level: Needs assistance   Transfers: Sit to/from Stand;Lateral/Scoot Transfers Sit to Stand: Max assist;Total assist        Lateral/Scoot Transfers: Mod assist;Max assist General transfer comment: stood x1 rep at edge of bed with pt holding PTA with left UE, right knee blocked with bed elevated and max to total assist. pt able to bear weight on left LE and pulled with left UE, no movements/assist noted on right side. once seated at edge of bed performed lateral scooting toward left side with pads under pt used to facilitate pelvic movements, pt able to use left side to assist with varied assist of mod to max assist from PTA. once in chair pt was able to scoot hips back with min assist on right side, none on left.  Wheelchair Mobility    Modified Rankin (Stroke Patients Only) Modified Rankin (Stroke Patients Only) Pre-Morbid Rankin Score: No symptoms Modified Rankin: Severe disability     Balance Overall balance assessment: Needs assistance Sitting-balance support: Feet supported;Single extremity supported Sitting balance-Leahy Scale: Fair Sitting balance - Comments: pt initially needed min assist for unsupported sitting at edge of bed with left UE on bed. worked on ant/post rocking and forward reaching with left UE onto chair surface next to bed with pt downgrading to close supervision for sitting balance. Postural control: Posterior lean             Cognition Arousal/Alertness: Awake/alert Behavior During Therapy: Flat affect Overall Cognitive Status: Difficult to assess  General Comments: pt presented as impulsive with tasks needing cues for safety/waiting for assist with all tasks performed today       Pertinent Vitals/Pain Pain Assessment: No/denies pain     PT Goals (current goals can now be found in the care plan section) Acute Rehab PT Goals Patient Stated Goal: pt unable to state PT Goal Formulation: Patient  unable to participate in goal setting Time For Goal Achievement: 01/11/20 Potential to Achieve Goals: Fair Progress towards PT goals: Progressing toward goals    Frequency    Min 4X/week      PT Plan Current plan remains appropriate    AM-PAC PT "6 Clicks" Mobility   Outcome Measure  Help needed turning from your back to your side while in a flat bed without using bedrails?: A Lot Help needed moving from lying on your back to sitting on the side of a flat bed without using bedrails?: A Lot Help needed moving to and from a bed to a chair (including a wheelchair)?: Total Help needed standing up from a chair using your arms (e.g., wheelchair or bedside chair)?: Total Help needed to walk in hospital room?: Total Help needed climbing 3-5 steps with a railing? : Total 6 Click Score: 8    End of Session Equipment Utilized During Treatment: Gait belt Activity Tolerance: Patient tolerated treatment well Patient left: in chair;with call bell/phone within reach;with nursing/sitter in room;with family/visitor present Nurse Communication: Mobility status;Need for lift equipment PT Visit Diagnosis: Other abnormalities of gait and mobility (R26.89);Hemiplegia and hemiparesis;Other symptoms and signs involving the nervous system (R29.898) Hemiplegia - Right/Left: Right Hemiplegia - dominant/non-dominant: Dominant Hemiplegia - caused by: Cerebral infarction     Time: [id number] PT Time Calculation (min) (ACUTE ONLY): 27 min  Charges:  $Therapeutic Activity: 8-22 mins $Neuromuscular Re-education: 8-22 mins                    Willow Ora, PTA, [facility] Acute Rehab Services Office- [phone] 01/01/20, 10:57 AM   Willow Ora 01/01/2020, 10:54 AM

## 2020-01-02 LAB — GLUCOSE, CAPILLARY
Glucose-Capillary: 108 mg/dL — ABNORMAL HIGH (ref 70–99)
Glucose-Capillary: 115 mg/dL — ABNORMAL HIGH (ref 70–99)
Glucose-Capillary: 141 mg/dL — ABNORMAL HIGH (ref 70–99)
Glucose-Capillary: 156 mg/dL — ABNORMAL HIGH (ref 70–99)
Glucose-Capillary: 207 mg/dL — ABNORMAL HIGH (ref 70–99)
Glucose-Capillary: 221 mg/dL — ABNORMAL HIGH (ref 70–99)
Glucose-Capillary: 240 mg/dL — ABNORMAL HIGH (ref 70–99)

## 2020-01-02 LAB — BASIC METABOLIC PANEL
Anion gap: 10 (ref 5–15)
BUN: 39 mg/dL — ABNORMAL HIGH (ref 6–20)
CO2: 29 mmol/L (ref 22–32)
Calcium: 9.3 mg/dL (ref 8.9–10.3)
Chloride: 113 mmol/L — ABNORMAL HIGH (ref 98–111)
Creatinine, Ser: 1.33 mg/dL — ABNORMAL HIGH (ref 0.44–1.00)
GFR calc Af Amer: 53 mL/min — ABNORMAL LOW (ref >60)
GFR calc non Af Amer: 46 mL/min — ABNORMAL LOW (ref >60)
Glucose, Bld: 118 mg/dL — ABNORMAL HIGH (ref 70–99)
Potassium: 3.4 mmol/L — ABNORMAL LOW (ref 3.5–5.1)
Sodium: 152 mmol/L — ABNORMAL HIGH (ref 135–145)

## 2020-01-02 LAB — CBC
HCT: 40.6 % (ref 36.0–46.0)
Hemoglobin: 12.2 g/dL (ref 12.0–15.0)
MCH: 29 pg (ref 26.0–34.0)
MCHC: 30 g/dL (ref 30.0–36.0)
MCV: 96.4 fL (ref 80.0–100.0)
Platelets: 238 10*3/uL (ref 150–400)
RBC: 4.21 MIL/uL (ref 3.87–5.11)
RDW: 14.5 % (ref 11.5–15.5)
WBC: 9.1 10*3/uL (ref 4.0–10.5)
nRBC: 0 % (ref 0.0–0.2)

## 2020-01-02 LAB — APTT
aPTT: 56 seconds — ABNORMAL HIGH (ref 24–36)
aPTT: 56 seconds — ABNORMAL HIGH (ref 24–36)

## 2020-01-02 LAB — HEPARIN LEVEL (UNFRACTIONATED)
Heparin Unfractionated: 0.41 IU/mL (ref 0.30–0.70)
Heparin Unfractionated: 0.38 IU/mL (ref 0.30–0.70)

## 2020-01-02 MED ORDER — IPRATROPIUM-ALBUTEROL 0.5-2.5 (3) MG/3ML IN SOLN: 3.0000 mL | RESPIRATORY_TRACT | Status: DC | PRN

## 2020-01-02 NOTE — Progress Notes (Signed)
STROKE TEAM PROGRESS NOTE   INTERVAL HISTORY Daughter is at bedside again today, I answered all questions yesterday, patient stable, can follow simple commands inconsistently, better with visual cues, she is alert and awake however.   Vitals:   01/02/20 0430 01/02/20 0500 01/02/20 0810 01/02/20 1140  BP: 112/82  140/85 (!) 131/91  Pulse: 86  93 99  Resp: 16  18 20   Temp: 98.1 F (36.7 C)  97.8 F (36.6 C) 97.9 F (36.6 C)  TempSrc: Oral  Oral   SpO2: 100%  99% 100%  Weight:  99.1 kg    Height:       CBC:  Recent Labs  Lab 12/26/19 2254 12/26/19 2254 12/27/19 0305 12/27/19 0314 12/31/19 0712 01/02/20 0442  WBC 8.1   < > 7.9   < > 8.9 9.1  NEUTROABS 5.1  --  6.1  --   --   --   HGB 13.5   < > 13.3   < > 13.4 12.2  HCT 42.0   < > 41.6   < > 45.0 40.6  MCV 91.7   < > 91.6   < > 98.5 96.4  PLT 221   < > 217   < > 216 238   < > = values in this interval not displayed.   Basic Metabolic Panel:  Recent Labs  Lab 12/28/19 0443 12/28/19 0843 12/28/19 1855 12/29/19 0120 01/01/20 0323 01/02/20 0442  NA 141   < > 148*   < > 158* 152*  K 4.0  --   --    < > 3.2* 3.4*  CL 107  --   --    < > 112* 113*  CO2 25  --   --    < > 29 29  GLUCOSE 160*  --   --    < > 142* 118*  BUN 18  --   --    < > 43* 39*  CREATININE 1.07*  --   --    < > 1.29* 1.33*  CALCIUM 8.5*  --   --    < > 10.2 9.3  MG 2.2  --  2.1  --   --   --   PHOS 3.3  --  2.5  --   --   --    < > = values in this interval not displayed.   Lipid Panel:     Component Value Date/Time   CHOL 121 12/27/2019 0305   TRIG 128 12/27/2019 0305   HDL 27 (L) 12/27/2019 0305   CHOLHDL 4.5 12/27/2019 0305   VLDL 26 12/27/2019 0305   LDLCALC 68 12/27/2019 0305   HgbA1c:  Lab Results  Component Value Date   HGBA1C 11.6 (H) 12/27/2019   Urine Drug Screen:     Component Value Date/Time   LABOPIA NONE DETECTED 12/26/2019 2249   COCAINSCRNUR POSITIVE (A) 12/26/2019 2249   LABBENZ NONE DETECTED 12/26/2019 2249   AMPHETMU NONE DETECTED 12/26/2019 2249   THCU NONE DETECTED 12/26/2019 2249   LABBARB NONE DETECTED 12/26/2019 2249    Alcohol Level     Component Value Date/Time   ETH <10 12/26/2019 2255    IMAGING past 24 hours No results found.  Neurologic exam: Patient is alert and awake, globally aphasic,can follow simple commands inconsistently, better with visual cues,, she has a left gaze preference, not crossing the midline, pupils are equally reactive to light, round, could not examine fundi due to noncooperation, right hemianopia, right-sided  neglect, left-sided gaze preference does not cross the midline, right lower facial weakness, hearing appears intact to voice, right hemiplegia, moving the left side spontaneously against gravity, withdraws only on the left to pain, gait not tested.   ASSESSMENT/PLAN [name] is a 54 y.o. female with history of DM, HTN, atrial fibrillation on Eliquis, anemia, CHF, COPD and polysubstance abuse presenting to [facility] with R hemiparesis, unresponsive. Found to have L MCA infarct transferred to [facility]. [facility] for thrombectomy .   Stroke:   L MCA infarct w/ petechial hemorrhage embolic in setting of cocaine use with AF on eliquis and cardiomyopathy  Code Stroke C T head evolving hypodensity L basal ganglia, L insula, L temporal lobe. ? Hyperdensity L ICA terminus. ASPECTS 6    CTA head & neck L ICA terminus occlusion w/ distal reconstitution mid and idstal L M1 w/ attenuated flow distally. Mild L ICA bifurcation atherosclerosis. Short severe proximal R V1 stenosis. 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   CT perfusion 24cc core infarct deep and subcortical white matter anterior L frontal lobe. 214cc penumbra.  Cerebral angio L ICA occlusion w/ no flow in ACA or MCA. TICI3 revascularization ACA and MCA. Earleen Newport)  Post IR CT no hemorrhage, no stain   MRI large patchy left MCA infarct with hemorrhagic petechial  transformation.  Repeat CT 5/25 L MCA infarct w/ petechial hemorrhage, now w/ 20mm midline shift   Repeat CT 5/26 unchanged hemorrhage and shift  Repeat CT 5/28 - unchanged L MCA infarct w/ petechial hemorrhage, unchanged 51mm midline shift   2D Echo decreased ejection fraction 30 to 35%.  No definite clot.  LDL 68  HgbA1c 11.6  Heparin 5000 units sq tid for VTE prophylaxis  Eliquis (apixaban) daily prior to admission, now on aspirin 81 mg daily. On  IV heparin, stop aspirin. Will likely d/c on Eliquis pending compliance and ability to swallow  Therapy recommendations:  CIR planned  Disposition:  pending (lived with boyfriend PTA)  Acute Respiratory Failure  Intubated for IR, left intubated for airway protection, now extubated  CXR streaky opacities favoring atx yest, today w/ post op atx  Extubated 5/27   CCM signed off   Cytotoxic Cerebral Edema  45mm midline shift seen on CT 5/25 am  Started 3% hypertonic saline protocol -> off  Na [id number]-161->158->152   Started on free water 200 q 6h  Allow Na to drift down  Do not resume 3%  Check Na daily  Repeat CT 5/26 unchanged hemorrhage and shift  Repeat CT 5/28 unchanged L MCA infarct w/ petechial hemorrhage, unchanged 81mm midline shift    Atrial Fibrillation w/ RVR  Home anticoagulation:  Eliquis (apixaban) daily - last documented fill time was end of March 2021  On Coreg 50 bid, digoxin 0.125 daily PTA - resumed in hospital  Now on Heparin IV - Eliquis planned when pt can swallow meds.  (Swallowing study - pending)  Cardiomyopathy  2D Echo decreased ejection fraction 30 to 35%.  No definite clot.  Demadex ; Coreg ; Bidil ; digoxin  Hypertension  Home meds:  Hydralazine 50 tid . BP goal < 180 . On coreg 50 bid, torsemide 20, isosorbide-hydralazine 20-37.5 tid, prn labetalol . Long-term BP goal normotensive  Diabetes type II Uncontrolled  Home meds:  trulicity [id number]  weekly, 70/30 30u bid, metformin 1000  HgbA1c 11.6, goal < 7.0  CBGs Recent Labs    01/02/20 0357 01/02/20 0803 01/02/20 1137  GLUCAP 108* 141* 240*      SSI (0-20)  Holding metformin  On launtus 60  On 2u TF coverage q4h  Dysphagia . Secondary to stroke . NPO . On TF via Cortrak . Speech on board . Swallowing study - pending  Other Stroke Risk Factors  Former Cigarette smoker  ETOH use, alcohol level <10, will advise to drink no more than 1 drink(s) a day. Family aware  Polysubstance abuse, hx cocaine and marijuana- UDS:  THC NEGATIVE, Cocaine POSITIVE. Will advise to stop using due to stroke risk. Family aware.  Obesity, Body mass index is 35.26 kg/m., recommend weight loss, diet and exercise as appropriate   Congestive heart failure, on torsemide 20 bid ->decrease to daily given slight increase in Cre   Other Active Problems  Fatty liver  COPD  GERD  AKI - 1.07->1.29 (on Demadex daily) - 1.33 - recheck Tuesday - may need to decrease Demadex if creatinine continues to climb.  5x6x7 saccular parlinoid R ICA aneurysm  Hypokalemia - 3.2 -> increase daily KCL - 3.4 improving - recheck Tuesday  Hospital day # 6  Personally examined patient and images, and have participated in and made any corrections needed to history, physical, neuro exam,assessment and plan as stated above.  I have personally obtained the history, evaluated lab date, reviewed imaging studies and agree with radiology interpretations.    Sarina Ill, MD Stroke Neurology   A total of 25 minutes was spent for the care of this patient, spent on counseling patient and family on different diagnostic and therapeutic options, counseling and coordination of care, riskd ans benefits of management, compliance, or risk factor reduction and education.   A total of 25 minutes was spent for the care of this patient, spent on counseling patient and family on different diagnostic and therapeutic  options, counseling and coordination of care, riskd ans benefits of management, compliance, or risk factor reduction and education.    To contact Stroke Continuity provider, please refer to [email or web address] After hours, contact General Neurology

## 2020-01-02 NOTE — Progress Notes (Addendum)
ANTICOAGULATION CONSULT NOTE - Follow Up Consult  Pharmacy Consult for heparin Indication: atrial fibrillation in the setting of left MCA infarct   Labs: Recent Labs    12/31/19 0712 12/31/19 1637 12/31/19 2342 01/01/20 0323 01/01/20 0810 01/01/20 1648 01/02/20 0442  HGB 13.4  --   --   --   --   --  12.2  HCT 45.0  --   --   --   --   --  40.6  PLT 216  --   --   --   --   --  238  APTT  --   --   --   --  56* 52* 56*  HEPARINUNFRC  --    < >   < >  --  0.55 0.41 0.41  CREATININE 1.07*  --   --  1.29*  --   --  1.33*   < > = values in this interval not displayed.    Assessment: 54yo female subtherapeutic on heparin based on aPTT (56) with  heparin level (0.41) after increase in rate; no gtt issues or signs of bleeding per RN.  Based on discordance between aPTT and heparin level. Will adjust based on aPTT due to possible interference with apixaban dosing PTA with heparin level.    Goal of Therapy:  Heparin level 0.3-0.5 units/ml  APTT 66-85   Plan:  Increase heparin to 1150 units/hr Aptt and heparin level in 6 hours  Rishith Siddoway A. Levada Dy, PharmD, BCPS, FNKF Clinical Pharmacist [place] Please utilize Amion for appropriate phone number to reach the unit pharmacist ([place])  Addendum: Heparin level within range at 0.38, no bleeding noted. Will increase rate slightly as heparin level decreased with increased rate. F/u heparin level in AM.   Plan:   Increase heparin to 1200 units/hr Heparin level in AM  Keeana Pieratt A. Levada Dy, PharmD, BCPS, FNKF Clinical Pharmacist [place] Please utilize Amion for appropriate phone number to reach the unit pharmacist ([place])     01/02/2020 7:10 AM

## 2020-01-03 LAB — HEPARIN LEVEL (UNFRACTIONATED): Heparin Unfractionated: 0.57 IU/mL (ref 0.30–0.70)

## 2020-01-03 LAB — CBC
HCT: 40.3 % (ref 36.0–46.0)
Hemoglobin: 12.4 g/dL (ref 12.0–15.0)
MCH: 29.2 pg (ref 26.0–34.0)
MCHC: 30.8 g/dL (ref 30.0–36.0)
MCV: 95 fL (ref 80.0–100.0)
Platelets: 231 10*3/uL (ref 150–400)
RBC: 4.24 MIL/uL (ref 3.87–5.11)
RDW: 14 % (ref 11.5–15.5)
WBC: 10.1 10*3/uL (ref 4.0–10.5)
nRBC: 0 % (ref 0.0–0.2)

## 2020-01-03 LAB — GLUCOSE, CAPILLARY
Glucose-Capillary: 126 mg/dL — ABNORMAL HIGH (ref 70–99)
Glucose-Capillary: 132 mg/dL — ABNORMAL HIGH (ref 70–99)
Glucose-Capillary: 176 mg/dL — ABNORMAL HIGH (ref 70–99)
Glucose-Capillary: 177 mg/dL — ABNORMAL HIGH (ref 70–99)
Glucose-Capillary: 185 mg/dL — ABNORMAL HIGH (ref 70–99)
Glucose-Capillary: 296 mg/dL — ABNORMAL HIGH (ref 70–99)

## 2020-01-03 MED ORDER — APIXABAN 5 MG PO TABS: 5.0000 mg | ORAL_TABLET | Freq: Two times a day (BID) | ORAL | Status: DC

## 2020-01-03 MED ORDER — APIXABAN 5 MG PO TABS
5.0000 mg | ORAL_TABLET | Freq: Two times a day (BID) | ORAL | Status: DC
  Administered 2020-01-03 – 2020-01-05 (×5): 5 mg
  Filled 2020-01-03 (×5): qty 1

## 2020-01-03 NOTE — Progress Notes (Signed)
I met with pt and her sister [name] at the bedside. Pt appears much more alert this AM than previous bedside assessment on Friday. [name] sister confirmed that her whole family has agreed upon CIR and will assist with 24/7 A at DC. Reviewed insurance benefits with pt's sister. All questions answered. I do have insurance approval for admit to CIR, however, approval date for CIR starts on 6/2. Will continue to follow pt progress with therapies to ensure she can tolerate 3 hours a day of therapy.   TOC aware of approval and insurance start date of 6/2.   Please call if questions.   Raechel Ache, OTR/L  Rehab Admissions Coordinator  [phone] 01/03/2020 11:01 AM

## 2020-01-03 NOTE — Progress Notes (Signed)
  Speech Language Pathology Treatment: Dysphagia;Cognitive-Linquistic  Patient Details Name: Robin [name] MRN: [id number] DOB: [birth year] Today's Date: 01/03/2020 Time: [id number] SLP Time Calculation (min) (ACUTE ONLY): 23 min  Assessment / Plan / Recommendation Clinical Impression  Pt was seen for skilled ST targeting dysphagia and aphasia.  [name] sister was present for the entirety of this tx session.  Sister reported that the pt tolerated her breakfast well this morning, but she stated that the pt has been lethargic this morning.  Pt required frequent moderate verbal and tactile stimulation to maintain alertness throughout this session.  Pt was noted to have a baseline cough and anterior labial spillage of saliva on the R side upon SLP arrival.  She was seen with trials of pudding thick liquid, honey-thick liquid, and puree.  Pt was able to independently drink from the cup, but she required cues to limit bolus size and to slow rate of intake.  Suspect delayed swallow initiation with all trials and a delayed cough was observed following 2/2 HTL trials, 1/4 pudding thick liquid trials, and 1/3 puree trials.  Unable to determine if cough was directly related to PO intake secondary to baseline cough upon arrival.   Pt completed multiple tasks targeting expressive and receptive language deficits including following 1-step commands, single word repetition, confrontational naming, and yes/no questions.  She answered yes/no questions with 40% accuracy; however, she answered "yes" for almost all questions; therefore, accuracy is more likely related to statistical odds rather than accurate language comprehension.  She was unable to point to a basic "yes"/"no" AAC board to enhance communication.  Pt was additionally unable to follow 1-step commands despite max verbal and visual cues, and she was not able to name basic items in the room despite max semantic and phonemic cues.  She did not repeat single words in  this session.  She was able to state her name given encouragement from sister. Session was eventually discontinued secondary to pt's lethargy.  Recommend continuation of puree/pudding thick liquids with medications administered crushed in puree.  SLP will continue to f/u for treatment targeting dysphagia and aphasia.    HPI HPI: Pt is a 54 year old female admitted with acute L MCA CVA and L ICA occlusion s/p thrombectomy. ETT 5/24-5/27. PMH includes: DM, Afib, CHF, polysubstance abuse, COPD, GERD, HTN      SLP Plan  Continue with current plan of care       Recommendations  Diet recommendations: Dysphagia 1 (puree);Pudding-thick liquid Liquids provided via: Cup;Teaspoon Medication Administration: Crushed with puree Supervision: Full supervision/cueing for compensatory strategies;Staff to assist with self feeding Compensations: Minimize environmental distractions;Slow rate;Small sips/bites;Monitor for anterior loss Postural Changes and/or Swallow Maneuvers: Seated upright 90 degrees                Oral Care Recommendations: Oral care QID;Staff/trained caregiver to provide oral care Follow up Recommendations: Inpatient Rehab SLP Visit Diagnosis: Dysphagia, oropharyngeal phase (R13.12) Plan: Continue with current plan of care       [place]., M.S., [place] Office: [phone]  [place] 01/03/2020, 11:16 AM

## 2020-01-03 NOTE — Progress Notes (Addendum)
OT treatment:   Pt seen for OT/PT session.  Continues to require max assist +2 for bed mobility, utilized stedy with max assist +2 over 2 trials sit to stand.  Patient limited by lethargy, cognition, R hemi, and impaired balance.  Pt with initial posterior lean in sitting, but anterior "hunched over" lean as she quickly fatigued in sitting and standing. Requires multimodal cueing for all tasks, with slow processing and decreased initiation noted. Cueing to open eyes during session, but limited ability to sustain. Family present and supportive throughout session. Will follow acutely. CIR remains appropriate.     01/03/20 1300  OT Visit Information  Last OT Received On 01/03/20  Assistance Needed +2  PT/OT/SLP Co-Evaluation/Treatment Yes  Reason for Co-Treatment For patient/therapist safety;To address functional/ADL transfers  OT goals addressed during session ADL's and self-care;Strengthening/ROM  History of Present Illness 54 y.o. female with a history of DM, HTN, atrial fibrillation on Eliquis, anemia, CHF, COPD and polysubstance abuse, presenting initially to [facility] ED after being found unresponsive at home after family heard her fall. Pt transferred/admitted to [facility] for acute left MCA stroke and L ICA occlusion, is now s/p mechanical thrombectomy on 5/24, pt kept intubated post procedure.   Precautions  Precautions Fall  Precaution Comments R hemi  Pain Assessment  Pain Assessment Faces  Faces Pain Scale 2  Pain Location generalized discomfort with sitting upright  Pain Descriptors / Indicators Grimacing;Discomfort  Pain Intervention(s) Monitored during session;Repositioned  Cognition  Arousal/Alertness Lethargic  Behavior During Therapy Flat affect;Impulsive  Overall Cognitive Status Difficult to assess  General Comments pt presented as impulsive with tasks needing cues for safety/waiting for assist with all tasks performed today; closing eyes and lethargic, following minimal commands    Difficult to assess due to Impaired communication;Level of arousal  Upper Extremity Assessment  Upper Extremity Assessment RUE deficits/detail  RUE Deficits / Details flaccid RUE, observed sponteous movement after wrist flexion/extension to hand   RUE Sensation decreased proprioception  RUE Coordination decreased fine motor;decreased gross motor  ADL  Overall ADL's  Needs assistance/impaired  Toilet Transfer Maximal assistance;+2 for physical assistance;+2 for safety/equipment;Cueing for safety  Toilet Transfer Details (indicate cue type and reason) +2 max assist for sit<>stand with stedy; sit to stand only unsafe to transition due to anterior and R lean   Functional mobility during ADLs Maximal assistance;+2 for physical assistance;+2 for safety/equipment;Cueing for safety;Cueing for sequencing  General ADL Comments pt requires total assist for all ADLs at this time   Bed Mobility  Overal bed mobility Needs Assistance  Bed Mobility Supine to Sit  Supine to sit Max assist;HOB elevated;+2 for safety/equipment;+2 for physical assistance  Sit to supine Mod assist;+2 for physical assistance;+2 for safety/equipment  General bed mobility comments Pt required Max-Mod A +2 for safety, physical support and mulitmodal cueing for technique; using bed pad to scoot hips   Balance  Overall balance assessment Needs assistance  Sitting-balance support Feet supported;Single extremity supported  Sitting balance-Leahy Scale Fair  Sitting balance - Comments min A for support sitting EOB with L UE supporting; fluctating between posterior lean initally, but then anterior lean (hunched over as fatigued) with multimodal cueing and physical assist to correct  Postural control Posterior lean  Standing balance support Bilateral upper extremity supported;During functional activity  Standing balance-Leahy Scale Zero  Standing balance comment relies on external support in stedy   Restrictions  Weight Bearing  Restrictions No  Transfers  Overall transfer level Needs assistance  Equipment used  (gait  belt and stedy)  Transfer via Psychologist, prison and probation services to/from Stand;Lateral/Scoot Transfers  Sit to Stand Max assist;Total assist;+2 physical assistance;+2 safety/equipment   Lateral/Scoot Transfers Max assist;+2 physical assistance;+2 safety/equipment  General transfer comment stood x2 reps EOB<>stand with stedy device and +2 Max A, anterior lean of trunk in standing with max physical/multimodal cueing to correct. limited tolerance; scooting to [facility] with max assist +2 towards L side  General Comments  General comments (skin integrity, edema, etc.) Sister present for treatment; gentle PROM of R UE from shoulder to hand   OT - End of Session  Equipment Utilized During Treatment Gait belt;Other (comment) (stedy)  Activity Tolerance Patient limited by fatigue  Patient left in bed;with call bell/phone within reach;with nursing/sitter in room;with bed alarm set;with family/visitor present  Nurse Communication Mobility status;Precautions  OT Assessment/Plan  OT Plan Discharge plan remains appropriate;Frequency remains appropriate  OT Visit Diagnosis Other abnormalities of gait and mobility (R26.89);Muscle weakness (generalized) (M62.81);Hemiplegia and hemiparesis  Hemiplegia - Right/Left Right  Hemiplegia - dominant/non-dominant Dominant  Hemiplegia - caused by Cerebral infarction  OT Frequency (ACUTE ONLY) Min 2X/week  Recommendations for Other Services Rehab consult  Follow Up Recommendations CIR;Supervision/Assistance - 24 hour  OT Equipment Other (comment) (TBD)  AM-PAC OT "6 Clicks" Daily Activity Outcome Measure (Version 2)  Help from another [place] eating meals? 1  Help from another [place] taking care of personal grooming? 1  Help from another [place] toileting, which includes using toliet, bedpan, or urinal? 1  Help from another [place] bathing (including washing, rinsing, drying)? 1   Help from another [place] to put on and taking off regular upper body clothing? 1  Help from another [place] to put on and taking off regular lower body clothing? 1  6 Click Score 6  OT Goal Progression  Progress towards OT goals Progressing toward goals  Acute Rehab OT Goals  Patient Stated Goal pt unable to state  OT Goal Formulation Patient unable to participate in goal setting  Time For Goal Achievement 01/11/20  Potential to Achieve Goals Fair  OT Time Calculation  OT Start Time (ACUTE ONLY) 1159  OT Stop Time (ACUTE ONLY) 1222  OT Time Calculation (min) 23 min  OT General Charges  $OT Visit 1 Visit  OT Treatments  $Therapeutic Activity 8-22 mins    Jolaine Artist, OT Acute Rehabilitation Services Pager [phone] Office [phone]

## 2020-01-03 NOTE — Progress Notes (Signed)
Physical Therapy Treatment Patient Details Name: Robin [name] MRN: [id number] DOB: 08/03/1966 Today's Date: 01/03/2020    History of Present Illness 54 y.o. female with a history of DM, HTN, atrial fibrillation on Eliquis, anemia, CHF, COPD and polysubstance abuse, presenting initially to [facility] ED after being found unresponsive at home after family heard her fall. Pt transferred/admitted to [facility] for acute left MCA stroke and L ICA occlusion, is now s/p mechanical thrombectomy on 5/24, pt kept intubated post procedure.     PT Comments    Patient seen for mobility progression. Pt able to stand X 2 trials utilizing Stedy standing frame. Pt continues to present with R side hemiplegia and inattention. Family present and supportive.  Continue to recommend CIR for further skilled PT services to maximize independence and safety with mobility.     Follow Up Recommendations  CIR;Supervision/Assistance - 24 hour     Equipment Recommendations  Hospital bed;Wheelchair (measurements PT);Wheelchair cushion (measurements PT);3in1 (PT)    Recommendations for Other Services Rehab consult     Precautions / Restrictions Precautions Precautions: Fall Precaution Comments: R hemi Restrictions Weight Bearing Restrictions: No    Mobility  Bed Mobility Overal bed mobility: Needs Assistance Bed Mobility: Supine to Sit;Sit to Supine Rolling: Mod assist;+2 for physical assistance Sidelying to sit: Max assist;+2 for physical assistance Supine to sit: Max assist;HOB elevated;+2 for safety/equipment Sit to supine: Mod assist;+2 for physical assistance   General bed mobility comments: multimodal cues for sequencing; assist to bring R LE and hips to EOB with bed pad and then to elevate trunk into sitting   Transfers Overall transfer level: Needs assistance Equipment used: (gait belt and stedy) Transfers: Sit to/from Stand;Lateral/Scoot Transfers Sit to Stand: Max assist;+2 physical assistance    Squat pivot transfers: Max assist;+2 physical assistance    Lateral/Scoot Transfers: Mod assist;Max assist;+2 physical assistance General transfer comment: pt stood X 2 trials; second trial pt able to achieve upright standing with facilitation of hip extension; cues for cervical extension and forward gaze   Ambulation/Gait                 Stairs             Wheelchair Mobility    Modified Rankin (Stroke Patients Only)       Balance Overall balance assessment: Needs assistance Sitting-balance support: Feet supported;Single extremity supported Sitting balance-Leahy Scale: Poor Sitting balance - Comments: min A for support sitting EOB with L UE supporting Postural control: Posterior lean                                  Cognition Arousal/Alertness: Lethargic Behavior During Therapy: Flat affect;Impulsive Overall Cognitive Status: Difficult to assess                                 General Comments: pt closing eyes often throughout session and with varying levels of participation      Exercises      General Comments General comments (skin integrity, edema, etc.): Sister present for treatment      Pertinent Vitals/Pain Pain Assessment: Faces Faces Pain Scale: Hurts a little bit Pain Location: generalized discomfort with sitting upright Pain Descriptors / Indicators: Grimacing;Discomfort Pain Intervention(s): Monitored during session;Repositioned    Home Living  Prior Function            PT Goals (current goals can now be found in the care plan section) Progress towards PT goals: Progressing toward goals    Frequency    Min 4X/week      PT Plan Current plan remains appropriate    Co-evaluation PT/OT/SLP Co-Evaluation/Treatment: Yes Reason for Co-Treatment: For patient/therapist safety;To address functional/ADL transfers PT goals addressed during session: Mobility/safety with  mobility;Balance OT goals addressed during session: ADL's and self-care;Strengthening/ROM      AM-PAC PT "6 Clicks" Mobility   Outcome Measure  Help needed turning from your back to your side while in a flat bed without using bedrails?: A Lot Help needed moving from lying on your back to sitting on the side of a flat bed without using bedrails?: A Lot Help needed moving to and from a bed to a chair (including a wheelchair)?: Total Help needed standing up from a chair using your arms (e.g., wheelchair or bedside chair)?: Total Help needed to walk in hospital room?: Total Help needed climbing 3-5 steps with a railing? : Total 6 Click Score: 8    End of Session Equipment Utilized During Treatment: Gait belt Activity Tolerance: Patient tolerated treatment well Patient left: in bed;with call bell/phone within reach;with bed alarm set;with family/visitor present Nurse Communication: Mobility status;Need for lift equipment PT Visit Diagnosis: Other abnormalities of gait and mobility (R26.89);Hemiplegia and hemiparesis;Other symptoms and signs involving the nervous system (R29.898) Hemiplegia - Right/Left: Right Hemiplegia - dominant/non-dominant: Dominant Hemiplegia - caused by: Cerebral infarction     Time: [id number] PT Time Calculation (min) (ACUTE ONLY): 23 min  Charges:  $Gait Training: 8-22 mins                     Earney Navy, PTA Acute Rehabilitation Services Pager: [phone] Office: [phone]     Darliss Cheney 01/03/2020, 1:54 PM

## 2020-01-03 NOTE — Progress Notes (Addendum)
ANTICOAGULATION CONSULT NOTE - Follow Up Consult  Pharmacy Consult for heparin Indication: atrial fibrillation in the setting of left MCA infarct   Labs: Recent Labs    01/01/20 0323 01/01/20 0810 01/01/20 1648 01/01/20 1648 01/02/20 0442 01/02/20 1331 01/03/20 0707  HGB  --   --   --   --  12.2  --  12.4  HCT  --   --   --   --  40.6  --  40.3  PLT  --   --   --   --  238  --  231  APTT  --    < > 52*  --  56* 56*  --   HEPARINUNFRC  --    < > 0.41   < > 0.41 0.38 0.57  CREATININE 1.29*  --   --   --  1.33*  --   --    < > = values in this interval not displayed.    Assessment: 54yo female with atrial fibrillation in the setting of left MCA infarct.   Heparin level above goal this AM. Heparin running at 1200 units/hr. No bleeding or problems with infusion per RN. Will decrease rate.   Goal of Therapy:  Heparin level 0.3-0.5 units/ml  APTT 66-85   Plan:  Decrease heparin to 1150 units/hr Heparin level in 6 hours  Mackinley Kiehn A. Levada Dy, PharmD, BCPS, FNKF Clinical Pharmacist [place] Please utilize Amion for appropriate phone number to reach the unit pharmacist ([place])   Addendum: Pharmacy now consulted to change heparin back to PTA apixaban. Will give dose now and restart 5mg  BID.   Plan:  D/C heparin drip D/C heparin level at 1400 Start Apixaban 5mg  BID Monitor for bleeding.   Ricco Dershem A. Levada Dy, PharmD, BCPS, FNKF Clinical Pharmacist [place] Please utilize Amion for appropriate phone number to reach the unit pharmacist ([place])        01/03/2020 7:51 AM

## 2020-01-03 NOTE — TOC Benefit Eligibility Note (Signed)
Transition of Care [facility]) Benefit Eligibility Note    Patient Details  Name: Robin [name] MRN: [id number] Date of Birth: [birth year]   Medication/Dose: Arne [place]   5 MG BID  Covered?: Yes  Tier: 3 Drug  Prescription Coverage Preferred Pharmacy: [facility] [place] with [facility]/Company/Phone Number:: [id number]  @ [employer]  [id number] # [phone] OPT- 2  Co-Pay: $20.00  Prior Approval: No  Deductible: Donnie [place] Phone Number: 01/03/2020, 10:58 AM

## 2020-01-03 NOTE — Progress Notes (Signed)
STROKE TEAM PROGRESS NOTE   INTERVAL HISTORY Stable overnight, no changes, today she was up with PT and just ate and is tired and sleep but arousable. Daughter at bedside again, I discussed with daughter over the weekend and answered all questions.  Vitals:   01/02/20 2305 01/03/20 0328 01/03/20 0741 01/03/20 1147  BP: 103/61 118/77 (!) 151/86 97/66  Pulse: 94 94 84 78  Resp: 18 18 20 16   Temp: 98.3 F (36.8 C) 98.4 F (36.9 C) (!) 97.4 F (36.3 C) 97.6 F (36.4 C)  TempSrc: Oral Oral  Axillary  SpO2: 99% 100% 98% 96%  Weight:      Height:       CBC:  Recent Labs  Lab 01/02/20 0442 01/03/20 0707  WBC 9.1 10.1  HGB 12.2 12.4  HCT 40.6 40.3  MCV 96.4 95.0  PLT 238 [id number]   Basic Metabolic Panel:  Recent Labs  Lab 12/28/19 0443 12/28/19 0843 12/28/19 1855 12/29/19 0120 01/01/20 0323 01/02/20 0442  NA 141   < > 148*   < > 158* 152*  K 4.0  --   --    < > 3.2* 3.4*  CL 107  --   --    < > 112* 113*  CO2 25  --   --    < > 29 29  GLUCOSE 160*  --   --    < > 142* 118*  BUN 18  --   --    < > 43* 39*  CREATININE 1.07*  --   --    < > 1.29* 1.33*  CALCIUM 8.5*  --   --    < > 10.2 9.3  MG 2.2  --  2.1  --   --   --   PHOS 3.3  --  2.5  --   --   --    < > = values in this interval not displayed.   Lipid Panel:     Component Value Date/Time   CHOL 121 12/27/2019 0305   TRIG 128 12/27/2019 0305   HDL 27 (L) 12/27/2019 0305   CHOLHDL 4.5 12/27/2019 0305   VLDL 26 12/27/2019 0305   LDLCALC 68 12/27/2019 0305   HgbA1c:  Lab Results  Component Value Date   HGBA1C 11.6 (H) 12/27/2019   Urine Drug Screen:     Component Value Date/Time   LABOPIA NONE DETECTED 12/26/2019 2249   COCAINSCRNUR POSITIVE (A) 12/26/2019 2249   LABBENZ NONE DETECTED 12/26/2019 2249   AMPHETMU NONE DETECTED 12/26/2019 2249   THCU NONE DETECTED 12/26/2019 2249   LABBARB NONE DETECTED 12/26/2019 2249    Alcohol Level     Component Value Date/Time   ETH <10 12/26/2019 2255     IMAGING past 24 hours No results found.  PHYSICAL EXAM  Neurologic exam:  tired and sleep but arousable. globally aphasic,can follow simple commands inconsistently, better with visual cues,, she has a left gaze preference, not crossing the midline, pupils are equally reactive to light, round, could not examine fundi due to noncooperation, right hemianopia, right-sided neglect, left-sided gaze preference does not cross the midline, right lower facial weakness, hearing appears intact to voice, right hemiplegia, moving the left side spontaneously against gravity, withdraws only on the left leg to pain, gait not tested.   ASSESSMENT/PLAN [name] is a 54 y.o. female with history of DM, HTN, atrial fibrillation on Eliquis, anemia, CHF, COPD and polysubstance abuse presenting to [facility] with  R hemiparesis, unresponsive. Found to have L MCA infarct transferred to [facility]. [facility] for thrombectomy .   Stroke:   L MCA infarct w/ petechial hemorrhage embolic in setting of cocaine use with AF on eliquis and cardiomyopathy  Code Stroke C T head evolving hypodensity L basal ganglia, L insula, L temporal lobe. ? Hyperdensity L ICA terminus. ASPECTS 6    CTA head & neck L ICA terminus occlusion w/ distal reconstitution mid and idstal L M1 w/ attenuated flow distally. Mild L ICA bifurcation atherosclerosis. Short severe proximal R V1 stenosis. 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   CT perfusion 24cc core infarct deep and subcortical white matter anterior L frontal lobe. 214cc penumbra.  Cerebral angio L ICA occlusion w/ no flow in ACA or MCA. TICI3 revascularization ACA and MCA. Robin [name])  Post IR CT no hemorrhage, no stain   MRI large patchy left MCA infarct with hemorrhagic petechial transformation.  Repeat CT 5/25 L MCA infarct w/ petechial hemorrhage, now w/ 77mm midline shift   Repeat CT 5/26 unchanged hemorrhage and shift  Repeat CT 5/28 - unchanged L  MCA infarct w/ petechial hemorrhage, unchanged 16mm midline shift   2D Echo decreased ejection fraction 30 to 35%.  No definite clot.  LDL 68  HgbA1c 11.6  Heparin 5000 units sq tid for VTE prophylaxis  Eliquis (apixaban) daily prior to admission, now on aspirin 81 mg daily. On  IV heparin, stop aspirin. Will likely d/c on Eliquis pending compliance and ability to swallow  Therapy recommendations:  CIR planned  Disposition:  pending (lived with boyfriend PTA)  Acute Respiratory Failure  Intubated for IR, left intubated for airway protection, now extubated  CXR streaky opacities favoring atx yest, today w/ post op atx  Extubated 5/27   CCM signed off   Cytotoxic Cerebral Edema  10mm midline shift seen on CT 5/25 am  Started 3% hypertonic saline protocol -> off  Na [id number]->158->152   Started on free water 200 q 6h  Allow Na to drift down  Do not resume 3%  Check Na daily  Repeat CT 5/26 unchanged hemorrhage and shift  Repeat CT 5/28 unchanged L MCA infarct w/ petechial hemorrhage, unchanged 94mm midline shift    Atrial Fibrillation w/ RVR  Home anticoagulation:  Eliquis (apixaban) daily - last documented fill time was end of March 2021  On Coreg 50 bid, digoxin 0.125 daily PTA - resumed in hospital Now on Heparin IV - can restart Eliquis Cardiomyopathy  2D Echo decreased ejection fraction 30 to 35%.  No definite clot.  Demadex ; Coreg ; Bidil ; digoxin  Hypertension  Home meds:  Hydralazine 50 tid . BP goal < 180 . On coreg 50 bid, torsemide 20, isosorbide-hydralazine 20-37.5 tid, prn labetalol . Long-term BP goal normotensive  Diabetes type II Uncontrolled  Home meds:  trulicity [id number] weekly, 0000000 30u bid, metformin 1000  HgbA1c 11.6, goal < 7.0  CBGs Recent Labs    01/03/20 0325 01/03/20 0746 01/03/20 1145  GLUCAP 132* 126* 176*      SSI (0-20)  Holding metformin  On launtus 60  On 2u TF coverage  q4h  Dysphagia . Secondary to stroke . NPO . On TF via Cortrak . Speech on board . Swallowing study - completed, can swallow, on diet  Other Stroke Risk Factors  Former Cigarette smoker  ETOH use, alcohol level <10, will advise to drink no more than 1 drink(s) a day. Family aware  Polysubstance abuse,  hx cocaine and marijuana- UDS:  THC NEGATIVE, Cocaine POSITIVE. Will advise to stop using due to stroke risk. Family aware.  Obesity, Body mass index is 35.26 kg/m., recommend weight loss, diet and exercise as appropriate   Congestive heart failure, on torsemide 20 bid ->decrease to daily given slight increase in Cre   Other Active Problems  Fatty liver  COPD  GERD  AKI - 1.07->1.29 (on Demadex daily) - 1.33 - recheck Tuesday - may need to decrease Demadex if creatinine continues to climb. 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  I have personally obtained the history, evaluated lab date, reviewed imaging studies and agree with radiology interpretations.   Sarina Ill, MD Stroke Neurology  A total of 15 minutes was spent for the care of this patient, spent on counseling patient and family on different diagnostic and therapeutic options, counseling and coordination of care, riskd ans benefits of management, compliance, or risk factor reduction and education.    To contact Stroke Continuity provider, please refer to [email or web address] After hours, contact General Neurology

## 2020-01-03 NOTE — Progress Notes (Signed)
CSW acknowledging consult for UDS positive for cocaine, however patient is not appropriate for substance abuse assessment or counseling at this time as she is only oriented to self at this time. Please reconsult CSW should patient become appropriate for assessment.  Laveda Abbe, [place] Clinical Social Worker [phone]

## 2020-01-04 DIAGNOSIS — I639 Cerebral infarction, unspecified: Secondary | ICD-10-CM

## 2020-01-04 DIAGNOSIS — I480 Paroxysmal atrial fibrillation: Secondary | ICD-10-CM

## 2020-01-04 DIAGNOSIS — E876 Hypokalemia: Secondary | ICD-10-CM

## 2020-01-04 LAB — CBC
HCT: 41.6 % (ref 36.0–46.0)
Hemoglobin: 12.9 g/dL (ref 12.0–15.0)
MCH: 29.4 pg (ref 26.0–34.0)
MCHC: 31 g/dL (ref 30.0–36.0)
MCV: 94.8 fL (ref 80.0–100.0)
Platelets: 223 10*3/uL (ref 150–400)
RBC: 4.39 MIL/uL (ref 3.87–5.11)
RDW: 13.7 % (ref 11.5–15.5)
WBC: 9.5 10*3/uL (ref 4.0–10.5)
nRBC: 0 % (ref 0.0–0.2)

## 2020-01-04 LAB — COMPREHENSIVE METABOLIC PANEL
ALT: 33 U/L (ref 0–44)
AST: 46 U/L — ABNORMAL HIGH (ref 15–41)
Albumin: 2.9 g/dL — ABNORMAL LOW (ref 3.5–5.0)
Alkaline Phosphatase: 65 U/L (ref 38–126)
Anion gap: 9 (ref 5–15)
BUN: 24 mg/dL — ABNORMAL HIGH (ref 6–20)
CO2: 23 mmol/L (ref 22–32)
Calcium: 8.8 mg/dL — ABNORMAL LOW (ref 8.9–10.3)
Chloride: 111 mmol/L (ref 98–111)
Creatinine, Ser: 1.11 mg/dL — ABNORMAL HIGH (ref 0.44–1.00)
GFR calc Af Amer: >60 mL/min (ref >60)
GFR calc non Af Amer: 57 mL/min — ABNORMAL LOW (ref >60)
Glucose, Bld: 139 mg/dL — ABNORMAL HIGH (ref 70–99)
Potassium: 4.1 mmol/L (ref 3.5–5.1)
Sodium: 143 mmol/L (ref 135–145)
Total Bilirubin: 0.8 mg/dL (ref 0.3–1.2)
Total Protein: 6.4 g/dL — ABNORMAL LOW (ref 6.5–8.1)

## 2020-01-04 LAB — GLUCOSE, CAPILLARY
Glucose-Capillary: 110 mg/dL — ABNORMAL HIGH (ref 70–99)
Glucose-Capillary: 174 mg/dL — ABNORMAL HIGH (ref 70–99)
Glucose-Capillary: 223 mg/dL — ABNORMAL HIGH (ref 70–99)
Glucose-Capillary: 227 mg/dL — ABNORMAL HIGH (ref 70–99)
Glucose-Capillary: 232 mg/dL — ABNORMAL HIGH (ref 70–99)
Glucose-Capillary: 273 mg/dL — ABNORMAL HIGH (ref 70–99)

## 2020-01-04 LAB — BASIC METABOLIC PANEL
Anion gap: 11 (ref 5–15)
BUN: 28 mg/dL — ABNORMAL HIGH (ref 6–20)
CO2: 23 mmol/L (ref 22–32)
Calcium: 9.2 mg/dL (ref 8.9–10.3)
Chloride: 108 mmol/L (ref 98–111)
Creatinine, Ser: 1.2 mg/dL — ABNORMAL HIGH (ref 0.44–1.00)
GFR calc Af Amer: 60 mL/min — ABNORMAL LOW (ref >60)
GFR calc non Af Amer: 52 mL/min — ABNORMAL LOW (ref >60)
Glucose, Bld: 220 mg/dL — ABNORMAL HIGH (ref 70–99)
Potassium: 4.9 mmol/L (ref 3.5–5.1)
Sodium: 142 mmol/L (ref 135–145)

## 2020-01-04 NOTE — Discharge Summary (Addendum)
Stroke Discharge Summary  Patient ID: Robin [name]    l   MRN: [id number]      DOB: May 13, 1966  Date of Admission: 12/26/2019 Date of Discharge: 01/05/2020  Attending Physician:  Rosalin Hawking, MD, Stroke MD Consultant(s):   Meryl Crutch, MD (teleneurology), Silver Huguenin, MD (PCCM), [name], MD (Physical Medicine & Rehabilitation) Patient's PCP:  Inda Coke, [place]  Discharge Diagnoses:  Principal Problem:   Embolic cerebral infarction [facility]) s/p clot retrieval Active Problems:   Respiratory failure with hypoxia ([place])   GERD (gastroesophageal reflux disease)   COPD (chronic obstructive pulmonary disease) ([place])   Nicotine abuse   Obesity   Essential hypertension, benign   Hypokalemia   Cocaine abuse ([place])   Type 2 diabetes mellitus with diabetic autonomic neuropathy, with long-term current use of insulin (H[facility])   Atrial fibrillation with RVR ([place])   Acute on chronic systolic heart failure ([place])   Type 2 diabetes mellitus with stage 3a chronic kidney disease, with long-term current use of insulin ([place])   Status post neurological surgery   Endotracheally intubated   Cerebral edema ([place])   Dysphagia due to recent stroke   Aneurysm of right carotid artery ([place]) paraclinoid   Medications to be continued on Rehab Allergies as of 01/05/2020      Reactions   Bee Venom Shortness Of Breath, Swelling   Bodily Swelling   Losartan Other (See Comments)   Nosebleeds per patient report.    Naproxen Other (See Comments)   Effects acid reflux   Penicillins Nausea Only   Has patient had a PCN reaction causing immediate rash, facial/tongue/throat swelling, SOB or lightheadedness with hypotension: no Has patient had a PCN reaction causing severe rash involving mucus membranes or skin necrosis: no Has patient had a PCN reaction that required hospitalization no Has patient had a PCN reaction occurring within the last 10 years: no If all of the above answers are "NO", then may proceed with  Cephalosporin    Lisinopril    Sinus congestion      Medication List    STOP taking these medications   albuterol (2.5 MG/3ML) 0.083% nebulizer solution Commonly known as: PROVENTIL   albuterol 108 (90 Base) MCG/ACT inhaler Commonly known as: VENTOLIN HFA   cephALEXin 500 MG capsule Commonly known as: KEFLEX   Contour Next Test test strip Generic drug: glucose blood   gabapentin 100 MG capsule Commonly known as: NEURONTIN Replaced by: gabapentin 250 MG/5ML solution   hydrALAZINE 50 MG tablet Commonly known as: APRESOLINE   Insulin Pen Needle 32G X 4 MM Misc   NovoLOG Mix 70/30 FlexPen (70-30) 100 UNIT/ML FlexPen Generic drug: insulin aspart protamine - aspart   pantoprazole 40 MG tablet Commonly known as: PROTONIX Replaced by: pantoprazole sodium 40 mg/20 mL Pack   potassium chloride SA 20 MEQ tablet Commonly known as: KLOR-CON   tizanidine 2 MG capsule Commonly known as: ZANAFLEX Replaced by: tiZANidine 2 MG tablet   triamcinolone cream 0.1 % Commonly known as: KENALOG   Trulicity [id number] Sopn Generic drug: Dulaglutide     TAKE these medications   acetaminophen 325 MG tablet Commonly known as: TYLENOL Take 2 tablets (650 mg total) by mouth every 4 (four) hours as needed for mild pain (or temp > 37.5 C (99.5 F)). What changed:   when to take this  reasons to take this   acetaminophen 650 MG suppository Commonly known as: TYLENOL Place 1 suppository (650 mg total) rectally every 4 (four) hours  as needed for mild pain (or temp > 37.5 C (99.5 F)). What changed: You were already taking a medication with the same name, and this prescription was added. Make sure you understand how and when to take each.   acetaminophen 160 MG/5ML solution Commonly known as: TYLENOL Place 20.3 mLs (650 mg total) into feeding tube every 4 (four) hours as needed for mild pain (or temp > 37.5 C (99.5 F)). What changed: You were already taking a medication with the same  name, and this prescription was added. Make sure you understand how and when to take each.   apixaban 5 MG Tabs tablet Commonly known as: ELIQUIS Place 1 tablet (5 mg total) into feeding tube 2 (two) times daily. What changed: how to take this   carvedilol 25 MG tablet Commonly known as: COREG Place 2 tablets (50 mg total) into feeding tube 2 (two) times daily with a meal. What changed: how to take this   chlorhexidine 0.12 % solution Commonly known as: PERIDEX 15 mLs by Mouth Rinse route 2 (two) times daily.   Chlorhexidine Gluconate Cloth 2 % Pads Apply 6 each topically daily. Start taking on: January 06, 2020   digoxin 0.125 MG tablet Commonly known as: LANOXIN Place 1 tablet (0.125 mg total) into feeding tube daily. Start taking on: January 06, 2020 What changed: how to take this   feeding supplement (GLUCERNA 1.2 CAL) Liqd Place 1,000 mLs into feeding tube continuous.   feeding supplement (PRO-STAT SUGAR FREE 64) Liqd Place 30 mLs into feeding tube daily. Start taking on: January 06, 2020   free water Soln Place 200 mLs into feeding tube every 6 (six) hours.   gabapentin 250 MG/5ML solution Commonly known as: NEURONTIN Place 2 mLs (100 mg total) into feeding tube every 8 (eight) hours. Replaces: gabapentin 100 MG capsule   insulin aspart 100 UNIT/ML injection Commonly known as: novoLOG Inject 0-20 Units into the skin every 4 (four) hours.   insulin aspart 100 UNIT/ML injection Commonly known as: novoLOG Inject 2 Units into the skin every 4 (four) hours.   insulin glargine 100 UNIT/ML injection Commonly known as: LANTUS Inject 0.6 mLs (60 Units total) into the skin at bedtime.   ipratropium-albuterol 0.5-2.5 (3) MG/3ML Soln Commonly known as: DUONEB Take 3 mLs by nebulization every 4 (four) hours as needed.   isosorbide-hydrALAZINE 20-37.5 MG tablet Commonly known as: BIDIL Place 2 tablets into feeding tube 3 (three) times daily.   metFORMIN 500 MG  tablet Commonly known as: GLUCOPHAGE Take 1 tablet (500 mg total) by mouth 2 (two) times daily with a meal. What changed:   medication strength  how much to take  when to take this   mouth rinse Liqd solution 15 mLs by Mouth Rinse route 2 times daily at 12 noon and 4 pm.   multivitamin with minerals Tabs tablet Place 1 tablet into feeding tube daily. Start taking on: January 06, 2020   pantoprazole sodium 40 mg/20 mL Pack Commonly known as: PROTONIX Place 20 mLs (40 mg total) into feeding tube daily. Start taking on: January 06, 2020 Replaces: pantoprazole 40 MG tablet   potassium chloride 20 MEQ packet Commonly known as: KLOR-CON Place 40 mEq into feeding tube daily. Start taking on: January 06, 2020   Resource ThickenUp Clear Powd Take 120 g by mouth as needed (pudding thick liquids).   rosuvastatin 20 MG tablet Commonly known as: CRESTOR Place 1 tablet (20 mg total) into feeding tube daily. Start taking on:  January 06, 2020   senna-docusate 8.6-50 MG tablet Commonly known as: Senokot-S Place 1 tablet into feeding tube at bedtime as needed for mild constipation.   sodium chloride 0.9 % infusion Inject 10 mLs into the vein as needed (for administration of IV medications (carrier fluid)).   sodium chloride flush 0.9 % Soln Commonly known as: NS 10-40 mLs by Intracatheter route as needed (flush).   tiZANidine 2 MG tablet Commonly known as: ZANAFLEX Place 1 tablet (2 mg total) into feeding tube 2 (two) times daily as needed (muscle spasm). Replaces: tizanidine 2 MG capsule   torsemide 20 MG tablet Commonly known as: DEMADEX Place 1 tablet (20 mg total) into feeding tube daily. Start taking on: January 06, 2020 What changed:   how to take this  when to take this       LABORATORY STUDIES CBC    Component Value Date/Time   WBC 9.2 01/05/2020 0603   RBC 4.51 01/05/2020 0603   HGB 13.2 01/05/2020 0603   HCT 42.2 01/05/2020 0603   PLT 215 01/05/2020 0603   MCV 93.6  01/05/2020 0603   MCH 29.3 01/05/2020 0603   MCHC 31.3 01/05/2020 0603   RDW 13.7 01/05/2020 0603   LYMPHSABS 1.4 12/27/2019 0305   MONOABS 0.3 12/27/2019 0305   EOSABS 0.0 12/27/2019 0305   BASOSABS 0.0 12/27/2019 0305   CMP    Component Value Date/Time   NA 142 01/04/2020 1418   NA 134 (A) 08/24/2018 0000   K 4.9 01/04/2020 1418   CL 108 01/04/2020 1418   CO2 23 01/04/2020 1418   GLUCOSE 220 (H) 01/04/2020 1418   BUN 28 (H) 01/04/2020 1418   BUN 15 08/24/2018 0000   CREATININE 1.20 (H) 01/04/2020 1418   CREATININE 0.81 07/11/2011 0725   CALCIUM 9.2 01/04/2020 1418   PROT 6.4 (L) 01/04/2020 0648   ALBUMIN 2.9 (L) 01/04/2020 0648   AST 46 (H) 01/04/2020 0648   ALT 33 01/04/2020 0648   ALKPHOS 65 01/04/2020 0648   BILITOT 0.8 01/04/2020 0648   GFRNONAA 52 (L) 01/04/2020 1418   GFRAA 60 (L) 01/04/2020 1418   COAGS Lab Results  Component Value Date   INR 1.2 12/26/2019   INR 1.14 02/23/2010   Lipid Panel    Component Value Date/Time   CHOL 121 12/27/2019 0305   TRIG 128 12/27/2019 0305   HDL 27 (L) 12/27/2019 0305   CHOLHDL 4.5 12/27/2019 0305   VLDL 26 12/27/2019 0305   LDLCALC 68 12/27/2019 0305   HgbA1C  Lab Results  Component Value Date   HGBA1C 11.6 (H) 12/27/2019   Urinalysis    Component Value Date/Time   COLORURINE YELLOW 12/26/2019 2249   APPEARANCEUR CLEAR 12/26/2019 2249   LABSPEC 1.020 12/26/2019 2249   PHURINE 6.0 12/26/2019 2249   GLUCOSEU >=500 (A) 12/26/2019 2249   GLUCOSEU >=1000 (A) 09/21/2018 0829   HGBUR NEGATIVE 12/26/2019 2249   BILIRUBINUR NEGATIVE 12/26/2019 2249   KETONESUR NEGATIVE 12/26/2019 2249   PROTEINUR 100 (A) 12/26/2019 2249   UROBILINOGEN 0.2 09/21/2018 0829   NITRITE NEGATIVE 12/26/2019 2249   LEUKOCYTESUR TRACE (A) 12/26/2019 2249   Urine Drug Screen     Component Value Date/Time   LABOPIA NONE DETECTED 12/26/2019 2249   COCAINSCRNUR POSITIVE (A) 12/26/2019 2249   LABBENZ NONE DETECTED 12/26/2019 2249    AMPHETMU NONE DETECTED 12/26/2019 2249   THCU NONE DETECTED 12/26/2019 2249   LABBARB NONE DETECTED 12/26/2019 2249    Alcohol Level  Component Value Date/Time   [facility] <10 12/26/2019 2255   SIGNIFICANT DIAGNOSTIC STUDIES CT Angio Head W or Wo Contrast  Result Date: 12/27/2019 CLINICAL DATA:  Initial evaluation for acute stroke, unresponsiveness. EXAM: CT ANGIOGRAPHY HEAD AND NECK CT PERFUSION BRAIN TECHNIQUE: Multidetector CT imaging of the head and neck was performed using the standard protocol during bolus administration of intravenous contrast. Multiplanar CT image reconstructions and MIPs were obtained to evaluate the vascular anatomy. Carotid stenosis measurements (when applicable) are obtained utilizing NASCET criteria, using the distal internal carotid diameter as the denominator. Multiphase CT imaging of the brain was performed following IV bolus contrast injection. Subsequent parametric perfusion maps were calculated using RAPID software. CONTRAST:  157mL OMNIPAQUE IOHEXOL 350 MG/ML SOLN COMPARISON:  Prior head CT from earlier the same day. FINDINGS: CTA NECK FINDINGS Aortic arch: Visualized aortic arch of normal caliber with normal branch pattern. Mild atheromatous change about the arch and origin of the great vessels without stenosis. Visualized subclavian arteries widely patent. Right carotid system: Right common and internal carotid arteries widely patent without stenosis, dissection or occlusion. Left carotid system: Left common carotid artery patent from its origin to the bifurcation without stenosis. Mild atheromatous change about the left bifurcation without significant stenosis. Left ICA widely patent distally to the skull base without stenosis, dissection or occlusion. Vertebral arteries: Both vertebral arteries arise from the subclavian arteries. Right vertebral artery dominant, with a diffusely hypoplastic left vertebral artery. Multifocal moderate to severe segmental stenoses seen  involving the hypoplastic left vertebral artery, most pronounced proximally. On the right, there is a single short-segment severe stenosis involving the right V1 segment (series 4, image 272). Otherwise, right vertebral artery patent within the neck without stenosis or other abnormality. Skeleton: No acute osseous finding. Focal sclerotic lesion within the T2 vertebral body noted, nonspecific, but likely benign. No other worrisome lytic or blastic osseous lesions. Other neck: No other acute soft tissue abnormality within the neck. No mass lesion or adenopathy. 7 mm left thyroid nodule noted, felt to be of doubtful significance given size and patient age. No follow-up imaging recommended regarding this lesion. Upper chest: Visualized upper chest demonstrates no acute finding. Partially visualized lungs are largely clear. Review of the MIP images confirms the above findings CTA HEAD FINDINGS Anterior circulation: On the right, the petrous, cavernous, and supraclinoid segments are widely patent to the terminus. 5 x 6 x 7 mm saccular aneurysm seen arising from the para clinoid right ICA (series 4, image 116). This is directed medially and posteriorly. On the left, patent but somewhat attenuated flow within the petrous and cavernous left ICA, with subsequent occlusion at the supraclinoid segment/left ICA terminus (series 4, image 115). A1 segments are opacified bilaterally, with filling of the left A1 via collateralization across the circle-of-Willis. A2 segments well perfused to their distal aspects. Opacification of the mid and distal left M1 segment is seen, which could be related to subocclusive thrombus and/or collateralization. Normal left MCA bifurcation. Attenuated flow seen within the left MCA branches distally, likely via collateralization. Right M1 widely patent. Normal right MCA bifurcation. Distal right MCA branches well perfused. Small vessel atheromatous irregularity noted. Posterior circulation: Vertebral  arteries patent to the vertebrobasilar junction without stenosis. Both picas patent. Basilar diminutive but patent to its distal aspect without stenosis. Superior cerebral arteries patent at their origins. Both PCAs primarily supplied via the basilar well perfused to their distal aspects. Venous sinuses: Grossly patent allowing for timing of the contrast bolus. Anatomic variants: None significant.  Review of the MIP images confirms the above findings CT Brain Perfusion Findings: ASPECTS: 6 CBF (<30%) Volume: 76mL Perfusion (Tmax>6.0s) volume: 227mL Mismatch Volume: 251mL Infarction Location:Patchy core infarct seen involving the deep and subcortical white matter of the anterior left frontal lobe. Large surrounding ischemic penumbra throughout the majority of the left MCA distribution. IMPRESSION: CTA HEAD AND NECK IMPRESSION: 1. Positive CTA with occlusive thrombus at the left ICA terminus. Distal reconstitution at the mid and distal left M1 segment with attenuated flow distally within the left MCA branches, which could be related to subocclusive thrombus and/or collateralization. 2. Mild atheromatous change about the left carotid bifurcation without stenosis. No other hemodynamically significant stenosis involving the left carotid system. 3. Multifocal moderate to severe stenoses involving the hypoplastic left vertebral artery, most notable proximally. Single short-segment severe proximal right V1 stenosis. Dominant right vertebral artery otherwise widely patent. 4. 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 CT PERFUSION IMPRESSION: 1. 24 cc acute core infarct involving the deep and subcortical white matter of the anterior left frontal region. Two hundred fourteen cc surrounding ischemic penumbra involving essentially the entirety of the left MCA distribution. 2. Aspects = 6 on prior head CT. Critical Value/emergent results were called by telephone at the time of interpretation on 12/26/2019 at  11:48 pm to provider Dr. Tomi Bamberger, Who verbally acknowledged these results. Critical Value/emergent results were called by telephone at the time of interpretation on 12/26/2019 at 11:54 pm to provider Dr. Cheral Marker, Who verbally acknowledged these results. Electronically Signed   By: Jeannine Boga M.D.   On: 12/27/2019 00:41   CT HEAD WO CONTRAST  Result Date: 12/31/2019 CLINICAL DATA:  Stroke follow-up EXAM: CT HEAD WITHOUT CONTRAST TECHNIQUE: Contiguous axial images were obtained from the base of the skull through the vertex without intravenous contrast. COMPARISON:  Head CT 12/29/2019 FINDINGS: Brain: Unchanged appearance of left MCA territory infarct with cytotoxic edema causing leftward midline shift of 7 mm. Unchanged pattern of petechial hemorrhage. No intraparenchymal hematoma. No hydrocephalus. Vascular: No hyperdense vessel or unexpected calcification. Skull: Normal. Negative for fracture or focal lesion. Sinuses/Orbits: No acute finding. Other: None. IMPRESSION: 1. Unchanged appearance of left MCA territory infarct with petechial hemorrhage. 2. Unchanged leftward midline shift of 7 mm. Electronically Signed   By: Ulyses Jarred M.D.   On: 12/31/2019 03:34   CT HEAD WO CONTRAST  Result Date: 12/29/2019 CLINICAL DATA:  Stroke follow-up EXAM: CT HEAD WITHOUT CONTRAST TECHNIQUE: Contiguous axial images were obtained from the base of the skull through the vertex without intravenous contrast. COMPARISON:  12/28/2019 FINDINGS: Brain: Large anterior left MCA territory infarct with associated cytotoxic edema and petechial hemorrhage. There is 8 mm of rightward midline shift, unchanged. Mass effect on the lateral ventricles is unchanged. No hydrocephalus or ventricular entrapment. Vascular: No abnormal hyperdensity of the major intracranial arteries or dural venous sinuses. No intracranial atherosclerosis. Skull: The visualized skull base, calvarium and extracranial soft tissues are normal. Sinuses/Orbits:  No fluid levels or advanced mucosal thickening of the visualized paranasal sinuses. No mastoid or middle ear effusion. The orbits are normal. IMPRESSION: 1. Unchanged appearance of large anterior left MCA territory infarct with petechial hemorrhage. 2. Unchanged 8 mm of rightward midline shift. Electronically Signed   By: Ulyses Jarred M.D.   On: 12/29/2019 03:44   CT Head Wo Contrast  Result Date: 12/28/2019 CLINICAL DATA:  Stroke follow-up EXAM: CT HEAD WITHOUT CONTRAST TECHNIQUE: Contiguous axial images were obtained from the  base of the skull through the vertex without intravenous contrast. COMPARISON:  Brain MRI from yesterday FINDINGS: Brain: Cytotoxic edema in the anterior left MCA division and basal ganglia. There is petechial hemorrhage better seen by prior MRI. No discrete hematoma. Swelling causes 7 mm of midline shift. No entrapment. Vascular: Dense appearance of the left MCA which may be related to adjacent edematous brain. Skull: Negative Sinuses/Orbits: Negative IMPRESSION: Acute left MCA territory infarct with petechial hemorrhage. No evident progression from brain MRI yesterday. Midline shift measures 7 mm. Electronically Signed   By: Monte Fantasia M.D.   On: 12/28/2019 04:29   CT Angio Neck W and/or Wo Contrast  Result Date: 12/27/2019 CLINICAL DATA:  Initial evaluation for acute stroke, unresponsiveness. EXAM: CT ANGIOGRAPHY HEAD AND NECK CT PERFUSION BRAIN TECHNIQUE: Multidetector CT imaging of the head and neck was performed using the standard protocol during bolus administration of intravenous contrast. Multiplanar CT image reconstructions and MIPs were obtained to evaluate the vascular anatomy. Carotid stenosis measurements (when applicable) are obtained utilizing NASCET criteria, using the distal internal carotid diameter as the denominator. Multiphase CT imaging of the brain was performed following IV bolus contrast injection. Subsequent parametric perfusion maps were calculated  using RAPID software. CONTRAST:  13mL OMNIPAQUE IOHEXOL 350 MG/ML SOLN COMPARISON:  Prior head CT from earlier the same day. FINDINGS: CTA NECK FINDINGS Aortic arch: Visualized aortic arch of normal caliber with normal branch pattern. Mild atheromatous change about the arch and origin of the great vessels without stenosis. Visualized subclavian arteries widely patent. Right carotid system: Right common and internal carotid arteries widely patent without stenosis, dissection or occlusion. Left carotid system: Left common carotid artery patent from its origin to the bifurcation without stenosis. Mild atheromatous change about the left bifurcation without significant stenosis. Left ICA widely patent distally to the skull base without stenosis, dissection or occlusion. Vertebral arteries: Both vertebral arteries arise from the subclavian arteries. Right vertebral artery dominant, with a diffusely hypoplastic left vertebral artery. Multifocal moderate to severe segmental stenoses seen involving the hypoplastic left vertebral artery, most pronounced proximally. On the right, there is a single short-segment severe stenosis involving the right V1 segment (series 4, image 272). Otherwise, right vertebral artery patent within the neck without stenosis or other abnormality. Skeleton: No acute osseous finding. Focal sclerotic lesion within the T2 vertebral body noted, nonspecific, but likely benign. No other worrisome lytic or blastic osseous lesions. Other neck: No other acute soft tissue abnormality within the neck. No mass lesion or adenopathy. 7 mm left thyroid nodule noted, felt to be of doubtful significance given size and patient age. No follow-up imaging recommended regarding this lesion. Upper chest: Visualized upper chest demonstrates no acute finding. Partially visualized lungs are largely clear. Review of the MIP images confirms the above findings CTA HEAD FINDINGS Anterior circulation: On the right, the petrous,  cavernous, and supraclinoid segments are widely patent to the terminus. 5 x 6 x 7 mm saccular aneurysm seen arising from the para clinoid right ICA (series 4, image 116). This is directed medially and posteriorly. On the left, patent but somewhat attenuated flow within the petrous and cavernous left ICA, with subsequent occlusion at the supraclinoid segment/left ICA terminus (series 4, image 115). A1 segments are opacified bilaterally, with filling of the left A1 via collateralization across the circle-of-Willis. A2 segments well perfused to their distal aspects. Opacification of the mid and distal left M1 segment is seen, which could be related to subocclusive thrombus and/or collateralization. Normal left  MCA bifurcation. Attenuated flow seen within the left MCA branches distally, likely via collateralization. Right M1 widely patent. Normal right MCA bifurcation. Distal right MCA branches well perfused. Small vessel atheromatous irregularity noted. Posterior circulation: Vertebral arteries patent to the vertebrobasilar junction without stenosis. Both picas patent. Basilar diminutive but patent to its distal aspect without stenosis. Superior cerebral arteries patent at their origins. Both PCAs primarily supplied via the basilar well perfused to their distal aspects. Venous sinuses: Grossly patent allowing for timing of the contrast bolus. Anatomic variants: None significant. Review of the MIP images confirms the above findings CT Brain Perfusion Findings: ASPECTS: 6 CBF (<30%) Volume: 15mL Perfusion (Tmax>6.0s) volume: 259mL Mismatch Volume: 217mL Infarction Location:Patchy core infarct seen involving the deep and subcortical white matter of the anterior left frontal lobe. Large surrounding ischemic penumbra throughout the majority of the left MCA distribution. IMPRESSION: CTA HEAD AND NECK IMPRESSION: 1. Positive CTA with occlusive thrombus at the left ICA terminus. Distal reconstitution at the mid and distal left  M1 segment with attenuated flow distally within the left MCA branches, which could be related to subocclusive thrombus and/or collateralization. 2. Mild atheromatous change about the left carotid bifurcation without stenosis. No other hemodynamically significant stenosis involving the left carotid system. 3. Multifocal moderate to severe stenoses involving the hypoplastic left vertebral artery, most notable proximally. Single short-segment severe proximal right V1 stenosis. Dominant right vertebral artery otherwise widely patent. 4. 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 CT PERFUSION IMPRESSION: 1. 24 cc acute core infarct involving the deep and subcortical white matter of the anterior left frontal region. Two hundred fourteen cc surrounding ischemic penumbra involving essentially the entirety of the left MCA distribution. 2. Aspects = 6 on prior head CT. Critical Value/emergent results were called by telephone at the time of interpretation on 12/26/2019 at 11:48 pm to provider Dr. Tomi Bamberger, Who verbally acknowledged these results. Critical Value/emergent results were called by telephone at the time of interpretation on 12/26/2019 at 11:54 pm to provider Dr. Cheral Marker, Who verbally acknowledged these results. Electronically Signed   By: Jeannine Boga M.D.   On: 12/27/2019 00:41   MR BRAIN WO CONTRAST  Result Date: 12/27/2019 CLINICAL DATA:  Stroke follow-up; status post left MCA thrombectomy EXAM: MRI HEAD WITHOUT CONTRAST TECHNIQUE: Multiplanar, multiecho pulse sequences of the brain and surrounding structures were obtained without intravenous contrast. COMPARISON:  Head CT Dec 26, 2019 FINDINGS: Brain: Large area of restricted diffusion involving the left basal ganglia, insula, anterior temporal lobe including amygdala, inferior frontal lobe with cortical restricted diffusion in the lateral frontal and parietal regions, consistent with evolving left MCA territory infarct. There is  susceptibility artifact along the cortex and basal ganglia, consistent with hemorrhagic transformation. There is mass effect with effacement of the regional sulci and partial effacement of the left lateral ventricle with a 6 mm rightward midline shift. There is no hydrocephalus. Remainder of the brain parenchyma is maintained. Vascular: Normal flow voids noting a 3 mm left MCA bifurcation aneurysm and a 7 mm paraclinoid right ICA aneurysm. Skull and upper cervical spine: Normal marrow signal. Sinuses/Orbits: Mild mucosal thickening of the ethmoid cells. The orbits are maintained. Other: Endotracheal tube noted. IMPRESSION: 1. Evolving left MCA territory infarct with hemorrhagic transformation. Mass effect with 6 mm rightward midline shift. 2. Normal flow voids noting a 3 mm left MCA bifurcation aneurysm and a 7 mm paraclinoid right ICA aneurysm. Electronically Signed   By: Jeanella Craze  Debbrah Alar M.D.   On: 12/27/2019 15:29   IR CT Head Ltd  Result Date: 12/27/2019 INDICATION: 54 year old female with a history of acute left MCA stroke, ELVO, presenting for mechanical thrombectomy EXAM: ULTRASOUND GUIDED ACCESS RIGHT COMMON FEMORAL ARTERY CERVICAL AND CEREBRAL ANGIOGRAM MECHANICAL THROMBECTOMY LEFT ICA ANGIO-SEAL FOR HEMOSTASIS COMPARISON:  CT IMAGING SAME DAY MEDICATIONS: NONE ANESTHESIA/SEDATION: The anesthesia team was present to provide general endotracheal tube anesthesia and for patient monitoring during the procedure. Intubation was performed in Neuro biplane room. Radial arterial line was performed by the anesthesia team. interventional neuro radiology nursing staff was also present. CONTRAST:  50 cc Omnipaque 300 FLUOROSCOPY TIME:  Fluoroscopy Time: 8 minutes 48 seconds (901 mGy). COMPLICATIONS: None TECHNIQUE: Informed written consent was obtained from the patient's family after a thorough discussion of the procedural risks, benefits and alternatives. Specific risks discussed include: Bleeding,  infection, contrast reaction, kidney injury/failure, need for further procedure/surgery, arterial injury or dissection, embolization to new territory, intracranial hemorrhage (10-15% risk), neurologic deterioration, cardiopulmonary collapse, death. All questions were addressed. Maximal Sterile Barrier Technique was utilized including during the procedure including caps, mask, sterile gowns, sterile gloves, sterile drape, hand hygiene and skin antiseptic. A timeout was performed prior to the initiation of the procedure. The anesthesia team was present to provide general endotracheal tube anesthesia and for patient monitoring during the procedure. Interventional neuro radiology nursing staff was also present. FINDINGS: Initial Findings: Left internal carotid artery: Initial angiogram demonstrates ICA terminus occlusion, without significant plaque of the petrous segment, last seroma segment, cavernous segment or the proximal supraclinoid segment. Ophthalmic artery is patent, with choroid blush. Patent posterior communicating artery with flow into the left PCA. Left MCA: Initial imaging demonstrates no significant primary filling of the MCA. There are leptomeningeal collateral flows partially filling the superior division of the MCA territory. Anterior choroidal artery remains patent with some anterior-posterior flow through the choroidal system into the parietal territory. Left ACA: Initial imaging demonstrates no significant primary filling of the ACA territory. Completion Findings: Left MCA: After 2 passes there is complete restoration of flow through the ICA terminus, incomplete restoration of flow through the right MCA branches. Left ACA: There is restoration of flow into the A1 segment. The right ACA territory is partially served by left-sided ACA branches in the pericallosal distribution. TICI 3 flow restored Flat panel CT demonstrates no subarachnoid hemorrhage or significant contrast staining. PROCEDURE: The  anesthesia team was present to provide general endotracheal tube anesthesia and for patient monitoring during the procedure. Intubation was performed in negative pressure [place] in neuro IR holding. Interventional neuro radiology nursing staff was also present. Ultrasound survey of the right inguinal region was performed with images stored and sent to PACs. 11 blade scalpel was used to make a small incision. Blunt dissection was performed with US guidance. A micropuncture needle was used access the right common femoral artery under ultrasound. With excellent arterial blood flow returned, an .018 micro wire was passed through the needle, observed to enter the abdominal aorta under fluoroscopy. The needle was removed, and a micropuncture sheath was placed over the wire. The inner dilator and wire were removed, and an 035 wire was advanced under fluoroscopy into the abdominal aorta. The sheath was removed and a 25cm 69F straight vascular sheath was placed. The dilator was removed and the sheath was flushed. Sheath was attached to pressurized and heparinized saline bag for constant forward flow. A coaxial system was then advanced over the 035 wire. This included a 95cm 087 "  Walrus" balloon guide with coaxial 125cm Berenstein diagnostic catheter. This was advanced to the proximal descending thoracic aorta. Wire was then removed. Double flush of the catheter was performed. Catheter was then used to select the left common carotid artery. Angiogram was performed. The catheter and balloon guide combination was advanced over a standard glide wire into left cervical ICA, with distal position achieved of the balloon guide. The diagnostic catheter and the wire were removed. Formal angiogram was performed. Road map function was used once the occluded vessel was identified. Copious back flush was performed and the balloon catheter was attached to heparinized and pressurized saline bag for forward flow. A second coaxial system was then  advanced through the balloon catheter, which included the selected intermediate catheter, microcatheter, and microwire. In this scenario, the set up included a 71 zoom catheter, a Trevo Provue18 microcatheter, and 014 synchro soft wire. This system was advanced through the balloon guide catheter under the road-map function, with adequate back-flush at the rotating hemostatic valve at that back end of the balloon guide. Microcatheter and the intermediate catheter system were advanced through the siphon to the level of the occlusion. The microwire was passed through the occlusion into the proximal MCA. Aspiration catheter was then advanced through the ophthalmic segment. Microcatheter and the microwire were removed. The aspiration catheter was attached to the proprietary engine for aspiration confirming adequate backflow. Catheter was then advanced to the site of the occlusion, confirming stasis of backflow. 1 minutes time frame was observed. The catheter was then withdrawn into the ophthalmic segment under fluoroscopy. No significant flow was observed. The balloon on the balloon guide was gently inflated under fluoroscopy. Aspiration catheter was removed from the system while aspiration was performed at the balloon guide hub. The aspiration catheter was completely removed, adequate blood return was confirmed through the balloon guide, and then the balloon on the balloon guide was deflated. Repeat control angiogram was performed confirming continued occlusion at the ICA terminus. We elected to perform a 2nd pass. Coaxial system was again advanced through the balloon catheter, which included the selected intermediate catheter, microcatheter, and microwire. In this scenario, the set up included a CAT 6, a Trevo Provue18 microcatheter, and 014 synchro soft wire. This system was advanced through the balloon guide catheter under the road-map function, with adequate back-flush at the rotating hemostatic valve at that back  end of the balloon guide. Blood was then aspirated through the hub of the microcatheter, and a gentle contrast injection was performed confirming intraluminal position. A rotating hemostatic valve was then attached to the back end of the microcatheter, and a pressurized and heparinized saline bag was attached to the catheter. 4 x 40 solitaire device was then selected. Back flush was achieved at the rotating hemostatic valve, and then the device was gently advanced through the microcatheter to the distal end. The retriever was then unsheathed by withdrawing the microcatheter under fluoroscopy. Once the retriever was completely unsheathed, the microcatheter was carefully stripped from the delivery device. Control angiogram was performed from the intermediate catheter. A 3 minute time interval was observed. The balloon at the balloon guide catheter was then inflated under fluoroscopy for proximal flow arrest. Constant aspiration using the proprietary engine was then performed at the intermediate catheter, as the retriever was gently and slowly withdrawn with fluoroscopic observation. Once the retriever was "corked" within the tip of the intermediate catheter, both were removed from the system. Free aspiration was confirmed at the hub of the balloon guide catheter,  with free blood return confirmed. The balloon was then deflated, and a control angiogram was performed. Restoration of flow was confirmed. Angiogram of the cervical ICA was performed. Balloon guide was withdrawn into the carotid artery and repeat angiogram was performed. Balloon guide was then removed. The skin at the puncture site was then cleaned with Chlorhexidine. The 8 French sheath was removed and an 51F angioseal was deployed. Flat panel CT was performed. Patient was extubated once the CT was reviewed. Patient tolerated the procedure well and remained hemodynamically stable throughout. No complications were encountered and no significant blood loss  encountered. IMPRESSION: Status post ultrasound guided access right common femoral artery for left-sided cervical/cerebral angiogram and mechanical thrombectomy of ICA terminus occlusion restoring complete flow (TICI 3) into the ACA territory and MCA territory of the left hemisphere. Angio-Seal deployed for hemostasis. Signed, Dulcy Fanny. Dellia Nims, RPVI [facility] Radiology PLAN: The patient will remain intubated ICU status Target systolic blood pressure of 120-140 Right hip straight time 6 hours Frequent neurovascular checks Repeat neurologic imaging with CT and/MRI at the discretion of neurology team Electronically Signed   By: Corrie Mckusick D.O.   On: 12/27/2019 02:46   IR US Guide Vasc Access Right  Result Date: 12/27/2019 INDICATION: 54 year old female with a history of acute left MCA stroke, ELVO, presenting for mechanical thrombectomy EXAM: ULTRASOUND GUIDED ACCESS RIGHT COMMON FEMORAL ARTERY CERVICAL AND CEREBRAL ANGIOGRAM MECHANICAL THROMBECTOMY LEFT ICA ANGIO-SEAL FOR HEMOSTASIS COMPARISON:  CT IMAGING SAME DAY MEDICATIONS: NONE ANESTHESIA/SEDATION: The anesthesia team was present to provide general endotracheal tube anesthesia and for patient monitoring during the procedure. Intubation was performed in Neuro biplane room. Radial arterial line was performed by the anesthesia team. interventional neuro radiology nursing staff was also present. CONTRAST:  50 cc Omnipaque 300 FLUOROSCOPY TIME:  Fluoroscopy Time: 8 minutes 48 seconds (901 mGy). COMPLICATIONS: None TECHNIQUE: Informed written consent was obtained from the patient's family after a thorough discussion of the procedural risks, benefits and alternatives. Specific risks discussed include: Bleeding, infection, contrast reaction, kidney injury/failure, need for further procedure/surgery, arterial injury or dissection, embolization to new territory, intracranial hemorrhage (10-15% risk), neurologic  deterioration, cardiopulmonary collapse, death. All questions were addressed. Maximal Sterile Barrier Technique was utilized including during the procedure including caps, mask, sterile gowns, sterile gloves, sterile drape, hand hygiene and skin antiseptic. A timeout was performed prior to the initiation of the procedure. The anesthesia team was present to provide general endotracheal tube anesthesia and for patient monitoring during the procedure. Interventional neuro radiology nursing staff was also present. FINDINGS: Initial Findings: Left internal carotid artery: Initial angiogram demonstrates ICA terminus occlusion, without significant plaque of the petrous segment, last seroma segment, cavernous segment or the proximal supraclinoid segment. Ophthalmic artery is patent, with choroid blush. Patent posterior communicating artery with flow into the left PCA. Left MCA: Initial imaging demonstrates no significant primary filling of the MCA. There are leptomeningeal collateral flows partially filling the superior division of the MCA territory. Anterior choroidal artery remains patent with some anterior-posterior flow through the choroidal system into the parietal territory. Left ACA: Initial imaging demonstrates no significant primary filling of the ACA territory. Completion Findings: Left MCA: After 2 passes there is complete restoration of flow through the ICA terminus, incomplete restoration of flow through the right MCA branches. Left ACA: There is restoration of flow into the A1 segment. The right ACA territory is partially served by left-sided ACA branches in the pericallosal distribution. TICI 3 flow restored  Flat panel CT demonstrates no subarachnoid hemorrhage or significant contrast staining. PROCEDURE: The anesthesia team was present to provide general endotracheal tube anesthesia and for patient monitoring during the procedure. Intubation was performed in negative pressure [place] in neuro IR holding.  Interventional neuro radiology nursing staff was also present. Ultrasound survey of the right inguinal region was performed with images stored and sent to PACs. 11 blade scalpel was used to make a small incision. Blunt dissection was performed with US guidance. A micropuncture needle was used access the right common femoral artery under ultrasound. With excellent arterial blood flow returned, an .018 micro wire was passed through the needle, observed to enter the abdominal aorta under fluoroscopy. The needle was removed, and a micropuncture sheath was placed over the wire. The inner dilator and wire were removed, and an 035 wire was advanced under fluoroscopy into the abdominal aorta. The sheath was removed and a 25cm 15F straight vascular sheath was placed. The dilator was removed and the sheath was flushed. Sheath was attached to pressurized and heparinized saline bag for constant forward flow. A coaxial system was then advanced over the 035 wire. This included a 95cm 087 "Walrus" balloon guide with coaxial 125cm Berenstein diagnostic catheter. This was advanced to the proximal descending thoracic aorta. Wire was then removed. Double flush of the catheter was performed. Catheter was then used to select the left common carotid artery. Angiogram was performed. The catheter and balloon guide combination was advanced over a standard glide wire into left cervical ICA, with distal position achieved of the balloon guide. The diagnostic catheter and the wire were removed. Formal angiogram was performed. Road map function was used once the occluded vessel was identified. Copious back flush was performed and the balloon catheter was attached to heparinized and pressurized saline bag for forward flow. A second coaxial system was then advanced through the balloon catheter, which included the selected intermediate catheter, microcatheter, and microwire. In this scenario, the set up included a 71 zoom catheter, a Trevo Provue18  microcatheter, and 014 synchro soft wire. This system was advanced through the balloon guide catheter under the road-map function, with adequate back-flush at the rotating hemostatic valve at that back end of the balloon guide. Microcatheter and the intermediate catheter system were advanced through the siphon to the level of the occlusion. The microwire was passed through the occlusion into the proximal MCA. Aspiration catheter was then advanced through the ophthalmic segment. Microcatheter and the microwire were removed. The aspiration catheter was attached to the proprietary engine for aspiration confirming adequate backflow. Catheter was then advanced to the site of the occlusion, confirming stasis of backflow. 1 minutes time frame was observed. The catheter was then withdrawn into the ophthalmic segment under fluoroscopy. No significant flow was observed. The balloon on the balloon guide was gently inflated under fluoroscopy. Aspiration catheter was removed from the system while aspiration was performed at the balloon guide hub. The aspiration catheter was completely removed, adequate blood return was confirmed through the balloon guide, and then the balloon on the balloon guide was deflated. Repeat control angiogram was performed confirming continued occlusion at the ICA terminus. We elected to perform a 2nd pass. Coaxial system was again advanced through the balloon catheter, which included the selected intermediate catheter, microcatheter, and microwire. In this scenario, the set up included a CAT 6, a Trevo Provue18 microcatheter, and 014 synchro soft wire. This system was advanced through the balloon guide catheter under the road-map function, with adequate back-flush at  the rotating hemostatic valve at that back end of the balloon guide. Blood was then aspirated through the hub of the microcatheter, and a gentle contrast injection was performed confirming intraluminal position. A rotating hemostatic valve  was then attached to the back end of the microcatheter, and a pressurized and heparinized saline bag was attached to the catheter. 4 x 40 solitaire device was then selected. Back flush was achieved at the rotating hemostatic valve, and then the device was gently advanced through the microcatheter to the distal end. The retriever was then unsheathed by withdrawing the microcatheter under fluoroscopy. Once the retriever was completely unsheathed, the microcatheter was carefully stripped from the delivery device. Control angiogram was performed from the intermediate catheter. A 3 minute time interval was observed. The balloon at the balloon guide catheter was then inflated under fluoroscopy for proximal flow arrest. Constant aspiration using the proprietary engine was then performed at the intermediate catheter, as the retriever was gently and slowly withdrawn with fluoroscopic observation. Once the retriever was "corked" within the tip of the intermediate catheter, both were removed from the system. Free aspiration was confirmed at the hub of the balloon guide catheter, with free blood return confirmed. The balloon was then deflated, and a control angiogram was performed. Restoration of flow was confirmed. Angiogram of the cervical ICA was performed. Balloon guide was withdrawn into the carotid artery and repeat angiogram was performed. Balloon guide was then removed. The skin at the puncture site was then cleaned with Chlorhexidine. The 8 French sheath was removed and an 48F angioseal was deployed. Flat panel CT was performed. Patient was extubated once the CT was reviewed. Patient tolerated the procedure well and remained hemodynamically stable throughout. No complications were encountered and no significant blood loss encountered. IMPRESSION: Status post ultrasound guided access right common femoral artery for left-sided cervical/cerebral angiogram and mechanical thrombectomy of ICA terminus occlusion restoring  complete flow (TICI 3) into the ACA territory and MCA territory of the left hemisphere. Angio-Seal deployed for hemostasis. Signed, Dulcy Fanny. Dellia Nims, RPVI [facility] Radiology PLAN: The patient will remain intubated ICU status Target systolic blood pressure of 120-140 Right hip straight time 6 hours Frequent neurovascular checks Repeat neurologic imaging with CT and/MRI at the discretion of neurology team Electronically Signed   By: Corrie Mckusick D.O.   On: 12/27/2019 02:46   CT CEREBRAL PERFUSION W CONTRAST  Result Date: 12/27/2019 CLINICAL DATA:  Initial evaluation for acute stroke, unresponsiveness. EXAM: CT ANGIOGRAPHY HEAD AND NECK CT PERFUSION BRAIN TECHNIQUE: Multidetector CT imaging of the head and neck was performed using the standard protocol during bolus administration of intravenous contrast. Multiplanar CT image reconstructions and MIPs were obtained to evaluate the vascular anatomy. Carotid stenosis measurements (when applicable) are obtained utilizing NASCET criteria, using the distal internal carotid diameter as the denominator. Multiphase CT imaging of the brain was performed following IV bolus contrast injection. Subsequent parametric perfusion maps were calculated using RAPID software. CONTRAST:  110mL OMNIPAQUE IOHEXOL 350 MG/ML SOLN COMPARISON:  Prior head CT from earlier the same day. FINDINGS: CTA NECK FINDINGS Aortic arch: Visualized aortic arch of normal caliber with normal branch pattern. Mild atheromatous change about the arch and origin of the great vessels without stenosis. Visualized subclavian arteries widely patent. Right carotid system: Right common and internal carotid arteries widely patent without stenosis, dissection or occlusion. Left carotid system: Left common carotid artery patent from its origin to the bifurcation without stenosis. Mild atheromatous change about  the left bifurcation without significant stenosis. Left  ICA widely patent distally to the skull base without stenosis, dissection or occlusion. Vertebral arteries: Both vertebral arteries arise from the subclavian arteries. Right vertebral artery dominant, with a diffusely hypoplastic left vertebral artery. Multifocal moderate to severe segmental stenoses seen involving the hypoplastic left vertebral artery, most pronounced proximally. On the right, there is a single short-segment severe stenosis involving the right V1 segment (series 4, image 272). Otherwise, right vertebral artery patent within the neck without stenosis or other abnormality. Skeleton: No acute osseous finding. Focal sclerotic lesion within the T2 vertebral body noted, nonspecific, but likely benign. No other worrisome lytic or blastic osseous lesions. Other neck: No other acute soft tissue abnormality within the neck. No mass lesion or adenopathy. 7 mm left thyroid nodule noted, felt to be of doubtful significance given size and patient age. No follow-up imaging recommended regarding this lesion. Upper chest: Visualized upper chest demonstrates no acute finding. Partially visualized lungs are largely clear. Review of the MIP images confirms the above findings CTA HEAD FINDINGS Anterior circulation: On the right, the petrous, cavernous, and supraclinoid segments are widely patent to the terminus. 5 x 6 x 7 mm saccular aneurysm seen arising from the para clinoid right ICA (series 4, image 116). This is directed medially and posteriorly. On the left, patent but somewhat attenuated flow within the petrous and cavernous left ICA, with subsequent occlusion at the supraclinoid segment/left ICA terminus (series 4, image 115). A1 segments are opacified bilaterally, with filling of the left A1 via collateralization across the circle-of-Willis. A2 segments well perfused to their distal aspects. Opacification of the mid and distal left M1 segment is seen, which could be related to subocclusive thrombus and/or  collateralization. Normal left MCA bifurcation. Attenuated flow seen within the left MCA branches distally, likely via collateralization. Right M1 widely patent. Normal right MCA bifurcation. Distal right MCA branches well perfused. Small vessel atheromatous irregularity noted. Posterior circulation: Vertebral arteries patent to the vertebrobasilar junction without stenosis. Both picas patent. Basilar diminutive but patent to its distal aspect without stenosis. Superior cerebral arteries patent at their origins. Both PCAs primarily supplied via the basilar well perfused to their distal aspects. Venous sinuses: Grossly patent allowing for timing of the contrast bolus. Anatomic variants: None significant. Review of the MIP images confirms the above findings CT Brain Perfusion Findings: ASPECTS: 6 CBF (<30%) Volume: 66mL Perfusion (Tmax>6.0s) volume: 230mL Mismatch Volume: 240mL Infarction Location:Patchy core infarct seen involving the deep and subcortical white matter of the anterior left frontal lobe. Large surrounding ischemic penumbra throughout the majority of the left MCA distribution. IMPRESSION: CTA HEAD AND NECK IMPRESSION: 1. Positive CTA with occlusive thrombus at the left ICA terminus. Distal reconstitution at the mid and distal left M1 segment with attenuated flow distally within the left MCA branches, which could be related to subocclusive thrombus and/or collateralization. 2. Mild atheromatous change about the left carotid bifurcation without stenosis. No other hemodynamically significant stenosis involving the left carotid system. 3. Multifocal moderate to severe stenoses involving the hypoplastic left vertebral artery, most notable proximally. Single short-segment severe proximal right V1 stenosis. Dominant right vertebral artery otherwise widely patent. 4. 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 CT PERFUSION IMPRESSION: 1. 24 cc acute core infarct involving the deep and  subcortical white matter of the anterior left frontal region. Two hundred fourteen cc surrounding ischemic penumbra involving essentially the entirety of the left MCA distribution. 2. Aspects =  6 on prior head CT. Critical Value/emergent results were called by telephone at the time of interpretation on 12/26/2019 at 11:48 pm to provider Dr. Tomi Bamberger, Who verbally acknowledged these results. Critical Value/emergent results were called by telephone at the time of interpretation on 12/26/2019 at 11:54 pm to provider Dr. Cheral Marker, Who verbally acknowledged these results. Electronically Signed   By: Jeannine Boga M.D.   On: 12/27/2019 00:41   DG Chest Port 1 View  Result Date: 12/27/2019 CLINICAL DATA:  Endotracheal tube placement EXAM: PORTABLE CHEST 1 VIEW COMPARISON:  Dec 26, 2019 FINDINGS: The endotracheal tube terminates above the carina by approximately 2.9 cm. The enteric tube extends below the left hemidiaphragm. There is postoperative atelectasis at the lung bases. There is no pneumothorax. The heart size is stable but enlarged. There is no acute osseous abnormality. IMPRESSION: 1. Lines and tubes as above. 2. Postoperative atelectasis at the lung bases. 3. Cardiomegaly. Electronically Signed   By: Constance Holster M.D.   On: 12/27/2019 03:44   DG Chest Port 1 View  Result Date: 12/26/2019 CLINICAL DATA:  Weakness, altered mental status, could stroke EXAM: PORTABLE CHEST 1 VIEW COMPARISON:  CTa 10/31/2019, radiograph 10/31/2019 FINDINGS: Low volumes and central vascular crowding as well as slight prominence of the central pulmonary arteries which may reflect pulmonary artery hypertension. Streaky opacities favor atelectasis in the setting. No focal consolidative process. No pneumothorax or effusion. Cardiac silhouette is enlarged, similar to priors. Remaining cardiomediastinal contours are unremarkable. No acute osseous or soft tissue abnormality. Telemetry leads overlie the chest. IMPRESSION: Low  lung volumes with streaky opacities favoring atelectasis. Electronically Signed   By: Lovena Le M.D.   On: 12/26/2019 23:13   ECHOCARDIOGRAM COMPLETE  Result Date: 12/27/2019    ECHOCARDIOGRAM REPORT   Patient Name:   [facility] A Greener Date of Exam: 12/27/2019 Medical Rec #:  [id number]      Height:       66.0 in Accession #:    [id number]     Weight:       218.0 lb Date of Birth:  1966/06/19     BSA:          2.075 m Patient Age:    53 years       BP:           113/65 mmHg Patient Gender: F              HR:           60 bpm. Exam Location:  Inpatient Procedure: 2D Echo, Cardiac Doppler and Color Doppler Indications:    Stroke  History:        Patient has prior history of Echocardiogram examinations, most                 recent 05/11/2019. CHF, COPD, Arrythmias:Atrial Fibrillation;                 Risk Factors:Hypertension and Diabetes. Nicotine abuse,Substance                 abuse.  Sonographer:    Alvino Chapel RCS Referring Phys: [phone] ERIC LINDZEN  Sonographer Comments: Patient on mechanical ventilator during echo exam IMPRESSIONS  1. Left ventricular ejection fraction, by estimation, is 30 to 35%. The left ventricle has moderately decreased function. The left ventricle demonstrates global hypokinesis. The left ventricular internal cavity size was mildly to moderately dilated. There is moderate asymmetric left ventricular hypertrophy. Left ventricular diastolic function could not be evaluated.  2. Right ventricular systolic function is normal. The right ventricular size is normal.  3. Left atrial size was severely dilated.  4. Right atrial size was severely dilated.  5. The mitral valve is grossly normal. Trivial mitral valve regurgitation. No evidence of mitral stenosis.  6. The aortic valve is grossly normal. Aortic valve regurgitation is not visualized. Moderate aortic valve stenosis. FINDINGS  Left Ventricle: Left ventricular ejection fraction, by estimation, is 30 to 35%. The left ventricle has moderately  decreased function. The left ventricle demonstrates global hypokinesis. The left ventricular internal cavity size was mildly to moderately  dilated. There is moderate asymmetric left ventricular hypertrophy. Left ventricular diastolic function could not be evaluated due to atrial fibrillation. Left ventricular diastolic function could not be evaluated. Right Ventricle: The right ventricular size is normal. No increase in right ventricular wall thickness. Right ventricular systolic function is normal. Left Atrium: Left atrial size was severely dilated. Right Atrium: Right atrial size was severely dilated. Pericardium: There is no evidence of pericardial effusion. Mitral Valve: The mitral valve is grossly normal. Trivial mitral valve regurgitation. No evidence of mitral valve stenosis. Tricuspid Valve: The tricuspid valve is normal in structure. Tricuspid valve regurgitation is trivial. No evidence of tricuspid stenosis. Aortic Valve: The aortic valve is grossly normal. Aortic valve regurgitation is not visualized. Moderate aortic stenosis is present. Aortic valve mean gradient measures 12.0 mmHg. Aortic valve peak gradient measures 19.1 mmHg. Aortic valve area, by VTI measures 1.20 cm. Pulmonic Valve: The pulmonic valve was normal in structure. Pulmonic valve regurgitation is not visualized. No evidence of pulmonic stenosis. Aorta: The aortic root and ascending aorta are structurally normal, with no evidence of dilitation. IAS/Shunts: The atrial septum is grossly normal.  LEFT VENTRICLE PLAX 2D LVIDd:         4.20 cm LVIDs:         3.20 cm LV PW:         1.30 cm LV IVS:        1.50 cm LVOT diam:     2.00 cm LV SV:         57 LV SV Index:   28 LVOT Area:     3.14 cm  LV Volumes (MOD) LV vol d, MOD A2C: 68.2 ml LV vol d, MOD A4C: 100.0 ml LV vol s, MOD A2C: 26.9 ml LV vol s, MOD A4C: 42.7 ml LV SV MOD A2C:     41.3 ml LV SV MOD A4C:     100.0 ml LV SV MOD BP:      50.8 ml RIGHT VENTRICLE RV S prime:     7.18 cm/s  TAPSE (M-mode): 1.9 cm LEFT ATRIUM              Index LA diam:        3.50 cm  1.69 cm/m LA Vol (A2C):   113.0 ml 54.47 ml/m LA Vol (A4C):   133.0 ml 64.11 ml/m LA Biplane Vol: 123.0 ml 59.29 ml/m  AORTIC VALVE AV Area (Vmax):    1.19 cm AV Area (Vmean):   1.27 cm AV Area (VTI):     1.20 cm AV Vmax:           218.50 cm/s AV Vmean:          158.000 cm/s AV VTI:            0.481 m AV Peak Grad:      19.1 mmHg AV Mean Grad:  12.0 mmHg LVOT Vmax:         82.50 cm/s LVOT Vmean:        64.100 cm/s LVOT VTI:          0.183 m LVOT/AV VTI ratio: 0.38  AORTA Ao Root diam: 3.20 cm MITRAL VALVE MV Area (PHT): 3.53 cm     SHUNTS MV Decel Time: 215 msec     Systemic VTI:  0.18 m MV E velocity: 133.00 cm/s  Systemic Diam: 2.00 cm Mertie Moores MD Electronically signed by Mertie Moores MD Signature Date/Time: 12/27/2019/2:34:44 PM    Final    IR PERCUTANEOUS ART THROMBECTOMY/INFUSION INTRACRANIAL INC DIAG ANGIO  Result Date: 12/27/2019 INDICATION: 54 year old female with a history of acute left MCA stroke, ELVO, presenting for mechanical thrombectomy EXAM: ULTRASOUND GUIDED ACCESS RIGHT COMMON FEMORAL ARTERY CERVICAL AND CEREBRAL ANGIOGRAM MECHANICAL THROMBECTOMY LEFT ICA ANGIO-SEAL FOR HEMOSTASIS COMPARISON:  CT IMAGING SAME DAY MEDICATIONS: NONE ANESTHESIA/SEDATION: The anesthesia team was present to provide general endotracheal tube anesthesia and for patient monitoring during the procedure. Intubation was performed in Neuro biplane room. Radial arterial line was performed by the anesthesia team. interventional neuro radiology nursing staff was also present. CONTRAST:  50 cc Omnipaque 300 FLUOROSCOPY TIME:  Fluoroscopy Time: 8 minutes 48 seconds (901 mGy). COMPLICATIONS: None TECHNIQUE: Informed written consent was obtained from the patient's family after a thorough discussion of the procedural risks, benefits and alternatives. Specific risks discussed include: Bleeding, infection, contrast reaction, kidney  injury/failure, need for further procedure/surgery, arterial injury or dissection, embolization to new territory, intracranial hemorrhage (10-15% risk), neurologic deterioration, cardiopulmonary collapse, death. All questions were addressed. Maximal Sterile Barrier Technique was utilized including during the procedure including caps, mask, sterile gowns, sterile gloves, sterile drape, hand hygiene and skin antiseptic. A timeout was performed prior to the initiation of the procedure. The anesthesia team was present to provide general endotracheal tube anesthesia and for patient monitoring during the procedure. Interventional neuro radiology nursing staff was also present. FINDINGS: Initial Findings: Left internal carotid artery: Initial angiogram demonstrates ICA terminus occlusion, without significant plaque of the petrous segment, last seroma segment, cavernous segment or the proximal supraclinoid segment. Ophthalmic artery is patent, with choroid blush. Patent posterior communicating artery with flow into the left PCA. Left MCA: Initial imaging demonstrates no significant primary filling of the MCA. There are leptomeningeal collateral flows partially filling the superior division of the MCA territory. Anterior choroidal artery remains patent with some anterior-posterior flow through the choroidal system into the parietal territory. Left ACA: Initial imaging demonstrates no significant primary filling of the ACA territory. Completion Findings: Left MCA: After 2 passes there is complete restoration of flow through the ICA terminus, incomplete restoration of flow through the right MCA branches. Left ACA: There is restoration of flow into the A1 segment. The right ACA territory is partially served by left-sided ACA branches in the pericallosal distribution. TICI 3 flow restored Flat panel CT demonstrates no subarachnoid hemorrhage or significant contrast staining. PROCEDURE: The anesthesia team was present to provide  general endotracheal tube anesthesia and for patient monitoring during the procedure. Intubation was performed in negative pressure [place] in neuro IR holding. Interventional neuro radiology nursing staff was also present. Ultrasound survey of the right inguinal region was performed with images stored and sent to PACs. 11 blade scalpel was used to make a small incision. Blunt dissection was performed with US guidance. A micropuncture needle was used access the right common femoral artery under ultrasound. With excellent arterial blood flow  returned, an .018 micro wire was passed through the needle, observed to enter the abdominal aorta under fluoroscopy. The needle was removed, and a micropuncture sheath was placed over the wire. The inner dilator and wire were removed, and an 035 wire was advanced under fluoroscopy into the abdominal aorta. The sheath was removed and a 25cm 44F straight vascular sheath was placed. The dilator was removed and the sheath was flushed. Sheath was attached to pressurized and heparinized saline bag for constant forward flow. A coaxial system was then advanced over the 035 wire. This included a 95cm 087 "Walrus" balloon guide with coaxial 125cm Berenstein diagnostic catheter. This was advanced to the proximal descending thoracic aorta. Wire was then removed. Double flush of the catheter was performed. Catheter was then used to select the left common carotid artery. Angiogram was performed. The catheter and balloon guide combination was advanced over a standard glide wire into left cervical ICA, with distal position achieved of the balloon guide. The diagnostic catheter and the wire were removed. Formal angiogram was performed. Road map function was used once the occluded vessel was identified. Copious back flush was performed and the balloon catheter was attached to heparinized and pressurized saline bag for forward flow. A second coaxial system was then advanced through the balloon catheter,  which included the selected intermediate catheter, microcatheter, and microwire. In this scenario, the set up included a 71 zoom catheter, a Trevo Provue18 microcatheter, and 014 synchro soft wire. This system was advanced through the balloon guide catheter under the road-map function, with adequate back-flush at the rotating hemostatic valve at that back end of the balloon guide. Microcatheter and the intermediate catheter system were advanced through the siphon to the level of the occlusion. The microwire was passed through the occlusion into the proximal MCA. Aspiration catheter was then advanced through the ophthalmic segment. Microcatheter and the microwire were removed. The aspiration catheter was attached to the proprietary engine for aspiration confirming adequate backflow. Catheter was then advanced to the site of the occlusion, confirming stasis of backflow. 1 minutes time frame was observed. The catheter was then withdrawn into the ophthalmic segment under fluoroscopy. No significant flow was observed. The balloon on the balloon guide was gently inflated under fluoroscopy. Aspiration catheter was removed from the system while aspiration was performed at the balloon guide hub. The aspiration catheter was completely removed, adequate blood return was confirmed through the balloon guide, and then the balloon on the balloon guide was deflated. Repeat control angiogram was performed confirming continued occlusion at the ICA terminus. We elected to perform a 2nd pass. Coaxial system was again advanced through the balloon catheter, which included the selected intermediate catheter, microcatheter, and microwire. In this scenario, the set up included a CAT 6, a Trevo Provue18 microcatheter, and 014 synchro soft wire. This system was advanced through the balloon guide catheter under the road-map function, with adequate back-flush at the rotating hemostatic valve at that back end of the balloon guide. Blood was then  aspirated through the hub of the microcatheter, and a gentle contrast injection was performed confirming intraluminal position. A rotating hemostatic valve was then attached to the back end of the microcatheter, and a pressurized and heparinized saline bag was attached to the catheter. 4 x 40 solitaire device was then selected. Back flush was achieved at the rotating hemostatic valve, and then the device was gently advanced through the microcatheter to the distal end. The retriever was then unsheathed by withdrawing the microcatheter under fluoroscopy.  Once the retriever was completely unsheathed, the microcatheter was carefully stripped from the delivery device. Control angiogram was performed from the intermediate catheter. A 3 minute time interval was observed. The balloon at the balloon guide catheter was then inflated under fluoroscopy for proximal flow arrest. Constant aspiration using the proprietary engine was then performed at the intermediate catheter, as the retriever was gently and slowly withdrawn with fluoroscopic observation. Once the retriever was "corked" within the tip of the intermediate catheter, both were removed from the system. Free aspiration was confirmed at the hub of the balloon guide catheter, with free blood return confirmed. The balloon was then deflated, and a control angiogram was performed. Restoration of flow was confirmed. Angiogram of the cervical ICA was performed. Balloon guide was withdrawn into the carotid artery and repeat angiogram was performed. Balloon guide was then removed. The skin at the puncture site was then cleaned with Chlorhexidine. The 8 French sheath was removed and an 68F angioseal was deployed. Flat panel CT was performed. Patient was extubated once the CT was reviewed. Patient tolerated the procedure well and remained hemodynamically stable throughout. No complications were encountered and no significant blood loss encountered. IMPRESSION: Status post  ultrasound guided access right common femoral artery for left-sided cervical/cerebral angiogram and mechanical thrombectomy of ICA terminus occlusion restoring complete flow (TICI 3) into the ACA territory and MCA territory of the left hemisphere. Angio-Seal deployed for hemostasis. Signed, Dulcy Fanny. Dellia Nims, RPVI [facility] Radiology PLAN: The patient will remain intubated ICU status Target systolic blood pressure of 120-140 Right hip straight time 6 hours Frequent neurovascular checks Repeat neurologic imaging with CT and/MRI at the discretion of neurology team Electronically Signed   By: Corrie Mckusick D.O.   On: 12/27/2019 02:46   CT HEAD CODE STROKE WO CONTRAST  Result Date: 12/26/2019 CLINICAL DATA:  Code stroke. Initial evaluation for acute unresponsiveness. EXAM: CT HEAD WITHOUT CONTRAST TECHNIQUE: Contiguous axial images were obtained from the base of the skull through the vertex without intravenous contrast. COMPARISON:  CT prior head CT from 08/19/2016. FINDINGS: Brain: Examination somewhat technically limited by motion artifact with streak artifact through the skull base. There is question of asymmetric hypodensity involving the left basal ganglia, with involvement of the left caudate and lentiform nuclei. Additionally, the left insula and adjacent left temporal lobe appear hypodense as well. No associated mass effect. No intracranial hemorrhage. No other evidence for acute large vessel territory infarct. No mass lesion or midline shift. No hydrocephalus or extra-axial fluid collection. Vascular: Question asymmetric hyperdensity at the left ICA terminus (series 2, image 11). No other hyperdense vessel. Skull: Scalp soft tissues within normal limits.  Calvarium intact. Sinuses/Orbits: Globes and orbital soft tissues demonstrate no acute finding. Paranasal sinuses are clear. No mastoid effusion. Other: None. ASPECTS [facility] Stroke Program Early CT  Score) - Ganglionic level infarction (caudate, lentiform nuclei, internal capsule, insula, M1-M3 cortex): 3 - Supraganglionic infarction (M4-M6 cortex): 3 Total score (0-10 with 10 being normal): 6 IMPRESSION: 1. Question evolving hypodensity involving the left basal ganglia, left insula, and adjacent left temporal lobe, which could reflect an evolving left MCA territory infarct. Additionally, there is question of asymmetric hyperdensity at the left ICA terminus, which could reflect intraluminal thrombus/LVO. Finding somewhat difficult to be certain given at there is some motion and streak artifact on this exam. Further assessment with dedicated CTA of the head and neck recommended. 2. ASPECTS is 6. Critical Value/emergent results were called by telephone  at the time of interpretation on 12/26/2019 at 11:13 pm to provider [facility] ZAMMIT , who verbally acknowledged these results. Electronically Signed   By: Jeannine Boga M.D.   On: 12/26/2019 23:18       HISTORY OF PRESENT ILLNESS Chrystina Parga Beever is an 54 y.o. female with a history of DM, HTN, atrial fibrillation on Eliquis, anemia, CHF, COPD and polysubstance abuse, presenting in transfer from [facility] ED for acute left MCA stroke. She initially presented to [facility] acutely after being found unresponsive at home after family heard her fall. EMS was called and on arrival she was altered, with CBG of 379. LKW 2200. Narcan 2 mg was administered by EMS with patient becoming slightly more responsive, but then she declined and was noted to be weak on her right side. Code Stroke was called on arrival to [facility]. CT head showed no hemorrhage, but there was evolving hypodensity in the left MCA territory with ASPECTS of 6. Teleneurology was consulted and she was diagnosed with an acute left MCA stroke with NIHSS of 27.   CTA of head revealed a left ICA terminus occlusion. CTA of neck showed no LVO in the arteries of the neck. CTP  revealed a 24 cc core infarct with 214 cc penumbra involving the left MCA territory. She was emergently transported to [facility] for thrombectomy. she was not a candidate for tPA as she was on Eliquis.   HOSPITAL COURSE Ms. Kainaat Pignatelli Belfield is a 54 y.o. female with history of DM, HTN, atrial fibrillation on Eliquis, anemia, CHF, COPD and polysubstance abuse presenting to [facility] with R hemiparesis, unresponsive. Found to have L MCA infarct transferred to [facility] for thrombectomy .   Stroke:   L MCA infarct s/p thrombectomy w/ TICI3 reperfusion w/ petechial hemorrhage embolic in setting of cocaine use with AF on eliquis and cardiomyopathy  Code Stroke C T head evolving hypodensity L basal ganglia, L insula, L temporal lobe. ? Hyperdensity L ICA terminus. ASPECTS 6    CTA head & neck L ICA terminus occlusion w/ distal reconstitution mid and idstal L M1 w/ attenuated flow distally. Mild L ICA bifurcation atherosclerosis. Short severe proximal R V1 stenosis. 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   CT perfusion 24cc core infarct deep and subcortical white matter anterior L frontal lobe. 214cc penumbra.  Cerebral angio L ICA occlusion w/ no flow in ACA or MCA. TICI3 revascularization ACA and MCA  MRI large patchy left MCA infarct with hemorrhagic petechial transformation.  Repeat CT 5/25 L MCA infarct w/ petechial hemorrhage, now w/ 38mm midline shift   Repeat CT 5/26 unchanged hemorrhage and shift  Repeat CT 5/28 - unchanged L MCA infarct w/ petechial hemorrhage, unchanged 65mm midline shift   2D Echo EF 30 to 35%.  No definite clot.  LDL 68  HgbA1c 11.6  UDS positive for cocaine and THC  Heparin 5000 units sq tid for VTE prophylaxis  Eliquis (apixaban) daily prior to admission, now on Eliquis (apixaban) daily. Continue at d/c.   Therapy recommendations:  CIR   Disposition:  CIR (lived with boyfriend PTA)  Acute Respiratory  Failure, resolved  Intubated for IR, left intubated for airway protection, now extubated  Extubated 5/27   CCM signed off   Cerebral Edema, improving  66mm midline shift seen on CT 5/25 am  Started 3% hypertonic saline protocol -> off  Started on free water 200  q 6h  Do not resume 3%  Allowed Na to drift down  Na 142  Atrial Fibrillation w/ RVR  Home anticoagulation:  Eliquis (apixaban) daily - last documented fill time was end of March 2021 - ? compliance but unable to verify  On Coreg 50 bid, digoxin 0.125 daily PTA - resumed in hospital  Treated with Heparin IV in hospital, now on Eliquis  Cardiomyopathy, ischemic  2D Echo EF 30 to 35%.  No definite clot.  On home Demadex ; Coreg ; Bidil ; digoxin  On eliquis  Hypertension  Home meds:  Hydralazine 50 tid  BP goal < 180  On coreg 50 bid, torsemide 20, isosorbide-hydralazine 20-37.5 tid, prn labetalol  Long-term BP goal normotensive  Diabetes type II Uncontrolled  Home meds:  trulicity [id number] weekly, 0000000 30u bid, metformin 1000  HgbA1c 11.6, goal < 7.0  CBG monitoring  SSI   resume metformin  On launtus 60  On 2u TF coverage q4h  Close PCP follow up as outpt  Dysphagia  Secondary to stroke  Treated with TF via Cortrak  cleared for D1 pudding thick liquid diet  Speech on board  cortrak still in place  Cocaine abuse  UDS positive for cocaine  Cessation education will be provided.   Other Stroke Risk Factors  Former Cigarette smoker  ETOH use, alcohol level <10, will advise to drink no more than 1 drink(s) a day. Family aware  Polysubstance abuse, hx cocaine and marijuana   Obesity, Body mass index is 34.87 kg/m., recommend weight loss, diet and exercise as appropriate   Other Active Problems  COPD  GERD  AKI - 1.07->1.29 - 1.33->1.11->1.20   5x6x7 saccular paraclinoid R ICA aneurysm - Dr Norma Fredrickson aware and she will follow as outpt  Hypokalemia -  resolved  DISCHARGE EXAM Blood pressure 138/74, pulse 77, temperature 98.6 F (37 C), temperature source Oral, resp. rate 18, height 5\' 6"  (1.676 m), weight 98 kg, last menstrual period 08/21/2011, SpO2 97 %. General - Well nourished, well developed, in no apparent distress.  Ophthalmologic - fundi not visualized due to noncooperation.  Cardiovascular - Regular rhythm and rate.  Neuro - awake alert, eyes open, however, global aphasia, only able to close eyes as requested. Not following other commands, able to make sounds but not make sense. Not able to name or repeat. Eyes left gaze preference, able to cross midline in response to sound but not able to move spontaneously. PERRL, right hemianopia, not blinking to visual threat on the right. Right facial droop. RUE proximal 0/5 bicep 2/5 and finger grip 2+/5. RLE slight withdraw to pain. LUE and LLE spontaneous movement against gravity. Sensation, coordination and gait not tested.  Discharge Diet  Dysphagia I pudding thick liquid diet  DISCHARGE PLAN  Disposition:  Transfer to [facility] for ongoing PT, OT and ST  Eliquis (apixaban) daily for secondary stroke prevention   Recommend ongoing stroke risk factor control by Primary Care Physician at time of discharge from inpatient rehabilitation.  Follow-up PCP Inda Coke, PA in 2 weeks following discharge from rehab.  Follow-up in [facility] in 4 weeks following discharge from rehab, office to schedule an appointment.   Follow-up Pedro Earls MD (Interventional Neuroradiologist) as an outpatient to evaluate R ICA paraclinoid saccular aneurysm  35 minutes were spent preparing discharge.  Rosalin Hawking, MD PhD Stroke Neurology 01/05/2020 5:35 PM

## 2020-01-04 NOTE — Progress Notes (Signed)
Physical Therapy Treatment Patient Details Name: Robin [name] MRN: [id number] DOB: 26-Aug-1965 Today's Date: 01/04/2020    History of Present Illness 54 y.o. female with a history of DM, HTN, atrial fibrillation on Eliquis, anemia, CHF, COPD and polysubstance abuse, presenting initially to [facility] ED after being found unresponsive at home after family heard her fall. Pt transferred/admitted to [facility] for acute left MCA stroke and L ICA occlusion, is now s/p mechanical thrombectomy on 5/24, pt kept intubated post procedure.     PT Comments    Patient is making progress toward PT goals and more alert throughout session today. Pt able to achieve upright standing X 3 trials with +2 assist and Stedy standing frame. Continue to recommend CIR for further skilled PT services to maximize independence and safety with mobility.    Follow Up Recommendations  CIR;Supervision/Assistance - 24 hour     Equipment Recommendations  Hospital bed;Wheelchair (measurements PT);Wheelchair cushion (measurements PT);3in1 (PT)    Recommendations for Other Services Rehab consult     Precautions / Restrictions Precautions Precautions: Fall Precaution Comments: R hemi Restrictions Weight Bearing Restrictions: No    Mobility  Bed Mobility Overal bed mobility: Needs Assistance Bed Mobility: Supine to Sit     Supine to sit: HOB elevated;Mod assist     General bed mobility comments: multimodal cues for sequencing; assist to bring R LE and hips to EOB with bed pad and then to elevate trunk into sitting; pt used rail   Transfers Overall transfer level: Needs assistance   Transfers: Sit to/from Stand Sit to Stand: +2 physical assistance;Mod assist;Max assist         General transfer comment: pt stood X 3 trials; R UE supported by therapist; bed pad utilized to facilitate hip extension; pt able to achieve upright posture X2 trials this session   Ambulation/Gait                 Stairs              Wheelchair Mobility    Modified Rankin (Stroke Patients Only)       Balance Overall balance assessment: Needs assistance Sitting-balance support: Feet supported;Single extremity supported Sitting balance-Leahy Scale: Poor   Postural control: Posterior lean;Right lateral lean Standing balance support: Bilateral upper extremity supported Standing balance-Leahy Scale: Poor                              Cognition Arousal/Alertness: Awake/alert Behavior During Therapy: Flat affect Overall Cognitive Status: Difficult to assess                                 General Comments: pt closing eyes often throughout session and with varying levels of participation      Exercises      General Comments        Pertinent Vitals/Pain Pain Assessment: Faces Faces Pain Scale: No hurt    Home Living                      Prior Function            PT Goals (current goals can now be found in the care plan section) Progress towards PT goals: Progressing toward goals    Frequency    Min 4X/week      PT Plan Current plan remains appropriate    Co-evaluation  AM-PAC PT "6 Clicks" Mobility   Outcome Measure  Help needed turning from your back to your side while in a flat bed without using bedrails?: A Lot Help needed moving from lying on your back to sitting on the side of a flat bed without using bedrails?: A Lot Help needed moving to and from a bed to a chair (including a wheelchair)?: Total Help needed standing up from a chair using your arms (e.g., wheelchair or bedside chair)?: A Lot Help needed to walk in hospital room?: Total Help needed climbing 3-5 steps with a railing? : Total 6 Click Score: 9    End of Session Equipment Utilized During Treatment: Gait belt Activity Tolerance: Patient tolerated treatment well Patient left: with call bell/phone within reach;with family/visitor present;in chair;with  chair alarm set Nurse Communication: Mobility status;Need for lift equipment PT Visit Diagnosis: Other abnormalities of gait and mobility (R26.89);Hemiplegia and hemiparesis;Other symptoms and signs involving the nervous system (R29.898) Hemiplegia - Right/Left: Right Hemiplegia - dominant/non-dominant: Dominant Hemiplegia - caused by: Cerebral infarction     Time: [id number] PT Time Calculation (min) (ACUTE ONLY): 34 min  Charges:  $Gait Training: 8-22 mins $Therapeutic Activity: 8-22 mins                     Earney Navy, PTA Acute Rehabilitation Services Pager: [phone] Office: [phone]     Darliss Cheney 01/04/2020, 4:31 PM

## 2020-01-04 NOTE — Progress Notes (Signed)
STROKE TEAM PROGRESS NOTE   INTERVAL HISTORY Pt lying in bed, no family at bedside, has cortrak on TF. Able to close eyes on command, but not following other commands, still has expressive aphasia. Right hemiplegia with leg worse than arm.    Vitals:   01/04/20 0407 01/04/20 0453 01/04/20 0800 01/04/20 1232  BP: 115/83  133/78 103/73  Pulse: 84  92 89  Resp: 19  17 18   Temp: 97.8 F (36.6 C)  (!) 97.4 F (36.3 C) 98 F (36.7 C)  TempSrc: Oral  Oral Oral  SpO2: 97%  100% 98%  Weight:  98 kg    Height:       CBC:  Recent Labs  Lab 01/03/20 0707 01/04/20 0648  WBC 10.1 9.5  HGB 12.4 12.9  HCT 40.3 41.6  MCV 95.0 94.8  PLT 231 [id number]   Basic Metabolic Panel:  Recent Labs  Lab 12/28/19 1855 12/29/19 0120 01/02/20 0442 01/04/20 0648  NA 148*   < > 152* 143  K  --    < > 3.4* 4.1  CL  --    < > 113* 111  CO2  --    < > 29 23  GLUCOSE  --    < > 118* 139*  BUN  --    < > 39* 24*  CREATININE  --    < > 1.33* 1.11*  CALCIUM  --    < > 9.3 8.8*  MG 2.1  --   --   --   PHOS 2.5  --   --   --    < > = values in this interval not displayed.   Lipid Panel:     Component Value Date/Time   CHOL 121 12/27/2019 0305   TRIG 128 12/27/2019 0305   HDL 27 (L) 12/27/2019 0305   CHOLHDL 4.5 12/27/2019 0305   VLDL 26 12/27/2019 0305   LDLCALC 68 12/27/2019 0305   HgbA1c:  Lab Results  Component Value Date   HGBA1C 11.6 (H) 12/27/2019   Urine Drug Screen:     Component Value Date/Time   LABOPIA NONE DETECTED 12/26/2019 2249   COCAINSCRNUR POSITIVE (A) 12/26/2019 2249   LABBENZ NONE DETECTED 12/26/2019 2249   AMPHETMU NONE DETECTED 12/26/2019 2249   THCU NONE DETECTED 12/26/2019 2249   LABBARB NONE DETECTED 12/26/2019 2249    Alcohol Level     Component Value Date/Time   ETH <10 12/26/2019 2255    IMAGING past 24 hours No results found.  PHYSICAL EXAM     Temp:  [97.4 F (36.3 C)-98.2 F (36.8 C)] 98 F (36.7 C) (06/01 1232) Pulse Rate:  [84-92] 89 (06/01  1232) Resp:  [17-19] 18 (06/01 1232) BP: (92-133)/(54-83) 103/73 (06/01 1232) SpO2:  [97 %-100 %] 98 % (06/01 1232) Weight:  [98 kg] 98 kg (06/01 0453)  General - Well nourished, well developed, in no apparent distress.  Ophthalmologic - fundi not visualized due to noncooperation.  Cardiovascular - Regular rhythm and rate.  Neuro - awake alert, eyes open, however, global aphasia, only able to close eyes as requested. Not following other commands, able to make sounds but not make sense. Not able to name or repeat. Eyes left gaze preference, able to cross midline in response to sound but not able to move spontaneously. PERRL, right hemianopia, not blinking to visual threat on the right. Right facial droop. RUE proximal 0/5 bicep 2/5 and finger grip 2+/5. RLE slight withdraw to pain. LUE and  LLE spontaneous movement against gravity. Sensation, coordination and gait not tested.    ASSESSMENT/PLAN Ms. Adylyn Heiting Repka is a 54 y.o. female with history of DM, HTN, atrial fibrillation on Eliquis, anemia, CHF, COPD and polysubstance abuse presenting to [facility] with R hemiparesis, unresponsive. Found to have L MCA infarct transferred to [facility]. [facility] for thrombectomy .   Stroke:   L MCA infarct w/ petechial hemorrhage embolic in setting of cocaine use with AF on eliquis and cardiomyopathy  Code Stroke C T head evolving hypodensity L basal ganglia, L insula, L temporal lobe. ? Hyperdensity L ICA terminus. ASPECTS 6    CTA head & neck L ICA terminus occlusion w/ distal reconstitution mid and idstal L M1 w/ attenuated flow distally. Mild L ICA bifurcation atherosclerosis. Short severe proximal R V1 stenosis. 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   CT perfusion 24cc core infarct deep and subcortical white matter anterior L frontal lobe. 214cc penumbra.  Cerebral angio L ICA occlusion w/ no flow in ACA or MCA. TICI3 revascularization ACA and MCA  MRI large patchy left MCA  infarct with hemorrhagic petechial transformation.  Repeat CT 5/25 L MCA infarct w/ petechial hemorrhage, now w/ 47mm midline shift   Repeat CT 5/26 unchanged hemorrhage and shift  Repeat CT 5/28 - unchanged L MCA infarct w/ petechial hemorrhage, unchanged 46mm midline shift   2D Echo EF 30 to 35%.  No definite clot.  LDL 68  HgbA1c 11.6  UDS positive for cocaine and THC  Heparin 5000 units sq tid for VTE prophylaxis  Eliquis (apixaban) daily prior to admission, now on Eliquis (apixaban) daily. Continue at d/c.   Therapy recommendations:  CIR - family is agreeable - anticipate insurance approval for 6/2  Disposition:  pending (lived with boyfriend PTA)  Acute Respiratory Failure, resolved  Intubated for IR, left intubated for airway protection, now extubated  CXR streaky opacities favoring atx yest, today w/ post op atx  Extubated 5/27   CCM signed off   Cerebral Edema  30mm midline shift seen on CT 5/25 am  Started 3% hypertonic saline protocol -> off  Started on free water 200 q 6h  Allow Na to drift down  Do not resume 3%  Na 143  Atrial Fibrillation w/ RVR  Home anticoagulation:  Eliquis (apixaban) daily - last documented fill time was end of March 2021  On Coreg 50 bid, digoxin 0.125 daily PTA - resumed in hospital  Treated with Heparin IV in hospital, now on Eliquis  Cardiomyopathy  ischemic  2D Echo EF 30 to 35%.  No definite clot.  On home Demadex ; Coreg ; Bidil ; digoxin  On eliquis  Hypertension  Home meds:  Hydralazine 50 tid . BP goal < 180 . On coreg 50 bid, torsemide 20, isosorbide-hydralazine 20-37.5 tid, prn labetalol . Long-term BP goal normotensive  Diabetes type II Uncontrolled  Home meds:  trulicity A999333 weekly, 0000000 30u bid, metformin 1000  HgbA1c 11.6, goal < 7.0  CBG monitoring  SSI   resume metformin  On launtus 60  On 2u TF coverage q4h  Close PCP follow up as outpt  Dysphagia . Secondary to  stroke . Treated with TF via Cortrak . cleared for D1 pudding thick liquid diet . Speech on board . cortrak still in place  Cocaine abuse  UDS positive for cocaine  Cessation education will be provided.   Other Stroke Risk Factors  Former Cigarette smoker  ETOH use,  alcohol level <10, will advise to drink no more than 1 drink(s) a day. Family aware  Polysubstance abuse, hx cocaine and marijuana   Obesity, Body mass index is 34.87 kg/m., recommend weight loss, diet and exercise as appropriate   Other Active Problems  COPD  GERD  AKI - 1.07->1.29 - 1.33->1.11   5x6x7 saccular parlinoid R ICA aneurysm - Dr Norma Fredrickson aware and she will follow as outp  Hypokalemia - 3.2-> 3.4 -> 4.1  Hospital day # 8   Rosalin Hawking, MD PhD Stroke Neurology 01/05/2020 12:57 AM     To contact Stroke Continuity provider, please refer to [email or web address] After hours, contact General Neurology

## 2020-01-04 NOTE — H&P (Signed)
Physical Medicine and Rehabilitation Admission H&P    Chief Complaint  Patient presents with  . Unresponsive  : HPI: Robin [name] is a 54 year old right-handed female history of diastolic congestive heart failure, hypertension, atrial fibrillation on Eliquis, COPD, polysubstance abuse, diabetes mellitus and tobacco abuse.  He presented on 12/27/2019 with right hemiparesis, AMS, and aphasia to [facility].  Cranial CT scan showed question evolving hypodensity involving the left basal ganglia left insula and adjacent left temporal lobe.  CT perfusion scan as well as CTA of head and neck showed a 24 cc acute infarct deep and subcortical white matter of the anterior left frontal region.  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  Patient did not receive TPA.  Patient was transferred to [facility].  MRI showed evolving left MCA infarction with hemorrhagic transformation.  Mass-effect with 6 mm rightward midline shift.  Admission chemistries urine drug screen positive cocaine, glucose 355, creatinine 1.15, troponin negative, BNP 367, alcohol negative.  Echocardiogram with ejection fraction of 35% with global hypokinesis.  Patient underwent mechanical thrombectomy per interventional radiology required intubation and was extubated 12/28/2019.  Initially maintained on low-dose aspirin and transitioned back to Eliquis.  Hospital course further complicated by dysphagia.  Started on dysphagia #1 pudding thick liquid diet with nasogastric tube for nutritional support.  Therapy evaluations completed and patient was admitted for a comprehensive rehab program.  Please see preadmission assessment from earlier today as well.  Review of Systems  Unable to perform ROS: Mental acuity   Past Medical History:  Diagnosis Date  . Allergy   . Anemia   . Arthritis   . CHF (congestive heart failure) ([place])    a. EF at 30-35% by echo in 08/2018 b. EF at 35% by repeat echo in 05/2019  .  Chronic abdominal pain   . Cocaine abuse ([place])   . COPD (chronic obstructive pulmonary disease) ([place])   . Essential hypertension, benign   . Fatty liver   . GERD (gastroesophageal reflux disease)   . Gout 2016  . Normal coronary arteries    3/10 - following abnormal Myoview  . Ovarian cyst   . Type 2 diabetes mellitus ([place])    Past Surgical History:  Procedure Laterality Date  . ABDOMINAL HYSTERECTOMY  09/10/2011   Procedure: HYSTERECTOMY ABDOMINAL;  Surgeon: Jonnie Kind, MD;  Location: [facility];  Service: Gynecology;  Laterality: N/A;  Abdominal hysterectomy  . CESAREAN SECTION  [id number], and 1994  . CHOLECYSTECTOMY  1995  . IR CT HEAD LTD  12/27/2019  . IR PERCUTANEOUS ART THROMBECTOMY/INFUSION INTRACRANIAL INC DIAG ANGIO  12/27/2019  . IR US GUIDE VASC ACCESS RIGHT  12/27/2019  . RADIOLOGY WITH ANESTHESIA N/A 12/27/2019   Procedure: IR WITH ANESTHESIA;  Surgeon: Luanne Bras, MD;  Location: [facility];  Service: Radiology;  Laterality: N/A;  . SCAR REVISION  09/10/2011   Procedure: SCAR REVISION;  Surgeon: Jonnie Kind, MD;  Location: [facility];  Service: Gynecology;  Laterality: N/A;  Wide Excision of old Cicatrix  . TUBAL LIGATION  1994   Family History  Problem Relation Age of Onset  . Cirrhosis Mother   . Early death Mother   . Alcohol abuse Mother   . Diabetes type II Father   . Alcohol abuse Father   . Diabetes type II Sister   . Early death Brother   . Alcohol abuse Son   . Anesthesia problems Neg Hx   .  Hypotension Neg Hx   . Malignant hyperthermia Neg Hx   . Pseudochol deficiency Neg Hx   . Colon cancer Neg Hx   . Colon polyps Neg Hx   . Esophageal cancer Neg Hx   . Rectal cancer Neg Hx   . Stomach cancer Neg Hx    Social History:  reports that she has quit smoking. Her smoking use included cigarettes. She has a 7.25 pack-year smoking history. She has never used smokeless tobacco. She reports current alcohol use. She reports previous drug use. Drugs: Cocaine and  Marijuana. Allergies:  Allergies  Allergen Reactions  . Bee Venom Shortness Of Breath and Swelling    Bodily Swelling  . Losartan Other (See Comments)    Nosebleeds per patient report.   . Naproxen Other (See Comments)    Effects acid reflux  . Penicillins Nausea Only    Has patient had a PCN reaction causing immediate rash, facial/tongue/throat swelling, SOB or lightheadedness with hypotension: no Has patient had a PCN reaction causing severe rash involving mucus membranes or skin necrosis: no Has patient had a PCN reaction that required hospitalization no Has patient had a PCN reaction occurring within the last 10 years: no If all of the above answers are "NO", then may proceed with Cephalosporin   . Lisinopril     Sinus congestion   Medications Prior to Admission  Medication Sig Dispense Refill  . acetaminophen (TYLENOL) 325 MG tablet Take 2 tablets (650 mg total) by mouth every 6 (six) hours as needed for mild pain, fever or headache (or Fever >/= 101). 12 tablet 0  . albuterol (PROVENTIL) (2.5 MG/3ML) 0.083% nebulizer solution Take 3 mLs (2.5 mg total) by nebulization every 6 (six) hours as needed for wheezing or shortness of breath. 75 mL 5  . albuterol (VENTOLIN HFA) 108 (90 Base) MCG/ACT inhaler Inhale 2 puffs into the lungs every 6 (six) hours as needed for wheezing or shortness of breath. 18 g 5  . apixaban (ELIQUIS) 5 MG TABS tablet Take 1 tablet (5 mg total) by mouth 2 (two) times daily. 60 tablet 5  . carvedilol (COREG) 25 MG tablet Take 2 tablets (50 mg total) by mouth 2 (two) times daily with a meal. 120 tablet 6  . cephALEXin (KEFLEX) 500 MG capsule Take 1 capsule (500 mg total) by mouth 3 (three) times daily. 15 capsule 0  . digoxin (LANOXIN) 0.125 MG tablet Take 1 tablet (0.125 mg total) by mouth daily. 30 tablet 5  . Dulaglutide (TRULICITY) A999333 0000000 SOPN Inject 0.75 mg into the skin once a week. 4 pen 3  . gabapentin (NEURONTIN) 100 MG capsule Take 1 capsule (100  mg total) by mouth 3 (three) times daily. 90 capsule 5  . glucose blood (CONTOUR NEXT TEST) test strip 2x daily 100 each 12  . hydrALAZINE (APRESOLINE) 50 MG tablet Take 1 tablet (50 mg total) by mouth 3 (three) times daily. 90 tablet 5  . insulin aspart protamine - aspart (NOVOLOG MIX 70/30 FLEXPEN) (70-30) 100 UNIT/ML FlexPen Inject 0.3 mLs (30 Units total) into the skin 2 (two) times daily. (Patient taking differently: Inject into the skin 2 (two) times daily. 20 units in am and 30 in the pm) 30 mL 6  . Insulin Pen Needle 32G X 4 MM MISC 1 Device by Does not apply route as directed. 100 each 6  . metFORMIN (GLUCOPHAGE) 1000 MG tablet Take 1 tablet (1,000 mg total) by mouth daily with breakfast. 90 tablet 3  .  pantoprazole (PROTONIX) 40 MG tablet Take 1 tablet (40 mg total) by mouth daily. 30 tablet 2  . potassium chloride SA (KLOR-CON) 20 MEQ tablet Take 1 tablet (20 mEq total) by mouth 2 (two) times daily. 60 tablet 3  . tizanidine (ZANAFLEX) 2 MG capsule Take 1 capsule (2 mg total) by mouth 2 (two) times daily as needed for muscle spasms. 15 capsule 0  . torsemide (DEMADEX) 20 MG tablet Take 1 tablet (20 mg total) by mouth 2 (two) times daily. 60 tablet 5  . triamcinolone cream (KENALOG) 0.1 % Apply to affected area one to times daily 45 g 0    Drug Regimen Review Drug regimen was reviewed and remains appropriate with no significant issues identified  Home: Home Living Family/patient expects to be discharged to:: Private residence Living Arrangements: Spouse/significant other(will likely go to son or daughter's home) Available Help at Discharge: Family, Available 24 hours/day Type of Home: House Home Access: Stairs to enter [employer] of Steps: 3-4 Entrance Stairs-Rails: Right Home Layout: One level Bathroom Shower/Tub: Chiropodist: Standard Home Equipment: None Additional Comments: per chart and RN report, sister has been present and supportive. 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 use of bed pad to scoot hips to EOB Transfers Overall transfer level: Needs assistance Equipment used: Ambulation equipment used Transfer via Lift Equipment: Stedy Transfers: Sit to/from Stand Sit to Stand: +2 physical assistance, Mod assist, Max assist Squat pivot transfers: Max assist, +2 physical assistance  Lateral/Scoot Transfers: Max assist, +2 physical assistance, +2 safety/equipment General transfer comment: pt initially required MOD A for sit<>stand from EOB, however pt required MAX A from low BSC to power into standing. Pt with R anterior lean during session needing verbal and tactile cues to stand upright Ambulation/Gait Ambulation/Gait assistance: Max assist, +2 physical assistance Gait Distance (Feet): 1 Feet Assistive device: 2 Case hand held assist(holding back of recliner) Gait Pattern/deviations: Step-to pattern, Shuffle, Wide base of support General Gait Details: scooting L foot up toward [facility] and assist for weight shift and to move R foot over x 2-3 "steps"    ADL: ADL Overall ADL's : Needs assistance/impaired Grooming: Wash/dry face, Sitting Grooming Details (indicate cue type and reason): with LUE sitting on stedy at sink Toilet Transfer: Maximal assistance, +2 for physical assistance, +2 for  safety/equipment, Cueing for safety, Moderate assistance Toilet Transfer Details (indicate cue type and reason): +2 mod- max assist for sit<>stand with stedy to [place]- Clothing Manipulation and Hygiene: Total assistance, +2 for physical assistance, Sit to/from stand, Bed level Toileting - Clothing Manipulation Details (indicate cue type and reason): total A +2 to attempt pericare in standing however pt unable to tolerate extensive standing d/t R anterior lean; would likely needing total A from bed level Functional mobility during ADLs: Maximal assistance, +2 for physical assistance, +2 for safety/equipment, Cueing for safety, Cueing for sequencing, Moderate assistance General  ADL Comments: pt continues to present with decreased activity tolerance, R sided weakness and impaired functional mobility impacting pts ability to engage in BADLs.  Cognition: Cognition Overall Cognitive Status: Difficult to assess Arousal/Alertness: Awake/alert Orientation Level: Oriented to [place] Attention: Sustained Sustained Attention: Appears intact Awareness: Impaired Awareness Impairment: Emergent impairment Problem Solving: Appears intact(functional, basic) Cognition Arousal/Alertness: Awake/alert Behavior During Therapy: Flat affect Overall Cognitive Status: Difficult to assess General Comments: pt continues to be flat during sessions presenting with eyes closed often, however pt gesturing appropriately to bathroom when needing to void bowels Difficult to assess due to: Impaired communication  Physical Exam: Blood pressure 106/75, pulse 99, temperature 98.6 F (37 C), temperature source Oral, resp. rate 19, height 5\' 6"  (1.676 m), weight 98 kg, last menstrual period 08/21/2011, SpO2 100 %. Physical Exam  Vitals reviewed. Constitutional: She appears well-developed.  Obese  HENT:  Head: Normocephalic and atraumatic.  Nasogastric tube in place  Eyes: Right eye exhibits no discharge. Left eye  exhibits no discharge.  Only briefly opens eyes  Neck: No tracheal deviation present. No thyromegaly present.  Respiratory: Effort normal. No stridor. No respiratory distress.  GI: Soft. She exhibits no distension.  Musculoskeletal:     Comments: No edema or tenderness in extremities  Neurological: She is alert.  Lethargic No acute distress. Makes eye contact with examiner.   Global aphasia Motor: Limited to follow commands, moving left side Spontaneously, no movement noted on right side. Right lean  Skin: Skin is warm and dry.  Psychiatric: Her speech is delayed and slurred. She is slowed. Cognition and memory are impaired.    Results for orders placed or performed during the hospital encounter of 12/26/19 (from the past 48 hour(s))  Glucose, capillary     Status: Abnormal   Collection Time: 01/03/20  5:43 PM  Result Value Ref Range   Glucose-Capillary 177 (H) 70 - 99 mg/dL    Comment: Glucose reference range applies only to samples taken after fasting for at least 8 hours.  Glucose, capillary     Status: Abnormal   Collection Time: 01/03/20  7:58 PM  Result Value Ref Range   Glucose-Capillary 296 (H) 70 - 99 mg/dL    Comment: Glucose reference range applies only to samples taken after fasting for at least 8 hours.  Glucose, capillary     Status: Abnormal   Collection Time: 01/03/20 11:55 PM  Result Value Ref Range   Glucose-Capillary 185 (H) 70 - 99 mg/dL    Comment: Glucose reference range applies only to samples taken after fasting for at least 8 hours.  Glucose, capillary     Status: Abnormal   Collection Time: 01/04/20  4:03 AM  Result Value Ref Range   Glucose-Capillary 110 (H) 70 - 99 mg/dL    Comment: Glucose reference range applies only to samples taken after fasting for at least 8 hours.  CBC     Status: None   Collection Time: 01/04/20  6:48 AM  Result Value Ref Range   WBC 9.5 4.0 - 10.5 K/uL   RBC 4.39 3.87 - 5.11 MIL/uL   Hemoglobin 12.9 12.0 - 15.0 g/dL    HCT 41.6 36.0 - 46.0 %   MCV 94.8 80.0 - 100.0 fL   MCH 29.4 26.0 - 34.0 pg   MCHC 31.0 30.0 - 36.0 g/dL   RDW 13.7 11.5 - 15.5 %   Platelets 223 150 - 400 K/uL   nRBC 0.0 0.0 - 0.2 %    Comment:  Performed at [facility], [place] [address], [place], [place] [address]  Comprehensive metabolic panel     Status: Abnormal   Collection Time: 01/04/20  6:48 AM  Result Value Ref Range   Sodium 143 135 - 145 mmol/L   Potassium 4.1 3.5 - 5.1 mmol/L   Chloride 111 98 - 111 mmol/L   CO2 23 22 - 32 mmol/L   Glucose, Bld 139 (H) 70 - 99 mg/dL    Comment: Glucose reference range applies only to samples taken after fasting for at least 8 hours.   BUN 24 (H) 6 - 20 mg/dL   Creatinine, Ser 1.11 (H) 0.44 - 1.00 mg/dL   Calcium 8.8 (L) 8.9 - 10.3 mg/dL   Total Protein 6.4 (L) 6.5 - 8.1 g/dL   Albumin 2.9 (L) 3.5 - 5.0 g/dL   AST 46 (H) 15 - 41 U/L   ALT 33 0 - 44 U/L   Alkaline Phosphatase 65 38 - 126 U/L   Total Bilirubin 0.8 0.3 - 1.2 mg/dL   GFR calc non Af Amer 57 (L) >60 mL/min   GFR calc Af Amer >60 >60 mL/min   Anion gap 9 5 - 15    Comment: Performed at [facility] [address], [place], [place] [address]  Glucose, capillary     Status: Abnormal   Collection Time: 01/04/20  8:04 AM  Result Value Ref Range   Glucose-Capillary 174 (H) 70 - 99 mg/dL    Comment: Glucose reference range applies only to samples taken after fasting for at least 8 hours.   Comment 1 Notify RN    Comment 2 Document in Chart   Glucose, capillary     Status: Abnormal   Collection Time: 01/04/20 11:56 AM  Result Value Ref Range   Glucose-Capillary 227 (H) 70 - 99 mg/dL    Comment: Glucose reference range applies only to samples taken after fasting for at least 8 hours.   Comment 1 Notify RN    Comment 2 Document in Chart   Basic metabolic panel     Status: Abnormal   Collection Time: 01/04/20  2:18 PM  Result Value Ref Range   Sodium 142 135 - 145 mmol/L   Potassium 4.9 3.5 - 5.1 mmol/L    Chloride 108 98 - 111 mmol/L   CO2 23 22 - 32 mmol/L   Glucose, Bld 220 (H) 70 - 99 mg/dL    Comment: Glucose reference range applies only to samples taken after fasting for at least 8 hours.   BUN 28 (H) 6 - 20 mg/dL   Creatinine, Ser 1.20 (H) 0.44 - 1.00 mg/dL   Calcium 9.2 8.9 - 10.3 mg/dL   GFR calc non Af Amer 52 (L) >60 mL/min   GFR calc Af Amer 60 (L) >60 mL/min   Anion gap 11 5 - 15    Comment: Performed at [facility] [address] [place], [place] [address]  Glucose, capillary     Status: Abnormal   Collection Time: 01/04/20  3:48 PM  Result Value Ref Range   Glucose-Capillary 223 (H) 70 - 99 mg/dL    Comment: Glucose reference range applies only to samples taken after fasting for at least 8 hours.   Comment 1 Notify RN    Comment 2 Document in Chart   Glucose, capillary     Status: Abnormal   Collection Time: 01/04/20  8:03 PM  Result Value Ref Range  Glucose-Capillary 273 (H) 70 - 99 mg/dL    Comment: Glucose reference range applies only to samples taken after fasting for at least 8 hours.   Comment 1 Notify RN    Comment 2 Document in Chart   Glucose, capillary     Status: Abnormal   Collection Time: 01/04/20 11:45 PM  Result Value Ref Range   Glucose-Capillary 232 (H) 70 - 99 mg/dL    Comment: Glucose reference range applies only to samples taken after fasting for at least 8 hours.   Comment 1 Notify RN    Comment 2 Document in Chart   Glucose, capillary     Status: Abnormal   Collection Time: 01/05/20  3:39 AM  Result Value Ref Range   Glucose-Capillary 173 (H) 70 - 99 mg/dL    Comment: Glucose reference range applies only to samples taken after fasting for at least 8 hours.   Comment 1 Notify RN    Comment 2 Document in Chart   CBC     Status: None   Collection Time: 01/05/20  6:03 AM  Result Value Ref Range   WBC 9.2 4.0 - 10.5 K/uL   RBC 4.51 3.87 - 5.11 MIL/uL   Hemoglobin 13.2 12.0 - 15.0 g/dL   HCT 42.2 36.0 - 46.0 %   MCV 93.6 80.0 -  100.0 fL   MCH 29.3 26.0 - 34.0 pg   MCHC 31.3 30.0 - 36.0 g/dL   RDW 13.7 11.5 - 15.5 %   Platelets 215 150 - 400 K/uL   nRBC 0.0 0.0 - 0.2 %    Comment: Performed at [facility], [place] [address], [place], [place] [address]  Glucose, capillary     Status: Abnormal   Collection Time: 01/05/20  8:08 AM  Result Value Ref Range   Glucose-Capillary 198 (H) 70 - 99 mg/dL    Comment: Glucose reference range applies only to samples taken after fasting for at least 8 hours.  Glucose, capillary     Status: Abnormal   Collection Time: 01/05/20 12:19 PM  Result Value Ref Range   Glucose-Capillary 281 (H) 70 - 99 mg/dL    Comment: Glucose reference range applies only to samples taken after fasting for at least 8 hours.   No results found.     Medical Problem List and Plan: 1.  Right-sided hemiparesis and aphasia secondary to left MCA infarction/petechial hemorrhage with saccular aneurysm at the paraclinoid right ICA status post mechanical thrombectomy  -patient may shower  -ELOS/Goals: 20-24 days/min/mod A.  Admit to CIR 2.  Antithrombotics: -DVT/anticoagulation: Eliquis  -antiplatelet therapy: N/A 3. Pain Management: Neurontin 100 mg every 8 hours, Zanaflex 2 mg twice daily as needed  Monitor with increased exertion 4. Mood: Provide emotional support  -antipsychotic agents: N/A 5. Neuropsych: This patient is not capable of making decisions on her own behalf. 6. Skin/Wound Care: Routine skin checks 7. Fluids/Electrolytes/Nutrition: Routine in and outs.  CMP ordered for tomorrow. 8.  Post stroke dysphagia.  Dysphagia #1 pudding thick liquids.  Follow-up speech therapy. 9.  Hypertension.  Coreg 50 mg twice daily,Bidil 20-37.5 mg 2 tabs 3 times daily  Monitor with increased mobility 10.  Atrial fibrillation.  Lanoxin 0.125 mg daily.  Cardiac rate controlled 11.  Chronic diastolic congestive heart failure.  Demadex 20 mg daily.  Monitor for any signs of fluid overload  Daily  weights 12.  Diabetes mellitus with peripheral neuropathy.  Hemoglobin A1c 11.6.  NovoLog 2 units every 4 hours,  Lantus insulin 60 units nightly, Glucophage 500 mg twice daily  Monitor with increased mobility 13.  COPD with tobacco abuse.  Continue nebulizer treatments 14.  Polysubstance abuse.  Urine drug screen positive cocaine.  Counseled when appropriate 15.  Hyperlipidemia.  Crestor 16.  GERD.  Protonix  Lavon Paganini Angiulli, PA-C 01/05/2020  I have personally performed a face to face diagnostic evaluation, including, but not limited to relevant history and physical exam findings, of this patient and developed relevant assessment and plan.  Additionally, I have reviewed and concur with the physician assistant's documentation above.  Delice Lesch, MD, ABPMR

## 2020-01-05 ENCOUNTER — Other Ambulatory Visit: Payer: Self-pay

## 2020-01-05 ENCOUNTER — Encounter (HOSPITAL_COMMUNITY): Payer: Self-pay | Admitting: Physical Medicine & Rehabilitation

## 2020-01-05 ENCOUNTER — Inpatient Hospital Stay (HOSPITAL_COMMUNITY)
Admission: RE | Admit: 2020-01-05 | Discharge: 2020-02-02 | DRG: 057 | Disposition: A | Discharge status: Home Health | Payer: 59 | Source: Intra-hospital | Attending: Physical Medicine & Rehabilitation | Admitting: Physical Medicine & Rehabilitation

## 2020-01-05 DIAGNOSIS — E6609 Other obesity due to excess calories: Secondary | ICD-10-CM

## 2020-01-05 DIAGNOSIS — Z794 Long term (current) use of insulin: Secondary | ICD-10-CM

## 2020-01-05 DIAGNOSIS — G8191 Hemiplegia, unspecified affecting right dominant side: Secondary | ICD-10-CM

## 2020-01-05 DIAGNOSIS — Z90710 Acquired absence of both cervix and uterus: Secondary | ICD-10-CM | POA: Diagnosis not present

## 2020-01-05 DIAGNOSIS — I63512 Cerebral infarction due to unspecified occlusion or stenosis of left middle cerebral artery: Secondary | ICD-10-CM | POA: Diagnosis not present

## 2020-01-05 DIAGNOSIS — E1142 Type 2 diabetes mellitus with diabetic polyneuropathy: Secondary | ICD-10-CM

## 2020-01-05 DIAGNOSIS — Z91030 Bee allergy status: Secondary | ICD-10-CM | POA: Diagnosis not present

## 2020-01-05 DIAGNOSIS — I69251 Hemiplegia and hemiparesis following other nontraumatic intracranial hemorrhage affecting right dominant side: Secondary | ICD-10-CM | POA: Diagnosis not present

## 2020-01-05 DIAGNOSIS — E876 Hypokalemia: Secondary | ICD-10-CM | POA: Diagnosis present

## 2020-01-05 DIAGNOSIS — I5032 Chronic diastolic (congestive) heart failure: Secondary | ICD-10-CM | POA: Diagnosis present

## 2020-01-05 DIAGNOSIS — I69291 Dysphagia following other nontraumatic intracranial hemorrhage: Secondary | ICD-10-CM

## 2020-01-05 DIAGNOSIS — I69298 Other sequelae of other nontraumatic intracranial hemorrhage: Secondary | ICD-10-CM | POA: Diagnosis not present

## 2020-01-05 DIAGNOSIS — N390 Urinary tract infection, site not specified: Secondary | ICD-10-CM | POA: Diagnosis not present

## 2020-01-05 DIAGNOSIS — K760 Fatty (change of) liver, not elsewhere classified: Secondary | ICD-10-CM | POA: Diagnosis present

## 2020-01-05 DIAGNOSIS — Z9049 Acquired absence of other specified parts of digestive tract: Secondary | ICD-10-CM

## 2020-01-05 DIAGNOSIS — R4701 Aphasia: Secondary | ICD-10-CM | POA: Diagnosis not present

## 2020-01-05 DIAGNOSIS — R040 Epistaxis: Secondary | ICD-10-CM | POA: Diagnosis present

## 2020-01-05 DIAGNOSIS — Z811 Family history of alcohol abuse and dependence: Secondary | ICD-10-CM | POA: Diagnosis not present

## 2020-01-05 DIAGNOSIS — J449 Chronic obstructive pulmonary disease, unspecified: Secondary | ICD-10-CM | POA: Diagnosis present

## 2020-01-05 DIAGNOSIS — X58XXXA Exposure to other specified factors, initial encounter: Secondary | ICD-10-CM | POA: Diagnosis not present

## 2020-01-05 DIAGNOSIS — I4891 Unspecified atrial fibrillation: Secondary | ICD-10-CM | POA: Diagnosis present

## 2020-01-05 DIAGNOSIS — Z23 Encounter for immunization: Secondary | ICD-10-CM

## 2020-01-05 DIAGNOSIS — I10 Essential (primary) hypertension: Secondary | ICD-10-CM

## 2020-01-05 DIAGNOSIS — R1310 Dysphagia, unspecified: Secondary | ICD-10-CM | POA: Diagnosis present

## 2020-01-05 DIAGNOSIS — N1831 Chronic kidney disease, stage 3a: Secondary | ICD-10-CM

## 2020-01-05 DIAGNOSIS — I69220 Aphasia following other nontraumatic intracranial hemorrhage: Secondary | ICD-10-CM | POA: Diagnosis not present

## 2020-01-05 DIAGNOSIS — N179 Acute kidney failure, unspecified: Secondary | ICD-10-CM

## 2020-01-05 DIAGNOSIS — I110 Hypertensive heart disease with heart failure: Secondary | ICD-10-CM | POA: Diagnosis present

## 2020-01-05 DIAGNOSIS — Z888 Allergy status to other drugs, medicaments and biological substances status: Secondary | ICD-10-CM | POA: Diagnosis not present

## 2020-01-05 DIAGNOSIS — B961 Klebsiella pneumoniae [K. pneumoniae] as the cause of diseases classified elsewhere: Secondary | ICD-10-CM | POA: Diagnosis not present

## 2020-01-05 DIAGNOSIS — Z6834 Body mass index (BMI) 34.0-34.9, adult: Secondary | ICD-10-CM

## 2020-01-05 DIAGNOSIS — I720 Aneurysm of carotid artery: Secondary | ICD-10-CM

## 2020-01-05 DIAGNOSIS — R159 Full incontinence of feces: Secondary | ICD-10-CM | POA: Diagnosis not present

## 2020-01-05 DIAGNOSIS — I6340 Cerebral infarction due to embolism of unspecified cerebral artery: Secondary | ICD-10-CM

## 2020-01-05 DIAGNOSIS — K219 Gastro-esophageal reflux disease without esophagitis: Secondary | ICD-10-CM | POA: Diagnosis present

## 2020-01-05 DIAGNOSIS — E1121 Type 2 diabetes mellitus with diabetic nephropathy: Secondary | ICD-10-CM

## 2020-01-05 DIAGNOSIS — B9620 Unspecified Escherichia coli [E. coli] as the cause of diseases classified elsewhere: Secondary | ICD-10-CM | POA: Diagnosis not present

## 2020-01-05 DIAGNOSIS — I951 Orthostatic hypotension: Secondary | ICD-10-CM | POA: Diagnosis not present

## 2020-01-05 DIAGNOSIS — E785 Hyperlipidemia, unspecified: Secondary | ICD-10-CM | POA: Diagnosis present

## 2020-01-05 DIAGNOSIS — F1410 Cocaine abuse, uncomplicated: Secondary | ICD-10-CM

## 2020-01-05 DIAGNOSIS — M79604 Pain in right leg: Secondary | ICD-10-CM | POA: Diagnosis not present

## 2020-01-05 DIAGNOSIS — I5023 Acute on chronic systolic (congestive) heart failure: Secondary | ICD-10-CM

## 2020-01-05 DIAGNOSIS — S93491A Sprain of other ligament of right ankle, initial encounter: Secondary | ICD-10-CM | POA: Diagnosis not present

## 2020-01-05 DIAGNOSIS — I69391 Dysphagia following cerebral infarction: Secondary | ICD-10-CM

## 2020-01-05 DIAGNOSIS — Z87891 Personal history of nicotine dependence: Secondary | ICD-10-CM | POA: Diagnosis not present

## 2020-01-05 DIAGNOSIS — I69351 Hemiplegia and hemiparesis following cerebral infarction affecting right dominant side: Secondary | ICD-10-CM | POA: Diagnosis present

## 2020-01-05 DIAGNOSIS — Z833 Family history of diabetes mellitus: Secondary | ICD-10-CM

## 2020-01-05 DIAGNOSIS — R197 Diarrhea, unspecified: Secondary | ICD-10-CM | POA: Diagnosis not present

## 2020-01-05 DIAGNOSIS — Z880 Allergy status to penicillin: Secondary | ICD-10-CM

## 2020-01-05 DIAGNOSIS — G936 Cerebral edema: Secondary | ICD-10-CM

## 2020-01-05 DIAGNOSIS — R339 Retention of urine, unspecified: Secondary | ICD-10-CM | POA: Diagnosis not present

## 2020-01-05 DIAGNOSIS — Z79899 Other long term (current) drug therapy: Secondary | ICD-10-CM

## 2020-01-05 DIAGNOSIS — Z7901 Long term (current) use of anticoagulants: Secondary | ICD-10-CM

## 2020-01-05 DIAGNOSIS — E1143 Type 2 diabetes mellitus with diabetic autonomic (poly)neuropathy: Secondary | ICD-10-CM

## 2020-01-05 DIAGNOSIS — F1910 Other psychoactive substance abuse, uncomplicated: Secondary | ICD-10-CM | POA: Diagnosis present

## 2020-01-05 LAB — CBC
HCT: 42.2 % (ref 36.0–46.0)
Hemoglobin: 13.2 g/dL (ref 12.0–15.0)
MCH: 29.3 pg (ref 26.0–34.0)
MCHC: 31.3 g/dL (ref 30.0–36.0)
MCV: 93.6 fL (ref 80.0–100.0)
Platelets: 215 10*3/uL (ref 150–400)
RBC: 4.51 MIL/uL (ref 3.87–5.11)
RDW: 13.7 % (ref 11.5–15.5)
WBC: 9.2 10*3/uL (ref 4.0–10.5)
nRBC: 0 % (ref 0.0–0.2)

## 2020-01-05 LAB — GLUCOSE, CAPILLARY
Glucose-Capillary: 162 mg/dL — ABNORMAL HIGH (ref 70–99)
Glucose-Capillary: 173 mg/dL — ABNORMAL HIGH (ref 70–99)
Glucose-Capillary: 177 mg/dL — ABNORMAL HIGH (ref 70–99)
Glucose-Capillary: 198 mg/dL — ABNORMAL HIGH (ref 70–99)
Glucose-Capillary: 224 mg/dL — ABNORMAL HIGH (ref 70–99)
Glucose-Capillary: 281 mg/dL — ABNORMAL HIGH (ref 70–99)

## 2020-01-05 MED ORDER — ROSUVASTATIN CALCIUM 20 MG PO TABS: 20.0000 mg | ORAL_TABLET | Freq: Every day | ORAL | Status: DC

## 2020-01-05 MED ORDER — APIXABAN 5 MG PO TABS
5.0000 mg | ORAL_TABLET | Freq: Two times a day (BID) | ORAL | Status: DC
  Administered 2020-01-05 – 2020-02-02 (×56): 5 mg
  Filled 2020-01-05 (×55): qty 1

## 2020-01-05 MED ORDER — RESOURCE THICKENUP CLEAR PO POWD
ORAL | Status: DC | PRN
  Filled 2020-01-05: qty 125

## 2020-01-05 MED ORDER — METFORMIN HCL 500 MG PO TABS
500.0000 mg | ORAL_TABLET | Freq: Two times a day (BID) | ORAL | Status: DC
  Administered 2020-01-06 – 2020-01-14 (×17): 500 mg via ORAL
  Filled 2020-01-05 (×17): qty 1

## 2020-01-05 MED ORDER — INSULIN ASPART 100 UNIT/ML ~~LOC~~ SOLN: 2.0000 [IU] | SUBCUTANEOUS | 11 refills | Status: DC

## 2020-01-05 MED ORDER — TORSEMIDE 20 MG PO TABS: 20.0000 mg | ORAL_TABLET | Freq: Every day | ORAL | Status: DC

## 2020-01-05 MED ORDER — SODIUM CHLORIDE 0.9% FLUSH: 10.0000 mL | INTRAVENOUS | Status: DC | PRN

## 2020-01-05 MED ORDER — APIXABAN 5 MG PO TABS: 5.0000 mg | ORAL_TABLET | Freq: Two times a day (BID) | ORAL | Status: DC

## 2020-01-05 MED ORDER — SENNOSIDES-DOCUSATE SODIUM 8.6-50 MG PO TABS: 1.0000 | ORAL_TABLET | Freq: Every evening | ORAL | Status: DC | PRN

## 2020-01-05 MED ORDER — PRO-STAT SUGAR FREE PO LIQD
30.0000 mL | Freq: Every day | ORAL | Status: DC
  Administered 2020-01-06: 30 mL
  Filled 2020-01-05: qty 30

## 2020-01-05 MED ORDER — POTASSIUM CHLORIDE 20 MEQ PO PACK
40.0000 meq | PACK | Freq: Every day | ORAL | Status: DC
Start: 2020-01-06 — End: 2020-02-02

## 2020-01-05 MED ORDER — GABAPENTIN 250 MG/5ML PO SOLN
100.0000 mg | Freq: Three times a day (TID) | ORAL | Status: DC
  Administered 2020-01-05 – 2020-02-02 (×83): 100 mg
  Filled 2020-01-05 (×88): qty 2

## 2020-01-05 MED ORDER — INSULIN ASPART 100 UNIT/ML ~~LOC~~ SOLN
0.0000 [IU] | SUBCUTANEOUS | Status: DC
  Administered 2020-01-05 – 2020-01-06 (×2): 4 [IU] via SUBCUTANEOUS
  Administered 2020-01-06: 7 [IU] via SUBCUTANEOUS
  Administered 2020-01-06 (×2): 4 [IU] via SUBCUTANEOUS

## 2020-01-05 MED ORDER — ORAL CARE MOUTH RINSE: 15.0000 mL | Freq: Two times a day (BID) | OROMUCOSAL | 0 refills | Status: DC

## 2020-01-05 MED ORDER — POTASSIUM CHLORIDE 20 MEQ PO PACK
40.0000 meq | PACK | Freq: Every day | ORAL | Status: DC
  Administered 2020-01-06 – 2020-01-25 (×20): 40 meq
  Filled 2020-01-05 (×21): qty 2

## 2020-01-05 MED ORDER — CARVEDILOL 25 MG PO TABS
50.0000 mg | ORAL_TABLET | Freq: Two times a day (BID) | ORAL | Status: DC
  Administered 2020-01-06 – 2020-01-19 (×27): 50 mg
  Filled 2020-01-05 (×26): qty 2
  Filled 2020-01-05: qty 4

## 2020-01-05 MED ORDER — ISOSORB DINITRATE-HYDRALAZINE 20-37.5 MG PO TABS: 2.0000 | ORAL_TABLET | Freq: Three times a day (TID) | ORAL | Status: DC

## 2020-01-05 MED ORDER — PANTOPRAZOLE SODIUM 40 MG PO PACK
40.0000 mg | PACK | Freq: Every day | ORAL | Status: DC
  Administered 2020-01-06 – 2020-01-25 (×20): 40 mg
  Filled 2020-01-05 (×18): qty 20

## 2020-01-05 MED ORDER — INSULIN ASPART 100 UNIT/ML ~~LOC~~ SOLN
2.0000 [IU] | SUBCUTANEOUS | Status: DC
  Administered 2020-01-05 – 2020-01-06 (×7): 2 [IU] via SUBCUTANEOUS

## 2020-01-05 MED ORDER — PANTOPRAZOLE SODIUM 40 MG PO PACK: 40.0000 mg | PACK | Freq: Every day | ORAL | Status: DC

## 2020-01-05 MED ORDER — ACETAMINOPHEN 325 MG PO TABS: 650.0000 mg | ORAL_TABLET | ORAL | Status: DC | PRN

## 2020-01-05 MED ORDER — IPRATROPIUM-ALBUTEROL 0.5-2.5 (3) MG/3ML IN SOLN: 3.0000 mL | RESPIRATORY_TRACT | Status: DC | PRN

## 2020-01-05 MED ORDER — RESOURCE THICKENUP CLEAR PO POWD: 1.0000 | ORAL | Status: DC | PRN

## 2020-01-05 MED ORDER — ACETAMINOPHEN 650 MG RE SUPP: 650.0000 mg | RECTAL | Status: DC | PRN

## 2020-01-05 MED ORDER — ACETAMINOPHEN 325 MG PO TABS
650.0000 mg | ORAL_TABLET | ORAL | Status: DC | PRN
  Administered 2020-01-11 – 2020-01-13 (×4): 650 mg via ORAL
  Filled 2020-01-05 (×4): qty 2

## 2020-01-05 MED ORDER — ADULT MULTIVITAMIN W/MINERALS CH: 1.0000 | ORAL_TABLET | Freq: Every day | ORAL | Status: DC

## 2020-01-05 MED ORDER — TIZANIDINE HCL 2 MG PO TABS
2.0000 mg | ORAL_TABLET | Freq: Two times a day (BID) | ORAL | Status: DC | PRN
  Administered 2020-01-07 – 2020-01-25 (×9): 2 mg
  Filled 2020-01-05 (×9): qty 1

## 2020-01-05 MED ORDER — ROSUVASTATIN CALCIUM 20 MG PO TABS
20.0000 mg | ORAL_TABLET | Freq: Every day | ORAL | Status: DC
  Administered 2020-01-06 – 2020-02-02 (×28): 20 mg
  Filled 2020-01-05 (×28): qty 1

## 2020-01-05 MED ORDER — GABAPENTIN 250 MG/5ML PO SOLN: 100.0000 mg | Freq: Three times a day (TID) | ORAL | 12 refills | Status: DC

## 2020-01-05 MED ORDER — TIZANIDINE HCL 2 MG PO TABS: 2.0000 mg | ORAL_TABLET | Freq: Two times a day (BID) | ORAL | 0 refills | Status: DC | PRN

## 2020-01-05 MED ORDER — SODIUM CHLORIDE 0.9 % IV SOLN: 10.0000 mL | INTRAVENOUS | 0 refills | Status: DC | PRN

## 2020-01-05 MED ORDER — PRO-STAT SUGAR FREE PO LIQD: 30.0000 mL | Freq: Every day | ORAL | 0 refills | Status: DC

## 2020-01-05 MED ORDER — INSULIN ASPART 100 UNIT/ML ~~LOC~~ SOLN: 0.0000 [IU] | SUBCUTANEOUS | 11 refills | Status: DC

## 2020-01-05 MED ORDER — ACETAMINOPHEN 160 MG/5ML PO SOLN: 650.0000 mg | ORAL | 0 refills | Status: DC | PRN

## 2020-01-05 MED ORDER — GLUCERNA 1.2 CAL PO LIQD
1000.0000 mL | ORAL | Status: DC
  Administered 2020-01-05: 1000 mL
  Filled 2020-01-05: qty 1000

## 2020-01-05 MED ORDER — PNEUMOCOCCAL VAC POLYVALENT 25 MCG/0.5ML IJ INJ
0.5000 mL | INJECTION | INTRAMUSCULAR | Status: AC
  Administered 2020-01-09: 0.5 mL via INTRAMUSCULAR
  Filled 2020-01-05: qty 0.5

## 2020-01-05 MED ORDER — METFORMIN HCL 500 MG PO TABS
500.0000 mg | ORAL_TABLET | Freq: Two times a day (BID) | ORAL | Status: DC
  Administered 2020-01-05 (×2): 500 mg via ORAL
  Filled 2020-01-05 (×2): qty 1

## 2020-01-05 MED ORDER — CHLORHEXIDINE GLUCONATE CLOTH 2 % EX PADS: 6.0000 | MEDICATED_PAD | Freq: Every day | CUTANEOUS | Status: DC

## 2020-01-05 MED ORDER — CHLORHEXIDINE GLUCONATE 0.12 % MT SOLN: 15.0000 mL | Freq: Two times a day (BID) | OROMUCOSAL | 0 refills | Status: DC

## 2020-01-05 MED ORDER — TORSEMIDE 20 MG PO TABS
20.0000 mg | ORAL_TABLET | Freq: Every day | ORAL | Status: DC
  Administered 2020-01-06 – 2020-01-10 (×5): 20 mg
  Filled 2020-01-05 (×6): qty 1

## 2020-01-05 MED ORDER — DIGOXIN 125 MCG PO TABS
0.1250 mg | ORAL_TABLET | Freq: Every day | ORAL | Status: DC
  Administered 2020-01-06 – 2020-02-02 (×28): 0.125 mg
  Filled 2020-01-05 (×29): qty 1

## 2020-01-05 MED ORDER — INSULIN GLARGINE 100 UNIT/ML ~~LOC~~ SOLN: 60.0000 [IU] | Freq: Every day | SUBCUTANEOUS | 11 refills | Status: DC

## 2020-01-05 MED ORDER — CARVEDILOL 25 MG PO TABS: 50.0000 mg | ORAL_TABLET | Freq: Two times a day (BID) | ORAL | Status: DC

## 2020-01-05 MED ORDER — FREE WATER
200.0000 mL | Freq: Four times a day (QID) | Status: DC
  Administered 2020-01-05 – 2020-01-07 (×7): 200 mL

## 2020-01-05 MED ORDER — ADULT MULTIVITAMIN W/MINERALS CH
1.0000 | ORAL_TABLET | Freq: Every day | ORAL | Status: DC
  Administered 2020-01-06 – 2020-02-02 (×28): 1
  Filled 2020-01-05 (×28): qty 1

## 2020-01-05 MED ORDER — ACETAMINOPHEN 650 MG RE SUPP: 650.0000 mg | RECTAL | 0 refills | Status: DC | PRN

## 2020-01-05 MED ORDER — ISOSORB DINITRATE-HYDRALAZINE 20-37.5 MG PO TABS
2.0000 | ORAL_TABLET | Freq: Three times a day (TID) | ORAL | Status: DC
  Administered 2020-01-05 – 2020-01-31 (×77): 2
  Filled 2020-01-05 (×79): qty 2

## 2020-01-05 MED ORDER — INSULIN GLARGINE 100 UNIT/ML ~~LOC~~ SOLN
60.0000 [IU] | Freq: Every day | SUBCUTANEOUS | Status: DC
  Administered 2020-01-05 – 2020-01-06 (×2): 60 [IU] via SUBCUTANEOUS
  Filled 2020-01-05 (×3): qty 0.6

## 2020-01-05 MED ORDER — GLUCERNA 1.2 CAL PO LIQD: 1000.0000 mL | ORAL | Status: DC

## 2020-01-05 MED ORDER — METFORMIN HCL 500 MG PO TABS: 500.0000 mg | ORAL_TABLET | Freq: Two times a day (BID) | ORAL | Status: DC

## 2020-01-05 MED ORDER — DIGOXIN 125 MCG PO TABS: 0.1250 mg | ORAL_TABLET | Freq: Every day | ORAL | Status: DC

## 2020-01-05 MED ORDER — FREE WATER: 200.0000 mL | Freq: Four times a day (QID) | Status: DC

## 2020-01-05 MED ORDER — ACETAMINOPHEN 160 MG/5ML PO SOLN
650.0000 mg | ORAL | Status: DC | PRN
  Administered 2020-01-08 – 2020-01-12 (×3): 650 mg
  Filled 2020-01-05 (×3): qty 20.3

## 2020-01-05 NOTE — Progress Notes (Signed)
Inpatient Diabetes Program Recommendations  AACE/ADA: New Consensus Statement on Inpatient Glycemic Control  Target Ranges:  Prepandial:   less than 140 mg/dL      Peak postprandial:   less than 180 mg/dL (1-2 hours)      Critically ill patients:  140 - 180 mg/dL   Results for Robin [name], Robin [name] (MRN [id number]) as of 01/05/2020 12:19  Ref. Range 01/04/2020 08:04 01/04/2020 11:56 01/04/2020 15:48 01/04/2020 20:03 01/04/2020 23:45 01/05/2020 03:39 01/05/2020 08:08  Glucose-Capillary Latest Ref Range: 70 - 99 mg/dL 174 (H) 227 (H) 223 (H) 273 (H) 232 (H) 173 (H) 198 (H)  Results for [name], [name] (MRN [id number]) as of 01/05/2020 12:19  Ref. Range 05/10/2019 13:22 06/28/2019 10:00 09/13/2019 07:38 12/27/2019 03:05  Hemoglobin A1C Latest Ref Range: 4.8 - 5.6 % 12.0 (H) 11.4 (H) 10.3 (A) 11.6 (H)   Review of Glycemic Control  Diabetes history: DM2 Outpatient Diabetes medications: 70/30 30 units BID, Metformin 123XX123 mg daily, Trulicity A999333 mg Qweek Current orders for Inpatient glycemic control: Lantus 60 units QHS, Novolog 2 units Q4H for tube feeding coverage, Novolog 0-20 units Q4H, Metformin 500 mg BID; Glucerna @ 60 ml/hr  Inpatient Diabetes Program Recommendations:    HbgA1C:  A1C 11.6% on 12/29/19 indicating an average glucose of 286 mg/dl over the past 2-3 months.  Insulin-Tube Feeding coverage: Please consider increasing tube feeding coverage to Novolog 4 units Q4H if glucose becomes consistently over 180 mg/dl.  NOTE: Spoke with patient regarding DM control and A1C. Patient was initially asleep but woke easily when her name was spoken. Patient did not speak during conversation but mumbled words at time and shook her head to indicate yes or no to questions asked. Patient indicates that she was taking DM medications as noted above and she indicates that glucose was usually in the 200 's mg/dl or higher. Patient states that she has everything at home needed for DM control. Discussed current A1C of 11.6% and  explained that her current A1C indicates an average glucose of 286 mg/dl over the past 2-3 months. Discussed glucose and A1C goals. Discussed importance of glycemic control to decrease further complications from uncontrolled DM.  Explained that currently she is ordered Lantus, Novolog correction, Novolog tube feeding coverage, and Metformin. Patient will be discharging to CIR. Encouraged patient to take DM medications as prescribed once she is able to discharge home and to be sure to follow up with PCP regarding DM control. Patient shook her head up and down to indicate "yes" when asked if she understood information discussed.  Thanks, Barnie Alderman, RN, MSN, CDE Diabetes Coordinator Inpatient Diabetes Program [phone] (Team Pager from 8am to 5pm)

## 2020-01-05 NOTE — H&P (Signed)
Physical Medicine and Rehabilitation Admission H&P    Chief Complaint  Patient presents with  . Unresponsive  : HPI: Robin [name] is a 54 year old right-handed female history of diastolic congestive heart failure, hypertension, atrial fibrillation on Eliquis, COPD, polysubstance abuse, diabetes mellitus and tobacco abuse.  He presented on 12/27/2019 with right hemiparesis, AMS, and aphasia to [facility].  Cranial CT scan showed question evolving hypodensity involving the left basal ganglia left insula and adjacent left temporal lobe.  CT perfusion scan as well as CTA of head and neck showed a 24 cc acute infarct deep and subcortical white matter of the anterior left frontal region.  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  Patient did not receive TPA.  Patient was transferred to [facility].  MRI showed evolving left MCA infarction with hemorrhagic transformation.  Mass-effect with 6 mm rightward midline shift.  Admission chemistries urine drug screen positive cocaine, glucose 355, creatinine 1.15, troponin negative, BNP 367, alcohol negative.  Echocardiogram with ejection fraction of 35% with global hypokinesis.  Patient underwent mechanical thrombectomy per interventional radiology required intubation and was extubated 12/28/2019.  Initially maintained on low-dose aspirin and transitioned back to Eliquis.  Hospital course further complicated by dysphagia.  Started on dysphagia #1 pudding thick liquid diet with nasogastric tube for nutritional support.  Therapy evaluations completed and patient was admitted for a comprehensive rehab program.  Please see preadmission assessment from earlier today as well.  Review of Systems  Unable to perform ROS: Mental acuity   Past Medical History:  Diagnosis Date  . Allergy   . Anemia   . Arthritis   . CHF (congestive heart failure) ([place])    a. EF at 30-35% by echo in 08/2018 b. EF at 35% by repeat echo in 05/2019  .  Chronic abdominal pain   . Cocaine abuse ([place])   . COPD (chronic obstructive pulmonary disease) ([place])   . Essential hypertension, benign   . Fatty liver   . GERD (gastroesophageal reflux disease)   . Gout 2016  . Normal coronary arteries    3/10 - following abnormal Myoview  . Ovarian cyst   . Type 2 diabetes mellitus ([place])    Past Surgical History:  Procedure Laterality Date  . ABDOMINAL HYSTERECTOMY  09/10/2011   Procedure: HYSTERECTOMY ABDOMINAL;  Surgeon: Jonnie Kind, MD;  Location: [facility];  Service: Gynecology;  Laterality: N/A;  Abdominal hysterectomy  . CESAREAN SECTION  [id number], and 1994  . CHOLECYSTECTOMY  1995  . IR CT HEAD LTD  12/27/2019  . IR PERCUTANEOUS ART THROMBECTOMY/INFUSION INTRACRANIAL INC DIAG ANGIO  12/27/2019  . IR US GUIDE VASC ACCESS RIGHT  12/27/2019  . RADIOLOGY WITH ANESTHESIA N/A 12/27/2019   Procedure: IR WITH ANESTHESIA;  Surgeon: Luanne Bras, MD;  Location: [facility];  Service: Radiology;  Laterality: N/A;  . SCAR REVISION  09/10/2011   Procedure: SCAR REVISION;  Surgeon: Jonnie Kind, MD;  Location: [facility];  Service: Gynecology;  Laterality: N/A;  Wide Excision of old Cicatrix  . TUBAL LIGATION  1994   Family History  Problem Relation Age of Onset  . Cirrhosis Mother   . Early death Mother   . Alcohol abuse Mother   . Diabetes type II Father   . Alcohol abuse Father   . Diabetes type II Sister   . Early death Brother   . Alcohol abuse Son   . Anesthesia problems Neg Hx   .  Hypotension Neg Hx   . Malignant hyperthermia Neg Hx   . Pseudochol deficiency Neg Hx   . Colon cancer Neg Hx   . Colon polyps Neg Hx   . Esophageal cancer Neg Hx   . Rectal cancer Neg Hx   . Stomach cancer Neg Hx    Social History:  reports that she has quit smoking. Her smoking use included cigarettes. She has a 7.25 pack-year smoking history. She has never used smokeless tobacco. She reports current alcohol use. She reports previous drug use. Drugs: Cocaine and  Marijuana. Allergies:  Allergies  Allergen Reactions  . Bee Venom Shortness Of Breath and Swelling    Bodily Swelling  . Losartan Other (See Comments)    Nosebleeds per patient report.   . Naproxen Other (See Comments)    Effects acid reflux  . Penicillins Nausea Only    Has patient had a PCN reaction causing immediate rash, facial/tongue/throat swelling, SOB or lightheadedness with hypotension: no Has patient had a PCN reaction causing severe rash involving mucus membranes or skin necrosis: no Has patient had a PCN reaction that required hospitalization no Has patient had a PCN reaction occurring within the last 10 years: no If all of the above answers are "NO", then may proceed with Cephalosporin   . Lisinopril     Sinus congestion   Medications Prior to Admission  Medication Sig Dispense Refill  . acetaminophen (TYLENOL) 325 MG tablet Take 2 tablets (650 mg total) by mouth every 6 (six) hours as needed for mild pain, fever or headache (or Fever >/= 101). 12 tablet 0  . albuterol (PROVENTIL) (2.5 MG/3ML) 0.083% nebulizer solution Take 3 mLs (2.5 mg total) by nebulization every 6 (six) hours as needed for wheezing or shortness of breath. 75 mL 5  . albuterol (VENTOLIN HFA) 108 (90 Base) MCG/ACT inhaler Inhale 2 puffs into the lungs every 6 (six) hours as needed for wheezing or shortness of breath. 18 g 5  . apixaban (ELIQUIS) 5 MG TABS tablet Take 1 tablet (5 mg total) by mouth 2 (two) times daily. 60 tablet 5  . carvedilol (COREG) 25 MG tablet Take 2 tablets (50 mg total) by mouth 2 (two) times daily with a meal. 120 tablet 6  . cephALEXin (KEFLEX) 500 MG capsule Take 1 capsule (500 mg total) by mouth 3 (three) times daily. 15 capsule 0  . digoxin (LANOXIN) 0.125 MG tablet Take 1 tablet (0.125 mg total) by mouth daily. 30 tablet 5  . Dulaglutide (TRULICITY) A999333 0000000 SOPN Inject 0.75 mg into the skin once a week. 4 pen 3  . gabapentin (NEURONTIN) 100 MG capsule Take 1 capsule (100  mg total) by mouth 3 (three) times daily. 90 capsule 5  . glucose blood (CONTOUR NEXT TEST) test strip 2x daily 100 each 12  . hydrALAZINE (APRESOLINE) 50 MG tablet Take 1 tablet (50 mg total) by mouth 3 (three) times daily. 90 tablet 5  . insulin aspart protamine - aspart (NOVOLOG MIX 70/30 FLEXPEN) (70-30) 100 UNIT/ML FlexPen Inject 0.3 mLs (30 Units total) into the skin 2 (two) times daily. (Patient taking differently: Inject into the skin 2 (two) times daily. 20 units in am and 30 in the pm) 30 mL 6  . Insulin Pen Needle 32G X 4 MM MISC 1 Device by Does not apply route as directed. 100 each 6  . metFORMIN (GLUCOPHAGE) 1000 MG tablet Take 1 tablet (1,000 mg total) by mouth daily with breakfast. 90 tablet 3  .  pantoprazole (PROTONIX) 40 MG tablet Take 1 tablet (40 mg total) by mouth daily. 30 tablet 2  . potassium chloride SA (KLOR-CON) 20 MEQ tablet Take 1 tablet (20 mEq total) by mouth 2 (two) times daily. 60 tablet 3  . tizanidine (ZANAFLEX) 2 MG capsule Take 1 capsule (2 mg total) by mouth 2 (two) times daily as needed for muscle spasms. 15 capsule 0  . torsemide (DEMADEX) 20 MG tablet Take 1 tablet (20 mg total) by mouth 2 (two) times daily. 60 tablet 5  . triamcinolone cream (KENALOG) 0.1 % Apply to affected area one to times daily 45 g 0    Drug Regimen Review Drug regimen was reviewed and remains appropriate with no significant issues identified  Home: Home Living Family/patient expects to be discharged to:: Private residence Living Arrangements: Spouse/significant other(will likely go to son or daughter's home) Available Help at Discharge: Family, Available 24 hours/day Type of Home: House Home Access: Stairs to enter [employer] of Steps: 3-4 Entrance Stairs-Rails: Right Home Layout: One level Bathroom Shower/Tub: Chiropodist: Standard Home Equipment: None Additional Comments: per chart and RN report, sister has been present and supportive. 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 use of bed pad to scoot hips to EOB Transfers Overall transfer level: Needs assistance Equipment used: Ambulation equipment used Transfer via Lift Equipment: Stedy Transfers: Sit to/from Stand Sit to Stand: +2 physical assistance, Mod assist, Max assist Squat pivot transfers: Max assist, +2 physical assistance  Lateral/Scoot Transfers: Max assist, +2 physical assistance, +2 safety/equipment General transfer comment: pt initially required MOD A for sit<>stand from EOB, however pt required MAX A from low BSC to power into standing. Pt with R anterior lean during session needing verbal and tactile cues to stand upright Ambulation/Gait Ambulation/Gait assistance: Max assist, +2 physical assistance Gait Distance (Feet): 1 Feet Assistive device: 2 [place] hand held assist(holding back of recliner) Gait Pattern/deviations: Step-to pattern, Shuffle, Wide base of support General Gait Details: scooting L foot up toward [facility] and assist for weight shift and to move R foot over x 2-3 "steps"    ADL: ADL Overall ADL's : Needs assistance/impaired Grooming: Wash/dry face, Sitting Grooming Details (indicate cue type and reason): with LUE sitting on stedy at sink Toilet Transfer: Maximal assistance, +2 for physical assistance, +2 for  safety/equipment, Cueing for safety, Moderate assistance Toilet Transfer Details (indicate cue type and reason): +2 mod- max assist for sit<>stand with stedy to [place]- Clothing Manipulation and Hygiene: Total assistance, +2 for physical assistance, Sit to/from stand, Bed level Toileting - Clothing Manipulation Details (indicate cue type and reason): total A +2 to attempt pericare in standing however pt unable to tolerate extensive standing d/t R anterior lean; would likely needing total A from bed level Functional mobility during ADLs: Maximal assistance, +2 for physical assistance, +2 for safety/equipment, Cueing for safety, Cueing for sequencing, Moderate assistance General  ADL Comments: pt continues to present with decreased activity tolerance, R sided weakness and impaired functional mobility impacting pts ability to engage in BADLs.  Cognition: Cognition Overall Cognitive Status: Difficult to assess Arousal/Alertness: Awake/alert Orientation Level: Oriented to [place] Attention: Sustained Sustained Attention: Appears intact Awareness: Impaired Awareness Impairment: Emergent impairment Problem Solving: Appears intact(functional, basic) Cognition Arousal/Alertness: Awake/alert Behavior During Therapy: Flat affect Overall Cognitive Status: Difficult to assess General Comments: pt continues to be flat during sessions presenting with eyes closed often, however pt gesturing appropriately to bathroom when needing to void bowels Difficult to assess due to: Impaired communication  Physical Exam: Blood pressure 106/75, pulse 99, temperature 98.6 F (37 C), temperature source Oral, resp. rate 19, height 5\' 6"  (1.676 m), weight 98 kg, last menstrual period 08/21/2011, SpO2 100 %. Physical Exam  Vitals reviewed. Constitutional: She appears well-developed.  Obese  HENT:  Head: Normocephalic and atraumatic.  Nasogastric tube in place  Eyes: Right eye exhibits no discharge. Left eye  exhibits no discharge.  Only briefly opens eyes  Neck: No tracheal deviation present. No thyromegaly present.  Respiratory: Effort normal. No stridor. No respiratory distress.  GI: Soft. She exhibits no distension.  Musculoskeletal:     Comments: No edema or tenderness in extremities  Neurological: She is alert.  Lethargic No acute distress. Makes eye contact with examiner.   Global aphasia Motor: Limited to follow commands, moving left side Spontaneously, no movement noted on right side. Right lean  Skin: Skin is warm and dry.  Psychiatric: Her speech is delayed and slurred. She is slowed. Cognition and memory are impaired.    Results for orders placed or performed during the hospital encounter of 12/26/19 (from the past 48 hour(s))  Glucose, capillary     Status: Abnormal   Collection Time: 01/03/20  5:43 PM  Result Value Ref Range   Glucose-Capillary 177 (H) 70 - 99 mg/dL    Comment: Glucose reference range applies only to samples taken after fasting for at least 8 hours.  Glucose, capillary     Status: Abnormal   Collection Time: 01/03/20  7:58 PM  Result Value Ref Range   Glucose-Capillary 296 (H) 70 - 99 mg/dL    Comment: Glucose reference range applies only to samples taken after fasting for at least 8 hours.  Glucose, capillary     Status: Abnormal   Collection Time: 01/03/20 11:55 PM  Result Value Ref Range   Glucose-Capillary 185 (H) 70 - 99 mg/dL    Comment: Glucose reference range applies only to samples taken after fasting for at least 8 hours.  Glucose, capillary     Status: Abnormal   Collection Time: 01/04/20  4:03 AM  Result Value Ref Range   Glucose-Capillary 110 (H) 70 - 99 mg/dL    Comment: Glucose reference range applies only to samples taken after fasting for at least 8 hours.  CBC     Status: None   Collection Time: 01/04/20  6:48 AM  Result Value Ref Range   WBC 9.5 4.0 - 10.5 K/uL   RBC 4.39 3.87 - 5.11 MIL/uL   Hemoglobin 12.9 12.0 - 15.0 g/dL    HCT 41.6 36.0 - 46.0 %   MCV 94.8 80.0 - 100.0 fL   MCH 29.4 26.0 - 34.0 pg   MCHC 31.0 30.0 - 36.0 g/dL   RDW 13.7 11.5 - 15.5 %   Platelets 223 150 - 400 K/uL   nRBC 0.0 0.0 - 0.2 %    Comment:  Performed at [facility], [place] [address] [place], [place] [address]  Comprehensive metabolic panel     Status: Abnormal   Collection Time: 01/04/20  6:48 AM  Result Value Ref Range   Sodium 143 135 - 145 mmol/L   Potassium 4.1 3.5 - 5.1 mmol/L   Chloride 111 98 - 111 mmol/L   CO2 23 22 - 32 mmol/L   Glucose, Bld 139 (H) 70 - 99 mg/dL    Comment: Glucose reference range applies only to samples taken after fasting for at least 8 hours.   BUN 24 (H) 6 - 20 mg/dL   Creatinine, Ser 1.11 (H) 0.44 - 1.00 mg/dL   Calcium 8.8 (L) 8.9 - 10.3 mg/dL   Total Protein 6.4 (L) 6.5 - 8.1 g/dL   Albumin 2.9 (L) 3.5 - 5.0 g/dL   AST 46 (H) 15 - 41 U/L   ALT 33 0 - 44 U/L   Alkaline Phosphatase 65 38 - 126 U/L   Total Bilirubin 0.8 0.3 - 1.2 mg/dL   GFR calc non Af Amer 57 (L) >60 mL/min   GFR calc Af Amer >60 >60 mL/min   Anion gap 9 5 - 15    Comment: Performed at [facility] [address], [place], [place] [address]  Glucose, capillary     Status: Abnormal   Collection Time: 01/04/20  8:04 AM  Result Value Ref Range   Glucose-Capillary 174 (H) 70 - 99 mg/dL    Comment: Glucose reference range applies only to samples taken after fasting for at least 8 hours.   Comment 1 Notify RN    Comment 2 Document in Chart   Glucose, capillary     Status: Abnormal   Collection Time: 01/04/20 11:56 AM  Result Value Ref Range   Glucose-Capillary 227 (H) 70 - 99 mg/dL    Comment: Glucose reference range applies only to samples taken after fasting for at least 8 hours.   Comment 1 Notify RN    Comment 2 Document in Chart   Basic metabolic panel     Status: Abnormal   Collection Time: 01/04/20  2:18 PM  Result Value Ref Range   Sodium 142 135 - 145 mmol/L   Potassium 4.9 3.5 - 5.1 mmol/L    Chloride 108 98 - 111 mmol/L   CO2 23 22 - 32 mmol/L   Glucose, Bld 220 (H) 70 - 99 mg/dL    Comment: Glucose reference range applies only to samples taken after fasting for at least 8 hours.   BUN 28 (H) 6 - 20 mg/dL   Creatinine, Ser 1.20 (H) 0.44 - 1.00 mg/dL   Calcium 9.2 8.9 - 10.3 mg/dL   GFR calc non Af Amer 52 (L) >60 mL/min   GFR calc Af Amer 60 (L) >60 mL/min   Anion gap 11 5 - 15    Comment: Performed at [facility] [address], [place], [place] [address]  Glucose, capillary     Status: Abnormal   Collection Time: 01/04/20  3:48 PM  Result Value Ref Range   Glucose-Capillary 223 (H) 70 - 99 mg/dL    Comment: Glucose reference range applies only to samples taken after fasting for at least 8 hours.   Comment 1 Notify RN    Comment 2 Document in Chart   Glucose, capillary     Status: Abnormal   Collection Time: 01/04/20  8:03 PM  Result Value Ref Range  Glucose-Capillary 273 (H) 70 - 99 mg/dL    Comment: Glucose reference range applies only to samples taken after fasting for at least 8 hours.   Comment 1 Notify RN    Comment 2 Document in Chart   Glucose, capillary     Status: Abnormal   Collection Time: 01/04/20 11:45 PM  Result Value Ref Range   Glucose-Capillary 232 (H) 70 - 99 mg/dL    Comment: Glucose reference range applies only to samples taken after fasting for at least 8 hours.   Comment 1 Notify RN    Comment 2 Document in Chart   Glucose, capillary     Status: Abnormal   Collection Time: 01/05/20  3:39 AM  Result Value Ref Range   Glucose-Capillary 173 (H) 70 - 99 mg/dL    Comment: Glucose reference range applies only to samples taken after fasting for at least 8 hours.   Comment 1 Notify RN    Comment 2 Document in Chart   CBC     Status: None   Collection Time: 01/05/20  6:03 AM  Result Value Ref Range   WBC 9.2 4.0 - 10.5 K/uL   RBC 4.51 3.87 - 5.11 MIL/uL   Hemoglobin 13.2 12.0 - 15.0 g/dL   HCT 42.2 36.0 - 46.0 %   MCV 93.6 80.0 -  100.0 fL   MCH 29.3 26.0 - 34.0 pg   MCHC 31.3 30.0 - 36.0 g/dL   RDW 13.7 11.5 - 15.5 %   Platelets 215 150 - 400 K/uL   nRBC 0.0 0.0 - 0.2 %    Comment: Performed at [facility], [place] [address] [place], [place] [address]  Glucose, capillary     Status: Abnormal   Collection Time: 01/05/20  8:08 AM  Result Value Ref Range   Glucose-Capillary 198 (H) 70 - 99 mg/dL    Comment: Glucose reference range applies only to samples taken after fasting for at least 8 hours.  Glucose, capillary     Status: Abnormal   Collection Time: 01/05/20 12:19 PM  Result Value Ref Range   Glucose-Capillary 281 (H) 70 - 99 mg/dL    Comment: Glucose reference range applies only to samples taken after fasting for at least 8 hours.   No results found.     Medical Problem List and Plan: 1.  Right-sided hemiparesis and aphasia secondary to left MCA infarction/petechial hemorrhage with saccular aneurysm at the paraclinoid right ICA status post mechanical thrombectomy  -patient may shower  -ELOS/Goals: 20-24 days/min/mod A.  Admit to CIR 2.  Antithrombotics: -DVT/anticoagulation: Eliquis  -antiplatelet therapy: N/A 3. Pain Management: Neurontin 100 mg every 8 hours, Zanaflex 2 mg twice daily as needed  Monitor with increased exertion 4. Mood: Provide emotional support  -antipsychotic agents: N/A 5. Neuropsych: This patient is not capable of making decisions on her own behalf. 6. Skin/Wound Care: Routine skin checks 7. Fluids/Electrolytes/Nutrition: Routine in and outs.  CMP ordered for tomorrow. 8.  Post stroke dysphagia.  Dysphagia #1 pudding thick liquids.  Follow-up speech therapy. 9.  Hypertension.  Coreg 50 mg twice daily,Bidil 20-37.5 mg 2 tabs 3 times daily  Monitor with increased mobility 10.  Atrial fibrillation.  Lanoxin 0.125 mg daily.  Cardiac rate controlled 11.  Chronic diastolic congestive heart failure.  Demadex 20 mg daily.  Monitor for any signs of fluid overload  Daily  weights 12.  Diabetes mellitus with peripheral neuropathy.  Hemoglobin A1c 11.6.  NovoLog 2 units every 4 hours,  Lantus insulin 60 units nightly, Glucophage 500 mg twice daily  Monitor with increased mobility 13.  COPD with tobacco abuse.  Continue nebulizer treatments 14.  Polysubstance abuse.  Urine drug screen positive cocaine.  Counseled when appropriate 15.  Hyperlipidemia.  Crestor 16.  GERD.  Protonix  Lavon Paganini Angiulli, PA-C 01/05/2020  I have personally performed a face to face diagnostic evaluation, including, but not limited to relevant history and physical exam findings, of this patient and developed relevant assessment and plan.  Additionally, I have reviewed and concur with the physician assistant's documentation above.  Delice Lesch, MD, ABPMR  The patient's status has not changed. The original post admission physician evaluation remains appropriate, and any changes from the pre-admission screening or documentation from the acute chart are noted above.   Delice Lesch, MD, ABPMR

## 2020-01-05 NOTE — Progress Notes (Signed)
Physical Therapy Treatment Patient Details Name: Robin [name] MRN: [id number] DOB: 03/06/1966 Today's Date: 01/05/2020    History of Present Illness 54 y.o. female with a history of DM, HTN, atrial fibrillation on Eliquis, anemia, CHF, COPD and polysubstance abuse, presenting initially to [facility] ED after being found unresponsive at home after family heard her fall. Pt transferred/admitted to [facility] for acute left MCA stroke and L ICA occlusion, is now s/p mechanical thrombectomy on 5/24, pt kept intubated post procedure.     PT Comments    Patient continues to make progress toward PT goals. Continue to recommend CIR for further skilled PT services to maximize independence and safety with mobility.    Follow Up Recommendations  CIR;Supervision/Assistance - 24 hour     Equipment Recommendations  Hospital bed;Wheelchair (measurements PT);Wheelchair cushion (measurements PT);3in1 (PT)    Recommendations for Other Services Rehab consult     Precautions / Restrictions Precautions Precautions: Fall Precaution Comments: R hemi Restrictions Weight Bearing Restrictions: No    Mobility  Bed Mobility Overal bed mobility: Needs Assistance Bed Mobility: Supine to Sit     Supine to sit: HOB elevated;Mod assist;+2 for physical assistance     General bed mobility comments: pt with better initiation this session, pt requires assist to advance RLE to EOB and reach for rail with RUE. use of bed pad to scoot hips to EOB  Transfers Overall transfer level: Needs assistance Equipment used: Ambulation equipment used Transfers: Sit to/from Stand Sit to Stand: +2 physical assistance;Mod assist;Max assist         General transfer comment: pt initially required MOD A for sit<>stand from EOB, however pt required MAX A from low BSC to power into standing. Pt with R anterior lean during session needing verbal and tactile cues to stand upright  Ambulation/Gait                 Stairs              Wheelchair Mobility    Modified Rankin (Stroke Patients Only) Modified Rankin (Stroke Patients Only) Pre-Morbid Rankin Score: No symptoms Modified Rankin: Severe disability     Balance Overall balance assessment: Needs assistance Sitting-balance support: Feet supported;Single extremity supported Sitting balance-Leahy Scale: Fair Sitting balance - Comments: MIN A- min guard for sitting balance Postural control: Posterior lean;Right lateral lean Standing balance support: Bilateral upper extremity supported Standing balance-Leahy Scale: Poor Standing balance comment: relies on external support in stedy                             Cognition Arousal/Alertness: Awake/alert Behavior During Therapy: Flat affect Overall Cognitive Status: Difficult to assess                                 General Comments: pt continues to be flat during sessions presenting with eyes closed often, however pt gesturing appropriately to bathroom when needing to void bowels      Exercises Other Exercises Other Exercises: light PROM to RUE in supine; noted slight tone in R elbow    General Comments General comments (skin integrity, edema, etc.): pt became diaphoretic while sitting on BSC; pt able to have BM; BP 121/72 (87) HR 83 bpm      Pertinent Vitals/Pain Pain Assessment: Faces Faces Pain Scale: No hurt    Home Living  Prior Function            PT Goals (current goals can now be found in the care plan section) Acute Rehab PT Goals Patient Stated Goal: pt unable to state Progress towards PT goals: Progressing toward goals    Frequency    Min 4X/week      PT Plan Current plan remains appropriate    Co-evaluation PT/OT/SLP Co-Evaluation/Treatment: Yes Reason for Co-Treatment: Complexity of the patient's impairments (multi-system involvement);For patient/therapist safety;To address functional/ADL transfers PT  goals addressed during session: Mobility/safety with mobility;Balance OT goals addressed during session: ADL's and self-care      AM-PAC PT "6 Clicks" Mobility   Outcome Measure  Help needed turning from your back to your side while in a flat bed without using bedrails?: A Lot Help needed moving from lying on your back to sitting on the side of a flat bed without using bedrails?: A Lot Help needed moving to and from a bed to a chair (including a wheelchair)?: Total Help needed standing up from a chair using your arms (e.g., wheelchair or bedside chair)?: A Lot Help needed to walk in hospital room?: Total Help needed climbing 3-5 steps with a railing? : Total 6 Click Score: 9    End of Session Equipment Utilized During Treatment: Gait belt Activity Tolerance: Patient tolerated treatment well Patient left: with call bell/phone within reach;in chair;with chair alarm set Nurse Communication: Mobility status;Need for lift equipment PT Visit Diagnosis: Other abnormalities of gait and mobility (R26.89);Hemiplegia and hemiparesis;Other symptoms and signs involving the nervous system (R29.898) Hemiplegia - Right/Left: Right Hemiplegia - dominant/non-dominant: Dominant Hemiplegia - caused by: Cerebral infarction     Time: [id number] PT Time Calculation (min) (ACUTE ONLY): 34 min  Charges:  $Gait Training: 8-22 mins                     Earney Navy, PTA Acute Rehabilitation Services Pager: [phone] Office: [phone]     Darliss Cheney 01/05/2020, 10:53 AM

## 2020-01-05 NOTE — Progress Notes (Signed)
Occupational Therapy Treatment Patient Details Name: Robin [name] MRN: [id number] DOB: 08-10-1965 Today's Date: 01/05/2020    History of present illness 54 y.o. female with a history of DM, HTN, atrial fibrillation on Eliquis, anemia, CHF, COPD and polysubstance abuse, presenting initially to [facility] ED after being found unresponsive at home after family heard her fall. Pt transferred/admitted to [facility] for acute left MCA stroke and L ICA occlusion, is now s/p mechanical thrombectomy on 5/24, pt kept intubated post procedure.    OT comments  Pt seen in conjunction with PT to progress functional mobility. Pt continues to present with decreased activity tolerance, R sided weakness and impaired functional mobility impacting pts ability to engage in BADLs. Overall, pt requires MOD- MAX A for sit<>stand to stedy, total A for posterior pericare and set- up for UB ADLs at sink with LUE. Pt continues to be flat during sessions however communicating intelligible some with therapists via "yes" and appropriate gestures. Hopeful for CIR admit later today? Will follow acutely for OT needs.    Follow Up Recommendations  CIR;Supervision/Assistance - 24 hour    Equipment Recommendations  Other (comment)(TBD)    Recommendations for Other Services      Precautions / Restrictions Precautions Precautions: Fall Precaution Comments: R hemi Restrictions Weight Bearing Restrictions: No       Mobility Bed Mobility Overal bed mobility: Needs Assistance Bed Mobility: Supine to Sit     Supine to sit: HOB elevated;Mod assist;+2 for physical assistance     General bed mobility comments: pt with better initiation this session, pt requires assist to advance RLE to EOB and reach for rail with RUE. use of bed pad to scoot hips to EOB  Transfers Overall transfer level: Needs assistance Equipment used: Ambulation equipment used Transfers: Sit to/from Stand Sit to Stand: +2 physical assistance;Mod assist;Max  assist         General transfer comment: pt initially required MOD A for sit<>stand from EOB, however pt required MAX A from low BSC to power into standing. Pt with R anterior lean during session needing verbal and tactile cues to stand upright    Balance Overall balance assessment: Needs assistance Sitting-balance support: Feet supported;Single extremity supported Sitting balance-Leahy Scale: Fair Sitting balance - Comments: MIN A- min guard for sitting balance   Standing balance support: Bilateral upper extremity supported Standing balance-Leahy Scale: Poor Standing balance comment: relies on external support in stedy                            ADL either performed or assessed with clinical judgement   ADL Overall ADL's : Needs assistance/impaired     Grooming: Wash/dry face;Sitting Grooming Details (indicate cue type and reason): with LUE sitting on stedy at sink                 Toilet Transfer: Maximal assistance;+2 for physical assistance;+2 for safety/equipment;Cueing for safety;Moderate assistance Toilet Transfer Details (indicate cue type and reason): +2 mod- max assist for sit<>stand with stedy to [place] Manipulation and Hygiene: Total assistance;+2 for physical assistance;Sit to/from stand;Bed level Toileting - Clothing Manipulation Details (indicate cue type and reason): total A +2 to attempt pericare in standing however pt unable to tolerate extensive standing d/t R anterior lean; would likely needing total A from bed level     Functional mobility during ADLs: Maximal assistance;+2 for physical assistance;+2 for safety/equipment;Cueing for safety;Cueing for sequencing;Moderate assistance General ADL Comments: pt  continues to present with decreased activity tolerance, R sided weakness and impaired functional mobility impacting pts ability to engage in BADLs.     Vision   Additional Comments: pt able to track to R with cues    Perception     Praxis      Cognition Arousal/Alertness: Awake/alert Behavior During Therapy: Flat affect Overall Cognitive Status: Difficult to assess                                 General Comments: pt continues to be flat during sessions presenting with eyes closed often, however pt gesturing appropriately to bathroom when needing to void bowels        Exercises Other Exercises Other Exercises: light PROM to RUE in supine; noted slight tone in R elbow   Shoulder Instructions       General Comments pt able to void bowels on BSC during session; pt noted to be diaphoretic during session; BP taken 121/72 (87) HR 83 - RN aware    Pertinent Vitals/ Pain       Pain Assessment: Faces Faces Pain Scale: No hurt  Home Living                                          Prior Functioning/Environment              Frequency  Min 2X/week        Progress Toward Goals  OT Goals(current goals can now be found in the care plan section)  Progress towards OT goals: Progressing toward goals  Acute Rehab OT Goals Patient Stated Goal: pt unable to state OT Goal Formulation: Patient unable to participate in goal setting Time For Goal Achievement: 01/11/20 Potential to Achieve Goals: [place] Discharge plan remains appropriate;Frequency remains appropriate    Co-evaluation    PT/OT/SLP Co-Evaluation/Treatment: Yes Reason for Co-Treatment: For patient/therapist safety;To address functional/ADL transfers;Complexity of the patient's impairments (multi-system involvement)   OT goals addressed during session: ADL's and self-care      AM-PAC OT "6 Clicks" Daily Activity     Outcome Measure   Help from another Jerg[place] eating meals?: Total Help from another [place] taking care of personal grooming?: Total Help from another [place] toileting, which includes using toliet, bedpan, or urinal?: Total Help from another [place] bathing (including washing,  rinsing, drying)?: Total Help from another H[place] to put on and taking off regular upper body clothing?: Total Help from another [place] to put on and taking off regular lower body clothing?: Total 6 Click Score: 6    End of Session Equipment Utilized During Treatment: Gait belt;Other (comment)(stedy; BSC)  OT Visit Diagnosis: Other abnormalities of gait and mobility (R26.89);Muscle weakness (generalized) (M62.81);Hemiplegia and hemiparesis Hemiplegia - Right/Left: Right Hemiplegia - dominant/non-dominant: Dominant Hemiplegia - caused by: Cerebral infarction   Activity Tolerance Patient tolerated treatment well   Patient Left in chair;with call bell/phone within reach;with chair alarm set;Other (comment)(on lift pad)   Nurse Communication Mobility status;Need for lift equipment;Other (comment)(needs new purewick; BM on toilet; split liquid in bed)        Time: [id number] OT Time Calculation (min): 34 min  Charges: OT General Charges $OT Visit: 1 Visit OT Treatments $Self Care/Home Management : 8-22 mins  Lanier Clam., COTA/L Acute Rehabilitation Services [id number]    [facility]  K Ziquan Fidel 01/05/2020, 10:18 AM

## 2020-01-05 NOTE — Progress Notes (Signed)
Robin Heys, MD  Physician  Physical Medicine and Rehabilitation  Consult Note     Signed  Date of Service:  12/29/2019  8:38 AM      Related encounter: ED to Hosp-Admission (Discharged) from 12/26/2019 in [facility]      Signed      Expand AllCollapse All            Physical Medicine and Rehabilitation Consult Reason for Consult: Right side weakness Referring Physician: Dr. Leonie Man     HPI: Robin [name] is a 54 y.o. right-handed female with history of diastolic congestive heart failure, hypertension, atrial fibrillation on Eliquis, COPD, polysubstance abuse, diabetes mellitus and tobacco abuse.  Presented 12/27/2019 [facility] with acute onset of right-sided weakness and altered mental status as well as aphasia.  Cranial CT scan showed question evolving hypodensity involving the left basal ganglia left insula and adjacent left temporal lobe.  CT perfusion scan as well as CTA head and neck showed a 24 cc acute infarct deep and subcortical white matter of the anterior left frontal region.  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  Patient did not receive TPA.  Transferred to [facility].  MRI evolving left MCA territory infarct with hemorrhagic transformation.  Mass-effect with 6 mm rightward midline shift.  Admission chemistries urine drug screen positive cocaine, glucose 355, creatinine 1.15, troponin negative, BNP 367, alcohol negative.  Echocardiogram with ejection fraction of 35% with global hypokinesis.  Underwent mechanical thrombectomy per interventional radiology and required intubation.  Patient was extubated 12/28/2019.  Aspirin initiated for CVA prophylaxis.  Subcutaneous heparin for DVT prophylaxis.  Currently n.p.o. with alternative means of nutritional support.  Therapy evaluations completed with recommendations of physical medicine rehab consult.   Sister [name] and Daughter Dee-Dee by bedsideMichela Pitcher pt had a large BM  Tuesday night- -pt denied pain with nodding her head yes/no-      Review of Systems  Unable to perform ROS: Patient nonverbal        Past Medical History:  Diagnosis Date  . Allergy    . Anemia    . Arthritis    . CHF (congestive heart failure) ([place])      a. EF at 30-35% by echo in 08/2018 b. EF at 35% by repeat echo in 05/2019  . Chronic abdominal pain    . Cocaine abuse ([place])    . COPD (chronic obstructive pulmonary disease) ([place])    . Essential hypertension, benign    . Fatty liver    . GERD (gastroesophageal reflux disease)    . Gout 2016  . Normal coronary arteries      3/10 - following abnormal Myoview  . Ovarian cyst    . Type 2 diabetes mellitus ([place])           Past Surgical History:  Procedure Laterality Date  . ABDOMINAL HYSTERECTOMY   09/10/2011    Procedure: HYSTERECTOMY ABDOMINAL;  Surgeon: Jonnie Kind, MD;  Location: [facility];  Service: Gynecology;  Laterality: N/A;  Abdominal hysterectomy  . CESAREAN SECTION   [id number], and 1994  . CHOLECYSTECTOMY   1995  . IR CT HEAD LTD   12/27/2019  . IR PERCUTANEOUS ART THROMBECTOMY/INFUSION INTRACRANIAL INC DIAG ANGIO   12/27/2019  . IR US GUIDE VASC ACCESS RIGHT   12/27/2019  . RADIOLOGY WITH ANESTHESIA N/A 12/27/2019    Procedure: IR WITH ANESTHESIA;  Surgeon: Luanne Bras, MD;  Location: [facility];  Service: Radiology;  Laterality: N/A;  . SCAR REVISION   09/10/2011    Procedure: SCAR REVISION;  Surgeon: Jonnie Kind, MD;  Location: [facility];  Service: Gynecology;  Laterality: N/A;  Wide Excision of old Cicatrix  . TUBAL LIGATION   1994         Family History  Problem Relation Age of Onset  . Cirrhosis Mother    . Early death Mother    . Alcohol abuse Mother    . Diabetes type II Father    . Alcohol abuse Father    . Diabetes type II Sister    . Early death Brother    . Alcohol abuse Son    . Anesthesia problems Neg Hx    . Hypotension Neg Hx    . Malignant hyperthermia Neg Hx    . Pseudochol deficiency Neg  Hx    . Colon cancer Neg Hx    . Colon polyps Neg Hx    . Esophageal cancer Neg Hx    . Rectal cancer Neg Hx    . Stomach cancer Neg Hx      Social History:  reports that she has quit smoking. Her smoking use included cigarettes. She has a 7.25 pack-year smoking history. She has never used smokeless tobacco. She reports current alcohol use. She reports previous drug use. Drugs: Cocaine and Marijuana. Allergies:       Allergies  Allergen Reactions  . Bee Venom Shortness Of Breath and Swelling      Bodily Swelling  . Losartan Other (See Comments)      Nosebleeds per patient report.   . Naproxen Other (See Comments)      Effects acid reflux  . Penicillins Nausea Only      Has patient had a PCN reaction causing immediate rash, facial/tongue/throat swelling, SOB or lightheadedness with hypotension: no Has patient had a PCN reaction causing severe rash involving mucus membranes or skin necrosis: no Has patient had a PCN reaction that required hospitalization no Has patient had a PCN reaction occurring within the last 10 years: no If all of the above answers are "NO", then may proceed with Cephalosporin   . Lisinopril        Sinus congestion          Medications Prior to Admission  Medication Sig Dispense Refill  . acetaminophen (TYLENOL) 325 MG tablet Take 2 tablets (650 mg total) by mouth every 6 (six) hours as needed for mild pain, fever or headache (or Fever >/= 101). 12 tablet 0  . albuterol (PROVENTIL) (2.5 MG/3ML) 0.083% nebulizer solution Take 3 mLs (2.5 mg total) by nebulization every 6 (six) hours as needed for wheezing or shortness of breath. 75 mL 5  . albuterol (VENTOLIN HFA) 108 (90 Base) MCG/ACT inhaler Inhale 2 puffs into the lungs every 6 (six) hours as needed for wheezing or shortness of breath. 18 g 5  . apixaban (ELIQUIS) 5 MG TABS tablet Take 1 tablet (5 mg total) by mouth 2 (two) times daily. 60 tablet 5  . carvedilol (COREG) 25 MG tablet Take 2 tablets (50 mg total)  by mouth 2 (two) times daily with a meal. 120 tablet 6  . cephALEXin (KEFLEX) 500 MG capsule Take 1 capsule (500 mg total) by mouth 3 (three) times daily. 15 capsule 0  . digoxin (LANOXIN) 0.125 MG tablet Take 1 tablet (0.125 mg total) by mouth daily. 30 tablet 5  . Dulaglutide (TRULICITY) [id number] [id number]  SOPN Inject 0.75 mg into the skin once a week. 4 pen 3  . gabapentin (NEURONTIN) 100 MG capsule Take 1 capsule (100 mg total) by mouth 3 (three) times daily. 90 capsule 5  . glucose blood (CONTOUR NEXT TEST) test strip 2x daily 100 each 12  . hydrALAZINE (APRESOLINE) 50 MG tablet Take 1 tablet (50 mg total) by mouth 3 (three) times daily. 90 tablet 5  . insulin aspart protamine - aspart (NOVOLOG MIX 70/30 FLEXPEN) (70-30) 100 UNIT/ML FlexPen Inject 0.3 mLs (30 Units total) into the skin 2 (two) times daily. (Patient taking differently: Inject into the skin 2 (two) times daily. 20 units in am and 30 in the pm) 30 mL 6  . Insulin Pen Needle 32G X 4 MM MISC 1 Device by Does not apply route as directed. 100 each 6  . metFORMIN (GLUCOPHAGE) 1000 MG tablet Take 1 tablet (1,000 mg total) by mouth daily with breakfast. 90 tablet 3  . pantoprazole (PROTONIX) 40 MG tablet Take 1 tablet (40 mg total) by mouth daily. 30 tablet 2  . potassium chloride SA (KLOR-CON) 20 MEQ tablet Take 1 tablet (20 mEq total) by mouth 2 (two) times daily. 60 tablet 3  . tizanidine (ZANAFLEX) 2 MG capsule Take 1 capsule (2 mg total) by mouth 2 (two) times daily as needed for muscle spasms. 15 capsule 0  . torsemide (DEMADEX) 20 MG tablet Take 1 tablet (20 mg total) by mouth 2 (two) times daily. 60 tablet 5  . triamcinolone cream (KENALOG) 0.1 % Apply to affected area one to times daily 45 g 0      Home: Home Living Family/patient expects to be discharged to:: Private residence Additional Comments: per chart and RN report, sister has been present and supportive. Will need to confirm home setup/PLOF  Functional History: Prior  Function Comments: suspect pt independent PTA, will need to confirm  Functional Status:  Mobility: Bed Mobility Overal bed mobility: Needs Assistance Bed Mobility: Rolling, Sidelying to Sit, Sit to Supine Rolling: Max assist, +2 for physical assistance Sidelying to sit: Total assist, HOB elevated, +2 for physical assistance Supine to sit: Total assist, +2 for physical assistance Sit to supine: Total assist, +2 for physical assistance General bed mobility comments: transitioning towards pt's R side. requires assist for LEs/trunk throughout transitions. Pt only able to tolerate sitting EOB for short period of time, with increased secretions, diaphoresis and restless/appearing uncomfortable while upright Transfers General transfer comment: deferred    ADL: ADL Overall ADL's : Needs assistance/impaired General ADL Comments: curently totalA   Cognition: Cognition Overall Cognitive Status: Difficult to assess Orientation Level: Intubated/Tracheostomy - Unable to assess Cognition Arousal/Alertness: Lethargic Behavior During Therapy: Flat affect Overall Cognitive Status: Difficult to assess General Comments: pt with almost no command follow, will initially open eyes with certain movements and becoming more awake/alert once upright, squeezing therapist's hand while seated EOB but unsure if pt following instruction or just reflex Difficult to assess due to: Intubated   Blood pressure (!) 162/110, pulse (!) 113, temperature 99.1 F (37.3 C), temperature source Oral, resp. rate 14, height 5\' 6"  (1.676 m), weight 100.8 kg, last menstrual period 08/21/2011, SpO2 100 %. Physical Exam  Nursing note and vitals reviewed. Constitutional: She appears well-developed and well-nourished.  Pt is younger appearing woman- sitting in recliner- daughter and sister at bedside; just was extubated 4 hours ago or so; on 4L O2; nonverbal but nodding head SOME yes and no- but kept falling asleep Vitals 135/85  Pulse 103; 99% were on monitor  HENT:  Head: Normocephalic and atraumatic.  Very sleepy- kept closing her eyes- did not see facial asymmetry on exam- wouldn't smile for me or stick out tongue-  cortrak in place- O2 by [place] 4L  Eyes:  Was able to look left, but didn't appear to follow me past midline to go right- no nystagmus seen grossly.   Neck: No tracheal deviation present.  Cardiovascular:  In atrial fibrillation; rate low 100s on exam so slightly tachycardic- no M/R/G heard  Respiratory: No stridor.  Somewhat coarse, like needs to cough- a little rattle with expiration- but good air movement- wasn't increasing breath sounds with exam, of note  GI:  Soft, protuberant; NT; ND?; hypoactive BS  Genitourinary:    Genitourinary Comments: purewick in place   Musculoskeletal:     Cervical back: Neck supple.     Comments: Wiggling L foot and would grab my hand/fingers with L hand- was strong, but couldn't get her to do more muscle testing- when tickled her, no movement seen on R side at all, but not sure if sensation on R is intact  Neurological:  Don't think sensation on R is intact- didn't move LEFT side when tickled on R or did mild noxious stimuli on R Kept closing eyes- hard to get her to keep open- very sedated/sleepy Sensation intact on L side- it appears- is ticklish on L side  Skin: Skin is warm and dry.  Hair up in braids and bun on top of head Midline R upper arm- looks good; R forearm IV-looks good; L forearm IV looks good.  Psychiatric:  Sleepy/sedated      Lab Results Last 24 Hours       Results for orders placed or performed during the hospital encounter of 12/26/19 (from the past 24 hour(s))  Sodium     Status: None    Collection Time: 12/28/19  8:43 AM  Result Value Ref Range    Sodium 142 135 - 145 mmol/L  Glucose, capillary     Status: Abnormal    Collection Time: 12/28/19 11:10 AM  Result Value Ref Range    Glucose-Capillary 253 (H) 70 - 99 mg/dL  Glucose,  capillary     Status: Abnormal    Collection Time: 12/28/19  3:29 PM  Result Value Ref Range    Glucose-Capillary 209 (H) 70 - 99 mg/dL  Magnesium     Status: None    Collection Time: 12/28/19  6:55 PM  Result Value Ref Range    Magnesium 2.1 1.7 - 2.4 mg/dL  Phosphorus     Status: None    Collection Time: 12/28/19  6:55 PM  Result Value Ref Range    Phosphorus 2.5 2.5 - 4.6 mg/dL  Sodium     Status: Abnormal    Collection Time: 12/28/19  6:55 PM  Result Value Ref Range    Sodium 148 (H) 135 - 145 mmol/L  Glucose, capillary     Status: Abnormal    Collection Time: 12/28/19  7:33 PM  Result Value Ref Range    Glucose-Capillary 187 (H) 70 - 99 mg/dL  Glucose, capillary     Status: Abnormal    Collection Time: 12/28/19 11:21 PM  Result Value Ref Range    Glucose-Capillary 261 (H) 70 - 99 mg/dL  Sodium     Status: Abnormal    Collection Time: 12/29/19  1:20 AM  Result Value Ref Range    Sodium 151 (H) 135 -  145 mmol/L  Glucose, capillary     Status: Abnormal    Collection Time: 12/29/19  3:37 AM  Result Value Ref Range    Glucose-Capillary 256 (H) 70 - 99 mg/dL  CBC     Status: None    Collection Time: 12/29/19  4:40 AM  Result Value Ref Range    WBC 9.3 4.0 - 10.5 K/uL    RBC 4.14 3.87 - 5.11 MIL/uL    Hemoglobin 12.2 12.0 - 15.0 g/dL    HCT 39.8 36.0 - 46.0 %    MCV 96.1 80.0 - 100.0 fL    MCH 29.5 26.0 - 34.0 pg    MCHC 30.7 30.0 - 36.0 g/dL    RDW 14.6 11.5 - 15.5 %    Platelets 193 150 - 400 K/uL    nRBC 0.0 0.0 - 0.2 %  Basic metabolic panel     Status: Abnormal    Collection Time: 12/29/19  4:40 AM  Result Value Ref Range    Sodium 153 (H) 135 - 145 mmol/L    Potassium 3.6 3.5 - 5.1 mmol/L    Chloride 118 (H) 98 - 111 mmol/L    CO2 24 22 - 32 mmol/L    Glucose, Bld 267 (H) 70 - 99 mg/dL    BUN 17 6 - 20 mg/dL    Creatinine, Ser 0.87 0.44 - 1.00 mg/dL    Calcium 8.5 (L) 8.9 - 10.3 mg/dL    GFR calc non Af Amer >60 >60 mL/min    GFR calc Af Amer >60 >60  mL/min    Anion gap 11 5 - 15  Sodium     Status: Abnormal    Collection Time: 12/29/19  6:30 AM  Result Value Ref Range    Sodium 154 (H) 135 - 145 mmol/L       Imaging Results (Last 48 hours)  CT HEAD WO CONTRAST   Result Date: 12/29/2019 CLINICAL DATA:  Stroke follow-up EXAM: CT HEAD WITHOUT CONTRAST TECHNIQUE: Contiguous axial images were obtained from the base of the skull through the vertex without intravenous contrast. COMPARISON:  12/28/2019 FINDINGS: Brain: Large anterior left MCA territory infarct with associated cytotoxic edema and petechial hemorrhage. There is 8 mm of rightward midline shift, unchanged. Mass effect on the lateral ventricles is unchanged. No hydrocephalus or ventricular entrapment. Vascular: No abnormal hyperdensity of the major intracranial arteries or dural venous sinuses. No intracranial atherosclerosis. Skull: The visualized skull base, calvarium and extracranial soft tissues are normal. Sinuses/Orbits: No fluid levels or advanced mucosal thickening of the visualized paranasal sinuses. No mastoid or middle ear effusion. The orbits are normal. IMPRESSION: 1. Unchanged appearance of large anterior left MCA territory infarct with petechial hemorrhage. 2. Unchanged 8 mm of rightward midline shift. Electronically Signed   By: Ulyses Jarred M.D.   On: 12/29/2019 03:44    CT Head Wo Contrast   Result Date: 12/28/2019 CLINICAL DATA:  Stroke follow-up EXAM: CT HEAD WITHOUT CONTRAST TECHNIQUE: Contiguous axial images were obtained from the base of the skull through the vertex without intravenous contrast. COMPARISON:  Brain MRI from yesterday FINDINGS: Brain: Cytotoxic edema in the anterior left MCA division and basal ganglia. There is petechial hemorrhage better seen by prior MRI. No discrete hematoma. Swelling causes 7 mm of midline shift. No entrapment. Vascular: Dense appearance of the left MCA which may be related to adjacent edematous brain. Skull: Negative  Sinuses/Orbits: Negative IMPRESSION: Acute left MCA territory infarct with petechial hemorrhage. No evident progression  from brain MRI yesterday. Midline shift measures 7 mm. Electronically Signed   By: Monte Fantasia M.D.   On: 12/28/2019 04:29    MR BRAIN WO CONTRAST   Result Date: 12/27/2019 CLINICAL DATA:  Stroke follow-up; status post left MCA thrombectomy EXAM: MRI HEAD WITHOUT CONTRAST TECHNIQUE: Multiplanar, multiecho pulse sequences of the brain and surrounding structures were obtained without intravenous contrast. COMPARISON:  Head CT Dec 26, 2019 FINDINGS: Brain: Large area of restricted diffusion involving the left basal ganglia, insula, anterior temporal lobe including amygdala, inferior frontal lobe with cortical restricted diffusion in the lateral frontal and parietal regions, consistent with evolving left MCA territory infarct. There is susceptibility artifact along the cortex and basal ganglia, consistent with hemorrhagic transformation. There is mass effect with effacement of the regional sulci and partial effacement of the left lateral ventricle with a 6 mm rightward midline shift. There is no hydrocephalus. Remainder of the brain parenchyma is maintained. Vascular: Normal flow voids noting a 3 mm left MCA bifurcation aneurysm and a 7 mm paraclinoid right ICA aneurysm. Skull and upper cervical spine: Normal marrow signal. Sinuses/Orbits: Mild mucosal thickening of the ethmoid cells. The orbits are maintained. Other: Endotracheal tube noted. IMPRESSION: 1. Evolving left MCA territory infarct with hemorrhagic transformation. Mass effect with 6 mm rightward midline shift. 2. Normal flow voids noting a 3 mm left MCA bifurcation aneurysm and a 7 mm paraclinoid right ICA aneurysm. Electronically Signed   By: Pedro Earls M.D.   On: 12/27/2019 15:29    ECHOCARDIOGRAM COMPLETE   Result Date: 12/27/2019    ECHOCARDIOGRAM REPORT   Patient Name:   [facility] Ma[facility] Date of Exam:  12/27/2019 Medical Rec #:  [id number]      Height:       66.0 in Accession #:    [id number]     Weight:       218.0 lb Date of Birth:  28-Feb-1966     BSA:          2.075 m Patient Age:    58 years       BP:           113/65 mmHg Patient Gender: F              HR:           60 bpm. Exam Location:  Inpatient Procedure: 2D Echo, Cardiac Doppler and Color Doppler Indications:    Stroke  History:        Patient has prior history of Echocardiogram examinations, most                 recent 05/11/2019. CHF, COPD, Arrythmias:Atrial Fibrillation;                 Risk Factors:Hypertension and Diabetes. Nicotine abuse,Substance                 abuse.  Sonographer:    Alvino Chapel RCS Referring Phys: [phone] ERIC LINDZEN  Sonographer Comments: Patient on mechanical ventilator during echo exam IMPRESSIONS  1. Left ventricular ejection fraction, by estimation, is 30 to 35%. The left ventricle has moderately decreased function. The left ventricle demonstrates global hypokinesis. The left ventricular internal cavity size was mildly to moderately dilated. There is moderate asymmetric left ventricular hypertrophy. Left ventricular diastolic function could not be evaluated.  2. Right ventricular systolic function is normal. The right ventricular size is normal.  3. Left atrial size was severely dilated.  4. Right atrial size  was severely dilated.  5. The mitral valve is grossly normal. Trivial mitral valve regurgitation. No evidence of mitral stenosis.  6. The aortic valve is grossly normal. Aortic valve regurgitation is not visualized. Moderate aortic valve stenosis. FINDINGS  Left Ventricle: Left ventricular ejection fraction, by estimation, is 30 to 35%. The left ventricle has moderately decreased function. The left ventricle demonstrates global hypokinesis. The left ventricular internal cavity size was mildly to moderately  dilated. There is moderate asymmetric left ventricular hypertrophy. Left ventricular diastolic function could not  be evaluated due to atrial fibrillation. Left ventricular diastolic function could not be evaluated. Right Ventricle: The right ventricular size is normal. No increase in right ventricular wall thickness. Right ventricular systolic function is normal. Left Atrium: Left atrial size was severely dilated. Right Atrium: Right atrial size was severely dilated. Pericardium: There is no evidence of pericardial effusion. Mitral Valve: The mitral valve is grossly normal. Trivial mitral valve regurgitation. No evidence of mitral valve stenosis. Tricuspid Valve: The tricuspid valve is normal in structure. Tricuspid valve regurgitation is trivial. No evidence of tricuspid stenosis. Aortic Valve: The aortic valve is grossly normal. Aortic valve regurgitation is not visualized. Moderate aortic stenosis is present. Aortic valve mean gradient measures 12.0 mmHg. Aortic valve peak gradient measures 19.1 mmHg. Aortic valve area, by VTI measures 1.20 cm. Pulmonic Valve: The pulmonic valve was normal in structure. Pulmonic valve regurgitation is not visualized. No evidence of pulmonic stenosis. Aorta: The aortic root and ascending aorta are structurally normal, with no evidence of dilitation. IAS/Shunts: The atrial septum is grossly normal.  LEFT VENTRICLE PLAX 2D LVIDd:         4.20 cm LVIDs:         3.20 cm LV PW:         1.30 cm LV IVS:        1.50 cm LVOT diam:     2.00 cm LV SV:         57 LV SV Index:   28 LVOT Area:     3.14 cm  LV Volumes (MOD) LV vol d, MOD A2C: 68.2 ml LV vol d, MOD A4C: 100.0 ml LV vol s, MOD A2C: 26.9 ml LV vol s, MOD A4C: 42.7 ml LV SV MOD A2C:     41.3 ml LV SV MOD A4C:     100.0 ml LV SV MOD BP:      50.8 ml RIGHT VENTRICLE RV S prime:     7.18 cm/s TAPSE (M-mode): 1.9 cm LEFT ATRIUM              Index LA diam:        3.50 cm  1.69 cm/m LA Vol (A2C):   113.0 ml 54.47 ml/m LA Vol (A4C):   133.0 ml 64.11 ml/m LA Biplane Vol: 123.0 ml 59.29 ml/m  AORTIC VALVE AV Area (Vmax):    1.19 cm AV Area  (Vmean):   1.27 cm AV Area (VTI):     1.20 cm AV Vmax:           218.50 cm/s AV Vmean:          158.000 cm/s AV VTI:            0.481 m AV Peak Grad:      19.1 mmHg AV Mean Grad:      12.0 mmHg LVOT Vmax:         82.50 cm/s LVOT Vmean:        64.100 cm/s LVOT  VTI:          0.183 m LVOT/AV VTI ratio: 0.38  AORTA Ao Root diam: 3.20 cm MITRAL VALVE MV Area (PHT): 3.53 cm     SHUNTS MV Decel Time: 215 msec     Systemic VTI:  0.18 m MV E velocity: 133.00 cm/s  Systemic Diam: 2.00 cm Mertie Moores MD Electronically signed by Mertie Moores MD Signature Date/Time: 12/27/2019/2:34:44 PM    Final        Assessment/Plan: Diagnosis: L MCA occlusion s/p thrombectomy with R hemiplegia, dysphagia and likely some aphasia 1. Does the need for close, 24 hr/day medical supervision in concert with the patient's rehab needs make it unreasonable for this patient to be served in a less intensive setting? Yes 2. Co-Morbidities requiring supervision/potential complications: DM with [id number] of 11.6, dysphagia- with cortrak, gout, fatty liver, HTN, HLD, substance abuse 3. Due to bladder management, bowel management, safety, skin/wound care, disease management, medication administration, pain management and patient education, does the patient require 24 hr/day rehab nursing? Yes 4. Does the patient require coordinated care of a physician, rehab nurse, therapy disciplines of PT, OT, and SLP to address physical and functional deficits in the context of the above medical diagnosis(es)? Yes Addressing deficits in the following areas: balance, endurance, locomotion, strength, transferring, bowel/bladder control, bathing, dressing, feeding, grooming, toileting, cognition, speech, language, swallowing and psychosocial support 5. Can the patient actively participate in an intensive therapy program of at least 3 hrs of therapy per day at least 5 days per week? Yes 6. The potential for patient to make measurable gains while on inpatient rehab  is good and fair 7. Anticipated functional outcomes upon discharge from inpatient rehab are min assist  with PT, min assist with OT, min assist with SLP. 8. Estimated rehab length of stay to reach the above functional goals is: 20-28 days 9. Anticipated discharge destination: Home 10. Overall Rehab/Functional Prognosis: good and fair   RECOMMENDATIONS: This patient's condition is appropriate for continued rehabilitative care in the following setting: CIR Patient has agreed to participate in recommended program. Potentially Note that insurance prior authorization may be required for reimbursement for recommended care.   Comment: 1'Pt s/p L MCA occlusion- just extubated a few hours ago- obviously not ready yet for CIR_ likely early-mid next week.  2. Suggest something to help wake pt up- either Amantadine or Ritalin- likely Amantadine since has Afib/faster rate currently- would do 100 mg daily x 3-4 days then 200 mg daily- for initiation and wakefulness.  3. Monitor bowels- LBM Tuesday- that I could see- might need to add PO/Cortrak bowel meds 4. Suggest prevalon boots to be on RLE when in bed- to prevent skin breakdown and slow PF contracture.  5. Of note, her family admitted she took her meds daily, but "diet was not ideal"- still "ate what she wanted".  6. Will con't to monitor remotely 7. Thank you for this consult.        Lavon Paganini Angiulli, PA-C 12/29/2019      I have personally performed a face to face diagnostic evaluation of this patient and formulated the key components of the plan.  Additionally, I have personally reviewed laboratory data, imaging studies, as well as relevant notes and concur with the physician assistant's documentation above.            Revision History                     Routing History

## 2020-01-05 NOTE — Progress Notes (Signed)
Robin Arn, MD  Physician  Physical Medicine and Rehabilitation  PMR Pre-admission     Addendum  Date of Service:  12/31/2019 12:00 PM      Related encounter: ED to Hosp-Admission (Discharged) from 12/26/2019 in [name]        PMR Admission Coordinator Pre-Admission Assessment   Patient: Robin [name] is an 54 y.o., female MRN: [id number] DOB: Jan 05, 1966 Height: '5\' 6"'$  (167.6 cm) Weight: 98 kg                                                                                                                                                  Insurance Information HMO:     PPO: yes     PCP:      IPA:      80/20:      OTHER:  PRIMARY: Robin [name]      Policy#: [id number]      Subscriber: patient CM Name: Faxed approval from Utilization Management Department (confirmedCIR approval with Robin [name] at [phone])      Phone#: [phone]    Fax#: [phone] Pre-Cert#: case originally pended for review under reference # [id number] (however auth provided under different number ([id number]). Per Robin [name] at [facility], the original case number [id number] was entered in error and voided and a new case number was created with new Auth for Robin [name] at [name]      Employer:  Auth provided by faxed approval from Utilization Management Department and was confirmed via phone call with Robin [name] at [name] at [phone]. Pt is approved for [name] under auth [id number] for 7 days with start date 01/05/20 and end date 01/11/20. Providers to call [phone] for questions regarding authorization.  Benefits:  Phone #: [phone], option 8     Name: Robin [name]. Date: 08/06/19-01/23/20   Deduct: $0 does not have      Out of Pocket Max: $1,500 ($706.27 met)      Life Max: NA  Robin [name]: 80% coverage, 20% co-insurance up til OOP Max      SNF: 80% coverage, 20% co-insurance: limited to 60 days per cal yr Outpatient: $30 co-pay per visit; 30 per calendar year combined therapies     Home  Health: 80% coverage, 20% co-insurance DME: 80% coverage, 20% co-insurance     Providers:  SECONDARY: None      Policy#:       Phone#:    Development worker, community:       Phone#:    The Engineer, petroleum" for patients in Inpatient Rehabilitation Facilities with attached "Privacy Act Nectar [name]" was provided and verbally reviewed with: N/A   Emergency Contact Information         Contact Information     Name Relation Home Work Mobile    Robin [name] Sister  [phone]       Current Medical History  Patient Admitting Diagnosis:  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  Presented 12/27/2019 to [name] with acute onset of right side weakness and altered mental status as well as aphasia.  Cranial CT scan showed question evolving hypodensity involving the left basal ganglia left insula and adjacent left temporal lobe.  CT perfusion scan as well as CTA of head and neck showed a 24 cc acute infarct deep and subcortical white matter of the anterior left frontal region.  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  Patient did not receive TPA.  Patient was transferred to [name].  MRI showed evolving left MCA territory infarction with hemorrhagic transformation.  Mass-effect with 6 mm rightward midline shift.  Admission chemistries urine drug screen positive cocaine, glucose 355, creatinine 1.15, troponin negative, BNP 367, alcohol negative.  Echocardiogram with ejection fraction of 35% with global hypokinesis.  Patient underwent mechanical thrombectomy per interventional radiology required intubation and was extubated 12/28/2019.  Initially maintained on low-dose  aspirin and transitioned back to Eliquis.  Dysphagia #1 pudding thick liquid diet with nasogastric tube for nutritional support.  Therapy evaluations completed and patient is to be admitted for a comprehensive rehab program on 01/05/20.   Complete NIHSS TOTAL: 19 Glasgow Coma Scale Score: 13   Past Medical History      Past Medical History:  Diagnosis Date  . Allergy    . Anemia    . Arthritis    . CHF (congestive heart failure) ([place])      a. EF at 30-35% by echo in 08/2018 b. EF at 35% by repeat echo in 05/2019  . Chronic abdominal pain    . Cocaine abuse ([place])    . COPD (chronic obstructive pulmonary disease) ([place])    . Essential hypertension, benign    . Fatty liver    . GERD (gastroesophageal reflux disease)    . Gout 2016  . Normal coronary arteries      3/10 - following abnormal Myoview  . Ovarian cyst    . Type 2 diabetes mellitus (HCC)        Family History  family history includes Alcohol abuse in her father, mother, and son; Cirrhosis in her mother; Diabetes type II in her father and sister; Early death in her brother and mother.   Prior Rehab/Hospitalizations:  Has the patient had prior rehab or hospitalizations prior to admission? Yes   Has the patient had major surgery during 100 days prior to admission? Yes   Current Medications    Current Facility-Administered Medications:  .   stroke: mapping our early stages of recovery book, , Does not apply, Once, Biby, Sharon L, NP .  0.9 %  sodium chloride infusion, , Intravenous, PRN, Miachel Roux, Sharon L, NP .  acetaminophen (TYLENOL) tablet 650 mg, 650 mg, Oral, Q4H PRN **OR** acetaminophen (TYLENOL) 160 MG/5ML solution 650 mg, 650 mg, Per Tube, Q4H PRN, 650 mg at 01/04/20 1427 **OR** acetaminophen (TYLENOL) suppository 650 mg, 650 mg, Rectal, Q4H PRN, Biby, Sharon L, NP .  apixaban (ELIQUIS) tablet 5 mg, 5 mg, Per Tube, BID, Pierce, Dwayne A, RPH, 5 mg at 01/05/20 0925 .  carvedilol (COREG) tablet  50 mg, 50 mg, Per Tube, BID  WC, Biby, Sharon L, NP, 50 mg at 01/05/20 0819 .  chlorhexidine (PERIDEX) 0.12 % solution 15 mL, 15 mL, Mouth Rinse, BID, Biby, Sharon L, NP, 15 mL at 01/05/20 0932 .  Chlorhexidine Gluconate Cloth 2 % PADS 6 each, 6 each, Topical, Daily, Donzetta Starch, NP, 6 each at 01/05/20 0932 .  digoxin (LANOXIN) tablet 0.125 mg, 0.125 mg, Per Tube, Daily, Biby, Sharon L, NP, 0.125 mg at 01/05/20 0926 .  feeding supplement (GLUCERNA 1.2 CAL) liquid 1,000 mL, 1,000 mL, Per Tube, Continuous, Biby, Sharon L, NP, Last Rate: 60 mL/hr at 01/04/20 2254, 1,000 mL at 01/04/20 2254 .  feeding supplement (PRO-STAT SUGAR FREE 64) liquid 30 mL, 30 mL, Per Tube, Daily, Biby, Sharon L, NP, 30 mL at 01/05/20 0925 .  free water 200 mL, 200 mL, Per Tube, Q6H, Biby, Sharon L, NP, 200 mL at 01/05/20 0649 .  gabapentin (NEURONTIN) 250 MG/5ML solution 100 mg, 100 mg, Per Tube, Q8H, Biby, Sharon L, NP, 100 mg at 01/05/20 0649 .  insulin aspart (novoLOG) injection 0-20 Units, 0-20 Units, Subcutaneous, Q4H, Biby, Sharon L, NP, 4 Units at 01/05/20 0819 .  insulin aspart (novoLOG) injection 2 Units, 2 Units, Subcutaneous, Q4H, Donzetta Starch, NP, 2 Units at 01/05/20 0819 .  insulin glargine (LANTUS) injection 60 Units, 60 Units, Subcutaneous, QHS, Biby, Massie Kluver, NP, 60 Units at 01/04/20 2149 .  ipratropium-albuterol (DUONEB) 0.5-2.5 (3) MG/3ML nebulizer solution 3 mL, 3 mL, Nebulization, Q4H PRN, Sethi, Pramod S, MD .  isosorbide-hydrALAZINE (BIDIL) 20-37.5 MG per tablet 2 tablet, 2 tablet, Per Tube, TID, Donzetta Starch, NP, 2 tablet at 01/05/20 0926 .  labetalol (NORMODYNE) injection 10-40 mg, 10-40 mg, Intravenous, Q10 min PRN, Donzetta Starch, NP, 40 mg at 12/29/19 0117 .  MEDLINE mouth rinse, 15 mL, Mouth Rinse, q12n4p, Biby, Sharon L, NP, 15 mL at 01/04/20 1704 .  metFORMIN (GLUCOPHAGE) tablet 500 mg, 500 mg, Oral, BID WC, Rosalin Hawking, MD, 500 mg at 01/05/20 0037 .  multivitamin with minerals tablet 1 tablet, 1 tablet, Per Tube,  Daily, Donzetta Starch, NP, 1 tablet at 01/05/20 0926 .  pantoprazole sodium (PROTONIX) 40 mg/20 mL oral suspension 40 mg, 40 mg, Per Tube, Daily, Biby, Sharon L, NP, 40 mg at 01/05/20 0926 .  potassium chloride (KLOR-CON) packet 40 mEq, 40 mEq, Per Tube, Daily, Rinehuls, David L, PA-C, 40 mEq at 01/05/20 0925 .  Resource ThickenUp Clear, , Oral, PRN, Donzetta Starch, NP .  rosuvastatin (CRESTOR) tablet 20 mg, 20 mg, Per Tube, Daily, Biby, Sharon L, NP, 20 mg at 01/05/20 0925 .  senna-docusate (Senokot-S) tablet 1 tablet, 1 tablet, Per Tube, QHS PRN, Donzetta Starch, NP, 1 tablet at 01/01/20 1629 .  sodium chloride flush (NS) 0.9 % injection 10-40 mL, 10-40 mL, Intracatheter, Q12H, Biby, Sharon L, NP, 10 mL at 01/05/20 0933 .  sodium chloride flush (NS) 0.9 % injection 10-40 mL, 10-40 mL, Intracatheter, PRN, Burnetta Sabin L, NP, 10 mL at 01/04/20 2150 .  tiZANidine (ZANAFLEX) tablet 2 mg, 2 mg, Per Tube, BID PRN, Donzetta Starch, NP, 2 mg at 01/01/20 2157 .  torsemide (DEMADEX) tablet 20 mg, 20 mg, Per Tube, Daily, Biby, Sharon L, NP, 20 mg at 01/05/20 0488   Patients Current Diet:     Diet Order  DIET - DYS 1 Room service appropriate? 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  Independent   Indoor Mobility: Did the patient need  assistance with walking from room to room (with or without device)? Independent   Stairs: Did the patient need assistance with internal or external stairs (with or without device)? Independent   Functional Cognition: Did the patient need help planning regular tasks such as shopping or remembering to take medications? Independent   Home Assistive Devices / Equipment Home Equipment: None   Prior Device Use: Indicate devices/aids used by the patient prior to current illness, exacerbation or injury? None of the above   Current Functional Level Cognition   Arousal/Alertness: Awake/alert Overall Cognitive Status: Difficult to assess Difficult to assess due to: Impaired communication Orientation Level: Oriented to [place] General Comments: pt continues to be flat during sessions presenting with eyes closed often, however pt gesturing appropriately to bathroom when needing to void bowels Attention: Sustained Sustained Attention: Appears intact Awareness: Impaired Awareness Impairment: Emergent impairment Problem Solving: Appears intact(functional, basic)    Extremity Assessment (includes Sensation/Coordination)   Upper Extremity Assessment: RUE deficits/detail RUE Deficits / Details: flaccid RUE, observed sponteous movement after wrist flexion/extension to hand  RUE Sensation: decreased proprioception RUE Coordination: decreased fine motor, decreased gross motor LUE Deficits / Details: observed sponteous movement  Lower Extremity Assessment: LLE deficits/detail RLE Deficits / Details: no active movement noted on R, maybe mild withdraw to pain, PROM generally WFL, still heel cord LLE Deficits / Details: moving in knee extension and back down on bed with contact and without for a short time. pt responds to tactile input with same movement pattern.      ADLs   Overall ADL's : Needs assistance/impaired Grooming: Wash/dry face, Sitting Grooming Details (indicate cue type and reason): with LUE  sitting on stedy at sink Toilet Transfer: Maximal assistance, +2 for physical assistance, +2 for safety/equipment, Cueing for safety, Moderate assistance Toilet Transfer Details (indicate cue type and reason): +2 mod- max assist for sit<>stand with stedy to [place]- Clothing Manipulation and Hygiene: Total assistance, +2 for physical assistance, Sit to/from stand, Bed level Toileting - Clothing Manipulation Details (indicate cue type and reason): total A +2 to attempt pericare in standing however pt unable to tolerate extensive standing d/t R anterior lean; would likely needing total A from bed level Functional mobility during ADLs: Maximal assistance, +2 for physical assistance, +2 for safety/equipment, Cueing for safety, Cueing for sequencing, Moderate assistance General ADL Comments: pt continues to present with decreased activity tolerance, R sided weakness and impaired functional mobility impacting pts ability to engage in BADLs.     Mobility   Overal bed mobility: Needs Assistance Bed Mobility: Supine to Sit Rolling: Mod assist, +2 for physical assistance Sidelying to sit:  Max assist, +2 for physical assistance Supine to sit: HOB elevated, Mod assist, +2 for physical assistance Sit to supine: Mod assist, +2 for physical assistance General bed mobility comments: pt with better initiation this session, pt requires assist to advance RLE to EOB and reach for rail with RUE. use of bed pad to scoot hips to EOB     Transfers   Overall transfer level: Needs assistance Equipment used: Ambulation equipment used Transfer via Lift Equipment: Stedy Transfers: Sit to/from Stand Sit to Stand: +2 physical assistance, Mod assist, Max assist Squat pivot transfers: Max assist, +2 physical assistance  Lateral/Scoot Transfers: Max assist, +2 physical assistance, +2 safety/equipment General transfer comment: pt initially required MOD A for sit<>stand from EOB, however pt required MAX A from low BSC to  power into standing. Pt with R anterior lean during session needing verbal and tactile cues to stand upright     Ambulation / Gait / Stairs / Wheelchair Mobility   Ambulation/Gait Ambulation/Gait assistance: Max assist, +2 physical assistance Gait Distance (Feet): 1 Feet Assistive device: 2 Koerner hand held assist(holding back of recliner) Gait Pattern/deviations: Step-to pattern, Shuffle, Wide base of support General Gait Details: scooting L foot up toward [name] and assist for weight shift and to move R foot over x 2-3 "steps"     Posture / Balance Dynamic Sitting Balance Sitting balance - Comments: MIN A- min guard for sitting balance Balance Overall balance assessment: Needs assistance Sitting-balance support: Feet supported, Single extremity supported Sitting balance-Leahy Scale: Fair Sitting balance - Comments: MIN A- min guard for sitting balance Postural control: Posterior lean, Right lateral lean Standing balance support: Bilateral upper extremity supported Standing balance-Leahy Scale: Poor Standing balance comment: relies on external support in stedy      Special needs/care consideration Continuous Drip IV: feeding supplement liquid 1,000 mL   Skin: serous blister to mid back   Diabetic management: yes    Bowel and bladder incontinence; last BM 01/03/20   Has Nasoenteric feeding tube cortrack   and Designated visitor: [name] (sister) and Freada Bergeron (daughter)        Previous Home Environment (from acute therapy documentation) Living Arrangements: Spouse/significant other(will likely go to son or daughter's home)  Lives With: Significant other Available Help at Discharge: Family, Available 24 hours/day Type of Home: House Home Layout: One level Home Access: Stairs to enter Entrance Stairs-Rails: Right Entrance Stairs-Number of Steps: 3-4 Bathroom Shower/Tub: Chiropodist: Standard Additional Comments: per chart and RN report, sister has been present  and supportive. Will need to confirm home setup/PLOF   Discharge Living Setting Plans for Discharge Living Setting: Other (Comment)(pt will stay with her daugther at [place] at her daugther's house) Type of Home at Discharge: House Discharge Home Layout: One level Discharge Home Access: Stairs to enter Entrance Stairs-Rails: None Entrance Stairs-Number of Steps: 3 Discharge Bathroom Shower/Tub: Tub/shower unit Discharge Bathroom Toilet: Standard Discharge Bathroom Accessibility: Yes How Accessible: Accessible via walker Does the patient have any problems obtaining your medications?: Yes (Describe)(pt's sister reports finances may be factor)   Social/Family/Support Systems Patient Roles: Other (Comment)(has grown children; works full time in SNF) Sport and exercise psychologist Information: sister ([name]): [phone]; daughter Freada Bergeron [phone]) Anticipated Caregiver: [name], Freada Bergeron, and the patient's two sons Anticipated Caregiver's Contact Information: see above Ability/Limitations of Caregiver: Min A Caregiver Availability: 24/7 Discharge Plan Discussed with Primary Caregiver: Yes(spoke with pt's sister and her daughter) Is Caregiver In Agreement with Plan?: Yes Does Caregiver/Family have Issues with Lodging/Transportation while Pt  is in Rehab?: No     Goals Patient/Family Goal for Rehab: PT/OT/SLP: Min A Expected length of stay: 24-28 days  Cultural Considerations: NA Pt/Family Agrees to Admission and willing to participate: Yes Program Orientation Provided & Reviewed with Pt/Caregiver Including Roles  & Responsibilities: Yes(pt and her daugther and sister)  Barriers to Discharge: Home environment access/layout, Nutrition means  Barriers to Discharge Comments: steps to enter home;  Cortrack     Decrease burden of Care through IP rehab admission: NA     Possible need for SNF placement upon discharge: Not anticipated; pt had good family support per sister and daughter and they are able to provide the  recommended physical assistance needed at DC (min A).      Patient Condition: This patient's medical and functional status has changed since the consult dated: 12/29/19 in which the Rehabilitation Physician determined and documented that the patient's condition is appropriate for intensive rehabilitative care in an inpatient rehabilitation facility. See "History of Present Illness" (above) for medical update. Functional changes are: improvement in bed mobility from total A to Mod A, and pt has progressed to OOB transfers requiring Mod/Max A +2. Patient's medical and functional status update has been discussed with the Rehabilitation physician and patient remains appropriate for inpatient rehabilitation. Will admit to inpatient rehab today.   Preadmission Screen Completed By:  Raechel Ache, OT, 01/05/2020 10:42 AM ______________________________________________________________________   Discussed status with Dr. Posey Pronto on 01/05/20 at 10:20AM and received approval for admission today.   Admission Coordinator:  Raechel Ache, time 10:20AM/Date 01/05/20.         Revision History

## 2020-01-05 NOTE — Progress Notes (Signed)
Inpatient Rehabilitation-Admissions Coordinator   I have received medical clearance from attending service for admit to CIR today. I have already received insurance authorization for admit today (6/2). Notified pt and her sister of bed offer. I reviewed consent forms and insurance benefits letter with her sister [name]. All questions answered. RN and [facility] team updated on plan for admit today.   Please call if questions.   Raechel Ache, OTR/L  Rehab Admissions Coordinator  [phone] 01/05/2020 10:41 AM

## 2020-01-05 NOTE — TOC CAGE-AID Note (Signed)
Transition of Care [facility]) - CAGE-AID Screening   Patient Details  Name: Robin [name] MRN: [id number] Date of Birth: 1966/03/24  Transition of Care [facility]) CM/SW Contact:    Emeterio Reeve, [place] Phone Number: 01/05/2020, 10:58 AM   Clinical Narrative:  CSW met with pt bedside. Pt responded with head shakes. Pt shook head no to alcohol use and yes to substance use. Pt shrugged shoulders to all cage aid questions. Pt shook head no to resources.   CAGE-AID Screening:    Have You Ever Felt You Ought to Cut Down on Your Drinking or Drug Use?: No Have People Annoyed You By Critizing Your Drinking Or Drug Use?: No Have You Felt Bad Or Guilty About Your Drinking Or Drug Use?: No Have You Ever Had a Drink or Used Drugs First Thing In The Morning to STeady Your Nerves or to Get Rid of a Hangover?: No CAGE-AID Score: 0  Substance Abuse Education Offered: Yes     Blima Ledger, [place] Social Worker [phone]

## 2020-01-05 NOTE — Progress Notes (Signed)
Patient ID: Robin [name], female   DOB: 30-Nov-1965, 54 y.o.   MRN: [id number] Patient was admitted to 4W06 via bed/wheelchair, escorted by nursing staff.  Patient verbalized understanding of rehab process including fall prevention policy, personal belongings policy, and visitation policy.  Patient appears to be in no immediate distress at this time.  Cortrak clamped.  Sister at bedside.  Brita Romp, RN

## 2020-01-05 NOTE — Progress Notes (Signed)
SLP Cancellation Note  Patient Details Name: Robin [name] MRN: [id number] DOB: 1966-07-13   Cancelled treatment:       Reason Eval/Treat Not Completed: Fatigue/lethargy limiting ability to participate(Pt was approached for treatment but was unable to maintain an adequate level of alertness for long enough periods to participate. SLP will follow up on subsequent date.)  [name] I. Hardin Negus, [place], [place] Office number [phone] Pager [place] 01/05/2020, 11:26 AM

## 2020-01-06 ENCOUNTER — Inpatient Hospital Stay (HOSPITAL_COMMUNITY): Payer: 59 | Admitting: Speech Pathology

## 2020-01-06 ENCOUNTER — Inpatient Hospital Stay (HOSPITAL_COMMUNITY): Payer: 59 | Admitting: Occupational Therapy

## 2020-01-06 ENCOUNTER — Inpatient Hospital Stay (HOSPITAL_COMMUNITY): Payer: 59 | Admitting: Physical Therapy

## 2020-01-06 DIAGNOSIS — I63512 Cerebral infarction due to unspecified occlusion or stenosis of left middle cerebral artery: Secondary | ICD-10-CM

## 2020-01-06 LAB — GLUCOSE, CAPILLARY
Glucose-Capillary: 175 mg/dL — ABNORMAL HIGH (ref 70–99)
Glucose-Capillary: 154 mg/dL — ABNORMAL HIGH (ref 70–99)
Glucose-Capillary: 235 mg/dL — ABNORMAL HIGH (ref 70–99)
Glucose-Capillary: 156 mg/dL — ABNORMAL HIGH (ref 70–99)
Glucose-Capillary: 211 mg/dL — ABNORMAL HIGH (ref 70–99)

## 2020-01-06 LAB — COMPREHENSIVE METABOLIC PANEL
ALT: 38 U/L (ref 0–44)
AST: 32 U/L (ref 15–41)
Albumin: 2.9 g/dL — ABNORMAL LOW (ref 3.5–5.0)
Alkaline Phosphatase: 69 U/L (ref 38–126)
Anion gap: 12 (ref 5–15)
BUN: 38 mg/dL — ABNORMAL HIGH (ref 6–20)
CO2: 20 mmol/L — ABNORMAL LOW (ref 22–32)
Calcium: 9.3 mg/dL (ref 8.9–10.3)
Chloride: 107 mmol/L (ref 98–111)
Creatinine, Ser: 1.14 mg/dL — ABNORMAL HIGH (ref 0.44–1.00)
GFR calc Af Amer: >60 mL/min (ref >60)
GFR calc non Af Amer: 55 mL/min — ABNORMAL LOW (ref >60)
Glucose, Bld: 153 mg/dL — ABNORMAL HIGH (ref 70–99)
Potassium: 4.2 mmol/L (ref 3.5–5.1)
Sodium: 139 mmol/L (ref 135–145)
Total Bilirubin: 0.7 mg/dL (ref 0.3–1.2)
Total Protein: 6.7 g/dL (ref 6.5–8.1)

## 2020-01-06 LAB — CBC WITH DIFFERENTIAL/PLATELET
Abs Immature Granulocytes: 0.03 10*3/uL (ref 0.00–0.07)
Basophils Absolute: 0 10*3/uL (ref 0.0–0.1)
Basophils Relative: 0 %
Eosinophils Absolute: 0.2 10*3/uL (ref 0.0–0.5)
Eosinophils Relative: 2 %
HCT: 41.8 % (ref 36.0–46.0)
Hemoglobin: 13 g/dL (ref 12.0–15.0)
Immature Granulocytes: 0 %
Lymphocytes Relative: 31 %
Lymphs Abs: 3.1 10*3/uL (ref 0.7–4.0)
MCH: 28.9 pg (ref 26.0–34.0)
MCHC: 31.1 g/dL (ref 30.0–36.0)
MCV: 92.9 fL (ref 80.0–100.0)
Monocytes Absolute: 0.9 10*3/uL (ref 0.1–1.0)
Monocytes Relative: 9 %
Neutro Abs: 5.7 10*3/uL (ref 1.7–7.7)
Neutrophils Relative %: 58 %
Platelets: 223 10*3/uL (ref 150–400)
RBC: 4.5 MIL/uL (ref 3.87–5.11)
RDW: 13.7 % (ref 11.5–15.5)
WBC: 9.9 10*3/uL (ref 4.0–10.5)
nRBC: 0 % (ref 0.0–0.2)

## 2020-01-06 MED ORDER — RESOURCE THICKENUP CLEAR PO POWD
ORAL | Status: DC | PRN
  Filled 2020-01-06: qty 125

## 2020-01-06 MED ORDER — INSULIN ASPART 100 UNIT/ML ~~LOC~~ SOLN
0.0000 [IU] | Freq: Three times a day (TID) | SUBCUTANEOUS | Status: DC
  Administered 2020-01-06: 3 [IU] via SUBCUTANEOUS
  Administered 2020-01-07: 5 [IU] via SUBCUTANEOUS
  Administered 2020-01-07: 3 [IU] via SUBCUTANEOUS
  Administered 2020-01-07 – 2020-01-08 (×2): 5 [IU] via SUBCUTANEOUS
  Administered 2020-01-08: 3 [IU] via SUBCUTANEOUS
  Administered 2020-01-08 – 2020-01-09 (×2): 5 [IU] via SUBCUTANEOUS
  Administered 2020-01-09 – 2020-01-10 (×3): 3 [IU] via SUBCUTANEOUS
  Administered 2020-01-10: 2 [IU] via SUBCUTANEOUS
  Administered 2020-01-10: 5 [IU] via SUBCUTANEOUS
  Administered 2020-01-11 (×3): 3 [IU] via SUBCUTANEOUS
  Administered 2020-01-12: 2 [IU] via SUBCUTANEOUS
  Administered 2020-01-12 – 2020-01-13 (×3): 3 [IU] via SUBCUTANEOUS
  Administered 2020-01-13: 2 [IU] via SUBCUTANEOUS
  Administered 2020-01-14 (×2): 3 [IU] via SUBCUTANEOUS
  Administered 2020-01-15: 2 [IU] via SUBCUTANEOUS
  Administered 2020-01-15: 3 [IU] via SUBCUTANEOUS
  Administered 2020-01-16: 2 [IU] via SUBCUTANEOUS
  Administered 2020-01-16 (×2): 3 [IU] via SUBCUTANEOUS
  Administered 2020-01-17: 2 [IU] via SUBCUTANEOUS
  Administered 2020-01-17 (×2): 3 [IU] via SUBCUTANEOUS
  Administered 2020-01-18: 8 [IU] via SUBCUTANEOUS
  Administered 2020-01-18: 3 [IU] via SUBCUTANEOUS
  Administered 2020-01-18 – 2020-01-19 (×3): 5 [IU] via SUBCUTANEOUS
  Administered 2020-01-19 – 2020-01-20 (×3): 3 [IU] via SUBCUTANEOUS
  Administered 2020-01-20: 8 [IU] via SUBCUTANEOUS
  Administered 2020-01-21: 2 [IU] via SUBCUTANEOUS
  Administered 2020-01-21: 5 [IU] via SUBCUTANEOUS
  Administered 2020-01-21 – 2020-01-22 (×2): 15 [IU] via SUBCUTANEOUS
  Administered 2020-01-22: 5 [IU] via SUBCUTANEOUS
  Administered 2020-01-22: 8 [IU] via SUBCUTANEOUS
  Administered 2020-01-23 (×2): 5 [IU] via SUBCUTANEOUS
  Administered 2020-01-23: 8 [IU] via SUBCUTANEOUS
  Administered 2020-01-24 (×2): 5 [IU] via SUBCUTANEOUS
  Administered 2020-01-24: 15 [IU] via SUBCUTANEOUS
  Administered 2020-01-25 (×2): 3 [IU] via SUBCUTANEOUS
  Administered 2020-01-25: 5 [IU] via SUBCUTANEOUS
  Administered 2020-01-26: 3 [IU] via SUBCUTANEOUS
  Administered 2020-01-26: 15 [IU] via SUBCUTANEOUS
  Administered 2020-01-26: 2 [IU] via SUBCUTANEOUS
  Administered 2020-01-27: 8 [IU] via SUBCUTANEOUS
  Administered 2020-01-27: 3 [IU] via SUBCUTANEOUS
  Administered 2020-01-27: 5 [IU] via SUBCUTANEOUS
  Administered 2020-01-28: 3 [IU] via SUBCUTANEOUS
  Administered 2020-01-28: 2 [IU] via SUBCUTANEOUS
  Administered 2020-01-28: 3 [IU] via SUBCUTANEOUS
  Administered 2020-01-29: 5 [IU] via SUBCUTANEOUS
  Administered 2020-01-29: 3 [IU] via SUBCUTANEOUS
  Administered 2020-01-29: 8 [IU] via SUBCUTANEOUS
  Administered 2020-01-30 (×2): 2 [IU] via SUBCUTANEOUS
  Administered 2020-01-30 – 2020-01-31 (×2): 3 [IU] via SUBCUTANEOUS
  Administered 2020-01-31 (×2): 2 [IU] via SUBCUTANEOUS
  Administered 2020-02-01: 3 [IU] via SUBCUTANEOUS
  Administered 2020-02-01 – 2020-02-02 (×2): 2 [IU] via SUBCUTANEOUS

## 2020-01-06 MED ORDER — GLUCERNA 1.2 CAL PO LIQD
1000.0000 mL | ORAL | Status: DC
  Administered 2020-01-06 – 2020-01-16 (×11): 1000 mL
  Filled 2020-01-06 (×12): qty 1000

## 2020-01-06 MED ORDER — BLOOD PRESSURE CONTROL BOOK
Freq: Once | Status: DC
  Filled 2020-01-06: qty 1

## 2020-01-06 MED ORDER — LIVING WELL WITH DIABETES BOOK
Freq: Once | Status: DC
  Filled 2020-01-06: qty 1

## 2020-01-06 MED ORDER — PRO-STAT SUGAR FREE PO LIQD
30.0000 mL | Freq: Two times a day (BID) | ORAL | Status: DC
  Administered 2020-01-06 – 2020-01-20 (×28): 30 mL
  Filled 2020-01-06 (×28): qty 30

## 2020-01-06 MED ORDER — STARCH (THICKENING) PO POWD
ORAL | Status: DC | PRN
  Filled 2020-01-06: qty 227

## 2020-01-06 MED ORDER — INSULIN ASPART 100 UNIT/ML ~~LOC~~ SOLN
0.0000 [IU] | Freq: Every day | SUBCUTANEOUS | Status: DC
  Administered 2020-01-06: 2 [IU] via SUBCUTANEOUS
  Administered 2020-01-07: 3 [IU] via SUBCUTANEOUS
  Administered 2020-01-08 – 2020-01-17 (×3): 2 [IU] via SUBCUTANEOUS
  Administered 2020-01-18: 3 [IU] via SUBCUTANEOUS
  Administered 2020-01-19 – 2020-01-20 (×2): 2 [IU] via SUBCUTANEOUS
  Administered 2020-01-21 – 2020-01-22 (×2): 4 [IU] via SUBCUTANEOUS
  Administered 2020-01-24: 3 [IU] via SUBCUTANEOUS
  Administered 2020-01-25 – 2020-01-26 (×2): 4 [IU] via SUBCUTANEOUS
  Administered 2020-01-27: 3 [IU] via SUBCUTANEOUS
  Administered 2020-01-28 – 2020-01-30 (×3): 2 [IU] via SUBCUTANEOUS

## 2020-01-06 NOTE — Progress Notes (Signed)
Patient information reviewed and entered into eRehab System by Becky Audery Wassenaar, PPS coordinator. Information including medical coding, function ability, and quality indicators will be reviewed and updated through discharge.   

## 2020-01-06 NOTE — Progress Notes (Signed)
Initial Nutrition Assessment  RD working remotely.  DOCUMENTATION CODES:   Obesity unspecified  INTERVENTION:   Tube feeds via Cortrak: - Glucerna 1.2 @ 80 ml/hr to run for 12 hours from 1800 to 0600 (total of 960 ml) - Pro-stat 30 ml BID - Free water per MD, currently 200 ml q 6 hours  Tube feeding regimen and free water provides 1352 kcal, 88 grams of protein, and 1573 ml of H2O (75% of kcal needs, 88% of protein needs).  NUTRITION DIAGNOSIS:   Inadequate oral intake related to dysphagia as evidenced by meal completion < 25%.  GOAL:   Patient will meet greater than or equal to 90% of their needs  MONITOR:   PO intake, Diet advancement, Labs, Weight trends, TF tolerance  REASON FOR ASSESSMENT:   Consult Enteral/tube feeding initiation and management  ASSESSMENT:   54 year old female with PMH of CHF, HTN, atrial fibrillation, COPD, polysubstance abuse, DM, and tobacco abuse. Presented on 12/27/19 with right hemiparesis, AMS, and aphasia. MRI showed evolving left MCA infarction with hemorrhagic transformation. Pt underwent mechanical thrombectomy per interventional radiology and required intubation and was extubated 12/28/19. Started on dysphagia 1 diet with pudding-thick liquids with Cortrak tube in place for nutritional support. Admitted to CIR on 01/05/20.   RD consulted for tube feeding initiation and management. Pt with gastric Cortrak in place.  Pt with aphasia. Unable to obtain diet and weight history at this time.  Current TF: Glucerna 1.2 @ 60 ml/hr, Pro-stat 30 ml daily, free water 200 ml q 4 hours  Will transition pt to nocturnal tube feeds to stimulate appetite and promote PO intake during the daytime. Discussed with RN.  Reviewed weight history in chart. Weight stable between 95-98 kg over the last year.  Meal Completion: 15% x 1 meal  Medications reviewed and include: SSI q 4 hours, Novolog 2 units q 4 hours, Lantus 60 units daily, Metformin, MVI with  minerals, protonix, Klor-con 40 mEq daily, torsemide  Labs reviewed. CBG's: 154-281 x 24 hours  NUTRITION - FOCUSED PHYSICAL EXAM:  Unable to complete at this time. RD working remotely.  Diet Order:   Diet Order            DIET - DYS 1 Room service appropriate? Yes; Fluid consistency: Pudding Thick  Diet effective now              EDUCATION NEEDS:   No education needs have been identified at this time  Skin:  Skin Assessment: Reviewed RN Assessment  Last BM:  01/05/20 small type 6  Height:   Ht Readings from Last 1 Encounters:  12/31/19 5\' 6"  (1.676 m)    Weight:   Wt Readings from Last 1 Encounters:  01/06/20 95.8 kg    Ideal Body Weight:  59.1 kg  BMI:  Body mass index is 34.09 kg/m.  Estimated Nutritional Needs:   Kcal:  1800-2000  Protein:  100-115 grams  Fluid:  1.8-2.0 L    Gaynell Face, MS, RD, LDN Inpatient Clinical Dietitian Pager: [phone] Weekend/After Hours: [phone]
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
## 2020-01-06 NOTE — Progress Notes (Signed)
Inpatient Rehabilitation Medication Review by a Pharmacist  A complete drug regimen review was completed for this patient to identify any potential clinically significant medication issues.  Clinically significant medication issues were identified:  no   Type of Medication Issue Identified Description of Issue Plan Plan Accepted by Provider? (Yes / No)  Drug Interaction(s) (clinically significant)      Duplicate Therapy      Allergy      No Medication Administration End Date      Incorrect Dose      Additional Drug Therapy Needed      Other  -Home dulaglutide and Novolog 70/30 held due to non-formulary status -Home hydralazine switched to isosorbide-hydralazine  -Continue insulin glargine, insulin sliding scale and metformin -Continue to monitor blood pressure       Time spent performing this drug regimen review (minutes):  [place], PharmD, BCPS, [facility] Clinical Pharmacist  Please check AMION for all [place] phone numbers After 10:00 PM, call [place]

## 2020-01-06 NOTE — IPOC Note (Addendum)
Overall Plan of Care [facility]) Patient Details Name: Robin [name] MRN: [id number] DOB: 1965-09-21  Admitting Diagnosis: Left middle cerebral artery stroke [facility])  Hospital Problems: Principal Problem:   Left middle cerebral artery stroke (HCC) Active Problems:   AKI (acute kidney injury) ([place])   Diabetic peripheral neuropathy (HCC)   Essential hypertension   Chronic diastolic congestive heart failure (HCC)   Right hemiparesis (HCC)     Functional Problem List: Nursing Bladder, Bowel, Endurance, Medication Management, Motor, Nutrition, Safety, Sensory, Skin Integrity, Pain  PT Balance, Perception, Behavior, Safety, Sensory, Edema, Endurance, Skin Integrity, Motor, Nutrition, Pain  OT Balance, Cognition, Perception, Safety, Sensory, Endurance, Motor  SLP Cognition, Linguistic, Nutrition  TR         Basic ADL's: OT Eating, Grooming, Bathing, Dressing, Toileting     Advanced  ADL's: OT       Transfers: PT Bed Mobility, Bed to Chair, Car, Manufacturing systems engineer, Metallurgist: PT Ambulation, Emergency planning/management officer, Stairs     Additional Impairments: OT Fuctional Use of Upper Extremity  SLP Swallowing, Communication, Social Cognition comprehension, expression Attention, Awareness  TR      Anticipated Outcomes Item Anticipated Outcome  Self Feeding supervision  Swallowing  Min A   Basic self-care  min assist  Toileting  min assist   Bathroom Transfers min assist  Bowel/Bladder  Mod assist  Transfers  min assist  Locomotion  min assist  Communication  Mod A  Cognition  Min A  Pain  <4  Safety/Judgment  Min assist   Therapy Plan: PT Intensity: Minimum of 1-2 x/day ,45 to 90 minutes PT Frequency: 5 out of 7 days PT Duration Estimated Length of Stay: ~ 4 weeks OT Intensity: Minimum of 1-2 x/day, 45 to 90 minutes OT Frequency: 5 out of 7 days OT Duration/Estimated Length of Stay: 24-28 days SLP Intensity: Minumum of 1-2 x/day, 30 to 90  minutes SLP Frequency: 3 to 5 out of 7 days SLP Duration/Estimated Length of Stay: ~3.5-4 weeks   Due to the current state of emergency, patients may not be receiving their 3-hours of Medicare-mandated therapy.   Team Interventions: Nursing Interventions Patient/Family Education, Bladder Management, Bowel Management, Disease Management/Prevention, Medication Management, Pain Management, Discharge Planning, Psychosocial Support, Dysphagia/Aspiration Precaution Training, Cognitive Remediation/Compensation, Skin Care/Wound Management  PT interventions Ambulation/gait training, Community reintegration, DME/adaptive equipment instruction, Psychosocial support, Neuromuscular re-education, Stair training, UE/LE Strength taining/ROM, Wheelchair propulsion/positioning, Discharge planning, Training and development officer, Functional electrical stimulation, Pain management, Skin care/wound management, Therapeutic Activities, UE/LE Coordination activities, Cognitive remediation/compensation, Disease management/prevention, Functional mobility training, Patient/family education, Splinting/orthotics, Therapeutic Exercise, Visual/perceptual remediation/compensation  OT Interventions Balance/vestibular training, Discharge planning, Functional electrical stimulation, Pain management, Self Care/advanced ADL retraining, Therapeutic Activities, UE/LE Coordination activities, Visual/perceptual remediation/compensation, Therapeutic Exercise, Patient/family education, Functional mobility training, Cognitive remediation/compensation, Disease mangement/prevention, Community reintegration, Engineer, drilling, Neuromuscular re-education, Psychosocial support, Splinting/orthotics, UE/LE Strength taining/ROM, Wheelchair propulsion/positioning  SLP Interventions Cognitive remediation/compensation, English as a second language teacher, Dysphagia/aspiration precaution training, Functional tasks, Environmental controls, Internal/external aids,  Multimodal communication approach, Speech/Language facilitation, Therapeutic Activities, Patient/family education  TR Interventions    SW/CM Interventions Discharge Planning, Psychosocial Support, Patient/Family Education   Barriers to Discharge MD  Medical stability  Nursing      PT Inaccessible home environment, Decreased caregiver support, Medical stability, Incontinence, Nutrition means, Behavior, Weight, Lack of/limited family support, Neurogenic Bowel & Bladder, Home environment access/layout    OT Decreased caregiver support Will need 24 hour min assist or more  SLP  SW       Team Discharge Planning: Destination: PT-Home ,OT- Home , SLP-Home Projected Follow-up: PT-Home health PT, 24 hour supervision/assistance, OT-  Home health OT, 24 hour supervision/assistance, SLP-Home Health SLP, 24 hour supervision/assistance Projected Equipment Needs: PT-To be determined, OT- To be determined, SLP-None recommended by SLP Equipment Details: PT- , OT-  Patient/family involved in discharge planning: PT- Patient unable/family or caregiver not available, Other (Comment)(pt appears agreeable nodding head "yes"),  OT-Patient unable/family or caregiver not available, SLP-Patient unable/family or caregive not available  MD ELOS: 21-25d Medical Rehab Prognosis:  Good Assessment:  54 year old right-handed female history of diastolic congestive heart failure, hypertension, atrial fibrillation on Eliquis, COPD, polysubstance abuse, diabetes mellitus and tobacco abuse.  He presented on 12/27/2019 with right hemiparesis, AMS, and aphasia to [facility].  Cranial CT scan showed question evolving hypodensity involving the left basal ganglia left insula and adjacent left temporal lobe.  CT perfusion scan as well as CTA of head and neck showed a 24 cc acute infarct deep and subcortical white matter of the anterior left frontal region.  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   Patient did not receive TPA.  Patient was transferred to [facility].  MRI showed evolving left MCA infarction with hemorrhagic transformation.  Mass-effect with 6 mm rightward midline shift.  Admission chemistries urine drug screen positive cocaine, glucose 355, creatinine 1.15, troponin negative, BNP 367, alcohol negative.  Echocardiogram with ejection fraction of 35% with global hypokinesis.  Patient underwent mechanical thrombectomy per interventional radiology required intubation and was extubated 12/28/2019.  Initially maintained on low-dose aspirin and transitioned back to Eliquis.  Hospital course further complicated by dysphagia.  Started on dysphagia #1 pudding thick liquid diet with nasogastric tube for nutritional support.  Therapy evaluations completed and patient was admitted for a comprehensive rehab program   Now requiring 24/7 Rehab RN,MD, as well as CIR level PT, OT and SLP.  Treatment team will focus on ADLs and mobility with goals set at [facility] A/Mod A  See Team Conference Notes for weekly updates to the plan of care
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
## 2020-01-06 NOTE — Progress Notes (Signed)
PHYSICAL MEDICINE & REHABILITATION PROGRESS NOTE   Subjective/Complaints:  Pt aphasic , some incomprehensible sounds  ROS- cannot obtain due to aphasia  Objective:   No results found. Recent Labs    01/05/20 0603 01/06/20 0549  WBC 9.2 9.9  HGB 13.2 13.0  HCT 42.2 41.8  PLT 215 223   Recent Labs    01/04/20 1418 01/06/20 0549  NA 142 139  K 4.9 4.2  CL 108 107  CO2 23 20*  GLUCOSE 220* 153*  BUN 28* 38*  CREATININE 1.20* 1.14*  CALCIUM 9.2 9.3    Intake/Output Summary (Last 24 hours) at 01/06/2020 0844 Last data filed at 01/06/2020 0600 Gross per 24 hour  Intake 1215 ml  Output --  Net 1215 ml     Physical Exam: Vital Signs Blood pressure 121/78, pulse 80, temperature 97.7 F (36.5 C), temperature source Oral, resp. rate 18, weight 95.8 kg, last menstrual period 08/21/2011, SpO2 98 %.   General: No acute distress Mood and affect are appropriate Heart: Regular rate and rhythm no rubs murmurs or extra sounds Lungs: Clear to auscultation, breathing unlabored, no rales or wheezes Abdomen: Positive bowel sounds, soft nontender to palpation, nondistended Extremities: No clubbing, cyanosis, or edema Skin: No evidence of breakdown, no evidence of rash Neurologic: Cranial nerves II through XII intact, motor strength is 5/5 in Left and 0/5 RIght  deltoid, bicep, tricep,hip flexor, knee extensors, ankle dorsiflexor and plantar flexor 2- RIght grip 5/5/ Left grip  Sensory exam normal sensation to light touch and proprioception in bilateral upper and lower extremities Cerebellar exam normal finger to nose to finger as well as heel to shin in bilateral upper and lower extremities Musculoskeletal: Full range of motion in all 4 extremities. No joint swelling   Assessment/Plan: 1. Functional deficits secondary to Cardioembolic Left MCA infarct  which require 3+ hours per day of interdisciplinary therapy in a comprehensive inpatient rehab setting.  Physiatrist is  providing close team supervision and 24 hour management of active medical problems listed below.  Physiatrist and rehab team continue to assess barriers to discharge/monitor patient progress toward functional and medical goals  Care Tool:  Bathing              Bathing assist       Upper Body Dressing/Undressing Upper body dressing   What is the patient wearing?: Hospital gown only    Upper body assist Assist Level: Dependent - Patient 0%    Lower Body Dressing/Undressing Lower body dressing            Lower body assist       Toileting Toileting    Toileting assist Assist for toileting: 2 Helpers     Transfers Chair/bed transfer  Transfers assist  Chair/bed transfer activity did not occur: Safety/medical concerns        Locomotion Ambulation   Ambulation assist              Walk 10 feet activity   Assist           Walk 50 feet activity   Assist           Walk 150 feet activity   Assist           Walk 10 feet on uneven surface  activity   Assist           Wheelchair     Assist  Wheelchair 50 feet with 2 turns activity    Assist            Wheelchair 150 feet activity     Assist          Blood pressure 121/78, pulse 80, temperature 97.7 F (36.5 C), temperature source Oral, resp. rate 18, weight 95.8 kg, last menstrual period 08/21/2011, SpO2 98 %.  Medical Problem List and Plan: 1.  Right-sided hemiparesis and aphasia secondary to left MCA infarction/petechial hemorrhage with saccular aneurysm at the paraclinoid right ICA status post mechanical thrombectomy             -patient may shower             -ELOS/Goals: 20-24 days/min/mod A.    CIR PT< OT, SLP evals 2.  Antithrombotics: -DVT/anticoagulation: Eliquis             -antiplatelet therapy: N/A 3. Pain Management: Neurontin 100 mg every 8 hours, Zanaflex 2 mg twice daily as needed             Monitor with  increased exertion 4. Mood: Provide emotional support             -antipsychotic agents: N/A 5. Neuropsych: This patient is not capable of making decisions on her own behalf. 6. Skin/Wound Care: Routine skin checks 7. Fluids/Electrolytes/Nutrition: Routine in and outs.  CMP ordered for tomorrow. 8.  Post stroke dysphagia.  Dysphagia #1 pudding thick liquids.  Follow-up speech therapy. 9.  Hypertension.  Coreg 50 mg twice daily,Bidil 20-37.5 mg 2 tabs 3 times daily            Vitals:   01/06/20 0415 01/06/20 0828  BP: 121/78   Pulse: 90 80  Resp:    Temp: 97.7 F (36.5 C)   SpO2: 98%   controlled 6/3 10.  Atrial fibrillation.  Lanoxin 0.125 mg daily.  Cardiac rate controlled- back on Eliquis  11.  Chronic diastolic congestive heart failure.  Demadex 20 mg daily.  Monitor for any signs of fluid overload             Daily weights 12.  Diabetes mellitus with peripheral neuropathy.  Hemoglobin A1c 11.6.  NovoLog 2 units every 4 hours, Lantus insulin 60 units nightly, Glucophage 500 mg twice daily CBG (last 3)  Recent Labs    01/05/20 2351 01/06/20 0415 01/06/20 0835  GLUCAP 162* 175* 154*  fair control monitor as activity increases               Monitor with increased mobility 13.  COPD with tobacco abuse.  Continue nebulizer treatments 14.  Polysubstance abuse.  Urine drug screen positive cocaine.  Counseled when appropriate 15.  Hyperlipidemia.  Crestor 16.  GERD.  Protonix    LOS: 1 days A FACE TO FACE EVALUATION WAS PERFORMED  Charlett Blake 01/06/2020, 8:44 AM

## 2020-01-06 NOTE — Progress Notes (Signed)
Inpatient [place] [place] Individual Statement of Services  Patient Name:  Robin [name]  Date:  01/06/2020  Welcome to the [facility].  Our goal is to provide you with an individualized program based on your diagnosis and situation, designed to meet your specific needs.  With this comprehensive rehabilitation program, you will be expected to participate in at least 3 hours of rehabilitation therapies Monday-Friday, with modified therapy programming on the weekends.  Your rehabilitation program will include the following services:  Physical Therapy (PT), Occupational Therapy (OT), Speech Therapy (ST), 24 hour per day rehabilitation nursing, Therapeutic Recreaction (TR), Neuropsychology, Care Coordinator, Rehabilitation Medicine, Nutrition Services, Pharmacy Services and Other  Weekly team conferences will be held on Wednesdays to discuss your progress.  Your Inpatient Rehabilitation Care Coordinator will talk with you frequently to get your input and to update you on team discussions.  Team conferences with you and your family in attendance may also be held.  Expected length of stay: 24-28 Days  Overall anticipated outcome: Min A  Depending on your progress and recovery, your program may change. Your Inpatient Rehabilitation Care Coordinator will coordinate services and will keep you informed of any changes. Your Inpatient Rehabilitation Care Coordinator's name and contact numbers are listed  below.  The following services may also be recommended but are not provided by the [facility]:    [place] will be made to provide these services after discharge if needed.  Arrangements include referral to agencies that provide these services.  Your insurance has been verified to be:  [place] primary doctor is:  Inda Coke, [place]  Pertinent information will be  shared with your doctor and your insurance company.  Inpatient Rehabilitation Care Coordinator:  Erlene Quan, [place] or [phone]  Information discussed with and copy given to patient by: Dyanne Iha, 01/06/2020, 9:03 AM
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
## 2020-01-07 ENCOUNTER — Inpatient Hospital Stay (HOSPITAL_COMMUNITY): Payer: 59 | Admitting: Occupational Therapy

## 2020-01-07 ENCOUNTER — Inpatient Hospital Stay (HOSPITAL_COMMUNITY): Payer: 59 | Admitting: Physical Therapy

## 2020-01-07 ENCOUNTER — Inpatient Hospital Stay (HOSPITAL_COMMUNITY): Payer: 59 | Admitting: Speech Pathology

## 2020-01-07 LAB — GLUCOSE, CAPILLARY
Glucose-Capillary: 207 mg/dL — ABNORMAL HIGH (ref 70–99)
Glucose-Capillary: 232 mg/dL — ABNORMAL HIGH (ref 70–99)
Glucose-Capillary: 153 mg/dL — ABNORMAL HIGH (ref 70–99)
Glucose-Capillary: 251 mg/dL — ABNORMAL HIGH (ref 70–99)

## 2020-01-07 MED ORDER — INSULIN GLARGINE 100 UNIT/ML ~~LOC~~ SOLN
65.0000 [IU] | Freq: Every day | SUBCUTANEOUS | Status: DC
  Administered 2020-01-07 – 2020-01-09 (×3): 65 [IU] via SUBCUTANEOUS
  Filled 2020-01-07 (×4): qty 0.65

## 2020-01-07 MED ORDER — FREE WATER
250.0000 mL | Freq: Four times a day (QID) | Status: DC
  Administered 2020-01-07 – 2020-01-19 (×49): 250 mL

## 2020-01-07 NOTE — Progress Notes (Signed)
Physical Therapy Session Note  Patient Details  Name: Robin [name] MRN: [id number] Date of Birth: 1965-09-12  Today's Date: 01/07/2020 PT Individual Time: 3354-5625 PT Individual Time Calculation (min): 47 min   and  Today's Date: 01/07/2020 PT Missed Time: 28 Minutes Missed Time Reason: Patient fatigue  Short Term Goals: Week 1:  PT Short Term Goal 1 (Week 1): Pt will perform supine<>sit with mod assist PT Short Term Goal 2 (Week 1): Pt will perform sit<>stand using LRAD with max assist of 1 (not using lift) PT Short Term Goal 3 (Week 1): Pt will perform bed<>chair transfers with max assist of 2 (not using lift)  Skilled Therapeutic Interventions/Progress Updates:    Pt received R sidelying in bed with RN present to administer medications requesting assistance to get pt upright. Pt noted to be soaked through clothes and sheets down to the mattress in urine. Pt not opening eyes despite verbal and tactile cuing but once provided manual facilitation and cuing to initiate sitting EOB pt started moving LEs off EOB and assisting with trunk upright - completed with max assist for trunk support. Once sitting EOB, pt opens eyes and is able to maintain static sitting with L UE support on footboard though continues to be significantly drowsy/fatigued leaning onto L UE support and often closing eyes. Sit>stand elevated EOB>stedy with +2 max/total assist to lift hips enough to place stedy seat. Stedy transfer to w/c. NT assisted with cleaning bed and pt returned to bed again requiring +2 max/total assist for lifting to get onto stedy seat and stedy transfer back to bed. Sit>supine with max assist for B LE management into the bed. Performed R/L rolling with cuing for technique and manual facilitation for R hemibody involvement while therapist and +2 assist performed total assist LB clothing management and peri-care. Pt left supine in bed quickly falling asleep. Missed 28 minutes of skilled physical  therapy.  Therapy Documentation Precautions:  Precautions Precautions: Fall Precaution Comments: R hemiparesis, R inattention Restrictions Weight Bearing Restrictions: No  Pain: No indications of pain.   Therapy/Group: Individual Therapy  Tawana Scale, PT, DPT 01/07/2020, 12:56 PM

## 2020-01-07 NOTE — Progress Notes (Signed)
Speech Language Pathology Daily Session Note  Patient Details  Name: Robin [name] MRN: [id number] Date of Birth: 19-Mar-1966  Today's Date: 01/07/2020 SLP Individual Time: 1003-1045 SLP Individual Time Calculation (min): 42 min  Short Term Goals: Week 1: SLP Short Term Goal 1 (Week 1): Pt will consume current diet with minimal overt s/sx aspiration and efficient mastication and oral clearance with Mod A for use of safe swallow strategies. SLP Short Term Goal 2 (Week 1): Pt will consume trials of dys 2 solids with efficient masication and oral clearance X2 prior to advancement. SLP Short Term Goal 3 (Week 1): Pt will consume trials of thin liquids with minimal overt s/sx aspiration X3 prior to repeat MBSS. SLP Short Term Goal 4 (Week 1): Pt will name common objects with 25% accuracy provided Max A multimodal cues. SLP Short Term Goal 5 (Week 1): Pt will answer yes/no questions with 75% accuracy with Max A multimodal cues. SLP Short Term Goal 6 (Week 1): Pt will express basic wants and needs via gestures or other multimodal means with Max A cues.  Skilled Therapeutic Interventions:  Pt was seen for skilled ST targeting goals for communication.  Pt was seated upright in wheelchair upon therapist's arrival and appeared lethargic but indicated willingness to participate in therapy with a head nod.  Pt required total assist to identify an object when named from a field of two even when the written name of the object was provided.  Pt was unable to verbally name the object despite total assist multimodal cues.  Pt began to grimace while seated in the wheelchair.  She indicated via head nod and gestures that her left leg was hurting.  Pt impulsively removed her seat belt alarm and attempted to stand up from tilt in space wheelchair to get back in bed.  Chair alarm did not appear to improve pt's safety awareness as pt continued to try to stand unassisted while alarm was sounding.  SLP was able to redirect  pt to wheelchair while +2 assist was summoned from nursing.  Pt was transferred back to bed where she became increasingly lethargic.  White coating was noted on tongue in addition to halitosis; as a result, therapist performed oral care in order to decrease bacterial load and risk of infection while also attempting to awaken pt.  Pt allowed care but did not awaken further.  As a result, session was ended early due to pt fatigue.  Pt was left in bed with bed alarm set and call bell within reach.  Continue per current plan of care.    Pain Pain Assessment Pain Scale: Faces Faces Pain Scale: Hurts little more Pain Location: Leg Pain Orientation: Left Pain Descriptors / Indicators: Grimacing Pain Intervention(s): Repositioned  Therapy/Group: Individual Therapy  Davide Risdon, Selinda Orion 01/07/2020, 11:33 AM

## 2020-01-07 NOTE — Progress Notes (Signed)
[place] PHYSICAL MEDICINE & REHABILITATION PROGRESS NOTE   Subjective/Complaints: No new issues, discussed with OT ROS- cannot obtain due to aphasia  Objective:   No results found. Recent Labs    01/05/20 0603 01/06/20 0549  WBC 9.2 9.9  HGB 13.2 13.0  HCT 42.2 41.8  PLT 215 223   Recent Labs    01/04/20 1418 01/06/20 0549  NA 142 139  K 4.9 4.2  CL 108 107  CO2 23 20*  GLUCOSE 220* 153*  BUN 28* 38*  CREATININE 1.20* 1.14*  CALCIUM 9.2 9.3    Intake/Output Summary (Last 24 hours) at 01/07/2020 0830 Last data filed at 01/07/2020 0645 Gross per 24 hour  Intake 1120 ml  Output --  Net 1120 ml     Physical Exam: Vital Signs Blood pressure 123/77, pulse (!) 108, temperature 98.2 F (36.8 C), temperature source Oral, resp. rate 18, weight 93.6 kg, last menstrual period 08/21/2011, SpO2 99 %.   General: No acute distress Mood and affect are appropriate Heart: Regular rate and rhythm no rubs murmurs or extra sounds Lungs: Clear to auscultation, breathing unlabored, no rales or wheezes Abdomen: Positive bowel sounds, soft nontender to palpation, nondistended Extremities: No clubbing, cyanosis, or edema Skin: No evidence of breakdown, no evidence of rash  Neurologic: Cranial nerves II through XII intact, motor strength is 5/5 in Left and 0/5 RIght  deltoid, bicep, tricep,hip flexor, knee extensors, ankle dorsiflexor and plantar flexor 2- RIght grip 5/5/ Left grip  Sensory exam normal sensation to light touch and proprioception in bilateral upper and lower extremities Cerebellar exam normal finger to nose to finger as well as heel to shin in bilateral upper and lower extremities Musculoskeletal: Full range of motion in all 4 extremities. No joint swelling   Assessment/Plan: 1. Functional deficits secondary to Cardioembolic Left MCA infarct  which require 3+ hours per day of interdisciplinary therapy in a comprehensive inpatient rehab setting.  Physiatrist is  providing close team supervision and 24 hour management of active medical problems listed below.  Physiatrist and rehab team continue to assess barriers to discharge/monitor patient progress toward functional and medical goals  Care Tool:  Bathing    Body parts bathed by patient: Chest, Abdomen, Face   Body parts bathed by helper: Right arm, Left arm, Front perineal area, Buttocks, Right upper leg, Left upper leg, Right lower leg, Left lower leg     Bathing assist Assist Level: 2 Helpers     Upper Body Dressing/Undressing Upper body dressing   What is the patient wearing?: Pull over shirt    Upper body assist Assist Level: Total Assistance - Patient < 25%    Lower Body Dressing/Undressing Lower body dressing      What is the patient wearing?: Pants, Incontinence brief     Lower body assist Assist for lower body dressing: 2 Helpers     Toileting Toileting    Toileting assist Assist for toileting: 2 Helpers     Transfers Chair/bed transfer  Transfers assist  Chair/bed transfer activity did not occur: Safety/medical concerns  Chair/bed transfer assist level: 2 Helpers(stedy)     Locomotion Ambulation   Ambulation assist   Ambulation activity did not occur: Safety/medical concerns          Walk 10 feet activity   Assist  Walk 10 feet activity did not occur: Safety/medical concerns        Walk 50 feet activity   Assist Walk 50 feet with 2 turns activity did  not occur: Safety/medical concerns         Walk 150 feet activity   Assist Walk 150 feet activity did not occur: Safety/medical concerns         Walk 10 feet on uneven surface  activity   Assist Walk 10 feet on uneven surfaces activity did not occur: Safety/medical concerns         Wheelchair     Assist Will patient use wheelchair at discharge?: (TBD)             Wheelchair 50 feet with 2 turns activity    Assist            Wheelchair 150 feet  activity     Assist          Blood pressure 123/77, pulse (!) 108, temperature 98.2 F (36.8 C), temperature source Oral, resp. rate 18, weight 93.6 kg, last menstrual period 08/21/2011, SpO2 99 %.  Medical Problem List and Plan: 1.  Right-sided hemiparesis and aphasia secondary to left MCA infarction/petechial hemorrhage with saccular aneurysm at the paraclinoid right ICA status post mechanical thrombectomy             -patient may shower             -ELOS/Goals: 20-24 days/min/mod A.    CIR PT< OT, SLP evals 2.  Antithrombotics: -DVT/anticoagulation: Eliquis             -antiplatelet therapy: N/A 3. Pain Management: Neurontin 100 mg every 8 hours, Zanaflex 2 mg twice daily as needed             Monitor with increased exertion 4. Mood: Provide emotional support             -antipsychotic agents: N/A 5. Neuropsych: This patient is not capable of making decisions on her own behalf. 6. Skin/Wound Care: Routine skin checks 7. Fluids/Electrolytes/Nutrition: Routine in and outs.  CMP ordered for tomorrow. 8.  Post stroke dysphagia.  Dysphagia #1 pudding thick liquids.  Follow-up speech therapy. 9.  Hypertension.  Coreg 50 mg twice daily,Bidil 20-37.5 mg 2 tabs 3 times daily            Vitals:   01/06/20 2047 01/07/20 0400  BP: 117/76 123/77  Pulse: 83 (!) 108  Resp: 18 18  Temp: (!) 97.4 F (36.3 C) 98.2 F (36.8 C)  SpO2: 99% 99%  controlled 6/4 10.  Atrial fibrillation.  Lanoxin 0.125 mg daily.  Cardiac rate controlled- back on Eliquis  11.  Chronic diastolic congestive heart failure.  Demadex 20 mg daily.  Monitor for any signs of fluid overload             Daily weights 12.  Diabetes mellitus with peripheral neuropathy.  Hemoglobin A1c 11.6.  NovoLog 2 units every 4 hours, Lantus insulin 60 units nightly, Glucophage 500 mg twice daily CBG (last 3)  Recent Labs    01/06/20 1721 01/06/20 2132 01/07/20 0623  GLUCAP 156* 211* 207*  Increased am CBG will increase Lantus  6/4              Monitor with increased mobility 13.  COPD with tobacco abuse.  Continue nebulizer treatments 14.  Polysubstance abuse.  Urine drug screen positive cocaine.  Counseled when appropriate 15.  Hyperlipidemia.  Crestor 16.  GERD.  Protonix    LOS: 2 days A FACE TO FACE EVALUATION WAS PERFORMED  Charlett Blake 01/07/2020, 8:30 AM

## 2020-01-07 NOTE — Progress Notes (Signed)
Patient ID: Robin [name], female   DOB: [birth year], 54 y.o.   MRN: [id number]  Met with patient and sister to review care manager role and nursing needs. Reviewed post stroke/secondary stroke prevention information including DM, HTN, HLD and HF management at discharge. Reviewed handouts and handbooks with patient and sister for medications and monitoring/weight checks daily, etc. Both acknowledge understanding of information reviewed and continued reinforcement expected thru discharge. Sister reported she was not aware of all medications (insulin) patient was taking PTA; aware of metformin; however she was aware of the HF and daily weight check and fluid pill being taken. Discussed administration of insulin with family once the dosages were confirmed for discharge.

## 2020-01-07 NOTE — Progress Notes (Signed)
Occupational Therapy Session Note  Patient Details  Name: Robin [name] MRN: [id number] Date of Birth: 09-Mar-1966  Today's Date: 01/07/2020 OT Individual Time: 2229-7989 OT Individual Time Calculation (min): 58 min    Short Term Goals: Week 1:  OT Short Term Goal 1 (Week 1): Pt will maintain static sitting balance EOB with close supervision in preparation for selfcare tasks. OT Short Term Goal 2 (Week 1): Pt will complete UB bathing with no more than mod instructional cueing and min assist. OT Short Term Goal 3 (Week 1): Pt will donn a pullover shirt with max assist for 2 consecutive sessions. OT Short Term Goal 4 (Week 1): Pt will follow one step instructional commands related to selfcare tasks with 50% accuracy during OT session. OT Short Term Goal 5 (Week 1): Pt will complete LB bathing sit to stand with max assist for two consecutive sessions.  Skilled Therapeutic Interventions/Progress Updates:    Pt completed bathing and dressing sit to stand from the EOB during session.  She was able to complete supine to sit with max assist to begin session.  She was able to maintain static sitting balance EOB with close supervision.  She was able to complete UB bathing with mod assist, with max assist hand over hand to complete washing of the LUE with the hemi RUE.  Max instructional cueing with occasional demonstrational cueing was needed for sequencing bathing as well.  She was able to wash her legs with overall min assist in sitting and did not attempt washing peri area or buttocks as she stated "no" when asked if they needed to be washed.  When asked if they were washed earlier, she nodded her head "yes".  She was able to donn her pullover shirt with max assist.  When directed she was unable to understand and follow hemi techniques to thread her RUE first.  She completed donning pull up pants at the same level sit to stand.  Therapist assisted with donning TEDs and gripper socks secondary to pt not  being able to follow one handed technique.  Max assist squat pivot transfer was completed to the tilt in space wheelchair with pt finishing session with oral hygiene with min assist at the sink with use of the suction toothbrush.  She was left tilted back in the wheelchair at end of session with her eyes closed and call button and phone in reach.  Safety alarm belt was also in place.    Therapy Documentation Precautions:  Precautions Precautions: Fall Precaution Comments: R hemiparesis, R inattention Restrictions Weight Bearing Restrictions: No  Pain: Pain Assessment Pain Scale: Faces Pain Score: 0-No pain Faces Pain Scale: Hurts little more Pain Location: Leg Pain Orientation: Left Pain Descriptors / Indicators: Grimacing Pain Intervention(s): Repositioned ADL: See Care Tool Section for details of mobility and selfcare  Therapy/Group: Individual Therapy  Drue Harr OTR/L 01/07/2020, 12:18 PM

## 2020-01-08 ENCOUNTER — Inpatient Hospital Stay (HOSPITAL_COMMUNITY): Payer: 59 | Admitting: Physical Therapy

## 2020-01-08 ENCOUNTER — Inpatient Hospital Stay (HOSPITAL_COMMUNITY): Payer: 59 | Admitting: Occupational Therapy

## 2020-01-08 ENCOUNTER — Inpatient Hospital Stay (HOSPITAL_COMMUNITY): Payer: 59 | Admitting: Speech Pathology

## 2020-01-08 DIAGNOSIS — I10 Essential (primary) hypertension: Secondary | ICD-10-CM

## 2020-01-08 DIAGNOSIS — E1142 Type 2 diabetes mellitus with diabetic polyneuropathy: Secondary | ICD-10-CM

## 2020-01-08 DIAGNOSIS — N179 Acute kidney failure, unspecified: Secondary | ICD-10-CM

## 2020-01-08 DIAGNOSIS — I5032 Chronic diastolic (congestive) heart failure: Secondary | ICD-10-CM

## 2020-01-08 LAB — GLUCOSE, CAPILLARY
Glucose-Capillary: 211 mg/dL — ABNORMAL HIGH (ref 70–99)
Glucose-Capillary: 224 mg/dL — ABNORMAL HIGH (ref 70–99)
Glucose-Capillary: 178 mg/dL — ABNORMAL HIGH (ref 70–99)
Glucose-Capillary: 233 mg/dL — ABNORMAL HIGH (ref 70–99)

## 2020-01-08 NOTE — Progress Notes (Signed)
PHYSICAL MEDICINE & REHABILITATION PROGRESS NOTE   Subjective/Complaints: Patient seen laying in bed this morning.  No reported issues overnight.  ROS- cannot obtain due to aphasia  Objective:   No results found. Recent Labs    01/06/20 0549  WBC 9.9  HGB 13.0  HCT 41.8  PLT 223   Recent Labs    01/06/20 0549  NA 139  K 4.2  CL 107  CO2 20*  GLUCOSE 153*  BUN 38*  CREATININE 1.14*  CALCIUM 9.3    Intake/Output Summary (Last 24 hours) at 01/08/2020 1508 Last data filed at 01/08/2020 0847 Gross per 24 hour  Intake 1628.2 ml  Output 700 ml  Net 928.2 ml     Physical Exam: Vital Signs Blood pressure 114/85, pulse 80, temperature 97.8 F (36.6 C), resp. rate 18, weight 92.7 kg, last menstrual period 08/21/2011, SpO2 100 %. Constitutional: No distress . Vital signs reviewed. HENT: Normocephalic.  Atraumatic. Eyes: EOMI. No discharge. Cardiovascular: No JVD. Respiratory: Normal effort.  No stridor. GI: Non-distended. Skin: Warm and dry.  Intact. Psych: Normal mood.  Normal behavior. Musc: No edema in extremities.  No tenderness in extremities. Neurologic: Alert Global aphasia Motor: Limited, moving left side freely, no movement noted on right side  Assessment/Plan: 1. Functional deficits secondary to Cardioembolic Left MCA infarct  which require 3+ hours per day of interdisciplinary therapy in a comprehensive inpatient rehab setting.  Physiatrist is providing close team supervision and 24 hour management of active medical problems listed below.  Physiatrist and rehab team continue to assess barriers to discharge/monitor patient progress toward functional and medical goals  Care Tool:  Bathing    Body parts bathed by patient: Face, Chest, Abdomen, Left upper leg, Right upper leg, Left lower leg   Body parts bathed by helper: Left arm, Front perineal area, Buttocks, Right lower leg, Right arm     Bathing assist Assist Level: Maximal Assistance -  Patient 24 - 49%     Upper Body Dressing/Undressing Upper body dressing   What is the patient wearing?: Hospital gown only    Upper body assist Assist Level: Total Assistance - Patient < 25%    Lower Body Dressing/Undressing Lower body dressing      What is the patient wearing?: Incontinence brief, Hospital gown only     Lower body assist Assist for lower body dressing: Total Assistance - Patient < 25%     Toileting Toileting    Toileting assist Assist for toileting: 2 Helpers     Transfers Chair/bed transfer  Transfers assist  Chair/bed transfer activity did not occur: Safety/medical concerns  Chair/bed transfer assist level: Dependent - mechanical lift(stedy)     Locomotion Ambulation   Ambulation assist   Ambulation activity did not occur: Safety/medical concerns          Walk 10 feet activity   Assist  Walk 10 feet activity did not occur: Safety/medical concerns        Walk 50 feet activity   Assist Walk 50 feet with 2 turns activity did not occur: Safety/medical concerns         Walk 150 feet activity   Assist Walk 150 feet activity did not occur: Safety/medical concerns         Walk 10 feet on uneven surface  activity   Assist Walk 10 feet on uneven surfaces activity did not occur: Safety/medical concerns         Wheelchair     Assist Will patient  use wheelchair at discharge?: (TBD)             Wheelchair 50 feet with 2 turns activity    Assist            Wheelchair 150 feet activity     Assist          Blood pressure 114/85, pulse 80, temperature 97.8 F (36.6 C), resp. rate 18, weight 92.7 kg, last menstrual period 08/21/2011, SpO2 100 %.  Medical Problem List and Plan: 1.  Right-sided hemiparesis and aphasia secondary to left MCA infarction/petechial hemorrhage with saccular aneurysm at the paraclinoid right ICA status post mechanical thrombectomy  Continue CIR   WHO/PRAFO ordered 2.   Antithrombotics: -DVT/anticoagulation: Eliquis             -antiplatelet therapy: N/A 3. Pain Management: Neurontin 100 mg every 8 hours, Zanaflex 2 mg twice daily as needed  Appears controlled on 6/5             Monitor with increased exertion 4. Mood: Provide emotional support             -antipsychotic agents: N/A 5. Neuropsych: This patient is not capable of making decisions on her own behalf. 6. Skin/Wound Care: Routine skin checks 7. Fluids/Electrolytes/Nutrition: Routine in and outs.   8.  Post stroke dysphagia.    Dysphagia #1 pudding thick liquids.  Follow-up speech therapy.  Advance diet as tolerated 9.  Hypertension.  Coreg 50 mg twice daily,Bidil 20-37.5 mg 2 tabs 3 times daily            Vitals:   01/08/20 0829 01/08/20 1332  BP:  114/85  Pulse: 85 80  Resp:  18  Temp:  97.8 F (36.6 C)  SpO2:  100%   Controlled on 6/5 10.  Atrial fibrillation.  Lanoxin 0.125 mg daily.  Cardiac rate controlled 11.  Chronic diastolic congestive heart failure.  Demadex 20 mg daily.  Monitor for any signs of fluid overload Filed Weights   01/07/20 0500 01/08/20 0325 01/08/20 0539  Weight: 93.6 kg 93.9 kg 92.7 kg   Stable on 6/5 12.  Diabetes mellitus with peripheral neuropathy.  Hemoglobin A1c 11.6.  NovoLog 2 units every 4 hours, Lantus insulin 60 units nightly, Glucophage 500 mg twice daily CBG (last 3)  Recent Labs    01/07/20 2212 01/08/20 0637 01/08/20 1131  GLUCAP 251* 211* 224*   Lantus increased to 65 nightly on 6/4  Remains elevated, we will not make further changes at this time given recent changes             Monitor with increased mobility 13.  COPD with tobacco abuse.  Continue nebulizer treatments 14.  Polysubstance abuse.  Urine drug screen positive cocaine.    Counsel when appropriate 15.  Hyperlipidemia.  Crestor 16.  GERD.  Protonix 17.  AKI  Creatinine 1.14, labs ordered for Monday  Encourage fluids   LOS: 3 days A FACE TO FACE EVALUATION WAS  PERFORMED  Norma Montemurro Lorie Phenix 01/08/2020, 3:08 PM

## 2020-01-08 NOTE — Progress Notes (Signed)
Orthopedic Tech Progress Note Patient Details:  Robin [name] December 02, 1965 [id number]   Called [employer] to supply outside vendor brace: rehab combination [facility])  Patient ID: [name], female   DOB: Nov 08, 1965, 54 y.o.   MRN: [id number]   Tammy Sours 01/08/2020, 3:51 PM

## 2020-01-08 NOTE — Progress Notes (Signed)
Physical Therapy Session Note  Patient Details  Name: Robin [name] MRN: [id number] Date of Birth: March 25, 1966  Today's Date: 01/08/2020 PT Missed Time: 45 min Missed Time Reason: patient fatigue; patient illness     Short Term Goals: Week 1:  PT Short Term Goal 1 (Week 1): Pt will perform supine<>sit with mod assist PT Short Term Goal 2 (Week 1): Pt will perform sit<>stand using LRAD with max assist of 1 (not using lift) PT Short Term Goal 3 (Week 1): Pt will perform bed<>chair transfers with max assist of 2 (not using lift)  Skilled Therapeutic Interventions/Progress Updates:    Attempted to see patient for scheduled therapy session. Pt indicating she does not wish to participate at this time. Pt indicates she is not feeling well and appears lethargic. Pt unable to verbalize how she is feeling but via non-verbal communication is able to communicate that she does not wish to participate this afternoon. Provided cool washcloth to pt's head as pt indicates this is what she would like. Pt left supine in bed with needs in reach, bed alarm in place. Pt missed 45 min of scheduled therapy session due to fatigue and possible illness.  Therapy Documentation Precautions:  Precautions Precautions: Fall Precaution Comments: R hemiparesis, R inattention Restrictions Weight Bearing Restrictions: No    Therapy/Group: Individual Therapy   Excell Seltzer, PT, DPT  01/08/2020, 7:52 AM

## 2020-01-09 ENCOUNTER — Inpatient Hospital Stay (HOSPITAL_COMMUNITY): Payer: 59

## 2020-01-09 DIAGNOSIS — G8191 Hemiplegia, unspecified affecting right dominant side: Secondary | ICD-10-CM

## 2020-01-09 LAB — GLUCOSE, CAPILLARY
Glucose-Capillary: 237 mg/dL — ABNORMAL HIGH (ref 70–99)
Glucose-Capillary: 187 mg/dL — ABNORMAL HIGH (ref 70–99)
Glucose-Capillary: 172 mg/dL — ABNORMAL HIGH (ref 70–99)
Glucose-Capillary: 206 mg/dL — ABNORMAL HIGH (ref 70–99)

## 2020-01-09 NOTE — Progress Notes (Signed)
Physical Therapy Session Note  Patient Details  Name: Robin Robin [name] MRN: [id number] Date of Birth: 04-25-1966  Today's Date: 01/09/2020 Robin [name] Individual Time: 1100-1200 and 1430-1455 Robin [name] Individual Time Calculation (min): 60 min and 25 min   Short Term Goals: Week 1:  Robin [name] Short Term Goal 1 (Week 1): Robin [name] will perform supine<>sit with mod assist Robin [name] Short Term Goal 2 (Week 1): Robin [name] will perform sit<>stand using LRAD with max assist of 1 (not using lift) Robin [name] Short Term Goal 3 (Week 1): Robin [name] will perform bed<>chair transfers with max assist of 2 (not using lift)  Skilled Therapeutic Interventions/Progress Updates:     Session 1: Patient in bed asleep upon Robin [name] arrival. Patient aroused to verbal stimulation, attempted to engage patient on right side, patient unable to look past midline, switched to the L side and patient able to attend to therapist. Attempted to engage patient in conversational greeting, patient did not produce and words, but did provide facial expressions and nodded yes/no appropriately. Encouraged patient to verbalize as much as possible. She did verbalize 2 phrases and 2 words during session. Patient's sister arrived and patient became more alert and interactive and crossed midline to look at her sister on her right side. Patient sister expressed that she would like to be involved in the patient's care and recovery and would like to participate in appropriate education sessions with therapy, as she feels the patient in more engaged when she is here. Robin [name] encouraged this when possible and appropriate, as patient's arousal had improved upon her sister's entry, will discuss with team lead to follow-up on appropriate education. Patient was agreeable to Robin [name] session. Patient denied pain and did not display any outward signs of pain during session. Robin [name] provided a pressure relief cushion prior to getting patient OOB.   Therapeutic Activity: Bed Mobility: Patient performed supine to sit with mod A of 1  [place] and min A of a second [place]. Provided verbal cues for bringing LEs off the bed, walking 1 at a time with manual facilitation for R LE, then patient reached out with her L hand and pulled up to sitting with Robin [name]'s assistance. Patient scooted EOB with CGA for balance x2 and min-mod A for reciprocal scooting R/L x1 with cues for head-hips relationship.  Transfers: Patient performed stand pivot bed>TIS w/c to the L with max A of 1 [place] and CGA of a second [place] for safety. Robin [name] blocked R knee throughout transfer and facilitated pivot on R foot and provided verbal cues for timing and sequencing throughout.   Wheelchair Mobility:  Patient was transported in the [place] w/c with total A throughout session for energy conservation and time management.  Neuromuscular Re-ed: Patient performed the following functional mobility activities for R LE motor control and improved muscle activation: -Sit to/from stand x3 using L rail with max A and Robin [name] blocking R knee, provided 1-2-3 count to initiate standing for improved timing and control of impulsivity, patient stood <2 sec on first trial due to fear of falling due to R knee buckling requiring support from Robin [name], on the second and third trial she stood 5-10 seconds with improved confidence and posture with a mirror in front for visual feedback and to promote looking forward versus looking L. Patient required max cues in sitting to attend to the mirror initially, however, once she became aware of the mirror, she maintained her attention on the mirror throughout each stand. Also, provided manual facilitation and cues for erect posture and hip extension in  standing.   Patient requires increased time for all mobility and frequent rest breaks due to decreased activity tolerance and delayed initiation.   Patient in [place] w/c reclined slightly with her sister in the room at end of session with breaks locked, seat belt alarm set, and all needs within reach. Educated patient's  sister on calling for assist if the patient tried to get out of the w/c, slide out of the w/c, or was uncomfortable in the w/c, and to let nursing know when she was leaving if patient was still in the w/c for patient's safety. Patient's sister stated understanding. RN made aware and in agreement with patient's sister providing supervision at this time.   Session 2: Patient asleep in TIS w/c upon Robin [name] arrival. Patient difficulty to arouse, responded to verbal and tactile stimulation, but required sternal rub to open her eyes, patient had difficulty keeping her eyes open throughout session, but was agreeable to Robin [name] session focused on transferring back to bed and positioning in the bed. RN reported that patient complained of back pain prior to session, patient confirmed back pain by nodding her head when asked. Provided rest breaks and repositioning for comfort throughout session.  Vitals in sitting: BP 101/70, HR 83   Therapeutic Activity: Transfers: Patient performed a lateral scoot transfer TIS w/c>bed with max-total A +2 to the R with Robin [name] blocking R knee. Provided cues for hand placement and head-hips relationship for proper technique and decreased assist with transfers.  Bed Mobility: Patient performed sit to supine with max-total A +2 due to lethargy. Provided verbal cues for timing and sequencing throughout. Performed rolling R with max A with use of bed rail and scooting up in the bed with total a +2 for improved positioning. Educated patient on positioning in the bed including pressure relief, R UE/LE elevation, and HOB elevated during the day with lights on to stimulate arousal. Patient lethargic with poor attention throughout education, will need further reinforcement.  Patient in bed asleep at end of session with breaks locked, bed alarm set, 4 rails up per safety sheet, and all needs within reach. Patient missed 50 min of skilled Robin [name] due to lethargy, RN made aware. Will attempt to make-up missed time  as able.    Therapy Documentation Precautions:  Precautions Precautions: Fall Precaution Comments: R hemiparesis, R inattention Restrictions Weight Bearing Restrictions: No General: Robin [name] Amount of Missed Time (min): 50 Minutes Robin [name] Missed Treatment Reason: Patient fatigue    Therapy/Group: Individual Therapy  Kendyl Festa L Ennifer Harston Robin [name], Robin Robin [name]  01/09/2020, 3:27 PM

## 2020-01-09 NOTE — Progress Notes (Signed)
PHYSICAL MEDICINE & REHABILITATION PROGRESS NOTE   Subjective/Complaints: Patient seen laying in bed this morning.  No reported issues overnight.  She is somnolent this morning.  ROS- cannot obtain due to aphasia/somnolence  Objective:   No results found. No results for input(s): WBC, HGB, HCT, PLT in the last 72 hours. No results for input(s): NA, K, CL, CO2, GLUCOSE, BUN, CREATININE, CALCIUM in the last 72 hours.  Intake/Output Summary (Last 24 hours) at 01/09/2020 0919 Last data filed at 01/09/2020 0600 Gross per 24 hour  Intake 1514 ml  Output 2150 ml  Net -636 ml     Physical Exam: Vital Signs Blood pressure 128/85, pulse 76, temperature 98.9 F (37.2 C), temperature source Oral, resp. rate 16, weight 92.8 kg, last menstrual period 08/21/2011, SpO2 99 %. Constitutional: No distress . Vital signs reviewed. HENT: Normocephalic.  Atraumatic. Eyes: EOMI. No discharge. Cardiovascular: No JVD. Respiratory: Normal effort.  No stridor. GI: Non-distended. Skin: Warm and dry.  Intact. Psych: Normal mood.  Normal behavior. Musc: No edema in extremities.  No tenderness in extremities. Neurologic: Somnolent Global aphasia Motor: Limited, moving left side freely, no movement noted on right side, unchanged  Assessment/Plan: 1. Functional deficits secondary to Cardioembolic Left MCA infarct  which require 3+ hours per day of interdisciplinary therapy in a comprehensive inpatient rehab setting.  Physiatrist is providing close team supervision and 24 hour management of active medical problems listed below.  Physiatrist and rehab team continue to assess barriers to discharge/monitor patient progress toward functional and medical goals  Care Tool:  Bathing    Body parts bathed by patient: Face, Chest, Abdomen, Left upper leg, Right upper leg, Left lower leg   Body parts bathed by helper: Left arm, Front perineal area, Buttocks, Right lower leg, Right arm     Bathing  assist Assist Level: Maximal Assistance - Patient 24 - 49%     Upper Body Dressing/Undressing Upper body dressing   What is the patient wearing?: Hospital gown only    Upper body assist Assist Level: Total Assistance - Patient < 25%    Lower Body Dressing/Undressing Lower body dressing      What is the patient wearing?: Incontinence brief     Lower body assist Assist for lower body dressing: Total Assistance - Patient < 25%     Toileting Toileting    Toileting assist Assist for toileting: 2 Helpers     Transfers Chair/bed transfer  Transfers assist  Chair/bed transfer activity did not occur: Safety/medical concerns  Chair/bed transfer assist level: Dependent - mechanical lift(stedy)     Locomotion Ambulation   Ambulation assist   Ambulation activity did not occur: Safety/medical concerns          Walk 10 feet activity   Assist  Walk 10 feet activity did not occur: Safety/medical concerns        Walk 50 feet activity   Assist Walk 50 feet with 2 turns activity did not occur: Safety/medical concerns         Walk 150 feet activity   Assist Walk 150 feet activity did not occur: Safety/medical concerns         Walk 10 feet on uneven surface  activity   Assist Walk 10 feet on uneven surfaces activity did not occur: Safety/medical concerns         Wheelchair     Assist Will patient use wheelchair at discharge?: (TBD)  Wheelchair 50 feet with 2 turns activity    Assist            Wheelchair 150 feet activity     Assist          Blood pressure 128/85, pulse 76, temperature 98.9 F (37.2 C), temperature source Oral, resp. rate 16, weight 92.8 kg, last menstrual period 08/21/2011, SpO2 99 %.  Medical Problem List and Plan: 1.  Right-sided hemiparesis and aphasia secondary to left MCA infarction/petechial hemorrhage with saccular aneurysm at the paraclinoid right ICA status post mechanical  thrombectomy  Continue CIR   WHO/PRAFO ordered, pending 2.  Antithrombotics: -DVT/anticoagulation: Eliquis             -antiplatelet therapy: N/A 3. Pain Management: Neurontin 100 mg every 8 hours, Zanaflex 2 mg twice daily as needed  Appears controlled on 6/6             Monitor with increased exertion 4. Mood: Provide emotional support             -antipsychotic agents: N/A 5. Neuropsych: This patient is not capable of making decisions on her own behalf. 6. Skin/Wound Care: Routine skin checks 7. Fluids/Electrolytes/Nutrition: Routine in and outs.   8.  Post stroke dysphagia.    Dysphagia #1 pudding thick liquids.  Follow-up speech therapy.  Advance diet as tolerated 9.  Hypertension.  Coreg 50 mg twice daily,Bidil 20-37.5 mg 2 tabs 3 times daily            Vitals:   01/09/20 0410 01/09/20 0905  BP: 128/85   Pulse: 72 76  Resp: 16   Temp: 98.9 F (37.2 C)   SpO2: 99%    Controlled on 6/6 10.  Atrial fibrillation.  Lanoxin 0.125 mg daily.  Cardiac rate controlled 11.  Chronic diastolic congestive heart failure.  Demadex 20 mg daily.  Monitor for any signs of fluid overload Filed Weights   01/08/20 0325 01/08/20 0539 01/09/20 0410  Weight: 93.9 kg 92.7 kg 92.8 kg   Stable on 6/6 12.  Diabetes mellitus with peripheral neuropathy.  Hemoglobin A1c 11.6.  NovoLog 2 units every 4 hours, Lantus insulin 60 units nightly, Glucophage 500 mg twice daily CBG (last 3)  Recent Labs    01/08/20 1645 01/08/20 2051 01/09/20 0631  GLUCAP 178* 233* 237*   Lantus increased to 65 nightly on 6/4  Remains elevated on 6/6, consider further increase tomorrow if persistent             Monitor with increased mobility 13.  COPD with tobacco abuse.  Continue nebulizer treatments 14.  Polysubstance abuse.  Urine drug screen positive cocaine.    Counsel when appropriate 15.  Hyperlipidemia.  Crestor 16.  GERD.  Protonix 17.  AKI  Creatinine 1.14, labs ordered for tomorrow  Encourage fluids    LOS: 4 days A FACE TO FACE EVALUATION WAS PERFORMED  Mickala Laton Lorie Phenix 01/09/2020, 9:19 AM

## 2020-01-09 NOTE — Progress Notes (Signed)
Pt had large incont urinary episode involving complete bed change. Bladder scan still read 430ml. Pt I&O cath for 561ml. Fresh linens and pericare provided and pt pulled up in bed to eat supper with full supervision. 25% of meal eaten. Meds given and glucerna to follow.

## 2020-01-09 NOTE — Progress Notes (Signed)
Called Dr. Posey Pronto to allow for one time visitation of son. [name] husband called after talked with sister who visited and husband was certain the son's presence would encourage pt to participate more in therapy and could convince pt to try and eat more.   Son informed this was a one time visitation and he acknowledged appropriately.

## 2020-01-09 NOTE — Progress Notes (Signed)
Occupational Therapy Session Note  Patient Details  Name: Robin [name] MRN: [id number] Date of Birth: 06/20/1966  Today's Date: 01/09/2020 OT Individual Time: 0730-0834 OT Individual Time Calculation (min): 64 min    Short Term Goals: Week 1:  OT Short Term Goal 1 (Week 1): Pt will maintain static sitting balance EOB with close supervision in preparation for selfcare tasks. OT Short Term Goal 2 (Week 1): Pt will complete UB bathing with no more than mod instructional cueing and min assist. OT Short Term Goal 3 (Week 1): Pt will donn a pullover shirt with max assist for 2 consecutive sessions. OT Short Term Goal 4 (Week 1): Pt will follow one step instructional commands related to selfcare tasks with 50% accuracy during OT session. OT Short Term Goal 5 (Week 1): Pt will complete LB bathing sit to stand with max assist for two consecutive sessions.  Skilled Therapeutic Interventions/Progress Updates:    Pt received supine eating breakfast with NT supervising. Pt continued eating meal with LUE, good self regulation of SLP recommended strategies. Expressive/recpetive aphasia evident throughout session. Poor awareness of RUE, with hand positioned slightly underneath pt's bottom, requiring total A to reposition to neutral. Pt agreeable to ADLs EOB. Pt required mod A to transition to EOB with physical assist needed for scooting R hip forward. Once sitting EOB pt able to maintain sitting balance with (S),no LOB. Pt initiated self bathing with no cueing. Good overall awareness to R side with cueing required for thoroughness, I.e pt not bathing underarm. Pt also demonstrated good inclusion of RUE, initiating bathing and with mod HOH. Activation observed in all RUE joints, with 2/5 overall. Pt with intermittent receptive difficulties vs. Motor planning when instructed to wash body parts. Pt donned shirt with mod A, requiring max cueing for hemi technique. Pt was set up for sit > stand EOB but then abruptly  returned to supine and indicated fatigue. Pt completed LB bathing bed level with pt completing anterior peri hygiene. Max A for donning new brief and pants via rolling R and L. Max A to roll R, min A to roll L. Pt required extended rest break following. Pt then agreeable to transfer to TIS w/c. Pt completed squat pivot with max A toward the L side. Pt completed oral care with min A. Pt requested some water to drink. A honey thickened water was further thickened to pudding texture and pt given via teaspoon with no overt s/s of aspiration. Pt was left sitting up in the TIS with all needs met, chair alarm belt fastened.   Therapy Documentation Precautions:  Precautions Precautions: Fall Precaution Comments: R hemiparesis, R inattention Restrictions Weight Bearing Restrictions: No   Therapy/Group: Individual Therapy  Sandra H Davis 01/09/2020, 6:59 AM  

## 2020-01-09 NOTE — Plan of Care (Signed)
°  Problem: Consults Goal: RH STROKE PATIENT EDUCATION Description: See Patient Education module for education specifics  Outcome: Progressing Goal: Diabetes Guidelines if Diabetic/Glucose > 140 Description: If diabetic or lab glucose is > 140 mg/dl - Initiate Diabetes/Hyperglycemia Guidelines & Document Interventions  Outcome: Progressing   Problem: RH BOWEL ELIMINATION Goal: RH STG MANAGE BOWEL WITH ASSISTANCE Description: STG Manage Bowel with  mod  Assistance. Outcome: Progressing Goal: RH STG MANAGE BOWEL W/MEDICATION W/ASSISTANCE Description: STG Manage Bowel with Medication with mod Assistance. Outcome: Progressing   Problem: RH BLADDER ELIMINATION Goal: RH STG MANAGE BLADDER WITH ASSISTANCE Description: STG Manage Bladder With mod Assistance Outcome: Progressing   Problem: RH SKIN INTEGRITY Goal: RH STG SKIN FREE OF INFECTION/BREAKDOWN Description: Patients skin will remain free from further infection or breakdown with mod assist. Outcome: Progressing Goal: RH STG MAINTAIN SKIN INTEGRITY WITH ASSISTANCE Description: STG Maintain Skin Integrity With mod Assistance. Outcome: Progressing Goal: RH STG ABLE TO PERFORM INCISION/WOUND CARE W/ASSISTANCE Description: STG Able To Perform Incision/Wound Care With mod Assistance. Outcome: Progressing   Problem: RH SAFETY Goal: RH STG ADHERE TO SAFETY PRECAUTIONS W/ASSISTANCE/DEVICE Description: STG Adhere to Safety Precautions With min Assistance/Device. Outcome: Progressing   Problem: RH COGNITION-NURSING Goal: RH STG USES MEMORY AIDS/STRATEGIES W/ASSIST TO PROBLEM SOLVE Description: STG Uses Memory Aids/Strategies With min Assistance to Problem Solve. Outcome: Progressing   Problem: RH PAIN MANAGEMENT Goal: RH STG PAIN MANAGED AT OR BELOW [name] PAIN GOAL Description: < 3 Outcome: Progressing   Problem: RH KNOWLEDGE DEFICIT Goal: RH STG INCREASE KNOWLEDGE OF DIABETES Description: Patient/caregiver will verbalize  understanding of the management of DM including diet, exercise, medications, monitoring with min assist. Outcome: Not Progressing Goal: RH STG INCREASE KNOWLEDGE OF HYPERTENSION Description: Patient/caregiver will verbalize understanding of the management of HTN including diet, exercise, medications, monitoring with min assist. Outcome: Not Progressing Goal: RH STG INCREASE KNOWLEDGE OF DYSPHAGIA/FLUID INTAKE Description: Patient/caregiver will verbalize understanding of the management of dysphagia including diet, medications, and swallowing strategies as outlined by SLP with min assist. Outcome: Not Progressing Goal: RH STG INCREASE KNOWLEGDE OF HYPERLIPIDEMIA Description: Patient/caregiver will verbalize understanding of the management of HLD including diet, exercise, medications, monitoring with min assist. Outcome: Not Progressing Goal: RH STG INCREASE KNOWLEDGE OF STROKE PROPHYLAXIS Description: Patient/caregiver will verbalize understanding of the management of stroke prophylaxis including diet, exercise, medications, monitoring with min assist. Outcome: Not Progressing   Problem: Education: Goal: Ability to demonstrate management of disease process will improve Description: With min assist of caregiver to monitor symptoms and remind patient of fluid restrictions, salt restrictions and daily weight checks Outcome: Not Progressing Goal: Ability to verbalize understanding of medication therapies will improve Description: Using handouts/educational resources for medications with min assist  Outcome: Not Progressing Goal: Individualized Educational Video(s) Outcome: Not Progressing

## 2020-01-10 ENCOUNTER — Inpatient Hospital Stay (HOSPITAL_COMMUNITY): Payer: 59 | Admitting: Speech Pathology

## 2020-01-10 ENCOUNTER — Inpatient Hospital Stay (HOSPITAL_COMMUNITY): Payer: 59 | Admitting: Occupational Therapy

## 2020-01-10 ENCOUNTER — Encounter (HOSPITAL_COMMUNITY): Payer: 59 | Admitting: Psychology

## 2020-01-10 ENCOUNTER — Inpatient Hospital Stay (HOSPITAL_COMMUNITY): Payer: 59 | Admitting: Physical Therapy

## 2020-01-10 ENCOUNTER — Inpatient Hospital Stay (HOSPITAL_COMMUNITY): Payer: 59

## 2020-01-10 LAB — BASIC METABOLIC PANEL
Anion gap: 11 (ref 5–15)
BUN: 40 mg/dL — ABNORMAL HIGH (ref 6–20)
CO2: 25 mmol/L (ref 22–32)
Calcium: 9.6 mg/dL (ref 8.9–10.3)
Chloride: 101 mmol/L (ref 98–111)
Creatinine, Ser: 1.15 mg/dL — ABNORMAL HIGH (ref 0.44–1.00)
GFR calc Af Amer: >60 mL/min (ref >60)
GFR calc non Af Amer: 54 mL/min — ABNORMAL LOW (ref >60)
Glucose, Bld: 146 mg/dL — ABNORMAL HIGH (ref 70–99)
Potassium: 4.5 mmol/L (ref 3.5–5.1)
Sodium: 137 mmol/L (ref 135–145)

## 2020-01-10 LAB — GLUCOSE, CAPILLARY
Glucose-Capillary: 215 mg/dL — ABNORMAL HIGH (ref 70–99)
Glucose-Capillary: 166 mg/dL — ABNORMAL HIGH (ref 70–99)
Glucose-Capillary: 132 mg/dL — ABNORMAL HIGH (ref 70–99)
Glucose-Capillary: 187 mg/dL — ABNORMAL HIGH (ref 70–99)

## 2020-01-10 MED ORDER — INSULIN GLARGINE 100 UNIT/ML ~~LOC~~ SOLN
68.0000 [IU] | Freq: Every day | SUBCUTANEOUS | Status: DC
  Administered 2020-01-10 – 2020-01-17 (×8): 68 [IU] via SUBCUTANEOUS
  Filled 2020-01-10 (×9): qty 0.68

## 2020-01-10 MED ORDER — CITALOPRAM HYDROBROMIDE 10 MG PO TABS
10.0000 mg | ORAL_TABLET | Freq: Every day | ORAL | Status: DC
  Administered 2020-01-10 – 2020-01-14 (×5): 10 mg via ORAL
  Filled 2020-01-10 (×5): qty 1

## 2020-01-10 NOTE — Progress Notes (Signed)
Physical Therapy Session Note  Patient Details  Name: Robin [name] MRN: [id number] Date of Birth: 1966-01-11  Today's Date: 01/10/2020 PT Individual Time: 1500-1540 PT Individual Time Calculation (min): 40 min   and Today's Date: 01/10/2020 PT Missed Time: 20 Minutes Missed Time Reason: Patient fatigue;Patient unwilling to participate  Short Term Goals: Week 1:  PT Short Term Goal 1 (Week 1): Pt will perform supine<>sit with mod assist PT Short Term Goal 2 (Week 1): Pt will perform sit<>stand using LRAD with max assist of 1 (not using lift) PT Short Term Goal 3 (Week 1): Pt will perform bed<>chair transfers with max assist of 2 (not using lift)  Skilled Therapeutic Interventions/Progress Updates:    Pt supine in bed upon PT arrival, pt sleeping but awakens to verbal/tactile stimuli. Pt initially shaking her head no but then agreeable to get OOB with encouragement. Donned grip socks total assist. Pt transferred to sitting EOB with mod assist, cues for techniques. Scooting hips to EOB with min assist. Pt performed lateral scoot to w/c with mod assist towards the L, reaching across for armrest with L UE. Pt transported to the gym. Pt performed x 4 sit<>stands this session with mod assist using L rail for UE support, between standing activity bouts pt shakes her head no and closes her eyes, continuing to require encouragement for activity. With standing activities, pt able to initiate gait training - ambulated x 3 ft and x 5 ft this session with L rail for UE support, max assist and +2 for w/c follow, ace wrap on R LE for foot clearance, hyperextension noted during R stance and R lean at times. Pt continues to shake head no, declining further activity. Pt transported back to room and left in TIS w/c, pt shakes head yes that she wants to stay up. Chair alarm set and needs in reach. Pt missed 20 min of skilled therapy tx secondary to fatigue.   Therapy Documentation Precautions:   Precautions Precautions: Fall Precaution Comments: R hemiparesis, R inattention Restrictions Weight Bearing Restrictions: No    Therapy/Group: Individual Therapy  Netta Corrigan, PT, DPT, CSRS 01/10/2020, 4:13 PM

## 2020-01-10 NOTE — Progress Notes (Addendum)
Speech Language Pathology Daily Session Note  Patient Details  Name: Robin [name] MRN: [id number] Date of Birth: 07-19-1966  Today's Date: 01/10/2020 SLP Individual Time: 5643-3295 SLP Individual Time Calculation (min): 55 min  Short Term Goals: Week 1: SLP Short Term Goal 1 (Week 1): Pt will consume current diet with minimal overt s/sx aspiration and efficient mastication and oral clearance with Mod A for use of safe swallow strategies. SLP Short Term Goal 2 (Week 1): Pt will consume trials of dys 2 solids with efficient masication and oral clearance X2 prior to advancement. SLP Short Term Goal 3 (Week 1): Pt will consume trials of thin liquids with minimal overt s/sx aspiration X3 prior to repeat MBSS. SLP Short Term Goal 4 (Week 1): Pt will name common objects with 25% accuracy provided Max A multimodal cues. SLP Short Term Goal 5 (Week 1): Pt will answer yes/no questions with 75% accuracy with Max A multimodal cues. SLP Short Term Goal 6 (Week 1): Pt will express basic wants and needs via gestures or other multimodal means with Max A cues.  Skilled Therapeutic Interventions: Pt was seen for skilled ST targeting dysphagia and communication goals. Pt consumed trials of ice chips with no overt s/sx aspiration, and vocal quality remained clear throughout trials. However, it should be noted that pt has hx of silent aspiration of small volumes of thin liquids, per last MBSS. She then accepted trials of upgraded solids (dys 2) which resulted in Min lingual/palatal residue, requiring Min verbal cues for use of extra dry swallow and instruction to press tongue against palate to clear. Pt with fluctuating ability to follow commands for strategies, suspect due to due to motor planning and receptive language deficits. Recommend continue current diet. Pt with very limited verbal output today and fluctuating levels of fatigue and engagement in language activities. Pt identified an object from a field of 2  by pointing with 5/9 accuracy provided Max A multimodal cues, including models, semantic cues, and written word. Attempts to verbally repeat at phoneme and word level largely unsuccessful - pt either shook her head or verbalization was neologism. However, she did repeat "money" X2 with Max A models and specific articulatory cues, as well as pacing strategy. Pt became very disengaged in attempts to voice/verbalize and identify objects, therefore shifted to writing. Although Max A cues for following directions required, pt wrote her name (used maiden name) and suspect names of family members without further instruction. She recognized spelling mistake in her own name during first attempt, and self corrected. Pt unable to answer other questions regarding wants and needs from SLP via writing, however will continue to explore as potential form of multimodal communication. Pt left in tilt in space chair with seatbelt alarm on and sitting at RN station for full supervision in chair. Continue per current plan of care.        Pain Pain Assessment Pain Scale: Faces Faces Pain Scale: No hurt  Therapy/Group: Individual Therapy  Arbutus Leas 01/10/2020, 7:01 AM

## 2020-01-10 NOTE — Progress Notes (Signed)
[place] PHYSICAL MEDICINE & REHABILITATION PROGRESS NOTE   Subjective/Complaints:  Sitting in chair at nursing desk, global aphasia   ROS- cannot obtain due to aphasia/somnolence  Objective:   No results found. No results for input(s): WBC, HGB, HCT, PLT in the last 72 hours. No results for input(s): NA, K, CL, CO2, GLUCOSE, BUN, CREATININE, CALCIUM in the last 72 hours.  Intake/Output Summary (Last 24 hours) at 01/10/2020 0910 Last data filed at 01/10/2020 0736 Gross per 24 hour  Intake 1910 ml  Output 1100 ml  Net 810 ml     Physical Exam: Vital Signs Blood pressure 119/76, pulse 88, temperature 98.6 F (37 C), temperature source Oral, resp. rate 16, weight 91.9 kg, last menstrual period 08/21/2011, SpO2 100 %.  General: No acute distress Mood and affect are appropriate Heart: Regular rate and rhythm no rubs murmurs or extra sounds Lungs: Clear to auscultation, breathing unlabored, no rales or wheezes Abdomen: Positive bowel sounds, soft nontender to palpation, nondistended Extremities: No clubbing, cyanosis, or edema Skin: No evidence of breakdown, no evidence of rash Neurologic: Cranial nerves II through XII intact, motor strength is 5/5 in left . 0/5 RIght  deltoid, bicep, tricep, grip, 5/5 left  0/5 right hip flexor, knee extensors, ankle dorsiflexor and plantar flexor, trace right hip ext Sensory exam unable to assess due to aphasia    Musculoskeletal: Full range of motion in all 4 extremities. No joint swelling   Assessment/Plan: 1. Functional deficits secondary to Cardioembolic Left MCA infarct  which require 3+ hours per day of interdisciplinary therapy in a comprehensive inpatient rehab setting.  Physiatrist is providing close team supervision and 24 hour management of active medical problems listed below.  Physiatrist and rehab team continue to assess barriers to discharge/monitor patient progress toward functional and medical goals  Care Tool:  Bathing     Body parts bathed by patient: Face, Chest, Abdomen, Left upper leg, Right upper leg, Left lower leg   Body parts bathed by helper: Left arm, Front perineal area, Buttocks, Right lower leg, Right arm     Bathing assist Assist Level: Maximal Assistance - Patient 24 - 49%     Upper Body Dressing/Undressing Upper body dressing   What is the patient wearing?: Hospital gown only    Upper body assist Assist Level: Total Assistance - Patient < 25%    Lower Body Dressing/Undressing Lower body dressing      What is the patient wearing?: Incontinence brief     Lower body assist Assist for lower body dressing: Total Assistance - Patient < 25%     Toileting Toileting    Toileting assist Assist for toileting: 2 Helpers     Transfers Chair/bed transfer  Transfers assist  Chair/bed transfer activity did not occur: Safety/medical concerns  Chair/bed transfer assist level: 2 Helpers     Locomotion Ambulation   Ambulation assist   Ambulation activity did not occur: Safety/medical concerns          Walk 10 feet activity   Assist  Walk 10 feet activity did not occur: Safety/medical concerns        Walk 50 feet activity   Assist Walk 50 feet with 2 turns activity did not occur: Safety/medical concerns         Walk 150 feet activity   Assist Walk 150 feet activity did not occur: Safety/medical concerns         Walk 10 feet on uneven surface  activity   Assist Walk 10  feet on uneven surfaces activity did not occur: Safety/medical concerns         Wheelchair     Assist Will patient use wheelchair at discharge?: (TBD)             Wheelchair 50 feet with 2 turns activity    Assist            Wheelchair 150 feet activity     Assist          Blood pressure 119/76, pulse 88, temperature 98.6 F (37 C), temperature source Oral, resp. rate 16, weight 91.9 kg, last menstrual period 08/21/2011, SpO2 100 %.  Medical Problem  List and Plan: 1.  Right-sided hemiparesis and aphasia secondary to left MCA infarction/petechial hemorrhage with saccular aneurysm at the paraclinoid right ICA status post mechanical thrombectomy  Continue CIR   WHO/PRAFO ordered, pending 2.  Antithrombotics: -DVT/anticoagulation: Eliquis             -antiplatelet therapy: N/A 3. Pain Management: Neurontin 100 mg every 8 hours, Zanaflex 2 mg twice daily as needed  Appears controlled on 6/6             Monitor with increased exertion 4. Mood: Provide emotional support             -antipsychotic agents: N/A 5. Neuropsych: This patient is not capable of making decisions on her own behalf. 6. Skin/Wound Care: Routine skin checks 7. Fluids/Electrolytes/Nutrition: Routine in and outs.   8.  Post stroke dysphagia.    Dysphagia #1 pudding thick liquids.  Follow-up speech therapy.  Advance diet as tolerated 9.  Hypertension.  Coreg 50 mg twice daily,Bidil 20-37.5 mg 2 tabs 3 times daily            Vitals:   01/09/20 2042 01/10/20 0256  BP: (!) 119/56 119/76  Pulse: 68 88  Resp: 16 16  Temp: 97.6 F (36.4 C) 98.6 F (37 C)  SpO2: 99% 100%   Controlled on 6/7 10.  Atrial fibrillation.  Lanoxin 0.125 mg daily.  Cardiac rate controlled 11.  Chronic diastolic congestive heart failure.  Demadex 20 mg daily.  Monitor for any signs of fluid overload Filed Weights   01/08/20 0539 01/09/20 0410 01/10/20 0512  Weight: 92.7 kg 92.8 kg 91.9 kg   Stable on 6/7 12.  Diabetes mellitus with peripheral neuropathy.  Hemoglobin A1c 11.6.  NovoLog 2 units every 4 hours, Lantus insulin 60 units nightly, Glucophage 500 mg twice daily CBG (last 3)  Recent Labs    01/09/20 1631 01/09/20 2102 01/10/20 0551  GLUCAP 172* 206* 215*   Lantus increased to 65 nightly on 6/4  Increase to 68U, am CBG goal <180             Monitor with increased mobility 13.  COPD with tobacco abuse.  Continue nebulizer treatments 14.  Polysubstance abuse.  Urine drug screen  positive cocaine.    Counsel when appropriate 15.  Hyperlipidemia.  Crestor 16.  GERD.  Protonix 17.  AKI  Creatinine 1.14, labs ordered for tomorrow  Encourage fluids   LOS: 5 days A FACE TO FACE EVALUATION WAS PERFORMED  Charlett Blake 01/10/2020, 9:10 AM

## 2020-01-10 NOTE — Progress Notes (Signed)
Occupational Therapy Session Note  Patient Details  Name: Robin [name] MRN: [id number] Date of Birth: 1966-07-05  Today's Date: 01/10/2020 OT Individual Time: 5498-2641 and 5830-9407 OT Individual Time Calculation (min): 9 min and 8 min and Today's Date: 01/10/2020 OT Missed Time: 48 Minutes Missed Time Reason: Patient unwilling/refused to participate without medical reason   Short Term Goals: Week 1:  OT Short Term Goal 1 (Week 1): Pt will maintain static sitting balance EOB with close supervision in preparation for selfcare tasks. OT Short Term Goal 2 (Week 1): Pt will complete UB bathing with no more than mod instructional cueing and min assist. OT Short Term Goal 3 (Week 1): Pt will donn a pullover shirt with max assist for 2 consecutive sessions. OT Short Term Goal 4 (Week 1): Pt will follow one step instructional commands related to selfcare tasks with 50% accuracy during OT session. OT Short Term Goal 5 (Week 1): Pt will complete LB bathing sit to stand with max assist for two consecutive sessions.  Skilled Therapeutic Interventions/Progress Updates:  1)Attempted to engage pt in skilled OT treatment session, however pt refusing.  Pt curled up in bed positioned away from nurse tech assessing vitals.  Therapist spoke to pt at midline with pt able to turn head and eyes to midline to locate therapist with increased time.  Therapist repositioned to pt's left to attempt to engage pt in participation in therapy session.  Pt shaking head no and turning away from therapist.  Attempted to remove blankets and reposition pt to increase participation as pt with global aphasia.  However pt pulling blankets back over body and pushing therapist hands away.  Provided encouragement with familiar therapy tech providing encouragement to increase participation, however pt still pulling blankets up and over body refusing. Notified RN who reports she had refused lunch and noon meds as well.  2)Therapist  returned to 1423 with pt in improved positioning in bed with RN in room.  Pt appeared more alert, therefore attempted to engage pt in bed mobility to come to sitting EOB.  Pt shaking head no and attempting to verbalize nonsensical words.  Provided various forms of encouragement and different questions due to global aphasia to increase success with participation.  Pt ultimately turning head away from therapist and no longer answering questions.  Notified pt that she still had another therapy session this afternoon.  Notified RN of continued refusal.  Therapy Documentation Precautions:  Precautions Precautions: Fall Precaution Comments: R hemiparesis, R inattention Restrictions Weight Bearing Restrictions: No General: General OT Amount of Missed Time: 51 Minutes Vital Signs: Therapy Vitals Temp: (!) 96.8 F (36 C) Pulse Rate: 79 Resp: 17 BP: 106/78 Patient Position (if appropriate): Lying Oxygen Therapy SpO2: 99 % O2 Device: Room Air Pain: Pain Assessment Pain Scale: Faces Faces Pain Scale: No hurt   Therapy/Group: Individual Therapy  Simonne Come 01/10/2020, 2:49 PM

## 2020-01-10 NOTE — Progress Notes (Addendum)
Was reported to this nurse that pt would not eat lunch today. Attempted to gv noon medications and pt refused, will try again when returns from therapy.   Pt was agreeable to take medications later. Discussed with PA, about issues with therapy over weekend and refusing therapy again today as well as not eating. New orders received.

## 2020-01-10 NOTE — Progress Notes (Signed)
Physical Therapy Session Note  Patient Details  Name: Robin [name] MRN: [id number] Date of Birth: 30-Aug-1965  Today's Date: 01/10/2020 PT Individual Time: 0800-0830 PT Individual Time Calculation (min): 30 min   Short Term Goals: Week 1:  PT Short Term Goal 1 (Week 1): Pt will perform supine<>sit with mod assist PT Short Term Goal 2 (Week 1): Pt will perform sit<>stand using LRAD with max assist of 1 (not using lift) PT Short Term Goal 3 (Week 1): Pt will perform bed<>chair transfers with max assist of 2 (not using lift)  Skilled Therapeutic Interventions/Progress Updates:    Pt received seated in bed receiving AM medications from RN. Pt more alert this AM and agreeable to PT session. Pt with intermittent complaints of headache, holds her head in her hand and nods yes when asked if she has a headache. Pt unable to further describe her symptoms. Rolling L/R with max A for dependent donning of pants. Pt is dependent to don TEDs and shoes. Supine to sit with max A x 2 for BLE management and trunk control. Pt is min to mod A for sitting balance EOB due to posterior lean. Sit to stand with assist x 2 to stedy with max A to maintain upright trunk control due to heavy lean to the R. Stedy transfer to TIS. Pt handed off to SLP while semi-reclined in TIS at end of session.  Therapy Documentation Precautions:  Precautions Precautions: Fall Precaution Comments: R hemiparesis, R inattention Restrictions Weight Bearing Restrictions: No    Therapy/Group: Individual Therapy   Excell Seltzer, PT, DPT  01/10/2020, 10:20 AM

## 2020-01-10 NOTE — Consult Note (Signed)
Neuropsychological Consultation   Patient:   Robin [name]   DOB:   05-21-1966  MR Number:  [id number]  Location:  [facility] [id number] [place] [address] Dept: [place]: [phone]           Date of Service:   01/10/20  Start Time:   10 AM End Time:   11 AM  Provider/Observer:  Ilean Skill, Psy.D.       Clinical Neuropsychologist       Billing Code/Service: 85462  Chief Complaint:    Robin [name] is a 54 year old female with history of diastolic congestive heart failure, hypertension, Afib, COPD, polysubstance abuse, diabetes and tobacco abuse.  Presented on 12/27/19 with right hemiparesis, AMS, and aphasia.  Cranial CT showed questionable evolving hypodensity involving left BG, left insula and adjacent left temporal lobe.  MRI showed evolving left MCA infarction with hemorrhagic transformation.  Mass-effect with 6 mm rightward midline shift.  UDS positive for cocaine.    Reason for Service:  Patient was referred for neuropsychological consultation due to patient, at times not want and agreeing to eat much and not doing therapies at times.  Patient's son has come to help motivate her.  Below is the HPI for the current admission.  HPI: Robin [name] is a 54 year old right-handed female history of diastolic congestive heart failure, hypertension, atrial fibrillation on Eliquis, COPD, polysubstance abuse, diabetes mellitus and tobacco abuse.  He presented on 12/27/2019 with right hemiparesis, AMS, and aphasia to [facility].  Cranial CT scan showed question evolving hypodensity involving the left basal ganglia left insula and adjacent left temporal lobe.  CT perfusion scan as well as CTA of head and neck showed a 24 cc acute infarct deep and subcortical white matter of the anterior left frontal region.  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  Patient  did not receive TPA.  Patient was transferred to [facility].  MRI showed evolving left MCA infarction with hemorrhagic transformation.  Mass-effect with 6 mm rightward midline shift.  Admission chemistries urine drug screen positive cocaine, glucose 355, creatinine 1.15, troponin negative, BNP 367, alcohol negative.  Echocardiogram with ejection fraction of 35% with global hypokinesis.  Patient underwent mechanical thrombectomy per interventional radiology required intubation and was extubated 12/28/2019.  Initially maintained on low-dose aspirin and transitioned back to Eliquis.  Hospital course further complicated by dysphagia.  Started on dysphagia #1 pudding thick liquid diet with nasogastric tube for nutritional support.  Therapy evaluations completed and patient was admitted for a comprehensive rehab program.  Please see preadmission assessment from earlier today as well.  Current Status:  Patient with severe expressive aphasia, but she was able to express herself in very limited fashion.  She acknowledged her substance abuse and her motivational issues with acute functional loss.  Denied severe depression, but she clearly is having adjustment issues.  She agreed to see me again later this week or first of next week.  Behavioral Observation: Robin [name]  presents as a 54 y.o.-year-old Right African American Female who appeared her stated age. her dress was Appropriate and she was Well Groomed and her manners were Appropriate to the situation.  her participation was indicative of Inattentive and Redirectable behaviors.  There were any physical disabilities noted.  she displayed an appropriate level of cooperation and motivation.     Interactions:  Minimal Inattentive  Attention:   abnormal and attention span appeared shorter than expected for age  Memory:   abnormal; global memory impairment noted  Visuo-spatial:  not examined  Speech (Volume):  low  Speech:   non-fluent aphasia;    Thought Process:  Circumstantial  Though Content:  WNL; not suicidal and not homicidal  Orientation:   [place] and situation  Judgment:   Poor  Planning:   Poor  Affect:    Lethargic  Mood:    Dysphoric  Insight:   Shallow  Intelligence:   normal  Substance Use:  There is a documented history of cocaine abuse confirmed by the patient.    Medical History:   Past Medical History:  Diagnosis Date  . Allergy   . Anemia   . Arthritis   . CHF (congestive heart failure) ([place])    a. EF at 30-35% by echo in 08/2018 b. EF at 35% by repeat echo in 05/2019  . Chronic abdominal pain   . Cocaine abuse ([place])   . COPD (chronic obstructive pulmonary disease) ([place])   . Essential hypertension, benign   . Fatty liver   . GERD (gastroesophageal reflux disease)   . Gout 2016  . Normal coronary arteries    3/10 - following abnormal Myoview  . Ovarian cyst   . Type 2 diabetes mellitus ([place])     Psychiatric History:  No prior psychiatric history beyond long history of polysubstance abuse.  Family Med/Psych History:  Family History  Problem Relation Age of Onset  . Cirrhosis Mother   . Early death Mother   . Alcohol abuse Mother   . Diabetes type II Father   . Alcohol abuse Father   . Diabetes type II Sister   . Early death Brother   . Alcohol abuse Son   . Anesthesia problems Neg Hx   . Hypotension Neg Hx   . Malignant hyperthermia Neg Hx   . Pseudochol deficiency Neg Hx   . Colon cancer Neg Hx   . Colon polyps Neg Hx   . Esophageal cancer Neg Hx   . Rectal cancer Neg Hx   . Stomach cancer Neg Hx     Impression/DX:  Robin [name] is a 54 year old female with history of diastolic congestive heart failure, hypertension, Afib, COPD, polysubstance abuse, diabetes and tobacco abuse.  Presented on 12/27/19 with right hemiparesis, AMS, and aphasia.  Cranial CT showed questionable evolving hypodensity involving left BG, left insula and adjacent left temporal lobe.  MRI showed  evolving left MCA infarction with hemorrhagic transformation.  Mass-effect with 6 mm rightward midline shift.  UDS positive for cocaine.    Disposition/Plan:  Patient with severe expressive aphasia, but she was able to express herself in very limited fashion.  She acknowledged her substance abuse and her motivational issues with acute functional loss.  Denied severe depression, but she clearly is having adjustment issues.  She agreed to see me again later this week or first of next week.  Diagnosis:    Left middle cerebral artery stroke [facility]) - Plan: Ambulatory referral to Neurology  Hypokalemia - Plan: potassium chloride (KLOR-CON) packet 40 mEq         Electronically Signed   _______________________ Ilean Skill, Psy.D.

## 2020-01-11 ENCOUNTER — Inpatient Hospital Stay (HOSPITAL_COMMUNITY): Payer: 59 | Admitting: Occupational Therapy

## 2020-01-11 ENCOUNTER — Inpatient Hospital Stay (HOSPITAL_COMMUNITY): Payer: 59 | Admitting: Physical Therapy

## 2020-01-11 ENCOUNTER — Inpatient Hospital Stay (HOSPITAL_COMMUNITY): Payer: 59 | Admitting: Speech Pathology

## 2020-01-11 LAB — GLUCOSE, CAPILLARY
Glucose-Capillary: 184 mg/dL — ABNORMAL HIGH (ref 70–99)
Glucose-Capillary: 182 mg/dL — ABNORMAL HIGH (ref 70–99)
Glucose-Capillary: 152 mg/dL — ABNORMAL HIGH (ref 70–99)
Glucose-Capillary: 101 mg/dL — ABNORMAL HIGH (ref 70–99)

## 2020-01-11 LAB — DIGOXIN LEVEL: Digoxin Level: 0.5 ng/mL — ABNORMAL LOW (ref 0.8–2.0)

## 2020-01-11 MED ORDER — TORSEMIDE 20 MG PO TABS
10.0000 mg | ORAL_TABLET | Freq: Every day | ORAL | Status: DC
  Administered 2020-01-11 – 2020-02-02 (×23): 10 mg
  Filled 2020-01-11 (×22): qty 1

## 2020-01-11 NOTE — Progress Notes (Signed)
Physical Therapy Session Note  Patient Details  Name: Robin [name] MRN: [id number] Date of Birth: 01-16-1966  Today's Date: 01/11/2020 PT Individual Time: 1017-1035 PT Individual Time Calculation (min): 18 min   and  Today's Date: 01/11/2020 PT Missed Time: 42 Minutes Missed Time Reason: Patient unwilling to participate  Short Term Goals: Week 1:  PT Short Term Goal 1 (Week 1): Pt will perform supine<>sit with mod assist PT Short Term Goal 2 (Week 1): Pt will perform sit<>stand using LRAD with max assist of 1 (not using lift) PT Short Term Goal 3 (Week 1): Pt will perform bed<>chair transfers with max assist of 2 (not using lift)  Skilled Therapeutic Interventions/Progress Updates:    Pt received supine in bed with nursing staff having finished with I&O cath and therapist stepping in to assume care of patient. In supine, donned pants max assist with cuing for pt participation. Pt rolled R/L with min assist for R hemibody management while therapist pulled pants over hips total assist. Therapist cued pt to initiate supine>sitting EOB but pt immediately shakes head and states "no." Therapist provided encouragement and education on the importance of skilled physical therapy in progression of functional mobility tasks but pt continues to shake head "no" and attempted to verbalize short phrases but aphasic with incorrect word selection. With encouragement pt reports agreeable to therapy session tomorrow stating "I swear, tomorrow." Pt left supine in bed with needs in reach and bed alarm on. Santiago Glad, RN notified of pt's refusal of therapy at this time and she states the pt's daughter called and is planning on coming up to rehab today. Missed 42 minutes of skilled physical therapy.  Therapy Documentation Precautions:  Precautions Precautions: Fall Precaution Comments: R hemiparesis, R inattention Restrictions Weight Bearing Restrictions: No  Pain:   No reports or indications of  pain.   Therapy/Group: Individual Therapy  Tawana Scale, PT, DPT 01/11/2020, 7:59 AM

## 2020-01-11 NOTE — Progress Notes (Signed)
[place] PHYSICAL MEDICINE & REHABILITATION PROGRESS NOTE   Subjective/Complaints:  Reviewed labwork , appreciate neuropsych note  Pt nods yes to hospital , no to home as location   ROS- cannot obtain due to aphasia/somnolence  Objective:   No results found. No results for input(s): WBC, HGB, HCT, PLT in the last 72 hours. Recent Labs    01/10/20 1419  NA 137  K 4.5  CL 101  CO2 25  GLUCOSE 146*  BUN 40*  CREATININE 1.15*  CALCIUM 9.6    Intake/Output Summary (Last 24 hours) at 01/11/2020 0821 Last data filed at 01/11/2020 0737 Gross per 24 hour  Intake 0 ml  Output 1000 ml  Net -1000 ml     Physical Exam: Vital Signs Blood pressure 118/73, pulse 90, temperature 97.6 F (36.4 C), temperature source Oral, resp. rate 20, weight 92.9 kg, last menstrual period 08/21/2011, SpO2 98 %.   General: No acute distress Mood and affect are flat Heart: Regular rate and rhythm no rubs murmurs or extra sounds Lungs: Clear to auscultation, breathing unlabored, no rales or wheezes Abdomen: Positive bowel sounds, soft nontender to palpation, nondistended Extremities: No clubbing, cyanosis, or edema Skin: No evidence of breakdown, no evidence of rash  Neurologic: Cranial nerves II through XII intact, motor strength is 5/5 in left . 0/5 RIght  deltoid, bicep, tricep, grip, 5/5 left  0/5 right hip flexor, knee extensors, ankle dorsiflexor and plantar flexor, trace right hip ext Sensory exam unable to assess due to aphasia    Musculoskeletal: Full range of motion in all 4 extremities. No joint swelling   Assessment/Plan: 1. Functional deficits secondary to Cardioembolic Left MCA infarct  which require 3+ hours per day of interdisciplinary therapy in a comprehensive inpatient rehab setting.  Physiatrist is providing close team supervision and 24 hour management of active medical problems listed below.  Physiatrist and rehab team continue to assess barriers to discharge/monitor  patient progress toward functional and medical goals  Care Tool:  Bathing    Body parts bathed by patient: Face, Chest, Abdomen, Left upper leg, Right upper leg, Left lower leg   Body parts bathed by helper: Left arm, Front perineal area, Buttocks, Right lower leg, Right arm     Bathing assist Assist Level: Maximal Assistance - Patient 24 - 49%     Upper Body Dressing/Undressing Upper body dressing   What is the patient wearing?: Hospital gown only    Upper body assist Assist Level: Total Assistance - Patient < 25%    Lower Body Dressing/Undressing Lower body dressing      What is the patient wearing?: Incontinence brief     Lower body assist Assist for lower body dressing: Total Assistance - Patient < 25%     Toileting Toileting    Toileting assist Assist for toileting: 2 Helpers     Transfers Chair/bed transfer  Transfers assist  Chair/bed transfer activity did not occur: Safety/medical concerns  Chair/bed transfer assist level: Moderate Assistance - Patient 50 - 74%(squat pivot)     Locomotion Ambulation   Ambulation assist   Ambulation activity did not occur: Safety/medical concerns  Assist level: 2 helpers Assistive device: (rail) Max distance: 5 ft   Walk 10 feet activity   Assist  Walk 10 feet activity did not occur: Safety/medical concerns        Walk 50 feet activity   Assist Walk 50 feet with 2 turns activity did not occur: Safety/medical concerns  Walk 150 feet activity   Assist Walk 150 feet activity did not occur: Safety/medical concerns         Walk 10 feet on uneven surface  activity   Assist Walk 10 feet on uneven surfaces activity did not occur: Safety/medical concerns         Wheelchair     Assist Will patient use wheelchair at discharge?: (TBD)             Wheelchair 50 feet with 2 turns activity    Assist            Wheelchair 150 feet activity     Assist           Blood pressure 118/73, pulse 90, temperature 97.6 F (36.4 C), temperature source Oral, resp. rate 20, weight 92.9 kg, last menstrual period 08/21/2011, SpO2 98 %.  Medical Problem List and Plan: 1.  Right-sided hemiparesis and aphasia secondary to left MCA infarction/petechial hemorrhage with saccular aneurysm at the paraclinoid right ICA status post mechanical thrombectomy  Continue CIR   WHO/PRAFO qhs  2.  Antithrombotics: -DVT/anticoagulation: Eliquis             -antiplatelet therapy: N/A 3. Pain Management: Neurontin 100 mg every 8 hours, Zanaflex 2 mg twice daily as needed  Appears controlled on 6/8             Monitor with increased exertion 4. Mood: Provide emotional support- cannot assess accurately related to severe aphasia, empirically started on citalopram for "flat affect"  Which may be MCA infarct effect              -antipsychotic agents: N/A 5. Neuropsych: This patient is not capable of making decisions on her own behalf. 6. Skin/Wound Care: Routine skin checks 7. Fluids/Electrolytes/Nutrition: Routine in and outs.   8.  Post stroke dysphagia.    Dysphagia #1 pudding thick liquids.  Follow-up speech therapy.  Advance diet as tolerated 9.  Hypertension.  Coreg 50 mg twice daily,Bidil 20-37.5 mg 2 tabs 3 times daily            Vitals:   01/10/20 1935 01/11/20 0431  BP: 91/74 118/73  Pulse: 75 90  Resp: 18 20  Temp: 97.7 F (36.5 C) 97.6 F (36.4 C)  SpO2: 91% 98%   Controlled on 6/8 10.  Atrial fibrillation.  Lanoxin 0.125 mg daily.  Cardiac rate controlled 11.  Chronic diastolic congestive heart failure.  Demadex 20 mg daily.  Monitor for any signs of fluid overload Filed Weights   01/09/20 0410 01/10/20 0512 01/11/20 0500  Weight: 92.8 kg 91.9 kg 92.9 kg   Stable on 6/7- with rising BUN will reduce demadex dose 12.  Diabetes mellitus with peripheral neuropathy.  Hemoglobin A1c 11.6.  NovoLog 2 units every 4 hours, Lantus insulin 60 units nightly, Glucophage  500 mg twice daily CBG (last 3)  Recent Labs    01/10/20 1656 01/10/20 2116 01/11/20 0606  GLUCAP 132* 187* 184*   Lantus increased to 65 nightly on 6/8  Increase to 70U, am CBG goal <180             Monitor with increased mobility 13.  COPD with tobacco abuse.  Continue nebulizer treatments 14.  Polysubstance abuse.  Urine drug screen positive cocaine.    Counsel when appropriate 15.  Hyperlipidemia.  Crestor 16.  GERD.  Protonix 17.  AKI  Creatinine 1.14, labs ordered for tomorrow  Free H20 increased but BUN cont to rise will  reduce demadex   LOS: 6 days A FACE TO FACE EVALUATION WAS PERFORMED  Charlett Blake 01/11/2020, 8:21 AM

## 2020-01-11 NOTE — Progress Notes (Signed)
Occupational Therapy Session Note  Patient Details  Name: Robin [name] MRN: [id number] Date of Birth: 1965/10/26  Today's Date: 01/11/2020 OT Individual Time: 0835-0900 OT Individual Time Calculation (min): 25 min    Short Term Goals: Week 1:  OT Short Term Goal 1 (Week 1): Pt will maintain static sitting balance EOB with close supervision in preparation for selfcare tasks. OT Short Term Goal 2 (Week 1): Pt will complete UB bathing with no more than mod instructional cueing and min assist. OT Short Term Goal 3 (Week 1): Pt will donn a pullover shirt with max assist for 2 consecutive sessions. OT Short Term Goal 4 (Week 1): Pt will follow one step instructional commands related to selfcare tasks with 50% accuracy during OT session. OT Short Term Goal 5 (Week 1): Pt will complete LB bathing sit to stand with max assist for two consecutive sessions.  Skilled Therapeutic Interventions/Progress Updates:    1:1 Pt in bed when arrived and getting a bandage change to midline. Pt with her eyes closed. Pt initially not wanting to participate. With contextual/ environmental cues pt willing to engage in bed mobility (rolling) for OT to assist with donning pants. Pt rolled to her right with min A with tactile cues and mod to the left. Noted pt with movement in right Ue lifting it up but then not able to reproduce. Pt came to EOb (off the left side of the bed with mod A ). PT able to sit EOB with contact guard. Again with contextual cues pt participated in donning shirt with mod A. Pt then wanted to return to supine despite attempts to encourage transferring to chair. PT left resting in the bed.   Therapy Documentation Precautions:  Precautions Precautions: Fall Precaution Comments: R hemiparesis, R inattention Restrictions Weight Bearing Restrictions: No General: General PT Missed Treatment Reason: Patient unwilling to participate Vital Signs:   Pain: Pain Assessment Pain Scale: Faces Faces  Pain Scale: Hurts little more Pain Type: Acute pain Pain Location: Abdomen Pain Orientation: Medial Pain Descriptors / Indicators: Grimacing;Discomfort Pain Onset: On-going Pain Intervention(s): Other (Comment)(attempted to help pt to restroom) Multiple Pain Sites: No   Therapy/Group: Individual Therapy  Willeen Cass [facility] 01/11/2020, 11:24 AM

## 2020-01-11 NOTE — Progress Notes (Signed)
Occupational Therapy Session Note  Patient Details  Name: Robin [name] MRN: [id number] Date of Birth: Apr 07, 1966  Today's Date: 01/11/2020 OT Individual Time: 1305-1400 OT Individual Time Calculation (min): 55 min    Short Term Goals: Week 1:  OT Short Term Goal 1 (Week 1): Pt will maintain static sitting balance EOB with close supervision in preparation for selfcare tasks. OT Short Term Goal 2 (Week 1): Pt will complete UB bathing with no more than mod instructional cueing and min assist. OT Short Term Goal 3 (Week 1): Pt will donn a pullover shirt with max assist for 2 consecutive sessions. OT Short Term Goal 4 (Week 1): Pt will follow one step instructional commands related to selfcare tasks with 50% accuracy during OT session. OT Short Term Goal 5 (Week 1): Pt will complete LB bathing sit to stand with max assist for two consecutive sessions.  Skilled Therapeutic Interventions/Progress Updates:    Treatment session with focus on functional mobility, sit > stand, Rt attention, RUE NMR.  Pt received supine in bed with daughter present.  Pt required increased cues and encouragement from both therapist and daughter to engage in therapy session.  Pt initially swatting therapist's hand away, but ultimately able to be encouraged to engage by going outside.  Pt completed bed mobility with min assist, crying out in pain in Lt side/abdomen therefore returning to supine.  Pt completed self-massage for ~2 mins and then agreeable to returning to EOB.  Completed bed mobility min assist and completed squat pivot transfer bed > w/c mod assist +2 for safety and due to pt not wanting therapist to assist.  Educated on high fall risk and need for physical assistance at this time.  Pt engaged in basic self-care at sink with washing face and Lt arm.  Mod multimodal cues to initiate and sequence washing face.  Pt attempting to wash LUE with Rt hand, therapist provided hand over hand assistance to wash Lt arm.   Therapist pushed pt outside for increased mood and participation as pt with decreased participation in therapy sessions recently. Pt pleased to be outside, engaged in visual scanning and attention to Lt with pt able to attend briefly to Lt field in <20% of attempts.  Returned to unit and engaged in sit > stand at high -low table x3 with mod assist and increased time for initiation and sequencing.  Pt requiring increased assistance once standing due to increased pushing to Lt in standing.  Utilized colored magnets on board to facilitate increased standing and sustained attention to task. Pt shaking head no after 2 stands, but ultimately agreeable to completing one additional stand.  Returned to room and completed squat pivot transfer max assist +2 to Rt due to decreased attention to Rt and increased fatigue.  Returned to supine total assist and left with pillow under RUE for increased positioning.  Daughter asking questions about swallow precautions and diet, deferred to SLP and encouraged daughter to attempt speech therapy session when she is able.  Therapy Documentation Precautions:  Precautions Precautions: Fall Precaution Comments: R hemiparesis, R inattention Restrictions Weight Bearing Restrictions: No General:   Vital Signs: Therapy Vitals Temp: 97.6 F (36.4 C) Pulse Rate: 82 Resp: 14 BP: 99/63 Patient Position (if appropriate): Lying Oxygen Therapy SpO2: 98 % O2 Device: Room Air Pain:  Pt with c/o pain along Lt side/abdomen during mobility.  Repositioned.  Pain not present during remaining mobility   Therapy/Group: Individual Therapy  Simonne Come 01/11/2020, 2:58 PM

## 2020-01-11 NOTE — Progress Notes (Signed)
Speech Language Pathology Daily Session Note  Patient Details  Name: Robin [name] MRN: [id number] Date of Birth: [birth year]  Today's Date: 01/11/2020 SLP Individual Time: 0932-3557 SLP Individual Time Calculation (min): 31 min  Short Term Goals: Week 1: SLP Short Term Goal 1 (Week 1): Pt will consume current diet with minimal overt s/sx aspiration and efficient mastication and oral clearance with Mod A for use of safe swallow strategies. SLP Short Term Goal 2 (Week 1): Pt will consume trials of dys 2 solids with efficient masication and oral clearance X2 prior to advancement. SLP Short Term Goal 3 (Week 1): Pt will consume trials of thin liquids with minimal overt s/sx aspiration X3 prior to repeat MBSS. SLP Short Term Goal 4 (Week 1): Pt will name common objects with 25% accuracy provided Max A multimodal cues. SLP Short Term Goal 5 (Week 1): Pt will answer yes/no questions with 75% accuracy with Max A multimodal cues. SLP Short Term Goal 6 (Week 1): Pt will express basic wants and needs via gestures or other multimodal means with Max A cues.  Skilled Therapeutic Interventions: Pt was seen for skilled ST targeting cognitive-linguistic goals. Upon arrival, pt was very lethargic and resistant to interventions to improve arousal. Max A encouragement and repositioning provided, and eventually pt allowed SLP to perform oral care via suction toothbrush with Total A. With Max A question cues and use of gestures pt communicated abdominal pain. Pt refused to get out of bed to toilet, and communicated she had already soiled her brief. NT provided assistance with hygiene while SLP provided Max A verbal cues for sustained attention and step-by-step directions for problem solving bed mobility. Once clean and dry, SLP attempted to engage pt in language based activities to work toward functional communication, however pt refused to engage despite multiple attempts. Pt also refused to accept any PO to work toward  diet advancement. Pt missed 29 mins skilled ST as a result. Pt left laying in bed with alarm set and needs within reach. Continue per current plan of care.        Pain Pain Assessment Pain Scale: Faces Faces Pain Scale: Hurts little more Pain Type: Acute pain Pain Location: Abdomen Pain Orientation: Medial Pain Descriptors / Indicators: Grimacing;Discomfort Pain Onset: On-going Pain Intervention(s): Other (Comment)(attempted to help pt to restroom) Multiple Pain Sites: No  Therapy/Group: Individual Therapy  Arbutus Leas 01/11/2020, 7:20 AM

## 2020-01-12 ENCOUNTER — Inpatient Hospital Stay (HOSPITAL_COMMUNITY): Payer: 59 | Admitting: Physical Therapy

## 2020-01-12 ENCOUNTER — Inpatient Hospital Stay (HOSPITAL_COMMUNITY): Payer: 59 | Admitting: Speech Pathology

## 2020-01-12 ENCOUNTER — Inpatient Hospital Stay (HOSPITAL_COMMUNITY): Payer: 59 | Admitting: Occupational Therapy

## 2020-01-12 LAB — GLUCOSE, CAPILLARY
Glucose-Capillary: 150 mg/dL — ABNORMAL HIGH (ref 70–99)
Glucose-Capillary: 189 mg/dL — ABNORMAL HIGH (ref 70–99)
Glucose-Capillary: 101 mg/dL — ABNORMAL HIGH (ref 70–99)
Glucose-Capillary: 145 mg/dL — ABNORMAL HIGH (ref 70–99)

## 2020-01-12 MED ORDER — TAMSULOSIN HCL 0.4 MG PO CAPS
0.4000 mg | ORAL_CAPSULE | Freq: Every day | ORAL | Status: DC
  Administered 2020-01-12 – 2020-01-18 (×7): 0.4 mg via ORAL
  Filled 2020-01-12 (×7): qty 1

## 2020-01-12 MED ORDER — BETHANECHOL CHLORIDE 10 MG PO TABS
10.0000 mg | ORAL_TABLET | Freq: Three times a day (TID) | ORAL | Status: DC
  Administered 2020-01-12 – 2020-01-13 (×5): 10 mg via ORAL
  Filled 2020-01-12 (×6): qty 1

## 2020-01-12 NOTE — Progress Notes (Signed)
Speech Language Pathology Daily Session Note  Patient Details  Name: Robin [name] MRN: [id number] Date of Birth: 04/10/1966  Today's Date: 01/12/2020 SLP Individual Time: 0109-3235 SLP Individual Time Calculation (min): 57 min  Short Term Goals: Week 1: SLP Short Term Goal 1 (Week 1): Pt will consume current diet with minimal overt s/sx aspiration and efficient mastication and oral clearance with Mod A for use of safe swallow strategies. SLP Short Term Goal 2 (Week 1): Pt will consume trials of dys 2 solids with efficient masication and oral clearance X2 prior to advancement. SLP Short Term Goal 3 (Week 1): Pt will consume trials of thin liquids with minimal overt s/sx aspiration X3 prior to repeat MBSS. SLP Short Term Goal 4 (Week 1): Pt will name common objects with 25% accuracy provided Max A multimodal cues. SLP Short Term Goal 5 (Week 1): Pt will answer yes/no questions with 75% accuracy with Max A multimodal cues. SLP Short Term Goal 6 (Week 1): Pt will express basic wants and needs via gestures or other multimodal means with Max A cues.  Skilled Therapeutic Interventions: Pt was seen for skilled ST targeting dysphagia and communication. [name] daughter present for session - education provided regarding current diet recommendations and approved items she could bring in (greek yogurt). Discussed repeating swallow test in near future. Pt able to read the the word level with ~25% accuracy during naming tasks, and answered yes/no questions regarding orientation to place and self accurately. With written supports and Max A models, pt counted from 1-5 with 100% accuracy, and approximations for 6 and 7, however numbers beyond 7 consisted of neologisms. Pt with very limited ability to repeat at phoneme and word level. Writing tasks re-attempted today, during which pt could copy words. She also required Moderate verbal and visual cues for redirection to tasks throughout session due to  distractibility. Pt more agreeable to PO intake/trials of advanced liquids and solids today in comparison to yesterday. After completing oral care via suction, pt accepted trials of ice chips and thin H2O via tsp without overt s/sx aspiration noted. Vocal quality remained clear. Pt then accepted very small quantity of Dys 2 (minced/[name]) solids. Pt with intermittent trace lingual residue which could be cleared with Min A verbal cues from clinician. Pt declined further trials. Would recommend continue current diet, and attempt larger quantity trial at next session. Prognosis for solid advancement is excellent, and pending consistency in participation ST would anticipate repeat MBSS could be scheduled within the next 2-4 days. Pt left laying in bed with alarm set and needs within reach, daughter still present. Continue per current plan of care.          Pain Pain Assessment Pain Scale: Faces Faces Pain Scale: No hurt  Therapy/Group: Individual Therapy  Arbutus Leas 01/12/2020, 6:55 AM

## 2020-01-12 NOTE — Progress Notes (Signed)
Patient ID: Robin [name], female   DOB: [birth year], 54 y.o.   MRN: [id number]  Team Conference Report to Patient/Family  Team Conference discussion was reviewed with the patient and caregiver, including goals, any changes in plan of care and target discharge date.  Patient and caregiver express understanding and are in agreement.  The patient has a target discharge date of 01/28/20. Patient sister would like staff to communicate with patient more efficiently. Also, would like more encouragement from staff. Sister plans to sit in on some therapy sessions to assist staff with participating and assist with meals.  Dyanne Iha 01/12/2020, 1:43 PM

## 2020-01-12 NOTE — Progress Notes (Signed)
Occupational Therapy Weekly Progress Note  Patient Details  Name: Robin [name] MRN: [id number] Date of Birth: [birth year]  Beginning of progress report period: January 06, 2020 End of progress report period: January 12, 2020  Today's Date: 01/12/2020 OT Individual Time: 8115-7262 OT Individual Time Calculation (min): 61 min    Patient has met 4 of 5 short term goals.  Robin [name] is making steady progress with OT at this time.  She continues to be self limiting in sessions, with inconsistent participation in sessions, but when she has participated she has been able to complete UB selfcare at an overall mod assist level.  LB selfcare sit to stand is at a max assist level secondary to RLE weakness as well as functional transfers stand pivot to the toilet, bed, or wheelchair.  She currently completes squat pivot transfers with mod assist overall to varying surfaces.  Robin [name] continues to demonstrate right inattention with left head turn most of the time.  She will scan across midline to the right with max instructional cueing.  Currently, she demonstrates only Brunnstrum stage II movement in the right arm and hand, with max assist needed to integrate the UE into functional tasks as a gross assist.  Endurance continues to be limited as well, with pt demonstrating significant fatigue after completion of OT sessions.  She will tolerate sitting up after sessions, but immediately falls asleep with head in her left hand in the tilt in space wheelchair.  She also continues with global aphasia and currently follows approximately 50-60% of one step instructional commands.  Feel is her participation is consistent she can continue to benefit from intense CIR level therapy for progression to overall min assist level goals.  Recommend continue OT at CIR level in anticipation of discharge home with her daughter and 24 hr assist.    Patient continues to demonstrate the following deficits: muscle weakness, impaired timing and  sequencing, unbalanced muscle activation and decreased coordination, decreased attention to right, decreased initiation, decreased attention, decreased awareness, decreased problem solving, decreased safety awareness, decreased memory and delayed processing and decreased sitting balance, decreased standing balance, decreased postural control, hemiplegia and decreased balance strategies and therefore will continue to benefit from skilled OT intervention to enhance overall performance with BADL and Reduce care partner burden.  Patient progressing toward long term goals..  Continue plan of care.  OT Short Term Goals Week 2:  OT Short Term Goal 1 (Week 2): Pt will complete UB bathing with no more than mod instructional cueing and min assist. OT Short Term Goal 2 (Week 2): Pt will donn a pullover shirt with no more than min assist and min demonstrational cueing following hemi dressing techniques. OT Short Term Goal 3 (Week 2): Pt will complete LB bathing with mod assist sit to stand for 2 consecutive sessions. OT Short Term Goal 4 (Week 2): Pt will use the RUE as a gross assist with selfcare tasks and mod facilitation.  Skilled Therapeutic Interventions/Progress Updates:    Robin [name] was in the bed to start session with evidence of bowel and bladder incontinence.  She was able to roll to the right side with min assist in order for therapist to assist with cleaning her.  She then transitioned to sitting with mod assist.  Static sitting balance EOB was at supervision level with transfer squat pivot to the wheelchair with mod assist squat pivot.  She was able to then be transferred over to the sink for bathing and dressing tasks.  She needed max instructional cueing with min demonstrational cueing to sequence UB bathing.  She needed max hand over hand assistance for integration of the RUE for washing the left arm.  She then needed max assist for LB bathing sit to stand, with three attempts to stand to complete  washing the front and back peri area.  She was able to donn her brief and paper scrub pants with max assist, including standing to pull them up over her hips.  Mod assist with mod demonstrational cueing for donning pullover gown following hemi techniques to begin with the RUE first.  Therapist provided total assist for donning TEDs and gripper socks.  Pt with grimacing and moaning when therapist applied pressure around the right foot and ankle.  Pt unable to state if it was the ankle or the foot specifcally   Pt with increased fatigue at end of session with her staying in the wheelchair and propping her head on her left hand to rest, with eyes closed.  She needed mod demonstrational cueing for demonstrating use of the call button appropriately.  Safety belt in place with chair tilted back 15-20 degrees.  Pt overall agreeable to therapy with mod persuasion from therapist.      Therapy Documentation Precautions:  Precautions Precautions: Fall Precaution Comments: R hemiparesis, R inattention Restrictions Weight Bearing Restrictions: No   Pain: Pain Assessment Pain Scale: Faces Faces Pain Scale: No hurt Pain Type: Acute pain Pain Location: Ankle Pain Orientation: Right Pain Descriptors / Indicators: Discomfort;Grimacing Pain Onset: With Activity Pain Intervention(s): Repositioned Multiple Pain Sites: No ADL: See Care Tool Section for some details of mobility and selfcare  Therapy/Group: Individual Therapy  Ercilia Bettinger OTR/L 01/12/2020, 12:48 PM

## 2020-01-12 NOTE — Progress Notes (Addendum)
[place] PHYSICAL MEDICINE & REHABILITATION PROGRESS NOTE   Subjective/Complaints:  Able to say "OK" and "father"  ROS- cannot obtain due to aphasia/somnolence  Objective:   No results found. No results for input(s): WBC, HGB, HCT, PLT in the last 72 hours. Recent Labs    01/10/20 1419  NA 137  K 4.5  CL 101  CO2 25  GLUCOSE 146*  BUN 40*  CREATININE 1.15*  CALCIUM 9.6    Intake/Output Summary (Last 24 hours) at 01/12/2020 0754 Last data filed at 01/12/2020 0552 Gross per 24 hour  Intake 640 ml  Output 1650 ml  Net -1010 ml     Physical Exam: Vital Signs Blood pressure 114/82, pulse 83, temperature 98 F (36.7 C), resp. rate 14, weight 92.7 kg, last menstrual period 08/21/2011, SpO2 97 %.  General: No acute distress Mood and affect are appropriate Heart: Regular rate and rhythm no rubs murmurs or extra sounds Lungs: Clear to auscultation, breathing unlabored, no rales or wheezes Abdomen: Positive bowel sounds, soft nontender to palpation, nondistended Extremities: No clubbing, cyanosis, or edema Skin: No evidence of breakdown, no evidence of rash    Neurologic: Cranial nerves II through XII intact, motor strength is 5/5 in left . 2-/5 RIght  deltoid, bicep, tricep, grip, 5/5 left  0/5 right hip flexor, knee extensors, ankle dorsiflexor and plantar flexor,2- right hip ext Sensory exam unable to assess due to aphasia    Musculoskeletal: Full range of motion in all 4 extremities. No joint swelling   Assessment/Plan: 1. Functional deficits secondary to Cardioembolic Left MCA infarct  which require 3+ hours per day of interdisciplinary therapy in a comprehensive inpatient rehab setting.  Physiatrist is providing close team supervision and 24 hour management of active medical problems listed below.  Physiatrist and rehab team continue to assess barriers to discharge/monitor patient progress toward functional and medical goals  Care Tool:  Bathing    Body parts  bathed by patient: Face, Chest, Abdomen, Left upper leg, Right upper leg, Left lower leg   Body parts bathed by helper: Left arm, Front perineal area, Buttocks, Right lower leg, Right arm     Bathing assist Assist Level: Maximal Assistance - Patient 24 - 49%     Upper Body Dressing/Undressing Upper body dressing   What is the patient wearing?: Hospital gown only    Upper body assist Assist Level: Total Assistance - Patient < 25%    Lower Body Dressing/Undressing Lower body dressing      What is the patient wearing?: Incontinence brief     Lower body assist Assist for lower body dressing: Total Assistance - Patient < 25%     Toileting Toileting    Toileting assist Assist for toileting: 2 Helpers     Transfers Chair/bed transfer  Transfers assist  Chair/bed transfer activity did not occur: Safety/medical concerns  Chair/bed transfer assist level: Moderate Assistance - Patient 50 - 74%(squat pivot)     Locomotion Ambulation   Ambulation assist   Ambulation activity did not occur: Safety/medical concerns  Assist level: 2 helpers Assistive device: (rail) Max distance: 5 ft   Walk 10 feet activity   Assist  Walk 10 feet activity did not occur: Safety/medical concerns        Walk 50 feet activity   Assist Walk 50 feet with 2 turns activity did not occur: Safety/medical concerns         Walk 150 feet activity   Assist Walk 150 feet activity did not occur:  Safety/medical concerns         Walk 10 feet on uneven surface  activity   Assist Walk 10 feet on uneven surfaces activity did not occur: Safety/medical concerns         Wheelchair     Assist Will patient use wheelchair at discharge?: (TBD)             Wheelchair 50 feet with 2 turns activity    Assist            Wheelchair 150 feet activity     Assist          Blood pressure 114/82, pulse 83, temperature 98 F (36.7 C), resp. rate 14, weight 92.7 kg,  last menstrual period 08/21/2011, SpO2 97 %.  Medical Problem List and Plan: 1.  Right-sided hemiparesis and aphasia secondary to left MCA infarction/petechial hemorrhage with saccular aneurysm at the paraclinoid right ICA status post mechanical thrombectomy  Continue CIR  Team conference today please see physician documentation under team conference tab, met with team  to discuss problems,progress, and goals. Formulized individual treatment plan based on medical history, underlying problem and comorbidities.  WHO/PRAFO qhs  2.  Antithrombotics: -DVT/anticoagulation: Eliquis             -antiplatelet therapy: N/A 3. Pain Management: Neurontin 100 mg every 8 hours, Zanaflex 2 mg twice daily as needed  Appears controlled on 6/8             Monitor with increased exertion 4. Mood: Provide emotional support- cannot assess accurately related to severe aphasia, empirically started on citalopram for "flat affect"  Which may be MCA infarct effect              -antipsychotic agents: N/A 5. Neuropsych: This patient is not capable of making decisions on her own behalf. 6. Skin/Wound Care: Routine skin checks 7. Fluids/Electrolytes/Nutrition: Routine in and outs.   8.  Post stroke dysphagia.    Dysphagia #1 pudding thick liquids.  Follow-up speech therapy.  Advance diet as tolerated 9.  Hypertension.  Coreg 50 mg twice daily,Bidil 20-37.5 mg 2 tabs 3 times daily            Vitals:   01/11/20 1948 01/12/20 0505  BP: 106/71 114/82  Pulse: 84 83  Resp: 18 14  Temp: 97.6 F (36.4 C) 98 F (36.7 C)  SpO2: 99% 97%   Controlled on 6/9 10.  Atrial fibrillation.  Lanoxin 0.125 mg daily.  Cardiac rate controlled 11.  Chronic diastolic congestive heart failure.  Demadex 20 mg daily.  Monitor for any signs of fluid overload Filed Weights   01/10/20 0512 01/11/20 0500 01/12/20 0505  Weight: 91.9 kg 92.9 kg 92.7 kg   Stable on 6/7- with rising BUN will reduce demadex dose 12.  Diabetes mellitus with  peripheral neuropathy.  Hemoglobin A1c 11.6.  NovoLog 2 units every 4 hours, Lantus insulin 60 units nightly, Glucophage 500 mg twice daily CBG (last 3)  Recent Labs    01/11/20 1644 01/11/20 2127 01/12/20 0615  GLUCAP 152* 101* 150*    Increase to 70U, am CBG goal <180-met             Monitor with increased mobility 13.  COPD with tobacco abuse.  Continue nebulizer treatments 14.  Polysubstance abuse.  Urine drug screen positive cocaine.    Counsel when appropriate 15.  Hyperlipidemia.  Crestor 16.  GERD.  Protonix 17.  AKI Creat ok mainly prerenal azotemia with elevated BUN  LOS: 7 days A FACE TO FACE EVALUATION WAS PERFORMED  Charlett Blake 01/12/2020, 7:54 AM

## 2020-01-12 NOTE — Progress Notes (Signed)
Physical Therapy Session Note  Patient Details  Name: Robin [name] MRN: [id number] Date of Birth: [birth year]  Today's Date: 01/12/2020 PT Individual Time: 1415-1455 PT Individual Time Calculation (min): 40 min   Short Term Goals: Week 1:  PT Short Term Goal 1 (Week 1): Pt will perform supine<>sit with mod assist PT Short Term Goal 2 (Week 1): Pt will perform sit<>stand using LRAD with max assist of 1 (not using lift) PT Short Term Goal 3 (Week 1): Pt will perform bed<>chair transfers with max assist of 2 (not using lift)  Skilled Therapeutic Interventions/Progress Updates:   Pt received supine in bed, asleep. Pt aroused with ease and agreeable to PT. Pt assisting pt to bring RLE to EOB and pt grimaced in pan. PT Palpated BLE with noted mild facial grimace in while light to medium pressure the lateral and medial aspect R ankle and severe facial grimace to even light touch in the anterior aspect of ankle. No other signs of pain to palpation anywhere else in RLE or foot. Sit>supine with mod assist at trunk and for RLE control. Sitting balance EOB  x 76mnutes while engaged in RUE/RLE NMR. Supervision assist overall to maintain balance, no posterior or R LOB noted throughout. R UE flexion/extension in symmetry 2 x 15, RLE knee extension AAROM x 10, RLE hip.knee extension x 10 with extensors activated in symmetry intermittently.   Pt performed sit<>stand with mod assist in stedy after 2 attempts. Pt able to tolerate standing x 30 seconds before demonstrating pain response. Stand>sit with min assist from stedy [facility] Once back in bed, pt responds "no" when questioned about dizziness, but states "yes" when asked in Leg/ankle was hurting. Pt requesting lie down. Sit>supine with mod assist for trunk and RLE management. Pt left supine in bed with call bell in reach and all needs met. PA notified of RLE ankle pain.      Therapy Documentation Precautions:  Precautions Precautions:  Fall Precaution Comments: R hemiparesis, R inattention Restrictions Weight Bearing Restrictions: No General: PT Amount of Missed Time (min): 30 Minutes PT Missed Treatment Reason: Patient unwilling to participate;Patient fatigue Vital Signs: Therapy Vitals Temp: 97.6 F (36.4 C) Pulse Rate: 78 Resp: 18 BP: 111/77 Patient Position (if appropriate): Lying Oxygen Therapy SpO2: 100 % O2 Device: Room Air Pain: Pain Assessment Pain Scale: Faces Faces Pain Scale: Hurts little more Pain Location: Ankle Pain Orientation: Right Pain Descriptors / Indicators: Grimacing;Discomfort Pain Onset: With Activity   Therapy/Group: Individual Therapy  ALorie Phenix6/04/2020, 4:03 PM
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
## 2020-01-13 ENCOUNTER — Inpatient Hospital Stay (HOSPITAL_COMMUNITY): Payer: 59 | Admitting: Physical Therapy

## 2020-01-13 ENCOUNTER — Inpatient Hospital Stay (HOSPITAL_COMMUNITY): Payer: 59 | Admitting: Speech Pathology

## 2020-01-13 ENCOUNTER — Inpatient Hospital Stay (HOSPITAL_COMMUNITY): Payer: 59 | Admitting: Occupational Therapy

## 2020-01-13 LAB — GLUCOSE, CAPILLARY
Glucose-Capillary: 161 mg/dL — ABNORMAL HIGH (ref 70–99)
Glucose-Capillary: 165 mg/dL — ABNORMAL HIGH (ref 70–99)
Glucose-Capillary: 129 mg/dL — ABNORMAL HIGH (ref 70–99)
Glucose-Capillary: 143 mg/dL — ABNORMAL HIGH (ref 70–99)

## 2020-01-13 NOTE — Progress Notes (Signed)
Nutrition Follow-up  DOCUMENTATION CODES:   Obesity unspecified  INTERVENTION:   Tube feeds via Cortrak: - Glucerna 1.2 @ 70 ml/hr to run for 12 hours from 1800 to 0600 (total of 840 ml) - Pro-stat 30 ml BID - Free water per MD, currently 250 ml q 6 hours  Tube feeding regimen and free water provides 1208 kcal, 80 grams of protein, and 1676 ml of H2O (67% of kcal needs, 80% of protein needs).  - Continue MVI with minerals daily  NUTRITION DIAGNOSIS:   Inadequate oral intake related to dysphagia as evidenced by meal completion < 25%.  Ongoing, being addressed via TF  GOAL:   Patient will meet greater than or equal to 90% of their needs  Progressing  MONITOR:   PO intake, Diet advancement, Labs, Weight trends, TF tolerance  REASON FOR ASSESSMENT:   Consult Enteral/tube feeding initiation and management  ASSESSMENT:   54 year old female with PMH of CHF, HTN, atrial fibrillation, COPD, polysubstance abuse, DM, and tobacco abuse. Presented on 12/27/19 with right hemiparesis, AMS, and aphasia. MRI showed evolving left MCA infarction with hemorrhagic transformation. Pt underwent mechanical thrombectomy per interventional radiology and required intubation and was extubated 12/28/19. Started on dysphagia 1 diet with pudding-thick liquids with Cortrak tube in place for nutritional support. Admitted to CIR on 01/05/20.  Noted target d/c date of 01/28/20.  Discussed pt with RN and NT. NT reports pt still is not eating much at meal times but does enjoy grits, mashed potatoes, and fruit. RD ordered these items to come with each meal. NT reports pt not eating much because she does not like dysphagia 1 texture. Noted diet upgraded today to dysphagia 2, still with pudding-thick liquids.  Spoke with pt at bedside. Pt nodded when RD asked if tube feeds made her feel full. RD to decrease nocturnal TF rate in hopes of stimulating appetite during the day.  Weight stable with fluctuations  between 202-207 lbs over the last week. Will continue to monitor.  Current TF: Glucerna 1.2 @ 80 ml/hr for 12 hours from 1800 to 0600, Pro-stat 30 ml BID, free water 250 ml q 6 hours  Meal Completion: 0-100% x last 8 recorded meals (averaging 22%)  Medications reviewed and include: SSI, Lantus 68 units daily at bedtime, MVI with minerals, klor-con 40 mEq daily, torsemide, protonix  Labs reviewed. CBG's: 101-165 x 24 hours  UOP: 1000 ml x 24 hours  NUTRITION - FOCUSED PHYSICAL EXAM:    Most Recent Value  Orbital Region No depletion  Upper Arm Region No depletion  Thoracic and Lumbar Region No depletion  Buccal Region No depletion  Temple Region No depletion  Clavicle Bone Region No depletion  Clavicle and Acromion Bone Region No depletion  Scapular Bone Region No depletion  Dorsal Hand No depletion  Patellar Region No depletion  Anterior Thigh Region Mild depletion  Posterior Calf Region No depletion  Edema (RD Assessment) None  Hair Reviewed  Eyes Reviewed  Mouth Reviewed  Skin Reviewed  Nails Reviewed       Diet Order:   Diet Order            DIET DYS 2 Room service appropriate? Yes; Fluid consistency: Pudding Thick  Diet effective now                 EDUCATION NEEDS:   No education needs have been identified at this time  Skin:  Skin Assessment: Reviewed RN Assessment (MASD perineum, blister to right thigh)  Last  BM:  01/13/20 large type 4  Height:   Ht Readings from Last 1 Encounters:  12/31/19 5\' 6"  (1.676 m)    Weight:   Wt Readings from Last 1 Encounters:  01/13/20 94.2 kg    Ideal Body Weight:  59.1 kg  BMI:  Body mass index is 33.52 kg/m.  Estimated Nutritional Needs:   Kcal:  1800-2000  Protein:  100-115 grams  Fluid:  1.8-2.0 L    Gaynell Face, MS, RD, LDN Inpatient Clinical Dietitian Pager: [phone] Weekend/After Hours: [phone]

## 2020-01-13 NOTE — Progress Notes (Signed)
Occupational Therapy Session Note  Patient Details  Name: Robin [name] MRN: [id number] Date of Birth: [birth year]  Today's Date: 01/13/2020 OT Individual Time: 1105-1205 OT Individual Time Calculation (min): 60 min    Short Term Goals: Week 2:  OT Short Term Goal 1 (Week 2): Pt will complete UB bathing with no more than mod instructional cueing and min assist. OT Short Term Goal 2 (Week 2): Pt will donn a pullover shirt with no more than min assist and min demonstrational cueing following hemi dressing techniques. OT Short Term Goal 3 (Week 2): Pt will complete LB bathing with mod assist sit to stand for 2 consecutive sessions. OT Short Term Goal 4 (Week 2): Pt will use the RUE as a gross assist with selfcare tasks and mod facilitation.  Skilled Therapeutic Interventions/Progress Updates:    Pt sitting in the tilt in space wheelchair to begin session.  Therapist transported her down to the dayroom and had her transfer to the therapy mat to the right with mod assist squat pivot.  Once on the mat, she was able to maintain static sitting balance with close supervision.  Had her work on sit to stand from the [facility] as well as standing balance for greater activation of the RLE.  She was able to complete sit to stand with mod assist but needed max assist for sustained activation of right knee extension.  She was only able to tolerate standing for approximately one minute before transitioning to sitting.  In sitting, she would exhibit head turn to the left with eyes closed.  She would at times need max encouragement to try and stand again, resisting therapist's assist initially.  Unsure if she was having pain, as she could not verbalize this when asked and would not point to any areas of her body that were hurting when asked.  Therapist provided 2-3" round wooden pegs for pt to try and place in the peg board with the RUE while sitting.  She could demonstrate slight digit flexion and extension for grasping  pegs, but needed overall max hand over hand to complete three of them.  Therapist placed the RUE in weightbearing on the mat as well and had her try to visually scan and reach across her body to the right side with the left hand to retrieve a peg of a certain color that therapist requested.  She could pick up the correct color 1/3 attempts.  Noted pt with increased fatigue while working, with pt closing her eyes more as well.  Finished session with functional mobility back to the chair with total assist +2 (pt 40%).  She needed min assist at times to advance the RLE with stepping, with max facilitation to keep the knee from buckling in stance phase.  Pt returned to the room where she was positioned with the wheelchair in slight recline and call button and phone in reach.  Safety belt in place as well.       Therapy Documentation Precautions:  Precautions Precautions: Fall Precaution Comments: R hemiparesis, R inattention Restrictions Weight Bearing Restrictions: No  Pain: Pain Assessment Pain Scale: Faces Faces Pain Scale: No hurt ADL: See Care Tool Section for some details of mobility and selfcare  Therapy/Group: Individual Therapy  Eytan Carrigan OTR/L 01/13/2020, 3:42 PM

## 2020-01-13 NOTE — Progress Notes (Signed)
Speech Language Pathology Weekly Progress and Session Note  Patient Details  Name: Robin [name] MRN: [id number] Date of Birth: Jun 27, 1966  Beginning of progress report period: January 06, 2020 End of progress report period: January 13, 2020  Today's Date: 01/13/2020 SLP Individual Time: 2500-3704 SLP Individual Time Calculation (min): 28 min  Short Term Goals: Week 1: SLP Short Term Goal 1 (Week 1): Pt will consume current diet with minimal overt s/sx aspiration and efficient mastication and oral clearance with Mod A for use of safe swallow strategies. SLP Short Term Goal 1 - Progress (Week 1): Met SLP Short Term Goal 2 (Week 1): Pt will consume trials of dys 2 solids with efficient masication and oral clearance X2 prior to advancement. SLP Short Term Goal 2 - Progress (Week 1): Met SLP Short Term Goal 3 (Week 1): Pt will consume trials of thin liquids with minimal overt s/sx aspiration X3 prior to repeat MBSS. SLP Short Term Goal 3 - Progress (Week 1): Met SLP Short Term Goal 4 (Week 1): Pt will name common objects with 25% accuracy provided Max A multimodal cues. SLP Short Term Goal 4 - Progress (Week 1): Progressing toward goal SLP Short Term Goal 5 (Week 1): Pt will answer yes/no questions with 75% accuracy with Max A multimodal cues. SLP Short Term Goal 5 - Progress (Week 1): Met SLP Short Term Goal 6 (Week 1): Pt will express basic wants and needs via gestures or other multimodal means with Max A cues. SLP Short Term Goal 6 - Progress (Week 1): Met    New Short Term Goals: Week 2: SLP Short Term Goal 1 (Week 2): Pt will consume current diet with minimal overt s/sx aspiration and efficient mastication and oral clearance with Min A for use of safe swallow strategies. SLP Short Term Goal 2 (Week 2): Pt will consume trials of advanced solids, demonstrating efficient mastication and oral clearance with no more than Min A cues X3 prior to advancement. SLP Short Term Goal 3 (Week 2): Pt will  name common objects with 25% accuracy provided Max A multimodal cues. SLP Short Term Goal 4 (Week 2): Pt will imitate at the phoneme level with 50% accuracy provided Max A multimodal cues. SLP Short Term Goal 5 (Week 2): Pt will express basic wants and needs via gestures or other multimodal means with Mod A cues. SLP Short Term Goal 6 (Week 2): Pt will sustain attention for functional tasks for 10 minute intervals with Mod A cues for redirection.  Weekly Progress Updates: Although pt's participation in therapy has fluctuated, she made functional gains and met 5 out of 6 short term goals this reporting period. Pt is currently Max assist for functional communication and basic cognitive tasks due to severe fluent expressive aphasia with mild receptive deficits of mildly complex language, and cognitive impairments impacting her sustained attention, problem solving, and emergent awareness. Pt is consuming a Dys 2 (minced/ground) diet with pudding thick liquids. Repeat MBSS is scheduled for tomorrow, 01/14/20 to reassess pharyngeal swallow function and potential for liquid advancement. [name] solid textures were upgraded from Dys 1 to Dys 2 this week due to improvements in ability to orally manipulate/masticate/clear advanced textures at bedside. Pt and family education is ongoing. Pt would continue to benefit from skilled ST while inpatient in order to maximize functional independence and reduce burden of care prior to discharge. Anticipate that pt will need 24/7 supervision at discharge in addition to [place] follow up at next level of care.  Intensity: Minumum of 1-2 x/day, 30 to 90 minutes Frequency: 3 to 5 out of 7 days Duration/Length of Stay: 01/28/20 Treatment/Interventions: Cognitive remediation/compensation;Cueing hierarchy;Dysphagia/aspiration precaution training;Functional tasks;Environmental controls;Internal/external aids;Multimodal communication approach;Speech/Language facilitation;Therapeutic  Activities;Patient/family education   Daily Session  Skilled Therapeutic Interventions: Pt was seen for skilled ST targeting dysphagia and communication goals. Pt performed oral care via suction toothbrush with set up assistance and Min A verbal cues for thoroughness. She then accepted trials of ice chips and self fed cup sips of thin H2O with 1 subtle throat clear noted. Some swallows were audible with cup sips, but vocal quality remained clear. Pt also consumed upgraded trial of Dys 2 solids, during which she exhibited functional more timely mastication and oral clearance of trace lingual residue with only Min A verbal cues for extra swallows. Recommend pt upgrade to Dys 2 solids, continue pudding thick liquids. Will schedule repeat MBSS for tomorrow at 0900 to instrumentally assess potential for liquid advancement.  [name] speech consistent largely of neolgisms throughout session, however she did produce "thank you" at end of session. She also functionally communicated desire to get back into bed via gestures with Max A cues for clarification from SLP. She then used more direct/clear gesturing strategy to indicate she wanted her TV turned on with decreased Mod A cues from clinician. She followed basic directions and provided appropriate yes/no answered to questions via head shake/nod throughout session.  Pt left sitting in tilt in space chair with seatbelt alarm on and call bell in hand, OT entering the room to receive pt for next session. Continue per current plan of care.        Pain Pain Assessment Pain Scale: Faces Faces Pain Scale: No hurt  Therapy/Group: Individual Therapy  Arbutus Leas 01/13/2020, 6:59 AM

## 2020-01-13 NOTE — Progress Notes (Addendum)
[place] PHYSICAL MEDICINE & REHABILITATION PROGRESS NOTE   Subjective/Complaints:  Per PT, OT, Right LE pain with donning TED hose.  No fall or reported injury  Pt aphasic with recept /express deficits   ROS- cannot obtain due to aphasia/somnolence  Objective:   No results found. No results for input(s): WBC, HGB, HCT, PLT in the last 72 hours. Recent Labs    01/10/20 1419  NA 137  K 4.5  CL 101  CO2 25  GLUCOSE 146*  BUN 40*  CREATININE 1.15*  CALCIUM 9.6    Intake/Output Summary (Last 24 hours) at 01/13/2020 0726 Last data filed at 01/13/2020 0100 Gross per 24 hour  Intake 10 ml  Output 1000 ml  Net -990 ml     Physical Exam: Vital Signs Blood pressure 104/77, pulse 84, temperature 97.7 F (36.5 C), temperature source Oral, resp. rate 20, weight 94.2 kg, last menstrual period 08/21/2011, SpO2 99 %.   General: No acute distress Mood and affect are appropriate Heart: Regular rate and rhythm no rubs murmurs or extra sounds Lungs: Clear to auscultation, breathing unlabored, no rales or wheezes Abdomen: Positive bowel sounds, soft nontender to palpation, nondistended Extremities: No clubbing, cyanosis, or edema Skin: No evidence of breakdown, no evidence of rash  Neurologic: Cranial nerves II through XII intact, motor strength is 5/5 in left . 2-/5 RIght  deltoid, bicep, tricep, grip, 5/5 left  0/5 right hip flexor, knee extensors, ankle dorsiflexor and plantar flexor,2- right hip ext Sensory exam unable to assess due to aphasia    Musculoskeletal:No RIght ankle edema, no hypersensitivity to touch, Achilles non tender , no pain with hip or knee ROM no knee effusion , + pain over ATFL right side and with passive ankle inversion   Assessment/Plan: 1. Functional deficits secondary to Cardioembolic Left MCA infarct  which require 3+ hours per day of interdisciplinary therapy in a comprehensive inpatient rehab setting.  Physiatrist is providing close team supervision  and 24 hour management of active medical problems listed below.  Physiatrist and rehab team continue to assess barriers to discharge/monitor patient progress toward functional and medical goals  Care Tool:  Bathing    Body parts bathed by patient: Left arm, Chest, Abdomen, Right upper leg, Left upper leg, Left lower leg, Face   Body parts bathed by helper: Front perineal area, Buttocks, Right lower leg, Right arm     Bathing assist Assist Level: Maximal Assistance - Patient 24 - 49%     Upper Body Dressing/Undressing Upper body dressing   What is the patient wearing?: Pull over shirt    Upper body assist Assist Level: Moderate Assistance - Patient 50 - 74%    Lower Body Dressing/Undressing Lower body dressing      What is the patient wearing?: Incontinence brief, Pants     Lower body assist Assist for lower body dressing: Maximal Assistance - Patient 25 - 49%     Toileting Toileting    Toileting assist Assist for toileting: 2 Helpers     Transfers Chair/bed transfer  Transfers assist  Chair/bed transfer activity did not occur: Safety/medical concerns  Chair/bed transfer assist level: Moderate Assistance - Patient 50 - 74% (squat pivot)     Locomotion Ambulation   Ambulation assist   Ambulation activity did not occur: Safety/medical concerns  Assist level: 2 helpers Assistive device:  (rail) Max distance: 5 ft   Walk 10 feet activity   Assist  Walk 10 feet activity did not occur: Safety/medical concerns  Walk 50 feet activity   Assist Walk 50 feet with 2 turns activity did not occur: Safety/medical concerns         Walk 150 feet activity   Assist Walk 150 feet activity did not occur: Safety/medical concerns         Walk 10 feet on uneven surface  activity   Assist Walk 10 feet on uneven surfaces activity did not occur: Safety/medical concerns         Wheelchair     Assist Will patient use wheelchair at  discharge?:  (TBD)             Wheelchair 50 feet with 2 turns activity    Assist            Wheelchair 150 feet activity     Assist          Blood pressure 104/77, pulse 84, temperature 97.7 F (36.5 C), temperature source Oral, resp. rate 20, weight 94.2 kg, last menstrual period 08/21/2011, SpO2 99 %.  Medical Problem List and Plan: 1.  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  Antithrombotics: -DVT/anticoagulation: Eliquis             -antiplatelet therapy: N/A 3. Pain Management: Neurontin 100 mg every 8 hours, Zanaflex 2 mg twice daily as needed  Appears controlled on 6/8             Monitor with increased exertion 4. Mood: Provide emotional support- cannot assess accurately related to severe aphasia, empirically started on citalopram for "flat affect"  Which may be MCA infarct effect              -antipsychotic agents: N/A 5. Neuropsych: This patient is not capable of making decisions on her own behalf. 6. Skin/Wound Care: Routine skin checks 7. Fluids/Electrolytes/Nutrition: Routine in and outs.   8.  Post stroke dysphagia.    Dysphagia #1 pudding thick liquids.  Follow-up speech therapy.  Advance diet as tolerated 9.  Hypertension.  Coreg 50 mg twice daily,Bidil 20-37.5 mg 2 tabs 3 times daily            Vitals:   01/12/20 1952 01/13/20 0612  BP: 100/67 104/77  Pulse: 82 84  Resp: 18 20  Temp: 97.7 F (36.5 C) 97.7 F (36.5 C)  SpO2: 100% 99%   Controlled on 6/10 10.  Atrial fibrillation.  Lanoxin 0.125 mg daily.  Cardiac rate controlled 11.  Chronic diastolic congestive heart failure.  Demadex 20 mg daily.  Monitor for any signs of fluid overload Filed Weights   01/11/20 0500 01/12/20 0505 01/13/20 0500  Weight: 92.9 kg 92.7 kg 94.2 kg   Stable on 6/7- with rising BUN  will reduce demadex dose 12.  Diabetes mellitus with peripheral neuropathy.  Hemoglobin A1c 11.6.  NovoLog 2 units every 4 hours, Lantus insulin 60 units nightly, Glucophage 500 mg twice daily CBG (last 3)  Recent Labs    01/12/20 1716 01/12/20 2114 01/13/20 0613  GLUCAP 101* 145* 161*    Increase to 70U, am CBG goal <180-met             Monitor with increased mobility 13.  COPD with tobacco abuse.  Continue nebulizer treatments 14.  Polysubstance abuse.  Urine drug screen positive cocaine.    Counsel when appropriate 15.  Hyperlipidemia.  Crestor 16.  GERD.  Protonix 17.  AKI Creat ok mainly prerenal azotemia with elevated BUN - no need to hold metformin at this time  18. RIght  ATFL sprain, order ankle splint, may use Ice    LOS: 8 days A FACE TO FACE EVALUATION WAS PERFORMED  Charlett Blake 01/13/2020, 7:26 AM

## 2020-01-13 NOTE — Progress Notes (Signed)
Physical Therapy Weekly Progress Note  Patient Details  Name: Robin [name] MRN: [id number] Date of Birth: 02-20-1966  Beginning of progress report period: January 06, 2020 End of progress report period: January 13, 2020  Today's Date: 01/13/2020 PT Individual Time: 1638-4665 and 1455-1539 PT Individual Time Calculation (min): 61 min and 44 min  Patient has met 2 of 3 short term goals. Robin [name] is progressing well with therapy despite inconsistent participation in skilled physical therapy this past week due to impaired awareness. She is performing supine<>sit with varying mod/max assist, sit<>stands with max assist of 1 using L hallway rail, squat pivot transfers with mod assist, and has progressed to gait training ~40f at L hallway rail with heavy max assist of 1 and +2 w/c follow. She continues to demonstrate R inattention with L head turn, R LE paresis with more activation proximally and minimal to no activation noted at ankle, as well as poor motor planning and aphasia impacting ability to follow cuing.  Patient continues to demonstrate the following deficits muscle weakness, muscle joint tightness and muscle paralysis, decreased cardiorespiratoy endurance, impaired timing and sequencing, abnormal tone, unbalanced muscle activation, motor apraxia, decreased coordination and decreased motor planning, decreased attention to right, decreased initiation, decreased attention, decreased awareness, decreased problem solving, decreased safety awareness, decreased memory and delayed processing and decreased sitting balance, decreased standing balance, decreased postural control and decreased balance strategies and therefore will continue to benefit from skilled PT intervention to increase functional independence with mobility.  Patient progressing toward long term goals..  Continue plan of care.  PT Short Term Goals Week 1:  PT Short Term Goal 1 (Week 1): Pt will perform supine<>sit with mod assist PT  Short Term Goal 1 - Progress (Week 1): Progressing toward goal PT Short Term Goal 2 (Week 1): Pt will perform sit<>stand using LRAD with max assist of 1 (not using lift) PT Short Term Goal 2 - Progress (Week 1): Met PT Short Term Goal 3 (Week 1): Pt will perform bed<>chair transfers with max assist of 2 (not using lift) PT Short Term Goal 3 - Progress (Week 1): Met Week 2:  PT Short Term Goal 1 (Week 2): Pt will perform supine<>sit with min assist PT Short Term Goal 2 (Week 2): Pt will perform bed<>chair transfers using LRAD with mod assist PT Short Term Goal 3 (Week 2): Pt will ambulate at least 324fusing LRAD with mod assist of 1 and +2 w/c follow if needed PT Short Term Goal 4 (Week 2): Pt will initiate stair navigation  Skilled Therapeutic Interventions/Progress Updates:  Ambulation/gait training;Community reintegration;DME/adaptive equipment instruction;Psychosocial support;Neuromuscular re-education;Stair training;UE/LE Strength taining/ROM;Wheelchair propulsion/positioning;Discharge planning;Balance/vestibular training;Functional electrical stimulation;Pain management;Skin care/wound management;Therapeutic Activities;UE/LE Coordination activities;Cognitive remediation/compensation;Disease management/prevention;Functional mobility training;Patient/family education;Splinting/orthotics;Therapeutic Exercise;Visual/perceptual remediation/compensation   Session 1: Pt received supine in bed with NT present starting to assist pt with breakfast. Therapist assumed care of patient and she became slightly irritated thinking she wasn't going to be able to eat therefore required encouragement to continue participating in meal but pt appears to not like the taste of the foot and ate very minimal. Once complete, pt agreeable to participate in OOB therapy session. Therapist initiated donning R LE TED hose when pt grimaces and tries to push therapist's hand away using her L foot - pt demonstrates increased  sensitivity to touch and especially ankle PROM. Supine>sitting R EOB with +2 mod assist for trunk upright and cuing for R hemibody management. Pt appears to be in slight distress/discomfort throughout session often  breathing out slightly heavily but pt unable to verbalize her thoughts despite questioning of basic needs. MD in/out for morning assessment and notified of R ankle pain. Once shown w/c pt agreeable to transfer OOB. L squat pivot with mod assist for lifting/pivoting hips and pt able to count "1, 2, 3" out loud to time transfer with therapist without cuing. Despite pt reporting not needing to use bathroom transported w/c into bathroom. L stand pivot to [facility] over toilet using grab bar with mod assist and manual facilitation for R LE management. Continent of bladder and bowels and pt reports feeling better. Standing with mod/max assist using grab bar while +2 assist performed total assist peri-care. R stand pivot back to w/c using grab bar requiring max assist in this direction as well as manual facilitation for R LE management. Pt agreeable to remain seated up in TIS w/c and left with needs in reach, seat belt alarm on, and RN notified of pt's position.  Session 2: Pt received supine in bed and agreeable to therapy session. Pt continues to require increased time to initiate OOB mobility often stating "wait" or "hold on" Supine>sitting R EOB with mod/max assist for trunk support. L squat pivot transfer to w/c with mod assist for lifting/pivoting hips and therapist facilitating R LE WBing and ensuring R ankle alignment as no brace in room yet.  Transported to/from gym in w/c for time management and energy conservation. Therapist donned R LE ankle DF assist ACE wrap. Participated in 3 gait training trials (~38f x2 and 182fx1) at L hallway rail with max assist of 1 and +2 for close w/c follow while using mirror feedback - pt able to initiate R LE swing phase though requires max/total assist for repositioning  prior to initiating stance phase due to excessive adduction, requires max/total assist to prevent R knee buckle during stance, and pt demonstrates strong R posterior lean throughout requiring assist and manual facilitation to stay upright as well as cuing for repositioning L hand on rail to improve posture. Performed R LE NMR task focusing on stance control via L LE stepping forward/backwards to external target - +2 mod assist via 3 [place] and max/total facilitation/assist for R hip/knee extension. Transported back to room and pt requested to return to bed. R squat pivot to EOB with mod assist for lifting/pivoting hips. Sit>supine with min assist for R LE management into the bed. Pt left supine with needs in reach and bed alarm on.  Therapy Documentation Precautions:  Precautions Precautions: Fall Precaution Comments: R hemiparesis, R inattention Restrictions Weight Bearing Restrictions: No  Pain: Session 1: R ankle pain - details above.  Session 2: No reports or signs of pain during this session though RN notified for prophylactic medication due to ambulation during therapy.  Therapy/Group: Individual Therapy  CaTawana ScalePT, DPT 01/13/2020, 7:47 AM

## 2020-01-14 ENCOUNTER — Inpatient Hospital Stay (HOSPITAL_COMMUNITY): Payer: 59 | Admitting: Occupational Therapy

## 2020-01-14 ENCOUNTER — Inpatient Hospital Stay (HOSPITAL_COMMUNITY): Payer: 59 | Admitting: Physical Therapy

## 2020-01-14 ENCOUNTER — Encounter (HOSPITAL_COMMUNITY): Payer: 59 | Admitting: Speech Pathology

## 2020-01-14 ENCOUNTER — Inpatient Hospital Stay (HOSPITAL_COMMUNITY): Payer: 59

## 2020-01-14 LAB — GLUCOSE, CAPILLARY
Glucose-Capillary: 151 mg/dL — ABNORMAL HIGH (ref 70–99)
Glucose-Capillary: 174 mg/dL — ABNORMAL HIGH (ref 70–99)
Glucose-Capillary: 89 mg/dL (ref 70–99)
Glucose-Capillary: 186 mg/dL — ABNORMAL HIGH (ref 70–99)

## 2020-01-14 LAB — BASIC METABOLIC PANEL
Anion gap: 8 (ref 5–15)
BUN: 24 mg/dL — ABNORMAL HIGH (ref 6–20)
CO2: 23 mmol/L (ref 22–32)
Calcium: 9.4 mg/dL (ref 8.9–10.3)
Chloride: 103 mmol/L (ref 98–111)
Creatinine, Ser: 1.04 mg/dL — ABNORMAL HIGH (ref 0.44–1.00)
GFR calc Af Amer: >60 mL/min (ref >60)
GFR calc non Af Amer: >60 mL/min (ref >60)
Glucose, Bld: 152 mg/dL — ABNORMAL HIGH (ref 70–99)
Potassium: 4.2 mmol/L (ref 3.5–5.1)
Sodium: 134 mmol/L — ABNORMAL LOW (ref 135–145)

## 2020-01-14 MED ORDER — CITALOPRAM HYDROBROMIDE 10 MG PO TABS
10.0000 mg | ORAL_TABLET | Freq: Every day | ORAL | Status: DC
  Administered 2020-01-15 – 2020-02-02 (×19): 10 mg
  Filled 2020-01-14 (×20): qty 1

## 2020-01-14 MED ORDER — METFORMIN HCL 500 MG PO TABS
500.0000 mg | ORAL_TABLET | Freq: Two times a day (BID) | ORAL | Status: DC
  Administered 2020-01-14 – 2020-01-24 (×20): 500 mg
  Filled 2020-01-14 (×20): qty 1

## 2020-01-14 MED ORDER — BETHANECHOL CHLORIDE 25 MG PO TABS
25.0000 mg | ORAL_TABLET | Freq: Three times a day (TID) | ORAL | Status: DC
  Administered 2020-01-14 – 2020-01-18 (×13): 25 mg
  Filled 2020-01-14 (×13): qty 1

## 2020-01-14 MED ORDER — BETHANECHOL CHLORIDE 25 MG PO TABS: 25.0000 mg | ORAL_TABLET | Freq: Three times a day (TID) | ORAL | Status: DC

## 2020-01-14 NOTE — Progress Notes (Signed)
Pt was bladder scanned for 770ml  and  in and out cathed at 2100, NT removed 814ml. Pt was sleeping throughout night, NT bladder scanned patient at 0323 and recived 122ml. This nurse in and out cathed patient at [place] due to pt resting and received 200 ml.  Orabelle Rylee

## 2020-01-14 NOTE — Progress Notes (Signed)
Orthopedic Tech Progress Note Patient Details:  Robin [name] [birth year] [id number] Spoke with MD about which ANKLE SPLINT he wanted for patient.  Ortho Devices Type of Ortho Device: ASO Ortho Device/Splint Location: RLE Ortho Device/Splint Interventions: Ordered, Application   Post Interventions Patient Tolerated: Well Instructions Provided: Care of device   Janit Pagan 01/14/2020, 9:59 AM

## 2020-01-14 NOTE — Progress Notes (Signed)
Occupational Therapy Session Note  Patient Details  Name: Robin [name] MRN: [id number] Date of Birth: [birth year]  Today's Date: 01/14/2020 OT Individual Time: 2025-4270 OT Individual Time Calculation (min): 29 min   Skilled Therapeutic Interventions/Progress Updates:    Pt greeted in the TIS with sister present. No c/o pain, wanting to lie back down in bed after session. Mod A for squat pivot<bed going towards Lt side. While EOB, worked on her sitting balance and Rt NMR while engaging in [place] and PROM activities. Pt exhibits increased activation in the shoulder with gravity minimized and also some gross grasp strength. OT facilitated weightbearing through the Rt elbow x5 with pt pushing herself back up into neutral seated position. Min A for returning to supine and pt was able to boost herself up with assist to stabilize the Rt LE. Once [facility] was elevated, pt drank thin water with full supervision and no s/s aspiration, vcs for slowing rate of consumption. Pt remained in bed with all needs within reach and bed alarm set.    Therapy Documentation Precautions:  Precautions Precautions: Fall Precaution Comments: R hemiparesis, R inattention Restrictions Weight Bearing Restrictions: No Pain: in Rt ankle, OT donned Rt ankle support and also positioned Rt foot in PRAFO at end of session. Pt stated these positioning interventions provided pain relief    ADL: ADL Grooming: Maximal assistance Where Assessed-Grooming: Wheelchair Upper Body Bathing: Maximal assistance Where Assessed-Upper Body Bathing: Wheelchair Lower Body Bathing: Other (comment) (total +2) Where Assessed-Lower Body Bathing: Other (Comment), Wheelchair Upper Body Dressing: Dependent Where Assessed-Upper Body Dressing: Wheelchair, Sitting at sink Lower Body Dressing: Other (Comment) (total +2) Where Assessed-Lower Body Dressing: Wheelchair, Sitting at sink, Standing at sink Toileting: Other (Comment) (total +2) Where  Assessed-Toileting: Bedside Commode Toilet Transfer: Other (comment) (total +2) Toilet Transfer Method: Other (comment) ([place] +2 assist)      Therapy/Group: Individual Therapy  Sriman Tally A Solimar Maiden 01/14/2020, 4:37 PM

## 2020-01-14 NOTE — Progress Notes (Signed)
[facility] PHYSICAL MEDICINE & REHABILITATION PROGRESS NOTE   Subjective/Complaints:  cathed overnite   ROS- cannot obtain due to aphasia/somnolence  Objective:   No results found. No results for input(s): WBC, HGB, HCT, PLT in the last 72 hours. No results for input(s): NA, K, CL, CO2, GLUCOSE, BUN, CREATININE, CALCIUM in the last 72 hours.  Intake/Output Summary (Last 24 hours) at 01/14/2020 0809 Last data filed at 01/14/2020 0532 Gross per 24 hour  Intake 0 ml  Output 1000 ml  Net -1000 ml     Physical Exam: Vital Signs Blood pressure (!) 106/59, pulse 83, temperature 98.2 F (36.8 C), temperature source Oral, resp. rate 16, weight 92.2 kg, last menstrual period 08/21/2011, SpO2 100 %.   General: No acute distress Mood and affect are appropriate Heart: Regular rate and rhythm no rubs murmurs or extra sounds Lungs: Clear to auscultation, breathing unlabored, no rales or wheezes Abdomen: Positive bowel sounds, soft nontender to palpation, nondistended Extremities: No clubbing, cyanosis, or edema Skin: No evidence of breakdown, no evidence of rash  Neurologic: Cranial nerves II through XII intact, motor strength is 5/5 in left . 2-/5 RIght  deltoid, bicep, tricep, grip, 5/5 left  0/5 right hip flexor, knee extensors, ankle dorsiflexor and plantar flexor,2- right hip ext Sensory exam unable to assess due to aphasia    Musculoskeletal:No RIght ankle edema, no hypersensitivity to touch, Achilles non tender , no pain with hip or knee ROM no knee effusion , + pain over ATFL right side and with passive ankle inversion   Assessment/Plan: 1. Functional deficits secondary to Cardioembolic Left MCA infarct  which require 3+ hours per day of interdisciplinary therapy in a comprehensive inpatient rehab setting.  Physiatrist is providing close team supervision and 24 hour management of active medical problems listed below.  Physiatrist and rehab team continue to assess barriers to  discharge/monitor patient progress toward functional and medical goals  Care Tool:  Bathing    Body parts bathed by patient: Left arm, Chest, Abdomen, Right upper leg, Left upper leg, Left lower leg, Face   Body parts bathed by helper: Front perineal area, Buttocks, Right lower leg, Right arm     Bathing assist Assist Level: Maximal Assistance - Patient 24 - 49%     Upper Body Dressing/Undressing Upper body dressing   What is the patient wearing?: Pull over shirt    Upper body assist Assist Level: Moderate Assistance - Patient 50 - 74%    Lower Body Dressing/Undressing Lower body dressing      What is the patient wearing?: Incontinence brief, Pants     Lower body assist Assist for lower body dressing: Maximal Assistance - Patient 25 - 49%     Toileting Toileting    Toileting assist Assist for toileting: 2 Helpers     Transfers Chair/bed transfer  Transfers assist  Chair/bed transfer activity did not occur: Safety/medical concerns  Chair/bed transfer assist level: Moderate Assistance - Patient 50 - 74% (squat pivot)     Locomotion Ambulation   Ambulation assist   Ambulation activity did not occur: Safety/medical concerns  Assist level: 2 helpers (max A 1 & +2 w/c follow) Assistive device: Other (comment) (L hallway rail) Max distance: 52f   Walk 10 feet activity   Assist  Walk 10 feet activity did not occur: Safety/medical concerns  Assist level: 2 helpers (max A 1 & +2 w/c follow) Assistive device: Other (comment) (L hallway rail)   Walk 50 feet activity   Assist  Walk 50 feet with 2 turns activity did not occur: Safety/medical concerns         Walk 150 feet activity   Assist Walk 150 feet activity did not occur: Safety/medical concerns         Walk 10 feet on uneven surface  activity   Assist Walk 10 feet on uneven surfaces activity did not occur: Safety/medical concerns         Wheelchair     Assist Will patient use  wheelchair at discharge?:  (TBD)             Wheelchair 50 feet with 2 turns activity    Assist            Wheelchair 150 feet activity     Assist          Blood pressure (!) 106/59, pulse 83, temperature 98.2 F (36.8 C), temperature source Oral, resp. rate 16, weight 92.2 kg, last menstrual period 08/21/2011, SpO2 100 %.  Medical Problem List and Plan: 1.  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  Antithrombotics: -DVT/anticoagulation: Eliquis             -antiplatelet therapy: N/A 3. Pain Management: Neurontin 100 mg every 8 hours, Zanaflex 2 mg twice daily as needed  Appears controlled on 6/8             Monitor with increased exertion 4. Mood: Provide emotional support- cannot assess accurately related to severe aphasia, empirically started on citalopram for "flat affect"  Which may be MCA infarct effect              -antipsychotic agents: N/A 5. Neuropsych: This patient is not capable of making decisions on her own behalf. 6. Skin/Wound Care: Routine skin checks 7. Fluids/Electrolytes/Nutrition: Routine in and outs.   8.  Post stroke dysphagia.    Dysphagia #1 pudding thick liquids.  Follow-up speech therapy.  Advance diet as tolerated 9.  Hypertension.  Coreg 50 mg twice daily,Bidil 20-37.5 mg 2 tabs 3 times daily            Vitals:   01/13/20 1946 01/14/20 0318  BP: 114/76 (!) 106/59  Pulse: 76 83  Resp: 20 16  Temp: 98.2 F (36.8 C) 98.2 F (36.8 C)  SpO2: 100% 100%   Controlled on 6/11 10.  Atrial fibrillation.  Lanoxin 0.125 mg daily.  Cardiac rate controlled 11.  Chronic diastolic congestive heart failure.  Demadex 20 mg daily.  Monitor for any signs of fluid overload Filed Weights   01/12/20 0505 01/13/20 0500 01/14/20 0318  Weight: 92.7 kg 94.2 kg 92.2 kg   Stable on  6/7- with rising BUN will reduce demadex dose 12.  Diabetes mellitus with peripheral neuropathy.  Hemoglobin A1c 11.6.  NovoLog 2 units every 4 hours, Lantus insulin 60 units nightly, Glucophage 500 mg twice daily CBG (last 3)  Recent Labs    01/13/20 1644 01/13/20 2132 01/14/20 0556  GLUCAP 129* 143* 151*    Increase to 70U, am CBG goal <180-met, controlled 6/11             Monitor with increased mobility 13.  COPD with tobacco abuse.  Continue nebulizer treatments 14.  Polysubstance abuse.  Urine drug screen positive cocaine.    Counsel when appropriate 15.  Hyperlipidemia.  Crestor 16.  GERD.  Protonix 17.  AKI Creat ok mainly prerenal azotemia with elevated BUN - no need to hold metformin at this time  18. RIght  ATFL sprain, order ankle splint, may use Ice   19.  Urinary retention may have diabetic cystopathy will add urecholine  LOS: 9 days A FACE TO FACE EVALUATION WAS PERFORMED  Charlett Blake 01/14/2020, 8:09 AM

## 2020-01-14 NOTE — Progress Notes (Signed)
Physical Therapy Session Note  Patient Details  Name: Robin [name] MRN: [id number] Date of Birth: [birth year]  Today's Date: 01/14/2020 PT Individual Time: 1105-1205 PT Individual Time Calculation (min): 60 min   Short Term Goals: Week 2:  PT Short Term Goal 1 (Week 2): Pt will perform supine<>sit with min assist PT Short Term Goal 2 (Week 2): Pt will perform bed<>chair transfers using LRAD with mod assist PT Short Term Goal 3 (Week 2): Pt will ambulate at least 33ft using LRAD with mod assist of 1 and +2 w/c follow if needed PT Short Term Goal 4 (Week 2): Pt will initiate stair navigation  Skilled Therapeutic Interventions/Progress Updates:    Pt received R sidelying, asleep in the bed and upon awakening agreeable to therapy session. Pt noted to be wearing R ankle brace - remained donned during session. Supine>sitting R EOB with max assist for LE management and primarily for trunk upright. L squat pivot to TIS w/c with mod assist for lifting/pivoting his - therapist providing manual facilitation for R LE positioning to promote weightbearing and monitor R ankle position. Pt gesturing to perform oral care at sink - performed with set-up assist using suction device toothbrush. Transported into bathroom in w/c and pt placed L foot up on toilet and declines need to use bathroom but with encouragement agreeable to attempt voiding. L stand pivot to [facility] over toilet with mod assist and pt using L UE support on grab bar - able to step R LE with min assist during transfer. Continent of bladder and bowels. Standing with L UE support on grab bar and mod assist for balance while +2 assist performed total assist LB clothing management and peri-care. R stand pivot back to w/c with max assist this direction and max assist for R LE stepping with pt still using L UE support on grab bar.  Transported to/from gym in w/c for time management and energy conservation. Sit<>stands TIS w/c to/from L hallway rail with min  assist for lifting and R LE management promoting weightbearing and improved LE alignment. Gait training at L hallway rail 53ft then 46ft x2 with heavy max assist of 1 and +2 w/c follow for safety - provided mirror feedback but minimal use - pt able to initiate brining R LE forward during swing but requires max assist for proper positioning prior to initiating stance phase - continues to require max/total assist for R LE hip/knee extension during stance control. Transported back to room and left seated in w/c tilted back with needs in reach and seat belt alarm on.  Therapy Documentation Precautions:  Precautions Precautions: Fall Precaution Comments: R hemiparesis, R inattention Restrictions Weight Bearing Restrictions: No  Pain: No indications of pain during this session. Was wearing R ankle brace throughout.   Therapy/Group: Individual Therapy  Tawana Scale, PT, DPT 01/14/2020, 8:03 AM

## 2020-01-14 NOTE — Progress Notes (Signed)
Modified Barium Swallow Progress Note  Patient Details  Name: Robin [name] MRN: [id number] Date of Birth: 12-11-1965  Today's Date: 01/14/2020  Modified Barium Swallow completed.  Full report located under Chart Review in the Imaging Section.  Brief recommendations include the following:  Clinical Impression   Pt presents with significantly improved oropharyngeal swallow function in comparison to last MBSS performed on 12/31/19. Although pt's oral phase is still remarkable for prolonged mastication and somewhat delayed AP transit of solids, mastication is becoming more efficient and she is achieving good oral clearance with purees and Dysphagia 2 (minced/ground) solids. During today's study, thin liquids intermittently left min lingual residue, which spilled prematurely into the pharynx after initial swallow, however this was sensed by the pt, who automatically triggered an additional swallow with airway protection remaining in tact. [name] frequently initiated swallow at the level of the pyriform sinuses, but in over 15 sips of thin barium via cup and straw, only trace penetration noted above the level of the vocal folds X3, with material being ejected from the vestibule in 2 out of 3 instances (PAS scale 2 and 3). No aspiration events observed. Pt also demonstrated ability to consume barium pill with good oral control, bolus cohesion, and airway protection. Recommend pt upgrade to thin liquids, continue Dysphagia 2 (minced/ground) solids, medications may be given whole with thin. Please provide full supervision to ensure use of small sips, slow rate, and sustained attention to intake.    Swallow Evaluation Recommendations       SLP Diet Recommendations: Dysphagia 2 (Fine chop) solids;Thin liquid   Liquid Administration via: Straw;Cup   Medication Administration: Whole meds with liquid   Supervision: Patient able to self feed;Full supervision/cueing for compensatory strategies    Compensations: Minimize environmental distractions;Slow rate;Small sips/bites;Monitor for anterior loss   Postural Changes: Seated upright at 90 degrees   Oral Care Recommendations: Oral care BID        Arbutus Leas 01/14/2020,9:43 AM

## 2020-01-14 NOTE — Progress Notes (Signed)
Occupational Therapy Session Note  Patient Details  Name: Robin [name] MRN: [id number] Date of Birth: Dec 13, 1965  Today's Date: 01/14/2020 OT Individual Time: 1305-1420 OT Individual Time Calculation (min): 75 min    Short Term Goals: Week 2:  OT Short Term Goal 1 (Week 2): Pt will complete UB bathing with no more than mod instructional cueing and min assist. OT Short Term Goal 2 (Week 2): Pt will donn a pullover shirt with no more than min assist and min demonstrational cueing following hemi dressing techniques. OT Short Term Goal 3 (Week 2): Pt will complete LB bathing with mod assist sit to stand for 2 consecutive sessions. OT Short Term Goal 4 (Week 2): Pt will use the RUE as a gross assist with selfcare tasks and mod facilitation.  Skilled Therapeutic Interventions/Progress Updates:    Pt up in the tilt in space wheelchair to start session.  Her sister was present for session as well to help encourage pt engagement.  She worked on Dance movement psychotherapist during session with transfer to the shower for bathing tasks.  The [place] was used for transfer into the shower with pt completing sit to stand with min assist on the [place] and then therapist wheeling her into the bathroom.  She was able to remove all of her old clothing with overall min assist and mod demonstrational cueing for UB and max assist for LB.  She completed shower with mod assist overall and max hand over hand assist for washing the RUE and RLE using the LUE.  Mod instructional cueing was needed for thoroughness and for remembering to wash her peri area and buttocks.  She completed stand pivot transfer to the wheelchair with mod assist in order to work on dressing at the sink.  She needed mod assist to donn a Administrator, [employer] with mod assist as well as for donning a brief and pants following hemi dressing techniques.  Finished session with pt up in the wheelchair in preparation for next session.  [name] sister also still in the room and  gave verbal encouragement and support throughout session.  Call button and phone in reach with safety alarm belt in place.     Therapy Documentation Precautions:  Precautions Precautions: Fall Precaution Comments: R hemiparesis, R inattention Restrictions Weight Bearing Restrictions: No  Pain: Pain Assessment Pain Scale: Faces Faces Pain Scale: Hurts a little bit Pain Type: Acute pain Pain Location: Flank Pain Orientation: Left Pain Descriptors / Indicators: Discomfort;Grimacing Pain Onset: With Activity Pain Intervention(s): Repositioned;Emotional support ADL: See Care Tool Section for some details of mobility and selfcare  Therapy/Group: Individual Therapy  Denver Harder OTR/L 01/14/2020, 4:56 PM

## 2020-01-15 LAB — GLUCOSE, CAPILLARY
Glucose-Capillary: 164 mg/dL — ABNORMAL HIGH (ref 70–99)
Glucose-Capillary: 155 mg/dL — ABNORMAL HIGH (ref 70–99)
Glucose-Capillary: 120 mg/dL — ABNORMAL HIGH (ref 70–99)
Glucose-Capillary: 159 mg/dL — ABNORMAL HIGH (ref 70–99)

## 2020-01-15 MED ORDER — ACETAMINOPHEN 325 MG PO TABS
650.0000 mg | ORAL_TABLET | ORAL | Status: DC | PRN
  Administered 2020-01-15 – 2020-01-31 (×15): 650 mg
  Filled 2020-01-15 (×17): qty 2

## 2020-01-15 MED ORDER — ACETAMINOPHEN 160 MG/5ML PO SOLN: 650.0000 mg | ORAL | Status: DC | PRN

## 2020-01-15 MED ORDER — ACETAMINOPHEN 650 MG RE SUPP: 650.0000 mg | RECTAL | Status: DC | PRN

## 2020-01-15 NOTE — Progress Notes (Signed)
Patient refusing to get out of bed and attempt to use the bathroom, patient told nurse "get out, bye". Nurse educated patient on importance of attempting to void. Patient refused. Patient also refused vital signs from tech. Patient also refused bladder scan.  [place]

## 2020-01-15 NOTE — Progress Notes (Signed)
[place] PHYSICAL MEDICINE & REHABILITATION PROGRESS NOTE   Subjective/Complaints:  Uneventful evening  ROS: Limited due to cognitive/behavioral    Objective:   DG Swallowing Func-Speech Pathology  Result Date: 01/14/2020 Objective Swallowing Evaluation: Type of Study: MBS-Modified Barium Swallow Study  Patient Details Name: Joie Hipps Bula MRN: [id number] Date of Birth: 07/04/1966 Today's Date: 01/14/2020 Past Medical History: Past Medical History: Diagnosis Date . Allergy  . Anemia  . Arthritis  . CHF (congestive heart failure) ([place])   a. EF at 30-35% by echo in 08/2018 b. EF at 35% by repeat echo in 05/2019 . Chronic abdominal pain  . Cocaine abuse ([place])  . COPD (chronic obstructive pulmonary disease) ([place])  . Essential hypertension, benign  . Fatty liver  . GERD (gastroesophageal reflux disease)  . Gout 2016 . Normal coronary arteries   3/10 - following abnormal Myoview . Ovarian cyst  . Type 2 diabetes mellitus ([place])  Past Surgical History: Past Surgical History: Procedure Laterality Date . ABDOMINAL HYSTERECTOMY  09/10/2011  Procedure: HYSTERECTOMY ABDOMINAL;  Surgeon: Jonnie Kind, MD;  Location: [facility];  Service: Gynecology;  Laterality: N/A;  Abdominal hysterectomy . CESAREAN SECTION  [id number], and 1994 . CHOLECYSTECTOMY  1995 . IR CT HEAD LTD  12/27/2019 . IR PERCUTANEOUS ART THROMBECTOMY/INFUSION INTRACRANIAL INC DIAG ANGIO  12/27/2019 . IR US GUIDE VASC ACCESS RIGHT  12/27/2019 . RADIOLOGY WITH ANESTHESIA N/A 12/27/2019  Procedure: IR WITH ANESTHESIA;  Surgeon: Luanne Bras, MD;  Location: [facility];  Service: Radiology;  Laterality: N/A; . SCAR REVISION  09/10/2011  Procedure: SCAR REVISION;  Surgeon: Jonnie Kind, MD;  Location: [facility];  Service: Gynecology;  Laterality: N/A;  Wide Excision of old Cicatrix . TUBAL LIGATION  1994 HPI: Pt is a 54 year old female admitted with acute L MCA CVA and L ICA occlusion s/p thrombectomy. ETT 5/24-5/27. PMH includes: DM, Afib, CHF, polysubstance  abuse, COPD, GERD, HTN. Pt admitted to [facility] 01/05/20.  Assessment / Plan / Recommendation CHL IP CLINICAL IMPRESSIONS 01/14/2020 Clinical Impression --Pt presents with significantly improved oropharyngeal swallow function in comparison to last MBSS performed on 12/31/19. Although pt's oral phase is still remarkable for prolonged mastication and somewhat delayed AP transit of solids, mastication is becoming more efficient and she is achieving good oral clearance with purees and Dysphagia 2 (minced/ground) solids. During today's study, thin liquids intermittently left min lingual residue, which spilled prematurely into the pharynx after initial swallow, however this was sensed by the pt, who automatically triggered an additional swallow with airway protection remaining in tact. [name] frequently initiated swallow at the level of the pyriform sinuses, but in over 15 sips of thin barium via cup and straw, only trace penetration noted above the level of the vocal folds X3, with material being ejected from the vestibule in 2 out of 3 instances (PAS scale 2 and 3). No aspiration events observed. Pt also demonstrated ability to consume barium pill with good oral control, bolus cohesion, and airway protection. Recommend pt upgrade to thin liquids, continue Dysphagia 2 (minced/ground) solids, medications may be given whole with thin. Please provide full supervision to ensure use of small sips, slow rate, and sustained attention to intake.  SLP Visit Diagnosis Dysphagia, oropharyngeal phase (R13.12) Attention and concentration deficit following -- Frontal lobe and executive function deficit following -- Impact on safety and function Mild aspiration risk   CHL IP TREATMENT RECOMMENDATION 12/31/2019 Treatment Recommendations Therapy as outlined in treatment plan below   Prognosis 12/31/2019 Prognosis for Safe Diet Advancement  Good Barriers to Reach Goals Language deficits Barriers/Prognosis Comment -- CHL IP DIET RECOMMENDATION 01/14/2020  SLP Diet Recommendations Dysphagia 2 (Fine chop) solids;Thin liquid Liquid Administration via Straw;Cup Medication Administration Whole meds with liquid Compensations Minimize environmental distractions;Slow rate;Small sips/bites;Monitor for anterior loss Postural Changes Seated upright at 90 degrees   CHL IP OTHER RECOMMENDATIONS 01/14/2020 Recommended Consults -- Oral Care Recommendations Oral care BID Other Recommendations --   CHL IP FOLLOW UP RECOMMENDATIONS 01/05/2020 Follow up Recommendations Inpatient Rehab   CHL IP FREQUENCY AND DURATION 12/31/2019 Speech Therapy Frequency (ACUTE ONLY) min 2x/week Treatment Duration 2 weeks      CHL IP ORAL PHASE 01/14/2020 Oral Phase Impaired Oral - Pudding Teaspoon -- Oral - Pudding Cup -- Oral - Honey Teaspoon NT Oral - Honey Cup NT Oral - Nectar Teaspoon NT Oral - Nectar Cup -- Oral - Nectar Straw -- Oral - Thin Teaspoon NT Oral - Thin Cup Piecemeal swallowing;Premature spillage Oral - Thin Straw Premature spillage;Piecemeal swallowing Oral - Puree Delayed oral transit;Lingual pumping Oral - Mech Soft Delayed oral transit;Impaired mastication Oral - Regular -- Oral - Multi-Consistency -- Oral - Pill WFL Oral Phase - Comment --  CHL IP PHARYNGEAL PHASE 01/14/2020 Pharyngeal Phase -- Pharyngeal- Pudding Teaspoon -- Pharyngeal -- Pharyngeal- Pudding Cup -- Pharyngeal -- Pharyngeal- Honey Teaspoon -- Pharyngeal -- Pharyngeal- Honey Cup -- Pharyngeal -- Pharyngeal- Nectar Teaspoon -- Pharyngeal -- Pharyngeal- Nectar Cup -- Pharyngeal -- Pharyngeal- Nectar Straw -- Pharyngeal -- Pharyngeal- Thin Teaspoon -- Pharyngeal -- Pharyngeal- Thin Cup Delayed swallow initiation-pyriform sinuses Pharyngeal -- Pharyngeal- Thin Straw Delayed swallow initiation-pyriform sinuses;Delayed swallow initiation-vallecula Pharyngeal -- Pharyngeal- Puree WFL Pharyngeal -- Pharyngeal- Mechanical Soft -- Pharyngeal -- Pharyngeal- Regular -- Pharyngeal -- Pharyngeal- Multi-consistency -- Pharyngeal --  Pharyngeal- Pill -- Pharyngeal -- Pharyngeal Comment --  CHL IP CERVICAL ESOPHAGEAL PHASE 01/14/2020 Cervical Esophageal Phase WFL Pudding Teaspoon -- Pudding Cup -- Honey Teaspoon -- Honey Cup -- Nectar Teaspoon -- Nectar Cup -- Nectar Straw -- Thin Teaspoon -- Thin Cup -- Thin Straw -- Puree -- Mechanical Soft -- Regular -- Multi-consistency -- Pill -- Cervical Esophageal Comment -- Arbutus Leas 01/14/2020, 9:46 AM              No results for input(s): WBC, HGB, HCT, PLT in the last 72 hours. Recent Labs    01/14/20 0559  NA 134*  K 4.2  CL 103  CO2 23  GLUCOSE 152*  BUN 24*  CREATININE 1.04*  CALCIUM 9.4    Intake/Output Summary (Last 24 hours) at 01/15/2020 1035 Last data filed at 01/15/2020 0700 Gross per 24 hour  Intake 401 ml  Output --  Net 401 ml     Physical Exam: Vital Signs Blood pressure 127/63, pulse 74, temperature 97.6 F (36.4 C), temperature source Oral, resp. rate 18, weight 92 kg, last menstrual period 08/21/2011, SpO2 100 %.   Constitutional: No distress . Vital signs reviewed. HEENT: EOMI, oral membranes moist, NGT Neck: supple Cardiovascular: RRR without murmur. No JVD    Respiratory/Chest: CTA Bilaterally without wheezes or rales. Normal effort    GI/Abdomen: BS +, non-tender, non-distended Ext: no clubbing, cyanosis, or edema Psych: pleasant and cooperative Skin: No evidence of breakdown, no evidence of rash  Neurologic: Slow to arouse. Cranial nerves II through XII intact, motor strength is grossly 4- 5/5 in left . 2-/5 RIght  deltoid, bicep, tricep, grip, 5/5 left  0/5 right hip flexor, knee extensors, ankle dorsiflexor and plantar flexor,2- right hip ext Sensory  withdraws to pain L>R     Musculoskeletal:No RIght ankle edema, no hypersensitivity to touch, Achilles non tender , no pain with hip or knee ROM no knee effusion , + pain over ATFL right side and with passive ankle inversion   Assessment/Plan: 1. Functional deficits secondary to  Cardioembolic Left MCA infarct  which require 3+ hours per day of interdisciplinary therapy in a comprehensive inpatient rehab setting.  Physiatrist is providing close team supervision and 24 hour management of active medical problems listed below.  Physiatrist and rehab team continue to assess barriers to discharge/monitor patient progress toward functional and medical goals  Care Tool:  Bathing    Body parts bathed by patient: Right arm, Chest, Abdomen, Front perineal area, Right upper leg, Left upper leg, Face   Body parts bathed by helper: Left arm, Buttocks, Left lower leg, Right lower leg     Bathing assist Assist Level: Moderate Assistance - Patient 50 - 74%     Upper Body Dressing/Undressing Upper body dressing   What is the patient wearing?: Pull over shirt    Upper body assist Assist Level: Moderate Assistance - Patient 50 - 74%    Lower Body Dressing/Undressing Lower body dressing      What is the patient wearing?: Incontinence brief, Pants     Lower body assist Assist for lower body dressing: Moderate Assistance - Patient 50 - 74%     Toileting Toileting    Toileting assist Assist for toileting: 2 Helpers     Transfers Chair/bed transfer  Transfers assist  Chair/bed transfer activity did not occur: Safety/medical concerns  Chair/bed transfer assist level: Moderate Assistance - Patient 50 - 74% (squat pivot)     Locomotion Ambulation   Ambulation assist   Ambulation activity did not occur: Safety/medical concerns  Assist level: 2 helpers (max A & +2 w/c follow) Assistive device: Other (comment) (L hallway rail) Max distance: 39f   Walk 10 feet activity   Assist  Walk 10 feet activity did not occur: Safety/medical concerns  Assist level: 2 helpers (max A & +2 w/c follow) Assistive device: Other (comment) (L hallway rail)   Walk 50 feet activity   Assist Walk 50 feet with 2 turns activity did not occur: Safety/medical concerns          Walk 150 feet activity   Assist Walk 150 feet activity did not occur: Safety/medical concerns         Walk 10 feet on uneven surface  activity   Assist Walk 10 feet on uneven surfaces activity did not occur: Safety/medical concerns         Wheelchair     Assist Will patient use wheelchair at discharge?:  (TBD)             Wheelchair 50 feet with 2 turns activity    Assist            Wheelchair 150 feet activity     Assist          Blood pressure 127/63, pulse 74, temperature 97.6 F (36.4 C), temperature source Oral, resp. rate 18, weight 92 kg, last menstrual period 08/21/2011, SpO2 100 %.  Medical Problem List and Plan: 1.  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  Antithrombotics: -DVT/anticoagulation: Eliquis             -antiplatelet  therapy: N/A 3. Pain Management: Neurontin 100 mg every 8 hours, Zanaflex 2 mg twice daily as needed  Appears controlled on 6/12             Monitor with increased exertion 4. Mood: Provide emotional support- cannot assess accurately related to severe aphasia, empirically started on citalopram for "flat affect"  Which may be MCA infarct effect              -antipsychotic agents: N/A 5. Neuropsych: This patient is not capable of making decisions on her own behalf. 6. Skin/Wound Care: Routine skin checks 7. Fluids/Electrolytes/Nutrition: Routine in and outs.   8.  Post stroke dysphagia.    Dysphagia #1 pudding thick liquids.  Follow-up speech therapy.  Advance diet as tolerated 9.  Hypertension.  Coreg 50 mg twice daily,Bidil 20-37.5 mg 2 tabs 3 times daily            Vitals:   01/15/20 0424 01/15/20 0756  BP: 127/63   Pulse: 78 74  Resp: 18   Temp: 97.6 F (36.4 C)   SpO2: 99% 100%   Controlled on 6/12 10.  Atrial  fibrillation.  Lanoxin 0.125 mg daily.  Cardiac rate controlled 11.  Chronic diastolic congestive heart failure.  Demadex 20 mg daily.  Monitor for any signs of fluid overload Filed Weights   01/13/20 0500 01/14/20 0318 01/15/20 0424  Weight: 94.2 kg 92.2 kg 92 kg   Stable on 6/7- with rising BUN will reduce demadex dose 12.  Diabetes mellitus with peripheral neuropathy.  Hemoglobin A1c 11.6.  NovoLog 2 units every 4 hours, Lantus insulin 60 units nightly, Glucophage 500 mg twice daily CBG (last 3)  Recent Labs    01/14/20 1641 01/14/20 2151 01/15/20 0615  GLUCAP 89 186* 164*    Increase to 70U, am CBG goal <180-met, improving control 6/12             Monitor with increased mobility 13.  COPD with tobacco abuse.  Continue nebulizer treatments 14.  Polysubstance abuse.  Urine drug screen positive cocaine.    Counsel when appropriate 15.  Hyperlipidemia.  Crestor 16.  GERD.  Protonix 17.  AKI Creat ok mainly prerenal azotemia with elevated BUN - no need to hold metformin at this time  18. RIght  ATFL sprain, order ankle splint, may use Ice   19.  Urinary retention may have diabetic cystopathy  -appears improved with urecholine, only one larger PVR (400cc) early this morning     LOS: 10 days A FACE TO FACE EVALUATION WAS PERFORMED  Meredith Staggers 01/15/2020, 10:35 AM

## 2020-01-16 ENCOUNTER — Inpatient Hospital Stay (HOSPITAL_COMMUNITY): Payer: 59 | Admitting: Speech Pathology

## 2020-01-16 ENCOUNTER — Inpatient Hospital Stay (HOSPITAL_COMMUNITY): Payer: 59 | Admitting: Occupational Therapy

## 2020-01-16 LAB — GLUCOSE, CAPILLARY
Glucose-Capillary: 146 mg/dL — ABNORMAL HIGH (ref 70–99)
Glucose-Capillary: 199 mg/dL — ABNORMAL HIGH (ref 70–99)
Glucose-Capillary: 151 mg/dL — ABNORMAL HIGH (ref 70–99)
Glucose-Capillary: 188 mg/dL — ABNORMAL HIGH (ref 70–99)

## 2020-01-16 NOTE — Progress Notes (Addendum)
Speech Language Pathology Daily Session Note  Patient Details  Name: Robin [name] MRN: [id number] Date of Birth: [birth year]  Today's Date: 01/16/2020 SLP Individual Time: 5621-3086 SLP Individual Time Calculation (min): 51 min  Short Term Goals: Week 2: SLP Short Term Goal 1 (Week 2): Pt will consume current diet with minimal overt s/sx aspiration and efficient mastication and oral clearance with Min A for use of safe swallow strategies. SLP Short Term Goal 2 (Week 2): Pt will consume trials of advanced solids, demonstrating efficient mastication and oral clearance with no more than Min A cues X3 prior to advancement. SLP Short Term Goal 3 (Week 2): Pt will name common objects with 25% accuracy provided Max A multimodal cues. SLP Short Term Goal 4 (Week 2): Pt will imitate at the phoneme level with 50% accuracy provided Max A multimodal cues. SLP Short Term Goal 5 (Week 2): Pt will express basic wants and needs via gestures or other multimodal means with Mod A cues. SLP Short Term Goal 6 (Week 2): Pt will sustain attention for functional tasks for 10 minute intervals with Mod A cues for redirection.  Skilled Therapeutic Interventions:  Pt was seen for skilled ST targeting goals for dysphagia and communication.  Therapist facilitated the session with a functional snack of dys 2 textures and thin liquids to assess toleration of recent diet upgrade.  Pt had no overt s/s of aspiration with liquids via straw or solids and oral phase was efficient for containing and clearing solids from the oral cavity.  Recommend that pt continue on her currently prescribed diet.  SLP also facilitated the session with structured naming tasks to address functional communication.  Pt was unable to name objects from pictures because of irregular and inconsistent errors with poor awareness despite max assist multimodal cues.  Pt was more accurate in automatic speech tasks such as singing and counting from 1-5 although her  ability to progress within tasks is limited by her inconsistent productions and decreased error awareness.  During session, pt began to grimace and become restless in bed.  Pt initially indicated that her right leg was hurting via gestures; however, she eventually began clutching her stomach.  She indicated that she needed to go to the bathroom via yes/no responses and when nursing was called for assistance, nurse tech informed therapist that pt was due for catheterization.  Given pt's limited ability to participate due to pain, session was ended early to allow nursing time to cath pt to improve pt comfort.  Pt was left in bed with bed alarm set and call bell within reach.  Continue per current plan of care.    Pain Pain Assessment Pain Scale: Faces Faces Pain Scale: Hurts even more Pain Type: Acute pain Pain Location: Abdomen Pain Descriptors / Indicators: Grimacing Pain Intervention(s): Other (Comment) (nursing aware)  Therapy/Group: Individual Therapy  Eliga Arvie, Selinda Orion 01/16/2020, 10:48 AM

## 2020-01-16 NOTE — Progress Notes (Signed)
[facility] PHYSICAL MEDICINE & REHABILITATION PROGRESS NOTE   Subjective/Complaints:  Up in bed. Denies any new complaints.   ROS: limited due to language/communication   Objective:   DG Swallowing Func-Speech Pathology  Result Date: 01/14/2020 Objective Swallowing Evaluation: Type of Study: MBS-Modified Barium Swallow Study  Patient Details Name: Robin [name] MRN: [id number] Date of Birth: [birth year] Today's Date: 01/14/2020 Past Medical History: Past Medical History: Diagnosis Date . Allergy  . Anemia  . Arthritis  . CHF (congestive heart failure) ([place])   a. EF at 30-35% by echo in 08/2018 b. EF at 35% by repeat echo in 05/2019 . Chronic abdominal pain  . Cocaine abuse ([place])  . COPD (chronic obstructive pulmonary disease) ([place])  . Essential hypertension, benign  . Fatty liver  . GERD (gastroesophageal reflux disease)  . Gout 2016 . Normal coronary arteries   3/10 - following abnormal Myoview . Ovarian cyst  . Type 2 diabetes mellitus ([place])  Past Surgical History: Past Surgical History: Procedure Laterality Date . ABDOMINAL HYSTERECTOMY  09/10/2011  Procedure: HYSTERECTOMY ABDOMINAL;  Surgeon: Jonnie Kind, MD;  Location: [facility];  Service: Gynecology;  Laterality: N/A;  Abdominal hysterectomy . CESAREAN SECTION  [id number], and 1994 . CHOLECYSTECTOMY  1995 . IR CT HEAD LTD  12/27/2019 . IR PERCUTANEOUS ART THROMBECTOMY/INFUSION INTRACRANIAL INC DIAG ANGIO  12/27/2019 . IR US GUIDE VASC ACCESS RIGHT  12/27/2019 . RADIOLOGY WITH ANESTHESIA N/A 12/27/2019  Procedure: IR WITH ANESTHESIA;  Surgeon: Luanne Bras, MD;  Location: [facility];  Service: Radiology;  Laterality: N/A; . SCAR REVISION  09/10/2011  Procedure: SCAR REVISION;  Surgeon: Jonnie Kind, MD;  Location: [facility];  Service: Gynecology;  Laterality: N/A;  Wide Excision of old Cicatrix . TUBAL LIGATION  1994 HPI: Pt is a 54 year old female admitted with acute L MCA CVA and L ICA occlusion s/p thrombectomy. ETT 5/24-5/27. PMH includes: DM, Afib,  CHF, polysubstance abuse, COPD, GERD, HTN. Pt admitted to [facility] 01/05/20.  Assessment / Plan / Recommendation CHL IP CLINICAL IMPRESSIONS 01/14/2020 Clinical Impression --Pt presents with significantly improved oropharyngeal swallow function in comparison to last MBSS performed on 12/31/19. Although pt's oral phase is still remarkable for prolonged mastication and somewhat delayed AP transit of solids, mastication is becoming more efficient and she is achieving good oral clearance with purees and Dysphagia 2 (minced/ground) solids. During today's study, thin liquids intermittently left min lingual residue, which spilled prematurely into the pharynx after initial swallow, however this was sensed by the pt, who automatically triggered an additional swallow with airway protection remaining in tact. [name] frequently initiated swallow at the level of the pyriform sinuses, but in over 15 sips of thin barium via cup and straw, only trace penetration noted above the level of the vocal folds X3, with material being ejected from the vestibule in 2 out of 3 instances (PAS scale 2 and 3). No aspiration events observed. Pt also demonstrated ability to consume barium pill with good oral control, bolus cohesion, and airway protection. Recommend pt upgrade to thin liquids, continue Dysphagia 2 (minced/ground) solids, medications may be given whole with thin. Please provide full supervision to ensure use of small sips, slow rate, and sustained attention to intake.  SLP Visit Diagnosis Dysphagia, oropharyngeal phase (R13.12) Attention and concentration deficit following -- Frontal lobe and executive function deficit following -- Impact on safety and function Mild aspiration risk   CHL IP TREATMENT RECOMMENDATION 12/31/2019 Treatment Recommendations Therapy as outlined in treatment plan below   Prognosis 12/31/2019  Prognosis for Safe Diet Advancement Good Barriers to Reach Goals Language deficits Barriers/Prognosis Comment -- CHL IP DIET  RECOMMENDATION 01/14/2020 SLP Diet Recommendations Dysphagia 2 (Fine chop) solids;Thin liquid Liquid Administration via Straw;Cup Medication Administration Whole meds with liquid Compensations Minimize environmental distractions;Slow rate;Small sips/bites;Monitor for anterior loss Postural Changes Seated upright at 90 degrees   CHL IP OTHER RECOMMENDATIONS 01/14/2020 Recommended Consults -- Oral Care Recommendations Oral care BID Other Recommendations --   CHL IP FOLLOW UP RECOMMENDATIONS 01/05/2020 Follow up Recommendations Inpatient Rehab   CHL IP FREQUENCY AND DURATION 12/31/2019 Speech Therapy Frequency (ACUTE ONLY) min 2x/week Treatment Duration 2 weeks      CHL IP ORAL PHASE 01/14/2020 Oral Phase Impaired Oral - Pudding Teaspoon -- Oral - Pudding Cup -- Oral - Honey Teaspoon NT Oral - Honey Cup NT Oral - Nectar Teaspoon NT Oral - Nectar Cup -- Oral - Nectar Straw -- Oral - Thin Teaspoon NT Oral - Thin Cup Piecemeal swallowing;Premature spillage Oral - Thin Straw Premature spillage;Piecemeal swallowing Oral - Puree Delayed oral transit;Lingual pumping Oral - Mech Soft Delayed oral transit;Impaired mastication Oral - Regular -- Oral - Multi-Consistency -- Oral - Pill WFL Oral Phase - Comment --  CHL IP PHARYNGEAL PHASE 01/14/2020 Pharyngeal Phase -- Pharyngeal- Pudding Teaspoon -- Pharyngeal -- Pharyngeal- Pudding Cup -- Pharyngeal -- Pharyngeal- Honey Teaspoon -- Pharyngeal -- Pharyngeal- Honey Cup -- Pharyngeal -- Pharyngeal- Nectar Teaspoon -- Pharyngeal -- Pharyngeal- Nectar Cup -- Pharyngeal -- Pharyngeal- Nectar Straw -- Pharyngeal -- Pharyngeal- Thin Teaspoon -- Pharyngeal -- Pharyngeal- Thin Cup Delayed swallow initiation-pyriform sinuses Pharyngeal -- Pharyngeal- Thin Straw Delayed swallow initiation-pyriform sinuses;Delayed swallow initiation-vallecula Pharyngeal -- Pharyngeal- Puree WFL Pharyngeal -- Pharyngeal- Mechanical Soft -- Pharyngeal -- Pharyngeal- Regular -- Pharyngeal -- Pharyngeal-  Multi-consistency -- Pharyngeal -- Pharyngeal- Pill -- Pharyngeal -- Pharyngeal Comment --  CHL IP CERVICAL ESOPHAGEAL PHASE 01/14/2020 Cervical Esophageal Phase WFL Pudding Teaspoon -- Pudding Cup -- Honey Teaspoon -- Honey Cup -- Nectar Teaspoon -- Nectar Cup -- Nectar Straw -- Thin Teaspoon -- Thin Cup -- Thin Straw -- Puree -- Mechanical Soft -- Regular -- Multi-consistency -- Pill -- Cervical Esophageal Comment -- Arbutus Leas 01/14/2020, 9:46 AM              No results for input(s): WBC, HGB, HCT, PLT in the last 72 hours. Recent Labs    01/14/20 0559  NA 134*  K 4.2  CL 103  CO2 23  GLUCOSE 152*  BUN 24*  CREATININE 1.04*  CALCIUM 9.4    Intake/Output Summary (Last 24 hours) at 01/16/2020 0929 Last data filed at 01/16/2020 0900 Gross per 24 hour  Intake 420 ml  Output 500 ml  Net -80 ml     Physical Exam: Vital Signs Blood pressure 130/78, pulse 75, temperature 97.6 F (36.4 C), temperature source Oral, resp. rate 16, weight 95.9 kg, last menstrual period 08/21/2011, SpO2 100 %.   Constitutional: No distress . Vital signs reviewed. HEENT: EOMI, oral membranes moist, NGT Neck: supple Cardiovascular: RRR without murmur. No JVD    Respiratory/Chest: CTA Bilaterally without wheezes or rales. Normal effort    GI/Abdomen: BS +, non-tender, non-distended Ext: no clubbing, cyanosis, or edema Psych: pleasant and cooperative Skin: No evidence of breakdown, no evidence of rash  Neurologic: awake, aphasic, language limited, inconsistent. Cranial nerves II through XII intact, motor strength is grossly 4- 5/5 in left . 2-/5 RIght  deltoid, bicep, tricep, grip, 5/5 left  0/5 right hip flexor, knee extensors, ankle  dorsiflexor and plantar flexor,2- right hip ext Sensory withdraws to pain L>R     Musculoskeletal:  + pain over ATFL right side and with passive ankle inversion, wearing ASO   Assessment/Plan: 1. Functional deficits secondary to Cardioembolic Left MCA infarct  which require  3+ hours per day of interdisciplinary therapy in a comprehensive inpatient rehab setting.  Physiatrist is providing close team supervision and 24 hour management of active medical problems listed below.  Physiatrist and rehab team continue to assess barriers to discharge/monitor patient progress toward functional and medical goals  Care Tool:  Bathing    Body parts bathed by patient: Right arm, Chest, Abdomen, Front perineal area, Right upper leg, Left upper leg, Face   Body parts bathed by helper: Left arm, Buttocks, Left lower leg, Right lower leg     Bathing assist Assist Level: Moderate Assistance - Patient 50 - 74%     Upper Body Dressing/Undressing Upper body dressing   What is the patient wearing?: Pull over shirt    Upper body assist Assist Level: Moderate Assistance - Patient 50 - 74%    Lower Body Dressing/Undressing Lower body dressing      What is the patient wearing?: Incontinence brief, Pants     Lower body assist Assist for lower body dressing: Moderate Assistance - Patient 50 - 74%     Toileting Toileting    Toileting assist Assist for toileting: 2 Helpers     Transfers Chair/bed transfer  Transfers assist  Chair/bed transfer activity did not occur: Safety/medical concerns  Chair/bed transfer assist level:  (squat pivot)     Locomotion Ambulation   Ambulation assist   Ambulation activity did not occur: Safety/medical concerns  Assist level: 2 helpers (max A & +2 w/c follow) Assistive device: Other (comment) (L hallway rail) Max distance: 20f   Walk 10 feet activity   Assist  Walk 10 feet activity did not occur: Safety/medical concerns  Assist level: 2 helpers (max A & +2 w/c follow) Assistive device: Other (comment) (L hallway rail)   Walk 50 feet activity   Assist Walk 50 feet with 2 turns activity did not occur: Safety/medical concerns         Walk 150 feet activity   Assist Walk 150 feet activity did not occur:  Safety/medical concerns         Walk 10 feet on uneven surface  activity   Assist Walk 10 feet on uneven surfaces activity did not occur: Safety/medical concerns         Wheelchair     Assist Will patient use wheelchair at discharge?:  (TBD)             Wheelchair 50 feet with 2 turns activity    Assist            Wheelchair 150 feet activity     Assist          Blood pressure 130/78, pulse 75, temperature 97.6 F (36.4 C), temperature source Oral, resp. rate 16, weight 95.9 kg, last menstrual period 08/21/2011, SpO2 100 %.  Medical Problem List and Plan: 1.  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  Antithrombotics: -DVT/anticoagulation: Eliquis             -antiplatelet therapy: N/A 3. Pain Management: Neurontin 100 mg every 8 hours, Zanaflex 2 mg twice daily as  needed  Appears controlled on 6/13             Monitor with increased exertion 4. Mood: Provide emotional support- cannot assess accurately related to severe aphasia, empirically started on citalopram for "flat affect"  Which may be MCA infarct effect              -antipsychotic agents: N/A 5. Neuropsych: This patient is not capable of making decisions on her own behalf. 6. Skin/Wound Care: Routine skin checks 7. Fluids/Electrolytes/Nutrition: Routine in and outs.   8.  Post stroke dysphagia.    Dysphagia #1 pudding thick liquids.  Follow-up speech therapy.  Advance diet as tolerated 9.  Hypertension.  Coreg 50 mg twice daily,Bidil 20-37.5 mg 2 tabs 3 times daily            Vitals:   01/15/20 2033 01/16/20 0509  BP: 123/74 130/78  Pulse: 79 75  Resp: 18 16  Temp: (!) 97.3 F (36.3 C) 97.6 F (36.4 C)  SpO2: 98% 100%   Controlled on 6/13 10.  Atrial fibrillation.  Lanoxin 0.125 mg daily.  Cardiac rate  controlled 11.  Chronic diastolic congestive heart failure.  Demadex 20 mg daily.  Monitor for any signs of fluid overload Filed Weights   01/14/20 0318 01/15/20 0424 01/16/20 0509  Weight: 92.2 kg 92 kg 95.9 kg   Stable on 6/7- with rising BUN  demadex dose reduced 12.  Diabetes mellitus with peripheral neuropathy.  Hemoglobin A1c 11.6.  NovoLog 2 units every 4 hours, Lantus insulin 60 units nightly, Glucophage 500 mg twice daily CBG (last 3)  Recent Labs    01/15/20 1653 01/15/20 2056 01/16/20 0645  GLUCAP 120* 159* 146*    Increase to 70U, am CBG goal <180-met, improving control 6/13             Monitor with increased mobility 13.  COPD with tobacco abuse.  Continue nebulizer treatments 14.  Polysubstance abuse.  Urine drug screen positive cocaine.    Counsel when appropriate 15.  Hyperlipidemia.  Crestor 16.  GERD.  Protonix 17.  AKI Creat ok mainly prerenal azotemia with elevated BUN - no need to hold metformin at this time  18. RIght  ATFL sprain, order ankle splint, may use Ice   19.  Urinary retention may have diabetic cystopathy  -appears improved with urecholine--continue   LOS: 11 days A FACE TO FACE EVALUATION WAS PERFORMED  Meredith Staggers 01/16/2020, 9:29 AM

## 2020-01-17 ENCOUNTER — Inpatient Hospital Stay (HOSPITAL_COMMUNITY): Payer: 59 | Admitting: Speech Pathology

## 2020-01-17 ENCOUNTER — Inpatient Hospital Stay (HOSPITAL_COMMUNITY): Payer: 59 | Admitting: Occupational Therapy

## 2020-01-17 ENCOUNTER — Inpatient Hospital Stay (HOSPITAL_COMMUNITY): Payer: 59

## 2020-01-17 LAB — GLUCOSE, CAPILLARY
Glucose-Capillary: 179 mg/dL — ABNORMAL HIGH (ref 70–99)
Glucose-Capillary: 190 mg/dL — ABNORMAL HIGH (ref 70–99)
Glucose-Capillary: 133 mg/dL — ABNORMAL HIGH (ref 70–99)
Glucose-Capillary: 243 mg/dL — ABNORMAL HIGH (ref 70–99)

## 2020-01-17 MED ORDER — MEGESTROL ACETATE 400 MG/10ML PO SUSP
400.0000 mg | Freq: Two times a day (BID) | ORAL | Status: DC
  Administered 2020-01-17 – 2020-01-25 (×17): 400 mg via ORAL
  Filled 2020-01-17 (×16): qty 10

## 2020-01-17 MED ORDER — GLUCERNA 1.2 CAL PO LIQD
720.0000 mL | ORAL | Status: DC
  Administered 2020-01-17 (×2): 720 mL
  Filled 2020-01-17: qty 1000
  Filled 2020-01-17: qty 948

## 2020-01-17 NOTE — Progress Notes (Addendum)
Occupational Therapy Session Note  Patient Details  Name: Robin [name] MRN: [id number] Date of Birth: May 08, 1966  Today's Date: 01/17/2020 OT Individual Time: 3403-7096 OT Individual Time Calculation (min): 58 min    Short Term Goals: Week 2:  OT Short Term Goal 1 (Week 2): Pt will complete UB bathing with no more than mod instructional cueing and min assist. OT Short Term Goal 2 (Week 2): Pt will donn a pullover shirt with no more than min assist and min demonstrational cueing following hemi dressing techniques. OT Short Term Goal 3 (Week 2): Pt will complete LB bathing with mod assist sit to stand for 2 consecutive sessions. OT Short Term Goal 4 (Week 2): Pt will use the RUE as a gross assist with selfcare tasks and mod facilitation.  Skilled Therapeutic Interventions/Progress Updates:    Pt completed transfer from her tilt in space wheelchair to the therapy mat with mod assist squat pivot.  Had her work on Clinical research associate in sitting.  She was able to pick up bean bags from the bedside table with mod facilitation to place in container at knee level.  Therapist also applied NMES to the right volar forearm extensors using the [employer]o Stim One for approximately 10 mins at 5 clicks of intensity.  See below for parameter settings.  She was able to tolerate the stimulation without any adverse reactions to the extensors, however when applied to the flexors of the arm, she was not able to tolerate it.  Had her finish RUE work with [employer] for shoulder and elbow by washing the bedside table with a washcloth and overall max assist.  She reported the need to complete toileting, so transfer to the wheelchair was completed with mod assist as well as for transfer to the 3:1 over the toilet.  She needed max assist for clothing management in standing as well as toilet hygiene.  Finished with transfer back to the wheelchair with max assist stand pivot to the right.  Finished session with pt in the  wheelchair with safety belt in place and call button and phone in reach.  Pt still severely aphasic during session, but able to maintain greater attention to tasks compared to other sessions.    Saebo Stim One 330 pulse width 35 Hz pulse rate On 8 sec/ off 8 sec Ramp up/ down 2 sec Symmetrical Biphasic wave form  Max intensity 188mA at 500 Ohm load   Therapy Documentation Precautions:  Precautions Precautions: Fall Precaution Comments: R hemiparesis, R inattention Restrictions Weight Bearing Restrictions: No  Pain: Pain Assessment Pain Scale: Faces Pain Score: 0-No pain Faces Pain Scale: Hurts little more Pain Type: Acute pain Pain Location: Foot Pain Orientation: Right Pain Descriptors / Indicators: Discomfort ADL: See Care Tool Section for some details of mobility and selfcare  Therapy/Group: Individual Therapy  Pankaj Haack OTR/L 01/17/2020, 12:36 PM

## 2020-01-17 NOTE — Progress Notes (Signed)
[facility] PHYSICAL MEDICINE & REHABILITATION PROGRESS NOTE   Subjective/Complaints:  No issues over weekend, reviewed intake, poor but asking to eat more for breakfast today   ROS: limited due to language/communication   Objective:   No results found. No results for input(s): WBC, HGB, HCT, PLT in the last 72 hours. No results for input(s): NA, K, CL, CO2, GLUCOSE, BUN, CREATININE, CALCIUM in the last 72 hours.  Intake/Output Summary (Last 24 hours) at 01/17/2020 0824 Last data filed at 01/17/2020 0600 Gross per 24 hour  Intake 1720 ml  Output 100 ml  Net 1620 ml     Physical Exam: Vital Signs Blood pressure 137/90, pulse 79, temperature 98.3 F (36.8 C), temperature source Oral, resp. rate 18, weight 96 kg, last menstrual period 08/21/2011, SpO2 98 %.   Constitutional: No distress . Vital signs reviewed. HEENT: EOMI, oral membranes moist, NGT Neck: supple Cardiovascular: RRR without murmur. No JVD    Respiratory/Chest: CTA Bilaterally without wheezes or rales. Normal effort    GI/Abdomen: BS +, non-tender, non-distended Ext: no clubbing, cyanosis, or edema Psych: pleasant and cooperative Skin: No evidence of breakdown, no evidence of rash  Neurologic: awake, aphasic, language limited, inconsistent. Cranial nerves II through XII intact, motor strength is grossly 4- 5/5 in left . 2-/5 RIght  deltoid, bicep, tricep, grip, 5/5 left  0/5 right hip flexor, knee extensors, ankle dorsiflexor and plantar flexor,2- right hip ext Sensory withdraws to pain L>R     Musculoskeletal:wearing ASO   Assessment/Plan: 1. Functional deficits secondary to Cardioembolic Left MCA infarct  which require 3+ hours per day of interdisciplinary therapy in a comprehensive inpatient rehab setting.  Physiatrist is providing close team supervision and 24 hour management of active medical problems listed below.  Physiatrist and rehab team continue to assess barriers to discharge/monitor patient  progress toward functional and medical goals  Care Tool:  Bathing    Body parts bathed by patient: Right arm, Chest, Abdomen, Front perineal area, Right upper leg, Left upper leg, Face   Body parts bathed by helper: Left arm, Buttocks, Left lower leg, Right lower leg     Bathing assist Assist Level: Moderate Assistance - Patient 50 - 74%     Upper Body Dressing/Undressing Upper body dressing   What is the patient wearing?: Pull over shirt    Upper body assist Assist Level: Moderate Assistance - Patient 50 - 74%    Lower Body Dressing/Undressing Lower body dressing      What is the patient wearing?: Incontinence brief, Pants     Lower body assist Assist for lower body dressing: Moderate Assistance - Patient 50 - 74%     Toileting Toileting    Toileting assist Assist for toileting: 2 Helpers     Transfers Chair/bed transfer  Transfers assist  Chair/bed transfer activity did not occur: Safety/medical concerns  Chair/bed transfer assist level:  (squat pivot)     Locomotion Ambulation   Ambulation assist   Ambulation activity did not occur: Safety/medical concerns  Assist level: 2 helpers (max A & +2 w/c follow) Assistive device: Other (comment) (L hallway rail) Max distance: 35f   Walk 10 feet activity   Assist  Walk 10 feet activity did not occur: Safety/medical concerns  Assist level: 2 helpers (max A & +2 w/c follow) Assistive device: Other (comment) (L hallway rail)   Walk 50 feet activity   Assist Walk 50 feet with 2 turns activity did not occur: Safety/medical concerns  Walk 150 feet activity   Assist Walk 150 feet activity did not occur: Safety/medical concerns         Walk 10 feet on uneven surface  activity   Assist Walk 10 feet on uneven surfaces activity did not occur: Safety/medical concerns         Wheelchair     Assist Will patient use wheelchair at discharge?:  (TBD)             Wheelchair 50  feet with 2 turns activity    Assist            Wheelchair 150 feet activity     Assist          Blood pressure 137/90, pulse 79, temperature 98.3 F (36.8 C), temperature source Oral, resp. rate 18, weight 96 kg, last menstrual period 08/21/2011, SpO2 98 %.  Medical Problem List and Plan: 1.  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  Antithrombotics: -DVT/anticoagulation: Eliquis             -antiplatelet therapy: N/A 3. Pain Management: Neurontin 100 mg every 8 hours, Zanaflex 2 mg twice daily as needed  Appears controlled on 6/13             Monitor with increased exertion 4. Mood: Provide emotional support- cannot assess accurately related to severe aphasia, empirically started on citalopram for "flat affect"  Which may be MCA infarct effect              -antipsychotic agents: N/A 5. Neuropsych: This patient is not capable of making decisions on her own behalf. 6. Skin/Wound Care: Routine skin checks 7. Fluids/Electrolytes/Nutrition: Routine in and outs.  Oral intake poor , variable , 30-75% will add megace  8.  Post stroke dysphagia.    Dysphagia #1 pudding thick liquids.  Follow-up speech therapy.  Advance diet as tolerated 9.  Hypertension.  Coreg 50 mg twice daily,Bidil 20-37.5 mg 2 tabs 3 times daily            Vitals:   01/16/20 1944 01/17/20 0531  BP: 115/64 137/90  Pulse: 76 79  Resp: 16 18  Temp: 98.3 F (36.8 C) 98.3 F (36.8 C)  SpO2: 100% 98%   Controlled on 6/14 10.  Atrial fibrillation.  Lanoxin 0.125 mg daily.  Cardiac rate controlled 11.  Chronic diastolic congestive heart failure.  Demadex 20 mg daily.  Monitor for any signs of fluid overload Filed Weights   01/15/20 0424 01/16/20 0509 01/17/20 0500  Weight: 92 kg 95.9 kg 96 kg   Stable on 6/7- with rising BUN   demadex dose reduced 12.  Diabetes mellitus with peripheral neuropathy.  Hemoglobin A1c 11.6.  NovoLog 2 units every 4 hours, Lantus insulin 60 units nightly, Glucophage 500 mg twice daily CBG (last 3)  Recent Labs    01/16/20 1723 01/16/20 2108 01/17/20 0634  GLUCAP 151* 188* 179*    Increase to 70U, am CBG goal <180-met, improving control 6/13             Monitor with increased mobility 13.  COPD with tobacco abuse.  Continue nebulizer treatments 14.  Polysubstance abuse.  Urine drug screen positive cocaine.    Counsel when appropriate 15.  Hyperlipidemia.  Crestor 16.  GERD.  Protonix 17.  AKI Creat ok mainly prerenal azotemia with elevated BUN -  no need to hold metformin at this time  18. RIght  ATFL sprain, order ankle splint, may use Ice , Remove at noc  19.  Urinary retention may have diabetic cystopathy  -appears improved with urecholine--continue, has required 2 caths over the weekend both at night , several low residuals since   LOS: 12 days A FACE TO FACE EVALUATION WAS PERFORMED  Charlett Blake 01/17/2020, 8:24 AM

## 2020-01-17 NOTE — Progress Notes (Signed)
Nutrition Follow-up  DOCUMENTATION CODES:   Obesity unspecified  INTERVENTION:   Decrease nocturnal tube feeds via Cortrak: - Glucerna 1.2 @ 60 ml/hr to run for 12 hours from 1800 to 0600 (total of 720 ml) - Pro-stat 30 ml BID - Free water per MD, currently 250 ml q 6 hours  Tube feeding regimenand free waterprovides1064kcal, 73grams of protein, and 1526ml of H2O (59% ofkcalneeds, 73% of protein needs).  - Continue MVI with minerals daily  NUTRITION DIAGNOSIS:   Inadequate oral intake related to dysphagia as evidenced by meal completion < 25%.  Ongoing, being addressed via TF  GOAL:   Patient will meet greater than or equal to 90% of their needs  Progressing  MONITOR:   PO intake, Diet advancement, Labs, Weight trends, TF tolerance  REASON FOR ASSESSMENT:   Consult Enteral/tube feeding initiation and management  ASSESSMENT:   54 year old female with PMH of CHF, HTN, atrial fibrillation, COPD, polysubstance abuse, DM, and tobacco abuse. Presented on 12/27/19 with right hemiparesis, AMS, and aphasia. MRI showed evolving left MCA infarction with hemorrhagic transformation. Pt underwent mechanical thrombectomy per interventional radiology and required intubation and was extubated 12/28/19. Started on dysphagia 1 diet with pudding-thick liquids with Cortrak tube in place for nutritional support. Admitted to CIR on 01/05/20.  6/11 - MBS, diet advanced to dysphagia 2 with thin liquids  PO intake improving somewhat since diet advanced. RD will decrease rate of nocturnal TF again to promote PO intake during the day.  Weight trending up over the last few days. Will continue to monitor. Pt with non-pitting edema to RUE per RN edema assessment.  Meal Completion: 25-75% x last 5 meals  Medications reviewed and include: SSI, Lantus 68 units daily, Megace 400 mg BID, Metformin, MVI with minerals, protonix, klor-con 40 mEq daily, torsemide  Labs reviewed. CBG's: 151-190 x  24 hours  Diet Order:   Diet Order            DIET DYS 2 Room service appropriate? Yes; Fluid consistency: Thin  Diet effective now                 EDUCATION NEEDS:   No education needs have been identified at this time  Skin:  Skin Assessment: Reviewed RN Assessment (MASD perineum, blister to right thigh)  Last BM:  01/17/20 medium type 4  Height:   Ht Readings from Last 1 Encounters:  12/31/19 5\' 6"  (1.676 m)    Weight:   Wt Readings from Last 1 Encounters:  01/17/20 96 kg    Ideal Body Weight:  59.1 kg  BMI:  Body mass index is 34.16 kg/m.  Estimated Nutritional Needs:   Kcal:  1800-2000  Protein:  100-115 grams  Fluid:  1.8-2.0 L    Gaynell Face, MS, RD, LDN Inpatient Clinical Dietitian Pager: [phone] Weekend/After Hours: [phone]

## 2020-01-17 NOTE — Progress Notes (Signed)
Physical Therapy Session Note  Patient Details  Name: Robin [name] MRN: [id number] Date of Birth: 1966-07-06  Today's Date: 01/17/2020 PT Individual Time: 0800-0915 PT Individual Time Calculation (min): 75 min   Short Term Goals: Week 2:  PT Short Term Goal 1 (Week 2): Pt will perform supine<>sit with min assist PT Short Term Goal 2 (Week 2): Pt will perform bed<>chair transfers using LRAD with mod assist PT Short Term Goal 3 (Week 2): Pt will ambulate at least 40ft using LRAD with mod assist of 1 and +2 w/c follow if needed PT Short Term Goal 4 (Week 2): Pt will initiate stair navigation Week 3:     Skilled Therapeutic Interventions/Progress Updates:  Pt eating finishing up breakfast, in bed, assisted by NT.  Pt indicated that she was finished.  After resting in supine a few minutes, she indicated that she wanted to eat more.  With [facility] raised, pt ate more eggs, apple sauce, and drank juice.  She was not receptive to multimodal cues for small bites, slowly, but did not pocket and swallowed well q bite.  Pt indicated that she was finished again; PT informed NT that pt had eaten more.  PT donned TEDS and R ankle brace.  In supine, rolling L><R with min assist for assisting PT in donning pants.  R sidelying> sitting in flat bed no rails, mod assist.  Lateral scoot transfer to R bed> w/c slightly downhill, +2.  PT lowered bil footrests for improved pelvofemoral positioning.  neuromuscular re-education in supine- via multimodal cues for RLE mass extension for passively flexed position by PT, x 10 with hip and knee extension noted. PT provided PROM R ankle.  In sitting- via demo and multimodal cues for bil hip abduction/adduction, and kicking ball with R foot.  Seated in w/c- with use of Kinetron at 50 cm/sec,  Alternating reciprocal LEs movements, x 1 minute; using RLE only with PT operating opposite footplate, x 20 cycles.  Active motors noted R hip and knee.  Gait training with L shoe on, R  ankle brace, ACE on R knee, and shoe cover on R foot (no shoe) for better clearance of RLE, x 10', x 20' using L railing in hallway.  Sit> stand with min assist, pt pulling up on railing.  Max assist needed for RLE stance stability; pt able to advance q step 50% of distance.  At end of session, pt in wc tilted back  slightly with seat belt alarm set and needs at hand.  RUE supported by pillow.     Therapy Documentation Precautions:  Precautions Precautions: Fall Precaution Comments: R hemiparesis, R inattention Restrictions Weight Bearing Restrictions: No       Therapy/Group: Individual Therapy  Scarleth Brame 01/17/2020, 9:28 AM

## 2020-01-17 NOTE — Progress Notes (Signed)
Speech Language Pathology Daily Session Note  Patient Details  Name: Tyreesha Maharaj Mierzwa MRN: [id number] Date of Birth: 1965/09/06  Today's Date: 01/17/2020 SLP Individual Time: 1400-1415 SLP Individual Time Calculation (min): 15 min  Short Term Goals: Week 2: SLP Short Term Goal 1 (Week 2): Pt will consume current diet with minimal overt s/sx aspiration and efficient mastication and oral clearance with Min A for use of safe swallow strategies. SLP Short Term Goal 2 (Week 2): Pt will consume trials of advanced solids, demonstrating efficient mastication and oral clearance with no more than Min A cues X3 prior to advancement. SLP Short Term Goal 3 (Week 2): Pt will name common objects with 25% accuracy provided Max A multimodal cues. SLP Short Term Goal 4 (Week 2): Pt will imitate at the phoneme level with 50% accuracy provided Max A multimodal cues. SLP Short Term Goal 5 (Week 2): Pt will express basic wants and needs via gestures or other multimodal means with Mod A cues. SLP Short Term Goal 6 (Week 2): Pt will sustain attention for functional tasks for 10 minute intervals with Mod A cues for redirection.  Skilled Therapeutic Interventions: Pt was seen for skilled ST targeting arousal and communication. Upon arrival pt was very lethargic, therefore multimodal interventions provided to increase arousal including repositioning, tactile, auditory, and attempts at visual stimulation (however pt did not open eyes). Pt minimally engaged in therapist, however provided Max A multimodal cues, pt utilized gestures to indicate she would not participate in any further therapeutic activities targeting dysphagia, cognition, or communication beyond yes/no head nods. Pt eventually ceased to acknowledge therapist, began snoring, and was not responsive to additional interventions to achieve arousal again. Pt missed 45 minutes of skilled ST as a result. Pt left laying in bed with alarm set and needs within reach. Continue  per current plan of care.          Pain Pain Assessment Pain Scale: Faces Faces Pain Scale: No hurt   Therapy/Group: Individual Therapy  Arbutus Leas 01/17/2020, 7:17 AM

## 2020-01-18 ENCOUNTER — Inpatient Hospital Stay (HOSPITAL_COMMUNITY): Payer: 59 | Admitting: Speech Pathology

## 2020-01-18 ENCOUNTER — Inpatient Hospital Stay (HOSPITAL_COMMUNITY): Payer: 59 | Admitting: Physical Therapy

## 2020-01-18 ENCOUNTER — Inpatient Hospital Stay (HOSPITAL_COMMUNITY): Payer: 59 | Admitting: Occupational Therapy

## 2020-01-18 LAB — GLUCOSE, CAPILLARY
Glucose-Capillary: 255 mg/dL — ABNORMAL HIGH (ref 70–99)
Glucose-Capillary: 162 mg/dL — ABNORMAL HIGH (ref 70–99)
Glucose-Capillary: 269 mg/dL — ABNORMAL HIGH (ref 70–99)

## 2020-01-18 MED ORDER — INSULIN GLARGINE 100 UNIT/ML ~~LOC~~ SOLN
70.0000 [IU] | Freq: Every day | SUBCUTANEOUS | Status: DC
  Administered 2020-01-18 – 2020-01-21 (×4): 70 [IU] via SUBCUTANEOUS
  Filled 2020-01-18 (×5): qty 0.7

## 2020-01-18 MED ORDER — BETHANECHOL CHLORIDE 25 MG PO TABS
25.0000 mg | ORAL_TABLET | Freq: Two times a day (BID) | ORAL | Status: DC
  Administered 2020-01-19 – 2020-01-24 (×12): 25 mg
  Filled 2020-01-18 (×12): qty 1

## 2020-01-18 NOTE — Progress Notes (Addendum)
Physical Therapy Session Note  Patient Details  Name: Robin [name] MRN: [id number] Date of Birth: 1965-10-05  Today's Date: 01/18/2020 PT Individual Time: 3220-2542 PT Individual Time Calculation (min): 59 min   Short Term Goals: Week 2:  PT Short Term Goal 1 (Week 2): Pt will perform supine<>sit with min assist PT Short Term Goal 2 (Week 2): Pt will perform bed<>chair transfers using LRAD with mod assist PT Short Term Goal 3 (Week 2): Pt will ambulate at least 47ft using LRAD with mod assist of 1 and +2 w/c follow if needed PT Short Term Goal 4 (Week 2): Pt will initiate stair navigation  Skilled Therapeutic Interventions/Progress Updates:   Pt received supine in bed and agreeable to therapy session. Donned TEDs, R ankle brace, and socks max assist for time management. Supine>sitting R EOB with mod assist for trunk upright. Sitting EOB with supervision for sitting balance while therapist donned pants max assist. Sit>stand EOB>UE support on bedrail with mod assist for lifting and for balance while pulling pants over hips with max assist. L squat pivot to w/c with mod assist for pivoting hips.  Transported to/from gym in w/c for time management and energy conservation. Focused on R LE WBing for NMR via proprioceptive input at L hallway rail for support with mirror feedback. Sit<>stands at L hallway rail with cuing to push up from w/c with min/mod assist for lifting and therapist facilitating R LE WBing and hip/knee extension. Standing without UE support performed L UE reaching task to grasp horseshoes from top of mirror to promote increased trunk/hip/knee extension with light mod assist for balance. Standing with L UE support on hallway rail performed L LE foot taps on 4" step targeting R LE WBing and trunk/hip/knee extension with max progressed to mod assist on 3rd set of 10reps with therapist blocking R knee buckle and hyperextension - multimodal cuing and manual facilitation for increased  trunk/hip/knee extension. (Addendum: During seated rest breaks performed R LE NMR via 2x4reps long arc quads to external target with pt demonstrating significant difficulty performing sustained muscle contraction and active movement through full ROM). Gait training at L hallway rail 47ft x1 with mod/max assist and +2 w/c follow - requires max assist to advance R LE and block knee buckle and hyperextension during stance. Pt noted to look like she didn't feel well at end of walk. Assessed vitals in sitting after a few minutes (gathering dynavision and BP cuff): BP 121/72 (MAP 88), HR 80bpm. Reassessed in standing BP 99/70 (MAP 81), HR 76bpm. Transported back to room and notified [name], RN of orthostatic hypotension. Stand pivot w/c<>toilet using grab bar with mod assist but requires max assist for R LE lateral stepping when transferring to the R. Standing with min assist using L UE support on grab bar while therapist performed total assist LB clothing management. Continent of baldder and performed seated peri-care with min assist to incorporate R UE into task. Seated hand hygiene at sink with min assist. Provided pt with water and encouraged increased intake and supplied thigh high TEDs to wear in future sessions. Pt left seated tilted back in TIS w/c with needs in reach and seat belt alarm on.   Therapy Documentation Precautions:  Precautions Precautions: Fall Precaution Comments: R hemiparesis, R inattention Restrictions Weight Bearing Restrictions: No  Pain:  Grimaces and demonstrates some pain when donning R LE TEDs and ankle brace but no significant pain noticed with standing/WBing tasks.  Therapy/Group: Individual Therapy  Robin [name], PT, DPT  01/18/2020, 4:32 PM

## 2020-01-18 NOTE — Plan of Care (Signed)
Problem: Consults Goal: RH STROKE PATIENT EDUCATION Description: See Patient Education module for education specifics  01/18/2020 1246 by Rodolph Bong, LPN Outcome: Progressing 01/18/2020 1245 by Rodolph Bong, LPN Outcome: Progressing Goal: Diabetes Guidelines if Diabetic/Glucose > 140 Description: If diabetic or lab glucose is > 140 mg/dl - Initiate Diabetes/Hyperglycemia Guidelines & Document Interventions  01/18/2020 1246 by Rodolph Bong, LPN Outcome: Progressing 01/18/2020 1245 by Rodolph Bong, LPN Outcome: Progressing   Problem: RH BOWEL ELIMINATION Goal: RH STG MANAGE BOWEL WITH ASSISTANCE Description: STG Manage Bowel with  mod  Assistance. 01/18/2020 1246 by Rodolph Bong, LPN Outcome: Progressing 01/18/2020 1245 by Rodolph Bong, LPN Outcome: Progressing Goal: RH STG MANAGE BOWEL W/MEDICATION W/ASSISTANCE Description: STG Manage Bowel with Medication with mod Assistance. 01/18/2020 1246 by Rodolph Bong, LPN Outcome: Progressing 01/18/2020 1245 by Rodolph Bong, LPN Outcome: Progressing   Problem: RH BLADDER ELIMINATION Goal: RH STG MANAGE BLADDER WITH ASSISTANCE Description: STG Manage Bladder With mod Assistance 01/18/2020 1246 by Rodolph Bong, LPN Outcome: Progressing 01/18/2020 1245 by Rodolph Bong, LPN Outcome: Progressing   Problem: RH SKIN INTEGRITY Goal: RH STG SKIN FREE OF INFECTION/BREAKDOWN Description: Patients skin will remain free from further infection or breakdown with mod assist. 01/18/2020 1246 by Rodolph Bong, LPN Outcome: Progressing 01/18/2020 1245 by Rodolph Bong, LPN Outcome: Progressing Goal: RH STG MAINTAIN SKIN INTEGRITY WITH ASSISTANCE Description: STG Maintain Skin Integrity With mod Assistance. 01/18/2020 1246 by Rodolph Bong, LPN Outcome: Progressing 01/18/2020 1245 by Rodolph Bong, LPN Outcome: Progressing Goal: RH STG ABLE TO PERFORM INCISION/WOUND CARE W/ASSISTANCE Description: STG Able To Perform Incision/Wound  Care With mod Assistance. 01/18/2020 1246 by Rodolph Bong, LPN Outcome: Progressing 01/18/2020 1245 by Rodolph Bong, LPN Outcome: Progressing   Problem: RH SAFETY Goal: RH STG ADHERE TO SAFETY PRECAUTIONS W/ASSISTANCE/DEVICE Description: STG Adhere to Safety Precautions With min Assistance/Device. 01/18/2020 1246 by Rodolph Bong, LPN Outcome: Progressing 01/18/2020 1245 by Rodolph Bong, LPN Outcome: Progressing   Problem: RH COGNITION-NURSING Goal: RH STG USES MEMORY AIDS/STRATEGIES W/ASSIST TO PROBLEM SOLVE Description: STG Uses Memory Aids/Strategies With min Assistance to Problem Solve. 01/18/2020 1246 by Rodolph Bong, LPN Outcome: Progressing 01/18/2020 1245 by Rodolph Bong, LPN Outcome: Progressing   Problem: RH PAIN MANAGEMENT Goal: RH STG PAIN MANAGED AT OR BELOW [name] PAIN GOAL Description: < 3 01/18/2020 1246 by Rodolph Bong, LPN Outcome: Progressing 01/18/2020 1245 by Rodolph Bong, LPN Outcome: Progressing   Problem: RH KNOWLEDGE DEFICIT Goal: RH STG INCREASE KNOWLEDGE OF DIABETES Description: Patient/caregiver will verbalize understanding of the management of DM including diet, exercise, medications, monitoring with min assist. 01/18/2020 1246 by Rodolph Bong, LPN Outcome: Progressing 01/18/2020 1245 by Rodolph Bong, LPN Outcome: Progressing Goal: RH STG INCREASE KNOWLEDGE OF HYPERTENSION Description: Patient/caregiver will verbalize understanding of the management of HTN including diet, exercise, medications, monitoring with min assist. 01/18/2020 1246 by Rodolph Bong, LPN Outcome: Progressing 01/18/2020 1245 by Rodolph Bong, LPN Outcome: Progressing Goal: RH STG INCREASE KNOWLEDGE OF DYSPHAGIA/FLUID INTAKE Description: Patient/caregiver will verbalize understanding of the management of dysphagia including diet, medications, and swallowing strategies as outlined by SLP with min assist. 01/18/2020 1246 by Rodolph Bong, LPN Outcome:  Progressing 01/18/2020 1245 by Rodolph Bong, LPN Outcome: Progressing Goal: RH STG INCREASE KNOWLEGDE OF HYPERLIPIDEMIA Description: Patient/caregiver will verbalize understanding of the management of HLD including diet, exercise, medications, monitoring with min assist. 01/18/2020 1246 by Serena Croissant  A, LPN Outcome: Progressing 01/18/2020 1245 by Rodolph Bong, LPN Outcome: Progressing Goal: RH STG INCREASE KNOWLEDGE OF STROKE PROPHYLAXIS Description: Patient/caregiver will verbalize understanding of the management of stroke prophylaxis including diet, exercise, medications, monitoring with min assist. 01/18/2020 1246 by Rodolph Bong, LPN Outcome: Progressing 01/18/2020 1245 by Rodolph Bong, LPN Outcome: Progressing

## 2020-01-18 NOTE — Progress Notes (Addendum)
[place] PHYSICAL MEDICINE & REHABILITATION PROGRESS NOTE   Subjective/Complaints:   No issues overnite , eating well this am , 75-100% yesterday despite TF   ROS: limited due to language/communication   Objective:   No results found. No results for input(s): WBC, HGB, HCT, PLT in the last 72 hours. No results for input(s): NA, K, CL, CO2, GLUCOSE, BUN, CREATININE, CALCIUM in the last 72 hours.  Intake/Output Summary (Last 24 hours) at 01/18/2020 0738 Last data filed at 01/17/2020 2300 Gross per 24 hour  Intake 868 ml  Output --  Net 868 ml     Physical Exam: Vital Signs Blood pressure 115/72, pulse 84, temperature 97.8 F (36.6 C), resp. rate 18, weight 97.1 kg, last menstrual period 08/21/2011, SpO2 98 %.  General: No acute distress Mood and affect are appropriate Heart: Regular rate and rhythm no rubs murmurs or extra sounds Lungs: Clear to auscultation, breathing unlabored, no rales or wheezes Abdomen: Positive bowel sounds, soft nontender to palpation, nondistended Extremities: No clubbing, cyanosis, or edema Skin: No evidence of breakdown, no evidence of rash  Neurologic: awake, aphasic, language limited, inconsistent. Cranial nerves II through XII intact, motor strength is grossly 4- 5/5 in left . 2-/5 RIght  deltoid, bicep, tricep, grip, 5/5 left  0/5 right hip flexor, knee extensors, ankle dorsiflexor and plantar flexor,2- right hip ext Sensory withdraws to pain L>R     Musculoskeletal:wearing ASO   Assessment/Plan: 1. Functional deficits secondary to Cardioembolic Left MCA infarct  which require 3+ hours per day of interdisciplinary therapy in a comprehensive inpatient rehab setting.  Physiatrist is providing close team supervision and 24 hour management of active medical problems listed below.  Physiatrist and rehab team continue to assess barriers to discharge/monitor patient progress toward functional and medical goals  Care Tool:  Bathing    Body  parts bathed by patient: Right arm, Chest, Abdomen, Front perineal area, Right upper leg, Left upper leg, Face   Body parts bathed by helper: Left arm, Buttocks, Left lower leg, Right lower leg     Bathing assist Assist Level: Moderate Assistance - Patient 50 - 74%     Upper Body Dressing/Undressing Upper body dressing   What is the patient wearing?: Pull over shirt    Upper body assist Assist Level: Moderate Assistance - Patient 50 - 74%    Lower Body Dressing/Undressing Lower body dressing      What is the patient wearing?: Incontinence brief, Pants     Lower body assist Assist for lower body dressing: Moderate Assistance - Patient 50 - 74%     Toileting Toileting    Toileting assist Assist for toileting: Maximal Assistance - Patient 25 - 49%     Transfers Chair/bed transfer  Transfers assist  Chair/bed transfer activity did not occur: Safety/medical concerns  Chair/bed transfer assist level: 2 Helpers     Locomotion Ambulation   Ambulation assist   Ambulation activity did not occur: Safety/medical concerns  Assist level: Maximal Assistance - Patient 25 - 49% Assistive device: Orthosis Max distance: 20   Walk 10 feet activity   Assist  Walk 10 feet activity did not occur: Safety/medical concerns  Assist level: Maximal Assistance - Patient 25 - 49% Assistive device: Orthosis   Walk 50 feet activity   Assist Walk 50 feet with 2 turns activity did not occur: Safety/medical concerns         Walk 150 feet activity   Assist Walk 150 feet activity did not occur: Safety/medical  concerns         Walk 10 feet on uneven surface  activity   Assist Walk 10 feet on uneven surfaces activity did not occur: Safety/medical concerns         Wheelchair     Assist Will patient use wheelchair at discharge?:  (TBD)             Wheelchair 50 feet with 2 turns activity    Assist            Wheelchair 150 feet activity      Assist          Blood pressure 115/72, pulse 84, temperature 97.8 F (36.6 C), resp. rate 18, weight 97.1 kg, last menstrual period 08/21/2011, SpO2 98 %.  Medical Problem List and Plan: 1.  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  Antithrombotics: -DVT/anticoagulation: Eliquis             -antiplatelet therapy: N/A 3. Pain Management: Neurontin 100 mg every 8 hours, Zanaflex 2 mg twice daily as needed  Appears controlled on 6/13             Monitor with increased exertion 4. Mood: Provide emotional support- cannot assess accurately related to severe aphasia, empirically started on citalopram for "flat affect"  Which may be MCA infarct effect              -antipsychotic agents: N/A 5. Neuropsych: This patient is not capable of making decisions on her own behalf. 6. Skin/Wound Care: Routine skin checks 7. Fluids/Electrolytes/Nutrition: Routine in and outs.  Oral intake improving 75-100% will cont megace , d/c TF 8.  Post stroke dysphagia.    Dysphagia #1 pudding thick liquids.  Follow-up speech therapy.  Advance diet as tolerated 9.  Hypertension.  Coreg 50 mg twice daily,Bidil 20-37.5 mg 2 tabs 3 times daily            Vitals:   01/17/20 2000 01/18/20 0508  BP: (!) 83/61 115/72  Pulse: 77 84  Resp: 17 18  Temp: 98.3 F (36.8 C) 97.8 F (36.6 C)  SpO2: 98% 98%   Controlled on 6/15 10.  Atrial fibrillation.  Lanoxin 0.125 mg daily.  Cardiac rate controlled 11.  Chronic diastolic congestive heart failure.  Demadex 20 mg daily.  Monitor for any signs of fluid overload Filed Weights   01/16/20 0509 01/17/20 0500 01/18/20 0500  Weight: 95.9 kg 96 kg 97.1 kg   No clinical signs of increasing edema 12.  Diabetes mellitus with peripheral neuropathy.  Hemoglobin A1c 11.6.  NovoLog 2 units every 4 hours,  Lantus insulin 60 units nightly, Glucophage 500 mg twice daily CBG (last 3)  Recent Labs    01/17/20 1145 01/17/20 1624 01/17/20 2103  GLUCAP 190* 133* 243*    Increase to 70U, intake improving              Monitor with increased mobility 13.  COPD with tobacco abuse.  Continue nebulizer treatments 14.  Polysubstance abuse.  Urine drug screen positive cocaine.    Counsel when appropriate 15.  Hyperlipidemia.  Crestor 16.  GERD.  Protonix 17.  AKI Creat ok mainly prerenal azotemia with elevated BUN - no need to hold metformin at this time  18. RIght  ATFL sprain, order ankle splint, may use Ice , Remove at noc  19.  Urinary retention may have diabetic cystopathy  -appears improved with urecholine--continue,    LOS: 13 days A FACE TO FACE EVALUATION WAS PERFORMED  Charlett Blake 01/18/2020, 7:38 AM

## 2020-01-18 NOTE — Progress Notes (Signed)
Occupational Therapy Session Note  Patient Details  Name: Robin [name] MRN: [id number] Date of Birth: 1966/07/11  Today's Date: 01/18/2020 OT Individual Time: 3300-7622 OT Individual Time Calculation (min): 72 min    Short Term Goals: Week 2:  OT Short Term Goal 1 (Week 2): Pt will complete UB bathing with no more than mod instructional cueing and min assist. OT Short Term Goal 2 (Week 2): Pt will donn a pullover shirt with no more than min assist and min demonstrational cueing following hemi dressing techniques. OT Short Term Goal 3 (Week 2): Pt will complete LB bathing with mod assist sit to stand for 2 consecutive sessions. OT Short Term Goal 4 (Week 2): Pt will use the RUE as a gross assist with selfcare tasks and mod facilitation.  Skilled Therapeutic Interventions/Progress Updates:    Pt worked on shower and dressing during session.  She exhibited bladder incontinence episode with sit to stand from the EOB in the [place].  She was taken to the toilet, but was unable to void once sitting on it.  She was then transferred to the shower bench with total assist in the [place] as well.  She needed min assist for completion of UB bathing with mod assist sit to stand for LB bathing.  Max hand over hand assistance was needed for washing the LUE using the hemiparetic RUE.  Mod instructional cueing was needed for thoroughness as well with washing and rinsing.  She transferred out to the sink in the wheelchair for dressing tasks with use of the [place] as well.  Mod assist with max demonstrational cueing for following hemi dressing techniques for both the UB and LB.  Pt with decreased ability to follow demonstration for donning pullover shirt.  She needed mod assist for crossing and maintaining the RLE over the left knee for donning brief, but was unable to tolerate crossing it for donning pants secondary to increased right hip pain.  Mod assist was needed for sit to stand and standing to pull the items up  over her hips.  Therapist provided total assist for donning TEDS and right ankle splint.  She was able to donn the left gripper sock with mod assist but needed max assist on the right.  Therapist also provided total assist for donning the right wrist cock-up splint as well with pt gesturing that her right dorsal hand was hurting.  She would grimace with PROM at the right wrist as well.  Finished session with transfer back to the bed stand pivot with max assist to the right side.  She needed mod to max facilitation for right knee stabilization with stepping as well as for advancing the RLE.  She was left with safety alarm in place and call button and phone in reach.    Therapy Documentation Precautions:  Precautions Precautions: Fall Precaution Comments: R hemiparesis, R inattention Restrictions Weight Bearing Restrictions: No  Pain: Pain Assessment Pain Scale: Faces Faces Pain Scale: Hurts a little bit Pain Type: Acute pain Pain Location: Hand Pain Orientation: Right Pain Descriptors / Indicators: Discomfort Pain Onset: With Activity Pain Intervention(s): Repositioned;Emotional support;Splinting ADL:   Therapy/Group: Individual Therapy  Zahrah Sutherlin OTR/L 01/18/2020, 12:22 PM

## 2020-01-18 NOTE — Progress Notes (Signed)
Speech Language Pathology Daily Session Note  Patient Details  Name: Robin [name] MRN: [id number] Date of Birth: 28-May-1966  Today's Date: 01/18/2020 SLP Individual Time: 1302-1400 SLP Individual Time Calculation (min): 58 min  Short Term Goals: Week 2: SLP Short Term Goal 1 (Week 2): Pt will consume current diet with minimal overt s/sx aspiration and efficient mastication and oral clearance with Min A for use of safe swallow strategies. SLP Short Term Goal 2 (Week 2): Pt will consume trials of advanced solids, demonstrating efficient mastication and oral clearance with no more than Min A cues X3 prior to advancement. SLP Short Term Goal 3 (Week 2): Pt will name common objects with 25% accuracy provided Max A multimodal cues. SLP Short Term Goal 4 (Week 2): Pt will imitate at the phoneme level with 50% accuracy provided Max A multimodal cues. SLP Short Term Goal 5 (Week 2): Pt will express basic wants and needs via gestures or other multimodal means with Mod A cues. SLP Short Term Goal 6 (Week 2): Pt will sustain attention for functional tasks for 10 minute intervals with Mod A cues for redirection.  Skilled Therapeutic Interventions: Skilled ST services focused on language skills. Pt was verbalizing and gesturing unsuccesfully when SLP entered room, eventualy pt was able to point to bed sheet indicating it was wet and confirmed via response to yes/no questions she would like her brief changed. SLP and NT assisted with changing brief and pad, pt following 1 step commands. Pt was able to match word to object in a field of 2 in 7 out 10 opportunities. Pt demonstrated ability to name 1 out 5 objects and 3 out 5 objects with phonemic errors. Pt demonstrated ability to match photograph to objects in a field of 2 in 3 out 3 opportunities. Pt was able to identify requested photo of functional items in a field of 4 in 3 out 4 trials increasing to 4 out 4 trials. Pt demonstrated ability to write first and  last name with initial letter for first name provided and pt was able to copy city "[place]" and self correct written preservations. Pt was left in room with call bell within reach and bed alarm set. ST recommends to continue skilled ST services.      Pain Pain Assessment Pain Score: 0-No pain  Therapy/Group: Individual Therapy  [name]  [facility] 01/18/2020, 5:10 PM

## 2020-01-19 ENCOUNTER — Inpatient Hospital Stay (HOSPITAL_COMMUNITY): Payer: 59 | Admitting: Physical Therapy

## 2020-01-19 ENCOUNTER — Inpatient Hospital Stay (HOSPITAL_COMMUNITY): Payer: 59 | Admitting: Occupational Therapy

## 2020-01-19 ENCOUNTER — Inpatient Hospital Stay (HOSPITAL_COMMUNITY): Payer: 59 | Admitting: Speech Pathology

## 2020-01-19 LAB — GLUCOSE, CAPILLARY
Glucose-Capillary: 186 mg/dL — ABNORMAL HIGH (ref 70–99)
Glucose-Capillary: 231 mg/dL — ABNORMAL HIGH (ref 70–99)
Glucose-Capillary: 246 mg/dL — ABNORMAL HIGH (ref 70–99)
Glucose-Capillary: 263 mg/dL — ABNORMAL HIGH (ref 70–99)

## 2020-01-19 MED ORDER — CARVEDILOL 25 MG PO TABS
25.0000 mg | ORAL_TABLET | Freq: Two times a day (BID) | ORAL | Status: DC
  Administered 2020-01-19 – 2020-02-02 (×28): 25 mg
  Filled 2020-01-19 (×28): qty 1

## 2020-01-19 NOTE — Progress Notes (Signed)
Physical Therapy Session Note  Patient Details  Name: Robin [name] MRN: [id number] Date of Birth: January 25, 1966  Today's Date: 01/19/2020 PT Individual Time: 1705-1730 PT Individual Time Calculation (min): 25 min   Short Term Goals: Week 2:  PT Short Term Goal 1 (Week 2): Pt will perform supine<>sit with min assist PT Short Term Goal 2 (Week 2): Pt will perform bed<>chair transfers using LRAD with mod assist PT Short Term Goal 3 (Week 2): Pt will ambulate at least 40f using LRAD with mod assist of 1 and +2 w/c follow if needed PT Short Term Goal 4 (Week 2): Pt will initiate stair navigation  Skilled Therapeutic Interventions/Progress Updates:  Pt received supine in bed and agreeable to PT. Supine>sit transfer with mod assist for the RLE and full upright posture. squat [facility] and LLE blocked. Kinetron BLE NMR 3 x 1 min with cues for full ROM on the R. Pt returned to room and performed squat pivot transfer to bed with as listed above. Sit>supine completed with min assist at the RLE. PT removed R ankle brace, and left supine in bed with call bell in reach and all needs met.         Therapy Documentation Precautions:  Precautions Precautions: Fall Precaution Comments: R hemiparesis, R inattention Restrictions Weight Bearing Restrictions: No    Vital Signs: Therapy Vitals Temp: 97.8 F (36.6 C) Pulse Rate: 90 Resp: 18 BP: 128/75 Patient Position (if appropriate): Lying Oxygen Therapy SpO2: 97 % O2 Device: Room Air Pain: Pain Assessment Pain Scale: 0-10 Pain Score: 0-No pain       Therapy/Group: Individual Therapy  ALorie Phenix6/16/2021, 8:08 PM
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
## 2020-01-19 NOTE — Progress Notes (Signed)
Physical Therapy Session Note  Patient Details  Name: Robin [name] MRN: [id number] Date of Birth: July 22, 1966  Today's Date: 01/19/2020 PT Individual Time: 4765-4650 PT Individual Time Calculation (min): 56 min   Short Term Goals: Week 2:  PT Short Term Goal 1 (Week 2): Pt will perform supine<>sit with min assist PT Short Term Goal 2 (Week 2): Pt will perform bed<>chair transfers using LRAD with mod assist PT Short Term Goal 3 (Week 2): Pt will ambulate at least 74ft using LRAD with mod assist of 1 and +2 w/c follow if needed PT Short Term Goal 4 (Week 2): Pt will initiate stair navigation  Skilled Therapeutic Interventions/Progress Updates:    Pt received supine in bed and agreeable to therapy session. Supine>sitting R EOB with mod assist primarily for trunk upright. Sitting EOB using L UE support on footboard with intermittent min assist for trunk support while performing max assist LB clothing management donning thigh high TEDs, R ankle brace, and socks. Sit>stand with mod assist and +2 min assist while therapist pulled up pants with max assist. L squat pivot transfer to w/c with mod assist for lifting/pivoting hips.  Transported to/from gym in w/c for time management and energy conservation. L squat pivot w/c>EOM with mod assist for lifting/pivoting hips. Sit>supine with min/mod assist for R LE management onto mat and R UE positioning. Supine R LE NMR via bridging 2x12reps with cuing for increased glute activation and hip extension - mod manual facilitation for R LE positioning to avoid it falling out to the side. Supine R LE NMR via heel slides for hip/knee flexion muscle activation x10reps. Supine>sitting R EOB with mod assist for trunk upright. LE strengthening and R LE NMR via [place] during transition from sitting>squat position while reaching forward to place clothespins on line with R attention grasping from R side - heavy mod assist for lifting hips and blocking R knee to promote WBing.  During task pt started looking like the did not feel well - assessed vitals in sitting: BP 111/65 (MAP 78), HR 85bpm continued task but pt continued to look like she didn't feel well so reassessed vitals: BP 132/85 (MAP 94), HR 75bpm. Transitioned to standing R LE NMR via WBing while stepping L LE on/off 4" step with mod/max assist of 1 and +2 min assist via HHA - therapist blocking R knee buckle and facilitating trunk/hip/knee extension with mirror feedback. Pt again looked like she didn't feel well and upon questioning confirmed she is "dizzy" or "lightheaded" reassessed vitals after standing: BP 107/73 (MAP 85), HR 85bpm, SpO2 100%. R squat pivot to w/c with mod assist for lifting/pivoting hips and provided seated rest break tilted back in w/c for BP management. Therapist donned B LE ACE wraps to her knees over the thigh high TED hose. Transported back to room and left seated tilted back in w/c with needs in reach, seat belt alarm on, water provided and pt appearing to feel better. MD and RN notified of pt's symptoms.  Therapy Documentation Precautions:  Precautions Precautions: Fall Precaution Comments: R hemiparesis, R inattention Restrictions Weight Bearing Restrictions: No  Pain:   Continues to display some discomfort in R foot/ankle when donning TED hose - adjusted technique for pain management.  Therapy/Group: Individual Therapy  Tawana Scale, PT, DPT 01/19/2020, 7:51 AM

## 2020-01-19 NOTE — Progress Notes (Signed)
Patient ID: Robin [name], female   DOB: Jun 06, 1966, 54 y.o.   MRN: [id number]  Team Conference Report to Patient/Family  Team Conference discussion was reviewed with the patient and caregiver, including goals, any changes in plan of care and target discharge date.  Patient and caregiver express understanding and are in agreement.  The patient has a target discharge date of 02/02/20.  Dyanne Iha 01/19/2020, 2:16 PM

## 2020-01-19 NOTE — Progress Notes (Signed)
Speech Language Pathology Daily Session Note  Patient Details  Name: Robin [name] MRN: [id number] Date of Birth: Jun 25, 1966  Today's Date: 01/19/2020 SLP Individual Time: 8832-5498 SLP Individual Time Calculation (min): 42 min  Short Term Goals: Week 2: SLP Short Term Goal 1 (Week 2): Pt will consume current diet with minimal overt s/sx aspiration and efficient mastication and oral clearance with Min A for use of safe swallow strategies. SLP Short Term Goal 2 (Week 2): Pt will consume trials of advanced solids, demonstrating efficient mastication and oral clearance with no more than Min A cues X3 prior to advancement. SLP Short Term Goal 3 (Week 2): Pt will name common objects with 25% accuracy provided Max A multimodal cues. SLP Short Term Goal 4 (Week 2): Pt will imitate at the phoneme level with 50% accuracy provided Max A multimodal cues. SLP Short Term Goal 5 (Week 2): Pt will express basic wants and needs via gestures or other multimodal means with Mod A cues. SLP Short Term Goal 6 (Week 2): Pt will sustain attention for functional tasks for 10 minute intervals with Mod A cues for redirection.  Skilled Therapeutic Interventions: Pt was seen for skilled ST targeting dysphagia and functional communication goals. Pt accepted upgraded trial of Dys 3 solids (mech soft) and thin liquids with no overt s/sx aspiration and fully efficient mastication and oral clearance. Would recommend 1 additional larger quantity trial prior to upgrade, continue current diet for now. Pt used gestures to communicate need to void as well as desire to watch TV and reposition R leg in bed with only Min A cues from clinician for clarification. She followed all basic directions with Min A cues verbal and visual cues for sequencing and sustained attention. She also demonstrated ability to associate pictures with basic needs via a picture communication board with fluctuating 70-80% accuracy (ex: which picture would you  point to if you needed to see your nurse, etc.). Picture communication board left at bedside and spoke with RN regarding its use to clarify messages when pt's needs are not being successfully expressed via gestures. Pt left sitting in bed with alarm set and needs within reach. Continue per current plan of care.        Pain Pain Assessment Pain Scale: 0-10 Pain Score: 0-No pain  Therapy/Group: Individual Therapy  Arbutus Leas 01/19/2020, 6:56 AM

## 2020-01-19 NOTE — Progress Notes (Signed)
Occupational Therapy Weekly Progress Note  Patient Details  Name: Robin [name] MRN: [id number] Date of Birth: [birth year]  Beginning of progress report period: January 13, 2020 End of progress report period: January 19, 2020  Today's Date: 01/19/2020 OT Individual Time: 1105-1200 OT Individual Time Calculation (min): 55 min    Patient has met 2 of 4 short term goals.  [name] is making great progress with OT at this time.  She is able to complete all bathing sit to stand with mod assist and mod instructional cueing for sequencing.  UB dressing is still challenging at mod assist as well as LB dressing when donning brief and pants.  She needs max assist for donning her gripper socks secondary to not being able to understand and return demonstrate one handed method.  RUE function continues to improve and she is currently at a Brunnstrum stage III level in the right arm with stage V in the hand.  Gross digit flexion and extension AROM are around 70%, however she still demonstrates what appears to be sensory deficits, as she will drop items unless max hand over hand assist is gvien.  She continues to receptive and expressive deficits with ability to follow one step commands related to selfcare tasks at 75%.  Endurance continues to improve as well with pt demonstrating greater sustained attention in therapy tasks and greater active participation.  Stand pivot transfers are approaching mod assist, however she does not demonstrate enough consistency with this during transfers to the [facility] or tub bench.  Squat pivot transfers are at mod assist level overall.  Ms. Bishara continues to demonstrate right inattention, but she will turn her head to cueing or when asked to locate grooming and bathing items right of midline.  Feel overall she is progressing well and will continue to benefit from CIR level therapy with anticipated discharge of 6/30.  Will continue with current OT POC with min assist LTGs established.    Patient continues to demonstrate the following deficits: muscle weakness, decreased cardiorespiratoy endurance, impaired timing and sequencing, unbalanced muscle activation and decreased coordination, decreased attention to right, decreased attention, decreased awareness, decreased problem solving, decreased safety awareness, decreased memory and delayed processing and decreased sitting balance, decreased standing balance, decreased postural control, hemiplegia and decreased balance strategies and therefore will continue to benefit from skilled OT intervention to enhance overall performance with BADL and Reduce care partner burden.  Patient progressing toward long term goals..  Continue plan of care.  OT Short Term Goals Week 3:  OT Short Term Goal 1 (Week 3): Pt will donn a pullover shirt with no more than min assist and min demonstrational cueing following hemi dressing techniques. OT Short Term Goal 2 (Week 3): Pt will use the RUE as a gross assist with selfcare tasks and mod facilitation. OT Short Term Goal 3 (Week 3): Pt will complete toilet transfers with mod assist stand pivot for 3 consecutive sessions. OT Short Term Goal 4 (Week 3): Pt will complete LB dressing sit to stand with min assist excluding TEDs.  Skilled Therapeutic Interventions/Progress Updates:    Pt eager for therapy during session.  She was taken down to the therapy gym via wheelchair with mod assist stand pivot transfer to the right to the therapy mat.  She worked on sit to stand and standing balance from the mat while incorporating reaching task using clothespins.  She was able to complete sit to stand with min facilitation, but then needed mod assist to maintain standing  balance while reaching down to pick up clothespins with the LUE and place on horizontal bars in front of her.  Mod assist needed to maintain right knee extension during transition squat to stand.  She did exhibit some fatigue with standing intervals and  would close her eyes when resting.  She was unable to acknowledge whether or not she was dizzy when asked however.  BP taken in sitting at 131/95 initially and then 105/67 and 123/77 after standing intervals.  She was able to also integrate the RUE for functional reach to pick up clothespins from the rolling stool and place in container.  She demonstrated improved digit flexion and extension this session with mod assist needed to pick up and maintain grasp on clothespin to place in container.  She was not able to demonstrate pinch for opening clothespin however at this time.  Returned to the wheelchair at end of session with mod assist stand pivot.  She was taken back to the room where she remained up in the tilt in space wheelchair in preparation for lunch.  Call button and phone in reach with safety alarm belt in place.    Therapy Documentation Precautions:  Precautions Precautions: Fall Precaution Comments: R hemiparesis, R inattention Restrictions Weight Bearing Restrictions: No  Pain: Pain Assessment Pain Scale: Faces Pain Score: 0-No pain ADL: See Care Tool Section for some details of mobility and selfcare tasks  Therapy/Group: Individual Therapy  Bryant Lipps OTR/L 01/19/2020, 12:06 PM

## 2020-01-19 NOTE — Progress Notes (Signed)
[place] PHYSICAL MEDICINE & REHABILITATION PROGRESS NOTE   Subjective/Complaints:  40-50% meals yesterday but daughter brought in food in addition  , fluid intake>833m per day   ROS: limited due to language/communication   Objective:   No results found. No results for input(s): WBC, HGB, HCT, PLT in the last 72 hours. No results for input(s): NA, K, CL, CO2, GLUCOSE, BUN, CREATININE, CALCIUM in the last 72 hours.  Intake/Output Summary (Last 24 hours) at 01/19/2020 0854 Last data filed at 01/19/2020 0841 Gross per 24 hour  Intake 1116 ml  Output 600 ml  Net 516 ml     Physical Exam: Vital Signs Blood pressure 122/72, pulse 82, temperature 98 F (36.7 C), resp. rate 14, weight 96.6 kg, last menstrual period 08/21/2011, SpO2 99 %.   General: No acute distress Mood and affect are appropriate Heart: Regular rate and rhythm no rubs murmurs or extra sounds Lungs: Clear to auscultation, breathing unlabored, no rales or wheezes Abdomen: Positive bowel sounds, soft nontender to palpation, nondistended Extremities: No clubbing, cyanosis, or edema Skin: No evidence of breakdown, no evidence of rash  Neurologic: awake, aphasic, language limited, inconsistent. Cranial nerves II through XII intact, motor strength is grossly 4- 5/5 in left . 2-/5 RIght  deltoid, bicep, tricep, grip, 5/5 left  0/5 right hip flexor, knee extensors, ankle dorsiflexor and plantar flexor,2- right hip ext Sensory withdraws to pain L>R     Musculoskeletal:wearing ASO   Assessment/Plan: 1. Functional deficits secondary to Cardioembolic Left MCA infarct  which require 3+ hours per day of interdisciplinary therapy in a comprehensive inpatient rehab setting.  Physiatrist is providing close team supervision and 24 hour management of active medical problems listed below.  Physiatrist and rehab team continue to assess barriers to discharge/monitor patient progress toward functional and medical goals  Care  Tool:  Bathing    Body parts bathed by patient: Right arm, Chest, Abdomen, Front perineal area, Right upper leg, Left upper leg, Face, Left lower leg   Body parts bathed by helper: Right lower leg, Left arm, Buttocks     Bathing assist Assist Level: Maximal Assistance - Patient 24 - 49%     Upper Body Dressing/Undressing Upper body dressing   What is the patient wearing?: Pull over shirt    Upper body assist Assist Level: Moderate Assistance - Patient 50 - 74%    Lower Body Dressing/Undressing Lower body dressing      What is the patient wearing?: Incontinence brief, Pants     Lower body assist Assist for lower body dressing: Moderate Assistance - Patient 50 - 74%     Toileting Toileting    Toileting assist Assist for toileting: Maximal Assistance - Patient 25 - 49%     Transfers Chair/bed transfer  Transfers assist  Chair/bed transfer activity did not occur: Safety/medical concerns  Chair/bed transfer assist level: Moderate Assistance - Patient 50 - 74% (squat pivot)     Locomotion Ambulation   Ambulation assist   Ambulation activity did not occur: Safety/medical concerns  Assist level: 2 helpers (max A of 1 and +2 w/c follow) Assistive device: Other (comment) (L hallway rail) Max distance: 360f  Walk 10 feet activity   Assist  Walk 10 feet activity did not occur: Safety/medical concerns  Assist level: 2 helpers (max A of 1 and +2 w/c follow) Assistive device: Other (comment) (L hallway rail)   Walk 50 feet activity   Assist Walk 50 feet with 2 turns activity did not occur:  Safety/medical concerns         Walk 150 feet activity   Assist Walk 150 feet activity did not occur: Safety/medical concerns         Walk 10 feet on uneven surface  activity   Assist Walk 10 feet on uneven surfaces activity did not occur: Safety/medical concerns         Wheelchair     Assist Will patient use wheelchair at discharge?:  (TBD)              Wheelchair 50 feet with 2 turns activity    Assist            Wheelchair 150 feet activity     Assist          Blood pressure 122/72, pulse 82, temperature 98 F (36.7 C), resp. rate 14, weight 96.6 kg, last menstrual period 08/21/2011, SpO2 99 %.  Medical Problem List and Plan: 1.  Right-sided hemiparesis and aphasia secondary to left MCA infarction/petechial hemorrhage with saccular aneurysm at the paraclinoid right ICA status post mechanical thrombectomy  Continue CIR , Team conference today please see physician documentation under team conference tab, met with team  to discuss problems,progress, and goals. Formulized individual treatment plan based on medical history, underlying problem and comorbidities.    WHO/PRAFO qhs  2.  Antithrombotics: -DVT/anticoagulation: Eliquis             -antiplatelet therapy: N/A 3. Pain Management: Neurontin 100 mg every 8 hours, Zanaflex 2 mg twice daily as needed  Appears controlled on 6/13             Monitor with increased exertion 4. Mood: Provide emotional support- cannot assess accurately related to severe aphasia, empirically started on citalopram for "flat affect"  Which may be MCA infarct effect              -antipsychotic agents: N/A 5. Neuropsych: This patient is not capable of making decisions on her own behalf. 6. Skin/Wound Care: Routine skin checks 7. Fluids/Electrolytes/Nutrition: Routine in and outs.  Oral intake improving 75-100% will cont megace , d/c TF 8.  Post stroke dysphagia.    Dysphagia #1 pudding thick liquids.  Follow-up speech therapy.  Advance diet as tolerated 9.  Hypertension.  Coreg 50 mg twice daily,Bidil 20-37.5 mg 2 tabs 3 times daily            Vitals:   01/18/20 1959 01/19/20 0359  BP: 120/74 122/72  Pulse: 91 82  Resp: 18 14  Temp: 98.1 F (36.7 C) 98 F (36.7 C)  SpO2: 99% 99%   Controlled on 6/16 10.  Atrial fibrillation.  Lanoxin 0.125 mg daily.  Cardiac rate  controlled 11.  Chronic diastolic congestive heart failure.  Demadex 20 mg daily.  Monitor for any signs of fluid overload Filed Weights   01/17/20 0500 01/18/20 0500 01/19/20 0453  Weight: 96 kg 97.1 kg 96.6 kg   No clinical signs of increasing edema 12.  Diabetes mellitus with peripheral neuropathy.  Hemoglobin A1c 11.6.  NovoLog 2 units every 4 hours, Lantus insulin 60 units nightly, Glucophage 500 mg twice daily CBG (last 3)  Recent Labs    01/18/20 1752 01/18/20 2044 01/19/20 0616  GLUCAP 162* 269* 186*    Increase to 70U, intake improving , now off TF , additional dosage adjustment may be needed              Monitor with increased mobility 13.  COPD with tobacco abuse.  Continue nebulizer treatments 14.  Polysubstance abuse.  Urine drug screen positive cocaine.    Counsel when appropriate 15.  Hyperlipidemia.  Crestor 16.  GERD.  Protonix 17.  AKI Creat ok mainly prerenal azotemia with elevated BUN - no need to hold metformin at this time  18. RIght  ATFL sprain, order ankle splint, may use Ice , Remove at noc  19.  Urinary retention may have diabetic cystopathy  -appears improved with urecholine--continue,    LOS: 14 days A FACE TO [place] [facility] [place] Leske 01/19/2020, 8:54 AM

## 2020-01-19 NOTE — Plan of Care (Signed)
  Problem: Consults Goal: RH STROKE PATIENT EDUCATION Description: See Patient Education module for education specifics  Outcome: Progressing Goal: Diabetes Guidelines if Diabetic/Glucose > 140 Description: If diabetic or lab glucose is > 140 mg/dl - Initiate Diabetes/Hyperglycemia Guidelines & Document Interventions  Outcome: Progressing   Problem: RH BOWEL ELIMINATION Goal: RH STG MANAGE BOWEL WITH ASSISTANCE Description: STG Manage Bowel with  mod  Assistance. Outcome: Progressing Goal: RH STG MANAGE BOWEL W/MEDICATION W/ASSISTANCE Description: STG Manage Bowel with Medication with mod Assistance. Outcome: Progressing   Problem: RH BLADDER ELIMINATION Goal: RH STG MANAGE BLADDER WITH ASSISTANCE Description: STG Manage Bladder With mod Assistance Outcome: Progressing   Problem: RH SKIN INTEGRITY Goal: RH STG SKIN FREE OF INFECTION/BREAKDOWN Description: Patients skin will remain free from further infection or breakdown with mod assist. Outcome: Progressing Goal: RH STG MAINTAIN SKIN INTEGRITY WITH ASSISTANCE Description: STG Maintain Skin Integrity With mod Assistance. Outcome: Progressing Goal: RH STG ABLE TO PERFORM INCISION/WOUND CARE W/ASSISTANCE Description: STG Able To Perform Incision/Wound Care With mod Assistance. Outcome: Progressing   Problem: RH SAFETY Goal: RH STG ADHERE TO SAFETY PRECAUTIONS W/ASSISTANCE/DEVICE Description: STG Adhere to Safety Precautions With min Assistance/Device. Outcome: Progressing   Problem: RH COGNITION-NURSING Goal: RH STG USES MEMORY AIDS/STRATEGIES W/ASSIST TO PROBLEM SOLVE Description: STG Uses Memory Aids/Strategies With min Assistance to Problem Solve. Outcome: Progressing   Problem: RH PAIN MANAGEMENT Goal: RH STG PAIN MANAGED AT OR BELOW [name] PAIN GOAL Description: < 3 Outcome: Progressing   Problem: RH KNOWLEDGE DEFICIT Goal: RH STG INCREASE KNOWLEDGE OF DIABETES Description: Patient/caregiver will verbalize  understanding of the management of DM including diet, exercise, medications, monitoring with min assist. Outcome: Progressing Goal: RH STG INCREASE KNOWLEDGE OF HYPERTENSION Description: Patient/caregiver will verbalize understanding of the management of HTN including diet, exercise, medications, monitoring with min assist. Outcome: Progressing Goal: RH STG INCREASE KNOWLEDGE OF DYSPHAGIA/FLUID INTAKE Description: Patient/caregiver will verbalize understanding of the management of dysphagia including diet, medications, and swallowing strategies as outlined by SLP with min assist. Outcome: Progressing Goal: RH STG INCREASE KNOWLEGDE OF HYPERLIPIDEMIA Description: Patient/caregiver will verbalize understanding of the management of HLD including diet, exercise, medications, monitoring with min assist. Outcome: Progressing Goal: RH STG INCREASE KNOWLEDGE OF STROKE PROPHYLAXIS Description: Patient/caregiver will verbalize understanding of the management of stroke prophylaxis including diet, exercise, medications, monitoring with min assist. Outcome: Progressing   Problem: Education: Goal: Ability to demonstrate management of disease process will improve Description: With min assist of caregiver to monitor symptoms and remind patient of fluid restrictions, salt restrictions and daily weight checks Outcome: Progressing Goal: Ability to verbalize understanding of medication therapies will improve Description: Using handouts/educational resources for medications with min assist  Outcome: Progressing Goal: Individualized Educational Video(s) Outcome: Progressing

## 2020-01-20 ENCOUNTER — Inpatient Hospital Stay (HOSPITAL_COMMUNITY): Payer: 59 | Admitting: Occupational Therapy

## 2020-01-20 ENCOUNTER — Inpatient Hospital Stay (HOSPITAL_COMMUNITY): Payer: 59 | Admitting: Speech Pathology

## 2020-01-20 ENCOUNTER — Inpatient Hospital Stay (HOSPITAL_COMMUNITY): Payer: 59 | Admitting: Physical Therapy

## 2020-01-20 LAB — BASIC METABOLIC PANEL
Anion gap: 9 (ref 5–15)
BUN: 13 mg/dL (ref 6–20)
CO2: 23 mmol/L (ref 22–32)
Calcium: 9.2 mg/dL (ref 8.9–10.3)
Chloride: 103 mmol/L (ref 98–111)
Creatinine, Ser: 0.93 mg/dL (ref 0.44–1.00)
GFR calc Af Amer: >60 mL/min (ref >60)
GFR calc non Af Amer: >60 mL/min (ref >60)
Glucose, Bld: 279 mg/dL — ABNORMAL HIGH (ref 70–99)
Potassium: 4.8 mmol/L (ref 3.5–5.1)
Sodium: 135 mmol/L (ref 135–145)

## 2020-01-20 LAB — GLUCOSE, CAPILLARY
Glucose-Capillary: 166 mg/dL — ABNORMAL HIGH (ref 70–99)
Glucose-Capillary: 264 mg/dL — ABNORMAL HIGH (ref 70–99)
Glucose-Capillary: 152 mg/dL — ABNORMAL HIGH (ref 70–99)
Glucose-Capillary: 237 mg/dL — ABNORMAL HIGH (ref 70–99)

## 2020-01-20 MED ORDER — PRO-STAT SUGAR FREE PO LIQD
30.0000 mL | Freq: Two times a day (BID) | ORAL | Status: DC
  Administered 2020-01-20 – 2020-02-02 (×24): 30 mL via ORAL
  Filled 2020-01-20 (×25): qty 30

## 2020-01-20 MED ORDER — GLUCERNA SHAKE PO LIQD
237.0000 mL | Freq: Two times a day (BID) | ORAL | Status: DC
  Administered 2020-01-20 – 2020-01-25 (×10): 237 mL via ORAL
  Filled 2020-01-20: qty 237

## 2020-01-20 NOTE — Progress Notes (Signed)
Speech Language Pathology Weekly Progress and Session Note  Patient Details  Name: Robin [name] MRN: [id number] Date of Birth: 06/29/1966  Beginning of progress report period: January 13, 2020 End of progress report period: January 20, 2020  Today's Date: 01/20/2020 SLP Individual Time: 1100-1156 SLP Individual Time Calculation (min): 56 min  Short Term Goals: Week 2: SLP Short Term Goal 1 (Week 2): Pt will consume current diet with minimal overt s/sx aspiration and efficient mastication and oral clearance with Min A for use of safe swallow strategies. SLP Short Term Goal 1 - Progress (Week 2): Met SLP Short Term Goal 2 (Week 2): Pt will consume trials of advanced solids, demonstrating efficient mastication and oral clearance with no more than Min A cues X3 prior to advancement. SLP Short Term Goal 2 - Progress (Week 2): Met SLP Short Term Goal 3 (Week 2): Pt will name common objects with 25% accuracy provided Max A multimodal cues. SLP Short Term Goal 3 - Progress (Week 2): Progressing toward goal SLP Short Term Goal 4 (Week 2): Pt will imitate at the phoneme level with 50% accuracy provided Max A multimodal cues. SLP Short Term Goal 4 - Progress (Week 2): Progressing toward goal SLP Short Term Goal 5 (Week 2): Pt will express basic wants and needs via gestures or other multimodal means with Mod A cues. SLP Short Term Goal 5 - Progress (Week 2): Met SLP Short Term Goal 6 (Week 2): Pt will sustain attention for functional tasks for 10 minute intervals with Mod A cues for redirection. SLP Short Term Goal 6 - Progress (Week 2): Met    New Short Term Goals: Week 3: SLP Short Term Goal 1 (Week 3): Pt will consume current diet with minimal overt s/sx aspiration and efficient mastication and oral clearance with Supervision A for use of safe swallow strategies. SLP Short Term Goal 2 (Week 3): Pt will verbally name common objects with 25% accuracy provided Max A multimodal cues. SLP Short Term Goal  3 (Week 3): Pt will imitate at the phoneme level with 50% accuracy provided Max A multimodal cues. SLP Short Term Goal 4 (Week 3): Pt will use communication board to express basic wants and needs with Min A multimodal cues. SLP Short Term Goal 5 (Week 3): Pt will sustain attention to functional tasks for 10 minute intervals with Min A cues for redirection. SLP Short Term Goal 6 (Week 3): Pt will detect functional errors in 50% of opportunities with Mod A verbal and visual cues cues.  Weekly Progress Updates: Pt has made functional gains and met 4 out of 6 short term goals this reporting period. Due to severe expressive aphasia, pt required Moderate assist to express her basic wants and needs primarily via gestures and other multimodal means. [name] verbal output is mostly limited to automatic responses; verbalizations are mostly characterized by phonemic paraphasias and neologisms. Vast improvements noted in pt's receptive language and auditory comprehension this week; she is beginning to use a picture communication board for expression. She also demonstrates improvements in emergent awareness and sustained attention this week, requiring Min-Mod A. Pt is consuming upgraded Dysphagia 3 (mechanical soft) diet with thin liquids with only Min A for use of safe swallow precautions. Pt education is ongoing; pt's family would benefit from further education prior to pt's discharge home. Pt would continue to benefit from skilled ST while inpatient in order to maximize functional independence and reduce burden of care prior to discharge. Anticipate that pt will need  24/7 supervision at discharge in addition to [place] follow up at next level of care.      Intensity: Minumum of 1-2 x/day, 30 to 90 minutes Frequency: 3 to 5 out of 7 days Duration/Length of Stay: 02/02/20 Treatment/Interventions: Cognitive remediation/compensation;Cueing hierarchy;Dysphagia/aspiration precaution training;Functional tasks;Environmental  controls;Internal/external aids;Multimodal communication approach;Speech/Language facilitation;Therapeutic Activities;Patient/family education   Daily Session  Skilled Therapeutic Interventions: Pt was seen for skilled ST targeting dysphagia and communication. Pt accepted double portion Dys 3 (mechanical soft) solid trial to assess readiness for advancement. She demonstrated fully efficient mastication and oral clearance and no s/sx aspiration across Dys 3 or thin liquid textures. She also used safe swallow precautions with only Supervision A verbal cues. Would recommend pt upgrade to Dysphagia 3 texture diet, continue thin liquids and full supervision during meals. Of note, regular textured fried chicken found at bedside in pt's room. No family was present, but RN made aware to advise family not to bring outside food until speaking with ST regarding current diet recommendations.  Pt used gestures to communicate buttock pain and desire to get into bed with only Min A verbal cues for clarification from SLP. SLP provided Min A cues for sequencing during transfer from chair to bed via stedy with assistance of NT. Pt with decreased accuracy in use of picture communication board today in comparison to yesterday (~50% accuracy), and therefore gestures (mostly pointing and head nods) are most effective to communicate basic wants and needs at this time. Pt perseverative on /s/ phoneme during session, therefore attempted to shape it into basic CV words. Pt approximated "see" however <25% accuracy achieved. Pt will 1/6 accuracy in producing CV words from pictures (boo) with Max A semantic and orthographic cues. During writing tasks, pt generated her name and street name. Pt able to copy other words and full address with 90% accuracy. Pt able to answer yes/no questions regarding orientation accurately. Pt left laying in bed with alarm set and needs within reach. Continue per current plan of care.      Pain Pain  Assessment Pain Scale: Faces Faces Pain Scale: Hurts little more Pain Type: Acute pain Pain Location: Buttocks Pain Descriptors / Indicators: Grimacing Pain Onset: Gradual Pain Intervention(s): Repositioned Multiple Pain Sites: No  Therapy/Group: Individual Therapy  Arbutus Leas 01/20/2020, 7:09 AM

## 2020-01-20 NOTE — Progress Notes (Addendum)
Physical Therapy Weekly Progress Note  Patient Details  Name: Robin [name] MRN: [id number] Date of Birth: [birth year]  Beginning of progress report period: January 13, 2020 End of progress report period: January 20, 2020 (Addendum: date corrected)   Today's Date: 01/20/2020 PT Individual Time: 0867-6195 PT Individual Time Calculation (min): 72 min   Patient has met 1 of 4 short term goals. Robin [name] is progressing well with therapy demonstrating consistent participation and progression to higher level functional mobility tasks with improved activity tolerance/edurance. She is performing supine<>sit with mod assist, sit<>stands with mod assist, squat pivot transfers with mod assist working towards stand pivot transfers, and has progressed to ambulating up to 86f via 3 Musketeer UE support with +2 mod assist. She continues to demonstrate R inattention, R LE paresis with more activation at hip/knee and no activation noted at ankle, as well as R LE foot/ankle pain but aphasia limiting expression of this pain.   Patient continues to demonstrate the following deficits muscle weakness, muscle joint tightness and muscle paralysis, decreased cardiorespiratoy endurance, impaired timing and sequencing, abnormal tone, unbalanced muscle activation, motor apraxia, decreased coordination and decreased motor planning, decreased attention to right, decreased initiation, decreased attention, decreased awareness, decreased problem solving, decreased safety awareness, decreased memory and delayed processing and decreased sitting balance, decreased standing balance, decreased postural control and decreased balance strategies and therefore will continue to benefit from skilled PT intervention to increase functional independence with mobility.  Patient progressing toward long term goals..  Continue plan of care.  PT Short Term Goals Week 2:  PT Short Term Goal 1 (Week 2): Pt will perform supine<>sit with min assist PT  Short Term Goal 1 - Progress (Week 2): Progressing toward goal PT Short Term Goal 2 (Week 2): Pt will perform bed<>chair transfers using LRAD with mod assist PT Short Term Goal 2 - Progress (Week 2): Met PT Short Term Goal 3 (Week 2): Pt will ambulate at least 373fusing LRAD with mod assist of 1 and +2 w/c follow if needed PT Short Term Goal 3 - Progress (Week 2): Progressing toward goal PT Short Term Goal 4 (Week 2): Pt will initiate stair navigation PT Short Term Goal 4 - Progress (Week 2): Progressing toward goal Week 3:  PT Short Term Goal 1 (Week 3): Pt will perform supine<>sit with min assist PT Short Term Goal 2 (Week 3): Pt will ambulate at least 5079fsing LRAD with mod assist of 1 and +2 w/c follow if needed PT Short Term Goal 3 (Week 3): Pt will initiate stair navigation  Skilled Therapeutic Interventions/Progress Updates:  Ambulation/gait training;Community reintegration;DME/adaptive equipment instruction;Psychosocial support;Neuromuscular re-education;Stair training;UE/LE Strength taining/ROM;Wheelchair propulsion/positioning;Discharge planning;Balance/vestibular training;Functional electrical stimulation;Pain management;Skin care/wound management;Therapeutic Activities;UE/LE Coordination activities;Cognitive remediation/compensation;Disease management/prevention;Functional mobility training;Patient/family education;Splinting/orthotics;Therapeutic Exercise;Visual/perceptual remediation/compensation   Pt received supine in bed, asleep but upon awakening agreeable to therapy session. Therapist donned ACE wraps on B LE to the knees over the thigh high TED hose due to pt having orthostatic hypotension during OT session this AM. Pt continues to demo significant R ankle/foot pain with significant sensitivity to touch and movement - pt wearing R ankle brace during session. Supine>sitting R EOB with min assist via L HHA for trunk upright. R stand pivot to w/c with mod assist for balance and  manual facilitation for R/L lateral weight shifting to promote increased R LE lateral step.  Transported to/from gym in w/c for time management and energy conservation. Sit<>stands to/from w/c with cuing for bringing R foot back for  improved BOS prior to standing and pushing L hand on w/c arm rest with min/mod assist for lifting/balance during session.   Gait training 67f, 661f and 7757fseated breaks between) with B UE support via 3Musketeers and 3rd Dittmer assist for wheelchair follow; +2 mod assist - pt demonstrates ability to initiate R LE swing phase but has foot drag and very minimal hip/knee flexion requiring max assist to advance LE and position prior to stance phase (has excessive hip external rotation as well) - requires mod assist to block R knee buckle or hyperextension during stance - manual facilitation for R/L lateral weight shift onto stance limb. Vitals after 1st walk: BP 102/78 (MAP 86), HR 74bpm; after 2nd walk: BP 129/73 (MAP 90), HR 83bpm  L squat pivot to EOM with min/mod assist for pivoting hips. Seated EOM performed  RLE NMR of long arc quads x10reps and hamstring curls against level 2 theraband resistance x10reps - external targets provided during to promote increased ROM and increased muscle activation. Standing R LE NMR targeting swing phase of gait via R LE foot taps on/off 4" step progressed to 8" step without UE support - mod assist for balance and mirror feedback - external targets and barriers provided to avoid compensatory strategy of excessive hip external rotation and looping foot around step so that more hip/knee flexion would occur. Attempted ~4reps of R LE NMR targeting stance phase while tapping L foot on/off 8" step but pt reports pain in R ankle/foot (difficult to determine exactly what her symptoms are due to aphasia). Pt did experience some possible dizziness during these standing tasks so vitals reassessed :BP 138/82 (MAP 98), HR 90bpm - therapist removed ACE wraps  on L LE. Transported back to room and left seated in TIS w/c with needs in reach and seat belt alarm on  Therapy Documentation Precautions:  Precautions Precautions: Fall Precaution Comments: R hemiparesis, R inattention Restrictions Weight Bearing Restrictions: No  Pain: Continues to have sensitivity to touch and movement in R foot/ankle and unable to perform single leg stance on R LE while tapping step with LLE due to pain - limited certain activities and provided seated rest breaks for pain management - ankle brace worn throughout session.   Therapy/Group: Individual Therapy  CarTawana ScaleT, DPT 01/20/2020, 12:36 PM

## 2020-01-20 NOTE — Progress Notes (Signed)
Occupational Therapy Session Note  Patient Details  Name: Robin [name] MRN: [id number] Date of Birth: 03-21-1966  Today's Date: 01/20/2020 OT Individual Time: 0902-1002 OT Individual Time Calculation (min): 60 min    Short Term Goals: Week 2:  OT Short Term Goal 1 (Week 2): Pt will complete UB bathing with no more than mod instructional cueing and min assist. OT Short Term Goal 1 - Progress (Week 2): Met OT Short Term Goal 2 (Week 2): Pt will donn a pullover shirt with no more than min assist and min demonstrational cueing following hemi dressing techniques. OT Short Term Goal 2 - Progress (Week 2): Not met OT Short Term Goal 3 (Week 2): Pt will complete LB bathing with mod assist sit to stand for 2 consecutive sessions. OT Short Term Goal 3 - Progress (Week 2): Met OT Short Term Goal 4 (Week 2): Pt will use the RUE as a gross assist with selfcare tasks and mod facilitation. OT Short Term Goal 4 - Progress (Week 2): Not met  Skilled Therapeutic Interventions/Progress Updates:    Pt completed toilet transfer and toileting to start session.  He was able to complete supine to sit EOB with min assist and then transferred to the wheelchair with mod assist stand pivot.  She completed toilet transfer with mod assist as well and completion of clothing management and toilet hygiene.  She then used the grab bars for transfer to the shower bench with mod assist.  Therapist provided mod facilitation at the right knee to maintain knee extension with weightbearing.  She was able to complete all UB bathing with min assist as well as LB bathing with mod assist sit to stand.  Slight increased pushing to the right in standing noted when attempting to complete peri washing.  She dried off and then transferred over to the wheelchair at [facility] assist level.  BP taken in sitting after moving wheelchair out to the sink at 89/61 and 94/62.  This was taken again after donning of TEDS and LB clothing.  It was 109/68 at  this time.  She was able to donn her pullover shirt with mod assist during dressing.  She donned her brief and pants at mod assist level as well.  Finished session with pt up in the wheelchair with slight tilt for comfort.  Her call button and phone were in reach with safety belt in place.    Therapy Documentation Precautions:  Precautions Precautions: Fall Precaution Comments: R hemiparesis, R inattention Restrictions Weight Bearing Restrictions: No  Pain: Pain Assessment Pain Scale: Faces Faces Pain Scale: Hurts little more Pain Type: Acute pain Pain Location: Buttocks Pain Orientation: Right Pain Descriptors / Indicators: Grimacing Pain Onset: Gradual Pain Intervention(s): Repositioned Multiple Pain Sites: No ADL: See Care Tool Section for some details of mobility and selfcare  Therapy/Group: Individual Therapy  Coner Gibbard OTR/L 01/20/2020, 12:36 PM

## 2020-01-20 NOTE — Progress Notes (Signed)
[place] PHYSICAL MEDICINE & REHABILITATION PROGRESS NOTE   Subjective/Complaints:  Cortrak out , good fluid intake 6/15, fair on 6/16  ROS: limited due to language/communication   Objective:   No results found. No results for input(s): WBC, HGB, HCT, PLT in the last 72 hours. No results for input(s): NA, K, CL, CO2, GLUCOSE, BUN, CREATININE, CALCIUM in the last 72 hours.  Intake/Output Summary (Last 24 hours) at 01/20/2020 0751 Last data filed at 01/19/2020 1300 Gross per 24 hour  Intake 356 ml  Output --  Net 356 ml     Physical Exam: Vital Signs Blood pressure 121/81, pulse 78, temperature 98.4 F (36.9 C), resp. rate 16, weight 96.6 kg, last menstrual period 08/21/2011, SpO2 99 %.    General: No acute distress Mood and affect are appropriate Heart: Regular rate and rhythm no rubs murmurs or extra sounds Lungs: Clear to auscultation, breathing unlabored, no rales or wheezes Abdomen: Positive bowel sounds, soft nontender to palpation, nondistended Extremities: No clubbing, cyanosis, or edema Skin: No evidence of breakdown, no evidence of rash   Neurologic: awake, aphasic, language limited, inconsistent. Cranial nerves II through XII intact, motor strength is grossly 4- 5/5 in left . 3-/5 RIght  deltoid, bicep, tricep, grip, 5/5 left  0/5 right hip flexor, knee extensors, ankle dorsiflexor and plantar flexor,2- right hip ext Sensory withdraws to pain L>R     Musculoskeletal:wearing ASO   Assessment/Plan: 1. Functional deficits secondary to Cardioembolic Left MCA infarct  which require 3+ hours per day of interdisciplinary therapy in a comprehensive inpatient rehab setting.  Physiatrist is providing close team supervision and 24 hour management of active medical problems listed below.  Physiatrist and rehab team continue to assess barriers to discharge/monitor patient progress toward functional and medical goals  Care Tool:  Bathing    Body parts bathed by  patient: Right arm, Chest, Abdomen, Front perineal area, Right upper leg, Left upper leg, Face, Left lower leg   Body parts bathed by helper: Right lower leg, Left arm, Buttocks     Bathing assist Assist Level: Maximal Assistance - Patient 24 - 49%     Upper Body Dressing/Undressing Upper body dressing   What is the patient wearing?: Pull over shirt    Upper body assist Assist Level: Moderate Assistance - Patient 50 - 74%    Lower Body Dressing/Undressing Lower body dressing      What is the patient wearing?: Incontinence brief, Pants     Lower body assist Assist for lower body dressing: Moderate Assistance - Patient 50 - 74%     Toileting Toileting    Toileting assist Assist for toileting: Maximal Assistance - Patient 25 - 49%     Transfers Chair/bed transfer  Transfers assist  Chair/bed transfer activity did not occur: Safety/medical concerns  Chair/bed transfer assist level: Moderate Assistance - Patient 50 - 74% (squat pivot)     Locomotion Ambulation   Ambulation assist   Ambulation activity did not occur: Safety/medical concerns  Assist level: 2 helpers (max A of 1 and +2 w/c follow) Assistive device: Other (comment) (L hallway rail) Max distance: 8ft   Walk 10 feet activity   Assist  Walk 10 feet activity did not occur: Safety/medical concerns  Assist level: 2 helpers (max A of 1 and +2 w/c follow) Assistive device: Other (comment) (L hallway rail)   Walk 50 feet activity   Assist Walk 50 feet with 2 turns activity did not occur: Safety/medical concerns  Walk 150 feet activity   Assist Walk 150 feet activity did not occur: Safety/medical concerns         Walk 10 feet on uneven surface  activity   Assist Walk 10 feet on uneven surfaces activity did not occur: Safety/medical concerns         Wheelchair     Assist Will patient use wheelchair at discharge?:  (TBD)             Wheelchair 50 feet with 2  turns activity    Assist            Wheelchair 150 feet activity     Assist          Blood pressure 121/81, pulse 78, temperature 98.4 F (36.9 C), resp. rate 16, weight 96.6 kg, last menstrual period 08/21/2011, SpO2 99 %.  Medical Problem List and Plan: 1.  Right-sided hemiparesis and aphasia secondary to left MCA infarction/petechial hemorrhage with saccular aneurysm at the paraclinoid right ICA status post mechanical thrombectomy  Continue CIR , will extend stay , slow progress     WHO/PRAFO qhs  2.  Antithrombotics: -DVT/anticoagulation: Eliquis             -antiplatelet therapy: N/A 3. Pain Management: Neurontin 100 mg every 8 hours, Zanaflex 2 mg twice daily as needed  Appears controlled on 6/17             Monitor with increased exertion 4. Mood: Provide emotional support- cannot assess accurately related to severe aphasia, empirically started on citalopram for "flat affect"  Which may be MCA infarct effect              -antipsychotic agents: N/A 5. Neuropsych: This patient is not capable of making decisions on her own behalf. 6. Skin/Wound Care: Routine skin checks 7. Fluids/Electrolytes/Nutrition: Routine in and outs.  Oral intake improving 75-100% will cont megace , d/c TF, recheck BMET if ok then can d/c midline  8.  Post stroke dysphagia.    Dysphagia #1 pudding thick liquids.  Follow-up speech therapy.  Advance diet as tolerated 9.  Hypertension.  Coreg 50 mg twice daily,Bidil 20-37.5 mg 2 tabs 3 times daily            Vitals:   01/19/20 1947 01/20/20 0347  BP: 128/75 121/81  Pulse: 90 78  Resp: 18 16  Temp: 97.8 F (36.6 C) 98.4 F (36.9 C)  SpO2: 97% 99%   Controlled on 6/17 10.  Atrial fibrillation.  Lanoxin 0.125 mg daily.  Cardiac rate controlled 11.  Chronic diastolic congestive heart failure.  Demadex 20 mg daily.  Monitor for any signs of fluid overload Filed Weights   01/18/20 0500 01/19/20 0453 01/20/20 0429  Weight: 97.1 kg 96.6 kg  96.6 kg   Stable weights, overall fluid intake reduced now that TF is off  12.  Diabetes mellitus with peripheral neuropathy.  Hemoglobin A1c 11.6.  NovoLog 2 units every 4 hours, Lantus insulin 60 units nightly, Glucophage 500 mg twice daily CBG (last 3)  Recent Labs    01/19/20 1732 01/19/20 2116 01/20/20 0609  GLUCAP 246* 263* 166*    Increase to 70U, intake improving , now off TF ,am CBG improved             Monitor with increased mobility 13.  COPD with tobacco abuse.  Continue nebulizer treatments 14.  Polysubstance abuse.  Urine drug screen positive cocaine.    Counsel when appropriate 15.  Hyperlipidemia.  Crestor  16.  GERD.  Protonix 17.  AKI Creat ok mainly prerenal azotemia with elevated BUN - no need to hold metformin at this time  18. RIght  ATFL sprain, order ankle splint, may use Ice , Remove at noc  19.  Urinary retention may have diabetic cystopathy  -appears improved with urecholine--continue, Off FLomax due to orthostatic hypotension, caths only at night discussed with nsg needs to try voiding on toilet or BSC at noc to avoid caths    LOS: 15 days A FACE TO [place] E Ilea Hilton 01/20/2020, 7:51 AM

## 2020-01-20 NOTE — Progress Notes (Signed)
Nutrition Follow-up  RD working remotely.  DOCUMENTATION CODES:   Obesity unspecified  INTERVENTION:   - Continue MVI with minerals daily  - Pro-stat 30 ml po BID, each supplement provides 100 kcal and 15 grams of protein  - Glucerna Shake po BID with meals, each supplement provides 220 kcal and 10 grams of protein  - d/c free water flushes now that Cortrak tube has been removed  NUTRITION DIAGNOSIS:   Inadequate oral intake related to dysphagia as evidenced by meal completion < 25%.  Progressing  GOAL:   Patient will meet greater than or equal to 90% of their needs  Progressing  MONITOR:   PO intake, Supplement acceptance, Labs, Weight trends  REASON FOR ASSESSMENT:   Consult Enteral/tube feeding initiation and management  ASSESSMENT:   54 year old female with PMH of CHF, HTN, atrial fibrillation, COPD, polysubstance abuse, DM, and tobacco abuse. Presented on 12/27/19 with right hemiparesis, AMS, and aphasia. MRI showed evolving left MCA infarction with hemorrhagic transformation. Pt underwent mechanical thrombectomy per interventional radiology and required intubation and was extubated 12/28/19. Started on dysphagia 1 diet with pudding-thick liquids with Cortrak tube in place for nutritional support. Admitted to CIR on 01/05/20.  6/11 - MBS, diet advanced to dysphagia 2 with thin liquids 6/15 - TF d/c 6/16 - Cortrak removed  Noted target d/c date of 02/02/20.  Per MD note, pt's family member has brought pt some outside food.  Weight stable over the last several days and stable compared to admit weight.  RD will order oral nutrition supplements to aid pt in meeting kcal and protein needs.  Meal Completion: 40-100% x last 8 recorded meals (averaging 61%)  Medications reviewed and include: Pro-stat 30 ml BID, SSI, Lantus 70 units daily, Megace 400 mg BID, Metformin, MVI with minerals, protonix, klor-con 40 mEq daily, torsemide  Labs reviewed. CBG's: 166-263 x 24  hours  Diet Order:   Diet Order            DIET DYS 2 Room service appropriate? Yes; Fluid consistency: Thin  Diet effective now                 EDUCATION NEEDS:   No education needs have been identified at this time  Skin:  Skin Assessment: Reviewed RN Assessment (MASD perineum, blister to right thigh)  Last BM:  01/19/20  Height:   Ht Readings from Last 1 Encounters:  12/31/19 5\' 6"  (1.676 m)    Weight:   Wt Readings from Last 1 Encounters:  01/20/20 96.6 kg    Ideal Body Weight:  59.1 kg  BMI:  Body mass index is 34.37 kg/m.  Estimated Nutritional Needs:   Kcal:  1800-2000  Protein:  100-115 grams  Fluid:  1.8-2.0 L    Gaynell Face, MS, RD, LDN Inpatient Clinical Dietitian Pager: [phone] Weekend/After Hours: [phone]

## 2020-01-21 ENCOUNTER — Inpatient Hospital Stay (HOSPITAL_COMMUNITY): Payer: 59 | Admitting: Physical Therapy

## 2020-01-21 ENCOUNTER — Encounter (HOSPITAL_COMMUNITY): Payer: 59 | Admitting: Psychology

## 2020-01-21 ENCOUNTER — Inpatient Hospital Stay (HOSPITAL_COMMUNITY): Payer: 59 | Admitting: Speech Pathology

## 2020-01-21 ENCOUNTER — Inpatient Hospital Stay (HOSPITAL_COMMUNITY): Payer: 59 | Admitting: Occupational Therapy

## 2020-01-21 DIAGNOSIS — R4701 Aphasia: Secondary | ICD-10-CM

## 2020-01-21 LAB — GLUCOSE, CAPILLARY
Glucose-Capillary: 189 mg/dL — ABNORMAL HIGH (ref 70–99)
Glucose-Capillary: 221 mg/dL — ABNORMAL HIGH (ref 70–99)
Glucose-Capillary: 398 mg/dL — ABNORMAL HIGH (ref 70–99)
Glucose-Capillary: 335 mg/dL — ABNORMAL HIGH (ref 70–99)

## 2020-01-21 NOTE — Progress Notes (Signed)
Speech Language Pathology Daily Session Note  Patient Details  Name: Robin [name] MRN: [id number] Date of Birth: 1966/04/25  Today's Date: 01/21/2020 SLP Individual Time: 4680-3212 SLP Individual Time Calculation (min): 55 min  Short Term Goals: Week 3: SLP Short Term Goal 1 (Week 3): Pt will consume current diet with minimal overt s/sx aspiration and efficient mastication and oral clearance with Supervision A for use of safe swallow strategies. SLP Short Term Goal 2 (Week 3): Pt will verbally name common objects with 25% accuracy provided Max A multimodal cues. SLP Short Term Goal 3 (Week 3): Pt will imitate at the phoneme level with 50% accuracy provided Max A multimodal cues. SLP Short Term Goal 4 (Week 3): Pt will use communication board to express basic wants and needs with Min A multimodal cues. SLP Short Term Goal 5 (Week 3): Pt will sustain attention to functional tasks for 10 minute intervals with Min A cues for redirection. SLP Short Term Goal 6 (Week 3): Pt will detect functional errors in 50% of opportunities with Mod A verbal and visual cues cues.  Skilled Therapeutic Interventions: Pt was seen for skilled ST targeting communication. Pt demonstrated ability to use picture communication board with 70% accuracy today. Pt self-corrected error X1. When provided with description of object function, pt identified objects associated with function via pointing (from field of 3) with 90% accuracy. Right visual inattention noted to impact her functional use of communication board and object identification today. She also imitated phonemes with 50% accuracy when provided Max A models and articulatory cues (successful imitations were "ah" and "ee" - unable to produce "ou" and mm"). [name] generative speech is still fluent but characterized by phonemic paraphasias and neologisms, other than yes/no and some other automatic responses to questions. Pt verbalized at the word level today with 6/10  accuracy - pt most responsive to orthographic (written) and semantic sentence completion cues during these attempts (successful verbalizations included "the" "TV" "see" "bathroom" "hungry" and "socks" + approximation of "blankie"). Pt sustained her attention with Supervision A verbal cues for redirection today, and required no more than Min A for awareness of verbal errors. Pt left laying in bed with alarm set and needs within reach. Continue per current plan of care.        Pain Pain Assessment Pain Scale: 0-10 Pain Score: 0-No pain  Therapy/Group: Individual Therapy  Arbutus Leas 01/21/2020, 7:31 AM

## 2020-01-21 NOTE — Consult Note (Signed)
Neuropsychological Consultation   Patient:   Robin [name]   DOB:   [birth year]  MR Number:  [id number]  Location:  [facility] [place] [facility] [place] [id number] [place] [address] Dept: [place]: [phone]           Date of Service:   01/21/2020  Start Time:   8 AM End Time:   9 AM  Provider/Observer:  Ilean Skill, Psy.D.       Clinical Neuropsychologist       Billing Code/Service: 03546  Chief Complaint:    [name]. Innes is a 54 year old right-handed female with history of diastolic congestive heart failure, hypertension, A. fib, COPD, polysubstance abuse, diabetes and tobacco abuse.  The patient presented on 12/27/2019 with right hemiparesis, altered mental status and aphasia.  She presented to [facility] in [place].  Cranial CT scan showed a question of evolving hypodensities in the left basal ganglia, left insula and adjacent left temporal lobe.  Patient transferred to [facility].  MRI showed evolving left MCA infarction with hemorrhagic transformation.  There was a mass-effect of 6 mm rightward midline shift.  Urine drug screen at admission was positive for cocaine.  Hospital course and complications due to dysphagia.  Patient with significant and severe nonfluent expressive aphasia and significant hemiparesis on right side.  Reason for Service:  Patient was referred for neuropsychological consultation due to coping and adjustment issues following significant left MCA stroke with hemorrhagic transformation.  Below is the HPI for the consultation.  HPI: Robin [name] is a 54 year old right-handed female history of diastolic congestive heart failure, hypertension, atrial fibrillation on Eliquis, COPD, polysubstance abuse, diabetes mellitus and tobacco abuse.  He presented on 12/27/2019 with right hemiparesis, AMS, and aphasia to [facility].  Cranial CT scan showed  question evolving hypodensity involving the left basal ganglia left insula and adjacent left temporal lobe.  CT perfusion scan as well as CTA of head and neck showed a 24 cc acute infarct deep and subcortical white matter of the anterior left frontal region.  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  Patient did not receive TPA.  Patient was transferred to [facility].  MRI showed evolving left MCA infarction with hemorrhagic transformation.  Mass-effect with 6 mm rightward midline shift.  Admission chemistries urine drug screen positive cocaine, glucose 355, creatinine 1.15, troponin negative, BNP 367, alcohol negative.  Echocardiogram with ejection fraction of 35% with global hypokinesis.  Patient underwent mechanical thrombectomy per interventional radiology required intubation and was extubated 12/28/2019.  Initially maintained on low-dose aspirin and transitioned back to Eliquis.  Hospital course further complicated by dysphagia.  Started on dysphagia #1 pudding thick liquid diet with nasogastric tube for nutritional support.  Therapy evaluations completed and patient was admitted for a comprehensive rehab program.  Please see preadmission assessment from earlier today as well.  Current Status:  Upon entering the room, the patient was lying in her bed with her upper torso slightly elevated.  The patient was awake and alert throughout.  There were significant issues with expressive aphasia and while she was able to adequately express basic information including yes nose and other indications in individual words the patient was never able to produce a full sentence or well organized statements.  However, the patient appeared to have no issues with receptive language and also appeared to know exactly what she wanted  to say but it was the production of language that was impaired.  The patient appeared to be well oriented and acknowledged past history of substance abuse as well as recent  use of cocaine before her stroke.  The patient did become emotional at 1 point and was rather tearful but this did not last.  The patient reports that her mood in general is stable and that distressing mood states or not impacting her ability to be able to fully participate in therapeutic efforts.  Behavioral Observation: Robin [name]  presents as a 55 y.o.-year-old Right African American Female who appeared her stated age. her dress was Appropriate and she was Well Groomed and her manners were Appropriate to the situation.  her participation was indicative of Appropriate and Redirectable behaviors.  There were physical disabilities noted.  she displayed an appropriate level of cooperation and motivation.     Interactions:    Active Appropriate and Redirectable  Attention:   abnormal and attention span appeared shorter than expected for age  Memory:   within normal limits; recent and remote memory intact  Visuo-spatial:  not examined  Speech (Volume):  low  Speech:   non-fluent aphasia; slurred  Thought Process:  Coherent and Relevant  Though Content:  WNL; not suicidal and not homicidal  Orientation:   Cwik, place, time/date and situation  Judgment:   Fair  Planning:   Poor  Affect:    Depressed, Flat and Tearful  Mood:    Dysphoric  Insight:   Fair  Intelligence:   normal  Medical History:   Past Medical History:  Diagnosis Date  . Allergy   . Anemia   . Arthritis   . CHF (congestive heart failure) ([place])    a. EF at 30-35% by echo in 08/2018 b. EF at 35% by repeat echo in 05/2019  . Chronic abdominal pain   . Cocaine abuse ([place])   . COPD (chronic obstructive pulmonary disease) ([place])   . Essential hypertension, benign   . Fatty liver   . GERD (gastroesophageal reflux disease)   . Gout 2016  . Normal coronary arteries    3/10 - following abnormal Myoview  . Ovarian cyst   . Type 2 diabetes mellitus ([place])     Psychiatric History:  Notes prior psychiatric  illness noted.  Family Med/Psych History:  Family History  Problem Relation Age of Onset  . Cirrhosis Mother   . Early death Mother   . Alcohol abuse Mother   . Diabetes type II Father   . Alcohol abuse Father   . Diabetes type II Sister   . Early death Brother   . Alcohol abuse Son   . Anesthesia problems Neg Hx   . Hypotension Neg Hx   . Malignant hyperthermia Neg Hx   . Pseudochol deficiency Neg Hx   . Colon cancer Neg Hx   . Colon polyps Neg Hx   . Esophageal cancer Neg Hx   . Rectal cancer Neg Hx   . Stomach cancer Neg Hx    Impression/DX:  Robin [name] is a 54 year old right-handed female with history of diastolic congestive heart failure, hypertension, A. fib, COPD, polysubstance abuse, diabetes and tobacco abuse.  The patient presented on 12/27/2019 with right hemiparesis, altered mental status and aphasia.  She presented to [facility] in [place].  Cranial CT scan showed a question of evolving hypodensities in the left basal ganglia, left insula and adjacent left temporal lobe.  Patient transferred  to [facility].  MRI showed evolving left MCA infarction with hemorrhagic transformation.  There was a mass-effect of 6 mm rightward midline shift.  Urine drug screen at admission was positive for cocaine.  Hospital course and complications due to dysphagia.  Patient with significant and severe nonfluent expressive aphasia and significant hemiparesis on right side.  Upon entering the room, the patient was lying in her bed with her upper torso slightly elevated.  The patient was awake and alert throughout.  There were significant issues with expressive aphasia and while she was able to adequately express basic information including yes nose and other indications in individual words the patient was never able to produce a full sentence or well organized statements.  However, the patient appeared to have no issues with receptive language and also appeared to know  exactly what she wanted to say but it was the production of language that was impaired.  The patient appeared to be well oriented and acknowledged past history of substance abuse as well as recent use of cocaine before her stroke.  The patient did become emotional at 1 point and was rather tearful but this did not last.  The patient reports that her mood in general is stable and that distressing mood states or not impacting her ability to be able to fully participate in therapeutic efforts.   Disposition/Plan:  Today we worked on coping and adjustment issues and understanding ways to work around her severe expressive language deficits.  The patient reports that she is able to understand and actively participate in therapeutic efforts including OT/PT/speech.  Diagnosis:    Left middle cerebral artery stroke [facility]) - Plan: Ambulatory referral to Neurology  Hypokalemia - Plan: potassium chloride (KLOR-CON) packet 40 mEq         Electronically Signed   _______________________ Ilean Skill, Psy.D.

## 2020-01-21 NOTE — Progress Notes (Signed)
Occupational Therapy Session Note  Patient Details  Name: Robin [name] MRN: [id number] Date of Birth: Jul 08, 1966  Today's Date: 01/21/2020 OT Individual Time: 7124-5809 OT Individual Time Calculation (min): 41 min    Short Term Goals: Week 3:  OT Short Term Goal 1 (Week 3): Pt will donn a pullover shirt with no more than min assist and min demonstrational cueing following hemi dressing techniques. OT Short Term Goal 2 (Week 3): Pt will use the RUE as a gross assist with selfcare tasks and mod facilitation. OT Short Term Goal 3 (Week 3): Pt will complete toilet transfers with mod assist stand pivot for 3 consecutive sessions. OT Short Term Goal 4 (Week 3): Pt will complete LB dressing sit to stand with min assist excluding TEDs.  Skilled Therapeutic Interventions/Progress Updates:    Session 1 [phone]) Pt up in wheelchair visiting with family to begin session.  She was taken down to the therapy gym for session with transfer to the therapy mat with mod assist to begin at stand pivot method.  She was able to complete functional mobility in the therapy gym with total assist +2 (pt 50%).  She was able to advance the RLE 75% of the time, but needed assist to keep it from adducting as well as mod facilitation to maintain knee and hip extension during stance phase on the right.  She worked on sit to squat transitions for increased activation of the left knee and hip for 2 intervals and 5 repetitions each.  Attempted quadriped with pt getting into the position with total +2 assistance, however she was not able to maintain secondary to reports of increased right leg pain in this position.  This was noted by pt grimacing and shaking her head.  Also had pt work in standing on pre-gait activity stepping forward with the LLE to target while maintaining weightbearing through the RLE.  Mod assist for balance and for right knee and hip stability to complete this.  Finished session with transfer back to the room  and pt staying up in the tilt in space wheelchair in preparation for lunch.  Call button in reach with safety belt in place.    Session 2 (1503-1530)  Pt in bed during session, worked on Clinical research associate.  Had pt work on reaching for her cup with the RUE with overall mod to max facilitation, and then bring to her mouth for drinking.  Also had her work in supine on shoulder flexion, pushing the RUE up to the ceiling to approximately 90 degrees shoulder flexion with mod assist.  She completed 2 sets of 10 for this as well as elbow flexion/extension AAROM.  Worked on gross digit flexion/extension with  increased motor apraxia noted, as pt can at sometimes exhibit AROM for flexion and extension at approximately 75% of normal ROM, but this is not consistent.  Finished session with education on self AAROM for the elbow and shoulder with pt completing return demonstration with min assist.  She was left with call button and phone in reach and safety bed alarm in place.      Therapy Documentation Precautions:  Precautions Precautions: Fall Precaution Comments: R hemiparesis, R inattention Restrictions Weight Bearing Restrictions: No  Pain: Pain Assessment Pain Scale: 0-10 Pain Score: 0-No pain ADL: See Care Tool Section for some details of mobility and selfcare  Therapy/Group: Individual Therapy  Mattisyn Cardona OTR/L 01/21/2020, 4:51 PM

## 2020-01-21 NOTE — Progress Notes (Signed)
Robin [name]:  No issues overnite   ROS: limited due to language/communication   Objective:   No results found. No results for input(s): WBC, HGB, HCT, PLT in the last 72 hours. Recent Labs    01/20/20 1051  NA 135  K 4.8  CL 103  CO2 23  GLUCOSE 279*  BUN 13  CREATININE 0.93  CALCIUM 9.2    Intake/Output Summary (Last 24 hours) at 01/21/2020 0815 Last data filed at 01/20/2020 1802 Gross per 24 hour  Intake 550 ml  Output 0 ml  Net 550 ml     Physical Exam: Vital Signs Blood pressure 122/73, pulse 86, temperature 97.9 F (36.6 C), temperature source Oral, resp. rate 16, weight 98.6 kg, last menstrual period 08/21/2011, SpO2 97 %.  General: No acute distress Mood and affect are appropriate Heart: Regular rate and rhythm no rubs murmurs or extra sounds Lungs: Clear to auscultation, breathing unlabored, no rales or wheezes Abdomen: Positive bowel sounds, soft nontender to palpation, nondistended Extremities: No clubbing, cyanosis, or edema Skin: No evidence of breakdown, no evidence of rash  Neurologic: awake, aphasic, language limited, inconsistent. Cranial nerves II through XII intact, motor strength is grossly 4- 5/5 in left . 3-/5 RIght  deltoid, bicep, tricep, grip, 5/5 left  0/5 right hip flexor, knee extensors, ankle dorsiflexor and plantar flexor,2- right hip ext Sensory withdraws to pain L>R     Musculoskeletal:wearing ASO   Assessment/Plan: 1. Functional deficits secondary to Cardioembolic Left MCA infarct  which require 3+ hours per day of interdisciplinary therapy in a comprehensive inpatient rehab setting.  Physiatrist is providing close team supervision and 24 hour management of active medical problems listed below.  Physiatrist and rehab team continue to assess barriers to discharge/monitor patient progress toward functional and medical goals  Care Tool:  Bathing    Body  parts bathed by patient: Right arm, Chest, Abdomen, Front perineal area, Right upper leg, Left upper leg, Face, Left lower leg   Body parts bathed by helper: Right lower leg, Left arm, Buttocks     Bathing assist Assist Level: Maximal Assistance - Patient 24 - 49%     Upper Body Dressing/Undressing Upper body dressing   What is the patient wearing?: Pull over shirt    Upper body assist Assist Level: Moderate Assistance - Patient 50 - 74%    Lower Body Dressing/Undressing Lower body dressing      What is the patient wearing?: Incontinence brief, Pants     Lower body assist Assist for lower body dressing: Moderate Assistance - Patient 50 - 74%     Toileting Toileting    Toileting assist Assist for toileting: Moderate Assistance - Patient 50 - 74%     Transfers Chair/bed transfer  Transfers assist  Chair/bed transfer activity did not occur: Safety/medical concerns  Chair/bed transfer assist level: Moderate Assistance - Patient 50 - 74% (squat pivot)     Locomotion Ambulation   Ambulation assist   Ambulation activity did not occur: Safety/medical concerns  Assist level: 2 helpers (3 Musketeer mod A) Assistive device: Other (comment) (3 Musketeer) Max distance: 11ft   Walk 10 feet activity   Assist  Walk 10 feet activity did not occur: Safety/medical concerns  Assist level: 2 helpers (3 Musketeer mod A) Assistive device: Other (comment), Hand held assist (3 Musketeer)   Walk 50 feet activity   Assist Walk 50 feet with 2 turns activity did not occur:  (did  not perform turns)         Walk 150 feet activity   Assist Walk 150 feet activity did not occur: Safety/medical concerns         Walk 10 feet on uneven surface  activity   Assist Walk 10 feet on uneven surfaces activity did not occur: Safety/medical concerns         Wheelchair     Assist Will patient use wheelchair at discharge?:  (TBD)             Wheelchair 50 feet  with 2 turns activity    Assist            Wheelchair 150 feet activity     Assist          Blood pressure 122/73, pulse 86, temperature 97.9 F (36.6 C), temperature source Oral, resp. rate 16, weight 98.6 kg, last menstrual period 08/21/2011, SpO2 97 %.  Medical Problem List and Plan: 1.  Right-sided hemiparesis and aphasia secondary to left MCA infarction/petechial hemorrhage with saccular aneurysm at the paraclinoid right ICA status post mechanical thrombectomy  Continue CIR , will extend stay , slow progress     WHO/PRAFO qhs  2.  Antithrombotics: -DVT/anticoagulation: Eliquis             -antiplatelet therapy: N/A 3. Pain Management: Neurontin 100 mg every 8 hours, Zanaflex 2 mg twice daily as needed  Appears controlled on 6/17             Monitor with increased exertion 4. Mood: Provide emotional support- cannot assess accurately related to severe aphasia, empirically started on citalopram for "flat affect"  Which may be MCA infarct effect              -antipsychotic agents: N/A 5. Neuropsych: This patient is not capable of making decisions on her own behalf. 6. Skin/Wound Care: Routine skin checks 7. Fluids/Electrolytes/Nutrition: Routine in and outs.  Oral intake improving 75-100% will cont megace , d/c TF, recheck BMET  ok  can d/c midline  8.  Post stroke dysphagia.    Dysphagia #1 pudding thick liquids.  Follow-up speech therapy.  Advance diet as tolerated 9.  Hypertension.  Coreg 50 mg twice daily,Bidil 20-37.5 mg 2 tabs 3 times daily            Vitals:   01/20/20 1926 01/21/20 0512  BP: 114/68 122/73  Pulse: 81 86  Resp: 18 16  Temp: 98.2 F (36.8 C) 97.9 F (36.6 C)  SpO2: 100% 97%   Controlled on 6/18 10.  Atrial fibrillation.  Lanoxin 0.125 mg daily.  Cardiac rate controlled 11.  Chronic diastolic congestive heart failure.  Demadex 20 mg daily.  Monitor for any signs of fluid overload Filed Weights   01/19/20 0453 01/20/20 0429 01/21/20 0512   Weight: 96.6 kg 96.6 kg 98.6 kg   Stable weights, overall fluid intake reduced now that TF is off  12.  Diabetes mellitus with peripheral neuropathy.  Hemoglobin A1c 11.6.  NovoLog 2 units every 4 hours, Lantus insulin 60 units nightly, Glucophage 500 mg twice daily CBG (last 3)  Recent Labs    01/20/20 1618 01/20/20 2102 01/21/20 0607  GLUCAP 152* 237* 189*    Increase to 70U, intake improving , now off TF ,am CBG improved             Monitor with increased mobility 13.  COPD with tobacco abuse.  Continue nebulizer treatments 14.  Polysubstance abuse.  Urine drug  screen positive cocaine.    Counsel when appropriate 15.  Hyperlipidemia.  Crestor 16.  GERD.  Protonix 17.  AKI Creat ok mainly prerenal azotemia with elevated BUN - no need to hold metformin at this time  18. RIght  ATFL sprain, order ankle splint, may use Ice , Remove at noc  19.  Urinary retention may have diabetic cystopathy  -appears improved with urecholine--continue, Off FLomax due to orthostatic hypotension, caths only at night discussed with nsg needs to try voiding on toilet or BSC at noc to avoid caths    LOS: 16 days A FACE TO [place] E Mauro Arps 01/21/2020, 8:15 AM

## 2020-01-21 NOTE — Progress Notes (Signed)
Physical Therapy Session Note  Patient Details  Name: Robin [name] MRN: [id number] Date of Birth: [birth year]  Today's Date: 01/21/2020 PT Individual Time: 7106-2694 PT Individual Time Calculation (min): 41 min   Short Term Goals: Week 3:  PT Short Term Goal 1 (Week 3): Pt will perform supine<>sit with min assist PT Short Term Goal 2 (Week 3): Pt will ambulate at least 46ft using LRAD with mod assist of 1 and +2 w/c follow if needed PT Short Term Goal 3 (Week 3): Pt will initiate stair navigation  Skilled Therapeutic Interventions/Progress Updates:    Pt received supine in bed and agreeable to therapy session. Donned B LE thigh high TED hose, R LE ankle brace, and B LE socks max assist for time management. Supine>sitting R EOB with min/mod assist for R hemibody management and trunk upright. Through questioning pt able to report she needs to use bathroom. R squat pivot to w/c with mod assist for lifting/pivoting hips and cuing for anterior trunk lean and manual facilitation for R LE positioning and weightbearing. Transported in/out of bathroom in w/c. Stand pivot w/c<>toilet using L UE support on grab bar with heavy mod assist of 1 - manual facilitation for R/L lateral weight shifting onto stance limb, assist for R LE stepping, and manual facilitation to block R knee buckle. Continent of bladder. Standing with mod assist completed peri-care using L UE with set-up assist and pulled up pants max assist.  Transported to/from gym in w/c for time management and energy conservation. Sit>stands from TIS w/c pushing up on armrests, no AD, with min assist and manual facilitation for R LE weightbearing and quad/glute muscle activation. Gait training via 3 Musketeer support 70ft, 67ft with heavy mod assist of 1 and min assist of 2nd [place] and +3 assist for w/c follow - continues to require mod assist for R LE advancement and repositioning 100% of the time during swing phase as pt unable to step forward far  enough and requires light mod assist for R LE hip/knee extension during stance phase - when therapist facilitates close to full RLE extension during stance pt either has increased R foot/ankle pain with weightbearing or feels unstable with longer R LE stance time but difficult to determine due to pt's aphasia. Transported back to room in w/c and left seated tilted back in TIS w/c with needs in reach and seat belt alarm on.  Therapy Documentation Precautions:  Precautions Precautions: Fall Precaution Comments: R hemiparesis, R inattention Restrictions Weight Bearing Restrictions: No  Pain: Slightly less sensitivity today in RLE when donning TED hose compared to yesterday but potentially more pain in R foot/ankle during longer R LE stance time in gait with increased ankle DF during terminal stance but difficulty to fully determine due to pt's aphasia.   Therapy/Group: Individual Therapy  Tawana Scale, PT, DPT 01/21/2020, 7:56 AM

## 2020-01-22 ENCOUNTER — Inpatient Hospital Stay (HOSPITAL_COMMUNITY): Payer: 59

## 2020-01-22 LAB — GLUCOSE, CAPILLARY
Glucose-Capillary: 207 mg/dL — ABNORMAL HIGH (ref 70–99)
Glucose-Capillary: 253 mg/dL — ABNORMAL HIGH (ref 70–99)
Glucose-Capillary: 362 mg/dL — ABNORMAL HIGH (ref 70–99)
Glucose-Capillary: 303 mg/dL — ABNORMAL HIGH (ref 70–99)

## 2020-01-22 MED ORDER — INSULIN GLARGINE 100 UNIT/ML ~~LOC~~ SOLN
71.0000 [IU] | Freq: Every day | SUBCUTANEOUS | Status: DC
  Administered 2020-01-22: 71 [IU] via SUBCUTANEOUS
  Filled 2020-01-22 (×2): qty 0.71

## 2020-01-22 NOTE — Progress Notes (Signed)
[place] PHYSICAL MEDICINE & REHABILITATION PROGRESS NOTE   Subjective/Complaints: No issues overnight.   ROS: limited due to language/communication   Objective:   No results found. No results for input(s): WBC, HGB, HCT, PLT in the last 72 hours. Recent Labs    01/20/20 1051  NA 135  K 4.8  CL 103  CO2 23  GLUCOSE 279*  BUN 13  CREATININE 0.93  CALCIUM 9.2    Intake/Output Summary (Last 24 hours) at 01/22/2020 1702 Last data filed at 01/21/2020 1819 Gross per 24 hour  Intake 400 ml  Output --  Net 400 ml     Physical Exam: Vital Signs Blood pressure 116/62, pulse 81, temperature 98.9 F (37.2 C), resp. rate 18, weight 94.1 kg, last menstrual period 08/21/2011, SpO2 99 %.   General: Alert and oriented x 3, No apparent distress HEENT: Head is normocephalic, atraumatic, PERRLA, EOMI, sclera anicteric, oral mucosa pink and moist, dentition intact, ext ear canals clear,  Neck: Supple without JVD or lymphadenopathy Heart: Reg rate and rhythm. No murmurs rubs or gallops Chest: CTA bilaterally without wheezes, rales, or rhonchi; no distress Abdomen: Soft, non-tender, non-distended, bowel sounds positive. Extremities: No clubbing, cyanosis, or edema. Pulses are 2+ Skin: Clean and intact without signs of breakdown  Neurologic: awake, aphasic, language limited, inconsistent. Cranial nerves II through XII intact, motor strength is grossly 4- 5/5 in left . 3-/5 RIght  deltoid, bicep, tricep, grip, 5/5 left  0/5 right hip flexor, knee extensors, ankle dorsiflexor and plantar flexor,2- right hip ext Sensory withdraws to pain L>R     Musculoskeletal:wearing ASO   Assessment/Plan: 1. Functional deficits secondary to Cardioembolic Left MCA infarct  which require 3+ hours per day of interdisciplinary therapy in a comprehensive inpatient rehab setting.  Physiatrist is providing close team supervision and 24 hour management of active medical problems listed below.  Physiatrist  and rehab team continue to assess barriers to discharge/monitor patient progress toward functional and medical goals  Care Tool:  Bathing    Body parts bathed by patient: Right arm, Chest, Abdomen, Front perineal area, Right upper leg, Left upper leg, Face, Left lower leg   Body parts bathed by helper: Right lower leg, Left arm, Buttocks     Bathing assist Assist Level: Maximal Assistance - Patient 24 - 49%     Upper Body Dressing/Undressing Upper body dressing   What is the patient wearing?: Pull over shirt    Upper body assist Assist Level: Moderate Assistance - Patient 50 - 74%    Lower Body Dressing/Undressing Lower body dressing      What is the patient wearing?: Incontinence brief, Pants     Lower body assist Assist for lower body dressing: Moderate Assistance - Patient 50 - 74%     Toileting Toileting    Toileting assist Assist for toileting: Moderate Assistance - Patient 50 - 74%     Transfers Chair/bed transfer  Transfers assist  Chair/bed transfer activity did not occur: Safety/medical concerns  Chair/bed transfer assist level: Moderate Assistance - Patient 50 - 74% (squat pivot)     Locomotion Ambulation   Ambulation assist   Ambulation activity did not occur: Safety/medical concerns  Assist level: 2 helpers (3 Musketeer mod A) Assistive device: Other (comment) (3 Musketeer) Max distance: 60ft   Walk 10 feet activity   Assist  Walk 10 feet activity did not occur: Safety/medical concerns  Assist level: 2 helpers (3 Musketeer mod A) Assistive device: Other (comment), Hand held assist (3  Musketeer)   Walk 50 feet activity   Assist Walk 50 feet with 2 turns activity did not occur:  (did not perform turns)         Walk 150 feet activity   Assist Walk 150 feet activity did not occur: Safety/medical concerns         Walk 10 feet on uneven surface  activity   Assist Walk 10 feet on uneven surfaces activity did not occur:  Safety/medical concerns         Wheelchair     Assist Will patient use wheelchair at discharge?:  (TBD)             Wheelchair 50 feet with 2 turns activity    Assist            Wheelchair 150 feet activity     Assist          Blood pressure 116/62, pulse 81, temperature 98.9 F (37.2 C), resp. rate 18, weight 94.1 kg, last menstrual period 08/21/2011, SpO2 99 %.  Medical Problem List and Plan: 1.  Right-sided hemiparesis and aphasia secondary to left MCA infarction/petechial hemorrhage with saccular aneurysm at the paraclinoid right ICA status post mechanical thrombectomy  Continue CIR , will extend stay , slow progress   WHO/PRAFO qhs  2.  Antithrombotics: -DVT/anticoagulation: Eliquis             -antiplatelet therapy: N/A 3. Pain Management: Neurontin 100 mg every 8 hours, Zanaflex 2 mg twice daily as needed  Appears controlled on 6/19             Monitor with increased exertion 4. Mood: Provide emotional support- cannot assess accurately related to severe aphasia, empirically started on citalopram for "flat affect"  Which may be MCA infarct effect              -antipsychotic agents: N/A 5. Neuropsych: This patient is not capable of making decisions on her own behalf. 6. Skin/Wound Care: Routine skin checks 7. Fluids/Electrolytes/Nutrition: Routine in and outs.  Oral intake improving 75-100% will cont megace , d/c TF, recheck BMET  ok  can d/c midline  8.  Post stroke dysphagia.    Dysphagia #1 pudding thick liquids.  Follow-up speech therapy.  Advance diet as tolerated 9.  Hypertension.  Coreg 50 mg twice daily,Bidil 20-37.5 mg 2 tabs 3 times daily            Vitals:   01/22/20 0845 01/22/20 1403  BP: 134/79 116/62  Pulse: 71 81  Resp:  18  Temp:  98.9 F (37.2 C)  SpO2:  99%   6/19: labile- continue to monitor.  10.  Atrial fibrillation.  Lanoxin 0.125 mg daily.  Cardiac rate controlled 11.  Chronic diastolic congestive heart failure.   Demadex 20 mg daily.  Monitor for any signs of fluid overload Filed Weights   01/20/20 0429 01/21/20 0512 01/22/20 0507  Weight: 96.6 kg 98.6 kg 94.1 kg   Stable weights, overall fluid intake reduced now that TF is off   6/19: weight down 12.  Diabetes mellitus with peripheral neuropathy.  Hemoglobin A1c 11.6.  NovoLog 2 units every 4 hours, Lantus insulin 60 units nightly, Glucophage 500 mg twice daily CBG (last 3)  Recent Labs    01/21/20 2123 01/22/20 0608 01/22/20 1149  GLUCAP 335* 207* 253*    Increase to 70U, intake improving , now off TF ,am CBG improved  6/19: uncontrolled: Increase to 71 units.  Monitor with increased mobility 13.  COPD with tobacco abuse.  Continue nebulizer treatments 14.  Polysubstance abuse.  Urine drug screen positive cocaine.    Counsel when appropriate 15.  Hyperlipidemia.  Crestor 16.  GERD.  Protonix 17.  AKI Creat ok mainly prerenal azotemia with elevated BUN - no need to hold metformin at this time  18. RIght  ATFL sprain, order ankle splint, may use Ice , Remove at noc  19.  Urinary retention may have diabetic cystopathy  -appears improved with urecholine--continue, Off FLomax due to orthostatic hypotension, caths only at night discussed with nsg needs to try voiding on toilet or BSC at noc to avoid caths    LOS: 17 days A FACE TO FACE EVALUATION WAS PERFORMED  Martha Clan P Hamp [place] 01/22/2020, 5:02 PM

## 2020-01-22 NOTE — Progress Notes (Signed)
This nurse placed [place] on patient at hs. Patient wore PRAFO for an hour then complained and requested it be removed. This nurse educated patient on purpose/importance of PRAFO but patient insisted it be removed. Patient content with WHO placement and agreed to leave it on.

## 2020-01-22 NOTE — Progress Notes (Signed)
Occupational Therapy Session Note  Patient Details  Name: Robin [name] MRN: [id number] Date of Birth: Dec 01, 1965  Today's Date: 01/22/2020 OT Individual Time: 1300-1310 OT Individual Time Calculation (min): 10 min    Short Term Goals: Week 3:  OT Short Term Goal 1 (Week 3): Pt will donn a pullover shirt with no more than min assist and min demonstrational cueing following hemi dressing techniques. OT Short Term Goal 2 (Week 3): Pt will use the RUE as a gross assist with selfcare tasks and mod facilitation. OT Short Term Goal 3 (Week 3): Pt will complete toilet transfers with mod assist stand pivot for 3 consecutive sessions. OT Short Term Goal 4 (Week 3): Pt will complete LB dressing sit to stand with min assist excluding TEDs.  Skilled Therapeutic Interventions/Progress Updates:    1;1. Pt and visitor present duirng session. Pt refusing to get OOB. OT educates on role/purpose of tx to improve participation in ADLs and independence. Pt continues to refuse all tx offers OOB, EOB, ADL and bed level. Daughter called and pt continues to refuse all tx. Educated that pt will need to participate in order to improve and pt begins singing and ignoring OT. Exited session with pt missing 50 min skilled OT d/t refusal with pt in bed, exit alram on an call light in reach  Therapy Documentation Precautions:  Precautions Precautions: Fall Precaution Comments: R hemiparesis, R inattention Restrictions Weight Bearing Restrictions: No General: General OT Amount of Missed Time: 50 Minutes Vital Signs:   Pain: Pain Assessment Pain Scale: 0-10 Pain Score: 0-No pain ADL: ADL Grooming: Maximal assistance Where Assessed-Grooming: Wheelchair Upper Body Bathing: Maximal assistance Where Assessed-Upper Body Bathing: Wheelchair Lower Body Bathing: Other (comment) (total +2) Where Assessed-Lower Body Bathing: Other (Comment), Wheelchair Upper Body Dressing: Dependent Where Assessed-Upper Body  Dressing: Wheelchair, Sitting at sink Lower Body Dressing: Other (Comment) (total +2) Where Assessed-Lower Body Dressing: Wheelchair, Sitting at sink, Standing at sink Toileting: Other (Comment) (total +2) Where Assessed-Toileting: Bedside Commode Toilet Transfer: Other (comment) (total +2) Toilet Transfer Method: Other (comment) (Stedy +2 assist) Vision   Perception    Praxis   Exercises:   Other Treatments:     Therapy/Group: Individual Therapy  Tonny Branch 01/22/2020, 1:12 PM

## 2020-01-23 LAB — GLUCOSE, CAPILLARY
Glucose-Capillary: 226 mg/dL — ABNORMAL HIGH (ref 70–99)
Glucose-Capillary: 214 mg/dL — ABNORMAL HIGH (ref 70–99)
Glucose-Capillary: 272 mg/dL — ABNORMAL HIGH (ref 70–99)
Glucose-Capillary: 281 mg/dL — ABNORMAL HIGH (ref 70–99)

## 2020-01-23 MED ORDER — INSULIN GLARGINE 100 UNIT/ML ~~LOC~~ SOLN
72.0000 [IU] | Freq: Every day | SUBCUTANEOUS | Status: DC
  Administered 2020-01-23 – 2020-02-01 (×10): 72 [IU] via SUBCUTANEOUS
  Filled 2020-01-23 (×12): qty 0.72

## 2020-01-23 NOTE — Progress Notes (Signed)
[place] PHYSICAL MEDICINE & REHABILITATION PROGRESS NOTE   Subjective/Complaints: No complaints. Wore PRAFO for 1 hour last night but found it uncomfortable. Was willing to wear WHO.   ROS: limited due to language/communication   Objective:   No results found. No results for input(s): WBC, HGB, HCT, PLT in the last 72 hours. No results for input(s): NA, K, CL, CO2, GLUCOSE, BUN, CREATININE, CALCIUM in the last 72 hours.  Intake/Output Summary (Last 24 hours) at 01/23/2020 1305 Last data filed at 01/23/2020 1125 Gross per 24 hour  Intake 360 ml  Output --  Net 360 ml     Physical Exam: Vital Signs Blood pressure 128/70, pulse 79, temperature 98.2 F (36.8 C), resp. rate 16, weight 96.3 kg, last menstrual period 08/21/2011, SpO2 100 %. General: Alert and oriented x 3, No apparent distress HEENT: Head is normocephalic, atraumatic, PERRLA, EOMI, sclera anicteric, oral mucosa pink and moist, dentition intact, ext ear canals clear,  Neck: Supple without JVD or lymphadenopathy Heart: Reg rate and rhythm. No murmurs rubs or gallops Chest: CTA bilaterally without wheezes, rales, or rhonchi; no distress Abdomen: Soft, non-tender, non-distended, bowel sounds positive. Extremities: No clubbing, cyanosis, or edema. Pulses are 2+ Neurologic: awake, aphasic, language limited, inconsistent. Cranial nerves II through XII intact, motor strength is grossly 4- 5/5 in left . 3-/5 RIght  deltoid, bicep, tricep, grip, 5/5 left  0/5 right hip flexor, knee extensors, ankle dorsiflexor and plantar flexor,2- right hip ext Sensory withdraws to pain L>R     Musculoskeletal:wearing ASO   Assessment/Plan: 1. Functional deficits secondary to Cardioembolic Left MCA infarct  which require 3+ hours per day of interdisciplinary therapy in a comprehensive inpatient rehab setting.  Physiatrist is providing close team supervision and 24 hour management of active medical problems listed below.  Physiatrist and  rehab team continue to assess barriers to discharge/monitor patient progress toward functional and medical goals  Care Tool:  Bathing    Body parts bathed by patient: Right arm, Chest, Abdomen, Front perineal area, Right upper leg, Left upper leg, Face, Left lower leg   Body parts bathed by helper: Right lower leg, Left arm, Buttocks     Bathing assist Assist Level: Maximal Assistance - Patient 24 - 49%     Upper Body Dressing/Undressing Upper body dressing   What is the patient wearing?: Pull over shirt    Upper body assist Assist Level: Moderate Assistance - Patient 50 - 74%    Lower Body Dressing/Undressing Lower body dressing      What is the patient wearing?: Incontinence brief, Pants     Lower body assist Assist for lower body dressing: Moderate Assistance - Patient 50 - 74%     Toileting Toileting    Toileting assist Assist for toileting: Moderate Assistance - Patient 50 - 74%     Transfers Chair/bed transfer  Transfers assist  Chair/bed transfer activity did not occur: Safety/medical concerns  Chair/bed transfer assist level: Moderate Assistance - Patient 50 - 74% (squat pivot)     Locomotion Ambulation   Ambulation assist   Ambulation activity did not occur: Safety/medical concerns  Assist level: 2 helpers (3 Musketeer mod A) Assistive device: Other (comment) (3 Musketeer) Max distance: 50ft   Walk 10 feet activity   Assist  Walk 10 feet activity did not occur: Safety/medical concerns  Assist level: 2 helpers (3 Musketeer mod A) Assistive device: Other (comment), Hand held assist (3 Musketeer)   Walk 50 feet activity   Assist Walk 50  feet with 2 turns activity did not occur:  (did not perform turns)         Walk 150 feet activity   Assist Walk 150 feet activity did not occur: Safety/medical concerns         Walk 10 feet on uneven surface  activity   Assist Walk 10 feet on uneven surfaces activity did not occur:  Safety/medical concerns         Wheelchair     Assist Will patient use wheelchair at discharge?:  (TBD)             Wheelchair 50 feet with 2 turns activity    Assist            Wheelchair 150 feet activity     Assist          Blood pressure 128/70, pulse 79, temperature 98.2 F (36.8 C), resp. rate 16, weight 96.3 kg, last menstrual period 08/21/2011, SpO2 100 %.  Medical Problem List and Plan: 1.  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  Antithrombotics: -DVT/anticoagulation: Eliquis             -antiplatelet therapy: N/A 3. Pain Management: Neurontin 100 mg every 8 hours, Zanaflex 2 mg twice daily as needed  Controlled 6/20.              Monitor with increased exertion 4. Mood: Provide emotional support- cannot assess accurately related to severe aphasia, empirically started on citalopram for "flat affect"  Which may be MCA infarct effect              -antipsychotic agents: N/A 5. Neuropsych: This patient is not capable of making decisions on her own behalf. 6. Skin/Wound Care: Routine skin checks 7. Fluids/Electrolytes/Nutrition: Routine in and outs.  Oral intake improving 75-100% will cont megace , d/c TF, recheck BMET  ok can d/c midline  8.  Post stroke dysphagia.    Dysphagia #1 pudding thick liquids.  Follow-up speech therapy.  Advance diet as tolerated 9.  Hypertension.  Coreg 50 mg twice daily,Bidil 20-37.5 mg 2 tabs 3 times daily            Vitals:   01/22/20 2030 01/23/20 0415  BP: 122/71 128/70  Pulse: 94 79  Resp: 18 16  Temp: 98.7 F (37.1 C) 98.2 F (36.8 C)  SpO2: 97% 100%   6/20 well controlled.   10.  Atrial fibrillation.  Lanoxin 0.125 mg daily.  Cardiac rate controlled 11.  Chronic diastolic congestive heart failure.   Demadex 20 mg daily.  Monitor for any signs of fluid overload Filed Weights   01/21/20 0512 01/22/20 0507 01/23/20 0440  Weight: 98.6 kg 94.1 kg 96.3 kg   Stable weights, overall fluid intake reduced now that TF is off   6/20: relatively stable 12.  Diabetes mellitus with peripheral neuropathy.  Hemoglobin A1c 11.6.  NovoLog 2 units every 4 hours, Lantus insulin 60 units nightly, Glucophage 500 mg twice daily CBG (last 3)  Recent Labs    01/22/20 2115 01/23/20 0611 01/23/20 1153  GLUCAP 303* 226* 214*    Increase to 70U, intake improving , now off TF ,am CBG improved  6/19: uncontrolled: Increase to 71 units.   6/20: uncontrolled: increase to 72U.  Monitor with increased mobility 13.  COPD with tobacco abuse.  Continue nebulizer treatments 14.  Polysubstance abuse.  Urine drug screen positive cocaine.    Counsel when appropriate 15.  Hyperlipidemia.  Crestor 16.  GERD.  Protonix 17.  AKI Creat ok mainly prerenal azotemia with elevated BUN - no need to hold metformin at this time  18. RIght  ATFL sprain, order ankle splint, may use Ice , Remove at noc  19.  Urinary retention may have diabetic cystopathy  -appears improved with urecholine--continue, Off FLomax due to orthostatic hypotension, caths only at night discussed with nsg needs to try voiding on toilet or BSC at noc to avoid caths    LOS: 18 days A FACE TO FACE EVALUATION WAS PERFORMED  Thurl Boen P Jinx Gilden 01/23/2020, 1:05 PM

## 2020-01-24 ENCOUNTER — Inpatient Hospital Stay (HOSPITAL_COMMUNITY): Payer: 59

## 2020-01-24 ENCOUNTER — Inpatient Hospital Stay (HOSPITAL_COMMUNITY): Payer: 59 | Admitting: Occupational Therapy

## 2020-01-24 ENCOUNTER — Inpatient Hospital Stay (HOSPITAL_COMMUNITY): Payer: 59 | Admitting: Physical Therapy

## 2020-01-24 ENCOUNTER — Inpatient Hospital Stay (HOSPITAL_COMMUNITY): Payer: 59 | Admitting: Speech Pathology

## 2020-01-24 LAB — GLUCOSE, CAPILLARY
Glucose-Capillary: 210 mg/dL — ABNORMAL HIGH (ref 70–99)
Glucose-Capillary: 203 mg/dL — ABNORMAL HIGH (ref 70–99)
Glucose-Capillary: 367 mg/dL — ABNORMAL HIGH (ref 70–99)
Glucose-Capillary: 365 mg/dL — ABNORMAL HIGH (ref 70–99)
Glucose-Capillary: 299 mg/dL — ABNORMAL HIGH (ref 70–99)

## 2020-01-24 MED ORDER — METFORMIN HCL 850 MG PO TABS
850.0000 mg | ORAL_TABLET | Freq: Two times a day (BID) | ORAL | Status: DC
  Administered 2020-01-24 – 2020-01-26 (×5): 850 mg
  Filled 2020-01-24 (×6): qty 1

## 2020-01-24 NOTE — Progress Notes (Signed)
Occupational Therapy Session Note  Patient Details  Name: Robin [name] MRN: [id number] Date of Birth: October 13, 1965  Today's Date: 01/24/2020 OT Individual Time: 5053-9767 OT Individual Time Calculation (min): 31 min    Short Term Goals: Week 3:  OT Short Term Goal 1 (Week 3): Pt will donn a pullover shirt with no more than min assist and min demonstrational cueing following hemi dressing techniques. OT Short Term Goal 2 (Week 3): Pt will use the RUE as a gross assist with selfcare tasks and mod facilitation. OT Short Term Goal 3 (Week 3): Pt will complete toilet transfers with mod assist stand pivot for 3 consecutive sessions. OT Short Term Goal 4 (Week 3): Pt will complete LB dressing sit to stand with min assist excluding TEDs.  Skilled Therapeutic Interventions/Progress Updates:    Pt up in wheelchair to start session.  Therapist took her down to the ortho gym for session with work on RUE neuromuscular re-education during session.  Therapist applied ace bandage to the RUE for use of the UE ergonometer.  She completed 1 set of 3 mins with BUE use on level 6 resistance with RPMs maintained at 26-28.  She then completed 2 sets of 2 mins with resistance on level 1 and isolated use of the RUE only.  Min facilitation was needed to make revolutions with the RUE at less than 5 RPMs.  Next, took pt back to the room and had her work on [place] for folding washcloths as well as unilateral reaching to pick up paper towels and throw them into the trash can. Mod facilitation need for completion of these tasks.  Pt was left up in the wheelchair at the end of the session with call button and phone in reach and safety belt in place.     Therapy Documentation Precautions:  Precautions Precautions: Fall Precaution Comments: R hemiparesis, R inattention Restrictions Weight Bearing Restrictions: No  Pain: Pain Assessment Pain Scale: Faces Pain Score: 0-No pain   Therapy/Group: Individual  Therapy  Voncile Schwarz OTR/L 01/24/2020, 2:24 PM

## 2020-01-24 NOTE — Progress Notes (Signed)
Spoke with patients husband, Staisha Winiarski, today regarding visitation mishap.  He explained that his sister was a patient in the hospital and had surgery and thought it was ok to visit while she was here.  He further explained that he had no idea she came until afterwards when the designated visitors (the patients sister and daughter) were asked to show IDs at the front desk.  He also says his daughter Edmon Crape [facility]) was having a hard time getting allowed to come up after they started to ask for identification because they only had her listed in the chart by her nickname, not by what was on her license.    Husband asked if there could be a 1 time exception to the visitation policy so he can come and visit his wife who he hasnt seen in [place] since she was hospitalized.  He says he chose Nancys sister to be the visitor because she understands more about health care related subjects than he does.  Spoke with Verdis Frederickson, nursing supervisor who approved for Barbaraann Rondo to come and see his wife on 1 occasion only.  Will update front desk staff.  Brita Romp, RN

## 2020-01-24 NOTE — Progress Notes (Signed)
Physical Therapy Session Note  Patient Details  Name: Robin [name] MRN: [id number] Date of Birth: 01/04/1966  Today's Date: 01/24/2020 PT Individual Time: 0800-0900 PT Individual Time Calculation (min): 60 min   Short Term Goals:  Week 3:  PT Short Term Goal 1 (Week 3): Pt will perform supine<>sit with min assist PT Short Term Goal 2 (Week 3): Pt will ambulate at least 77ft using LRAD with mod assist of 1 and +2 w/c follow if needed PT Short Term Goal 3 (Week 3): Pt will initiate stair navigation  Skilled Therapeutic Interventions/Progress Updates:  Pt resting in bed.  She denied pain.  PT donned thigh high TEDS.  She assisted iwht donning pants in supine.  Rolling L with close supervision ; sitting up with CGA/min assist.  Pt doffed night shirt and donned shirt in sitting with min assist.  PT donned L shoe and R non slip sock. Squat/lateral scoot to R with min assist, multimodal cues.    Pt reported needing to use toilet.  Stand pivot w/c> toilet with mod assist, using wall rail. Clothing mgt with mod assist.  Pt continent of urine.  +2 for doffing/donning brief and pants with pt in standing.  neuromuscular re-education via forced use, multimodal cues for RLE mass extension, with use of Kinetron, pt seated in w/c, using RLE only; PT operating other footplate, at level 50 cm/sec x 20 cycles x 2.  Gait training with use of high sturdy table on pt's L, L hand on table, x 20' with mod/max assist and w/c follow for safety.  Pt progressed RLE without assistance 80% of the time; PT provided R stance stability. Gait using wall railing x 20' as above.  At end of session, pt seated in wc with seat belt alarm set and needs at hand; pillow under RUE for support.     Therapy Documentation Precautions:  Precautions Precautions: Fall Precaution Comments: R hemiparesis, R inattention Restrictions Weight Bearing Restrictions: No     Therapy/Group: Individual Therapy  Kirstin Kugler 01/24/2020,  10:20 AM

## 2020-01-24 NOTE — Progress Notes (Signed)
Speech Language Pathology Daily Session Note  Patient Details  Name: Makaela Cando Ratledge MRN: [id number] Date of Birth: 1966-01-08  Today's Date: 01/24/2020 SLP Individual Time: 6314-9702 SLP Individual Time Calculation (min): 41 min  Short Term Goals: Week 3: SLP Short Term Goal 1 (Week 3): Pt will consume current diet with minimal overt s/sx aspiration and efficient mastication and oral clearance with Supervision A for use of safe swallow strategies. SLP Short Term Goal 2 (Week 3): Pt will verbally name common objects with 25% accuracy provided Max A multimodal cues. SLP Short Term Goal 3 (Week 3): Pt will imitate at the phoneme level with 50% accuracy provided Max A multimodal cues. SLP Short Term Goal 4 (Week 3): Pt will use communication board to express basic wants and needs with Min A multimodal cues. SLP Short Term Goal 5 (Week 3): Pt will sustain attention to functional tasks for 10 minute intervals with Min A cues for redirection. SLP Short Term Goal 6 (Week 3): Pt will detect functional errors in 50% of opportunities with Mod A verbal and visual cues cues.  Skilled Therapeutic Interventions: Pt was seen for skilled ST targeting communication. Upon arrival, pt recognized therapist as ST - she pulled out communication board and said, "okay let's go." Her affect was brighter and she was very engaged. She was aware of ~60% of verbal errors today, and slight increase in automatic words/phrases noted, although not always appropriate. Pt also communicated that use of picture communication board has not been successful. She was ~70% accurate in identifying fucntional needs/wants with use of communication board in practice today. Max  A orthographic and semantic cues provided for functional verbalizing at the word level - pt with XX out of XX accuracy. Pt communicated via gesture that she would like to get back into bed during session; SLP encouraged her to wait until end of session, which she did. She  also recalled this and gestured again at end of session with only Min A for clarification of gestures. Pt left sitting in chair with alarm set and needs within reach, NT aware of pt's request to get back into bed. Continue per current plan of care.        Pain Pain Assessment Pain Scale: 0-10 Pain Score: 0-No pain  Therapy/Group: Individual Therapy  Arbutus Leas 01/24/2020, 7:18 AM

## 2020-01-24 NOTE — Progress Notes (Signed)
[facility] PHYSICAL MEDICINE & REHABILITATION PROGRESS NOTE   Subjective/Complaints:   Remains aphasic but able to repeat good morning with phonemic errors   ROS: limited due to language/communication   Objective:   No results found. No results for input(s): WBC, HGB, HCT, PLT in the last 72 hours. No results for input(s): NA, K, CL, CO2, GLUCOSE, BUN, CREATININE, CALCIUM in the last 72 hours.  Intake/Output Summary (Last 24 hours) at 01/24/2020 0758 Last data filed at 01/24/2020 0736 Gross per 24 hour  Intake 684 ml  Output --  Net 684 ml     Physical Exam: Vital Signs Blood pressure (!) 148/86, pulse 85, temperature 98.8 F (37.1 C), resp. rate 16, weight 96.3 kg, last menstrual period 08/21/2011, SpO2 100 %.  General: No acute distress Mood and affect are appropriate Heart: Regular rate and rhythm no rubs murmurs or extra sounds Lungs: Clear to auscultation, breathing unlabored, no rales or wheezes Abdomen: Positive bowel sounds, soft nontender to palpation, nondistended Extremities: No clubbing, cyanosis, or edema Skin: No evidence of breakdown, no evidence of rash Neurologic: awake, aphasic, language limited, inconsistent. Cranial nerves II through XII intact, motor strength is grossly 4- 5/5 in left . 3-/5 RIght  deltoid, bicep, tricep, grip, 5/5 left  0/5 right hip flexor, knee extensors, ankle dorsiflexor and plantar flexor,2- right hip ext Sensory withdraws to pain L>R     Musculoskeletal:wearing ASO   Assessment/Plan: 1. Functional deficits secondary to Cardioembolic Left MCA infarct  which require 3+ hours per day of interdisciplinary therapy in a comprehensive inpatient rehab setting.  Physiatrist is providing close team supervision and 24 hour management of active medical problems listed below.  Physiatrist and rehab team continue to assess barriers to discharge/monitor patient progress toward functional and medical goals  Care Tool:  Bathing    Body  parts bathed by patient: Right arm, Chest, Abdomen, Front perineal area, Right upper leg, Left upper leg, Face, Left lower leg   Body parts bathed by helper: Right lower leg, Left arm, Buttocks     Bathing assist Assist Level: Maximal Assistance - Patient 24 - 49%     Upper Body Dressing/Undressing Upper body dressing   What is the patient wearing?: Pull over shirt    Upper body assist Assist Level: Moderate Assistance - Patient 50 - 74%    Lower Body Dressing/Undressing Lower body dressing      What is the patient wearing?: Incontinence brief, Pants     Lower body assist Assist for lower body dressing: Moderate Assistance - Patient 50 - 74%     Toileting Toileting    Toileting assist Assist for toileting: Moderate Assistance - Patient 50 - 74%     Transfers Chair/bed transfer  Transfers assist  Chair/bed transfer activity did not occur: Safety/medical concerns  Chair/bed transfer assist level: Moderate Assistance - Patient 50 - 74% (squat pivot)     Locomotion Ambulation   Ambulation assist   Ambulation activity did not occur: Safety/medical concerns  Assist level: 2 helpers (3 Musketeer mod A) Assistive device: Other (comment) (3 Musketeer) Max distance: 79ft   Walk 10 feet activity   Assist  Walk 10 feet activity did not occur: Safety/medical concerns  Assist level: 2 helpers (3 Musketeer mod A) Assistive device: Other (comment), Hand held assist (3 Musketeer)   Walk 50 feet activity   Assist Walk 50 feet with 2 turns activity did not occur:  (did not perform turns)  Walk 150 feet activity   Assist Walk 150 feet activity did not occur: Safety/medical concerns         Walk 10 feet on uneven surface  activity   Assist Walk 10 feet on uneven surfaces activity did not occur: Safety/medical concerns         Wheelchair     Assist Will patient use wheelchair at discharge?:  (TBD)             Wheelchair 50 feet  with 2 turns activity    Assist            Wheelchair 150 feet activity     Assist          Blood pressure (!) 148/86, pulse 85, temperature 98.8 F (37.1 C), resp. rate 16, weight 96.3 kg, last menstrual period 08/21/2011, SpO2 100 %.  Medical Problem List and Plan: 1.  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  Antithrombotics: -DVT/anticoagulation: Eliquis             -antiplatelet therapy: N/A 3. Pain Management: Neurontin 100 mg every 8 hours, Zanaflex 2 mg twice daily as needed  Controlled 6/20.              Monitor with increased exertion 4. Mood: Provide emotional support- cannot assess accurately related to severe aphasia, empirically started on citalopram for "flat affect"  Which may be MCA infarct effect              -antipsychotic agents: N/A 5. Neuropsych: This patient is not capable of making decisions on her own behalf. 6. Skin/Wound Care: Routine skin checks 7. Fluids/Electrolytes/Nutrition: Routine in and outs.  Oral intake improving 75-100% will DC megace , still needs enc to drink fluids  8.  Post stroke dysphagia.    Dysphagia #1 pudding thick liquids.  Follow-up speech therapy.  Advance diet as tolerated 9.  Hypertension.  Coreg 50 mg twice daily,Bidil 20-37.5 mg 2 tabs 3 times daily            Vitals:   01/23/20 1949 01/24/20 0337  BP: (!) 152/66 (!) 148/86  Pulse: 91 85  Resp: 18 16  Temp: 98 F (36.7 C) 98.8 F (37.1 C)  SpO2: 98% 100%   6/21 mild sys elevation monitor   10.  Atrial fibrillation.  Lanoxin 0.125 mg daily.  Cardiac rate controlled 11.  Chronic diastolic congestive heart failure.  Demadex 20 mg daily.  Monitor for any signs of fluid overload Filed Weights   01/22/20 0507 01/23/20 0440 01/24/20 0510  Weight: 94.1 kg 96.3  kg 96.3 kg   Stable weights, overall fluid intake reduced now that TF is off   6/21: relatively stable 12.  Diabetes mellitus with peripheral neuropathy.  Hemoglobin A1c 11.6.  NovoLog 2 units every 4 hours, Lantus insulin 60 units nightly, Glucophage 500 mg twice daily CBG (last 3)  Recent Labs    01/23/20 1705 01/23/20 2052 01/24/20 0610  GLUCAP 272* 281* 210*    Increase to 70U, intake improving , now off TF ,am CBG improved  6/19: uncontrolled: Increase to 71 units.   6/20: uncontrolled: increase to 72U.               Monitor with increased mobility 13.  COPD with tobacco abuse.  Continue nebulizer treatments 14.  Polysubstance abuse.  Urine drug screen positive cocaine.    Counsel when appropriate 15.  Hyperlipidemia.  Crestor 16.  GERD.  Protonix 17.  AKI Creat ok mainly prerenal azotemia with elevated BUN - no need to hold metformin at this time  18. RIght  ATFL sprain, order ankle splint, may use Ice , Remove at noc  19.  Urinary retention may have diabetic cystopathy  -appears improved with urecholine--continue, Off FLomax due to orthostatic hypotension, caths only at night discussed with nsg needs to try voiding on toilet or BSC at noc to avoid caths    LOS: 19 days A FACE TO Elizabeth E Joal Eakle 01/24/2020, 7:58 AM

## 2020-01-24 NOTE — Progress Notes (Signed)
[facility] information desk called this floor due to pt's daughter wanting drop-off food (ice cream). [name] daughter however did not have her ID on her, per the caller at the information desk. [name] chart was checked and states that IDs are neccessary to visit pt due to instances of having non-designated visitors on the unit.   NT alerted CN that pt's spouse is on the phone and had a c/o about his daughter not being allowed onto unit. Per spouse, daughter did have her ID on her. However, since it says her birth name on the ID, and in the chart for designated visitors it's her nickname, she was not allowed onto floor. Spouse also did not see why the daughter wasn't allowed since she has been on the floor before, and staff should be able to recognize her by now. It was explained to spouse that facial recognition isn't an reliable method, and there may be staff on floor who have never interacted with the daughter.    Charge went down to information desk to pick up food item, however no one was at the desk at the time. Will address allowed visitors in morning.

## 2020-01-24 NOTE — Progress Notes (Signed)
Occupational Therapy Session Note  Patient Details  Name: Robin [name] MRN: [id number] Date of Birth: 1966/01/19  Today's Date: 01/24/2020 OT Individual Time: 0600-4599 OT Individual Time Calculation (min): 55 min    Short Term Goals: Week 3:  OT Short Term Goal 1 (Week 3): Pt will donn a pullover shirt with no more than min assist and min demonstrational cueing following hemi dressing techniques. OT Short Term Goal 2 (Week 3): Pt will use the RUE as a gross assist with selfcare tasks and mod facilitation. OT Short Term Goal 3 (Week 3): Pt will complete toilet transfers with mod assist stand pivot for 3 consecutive sessions. OT Short Term Goal 4 (Week 3): Pt will complete LB dressing sit to stand with min assist excluding TEDs.  Skilled Therapeutic Interventions/Progress Updates:    Pt worked on bathing and dressing during session.  Mod assist for stand pivot transfer into and out of the shower.  She was able to complete sit to stand throughout session as well during LB bathing and dressing with min assist, but needed mod assist for balance secondary to RLE weakness when washing peri area and when pulling garments over hips.  Max instructional cueing for hemidressing techniques to donn a pullover shirt, with mod instructional cueing for donning LB clothing following the techniques.  She was able to use and integrate the RUE throughout session with mod assist for washing the LUE.  Min assist for using it to stabilize objects such as the deodorant and the toothpaste to open.  Finished session up in the tilt in space wheelchair with safety belt in place and call button and phone in reach.    Therapy Documentation Precautions:  Precautions Precautions: Fall Precaution Comments: R hemiparesis, R inattention Restrictions Weight Bearing Restrictions: No  Pain: Pain Assessment Pain Scale: Faces Pain Score: 0-No pain ADL: See Care Tool Section for some details of mobility and  selfcare  Therapy/Group: Individual Therapy  Laurann Mcmorris OTR/L 01/24/2020, 9:57 AM

## 2020-01-24 NOTE — Plan of Care (Signed)
  Problem: Consults Goal: RH STROKE PATIENT EDUCATION Description: See Patient Education module for education specifics  Outcome: Progressing Goal: Diabetes Guidelines if Diabetic/Glucose > 140 Description: If diabetic or lab glucose is > 140 mg/dl - Initiate Diabetes/Hyperglycemia Guidelines & Document Interventions  Outcome: Progressing   Problem: RH BOWEL ELIMINATION Goal: RH STG MANAGE BOWEL WITH ASSISTANCE Description: STG Manage Bowel with  mod  Assistance. Outcome: Progressing Goal: RH STG MANAGE BOWEL W/MEDICATION W/ASSISTANCE Description: STG Manage Bowel with Medication with mod Assistance. Outcome: Progressing   Problem: RH BLADDER ELIMINATION Goal: RH STG MANAGE BLADDER WITH ASSISTANCE Description: STG Manage Bladder With mod Assistance Outcome: Progressing   Problem: RH SKIN INTEGRITY Goal: RH STG SKIN FREE OF INFECTION/BREAKDOWN Description: Patients skin will remain free from further infection or breakdown with mod assist. Outcome: Progressing Goal: RH STG MAINTAIN SKIN INTEGRITY WITH ASSISTANCE Description: STG Maintain Skin Integrity With mod Assistance. Outcome: Progressing Goal: RH STG ABLE TO PERFORM INCISION/WOUND CARE W/ASSISTANCE Description: STG Able To Perform Incision/Wound Care With mod Assistance. Outcome: Progressing   Problem: RH SAFETY Goal: RH STG ADHERE TO SAFETY PRECAUTIONS W/ASSISTANCE/DEVICE Description: STG Adhere to Safety Precautions With min Assistance/Device. Outcome: Progressing   Problem: RH COGNITION-NURSING Goal: RH STG USES MEMORY AIDS/STRATEGIES W/ASSIST TO PROBLEM SOLVE Description: STG Uses Memory Aids/Strategies With min Assistance to Problem Solve. Outcome: Progressing   Problem: RH PAIN MANAGEMENT Goal: RH STG PAIN MANAGED AT OR BELOW [name] PAIN GOAL Description: < 3 Outcome: Progressing   Problem: RH KNOWLEDGE DEFICIT Goal: RH STG INCREASE KNOWLEDGE OF DIABETES Description: Patient/caregiver will verbalize  understanding of the management of DM including diet, exercise, medications, monitoring with min assist. Outcome: Progressing Goal: RH STG INCREASE KNOWLEDGE OF HYPERTENSION Description: Patient/caregiver will verbalize understanding of the management of HTN including diet, exercise, medications, monitoring with min assist. Outcome: Progressing Goal: RH STG INCREASE KNOWLEDGE OF DYSPHAGIA/FLUID INTAKE Description: Patient/caregiver will verbalize understanding of the management of dysphagia including diet, medications, and swallowing strategies as outlined by SLP with min assist. Outcome: Progressing Goal: RH STG INCREASE KNOWLEGDE OF HYPERLIPIDEMIA Description: Patient/caregiver will verbalize understanding of the management of HLD including diet, exercise, medications, monitoring with min assist. Outcome: Progressing Goal: RH STG INCREASE KNOWLEDGE OF STROKE PROPHYLAXIS Description: Patient/caregiver will verbalize understanding of the management of stroke prophylaxis including diet, exercise, medications, monitoring with min assist. Outcome: Progressing

## 2020-01-25 ENCOUNTER — Inpatient Hospital Stay (HOSPITAL_COMMUNITY): Payer: 59 | Admitting: Occupational Therapy

## 2020-01-25 ENCOUNTER — Inpatient Hospital Stay (HOSPITAL_COMMUNITY): Payer: 59 | Admitting: Speech Pathology

## 2020-01-25 ENCOUNTER — Inpatient Hospital Stay (HOSPITAL_COMMUNITY): Payer: 59 | Admitting: Physical Therapy

## 2020-01-25 LAB — GLUCOSE, CAPILLARY
Glucose-Capillary: 180 mg/dL — ABNORMAL HIGH (ref 70–99)
Glucose-Capillary: 176 mg/dL — ABNORMAL HIGH (ref 70–99)
Glucose-Capillary: 246 mg/dL — ABNORMAL HIGH (ref 70–99)
Glucose-Capillary: 329 mg/dL — ABNORMAL HIGH (ref 70–99)

## 2020-01-25 MED ORDER — BETHANECHOL CHLORIDE 10 MG PO TABS
10.0000 mg | ORAL_TABLET | Freq: Two times a day (BID) | ORAL | Status: DC
  Administered 2020-01-25 – 2020-01-26 (×4): 10 mg
  Filled 2020-01-25 (×4): qty 1

## 2020-01-25 NOTE — Progress Notes (Signed)
Physical Therapy Session Note  Patient Details  Name: Robin [name] MRN: [id number] Date of Birth: Mar 31, 1966  Today's Date: 01/25/2020 PT Individual Time: 0932-1033 PT Individual Time Calculation (min): 61 min   Short Term Goals: Week 3:  PT Short Term Goal 1 (Week 3): Pt will perform supine<>sit with min assist PT Short Term Goal 2 (Week 3): Pt will ambulate at least 38ft using LRAD with mod assist of 1 and +2 w/c follow if needed PT Short Term Goal 3 (Week 3): Pt will initiate stair navigation  Skilled Therapeutic Interventions/Progress Updates:   Pt received supine in bed and agreeable to therapy session. Supine>sitting R EOB with min assist for trunk upright via L HHA as she reached out for therapist support. Sitting EOB with supervision for trunk control, pt able to lean backwards without LOB, while therapist donned B LE TEDs, R ankle brace, thread pants, and don socks with max assist - pt continues to show R ankle/foot sensitivity to touch while therapist is donning TEDs. Sit>stand EOM>no UE support with min assist and pulled pants over hips with mod assist for R side. L stand pivot to w/c with min/mod assist for balance. Deatra Ina, ATP, present to discuss patients wheelchair needs upon discharge as anticipate this will provide her more independence with mobility in the home and decrease caregiver burden. Discussed that patient has been in [place] wheelchair due to orthostatic hypotension and early on not being able to weight shift for proper pressure relief - planning to trial non tilt wheelchair tomorrow. Patient, therapist, and ATP discussed that patient will need a light weight chair to allow more independent propulsion while providing energy conservation but no significant concerns about pressure wounds. Will update patients family on the reocmmendaiton as well.  Transported to/from gym in w/c for time management and energy conservation. Gait training ~167ft with L HHA and R UE over  therapist's shoulder with mod assist of 1 and +2 min assist for balance - pt demonstrates ability to advance R LE during swing >80% of the time but lacks foot clearance (inadequate hip/knee flexion) and continues to have excessive hip external rotation and hip adduction - progressing towards reciprocal stepping pattern with increased R LE stance time - only min guarding for R knee position during stance phase. Therapist provided patient with RW and R hand orthotic and educated patient on use. Gait training ~19ft, ~159ft (seated break between) using RW with mod assist of 1 and +2 w/c follow - pt continues to demonstrate above gait impairments - during 2nd walk donned orange level 2 theraband strap to R LE in criss-cross pattern for NMR to promote increased hip/knee flexion during swing and more neutral hip alignment with improvements noted in all areas except for continued excessive hip adduction. Transported back to room and left seated in TIS w/c with needs in reach and seat belt alarm on.  Therapist spoke with patient's sister, [name], who reports that the family is still working on arranging 24hr support at home. She also reports patient will be discharging to the pt's daughter's home that has 3 STE without handrails. Discussed pt's CLOF and the need for a ramp for the stairs; however, patient's daughter rents the home so unsure of possible modifications at this time. [name] sister will follow-up with Margreta Journey, SW.   Therapy Documentation Precautions:  Precautions Precautions: Fall Precaution Comments: R hemiparesis, R inattention Restrictions Weight Bearing Restrictions: No  Pain:   Continues to demonstrate some discomfort when therapist donning R LE  TED hose and ankle brace but otherwise no complaints or indications of pain during session (Pt tolerating longer R LE stance times with progression towards reciprocal pattern during gait training)   Therapy/Group: Individual Therapy  Tawana Scale,  PT, DPT 01/25/2020, 7:57 AM

## 2020-01-25 NOTE — Progress Notes (Signed)
[place] PHYSICAL MEDICINE & REHABILITATION PROGRESS NOTE   Subjective/Complaints:  No issues overnite , discussed meds with nsg, off TF no NGT, will try to d/c protonix, last K high normal will d/c and monitor    ROS: limited due to language/communication   Objective:   No results found. No results for input(s): WBC, HGB, HCT, PLT in the last 72 hours. No results for input(s): NA, K, CL, CO2, GLUCOSE, BUN, CREATININE, CALCIUM in the last 72 hours.  Intake/Output Summary (Last 24 hours) at 01/25/2020 0811 Last data filed at 01/24/2020 1825 Gross per 24 hour  Intake 222 ml  Output --  Net 222 ml     Physical Exam: Vital Signs Blood pressure 127/82, pulse 98, temperature 97.8 F (36.6 C), resp. rate 18, weight 96.2 kg, last menstrual period 08/21/2011, SpO2 99 %.   General: No acute distress Mood and affect are appropriate Heart: Regular rate and rhythm no rubs murmurs or extra sounds Lungs: Clear to auscultation, breathing unlabored, no rales or wheezes Abdomen: Positive bowel sounds, soft nontender to palpation, nondistended Extremities: No clubbing, cyanosis, or edema Skin: No evidence of breakdown, no evidence of rash   intact, motor strength is grossly 4- 5/5 in left . 3-/5 RIght  deltoid, bicep, tricep, grip, 5/5 left  0/5 right hip flexor, knee extensors, ankle dorsiflexor and plantar flexor,2- right hip ext Sensory withdraws to pain L>R     Musculoskeletal:wearing ASO   Assessment/Plan: 1. Functional deficits secondary to Cardioembolic Left MCA infarct  which require 3+ hours per day of interdisciplinary therapy in a comprehensive inpatient rehab setting.  Physiatrist is providing close team supervision and 24 hour management of active medical problems listed below.  Physiatrist and rehab team continue to assess barriers to discharge/monitor patient progress toward functional and medical goals  Care Tool:  Bathing    Body parts bathed by patient: Right arm,  Chest, Abdomen, Front perineal area, Right upper leg, Left upper leg, Face, Left lower leg, Right lower leg   Body parts bathed by helper: Left arm, Buttocks     Bathing assist Assist Level: Moderate Assistance - Patient 50 - 74%     Upper Body Dressing/Undressing Upper body dressing   What is the patient wearing?: Pull over shirt    Upper body assist Assist Level: Minimal Assistance - Patient > 75%    Lower Body Dressing/Undressing Lower body dressing      What is the patient wearing?: Incontinence brief, Pants     Lower body assist Assist for lower body dressing: Moderate Assistance - Patient 50 - 74%     Toileting Toileting    Toileting assist Assist for toileting: Moderate Assistance - Patient 50 - 74%     Transfers Chair/bed transfer  Transfers assist  Chair/bed transfer activity did not occur: Safety/medical concerns  Chair/bed transfer assist level: Minimal Assistance - Patient > 75%     Locomotion Ambulation   Ambulation assist   Ambulation activity did not occur: Safety/medical concerns  Assist level: 2 helpers Assistive device: Other (comment) (high table, and railing on wall) Max distance: 20   Walk 10 feet activity   Assist  Walk 10 feet activity did not occur: Safety/medical concerns  Assist level: 2 helpers Assistive device: Other (comment)   Walk 50 feet activity   Assist Walk 50 feet with 2 turns activity did not occur:  (did not perform turns)         Walk 150 feet activity   Assist Walk  150 feet activity did not occur: Safety/medical concerns         Walk 10 feet on uneven surface  activity   Assist Walk 10 feet on uneven surfaces activity did not occur: Safety/medical concerns         Wheelchair     Assist Will patient use wheelchair at discharge?:  (TBD)             Wheelchair 50 feet with 2 turns activity    Assist            Wheelchair 150 feet activity     Assist      Assist  Level: Supervision/Verbal cueing   Blood pressure 127/82, pulse 98, temperature 97.8 F (36.6 C), resp. rate 18, weight 96.2 kg, last menstrual period 08/21/2011, SpO2 99 %.  Medical Problem List and Plan: 1.  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  Antithrombotics: -DVT/anticoagulation: Eliquis             -antiplatelet therapy: N/A 3. Pain Management: Neurontin 100 mg every 8 hours, Zanaflex 2 mg twice daily as needed  Controlled 6/20.              Monitor with increased exertion 4. Mood: Provide emotional support- cannot assess accurately related to severe aphasia, empirically started on citalopram for "flat affect"  Which may be MCA infarct effect              -antipsychotic agents: N/A 5. Neuropsych: This patient is not capable of making decisions on her own behalf. 6. Skin/Wound Care: Routine skin checks 7. Fluids/Electrolytes/Nutrition: Routine in and outs.  Oral intake improving 75-100% will DC megace , still needs enc to drink fluids  8.  Post stroke dysphagia.    Dysphagia #1 pudding thick liquids.  Follow-up speech therapy.  Advance diet as tolerated 9.  Hypertension.  Coreg 50 mg twice daily,Bidil 20-37.5 mg 2 tabs 3 times daily            Vitals:   01/24/20 1925 01/25/20 0449  BP: (!) 151/82 127/82  Pulse: 90 98  Resp: 18 18  Temp: 98.1 F (36.7 C) 97.8 F (36.6 C)  SpO2: 100% 99%   6/22 controlled   10.  Atrial fibrillation.  Lanoxin 0.125 mg daily.  Cardiac rate controlled 11.  Chronic diastolic congestive heart failure.  Demadex 20 mg daily.  Monitor for any signs of fluid overload Filed Weights   01/23/20 0440 01/24/20 0510 01/25/20 0449  Weight: 96.3 kg 96.3 kg 96.2 kg   Stable weights, overall fluid intake reduced now that TF is off   6/22: relatively  stable 12.  Diabetes mellitus with peripheral neuropathy.  Hemoglobin A1c 11.6.  NovoLog 2 units every 4 hours, Lantus insulin 60 units nightly, Glucophage 500 mg twice daily CBG (last 3)  Recent Labs    01/24/20 1747 01/24/20 2122 01/25/20 0627  GLUCAP 365* 299* 180*   .   6/20: am CBG ok on Lantus  72U.  Metformin increased to 850mg  BID on 6/21-monitor effect               Monitor with increased mobility 13.  COPD with tobacco abuse.  Continue nebulizer treatments 14.  Polysubstance abuse.  Urine drug screen positive cocaine.    Counsel when appropriate 15.  Hyperlipidemia.  Crestor 16.  GERD.  Protonix 17.  AKI Creat ok mainly prerenal azotemia with elevated BUN - no need to hold metformin at this time  18. RIght  ATFL sprain, order ankle splint, may use Ice , Remove at noc  19.  Urinary retention may have diabetic cystopathy  -appears improved with urecholine will start to wean Off FLomax due to orthostatic hypotension, caths only at night discussed with nsg needs to try voiding on toilet or BSC at noc to avoid caths    LOS: 20 days A FACE TO FACE EVALUATION WAS PERFORMED  Charlett Blake 01/25/2020, 8:11 AM

## 2020-01-25 NOTE — Progress Notes (Signed)
Speech Language Pathology Daily Session Note  Patient Details  Name: Robin [name] MRN: [id number] Date of Birth: 1966/07/08  Today's Date: 01/25/2020 SLP Individual Time: 1331-1400 SLP Individual Time Calculation (min): 29 min  Short Term Goals: Week 3: SLP Short Term Goal 1 (Week 3): Pt will consume current diet with minimal overt s/sx aspiration and efficient mastication and oral clearance with Supervision A for use of safe swallow strategies. SLP Short Term Goal 2 (Week 3): Pt will verbally name common objects with 25% accuracy provided Max A multimodal cues. SLP Short Term Goal 3 (Week 3): Pt will imitate at the phoneme level with 50% accuracy provided Max A multimodal cues. SLP Short Term Goal 4 (Week 3): Pt will use communication board to express basic wants and needs with Min A multimodal cues. SLP Short Term Goal 5 (Week 3): Pt will sustain attention to functional tasks for 10 minute intervals with Min A cues for redirection. SLP Short Term Goal 6 (Week 3): Pt will detect functional errors in 50% of opportunities with Mod A verbal and visual cues cues.  Skilled Therapeutic Interventions: Pt was seen for skilled ST targeting cognitive goals. Upon arrival pt stated "I need to go to the ___" with an approximation of bathroom. Therefore, SLP provided Min A verbal and visual cues for sequencing, problem solving, sustained attention and safety awareness during transfer from bed to toilet via stedy. Pt was already incontinent of urine by the time she was on toilet attempting to void. Once clean and dry pt returned to bed. She provided automatic response "thank you" prior to SLP leaving room. Pt left in bed with alarm set and needs within reach. Continue per current plan of care.        Pain Pain Assessment Pain Scale: Faces Faces Pain Scale: No hurt Pain Type: Acute pain Pain Location: Ankle Pain Orientation: Right Pain Descriptors / Indicators: Grimacing;Discomfort Pain Onset: With  Activity Pain Intervention(s): Repositioned  Therapy/Group: Individual Therapy  Arbutus Leas 01/25/2020, 7:15 AM

## 2020-01-25 NOTE — Progress Notes (Signed)
Occupational Therapy Session Note  Patient Details  Name: Robin [name] MRN: [id number] Date of Birth: 10-Jun-1966  Today's Date: 01/25/2020 OT Individual Time: 9562-1308 OT Individual Time Calculation (min): 41 min    Short Term Goals: Week 3:  OT Short Term Goal 1 (Week 3): Pt will donn a pullover shirt with no more than min assist and min demonstrational cueing following hemi dressing techniques. OT Short Term Goal 2 (Week 3): Pt will use the RUE as a gross assist with selfcare tasks and mod facilitation. OT Short Term Goal 3 (Week 3): Pt will complete toilet transfers with mod assist stand pivot for 3 consecutive sessions. OT Short Term Goal 4 (Week 3): Pt will complete LB dressing sit to stand with min assist excluding TEDs.  Skilled Therapeutic Interventions/Progress Updates:    Session 1: [phone]) Pt completed transfer from the wheelchair to the therapy mat with mod assist stand pivot.  Worked on RUE weightbearing to start in sitting with lateral reaching to the right side to retrieve clothespins and place on the grid.  Mod assist for balance when reaching with transition to sit to squat position to place them as well.  Incorporated UE Ranger for right shoulder flexion with elbow extension to targets with overall min to mod assist from seated position.  Returned to wheelchair at the same level of mod assist with return to the room.  Pt left sitting up with safety belt in place and call button and phone in reach.       Session 2: [phone])  Pt in bed to start session, completed transfer from supine to sit EOB with mod assist.  Therapist assisted with donning pants and gripper socks in order to transfer to the wheelchair and go down to the gym for [employer] work.  She completed stand pivot transfer to the wheelchair with mod assist as well as to the therapy mat.  She transitioned to supine where she worked on [employer] with use of the [employer].  Therapist applied ace bandage to the RUE to  assist with maintaining grip on the hoop.  She then focused on movements of shoulder flexion and chest press to the ceiling with overall mod facilitation in the RUE.  She was able to initiate and complete some elbow extension and shoulder flexion but needed assist with the last 25% of the movement.  Transitioned back to sitting where she worked on functional reach to pick up small 2"X3" foam blocks placed on the rolling stool at knee level.  Mod facilitation needed for all movements secondary to apraxia.  Finished session with transfer back to the wheelchair and then back to the bed a mod assist level stand pivot.  She was able to transition to supine with min assist.  Call button and phone in reach with safety belt in place.    Therapy Documentation Precautions:  Precautions Precautions: Fall Precaution Comments: R hemiparesis, R inattention Restrictions Weight Bearing Restrictions: No   Pain: Pain Assessment Pain Scale: Faces Pain Score: 0-No pain Faces Pain Scale: Hurts a little bit Pain Type: Acute pain Pain Location: Ankle Pain Orientation: Right Pain Descriptors / Indicators: Grimacing;Discomfort Pain Onset: With Activity Pain Intervention(s): Repositioned ADL: See Care Tool Section for some details of mobility and selfcare  Therapy/Group: Individual Therapy  Ceil Roderick OTR/L 01/25/2020, 12:41 PM

## 2020-01-26 ENCOUNTER — Inpatient Hospital Stay (HOSPITAL_COMMUNITY): Payer: 59 | Admitting: Occupational Therapy

## 2020-01-26 ENCOUNTER — Inpatient Hospital Stay (HOSPITAL_COMMUNITY): Payer: 59 | Admitting: Speech Pathology

## 2020-01-26 ENCOUNTER — Ambulatory Visit: Payer: 59 | Admitting: Student

## 2020-01-26 ENCOUNTER — Inpatient Hospital Stay (HOSPITAL_COMMUNITY): Payer: 59

## 2020-01-26 LAB — BASIC METABOLIC PANEL
Anion gap: 11 (ref 5–15)
BUN: 18 mg/dL (ref 6–20)
CO2: 24 mmol/L (ref 22–32)
Calcium: 9.5 mg/dL (ref 8.9–10.3)
Chloride: 102 mmol/L (ref 98–111)
Creatinine, Ser: 1.08 mg/dL — ABNORMAL HIGH (ref 0.44–1.00)
GFR calc Af Amer: >60 mL/min (ref >60)
GFR calc non Af Amer: 59 mL/min — ABNORMAL LOW (ref >60)
Glucose, Bld: 185 mg/dL — ABNORMAL HIGH (ref 70–99)
Potassium: 4.2 mmol/L (ref 3.5–5.1)
Sodium: 137 mmol/L (ref 135–145)

## 2020-01-26 LAB — GLUCOSE, CAPILLARY
Glucose-Capillary: 168 mg/dL — ABNORMAL HIGH (ref 70–99)
Glucose-Capillary: 169 mg/dL — ABNORMAL HIGH (ref 70–99)
Glucose-Capillary: 187 mg/dL — ABNORMAL HIGH (ref 70–99)
Glucose-Capillary: 389 mg/dL — ABNORMAL HIGH (ref 70–99)
Glucose-Capillary: 308 mg/dL — ABNORMAL HIGH (ref 70–99)

## 2020-01-26 NOTE — Progress Notes (Signed)
Patient ID: Robin [name], female   DOB: 1965-11-27, 54 y.o.   MRN: [id number]   Sw provided Home Care agencies and Ramp information to sister.

## 2020-01-26 NOTE — Progress Notes (Signed)
Occupational Therapy Weekly Progress Note  Patient Details  Name: Robin [name] MRN: [id number] Date of Birth: 1965/11/25  Beginning of progress report period: February 19, 2020 End of progress report period: February 25, 2020  Today's Date: 01/26/2020 OT Individual Time: 8491-4760 OT Individual Time Calculation (min): 51 min    Patient has met 2 of 4 short term goals.  Robin [name] is making stedy progress with OT at this time.  She currently completes UB selfcare in sitting with min assist.  Lb bathing is at a min assist level sit to stand with use of a grab bar for support.  LB dressing is still at a mod assist level sit to stand for donning of brief, pants, TEDs, left ankle AFO and gripper socks.  She continues to demonstrate increased independence with transfers, just recently being able to complete stand pivot transfers with use of the RW and hand splint on the right side at min assist level.  She continues to demonstrate RUE and RLE weakness as well and with transfers, still demonstrates decreased accurate positioning of the RLE as well as some knee instability.  She is able to integrate the RUE into bathing tasks at [facility] assist level for washing the LUE.  Min assist is needed for using the RUE as a stabilizer when attempting to hold items to be opened, with max assist needed if attempting to integrate it for pulling up clothing.  Her receptive and expressive skills continue to be limited, but she is able to consistently follow one step commands.  Slight increased difficulty still observed when attempting to donn a pullover shirt following hemidressing techniques.  Feel she is making really good progress, however she will still need at least min assist 24 hr.  Family is still working on this with expected discharge on 6/30.  Will recommend continued CIR level OT with family education provide as soon as it can be scheduled if 24 hr assist is arranged.  If not available, recommend SNF for continued rehab.     Patient continues to demonstrate the following deficits: muscle weakness, impaired timing and sequencing, unbalanced muscle activation, decreased coordination and decreased motor planning, decreased attention to right, decreased awareness, decreased problem solving, decreased safety awareness and decreased memory and decreased standing balance, hemiplegia and decreased balance strategies and therefore will continue to benefit from skilled OT intervention to enhance overall performance with BADL and Reduce care partner burden.  Patient progressing toward long term goals..  Continue plan of care.  OT Short Term Goals Week 4:  OT Short Term Goal 1 (Week 4): Continue working on established LTGs set a min assist level overall.  Skilled Therapeutic Interventions/Progress Updates:    Pt worked on bathing and dressing during session.  She completed supine to sit EOB on the right side with mod assist and max assist for integration of the RUE to assist with transition from right sidelying to sitting.  She then used the RW with hand splint on the right for ambulation to the toilet with overall min assist.  She was able to complete clothing management to push down brief with min assist in standing.  She then completed toilet hygiene in sitting with the LUE but used the RUE as an gross assist for holding the toilet paper while she folded it.  She then used the RW with min assist and mod instructional cueing to transfer over to the shower bench.  Min assist was needed for completion of bathing sit to stand with  transfer out to the wheelchair at the sink with min assist using the walker once again.  She was able to donn a pullover shirt with min assist and mod demonstrational cueing for sequencing donning the right sleeve following hemi technique.  She was able to then donn her brief as well as pull up pants with mod assist.  Therapist assisted with donning TEDs and gripper socks secondary to decreased time at end of  session.  Pt was then transferred back to the bed per her request with min assist stand pivot.  She completed sit to supine with min guard and call button and phone were left in reach.    Therapy Documentation Precautions:  Precautions Precautions: Fall Precaution Comments: R hemiparesis, R inattention Restrictions Weight Bearing Restrictions: No  Pain:  See pain flowsheet for details ADL: See Care Tool Section for some details of mobility and selfcare  Therapy/Group: Individual Therapy  Shylyn Younce OTR/L 01/26/2020, 5:07 PM
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
## 2020-01-26 NOTE — Progress Notes (Signed)
[place] PHYSICAL MEDICINE & REHABILITATION PROGRESS NOTE   Subjective/Complaints:   No issues overnite , attempts to say "good morning" with phonemic errors  ROS: limited due to language/communication   Objective:   No results found. No results for input(s): WBC, HGB, HCT, PLT in the last 72 hours. Recent Labs    01/26/20 0549  NA 137  K 4.2  CL 102  CO2 24  GLUCOSE 185*  BUN 18  CREATININE 1.08*  CALCIUM 9.5    Intake/Output Summary (Last 24 hours) at 01/26/2020 0828 Last data filed at 01/26/2020 0300 Gross per 24 hour  Intake 240 ml  Output 600 ml  Net -360 ml     Physical Exam: Vital Signs Blood pressure 112/76, pulse 88, temperature 98 F (36.7 C), resp. rate 16, weight 96.2 kg, last menstrual period 08/21/2011, SpO2 95 %.   General: No acute distress Mood and affect are appropriate Heart: Regular rate and rhythm no rubs murmurs or extra sounds Lungs: Clear to auscultation, breathing unlabored, no rales or wheezes Abdomen: Positive bowel sounds, soft nontender to palpation, nondistended Extremities: No clubbing, cyanosis, or edema Skin: No evidence of breakdown, no evidence of rash   intact, motor strength is grossly 4- 5/5 in left . 3-/5 RIght  deltoid, bicep, tricep, grip, 5/5 left  0/5 right hip flexor, knee extensors, ankle dorsiflexor and plantar flexor,2- right hip ext Sensory withdraws to pain L>R     Musculoskeletal:wearing ASO   Assessment/Plan: 1. Functional deficits secondary to Cardioembolic Left MCA infarct  which require 3+ hours per day of interdisciplinary therapy in a comprehensive inpatient rehab setting.  Physiatrist is providing close team supervision and 24 hour management of active medical problems listed below.  Physiatrist and rehab team continue to assess barriers to discharge/monitor patient progress toward functional and medical goals  Care Tool:  Bathing    Body parts bathed by patient: Right arm, Chest, Abdomen, Front  perineal area, Right upper leg, Left upper leg, Face, Left lower leg, Right lower leg   Body parts bathed by helper: Left arm, Buttocks     Bathing assist Assist Level: Moderate Assistance - Patient 50 - 74%     Upper Body Dressing/Undressing Upper body dressing   What is the patient wearing?: Pull over shirt    Upper body assist Assist Level: Minimal Assistance - Patient > 75%    Lower Body Dressing/Undressing Lower body dressing      What is the patient wearing?: Incontinence brief, Pants     Lower body assist Assist for lower body dressing: Moderate Assistance - Patient 50 - 74%     Toileting Toileting    Toileting assist Assist for toileting: Moderate Assistance - Patient 50 - 74%     Transfers Chair/bed transfer  Transfers assist  Chair/bed transfer activity did not occur: Safety/medical concerns  Chair/bed transfer assist level: Moderate Assistance - Patient 50 - 74%     Locomotion Ambulation   Ambulation assist   Ambulation activity did not occur: Safety/medical concerns  Assist level: 2 helpers (mod A of 1 and +2 w/c follow) Assistive device: Walker-rolling Max distance: 177f   Walk 10 feet activity   Assist  Walk 10 feet activity did not occur: Safety/medical concerns  Assist level: 2 helpers (mod A of 1 and +2 w/c follow) Assistive device: Walker-rolling   Walk 50 feet activity   Assist Walk 50 feet with 2 turns activity did not occur: Safety/medical concerns (not yet making turns)  Walk 150 feet activity   Assist Walk 150 feet activity did not occur: Safety/medical concerns         Walk 10 feet on uneven surface  activity   Assist Walk 10 feet on uneven surfaces activity did not occur: Safety/medical concerns         Wheelchair     Assist Will patient use wheelchair at discharge?:  (TBD)             Wheelchair 50 feet with 2 turns activity    Assist            Wheelchair 150 feet  activity     Assist      Assist Level: Supervision/Verbal cueing   Blood pressure 112/76, pulse 88, temperature 98 F (36.7 C), resp. rate 16, weight 96.2 kg, last menstrual period 08/21/2011, SpO2 95 %.  Medical Problem List and Plan: 1.  Right-sided hemiparesis and aphasia secondary to left MCA infarction/petechial hemorrhage with saccular aneurysm at the paraclinoid right ICA status post mechanical thrombectomy Team conference today please see physician documentation under team conference tab, met with team  to discuss problems,progress, and goals. Formulized individual treatment plan based on medical history, underlying problem and comorbidities.   Continue CIR, will extend stay , slow progress   WHO/PRAFO qhs  2.  Antithrombotics: -DVT/anticoagulation: Eliquis             -antiplatelet therapy: N/A 3. Pain Management: Neurontin 100 mg every 8 hours, Zanaflex 2 mg twice daily as needed  Controlled 6/20.              Monitor with increased exertion 4. Mood: Provide emotional support- cannot assess accurately related to severe aphasia, empirically started on citalopram for "flat affect"  Which may be MCA infarct effect              -antipsychotic agents: N/A 5. Neuropsych: This patient is not capable of making decisions on her own behalf. 6. Skin/Wound Care: Routine skin checks 7. Fluids/Electrolytes/Nutrition: Routine in and outs.  Oral intake improving 75-100% will DC megace , still needs enc to drink fluids  8.  Post stroke dysphagia.    Dysphagia #1 pudding thick liquids.  Follow-up speech therapy.  Advance diet as tolerated 9.  Hypertension.  Coreg 50 mg twice daily,Bidil 20-37.5 mg 2 tabs 3 times daily            Vitals:   01/25/20 2029 01/26/20 0302  BP: (!) 109/53 112/76  Pulse: 89 88  Resp: 18 16  Temp: 98.3 F (36.8 C) 98 F (36.7 C)  SpO2: 98% 95%   6/23 controlled   10.  Atrial fibrillation.  Lanoxin 0.125 mg daily.  Cardiac rate controlled 11.  Chronic  diastolic congestive heart failure.  Demadex 20 mg daily.  Monitor for any signs of fluid overload Filed Weights   01/24/20 0510 01/25/20 0449 01/26/20 0500  Weight: 96.3 kg 96.2 kg 96.2 kg   Stable weights, overall fluid intake reduced now that TF is off   6/23: relatively stable 12.  Diabetes mellitus with peripheral neuropathy.  Hemoglobin A1c 11.6.  NovoLog 2 units every 4 hours, Lantus insulin 60 units nightly, Glucophage 500 mg twice daily CBG (last 3)  Recent Labs    01/25/20 1655 01/25/20 2031 01/26/20 0607  GLUCAP 246* 329* 168*   .   6/20: am CBG ok on Lantus  72U.  Metformin increased to 863m BID on 6/21-monitor effect, may need to increase to 1008min  1-2 d if CBG still >200              Monitor with increased mobility 13.  COPD with tobacco abuse.  Continue nebulizer treatments 14.  Polysubstance abuse.  Urine drug screen positive cocaine.    Counsel when appropriate 15.  Hyperlipidemia.  Crestor 16.  GERD.  Protonix 17.  AKI Creat ok mainly prerenal azotemia with elevated BUN - no need to hold metformin at this time  18. RIght  ATFL sprain, order ankle splint, may use Ice , Remove at noc  19.  Urinary retention may have diabetic cystopathy  -appears improved with urecholine will start to wean Off FLomax due to orthostatic hypotension, caths only at night discussed with nsg needs to try voiding on toilet or BSC at noc to avoid caths    LOS: 21 days A FACE TO [place] E Dalphine Cowie 01/26/2020, 8:28 AM

## 2020-01-26 NOTE — Progress Notes (Signed)
Speech Language Pathology Daily Session Note  Patient Details  Name: Robin [name] MRN: [id number] Date of Birth: 1965/09/26  Today's Date: 01/26/2020 SLP Individual Time: 1700-1749 SLP Individual Time Calculation (min): 55 min  Short Term Goals: Week 3: SLP Short Term Goal 1 (Week 3): Pt will consume current diet with minimal overt s/sx aspiration and efficient mastication and oral clearance with Supervision A for use of safe swallow strategies. SLP Short Term Goal 2 (Week 3): Pt will verbally name common objects with 25% accuracy provided Max A multimodal cues. SLP Short Term Goal 3 (Week 3): Pt will imitate at the phoneme level with 50% accuracy provided Max A multimodal cues. SLP Short Term Goal 4 (Week 3): Pt will use communication board to express basic wants and needs with Min A multimodal cues. SLP Short Term Goal 5 (Week 3): Pt will sustain attention to functional tasks for 10 minute intervals with Min A cues for redirection. SLP Short Term Goal 6 (Week 3): Pt will detect functional errors in 50% of opportunities with Mod A verbal and visual cues cues.  Skilled Therapeutic Interventions: Pt was seen for skilled ST targeting dysphagia and communication. SLP provided upgraded trial of regular texture solids (peanut butter crackers) and thin liquid. Pt with no overt s/sx aspiration across textures. Her mastication was efficient and total oral clearance achieved. She was Mod I for use of safe swallow strategies. Recommend pt advance to regular texture diet, continue thin liquids and medications whole with thins. Please continue to provide full supervision during meals, however ST will work toward decreasing supervision requirements. Today pt was expressing her basic wants, needs, and preferences via gestures, primarily pointing, with ~Min A verbal cues from clinician for clarification. SLP provided music of pt's preference (90s and 2000's R&B), to which she demonstrated ability to hum along  consistently. She sang ~15% of the lyrics to "[employer]". Attempted to provide orthographic cues to increase verbal output of lyrics, however less successful than at word level, likely due to more complex language. Pt with decreased accuracy in productions at the word level today when naming common objects within photographs, despite Max A semantic, phonetic, and orthographic cues (2/6 accuracy). Pt left laying in bed with alarm set and needs within reach. Continue per current plan of care.          Pain Pain Assessment Pain Scale: 0-10 Pain Score: 0-No pain  Therapy/Group: Individual Therapy  Arbutus Leas 01/26/2020, 7:23 AM

## 2020-01-26 NOTE — Progress Notes (Signed)
Nutrition Follow-up  RD working remotely.  DOCUMENTATION CODES:   Obesity unspecified  INTERVENTION:   - Continue MVI with minerals daily  - Continue Pro-stat 30 ml po BID, each supplement provides 100 kcal and 15 grams of protein  - Continue Glucerna Shake po BID with meals, each supplement provides 220 kcal and 10 grams of protein  NUTRITION DIAGNOSIS:   Inadequate oral intake related to dysphagia as evidenced by meal completion < 25%.  Progressing  GOAL:   Patient will meet greater than or equal to 90% of their needs  Progressing  MONITOR:   PO intake, Supplement acceptance, Labs, Weight trends  REASON FOR ASSESSMENT:   Consult Enteral/tube feeding initiation and management  ASSESSMENT:   54 year old female with PMH of CHF, HTN, atrial fibrillation, COPD, polysubstance abuse, DM, and tobacco abuse. Presented on 12/27/19 with right hemiparesis, AMS, and aphasia. MRI showed evolving left MCA infarction with hemorrhagic transformation. Pt underwent mechanical thrombectomy per interventional radiology and required intubation and was extubated 12/28/19. Started on dysphagia 1 diet with pudding-thick liquids with Cortrak tube in place for nutritional support. Admitted to CIR on 01/05/20.  6/11 - MBS, diet advanced to dysphagia 2 with thin liquids 6/15 - TF d/c 6/16 - Cortrak removed 6/23 - diet advanced to regular textures with thin liquids  Pt accepting ~50% of Glucerna Shakes and 100% of Pro-stat per [facility] documentation.  Will continue with current supplement regimen.  Weight stable compared to admit weight.  Noted Megace has been d/c.  Meal Completion: 25-100% x last 8 meals (averaging 72%)  Medications reviewed and include: Glucerna Shake BID, Pro-stat BID, SSI, Lantus 72 units daily, MVI with minerals, torsemide  Labs reviewed. CBG's: 168-329 x 24 hours  Diet Order:   Diet Order            Diet regular Room service appropriate? Yes; Fluid consistency:  Thin  Diet effective now                 EDUCATION NEEDS:   No education needs have been identified at this time  Skin:  Skin Assessment: Reviewed RN Assessment (MASD perineum, blister to right thigh)  Last BM:  01/26/20 large type 7  Height:   Ht Readings from Last 1 Encounters:  12/31/19 5\' 6"  (1.676 m)    Weight:   Wt Readings from Last 1 Encounters:  01/26/20 96.2 kg    Ideal Body Weight:  59.1 kg  BMI:  Body mass index is 34.23 kg/m.  Estimated Nutritional Needs:   Kcal:  1800-2000  Protein:  100-115 grams  Fluid:  1.8-2.0 L    Gaynell Face, MS, RD, LDN Inpatient Clinical Dietitian Pager: [phone] Weekend/After Hours: [phone]

## 2020-01-26 NOTE — Progress Notes (Signed)
Physical Therapy Session Note  Patient Details  Name: Robin [name] MRN: [id number] Date of Birth: [birth year]  Today's Date: 01/26/2020 PT Individual Time: 9842-1031 PT Individual Time Calculation (min): 54 min   Short Term Goals: Week 3:  PT Short Term Goal 1 (Week 3): Pt will perform supine<>sit with min assist PT Short Term Goal 2 (Week 3): Pt will ambulate at least 73ft using LRAD with mod assist of 1 and +2 w/c follow if needed PT Short Term Goal 3 (Week 3): Pt will initiate stair navigation  Skilled Therapeutic Interventions/Progress Updates:     Pt received supine in bed and agreeable to therapy. No report of pain. Supine to sit with supervision for positioning at EOB for safety. Pt performs stand step transfer from bed to [facility] with RW and minA from PT. Cues for R hand placement in splint and sequencing of transfer. Pt ambulates 32' with RW and modA +1 and +1 WC follow. Pt has difficulty managing RW and requires frequent redirection, also requiring manual assistance to progress RLE and clear R foot. Pt does not demo any buckling or RLE, however. Pt performs additional 30' ambulation with mirror for visual feedback and ace wrap in RLE to facilitate dorsiflexion.   Pt self propels WC with BUEs and maxA from PT for hand-over-hand assist for RUE propulsion. Total distance x100'.   Pt performs several stand pivot transfer with RW with block training on hand placement and sequencing of transfer. Pt requires reminder to strap R hand in splint each time and also frequently attempts to stand prior safely positioning RUE.  Pt performs targeted stepping with LLE and PT blocking RLE. Activity performed to encourage lateral weight shifting and WB through RLE. Pt has difficulty maintaining R knee in extension but no true buckling. Pt requires modA for activity to maintain balance.  WC transport back to room. minA HHA for stand pivot transfer back to bed. Sit to supine with supervision and pt left  supine in bed with alarm intact and all needs within reach.  Therapy Documentation Precautions:  Precautions Precautions: Fall Precaution Comments: R hemiparesis, R inattention Restrictions Weight Bearing Restrictions: No    Therapy/Group: Individual Therapy  Robin [name], PT, DPT 01/26/2020, 4:05 PM

## 2020-01-27 ENCOUNTER — Inpatient Hospital Stay (HOSPITAL_COMMUNITY): Payer: 59 | Admitting: Physical Therapy

## 2020-01-27 ENCOUNTER — Inpatient Hospital Stay (HOSPITAL_COMMUNITY): Payer: 59 | Admitting: Occupational Therapy

## 2020-01-27 ENCOUNTER — Ambulatory Visit: Payer: 59 | Admitting: Dietician

## 2020-01-27 ENCOUNTER — Encounter: Payer: 59 | Attending: Internal Medicine | Admitting: Dietician

## 2020-01-27 ENCOUNTER — Inpatient Hospital Stay (HOSPITAL_COMMUNITY): Payer: 59 | Admitting: Speech Pathology

## 2020-01-27 DIAGNOSIS — Z794 Long term (current) use of insulin: Secondary | ICD-10-CM | POA: Insufficient documentation

## 2020-01-27 DIAGNOSIS — E1121 Type 2 diabetes mellitus with diabetic nephropathy: Secondary | ICD-10-CM | POA: Insufficient documentation

## 2020-01-27 DIAGNOSIS — Z6835 Body mass index (BMI) 35.0-35.9, adult: Secondary | ICD-10-CM | POA: Insufficient documentation

## 2020-01-27 DIAGNOSIS — N1831 Chronic kidney disease, stage 3a: Secondary | ICD-10-CM | POA: Insufficient documentation

## 2020-01-27 LAB — GLUCOSE, CAPILLARY
Glucose-Capillary: 195 mg/dL — ABNORMAL HIGH (ref 70–99)
Glucose-Capillary: 292 mg/dL — ABNORMAL HIGH (ref 70–99)
Glucose-Capillary: 207 mg/dL — ABNORMAL HIGH (ref 70–99)
Glucose-Capillary: 256 mg/dL — ABNORMAL HIGH (ref 70–99)
Glucose-Capillary: 284 mg/dL — ABNORMAL HIGH (ref 70–99)

## 2020-01-27 MED ORDER — METFORMIN HCL 500 MG PO TABS
1000.0000 mg | ORAL_TABLET | Freq: Two times a day (BID) | ORAL | Status: DC
  Administered 2020-01-27 – 2020-02-02 (×13): 1000 mg
  Filled 2020-01-27 (×12): qty 2

## 2020-01-27 NOTE — Progress Notes (Signed)
[place] PHYSICAL MEDICINE & REHABILITATION PROGRESS NOTE   Subjective/Complaints:  No issues overnite, remains aphasic  ROS: limited due to language/communication   Objective:   No results found. No results for input(s): WBC, HGB, HCT, PLT in the last 72 hours. Recent Labs    01/26/20 0549  NA 137  K 4.2  CL 102  CO2 24  GLUCOSE 185*  BUN 18  CREATININE 1.08*  CALCIUM 9.5    Intake/Output Summary (Last 24 hours) at 01/27/2020 0723 Last data filed at 01/26/2020 1800 Gross per 24 hour  Intake 998 ml  Output --  Net 998 ml     Physical Exam: Vital Signs Blood pressure 132/71, pulse 84, temperature 98.7 F (37.1 C), temperature source Oral, resp. rate 16, weight 96 kg, last menstrual period 08/21/2011, SpO2 100 %.   General: No acute distress Mood and affect are appropriate Heart: Regular rate and rhythm no rubs murmurs or extra sounds Lungs: Clear to auscultation, breathing unlabored, no rales or wheezes Abdomen: Positive bowel sounds, soft nontender to palpation, nondistended Extremities: No clubbing, cyanosis, or edema Skin: No evidence of breakdown, no evidence of rash   intact, motor strength is grossly 4- 5/5 in left . 3-/5 RIght  deltoid, bicep, tricep, grip, 5/5 left  3-/5 right hip flexor, knee extensors, ankle dorsiflexor and plantar flexor,2- right hip ext     Musculoskeletal:No right ankle swelling, no TTP over ATFL, mild pain with passive supination RIght ankle   Assessment/Plan: 1. Functional deficits secondary to Cardioembolic Left MCA infarct  which require 3+ hours per day of interdisciplinary therapy in a comprehensive inpatient rehab setting.  Physiatrist is providing close team supervision and 24 hour management of active medical problems listed below.  Physiatrist and rehab team continue to assess barriers to discharge/monitor patient progress toward functional and medical goals  Care Tool:  Bathing    Body parts bathed by patient:  Right arm, Chest, Abdomen, Front perineal area, Right upper leg, Left upper leg, Face, Left lower leg, Right lower leg, Buttocks   Body parts bathed by helper: Left arm     Bathing assist Assist Level: Minimal Assistance - Patient > 75%     Upper Body Dressing/Undressing Upper body dressing   What is the patient wearing?: Pull over shirt    Upper body assist Assist Level: Minimal Assistance - Patient > 75%    Lower Body Dressing/Undressing Lower body dressing      What is the patient wearing?: Incontinence brief, Pants     Lower body assist Assist for lower body dressing: Moderate Assistance - Patient 50 - 74%     Toileting Toileting    Toileting assist Assist for toileting: Moderate Assistance - Patient 50 - 74%     Transfers Chair/bed transfer  Transfers assist  Chair/bed transfer activity did not occur: Safety/medical concerns  Chair/bed transfer assist level: Minimal Assistance - Patient > 75% (stand pivot with the RW) Chair/bed transfer assistive device: Programmer, multimedia   Ambulation assist   Ambulation activity did not occur: Safety/medical concerns  Assist level: 2 helpers Assistive device: Walker-rolling Max distance: 80'   Walk 10 feet activity   Assist  Walk 10 feet activity did not occur: Safety/medical concerns  Assist level: 2 helpers Assistive device: Walker-rolling   Walk 50 feet activity   Assist Walk 50 feet with 2 turns activity did not occur: Safety/medical concerns (not yet making turns)         Walk 150 feet  activity   Assist Walk 150 feet activity did not occur: Safety/medical concerns         Walk 10 feet on uneven surface  activity   Assist Walk 10 feet on uneven surfaces activity did not occur: Safety/medical concerns         Wheelchair     Assist Will patient use wheelchair at discharge?:  (TBD)             Wheelchair 50 feet with 2 turns activity    Assist             Wheelchair 150 feet activity     Assist      Assist Level: Supervision/Verbal cueing   Blood pressure 132/71, pulse 84, temperature 98.7 F (37.1 C), temperature source Oral, resp. rate 16, weight 96 kg, last menstrual period 08/21/2011, SpO2 100 %.  Medical Problem List and Plan: 1.  Right-sided hemiparesis and aphasia secondary to left MCA infarction/petechial hemorrhage with saccular aneurysm at the paraclinoid right ICA status post mechanical thrombectomy  Continue CIR PT, OT, SLP   WHO/PRAFO qhs  2.  Antithrombotics: -DVT/anticoagulation: Eliquis             -antiplatelet therapy: N/A 3. Pain Management: Neurontin 100 mg every 8 hours, Zanaflex 2 mg twice daily as needed  Controlled 6/20.              Monitor with increased exertion 4. Mood: Provide emotional support- cannot assess accurately related to severe aphasia, empirically started on citalopram for "flat affect"  Which may be MCA infarct effect              -antipsychotic agents: N/A 5. Neuropsych: This patient is not capable of making decisions on her own behalf. 6. Skin/Wound Care: Routine skin checks 7. Fluids/Electrolytes/Nutrition: Routine in and outs.  Oral intake improving 75-100% will Drinking  fluids  951ml 6/23 8.  Post stroke dysphagia.    Dysphagia #1 pudding thick liquids.  Follow-up speech therapy.  Advance diet as tolerated 9.  Hypertension.  Coreg 50 mg twice daily,Bidil 20-37.5 mg 2 tabs 3 times daily            Vitals:   01/26/20 2035 01/27/20 0532  BP: 117/72 132/71  Pulse: 96 84  Resp: 16 16  Temp: 99.3 F (37.4 C) 98.7 F (37.1 C)  SpO2: 100% 100%   6/24 controlled   10.  Atrial fibrillation.  Lanoxin 0.125 mg daily.  Cardiac rate controlled 11.  Chronic diastolic congestive heart failure.  Demadex 20 mg daily.  Monitor for any signs of fluid overload Filed Weights   01/25/20 0449 01/26/20 0500 01/27/20 0532  Weight: 96.2 kg 96.2 kg 96 kg   Stable weights, overall fluid intake  reduced now that TF is off   6/23: relatively stable 12.  Diabetes mellitus with peripheral neuropathy.  Hemoglobin A1c 11.6.  NovoLog 2 units every 4 hours, Lantus insulin 60 units nightly, Glucophage 500 mg twice daily CBG (last 3)  Recent Labs    01/26/20 1646 01/26/20 2109 01/27/20 0618  GLUCAP 389* 308* 195*   .   6/20: am CBG ok on Lantus  72U.  Metformin increased to 850mg  BID on 6/21-monitor effect,need to increase to 1000mg  BID              Monitor with increased mobility 13.  COPD with tobacco abuse.  Continue nebulizer treatments 14.  Polysubstance abuse.  Urine drug screen positive cocaine.    Counsel when appropriate  15.  Hyperlipidemia.  Crestor 16.  GERD.  Protonix 17.  AKI Creat ok mainly prerenal azotemia with elevated BUN - no need to hold metformin at this time  18. RIght  ATFL sprain, order ankle splint, may use Ice , Remove at noc  19.  Urinary retention may have diabetic cystopathy  -appears improved with urecholine DC Off FLomax due to orthostatic hypotension, caths only at night discussed with nsg needs to try voiding on toilet or BSC at noc to avoid caths    LOS: 22 days A FACE TO [place] E Eyal Greenhaw 01/27/2020, 7:23 AM

## 2020-01-27 NOTE — Plan of Care (Signed)
  Problem: RH Balance Goal: LTG Patient will maintain dynamic standing balance (PT) Description: LTG:  Patient will maintain dynamic standing balance with assistance during mobility activities (PT) Flowsheets (Taken 01/27/2020 2248) LTG: Pt will maintain dynamic standing balance during mobility activities with:: (downgraded based on pt progress) Moderate Assistance - Patient 50 - 74% Note: downgraded based on pt progress

## 2020-01-27 NOTE — Progress Notes (Signed)
Occupational Therapy Session Note  Patient Details  Name: Robin [name] MRN: [id number] Date of Birth: 1966/04/21  Today's Date: 01/27/2020 OT Individual Time: 9826-4158 OT Individual Time Calculation (min): 74 min    Short Term Goals: Week 4:  OT Short Term Goal 1 (Week 4): Continue working on established LTGs set a min assist level overall.  Skilled Therapeutic Interventions/Progress Updates:    Pt began session with transfer from supine to sit EOB with mod assist to the right side.  She then performed squat pivot transfer to the wheelchair at mod assist level.  Therapist took her down to the ortho gym where she worked on Patent attorney with use of the ergonometer to begin.  She began with use of BUEs and resistance on level 6 for 2 mins.  Ace bandage was used for maintaining grip on the right hand.  She then completed 3 sets of 2 mins on level 1 resistance with isolated use of the RUE only.  Min assist needed for elbow extension to complete revolutions.  Next, had her work on functional mobility with hand held assist for distances of 30 and 45 ft.  She needed mod facilitation in the right knee to maintain extension during stance phase, but she could advance the RLE with min guard assist.  Increased adduction noted however.  She worked on static standing at the high/low mat while having to maintain right knee extension but also keep the RUE balanced on a therapy ball.  She incorporated shoulder flexion and elbow extension to push the ball forward.  Added slight incline and had pt work on pushing the ball forward and maintaining it with min to mod facilitation secondary to motor impersistence and apraxia.  Returned to the room at the end of the session with pt transferring back to the bed with mod assist stand pivot to rest.  Call button and phone in reach with safety belt in place.    Therapy Documentation Precautions:  Precautions Precautions: Fall Precaution  Comments: R hemiparesis, R inattention Restrictions Weight Bearing Restrictions: No  Pain: Pain Assessment Pain Scale: Faces Pain Score: 0-No pain Faces Pain Scale: No hurt ADL: See Care Tool Section for some details of mobility and selfcare  Therapy/Group: Individual Therapy  Square Jowett OTR/L 01/27/2020, 11:05 AM

## 2020-01-27 NOTE — Plan of Care (Signed)
  Problem: RH Swallowing Goal: LTG Patient will consume least restrictive diet using compensatory strategies with assistance (SLP) Description: LTG:  Patient will consume least restrictive diet using compensatory strategies with assistance (SLP) Flowsheets (Taken 01/27/2020 0720) LTG: Pt Patient will consume least restrictive diet using compensatory strategies with assistance of (SLP): Supervision Note: Upgraded due to greater than expected progress Goal: LTG Patient will participate in dysphagia therapy to increase swallow function with assistance (SLP) Description: LTG:  Patient will participate in dysphagia therapy to increase swallow function with assistance (SLP) Flowsheets (Taken 01/27/2020 0720) LTG: Pt will participate in dysphagia therapy to increase swallow function with assistance of (SLP): Supervision Note: Upgraded due to greater than expected progress   Dysphagia goals upgraded 01/27/20 due to greater than expected progress in this treatment area

## 2020-01-27 NOTE — Progress Notes (Signed)
Speech Language Pathology Weekly Progress and Session Note  Patient Details  Name: Robin [name] MRN: [id number] Date of Birth: 01/25/1966  Beginning of progress report period: January 20, 2020 End of progress report period: January 27, 2020  Today's Date: 01/27/2020 SLP Individual Time: 8453-6468 SLP Individual Time Calculation (min): 58 min  Short Term Goals: Week 3: SLP Short Term Goal 1 (Week 3): Pt will consume current diet with minimal overt s/sx aspiration and efficient mastication and oral clearance with Supervision A for use of safe swallow strategies. SLP Short Term Goal 1 - Progress (Week 3): Met SLP Short Term Goal 2 (Week 3): Pt will verbally name common objects with 25% accuracy provided Max A multimodal cues. SLP Short Term Goal 2 - Progress (Week 3): Met SLP Short Term Goal 3 (Week 3): Pt will imitate at the phoneme level with 50% accuracy provided Max A multimodal cues. SLP Short Term Goal 3 - Progress (Week 3): Met SLP Short Term Goal 4 (Week 3): Pt will use communication board to express basic wants and needs with Min A multimodal cues. SLP Short Term Goal 4 - Progress (Week 3): Progressing toward goal SLP Short Term Goal 5 (Week 3): Pt will sustain attention to functional tasks for 10 minute intervals with Min A cues for redirection. SLP Short Term Goal 5 - Progress (Week 3): Met SLP Short Term Goal 6 (Week 3): Pt will detect functional errors in 50% of opportunities with Mod A verbal and visual cues cues. SLP Short Term Goal 6 - Progress (Week 3): Met    New Short Term Goals: Week 4: SLP Short Term Goal 1 (Week 4): STG=LTG due to remaining length of stay  Weekly Progress Updates: Pt has made excellent functional gains over the last reporting period, meeting 5 out of 6 short term goals. She has been highly engaged in all therapy sessions, verbal output and more automatic language responses are increasing as well. Pt is currently expressing her basic wants and needs most  effectively via pointing and other comparable gestures with Min assist due to fluent expressive aphasia. Although she has a picture communication board to also assist with expression of basic wants and needs, it has not proven to be as effective as pt's gestures. She requires Min-Mod A cues for sustained attention and problem solving due to cognitive impairments. Pt was upgraded to a regular texture diet with thin liquids and is using swallow precautions with no more than Supervision A (goal upgraded). Pt has demonstrated improved sustained attention, verbalization at the phoneme and word level, basic problem solving, and self-monitoring. Pt education is ongoing; no family has been present for ST sessions since the beginning of pt's admission; family would greatly benefit from education regarding pt's cognitive-linguistic functioning prior to d/c. Pt would continue to benefit from skilled ST while inpatient in order to maximize functional independence and reduce burden of care prior to discharge. Anticipate that pt will need 24/7 supervision at discharge in addition to [place] follow up at next level of care.       Intensity: Minumum of 1-2 x/day, 30 to 90 minutes Frequency: 3 to 5 out of 7 days Duration/Length of Stay: 02/02/20 Treatment/Interventions: Cognitive remediation/compensation;Cueing hierarchy;Dysphagia/aspiration precaution training;Functional tasks;Environmental controls;Internal/external aids;Multimodal communication approach;Speech/Language facilitation;Therapeutic Activities;Patient/family education   Daily Session  Skilled Therapeutic Interventions: Pt was seen for skilled ST targeting communication goals. In informal conversation regarding movie playing in pt's room, pt able to describe (at the word level) 2 actions within a scene  with Max A semantic, phonetic, and written cues. She named common objects with 2/6 accuracy with sentence completion and written cues, 2 additional responses were  approximations. In automatic speech tasks, she verbalized days out of the week with 5/7 as best accuracy, out of several repetitions. She also counted from 1-10 with 70% accuracy. Her verbal errors continue to be very inconsistent, and she is intermittently aware. Pt communicated that her right hand was hurting via gestures with Min A verbal cues for clarification from SLP. She declined offer for repositioning or to contact RN for pain medication, but did follow directions to apply touch/rubbing, which she indicated (via gestures) alleviated some of pain sensation. Pt left laying in bed with alarm set and needs within reach. Continue per current plan of care.          Pain Pain Assessment Pain Scale: Faces Faces Pain Scale: Hurts a little bit Pain Type: Acute pain Pain Location: Hand Pain Orientation: Right Pain Descriptors / Indicators: Grimacing Pain Intervention(s): Distraction Multiple Pain Sites: No  Therapy/Group: Individual Therapy  Arbutus Leas 01/27/2020, 7:05 AM

## 2020-01-27 NOTE — Progress Notes (Signed)
Physical Therapy Weekly Progress Note  Patient Details  Name: Robin [name] MRN: [id number] Date of Birth: 02/06/1966  Beginning of progress report period: January 20, 2020 End of progress report period: January 27, 2020  Today's Date: 01/27/2020 PT Individual Time: 0805-0909 PT Individual Time Calculation (min): 64 min   Patient has met 2 of 3 short term goals.  Robin [name] is progressing well with therapy demonstrating improving R LE functional strength, standing balance, and R attention resulting in improvements in functional mobility levels. She is performing supine<>sit with min assist, sit<>stands with min assist, squat pivot or progression to stand pivot transfers using RW with min/mod assist, and ambulating up to 109ft using RW with mod assist and +2 w/c follow. She continues to demonstrate impaired R LE strength, motor control, and motor planning during gait lacking adequate hip/knee flexion during swing with lack of ankle DF activation though now has minimal range movement as well as excessive hip external rotation during swing and sustained R knee flexed during stance. Feel she is making really good progress; however, will still require 24hr min assist support upon discharge.  Patient continues to demonstrate the following deficits muscle weakness and muscle joint tightness, decreased cardiorespiratoy endurance, impaired timing and sequencing, abnormal tone, unbalanced muscle activation, motor apraxia, decreased coordination and decreased motor planning, decreased attention to right, decreased initiation, decreased attention, decreased awareness, decreased problem solving, decreased safety awareness, decreased memory and delayed processing and decreased sitting balance, decreased standing balance, decreased postural control and decreased balance strategies and therefore will continue to benefit from skilled PT intervention to increase functional independence with mobility.  Patient progressing  toward long term goals. Modified dynamic standing balance goal to mod assist due to continued R inattention and R LE paresis. Continue plan of care.  PT Short Term Goals Week 3:  PT Short Term Goal 1 (Week 3): Pt will perform supine<>sit with min assist PT Short Term Goal 1 - Progress (Week 3): Met PT Short Term Goal 2 (Week 3): Pt will ambulate at least 50ft using LRAD with mod assist of 1 and +2 w/c follow if needed PT Short Term Goal 2 - Progress (Week 3): Met PT Short Term Goal 3 (Week 3): Pt will initiate stair navigation PT Short Term Goal 3 - Progress (Week 3): Progressing toward goal Week 4:  PT Short Term Goal 1 (Week 4): = to LTGs based on ELOS  Skilled Therapeutic Interventions/Progress Updates:  Ambulation/gait training;Community reintegration;DME/adaptive equipment instruction;Psychosocial support;Neuromuscular re-education;Stair training;UE/LE Strength taining/ROM;Wheelchair propulsion/positioning;Discharge planning;Balance/vestibular training;Functional electrical stimulation;Pain management;Skin care/wound management;Therapeutic Activities;UE/LE Coordination activities;Cognitive remediation/compensation;Disease management/prevention;Functional mobility training;Patient/family education;Splinting/orthotics;Therapeutic Exercise;Visual/perceptual remediation/compensation   Pt received supine in bed and agreeable to therapy session. In supine donned B LE thigh high TED hose max assist. Supine>sitting R EOB, HOB partially elevated and using bedrails, with min assist for trunk upright. Sitting EOB, supervision for trunk control, donned R ankle brace max assist - pt demonstrates some active ankle DF. Pt requesting to use bathroom. Gait ~15ft x2 in/out of bathroom using RW with min/mod assist - pt continues to demonstrate excessive R LE external rotation though improving and decreased R LE hip/knee flexion during swing. Continent of bladder and bowels. Standing with UE support on RW or grab bar  performed peri-care and LB clothing management max assist. In sitting, donned pants with max assist for threading LEs, shirt with min assist, and attempted to don different shoe but does not fit due to brace and some R foot swelling. Standing hand hygiene at   sink with min assist for balance due to R lean, pt has sustained R knee flexion in standing and unable to correct despite cuing. Pt appears to have possible lightheadedness symptoms while standing at sink but difficult to determine due to aphasia. Transported to/from gym in w/c for time management and energy conservation.  Standing with UE support on litegait donned harness with +2 assist for time management. Stepped on/off treadmill while in litegait harness for support with min assist for balance and cuing for sequencing. Participated in the following locomotor treadmill training trails using litegait harness for partial body weight support:  1st trial: 1min53seconds at 0.5mph totaling 79ft with heavy max assist R LE management and +2 facilitating R/L lateral weight shifting on stance limb - pt demonstrates lack of adequate R hip/knee flexion during swing and sustained R knee flexion during stance - therapist manually facilitating for improvement but difficult to cue pt during task due to aphasia 2nd trial: 50seconds at 0.5mph totaling 37ft - donned R LE stockinette but due to pt not having tennis shoes on the stockinette slid off pt's foot requiring end of treadmill trial and pt also noted to be fatigued and possibly lightheaded  Stepped off treadmill and doffed harness as described above. Transported back to room and assessed vitals: BP 98/66 (MAP 74), HR 103bpm, SpO2 100%.  Pt requesting to return to bed. L squat pivot w/c>EOB with min assist. Sit>supine with min assist for R LE management onto bed. Pt left supine in bed with needs in reach and bed alarm on. RN notified of low BP and recommended assessing blood sugar and BP.   Therapy  Documentation Precautions:  Precautions Precautions: Fall Precaution Comments: R hemiparesis, R inattention Restrictions Weight Bearing Restrictions: No  Pain:   Continues to have sensitivity in R foot/ankle when donning TED hose despite therapist performing this very carefully to avoid pain.  Therapy/Group: Individual Therapy  Carly M Pippin, PT, DPT 01/27/2020, 7:52 AM   

## 2020-01-28 ENCOUNTER — Inpatient Hospital Stay (HOSPITAL_COMMUNITY): Payer: 59

## 2020-01-28 ENCOUNTER — Inpatient Hospital Stay (HOSPITAL_COMMUNITY): Payer: 59 | Admitting: Occupational Therapy

## 2020-01-28 ENCOUNTER — Inpatient Hospital Stay (HOSPITAL_COMMUNITY): Payer: 59 | Admitting: Speech Pathology

## 2020-01-28 LAB — GLUCOSE, CAPILLARY
Glucose-Capillary: 164 mg/dL — ABNORMAL HIGH (ref 70–99)
Glucose-Capillary: 162 mg/dL — ABNORMAL HIGH (ref 70–99)
Glucose-Capillary: 146 mg/dL — ABNORMAL HIGH (ref 70–99)
Glucose-Capillary: 231 mg/dL — ABNORMAL HIGH (ref 70–99)

## 2020-01-28 LAB — CBC
HCT: 39.6 % (ref 36.0–46.0)
Hemoglobin: 13.3 g/dL (ref 12.0–15.0)
MCH: 29 pg (ref 26.0–34.0)
MCHC: 33.6 g/dL (ref 30.0–36.0)
MCV: 86.5 fL (ref 80.0–100.0)
Platelets: 324 10*3/uL (ref 150–400)
RBC: 4.58 MIL/uL (ref 3.87–5.11)
RDW: 13.5 % (ref 11.5–15.5)
WBC: 9.8 10*3/uL (ref 4.0–10.5)
nRBC: 0 % (ref 0.0–0.2)

## 2020-01-28 MED ORDER — SALINE SPRAY 0.65 % NA SOLN
1.0000 | NASAL | Status: DC | PRN
  Filled 2020-01-28: qty 44

## 2020-01-28 NOTE — Progress Notes (Signed)
Occupational Therapy Session Note  Patient Details  Name: Robin [name] MRN: [id number] Date of Birth: 1965-09-02  Today's Date: 01/28/2020 OT Individual Time: 0733-0800 OT Individual Time Calculation (min): 27 min    Short Term Goals: Week 4:  OT Short Term Goal 1 (Week 4): Continue working on established LTGs set a min assist level overall.  Skilled Therapeutic Interventions/Progress Updates:    Pt greeted semi-reclined in bed with daughter present. Pt eating breakfast. OT brought HOB up higher to facilitate more upright posture better for swallowing. Educated on use of R hand as a stabilizer,, but needed hand over hand A to get it to the tray. Pt able to manipulate feeding utensil with L hand appropriately. Pt declined to get OOB to wc at this time. Pts daughter got out her clothes for next OT session where she will have time to bathe and dress. OT worked on R had flex/ext. Issued pt squeeze ball and pt completed 3 sets of 10 squeezes. Pt instructed to continue working on grip by doing 10 squeezes during commercials while watching TV. Pt left with bed alarm on, call bell in reach, and needs met.   Therapy Documentation Precautions:  Precautions Precautions: Fall Precaution Comments: R hemiparesis, R inattention Restrictions Weight Bearing Restrictions: No Pain:  denies pain   Therapy/Group: Individual Therapy  Valma Cava 01/28/2020, 8:02 AM

## 2020-01-28 NOTE — Progress Notes (Signed)
[facility] PHYSICAL MEDICINE & REHABILITATION PROGRESS NOTE   Subjective/Complaints:  Incont of bowel , reviewed therapy schedule, pt smiling, remains severely aphasic   ROS: limited due to language/communication   Objective:   No results found. No results for input(s): WBC, HGB, HCT, PLT in the last 72 hours. Recent Labs    01/26/20 0549  NA 137  K 4.2  CL 102  CO2 24  GLUCOSE 185*  BUN 18  CREATININE 1.08*  CALCIUM 9.5    Intake/Output Summary (Last 24 hours) at 01/28/2020 0858 Last data filed at 01/28/2020 0800 Gross per 24 hour  Intake 516 ml  Output --  Net 516 ml     Physical Exam: Vital Signs Blood pressure 135/88, pulse 68, temperature 98.3 F (36.8 C), temperature source Oral, resp. rate 16, weight 96 kg, last menstrual period 08/21/2011, SpO2 (!) 75 %.   General: No acute distress Mood and affect are appropriate Heart: Regular rate and rhythm no rubs murmurs or extra sounds Lungs: Clear to auscultation, breathing unlabored, no rales or wheezes Abdomen: Positive bowel sounds, soft nontender to palpation, nondistended Extremities: No clubbing, cyanosis, or edema Skin: No evidence of breakdown, no evidence of rash  intact, motor strength is grossly 4- 5/5 in left . 3-/5 RIght  deltoid, bicep, tricep, grip, 5/5 left  3-/5 right hip flexor, knee extensors, ankle dorsiflexor and plantar flexor,2- right hip ext     Musculoskeletal:No right ankle swelling, no TTP over ATFL, mild pain with passive supination RIght ankle   Assessment/Plan: 1. Functional deficits secondary to Cardioembolic Left MCA infarct  which require 3+ hours per day of interdisciplinary therapy in a comprehensive inpatient rehab setting.  Physiatrist is providing close team supervision and 24 hour management of active medical problems listed below.  Physiatrist and rehab team continue to assess barriers to discharge/monitor patient progress toward functional and medical goals  Care  Tool:  Bathing    Body parts bathed by patient: Right arm, Chest, Abdomen, Front perineal area, Right upper leg, Left upper leg, Face, Left lower leg, Right lower leg, Buttocks   Body parts bathed by helper: Left arm     Bathing assist Assist Level: Minimal Assistance - Patient > 75%     Upper Body Dressing/Undressing Upper body dressing   What is the patient wearing?: Pull over shirt    Upper body assist Assist Level: Minimal Assistance - Patient > 75%    Lower Body Dressing/Undressing Lower body dressing      What is the patient wearing?: Incontinence brief, Pants     Lower body assist Assist for lower body dressing: Moderate Assistance - Patient 50 - 74%     Toileting Toileting    Toileting assist Assist for toileting: Moderate Assistance - Patient 50 - 74%     Transfers Chair/bed transfer  Transfers assist  Chair/bed transfer activity did not occur: Safety/medical concerns  Chair/bed transfer assist level: Moderate Assistance - Patient 50 - 74% Chair/bed transfer assistive device: Programmer, multimedia   Ambulation assist   Ambulation activity did not occur: Safety/medical concerns  Assist level: Moderate Assistance - Patient 50 - 74% Assistive device: Hand held assist Max distance: 45'   Walk 10 feet activity   Assist  Walk 10 feet activity did not occur: Safety/medical concerns  Assist level: 2 helpers Assistive device: Walker-rolling   Walk 50 feet activity   Assist Walk 50 feet with 2 turns activity did not occur: Safety/medical concerns (not yet making turns)  Walk 150 feet activity   Assist Walk 150 feet activity did not occur: Safety/medical concerns         Walk 10 feet on uneven surface  activity   Assist Walk 10 feet on uneven surfaces activity did not occur: Safety/medical concerns         Wheelchair     Assist Will patient use wheelchair at discharge?:  (TBD)             Wheelchair  50 feet with 2 turns activity    Assist            Wheelchair 150 feet activity     Assist      Assist Level: Supervision/Verbal cueing   Blood pressure 135/88, pulse 68, temperature 98.3 F (36.8 C), temperature source Oral, resp. rate 16, weight 96 kg, last menstrual period 08/21/2011, SpO2 (!) 75 %.  Medical Problem List and Plan: 1.  Right-sided hemiparesis and aphasia secondary to left MCA infarction/petechial hemorrhage with saccular aneurysm at the paraclinoid right ICA status post mechanical thrombectomy  Continue CIR PT, OT, SLP   WHO/PRAFO qhs  2.  Antithrombotics: -DVT/anticoagulation: Eliquis             -antiplatelet therapy: N/A 3. Pain Management: Neurontin 100 mg every 8 hours, Zanaflex 2 mg twice daily as needed  Controlled 6/25.              Monitor with increased exertion 4. Mood: Provide emotional support- cannot assess accurately related to severe aphasia, empirically started on citalopram for "flat affect"  Which may be MCA infarct effect              -antipsychotic agents: N/A 5. Neuropsych: This patient is not capable of making decisions on her own behalf. 6. Skin/Wound Care: Routine skin checks 7. Fluids/Electrolytes/Nutrition: Routine in and outs.  Oral intake improving 75-100% will Drinking  fluids  951ml 6/23 8.  Post stroke dysphagia.    Dysphagia #1 pudding thick liquids.  Follow-up speech therapy.  Advance diet as tolerated 9.  Hypertension.  Coreg 50 mg twice daily,Bidil 20-37.5 mg 2 tabs 3 times daily            Vitals:   01/28/20 0345 01/28/20 0403  BP:  135/88  Pulse: 69 68  Resp: 16   Temp: 98.3 F (36.8 C)   SpO2: 97% (!) 75%   6/25 controlled   10.  Atrial fibrillation.  Lanoxin 0.125 mg daily.  Cardiac rate controlled- on Eliquis 11.  Chronic diastolic congestive heart failure.  Demadex 20 mg daily.  Monitor for any signs of fluid overload Filed Weights   01/27/20 0532 01/27/20 1118 01/28/20 0345  Weight: 96 kg 96.6 kg 96  kg   Stable weights, overall fluid intake reduced now that TF is off   6/25: relatively stable 12.  Diabetes mellitus with peripheral neuropathy.  Hemoglobin A1c 11.6.  NovoLog 2 units every 4 hours, Lantus insulin 60 units nightly, Glucophage 500 mg twice daily CBG (last 3)  Recent Labs    01/27/20 1729 01/27/20 2113 01/28/20 0609  GLUCAP 256* 284* 164*   .   6/20: am CBG ok on Lantus  72U.  Metformin 1000mg  BID started 6/24- monitor for now               Monitor with increased mobility 13.  COPD with tobacco abuse.  Continue nebulizer treatments 14.  Polysubstance abuse.  Urine drug screen positive cocaine.    Counsel when appropriate  15.  Hyperlipidemia.  Crestor 16.  GERD.  Protonix 17.  AKI Creat ok mainly prerenal azotemia with elevated BUN - no need to hold metformin at this time  18. RIght  ATFL sprain, order ankle splint, may use Ice , Remove at noc  19.  Urinary retention may have diabetic cystopathy  Improved  Off urecholine and flomax    LOS: 23 days A FACE TO FACE EVALUATION WAS PERFORMED  Charlett Blake 01/28/2020, 8:58 AM

## 2020-01-28 NOTE — Progress Notes (Signed)
Patient ID: Robin [name], female   DOB: 01/20/1966, 54 y.o.   MRN: [id number]   Family will not be able to attend family education scheduled for Sunday.

## 2020-01-28 NOTE — Progress Notes (Signed)
Speech Language Pathology Daily Session Note  Patient Details  Name: Robin [name] MRN: [id number] Date of Birth: [birth year]  Today's Date: 01/28/2020 SLP Individual Time: 6047-9987 SLP Individual Time Calculation (min): 40 min  Short Term Goals: Week 4: SLP Short Term Goal 1 (Week 4): STG=LTG due to remaining length of stay  Skilled Therapeutic Interventions: Pt was seen for skilled ST targeting communication. Pt with large nose bleed upon arrival; spoke with RN and PA who took action to care for the bleed. However, as a result of active bleeding pt was somewhat limited in ability to participate in session. SLP primarily provided Min A verbal cues and assistance in phrasing questions so pt could provide yes/no answers and make choices regarding care of her nose and preferences for comfort. Pt also made requests via gestures (pointing and eye gaze) as well as approximations of words to communicate nursing needs. Pt left sitting in chair with alarm set and RN present. Continue per current plan of care.          Pain Pain Assessment Pain Scale: Faces Pain Score: 0-No pain Faces Pain Scale: Hurts little more Pain Type: Acute pain Pain Location: Nose Pain Orientation: Right;Left Pain Descriptors / Indicators: Grimacing Pain Onset: On-going Pain Intervention(s): RN made aware Multiple Pain Sites: No  Therapy/Group: Individual Therapy  Arbutus Leas 01/28/2020, 5:54 AM

## 2020-01-28 NOTE — Progress Notes (Signed)
Physical Therapy Session Note  Patient Details  Name: Robin [name] MRN: [id number] Date of Birth: 05-Jul-1966  Today's Date: 01/28/2020 PT Missed Time: 29 Minutes Missed Time Reason: Patient unwilling to participate (Nosebleed)  Short Term Goals: Week 3:  PT Short Term Goal 1 (Week 3): Pt will perform supine<>sit with min assist PT Short Term Goal 1 - Progress (Week 3): Met PT Short Term Goal 2 (Week 3): Pt will ambulate at least 60f using LRAD with mod assist of 1 and +2 w/c follow if needed PT Short Term Goal 2 - Progress (Week 3): Met PT Short Term Goal 3 (Week 3): Pt will initiate stair navigation PT Short Term Goal 3 - Progress (Week 3): Progressing toward goal  Skilled Therapeutic Interventions/Progress Updates:     Pt misses 60 minutes of skilled PT, refusing due to nose bleed. PT will continue to follow.  Therapy Documentation Precautions:  Precautions Precautions: Fall Precaution Comments: R hemiparesis, R inattention Restrictions Weight Bearing Restrictions: No    Therapy/Group: Individual Therapy  WBreck Coons PT, DPT 01/28/2020, 1:45 PM

## 2020-01-28 NOTE — Progress Notes (Signed)
Occupational Therapy Session Note  Patient Details  Name: Robin [name] MRN: [id number] Date of Birth: Mar 16, 1966  Today's Date: 01/28/2020 OT Individual Time: 2353-6144 OT Individual Time Calculation (min): 72 min    Short Term Goals: Week 4:  OT Short Term Goal 1 (Week 4): Continue working on established LTGs set a min assist level overall.  Skilled Therapeutic Interventions/Progress Updates:    Pt completed shower and dressing during session.  She was able to ambulate from the EOB to the shower with mod assist using the RW with hand splint on the right for support.  Once in the shower she was able to complete bathing sit to stand with min assist and min instructional cueing.  Mod assist was needed for integration of the RUE as an active assist for washing the LUE.  She transferred out to the wheelchair at the sink with mod assist using the walker for dressing.  She needed mod assist with mod demonstrational cueing for completion of UB dressing as well as mod assist sit to stand for LB dressing.  Noted pt with nose bleed X2 during session with nursing made aware.  She finished session sitting in the wheelchair with call button and phone in reach.    Therapy Documentation Precautions:  Precautions Precautions: Fall Precaution Comments: R hemiparesis, R inattention Restrictions Weight Bearing Restrictions: No  Pain: Pain Assessment Pain Scale: Faces Pain Score: 0-No pain Faces Pain Scale: Hurts little more Pain Type: Acute pain Pain Location: Nose Pain Orientation: Right;Left Pain Descriptors / Indicators: Grimacing Pain Onset: On-going Pain Intervention(s): RN made aware Multiple Pain Sites: No ADL: See Care Tool Section for some details of mobility and selfcare  Therapy/Group: Individual Therapy  Robin [name] OTR/L 01/28/2020, 12:25 PM

## 2020-01-29 ENCOUNTER — Inpatient Hospital Stay (HOSPITAL_COMMUNITY): Payer: 59

## 2020-01-29 ENCOUNTER — Inpatient Hospital Stay (HOSPITAL_COMMUNITY): Payer: 59 | Admitting: Occupational Therapy

## 2020-01-29 LAB — BASIC METABOLIC PANEL
Anion gap: 14 (ref 5–15)
BUN: 22 mg/dL — ABNORMAL HIGH (ref 6–20)
CO2: 19 mmol/L — ABNORMAL LOW (ref 22–32)
Calcium: 9.6 mg/dL (ref 8.9–10.3)
Chloride: 102 mmol/L (ref 98–111)
Creatinine, Ser: 1.03 mg/dL — ABNORMAL HIGH (ref 0.44–1.00)
GFR calc Af Amer: >60 mL/min (ref >60)
GFR calc non Af Amer: >60 mL/min (ref >60)
Glucose, Bld: 289 mg/dL — ABNORMAL HIGH (ref 70–99)
Potassium: 4 mmol/L (ref 3.5–5.1)
Sodium: 135 mmol/L (ref 135–145)

## 2020-01-29 LAB — URINALYSIS, ROUTINE W REFLEX MICROSCOPIC
Bilirubin Urine: NEGATIVE
Glucose, UA: 50 mg/dL — AB
Ketones, ur: NEGATIVE mg/dL
Nitrite: NEGATIVE
Protein, ur: NEGATIVE mg/dL
Specific Gravity, Urine: 1.006 (ref 1.005–1.030)
WBC, UA: >50 WBC/hpf — ABNORMAL HIGH (ref 0–5)
pH: 5 (ref 5.0–8.0)

## 2020-01-29 LAB — CBC
HCT: 41.2 % (ref 36.0–46.0)
Hemoglobin: 13.6 g/dL (ref 12.0–15.0)
MCH: 28.6 pg (ref 26.0–34.0)
MCHC: 33 g/dL (ref 30.0–36.0)
MCV: 86.7 fL (ref 80.0–100.0)
Platelets: 265 10*3/uL (ref 150–400)
RBC: 4.75 MIL/uL (ref 3.87–5.11)
RDW: 13.3 % (ref 11.5–15.5)
WBC: 9.8 10*3/uL (ref 4.0–10.5)
nRBC: 0 % (ref 0.0–0.2)

## 2020-01-29 LAB — GLUCOSE, CAPILLARY
Glucose-Capillary: 207 mg/dL — ABNORMAL HIGH (ref 70–99)
Glucose-Capillary: 289 mg/dL — ABNORMAL HIGH (ref 70–99)
Glucose-Capillary: 212 mg/dL — ABNORMAL HIGH (ref 70–99)
Glucose-Capillary: 209 mg/dL — ABNORMAL HIGH (ref 70–99)

## 2020-01-29 MED ORDER — SODIUM CHLORIDE 0.9 % IV SOLN
1.0000 g | INTRAVENOUS | Status: DC
  Administered 2020-01-29 – 2020-02-01 (×4): 1 g via INTRAVENOUS
  Filled 2020-01-29: qty 0.05
  Filled 2020-01-29: qty 10
  Filled 2020-01-29 (×3): qty 1

## 2020-01-29 NOTE — Progress Notes (Signed)
[facility] PHYSICAL MEDICINE & REHABILITATION PROGRESS NOTE   Subjective/Complaints:  Pt very sedate-d more than normal per therapy- was unable to work with therapy today.   Did have large incontinent BM  In early PM.    Due to sedation, checked U/A and CBC/BMP- has UTI based on results of U/A- is allergic to PCN- "mild" reaction.   Of note, although no leukocytosis, pt has severe increase in BGs today.  Will start rocephin for presumed UTI.     ROS: limited due to sedation  Objective:   No results found. Recent Labs    01/28/20 1215 01/29/20 1302  WBC 9.8 9.8  HGB 13.3 13.6  HCT 39.6 41.2  PLT 324 265   Recent Labs    01/29/20 1302  NA 135  K 4.0  CL 102  CO2 19*  GLUCOSE 289*  BUN 22*  CREATININE 1.03*  CALCIUM 9.6    Intake/Output Summary (Last 24 hours) at 01/29/2020 1432 Last data filed at 01/29/2020 1428 Gross per 24 hour  Intake 378 ml  Output 50 ml  Net 328 ml     Physical Exam: Vital Signs Blood pressure 121/83, pulse (!) 103, temperature 99.3 F (37.4 C), temperature source Oral, resp. rate 17, weight 95.7 kg, last menstrual period 08/21/2011, SpO2 99 %.   General: supine- in bed; sleepy- woke to say the word bowel but unable to say anything else- was repeating US; VERY sedated, NAD sedated Heart: tachycardic- regular rhythm Lungs: CTA B/L- no W/R/R- good air movement Abdomen: Soft, NT, ND, (+)BS  Extremities: No clubbing, cyanosis, or edema Skin: No evidence of breakdown, no evidence of rash  intact, motor strength is grossly 4- 5/5 in left . 3-/5 RIght  deltoid, bicep, tricep, grip, 5/5 left  3-/5 right hip flexor, knee extensors, ankle dorsiflexor and plantar flexor,2- right hip ext     Musculoskeletal:No right ankle swelling, no TTP over ATFL, mild pain with passive supination RIght ankle   Assessment/Plan: 1. Functional deficits secondary to Cardioembolic Left MCA infarct  which require 3+ hours per day of interdisciplinary therapy  in a comprehensive inpatient rehab setting.  Physiatrist is providing close team supervision and 24 hour management of active medical problems listed below.  Physiatrist and rehab team continue to assess barriers to discharge/monitor patient progress toward functional and medical goals  Care Tool:  Bathing    Body parts bathed by patient: Right arm, Chest, Abdomen, Front perineal area, Right upper leg, Left upper leg, Face, Left lower leg, Right lower leg, Buttocks   Body parts bathed by helper: Left arm     Bathing assist Assist Level: Minimal Assistance - Patient > 75%     Upper Body Dressing/Undressing Upper body dressing   What is the patient wearing?: Pull over shirt    Upper body assist Assist Level: Minimal Assistance - Patient > 75%    Lower Body Dressing/Undressing Lower body dressing      What is the patient wearing?: Incontinence brief, Pants     Lower body assist Assist for lower body dressing: Moderate Assistance - Patient 50 - 74%     Toileting Toileting    Toileting assist Assist for toileting: Moderate Assistance - Patient 50 - 74%     Transfers Chair/bed transfer  Transfers assist  Chair/bed transfer activity did not occur: Safety/medical concerns  Chair/bed transfer assist level: Moderate Assistance - Patient 50 - 74% Chair/bed transfer assistive device: Programmer, multimedia   Ambulation assist  Ambulation activity did not occur: Safety/medical concerns  Assist level: Moderate Assistance - Patient 50 - 74% Assistive device: Hand held assist Max distance: 45'   Walk 10 feet activity   Assist  Walk 10 feet activity did not occur: Safety/medical concerns  Assist level: 2 helpers Assistive device: Walker-rolling   Walk 50 feet activity   Assist Walk 50 feet with 2 turns activity did not occur: Safety/medical concerns (not yet making turns)         Walk 150 feet activity   Assist Walk 150 feet activity did not  occur: Safety/medical concerns         Walk 10 feet on uneven surface  activity   Assist Walk 10 feet on uneven surfaces activity did not occur: Safety/medical concerns         Wheelchair     Assist Will patient use wheelchair at discharge?:  (TBD)             Wheelchair 50 feet with 2 turns activity    Assist            Wheelchair 150 feet activity     Assist      Assist Level: Supervision/Verbal cueing   Blood pressure 121/83, pulse (!) 103, temperature 99.3 F (37.4 C), temperature source Oral, resp. rate 17, weight 95.7 kg, last menstrual period 08/21/2011, SpO2 99 %.  Medical Problem List and Plan: 1.  Right-sided hemiparesis and aphasia secondary to left MCA infarction/petechial hemorrhage with saccular aneurysm at the paraclinoid right ICA status post mechanical thrombectomy  Continue CIR PT, OT, SLP   WHO/PRAFO qhs  2.  Antithrombotics: -DVT/anticoagulation: Eliquis             -antiplatelet therapy: N/A 3. Pain Management: Neurontin 100 mg every 8 hours, Zanaflex 2 mg twice daily as needed  6/26- no complaints              Monitor with increased exertion 4. Mood: Provide emotional support- cannot assess accurately related to severe aphasia, empirically started on citalopram for "flat affect"  Which may be MCA infarct effect              -antipsychotic agents: N/A 5. Neuropsych: This patient is not capable of making decisions on her own behalf. 6. Skin/Wound Care: Routine skin checks 7. Fluids/Electrolytes/Nutrition: Routine in and outs.  Oral intake improving 75-100% will Drinking  fluids  974ml 6/23 8.  Post stroke dysphagia.    Dysphagia #1 pudding thick liquids.  Follow-up speech therapy.  6/26- has NGT in place- sedated so not drinking- will recheck labs in AM and see if dehydrated due to sedation- will monitor  Advance diet as tolerated 9.  Hypertension.  Coreg 50 mg twice daily,Bidil 20-37.5 mg 2 tabs 3 times daily             Vitals:   01/29/20 0931 01/29/20 1105  BP: (!) 172/104 121/83  Pulse: 100 (!) 103  Resp: 18 17  Temp: 98.5 F (36.9 C) 99.3 F (37.4 C)  SpO2: 96% 99%   6/26- BP labile- could be due to UTI/sedation-  10.  Atrial fibrillation.  Lanoxin 0.125 mg daily.  Cardiac rate controlled- on Eliquis 11.  Chronic diastolic congestive heart failure.  Demadex 20 mg daily.  Monitor for any signs of fluid overload Filed Weights   01/27/20 1118 01/28/20 0345 01/29/20 0523  Weight: 96.6 kg 96 kg 95.7 kg   Stable weights, overall fluid intake reduced now that TF is off  6/26- stable/lower- con't regimen 12.  Diabetes mellitus with peripheral neuropathy.  Hemoglobin A1c 11.6.  NovoLog 2 units every 4 hours, Lantus insulin 60 units nightly, Glucophage 500 mg twice daily CBG (last 3)  Recent Labs    01/28/20 2140 01/29/20 0604 01/29/20 1134  GLUCAP 231* 207* 289*   .   6/20: am CBG ok on Lantus  72U.  Metformin 1000mg  BID started 6/24- monitor for now   6/26- BGs elevated- likely due to UTI- con't regimen and do SSI as needed right now              Monitor with increased mobility 13.  COPD with tobacco abuse.  Continue nebulizer treatments 14.  Polysubstance abuse.  Urine drug screen positive cocaine.    Counsel when appropriate 15.  Hyperlipidemia.  Crestor 16.  GERD.  Protonix 17.  AKI Creat ok mainly prerenal azotemia with elevated BUN - no need to hold metformin at this time  18. RIght  ATFL sprain, order ankle splint, may use Ice , Remove at noc  19.  Urinary retention may have diabetic cystopathy  Improved  Off urecholine and flomax  20. UTI  6/26- will add Rocephin- PCN "allergy is nausea" per notes-will monitor for side effects- U Cx pending.     LOS: 24 days A FACE TO FACE EVALUATION WAS PERFORMED  Jimmey Hengel 01/29/2020, 2:32 PM

## 2020-01-29 NOTE — Progress Notes (Signed)
Patient very lethargic this morning, unable to participate in any activity, will respond to voice and go back to sleep. V/S stable but FSBA high. MD notified, she order CBC, BMP and U/A. U/A positive, MD notified about the result and she ordered IV rocephin and order carried out per protocol. Patient A&O, having conversation with sister. We continue to monitor.

## 2020-01-29 NOTE — Progress Notes (Signed)
Occupational Therapy Note  Patient Details  Name: Robin [name] MRN: [id number] Date of Birth: [birth year]  Today's Date: 01/29/2020 OT Missed Time: 41 Minutes Missed Time Reason: Patient fatigue;Patient unwilling/refused to participate without medical reason  Pt missed 30 min OT due to refusal and fatigue. OT provided warm and cool wash cloth to facilitated alertness, offered breakfast, and repositioning, however pt remained with eyes closed and stating "no."   Adan Beal, Quillian Quince 01/29/2020, 9:13 AM

## 2020-01-29 NOTE — Progress Notes (Signed)
Occupational Therapy Note  Patient Details  Name: Robin [name] MRN: [id number] Date of Birth: 08-Sep-1965  Today's Date: 01/29/2020 OT Missed Time: 60 Minutes Missed Time Reason: Patient fatigue;Patient unwilling/refused to participate without medical reason  Pt scheduled for a 30 min session at 2pm. Pt in bed sound asleep.  Used several methods to wake pt, she mumbled hello but then kept mumbling no, no, no.  Removed hand splint to rub her hand and to start PROM but she kept saying no and pulled the covers over her arm.  Tried 2 x to slide covers of her legs to encourage her to sit up but she grabbed the covers and pulled them back over her.  Pt kept eyes closes and frequently snored.  Pt continuing to rest in bed with all needs met.    [place] 01/29/2020, 2:21 PM

## 2020-01-30 ENCOUNTER — Inpatient Hospital Stay (HOSPITAL_COMMUNITY): Payer: 59 | Admitting: Occupational Therapy

## 2020-01-30 LAB — BASIC METABOLIC PANEL
Anion gap: 12 (ref 5–15)
BUN: 22 mg/dL — ABNORMAL HIGH (ref 6–20)
CO2: 20 mmol/L — ABNORMAL LOW (ref 22–32)
Calcium: 9.4 mg/dL (ref 8.9–10.3)
Chloride: 104 mmol/L (ref 98–111)
Creatinine, Ser: 1.07 mg/dL — ABNORMAL HIGH (ref 0.44–1.00)
GFR calc Af Amer: >60 mL/min (ref >60)
GFR calc non Af Amer: 59 mL/min — ABNORMAL LOW (ref >60)
Glucose, Bld: 159 mg/dL — ABNORMAL HIGH (ref 70–99)
Potassium: 4 mmol/L (ref 3.5–5.1)
Sodium: 136 mmol/L (ref 135–145)

## 2020-01-30 LAB — CBC WITH DIFFERENTIAL/PLATELET
Abs Immature Granulocytes: 0.02 10*3/uL (ref 0.00–0.07)
Basophils Absolute: 0.1 10*3/uL (ref 0.0–0.1)
Basophils Relative: 1 %
Eosinophils Absolute: 0.1 10*3/uL (ref 0.0–0.5)
Eosinophils Relative: 1 %
HCT: 40.8 % (ref 36.0–46.0)
Hemoglobin: 13.2 g/dL (ref 12.0–15.0)
Immature Granulocytes: 0 %
Lymphocytes Relative: 33 %
Lymphs Abs: 3 10*3/uL (ref 0.7–4.0)
MCH: 28.1 pg (ref 26.0–34.0)
MCHC: 32.4 g/dL (ref 30.0–36.0)
MCV: 87 fL (ref 80.0–100.0)
Monocytes Absolute: 0.9 10*3/uL (ref 0.1–1.0)
Monocytes Relative: 10 %
Neutro Abs: 4.9 10*3/uL (ref 1.7–7.7)
Neutrophils Relative %: 55 %
Platelets: 242 10*3/uL (ref 150–400)
RBC: 4.69 MIL/uL (ref 3.87–5.11)
RDW: 13.5 % (ref 11.5–15.5)
WBC: 8.8 10*3/uL (ref 4.0–10.5)
nRBC: 0 % (ref 0.0–0.2)

## 2020-01-30 LAB — GLUCOSE, CAPILLARY
Glucose-Capillary: 145 mg/dL — ABNORMAL HIGH (ref 70–99)
Glucose-Capillary: 138 mg/dL — ABNORMAL HIGH (ref 70–99)
Glucose-Capillary: 199 mg/dL — ABNORMAL HIGH (ref 70–99)
Glucose-Capillary: 226 mg/dL — ABNORMAL HIGH (ref 70–99)

## 2020-01-30 MED ORDER — NITROGLYCERIN 0.4 MG SL SUBL
SUBLINGUAL_TABLET | SUBLINGUAL | Status: AC
  Filled 2020-01-30: qty 1

## 2020-01-30 MED ORDER — NITROGLYCERIN 0.4 MG SL SUBL: 0.4000 mg | SUBLINGUAL_TABLET | SUBLINGUAL | Status: DC | PRN

## 2020-01-30 MED ORDER — ASPIRIN 81 MG PO CHEW: 324.0000 mg | CHEWABLE_TABLET | Freq: Once | ORAL | Status: AC

## 2020-01-30 MED ORDER — ASPIRIN 81 MG PO CHEW
CHEWABLE_TABLET | ORAL | Status: AC
  Administered 2020-01-30: 324 mg via ORAL
  Filled 2020-01-30: qty 4

## 2020-01-30 NOTE — Progress Notes (Signed)
This RN was on break when he was told that patient is complaining of chest pain, this RN went and assess patient, she was A&O, unable to describe pain but was able to point to pain location. She had 2 yellow MEWS for increased HR. MD notified and RR called. MD came assessed patient and she ordered EKG. EKG done result given to MD, MD ordered tylenol for pain and ASA, order carried out per protocol. Patient stable and sleeping. We continue to monitor.

## 2020-01-30 NOTE — Progress Notes (Signed)
Occupational Therapy Session Note  Patient Details  Name: Angelamarie Avakian Sizelove MRN: [id number] Date of Birth: [birth year]  Today's Date: 01/30/2020 OT Individual Time: 1300-1353 OT Individual Time Calculation (min): 53 min    Skilled Therapeutic Interventions/Progress Updates:    Pt greeted in bed, appearing alert, BM leaking out of brief and soiling bed linen. +2 assist for hygiene, brief change, and bed change bedlevel, pt rolling Rt>Lt with Mod A. Afterwards, pt refused OOB activity, stated "no" and exhibited resistance to gentle facilitation towards EOB. Pt expressing that she was too tired to participate. OT facilitated active assist activity bedlevel to work on NMR of the Rt arm, pt participating with encouragement and cold wash cloth to face at times to increase alertness. Assisted her with applying lotion to Rt arm due to skin dryness. Pt then held up her Lt arm, and kept her eyes closed, shaking head when encouraged to participate further. OT donned resting hand splint and left pt with all needs within reach and bed alarm set.     Therapy Documentation Precautions:  Precautions Precautions: Fall Precaution Comments: R hemiparesis, R inattention Restrictions Weight Bearing Restrictions: No Vital Signs: Therapy Vitals Temp: 99.7 F (37.6 C) Temp Source: Oral Pulse Rate: 88 Resp: 16 BP: 123/76 Patient Position (if appropriate): Lying Oxygen Therapy SpO2: 99 % O2 Device: Room Air Pain: no s/s pain during tx   ADL: ADL Grooming: Maximal assistance Where Assessed-Grooming: Wheelchair Upper Body Bathing: Maximal assistance Where Assessed-Upper Body Bathing: Wheelchair Lower Body Bathing: Other (comment) (total +2) Where Assessed-Lower Body Bathing: Other (Comment), Wheelchair Upper Body Dressing: Dependent Where Assessed-Upper Body Dressing: Wheelchair, Sitting at sink Lower Body Dressing: Other (Comment) (total +2) Where Assessed-Lower Body Dressing: Wheelchair, Sitting at  sink, Standing at sink Toileting: Other (Comment) (total +2) Where Assessed-Toileting: Bedside Commode Toilet Transfer: Other (comment) (total +2) Toilet Transfer Method: Other (comment) (Stedy +2 assist)     Therapy/Group: Individual Therapy  Maylin [place] A Arrabella Westerman 01/30/2020, 4:05 PM

## 2020-01-30 NOTE — Progress Notes (Signed)
[place] PHYSICAL MEDICINE & REHABILITATION PROGRESS NOTE   Subjective/Complaints:  Pt much less sedated for me today- told me she was sleepy, but was able to wake up to verbal/tactile stimuli and speak small amount to me-   Nursing also reported ate some of breakfast    ROS: limited due to cognition  Objective:   No results found. Recent Labs    01/29/20 1302 01/30/20 0519  WBC 9.8 8.8  HGB 13.6 13.2  HCT 41.2 40.8  PLT 265 242   Recent Labs    01/29/20 1302 01/30/20 0519  NA 135 136  K 4.0 4.0  CL 102 104  CO2 19* 20*  GLUCOSE 289* 159*  BUN 22* 22*  CREATININE 1.03* 1.07*  CALCIUM 9.6 9.4    Intake/Output Summary (Last 24 hours) at 01/30/2020 1341 Last data filed at 01/30/2020 0730 Gross per 24 hour  Intake 450 ml  Output 50 ml  Net 400 ml     Physical Exam: Vital Signs Blood pressure (!) 130/96, pulse 80, temperature 98.7 F (37.1 C), temperature source Oral, resp. rate 16, height 5\' 6"  (1.676 m), weight 94.5 kg, last menstrual period 08/21/2011, SpO2 100 %.   General: sitting up in bed- drowsy, but watching some TV, appropriate, NAD HENT: NGT in place- kept rubbing at it Heart: RRR Lungs: CTA B/L- no W/R/R- good air movement Abdomen: Soft, NT, ND, (+)BS  Extremities: No clubbing, cyanosis, or edema Skin: No evidence of breakdown, no evidence of rash  intact, motor strength is grossly 4- 5/5 in left . 3-/5 RIght  deltoid, bicep, tricep, grip, 5/5 left  3-/5 right hip flexor, knee extensors, ankle dorsiflexor and plantar flexor,2- right hip ext     Musculoskeletal:No right ankle swelling, no TTP over ATFL, mild pain with passive supination RIght ankle   Assessment/Plan: 1. Functional deficits secondary to Cardioembolic Left MCA infarct  which require 3+ hours per day of interdisciplinary therapy in a comprehensive inpatient rehab setting.  Physiatrist is providing close team supervision and 24 hour management of active medical problems listed  below.  Physiatrist and rehab team continue to assess barriers to discharge/monitor patient progress toward functional and medical goals  Care Tool:  Bathing    Body parts bathed by patient: Right arm, Chest, Abdomen, Front perineal area, Right upper leg, Left upper leg, Face, Left lower leg, Right lower leg, Buttocks   Body parts bathed by helper: Left arm     Bathing assist Assist Level: Minimal Assistance - Patient > 75%     Upper Body Dressing/Undressing Upper body dressing   What is the patient wearing?: Pull over shirt    Upper body assist Assist Level: Minimal Assistance - Patient > 75%    Lower Body Dressing/Undressing Lower body dressing      What is the patient wearing?: Incontinence brief, Pants     Lower body assist Assist for lower body dressing: Moderate Assistance - Patient 50 - 74%     Toileting Toileting    Toileting assist Assist for toileting: Moderate Assistance - Patient 50 - 74%     Transfers Chair/bed transfer  Transfers assist  Chair/bed transfer activity did not occur: Safety/medical concerns  Chair/bed transfer assist level: Moderate Assistance - Patient 50 - 74% Chair/bed transfer assistive device: Programmer, multimedia   Ambulation assist   Ambulation activity did not occur: Safety/medical concerns  Assist level: Moderate Assistance - Patient 50 - 74% Assistive device: Hand held assist Max distance: 33'  Walk 10 feet activity   Assist  Walk 10 feet activity did not occur: Safety/medical concerns  Assist level: 2 helpers Assistive device: Walker-rolling   Walk 50 feet activity   Assist Walk 50 feet with 2 turns activity did not occur: Safety/medical concerns (not yet making turns)         Walk 150 feet activity   Assist Walk 150 feet activity did not occur: Safety/medical concerns         Walk 10 feet on uneven surface  activity   Assist Walk 10 feet on uneven surfaces activity did not  occur: Safety/medical concerns         Wheelchair     Assist Will patient use wheelchair at discharge?:  (TBD)             Wheelchair 50 feet with 2 turns activity    Assist            Wheelchair 150 feet activity     Assist      Assist Level: Supervision/Verbal cueing   Blood pressure (!) 130/96, pulse 80, temperature 98.7 F (37.1 C), temperature source Oral, resp. rate 16, height 5\' 6"  (1.676 m), weight 94.5 kg, last menstrual period 08/21/2011, SpO2 100 %.  Medical Problem List and Plan: 1.  Right-sided hemiparesis and aphasia secondary to left MCA infarction/petechial hemorrhage with saccular aneurysm at the paraclinoid right ICA status post mechanical thrombectomy  Continue CIR PT, OT, SLP   WHO/PRAFO qhs  2.  Antithrombotics: -DVT/anticoagulation: Eliquis             -antiplatelet therapy: N/A 3. Pain Management: Neurontin 100 mg every 8 hours, Zanaflex 2 mg twice daily as needed  6/26- no complaints              Monitor with increased exertion 4. Mood: Provide emotional support- cannot assess accurately related to severe aphasia, empirically started on citalopram for "flat affect"  Which may be MCA infarct effect              -antipsychotic agents: N/A 5. Neuropsych: This patient is not capable of making decisions on her own behalf. 6. Skin/Wound Care: Routine skin checks 7. Fluids/Electrolytes/Nutrition: Routine in and outs.  Oral intake improving 75-100% will Drinking  fluids  939ml 6/23 8.  Post stroke dysphagia.    Dysphagia #1 pudding thick liquids.  Follow-up speech therapy.  6/26- has NGT in place- sedated so not drinking- will recheck labs in AM and see if dehydrated due to sedation- will monitor  Advance diet as tolerated 9.  Hypertension.  Coreg 50 mg twice daily,Bidil 20-37.5 mg 2 tabs 3 times daily            Vitals:   01/30/20 0419 01/30/20 0724  BP: 130/90 (!) 130/96  Pulse: 95 80  Resp: 16   Temp: 98.7 F (37.1 C)   SpO2:  100%    6/27- BP much improved today- con't regimen  10.  Atrial fibrillation.  Lanoxin 0.125 mg daily.  Cardiac rate controlled- on Eliquis 11.  Chronic diastolic congestive heart failure.  Demadex 20 mg daily.  Monitor for any signs of fluid overload Filed Weights   01/28/20 0345 01/29/20 0523 01/30/20 0419  Weight: 96 kg 95.7 kg 94.5 kg   Stable weights, overall fluid intake reduced now that TF is off   6/27- weight down another kg- not sure if drinking, but Cr stable and BUN stable at 22- Cr 1.07 12.  Diabetes mellitus with peripheral  neuropathy.  Hemoglobin A1c 11.6.  NovoLog 2 units every 4 hours, Lantus insulin 60 units nightly, Glucophage 500 mg twice daily CBG (last 3)  Recent Labs    01/29/20 2118 01/30/20 0625 01/30/20 1143  GLUCAP 209* 145* 138*   .   6/20: am CBG ok on Lantus  72U.  Metformin 1000mg  BID started 6/24- monitor for now   6/26- BGs elevated- likely due to UTI- con't regimen and do SSI as needed right now  6/27- BGs MUCH better today- likely due to treating UTI- 138-209 (yesterday 200-300)              Monitor with increased mobility 13.  COPD with tobacco abuse.  Continue nebulizer treatments 14.  Polysubstance abuse.  Urine drug screen positive cocaine.    Counsel when appropriate 15.  Hyperlipidemia.  Crestor 16.  GERD.  Protonix 17.  AKI Creat ok mainly prerenal azotemia with elevated BUN - no need to hold metformin at this time  18. RIght  ATFL sprain, order ankle splint, may use Ice , Remove at noc  19.  Urinary retention may have diabetic cystopathy  Improved  Off urecholine and flomax  20. UTI  6/26- will add Rocephin- PCN "allergy is nausea" per notes-will monitor for side effects- U Cx pending.  6/27- U Cx shows 100k Klebsiella and 60k E Coli- sensitvities are PENDING  ; cont' Rocphin until results back.    LOS: 25 days A FACE TO FACE EVALUATION WAS PERFORMED  Robin [name] 01/30/2020, 1:41 PM

## 2020-01-31 ENCOUNTER — Inpatient Hospital Stay (HOSPITAL_COMMUNITY): Payer: 59

## 2020-01-31 ENCOUNTER — Encounter (HOSPITAL_COMMUNITY): Payer: 59 | Admitting: Occupational Therapy

## 2020-01-31 ENCOUNTER — Inpatient Hospital Stay (HOSPITAL_COMMUNITY): Payer: 59 | Admitting: Speech Pathology

## 2020-01-31 LAB — URINE CULTURE: Culture: >=100000 — AB

## 2020-01-31 LAB — GLUCOSE, CAPILLARY
Glucose-Capillary: 137 mg/dL — ABNORMAL HIGH (ref 70–99)
Glucose-Capillary: 143 mg/dL — ABNORMAL HIGH (ref 70–99)
Glucose-Capillary: 153 mg/dL — ABNORMAL HIGH (ref 70–99)
Glucose-Capillary: 130 mg/dL — ABNORMAL HIGH (ref 70–99)
Glucose-Capillary: 132 mg/dL — ABNORMAL HIGH (ref 70–99)

## 2020-01-31 MED ORDER — ISOSORB DINITRATE-HYDRALAZINE 20-37.5 MG PO TABS
1.0000 | ORAL_TABLET | Freq: Three times a day (TID) | ORAL | Status: DC
  Administered 2020-01-31 – 2020-02-02 (×6): 1
  Filled 2020-01-31 (×7): qty 1

## 2020-01-31 NOTE — Progress Notes (Signed)
[place] PHYSICAL MEDICINE & REHABILITATION PROGRESS NOTE   Subjective/Complaints: No complaints. Expressive aphasia Sleeping initially but easily aroused.   ROS: limited due to cognition  Objective:   No results found. Recent Labs    01/29/20 1302 01/30/20 0519  WBC 9.8 8.8  HGB 13.6 13.2  HCT 41.2 40.8  PLT 265 242   Recent Labs    01/29/20 1302 01/30/20 0519  NA 135 136  K 4.0 4.0  CL 102 104  CO2 19* 20*  GLUCOSE 289* 159*  BUN 22* 22*  CREATININE 1.03* 1.07*  CALCIUM 9.6 9.4    Intake/Output Summary (Last 24 hours) at 01/31/2020 1048 Last data filed at 01/31/2020 1015 Gross per 24 hour  Intake 522 ml  Output 500 ml  Net 22 ml     Physical Exam: Vital Signs Blood pressure 104/68, pulse 94, temperature 98.5 F (36.9 C), resp. rate 18, height 5\' 6"  (1.676 m), weight 94.5 kg, last menstrual period 08/21/2011, SpO2 99 %. General: Alert and oriented x 3, No apparent distress HEENT: Head is normocephalic, atraumatic, PERRLA, EOMI, sclera anicteric, oral mucosa pink and moist, dentition intact, ext ear canals clear,  Neck: Supple without JVD or lymphadenopathy Heart: Reg rate and rhythm. No murmurs rubs or gallops Chest: CTA bilaterally without wheezes, rales, or rhonchi; no distress Abdomen: Soft, non-tender, non-distended, bowel sounds positive. Extremities: No clubbing, cyanosis, or edema. Pulses are 2+ Skin: No evidence of breakdown, no evidence of rash  intact, motor strength is grossly 4- 5/5 in left . 3-/5 RIght  deltoid, bicep, tricep, grip, 5/5 left  3-/5 right hip flexor, knee extensors, ankle dorsiflexor and plantar flexor,2- right hip ext     Musculoskeletal:No right ankle swelling, no TTP over ATFL, mild pain with passive supination RIght ankle    Assessment/Plan: 1. Functional deficits secondary to Cardioembolic Left MCA infarct  which require 3+ hours per day of interdisciplinary therapy in a comprehensive inpatient rehab  setting.  Physiatrist is providing close team supervision and 24 hour management of active medical problems listed below.  Physiatrist and rehab team continue to assess barriers to discharge/monitor patient progress toward functional and medical goals  Care Tool:  Bathing  Bathing activity did not occur: Refused Body parts bathed by patient: Right arm, Chest, Abdomen, Front perineal area, Right upper leg, Left upper leg, Face, Left lower leg, Right lower leg, Buttocks   Body parts bathed by helper: Left arm     Bathing assist Assist Level: Minimal Assistance - Patient > 75%     Upper Body Dressing/Undressing Upper body dressing   What is the patient wearing?: Pull over shirt    Upper body assist Assist Level: Minimal Assistance - Patient > 75%    Lower Body Dressing/Undressing Lower body dressing      What is the patient wearing?: Incontinence brief, Pants     Lower body assist Assist for lower body dressing: Moderate Assistance - Patient 50 - 74%     Toileting Toileting    Toileting assist Assist for toileting: 2 Helpers     Transfers Chair/bed transfer  Transfers assist  Chair/bed transfer activity did not occur: Safety/medical concerns  Chair/bed transfer assist level: Moderate Assistance - Patient 50 - 74% Chair/bed transfer assistive device: Programmer, multimedia   Ambulation assist   Ambulation activity did not occur: Safety/medical concerns  Assist level: Moderate Assistance - Patient 50 - 74% Assistive device: Hand held assist Max distance: 45'   Walk 10 feet activity  Assist  Walk 10 feet activity did not occur: Safety/medical concerns  Assist level: 2 helpers Assistive device: Walker-rolling   Walk 50 feet activity   Assist Walk 50 feet with 2 turns activity did not occur: Safety/medical concerns (not yet making turns)         Walk 150 feet activity   Assist Walk 150 feet activity did not occur: Safety/medical  concerns         Walk 10 feet on uneven surface  activity   Assist Walk 10 feet on uneven surfaces activity did not occur: Safety/medical concerns         Wheelchair     Assist Will patient use wheelchair at discharge?:  (TBD)             Wheelchair 50 feet with 2 turns activity    Assist            Wheelchair 150 feet activity     Assist      Assist Level: Supervision/Verbal cueing   Blood pressure 104/68, pulse 94, temperature 98.5 F (36.9 C), resp. rate 18, height 5\' 6"  (1.676 m), weight 94.5 kg, last menstrual period 08/21/2011, SpO2 99 %.  Medical Problem List and Plan: 1.  Right-sided hemiparesis and aphasia secondary to left MCA infarction/petechial hemorrhage with saccular aneurysm at the paraclinoid right ICA status post mechanical thrombectomy  Continue CIR PT, OT, SLP   WHO/PRAFO qhs  2.  Antithrombotics: -DVT/anticoagulation: Eliquis             -antiplatelet therapy: N/A 3. Pain Management: Neurontin 100 mg every 8 hours, Zanaflex 2 mg twice daily as needed  6/28- no complaints             Monitor with increased exertion 4. Mood: Provide emotional support- cannot assess accurately related to severe aphasia, empirically started on citalopram for "flat affect"  Which may be MCA infarct effect              -antipsychotic agents: N/A 5. Neuropsych: This patient is not capable of making decisions on her own behalf. 6. Skin/Wound Care: Routine skin checks 7. Fluids/Electrolytes/Nutrition: Routine in and outs.  Oral intake improving 75-100% will Drinking  fluids  987ml 6/23 8.  Post stroke dysphagia.    Dysphagia #1 pudding thick liquids.  Follow-up speech therapy.  6/26- has NGT in place- sedated so not drinking- will recheck labs in AM and see if dehydrated due to sedation- will monitor  Advance diet as tolerated 9.  Hypertension.  Coreg 50 mg twice daily,Bidil 20-37.5 mg 2 tabs 3 times daily            Vitals:   01/31/20 0718  01/31/20 0958  BP: 114/67 104/68  Pulse: 84 94  Resp:  18  Temp:  98.5 F (36.9 C)  SpO2:  99%   6/28: Low. Decrease Bidil to 1 tab TID 10.  Atrial fibrillation.  Lanoxin 0.125 mg daily.  Cardiac rate controlled- on Eliquis 11.  Chronic diastolic congestive heart failure.  Demadex 20 mg daily.  Monitor for any signs of fluid overload Filed Weights   01/28/20 0345 01/29/20 0523 01/30/20 0419  Weight: 96 kg 95.7 kg 94.5 kg   Stable weights, overall fluid intake reduced now that TF is off   6/27- weight down another kg- not sure if drinking, but Cr stable and BUN stable at 22- Cr 1.07 12.  Diabetes mellitus with peripheral neuropathy.  Hemoglobin A1c 11.6.  NovoLog 2 units every 4 hours,  Lantus insulin 60 units nightly, Glucophage 500 mg twice daily CBG (last 3)  Recent Labs    01/30/20 1613 01/30/20 2057 01/31/20 0614  GLUCAP 199* 226* 137*   .   6/20: am CBG ok on Lantus  72U.  Metformin 1000mg  BID started 6/24- monitor for now   6/26- BGs elevated- likely due to UTI- con't regimen and do SSI as needed right now  6/27- BGs MUCH better today- likely due to treating UTI- 138-209 (yesterday 200-300)  6/28: well controlled              Monitor with increased mobility 13.  COPD with tobacco abuse.  Continue nebulizer treatments 14.  Polysubstance abuse.  Urine drug screen positive cocaine.    Counsel when appropriate 15.  Hyperlipidemia.  Crestor 16.  GERD.  Protonix 17.  AKI Creat ok mainly prerenal azotemia with elevated BUN - no need to hold metformin at this time  18. RIght  ATFL sprain, order ankle splint, may use Ice , Remove at noc  19.  Urinary retention may have diabetic cystopathy  Improved  Off urecholine and flomax  20. UTI  6/26- will add Rocephin- PCN "allergy is nausea" per notes-will monitor for side effects- U Cx pending.  6/27- U Cx shows 100k Klebsiella and 60k E Coli- sensitvities are PENDING  ; cont' Rocphin until results back.    LOS: 26 days A FACE TO  FACE EVALUATION WAS PERFORMED  Robin [name] 01/31/2020, 10:48 AM

## 2020-01-31 NOTE — Discharge Instructions (Signed)
Inpatient Rehab Discharge Instructions  Robin [name] Discharge date and time: No discharge date for patient encounter.   Activities/Precautions/ Functional Status: Activity: activity as tolerated Diet:  Wound Care: none needed Functional status:  ___ No restrictions     ___ Walk up steps independently ___ 24/7 supervision/assistance   ___ Walk up steps with assistance ___ Intermittent supervision/assistance  ___ Bathe/dress independently ___ Walk with walker     __x_ Bathe/dress with assistance ___ Walk Independently    ___ Shower independently ___ Walk with assistance    ___ Shower with assistance ___ No alcohol     ___ Return to work/school ________ COMMUNITY REFERRALS UPON DISCHARGE:    Home Health:   PT     OT     ST   Aide                Agency: [facility] SAYTK:[phone]   Medical Equipment/Items Ordered: Wheelchair, Tub [employer] fast streamer and 3N1                                                 Agency/Supplier: [employer]   Special Instructions: No driving, smoking, alcohol or illicit drug use STROKE/TIA DISCHARGE INSTRUCTIONS SMOKING Cigarette smoking nearly doubles your risk of having a stroke & is the single most alterable risk factor  If you smoke or have smoked in the last 12 months, you are advised to quit smoking for your health.  Most of the excess cardiovascular risk related to smoking disappears within a year of stopping.  Ask you doctor about anti-smoking medications  [place] Quit Line: 1-800-QUIT NOW  Free Smoking Cessation Classes [phone]  CHOLESTEROL Know your levels; limit fat & cholesterol in your diet  Lipid Panel     Component Value Date/Time   CHOL 121 12/27/2019 0305   TRIG 128 12/27/2019 0305   HDL 27 (L) 12/27/2019 0305   CHOLHDL 4.5 12/27/2019 0305   VLDL 26 12/27/2019 0305   LDLCALC 68 12/27/2019 0305      Many patients benefit from treatment even if their cholesterol is at goal.  Goal: Total Cholesterol (CHOL) less  than 160  Goal:  Triglycerides (TRIG) less than 150  Goal:  HDL greater than 40  Goal:  LDL (LDLCALC) less than 100   BLOOD PRESSURE American Stroke Association blood pressure target is less that 120/80 mm/Hg  Your discharge blood pressure is:  BP: 121/78  Monitor your blood pressure  Limit your salt and alcohol intake  Many individuals will require more than one medication for high blood pressure  DIABETES (A1c is a blood sugar average for last 3 months) Goal HGBA1c is under 7% (HBGA1c is blood sugar average for last 3 months)  Diabetes:   Lab Results  Component Value Date   HGBA1C 11.6 (H) 12/27/2019     Your HGBA1c can be lowered with medications, healthy diet, and exercise.  Check your blood sugar as directed by your physician  Call your physician if you experience unexplained or low blood sugars.  PHYSICAL ACTIVITY/REHABILITATION Goal is 30 minutes at least 4 days per week  Activity: Increase activity slowly, Therapies: Physical Therapy: Home Health Return to work:   Activity decreases your risk of heart attack and stroke and makes your heart stronger.  It helps control your weight and blood pressure; helps you relax and  can improve your mood.  Participate in a regular exercise program.  Talk with your doctor about the best form of exercise for you (dancing, walking, swimming, cycling).  DIET/WEIGHT Goal is to maintain a healthy weight  Your discharge diet is:  Diet Order            DIET - DYS 1 Room service appropriate? Yes; Fluid consistency: Pudding Thick  Diet effective now              liquids Your height is:    Your current weight is: Weight: 95.8 kg Your Body Mass Index (BMI) is:  BMI (Calculated): 34.1  Following the type of diet specifically designed for you will help prevent another stroke.  Your goal weight range is:    Your goal Body Mass Index (BMI) is 19-24.  Healthy food habits can help reduce 3 risk factors for stroke:  High cholesterol,  hypertension, and excess weight.  RESOURCES Stroke/Support Group:  Call [phone]   STROKE EDUCATION PROVIDED/REVIEWED AND GIVEN TO PATIENT Stroke warning signs and symptoms How to activate emergency medical system (call 911). Medications prescribed at discharge. Need for follow-up after discharge. Personal risk factors for stroke. Pneumonia vaccine given:  Flu vaccine given:  My questions have been answered, the writing is legible, and I understand these instructions.  I will adhere to these goals & educational materials that have been provided to me after my discharge from the hospital.      My questions have been answered and I understand these instructions. I will adhere to these goals and the provided educational materials after my discharge from the hospital.  Patient/Caregiver Signature _______________________________ Date __________  Clinician Signature _______________________________________ Date __________  Please bring this form and your medication list with you to all your follow-up doctor's appointments.   Information on my medicine - ELIQUIS (apixaban)  This medication education was reviewed with me or my healthcare representative as part of my discharge preparation.  The pharmacist that spoke with me during my hospital stay was:  Onnie Boer, RPH-CPP  Why was Eliquis prescribed for you? Eliquis was prescribed for you to reduce the risk of a blood clot forming that can cause a stroke if you have a medical condition called atrial fibrillation (a type of irregular heartbeat).  What do You need to know about Eliquis ? Take your Eliquis TWICE DAILY - one tablet in the morning and one tablet in the evening with or without food. If you have difficulty swallowing the tablet whole please discuss with your pharmacist how to take the medication safely.  Take Eliquis exactly as prescribed by your doctor and DO NOT stop taking Eliquis without talking to the doctor who  prescribed the medication.  Stopping may increase your risk of developing a stroke.  Refill your prescription before you run out.  After discharge, you should have regular check-up appointments with your healthcare provider that is prescribing your Eliquis.  In the future your dose may need to be changed if your kidney function or weight changes by a significant amount or as you get older.  What do you do if you miss a dose? If you miss a dose, take it as soon as you remember on the same day and resume taking twice daily.  Do not take more than one dose of ELIQUIS at the same time to make up a missed dose.  Important Safety Information A possible side effect of Eliquis is bleeding. You should call your healthcare provider right  away if you experience any of the following: ? Bleeding from an injury or your nose that does not stop. ? Unusual colored urine (red or dark brown) or unusual colored stools (red or black). ? Unusual bruising for unknown reasons. ? A serious fall or if you hit your head (even if there is no bleeding).  Some medicines may interact with Eliquis and might increase your risk of bleeding or clotting while on Eliquis. To help avoid this, consult your healthcare provider or pharmacist prior to using any new prescription or non-prescription medications, including herbals, vitamins, non-steroidal anti-inflammatory drugs (NSAIDs) and supplements.  This website has more information on Eliquis (apixaban): http://[email or web address].com/eliquis/home

## 2020-01-31 NOTE — Progress Notes (Signed)
Occupational Therapy Session Note  Patient Details  Name: Robin [name] MRN: [id number] Date of Birth: 1965-12-03  Today's Date: 01/31/2020 OT Individual Time: 1445-1600 OT Individual Time Calculation (min): 75 min    Short Term Goals: Week 4:  OT Short Term Goal 1 (Week 4): Continue working on established LTGs set a min assist level overall.  Skilled Therapeutic Interventions/Progress Updates:    Upon entering the room, pt supine in bed with no c/o pain and agreeable to OT intervention. BP taken with results of 105/59. [name] sister and daughter present for hands on family education. OT educated caregivers on need for B thigh high TEDs for hypotension. Sister returned demonstrations and provided assist with donning TED hose. Supine >sit with min A to EOB. OT educated caregiver on hemiplegic dressing with them providing cuing and assist for balance for pt to don pants EOB. Pt standing with RW and min A for balance while pulling pants over B hips. Caregivers assisted pt with min A transfer into wheelchair. OT reviewed wheelchair parts and they demonstrated locking breaks, managing leg rests, and positioning wheelchair the rest of the session. OT assisted pt to tub room via wheelchair and discussed home set up. OT demonstrated transfer from wheelchair > TTB but caregivers so no believe there is enough room in bathroom for this to work safely. OT recommended this be a goal for HHOT to address. They did think they could get wheelchair up to sink for bathing with sit <>stand. Discussed use of 3 in 1 for next to bed and over toilet at home. Caregivers verbalized understanding. Pt demonstrated transfer from wheelchair<> standard bed in ADL apartment with min A and use of RW. Caregivers providing appropriate cuing for set up, sequencing, and hand placement with transfers. Pt returning to room and back to bed. OT recommended purchase of baby monitor in order to see/hear if she needed assistance in the middle of  the night or if she was trying to get out of bed. OT also briefly reviewed AAROM HEP for R UE. PT remained in bed with call bell and all needed items within reach upon exiting the room. Bed alarm activated.    Therapy Documentation Precautions:  Precautions Precautions: Fall Precaution Comments: R hemiparesis, R inattention Restrictions Weight Bearing Restrictions: No General:   Vital Signs: Therapy Vitals Temp: (!) 97 F (36.1 C) Pulse Rate: 89 Resp: 16 BP: 116/78 Patient Position (if appropriate): Lying Oxygen Therapy SpO2: 100 % O2 Device: Room Air ADL: ADL Grooming: Maximal assistance Where Assessed-Grooming: Wheelchair Upper Body Bathing: Maximal assistance Where Assessed-Upper Body Bathing: Wheelchair Lower Body Bathing: Other (comment) (total +2) Where Assessed-Lower Body Bathing: Other (Comment), Wheelchair Upper Body Dressing: Dependent Where Assessed-Upper Body Dressing: Wheelchair, Sitting at sink Lower Body Dressing: Other (Comment) (total +2) Where Assessed-Lower Body Dressing: Wheelchair, Sitting at sink, Standing at sink Toileting: Other (Comment) (total +2) Where Assessed-Toileting: Bedside Commode Toilet Transfer: Other (comment) (total +2) Toilet Transfer Method: Other (comment) (Stedy +2 assist)   Therapy/Group: Individual Therapy  Gypsy Decant 01/31/2020, 4:22 PM

## 2020-01-31 NOTE — Discharge Summary (Signed)
Physician Discharge Summary  Patient ID: Robin [name] MRN: [id number] DOB/AGE: 04-25-1966 54 y.o.  Admit date: 01/05/2020 Discharge date: 02/02/2020  Discharge Diagnoses:  Principal Problem:   Left middle cerebral artery stroke [facility]) Active Problems:   AKI (acute kidney injury) ([place])   Diabetic peripheral neuropathy ([place])   Essential hypertension   Chronic diastolic congestive heart failure (HCC)   Right hemiparesis (HCC)   Expressive disorder due to brain damage DVT prophylaxis Post stroke dysphagia Atrial fibrillation COPD Polysubstance abuse Hyperlipidemia GERD Klebsiella E. coli UTI Nosebleed  Discharged Condition: Stable  Significant Diagnostic Studies: DG Swallowing Func-Speech Pathology  Result Date: 01/14/2020 Objective Swallowing Evaluation: Type of Study: MBS-Modified Barium Swallow Study  Patient Details Name: Robin [name] MRN: [id number] Date of Birth: 1965-09-04 Today's Date: 01/14/2020 Past Medical History: Past Medical History: Diagnosis Date . Allergy  . Anemia  . Arthritis  . CHF (congestive heart failure) ([place])   a. EF at 30-35% by echo in 08/2018 b. EF at 35% by repeat echo in 05/2019 . Chronic abdominal pain  . Cocaine abuse ([place])  . COPD (chronic obstructive pulmonary disease) ([place])  . Essential hypertension, benign  . Fatty liver  . GERD (gastroesophageal reflux disease)  . Gout 2016 . Normal coronary arteries   3/10 - following abnormal Myoview . Ovarian cyst  . Type 2 diabetes mellitus ([place])  Past Surgical History: Past Surgical History: Procedure Laterality Date . ABDOMINAL HYSTERECTOMY  09/10/2011  Procedure: HYSTERECTOMY ABDOMINAL;  Surgeon: Jonnie Kind, MD;  Location: [facility];  Service: Gynecology;  Laterality: N/A;  Abdominal hysterectomy . CESAREAN SECTION  [id number], and 1994 . CHOLECYSTECTOMY  1995 . IR CT HEAD LTD  12/27/2019 . IR PERCUTANEOUS ART THROMBECTOMY/INFUSION INTRACRANIAL INC DIAG ANGIO  12/27/2019 . IR US GUIDE VASC ACCESS RIGHT  12/27/2019 .  RADIOLOGY WITH ANESTHESIA N/A 12/27/2019  Procedure: IR WITH ANESTHESIA;  Surgeon: Luanne Bras, MD;  Location: [place];  Service: Radiology;  Laterality: N/A; . SCAR REVISION  09/10/2011  Procedure: SCAR REVISION;  Surgeon: Jonnie Kind, MD;  Location: [facility];  Service: Gynecology;  Laterality: N/A;  Wide Excision of old Cicatrix . TUBAL LIGATION  1994 HPI: Pt is a 54 year old female admitted with acute L MCA CVA and L ICA occlusion s/p thrombectomy. ETT 5/24-5/27. PMH includes: DM, Afib, CHF, polysubstance abuse, COPD, GERD, HTN. Pt [facility] to [facility]05[facility]20.  Assessment / Plan / Recommendation CHL IP CLINICAL IMPRESSIONS 01/14/2020 Clinical Impression --Pt presents with significantly improved oropharyngeal swallow function in comparison to last MBSS performed on 12/31/19. Although pt's oral phase is still remarkable for prolonged mastication and somewhat delayed AP transit of solids, mastication is becoming more efficient and she is achieving good oral clearance with purees and Dysphagia 2 (minced/ground) solids. During today's study, thin liquids intermittently left min lingual residue, which spilled prematurely into the pharynx after initial swallow, however this was sensed by the pt, who automatically triggered an additional swallow with airway protection remaining in tact. [name] frequently initiated swallow at the level of the pyriform sinuses, but in over 15 sips of thin barium via cup and straw, only trace penetration noted above the level of the vocal folds X3, with material being ejected from the vestibule in 2 out of 3 instances (PAS scale 2 and 3). No aspiration events observed. Pt also demonstrated ability to consume barium pill with good oral control, bolus cohesion, and airway protection. Recommend pt upgrade to thin liquids, continue Dysphagia 2 (minced/ground) solids, medications may be given  whole with thin. Please provide full supervision to ensure use of small sips, slow rate, and sustained  attention to intake.  SLP Visit Diagnosis Dysphagia, oropharyngeal phase (R13.12) Attention and concentration deficit following -- Frontal lobe and executive function deficit following -- Impact on safety and function Mild aspiration risk   CHL IP TREATMENT RECOMMENDATION 12/31/2019 Treatment Recommendations Therapy as outlined in treatment plan below   Prognosis 12/31/2019 Prognosis for Safe Diet Advancement Good Barriers to Reach Goals Language deficits Barriers/Prognosis Comment -- CHL IP DIET RECOMMENDATION 01/14/2020 SLP Diet Recommendations Dysphagia 2 (Fine chop) solids;Thin liquid Liquid Administration via Straw;Cup Medication Administration Whole meds with liquid Compensations Minimize environmental distractions;Slow rate;Small sips/bites;Monitor for anterior loss Postural Changes Seated upright at 90 degrees   CHL IP OTHER RECOMMENDATIONS 01/14/2020 Recommended Consults -- Oral Care Recommendations Oral care BID Other Recommendations --   CHL IP FOLLOW UP RECOMMENDATIONS 01/05/2020 Follow up Recommendations Inpatient Rehab   CHL IP FREQUENCY AND DURATION 12/31/2019 Speech Therapy Frequency (ACUTE ONLY) min 2x/week Treatment Duration 2 weeks      CHL IP ORAL PHASE 01/14/2020 Oral Phase Impaired Oral - Pudding Teaspoon -- Oral - Pudding Cup -- Oral - Honey Teaspoon NT Oral - Honey Cup NT Oral - Nectar Teaspoon NT Oral - Nectar Cup -- Oral - Nectar Straw -- Oral - Thin Teaspoon NT Oral - Thin Cup Piecemeal swallowing;Premature spillage Oral - Thin Straw Premature spillage;Piecemeal swallowing Oral - Puree Delayed oral transit;Lingual pumping Oral - Mech Soft Delayed oral transit;Impaired mastication Oral - Regular -- Oral - Multi-Consistency -- Oral - Pill WFL Oral Phase - Comment --  CHL IP PHARYNGEAL PHASE 01/14/2020 Pharyngeal Phase -- Pharyngeal- Pudding Teaspoon -- Pharyngeal -- Pharyngeal- Pudding Cup -- Pharyngeal -- Pharyngeal- Honey Teaspoon -- Pharyngeal -- Pharyngeal- Honey Cup -- Pharyngeal -- Pharyngeal-  Nectar Teaspoon -- Pharyngeal -- Pharyngeal- Nectar Cup -- Pharyngeal -- Pharyngeal- Nectar Straw -- Pharyngeal -- Pharyngeal- Thin Teaspoon -- Pharyngeal -- Pharyngeal- Thin Cup Delayed swallow initiation-pyriform sinuses Pharyngeal -- Pharyngeal- Thin Straw Delayed swallow initiation-pyriform sinuses;Delayed swallow initiation-vallecula Pharyngeal -- Pharyngeal- Puree WFL Pharyngeal -- Pharyngeal- Mechanical Soft -- Pharyngeal -- Pharyngeal- Regular -- Pharyngeal -- Pharyngeal- Multi-consistency -- Pharyngeal -- Pharyngeal- Pill -- Pharyngeal -- Pharyngeal Comment --  CHL IP CERVICAL ESOPHAGEAL PHASE 01/14/2020 Cervical Esophageal Phase WFL Pudding Teaspoon -- Pudding Cup -- Honey Teaspoon -- Honey Cup -- Nectar Teaspoon -- Nectar Cup -- Nectar Straw -- Thin Teaspoon -- Thin Cup -- Thin Straw -- Puree -- Mechanical Soft -- Regular -- Multi-consistency -- Pill -- Cervical Esophageal Comment -- Arbutus Leas 01/14/2020, 9:46 AM               Labs:  Basic Metabolic Panel: Recent Labs  Lab 01/26/20 0549 01/29/20 1302 01/30/20 0519  NA 137 135 136  K 4.2 4.0 4.0  CL 102 102 104  CO2 24 19* 20*  GLUCOSE 185* 289* 159*  BUN 18 22* 22*  CREATININE 1.08* 1.03* 1.07*  CALCIUM 9.5 9.6 9.4    CBC: Recent Labs  Lab 01/28/20 1215 01/29/20 1302 01/30/20 0519  WBC 9.8 9.8 8.8  NEUTROABS  --   --  4.9  HGB 13.3 13.6 13.2  HCT 39.6 41.2 40.8  MCV 86.5 86.7 87.0  PLT 324 265 242    CBG: Recent Labs  Lab 01/31/20 2133 02/01/20 0632 02/01/20 1201 02/01/20 1653 02/01/20 2109  GLUCAP 132* 97 155* 125* 190*   Family history.  Mother with cirrhosis and alcohol use.  Father  with diabetes mellitus Sister with diabetes mellitus.  Denies any colon cancer esophageal cancer or rectal cancer  Brief HPI:   Robin [name] is a 54 y.o. right-handed female with history of diastolic congestive heart failure hypertension, atrial fibrillation on Eliquis, COPD, polysubstance abuse, diabetes mellitus and tobacco  abuse.  Patient presented 12/27/2019 with right-sided weakness altered mental status and aphasia to [facility].  Cranial CT scan showed question evolving hypodensity involving the left basal ganglia left insula and adjacent left temporal lobe.  CT perfusion scan as well as CTA of head and neck showed a 24 cc acute infarct deep and subcortical white matter of the anterior left frontal region.  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  Patient did not receive TPA.  Patient was transferred to [facility].  MRI showed evolving left MCA infarction with hemorrhagic transformation.  Mass-effect with a 6 mm rightward midline shift.  Admission chemistries positive cocaine, glucose 355, creatinine 1.15, troponin negative, BNP 367, alcohol negative.  Echocardiogram with ejection fraction of 35% global hypokinesis.  Patient underwent mechanical thrombectomy per interventional radiology required intubation and extubated 12/28/2019.  Initially maintained on low-dose aspirin transition back to Eliquis.  Hospital course dysphagia #1 pudding thick liquids nasogastric tube for nutritional support.  Patient was admitted for a comprehensive rehab program.   Hospital Course: Robin [name] was admitted to rehab 01/05/2020 for inpatient therapies to consist of PT, ST and OT at least three hours five days a week. Past admission physiatrist, therapy team and rehab RN have worked together to provide customized collaborative inpatient rehab.  Pertaining to patient's left MCA infarction petechial hemorrhage saccular aneurysm mechanical thrombectomy patient remained on Eliquis therapy following up with neurology as well as interventional radiology.  Pain managed with use of Neurontin 100 mg every 8 hours Zanaflex as needed.  Patient's diet has been advanced remaining on diabetic restrictions.  Blood pressure controlled on Coreg with cardiac rate monitored on Lanoxin as well as continuing with Bidil and  low-dose Demadex exhibiting no signs of fluid overload.  Mood stabilization with Celexa and emotional support provided.  Hemoglobin A1c 11.6 with insulin therapy as directed as well as Glucophage and full diabetic teaching.  Patient was completed course of Rocephin for a Klebsiella E. coli UTI remaining afebrile no dysuria noted but patient did initially have some urinary retention improved with the use of Urecholine and Flomax which is since been discontinued..  Noted history of polysubstance abuse urine drug screen positive patient family received counts regards to cessation of illicit drug products as well as alcohol.  Patient did have episode of a nosebleed x1 Rhino Rocket inserted follow-up hemoglobin hematocrit stable and nosebleed dissipated.   Blood pressures were monitored on TID basis and controlled  Diabetes has been monitored with ac/hs CBG checks and SSI was use prn for tighter BS control.    Rehab course: During patient's stay in rehab weekly team conferences were held to monitor patient's progress, set goals and discuss barriers to discharge. At admission, patient required moderate assist sit to stand, +2 physical assist squat pivot transfers max assist ambulate 1 foot.  Max assist toilet transfers max assist functional mobility during ADLs  Physical exam.  Blood pressure 106/75 pulse 99 temperature 96 respirations 18 oxygen saturations 92% room air Constitutional.  Well-developed no acute distress HEENT Head.  Normocephalic and atraumatic Eyes.  Pupils round and reactive to light no discharge.nystagmus Neck.  Supple nontender no  JVD without thyromegaly Cardiac regular rate rhythm without any extra sounds or murmur heard Abdomen.  Soft nontender positive bowel sounds without rebound Respiratory effort normal no respiratory distress without wheeze Neurological alert no acute distress globally aphasic limited to follow commands no movement noted right side   /She  has had  improvement in activity tolerance, balance, postural control as well as ability to compensate for deficits. Robin [name] has had improvement in functional use RUE/LUE  and RLE/LLE as well as improvement in awareness.  Patient progressing with therapy demonstrating improvement right lower extremity functional strength standing balance and right attention resulting in improvements in functional mobility levels.  She is performing supine to sit with minimal assist sit to stand minimal assist squat pivot or progression to stand pivot transfers using rolling walker min mod assist.  She continued to demonstrate impaired right lower extremity strength motor control and motor planning.  Continues to demonstrate deficits in muscle weakness muscle joint tightness.  She was able to ambulate from the edge of bed to the shower with moderate assist using rolling walker with hand splint on the right for support.  She transferred out to the wheelchair at the sink with moderate assist using a walker for dressing.  She needed moderate assist for mod demonstration of cueing for completion of upper body dressing as well as moderate assist to sit to stand for lower body dressing.Marland Kitchen  She was able to gesture for her simple needs communicate simple sentences.  Full family teaching completed plan discharged home       Disposition: Discharged home    Diet: Diabetic diet  Special Instructions: No driving smoking alcohol or illicit drug use  Medications at discharge 1.  Tylenol as needed 2.  Eliquis 5 mg twice daily 3.  Coreg 25 mg twice daily 4.  Celexa 10 mg p.o. daily 5.  Lanoxin 0.125 mg daily 6.  Neurontin 100 mg p.o. every 8 hours 7.  Lantus insulin 72 units at bedtime 8.Bidil 20-37.5 mg 2 tabs 3 times daily 9.  Glucophage 1000 mg p.o. twice daily 10.  Multivitamin daily 11.  Nitroglycerin as needed 12.  Crestor 20 mg p.o. daily 13.  Zanaflex 2 mg p.o. twice daily as needed 14.  Demadex 10 mg p.o. daily 15.Keflex 500  mg TID x three days    30-35 minutes were spent completing discharge summary and discharge planning  Discharge Instructions    Ambulatory referral to Neurology   Complete by: As directed    An appointment is requested in approximately 4 weeks left MCA infarction   Ambulatory referral to Physical Medicine Rehab   Complete by: As directed    Moderate complexity follow-up 1 to 2 weeks left MCA infarction       Follow-up Information    Kirsteins, Luanna Salk, MD Follow up.   Specialty: Physical Medicine and Rehabilitation Why: Office to call for appointment Contact information: [place] [address] [phone]        Corrie Mckusick, DO Follow up.   Specialties: Interventional Radiology, Radiology Why: Call for appointment Contact information: [address] [place] [address] [phone]               Signed: Lavon Paganini Duck 02/02/2020, 5:01 AM

## 2020-01-31 NOTE — Progress Notes (Signed)
Speech Language Pathology Daily Session Note  Patient Details  Name: Robin [name] MRN: [id number] Date of Birth: 1966/05/24  Today's Date: 01/31/2020 SLP Individual Time: 0731-0827 SLP Individual Time Calculation (min): 56 min  Short Term Goals: Week 4: SLP Short Term Goal 1 (Week 4): STG=LTG due to remaining length of stay  Skilled Therapeutic Interventions: Pt was seen for skilled ST targeting education with pt's sister, [name], as well as working toward dysphagia and language goals. [name] sister present for first 1/3 of session and education started regarding expressive language deficits and strategies for compensation. [name] had to leave to go to work, however handout left for her review when she returns for more education this afternoon, and she was advised to return tomorrow for more ST education if she has further questions. On handout, left note reinforcing recommendation for 24/7 supervision for safety based on cognitive communication deficits.  SLP provided skilled observation of pt consuming current regular texture breakfast with thin liquids. Pt used safe swallow strategies independently, mastication was efficient and full oral clearance achieved. No overt s/sx aspiration observed. However, she did require Supervision A verb  al cues for sustained attention to intake during meal. Recommend continue current diet.  During structured word level speech tasks, pt verbalized CV or CVC words to finish a sentence when provided Max A semantic, phonemic, and visual cues (pictures and written word) with 5/7 accuracy. When attempting to verbalize "dog" she initiated showing SLP picture of her dog in cell phone, but unable to verbalize his name. Pt expressed wants and needs during session with no more than Min A verbal cues for accuracy/clariciation of gestures and verbal cues to narrow conversation by provided choices and question cues. Pt left laying in bed with alarm set and needs within reach. Continue  per current plan of care.        Pain Pain Assessment Pain Scale: 0-10 Pain Score: 0-No pain  Therapy/Group: Individual Therapy  Arbutus Leas 01/31/2020, 7:23 AM

## 2020-01-31 NOTE — Progress Notes (Signed)
Physical Therapy Missed Visit Note 60 min missed time  Patient Details  Name: Robin [name] MRN: [id number] Date of Birth: [birth year] Today's Date: 01/31/2020   Pt receiving nursing care at time of originally scheduled session.  Therapist returned 45 min later and pt refused.  Attempted to discuss w/pt but pt adamant repeating "not today". [name], PT    [name] 01/31/2020, 4:19 PM

## 2020-02-01 ENCOUNTER — Inpatient Hospital Stay (HOSPITAL_COMMUNITY): Payer: 59 | Admitting: Occupational Therapy

## 2020-02-01 ENCOUNTER — Inpatient Hospital Stay (HOSPITAL_COMMUNITY): Payer: 59 | Admitting: Speech Pathology

## 2020-02-01 ENCOUNTER — Inpatient Hospital Stay (HOSPITAL_COMMUNITY): Payer: 59 | Admitting: Physical Therapy

## 2020-02-01 ENCOUNTER — Ambulatory Visit (HOSPITAL_COMMUNITY): Payer: 59 | Admitting: Physical Therapy

## 2020-02-01 LAB — GLUCOSE, CAPILLARY
Glucose-Capillary: 97 mg/dL (ref 70–99)
Glucose-Capillary: 155 mg/dL — ABNORMAL HIGH (ref 70–99)
Glucose-Capillary: 125 mg/dL — ABNORMAL HIGH (ref 70–99)
Glucose-Capillary: 190 mg/dL — ABNORMAL HIGH (ref 70–99)

## 2020-02-01 MED ORDER — INSULIN GLARGINE 100 UNIT/ML SOLOSTAR PEN: 72.0000 [IU] | PEN_INJECTOR | Freq: Every day | SUBCUTANEOUS | 11 refills | Status: DC

## 2020-02-01 MED ORDER — CARVEDILOL 25 MG PO TABS: 25.0000 mg | ORAL_TABLET | Freq: Two times a day (BID) | ORAL | 0 refills | Status: DC

## 2020-02-01 MED ORDER — GABAPENTIN 250 MG/5ML PO SOLN: 100.0000 mg | Freq: Three times a day (TID) | ORAL | 12 refills | Status: DC

## 2020-02-01 MED ORDER — APIXABAN 5 MG PO TABS: 5.0000 mg | ORAL_TABLET | Freq: Two times a day (BID) | ORAL | 1 refills | Status: DC

## 2020-02-01 MED ORDER — HYDRALAZINE HCL 25 MG PO TABS
25.0000 mg | ORAL_TABLET | Freq: Three times a day (TID) | ORAL | 11 refills | Status: DC
Start: 2020-02-01 — End: 2020-02-29

## 2020-02-01 MED ORDER — ADULT MULTIVITAMIN W/MINERALS CH: 1.0000 | ORAL_TABLET | Freq: Every day | ORAL | Status: DC

## 2020-02-01 MED ORDER — NITROGLYCERIN 0.4 MG SL SUBL: 0.4000 mg | SUBLINGUAL_TABLET | SUBLINGUAL | 12 refills | Status: DC | PRN

## 2020-02-01 MED ORDER — ISOSORB DINITRATE-HYDRALAZINE 20-37.5 MG PO TABS: 1.0000 | ORAL_TABLET | Freq: Three times a day (TID) | ORAL | 0 refills | Status: DC

## 2020-02-01 MED ORDER — DIGOXIN 125 MCG PO TABS: 0.1250 mg | ORAL_TABLET | Freq: Every day | ORAL | 0 refills | Status: DC

## 2020-02-01 MED ORDER — CITALOPRAM HYDROBROMIDE 10 MG PO TABS: 10.0000 mg | ORAL_TABLET | Freq: Every day | ORAL | 0 refills | Status: DC

## 2020-02-01 MED ORDER — ROSUVASTATIN CALCIUM 20 MG PO TABS: 20.0000 mg | ORAL_TABLET | Freq: Every day | ORAL | 0 refills | Status: DC

## 2020-02-01 MED ORDER — METFORMIN HCL 1000 MG PO TABS: 1000.0000 mg | ORAL_TABLET | Freq: Two times a day (BID) | ORAL | 1 refills | Status: DC

## 2020-02-01 MED ORDER — CEPHALEXIN 500 MG PO CAPS: 500.0000 mg | ORAL_CAPSULE | Freq: Three times a day (TID) | ORAL | 0 refills | Status: AC

## 2020-02-01 MED ORDER — ISOSORBIDE MONONITRATE ER 30 MG PO TB24: 60.0000 mg | ORAL_TABLET | Freq: Every day | ORAL | 11 refills | Status: DC

## 2020-02-01 MED ORDER — TIZANIDINE HCL 2 MG PO TABS: 2.0000 mg | ORAL_TABLET | Freq: Two times a day (BID) | ORAL | 0 refills | Status: DC | PRN

## 2020-02-01 MED ORDER — TORSEMIDE 10 MG PO TABS: 10.0000 mg | ORAL_TABLET | Freq: Every day | ORAL | 0 refills | Status: DC

## 2020-02-01 MED FILL — tiZANidine HCL 2 MG TABS: 2 | 15 days supply | Qty: 30 | Fill #0

## 2020-02-01 MED FILL — DIGOXIN 0.125 MG TABLET: 125 | 30 days supply | Qty: 30 | Fill #0

## 2020-02-01 MED FILL — NITROGLYCERIN 0.4 MG TAB SL: 0.4 | 7 days supply | Qty: 25 | Fill #0

## 2020-02-01 MED FILL — CITALOPRAM HBR 10 MG TABLET: 10 | 30 days supply | Qty: 30 | Fill #0

## 2020-02-01 MED FILL — CARVEDILOL 25 MG TABLET: 25 | 30 days supply | Qty: 60 | Fill #0

## 2020-02-01 MED FILL — LANTUS SOLOSTAR 100 UNITS/M: 100 | 20 days supply | Qty: 15 | Fill #0

## 2020-02-01 MED FILL — ELIQUIS 5 MG TABLET: 5 | 30 days supply | Qty: 60 | Fill #0

## 2020-02-01 MED FILL — CEPHALEXIN 500 MG CAPS: 500 | 3 days supply | Qty: 9 | Fill #0

## 2020-02-01 MED FILL — GABAPENTIN 250 MG/5ML SOLN: 250 | 30 days supply | Qty: 180 | Fill #0

## 2020-02-01 MED FILL — ROSUVASTATIN CALCIUM 20 MG: 20 | 30 days supply | Qty: 30 | Fill #0

## 2020-02-01 MED FILL — BD PEN NDL NANO 32GX5/32: 32G X 4 MM | 90 days supply | Qty: 100 | Fill #0

## 2020-02-01 MED FILL — hydrALAZINE HCL 25 MG TABS: 25 | 30 days supply | Qty: 90 | Fill #0

## 2020-02-01 MED FILL — METFORMIN HCL 1000 MG TABS: 1000 | 30 days supply | Qty: 60 | Fill #0

## 2020-02-01 MED FILL — TORSEMIDE 10 MG TABLET: 10 | 30 days supply | Qty: 30 | Fill #0

## 2020-02-01 MED FILL — ISOSORBIDE MN ER 30 MG TAB: 30 | 30 days supply | Qty: 60 | Fill #0

## 2020-02-01 NOTE — Plan of Care (Signed)
  Problem: RH BLADDER ELIMINATION Goal: RH STG MANAGE BLADDER WITH ASSISTANCE Description: STG Manage Bladder With mod Assistance Outcome: Progressing   Problem: RH SKIN INTEGRITY Goal: RH STG SKIN FREE OF INFECTION/BREAKDOWN Description: Patients skin will remain free from further infection or breakdown with mod assist. Outcome: Progressing Goal: RH STG MAINTAIN SKIN INTEGRITY WITH ASSISTANCE Description: STG Maintain Skin Integrity With mod Assistance. Outcome: Progressing Goal: RH STG ABLE TO PERFORM INCISION/WOUND CARE W/ASSISTANCE Description: STG Able To Perform Incision/Wound Care With mod Assistance. Outcome: Progressing   Problem: RH SAFETY Goal: RH STG ADHERE TO SAFETY PRECAUTIONS W/ASSISTANCE/DEVICE Description: STG Adhere to Safety Precautions With min Assistance/Device. Outcome: Progressing   Problem: RH KNOWLEDGE DEFICIT Goal: RH STG INCREASE KNOWLEDGE OF DIABETES Description: Patient/caregiver will verbalize understanding of the management of DM including diet, exercise, medications, monitoring with min assist. Outcome: Progressing

## 2020-02-01 NOTE — Progress Notes (Signed)
Orthopedic Tech Progress Note Patient Details:  Robin [name] February 04, 1966 [id number]  Ortho Devices Type of Ortho Device: Abdominal binder Ortho Device/Splint Location: STOMACH Ortho Device/Splint Interventions: Application   Post Interventions Patient Tolerated: Well Instructions Provided: Care of device   Janit Pagan 02/01/2020, 10:35 AM

## 2020-02-01 NOTE — Progress Notes (Addendum)
Nutrition Follow-up  DOCUMENTATION CODES:   Obesity unspecified  INTERVENTION:   - Continue MVI with minerals daily  - Continue Pro-stat 30 ml po BID, each supplement provides 100 kcal and 15 grams of protein  - Magic Cup BID with meals, each supplement provides 290 kcal and 9 grams of protein  NUTRITION DIAGNOSIS:   Inadequate oral intake related to dysphagia as evidenced by meal completion < 25%.  Progressing  GOAL:   Patient will meet greater than or equal to 90% of their needs  Progressing  MONITOR:   PO intake, Supplement acceptance, Labs, Weight trends  REASON FOR ASSESSMENT:   Consult Enteral/tube feeding initiation and management  ASSESSMENT:   54 year old female with PMH of CHF, HTN, atrial fibrillation, COPD, polysubstance abuse, DM, and tobacco abuse. Presented on 12/27/19 with right hemiparesis, AMS, and aphasia. MRI showed evolving left MCA infarction with hemorrhagic transformation. Pt underwent mechanical thrombectomy per interventional radiology and required intubation and was extubated 12/28/19. Started on dysphagia 1 diet with pudding-thick liquids with Cortrak tube in place for nutritional support. Admitted to CIR on 01/05/20.  6/11 - MBS, diet advanced to dysphagia 2 with thin liquids 6/15 - TF d/c 6/16 - Cortrak removed 6/23 - diet advanced to regular textures with thin liquids  Weight fairly stable.  Spoke with pt at bedside. Pt remains aphasic but was able to answer some questions. Noted pt with snacks in the room (fruit cups, applesauce, jello). Spoke with RN who reports pt ate well this morning at breakfast (at least 50% of meal).  Noted Glucerna Shakes that RD ordered were d/c. Pt accepting most Pro-stat per [facility] documentation. RD will order Magic Cups with meals to aid pt in meeting kcal and protein needs.  Meal Completion: 10-85%  Medications reviewed and include: Pro-stat BID, SSI, Lantus 72 units daily, Metformin, MVI with minerals,  torsemide, IV abx  Labs reviewed. CBG's: 97-153 x 24 hours  Diet Order:   Diet Order            Diet heart healthy/carb modified Room service appropriate? Yes; Fluid consistency: Thin  Diet effective now                 EDUCATION NEEDS:   No education needs have been identified at this time  Skin:  Skin Assessment: Reviewed RN Assessment  Last BM:  02/01/20 large type 7  Height:   Ht Readings from Last 1 Encounters:  01/30/20 5\' 6"  (1.676 m)    Weight:   Wt Readings from Last 1 Encounters:  02/01/20 93.5 kg    Ideal Body Weight:  59.1 kg  BMI:  Body mass index is 33.27 kg/m.  Estimated Nutritional Needs:   Kcal:  1800-2000  Protein:  100-115 grams  Fluid:  1.8-2.0 L    Gaynell Face, MS, RD, LDN Inpatient Clinical Dietitian Pager: [phone] Weekend/After Hours: [phone]

## 2020-02-01 NOTE — Progress Notes (Signed)
Occupational Therapy Discharge Summary  Patient Details  Name: Robin [name] MRN: [id number] Date of Birth: [birth year]  Today's Date: 02/01/2020 OT Individual Time: 0800-0900 OT Individual Time Calculation (min): 60 min   Patient in bed, alert and ready for therapy session.  She is pleasant and cooperative t/o session.  She denies pain and is able to indicate basic wants and needs with increased time and Y/N questions.  Bed mobility performed with CGA/min A.  Sit to stand, short distance ambulation with RW (right hand orthosis) and SPT to/from bed, toilet and w/c with CG/min A.  She performs adl tasks w/c level as documented below.  She was continent of bowel and bladder requiring increased time/min a for clothing management.  Completed right UE motor control and strengthening activities - reviewed HEP to promote ongoing functional use of right UE - she demonstrates good understanding.  She returned to bed at close of session CGA/min A.  Bed alarm set and call bell and tray table in reach.   Patient has met 12 of 13 long term goals due to improved activity tolerance, improved balance, postural control, functional use of  RIGHT upper and RIGHT lower extremity, improved awareness and improved coordination.  Patient to discharge at [facility] Assist level.  Patient's care partner is independent to provide the necessary physical assistance at discharge.    Reasons goals not met: patient continues to require assistance for self care tasks due to ongoing motor control deficits R UE/LE  Recommendation:  Patient will benefit from ongoing skilled OT services in home health setting to continue to advance functional skills in the area of BADL, iADL and Reduce care partner burden.  Equipment: No equipment provided  Reasons for discharge: treatment goals met  Patient/family agrees with progress made and goals achieved: Yes  OT Discharge Precautions/Restrictions  Precautions Precautions:  Fall Precaution Comments: R hemiparesis, R inattention Restrictions Weight Bearing Restrictions: No Pain Pain Assessment Pain Scale: 0-10 Pain Score: 0-No pain  ADL ADL Eating: Supervision/safety Where Assessed-Eating: Wheelchair Grooming: Supervision/safety Where Assessed-Grooming: Sitting at sink, Wheelchair Upper Body Bathing: Minimal assistance Where Assessed-Upper Body Bathing: Wheelchair Lower Body Bathing: Minimal assistance Where Assessed-Lower Body Bathing: Other (Comment), Wheelchair Upper Body Dressing: Minimal assistance Where Assessed-Upper Body Dressing: Wheelchair Lower Body Dressing: Moderate assistance Where Assessed-Lower Body Dressing: Wheelchair Toileting: Minimal assistance Where Assessed-Toileting: Teacher, adult education: Curator Method: Proofreader: Grab bars, Raised toilet seat Vision Baseline Vision/History: No visual deficits Additional Comments: denies diplopia or difficulty with seeing objects in environment, able to locate items as presented both right and left Perception  Perception: Impaired Inattention/Neglect: Does not attend to right visual field;Does not attend to right side of body Figure Ground: impaired figure ground when reaching for objects Spatial Orientation: impaired Praxis Praxis: Impaired Praxis Impairment Details: Initiation;Motor planning;Ideation Cognition Overall Cognitive Status: Impaired/Different from baseline Arousal/Alertness: Awake/alert Orientation Level: Oriented X4 (verbalized name, oriented to place, situation, and month when provided written choices (due to expressive language deficits)) Attention: Focused;Sustained Comments: able to follow directions for activities, expressive impairment presists - able to make basic needs known Sensation Sensation Light Touch: Impaired by gross assessment Proprioception: Impaired by gross assessment Coordination Gross  Motor Movements are Fluid and Coordinated: No Finger Nose Finger Test: left WFL, right unable 9 Hole Peg Test: unable, box and blocks L = 51, R (able to place 2 blocks over barrier with min a to grasp) Motor  Motor Motor: Hemiplegia Motor - Discharge Observations: ongoing weakness  and limited motor control right side Mobility  Bed Mobility Bed Mobility: Supine to Sit;Sit to Supine Supine to Sit: Minimal Assistance - Patient > 75% Sit to Supine: Minimal Assistance - Patient > 75% Transfers Sit to Stand: Contact Guard/Touching assist      Balance Static Sitting Balance Static Sitting - Level of Assistance: 5: Stand by assistance Dynamic Sitting Balance Dynamic Sitting - Level of Assistance: 5: Stand by assistance Static Standing Balance Static Standing - Level of Assistance: 4: Min assist Dynamic Standing Balance Dynamic Standing - Level of Assistance: 4: Min assist Extremity/Trunk Assessment RUE Assessment Passive Range of Motion (PROM) Comments: WFL Active Range of Motion (AROM) Comments: shoulder 1/4 against gravity in all directions, elbow 3/4 to full, wrist 3/4 to full, hand 1/2 - 3/4 - able to grasp items but has difficulty with maintaining and manipulation LUE Assessment LUE Assessment: Within Functional Limits   Carlos Levering 02/01/2020, 12:34 PM

## 2020-02-01 NOTE — Progress Notes (Signed)
[place] PHYSICAL MEDICINE & REHABILITATION PROGRESS NOTE   Subjective/Complaints: No complaints.  May d/c IV- leaving tomorrow. Will place order. Had loose continent BM today. One incontinent BM yesterday, also loose. Denies insomnia, pain  ROS: limited due to cognition  Objective:   No results found. Recent Labs    01/29/20 1302 01/30/20 0519  WBC 9.8 8.8  HGB 13.6 13.2  HCT 41.2 40.8  PLT 265 242   Recent Labs    01/29/20 1302 01/30/20 0519  NA 135 136  K 4.0 4.0  CL 102 104  CO2 19* 20*  GLUCOSE 289* 159*  BUN 22* 22*  CREATININE 1.03* 1.07*  CALCIUM 9.6 9.4    Intake/Output Summary (Last 24 hours) at 02/01/2020 1231 Last data filed at 02/01/2020 0840 Gross per 24 hour  Intake 884 ml  Output 0 ml  Net 884 ml     Physical Exam: Vital Signs Blood pressure 119/80, pulse 82, temperature 97.9 F (36.6 C), resp. rate 18, height 5\' 6"  (1.676 m), weight 93.5 kg, last menstrual period 08/21/2011, SpO2 98 %. General: Alert and oriented x 3, No apparent distress HEENT: Head is normocephalic, atraumatic, PERRLA, EOMI, sclera anicteric, oral mucosa pink and moist, dentition intact, ext ear canals clear,  Neck: Supple without JVD or lymphadenopathy Heart: Reg rate and rhythm. No murmurs rubs or gallops Chest: CTA bilaterally without wheezes, rales, or rhonchi; no distress Abdomen: Soft, non-tender, non-distended, bowel sounds positive. Extremities: No clubbing, cyanosis, or edema. Pulses are 2+ Skin: No evidence of breakdown, no evidence of rash  intact, motor strength is grossly 4- 5/5 in left . 3-/5 RIght  deltoid, bicep, tricep, grip, 5/5 left  3-/5 right hip flexor, knee extensors, ankle dorsiflexor and plantar flexor,2- right hip ext Musculoskeletal:No right ankle swelling, no TTP over ATFL, mild pain with passive supination RIght ankle   Assessment/Plan: 1. Functional deficits secondary to Cardioembolic Left MCA infarct  which require 3+ hours per day of  interdisciplinary therapy in a comprehensive inpatient rehab setting.  Physiatrist is providing close team supervision and 24 hour management of active medical problems listed below.  Physiatrist and rehab team continue to assess barriers to discharge/monitor patient progress toward functional and medical goals  Care Tool:  Bathing  Bathing activity did not occur: Refused Body parts bathed by patient: Right arm, Chest, Abdomen, Front perineal area, Right upper leg, Left upper leg, Face, Left lower leg, Right lower leg, Buttocks   Body parts bathed by helper: Left arm     Bathing assist Assist Level: Minimal Assistance - Patient > 75%     Upper Body Dressing/Undressing Upper body dressing   What is the patient wearing?: Pull over shirt    Upper body assist Assist Level: Minimal Assistance - Patient > 75%    Lower Body Dressing/Undressing Lower body dressing      What is the patient wearing?: Pants     Lower body assist Assist for lower body dressing: Moderate Assistance - Patient 50 - 74%     Toileting Toileting    Toileting assist Assist for toileting: Minimal Assistance - Patient > 75%     Transfers Chair/bed transfer  Transfers assist  Chair/bed transfer activity did not occur: Safety/medical concerns  Chair/bed transfer assist level: Minimal Assistance - Patient > 75% Chair/bed transfer assistive device: Programmer, multimedia   Ambulation assist   Ambulation activity did not occur: Safety/medical concerns  Assist level: Moderate Assistance - Patient 50 - 74% Assistive device: Hand  held assist Max distance: 45'   Walk 10 feet activity   Assist  Walk 10 feet activity did not occur: Safety/medical concerns  Assist level: 2 helpers Assistive device: Walker-rolling   Walk 50 feet activity   Assist Walk 50 feet with 2 turns activity did not occur: Safety/medical concerns (not yet making turns)         Walk 150 feet  activity   Assist Walk 150 feet activity did not occur: Safety/medical concerns         Walk 10 feet on uneven surface  activity   Assist Walk 10 feet on uneven surfaces activity did not occur: Safety/medical concerns         Wheelchair     Assist Will patient use wheelchair at discharge?:  (TBD)             Wheelchair 50 feet with 2 turns activity    Assist            Wheelchair 150 feet activity     Assist      Assist Level: Supervision/Verbal cueing   Blood pressure 119/80, pulse 82, temperature 97.9 F (36.6 C), resp. rate 18, height 5\' 6"  (1.676 m), weight 93.5 kg, last menstrual period 08/21/2011, SpO2 98 %.  Medical Problem List and Plan: 1.  Right-sided hemiparesis and aphasia secondary to left MCA infarction/petechial hemorrhage with saccular aneurysm at the paraclinoid right ICA status post mechanical thrombectomy  Continue CIR PT, OT, SLP   WHO/PRAFO qhs  2.  Antithrombotics: -DVT/anticoagulation: Eliquis             -antiplatelet therapy: N/A 3. Pain Management: Neurontin 100 mg every 8 hours, Zanaflex 2 mg twice daily as needed  6/29- no complaints             Monitor with increased exertion 4. Mood: Provide emotional support- cannot assess accurately related to severe aphasia, empirically started on citalopram for "flat affect"  Which may be MCA infarct effect              -antipsychotic agents: N/A 5. Neuropsych: This patient is not capable of making decisions on her own behalf. 6. Skin/Wound Care: Routine skin checks 7. Fluids/Electrolytes/Nutrition: Routine in and outs.  Oral intake improving 75-100% will Drinking  fluids  967ml 6/23 8.  Post stroke dysphagia.    Dysphagia #1 pudding thick liquids.  Follow-up speech therapy.  6/26- has NGT in place- sedated so not drinking- will recheck labs in AM and see if dehydrated due to sedation- will monitor 6/27: Creatinine trending upward slightly.   Advance diet as tolerated 9.   Hypertension.  Coreg 50 mg twice daily,Bidil 20-37.5 mg 2 tabs 3 times daily            Vitals:   01/31/20 1935 02/01/20 0531  BP: 105/65 119/80  Pulse: 91 82  Resp: 17 18  Temp: 98.4 F (36.9 C) 97.9 F (36.6 C)  SpO2: 93% 98%   6/28: Low. Decrease Bidil to 1 tab TID  6/29: Better controlled, still low 10.  Atrial fibrillation.  Lanoxin 0.125 mg daily.  Cardiac rate controlled on Eliquis 11.  Chronic diastolic congestive heart failure.  Demadex 20 mg daily.  Monitor for any signs of fluid overload Filed Weights   01/29/20 0523 01/30/20 0419 02/01/20 0500  Weight: 95.7 kg 94.5 kg 93.5 kg   Stable weights, overall fluid intake reduced now that TF is off   6/27- weight down another kg- not sure if drinking,  but Cr stable and BUN stable at 22- Cr 1.07 12.  Diabetes mellitus with peripheral neuropathy.  Hemoglobin A1c 11.6.  NovoLog 2 units every 4 hours, Lantus insulin 60 units nightly, Glucophage 500 mg twice daily CBG (last 3)  Recent Labs    01/31/20 2133 02/01/20 0632 02/01/20 1201  GLUCAP 132* 97 155*   .   6/20: am CBG ok on Lantus  72U.  Metformin 1000mg  BID started 6/24- monitor for now   6/26- BGs elevated- likely due to UTI- con't regimen and do SSI as needed right now  6/27- BGs MUCH better today- likely due to treating UTI- 138-209 (yesterday 200-300)  6/28: well controlled              Monitor with increased mobility 13.  COPD with tobacco abuse.  Continue nebulizer treatments 14.  Polysubstance abuse.  Urine drug screen positive cocaine.    Counsel when appropriate 15.  Hyperlipidemia.  Crestor 16.  GERD.  Protonix 17.  AKI Creat ok mainly prerenal azotemia with elevated BUN - no need to hold metformin at this time  18. RIght  ATFL sprain, order ankle splint, may use Ice , Remove at noc  19.  Urinary retention may have diabetic cystopathy  Improved  Off urecholine and flomax  20. UTI  6/26- will add Rocephin- PCN "allergy is nausea" per notes-will monitor for  side effects- U Cx pending.  6/27- U Cx shows 100k Klebsiella and 60k E Coli- sensitvities are PENDING  ; cont' Rocphin until results back.    LOS: 27 days A FACE TO FACE EVALUATION WAS PERFORMED  Izora Ribas 02/01/2020, 12:31 PM

## 2020-02-01 NOTE — Progress Notes (Signed)
Physical Therapy Discharge Summary  Patient Details  Name: Robin [name] MRN: [id number] Date of Birth: 1966/01/04  Today's Date: 02/01/2020 PT Individual Time: 7829-5621 and 3086-5784 PT Individual Time Calculation (min): 47 min and 61 min   Patient has met 7 of 8 long term goals due to improved activity tolerance, improved balance, improved postural control, increased strength, decreased pain, ability to compensate for deficits, functional use of  right upper extremity and right lower extremity, improved attention, improved awareness and improved coordination.  Patient to discharge at a wheelchair level requiring [place] for transfers. Patient's care partners attended hands-on training/education and are independent to provide the necessary physical and cognitive assistance at discharge.  Reasons goals not met: Patient continues to require min assist for bed mobility from a flat bed without bedrails due to core muscle weakness.  Recommendation:  Patient will benefit from ongoing skilled PT services in home health setting to continue to advance safe functional mobility, address ongoing impairments in R UE and LE strength, core strength, R LE NMR, standing balance, gait, R attention, and minimize fall risk.   Equipment: wheelchair and RW  Reasons for discharge: treatment goals met and discharge from hospital  Patient/family agrees with progress made and goals achieved: Yes  Skilled Therapeutic Interventions/Progress Updates:  Session 1: Pt received supine in bed and agreeable to therapy session. Orthopedic team present to provide and don abdominal binder. Pt wearing abdominal binder and B LE thigh high TED hose throughout entire session. Pt suddenly reports urgent need to use bathroom. Supine>sitting R EOB with min assist for trunk upright. L squat pivot to w/c with CGA for safety. Transported in/out of bathroom in w/c. Stand pivot w/c<>toilet using grab bar with min assist for balance -  max assist for LB clothing management due to urgency. Continent of bladder - seated peri-care without assist. Seated hand hygiene at sink with pt demonstrating increased movement in R hand and increased incorporation of that hand in this task. Donned R ankle brace max assist. Transported to/from gym in w/c for time management and energy conservation. Assessed vitals in sitting: BP 113/67 (MAP 80), HR 81bpm Gait training 67f x2, seated break between, using RW with min assist of 1 Hoffer - improving R LE foot clearance with increasing hip/knee flexion despite still having some foot drag, increasing R LE step length, and improving R LE alignment with decreased hip external rotation - though these things are improving still need continued improvement for safe functional ambulation. Assessed vitals after 1st walk: BP 120/82 (MAP 94), HR 98bpm Transported back to room and pt requesting to return to bed. L lateral scoot transfer w/c>EOB with CGA for steadying. Sit>supine with min assist for R LE management into the bed. Pt left sitting in bed with HOB elevated, needs in reach, and bed alarm on.  Session 2: Pt received supine in bed with her sister, [name], and the RN present to perform bladder scan. Pt agreeable to therapy session. Therapist reviewed information from yesterday's education/training session regarding: thigh high TEDs, abdominal binder, R ankle brace (how to don/doff and wearing for all standing/WBing activities), recommended DME of wheelchair and RW with R hand orthotic, pt's CLOF (performing stand pivot transfers using RW with CGA/min assist; currently no ambulating at home with family) and recommended follow-up therapy. [name] family confirmed she will have a ramped entrance into home and doorway widths are 30inches. Pt reporting need to use bathroom urgently. Supine>sitting R EOB with min assist for trunk upright. Gait ~184f  x2 to/from bathroom using RW with min assist for balance and AD management - cuing  for sequencing of AD management and LE stepping especially over doorway threshold. Sit<>stand to/from toilet using RW with CGA for steadying. Continent of bladder - seated peri-care without assist. Standing hand hygiene at sink with max cuing and assist for proper AD management at counter and min assist for balance as she continues to have R lateral lean - pt appeared to not be feeling well after standing at sink so sat in w/c and assessed vitals: BP 126/81 (MAP 94), HR 87bpm (pt wearing B thigh high TEDs and abdominal binder during session). [name] daughter, DD, arrived for hands-on training/education. Therapist had pt's family demonstrate understanding of training yesterday for stand pivot transfers w/c<>EOB using RW and supine<>sit - pt/family performed transfer and bed mobility with min cuing from therapist and pt requiring CGA/min assist during transfer and min assist during bed mobility. Pt family also demonstrated understanding of w/c parts without cuing. [name] family reports no questions/concerns.  Transported to/from gym in w/c for time management and energy conservation. Simulated car transfer, sedan height, via stand pivot to/from w/c using RW - mod cuing for proper set-up of transfer but then pt performed transfer with family demonstrating proper min assist with min cuing from therapist. Performed block practice stand pivot transfer to/from EOM using RW with education on improving R LE lateral step length when transferring to the R but despite therapist providing visual demonstration and max cuing pt continues to lack adequate R lateral step length but this does not cause unsteady balance it just causes decreased R LE weightbearing. Transported back to room in w/c. Family properly set-up and assisted pt with stand pivot transfer back to bed using RW without cuing from therapist. Sit>supine with increased time for R LE management into the bed. [name] family reports no questions/concerns and demonstrated  ability to properly and safely assist patient at home. Pt left supine in bed with needs in reach and RN arriving to assume care of patient.   PT Discharge Precautions/Restrictions Precautions Precautions: Fall;Other (comment) Precaution Comments: R hemiparesis, R inattention Restrictions Weight Bearing Restrictions: No Pain Pain Assessment Pain Scale: Faces Faces Pain Scale: Hurts a little bit Pain Type: Acute pain Pain Location: Ankle Pain Orientation: Right;Medial Pain Descriptors / Indicators: Aching Pain Onset: On-going Pain Intervention(s): Rest;Repositioned Multiple Pain Sites: No Perception  Perception Perception: Impaired Inattention/Neglect: Does not attend to right visual field;Does not attend to right side of body Figure Ground: impaired figure ground when reaching for objects though significantly improved Spatial Orientation: impaired Praxis Praxis: Impaired Praxis Impairment Details: Initiation;Motor planning;Ideation  Cognition  Overall Cognitive Status: Impaired/Different from baseline Arousal/Alertness: Awake/alert Attention: Focused;Sustained Focused Attention: Impaired Sustained Attention: Impaired Safety/Judgment: Impaired Sensation Sensation Light Touch: Impaired by gross assessment (unable to formally assess due to impaired ability to follow commands, inattention, and aphasia) Hot/Cold: Not tested Proprioception: Impaired by gross assessment (unable to formally assess due to impaired ability to follow commands, inattention, and aphasia) Stereognosis: Not tested Coordination Gross Motor Movements are Fluid and Coordinated: No Coordination and Movement Description: impaired due to R inattention, R hemiparesis, R hemibody impaired proprioception, impaired motor planning and coordination Heel Shin Test: impaired R LE compared to L with decreased speed but able to follow commands to perform task today Motor  Motor Motor: Hemiplegia Motor - Discharge  Observations: ongoing paresis and limited motor control on right side though improved significantly  Mobility Bed Mobility Bed Mobility: Sit to Supine;Supine to  Sit Supine to Sit: Minimal Assistance - Patient > 75% Sit to Supine: Contact Guard/Touching assist Transfers Transfers: Sit to Stand;Stand to Sit;Stand Pivot Transfers Sit to Stand: Contact Guard/Touching assist Stand to Sit: Contact Guard/Touching assist Stand Pivot Transfers: Contact Guard/Touching assist;Minimal Assistance - Patient > 75% Stand Pivot Transfer Details: Tactile cues for sequencing;Visual cues for safe use of DME/AE;Visual cues/gestures for sequencing;Verbal cues for sequencing Transfer (Assistive device): Rolling walker Locomotion  Gait Ambulation: Yes Gait Assistance: Minimal Assistance - Patient > 75% Gait Distance (Feet): 60 Feet Assistive device: Rolling walker Gait Assistance Details: Tactile cues for sequencing;Tactile cues for initiation;Tactile cues for weight shifting;Tactile cues for posture;Visual cues for safe use of DME/AE Gait Gait: Yes Gait Pattern: Impaired Gait Pattern: Poor foot clearance - left;Decreased dorsiflexion - right;Decreased stride length;Decreased stance time - right;Decreased step length - right;Decreased trunk rotation Stairs / Additional Locomotion Stairs: No Ramp: Other (comment) (dependent transport in w/c) Wheelchair Mobility Wheelchair Mobility: No  Trunk/Postural Assessment  Cervical Assessment Cervical Assessment: Exceptions to [facility] (improved with head in midilne but still slight cervical protraction) Thoracic Assessment Thoracic Assessment: Exceptions to [facility] (slight thoracic rounding) Lumbar Assessment Lumbar Assessment: Exceptions to [facility] (posterior pelvic tilt in sitting) Postural Control Postural Control: Deficits on evaluation Trunk Control: improving though continues to have R drift in standing due to progressive R knee flexion in standing from inattantion,  poor muscle endurance, and impaired dual-task abilities Righting Reactions: continues to rely on L UE to pull herself back to midline after R lateral LOB Postural Limitations: decreased due to R hemiparesis and impaired trunk control  Balance Static Sitting Balance Static Sitting - Balance Support: Left upper extremity supported Static Sitting - Level of Assistance: 5: Stand by assistance Dynamic Sitting Balance Dynamic Sitting - Balance Support: Left upper extremity supported Dynamic Sitting - Level of Assistance: 5: Stand by assistance Static Standing Balance Static Standing - Balance Support: Bilateral upper extremity supported;During functional activity Static Standing - Level of Assistance: Other (comment);4: Min assist (CGA) Dynamic Standing Balance Dynamic Standing - Balance Support: During functional activity;Bilateral upper extremity supported Dynamic Standing - Level of Assistance: 4: Min assist Extremity Assessment       RLE Assessment RLE Assessment: Exceptions to [facility] RLE Strength Right Hip Flexion: 3/5 Right Knee Flexion: 3/5 Right Knee Extension: 3+/5 Right Ankle Dorsiflexion: 2/5 Right Ankle Plantar Flexion: 2/5 RLE Tone RLE Tone: Hypotonic LLE Assessment LLE Assessment: Within Functional Limits Active Range of Motion (AROM) Comments: WFL for tasks assessed General Strength Comments: WFL LLE Tone LLE Tone: Within Functional Limits    Tawana Scale, PT, DPT, CSRS 02/01/2020, 7:57 AM

## 2020-02-01 NOTE — Progress Notes (Signed)
Speech Language Pathology Discharge Summary  Patient Details  Name: Robin [name] MRN: [id number] Date of Birth: Dec 17, 1965  Today's Date: 02/01/2020 SLP Individual Time: 4353-9122 SLP Individual Time Calculation (min): 41 min   Skilled Therapeutic Interventions:  Pt was seen for skilled ST targeting cognitive-linguistic goals. Pt oriented X4 - verbalized name and when provided with 2-4 written choices she accurately oriented to place, situation, and month. During automatic speech tasks, pt counted from 1-10 with 80% accuracy when provided only a visual (written) and Mod A phonetic cues. Increased Max A + visual written support required to verbalized the letters of the alphabet with ~25% accuracy. Pt verbalized at word level with 4/7 accuracy and approximations of remaining 3 words during phrase completion tasks when provided Max A multimodal cues. Pt left sitting in bed with alarm set and needs within reach. Continue per current plan of care.     Patient has met 7 of 8 long term goals.  Patient to discharge at overall Min;Supervision;Max level.  Reasons goals not met: n/a   Clinical Impression/Discharge Summary:   Pt made excelleng functional gains and met 7 out of 8 long term goals this admission. Pt currently requires Min assist to express her basic wants and needs via gestures (primarily pointing, other hand gestures, and eye gaze). Increased Max A is required for expressing wants and needs and functional word level communication due to severe expressive aphasia that is fluent but characterized by neologisms. Suspect influence of apraxia of speehc as well. She only requires Min A for basic problem solving, sustained attention, and awareness of functional errors. Due to level of assistance for cognition and communication, pt will required 24/7 supervision at discharge. Pt is consuming upgraded regular diet with thin liquids and is Mod I-Supervision for use of safe swallow strategies. Given  cognitive communication deficits still present, recommend pt continue to receive skilled [place] services upon discharge. Pt and family education is complete at this time.       Care Partner:  Caregiver Able to Provide Assistance: Yes  Type of Caregiver Assistance: Cognitive  Recommendation:  Home Health SLP;24 hour supervision/assistance  Rationale for SLP Follow Up: Maximize functional communication;Maximize cognitive function and independence;Reduce caregiver burden   Equipment: none   Reasons for discharge: Discharged from hospital   Patient/Family Agrees with Progress Made and Goals Achieved: Yes    Arbutus Leas 02/01/2020, 7:29 AM

## 2020-02-02 LAB — CBC
HCT: 36.4 % (ref 36.0–46.0)
Hemoglobin: 12 g/dL (ref 12.0–15.0)
MCH: 28.5 pg (ref 26.0–34.0)
MCHC: 33 g/dL (ref 30.0–36.0)
MCV: 86.5 fL (ref 80.0–100.0)
Platelets: 260 10*3/uL (ref 150–400)
RBC: 4.21 MIL/uL (ref 3.87–5.11)
RDW: 13.2 % (ref 11.5–15.5)
WBC: 8.9 10*3/uL (ref 4.0–10.5)
nRBC: 0 % (ref 0.0–0.2)

## 2020-02-02 LAB — GLUCOSE, CAPILLARY: Glucose-Capillary: 130 mg/dL — ABNORMAL HIGH (ref 70–99)

## 2020-02-02 MED ORDER — CEPHALEXIN 250 MG PO CAPS
250.0000 mg | ORAL_CAPSULE | Freq: Three times a day (TID) | ORAL | Status: DC
  Administered 2020-02-02: 250 mg via ORAL
  Filled 2020-02-02: qty 1

## 2020-02-02 NOTE — Progress Notes (Signed)
Inpatient Rehabilitation Care Coordinator  Discharge Note  The overall goal for the admission was met for:   Discharge location: Yes, home  Length of Stay: Yes, 28 Days  Discharge activity level: Yes, Min A  Home/community participation: Yes  Services provided included: MD, RD, PT, OT, SLP, RN, CM, TR, Pharmacy, Neuropsych and SW  Financial Services: Private Insurance: [place]  Follow-up services arranged: Home Health: [facility]  Comments (or additional information): PT OT ST Aide  Patient/Family verbalized understanding of follow-up arrangements: Yes  Individual responsible for coordination of the follow-up plan: [name] [phone]  Confirmed correct DME delivered: Dyanne Iha 02/02/2020    Dyanne Iha

## 2020-02-02 NOTE — Progress Notes (Signed)
Patient ID: Robin [name], female   DOB: [birth year], 54 y.o.   MRN: [id number]   Sw received notification WC order was cancelled.

## 2020-02-02 NOTE — Progress Notes (Signed)
Patient ID: Robin [name], female   DOB: 10/21/1965, 54 y.o.   MRN: [id number]  [employer] attempted to deliver [facility] yesterday, didnt have a card to make a payment. [employer] will follow up again this morning and see if she is able to pay now.

## 2020-02-02 NOTE — Progress Notes (Signed)
Intranasal packing removed by Neoma Laming, RN

## 2020-02-02 NOTE — Progress Notes (Signed)
[place] PHYSICAL MEDICINE & REHABILITATION PROGRESS NOTE   Subjective/Complaints:  Has Rhino Rocket Left nares,  Persistent global aphasia, alert and follows simple commands such as MMT   ROS: limited due to cognition  Objective:   No results found. Recent Labs    02/02/20 0608  WBC 8.9  HGB 12.0  HCT 36.4  PLT 260   No results for input(s): NA, K, CL, CO2, GLUCOSE, BUN, CREATININE, CALCIUM in the last 72 hours.  Intake/Output Summary (Last 24 hours) at 02/02/2020 0842 Last data filed at 02/01/2020 1849 Gross per 24 hour  Intake 544 ml  Output --  Net 544 ml     Physical Exam: Vital Signs Blood pressure 114/63, pulse 88, temperature 98.3 F (36.8 C), resp. rate 19, height 5\' 6"  (1.676 m), weight 93.5 kg, last menstrual period 08/21/2011, SpO2 99 %.  General: No acute distress Mood and affect are appropriate HEENT Rhinorocket left nares Heart: Regular rate and rhythm no rubs murmurs or extra sounds Lungs: Clear to auscultation, breathing unlabored, no rales or wheezes Abdomen: Positive bowel sounds, soft nontender to palpation, nondistended Extremities: No clubbing, cyanosis, or edema Skin: No evidence of breakdown, no evidence of rash  Musculoskeletal: Full range of motion in all 4 extremities. No joint swelling  intact, motor strength is grossly 4- 5/5 in left . 3-/5 RIght  deltoid, bicep, tricep, grip, 5/5 left  3-/5 right hip flexor, knee extensors, ankle dorsiflexor and plantar flexor,2- right hip ext Musculoskeletal:No right ankle swelling, no TTP over ATFL, mild pain with passive supination RIght ankle   Assessment/Plan: 1. Functional deficits secondary to Cardioembolic Left MCA infarct   Stable for D/C today F/u PCP in 3-4 weeks F/u PM&R 2 weeks See D/C summary See D/C instructions Care Tool:  Bathing  Bathing activity did not occur: Refused Body parts bathed by patient: Right arm, Chest, Abdomen, Front perineal area, Right upper leg, Left upper  leg, Face, Left lower leg, Right lower leg, Buttocks   Body parts bathed by helper: Left arm     Bathing assist Assist Level: Minimal Assistance - Patient > 75%     Upper Body Dressing/Undressing Upper body dressing   What is the patient wearing?: Pull over shirt    Upper body assist Assist Level: Minimal Assistance - Patient > 75%    Lower Body Dressing/Undressing Lower body dressing      What is the patient wearing?: Pants     Lower body assist Assist for lower body dressing: Moderate Assistance - Patient 50 - 74%     Toileting Toileting    Toileting assist Assist for toileting: Minimal Assistance - Patient > 75%     Transfers Chair/bed transfer  Transfers assist  Chair/bed transfer activity did not occur: Safety/medical concerns  Chair/bed transfer assist level: Minimal Assistance - Patient > 75% Chair/bed transfer assistive device: Programmer, multimedia   Ambulation assist   Ambulation activity did not occur: Safety/medical concerns  Assist level: Minimal Assistance - Patient > 75% Assistive device: Walker-rolling Max distance: 46ft   Walk 10 feet activity   Assist  Walk 10 feet activity did not occur: Safety/medical concerns  Assist level: Minimal Assistance - Patient > 75% Assistive device: Walker-rolling   Walk 50 feet activity   Assist Walk 50 feet with 2 turns activity did not occur: Safety/medical concerns (not yet making turns)  Assist level: Minimal Assistance - Patient > 75% Assistive device: Walker-rolling    Walk 150 feet activity  Assist Walk 150 feet activity did not occur: Safety/medical concerns         Walk 10 feet on uneven surface  activity   Assist Walk 10 feet on uneven surfaces activity did not occur: Safety/medical concerns         Wheelchair     Assist Will patient use wheelchair at discharge?: No (dependent w/c transport as pt will soon be functional ambulator)              Wheelchair 50 feet with 2 turns activity    Assist            Wheelchair 150 feet activity     Assist      Assist Level: Supervision/Verbal cueing   Blood pressure 114/63, pulse 88, temperature 98.3 F (36.8 C), resp. rate 19, height 5\' 6"  (1.676 m), weight 93.5 kg, last menstrual period 08/21/2011, SpO2 99 %.  Medical Problem List and Plan: 1.  Right-sided hemiparesis and aphasia secondary to left MCA infarction/petechial hemorrhage with saccular aneurysm at the paraclinoid right ICA status post mechanical thrombectomy  Stable for D/C  2.  Antithrombotics: -DVT/anticoagulation: Eliquis             -antiplatelet therapy: N/A 3. Pain Management: Neurontin 100 mg every 8 hours, Zanaflex 2 mg twice daily as needed  6/29- no complaints             Monitor with increased exertion 4. Mood: Provide emotional support- cannot assess accurately related to severe aphasia, empirically started on citalopram for "flat affect"  Which may be MCA infarct effect              -antipsychotic agents: N/A 5. Neuropsych: This patient is not capable of making decisions on her own behalf. 6. Skin/Wound Care: Routine skin checks 7. Fluids/Electrolytes/Nutrition: Routine in and outs.  Oral intake improving 75-100% will Drinking  fluids  978ml 6/23 8.  Post stroke dysphagia.    Dysphagia #1 pudding thick liquids.  Follow-up speech therapy.  6/26- has NGT in place- sedated so not drinking- will recheck labs in AM and see if dehydrated due to sedation- will monitor 6/27: Creatinine trending upward slightly.   Advance diet as tolerated 9.  Hypertension.  Coreg 50 mg twice daily,Bidil 20-37.5 mg 2 tabs 3 times daily            Vitals:   02/01/20 1935 02/02/20 0510  BP: 124/62 114/63  Pulse: 92 88  Resp: 16 19  Temp: 98.4 F (36.9 C) 98.3 F (36.8 C)  SpO2: 100% 99%   6/28: Low. Decrease Bidil to 1 tab TID  6/29: Better controlled, still low 10.  Atrial fibrillation.  Lanoxin 0.125 mg  daily.  Cardiac rate controlled on Eliquis 11.  Chronic diastolic congestive heart failure.  Demadex 20 mg daily.  Monitor for any signs of fluid overload Filed Weights   01/29/20 0523 01/30/20 0419 02/01/20 0500  Weight: 95.7 kg 94.5 kg 93.5 kg   Stable weights, overall fluid intake reduced now that TF is off   6/27- weight down another kg- not sure if drinking, but Cr stable and BUN stable at 22- Cr 1.07 12.  Diabetes mellitus with peripheral neuropathy.  Hemoglobin A1c 11.6.  NovoLog 2 units every 4 hours, Lantus insulin 60 units nightly, Glucophage 500 mg twice daily CBG (last 3)  Recent Labs    02/01/20 1653 02/01/20 2109 02/02/20 0614  GLUCAP 125* 190* 130*  6/30 well controlled  Monitor with increased mobility 13.  COPD with tobacco abuse.  Continue nebulizer treatments 14.  Polysubstance abuse.  Urine drug screen positive cocaine.    Counsel when appropriate 15.  Hyperlipidemia.  Crestor 16.  GERD.  Protonix 17.  AKI Creat ok mainly prerenal azotemia with elevated BUN - no need to hold metformin at this time  18. RIght  ATFL sprain, order ankle splint, may use Ice , Remove at noc  19.  Urinary retention may have diabetic cystopathy  Improved  Off urecholine and flomax  20. UTI  6/26- will add Rocephin- PCN "allergy is nausea" per notes-will monitor for side effects- U Cx pending.  6/27- U Cx shows 100k Klebsiella and 60k E Coli- sensitvities are PENDING  ; DC Rocephin - fincish course with 3 d keflex 250mg  TID   LOS: 28 days A FACE TO Darby E Sharnice Bosler 02/02/2020, 8:42 AM

## 2020-02-03 ENCOUNTER — Telehealth: Payer: Self-pay | Admitting: Registered Nurse

## 2020-02-03 ENCOUNTER — Telehealth: Payer: Self-pay

## 2020-02-03 NOTE — Telephone Encounter (Signed)
Transitional Care call Transitional Questions Answered by Ms. [name]: Sister  Patient name: Robin [name] DOB: January 05, 1966 1. Are you/is patient experiencing any problems since coming home? Yes, she reports Robin [name] was discharged without a wheelchair. Also states she was in contact with Robin [name] regarding the matter. She was instructed to F/U with Robin [name] regarding the wheelchair matter, she verbalizes understanding.  a. Are there any questions regarding any aspect of care? No 2. Are there any questions regarding medications administration/dosing? No a. Are meds being taken as prescribed? Yes, she had question regarding the Lantus 72 units. This provider was in contact with Robin Hoff PA, he reports she was on the Lantus in the hospital and he spoke with family regarding the above.  b. "Patient should review meds with caller to confirm" Medication List Reviewed.  3. Have there been any falls? No 4. Has Home Health been to the house and/or have they contacted you? Yes, [facility] is scheduled to come to her home on today, she reports.  a. If not, have you tried to contact them? NA b. Can we help you contact them? NA 5. Are bowels and bladder emptying properly? Yes a. Are there any unexpected incontinence issues? No b. If applicable, is patient following bowel/bladder programs? NA 6. Any fevers, problems with breathing, unexpected pain? No 7. Are there any skin problems or new areas of breakdown? No 8. Has the patient/family member arranged specialty MD follow up (ie cardiology/neurology/renal/surgical/etc.)?  Robin [name] was instructed to call [facility] Neurology, Dr Earleen Newport and to call Robin [name] PCP to schedule HFU appointment, she verbalizes understanding.  a. Can we help arrange? No 9. Does the patient need any other services or support that we can help arrange? No 10. Are caregivers following through as expected in assisting the patient? Yes 11. Has the patient quit smoking, drinking  alcohol, or using drugs as recommended? (                        )  Appointment date/time 02/14/2020  arrival time 11:00 for 11:20 appointment with Bayard Hugger ANP-C. At [facility]

## 2020-02-03 NOTE — Telephone Encounter (Signed)
Sw received call that patient did not receive wheelchair. Sw informed sister that sw received notification that the order was canceled. Sw will follow up with [employer] to give the sister a call to explain billing and payment. Sister also informed sw that patient that patient insulin script was written for 72 units, sister wants to ensure this is correct-will follow up with MD/PA.

## 2020-02-04 ENCOUNTER — Telehealth: Payer: Self-pay

## 2020-02-04 NOTE — Telephone Encounter (Signed)
Patient sister called stating she has questions about medications that patient was discharged with.

## 2020-02-04 NOTE — Telephone Encounter (Signed)
Patient did not receive all her prescribed discharge medications from [facility].  Isosorbide-hydralazine, multi- vit, & tylenol where not in her bag.   Patient Sister had been advised to call the [facility] for Isosorbide-hydralazine. However, Tylenol & Multi-vit can be purchased OTC.

## 2020-02-08 ENCOUNTER — Other Ambulatory Visit (HOSPITAL_COMMUNITY): Payer: Self-pay | Admitting: Neuroradiology

## 2020-02-08 DIAGNOSIS — I671 Cerebral aneurysm, nonruptured: Secondary | ICD-10-CM

## 2020-02-14 ENCOUNTER — Encounter: Payer: Self-pay | Admitting: Registered Nurse

## 2020-02-14 ENCOUNTER — Other Ambulatory Visit: Payer: Self-pay

## 2020-02-14 ENCOUNTER — Encounter: Payer: 59 | Attending: Registered Nurse | Admitting: Registered Nurse

## 2020-02-14 VITALS — BP 110/81 | HR 100 | Temp 97.7°F | Ht 66.0 in | Wt 202.8 lb

## 2020-02-14 DIAGNOSIS — G8191 Hemiplegia, unspecified affecting right dominant side: Secondary | ICD-10-CM | POA: Insufficient documentation

## 2020-02-14 DIAGNOSIS — F1410 Cocaine abuse, uncomplicated: Secondary | ICD-10-CM | POA: Insufficient documentation

## 2020-02-14 DIAGNOSIS — I10 Essential (primary) hypertension: Secondary | ICD-10-CM | POA: Diagnosis not present

## 2020-02-14 DIAGNOSIS — I63512 Cerebral infarction due to unspecified occlusion or stenosis of left middle cerebral artery: Secondary | ICD-10-CM

## 2020-02-14 DIAGNOSIS — Z794 Long term (current) use of insulin: Secondary | ICD-10-CM | POA: Insufficient documentation

## 2020-02-14 DIAGNOSIS — E1142 Type 2 diabetes mellitus with diabetic polyneuropathy: Secondary | ICD-10-CM | POA: Diagnosis not present

## 2020-02-14 DIAGNOSIS — N1831 Chronic kidney disease, stage 3a: Secondary | ICD-10-CM | POA: Insufficient documentation

## 2020-02-14 DIAGNOSIS — E1121 Type 2 diabetes mellitus with diabetic nephropathy: Secondary | ICD-10-CM | POA: Diagnosis present

## 2020-02-14 DIAGNOSIS — E1122 Type 2 diabetes mellitus with diabetic chronic kidney disease: Secondary | ICD-10-CM

## 2020-02-14 NOTE — Progress Notes (Signed)
Subjective:    Patient ID: Robin [name], female    DOB: 26-Jun-1966, 54 y.o.   MRN: [id number]  HPI: Robin [name] is a 54 y.o. female who is here for a Transitional Care visit in follow up of her Left Middle Cerebral Artery Stroke, Right Hemiparesis, Essential Hypertension, Type 2 DM with CKD with long-term insulin use, Diabetic Peripheral Neuropathy and Polysubstance abuse. She presented to [facility] on 12/26/2019 with right- sided weakness, altered mental status and aphasia. Neurology was consulted.  CT Head WO Contrast:  IMPRESSION: 1. Question evolving hypodensity involving the left basal ganglia, left insula, and adjacent left temporal lobe, which could reflect an evolving left MCA territory infarct. Additionally, there is question of asymmetric hyperdensity at the left ICA terminus, which could reflect intraluminal thrombus/LVO. Finding somewhat difficult to be certain given at there is some motion and streak artifact on this exam. Further assessment with dedicated CTA of the head and neck recommended. CT Angio Head and Neck:  IMPRESSION: CTA HEAD AND NECK IMPRESSION:  1. Positive CTA with occlusive thrombus at the left ICA terminus. Distal reconstitution at the mid and distal left M1 segment with attenuated flow distally within the left MCA branches, which could be related to subocclusive thrombus and/or collateralization. 2. Mild atheromatous change about the left carotid bifurcation without stenosis. No other hemodynamically significant stenosis involving the left carotid system. 3. Multifocal moderate to severe stenoses involving the hypoplastic left vertebral artery, most notable proximally. Single short-segment severe proximal right V1 stenosis. Dominant right vertebral artery otherwise widely patent. 4. 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  CT PERFUSION IMPRESSION:  1. 24 cc acute core infarct involving the deep and subcortical  white matter of the anterior left frontal region. Two hundred fourteen cc surrounding ischemic penumbra involving essentially the entirety of the left MCA distribution. 2. Aspects = 6 on prior head CT.  Critical Value/emergent results were called by telephone at the time of interpretation on 12/26/2019 at 11:48 pm to provider Dr. Tomi Bamberger, Who verbally acknowledged these results. Robin [name] was [facility] to [facility], per Dr Erlinda Hong note:  Embolic cerebral infarction [facility]) s/p clot retrieval on 12/28/2019 with Dr Estanislado Pandy.  Robin [name] was admitted to Inpatient Rehabilitation on 01/05/2020 and discharged home on 02/02/2020. She is receiving Home Health Therapy from [facility]. Robin [name] speech was gibberish, after some  delay she was able to state her first name. She pointed to her right wrist and left eye when asked if she was having pain anywhere. Her Health and History list her pain as  5. Her sister Robin [name] in room and reports she has a good appetite.   Robin [name] reports two weeks ago she noted a skin irritation with the depends they purchased, they stopped using the depends. States they were using the barrier cream from hospital and Gold bond powder, she was instructed to discontinue the Gold Bond powder. Skin noted with irritation, no drainage was noted. Robin [name] was instructed to use A%D ointment, if Robin [name] is incontinent to use Desitin. Robin [name] has a scheduled appointment with her PCP on  02/16/2020 for further evaluation, she verbalizes understanding. .   Pain Inventory Average Pain 7 Pain Right Now 5 My pain is intermittent and aching  In the last 24 hours, has pain interfered with the following? General activity 8 Relation with others 7 Enjoyment of life 4 What TIME of day is your pain at its  worst? daytime Sleep (in general) Fair  Pain is worse with: unsure Pain improves with: medication Relief from Meds: 5  Mobility use a walker how many minutes can you walk? 10  mins ability to climb steps?  yes do you drive?  no Do you have any goals in this area?  yes  Function not employed: date last employed Not working before stroke.  I need assistance with the following:  dressing, bathing, toileting and meal prep Do you have any goals in this area?  yes  Neuro/Psych No problems in this area  Prior Studies New patient.   Physicians involved in your care New patient.   Family History  Problem Relation Age of Onset  . Cirrhosis Mother   . Early death Mother   . Alcohol abuse Mother   . Diabetes type II Father   . Alcohol abuse Father   . Diabetes type II Sister   . Early death Brother   . Alcohol abuse Son   . Anesthesia problems Neg Hx   . Hypotension Neg Hx   . Malignant hyperthermia Neg Hx   . Pseudochol deficiency Neg Hx   . Colon cancer Neg Hx   . Colon polyps Neg Hx   . Esophageal cancer Neg Hx   . Rectal cancer Neg Hx   . Stomach cancer Neg Hx    Social History   Socioeconomic History  . Marital status: Single    Spouse name: Not on file  . Number of children: Not on file  . Years of education: Not on file  . Highest education level: Not on file  Occupational History  . Not on file  Tobacco Use  . Smoking status: Former Smoker    Packs/day: 0.25    Years: 29.00    Pack years: 7.25    Types: Cigarettes  . Smokeless tobacco: Never Used  . Tobacco comment: 1 cigarette every 3 days  Vaping Use  . Vaping Use: Never used  Substance and Sexual Activity  . Alcohol use: Not Currently    Comment: occ  . Drug use: Not Currently    Types: Cocaine, Marijuana  . Sexual activity: Not Currently    Birth control/protection: Surgical  Other Topics Concern  . Not on file  Social History Narrative   Part-time at [employer], lives in [place], [place]   3 children    Married   Social Determinants of Health   Financial Resource Strain:   . Difficulty of Paying Living Expenses:   Food Insecurity:   . Worried About Paediatric nurse in the Last Year:   . Arboriculturist in the Last Year:   Transportation Needs:   . Film/video editor (Medical):   Marland Kitchen Lack of Transportation (Non-Medical):   Physical Activity:   . Days of Exercise per Week:   . Minutes of Exercise per Session:   Stress:   . Feeling of Stress :   Social Connections:   . Frequency of Communication with Friends and Family:   . Frequency of Social Gatherings with Friends and Family:   . Attends Religious Services:   . Active Member of Clubs or Organizations:   . Attends Archivist Meetings:   Marland Kitchen Marital Status:    Past Surgical History:  Procedure Laterality Date  . ABDOMINAL HYSTERECTOMY  09/10/2011   Procedure: HYSTERECTOMY ABDOMINAL;  Surgeon: Jonnie Kind, MD;  Location: [facility];  Service: Gynecology;  Laterality: N/A;  Abdominal hysterectomy  .  CESAREAN SECTION  [id number], and 1994  . CHOLECYSTECTOMY  1995  . IR CT HEAD LTD  12/27/2019  . IR PERCUTANEOUS ART THROMBECTOMY/INFUSION INTRACRANIAL INC DIAG ANGIO  12/27/2019  . IR US GUIDE VASC ACCESS RIGHT  12/27/2019  . RADIOLOGY WITH ANESTHESIA N/A 12/27/2019   Procedure: IR WITH ANESTHESIA;  Surgeon: Luanne Bras, MD;  Location: [facility];  Service: Radiology;  Laterality: N/A;  . SCAR REVISION  09/10/2011   Procedure: SCAR REVISION;  Surgeon: Jonnie Kind, MD;  Location: [facility];  Service: Gynecology;  Laterality: N/A;  Wide Excision of old Cicatrix  . TUBAL LIGATION  1994   Past Medical History:  Diagnosis Date  . Allergy   . Anemia   . Arthritis   . CHF (congestive heart failure) ([facility])    a. EF at 30-35% by echo in 08/2018 b. EF at 35% by repeat echo in 05/2019  . Chronic abdominal pain   . Cocaine abuse ([facility])   . COPD (chronic obstructive pulmonary disease) ([facility])   . Essential hypertension, benign   . Fatty liver   . GERD (gastroesophageal reflux disease)   . Gout 2016  . Normal coronary arteries    3/10 - following abnormal Myoview  . Ovarian cyst   . Type  2 diabetes mellitus ([facility])    BP 110/81   Pulse 100   Temp 97.7 F (36.5 C)   Ht 5\' 6"  (1.676 m)   Wt 202 lb 12.8 oz (92 kg)   LMP 08/21/2011   SpO2 96%   BMI 32.73 kg/m   Opioid Risk Score:   Fall Risk Score:  `1  Depression screen PHQ 2/9  Depression screen [facility] 2/9 02/14/2020 09/30/2019 09/01/2018  Decreased Interest 2 0 0  Down, Depressed, Hopeless 2 0 0  PHQ - 2 Score 4 0 0  Altered sleeping 1 - -  Tired, decreased energy 1 - -  Change in appetite 1 - -  Feeling bad or failure about yourself  1 - -  Trouble concentrating 1 - -  Moving slowly or fidgety/restless 1 - -  Suicidal thoughts 0 - -  PHQ-9 Score 10 - -  Some recent data might be hidden   Review of Systems  HENT: Positive for drooling.        Right side.  Eyes: Positive for visual disturbance.       Blurry - left eye  Respiratory: Negative.   Cardiovascular: Negative.   Gastrointestinal: Negative.   Endocrine: Negative.   Genitourinary: Negative.   Musculoskeletal: Positive for gait problem.  Skin: Negative.   Allergic/Immunologic: Negative.   Neurological: Positive for weakness.  Hematological: Negative.   Psychiatric/Behavioral: Negative.   All other systems reviewed and are negative.      Objective:   Physical Exam Vitals and nursing note reviewed.  Constitutional:      Appearance: Normal appearance.  Cardiovascular:     Rate and Rhythm: Normal rate and regular rhythm.     Pulses: Normal pulses.     Heart sounds: Normal heart sounds.  Pulmonary:     Effort: Pulmonary effort is normal.     Breath sounds: Normal breath sounds.  Musculoskeletal:     Cervical back: Normal range of motion and neck supple.  Skin:    Comments: Skin irritation on buttock, rectum and gluteal folds  Neurological:     Mental Status: She is alert.           Assessment & Plan:  1. Left  Middle Cerebral Artery Stroke: Right Hemiparesis: Continue Home health Therapy with [facility]. She has a scheduled  appointment with Neurology. Continue current medication regimen.  2. Essential Hypertension: Continue current medication regimen. Continue to monitor.  3.Type 2 DM with CKD with long-term insulin use: Continue current medication regimen. She has a scheduled appointment with PCP. Continue to Monitor. Robin [name] siter reports blood sugar is being checked by her daughter. Continue to monitor.  4.Diabetic Peripheral Neuropathy: Continue current Medication regimen. Continue to Monitor.  5. Polysubstance abuse.: [name] ( Sister) denies any substance abuse since her discharged. Continue to monitor.   30 minutes of face to face patient care time was spent during this visit. All questions were encouraged and answered  F/U in 4- 6 weeks with Dr Letta Pate .

## 2020-02-16 ENCOUNTER — Other Ambulatory Visit: Payer: Self-pay

## 2020-02-16 ENCOUNTER — Encounter: Payer: Self-pay | Admitting: Physician Assistant

## 2020-02-16 ENCOUNTER — Ambulatory Visit (INDEPENDENT_AMBULATORY_CARE_PROVIDER_SITE_OTHER): Payer: 59 | Admitting: Physician Assistant

## 2020-02-16 VITALS — BP 110/62 | HR 82 | Temp 98.4°F | Ht 66.0 in | Wt 206.2 lb

## 2020-02-16 DIAGNOSIS — R21 Rash and other nonspecific skin eruption: Secondary | ICD-10-CM

## 2020-02-16 DIAGNOSIS — E1143 Type 2 diabetes mellitus with diabetic autonomic (poly)neuropathy: Secondary | ICD-10-CM

## 2020-02-16 DIAGNOSIS — I4891 Unspecified atrial fibrillation: Secondary | ICD-10-CM

## 2020-02-16 DIAGNOSIS — I63512 Cerebral infarction due to unspecified occlusion or stenosis of left middle cerebral artery: Secondary | ICD-10-CM

## 2020-02-16 DIAGNOSIS — H579 Unspecified disorder of eye and adnexa: Secondary | ICD-10-CM

## 2020-02-16 DIAGNOSIS — F329 Major depressive disorder, single episode, unspecified: Secondary | ICD-10-CM

## 2020-02-16 DIAGNOSIS — I5021 Acute systolic (congestive) heart failure: Secondary | ICD-10-CM

## 2020-02-16 DIAGNOSIS — Z794 Long term (current) use of insulin: Secondary | ICD-10-CM

## 2020-02-16 MED ORDER — NYSTATIN 100000 UNIT/GM EX OINT: TOPICAL_OINTMENT | CUTANEOUS | 0 refills | Status: DC

## 2020-02-16 MED ORDER — GABAPENTIN 100 MG PO CAPS: 100.0000 mg | ORAL_CAPSULE | Freq: Three times a day (TID) | ORAL | 3 refills | Status: DC

## 2020-02-16 NOTE — Patient Instructions (Addendum)
It was great to see you!  Disregard the combination blood pressure medication (hydralazine + imdur). You are taking these separately already.  Change the liquid gabapentin to capsules.  I will put in referral for your rehab therapists.  Use the new ointment on your buttocks. Let me know if th rash worsens.  Let's make sure all providers are on the same page and follow-up with each after your stroke.  Cardiology (to discuss heart failure and atrial fibrillation) Please make an appointment with Bernerd Pho, [place] at [facility] [address] [place], [place] [address] [phone]  Endocrinology (to discuss diabetes) Please make an appointment with Dr. Jeanella Flattery [facility] [address] [place], [place] [address] [phone]  Follow-up with me in 3-6 months.  Take care,  Inda Coke PA-C
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
## 2020-02-28 ENCOUNTER — Telehealth: Payer: Self-pay

## 2020-02-28 NOTE — Telephone Encounter (Signed)
Patient Sister called for refills.   [name] advised to call PCP ASAP  for all maintenance  medication refills.

## 2020-02-29 ENCOUNTER — Telehealth: Payer: Self-pay | Admitting: Physician Assistant

## 2020-02-29 DIAGNOSIS — I63512 Cerebral infarction due to unspecified occlusion or stenosis of left middle cerebral artery: Secondary | ICD-10-CM

## 2020-02-29 DIAGNOSIS — I10 Essential (primary) hypertension: Secondary | ICD-10-CM

## 2020-02-29 DIAGNOSIS — M546 Pain in thoracic spine: Secondary | ICD-10-CM

## 2020-02-29 DIAGNOSIS — R4701 Aphasia: Secondary | ICD-10-CM

## 2020-02-29 DIAGNOSIS — G936 Cerebral edema: Secondary | ICD-10-CM

## 2020-02-29 DIAGNOSIS — F329 Major depressive disorder, single episode, unspecified: Secondary | ICD-10-CM

## 2020-02-29 DIAGNOSIS — I5021 Acute systolic (congestive) heart failure: Secondary | ICD-10-CM

## 2020-02-29 DIAGNOSIS — E1143 Type 2 diabetes mellitus with diabetic autonomic (poly)neuropathy: Secondary | ICD-10-CM

## 2020-02-29 DIAGNOSIS — E876 Hypokalemia: Secondary | ICD-10-CM

## 2020-02-29 MED ORDER — TORSEMIDE 10 MG PO TABS: 10.0000 mg | ORAL_TABLET | Freq: Every day | ORAL | 0 refills | Status: DC

## 2020-02-29 MED ORDER — CARVEDILOL 25 MG PO TABS: 25.0000 mg | ORAL_TABLET | Freq: Two times a day (BID) | ORAL | 0 refills | Status: DC

## 2020-02-29 MED ORDER — ROSUVASTATIN CALCIUM 20 MG PO TABS: 20.0000 mg | ORAL_TABLET | Freq: Every day | ORAL | 0 refills | Status: DC

## 2020-02-29 MED ORDER — ISOSORBIDE MONONITRATE ER 30 MG PO TB24: 60.0000 mg | ORAL_TABLET | Freq: Every day | ORAL | 11 refills | Status: DC

## 2020-02-29 MED ORDER — DIGOXIN 125 MCG PO TABS: 0.1250 mg | ORAL_TABLET | Freq: Every day | ORAL | 0 refills | Status: DC

## 2020-02-29 MED ORDER — APIXABAN 5 MG PO TABS: 5.0000 mg | ORAL_TABLET | Freq: Two times a day (BID) | ORAL | 1 refills | Status: DC

## 2020-02-29 MED ORDER — INSULIN GLARGINE 100 UNIT/ML SOLOSTAR PEN: 72.0000 [IU] | PEN_INJECTOR | Freq: Every day | SUBCUTANEOUS | 11 refills | Status: DC

## 2020-02-29 MED ORDER — CITALOPRAM HYDROBROMIDE 10 MG PO TABS: 10.0000 mg | ORAL_TABLET | Freq: Every day | ORAL | 0 refills | Status: DC

## 2020-02-29 MED ORDER — METFORMIN HCL 1000 MG PO TABS: 1000.0000 mg | ORAL_TABLET | Freq: Two times a day (BID) | ORAL | 1 refills | Status: DC

## 2020-02-29 MED ORDER — HYDRALAZINE HCL 25 MG PO TABS: 25.0000 mg | ORAL_TABLET | Freq: Three times a day (TID) | ORAL | 11 refills | Status: DC

## 2020-02-29 NOTE — Telephone Encounter (Signed)
  LAST APPOINTMENT DATE: 02/29/2020   NEXT APPOINTMENT DATE:@10 /15/2021  MEDICATION:    citalopram (CELEXA) 10 MG tablet   hydrALAZINE (APRESOLINE) 25 MG tablet   isosorbide mononitrate (IMDUR) 30 MG 24 hr tablet   apixaban (ELIQUIS) 5 MG TABS tablet   torsemide (DEMADEX) 10 MG tablet   rosuvastatin (CRESTOR) 20 MG tablet   metFORMIN (GLUCOPHAGE) 1000 MG tablet   digoxin (LANOXIN) 0.125 MG tablet   carvedilol (COREG) 25 MG tablet  PHARMACY: [place], [place] [address]   COMMENTS: Is completely out of all of these.

## 2020-02-29 NOTE — Telephone Encounter (Signed)
Medications sent to pharmacy

## 2020-02-29 NOTE — Telephone Encounter (Signed)
  LAST APPOINTMENT DATE: 02/16/2020   NEXT APPOINTMENT DATE:@10 /15/2021  MEDICATION: insulin glargine (LANTUS) 100 UNIT/ML Solostar Pen   PHARMACY: [place], [place] [address]  COMMENTS: Patient is completely out as of today.

## 2020-03-02 ENCOUNTER — Telehealth: Payer: Self-pay | Admitting: *Deleted

## 2020-03-02 NOTE — Telephone Encounter (Signed)
Robin [name] ST called to report inability to reach patient this week and family was not able to contact her as well.  She will try again next week.

## 2020-03-09 ENCOUNTER — Other Ambulatory Visit (HOSPITAL_COMMUNITY): Payer: Self-pay | Admitting: Neuroradiology

## 2020-03-09 ENCOUNTER — Ambulatory Visit (HOSPITAL_COMMUNITY)
Admission: RE | Admit: 2020-03-09 | Discharge: 2020-03-09 | Disposition: A | Discharge status: Home / Self Care | Payer: 59 | Source: Ambulatory Visit | Attending: Neuroradiology | Admitting: Neuroradiology

## 2020-03-09 ENCOUNTER — Other Ambulatory Visit: Payer: Self-pay

## 2020-03-09 DIAGNOSIS — I671 Cerebral aneurysm, nonruptured: Secondary | ICD-10-CM

## 2020-03-09 NOTE — H&P (Signed)
Referring Physician(s): de Macedo [place]  Chief Complaint: The patient is seen in follow up today s/p mechanical thrombectomy for left MCA occlusion on 12/27/2019 with Dr. Corrie Mckusick with incidental right ICA aneurysm.  History of present illness: 54 y.o. female with a history of DM, HTN, atrial fibrillation on Eliquis, anemia, CHF, COPD and polysubstance abuse, who presented withr acute left MCA syndrome on 12/27/2019. She was found to have a left M1/MCA occlusion for which she underwent mechanical thrombectomy with complete restoration of flow.She has made a significant recovery , still with residual right sided weakness and expressive aphasia. She is able to ambulate without help for short distances inside the house.  CTA performed during admission showed an incidental  7 mm right ICA aneurysm. She comes here today accompanied by her daughter to discuss CTA findings.    Past Medical History:  Diagnosis Date  . Allergy   . Anemia   . Arthritis   . CHF (congestive heart failure) ([place])    a. EF at 30-35% by echo in 08/2018 b. EF at 35% by repeat echo in 05/2019  . Chronic abdominal pain   . Cocaine abuse ([place])   . COPD (chronic obstructive pulmonary disease) ([place])   . Essential hypertension, benign   . Fatty liver   . GERD (gastroesophageal reflux disease)   . Gout 2016  . Normal coronary arteries    3/10 - following abnormal Myoview  . Ovarian cyst   . Type 2 diabetes mellitus ([place])     Past Surgical History:  Procedure Laterality Date  . ABDOMINAL HYSTERECTOMY  09/10/2011   Procedure: HYSTERECTOMY ABDOMINAL;  Surgeon: Jonnie Kind, MD;  Location: [facility];  Service: Gynecology;  Laterality: N/A;  Abdominal hysterectomy  . CESAREAN SECTION  [id number], and 1994  . CHOLECYSTECTOMY  1995  . IR CT HEAD LTD  12/27/2019  . IR PERCUTANEOUS ART THROMBECTOMY/INFUSION INTRACRANIAL INC DIAG ANGIO  12/27/2019  . IR US GUIDE VASC ACCESS RIGHT  12/27/2019  . RADIOLOGY WITH  ANESTHESIA N/A 12/27/2019   Procedure: IR WITH ANESTHESIA;  Surgeon: Luanne Bras, MD;  Location: [facility];  Service: Radiology;  Laterality: N/A;  . SCAR REVISION  09/10/2011   Procedure: SCAR REVISION;  Surgeon: Jonnie Kind, MD;  Location: [facility];  Service: Gynecology;  Laterality: N/A;  Wide Excision of old Cicatrix  . TUBAL LIGATION  1994    Allergies: Bee venom, Losartan, Naproxen, Penicillins, and Lisinopril  Medications: Prior to Admission medications   Medication Sig Start Date End Date Taking? Authorizing Provider  acetaminophen (TYLENOL) 325 MG tablet Take 2 tablets (650 mg total) by mouth every 4 (four) hours as needed for mild pain (or temp > 37.5 C (99.5 F)). 01/05/20   Donzetta Starch, NP  apixaban (ELIQUIS) 5 MG TABS tablet Take 1 tablet (5 mg total) by mouth 2 (two) times daily. 02/29/20   Inda Coke, PA  BD PEN NEEDLE NANO U/F 32G X 4 MM MISC Inject into the skin as directed. 02/01/20   [provider]  carvedilol (COREG) 25 MG tablet Take 1 tablet (25 mg total) by mouth 2 (two) times daily with a meal. 02/29/20   Inda Coke, PA  citalopram (CELEXA) 10 MG tablet Take 1 tablet (10 mg total) by mouth daily. 02/29/20   Inda Coke, PA  digoxin (LANOXIN) 0.125 MG tablet Take 1 tablet (0.125 mg total) by mouth daily. 02/29/20   Inda Coke, PA  gabapentin (NEURONTIN) 100 MG capsule Take 1  capsule (100 mg total) by mouth 3 (three) times daily. 02/16/20   Inda Coke, PA  hydrALAZINE (APRESOLINE) 25 MG tablet Take 1 tablet (25 mg total) by mouth 3 (three) times daily. 02/29/20 02/28/21  Inda Coke, PA  insulin glargine (LANTUS) 100 UNIT/ML Solostar Pen Inject 72 Units into the skin at bedtime. 02/29/20   Inda Coke, PA  isosorbide mononitrate (IMDUR) 30 MG 24 hr tablet Take 2 tablets (60 mg total) by mouth daily. 02/29/20 02/28/21  Inda Coke, PA  metFORMIN (GLUCOPHAGE) 1000 MG tablet Take 1 tablet (1,000 mg total) by mouth 2 (two) times  daily with a meal. 02/29/20   Inda Coke, PA  Multiple Vitamin (MULTIVITAMIN WITH MINERALS) TABS tablet Take 1 tablet by mouth daily. 02/01/20   Angiulli, Lavon Paganini, PA-C  nitroGLYCERIN (NITROSTAT) 0.4 MG SL tablet Place 1 tablet (0.4 mg total) under the tongue every 5 (five) minutes as needed for chest pain. 02/01/20   Angiulli, Lavon Paganini, PA-C  nystatin ointment (MYCOSTATIN) Apply to affected area 1-2 times daily 02/16/20   Inda Coke, PA  rosuvastatin (CRESTOR) 20 MG tablet Take 1 tablet (20 mg total) by mouth daily. 02/29/20   Inda Coke, PA  tiZANidine (ZANAFLEX) 2 MG tablet Take 1 tablet (2 mg total) by mouth 2 (two) times daily as needed (muscle spasm). 02/01/20   Angiulli, Lavon Paganini, PA-C  torsemide (DEMADEX) 10 MG tablet Take 1 tablet (10 mg total) by mouth daily. 02/29/20   Inda Coke, PA     Family History  Problem Relation Age of Onset  . Cirrhosis Mother   . Early death Mother   . Alcohol abuse Mother   . Diabetes type II Father   . Alcohol abuse Father   . Diabetes type II Sister   . Early death Brother   . Alcohol abuse Son   . Anesthesia problems Neg Hx   . Hypotension Neg Hx   . Malignant hyperthermia Neg Hx   . Pseudochol deficiency Neg Hx   . Colon cancer Neg Hx   . Colon polyps Neg Hx   . Esophageal cancer Neg Hx   . Rectal cancer Neg Hx   . Stomach cancer Neg Hx     Social History   Socioeconomic History  . Marital status: Single    Spouse name: Not on file  . Number of children: Not on file  . Years of education: Not on file  . Highest education level: Not on file  Occupational History  . Not on file  Tobacco Use  . Smoking status: Former Smoker    Packs/day: 0.25    Years: 29.00    Pack years: 7.25    Types: Cigarettes  . Smokeless tobacco: Never Used  . Tobacco comment: 1 cigarette every 3 days  Vaping Use  . Vaping Use: Never used  Substance and Sexual Activity  . Alcohol use: Not Currently    Comment: occ  . Drug use: Not  Currently    Types: Cocaine, Marijuana  . Sexual activity: Not Currently    Birth control/protection: Surgical  Other Topics Concern  . Not on file  Social History Narrative   Part-time at [employer], lives in [place], [place]   3 children    Married   Social Determinants of Health   Financial Resource Strain:   . Difficulty of Paying Living Expenses:   Food Insecurity:   . Worried About Charity fundraiser in the Last Year:   . [employer] of  Food in the Last Year:   Transportation Needs:   . Film/video editor (Medical):   Marland Kitchen Lack of Transportation (Non-Medical):   Physical Activity:   . Days of Exercise per Week:   . Minutes of Exercise per Session:   Stress:   . Feeling of Stress :   Social Connections:   . Frequency of Communication with Friends and Family:   . Frequency of Social Gatherings with Friends and Family:   . Attends Religious Services:   . Active Member of Clubs or Organizations:   . Attends Archivist Meetings:   Marland Kitchen Marital Status:      Vital Signs: SpO2: 99% BP: 107/72 left arm; 101/75 mmHg right arm. Pulse: 84 ppm  Physical Exam Constitutional:      Appearance: Normal appearance.  Neurological:     Mental Status: She is alert.     Cranial Nerves: Dysarthria present.     Motor: Weakness and pronator drift present.     Comments: Right arm weakness.     Imaging:  Review CTA 12/27/2019: Right ICA paraclinoid aneurysm Review angiogram/thrombectomy: 12/27/2019: likely additional 4 mm left MCA bifurcation aneurysm and left ICA/posterior communicating artery aneurysm vs infundibulum.  Labs:  CBC: Recent Labs    01/28/20 1215 01/29/20 1302 01/30/20 0519 02/02/20 0608  WBC 9.8 9.8 8.8 8.9  HGB 13.3 13.6 13.2 12.0  HCT 39.6 41.2 40.8 36.4  PLT 324 265 242 260    COAGS: Recent Labs    12/26/19 2255 12/26/19 2255 01/01/20 0810 01/01/20 1648 01/02/20 0442 01/02/20 1331  INR 1.2  --   --   --   --   --   APTT 23*   < >  56* 52* 56* 56*   < > = values in this interval not displayed.    BMP: Recent Labs    01/20/20 1051 01/26/20 0549 01/29/20 1302 01/30/20 0519  NA 135 137 135 136  K 4.8 4.2 4.0 4.0  CL 103 102 102 104  CO2 23 24 19* 20*  GLUCOSE 279* 185* 289* 159*  BUN 13 18 22* 22*  CALCIUM 9.2 9.5 9.6 9.4  CREATININE 0.93 1.08* 1.03* 1.07*  GFRNONAA >60 59* >60 59*  GFRAA >60 >60 >60 >60    LIVER FUNCTION TESTS: Recent Labs    05/10/19 0848 12/26/19 2254 01/04/20 0648 01/06/20 0549  BILITOT 0.8 0.5 0.8 0.7  AST 18 36 46* 32  ALT 18 26 33 38  ALKPHOS 117 87 65 69  PROT 6.9 7.0 6.4* 6.7  ALBUMIN 3.6 3.7 2.9* 2.9*    Assessment: Mrs. Fragoso has made a significant recovery from her left MCA territory stroke with residual right sided weakness. We discussed the incidental finding of a right ICA paraclinoid aneurysm as well as the concern for possible left ICA and left MCA bifurcation aneurysm. She does have risk factors including hypertension, polysubstance use, possible multiple aneurysms and anticoagulation use. We discussed performing a diagnostic cerebral angiogram to have a better understanding of number, size and shape of the aneurysms as well as its relation to the parent artery. She understands and agree with the plan. All questions were answered to her satisfaction. Based on angiography findings, we will discuss management options.   Signed: Pedro Earls, MD 03/09/2020, 3:21 PM    I spent a total of 25 Minutes in face to face in clinical consultation, greater than 50% of which was counseling/coordinating care for brain aneurysms.

## 2020-03-13 ENCOUNTER — Other Ambulatory Visit: Payer: Self-pay | Admitting: General Practice

## 2020-03-13 ENCOUNTER — Telehealth: Payer: Self-pay | Admitting: General Practice

## 2020-03-13 DIAGNOSIS — Z7901 Long term (current) use of anticoagulants: Secondary | ICD-10-CM

## 2020-03-13 MED ORDER — ENOXAPARIN SODIUM 150 MG/ML ~~LOC~~ SOLN: 150.0000 mg | SUBCUTANEOUS | 0 refills | Status: DC

## 2020-03-13 NOTE — Progress Notes (Unsigned)
[facility]

## 2020-03-13 NOTE — Telephone Encounter (Signed)
Villa Herb, RN spoke with [name] Nonie Hoyer, patient's sister.  Instructions for Eliquis and Lovenox below:  8/9 - Last dose of Eliquis until after procedure. 8/10 - Lovenox in the AM 8/11 - Lovenox in the AM 8/12 - Procedure 8/13 - Re-start Eliquis  Patient's sister verbalized instructions.

## 2020-03-15 ENCOUNTER — Other Ambulatory Visit: Payer: Self-pay | Admitting: Radiology

## 2020-03-15 NOTE — H&P (Signed)
Chief Complaint: Patient was seen in consultation today for a diagnostic cerebral angiogram.    Referring Physician(s): de Sindy Messing, Erven Colla  Supervising Physician: Pedro Earls  Patient Status: [facility] - Out-pt  History of Present Illness: Robin [name] is a 54 y.o. female with a medical history that includes HTN, DM, atrial fibrillation (on Eliquis) and polysubstance abuse.   She was brought to the ED 12/26/19 for altered mental status and right-sided weakness. CT head showed possible stroke. Patient was taken to IR for a cerebral angiogram and mechanical thrombectomy of a left ICA terminus occlusion. Complete restoration of flow was achieved.   Follow up MRI of the brain on 12/28/19: IMPRESSION: 1. Evolving left MCA territory infarct with hemorrhagic transformation. Mass effect with 6 mm rightward midline shift. 2. Normal flow voids noting a 3 mm left MCA bifurcation aneurysm and a 7 mm paraclinoid right ICA aneurysm   The patient recovered enough to be transferred to inpatient rehab on 01/05/20 and was discharged home 02/02/20. Robin [name] and her daughter met with Dr. Karenann Cai as an out-patient on 03/09/20 to discuss the incidental 7 mm right ICA aneurysm that was discovered during her hospitalization. There is also concern for a possible left ICA and left MCA bifurcation aneurysms. Dr. Katherina Right Rodgrigues discussed performing a diagnostic cerebral angiogram for further evaluation of the aneurysms. Robin [name] and her daughter were in agreement to proceed.  .  Past Medical History:  Diagnosis Date   Allergy    Anemia    Arthritis    CHF (congestive heart failure) ([place])    a. EF at 30-35% by echo in 08/2018 b. EF at 35% by repeat echo in 05/2019   Chronic abdominal pain    Cocaine abuse ([place])    COPD (chronic obstructive pulmonary disease) ([place])    Essential hypertension, benign    Fatty liver    GERD (gastroesophageal reflux disease)     Gout 2016   Normal coronary arteries    3/10 - following abnormal Myoview   Ovarian cyst    Type 2 diabetes mellitus ([place])     Past Surgical History:  Procedure Laterality Date   ABDOMINAL HYSTERECTOMY  09/10/2011   Procedure: HYSTERECTOMY ABDOMINAL;  Surgeon: Jonnie Kind, MD;  Location: [facility];  Service: Gynecology;  Laterality: N/A;  Abdominal hysterectomy   CESAREAN SECTION  [id number], and [place]   IR CT HEAD LTD  12/27/2019   IR PERCUTANEOUS ART THROMBECTOMY/INFUSION INTRACRANIAL INC DIAG ANGIO  12/27/2019   IR US GUIDE VASC ACCESS RIGHT  12/27/2019   RADIOLOGY WITH ANESTHESIA N/A 12/27/2019   Procedure: IR WITH ANESTHESIA;  Surgeon: Luanne Bras, MD;  Location: [facility];  Service: Radiology;  Laterality: N/A;   SCAR REVISION  09/10/2011   Procedure: SCAR REVISION;  Surgeon: Jonnie Kind, MD;  Location: [facility];  Service: Gynecology;  Laterality: N/A;  Wide Excision of old Cicatrix   TUBAL LIGATION  1994    Allergies: Bee venom, Losartan, Naproxen, Penicillins, and Lisinopril  Medications: Prior to Admission medications   Medication Sig Start Date End Date Taking? Authorizing Provider  acetaminophen (TYLENOL) 325 MG tablet Take 2 tablets (650 mg total) by mouth every 4 (four) hours as needed for mild pain (or temp > 37.5 C (99.5 F)). 01/05/20   Donzetta Starch, NP  apixaban (ELIQUIS) 5 MG TABS tablet Take 1 tablet (5 mg total) by mouth 2 (two) times daily. 02/29/20  Inda Coke, PA  BD PEN NEEDLE NANO U/F 32G X 4 MM MISC Inject into the skin as directed. 02/01/20   [provider]  carvedilol (COREG) 25 MG tablet Take 1 tablet (25 mg total) by mouth 2 (two) times daily with a meal. 02/29/20   Inda Coke, PA  citalopram (CELEXA) 10 MG tablet Take 1 tablet (10 mg total) by mouth daily. 02/29/20   Inda Coke, PA  digoxin (LANOXIN) 0.125 MG tablet Take 1 tablet (0.125 mg total) by mouth daily. 02/29/20   Inda Coke, PA   enoxaparin (LOVENOX) 150 MG/ML injection Inject 1 mL (150 mg total) into the skin daily for 2 days. 03/14/20 03/16/20  Inda Coke, PA  gabapentin (NEURONTIN) 100 MG capsule Take 1 capsule (100 mg total) by mouth 3 (three) times daily. 02/16/20   Inda Coke, PA  hydrALAZINE (APRESOLINE) 25 MG tablet Take 1 tablet (25 mg total) by mouth 3 (three) times daily. 02/29/20 02/28/21  Inda Coke, PA  insulin glargine (LANTUS) 100 UNIT/ML Solostar Pen Inject 72 Units into the skin at bedtime. 02/29/20   Inda Coke, PA  isosorbide mononitrate (IMDUR) 30 MG 24 hr tablet Take 2 tablets (60 mg total) by mouth daily. 02/29/20 02/28/21  Inda Coke, PA  metFORMIN (GLUCOPHAGE) 1000 MG tablet Take 1 tablet (1,000 mg total) by mouth 2 (two) times daily with a meal. 02/29/20   Inda Coke, PA  Multiple Vitamin (MULTIVITAMIN WITH MINERALS) TABS tablet Take 1 tablet by mouth daily. 02/01/20   Angiulli, Lavon Paganini, PA-C  nitroGLYCERIN (NITROSTAT) 0.4 MG SL tablet Place 1 tablet (0.4 mg total) under the tongue every 5 (five) minutes as needed for chest pain. 02/01/20   Angiulli, Lavon Paganini, PA-C  nystatin ointment (MYCOSTATIN) Apply to affected area 1-2 times daily 02/16/20   Inda Coke, PA  rosuvastatin (CRESTOR) 20 MG tablet Take 1 tablet (20 mg total) by mouth daily. 02/29/20   Inda Coke, PA  tiZANidine (ZANAFLEX) 2 MG tablet Take 1 tablet (2 mg total) by mouth 2 (two) times daily as needed (muscle spasm). 02/01/20   Angiulli, Lavon Paganini, PA-C  torsemide (DEMADEX) 10 MG tablet Take 1 tablet (10 mg total) by mouth daily. 02/29/20   Inda Coke, PA     Family History  Problem Relation Age of Onset   Cirrhosis Mother    Early death Mother    Alcohol abuse Mother    Diabetes type II Father    Alcohol abuse Father    Diabetes type II Sister    Early death Brother    Alcohol abuse Son    Anesthesia problems Neg Hx    Hypotension Neg Hx    Malignant hyperthermia Neg Hx     Pseudochol deficiency Neg Hx    Colon cancer Neg Hx    Colon polyps Neg Hx    Esophageal cancer Neg Hx    Rectal cancer Neg Hx    Stomach cancer Neg Hx     Social History   Socioeconomic History   Marital status: Single    Spouse name: Not on file   Number of children: Not on file   Years of education: Not on file   Highest education level: Not on file  Occupational History   Not on file  Tobacco Use   Smoking status: Former Smoker    Packs/day: 0.25    Years: 29.00    Pack years: 7.25    Types: Cigarettes   Smokeless tobacco: Never Used   Tobacco  comment: 1 cigarette every 3 days  Vaping Use   Vaping Use: Never used  Substance and Sexual Activity   Alcohol use: Not Currently    Comment: occ   Drug use: Not Currently    Types: Cocaine, Marijuana   Sexual activity: Not Currently    Birth control/protection: Surgical  Other Topics Concern   Not on file  Social History Narrative   Part-time at [employer], lives in [place], [place]   3 children    Married   Social Determinants of Health   Financial Resource Strain:    Difficulty of Paying Living Expenses:   Food Insecurity:    Worried About Charity fundraiser in the Last Year:    Arboriculturist in the Last Year:   Transportation Needs:    Film/video editor (Medical):    Lack of Transportation (Non-Medical):   Physical Activity:    Days of Exercise per Week:    Minutes of Exercise per Session:   Stress:    Feeling of Stress :   Social Connections:    Frequency of Communication with Friends and Family:    Frequency of Social Gatherings with Friends and Family:    Attends Religious Services:    Active Member of Clubs or Organizations:    Attends Archivist Meetings:    Marital Status:     Review of Systems: A 12 point ROS discussed and pertinent positives are indicated in the HPI above.  All other systems are negative.  Review of Systems  Unable to perform  ROS: Patient nonverbal    Vital Signs: BP 91/65    Pulse 92    Temp 98.3 F (36.8 C) (Oral)    Resp 16    LMP 08/21/2011    SpO2 99%   Physical Exam Constitutional:      General: She is not in acute distress.    Comments: Intermittently opens eyes. Patient is non-verbal.   HENT:     Mouth/Throat:     Mouth: Mucous membranes are moist.     Pharynx: Oropharynx is clear.  Cardiovascular:     Rate and Rhythm: Normal rate and regular rhythm.  Pulmonary:     Effort: Pulmonary effort is normal.     Breath sounds: Normal breath sounds.  Abdominal:     General: Bowel sounds are normal.     Palpations: Abdomen is soft.  Skin:    General: Skin is warm and dry.  Neurological:     Comments: Full range of motion and 5/5 strength on the left side. 3/5 strength on the right side of the body. Patient able to follow some commands but not all. Unable to smile. Unable to fully open mouth. Poor tongue coordination.     Imaging: No results found.  Labs:  CBC: Recent Labs    01/29/20 1302 01/30/20 0519 02/02/20 0608 03/16/20 0741  WBC 9.8 8.8 8.9 8.7  HGB 13.6 13.2 12.0 12.9  HCT 41.2 40.8 36.4 38.2  PLT 265 242 260 261    COAGS: Recent Labs    12/26/19 2255 12/26/19 2255 01/01/20 0810 01/01/20 1648 01/02/20 0442 01/02/20 1331  INR 1.2  --   --   --   --   --   APTT 23*   < > 56* 52* 56* 56*   < > = values in this interval not displayed.    BMP: Recent Labs    01/20/20 1051 01/26/20 0549 01/29/20 1302 01/30/20 0519  NA 135 137 135 136  K 4.8 4.2 4.0 4.0  CL 103 102 102 104  CO2 23 24 19* 20*  GLUCOSE 279* 185* 289* 159*  BUN 13 18 22* 22*  CALCIUM 9.2 9.5 9.6 9.4  CREATININE 0.93 1.08* 1.03* 1.07*  GFRNONAA >60 59* >60 59*  GFRAA >60 >60 >60 >60    LIVER FUNCTION TESTS: Recent Labs    05/10/19 0848 12/26/19 2254 01/04/20 0648 01/06/20 0549  BILITOT 0.8 0.5 0.8 0.7  AST 18 36 46* 32  ALT 18 26 33 38  ALKPHOS 117 87 65 69  PROT 6.9 7.0 6.4* 6.7   ALBUMIN 3.6 3.7 2.9* 2.9*    TUMOR MARKERS: No results for input(s): AFPTM, CEA, CA199, CHROMGRNA in the last 8760 hours.  Assessment and Plan:  Right ICA paraclinoid aneurysm, possible left ICA and left MCA bifurcation aneurysms: Robin [name], 54 year old female, presents today to the [facility]ventional Radiology department for a diagnostic cerebral angiogram for further work-up and management of her aneurysms.    Risks and benefits of a cerebral angiogram were discussed with the patient and her daughter including, but not limited to bleeding, infection, vascular injury or contrast induced renal failure.  This interventional procedure involves the use of X-rays and because of the nature of the planned procedure, it is possible that we will have prolonged use of X-ray fluoroscopy.  Potential radiation risks to you include (but are not limited to) the following: - A slightly elevated risk for cancer  several years later in life. This risk is typically less than 0.5% percent. This risk is low in comparison to the normal incidence of human cancer, which is 33% for women and 50% for men according to the [place]. - Radiation induced injury can include skin redness, resembling a rash, tissue breakdown / ulcers and hair loss (which can be temporary or permanent).   The likelihood of either of these occurring depends on the difficulty of the procedure and whether you are sensitive to radiation due to previous procedures, disease, or genetic conditions.   IF your procedure requires a prolonged use of radiation, you will be notified and given written instructions for further action.  It is your responsibility to monitor the irradiated area for the 2 weeks following the procedure and to notify your physician if you are concerned that you have suffered a radiation induced injury.    All of the patient's daughter's questions were answered, patient's daughter is agreeable  to proceed.  The patient has been NPO. Labs and vitals have been reviewed.   Telephone consent obtained from patient's daughter.   Thank you for this interesting consult.  I greatly enjoyed meeting Robin [name] and look forward to participating in their care.  A copy of this report was sent to the requesting provider on this date.  Electronically Signed: Soyla Dryer, AGACNP-BC [phone] 03/16/2020, 8:06 AM   I spent a total of  30 Minutes   in face to face in clinical consultation, greater than 50% of which was counseling/coordinating care for a diagnostic cerebral angiogram.

## 2020-03-16 ENCOUNTER — Ambulatory Visit (HOSPITAL_COMMUNITY)
Admission: RE | Admit: 2020-03-16 | Discharge: 2020-03-16 | Disposition: A | Discharge status: Home / Self Care | Payer: 59 | Source: Ambulatory Visit | Attending: Neuroradiology | Admitting: Neuroradiology

## 2020-03-16 ENCOUNTER — Other Ambulatory Visit (HOSPITAL_COMMUNITY): Payer: Self-pay | Admitting: Neuroradiology

## 2020-03-16 ENCOUNTER — Encounter: Payer: Self-pay | Admitting: Neurology

## 2020-03-16 ENCOUNTER — Other Ambulatory Visit: Payer: Self-pay

## 2020-03-16 DIAGNOSIS — F17210 Nicotine dependence, cigarettes, uncomplicated: Secondary | ICD-10-CM | POA: Diagnosis not present

## 2020-03-16 DIAGNOSIS — Z794 Long term (current) use of insulin: Secondary | ICD-10-CM | POA: Diagnosis not present

## 2020-03-16 DIAGNOSIS — M109 Gout, unspecified: Secondary | ICD-10-CM | POA: Diagnosis not present

## 2020-03-16 DIAGNOSIS — I4891 Unspecified atrial fibrillation: Secondary | ICD-10-CM | POA: Diagnosis not present

## 2020-03-16 DIAGNOSIS — Z79899 Other long term (current) drug therapy: Secondary | ICD-10-CM | POA: Diagnosis not present

## 2020-03-16 DIAGNOSIS — D649 Anemia, unspecified: Secondary | ICD-10-CM | POA: Insufficient documentation

## 2020-03-16 DIAGNOSIS — F1910 Other psychoactive substance abuse, uncomplicated: Secondary | ICD-10-CM | POA: Diagnosis not present

## 2020-03-16 DIAGNOSIS — I671 Cerebral aneurysm, nonruptured: Secondary | ICD-10-CM | POA: Insufficient documentation

## 2020-03-16 DIAGNOSIS — J449 Chronic obstructive pulmonary disease, unspecified: Secondary | ICD-10-CM | POA: Insufficient documentation

## 2020-03-16 DIAGNOSIS — K219 Gastro-esophageal reflux disease without esophagitis: Secondary | ICD-10-CM | POA: Diagnosis not present

## 2020-03-16 DIAGNOSIS — Z7901 Long term (current) use of anticoagulants: Secondary | ICD-10-CM | POA: Diagnosis not present

## 2020-03-16 DIAGNOSIS — E119 Type 2 diabetes mellitus without complications: Secondary | ICD-10-CM | POA: Insufficient documentation

## 2020-03-16 DIAGNOSIS — I110 Hypertensive heart disease with heart failure: Secondary | ICD-10-CM | POA: Insufficient documentation

## 2020-03-16 DIAGNOSIS — I509 Heart failure, unspecified: Secondary | ICD-10-CM | POA: Diagnosis not present

## 2020-03-16 HISTORY — PX: IR ANGIO INTRA EXTRACRAN SEL INTERNAL CAROTID BILAT MOD SED: IMG5363

## 2020-03-16 HISTORY — PX: IR ANGIO VERTEBRAL SEL SUBCLAVIAN INNOMINATE UNI L MOD SED: IMG5364

## 2020-03-16 HISTORY — PX: IR 3D INDEPENDENT WKST: IMG2385

## 2020-03-16 HISTORY — PX: IR ANGIO VERTEBRAL SEL VERTEBRAL UNI R MOD SED: IMG5368

## 2020-03-16 HISTORY — PX: IR US GUIDE VASC ACCESS RIGHT: IMG2390

## 2020-03-16 LAB — BASIC METABOLIC PANEL
Anion gap: 13 (ref 5–15)
BUN: 15 mg/dL (ref 6–20)
CO2: 24 mmol/L (ref 22–32)
Calcium: 8.3 mg/dL — ABNORMAL LOW (ref 8.9–10.3)
Chloride: 100 mmol/L (ref 98–111)
Creatinine, Ser: 1.49 mg/dL — ABNORMAL HIGH (ref 0.44–1.00)
GFR calc Af Amer: 46 mL/min — ABNORMAL LOW (ref >60)
GFR calc non Af Amer: 40 mL/min — ABNORMAL LOW (ref >60)
Glucose, Bld: 210 mg/dL — ABNORMAL HIGH (ref 70–99)
Potassium: 3.1 mmol/L — ABNORMAL LOW (ref 3.5–5.1)
Sodium: 137 mmol/L (ref 135–145)

## 2020-03-16 LAB — CBC
HCT: 38.2 % (ref 36.0–46.0)
Hemoglobin: 12.9 g/dL (ref 12.0–15.0)
MCH: 28.2 pg (ref 26.0–34.0)
MCHC: 33.8 g/dL (ref 30.0–36.0)
MCV: 83.6 fL (ref 80.0–100.0)
Platelets: 261 10*3/uL (ref 150–400)
RBC: 4.57 MIL/uL (ref 3.87–5.11)
RDW: 13.1 % (ref 11.5–15.5)
WBC: 8.7 10*3/uL (ref 4.0–10.5)
nRBC: 0 % (ref 0.0–0.2)

## 2020-03-16 LAB — GLUCOSE, CAPILLARY: Glucose-Capillary: 243 mg/dL — ABNORMAL HIGH (ref 70–99)

## 2020-03-16 LAB — PROTIME-INR
INR: 1.2 (ref 0.8–1.2)
Prothrombin Time: 14.8 seconds (ref 11.4–15.2)

## 2020-03-16 IMAGING — XA IR CAROTID INTERNAL HEAD/NECK BILAT  (MS)
10 of 16 series · 11 of 24 positions shown · IV contrast (IODINE)
Comparison: CT/CT angiogram of the head and neck [DATE]

INDICATION: 53 y.o. female with a history of DM, HTN, atrial fibrillation on
Eliquis, anemia, CHF, COPD and polysubstance abuse, who presented
withr acute left MCA syndrome on [DATE]. She was found to have a
left M1/MCA occlusion for which she underwent mechanical
thrombectomy with complete restoration of flow. CTA performed during
admission showed an incidental 7 mm right ICA aneurysm. Angiogram
performed during intervention was concerning for additional small
left MCA bifurcation aneurysm, left ICA/posterior communicating
artery aneurysm versus infundibulum and anterior communicating
artery aneurysm. She calms to our service today for diagnostic
angiogram to evaluate areas of concern for aneurysm.

EXAM:
ULTRASOUND-GUIDED VASCULAR [REDACTED] CEREBRAL ANGIOGRAM
3D ROTATIONAL ANGIOGRAM
TECHNIQUE: Informed written consent was obtained from the patient and her
daughter after a thorough discussion of the procedural risks,
benefits and alternatives. All questions were addressed. Maximal
Sterile Barrier Technique was utilized including caps, mask, sterile
gowns, sterile gloves, sterile drape, hand hygiene and skin
antiseptic. A timeout was performed prior to the initiation of the
procedure.

[Series 1: n roadmap · 1 of 75 frames shown (1 of 3)]
[frame 64/75]
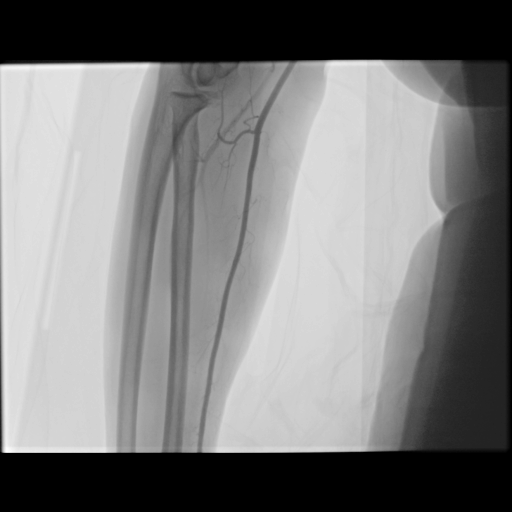

[Series 2: n roadmap · 1 of 31 frames shown (2 of 3)]
[frame 27/31]
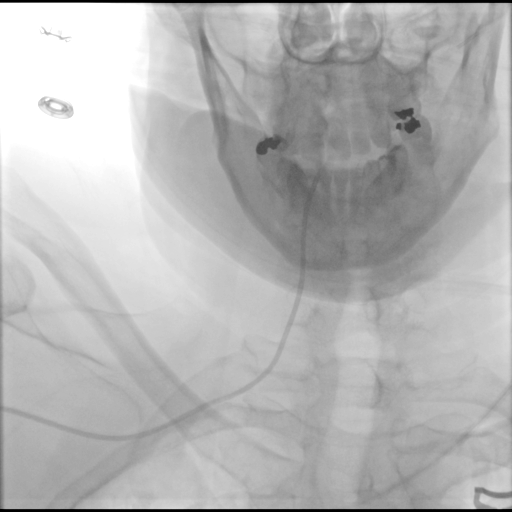

[Series 4: fl neuro n · 1 of 46 frames shown]
[frame 7/46]
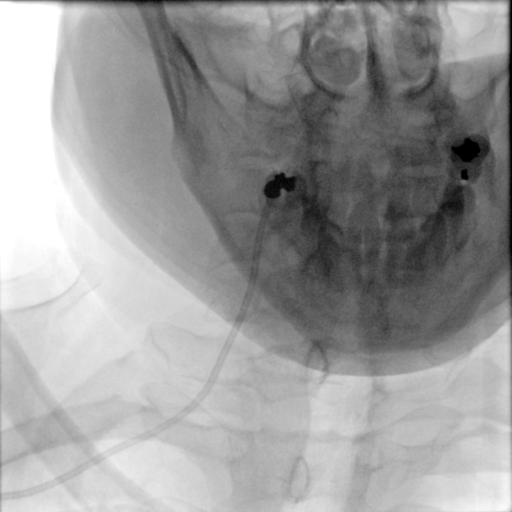

[Series 5: cerebral care 2 · 4 acquisitions, 1 frame shown (1 of 5)]
[im 1/4]
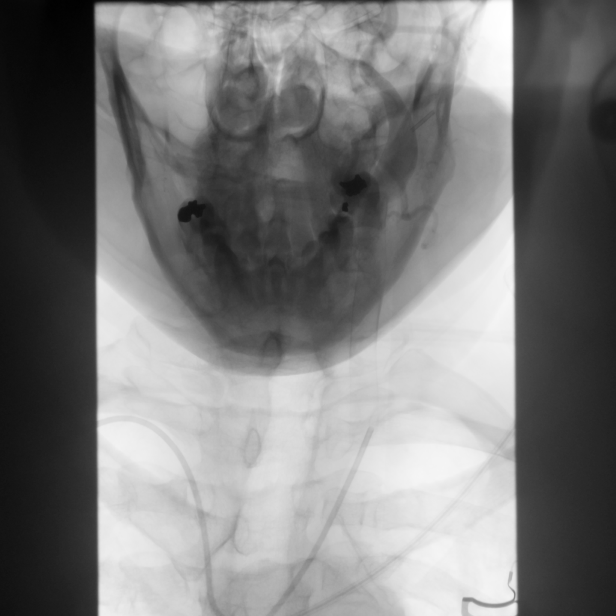

[Series 6: cerebral care 2 · 4 acquisitions, 1 frame shown (2 of 5)]
[im 1/4]
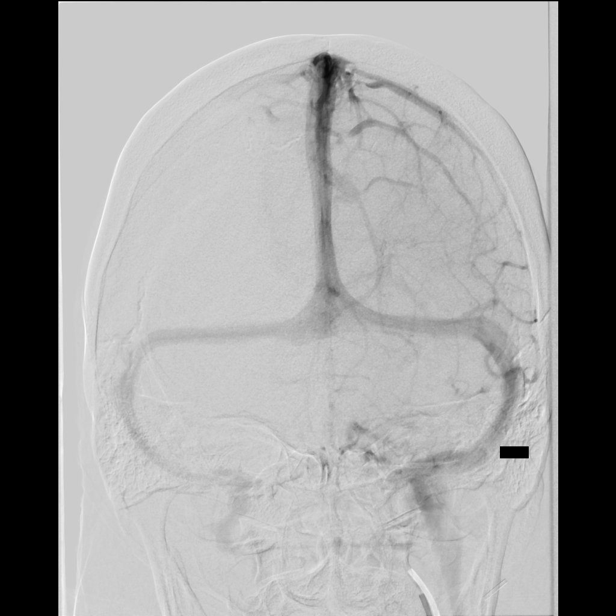

[Series 8: cerebral care 2 · 4 acquisitions, 1 frame shown (3 of 5)]
[im 1/4]
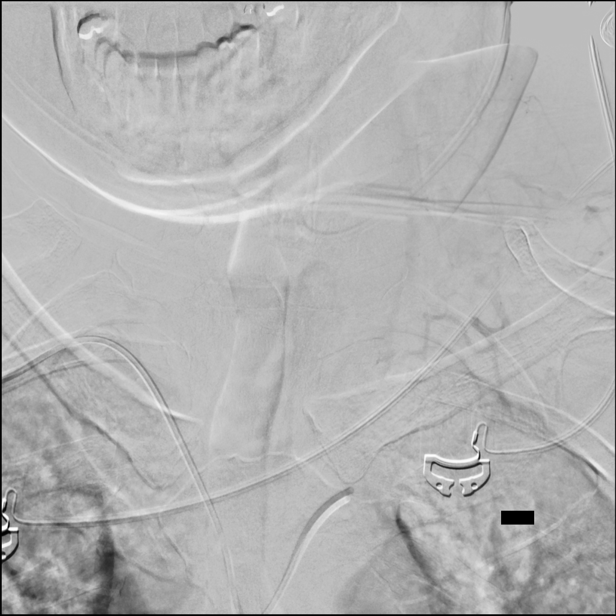

[Series 10: n roadmap · 2 acquisitions, 1 frame shown (3 of 3)]
[im 1/2]
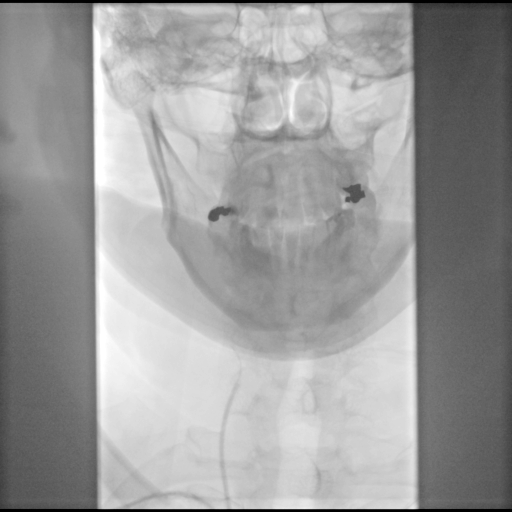

[Series 11: cerebral care 2 · 4 acquisitions, 1 frame shown (4 of 5)]
[im 1/4]
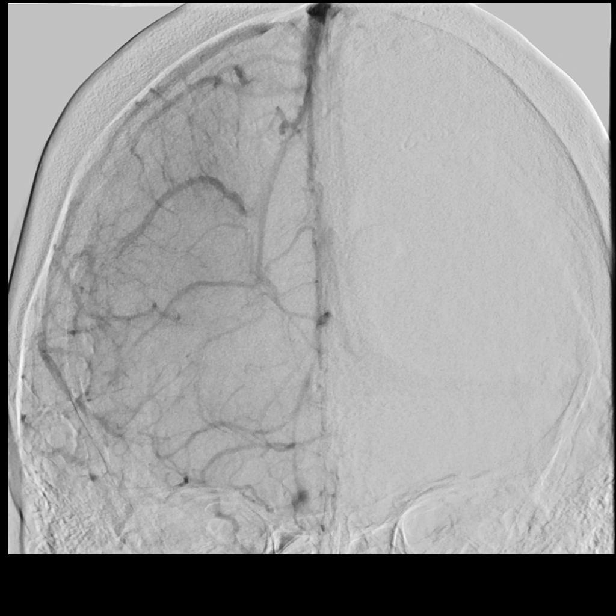

[Series 13: cerebral care 2 · 4 acquisitions, 1 frame shown (5 of 5)]
[im 1/4]
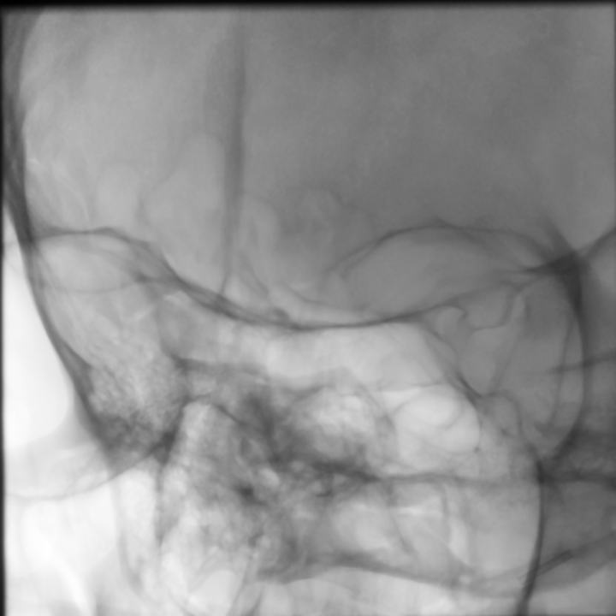

[Series 300: dr. (person_name) · 2 of 25 slices shown]
[im 5/25]
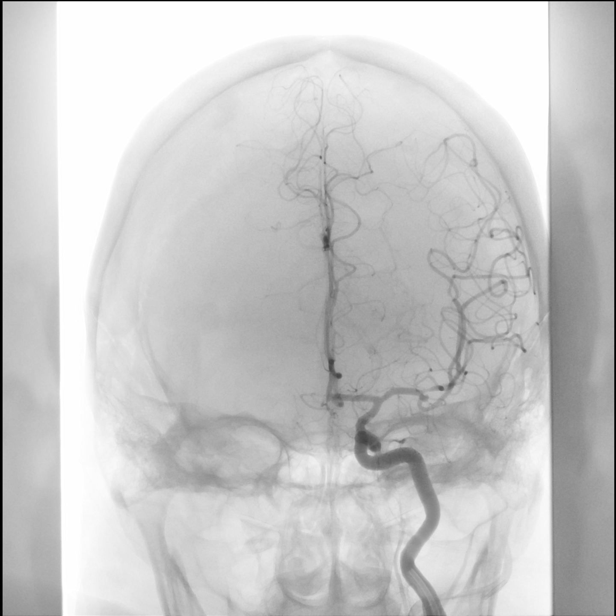
[im 25/25]
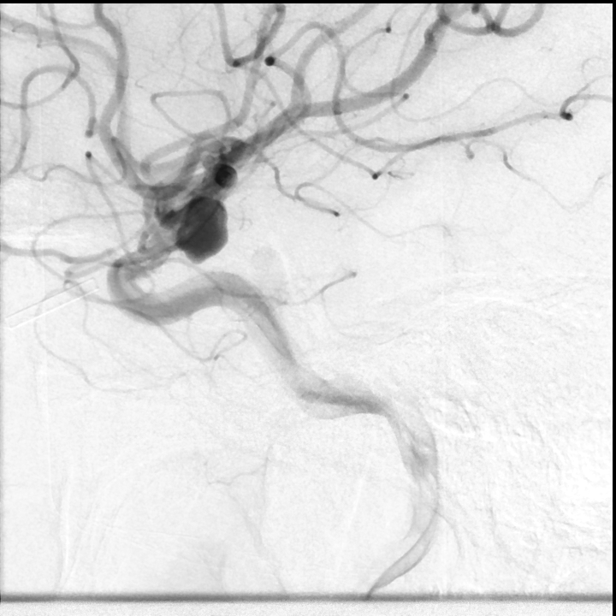

[11 of 24 positions shown; findings below may reference images not displayed]

MEDICATIONS:
5 mg verapamil; [DP] units heparin; 300 mcg nitroglycerin.

ANESTHESIA/SEDATION:
Versed 1 mg IV; Fentanyl 50 mcg IV

Moderate Sedation Time: 55 minutes.

The patient was continuously monitored during the procedure by the
interventional radiology nurse under my direct supervision.

FLUOROSCOPY TIME:  Fluoroscopy Time: 11 minutes 12 seconds (394
mGy).

COMPLICATIONS:
None immediate.
The right hand and wrist were prepped and draped in the usual
sterile fashion.

Using the modified Seldinger technique and a micropuncture kit,
access was gained to the distal right radial artery at the
anatomical snuffbox and a 5 French sheath was placed. Slow intra
arterial infusion of 5,000 GRACIELIA heparin, 5 mg Verapamil and 300 mcg
nitroglicerin diluted in patient's own blood was performed. No
significant fluctuation in patient's blood pressure seen. Then, a
right radial artery roadmap was obtained via sheath side port.

Next, a 5 GRACIELIA 2 glide catheter was navigated over a
0.035" Terumo Glidewire into the right subclavian artery under
fluoroscopic guidance. A right subclavian artery roadmap was
obtained and the catheter was advanced over the wire into the right
vertebral artery under fluoroscopic guidance. Townes and lateral
angiograms of the head were obtained.

The catheter was then placed into the left common carotid artery.
Frontal and lateral angiograms of the neck were obtained. Under
biplane roadmap, the catheter was placed into the left internal
carotid artery. Frontal and lateral angiograms of the head were
obtained. 3D rotational angiograms were acquired and post processed
in a separate workstation under concurrent attending physician
supervision. Selected images were sent to PACS.

The catheter was subsequently placed into the left subclavian
artery. Frontal and lateral angiograms of the neck were obtained.

Next, the catheter was placed into the right common carotid artery.
Frontal and lateral angiograms of the neck were obtained. Under
biplane roadmap, the catheter was placed into the right internal
carotid artery. Frontal and lateral angiograms of the head were
obtained followed by magnified bilateral oblique views and magnified
lateral views of the head, centered on the intracranial right ICA.

The catheter was subsequently withdrawn.

An inflatable band was placed and inflated over the right hand
access site. The vascular sheath was withdrawn and the band was
slowly deflated until brisk flow was noted through the arteriotomy
site. At this point, the band was reinflated with additional 2 cc of
air to obtain patent hemostasis.
FINDINGS: Right radial artery ultrasound and right radial artery angiogram:
The caliber of the distal right radial artery is appropriate for
angiogram access. The right radial artery has high origin from the
brachial artery with normal caliber.

Right vertebral artery angiograms: The right vertebral artery artery
is dominant. The V3 and V4 segments of the bilateral vertebral
arteries, the basilar artery, and bilateral posterior cerebral
arteries are unremarkable. A small infundibulum is noted at the
basilar apex. Luminal caliber is smooth and tapering. No aneurysms
or abnormally high-flow, early draining veins are seen. No regions
of abnormal hypervascularity are noted. The visualized dural sinuses
are patent.

Left CCA angiograms: Cervical angiograms show minimal
atherosclerotic changes in the left carotid bulb. There are no
significant stenoses.

Left ICA angiograms: There is brisk vascular contrast filling of the
left ACA and MCA vascular trees. There is also brisk contrast
opacification of the right ACA vascular tree via a prominent
anterior communicating artery. Contrast opacification of the left
ACA vascular trees seen via posterior communicating artery with
rapid washout. Luminal caliber is smooth and tapering.

A saccular aneurysm at the left MCA bifurcation with a small pseudo
lobulation. The aneurysm measures approximately 2.6 x 2.1 x 2.1 mm
with the neck measuring 1.7 mm.

A saccular aneurysm involving the origin of the left posterior
communicating artery measuring 2.6 x 2.1 mm with the neck measuring
1.6 mm.

A saccular aneurysm of the anterior communicating artery at the left
A1-A2 junction measuring approximately 2.2 x 1.8 mm.

No abnormally high-flow, early draining veins are seen. No regions
of abnormal hypervascularity are noted. The visualized dural sinuses
are patent.

Left subclavian angiograms: Mild atherosclerotic changes at the
origin of the left subclavian artery without hemodynamically
significant stenosis. A small left vertebral artery is seen
throughout the cervical segment with mild luminal irregularities at
the distal V2 segment without hemodynamically significant stenosis.
The thyrocervical trunk appear unremarkable.

Right CCA angiograms: Cervical angiograms show minimal
atherosclerotic changes in the right carotid bulb. There are no
significant stenoses.

Right ICA angiograms: There is brisk vascular contrast filling of
the ACA and MCA vascular trees.

Saccular aneurysm projecting medially from the communicating segment
of the right ICA measuring approximately 7.6 x 5.4 x 4.9 mm with the
neck measuring 1.55 mm.

Mild luminal irregularity is noted in the cavernous and supraclinoid
segments of the right ICA, suggesting early atherosclerotic changes.
No abnormally high-flow, early draining veins are seen. No regions
of abnormal hypervascularity are noted. The visualized dural sinuses
are patent.

PROCEDURE:
No intervention performed.
IMPRESSION: 1. Saccular aneurysm of the communicating segment of the right ICA
measuring up to 7.6 mm.
2. Saccular aneurysm of the left MCA bifurcation with small cerebral
lobular lesion measuring 2.6 mm.
3. Saccular aneurysm at the origin of the left posterior
communicating artery measuring 2.6 mm.
4. Saccular aneurysm of the anterior communicating artery at the
left A1-A2/ACA junction measuring 2.2 mm.

PLAN:
Patient will be scheduled for an office consultation to discuss
aneurysms management.

## 2020-03-16 MED ORDER — MIDAZOLAM HCL 2 MG/2ML IJ SOLN
INTRAMUSCULAR | Status: AC
  Filled 2020-03-16: qty 2

## 2020-03-16 MED ORDER — IOHEXOL 240 MG/ML SOLN
INTRAMUSCULAR | Status: AC
  Filled 2020-03-16: qty 100

## 2020-03-16 MED ORDER — NITROGLYCERIN 1 MG/10 ML FOR IR/CATH LAB
INTRA_ARTERIAL | Status: AC | PRN
  Administered 2020-03-16: 200 ug via INTRA_ARTERIAL

## 2020-03-16 MED ORDER — MIDAZOLAM HCL 2 MG/2ML IJ SOLN
INTRAMUSCULAR | Status: AC | PRN
  Administered 2020-03-16 (×2): 0.5 mg via INTRAVENOUS

## 2020-03-16 MED ORDER — LIDOCAINE HCL 1 % IJ SOLN
INTRAMUSCULAR | Status: AC
  Filled 2020-03-16: qty 20

## 2020-03-16 MED ORDER — VERAPAMIL HCL 2.5 MG/ML IV SOLN
INTRAVENOUS | Status: AC
  Filled 2020-03-16: qty 4

## 2020-03-16 MED ORDER — FENTANYL CITRATE (PF) 100 MCG/2ML IJ SOLN
INTRAMUSCULAR | Status: AC
  Filled 2020-03-16: qty 2

## 2020-03-16 MED ORDER — FENTANYL CITRATE (PF) 100 MCG/2ML IJ SOLN
INTRAMUSCULAR | Status: AC | PRN
  Administered 2020-03-16 (×2): 25 ug via INTRAVENOUS

## 2020-03-16 MED ORDER — IOHEXOL 240 MG/ML SOLN
INTRAMUSCULAR | Status: AC
  Administered 2020-03-16: 21 mL via INTRA_ARTERIAL
  Filled 2020-03-16: qty 100

## 2020-03-16 MED ORDER — LIDOCAINE-PRILOCAINE 2.5-2.5 % EX CREA
1.0000 "application " | TOPICAL_CREAM | CUTANEOUS | Status: AC
  Administered 2020-03-16: 1 via TOPICAL
  Filled 2020-03-16: qty 5

## 2020-03-16 MED ORDER — VERAPAMIL HCL 2.5 MG/ML IV SOLN
INTRAVENOUS | Status: AC | PRN
  Administered 2020-03-16: 5 mg via INTRAVENOUS

## 2020-03-16 MED ORDER — LIDOCAINE HCL 1 % IJ SOLN
INTRAMUSCULAR | Status: AC | PRN
  Administered 2020-03-16: 1 mL

## 2020-03-16 MED ORDER — NITROGLYCERIN 1 MG/10 ML FOR IR/CATH LAB
INTRA_ARTERIAL | Status: AC | PRN
  Administered 2020-03-16: 300 ug via INTRA_ARTERIAL

## 2020-03-16 MED ORDER — HEPARIN SODIUM (PORCINE) 1000 UNIT/ML IJ SOLN
INTRAMUSCULAR | Status: AC | PRN
  Administered 2020-03-16: 5000 [IU] via INTRAVENOUS

## 2020-03-16 MED ORDER — HEPARIN SODIUM (PORCINE) 1000 UNIT/ML IJ SOLN
INTRAMUSCULAR | Status: AC
  Filled 2020-03-16: qty 1

## 2020-03-16 MED ORDER — IODIXANOL 320 MG/ML IV SOLN
100.0000 mL | Freq: Once | INTRAVENOUS | Status: AC | PRN
  Administered 2020-03-16: 60 mL via INTRA_ARTERIAL

## 2020-03-16 MED ORDER — NITROGLYCERIN 1 MG/10 ML FOR IR/CATH LAB
INTRA_ARTERIAL | Status: AC
  Filled 2020-03-16: qty 10

## 2020-03-16 NOTE — Discharge Instructions (Addendum)
Radial Site Care  This sheet gives you information about how to care for yourself after your procedure. Your health care provider may also give you more specific instructions. If you have problems or questions, contact your health care provider. What can I expect after the procedure? After the procedure, it is common to have:  Bruising and tenderness at the catheter insertion area. Follow these instructions at home: Medicines  Take over-the-counter and prescription medicines only as told by your health care provider. Insertion site care  Follow instructions from your health care provider about how to take care of your insertion site. Make sure you: ? Wash your hands with soap and water before you change your bandage (dressing). If soap and water are not available, use hand sanitizer. ? Change your dressing as told by your health care provider. ? Leave stitches (sutures), skin glue, or adhesive strips in place. These skin closures may need to stay in place for 2 weeks or longer. If adhesive strip edges start to loosen and curl up, you may trim the loose edges. Do not remove adhesive strips completely unless your health care provider tells you to do that.  Check your insertion site every day for signs of infection. Check for: ? Redness, swelling, or pain. ? Fluid or blood. ? Pus or a bad smell. ? Warmth.  Do not take baths, swim, or use a hot tub until your health care provider approves.  You may shower 24-48 hours after the procedure, or as directed by your health care provider. ? Remove the dressing and gently wash the site with plain soap and water. ? Pat the area dry with a clean towel. ? Do not rub the site. That could cause bleeding.  Do not apply powder or lotion to the site. Activity   For 24 hours after the procedure, or as directed by your health care provider: ? Do not flex or bend the affected arm. ? Do not push or pull heavy objects with the affected arm. ? Do not  drive yourself home from the hospital or clinic. You may drive 24 hours after the procedure unless your health care provider tells you not to. ? Do not operate machinery or power tools.  Do not lift anything that is heavier than 10 lb (4.5 kg), or the limit that you are told, until your health care provider says that it is safe.  Ask your health care provider when it is okay to: ? Return to work or school. ? Resume usual physical activities or sports. ? Resume sexual activity. General instructions  If the catheter site starts to bleed, raise your arm and put firm pressure on the site. If the bleeding does not stop, get help right away. This is a medical emergency.  If you went home on the same day as your procedure, a responsible adult should be with you for the first 24 hours after you arrive home.  Keep all follow-up visits as told by your health care provider. This is important. Contact a health care provider if:  You have a fever.  You have redness, swelling, or yellow drainage around your insertion site. Get help right away if:  You have unusual pain at the radial site.  The catheter insertion area swells very fast.  The insertion area is bleeding, and the bleeding does not stop when you hold steady pressure on the area.  Your arm or hand becomes pale, cool, tingly, or numb. These symptoms may represent a serious problem   that is an emergency. Do not wait to see if the symptoms will go away. Get medical help right away. Call your local emergency services (911 in the [place] Do not drive yourself to the hospital. Summary  After the procedure, it is common to have bruising and tenderness at the site.  Follow instructions from your health care provider about how to take care of your radial site wound. Check the wound every day for signs of infection.  Do not lift anything that is heavier than 10 lb (4.5 kg), or the limit that you are told, until your health care provider says  that it is safe. This information is not intended to replace advice given to you by your health care provider. Make sure you discuss any questions you have with your health care provider. Document Revised: 08/27/2017 Document Reviewed: 08/27/2017 Elsevier Patient Education  2020 [employer] Cerebral Angiogram, Care After This sheet gives you information about how to care for yourself after your procedure. Your health care provider may also give you more specific instructions. If you have problems or questions, contact your health care provider. What can I expect after the procedure? After the procedure, it is common to have:  Bruising and tenderness at the catheter insertion site.  A mild headache. Follow these instructions at home: Insertion site care  Follow instructions from your health care provider about how to take care of the insertion site. Make sure you: ? Wash your hands with soap and water before and after you change your bandage (dressing). If soap and water are not available, use hand sanitizer. ? Change your dressing as told by your health care provider.  Do not take baths, swim, or use a hot tub until your health care provider approves. You may shower 24-48 hours after the procedure, or as told by your health care provider.  To clean your insertion site: ? Gently wash the site with plain soap and water. ? Pat the area dry with a clean towel. ? Do not rub the site. This may cause bleeding.  Do not apply powder or lotion to the site. Keep the site clean and dry. Infection signs Check your incision area every day for signs of infection. Check for:  Redness, swelling, or pain.  Fluid or blood.  Warmth.  Pus or a bad smell.  Activity  Do not drive for 24 hours if you were given a sedative during your procedure.  Rest as told by your health care provider.  Do not lift anything that is heavier than 10 lb (4.5 kg), or the limit that you are told, until your  health care provider says that it is safe.  Return to your normal activities as told by your health care provider, usually in about a week. Ask your health care provider what activities are safe for you. General instructions   If your insertion site starts to bleed, lie flat and put pressure on the site. If the bleeding does not stop, get help right away. This is a medical emergency.  Do not use any products that contain nicotine or tobacco, such as cigarettes, e-cigarettes, and chewing tobacco. If you need help quitting, ask your health care provider.  Take over-the-counter and prescription medicines only as told by your health care provider.  Drink enough fluid to keep your urine pale yellow. This helps flush the contrast dye from your body.  Keep all follow-up visits as directed by your health care provider. This is important. Contact a health   care provider if:  You have a fever or chills.  You have redness, swelling, or pain around your insertion site.  You have fluid or blood coming from your insertion site.  The insertion site feels warm to the touch.  You have pus or a bad smell coming from your insertion site.  You notice blood collecting in the tissue around the insertion site (hematoma). The hematoma may be painful to the touch. Get help right away if:  You have chest pain or trouble breathing.  You have severe pain or swelling at the insertion site.  The insertion area bleeds, and bleeding continues after 30 minutes of holding steady pressure on the site.  The arm or leg where the catheter was inserted is pale, cold, numb, tingling, or weak.  You have a rash.  You have any symptoms of a stroke. "BE FAST" is an easy way to remember the main warning signs of a stroke: ? B - Balance. Signs are dizziness, sudden trouble walking, or loss of balance. ? E - Eyes. Signs are trouble seeing or a sudden change in vision. ? F - Face. Signs are sudden weakness or numbness of  the face, or the face or eyelid drooping on one side. ? A - Arms. Signs are weakness or numbness in an arm. This happens suddenly and usually on one side of the body. ? S - Speech. Signs are sudden trouble speaking, slurred speech, or trouble understanding what people say. ? T - Time. Time to call emergency services. Write down what time symptoms started.  You have other signs of a stroke, such as: ? A sudden, severe headache with no known cause. ? Nausea or vomiting. ? Seizure. These symptoms may represent a serious problem that is an emergency. Do not wait to see if the symptoms will go away. Get medical help right away. Call your local emergency services (911 in the [place] Do not drive yourself to the hospital. Summary  Bruising and tenderness at the insertion site are common.  Follow your health care provider's instructions about caring for your insertion site. Change dressing and clean the area as instructed.  If your insertion site bleeds, apply direct pressure until bleeding stops.  Return to your normal activities as told by your health care provider. Ask what activities are safe.  Rest and drink plenty of fluids. This information is not intended to replace advice given to you by your health care provider. Make sure you discuss any questions you have with your health care provider. Document Revised: 02/09/2019 Document Reviewed: 02/09/2019 Elsevier Patient Education  2020 [employer] Moderate Conscious Sedation, Adult Sedation is the use of medicines to promote relaxation and relieve discomfort and anxiety. Moderate conscious sedation is a type of sedation. Under moderate conscious sedation, you are less alert than normal, but you are still able to respond to instructions, touch, or both. Moderate conscious sedation is used during short medical and dental procedures. It is milder than deep sedation, which is a type of sedation under which you cannot be easily woken up. It is also  milder than general anesthesia, which is the use of medicines to make you unconscious. Moderate conscious sedation allows you to return to your regular activities sooner. Tell a health care provider about:  Any allergies you have.  All medicines you are taking, including vitamins, herbs, eye drops, creams, and over-the-counter medicines.  Use of steroids (by mouth or creams).  Any problems you or family members have had with   sedatives and anesthetic medicines.  Any blood disorders you have.  Any surgeries you have had.  Any medical conditions you have, such as sleep apnea.  Whether you are pregnant or may be pregnant.  Any use of cigarettes, alcohol, marijuana, or street drugs. What are the risks? Generally, this is a safe procedure. However, problems may occur, including:  Getting too much medicine (oversedation).  Nausea.  Allergic reaction to medicines.  Trouble breathing. If this happens, a breathing tube may be used to help with breathing. It will be removed when you are awake and breathing on your own.  Heart trouble.  Lung trouble. What happens before the procedure? Staying hydrated Follow instructions from your health care provider about hydration, which may include:  Up to 2 hours before the procedure - you may continue to drink clear liquids, such as water, clear fruit juice, black coffee, and plain tea. Eating and drinking restrictions Follow instructions from your health care provider about eating and drinking, which may include:  8 hours before the procedure - stop eating heavy meals or foods such as meat, fried foods, or fatty foods.  6 hours before the procedure - stop eating light meals or foods, such as toast or cereal.  6 hours before the procedure - stop drinking milk or drinks that contain milk.  2 hours before the procedure - stop drinking clear liquids. Medicine Ask your health care provider about:  Changing or stopping your regular medicines.  This is especially important if you are taking diabetes medicines or blood thinners.  Taking medicines such as aspirin and ibuprofen. These medicines can thin your blood. Do not take these medicines before your procedure if your health care provider instructs you not to.  Tests and exams  You will have a physical exam.  You may have blood tests done to show: ? How well your kidneys and liver are working. ? How well your blood can clot. General instructions  Plan to have someone take you home from the hospital or clinic.  If you will be going home right after the procedure, plan to have someone with you for 24 hours. What happens during the procedure?  An IV tube will be inserted into one of your veins.  Medicine to help you relax (sedative) will be given through the IV tube.  The medical or dental procedure will be performed. What happens after the procedure?  Your blood pressure, heart rate, breathing rate, and blood oxygen level will be monitored often until the medicines you were given have worn off.  Do not drive for 24 hours. This information is not intended to replace advice given to you by your health care provider. Make sure you discuss any questions you have with your health care provider. Document Revised: 07/04/2017 Document Reviewed: 11/11/2015 Elsevier Patient Education  2020 [employer] Inc[employer]  

## 2020-03-16 NOTE — Progress Notes (Signed)
Spoke with Radiology- Elilquis to be taken tonight as pt. Did not take lovenox today. Call to to Dee-Dee [phone] (patient's daughter). Dee-Dee made aware that if pt. Has not had her shot she should restart eliqus tonight and resume as normal.  Daughter was given d/c instructions by Freida Busman, and confirmed by me upon d/c.

## 2020-03-19 ENCOUNTER — Other Ambulatory Visit: Payer: Self-pay

## 2020-03-19 ENCOUNTER — Encounter (HOSPITAL_COMMUNITY): Payer: Self-pay | Admitting: Emergency Medicine

## 2020-03-19 ENCOUNTER — Emergency Department (HOSPITAL_COMMUNITY): Payer: 59

## 2020-03-19 ENCOUNTER — Emergency Department (HOSPITAL_COMMUNITY)
Admission: EM | Admit: 2020-03-19 | Discharge: 2020-03-19 | Disposition: A | Discharge status: Home / Self Care | Payer: 59 | Attending: Emergency Medicine | Admitting: Emergency Medicine

## 2020-03-19 DIAGNOSIS — J449 Chronic obstructive pulmonary disease, unspecified: Secondary | ICD-10-CM | POA: Diagnosis not present

## 2020-03-19 DIAGNOSIS — E1143 Type 2 diabetes mellitus with diabetic autonomic (poly)neuropathy: Secondary | ICD-10-CM | POA: Insufficient documentation

## 2020-03-19 DIAGNOSIS — I130 Hypertensive heart and chronic kidney disease with heart failure and stage 1 through stage 4 chronic kidney disease, or unspecified chronic kidney disease: Secondary | ICD-10-CM | POA: Diagnosis present

## 2020-03-19 DIAGNOSIS — N390 Urinary tract infection, site not specified: Secondary | ICD-10-CM | POA: Diagnosis not present

## 2020-03-19 DIAGNOSIS — I5043 Acute on chronic combined systolic (congestive) and diastolic (congestive) heart failure: Secondary | ICD-10-CM | POA: Insufficient documentation

## 2020-03-19 DIAGNOSIS — R531 Weakness: Secondary | ICD-10-CM | POA: Diagnosis not present

## 2020-03-19 DIAGNOSIS — Z794 Long term (current) use of insulin: Secondary | ICD-10-CM | POA: Diagnosis not present

## 2020-03-19 DIAGNOSIS — N1831 Chronic kidney disease, stage 3a: Secondary | ICD-10-CM | POA: Insufficient documentation

## 2020-03-19 DIAGNOSIS — Z79899 Other long term (current) drug therapy: Secondary | ICD-10-CM | POA: Diagnosis not present

## 2020-03-19 DIAGNOSIS — Z87891 Personal history of nicotine dependence: Secondary | ICD-10-CM | POA: Insufficient documentation

## 2020-03-19 LAB — RAPID URINE DRUG SCREEN, HOSP PERFORMED
Amphetamines: NOT DETECTED
Barbiturates: NOT DETECTED
Benzodiazepines: NOT DETECTED
Cocaine: NOT DETECTED
Opiates: NOT DETECTED
Tetrahydrocannabinol: NOT DETECTED

## 2020-03-19 LAB — URINALYSIS, ROUTINE W REFLEX MICROSCOPIC
Bilirubin Urine: NEGATIVE
Glucose, UA: NEGATIVE mg/dL
Hgb urine dipstick: NEGATIVE
Ketones, ur: NEGATIVE mg/dL
Nitrite: NEGATIVE
Protein, ur: NEGATIVE mg/dL
Specific Gravity, Urine: 1.011 (ref 1.005–1.030)
pH: 5 (ref 5.0–8.0)

## 2020-03-19 LAB — CBC WITH DIFFERENTIAL/PLATELET
Abs Immature Granulocytes: 0.03 10*3/uL (ref 0.00–0.07)
Basophils Absolute: 0.1 10*3/uL (ref 0.0–0.1)
Basophils Relative: 1 %
Eosinophils Absolute: 0.2 10*3/uL (ref 0.0–0.5)
Eosinophils Relative: 2 %
HCT: 39.2 % (ref 36.0–46.0)
Hemoglobin: 12.8 g/dL (ref 12.0–15.0)
Immature Granulocytes: 0 %
Lymphocytes Relative: 31 %
Lymphs Abs: 2.8 10*3/uL (ref 0.7–4.0)
MCH: 27.7 pg (ref 26.0–34.0)
MCHC: 32.7 g/dL (ref 30.0–36.0)
MCV: 84.8 fL (ref 80.0–100.0)
Monocytes Absolute: 0.8 10*3/uL (ref 0.1–1.0)
Monocytes Relative: 9 %
Neutro Abs: 5.2 10*3/uL (ref 1.7–7.7)
Neutrophils Relative %: 57 %
Platelets: 275 10*3/uL (ref 150–400)
RBC: 4.62 MIL/uL (ref 3.87–5.11)
RDW: 13.2 % (ref 11.5–15.5)
WBC: 9.1 10*3/uL (ref 4.0–10.5)
nRBC: 0 % (ref 0.0–0.2)

## 2020-03-19 LAB — APTT: aPTT: 30 seconds (ref 24–36)

## 2020-03-19 LAB — COMPREHENSIVE METABOLIC PANEL
ALT: 16 U/L (ref 0–44)
AST: 23 U/L (ref 15–41)
Albumin: 4.1 g/dL (ref 3.5–5.0)
Alkaline Phosphatase: 68 U/L (ref 38–126)
Anion gap: 16 — ABNORMAL HIGH (ref 5–15)
BUN: 18 mg/dL (ref 6–20)
CO2: 24 mmol/L (ref 22–32)
Calcium: 8.3 mg/dL — ABNORMAL LOW (ref 8.9–10.3)
Chloride: 97 mmol/L — ABNORMAL LOW (ref 98–111)
Creatinine, Ser: 1.71 mg/dL — ABNORMAL HIGH (ref 0.44–1.00)
GFR calc Af Amer: 39 mL/min — ABNORMAL LOW (ref >60)
GFR calc non Af Amer: 34 mL/min — ABNORMAL LOW (ref >60)
Glucose, Bld: 183 mg/dL — ABNORMAL HIGH (ref 70–99)
Potassium: 3.1 mmol/L — ABNORMAL LOW (ref 3.5–5.1)
Sodium: 137 mmol/L (ref 135–145)
Total Bilirubin: 0.6 mg/dL (ref 0.3–1.2)
Total Protein: 8 g/dL (ref 6.5–8.1)

## 2020-03-19 LAB — ETHANOL: Alcohol, Ethyl (B): <10 mg/dL (ref <10)

## 2020-03-19 LAB — DIGOXIN LEVEL: Digoxin Level: 0.7 ng/mL — ABNORMAL LOW (ref 0.8–2.0)

## 2020-03-19 IMAGING — CT CT HEAD W/O CM
3 series · 16 of 47 positions shown, 19 images · non-contrast
Comparison: [DATE]

CLINICAL DATA: Dysphagia and hypertension.

EXAM:
CT HEAD WITHOUT CONTRAST
TECHNIQUE: Contiguous axial images were obtained from the base of the skull
through the vertex without intravenous contrast.

[Series 2: head w o · axial · 0.43mm/px · z∈[+317,+457]mm · 10 of 34 slices shown, 13 images]
[im 3/34  brain]
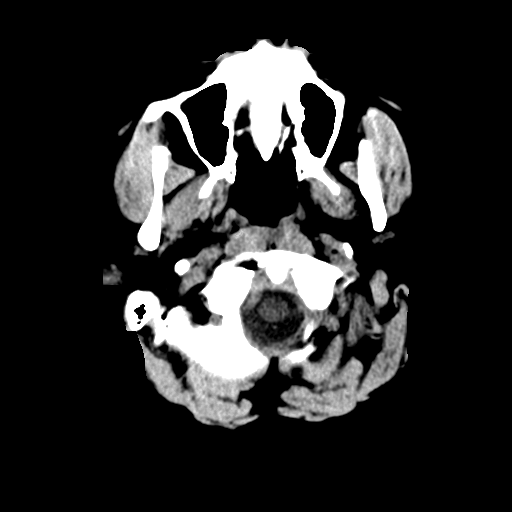
[im 3/34  bone]
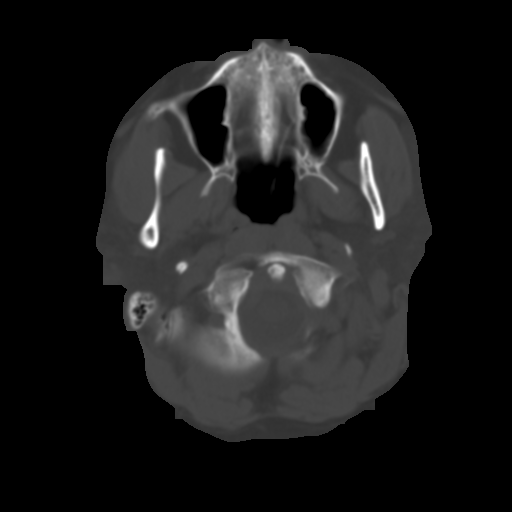
[im 6/34  brain]
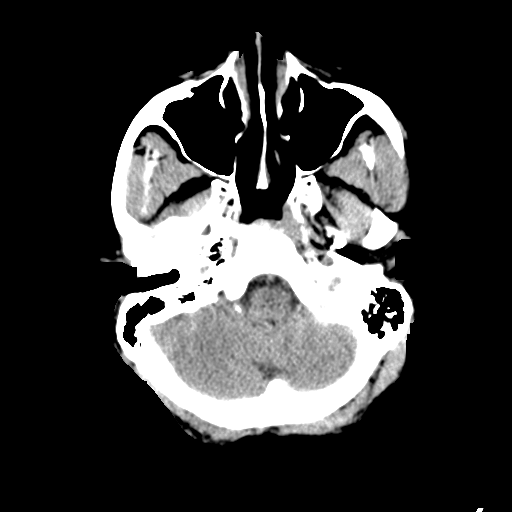
[im 10/34  brain]
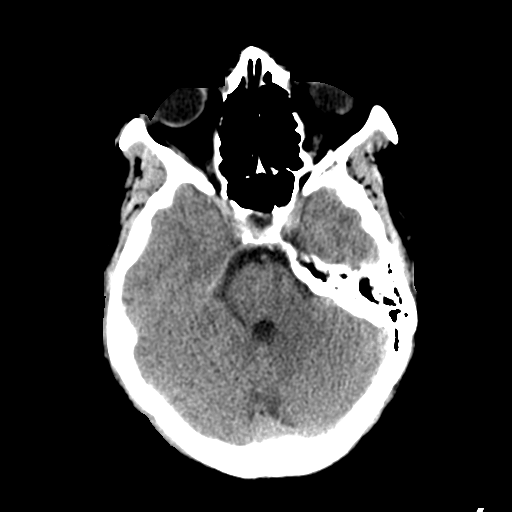
[im 12/34  brain]
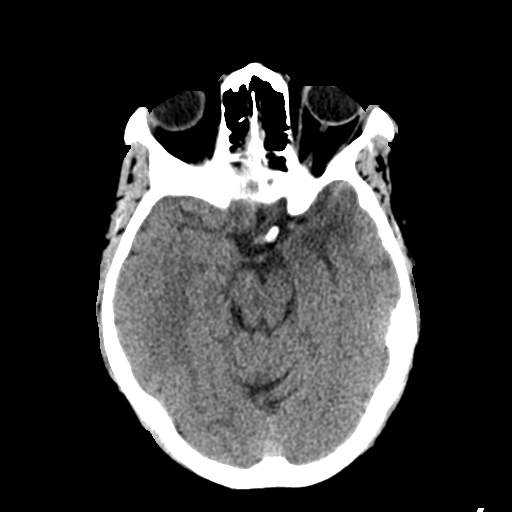
[im 15/34  brain]
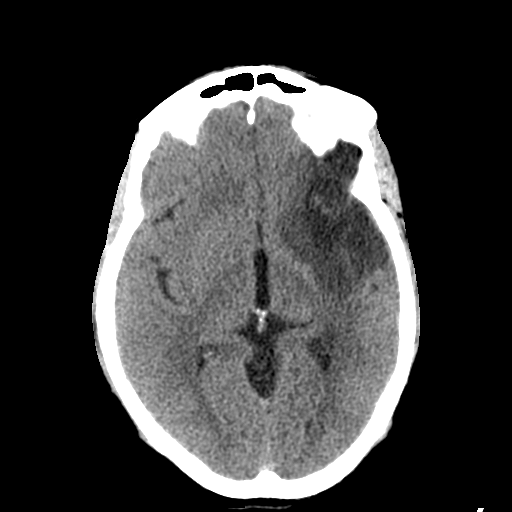
[im 15/34  bone]
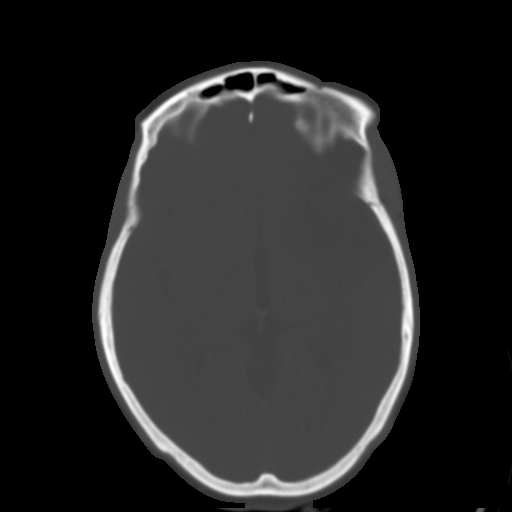
[im 19/34  brain]
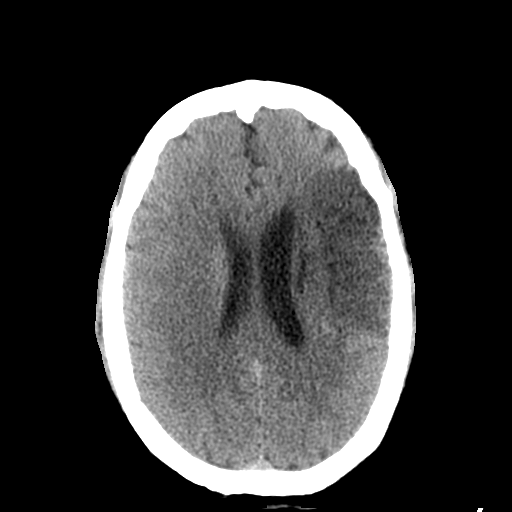
[im 22/34  brain]
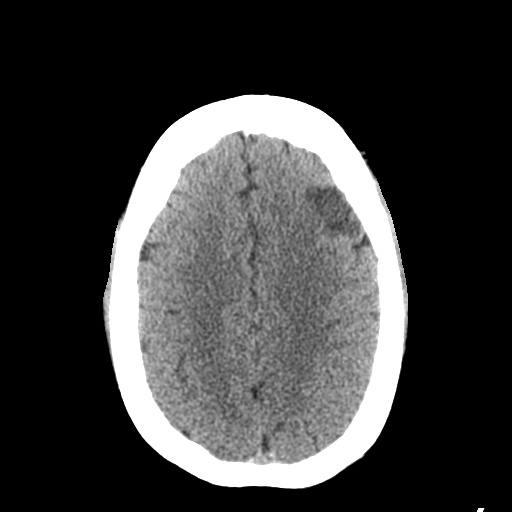
[im 26/34  brain]
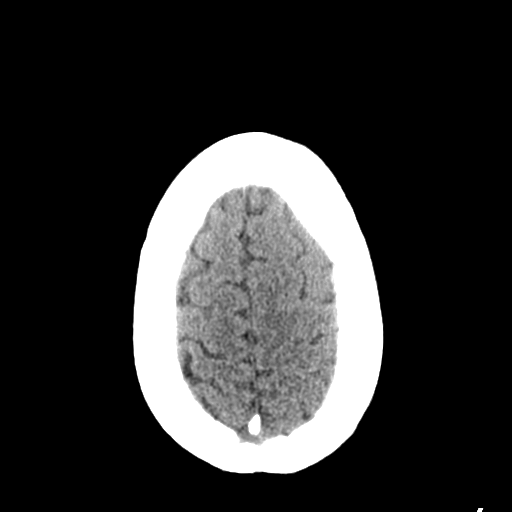
[im 28/34  brain]
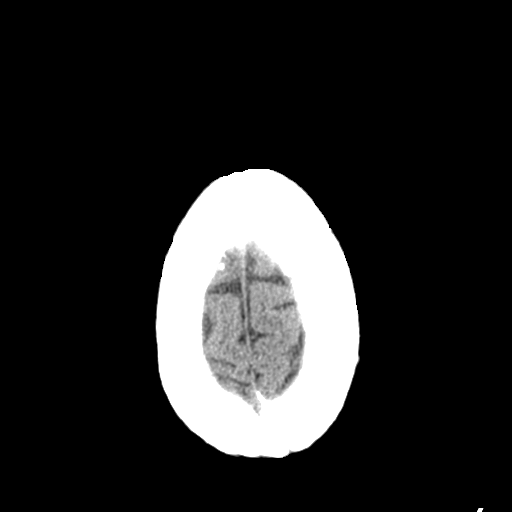
[im 28/34  bone]
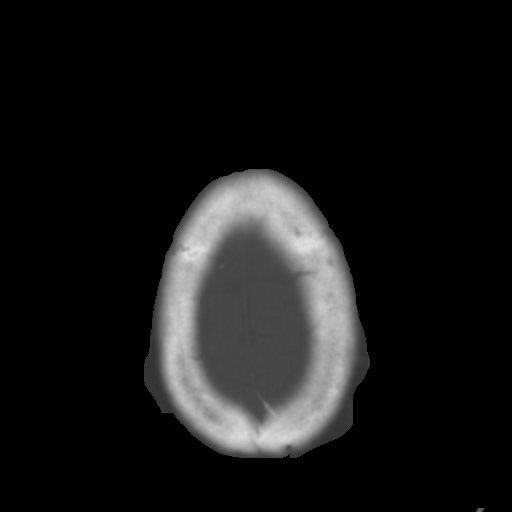
[im 31/34  brain]
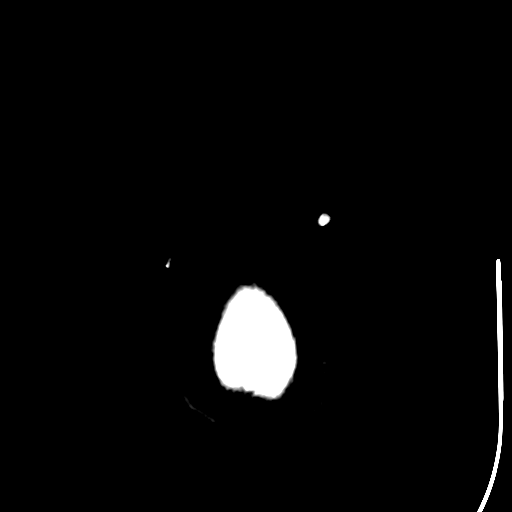

[Series 4: coronal soft · coronal · 0.34mm/px · 3 of 67 slices shown]
[im 23/67  brain]
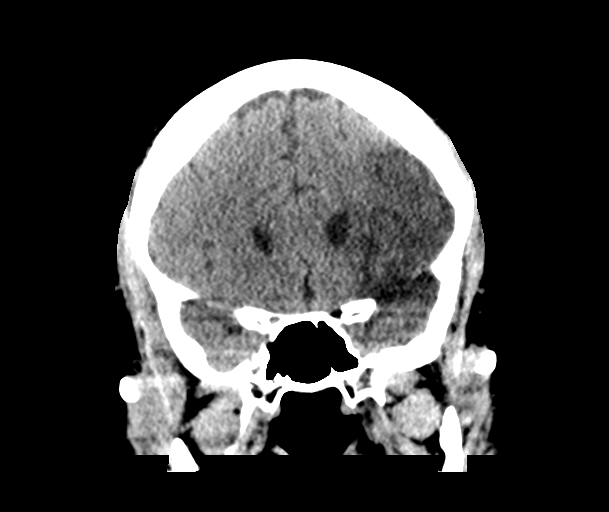
[im 30/67  brain]
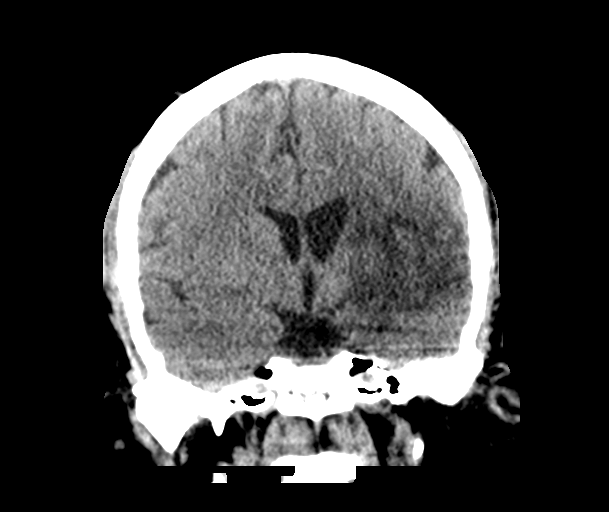
[im 37/67  brain]
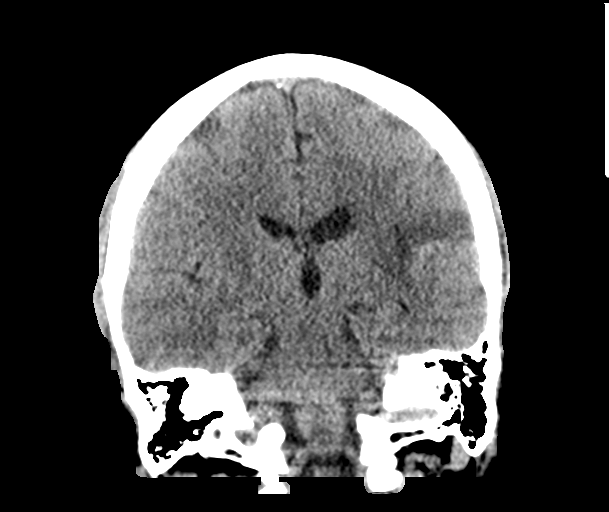

[Series 5: sagittal soft · sagittal · 0.38mm/px · 3 of 67 slices shown]
[im 23/67  brain]
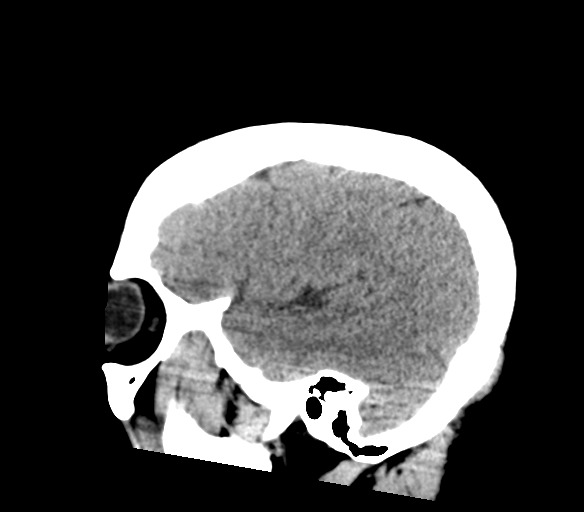
[im 34/67  brain]
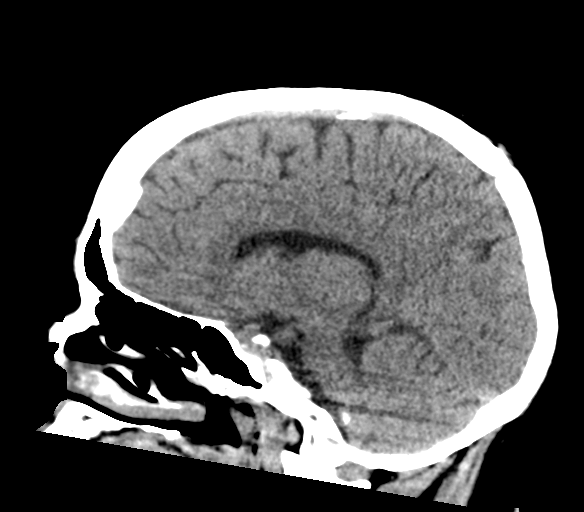
[im 45/67  brain]
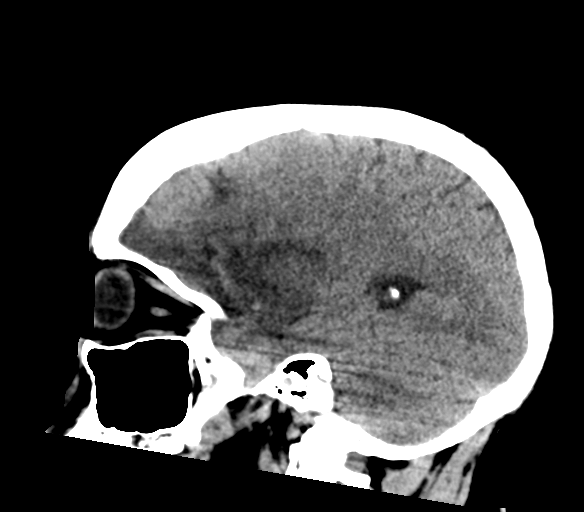

[16 of 47 positions shown; findings below may reference images not displayed]

FINDINGS: Brain: There is mild cerebral atrophy with widening of the
extra-axial spaces and ventricular dilatation.
There are areas of decreased attenuation within the white matter
tracts of the supratentorial brain, consistent with microvascular
disease changes.

A large area of cortical low attenuation and adjacent chronic white
matter low attenuation is seen within the left frontal and left
frontal parietal regions. Associated ex vacuo dilatation of the left
lateral ventricle is seen.

There is no evidence of mass effect or midline shift.

Vascular: No hyperdense vessel or unexpected calcification.

Skull: Normal. Negative for acute fracture or focal lesion. A
chronic left-sided nasal bone fracture is noted.

Sinuses/Orbits: No acute finding.

Other: None.
IMPRESSION: 1. Large area of chronic left MCA distribution infarct.
2. No acute intracranial abnormality.

## 2020-03-19 MED ORDER — POTASSIUM CHLORIDE CRYS ER 20 MEQ PO TBCR
40.0000 meq | EXTENDED_RELEASE_TABLET | Freq: Once | ORAL | Status: AC
  Administered 2020-03-19: 40 meq via ORAL
  Filled 2020-03-19: qty 2

## 2020-03-19 MED ORDER — CEPHALEXIN 250 MG PO CAPS
250.0000 mg | ORAL_CAPSULE | Freq: Four times a day (QID) | ORAL | 0 refills | Status: DC
Start: 2020-03-19 — End: 2020-04-19

## 2020-03-19 MED ORDER — CEPHALEXIN 500 MG PO CAPS
500.0000 mg | ORAL_CAPSULE | Freq: Once | ORAL | Status: AC
  Administered 2020-03-19: 500 mg via ORAL
  Filled 2020-03-19: qty 1
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
## 2020-03-19 NOTE — Discharge Instructions (Addendum)
The testing today did not show any serious problems.  You may have a urinary tract infection.  We are starting you on an antibiotic, and sending a urine culture.  Start taking the antibiotic prescription, tomorrow morning.  Please follow-up with your primary care doctor for checkup in a week or so, sooner as needed for problems.

## 2020-03-19 NOTE — ED Triage Notes (Signed)
Pt has stroke at the end of June, is currently receiving therapy.  Pt has dysphasia.  Pt here for hypertension, however BP is 99/70.  Dr Stark Jock in triage.  LKW 0630 per daughter.

## 2020-03-21 LAB — URINE CULTURE: Culture: NO GROWTH

## 2020-03-22 ENCOUNTER — Other Ambulatory Visit (HOSPITAL_COMMUNITY): Payer: Self-pay | Admitting: Neuroradiology

## 2020-03-22 ENCOUNTER — Telehealth (HOSPITAL_COMMUNITY): Payer: Self-pay | Admitting: Radiology

## 2020-03-22 DIAGNOSIS — I671 Cerebral aneurysm, nonruptured: Secondary | ICD-10-CM

## 2020-03-22 NOTE — Telephone Encounter (Signed)
Called pt's sister to schedule pt's aneurysm tx with Dr. Debbrah Alar, no answer and VM is full. JM

## 2020-03-25 ENCOUNTER — Other Ambulatory Visit (HOSPITAL_COMMUNITY)
Admission: RE | Admit: 2020-03-25 | Discharge: 2020-03-25 | Disposition: A | Discharge status: Home / Self Care | Payer: 59 | Source: Ambulatory Visit | Attending: Neuroradiology | Admitting: Neuroradiology

## 2020-03-25 DIAGNOSIS — Z01812 Encounter for preprocedural laboratory examination: Secondary | ICD-10-CM | POA: Diagnosis present

## 2020-03-25 DIAGNOSIS — Z20822 Contact with and (suspected) exposure to covid-19: Secondary | ICD-10-CM | POA: Diagnosis not present

## 2020-03-25 LAB — SARS CORONAVIRUS 2 (TAT 6-24 HRS): SARS Coronavirus 2: NEGATIVE

## 2020-03-27 ENCOUNTER — Inpatient Hospital Stay: Payer: Self-pay | Admitting: Neurology

## 2020-03-28 ENCOUNTER — Encounter (HOSPITAL_COMMUNITY): Payer: Self-pay | Admitting: Vascular Surgery

## 2020-03-28 ENCOUNTER — Other Ambulatory Visit: Payer: Self-pay | Admitting: Physician Assistant

## 2020-03-28 ENCOUNTER — Other Ambulatory Visit: Payer: Self-pay | Admitting: Student

## 2020-03-28 ENCOUNTER — Telehealth: Payer: Self-pay | Admitting: Student

## 2020-03-28 ENCOUNTER — Other Ambulatory Visit (HOSPITAL_COMMUNITY): Payer: Self-pay | Admitting: Interventional Radiology

## 2020-03-28 ENCOUNTER — Other Ambulatory Visit: Payer: Self-pay

## 2020-03-28 DIAGNOSIS — I671 Cerebral aneurysm, nonruptured: Secondary | ICD-10-CM

## 2020-03-28 NOTE — Telephone Encounter (Signed)
   I received a message from IR stating that this patient is scheduled for brain aneurysm treatment tomorrow morning and anesthesia would like to know it is safe to proceed with her surgery under general anesthesia.   I replied back that she has a history of chronic systolic CHF and persistent atrial fibrillation in the setting of drug use. Her last evaluation was in 11/2019 as she canceled follow-up visits. By review of notes, she was admitted for a CVA in 01/2020. Her RCRI Risk is high at 11% risk of a major cardiac event and she would be at intermediate to high-risk from a cardiac perspective. She is also on Eliquis for anticoagulation so would make sure from an IR perspective this did not need to be held for her upcoming procedure.   Signed, [name], PA-C 03/28/2020, 4:54 PM

## 2020-03-28 NOTE — Progress Notes (Signed)
Anesthesia has agreed to evaluate the patient upon arrival on 03/29/20 to make the determination if she is an appropriate candidate for general anesthesia. Will proceed based on Dr. Glynda Jaeger evaluation. JM

## 2020-03-28 NOTE — Progress Notes (Signed)
Anesthesia Chart Review:   Case: [id number] Date/Time: 03/29/20 0815   Procedure: IR WITH ANESTHESIA EMBOLIZATION (N/A )   Anesthesia type: General   Pre-op diagnosis: BRAIN ANEURYSM   Location: [facility]   Surgeons: de Rosario Jacks, MD      DISCUSSION: Patient is a 54 year old female scheduled for the above procedure.  History includes former smoker, DM2, HTN, GERD, COPD, cardiomyopathy, chronic combined systolic and diastolic CHF, afib (diagnosed ~ 04/2019), CVA (12/26/19, s/p thrombectomy by IR; right sided weakness, dysarthria, expressive phasia), anemia, fatty liver, ascending TAA (4 cm 10/2019), cocaine abuse, hysterectomy (09/10/11). Normal coronaries 2010.   - ED visit 03/19/20 for generalized weakness and HTN at home. BP 106/85 in ED. Cr 1.71 (up from 1.49 on 03/16/20 and 1.07 on 01/30/20. CBC WNL. Glucose 183. UA moderate leukocytes, negative nitrites, 21-50 WBC. UDS and ethyl alcohol WNL. Head CT showed large area of chronic left MCA infarct, but no acute abnormality. She was discharged home on Keflex for UTI.   - Admission 2/77/41-09/12/76 for embolic left MCA CVA, s/p mechanical thrombectomy (2 passes achieved TICI 3 into the ACA and MCA territory) by IR. She was initially found unresponsive at home after family heard her fall. CBG 379. Slightly more responsive after Narcan, but with right sided deficits. Code Stroke was called by EMS. Imaging revealed no intracranial bleeding, but evolving hypodensity in the left MCA territory and left ICA terminus occlusion. She was transferred from [facility] to [facility] for thrombectomy (no tPA due to Eliquis). Had incidental finding of right ICA aneurysm (and likely left MCA bifurcation aneurysm and left ICA PCA aneurysm vs infundibulum). Required intubation 5/23-5/27/21. UDS + cocaine. LVEF remained low at 30-35%, moderate AS. Discharged to CIR for rehab therapies. Out-patient IR follow-up for aneurysms.  - Admission 05/2019 for acute on  chronic systolic CHF. Found to be in afib with RVR (initially diagnosed 04/2019 during ED visit for abdominal pain). UDS negative for cocaine. IV Cardizem transitioned to b-blocker used for rate control. LVEF still depressed at 35%. No further cardiac testing recommended at that time. Could consider DCCV in the future if patient compliant. Troponins negative. Incidental finding of TAA with repeat imaging in 04/2020 planned. - Admission 01/7671 for acute systolic CHF in setting of cocaine use and severe HTN with medication non-compliance. Troponins negative. Cardiologist Carlyle Dolly, MD was consulted. Medical management recommended with follow-up echo in 3 months. Would consider further work-up at that time if LVEF not improved.  - Admission 07/2016 for hypertensive emergency with acute pulmonary edema, acute diastolic CHF in setting of medication non-compliance due to finacnes. Required ICU admission with Nitro gtt. EF 50-55%. Discharged on metoprolol, lisinopril-HTCZ.  Patient has not been seen by cardiology since her 12/2019 CVA. Last cardiology evaluation was on 11/24/19 by Bernerd Pho, PA-C. She notes patient was considered for DCCV 06/29/19, but UDS + cocaine and hyperglycemic and procedure canceled. BP better controlled. Denied cocaine use since 06/2019. No acute CHF symptoms. Continued on Coreg, Digoxin, Hydralazine Torsemide (intolerate to ACE-I/ARB). She discussed with cardiologist Carlyle Dolly, MD. Would recommend rate control for afib for now, but would consider DCCV in the future should she prove to be more compliant. Follow-up echo ordered. (There was no specific mention of future ischemic testing. None ordered during her 05/2019 admission.) (Echo done on 12/27/19 during CVA admission and showed stable low LVEF 30-35%. Cardiology was not consulted during her CVA admission.)  03/25/20 COVID-19 test negative. Her Creatinine  was up to 1.7 on 03/19/20. She is for repeat labs on the day of  procedure per IR. I have communicated Creatinine results with Anderson Malta in IR. She reported that patient had been switched from Eliquis to [facility] for this procedure.  Discussed available information with anesthesiologist Tamela Gammon, MD. She is a same day work-up. EF ~ 30-35% since 08/2018 with last cardiology evaluation in 11/2018 just prior to CVA. Known chronic afib. Negative UDS 03/19/20. Last A1c high at 11.6, but > 3 months ago.  Staff still attempting to talk with patient and/or medical power of attorney, so unclear if DM control has improved recently. Anesthesia team to evaluate on the day of procedure.    VS: LMP 08/21/2011   BP Readings from Last 3 Encounters:  03/19/20 127/76  03/16/20 106/78  02/16/20 110/62   Pulse Readings from Last 3 Encounters:  03/19/20 78  03/16/20 (!) 57  02/16/20 82    PROVIDERS: Inda Coke, PA is PCP Carlyle Dolly, MD is cardiologist Antony Contras, MD is neurolgoist   LABS: Last lab results include: Lab Results  Component Value Date   WBC 9.1 03/19/2020   HGB 12.8 03/19/2020   HCT 39.2 03/19/2020   PLT 275 03/19/2020   GLUCOSE 183 (H) 03/19/2020   ALT 16 03/19/2020   AST 23 03/19/2020   NA 137 03/19/2020   K 3.1 (L) 03/19/2020   CL 97 (L) 03/19/2020   CREATININE 1.71 (H) 03/19/2020   BUN 18 03/19/2020   CO2 24 03/19/2020   INR 1.2 03/16/2020   HGBA1C 11.6 (H) 12/27/2019     IMAGES: Diagnostic cerebral angiogram 03/16/20: IMPRESSION: 1. Saccular aneurysm of the communicating segment of the right ICA measuring up to 7.6 mm. 2. Saccular aneurysm of the left MCA bifurcation with small cerebral lobular lesion measuring 2.6 mm. 3. Saccular aneurysm at the origin of the left posterior communicating artery measuring 2.6 mm. 4. Saccular aneurysm of the anterior communicating artery at the left A1-A2/ACA junction measuring 2.2 mm. PLAN: Patient will be scheduled for an office consultation to discuss aneurysms  management.  CTA chest 10/31/19: IMPRESSION: 1. Stable ascending aortic aneurysm measuring 4 cm. No aortic dissection. 2. No pulmonary embolus. 3. Fatty infiltration of liver.   EKG: 03/19/20: Atrial fibrillation at 83 bpm Septal infarct , age undetermined Abnormal ECG Since last tracing rate slower Otherwise no significant change Confirmed by Daleen Bo [phone]) on 03/19/2020 4:07:03 PM   CV: Echo 12/27/19: IMPRESSIONS  1. Left ventricular ejection fraction, by estimation, is 30 to 35%. The  left ventricle has moderately decreased function. The left ventricle  demonstrates global hypokinesis. The left ventricular internal cavity size  was mildly to moderately dilated.  There is moderate asymmetric left ventricular hypertrophy. Left  ventricular diastolic function could not be evaluated.  2. Right ventricular systolic function is normal. The right ventricular  size is normal.  3. Left atrial size was severely dilated.  4. Right atrial size was severely dilated.  5. The mitral valve is grossly normal. Trivial mitral valve  regurgitation. No evidence of mitral stenosis.  6. The aortic valve is grossly normal. Aortic valve regurgitation is not  visualized. Moderate aortic valve stenosis. Aortic valve mean gradient  measures 12.0 mmHg. Aortic valve peak gradient measures 19.1 mmHg.  Aortic valve area, by VTI measures 1.20 cm.  (Comparison: 05/11/19: LVEF 35%, LV diffusely hypokinetic, RV moderately reduced systolic function, mild MR, mild-moderate TR, mild AS, normal PASP; 08/24/18: LVEF 30-35%, diffuse LV hypokinesis, mild AS;  07/10/16: LVEF 50-55%, diffuse LV hypokinesis, moderate AS)  Cardiac cath 10/04/08:  CONCLUSIONS:  1. Preserved left ventricular function.  2. No significant high-grade obstruction noted with 2 proximal      diagonal branches that overlap as noted in the above study.  3. No evidence of aortic dissection.   Past Medical History:  Diagnosis Date  .  A-fib ([place])   . Allergy   . Anemia   . Arthritis   . CHF (congestive heart failure) ([place])    a. EF at 30-35% by echo in 08/2018 b. EF at 35% by repeat echo in 05/2019  . Chronic abdominal pain   . Cocaine abuse ([place])   . COPD (chronic obstructive pulmonary disease) ([place])   . Essential hypertension, benign   . Fatty liver   . GERD (gastroesophageal reflux disease)   . Gout 2016  . Normal coronary arteries    3/10 - following abnormal Myoview  . Ovarian cyst   . Stroke ([place]) 12/26/2019  . Thoracic ascending aortic aneurysm ([facility])    4 cm 10/31/19 CTA  . Type 2 diabetes mellitus ([place])     Past Surgical History:  Procedure Laterality Date  . ABDOMINAL HYSTERECTOMY  09/10/2011   Procedure: HYSTERECTOMY ABDOMINAL;  Surgeon: Jonnie Kind, MD;  Location: [facility];  Service: Gynecology;  Laterality: N/A;  Abdominal hysterectomy  . CESAREAN SECTION  [id number], and 1994  . CHOLECYSTECTOMY  1995  . IR 3D INDEPENDENT WKST  03/16/2020  . IR ANGIO INTRA EXTRACRAN SEL INTERNAL CAROTID BILAT MOD SED  03/16/2020  . IR ANGIO VERTEBRAL SEL SUBCLAVIAN INNOMINATE UNI L MOD SED  03/16/2020  . IR ANGIO VERTEBRAL SEL VERTEBRAL UNI R MOD SED  03/16/2020  . IR CT HEAD LTD  12/27/2019  . IR PERCUTANEOUS ART THROMBECTOMY/INFUSION INTRACRANIAL INC DIAG ANGIO  12/27/2019  . IR US GUIDE VASC ACCESS RIGHT  12/27/2019  . IR US GUIDE VASC ACCESS RIGHT  03/16/2020  . RADIOLOGY WITH ANESTHESIA N/A 12/27/2019   Procedure: IR WITH ANESTHESIA;  Surgeon: Luanne Bras, MD;  [facility];  Service: Radiology;  Laterality: N/A;  . SCAR REVISION  09/10/2011   Procedure: SCAR REVISION;  Surgeon: Jonnie Kind, MD;  Location: [facility];  Service: Gynecology;  Laterality: N/A;  Wide Excision of old Cicatrix  . TUBAL LIGATION  1994    MEDICATIONS: No current facility-administered medications for this encounter.   Marland Kitchen acetaminophen (TYLENOL) 325 MG tablet  . apixaban (ELIQUIS) 5 MG TABS tablet  . BD PEN NEEDLE NANO U/F 32G X  4 MM MISC  . carvedilol (COREG) 25 MG tablet  . cephALEXin (KEFLEX) 250 MG capsule  . citalopram (CELEXA) 10 MG tablet  . digoxin (LANOXIN) 0.125 MG tablet  . enoxaparin (LOVENOX) 150 MG/ML injection  . gabapentin (NEURONTIN) 100 MG capsule  . hydrALAZINE (APRESOLINE) 25 MG tablet  . insulin glargine (LANTUS) 100 UNIT/ML Solostar Pen  . isosorbide mononitrate (IMDUR) 30 MG 24 hr tablet  . metFORMIN (GLUCOPHAGE) 1000 MG tablet  . Multiple Vitamin (MULTIVITAMIN WITH MINERALS) TABS tablet  . nitroGLYCERIN (NITROSTAT) 0.4 MG SL tablet  . nystatin ointment (MYCOSTATIN)  . rosuvastatin (CRESTOR) 20 MG tablet  . tiZANidine (ZANAFLEX) 2 MG tablet  . torsemide (DEMADEX) 10 MG tablet   She is not currently taking Lovenox or nystatin ointment.   Myra Gianotti, PA-C Surgical Short Stay/Anesthesiology [facility] Phone [phone] [facility] Phone [phone] 03/28/2020 4:29 PM

## 2020-03-28 NOTE — Anesthesia Preprocedure Evaluation (Addendum)
Anesthesia Evaluation  Patient identified by MRN, date of birth, ID band Patient awake    Reviewed: Allergy & Precautions, NPO status , Patient's Chart, lab work & pertinent test results  History of Anesthesia Complications Negative for: history of anesthetic complications  Airway Mallampati: II  TM Distance: >3 FB Neck ROM: Full    Dental no notable dental hx.    Pulmonary neg pulmonary ROS, former smoker,    Pulmonary exam normal        Cardiovascular hypertension, Pt. on medications and Pt. on home beta blockers + Peripheral Vascular Disease and +CHF  Normal cardiovascular exam+ dysrhythmias Atrial Fibrillation   Echo 12/27/19: IMPRESSIONS  1. Left ventricular ejection fraction, by estimation, is 30 to 35%. The  left ventricle has moderately decreased function. The left ventricle  demonstrates global hypokinesis. The left ventricular internal cavity size  was mildly to moderately dilated.  There is moderate asymmetric left ventricular hypertrophy. Left  ventricular diastolic function could not be evaluated.  2. Right ventricular systolic function is normal. The right ventricular  size is normal.  3. Left atrial size was severely dilated.  4. Right atrial size was severely dilated.  5. The mitral valve is grossly normal. Trivial mitral valve  regurgitation. No evidence of mitral stenosis.  6. The aortic valve is grossly normal. Aortic valve regurgitation is not  visualized. Moderate aortic valve stenosis. Aortic valve mean gradient  measures 12.0 mmHg. Aortic valve peak gradient measures 19.1 mmHg.  Aortic valve area, by VTI measures 1.20 cm.  (Comparison: 05/11/19: LVEF 35%, LV diffusely hypokinetic, RV moderately reduced systolic function, mild MR, mild-moderate TR, mild AS, normal PASP; 08/24/18: LVEF 30-35%, diffuse LV hypokinesis, mild AS; 07/10/16: LVEF 50-55%, diffuse LV hypokinesis, moderate AS)    Neuro/Psych CVA, Residual Symptoms negative psych ROS   GI/Hepatic Neg liver ROS, GERD  ,  Endo/Other  negative endocrine ROSdiabetes  Renal/GU Renal InsufficiencyRenal disease     Musculoskeletal negative musculoskeletal ROS (+)   Abdominal   Peds  Hematology negative hematology ROS (+)   Anesthesia Other Findings   Reproductive/Obstetrics                           Anesthesia Physical Anesthesia Plan  ASA: IV  Anesthesia Plan: General   Post-op Pain Management:    Induction: Intravenous  PONV Risk Score and Plan: 3 and Ondansetron, Dexamethasone and Treatment may vary due to age or medical condition  Airway Management Planned: Oral ETT  Additional Equipment: Arterial line  Intra-op Plan:   Post-operative Plan: Extubation in OR  Informed Consent: I have reviewed the patients History and Physical, chart, labs and discussed the procedure including the risks, benefits and alternatives for the proposed anesthesia with the patient or authorized representative who has indicated his/her understanding and acceptance.     Dental advisory given and Consent reviewed with POA  Plan Discussed with: Anesthesiologist and CRNA  Anesthesia Plan Comments: (See PAT note written 03/28/2020 by Myra Gianotti, PA-C. SAME DAY WORK-UP.     )      Anesthesia Quick Evaluation

## 2020-03-28 NOTE — Progress Notes (Addendum)
I called Robin [name] phone, there is no voice mail.  I called the preferred number, this is her sister's number, Robin [name]. Robin [name] is Robin. [name]' sister, who reports that patient has expressive aphasheia.  Robin [name] said that she that medical power of attorney was completed when Robin [name] was in ICU.  Robin. [name] is at work, I will call her back later today.  64 I spoke with Robin [name] regarding her sister, I asked Robin [name] to ask for a paper for Robin [name] to sign to give permision to speak with Robin [name] and anyone else she would like her to speak to.  Robin [name] has type II diabetes, Robin [name] is aware that patient should not take Metformin in am. Robin [name] reports that CBGs run in the 200's - 212 this am. I instructed Robin [name] to instruct Robin [name] to take 1/2 of Lantus tonight- 30 units.  I instructed patient to check CBG after awaking and every 2 hours until arrival  to the hospital.  I Instructed patient if CBG is less than 70 to take 4 Glucose Tablets or 1 tube of Glucose Gel or 1/2 cup of a clear juice. Recheck CBG in 15 minutes to see if CBG is responding to the juice.

## 2020-03-29 ENCOUNTER — Other Ambulatory Visit: Payer: Self-pay

## 2020-03-29 ENCOUNTER — Encounter (HOSPITAL_COMMUNITY): Payer: Self-pay

## 2020-03-29 ENCOUNTER — Inpatient Hospital Stay (HOSPITAL_COMMUNITY)
Admission: RE | Admit: 2020-03-29 | Discharge: 2020-03-29 | Disposition: A | Discharge status: Home / Self Care | Payer: 59 | Source: Ambulatory Visit | Attending: Neuroradiology | Admitting: Neuroradiology

## 2020-03-29 ENCOUNTER — Ambulatory Visit (HOSPITAL_COMMUNITY)
Admission: RE | Admit: 2020-03-29 | Discharge: 2020-03-29 | Disposition: A | Discharge status: Home / Self Care | Payer: 59 | Attending: Neuroradiology | Admitting: Neuroradiology

## 2020-03-29 ENCOUNTER — Encounter (HOSPITAL_COMMUNITY)
Admission: RE | Disposition: A | Discharge status: Home / Self Care | Payer: Self-pay | Source: Home / Self Care | Attending: Neuroradiology

## 2020-03-29 DIAGNOSIS — K219 Gastro-esophageal reflux disease without esophagitis: Secondary | ICD-10-CM | POA: Diagnosis not present

## 2020-03-29 DIAGNOSIS — I429 Cardiomyopathy, unspecified: Secondary | ICD-10-CM | POA: Diagnosis not present

## 2020-03-29 DIAGNOSIS — E119 Type 2 diabetes mellitus without complications: Secondary | ICD-10-CM | POA: Diagnosis not present

## 2020-03-29 DIAGNOSIS — I712 Thoracic aortic aneurysm, without rupture: Secondary | ICD-10-CM | POA: Diagnosis not present

## 2020-03-29 DIAGNOSIS — I4891 Unspecified atrial fibrillation: Secondary | ICD-10-CM | POA: Insufficient documentation

## 2020-03-29 DIAGNOSIS — Z539 Procedure and treatment not carried out, unspecified reason: Secondary | ICD-10-CM | POA: Insufficient documentation

## 2020-03-29 DIAGNOSIS — K760 Fatty (change of) liver, not elsewhere classified: Secondary | ICD-10-CM | POA: Diagnosis not present

## 2020-03-29 DIAGNOSIS — R531 Weakness: Secondary | ICD-10-CM | POA: Diagnosis not present

## 2020-03-29 DIAGNOSIS — I110 Hypertensive heart disease with heart failure: Secondary | ICD-10-CM | POA: Diagnosis not present

## 2020-03-29 DIAGNOSIS — Z87891 Personal history of nicotine dependence: Secondary | ICD-10-CM | POA: Insufficient documentation

## 2020-03-29 DIAGNOSIS — Z7901 Long term (current) use of anticoagulants: Secondary | ICD-10-CM | POA: Diagnosis not present

## 2020-03-29 DIAGNOSIS — R471 Dysarthria and anarthria: Secondary | ICD-10-CM | POA: Insufficient documentation

## 2020-03-29 DIAGNOSIS — Z794 Long term (current) use of insulin: Secondary | ICD-10-CM | POA: Diagnosis not present

## 2020-03-29 DIAGNOSIS — M1990 Unspecified osteoarthritis, unspecified site: Secondary | ICD-10-CM | POA: Diagnosis not present

## 2020-03-29 DIAGNOSIS — I671 Cerebral aneurysm, nonruptured: Secondary | ICD-10-CM | POA: Insufficient documentation

## 2020-03-29 DIAGNOSIS — J449 Chronic obstructive pulmonary disease, unspecified: Secondary | ICD-10-CM | POA: Insufficient documentation

## 2020-03-29 DIAGNOSIS — Z79899 Other long term (current) drug therapy: Secondary | ICD-10-CM | POA: Diagnosis not present

## 2020-03-29 DIAGNOSIS — F1410 Cocaine abuse, uncomplicated: Secondary | ICD-10-CM | POA: Diagnosis not present

## 2020-03-29 DIAGNOSIS — D649 Anemia, unspecified: Secondary | ICD-10-CM | POA: Insufficient documentation

## 2020-03-29 DIAGNOSIS — Z9049 Acquired absence of other specified parts of digestive tract: Secondary | ICD-10-CM | POA: Diagnosis not present

## 2020-03-29 DIAGNOSIS — I5042 Chronic combined systolic (congestive) and diastolic (congestive) heart failure: Secondary | ICD-10-CM | POA: Insufficient documentation

## 2020-03-29 DIAGNOSIS — Z8673 Personal history of transient ischemic attack (TIA), and cerebral infarction without residual deficits: Secondary | ICD-10-CM | POA: Insufficient documentation

## 2020-03-29 DIAGNOSIS — I350 Nonrheumatic aortic (valve) stenosis: Secondary | ICD-10-CM | POA: Diagnosis not present

## 2020-03-29 HISTORY — DX: Thoracic aortic aneurysm, without rupture: I71.2

## 2020-03-29 HISTORY — DX: Aphasia: R47.01

## 2020-03-29 HISTORY — DX: Aneurysm of the ascending aorta, without rupture: I71.21

## 2020-03-29 HISTORY — DX: Unspecified atrial fibrillation: I48.91

## 2020-03-29 LAB — COMPREHENSIVE METABOLIC PANEL
ALT: 9 U/L (ref 0–44)
AST: 15 U/L (ref 15–41)
Albumin: 3.5 g/dL (ref 3.5–5.0)
Alkaline Phosphatase: 64 U/L (ref 38–126)
Anion gap: 14 (ref 5–15)
BUN: 19 mg/dL (ref 6–20)
CO2: 25 mmol/L (ref 22–32)
Calcium: 7.9 mg/dL — ABNORMAL LOW (ref 8.9–10.3)
Chloride: 96 mmol/L — ABNORMAL LOW (ref 98–111)
Creatinine, Ser: 1.55 mg/dL — ABNORMAL HIGH (ref 0.44–1.00)
GFR calc Af Amer: 44 mL/min — ABNORMAL LOW (ref >60)
GFR calc non Af Amer: 38 mL/min — ABNORMAL LOW (ref >60)
Glucose, Bld: 239 mg/dL — ABNORMAL HIGH (ref 70–99)
Potassium: 3 mmol/L — ABNORMAL LOW (ref 3.5–5.1)
Sodium: 135 mmol/L (ref 135–145)
Total Bilirubin: 0.8 mg/dL (ref 0.3–1.2)
Total Protein: 7 g/dL (ref 6.5–8.1)

## 2020-03-29 LAB — CBC WITH DIFFERENTIAL/PLATELET
Abs Immature Granulocytes: 0.03 10*3/uL (ref 0.00–0.07)
Basophils Absolute: 0.1 10*3/uL (ref 0.0–0.1)
Basophils Relative: 1 %
Eosinophils Absolute: 0.3 10*3/uL (ref 0.0–0.5)
Eosinophils Relative: 3 %
HCT: 36.7 % (ref 36.0–46.0)
Hemoglobin: 12.1 g/dL (ref 12.0–15.0)
Immature Granulocytes: 0 %
Lymphocytes Relative: 27 %
Lymphs Abs: 2.5 10*3/uL (ref 0.7–4.0)
MCH: 28.3 pg (ref 26.0–34.0)
MCHC: 33 g/dL (ref 30.0–36.0)
MCV: 85.7 fL (ref 80.0–100.0)
Monocytes Absolute: 0.8 10*3/uL (ref 0.1–1.0)
Monocytes Relative: 8 %
Neutro Abs: 5.8 10*3/uL (ref 1.7–7.7)
Neutrophils Relative %: 61 %
Platelets: 258 10*3/uL (ref 150–400)
RBC: 4.28 MIL/uL (ref 3.87–5.11)
RDW: 13.5 % (ref 11.5–15.5)
WBC: 9.5 10*3/uL (ref 4.0–10.5)
nRBC: 0 % (ref 0.0–0.2)

## 2020-03-29 LAB — PLATELET INHIBITION P2Y12: Platelet Function  P2Y12: 14 [PRU] — ABNORMAL LOW (ref 182–335)

## 2020-03-29 LAB — PROTIME-INR
INR: 1.2 (ref 0.8–1.2)
Prothrombin Time: 14.5 seconds (ref 11.4–15.2)

## 2020-03-29 LAB — GLUCOSE, CAPILLARY: Glucose-Capillary: 214 mg/dL — ABNORMAL HIGH (ref 70–99)

## 2020-03-29 SURGERY — IR WITH ANESTHESIA
Anesthesia: General

## 2020-03-29 MED ORDER — SODIUM CHLORIDE 0.9 % IV SOLN: INTRAVENOUS | Status: DC

## 2020-03-29 MED ORDER — CHLORHEXIDINE GLUCONATE 0.12 % MT SOLN
15.0000 mL | Freq: Once | OROMUCOSAL | Status: AC
  Administered 2020-03-29: 15 mL via OROMUCOSAL
  Filled 2020-03-29: qty 15

## 2020-03-29 MED ORDER — ORAL CARE MOUTH RINSE: 15.0000 mL | Freq: Once | OROMUCOSAL | Status: AC

## 2020-03-29 MED ORDER — ACETAMINOPHEN 500 MG PO TABS
1000.0000 mg | ORAL_TABLET | Freq: Once | ORAL | Status: AC
  Administered 2020-03-29: 1000 mg via ORAL
  Filled 2020-03-29: qty 2

## 2020-03-29 MED ORDER — LACTATED RINGERS IV SOLN: INTRAVENOUS | Status: DC

## 2020-03-29 NOTE — Progress Notes (Signed)
Procedure canceled per Dr. Estanislado Pandy. Pt transported to main entrance via wheelchair. Pt picked up by sister ([name]).

## 2020-03-29 NOTE — Progress Notes (Addendum)
Patient ID: Robin [name], female   DOB: [birth year], 54 y.o.   MRN: [id number] INR. Patient scheduled for RT ICA intracranial aneurysm embolization with flow diverter.. Platelet inhibition test  result  of 14. Due to the increased risk of hemorrhagic complications procedure postponed . D/W sister . Plan. Stop aspirin and brilinta.  Resume eliquis as previously prescribed. Sister also informed of the hospital directed  temporary restrictions due to covid 19 on procedures requiring overnight stay in the ICU. Patient will be rescheduled when these restrictions are relaxed. Marland Kitchen S.Devehwar MD

## 2020-03-31 ENCOUNTER — Telehealth: Payer: Self-pay | Admitting: Student

## 2020-03-31 NOTE — Telephone Encounter (Signed)
Returned call to sister [name]. Explain that upcoming office visit is for pre op clearance. Sister stated that pt was to have surgery on 03/29/20 and that this could not be for the same surgery. She will follow up with surgeon's office.

## 2020-03-31 NOTE — Telephone Encounter (Signed)
Pt 's Sister/[name] would like a return call [id number] office visit scheduled for  04-19-20.   Please call [name] [phone]   thanks

## 2020-04-03 ENCOUNTER — Other Ambulatory Visit: Payer: Self-pay | Admitting: Physician Assistant

## 2020-04-03 DIAGNOSIS — E876 Hypokalemia: Secondary | ICD-10-CM

## 2020-04-03 DIAGNOSIS — R4701 Aphasia: Secondary | ICD-10-CM

## 2020-04-04 ENCOUNTER — Encounter: Payer: 59 | Attending: Registered Nurse | Admitting: Physical Medicine & Rehabilitation

## 2020-04-04 DIAGNOSIS — G8191 Hemiplegia, unspecified affecting right dominant side: Secondary | ICD-10-CM | POA: Insufficient documentation

## 2020-04-04 DIAGNOSIS — I10 Essential (primary) hypertension: Secondary | ICD-10-CM | POA: Insufficient documentation

## 2020-04-04 DIAGNOSIS — N1831 Chronic kidney disease, stage 3a: Secondary | ICD-10-CM | POA: Insufficient documentation

## 2020-04-04 DIAGNOSIS — F1410 Cocaine abuse, uncomplicated: Secondary | ICD-10-CM | POA: Insufficient documentation

## 2020-04-04 DIAGNOSIS — E1142 Type 2 diabetes mellitus with diabetic polyneuropathy: Secondary | ICD-10-CM | POA: Insufficient documentation

## 2020-04-04 DIAGNOSIS — Z794 Long term (current) use of insulin: Secondary | ICD-10-CM | POA: Insufficient documentation

## 2020-04-04 DIAGNOSIS — E1121 Type 2 diabetes mellitus with diabetic nephropathy: Secondary | ICD-10-CM | POA: Insufficient documentation

## 2020-04-04 DIAGNOSIS — I63512 Cerebral infarction due to unspecified occlusion or stenosis of left middle cerebral artery: Secondary | ICD-10-CM | POA: Insufficient documentation

## 2020-04-11 ENCOUNTER — Other Ambulatory Visit: Payer: Self-pay

## 2020-04-11 NOTE — Patient Outreach (Signed)
[facility]) Care Management  04/11/2020  Lama Narayanan [place] 09/08/1965 [id number]  Telephone outreach to patient to obtain mRS was successfully completed. MRS=3.  [place] Management Assistant [phone]

## 2020-04-19 ENCOUNTER — Ambulatory Visit (INDEPENDENT_AMBULATORY_CARE_PROVIDER_SITE_OTHER): Payer: 59 | Admitting: Student

## 2020-04-19 ENCOUNTER — Encounter: Payer: Self-pay | Admitting: Student

## 2020-04-19 ENCOUNTER — Other Ambulatory Visit: Payer: Self-pay

## 2020-04-19 VITALS — BP 110/58 | HR 70 | Ht 66.0 in | Wt 187.2 lb

## 2020-04-19 DIAGNOSIS — E785 Hyperlipidemia, unspecified: Secondary | ICD-10-CM

## 2020-04-19 DIAGNOSIS — I10 Essential (primary) hypertension: Secondary | ICD-10-CM | POA: Diagnosis not present

## 2020-04-19 DIAGNOSIS — Z01810 Encounter for preprocedural cardiovascular examination: Secondary | ICD-10-CM

## 2020-04-19 DIAGNOSIS — I5042 Chronic combined systolic (congestive) and diastolic (congestive) heart failure: Secondary | ICD-10-CM

## 2020-04-19 DIAGNOSIS — I4819 Other persistent atrial fibrillation: Secondary | ICD-10-CM

## 2020-04-19 NOTE — Patient Instructions (Addendum)
Medication Instructions:   Continue current cardiac medications.   Labwork:  None.   Testing/Procedures:  Echocardiogram to assess pumping function of the heart.   Follow-Up:  With Dr. Harl Bowie or Bernerd Pho, PA-C in 3 months.   Any Other Special Instructions Will Be Listed Below (If Applicable).     If you need a refill on your cardiac medications before your next appointment, please call your pharmacy.        Two Gram Sodium Diet 2000 mg  What is Sodium? Sodium is a mineral found naturally in many foods. The most significant source of sodium in the diet is table salt, which is about 40% sodium.  Processed, convenience, and preserved foods also contain a large amount of sodium.  The body needs only 500 mg of sodium daily to function,  A normal diet provides more than enough sodium even if you do not use salt.  Why Limit Sodium? A build up of sodium in the body can cause thirst, increased blood pressure, shortness of breath, and water retention.  Decreasing sodium in the diet can reduce edema and risk of heart attack or stroke associated with high blood pressure.  Keep in mind that there are many other factors involved in these health problems.  Heredity, obesity, lack of exercise, cigarette smoking, stress and what you eat all play a role.  General Guidelines:  Do not add salt at the table or in cooking.  One teaspoon of salt contains over 2 grams of sodium.  Read food labels  Avoid processed and convenience foods  Ask your dietitian before eating any foods not dicussed in the menu planning guidelines  Consult your physician if you wish to use a salt substitute or a sodium containing medication such as antacids.  Limit milk and milk products to 16 oz (2 cups) per day.  Shopping Hints:  READ LABELS!! "Dietetic" does not necessarily mean low sodium.  Salt and other sodium ingredients are often added to foods during processing.   Menu Planning Guidelines Food  Group Choose More Often Avoid  Beverages (see also the milk group All fruit juices, low-sodium, salt-free vegetables juices, low-sodium carbonated beverages Regular vegetable or tomato juices, commercially softened water used for drinking or cooking  Breads and Cereals Enriched white, wheat, rye and pumpernickel bread, hard rolls and dinner rolls; muffins, cornbread and waffles; most dry cereals, cooked cereal without added salt; unsalted crackers and breadsticks; low sodium or homemade bread crumbs Bread, rolls and crackers with salted tops; quick breads; instant hot cereals; pancakes; commercial bread stuffing; self-rising flower and biscuit mixes; regular bread crumbs or cracker crumbs  Desserts and Sweets Desserts and sweets mad with mild should be within allowance Instant pudding mixes and cake mixes  Fats Butter or margarine; vegetable oils; unsalted salad dressings, regular salad dressings limited to 1 Tbs; light, sour and heavy cream Regular salad dressings containing bacon fat, bacon bits, and salt pork; snack dips made with instant soup mixes or processed cheese; salted nuts  Fruits Most fresh, frozen and canned fruits Fruits processed with salt or sodium-containing ingredient (some dried fruits are processed with sodium sulfites        Vegetables Fresh, frozen vegetables and low- sodium canned vegetables Regular canned vegetables, sauerkraut, pickled vegetables, and others prepared in brine; frozen vegetables in sauces; vegetables seasoned with ham, bacon or salt pork  Condiments, Sauces, Miscellaneous  Salt substitute with physician's approval; pepper, herbs, spices; vinegar, lemon or lime juice; hot pepper sauce; garlic powder, onion  powder, low sodium soy sauce (1 Tbs.); low sodium condiments (ketchup, chili sauce, mustard) in limited amounts (1 tsp.) fresh ground horseradish; unsalted tortilla chips, pretzels, potato chips, popcorn, salsa (1/4 cup) Any seasoning made with salt including  garlic salt, celery salt, onion salt, and seasoned salt; sea salt, rock salt, kosher salt; meat tenderizers; monosodium glutamate; mustard, regular soy sauce, barbecue, sauce, chili sauce, teriyaki sauce, steak sauce, Worcestershire sauce, and most flavored vinegars; canned gravy and mixes; regular condiments; salted snack foods, olives, picles, relish, horseradish sauce, catsup   Food preparation: Try these seasonings Meats:    Pork Sage, onion Serve with applesauce  Chicken Poultry seasoning, thyme, parsley Serve with cranberry sauce  Lamb Curry powder, rosemary, garlic, thyme Serve with mint sauce or jelly  Veal Marjoram, basil Serve with current jelly, cranberry sauce  Beef Pepper, bay leaf Serve with dry mustard, unsalted chive butter  Fish [place] leaf, dill Serve with unsalted lemon butter, unsalted parsley butter  Vegetables:    Asparagus Lemon juice   Broccoli Lemon juice   Carrots Mustard dressing parsley, mint, nutmeg, glazed with unsalted butter and sugar   Green beans Marjoram, lemon juice, nutmeg,dill seed   Tomatoes Basil, marjoram, onion   Spice /blend for [employer]" 4 tsp ground thyme 1 tsp ground sage 3 tsp ground rosemary 4 tsp ground marjoram   Test your knowledge 1. A product that says "Salt Free" may still contain sodium. True or False 2. Garlic Powder and Hot Pepper Sauce an be used as alternative seasonings.True or False 3. Processed foods have more sodium than fresh foods.  True or False 4. Canned Vegetables have less sodium than froze True or False  WAYS TO DECREASE YOUR SODIUM INTAKE 1. Avoid the use of added salt in cooking and at the table.  Table salt (and other prepared seasonings which contain salt) is probably one of the greatest sources of sodium in the diet.  Unsalted foods can gain flavor from the sweet, sour, and butter taste sensations of herbs and spices.  Instead of using salt for seasoning, try the following seasonings with the foods listed.   Remember: how you use them to enhance natural food flavors is limited only by your creativity... Allspice-Meat, fish, eggs, fruit, peas, red and yellow vegetables Almond Extract-Fruit baked goods Anise Seed-Sweet breads, fruit, carrots, beets, cottage cheese, cookies (tastes like licorice) Basil-Meat, fish, eggs, vegetables, rice, vegetables salads, soups, sauces Bay Leaf-Meat, fish, stews, poultry Burnet-Salad, vegetables (cucumber-like flavor) Caraway Seed-Bread, cookies, cottage cheese, meat, vegetables, cheese, rice Cardamon-Baked goods, fruit, soups Celery Powder or seed-Salads, salad dressings, sauces, meatloaf, soup, bread.Do not use  celery salt Chervil-Meats, salads, fish, eggs, vegetables, cottage cheese (parsley-like flavor) Chili Power-Meatloaf, chicken cheese, corn, eggplant, egg dishes Chives-Salads cottage cheese, egg dishes, soups, vegetables, sauces Cilantro-Salsa, casseroles Cinnamon-Baked goods, fruit, pork, lamb, chicken, carrots Cloves-Fruit, baked goods, fish, pot roast, green beans, beets, carrots Coriander-Pastry, cookies, meat, salads, cheese (lemon-orange flavor) Cumin-Meatloaf, fish,cheese, eggs, cabbage,fruit pie (caraway flavor) [employer], fruit, eggs, fish, poultry, cottage cheese, vegetables Dill Seed-Meat, cottage cheese, poultry, vegetables, fish, salads, bread Fennel Seed-Bread, cookies, apples, pork, eggs, fish, beets, cabbage, cheese, Licorice-like flavor Garlic-(buds or powder) Salads, meat, poultry, fish, bread, butter, vegetables, potatoes.Do not  use garlic salt Ginger-Fruit, vegetables, baked goods, meat, fish, poultry Horseradish Root-Meet, vegetables, butter Lemon Juice or Extract-Vegetables, fruit, tea, baked goods, fish salads Mace-Baked goods fruit, vegetables, fish, poultry (taste like nutmeg) Maple Extract-Syrups Marjoram-Meat, chicken, fish, vegetables, breads, green salads (taste like Sage) Mint-Tea,  lamb, sherbet, vegetables,  desserts, carrots, cabbage Mustard, Dry or Seed-Cheese, eggs, meats, vegetables, poultry Nutmeg-Baked goods, fruit, chicken, eggs, vegetables, desserts Onion Powder-Meat, fish, poultry, vegetables, cheese, eggs, bread, rice salads (Do not use   Onion salt) Orange Extract-Desserts, baked goods Oregano-Pasta, eggs, cheese, onions, pork, lamb, fish, chicken, vegetables, green salads Paprika-Meat, fish, poultry, eggs, cheese, vegetables Parsley Flakes-Butter, vegetables, meat fish, poultry, eggs, bread, salads (certain forms may   Contain sodium Pepper-Meat fish, poultry, vegetables, eggs Peppermint Extract-Desserts, baked goods Poppy Seed-Eggs, bread, cheese, fruit dressings, baked goods, noodles, vegetables, cottage  [employer], poultry, meat, fish, cauliflower, turnips,eggs bread Saffron-Rice, bread, veal, chicken, fish, eggs Sage-Meat, fish, poultry, onions, eggplant, tomateos, pork, stews Savory-Eggs, salads, poultry, meat, rice, vegetables, soups, pork Tarragon-Meat, poultry, fish, eggs, butter, vegetables (licorice-like flavor)  Thyme-Meat, poultry, fish, eggs, vegetables, (clover-like flavor), sauces, soups Tumeric-Salads, butter, eggs, fish, rice, vegetables (saffron-like flavor) Vanilla Extract-Baked goods, candy Vinegar-Salads, vegetables, meat marinades Walnut Extract-baked goods, candy  2. Choose your Foods Wisely   The following is a list of foods to avoid which are high in sodium:  Meats-Avoid all smoked, canned, salt cured, dried and kosher meat and fish as well as Anchovies   [employer] meats:Bologna, Liverwurst, Pastrami Canned meat or fish  Marinated herring Caviar    Pepperoni Corned Beef   Pizza Dried chipped beef  Salami Frozen breaded fish or meat Salt pork Frankfurters or hot dogs  Sardines Gefilte fish   Sausage Ham (boiled ham, Proscuitto Smoked butt    spiced ham)   Spam      TV  Dinners Vegetables Canned vegetables (Regular) Relish Canned mushrooms  Sauerkraut Olives    Tomato juice Pickles  Bakery and Dessert Products Canned puddings  Cream pies Cheesecake   Decorated cakes Cookies  Beverages/Juices Tomato juice, regular  Gatorade   V-8 vegetable juice, regular  Breads and Cereals Biscuit mixes   Salted potato chips, corn chips, pretzels Bread stuffing mixes  Salted crackers and rolls Pancake and waffle mixes Self-rising flour  Seasonings Accent    Meat sauces Barbecue sauce  Meat tenderizer Catsup    Monosodium glutamate (MSG) Celery salt   Onion salt Chili sauce   Prepared mustard Garlic salt   Salt, seasoned salt, sea salt Gravy mixes   Soy sauce Horseradish   Steak sauce Ketchup   Tartar sauce Lite salt    Teriyaki sauce Marinade mixes   Worcestershire sauce  Others Baking powder   Cocoa and cocoa mixes Baking soda   Commercial casserole mixes Candy-caramels, chocolate  Dehydrated soups    Bars, fudge,nougats  Instant rice and pasta mixes Canned broth or soup  Maraschino cherries Cheese, aged and processed cheese and cheese spreads

## 2020-04-19 NOTE — Progress Notes (Addendum)
Cardiology Office Note    Date:  04/19/2020   ID:  Robin [name], DOB [birth year], MRN [id number]  PCP:  Inda Coke, [place]  Cardiologist: Carlyle Dolly, MD    Chief Complaint  Patient presents with  . Follow-up    Overdue Visit and Preop Clearance    History of Present Illness:    Robin [name] is a 54 y.o. female with past medical history of chronic systolic CHF (EF at 30 to 35% by echo in 08/2018 with similar results by echo in 05/2019), persistent atrial fibrillation (diagnosed in 04/2019), HTN, IDDM, COPD, thoracic aortic aneurysm, normal coronaries by catheterization in 2010, prior CVA and history of substance abuse who presents to the office today for overdue follow-up and preoperative cardiac clearance.   She had a telehealth visit with myself in 11/2019 and reported frequent palpitations but denied any chest pain or palpitations. She had previously been scheduled for a DCCV but this was postponed given her UDS was positive for cocaine. Options were reviewed with Dr. Harl Bowie and a rate-control strategy was recommended along with further titration of Coreg to 50mg  BID. She was also continued on Digoxin and Hydralazine for her cardiomyopathy as she had previously been intolerant to ACE-I and ARB.   In the interim, she was admitted to [facility] from 5/23 - 01/05/2020 for a L MCA infarct which required thrombectomy with TICI3 reperfusion. She did have petechial hemorrhage. UDS was positive for Cocaine on admission and there were concerns for noncompliance with her anticoagulation. Repeat echo showed her EF remained reduced at 35% and she was continued on Coreg and Digoxin with Bi-Dil and Torsemide 20mg  daily being added to her medication regimen. Was discharged to [facility] and was there from 6/2 - 02/02/2020.  She did follow-up with Neurology and IR in regards to right ICA paraclinoid aneurysm as well as the concern for possible left ICA and left MCA bifurcation aneurysms. Was scheduled  for intracranial aneurysm embolization. A message was sent requesting cardiac clearance and she was deemed high-risk from a cardiac perspective given her RCRI Risk of 11% risk of a major cardiac event. She presented to [facility] on 8/25 for the procedure and her platelet inhibition test was at 14 and due to the increased hemorrhagic risk, the procedure was postponed. Was recommended to stop ASA and Brilinta while resuming Eliquis.   In talking with the patient today, most history is provided by her sister given the patient's expressive aphasia. She has been participating in OT and speech therapy and continues to have residual right-sided weakness but this has improved. Has been released by PT. She reports intermittent pain along her right extremities which occur at rest or with activity.   She denies any recent chest pain or palpitations. No recent orthopnea, PND or lower extremity edema. Family members prepare her pill box each week and she reports good compliance with her current medication regimen.   Past Medical History:  Diagnosis Date  . A-fib ([place])   . Allergy   . Anemia   . Arthritis   . CHF (congestive heart failure) ([place])    a. EF at 30-35% by echo in 08/2018 b. EF at 35% by repeat echo in 05/2019  . Chronic abdominal pain   . Cocaine abuse ([place])   . COPD (chronic obstructive pulmonary disease) ([place])   . Essential hypertension, benign   . Expressive aphasia   . Expressive aphasia    post CVA  . Fatty liver   .  GERD (gastroesophageal reflux disease)   . Gout 2016  . Normal coronary arteries    3/10 - following abnormal Myoview  . Ovarian cyst   . Stroke ([place]) 12/26/2019   Right sided weakness, and expressive aphasia  . Thoracic ascending aortic aneurysm (HCC)    4 cm 10/31/19 CTA  . Type 2 diabetes mellitus ([place])    type II    Past Surgical History:  Procedure Laterality Date  . ABDOMINAL HYSTERECTOMY  09/10/2011   Procedure: HYSTERECTOMY ABDOMINAL;  Surgeon: Jonnie Kind, MD;  Location: [facility];  Service: Gynecology;  Laterality: N/A;  Abdominal hysterectomy  . CESAREAN SECTION  [id number], and 1994  . CHOLECYSTECTOMY  1995  . IR 3D INDEPENDENT WKST  03/16/2020  . IR ANGIO INTRA EXTRACRAN SEL INTERNAL CAROTID BILAT MOD SED  03/16/2020  . IR ANGIO VERTEBRAL SEL SUBCLAVIAN INNOMINATE UNI L MOD SED  03/16/2020  . IR ANGIO VERTEBRAL SEL VERTEBRAL UNI R MOD SED  03/16/2020  . IR CT HEAD LTD  12/27/2019  . IR PERCUTANEOUS ART THROMBECTOMY/INFUSION INTRACRANIAL INC DIAG ANGIO  12/27/2019  . IR US GUIDE VASC ACCESS RIGHT  12/27/2019  . IR US GUIDE VASC ACCESS RIGHT  03/16/2020  . RADIOLOGY WITH ANESTHESIA N/A 12/27/2019   Procedure: IR WITH ANESTHESIA;  Surgeon: Luanne Bras, MD;  Location: [facility];  Service: Radiology;  Laterality: N/A;  . SCAR REVISION  09/10/2011   Procedure: SCAR REVISION;  Surgeon: Jonnie Kind, MD;  Location: [facility];  Service: Gynecology;  Laterality: N/A;  Wide Excision of old Cicatrix  . TUBAL LIGATION  1994    Current Medications: Outpatient Medications Prior to Visit  Medication Sig Dispense Refill  . apixaban (ELIQUIS) 5 MG TABS tablet Take 1 tablet (5 mg total) by mouth 2 (two) times daily. 60 tablet 1  . BD PEN NEEDLE NANO U/F 32G X 4 MM MISC Inject into the skin as directed.    . carvedilol (COREG) 25 MG tablet Take 1 tablet (25 mg total) by mouth 2 (two) times daily with a meal. (Patient taking differently: Take 50 mg by mouth 2 (two) times daily with a meal. ) 60 tablet 0  . citalopram (CELEXA) 10 MG tablet Take 1 tablet by mouth once daily 30 tablet 0  . digoxin (LANOXIN) 0.125 MG tablet Take 1 tablet (0.125 mg total) by mouth daily. 30 tablet 0  . gabapentin (NEURONTIN) 100 MG capsule Take 1 capsule (100 mg total) by mouth 3 (three) times daily. 270 capsule 3  . hydrALAZINE (APRESOLINE) 25 MG tablet Take 1 tablet (25 mg total) by mouth 3 (three) times daily. 120 tablet 11  . insulin glargine (LANTUS) 100 UNIT/ML Solostar Pen  Inject 72 Units into the skin at bedtime. 15 mL 11  . isosorbide mononitrate (IMDUR) 30 MG 24 hr tablet Take 2 tablets (60 mg total) by mouth daily. 60 tablet 11  . metFORMIN (GLUCOPHAGE) 1000 MG tablet Take 1 tablet (1,000 mg total) by mouth 2 (two) times daily with a meal. 60 tablet 1  . nitroGLYCERIN (NITROSTAT) 0.4 MG SL tablet Place 1 tablet (0.4 mg total) under the tongue every 5 (five) minutes as needed for chest pain. 30 tablet 12  . rosuvastatin (CRESTOR) 20 MG tablet Take 1 tablet by mouth once daily 30 tablet 0  . tiZANidine (ZANAFLEX) 2 MG tablet Take 1 tablet (2 mg total) by mouth 2 (two) times daily as needed (muscle spasm). 30 tablet 0  . torsemide (DEMADEX) 20  MG tablet Take 20 mg by mouth 2 (two) times daily.    Marland Kitchen enoxaparin (LOVENOX) 150 MG/ML injection Inject 1 mL (150 mg total) into the skin daily for 2 days. (Patient not taking: Reported on 03/16/2020) 2 mL 0  . nystatin ointment (MYCOSTATIN) Apply to affected area 1-2 times daily (Patient not taking: Reported on 03/16/2020) 30 g 0  . acetaminophen (TYLENOL) 325 MG tablet Take 2 tablets (650 mg total) by mouth every 4 (four) hours as needed for mild pain (or temp > 37.5 C (99.5 F)).    Marland Kitchen aspirin EC 81 MG tablet Take 81 mg by mouth daily. Swallow whole.    . cephALEXin (KEFLEX) 250 MG capsule Take 1 capsule (250 mg total) by mouth 4 (four) times daily. 28 capsule 0  . Multiple Vitamin (MULTIVITAMIN WITH MINERALS) TABS tablet Take 1 tablet by mouth daily.    . ticagrelor (BRILINTA) 90 MG TABS tablet Take 90 mg by mouth 2 (two) times daily.    Marland Kitchen torsemide (DEMADEX) 10 MG tablet Take 1 tablet (10 mg total) by mouth daily. (Patient taking differently: Take 20 mg by mouth 2 (two) times daily. ) 30 tablet 0   No facility-administered medications prior to visit.     Allergies:   Bee venom, Losartan, Naproxen, Penicillins, and Lisinopril   Social History   Socioeconomic History  . Marital status: Single    Spouse name: Not on file   . Number of children: Not on file  . Years of education: Not on file  . Highest education level: Not on file  Occupational History  . Not on file  Tobacco Use  . Smoking status: Former Smoker    Years: 29.00    Types: Cigarettes    Quit date: 12/2019    Years since quitting: 0.3  . Smokeless tobacco: Never Used  Vaping Use  . Vaping Use: Never used  Substance and Sexual Activity  . Alcohol use: Not Currently    Comment: occ  . Drug use: Not Currently    Types: Cocaine, Marijuana  . Sexual activity: Not Currently    Birth control/protection: Surgical  Other Topics Concern  . Not on file  Social History Narrative   Part-time at [employer], lives in [place], [place]   3 children    Married   Social Determinants of Health   Financial Resource Strain:   . Difficulty of Paying Living Expenses: Not on file  Food Insecurity:   . Worried About Charity fundraiser in the Last Year: Not on file  . Ran Out of Food in the Last Year: Not on file  Transportation Needs:   . Lack of Transportation (Medical): Not on file  . Lack of Transportation (Non-Medical): Not on file  Physical Activity:   . Days of Exercise per Week: Not on file  . Minutes of Exercise per Session: Not on file  Stress:   . Feeling of Stress : Not on file  Social Connections:   . Frequency of Communication with Friends and Family: Not on file  . Frequency of Social Gatherings with Friends and Family: Not on file  . Attends Religious Services: Not on file  . Active Member of Clubs or Organizations: Not on file  . Attends Archivist Meetings: Not on file  . Marital Status: Not on file     Family History:  The patient's family history includes Alcohol abuse in her father, mother, and son; Cirrhosis in her mother; Diabetes type  II in her father and sister; Early death in her brother and mother.   Review of Systems:   Please see the history of present illness.     General:  No chills, fever, night  sweats or weight changes.  Cardiovascular:  No chest pain, dyspnea on exertion, edema, orthopnea, palpitations, paroxysmal nocturnal dyspnea. Dermatological: No rash, lesions/masses Respiratory: No cough, dyspnea Urologic: No hematuria, dysuria Abdominal:   No nausea, vomiting, diarrhea, bright red blood per rectum, melena, or hematemesis Neurologic:  No visual changes. Positive for right-sided weakness.   All other systems reviewed and are otherwise negative except as noted above.   Physical Exam:    VS:  BP (!) 110/58   Pulse 70   Ht 5\' 6"  (1.676 m)   Wt 187 lb 3.2 oz (84.9 kg)   LMP 08/21/2011   SpO2 96%   BMI 30.21 kg/m    General: Well developed, well nourished,female appearing in no acute distress. Head: Normocephalic, atraumatic. Neck: No carotid bruits. JVD not elevated.  Lungs: Respirations regular and unlabored, without wheezes or rales.  Heart: Irregularly irregular. No S3 or S4.  No murmur, no rubs, or gallops appreciated. Abdomen: Appears non-distended. No obvious abdominal masses. Msk:  Strength and tone appear normal for age. No obvious joint deformities or effusions. Extremities: No clubbing or cyanosis. No edema.  Distal pedal pulses are 2+ bilaterally. Neuro: Alert and oriented X 3. Right-sided weakness. Expressive aphasia.  Psych:  Responds to questions appropriately with a normal affect. Skin: No rashes or lesions noted  Wt Readings from Last 3 Encounters:  04/19/20 187 lb 3.2 oz (84.9 kg)  03/19/20 206 lb 2.1 oz (93.5 kg)  02/16/20 206 lb 3.2 oz (93.5 kg)     Studies/Labs Reviewed:   EKG:  EKG is not ordered today.  EKG from 03/19/2020 is reviewed which shows rate-controlled atrial fibrillation, HR 83 with no acute ST changes when compared to prior tracings.   Recent Labs: 05/10/2019: TSH 1.939 12/26/2019: B Natriuretic Peptide 367.0 12/28/2019: Magnesium 2.1 03/29/2020: ALT 9; BUN 19; Creatinine, Ser 1.55; Hemoglobin 12.1; Platelets 258; Potassium 3.0;  Sodium 135   Lipid Panel    Component Value Date/Time   CHOL 121 12/27/2019 0305   TRIG 128 12/27/2019 0305   HDL 27 (L) 12/27/2019 0305   CHOLHDL 4.5 12/27/2019 0305   VLDL 26 12/27/2019 0305   LDLCALC 68 12/27/2019 0305    Additional studies/ records that were reviewed today include:   Echocardiogram: 12/2019 IMPRESSIONS    1. Left ventricular ejection fraction, by estimation, is 30 to 35%. The  left ventricle has moderately decreased function. The left ventricle  demonstrates global hypokinesis. The left ventricular internal cavity size  was mildly to moderately dilated.  There is moderate asymmetric left ventricular hypertrophy. Left  ventricular diastolic function could not be evaluated.  2. Right ventricular systolic function is normal. The right ventricular  size is normal.  3. Left atrial size was severely dilated.  4. Right atrial size was severely dilated.  5. The mitral valve is grossly normal. Trivial mitral valve  regurgitation. No evidence of mitral stenosis.  6. The aortic valve is grossly normal. Aortic valve regurgitation is not  visualized. Moderate aortic valve stenosis.   Assessment:    1. Chronic combined systolic and diastolic CHF (congestive heart failure) (HCC)   2. Persistent atrial fibrillation ([place])   3. Essential hypertension   4. Hyperlipidemia LDL goal <70   5. Preoperative cardiovascular examination  Plan:   In order of problems listed above:  1. Chronic Combined Systolic and Diastolic CHF - She has a known reduced EF of 30-35% by echocardiogram in 08/2018, 05/2019 and 12/2019. Since her CVA earlier this year, she has been compliant with her medications and denies any recurrent substance use. Will plan to obtain a repeat echocardiogram to assess LV function and wall motion.  - She remains on Coreg 50mg  BID, Digoxin 0.125mg  daily, Hydralazine 25mg  TID, Imdur 60mg  daily and Torsemide 20mg  BID. If EF remains reduced, would  consider the addition of Spironolactone. She was previously intolerant to ACE-I and ARB which is why Delene Loll has not been initiated.   2. Persistent Atrial Fibrillation - She denies any recent palpitations and HR is well-controlled in the 70's. Would continue with a rate-control strategy given her severe LA dilation by recent echocardiogram. Continue Coreg and Digoxin.  - She denies any evidence of active bleeding. Remains on Eliquis 5mg  BID for anticoagulation.   3. HTN - BP is well-controlled at 110/58 during today's visit. Continue current medication regimen.   4. HLD  - Followed by PCP. She remains on Crestor 20mg  daily with goal LDL less than 70 with prior CVA. LDL at 68 in 12/2019.  5. Preoperative Cardiac Clearance for Intracranial aneurysm embolization - As previously mentioned, she is at high-risk from a cardiac perspective given her RCRI Risk of 11% risk of a major cardiac event. Will plan to obtain a repeat echocardiogram as outlined above for further evaluation of her EF.  - The procedure was postponed last month given her platelet inhibition test was at 14 and ASA and Brilinta were discontinued. Her sister questions if and when this will be rescheduled, therefore I will send a note to IR to follow-up on this.   ADDENDUM: Reached out to IR who confirmed embolizations are currently on hold as they require an ICU bed for overnight observation. They will reach out to the patient once these procedures are being performed. I called the patient's sister to make her aware and she was thankful for the update.    Medication Adjustments/Labs and Tests Ordered: Current medicines are reviewed at length with the patient today.  Concerns regarding medicines are outlined above.  Medication changes, Labs and Tests ordered today are listed in the Patient Instructions below. Patient Instructions   Medication Instructions:   Continue current cardiac medications.   Labwork:  None.    Testing/Procedures:  Echocardiogram to assess pumping function of the heart.   Follow-Up:  With Dr. Harl Bowie or Bernerd Pho, PA-C in 3 months.   Any Other Special Instructions Will Be Listed Below (If Applicable).     If you need a refill on your cardiac medications before your next appointment, please call your pharmacy.        Two Gram Sodium Diet 2000 mg  What is Sodium? Sodium is a mineral found naturally in many foods. The most significant source of sodium in the diet is table salt, which is about 40% sodium.  Processed, convenience, and preserved foods also contain a large amount of sodium.  The body needs only 500 mg of sodium daily to function,  A normal diet provides more than enough sodium even if you do not use salt.  Why Limit Sodium? A build up of sodium in the body can cause thirst, increased blood pressure, shortness of breath, and water retention.  Decreasing sodium in the diet can reduce edema and risk of heart attack or stroke associated  with high blood pressure.  Keep in mind that there are many other factors involved in these health problems.  Heredity, obesity, lack of exercise, cigarette smoking, stress and what you eat all play a role.  General Guidelines:  Do not add salt at the table or in cooking.  One teaspoon of salt contains over 2 grams of sodium.  Read food labels  Avoid processed and convenience foods  Ask your dietitian before eating any foods not dicussed in the menu planning guidelines  Consult your physician if you wish to use a salt substitute or a sodium containing medication such as antacids.  Limit milk and milk products to 16 oz (2 cups) per day.  Shopping Hints:  READ LABELS!! "Dietetic" does not necessarily mean low sodium.  Salt and other sodium ingredients are often added to foods during processing.   Menu Planning Guidelines Food Group Choose More Often Avoid  Beverages (see also the milk group All fruit juices,  low-sodium, salt-free vegetables juices, low-sodium carbonated beverages Regular vegetable or tomato juices, commercially softened water used for drinking or cooking  Breads and Cereals Enriched white, wheat, rye and pumpernickel bread, hard rolls and dinner rolls; muffins, cornbread and waffles; most dry cereals, cooked cereal without added salt; unsalted crackers and breadsticks; low sodium or homemade bread crumbs Bread, rolls and crackers with salted tops; quick breads; instant hot cereals; pancakes; commercial bread stuffing; self-rising flower and biscuit mixes; regular bread crumbs or cracker crumbs  Desserts and Sweets Desserts and sweets mad with mild should be within allowance Instant pudding mixes and cake mixes  Fats Butter or margarine; vegetable oils; unsalted salad dressings, regular salad dressings limited to 1 Tbs; light, sour and heavy cream Regular salad dressings containing bacon fat, bacon bits, and salt pork; snack dips made with instant soup mixes or processed cheese; salted nuts  Fruits Most fresh, frozen and canned fruits Fruits processed with salt or sodium-containing ingredient (some dried fruits are processed with sodium sulfites        Vegetables Fresh, frozen vegetables and low- sodium canned vegetables Regular canned vegetables, sauerkraut, pickled vegetables, and others prepared in brine; frozen vegetables in sauces; vegetables seasoned with ham, bacon or salt pork  Condiments, Sauces, Miscellaneous  Salt substitute with physician's approval; pepper, herbs, spices; vinegar, lemon or lime juice; hot pepper sauce; garlic powder, onion powder, low sodium soy sauce (1 Tbs.); low sodium condiments (ketchup, chili sauce, mustard) in limited amounts (1 tsp.) fresh ground horseradish; unsalted tortilla chips, pretzels, potato chips, popcorn, salsa (1/4 cup) Any seasoning made with salt including garlic salt, celery salt, onion salt, and seasoned salt; sea salt, rock salt, kosher  salt; meat tenderizers; monosodium glutamate; mustard, regular soy sauce, barbecue, sauce, chili sauce, teriyaki sauce, steak sauce, Worcestershire sauce, and most flavored vinegars; canned gravy and mixes; regular condiments; salted snack foods, olives, picles, relish, horseradish sauce, catsup   Food preparation: Try these seasonings Meats:    Pork Sage, onion Serve with applesauce  Chicken Poultry seasoning, thyme, parsley Serve with cranberry sauce  Lamb Curry powder, rosemary, garlic, thyme Serve with mint sauce or jelly  Veal Marjoram, basil Serve with current jelly, cranberry sauce  Beef Pepper, bay leaf Serve with dry mustard, unsalted chive butter  Fish Bay leaf, dill Serve with unsalted lemon butter, unsalted parsley butter  Vegetables:    Asparagus Lemon juice   Broccoli Lemon juice   Carrots Mustard dressing parsley, mint, nutmeg, glazed with unsalted butter and sugar   Green beans  Marjoram, lemon juice, nutmeg,dill seed   Tomatoes Basil, marjoram, onion   Spice /blend for [employer]" 4 tsp ground thyme 1 tsp ground sage 3 tsp ground rosemary 4 tsp ground marjoram   Test your knowledge 1. A product that says "Salt Free" may still contain sodium. True or False 2. Garlic Powder and Hot Pepper Sauce an be used as alternative seasonings.True or False 3. Processed foods have more sodium than fresh foods.  True or False 4. Canned Vegetables have less sodium than froze True or False  WAYS TO DECREASE YOUR SODIUM INTAKE 1. Avoid the use of added salt in cooking and at the table.  Table salt (and other prepared seasonings which contain salt) is probably one of the greatest sources of sodium in the diet.  Unsalted foods can gain flavor from the sweet, sour, and butter taste sensations of herbs and spices.  Instead of using salt for seasoning, try the following seasonings with the foods listed.  Remember: how you use them to enhance natural food flavors is limited only by your  creativity... Allspice-Meat, fish, eggs, fruit, peas, red and yellow vegetables Almond Extract-Fruit baked goods Anise Seed-Sweet breads, fruit, carrots, beets, cottage cheese, cookies (tastes like licorice) Basil-Meat, fish, eggs, vegetables, rice, vegetables salads, soups, sauces Bay Leaf-Meat, fish, stews, poultry Burnet-Salad, vegetables (cucumber-like flavor) Caraway Seed-Bread, cookies, cottage cheese, meat, vegetables, cheese, rice Cardamon-Baked goods, fruit, soups Celery Powder or seed-Salads, salad dressings, sauces, meatloaf, soup, bread.Do not use  celery salt Chervil-Meats, salads, fish, eggs, vegetables, cottage cheese (parsley-like flavor) Chili Power-Meatloaf, chicken cheese, corn, eggplant, egg dishes Chives-Salads cottage cheese, egg dishes, soups, vegetables, sauces Cilantro-Salsa, casseroles Cinnamon-Baked goods, fruit, pork, lamb, chicken, carrots Cloves-Fruit, baked goods, fish, pot roast, green beans, beets, carrots Coriander-Pastry, cookies, meat, salads, cheese (lemon-orange flavor) Cumin-Meatloaf, fish,cheese, eggs, cabbage,fruit pie (caraway flavor) [employer], fruit, eggs, fish, poultry, cottage cheese, vegetables Dill Seed-Meat, cottage cheese, poultry, vegetables, fish, salads, bread Fennel Seed-Bread, cookies, apples, pork, eggs, fish, beets, cabbage, cheese, Licorice-like flavor Garlic-(buds or powder) Salads, meat, poultry, fish, bread, butter, vegetables, potatoes.Do not  use garlic salt Ginger-Fruit, vegetables, baked goods, meat, fish, poultry Horseradish Root-Meet, vegetables, butter Lemon Juice or Extract-Vegetables, fruit, tea, baked goods, fish salads Mace-Baked goods fruit, vegetables, fish, poultry (taste like nutmeg) Maple Extract-Syrups Marjoram-Meat, chicken, fish, vegetables, breads, green salads (taste like Sage) Mint-Tea, lamb, sherbet, vegetables, desserts, carrots, cabbage Mustard, Dry or Seed-Cheese, eggs, meats, vegetables,  poultry Nutmeg-Baked goods, fruit, chicken, eggs, vegetables, desserts Onion Powder-Meat, fish, poultry, vegetables, cheese, eggs, bread, rice salads (Do not use   Onion salt) Orange Extract-Desserts, baked goods Oregano-Pasta, eggs, cheese, onions, pork, lamb, fish, chicken, vegetables, green salads Paprika-Meat, fish, poultry, eggs, cheese, vegetables Parsley Flakes-Butter, vegetables, meat fish, poultry, eggs, bread, salads (certain forms may   Contain sodium Pepper-Meat fish, poultry, vegetables, eggs Peppermint Extract-Desserts, baked goods Poppy Seed-Eggs, bread, cheese, fruit dressings, baked goods, noodles, vegetables, cottage  [employer], poultry, meat, fish, cauliflower, turnips,eggs bread Saffron-Rice, bread, veal, chicken, fish, eggs Sage-Meat, fish, poultry, onions, eggplant, tomateos, pork, stews Savory-Eggs, salads, poultry, meat, rice, vegetables, soups, pork Tarragon-Meat, poultry, fish, eggs, butter, vegetables (licorice-like flavor)  Thyme-Meat, poultry, fish, eggs, vegetables, (clover-like flavor), sauces, soups Tumeric-Salads, butter, eggs, fish, rice, vegetables (saffron-like flavor) Vanilla Extract-Baked goods, candy Vinegar-Salads, vegetables, meat marinades Walnut Extract-baked goods, candy  2. Choose your Foods Wisely   The following is a list of foods to avoid which are high in sodium:  Meats-Avoid all smoked, canned, salt cured, dried  and kosher meat and fish as well as Anchovies   [employer] meats:Bologna, Liverwurst, Pastrami Canned meat or fish  Marinated herring Caviar    Pepperoni Corned Beef   Pizza Dried chipped beef  Salami Frozen breaded fish or meat Salt pork Frankfurters or hot dogs  Sardines Gefilte fish   Sausage Ham (boiled ham, Proscuitto Smoked butt    spiced ham)   Spam      TV Dinners Vegetables Canned vegetables (Regular) Relish Canned mushrooms  Sauerkraut Olives    Tomato  juice Pickles  Bakery and Dessert Products Canned puddings  Cream pies Cheesecake   Decorated cakes Cookies  Beverages/Juices Tomato juice, regular  Gatorade   V-8 vegetable juice, regular  Breads and Cereals Biscuit mixes   Salted potato chips, corn chips, pretzels Bread stuffing mixes  Salted crackers and rolls Pancake and waffle mixes Self-rising flour  Seasonings Accent    Meat sauces Barbecue sauce  Meat tenderizer Catsup    Monosodium glutamate (MSG) Celery salt   Onion salt Chili sauce   Prepared mustard Garlic salt   Salt, seasoned salt, sea salt Gravy mixes   Soy sauce Horseradish   Steak sauce Ketchup   Tartar sauce Lite salt    Teriyaki sauce Marinade mixes   Worcestershire sauce  Others Baking powder   Cocoa and cocoa mixes Baking soda   Commercial casserole mixes Candy-caramels, chocolate  Dehydrated soups    Bars, fudge,nougats  Instant rice and pasta mixes Canned broth or soup  Maraschino cherries Cheese, aged and processed cheese and cheese spreads       Signed, [name], PA-C  04/19/2020 7:52 PM     [facility] [address] [place], [place] [address] Phone: [phone] Fax: [phone]

## 2020-04-26 ENCOUNTER — Ambulatory Visit (HOSPITAL_COMMUNITY)
Admission: RE | Admit: 2020-04-26 | Discharge: 2020-04-26 | Disposition: A | Discharge status: Home / Self Care | Payer: 59 | Source: Ambulatory Visit | Attending: Student | Admitting: Student

## 2020-04-26 ENCOUNTER — Other Ambulatory Visit: Payer: Self-pay

## 2020-04-26 DIAGNOSIS — I5042 Chronic combined systolic (congestive) and diastolic (congestive) heart failure: Secondary | ICD-10-CM | POA: Insufficient documentation

## 2020-04-26 LAB — ECHOCARDIOGRAM COMPLETE
AR max vel: 1.06 cm2
AV Area VTI: 1.31 cm2
AV Area mean vel: 1.24 cm2
AV Mean grad: 11 mmHg
AV Peak grad: 22.9 mmHg
Ao pk vel: 2.4 m/s
Area-P 1/2: 2.38 cm2
S' Lateral: 2.17 cm

## 2020-04-26 NOTE — Progress Notes (Signed)
*  PRELIMINARY RESULTS* Echocardiogram 2D Echocardiogram has been performed.  Robin [name] 04/26/2020, 1:52 PM

## 2020-05-02 ENCOUNTER — Telehealth: Payer: Self-pay | Admitting: *Deleted

## 2020-05-02 ENCOUNTER — Other Ambulatory Visit: Payer: Self-pay | Admitting: Physician Assistant

## 2020-05-02 DIAGNOSIS — E876 Hypokalemia: Secondary | ICD-10-CM

## 2020-05-02 DIAGNOSIS — E1143 Type 2 diabetes mellitus with diabetic autonomic (poly)neuropathy: Secondary | ICD-10-CM

## 2020-05-02 DIAGNOSIS — R4701 Aphasia: Secondary | ICD-10-CM

## 2020-05-02 NOTE — Telephone Encounter (Signed)
Robin [name] ST with [facility] called to report (required) a missed visit today for ST.  Patient declined because she was too tired.

## 2020-05-11 ENCOUNTER — Emergency Department (HOSPITAL_COMMUNITY): Payer: 59

## 2020-05-11 ENCOUNTER — Ambulatory Visit: Payer: 59 | Admitting: Neurology

## 2020-05-11 ENCOUNTER — Other Ambulatory Visit: Payer: Self-pay

## 2020-05-11 ENCOUNTER — Encounter (HOSPITAL_COMMUNITY): Payer: Self-pay | Admitting: Emergency Medicine

## 2020-05-11 ENCOUNTER — Inpatient Hospital Stay (HOSPITAL_COMMUNITY)
Admission: EM | Admit: 2020-05-11 | Discharge: 2020-05-18 | DRG: 689 | Disposition: A | Discharge status: Skilled Nursing Facility | Payer: 59 | Attending: Internal Medicine | Admitting: Internal Medicine

## 2020-05-11 DIAGNOSIS — R001 Bradycardia, unspecified: Secondary | ICD-10-CM | POA: Diagnosis present

## 2020-05-11 DIAGNOSIS — R4701 Aphasia: Secondary | ICD-10-CM | POA: Diagnosis present

## 2020-05-11 DIAGNOSIS — I110 Hypertensive heart disease with heart failure: Secondary | ICD-10-CM | POA: Diagnosis present

## 2020-05-11 DIAGNOSIS — Z79899 Other long term (current) drug therapy: Secondary | ICD-10-CM

## 2020-05-11 DIAGNOSIS — M109 Gout, unspecified: Secondary | ICD-10-CM | POA: Diagnosis present

## 2020-05-11 DIAGNOSIS — Z8673 Personal history of transient ischemic attack (TIA), and cerebral infarction without residual deficits: Secondary | ICD-10-CM

## 2020-05-11 DIAGNOSIS — Z91030 Bee allergy status: Secondary | ICD-10-CM

## 2020-05-11 DIAGNOSIS — M25511 Pain in right shoulder: Secondary | ICD-10-CM | POA: Diagnosis present

## 2020-05-11 DIAGNOSIS — I5021 Acute systolic (congestive) heart failure: Secondary | ICD-10-CM

## 2020-05-11 DIAGNOSIS — F1410 Cocaine abuse, uncomplicated: Secondary | ICD-10-CM | POA: Diagnosis present

## 2020-05-11 DIAGNOSIS — R29710 NIHSS score 10: Secondary | ICD-10-CM | POA: Diagnosis present

## 2020-05-11 DIAGNOSIS — G928 Other toxic encephalopathy: Secondary | ICD-10-CM | POA: Diagnosis present

## 2020-05-11 DIAGNOSIS — G8191 Hemiplegia, unspecified affecting right dominant side: Secondary | ICD-10-CM

## 2020-05-11 DIAGNOSIS — R4781 Slurred speech: Secondary | ICD-10-CM | POA: Diagnosis present

## 2020-05-11 DIAGNOSIS — K219 Gastro-esophageal reflux disease without esophagitis: Secondary | ICD-10-CM | POA: Diagnosis present

## 2020-05-11 DIAGNOSIS — N3000 Acute cystitis without hematuria: Secondary | ICD-10-CM

## 2020-05-11 DIAGNOSIS — N179 Acute kidney failure, unspecified: Secondary | ICD-10-CM | POA: Diagnosis present

## 2020-05-11 DIAGNOSIS — Z7982 Long term (current) use of aspirin: Secondary | ICD-10-CM

## 2020-05-11 DIAGNOSIS — I712 Thoracic aortic aneurysm, without rupture: Secondary | ICD-10-CM | POA: Diagnosis present

## 2020-05-11 DIAGNOSIS — Z20822 Contact with and (suspected) exposure to covid-19: Secondary | ICD-10-CM | POA: Diagnosis present

## 2020-05-11 DIAGNOSIS — I5022 Chronic systolic (congestive) heart failure: Secondary | ICD-10-CM | POA: Diagnosis present

## 2020-05-11 DIAGNOSIS — Z888 Allergy status to other drugs, medicaments and biological substances status: Secondary | ICD-10-CM | POA: Diagnosis not present

## 2020-05-11 DIAGNOSIS — N390 Urinary tract infection, site not specified: Principal | ICD-10-CM

## 2020-05-11 DIAGNOSIS — R630 Anorexia: Secondary | ICD-10-CM

## 2020-05-11 DIAGNOSIS — Z794 Long term (current) use of insulin: Secondary | ICD-10-CM

## 2020-05-11 DIAGNOSIS — Z8679 Personal history of other diseases of the circulatory system: Secondary | ICD-10-CM | POA: Diagnosis not present

## 2020-05-11 DIAGNOSIS — Z880 Allergy status to penicillin: Secondary | ICD-10-CM | POA: Diagnosis not present

## 2020-05-11 DIAGNOSIS — I482 Chronic atrial fibrillation, unspecified: Secondary | ICD-10-CM | POA: Diagnosis not present

## 2020-05-11 DIAGNOSIS — I69328 Other speech and language deficits following cerebral infarction: Secondary | ICD-10-CM | POA: Diagnosis not present

## 2020-05-11 DIAGNOSIS — E663 Overweight: Secondary | ICD-10-CM | POA: Diagnosis present

## 2020-05-11 DIAGNOSIS — Z833 Family history of diabetes mellitus: Secondary | ICD-10-CM

## 2020-05-11 DIAGNOSIS — E876 Hypokalemia: Secondary | ICD-10-CM | POA: Diagnosis present

## 2020-05-11 DIAGNOSIS — R4182 Altered mental status, unspecified: Secondary | ICD-10-CM | POA: Diagnosis present

## 2020-05-11 DIAGNOSIS — Z882 Allergy status to sulfonamides status: Secondary | ICD-10-CM | POA: Diagnosis not present

## 2020-05-11 DIAGNOSIS — J449 Chronic obstructive pulmonary disease, unspecified: Secondary | ICD-10-CM | POA: Diagnosis present

## 2020-05-11 DIAGNOSIS — E119 Type 2 diabetes mellitus without complications: Secondary | ICD-10-CM | POA: Diagnosis present

## 2020-05-11 DIAGNOSIS — Z811 Family history of alcohol abuse and dependence: Secondary | ICD-10-CM

## 2020-05-11 DIAGNOSIS — I10 Essential (primary) hypertension: Secondary | ICD-10-CM | POA: Diagnosis not present

## 2020-05-11 DIAGNOSIS — I6930 Unspecified sequelae of cerebral infarction: Secondary | ICD-10-CM

## 2020-05-11 DIAGNOSIS — I340 Nonrheumatic mitral (valve) insufficiency: Secondary | ICD-10-CM | POA: Diagnosis not present

## 2020-05-11 DIAGNOSIS — I69351 Hemiplegia and hemiparesis following cerebral infarction affecting right dominant side: Secondary | ICD-10-CM | POA: Diagnosis not present

## 2020-05-11 DIAGNOSIS — Z7901 Long term (current) use of anticoagulants: Secondary | ICD-10-CM

## 2020-05-11 DIAGNOSIS — R9431 Abnormal electrocardiogram [ECG] [EKG]: Secondary | ICD-10-CM

## 2020-05-11 DIAGNOSIS — G9389 Other specified disorders of brain: Secondary | ICD-10-CM | POA: Diagnosis present

## 2020-05-11 DIAGNOSIS — I671 Cerebral aneurysm, nonruptured: Secondary | ICD-10-CM | POA: Diagnosis present

## 2020-05-11 DIAGNOSIS — D649 Anemia, unspecified: Secondary | ICD-10-CM | POA: Diagnosis present

## 2020-05-11 DIAGNOSIS — R4189 Other symptoms and signs involving cognitive functions and awareness: Secondary | ICD-10-CM | POA: Diagnosis present

## 2020-05-11 DIAGNOSIS — Z6831 Body mass index (BMI) 31.0-31.9, adult: Secondary | ICD-10-CM

## 2020-05-11 DIAGNOSIS — Z87891 Personal history of nicotine dependence: Secondary | ICD-10-CM

## 2020-05-11 LAB — CBC
HCT: 34.8 % — ABNORMAL LOW (ref 36.0–46.0)
Hemoglobin: 11.6 g/dL — ABNORMAL LOW (ref 12.0–15.0)
MCH: 28.1 pg (ref 26.0–34.0)
MCHC: 33.3 g/dL (ref 30.0–36.0)
MCV: 84.3 fL (ref 80.0–100.0)
Platelets: 333 10*3/uL (ref 150–400)
RBC: 4.13 MIL/uL (ref 3.87–5.11)
RDW: 13.9 % (ref 11.5–15.5)
WBC: 10.8 10*3/uL — ABNORMAL HIGH (ref 4.0–10.5)
nRBC: 0 % (ref 0.0–0.2)

## 2020-05-11 LAB — URINALYSIS, ROUTINE W REFLEX MICROSCOPIC
Bilirubin Urine: NEGATIVE
Glucose, UA: NEGATIVE mg/dL
Hgb urine dipstick: NEGATIVE
Ketones, ur: NEGATIVE mg/dL
Nitrite: NEGATIVE
Protein, ur: 30 mg/dL — AB
Specific Gravity, Urine: 1.016 (ref 1.005–1.030)
WBC, UA: >50 WBC/hpf — ABNORMAL HIGH (ref 0–5)
pH: 5 (ref 5.0–8.0)

## 2020-05-11 LAB — BASIC METABOLIC PANEL
Anion gap: 15 (ref 5–15)
BUN: 21 mg/dL — ABNORMAL HIGH (ref 6–20)
CO2: 23 mmol/L (ref 22–32)
Calcium: 8.7 mg/dL — ABNORMAL LOW (ref 8.9–10.3)
Chloride: 96 mmol/L — ABNORMAL LOW (ref 98–111)
Creatinine, Ser: 2.78 mg/dL — ABNORMAL HIGH (ref 0.44–1.00)
GFR calc non Af Amer: 19 mL/min — ABNORMAL LOW (ref >60)
Glucose, Bld: 123 mg/dL — ABNORMAL HIGH (ref 70–99)
Potassium: 2.9 mmol/L — ABNORMAL LOW (ref 3.5–5.1)
Sodium: 134 mmol/L — ABNORMAL LOW (ref 135–145)

## 2020-05-11 LAB — CBG MONITORING, ED: Glucose-Capillary: 138 mg/dL — ABNORMAL HIGH (ref 70–99)

## 2020-05-11 IMAGING — CT CT HEAD W/O CM
3 series · 16 of 47 positions shown, 19 images · non-contrast
Comparison: [DATE]

CLINICAL DATA: Right arm deficit

EXAM:
CT HEAD WITHOUT CONTRAST
TECHNIQUE: Contiguous axial images were obtained from the base of the skull
through the vertex without intravenous contrast.

[Series 2: head w o · axial · 0.42mm/px · z∈[+58,+183]mm · 10 of 30 slices shown, 13 images]
[im 3/30  brain]
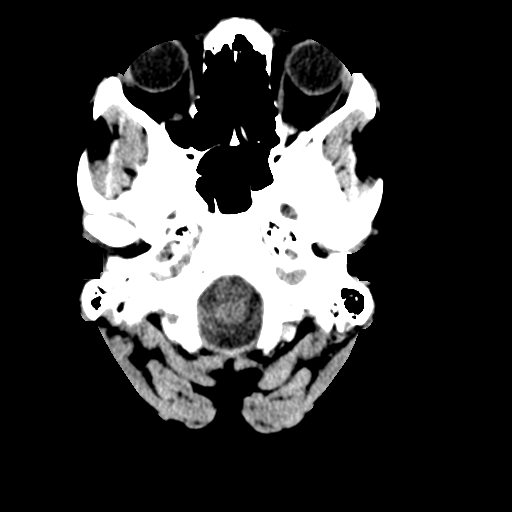
[im 3/30  bone]
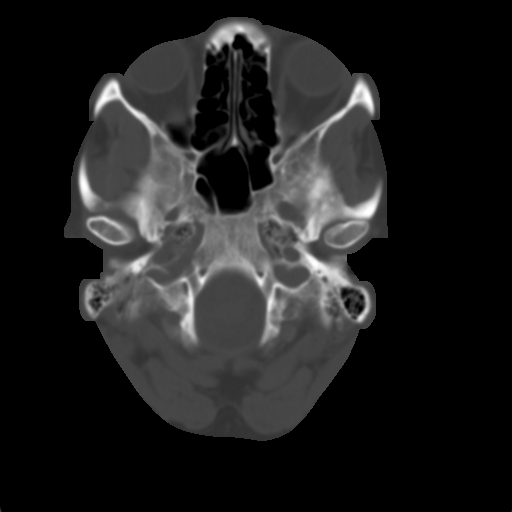
[im 6/30  brain]
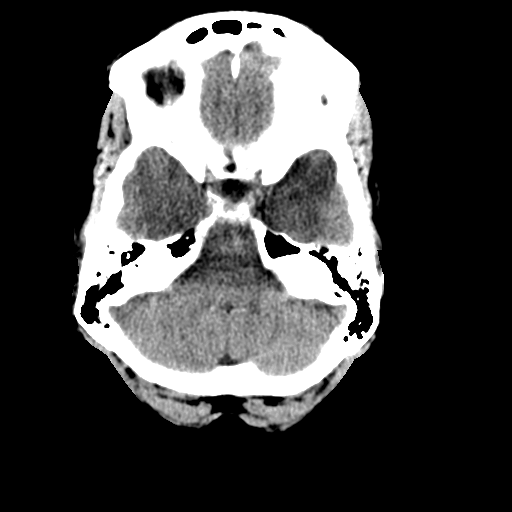
[im 9/30  brain]
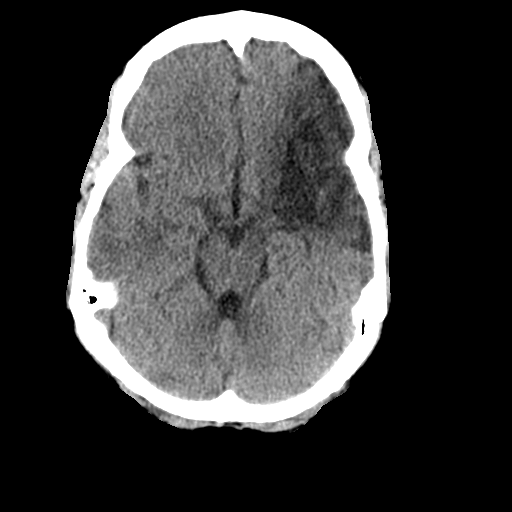
[im 11/30  brain]
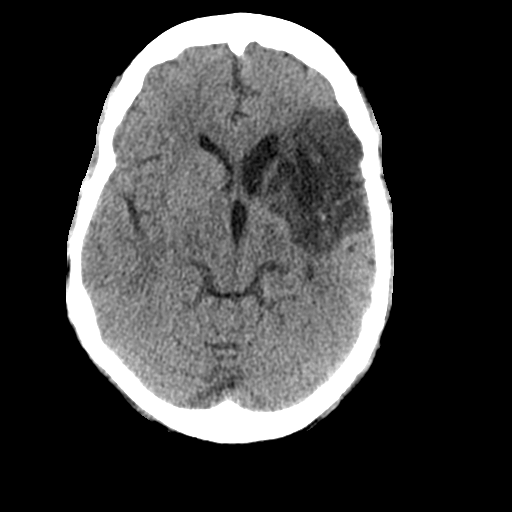
[im 14/30  brain]
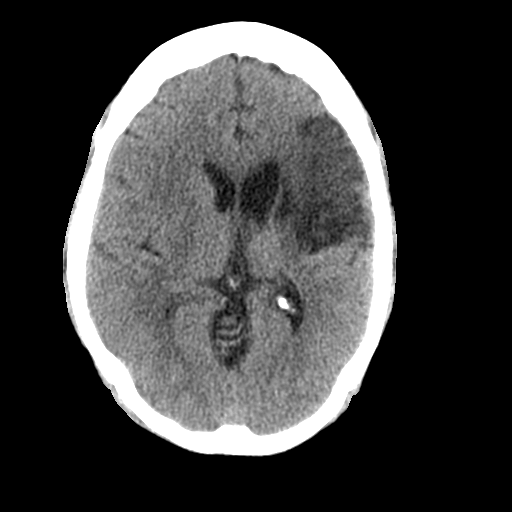
[im 14/30  bone]
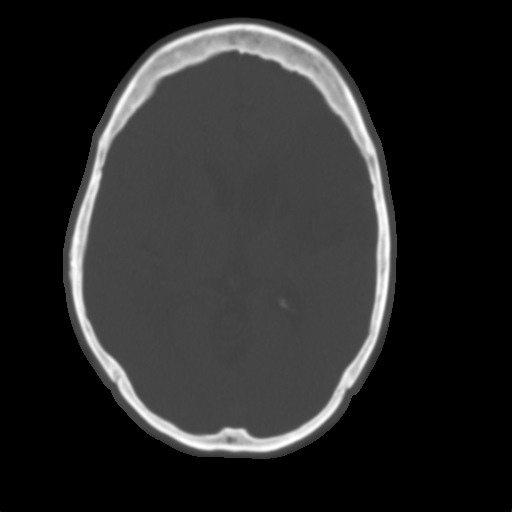
[im 17/30  brain]
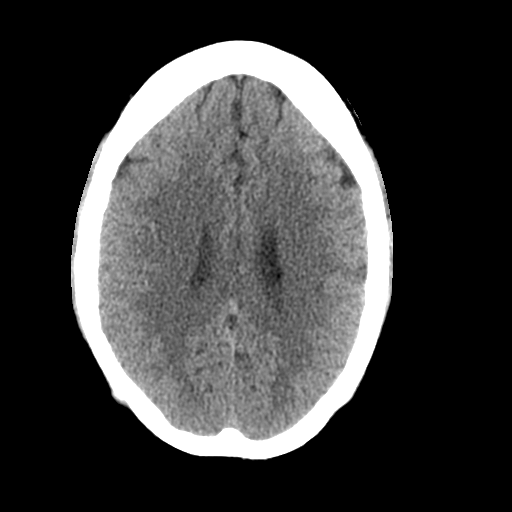
[im 20/30  brain]
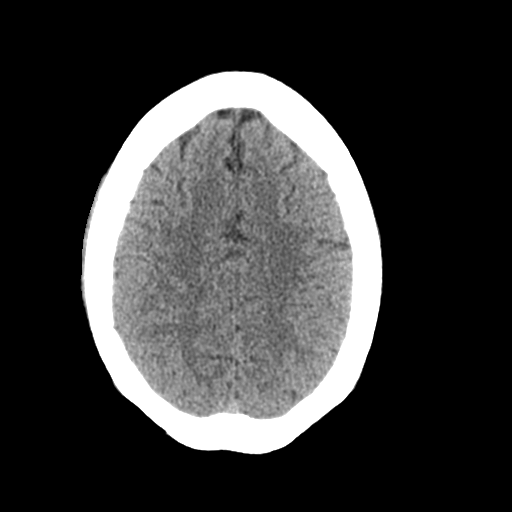
[im 23/30  brain]
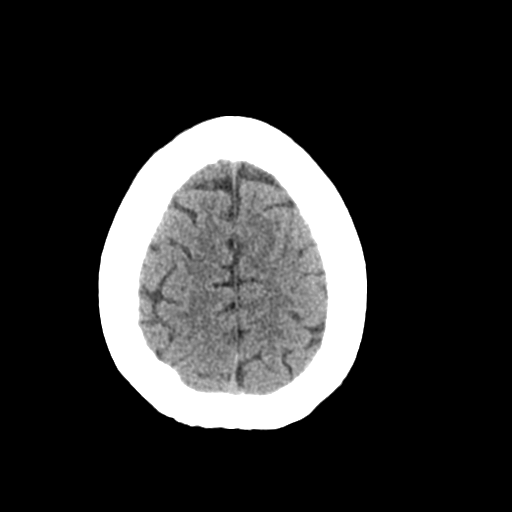
[im 25/30  brain]
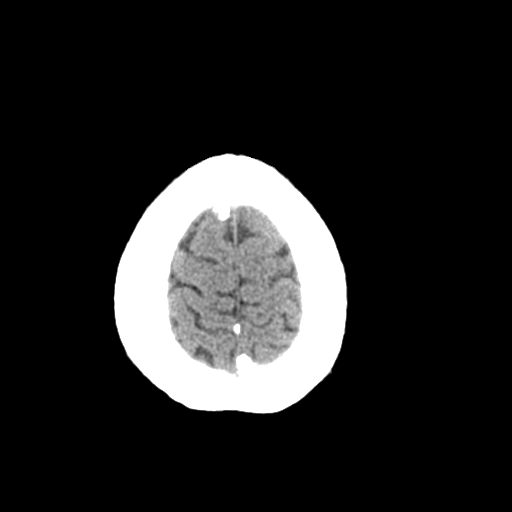
[im 25/30  bone]
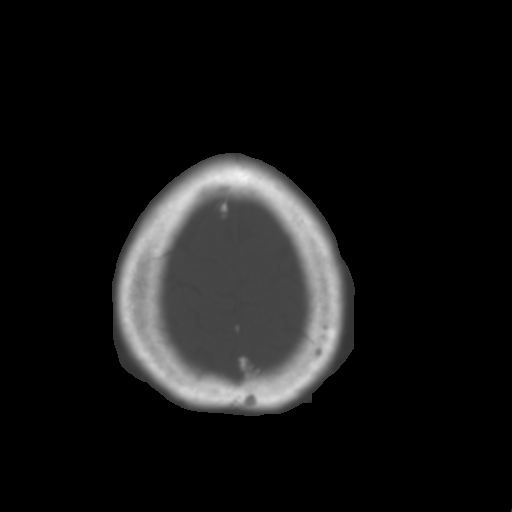
[im 28/30  brain]
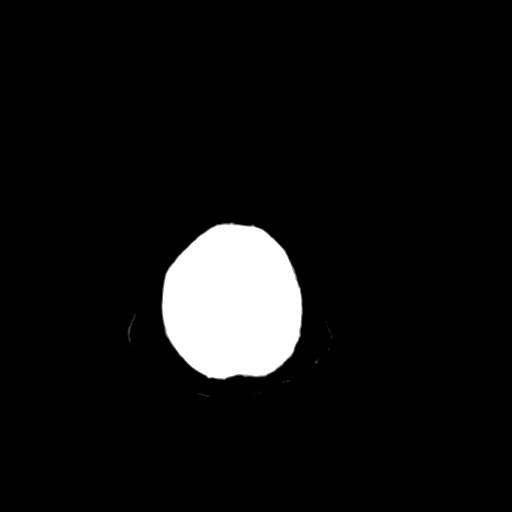

[Series 4: coronal soft · coronal · 0.31mm/px · 3 of 65 slices shown]
[im 22/65  brain]
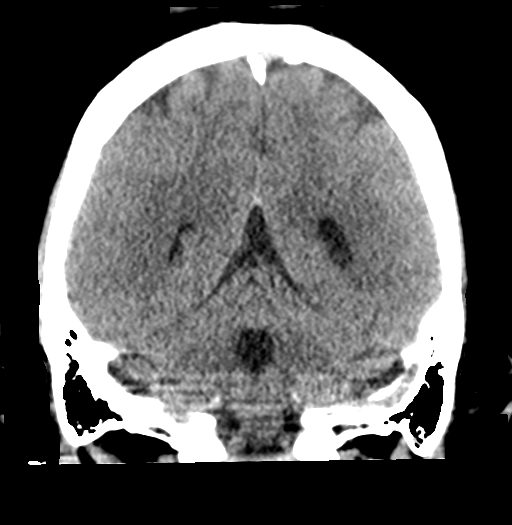
[im 29/65  brain]
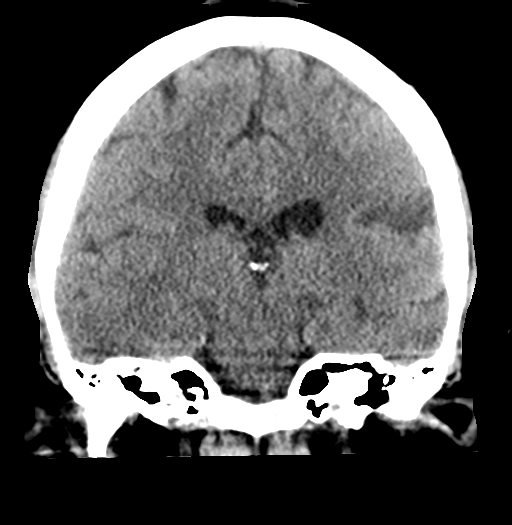
[im 36/65  brain]
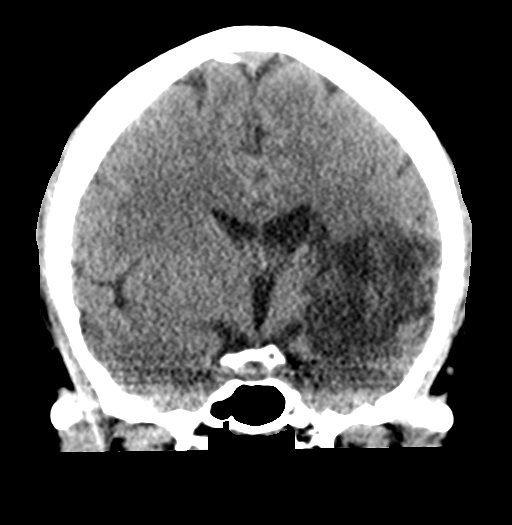

[Series 5: sagittal soft · sagittal · 0.32mm/px · 3 of 54 slices shown]
[im 18/54  brain]
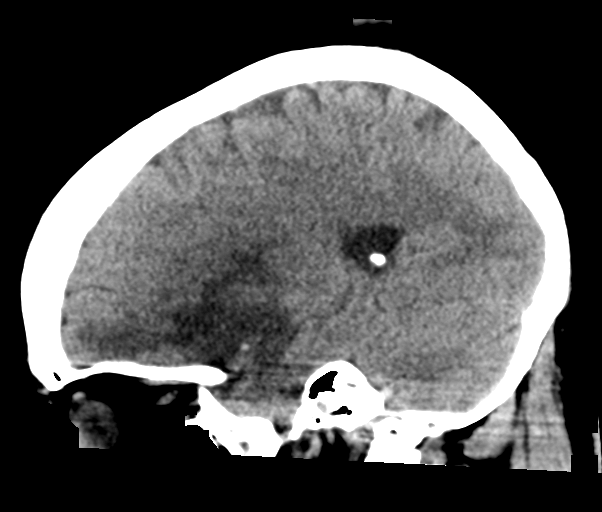
[im 27/54  brain]
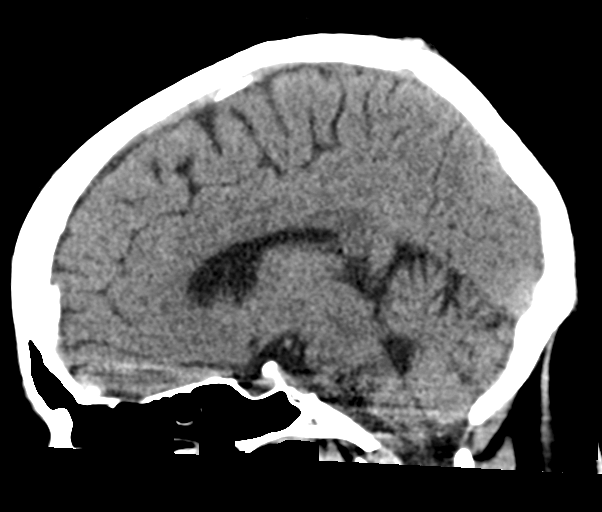
[im 36/54  brain]
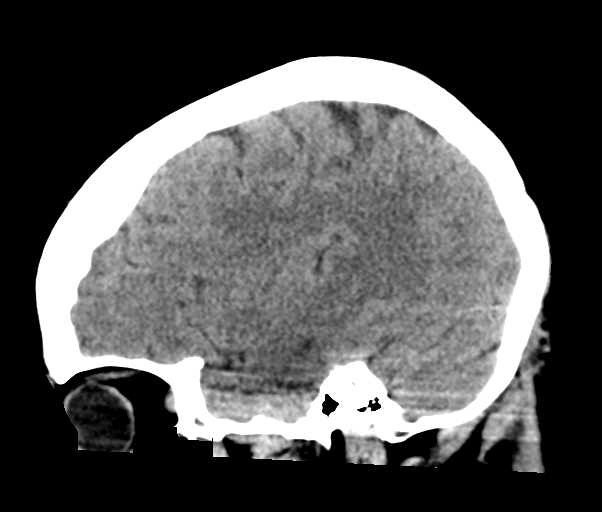

[16 of 47 positions shown; findings below may reference images not displayed]

FINDINGS: Brain: Large area of encephalomalacia involving the left
frontotemporal lobe as on prior exam. There is ex vacuo dilatation
of the anterior horn of the left lateral ventricle. No acute loss of
gray-white differentiation or extra-axial collections are seen.
There is mild low-attenuation changes in the deep white matter
consistent with small vessel ischemia. Ventricles are normal in size
and contour.

Vascular: No hyperdense vessel or unexpected calcification.

Skull: The skull is intact. No fracture or focal lesion identified.

Sinuses/Orbits: The visualized paranasal sinuses and mastoid air
cells are clear. The orbits and globes intact.

Other: None
IMPRESSION: No acute intracranial abnormality.

Stable area of encephalomalacia involving the left frontotemporal
lobe.

## 2020-05-11 IMAGING — DX DG CHEST 1V PORT
1 series · 1 of 1 positions shown · non-contrast
Comparison: [DATE]

CLINICAL DATA: Change in mental status

EXAM:
PORTABLE CHEST 1 VIEW

[chest ap]
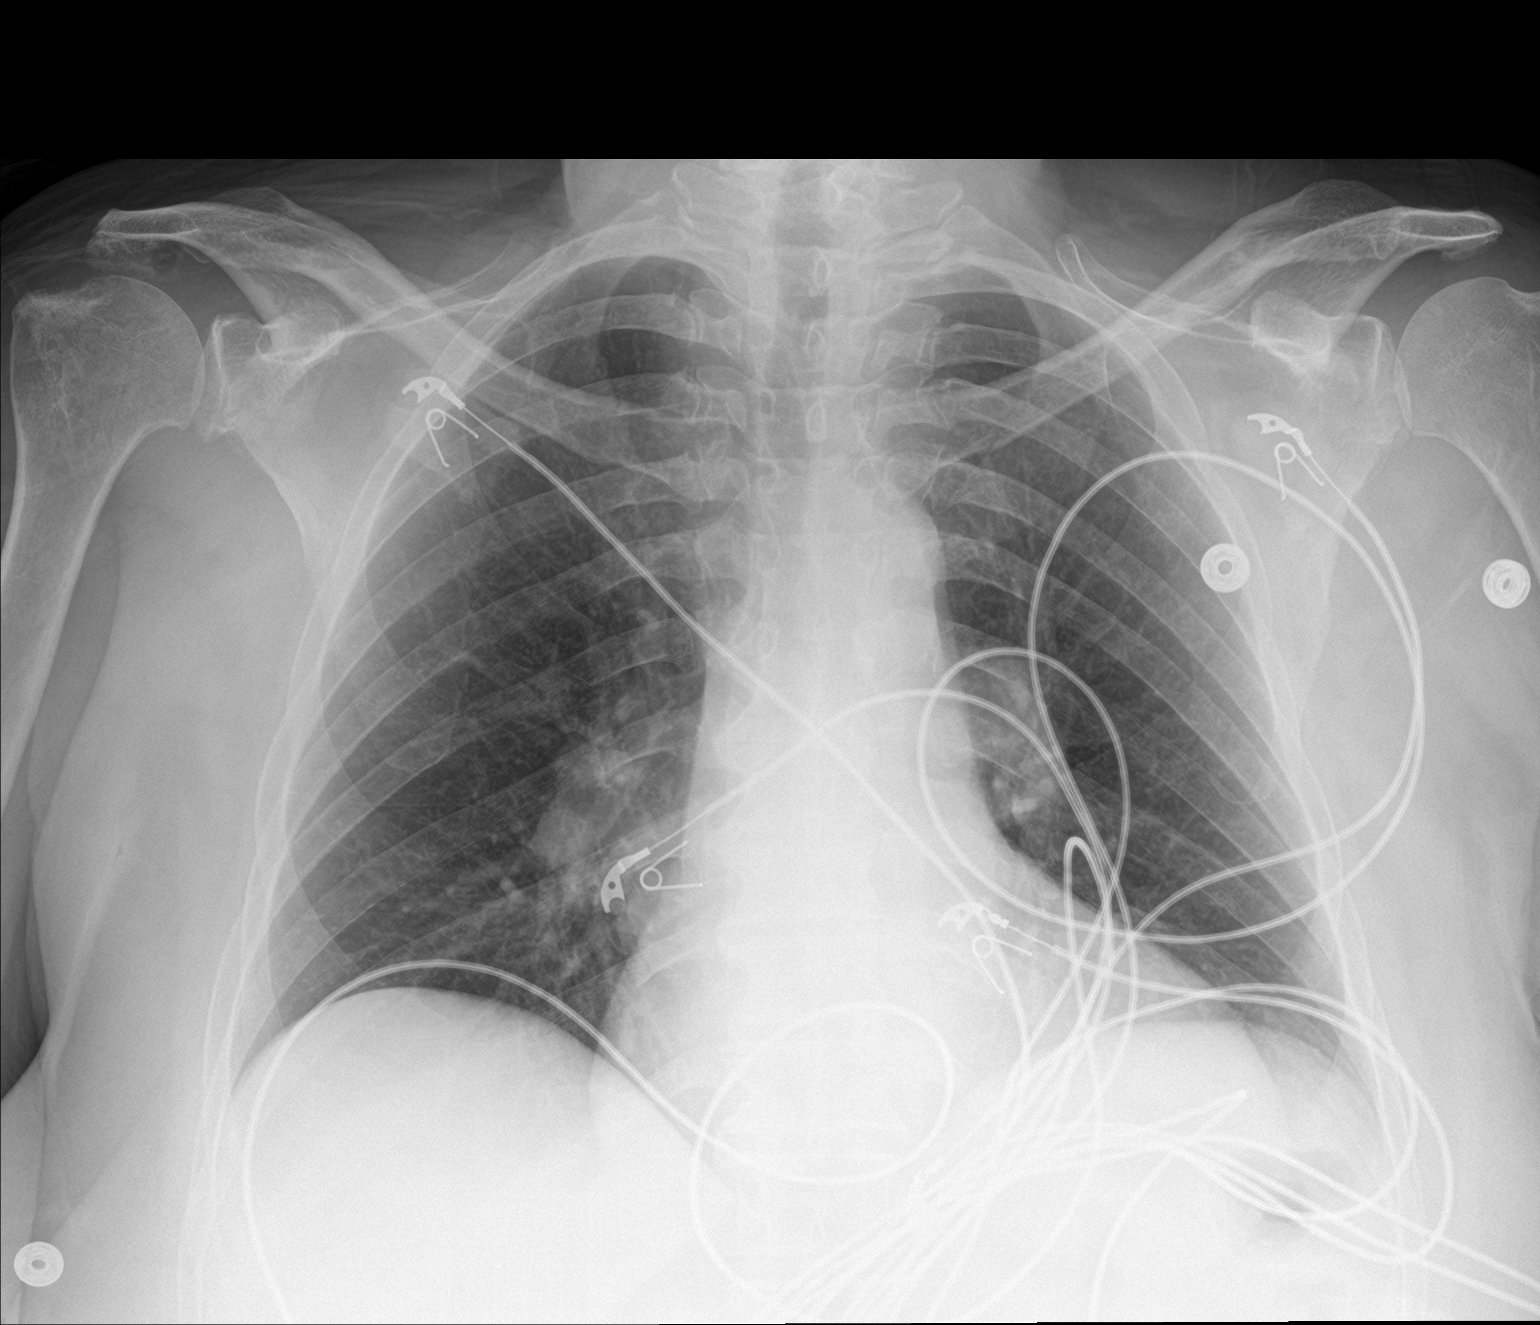

[1 of 1 positions shown; findings below may reference images not displayed]

FINDINGS: The heart size and mediastinal contours are within normal limits.
Both lungs are clear. The visualized skeletal structures are
unremarkable.
IMPRESSION: No active disease.

## 2020-05-11 MED ORDER — POTASSIUM CHLORIDE 10 MEQ/100ML IV SOLN
10.0000 meq | INTRAVENOUS | Status: AC
  Administered 2020-05-11 – 2020-05-12 (×3): 10 meq via INTRAVENOUS
  Filled 2020-05-11 (×3): qty 100

## 2020-05-11 MED ORDER — SODIUM CHLORIDE 0.9 % IV SOLN
1.0000 g | Freq: Once | INTRAVENOUS | Status: AC
  Administered 2020-05-11: 1 g via INTRAVENOUS
  Filled 2020-05-11: qty 10

## 2020-05-11 MED ORDER — SODIUM CHLORIDE 0.9 % IV SOLN: Freq: Once | INTRAVENOUS | Status: AC

## 2020-05-11 NOTE — H&P (Signed)
History and Physical  Robin [name] ZJI:[id number] DOB: Feb 08, 1966 DOA: 05/11/2020  Referring physician: Nat Christen, MD PCP: Inda Coke, [place]  Outpatient Specialists: Lawrence Marseilles, MD Patient coming from: Home  Chief Complaint: Altered mental status  HPI: Robin [name] is a 54 y.o. female with medical history significant for DM, HTN, atrial fibrillation,  anemia, CHF, COPD, ascending aortic aneurysm, CVA with residual right-sided weakness and impaired speech who presents to the  emergency department via EMS due to altered mental status. Patient was unable to provide history, history was obtained from sister at bedside and ED physician, as well as ED medical chart. Per report. Patient was reported to have become altered within last 2 days with decreased eating or drinking of water, she was also reported to complain of right-sided headache within last week. She has an appointment on 10/13 for embolization of her brain aneurysms with Dr Estanislado Pandy. There was no report of fever, chills, chest pain, shortness of breath, nausea, vomiting or diarrhea.  ED Course:  In the emergency department, she was hemodynamically stable. Work-up in the ED showed hypokalemia, normocytic anemia, BUN to creatinine 21/2.78 (baseline creatinine 1.1-1.3). CT of head without contrast showed no acute intracranial abnormality. Chest x-ray showed no active disease. She was started on IV ceftriaxone due to presumed UTI, potassium was replenished and IV hydration of 1 L of NS was provided. Teleneurology was consulted and recommended further stroke work-up in the morning. Hospitalist was asked to admit patient for further evaluation and management.  Review of Systems: This cannot be obtained at this time due to patient's current condition   Past Medical History:  Diagnosis Date  . A-fib ([place])   . Allergy   . Anemia   . Arthritis   . CHF (congestive heart failure) ([place])    a. EF at 30-35% by echo in  08/2018 b. EF at 35% by repeat echo in 05/2019  . Chronic abdominal pain   . Cocaine abuse ([place])   . COPD (chronic obstructive pulmonary disease) ([place])   . Essential hypertension, benign   . Expressive aphasia   . Expressive aphasia    post CVA  . Fatty liver   . GERD (gastroesophageal reflux disease)   . Gout 2016  . Normal coronary arteries    3/10 - following abnormal Myoview  . Ovarian cyst   . Stroke ([place]) 12/26/2019   Right sided weakness, and expressive aphasia  . Thoracic ascending aortic aneurysm (H[facility])    4 cm 10/31/19 CTA  . Type 2 diabetes mellitus ([place])    type II   Past Surgical History:  Procedure Laterality Date  . ABDOMINAL HYSTERECTOMY  09/10/2011   Procedure: HYSTERECTOMY ABDOMINAL;  Surgeon: Jonnie Kind, MD;  Location: [facility];  Service: Gynecology;  Laterality: N/A;  Abdominal hysterectomy  . CESAREAN SECTION  [id number], and 1994  . CHOLECYSTECTOMY  1995  . IR 3D INDEPENDENT WKST  03/16/2020  . IR ANGIO INTRA EXTRACRAN SEL INTERNAL CAROTID BILAT MOD SED  03/16/2020  . IR ANGIO VERTEBRAL SEL SUBCLAVIAN INNOMINATE UNI L MOD SED  03/16/2020  . IR ANGIO VERTEBRAL SEL VERTEBRAL UNI R MOD SED  03/16/2020  . IR CT HEAD LTD  12/27/2019  . IR PERCUTANEOUS ART THROMBECTOMY/INFUSION INTRACRANIAL INC DIAG ANGIO  12/27/2019  . IR US GUIDE VASC ACCESS RIGHT  12/27/2019  . IR US GUIDE VASC ACCESS RIGHT  03/16/2020  . RADIOLOGY WITH ANESTHESIA N/A 12/27/2019   Procedure: IR WITH ANESTHESIA;  Surgeon: Luanne Bras,  MD;  Location: [facility];  Service: Radiology;  Laterality: N/A;  . SCAR REVISION  09/10/2011   Procedure: SCAR REVISION;  Surgeon: Jonnie Kind, MD;  Location: [facility];  Service: Gynecology;  Laterality: N/A;  Wide Excision of old Cicatrix  . TUBAL LIGATION  1994    Social History:  reports that she quit smoking about 5 months ago. Her smoking use included cigarettes. She quit after 29.00 years of use. She has never used smokeless tobacco. She reports previous  alcohol use. She reports previous drug use. Drugs: Cocaine and Marijuana.   Allergies  Allergen Reactions  . Bee Venom Shortness Of Breath and Swelling    Bodily Swelling  . Losartan Other (See Comments)    Nosebleeds per patient report.   . Naproxen Other (See Comments)    Acid reflux  . Penicillins Nausea Only    Has patient had a PCN reaction causing immediate rash, facial/tongue/throat swelling, SOB or lightheadedness with hypotension: no Has patient had a PCN reaction causing severe rash involving mucus membranes or skin necrosis: no Has patient had a PCN reaction that required hospitalization no Has patient had a PCN reaction occurring within the last 10 years: no If all of the above answers are "NO", then may proceed with Cephalosporin   . Lisinopril     Sinus congestion    Family History  Problem Relation Age of Onset  . Cirrhosis Mother   . Early death Mother   . Alcohol abuse Mother   . Diabetes type II Father   . Alcohol abuse Father   . Diabetes type II Sister   . Early death Brother   . Alcohol abuse Son   . Anesthesia problems Neg Hx   . Hypotension Neg Hx   . Malignant hyperthermia Neg Hx   . Pseudochol deficiency Neg Hx   . Colon cancer Neg Hx   . Colon polyps Neg Hx   . Esophageal cancer Neg Hx   . Rectal cancer Neg Hx   . Stomach cancer Neg Hx     Prior to Admission medications   Medication Sig Start Date End Date Taking? Authorizing Provider  acetaminophen (TYLENOL) 650 MG CR tablet Take 650 mg by mouth every 8 (eight) hours as needed for pain.   Yes [provider]  aspirin EC 81 MG tablet Take 81 mg by mouth daily. Swallow whole.   Yes [provider]  carvedilol (COREG) 25 MG tablet Take 1 tablet (25 mg total) by mouth 2 (two) times daily with a meal. 02/29/20  Yes Inda Coke, PA  citalopram (CELEXA) 10 MG tablet Take 1 tablet by mouth once daily Patient taking differently: Take 10 mg by mouth daily.  05/03/20  Yes Inda Coke, PA  digoxin (LANOXIN) 0.125 MG tablet Take 1 tablet (0.125 mg total) by mouth daily. 02/29/20  Yes Inda Coke, PA  gabapentin (NEURONTIN) 100 MG capsule Take 1 capsule (100 mg total) by mouth 3 (three) times daily. 02/16/20  Yes Inda Coke, PA  hydrALAZINE (APRESOLINE) 25 MG tablet Take 1 tablet (25 mg total) by mouth 3 (three) times daily. 02/29/20 02/28/21 Yes Worley, Aldona Bar, PA  insulin glargine (LANTUS) 100 UNIT/ML Solostar Pen Inject 72 Units into the skin at bedtime. 02/29/20  Yes Inda Coke, PA  isosorbide mononitrate (IMDUR) 30 MG 24 hr tablet Take 2 tablets (60 mg total) by mouth daily. 02/29/20 02/28/21 Yes Worley, Aldona Bar, PA  metFORMIN (GLUCOPHAGE) 1000 MG tablet TAKE 1 TABLET BY MOUTH TWICE  DAILY WITH  A  MEAL Patient taking differently: Take 1,000 mg by mouth 2 (two) times daily.  05/03/20  Yes Inda Coke, PA  nitroGLYCERIN (NITROSTAT) 0.4 MG SL tablet Place 1 tablet (0.4 mg total) under the tongue every 5 (five) minutes as needed for chest pain. 02/01/20  Yes Angiulli, Lavon Paganini, PA-C  rosuvastatin (CRESTOR) 20 MG tablet Take 1 tablet by mouth once daily Patient taking differently: Take 20 mg by mouth daily.  05/03/20  Yes Inda Coke, PA  ticagrelor (BRILINTA) 90 MG TABS tablet Take 45 mg by mouth 2 (two) times daily.   Yes [provider]  tiZANidine (ZANAFLEX) 2 MG tablet Take 1 tablet (2 mg total) by mouth 2 (two) times daily as needed (muscle spasm). 02/01/20  Yes Angiulli, Lavon Paganini, PA-C  torsemide (DEMADEX) 20 MG tablet Take 20 mg by mouth 2 (two) times daily. 02/24/20  Yes [provider]  apixaban (ELIQUIS) 5 MG TABS tablet Take 1 tablet (5 mg total) by mouth 2 (two) times daily. 02/29/20   Inda Coke, PA  BD PEN NEEDLE NANO U/F 32G X 4 MM MISC Inject into the skin as directed. 02/01/20   [provider]  enoxaparin (LOVENOX) 150 MG/ML injection Inject 1 mL (150 mg total) into the skin daily for 2 days. Patient not  taking: Reported on 05/11/2020 03/14/20 05/11/20  Inda Coke, PA  nystatin ointment (MYCOSTATIN) Apply to affected area 1-2 times daily Patient not taking: Reported on 03/16/2020 02/16/20   Inda Coke, PA    Physical Exam: BP 109/66   Pulse 66   Temp 98.5 F (36.9 C) (Oral)   Resp (!) 21   Ht 5\' 7"  (1.702 m)   Wt 90.7 kg   LMP 08/21/2011   SpO2 99%   BMI 31.32 kg/m   . General: 54 y.o. year-old female well developed well nourished in no acute distress.  Alert and oriented x3. Marland Kitchen HEENT: Normocephalic, atraumatic . Neck: Supple, trachea medial . Cardiovascular: Regular rate and rhythm with no rubs or gallops.  No thyromegaly or JVD noted.  No lower extremity edema. 2/4 pulses in all 4 extremities. Marland Kitchen Respiratory: Clear to auscultation with no wheezes or rales. Good inspiratory effort. . Abdomen: Soft nontender nondistended with normal bowel sounds x4 quadrants. . Muskuloskeletal: No cyanosis, clubbing or edema noted bilaterally . Neuro: Patient was able to follow commands. Strength: RUE and RLE-4/5. LUE and LLE 5/5  . Skin: No ulcerative lesions noted or rashes  . Psychiatry: This cannot be obtained at this time due to patient's current condition         Labs on Admission:  Basic Metabolic Panel: Recent Labs  Lab 05/11/20 2016  NA 134*  K 2.9*  CL 96*  CO2 23  GLUCOSE 123*  BUN 21*  CREATININE 2.78*  CALCIUM 8.7*  MG 1.3*   Liver Function Tests: No results for input(s): AST, ALT, ALKPHOS, BILITOT, PROT, ALBUMIN in the last 168 hours. No results for input(s): LIPASE, AMYLASE in the last 168 hours. No results for input(s): AMMONIA in the last 168 hours. CBC: Recent Labs  Lab 05/11/20 2016  WBC 10.8*  HGB 11.6*  HCT 34.8*  MCV 84.3  PLT 333   Cardiac Enzymes: No results for input(s): CKTOTAL, CKMB, CKMBINDEX, TROPONINI in the last 168 hours.  BNP (last 3 results) Recent Labs    10/31/19 0635 12/26/19 2255  BNP 237.0* 367.0*    ProBNP (last 3  results) No results for input(s): PROBNP  in the last 8760 hours.  CBG: Recent Labs  Lab 05/11/20 2057  GLUCAP 138*    Radiological Exams on Admission: CT Head Wo Contrast  Result Date: 05/11/2020 CLINICAL DATA:  Right arm deficit EXAM: CT HEAD WITHOUT CONTRAST TECHNIQUE: Contiguous axial images were obtained from the base of the skull through the vertex without intravenous contrast. COMPARISON:  March 19, 2020 FINDINGS: Brain: Large area of encephalomalacia involving the left frontotemporal lobe as on prior exam. There is ex vacuo dilatation of the anterior horn of the left lateral ventricle. No acute loss of gray-white differentiation or extra-axial collections are seen. There is mild low-attenuation changes in the deep white matter consistent with small vessel ischemia. Ventricles are normal in size and contour. Vascular: No hyperdense vessel or unexpected calcification. Skull: The skull is intact. No fracture or focal lesion identified. Sinuses/Orbits: The visualized paranasal sinuses and mastoid air cells are clear. The orbits and globes intact. Other: None IMPRESSION: No acute intracranial abnormality. Stable area of encephalomalacia involving the left frontotemporal lobe. Electronically Signed   By: Prudencio Pair M.D.   On: 05/11/2020 20:46   DG Chest Portable 1 View  Result Date: 05/11/2020 CLINICAL DATA:  Change in mental status EXAM: PORTABLE CHEST 1 VIEW COMPARISON:  Dec 27, 2019 FINDINGS: The heart size and mediastinal contours are within normal limits. Both lungs are clear. The visualized skeletal structures are unremarkable. IMPRESSION: No active disease. Electronically Signed   By: Prudencio Pair M.D.   On: 05/11/2020 20:41    EKG: I independently viewed the EKG done and my findings are as followed: EKG showed A. fib with rate control but with prolonged QTc (619 ms)  Assessment/Plan Present on Admission: . Altered mental status . Hypokalemia . AKI (acute kidney injury) ([place]) .  Essential hypertension, benign . COPD (chronic obstructive pulmonary disease) (HCC)  Principal Problem:   Altered mental status Active Problems:   COPD (chronic obstructive pulmonary disease) (HCC)   Essential hypertension, benign   Hypokalemia   AKI (acute kidney injury) ([place])   Right hemiparesis (HCC)   UTI (urinary tract infection)   Prolonged QT interval   History of CVA (cerebrovascular accident)   Atrial fibrillation, chronic (HCC)   History of CHF (congestive heart failure)   Altered mental status R/O acute stroke vs toxic-metabolic encephalopathy vs infectious CT of head without contrast showed no acute intracranial abnormality Teleneurologist was consulted and patient was not a candidate for thrombolytics or thrombectomy Patient will be admitted to telemetry unit  Echocardiogram in the morning MRI of brain without contrast in the morning MRA head without contrast and MRA neck with contrast Continue statin Continue fall precautions and neuro checks Lipid panel, B12, TSH, folate and hemoglobin A1c will be checked Permissive hypertension will be allowed at this time with goal to treat only if BP is greater than 220/120 per tele-neurology recommendation Continue PT/ST/OT eval and treat Bedside swallow eval by nursing prior to diet Teleneurology consult was consulted and offered recommendations. Teleneurology consult appreciated  Neurology will be consulted in the morning Electrolyte abnormalities (hypokalemia, hypomagnesemia) Hypokalemia 2.9, this was replenished Hypomagnesemia 1.3, this will be replenished  Presumed UTI POA Urinalysis was positive for large leukocytes and WBC >50 but rare bacteria Patient was unable to provide any history regarding irritative bladder symptoms She was presumed to have UTI and was started on IV ceftriaxone in the ED We shall continue with same at this time with plan to de-escalate based on urine culture which is pending at  this  time  Prolonged QTc QTc 679ms Avoid QT prolonging drugs Potassium was replenished Magnesium will be replenished  Repeat EKG in the morning  Acute kidney injury BUN to creatinine 21/2.78 (baseline creatinine 1.1-1.3). Renally adjust medications, avoid nephrotoxic agents/dehydration/hypotension  COPD (not in acute exacerbation) No medication against COPD noted on med rec.  We shall await updated med rec  History of CVA with residual right-sided weakness Home meds will be resumed once patient is cleared for oral intake  A. fib with rate control EKG presents with A. fib with rate control Continue telemetry Resume Coreg once patient is cleared for oral intake and recurrent stroke ruled out  Essential hypertension (controlled) Permissive hypertension will be allowed at this time pending recurrent stroke rule out  History of CHF Home meds will be resumed once patient is cleared for oral intake. Echocardiogram done on in May 4742 showed systolic dysfunction with EF of 30 to 35% The left ventricle has moderately decreased function. The left ventricle demonstrates global hypokinesis. The left ventricular internal cavity size was mildly to moderately dilated. There is moderate asymmetric left ventricular hypertrophy. Left ventricular diastolic function could not be evaluated. 2. Right ventricular systolic function is normal. The right ventricular size is normal. 3. Left atrial size was severely dilated. 4. Right atrial size was severely dilated. 5. The mitral valve is grossly normal.   Echocardiogram done on 04/26/2020 showed 1. Left ventricle ejection fraction by estimation, is 65 to 70%, significantly improved compared to the prior study in May 2021. The left ventricle has normal function. The left ventricle has no regional wall motion abnormalities. There is moderate left ventricular hypertrophy. Left ventricular diastolic parameters are indeterminate. 2. Right ventricular systolic  function is normal. The right ventricular size is normal. Tricuspid regurgitation signal is inadequate for assessing PA pressure. 3. Left atrial size was moderately dilated. 4. Moderate pericardial effusion. The pericardial effusion is posterior to the left ventricle. 5. The mitral valve is grossly normal. Trivial mitral valve regurgitation. 6. The aortic valve is possibly bicuspid. There is mild calcification of the aortic valve. Aortic valve regurgitation is not visualized. Mild to moderate aortic valve stenosis. Aortic valve mean gradient measures 11.0 mmHg. Aortic valve Vmax measures 2.40 m/s. Dimentionless index 0.46. 7. The inferior vena cava is normal in size with <50% respiratory variability, suggesting right atrial pressure of 8 mmHg.   DVT prophylaxis: SCDs (no indication for chemoprophylaxis at this time due to suspicion for acute ischemic stroke)  Code Status: Full code  Family Communication: Sister at bedside (all questions answered to satisfaction)  Disposition Plan:  Patient is from:                        home Anticipated DC to:                   SNF or family members home Anticipated DC date:               2-3 days Anticipated DC barriers:          Patient is currently unstable for discharge at this time due to new onset altered mental status requiring stroke work-up rule out and presumed UTI currently on IV antibiotics.   Consults called: Teleneurology  Admission status: Inpatient    Bernadette Hoit MD [facility] Pager [phone]  If 7PM-7AM, please contact night-coverage [email or web address] Password [email or web address]  05/12/2020, 2:17 AM

## 2020-05-11 NOTE — Consult Note (Signed)
[facility] TeleNeurology Consult Services  Stat Consult  Date of Service:   05/11/2020 21:44:09  Impression:     .  R41.82 - AMS (Altered Mental Status)  Comments/Sign-Out: 54yo woman w/PMH of HTN, DM, CHF, COPD, Gout, ascending aortic aneurysm, Afib, CVA w/residual right sided weakness and slurred speech p/w AMS on 05/11/20. Her sister at bedside reports she has progressively become more altered over the last few days and hasn't been eating/drinking much during that time. She has been reporting right sided headache and body pain for the last week. She is scheduled to have embolization of her brain aneurysms done on 10/13. She only said "I don't know" during exam, she followed some commands. NIHSS 10 for limited speech output, right>left leg drift. CT Head has no acute findings. Pt is not a candidate for thrombolytics or thrombectomy due to being out of the 24 hour window. Differential for her presentation includes but is not limited to infectious or toxic-metabolic encephalopathy, acute ischemic stroke. Plan listed below was recommended to the ED physician by phone.  CT HEAD: Showed No Acute Hemorrhage or Acute Core Infarct  Our recommendations are outlined below.  Diagnostic Studies: Recommend MRI brain without contrast Routine MRA head without contrast and MRA Neck with contrast  Laboratory Studies: Recommend Lipid panel,Hemoglobin A1c, B12, TSH, Folate  Medication: Resume/continue anticoagulation/antiplatelet meds for now Permissive hypertension, Antihypertensives with prn for first 24-48 hrs post stroke onset. If BP greater than 220/120 give Labetalol IV or Vasotec IV Statins for LDL goal less than 70  Nursing Recommendations: Telemetry, IV Fluids, avoid dextrose containing fluids, Maintain euglycemia Neuro checks q4 hrs x 24 hrs and then per shift Head of bed 30 degrees  Consultations: Recommend Speech therapy if failed dysphagia screen Physical  therapy/Occupational therapy  DVT Prophylaxis: Choice of Primary Team  Disposition: Neurology will follow  ----------------------------------------------------------------------------------------------------  Chief Complaint: AMS  History of Present Illness: Patient is a 54 year old Female.  54yo woman w/PMH of HTN, DM, CHF, COPD, Gout, ascending aortic aneurysm, Afib, CVA w/residual right sided weakness and slurred speech p/w AMS on 05/11/20. Her sister at bedside reports she has progressively become more altered over the last few days and hasn't been eating/drinking much during that time. She has been reporting right sided headache and body pain for the last week. She is scheduled to have embolization of her brain aneurysms done on 10/13. She only said "I don't know" during exam, she followed some commands.   Anticoagulant use:  eliquis  Antiplatelet use: aspirin, brilinta    Examination: BP(121/67), Pulse(76), Blood Glucose(138) 1A: Level of Consciousness - Alert; keenly responsive + 0 1B: Ask Month and Age - Could Not Answer Either Question Correctly + 2 1C: Blink Eyes & Squeeze Hands - Performs Both Tasks + 0 2: Test Horizontal Extraocular Movements - Normal + 0 3: Test Visual Fields - No Visual Loss + 0 4: Test Facial Palsy (Use Grimace if Obtunded) - Normal symmetry + 0 5A: Test Left Arm Motor Drift - No Drift for 10 Seconds + 0 5B: Test Right Arm Motor Drift - No Drift for 10 Seconds + 0 6A: Test Left Leg Motor Drift - Some Effort Against Gravity + 2 6B: Test Right Leg Motor Drift - No Movement + 4 7: Test Limb Ataxia (FNF/Heel-Shin) - No Ataxia + 0 8: Test Sensation - Normal; No sensory loss + 0 9: Test Language/Aphasia - Severe Aphasia: Fragmentary Expression, Inference Needed, Cannot Identify Materials + 2 10: Test Dysarthria -  Normal + 0 11: Test Extinction/Inattention - No abnormality + 0  NIHSS Score: 10   Patient/Family was informed the Neurology Consult  would occur via TeleHealth consult by way of interactive audio and video telecommunications and consented to receiving care in this manner.  Patient is being evaluated for possible acute neurologic impairment and high probability of imminent or life-threatening deterioration. I spent total of 30 minutes providing care to this patient, including time for face to face visit via telemedicine, review of medical records, imaging studies and discussion of findings with providers, the patient and/or family.   Dr Lenard Galloway Meika Earll   [facility] [phone]  Case [id number]

## 2020-05-11 NOTE — ED Triage Notes (Signed)
Family reports patient has had  altered mental status since 3 p.m. today. Patient's cbg 134. Patient normally has right sided weakness and slurred speech  from a previous CVA. Patient also has hx of aneurysms. Family reports patient more altered today than her normal.
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
## 2020-05-12 ENCOUNTER — Inpatient Hospital Stay (HOSPITAL_COMMUNITY): Payer: 59

## 2020-05-12 ENCOUNTER — Inpatient Hospital Stay (HOSPITAL_COMMUNITY)
Admit: 2020-05-12 | Discharge: 2020-05-12 | Disposition: A | Discharge status: Home / Self Care | Payer: 59 | Attending: Neurology | Admitting: Neurology

## 2020-05-12 DIAGNOSIS — Z8679 Personal history of other diseases of the circulatory system: Secondary | ICD-10-CM

## 2020-05-12 DIAGNOSIS — R4182 Altered mental status, unspecified: Secondary | ICD-10-CM | POA: Diagnosis not present

## 2020-05-12 DIAGNOSIS — R9431 Abnormal electrocardiogram [ECG] [EKG]: Secondary | ICD-10-CM

## 2020-05-12 DIAGNOSIS — I340 Nonrheumatic mitral (valve) insufficiency: Secondary | ICD-10-CM

## 2020-05-12 DIAGNOSIS — Z8673 Personal history of transient ischemic attack (TIA), and cerebral infarction without residual deficits: Secondary | ICD-10-CM

## 2020-05-12 DIAGNOSIS — N390 Urinary tract infection, site not specified: Secondary | ICD-10-CM

## 2020-05-12 DIAGNOSIS — I6930 Unspecified sequelae of cerebral infarction: Secondary | ICD-10-CM

## 2020-05-12 DIAGNOSIS — I482 Chronic atrial fibrillation, unspecified: Secondary | ICD-10-CM

## 2020-05-12 LAB — COMPREHENSIVE METABOLIC PANEL
ALT: 6 U/L (ref 0–44)
AST: 9 U/L — ABNORMAL LOW (ref 15–41)
Albumin: 3 g/dL — ABNORMAL LOW (ref 3.5–5.0)
Alkaline Phosphatase: 50 U/L (ref 38–126)
Anion gap: 12 (ref 5–15)
BUN: 19 mg/dL (ref 6–20)
CO2: 20 mmol/L — ABNORMAL LOW (ref 22–32)
Calcium: 7.7 mg/dL — ABNORMAL LOW (ref 8.9–10.3)
Chloride: 103 mmol/L (ref 98–111)
Creatinine, Ser: 2.38 mg/dL — ABNORMAL HIGH (ref 0.44–1.00)
GFR calc non Af Amer: 23 mL/min — ABNORMAL LOW (ref >60)
Glucose, Bld: 114 mg/dL — ABNORMAL HIGH (ref 70–99)
Potassium: 3.2 mmol/L — ABNORMAL LOW (ref 3.5–5.1)
Sodium: 135 mmol/L (ref 135–145)
Total Bilirubin: 0.7 mg/dL (ref 0.3–1.2)
Total Protein: 6.3 g/dL — ABNORMAL LOW (ref 6.5–8.1)

## 2020-05-12 LAB — BASIC METABOLIC PANEL
Anion gap: 11 (ref 5–15)
BUN: 18 mg/dL (ref 6–20)
CO2: 22 mmol/L (ref 22–32)
Calcium: 8 mg/dL — ABNORMAL LOW (ref 8.9–10.3)
Chloride: 103 mmol/L (ref 98–111)
Creatinine, Ser: 2.39 mg/dL — ABNORMAL HIGH (ref 0.44–1.00)
GFR calc non Af Amer: 22 mL/min — ABNORMAL LOW (ref >60)
Glucose, Bld: 113 mg/dL — ABNORMAL HIGH (ref 70–99)
Potassium: 3.2 mmol/L — ABNORMAL LOW (ref 3.5–5.1)
Sodium: 136 mmol/L (ref 135–145)

## 2020-05-12 LAB — PROTIME-INR
INR: 1.5 — ABNORMAL HIGH (ref 0.8–1.2)
Prothrombin Time: 17.5 seconds — ABNORMAL HIGH (ref 11.4–15.2)

## 2020-05-12 LAB — MAGNESIUM
Magnesium: 1.3 mg/dL — ABNORMAL LOW (ref 1.7–2.4)
Magnesium: 1.2 mg/dL — ABNORMAL LOW (ref 1.7–2.4)

## 2020-05-12 LAB — CBC
HCT: 29.5 % — ABNORMAL LOW (ref 36.0–46.0)
Hemoglobin: 10 g/dL — ABNORMAL LOW (ref 12.0–15.0)
MCH: 28.6 pg (ref 26.0–34.0)
MCHC: 33.9 g/dL (ref 30.0–36.0)
MCV: 84.3 fL (ref 80.0–100.0)
Platelets: 250 10*3/uL (ref 150–400)
RBC: 3.5 MIL/uL — ABNORMAL LOW (ref 3.87–5.11)
RDW: 13.9 % (ref 11.5–15.5)
WBC: 8.8 10*3/uL (ref 4.0–10.5)
nRBC: 0 % (ref 0.0–0.2)

## 2020-05-12 LAB — ECHOCARDIOGRAM LIMITED
Height: 67 in
S' Lateral: 2.39 cm
Weight: 3200 oz

## 2020-05-12 LAB — GLUCOSE, CAPILLARY
Glucose-Capillary: 117 mg/dL — ABNORMAL HIGH (ref 70–99)
Glucose-Capillary: 107 mg/dL — ABNORMAL HIGH (ref 70–99)

## 2020-05-12 LAB — CREATININE, URINE, RANDOM: Creatinine, Urine: 164.15 mg/dL

## 2020-05-12 LAB — RESPIRATORY PANEL BY RT PCR (FLU A&B, COVID)
Influenza A by PCR: NEGATIVE
Influenza B by PCR: NEGATIVE
SARS Coronavirus 2 by RT PCR: NEGATIVE

## 2020-05-12 LAB — VITAMIN B12: Vitamin B-12: 244 pg/mL (ref 180–914)

## 2020-05-12 LAB — HEMOGLOBIN A1C
Hgb A1c MFr Bld: 8.2 % — ABNORMAL HIGH (ref 4.8–5.6)
Mean Plasma Glucose: 188.64 mg/dL

## 2020-05-12 LAB — TSH: TSH: 1.683 u[IU]/mL (ref 0.350–4.500)

## 2020-05-12 LAB — SODIUM, URINE, RANDOM: Sodium, Ur: 23 mmol/L

## 2020-05-12 LAB — APTT: aPTT: 33 seconds (ref 24–36)

## 2020-05-12 LAB — PHOSPHORUS: Phosphorus: 2.2 mg/dL — ABNORMAL LOW (ref 2.5–4.6)

## 2020-05-12 IMAGING — MR MR MRA NECK WO/W CM
4 series · 41 of 48 positions shown · IV contrast (9ml Gadavist)
Comparison: MRI [DATE], correlation made with CT head
[DATE], prior angiogram

CLINICAL DATA: Altered mental status, headache, history of aneurysm

EXAM:
MRI HEAD WITHOUT CONTRAST
MRA HEAD WITHOUT CONTRAST
MRA NECK WITHOUT AND WITH CONTRAST
TECHNIQUE: Multiplanar, multiecho pulse sequences of the brain and surrounding
structures were obtained without intravenous contrast. Angiographic
images of the Circle of Willis were obtained using MRA technique
without intravenous contrast. Angiographic images of the neck were
obtained using MRA technique without and with intravenous contrast.
Carotid stenosis measurements (when applicable) are obtained
utilizing NASCET criteria, using the distal internal carotid
diameter as the denominator.
CONTRAST:  9mL GADAVIST GADOBUTROL 1 MMOL/ML IV SOLN

[Series 8: tof_fl3d_tra_iso · axial · 0.6mm · 0.52mm/px · z∈[-200,-124]mm · 9 of 133 slices shown]
[im 1/133]
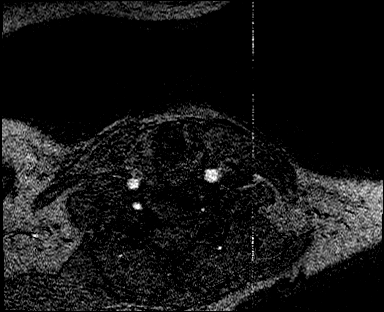
[im 19/133]
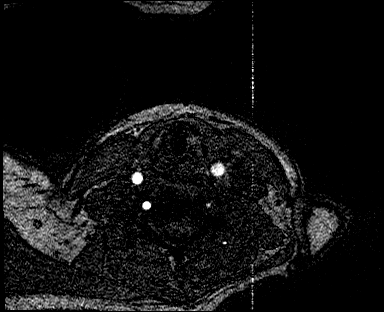
[im 38/133]
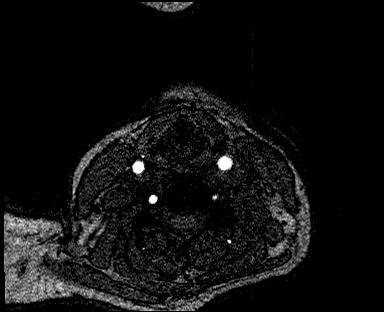
[im 57/133]
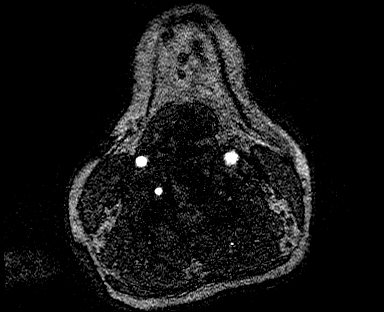
[im 67/133]
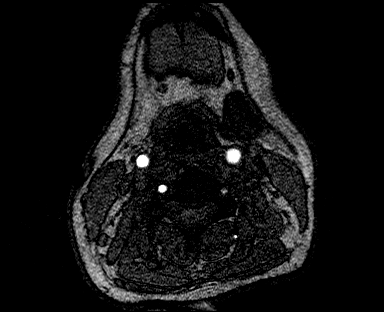
[im 76/133]
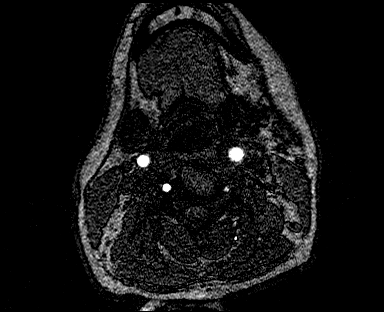
[im 95/133]
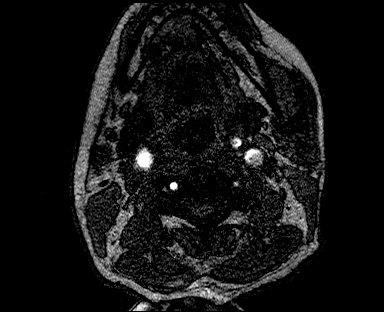
[im 114/133]
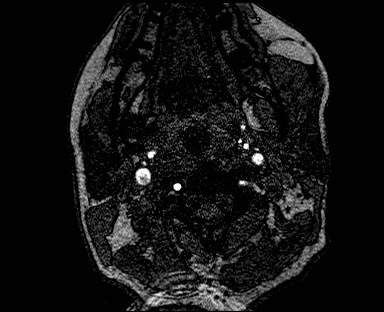
[im 133/133]
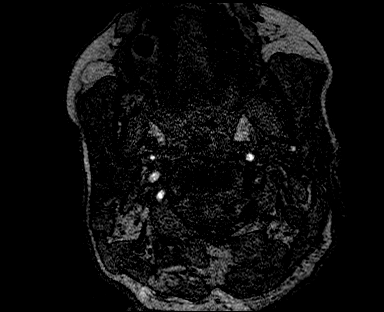

[Series 11: angio_fl3d_cor_pre_ttc=3.0s · coronal · 0.9mm · 0.85mm/px · 11 of 96 slices shown]
[im 1/96]
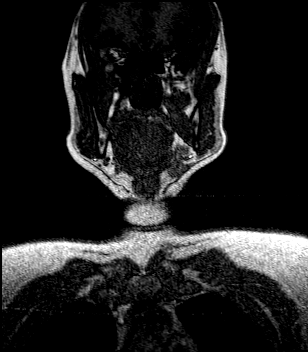
[im 10/96]
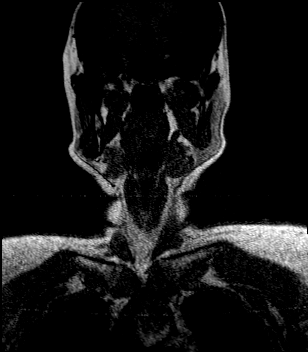
[im 20/96]
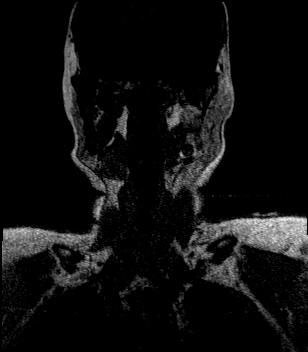
[im 29/96]
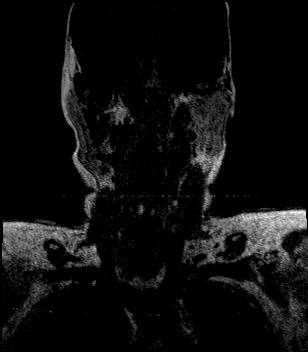
[im 39/96]
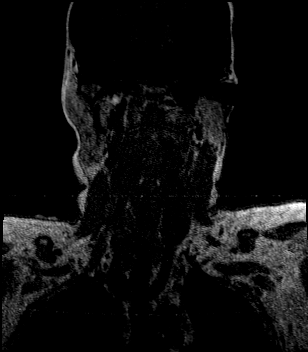
[im 48/96]
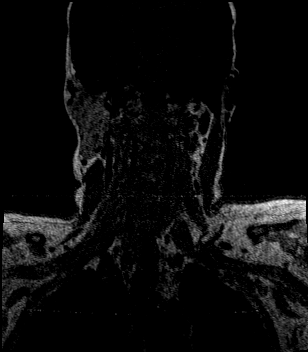
[im 58/96]
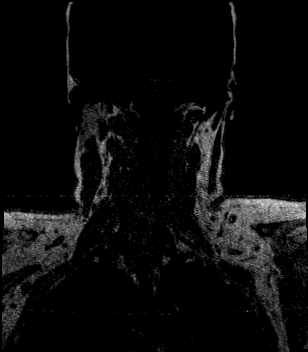
[im 67/96]
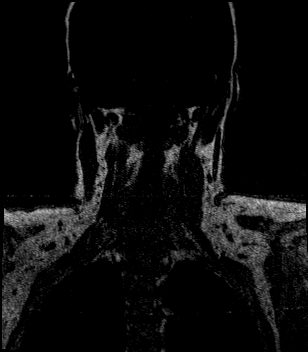
[im 77/96]
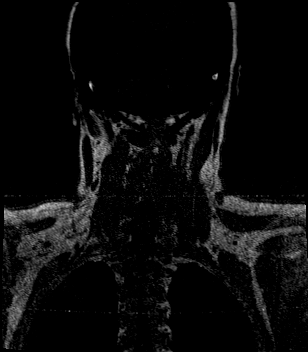
[im 86/96]
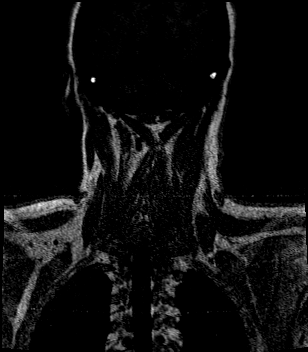
[im 96/96]
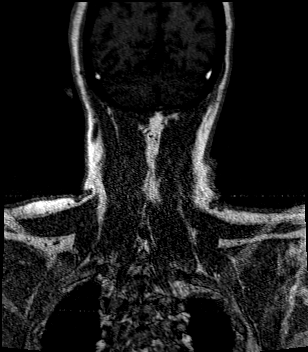

[Series 13: angio_fl3d_cor_post_ttc=3.0s · coronal · 0.9mm · 0.85mm/px · 11 of 96 slices shown]
[im 1/96]
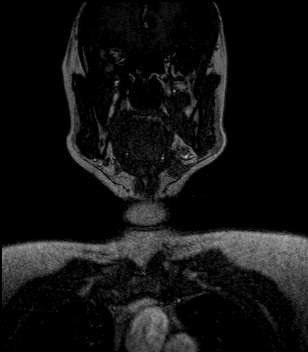
[im 10/96]
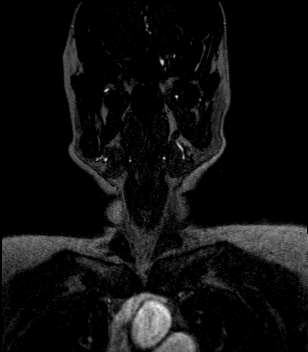
[im 20/96]
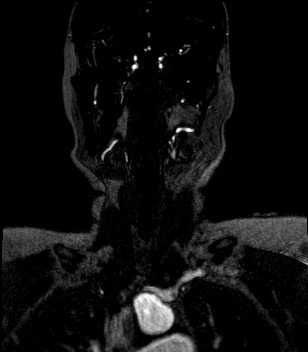
[im 29/96]
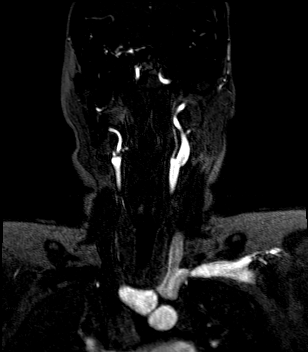
[im 39/96]
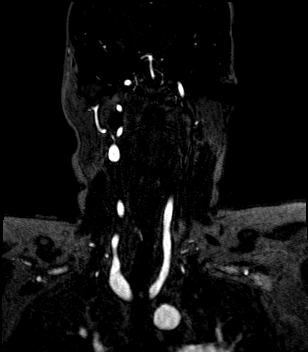
[im 48/96]
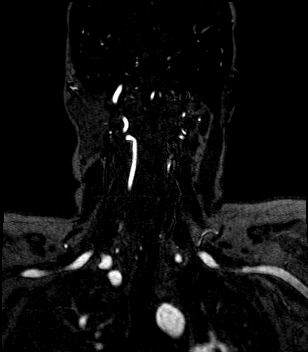
[im 58/96]
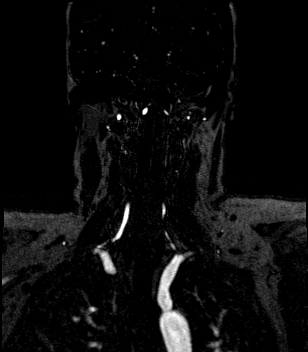
[im 67/96]
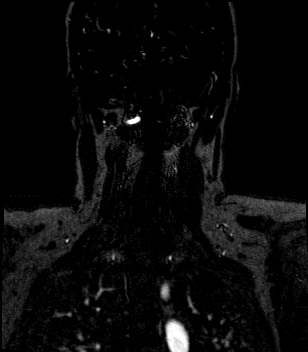
[im 77/96]
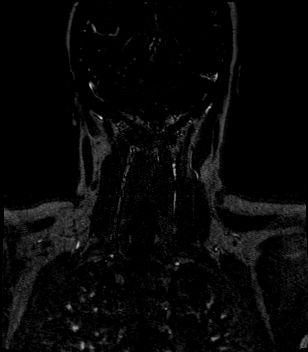
[im 86/96]
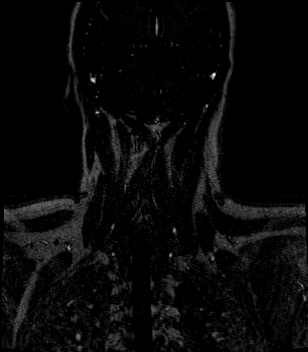
[im 96/96]
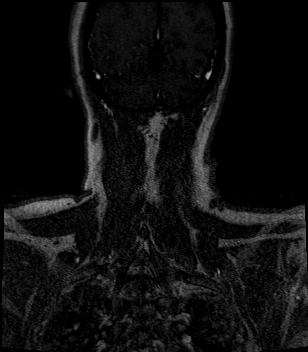

[Series 15: angio_fl3d_cor_post_ttc=3.0s_moco-adv_sub · coronal · 0.9mm · 0.85mm/px · 10 of 95 slices shown]
[im 1/95]
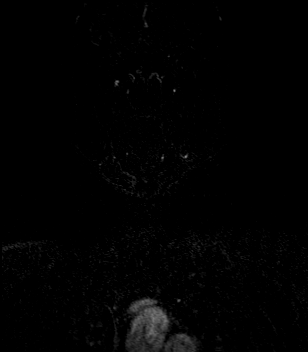
[im 10/95]
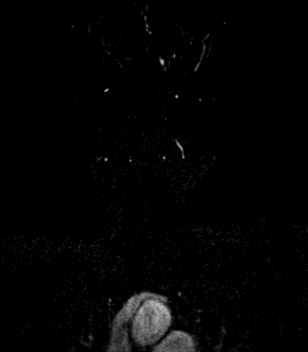
[im 19/95]
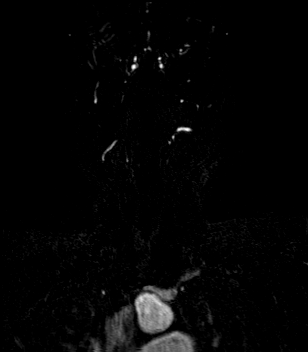
[im 29/95]
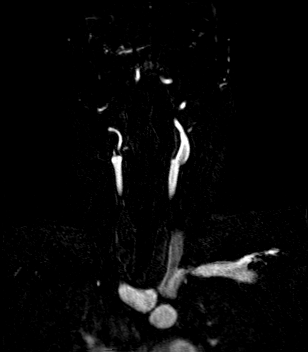
[im 38/95]
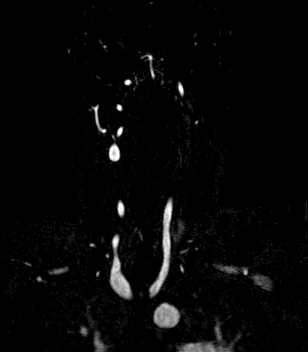
[im 48/95]
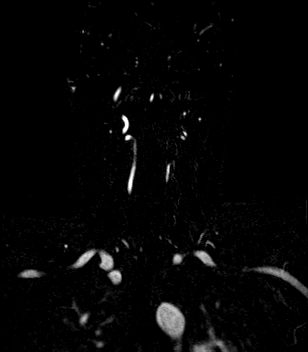
[im 57/95]
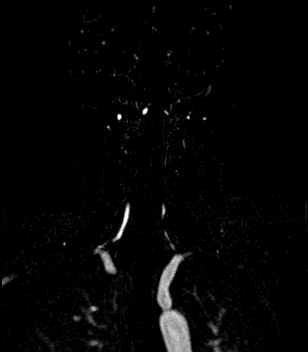
[im 66/95]
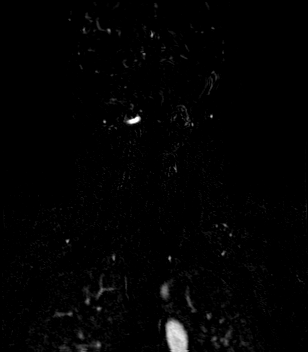
[im 76/95]
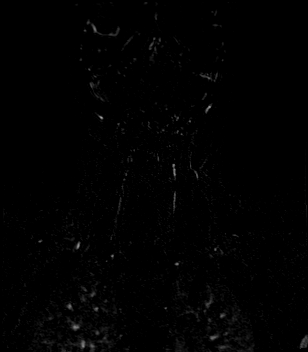
[im 85/95]
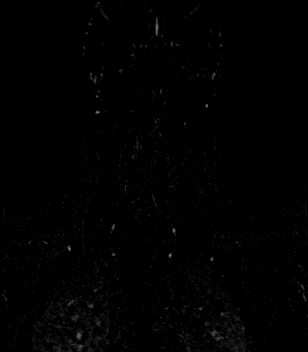

[41 of 48 positions shown; findings below may reference images not displayed]

FINDINGS: MRI HEAD

Brain: There is no acute infarction. Extensive encephalomalacia and
chronic blood products are present in the anterior/inferior left MCA
territory involving frontal lobe, insula, temporal lobe, and basal
ganglia with some parietal extension. Additional small chronic
cortical infarct in the right posterior temporal region with chronic
blood products. There is ex vacuo dilatation of the left lateral
ventricle. There is no intracranial mass, mass effect, or edema.
There is no hydrocephalus or extra-axial fluid collection.

Vascular: Better evaluated on MRA portion.

Skull and upper cervical spine: Normal marrow signal is preserved.

Sinuses/Orbits: Paranasal sinuses are aerated. Orbits are
unremarkable.

Other: Sella is unremarkable.  Mastoid air cells are clear.

MRA HEAD

Intracranial internal carotid arteries are patent. Middle and
anterior cerebral arteries are patent. Duplicated anterior
communicating artery is noted. Intracranial vertebral arteries,
basilar artery, posterior cerebral arteries are patent.

Intracranial right vertebral artery is patent. Intracranial left
vertebral artery demonstrates diminished flow related enhancement
proximally with preserved enhancement on post-contrast neck MRA.
Basilar artery and posterior cerebral arteries are patent. A left
posterior communicating artery is identified.

Aneurysms again identified arising from the right paraclinoid ICA,
communicating segment of the left ICA, and left A1-A2 junction. The
left MCA bifurcation aneurysm seen on angiogram is not identified.
Likely no substantial change in size.

MRA NECK

Common, internal, and external carotid arteries are patent. There is
no measurable stenosis at the ICA origins.

Extracranial vertebral arteries are patent. Right vertebral artery
is dominant. There is atherosclerotic irregularity of the proximal
left vertebral artery with areas of mild and moderate stenosis.
There is moderate stenosis of the proximal right V1 vertebral
artery.
IMPRESSION: No acute abnormality.  Chronic left MCA infarction.

Three of four aneurysms seen on prior catheter angiogram are again
identified and are likely without substantial change.

Extracranial vertebral artery atherosclerosis, left greater than
right.

## 2020-05-12 IMAGING — US US RENAL
1 series · 14 of 25 positions shown · non-contrast
Comparison: Abdominal ultrasound [DATE].  CT [DATE].

CLINICAL DATA: Acute kidney injury.

EXAM:
RENAL / URINARY TRACT ULTRASOUND COMPLETE

[Series 1: us renal · 14 of 50 slices shown]
[im 1/50]
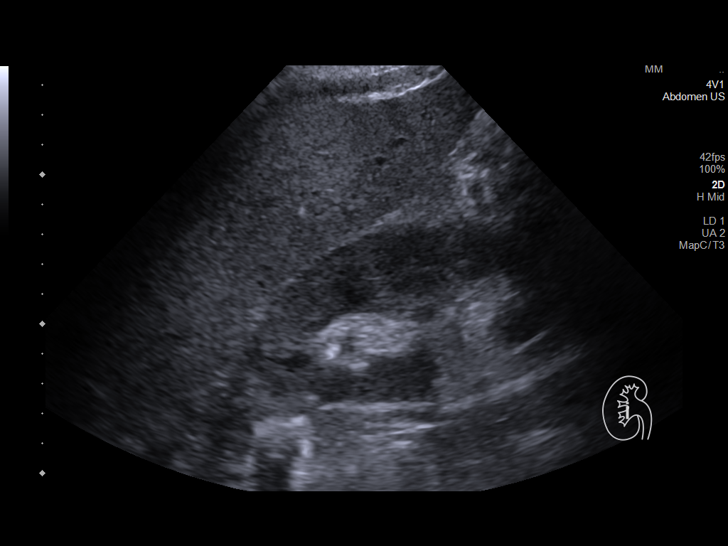
[im 5/50]
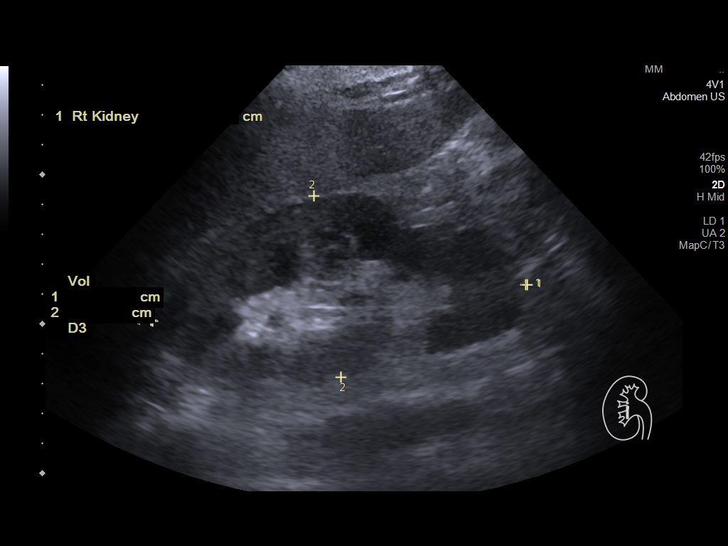
[im 9/50]
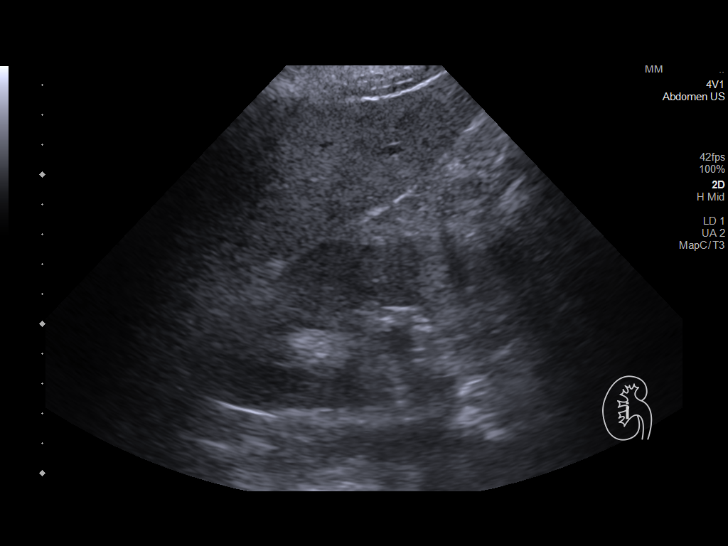
[im 13/50]
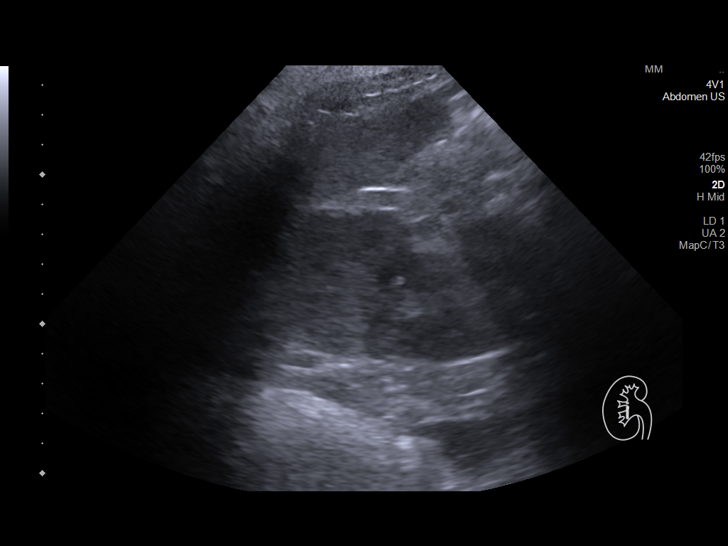
[im 17/50]
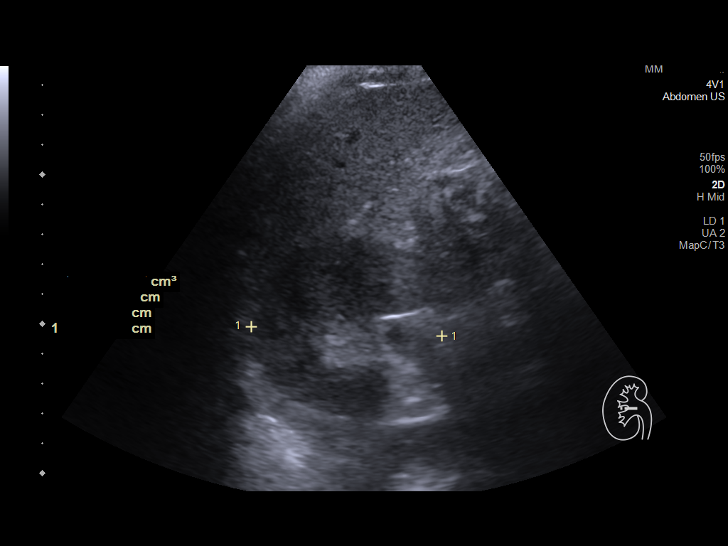
[im 19/50]
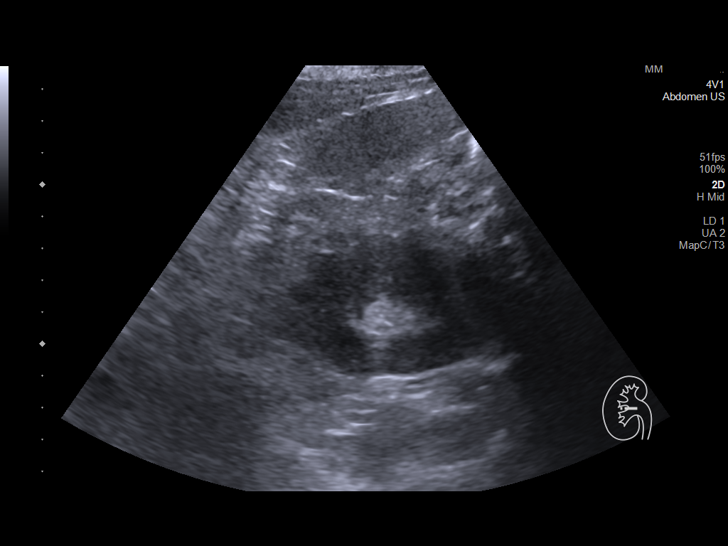
[im 23/50]
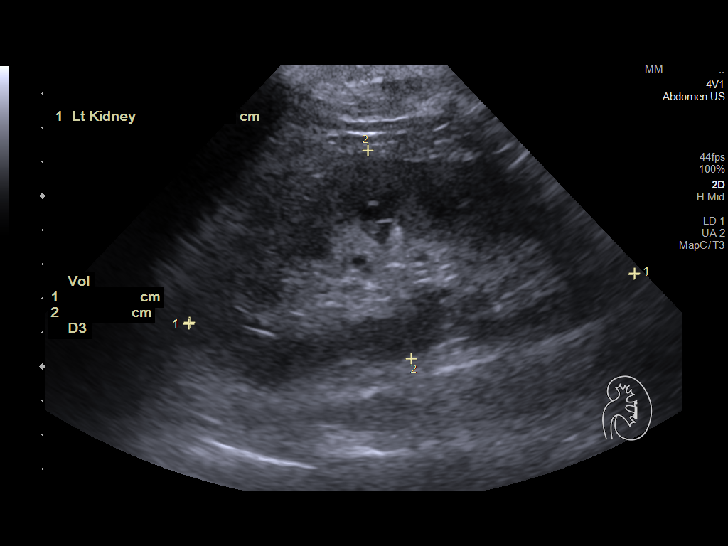
[im 27/50]
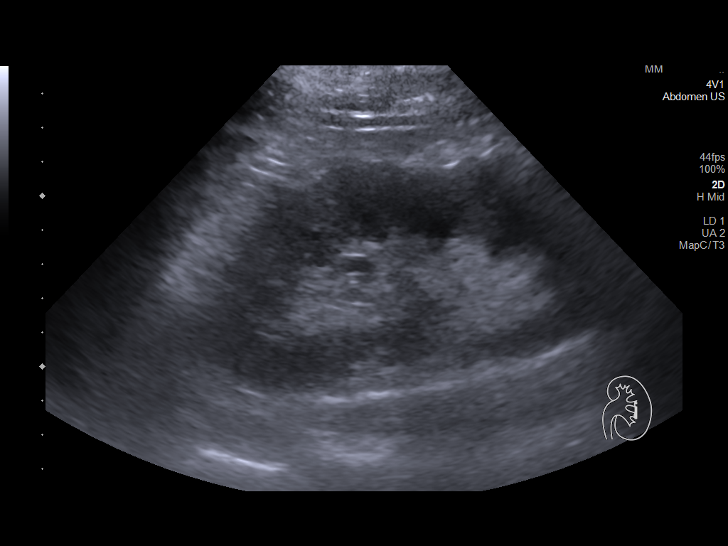
[im 31/50]
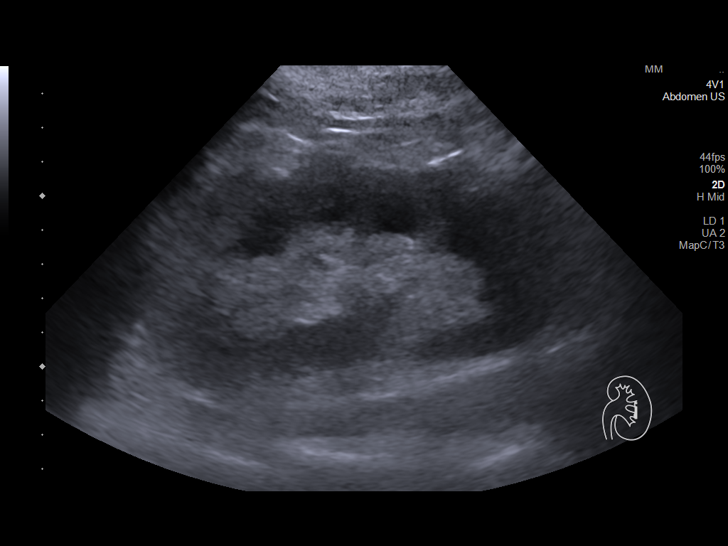
[im 33/50]
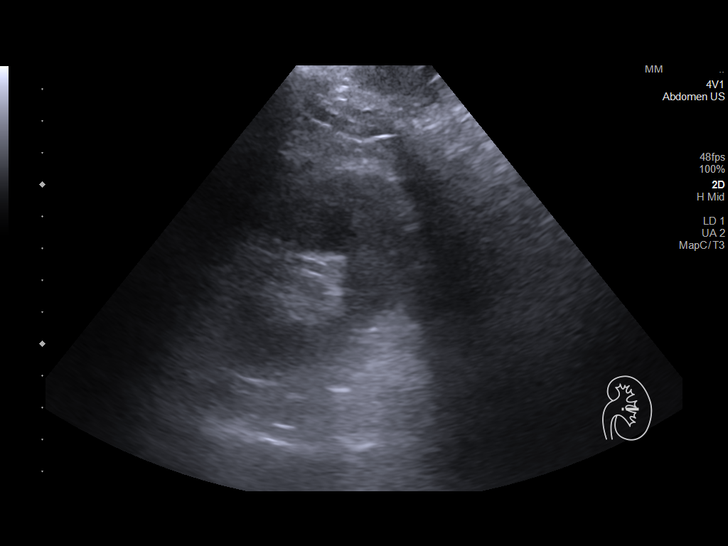
[im 37/50]
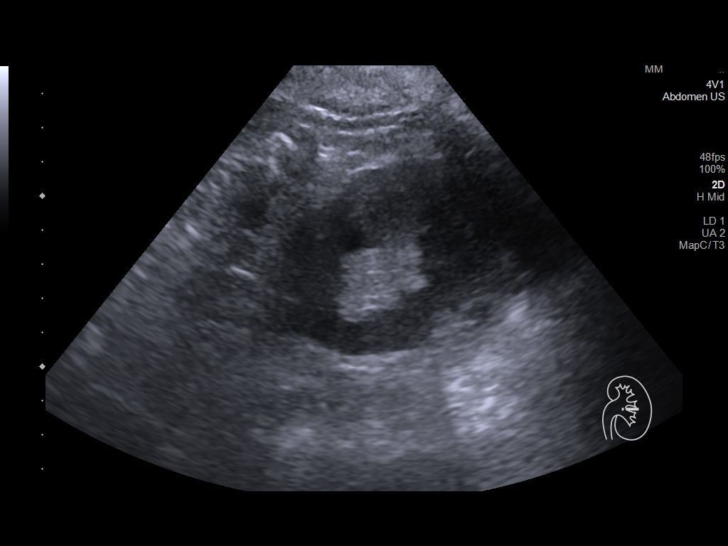
[im 41/50]
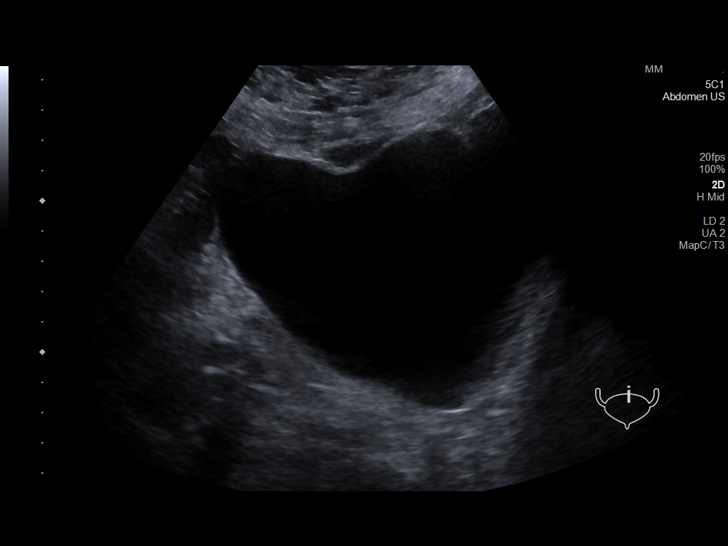
[im 45/50]
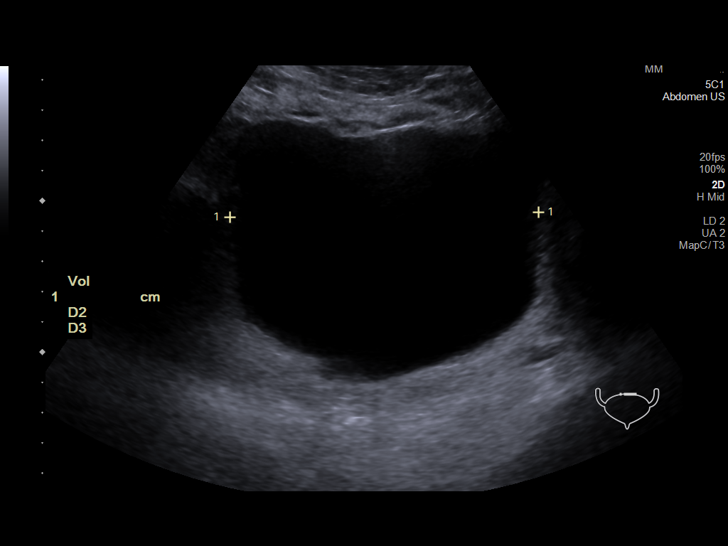
[im 50/50]
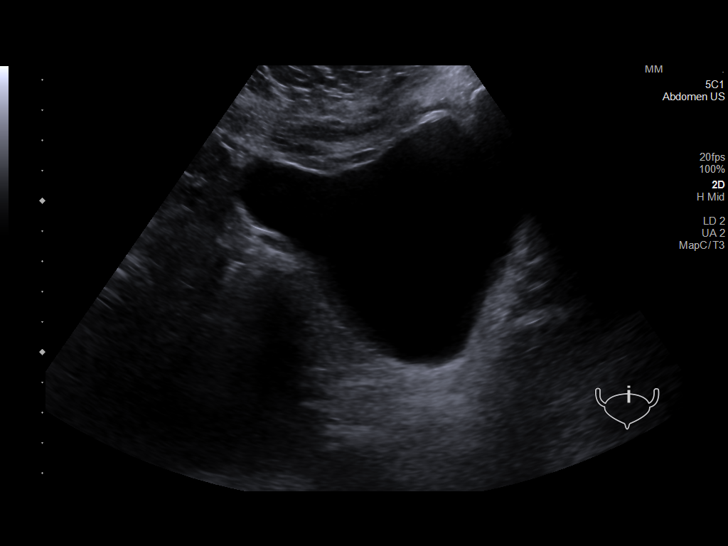

[14 of 25 positions shown; findings below may reference images not displayed]

FINDINGS: Right Kidney:

Renal measurements: 12.7 x 6.1 x 6.4 cm = volume: 259.9 mL.
Echogenicity within normal limits. No mass or hydronephrosis
visualized.

Left Kidney:

Renal measurements: 13.2 x 6.2 x 5.4 cm = volume: 233.3 mL.
Echogenicity within normal limits. No mass or hydronephrosis
visualized.

Bladder:

Mildly distended (volume 546 cc).  No focal abnormality identified.

Other:

None.
IMPRESSION: Normal renal ultrasound. No hydronephrosis.

## 2020-05-12 IMAGING — MR MR HEAD W/O CM
12 series · 48 of 48 positions shown · IV contrast (gadavist)
Comparison: MRI [DATE], correlation made with CT head
[DATE], prior angiogram

CLINICAL DATA: Altered mental status, headache, history of aneurysm

EXAM:
MRI HEAD WITHOUT CONTRAST
MRA HEAD WITHOUT CONTRAST
MRA NECK WITHOUT AND WITH CONTRAST
TECHNIQUE: Multiplanar, multiecho pulse sequences of the brain and surrounding
structures were obtained without intravenous contrast. Angiographic
images of the Circle of Willis were obtained using MRA technique
without intravenous contrast. Angiographic images of the neck were
obtained using MRA technique without and with intravenous contrast.
Carotid stenosis measurements (when applicable) are obtained
utilizing NASCET criteria, using the distal internal carotid
diameter as the denominator.
CONTRAST:  9mL GADAVIST GADOBUTROL 1 MMOL/ML IV SOLN

[Series 5: DWI · axial · 3.0mm · 0.77mm/px · z∈[-93,+52]mm · 3 of 50 slices shown (1 of 4)]
[im 1/50]
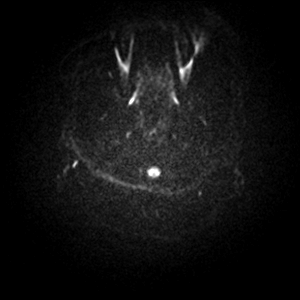
[im 25/50]
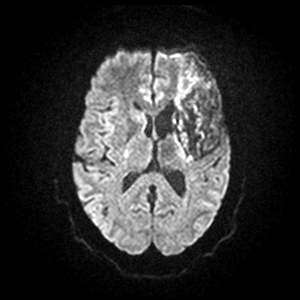
[im 50/50]
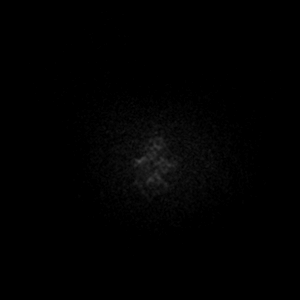

[Series 6: DWI · axial · 3.0mm · 0.77mm/px · z∈[-93,+52]mm · 4 of 50 slices shown (2 of 4)]
[im 1/50]
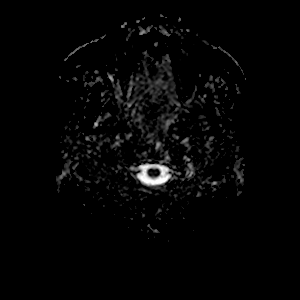
[im 17/50]
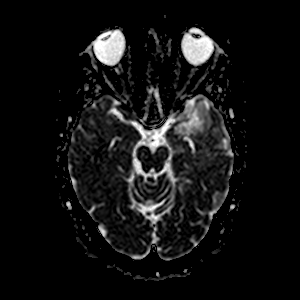
[im 33/50]
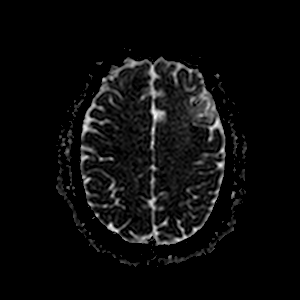
[im 50/50]
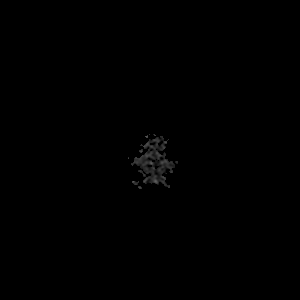

[Series 7: DWI · coronal · 5.0mm · 0.88mm/px · 2 of 28 slices shown (3 of 4)]
[im 1/28]
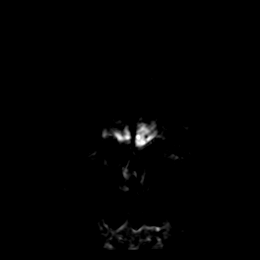
[im 28/28]
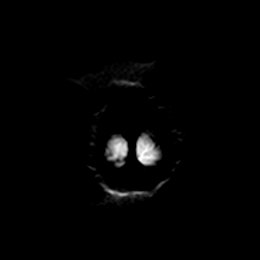

[Series 8: DWI · coronal · 5.0mm · 0.88mm/px · 2 of 28 slices shown (4 of 4)]
[im 1/28]
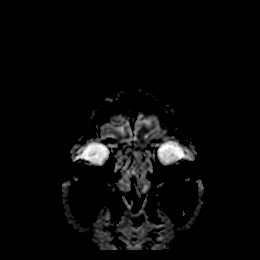
[im 28/28]
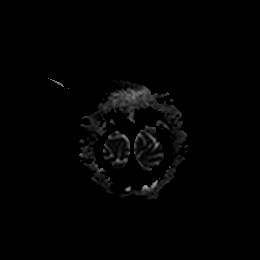

[Series 9: T1 · sagittal · 5.0mm · 0.75mm/px · 2 of 21 slices shown (1 of 2)]
[im 1/21]
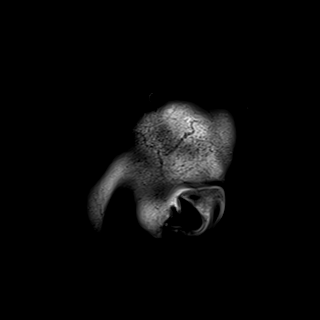
[im 21/21]
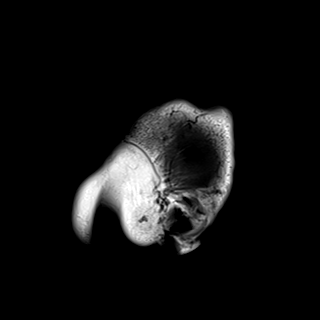

[Series 10: T2 · axial · 5.0mm · 0.72mm/px · z∈[-96,+55]mm · 2 of 23 slices shown (1 of 2)]
[im 1/23]
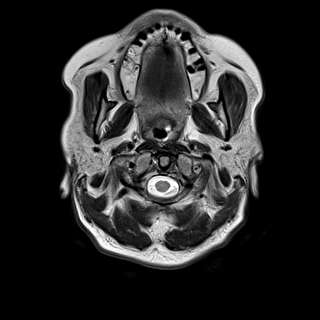
[im 23/23]
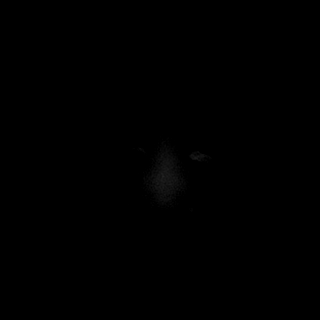

[Series 11: mag_images · axial · 3.0mm · 0.90mm/px · z∈[-109,+65]mm · 5 of 60 slices shown]
[im 1/60]
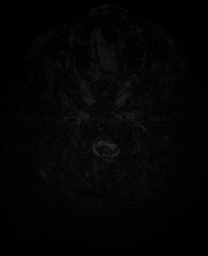
[im 15/60]
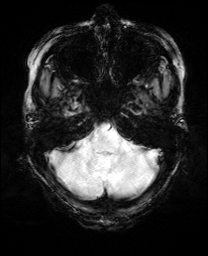
[im 30/60]
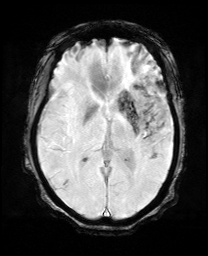
[im 45/60]
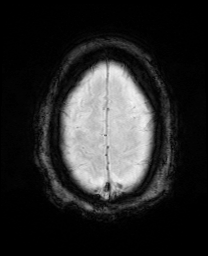
[im 60/60]
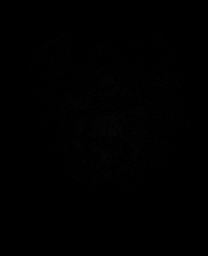

[Series 12: pha_images · axial · 3.0mm · 0.90mm/px · z∈[-109,+65]mm · 4 of 56 slices shown]
[im 1/56]
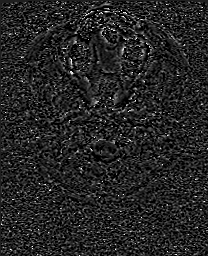
[im 19/56]
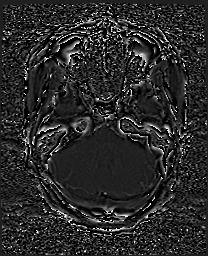
[im 37/56]
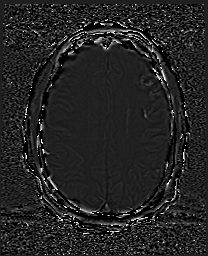
[im 56/56]
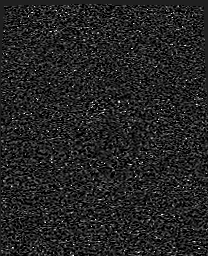

[Series 13: swi_images · axial · 3.0mm · 0.90mm/px · z∈[-109,+65]mm · 5 of 60 slices shown]
[im 1/60]
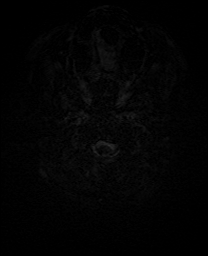
[im 15/60]
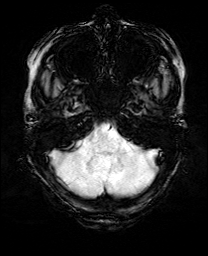
[im 30/60]
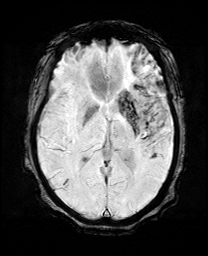
[im 45/60]
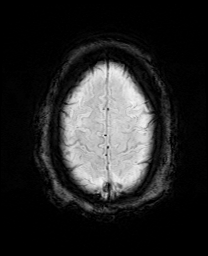
[im 60/60]
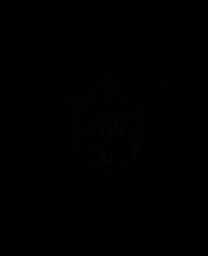

[Series 15: FLAIR · axial · 3.0mm · 0.45mm/px · z∈[-94,+50]mm · 4 of 50 slices shown]
[im 1/50]
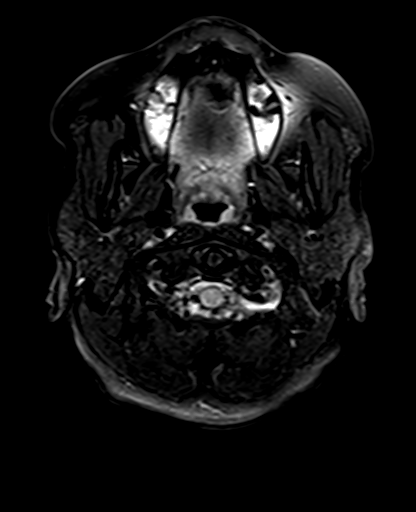
[im 17/50]
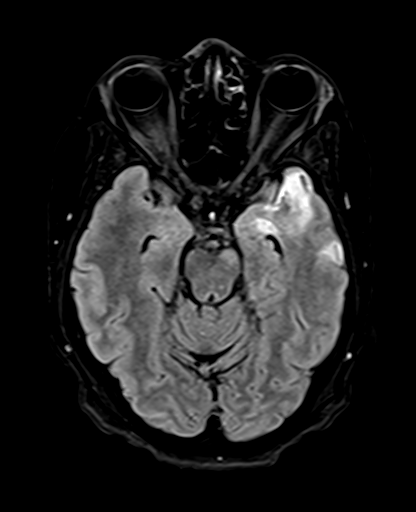
[im 33/50]
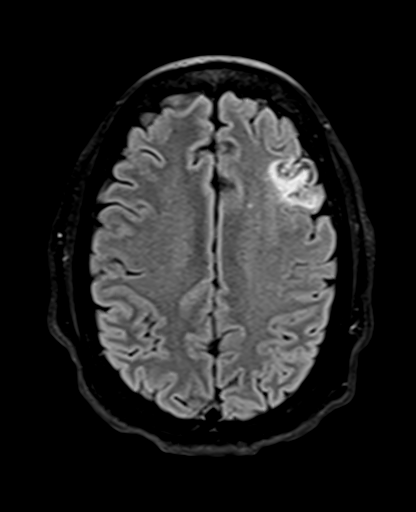
[im 50/50]
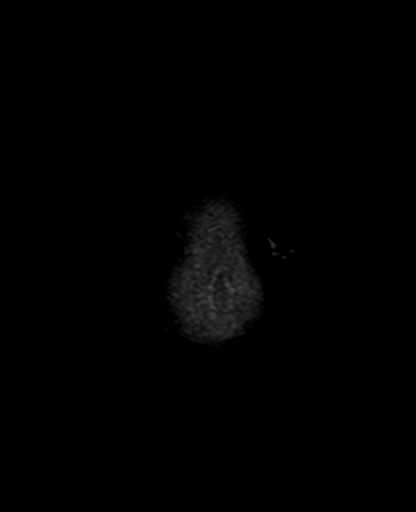

[Series 16: T1 · axial · 1.0mm · 0.98mm/px · z∈[-109,+59]mm · 13 of 172 slices shown (2 of 2)]
[im 1/172]
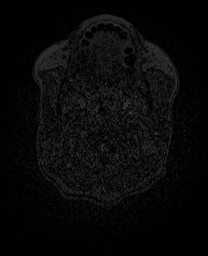
[im 15/172]
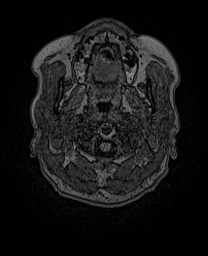
[im 29/172]
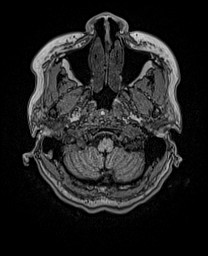
[im 43/172]
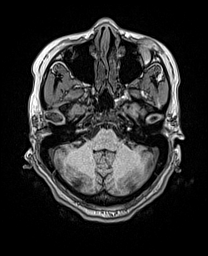
[im 58/172]
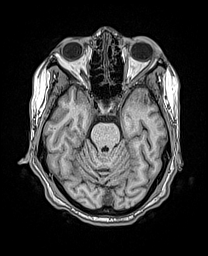
[im 72/172]
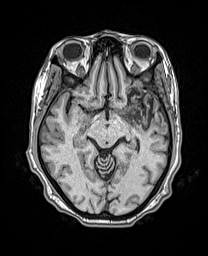
[im 86/172]
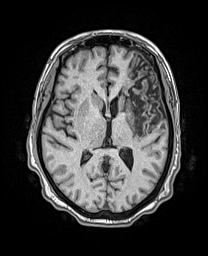
[im 100/172]
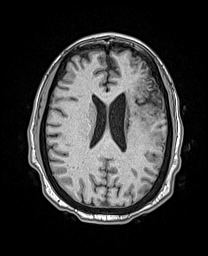
[im 115/172]
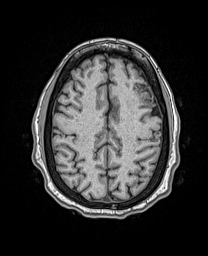
[im 129/172]
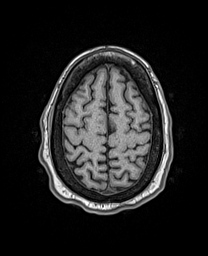
[im 143/172]
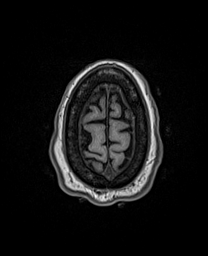
[im 157/172]
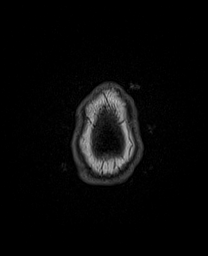
[im 172/172]
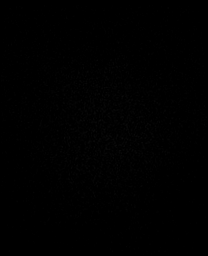

[Series 17: T2 · coronal · 5.0mm · 0.72mm/px · 2 of 28 slices shown (2 of 2)]
[im 1/28]
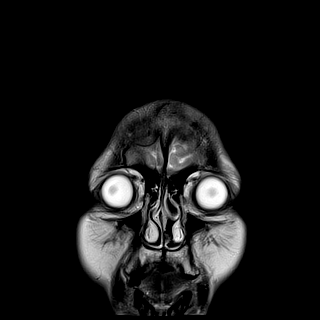
[im 28/28]
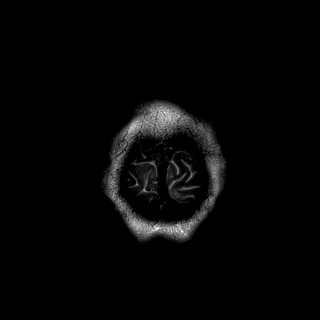

[48 of 48 positions shown; findings below may reference images not displayed]

FINDINGS: MRI HEAD

Brain: There is no acute infarction. Extensive encephalomalacia and
chronic blood products are present in the anterior/inferior left MCA
territory involving frontal lobe, insula, temporal lobe, and basal
ganglia with some parietal extension. Additional small chronic
cortical infarct in the right posterior temporal region with chronic
blood products. There is ex vacuo dilatation of the left lateral
ventricle. There is no intracranial mass, mass effect, or edema.
There is no hydrocephalus or extra-axial fluid collection.

Vascular: Better evaluated on MRA portion.

Skull and upper cervical spine: Normal marrow signal is preserved.

Sinuses/Orbits: Paranasal sinuses are aerated. Orbits are
unremarkable.

Other: Sella is unremarkable.  Mastoid air cells are clear.

MRA HEAD

Intracranial internal carotid arteries are patent. Middle and
anterior cerebral arteries are patent. Duplicated anterior
communicating artery is noted. Intracranial vertebral arteries,
basilar artery, posterior cerebral arteries are patent.

Intracranial right vertebral artery is patent. Intracranial left
vertebral artery demonstrates diminished flow related enhancement
proximally with preserved enhancement on post-contrast neck MRA.
Basilar artery and posterior cerebral arteries are patent. A left
posterior communicating artery is identified.

Aneurysms again identified arising from the right paraclinoid ICA,
communicating segment of the left ICA, and left A1-A2 junction. The
left MCA bifurcation aneurysm seen on angiogram is not identified.
Likely no substantial change in size.

MRA NECK

Common, internal, and external carotid arteries are patent. There is
no measurable stenosis at the ICA origins.

Extracranial vertebral arteries are patent. Right vertebral artery
is dominant. There is atherosclerotic irregularity of the proximal
left vertebral artery with areas of mild and moderate stenosis.
There is moderate stenosis of the proximal right V1 vertebral
artery.
IMPRESSION: No acute abnormality.  Chronic left MCA infarction.

Three of four aneurysms seen on prior catheter angiogram are again
identified and are likely without substantial change.

Extracranial vertebral artery atherosclerosis, left greater than
right.

## 2020-05-12 IMAGING — MR MR MRA HEAD W/O CM
1 series · 18 of 48 positions shown · IV contrast (gadavist)
Comparison: MRI [DATE], correlation made with CT head
[DATE], prior angiogram

CLINICAL DATA: Altered mental status, headache, history of aneurysm

EXAM:
MRI HEAD WITHOUT CONTRAST
MRA HEAD WITHOUT CONTRAST
MRA NECK WITHOUT AND WITH CONTRAST
TECHNIQUE: Multiplanar, multiecho pulse sequences of the brain and surrounding
structures were obtained without intravenous contrast. Angiographic
images of the Circle of Willis were obtained using MRA technique
without intravenous contrast. Angiographic images of the neck were
obtained using MRA technique without and with intravenous contrast.
Carotid stenosis measurements (when applicable) are obtained
utilizing NASCET criteria, using the distal internal carotid
diameter as the denominator.
CONTRAST:  9mL GADAVIST GADOBUTROL 1 MMOL/ML IV SOLN

[Series 1: TOF · axial · 0.4mm · 0.43mm/px · z∈[-95,-9]mm · 18 of 232 slices shown]
[im 1/232]
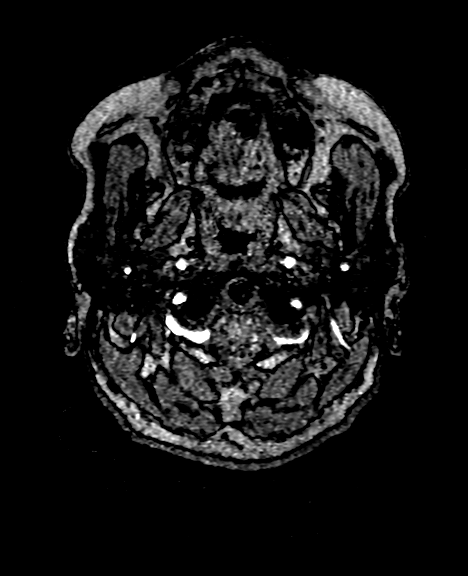
[im 5/232]
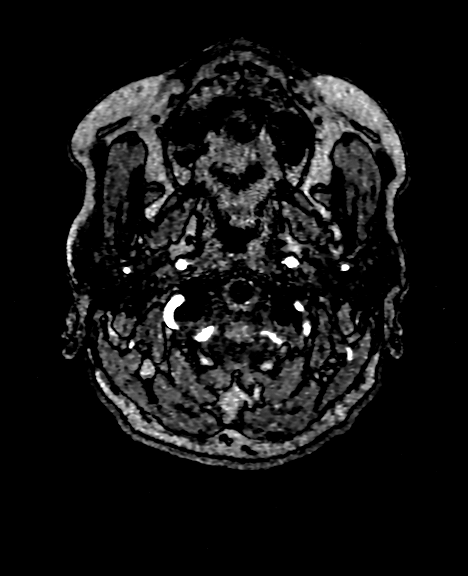
[im 10/232]
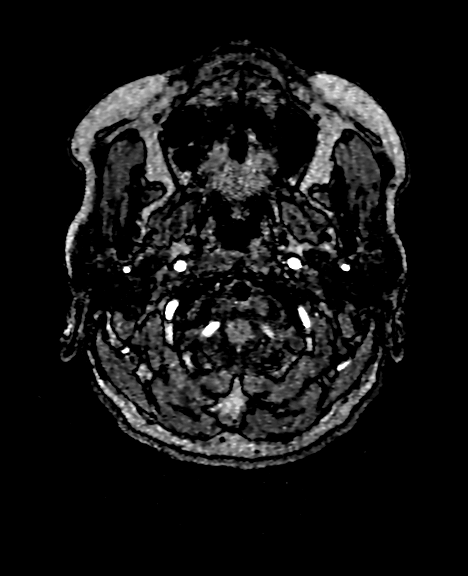
[im 15/232]
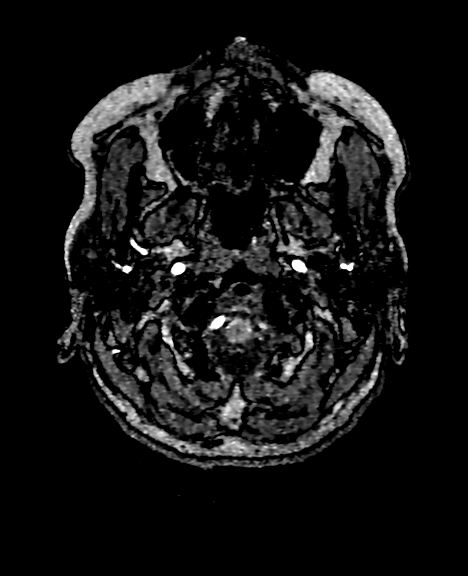
[im 20/232]
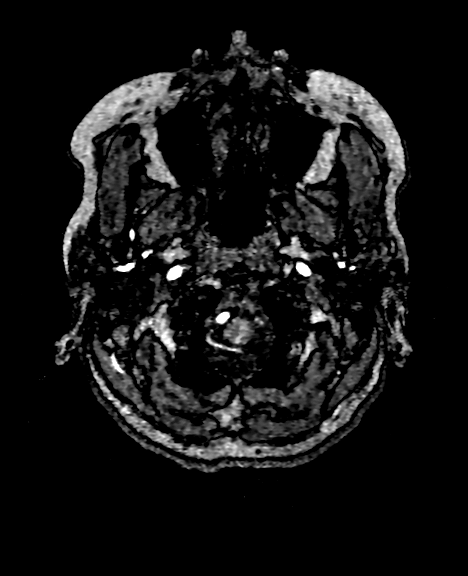
[im 25/232]
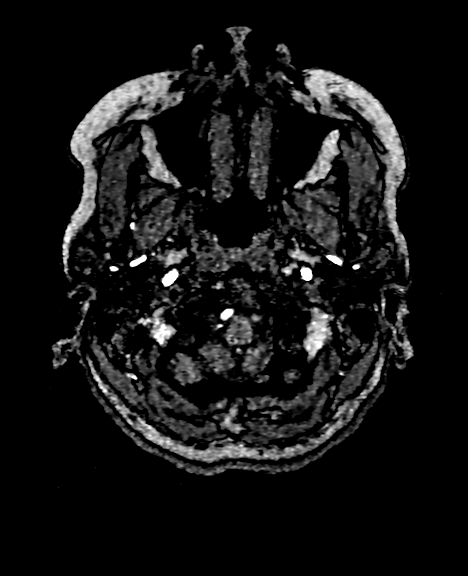
[im 30/232]
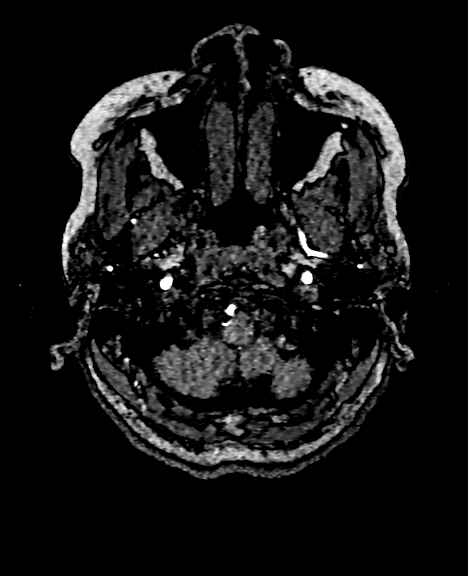
[im 35/232]
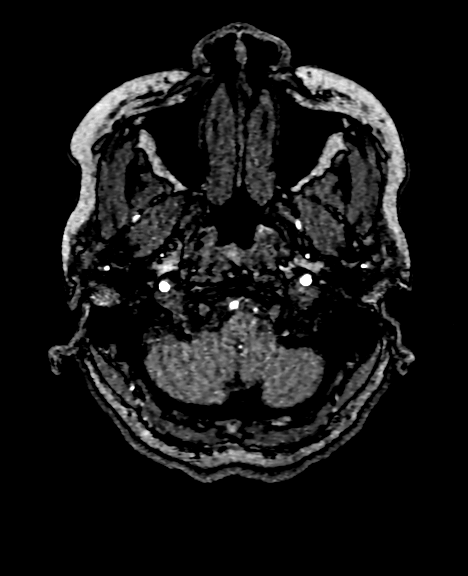
[im 40/232]
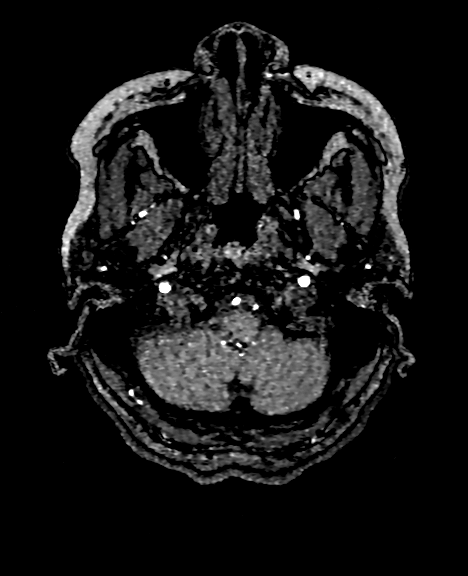
[im 45/232]
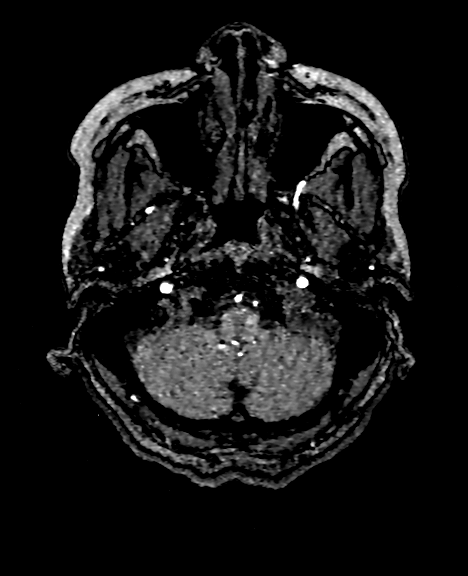
[im 74/232]
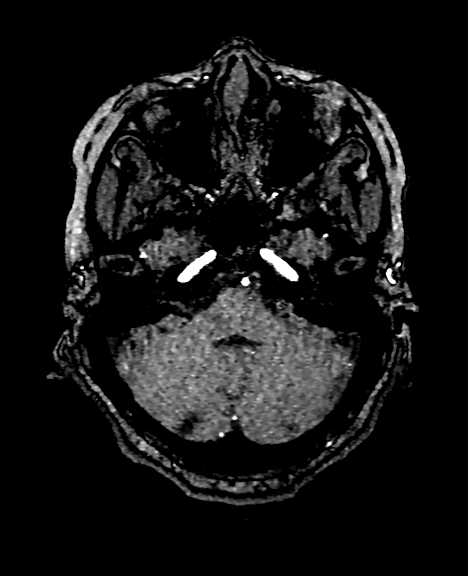
[im 104/232]
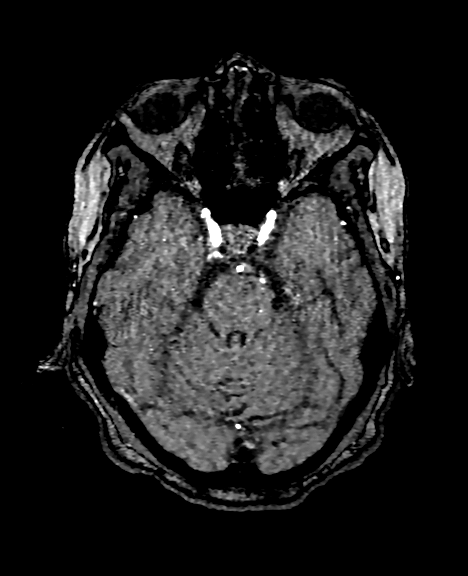
[im 118/232]
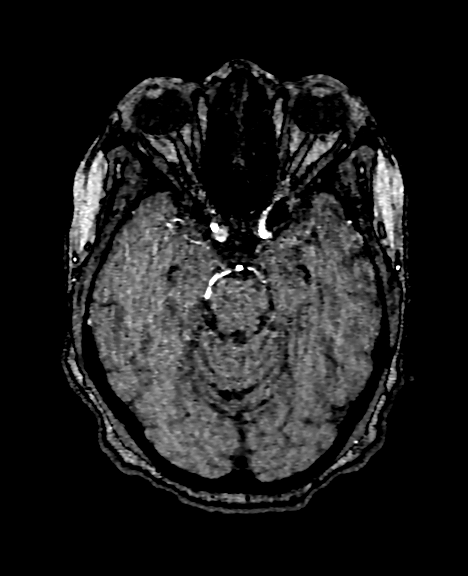
[im 133/232]
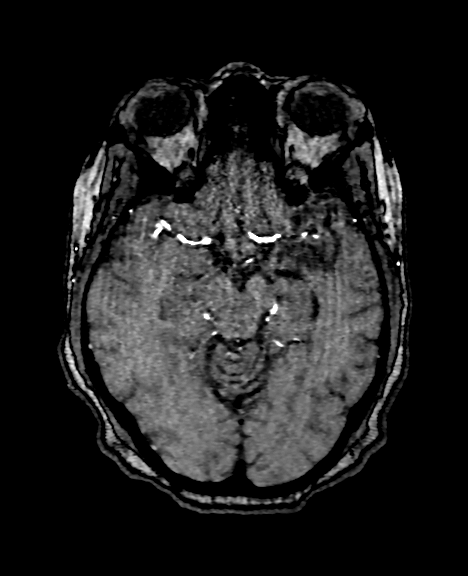
[im 163/232]
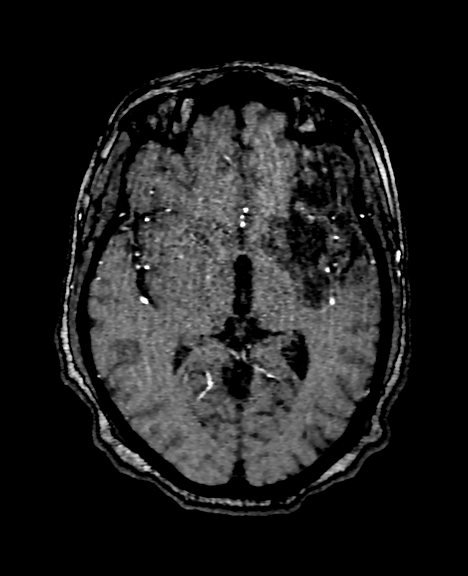
[im 192/232]
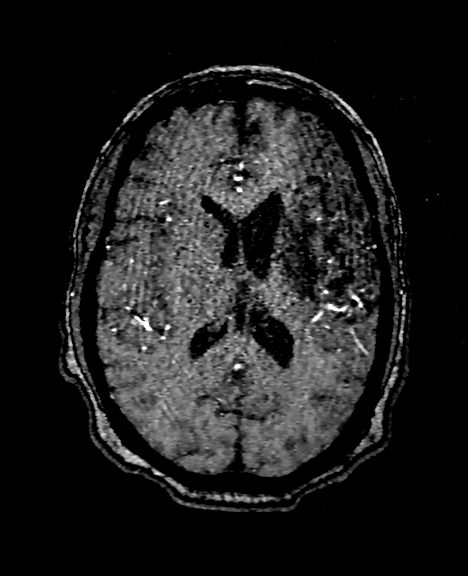
[im 197/232]
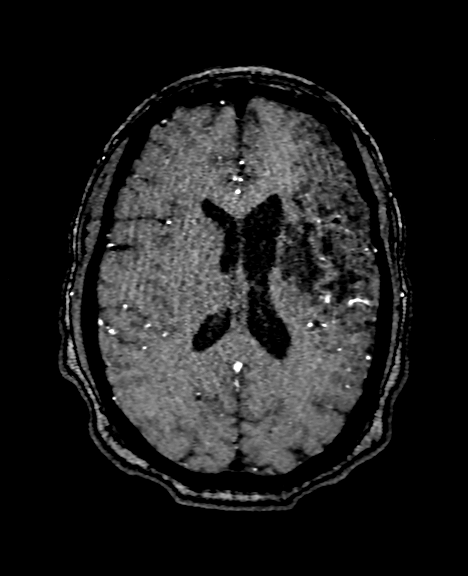
[im 222/232]
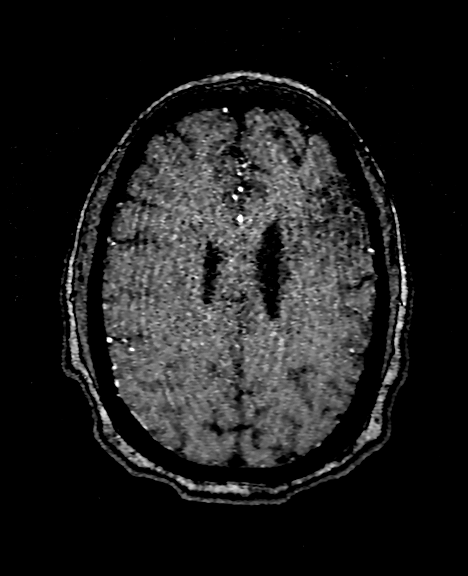

[18 of 48 positions shown; findings below may reference images not displayed]

FINDINGS: MRI HEAD

Brain: There is no acute infarction. Extensive encephalomalacia and
chronic blood products are present in the anterior/inferior left MCA
territory involving frontal lobe, insula, temporal lobe, and basal
ganglia with some parietal extension. Additional small chronic
cortical infarct in the right posterior temporal region with chronic
blood products. There is ex vacuo dilatation of the left lateral
ventricle. There is no intracranial mass, mass effect, or edema.
There is no hydrocephalus or extra-axial fluid collection.

Vascular: Better evaluated on MRA portion.

Skull and upper cervical spine: Normal marrow signal is preserved.

Sinuses/Orbits: Paranasal sinuses are aerated. Orbits are
unremarkable.

Other: Sella is unremarkable.  Mastoid air cells are clear.

MRA HEAD

Intracranial internal carotid arteries are patent. Middle and
anterior cerebral arteries are patent. Duplicated anterior
communicating artery is noted. Intracranial vertebral arteries,
basilar artery, posterior cerebral arteries are patent.

Intracranial right vertebral artery is patent. Intracranial left
vertebral artery demonstrates diminished flow related enhancement
proximally with preserved enhancement on post-contrast neck MRA.
Basilar artery and posterior cerebral arteries are patent. A left
posterior communicating artery is identified.

Aneurysms again identified arising from the right paraclinoid ICA,
communicating segment of the left ICA, and left A1-A2 junction. The
left MCA bifurcation aneurysm seen on angiogram is not identified.
Likely no substantial change in size.

MRA NECK

Common, internal, and external carotid arteries are patent. There is
no measurable stenosis at the ICA origins.

Extracranial vertebral arteries are patent. Right vertebral artery
is dominant. There is atherosclerotic irregularity of the proximal
left vertebral artery with areas of mild and moderate stenosis.
There is moderate stenosis of the proximal right V1 vertebral
artery.
IMPRESSION: No acute abnormality.  Chronic left MCA infarction.

Three of four aneurysms seen on prior catheter angiogram are again
identified and are likely without substantial change.

Extracranial vertebral artery atherosclerosis, left greater than
right.

## 2020-05-12 MED ORDER — CARVEDILOL 12.5 MG PO TABS
25.0000 mg | ORAL_TABLET | Freq: Two times a day (BID) | ORAL | Status: DC
  Administered 2020-05-12 – 2020-05-18 (×12): 25 mg via ORAL
  Filled 2020-05-12 (×13): qty 2

## 2020-05-12 MED ORDER — DIGOXIN 125 MCG PO TABS
0.1250 mg | ORAL_TABLET | Freq: Every day | ORAL | Status: DC
  Administered 2020-05-12: 0.125 mg via ORAL
  Filled 2020-05-12 (×7): qty 1

## 2020-05-12 MED ORDER — ASPIRIN EC 81 MG PO TBEC
81.0000 mg | DELAYED_RELEASE_TABLET | Freq: Every day | ORAL | Status: DC
  Administered 2020-05-12 – 2020-05-18 (×7): 81 mg via ORAL
  Filled 2020-05-12 (×7): qty 1

## 2020-05-12 MED ORDER — ENOXAPARIN SODIUM 30 MG/0.3ML ~~LOC~~ SOLN: 30.0000 mg | SUBCUTANEOUS | Status: DC

## 2020-05-12 MED ORDER — HYDRALAZINE HCL 25 MG PO TABS
25.0000 mg | ORAL_TABLET | Freq: Three times a day (TID) | ORAL | Status: DC
  Administered 2020-05-12 – 2020-05-18 (×18): 25 mg via ORAL
  Filled 2020-05-12 (×19): qty 1

## 2020-05-12 MED ORDER — MAGNESIUM SULFATE IN D5W 1-5 GM/100ML-% IV SOLN
1.0000 g | Freq: Once | INTRAVENOUS | Status: AC
  Administered 2020-05-12: 1 g via INTRAVENOUS
  Filled 2020-05-12: qty 100

## 2020-05-12 MED ORDER — POTASSIUM CHLORIDE IN NACL 20-0.45 MEQ/L-% IV SOLN
INTRAVENOUS | Status: DC
  Filled 2020-05-12 (×6): qty 1000

## 2020-05-12 MED ORDER — ONDANSETRON HCL 4 MG/2ML IJ SOLN
4.0000 mg | Freq: Four times a day (QID) | INTRAMUSCULAR | Status: DC | PRN
  Administered 2020-05-12: 4 mg via INTRAVENOUS
  Filled 2020-05-12: qty 2

## 2020-05-12 MED ORDER — SODIUM CHLORIDE 0.9 % IV SOLN
1.0000 g | INTRAVENOUS | Status: DC
  Administered 2020-05-12 – 2020-05-14 (×2): 1 g via INTRAVENOUS
  Filled 2020-05-12 (×2): qty 10

## 2020-05-12 MED ORDER — CITALOPRAM HYDROBROMIDE 10 MG PO TABS
10.0000 mg | ORAL_TABLET | Freq: Every day | ORAL | Status: DC
  Administered 2020-05-12 – 2020-05-18 (×7): 10 mg via ORAL
  Filled 2020-05-12 (×11): qty 1

## 2020-05-12 MED ORDER — TICAGRELOR 90 MG PO TABS
45.0000 mg | ORAL_TABLET | Freq: Two times a day (BID) | ORAL | Status: DC
  Administered 2020-05-12 – 2020-05-18 (×13): 45 mg via ORAL
  Filled 2020-05-12 (×13): qty 1

## 2020-05-12 MED ORDER — CEFTRIAXONE SODIUM 1 G IJ SOLR: 1.0000 g | INTRAMUSCULAR | Status: DC

## 2020-05-12 MED ORDER — POTASSIUM CHLORIDE CRYS ER 20 MEQ PO TBCR
40.0000 meq | EXTENDED_RELEASE_TABLET | Freq: Once | ORAL | Status: AC
  Administered 2020-05-12: 40 meq via ORAL
  Filled 2020-05-12: qty 2

## 2020-05-12 MED ORDER — GADOBUTROL 1 MMOL/ML IV SOLN
9.0000 mL | Freq: Once | INTRAVENOUS | Status: AC | PRN
  Administered 2020-05-12: 9 mL via INTRAVENOUS

## 2020-05-12 MED ORDER — MAGNESIUM SULFATE 2 GM/50ML IV SOLN
2.0000 g | Freq: Once | INTRAVENOUS | Status: AC
  Administered 2020-05-12: 2 g via INTRAVENOUS
  Filled 2020-05-12: qty 50

## 2020-05-12 NOTE — Evaluation (Signed)
Clinical/Bedside Swallow Evaluation Patient Details  Name: Lanelle Lindo Portillo MRN: [id number] Date of Birth: November 25, 1965  Today's Date: 05/12/2020 Time: SLP Start Time (ACUTE ONLY): 1034 SLP Stop Time (ACUTE ONLY): 1104 SLP Time Calculation (min) (ACUTE ONLY): 30 min  Past Medical History:  Past Medical History:  Diagnosis Date  . A-fib ([place])   . Allergy   . Anemia   . Arthritis   . CHF (congestive heart failure) ([place])    a. EF at 30-35% by echo in 08/2018 b. EF at 35% by repeat echo in 05/2019  . Chronic abdominal pain   . Cocaine abuse ([place])   . COPD (chronic obstructive pulmonary disease) ([place])   . Essential hypertension, benign   . Expressive aphasia   . Expressive aphasia    post CVA  . Fatty liver   . GERD (gastroesophageal reflux disease)   . Gout 2016  . Normal coronary arteries    3/10 - following abnormal Myoview  . Ovarian cyst   . Stroke ([place]) 12/26/2019   Right sided weakness, and expressive aphasia  . Thoracic ascending aortic aneurysm ([facility])    4 cm 10/31/19 CTA  . Type 2 diabetes mellitus ([place])    type II   Past Surgical History:  Past Surgical History:  Procedure Laterality Date  . ABDOMINAL HYSTERECTOMY  09/10/2011   Procedure: HYSTERECTOMY ABDOMINAL;  Surgeon: Jonnie Kind, MD;  Location: [facility];  Service: Gynecology;  Laterality: N/A;  Abdominal hysterectomy  . CESAREAN SECTION  [id number], and 1994  . CHOLECYSTECTOMY  1995  . IR 3D INDEPENDENT WKST  03/16/2020  . IR ANGIO INTRA EXTRACRAN SEL INTERNAL CAROTID BILAT MOD SED  03/16/2020  . IR ANGIO VERTEBRAL SEL SUBCLAVIAN INNOMINATE UNI L MOD SED  03/16/2020  . IR ANGIO VERTEBRAL SEL VERTEBRAL UNI R MOD SED  03/16/2020  . IR CT HEAD LTD  12/27/2019  . IR PERCUTANEOUS ART THROMBECTOMY/INFUSION INTRACRANIAL INC DIAG ANGIO  12/27/2019  . IR US GUIDE VASC ACCESS RIGHT  12/27/2019  . IR US GUIDE VASC ACCESS RIGHT  03/16/2020  . RADIOLOGY WITH ANESTHESIA N/A 12/27/2019   Procedure: IR WITH ANESTHESIA;  Surgeon:  Luanne Bras, MD;  Location: [facility];  Service: Radiology;  Laterality: N/A;  . SCAR REVISION  09/10/2011   Procedure: SCAR REVISION;  Surgeon: Jonnie Kind, MD;  Location: [facility];  Service: Gynecology;  Laterality: N/A;  Wide Excision of old Cicatrix  . TUBAL LIGATION  1994   HPI:  Pt is a 55 year old female recently admitted to acute care with acute L MCA CVA and L ICA occlusion s/p thrombectomy on 12/27/2019 followed by [facility] stay where she received ST/OT/PT until d/c home on 02/02/20. She was treated for dysphagia (she was initially placed on D1/puree and pudding thick liquids but was d/c from CIR on a regular/thin diet), she was also treated for speech and language during her stay at CIR. PMH includes: DM, Afib, CHF, polysubstance abuse, COPD, GERD, HTN. Pt arrived at the ED on 05/11/2020 with family report of AMS. BSE requested.   Assessment / Plan / Recommendation Clinical Impression  Pt consumed trials of thin liquids and mixed consistency (fruit) without overt s/sx of aspiration. Pt refused/did not want regular (crackers) Note slightly prolonged oral prep stage; Recommend initiate diet of D3/mech soft and thin liquids with standard universal aspiration precautions. There are no further ST needs noted at this time. ST to sign off, thank you for this consult.  SLP Visit Diagnosis: Dysphagia, unspecified (  R13.10)    Aspiration Risk  Mild aspiration risk    Diet Recommendation Dysphagia 3 (Mech soft);Thin liquid   Liquid Administration via: Cup;Straw Medication Administration: Whole meds with liquid Supervision: Patient able to self feed;Intermittent supervision to cue for compensatory strategies Compensations: Minimize environmental distractions;Slow rate;Small sips/bites Postural Changes: Seated upright at 90 degrees;Remain upright for at least 30 minutes after po intake    Other  Recommendations Oral Care Recommendations: Oral care BID   Follow up Recommendations None       Frequency and Duration            Prognosis Prognosis for Safe Diet Advancement: [place] Date of Onset: 05/11/20 HPI: Pt is a 54 year old female recently admitted to acute care with acute L MCA CVA and L ICA occlusion s/p thrombectomy on 12/27/2019 followed by [facility] stay where she received ST/OT/PT until d/c home on 02/02/20. She was treated for dysphagia (she was initially placed on D1/puree and pudding thick liquids but was d/c from CIR on a regular/thin diet), she was also treated for speech and language during her stay at CIR. PMH includes: DM, Afib, CHF, polysubstance abuse, COPD, GERD, HTN. Pt arrived at the ED on 05/11/2020 with family report of AMS. BSE requested. Type of Study: Bedside Swallow Evaluation Previous Swallow Assessment: most recent MBSS 01/14/20 Diet Prior to this Study: NPO Temperature Spikes Noted: No Respiratory Status: Room air History of Recent Intubation: No Behavior/Cognition: Alert;Cooperative;Pleasant mood;Confused Oral Cavity Assessment: Dry Oral Care Completed by SLP: Recent completion by staff Oral Cavity - Dentition: Adequate natural dentition Vision: Functional for self-feeding Self-Feeding Abilities: Able to feed self Patient Positioning: Upright in bed Baseline Vocal Quality: Normal Volitional Cough: Cognitively unable to elicit Volitional Swallow: Unable to elicit    Oral/Motor/Sensory Function Overall Oral Motor/Sensory Function: Within functional limits   Ice Chips Ice chips: Within functional limits   Thin Liquid Thin Liquid: Within functional limits    Nectar Thick Nectar Thick Liquid: Not tested   Honey Thick Honey Thick Liquid: Not tested   Puree Puree: Within functional limits   Solid     Solid: Within functional limits     Tayva Easterday H. Roddie Mc, [place] Speech Language Pathologist  Wende Bushy 05/12/2020,11:18 AM

## 2020-05-12 NOTE — ED Notes (Signed)
Pt gone to MRI 

## 2020-05-12 NOTE — Plan of Care (Signed)
  Problem: Acute Rehab PT Goals(only PT should resolve) Goal: Pt Will Go Supine/Side To Sit Outcome: Progressing Flowsheets (Taken 05/12/2020 1230) Pt will go Supine/Side to Sit: with supervision Goal: Patient Will Transfer Sit To/From Stand Outcome: Progressing Flowsheets (Taken 05/12/2020 1230) Patient will transfer sit to/from stand: with supervision Goal: Pt Will Transfer Bed To Chair/Chair To Bed Outcome: Progressing Flowsheets (Taken 05/12/2020 1230) Pt will Transfer Bed to Chair/Chair to Bed: with supervision Goal: Pt Will Ambulate Outcome: Progressing Flowsheets (Taken 05/12/2020 1230) Pt will Ambulate:  50 feet  with supervision  with least restrictive assistive device   12:31 PM, 05/12/20 Lonell Grandchild, MPT Physical Therapist with [facility] [phone] office [phone] mobile phone

## 2020-05-12 NOTE — Progress Notes (Signed)
PROGRESS NOTE    Robin [name]  HLK:[id number] DOB: [birth year] DOA: 05/11/2020 PCP: Inda Coke, PA   Brief Narrative:  Per HPI: Robin [name] is a 54 y.o. female with medical history significant for DM, HTN, atrial fibrillation,  anemia, CHF, COPD, ascending aortic aneurysm, CVA with residual right-sided weakness and impaired speech who presents to the  emergency department via EMS due to altered mental status. Patient was unable to provide history, history was obtained from sister at bedside and ED physician, as well as ED medical chart. Per report. Patient was reported to have become altered within last 2 days with decreased eating or drinking of water, she was also reported to complain of right-sided headache within last week. She has an appointment on 10/13 for embolization of her brain aneurysms with Dr Estanislado Pandy. There was no report of fever, chills, chest pain, shortness of breath, nausea, vomiting or diarrhea.  10/8: Patient continues to have some altered mentation with what appears to be a UTI.  Brain MRI with no acute findings of CVA noted and 2D echocardiogram with LVEF 70-75%.  Assessment & Plan:   Principal Problem:   Altered mental status Active Problems:   COPD (chronic obstructive pulmonary disease) (HCC)   Essential hypertension, benign   Hypokalemia   AKI (acute kidney injury) ([place])   Right hemiparesis (HCC)   UTI (urinary tract infection)   Prolonged QT interval   History of CVA (cerebrovascular accident)   Atrial fibrillation, chronic (HCC)   History of CHF (congestive heart failure)   Hypomagnesemia   Altered metabolic encephalopathy likely related to UTI -Brain MRI with no acute findings of CVA noted -Urine analysis with signs of pyuria suspicious for UTI with cultures pending -Continue Rocephin for now -TSH 1.6 -2D echocardiogram 70-75% -PT evaluation pending -Patient started on dysphagia 3 diet -Continue ongoing management as ordered with  potential need for placement  Presumed UTI POA Urinalysis was positive for large leukocytes and WBC >50 but rare bacteria Patient was unable to provide any history regarding irritative bladder symptoms She was presumed to have UTI and was started on IV ceftriaxone in the ED  Hypomagnesemia/hypokalemia -Replete and continue reevaluation  Prolonged QTc QTc 632ms Avoid QT prolonging drugs Potassium was replenished Magnesium will be replenished  Repeat EKG in the morning  Acute kidney injury BUN to creatinine 21/2.78 (baseline creatinine 1.1-1.3). Renally adjust medications, avoid nephrotoxic agents/dehydration/hypotension -Continue on aggressive IV fluid and recheck labs in a.m. -Check urine electrolytes and renal ultrasound -Monitor I's and O's  COPD (not in acute exacerbation) No medication against COPD noted on med rec -No acute bronchospasms noted  History of CVA with residual right-sided weakness Home meds will be resumed once patient is cleared for oral intake  A. fib with rate control EKG presents with A. fib with rate control Continue telemetry Resume home Coreg  Essential hypertension (controlled) Continue home blood pressure agents  History of CHF 2D echocardiogram with LVEF 70-75% and no other acute findings   DVT prophylaxis:SCDs Code Status: Full code Family Communication: Tried calling sister on 10/8 with no response Disposition Plan:  Status is: Inpatient  Remains inpatient appropriate because:Altered mental status, IV treatments appropriate due to intensity of illness or inability to take PO and Inpatient level of care appropriate due to severity of illness   Dispo: The patient is from: Home              Anticipated d/c is to: SNF  Anticipated d/c date is: 2 days              Patient currently is not medically stable to d/c.  Patient continues to have some ongoing confusion and needs ongoing treatment for UTI.   Consultants:    Neurology  Procedures:   See below  Antimicrobials:  Anti-infectives (From admission, onward)   Start     Dose/Rate Route Frequency Ordered Stop   05/12/20 2200  cefTRIAXone (ROCEPHIN) injection 1 g        1 g Intramuscular Every 24 hours 05/12/20 0244     05/11/20 2345  cefTRIAXone (ROCEPHIN) 1 g in sodium chloride 0.9 % 100 mL IVPB        1 g 200 mL/hr over 30 Minutes Intravenous  Once 05/11/20 2344 05/12/20 0549       Subjective: Patient seen and evaluated today with ongoing confusion noted.  She denies any other pain concerns  Objective: Vitals:   05/11/20 2330 05/12/20 0053 05/12/20 0530 05/12/20 0600  BP: 109/66  114/65 122/79  Pulse: 66     Resp: (!) 21  19 16   Temp:      TempSrc:      SpO2: 99% 99%    Weight:      Height:       No intake or output data in the 24 hours ending 05/12/20 0654 Filed Weights   05/11/20 1953  Weight: 90.7 kg    Examination:  General exam: Appears calm and comfortable, confused Respiratory system: Clear to auscultation. Respiratory effort normal. Cardiovascular system: S1 & S2 heard, RRR.  Gastrointestinal system: Abdomen is nondistended, soft and nontender.  Central nervous system: Alert and awake Extremities: No edema Skin: No rashes, lesions or ulcers Psychiatry: Flat affect    Data Reviewed: I have personally reviewed following labs and imaging studies  CBC: Recent Labs  Lab 05/11/20 2016 05/12/20 0313  WBC 10.8* 8.8  HGB 11.6* 10.0*  HCT 34.8* 29.5*  MCV 84.3 84.3  PLT 333 831   Basic Metabolic Panel: Recent Labs  Lab 05/11/20 2016 05/12/20 0313  NA 134* 135  K 2.9* 3.2*  CL 96* 103  CO2 23 20*  GLUCOSE 123* 114*  BUN 21* 19  CREATININE 2.78* 2.38*  CALCIUM 8.7* 7.7*  MG 1.3* 1.2*  PHOS  --  2.2*   GFR: Estimated Creatinine Clearance: 31.6 mL/min (A) (by C-G formula based on SCr of 2.38 mg/dL (H)). Liver Function Tests: Recent Labs  Lab 05/12/20 0313  AST 9*  ALT 6  ALKPHOS 50  BILITOT  0.7  PROT 6.3*  ALBUMIN 3.0*   No results for input(s): LIPASE, AMYLASE in the last 168 hours. No results for input(s): AMMONIA in the last 168 hours. Coagulation Profile: Recent Labs  Lab 05/12/20 0313  INR 1.5*   Cardiac Enzymes: No results for input(s): CKTOTAL, CKMB, CKMBINDEX, TROPONINI in the last 168 hours. BNP (last 3 results) No results for input(s): PROBNP in the last 8760 hours. HbA1C: No results for input(s): HGBA1C in the last 72 hours. CBG: Recent Labs  Lab 05/11/20 2057  GLUCAP 138*   Lipid Profile: No results for input(s): CHOL, HDL, LDLCALC, TRIG, CHOLHDL, LDLDIRECT in the last 72 hours. Thyroid Function Tests: Recent Labs    05/11/20 2016  TSH 1.683   Anemia Panel: Recent Labs    05/11/20 2016  VITAMINB12 244   Sepsis Labs: No results for input(s): PROCALCITON, LATICACIDVEN in the last 168 hours.  Recent Results (from the  past 240 hour(s))  Respiratory Panel by RT PCR (Flu A&B, Covid) - Nasopharyngeal Swab     Status: None   Collection Time: 05/11/20 11:59 PM   Specimen: Nasopharyngeal Swab  Result Value Ref Range Status   SARS Coronavirus 2 by RT PCR NEGATIVE NEGATIVE Final    Comment: (NOTE) SARS-CoV-2 target nucleic acids are NOT DETECTED.  The SARS-CoV-2 RNA is generally detectable in upper respiratoy specimens during the acute phase of infection. The lowest concentration of SARS-CoV-2 viral copies this assay can detect is 131 copies/mL. A negative result does not preclude SARS-Cov-2 infection and should not be used as the sole basis for treatment or other patient management decisions. A negative result may occur with  improper specimen collection/handling, submission of specimen other than nasopharyngeal swab, presence of viral mutation(s) within the areas targeted by this assay, and inadequate number of viral copies (<131 copies/mL). A negative result must be combined with clinical observations, patient history, and epidemiological  information. The expected result is Negative.  Fact Sheet for Patients:  PinkCheek.be  Fact Sheet for Healthcare Providers:  GravelBags.it  This test is no t yet approved or cleared by the [place] FDA and  has been authorized for detection and/or diagnosis of SARS-CoV-2 by FDA under an Emergency Use Authorization (EUA). This EUA will remain  in effect (meaning this test can be used) for the duration of the COVID-19 declaration under Section 564(b)(1) of the Act, 21 U.S.C. section 360bbb-3(b)(1), unless the authorization is terminated or revoked sooner.     Influenza A by PCR NEGATIVE NEGATIVE Final   Influenza B by PCR NEGATIVE NEGATIVE Final    Comment: (NOTE) The Xpert Xpress SARS-CoV-2/FLU/RSV assay is intended as an aid in  the diagnosis of influenza from Nasopharyngeal swab specimens and  should not be used as a sole basis for treatment. Nasal washings and  aspirates are unacceptable for Xpert Xpress SARS-CoV-2/FLU/RSV  testing.  Fact Sheet for Patients: PinkCheek.be  Fact Sheet for Healthcare Providers: GravelBags.it  This test is not yet approved or cleared by the [place] FDA and  has been authorized for detection and/or diagnosis of SARS-CoV-2 by  FDA under an Emergency Use Authorization (EUA). This EUA will remain  in effect (meaning this test can be used) for the duration of the  Covid-19 declaration under Section 564(b)(1) of the Act, 21  U.S.C. section 360bbb-3(b)(1), unless the authorization is  terminated or revoked. Performed at [facility], [address], [place], [place] [address]          Radiology Studies: CT Head Wo Contrast  Result Date: 05/11/2020 CLINICAL DATA:  Right arm deficit EXAM: CT HEAD WITHOUT CONTRAST TECHNIQUE: Contiguous axial images were obtained from the base of the skull through the vertex without  intravenous contrast. COMPARISON:  March 19, 2020 FINDINGS: Brain: Large area of encephalomalacia involving the left frontotemporal lobe as on prior exam. There is ex vacuo dilatation of the anterior horn of the left lateral ventricle. No acute loss of gray-white differentiation or extra-axial collections are seen. There is mild low-attenuation changes in the deep white matter consistent with small vessel ischemia. Ventricles are normal in size and contour. Vascular: No hyperdense vessel or unexpected calcification. Skull: The skull is intact. No fracture or focal lesion identified. Sinuses/Orbits: The visualized paranasal sinuses and mastoid air cells are clear. The orbits and globes intact. Other: None IMPRESSION: No acute intracranial abnormality. Stable area of encephalomalacia involving the left frontotemporal lobe. Electronically Signed   By: Kerby Moors  Avutu M.D.   On: 05/11/2020 20:46   DG Chest Portable 1 View  Result Date: 05/11/2020 CLINICAL DATA:  Change in mental status EXAM: PORTABLE CHEST 1 VIEW COMPARISON:  Dec 27, 2019 FINDINGS: The heart size and mediastinal contours are within normal limits. Both lungs are clear. The visualized skeletal structures are unremarkable. IMPRESSION: No active disease. Electronically Signed   By: Prudencio Pair M.D.   On: 05/11/2020 20:41        Scheduled Meds: . cefTRIAXone (ROCEPHIN) IM  1 g Intramuscular Q24H     LOS: 1 day    Time spent: 35 minutes    Briahna Pescador Darleen Crocker, DO [facility]  If 7PM-7AM, please contact night-coverage [email or web address] 05/12/2020, 6:54 AM

## 2020-05-12 NOTE — Evaluation (Signed)
Physical Therapy Evaluation Patient Details Name: Robin [name] MRN: [id number] DOB: 12-Aug-1965 Today's Date: 05/12/2020   History of Present Illness  Robin [name] is a 54 y.o. female with medical history significant for DM, HTN, atrial fibrillation,  anemia, CHF, COPD, ascending aortic aneurysm, CVA with residual right-sided weakness and impaired speech who presents to the  emergency department via EMS due to altered mental status. Patient was unable to provide history, history was obtained from sister at bedside and ED physician, as well as ED medical chart. Per report. Patient was reported to have become altered within last 2 days with decreased eating or drinking of water, she was also reported to complain of right-sided headache within last week. She has an appointment on 10/13 for embolization of her brain aneurysms with Dr Estanislado Pandy. There was no report of fever, chills, chest pain, shortness of breath, nausea, vomiting or diarrhea.    Clinical Impression  Patient demonstrates slightly labored movement for sitting up at bedside, sit to stands and transferring to chair, unsteady on feet requiring occasional hand held assist during ambulation, limited mostly due to c/o fatigue and requested to go back to bed after therapy.  Patient will benefit from continued physical therapy in hospital and recommended venue below to increase strength, balance, endurance for safe ADLs and gait.    Follow Up Recommendations SNF;Supervision - Intermittent;Supervision for mobility/OOB    Equipment Recommendations  Other (comment)    Recommendations for Other Services       Precautions / Restrictions Precautions Precautions: Fall Restrictions Weight Bearing Restrictions: No      Mobility  Bed Mobility Overal bed mobility: Needs Assistance Bed Mobility: Supine to Sit;Sit to Supine     Supine to sit: Min assist Sit to supine: Min assist   General bed mobility comments: increased time, labored  movement  Transfers Overall transfer level: Needs assistance Equipment used: 1 [place] hand held assist Transfers: Sit to/from [facility] Sit to Stand: Min guard Stand pivot transfers: Min guard       General transfer comment: slow labored movement  Ambulation/Gait Ambulation/Gait assistance: Min guard;Min assist Gait Distance (Feet): 25 Feet Assistive device: 1 [place] hand held assist Gait Pattern/deviations: Decreased step length - left;Decreased stance time - right;Decreased stride length Gait velocity: decreased   General Gait Details: unsteady slightly labored cadence without loss of balance, limited secondary to c/o fatigue  Stairs            Wheelchair Mobility    Modified Rankin (Stroke Patients Only)       Balance Overall balance assessment: Needs assistance Sitting-balance support: Feet supported;No upper extremity supported Sitting balance-Leahy Scale: Good Sitting balance - Comments: seated at EOB   Standing balance support: No upper extremity supported;During functional activity Standing balance-Leahy Scale: Fair Standing balance comment: hand held assist                             Pertinent Vitals/Pain Pain Assessment: No/denies pain    Home Living Family/patient expects to be discharged to:: Private residence Living Arrangements: Children Available Help at Discharge: Family;Available PRN/intermittently Type of Home: House Home Access: Ramped entrance     Home Layout: One level Home Equipment: Cane - single point;Walker - 2 wheels;Wheelchair - manual;Shower seat;Bedside commode      Prior Function Level of Independence: Independent with assistive device(s)         Comments: household ambulator using [place]  Hand Dominance   Dominant Hand: Left    Extremity/Trunk Assessment   Upper Extremity Assessment Upper Extremity Assessment: Defer to OT evaluation    Lower Extremity Assessment Lower  Extremity Assessment: Generalized weakness    Cervical / Trunk Assessment Cervical / Trunk Assessment: Normal  Communication   Communication: Expressive difficulties  Cognition Arousal/Alertness: Awake/alert Behavior During Therapy: WFL for tasks assessed/performed Overall Cognitive Status: Difficult to assess                                 General Comments: Patient has expressive aphasia and unable to answer some questions      General Comments      Exercises     Assessment/Plan    PT Assessment Patient needs continued PT services  PT Problem List Decreased strength;Decreased activity tolerance;Decreased balance;Decreased mobility       PT Treatment Interventions DME instruction;Gait training;Stair training;Functional mobility training;Therapeutic activities;Therapeutic exercise;Balance training;Patient/family education    PT Goals (Current goals can be found in the Care Plan section)  Acute Rehab PT Goals Patient Stated Goal: return home with family to assist PT Goal Formulation: With patient Time For Goal Achievement: 05/26/20 Potential to Achieve Goals: Good    Frequency Min 3X/week   Barriers to discharge        Co-evaluation               AM-PAC PT "6 Clicks" Mobility  Outcome Measure Help needed turning from your back to your side while in a flat bed without using bedrails?: A Little Help needed moving from lying on your back to sitting on the side of a flat bed without using bedrails?: A Little Help needed moving to and from a bed to a chair (including a wheelchair)?: A Little Help needed standing up from a chair using your arms (e.g., wheelchair or bedside chair)?: A Little Help needed to walk in hospital room?: A Little Help needed climbing 3-5 steps with a railing? : A Lot 6 Click Score: 17    End of Session   Activity Tolerance: Patient tolerated treatment well;Patient limited by fatigue Patient left: in bed;with call  bell/phone within reach Nurse Communication: Mobility status PT Visit Diagnosis: Unsteadiness on feet (R26.81);Other abnormalities of gait and mobility (R26.89);Muscle weakness (generalized) (M62.81)    Time: 2951-8841 PT Time Calculation (min) (ACUTE ONLY): 28 min   Charges:   PT Evaluation $PT Eval Moderate Complexity: 1 Mod PT Treatments $Therapeutic Activity: 23-37 mins        12:26 PM, 05/12/20 Lonell Grandchild, MPT Physical Therapist with [facility] [phone] office [phone] mobile phone

## 2020-05-12 NOTE — Progress Notes (Signed)
EEG Completed; Results Pending  

## 2020-05-12 NOTE — ED Notes (Signed)
    Robin [name] requests that no one get information on patient but her.
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
## 2020-05-12 NOTE — ED Notes (Signed)
PT in to eval pt
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
## 2020-05-12 NOTE — ED Notes (Signed)
Family updated about room change

## 2020-05-12 NOTE — Progress Notes (Signed)
*  PRELIMINARY RESULTS* Echocardiogram Limited 2-D Echocardiogram has been performed.  Robin [name] 05/12/2020, 9:20 AM

## 2020-05-12 NOTE — Consult Note (Signed)
Cranston A. Merlene Laughter, MD     [email or web address]          Robin [name] is an 54 y.o. female.   ASSESSMENT/PLAN: 1.  Multifactorial toxic metabolic encephalopathy with etiologies including acute UTI, dehydration and acute renal failure.  This has improved with the appropriate treatment. 2.  Severe aphasia with mostly expression with right hemiparesis from prior left MCA infarct.  An EEG will be obtained to evaluate for possible post stroke seizure. 3.  Right intracranial ICA aneurysm apparently due for coiling/stenting next week currently on dual antiplatelet agents in anticipation for this procedure. 4.  Atrial fibrillation    This is a 54 year old black female who presents with a 3-day history of worsening cognitive impairment.  The family reports that she has become less unresponsive over the last 3 days.  Her speech is also gotten worse.  She has a history of an left MCA infarct with severe expressive aphasia and right hemiparesis.  The patient is on chronic anticoagulation with Eliquis.  She is due to have coiling of narrowing right intracranial ICA aneurysm next week and was switched to dual antiplatelet agents.  Patient has been noted to have multiple metabolic derangements admission and this is currently being treated.  The family does not report loss of consciousness or evidence of convulsion.  Right-sided weakness seems to be unchanged.  No other new areas of focal deficits are noted.  No reports of chest pain, palpitation or shortness of breath.  Review of systems otherwise negative.    GENERAL: This is a pleasant overweight female who is doing well at this time.  The sister is at the bedside and helps with the history.  HEENT: No trauma noted and neck is supple.  ABDOMEN: soft  EXTREMITIES: No edema   BACK: Normal  SKIN: Normal by inspection.    MENTAL STATUS: She is awake and alert and cooperative with the evaluation.  She is all but mute due to  severe speech impairment/aphasia.  There is some perseveration at times.  She does have relatively preserved comprehension and follows commands briskly.  CRANIAL NERVES: Pupils are equal, round and reactive to light and accomodation; extra ocular movements are full, there is no significant nystagmus; visual fields are full; upper and lower facial muscles are normal in strength and symmetric, there is no flattening of the nasolabial folds; tongue is midline.  MOTOR: Right upper extremity strength is 3/5 and is associated with some pain involving the right shoulder.  Right lower extremity strength is also 3/5 proximally and distally.  Left upper and lower extremities proximally and distally are 4+/5.  COORDINATION: No tremors, dysmetria or myoclonus is noted.  No parkinsonian findings.  REFLEXES: Deep tendon reflexes are symmetrical and normal but significantly diminished in the legs.   SENSATION: Normal to light touch, temperature, and pain.       Blood pressure 126/69, pulse 71, temperature 98.8 F (37.1 C), temperature source Oral, resp. rate 17, height 5\' 7"  (1.702 m), weight 90.7 kg, last menstrual period 08/21/2011, SpO2 100 %.  Past Medical History:  Diagnosis Date  . A-fib ([place])   . Allergy   . Anemia   . Arthritis   . CHF (congestive heart failure) ([place])    a. EF at 30-35% by echo in 08/2018 b. EF at 35% by repeat echo in 05/2019  . Chronic abdominal pain   . Cocaine abuse ([place])   . COPD (chronic obstructive pulmonary disease) ([place])   . Essential  hypertension, benign   . Expressive aphasia   . Expressive aphasia    post CVA  . Fatty liver   . GERD (gastroesophageal reflux disease)   . Gout 2016  . Normal coronary arteries    3/10 - following abnormal Myoview  . Ovarian cyst   . Stroke ([place]) 12/26/2019   Right sided weakness, and expressive aphasia  . Thoracic ascending aortic aneurysm ([facility])    4 cm 10/31/19 CTA  . Type 2 diabetes mellitus ([place])    type II     Past Surgical History:  Procedure Laterality Date  . ABDOMINAL HYSTERECTOMY  09/10/2011   Procedure: HYSTERECTOMY ABDOMINAL;  Surgeon: Jonnie Kind, MD;  Location: [facility];  Service: Gynecology;  Laterality: N/A;  Abdominal hysterectomy  . CESAREAN SECTION  [id number], and 1994  . CHOLECYSTECTOMY  1995  . IR 3D INDEPENDENT WKST  03/16/2020  . IR ANGIO INTRA EXTRACRAN SEL INTERNAL CAROTID BILAT MOD SED  03/16/2020  . IR ANGIO VERTEBRAL SEL SUBCLAVIAN INNOMINATE UNI L MOD SED  03/16/2020  . IR ANGIO VERTEBRAL SEL VERTEBRAL UNI R MOD SED  03/16/2020  . IR CT HEAD LTD  12/27/2019  . IR PERCUTANEOUS ART THROMBECTOMY/INFUSION INTRACRANIAL INC DIAG ANGIO  12/27/2019  . IR US GUIDE VASC ACCESS RIGHT  12/27/2019  . IR US GUIDE VASC ACCESS RIGHT  03/16/2020  . RADIOLOGY WITH ANESTHESIA N/A 12/27/2019   Procedure: IR WITH ANESTHESIA;  Surgeon: Luanne Bras, MD;  Location: [facility];  Service: Radiology;  Laterality: N/A;  . SCAR REVISION  09/10/2011   Procedure: SCAR REVISION;  Surgeon: Jonnie Kind, MD;  Location: [facility];  Service: Gynecology;  Laterality: N/A;  Wide Excision of old Cicatrix  . TUBAL LIGATION  1994    Family History  Problem Relation Age of Onset  . Cirrhosis Mother   . Early death Mother   . Alcohol abuse Mother   . Diabetes type II Father   . Alcohol abuse Father   . Diabetes type II Sister   . Early death Brother   . Alcohol abuse Son   . Anesthesia problems Neg Hx   . Hypotension Neg Hx   . Malignant hyperthermia Neg Hx   . Pseudochol deficiency Neg Hx   . Colon cancer Neg Hx   . Colon polyps Neg Hx   . Esophageal cancer Neg Hx   . Rectal cancer Neg Hx   . Stomach cancer Neg Hx     Social History:  reports that she quit smoking about 5 months ago. Her smoking use included cigarettes. She quit after 29.00 years of use. She has never used smokeless tobacco. She reports previous alcohol use. She reports previous drug use. Drugs: Cocaine and Marijuana.  Allergies:   Allergies  Allergen Reactions  . Bee Venom Shortness Of Breath and Swelling    Bodily Swelling  . Losartan Other (See Comments)    Nosebleeds per patient report.   . Naproxen Other (See Comments)    Acid reflux  . Penicillins Nausea Only    Has patient had a PCN reaction causing immediate rash, facial/tongue/throat swelling, SOB or lightheadedness with hypotension: no Has patient had a PCN reaction causing severe rash involving mucus membranes or skin necrosis: no Has patient had a PCN reaction that required hospitalization no Has patient had a PCN reaction occurring within the last 10 years: no If all of the above answers are "NO", then may proceed with Cephalosporin   . Lisinopril  Sinus congestion    Medications: Prior to Admission medications   Medication Sig Start Date End Date Taking? Authorizing Provider  acetaminophen (TYLENOL) 650 MG CR tablet Take 650 mg by mouth every 8 (eight) hours as needed for pain.   Yes [provider]  aspirin EC 81 MG tablet Take 81 mg by mouth daily. Swallow whole.   Yes [provider]  carvedilol (COREG) 25 MG tablet Take 1 tablet (25 mg total) by mouth 2 (two) times daily with a meal. 02/29/20  Yes Inda Coke, PA  citalopram (CELEXA) 10 MG tablet Take 1 tablet by mouth once daily Patient taking differently: Take 10 mg by mouth daily.  05/03/20  Yes Inda Coke, PA  digoxin (LANOXIN) 0.125 MG tablet Take 1 tablet (0.125 mg total) by mouth daily. 02/29/20  Yes Inda Coke, PA  gabapentin (NEURONTIN) 100 MG capsule Take 1 capsule (100 mg total) by mouth 3 (three) times daily. 02/16/20  Yes Inda Coke, PA  hydrALAZINE (APRESOLINE) 25 MG tablet Take 1 tablet (25 mg total) by mouth 3 (three) times daily. 02/29/20 02/28/21 Yes Worley, Aldona Bar, PA  insulin glargine (LANTUS) 100 UNIT/ML Solostar Pen Inject 72 Units into the skin at bedtime. 02/29/20  Yes Inda Coke, PA  isosorbide mononitrate (IMDUR) 30 MG 24 hr  tablet Take 2 tablets (60 mg total) by mouth daily. 02/29/20 02/28/21 Yes Worley, Aldona Bar, PA  metFORMIN (GLUCOPHAGE) 1000 MG tablet TAKE 1 TABLET BY MOUTH TWICE DAILY WITH  A  MEAL Patient taking differently: Take 1,000 mg by mouth 2 (two) times daily.  05/03/20  Yes Inda Coke, PA  nitroGLYCERIN (NITROSTAT) 0.4 MG SL tablet Place 1 tablet (0.4 mg total) under the tongue every 5 (five) minutes as needed for chest pain. 02/01/20  Yes Angiulli, Lavon Paganini, PA-C  rosuvastatin (CRESTOR) 20 MG tablet Take 1 tablet by mouth once daily Patient taking differently: Take 20 mg by mouth daily.  05/03/20  Yes Inda Coke, PA  ticagrelor (BRILINTA) 90 MG TABS tablet Take 45 mg by mouth 2 (two) times daily.   Yes [provider]  tiZANidine (ZANAFLEX) 2 MG tablet Take 1 tablet (2 mg total) by mouth 2 (two) times daily as needed (muscle spasm). 02/01/20  Yes Angiulli, Lavon Paganini, PA-C  torsemide (DEMADEX) 20 MG tablet Take 20 mg by mouth 2 (two) times daily. 02/24/20  Yes [provider]  apixaban (ELIQUIS) 5 MG TABS tablet Take 1 tablet (5 mg total) by mouth 2 (two) times daily. 02/29/20   Inda Coke, PA  BD PEN NEEDLE NANO U/F 32G X 4 MM MISC Inject into the skin as directed. 02/01/20   [provider]  enoxaparin (LOVENOX) 150 MG/ML injection Inject 1 mL (150 mg total) into the skin daily for 2 days. Patient not taking: Reported on 05/11/2020 03/14/20 05/11/20  Inda Coke, PA  nystatin ointment (MYCOSTATIN) Apply to affected area 1-2 times daily Patient not taking: Reported on 03/16/2020 02/16/20   Inda Coke, PA    Scheduled Meds: . aspirin EC  81 mg Oral Daily  . carvedilol  25 mg Oral BID WC  . citalopram  10 mg Oral Daily  . digoxin  0.125 mg Oral Daily  . hydrALAZINE  25 mg Oral TID  . ticagrelor  45 mg Oral BID   Continuous Infusions: . 0.45 % NaCl with KCl 20 mEq / L 75 mL/hr at 05/12/20 1502  . [START ON 05/13/2020] cefTRIAXone (ROCEPHIN)  IV     PRN  Meds:.  Results for orders placed or performed during the hospital encounter of 05/11/20 (from the past 48 hour(s))  CBC     Status: Abnormal   Collection Time: 05/11/20  8:16 PM  Result Value Ref Range   WBC 10.8 (H) 4.0 - 10.5 K/uL   RBC 4.13 3.87 - 5.11 MIL/uL   Hemoglobin 11.6 (L) 12.0 - 15.0 g/dL   HCT 34.8 (L) 36 - 46 %   MCV 84.3 80.0 - 100.0 fL   MCH 28.1 26.0 - 34.0 pg   MCHC 33.3 30.0 - 36.0 g/dL   RDW 13.9 11.5 - 15.5 %   Platelets 333 150 - 400 K/uL   nRBC 0.0 0.0 - 0.2 %    Comment: Performed at [facility], [address], [place], [place] [address]  Basic metabolic panel     Status: Abnormal   Collection Time: 05/11/20  8:16 PM  Result Value Ref Range   Sodium 134 (L) 135 - 145 mmol/L   Potassium 2.9 (L) 3.5 - 5.1 mmol/L   Chloride 96 (L) 98 - 111 mmol/L   CO2 23 22 - 32 mmol/L   Glucose, Bld 123 (H) 70 - 99 mg/dL    Comment: Glucose reference range applies only to samples taken after fasting for at least 8 hours.   BUN 21 (H) 6 - 20 mg/dL   Creatinine, Ser 2.78 (H) 0.44 - 1.00 mg/dL   Calcium 8.7 (L) 8.9 - 10.3 mg/dL   GFR calc non Af Amer 19 (L) >60 mL/min   Anion gap 15 5 - 15    Comment: Performed at [facility], [address], [place],  [place] [address]  Magnesium     Status: Abnormal   Collection Time: 05/11/20  8:16 PM  Result Value Ref Range   Magnesium 1.3 (L) 1.7 - 2.4 mg/dL    Comment: Performed at [facility], [address] [place], [place] [address]  Hemoglobin A1c     Status: Abnormal   Collection Time: 05/11/20  8:16 PM  Result Value Ref Range   Hgb A1c MFr Bld 8.2 (H) 4.8 - 5.6 %    Comment: (NOTE) Pre diabetes:          5.7%-6.4%  Diabetes:              >6.4%  Glycemic control for   <7.0% adults with diabetes    Mean Plasma Glucose 188.64 mg/dL    Comment: Performed at [facility] [address], [place], [place] [address]  Vitamin B12     Status: None   Collection Time: 05/11/20  8:16 PM  Result Value Ref Range    Vitamin B-12 244 180 - 914 pg/mL    Comment: (NOTE) This assay is not validated for testing neonatal or myeloproliferative syndrome specimens for Vitamin B12 levels. Performed at [facility], [address] [place], [place] [address]   TSH     Status: None   Collection Time: 05/11/20  8:16 PM  Result Value Ref Range   TSH 1.683 0.350 - 4.500 uIU/mL    Comment: Performed by a 3rd Generation assay with a functional sensitivity of <=0.01 uIU/mL. Performed at [facility], [address], [place],  [address]   POC CBG, ED     Status: Abnormal   Collection Time: 05/11/20  8:57 PM  Result Value Ref Range   Glucose-Capillary 138 (H) 70 - 99 mg/dL    Comment: Glucose reference range applies only to samples taken after  fasting for at least 8 hours.  Urinalysis, Routine w reflex microscopic Urine, Catheterized     Status: Abnormal   Collection Time: 05/11/20 10:55 PM  Result Value Ref Range   Color, Urine YELLOW YELLOW   APPearance CLOUDY (A) CLEAR   Specific Gravity, Urine 1.016 1.005 - 1.030   pH 5.0 5.0 - 8.0   Glucose, UA NEGATIVE NEGATIVE mg/dL   Hgb urine dipstick NEGATIVE NEGATIVE   Bilirubin Urine NEGATIVE NEGATIVE   Ketones, ur NEGATIVE NEGATIVE mg/dL   Protein, ur 30 (A) NEGATIVE mg/dL   Nitrite NEGATIVE NEGATIVE   Leukocytes,Ua LARGE (A) NEGATIVE   RBC / HPF 6-10 0 - 5 RBC/hpf   WBC, UA >50 (H) 0 - 5 WBC/hpf   Bacteria, UA RARE (A) NONE SEEN   Squamous Epithelial / LPF 0-5 0 - 5   WBC Clumps PRESENT    Mucus PRESENT    Hyaline Casts, UA PRESENT    Non Squamous Epithelial 0-5 (A) NONE SEEN    Comment: Performed at [facility], [address] [place], [place] [address]  Respiratory Panel by RT PCR (Flu A&B, Covid) - Nasopharyngeal Swab     Status: None   Collection Time: 05/11/20 11:59 PM   Specimen: Nasopharyngeal Swab  Result Value Ref Range   SARS Coronavirus 2 by RT PCR NEGATIVE NEGATIVE    Comment: (NOTE) SARS-CoV-2 target nucleic acids are NOT  DETECTED.  The SARS-CoV-2 RNA is generally detectable in upper respiratoy specimens during the acute phase of infection. The lowest concentration of SARS-CoV-2 viral copies this assay can detect is 131 copies/mL. A negative result does not preclude SARS-Cov-2 infection and should not be used as the sole basis for treatment or other patient management decisions. A negative result may occur with  improper specimen collection/handling, submission of specimen other than nasopharyngeal swab, presence of viral mutation(s) within the areas targeted by this assay, and inadequate number of viral copies (<131 copies/mL). A negative result must be combined with clinical observations, patient history, and epidemiological information. The expected result is Negative.  Fact Sheet for Patients:  PinkCheek.be  Fact Sheet for Healthcare Providers:  GravelBags.it  This test is no t yet approved or cleared by the [place] FDA and  has been authorized for detection and/or diagnosis of SARS-CoV-2 by FDA under an Emergency Use Authorization (EUA). This EUA will remain  in effect (meaning this test can be used) for the duration of the COVID-19 declaration under Section 564(b)(1) of the Act, 21 U.S.C. section 360bbb-3(b)(1), unless the authorization is terminated or revoked sooner.     Influenza A by PCR NEGATIVE NEGATIVE   Influenza B by PCR NEGATIVE NEGATIVE    Comment: (NOTE) The Xpert Xpress SARS-CoV-2/FLU/RSV assay is intended as an aid in  the diagnosis of influenza from Nasopharyngeal swab specimens and  should not be used as a sole basis for treatment. Nasal washings and  aspirates are unacceptable for Xpert Xpress SARS-CoV-2/FLU/RSV  testing.  Fact Sheet for Patients: PinkCheek.be  Fact Sheet for Healthcare Providers: GravelBags.it  This test is not yet approved or  cleared by the [place] FDA and  has been authorized for detection and/or diagnosis of SARS-CoV-2 by  FDA under an Emergency Use Authorization (EUA). This EUA will remain  in effect (meaning this test can be used) for the duration of the  Covid-19 declaration under Section 564(b)(1) of the Act, 21  U.S.C. section 360bbb-3(b)(1), unless the authorization is  terminated or revoked. Performed at [facility], [address], [place], [place] [address]   Comprehensive metabolic panel     Status: Abnormal   Collection Time: 05/12/20  3:13 AM  Result Value Ref Range   Sodium 135 135 - 145 mmol/L   Potassium 3.2 (L) 3.5 - 5.1 mmol/L   Chloride 103 98 - 111 mmol/L   CO2 20 (L) 22 - 32 mmol/L   Glucose, Bld 114 (H) 70 - 99 mg/dL    Comment: Glucose reference range applies only to samples taken after fasting for at least 8 hours.   BUN 19 6 - 20 mg/dL   Creatinine, Ser 2.38 (H) 0.44 - 1.00 mg/dL   Calcium 7.7 (L) 8.9 - 10.3 mg/dL   Total Protein 6.3 (L) 6.5 - 8.1 g/dL   Albumin 3.0 (L) 3.5 - 5.0 g/dL   AST 9 (L) 15 - 41 U/L   ALT 6 0 - 44 U/L   Alkaline Phosphatase 50 38 - 126 U/L   Total Bilirubin 0.7 0.3 - 1.2 mg/dL   GFR calc non Af Amer 23 (L) >60 mL/min   Anion gap 12 5 - 15    Comment: Performed at [facility], [address], [place], [place] [address]  CBC     Status: Abnormal   Collection Time: 05/12/20  3:13 AM  Result Value Ref Range   WBC 8.8 4.0 - 10.5 K/uL   RBC 3.50 (L) 3.87 - 5.11 MIL/uL   Hemoglobin 10.0 (L) 12.0 - 15.0 g/dL   HCT 29.5 (L) 36 - 46 %   MCV 84.3 80.0 - 100.0 fL   MCH 28.6 26.0 - 34.0 pg   MCHC 33.9 30.0 - 36.0 g/dL   RDW 13.9 11.5 - 15.5 %   Platelets 250 150 - 400 K/uL   nRBC 0.0 0.0 - 0.2 %    Comment: Performed at [facility], [address] [place], [place] [address]  Protime-INR     Status: Abnormal   Collection Time: 05/12/20  3:13 AM  Result Value Ref Range   Prothrombin Time 17.5 (H) 11.4 - 15.2 seconds   INR 1.5 (H) 0.8 - 1.2     Comment: (NOTE) INR goal varies based on device and disease states. Performed at [facility], [address] [place], [place] [address]   APTT     Status: None   Collection Time: 05/12/20  3:13 AM  Result Value Ref Range   aPTT 33 24 - 36 seconds    Comment: Performed at [facility], [address], [place], [place] [address]  Magnesium     Status: Abnormal   Collection Time: 05/12/20  3:13 AM  Result Value Ref Range   Magnesium 1.2 (L) 1.7 - 2.4 mg/dL    Comment: Performed at [facility], [address] [place], [place] [address]  Phosphorus     Status: Abnormal   Collection Time: 05/12/20  3:13 AM  Result Value Ref Range   Phosphorus 2.2 (L) 2.5 - 4.6 mg/dL    Comment: Performed at [facility], [address], [place],  [address]  Basic metabolic panel     Status: Abnormal   Collection Time: 05/12/20  9:13 AM  Result Value Ref Range   Sodium 136 135 - 145 mmol/L   Potassium 3.2 (L) 3.5 - 5.1 mmol/L   Chloride 103 98 - 111 mmol/L   CO2 22 22 - 32 mmol/L   Glucose, Bld 113 (H) 70 - 99 mg/dL    Comment: Glucose  reference range applies only to samples taken after fasting for at least 8 hours.   BUN 18 6 - 20 mg/dL   Creatinine, Ser 2.39 (H) 0.44 - 1.00 mg/dL   Calcium 8.0 (L) 8.9 - 10.3 mg/dL   GFR calc non Af Amer 22 (L) >60 mL/min   Anion gap 11 5 - 15    Comment: Performed at [facility], [address], [place], [place] [address]    Studies/Results:   MRI/MRA BRAIN FINDINGS: MRI HEAD  Brain: There is no acute infarction. Extensive encephalomalacia and chronic blood products are present in the anterior/inferior left MCA territory involving frontal lobe, insula, temporal lobe, and basal ganglia with some parietal extension. Additional small chronic cortical infarct in the right posterior temporal region with chronic blood products. There is ex vacuo dilatation of the left lateral ventricle. There is no intracranial mass, mass effect, or edema. There is no  hydrocephalus or extra-axial fluid collection.  Vascular: Better evaluated on MRA portion.  Skull and upper cervical spine: Normal marrow signal is preserved.  Sinuses/Orbits: Paranasal sinuses are aerated. Orbits are unremarkable.  Other: Sella is unremarkable. Mastoid air cells are clear.  MRA HEAD  Intracranial internal carotid arteries are patent. Middle and anterior cerebral arteries are patent. Duplicated anterior communicating artery is noted. Intracranial vertebral arteries, basilar artery, posterior cerebral arteries are patent.  Intracranial right vertebral artery is patent. Intracranial left vertebral artery demonstrates diminished flow related enhancement proximally with preserved enhancement on post-contrast neck MRA. Basilar artery and posterior cerebral arteries are patent. A left posterior communicating artery is identified.  Aneurysms again identified arising from the right paraclinoid ICA, communicating segment of the left ICA, and left A1-A2 junction. The left MCA bifurcation aneurysm seen on angiogram is not identified. Likely no substantial change in size.  MRA NECK  Common, internal, and external carotid arteries are patent. There is no measurable stenosis at the ICA origins.  Extracranial vertebral arteries are patent. Right vertebral artery is dominant. There is atherosclerotic irregularity of the proximal left vertebral artery with areas of mild and moderate stenosis. There is moderate stenosis of the proximal right V1 vertebral artery.  IMPRESSION: No acute abnormality. Chronic left MCA infarction.  Three of four aneurysms seen on prior catheter angiogram are again identified and are likely without substantial change.  Extracranial vertebral artery atherosclerosis, left greater than Right.    The brain MRI scan is reviewed in [place] and shows a large area of encephalomalacia involving the left frontal parietal region.  This is associated  with a microhemorrhage on SWI.  No acute changes are noted on DWI.   Mahmood Boehringer A. Merlene Laughter, M.D.  Diplomate, Tax adviser of Psychiatry and Neurology ( Neurology). 05/12/2020, 3:26 PM

## 2020-05-13 DIAGNOSIS — R4182 Altered mental status, unspecified: Secondary | ICD-10-CM | POA: Diagnosis not present

## 2020-05-13 LAB — CBC
HCT: 31.7 % — ABNORMAL LOW (ref 36.0–46.0)
Hemoglobin: 10.5 g/dL — ABNORMAL LOW (ref 12.0–15.0)
MCH: 28 pg (ref 26.0–34.0)
MCHC: 33.1 g/dL (ref 30.0–36.0)
MCV: 84.5 fL (ref 80.0–100.0)
Platelets: 265 10*3/uL (ref 150–400)
RBC: 3.75 MIL/uL — ABNORMAL LOW (ref 3.87–5.11)
RDW: 13.8 % (ref 11.5–15.5)
WBC: 8.4 10*3/uL (ref 4.0–10.5)
nRBC: 0 % (ref 0.0–0.2)

## 2020-05-13 LAB — GLUCOSE, CAPILLARY
Glucose-Capillary: 122 mg/dL — ABNORMAL HIGH (ref 70–99)
Glucose-Capillary: 132 mg/dL — ABNORMAL HIGH (ref 70–99)
Glucose-Capillary: 113 mg/dL — ABNORMAL HIGH (ref 70–99)

## 2020-05-13 LAB — BASIC METABOLIC PANEL
Anion gap: 11 (ref 5–15)
BUN: 14 mg/dL (ref 6–20)
CO2: 20 mmol/L — ABNORMAL LOW (ref 22–32)
Calcium: 8.4 mg/dL — ABNORMAL LOW (ref 8.9–10.3)
Chloride: 105 mmol/L (ref 98–111)
Creatinine, Ser: 1.99 mg/dL — ABNORMAL HIGH (ref 0.44–1.00)
GFR, Estimated: 28 mL/min — ABNORMAL LOW (ref >60)
Glucose, Bld: 120 mg/dL — ABNORMAL HIGH (ref 70–99)
Potassium: 3.8 mmol/L (ref 3.5–5.1)
Sodium: 136 mmol/L (ref 135–145)

## 2020-05-13 LAB — MAGNESIUM: Magnesium: 2.1 mg/dL (ref 1.7–2.4)

## 2020-05-13 LAB — URINE CULTURE: Culture: <10000 — AB

## 2020-05-13 MED ORDER — LACTATED RINGERS IV SOLN: INTRAVENOUS | Status: AC

## 2020-05-13 NOTE — Progress Notes (Signed)
PROGRESS NOTE    Robin [name]  IRJ:[id number] DOB: 1966-06-23 DOA: 05/11/2020 PCP: Inda Coke, PA   Brief Narrative:  Per HPI: Robin [name] a 54 y.o.femalewith medical history significant forDM, HTN, atrial fibrillation,anemia, CHF, COPD, ascending aortic aneurysm, CVA with residual right-sided weakness and impaired speech who presentsto theemergency department via EMS due to altered mental status. Patient was unable to provide history, history was obtained from sister at bedside and ED physician,as well as ED medical chart. Per report. Patient was reported to have become altered within last 2 days with decreased eating or drinking of water, she was also reported to complain of right-sided headache within last week. She has an appointment on 10/13 for embolization of her brain aneurysms with Dr Estanislado Pandy. There was no report of fever, chills, chest pain, shortness of breath, nausea, vomiting or diarrhea.  10/8: Patient continues to have some altered mentation with what appears to be a UTI.  Brain MRI with no acute findings of CVA noted and 2D echocardiogram with LVEF 70-75%.  10/9: Patient continues to remain somewhat confused, but appears to be improving.  Renal function improving.  She is noted to be somewhat bradycardic this a.m. for which digoxin has been held and EKG and digoxin level are being obtained.  Urine cultures with insignificant growth thus far.  Neurology has evaluated patient with EEG ordered and pending.  Assessment & Plan:   Principal Problem:   Altered mental status Active Problems:   COPD (chronic obstructive pulmonary disease) (HCC)   Essential hypertension, benign   Hypokalemia   AKI (acute kidney injury) ([place])   Right hemiparesis (HCC)   UTI (urinary tract infection)   Prolonged QT interval   History of CVA (cerebrovascular accident)   Atrial fibrillation, chronic (HCC)   History of CHF (congestive heart failure)    Hypomagnesemia   Altered metabolic encephalopathy likely multifactorial-improving -Brain MRI with no acute findings of CVA noted -Appreciate neurology evaluation with EEG ordered to rule out seizure -Urine analysis with signs of pyuria suspicious for UTI with cultures demonstrating insignificant growth -Continue Rocephin for now day 3/3 -TSH 1.6 -2D echocardiogram 70-75% -PT evaluation with recommendation for SNF for which patient has agreeable -Patient started on dysphagia 3 diet -Continue ongoing management as ordered with potential need for placement  Presumed UTI POA Urinalysis was positive for large leukocytes and WBC>50but rare bacteria Cultures with insignificant growth Plan to discontinue Rocephin on 10/09 after 3 total doses  Hypomagnesemia/hypokalemia-resolved -Reevaluate in a.m.  Prolonged QTc, now with bradycardia QTc641ms Avoid QT prolonging drugs Repeat EKG and digoxin level pending  Acute kidney injury BUN to creatinine 21/2.78 on admission (baseline creatinine 1.1-1.3). Current creatinine 1.99 Renally adjust medications, avoid nephrotoxic agents/dehydration/hypotension -Continue on aggressive IV fluid and recheck labs in a.m. -Check urine electrolytes and renal ultrasound with no acute findings -Monitor I's and O's  COPD (not in acute exacerbation) No medication against COPD noted on med rec -No acute bronchospasms noted  History of CVA with residual right-sided weakness Home meds will be resumed once patient is cleared for oral intake  A. fib with rate control EKG presents with A. fib with rate control Continue telemetry Coreg and digoxin with holding parameters due to bradycardia -Repeat EKG on 10/9 pending along with digoxin level  Essential hypertension (controlled) Continue home blood pressure agents  History of CHF 2D echocardiogram with LVEF 70-75% and no other acute findings   DVT prophylaxis:SCDs Code Status: Full  code Family Communication: Discussed with sister on  10/9  Disposition Plan:  Status is: Inpatient  Remains inpatient appropriate because:Altered mental status, IV treatments appropriate due to intensity of illness or inability to take PO and Inpatient level of care appropriate due to severity of illness   Dispo: The patient is from: Home  Anticipated d/c is to: SNF  Anticipated d/c date is: 2 days  Patient currently is not medically stable to d/c.  Patient continues to have some ongoing confusion and needs ongoing treatment for UTI.   Consultants:   Neurology  Procedures:   See below  Antimicrobials:  Anti-infectives (From admission, onward)   Start     Dose/Rate Route Frequency Ordered Stop   05/13/20 0000  cefTRIAXone (ROCEPHIN) 1 g in sodium chloride 0.9 % 100 mL IVPB        1 g 200 mL/hr over 30 Minutes Intravenous Every 24 hours 05/12/20 1205     05/12/20 2200  cefTRIAXone (ROCEPHIN) injection 1 g  Status:  Discontinued        1 g Intramuscular Every 24 hours 05/12/20 0244 05/12/20 1205   05/11/20 2345  cefTRIAXone (ROCEPHIN) 1 g in sodium chloride 0.9 % 100 mL IVPB        1 g 200 mL/hr over 30 Minutes Intravenous  Once 05/11/20 2344 05/12/20 0549       Subjective: Patient seen and evaluated today with no new acute complaints or concerns. No acute concerns or events noted overnight.  She still appears somewhat confused.  Noted to be bradycardic this morning.  Objective: Vitals:   05/12/20 1923 05/13/20 0444 05/13/20 1049 05/13/20 1053  BP: 127/68 121/66 140/72   Pulse: 68 62 (!) 47 (!) 47  Resp: 18 17 18    Temp: 98.3 F (36.8 C) 97.8 F (36.6 C)    TempSrc: Oral Oral    SpO2: 100% 100% 100%   Weight:      Height:        Intake/Output Summary (Last 24 hours) at 05/13/2020 1236 Last data filed at 05/13/2020 0400 Gross per 24 hour  Intake 713.64 ml  Output 450 ml  Net 263.64 ml   Filed Weights   05/11/20 1953   Weight: 90.7 kg    Examination:  General exam: Appears calm and comfortable, appears confused Respiratory system: Clear to auscultation. Respiratory effort normal. Cardiovascular system: S1 & S2 heard, RRR. Gastrointestinal system: Abdomen is nondistended, soft and nontender. Central nervous system: Alert and awake Extremities: No edema Skin: No rashes, lesions or ulcers Psychiatry: Judgement and insight appear normal. Mood & affect appropriate.     Data Reviewed: I have personally reviewed following labs and imaging studies  CBC: Recent Labs  Lab 05/11/20 2016 05/12/20 0313 05/13/20 0624  WBC 10.8* 8.8 8.4  HGB 11.6* 10.0* 10.5*  HCT 34.8* 29.5* 31.7*  MCV 84.3 84.3 84.5  PLT 333 250 295   Basic Metabolic Panel: Recent Labs  Lab 05/11/20 2016 05/12/20 0313 05/12/20 0913 05/13/20 0624 05/13/20 0700  NA 134* 135 136 136  --   K 2.9* 3.2* 3.2* 3.8  --   CL 96* 103 103 105  --   CO2 23 20* 22 20*  --   GLUCOSE 123* 114* 113* 120*  --   BUN 21* 19 18 14   --   CREATININE 2.78* 2.38* 2.39* 1.99*  --   CALCIUM 8.7* 7.7* 8.0* 8.4*  --   MG 1.3* 1.2*  --   --  2.1  PHOS  --  2.2*  --   --   --  GFR: Estimated Creatinine Clearance: 37.8 mL/min (A) (by C-G formula based on SCr of 1.99 mg/dL (H)). Liver Function Tests: Recent Labs  Lab 05/12/20 0313  AST 9*  ALT 6  ALKPHOS 50  BILITOT 0.7  PROT 6.3*  ALBUMIN 3.0*   No results for input(s): LIPASE, AMYLASE in the last 168 hours. No results for input(s): AMMONIA in the last 168 hours. Coagulation Profile: Recent Labs  Lab 05/12/20 0313  INR 1.5*   Cardiac Enzymes: No results for input(s): CKTOTAL, CKMB, CKMBINDEX, TROPONINI in the last 168 hours. BNP (last 3 results) No results for input(s): PROBNP in the last 8760 hours. HbA1C: Recent Labs    05/11/20 2016  HGBA1C 8.2*   CBG: Recent Labs  Lab 05/11/20 2057 05/12/20 1614 05/12/20 2107 05/13/20 0738 05/13/20 1115  GLUCAP 138* 117* 107* 122*  132*   Lipid Profile: No results for input(s): CHOL, HDL, LDLCALC, TRIG, CHOLHDL, LDLDIRECT in the last 72 hours. Thyroid Function Tests: Recent Labs    05/11/20 2016  TSH 1.683   Anemia Panel: Recent Labs    05/11/20 2016  VITAMINB12 244   Sepsis Labs: No results for input(s): PROCALCITON, LATICACIDVEN in the last 168 hours.  Recent Results (from the past 240 hour(s))  Urine culture     Status: Abnormal   Collection Time: 05/11/20 11:44 PM   Specimen: Urine, Clean Catch  Result Value Ref Range Status   Specimen Description   Final    URINE, CLEAN CATCH Performed at [facility], [address], [place], [place] [address]    Special Requests   Final    NONE Performed at [facility], [address], [place], [place] [address]    Culture (A)  Final    <10,000 COLONIES/mL INSIGNIFICANT GROWTH Performed at [facility] [address] [place], [place] [address]    Report Status 05/13/2020 FINAL  Final  Respiratory Panel by RT PCR (Flu A&B, Covid) - Nasopharyngeal Swab     Status: None   Collection Time: 05/11/20 11:59 PM   Specimen: Nasopharyngeal Swab  Result Value Ref Range Status   SARS Coronavirus 2 by RT PCR NEGATIVE NEGATIVE Final    Comment: (NOTE) SARS-CoV-2 target nucleic acids are NOT DETECTED.  The SARS-CoV-2 RNA is generally detectable in upper respiratoy specimens during the acute phase of infection. The lowest concentration of SARS-CoV-2 viral copies this assay can detect is 131 copies/mL. A negative result does not preclude SARS-Cov-2 infection and should not be used as the sole basis for treatment or other patient management decisions. A negative result may occur with  improper specimen collection/handling, submission of specimen other than nasopharyngeal swab, presence of viral mutation(s) within the areas targeted by this assay, and inadequate number of viral copies (<131 copies/mL). A negative result must be combined with  clinical observations, patient history, and epidemiological information. The expected result is Negative.  Fact Sheet for Patients:  PinkCheek.be  Fact Sheet for Healthcare Providers:  GravelBags.it  This test is no t yet approved or cleared by the [place] FDA and  has been authorized for detection and/or diagnosis of SARS-CoV-2 by FDA under an Emergency Use Authorization (EUA). This EUA will remain  in effect (meaning this test can be used) for the duration of the COVID-19 declaration under Section 564(b)(1) of the Act, 21 U.S.C. section 360bbb-3(b)(1), unless the authorization is terminated or revoked sooner.     Influenza A by PCR NEGATIVE NEGATIVE Final   Influenza B by PCR NEGATIVE  NEGATIVE Final    Comment: (NOTE) The Xpert Xpress SARS-CoV-2/FLU/RSV assay is intended as an aid in  the diagnosis of influenza from Nasopharyngeal swab specimens and  should not be used as a sole basis for treatment. Nasal washings and  aspirates are unacceptable for Xpert Xpress SARS-CoV-2/FLU/RSV  testing.  Fact Sheet for Patients: PinkCheek.be  Fact Sheet for Healthcare Providers: GravelBags.it  This test is not yet approved or cleared by the [place] FDA and  has been authorized for detection and/or diagnosis of SARS-CoV-2 by  FDA under an Emergency Use Authorization (EUA). This EUA will remain  in effect (meaning this test can be used) for the duration of the  Covid-19 declaration under Section 564(b)(1) of the Act, 21  U.S.C. section 360bbb-3(b)(1), unless the authorization is  terminated or revoked. Performed at [facility], [address], [place], [place] [address]          Radiology Studies: CT Head Wo Contrast  Result Date: 05/11/2020 CLINICAL DATA:  Right arm deficit EXAM: CT HEAD WITHOUT CONTRAST TECHNIQUE: Contiguous axial images were obtained  from the base of the skull through the vertex without intravenous contrast. COMPARISON:  March 19, 2020 FINDINGS: Brain: Large area of encephalomalacia involving the left frontotemporal lobe as on prior exam. There is ex vacuo dilatation of the anterior horn of the left lateral ventricle. No acute loss of gray-white differentiation or extra-axial collections are seen. There is mild low-attenuation changes in the deep white matter consistent with small vessel ischemia. Ventricles are normal in size and contour. Vascular: No hyperdense vessel or unexpected calcification. Skull: The skull is intact. No fracture or focal lesion identified. Sinuses/Orbits: The visualized paranasal sinuses and mastoid air cells are clear. The orbits and globes intact. Other: None IMPRESSION: No acute intracranial abnormality. Stable area of encephalomalacia involving the left frontotemporal lobe. Electronically Signed   By: Prudencio Pair M.D.   On: 05/11/2020 20:46   MR ANGIO HEAD WO CONTRAST  Result Date: 05/12/2020 CLINICAL DATA:  Altered mental status, headache, history of aneurysm EXAM: MRI HEAD WITHOUT CONTRAST MRA HEAD WITHOUT CONTRAST MRA NECK WITHOUT AND WITH CONTRAST TECHNIQUE: Multiplanar, multiecho pulse sequences of the brain and surrounding structures were obtained without intravenous contrast. Angiographic images of the Circle of Willis were obtained using MRA technique without intravenous contrast. Angiographic images of the neck were obtained using MRA technique without and with intravenous contrast. Carotid stenosis measurements (when applicable) are obtained utilizing NASCET criteria, using the distal internal carotid diameter as the denominator. CONTRAST:  48mL GADAVIST GADOBUTROL 1 MMOL/ML IV SOLN COMPARISON:  MRI 12/27/2019, correlation made with CT head 05/11/2020, prior angiogram FINDINGS: MRI HEAD Brain: There is no acute infarction. Extensive encephalomalacia and chronic blood products are present in the  anterior/inferior left MCA territory involving frontal lobe, insula, temporal lobe, and basal ganglia with some parietal extension. Additional small chronic cortical infarct in the right posterior temporal region with chronic blood products. There is ex vacuo dilatation of the left lateral ventricle. There is no intracranial mass, mass effect, or edema. There is no hydrocephalus or extra-axial fluid collection. Vascular: Better evaluated on MRA portion. Skull and upper cervical spine: Normal marrow signal is preserved. Sinuses/Orbits: Paranasal sinuses are aerated. Orbits are unremarkable. Other: Sella is unremarkable.  Mastoid air cells are clear. MRA HEAD Intracranial internal carotid arteries are patent. Middle and anterior cerebral arteries are patent. Duplicated anterior communicating artery is noted. Intracranial vertebral arteries, basilar artery, posterior cerebral arteries are patent. Intracranial right vertebral artery  is patent. Intracranial left vertebral artery demonstrates diminished flow related enhancement proximally with preserved enhancement on post-contrast neck MRA. Basilar artery and posterior cerebral arteries are patent. A left posterior communicating artery is identified. Aneurysms again identified arising from the right paraclinoid ICA, communicating segment of the left ICA, and left A1-A2 junction. The left MCA bifurcation aneurysm seen on angiogram is not identified. Likely no substantial change in size. MRA NECK Common, internal, and external carotid arteries are patent. There is no measurable stenosis at the ICA origins. Extracranial vertebral arteries are patent. Right vertebral artery is dominant. There is atherosclerotic irregularity of the proximal left vertebral artery with areas of mild and moderate stenosis. There is moderate stenosis of the proximal right V1 vertebral artery. IMPRESSION: No acute abnormality.  Chronic left MCA infarction. Three of four aneurysms seen on prior  catheter angiogram are again identified and are likely without substantial change. Extracranial vertebral artery atherosclerosis, left greater than right. Electronically Signed   By: Macy Mis M.D.   On: 05/12/2020 09:06   MR ANGIO NECK W WO CONTRAST  Result Date: 05/12/2020 CLINICAL DATA:  Altered mental status, headache, history of aneurysm EXAM: MRI HEAD WITHOUT CONTRAST MRA HEAD WITHOUT CONTRAST MRA NECK WITHOUT AND WITH CONTRAST TECHNIQUE: Multiplanar, multiecho pulse sequences of the brain and surrounding structures were obtained without intravenous contrast. Angiographic images of the Circle of Willis were obtained using MRA technique without intravenous contrast. Angiographic images of the neck were obtained using MRA technique without and with intravenous contrast. Carotid stenosis measurements (when applicable) are obtained utilizing NASCET criteria, using the distal internal carotid diameter as the denominator. CONTRAST:  81mL GADAVIST GADOBUTROL 1 MMOL/ML IV SOLN COMPARISON:  MRI 12/27/2019, correlation made with CT head 05/11/2020, prior angiogram FINDINGS: MRI HEAD Brain: There is no acute infarction. Extensive encephalomalacia and chronic blood products are present in the anterior/inferior left MCA territory involving frontal lobe, insula, temporal lobe, and basal ganglia with some parietal extension. Additional small chronic cortical infarct in the right posterior temporal region with chronic blood products. There is ex vacuo dilatation of the left lateral ventricle. There is no intracranial mass, mass effect, or edema. There is no hydrocephalus or extra-axial fluid collection. Vascular: Better evaluated on MRA portion. Skull and upper cervical spine: Normal marrow signal is preserved. Sinuses/Orbits: Paranasal sinuses are aerated. Orbits are unremarkable. Other: Sella is unremarkable.  Mastoid air cells are clear. MRA HEAD Intracranial internal carotid arteries are patent. Middle and  anterior cerebral arteries are patent. Duplicated anterior communicating artery is noted. Intracranial vertebral arteries, basilar artery, posterior cerebral arteries are patent. Intracranial right vertebral artery is patent. Intracranial left vertebral artery demonstrates diminished flow related enhancement proximally with preserved enhancement on post-contrast neck MRA. Basilar artery and posterior cerebral arteries are patent. A left posterior communicating artery is identified. Aneurysms again identified arising from the right paraclinoid ICA, communicating segment of the left ICA, and left A1-A2 junction. The left MCA bifurcation aneurysm seen on angiogram is not identified. Likely no substantial change in size. MRA NECK Common, internal, and external carotid arteries are patent. There is no measurable stenosis at the ICA origins. Extracranial vertebral arteries are patent. Right vertebral artery is dominant. There is atherosclerotic irregularity of the proximal left vertebral artery with areas of mild and moderate stenosis. There is moderate stenosis of the proximal right V1 vertebral artery. IMPRESSION: No acute abnormality.  Chronic left MCA infarction. Three of four aneurysms seen on prior catheter angiogram are again identified and are likely without  substantial change. Extracranial vertebral artery atherosclerosis, left greater than right. Electronically Signed   By: Macy Mis M.D.   On: 05/12/2020 09:06   MR BRAIN WO CONTRAST  Result Date: 05/12/2020 CLINICAL DATA:  Altered mental status, headache, history of aneurysm EXAM: MRI HEAD WITHOUT CONTRAST MRA HEAD WITHOUT CONTRAST MRA NECK WITHOUT AND WITH CONTRAST TECHNIQUE: Multiplanar, multiecho pulse sequences of the brain and surrounding structures were obtained without intravenous contrast. Angiographic images of the Circle of Willis were obtained using MRA technique without intravenous contrast. Angiographic images of the neck were obtained  using MRA technique without and with intravenous contrast. Carotid stenosis measurements (when applicable) are obtained utilizing NASCET criteria, using the distal internal carotid diameter as the denominator. CONTRAST:  27mL GADAVIST GADOBUTROL 1 MMOL/ML IV SOLN COMPARISON:  MRI 12/27/2019, correlation made with CT head 05/11/2020, prior angiogram FINDINGS: MRI HEAD Brain: There is no acute infarction. Extensive encephalomalacia and chronic blood products are present in the anterior/inferior left MCA territory involving frontal lobe, insula, temporal lobe, and basal ganglia with some parietal extension. Additional small chronic cortical infarct in the right posterior temporal region with chronic blood products. There is ex vacuo dilatation of the left lateral ventricle. There is no intracranial mass, mass effect, or edema. There is no hydrocephalus or extra-axial fluid collection. Vascular: Better evaluated on MRA portion. Skull and upper cervical spine: Normal marrow signal is preserved. Sinuses/Orbits: Paranasal sinuses are aerated. Orbits are unremarkable. Other: Sella is unremarkable.  Mastoid air cells are clear. MRA HEAD Intracranial internal carotid arteries are patent. Middle and anterior cerebral arteries are patent. Duplicated anterior communicating artery is noted. Intracranial vertebral arteries, basilar artery, posterior cerebral arteries are patent. Intracranial right vertebral artery is patent. Intracranial left vertebral artery demonstrates diminished flow related enhancement proximally with preserved enhancement on post-contrast neck MRA. Basilar artery and posterior cerebral arteries are patent. A left posterior communicating artery is identified. Aneurysms again identified arising from the right paraclinoid ICA, communicating segment of the left ICA, and left A1-A2 junction. The left MCA bifurcation aneurysm seen on angiogram is not identified. Likely no substantial change in size. MRA NECK  Common, internal, and external carotid arteries are patent. There is no measurable stenosis at the ICA origins. Extracranial vertebral arteries are patent. Right vertebral artery is dominant. There is atherosclerotic irregularity of the proximal left vertebral artery with areas of mild and moderate stenosis. There is moderate stenosis of the proximal right V1 vertebral artery. IMPRESSION: No acute abnormality.  Chronic left MCA infarction. Three of four aneurysms seen on prior catheter angiogram are again identified and are likely without substantial change. Extracranial vertebral artery atherosclerosis, left greater than right. Electronically Signed   By: Macy Mis M.D.   On: 05/12/2020 09:06   US RENAL  Result Date: 05/12/2020 CLINICAL DATA:  Acute kidney injury. EXAM: RENAL / URINARY TRACT ULTRASOUND COMPLETE COMPARISON:  Abdominal ultrasound 09/24/2018.  CT 10/02/2018. FINDINGS: Right Kidney: Renal measurements: 12.7 x 6.1 x 6.4 cm = volume: 259.9 mL. Echogenicity within normal limits. No mass or hydronephrosis visualized. Left Kidney: Renal measurements: 13.2 x 6.2 x 5.4 cm = volume: 233.3 mL. Echogenicity within normal limits. No mass or hydronephrosis visualized. Bladder: Mildly distended (volume 546 cc).  No focal abnormality identified. Other: None. IMPRESSION: Normal renal ultrasound. No hydronephrosis. Electronically Signed   By: Richardean Sale M.D.   On: 05/12/2020 13:58   DG Chest Portable 1 View  Result Date: 05/11/2020 CLINICAL DATA:  Change in mental status EXAM: PORTABLE CHEST  1 VIEW COMPARISON:  Dec 27, 2019 FINDINGS: The heart size and mediastinal contours are within normal limits. Both lungs are clear. The visualized skeletal structures are unremarkable. IMPRESSION: No active disease. Electronically Signed   By: Prudencio Pair M.D.   On: 05/11/2020 20:41   ECHOCARDIOGRAM LIMITED  Result Date: 05/12/2020    ECHOCARDIOGRAM LIMITED REPORT   Patient Name:   Myca A Bischof Date of  Exam: 05/12/2020 Medical Rec #:  [id number]      Height:       67.0 in Accession #:    [id number]     Weight:       200.0 lb Date of Birth:  1966/02/27     BSA:          2.022 m Patient Age:    5 years       BP:           111/90 mmHg Patient Gender: F              HR:           66 bpm. Exam Location:  [name] Procedure: Limited Echo, Cardiac Doppler and Limited Color Doppler Indications:    Stroke 434.91/l63.9  History:        Patient has prior history of Echocardiogram examinations, most                 recent 04/26/2020. CHF, COPD and Stroke, Arrythmias:Atrial                 Fibrillation; Risk Factors:Current Smoker, Hypertension and                 Diabetes. Cocaine abuse,Embolic cerebral infarction [facility])                 s/p clot retrieval.  Sonographer:    Alvino Chapel RCS Referring Phys: [id number] OLADAPO ADEFESO IMPRESSIONS  1. Left ventricular ejection fraction, by estimation, is 70 to 75%. The left ventricle has hyperdynamic function. The left ventricle has no regional wall motion abnormalities. There is moderate left ventricular hypertrophy. Left ventricular diastolic parameters are indeterminate.  2. Right ventricular systolic function is normal. The right ventricular size is normal. Tricuspid regurgitation signal is inadequate for assessing PA pressure.  3. A small pericardial effusion is present. The pericardial effusion is circumferential.  4. The mitral valve is abnormal. There is mild prolapse of the anterior leaflet. Mild mitral valve regurgitation.  5. The aortic valve is tricuspid. Aortic valve regurgitation is not visualized.  6. The inferior vena cava is normal in size with greater than 50% respiratory variability, suggesting right atrial pressure of 3 mmHg. FINDINGS  Left Ventricle: Left ventricular ejection fraction, by estimation, is 70 to 75%. The left ventricle has hyperdynamic function. The left ventricle has no regional wall motion abnormalities. The left ventricular internal cavity size  was normal in size. There is moderate left ventricular hypertrophy. Left ventricular diastolic parameters are indeterminate. Right Ventricle: The right ventricular size is normal. No increase in right ventricular wall thickness. Right ventricular systolic function is normal. Tricuspid regurgitation signal is inadequate for assessing PA pressure. Pericardium: A small pericardial effusion is present. The pericardial effusion is circumferential. Presence of pericardial fat pad. Mitral Valve: The mitral valve is abnormal. There is mild thickening of the mitral valve leaflet(s). Mild mitral annular calcification. Mild mitral valve regurgitation. Aortic Valve: The aortic valve is tricuspid. Aortic valve regurgitation is not visualized. Aorta: The aortic root is normal in size and  structure. Venous: The inferior vena cava is normal in size with greater than 50% respiratory variability, suggesting right atrial pressure of 3 mmHg. IAS/Shunts: No atrial level shunt detected by color flow Doppler. LEFT VENTRICLE PLAX 2D LVIDd:         3.98 cm LVIDs:         2.39 cm LV PW:         1.26 cm LV IVS:        1.59 cm LVOT diam:     1.70 cm LVOT Area:     2.27 cm  LEFT ATRIUM         Index LA diam:    4.60 cm 2.27 cm/m   AORTA Ao Root diam: 3.10 cm  SHUNTS Systemic Diam: 1.70 cm Rozann Lesches MD Electronically signed by Rozann Lesches MD Signature Date/Time: 05/12/2020/11:06:36 AM    Final         Scheduled Meds: . aspirin EC  81 mg Oral Daily  . carvedilol  25 mg Oral BID WC  . citalopram  10 mg Oral Daily  . digoxin  0.125 mg Oral Daily  . hydrALAZINE  25 mg Oral TID  . ticagrelor  45 mg Oral BID   Continuous Infusions: . cefTRIAXone (ROCEPHIN)  IV 1 g (05/12/20 2344)  . lactated ringers       LOS: 2 days    Time spent: 35 minutes    Dali Kraner Darleen Crocker, DO [facility]  If 7PM-7AM, please contact night-coverage [email or web address] 05/13/2020, 12:36 PM

## 2020-05-14 DIAGNOSIS — R4182 Altered mental status, unspecified: Secondary | ICD-10-CM | POA: Diagnosis not present

## 2020-05-14 LAB — CBC
HCT: 31.4 % — ABNORMAL LOW (ref 36.0–46.0)
Hemoglobin: 10.4 g/dL — ABNORMAL LOW (ref 12.0–15.0)
MCH: 28 pg (ref 26.0–34.0)
MCHC: 33.1 g/dL (ref 30.0–36.0)
MCV: 84.4 fL (ref 80.0–100.0)
Platelets: 274 10*3/uL (ref 150–400)
RBC: 3.72 MIL/uL — ABNORMAL LOW (ref 3.87–5.11)
RDW: 13.8 % (ref 11.5–15.5)
WBC: 8.8 10*3/uL (ref 4.0–10.5)
nRBC: 0 % (ref 0.0–0.2)

## 2020-05-14 LAB — DIGOXIN LEVEL: Digoxin Level: 1 ng/mL (ref 1.0–2.0)

## 2020-05-14 LAB — GLUCOSE, CAPILLARY
Glucose-Capillary: 141 mg/dL — ABNORMAL HIGH (ref 70–99)
Glucose-Capillary: 150 mg/dL — ABNORMAL HIGH (ref 70–99)
Glucose-Capillary: 126 mg/dL — ABNORMAL HIGH (ref 70–99)

## 2020-05-14 LAB — BASIC METABOLIC PANEL
Anion gap: 9 (ref 5–15)
BUN: 11 mg/dL (ref 6–20)
CO2: 24 mmol/L (ref 22–32)
Calcium: 8.5 mg/dL — ABNORMAL LOW (ref 8.9–10.3)
Chloride: 102 mmol/L (ref 98–111)
Creatinine, Ser: 1.61 mg/dL — ABNORMAL HIGH (ref 0.44–1.00)
GFR, Estimated: 36 mL/min — ABNORMAL LOW (ref >60)
Glucose, Bld: 137 mg/dL — ABNORMAL HIGH (ref 70–99)
Potassium: 3.1 mmol/L — ABNORMAL LOW (ref 3.5–5.1)
Sodium: 135 mmol/L (ref 135–145)

## 2020-05-14 LAB — MAGNESIUM: Magnesium: 1.7 mg/dL (ref 1.7–2.4)

## 2020-05-14 MED ORDER — POTASSIUM CHLORIDE CRYS ER 20 MEQ PO TBCR
40.0000 meq | EXTENDED_RELEASE_TABLET | Freq: Two times a day (BID) | ORAL | Status: AC
  Administered 2020-05-14 (×2): 40 meq via ORAL
  Filled 2020-05-14 (×3): qty 2

## 2020-05-14 MED ORDER — ENSURE ENLIVE PO LIQD
237.0000 mL | Freq: Two times a day (BID) | ORAL | Status: DC
  Administered 2020-05-15 – 2020-05-18 (×5): 237 mL via ORAL

## 2020-05-14 MED ORDER — ALUM & MAG HYDROXIDE-SIMETH 200-200-20 MG/5ML PO SUSP
30.0000 mL | Freq: Four times a day (QID) | ORAL | Status: DC | PRN
  Administered 2020-05-14 – 2020-05-15 (×2): 30 mL via ORAL
  Filled 2020-05-14 (×2): qty 30

## 2020-05-14 MED ORDER — LACTATED RINGERS IV SOLN: INTRAVENOUS | Status: AC

## 2020-05-14 NOTE — Progress Notes (Signed)
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 Patient was unable to provide history, history was obtained from sister at bedside and ED physician,as well as ED medical chart. Per report. Patient was reported to have become altered within last 2 days with decreased eating or drinking of water, she was also reported to complain of right-sided headache within last week. She has an appointment on 10/13 for embolization of her brain aneurysms with Robin [name]. There was no report of fever, chills, chest pain, shortness of breath, nausea, vomiting or diarrhea.  10/8:Patient continues to have some altered mentation with what appears to be a UTI. Brain MRI with no acute findings of CVA noted and 2D echocardiogram with LVEF 70-75%.  10/9-10/10: Patient continues to remain somewhat confused, but appears to be improving.  Renal function improving.  She is noted to be somewhat bradycardic this a.m. for which digoxin has been held and EKG and digoxin level are being obtained.  Urine cultures with insignificant growth thus far.  Neurology has evaluated patient with EEG ordered and pending.   Assessment & Plan:   Principal Problem:   Altered mental status Active Problems:   COPD (chronic obstructive pulmonary disease) (HCC)   Essential hypertension, benign   Hypokalemia   AKI (acute kidney injury) ([place])   Right hemiparesis (HCC)   UTI (urinary tract infection)   Prolonged QT interval   History of CVA (cerebrovascular accident)   Atrial fibrillation, chronic (HCC)   History of CHF (congestive heart failure)    Hypomagnesemia   Alteredmetabolic encephalopathy likely multifactorial-improving -Brain MRI with no acute findings of CVA noted -Appreciate neurology evaluation with EEG ordered to rule out seizure -Urine analysis with signs of pyuria suspicious for UTI with cultures demonstrating insignificant growth -Rocephin discontinued after 3-day course of treatment -TSH 1.6 -2D echocardiogram 70-75% -PT evaluation with recommendation for SNF for which patient has agreeable -Patient started on dysphagia 3 diet and appears to be tolerating well -Continue ongoing management as ordered with potential need for placement  Presumed UTI POA Urinalysis was positive for large leukocytes and WBC>50but rare bacteria Cultures with insignificant growth Plan to discontinue Rocephin on 10/09 after 3 total doses  Hypokalemia -Replete with oral potassium -Reevaluate in a.m.  Prolonged QTc, now with bradycardia QTc639ms Avoid QT prolonging drugs Repeat EKG and digoxin level pending  Acute kidney injury-improving BUN to creatinine 21/2.78 on admission (baseline creatinine 1.1-1.3). Current creatinine 1.6 Renally adjust medications, avoid nephrotoxic agents/dehydration/hypotension -Continue on aggressive IV fluid and recheck labs in a.m. -Check urine electrolytes and renal ultrasound with no acute findings -Monitor I's and O's  COPD (not in acute exacerbation) No medication against COPD noted on med rec -No acute bronchospasms noted  History of CVA with residual right-sided weakness Home meds will be resumed once patient is cleared for oral intake  A. fib with rate control EKG presents with A. fib with rate control Continue telemetry Coreg and digoxin with holding parameters due to bradycardia -Repeat EKG on 10/9 pending along with digoxin level  Essential hypertension (controlled) Continue home blood pressure agents  History of CHF 2D echocardiogram with LVEF 70-75% and no other  acute findings   DVT prophylaxis:SCDs Code  Status:Full code Family Communication:Discussed with sister on 10/10  Disposition Plan: Status is: Inpatient  Remains inpatient appropriate because:Altered mental status, IV treatments appropriate due to intensity of illness or inability to take PO and Inpatient level of care appropriate due to severity of illness   Dispo: The patient is from:Home Anticipated d/c is to:SNF Anticipated d/c date is: 1-2 days Patient currently is not medically stable to d/c. EEG results are currently pending to ensure there is no seizure activity prior to discharge.   Consultants:  Neurology  Procedures:  EEG performed 10/8 with results pending  Antimicrobials:  Anti-infectives (From admission, onward)   Start     Dose/Rate Route Frequency Ordered Stop   05/13/20 0000  cefTRIAXone (ROCEPHIN) 1 g in sodium chloride 0.9 % 100 mL IVPB  Status:  Discontinued        1 g 200 mL/hr over 30 Minutes Intravenous Every 24 hours 05/12/20 1205 05/14/20 0845   05/12/20 2200  cefTRIAXone (ROCEPHIN) injection 1 g  Status:  Discontinued        1 g Intramuscular Every 24 hours 05/12/20 0244 05/12/20 1205   05/11/20 2345  cefTRIAXone (ROCEPHIN) 1 g in sodium chloride 0.9 % 100 mL IVPB        1 g 200 mL/hr over 30 Minutes Intravenous  Once 05/11/20 2344 05/12/20 0549       Subjective: Patient seen and evaluated today with no new acute complaints or concerns.  She appears to be less confused with no acute overnight events noted.  According to family, she appears not to be eating very much.  Objective: Vitals:   05/13/20 1405 05/13/20 1838 05/13/20 2300 05/14/20 0535  BP: 123/62 130/67 122/80 (!) 144/67  Pulse: (!) 54 (!) 57 (!) 58 60  Resp: 16 16 18 17   Temp: 98 F (36.7 C)  98.2 F (36.8 C) (!) 97.4 F (36.3 C)  TempSrc: Oral     SpO2: 100% 100% 100% 100%  Weight:      Height:        Intake/Output  Summary (Last 24 hours) at 05/14/2020 1055 Last data filed at 05/14/2020 0500 Gross per 24 hour  Intake 1431.03 ml  Output 1000 ml  Net 431.03 ml   Filed Weights   05/11/20 1953  Weight: 90.7 kg    Examination:  General exam: Appears calm and comfortable  Respiratory system: Clear to auscultation. Respiratory effort normal. Cardiovascular system: S1 & S2 heard, RRR.  Gastrointestinal system: Abdomen is nondistended, soft and nontender.  Central nervous system: Alert and awake Extremities: No edema Skin: No rashes, lesions or ulcers Psychiatry: Judgement and insight appear normal. Mood & affect appropriate.     Data Reviewed: I have personally reviewed following labs and imaging studies  CBC: Recent Labs  Lab 05/11/20 2016 05/12/20 0313 05/13/20 0624 05/14/20 0643  WBC 10.8* 8.8 8.4 8.8  HGB 11.6* 10.0* 10.5* 10.4*  HCT 34.8* 29.5* 31.7* 31.4*  MCV 84.3 84.3 84.5 84.4  PLT 333 250 265 297   Basic Metabolic Panel: Recent Labs  Lab 05/11/20 2016 05/12/20 0313 05/12/20 0913 05/13/20 0624 05/13/20 0700 05/14/20 0643  NA 134* 135 136 136  --  135  K 2.9* 3.2* 3.2* 3.8  --  3.1*  CL 96* 103 103 105  --  102  CO2 23 20* 22 20*  --  24  GLUCOSE 123* 114* 113* 120*  --  137*  BUN 21* 19 18 14   --  11  CREATININE 2.78*  2.38* 2.39* 1.99*  --  1.61*  CALCIUM 8.7* 7.7* 8.0* 8.4*  --  8.5*  MG 1.3* 1.2*  --   --  2.1 1.7  PHOS  --  2.2*  --   --   --   --    GFR: Estimated Creatinine Clearance: 46.7 mL/min (A) (by C-G formula based on SCr of 1.61 mg/dL (H)). Liver Function Tests: Recent Labs  Lab 05/12/20 0313  AST 9*  ALT 6  ALKPHOS 50  BILITOT 0.7  PROT 6.3*  ALBUMIN 3.0*   No results for input(s): LIPASE, AMYLASE in the last 168 hours. No results for input(s): AMMONIA in the last 168 hours. Coagulation Profile: Recent Labs  Lab 05/12/20 0313  INR 1.5*   Cardiac Enzymes: No results for input(s): CKTOTAL, CKMB, CKMBINDEX, TROPONINI in the last 168  hours. BNP (last 3 results) No results for input(s): PROBNP in the last 8760 hours. HbA1C: Recent Labs    05/11/20 2016  HGBA1C 8.2*   CBG: Recent Labs  Lab 05/12/20 2107 05/13/20 0738 05/13/20 1115 05/13/20 1618 05/14/20 0727  GLUCAP 107* 122* 132* 113* 141*   Lipid Profile: No results for input(s): CHOL, HDL, LDLCALC, TRIG, CHOLHDL, LDLDIRECT in the last 72 hours. Thyroid Function Tests: Recent Labs    05/11/20 2016  TSH 1.683   Anemia Panel: Recent Labs    05/11/20 2016  VITAMINB12 244   Sepsis Labs: No results for input(s): PROCALCITON, LATICACIDVEN in the last 168 hours.  Recent Results (from the past 240 hour(s))  Urine culture     Status: Abnormal   Collection Time: 05/11/20 11:44 PM   Specimen: Urine, Clean Catch  Result Value Ref Range Status   Specimen Description   Final    URINE, CLEAN CATCH Performed at [facility], [address], [place], [place] [address]    Special Requests   Final    NONE Performed at [facility], [address], [place], [place] [address]    Culture (A)  Final    <10,000 COLONIES/mL INSIGNIFICANT GROWTH Performed at [facility] [address], [place], [place] [address]    Report Status 05/13/2020 FINAL  Final  Respiratory Panel by RT PCR (Flu A&B, Covid) - Nasopharyngeal Swab     Status: None   Collection Time: 05/11/20 11:59 PM   Specimen: Nasopharyngeal Swab  Result Value Ref Range Status   SARS Coronavirus 2 by RT PCR NEGATIVE NEGATIVE Final    Comment: (NOTE) SARS-CoV-2 target nucleic acids are NOT DETECTED.  The SARS-CoV-2 RNA is generally detectable in upper respiratoy specimens during the acute phase of infection. The lowest concentration of SARS-CoV-2 viral copies this assay can detect is 131 copies/mL. A negative result does not preclude SARS-Cov-2 infection and should not be used as the sole basis for treatment or other patient management decisions. A negative result may occur with  improper  specimen collection/handling, submission of specimen other than nasopharyngeal swab, presence of viral mutation(s) within the areas targeted by this assay, and inadequate number of viral copies (<131 copies/mL). A negative result must be combined with clinical observations, patient history, and epidemiological information. The expected result is Negative.  Fact Sheet for Patients:  PinkCheek.be  Fact Sheet for Healthcare Providers:  GravelBags.it  This test is no t yet approved or cleared by the [place] FDA and  has been authorized for detection and/or diagnosis of SARS-CoV-2 by FDA under an Emergency Use Authorization (EUA). This EUA will remain  in effect (  meaning this test can be used) for the duration of the COVID-19 declaration under Section 564(b)(1) of the Act, 21 U.S.C. section 360bbb-3(b)(1), unless the authorization is terminated or revoked sooner.     Influenza A by PCR NEGATIVE NEGATIVE Final   Influenza B by PCR NEGATIVE NEGATIVE Final    Comment: (NOTE) The Xpert Xpress SARS-CoV-2/FLU/RSV assay is intended as an aid in  the diagnosis of influenza from Nasopharyngeal swab specimens and  should not be used as a sole basis for treatment. Nasal washings and  aspirates are unacceptable for Xpert Xpress SARS-CoV-2/FLU/RSV  testing.  Fact Sheet for Patients: PinkCheek.be  Fact Sheet for Healthcare Providers: GravelBags.it  This test is not yet approved or cleared by the [place] FDA and  has been authorized for detection and/or diagnosis of SARS-CoV-2 by  FDA under an Emergency Use Authorization (EUA). This EUA will remain  in effect (meaning this test can be used) for the duration of the  Covid-19 declaration under Section 564(b)(1) of the Act, 21  U.S.C. section 360bbb-3(b)(1), unless the authorization is  terminated or revoked. Performed at  [facility], [address], [place], [place] [address]          Radiology Studies: US RENAL  Result Date: 05/12/2020 CLINICAL DATA:  Acute kidney injury. EXAM: RENAL / URINARY TRACT ULTRASOUND COMPLETE COMPARISON:  Abdominal ultrasound 09/24/2018.  CT 10/02/2018. FINDINGS: Right Kidney: Renal measurements: 12.7 x 6.1 x 6.4 cm = volume: 259.9 mL. Echogenicity within normal limits. No mass or hydronephrosis visualized. Left Kidney: Renal measurements: 13.2 x 6.2 x 5.4 cm = volume: 233.3 mL. Echogenicity within normal limits. No mass or hydronephrosis visualized. Bladder: Mildly distended (volume 546 cc).  No focal abnormality identified. Other: None. IMPRESSION: Normal renal ultrasound. No hydronephrosis. Electronically Signed   By: Richardean Sale M.D.   On: 05/12/2020 13:58        Scheduled Meds:  aspirin EC  81 mg Oral Daily   carvedilol  25 mg Oral BID WC   citalopram  10 mg Oral Daily   digoxin  0.125 mg Oral Daily   hydrALAZINE  25 mg Oral TID   potassium chloride  40 mEq Oral BID   ticagrelor  45 mg Oral BID   Continuous Infusions:  lactated ringers       LOS: 3 days    Time spent: 30 minutes    Jennalyn Cawley Darleen Crocker, DO [facility]  If 7PM-7AM, please contact night-coverage [email or web address] 05/14/2020, 10:55 AM

## 2020-05-15 ENCOUNTER — Telehealth: Payer: Self-pay | Admitting: Student

## 2020-05-15 ENCOUNTER — Other Ambulatory Visit (HOSPITAL_COMMUNITY): Admission: RE | Admit: 2020-05-15 | Payer: 59 | Source: Ambulatory Visit

## 2020-05-15 ENCOUNTER — Inpatient Hospital Stay (HOSPITAL_COMMUNITY): Payer: 59

## 2020-05-15 DIAGNOSIS — R4182 Altered mental status, unspecified: Secondary | ICD-10-CM | POA: Diagnosis not present

## 2020-05-15 LAB — BASIC METABOLIC PANEL
Anion gap: 8 (ref 5–15)
BUN: 9 mg/dL (ref 6–20)
CO2: 23 mmol/L (ref 22–32)
Calcium: 8.4 mg/dL — ABNORMAL LOW (ref 8.9–10.3)
Chloride: 106 mmol/L (ref 98–111)
Creatinine, Ser: 1.41 mg/dL — ABNORMAL HIGH (ref 0.44–1.00)
GFR, Estimated: 42 mL/min — ABNORMAL LOW (ref >60)
Glucose, Bld: 124 mg/dL — ABNORMAL HIGH (ref 70–99)
Potassium: 3.7 mmol/L (ref 3.5–5.1)
Sodium: 137 mmol/L (ref 135–145)

## 2020-05-15 LAB — MAGNESIUM: Magnesium: 1.7 mg/dL (ref 1.7–2.4)

## 2020-05-15 IMAGING — DX DG ABDOMEN 1V
1 series · 1 of 1 positions shown · non-contrast
Comparison: None.

CLINICAL DATA: Altered mental status

EXAM:
ABDOMEN - 1 VIEW

[abdomen supine]
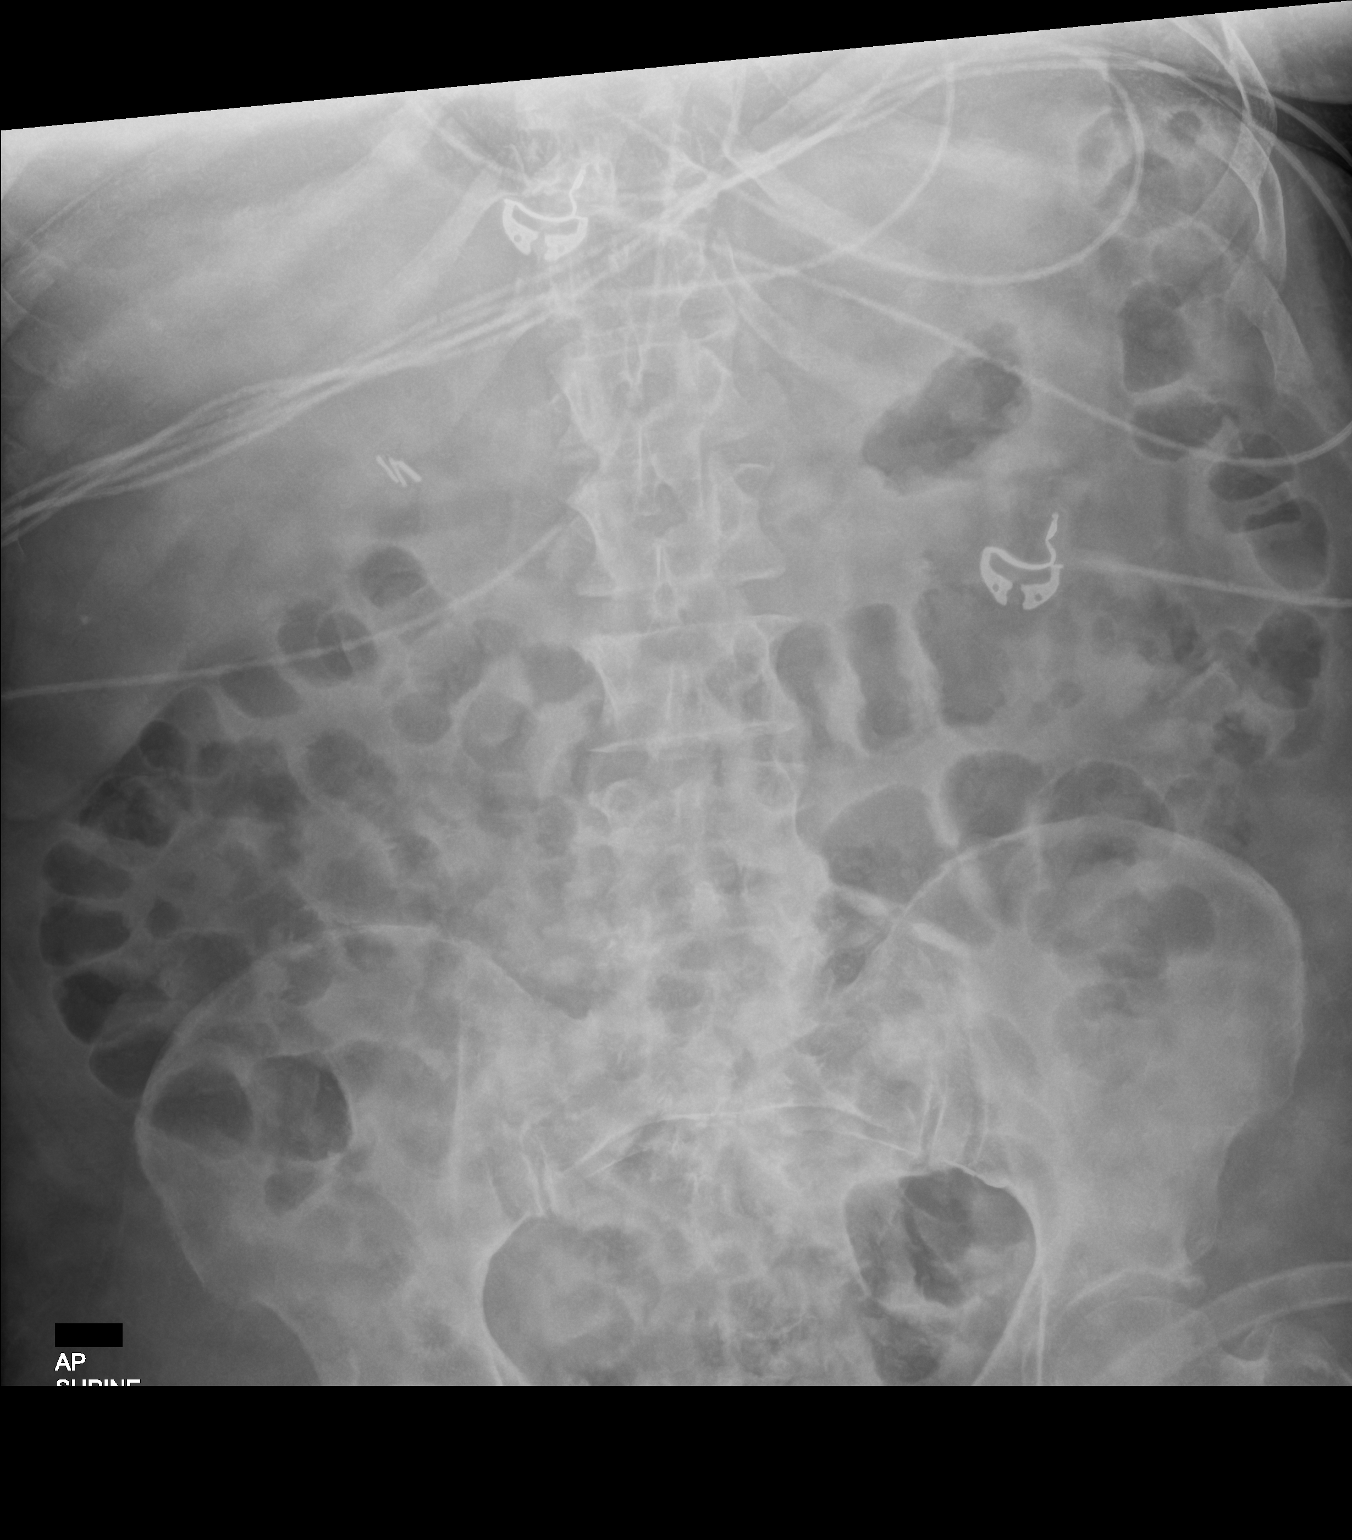

[1 of 1 positions shown; findings below may reference images not displayed]

FINDINGS: Gas in small and large bowel but no abnormal dilated loops. No
abnormal calcifications or significant bone findings.
Cholecystectomy clips.
IMPRESSION: Gas in small and large bowel but without evidence of obstruction or
ileus.

## 2020-05-15 MED ORDER — PANTOPRAZOLE SODIUM 40 MG IV SOLR
40.0000 mg | INTRAVENOUS | Status: DC
  Administered 2020-05-15: 40 mg via INTRAVENOUS
  Filled 2020-05-15: qty 40

## 2020-05-15 NOTE — TOC Progression Note (Signed)
Transition of Care [facility]) - Progression Note    Patient Details  Name: Robin [name] MRN: [id number] Date of Birth: [birth year]  Transition of Care [facility]) CM/SW Contact  Salome Arnt, [place] Phone Number: 05/15/2020, 4:10 PM  Clinical Narrative:  LCSW followed up with pt's sister, [name] with bed offers. Pt has offer at [facility]. [name] said they would like to have pt closer. [facility] unable to offer due to being out of network. [facility] and [facility] are reviewing, but need updated notes. PT notified and notes in now. Ebony Hail at [facility] aware and will follow up. [name] will discuss with pt's children this evening and TOC to follow up tomorrow to finalize disposition and start SNF auth.     Expected Discharge Plan: [place] Barriers to Discharge: Continued Medical Work up, Ship broker  Expected Discharge Plan and Services Expected Discharge Plan: [place] In-house Referral: Clinical Social Work   Post Acute Care Choice: [place] Living arrangements for the past 2 months: [place]                                       Social Determinants of Health (SDOH) Interventions    Readmission Risk Interventions Readmission Risk Prevention Plan 05/12/2020  Transportation Screening Complete  Medication Review Press photographer) Complete  SW Recovery Care/Counseling Consult Complete  Palliative Care Screening Not Applicable  Skilled Nursing Facility Complete  Some recent data might be hidden

## 2020-05-15 NOTE — Telephone Encounter (Signed)
NIR.  Received message from patient's sister, [name], requesting call back from PA/MD regarding her sister's aneurysm procedure. Of note, patient was tentatively scheduled for an image-guided cerebral arteriogram with possible aneurysm embolization 05/17/2020 with Dr. Estanislado Pandy- however patient is currently admitted to [facility] since 05/11/2020 for management of multiple medical issues including AMS, UTI, poor oral intake, prolonged QTc with bradycardia, and AKI. Dr. Estanislado Pandy and Dr. Manuella Ghazi spoke via telephone about patient's above medical issues- at this time it is recommended that patient's procedure be postponed until management of above medical issues is optimized.  Called [name] [phone]) at 1240 to discuss. Patient's sister states that she is confused why procedure is not occurring- states she was told by IR scheduling it was due to renal function, however per Dr. Manuella Ghazi (per patient's sister), patient's renal function is improving- however not back to baseline. Explained that this is not the only reason procedure is being postponed (see above). Explained it would be safest for patient to undergo this procedure after management of above medical issues is optimized. All questions answered and concerns addressed. Patient's sister conveys understanding and agrees with plan.  Please call NIR with questions/concerns.   Bea Graff Genoveva Singleton, PA-C 05/15/2020, 1:01 PM

## 2020-05-15 NOTE — Progress Notes (Signed)
PROGRESS NOTE    Robin [name]  MGQ:[id number] DOB: 19-Jan-1966 DOA: 05/11/2020 PCP: Inda Coke, PA   Brief Narrative:  Per HPI: Robin [name] a 54 y.o.femalewith medical history significant forDM, HTN, atrial fibrillation,anemia, CHF, COPD, ascending aortic aneurysm, CVA with residual right-sided weakness and impaired speech who presentsto theemergency department via EMS due to altered mental status. Patient was unable to provide history, history was obtained from sister at bedside and ED physician,as well as ED medical chart. Per report. Patient was reported to have become altered within last 2 days with decreased eating or drinking of water, she was also reported to complain of right-sided headache within last week. She has an appointment on 10/13 for embolization of her brain aneurysms with Dr Estanislado Pandy. There was no report of fever, chills, chest pain, shortness of breath, nausea, vomiting or diarrhea.  10/8:Patient continues to have some altered mentation with what appears to be a UTI. Brain MRI with no acute findings of CVA noted and 2D echocardiogram with LVEF 70-75%.  10/9-10/11:Patient continues to remain somewhat confused, but appears to be improving. Renal function improving. She is noted to be somewhat bradycardic this a.m. for which digoxin has been held and EKG and digoxin level are being obtained. Urine cultures with insignificant growth thus far. Neurology has evaluated patient with EEG ordered and pending.  She is noted to have some acid reflux, but is starting to eat some more.   Assessment & Plan:   Principal Problem:   Altered mental status Active Problems:   COPD (chronic obstructive pulmonary disease) (HCC)   Essential hypertension, benign   Hypokalemia   AKI (acute kidney injury) (HCC)   Right hemiparesis (HCC)   UTI (urinary tract infection)   Prolonged QT interval   History of CVA (cerebrovascular accident)   Atrial fibrillation,  chronic (HCC)   History of CHF (congestive heart failure)   Hypomagnesemia   Alteredmetabolic encephalopathy likelymultifactorial-improving -Brain MRI with no acute findings of CVA noted -Appreciate neurology evaluation with EEG ordered to rule out seizure -Urine analysis with signs of pyuria suspicious for UTI with culturesdemonstrating insignificant growth -Rocephin discontinued after 3-day course of treatment -TSH 1.6 -2D echocardiogram 70-75% -PT evaluationwith recommendation for SNF for which patient has agreeable -Patient started on dysphagia 3 diet and appears to be tolerating well -Continue ongoing management as ordered with potential need for placement  Presumed UTI POA Urinalysis was positive for large leukocytes and WBC>50but rare bacteria Cultures with insignificant growth Rocephin discontinued after 3 total doses  Poor oral intake likely secondary to indigestion Continue Maalox with Protonix ordered today Continue to monitor intake KUB ordered for further evaluation  Prolonged QTc, now with bradycardia QTc623ms Avoid QT prolonging drugs Repeat EKG and digoxin level pending  Acute kidney injury-improving BUN to creatinine 21/2.78on admission(baseline creatinine 1.1-1.3). Current creatinine 1.4 Renally adjust medications, avoid nephrotoxic agents/dehydration/hypotension -Continue on aggressive IV fluid and recheck labs in a.m. -Check urine electrolytes and renal ultrasoundwith no acute findings -Monitor I's and O's  COPD (not in acute exacerbation) No medication against COPD noted on med rec -No acute bronchospasms noted  History of CVA with residual right-sided weakness Home meds will be resumed once patient is cleared for oral intake  A. fib with rate control EKG presents with A. fib with rate control Continue telemetry Coreg and digoxin with holding parameters due to bradycardia -Repeat EKG on 10/9 pending along with digoxin  level  Essential hypertension (controlled) Continue home blood pressure agents  History of CHF  2D echocardiogram with LVEF 70-75% and no other acute findings   DVT prophylaxis:SCDs Code Status:Full code Family Communication:Discussed withsister on 10/11 Disposition Plan: Status is: Inpatient  Remains inpatient appropriate because:Altered mental status, IV treatments appropriate due to intensity of illness or inability to take PO and Inpatient level of care appropriate due to severity of illness   Dispo: The patient is from:Home Anticipated d/c is to:SNF Anticipated d/c date is: 1-2 days Patient currently is not medically stable to d/c. EEG results are currently pending to ensure there is no seizure activity prior to discharge.   Consultants:  Neurology  Procedures:  EEG performed 10/8 with results pending  Antimicrobials:  Anti-infectives (From admission, onward)   Start     Dose/Rate Route Frequency Ordered Stop   05/13/20 0000  cefTRIAXone (ROCEPHIN) 1 g in sodium chloride 0.9 % 100 mL IVPB  Status:  Discontinued        1 g 200 mL/hr over 30 Minutes Intravenous Every 24 hours 05/12/20 1205 05/14/20 0845   05/12/20 2200  cefTRIAXone (ROCEPHIN) injection 1 g  Status:  Discontinued        1 g Intramuscular Every 24 hours 05/12/20 0244 05/12/20 1205   05/11/20 2345  cefTRIAXone (ROCEPHIN) 1 g in sodium chloride 0.9 % 100 mL IVPB        1 g 200 mL/hr over 30 Minutes Intravenous  Once 05/11/20 2344 05/12/20 0549       Subjective: Patient seen and evaluated today with improving oral intake noted.  Objective: Vitals:   05/14/20 1345 05/14/20 1511 05/14/20 2051 05/15/20 0552  BP: 132/62 136/81 123/72 (!) 154/70  Pulse: 64 69 (!) 59 (!) 58  Resp: 18  20 20   Temp: 98.4 F (36.9 C) 97.6 F (36.4 C) 97.9 F (36.6 C) 98.2 F (36.8 C)  TempSrc: Oral  Oral Oral  SpO2:  100% 99% 100%  Weight:      Height:         Intake/Output Summary (Last 24 hours) at 05/15/2020 1217 Last data filed at 05/15/2020 0500 Gross per 24 hour  Intake --  Output 800 ml  Net -800 ml   Filed Weights   05/11/20 1953  Weight: 90.7 kg    Examination:  General exam: Appears calm and comfortable, minimally confused Respiratory system: Clear to auscultation. Respiratory effort normal. Cardiovascular system: S1 & S2 heard, RRR. Gastrointestinal system: Abdomen is nondistended, soft and nontender.  Central nervous system: Alert and awake Extremities: No edema Skin: No rashes, lesions or ulcers Psychiatry: Difficult to assess    Data Reviewed: I have personally reviewed following labs and imaging studies  CBC: Recent Labs  Lab 05/11/20 2016 05/12/20 0313 05/13/20 0624 05/14/20 0643  WBC 10.8* 8.8 8.4 8.8  HGB 11.6* 10.0* 10.5* 10.4*  HCT 34.8* 29.5* 31.7* 31.4*  MCV 84.3 84.3 84.5 84.4  PLT 333 250 265 588   Basic Metabolic Panel: Recent Labs  Lab 05/11/20 2016 05/11/20 2016 05/12/20 0313 05/12/20 0913 05/13/20 0624 05/13/20 0700 05/14/20 0643 05/15/20 0618  NA 134*   < > 135 136 136  --  135 137  K 2.9*   < > 3.2* 3.2* 3.8  --  3.1* 3.7  CL 96*   < > 103 103 105  --  102 106  CO2 23   < > 20* 22 20*  --  24 23  GLUCOSE 123*   < > 114* 113* 120*  --  137* 124*  BUN 21*   < >  19 18 14   --  11 9  CREATININE 2.78*   < > 2.38* 2.39* 1.99*  --  1.61* 1.41*  CALCIUM 8.7*   < > 7.7* 8.0* 8.4*  --  8.5* 8.4*  MG 1.3*  --  1.2*  --   --  2.1 1.7 1.7  PHOS  --   --  2.2*  --   --   --   --   --    < > = values in this interval not displayed.   GFR: Estimated Creatinine Clearance: 53.3 mL/min (A) (by C-G formula based on SCr of 1.41 mg/dL (H)). Liver Function Tests: Recent Labs  Lab 05/12/20 0313  AST 9*  ALT 6  ALKPHOS 50  BILITOT 0.7  PROT 6.3*  ALBUMIN 3.0*   No results for input(s): LIPASE, AMYLASE in the last 168 hours. No results for input(s): AMMONIA in the last 168  hours. Coagulation Profile: Recent Labs  Lab 05/12/20 0313  INR 1.5*   Cardiac Enzymes: No results for input(s): CKTOTAL, CKMB, CKMBINDEX, TROPONINI in the last 168 hours. BNP (last 3 results) No results for input(s): PROBNP in the last 8760 hours. HbA1C: No results for input(s): HGBA1C in the last 72 hours. CBG: Recent Labs  Lab 05/13/20 1115 05/13/20 1618 05/14/20 0727 05/14/20 1122 05/14/20 1625  GLUCAP 132* 113* 141* 150* 126*   Lipid Profile: No results for input(s): CHOL, HDL, LDLCALC, TRIG, CHOLHDL, LDLDIRECT in the last 72 hours. Thyroid Function Tests: No results for input(s): TSH, T4TOTAL, FREET4, T3FREE, THYROIDAB in the last 72 hours. Anemia Panel: No results for input(s): VITAMINB12, FOLATE, FERRITIN, TIBC, IRON, RETICCTPCT in the last 72 hours. Sepsis Labs: No results for input(s): PROCALCITON, LATICACIDVEN in the last 168 hours.  Recent Results (from the past 240 hour(s))  Urine culture     Status: Abnormal   Collection Time: 05/11/20 11:44 PM   Specimen: Urine, Clean Catch  Result Value Ref Range Status   Specimen Description   Final    URINE, CLEAN CATCH Performed at [facility], [address], [place], [place] [address]    Special Requests   Final    NONE Performed at [facility], [address], [place], [place] [address]    Culture (A)  Final    <10,000 COLONIES/mL INSIGNIFICANT GROWTH Performed at [facility] [address], [place], [place] [address]    Report Status 05/13/2020 FINAL  Final  Respiratory Panel by RT PCR (Flu A&B, Covid) - Nasopharyngeal Swab     Status: None   Collection Time: 05/11/20 11:59 PM   Specimen: Nasopharyngeal Swab  Result Value Ref Range Status   SARS Coronavirus 2 by RT PCR NEGATIVE NEGATIVE Final    Comment: (NOTE) SARS-CoV-2 target nucleic acids are NOT DETECTED.  The SARS-CoV-2 RNA is generally detectable in upper respiratoy specimens during the acute phase of infection. The lowest concentration  of SARS-CoV-2 viral copies this assay can detect is 131 copies/mL. A negative result does not preclude SARS-Cov-2 infection and should not be used as the sole basis for treatment or other patient management decisions. A negative result may occur with  improper specimen collection/handling, submission of specimen other than nasopharyngeal swab, presence of viral mutation(s) within the areas targeted by this assay, and inadequate number of viral copies (<131 copies/mL). A negative result must be combined with clinical observations, patient history, and epidemiological information. The expected result is Negative.  Fact Sheet for Patients:  PinkCheek.be  Fact Sheet for  Healthcare Providers:  GravelBags.it  This test is no t yet approved or cleared by the [place] and  has been authorized for detection and/or diagnosis of SARS-CoV-2 by FDA under an Emergency Use Authorization (EUA). This EUA will remain  in effect (meaning this test can be used) for the duration of the COVID-19 declaration under Section 564(b)(1) of the Act, 21 U.S.C. section 360bbb-3(b)(1), unless the authorization is terminated or revoked sooner.     Influenza A by PCR NEGATIVE NEGATIVE Final   Influenza B by PCR NEGATIVE NEGATIVE Final    Comment: (NOTE) The Xpert Xpress SARS-CoV-2/FLU/RSV assay is intended as an aid in  the diagnosis of influenza from Nasopharyngeal swab specimens and  should not be used as a sole basis for treatment. Nasal washings and  aspirates are unacceptable for Xpert Xpress SARS-CoV-2/FLU/RSV  testing.  Fact Sheet for Patients: PinkCheek.be  Fact Sheet for Healthcare Providers: GravelBags.it  This test is not yet approved or cleared by the [place] FDA and  has been authorized for detection and/or diagnosis of SARS-CoV-2 by  FDA under an Emergency Use  Authorization (EUA). This EUA will remain  in effect (meaning this test can be used) for the duration of the  Covid-19 declaration under Section 564(b)(1) of the Act, 21  U.S.C. section 360bbb-3(b)(1), unless the authorization is  terminated or revoked. Performed at [facility], [address], [place], [place] [address]          Radiology Studies: No results found.      Scheduled Meds: . aspirin EC  81 mg Oral Daily  . carvedilol  25 mg Oral BID WC  . citalopram  10 mg Oral Daily  . feeding supplement (ENSURE ENLIVE)  237 mL Oral BID BM  . hydrALAZINE  25 mg Oral TID  . pantoprazole (PROTONIX) IV  40 mg Intravenous Q24H  . ticagrelor  45 mg Oral BID    LOS: 4 days    Time spent: 30 minutes    Guthrie Lemme Darleen Crocker, DO [facility]  If 7PM-7AM, please contact night-coverage [email or web address] 05/15/2020, 12:17 PM

## 2020-05-15 NOTE — Progress Notes (Signed)
Physical Therapy Treatment Patient Details Name: Robin [name] MRN: [id number] DOB: [birth year] Today's Date: 05/15/2020    History of Present Illness Robin [name] is a 54 y.o. female with medical history significant for DM, HTN, atrial fibrillation,  anemia, CHF, COPD, ascending aortic aneurysm, CVA with residual right-sided weakness and impaired speech who presents to the  emergency department via EMS due to altered mental status. Patient was unable to provide history, history was obtained from sister at bedside and ED physician, as well as ED medical chart. Per report. Patient was reported to have become altered within last 2 days with decreased eating or drinking of water, she was also reported to complain of right-sided headache within last week. She has an appointment on 10/13 for embolization of her brain aneurysms with Dr Estanislado Pandy. There was no report of fever, chills, chest pain, shortness of breath, nausea, vomiting or diarrhea.    PT Comments    Patient demonstrates increased endurance/distance for gait training without loss of balance, slightly unsteady requiring occasional hand held assist for safety when making turns, limited mostly due to fatigue and right sided weakness.  Patient tolerated sitting up in chair after therapy - nursing staff notified.  Patient will benefit from continued physical therapy in hospital and recommended venue below to increase strength, balance, endurance for safe ADLs and gait.   Follow Up Recommendations  SNF;Supervision - Intermittent;Supervision for mobility/OOB     Equipment Recommendations  Other (comment)    Recommendations for Other Services       Precautions / Restrictions Precautions Precautions: Fall Restrictions Weight Bearing Restrictions: No    Mobility  Bed Mobility   Bed Mobility: Supine to Sit     Supine to sit: Min assist     General bed mobility comments: slow labored movement, had to use LUE to assist in pulling  self to sitting  Transfers Overall transfer level: Needs assistance Equipment used: 1 Claw hand held assist Transfers: Sit to/from Stand;Stand Pivot Transfers Sit to Stand: Min guard Stand pivot transfers: Min guard       General transfer comment: increased time, labored movement  Ambulation/Gait Ambulation/Gait assistance: Min guard;Min assist Gait Distance (Feet): 50 Feet Assistive device: 1 Heinicke hand held assist Gait Pattern/deviations: Decreased step length - right;Decreased step length - left;Decreased stride length;Decreased stance time - left Gait velocity: decreased   General Gait Details: slightly unsteady labored cadence with decreased RLE step/stride length due weakness and decreased active ankle dorsiflexion   Stairs             Wheelchair Mobility    Modified Rankin (Stroke Patients Only)       Balance Overall balance assessment: Needs assistance Sitting-balance support: Feet supported;No upper extremity supported Sitting balance-Leahy Scale: Good Sitting balance - Comments: seated at EOB   Standing balance support: During functional activity;No upper extremity supported Standing balance-Leahy Scale: Fair Standing balance comment: without AD                            Cognition Arousal/Alertness: Awake/alert Behavior During Therapy: WFL for tasks assessed/performed Overall Cognitive Status: Within Functional Limits for tasks assessed                                        Exercises General Exercises - Lower Extremity Long Arc Quad: Seated;AROM;Strengthening;Both;15 reps Hip Flexion/Marching: Seated;AROM;Strengthening;Both;15 reps Toe  Raises: Seated;AROM;Strengthening;Both;15 reps Heel Raises: Seated;AROM;Strengthening;Both;15 reps    General Comments        Pertinent Vitals/Pain Pain Assessment: No/denies pain    Home Living                      Prior Function            PT Goals (current  goals can now be found in the care plan section) Acute Rehab PT Goals Patient Stated Goal: return home with family to assist PT Goal Formulation: With patient Time For Goal Achievement: 05/26/20 Potential to Achieve Goals: Good Progress towards PT goals: Progressing toward goals    Frequency    Min 3X/week      PT Plan Current plan remains appropriate    Co-evaluation              AM-PAC PT "6 Clicks" Mobility   Outcome Measure  Help needed turning from your back to your side while in a flat bed without using bedrails?: A Little Help needed moving from lying on your back to sitting on the side of a flat bed without using bedrails?: A Little Help needed moving to and from a bed to a chair (including a wheelchair)?: A Little Help needed standing up from a chair using your arms (e.g., wheelchair or bedside chair)?: A Little Help needed to walk in hospital room?: A Little Help needed climbing 3-5 steps with a railing? : A Lot 6 Click Score: 17    End of Session   Activity Tolerance: Patient tolerated treatment well;Patient limited by fatigue Patient left: in chair;with call bell/phone within reach Nurse Communication: Mobility status PT Visit Diagnosis: Unsteadiness on feet (R26.81);Other abnormalities of gait and mobility (R26.89);Muscle weakness (generalized) (M62.81)     Time: 6734-1937 PT Time Calculation (min) (ACUTE ONLY): 20 min  Charges:  $Gait Training: 8-22 mins $Therapeutic Exercise: 8-22 mins                     3:59 PM, 05/15/20 Lonell Grandchild, MPT Physical Therapist with [facility] [phone] office [phone] mobile phone

## 2020-05-16 DIAGNOSIS — N390 Urinary tract infection, site not specified: Principal | ICD-10-CM

## 2020-05-16 LAB — BASIC METABOLIC PANEL
Anion gap: 10 (ref 5–15)
BUN: 9 mg/dL (ref 6–20)
CO2: 22 mmol/L (ref 22–32)
Calcium: 8.7 mg/dL — ABNORMAL LOW (ref 8.9–10.3)
Chloride: 104 mmol/L (ref 98–111)
Creatinine, Ser: 1.45 mg/dL — ABNORMAL HIGH (ref 0.44–1.00)
GFR, Estimated: 41 mL/min — ABNORMAL LOW (ref >60)
Glucose, Bld: 206 mg/dL — ABNORMAL HIGH (ref 70–99)
Potassium: 3.8 mmol/L (ref 3.5–5.1)
Sodium: 136 mmol/L (ref 135–145)

## 2020-05-16 LAB — RESPIRATORY PANEL BY RT PCR (FLU A&B, COVID)
Influenza A by PCR: NEGATIVE
Influenza B by PCR: NEGATIVE
SARS Coronavirus 2 by RT PCR: NEGATIVE

## 2020-05-16 LAB — MAGNESIUM: Magnesium: 1.7 mg/dL (ref 1.7–2.4)

## 2020-05-16 MED ORDER — ENSURE ENLIVE PO LIQD: 237.0000 mL | Freq: Two times a day (BID) | ORAL | 12 refills | Status: DC

## 2020-05-16 MED ORDER — PANTOPRAZOLE SODIUM 40 MG PO TBEC
40.0000 mg | DELAYED_RELEASE_TABLET | Freq: Every day | ORAL | Status: DC
  Administered 2020-05-16 – 2020-05-18 (×3): 40 mg via ORAL
  Filled 2020-05-16 (×3): qty 1

## 2020-05-16 MED ORDER — CARVEDILOL 25 MG PO TABS: 25.0000 mg | ORAL_TABLET | Freq: Two times a day (BID) | ORAL | 0 refills | Status: DC

## 2020-05-16 MED ORDER — ALUM & MAG HYDROXIDE-SIMETH 200-200-20 MG/5ML PO SUSP: 30.0000 mL | Freq: Four times a day (QID) | ORAL | 0 refills | Status: DC | PRN

## 2020-05-16 MED ORDER — PANTOPRAZOLE SODIUM 40 MG PO TBEC: 40.0000 mg | DELAYED_RELEASE_TABLET | Freq: Every day | ORAL | 0 refills | Status: DC

## 2020-05-16 NOTE — Procedures (Signed)
  [place] A. Merlene Laughter, MD     [email or web address]           HISTORY: This is a 54 year old who presents with confusion and encephalopathy. There is concerned that the encephalopathy could be due to nonconvulsive seizures.  MEDICATIONS:  Current Facility-Administered Medications:  .  alum & mag hydroxide-simeth (MAALOX/MYLANTA) 200-200-20 MG/5ML suspension 30 mL, 30 mL, Oral, Q6H PRN, Manuella Ghazi, Pratik D, DO, 30 mL at 05/15/20 2103 .  aspirin EC tablet 81 mg, 81 mg, Oral, Daily, Heath Lark D, DO, 81 mg at 05/16/20 0905 .  carvedilol (COREG) tablet 25 mg, 25 mg, Oral, BID WC, Shah, Pratik D, DO, 25 mg at 05/16/20 1616 .  citalopram (CELEXA) tablet 10 mg, 10 mg, Oral, Daily, Manuella Ghazi, Pratik D, DO, 10 mg at 05/16/20 0904 .  feeding supplement (ENSURE ENLIVE) (ENSURE ENLIVE) liquid 237 mL, 237 mL, Oral, BID BM, Manuella Ghazi, Pratik D, DO, 237 mL at 05/16/20 0908 .  hydrALAZINE (APRESOLINE) tablet 25 mg, 25 mg, Oral, TID, Manuella Ghazi, Pratik D, DO, 25 mg at 05/16/20 1616 .  ondansetron (ZOFRAN) injection 4 mg, 4 mg, Intravenous, Q6H PRN, Manuella Ghazi, Pratik D, DO, 4 mg at 05/12/20 1901 .  pantoprazole (PROTONIX) EC tablet 40 mg, 40 mg, Oral, Daily, Manuella Ghazi, Pratik D, DO, 40 mg at 05/16/20 1226 .  ticagrelor (BRILINTA) tablet 45 mg, 45 mg, Oral, BID, Manuella Ghazi, Pratik D, DO, 45 mg at 05/16/20 0904     ANALYSIS: A 16 channel recording using standard 10 20 measurements is conducted for 24 minutes.  The background activity is that of mostly of a theta your slowing of 7 hertz for most of the recording. Occasionally there is alpha activity noted of the 11 hertz on the right and 10 hertz on the left. Myogenic interference is noted and appropriate. Focal slowing is not noted. No epileptiform activity is noted.   IMPRESSION: 1. Overall mild global slowing is noted indicating a mild global encephalopathy. 2. There is slowing on the left hemisphere relative to the contralateral side. This suggests hemispheric  dysfunction on the left side and likely related to the known history of a left hemispheric stroke. 3.  No epileptiform discharges are noted.      Skylen Spiering A. Merlene Laughter, M.D.  Diplomate, Tax adviser of Psychiatry and Neurology ( Neurology).

## 2020-05-16 NOTE — Progress Notes (Signed)
Chaplain engaged in follow-up visit with [name].  Chaplain was able to speak with Dereon's daughter yesterday briefly.  Chaplain offered support and explained her role.  Chaplain briefly checked in on Ms. [name] this morning.  Chaplain will follow-up.     05/16/20 1100  Clinical Encounter Type  Visited With Patient  Visit Type Follow-up

## 2020-05-16 NOTE — Discharge Summary (Addendum)
Physician Discharge Summary  Robin [name] AVW:[id number] DOB: 14-May-1966 DOA: 05/11/2020  PCP: Robin Coke, PA  Admit date: 05/11/2020  Discharge date: 05/18/2020  Admitted From:Home  Disposition:  SNF  Recommendations for Outpatient Follow-up:  1. Follow up with PCP in 1-2 weeks 2. Please obtain BMP/CBC in one week 3. Remain on medications as noted below.  Please resume Levemir at lower doses as well as home Metformin if appropriate and blood glucose beginning to elevate 4. Please resume torsemide for any edema and worsening weight gain and digoxin for heart rate elevations which has currently been held given some bradycardia.  Follow-up with cardiology Dr. Harl Bowie as recommended in 2 weeks. 5. Follow-up with Dr. Estanislado Pandy for rescheduling of brain aneurysm procedure  Home Health: None  Equipment/Devices: None  Discharge Condition: Stable and improved, discharge to SNF for further care and rehab.  CODE STATUS: Full  Diet recommendation: Heart Healthy/carb modified  Brief/Interim Summary: Per HPI: Robin [name] a 54 y.o.femalewith medical history significant forDM, HTN, atrial fibrillation,anemia, CHF, COPD, ascending aortic aneurysm, CVA with residual right-sided weakness and impaired speech who presentsto theemergency department via EMS due to altered mental status. Patient was unable to provide history, history was obtained from sister at bedside and ED physician,as well as ED medical chart. Per report. Patient was reported to have become altered within last 2 days with decreased eating or drinking of water, she was also reported to complain of right-sided headache within last week. She previously had an appointment on 10/13 for embolization of her brain aneurysms with Dr Estanislado Pandy; patient will need new appointment with IR after discharge.   10/8:Patient was admitted with acute metabolic encephalopathy multifactorial in the setting of UTI as well as AKI with  poor oral intake.  Patient continues to have some altered mentation with what appears to be a UTI related. Brain MRI with no acute findings of CVA noted and 2D echocardiogram with LVEF 70-75%.  10/9-10/11:Patient continues to remain somewhat confused, but appears to be improving. Renal function improving. She is noted to be somewhat bradycardic this a.m. for which digoxin has been held and EKG and digoxin level are being obtained. Urine cultures with insignificant growth thus far. Neurology has evaluated patient with EEG demonstrating encephalopathic changes, but not frank epileptic abnormalities.  10/12: Patient is back to her baseline level of mentation with no other acute events or concerns noted throughout the course of this admission.  Creatinine levels are stable and she has finished her course of antibiotics.  She will continue on medications as noted below and will need to follow-up with cardiology as well as with IR in the near future as noted above.  She is stable for discharge to SNF per PT recommendations.  10/13--10/14: Remain medically stable and at this moment medically stable and ready to discharge to skilled nursing facility for further care and rehabilitation.  Discharge Diagnoses:  Principal Problem:   Altered mental status Active Problems:   COPD (chronic obstructive pulmonary disease) (HCC)   Essential hypertension, benign   Hypokalemia   AKI (acute kidney injury) ([place])   Right hemiparesis (HCC)   UTI (urinary tract infection)   Prolonged QT interval   History of CVA (cerebrovascular accident)   Atrial fibrillation, chronic (HCC)   History of CHF (congestive heart failure)   Hypomagnesemia  Principal discharge diagnosis: Acute metabolic encephalopathy-multifactorial in setting of UTI as well as AKI with poor oral intake related to indigestion.  Discharge Instructions  Discharge Instructions  Diet - low sodium heart healthy   Complete by: As directed     Increase activity slowly   Complete by: As directed      Allergies as of 05/16/2020      Reactions   Bee Venom Shortness Of Breath, Swelling   Bodily Swelling   Losartan Other (See Comments)   Nosebleeds per patient report.    Naproxen Other (See Comments)   Acid reflux   Penicillins Nausea Only   Has patient had a PCN reaction causing immediate rash, facial/tongue/throat swelling, SOB or lightheadedness with hypotension: no Has patient had a PCN reaction causing severe rash involving mucus membranes or skin necrosis: no Has patient had a PCN reaction that required hospitalization no Has patient had a PCN reaction occurring within the last 10 years: no If all of the above answers are "NO", then may proceed with Cephalosporin    Lisinopril    Sinus congestion      Medication List    STOP taking these medications   apixaban 5 MG Tabs tablet Commonly known as: ELIQUIS   digoxin 0.125 MG tablet Commonly known as: LANOXIN   enoxaparin 150 MG/ML injection Commonly known as: Lovenox   insulin glargine 100 UNIT/ML Solostar Pen Commonly known as: LANTUS   metFORMIN 1000 MG tablet Commonly known as: GLUCOPHAGE   tiZANidine 2 MG tablet Commonly known as: ZANAFLEX   torsemide 20 MG tablet Commonly known as: DEMADEX     TAKE these medications   acetaminophen 650 MG CR tablet Commonly known as: TYLENOL Take 650 mg by mouth every 8 (eight) hours as needed for pain.   alum & mag hydroxide-simeth 200-200-20 MG/5ML suspension Commonly known as: MAALOX/MYLANTA Take 30 mLs by mouth every 6 (six) hours as needed for indigestion or heartburn.   aspirin EC 81 MG tablet Take 81 mg by mouth daily. Swallow whole.   BD Pen Needle Nano U/F 32G X 4 MM Misc Generic drug: Insulin Pen Needle Inject into the skin as directed.   carvedilol 25 MG tablet Commonly known as: Coreg Take 1 tablet (25 mg total) by mouth 2 (two) times daily with a meal. Hold for HR<70, SBP<100 What changed:  additional instructions   citalopram 10 MG tablet Commonly known as: CELEXA Take 1 tablet by mouth once daily   feeding supplement (ENSURE ENLIVE) Liqd Take 237 mLs by mouth 2 (two) times daily between meals.   gabapentin 100 MG capsule Commonly known as: NEURONTIN Take 1 capsule (100 mg total) by mouth 3 (three) times daily.   hydrALAZINE 25 MG tablet Commonly known as: APRESOLINE Take 1 tablet (25 mg total) by mouth 3 (three) times daily.   isosorbide mononitrate 30 MG 24 hr tablet Commonly known as: IMDUR Take 2 tablets (60 mg total) by mouth daily.   nitroGLYCERIN 0.4 MG SL tablet Commonly known as: NITROSTAT Place 1 tablet (0.4 mg total) under the tongue every 5 (five) minutes as needed for chest pain.   nystatin ointment Commonly known as: MYCOSTATIN Apply to affected area 1-2 times daily   pantoprazole 40 MG tablet Commonly known as: PROTONIX Take 1 tablet (40 mg total) by mouth daily.   rosuvastatin 20 MG tablet Commonly known as: CRESTOR Take 1 tablet by mouth once daily   ticagrelor 90 MG Tabs tablet Commonly known as: BRILINTA Take 45 mg by mouth 2 (two) times daily.       Follow-up Information    Robin [name], [place] Follow up in 1 week(s).  Specialty: Physician Assistant Contact information: [place] [address] [phone]              Allergies  Allergen Reactions  . Bee Venom Shortness Of Breath and Swelling    Bodily Swelling  . Losartan Other (See Comments)    Nosebleeds per patient report.   . Naproxen Other (See Comments)    Acid reflux  . Penicillins Nausea Only    Has patient had a PCN reaction causing immediate rash, facial/tongue/throat swelling, SOB or lightheadedness with hypotension: no Has patient had a PCN reaction causing severe rash involving mucus membranes or skin necrosis: no Has patient had a PCN reaction that required hospitalization no Has patient had a PCN reaction occurring within the  last 10 years: no If all of the above answers are "NO", then may proceed with Cephalosporin   . Lisinopril     Sinus congestion    Consultations:  Neurology   Procedures/Studies: DG Abd 1 View  Result Date: 05/15/2020 CLINICAL DATA:  Altered mental status EXAM: ABDOMEN - 1 VIEW COMPARISON:  None. FINDINGS: Gas in small and large bowel but no abnormal dilated loops. No abnormal calcifications or significant bone findings. Cholecystectomy clips. IMPRESSION: Gas in small and large bowel but without evidence of obstruction or ileus. Electronically Signed   By: Nelson Chimes M.D.   On: 05/15/2020 14:21   CT Head Wo Contrast  Result Date: 05/11/2020 CLINICAL DATA:  Right arm deficit EXAM: CT HEAD WITHOUT CONTRAST TECHNIQUE: Contiguous axial images were obtained from the base of the skull through the vertex without intravenous contrast. COMPARISON:  March 19, 2020 FINDINGS: Brain: Large area of encephalomalacia involving the left frontotemporal lobe as on prior exam. There is ex vacuo dilatation of the anterior horn of the left lateral ventricle. No acute loss of gray-white differentiation or extra-axial collections are seen. There is mild low-attenuation changes in the deep white matter consistent with small vessel ischemia. Ventricles are normal in size and contour. Vascular: No hyperdense vessel or unexpected calcification. Skull: The skull is intact. No fracture or focal lesion identified. Sinuses/Orbits: The visualized paranasal sinuses and mastoid air cells are clear. The orbits and globes intact. Other: None IMPRESSION: No acute intracranial abnormality. Stable area of encephalomalacia involving the left frontotemporal lobe. Electronically Signed   By: Prudencio Pair M.D.   On: 05/11/2020 20:46   MR ANGIO HEAD WO CONTRAST  Result Date: 05/12/2020 CLINICAL DATA:  Altered mental status, headache, history of aneurysm EXAM: MRI HEAD WITHOUT CONTRAST MRA HEAD WITHOUT CONTRAST MRA NECK WITHOUT AND  WITH CONTRAST TECHNIQUE: Multiplanar, multiecho pulse sequences of the brain and surrounding structures were obtained without intravenous contrast. Angiographic images of the Circle of Willis were obtained using MRA technique without intravenous contrast. Angiographic images of the neck were obtained using MRA technique without and with intravenous contrast. Carotid stenosis measurements (when applicable) are obtained utilizing NASCET criteria, using the distal internal carotid diameter as the denominator. CONTRAST:  21mL GADAVIST GADOBUTROL 1 MMOL/ML IV SOLN COMPARISON:  MRI 12/27/2019, correlation made with CT head 05/11/2020, prior angiogram FINDINGS: MRI HEAD Brain: There is no acute infarction. Extensive encephalomalacia and chronic blood products are present in the anterior/inferior left MCA territory involving frontal lobe, insula, temporal lobe, and basal ganglia with some parietal extension. Additional small chronic cortical infarct in the right posterior temporal region with chronic blood products. There is ex vacuo dilatation of the left lateral ventricle. There is no intracranial mass, mass effect, or edema.  There is no hydrocephalus or extra-axial fluid collection. Vascular: Better evaluated on MRA portion. Skull and upper cervical spine: Normal marrow signal is preserved. Sinuses/Orbits: Paranasal sinuses are aerated. Orbits are unremarkable. Other: Sella is unremarkable.  Mastoid air cells are clear. MRA HEAD Intracranial internal carotid arteries are patent. Middle and anterior cerebral arteries are patent. Duplicated anterior communicating artery is noted. Intracranial vertebral arteries, basilar artery, posterior cerebral arteries are patent. Intracranial right vertebral artery is patent. Intracranial left vertebral artery demonstrates diminished flow related enhancement proximally with preserved enhancement on post-contrast neck MRA. Basilar artery and posterior cerebral arteries are patent. A left  posterior communicating artery is identified. Aneurysms again identified arising from the right paraclinoid ICA, communicating segment of the left ICA, and left A1-A2 junction. The left MCA bifurcation aneurysm seen on angiogram is not identified. Likely no substantial change in size. MRA NECK Common, internal, and external carotid arteries are patent. There is no measurable stenosis at the ICA origins. Extracranial vertebral arteries are patent. Right vertebral artery is dominant. There is atherosclerotic irregularity of the proximal left vertebral artery with areas of mild and moderate stenosis. There is moderate stenosis of the proximal right V1 vertebral artery. IMPRESSION: No acute abnormality.  Chronic left MCA infarction. Three of four aneurysms seen on prior catheter angiogram are again identified and are likely without substantial change. Extracranial vertebral artery atherosclerosis, left greater than right. Electronically Signed   By: Macy Mis M.D.   On: 05/12/2020 09:06   MR ANGIO NECK W WO CONTRAST  Result Date: 05/12/2020 CLINICAL DATA:  Altered mental status, headache, history of aneurysm EXAM: MRI HEAD WITHOUT CONTRAST MRA HEAD WITHOUT CONTRAST MRA NECK WITHOUT AND WITH CONTRAST TECHNIQUE: Multiplanar, multiecho pulse sequences of the brain and surrounding structures were obtained without intravenous contrast. Angiographic images of the Circle of Willis were obtained using MRA technique without intravenous contrast. Angiographic images of the neck were obtained using MRA technique without and with intravenous contrast. Carotid stenosis measurements (when applicable) are obtained utilizing NASCET criteria, using the distal internal carotid diameter as the denominator. CONTRAST:  40mL GADAVIST GADOBUTROL 1 MMOL/ML IV SOLN COMPARISON:  MRI 12/27/2019, correlation made with CT head 05/11/2020, prior angiogram FINDINGS: MRI HEAD Brain: There is no acute infarction. Extensive encephalomalacia and  chronic blood products are present in the anterior/inferior left MCA territory involving frontal lobe, insula, temporal lobe, and basal ganglia with some parietal extension. Additional small chronic cortical infarct in the right posterior temporal region with chronic blood products. There is ex vacuo dilatation of the left lateral ventricle. There is no intracranial mass, mass effect, or edema. There is no hydrocephalus or extra-axial fluid collection. Vascular: Better evaluated on MRA portion. Skull and upper cervical spine: Normal marrow signal is preserved. Sinuses/Orbits: Paranasal sinuses are aerated. Orbits are unremarkable. Other: Sella is unremarkable.  Mastoid air cells are clear. MRA HEAD Intracranial internal carotid arteries are patent. Middle and anterior cerebral arteries are patent. Duplicated anterior communicating artery is noted. Intracranial vertebral arteries, basilar artery, posterior cerebral arteries are patent. Intracranial right vertebral artery is patent. Intracranial left vertebral artery demonstrates diminished flow related enhancement proximally with preserved enhancement on post-contrast neck MRA. Basilar artery and posterior cerebral arteries are patent. A left posterior communicating artery is identified. Aneurysms again identified arising from the right paraclinoid ICA, communicating segment of the left ICA, and left A1-A2 junction. The left MCA bifurcation aneurysm seen on angiogram is not identified. Likely no substantial change in size. MRA NECK Common, internal, and  external carotid arteries are patent. There is no measurable stenosis at the ICA origins. Extracranial vertebral arteries are patent. Right vertebral artery is dominant. There is atherosclerotic irregularity of the proximal left vertebral artery with areas of mild and moderate stenosis. There is moderate stenosis of the proximal right V1 vertebral artery. IMPRESSION: No acute abnormality.  Chronic left MCA infarction.  Three of four aneurysms seen on prior catheter angiogram are again identified and are likely without substantial change. Extracranial vertebral artery atherosclerosis, left greater than right. Electronically Signed   By: Macy Mis M.D.   On: 05/12/2020 09:06   MR BRAIN WO CONTRAST  Result Date: 05/12/2020 CLINICAL DATA:  Altered mental status, headache, history of aneurysm EXAM: MRI HEAD WITHOUT CONTRAST MRA HEAD WITHOUT CONTRAST MRA NECK WITHOUT AND WITH CONTRAST TECHNIQUE: Multiplanar, multiecho pulse sequences of the brain and surrounding structures were obtained without intravenous contrast. Angiographic images of the Circle of Willis were obtained using MRA technique without intravenous contrast. Angiographic images of the neck were obtained using MRA technique without and with intravenous contrast. Carotid stenosis measurements (when applicable) are obtained utilizing NASCET criteria, using the distal internal carotid diameter as the denominator. CONTRAST:  79mL GADAVIST GADOBUTROL 1 MMOL/ML IV SOLN COMPARISON:  MRI 12/27/2019, correlation made with CT head 05/11/2020, prior angiogram FINDINGS: MRI HEAD Brain: There is no acute infarction. Extensive encephalomalacia and chronic blood products are present in the anterior/inferior left MCA territory involving frontal lobe, insula, temporal lobe, and basal ganglia with some parietal extension. Additional small chronic cortical infarct in the right posterior temporal region with chronic blood products. There is ex vacuo dilatation of the left lateral ventricle. There is no intracranial mass, mass effect, or edema. There is no hydrocephalus or extra-axial fluid collection. Vascular: Better evaluated on MRA portion. Skull and upper cervical spine: Normal marrow signal is preserved. Sinuses/Orbits: Paranasal sinuses are aerated. Orbits are unremarkable. Other: Sella is unremarkable.  Mastoid air cells are clear. MRA HEAD Intracranial internal carotid arteries  are patent. Middle and anterior cerebral arteries are patent. Duplicated anterior communicating artery is noted. Intracranial vertebral arteries, basilar artery, posterior cerebral arteries are patent. Intracranial right vertebral artery is patent. Intracranial left vertebral artery demonstrates diminished flow related enhancement proximally with preserved enhancement on post-contrast neck MRA. Basilar artery and posterior cerebral arteries are patent. A left posterior communicating artery is identified. Aneurysms again identified arising from the right paraclinoid ICA, communicating segment of the left ICA, and left A1-A2 junction. The left MCA bifurcation aneurysm seen on angiogram is not identified. Likely no substantial change in size. MRA NECK Common, internal, and external carotid arteries are patent. There is no measurable stenosis at the ICA origins. Extracranial vertebral arteries are patent. Right vertebral artery is dominant. There is atherosclerotic irregularity of the proximal left vertebral artery with areas of mild and moderate stenosis. There is moderate stenosis of the proximal right V1 vertebral artery. IMPRESSION: No acute abnormality.  Chronic left MCA infarction. Three of four aneurysms seen on prior catheter angiogram are again identified and are likely without substantial change. Extracranial vertebral artery atherosclerosis, left greater than right. Electronically Signed   By: Macy Mis M.D.   On: 05/12/2020 09:06   US RENAL  Result Date: 05/12/2020 CLINICAL DATA:  Acute kidney injury. EXAM: RENAL / URINARY TRACT ULTRASOUND COMPLETE COMPARISON:  Abdominal ultrasound 09/24/2018.  CT 10/02/2018. FINDINGS: Right Kidney: Renal measurements: 12.7 x 6.1 x 6.4 cm = volume: 259.9 mL. Echogenicity within normal limits. No mass or hydronephrosis visualized.  Left Kidney: Renal measurements: 13.2 x 6.2 x 5.4 cm = volume: 233.3 mL. Echogenicity within normal limits. No mass or hydronephrosis  visualized. Bladder: Mildly distended (volume 546 cc).  No focal abnormality identified. Other: None. IMPRESSION: Normal renal ultrasound. No hydronephrosis. Electronically Signed   By: Richardean Sale M.D.   On: 05/12/2020 13:58   DG Chest Portable 1 View  Result Date: 05/11/2020 CLINICAL DATA:  Change in mental status EXAM: PORTABLE CHEST 1 VIEW COMPARISON:  Dec 27, 2019 FINDINGS: The heart size and mediastinal contours are within normal limits. Both lungs are clear. The visualized skeletal structures are unremarkable. IMPRESSION: No active disease. Electronically Signed   By: Prudencio Pair M.D.   On: 05/11/2020 20:41   ECHOCARDIOGRAM COMPLETE  Result Date: 04/26/2020    ECHOCARDIOGRAM REPORT   Patient Name:   Jalecia A Pastrana Date of Exam: 04/26/2020 Medical Rec #:  [id number]      Height:       66.0 in Accession #:    [id number]     Weight:       187.2 lb Date of Birth:  [birth year]     BSA:          1.944 m Patient Age:    37 years       BP:           92/57 mmHg Patient Gender: F              HR:           69 bpm. Exam Location:  [name] Procedure: 2D Echo, Cardiac Doppler and Color Doppler Indications:    I50.42 (ICD-10-CM) - Chronic combined systolic and diastolic CHF                 (congestive heart failure)  History:        Patient has prior history of Echocardiogram examinations, most                 recent 12/27/2019. CHF, COPD and Stroke, Arrythmias:Atrial                 Fibrillation; Risk Factors:Hypertension and Diabetes. Nicotine                 abuse,Substance abuse, Embolic cerebral infarction [facility]) s/p                 clot retrieval.  Sonographer:    Alvino Chapel RCS Referring Phys: [id number] [place]  1. Left ventricular ejection fraction, by estimation, is 65 to 70%, significantly improved compared to the prior study in May 2021. The left ventricle has normal function. The left ventricle has no regional wall motion abnormalities. There is moderate left ventricular  hypertrophy. Left ventricular diastolic parameters are indeterminate.  2. Right ventricular systolic function is normal. The right ventricular size is normal. Tricuspid regurgitation signal is inadequate for assessing PA pressure.  3. Left atrial size was moderately dilated.  4. Moderate pericardial effusion. The pericardial effusion is posterior to the left ventricle.  5. The mitral valve is grossly normal. Trivial mitral valve regurgitation.  6. The aortic valve is possibly bicuspid. There is mild calcification of the aortic valve. Aortic valve regurgitation is not visualized. Mild to moderate aortic valve stenosis. Aortic valve mean gradient measures 11.0 mmHg. Aortic valve Vmax measures 2.40 m/s. Dimentionless index 0.46.  7. The inferior vena cava is normal in size with <50% respiratory variability, suggesting right atrial pressure of 8 mmHg. FINDINGS  Left Ventricle: Left ventricular ejection fraction, by estimation, is 65 to 70%. The left ventricle has normal function. The left ventricle has no regional wall motion abnormalities. The left ventricular internal cavity size was normal in size. There is  moderate left ventricular hypertrophy. Left ventricular diastolic parameters are indeterminate. Right Ventricle: The right ventricular size is normal. No increase in right ventricular wall thickness. Right ventricular systolic function is normal. Tricuspid regurgitation signal is inadequate for assessing PA pressure. Left Atrium: Left atrial size was moderately dilated. Right Atrium: Right atrial size was normal in size. Pericardium: A moderately sized pericardial effusion is present. The pericardial effusion is posterior to the left ventricle. Presence of pericardial fat pad. Mitral Valve: The mitral valve is grossly normal. Trivial mitral valve regurgitation. Tricuspid Valve: The tricuspid valve is grossly normal. Tricuspid valve regurgitation is mild. Aortic Valve: The aortic valve is bicuspid. There is mild  calcification of the aortic valve. Aortic valve regurgitation is not visualized. Mild to moderate aortic stenosis is present. Aortic valve mean gradient measures 11.0 mmHg. Aortic valve peak gradient measures 22.9 mmHg. Aortic valve area, by VTI measures 1.31 cm. Pulmonic Valve: The pulmonic valve was grossly normal. Pulmonic valve regurgitation is trivial. Aorta: The aortic root is normal in size and structure. Venous: The inferior vena cava is normal in size with less than 50% respiratory variability, suggesting right atrial pressure of 8 mmHg. IAS/Shunts: No atrial level shunt detected by color flow Doppler.  LEFT VENTRICLE PLAX 2D LVIDd:         3.56 cm  Diastology LVIDs:         2.17 cm  LV e' medial:   7.07 cm/s LV PW:         1.25 cm  LV E/e' medial: 15.9 LV IVS:        1.51 cm LVOT diam:     1.90 cm LV SV:         53 LV SV Index:   27 LVOT Area:     2.84 cm  RIGHT VENTRICLE RV S prime:     9.90 cm/s TAPSE (M-mode): 1.7 cm LEFT ATRIUM              Index       RIGHT ATRIUM           Index LA diam:        4.10 cm  2.11 cm/m  RA Area:     18.20 cm LA Vol (A2C):   118.0 ml 60.69 ml/m RA Volume:   46.10 ml  23.71 ml/m LA Vol (A4C):   93.5 ml  48.09 ml/m LA Biplane Vol: 109.0 ml 56.06 ml/m  AORTIC VALVE AV Area (Vmax):    1.06 cm AV Area (Vmean):   1.24 cm AV Area (VTI):     1.31 cm AV Vmax:           239.50 cm/s AV Vmean:          147.000 cm/s AV VTI:            0.402 m AV Peak Grad:      22.9 mmHg AV Mean Grad:      11.0 mmHg LVOT Vmax:         89.40 cm/s LVOT Vmean:        64.100 cm/s LVOT VTI:          0.186 m LVOT/AV VTI ratio: 0.46  AORTA Ao Root diam: 3.30 cm MITRAL VALVE MV Area (PHT): 2.38 cm  SHUNTS MV Decel Time: 319 msec     Systemic VTI:  0.19 m MV E velocity: 112.50 cm/s  Systemic Diam: 1.90 cm Rozann Lesches MD Electronically signed by Rozann Lesches MD Signature Date/Time: 04/26/2020/3:51:47 PM    Final    ECHOCARDIOGRAM LIMITED  Result Date: 05/12/2020    ECHOCARDIOGRAM LIMITED  REPORT   Patient Name:   [facility] A L[facility]ara Date of Exam: 05/12/2020 Medical Rec #:  [id number]      Height:       67.0 in Accession #:    [id number]     Weight:       200.0 lb Date of Birth:  11-Sep-1965     BSA:          2.022 m Patient Age:    62 years       BP:           111/90 mmHg Patient Gender: F              HR:           66 bpm. Exam Location:  [name] Procedure: Limited Echo, Cardiac Doppler and Limited Color Doppler Indications:    Stroke 434.91/l63.9  History:        Patient has prior history of Echocardiogram examinations, most                 recent 04/26/2020. CHF, COPD and Stroke, Arrythmias:Atrial                 Fibrillation; Risk Factors:Current Smoker, Hypertension and                 Diabetes. Cocaine abuse,Embolic cerebral infarction [facility])                 s/p clot retrieval.  Sonographer:    Alvino Chapel RCS Referring Phys: [id number] OLADAPO ADEFESO IMPRESSIONS  1. Left ventricular ejection fraction, by estimation, is 70 to 75%. The left ventricle has hyperdynamic function. The left ventricle has no regional wall motion abnormalities. There is moderate left ventricular hypertrophy. Left ventricular diastolic parameters are indeterminate.  2. Right ventricular systolic function is normal. The right ventricular size is normal. Tricuspid regurgitation signal is inadequate for assessing PA pressure.  3. A small pericardial effusion is present. The pericardial effusion is circumferential.  4. The mitral valve is abnormal. There is mild prolapse of the anterior leaflet. Mild mitral valve regurgitation.  5. The aortic valve is tricuspid. Aortic valve regurgitation is not visualized.  6. The inferior vena cava is normal in size with greater than 50% respiratory variability, suggesting right atrial pressure of 3 mmHg. FINDINGS  Left Ventricle: Left ventricular ejection fraction, by estimation, is 70 to 75%. The left ventricle has hyperdynamic function. The left ventricle has no regional wall motion  abnormalities. The left ventricular internal cavity size was normal in size. There is moderate left ventricular hypertrophy. Left ventricular diastolic parameters are indeterminate. Right Ventricle: The right ventricular size is normal. No increase in right ventricular wall thickness. Right ventricular systolic function is normal. Tricuspid regurgitation signal is inadequate for assessing PA pressure. Pericardium: A small pericardial effusion is present. The pericardial effusion is circumferential. Presence of pericardial fat pad. Mitral Valve: The mitral valve is abnormal. There is mild thickening of the mitral valve leaflet(s). Mild mitral annular calcification. Mild mitral valve regurgitation. Aortic Valve: The aortic valve is tricuspid. Aortic valve regurgitation is not visualized. Aorta: The aortic root is normal in size and structure. Venous: The  inferior vena cava is normal in size with greater than 50% respiratory variability, suggesting right atrial pressure of 3 mmHg. IAS/Shunts: No atrial level shunt detected by color flow Doppler. LEFT VENTRICLE PLAX 2D LVIDd:         3.98 cm LVIDs:         2.39 cm LV PW:         1.26 cm LV IVS:        1.59 cm LVOT diam:     1.70 cm LVOT Area:     2.27 cm  LEFT ATRIUM         Index LA diam:    4.60 cm 2.27 cm/m   AORTA Ao Root diam: 3.10 cm  SHUNTS Systemic Diam: 1.70 cm Rozann Lesches MD Electronically signed by Rozann Lesches MD Signature Date/Time: 05/12/2020/11:06:36 AM    Final      Discharge Exam: Vitals:   05/15/20 2149 05/16/20 0520  BP: (!) 141/70 (!) 134/92  Pulse: (!) 54 68  Resp: 20 20  Temp: 98.3 F (36.8 C) 98 F (36.7 C)  SpO2: 100% 100%   Vitals:   05/15/20 0552 05/15/20 1534 05/15/20 2149 05/16/20 0520  BP: (!) 154/70 (!) 144/78 (!) 141/70 (!) 134/92  Pulse: (!) 58 (!) 58 (!) 54 68  Resp: 20 20 20 20   Temp: 98.2 F (36.8 C) 98.4 F (36.9 C) 98.3 F (36.8 C) 98 F (36.7 C)  TempSrc: Oral  Oral Oral  SpO2: 100% 100% 100% 100%   Weight:      Height:        General: Pt is alert, awake, not in acute distress Cardiovascular: RRR, S1/S2 +, no rubs, no gallops Respiratory: CTA bilaterally, no wheezing, no rhonchi Abdominal: Soft, NT, ND, bowel sounds + Extremities: no edema, no cyanosis    The results of significant diagnostics from this hospitalization (including imaging, microbiology, ancillary and laboratory) are listed below for reference.     Microbiology: Recent Results (from the past 240 hour(s))  Urine culture     Status: Abnormal   Collection Time: 05/11/20 11:44 PM   Specimen: Urine, Clean Catch  Result Value Ref Range Status   Specimen Description   Final    URINE, CLEAN CATCH Performed at [facility], [address], [place], [place] [address]    Special Requests   Final    NONE Performed at [facility], [address], [place], [place] [address]    Culture (A)  Final    <10,000 COLONIES/mL INSIGNIFICANT GROWTH Performed at [facility] [address], [place], [place] [address]    Report Status 05/13/2020 FINAL  Final  Respiratory Panel by RT PCR (Flu A&B, Covid) - Nasopharyngeal Swab     Status: None   Collection Time: 05/11/20 11:59 PM   Specimen: Nasopharyngeal Swab  Result Value Ref Range Status   SARS Coronavirus 2 by RT PCR NEGATIVE NEGATIVE Final    Comment: (NOTE) SARS-CoV-2 target nucleic acids are NOT DETECTED.  The SARS-CoV-2 RNA is generally detectable in upper respiratoy specimens during the acute phase of infection. The lowest concentration of SARS-CoV-2 viral copies this assay can detect is 131 copies/mL. A negative result does not preclude SARS-Cov-2 infection and should not be used as the sole basis for treatment or other patient management decisions. A negative result may occur with  improper specimen collection/handling, submission of specimen other than nasopharyngeal swab, presence of viral mutation(s) within the areas targeted by this assay, and  inadequate  number of viral copies (<131 copies/mL). A negative result must be combined with clinical observations, patient history, and epidemiological information. The expected result is Negative.  Fact Sheet for Patients:  PinkCheek.be  Fact Sheet for Healthcare Providers:  GravelBags.it  This test is no t yet approved or cleared by the [place] FDA and  has been authorized for detection and/or diagnosis of SARS-CoV-2 by FDA under an Emergency Use Authorization (EUA). This EUA will remain  in effect (meaning this test can be used) for the duration of the COVID-19 declaration under Section 564(b)(1) of the Act, 21 U.S.C. section 360bbb-3(b)(1), unless the authorization is terminated or revoked sooner.     Influenza A by PCR NEGATIVE NEGATIVE Final   Influenza B by PCR NEGATIVE NEGATIVE Final    Comment: (NOTE) The Xpert Xpress SARS-CoV-2/FLU/RSV assay is intended as an aid in  the diagnosis of influenza from Nasopharyngeal swab specimens and  should not be used as a sole basis for treatment. Nasal washings and  aspirates are unacceptable for Xpert Xpress SARS-CoV-2/FLU/RSV  testing.  Fact Sheet for Patients: PinkCheek.be  Fact Sheet for Healthcare Providers: GravelBags.it  This test is not yet approved or cleared by the [place] FDA and  has been authorized for detection and/or diagnosis of SARS-CoV-2 by  FDA under an Emergency Use Authorization (EUA). This EUA will remain  in effect (meaning this test can be used) for the duration of the  Covid-19 declaration under Section 564(b)(1) of the Act, 21  U.S.C. section 360bbb-3(b)(1), unless the authorization is  terminated or revoked. Performed at [facility], [address] [place], [place] [address]   Respiratory Panel by RT PCR (Flu A&B, Covid) - Nasopharyngeal Swab     Status: None   Collection  Time: 05/16/20  9:29 AM   Specimen: Nasopharyngeal Swab  Result Value Ref Range Status   SARS Coronavirus 2 by RT PCR NEGATIVE NEGATIVE Final    Comment: (NOTE) SARS-CoV-2 target nucleic acids are NOT DETECTED.  The SARS-CoV-2 RNA is generally detectable in upper respiratoy specimens during the acute phase of infection. The lowest concentration of SARS-CoV-2 viral copies this assay can detect is 131 copies/mL. A negative result does not preclude SARS-Cov-2 infection and should not be used as the sole basis for treatment or other patient management decisions. A negative result may occur with  improper specimen collection/handling, submission of specimen other than nasopharyngeal swab, presence of viral mutation(s) within the areas targeted by this assay, and inadequate number of viral copies (<131 copies/mL). A negative result must be combined with clinical observations, patient history, and epidemiological information. The expected result is Negative.  Fact Sheet for Patients:  PinkCheek.be  Fact Sheet for Healthcare Providers:  GravelBags.it  This test is no t yet approved or cleared by the [place] FDA and  has been authorized for detection and/or diagnosis of SARS-CoV-2 by FDA under an Emergency Use Authorization (EUA). This EUA will remain  in effect (meaning this test can be used) for the duration of the COVID-19 declaration under Section 564(b)(1) of the Act, 21 U.S.C. section 360bbb-3(b)(1), unless the authorization is terminated or revoked sooner.     Influenza A by PCR NEGATIVE NEGATIVE Final   Influenza B by PCR NEGATIVE NEGATIVE Final    Comment: (NOTE) The Xpert Xpress SARS-CoV-2/FLU/RSV assay is intended as an aid in  the diagnosis of influenza from Nasopharyngeal swab specimens and  should not be used as a sole basis for treatment. Nasal washings and  aspirates are unacceptable for Xpert Xpress  SARS-CoV-2/FLU/RSV  testing.  Fact Sheet for Patients: PinkCheek.be  Fact Sheet for Healthcare Providers: GravelBags.it  This test is not yet approved or cleared by the [place] FDA and  has been authorized for detection and/or diagnosis of SARS-CoV-2 by  FDA under an Emergency Use Authorization (EUA). This EUA will remain  in effect (meaning this test can be used) for the duration of the  Covid-19 declaration under Section 564(b)(1) of the Act, 21  U.S.C. section 360bbb-3(b)(1), unless the authorization is  terminated or revoked. Performed at [facility], [address], [place], [place] [address]      Labs: BNP (last 3 results) Recent Labs    10/31/19 0635 12/26/19 2255  BNP 237.0* 341.9*   Basic Metabolic Panel: Recent Labs  Lab 05/12/20 0313 05/12/20 0313 05/12/20 0913 05/13/20 0624 05/13/20 0700 05/14/20 0643 05/15/20 0618 05/16/20 1002  NA 135   < > 136 136  --  135 137 136  K 3.2*   < > 3.2* 3.8  --  3.1* 3.7 3.8  CL 103   < > 103 105  --  102 106 104  CO2 20*   < > 22 20*  --  24 23 22   GLUCOSE 114*   < > 113* 120*  --  137* 124* 206*  BUN 19   < > 18 14  --  11 9 9   CREATININE 2.38*   < > 2.39* 1.99*  --  1.61* 1.41* 1.45*  CALCIUM 7.7*   < > 8.0* 8.4*  --  8.5* 8.4* 8.7*  MG 1.2*  --   --   --  2.1 1.7 1.7 1.7  PHOS 2.2*  --   --   --   --   --   --   --    < > = values in this interval not displayed.   Liver Function Tests: Recent Labs  Lab 05/12/20 0313  AST 9*  ALT 6  ALKPHOS 50  BILITOT 0.7  PROT 6.3*  ALBUMIN 3.0*   No results for input(s): LIPASE, AMYLASE in the last 168 hours. No results for input(s): AMMONIA in the last 168 hours. CBC: Recent Labs  Lab 05/11/20 2016 05/12/20 0313 05/13/20 0624 05/14/20 0643  WBC 10.8* 8.8 8.4 8.8  HGB 11.6* 10.0* 10.5* 10.4*  HCT 34.8* 29.5* 31.7* 31.4*  MCV 84.3 84.3 84.5 84.4  PLT 333 250 265 274   Cardiac Enzymes: No results  for input(s): CKTOTAL, CKMB, CKMBINDEX, TROPONINI in the last 168 hours. BNP: Invalid input(s): POCBNP CBG: Recent Labs  Lab 05/13/20 1115 05/13/20 1618 05/14/20 0727 05/14/20 1122 05/14/20 1625  GLUCAP 132* 113* 141* 150* 126*   D-Dimer No results for input(s): DDIMER in the last 72 hours. Hgb A1c No results for input(s): HGBA1C in the last 72 hours. Lipid Profile No results for input(s): CHOL, HDL, LDLCALC, TRIG, CHOLHDL, LDLDIRECT in the last 72 hours. Thyroid function studies No results for input(s): TSH, T4TOTAL, T3FREE, THYROIDAB in the last 72 hours.  Invalid input(s): FREET3 Anemia work up No results for input(s): VITAMINB12, FOLATE, FERRITIN, TIBC, IRON, RETICCTPCT in the last 72 hours. Urinalysis    Component Value Date/Time   COLORURINE YELLOW 05/11/2020 2255   APPEARANCEUR CLOUDY (A) 05/11/2020 2255   LABSPEC 1.016 05/11/2020 2255   PHURINE 5.0 05/11/2020 2255   GLUCOSEU NEGATIVE 05/11/2020 2255   GLUCOSEU >=1000 (A) 09/21/2018 0829   HGBUR NEGATIVE 05/11/2020 2255   BILIRUBINUR  NEGATIVE 05/11/2020 [place] 05/11/2020 2255   PROTEINUR 30 (A) 05/11/2020 2255   UROBILINOGEN 0.2 09/21/2018 0829   NITRITE NEGATIVE 05/11/2020 2255   LEUKOCYTESUR LARGE (A) 05/11/2020 2255   Sepsis Labs Invalid input(s): PROCALCITONIN,  WBC,  LACTICIDVEN Microbiology Recent Results (from the past 240 hour(s))  Urine culture     Status: Abnormal   Collection Time: 05/11/20 11:44 PM   Specimen: Urine, Clean Catch  Result Value Ref Range Status   Specimen Description   Final    URINE, CLEAN CATCH Performed at [facility], [address] [place], [place] [address]    Special Requests   Final    NONE Performed at [facility], [address], [place], [place] [address]    Culture (A)  Final    <10,000 COLONIES/mL INSIGNIFICANT GROWTH Performed at [facility], [place] [address], [place],  [address]    Report Status 05/13/2020 FINAL  Final   Respiratory Panel by RT PCR (Flu A&B, Covid) - Nasopharyngeal Swab     Status: None   Collection Time: 05/11/20 11:59 PM   Specimen: Nasopharyngeal Swab  Result Value Ref Range Status   SARS Coronavirus 2 by RT PCR NEGATIVE NEGATIVE Final    Comment: (NOTE) SARS-CoV-2 target nucleic acids are NOT DETECTED.  The SARS-CoV-2 RNA is generally detectable in upper respiratoy specimens during the acute phase of infection. The lowest concentration of SARS-CoV-2 viral copies this assay can detect is 131 copies/mL. A negative result does not preclude SARS-Cov-2 infection and should not be used as the sole basis for treatment or other patient management decisions. A negative result may occur with  improper specimen collection/handling, submission of specimen other than nasopharyngeal swab, presence of viral mutation(s) within the areas targeted by this assay, and inadequate number of viral copies (<131 copies/mL). A negative result must be combined with clinical observations, patient history, and epidemiological information. The expected result is Negative.  Fact Sheet for Patients:  PinkCheek.be  Fact Sheet for Healthcare Providers:  GravelBags.it  This test is no t yet approved or cleared by the [place] FDA and  has been authorized for detection and/or diagnosis of SARS-CoV-2 by FDA under an Emergency Use Authorization (EUA). This EUA will remain  in effect (meaning this test can be used) for the duration of the COVID-19 declaration under Section 564(b)(1) of the Act, 21 U.S.C. section 360bbb-3(b)(1), unless the authorization is terminated or revoked sooner.     Influenza A by PCR NEGATIVE NEGATIVE Final   Influenza B by PCR NEGATIVE NEGATIVE Final    Comment: (NOTE) The Xpert Xpress SARS-CoV-2/FLU/RSV assay is intended as an aid in  the diagnosis of influenza from Nasopharyngeal swab specimens and  should not be used as  a sole basis for treatment. Nasal washings and  aspirates are unacceptable for Xpert Xpress SARS-CoV-2/FLU/RSV  testing.  Fact Sheet for Patients: PinkCheek.be  Fact Sheet for Healthcare Providers: GravelBags.it  This test is not yet approved or cleared by the [place] FDA and  has been authorized for detection and/or diagnosis of SARS-CoV-2 by  FDA under an Emergency Use Authorization (EUA). This EUA will remain  in effect (meaning this test can be used) for the duration of the  Covid-19 declaration under Section 564(b)(1) of the Act, 21  U.S.C. section 360bbb-3(b)(1), unless the authorization is  terminated or revoked. Performed at [facility], [address], [place],  [address]   Respiratory Panel by RT PCR (Flu A&B,  Covid) - Nasopharyngeal Swab     Status: None   Collection Time: 05/16/20  9:29 AM   Specimen: Nasopharyngeal Swab  Result Value Ref Range Status   SARS Coronavirus 2 by RT PCR NEGATIVE NEGATIVE Final    Comment: (NOTE) SARS-CoV-2 target nucleic acids are NOT DETECTED.  The SARS-CoV-2 RNA is generally detectable in upper respiratoy specimens during the acute phase of infection. The lowest concentration of SARS-CoV-2 viral copies this assay can detect is 131 copies/mL. A negative result does not preclude SARS-Cov-2 infection and should not be used as the sole basis for treatment or other patient management decisions. A negative result may occur with  improper specimen collection/handling, submission of specimen other than nasopharyngeal swab, presence of viral mutation(s) within the areas targeted by this assay, and inadequate number of viral copies (<131 copies/mL). A negative result must be combined with clinical observations, patient history, and epidemiological information. The expected result is Negative.  Fact Sheet for Patients:  PinkCheek.be  Fact Sheet  for Healthcare Providers:  GravelBags.it  This test is no t yet approved or cleared by the [place] FDA and  has been authorized for detection and/or diagnosis of SARS-CoV-2 by FDA under an Emergency Use Authorization (EUA). This EUA will remain  in effect (meaning this test can be used) for the duration of the COVID-19 declaration under Section 564(b)(1) of the Act, 21 U.S.C. section 360bbb-3(b)(1), unless the authorization is terminated or revoked sooner.     Influenza A by PCR NEGATIVE NEGATIVE Final   Influenza B by PCR NEGATIVE NEGATIVE Final    Comment: (NOTE) The Xpert Xpress SARS-CoV-2/FLU/RSV assay is intended as an aid in  the diagnosis of influenza from Nasopharyngeal swab specimens and  should not be used as a sole basis for treatment. Nasal washings and  aspirates are unacceptable for Xpert Xpress SARS-CoV-2/FLU/RSV  testing.  Fact Sheet for Patients: PinkCheek.be  Fact Sheet for Healthcare Providers: GravelBags.it  This test is not yet approved or cleared by the [place] FDA and  has been authorized for detection and/or diagnosis of SARS-CoV-2 by  FDA under an Emergency Use Authorization (EUA). This EUA will remain  in effect (meaning this test can be used) for the duration of the  Covid-19 declaration under Section 564(b)(1) of the Act, 21  U.S.C. section 360bbb-3(b)(1), unless the authorization is  terminated or revoked. Performed at [facility], [address] [place], [place] [address]      Time coordinating discharge: 35 minutes  SIGNED:   Rodena Goldmann, DO [facility] 05/16/2020, 11:22 AM  If 7PM-7AM, please contact night-coverage [email or web address]

## 2020-05-16 NOTE — TOC Progression Note (Signed)
Transition of Care [facility]) - Progression Note    Patient Details  Name: Robin [name] MRN: [id number] Date of Birth: 03-Aug-1966  [facility]) CM/SW Contact  Shade Flood, LCSW Phone Number: 05/16/2020, 2:16 PM  Clinical Narrative:     TOC following. [facility] offered a bed to pt and she and family have accepted. [facility] working on getting authorization and then pt can transfer. Pt and her sister aware.  TOC will follow.  Expected Discharge Plan: [place] Barriers to Discharge: Continued Medical Work up, Ship broker  Expected Discharge Plan and Services Expected Discharge Plan: [place] In-house Referral: Clinical Social Work   Post Acute Care Choice: [place] Living arrangements for the past 2 months: [place] Expected Discharge Date: 05/16/20                                     Social Determinants of Health (SDOH) Interventions    Readmission Risk Interventions Readmission Risk Prevention Plan 05/12/2020  Transportation Screening Complete  Medication Review Press photographer) Complete  SW Recovery Care/Counseling Consult Complete  Palliative Care Screening Not Applicable  Skilled Nursing Facility Complete  Some recent data might be hidden

## 2020-05-17 ENCOUNTER — Other Ambulatory Visit (HOSPITAL_COMMUNITY): Payer: 59

## 2020-05-17 ENCOUNTER — Inpatient Hospital Stay (HOSPITAL_COMMUNITY): Admission: RE | Admit: 2020-05-17 | Payer: 59 | Source: Ambulatory Visit

## 2020-05-17 ENCOUNTER — Ambulatory Visit (HOSPITAL_COMMUNITY): Admission: RE | Admit: 2020-05-17 | Payer: 59 | Source: Home / Self Care | Admitting: Interventional Radiology

## 2020-05-17 ENCOUNTER — Encounter (HOSPITAL_COMMUNITY): Admission: RE | Payer: Self-pay | Source: Home / Self Care

## 2020-05-17 ENCOUNTER — Observation Stay (HOSPITAL_COMMUNITY): Admission: RE | Admit: 2020-05-17 | Payer: 59 | Source: Ambulatory Visit

## 2020-05-17 ENCOUNTER — Encounter (HOSPITAL_COMMUNITY): Payer: Self-pay

## 2020-05-17 SURGERY — MRI WITH ANESTHESIA
Anesthesia: General

## 2020-05-17 NOTE — Progress Notes (Signed)
Patient seen and examined. Remains hemodynamically stable and ready for SNF discharge. All questions answered and discharge summary reviewed. No changes needed. Continue supportive care and current prevention. No epileptic waves appreciated on EEG on 05/16/20. Please refer to discharge summary written and updated on 05/17/20.  Barton Dubois MD [phone]

## 2020-05-18 NOTE — Progress Notes (Signed)
Patient seen and examined.  Hemodynamically stable and ready to go to skilled nursing facility.  Please refer to discharge summary which has been updated and with pertinent details review for safe medications and outpatient recommendations after discharge.  Patient will go to [facility] in a skilled nursing facility for further care and rehabilitation.  Barton Dubois MD [phone]

## 2020-05-18 NOTE — Progress Notes (Signed)
IV and tele removed. Report given to Tomasa Hosteller, LPN at [facility]. Patient to be transported via [facility]. Will continue to monitor.

## 2020-05-18 NOTE — TOC Transition Note (Signed)
Transition of Care [facility]) - CM/SW Discharge Note   Patient Details  Name: Loreli Debruler [place]land MRN: [id number] Date of Birth: January 22, 1966  Transition of Care [facility]) CM/SW Contact:  Shade Flood, LCSW Phone Number: 05/18/2020, 10:31 AM   Clinical Narrative:     Pt stable for dc. [facility] received insurance auth and will take pt today. EMS arranged. RN to call report. DC clinical sent electronically.   There are no other TOC needs for dc.  Final next level of care: Skilled Nursing Facility Barriers to Discharge: Barriers Resolved   Patient Goals and CMS Choice Patient states their goals for this hospitalization and ongoing recovery are:: get better CMS Medicare.gov Compare Post Acute Care list provided to:: Patient Choice offered to / list presented to : Patient  Discharge Placement              Patient [facility] bed at: [facility] Patient to be transferred to facility by: EMS Name of family member notified: [name] Patient and family notified of of transfer: 05/18/20  Discharge Plan and Services In-house Referral: Clinical Social Work   Post Acute Care Choice: [place]                               Social Determinants of Health ([place]) Interventions     Readmission Risk Interventions Readmission Risk Prevention Plan 05/12/2020  Transportation Screening Complete  Medication Review Press photographer) Complete  SW Recovery Care/Counseling Consult Complete  Palliative Care Screening Not Applicable  Skilled Nursing Facility Complete  Some recent data might be hidden

## 2020-05-19 ENCOUNTER — Ambulatory Visit: Payer: 59 | Admitting: Physician Assistant

## 2020-05-22 LAB — BASIC METABOLIC PANEL
BUN: 12 (ref 4–21)
CO2: 22 (ref 13–22)
Chloride: 107 (ref 99–108)
Creatinine: 1.7 — AB (ref 0.5–1.1)
Glucose: 230
Potassium: 3.8 (ref 3.4–5.3)
Sodium: 141 (ref 137–147)

## 2020-05-22 LAB — COMPREHENSIVE METABOLIC PANEL
Calcium: 8.6 — AB (ref 8.7–10.7)
GFR calc Af Amer: 39.12
GFR calc non Af Amer: 33.75

## 2020-05-22 LAB — CBC AND DIFFERENTIAL
HCT: 25 — AB (ref 36–46)
Hemoglobin: 8.5 — AB (ref 12.0–16.0)
Platelets: 275 (ref 150–399)
WBC: 6.1

## 2020-05-22 LAB — CBC: RBC: 2.93 — AB (ref 3.87–5.11)

## 2020-05-22 LAB — HEMOGLOBIN A1C: Hemoglobin A1C: 7.9

## 2020-05-23 ENCOUNTER — Emergency Department (HOSPITAL_COMMUNITY): Payer: 59

## 2020-05-23 ENCOUNTER — Emergency Department (HOSPITAL_COMMUNITY)
Admission: EM | Admit: 2020-05-23 | Discharge: 2020-05-23 | Disposition: A | Discharge status: Home / Self Care | Payer: 59 | Attending: Emergency Medicine | Admitting: Emergency Medicine

## 2020-05-23 ENCOUNTER — Encounter (HOSPITAL_COMMUNITY): Payer: Self-pay | Admitting: *Deleted

## 2020-05-23 ENCOUNTER — Other Ambulatory Visit: Payer: Self-pay

## 2020-05-23 DIAGNOSIS — Z7982 Long term (current) use of aspirin: Secondary | ICD-10-CM | POA: Insufficient documentation

## 2020-05-23 DIAGNOSIS — J449 Chronic obstructive pulmonary disease, unspecified: Secondary | ICD-10-CM | POA: Diagnosis not present

## 2020-05-23 DIAGNOSIS — E1140 Type 2 diabetes mellitus with diabetic neuropathy, unspecified: Secondary | ICD-10-CM | POA: Insufficient documentation

## 2020-05-23 DIAGNOSIS — Z79899 Other long term (current) drug therapy: Secondary | ICD-10-CM | POA: Diagnosis not present

## 2020-05-23 DIAGNOSIS — N183 Chronic kidney disease, stage 3 unspecified: Secondary | ICD-10-CM | POA: Diagnosis not present

## 2020-05-23 DIAGNOSIS — Z794 Long term (current) use of insulin: Secondary | ICD-10-CM | POA: Insufficient documentation

## 2020-05-23 DIAGNOSIS — I5023 Acute on chronic systolic (congestive) heart failure: Secondary | ICD-10-CM | POA: Insufficient documentation

## 2020-05-23 DIAGNOSIS — I130 Hypertensive heart and chronic kidney disease with heart failure and stage 1 through stage 4 chronic kidney disease, or unspecified chronic kidney disease: Secondary | ICD-10-CM | POA: Diagnosis not present

## 2020-05-23 DIAGNOSIS — E1122 Type 2 diabetes mellitus with diabetic chronic kidney disease: Secondary | ICD-10-CM | POA: Diagnosis not present

## 2020-05-23 DIAGNOSIS — R519 Headache, unspecified: Secondary | ICD-10-CM | POA: Insufficient documentation

## 2020-05-23 DIAGNOSIS — Z87891 Personal history of nicotine dependence: Secondary | ICD-10-CM | POA: Insufficient documentation

## 2020-05-23 DIAGNOSIS — I2510 Atherosclerotic heart disease of native coronary artery without angina pectoris: Secondary | ICD-10-CM | POA: Insufficient documentation

## 2020-05-23 HISTORY — DX: Chronic atrial fibrillation, unspecified: I48.20

## 2020-05-23 LAB — CBG MONITORING, ED: Glucose-Capillary: 286 mg/dL — ABNORMAL HIGH (ref 70–99)

## 2020-05-23 IMAGING — CT CT HEAD W/O CM
3 series · 16 of 47 positions shown, 19 images · non-contrast
Comparison: CT head [DATE], MRI [DATE].

CLINICAL DATA: Cerebral hemorrhage suspected. Severe headache with
dizziness.

EXAM:
CT HEAD WITHOUT CONTRAST
TECHNIQUE: Contiguous axial images were obtained from the base of the skull
through the vertex without intravenous contrast.

[Series 2: head w o · axial · 0.41mm/px · z∈[+127,+257]mm · 10 of 32 slices shown, 13 images]
[im 3/32  brain]
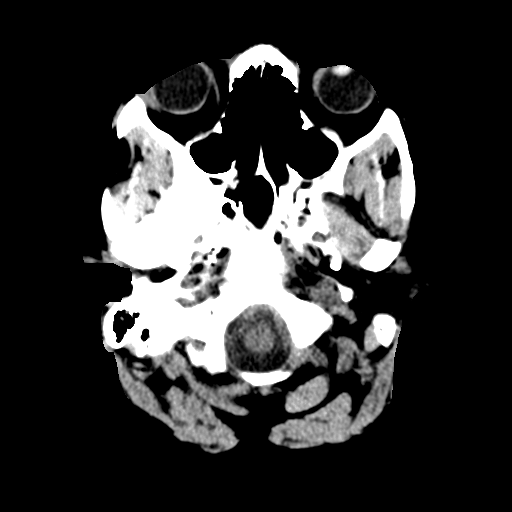
[im 3/32  bone]
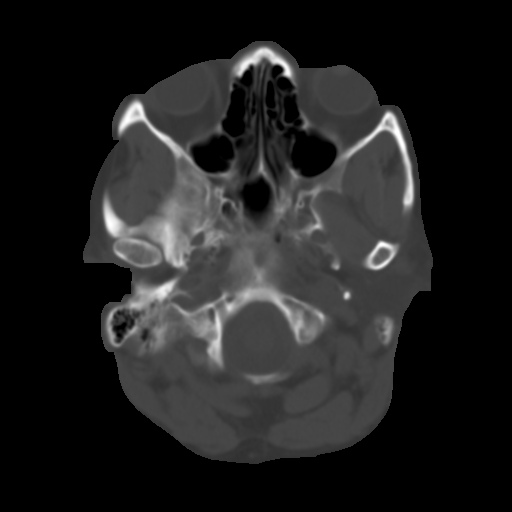
[im 6/32  brain]
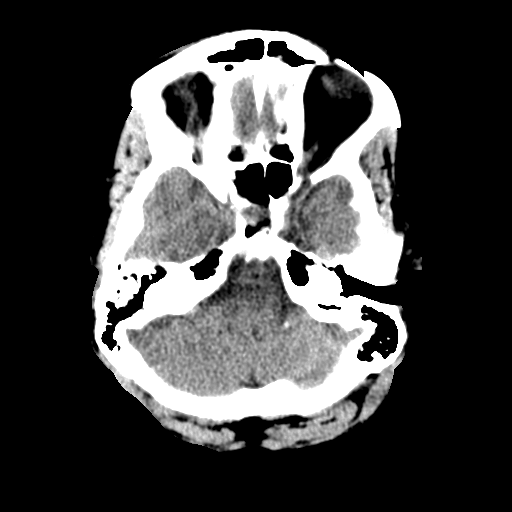
[im 9/32  brain]
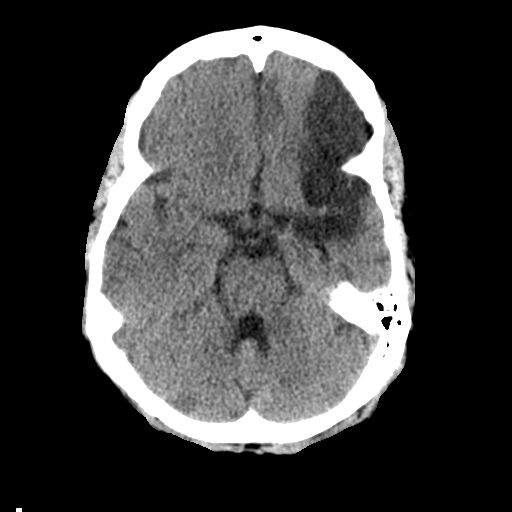
[im 11/32  brain]
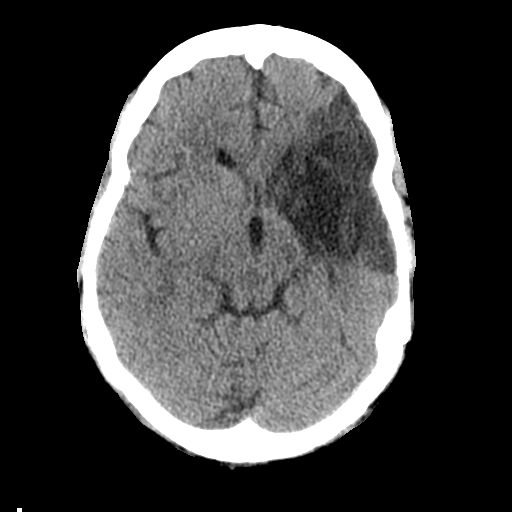
[im 14/32  brain]
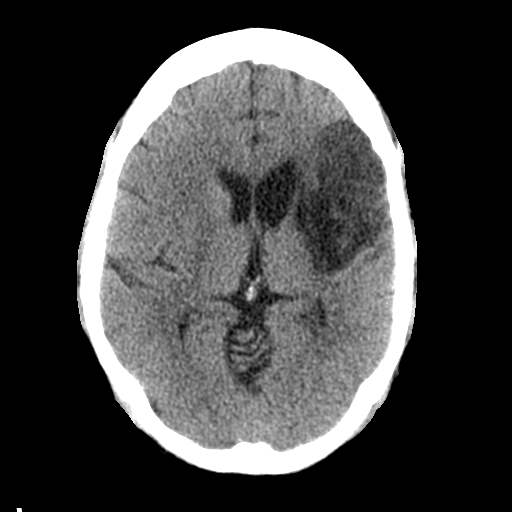
[im 14/32  bone]
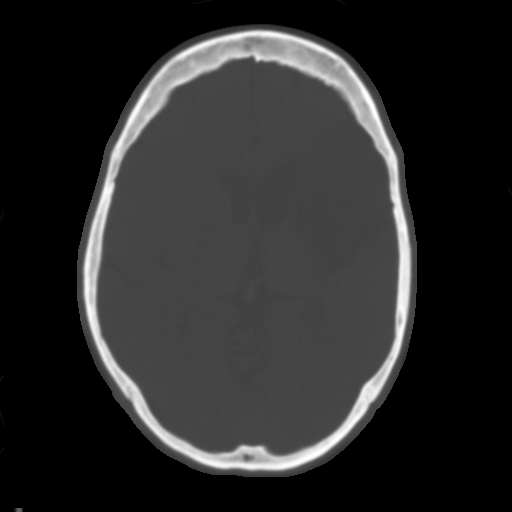
[im 18/32  brain]
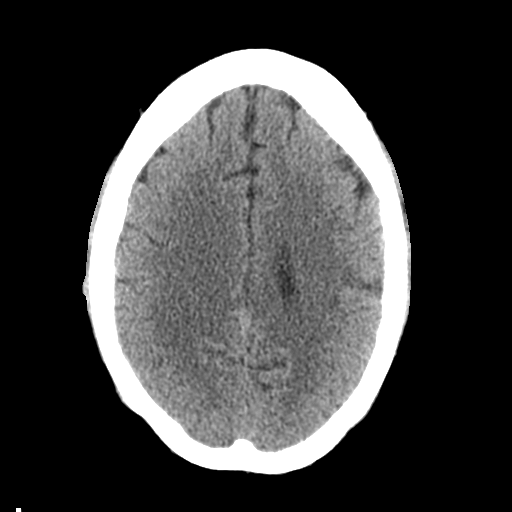
[im 21/32  brain]
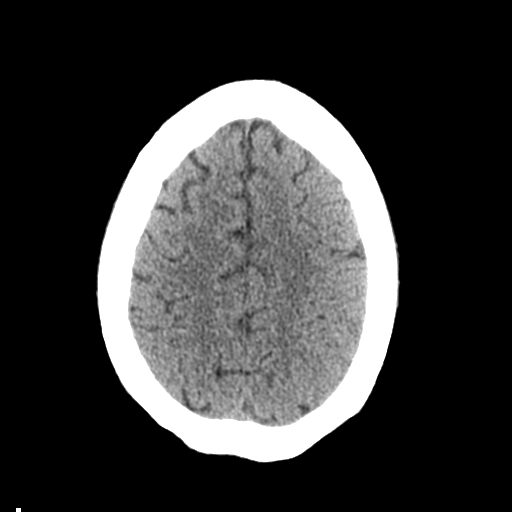
[im 24/32  brain]
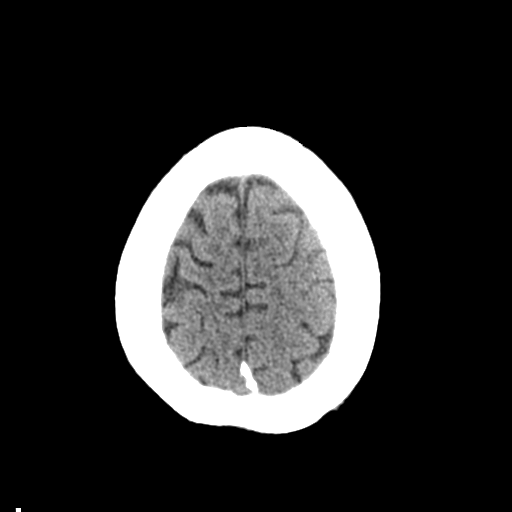
[im 26/32  brain]
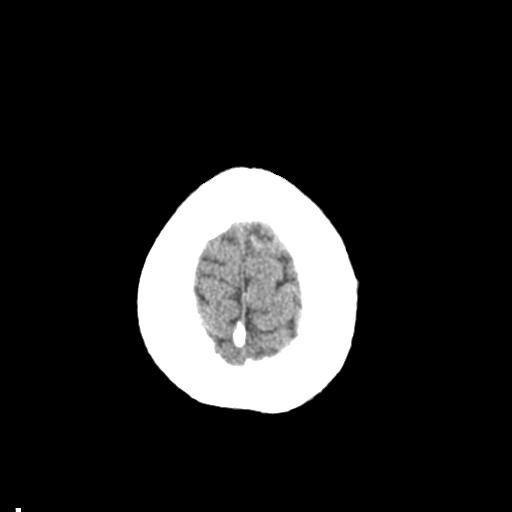
[im 26/32  bone]
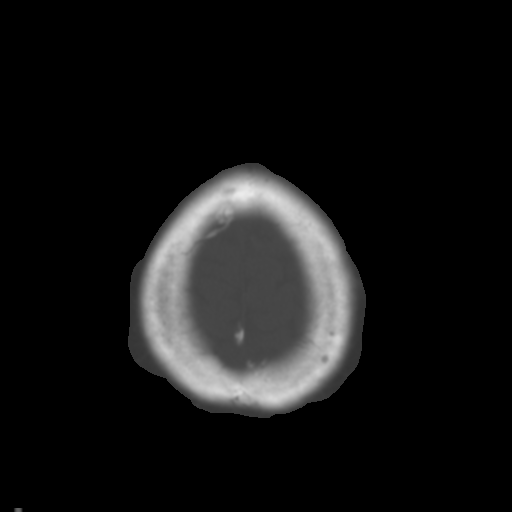
[im 29/32  brain]
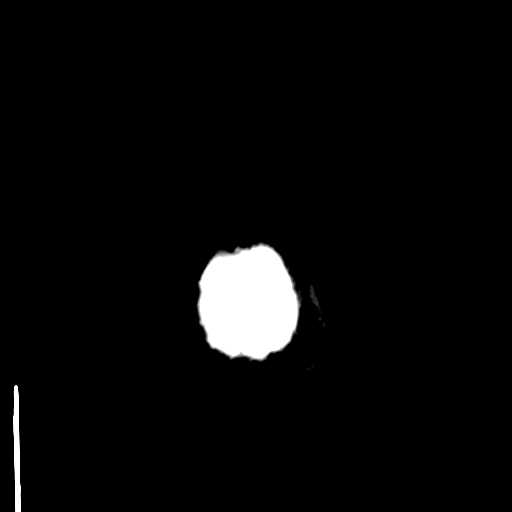

[Series 4: coronal soft · coronal · 0.31mm/px · 3 of 67 slices shown]
[im 23/67  brain]
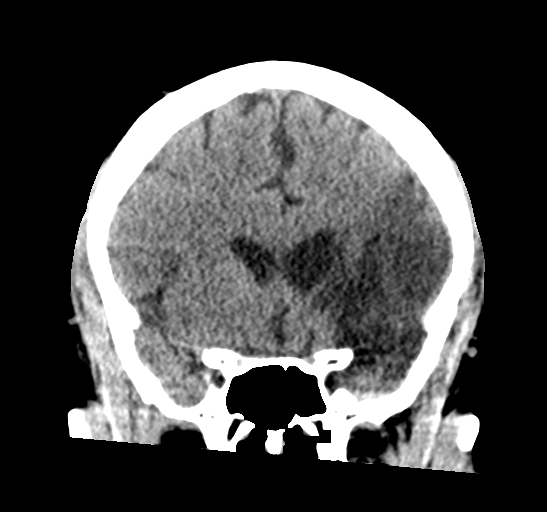
[im 30/67  brain]
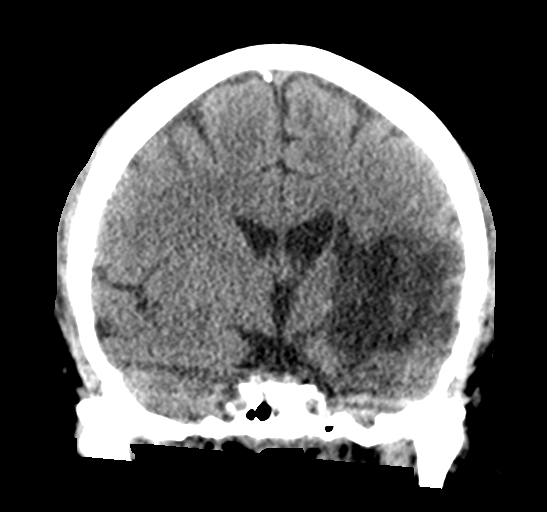
[im 37/67  brain]
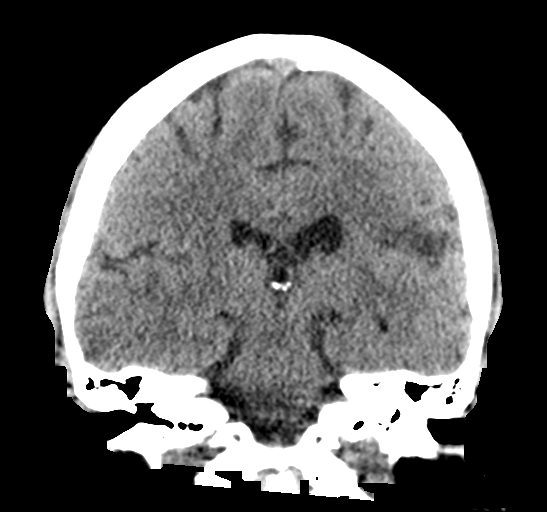

[Series 5: sagittal soft · sagittal · 0.33mm/px · 3 of 58 slices shown]
[im 20/58  brain]
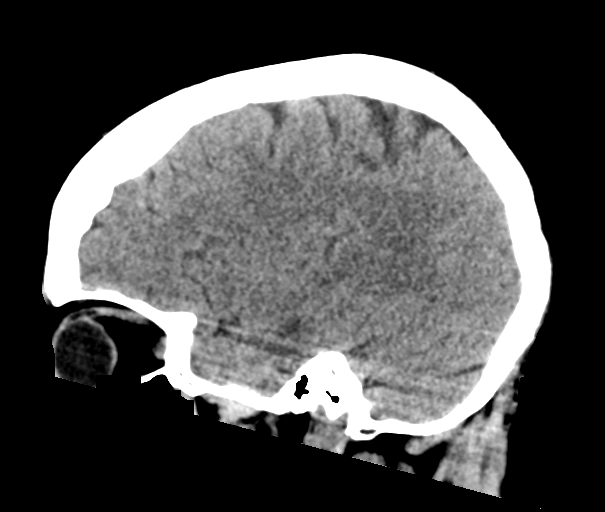
[im 29/58  brain]
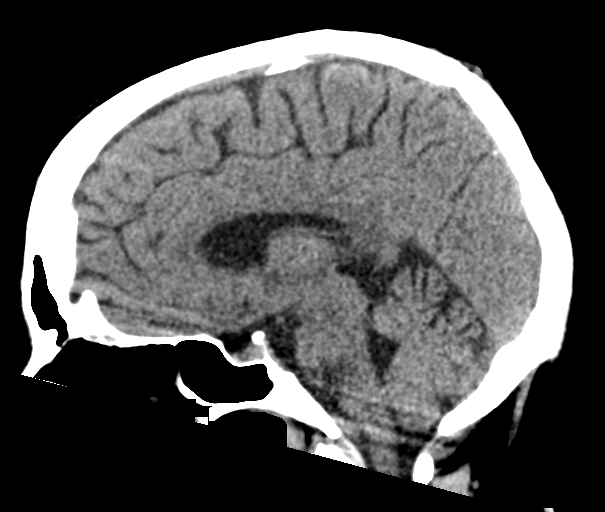
[im 39/58  brain]
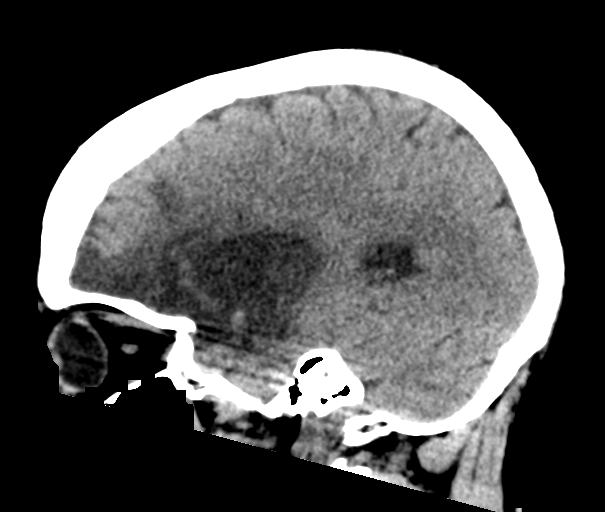

[16 of 47 positions shown; findings below may reference images not displayed]

FINDINGS: Brain: Similar appearance of encephalomalacia involving the left
frontotemporal region. Similar adjacent ex vacuo dilation of the
frontal horn left lateral ventricle. No evidence of acute/new large
vascular territory infarct. No acute hemorrhage. No hydrocephalus.
No mass lesion or abnormal mass effect.

Vascular: No hyperdense vessel or unexpected calcification.

Skull: No acute fracture.

Sinuses/Orbits: The sinuses are largely clear.  Unremarkable orbits.

Other: No mastoid effusions.
IMPRESSION: 1. No evidence of acute intracranial abnormality.
2. Stable left frontotemporal encephalomalacia.
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
## 2020-05-23 NOTE — ED Triage Notes (Addendum)
Pt brought in by [facility] from [facility] in [place]. Pt was having PT and started having a severe sharp headache and became dizzy. EMS was called per family. When EMS arrived pt was lying down and dizziness had subsided. Pt reports headache is better, but not gone. Pt has hx of 4 brain aneurysms per nursing facility. CBG 421 for EMS. CBG 286 here at the hospital.

## 2020-05-23 NOTE — Discharge Instructions (Addendum)
Return if there is any problems

## 2020-05-29 ENCOUNTER — Ambulatory Visit: Payer: 59 | Admitting: Physician Assistant

## 2020-05-30 ENCOUNTER — Ambulatory Visit: Payer: 59 | Admitting: Physician Assistant
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
## 2020-06-05 ENCOUNTER — Encounter: Payer: Self-pay | Admitting: Physician Assistant

## 2020-06-12 ENCOUNTER — Other Ambulatory Visit: Payer: Self-pay | Admitting: Physician Assistant

## 2020-06-12 DIAGNOSIS — E876 Hypokalemia: Secondary | ICD-10-CM

## 2020-06-12 DIAGNOSIS — R4701 Aphasia: Secondary | ICD-10-CM

## 2020-07-03 ENCOUNTER — Emergency Department (HOSPITAL_COMMUNITY): Admission: EM | Admit: 2020-07-03 | Discharge: 2020-07-03 | Discharge status: Against Medical Advice | Payer: 59

## 2020-07-05 ENCOUNTER — Encounter: Payer: Self-pay | Admitting: Physician Assistant

## 2020-07-05 ENCOUNTER — Ambulatory Visit (INDEPENDENT_AMBULATORY_CARE_PROVIDER_SITE_OTHER): Payer: 59 | Admitting: Physician Assistant

## 2020-07-05 ENCOUNTER — Other Ambulatory Visit: Payer: Self-pay

## 2020-07-05 VITALS — BP 136/84 | HR 98 | Temp 98.8°F | Ht 67.0 in | Wt 193.0 lb

## 2020-07-05 DIAGNOSIS — Z794 Long term (current) use of insulin: Secondary | ICD-10-CM

## 2020-07-05 DIAGNOSIS — I4891 Unspecified atrial fibrillation: Secondary | ICD-10-CM

## 2020-07-05 DIAGNOSIS — Z23 Encounter for immunization: Secondary | ICD-10-CM

## 2020-07-05 DIAGNOSIS — E1143 Type 2 diabetes mellitus with diabetic autonomic (poly)neuropathy: Secondary | ICD-10-CM

## 2020-07-05 DIAGNOSIS — I720 Aneurysm of carotid artery: Secondary | ICD-10-CM

## 2020-07-05 DIAGNOSIS — I63512 Cerebral infarction due to unspecified occlusion or stenosis of left middle cerebral artery: Secondary | ICD-10-CM

## 2020-07-05 DIAGNOSIS — K219 Gastro-esophageal reflux disease without esophagitis: Secondary | ICD-10-CM

## 2020-07-05 DIAGNOSIS — I5023 Acute on chronic systolic (congestive) heart failure: Secondary | ICD-10-CM

## 2020-07-05 DIAGNOSIS — R4701 Aphasia: Secondary | ICD-10-CM

## 2020-07-05 DIAGNOSIS — H539 Unspecified visual disturbance: Secondary | ICD-10-CM

## 2020-07-05 MED ORDER — PANTOPRAZOLE SODIUM 40 MG PO TBEC: 40.0000 mg | DELAYED_RELEASE_TABLET | Freq: Every day | ORAL | 1 refills | Status: DC

## 2020-07-05 NOTE — Progress Notes (Signed)
Robin [name] is a 54 y.o. female here for a follow up of a pre-existing problem.  History of Present Illness:   Chief Complaint  Patient presents with  . Medication Problem    discuss medications, was taken off some at hospital and while at rehab    HPI   Patient was admitted to a SNF after hospitalization in October for ARF, CHF, AMS. She ended up signing herself out of rehab on 11/22 or 11/23. She was there working on PT, ST and getting skilled nursing care so she could get stronger to have brain aneurysm coiling procedure. She is here to discuss her medications to review her medications and changes that were made at the SNF.  Diabetes Last visit with endocrinology was 09/13/19 for evaluation by Dr. Kelton Pillar. She was told to take metformin 1000 mg daily, novolog 24 units with breakfast and 30 units with supper and trulicity 9.92 mg weekly. During her hospitalization her metformin was held due to ARF. During her SNF it was not restarted. She was also transitioned to lantus 14 units. She continues on lantus only at this time. Her blood sugars are checked only at bedtime, which is not consistently 2 hours after dinner or right after dinner. Blood sugars are consistently 300-400 per family members.  GERD She was transitioned from protonix to prilosec for unclear reasons. She felt much improved with protonix and is hoping to switch back to this. Having breakthrough GERD with prilosec. Per family when patient is with her boyfriend, she eats whatever she wants and likely consuming high fat, spicy, and other trigger foods.  Atrial Fibrillation and CHF Digoxin was held given in the hospital given her bradycardia and she has yet to follow-up with cardiology -- does have an appointment in 2 weeks. Torsemide was also held due to ARF. Patient denies any excessive weight gain, swelling in LE, cough, chest pain, SOB.  Aneurysm of R carotid artery; CVA Embolization has been postponed after her recent  AMS and hospitalization. She was on chronic Eliquis after her stroke, but due to plan of have coiling procedure she was switched to dual antiplatelet agents -- ASA and ticagrelor. At this time she remains on ticagrelor and is not currently on anticoagulation.  Vision changes She continues to have pain and vision concerns in her L eye. Has not had a formal eye exam in years. Would like a referral.  Rash Since hospitalization has areas of darkened skin to forehead. On both sides. Not itchy, blistery, or painful. Improving with time. Family cleans area and moisturizes daily. Did have EEG in hospital and thinks she may have had a reaction to the adhesive.  Past Medical History:  Diagnosis Date  . A-fib ([place])   . Allergy   . Anemia   . Arthritis   . CHF (congestive heart failure) ([place])    a. EF at 30-35% by echo in 08/2018 b. EF at 35% by repeat echo in 05/2019  . Chronic abdominal pain   . Chronic atrial fibrillation ([place])   . Cocaine abuse ([place])   . COPD (chronic obstructive pulmonary disease) ([place])   . Essential hypertension, benign   . Expressive aphasia   . Expressive aphasia    post CVA  . Fatty liver   . GERD (gastroesophageal reflux disease)   . Gout 2016  . Normal coronary arteries    3/10 - following abnormal Myoview  . Ovarian cyst   . Stroke ([place]) 12/26/2019   Right sided weakness, and  expressive aphasia  . Thoracic ascending aortic aneurysm ([facility])    4 cm 10/31/19 CTA  . Type 2 diabetes mellitus ([place])    type II     Social History   Tobacco Use  . Smoking status: Former Smoker    Years: 29.00    Types: Cigarettes    Quit date: 12/2019    Years since quitting: 0.5  . Smokeless tobacco: Never Used  Vaping Use  . Vaping Use: Never used  Substance Use Topics  . Alcohol use: Not Currently    Comment: occ  . Drug use: Not Currently    Types: Cocaine, Marijuana    Past Surgical History:  Procedure Laterality Date  . ABDOMINAL HYSTERECTOMY  09/10/2011    Procedure: HYSTERECTOMY ABDOMINAL;  Surgeon: Jonnie Kind, MD;  Location: [facility];  Service: Gynecology;  Laterality: N/A;  Abdominal hysterectomy  . CESAREAN SECTION  [id number], and 1994  . CHOLECYSTECTOMY  1995  . IR 3D INDEPENDENT WKST  03/16/2020  . IR ANGIO INTRA EXTRACRAN SEL INTERNAL CAROTID BILAT MOD SED  03/16/2020  . IR ANGIO VERTEBRAL SEL SUBCLAVIAN INNOMINATE UNI L MOD SED  03/16/2020  . IR ANGIO VERTEBRAL SEL VERTEBRAL UNI R MOD SED  03/16/2020  . IR CT HEAD LTD  12/27/2019  . IR PERCUTANEOUS ART THROMBECTOMY/INFUSION INTRACRANIAL INC DIAG ANGIO  12/27/2019  . IR US GUIDE VASC ACCESS RIGHT  12/27/2019  . IR US GUIDE VASC ACCESS RIGHT  03/16/2020  . RADIOLOGY WITH ANESTHESIA N/A 12/27/2019   Procedure: IR WITH ANESTHESIA;  Surgeon: Luanne Bras, MD;  Location: [facility];  Service: Radiology;  Laterality: N/A;  . SCAR REVISION  09/10/2011   Procedure: SCAR REVISION;  Surgeon: Jonnie Kind, MD;  Location: [facility];  Service: Gynecology;  Laterality: N/A;  Wide Excision of old Cicatrix  . TUBAL LIGATION  1994    Family History  Problem Relation Age of Onset  . Cirrhosis Mother   . Early death Mother   . Alcohol abuse Mother   . Diabetes type II Father   . Alcohol abuse Father   . Diabetes type II Sister   . Early death Brother   . Alcohol abuse Son   . Anesthesia problems Neg Hx   . Hypotension Neg Hx   . Malignant hyperthermia Neg Hx   . Pseudochol deficiency Neg Hx   . Colon cancer Neg Hx   . Colon polyps Neg Hx   . Esophageal cancer Neg Hx   . Rectal cancer Neg Hx   . Stomach cancer Neg Hx     Allergies  Allergen Reactions  . Bee Venom Shortness Of Breath and Swelling    Bodily Swelling  . Losartan Other (See Comments)    Nosebleeds per patient report.   . Naproxen Other (See Comments)    Acid reflux  . Penicillins Nausea Only    Has patient had a PCN reaction causing immediate rash, facial/tongue/throat swelling, SOB or lightheadedness with hypotension: no Has  patient had a PCN reaction causing severe rash involving mucus membranes or skin necrosis: no Has patient had a PCN reaction that required hospitalization no Has patient had a PCN reaction occurring within the last 10 years: no If all of the above answers are "NO", then may proceed with Cephalosporin   . Lisinopril     Sinus congestion    Current Medications:   Current Outpatient Medications:  .  acetaminophen (TYLENOL) 325 MG tablet, Take 650 mg by mouth every 8 (  eight) hours as needed for mild pain., Disp: , Rfl:  .  BD PEN NEEDLE NANO U/F 32G X 4 MM MISC, Inject into the skin as directed., Disp: , Rfl:  .  carvedilol (COREG) 25 MG tablet, Take 1 tablet (25 mg total) by mouth 2 (two) times daily with a meal. Hold for HR<70, SBP<100, Disp: 60 tablet, Rfl: 0 .  citalopram (CELEXA) 10 MG tablet, Take 1 tablet by mouth once daily, Disp: 30 tablet, Rfl: 0 .  gabapentin (NEURONTIN) 100 MG capsule, Take 1 capsule (100 mg total) by mouth 3 (three) times daily., Disp: 270 capsule, Rfl: 3 .  hydrALAZINE (APRESOLINE) 25 MG tablet, Take 1 tablet (25 mg total) by mouth 3 (three) times daily., Disp: 120 tablet, Rfl: 11 .  insulin glargine (LANTUS) 100 UNIT/ML injection, Inject 10 Units into the skin at bedtime., Disp: , Rfl:  .  isosorbide mononitrate (IMDUR) 30 MG 24 hr tablet, Take 2 tablets (60 mg total) by mouth daily., Disp: 60 tablet, Rfl: 11 .  rosuvastatin (CRESTOR) 20 MG tablet, Take 1 tablet by mouth once daily, Disp: 30 tablet, Rfl: 0 .  ticagrelor (BRILINTA) 90 MG TABS tablet, Take 45 mg by mouth 2 (two) times daily., Disp: , Rfl:  .  alum & mag hydroxide-simeth (MAALOX/MYLANTA) 200-200-20 MG/5ML suspension, Take 30 mLs by mouth every 6 (six) hours as needed for indigestion or heartburn. (Patient not taking: Reported on 07/05/2020), Disp: 355 mL, Rfl: 0 .  aspirin EC 81 MG tablet, Take 81 mg by mouth daily.  (Patient not taking: Reported on 07/05/2020), Disp: , Rfl:  .  feeding supplement,  ENSURE ENLIVE, (ENSURE ENLIVE) LIQD, Take 237 mLs by mouth 2 (two) times daily between meals. (Patient not taking: Reported on 07/05/2020), Disp: 237 mL, Rfl: 12 .  nitroGLYCERIN (NITROSTAT) 0.4 MG SL tablet, Place 1 tablet (0.4 mg total) under the tongue every 5 (five) minutes as needed for chest pain. (Patient not taking: Reported on 07/05/2020), Disp: 30 tablet, Rfl: 12 .  nystatin ointment (MYCOSTATIN), Apply to affected area 1-2 times daily (Patient not taking: Reported on 07/05/2020), Disp: 30 g, Rfl: 0 .  pantoprazole (PROTONIX) 40 MG tablet, Take 1 tablet (40 mg total) by mouth daily., Disp: 90 tablet, Rfl: 1   Review of Systems:   ROS Negative unless otherwise specified per HPI.  Vitals:   Vitals:   07/05/20 1303  BP: 136/84  Pulse: 98  Temp: 98.8 F (37.1 C)  TempSrc: Temporal  SpO2: 98%  Weight: 193 lb (87.5 kg)  Height: 5\' 7"  (1.702 m)     Body mass index is 30.23 kg/m.  Physical Exam:   Physical Exam Vitals and nursing note reviewed.  Constitutional:      General: She is not in acute distress.    Appearance: She is well-developed. She is not ill-appearing or toxic-appearing.  Cardiovascular:     Rate and Rhythm: Normal rate. Rhythm irregularly irregular.     Pulses: Normal pulses.     Heart sounds: Normal heart sounds, S1 normal and S2 normal.     Comments: No LE edema Pulmonary:     Effort: Pulmonary effort is normal.     Breath sounds: Normal breath sounds.  Skin:    General: Skin is warm and dry.     Comments: Dark pigmented patches to bilateral side of forehead R>L; without lesions or discharge  Neurological:     Mental Status: She is alert.     GCS: GCS eye subscore  is 4. GCS verbal subscore is 5. GCS motor subscore is 6.  Psychiatric:        Behavior: Behavior normal. Behavior is cooperative.     Comments: Aphasic speech    Assessment and Plan:   Jennie was seen today for medication problem.  Diagnoses and all orders for this visit:  Type 2  diabetes mellitus with diabetic autonomic neuropathy, with long-term current use of insulin ([place]) Uncontrolled. Too soon to update A1c. Provided phone number for [facility] for family to call and schedule follow up with Dr. Kelton Pillar. Update renal function and consider restarting metformin if labs permit. Daughter feels like she is able to help to trial a sliding scale for better blood sugar control.   Patient and daughter were advised as follows: Start with daily Lantus 14 units that you are currently already on Check FASTING blood sugar every morning. Our goal is to get your fasting blood sugar levels to 90-120. Check blood sugar in the morning. If blood sugar is >120, add 2 units to insulin -- give a total of 16 units of insulin that day.  Continue this number of units for 3 more days. After the third day, if blood sugar is >120, add 2 units to insulin (would increase to 18 total units) -- Continue to increase every 3 days by 2 units until fasting blood sugars are closer to 120. Call if questions. -     Lipid panel; Future -     Lipid panel  Gastroesophageal reflux disease, unspecified whether esophagitis present Uncontrolled. No red flags. Stop prilosec and start protonix. Follow up if any worsening symptoms.  Atrial fibrillation with RVR ([place]); Acute on chronic systolic heart failure ([place]) I have personally messaged patient's cardiologist, Bernerd Pho, regarding patient's medications for specific recommendations. Specifically regarding  her digoxin, torsemide and current lack of anticoagulation (she remains on antiplatelet therapy only at this time with no planned coiling surgery scheduled yet.) Provided blood pressure log for patient and have asked her to record daily BP and HR for cardiology to review. Will also help relay any specific changes from cardiology to patient if indicated. No red flags on exam and no evidence of volume overload on my exam today. -     Lipid panel;  Future -     Lipid panel -     CBC with Differential/Platelet; Future -     Comprehensive metabolic panel; Future -     Comprehensive metabolic panel -     CBC with Differential/Platelet  Aneurysm of right carotid artery ([place]) paraclinoid I have asked her to call her neurologist to discuss plan for procedure and to schedule follow-up.  Left middle cerebral artery stroke ([place]); Aphasia Requesting referral for home health speech therapy. -     Ambulatory referral to [facility] changes Chronic issue per patient; referral placed for evaluation. -     Ambulatory referral to Ophthalmology  Rash Possible contact dermatitis. Improving with time. Continue current regimen, continue OTC hydrocortisone cream if worsens or changes.  Need for influenza vaccination -     Flu Vaccine QUAD 6+ mos PF IM (Fluarix Quad PF)  Other orders -     pantoprazole (PROTONIX) 40 MG tablet; Take 1 tablet (40 mg total) by mouth daily.   Time spent with patient today was 80 minutes which consisted of chart review, contacting specialists, discussing diagnosis, work up, treatment answering questions and documentation.  Inda Coke, PA-C

## 2020-07-05 NOTE — Patient Instructions (Addendum)
It was great to see you!  We are going to place referral for home health speech therapy and ophthalmology. You should hear something within the next two weeks about an appointment for this.  Update your blood work today. We will call [name] with the results and fax them accordingly.  Resume protonix -- I have sent this in. Discontinue prilosec.  For your rash -- trial over the counter cortisone ointment to see if this helps.  Likely resume metformin -- but I am going to wait until your labs have returned.  Neurology: --Please ask Deveshwar's office if/when to resume aspirin for procedure.  Cardiology: -- Appt on 12/15 --Please ask about resuming digoxin, torsemide, and any other medications that are indicated --Please keep blood pressure AND heart rate log for the cardiologist  Endocrinology --Please call [facility] for an appointment [phone] to follow-up on your blood sugar management  For your insulin Start with daily Lantus 14 units that you are currently already on Check FASTING blood sugar every morning. Our goal is to get your fasting blood sugar levels to 90-120.  Check blood sugar in the morning. If blood sugar is >120, add 2 units to insulin -- give a total of 16 units of insulin that day.  Continue this number of units for 3 more days. After the third day, if blood sugar is >120, add 2 units to insulin (would increase to 18 total units)  Continue to increase every 3 days by 2 units until fasting blood sugars are closer to 120. Call if questions.   Take care,  Inda Coke PA-C

## 2020-07-06 LAB — COMPREHENSIVE METABOLIC PANEL
AG Ratio: 1.5 (calc) (ref 1.0–2.5)
ALT: 7 U/L (ref 6–29)
AST: 10 U/L (ref 10–35)
Albumin: 3.8 g/dL (ref 3.6–5.1)
Alkaline phosphatase (APISO): 71 U/L (ref 37–153)
BUN/Creatinine Ratio: 11 (calc) (ref 6–22)
BUN: 19 mg/dL (ref 7–25)
CO2: 24 mmol/L (ref 20–32)
Calcium: 9 mg/dL (ref 8.6–10.4)
Chloride: 110 mmol/L (ref 98–110)
Creat: 1.76 mg/dL — ABNORMAL HIGH (ref 0.50–1.05)
Globulin: 2.5 g/dL (calc) (ref 1.9–3.7)
Glucose, Bld: 147 mg/dL — ABNORMAL HIGH (ref 65–99)
Potassium: 4.2 mmol/L (ref 3.5–5.3)
Sodium: 140 mmol/L (ref 135–146)
Total Bilirubin: 0.5 mg/dL (ref 0.2–1.2)
Total Protein: 6.3 g/dL (ref 6.1–8.1)

## 2020-07-06 LAB — CBC WITH DIFFERENTIAL/PLATELET
Absolute Monocytes: 490 cells/uL (ref 200–950)
Basophils Absolute: 40 cells/uL (ref 0–200)
Basophils Relative: 0.7 %
Eosinophils Absolute: 211 cells/uL (ref 15–500)
Eosinophils Relative: 3.7 %
HCT: 27.6 % — ABNORMAL LOW (ref 35.0–45.0)
Hemoglobin: 8.7 g/dL — ABNORMAL LOW (ref 11.7–15.5)
Lymphs Abs: 2041 cells/uL (ref 850–3900)
MCH: 27.9 pg (ref 27.0–33.0)
MCHC: 31.5 g/dL — ABNORMAL LOW (ref 32.0–36.0)
MCV: 88.5 fL (ref 80.0–100.0)
MPV: 10.7 fL (ref 7.5–12.5)
Monocytes Relative: 8.6 %
Neutro Abs: 2918 cells/uL (ref 1500–7800)
Neutrophils Relative %: 51.2 %
Platelets: 241 10*3/uL (ref 140–400)
RBC: 3.12 10*6/uL — ABNORMAL LOW (ref 3.80–5.10)
RDW: 13.1 % (ref 11.0–15.0)
Total Lymphocyte: 35.8 %
WBC: 5.7 10*3/uL (ref 3.8–10.8)

## 2020-07-06 LAB — LIPID PANEL
Cholesterol: 58 mg/dL (ref <200)
HDL: 28 mg/dL — ABNORMAL LOW (ref >=50)
LDL Cholesterol (Calc): 17 mg/dL (calc)
Non-HDL Cholesterol (Calc): 30 mg/dL (calc) (ref <130)
Total CHOL/HDL Ratio: 2.1 (calc) (ref <5.0)
Triglycerides: 51 mg/dL (ref <150)

## 2020-07-07 ENCOUNTER — Other Ambulatory Visit: Payer: Self-pay | Admitting: Physician Assistant

## 2020-07-07 DIAGNOSIS — R7989 Other specified abnormal findings of blood chemistry: Secondary | ICD-10-CM

## 2020-07-07 DIAGNOSIS — R710 Precipitous drop in hematocrit: Secondary | ICD-10-CM

## 2020-07-09 ENCOUNTER — Other Ambulatory Visit: Payer: Self-pay

## 2020-07-09 ENCOUNTER — Observation Stay (HOSPITAL_COMMUNITY)
Admission: EM | Admit: 2020-07-09 | Discharge: 2020-07-10 | Disposition: A | Discharge status: Home / Self Care | Payer: 59 | Attending: Family Medicine | Admitting: Family Medicine

## 2020-07-09 ENCOUNTER — Emergency Department (HOSPITAL_COMMUNITY): Payer: 59

## 2020-07-09 DIAGNOSIS — J449 Chronic obstructive pulmonary disease, unspecified: Secondary | ICD-10-CM | POA: Insufficient documentation

## 2020-07-09 DIAGNOSIS — Z20822 Contact with and (suspected) exposure to covid-19: Secondary | ICD-10-CM | POA: Diagnosis not present

## 2020-07-09 DIAGNOSIS — Z794 Long term (current) use of insulin: Secondary | ICD-10-CM | POA: Diagnosis not present

## 2020-07-09 DIAGNOSIS — I4891 Unspecified atrial fibrillation: Principal | ICD-10-CM | POA: Insufficient documentation

## 2020-07-09 DIAGNOSIS — I5023 Acute on chronic systolic (congestive) heart failure: Secondary | ICD-10-CM | POA: Diagnosis not present

## 2020-07-09 DIAGNOSIS — I5043 Acute on chronic combined systolic (congestive) and diastolic (congestive) heart failure: Secondary | ICD-10-CM | POA: Diagnosis not present

## 2020-07-09 DIAGNOSIS — N1832 Chronic kidney disease, stage 3b: Secondary | ICD-10-CM | POA: Diagnosis not present

## 2020-07-09 DIAGNOSIS — Z79899 Other long term (current) drug therapy: Secondary | ICD-10-CM | POA: Diagnosis not present

## 2020-07-09 DIAGNOSIS — I130 Hypertensive heart and chronic kidney disease with heart failure and stage 1 through stage 4 chronic kidney disease, or unspecified chronic kidney disease: Secondary | ICD-10-CM | POA: Diagnosis not present

## 2020-07-09 DIAGNOSIS — G8191 Hemiplegia, unspecified affecting right dominant side: Secondary | ICD-10-CM | POA: Diagnosis not present

## 2020-07-09 DIAGNOSIS — I4819 Other persistent atrial fibrillation: Secondary | ICD-10-CM

## 2020-07-09 DIAGNOSIS — R1012 Left upper quadrant pain: Secondary | ICD-10-CM | POA: Diagnosis present

## 2020-07-09 DIAGNOSIS — E1143 Type 2 diabetes mellitus with diabetic autonomic (poly)neuropathy: Secondary | ICD-10-CM | POA: Insufficient documentation

## 2020-07-09 LAB — CBC WITH DIFFERENTIAL/PLATELET
Abs Immature Granulocytes: 0.02 K/uL (ref 0.00–0.07)
Basophils Absolute: 0 K/uL (ref 0.0–0.1)
Basophils Relative: 1 %
Eosinophils Absolute: 0.2 K/uL (ref 0.0–0.5)
Eosinophils Relative: 3 %
HCT: 30.9 % — ABNORMAL LOW (ref 36.0–46.0)
Hemoglobin: 9.6 g/dL — ABNORMAL LOW (ref 12.0–15.0)
Immature Granulocytes: 0 %
Lymphocytes Relative: 28 %
Lymphs Abs: 1.5 K/uL (ref 0.7–4.0)
MCH: 27.6 pg (ref 26.0–34.0)
MCHC: 31.1 g/dL (ref 30.0–36.0)
MCV: 88.8 fL (ref 80.0–100.0)
Monocytes Absolute: 0.4 K/uL (ref 0.1–1.0)
Monocytes Relative: 7 %
Neutro Abs: 3.5 K/uL (ref 1.7–7.7)
Neutrophils Relative %: 61 %
Platelets: 239 K/uL (ref 150–400)
RBC: 3.48 MIL/uL — ABNORMAL LOW (ref 3.87–5.11)
RDW: 14.2 % (ref 11.5–15.5)
WBC: 5.6 K/uL (ref 4.0–10.5)
nRBC: 0 % (ref 0.0–0.2)

## 2020-07-09 LAB — RESP PANEL BY RT-PCR (FLU A&B, COVID) ARPGX2
Influenza A by PCR: NEGATIVE
Influenza B by PCR: NEGATIVE
SARS Coronavirus 2 by RT PCR: NEGATIVE

## 2020-07-09 LAB — URINALYSIS, ROUTINE W REFLEX MICROSCOPIC
Bilirubin Urine: NEGATIVE
Glucose, UA: 50 mg/dL — AB
Hgb urine dipstick: NEGATIVE
Ketones, ur: NEGATIVE mg/dL
Nitrite: NEGATIVE
Protein, ur: NEGATIVE mg/dL
Specific Gravity, Urine: 1.027 (ref 1.005–1.030)
pH: 7 (ref 5.0–8.0)

## 2020-07-09 LAB — COMPREHENSIVE METABOLIC PANEL
ALT: 13 U/L (ref 0–44)
AST: 14 U/L — ABNORMAL LOW (ref 15–41)
Albumin: 4 g/dL (ref 3.5–5.0)
Alkaline Phosphatase: 81 U/L (ref 38–126)
Anion gap: 7 (ref 5–15)
BUN: 20 mg/dL (ref 6–20)
CO2: 22 mmol/L (ref 22–32)
Calcium: 9.1 mg/dL (ref 8.9–10.3)
Chloride: 107 mmol/L (ref 98–111)
Creatinine, Ser: 1.65 mg/dL — ABNORMAL HIGH (ref 0.44–1.00)
GFR, Estimated: 37 mL/min — ABNORMAL LOW (ref >60)
Glucose, Bld: 166 mg/dL — ABNORMAL HIGH (ref 70–99)
Potassium: 3.9 mmol/L (ref 3.5–5.1)
Sodium: 136 mmol/L (ref 135–145)
Total Bilirubin: 0.9 mg/dL (ref 0.3–1.2)
Total Protein: 7.4 g/dL (ref 6.5–8.1)

## 2020-07-09 LAB — GLUCOSE, CAPILLARY
Glucose-Capillary: 151 mg/dL — ABNORMAL HIGH (ref 70–99)
Glucose-Capillary: 111 mg/dL — ABNORMAL HIGH (ref 70–99)

## 2020-07-09 LAB — RAPID URINE DRUG SCREEN, HOSP PERFORMED
Amphetamines: NOT DETECTED
Barbiturates: NOT DETECTED
Benzodiazepines: NOT DETECTED
Cocaine: NOT DETECTED
Opiates: NOT DETECTED
Tetrahydrocannabinol: NOT DETECTED

## 2020-07-09 LAB — TROPONIN I (HIGH SENSITIVITY)
Troponin I (High Sensitivity): 5 ng/L
Troponin I (High Sensitivity): 5 ng/L

## 2020-07-09 LAB — BRAIN NATRIURETIC PEPTIDE: B Natriuretic Peptide: 644 pg/mL — ABNORMAL HIGH (ref 0.0–100.0)

## 2020-07-09 LAB — D-DIMER, QUANTITATIVE: D-Dimer, Quant: 1.09 ug{FEU}/mL — ABNORMAL HIGH (ref 0.00–0.50)

## 2020-07-09 LAB — LIPASE, BLOOD: Lipase: 28 U/L (ref 11–51)

## 2020-07-09 IMAGING — CT CT ANGIO CHEST
2 of 6 series · 17 of 46 positions shown · IV contrast (Omnipaque or Isovue)
Comparison: [DATE]

CLINICAL DATA: Shortness of breath, effusion, and pleurisy.

EXAM:
CT ANGIOGRAPHY CHEST WITH CONTRAST
TECHNIQUE: Multidetector CT imaging of the chest was performed using the
standard protocol during bolus administration of intravenous
contrast. Multiplanar CT image reconstructions and MIPs were
obtained to evaluate the vascular anatomy.
CONTRAST:  60mL OMNIPAQUE IOHEXOL 350 MG/ML SOLN

[Series 5: pe axial thins · axial · 0.81mm/px · z∈[+988,+1274]mm · 14 of 314 slices shown]
[im 14/314  lung]
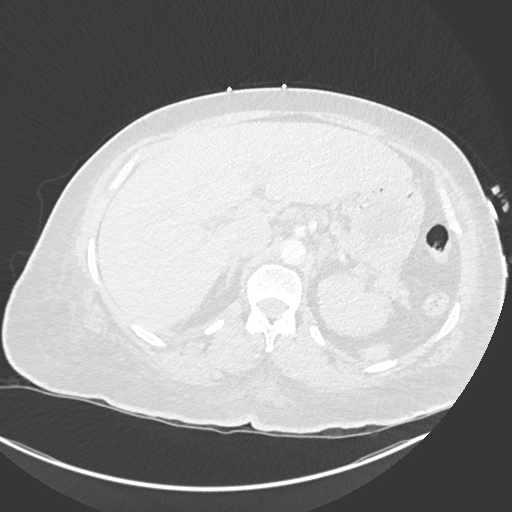
[im 41/314  soft-tissue]
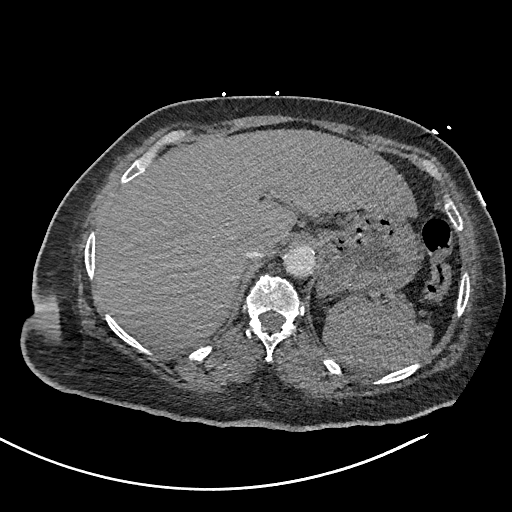
[im 55/314  lung]
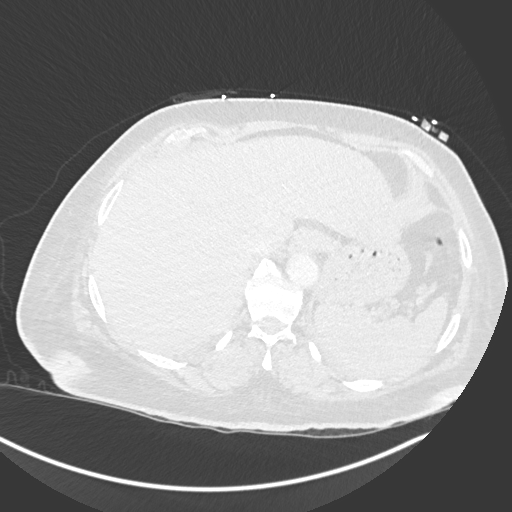
[im 82/314  soft-tissue]
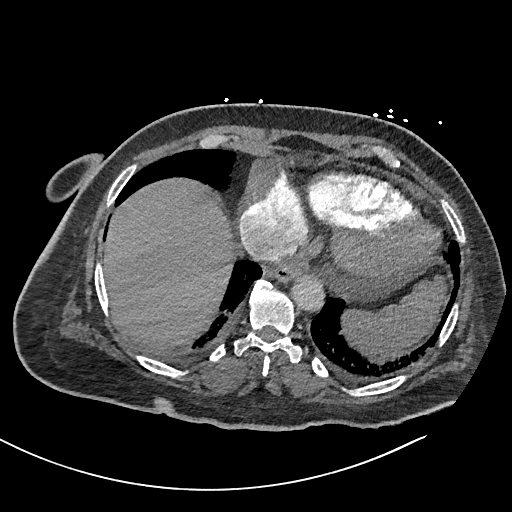
[im 109/314  lung]
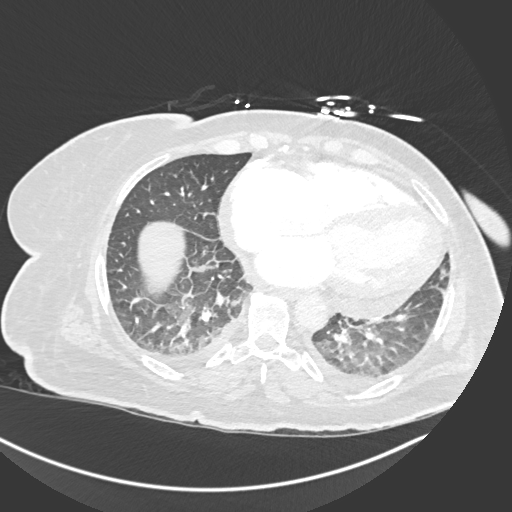
[im 123/314  soft-tissue]
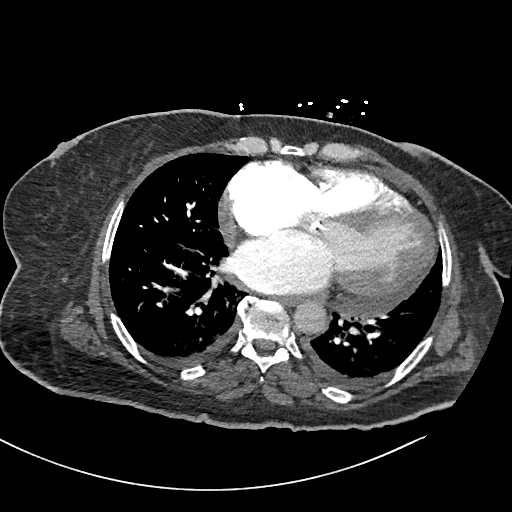
[im 150/314  lung]
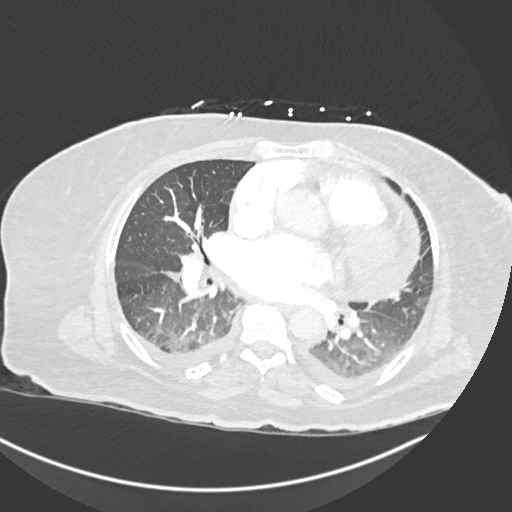
[im 164/314  soft-tissue]
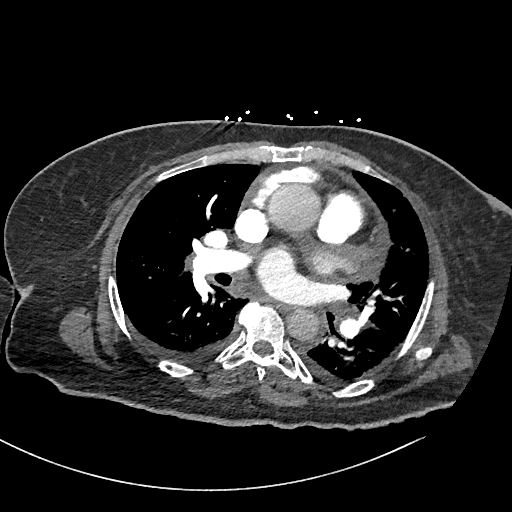
[im 191/314  lung]
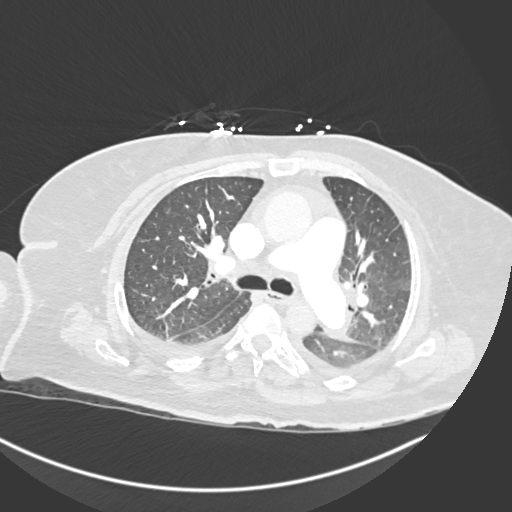
[im 205/314  soft-tissue]
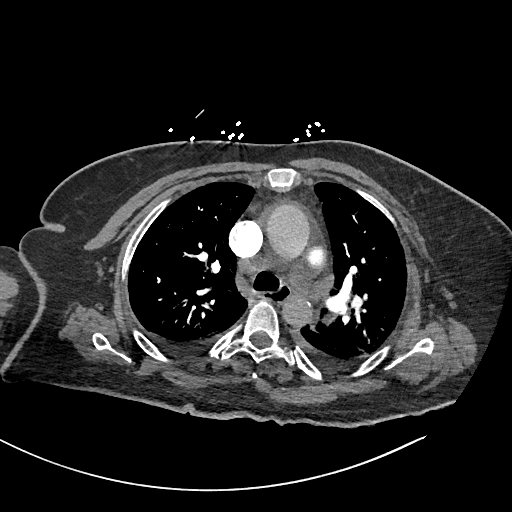
[im 232/314  lung]
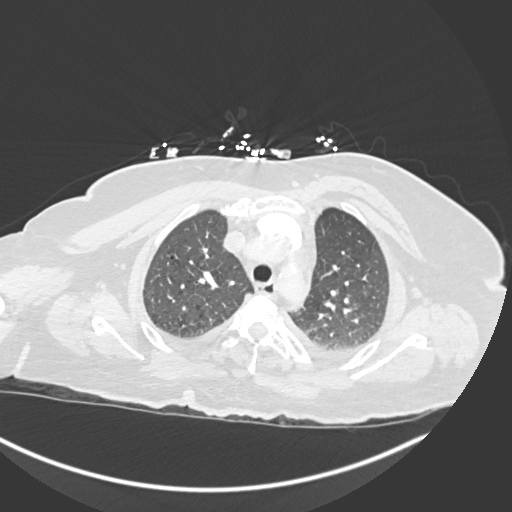
[im 259/314  soft-tissue]
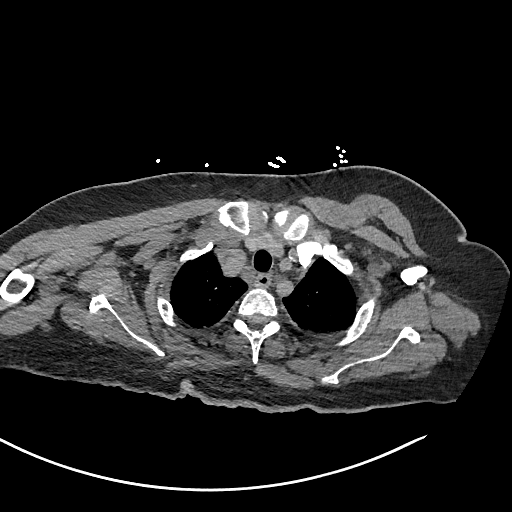
[im 273/314  lung]
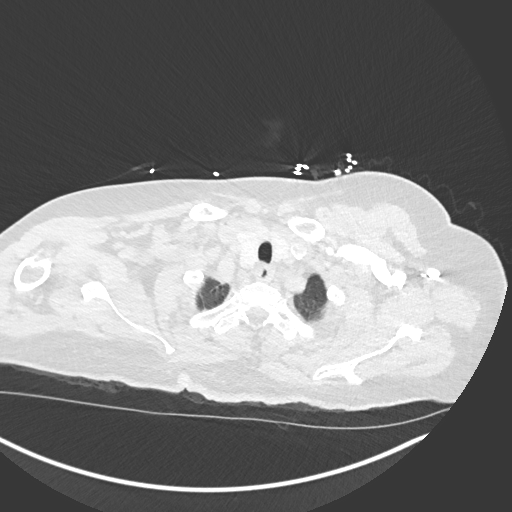
[im 300/314  soft-tissue]
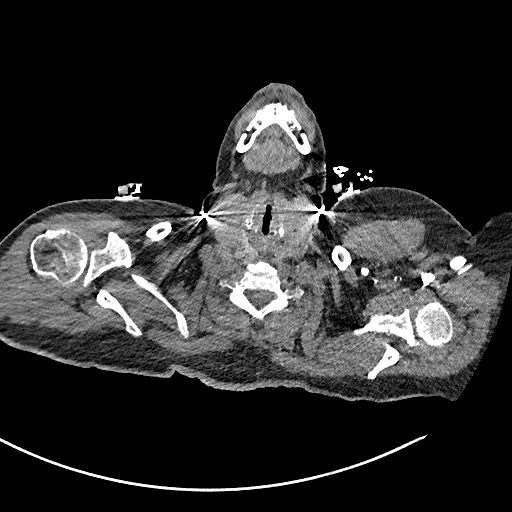

[Series 7: cor soft · coronal · 0.63mm/px · 3 of 146 slices shown]
[im 37/146  soft-tissue]
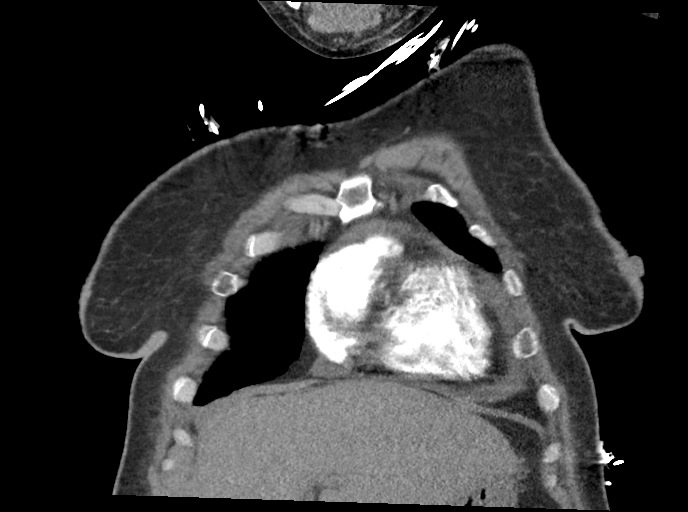
[im 73/146  soft-tissue]
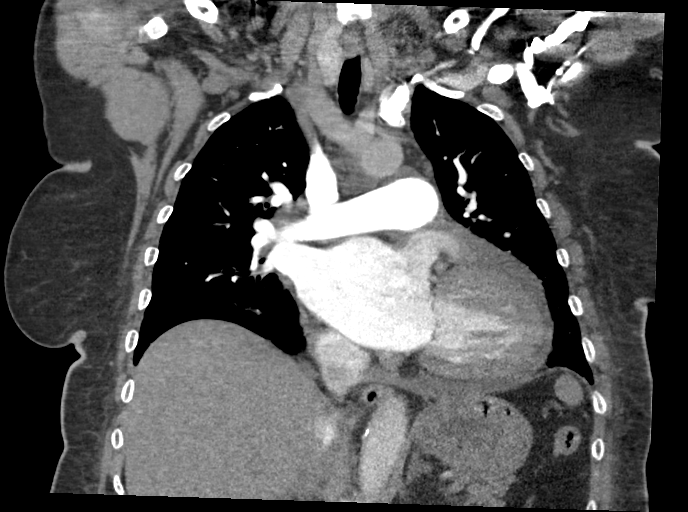
[im 109/146  soft-tissue]
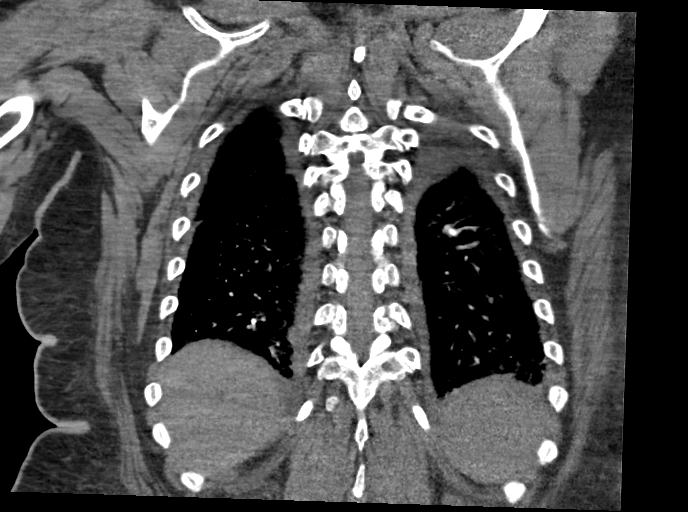

[17 of 46 positions shown; findings below may reference images not displayed]

FINDINGS: Cardiovascular: There is a 4.1 cm ascending thoracic aortic
aneurysm, unchanged by my measurement in the interval. No
dissection. No significant atherosclerotic change. Cardiomegaly
identified. No definite coronary artery atherosclerotic change. No
pulmonary emboli.

Mediastinum/Nodes: Small bilateral pleural effusions are identified.
There is also a pericardial effusion measuring up to 1.5 cm
posteriorly on series 4, image 69. The esophagus is normal. There
are numerous shotty nodes in the mediastinum which are more
prominent compared to [DATE]. The thyroid is unremarkable.

Lungs/Pleura: Central airways are normal. No pneumothorax.
Interlobular septal thickening consistent with mild edema.
Atelectasis is associated with the effusions. No suspicious
infiltrates to suggest pneumonia.

Upper Abdomen: No acute abnormality.

Musculoskeletal: There is a sclerotic lesion in a thoracic vertebral
body on series 8, image 99, unchanged since [DATE] and
[DATE], of doubtful significance.

Review of the MIP images confirms the above findings.
IMPRESSION: 1. No pulmonary emboli.
2. 4.1 cm ascending thoracic aortic aneurysm, unchanged by my
measurement. Recommend annual imaging followup by CTA or MRA. This
recommendation follows [CD]
ACCF/AHA/AATS/ACR/ASA/SCA/ROMBO/ROMBO/ROMBO/ROMBO Guidelines for the
Diagnosis and Management of Patients with Thoracic Aortic Disease.
Circulation. [CD]; 121: [id number]. Aortic aneurysm NOS ([CD]-[CD])
3. Cardiomegaly, small bilateral pleural effusions, and mild
pulmonary edema.
4. Pericardial effusion measuring up to 1.5 cm in thickness.
5. Shotty nodes in the mediastinum are likely reactive. Recommend
attention on follow-up.

Aortic aneurysm NOS ([CD]-[CD]).

## 2020-07-09 IMAGING — CT CT ABD-PELV W/ CM
2 of 5 series · 17 of 46 positions shown, 19 images · IV contrast (omnipaque)
Comparison: [DATE]

CLINICAL DATA: Lower abdominal pain, possible hernia

EXAM:
CT ABDOMEN AND PELVIS WITH CONTRAST
TECHNIQUE: Multidetector CT imaging of the abdomen and pelvis was performed
using the standard protocol following bolus administration of
intravenous contrast.
CONTRAST:  100mL OMNIPAQUE IOHEXOL 300 MG/ML  SOLN

[Series 2: axial st · axial · 0.91mm/px · z∈[+705,+1095]mm · 14 of 90 slices shown, 16 images]
[im 6/90  soft-tissue]
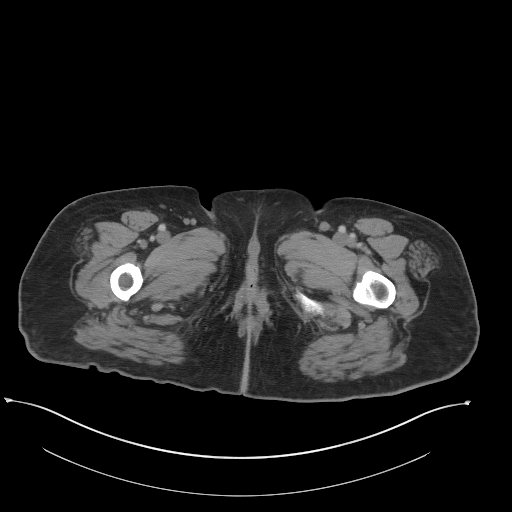
[im 6/90  bone]
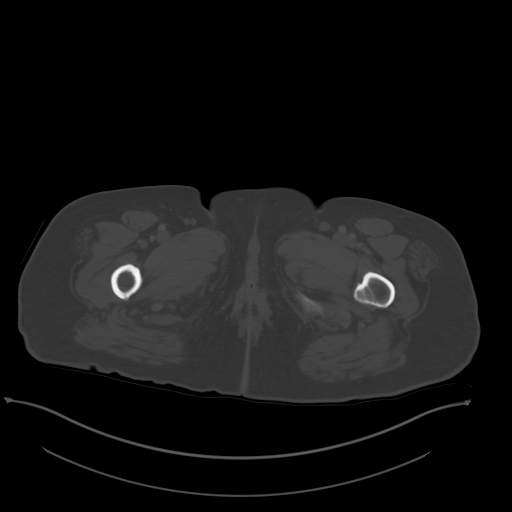
[im 12/90  soft-tissue]
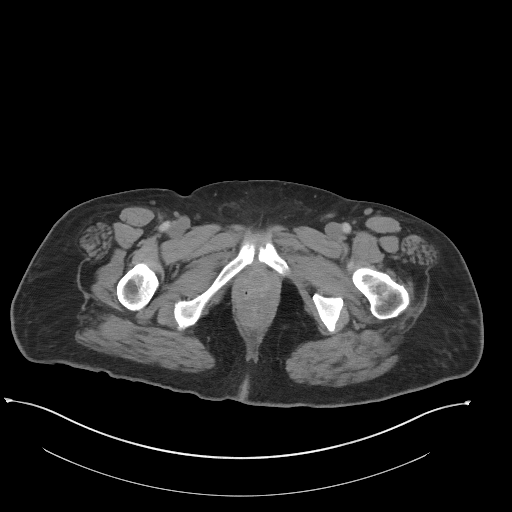
[im 18/90  soft-tissue]
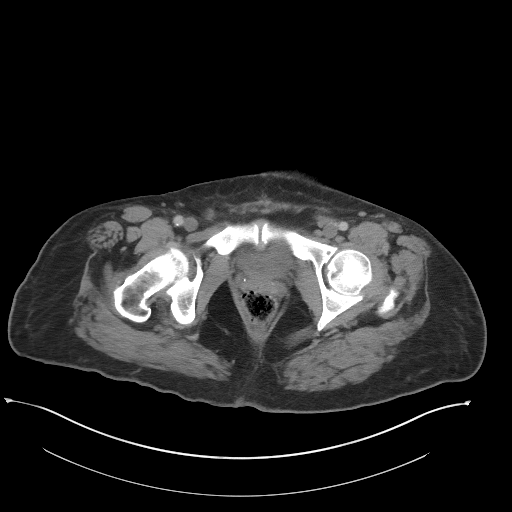
[im 24/90  soft-tissue]
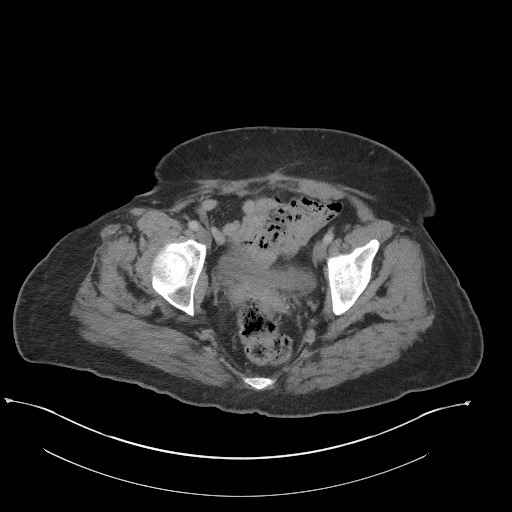
[im 30/90  soft-tissue]
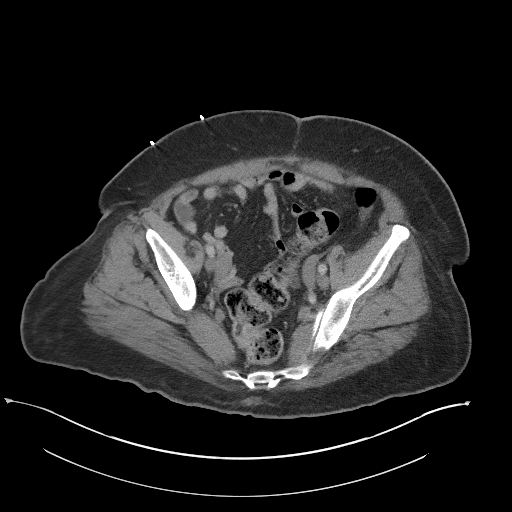
[im 36/90  soft-tissue]
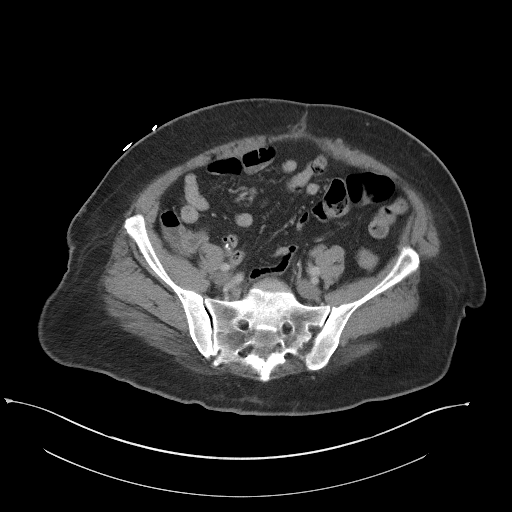
[im 42/90  soft-tissue]
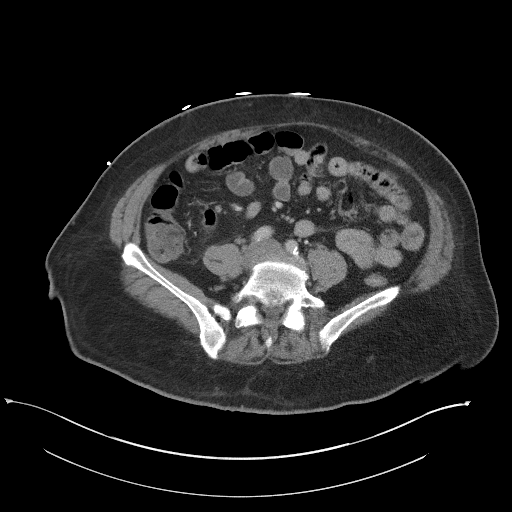
[im 48/90  soft-tissue]
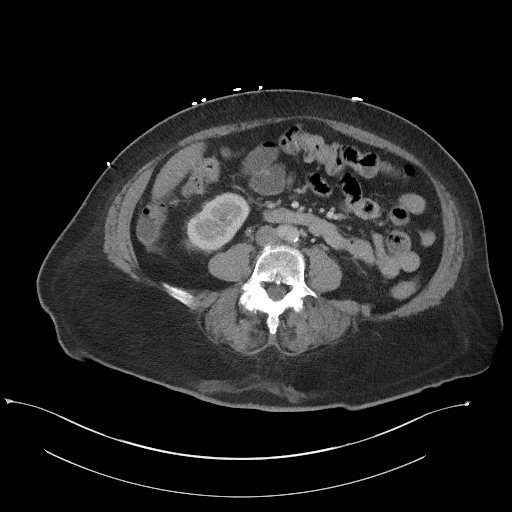
[im 54/90  soft-tissue]
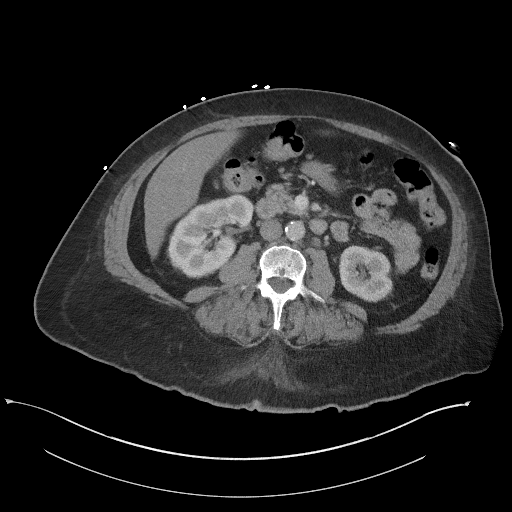
[im 54/90  bone]
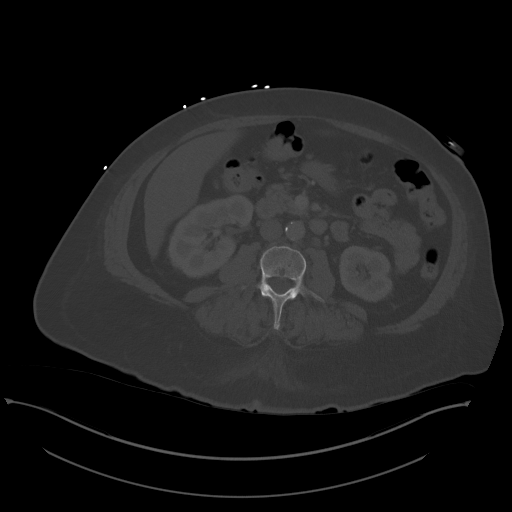
[im 60/90  soft-tissue]
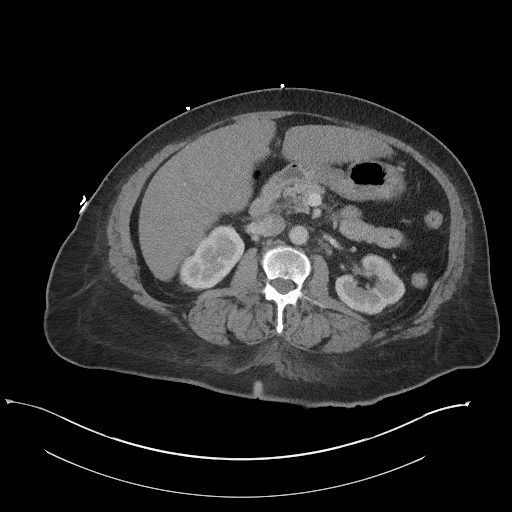
[im 66/90  soft-tissue]
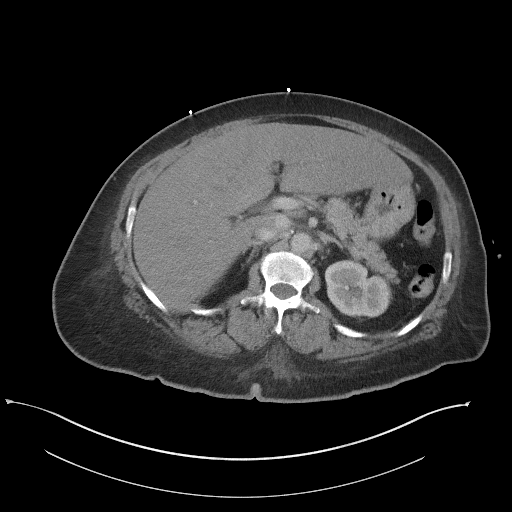
[im 72/90  soft-tissue]
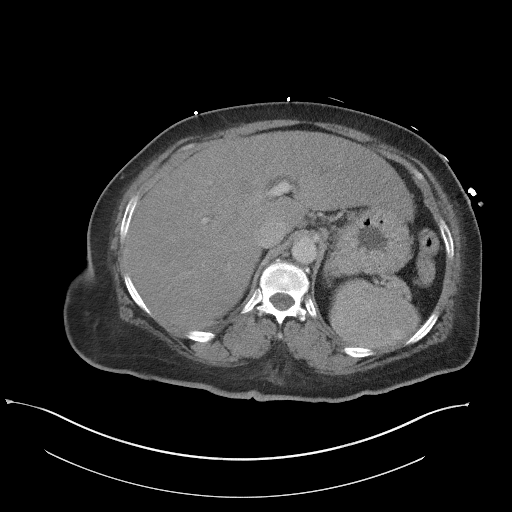
[im 78/90  soft-tissue]
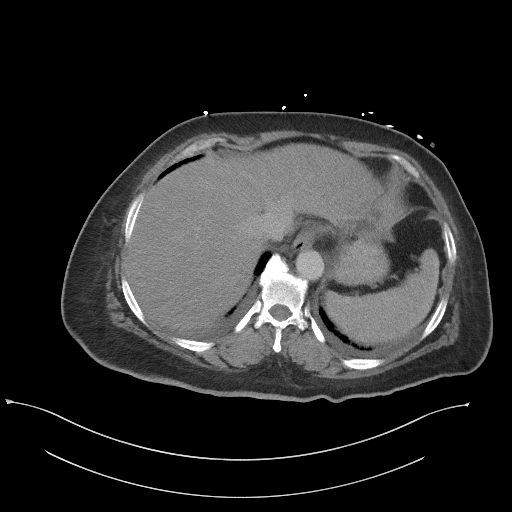
[im 84/90  soft-tissue]
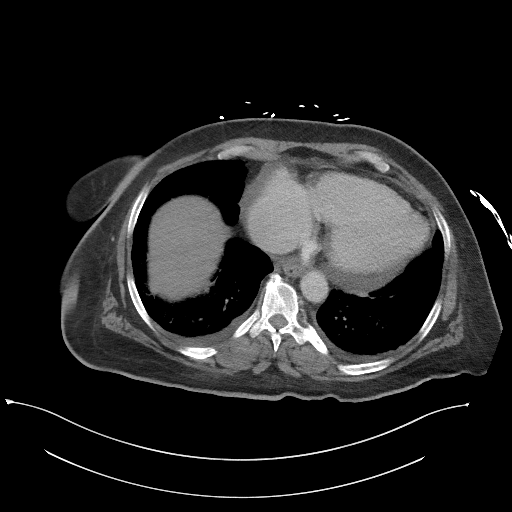

[Series 5: coronal st · coronal · 0.90mm/px · 3 of 109 slices shown]
[im 37/109  soft-tissue]
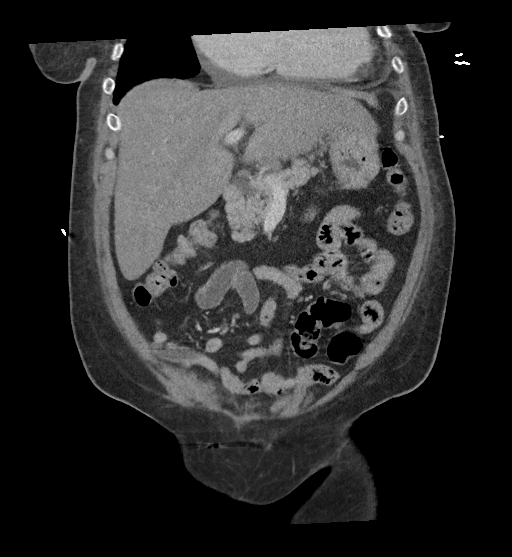
[im 49/109  soft-tissue]
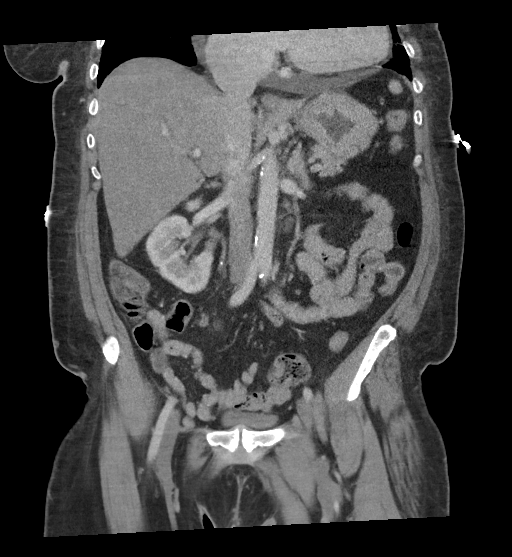
[im 61/109  soft-tissue]
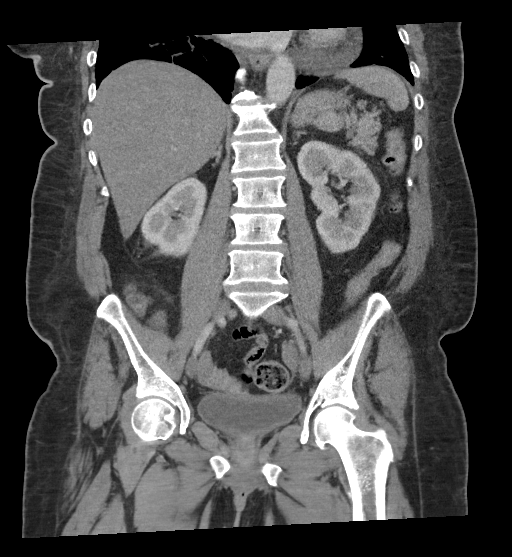

[17 of 46 positions shown; findings below may reference images not displayed]

FINDINGS: Lower chest: Small effusions are noted bilaterally. Mild dependent
atelectatic changes are seen. No focal confluent infiltrate is
noted. Small pericardial effusion is noted as well.

Hepatobiliary: Gallbladder is been surgically removed. Fatty
infiltration of the liver is noted.

Pancreas: Unremarkable. No pancreatic ductal dilatation or
surrounding inflammatory changes.

Spleen: Normal in size without focal abnormality.

Adrenals/Urinary Tract: The adrenal glands are within normal limits.
Kidneys demonstrate a normal enhancement pattern bilaterally. Normal
excretion is noted on delayed images. Bladder is partially distended

Stomach/Bowel: The appendix is well visualized and within normal
limits. No obstructive or inflammatory changes of the colon are
seen. Stomach and small bowel limits.

Vascular/Lymphatic: Aortic atherosclerosis. No enlarged abdominal or
pelvic lymph nodes. Multiple ovarian vein phleboliths are noted.

Reproductive: Status post hysterectomy. No adnexal masses.

Other: No abdominal wall hernia or abnormality. No abdominopelvic
ascites.

Musculoskeletal: Degenerative changes of the lumbar spine are noted.
IMPRESSION: Small bilateral effusions with bibasilar atelectatic changes.

Small pericardial effusion new from the prior study.

No other focal abnormality is noted.

## 2020-07-09 IMAGING — US US ABDOMEN COMPLETE
1 series · 14 of 25 positions shown · non-contrast
Comparison: CT abdomen and pelvis [DATE]

CLINICAL DATA: 53-year-old with abdominal pain.

EXAM:
ABDOMEN ULTRASOUND COMPLETE

[Series 1: us abdomen complete · 14 of 92 slices shown]
[im 1/92]
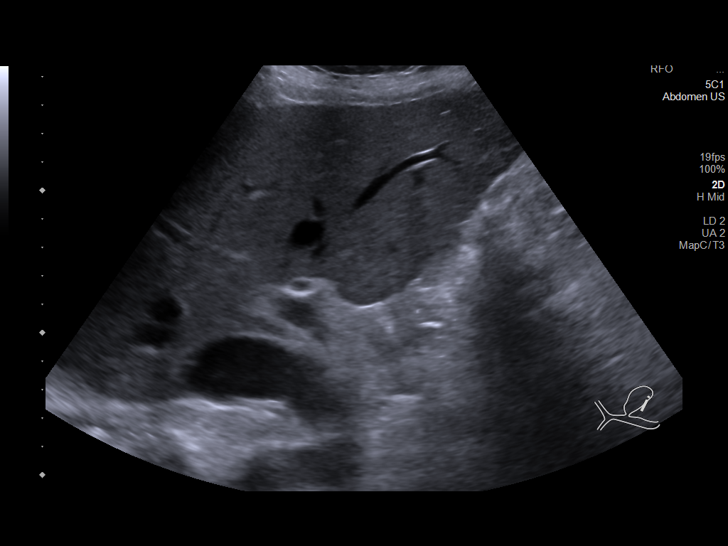
[im 8/92]
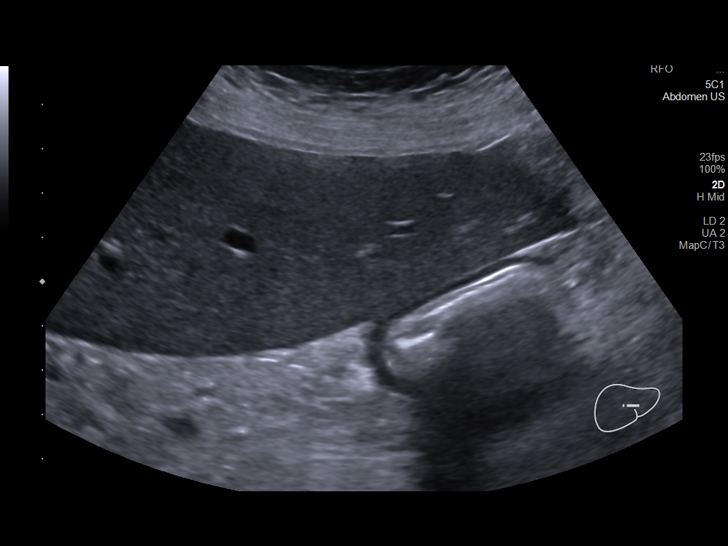
[im 16/92]
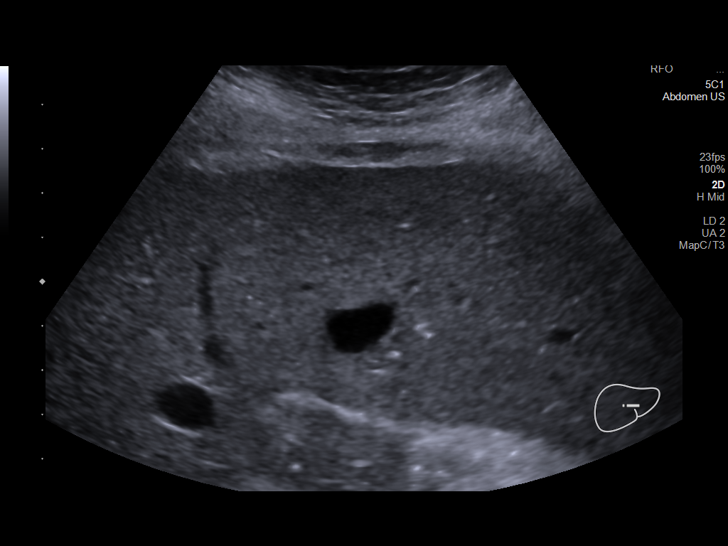
[im 23/92]
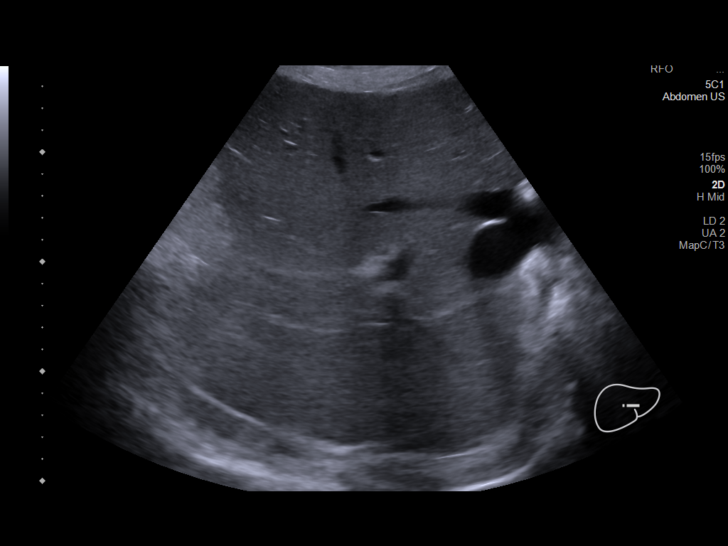
[im 31/92]
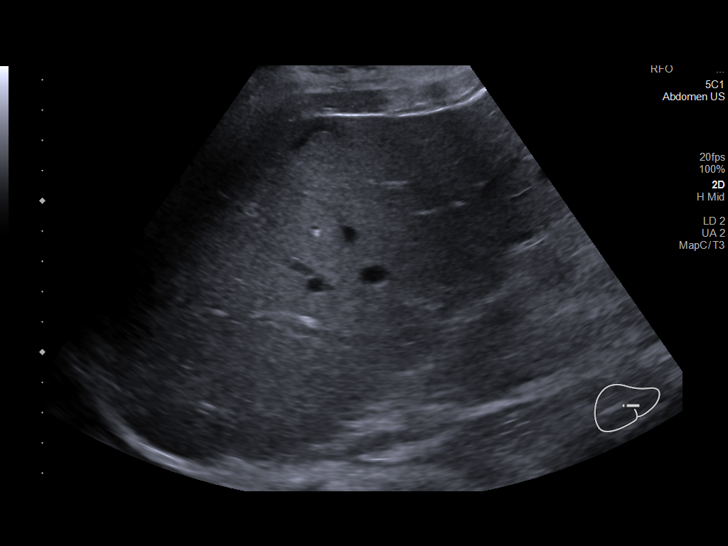
[im 35/92]
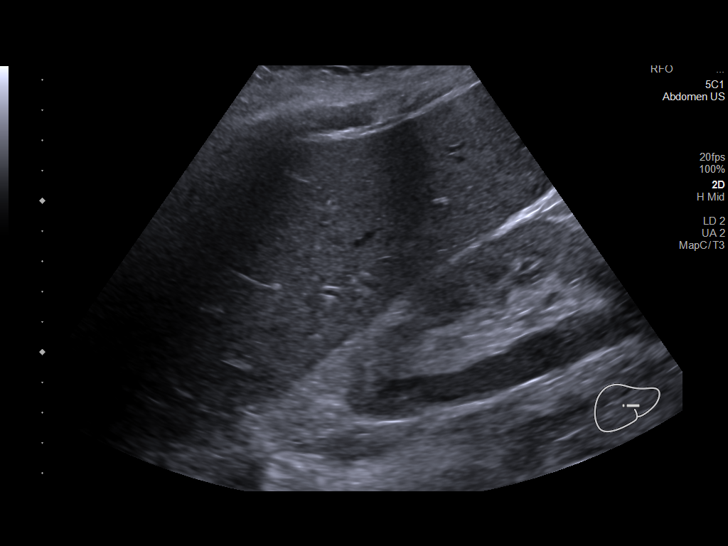
[im 42/92]
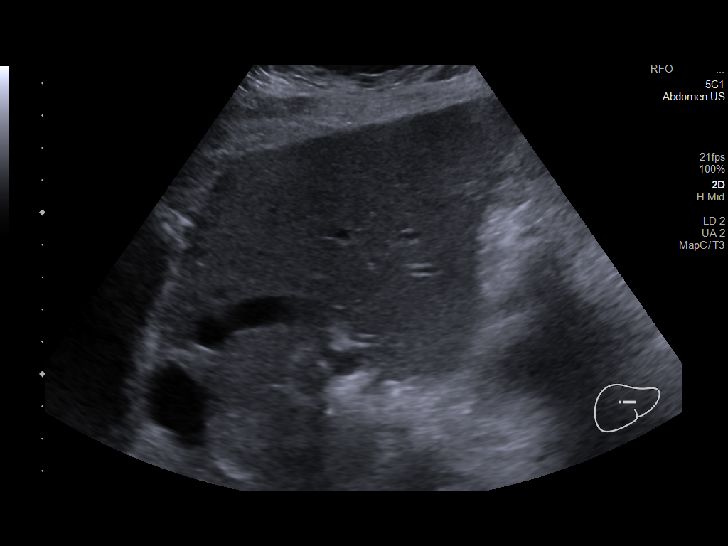
[im 50/92]
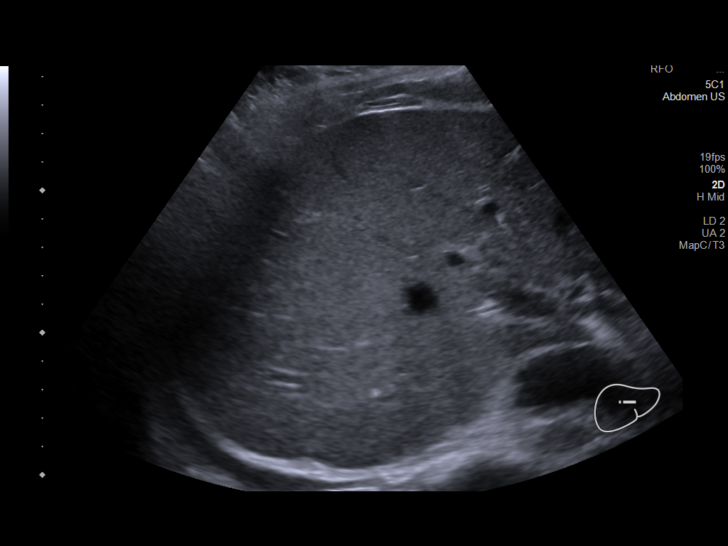
[im 57/92]
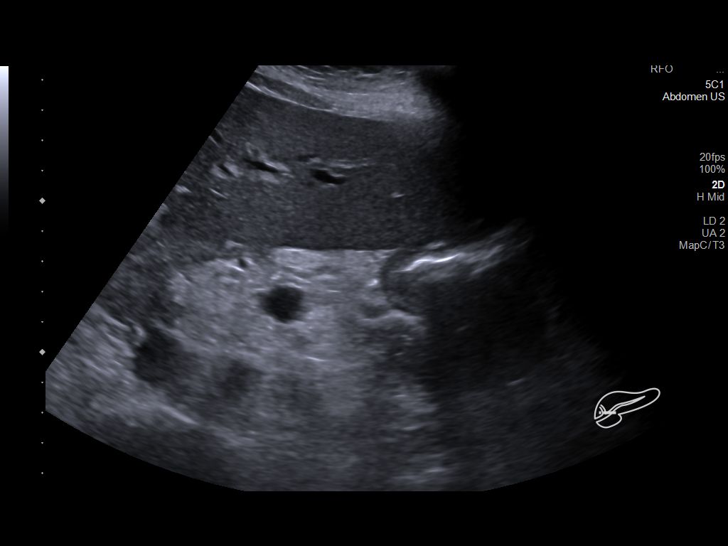
[im 61/92]
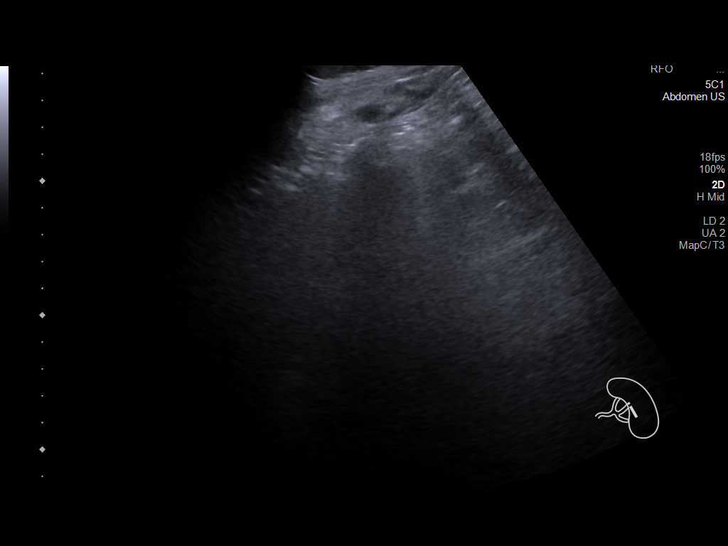
[im 69/92]
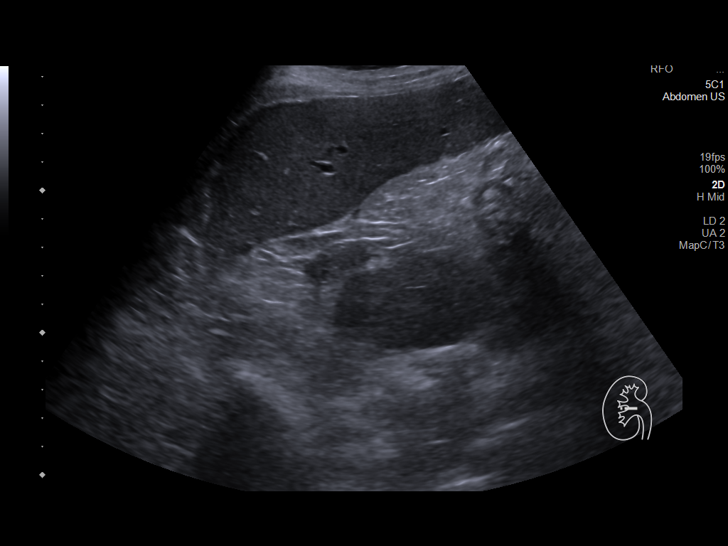
[im 76/92]
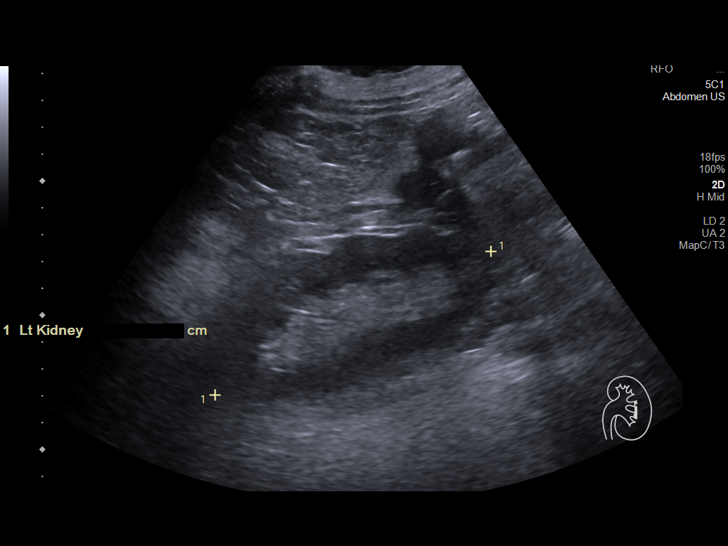
[im 84/92]
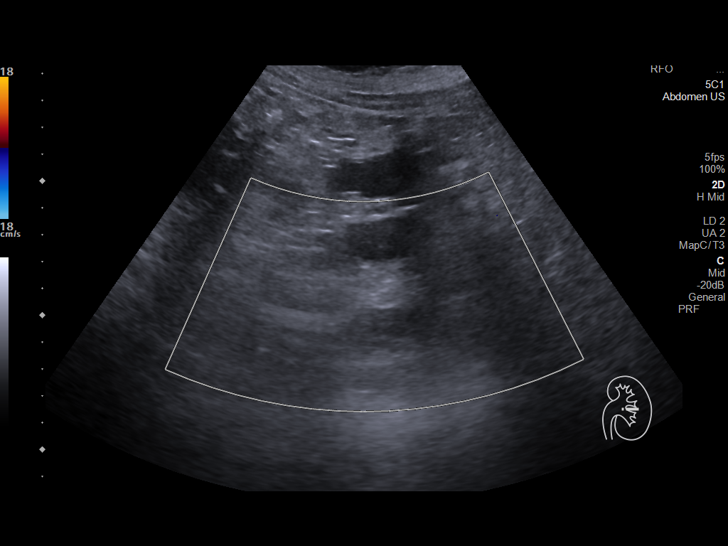
[im 92/92]
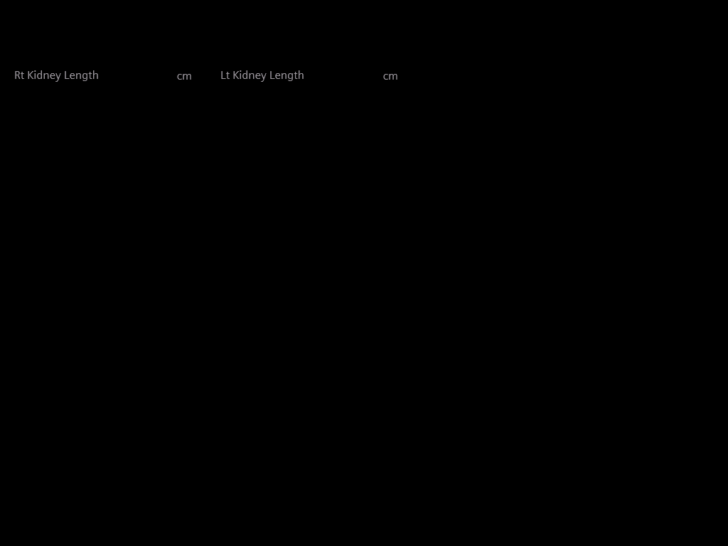

[14 of 25 positions shown; findings below may reference images not displayed]

FINDINGS: Gallbladder: Surgically removed

Common bile duct: Diameter: 0.8 cm

Liver: No focal lesion identified. Within normal limits in
parenchymal echogenicity. Portal vein is patent on color Doppler
imaging with normal direction of blood flow towards the liver.

IVC: No abnormality visualized.

Pancreas: Visualized portion unremarkable.

Spleen: Not visualized.

Right Kidney: Length: 11.3 cm. Echogenicity within normal limits. No
mass or hydronephrosis visualized. Trace perinephric fluid.

Left Kidney: Length: 11.6 cm. Echogenicity within normal limits. No
mass or hydronephrosis visualized.

Abdominal aorta: Limited evaluation.

Other findings: None.
IMPRESSION: 1. Cholecystectomy. Common bile duct is prominent and likely
secondary to cholecystectomy.
2. Negative for hydronephrosis. Mild right perinephric edema.
Findings are similar to the recent CT.
3. Limited evaluation of the spleen and aorta. These areas were
identified on the recent CT.

## 2020-07-09 MED ORDER — METOPROLOL TARTRATE 5 MG/5ML IV SOLN
5.0000 mg | Freq: Once | INTRAVENOUS | Status: AC
  Administered 2020-07-09: 5 mg via INTRAVENOUS
  Filled 2020-07-09: qty 5

## 2020-07-09 MED ORDER — HYDRALAZINE HCL 20 MG/ML IJ SOLN
10.0000 mg | Freq: Four times a day (QID) | INTRAMUSCULAR | Status: DC | PRN
  Administered 2020-07-09: 10 mg via INTRAVENOUS
  Filled 2020-07-09: qty 1

## 2020-07-09 MED ORDER — ASPIRIN EC 81 MG PO TBEC
81.0000 mg | DELAYED_RELEASE_TABLET | Freq: Every day | ORAL | Status: DC
  Administered 2020-07-10: 81 mg via ORAL
  Filled 2020-07-09: qty 1

## 2020-07-09 MED ORDER — ISOSORBIDE MONONITRATE ER 60 MG PO TB24
60.0000 mg | ORAL_TABLET | Freq: Every day | ORAL | Status: DC
  Administered 2020-07-10: 60 mg via ORAL
  Filled 2020-07-09: qty 1

## 2020-07-09 MED ORDER — CITALOPRAM HYDROBROMIDE 20 MG PO TABS
10.0000 mg | ORAL_TABLET | Freq: Every day | ORAL | Status: DC
  Administered 2020-07-10: 10 mg via ORAL
  Filled 2020-07-09: qty 1

## 2020-07-09 MED ORDER — ACETAMINOPHEN 325 MG PO TABS
650.0000 mg | ORAL_TABLET | ORAL | Status: DC | PRN
  Administered 2020-07-09: 650 mg via ORAL
  Filled 2020-07-09: qty 2

## 2020-07-09 MED ORDER — HYDROMORPHONE HCL 1 MG/ML IJ SOLN
1.0000 mg | Freq: Once | INTRAMUSCULAR | Status: AC
  Administered 2020-07-09: 1 mg via INTRAVENOUS
  Filled 2020-07-09: qty 1

## 2020-07-09 MED ORDER — HYDRALAZINE HCL 25 MG PO TABS
25.0000 mg | ORAL_TABLET | Freq: Three times a day (TID) | ORAL | Status: DC
  Administered 2020-07-09 – 2020-07-10 (×2): 25 mg via ORAL
  Filled 2020-07-09 (×2): qty 1

## 2020-07-09 MED ORDER — SODIUM CHLORIDE 0.9 % IV SOLN: 250.0000 mL | INTRAVENOUS | Status: DC | PRN

## 2020-07-09 MED ORDER — IOHEXOL 300 MG/ML  SOLN
100.0000 mL | Freq: Once | INTRAMUSCULAR | Status: AC | PRN
  Administered 2020-07-09: 100 mL via INTRAVENOUS

## 2020-07-09 MED ORDER — GABAPENTIN 100 MG PO CAPS
100.0000 mg | ORAL_CAPSULE | Freq: Three times a day (TID) | ORAL | Status: DC
  Administered 2020-07-09 – 2020-07-10 (×3): 100 mg via ORAL
  Filled 2020-07-09 (×3): qty 1

## 2020-07-09 MED ORDER — INSULIN ASPART 100 UNIT/ML ~~LOC~~ SOLN: 0.0000 [IU] | Freq: Three times a day (TID) | SUBCUTANEOUS | Status: DC

## 2020-07-09 MED ORDER — FUROSEMIDE 10 MG/ML IJ SOLN
40.0000 mg | Freq: Every day | INTRAMUSCULAR | Status: DC
  Administered 2020-07-10: 40 mg via INTRAVENOUS
  Filled 2020-07-09: qty 4

## 2020-07-09 MED ORDER — PANTOPRAZOLE SODIUM 40 MG PO TBEC
40.0000 mg | DELAYED_RELEASE_TABLET | Freq: Every day | ORAL | Status: DC
  Administered 2020-07-10: 40 mg via ORAL
  Filled 2020-07-09: qty 1

## 2020-07-09 MED ORDER — ONDANSETRON HCL 4 MG/2ML IJ SOLN: 4.0000 mg | Freq: Four times a day (QID) | INTRAMUSCULAR | Status: DC | PRN

## 2020-07-09 MED ORDER — ONDANSETRON HCL 4 MG/2ML IJ SOLN
4.0000 mg | Freq: Once | INTRAMUSCULAR | Status: AC
  Administered 2020-07-09: 4 mg via INTRAVENOUS
  Filled 2020-07-09: qty 2

## 2020-07-09 MED ORDER — CALCIUM CARBONATE ANTACID 500 MG PO CHEW: 2.0000 | CHEWABLE_TABLET | Freq: Three times a day (TID) | ORAL | Status: DC | PRN

## 2020-07-09 MED ORDER — ROSUVASTATIN CALCIUM 20 MG PO TABS
20.0000 mg | ORAL_TABLET | Freq: Every day | ORAL | Status: DC
  Administered 2020-07-10: 20 mg via ORAL
  Filled 2020-07-09: qty 1

## 2020-07-09 MED ORDER — SODIUM CHLORIDE 0.9 % IV BOLUS
1000.0000 mL | Freq: Once | INTRAVENOUS | Status: AC
  Administered 2020-07-09: 1000 mL via INTRAVENOUS

## 2020-07-09 MED ORDER — INSULIN ASPART 100 UNIT/ML ~~LOC~~ SOLN: 0.0000 [IU] | Freq: Every day | SUBCUTANEOUS | Status: DC

## 2020-07-09 MED ORDER — TICAGRELOR 90 MG PO TABS
45.0000 mg | ORAL_TABLET | Freq: Two times a day (BID) | ORAL | Status: DC
  Administered 2020-07-09 – 2020-07-10 (×2): 45 mg via ORAL
  Filled 2020-07-09 (×2): qty 1

## 2020-07-09 MED ORDER — PANTOPRAZOLE SODIUM 40 MG IV SOLR
40.0000 mg | Freq: Once | INTRAVENOUS | Status: AC
  Administered 2020-07-09: 40 mg via INTRAVENOUS
  Filled 2020-07-09: qty 40

## 2020-07-09 MED ORDER — SODIUM CHLORIDE 0.9 % IV SOLN
1.0000 g | INTRAVENOUS | Status: DC
  Administered 2020-07-09: 1 g via INTRAVENOUS
  Filled 2020-07-09: qty 10

## 2020-07-09 MED ORDER — CARVEDILOL 12.5 MG PO TABS
25.0000 mg | ORAL_TABLET | Freq: Two times a day (BID) | ORAL | Status: DC
  Administered 2020-07-09 – 2020-07-10 (×2): 25 mg via ORAL
  Filled 2020-07-09 (×2): qty 2

## 2020-07-09 MED ORDER — SODIUM CHLORIDE 0.9% FLUSH
3.0000 mL | Freq: Two times a day (BID) | INTRAVENOUS | Status: DC
  Administered 2020-07-09 – 2020-07-10 (×2): 3 mL via INTRAVENOUS

## 2020-07-09 MED ORDER — SODIUM CHLORIDE 0.9% FLUSH: 3.0000 mL | INTRAVENOUS | Status: DC | PRN

## 2020-07-09 MED ORDER — IOHEXOL 350 MG/ML SOLN
60.0000 mL | Freq: Once | INTRAVENOUS | Status: AC | PRN
  Administered 2020-07-09: 60 mL via INTRAVENOUS

## 2020-07-09 MED ORDER — HEPARIN SODIUM (PORCINE) 5000 UNIT/ML IJ SOLN
5000.0000 [IU] | Freq: Three times a day (TID) | INTRAMUSCULAR | Status: DC
  Administered 2020-07-09 – 2020-07-10 (×2): 5000 [IU] via SUBCUTANEOUS
  Filled 2020-07-09 (×2): qty 1

## 2020-07-09 NOTE — ED Notes (Signed)
Sister updated on status.  

## 2020-07-09 NOTE — H&P (Signed)
History and Physical  Robin [name] GUY:[id number] DOB: [birth year] DOA: 07/09/2020   PCP: Inda Coke, PA   Patient coming from: Home  Chief Complaint: sob, epigastric pain  HPI:  Robin [name] is a 54 y.o. female with medical history of cocaine abuse, diabetes mellitus type 2, persistent atrial fibrillation, hypertension, systolic and diastolic CHF, COPD, left MCA stroke with right hemiparesis, hyperlipidemia, depression/anxiety presenting with epigastric pain and shortness of breath.  The patient is a difficult historian secondary to her dysarthria and dysphasia.  Nevertheless, the patient states that she has had shortness of breath for the last 2 to 3 days.  She states that she has not been taking all of her cardiac medications.  She states that she has run out of some of her medications and has been without some meds for about a week.  She is unable to tell me which ones exactly that she has been not taking.  She denies any fevers, chills, chest pain, hemoptysis, vomiting, diarrhea, dysuria, hematuria, hematochezia, melena.  She has a nonproductive cough.  She does endorse some orthopnea type symptoms as well as weight gain over the past week. In the emergency department, the patient was afebrile hemodynamically stable.  Initial heart rates were 120-130.  The patient was given Lopressor 5 mg IV x1 with improvement of her heart rates into the 100-110 range.  CTA chest was negative for pulmonary embolus but showed a 4.1 cm ascending thoracic aneurysm that is unchanged.  There is pericardial effusion and mild pulmonary edema.  CT of the abdomen and pelvis showed a small pericardial effusion with small bilateral pleural effusions.  Otherwise the small and large bowel were unremarkable.  Right upper quadrant ultrasound was negative for hydronephrosis but showed a prominent CBD status post cholecystectomy. BMP was essentially unremarkable with a serum creatinine of 1.65 which is near the  patient's baseline.  LFTs were unremarkable.  WBC 5.6, hemoglobin 9.6, platelets 239,000.  Patient was given Lopressor 5 mg IV x1.  Assessment/Plan: Acute on chronic combined CHF -Start IV furosemide -Daily weights -Accurate I's and O's  Atrial fibrillation with RVR -Continue Lopressor 5 mg IV every 6 hours -TSH -Urine drug screen negative  Pericardial effusion -Repeat limited echocardiogram to reassess for hemodynamic compromise -CTA chest negative for PE  CKD stage IIIb -Baseline creatinine 1.4-1.7 -Monitor with diuresis  Diabetes mellitus type 2 -Start reduced dose Lantus -Hemoglobin A1c -NovoLog sliding scale  Hypertensive urgency -Restart carvedilol -Restart hydralazine  Pyuria -UA 11-20 WBC -Start ceftriaxone pending culture data  Left MCA infarct -Residual right deficits -PT evaluation  Hyperlipidemia -Continue  Chronic abdominal pain -07/09/2020 CT abdomen pelvis--no acute intra-abdominal findings -Lipase 28      Past Medical History:  Diagnosis Date  . A-fib ([place])   . Allergy   . Anemia   . Arthritis   . CHF (congestive heart failure) ([place])    a. EF at 30-35% by echo in 08/2018 b. EF at 35% by repeat echo in 05/2019  . Chronic abdominal pain   . Chronic atrial fibrillation ([place])   . Cocaine abuse ([place])   . COPD (chronic obstructive pulmonary disease) ([place])   . Essential hypertension, benign   . Expressive aphasia   . Expressive aphasia    post CVA  . Fatty liver   . GERD (gastroesophageal reflux disease)   . Gout 2016  . Normal coronary arteries    3/10 - following abnormal Myoview  . Ovarian cyst   .  Stroke [facility]) 12/26/2019   Right sided weakness, and expressive aphasia  . Thoracic ascending aortic aneurysm ([facility])    4 cm 10/31/19 CTA  . Type 2 diabetes mellitus ([place])    type II   Past Surgical History:  Procedure Laterality Date  . ABDOMINAL HYSTERECTOMY  09/10/2011   Procedure: HYSTERECTOMY ABDOMINAL;  Surgeon: Jonnie Kind,  MD;  Location: [facility];  Service: Gynecology;  Laterality: N/A;  Abdominal hysterectomy  . CESAREAN SECTION  [id number], and 1994  . CHOLECYSTECTOMY  1995  . IR 3D INDEPENDENT WKST  03/16/2020  . IR ANGIO INTRA EXTRACRAN SEL INTERNAL CAROTID BILAT MOD SED  03/16/2020  . IR ANGIO VERTEBRAL SEL SUBCLAVIAN INNOMINATE UNI L MOD SED  03/16/2020  . IR ANGIO VERTEBRAL SEL VERTEBRAL UNI R MOD SED  03/16/2020  . IR CT HEAD LTD  12/27/2019  . IR PERCUTANEOUS ART THROMBECTOMY/INFUSION INTRACRANIAL INC DIAG ANGIO  12/27/2019  . IR US GUIDE VASC ACCESS RIGHT  12/27/2019  . IR US GUIDE VASC ACCESS RIGHT  03/16/2020  . RADIOLOGY WITH ANESTHESIA N/A 12/27/2019   Procedure: IR WITH ANESTHESIA;  Surgeon: Luanne Bras, MD;  Location: [place];  Service: Radiology;  Laterality: N/A;  . SCAR REVISION  09/10/2011   Procedure: SCAR REVISION;  Surgeon: Jonnie Kind, MD;  Location: [facility];  Service: Gynecology;  Laterality: N/A;  Wide Excision of old Cicatrix  . TUBAL LIGATION  1994   Social History:  reports that she quit smoking about 7 months ago. Her smoking use included cigarettes. She quit after 29.00 years of use. She has never used smokeless tobacco. She reports previous alcohol use. She reports previous drug use. Drugs: Cocaine and Marijuana.   Family History  Problem Relation Age of Onset  . Cirrhosis Mother   . Early death Mother   . Alcohol abuse Mother   . Diabetes type II Father   . Alcohol abuse Father   . Diabetes type II Sister   . Early death Brother   . Alcohol abuse Son   . Anesthesia problems Neg Hx   . Hypotension Neg Hx   . Malignant hyperthermia Neg Hx   . Pseudochol deficiency Neg Hx   . Colon cancer Neg Hx   . Colon polyps Neg Hx   . Esophageal cancer Neg Hx   . Rectal cancer Neg Hx   . Stomach cancer Neg Hx      Allergies  Allergen Reactions  . Bee Venom Shortness Of Breath and Swelling    Bodily Swelling  . Losartan Other (See Comments)    Nosebleeds per patient report.    . Naproxen Other (See Comments)    Acid reflux  . Penicillins Nausea Only    Has patient had a PCN reaction causing immediate rash, facial/tongue/throat swelling, SOB or lightheadedness with hypotension: no Has patient had a PCN reaction causing severe rash involving mucus membranes or skin necrosis: no Has patient had a PCN reaction that required hospitalization no Has patient had a PCN reaction occurring within the last 10 years: no If all of the above answers are "NO", then may proceed with Cephalosporin   . Lisinopril     Sinus congestion     Prior to Admission medications   Medication Sig Start Date End Date Taking? Authorizing Provider  LANTUS SOLOSTAR 100 UNIT/ML Solostar Pen Inject 14-16 Units into the skin daily. If bs is over 250 give her 16 units 07/07/20  Yes [provider]  acetaminophen (TYLENOL) 325  MG tablet Take 650 mg by mouth every 8 (eight) hours as needed for mild pain.    [provider]  alum & mag hydroxide-simeth (MAALOX/MYLANTA) 200-200-20 MG/5ML suspension Take 30 mLs by mouth every 6 (six) hours as needed for indigestion or heartburn. Patient not taking: Reported on 07/05/2020 05/16/20   Heath Lark D, DO  aspirin EC 81 MG tablet Take 81 mg by mouth daily.  Patient not taking: Reported on 07/05/2020    [provider]  BD PEN NEEDLE NANO U/F 32G X 4 MM MISC Inject into the skin as directed. 02/01/20   [provider]  carvedilol (COREG) 25 MG tablet Take 1 tablet (25 mg total) by mouth 2 (two) times daily with a meal. Hold for HR<70, SBP<100 05/16/20   Manuella Ghazi, Pratik D, DO  citalopram (CELEXA) 10 MG tablet Take 1 tablet by mouth once daily 06/12/20   Inda Coke, PA  feeding supplement, ENSURE ENLIVE, (ENSURE ENLIVE) LIQD Take 237 mLs by mouth 2 (two) times daily between meals. Patient not taking: Reported on 07/05/2020 05/16/20   Heath Lark D, DO  gabapentin (NEURONTIN) 100 MG capsule Take 1 capsule (100 mg total) by mouth  3 (three) times daily. 02/16/20   Inda Coke, PA  hydrALAZINE (APRESOLINE) 25 MG tablet Take 1 tablet (25 mg total) by mouth 3 (three) times daily. 02/29/20 02/28/21  Inda Coke, PA  insulin glargine (LANTUS) 100 UNIT/ML injection Inject 10 Units into the skin at bedtime.    [provider]  isosorbide mononitrate (IMDUR) 30 MG 24 hr tablet Take 2 tablets (60 mg total) by mouth daily. 02/29/20 02/28/21  Inda Coke, PA  nitroGLYCERIN (NITROSTAT) 0.4 MG SL tablet Place 1 tablet (0.4 mg total) under the tongue every 5 (five) minutes as needed for chest pain. Patient not taking: Reported on 07/05/2020 02/01/20   Angiulli, Lavon Paganini, PA-C  nystatin ointment (MYCOSTATIN) Apply to affected area 1-2 times daily Patient not taking: Reported on 07/05/2020 02/16/20   Inda Coke, PA  pantoprazole (PROTONIX) 40 MG tablet Take 1 tablet (40 mg total) by mouth daily. 07/05/20 08/04/20  Inda Coke, PA  rosuvastatin (CRESTOR) 20 MG tablet Take 1 tablet by mouth once daily 06/12/20   Inda Coke, PA  ticagrelor (BRILINTA) 90 MG TABS tablet Take 45 mg by mouth 2 (two) times daily.    [provider]    Review of Systems:  Constitutional:  No weight loss, night sweats, Head&Eyes: No headache.  No vision loss.  No eye pain or scotoma ENT:  No Difficulty swallowing,Tooth/dental problems,Sore throat,  No ear ache, post nasal drip,  Cardio-vascular:  No chest pain, PND, swelling in lower extremities,  dizziness, palpitations  GI:  No  abdominal pain, nausea, vomiting, diarrhea, loss of appetite, hematochezia, melena, heartburn, indigestion, Resp:  No cough. No coughing up of blood .No wheezing.No chest wall deformity  Skin:  no rash or lesions.  GU:  no dysuria, change in color of urine, no urgency or frequency. No flank pain.  Musculoskeletal:  No joint pain or swelling. No decreased range of motion. No back pain.  Psych:  No change in mood or affect. No depression  or anxiety. Neurologic: No headache, no dysesthesia, no focal weakness, no vision loss. No syncope  Physical Exam: Vitals:   07/09/20 1330 07/09/20 1400 07/09/20 1430 07/09/20 1450  BP: (!) 176/127 (!) 178/129 (!) 178/128   Pulse: (!) 113 (!) 125 (!) 114 (!) 120  Resp: (!) 25 (!) 25 16 (!)  22  Temp:      TempSrc:      SpO2: 99% 93% 99% (!) 86%  Weight:      Height:       General:  A&O x 3, NAD, nontoxic, pleasant/cooperative Head/Eye: No conjunctival hemorrhage, no icterus, [place]/AT, No nystagmus ENT:  No icterus,  No thrush, good dentition, no pharyngeal exudate Neck:  No masses, no lymphadenpathy, no bruits CV:  IRRR, no rub, no gallop, no S3 Lung:  Bibasilar rales. No wheeze Abdomen: soft/NT, +BS, nondistended, no peritoneal signs Ext: No cyanosis, No rashes, No petechiae, No lymphangitis, No edema Neuro: CNII-XII intact, strength 4/5 in bilateral upper and lower extremities, no dysmetria  Labs on Admission:  Basic Metabolic Panel: Recent Labs  Lab 07/05/20 1359 07/09/20 0631  NA 140 136  K 4.2 3.9  CL 110 107  CO2 24 22  GLUCOSE 147* 166*  BUN 19 20  CREATININE 1.76* 1.65*  CALCIUM 9.0 9.1   Liver Function Tests: Recent Labs  Lab 07/05/20 1359 07/09/20 0631  AST 10 14*  ALT 7 13  ALKPHOS  --  81  BILITOT 0.5 0.9  PROT 6.3 7.4  ALBUMIN  --  4.0   Recent Labs  Lab 07/09/20 0631  LIPASE 28   No results for input(s): AMMONIA in the last 168 hours. CBC: Recent Labs  Lab 07/05/20 1359 07/09/20 0631  WBC 5.7 5.6  NEUTROABS 2,918 3.5  HGB 8.7* 9.6*  HCT 27.6* 30.9*  MCV 88.5 88.8  PLT 241 239   Coagulation Profile: No results for input(s): INR, PROTIME in the last 168 hours. Cardiac Enzymes: No results for input(s): CKTOTAL, CKMB, CKMBINDEX, TROPONINI in the last 168 hours. BNP: Invalid input(s): POCBNP CBG: No results for input(s): GLUCAP in the last 168 hours. Urine analysis:    Component Value Date/Time   COLORURINE STRAW (A) 07/09/2020  0953   APPEARANCEUR CLEAR 07/09/2020 0953   LABSPEC 1.027 07/09/2020 0953   PHURINE 7.0 07/09/2020 0953   GLUCOSEU 50 (A) 07/09/2020 0953   GLUCOSEU >=1000 (A) 09/21/2018 0829   HGBUR NEGATIVE 07/09/2020 0953   BILIRUBINUR NEGATIVE 07/09/2020 0953   KETONESUR NEGATIVE 07/09/2020 0953   PROTEINUR NEGATIVE 07/09/2020 0953   UROBILINOGEN 0.2 09/21/2018 0829   NITRITE NEGATIVE 07/09/2020 0953   LEUKOCYTESUR TRACE (A) 07/09/2020 0953   Sepsis Labs: @LABRCNTIP (procalcitonin:4,lacticidven:4) ) Recent Results (from the past 240 hour(s))  Resp Panel by RT-PCR (Flu A&B, Covid) Nasopharyngeal Swab     Status: None   Collection Time: 07/09/20 12:08 PM   Specimen: Nasopharyngeal Swab; Nasopharyngeal(NP) swabs in vial transport medium  Result Value Ref Range Status   SARS Coronavirus 2 by RT PCR NEGATIVE NEGATIVE Final    Comment: (NOTE) SARS-CoV-2 target nucleic acids are NOT DETECTED.  The SARS-CoV-2 RNA is generally detectable in upper respiratory specimens during the acute phase of infection. The lowest concentration of SARS-CoV-2 viral copies this assay can detect is 138 copies/mL. A negative result does not preclude SARS-Cov-2 infection and should not be used as the sole basis for treatment or other patient management decisions. A negative result may occur with  improper specimen collection/handling, submission of specimen other than nasopharyngeal swab, presence of viral mutation(s) within the areas targeted by this assay, and inadequate number of viral copies(<138 copies/mL). A negative result must be combined with clinical observations, patient history, and epidemiological information. The expected result is Negative.  Fact Sheet for Patients:  EntrepreneurPulse.com.au  Fact Sheet for Healthcare Providers:  IncredibleEmployment.be  This  test is no t yet approved or cleared by the [place] and  has been authorized for detection  and/or diagnosis of SARS-CoV-2 by FDA under an Emergency Use Authorization (EUA). This EUA will remain  in effect (meaning this test can be used) for the duration of the COVID-19 declaration under Section 564(b)(1) of the Act, 21 U.S.C.section 360bbb-3(b)(1), unless the authorization is terminated  or revoked sooner.       Influenza A by PCR NEGATIVE NEGATIVE Final   Influenza B by PCR NEGATIVE NEGATIVE Final    Comment: (NOTE) The Xpert Xpress SARS-CoV-2/FLU/RSV plus assay is intended as an aid in the diagnosis of influenza from Nasopharyngeal swab specimens and should not be used as a sole basis for treatment. Nasal washings and aspirates are unacceptable for Xpert Xpress SARS-CoV-2/FLU/RSV testing.  Fact Sheet for Patients: EntrepreneurPulse.com.au  Fact Sheet for Healthcare Providers: IncredibleEmployment.be  This test is not yet approved or cleared by the [place] FDA and has been authorized for detection and/or diagnosis of SARS-CoV-2 by FDA under an Emergency Use Authorization (EUA). This EUA will remain in effect (meaning this test can be used) for the duration of the COVID-19 declaration under Section 564(b)(1) of the Act, 21 U.S.C. section 360bbb-3(b)(1), unless the authorization is terminated or revoked.  Performed at [facility], [address], [place], [place] [address]      Radiological Exams on Admission: CT Angio Chest PE W and/or Wo Contrast  Result Date: 07/09/2020 CLINICAL DATA:  Shortness of breath, effusion, and pleurisy. EXAM: CT ANGIOGRAPHY CHEST WITH CONTRAST TECHNIQUE: Multidetector CT imaging of the chest was performed using the standard protocol during bolus administration of intravenous contrast. Multiplanar CT image reconstructions and MIPs were obtained to evaluate the vascular anatomy. CONTRAST:  42mL OMNIPAQUE IOHEXOL 350 MG/ML SOLN COMPARISON:  October 31, 2019 FINDINGS: Cardiovascular: There is a 4.1 cm  ascending thoracic aortic aneurysm, unchanged by my measurement in the interval. No dissection. No significant atherosclerotic change. Cardiomegaly identified. No definite coronary artery atherosclerotic change. No pulmonary emboli. Mediastinum/Nodes: Small bilateral pleural effusions are identified. There is also a pericardial effusion measuring up to 1.5 cm posteriorly on series 4, image 69. The esophagus is normal. There are numerous shotty nodes in the mediastinum which are more prominent compared to March of 2021. The thyroid is unremarkable. Lungs/Pleura: Central airways are normal. No pneumothorax. Interlobular septal thickening consistent with mild edema. Atelectasis is associated with the effusions. No suspicious infiltrates to suggest pneumonia. Upper Abdomen: No acute abnormality. Musculoskeletal: There is a sclerotic lesion in a thoracic vertebral body on series 8, image 99, unchanged since September 2020 and January of 2020, of doubtful significance. Review of the MIP images confirms the above findings. IMPRESSION: 1. No pulmonary emboli. 2. 4.1 cm ascending thoracic aortic aneurysm, unchanged by my measurement. Recommend annual imaging followup by CTA or MRA. This recommendation follows 2010 ACCF/AHA/AATS/ACR/ASA/SCA/SCAI/SIR/STS/SVM Guidelines for the Diagnosis and Management of Patients with Thoracic Aortic Disease. Circulation. 2010; 121: O671-I458. Aortic aneurysm NOS (ICD10-I71.9) 3. Cardiomegaly, small bilateral pleural effusions, and mild pulmonary edema. 4. Pericardial effusion measuring up to 1.5 cm in thickness. 5. Shotty nodes in the mediastinum are likely reactive. Recommend attention on follow-up. Aortic aneurysm NOS (ICD10-I71.9). Electronically Signed   By: Dorise Bullion III M.D   On: 07/09/2020 14:01   US Abdomen Complete  Result Date: 07/09/2020 CLINICAL DATA:  54 year old with abdominal pain. EXAM: ABDOMEN ULTRASOUND COMPLETE COMPARISON:  CT abdomen and pelvis 07/09/2020  FINDINGS: Gallbladder: Surgically removed Common  bile duct: Diameter: 0.8 cm Liver: No focal lesion identified. Within normal limits in parenchymal echogenicity. Portal vein is patent on color Doppler imaging with normal direction of blood flow towards the liver. IVC: No abnormality visualized. Pancreas: Visualized portion unremarkable. Spleen: Not visualized. Right Kidney: Length: 11.3 cm. Echogenicity within normal limits. No mass or hydronephrosis visualized. Trace perinephric fluid. Left Kidney: Length: 11.6 cm. Echogenicity within normal limits. No mass or hydronephrosis visualized. Abdominal aorta: Limited evaluation. Other findings: None. IMPRESSION: 1. Cholecystectomy. Common bile duct is prominent and likely secondary to cholecystectomy. 2. Negative for hydronephrosis. Mild right perinephric edema. Findings are similar to the recent CT. 3. Limited evaluation of the spleen and aorta. These areas were identified on the recent CT. Electronically Signed   By: Markus Daft M.D.   On: 07/09/2020 09:52   CT ABDOMEN PELVIS W CONTRAST  Result Date: 07/09/2020 CLINICAL DATA:  Lower abdominal pain, possible hernia EXAM: CT ABDOMEN AND PELVIS WITH CONTRAST TECHNIQUE: Multidetector CT imaging of the abdomen and pelvis was performed using the standard protocol following bolus administration of intravenous contrast. CONTRAST:  170mL OMNIPAQUE IOHEXOL 300 MG/ML  SOLN COMPARISON:  04/19/2019 FINDINGS: Lower chest: Small effusions are noted bilaterally. Mild dependent atelectatic changes are seen. No focal confluent infiltrate is noted. Small pericardial effusion is noted as well. Hepatobiliary: Gallbladder is been surgically removed. Fatty infiltration of the liver is noted. Pancreas: Unremarkable. No pancreatic ductal dilatation or surrounding inflammatory changes. Spleen: Normal in size without focal abnormality. Adrenals/Urinary Tract: The adrenal glands are within normal limits. Kidneys demonstrate a normal  enhancement pattern bilaterally. Normal excretion is noted on delayed images. Bladder is partially distended Stomach/Bowel: The appendix is well visualized and within normal limits. No obstructive or inflammatory changes of the colon are seen. Stomach and small bowel limits. Vascular/Lymphatic: Aortic atherosclerosis. No enlarged abdominal or pelvic lymph nodes. Multiple ovarian vein phleboliths are noted. Reproductive: Status post hysterectomy. No adnexal masses. Other: No abdominal wall hernia or abnormality. No abdominopelvic ascites. Musculoskeletal: Degenerative changes of the lumbar spine are noted. IMPRESSION: Small bilateral effusions with bibasilar atelectatic changes. Small pericardial effusion new from the prior study. No other focal abnormality is noted. Electronically Signed   By: Inez Catalina M.D.   On: 07/09/2020 08:49    EKG: Independently reviewed. afib RVR, nonspecific TWI    Time spent:60 minutes Code Status:   FULL Family Communication:  No Family at bedside Disposition Plan: expect 2 day hospitalization Consults called: none DVT Prophylaxis:  Heparin  Orson Eva, DO  [facility] Pager [phone]  If 7PM-7AM, please contact night-coverage [email or web address] Password [facility] 07/09/2020, 3:27 PM
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
## 2020-07-09 NOTE — ED Notes (Signed)
Handoff to Linda RN

## 2020-07-09 NOTE — ED Notes (Signed)
hospitalist in to eval

## 2020-07-09 NOTE — ED Notes (Signed)
Assumed care of pt at 0700

## 2020-07-09 NOTE — ED Notes (Signed)
Pt vomitted 174ml green vomit

## 2020-07-09 NOTE — ED Triage Notes (Signed)
Pt c/o left lower abd pain since yesterday. Denies any other symptoms.

## 2020-07-09 NOTE — ED Notes (Signed)
Attempted to give pt food and she said the smell made her nauseous.

## 2020-07-09 NOTE — Plan of Care (Signed)

## 2020-07-10 ENCOUNTER — Observation Stay (HOSPITAL_BASED_OUTPATIENT_CLINIC_OR_DEPARTMENT_OTHER): Payer: 59

## 2020-07-10 ENCOUNTER — Telehealth: Payer: Self-pay

## 2020-07-10 DIAGNOSIS — I5043 Acute on chronic combined systolic (congestive) and diastolic (congestive) heart failure: Secondary | ICD-10-CM | POA: Diagnosis not present

## 2020-07-10 DIAGNOSIS — I313 Pericardial effusion (noninflammatory): Secondary | ICD-10-CM | POA: Diagnosis not present

## 2020-07-10 LAB — ECHOCARDIOGRAM LIMITED
Area-P 1/2: 3.05 cm2
Height: 67 in
S' Lateral: 3.26 cm
Weight: 3121.71 oz

## 2020-07-10 LAB — HEMOGLOBIN A1C
Hgb A1c MFr Bld: 7.2 % — ABNORMAL HIGH (ref 4.8–5.6)
Mean Plasma Glucose: 159.94 mg/dL

## 2020-07-10 LAB — GLUCOSE, CAPILLARY: Glucose-Capillary: 146 mg/dL — ABNORMAL HIGH (ref 70–99)

## 2020-07-10 LAB — MAGNESIUM: Magnesium: 1.8 mg/dL (ref 1.7–2.4)

## 2020-07-10 MED ORDER — FUROSEMIDE 20 MG PO TABS: 20.0000 mg | ORAL_TABLET | Freq: Every day | ORAL | 11 refills | Status: DC

## 2020-07-10 MED ORDER — CEPHALEXIN 500 MG PO CAPS: 500.0000 mg | ORAL_CAPSULE | Freq: Three times a day (TID) | ORAL | 0 refills | Status: AC

## 2020-07-10 MED ORDER — ALUM & MAG HYDROXIDE-SIMETH 200-200-20 MG/5ML PO SUSP
30.0000 mL | Freq: Once | ORAL | Status: AC
  Administered 2020-07-10: 30 mL via ORAL
  Filled 2020-07-10: qty 30

## 2020-07-10 NOTE — Progress Notes (Signed)
*  PRELIMINARY RESULTS* Echocardiogram 2D Echocardiogram has been performed.  Robin [name] 07/10/2020, 10:48 AM

## 2020-07-10 NOTE — Discharge Summary (Signed)
Physician Discharge Summary  Kayli Beal Alleyne YIF:[id number] DOB: [birth year] DOA: 07/09/2020  PCP: Inda Coke, PA  Admit date: 07/09/2020 Discharge date: 07/10/2020  Admitted From: Home  Disposition:  Home   Recommendations for Outpatient Follow-up:  1. Follow up with PCP in 1-2 weeks 2. Ms Morene Rankins: Please obtain BMP on new Lasix in 1 week 3. Ms Morene Rankins: Please follow up urine culture sensitivities 4. Follow up with Cardiology in 4-6 weeks      Home Health: None  Equipment/Devices: None  Discharge Condition: Good  CODE STATUS: FULL Diet recommendation: Cardiac, diabetic  Brief/Interim Summary: Robin [name] is a 54 y.o. F with hx systolic and diastolic CHF, DM, PAF, HTN, COPD, and left MCA stroke with residual right hemiparesis and dysarthria who presents with shortness of breath and chest tightness progressive for several days.  Evidently the patient had run out of her cardiac medicines recently.  Then over the last several days, she started to have dyspnea with exertion and orthopnea type symptoms as well as weight gain.  In the ER, chest imaging showed no pneumonia or PE, but did show pericardial effusion and mild pulmonary edema, and bilateral small pleural effusions.  Patient was admitted and started on Lasix.       PRINCIPAL HOSPITAL DIAGNOSIS: Acute on chronic systolic and diastolic CHF    Discharge Diagnoses:   Acute on chronic combined CHF The patient was admitted and started on IV furosemide.  She was treated overnight and the next day her breathing was better, felt at baseline.  She was discharged on new oral Lasix, with close PCP follow-up.    Atrial fibrillation with RVR Patient admitted with fast heart rate, controlled easily with IV Lopressor.  Continued on carvedilol at discharge.  Pericardial effusion This was small on echocardiogram.  No functional limitation.  CKD stage IIIb Stable relative to baseline.  Diabetes mellitus type  2  Hypertensive urgency Normalized with resumption of home medicines.  UTI Treated with ceftriaxone, discharged to complete 5 days of cephalosporin.  Cerebrovascular disease, secondary prevention Dysarthria Right hemiparesis Residual right deficits unchanged.  Chronic abdominal pain 07/09/2020 CT abdomen pelvis--no acute intra-abdominal findings          Discharge Instructions  Discharge Instructions    Diet - low sodium heart healthy   Complete by: As directed    Discharge instructions   Complete by: As directed    From Dr. Loleta Books and Dr. Carles Collet, You were admitted for trouble breathing due to congestive heart failure flaring up. This is common Your case is mild  Start taking furosemide/Lasix 20 mg once daily before lunch THis is a diuretic, it will make you pee more, for about 6 hours  Buy a home scale Weigh yourself EVERY DAY If your weight is ever going up by 5 lbs from your weight today, you need to call your primary care doctor immediately Go have your lab work (bloodwork) checked in 1 week by your primary care doctor Schedule a follow up appointment with Dr. Harl Bowie your heart doctor in 1 month  You also had a urinary tract infection (UTI, or bladder infection) Take the antibiotic cephalexin 500 mg three time daily for 3 more days.   Increase activity slowly   Complete by: As directed      Allergies as of 07/10/2020      Reactions   Bee Venom Shortness Of Breath, Swelling   Bodily Swelling   Losartan Other (See Comments)   Nosebleeds per patient report.  Naproxen Other (See Comments)   Acid reflux   Penicillins Nausea Only   Has patient had a PCN reaction causing immediate rash, facial/tongue/throat swelling, SOB or lightheadedness with hypotension: no Has patient had a PCN reaction causing severe rash involving mucus membranes or skin necrosis: no Has patient had a PCN reaction that required hospitalization no Has patient had a PCN reaction  occurring within the last 10 years: no If all of the above answers are "NO", then may proceed with Cephalosporin    Lisinopril    Sinus congestion      Medication List    TAKE these medications   acetaminophen 325 MG tablet Commonly known as: TYLENOL Take 650 mg by mouth every 8 (eight) hours as needed for mild pain.   alum & mag hydroxide-simeth 200-200-20 MG/5ML suspension Commonly known as: MAALOX/MYLANTA Take 30 mLs by mouth every 6 (six) hours as needed for indigestion or heartburn.   aspirin EC 81 MG tablet Take 81 mg by mouth daily.   BD Pen Needle Nano U/F 32G X 4 MM Misc Generic drug: Insulin Pen Needle Inject into the skin as directed.   carvedilol 25 MG tablet Commonly known as: Coreg Take 1 tablet (25 mg total) by mouth 2 (two) times daily with a meal. Hold for HR<70, SBP<100   cephALEXin 500 MG capsule Commonly known as: KEFLEX Take 1 capsule (500 mg total) by mouth 3 (three) times daily for 3 days.   citalopram 10 MG tablet Commonly known as: CELEXA Take 1 tablet by mouth once daily   feeding supplement Liqd Take 237 mLs by mouth 2 (two) times daily between meals.   furosemide 20 MG tablet Commonly known as: Lasix Take 1 tablet (20 mg total) by mouth daily.   gabapentin 100 MG capsule Commonly known as: NEURONTIN Take 1 capsule (100 mg total) by mouth 3 (three) times daily.   hydrALAZINE 25 MG tablet Commonly known as: APRESOLINE Take 1 tablet (25 mg total) by mouth 3 (three) times daily.   insulin glargine 100 UNIT/ML injection Commonly known as: LANTUS Inject 10 Units into the skin at bedtime.   Lantus SoloStar 100 UNIT/ML Solostar Pen Generic drug: insulin glargine Inject 14-16 Units into the skin daily. If bs is over 250 give her 16 units   isosorbide mononitrate 30 MG 24 hr tablet Commonly known as: IMDUR Take 2 tablets (60 mg total) by mouth daily.   nitroGLYCERIN 0.4 MG SL tablet Commonly known as: NITROSTAT Place 1 tablet (0.4 mg  total) under the tongue every 5 (five) minutes as needed for chest pain.   nystatin ointment Commonly known as: MYCOSTATIN Apply to affected area 1-2 times daily   pantoprazole 40 MG tablet Commonly known as: PROTONIX Take 1 tablet (40 mg total) by mouth daily.   rosuvastatin 20 MG tablet Commonly known as: CRESTOR Take 1 tablet by mouth once daily   ticagrelor 90 MG Tabs tablet Commonly known as: BRILINTA Take 45 mg by mouth 2 (two) times daily.       Follow-up Information    Inda Coke, [place]. Schedule an appointment as soon as possible for a visit in 1 week(s).   Specialty: Physician Assistant Contact information: [place] [address] [phone]        Arnoldo Lenis, MD. Schedule an appointment as soon as possible for a visit in 1 month(s).   Specialty: Cardiology Contact information: [address] [place] [address] [phone]  Allergies  Allergen Reactions  . Bee Venom Shortness Of Breath and Swelling    Bodily Swelling  . Losartan Other (See Comments)    Nosebleeds per patient report.   . Naproxen Other (See Comments)    Acid reflux  . Penicillins Nausea Only    Has patient had a PCN reaction causing immediate rash, facial/tongue/throat swelling, SOB or lightheadedness with hypotension: no Has patient had a PCN reaction causing severe rash involving mucus membranes or skin necrosis: no Has patient had a PCN reaction that required hospitalization no Has patient had a PCN reaction occurring within the last 10 years: no If all of the above answers are "NO", then may proceed with Cephalosporin   . Lisinopril     Sinus congestion       Procedures/Studies: CT Angio Chest PE W and/or Wo Contrast  Result Date: 07/09/2020 CLINICAL DATA:  Shortness of breath, effusion, and pleurisy. EXAM: CT ANGIOGRAPHY CHEST WITH CONTRAST TECHNIQUE: Multidetector CT imaging of the chest was performed using the standard  protocol during bolus administration of intravenous contrast. Multiplanar CT image reconstructions and MIPs were obtained to evaluate the vascular anatomy. CONTRAST:  74mL OMNIPAQUE IOHEXOL 350 MG/ML SOLN COMPARISON:  October 31, 2019 FINDINGS: Cardiovascular: There is a 4.1 cm ascending thoracic aortic aneurysm, unchanged by my measurement in the interval. No dissection. No significant atherosclerotic change. Cardiomegaly identified. No definite coronary artery atherosclerotic change. No pulmonary emboli. Mediastinum/Nodes: Small bilateral pleural effusions are identified. There is also a pericardial effusion measuring up to 1.5 cm posteriorly on series 4, image 69. The esophagus is normal. There are numerous shotty nodes in the mediastinum which are more prominent compared to March of 2021. The thyroid is unremarkable. Lungs/Pleura: Central airways are normal. No pneumothorax. Interlobular septal thickening consistent with mild edema. Atelectasis is associated with the effusions. No suspicious infiltrates to suggest pneumonia. Upper Abdomen: No acute abnormality. Musculoskeletal: There is a sclerotic lesion in a thoracic vertebral body on series 8, image 99, unchanged since September 2020 and January of 2020, of doubtful significance. Review of the MIP images confirms the above findings. IMPRESSION: 1. No pulmonary emboli. 2. 4.1 cm ascending thoracic aortic aneurysm, unchanged by my measurement. Recommend annual imaging followup by CTA or MRA. This recommendation follows 2010 ACCF/AHA/AATS/ACR/ASA/SCA/SCAI/SIR/STS/SVM Guidelines for the Diagnosis and Management of Patients with Thoracic Aortic Disease. Circulation. 2010; 121: W656-C127. Aortic aneurysm NOS (ICD10-I71.9) 3. Cardiomegaly, small bilateral pleural effusions, and mild pulmonary edema. 4. Pericardial effusion measuring up to 1.5 cm in thickness. 5. Shotty nodes in the mediastinum are likely reactive. Recommend attention on follow-up. Aortic aneurysm NOS  (ICD10-I71.9). Electronically Signed   By: Dorise Bullion III M.D   On: 07/09/2020 14:01   US Abdomen Complete  Result Date: 07/09/2020 CLINICAL DATA:  54 year old with abdominal pain. EXAM: ABDOMEN ULTRASOUND COMPLETE COMPARISON:  CT abdomen and pelvis 07/09/2020 FINDINGS: Gallbladder: Surgically removed Common bile duct: Diameter: 0.8 cm Liver: No focal lesion identified. Within normal limits in parenchymal echogenicity. Portal vein is patent on color Doppler imaging with normal direction of blood flow towards the liver. IVC: No abnormality visualized. Pancreas: Visualized portion unremarkable. Spleen: Not visualized. Right Kidney: Length: 11.3 cm. Echogenicity within normal limits. No mass or hydronephrosis visualized. Trace perinephric fluid. Left Kidney: Length: 11.6 cm. Echogenicity within normal limits. No mass or hydronephrosis visualized. Abdominal aorta: Limited evaluation. Other findings: None. IMPRESSION: 1. Cholecystectomy. Common bile duct is prominent and likely secondary to cholecystectomy. 2. Negative for hydronephrosis. Mild right perinephric edema. Findings are  similar to the recent CT. 3. Limited evaluation of the spleen and aorta. These areas were identified on the recent CT. Electronically Signed   By: Markus Daft M.D.   On: 07/09/2020 09:52   CT ABDOMEN PELVIS W CONTRAST  Result Date: 07/09/2020 CLINICAL DATA:  Lower abdominal pain, possible hernia EXAM: CT ABDOMEN AND PELVIS WITH CONTRAST TECHNIQUE: Multidetector CT imaging of the abdomen and pelvis was performed using the standard protocol following bolus administration of intravenous contrast. CONTRAST:  136mL OMNIPAQUE IOHEXOL 300 MG/ML  SOLN COMPARISON:  04/19/2019 FINDINGS: Lower chest: Small effusions are noted bilaterally. Mild dependent atelectatic changes are seen. No focal confluent infiltrate is noted. Small pericardial effusion is noted as well. Hepatobiliary: Gallbladder is been surgically removed. Fatty infiltration of  the liver is noted. Pancreas: Unremarkable. No pancreatic ductal dilatation or surrounding inflammatory changes. Spleen: Normal in size without focal abnormality. Adrenals/Urinary Tract: The adrenal glands are within normal limits. Kidneys demonstrate a normal enhancement pattern bilaterally. Normal excretion is noted on delayed images. Bladder is partially distended Stomach/Bowel: The appendix is well visualized and within normal limits. No obstructive or inflammatory changes of the colon are seen. Stomach and small bowel limits. Vascular/Lymphatic: Aortic atherosclerosis. No enlarged abdominal or pelvic lymph nodes. Multiple ovarian vein phleboliths are noted. Reproductive: Status post hysterectomy. No adnexal masses. Other: No abdominal wall hernia or abnormality. No abdominopelvic ascites. Musculoskeletal: Degenerative changes of the lumbar spine are noted. IMPRESSION: Small bilateral effusions with bibasilar atelectatic changes. Small pericardial effusion new from the prior study. No other focal abnormality is noted. Electronically Signed   By: Inez Catalina M.D.   On: 07/09/2020 08:49   ECHOCARDIOGRAM LIMITED  Result Date: 07/10/2020    ECHOCARDIOGRAM LIMITED REPORT   Patient Name:   Kamariya A Gasiorowski Date of Exam: 07/10/2020 Medical Rec #:  [id number]      Height:       67.0 in Accession #:    [id number]     Weight:       195.1 lb Date of Birth:  1966-04-05     BSA:          2.001 m Patient Age:    54 years       BP:           159/107 mmHg Patient Gender: F              HR:           108 bpm. Exam Location:  [name] Procedure: 2D Echo Indications:    Pericardial effusion 423.9 / I31.3  History:        Patient has prior history of Echocardiogram examinations, most                 recent 05/12/2020. COPD and Stroke, Arrythmias:Atrial                 Fibrillation; Risk Factors:Diabetes, Former Smoker and                 Hypertension. Thoracic Aorta Aneurysm, Cocaine Abuse, Pulmonary                 Edema.   Sonographer:    Leavy Cella RDCS (AE) Referring Phys: [phone] DAVID TAT IMPRESSIONS  1. Left ventricular ejection fraction, by estimation, is 50 to 55%. The left ventricle has low normal function.  2. A small pericardial effusion is present. The pericardial effusion is circumferential. There is no evidence of cardiac tamponade. Effusion appears slightly larger  compared to the 05/12/2020 study.  3. The inferior vena cava is normal in size with greater than 50% respiratory variability, suggesting right atrial pressure of 3 mmHg. FINDINGS  Left Ventricle: Left ventricular ejection fraction, by estimation, is 50 to 55%. The left ventricle has low normal function. Pericardium: A small pericardial effusion is present. The pericardial effusion is circumferential. There is no evidence of cardiac tamponade. Venous: The inferior vena cava is normal in size with greater than 50% respiratory variability, suggesting right atrial pressure of 3 mmHg. LEFT VENTRICLE PLAX 2D LVIDd:         3.90 cm  Diastology LVIDs:         3.26 cm  LV e' medial:    5.66 cm/s LV PW:         1.31 cm  LV E/e' medial:  22.4 LV IVS:        1.47 cm  LV e' lateral:   6.53 cm/s LVOT diam:     2.00 cm  LV E/e' lateral: 19.4 LVOT Area:     3.14 cm  LEFT ATRIUM           Index LA diam:      5.40 cm 2.70 cm/m LA Vol (A4C): 84.0 ml 41.98 ml/m   AORTA Ao Root diam: 2.90 cm MITRAL VALVE MV Area (PHT): 3.05 cm     SHUNTS MV Decel Time: 249 msec     Systemic Diam: 2.00 cm MV E velocity: 127.00 cm/s MV A velocity: 31.20 cm/s MV E/A ratio:  4.07 Carlyle Dolly MD Electronically signed by Carlyle Dolly MD Signature Date/Time: 07/10/2020/1:25:21 PM    Final       Subjective: Patient feeling well.  No orthopnea, no swelling, no dyspnea with exertion.  No chest pain.  No fever.  Discharge Exam: Vitals:   07/10/20 0523 07/10/20 1133  BP: (!) 159/107 131/90  Pulse: (!) 108 92  Resp: 16 18  Temp: 98.2 F (36.8 C) 98.3 F (36.8 C)  SpO2: 100% 100%    Vitals:   07/09/20 1646 07/09/20 2111 07/10/20 0523 07/10/20 1133  BP:  (!) 158/113 (!) 159/107 131/90  Pulse:  97 (!) 108 92  Resp:  18 16 18   Temp:  98.4 F (36.9 C) 98.2 F (36.8 C) 98.3 F (36.8 C)  TempSrc:  Oral Oral Oral  SpO2:  99% 100% 100%  Weight: 88.2 kg  88.5 kg   Height:        General: Pt is alert, awake, not in acute distress Cardiovascular: RRR, nl S1-S2, no murmurs appreciated.   No LE edema.   Respiratory: Normal respiratory rate and rhythm.  CTAB without rales or wheezes. Abdominal: Abdomen soft and non-tender.  No distension or HSM.   Neuro/Psych: Strength weak on right side.  Dysarthric.    Judgment and insight appear normal.   The results of significant diagnostics from this hospitalization (including imaging, microbiology, ancillary and laboratory) are listed below for reference.     Microbiology: Recent Results (from the past 240 hour(s))  Resp Panel by RT-PCR (Flu A&B, Covid) Nasopharyngeal Swab     Status: None   Collection Time: 07/09/20 12:08 PM   Specimen: Nasopharyngeal Swab; Nasopharyngeal(NP) swabs in vial transport medium  Result Value Ref Range Status   SARS Coronavirus 2 by RT PCR NEGATIVE NEGATIVE Final    Comment: (NOTE) SARS-CoV-2 target nucleic acids are NOT DETECTED.  The SARS-CoV-2 RNA is generally detectable in upper respiratory specimens during the acute phase of  infection. The lowest concentration of SARS-CoV-2 viral copies this assay can detect is 138 copies/mL. A negative result does not preclude SARS-Cov-2 infection and should not be used as the sole basis for treatment or other patient management decisions. A negative result may occur with  improper specimen collection/handling, submission of specimen other than nasopharyngeal swab, presence of viral mutation(s) within the areas targeted by this assay, and inadequate number of viral copies(<138 copies/mL). A negative result must be combined with clinical observations,  patient history, and epidemiological information. The expected result is Negative.  Fact Sheet for Patients:  EntrepreneurPulse.com.au  Fact Sheet for Healthcare Providers:  IncredibleEmployment.be  This test is no t yet approved or cleared by the [place] FDA and  has been authorized for detection and/or diagnosis of SARS-CoV-2 by FDA under an Emergency Use Authorization (EUA). This EUA will remain  in effect (meaning this test can be used) for the duration of the COVID-19 declaration under Section 564(b)(1) of the Act, 21 U.S.C.section 360bbb-3(b)(1), unless the authorization is terminated  or revoked sooner.       Influenza A by PCR NEGATIVE NEGATIVE Final   Influenza B by PCR NEGATIVE NEGATIVE Final    Comment: (NOTE) The Xpert Xpress SARS-CoV-2/FLU/RSV plus assay is intended as an aid in the diagnosis of influenza from Nasopharyngeal swab specimens and should not be used as a sole basis for treatment. Nasal washings and aspirates are unacceptable for Xpert Xpress SARS-CoV-2/FLU/RSV testing.  Fact Sheet for Patients: EntrepreneurPulse.com.au  Fact Sheet for Healthcare Providers: IncredibleEmployment.be  This test is not yet approved or cleared by the [place] FDA and has been authorized for detection and/or diagnosis of SARS-CoV-2 by FDA under an Emergency Use Authorization (EUA). This EUA will remain in effect (meaning this test can be used) for the duration of the COVID-19 declaration under Section 564(b)(1) of the Act, 21 U.S.C. section 360bbb-3(b)(1), unless the authorization is terminated or revoked.  Performed at [facility], [address], [place], [place] [address]      Labs: BNP (last 3 results) Recent Labs    10/31/19 0635 12/26/19 2255 07/09/20 0631  BNP 237.0* 367.0* 016.0*   Basic Metabolic Panel: Recent Labs  Lab 07/05/20 1359 07/09/20 0631 07/10/20 1013  NA  140 136  --   K 4.2 3.9  --   CL 110 107  --   CO2 24 22  --   GLUCOSE 147* 166*  --   BUN 19 20  --   CREATININE 1.76* 1.65*  --   CALCIUM 9.0 9.1  --   MG  --   --  1.8   Liver Function Tests: Recent Labs  Lab 07/05/20 1359 07/09/20 0631  AST 10 14*  ALT 7 13  ALKPHOS  --  81  BILITOT 0.5 0.9  PROT 6.3 7.4  ALBUMIN  --  4.0   Recent Labs  Lab 07/09/20 0631  LIPASE 28   No results for input(s): AMMONIA in the last 168 hours. CBC: Recent Labs  Lab 07/05/20 1359 07/09/20 0631  WBC 5.7 5.6  NEUTROABS 2,918 3.5  HGB 8.7* 9.6*  HCT 27.6* 30.9*  MCV 88.5 88.8  PLT 241 239   Cardiac Enzymes: No results for input(s): CKTOTAL, CKMB, CKMBINDEX, TROPONINI in the last 168 hours. BNP: Invalid input(s): POCBNP CBG: Recent Labs  Lab 07/09/20 1658 07/09/20 2131 07/10/20 0922  GLUCAP 151* 111* 146*   D-Dimer Recent Labs    07/09/20 1115  DDIMER 1.09*   Hgb  A1c Recent Labs    07/10/20 1013  HGBA1C 7.2*   Lipid Profile No results for input(s): CHOL, HDL, LDLCALC, TRIG, CHOLHDL, LDLDIRECT in the last 72 hours. Thyroid function studies No results for input(s): TSH, T4TOTAL, T3FREE, THYROIDAB in the last 72 hours.  Invalid input(s): FREET3 Anemia work up No results for input(s): VITAMINB12, FOLATE, FERRITIN, TIBC, IRON, RETICCTPCT in the last 72 hours. Urinalysis    Component Value Date/Time   COLORURINE STRAW (A) 07/09/2020 0953   APPEARANCEUR CLEAR 07/09/2020 0953   LABSPEC 1.027 07/09/2020 0953   PHURINE 7.0 07/09/2020 0953   GLUCOSEU 50 (A) 07/09/2020 0953   GLUCOSEU >=1000 (A) 09/21/2018 0829   HGBUR NEGATIVE 07/09/2020 0953   BILIRUBINUR NEGATIVE 07/09/2020 0953   KETONESUR NEGATIVE 07/09/2020 0953   PROTEINUR NEGATIVE 07/09/2020 0953   UROBILINOGEN 0.2 09/21/2018 0829   NITRITE NEGATIVE 07/09/2020 0953   LEUKOCYTESUR TRACE (A) 07/09/2020 0953   Sepsis Labs Invalid input(s): PROCALCITONIN,  WBC,  LACTICIDVEN Microbiology Recent Results  (from the past 240 hour(s))  Resp Panel by RT-PCR (Flu A&B, Covid) Nasopharyngeal Swab     Status: None   Collection Time: 07/09/20 12:08 PM   Specimen: Nasopharyngeal Swab; Nasopharyngeal(NP) swabs in vial transport medium  Result Value Ref Range Status   SARS Coronavirus 2 by RT PCR NEGATIVE NEGATIVE Final    Comment: (NOTE) SARS-CoV-2 target nucleic acids are NOT DETECTED.  The SARS-CoV-2 RNA is generally detectable in upper respiratory specimens during the acute phase of infection. The lowest concentration of SARS-CoV-2 viral copies this assay can detect is 138 copies/mL. A negative result does not preclude SARS-Cov-2 infection and should not be used as the sole basis for treatment or other patient management decisions. A negative result may occur with  improper specimen collection/handling, submission of specimen other than nasopharyngeal swab, presence of viral mutation(s) within the areas targeted by this assay, and inadequate number of viral copies(<138 copies/mL). A negative result must be combined with clinical observations, patient history, and epidemiological information. The expected result is Negative.  Fact Sheet for Patients:  EntrepreneurPulse.com.au  Fact Sheet for Healthcare Providers:  IncredibleEmployment.be  This test is no t yet approved or cleared by the [place] FDA and  has been authorized for detection and/or diagnosis of SARS-CoV-2 by FDA under an Emergency Use Authorization (EUA). This EUA will remain  in effect (meaning this test can be used) for the duration of the COVID-19 declaration under Section 564(b)(1) of the Act, 21 U.S.C.section 360bbb-3(b)(1), unless the authorization is terminated  or revoked sooner.       Influenza A by PCR NEGATIVE NEGATIVE Final   Influenza B by PCR NEGATIVE NEGATIVE Final    Comment: (NOTE) The Xpert Xpress SARS-CoV-2/FLU/RSV plus assay is intended as an aid in the  diagnosis of influenza from Nasopharyngeal swab specimens and should not be used as a sole basis for treatment. Nasal washings and aspirates are unacceptable for Xpert Xpress SARS-CoV-2/FLU/RSV testing.  Fact Sheet for Patients: EntrepreneurPulse.com.au  Fact Sheet for Healthcare Providers: IncredibleEmployment.be  This test is not yet approved or cleared by the [place] FDA and has been authorized for detection and/or diagnosis of SARS-CoV-2 by FDA under an Emergency Use Authorization (EUA). This EUA will remain in effect (meaning this test can be used) for the duration of the COVID-19 declaration under Section 564(b)(1) of the Act, 21 U.S.C. section 360bbb-3(b)(1), unless the authorization is terminated or revoked.  Performed at [facility], [address], [place], [place] [address]  Time coordinating discharge: 35 minutes The [place] controlled substances registry was reviewed for this patient        SIGNED:   Edwin Dada, MD  [facility] 07/10/2020, 9:33 AM

## 2020-07-10 NOTE — TOC Transition Note (Signed)
Transition of Care [facility]) - CM/SW Discharge Note  Patient Details  Name: Robin [name] MRN: [id number] Date of Birth: 10-07-1965  Transition of Care [facility]) CM/SW Contact:  Sherie Don, LCSW Phone Number: 07/10/2020, 10:46 AM  Clinical Narrative: TOC received consult for CHF screening. CSW met with patient to complete assessment. Per patient, she resides at home with her daughter. Patient is independent with ADLs at baseline. Patient is able to afford her medications monthly. Patient reported her daughter currently transports her to appointments.  Per CHF screening, patient reported she is not following a heart healthy diet and is not limiting her salt intake. Patient reported she has not been told to restrict fluid intake and is not completing daily weight checks. No needs identified at this time. TOC signing off.  Final next level of care: Home/Self Care Barriers to Discharge: Barriers Resolved  Patient Goals and CMS Choice Patient states their goals for this hospitalization and ongoing recovery are:: Return home Choice offered to / list presented to : NA  Discharge Plan and Services        DME Arranged: N/A DME Agency: NA HH Arranged: NA HH Agency: NA  Readmission Risk Interventions Readmission Risk Prevention Plan 05/12/2020  Transportation Screening Complete  Medication Review [employer]) Complete  SW Recovery Care/Counseling Consult Complete  Palliative Care Screening Not Applicable  Skilled Nursing Facility Complete  Some recent data might be hidden

## 2020-07-10 NOTE — Progress Notes (Signed)
Nsg Discharge Note  Admit Date:  07/09/2020 Discharge date: 07/10/2020   Izora Gala A Trott to be D/C'd Home  per MD order.  AVS completed.  Patient' sister [name] Nonie Hoyer able to verbalize understanding.  Discharge Medication: Allergies as of 07/10/2020      Reactions   Bee Venom Shortness Of Breath, Swelling   Bodily Swelling   Losartan Other (See Comments)   Nosebleeds per patient report.    Naproxen Other (See Comments)   Acid reflux   Penicillins Nausea Only   Has patient had a PCN reaction causing immediate rash, facial/tongue/throat swelling, SOB or lightheadedness with hypotension: no Has patient had a PCN reaction causing severe rash involving mucus membranes or skin necrosis: no Has patient had a PCN reaction that required hospitalization no Has patient had a PCN reaction occurring within the last 10 years: no If all of the above answers are "NO", then may proceed with Cephalosporin    Lisinopril    Sinus congestion      Medication List    TAKE these medications   acetaminophen 325 MG tablet Commonly known as: TYLENOL Take 650 mg by mouth every 8 (eight) hours as needed for mild pain.   alum & mag hydroxide-simeth 200-200-20 MG/5ML suspension Commonly known as: MAALOX/MYLANTA Take 30 mLs by mouth every 6 (six) hours as needed for indigestion or heartburn.   aspirin EC 81 MG tablet Take 81 mg by mouth daily.   BD Pen Needle Nano U/F 32G X 4 MM Misc Generic drug: Insulin Pen Needle Inject into the skin as directed.   carvedilol 25 MG tablet Commonly known as: Coreg Take 1 tablet (25 mg total) by mouth 2 (two) times daily with a meal. Hold for HR<70, SBP<100   cephALEXin 500 MG capsule Commonly known as: KEFLEX Take 1 capsule (500 mg total) by mouth 3 (three) times daily for 3 days.   citalopram 10 MG tablet Commonly known as: CELEXA Take 1 tablet by mouth once daily   feeding supplement Liqd Take 237 mLs by mouth 2 (two) times daily between meals.    furosemide 20 MG tablet Commonly known as: Lasix Take 1 tablet (20 mg total) by mouth daily.   gabapentin 100 MG capsule Commonly known as: NEURONTIN Take 1 capsule (100 mg total) by mouth 3 (three) times daily.   hydrALAZINE 25 MG tablet Commonly known as: APRESOLINE Take 1 tablet (25 mg total) by mouth 3 (three) times daily.   insulin glargine 100 UNIT/ML injection Commonly known as: LANTUS Inject 10 Units into the skin at bedtime.   Lantus SoloStar 100 UNIT/ML Solostar Pen Generic drug: insulin glargine Inject 14-16 Units into the skin daily. If bs is over 250 give her 16 units   isosorbide mononitrate 30 MG 24 hr tablet Commonly known as: IMDUR Take 2 tablets (60 mg total) by mouth daily.   nitroGLYCERIN 0.4 MG SL tablet Commonly known as: NITROSTAT Place 1 tablet (0.4 mg total) under the tongue every 5 (five) minutes as needed for chest pain.   nystatin ointment Commonly known as: MYCOSTATIN Apply to affected area 1-2 times daily   pantoprazole 40 MG tablet Commonly known as: PROTONIX Take 1 tablet (40 mg total) by mouth daily.   rosuvastatin 20 MG tablet Commonly known as: CRESTOR Take 1 tablet by mouth once daily   ticagrelor 90 MG Tabs tablet Commonly known as: BRILINTA Take 45 mg by mouth 2 (two) times daily.       Discharge Assessment: Vitals:  07/10/20 0523 07/10/20 1133  BP: (!) 159/107 131/90  Pulse: (!) 108 92  Resp: 16 18  Temp: 98.2 F (36.8 C) 98.3 F (36.8 C)  SpO2: 100% 100%   Skin clean, dry and intact without evidence of skin break down, no evidence of skin tears noted. IV catheter discontinued intact. Site without signs and symptoms of complications - no redness or edema noted at insertion site, patient denies c/o pain - only slight tenderness at site.  Dressing with slight pressure applied.  D/c Instructions-Education: Discharge instructions given to patient/family with verbalized understanding. D/c education completed with  patient's sister [name] Nonie Hoyer including follow up instructions, medication list, d/c activities limitations if indicated, with other d/c instructions as indicated by MD - patient able to verbalize understanding, all questions fully answered. Patient instructed to return to ED, call 911, or call MD for any changes in condition.  Patient escorted via [place], and D/C home via private auto.  Berton Bon, RN 07/10/2020 12:06 PM

## 2020-07-10 NOTE — Progress Notes (Signed)
SATURATION QUALIFICATIONS: (This note is used to comply with regulatory documentation for home oxygen)  Patient Saturations on Room Air at Rest = 100 %  Patient Saturations on Room Air while Ambulating = 98%  Patient ambulated approximately 200 feet and denied any distress, discomfort or difficulty breathing. Ambulation well tolerated.

## 2020-07-10 NOTE — Telephone Encounter (Signed)
Pt sister called stating pt is currently in hospital and the attending provider is wanting to change a medication. Pt daughter asked if CMA could return her call to discuss this. Please advise.

## 2020-07-11 LAB — URINE CULTURE: Culture: NO GROWTH

## 2020-07-11 NOTE — Telephone Encounter (Signed)
Left message on voicemail to call office.  

## 2020-07-11 NOTE — Telephone Encounter (Signed)
[name] is returning [name]'s call

## 2020-07-12 NOTE — Telephone Encounter (Signed)
Spoke to pt's sister [name], told her can not tell her if medication changes are right or not, it is up to provider in the hospital what they recommend. [name] verbalized understanding and asked about lab results. See result notes.

## 2020-07-12 NOTE — Telephone Encounter (Signed)
Left message on voicemail to call office.  

## 2020-07-14 ENCOUNTER — Telehealth: Payer: Self-pay

## 2020-07-14 NOTE — Telephone Encounter (Signed)
Spoke with [place] from [place] and I was able to provide her with [name]'s phone number. She was very appreciative for the call.

## 2020-07-14 NOTE — Telephone Encounter (Signed)
Patient was recently hospitalized.  Please make sure that they are calling her sister, [name], to set this up.

## 2020-07-14 NOTE — Telephone Encounter (Signed)
A referral was placed for pt to receive in home nursing. Referral was sent to [facility]. Tanzania from [place] called stating they have reached out to the patient and family multiple times to set up a visit and they have not received any call back. Tanzania wanted to inform [facility]ona [facility] about patient not returning their calls. Please advise.

## 2020-07-14 NOTE — Telephone Encounter (Signed)
See below

## 2020-07-18 ENCOUNTER — Telehealth: Payer: Self-pay

## 2020-07-18 NOTE — Telephone Encounter (Signed)
[facility] home health services order was sent over several weeks ago. They have tried to contact pt for over 2 weeks. Do you want them to keep trying?   [phone] - [place] ok to leave voicemail

## 2020-07-19 ENCOUNTER — Ambulatory Visit: Payer: 59 | Admitting: Student

## 2020-07-19 ENCOUNTER — Encounter: Payer: Self-pay | Admitting: Student

## 2020-07-19 ENCOUNTER — Other Ambulatory Visit: Payer: Self-pay

## 2020-07-19 VITALS — BP 136/84 | HR 84 | Ht 67.0 in | Wt 194.0 lb

## 2020-07-19 DIAGNOSIS — I10 Essential (primary) hypertension: Secondary | ICD-10-CM | POA: Diagnosis not present

## 2020-07-19 DIAGNOSIS — I5042 Chronic combined systolic (congestive) and diastolic (congestive) heart failure: Secondary | ICD-10-CM | POA: Diagnosis not present

## 2020-07-19 DIAGNOSIS — I350 Nonrheumatic aortic (valve) stenosis: Secondary | ICD-10-CM

## 2020-07-19 DIAGNOSIS — I4819 Other persistent atrial fibrillation: Secondary | ICD-10-CM | POA: Diagnosis not present

## 2020-07-19 DIAGNOSIS — E785 Hyperlipidemia, unspecified: Secondary | ICD-10-CM

## 2020-07-19 DIAGNOSIS — Z8673 Personal history of transient ischemic attack (TIA), and cerebral infarction without residual deficits: Secondary | ICD-10-CM

## 2020-07-19 MED ORDER — ASPIRIN EC 81 MG PO TBEC: 81.0000 mg | DELAYED_RELEASE_TABLET | Freq: Every day | ORAL | 3 refills | Status: AC

## 2020-07-19 MED ORDER — APIXABAN 5 MG PO TABS: 5.0000 mg | ORAL_TABLET | Freq: Two times a day (BID) | ORAL | 11 refills | Status: DC

## 2020-07-19 NOTE — Telephone Encounter (Signed)
Please call patient's sister to see what she prefers to do.

## 2020-07-19 NOTE — Patient Instructions (Signed)
Medication Instructions:  STOP Brilinta  START Eliquis 5 mg twice a day   START Enteric coated Aspirin 81 mg daily  *If you need a refill on your cardiac medications before your next appointment, please call your pharmacy*   Lab Work: None today If you have labs (blood work) drawn today and your tests are completely normal, you will receive your results only by: Marland Kitchen MyChart Message (if you have MyChart) OR . A paper copy in the mail If you have any lab test that is abnormal or we need to change your treatment, we will call you to review the results.   Testing/Procedures: None today   Follow-Up: At [facility], you and your health needs are our priority.  As part of our continuing mission to provide you with exceptional heart care, we have created designated Provider Care Teams.  These Care Teams include your primary Cardiologist (physician) and Advanced Practice Providers (APPs -  Physician Assistants and Nurse Practitioners) who all work together to provide you with the care you need, when you need it.  We recommend signing up for the patient portal called "MyChart".  Sign up information is provided on this After Visit Summary.  MyChart is used to connect with patients for Virtual Visits (Telemedicine).  Patients are able to view lab/test results, encounter notes, upcoming appointments, etc.  Non-urgent messages can be sent to your provider as well.   To learn more about what you can do with MyChart, go to [email or web address].    Your next appointment:   3-4 month(s)  The format for your next appointment:   In [place]  Provider:   Bernerd Pho, PA-C   Other Instructions None   Thank you for choosing [facility] !

## 2020-07-19 NOTE — Progress Notes (Signed)
Cardiology Office Note    Date:  07/20/2020   ID:  Robin [name], DOB 10-Jan-1966, MRN [id number]  PCP:  Inda Coke, [place]  Cardiologist: Carlyle Dolly, MD    Chief Complaint  Patient presents with  . Follow-up    3 month visit    History of Present Illness:    Robin [name] is a 54 y.o. female with past medical history of chronic systolic CHF (EF at 30 to 35% by echo in 08/2018 with similar results by echo in 05/2019, EF improved to 65-70% by echo in 04/2020, at 50-55% by echo in 07/2020), persistent atrial fibrillation (diagnosed in 04/2019), HTN, IDDM, COPD, thoracic aortic aneurysm, normal coronaries by catheterization in 2010, prior CVA and history of substance abuse who presents to the office today for 4-month follow-up.   She was last examined by myself in 04/2020 and had been admitted for an MCA infarct in 12/2019 and required thrombectomy with TICI3 reperfusion. Initially required CIR but was living with her sister at the time of her visit and was participating in OT and speech therapy. She denied any recent cardiac symptoms. Given reported compliance with her cardiac medications, it was recommended she have a repeat echocardiogram for reassessment of her EF. This was performed in 04/2020 and showed her EF had normalized to 65-70%. She did have a moderate pericardial effusion and mild to moderate AS.    In the interim, she was admitted to [facility] from 10/7 - 05/18/2020 due to [place] which was felt to be secondary to metabolic encephalopathy. Brain MRI was without acute findings but she was found to have a UTI and was treated with antibiotic therapy. By review of the discharge summary, she was continued on ASA and Brilinta due to upcoming right ICA aneurysm coiling/stenting with Eliquis being held at discharge.  Her PTA Digoxin was also discontinued due to bradycardia and Torsemide discontinued due to AKI.   Was again admitted from 12/5 - 07/10/2020 for evaluation of  worsening dyspnea and palpitations for the past several days. She was found to have an acute CHF exacerbation elevated to 644 and responded well with IV Lasix which was transitioned to PO Lasix 20 mg daily at discharge. Was also in atrial fibrillation with RVR upon admission and this improved with IV Lopressor and she was transitioned to PO Coreg at the time of discharge.  In the interim, her PCP did reach out in regards to if she should be on DAPT versus anticoagulation. This was reviewed with IR as well and given that she had not required aneurysm treatment, was recommended to stay on ASA 81 mg daily and restart Eliquis given her atrial fibrillation with discontinuation of Brilinta. They did mention that Brilinta could be added back in the future if undergoing aneurysm treatment.   In talking with the patient and her sister today, she reports she has returned home and is currently living with her daughter. Her breathing has overall been stable since hospital discharge. She denies any recurrent palpitations. She does report an occasional pain underneath her left breast but is unable to elaborate on when this occurs and denies any recent symptoms. No recent orthopnea, PND or lower extremity edema.  Past Medical History:  Diagnosis Date  . A-fib ([place])   . Allergy   . Anemia   . Arthritis   . CHF (congestive heart failure) ([place])    a. EF at 30-35% by echo in 08/2018 b. EF at 35% by repeat echo in  05/2019  . Chronic abdominal pain   . Chronic atrial fibrillation ([place])   . Cocaine abuse ([place])   . COPD (chronic obstructive pulmonary disease) ([place])   . Essential hypertension, benign   . Expressive aphasia   . Expressive aphasia    post CVA  . Fatty liver   . GERD (gastroesophageal reflux disease)   . Gout 2016  . Normal coronary arteries    3/10 - following abnormal Myoview  . Ovarian cyst   . Stroke ([place]) 12/26/2019   Right sided weakness, and expressive aphasia  . Thoracic ascending aortic  aneurysm ([facility])    4 cm 10/31/19 CTA  . Type 2 diabetes mellitus ([place])    type II    Past Surgical History:  Procedure Laterality Date  . ABDOMINAL HYSTERECTOMY  09/10/2011   Procedure: HYSTERECTOMY ABDOMINAL;  Surgeon: Jonnie Kind, MD;  Location: [facility];  Service: Gynecology;  Laterality: N/A;  Abdominal hysterectomy  . CESAREAN SECTION  [id number], and 1994  . CHOLECYSTECTOMY  1995  . IR 3D INDEPENDENT WKST  03/16/2020  . IR ANGIO INTRA EXTRACRAN SEL INTERNAL CAROTID BILAT MOD SED  03/16/2020  . IR ANGIO VERTEBRAL SEL SUBCLAVIAN INNOMINATE UNI L MOD SED  03/16/2020  . IR ANGIO VERTEBRAL SEL VERTEBRAL UNI R MOD SED  03/16/2020  . IR CT HEAD LTD  12/27/2019  . IR PERCUTANEOUS ART THROMBECTOMY/INFUSION INTRACRANIAL INC DIAG ANGIO  12/27/2019  . IR US GUIDE VASC ACCESS RIGHT  12/27/2019  . IR US GUIDE VASC ACCESS RIGHT  03/16/2020  . RADIOLOGY WITH ANESTHESIA N/A 12/27/2019   Procedure: IR WITH ANESTHESIA;  Surgeon: Luanne Bras, MD;  Location: [facility];  Service: Radiology;  Laterality: N/A;  . SCAR REVISION  09/10/2011   Procedure: SCAR REVISION;  Surgeon: Jonnie Kind, MD;  Location: [facility];  Service: Gynecology;  Laterality: N/A;  Wide Excision of old Cicatrix  . TUBAL LIGATION  1994    Current Medications: Outpatient Medications Prior to Visit  Medication Sig Dispense Refill  . acetaminophen (TYLENOL) 325 MG tablet Take 650 mg by mouth every 8 (eight) hours as needed for mild pain.    . BD PEN NEEDLE NANO U/F 32G X 4 MM MISC Inject into the skin as directed.    . carvedilol (COREG) 25 MG tablet Take 1 tablet (25 mg total) by mouth 2 (two) times daily with a meal. Hold for HR<70, SBP<100 60 tablet 0  . citalopram (CELEXA) 10 MG tablet Take 1 tablet by mouth once daily 30 tablet 0  . feeding supplement, ENSURE ENLIVE, (ENSURE ENLIVE) LIQD Take 237 mLs by mouth 2 (two) times daily between meals. 237 mL 12  . furosemide (LASIX) 20 MG tablet Take 1 tablet (20 mg total) by mouth daily. 30  tablet 11  . gabapentin (NEURONTIN) 100 MG capsule Take 1 capsule (100 mg total) by mouth 3 (three) times daily. 270 capsule 3  . hydrALAZINE (APRESOLINE) 25 MG tablet Take 1 tablet (25 mg total) by mouth 3 (three) times daily. 120 tablet 11  . insulin glargine (LANTUS) 100 UNIT/ML injection Inject 10 Units into the skin at bedtime.    . isosorbide mononitrate (IMDUR) 30 MG 24 hr tablet Take 2 tablets (60 mg total) by mouth daily. 60 tablet 11  . LANTUS SOLOSTAR 100 UNIT/ML Solostar Pen Inject 14-16 Units into the skin daily. If bs is over 250 give her 16 units    . nitroGLYCERIN (NITROSTAT) 0.4 MG SL tablet Place 1 tablet (  0.4 mg total) under the tongue every 5 (five) minutes as needed for chest pain. 30 tablet 12  . pantoprazole (PROTONIX) 40 MG tablet Take 1 tablet (40 mg total) by mouth daily. 90 tablet 1  . rosuvastatin (CRESTOR) 20 MG tablet Take 1 tablet by mouth once daily 30 tablet 0  . ticagrelor (BRILINTA) 90 MG TABS tablet Take 45 mg by mouth 2 (two) times daily.    Marland Kitchen alum & mag hydroxide-simeth (MAALOX/MYLANTA) 200-200-20 MG/5ML suspension Take 30 mLs by mouth every 6 (six) hours as needed for indigestion or heartburn. (Patient not taking: Reported on 07/05/2020) 355 mL 0  . aspirin EC 81 MG tablet Take 81 mg by mouth daily.  (Patient not taking: Reported on 07/05/2020)    . nystatin ointment (MYCOSTATIN) Apply to affected area 1-2 times daily (Patient not taking: Reported on 07/05/2020) 30 g 0   No facility-administered medications prior to visit.     Allergies:   Bee venom, Losartan, Naproxen, Penicillins, and Lisinopril   Social History   Socioeconomic History  . Marital status: Single    Spouse name: Not on file  . Number of children: Not on file  . Years of education: Not on file  . Highest education level: Not on file  Occupational History  . Not on file  Tobacco Use  . Smoking status: Former Smoker    Years: 29.00    Types: Cigarettes    Quit date: 12/2019    Years  since quitting: 0.6  . Smokeless tobacco: Never Used  Vaping Use  . Vaping Use: Never used  Substance and Sexual Activity  . Alcohol use: Not Currently    Comment: occ  . Drug use: Not Currently    Types: Cocaine, Marijuana  . Sexual activity: Not Currently    Birth control/protection: Surgical  Other Topics Concern  . Not on file  Social History Narrative   Part-time at [employer], lives in [place], [place]   3 children    Married   Social Determinants of Health   Financial Resource Strain: Not on file  Food Insecurity: Not on file  Transportation Needs: Not on file  Physical Activity: Not on file  Stress: Not on file  Social Connections: Not on file     Family History:  The patient's family history includes Alcohol abuse in her father, mother, and son; Cirrhosis in her mother; Diabetes type II in her father and sister; Early death in her brother and mother.   Review of Systems:   Please see the history of present illness.     General:  No chills, fever, night sweats or weight changes.  Cardiovascular:  No chest pain, dyspnea on exertion, edema, orthopnea, palpitations, paroxysmal nocturnal dyspnea. Dermatological: No rash, lesions/masses Respiratory: No cough, dyspnea Urologic: No hematuria, dysuria Abdominal:   No nausea, vomiting, diarrhea, bright red blood per rectum, melena, or hematemesis Neurologic:  No visual changes, wkns, changes in mental status. Positive for speech difficulties.   All other systems reviewed and are otherwise negative except as noted above.   Physical Exam:    VS:  BP 136/84   Pulse 84   Ht 5\' 7"  (1.702 m)   Wt 194 lb (88 kg)   LMP 08/21/2011   SpO2 98%   BMI 30.38 kg/m    General: Well developed, well nourished,female appearing in no acute distress. Head: Normocephalic, atraumatic. Neck: No carotid bruits. JVD not elevated.  Lungs: Respirations regular and unlabored, without wheezes or rales.  Heart: Irregularly irregular. No S3  or S4. 2/6 SEM along RUSB.  Abdomen: Appears non-distended. No obvious abdominal masses. Msk:  Strength and tone appear normal for age. No obvious joint deformities or effusions. Extremities: No clubbing or cyanosis. No edema.  Distal pedal pulses are 2+ bilaterally. Neuro: Alert and oriented X 3. Moves all extremities spontaneously. Dysarthria noted.  Psych:  Responds to questions appropriately with a normal affect. Skin: No rashes or lesions noted  Wt Readings from Last 3 Encounters:  07/19/20 194 lb (88 kg)  07/10/20 195 lb 1.7 oz (88.5 kg)  07/05/20 193 lb (87.5 kg)     Studies/Labs Reviewed:   EKG:  EKG is not ordered today.   Recent Labs: 05/11/2020: TSH 1.683 07/09/2020: ALT 13; B Natriuretic Peptide 644.0; BUN 20; Creatinine, Ser 1.65; Hemoglobin 9.6; Platelets 239; Potassium 3.9; Sodium 136 07/10/2020: Magnesium 1.8   Lipid Panel    Component Value Date/Time   CHOL 58 07/05/2020 1359   TRIG 51 07/05/2020 1359   HDL 28 (L) 07/05/2020 1359   CHOLHDL 2.1 07/05/2020 1359   VLDL 26 12/27/2019 0305   LDLCALC 17 07/05/2020 1359    Additional studies/ records that were reviewed today include:   Limited Echo: 05/2020 IMPRESSIONS    1. Left ventricular ejection fraction, by estimation, is 70 to 75%. The  left ventricle has hyperdynamic function. The left ventricle has no  regional wall motion abnormalities. There is moderate left ventricular  hypertrophy. Left ventricular diastolic  parameters are indeterminate.  2. Right ventricular systolic function is normal. The right ventricular  size is normal. Tricuspid regurgitation signal is inadequate for assessing  PA pressure.  3. A small pericardial effusion is present. The pericardial effusion is  circumferential.  4. The mitral valve is abnormal. There is mild prolapse of the anterior  leaflet. Mild mitral valve regurgitation.  5. The aortic valve is tricuspid. Aortic valve regurgitation is not  visualized.  6.  The inferior vena cava is normal in size with greater than 50%  respiratory variability, suggesting right atrial pressure of 3 mmHg.    Limited Echo: 07/2020 IMPRESSIONS    1. Left ventricular ejection fraction, by estimation, is 50 to 55%. The  left ventricle has low normal function.  2. A small pericardial effusion is present. The pericardial effusion is  circumferential. There is no evidence of cardiac tamponade. Effusion  appears slightly larger compared to the 05/12/2020 study.  3. The inferior vena cava is normal in size with greater than 50%  respiratory variability, suggesting right atrial pressure of 3 mmHg.    Assessment:    1. Persistent atrial fibrillation ([place])   2. Chronic combined systolic and diastolic CHF (congestive heart failure) ([place])   3. Aortic valve stenosis, etiology of cardiac valve disease unspecified   4. Essential hypertension   5. Hyperlipidemia LDL goal <70   6. History of CVA (cerebrovascular accident)      Plan:   In order of problems listed above:  1. Persistent Atrial Fibrillation - She denies any recent palpitations and HR is well-controlled in the 80's during today's visit. Continue Coreg 25mg  BID for rate-control. - She denies any evidence of active bleeding. Currently on ASA and Brilinta but I did review with her PCP and IR prior to today's visit and they are in agreement to stop Brilinta given no plans for aneurysm treatment at this time and restart Eliquis for anticoagulation. Therefore, will stop Brilinta and restart Eliquis 5mg  BID.   2. Chronic  Combined Systolic and Diastolic CHF - Her EF was at 30 to 35% by echo in 08/2018 with similar results by echo in 05/2019, EF improved to 65-70% by echo in 04/2020 and at 50-55% by echo in 07/2020. - Her breathing has been stable and she denies any orthopnea, PND or edema. Continue Coreg 25mg  BID, Hydralazine 25mg  TID and Imdur 60mg  daily. Previously not on ACE-I/ARB/ARNI due to variable renal  function.   3. Aortic Stenosis - Mild to moderate by echo in 04/2020. Will continue to follow.   4. HTN - BP is well-controlled at 136/84 during today's visit. Continue current medication regimen.   5. HLD - LDL was at 17 when checked earlier this month. Continue current regimen with Crestor 20mg  daily.   6. History of CVA - She is s/p MCA infarct in 12/2019 and required thrombectomy with TICI3 reperfusion. She is being followed by IR for possible right ICA aneurysm coiling/stenting in the future. IR did recommend we stop Brilinta for now but continue ASA 81mg  daily with Eliquis.     Medication Adjustments/Labs and Tests Ordered: Current medicines are reviewed at length with the patient today.  Concerns regarding medicines are outlined above.  Medication changes, Labs and Tests ordered today are listed in the Patient Instructions below. Patient Instructions  Medication Instructions:  STOP Brilinta  START Eliquis 5 mg twice a day   START Enteric coated Aspirin 81 mg daily  *If you need a refill on your cardiac medications before your next appointment, please call your pharmacy*   Lab Work: None today If you have labs (blood work) drawn today and your tests are completely normal, you will receive your results only by: Marland Kitchen MyChart Message (if you have MyChart) OR . A paper copy in the mail If you have any lab test that is abnormal or we need to change your treatment, we will call you to review the results.   Testing/Procedures: None today   Follow-Up: At [facility], you and your health needs are our priority.  As part of our continuing mission to provide you with exceptional heart care, we have created designated Provider Care Teams.  These Care Teams include your primary Cardiologist (physician) and Advanced Practice Providers (APPs -  Physician Assistants and Nurse Practitioners) who all work together to provide you with the care you need, when you need it.  We recommend  signing up for the patient portal called "MyChart".  Sign up information is provided on this After Visit Summary.  MyChart is used to connect with patients for Virtual Visits (Telemedicine).  Patients are able to view lab/test results, encounter notes, upcoming appointments, etc.  Non-urgent messages can be sent to your provider as well.   To learn more about what you can do with MyChart, go to [email or web address].    Your next appointment:   3-4 month(s)  The format for your next appointment:   In [place]  Provider:   Bernerd Pho, PA-C   Other Instructions None   Thank you for choosing [facility] !          Signed, [name], PA-C  07/20/2020 9:08 AM    [place] [address] [place], [place] [address] Phone: [phone] Fax: [phone]

## 2020-07-19 NOTE — Telephone Encounter (Signed)
Stop visits?

## 2020-07-20 ENCOUNTER — Encounter: Payer: Self-pay | Admitting: Student

## 2020-07-23 ENCOUNTER — Other Ambulatory Visit: Payer: Self-pay | Admitting: Physician Assistant

## 2020-07-23 DIAGNOSIS — R4701 Aphasia: Secondary | ICD-10-CM

## 2020-07-23 DIAGNOSIS — E876 Hypokalemia: Secondary | ICD-10-CM

## 2020-07-24 ENCOUNTER — Ambulatory Visit (INDEPENDENT_AMBULATORY_CARE_PROVIDER_SITE_OTHER): Payer: 59 | Admitting: Physician Assistant

## 2020-07-24 ENCOUNTER — Encounter: Payer: Self-pay | Admitting: Physician Assistant

## 2020-07-24 ENCOUNTER — Other Ambulatory Visit: Payer: Self-pay

## 2020-07-24 VITALS — BP 140/90 | HR 105 | Temp 98.0°F | Ht 67.0 in | Wt 193.5 lb

## 2020-07-24 DIAGNOSIS — M79622 Pain in left upper arm: Secondary | ICD-10-CM | POA: Diagnosis not present

## 2020-07-24 DIAGNOSIS — I63512 Cerebral infarction due to unspecified occlusion or stenosis of left middle cerebral artery: Secondary | ICD-10-CM

## 2020-07-24 DIAGNOSIS — E1143 Type 2 diabetes mellitus with diabetic autonomic (poly)neuropathy: Secondary | ICD-10-CM

## 2020-07-24 DIAGNOSIS — I69320 Aphasia following cerebral infarction: Secondary | ICD-10-CM | POA: Diagnosis not present

## 2020-07-24 DIAGNOSIS — R4701 Aphasia: Secondary | ICD-10-CM

## 2020-07-24 DIAGNOSIS — Z794 Long term (current) use of insulin: Secondary | ICD-10-CM | POA: Diagnosis not present

## 2020-07-24 NOTE — Patient Instructions (Addendum)
It was great to see you!  An order for an xray has been put in for your arm if you still continue to have symptoms. To get your xray, you can walk in at the [facility] location without a scheduled appointment.  The address is [address]. It is across the street from [place] is located in the basement.  Hours of operation are M-F 8:30am to 5:00pm. Please note that they are closed for lunch between 12:30 and 1:00pm.  I will resubmit orders for physical and speech therapy.  Please call and follow-up with [facility] at [phone] to review blood sugar control.   Take care,  Inda Coke PA-C
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
## 2020-07-25 NOTE — Telephone Encounter (Signed)
Pt was seen yesterday and discussed.

## 2020-08-14 ENCOUNTER — Emergency Department (HOSPITAL_COMMUNITY): Payer: 59

## 2020-08-14 ENCOUNTER — Encounter (HOSPITAL_COMMUNITY): Payer: Self-pay

## 2020-08-14 ENCOUNTER — Inpatient Hospital Stay (HOSPITAL_COMMUNITY)
Admission: EM | Admit: 2020-08-14 | Discharge: 2020-08-16 | DRG: 871 | Disposition: A | Discharge status: Home Health | Payer: 59 | Attending: Internal Medicine | Admitting: Internal Medicine

## 2020-08-14 ENCOUNTER — Other Ambulatory Visit: Payer: Self-pay

## 2020-08-14 DIAGNOSIS — J189 Pneumonia, unspecified organism: Secondary | ICD-10-CM | POA: Diagnosis present

## 2020-08-14 DIAGNOSIS — I130 Hypertensive heart and chronic kidney disease with heart failure and stage 1 through stage 4 chronic kidney disease, or unspecified chronic kidney disease: Secondary | ICD-10-CM | POA: Diagnosis present

## 2020-08-14 DIAGNOSIS — Z811 Family history of alcohol abuse and dependence: Secondary | ICD-10-CM | POA: Diagnosis not present

## 2020-08-14 DIAGNOSIS — J440 Chronic obstructive pulmonary disease with acute lower respiratory infection: Secondary | ICD-10-CM | POA: Diagnosis present

## 2020-08-14 DIAGNOSIS — I482 Chronic atrial fibrillation, unspecified: Secondary | ICD-10-CM | POA: Diagnosis present

## 2020-08-14 DIAGNOSIS — I69320 Aphasia following cerebral infarction: Secondary | ICD-10-CM | POA: Diagnosis not present

## 2020-08-14 DIAGNOSIS — I712 Thoracic aortic aneurysm, without rupture: Secondary | ICD-10-CM | POA: Diagnosis present

## 2020-08-14 DIAGNOSIS — I5042 Chronic combined systolic (congestive) and diastolic (congestive) heart failure: Secondary | ICD-10-CM | POA: Diagnosis present

## 2020-08-14 DIAGNOSIS — Z79899 Other long term (current) drug therapy: Secondary | ICD-10-CM | POA: Diagnosis not present

## 2020-08-14 DIAGNOSIS — Z888 Allergy status to other drugs, medicaments and biological substances status: Secondary | ICD-10-CM

## 2020-08-14 DIAGNOSIS — Z7982 Long term (current) use of aspirin: Secondary | ICD-10-CM

## 2020-08-14 DIAGNOSIS — Z20822 Contact with and (suspected) exposure to covid-19: Secondary | ICD-10-CM | POA: Diagnosis present

## 2020-08-14 DIAGNOSIS — Z7901 Long term (current) use of anticoagulants: Secondary | ICD-10-CM

## 2020-08-14 DIAGNOSIS — Z794 Long term (current) use of insulin: Secondary | ICD-10-CM | POA: Diagnosis not present

## 2020-08-14 DIAGNOSIS — I69351 Hemiplegia and hemiparesis following cerebral infarction affecting right dominant side: Secondary | ICD-10-CM | POA: Diagnosis not present

## 2020-08-14 DIAGNOSIS — A419 Sepsis, unspecified organism: Secondary | ICD-10-CM | POA: Diagnosis present

## 2020-08-14 DIAGNOSIS — K760 Fatty (change of) liver, not elsewhere classified: Secondary | ICD-10-CM | POA: Diagnosis present

## 2020-08-14 DIAGNOSIS — Z833 Family history of diabetes mellitus: Secondary | ICD-10-CM | POA: Diagnosis not present

## 2020-08-14 DIAGNOSIS — Z886 Allergy status to analgesic agent status: Secondary | ICD-10-CM

## 2020-08-14 DIAGNOSIS — K219 Gastro-esophageal reflux disease without esophagitis: Secondary | ICD-10-CM | POA: Diagnosis present

## 2020-08-14 DIAGNOSIS — E1165 Type 2 diabetes mellitus with hyperglycemia: Secondary | ICD-10-CM | POA: Diagnosis present

## 2020-08-14 DIAGNOSIS — I4891 Unspecified atrial fibrillation: Secondary | ICD-10-CM

## 2020-08-14 DIAGNOSIS — Z91030 Bee allergy status: Secondary | ICD-10-CM

## 2020-08-14 DIAGNOSIS — Z880 Allergy status to penicillin: Secondary | ICD-10-CM | POA: Diagnosis not present

## 2020-08-14 DIAGNOSIS — E1122 Type 2 diabetes mellitus with diabetic chronic kidney disease: Secondary | ICD-10-CM | POA: Diagnosis present

## 2020-08-14 DIAGNOSIS — N1831 Chronic kidney disease, stage 3a: Secondary | ICD-10-CM | POA: Diagnosis present

## 2020-08-14 DIAGNOSIS — Z8673 Personal history of transient ischemic attack (TIA), and cerebral infarction without residual deficits: Secondary | ICD-10-CM

## 2020-08-14 DIAGNOSIS — Z87891 Personal history of nicotine dependence: Secondary | ICD-10-CM | POA: Diagnosis not present

## 2020-08-14 LAB — COMPREHENSIVE METABOLIC PANEL
ALT: 11 U/L (ref 0–44)
AST: 15 U/L (ref 15–41)
Albumin: 3.9 g/dL (ref 3.5–5.0)
Alkaline Phosphatase: 85 U/L (ref 38–126)
Anion gap: 9 (ref 5–15)
BUN: 20 mg/dL (ref 6–20)
CO2: 22 mmol/L (ref 22–32)
Calcium: 9 mg/dL (ref 8.9–10.3)
Chloride: 104 mmol/L (ref 98–111)
Creatinine, Ser: 1.83 mg/dL — ABNORMAL HIGH (ref 0.44–1.00)
GFR, Estimated: 32 mL/min — ABNORMAL LOW (ref >60)
Glucose, Bld: 187 mg/dL — ABNORMAL HIGH (ref 70–99)
Potassium: 3.6 mmol/L (ref 3.5–5.1)
Sodium: 135 mmol/L (ref 135–145)
Total Bilirubin: 0.6 mg/dL (ref 0.3–1.2)
Total Protein: 7.6 g/dL (ref 6.5–8.1)

## 2020-08-14 LAB — RESP PANEL BY RT-PCR (FLU A&B, COVID) ARPGX2
Influenza A by PCR: NEGATIVE
Influenza B by PCR: NEGATIVE
SARS Coronavirus 2 by RT PCR: NEGATIVE

## 2020-08-14 LAB — URINALYSIS, ROUTINE W REFLEX MICROSCOPIC
Bilirubin Urine: NEGATIVE
Glucose, UA: 50 mg/dL — AB
Hgb urine dipstick: NEGATIVE
Ketones, ur: NEGATIVE mg/dL
Nitrite: NEGATIVE
Protein, ur: 30 mg/dL — AB
Specific Gravity, Urine: 1.013 (ref 1.005–1.030)
pH: 6 (ref 5.0–8.0)

## 2020-08-14 LAB — RAPID URINE DRUG SCREEN, HOSP PERFORMED
Amphetamines: NOT DETECTED
Barbiturates: NOT DETECTED
Benzodiazepines: NOT DETECTED
Cocaine: NOT DETECTED
Opiates: NOT DETECTED
Tetrahydrocannabinol: NOT DETECTED

## 2020-08-14 LAB — CBC WITH DIFFERENTIAL/PLATELET
Abs Immature Granulocytes: 0.03 10*3/uL (ref 0.00–0.07)
Basophils Absolute: 0 10*3/uL (ref 0.0–0.1)
Basophils Relative: 0 %
Eosinophils Absolute: 0.1 10*3/uL (ref 0.0–0.5)
Eosinophils Relative: 1 %
HCT: 35.2 % — ABNORMAL LOW (ref 36.0–46.0)
Hemoglobin: 10.7 g/dL — ABNORMAL LOW (ref 12.0–15.0)
Immature Granulocytes: 0 %
Lymphocytes Relative: 17 %
Lymphs Abs: 1.8 10*3/uL (ref 0.7–4.0)
MCH: 25.5 pg — ABNORMAL LOW (ref 26.0–34.0)
MCHC: 30.4 g/dL (ref 30.0–36.0)
MCV: 84 fL (ref 80.0–100.0)
Monocytes Absolute: 0.5 10*3/uL (ref 0.1–1.0)
Monocytes Relative: 5 %
Neutro Abs: 8.4 10*3/uL — ABNORMAL HIGH (ref 1.7–7.7)
Neutrophils Relative %: 77 %
Platelets: 296 10*3/uL (ref 150–400)
RBC: 4.19 MIL/uL (ref 3.87–5.11)
RDW: 13.8 % (ref 11.5–15.5)
WBC: 10.9 10*3/uL — ABNORMAL HIGH (ref 4.0–10.5)
nRBC: 0 % (ref 0.0–0.2)

## 2020-08-14 LAB — PROTIME-INR
INR: 1.4 — ABNORMAL HIGH (ref 0.8–1.2)
Prothrombin Time: 16.3 seconds — ABNORMAL HIGH (ref 11.4–15.2)

## 2020-08-14 LAB — APTT: aPTT: 29 seconds (ref 24–36)

## 2020-08-14 LAB — CBG MONITORING, ED: Glucose-Capillary: 219 mg/dL — ABNORMAL HIGH (ref 70–99)

## 2020-08-14 LAB — LACTIC ACID, PLASMA
Lactic Acid, Venous: 1.9 mmol/L (ref 0.5–1.9)
Lactic Acid, Venous: 1.1 mmol/L (ref 0.5–1.9)

## 2020-08-14 LAB — MAGNESIUM: Magnesium: 1.9 mg/dL (ref 1.7–2.4)

## 2020-08-14 IMAGING — CT CT ANGIO CHEST
2 of 6 series · 17 of 46 positions shown · IV contrast (Omnipaque or Isovue)
Comparison: Chest CT dated [DATE].

CLINICAL DATA: 54-year-old female with concern for pulmonary
embolism.

EXAM:
CT ANGIOGRAPHY CHEST
CT ABDOMEN AND PELVIS WITH CONTRAST
TECHNIQUE: Multidetector CT imaging of the chest was performed using the
standard protocol during bolus administration of intravenous
contrast. Multiplanar CT image reconstructions and MIPs were
obtained to evaluate the vascular anatomy. Multidetector CT imaging
of the abdomen and pelvis was performed using the standard protocol
during bolus administration of intravenous contrast.
CONTRAST:  75mL OMNIPAQUE IOHEXOL 350 MG/ML SOLN

[Series 5: pe axialthins · axial · 0.72mm/px · z∈[+1288,+1546]mm · 14 of 284 slices shown]
[im 13/284  lung]
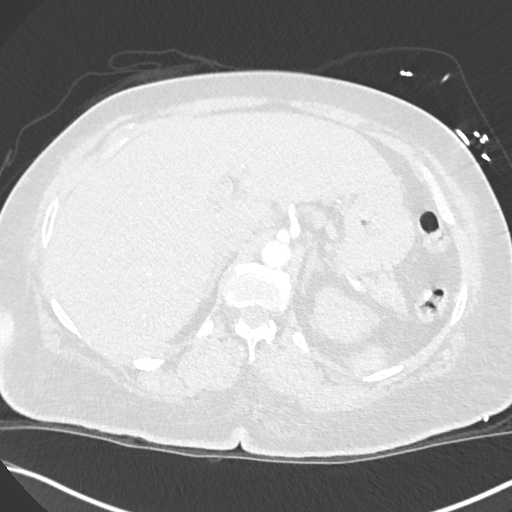
[im 37/284  soft-tissue]
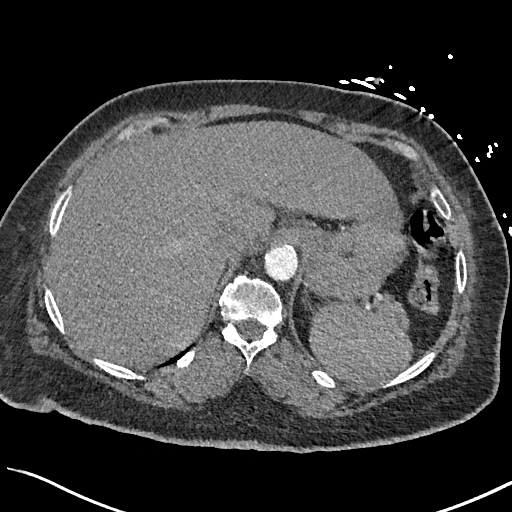
[im 50/284  lung]
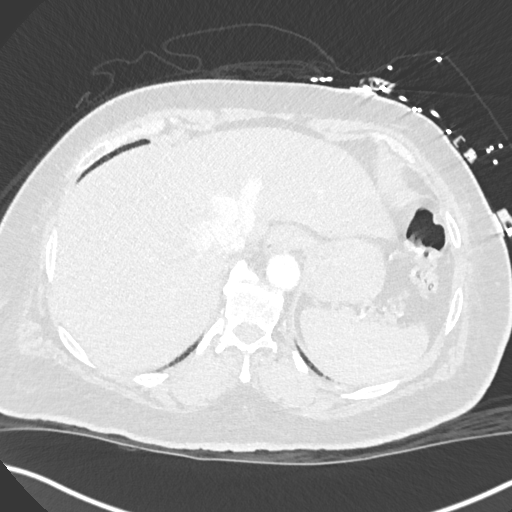
[im 74/284  soft-tissue]
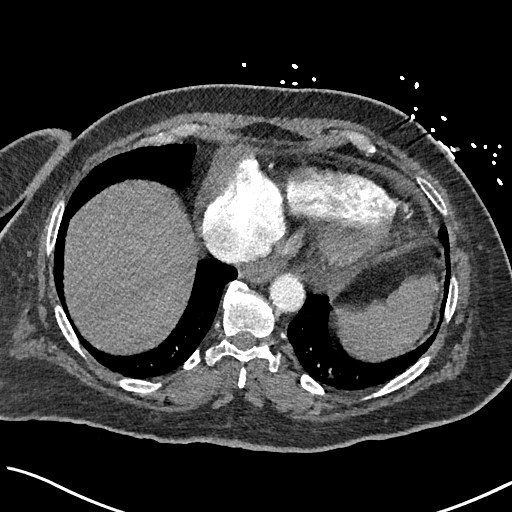
[im 99/284  lung]
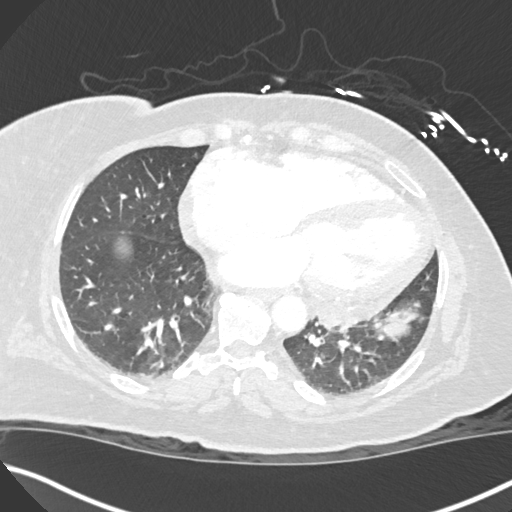
[im 111/284  soft-tissue]
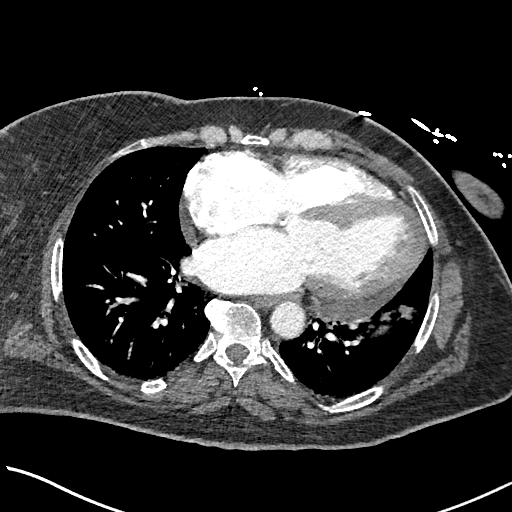
[im 136/284  lung]
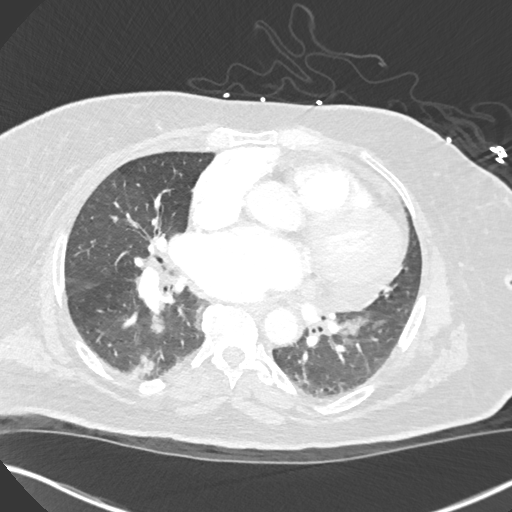
[im 148/284  soft-tissue]
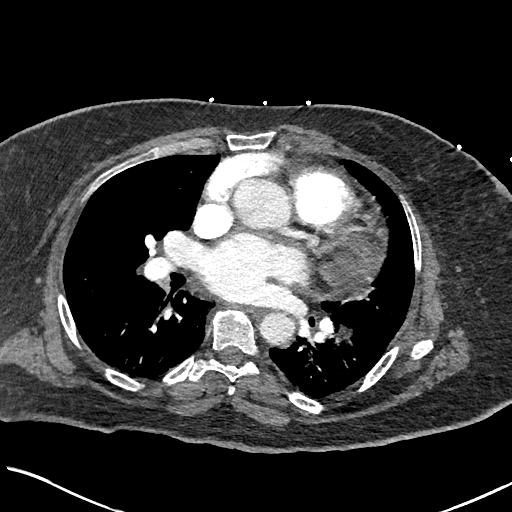
[im 173/284  lung]
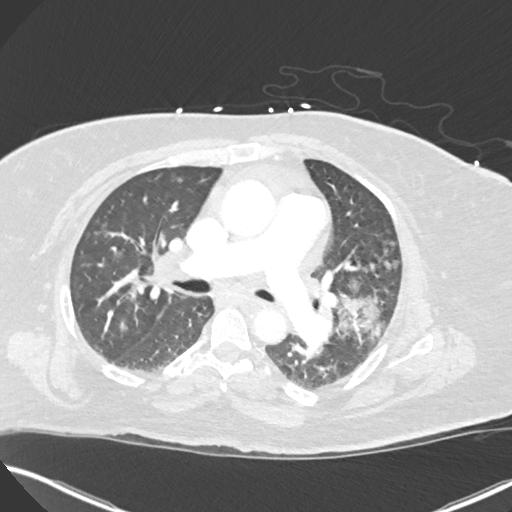
[im 185/284  soft-tissue]
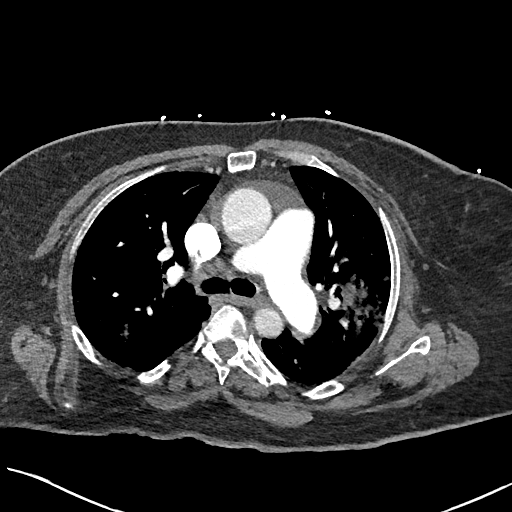
[im 210/284  lung]
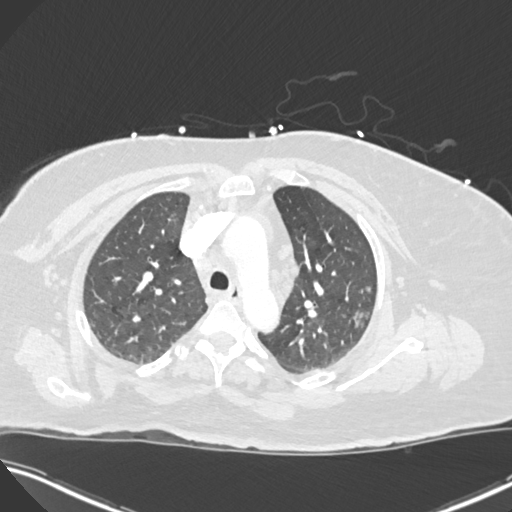
[im 234/284  soft-tissue]
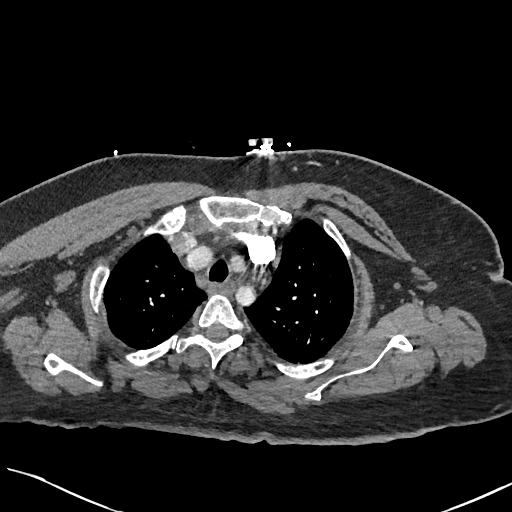
[im 247/284  lung]
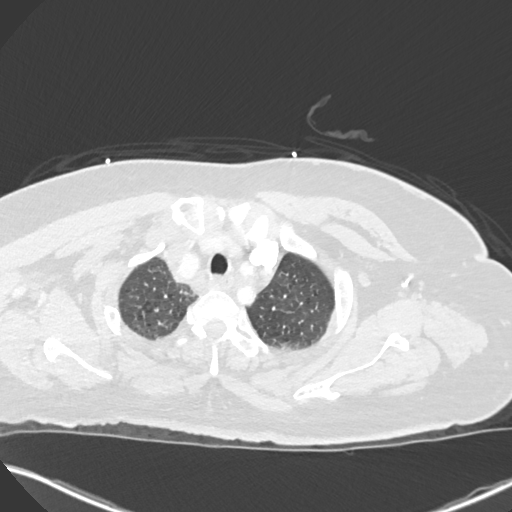
[im 271/284  soft-tissue]
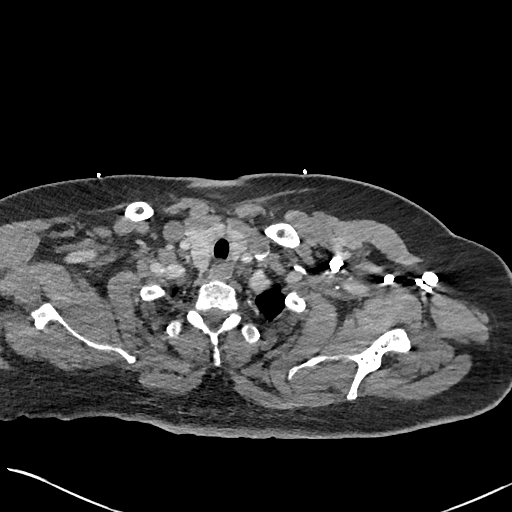

[Series 8: cor soft · coronal · 0.55mm/px · 3 of 126 slices shown]
[im 32/126  soft-tissue]
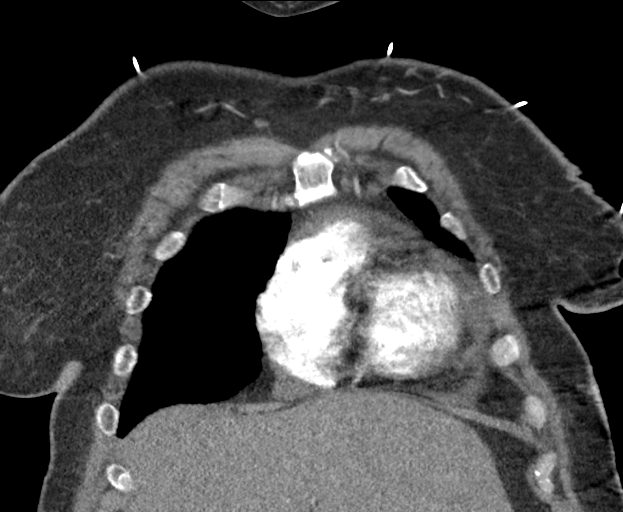
[im 63/126  soft-tissue]
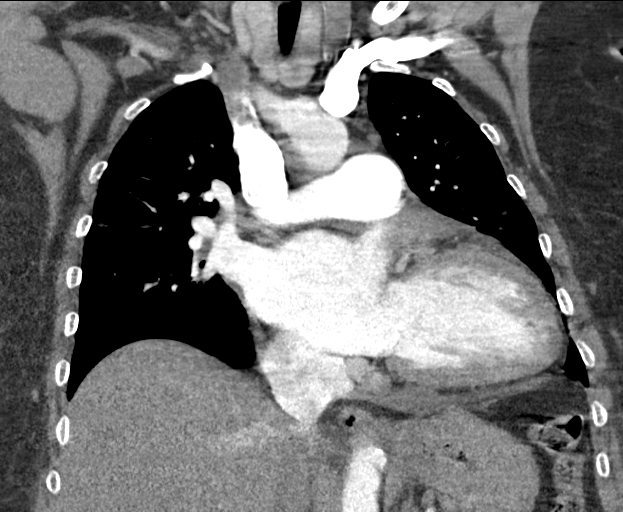
[im 94/126  soft-tissue]
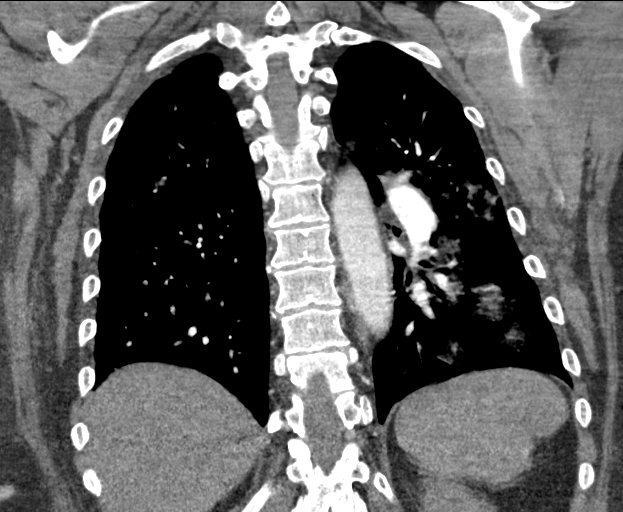

[17 of 46 positions shown; findings below may reference images not displayed]

FINDINGS: CTA CHEST FINDINGS

Cardiovascular: Moderate cardiomegaly. There is a small pericardial
effusion measuring 15 mm in thickness along the left ventricle.
There is retrograde flow of contrast from the right atrium into the
IVC suggestive of right heart dysfunction. The thoracic aorta is
unremarkable. Evaluation of the pulmonary arteries is limited due to
respiratory motion artifact. No pulmonary artery embolus identified.

Mediastinum/Nodes: Mildly enlarged bilateral hilar and mediastinal
lymph nodes, reactive. The esophagus and thyroid gland are grossly
unremarkable. No mediastinal fluid collection.

Lungs/Pleura: Bilateral confluent nodular and ground-glass opacities
consistent with multifocal pneumonia. There is no pleural effusion
or pneumothorax. The central airways are patent.

Musculoskeletal: No chest wall abnormality. No acute or significant
osseous findings.

Review of the MIP images confirms the above findings.

CT ABDOMEN and PELVIS FINDINGS

No intra-abdominal free air or free fluid.

Hepatobiliary: Probable background of mild fatty liver. No
intrahepatic biliary dilatation. Cholecystectomy.

Pancreas: Unremarkable. No pancreatic ductal dilatation or
surrounding inflammatory changes.

Spleen: Normal in size without focal abnormality.

Adrenals/Urinary Tract: The adrenal glands unremarkable. The
kidneys, visualized ureters, and urinary bladder appear
unremarkable.

Stomach/Bowel: There is no bowel obstruction or active inflammation.
The appendix is normal.

Vascular/Lymphatic: Mild aortoiliac atherosclerotic disease. The IVC
is unremarkable. No portal venous gas. There is no adenopathy.

Reproductive: Hysterectomy. No adnexal masses.

Other: None

Musculoskeletal: Degenerative changes of the spine. No acute osseous
pathology.

Review of the MIP images confirms the above findings.
IMPRESSION: 1. No CT evidence of pulmonary embolism.
2. Multifocal pneumonia. Clinical correlation and follow-up to
resolution recommended.
3. Moderate cardiomegaly with a small pericardial effusion.
4. No acute intra-abdominal or pelvic pathology. No bowel
obstruction. Normal appendix.
5. Aortic Atherosclerosis ([SU]-[SU]).

## 2020-08-14 IMAGING — CT CT HEAD W/O CM
3 series · 15 of 46 positions shown, 18 images · non-contrast
Comparison: [DATE]

CLINICAL DATA: Neuro deficit, acute, stroke suspected. Decreased
oral intake. History of stroke.

EXAM:
CT HEAD WITHOUT CONTRAST
TECHNIQUE: Contiguous axial images were obtained from the base of the skull
through the vertex without intravenous contrast.

[Series 2: head w o · axial · 0.40mm/px · z∈[+24,+144]mm · 9 of 29 slices shown, 12 images]
[im 3/29  brain]
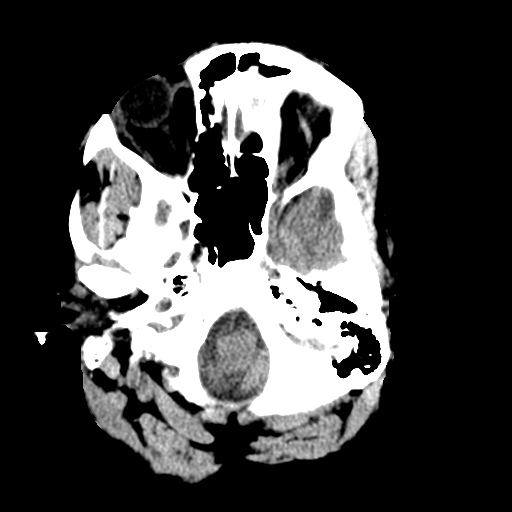
[im 3/29  bone]
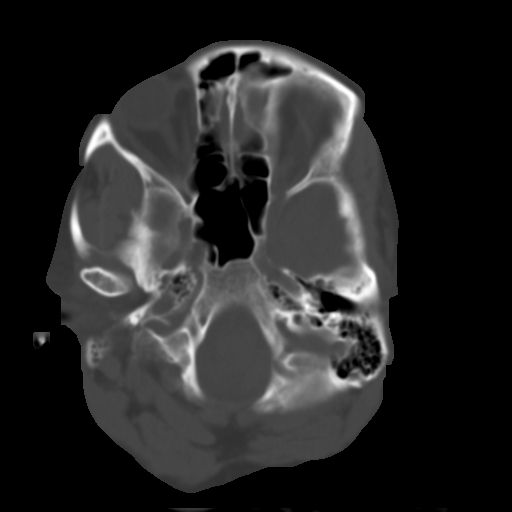
[im 6/29  brain]
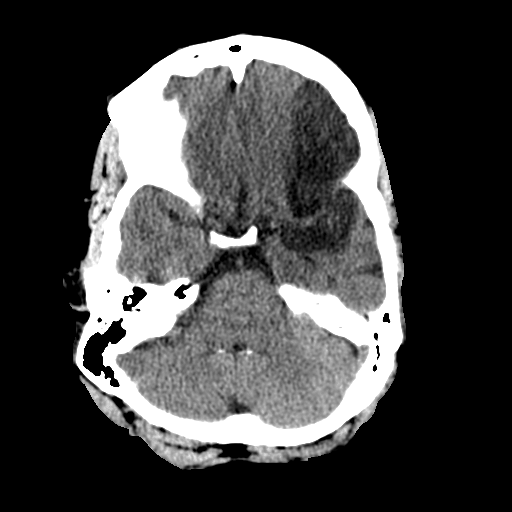
[im 9/29  brain]
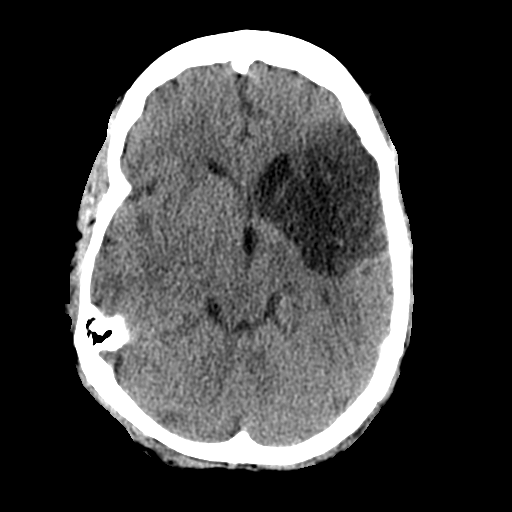
[im 12/29  brain]
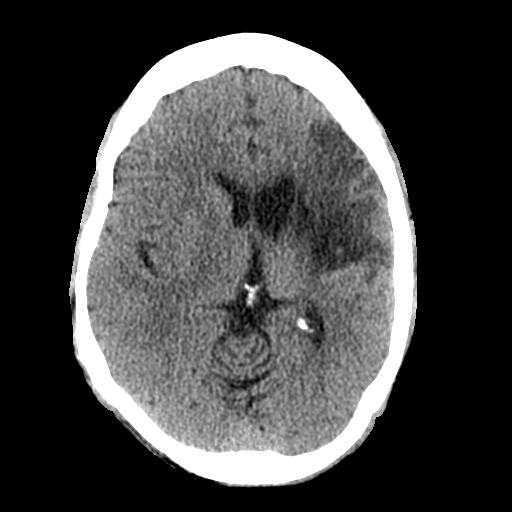
[im 15/29  brain]
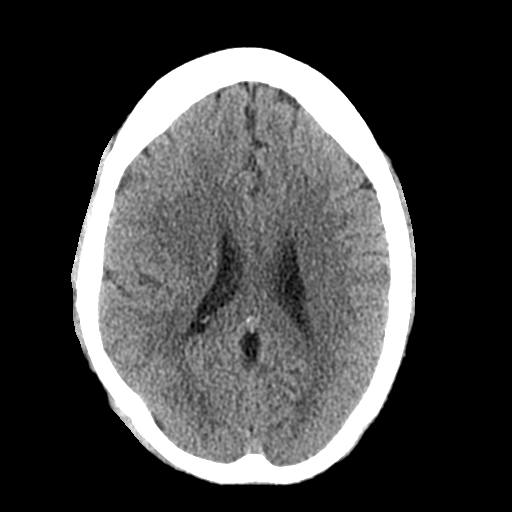
[im 15/29  bone]
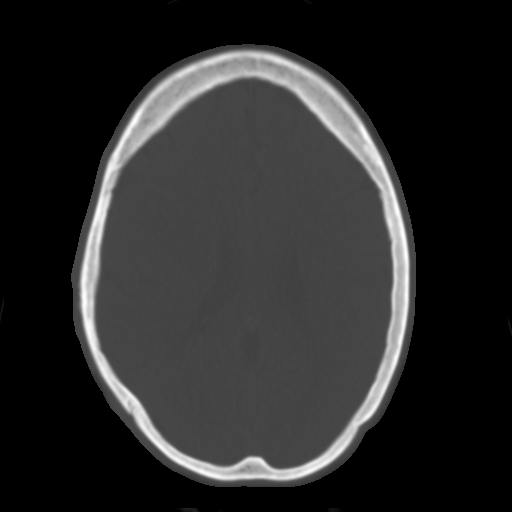
[im 18/29  brain]
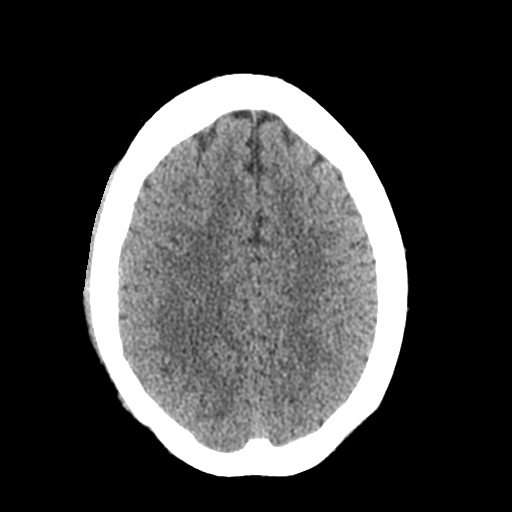
[im 21/29  brain]
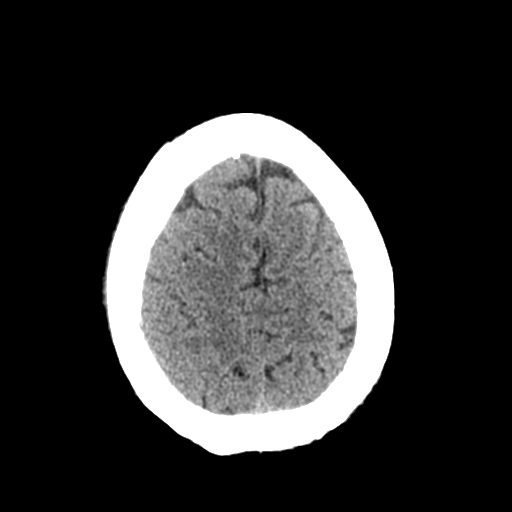
[im 24/29  brain]
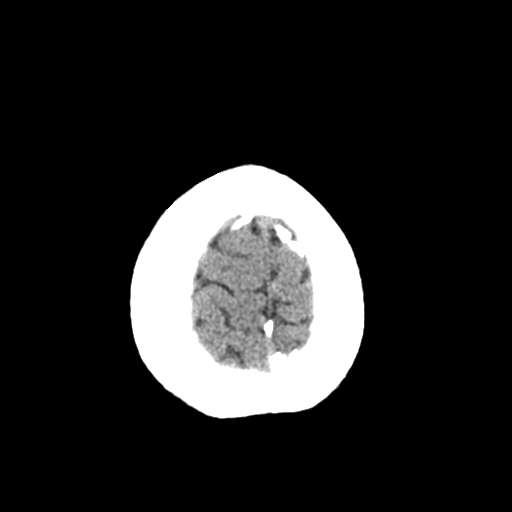
[im 27/29  brain]
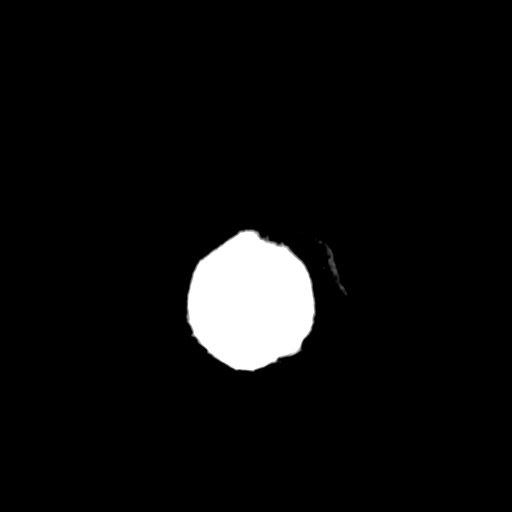
[im 27/29  bone]
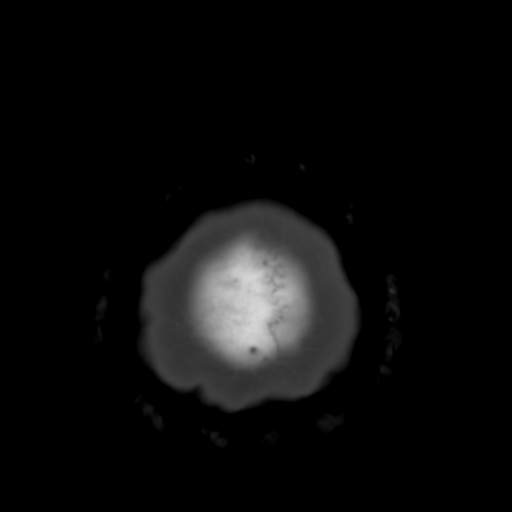

[Series 4: coronal soft · coronal · 0.31mm/px · 3 of 66 slices shown]
[im 22/66  brain]
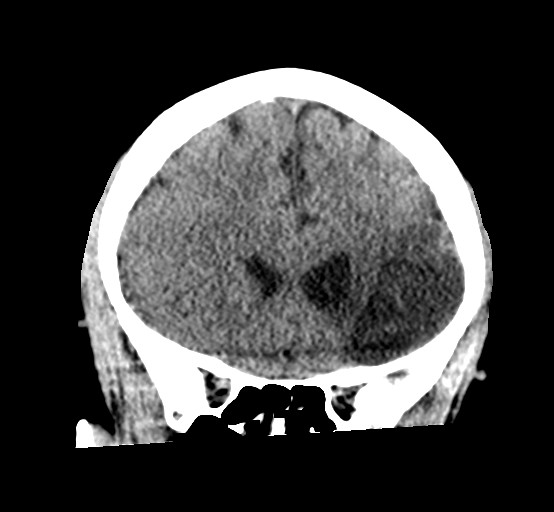
[im 29/66  brain]
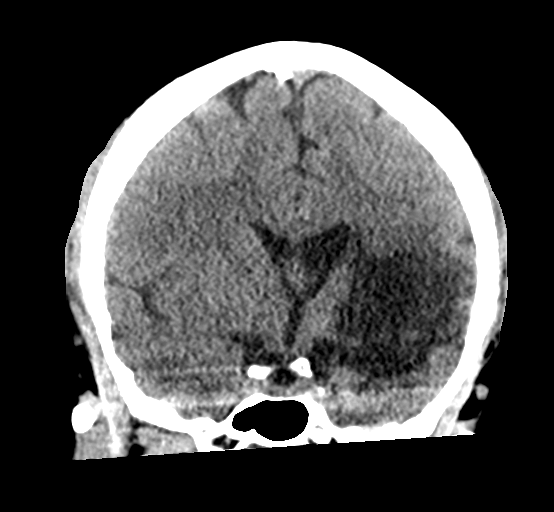
[im 37/66  brain]
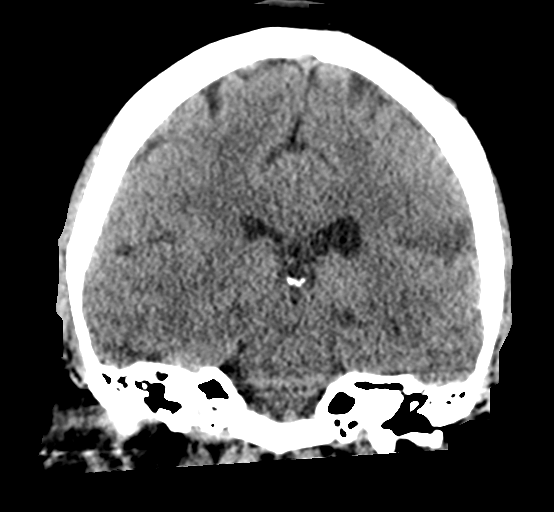

[Series 5: sagittal soft · sagittal · 0.30mm/px · 3 of 57 slices shown]
[im 19/57  brain]
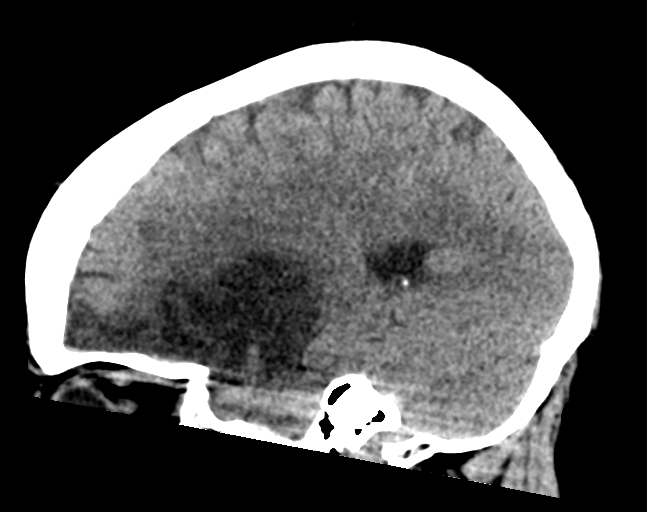
[im 29/57  brain]
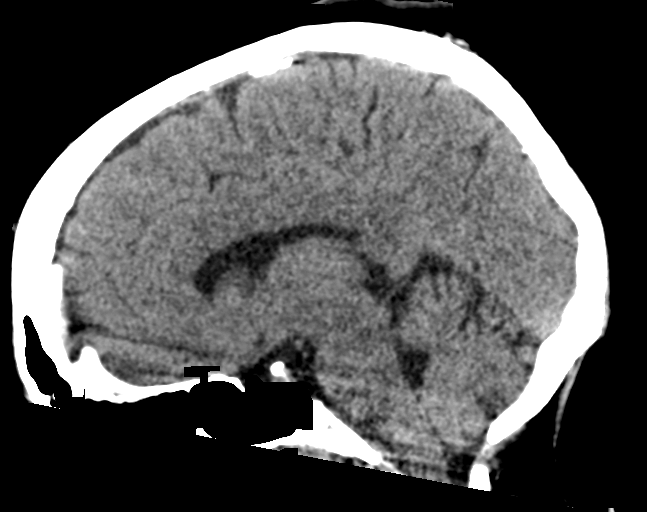
[im 38/57  brain]
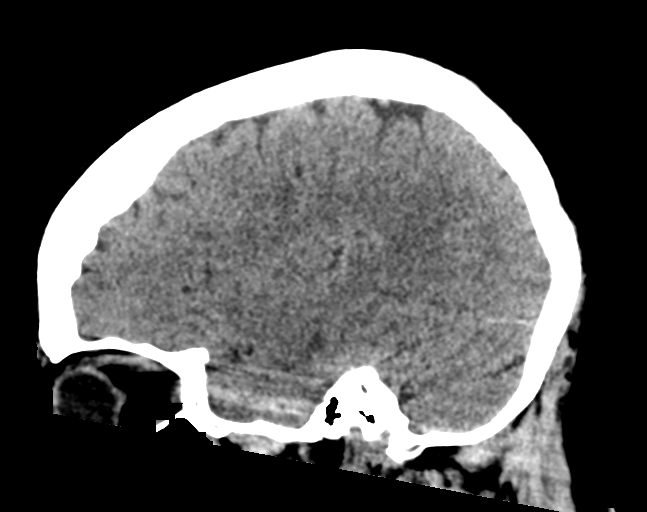

[15 of 46 positions shown; findings below may reference images not displayed]

FINDINGS: Brain: There is no evidence of an acute infarct, intracranial
hemorrhage, mass, midline shift, or extra-axial fluid collection. A
chronic moderately large left MCA infarct involving the frontal and
temporal lobes, insula, and basal ganglia with associated ex vacuo
dilatation of the frontal horn of the left lateral ventricle is
unchanged.

Vascular: No hyperdense vessel.

Skull: No fracture or suspicious osseous lesion.

Sinuses/Orbits: Visualized paranasal sinuses and mastoid air cells
are clear. Visualized orbits are unremarkable.

Other: None.
IMPRESSION: 1. No evidence of acute intracranial abnormality.
2. Chronic left MCA infarct.

## 2020-08-14 IMAGING — CT CT ABD-PELV W/ CM
4 of 5 series · 12 of 46 positions shown, 17 images · IV contrast (Omnipaque or Isovue)
Comparison: Chest CT dated [DATE].

CLINICAL DATA: 54-year-old female with concern for pulmonary
embolism.

EXAM:
CT ANGIOGRAPHY CHEST
CT ABDOMEN AND PELVIS WITH CONTRAST
TECHNIQUE: Multidetector CT imaging of the chest was performed using the
standard protocol during bolus administration of intravenous
contrast. Multiplanar CT image reconstructions and MIPs were
obtained to evaluate the vascular anatomy. Multidetector CT imaging
of the abdomen and pelvis was performed using the standard protocol
during bolus administration of intravenous contrast.
CONTRAST:  75mL OMNIPAQUE IOHEXOL 350 MG/ML SOLN

[Series 2: abd/pel w/ · axial · 0.72mm/px · z∈[+1025,+1330]mm · 6 of 87 slices shown, 11 images]
[im 13/87  soft-tissue]
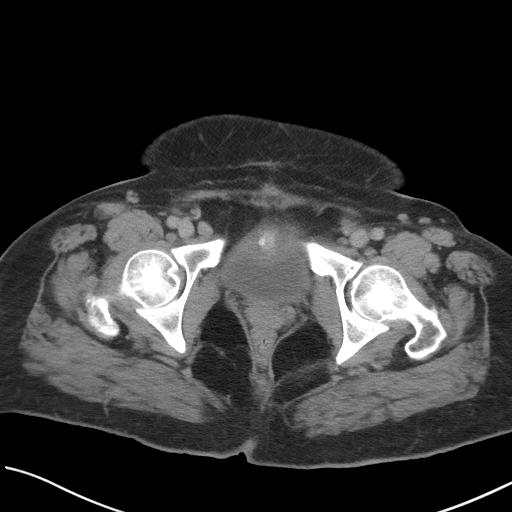
[im 13/87  bone]
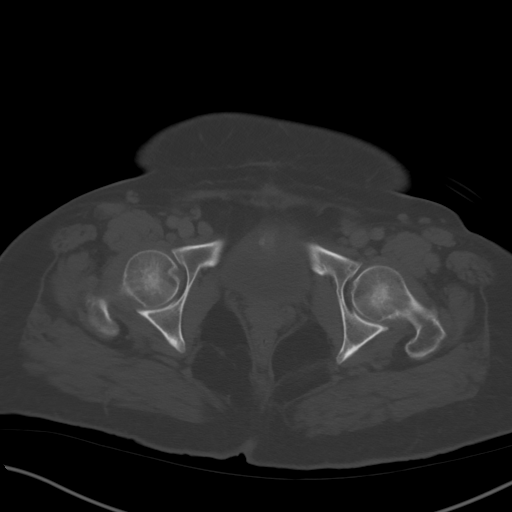
[im 25/87  soft-tissue]
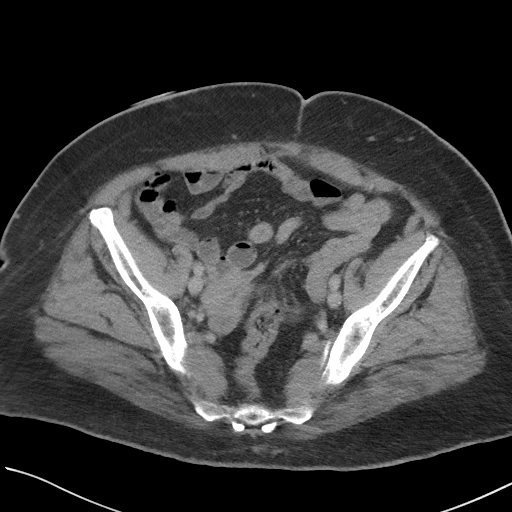
[im 37/87  soft-tissue]
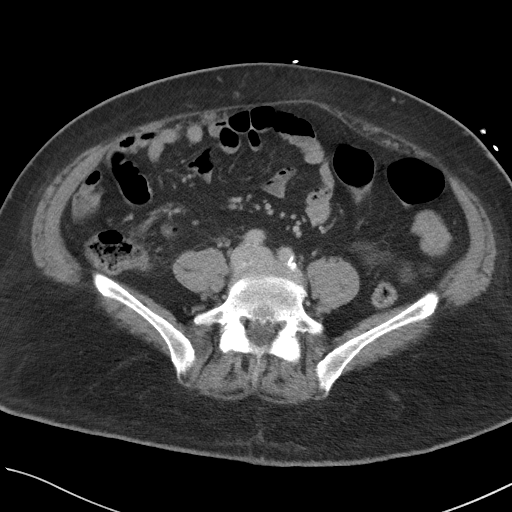
[im 37/87  lung]
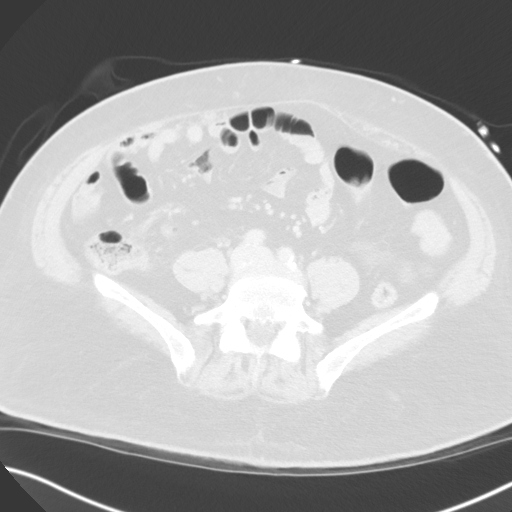
[im 50/87  soft-tissue]
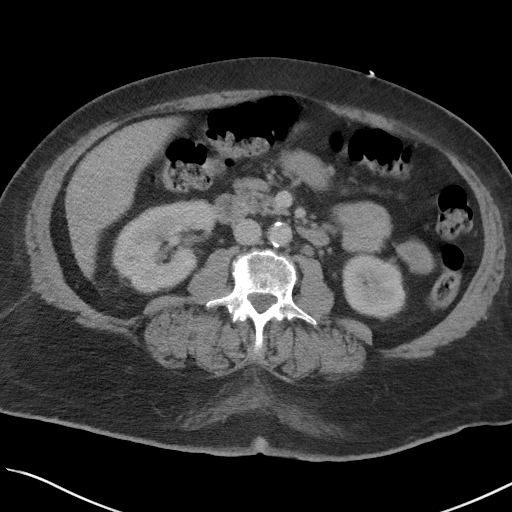
[im 50/87  lung]
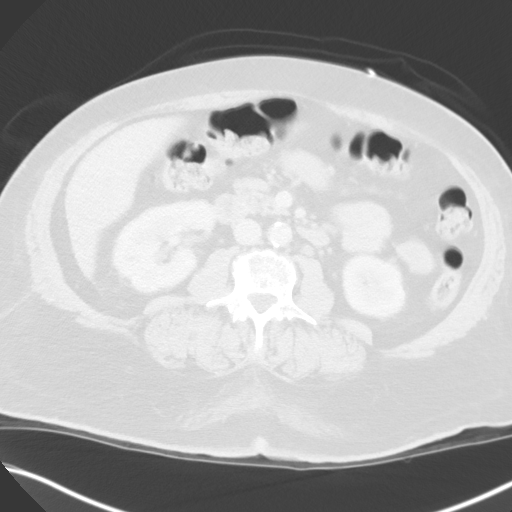
[im 62/87  soft-tissue]
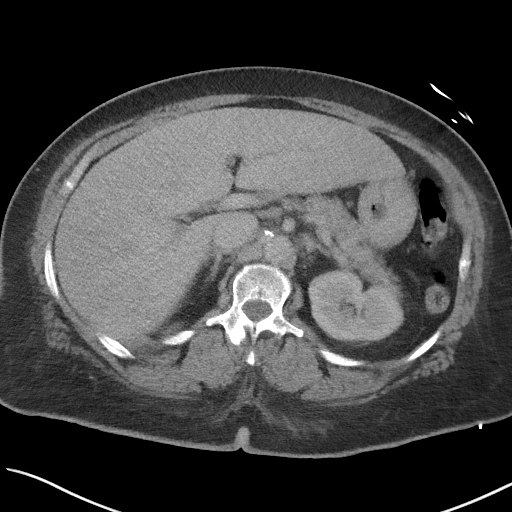
[im 62/87  lung]
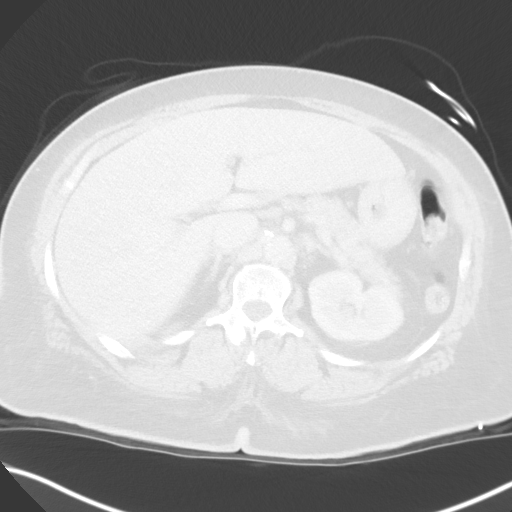
[im 74/87  soft-tissue]
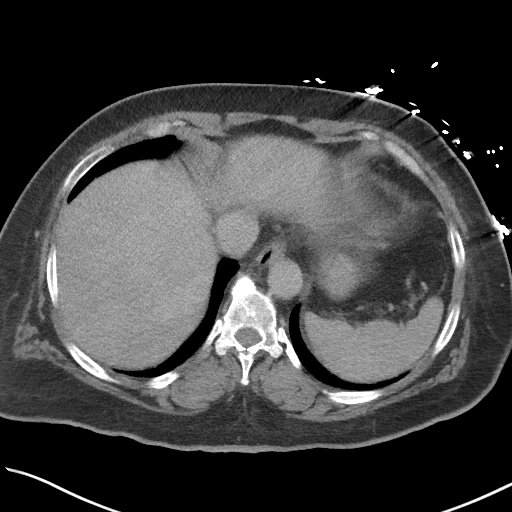
[im 74/87  lung]
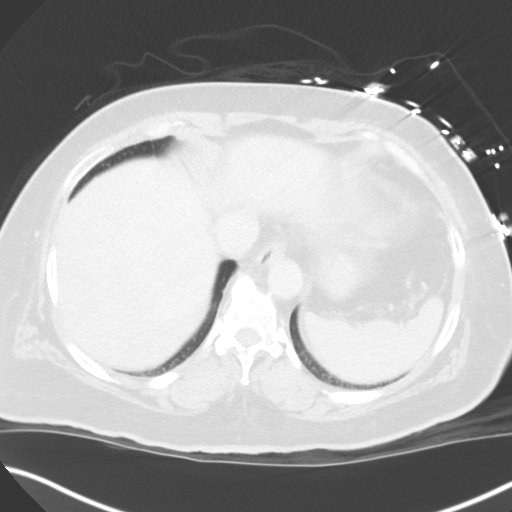

[Series 4: coronal · coronal · 0.65mm/px · 3 of 101 slices shown]
[im 34/101  soft-tissue]
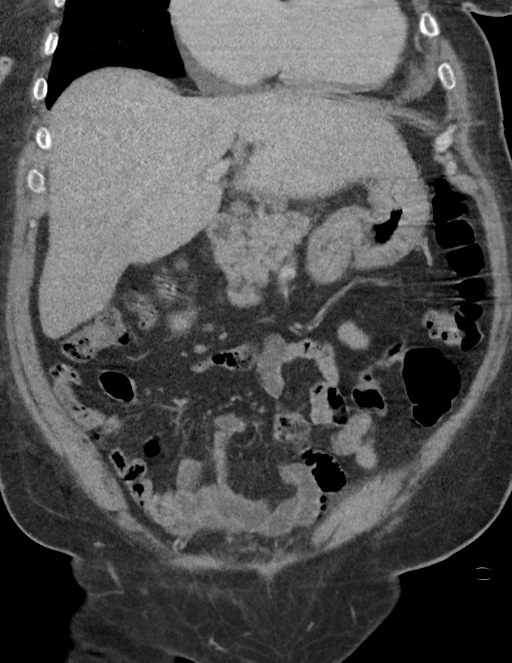
[im 45/101  soft-tissue]
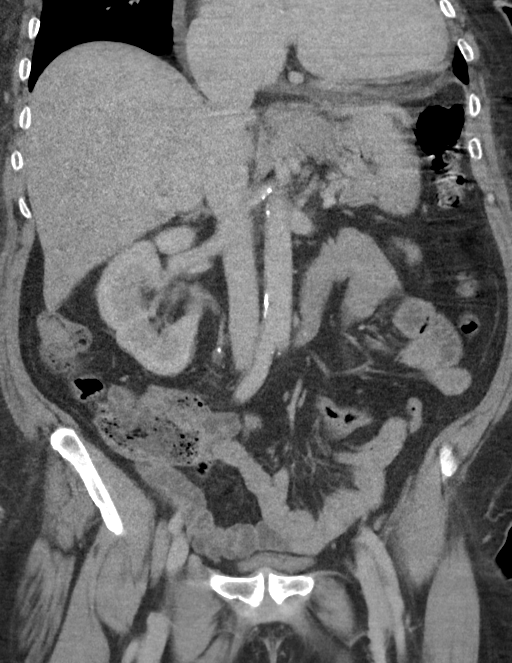
[im 56/101  soft-tissue]
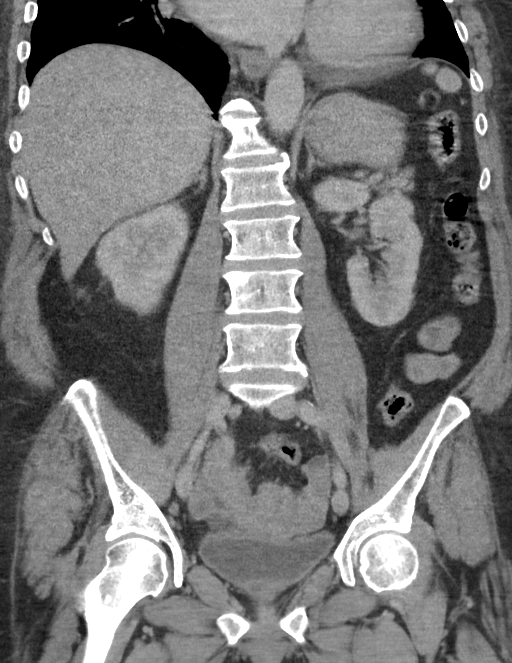

[Series 5: sagittal · sagittal · 0.65mm/px · 1 of 125 slices shown]
[im 42/125  soft-tissue]
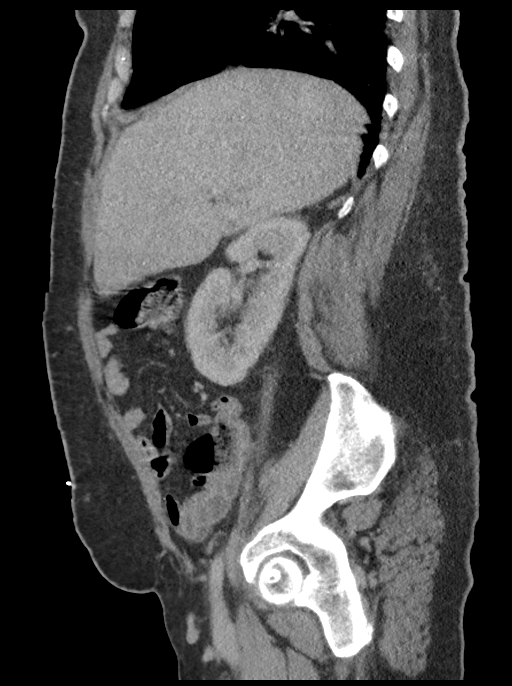

[Series 7: delays · axial · 0.73mm/px · z∈[+1220,+1290]mm · 2 of 44 slices shown]
[im 15/44  soft-tissue]
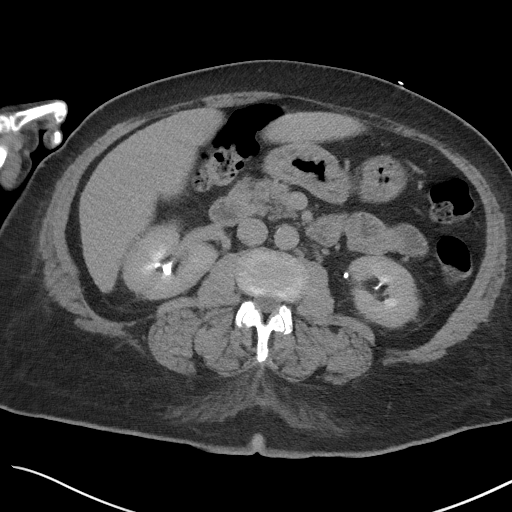
[im 29/44  soft-tissue]
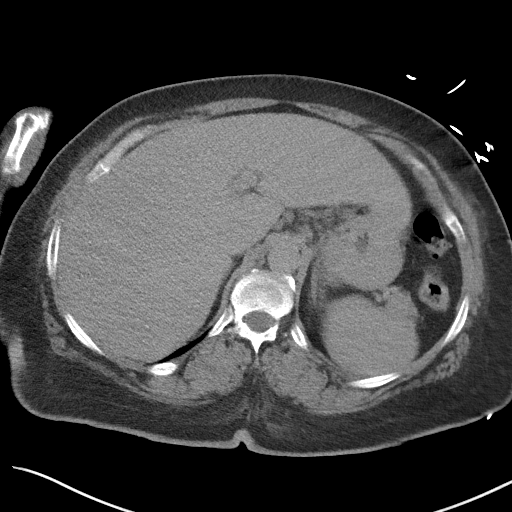

[12 of 46 positions shown; findings below may reference images not displayed]

FINDINGS: CTA CHEST FINDINGS

Cardiovascular: Moderate cardiomegaly. There is a small pericardial
effusion measuring 15 mm in thickness along the left ventricle.
There is retrograde flow of contrast from the right atrium into the
IVC suggestive of right heart dysfunction. The thoracic aorta is
unremarkable. Evaluation of the pulmonary arteries is limited due to
respiratory motion artifact. No pulmonary artery embolus identified.

Mediastinum/Nodes: Mildly enlarged bilateral hilar and mediastinal
lymph nodes, reactive. The esophagus and thyroid gland are grossly
unremarkable. No mediastinal fluid collection.

Lungs/Pleura: Bilateral confluent nodular and ground-glass opacities
consistent with multifocal pneumonia. There is no pleural effusion
or pneumothorax. The central airways are patent.

Musculoskeletal: No chest wall abnormality. No acute or significant
osseous findings.

Review of the MIP images confirms the above findings.

CT ABDOMEN and PELVIS FINDINGS

No intra-abdominal free air or free fluid.

Hepatobiliary: Probable background of mild fatty liver. No
intrahepatic biliary dilatation. Cholecystectomy.

Pancreas: Unremarkable. No pancreatic ductal dilatation or
surrounding inflammatory changes.

Spleen: Normal in size without focal abnormality.

Adrenals/Urinary Tract: The adrenal glands unremarkable. The
kidneys, visualized ureters, and urinary bladder appear
unremarkable.

Stomach/Bowel: There is no bowel obstruction or active inflammation.
The appendix is normal.

Vascular/Lymphatic: Mild aortoiliac atherosclerotic disease. The IVC
is unremarkable. No portal venous gas. There is no adenopathy.

Reproductive: Hysterectomy. No adnexal masses.

Other: None

Musculoskeletal: Degenerative changes of the spine. No acute osseous
pathology.

Review of the MIP images confirms the above findings.
IMPRESSION: 1. No CT evidence of pulmonary embolism.
2. Multifocal pneumonia. Clinical correlation and follow-up to
resolution recommended.
3. Moderate cardiomegaly with a small pericardial effusion.
4. No acute intra-abdominal or pelvic pathology. No bowel
obstruction. Normal appendix.
5. Aortic Atherosclerosis ([SU]-[SU]).

## 2020-08-14 IMAGING — DX DG CHEST 1V PORT
1 series · 1 of 1 positions shown · non-contrast
Comparison: Chest radiograph dated [DATE]

CLINICAL DATA: 54-year-old female with sepsis.

EXAM:
PORTABLE CHEST 1 VIEW

[chest ap]
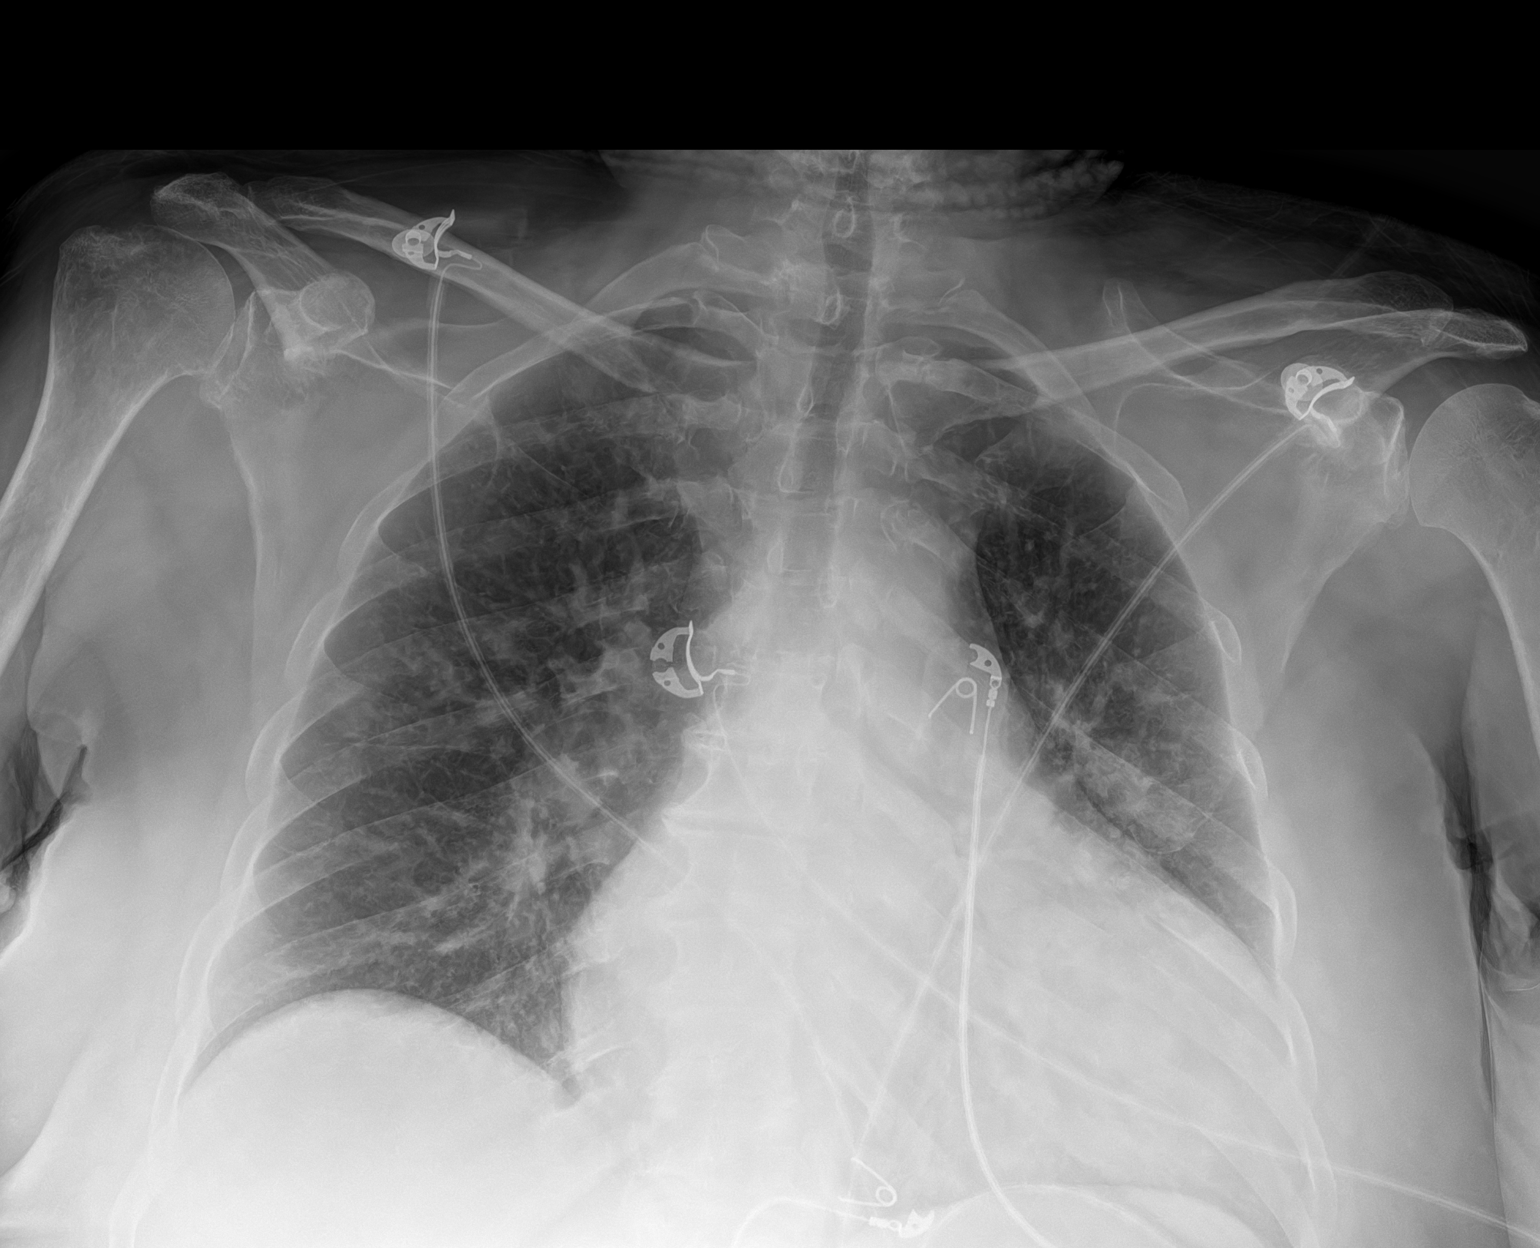

[1 of 1 positions shown; findings below may reference images not displayed]

FINDINGS: There is cardiomegaly with vascular congestion and probable mild
edema. Atypical infection is not excluded clinical correlation is
recommended. No focal consolidation, pleural effusion or
pneumothorax. No acute osseous pathology.
IMPRESSION: Cardiomegaly with vascular congestion and probable mild edema.

## 2020-08-14 MED ORDER — SODIUM CHLORIDE 0.9 % IV SOLN
500.0000 mg | INTRAVENOUS | Status: DC
  Administered 2020-08-14 – 2020-08-15 (×2): 500 mg via INTRAVENOUS
  Filled 2020-08-14 (×2): qty 500

## 2020-08-14 MED ORDER — IOHEXOL 350 MG/ML SOLN
75.0000 mL | Freq: Once | INTRAVENOUS | Status: AC | PRN
  Administered 2020-08-14: 75 mL via INTRAVENOUS

## 2020-08-14 MED ORDER — INSULIN GLARGINE 100 UNIT/ML ~~LOC~~ SOLN
10.0000 [IU] | Freq: Every day | SUBCUTANEOUS | Status: DC
  Administered 2020-08-15: 10 [IU] via SUBCUTANEOUS
  Filled 2020-08-14 (×3): qty 0.1

## 2020-08-14 MED ORDER — DILTIAZEM LOAD VIA INFUSION
20.0000 mg | Freq: Once | INTRAVENOUS | Status: AC
  Administered 2020-08-14: 20 mg via INTRAVENOUS
  Filled 2020-08-14: qty 20

## 2020-08-14 MED ORDER — SODIUM CHLORIDE 0.9 % IV SOLN: INTRAVENOUS | Status: DC

## 2020-08-14 MED ORDER — GABAPENTIN 100 MG PO CAPS
100.0000 mg | ORAL_CAPSULE | Freq: Three times a day (TID) | ORAL | Status: DC
  Administered 2020-08-14 – 2020-08-16 (×5): 100 mg via ORAL
  Filled 2020-08-14 (×5): qty 1

## 2020-08-14 MED ORDER — ACETAMINOPHEN 650 MG RE SUPP
650.0000 mg | Freq: Once | RECTAL | Status: AC
  Administered 2020-08-14: 650 mg via RECTAL
  Filled 2020-08-14: qty 1

## 2020-08-14 MED ORDER — ROSUVASTATIN CALCIUM 20 MG PO TABS
20.0000 mg | ORAL_TABLET | Freq: Every day | ORAL | Status: DC
  Administered 2020-08-15 – 2020-08-16 (×2): 20 mg via ORAL
  Filled 2020-08-14 (×2): qty 1

## 2020-08-14 MED ORDER — LACTATED RINGERS IV SOLN: INTRAVENOUS | Status: AC

## 2020-08-14 MED ORDER — HYDRALAZINE HCL 25 MG PO TABS
25.0000 mg | ORAL_TABLET | Freq: Three times a day (TID) | ORAL | Status: DC
  Administered 2020-08-14 – 2020-08-16 (×5): 25 mg via ORAL
  Filled 2020-08-14 (×6): qty 1

## 2020-08-14 MED ORDER — LACTATED RINGERS IV BOLUS (SEPSIS)
1000.0000 mL | Freq: Once | INTRAVENOUS | Status: AC
  Administered 2020-08-14: 1000 mL via INTRAVENOUS

## 2020-08-14 MED ORDER — CARVEDILOL 12.5 MG PO TABS
25.0000 mg | ORAL_TABLET | Freq: Two times a day (BID) | ORAL | Status: DC
  Administered 2020-08-15 – 2020-08-16 (×3): 25 mg via ORAL
  Filled 2020-08-14 (×3): qty 2

## 2020-08-14 MED ORDER — DILTIAZEM HCL-DEXTROSE 125-5 MG/125ML-% IV SOLN (PREMIX)
5.0000 mg/h | INTRAVENOUS | Status: DC
  Administered 2020-08-14: 5 mg/h via INTRAVENOUS
  Administered 2020-08-15: 15 mg/h via INTRAVENOUS
  Filled 2020-08-14 (×2): qty 125

## 2020-08-14 MED ORDER — ISOSORBIDE MONONITRATE ER 60 MG PO TB24
60.0000 mg | ORAL_TABLET | Freq: Every day | ORAL | Status: DC
  Administered 2020-08-15 – 2020-08-16 (×2): 60 mg via ORAL
  Filled 2020-08-14 (×2): qty 1

## 2020-08-14 MED ORDER — APIXABAN 5 MG PO TABS
5.0000 mg | ORAL_TABLET | Freq: Two times a day (BID) | ORAL | Status: DC
  Administered 2020-08-14 – 2020-08-16 (×4): 5 mg via ORAL
  Filled 2020-08-14 (×4): qty 1

## 2020-08-14 MED ORDER — CITALOPRAM HYDROBROMIDE 20 MG PO TABS
10.0000 mg | ORAL_TABLET | Freq: Every day | ORAL | Status: DC
  Administered 2020-08-15 – 2020-08-16 (×2): 10 mg via ORAL
  Filled 2020-08-14 (×2): qty 1

## 2020-08-14 MED ORDER — CEFTRIAXONE SODIUM 2 G IJ SOLR
2.0000 g | INTRAMUSCULAR | Status: DC
  Administered 2020-08-15: 2 g via INTRAVENOUS
  Filled 2020-08-14: qty 20

## 2020-08-14 MED ORDER — INSULIN ASPART 100 UNIT/ML ~~LOC~~ SOLN
0.0000 [IU] | Freq: Three times a day (TID) | SUBCUTANEOUS | Status: DC
  Administered 2020-08-15: 11 [IU] via SUBCUTANEOUS
  Administered 2020-08-15 – 2020-08-16 (×3): 3 [IU] via SUBCUTANEOUS
  Administered 2020-08-16: 5 [IU] via SUBCUTANEOUS
  Filled 2020-08-14: qty 1

## 2020-08-14 MED ORDER — IBUPROFEN 800 MG PO TABS
800.0000 mg | ORAL_TABLET | Freq: Once | ORAL | Status: AC
  Administered 2020-08-14: 800 mg via ORAL
  Filled 2020-08-14: qty 1

## 2020-08-14 MED ORDER — ENSURE ENLIVE PO LIQD
237.0000 mL | Freq: Two times a day (BID) | ORAL | Status: DC
  Administered 2020-08-15 – 2020-08-16 (×4): 237 mL via ORAL
  Filled 2020-08-14 (×2): qty 237

## 2020-08-14 MED ORDER — INSULIN ASPART 100 UNIT/ML ~~LOC~~ SOLN
0.0000 [IU] | Freq: Every day | SUBCUTANEOUS | Status: DC
  Administered 2020-08-14 – 2020-08-15 (×2): 2 [IU] via SUBCUTANEOUS

## 2020-08-14 MED ORDER — FUROSEMIDE 20 MG PO TABS
20.0000 mg | ORAL_TABLET | Freq: Every day | ORAL | Status: DC
  Administered 2020-08-15 – 2020-08-16 (×2): 20 mg via ORAL
  Filled 2020-08-14 (×2): qty 1

## 2020-08-14 MED ORDER — NITROGLYCERIN 0.4 MG SL SUBL: 0.4000 mg | SUBLINGUAL_TABLET | SUBLINGUAL | Status: DC | PRN

## 2020-08-14 MED ORDER — SODIUM CHLORIDE 0.9 % IV SOLN: 500.0000 mg | INTRAVENOUS | Status: DC

## 2020-08-14 MED ORDER — ASPIRIN EC 81 MG PO TBEC
81.0000 mg | DELAYED_RELEASE_TABLET | Freq: Every day | ORAL | Status: DC
  Administered 2020-08-15 – 2020-08-16 (×2): 81 mg via ORAL
  Filled 2020-08-14 (×2): qty 1

## 2020-08-14 MED ORDER — SODIUM CHLORIDE 0.9 % IV SOLN
1.0000 g | Freq: Once | INTRAVENOUS | Status: AC
  Administered 2020-08-14: 1 g via INTRAVENOUS
  Filled 2020-08-14: qty 10

## 2020-08-14 NOTE — Sepsis Progress Note (Signed)
Sepsis protocol being followed by [facility] 
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
## 2020-08-14 NOTE — ED Triage Notes (Signed)
Pt brought to ED via [facility] for fever and family concerned about pt not eating and drinking the last couple of days, not getting out of bed. Pt with hx of stroke with aphasia and right sided deficits.

## 2020-08-14 NOTE — H&P (Addendum)
TRH H&P    Patient Demographics:    Robin [name], is a 55 y.o. female  MRN: [id number]  DOB - 03/21/1966  Admit Date - 08/14/2020  Referring MD/NP/PA: [facility] Primary MD for the patient is Inda Coke, [place]  Patient coming from: Home  Chief complaint-fever   HPI:    Robin [name]  is a 55 y.o. female, with history of type 2 diabetes mellitus, thoracic ascending aortic aneurysm, MCA stroke with right-sided weakness, GERD, essential hypertension, COPD, history of substance abuse, chronic atrial fibrillation, CHF, anemia, and more presents the ED with a chief complaint of fever.  Unfortunately patient also has chronic expressive aphasia secondary to her previous CVA, so she was not able to give me any history.  She does shake her head no she is not in any pain at this time.  ED provider reports that family stated patient had had a fever for a week.  During this time she also had decrease in appetite and was barely taking any food or water, and was not getting out of bed.  Patient shakes her head yes that she has had a cough at home as well.  She does not respond when asked if the cough was productive.  Family does not know of any other associated symptoms per ED provider report.  ED provider also reports that, per family, patient is able to speak intermittently, but the garbled speech is normal for her as well.  ED provider reports that she witnesses herself, when she went into the room 1 time and patient was not able to speak clearly, and the next time patient said when can I eat?  And then upon my exam patient was only able to have garbled speech.  Patient is vaccinated, does not have booster.   In the ED T-max 103.1, heart rate 57-1 53, respiratory 23, blood pressure 127/77, maintaining sats on room air White blood cell count 10.9, hemoglobin 10.7 Chemistry panel reveals a slight bump in creatinine  to 1.83, baseline 1.7 also reveals hyperglycemia at 187 Patient was started on Rocephin, Zithromax Cardizem 20 mg push did not control heart rate Patient started on Cardizem drip 1 L bolus administered  CT head shows no evidence of acute intracranial abnormality, chronic left MCA infarct CT chest and abdomen shows no CT evidence of pulmonary embolism.  Multifocal pneumonia.  Moderate cardiomegaly with small pericardial effusion.  No acute intra-abdominal or pelvic pathology, no bowel obstruction, normal appendix   Review of systems:    Review of Systems  Unable to perform ROS: Other  Neurological: Speech change:    Expressive aphasia     Past History of the following :    Past Medical History:  Diagnosis Date  . A-fib ([place])   . Allergy   . Anemia   . Arthritis   . CHF (congestive heart failure) ([place])    a. EF at 30-35% by echo in 08/2018 b. EF at 35% by repeat echo in 05/2019  . Chronic abdominal pain   . Chronic atrial fibrillation ([employer])   .  Cocaine abuse ([place])   . COPD (chronic obstructive pulmonary disease) ([place])   . Essential hypertension, benign   . Expressive aphasia   . Expressive aphasia    post CVA  . Fatty liver   . GERD (gastroesophageal reflux disease)   . Gout 2016  . Normal coronary arteries    3/10 - following abnormal Myoview  . Ovarian cyst   . Stroke ([place]) 12/26/2019   Right sided weakness, and expressive aphasia  . Thoracic ascending aortic aneurysm ([facility])    4 cm 10/31/19 CTA  . Type 2 diabetes mellitus ([place])    type II      Past Surgical History:  Procedure Laterality Date  . ABDOMINAL HYSTERECTOMY  09/10/2011   Procedure: HYSTERECTOMY ABDOMINAL;  Surgeon: Jonnie Kind, MD;  Location: [facility];  Service: Gynecology;  Laterality: N/A;  Abdominal hysterectomy  . CESAREAN SECTION  [id number], and 1994  . CHOLECYSTECTOMY  1995  . IR 3D INDEPENDENT WKST  03/16/2020  . IR ANGIO INTRA EXTRACRAN SEL INTERNAL CAROTID BILAT MOD SED  03/16/2020  . IR  ANGIO VERTEBRAL SEL SUBCLAVIAN INNOMINATE UNI L MOD SED  03/16/2020  . IR ANGIO VERTEBRAL SEL VERTEBRAL UNI R MOD SED  03/16/2020  . IR CT HEAD LTD  12/27/2019  . IR PERCUTANEOUS ART THROMBECTOMY/INFUSION INTRACRANIAL INC DIAG ANGIO  12/27/2019  . IR US GUIDE VASC ACCESS RIGHT  12/27/2019  . IR US GUIDE VASC ACCESS RIGHT  03/16/2020  . RADIOLOGY WITH ANESTHESIA N/A 12/27/2019   Procedure: IR WITH ANESTHESIA;  Surgeon: Luanne Bras, MD;  Location: [place];  Service: Radiology;  Laterality: N/A;  . SCAR REVISION  09/10/2011   Procedure: SCAR REVISION;  Surgeon: Jonnie Kind, MD;  Location: [facility];  Service: Gynecology;  Laterality: N/A;  Wide Excision of old Cicatrix  . TUBAL LIGATION  1994      Social History:      Social History   Tobacco Use  . Smoking status: Former Smoker    Years: 29.00    Types: Cigarettes    Quit date: 12/2019    Years since quitting: 0.6  . Smokeless tobacco: Never Used  Substance Use Topics  . Alcohol use: Not Currently    Comment: occ       Family History :     Family History  Problem Relation Age of Onset  . Cirrhosis Mother   . Early death Mother   . Alcohol abuse Mother   . Diabetes type II Father   . Alcohol abuse Father   . Diabetes type II Sister   . Early death Brother   . Alcohol abuse Son   . Anesthesia problems Neg Hx   . Hypotension Neg Hx   . Malignant hyperthermia Neg Hx   . Pseudochol deficiency Neg Hx   . Colon cancer Neg Hx   . Colon polyps Neg Hx   . Esophageal cancer Neg Hx   . Rectal cancer Neg Hx   . Stomach cancer Neg Hx       Home Medications:   Prior to Admission medications   Medication Sig Start Date End Date Taking? Authorizing Provider  acetaminophen (TYLENOL) 325 MG tablet Take 650 mg by mouth every 8 (eight) hours as needed for mild pain.    [provider]  apixaban (ELIQUIS) 5 MG TABS tablet Take 1 tablet (5 mg total) by mouth 2 (two) times daily. 07/19/20   Strader, Fransisco Hertz, PA-C   aspirin EC 81 MG  tablet Take 1 tablet (81 mg total) by mouth daily. Swallow whole. 07/19/20   Strader, Fransisco Hertz, PA-C  BD PEN NEEDLE NANO U/F 32G X 4 MM MISC Inject into the skin as directed. 02/01/20   [provider]  carvedilol (COREG) 25 MG tablet Take 1 tablet (25 mg total) by mouth 2 (two) times daily with a meal. Hold for HR<70, SBP<100 05/16/20   Manuella Ghazi, Pratik D, DO  citalopram (CELEXA) 10 MG tablet Take 1 tablet by mouth once daily 07/24/20   Inda Coke, PA  feeding supplement, ENSURE ENLIVE, (ENSURE ENLIVE) LIQD Take 237 mLs by mouth 2 (two) times daily between meals. 05/16/20   Manuella Ghazi, Pratik D, DO  furosemide (LASIX) 20 MG tablet Take 1 tablet (20 mg total) by mouth daily. 07/10/20 07/10/21  DanfordSuann Larry, MD  gabapentin (NEURONTIN) 100 MG capsule Take 1 capsule (100 mg total) by mouth 3 (three) times daily. 02/16/20   Inda Coke, PA  hydrALAZINE (APRESOLINE) 25 MG tablet Take 1 tablet (25 mg total) by mouth 3 (three) times daily. 02/29/20 02/28/21  Inda Coke, PA  insulin glargine (LANTUS) 100 UNIT/ML injection Inject 10 Units into the skin at bedtime. If over 250 give another 2 units    [provider]  isosorbide mononitrate (IMDUR) 30 MG 24 hr tablet Take 2 tablets (60 mg total) by mouth daily. 02/29/20 02/28/21  Inda Coke, PA  nitroGLYCERIN (NITROSTAT) 0.4 MG SL tablet Place 1 tablet (0.4 mg total) under the tongue every 5 (five) minutes as needed for chest pain. 02/01/20   Angiulli, Lavon Paganini, PA-C  pantoprazole (PROTONIX) 40 MG tablet Take 1 tablet (40 mg total) by mouth daily. 07/05/20 08/04/20  Inda Coke, PA  rosuvastatin (CRESTOR) 20 MG tablet Take 1 tablet by mouth once daily 07/24/20   Inda Coke, [place]     Allergies:     Allergies  Allergen Reactions  . Bee Venom Shortness Of Breath and Swelling    Bodily Swelling  . Losartan Other (See Comments)    Nosebleeds per patient report.   . Naproxen Other (See Comments)     Acid reflux  . Penicillins Nausea Only    Has patient had a PCN reaction causing immediate rash, facial/tongue/throat swelling, SOB or lightheadedness with hypotension: no Has patient had a PCN reaction causing severe rash involving mucus membranes or skin necrosis: no Has patient had a PCN reaction that required hospitalization no Has patient had a PCN reaction occurring within the last 10 years: no If all of the above answers are "NO", then may proceed with Cephalosporin   . Lisinopril     Sinus congestion     Physical Exam:   Vitals  Blood pressure 127/77, pulse (!) 104, temperature (!) 103.1 F (39.5 C), temperature source Rectal, resp. rate (!) 23, height 5\' 7"  (1.702 m), weight 87.7 kg, last menstrual period 08/21/2011, SpO2 96 %.  1.  General: Lying supine in bed in no acute distress  2. Psychiatric: Unable to assess as patient speech is jumbled  3. Neurologic: Right-sided weakness in the upper and lower extremity, expressive aphasia, she is able to move all 4 extremities voluntarily, extraocular muscles intact, pupils reactive  4. HEENMT:  Head is atraumatic, normocephalic, pupils are reactive to light neck is supple, trachea is midline, mucous membranes mildly dry  5. Respiratory : Lungs are clear to auscultation bilaterally  6. Cardiovascular : Heart rate is normal, rhythm is irregularly irregular, no murmurs, no peripheral edema  7. Gastrointestinal:  Abdomen is soft, nondistended, nontender to palpation  8. Skin:  No acute lesions on limited skin exam  9.Musculoskeletal:  No acute deformity, no peripheral edema    Data Review:    CBC Recent Labs  Lab 08/14/20 1701  WBC 10.9*  HGB 10.7*  HCT 35.2*  PLT 296  MCV 84.0  MCH 25.5*  MCHC 30.4  RDW 13.8  LYMPHSABS 1.8  MONOABS 0.5  EOSABS 0.1  BASOSABS 0.0   ------------------------------------------------------------------------------------------------------------------  Results for orders  placed or performed during the hospital encounter of 08/14/20 (from the past 48 hour(s))  Lactic acid, plasma     Status: None   Collection Time: 08/14/20  5:01 PM  Result Value Ref Range   Lactic Acid, Venous 1.9 0.5 - 1.9 mmol/L    Comment: Performed at [facility], [address], [place], [name] [address]  Comprehensive metabolic panel     Status: Abnormal   Collection Time: 08/14/20  5:01 PM  Result Value Ref Range   Sodium 135 135 - 145 mmol/L   Potassium 3.6 3.5 - 5.1 mmol/L   Chloride 104 98 - 111 mmol/L   CO2 22 22 - 32 mmol/L   Glucose, Bld 187 (H) 70 - 99 mg/dL    Comment: Glucose reference range applies only to samples taken after fasting for at least 8 hours.   BUN 20 6 - 20 mg/dL   Creatinine, Ser 2.91 (H) 0.44 - 1.00 mg/dL   Calcium 9.0 8.9 - 91.6 mg/dL   Total Protein 7.6 6.5 - 8.1 g/dL   Albumin 3.9 3.5 - 5.0 g/dL   AST 15 15 - 41 U/L   ALT 11 0 - 44 U/L   Alkaline Phosphatase 85 38 - 126 U/L   Total Bilirubin 0.6 0.3 - 1.2 mg/dL   GFR, Estimated 32 (L) >60 mL/min    Comment: (NOTE) Calculated using the CKD-EPI Creatinine Equation (2021)    Anion gap 9 5 - 15    Comment: Performed at [facility], [address], [place], [name] [address]  CBC WITH DIFFERENTIAL     Status: Abnormal   Collection Time: 08/14/20  5:01 PM  Result Value Ref Range   WBC 10.9 (H) 4.0 - 10.5 K/uL   RBC 4.19 3.87 - 5.11 MIL/uL   Hemoglobin 10.7 (L) 12.0 - 15.0 g/dL   HCT 45.9 (L) 97.7 - 41.4 %   MCV 84.0 80.0 - 100.0 fL   MCH 25.5 (L) 26.0 - 34.0 pg   MCHC 30.4 30.0 - 36.0 g/dL   RDW 23.9 53.2 - 02.3 %   Platelets 296 150 - 400 K/uL   nRBC 0.0 0.0 - 0.2 %   Neutrophils Relative % 77 %   Neutro Abs 8.4 (H) 1.7 - 7.7 K/uL   Lymphocytes Relative 17 %   Lymphs Abs 1.8 0.7 - 4.0 K/uL   Monocytes Relative 5 %   Monocytes Absolute 0.5 0.1 - 1.0 K/uL   Eosinophils Relative 1 %   Eosinophils Absolute 0.1 0.0 - 0.5 K/uL   Basophils Relative 0 %   Basophils Absolute 0.0 0.0 - 0.1  K/uL   Immature Granulocytes 0 %   Abs Immature Granulocytes 0.03 0.00 - 0.07 K/uL    Comment: Performed at [facility], [address], [place], [name] [address]  Protime-INR     Status: Abnormal   Collection Time: 08/14/20  5:01 PM  Result Value Ref Range   Prothrombin Time 16.3 (H) 11.4 - 15.2 seconds  INR 1.4 (H) 0.8 - 1.2    Comment: (NOTE) INR goal varies based on device and disease states. Performed at [facility], [address], [place], [place] [address]   APTT     Status: None   Collection Time: 08/14/20  5:01 PM  Result Value Ref Range   aPTT 29 24 - 36 seconds    Comment: Performed at [facility], [address], [place], [place] [address]  Blood Culture (routine x 2)     Status: None (Preliminary result)   Collection Time: 08/14/20  5:01 PM   Specimen: BLOOD LEFT FOREARM  Result Value Ref Range   Specimen Description BLOOD LEFT FOREARM    Special Requests      BOTTLES DRAWN AEROBIC AND ANAEROBIC Blood Culture adequate volume Performed at [facility], [address] [place], [place] [address]    Culture PENDING    Report Status PENDING   Urinalysis, Routine w reflex microscopic Urine, Clean Catch     Status: Abnormal   Collection Time: 08/14/20  5:01 PM  Result Value Ref Range   Color, Urine YELLOW YELLOW   APPearance CLEAR CLEAR   Specific Gravity, Urine 1.013 1.005 - 1.030   pH 6.0 5.0 - 8.0   Glucose, UA 50 (A) NEGATIVE mg/dL   Hgb urine dipstick NEGATIVE NEGATIVE   Bilirubin Urine NEGATIVE NEGATIVE   Ketones, ur NEGATIVE NEGATIVE mg/dL   Protein, ur 30 (A) NEGATIVE mg/dL   Nitrite NEGATIVE NEGATIVE   Leukocytes,Ua SMALL (A) NEGATIVE   RBC / HPF 0-5 0 - 5 RBC/hpf   WBC, UA 11-20 0 - 5 WBC/hpf   Bacteria, UA FEW (A) NONE SEEN   Squamous Epithelial / LPF 0-5 0 - 5   Mucus PRESENT     Comment: Performed at [facility], [address] [place], [place] [address]  Rapid urine drug screen (hospital performed)     Status: None   Collection Time: 08/14/20   5:01 PM  Result Value Ref Range   Opiates NONE DETECTED NONE DETECTED   Cocaine NONE DETECTED NONE DETECTED   Benzodiazepines NONE DETECTED NONE DETECTED   Amphetamines NONE DETECTED NONE DETECTED   Tetrahydrocannabinol NONE DETECTED NONE DETECTED   Barbiturates NONE DETECTED NONE DETECTED    Comment: (NOTE) DRUG SCREEN FOR MEDICAL PURPOSES ONLY.  IF CONFIRMATION IS NEEDED FOR ANY PURPOSE, NOTIFY LAB WITHIN 5 DAYS.  LOWEST DETECTABLE LIMITS FOR URINE DRUG SCREEN Drug Class                     Cutoff (ng/mL) Amphetamine and metabolites    1000 Barbiturate and metabolites    200 Benzodiazepine                 [id number] Tricyclics and metabolites     300 Opiates and metabolites        300 Cocaine and metabolites        300 THC                            50 Performed at [facility], [address], [place], [place] [address]   Magnesium     Status: None   Collection Time: 08/14/20  5:01 PM  Result Value Ref Range   Magnesium 1.9 1.7 - 2.4 mg/dL    Comment: Performed at [facility], [address], [place],  [address]  Resp Panel by RT-PCR (Flu A&B, Covid) Nasopharyngeal Swab  Status: None   Collection Time: 08/14/20  5:02 PM   Specimen: Nasopharyngeal Swab; Nasopharyngeal(NP) swabs in vial transport medium  Result Value Ref Range   SARS Coronavirus 2 by RT PCR NEGATIVE NEGATIVE    Comment: (NOTE) SARS-CoV-2 target nucleic acids are NOT DETECTED.  The SARS-CoV-2 RNA is generally detectable in upper respiratory specimens during the acute phase of infection. The lowest concentration of SARS-CoV-2 viral copies this assay can detect is 138 copies/mL. A negative result does not preclude SARS-Cov-2 infection and should not be used as the sole basis for treatment or other patient management decisions. A negative result may occur with  improper specimen collection/handling, submission of specimen other than nasopharyngeal swab, presence of viral mutation(s) within the areas  targeted by this assay, and inadequate number of viral copies(<138 copies/mL). A negative result must be combined with clinical observations, patient history, and epidemiological information. The expected result is Negative.  Fact Sheet for Patients:  EntrepreneurPulse.com.au  Fact Sheet for Healthcare Providers:  IncredibleEmployment.be  This test is no t yet approved or cleared by the [place] FDA and  has been authorized for detection and/or diagnosis of SARS-CoV-2 by FDA under an Emergency Use Authorization (EUA). This EUA will remain  in effect (meaning this test can be used) for the duration of the COVID-19 declaration under Section 564(b)(1) of the Act, 21 U.S.C.section 360bbb-3(b)(1), unless the authorization is terminated  or revoked sooner.       Influenza A by PCR NEGATIVE NEGATIVE   Influenza B by PCR NEGATIVE NEGATIVE    Comment: (NOTE) The Xpert Xpress SARS-CoV-2/FLU/RSV plus assay is intended as an aid in the diagnosis of influenza from Nasopharyngeal swab specimens and should not be used as a sole basis for treatment. Nasal washings and aspirates are unacceptable for Xpert Xpress SARS-CoV-2/FLU/RSV testing.  Fact Sheet for Patients: EntrepreneurPulse.com.au  Fact Sheet for Healthcare Providers: IncredibleEmployment.be  This test is not yet approved or cleared by the [place] FDA and has been authorized for detection and/or diagnosis of SARS-CoV-2 by FDA under an Emergency Use Authorization (EUA). This EUA will remain in effect (meaning this test can be used) for the duration of the COVID-19 declaration under Section 564(b)(1) of the Act, 21 U.S.C. section 360bbb-3(b)(1), unless the authorization is terminated or revoked.  Performed at [facility], [address] [place], [place] [address]   Lactic acid, plasma     Status: None   Collection Time: 08/14/20  7:22 PM  Result  Value Ref Range   Lactic Acid, Venous 1.1 0.5 - 1.9 mmol/L    Comment: Performed at [facility], [address], [place], [place] [address]  Blood Culture (routine x 2)     Status: None (Preliminary result)   Collection Time: 08/14/20  7:22 PM   Specimen: BLOOD LEFT HAND  Result Value Ref Range   Specimen Description BLOOD LEFT HAND    Special Requests      BOTTLES DRAWN AEROBIC AND ANAEROBIC Blood Culture adequate volume Performed at [facility], [address], [place], [place] [address]    Culture PENDING    Report Status PENDING     Chemistries  Recent Labs  Lab 08/14/20 1701  NA 135  K 3.6  CL 104  CO2 22  GLUCOSE 187*  BUN 20  CREATININE 1.83*  CALCIUM 9.0  MG 1.9  AST 15  ALT 11  ALKPHOS 85  BILITOT 0.6   ------------------------------------------------------------------------------------------------------------------  ------------------------------------------------------------------------------------------------------------------ GFR: Estimated Creatinine Clearance: 39.9 mL/min (A) (by  C-G formula based on SCr of 1.83 mg/dL (H)). Liver Function Tests: Recent Labs  Lab 08/14/20 1701  AST 15  ALT 11  ALKPHOS 85  BILITOT 0.6  PROT 7.6  ALBUMIN 3.9   No results for input(s): LIPASE, AMYLASE in the last 168 hours. No results for input(s): AMMONIA in the last 168 hours. Coagulation Profile: Recent Labs  Lab 08/14/20 1701  INR 1.4*   Cardiac Enzymes: No results for input(s): CKTOTAL, CKMB, CKMBINDEX, TROPONINI in the last 168 hours. BNP (last 3 results) No results for input(s): PROBNP in the last 8760 hours. HbA1C: No results for input(s): HGBA1C in the last 72 hours. CBG: No results for input(s): GLUCAP in the last 168 hours. Lipid Profile: No results for input(s): CHOL, HDL, LDLCALC, TRIG, CHOLHDL, LDLDIRECT in the last 72 hours. Thyroid Function Tests: No results for input(s): TSH, T4TOTAL, FREET4, T3FREE, THYROIDAB in the last 72  hours. Anemia Panel: No results for input(s): VITAMINB12, FOLATE, FERRITIN, TIBC, IRON, RETICCTPCT in the last 72 hours.  --------------------------------------------------------------------------------------------------------------- Urine analysis:    Component Value Date/Time   COLORURINE YELLOW 08/14/2020 1701   APPEARANCEUR CLEAR 08/14/2020 1701   LABSPEC 1.013 08/14/2020 1701   PHURINE 6.0 08/14/2020 1701   GLUCOSEU 50 (A) 08/14/2020 1701   GLUCOSEU >=1000 (A) 09/21/2018 0829   HGBUR NEGATIVE 08/14/2020 1701   BILIRUBINUR NEGATIVE 08/14/2020 1701   KETONESUR NEGATIVE 08/14/2020 1701   PROTEINUR 30 (A) 08/14/2020 1701   UROBILINOGEN 0.2 09/21/2018 0829   NITRITE NEGATIVE 08/14/2020 1701   LEUKOCYTESUR SMALL (A) 08/14/2020 1701      Imaging Results:    CT Head Wo Contrast  Result Date: 08/14/2020 CLINICAL DATA:  Neuro deficit, acute, stroke suspected. Decreased oral intake. History of stroke. EXAM: CT HEAD WITHOUT CONTRAST TECHNIQUE: Contiguous axial images were obtained from the base of the skull through the vertex without intravenous contrast. COMPARISON:  05/23/2020 FINDINGS: Brain: There is no evidence of an acute infarct, intracranial hemorrhage, mass, midline shift, or extra-axial fluid collection. A chronic moderately large left MCA infarct involving the frontal and temporal lobes, insula, and basal ganglia with associated ex vacuo dilatation of the frontal horn of the left lateral ventricle is unchanged. Vascular: No hyperdense vessel. Skull: No fracture or suspicious osseous lesion. Sinuses/Orbits: Visualized paranasal sinuses and mastoid air cells are clear. Visualized orbits are unremarkable. Other: None. IMPRESSION: 1. No evidence of acute intracranial abnormality. 2. Chronic left MCA infarct. Electronically Signed   By: Logan Bores M.D.   On: 08/14/2020 19:00   CT Angio Chest PE W and/or Wo Contrast  Result Date: 08/14/2020 CLINICAL DATA:  55 year old female with  concern for pulmonary embolism. EXAM: CT ANGIOGRAPHY CHEST CT ABDOMEN AND PELVIS WITH CONTRAST TECHNIQUE: Multidetector CT imaging of the chest was performed using the standard protocol during bolus administration of intravenous contrast. Multiplanar CT image reconstructions and MIPs were obtained to evaluate the vascular anatomy. Multidetector CT imaging of the abdomen and pelvis was performed using the standard protocol during bolus administration of intravenous contrast. CONTRAST:  89mL OMNIPAQUE IOHEXOL 350 MG/ML SOLN COMPARISON:  Chest CT dated 07/09/2020. FINDINGS: CTA CHEST FINDINGS Cardiovascular: Moderate cardiomegaly. There is a small pericardial effusion measuring 15 mm in thickness along the left ventricle. There is retrograde flow of contrast from the right atrium into the IVC suggestive of right heart dysfunction. The thoracic aorta is unremarkable. Evaluation of the pulmonary arteries is limited due to respiratory motion artifact. No pulmonary artery embolus identified. Mediastinum/Nodes: Mildly enlarged bilateral hilar and  mediastinal lymph nodes, reactive. The esophagus and thyroid gland are grossly unremarkable. No mediastinal fluid collection. Lungs/Pleura: Bilateral confluent nodular and ground-glass opacities consistent with multifocal pneumonia. There is no pleural effusion or pneumothorax. The central airways are patent. Musculoskeletal: No chest wall abnormality. No acute or significant osseous findings. Review of the MIP images confirms the above findings. CT ABDOMEN and PELVIS FINDINGS No intra-abdominal free air or free fluid. Hepatobiliary: Probable background of mild fatty liver. No intrahepatic biliary dilatation. Cholecystectomy. Pancreas: Unremarkable. No pancreatic ductal dilatation or surrounding inflammatory changes. Spleen: Normal in size without focal abnormality. Adrenals/Urinary Tract: The adrenal glands unremarkable. The kidneys, visualized ureters, and urinary bladder appear  unremarkable. Stomach/Bowel: There is no bowel obstruction or active inflammation. The appendix is normal. Vascular/Lymphatic: Mild aortoiliac atherosclerotic disease. The IVC is unremarkable. No portal venous gas. There is no adenopathy. Reproductive: Hysterectomy. No adnexal masses. Other: None Musculoskeletal: Degenerative changes of the spine. No acute osseous pathology. Review of the MIP images confirms the above findings. IMPRESSION: 1. No CT evidence of pulmonary embolism. 2. Multifocal pneumonia. Clinical correlation and follow-up to resolution recommended. 3. Moderate cardiomegaly with a small pericardial effusion. 4. No acute intra-abdominal or pelvic pathology. No bowel obstruction. Normal appendix. 5. Aortic Atherosclerosis (ICD10-I70.0). Electronically Signed   By: Anner Crete M.D.   On: 08/14/2020 21:50   CT ABDOMEN PELVIS W CONTRAST  Result Date: 08/14/2020 CLINICAL DATA:  55 year old female with concern for pulmonary embolism. EXAM: CT ANGIOGRAPHY CHEST CT ABDOMEN AND PELVIS WITH CONTRAST TECHNIQUE: Multidetector CT imaging of the chest was performed using the standard protocol during bolus administration of intravenous contrast. Multiplanar CT image reconstructions and MIPs were obtained to evaluate the vascular anatomy. Multidetector CT imaging of the abdomen and pelvis was performed using the standard protocol during bolus administration of intravenous contrast. CONTRAST:  54mL OMNIPAQUE IOHEXOL 350 MG/ML SOLN COMPARISON:  Chest CT dated 07/09/2020. FINDINGS: CTA CHEST FINDINGS Cardiovascular: Moderate cardiomegaly. There is a small pericardial effusion measuring 15 mm in thickness along the left ventricle. There is retrograde flow of contrast from the right atrium into the IVC suggestive of right heart dysfunction. The thoracic aorta is unremarkable. Evaluation of the pulmonary arteries is limited due to respiratory motion artifact. No pulmonary artery embolus identified.  Mediastinum/Nodes: Mildly enlarged bilateral hilar and mediastinal lymph nodes, reactive. The esophagus and thyroid gland are grossly unremarkable. No mediastinal fluid collection. Lungs/Pleura: Bilateral confluent nodular and ground-glass opacities consistent with multifocal pneumonia. There is no pleural effusion or pneumothorax. The central airways are patent. Musculoskeletal: No chest wall abnormality. No acute or significant osseous findings. Review of the MIP images confirms the above findings. CT ABDOMEN and PELVIS FINDINGS No intra-abdominal free air or free fluid. Hepatobiliary: Probable background of mild fatty liver. No intrahepatic biliary dilatation. Cholecystectomy. Pancreas: Unremarkable. No pancreatic ductal dilatation or surrounding inflammatory changes. Spleen: Normal in size without focal abnormality. Adrenals/Urinary Tract: The adrenal glands unremarkable. The kidneys, visualized ureters, and urinary bladder appear unremarkable. Stomach/Bowel: There is no bowel obstruction or active inflammation. The appendix is normal. Vascular/Lymphatic: Mild aortoiliac atherosclerotic disease. The IVC is unremarkable. No portal venous gas. There is no adenopathy. Reproductive: Hysterectomy. No adnexal masses. Other: None Musculoskeletal: Degenerative changes of the spine. No acute osseous pathology. Review of the MIP images confirms the above findings. IMPRESSION: 1. No CT evidence of pulmonary embolism. 2. Multifocal pneumonia. Clinical correlation and follow-up to resolution recommended. 3. Moderate cardiomegaly with a small pericardial effusion. 4. No acute intra-abdominal or pelvic pathology. No bowel  obstruction. Normal appendix. 5. Aortic Atherosclerosis (ICD10-I70.0). Electronically Signed   By: Anner Crete M.D.   On: 08/14/2020 21:50   DG Chest Port 1 View  Result Date: 08/14/2020 CLINICAL DATA:  55 year old female with sepsis. EXAM: PORTABLE CHEST 1 VIEW COMPARISON:  Chest radiograph dated  05/11/2020 FINDINGS: There is cardiomegaly with vascular congestion and probable mild edema. Atypical infection is not excluded clinical correlation is recommended. No focal consolidation, pleural effusion or pneumothorax. No acute osseous pathology. IMPRESSION: Cardiomegaly with vascular congestion and probable mild edema. Electronically Signed   By: Anner Crete M.D.   On: 08/14/2020 18:30      Assessment & Plan:    Active Problems:   Pneumonia   1. Sepsis secondary to pneumonia 1. Respiratory rate 24 temperature 103.1 2. Lactic acid and blood pressure stable 3. Multifocal pneumonia on CT 4. COVID-negative, vaccinated x2 no  booster 5. Started on Rocephin and Zithromax in the ED 6. Blood cultures, sputum culture 7. 1 L bolus in ED, continue 150 mL/h 8. Continue Rocephin and Zithromax 9. Admit to stepdown 2. Community-acquired pneumonia 1. Sputum cultures, Legionella and strep antigens, 2. Continue antibiotics as above 3. COVID-negative 3. A. fib with RVR 1. Heart rate ranging from 57-153 in the ED 2. Cardizem push did not control heart rate 3. Patient was started on Cardizem drip 4. Continue Cardizem drip, admit to stepdown 5. Continue apixaban 6. Continue Coreg 4. Diabetes mellitus type 2 1. Slightly hyperglycemic at 187 in the ER 2. Continue basal insulin of 10 units 3. Sliding scale 4. Carb modified heart healthy diet 5. History of CVA 1. Continue blood pressure control with home blood pressure medications 2. Continue statin 3. Initially there was some concern of worsening neurodeficit, but has improved with treatment of sepsis 4. Continue monitoring 5. CT head shows nothing acute 6.    DVT Prophylaxis-   apixaban- SCDs AM Labs Ordered, also please review Full Orders  Family Communication: No family at bedside at admission Code Status: Full  Admission status:Inpatient :The appropriate admission status for this patient is INPATIENT. Inpatient status is  judged to be reasonable and necessary in order to provide the required intensity of service to ensure the patient's safety. The patient's presenting symptoms, physical exam findings, and initial radiographic and laboratory data in the context of their chronic comorbidities is felt to place them at high risk for further clinical deterioration. Furthermore, it is not anticipated that the patient will be medically stable for discharge from the hospital within 2 midnights of admission. The following factors support the admission status of inpatient.     The patient's presenting symptoms include fever The worrisome physical exam findings include heart rate as high as 150s The initial radiographic and laboratory data are worrisome because of chest CT shows multifocal pneumonia The chronic co-morbidities include A. fib, diabetes mellitus type 2, chronic right-sided weakness from a CVA, COPD, CHF       * I certify that at the point of admission it is my clinical judgment that the patient will require inpatient hospital care spanning beyond 2 midnights from the point of admission due to high intensity of service, high risk for further deterioration and high frequency of surveillance required.*  Time spent in minutes : [place]

## 2020-08-14 NOTE — ED Notes (Signed)
Multiple attempts made to get blood cultures with no success. Pt recieving IV antibiotics through existing IV from EMS. Lab made aware.

## 2020-08-14 NOTE — ED Notes (Signed)
Pt transported to CT ?

## 2020-08-15 LAB — CBC
HCT: 32 % — ABNORMAL LOW (ref 36.0–46.0)
Hemoglobin: 9.9 g/dL — ABNORMAL LOW (ref 12.0–15.0)
MCH: 26.1 pg (ref 26.0–34.0)
MCHC: 30.9 g/dL (ref 30.0–36.0)
MCV: 84.4 fL (ref 80.0–100.0)
Platelets: 247 10*3/uL (ref 150–400)
RBC: 3.79 MIL/uL — ABNORMAL LOW (ref 3.87–5.11)
RDW: 14 % (ref 11.5–15.5)
WBC: 12.7 10*3/uL — ABNORMAL HIGH (ref 4.0–10.5)
nRBC: 0 % (ref 0.0–0.2)

## 2020-08-15 LAB — GLUCOSE, CAPILLARY
Glucose-Capillary: 173 mg/dL — ABNORMAL HIGH (ref 70–99)
Glucose-Capillary: 339 mg/dL — ABNORMAL HIGH (ref 70–99)
Glucose-Capillary: 188 mg/dL — ABNORMAL HIGH (ref 70–99)
Glucose-Capillary: 203 mg/dL — ABNORMAL HIGH (ref 70–99)

## 2020-08-15 LAB — COMPREHENSIVE METABOLIC PANEL
ALT: 10 U/L (ref 0–44)
AST: 12 U/L — ABNORMAL LOW (ref 15–41)
Albumin: 3.5 g/dL (ref 3.5–5.0)
Alkaline Phosphatase: 75 U/L (ref 38–126)
Anion gap: 9 (ref 5–15)
BUN: 20 mg/dL (ref 6–20)
CO2: 22 mmol/L (ref 22–32)
Calcium: 8.9 mg/dL (ref 8.9–10.3)
Chloride: 105 mmol/L (ref 98–111)
Creatinine, Ser: 1.62 mg/dL — ABNORMAL HIGH (ref 0.44–1.00)
GFR, Estimated: 38 mL/min — ABNORMAL LOW (ref >60)
Glucose, Bld: 179 mg/dL — ABNORMAL HIGH (ref 70–99)
Potassium: 3.4 mmol/L — ABNORMAL LOW (ref 3.5–5.1)
Sodium: 136 mmol/L (ref 135–145)
Total Bilirubin: 0.7 mg/dL (ref 0.3–1.2)
Total Protein: 6.8 g/dL (ref 6.5–8.1)

## 2020-08-15 LAB — HIV ANTIBODY (ROUTINE TESTING W REFLEX): HIV Screen 4th Generation wRfx: NONREACTIVE

## 2020-08-15 LAB — MRSA PCR SCREENING

## 2020-08-15 MED ORDER — CHLORHEXIDINE GLUCONATE CLOTH 2 % EX PADS
6.0000 | MEDICATED_PAD | Freq: Every day | CUTANEOUS | Status: DC
  Administered 2020-08-15 – 2020-08-16 (×2): 6 via TOPICAL

## 2020-08-15 MED ORDER — LEVALBUTEROL TARTRATE 45 MCG/ACT IN AERO: 1.0000 | INHALATION_SPRAY | Freq: Three times a day (TID) | RESPIRATORY_TRACT | Status: DC | PRN

## 2020-08-15 MED ORDER — POTASSIUM CHLORIDE CRYS ER 20 MEQ PO TBCR
40.0000 meq | EXTENDED_RELEASE_TABLET | Freq: Once | ORAL | Status: AC
  Administered 2020-08-15: 40 meq via ORAL
  Filled 2020-08-15: qty 2

## 2020-08-15 NOTE — Progress Notes (Signed)
PROGRESS NOTE    Dezha Meras Mccarter  [id number] DOB: July 26, 1966 DOA: 08/14/2020 PCP: Inda Coke, PA   Brief Narrative:  Robin [name]  is a 55 y.o. female, with history of type 2 diabetes mellitus, thoracic ascending aortic aneurysm, MCA stroke with right-sided weakness, GERD, essential hypertension, COPD, history of substance abuse, chronic atrial fibrillation, CHF, anemia, and more presents the ED with a chief complaint of fever.   Patient was admitted with sepsis secondary to pneumonia that appears to be community-acquired and is also noted to have atrial fibrillation with RVR requiring Cardizem drip.  Assessment & Plan:   Active Problems:   Pneumonia   1. Sepsis secondary to pneumonia-improving 1. Currently afebrile 2. Lactic acid and blood pressure stable 3. Multifocal pneumonia on CT 4. COVID-negative, vaccinated x2 no  booster 5. Started on Rocephin and Zithromax in the ED, continue current 6. Blood cultures, sputum culture, pending 7. 1 L bolus in ED, continue decrease rate of IV fluid 8. Continue Rocephin and Zithromax 9. okay to transfer to telemetry once weaned off Cardizem 2. Community-acquired pneumonia 1. Sputum cultures, Legionella and strep antigens pending 2. Continue antibiotics as above 3. COVID-negative 3. A. fib with RVR-improving 1. Heart rate ranging from 57-153 in the ED 2. Cardizem push did not control heart rate 3. Patient was started on Cardizem drip which will not be resumed 4. Plan to transfer to telemetry once weaned off Cardizem drip 5. Continue apixaban 6. Continue Coreg 4. Diabetes mellitus type 2 1. Continues to have slight hyperglycemia that is stable 2. Continue basal insulin of 10 units 3. Sliding scale 4. Carb modified heart healthy diet 5. History of CVA 1. Continue blood pressure control with home blood pressure medications 2. Continue statin 3. Initially there was some concern of worsening neurodeficit, but has improved with  treatment of sepsis 4. Continue monitoring 5. CT head shows nothing acute   DVT prophylaxis:Eliquis Code Status: Full Family Communication: Discussed with sister on phone 1/11 Disposition Plan:  Status is: Inpatient  Remains inpatient appropriate because:IV treatments appropriate due to intensity of illness or inability to take PO and Inpatient level of care appropriate due to severity of illness   Dispo: The patient is from: Home              Anticipated d/c is to: Home              Anticipated d/c date is: 1 day              Patient currently is not medically stable to d/c.  Patient currently on Cardizem drip due to atrial fibrillation with RVR requiring IV antibiotics for pneumonia.   Consultants:   None  Procedures:   See below  Antimicrobials:  Anti-infectives (From admission, onward)   Start     Dose/Rate Route Frequency Ordered Stop   08/15/20 2200  cefTRIAXone (ROCEPHIN) 2 g in sodium chloride 0.9 % 100 mL IVPB        2 g 200 mL/hr over 30 Minutes Intravenous Every 24 hours 08/14/20 2238 08/20/20 2159   08/15/20 2200  azithromycin (ZITHROMAX) 500 mg in sodium chloride 0.9 % 250 mL IVPB  Status:  Discontinued        500 mg 250 mL/hr over 60 Minutes Intravenous Every 24 hours 08/14/20 2238 08/14/20 2242   08/14/20 1845  cefTRIAXone (ROCEPHIN) 1 g in sodium chloride 0.9 % 100 mL IVPB        1 g 200 mL/hr over 30  Minutes Intravenous  Once 08/14/20 1830 08/14/20 1925   08/14/20 1845  azithromycin (ZITHROMAX) 500 mg in sodium chloride 0.9 % 250 mL IVPB        500 mg 250 mL/hr over 60 Minutes Intravenous Every 24 hours 08/14/20 1830         Subjective: Patient seen and evaluated today with no new acute complaints or concerns. No acute concerns or events noted overnight.  She denies any cough or dyspnea.  It is difficult to express herself given the patient.  Current heart rates are more stable.  Objective: Vitals:   08/15/20 0500 08/15/20 0600 08/15/20 0700  08/15/20 0745  BP: 137/80 135/78 129/78   Pulse: 83 80 81   Resp: (!) 26 (!) 28 (!) 21   Temp:    98.4 F (36.9 C)  TempSrc:    Oral  SpO2: 98% 97% 97%   Weight:      Height:        Intake/Output Summary (Last 24 hours) at 08/15/2020 0952 Last data filed at 08/14/2020 2033 Gross per 24 hour  Intake 1342.01 ml  Output -  Net 1342.01 ml   Filed Weights   08/14/20 1703  Weight: 87.7 kg    Examination:  General exam: Appears calm and comfortable  Respiratory system: Clear to auscultation. Respiratory effort normal.  Currently on room air Cardiovascular system: S1 & S2 heard, irregular Gastrointestinal system: Abdomen is soft Central nervous system: Alert and awake, has expressive aphasia Extremities: No edema Skin: No significant lesions noted Psychiatry: Flat affect.    Data Reviewed: I have personally reviewed following labs and imaging studies  CBC: Recent Labs  Lab 08/14/20 1701 08/15/20 0421  WBC 10.9* 12.7*  NEUTROABS 8.4*  --   HGB 10.7* 9.9*  HCT 35.2* 32.0*  MCV 84.0 84.4  PLT 296 034   Basic Metabolic Panel: Recent Labs  Lab 08/14/20 1701 08/15/20 0421  NA 135 136  K 3.6 3.4*  CL 104 105  CO2 22 22  GLUCOSE 187* 179*  BUN 20 20  CREATININE 1.83* 1.62*  CALCIUM 9.0 8.9  MG 1.9  --    GFR: Estimated Creatinine Clearance: 45.1 mL/min (A) (by C-G formula based on SCr of 1.62 mg/dL (H)). Liver Function Tests: Recent Labs  Lab 08/14/20 1701 08/15/20 0421  AST 15 12*  ALT 11 10  ALKPHOS 85 75  BILITOT 0.6 0.7  PROT 7.6 6.8  ALBUMIN 3.9 3.5   No results for input(s): LIPASE, AMYLASE in the last 168 hours. No results for input(s): AMMONIA in the last 168 hours. Coagulation Profile: Recent Labs  Lab 08/14/20 1701  INR 1.4*   Cardiac Enzymes: No results for input(s): CKTOTAL, CKMB, CKMBINDEX, TROPONINI in the last 168 hours. BNP (last 3 results) No results for input(s): PROBNP in the last 8760 hours. HbA1C: No results for input(s):  HGBA1C in the last 72 hours. CBG: Recent Labs  Lab 08/14/20 2311 08/15/20 0750  GLUCAP 219* 173*   Lipid Profile: No results for input(s): CHOL, HDL, LDLCALC, TRIG, CHOLHDL, LDLDIRECT in the last 72 hours. Thyroid Function Tests: No results for input(s): TSH, T4TOTAL, FREET4, T3FREE, THYROIDAB in the last 72 hours. Anemia Panel: No results for input(s): VITAMINB12, FOLATE, FERRITIN, TIBC, IRON, RETICCTPCT in the last 72 hours. Sepsis Labs: Recent Labs  Lab 08/14/20 1701 08/14/20 1922  LATICACIDVEN 1.9 1.1    Recent Results (from the past 240 hour(s))  Blood Culture (routine x 2)     Status: None (  Preliminary result)   Collection Time: 08/14/20  5:01 PM   Specimen: BLOOD LEFT FOREARM  Result Value Ref Range Status   Specimen Description BLOOD LEFT FOREARM  Final   Special Requests   Final    BOTTLES DRAWN AEROBIC AND ANAEROBIC Blood Culture adequate volume   Culture   Final    NO GROWTH < 24 HOURS Performed at [facility], [address] [place], [place] [address]    Report Status PENDING  Incomplete  Resp Panel by RT-PCR (Flu A&B, Covid) Nasopharyngeal Swab     Status: None   Collection Time: 08/14/20  5:02 PM   Specimen: Nasopharyngeal Swab; Nasopharyngeal(NP) swabs in vial transport medium  Result Value Ref Range Status   SARS Coronavirus 2 by RT PCR NEGATIVE NEGATIVE Final    Comment: (NOTE) SARS-CoV-2 target nucleic acids are NOT DETECTED.  The SARS-CoV-2 RNA is generally detectable in upper respiratory specimens during the acute phase of infection. The lowest concentration of SARS-CoV-2 viral copies this assay can detect is 138 copies/mL. A negative result does not preclude SARS-Cov-2 infection and should not be used as the sole basis for treatment or other patient management decisions. A negative result may occur with  improper specimen collection/handling, submission of specimen other than nasopharyngeal swab, presence of viral mutation(s) within the areas  targeted by this assay, and inadequate number of viral copies(<138 copies/mL). A negative result must be combined with clinical observations, patient history, and epidemiological information. The expected result is Negative.  Fact Sheet for Patients:  EntrepreneurPulse.com.au  Fact Sheet for Healthcare Providers:  IncredibleEmployment.be  This test is no t yet approved or cleared by the [place] FDA and  has been authorized for detection and/or diagnosis of SARS-CoV-2 by FDA under an Emergency Use Authorization (EUA). This EUA will remain  in effect (meaning this test can be used) for the duration of the COVID-19 declaration under Section 564(b)(1) of the Act, 21 U.S.C.section 360bbb-3(b)(1), unless the authorization is terminated  or revoked sooner.       Influenza A by PCR NEGATIVE NEGATIVE Final   Influenza B by PCR NEGATIVE NEGATIVE Final    Comment: (NOTE) The Xpert Xpress SARS-CoV-2/FLU/RSV plus assay is intended as an aid in the diagnosis of influenza from Nasopharyngeal swab specimens and should not be used as a sole basis for treatment. Nasal washings and aspirates are unacceptable for Xpert Xpress SARS-CoV-2/FLU/RSV testing.  Fact Sheet for Patients: EntrepreneurPulse.com.au  Fact Sheet for Healthcare Providers: IncredibleEmployment.be  This test is not yet approved or cleared by the [place] FDA and has been authorized for detection and/or diagnosis of SARS-CoV-2 by FDA under an Emergency Use Authorization (EUA). This EUA will remain in effect (meaning this test can be used) for the duration of the COVID-19 declaration under Section 564(b)(1) of the Act, 21 U.S.C. section 360bbb-3(b)(1), unless the authorization is terminated or revoked.  Performed at [facility], [address] [place], [place] [address]   Blood Culture (routine x 2)     Status: None (Preliminary result)    Collection Time: 08/14/20  7:22 PM   Specimen: BLOOD LEFT HAND  Result Value Ref Range Status   Specimen Description BLOOD LEFT HAND  Final   Special Requests   Final    BOTTLES DRAWN AEROBIC AND ANAEROBIC Blood Culture adequate volume   Culture   Final    NO GROWTH < 24 HOURS Performed at [facility], [address], [place], [place] [address]    Report Status  PENDING  Incomplete  MRSA PCR Screening     Status: Abnormal   Collection Time: 08/15/20 12:43 AM   Specimen: Nasal Mucosa; Nasopharyngeal  Result Value Ref Range Status   MRSA by PCR (A) NEGATIVE Final    INVALID, UNABLE TO DETERMINE THE PRESENCE OF TARGET DUE TO SPECIMEN INTEGRITY. RECOLLECTION REQUESTED.    Comment: Performed at [facility], [address] [place],  [address]         Radiology Studies: CT Head Wo Contrast  Result Date: 08/14/2020 CLINICAL DATA:  Neuro deficit, acute, stroke suspected. Decreased oral intake. History of stroke. EXAM: CT HEAD WITHOUT CONTRAST TECHNIQUE: Contiguous axial images were obtained from the base of the skull through the vertex without intravenous contrast. COMPARISON:  05/23/2020 FINDINGS: Brain: There is no evidence of an acute infarct, intracranial hemorrhage, mass, midline shift, or extra-axial fluid collection. A chronic moderately large left MCA infarct involving the frontal and temporal lobes, insula, and basal ganglia with associated ex vacuo dilatation of the frontal horn of the left lateral ventricle is unchanged. Vascular: No hyperdense vessel. Skull: No fracture or suspicious osseous lesion. Sinuses/Orbits: Visualized paranasal sinuses and mastoid air cells are clear. Visualized orbits are unremarkable. Other: None. IMPRESSION: 1. No evidence of acute intracranial abnormality. 2. Chronic left MCA infarct. Electronically Signed   By: Logan Bores M.D.   On: 08/14/2020 19:00   CT Angio Chest PE W and/or Wo Contrast  Result Date: 08/14/2020 CLINICAL DATA:  55 year old  female with concern for pulmonary embolism. EXAM: CT ANGIOGRAPHY CHEST CT ABDOMEN AND PELVIS WITH CONTRAST TECHNIQUE: Multidetector CT imaging of the chest was performed using the standard protocol during bolus administration of intravenous contrast. Multiplanar CT image reconstructions and MIPs were obtained to evaluate the vascular anatomy. Multidetector CT imaging of the abdomen and pelvis was performed using the standard protocol during bolus administration of intravenous contrast. CONTRAST:  84mL OMNIPAQUE IOHEXOL 350 MG/ML SOLN COMPARISON:  Chest CT dated 07/09/2020. FINDINGS: CTA CHEST FINDINGS Cardiovascular: Moderate cardiomegaly. There is a small pericardial effusion measuring 15 mm in thickness along the left ventricle. There is retrograde flow of contrast from the right atrium into the IVC suggestive of right heart dysfunction. The thoracic aorta is unremarkable. Evaluation of the pulmonary arteries is limited due to respiratory motion artifact. No pulmonary artery embolus identified. Mediastinum/Nodes: Mildly enlarged bilateral hilar and mediastinal lymph nodes, reactive. The esophagus and thyroid gland are grossly unremarkable. No mediastinal fluid collection. Lungs/Pleura: Bilateral confluent nodular and ground-glass opacities consistent with multifocal pneumonia. There is no pleural effusion or pneumothorax. The central airways are patent. Musculoskeletal: No chest wall abnormality. No acute or significant osseous findings. Review of the MIP images confirms the above findings. CT ABDOMEN and PELVIS FINDINGS No intra-abdominal free air or free fluid. Hepatobiliary: Probable background of mild fatty liver. No intrahepatic biliary dilatation. Cholecystectomy. Pancreas: Unremarkable. No pancreatic ductal dilatation or surrounding inflammatory changes. Spleen: Normal in size without focal abnormality. Adrenals/Urinary Tract: The adrenal glands unremarkable. The kidneys, visualized ureters, and urinary  bladder appear unremarkable. Stomach/Bowel: There is no bowel obstruction or active inflammation. The appendix is normal. Vascular/Lymphatic: Mild aortoiliac atherosclerotic disease. The IVC is unremarkable. No portal venous gas. There is no adenopathy. Reproductive: Hysterectomy. No adnexal masses. Other: None Musculoskeletal: Degenerative changes of the spine. No acute osseous pathology. Review of the MIP images confirms the above findings. IMPRESSION: 1. No CT evidence of pulmonary embolism. 2. Multifocal pneumonia. Clinical correlation and follow-up to resolution recommended. 3. Moderate cardiomegaly with a  small pericardial effusion. 4. No acute intra-abdominal or pelvic pathology. No bowel obstruction. Normal appendix. 5. Aortic Atherosclerosis (ICD10-I70.0). Electronically Signed   By: Anner Crete M.D.   On: 08/14/2020 21:50   CT ABDOMEN PELVIS W CONTRAST  Result Date: 08/14/2020 CLINICAL DATA:  55 year old female with concern for pulmonary embolism. EXAM: CT ANGIOGRAPHY CHEST CT ABDOMEN AND PELVIS WITH CONTRAST TECHNIQUE: Multidetector CT imaging of the chest was performed using the standard protocol during bolus administration of intravenous contrast. Multiplanar CT image reconstructions and MIPs were obtained to evaluate the vascular anatomy. Multidetector CT imaging of the abdomen and pelvis was performed using the standard protocol during bolus administration of intravenous contrast. CONTRAST:  14mL OMNIPAQUE IOHEXOL 350 MG/ML SOLN COMPARISON:  Chest CT dated 07/09/2020. FINDINGS: CTA CHEST FINDINGS Cardiovascular: Moderate cardiomegaly. There is a small pericardial effusion measuring 15 mm in thickness along the left ventricle. There is retrograde flow of contrast from the right atrium into the IVC suggestive of right heart dysfunction. The thoracic aorta is unremarkable. Evaluation of the pulmonary arteries is limited due to respiratory motion artifact. No pulmonary artery embolus identified.  Mediastinum/Nodes: Mildly enlarged bilateral hilar and mediastinal lymph nodes, reactive. The esophagus and thyroid gland are grossly unremarkable. No mediastinal fluid collection. Lungs/Pleura: Bilateral confluent nodular and ground-glass opacities consistent with multifocal pneumonia. There is no pleural effusion or pneumothorax. The central airways are patent. Musculoskeletal: No chest wall abnormality. No acute or significant osseous findings. Review of the MIP images confirms the above findings. CT ABDOMEN and PELVIS FINDINGS No intra-abdominal free air or free fluid. Hepatobiliary: Probable background of mild fatty liver. No intrahepatic biliary dilatation. Cholecystectomy. Pancreas: Unremarkable. No pancreatic ductal dilatation or surrounding inflammatory changes. Spleen: Normal in size without focal abnormality. Adrenals/Urinary Tract: The adrenal glands unremarkable. The kidneys, visualized ureters, and urinary bladder appear unremarkable. Stomach/Bowel: There is no bowel obstruction or active inflammation. The appendix is normal. Vascular/Lymphatic: Mild aortoiliac atherosclerotic disease. The IVC is unremarkable. No portal venous gas. There is no adenopathy. Reproductive: Hysterectomy. No adnexal masses. Other: None Musculoskeletal: Degenerative changes of the spine. No acute osseous pathology. Review of the MIP images confirms the above findings. IMPRESSION: 1. No CT evidence of pulmonary embolism. 2. Multifocal pneumonia. Clinical correlation and follow-up to resolution recommended. 3. Moderate cardiomegaly with a small pericardial effusion. 4. No acute intra-abdominal or pelvic pathology. No bowel obstruction. Normal appendix. 5. Aortic Atherosclerosis (ICD10-I70.0). Electronically Signed   By: Anner Crete M.D.   On: 08/14/2020 21:50   DG Chest Port 1 View  Result Date: 08/14/2020 CLINICAL DATA:  55 year old female with sepsis. EXAM: PORTABLE CHEST 1 VIEW COMPARISON:  Chest radiograph dated  05/11/2020 FINDINGS: There is cardiomegaly with vascular congestion and probable mild edema. Atypical infection is not excluded clinical correlation is recommended. No focal consolidation, pleural effusion or pneumothorax. No acute osseous pathology. IMPRESSION: Cardiomegaly with vascular congestion and probable mild edema. Electronically Signed   By: Anner Crete M.D.   On: 08/14/2020 18:30        Scheduled Meds: . apixaban  5 mg Oral BID  . aspirin EC  81 mg Oral Daily  . carvedilol  25 mg Oral BID WC  . Chlorhexidine Gluconate Cloth  6 each Topical Daily  . citalopram  10 mg Oral Daily  . feeding supplement  237 mL Oral BID BM  . furosemide  20 mg Oral Daily  . gabapentin  100 mg Oral TID  . hydrALAZINE  25 mg Oral TID  . insulin aspart  0-15 Units Subcutaneous TID WC  . insulin aspart  0-5 Units Subcutaneous QHS  . insulin glargine  10 Units Subcutaneous QHS  . isosorbide mononitrate  60 mg Oral Daily  . potassium chloride  40 mEq Oral Once  . rosuvastatin  20 mg Oral Daily   Continuous Infusions: . azithromycin Stopped (08/14/20 2033)  . cefTRIAXone (ROCEPHIN)  IV    . diltiazem (CARDIZEM) infusion 10 mg/hr (08/15/20 0540)  . lactated ringers 150 mL/hr at 08/15/20 0634     LOS: 1 day    Time spent: 35 minutes    Pratik D Manuella Ghazi, DO [facility]  If 7PM-7AM, please contact night-coverage [email or web address] 08/15/2020, 9:52 AM
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
## 2020-08-16 LAB — CBC
HCT: 29.6 % — ABNORMAL LOW (ref 36.0–46.0)
Hemoglobin: 9.2 g/dL — ABNORMAL LOW (ref 12.0–15.0)
MCH: 26.1 pg (ref 26.0–34.0)
MCHC: 31.1 g/dL (ref 30.0–36.0)
MCV: 84.1 fL (ref 80.0–100.0)
Platelets: 225 10*3/uL (ref 150–400)
RBC: 3.52 MIL/uL — ABNORMAL LOW (ref 3.87–5.11)
RDW: 14.1 % (ref 11.5–15.5)
WBC: 9.8 10*3/uL (ref 4.0–10.5)
nRBC: 0 % (ref 0.0–0.2)

## 2020-08-16 LAB — BASIC METABOLIC PANEL
Anion gap: 10 (ref 5–15)
BUN: 23 mg/dL — ABNORMAL HIGH (ref 6–20)
CO2: 22 mmol/L (ref 22–32)
Calcium: 8.7 mg/dL — ABNORMAL LOW (ref 8.9–10.3)
Chloride: 103 mmol/L (ref 98–111)
Creatinine, Ser: 1.83 mg/dL — ABNORMAL HIGH (ref 0.44–1.00)
GFR, Estimated: 32 mL/min — ABNORMAL LOW (ref >60)
Glucose, Bld: 168 mg/dL — ABNORMAL HIGH (ref 70–99)
Potassium: 3.7 mmol/L (ref 3.5–5.1)
Sodium: 135 mmol/L (ref 135–145)

## 2020-08-16 LAB — URINE CULTURE

## 2020-08-16 LAB — MAGNESIUM: Magnesium: 1.6 mg/dL — ABNORMAL LOW (ref 1.7–2.4)

## 2020-08-16 LAB — GLUCOSE, CAPILLARY
Glucose-Capillary: 210 mg/dL — ABNORMAL HIGH (ref 70–99)
Glucose-Capillary: 177 mg/dL — ABNORMAL HIGH (ref 70–99)

## 2020-08-16 MED ORDER — CARVEDILOL 25 MG PO TABS: 25.0000 mg | ORAL_TABLET | Freq: Two times a day (BID) | ORAL | 2 refills | Status: DC

## 2020-08-16 MED ORDER — AZITHROMYCIN 500 MG PO TABS: 500.0000 mg | ORAL_TABLET | Freq: Every day | ORAL | 0 refills | Status: AC

## 2020-08-16 MED ORDER — CEFDINIR 300 MG PO CAPS: 300.0000 mg | ORAL_CAPSULE | Freq: Two times a day (BID) | ORAL | 0 refills | Status: AC

## 2020-08-16 MED ORDER — MAGNESIUM SULFATE 2 GM/50ML IV SOLN
2.0000 g | Freq: Once | INTRAVENOUS | Status: AC
  Administered 2020-08-16: 2 g via INTRAVENOUS
  Filled 2020-08-16: qty 50

## 2020-08-16 NOTE — TOC Transition Note (Signed)
Transition of Care [facility]) - CM/SW Discharge Not  Patient Details  Name: Saraiyah Hemminger [place] MRN: [id number] Date of Birth: Apr 15, 1966  Transition of Care [facility]) CM/SW Contact:  Sherie Don, LCSW Phone Number: 08/16/2020, 12:02 PM  Clinical Narrative: Patient is ready for discharge. HH orders have been placed. CSW updated Santiago Glad with [facility]. TOC signing off.  Final next level of care: [place] Barriers to Discharge: Barriers Resolved  Patient Goals and CMS Choice Patient states their goals for this hospitalization and ongoing recovery are:: Get stronger CMS Medicare.gov Compare Post Acute Care list provided to:: Patient Represenative (must comment) Choice offered to / list presented to : Sibling  Discharge Plan and Services In-house Referral: Clinical Social Work Discharge Planning Services: NA Post Acute Care Choice: Home Health          DME Arranged: N/A DME Agency: NA HH Arranged: PT [place] Agency: [place] Date [place]: 08/16/20 Representative spoke with at [place]: [place]  Readmission Risk Interventions Readmission Risk Prevention Plan 08/15/2020 05/12/2020  Transportation Screening Complete Complete  Medication Review Press photographer) Complete Complete  HRI or Home Care Consult Complete -  SW Recovery Care/Counseling Consult Complete Complete  Palliative Care Screening Not Applicable Not [place] - Complete  Some recent data might be hidden

## 2020-08-16 NOTE — Discharge Summary (Signed)
Physician Discharge Summary  Robin [name] [id number] DOB: 09-25-1965 DOA: 08/14/2020  PCP: Inda Coke, PA  Admit date: 08/14/2020  Discharge date: 08/16/2020  Admitted From:Home  Disposition:  Home  Recommendations for Outpatient Follow-up:  1. Follow up with PCP in 1-2 weeks 2. Continue on Omnicef and Azithromycin as prescribed to complete home course of treatment 3. Follow-up with cardiology Dr. Harl Bowie in the next 2-3 weeks to ensure heart rates are stable 4. Continue other home medications plan  Home Health: PT  Equipment/Devices: None  Discharge Condition:Stable  CODE STATUS: Full  Diet recommendation: Heart Healthy/carb modified  Brief/Interim Summary: [name] a55 y.o.female,with history of type 2 diabetes mellitus, thoracic ascending aortic aneurysm, MCA stroke with right-sided weakness, GERD, essential hypertension, COPD, history of substance abuse, chronic atrial fibrillation, CHF, anemia, and more presents the ED with a chief complaint of fever.  Patient was admitted with sepsis secondary to pneumonia that appears to be community-acquired and was also noted to have atrial fibrillation with RVR requiring Cardizem drip.  She has been weaned off the drip and has had stable heart rates in the last 12 h on her usual home medications.  She will need to remain on antibiotics oral was prescribed and follow-up with PCP and cardiology in the outpatient setting to ensure heart rate stability and further improvement.  No other acute events noted throughout this admission.  She has home health continue.  Discharge Diagnoses:  Active Problems:   Pneumonia  Physical discharge diagnoses: Sepsis present on admission secondary to multifocal pneumonia.  Atrial fibrillation with RVR secondary to sepsis.  Discharge Instructions  Discharge Instructions    Diet - low sodium heart healthy   Complete by: As directed    Increase activity slowly   Complete by: As  directed      Allergies as of 08/16/2020      Reactions   Bee Venom Shortness Of Breath, Swelling   Bodily Swelling   Losartan Other (See Comments)   Nosebleeds per patient report.    Naproxen Other (See Comments)   Acid reflux   Penicillins Nausea Only   Has patient had a PCN reaction causing immediate rash, facial/tongue/throat swelling, SOB or lightheadedness with hypotension: no Has patient had a PCN reaction causing severe rash involving mucus membranes or skin necrosis: no Has patient had a PCN reaction that required hospitalization no Has patient had a PCN reaction occurring within the last 10 years: no If all of the above answers are "NO", then may proceed with Cephalosporin    Lisinopril    Sinus congestion      Medication List    TAKE these medications   acetaminophen 325 MG tablet Commonly known as: TYLENOL Take 650 mg by mouth every 8 (eight) hours as needed for mild pain.   apixaban 5 MG Tabs tablet Commonly known as: Eliquis Take 1 tablet (5 mg total) by mouth 2 (two) times daily.   aspirin EC 81 MG tablet Take 1 tablet (81 mg total) by mouth daily. Swallow whole.   azithromycin 500 MG tablet Commonly known as: Zithromax Take 1 tablet (500 mg total) by mouth daily for 3 days. Take 1 tablet daily for 3 days.   BD Pen Needle Nano U/F 32G X 4 MM Misc Generic drug: Insulin Pen Needle Inject into the skin as directed.   carvedilol 25 MG tablet Commonly known as: COREG Take 1 tablet (25 mg total) by mouth 2 (two) times daily with a meal. What changed: additional instructions  cefdinir 300 MG capsule Commonly known as: OMNICEF Take 1 capsule (300 mg total) by mouth 2 (two) times daily for 5 days.   citalopram 10 MG tablet Commonly known as: CELEXA Take 1 tablet by mouth once daily   furosemide 20 MG tablet Commonly known as: Lasix Take 1 tablet (20 mg total) by mouth daily.   gabapentin 100 MG capsule Commonly known as: NEURONTIN Take 1 capsule  (100 mg total) by mouth 3 (three) times daily.   hydrALAZINE 25 MG tablet Commonly known as: APRESOLINE Take 1 tablet (25 mg total) by mouth 3 (three) times daily.   insulin glargine 100 UNIT/ML injection Commonly known as: LANTUS Inject 10-14 Units into the skin at bedtime. If over 250 give another 2 units   isosorbide mononitrate 30 MG 24 hr tablet Commonly known as: IMDUR Take 2 tablets (60 mg total) by mouth daily.   nitroGLYCERIN 0.4 MG SL tablet Commonly known as: NITROSTAT Place 1 tablet (0.4 mg total) under the tongue every 5 (five) minutes as needed for chest pain.   pantoprazole 40 MG tablet Commonly known as: PROTONIX Take 40 mg by mouth daily.   rosuvastatin 20 MG tablet Commonly known as: CRESTOR Take 1 tablet by mouth once daily       Follow-up Information    [facility] Follow up.   Why: [facility] Contact information: [place] [address] [phone]        Inda Coke, PA Follow up in 1 week(s).   Specialty: Physician Assistant Contact information: [place] [address] [phone]        Arnoldo Lenis, MD Follow up in 2 week(s).   Specialty: Cardiology Contact information: [place] [address] [phone]              Allergies  Allergen Reactions  . Bee Venom Shortness Of Breath and Swelling    Bodily Swelling  . Losartan Other (See Comments)    Nosebleeds per patient report.   . Naproxen Other (See Comments)    Acid reflux  . Penicillins Nausea Only    Has patient had a PCN reaction causing immediate rash, facial/tongue/throat swelling, SOB or lightheadedness with hypotension: no Has patient had a PCN reaction causing severe rash involving mucus membranes or skin necrosis: no Has patient had a PCN reaction that required hospitalization no Has patient had a PCN reaction occurring within the last 10 years: no If all of the above answers are "NO",  then may proceed with Cephalosporin   . Lisinopril     Sinus congestion    Consultations:  None   Procedures/Studies: CT Head Wo Contrast  Result Date: 08/14/2020 CLINICAL DATA:  Neuro deficit, acute, stroke suspected. Decreased oral intake. History of stroke. EXAM: CT HEAD WITHOUT CONTRAST TECHNIQUE: Contiguous axial images were obtained from the base of the skull through the vertex without intravenous contrast. COMPARISON:  05/23/2020 FINDINGS: Brain: There is no evidence of an acute infarct, intracranial hemorrhage, mass, midline shift, or extra-axial fluid collection. A chronic moderately large left MCA infarct involving the frontal and temporal lobes, insula, and basal ganglia with associated ex vacuo dilatation of the frontal horn of the left lateral ventricle is unchanged. Vascular: No hyperdense vessel. Skull: No fracture or suspicious osseous lesion. Sinuses/Orbits: Visualized paranasal sinuses and mastoid air cells are clear. Visualized orbits are unremarkable. Other: None. IMPRESSION: 1. No evidence of acute intracranial abnormality. 2. Chronic left MCA infarct. Electronically Signed  By: Logan Bores M.D.   On: 08/14/2020 19:00   CT Angio Chest PE W and/or Wo Contrast  Result Date: 08/14/2020 CLINICAL DATA:  55 year old female with concern for pulmonary embolism. EXAM: CT ANGIOGRAPHY CHEST CT ABDOMEN AND PELVIS WITH CONTRAST TECHNIQUE: Multidetector CT imaging of the chest was performed using the standard protocol during bolus administration of intravenous contrast. Multiplanar CT image reconstructions and MIPs were obtained to evaluate the vascular anatomy. Multidetector CT imaging of the abdomen and pelvis was performed using the standard protocol during bolus administration of intravenous contrast. CONTRAST:  35mL OMNIPAQUE IOHEXOL 350 MG/ML SOLN COMPARISON:  Chest CT dated 07/09/2020. FINDINGS: CTA CHEST FINDINGS Cardiovascular: Moderate cardiomegaly. There is a small pericardial  effusion measuring 15 mm in thickness along the left ventricle. There is retrograde flow of contrast from the right atrium into the IVC suggestive of right heart dysfunction. The thoracic aorta is unremarkable. Evaluation of the pulmonary arteries is limited due to respiratory motion artifact. No pulmonary artery embolus identified. Mediastinum/Nodes: Mildly enlarged bilateral hilar and mediastinal lymph nodes, reactive. The esophagus and thyroid gland are grossly unremarkable. No mediastinal fluid collection. Lungs/Pleura: Bilateral confluent nodular and ground-glass opacities consistent with multifocal pneumonia. There is no pleural effusion or pneumothorax. The central airways are patent. Musculoskeletal: No chest wall abnormality. No acute or significant osseous findings. Review of the MIP images confirms the above findings. CT ABDOMEN and PELVIS FINDINGS No intra-abdominal free air or free fluid. Hepatobiliary: Probable background of mild fatty liver. No intrahepatic biliary dilatation. Cholecystectomy. Pancreas: Unremarkable. No pancreatic ductal dilatation or surrounding inflammatory changes. Spleen: Normal in size without focal abnormality. Adrenals/Urinary Tract: The adrenal glands unremarkable. The kidneys, visualized ureters, and urinary bladder appear unremarkable. Stomach/Bowel: There is no bowel obstruction or active inflammation. The appendix is normal. Vascular/Lymphatic: Mild aortoiliac atherosclerotic disease. The IVC is unremarkable. No portal venous gas. There is no adenopathy. Reproductive: Hysterectomy. No adnexal masses. Other: None Musculoskeletal: Degenerative changes of the spine. No acute osseous pathology. Review of the MIP images confirms the above findings. IMPRESSION: 1. No CT evidence of pulmonary embolism. 2. Multifocal pneumonia. Clinical correlation and follow-up to resolution recommended. 3. Moderate cardiomegaly with a small pericardial effusion. 4. No acute intra-abdominal or  pelvic pathology. No bowel obstruction. Normal appendix. 5. Aortic Atherosclerosis (ICD10-I70.0). Electronically Signed   By: Anner Crete M.D.   On: 08/14/2020 21:50   CT ABDOMEN PELVIS W CONTRAST  Result Date: 08/14/2020 CLINICAL DATA:  55 year old female with concern for pulmonary embolism. EXAM: CT ANGIOGRAPHY CHEST CT ABDOMEN AND PELVIS WITH CONTRAST TECHNIQUE: Multidetector CT imaging of the chest was performed using the standard protocol during bolus administration of intravenous contrast. Multiplanar CT image reconstructions and MIPs were obtained to evaluate the vascular anatomy. Multidetector CT imaging of the abdomen and pelvis was performed using the standard protocol during bolus administration of intravenous contrast. CONTRAST:  20mL OMNIPAQUE IOHEXOL 350 MG/ML SOLN COMPARISON:  Chest CT dated 07/09/2020. FINDINGS: CTA CHEST FINDINGS Cardiovascular: Moderate cardiomegaly. There is a small pericardial effusion measuring 15 mm in thickness along the left ventricle. There is retrograde flow of contrast from the right atrium into the IVC suggestive of right heart dysfunction. The thoracic aorta is unremarkable. Evaluation of the pulmonary arteries is limited due to respiratory motion artifact. No pulmonary artery embolus identified. Mediastinum/Nodes: Mildly enlarged bilateral hilar and mediastinal lymph nodes, reactive. The esophagus and thyroid gland are grossly unremarkable. No mediastinal fluid collection. Lungs/Pleura: Bilateral confluent nodular and ground-glass opacities consistent with multifocal pneumonia. There  is no pleural effusion or pneumothorax. The central airways are patent. Musculoskeletal: No chest wall abnormality. No acute or significant osseous findings. Review of the MIP images confirms the above findings. CT ABDOMEN and PELVIS FINDINGS No intra-abdominal free air or free fluid. Hepatobiliary: Probable background of mild fatty liver. No intrahepatic biliary dilatation.  Cholecystectomy. Pancreas: Unremarkable. No pancreatic ductal dilatation or surrounding inflammatory changes. Spleen: Normal in size without focal abnormality. Adrenals/Urinary Tract: The adrenal glands unremarkable. The kidneys, visualized ureters, and urinary bladder appear unremarkable. Stomach/Bowel: There is no bowel obstruction or active inflammation. The appendix is normal. Vascular/Lymphatic: Mild aortoiliac atherosclerotic disease. The IVC is unremarkable. No portal venous gas. There is no adenopathy. Reproductive: Hysterectomy. No adnexal masses. Other: None Musculoskeletal: Degenerative changes of the spine. No acute osseous pathology. Review of the MIP images confirms the above findings. IMPRESSION: 1. No CT evidence of pulmonary embolism. 2. Multifocal pneumonia. Clinical correlation and follow-up to resolution recommended. 3. Moderate cardiomegaly with a small pericardial effusion. 4. No acute intra-abdominal or pelvic pathology. No bowel obstruction. Normal appendix. 5. Aortic Atherosclerosis (ICD10-I70.0). Electronically Signed   By: Anner Crete M.D.   On: 08/14/2020 21:50   DG Chest Port 1 View  Result Date: 08/14/2020 CLINICAL DATA:  55 year old female with sepsis. EXAM: PORTABLE CHEST 1 VIEW COMPARISON:  Chest radiograph dated 05/11/2020 FINDINGS: There is cardiomegaly with vascular congestion and probable mild edema. Atypical infection is not excluded clinical correlation is recommended. No focal consolidation, pleural effusion or pneumothorax. No acute osseous pathology. IMPRESSION: Cardiomegaly with vascular congestion and probable mild edema. Electronically Signed   By: Anner Crete M.D.   On: 08/14/2020 18:30      Discharge Exam: Vitals:   08/16/20 0900 08/16/20 1000  BP: 127/76 135/89  Pulse: (!) 101 94  Resp: 19 20  Temp:    SpO2: 100% 100%   Vitals:   08/16/20 0758 08/16/20 0800 08/16/20 0900 08/16/20 1000  BP:  131/86 127/76 135/89  Pulse:  95 (!) 101 94   Resp:  16 19 20   Temp: 98.8 F (37.1 C)     TempSrc: Oral     SpO2:  100% 100% 100%  Weight:      Height:        General: Pt is alert, awake, not in acute distress Cardiovascular: Irregular rate, S1/S2 +, no rubs, no gallops Respiratory: CTA bilaterally, no wheezing, no rhonchi Abdominal: Soft, NT, ND, bowel sounds + Extremities: no edema, no cyanosis    The results of significant diagnostics from this hospitalization (including imaging, microbiology, ancillary and laboratory) are listed below for reference.     Microbiology: Recent Results (from the past 240 hour(s))  Blood Culture (routine x 2)     Status: None (Preliminary result)   Collection Time: 08/14/20  5:01 PM   Specimen: BLOOD LEFT FOREARM  Result Value Ref Range Status   Specimen Description BLOOD LEFT FOREARM  Final   Special Requests   Final    BOTTLES DRAWN AEROBIC AND ANAEROBIC Blood Culture adequate volume   Culture   Final    NO GROWTH 2 DAYS Performed at [facility], [address], [place], [place] [address]    Report Status PENDING  Incomplete  Urine culture     Status: Abnormal   Collection Time: 08/14/20  5:01 PM   Specimen: In/Out Cath Urine  Result Value Ref Range Status   Specimen Description   Final    IN/OUT CATH URINE Performed at [facility], [address] [place], [place] [address]    Special Requests   Final    NONE Performed at [facility], [address] [place], [place] [address]    Culture MULTIPLE SPECIES PRESENT, SUGGEST RECOLLECTION (A)  Final   Report Status 08/16/2020 FINAL  Final  Resp Panel by RT-PCR (Flu A&B, Covid) Nasopharyngeal Swab     Status: None   Collection Time: 08/14/20  5:02 PM   Specimen: Nasopharyngeal Swab; Nasopharyngeal(NP) swabs in vial transport medium  Result Value Ref Range Status   SARS Coronavirus 2 by RT PCR NEGATIVE NEGATIVE Final    Comment: (NOTE) SARS-CoV-2 target nucleic acids are NOT DETECTED.  The SARS-CoV-2 RNA is generally  detectable in upper respiratory specimens during the acute phase of infection. The lowest concentration of SARS-CoV-2 viral copies this assay can detect is 138 copies/mL. A negative result does not preclude SARS-Cov-2 infection and should not be used as the sole basis for treatment or other patient management decisions. A negative result may occur with  improper specimen collection/handling, submission of specimen other than nasopharyngeal swab, presence of viral mutation(s) within the areas targeted by this assay, and inadequate number of viral copies(<138 copies/mL). A negative result must be combined with clinical observations, patient history, and epidemiological information. The expected result is Negative.  Fact Sheet for Patients:  EntrepreneurPulse.com.au  Fact Sheet for Healthcare Providers:  IncredibleEmployment.be  This test is no t yet approved or cleared by the [place] FDA and  has been authorized for detection and/or diagnosis of SARS-CoV-2 by FDA under an Emergency Use Authorization (EUA). This EUA will remain  in effect (meaning this test can be used) for the duration of the COVID-19 declaration under Section 564(b)(1) of the Act, 21 U.S.C.section 360bbb-3(b)(1), unless the authorization is terminated  or revoked sooner.       Influenza A by PCR NEGATIVE NEGATIVE Final   Influenza B by PCR NEGATIVE NEGATIVE Final    Comment: (NOTE) The Xpert Xpress SARS-CoV-2/FLU/RSV plus assay is intended as an aid in the diagnosis of influenza from Nasopharyngeal swab specimens and should not be used as a sole basis for treatment. Nasal washings and aspirates are unacceptable for Xpert Xpress SARS-CoV-2/FLU/RSV testing.  Fact Sheet for Patients: EntrepreneurPulse.com.au  Fact Sheet for Healthcare Providers: IncredibleEmployment.be  This test is not yet approved or cleared by the [place] FDA  and has been authorized for detection and/or diagnosis of SARS-CoV-2 by FDA under an Emergency Use Authorization (EUA). This EUA will remain in effect (meaning this test can be used) for the duration of the COVID-19 declaration under Section 564(b)(1) of the Act, 21 U.S.C. section 360bbb-3(b)(1), unless the authorization is terminated or revoked.  Performed at [facility], [address], [place], [place] [address]   Blood Culture (routine x 2)     Status: None (Preliminary result)   Collection Time: 08/14/20  7:22 PM   Specimen: BLOOD LEFT HAND  Result Value Ref Range Status   Specimen Description BLOOD LEFT HAND  Final   Special Requests   Final    BOTTLES DRAWN AEROBIC AND ANAEROBIC Blood Culture adequate volume   Culture   Final    NO GROWTH 2 DAYS Performed at [facility], [address] [place], [place] [address]    Report Status PENDING  Incomplete  MRSA PCR Screening     Status: Abnormal   Collection Time: 08/15/20 12:43 AM   Specimen: Nasal Mucosa; Nasopharyngeal  Result Value Ref Range Status   MRSA by  PCR (A) NEGATIVE Final    INVALID, UNABLE TO DETERMINE THE PRESENCE OF TARGET DUE TO SPECIMEN INTEGRITY. RECOLLECTION REQUESTED.    Comment: Performed at [facility], [address], [place], [place] [address]     Labs: BNP (last 3 results) Recent Labs    10/31/19 0635 12/26/19 2255 07/09/20 0631  BNP 237.0* 367.0* [id number]*   Basic Metabolic Panel: Recent Labs  Lab 08/14/20 1701 08/15/20 0421 08/16/20 0509  NA 135 136 135  K 3.6 3.4* 3.7  CL 104 105 103  CO2 22 22 22   GLUCOSE 187* 179* 168*  BUN 20 20 23*  CREATININE 1.83* 1.62* 1.83*  CALCIUM 9.0 8.9 8.7*  MG 1.9  --  1.6*   Liver Function Tests: Recent Labs  Lab 08/14/20 1701 08/15/20 0421  AST 15 12*  ALT 11 10  ALKPHOS 85 75  BILITOT 0.6 0.7  PROT 7.6 6.8  ALBUMIN 3.9 3.5   No results for input(s): LIPASE, AMYLASE in the last 168 hours. No results for input(s): AMMONIA in the last 168  hours. CBC: Recent Labs  Lab 08/14/20 1701 08/15/20 0421 08/16/20 0509  WBC 10.9* 12.7* 9.8  NEUTROABS 8.4*  --   --   HGB 10.7* 9.9* 9.2*  HCT 35.2* 32.0* 29.6*  MCV 84.0 84.4 84.1  PLT 296 247 225   Cardiac Enzymes: No results for input(s): CKTOTAL, CKMB, CKMBINDEX, TROPONINI in the last 168 hours. BNP: Invalid input(s): POCBNP CBG: Recent Labs  Lab 08/15/20 0750 08/15/20 1152 08/15/20 1620 08/15/20 2144 08/16/20 0800  GLUCAP 173* 339* 188* 203* 210*   D-Dimer No results for input(s): DDIMER in the last 72 hours. Hgb A1c No results for input(s): HGBA1C in the last 72 hours. Lipid Profile No results for input(s): CHOL, HDL, LDLCALC, TRIG, CHOLHDL, LDLDIRECT in the last 72 hours. Thyroid function studies No results for input(s): TSH, T4TOTAL, T3FREE, THYROIDAB in the last 72 hours.  Invalid input(s): FREET3 Anemia work up No results for input(s): VITAMINB12, FOLATE, FERRITIN, TIBC, IRON, RETICCTPCT in the last 72 hours. Urinalysis    Component Value Date/Time   COLORURINE YELLOW 08/14/2020 1701   APPEARANCEUR CLEAR 08/14/2020 1701   LABSPEC 1.013 08/14/2020 1701   PHURINE 6.0 08/14/2020 1701   GLUCOSEU 50 (A) 08/14/2020 1701   GLUCOSEU >=1000 (A) 09/21/2018 0829   HGBUR NEGATIVE 08/14/2020 1701   BILIRUBINUR NEGATIVE 08/14/2020 1701   KETONESUR NEGATIVE 08/14/2020 1701   PROTEINUR 30 (A) 08/14/2020 1701   UROBILINOGEN 0.2 09/21/2018 0829   NITRITE NEGATIVE 08/14/2020 1701   LEUKOCYTESUR SMALL (A) 08/14/2020 1701   Sepsis Labs Invalid input(s): PROCALCITONIN,  WBC,  LACTICIDVEN Microbiology Recent Results (from the past 240 hour(s))  Blood Culture (routine x 2)     Status: None (Preliminary result)   Collection Time: 08/14/20  5:01 PM   Specimen: BLOOD LEFT FOREARM  Result Value Ref Range Status   Specimen Description BLOOD LEFT FOREARM  Final   Special Requests   Final    BOTTLES DRAWN AEROBIC AND ANAEROBIC Blood Culture adequate volume   Culture    Final    NO GROWTH 2 DAYS Performed at [facility], [address] [place], [place] [address]    Report Status PENDING  Incomplete  Urine culture     Status: Abnormal   Collection Time: 08/14/20  5:01 PM   Specimen: In/Out Cath Urine  Result Value Ref Range Status   Specimen Description   Final    IN/OUT CATH URINE Performed at [facility], [address], [place], [place] [address]    Special Requests   Final    NONE Performed at [facility], [address] [place], [place] [address]    Culture MULTIPLE SPECIES PRESENT, SUGGEST RECOLLECTION (A)  Final   Report Status 08/16/2020 FINAL  Final  Resp Panel by RT-PCR (Flu A&B, Covid) Nasopharyngeal Swab     Status: None   Collection Time: 08/14/20  5:02 PM   Specimen: Nasopharyngeal Swab; Nasopharyngeal(NP) swabs in vial transport medium  Result Value Ref Range Status   SARS Coronavirus 2 by RT PCR NEGATIVE NEGATIVE Final    Comment: (NOTE) SARS-CoV-2 target nucleic acids are NOT DETECTED.  The SARS-CoV-2 RNA is generally detectable in upper respiratory specimens during the acute phase of infection. The lowest concentration of SARS-CoV-2 viral copies this assay can detect is 138 copies/mL. A negative result does not preclude SARS-Cov-2 infection and should not be used as the sole basis for treatment or other patient management decisions. A negative result may occur with  improper specimen collection/handling, submission of specimen other than nasopharyngeal swab, presence of viral mutation(s) within the areas targeted by this assay, and inadequate number of viral copies(<138 copies/mL). A negative result must be combined with clinical observations, patient history, and epidemiological information. The expected result is Negative.  Fact Sheet for Patients:  EntrepreneurPulse.com.au  Fact Sheet for Healthcare Providers:  IncredibleEmployment.be  This test is no t yet approved or cleared by  the [place] FDA and  has been authorized for detection and/or diagnosis of SARS-CoV-2 by FDA under an Emergency Use Authorization (EUA). This EUA will remain  in effect (meaning this test can be used) for the duration of the COVID-19 declaration under Section 564(b)(1) of the Act, 21 U.S.C.section 360bbb-3(b)(1), unless the authorization is terminated  or revoked sooner.       Influenza A by PCR NEGATIVE NEGATIVE Final   Influenza B by PCR NEGATIVE NEGATIVE Final    Comment: (NOTE) The Xpert Xpress SARS-CoV-2/FLU/RSV plus assay is intended as an aid in the diagnosis of influenza from Nasopharyngeal swab specimens and should not be used as a sole basis for treatment. Nasal washings and aspirates are unacceptable for Xpert Xpress SARS-CoV-2/FLU/RSV testing.  Fact Sheet for Patients: EntrepreneurPulse.com.au  Fact Sheet for Healthcare Providers: IncredibleEmployment.be  This test is not yet approved or cleared by the [place] FDA and has been authorized for detection and/or diagnosis of SARS-CoV-2 by FDA under an Emergency Use Authorization (EUA). This EUA will remain in effect (meaning this test can be used) for the duration of the COVID-19 declaration under Section 564(b)(1) of the Act, 21 U.S.C. section 360bbb-3(b)(1), unless the authorization is terminated or revoked.  Performed at [facility], [address], [place], [place] [address]   Blood Culture (routine x 2)     Status: None (Preliminary result)   Collection Time: 08/14/20  7:22 PM   Specimen: BLOOD LEFT HAND  Result Value Ref Range Status   Specimen Description BLOOD LEFT HAND  Final   Special Requests   Final    BOTTLES DRAWN AEROBIC AND ANAEROBIC Blood Culture adequate volume   Culture   Final    NO GROWTH 2 DAYS Performed at [facility], [address] [place], [place] [address]    Report Status PENDING  Incomplete  MRSA PCR Screening     Status: Abnormal    Collection Time: 08/15/20 12:43 AM   Specimen: Nasal Mucosa; Nasopharyngeal  Result Value Ref Range Status  MRSA by PCR (A) NEGATIVE Final    INVALID, UNABLE TO DETERMINE THE PRESENCE OF TARGET DUE TO SPECIMEN INTEGRITY. RECOLLECTION REQUESTED.    Comment: Performed at [facility], [address] [place], [place] [address]     Time coordinating discharge: 35 minutes  SIGNED:   Rodena Goldmann, DO [facility] 08/16/2020, 10:22 AM  If 7PM-7AM, please contact night-coverage [email or web address]

## 2020-08-16 NOTE — Progress Notes (Signed)
Discharge paperwork given to patient and Daughter, Patient transported via wheelchair to personal vehicle.

## 2020-08-18 ENCOUNTER — Telehealth: Payer: Self-pay

## 2020-08-18 NOTE — Telephone Encounter (Cosign Needed)
Transition Care Management Unsuccessful Follow-up Telephone Call  Date of discharge and from where:  08/16/20  Attempts:  2nd Attempt  Reason for unsuccessful TCM follow-up call:  Left voice message

## 2020-08-19 LAB — CULTURE, BLOOD (ROUTINE X 2)
Culture: NO GROWTH
Culture: NO GROWTH
Special Requests: ADEQUATE
Special Requests: ADEQUATE

## 2020-08-24 ENCOUNTER — Other Ambulatory Visit: Payer: Self-pay | Admitting: Physician Assistant

## 2020-08-24 DIAGNOSIS — E876 Hypokalemia: Secondary | ICD-10-CM

## 2020-08-24 DIAGNOSIS — R4701 Aphasia: Secondary | ICD-10-CM

## 2020-09-07 ENCOUNTER — Emergency Department (HOSPITAL_COMMUNITY)
Admission: EM | Admit: 2020-09-07 | Discharge: 2020-09-07 | Disposition: A | Discharge status: Home / Self Care | Payer: 59 | Attending: Emergency Medicine | Admitting: Emergency Medicine

## 2020-09-07 ENCOUNTER — Encounter (HOSPITAL_COMMUNITY): Payer: Self-pay | Admitting: Emergency Medicine

## 2020-09-07 ENCOUNTER — Emergency Department (HOSPITAL_COMMUNITY): Payer: 59

## 2020-09-07 ENCOUNTER — Other Ambulatory Visit: Payer: Self-pay

## 2020-09-07 DIAGNOSIS — Z87891 Personal history of nicotine dependence: Secondary | ICD-10-CM | POA: Diagnosis not present

## 2020-09-07 DIAGNOSIS — I130 Hypertensive heart and chronic kidney disease with heart failure and stage 1 through stage 4 chronic kidney disease, or unspecified chronic kidney disease: Secondary | ICD-10-CM | POA: Insufficient documentation

## 2020-09-07 DIAGNOSIS — N3000 Acute cystitis without hematuria: Secondary | ICD-10-CM | POA: Diagnosis not present

## 2020-09-07 DIAGNOSIS — Z7901 Long term (current) use of anticoagulants: Secondary | ICD-10-CM | POA: Diagnosis not present

## 2020-09-07 DIAGNOSIS — E1122 Type 2 diabetes mellitus with diabetic chronic kidney disease: Secondary | ICD-10-CM | POA: Diagnosis not present

## 2020-09-07 DIAGNOSIS — U071 COVID-19: Secondary | ICD-10-CM | POA: Diagnosis not present

## 2020-09-07 DIAGNOSIS — Z794 Long term (current) use of insulin: Secondary | ICD-10-CM | POA: Diagnosis not present

## 2020-09-07 DIAGNOSIS — R4182 Altered mental status, unspecified: Secondary | ICD-10-CM

## 2020-09-07 DIAGNOSIS — Z79899 Other long term (current) drug therapy: Secondary | ICD-10-CM | POA: Diagnosis not present

## 2020-09-07 DIAGNOSIS — E1143 Type 2 diabetes mellitus with diabetic autonomic (poly)neuropathy: Secondary | ICD-10-CM | POA: Insufficient documentation

## 2020-09-07 DIAGNOSIS — R5383 Other fatigue: Secondary | ICD-10-CM

## 2020-09-07 DIAGNOSIS — N1831 Chronic kidney disease, stage 3a: Secondary | ICD-10-CM | POA: Insufficient documentation

## 2020-09-07 DIAGNOSIS — I4891 Unspecified atrial fibrillation: Secondary | ICD-10-CM | POA: Insufficient documentation

## 2020-09-07 DIAGNOSIS — Z7982 Long term (current) use of aspirin: Secondary | ICD-10-CM | POA: Diagnosis not present

## 2020-09-07 DIAGNOSIS — I5043 Acute on chronic combined systolic (congestive) and diastolic (congestive) heart failure: Secondary | ICD-10-CM | POA: Diagnosis not present

## 2020-09-07 DIAGNOSIS — R5382 Chronic fatigue, unspecified: Secondary | ICD-10-CM | POA: Diagnosis present

## 2020-09-07 DIAGNOSIS — J449 Chronic obstructive pulmonary disease, unspecified: Secondary | ICD-10-CM | POA: Diagnosis not present

## 2020-09-07 LAB — URINALYSIS, ROUTINE W REFLEX MICROSCOPIC
Bilirubin Urine: NEGATIVE
Glucose, UA: NEGATIVE mg/dL
Hgb urine dipstick: NEGATIVE
Ketones, ur: NEGATIVE mg/dL
Nitrite: NEGATIVE
Protein, ur: 30 mg/dL — AB
Specific Gravity, Urine: 1.015 (ref 1.005–1.030)
pH: 6 (ref 5.0–8.0)

## 2020-09-07 LAB — CBC WITH DIFFERENTIAL/PLATELET
Abs Immature Granulocytes: 0.01 10*3/uL (ref 0.00–0.07)
Basophils Absolute: 0 10*3/uL (ref 0.0–0.1)
Basophils Relative: 1 %
Eosinophils Absolute: 0.1 10*3/uL (ref 0.0–0.5)
Eosinophils Relative: 2 %
HCT: 34.5 % — ABNORMAL LOW (ref 36.0–46.0)
Hemoglobin: 10.8 g/dL — ABNORMAL LOW (ref 12.0–15.0)
Immature Granulocytes: 0 %
Lymphocytes Relative: 24 %
Lymphs Abs: 1.5 10*3/uL (ref 0.7–4.0)
MCH: 25.4 pg — ABNORMAL LOW (ref 26.0–34.0)
MCHC: 31.3 g/dL (ref 30.0–36.0)
MCV: 81 fL (ref 80.0–100.0)
Monocytes Absolute: 0.6 10*3/uL (ref 0.1–1.0)
Monocytes Relative: 10 %
Neutro Abs: 4.2 10*3/uL (ref 1.7–7.7)
Neutrophils Relative %: 63 %
Platelets: 230 10*3/uL (ref 150–400)
RBC: 4.26 MIL/uL (ref 3.87–5.11)
RDW: 13.6 % (ref 11.5–15.5)
WBC: 6.5 10*3/uL (ref 4.0–10.5)
nRBC: 0 % (ref 0.0–0.2)

## 2020-09-07 LAB — COMPREHENSIVE METABOLIC PANEL
ALT: 11 U/L (ref 0–44)
AST: 14 U/L — ABNORMAL LOW (ref 15–41)
Albumin: 3.8 g/dL (ref 3.5–5.0)
Alkaline Phosphatase: 79 U/L (ref 38–126)
Anion gap: 9 (ref 5–15)
BUN: 20 mg/dL (ref 6–20)
CO2: 25 mmol/L (ref 22–32)
Calcium: 9.4 mg/dL (ref 8.9–10.3)
Chloride: 103 mmol/L (ref 98–111)
Creatinine, Ser: 1.81 mg/dL — ABNORMAL HIGH (ref 0.44–1.00)
GFR, Estimated: 33 mL/min — ABNORMAL LOW (ref >60)
Glucose, Bld: 139 mg/dL — ABNORMAL HIGH (ref 70–99)
Potassium: 4.1 mmol/L (ref 3.5–5.1)
Sodium: 137 mmol/L (ref 135–145)
Total Bilirubin: 0.7 mg/dL (ref 0.3–1.2)
Total Protein: 7.6 g/dL (ref 6.5–8.1)

## 2020-09-07 LAB — CBG MONITORING, ED: Glucose-Capillary: 125 mg/dL — ABNORMAL HIGH (ref 70–99)

## 2020-09-07 LAB — BRAIN NATRIURETIC PEPTIDE: B Natriuretic Peptide: 630 pg/mL — ABNORMAL HIGH (ref 0.0–100.0)

## 2020-09-07 LAB — SARS CORONAVIRUS 2 BY RT PCR (HOSPITAL ORDER, PERFORMED IN ~~LOC~~ HOSPITAL LAB): SARS Coronavirus 2: POSITIVE — AB

## 2020-09-07 LAB — LACTIC ACID, PLASMA: Lactic Acid, Venous: 1.1 mmol/L (ref 0.5–1.9)

## 2020-09-07 IMAGING — DX DG CHEST 1V
1 series · 1 of 1 positions shown · non-contrast
Comparison: CT chest [DATE].  Chest x-ray [DATE].

CLINICAL DATA: Loss of consciousness.  Chest pain.

EXAM:
CHEST  1 VIEW

[chest ap]
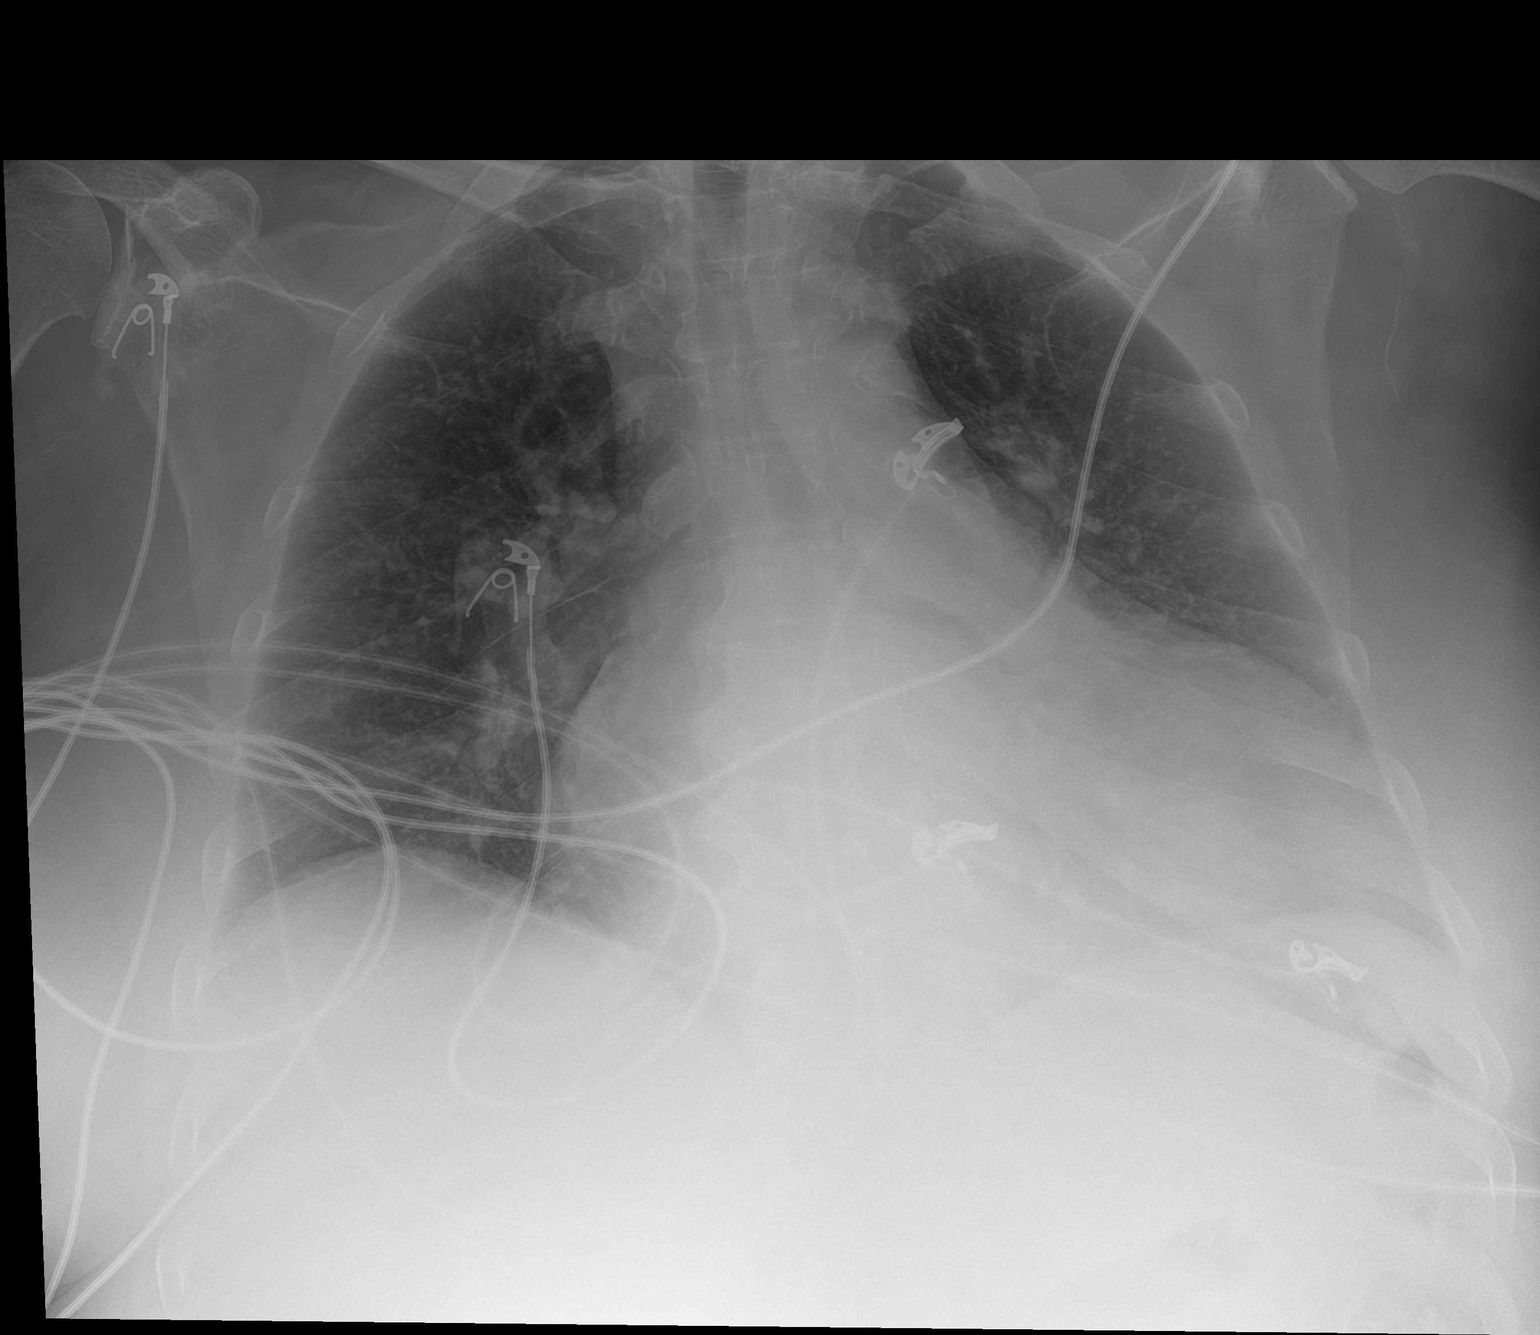

[1 of 1 positions shown; findings below may reference images not displayed]

FINDINGS: Cardiomegaly. Mild pulmonary venous congestion. Low lung volumes.
Mild basilar atelectasis. No focal infiltrate noted on today's exam.
No pleural effusion or pneumothorax. Degenerative change in
scoliosis thoracic spine.
IMPRESSION: 1. Cardiomegaly. Mild pulmonary venous congestion.
2. Low lung volumes. Mild bibasilar atelectasis. No focal infiltrate
noted on today's exam.

## 2020-09-07 IMAGING — CT CT HEAD W/O CM
3 series · 16 of 47 positions shown, 19 images · non-contrast
Comparison: None.

CLINICAL DATA: Mental status change

EXAM:
CT HEAD WITHOUT CONTRAST
TECHNIQUE: Contiguous axial images were obtained from the base of the skull
through the vertex without intravenous contrast.

[Series 3: head w o · axial · 0.41mm/px · z∈[-37,+88]mm · 10 of 30 slices shown, 13 images]
[im 3/30  brain]
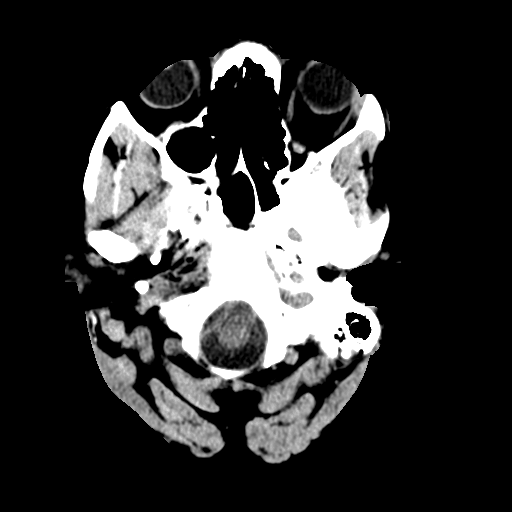
[im 3/30  bone]
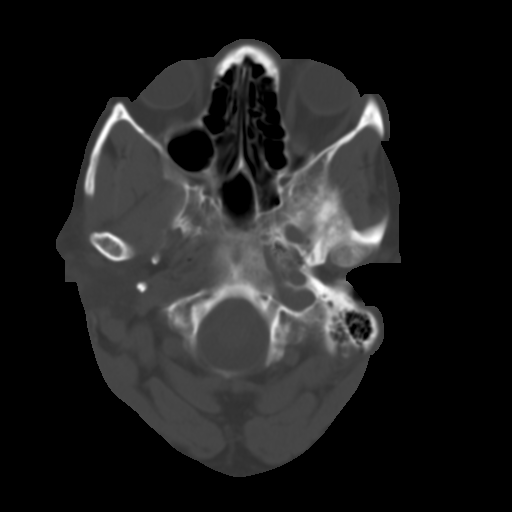
[im 6/30  brain]
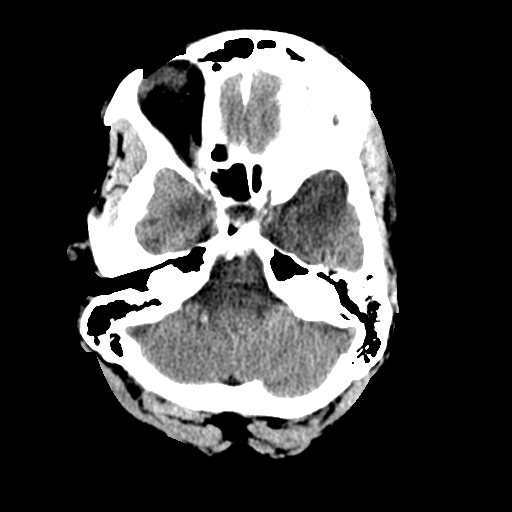
[im 9/30  brain]
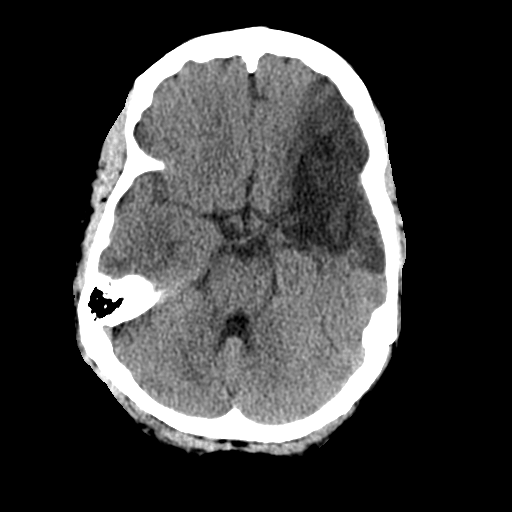
[im 11/30  brain]
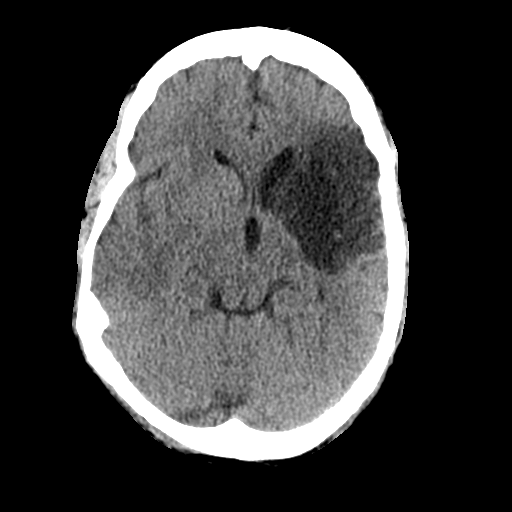
[im 14/30  brain]
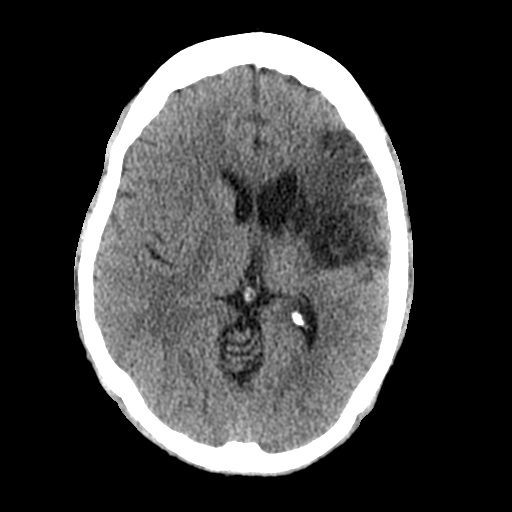
[im 14/30  bone]
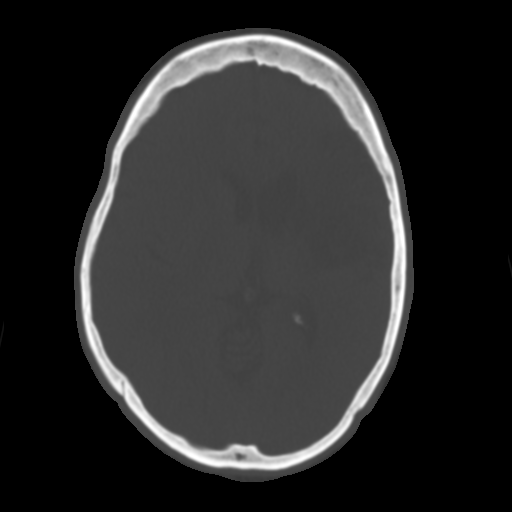
[im 17/30  brain]
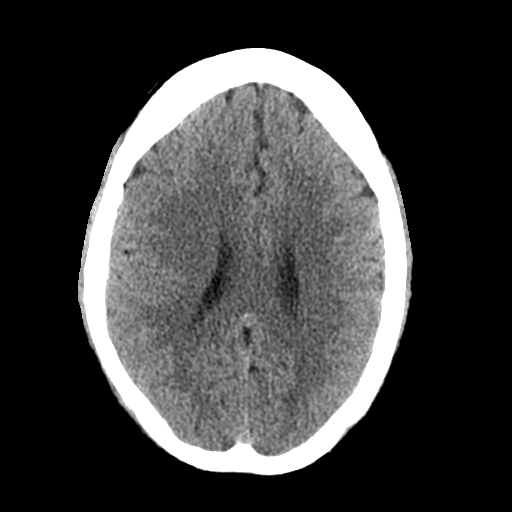
[im 20/30  brain]
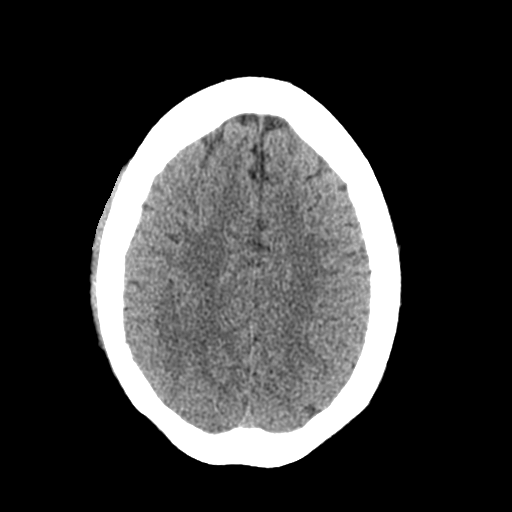
[im 23/30  brain]
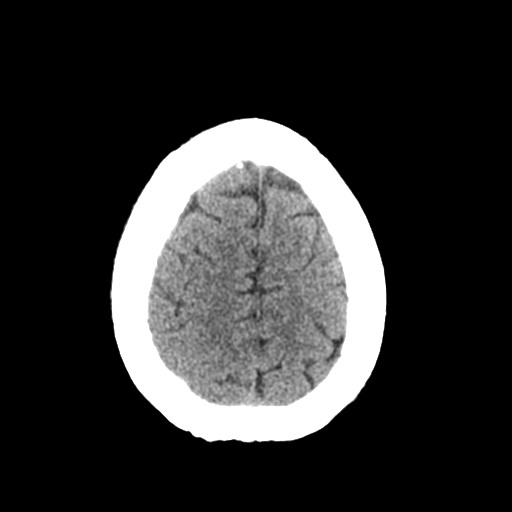
[im 25/30  brain]
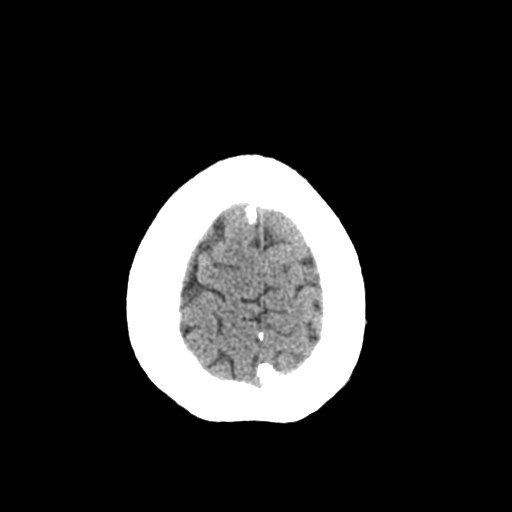
[im 25/30  bone]
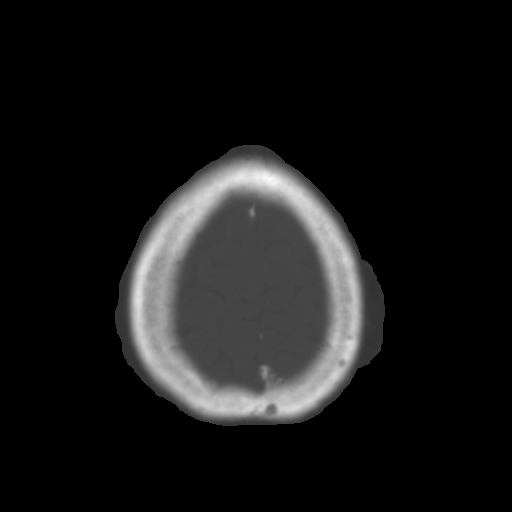
[im 28/30  brain]
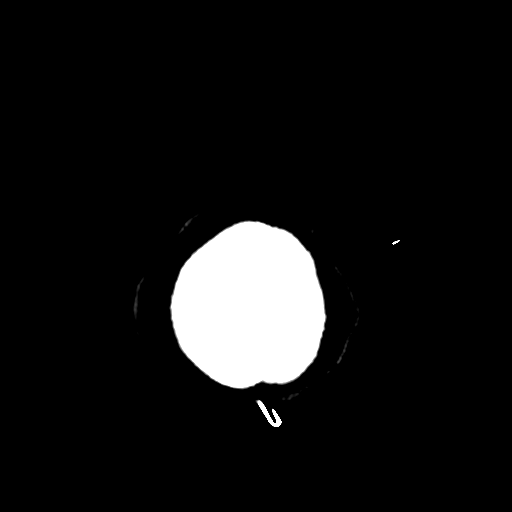

[Series 4: coronal soft · coronal · 0.36mm/px · 3 of 72 slices shown]
[im 24/72  brain]
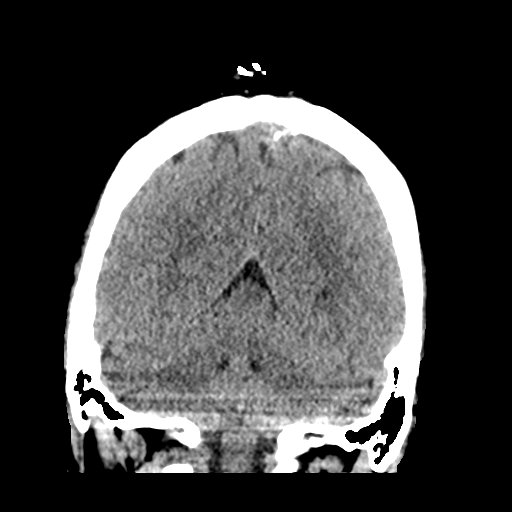
[im 32/72  brain]
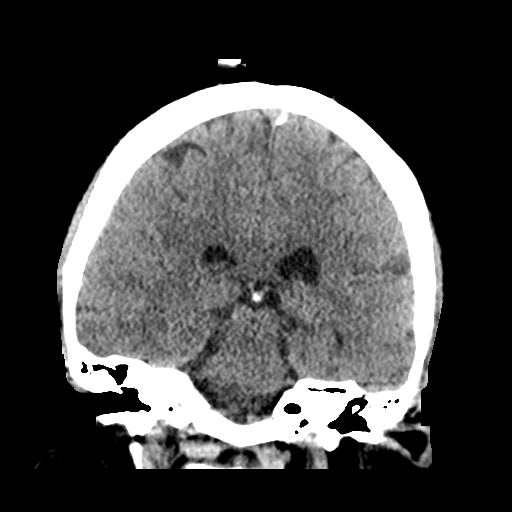
[im 40/72  brain]
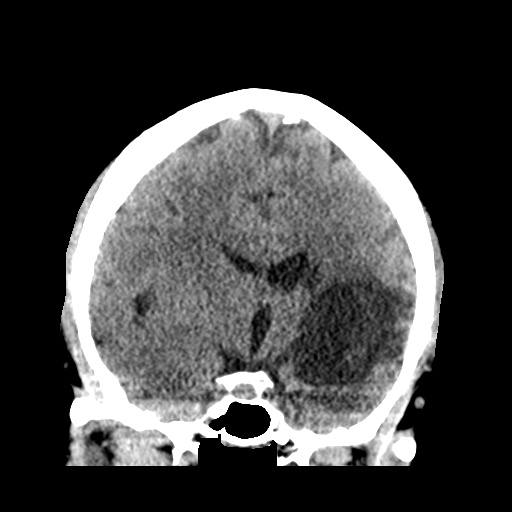

[Series 5: sagittal soft · sagittal · 0.34mm/px · 3 of 57 slices shown]
[im 19/57  brain]
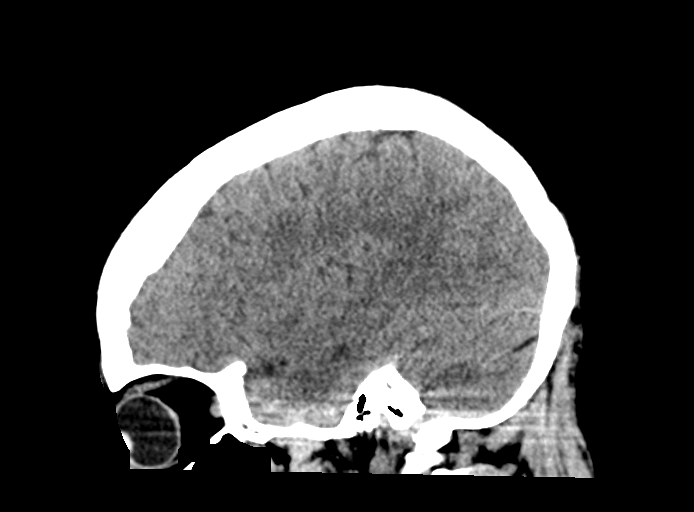
[im 29/57  brain]
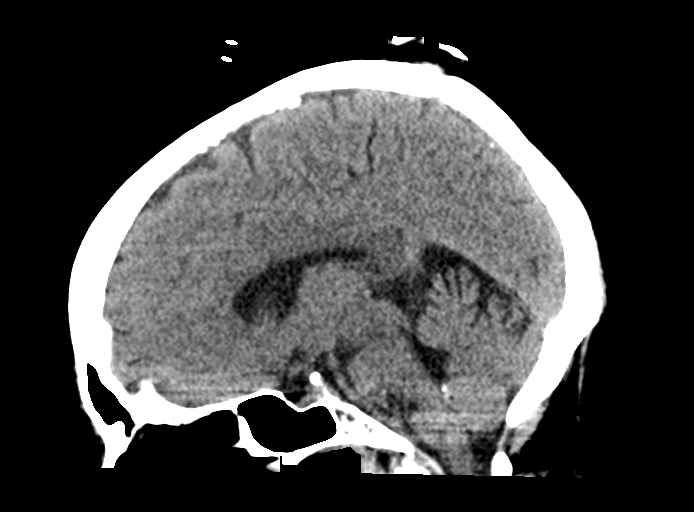
[im 38/57  brain]
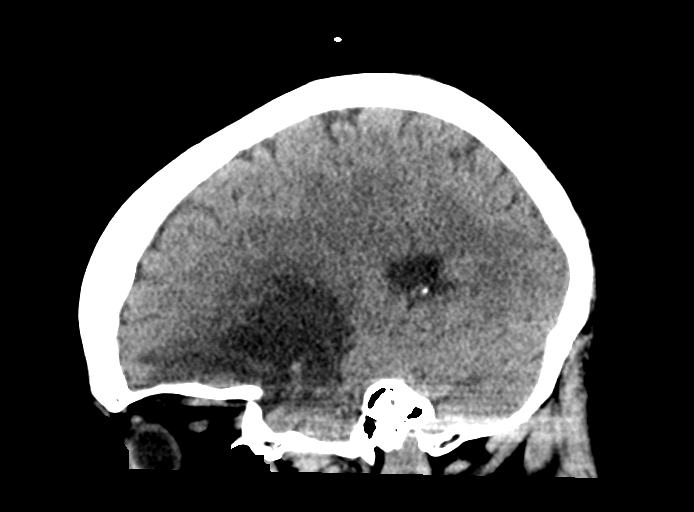

[16 of 47 positions shown; findings below may reference images not displayed]

FINDINGS: Brain: No evidence of acute infarction, hemorrhage, hydrocephalus,
extra-axial collection or mass lesion/mass effect.

Large remote left MCA infarct again seen.

Vascular: No hyperdense vessel or unexpected calcification.

Skull: Normal. Negative for fracture or focal lesion.

Sinuses/Orbits: No acute finding.

Other: None.
IMPRESSION: No acute intracranial abnormality

## 2020-09-07 MED ORDER — CEPHALEXIN 500 MG PO CAPS: 500.0000 mg | ORAL_CAPSULE | Freq: Four times a day (QID) | ORAL | 0 refills | Status: DC

## 2020-09-07 NOTE — Discharge Instructions (Signed)
You were seen in the emergency department for being more lethargic and having an episode of urinating about.  Your urine showed possible signs of infection and we are treating you with antibiotics.  Your Covid test was also positive.  You may qualify for additional therapy and they will call you in the next day or 2 if you do.  There is also a Covid clinic in [place].  Return to the emergency department if any worsening or concerning symptoms.

## 2020-09-07 NOTE — ED Triage Notes (Signed)
Per family pt has not been acting normal all day. Pt has been lying in bed and not ambulating. Pt has had recent stroke and is aphasic.

## 2020-09-07 NOTE — ED Notes (Signed)
Date and time results received: 09/07/20 1437 (use smartphrase ".now" to insert current time)  Test:covid Critical Value: positive Name of Provider Notified: Dr Melina Copa Orders Received? Or Actions Taken?:NA

## 2020-09-07 NOTE — ED Notes (Signed)
Date and time results received: 09/07/20 0239 (use smartphrase ".now" to insert current time)  Test: Covid Critical Value: positive  Name of Provider Notified: Dr Melina Copa  Orders Received? Or Actions Taken?: Actions Taken: no orders received
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
## 2020-09-08 ENCOUNTER — Encounter: Payer: Self-pay | Admitting: Oncology

## 2020-09-08 ENCOUNTER — Other Ambulatory Visit: Payer: Self-pay | Admitting: Oncology

## 2020-09-08 MED ORDER — MOLNUPIRAVIR EUA 200MG CAPSULE: 4.0000 | ORAL_CAPSULE | Freq: Two times a day (BID) | ORAL | 0 refills | Status: AC

## 2020-09-08 MED ORDER — MOLNUPIRAVIR EUA 200MG CAPSULE: 4.0000 | ORAL_CAPSULE | Freq: Two times a day (BID) | ORAL | 0 refills | Status: DC

## 2020-09-08 NOTE — Progress Notes (Signed)
Outpatient Oral COVID Treatment Note  I connected with Robin [name] on 09/08/2020/12:23 PM by telephone and verified that I am speaking with the correct Robin [name] using two identifiers.  I discussed the limitations, risks, security, and privacy concerns of performing an evaluation and management service by telephone and the availability of in [place] appointments. I also discussed with the patient that there may be a patient responsible charge related to this service. The patient expressed understanding and agreed to proceed.  Patient unable to speak on phone. I spoke with sister.   Patient location: Home Provider location: Clinic   Diagnosis: COVID-19 infection  Purpose of visit: Discussion of potential use of Molnupiravir or Paxlovid, a new treatment for mild to moderate COVID-19 viral infection in non-hospitalized patients.   Subjective: Patient is a 55 y.o. female who has been diagnosed with COVID 19 viral infection.  Their symptoms began on 09/07/20  Past Medical History:  Diagnosis Date  . A-fib ([place])   . Allergy   . Anemia   . Arthritis   . CHF (congestive heart failure) ([place])    a. EF at 30-35% by echo in 08/2018 b. EF at 35% by repeat echo in 05/2019  . Chronic abdominal pain   . Chronic atrial fibrillation ([place])   . Cocaine abuse ([place])   . COPD (chronic obstructive pulmonary disease) ([place])   . Essential hypertension, benign   . Expressive aphasia   . Expressive aphasia    post CVA  . Fatty liver   . GERD (gastroesophageal reflux disease)   . Gout 2016  . Normal coronary arteries    3/10 - following abnormal Myoview  . Ovarian cyst   . Stroke ([place]) 12/26/2019   Right sided weakness, and expressive aphasia  . Thoracic ascending aortic aneurysm (HCC)    4 cm 10/31/19 CTA  . Type 2 diabetes mellitus (HCC)    type II    Allergies  Allergen Reactions  . Bee Venom Shortness Of Breath and Swelling    Bodily Swelling  . Losartan Other (See Comments)    Nosebleeds per  patient report.   . Naproxen Other (See Comments)    Acid reflux  . Penicillins Nausea Only    Has patient had a PCN reaction causing immediate rash, facial/tongue/throat swelling, SOB or lightheadedness with hypotension: no Has patient had a PCN reaction causing severe rash involving mucus membranes or skin necrosis: no Has patient had a PCN reaction that required hospitalization no Has patient had a PCN reaction occurring within the last 10 years: no If all of the above answers are "NO", then may proceed with Cephalosporin   . Lisinopril     Sinus congestion     Current Outpatient Medications:  .  acetaminophen (TYLENOL) 325 MG tablet, Take 650 mg by mouth every 8 (eight) hours as needed for mild pain., Disp: , Rfl:  .  apixaban (ELIQUIS) 5 MG TABS tablet, Take 1 tablet (5 mg total) by mouth 2 (two) times daily., Disp: 60 tablet, Rfl: 11 .  aspirin EC 81 MG tablet, Take 1 tablet (81 mg total) by mouth daily. Swallow whole., Disp: 90 tablet, Rfl: 3 .  BD PEN NEEDLE NANO U/F 32G X 4 MM MISC, Inject into the skin as directed., Disp: , Rfl:  .  carvedilol (COREG) 25 MG tablet, Take 1 tablet (25 mg total) by mouth 2 (two) times daily with a meal., Disp: 60 tablet, Rfl: 2 .  cephALEXin (KEFLEX) 500 MG capsule, Take 1  capsule (500 mg total) by mouth 4 (four) times daily., Disp: 28 capsule, Rfl: 0 .  citalopram (CELEXA) 10 MG tablet, Take 1 tablet by mouth once daily, Disp: 30 tablet, Rfl: 0 .  furosemide (LASIX) 20 MG tablet, Take 1 tablet (20 mg total) by mouth daily., Disp: 30 tablet, Rfl: 11 .  gabapentin (NEURONTIN) 100 MG capsule, Take 1 capsule (100 mg total) by mouth 3 (three) times daily., Disp: 270 capsule, Rfl: 3 .  hydrALAZINE (APRESOLINE) 25 MG tablet, Take 1 tablet (25 mg total) by mouth 3 (three) times daily., Disp: 120 tablet, Rfl: 11 .  insulin glargine (LANTUS) 100 UNIT/ML injection, Inject 10-14 Units into the skin at bedtime. If over 250 give another 2 units, Disp: , Rfl:  .   isosorbide mononitrate (IMDUR) 30 MG 24 hr tablet, Take 2 tablets (60 mg total) by mouth daily., Disp: 60 tablet, Rfl: 11 .  nitroGLYCERIN (NITROSTAT) 0.4 MG SL tablet, Place 1 tablet (0.4 mg total) under the tongue every 5 (five) minutes as needed for chest pain., Disp: 30 tablet, Rfl: 12 .  pantoprazole (PROTONIX) 40 MG tablet, Take 40 mg by mouth daily., Disp: , Rfl:  .  rosuvastatin (CRESTOR) 20 MG tablet, Take 1 tablet by mouth once daily, Disp: 30 tablet, Rfl: 0  Objective: Patient sounds stable per sister.  They are in no apparent distress.  Breathing is non labored.  Mood and behavior are normal.  Laboratory Data:  Recent Results (from the past 2160 hour(s))  Lipid panel     Status: Abnormal   Collection Time: 07/05/20  1:59 PM  Result Value Ref Range   Cholesterol 58 <200 mg/dL   HDL 28 (L) > OR = 50 mg/dL   Triglycerides 51 <150 mg/dL   LDL Cholesterol (Calc) 17 mg/dL (calc)    Comment: Reference range: <100 . Desirable range <100 mg/dL for primary prevention;   <70 mg/dL for patients with CHD or diabetic patients  with > or = 2 CHD risk factors. Marland Kitchen LDL-C is now calculated using the Martin-Hopkins  calculation, which is a validated novel method providing  better accuracy than the Friedewald equation in the  estimation of LDL-C.  Cresenciano Genre et al. Annamaria Helling. [id number]): 2061-2068  (http://education.[employer].com/faq/FAQ164)    Total CHOL/HDL Ratio 2.1 <5.0 (calc)   Non-HDL Cholesterol (Calc) 30 <130 mg/dL (calc)    Comment: For patients with diabetes plus 1 major ASCVD risk  factor, treating to a non-HDL-C goal of <100 mg/dL  (LDL-C of <70 mg/dL) is considered a therapeutic  option.   Comprehensive metabolic panel     Status: Abnormal   Collection Time: 07/05/20  1:59 PM  Result Value Ref Range   Glucose, Bld 147 (H) 65 - 99 mg/dL    Comment: .            Fasting reference interval . For someone without known diabetes, a glucose value >125 mg/dL indicates that  they may have diabetes and this should be confirmed with a follow-up test. .    BUN 19 7 - 25 mg/dL   Creat 1.76 (H) 0.50 - 1.05 mg/dL    Comment: For patients >55 years of age, the reference limit for Creatinine is approximately 13% higher for people identified as African-American. .    BUN/Creatinine Ratio 11 6 - 22 (calc)   Sodium 140 135 - 146 mmol/L   Potassium 4.2 3.5 - 5.3 mmol/L   Chloride 110 98 - 110 mmol/L   CO2 24 20 -  32 mmol/L   Calcium 9.0 8.6 - 10.4 mg/dL   Total Protein 6.3 6.1 - 8.1 g/dL   Albumin 3.8 3.6 - 5.1 g/dL   Globulin 2.5 1.9 - 3.7 g/dL (calc)   AG Ratio 1.5 1.0 - 2.5 (calc)   Total Bilirubin 0.5 0.2 - 1.2 mg/dL   Alkaline phosphatase (APISO) 71 37 - 153 U/L   AST 10 10 - 35 U/L   ALT 7 6 - 29 U/L  CBC with Differential/Platelet     Status: Abnormal   Collection Time: 07/05/20  1:59 PM  Result Value Ref Range   WBC 5.7 3.8 - 10.8 Thousand/uL   RBC 3.12 (L) 3.80 - 5.10 Million/uL   Hemoglobin 8.7 (L) 11.7 - 15.5 g/dL   HCT 00.8 (L) 67.6 - 19.5 %   MCV 88.5 80.0 - 100.0 fL   MCH 27.9 27.0 - 33.0 pg   MCHC 31.5 (L) 32.0 - 36.0 g/dL   RDW 09.3 26.7 - 12.4 %   Platelets 241 140 - 400 Thousand/uL   MPV 10.7 7.5 - 12.5 fL   Neutro Abs 2,918 1,500 - 7,800 cells/uL   Lymphs Abs 2,041 850 - 3,900 cells/uL   Absolute Monocytes 490 200 - 950 cells/uL   Eosinophils Absolute 211 15 - 500 cells/uL   Basophils Absolute 40 0 - 200 cells/uL   Neutrophils Relative % 51.2 %   Total Lymphocyte 35.8 %   Monocytes Relative 8.6 %   Eosinophils Relative 3.7 %   Basophils Relative 0.7 %  Urine rapid drug screen (hosp performed)     Status: None   Collection Time: 07/09/20  6:24 AM  Result Value Ref Range   Opiates NONE DETECTED NONE DETECTED   Cocaine NONE DETECTED NONE DETECTED   Benzodiazepines NONE DETECTED NONE DETECTED   Amphetamines NONE DETECTED NONE DETECTED   Tetrahydrocannabinol NONE DETECTED NONE DETECTED   Barbiturates NONE DETECTED NONE DETECTED     Comment: (NOTE) DRUG SCREEN FOR MEDICAL PURPOSES ONLY.  IF CONFIRMATION IS NEEDED FOR ANY PURPOSE, NOTIFY LAB WITHIN 5 DAYS.  LOWEST DETECTABLE LIMITS FOR URINE DRUG SCREEN Drug Class                     Cutoff (ng/mL) Amphetamine and metabolites    1000 Barbiturate and metabolites    200 Benzodiazepine                 200 Tricyclics and metabolites     300 Opiates and metabolites        300 Cocaine and metabolites        300 THC                            50 Performed at [facility], [address], [place], [name] [address]   Comprehensive metabolic panel     Status: Abnormal   Collection Time: 07/09/20  6:31 AM  Result Value Ref Range   Sodium 136 135 - 145 mmol/L   Potassium 3.9 3.5 - 5.1 mmol/L   Chloride 107 98 - 111 mmol/L   CO2 22 22 - 32 mmol/L   Glucose, Bld 166 (H) 70 - 99 mg/dL    Comment: Glucose reference range applies only to samples taken after fasting for at least 8 hours.   BUN 20 6 - 20 mg/dL   Creatinine, Ser 8.33 (H) 0.44 - 1.00 mg/dL   Calcium 9.1 8.9 - 10.3  mg/dL   Total Protein 7.4 6.5 - 8.1 g/dL   Albumin 4.0 3.5 - 5.0 g/dL   AST 14 (L) 15 - 41 U/L   ALT 13 0 - 44 U/L   Alkaline Phosphatase 81 38 - 126 U/L   Total Bilirubin 0.9 0.3 - 1.2 mg/dL   GFR, Estimated 37 (L) >60 mL/min    Comment: (NOTE) Calculated using the CKD-EPI Creatinine Equation (2021)    Anion gap 7 5 - 15    Comment: Performed at [facility], [address], [place], [place] [address]  Lipase, blood     Status: None   Collection Time: 07/09/20  6:31 AM  Result Value Ref Range   Lipase 28 11 - 51 U/L    Comment: Performed at [facility], [address] [place], [place] [address]  CBC with Differential     Status: Abnormal   Collection Time: 07/09/20  6:31 AM  Result Value Ref Range   WBC 5.6 4.0 - 10.5 K/uL   RBC 3.48 (L) 3.87 - 5.11 MIL/uL   Hemoglobin 9.6 (L) 12.0 - 15.0 g/dL   HCT 30.9 (L) 36.0 - 46.0 %   MCV 88.8 80.0 - 100.0 fL   MCH 27.6 26.0 - 34.0 pg    MCHC 31.1 30.0 - 36.0 g/dL   RDW 14.2 11.5 - 15.5 %   Platelets 239 150 - 400 K/uL   nRBC 0.0 0.0 - 0.2 %   Neutrophils Relative % 61 %   Neutro Abs 3.5 1.7 - 7.7 K/uL   Lymphocytes Relative 28 %   Lymphs Abs 1.5 0.7 - 4.0 K/uL   Monocytes Relative 7 %   Monocytes Absolute 0.4 0.1 - 1.0 K/uL   Eosinophils Relative 3 %   Eosinophils Absolute 0.2 0.0 - 0.5 K/uL   Basophils Relative 1 %   Basophils Absolute 0.0 0.0 - 0.1 K/uL   Immature Granulocytes 0 %   Abs Immature Granulocytes 0.02 0.00 - 0.07 K/uL    Comment: Performed at [facility], [address], [place], [place] [address]  Brain natriuretic peptide     Status: Abnormal   Collection Time: 07/09/20  6:31 AM  Result Value Ref Range   B Natriuretic Peptide 644.0 (H) 0.0 - 100.0 pg/mL    Comment: Performed at [facility], [address], [place], [place] [address]  Urinalysis, Routine w reflex microscopic Urine, Clean Catch     Status: Abnormal   Collection Time: 07/09/20  9:53 AM  Result Value Ref Range   Color, Urine STRAW (A) YELLOW   APPearance CLEAR CLEAR   Specific Gravity, Urine 1.027 1.005 - 1.030   pH 7.0 5.0 - 8.0   Glucose, UA 50 (A) NEGATIVE mg/dL   Hgb urine dipstick NEGATIVE NEGATIVE   Bilirubin Urine NEGATIVE NEGATIVE   Ketones, ur NEGATIVE NEGATIVE mg/dL   Protein, ur NEGATIVE NEGATIVE mg/dL   Nitrite NEGATIVE NEGATIVE   Leukocytes,Ua TRACE (A) NEGATIVE   RBC / HPF 0-5 0 - 5 RBC/hpf   WBC, UA 11-20 0 - 5 WBC/hpf   Bacteria, UA RARE (A) NONE SEEN   Squamous Epithelial / LPF 0-5 0 - 5   Mucus PRESENT     Comment: Performed at [facility], [address], [place], [place] [address]  Troponin I (High Sensitivity)     Status: None   Collection Time: 07/09/20 11:15 AM  Result Value Ref Range   Troponin I (High Sensitivity) 5 <18 ng/L    Comment: (  NOTE) Elevated high sensitivity troponin I (hsTnI) values and significant  changes across serial measurements may suggest ACS but many other  chronic and acute  conditions are known to elevate hsTnI results.  Refer to the "Links" section for chest pain algorithms and additional  guidance. Performed at [facility], [address] [place], [place] [address]   D-dimer, quantitative (not at [facility])     Status: Abnormal   Collection Time: 07/09/20 11:15 AM  Result Value Ref Range   D-Dimer, Quant 1.09 (H) 0.00 - 0.50 ug/mL-FEU    Comment: (NOTE) At the manufacturer cut-off value of 0.5 g/mL FEU, this assay has a negative predictive value of 95-100%.This assay is intended for use in conjunction with a clinical pretest probability (PTP) assessment model to exclude pulmonary embolism (PE) and deep venous thrombosis (DVT) in outpatients suspected of PE or DVT. Results should be correlated with clinical presentation. Performed at [facility], [address], [place], [place] [address]   Resp Panel by RT-PCR (Flu A&B, Covid) Nasopharyngeal Swab     Status: None   Collection Time: 07/09/20 12:08 PM   Specimen: Nasopharyngeal Swab; Nasopharyngeal(NP) swabs in vial transport medium  Result Value Ref Range   SARS Coronavirus 2 by RT PCR NEGATIVE NEGATIVE    Comment: (NOTE) SARS-CoV-2 target nucleic acids are NOT DETECTED.  The SARS-CoV-2 RNA is generally detectable in upper respiratory specimens during the acute phase of infection. The lowest concentration of SARS-CoV-2 viral copies this assay can detect is 138 copies/mL. A negative result does not preclude SARS-Cov-2 infection and should not be used as the sole basis for treatment or other patient management decisions. A negative result may occur with  improper specimen collection/handling, submission of specimen other than nasopharyngeal swab, presence of viral mutation(s) within the areas targeted by this assay, and inadequate number of viral copies(<138 copies/mL). A negative result must be combined with clinical observations, patient history, and epidemiological information. The expected result is  Negative.  Fact Sheet for Patients:  EntrepreneurPulse.com.au  Fact Sheet for Healthcare Providers:  IncredibleEmployment.be  This test is no t yet approved or cleared by the [place] FDA and  has been authorized for detection and/or diagnosis of SARS-CoV-2 by FDA under an Emergency Use Authorization (EUA). This EUA will remain  in effect (meaning this test can be used) for the duration of the COVID-19 declaration under Section 564(b)(1) of the Act, 21 U.S.C.section 360bbb-3(b)(1), unless the authorization is terminated  or revoked sooner.       Influenza A by PCR NEGATIVE NEGATIVE   Influenza B by PCR NEGATIVE NEGATIVE    Comment: (NOTE) The Xpert Xpress SARS-CoV-2/FLU/RSV plus assay is intended as an aid in the diagnosis of influenza from Nasopharyngeal swab specimens and should not be used as a sole basis for treatment. Nasal washings and aspirates are unacceptable for Xpert Xpress SARS-CoV-2/FLU/RSV testing.  Fact Sheet for Patients: EntrepreneurPulse.com.au  Fact Sheet for Healthcare Providers: IncredibleEmployment.be  This test is not yet approved or cleared by the [place] FDA and has been authorized for detection and/or diagnosis of SARS-CoV-2 by FDA under an Emergency Use Authorization (EUA). This EUA will remain in effect (meaning this test can be used) for the duration of the COVID-19 declaration under Section 564(b)(1) of the Act, 21 U.S.C. section 360bbb-3(b)(1), unless the authorization is terminated or revoked.  Performed at [facility], [address], [place], [place] [address]   Troponin I (High Sensitivity)     Status: None   Collection  Time: 07/09/20  1:44 PM  Result Value Ref Range   Troponin I (High Sensitivity) 5 <18 ng/L    Comment: (NOTE) Elevated high sensitivity troponin I (hsTnI) values and significant  changes across serial measurements may suggest ACS but many  other  chronic and acute conditions are known to elevate hsTnI results.  Refer to the "Links" section for chest pain algorithms and additional  guidance. Performed at [facility], [address], [place], [place] [address]   Urine Culture     Status: None   Collection Time: 07/09/20  2:15 PM   Specimen: Urine, Clean Catch  Result Value Ref Range   Specimen Description      URINE, CLEAN CATCH Performed at [facility], [address], [place], [place] [address]    Special Requests      NONE Performed at [facility], [address], [place], [place] [address]    Culture      NO GROWTH Performed at [facility], [place] [address] [place], [place] [address]    Report Status 07/11/2020 FINAL   Glucose, capillary     Status: Abnormal   Collection Time: 07/09/20  4:58 PM  Result Value Ref Range   Glucose-Capillary 151 (H) 70 - 99 mg/dL    Comment: Glucose reference range applies only to samples taken after fasting for at least 8 hours.  Glucose, capillary     Status: Abnormal   Collection Time: 07/09/20  9:31 PM  Result Value Ref Range   Glucose-Capillary 111 (H) 70 - 99 mg/dL    Comment: Glucose reference range applies only to samples taken after fasting for at least 8 hours.  Glucose, capillary     Status: Abnormal   Collection Time: 07/10/20  9:22 AM  Result Value Ref Range   Glucose-Capillary 146 (H) 70 - 99 mg/dL    Comment: Glucose reference range applies only to samples taken after fasting for at least 8 hours.  Magnesium     Status: None   Collection Time: 07/10/20 10:13 AM  Result Value Ref Range   Magnesium 1.8 1.7 - 2.4 mg/dL    Comment: Performed at [facility], [address], [place], [place] [address]  Hemoglobin A1c     Status: Abnormal   Collection Time: 07/10/20 10:13 AM  Result Value Ref Range   Hgb A1c MFr Bld 7.2 (H) 4.8 - 5.6 %    Comment: (NOTE) Pre diabetes:          5.7%-6.4%  Diabetes:              >6.4%  Glycemic control for   <7.0% adults  with diabetes    Mean Plasma Glucose 159.94 mg/dL    Comment: Performed at [facility] [address] [place], [place] [address]  ECHOCARDIOGRAM LIMITED     Status: None   Collection Time: 07/10/20 10:47 AM  Result Value Ref Range   Weight 3,121.71 oz   Height 67 in   BP 159/107 mmHg   Area-P 1/2 3.05 cm2   S' Lateral 3.26 cm  Lactic acid, plasma     Status: None   Collection Time: 08/14/20  5:01 PM  Result Value Ref Range   Lactic Acid, Venous 1.9 0.5 - 1.9 mmol/L    Comment: Performed at [facility], [address] [place], [place] [address]  Comprehensive metabolic panel     Status: Abnormal   Collection Time: 08/14/20  5:01 PM  Result Value Ref  Range   Sodium 135 135 - 145 mmol/L   Potassium 3.6 3.5 - 5.1 mmol/L   Chloride 104 98 - 111 mmol/L   CO2 22 22 - 32 mmol/L   Glucose, Bld 187 (H) 70 - 99 mg/dL    Comment: Glucose reference range applies only to samples taken after fasting for at least 8 hours.   BUN 20 6 - 20 mg/dL   Creatinine, Ser 1.83 (H) 0.44 - 1.00 mg/dL   Calcium 9.0 8.9 - 10.3 mg/dL   Total Protein 7.6 6.5 - 8.1 g/dL   Albumin 3.9 3.5 - 5.0 g/dL   AST 15 15 - 41 U/L   ALT 11 0 - 44 U/L   Alkaline Phosphatase 85 38 - 126 U/L   Total Bilirubin 0.6 0.3 - 1.2 mg/dL   GFR, Estimated 32 (L) >60 mL/min    Comment: (NOTE) Calculated using the CKD-EPI Creatinine Equation (2021)    Anion gap 9 5 - 15    Comment: Performed at [facility], [address], [place], [place] [address]  CBC WITH DIFFERENTIAL     Status: Abnormal   Collection Time: 08/14/20  5:01 PM  Result Value Ref Range   WBC 10.9 (H) 4.0 - 10.5 K/uL   RBC 4.19 3.87 - 5.11 MIL/uL   Hemoglobin 10.7 (L) 12.0 - 15.0 g/dL   HCT 35.2 (L) 36.0 - 46.0 %   MCV 84.0 80.0 - 100.0 fL   MCH 25.5 (L) 26.0 - 34.0 pg   MCHC 30.4 30.0 - 36.0 g/dL   RDW 13.8 11.5 - 15.5 %   Platelets 296 150 - 400 K/uL   nRBC 0.0 0.0 - 0.2 %   Neutrophils Relative % 77 %   Neutro Abs 8.4 (H) 1.7 - 7.7 K/uL    Lymphocytes Relative 17 %   Lymphs Abs 1.8 0.7 - 4.0 K/uL   Monocytes Relative 5 %   Monocytes Absolute 0.5 0.1 - 1.0 K/uL   Eosinophils Relative 1 %   Eosinophils Absolute 0.1 0.0 - 0.5 K/uL   Basophils Relative 0 %   Basophils Absolute 0.0 0.0 - 0.1 K/uL   Immature Granulocytes 0 %   Abs Immature Granulocytes 0.03 0.00 - 0.07 K/uL    Comment: Performed at [facility], [address], [place], [place] [address]  Protime-INR     Status: Abnormal   Collection Time: 08/14/20  5:01 PM  Result Value Ref Range   Prothrombin Time 16.3 (H) 11.4 - 15.2 seconds   INR 1.4 (H) 0.8 - 1.2    Comment: (NOTE) INR goal varies based on device and disease states. Performed at [facility], [address] [place], [place] [address]   APTT     Status: None   Collection Time: 08/14/20  5:01 PM  Result Value Ref Range   aPTT 29 24 - 36 seconds    Comment: Performed at [facility], [address], [place], [place] [address]  Blood Culture (routine x 2)     Status: None   Collection Time: 08/14/20  5:01 PM   Specimen: BLOOD LEFT FOREARM  Result Value Ref Range   Specimen Description BLOOD LEFT FOREARM    Special Requests      BOTTLES DRAWN AEROBIC AND ANAEROBIC Blood Culture adequate volume   Culture      NO GROWTH 5 DAYS Performed at [facility], [address], [place], [place] [address]    Report Status 08/19/2020 FINAL   Urinalysis, Routine w  reflex microscopic Urine, Clean Catch     Status: Abnormal   Collection Time: 08/14/20  5:01 PM  Result Value Ref Range   Color, Urine YELLOW YELLOW   APPearance CLEAR CLEAR   Specific Gravity, Urine 1.013 1.005 - 1.030   pH 6.0 5.0 - 8.0   Glucose, UA 50 (A) NEGATIVE mg/dL   Hgb urine dipstick NEGATIVE NEGATIVE   Bilirubin Urine NEGATIVE NEGATIVE   Ketones, ur NEGATIVE NEGATIVE mg/dL   Protein, ur 30 (A) NEGATIVE mg/dL   Nitrite NEGATIVE NEGATIVE   Leukocytes,Ua SMALL (A) NEGATIVE   RBC / HPF 0-5 0 - 5 RBC/hpf   WBC, UA 11-20 0 - 5 WBC/hpf    Bacteria, UA FEW (A) NONE SEEN   Squamous Epithelial / LPF 0-5 0 - 5   Mucus PRESENT     Comment: Performed at [facility], [address] [place], [place] [address]  Urine culture     Status: Abnormal   Collection Time: 08/14/20  5:01 PM   Specimen: In/Out Cath Urine  Result Value Ref Range   Specimen Description      IN/OUT CATH URINE Performed at [facility], [address] [place], [place] [address]    Special Requests      NONE Performed at [facility], [address], [place], [place] [address]    Culture MULTIPLE SPECIES PRESENT, SUGGEST RECOLLECTION (A)    Report Status 08/16/2020 FINAL   Rapid urine drug screen (hospital performed)     Status: None   Collection Time: 08/14/20  5:01 PM  Result Value Ref Range   Opiates NONE DETECTED NONE DETECTED   Cocaine NONE DETECTED NONE DETECTED   Benzodiazepines NONE DETECTED NONE DETECTED   Amphetamines NONE DETECTED NONE DETECTED   Tetrahydrocannabinol NONE DETECTED NONE DETECTED   Barbiturates NONE DETECTED NONE DETECTED    Comment: (NOTE) DRUG SCREEN FOR MEDICAL PURPOSES ONLY.  IF CONFIRMATION IS NEEDED FOR ANY PURPOSE, NOTIFY LAB WITHIN 5 DAYS.  LOWEST DETECTABLE LIMITS FOR URINE DRUG SCREEN Drug Class                     Cutoff (ng/mL) Amphetamine and metabolites    1000 Barbiturate and metabolites    200 Benzodiazepine                 [id number] Tricyclics and metabolites     300 Opiates and metabolites        300 Cocaine and metabolites        300 THC                            50 Performed at [facility], [address] [place], [place] [address]   Magnesium     Status: None   Collection Time: 08/14/20  5:01 PM  Result Value Ref Range   Magnesium 1.9 1.7 - 2.4 mg/dL    Comment: Performed at [facility], [address], [place], [place] [address]  Resp Panel by RT-PCR (Flu A&B, Covid) Nasopharyngeal Swab     Status: None   Collection Time: 08/14/20  5:02 PM   Specimen: Nasopharyngeal Swab; Nasopharyngeal(NP) swabs  in vial transport medium  Result Value Ref Range   SARS Coronavirus 2 by RT PCR NEGATIVE NEGATIVE    Comment: (NOTE) SARS-CoV-2 target nucleic acids are NOT DETECTED.  The SARS-CoV-2 RNA is generally detectable in upper respiratory specimens during the acute phase of infection. The lowest  concentration of SARS-CoV-2 viral copies this assay can detect is 138 copies/mL. A negative result does not preclude SARS-Cov-2 infection and should not be used as the sole basis for treatment or other patient management decisions. A negative result may occur with  improper specimen collection/handling, submission of specimen other than nasopharyngeal swab, presence of viral mutation(s) within the areas targeted by this assay, and inadequate number of viral copies(<138 copies/mL). A negative result must be combined with clinical observations, patient history, and epidemiological information. The expected result is Negative.  Fact Sheet for Patients:  EntrepreneurPulse.com.au  Fact Sheet for Healthcare Providers:  IncredibleEmployment.be  This test is no t yet approved or cleared by the [place] FDA and  has been authorized for detection and/or diagnosis of SARS-CoV-2 by FDA under an Emergency Use Authorization (EUA). This EUA will remain  in effect (meaning this test can be used) for the duration of the COVID-19 declaration under Section 564(b)(1) of the Act, 21 U.S.C.section 360bbb-3(b)(1), unless the authorization is terminated  or revoked sooner.       Influenza A by PCR NEGATIVE NEGATIVE   Influenza B by PCR NEGATIVE NEGATIVE    Comment: (NOTE) The Xpert Xpress SARS-CoV-2/FLU/RSV plus assay is intended as an aid in the diagnosis of influenza from Nasopharyngeal swab specimens and should not be used as a sole basis for treatment. Nasal washings and aspirates are unacceptable for Xpert Xpress SARS-CoV-2/FLU/RSV testing.  Fact Sheet for  Patients: EntrepreneurPulse.com.au  Fact Sheet for Healthcare Providers: IncredibleEmployment.be  This test is not yet approved or cleared by the [place] FDA and has been authorized for detection and/or diagnosis of SARS-CoV-2 by FDA under an Emergency Use Authorization (EUA). This EUA will remain in effect (meaning this test can be used) for the duration of the COVID-19 declaration under Section 564(b)(1) of the Act, 21 U.S.C. section 360bbb-3(b)(1), unless the authorization is terminated or revoked.  Performed at [facility], [address], [place], [place] [address]   Lactic acid, plasma     Status: None   Collection Time: 08/14/20  7:22 PM  Result Value Ref Range   Lactic Acid, Venous 1.1 0.5 - 1.9 mmol/L    Comment: Performed at [facility], [address] [place], [place] [address]  Blood Culture (routine x 2)     Status: None   Collection Time: 08/14/20  7:22 PM   Specimen: BLOOD LEFT HAND  Result Value Ref Range   Specimen Description BLOOD LEFT HAND    Special Requests      BOTTLES DRAWN AEROBIC AND ANAEROBIC Blood Culture adequate volume   Culture      NO GROWTH 5 DAYS Performed at [facility], [address] [place], [place] [address]    Report Status 08/19/2020 FINAL   CBG monitoring, ED     Status: Abnormal   Collection Time: 08/14/20 11:11 PM  Result Value Ref Range   Glucose-Capillary 219 (H) 70 - 99 mg/dL    Comment: Glucose reference range applies only to samples taken after fasting for at least 8 hours.  MRSA PCR Screening     Status: Abnormal   Collection Time: 08/15/20 12:43 AM   Specimen: Nasal Mucosa; Nasopharyngeal  Result Value Ref Range   MRSA by PCR (A) NEGATIVE    INVALID, UNABLE TO DETERMINE THE PRESENCE OF TARGET DUE TO SPECIMEN INTEGRITY. RECOLLECTION REQUESTED.    Comment: Performed at [facility], [address], [place], [place] [address]  CBC     Status: Abnormal  Collection Time: 08/15/20   4:21 AM  Result Value Ref Range   WBC 12.7 (H) 4.0 - 10.5 K/uL   RBC 3.79 (L) 3.87 - 5.11 MIL/uL   Hemoglobin 9.9 (L) 12.0 - 15.0 g/dL   HCT 32.0 (L) 36.0 - 46.0 %   MCV 84.4 80.0 - 100.0 fL   MCH 26.1 26.0 - 34.0 pg   MCHC 30.9 30.0 - 36.0 g/dL   RDW 14.0 11.5 - 15.5 %   Platelets 247 150 - 400 K/uL   nRBC 0.0 0.0 - 0.2 %    Comment: Performed at [facility], [address], [place], [place] [address]  Comprehensive metabolic panel     Status: Abnormal   Collection Time: 08/15/20  4:21 AM  Result Value Ref Range   Sodium 136 135 - 145 mmol/L   Potassium 3.4 (L) 3.5 - 5.1 mmol/L   Chloride 105 98 - 111 mmol/L   CO2 22 22 - 32 mmol/L   Glucose, Bld 179 (H) 70 - 99 mg/dL    Comment: Glucose reference range applies only to samples taken after fasting for at least 8 hours.   BUN 20 6 - 20 mg/dL   Creatinine, Ser 1.62 (H) 0.44 - 1.00 mg/dL   Calcium 8.9 8.9 - 10.3 mg/dL   Total Protein 6.8 6.5 - 8.1 g/dL   Albumin 3.5 3.5 - 5.0 g/dL   AST 12 (L) 15 - 41 U/L   ALT 10 0 - 44 U/L   Alkaline Phosphatase 75 38 - 126 U/L   Total Bilirubin 0.7 0.3 - 1.2 mg/dL   GFR, Estimated 38 (L) >60 mL/min    Comment: (NOTE) Calculated using the CKD-EPI Creatinine Equation (2021)    Anion gap 9 5 - 15    Comment: Performed at [facility], [address] [place], [place] [address]  HIV Antibody (routine testing w rflx)     Status: None   Collection Time: 08/15/20  4:21 AM  Result Value Ref Range   HIV Screen 4th Generation wRfx Non Reactive Non Reactive    Comment: Performed at [facility], [place] [address], [place],  [address]  Glucose, capillary     Status: Abnormal   Collection Time: 08/15/20  7:50 AM  Result Value Ref Range   Glucose-Capillary 173 (H) 70 - 99 mg/dL    Comment: Glucose reference range applies only to samples taken after fasting for at least 8 hours.  Glucose, capillary     Status: Abnormal   Collection Time: 08/15/20 11:52 AM  Result Value Ref Range    Glucose-Capillary 339 (H) 70 - 99 mg/dL    Comment: Glucose reference range applies only to samples taken after fasting for at least 8 hours.  Glucose, capillary     Status: Abnormal   Collection Time: 08/15/20  4:20 PM  Result Value Ref Range   Glucose-Capillary 188 (H) 70 - 99 mg/dL    Comment: Glucose reference range applies only to samples taken after fasting for at least 8 hours.  Glucose, capillary     Status: Abnormal   Collection Time: 08/15/20  9:44 PM  Result Value Ref Range   Glucose-Capillary 203 (H) 70 - 99 mg/dL    Comment: Glucose reference range applies only to samples taken after fasting for at least 8 hours.   Comment 1 Notify RN    Comment 2 Document in Chart   CBC     Status: Abnormal   Collection Time: 08/16/20  5:09 AM  Result Value Ref Range   WBC 9.8 4.0 - 10.5 K/uL   RBC 3.52 (L) 3.87 - 5.11 MIL/uL   Hemoglobin 9.2 (L) 12.0 - 15.0 g/dL   HCT 29.6 (L) 36.0 - 46.0 %   MCV 84.1 80.0 - 100.0 fL   MCH 26.1 26.0 - 34.0 pg   MCHC 31.1 30.0 - 36.0 g/dL   RDW 14.1 11.5 - 15.5 %   Platelets 225 150 - 400 K/uL   nRBC 0.0 0.0 - 0.2 %    Comment: Performed at [facility], [address] [place], [place] [id number]  Basic metabolic panel     Status: Abnormal   Collection Time: 08/16/20  5:09 AM  Result Value Ref Range   Sodium 135 135 - 145 mmol/L   Potassium 3.7 3.5 - 5.1 mmol/L   Chloride 103 98 - 111 mmol/L   CO2 22 22 - 32 mmol/L   Glucose, Bld 168 (H) 70 - 99 mg/dL    Comment: Glucose reference range applies only to samples taken after fasting for at least 8 hours.   BUN 23 (H) 6 - 20 mg/dL   Creatinine, Ser 1.83 (H) 0.44 - 1.00 mg/dL   Calcium 8.7 (L) 8.9 - 10.3 mg/dL   GFR, Estimated 32 (L) >60 mL/min    Comment: (NOTE) Calculated using the CKD-EPI Creatinine Equation (2021)    Anion gap 10 5 - 15    Comment: Performed at [facility], [address], [place], [place] [address]  Magnesium     Status: Abnormal   Collection Time: 08/16/20  5:09 AM   Result Value Ref Range   Magnesium 1.6 (L) 1.7 - 2.4 mg/dL    Comment: Performed at [facility], [address], [place], [place] [address]  Glucose, capillary     Status: Abnormal   Collection Time: 08/16/20  8:00 AM  Result Value Ref Range   Glucose-Capillary 210 (H) 70 - 99 mg/dL    Comment: Glucose reference range applies only to samples taken after fasting for at least 8 hours.  Glucose, capillary     Status: Abnormal   Collection Time: 08/16/20 11:24 AM  Result Value Ref Range   Glucose-Capillary 177 (H) 70 - 99 mg/dL    Comment: Glucose reference range applies only to samples taken after fasting for at least 8 hours.  CBC with Differential     Status: Abnormal   Collection Time: 09/07/20 12:43 PM  Result Value Ref Range   WBC 6.5 4.0 - 10.5 K/uL   RBC 4.26 3.87 - 5.11 MIL/uL   Hemoglobin 10.8 (L) 12.0 - 15.0 g/dL   HCT 34.5 (L) 36.0 - 46.0 %   MCV 81.0 80.0 - 100.0 fL   MCH 25.4 (L) 26.0 - 34.0 pg   MCHC 31.3 30.0 - 36.0 g/dL   RDW 13.6 11.5 - 15.5 %   Platelets 230 150 - 400 K/uL   nRBC 0.0 0.0 - 0.2 %   Neutrophils Relative % 63 %   Neutro Abs 4.2 1.7 - 7.7 K/uL   Lymphocytes Relative 24 %   Lymphs Abs 1.5 0.7 - 4.0 K/uL   Monocytes Relative 10 %   Monocytes Absolute 0.6 0.1 - 1.0 K/uL   Eosinophils Relative 2 %   Eosinophils Absolute 0.1 0.0 - 0.5 K/uL   Basophils Relative 1 %   Basophils Absolute 0.0 0.0 - 0.1 K/uL   Immature Granulocytes 0 %   Abs Immature Granulocytes 0.01 0.00 -  0.07 K/uL    Comment: Performed at [facility], [address] [place], [place] [address]  Comprehensive metabolic panel     Status: Abnormal   Collection Time: 09/07/20 12:43 PM  Result Value Ref Range   Sodium 137 135 - 145 mmol/L   Potassium 4.1 3.5 - 5.1 mmol/L   Chloride 103 98 - 111 mmol/L   CO2 25 22 - 32 mmol/L   Glucose, Bld 139 (H) 70 - 99 mg/dL    Comment: Glucose reference range applies only to samples taken after fasting for at least 8 hours.   BUN 20 6 - 20 mg/dL    Creatinine, Ser 1.81 (H) 0.44 - 1.00 mg/dL   Calcium 9.4 8.9 - 10.3 mg/dL   Total Protein 7.6 6.5 - 8.1 g/dL   Albumin 3.8 3.5 - 5.0 g/dL   AST 14 (L) 15 - 41 U/L   ALT 11 0 - 44 U/L   Alkaline Phosphatase 79 38 - 126 U/L   Total Bilirubin 0.7 0.3 - 1.2 mg/dL   GFR, Estimated 33 (L) >60 mL/min    Comment: (NOTE) Calculated using the CKD-EPI Creatinine Equation (2021)    Anion gap 9 5 - 15    Comment: Performed at [facility], [address] [place], [place] [address]  Urinalysis, Routine w reflex microscopic Urine, Clean Catch     Status: Abnormal   Collection Time: 09/07/20 12:43 PM  Result Value Ref Range   Color, Urine YELLOW YELLOW   APPearance CLEAR CLEAR   Specific Gravity, Urine 1.015 1.005 - 1.030   pH 6.0 5.0 - 8.0   Glucose, UA NEGATIVE NEGATIVE mg/dL   Hgb urine dipstick NEGATIVE NEGATIVE   Bilirubin Urine NEGATIVE NEGATIVE   Ketones, ur NEGATIVE NEGATIVE mg/dL   Protein, ur 30 (A) NEGATIVE mg/dL   Nitrite NEGATIVE NEGATIVE   Leukocytes,Ua MODERATE (A) NEGATIVE   RBC / HPF 6-10 0 - 5 RBC/hpf   WBC, UA 11-20 0 - 5 WBC/hpf   Bacteria, UA RARE (A) NONE SEEN   Squamous Epithelial / LPF 0-5 0 - 5    Comment: Performed at [facility], [address], [place], [place] [address]  Brain natriuretic peptide     Status: Abnormal   Collection Time: 09/07/20 12:43 PM  Result Value Ref Range   B Natriuretic Peptide 630.0 (H) 0.0 - 100.0 pg/mL    Comment: Performed at [facility], [address], [place], [place] [address]  CBG monitoring, ED     Status: Abnormal   Collection Time: 09/07/20 12:47 PM  Result Value Ref Range   Glucose-Capillary 125 (H) 70 - 99 mg/dL    Comment: Glucose reference range applies only to samples taken after fasting for at least 8 hours.  SARS Coronavirus 2 by RT PCR (hospital order, performed in [facility] hospital lab) Nasopharyngeal Nasopharyngeal Swab     Status: Abnormal   Collection Time: 09/07/20 12:48 PM   Specimen: Nasopharyngeal Swab   Result Value Ref Range   SARS Coronavirus 2 POSITIVE (A) NEGATIVE    Comment: RESULT CALLED TO, READ BACK BY AND VERIFIED WITH: CRAWFORD,H AT 1435 ON 09/07/20 BY HUFFINES,S. (NOTE) SARS-CoV-2 target nucleic acids are DETECTED  SARS-CoV-2 RNA is generally detectable in upper respiratory specimens  during the acute phase of infection.  Positive results are indicative  of the presence of the identified virus, but do not rule out bacterial infection or co-infection with other pathogens not detected by the test.  Clinical correlation  with patient history and  other diagnostic information is necessary to determine patient infection status.  The expected result is negative.  Fact Sheet for Patients:   StrictlyIdeas.no   Fact Sheet for Healthcare Providers:   BankingDealers.co.za    This test is not yet approved or cleared by the [place] FDA and  has been authorized for detection and/or diagnosis of SARS-CoV-2 by FDA under an Emergency Use Authorization (EUA).  This EUA will remain in effect (mean ing this test can be used) for the duration of  the COVID-19 declaration under Section 564(b)(1) of the Act, 21 U.S.C. section 360-bbb-3(b)(1), unless the authorization is terminated or revoked sooner.  Performed at [facility], [address] [place], [place] [address]   Lactic acid, plasma     Status: None   Collection Time: 09/07/20  1:13 PM  Result Value Ref Range   Lactic Acid, Venous 1.1 0.5 - 1.9 mmol/L    Comment: Performed at [facility], [address], [place], [place] [address]     Assessment: 55 y.o. female with mild/moderate COVID 19 viral infection diagnosed on 09/07/20  at high risk for progression to severe COVID 19.  Plan:  This patient is a 55 y.o. female that meets the following criteria for Emergency Use Authorization of: Molnupiravir  1. Age >18 yr 2. SARS-COV-2 positive test 3. Symptom onset < 5  days 4. Mild-to-moderate COVID disease with high risk for severe progression to hospitalization or death   I have spoken and communicated the following to the patient or parent/caregiver regarding: 1. Molnupiravir is an unapproved drug that is authorized for use under an Print production planner.  2. There are no adequate, approved, available products for the treatment of COVID-19 in adults who have mild-to-moderate COVID-19 and are at high risk for progressing to severe COVID-19, including hospitalization or death. 3. Other therapeutics are currently authorized. For additional information on all products authorized for treatment or prevention of COVID-19, please see TanEmporium.pl.  4. There are benefits and risks of taking this treatment as outlined in the "Fact Sheet for Patients and Caregivers."  5. "Fact Sheet for Patients and Caregivers" was reviewed with patient. A hard copy will be provided to patient from pharmacy prior to the patient receiving treatment. 6. Patients should continue to self-isolate and use infection control measures (e.g., wear mask, isolate, social distance, avoid sharing personal items, clean and disinfect "high touch" surfaces, and frequent handwashing) according to CDC guidelines.  7. The patient or parent/caregiver has the option to accept or refuse treatment. 8. [place] has established a pregnancy surveillance program. 9. Females of childbearing potential should use a reliable method of contraception correctly and consistently, as applicable, for the duration of treatment and for 4 days after the last dose of Molnupiravir. 17. Males of reproductive potential who are sexually active with females of childbearing potential should use a reliable method of contraception correctly and consistently during treatment and for at least 3 months after the last  dose. 11. Pregnancy status and risk was assessed. Patient verbalized understanding of precautions.   After reviewing above information with the patient, the patient agrees to receive molnupiravir.  Follow up instructions:    . Take prescription BID x 5 days as directed . Reach out to pharmacist for counseling on medication if desired . For concerns regarding further COVID symptoms please follow up with your PCP or urgent care . For urgent or life-threatening issues, seek care at your local emergency department  The patient was provided an opportunity to ask questions, and all were answered. The patient agreed with the plan and demonstrated an understanding of the instructions.   Script sent to [facility] and opted to [employer] via mail order (verified patients address for delivery).  The patient was advised to call their PCP or seek an in-[place] evaluation if the symptoms worsen or if the condition fails to improve as anticipated.   I provided 10 minutes of non face-to-face telephone visit time during this encounter, and > 50% was spent counseling as documented under my assessment & plan.  Jacquelin Hawking, NP 09/08/2020 /12:23 PM

## 2020-09-09 LAB — URINE CULTURE

## 2020-09-13 ENCOUNTER — Inpatient Hospital Stay: Payer: 59 | Admitting: Family Medicine

## 2020-09-15 ENCOUNTER — Other Ambulatory Visit: Payer: Self-pay | Admitting: Physician Assistant

## 2020-09-15 DIAGNOSIS — R4701 Aphasia: Secondary | ICD-10-CM

## 2020-09-15 DIAGNOSIS — E876 Hypokalemia: Secondary | ICD-10-CM

## 2020-10-03 ENCOUNTER — Encounter: Payer: Self-pay | Admitting: Family Medicine

## 2020-10-03 ENCOUNTER — Other Ambulatory Visit: Payer: Self-pay

## 2020-10-03 ENCOUNTER — Ambulatory Visit: Payer: 59 | Admitting: Family Medicine

## 2020-10-03 VITALS — BP 148/91 | HR 97 | Temp 97.6°F | Ht 67.0 in | Wt 206.2 lb

## 2020-10-03 DIAGNOSIS — I482 Chronic atrial fibrillation, unspecified: Secondary | ICD-10-CM

## 2020-10-03 DIAGNOSIS — I720 Aneurysm of carotid artery: Secondary | ICD-10-CM | POA: Diagnosis not present

## 2020-10-03 DIAGNOSIS — I712 Thoracic aortic aneurysm, without rupture, unspecified: Secondary | ICD-10-CM

## 2020-10-03 DIAGNOSIS — Z794 Long term (current) use of insulin: Secondary | ICD-10-CM

## 2020-10-03 DIAGNOSIS — E1143 Type 2 diabetes mellitus with diabetic autonomic (poly)neuropathy: Secondary | ICD-10-CM | POA: Diagnosis not present

## 2020-10-03 DIAGNOSIS — I10 Essential (primary) hypertension: Secondary | ICD-10-CM | POA: Diagnosis not present

## 2020-10-03 NOTE — Assessment & Plan Note (Signed)
Will place referral to vascular surgery.  Found on CTA in December 2021.

## 2020-10-03 NOTE — Assessment & Plan Note (Signed)
At goal today.  Managed by cardiology.  She is on carvedilol 25 mg twice daily, hydralazine 25 mg 3 times daily, Imdur 60 mg daily.

## 2020-10-03 NOTE — Patient Instructions (Signed)
It was very nice to see you today!  I am glad that you are doing better.  I will place a referral for you to see a vascular surgeon to discuss your aneurysms.  I will see back in a few weeks. Please come back to see me sooner if needed.  Take care, Dr Jerline Pain  Please try these tips to maintain a healthy lifestyle:   Eat at least 3 REAL meals and 1-2 snacks per day.  Aim for no more than 5 hours between eating.  If you eat breakfast, please do so within one hour of getting up.    Each meal should contain half fruits/vegetables, one quarter protein, and one quarter carbs (no bigger than a computer mouse)   Cut down on sweet beverages. This includes juice, soda, and sweet tea.     Drink at least 1 glass of water with each meal and aim for at least 8 glasses per day   Exercise at least 150 minutes every week.

## 2020-10-03 NOTE — Assessment & Plan Note (Signed)
Will place referral to vascular surgery.

## 2020-10-03 NOTE — Assessment & Plan Note (Signed)
Managed by endocrinology.  Blood sugars have been at goal.

## 2020-10-03 NOTE — Assessment & Plan Note (Signed)
Rate controlled on carvedilol and anticoagulated on Eliquis.  Managed by cardiology.

## 2020-10-03 NOTE — Progress Notes (Signed)
   Robin [name] is a 55 y.o. female who presents today for an office visit.  Assessment/Plan:  Chronic Problems Addressed Today: Essential hypertension At goal today.  Managed by cardiology.  She is on carvedilol 25 mg twice daily, hydralazine 25 mg 3 times daily, Imdur 60 mg daily.  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   Thoracic aortic aneurysm without rupture [facility]) Will place referral to vascular surgery.  Found on CTA in December 2021.  Type 2 diabetes mellitus with diabetic autonomic neuropathy, with long-term current use of insulin ([place]) Managed by endocrinology.  Blood sugars have been at goal.  Atrial fibrillation, chronic (H[facility]) Rate controlled on carvedilol and anticoagulated on Eliquis.  Managed by cardiology.     Subjective:  HPI:  Patient here for hospital follow-up.  She was seen on 08/14/2020 with changes in mental status.  In the ED was found to be septic secondary to pneumonia.  She was admitted for IV antibiotics.  She did well and was discharged home on hospital day 2 with Omnicef and azithromycin.  She has done well since being home.  No further episodes of cough or fever.  She has today here with her daughter.  They have no pressing concerns however would like to have follow-up on her aneurysms that were apparently detected while in the hospital.  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  She is currently anticoagulated on Eliquis and is following with cardiology for atrial fibrillation management.  She is following with endocrinology for her diabetes.  Home sugars have been reportedly at goal.       Objective:  Physical Exam: BP (!) 148/91   Pulse 97   Temp 97.6 F (36.4 C) (Temporal)   Ht 5\' 7"  (1.702 m)   Wt 206 lb 3.2 oz (93.5 kg)   LMP 08/21/2011   SpO2 100%   BMI 32.30 kg/m   Gen: No acute distress, resting comfortably CV:  Regular rate with no murmurs appreciated Pulm: Normal work of breathing, clear to auscultation bilaterally with no crackles, wheezes, or rhonchi Neuro: Dysarthric speech. Psych: Normal affect and thought content  Time Spent: 45 minutes of total time was spent on the date of the encounter performing the following actions: chart review prior to seeing the patient including recent hospitalization, obtaining history, performing a medically necessary exam, counseling on the treatment plan, placing orders, and documenting in our EHR.        Algis Greenhouse. Jerline Pain, MD 10/03/2020 11:52 AM

## 2020-10-16 LAB — BASIC METABOLIC PANEL
BUN: 27 — AB (ref 4–21)
CO2: 21 (ref 13–22)
Chloride: 96 — AB (ref 99–108)
Creatinine: 2 — AB (ref 0.5–1.1)
Glucose: 399
Potassium: 4.2 (ref 3.4–5.3)
Sodium: 133 — AB (ref 137–147)

## 2020-10-16 LAB — COMPREHENSIVE METABOLIC PANEL
Albumin: 4.2 (ref 3.5–5.0)
Calcium: 9.7 (ref 8.7–10.7)

## 2020-10-16 LAB — CBC AND DIFFERENTIAL: Hemoglobin: 12 (ref 12.0–16.0)

## 2020-10-24 ENCOUNTER — Encounter: Payer: 59 | Admitting: Family Medicine

## 2020-10-24 DIAGNOSIS — Z0289 Encounter for other administrative examinations: Secondary | ICD-10-CM

## 2020-10-25 ENCOUNTER — Encounter: Payer: Self-pay | Admitting: Physician Assistant

## 2020-10-31 ENCOUNTER — Encounter: Payer: Self-pay | Admitting: Physician Assistant

## 2020-11-13 ENCOUNTER — Emergency Department (HOSPITAL_COMMUNITY)
Admission: EM | Admit: 2020-11-13 | Discharge: 2020-11-13 | Disposition: A | Discharge status: Home / Self Care | Payer: 59 | Attending: Emergency Medicine | Admitting: Emergency Medicine

## 2020-11-13 ENCOUNTER — Emergency Department (HOSPITAL_COMMUNITY): Payer: 59

## 2020-11-13 ENCOUNTER — Other Ambulatory Visit: Payer: Self-pay

## 2020-11-13 ENCOUNTER — Encounter (HOSPITAL_COMMUNITY): Payer: Self-pay

## 2020-11-13 DIAGNOSIS — M545 Low back pain, unspecified: Secondary | ICD-10-CM

## 2020-11-13 DIAGNOSIS — R1084 Generalized abdominal pain: Secondary | ICD-10-CM

## 2020-11-13 DIAGNOSIS — R479 Unspecified speech disturbances: Secondary | ICD-10-CM | POA: Insufficient documentation

## 2020-11-13 DIAGNOSIS — Z20822 Contact with and (suspected) exposure to covid-19: Secondary | ICD-10-CM | POA: Insufficient documentation

## 2020-11-13 DIAGNOSIS — R4701 Aphasia: Secondary | ICD-10-CM | POA: Diagnosis not present

## 2020-11-13 DIAGNOSIS — E119 Type 2 diabetes mellitus without complications: Secondary | ICD-10-CM | POA: Insufficient documentation

## 2020-11-13 DIAGNOSIS — Z87891 Personal history of nicotine dependence: Secondary | ICD-10-CM | POA: Insufficient documentation

## 2020-11-13 DIAGNOSIS — R110 Nausea: Secondary | ICD-10-CM | POA: Diagnosis not present

## 2020-11-13 DIAGNOSIS — I4891 Unspecified atrial fibrillation: Secondary | ICD-10-CM | POA: Diagnosis not present

## 2020-11-13 DIAGNOSIS — Z8679 Personal history of other diseases of the circulatory system: Secondary | ICD-10-CM | POA: Insufficient documentation

## 2020-11-13 DIAGNOSIS — M5459 Other low back pain: Secondary | ICD-10-CM | POA: Diagnosis not present

## 2020-11-13 HISTORY — DX: Unspecified atrial fibrillation: I48.91

## 2020-11-13 HISTORY — DX: Type 2 diabetes mellitus without complications: E11.9

## 2020-11-13 HISTORY — DX: Abdominal aortic aneurysm, without rupture: I71.4

## 2020-11-13 HISTORY — DX: Cerebral infarction, unspecified: I63.9

## 2020-11-13 HISTORY — DX: Abdominal aortic aneurysm, without rupture, unspecified: I71.40

## 2020-11-13 LAB — DIFFERENTIAL
Abs Immature Granulocytes: 0.01 10*3/uL (ref 0.00–0.07)
Basophils Absolute: 0.1 10*3/uL (ref 0.0–0.1)
Basophils Relative: 1 %
Eosinophils Absolute: 0.2 10*3/uL (ref 0.0–0.5)
Eosinophils Relative: 4 %
Immature Granulocytes: 0 %
Lymphocytes Relative: 40 %
Lymphs Abs: 2.2 10*3/uL (ref 0.7–4.0)
Monocytes Absolute: 0.4 10*3/uL (ref 0.1–1.0)
Monocytes Relative: 7 %
Neutro Abs: 2.7 10*3/uL (ref 1.7–7.7)
Neutrophils Relative %: 48 %

## 2020-11-13 LAB — CBC
HCT: 36.8 % (ref 36.0–46.0)
Hemoglobin: 11.4 g/dL — ABNORMAL LOW (ref 12.0–15.0)
MCH: 23.7 pg — ABNORMAL LOW (ref 26.0–34.0)
MCHC: 31 g/dL (ref 30.0–36.0)
MCV: 76.5 fL — ABNORMAL LOW (ref 80.0–100.0)
Platelets: 220 10*3/uL (ref 150–400)
RBC: 4.81 MIL/uL (ref 3.87–5.11)
RDW: 14.8 % (ref 11.5–15.5)
WBC: 5.5 10*3/uL (ref 4.0–10.5)
nRBC: 0 % (ref 0.0–0.2)

## 2020-11-13 LAB — RAPID URINE DRUG SCREEN, HOSP PERFORMED
Amphetamines: NOT DETECTED
Barbiturates: NOT DETECTED
Benzodiazepines: NOT DETECTED
Cocaine: NOT DETECTED
Opiates: NOT DETECTED
Tetrahydrocannabinol: NOT DETECTED

## 2020-11-13 LAB — COMPREHENSIVE METABOLIC PANEL
ALT: 10 U/L (ref 0–44)
AST: 15 U/L (ref 15–41)
Albumin: 3 g/dL — ABNORMAL LOW (ref 3.5–5.0)
Alkaline Phosphatase: 95 U/L (ref 38–126)
Anion gap: 11 (ref 5–15)
BUN: 30 mg/dL — ABNORMAL HIGH (ref 6–20)
CO2: 21 mmol/L — ABNORMAL LOW (ref 22–32)
Calcium: 8.6 mg/dL — ABNORMAL LOW (ref 8.9–10.3)
Chloride: 100 mmol/L (ref 98–111)
Creatinine, Ser: 1.87 mg/dL — ABNORMAL HIGH (ref 0.44–1.00)
GFR, Estimated: 32 mL/min — ABNORMAL LOW (ref >60)
Glucose, Bld: 414 mg/dL — ABNORMAL HIGH (ref 70–99)
Potassium: 4 mmol/L (ref 3.5–5.1)
Sodium: 132 mmol/L — ABNORMAL LOW (ref 135–145)
Total Bilirubin: 0.5 mg/dL (ref 0.3–1.2)
Total Protein: 6.2 g/dL — ABNORMAL LOW (ref 6.5–8.1)

## 2020-11-13 LAB — PROTIME-INR
INR: 1.5 — ABNORMAL HIGH (ref 0.8–1.2)
Prothrombin Time: 17.7 seconds — ABNORMAL HIGH (ref 11.4–15.2)

## 2020-11-13 LAB — URINALYSIS, ROUTINE W REFLEX MICROSCOPIC
Bilirubin Urine: NEGATIVE
Glucose, UA: >=500 mg/dL — AB
Hgb urine dipstick: NEGATIVE
Ketones, ur: NEGATIVE mg/dL
Nitrite: NEGATIVE
Protein, ur: NEGATIVE mg/dL
Specific Gravity, Urine: 1.015 (ref 1.005–1.030)
pH: 5 (ref 5.0–8.0)

## 2020-11-13 LAB — I-STAT CHEM 8, ED
BUN: 37 mg/dL — ABNORMAL HIGH (ref 6–20)
Calcium, Ion: 1.11 mmol/L — ABNORMAL LOW (ref 1.15–1.40)
Chloride: 100 mmol/L (ref 98–111)
Creatinine, Ser: 1.8 mg/dL — ABNORMAL HIGH (ref 0.44–1.00)
Glucose, Bld: 424 mg/dL — ABNORMAL HIGH (ref 70–99)
HCT: 36 % (ref 36.0–46.0)
Hemoglobin: 12.2 g/dL (ref 12.0–15.0)
Potassium: 4.2 mmol/L (ref 3.5–5.1)
Sodium: 133 mmol/L — ABNORMAL LOW (ref 135–145)
TCO2: 25 mmol/L (ref 22–32)

## 2020-11-13 LAB — APTT: aPTT: 27 seconds (ref 24–36)

## 2020-11-13 LAB — CBG MONITORING, ED
Glucose-Capillary: 360 mg/dL — ABNORMAL HIGH (ref 70–99)
Glucose-Capillary: 339 mg/dL — ABNORMAL HIGH (ref 70–99)

## 2020-11-13 LAB — I-STAT BETA HCG BLOOD, ED (MC, WL, AP ONLY): I-stat hCG, quantitative: <5 m[IU]/mL (ref <5)

## 2020-11-13 LAB — RESP PANEL BY RT-PCR (FLU A&B, COVID) ARPGX2
Influenza A by PCR: NEGATIVE
Influenza B by PCR: NEGATIVE
SARS Coronavirus 2 by RT PCR: NEGATIVE

## 2020-11-13 MED ORDER — FENTANYL CITRATE (PF) 100 MCG/2ML IJ SOLN
50.0000 ug | Freq: Once | INTRAMUSCULAR | Status: AC
  Administered 2020-11-13: 50 ug via INTRAVENOUS
  Filled 2020-11-13: qty 2

## 2020-11-13 MED ORDER — LIDOCAINE 5 % EX PTCH: 1.0000 | MEDICATED_PATCH | CUTANEOUS | 0 refills | Status: DC

## 2020-11-13 MED ORDER — IOHEXOL 350 MG/ML SOLN
75.0000 mL | Freq: Once | INTRAVENOUS | Status: AC | PRN
  Administered 2020-11-13: 75 mL via INTRAVENOUS

## 2020-11-13 NOTE — ED Notes (Signed)
I notified pt's sister pt was being D/C'd and she called daughter and they wanted pt D/C'd to daughter's home. Pt was D/C'd to daughter's home with PTAR.

## 2020-11-13 NOTE — ED Notes (Signed)
Pt given [place] sandwich bag and water per RN order.

## 2020-11-13 NOTE — ED Notes (Signed)
Ptar called by [place] [facility] stated it will be a while they are very busy

## 2020-11-13 NOTE — ED Notes (Signed)
Updated pt's sister on condition

## 2020-11-13 NOTE — ED Notes (Signed)
Patient transported to MRI 

## 2020-11-13 NOTE — Consult Note (Addendum)
Neurology consult   CC: Code stroke  History is obtained from: Chart, sister over telephone  HPI: Ms Ficken is a 55 yo female who appears older than her stated age with a PMHx of a left MCA territory infarct with thrombectomy 01/2020, AAA, IDDM II, COPD, GERD, CHF, CKD III, AF on chronic AC, nicotine abuse, and cocaine abuse with a + UDS on a past hospitalization. Patient presented to ED today via EMS after calling for tearing abdominal pain. She exhibited aphasia on exam, so code stroke called. She was at her boyfriend's house when this occurred.   Patient has residual effects from Left MCA stroke in 01/2020 of dysarthria, aphasia, and right sided weakness, per chart and per sister over telephone.   Patient was brought urgently to CT suite for [facility]. She was confused on exam right UE and LE weakness, decreased sensation on right side with expressive aphasia and dysarthria on exam. CTH showed no acute infarction and confirmed old left MCA stroke with encephalomalacia. ASPECTS 10. Her symptoms improved in CT suite.   No head/neck CTA was performed. A CTA dissection protocol was ordered by ED MD. UDS negative.   In review of chart, last visit with PCP was 10/03/20 where no changes were made. Patient was referred to Vascular for f/up AAA and carotid aneurysm in 07/2020. 05/2020 was hospitalized for encephalopathy due to UTI/AKI. 07/2020 for acute on chronic CHF decompensation. 08/16/20 for sepsis and PNA.   LKW: 0830 hours  tpa given?: No, patient is on Eliquis and states her last dose was this a.m.   IR Thrombectomy? No, not a candidate due to previous stroke.  MRS 3  NIHSS:  1a Level of Conscious: 0 1b LOC Questions: 2 1c LOC Commands: 0 2 Best Gaze: 0 3 Visual: 2 4 Facial Palsy:0  5a Motor Arm - left: 0 5b Motor Arm - Right: 1 6a Motor Leg - Left: 0 6b Motor Leg - Right: 1 7 Limb Ataxia: 0 8 Sensory: 1 9 Best Language: 2 10 Dysarthria: 1 11 Extinct. and Inatten: 0 TOTAL:  10  ROS: A  robust ROS was performed and is negative except as noted in the HPI.   Past Medical History:  Diagnosis Date  . Abdominal aortic aneurysm (AAA) ([place])   . Atrial fibrillation ([place])   . Diabetes mellitus without complication ([place])   . Stroke [facility])   IDDM II, CKD III, Left MCA infarct with residual right sided weakness and speaking issues, CHF, COPD  FMHx: alcoholism in mother and father, DM in father.   Social History:  reports that she has quit smoking. Her smoking use included cigarettes. She does not have any smokeless tobacco history on file. She reports that she does not drink alcohol and does not use drugs.  + cocaine on UDS in the past  Medications PTA on chart from last hospitalization: Crestor, Eliquis 5mg  po bid, Coreg, Lasix, Neurontin, Hydalazine, Imdur, Protonix, Celexa       Exam: Current vital signs: BP (!) 127/96   Pulse 93   Temp 97.7 F (36.5 C) (Oral)   Resp 14   Ht 5\' 5"  (1.651 m)   Wt 113.4 kg   SpO2 97%   BMI 41.60 kg/m   Physical Exam  Constitutional: Appears fairly well, obese Psych: Affect appropriate to situation Eyes: No scleral injection HENT: No OP obstrucion Head: Normocephalic.  Cardiovascular: Normal rate and regular rhythm.  Respiratory: Effort normal and breath sounds normal to anterior ascultation GI: tenderness on  palpation Skin: WDI  Neuro: Mental Status: Patient is awake, alert, oriented to [place].  Patient is unable to give a clear and coherent history. + expressive aphasia Speech/Language: speech is dysarthric and not fluent in beginning, but later, fluent.  Cranial Nerves: II: right hemianopsia. Right neglect. No blink to threat on the right. Pupils are equal, round, and reactive to light.  III,IV, VI: EOMI without ptosis or diploplia.  V: Facial sensation is symmetric to light touch VII: Facial movement is symmetric.  VIII: hearing is intact to voice X: Uvula elevates symmetrically XI: Shoulder shrug is symmetric. XII:  tongue is midline without atrophy or fasciculations.  Motor: Tone is normal. Bulk is normal. ????strength Sensory: Sensation is decreased to the right side.  Plantars: Toes are downgoing bilaterally.   I have reviewed labs in epic and the pertinent results are: INR  1.5   Glucose 424     MD reviewed the images obtained:  NCT head  1. No acute cortically based infarct or acute intracranial hemorrhage identified. 2. Chronic left MCA encephalomalacia.  ASPECTS 10.  MRI brain pending  Assessment: 55 yo female with PMHx of left MCA stroke in 01/2020 with residual speech abnormality and right sided weakness. Today, while at boyfriend's house, she began to c/o severe, tearing abdominal pain radiating to the back. Because of aphasia noted en route via EMS, code stroke was called. Patient has multiple risk factors for stroke, including, old stroke, IDDM II, atherosclerosis, HLD, HTN, AF, and tobacco abuse. CTH was negative for acute stroke. Symptoms are probably just exacerbated residual symptoms due to pain and anxiety. MRI to follow. UDS negative.   Impression: Aphasia accompanying severe abdominal pain, likely exacerbation of symptoms due to pain/anxiety. Will get MRI brain.   Plan: - MRI brain without contrast.  Electronically signed by: Clance Boll, MSN, APN-BC, nurse practitioner and by MD. Note/plan to be edited by MD as needed.  Pager: [phone]   I have seen the patient and reviewed the above note. I suspect that she had more prominance of her underlying symptoms in the setting of physiological stressor (abdominal pain).  If MRI is negative, no further recommendations at this time.  Roland Rack, MD [facility] [phone]  If 7pm- 7am, please page neurology on call as listed in [place].

## 2020-11-13 NOTE — Discharge Instructions (Addendum)
Follow-up with your primary care doctor.  Return here as needed for any worsening symptoms. 

## 2020-11-13 NOTE — ED Notes (Signed)
Pt unable to sign MSE, because she is AMS and no family is present.

## 2020-11-13 NOTE — ED Provider Notes (Signed)
[facility] EMERGENCY DEPARTMENT Provider Note   CSN: [id number] Arrival date & time: 11/13/20  2637  An emergency department physician performed an initial assessment on this suspected stroke patient at [place].  History Chief Complaint  Patient presents with  . Abdominal Pain    Robin [name] is a 55 y.o. female.  Patient is a 55 year old female who presents with abdominal pain.  She has a history of a thoracic aneurysm, hypertension, atrial fibrillation on Eliquis and CHF.  She is also had a prior CVA with right-sided weakness.  Per EMS, she was brought from home with worsening abdominal pain.  She states her abdomen has been hurting since yesterday.  Is progressively gotten worse.  It goes to her back as well.  She reportedly has had some nausea as well.  Per EMS when she was in route to the hospital, she started having difficulty with her speech with slurring of her speech and having trouble getting her words out.  They indicated this was a change from her baseline when they first encountered her.  They were unable to get much history from the family or medication list.        Past Medical History:  Diagnosis Date  . Abdominal aortic aneurysm (AAA) ([place])   . Atrial fibrillation ([place])   . Diabetes mellitus without complication ([place])   . Stroke [facility])     There are no problems to display for this patient.   History reviewed. No pertinent surgical history.   OB History   No obstetric history on file.     No family history on file.  Social History   Tobacco Use  . Smoking status: Former Smoker    Types: Cigarettes  Substance Use Topics  . Alcohol use: Never  . Drug use: Never    Home Medications Prior to Admission medications   Medication Sig Start Date End Date Taking? Authorizing Provider  lidocaine (LIDODERM) 5 % Place 1 patch onto the skin daily. Remove & Discard patch within 12 hours or as directed by MD 11/13/20  Yes Malvin Johns, MD     Allergies    Patient has no known allergies.  Review of Systems   Review of Systems  Constitutional: Negative for chills, diaphoresis, fatigue and fever.  HENT: Negative for congestion, rhinorrhea and sneezing.   Eyes: Negative.   Respiratory: Negative for cough, chest tightness and shortness of breath.   Cardiovascular: Negative for chest pain and leg swelling.  Gastrointestinal: Positive for abdominal pain and nausea. Negative for blood in stool, diarrhea and vomiting.  Genitourinary: Negative for difficulty urinating, flank pain, frequency and hematuria.  Musculoskeletal: Negative for arthralgias and back pain.  Skin: Negative for rash.  Neurological: Positive for speech difficulty. Negative for dizziness, weakness, numbness and headaches.    Physical Exam Updated Vital Signs BP (!) 133/99   Pulse 91   Temp 97.7 F (36.5 C) (Oral)   Resp (!) 23   Ht 5\' 5"  (1.651 m)   Wt 113.4 kg   SpO2 99%   BMI 41.60 kg/m   Physical Exam Constitutional:      Appearance: She is well-developed.  HENT:     Head: Normocephalic and atraumatic.  Eyes:     Pupils: Pupils are equal, round, and reactive to light.  Cardiovascular:     Rate and Rhythm: Normal rate and regular rhythm.     Heart sounds: Normal heart sounds.  Pulmonary:     Effort: Pulmonary effort is normal.  No respiratory distress.     Breath sounds: Normal breath sounds. No wheezing or rales.  Chest:     Chest wall: No tenderness.  Abdominal:     General: Bowel sounds are normal.     Palpations: Abdomen is soft.     Tenderness: There is no abdominal tenderness (Tenderness across the lower abdomen bilaterally). There is no guarding or rebound.  Musculoskeletal:        General: Normal range of motion.     Cervical back: Normal range of motion and neck supple.  Lymphadenopathy:     Cervical: No cervical adenopathy.  Skin:    General: Skin is warm and dry.     Findings: No rash.  Neurological:     General: No  focal deficit present.     Mental Status: She is alert.     Comments: Patient has some slow speech and has a hard time getting her words out although she is identifying objects without difficulty.  She is oriented to [place].  She had difficulty telling me the year and could not tell me the place.  She does have some right side weakness in her arm and her leg as compared to the left with some minor contractures of the right arm.  It seems that this weakness is at baseline     ED Results / Procedures / Treatments   Labs (all labs ordered are listed, but only abnormal results are displayed) Labs Reviewed  PROTIME-INR - Abnormal; Notable for the following components:      Result Value   Prothrombin Time 17.7 (*)    INR 1.5 (*)    All other components within normal limits  CBC - Abnormal; Notable for the following components:   Hemoglobin 11.4 (*)    MCV 76.5 (*)    MCH 23.7 (*)    All other components within normal limits  COMPREHENSIVE METABOLIC PANEL - Abnormal; Notable for the following components:   Sodium 132 (*)    CO2 21 (*)    Glucose, Bld 414 (*)    BUN 30 (*)    Creatinine, Ser 1.87 (*)    Calcium 8.6 (*)    Total Protein 6.2 (*)    Albumin 3.0 (*)    GFR, Estimated 32 (*)    All other components within normal limits  URINALYSIS, ROUTINE W REFLEX MICROSCOPIC - Abnormal; Notable for the following components:   Glucose, UA >=500 (*)    Leukocytes,Ua MODERATE (*)    Bacteria, UA RARE (*)    All other components within normal limits  CBG MONITORING, ED - Abnormal; Notable for the following components:   Glucose-Capillary 360 (*)    All other components within normal limits  I-STAT CHEM 8, ED - Abnormal; Notable for the following components:   Sodium 133 (*)    BUN 37 (*)    Creatinine, Ser 1.80 (*)    Glucose, Bld 424 (*)    Calcium, Ion 1.11 (*)    All other components within normal limits  RESP PANEL BY RT-PCR (FLU A&B, COVID) ARPGX2  URINE CULTURE  APTT   DIFFERENTIAL  RAPID URINE DRUG SCREEN, HOSP PERFORMED  ETHANOL  LIPASE, BLOOD  I-STAT BETA HCG BLOOD, ED ([facility], [facility], [facility] ONLY)  CBG MONITORING, ED    EKG EKG Interpretation  Date/Time:  Monday November 13 2020 08:41:35 EDT Ventricular Rate:  95 PR Interval:    QRS Duration: 95 QT Interval:  408 QTC Calculation: 456 R Axis:  92 Text Interpretation: Atrial fibrillation Anterior infarct, old No old tracing to compare Confirmed by Malvin Johns [phone]) on 11/13/2020 8:46:08 AM Also confirmed by Malvin Johns [phone]), editor Hattie Perch ([id number])  on 11/13/2020 10:30:39 AM   Radiology MR BRAIN WO CONTRAST  Result Date: 11/13/2020 CLINICAL DATA:  Neuro deficit, acute, stroke suspected. Additional history obtained from [place] finding difficulty. EXAM: MRI HEAD WITHOUT CONTRAST TECHNIQUE: Multiplanar, multiecho pulse sequences of the brain and surrounding structures were obtained without intravenous contrast. COMPARISON:  Noncontrast head CT 11/13/2020.  Brain MRI 05/12/2020. FINDINGS: Brain: Redemonstrated large chronic left MCA territory infarct. The infarct involves the cortical and subcortical left frontal and temporal lobes, as well as small portions of the left parietal lobe. The left insula and left basal ganglia are also involved. Redemonstrated associated cortical laminar necrosis and chronic blood products within the infarction territory. Redemonstrated small chronic infarct within the posterior right temporal lobe with a small amount of chronic blood products at this site (for instance as seen on series 7, image 55). Background cerebral volume is normal. There is no acute infarct. No evidence of intracranial mass. No extra-axial fluid collection. No midline shift. Vascular: Expected proximal arterial flow voids. Skull and upper cervical spine: No focal marrow lesion Sinuses/Orbits: Visualized orbits show no acute finding. Mild bilateral ethmoid sinus mucosal  thickening. IMPRESSION: No evidence of acute intracranial abnormality, including acute infarction. Redemonstrated large chronic left MCA territory infarct. Redemonstrated small chronic cortically based infarct within the posterior right temporal lobe. Mild bilateral ethmoid sinus mucosal thickening. Electronically Signed   By: Kellie Simmering DO   On: 11/13/2020 14:54   CT Angio Chest/Abd/Pel for Dissection W and/or W/WO  Result Date: 11/13/2020 CLINICAL DATA:  Tearing abdominal pain. EXAM: CT ANGIOGRAPHY CHEST, ABDOMEN AND PELVIS TECHNIQUE: Non-contrast CT of the chest was initially obtained. Multidetector CT imaging through the chest, abdomen and pelvis was performed using the standard protocol during bolus administration of intravenous contrast. Multiplanar reconstructed images and MIPs were obtained and reviewed to evaluate the vascular anatomy. CONTRAST:  59mL OMNIPAQUE IOHEXOL 350 MG/ML SOLN COMPARISON:  Prior CTA of the chest with CT of the abdomen and pelvis on 08/14/2020 at [place] Cardiovascular: The thoracic aorta shows stable and normal caliber without evidence of dissection or atherosclerosis. Visualized proximal great vessels demonstrate bovine branching anatomy and normal patency with mild calcified plaque at the left subclavian artery origin. Stable cardiac enlargement. No pericardial fluid. Central pulmonary arteries are normal in caliber. Pulmonary arteries are adequately opacified to exclude significant pulmonary embolism. No visualized calcified coronary artery plaque. Mediastinum/Nodes: No enlarged mediastinal, hilar, or axillary lymph nodes. Thyroid gland, trachea, and esophagus demonstrate no significant findings. Lungs/Pleura: Mild ill-defined areas of ground-glass opacity in both lungs are likely on the basis of atelectasis as well as some motion artifact. However, given the previous depiction of bilateral pneumonia, there may be some residual mild  opacity remaining related to prior pneumonia which may have been related to prior COVID-19 infection. No pulmonary edema, pleural fluid or pneumothorax is identified. Musculoskeletal: No chest wall abnormality. No acute or significant osseous findings. Review of the MIP images confirms the above findings. CTA ABDOMEN AND PELVIS FINDINGS VASCULAR Aorta: The abdominal aorta shows mild atherosclerosis with no evidence of aneurysmal disease, dissection or vasculitis. Celiac: Noncalcified plaque in the proximal celiac trunk causes approximately 50% stenosis. SMA: Normally patent. Renals: Mild atherosclerosis of bilateral single renal arteries without significant stenosis. IMA: Normally patent.  Inflow: Calcified plaque in bilateral common iliac arteries without significant stenosis or aneurysmal disease. Bilateral internal and external iliac arteries demonstrate normal patency. Bilateral common femoral arteries and femoral bifurcations are normally patent. Review of the MIP images confirms the above findings. NON-VASCULAR Hepatobiliary: No focal liver abnormality is seen. Status post cholecystectomy. No biliary dilatation. Pancreas: Unremarkable. No pancreatic ductal dilatation or surrounding inflammatory changes. Spleen: Normal in size without focal abnormality. Adrenals/Urinary Tract: Adrenal glands are unremarkable. Kidneys are normal, without renal calculi, focal lesion, or hydronephrosis. Bladder is unremarkable. Stomach/Bowel: Bowel shows no evidence of obstruction, ileus, inflammatory process or lesion. The appendix is visualized and appears normal. No free intraperitoneal air identified. Lymphatic: No enlarged lymph nodes identified in the abdomen or pelvis. Reproductive: Status post hysterectomy. No adnexal masses. Other: No abdominal wall hernia or abnormality. No abdominopelvic ascites. No evidence of focal abscess. Musculoskeletal: No acute or significant osseous findings. Review of the MIP images confirms the  above findings. IMPRESSION: 1. No evidence of aortic dissection or aneurysmal disease. 2. No evidence of pulmonary embolism. 3. Mild ill-defined areas of ground-glass opacity in both lungs are likely on the basis of atelectasis as well as some motion artifact. However, given the previous depiction of bilateral pneumonia, there may be some residual mild opacity remaining related to prior pneumonia, potentially related to prior COVID-19 infection. 4. Stable cardiac enlargement. 5. No acute findings in the abdomen or pelvis. Aortic Atherosclerosis (ICD10-I70.0). Electronically Signed   By: Aletta Edouard M.D.   On: 11/13/2020 10:25   CT HEAD CODE STROKE WO CONTRAST  Result Date: 11/13/2020 CLINICAL DATA:  Code stroke.  55 year old female EXAM: CT HEAD WITHOUT CONTRAST TECHNIQUE: Contiguous axial images were obtained from the base of the skull through the vertex without intravenous contrast. COMPARISON:  Head CT 09/07/2020 and earlier.  Brain MRI 05/12/2020. FINDINGS: Brain: Chronic encephalomalacia in the anterior left MCA territory extending from the left basal ganglia laterally. Mild ex vacuo enlargement of the left lateral ventricle. Stable gray-white matter differentiation elsewhere. No midline shift, ventriculomegaly, mass effect, evidence of mass lesion, intracranial hemorrhage or evidence of cortically based acute infarction. Vascular: No suspicious intracranial vascular hyperdensity. Skull: No acute osseous abnormality identified. Sinuses/Orbits: Retained secretions in the nasopharynx today. Visualized paranasal sinuses and mastoids are stable and well aerated. Other: Visualized orbits and scalp soft tissues are within normal limits. ASPECTS [facility] Stroke Program Early CT Score) Total score (0-10 with 10 being normal): 10 (chronic left MCA encephalomalacia). IMPRESSION: 1. No acute cortically based infarct or acute intracranial hemorrhage identified. 2. Chronic left MCA encephalomalacia.  ASPECTS 10. 3.  These results were communicated to Dr. Leonel Ramsay at 9:06 amon 4/11/2022by text page via the [facility] messaging system. Electronically Signed   By: Genevie Ann M.D.   On: 11/13/2020 09:06    Procedures Procedures   Medications Ordered in ED Medications  iohexol (OMNIPAQUE) 350 MG/ML injection 75 mL (75 mLs Intravenous Contrast Given 11/13/20 0937)  fentaNYL (SUBLIMAZE) injection 50 mcg (50 mcg Intravenous Given 11/13/20 1235)    ED Course  I have reviewed the triage vital signs and the nursing notes.  Pertinent labs & imaging results that were available during my care of the patient were reviewed by me and considered in my medical decision making (see chart for details).    MDM Rules/Calculators/A&P                          Patient is a 55 year old female who  presents with initially abdominal pain.  She reported to have a history of thoracic aneurysm.  She had a CTA of her chest abdomen pelvis which showed no acute abnormalities.  No evidence of pancreatitis.  No evidence of gallbladder disease.  There was some question of some infiltrates versus resolving infection.  She does not have any acute symptoms of pneumonia.  Her labs are nonconcerning.  Her creatinine is similar to baseline values.  Her glucose is elevated but there is no suggestions of DKA.  She was given IV fluids.  When she initially came in she had reported by EMS to have a change in her speech in route.  For this reason a code stroke was initiated.  She has some right-sided weakness which is likely chronic but it was difficult to ascertain.  Dr. Leonel Ramsay is evaluate the patient and it was felt that you are likely chronic changes.  MRI was performed which showed no acute abnormality.  Patient started complaining of some pain across her lower back.  It seems to be muscular nature.  She has no neurologic deficits other than the ones described above which are chronic.  Was given some pain medicine.  She was discharged home in good  condition.  Will prescribe Lidoderm patches and encouraged her to follow-up with her PCP.  Return precautions were given. Final Clinical Impression(s) / ED Diagnoses Final diagnoses:  Generalized abdominal pain  Bilateral low back pain without sciatica, unspecified chronicity    Rx / DC Orders ED Discharge Orders         Ordered    lidocaine (LIDODERM) 5 %  Every 24 hours        11/13/20 1508           Malvin Johns, MD 11/13/20 1531

## 2020-11-13 NOTE — ED Notes (Signed)
Patient verbalizes understanding of discharge instructions. Opportunity for questioning and answers were provided. Armband removed by staff, pt discharged from ED via wheelchair.  

## 2020-11-13 NOTE — Code Documentation (Signed)
Stroke Response Nurse Documentation Code Documentation  Robin [name] is a 55 y.o. female arriving to [facility] ED via [facility] on 11/13/2020 with past medical hx of CVA, AF, TAA, HTN and CHF. Code stroke was activated by ED. Patient from home where she was LKW at [phone] and now complaining of abdominal and back pain and difficulty finding words.  Per Sister she has had aphasia since her previous stroke.  Today her speech is worse than normal.  On Eliquis (apixaban) daily. Stroke team at the bedside on patient arrival. Labs drawn and patient cleared for CT by Dr. Tamera Punt. Patient to CT with team. NIHSS 10 , see documentation for details and code stroke times. Patient with disoriented, right hemianopia, right arm weakness, right leg weakness, right decreased sensation, Expressive aphasia  and dysarthria  on exam. The following imaging was completed:  CT, CTA head and neck. Patient is not a candidate for tPA due to Eliquis Care/Plan. Bedside handoff with ED RN Alana. Neuro checks and VS q 2 hours    Raliegh Ip  Stroke Response RN

## 2020-11-13 NOTE — ED Triage Notes (Signed)
Pt arrived via [facility] for tearing abdominal pain that radiates into backx2 days. Per EMS pt initial GCS was 15, but enroute pt became AMS, aphasic and slurred speech. Pt has right sided deficits from prior stroke. Pt is A-fib on the monitor. VSS. Code stroke called on pt.

## 2020-11-14 ENCOUNTER — Encounter: Payer: Self-pay | Admitting: Family Medicine

## 2020-11-14 ENCOUNTER — Ambulatory Visit: Payer: 59 | Admitting: Student

## 2020-11-14 LAB — URINE CULTURE: Culture: NO GROWTH

## 2020-11-15 ENCOUNTER — Telehealth: Payer: Self-pay

## 2020-11-15 NOTE — Telephone Encounter (Signed)
Applying for medicaid and need completed FL2 forms from dr. Please advise

## 2020-11-20 NOTE — Telephone Encounter (Signed)
Left voice message for patient to call clinic.  

## 2020-11-21 NOTE — Telephone Encounter (Signed)
Left voice message for patient to call clinic.  

## 2020-11-24 ENCOUNTER — Other Ambulatory Visit: Payer: Self-pay | Admitting: *Deleted

## 2020-11-24 DIAGNOSIS — I720 Aneurysm of carotid artery: Secondary | ICD-10-CM

## 2020-11-27 ENCOUNTER — Ambulatory Visit (INDEPENDENT_AMBULATORY_CARE_PROVIDER_SITE_OTHER): Payer: 59

## 2020-11-27 ENCOUNTER — Telehealth: Payer: Self-pay

## 2020-11-27 ENCOUNTER — Encounter: Payer: Self-pay | Admitting: Vascular Surgery

## 2020-11-27 ENCOUNTER — Other Ambulatory Visit: Payer: Self-pay

## 2020-11-27 ENCOUNTER — Ambulatory Visit (INDEPENDENT_AMBULATORY_CARE_PROVIDER_SITE_OTHER): Payer: 59 | Admitting: Vascular Surgery

## 2020-11-27 VITALS — BP 138/92 | HR 112 | Temp 97.2°F | Resp 14 | Ht 67.0 in | Wt 213.0 lb

## 2020-11-27 DIAGNOSIS — I720 Aneurysm of carotid artery: Secondary | ICD-10-CM

## 2020-11-27 NOTE — Telephone Encounter (Signed)
Pt sister dropped off level of care request form for Dr. Jerline Pain to complete. Placed form in file.

## 2020-11-27 NOTE — Progress Notes (Signed)
[facility] [place]  Patient name: Robin [name] MRN: [id number] DOB: 01-03-1966 Sex: female  REASON FOR CONSULT: Evaluation possible extracranial carotid disease.  HPI: Robin [name] is a 55 y.o. female, who is here today for evaluation.  She is here with her sister who provides majority of the history.  She had a major stroke in May of last year leaving her with significant right sided weakness and aphasia.  She is left-handed.  Stroke evaluation at that time revealed multiple intracranial aneurysms.  There had been some discussion about potential coiling of these and this has been delayed for scheduling issues.  She also recently underwent CT scan of her chest abdomen and pelvis to rule out aortic dissection and I have this for review as well.  The study was earlier this month.  She does live with her daughter.  Is able to walk without a cane or walker but is slow.  Past Medical History:  Diagnosis Date  . A-fib ([place])   . Abdominal aortic aneurysm (AAA) ([place])   . Allergy   . Anemia   . Arthritis   . Atrial fibrillation ([place])   . CHF (congestive heart failure) ([place])    a. EF at 30-35% by echo in 08/2018 b. EF at 35% by repeat echo in 05/2019  . Chronic abdominal pain   . Chronic atrial fibrillation ([place])   . Cocaine abuse ([place])   . COPD (chronic obstructive pulmonary disease) ([place])   . Diabetes mellitus without complication ([place])   . Essential hypertension, benign   . Expressive aphasia   . Expressive aphasia    post CVA  . Fatty liver   . GERD (gastroesophageal reflux disease)   . Gout 2016  . Normal coronary arteries    3/10 - following abnormal Myoview  . Ovarian cyst   . Stroke ([place]) 12/26/2019   Right sided weakness, and expressive aphasia  . Stroke ([place])   . Thoracic ascending aortic aneurysm (H[facility])    4 cm 10/31/19 CTA  . Type 2 diabetes mellitus ([place])    type II    Family History  Problem Relation Age  of Onset  . Cirrhosis Mother   . Jemel Ono death Mother   . Alcohol abuse Mother   . Diabetes type II Father   . Alcohol abuse Father   . Diabetes type II Sister   . Serge Main death Brother   . Alcohol abuse Son   . Anesthesia problems Neg Hx   . Hypotension Neg Hx   . Malignant hyperthermia Neg Hx   . Pseudochol deficiency Neg Hx   . Colon cancer Neg Hx   . Colon polyps Neg Hx   . Esophageal cancer Neg Hx   . Rectal cancer Neg Hx   . Stomach cancer Neg Hx     SOCIAL HISTORY: Social History   Socioeconomic History  . Marital status: Single    Spouse name: Not on file  . Number of children: Not on file  . Years of education: Not on file  . Highest education level: Not on file  Occupational History  . Not on file  Tobacco Use  . Smoking status: Former Smoker    Years: 29.00    Types: Cigarettes    Quit date: 12/2019    Years since quitting: 0.9  . Smokeless tobacco: Never Used  Vaping Use  . Vaping Use: Never used  Substance and Sexual Activity  . Alcohol use: Never  Comment: occ  . Drug use: Never    Types: Cocaine, Marijuana  . Sexual activity: Not Currently    Birth control/protection: Surgical  Other Topics Concern  . Not on file  Social History Narrative   ** Merged History Encounter **       Part-time at [employer], lives in [place], [place] 3 children  Married   Social Determinants of Health   Financial Resource Strain: Not on file  Food Insecurity: Not on file  Transportation Needs: Not on file  Physical Activity: Not on file  Stress: Not on file  Social Connections: Not on file  Intimate Partner Violence: Not on file    Allergies  Allergen Reactions  . Bee Venom Shortness Of Breath and Swelling    Bodily Swelling  . Losartan Other (See Comments)    Nosebleeds per patient report.   . Naproxen Other (See Comments)    Acid reflux  . Penicillins Nausea Only    Has patient had a PCN reaction causing immediate rash, facial/tongue/throat  swelling, SOB or lightheadedness with hypotension: no Has patient had a PCN reaction causing severe rash involving mucus membranes or skin necrosis: no Has patient had a PCN reaction that required hospitalization no Has patient had a PCN reaction occurring within the last 10 years: no If all of the above answers are "NO", then may proceed with Cephalosporin   . Lisinopril     Sinus congestion    Current Outpatient Medications  Medication Sig Dispense Refill  . acetaminophen (TYLENOL) 325 MG tablet Take 650 mg by mouth every 8 (eight) hours as needed for mild pain.    Marland Kitchen apixaban (ELIQUIS) 5 MG TABS tablet Take 1 tablet (5 mg total) by mouth 2 (two) times daily. 60 tablet 11  . aspirin EC 81 MG tablet Take 1 tablet (81 mg total) by mouth daily. Swallow whole. 90 tablet 3  . BD PEN NEEDLE NANO U/F 32G X 4 MM MISC Inject into the skin as directed.    . carvedilol (COREG) 25 MG tablet Take 1 tablet (25 mg total) by mouth 2 (two) times daily with a meal. 60 tablet 2  . citalopram (CELEXA) 10 MG tablet Take 1 tablet by mouth once daily 30 tablet 0  . furosemide (LASIX) 20 MG tablet Take 1 tablet (20 mg total) by mouth daily. 30 tablet 11  . gabapentin (NEURONTIN) 100 MG capsule Take 1 capsule (100 mg total) by mouth 3 (three) times daily. 270 capsule 3  . hydrALAZINE (APRESOLINE) 25 MG tablet Take 1 tablet (25 mg total) by mouth 3 (three) times daily. 120 tablet 11  . insulin glargine (LANTUS) 100 UNIT/ML injection Inject 10-14 Units into the skin at bedtime. If over 250 give another 2 units    . isosorbide mononitrate (IMDUR) 30 MG 24 hr tablet Take 2 tablets (60 mg total) by mouth daily. 60 tablet 11  . lidocaine (LIDODERM) 5 % Place 1 patch onto the skin daily. Remove & Discard patch within 12 hours or as directed by MD 30 patch 0  . nitroGLYCERIN (NITROSTAT) 0.4 MG SL tablet Place 1 tablet (0.4 mg total) under the tongue every 5 (five) minutes as needed for chest pain. 30 tablet 12  .  pantoprazole (PROTONIX) 40 MG tablet Take 40 mg by mouth daily.    . rosuvastatin (CRESTOR) 20 MG tablet Take 1 tablet by mouth once daily 30 tablet 0   No current facility-administered medications for this visit.    REVIEW  OF SYSTEMS:  [X]  denotes positive finding, [ ]  denotes negative finding Cardiac  Comments:  Chest pain or chest pressure:    Shortness of breath upon exertion:    Short of breath when lying flat:    Irregular heart rhythm:        Vascular    Pain in calf, thigh, or hip brought on by ambulation:    Pain in feet at night that wakes you up from your sleep:     Blood clot in your veins:    Leg swelling:         Pulmonary    Oxygen at home:    Productive cough:     Wheezing:         Neurologic    Sudden weakness in arms or legs:  x   Sudden numbness in arms or legs:  x   Sudden onset of difficulty speaking or slurred speech: x   Temporary loss of vision in one eye:     Problems with dizziness:         Gastrointestinal    Blood in stool:     Vomited blood:         Genitourinary    Burning when urinating:     Blood in urine:        Psychiatric    Major depression:         Hematologic    Bleeding problems:    Problems with blood clotting too easily:        Skin    Rashes or ulcers:        Constitutional    Fever or chills:      PHYSICAL EXAM: Vitals:   11/27/20 0931  BP: (!) 138/92  Pulse: (!) 112  Resp: 14  Temp: (!) 97.2 F (36.2 C)  TempSrc: Other (Comment)  SpO2: 98%  Weight: 213 lb (96.6 kg)  Height: 5\' 7"  (1.702 m)    GENERAL: The patient is a well-nourished female, in no acute distress. The vital signs are documented above. CARDIOVASCULAR: 2+ radial pulses bilaterally.  I do not hear carotid bruits bilaterally PULMONARY: There is good air exchange  MUSCULOSKELETAL: There are no major deformities or cyanosis. NEUROLOGIC: Marked reduction in grip strength in her right arm versus left.  Slow affect.  Significant aphasia. SKIN:  There are no ulcers or rashes noted. PSYCHIATRIC: The patient has a normal affect.  DATA:  MRI, prior arteriograms and recent CT were all reviewed.  She does not have any evidence of aneurysm in her chest or abdomen.  Carotid duplex today in our office reveals widely patent extracranial carotids with no level of stenosis.  MEDICAL ISSUES: I reviewed these findings with the patient.  I do not see any evidence of extracranial carotid disease.  She will see [place] again on an as-needed basis   Rosetta Posner, MD [facility] Tel [phone] Pager [phone]  Note: Portions of this report may have been transcribed using voice recognition software.  Every effort has been made to ensure accuracy; however, inadvertent computerized transcription errors may still be present.

## 2020-11-27 NOTE — Telephone Encounter (Signed)
Patient's sister is calling in stating she missed a call, informed that someone has been trying to contact her about the FL2 forms, sister is going to drop them off today.

## 2020-11-27 NOTE — Telephone Encounter (Signed)
Error

## 2020-11-28 NOTE — Telephone Encounter (Signed)
Paperwork has been dropped off at the office today.

## 2020-11-29 ENCOUNTER — Telehealth: Payer: Self-pay

## 2020-11-29 NOTE — Telephone Encounter (Signed)
.   LAST APPOINTMENT DATE: 11/27/2020   NEXT APPOINTMENT DATE:@Visit  date not found  MEDICATION:carvedilol (COREG) 25 MG tablet(Expired)   [place], [address]

## 2020-11-29 NOTE — Telephone Encounter (Signed)
Ok to refill Rx 

## 2020-11-29 NOTE — Telephone Encounter (Signed)
Patient has been completely out since Monday.

## 2020-11-30 ENCOUNTER — Other Ambulatory Visit: Payer: Self-pay | Admitting: *Deleted

## 2020-11-30 MED ORDER — CARVEDILOL 25 MG PO TABS: 25.0000 mg | ORAL_TABLET | Freq: Two times a day (BID) | ORAL | 2 refills | Status: DC

## 2020-11-30 NOTE — Telephone Encounter (Signed)
Ok with me. Please place any necessary orders. 

## 2020-11-30 NOTE — Telephone Encounter (Signed)
Form placed to be sign

## 2020-11-30 NOTE — Telephone Encounter (Signed)
Pt sister would like for someone to give her a call.

## 2020-11-30 NOTE — Telephone Encounter (Signed)
Rx send to pharmacy  

## 2020-11-30 NOTE — Telephone Encounter (Signed)
Pt sister called back follow up on this. Please advise.

## 2020-12-01 NOTE — Telephone Encounter (Signed)
Pt sister notified #[phone] ready to be pick up

## 2020-12-01 NOTE — Telephone Encounter (Signed)
Sister pick up form

## 2020-12-14 ENCOUNTER — Other Ambulatory Visit: Payer: Self-pay | Admitting: Physician Assistant

## 2020-12-21 ENCOUNTER — Telehealth: Payer: Self-pay

## 2020-12-21 NOTE — Telephone Encounter (Signed)
Robin [name] from [facility] home [facility] states patient was discharges with goals me   Any questions we can LVM on phone [phone]

## 2020-12-22 ENCOUNTER — Telehealth: Payer: Self-pay

## 2020-12-22 NOTE — Telephone Encounter (Signed)
Home Health Verbal Orders  Agency:  [facility]   Requesting continuation of Speech  Frequency:  Twice a week for 4 weeks then once a week for 4 weeks.

## 2020-12-22 NOTE — Telephone Encounter (Signed)
See note

## 2020-12-25 NOTE — Telephone Encounter (Signed)
Ok with me. Please place any necessary orders. 

## 2020-12-25 NOTE — Telephone Encounter (Signed)
Dr. Jerline Pain okay for verbal order for Speech Therapy for pt?

## 2020-12-26 NOTE — Telephone Encounter (Signed)
LVM with VO for speech therapy To Gardiner Fanti

## 2020-12-31 ENCOUNTER — Emergency Department (HOSPITAL_COMMUNITY): Payer: 59

## 2020-12-31 ENCOUNTER — Observation Stay (HOSPITAL_COMMUNITY)
Admission: EM | Admit: 2020-12-31 | Discharge: 2021-01-02 | Disposition: A | Discharge status: Home / Self Care | Payer: 59 | Attending: Family Medicine | Admitting: Family Medicine

## 2020-12-31 ENCOUNTER — Observation Stay (HOSPITAL_COMMUNITY): Payer: 59

## 2020-12-31 ENCOUNTER — Other Ambulatory Visit: Payer: Self-pay

## 2020-12-31 DIAGNOSIS — I130 Hypertensive heart and chronic kidney disease with heart failure and stage 1 through stage 4 chronic kidney disease, or unspecified chronic kidney disease: Secondary | ICD-10-CM | POA: Insufficient documentation

## 2020-12-31 DIAGNOSIS — R9431 Abnormal electrocardiogram [ECG] [EKG]: Secondary | ICD-10-CM | POA: Diagnosis present

## 2020-12-31 DIAGNOSIS — Z794 Long term (current) use of insulin: Secondary | ICD-10-CM | POA: Diagnosis not present

## 2020-12-31 DIAGNOSIS — E1165 Type 2 diabetes mellitus with hyperglycemia: Secondary | ICD-10-CM | POA: Diagnosis not present

## 2020-12-31 DIAGNOSIS — Y900 Blood alcohol level of less than 20 mg/100 ml: Secondary | ICD-10-CM | POA: Insufficient documentation

## 2020-12-31 DIAGNOSIS — J449 Chronic obstructive pulmonary disease, unspecified: Secondary | ICD-10-CM | POA: Diagnosis present

## 2020-12-31 DIAGNOSIS — I5032 Chronic diastolic (congestive) heart failure: Secondary | ICD-10-CM | POA: Diagnosis present

## 2020-12-31 DIAGNOSIS — Z20822 Contact with and (suspected) exposure to covid-19: Secondary | ICD-10-CM | POA: Diagnosis not present

## 2020-12-31 DIAGNOSIS — Z7901 Long term (current) use of anticoagulants: Secondary | ICD-10-CM | POA: Insufficient documentation

## 2020-12-31 DIAGNOSIS — G9341 Metabolic encephalopathy: Secondary | ICD-10-CM | POA: Diagnosis not present

## 2020-12-31 DIAGNOSIS — R059 Cough, unspecified: Secondary | ICD-10-CM

## 2020-12-31 DIAGNOSIS — E1122 Type 2 diabetes mellitus with diabetic chronic kidney disease: Secondary | ICD-10-CM | POA: Diagnosis not present

## 2020-12-31 DIAGNOSIS — E1143 Type 2 diabetes mellitus with diabetic autonomic (poly)neuropathy: Secondary | ICD-10-CM

## 2020-12-31 DIAGNOSIS — Z7982 Long term (current) use of aspirin: Secondary | ICD-10-CM | POA: Diagnosis not present

## 2020-12-31 DIAGNOSIS — I10 Essential (primary) hypertension: Secondary | ICD-10-CM | POA: Diagnosis present

## 2020-12-31 DIAGNOSIS — I482 Chronic atrial fibrillation, unspecified: Secondary | ICD-10-CM | POA: Diagnosis not present

## 2020-12-31 DIAGNOSIS — N183 Chronic kidney disease, stage 3 unspecified: Secondary | ICD-10-CM | POA: Diagnosis not present

## 2020-12-31 DIAGNOSIS — Z87891 Personal history of nicotine dependence: Secondary | ICD-10-CM | POA: Diagnosis not present

## 2020-12-31 DIAGNOSIS — R4182 Altered mental status, unspecified: Secondary | ICD-10-CM | POA: Diagnosis present

## 2020-12-31 DIAGNOSIS — R739 Hyperglycemia, unspecified: Secondary | ICD-10-CM | POA: Diagnosis present

## 2020-12-31 DIAGNOSIS — M546 Pain in thoracic spine: Secondary | ICD-10-CM

## 2020-12-31 DIAGNOSIS — R410 Disorientation, unspecified: Secondary | ICD-10-CM

## 2020-12-31 LAB — CBG MONITORING, ED: Glucose-Capillary: 479 mg/dL — ABNORMAL HIGH (ref 70–99)

## 2020-12-31 LAB — COMPREHENSIVE METABOLIC PANEL
ALT: 13 U/L (ref 0–44)
AST: 16 U/L (ref 15–41)
Albumin: 3.3 g/dL — ABNORMAL LOW (ref 3.5–5.0)
Alkaline Phosphatase: 115 U/L (ref 38–126)
Anion gap: 9 (ref 5–15)
BUN: 20 mg/dL (ref 6–20)
CO2: 25 mmol/L (ref 22–32)
Calcium: 8.2 mg/dL — ABNORMAL LOW (ref 8.9–10.3)
Chloride: 97 mmol/L — ABNORMAL LOW (ref 98–111)
Creatinine, Ser: 1.85 mg/dL — ABNORMAL HIGH (ref 0.44–1.00)
GFR, Estimated: 32 mL/min — ABNORMAL LOW (ref >60)
Glucose, Bld: 498 mg/dL — ABNORMAL HIGH (ref 70–99)
Potassium: 3.8 mmol/L (ref 3.5–5.1)
Sodium: 131 mmol/L — ABNORMAL LOW (ref 135–145)
Total Bilirubin: 0.5 mg/dL (ref 0.3–1.2)
Total Protein: 7.1 g/dL (ref 6.5–8.1)

## 2020-12-31 LAB — URINALYSIS, ROUTINE W REFLEX MICROSCOPIC
Bilirubin Urine: NEGATIVE
Glucose, UA: >=500 mg/dL — AB
Hgb urine dipstick: NEGATIVE
Ketones, ur: NEGATIVE mg/dL
Nitrite: NEGATIVE
Protein, ur: NEGATIVE mg/dL
Specific Gravity, Urine: 1.016 (ref 1.005–1.030)
pH: 6 (ref 5.0–8.0)

## 2020-12-31 LAB — DIFFERENTIAL
Abs Immature Granulocytes: 0.02 10*3/uL (ref 0.00–0.07)
Basophils Absolute: 0 10*3/uL (ref 0.0–0.1)
Basophils Relative: 1 %
Eosinophils Absolute: 0.1 10*3/uL (ref 0.0–0.5)
Eosinophils Relative: 2 %
Immature Granulocytes: 0 %
Lymphocytes Relative: 34 %
Lymphs Abs: 2.3 10*3/uL (ref 0.7–4.0)
Monocytes Absolute: 0.6 10*3/uL (ref 0.1–1.0)
Monocytes Relative: 9 %
Neutro Abs: 3.7 10*3/uL (ref 1.7–7.7)
Neutrophils Relative %: 54 %

## 2020-12-31 LAB — I-STAT CHEM 8, ED
BUN: 20 mg/dL (ref 6–20)
Calcium, Ion: 1.21 mmol/L (ref 1.15–1.40)
Chloride: 98 mmol/L (ref 98–111)
Creatinine, Ser: 1.8 mg/dL — ABNORMAL HIGH (ref 0.44–1.00)
Glucose, Bld: 462 mg/dL — ABNORMAL HIGH (ref 70–99)
HCT: 37 % (ref 36.0–46.0)
Hemoglobin: 12.6 g/dL (ref 12.0–15.0)
Potassium: 4 mmol/L (ref 3.5–5.1)
Sodium: 136 mmol/L (ref 135–145)
TCO2: 25 mmol/L (ref 22–32)

## 2020-12-31 LAB — GLUCOSE, CAPILLARY: Glucose-Capillary: 272 mg/dL — ABNORMAL HIGH (ref 70–99)

## 2020-12-31 LAB — CBC
HCT: 37.3 % (ref 36.0–46.0)
Hemoglobin: 11.4 g/dL — ABNORMAL LOW (ref 12.0–15.0)
MCH: 24.4 pg — ABNORMAL LOW (ref 26.0–34.0)
MCHC: 30.6 g/dL (ref 30.0–36.0)
MCV: 79.9 fL — ABNORMAL LOW (ref 80.0–100.0)
Platelets: 244 10*3/uL (ref 150–400)
RBC: 4.67 MIL/uL (ref 3.87–5.11)
RDW: 15.8 % — ABNORMAL HIGH (ref 11.5–15.5)
WBC: 6.7 10*3/uL (ref 4.0–10.5)
nRBC: 0 % (ref 0.0–0.2)

## 2020-12-31 LAB — RAPID URINE DRUG SCREEN, HOSP PERFORMED
Amphetamines: NOT DETECTED
Barbiturates: NOT DETECTED
Benzodiazepines: NOT DETECTED
Cocaine: NOT DETECTED
Opiates: NOT DETECTED
Tetrahydrocannabinol: NOT DETECTED

## 2020-12-31 LAB — MAGNESIUM: Magnesium: 1.8 mg/dL (ref 1.7–2.4)

## 2020-12-31 LAB — PROTIME-INR
INR: 1.6 — ABNORMAL HIGH (ref 0.8–1.2)
Prothrombin Time: 19 seconds — ABNORMAL HIGH (ref 11.4–15.2)

## 2020-12-31 LAB — RESP PANEL BY RT-PCR (FLU A&B, COVID) ARPGX2
Influenza A by PCR: NEGATIVE
Influenza B by PCR: NEGATIVE
SARS Coronavirus 2 by RT PCR: NEGATIVE

## 2020-12-31 LAB — ETHANOL: Alcohol, Ethyl (B): <10 mg/dL (ref <10)

## 2020-12-31 LAB — APTT: aPTT: 27 seconds (ref 24–36)

## 2020-12-31 IMAGING — CT CT HEAD CODE STROKE
3 series · 16 of 47 positions shown, 19 images · non-contrast
Comparison: [DATE].

CLINICAL DATA: Code stroke.  Neuro deficit, acute stroke suspected.

EXAM:
CT HEAD WITHOUT CONTRAST
TECHNIQUE: Contiguous axial images were obtained from the base of the skull
through the vertex without intravenous contrast.

[Series 3: head w o · axial · 0.48mm/px · z∈[+1390,+1520]mm · 10 of 32 slices shown, 13 images]
[im 3/32  brain]
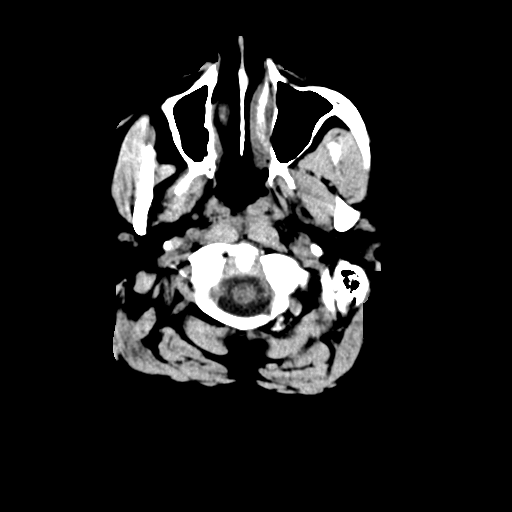
[im 3/32  bone]
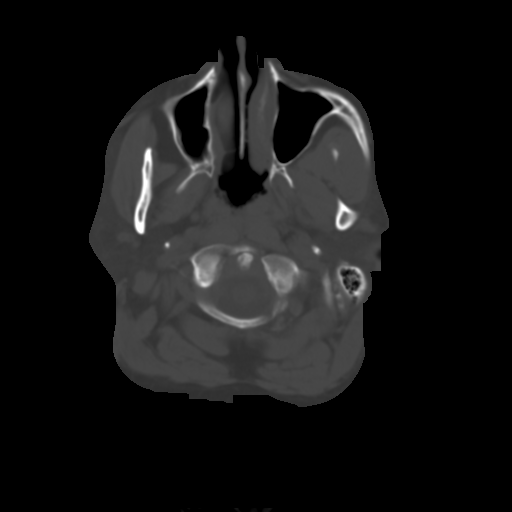
[im 6/32  brain]
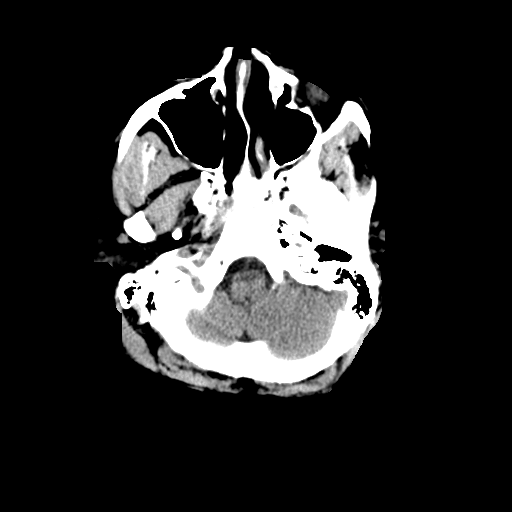
[im 9/32  brain]
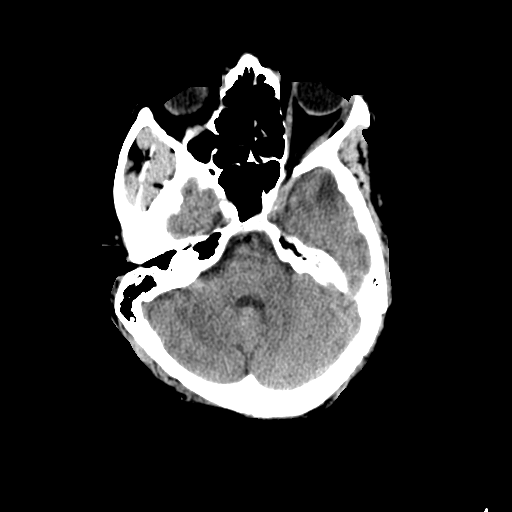
[im 11/32  brain]
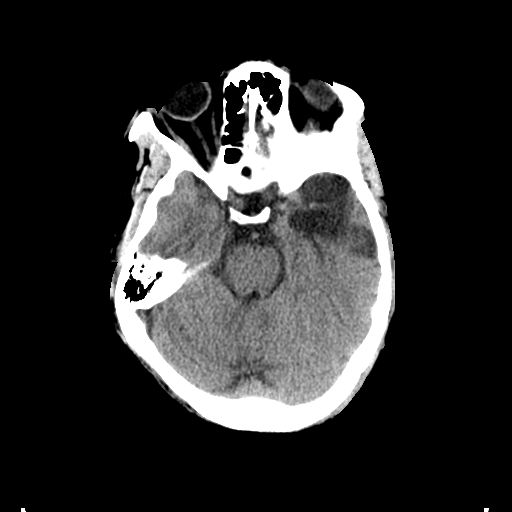
[im 14/32  brain]
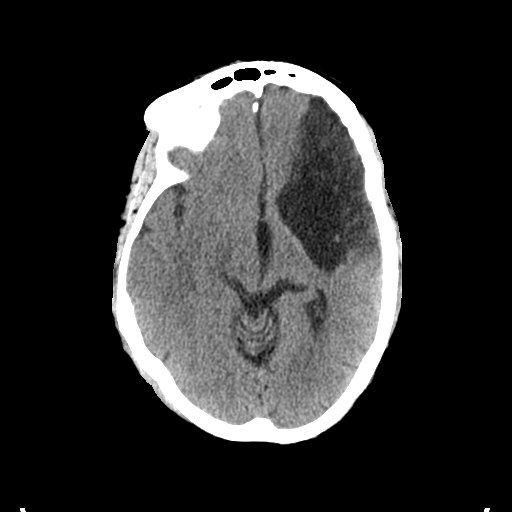
[im 14/32  bone]
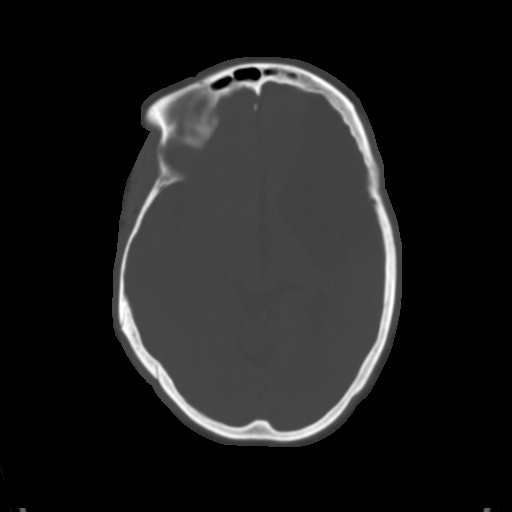
[im 18/32  brain]
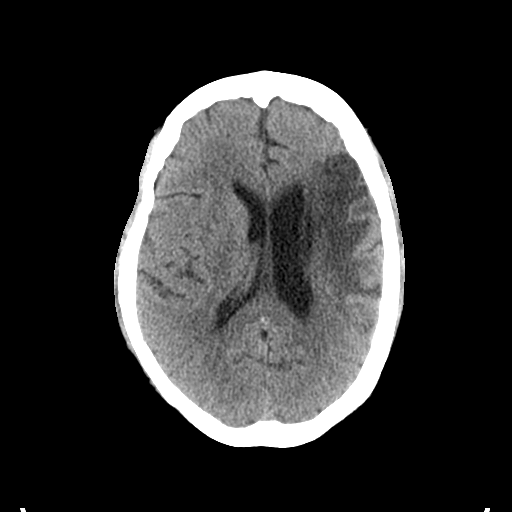
[im 21/32  brain]
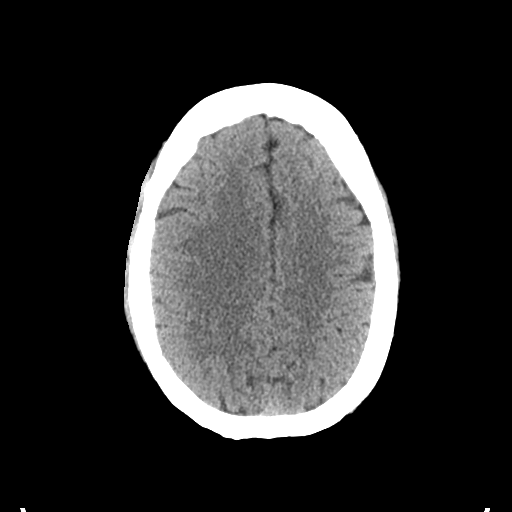
[im 24/32  brain]
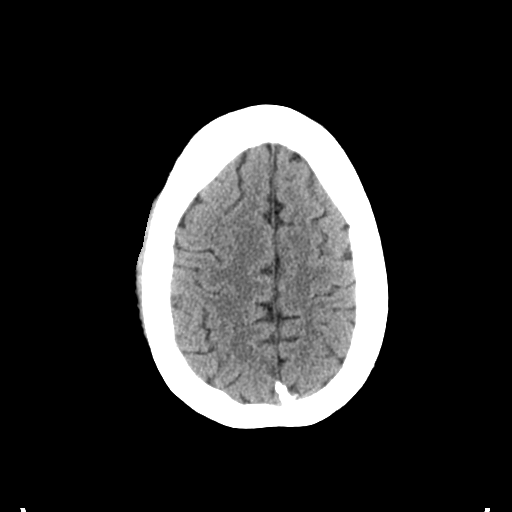
[im 26/32  brain]
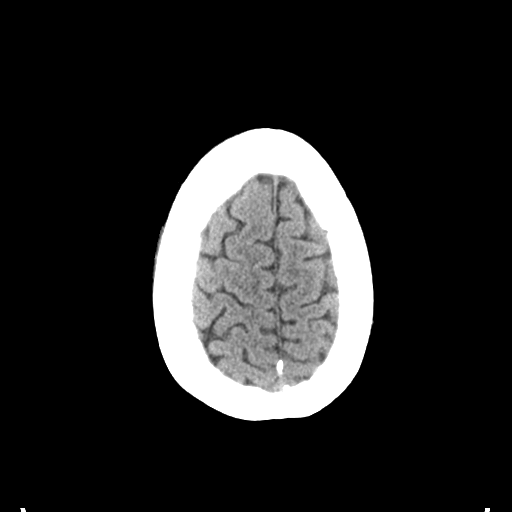
[im 26/32  bone]
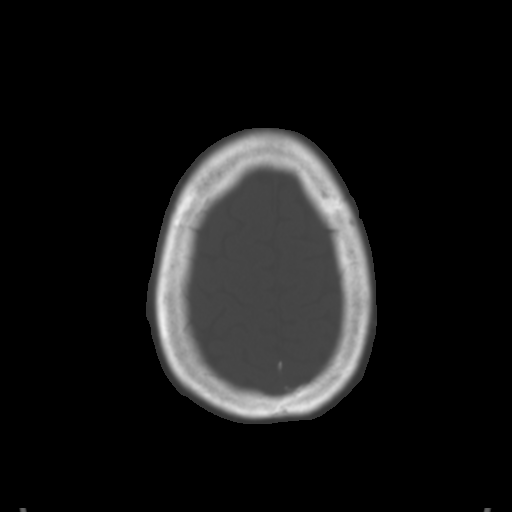
[im 29/32  brain]
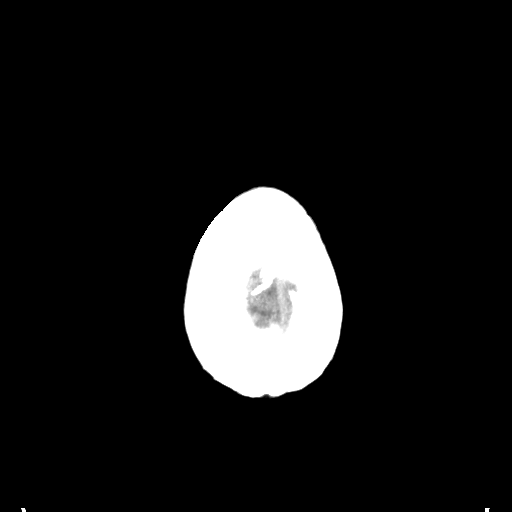

[Series 5: coronal soft · coronal · 0.34mm/px · 3 of 67 slices shown]
[im 23/67  brain]
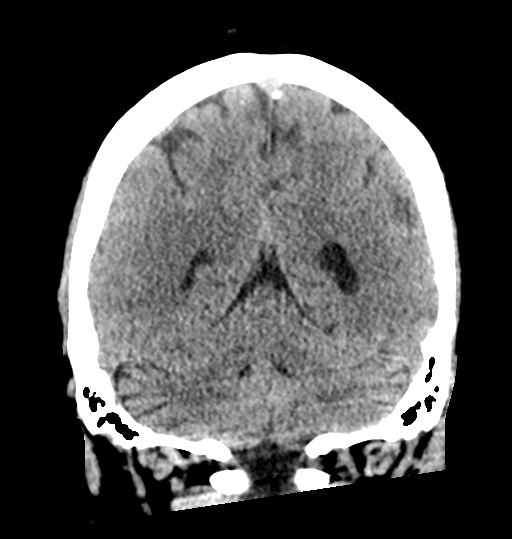
[im 30/67  brain]
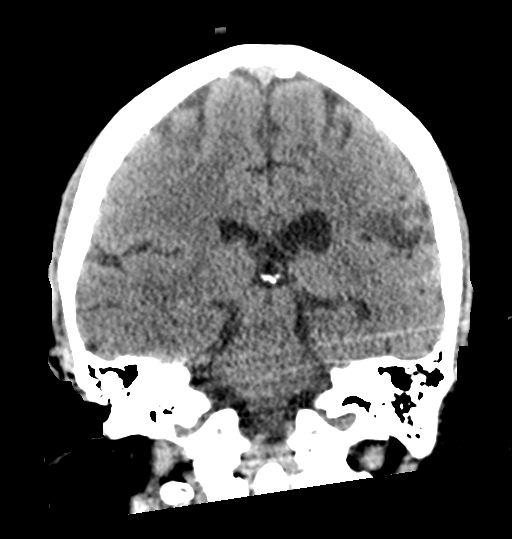
[im 37/67  brain]
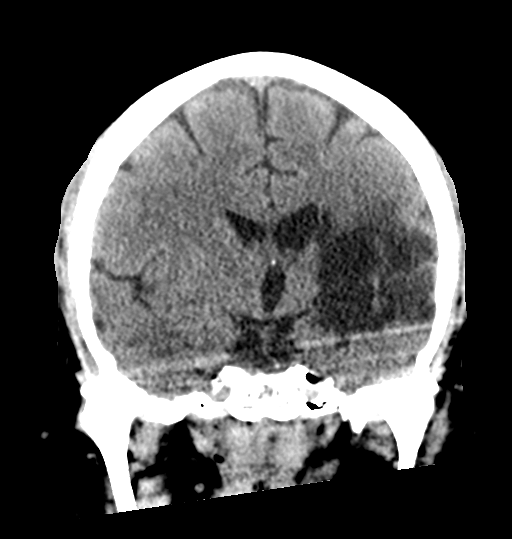

[Series 6: sagittal soft · sagittal · 0.36mm/px · 3 of 57 slices shown]
[im 19/57  brain]
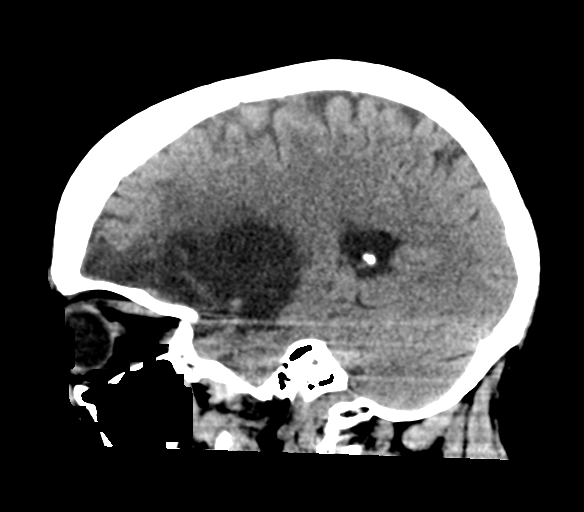
[im 29/57  brain]
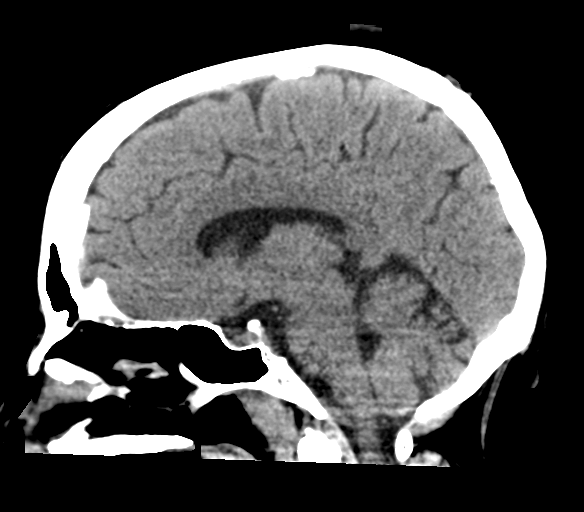
[im 38/57  brain]
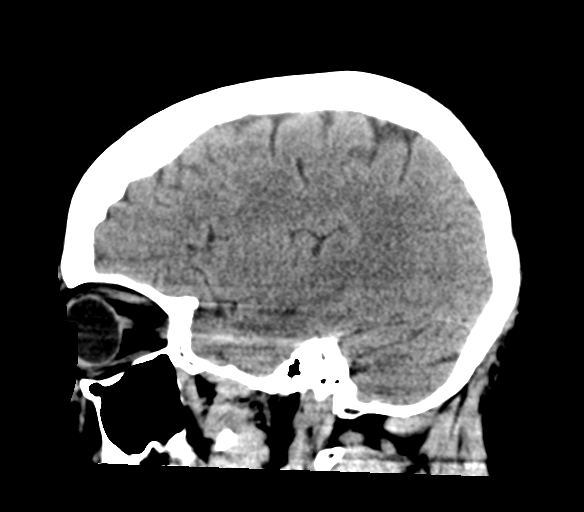

[16 of 47 positions shown; findings below may reference images not displayed]

FINDINGS: Brain: No evidence of acute large vascular territory infarct, acute
hemorrhage, hydrocephalus, mass lesion or sizeable extra-axial fluid
collection. Similar appearance of a large left MCA territory remote
infarct. Additional small right posterior temporal lobe infarct was
better characterized on prior MRI.

Vascular: No hyperdense vessel identified.

Skull: No acute fracture.

Sinuses/Orbits: Mild ethmoid air cell mucosal thickening without
air-fluid levels.

Other: No mastoid effusions.

ASPECTS (Alberta Stroke Program Early CT Score) total score (0-10
with 10 being normal): 10.
IMPRESSION: 1. No evidence of acute large vascular territory infarct or acute
hemorrhage. ASPECTS is 10
2. Similar appearance of a large left MCA territory remote infarct.
An MRI could better evaluate for acute on chronic peri-infarct
ischemia if clinically indicated.

Code stroke imaging results were communicated on [DATE] at [DATE] to provider Dr. MEHARI Via telephone, who verbally acknowledged
these results.

## 2020-12-31 IMAGING — DX DG CHEST 2V
3 series · 3 of 3 positions shown · non-contrast
Comparison: Chest radiograph dated [DATE].

CLINICAL DATA: 54-year-old female with cough and shortness of
breath.

EXAM:
CHEST - 2 VIEW

[chest lat]
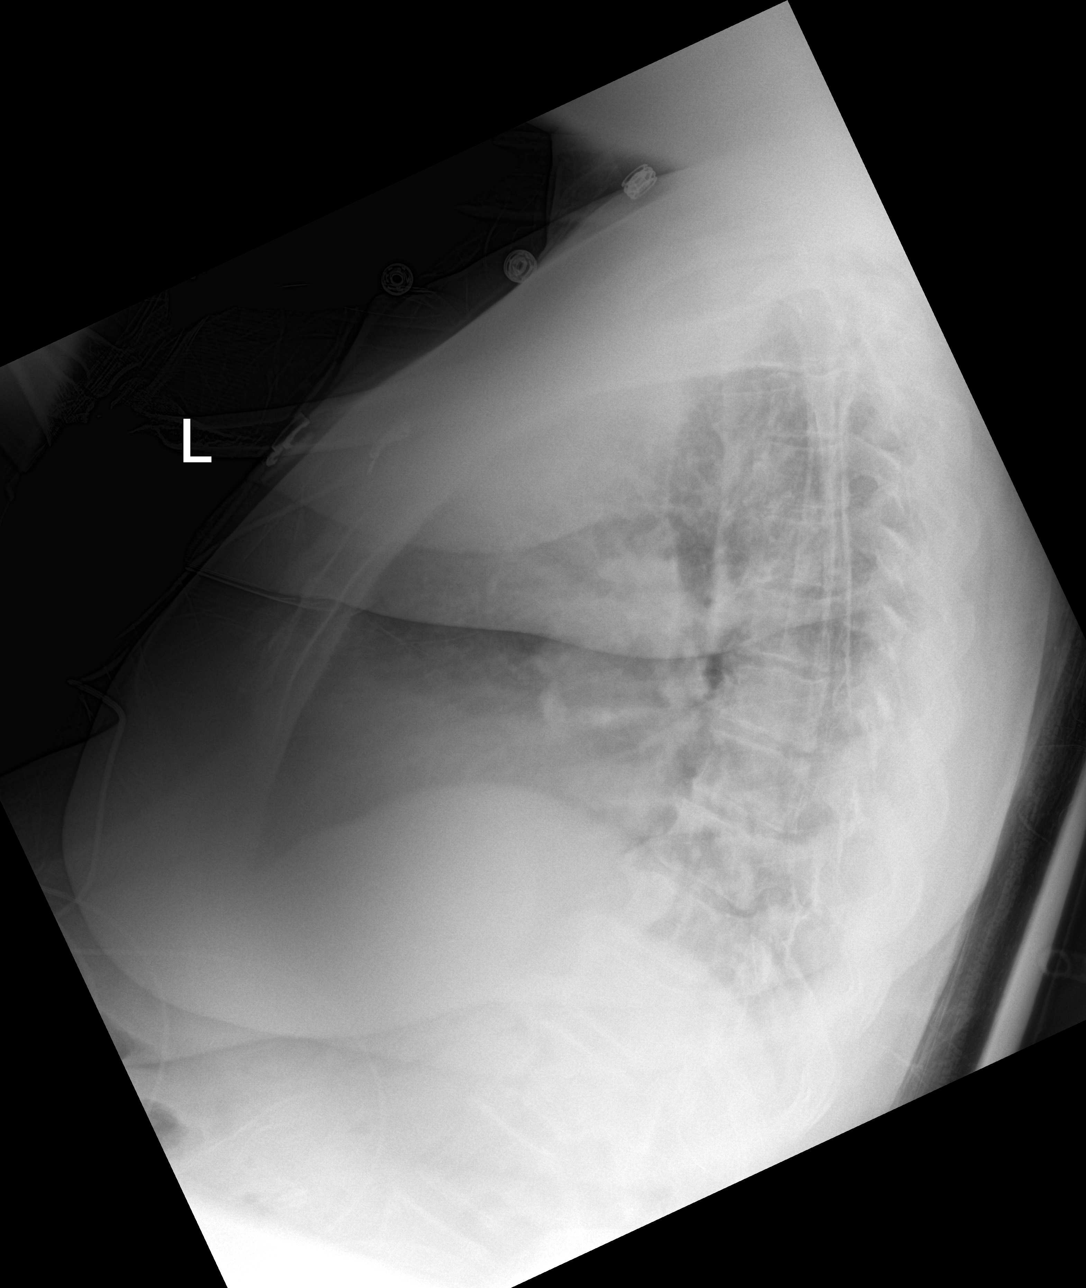

[chest ap (1 of 2)]
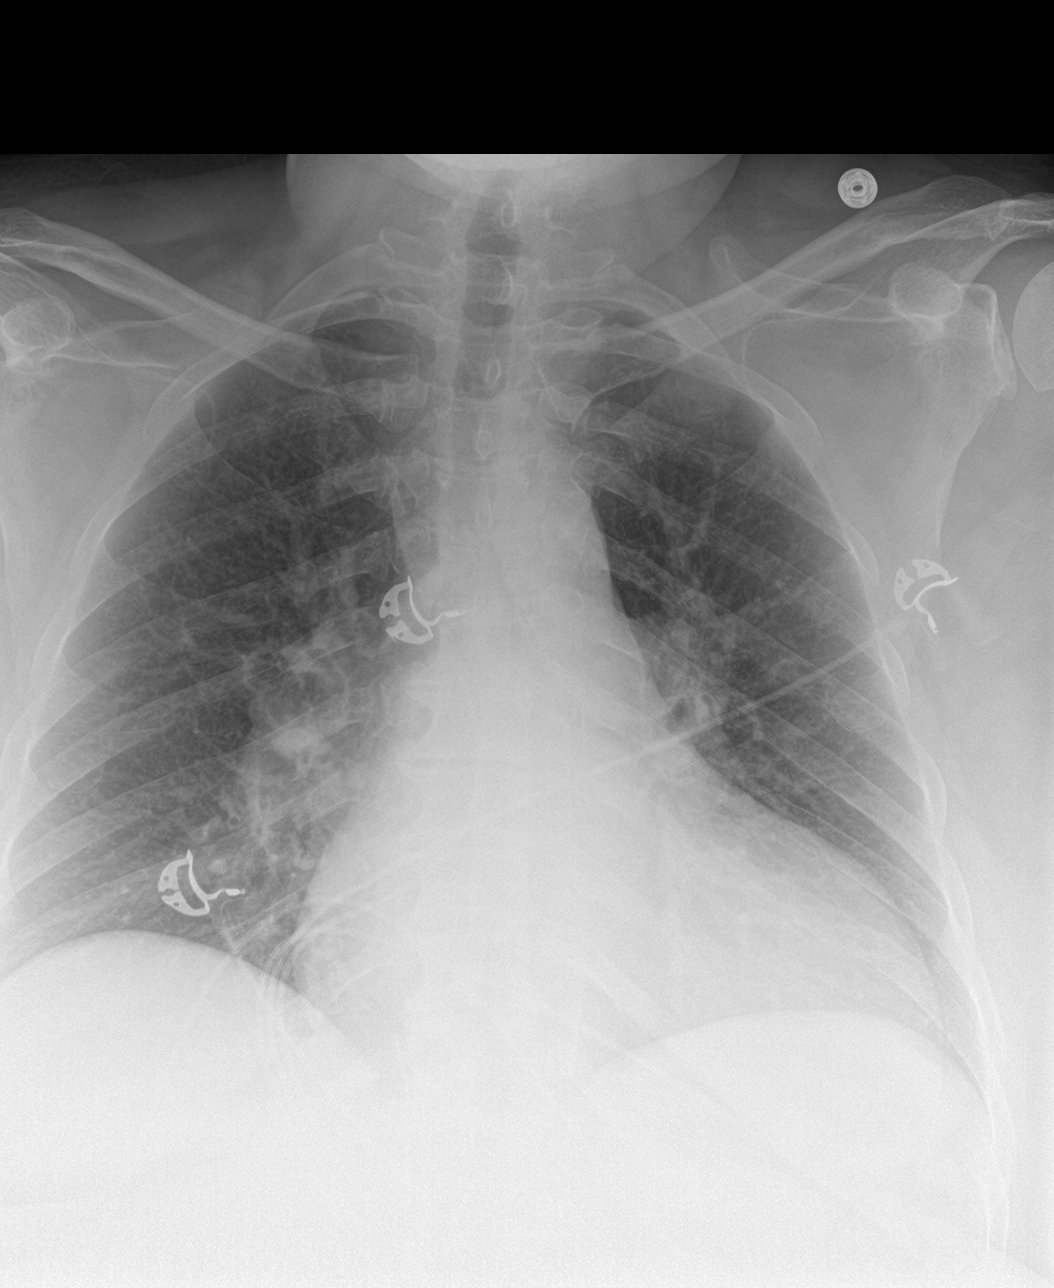

[chest ap (2 of 2)]
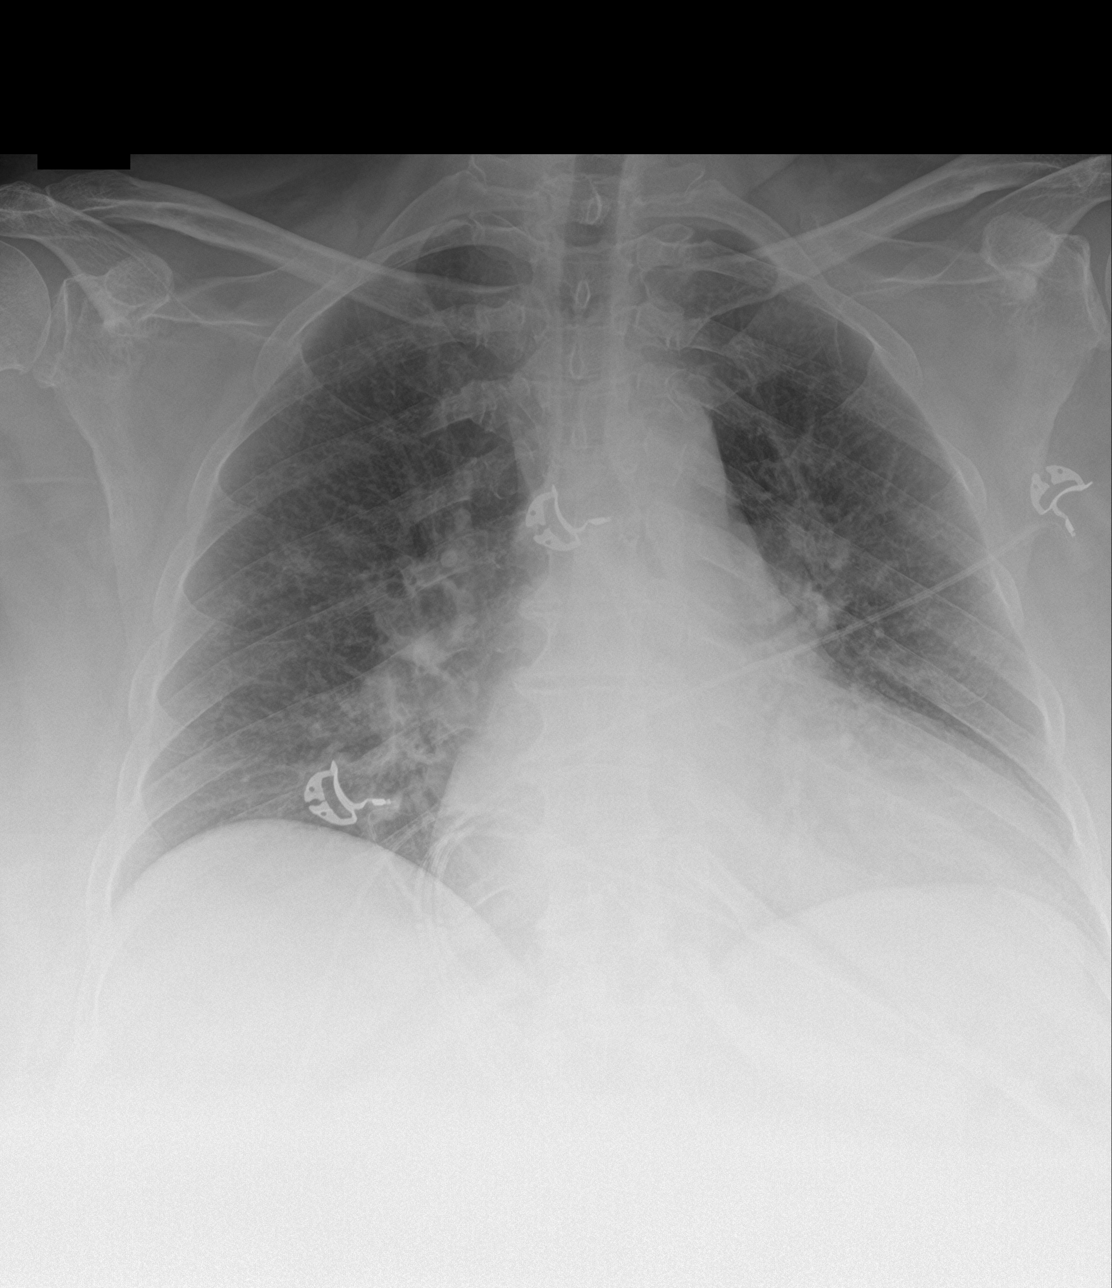

[3 of 3 positions shown; findings below may reference images not displayed]

FINDINGS: There is cardiomegaly with mild vascular congestion. No focal
consolidation, pleural effusion, or pneumothorax. No acute osseous
pathology.
IMPRESSION: Cardiomegaly with mild vascular congestion.

## 2020-12-31 MED ORDER — SODIUM CHLORIDE 0.9 % IV SOLN
1.0000 g | INTRAVENOUS | Status: DC
  Administered 2020-12-31 – 2021-01-01 (×2): 1 g via INTRAVENOUS
  Filled 2020-12-31 (×2): qty 10

## 2020-12-31 MED ORDER — ACETAMINOPHEN 325 MG PO TABS: 650.0000 mg | ORAL_TABLET | Freq: Four times a day (QID) | ORAL | Status: DC | PRN

## 2020-12-31 MED ORDER — MORPHINE SULFATE (PF) 2 MG/ML IV SOLN
2.0000 mg | Freq: Once | INTRAVENOUS | Status: AC
  Administered 2020-12-31: 2 mg via INTRAVENOUS
  Filled 2020-12-31: qty 1

## 2020-12-31 MED ORDER — ROSUVASTATIN CALCIUM 20 MG PO TABS
20.0000 mg | ORAL_TABLET | Freq: Every day | ORAL | Status: DC
  Administered 2021-01-01: 20 mg via ORAL
  Filled 2020-12-31: qty 1

## 2020-12-31 MED ORDER — INSULIN ASPART 100 UNIT/ML IJ SOLN
0.0000 [IU] | INTRAMUSCULAR | Status: DC
  Administered 2020-12-31: 8 [IU] via SUBCUTANEOUS
  Administered 2020-12-31: 11 [IU] via SUBCUTANEOUS
  Administered 2021-01-01 (×2): 3 [IU] via SUBCUTANEOUS
  Administered 2021-01-01: 8 [IU] via SUBCUTANEOUS

## 2020-12-31 MED ORDER — POLYETHYLENE GLYCOL 3350 17 G PO PACK: 17.0000 g | PACK | Freq: Every day | ORAL | Status: DC | PRN

## 2020-12-31 MED ORDER — POTASSIUM CHLORIDE IN NACL 20-0.9 MEQ/L-% IV SOLN: INTRAVENOUS | Status: DC

## 2020-12-31 MED ORDER — IPRATROPIUM-ALBUTEROL 0.5-2.5 (3) MG/3ML IN SOLN: 3.0000 mL | RESPIRATORY_TRACT | Status: DC | PRN

## 2020-12-31 MED ORDER — SODIUM CHLORIDE 0.9 % IV BOLUS
500.0000 mL | Freq: Once | INTRAVENOUS | Status: AC
  Administered 2020-12-31: 500 mL via INTRAVENOUS

## 2020-12-31 MED ORDER — ACETAMINOPHEN 650 MG RE SUPP: 650.0000 mg | Freq: Four times a day (QID) | RECTAL | Status: DC | PRN

## 2020-12-31 MED ORDER — INSULIN GLARGINE 100 UNIT/ML ~~LOC~~ SOLN
20.0000 [IU] | Freq: Every day | SUBCUTANEOUS | Status: DC
  Administered 2020-12-31: 20 [IU] via SUBCUTANEOUS
  Filled 2020-12-31 (×2): qty 0.2

## 2020-12-31 MED ORDER — APIXABAN 5 MG PO TABS
5.0000 mg | ORAL_TABLET | Freq: Two times a day (BID) | ORAL | Status: DC
  Administered 2020-12-31 – 2021-01-02 (×4): 5 mg via ORAL
  Filled 2020-12-31 (×4): qty 1

## 2020-12-31 MED ORDER — IPRATROPIUM-ALBUTEROL 0.5-2.5 (3) MG/3ML IN SOLN
3.0000 mL | Freq: Four times a day (QID) | RESPIRATORY_TRACT | Status: AC
  Administered 2020-12-31: 3 mL via RESPIRATORY_TRACT
  Filled 2020-12-31 (×2): qty 3

## 2020-12-31 NOTE — Progress Notes (Signed)
Neurology Brief Note  Charlestine Rookstool is a 55 yo woman with hx remote large L MCA infarct with residual R-sided weakness, dysarthria, and aphasia. She became acutely altered around 1300 today. Stroke code was called on arrival to ED for R-sided weakness, dysarthria, and aphasia, but these appear to be largely (if not entirely chronic) based on completed L MCA infarct with encephalomalacia seen on CT. BG 479 may also be causing some recrudescence. Even if she did have slight worsening of those deficits she would not be a tPA candidate 2/2 the fact that she is on eliquis and has multiple known cerebrovascular aneurysms. Of note there is no blood on her head CT. D/w EDP Nanda Quinton and we decied to cancel the stroke code for the above reasons. She will be admitted to [facility] for AMS workup and if neurologic concerns persist she can be seen on a non-stroke code basis by the in-house neurologist there. Dr. Laverta Baltimore will let me know if anything changes such that I can be of assistance via teleneurology.   Su Monks, MD [facility] [phone] If 7pm- 7am, please page neurology on call as listed in [place].

## 2020-12-31 NOTE — ED Notes (Signed)
Called CT for Code STROKE @ 4604

## 2020-12-31 NOTE — ED Notes (Signed)
Report given to Brittany RN

## 2020-12-31 NOTE — Plan of Care (Signed)

## 2020-12-31 NOTE — ED Provider Notes (Signed)
Emergency Department Provider Note   I have reviewed the triage vital signs and the nursing notes.   HISTORY  Chief Complaint Altered Mental Status   HPI Robin [name] is a 55 y.o. female arrives to the emergency department by EMS after she developed acute onset altered mental status.  She was apparently visiting family/friends when she abruptly became unresponsive.  EMS arrived and found the patient with some right side weakness.  There is report of some prior stroke and question if this may be chronic but family/friends unable to provide additional history.  Exam is somewhat mixed.  She seems to have some speech disturbance but EMS report this is intermittent.  Patient is unable to provide significant history at this time. Level 5 caveat: AMS   Past Medical History:  Diagnosis Date  . A-fib ([place])   . Abdominal aortic aneurysm (AAA) ([place])   . Allergy   . Anemia   . Arthritis   . Atrial fibrillation ([place])   . CHF (congestive heart failure) ([place])    a. EF at 30-35% by echo in 08/2018 b. EF at 35% by repeat echo in 05/2019  . Chronic abdominal pain   . Chronic atrial fibrillation ([place])   . Cocaine abuse ([place])   . COPD (chronic obstructive pulmonary disease) ([place])   . Diabetes mellitus without complication ([place])   . Essential hypertension, benign   . Expressive aphasia   . Expressive aphasia    post CVA  . Fatty liver   . GERD (gastroesophageal reflux disease)   . Gout 2016  . Normal coronary arteries    3/10 - following abnormal Myoview  . Ovarian cyst   . Stroke ([place]) 12/26/2019   Right sided weakness, and expressive aphasia  . Stroke ([place])   . Thoracic ascending aortic aneurysm (H[facility])    4 cm 10/31/19 CTA  . Type 2 diabetes mellitus ([place])    type II    Patient Active Problem List   Diagnosis Date Noted  . Acute metabolic encephalopathy 27/01/2375  . Hyperglycemia 12/31/2020  . Prolonged QT interval 05/12/2020  . Atrial fibrillation, chronic ([place]) 05/12/2020   . Right hemiparesis ([place])   . Essential hypertension   . Chronic diastolic congestive heart failure ([place])   . Dysphagia due to recent stroke 01/05/2020  . Aneurysm of right carotid artery ([place]) paraclinoid 01/05/2020  . Embolic cerebral infarction [facility]) s/p clot retrieval 12/27/2019  . Thoracic aortic aneurysm without rupture ([place])   . Type 2 diabetes mellitus with diabetic autonomic neuropathy, with Ivon Roedel-term current use of insulin ([place]) 09/01/2018  . Cocaine abuse ([place]) 08/25/2018  . Cigarette nicotine dependence, uncomplicated 28/31/5176  . GERD (gastroesophageal reflux disease) 06/07/2011  . COPD (chronic obstructive pulmonary disease) ([place]) 06/07/2011  . Arthritis 06/07/2011    Past Surgical History:  Procedure Laterality Date  . ABDOMINAL HYSTERECTOMY  09/10/2011   Procedure: HYSTERECTOMY ABDOMINAL;  Surgeon: Jonnie Kind, MD;  Location: [facility];  Service: Gynecology;  Laterality: N/A;  Abdominal hysterectomy  . CESAREAN SECTION  [id number], and 1994  . CHOLECYSTECTOMY  1995  . IR 3D INDEPENDENT WKST  03/16/2020  . IR ANGIO INTRA EXTRACRAN SEL INTERNAL CAROTID BILAT MOD SED  03/16/2020  . IR ANGIO VERTEBRAL SEL SUBCLAVIAN INNOMINATE UNI L MOD SED  03/16/2020  . IR ANGIO VERTEBRAL SEL VERTEBRAL UNI R MOD SED  03/16/2020  . IR CT HEAD LTD  12/27/2019  . IR PERCUTANEOUS ART THROMBECTOMY/INFUSION INTRACRANIAL INC DIAG ANGIO  12/27/2019  .  IR US GUIDE VASC ACCESS RIGHT  12/27/2019  . IR US GUIDE VASC ACCESS RIGHT  03/16/2020  . RADIOLOGY WITH ANESTHESIA N/A 12/27/2019   Procedure: IR WITH ANESTHESIA;  Surgeon: Luanne Bras, MD;  Location: [facility];  Service: Radiology;  Laterality: N/A;  . SCAR REVISION  09/10/2011   Procedure: SCAR REVISION;  Surgeon: Jonnie Kind, MD;  Location: [facility];  Service: Gynecology;  Laterality: N/A;  Wide Excision of old Cicatrix  . TUBAL LIGATION  1994    Allergies Bee venom, Losartan, Naproxen, Penicillins, and Lisinopril  Family History  Problem  Relation Age of Onset  . Cirrhosis Mother   . Early death Mother   . Alcohol abuse Mother   . Diabetes type II Father   . Alcohol abuse Father   . Diabetes type II Sister   . Early death Brother   . Alcohol abuse Son   . Anesthesia problems Neg Hx   . Hypotension Neg Hx   . Malignant hyperthermia Neg Hx   . Pseudochol deficiency Neg Hx   . Colon cancer Neg Hx   . Colon polyps Neg Hx   . Esophageal cancer Neg Hx   . Rectal cancer Neg Hx   . Stomach cancer Neg Hx     Social History Social History   Tobacco Use  . Smoking status: Former Smoker    Years: 29.00    Types: Cigarettes    Quit date: 12/2019    Years since quitting: 1.0  . Smokeless tobacco: Never Used  Vaping Use  . Vaping Use: Never used  Substance Use Topics  . Alcohol use: Never    Comment: occ  . Drug use: Never    Types: Cocaine, Marijuana    Review of Systems  Level 5 caveat: AMS   ____________________________________________   PHYSICAL EXAM:  VITAL SIGNS: Temp: 97.7 F Resp: 18 SpO2: 98% Pulse: 121 BP: 138/89  Constitutional: Alert. Dysarthria noted.  Eyes: Conjunctivae are normal. PERRL. Head: Atraumatic. Nose: No congestion/rhinnorhea. Mouth/Throat: Mucous membranes are moist.  Neck: No stridor.   Cardiovascular: Normal rate, regular rhythm. Good peripheral circulation. Grossly normal heart sounds.   Respiratory: Normal respiratory effort.  No retractions. Lungs CTAB. Gastrointestinal: Soft and nontender. No distention.  Musculoskeletal: No lower extremity tenderness nor edema. No gross deformities of extremities. Neurologic: Dysarthria noted. Weakness in the right arm/leg 4+/5. Responds to pain/pressure throughout.  Skin:  Skin is warm, dry and intact. No rash noted.   ____________________________________________   LABS (all labs ordered are listed, but only abnormal results are displayed)  Labs Reviewed  PROTIME-INR - Abnormal; Notable for the following components:       Result Value   Prothrombin Time 19.0 (*)    INR 1.6 (*)    All other components within normal limits  CBC - Abnormal; Notable for the following components:   Hemoglobin 11.4 (*)    MCV 79.9 (*)    MCH 24.4 (*)    RDW 15.8 (*)    All other components within normal limits  COMPREHENSIVE METABOLIC PANEL - Abnormal; Notable for the following components:   Sodium 131 (*)    Chloride 97 (*)    Glucose, Bld 498 (*)    Creatinine, Ser 1.85 (*)    Calcium 8.2 (*)    Albumin 3.3 (*)    GFR, Estimated 32 (*)    All other components within normal limits  URINALYSIS, ROUTINE W REFLEX MICROSCOPIC - Abnormal; Notable for the following components:  Color, Urine STRAW (*)    Glucose, UA >=500 (*)    Leukocytes,Ua MODERATE (*)    Bacteria, UA RARE (*)    All other components within normal limits  BASIC METABOLIC PANEL - Abnormal; Notable for the following components:   Glucose, Bld 218 (*)    Creatinine, Ser 1.48 (*)    Calcium 8.6 (*)    GFR, Estimated 42 (*)    All other components within normal limits  CBC - Abnormal; Notable for the following components:   Hemoglobin 11.0 (*)    MCH 24.4 (*)    RDW 16.0 (*)    All other components within normal limits  GLUCOSE, CAPILLARY - Abnormal; Notable for the following components:   Glucose-Capillary 272 (*)    All other components within normal limits  I-STAT CHEM 8, ED - Abnormal; Notable for the following components:   Creatinine, Ser 1.80 (*)    Glucose, Bld 462 (*)    All other components within normal limits  CBG MONITORING, ED - Abnormal; Notable for the following components:   Glucose-Capillary 479 (*)    All other components within normal limits  RESP PANEL BY RT-PCR (FLU A&B, COVID) [id number]  URINE CULTURE  ETHANOL  APTT  DIFFERENTIAL  RAPID URINE DRUG SCREEN, HOSP PERFORMED  MAGNESIUM   ____________________________________________  EKG   EKG Interpretation  Date/Time:  Sunday Dec 31 2020 14:00:09 EDT Ventricular Rate:   98 PR Interval:    QRS Duration: 118 QT Interval:  391 QTC Calculation: 500 R Axis:   71 Text Interpretation: Atrial fibrillation Anterior infarct, old No significant change from Feb 2022 tracing Confirmed by Nanda Quinton [phone]) on 12/31/2020 2:04:46 PM       ____________________________________________  RADIOLOGY  DG Chest 2 View  Result Date: 12/31/2020 CLINICAL DATA:  55 year old female with cough and shortness of breath. EXAM: CHEST - 2 VIEW COMPARISON:  Chest radiograph dated 09/07/2020. FINDINGS: There is cardiomegaly with mild vascular congestion. No focal consolidation, pleural effusion, or pneumothorax. No acute osseous pathology. IMPRESSION: Cardiomegaly with mild vascular congestion. Electronically Signed   By: Anner Crete M.D.   On: 12/31/2020 19:07   CT HEAD CODE STROKE WO CONTRAST  Result Date: 12/31/2020 CLINICAL DATA:  Code stroke.  Neuro deficit, acute stroke suspected. EXAM: CT HEAD WITHOUT CONTRAST TECHNIQUE: Contiguous axial images were obtained from the base of the skull through the vertex without intravenous contrast. COMPARISON:  April levin 2022. FINDINGS: Brain: No evidence of acute large vascular territory infarct, acute hemorrhage, hydrocephalus, mass lesion or sizeable extra-axial fluid collection. Similar appearance of a large left MCA territory remote infarct. Additional small right posterior temporal lobe infarct was better characterized on prior MRI. Vascular: No hyperdense vessel identified. Skull: No acute fracture. Sinuses/Orbits: Mild ethmoid air cell mucosal thickening without air-fluid levels. Other: No mastoid effusions. ASPECTS [facility] Stroke Program Early CT Score) total score (0-10 with 10 being normal): 10. IMPRESSION: 1. No evidence of acute large vascular territory infarct or acute hemorrhage. ASPECTS is 10 2. Similar appearance of a large left MCA territory remote infarct. An MRI could better evaluate for acute on chronic peri-infarct ischemia  if clinically indicated. Code stroke imaging results were communicated on 12/31/2020 at 2:20 pm to provider Dr. Laverta Baltimore Via telephone, who verbally acknowledged these results. Electronically Signed   By: Margaretha Sheffield MD   On: 12/31/2020 14:22    ____________________________________________   PROCEDURES  Procedure(s) performed:   Procedures  CRITICAL CARE Performed by: Margette Fast  Total critical care time: 35 minutes Critical care time was exclusive of separately billable procedures and treating other patients. Critical care was necessary to treat or prevent imminent or life-threatening deterioration. Critical care was time spent personally by me on the following activities: development of treatment plan with patient and/or surrogate as well as nursing, discussions with consultants, evaluation of patient's response to treatment, examination of patient, obtaining history from patient or surrogate, ordering and performing treatments and interventions, ordering and review of laboratory studies, ordering and review of radiographic studies, pulse oximetry and re-evaluation of patient's condition.  Nanda Quinton, MD Emergency Medicine  ____________________________________________   INITIAL IMPRESSION / ASSESSMENT AND PLAN / ED COURSE  Pertinent labs & imaging results that were available during my care of the patient were reviewed by me and considered in my medical decision making (see chart for details).   Patient presents to the emergency department by EMS with acute onset mental status change.  She has dysarthria and some right-sided weakness on my initial assessment.  Activated code stroke based on this.  After my evaluation I came to review the patient's chart and it does appear that she has had a prior stroke with history of multiple aneurysms.  She is on Eliquis for A. Fib.  She has dysarthria and right-sided weakness documented in prior notes.  Mental status does appear acutely changed.   Unclear the degree to which this right side weakness is new versus old.  Will send the patient for urgent CT and have neurology evaluate but in light of this does not seem like she will be a tPA candidate.  Will need evaluation to see if she has aneurysmal rupture given her history.  Question recrudescence of old infarct area versus intracranial hemorrhage.  Could be toxic/metabolic.  Blood sugar also very high.   02:22 PM  Spoke with radiology regarding the head CT.  There is chronic findings but no evidence of acute hemorrhage or obvious stroke.  Discussed the case with Dr. Quinn Axe with neurology by phone.  She reviewed the CT imaging and history with me by phone.  Patient would not be a tPA candidate given her anticoagulation and wishes to cancel the code stroke.  She will leave a brief note in the chart but did not evaluate the patient on video.   CT imaging and labs reviewed. Sister at bedside who reports patient's mental status is still not at baseline. Plan for admit for MRI and ? Post-ictal. Consider EEG? No clinical status at this time.   03:42 PM  Discussed patient's case with [facility] to request admission. Patient and family (if present) updated with plan. Care transferred to [facility] service.  I reviewed all nursing notes, vitals, pertinent old records, EKGs, labs, imaging (as available).  ____________________________________________  FINAL CLINICAL IMPRESSION(S) / ED DIAGNOSES  Final diagnoses:  Disorientation     MEDICATIONS GIVEN DURING THIS VISIT:  Medications  insulin aspart (novoLOG) injection 0-15 Units (3 Units Subcutaneous Given 01/01/21 0412)  cefTRIAXone (ROCEPHIN) 1 g in sodium chloride 0.9 % 100 mL IVPB (1 g Intravenous New Bag/Given 12/31/20 1807)  0.9 % NaCl with KCl 20 mEq/ L  infusion ( Intravenous New Bag/Given 12/31/20 1805)  acetaminophen (TYLENOL) tablet 650 mg (has no administration in time range)    Or  acetaminophen (TYLENOL) suppository 650 mg (has no  administration in time range)  polyethylene glycol (MIRALAX / GLYCOLAX) packet 17 g (has no administration in time range)  ipratropium-albuterol (DUONEB) 0.5-2.5 (3) MG/3ML nebulizer solution  3 mL (has no administration in time range)  ipratropium-albuterol (DUONEB) 0.5-2.5 (3) MG/3ML nebulizer solution 3 mL (3 mLs Nebulization Not Given 01/01/21 0200)  apixaban (ELIQUIS) tablet 5 mg (5 mg Oral Given 12/31/20 2132)  rosuvastatin (CRESTOR) tablet 20 mg (20 mg Oral Patient Refused/Not Given 12/31/20 2134)  insulin glargine (LANTUS) injection 20 Units (20 Units Subcutaneous Given 12/31/20 2230)  morphine 2 MG/ML injection 2 mg (2 mg Intravenous Given 12/31/20 1745)  sodium chloride 0.9 % bolus 500 mL (500 mLs Intravenous New Bag/Given 12/31/20 2134)    Note:  This document was prepared using Dragon voice recognition software and may include unintentional dictation errors.  Nanda Quinton, MD, [facility] Emergency Medicine    Khai Arrona, [facility], MD 01/01/21 [phone]

## 2020-12-31 NOTE — ED Triage Notes (Signed)
Pt brought in by [facility] for c/o altered loc; pt was at friends house when she became not responding to family; ems states she does respond with them some; pt cbg reading over 600; last know normal at 1pm

## 2020-12-31 NOTE — H&P (Addendum)
History and Physical    Nyoka Alcoser Hohler PTW:[id number] DOB: [birth year] DOA: 12/31/2020  PCP: Inda Coke, PA   Patient coming from: Home  I have personally briefly reviewed patient's old medical records in [place]  Chief Complaint: Change in mental status  HPI: Kimiye Strathman Guinn is a 55 y.o. female with medical history significant for  Stroke with residual deficits, COPD, diabetes mellitus, congestive heart failure, hypertension, atrial fibrillation, CKD 3.   Patient was brought to the ED via EMS with reports of change in mental status.  At the time of my evaluation, patient is back to baseline, and able to give brief answers to simple questions.  Patient's sister is at bedside, and she helps with the history. Earlier today, patient was with family-her aunt Edmonia Lynch when suddenly she was " out of it" was not responding to family.  This lasted until she was brought to the ED. Per EMS she was responding some, and blood sugars was over 600.  Patient has chronic right-sided weakness, and expressive aphasia with dysarthria, these are unchanged. Patient tells me she has pain with urination, lower abdominal pain over the past few days and acute on chronic back pains.  She reports compliance with her insulins. Patient also reported some difficulty breathing, and cough both of which are new.  ED Course: Temperature 97.7.  Initially tachycardic to 121, blood pressure systolic 700F to 749S, respiratory rate 15-20, O2 sats > 98% on room air.  Code stroke initially called was canceled after establishing deficits were chronic.  Head CT shows similar appearance of large left MCA territory remote infarct.  On-call neurologist Dr. Quinn Axe consulted, patient will be admitted for AMS, if neurologic concerns persist, in-house neurology can be consulted.  Review of Systems: As per HPI all other systems reviewed and negative.  Past Medical History:  Diagnosis Date  . A-fib ([place])   . Abdominal aortic  aneurysm (AAA) ([place])   . Allergy   . Anemia   . Arthritis   . Atrial fibrillation ([place])   . CHF (congestive heart failure) ([place])    a. EF at 30-35% by echo in 08/2018 b. EF at 35% by repeat echo in 05/2019  . Chronic abdominal pain   . Chronic atrial fibrillation ([place])   . Cocaine abuse ([place])   . COPD (chronic obstructive pulmonary disease) ([place])   . Diabetes mellitus without complication ([place])   . Essential hypertension, benign   . Expressive aphasia   . Expressive aphasia    post CVA  . Fatty liver   . GERD (gastroesophageal reflux disease)   . Gout 2016  . Normal coronary arteries    3/10 - following abnormal Myoview  . Ovarian cyst   . Stroke ([place]) 12/26/2019   Right sided weakness, and expressive aphasia  . Stroke ([place])   . Thoracic ascending aortic aneurysm (HCC)    4 cm 10/31/19 CTA  . Type 2 diabetes mellitus ([place])    type II    Past Surgical History:  Procedure Laterality Date  . ABDOMINAL HYSTERECTOMY  09/10/2011   Procedure: HYSTERECTOMY ABDOMINAL;  Surgeon: Jonnie Kind, MD;  Location: [facility];  Service: Gynecology;  Laterality: N/A;  Abdominal hysterectomy  . CESAREAN SECTION  [id number], and 1994  . CHOLECYSTECTOMY  1995  . IR 3D INDEPENDENT WKST  03/16/2020  . IR ANGIO INTRA EXTRACRAN SEL INTERNAL CAROTID BILAT MOD SED  03/16/2020  . IR ANGIO VERTEBRAL SEL SUBCLAVIAN INNOMINATE UNI L MOD SED  03/16/2020  .  IR ANGIO VERTEBRAL SEL VERTEBRAL UNI R MOD SED  03/16/2020  . IR CT HEAD LTD  12/27/2019  . IR PERCUTANEOUS ART THROMBECTOMY/INFUSION INTRACRANIAL INC DIAG ANGIO  12/27/2019  . IR US GUIDE VASC ACCESS RIGHT  12/27/2019  . IR US GUIDE VASC ACCESS RIGHT  03/16/2020  . RADIOLOGY WITH ANESTHESIA N/A 12/27/2019   Procedure: IR WITH ANESTHESIA;  Surgeon: Luanne Bras, MD;  Location: [facility];  Service: Radiology;  Laterality: N/A;  . SCAR REVISION  09/10/2011   Procedure: SCAR REVISION;  Surgeon: Jonnie Kind, MD;  Location: [facility];  Service: Gynecology;   Laterality: N/A;  Wide Excision of old Cicatrix  . TUBAL LIGATION  1994     reports that she quit smoking about 12 months ago. Her smoking use included cigarettes. She quit after 29.00 years of use. She has never used smokeless tobacco. She reports that she does not drink alcohol and does not use drugs.  Allergies  Allergen Reactions  . Bee Venom Shortness Of Breath and Swelling    Bodily Swelling  . Losartan Other (See Comments)    Nosebleeds per patient report.   . Naproxen Other (See Comments)    Acid reflux  . Penicillins Nausea Only    Has patient had a PCN reaction causing immediate rash, facial/tongue/throat swelling, SOB or lightheadedness with hypotension: no Has patient had a PCN reaction causing severe rash involving mucus membranes or skin necrosis: no Has patient had a PCN reaction that required hospitalization no Has patient had a PCN reaction occurring within the last 10 years: no If all of the above answers are "NO", then may proceed with Cephalosporin   . Lisinopril     Sinus congestion    Family History  Problem Relation Age of Onset  . Cirrhosis Mother   . Early death Mother   . Alcohol abuse Mother   . Diabetes type II Father   . Alcohol abuse Father   . Diabetes type II Sister   . Early death Brother   . Alcohol abuse Son   . Anesthesia problems Neg Hx   . Hypotension Neg Hx   . Malignant hyperthermia Neg Hx   . Pseudochol deficiency Neg Hx   . Colon cancer Neg Hx   . Colon polyps Neg Hx   . Esophageal cancer Neg Hx   . Rectal cancer Neg Hx   . Stomach cancer Neg Hx     Prior to Admission medications   Medication Sig Start Date End Date Taking? Authorizing Provider  acetaminophen (TYLENOL) 325 MG tablet Take 650 mg by mouth every 8 (eight) hours as needed for mild pain.    [provider]  apixaban (ELIQUIS) 5 MG TABS tablet Take 1 tablet (5 mg total) by mouth 2 (two) times daily. 07/19/20   Strader, Fransisco Hertz, PA-C  aspirin EC 81 MG  tablet Take 1 tablet (81 mg total) by mouth daily. Swallow whole. 07/19/20   Strader, Fransisco Hertz, PA-C  BD PEN NEEDLE NANO U/F 32G X 4 MM MISC Inject into the skin as directed. 02/01/20   [provider]  carvedilol (COREG) 25 MG tablet Take 1 tablet (25 mg total) by mouth 2 (two) times daily with a meal. 11/30/20 12/30/20  Vivi Barrack, MD  citalopram (CELEXA) 10 MG tablet Take 1 tablet by mouth once daily 09/18/20   Inda Coke, PA  furosemide (LASIX) 20 MG tablet Take 1 tablet (20 mg total) by mouth daily. 07/10/20 07/10/21  Danford,  Suann Larry, MD  gabapentin (NEURONTIN) 100 MG capsule Take 1 capsule (100 mg total) by mouth 3 (three) times daily. 02/16/20   Inda Coke, PA  hydrALAZINE (APRESOLINE) 25 MG tablet Take 1 tablet (25 mg total) by mouth 3 (three) times daily. 02/29/20 02/28/21  Inda Coke, PA  insulin glargine (LANTUS) 100 UNIT/ML injection Inject 10-14 Units into the skin at bedtime. If over 250 give another 2 units    [provider]  isosorbide mononitrate (IMDUR) 30 MG 24 hr tablet Take 2 tablets (60 mg total) by mouth daily. 02/29/20 02/28/21  Inda Coke, PA  lidocaine (LIDODERM) 5 % Place 1 patch onto the skin daily. Remove & Discard patch within 12 hours or as directed by MD 11/13/20   Malvin Johns, MD  nitroGLYCERIN (NITROSTAT) 0.4 MG SL tablet Place 1 tablet (0.4 mg total) under the tongue every 5 (five) minutes as needed for chest pain. 02/01/20   Angiulli, Lavon Paganini, PA-C  pantoprazole (PROTONIX) 40 MG tablet Take 1 tablet by mouth once daily 12/14/20   Vivi Barrack, MD  rosuvastatin (CRESTOR) 20 MG tablet Take 1 tablet by mouth once daily 09/18/20   Inda Coke, [place]    Physical Exam: Vitals:   12/31/20 1355 12/31/20 1400 12/31/20 1415 12/31/20 1430  BP:  (!) 149/100 138/89 136/78  Pulse:  (!) 121 (!) 104 (!) 102  Resp:  18 17 17   SpO2:  98% 100% 100%  Weight: 96.6 kg     Height: 5\' 7"  (1.702 m)       Constitutional: NAD,  calm, comfortable Vitals:   12/31/20 1355 12/31/20 1400 12/31/20 1415 12/31/20 1430  BP:  (!) 149/100 138/89 136/78  Pulse:  (!) 121 (!) 104 (!) 102  Resp:  18 17 17   SpO2:  98% 100% 100%  Weight: 96.6 kg     Height: 5\' 7"  (1.702 m)      Eyes: PERRL, lids and conjunctivae normal ENMT: Mucous membranes are moist. Neck: normal, supple, no masses, no thyromegaly Respiratory: Upper airway transmitted sounds, possible rhonchi, no crackles. Normal respiratory effort. No accessory muscle use.  Cardiovascular: Tachycardiac, irregular rate and rhythm, no murmurs / rubs / gallops. No extremity edema. 2+ pedal pulses.  Abdomen: mild Lower abdominal tenderness, no masses palpated. No hepatosplenomegaly. Bowel sounds positive.  Musculoskeletal: no clubbing / cyanosis. No joint deformity upper and lower extremities. Good ROM, no contractures. Normal muscle tone.  Tenderness to palpation lower back Skin: no rashes, lesions, ulcers. No induration Neurologic: No facial asymmetry, slight dysarthria, speaks in short simple phrases and speech is understandable.  Reduced sensation right lower extremity, 4/5 strength right upper and lower extremity, 5/5 strength left upper and lower extremity. Psychiatric: Normal judgment and insight. Alert and oriented x 3. Normal mood.   Labs on Admission: I have personally reviewed following labs and imaging studies  CBC: Recent Labs  Lab 12/31/20 1357 12/31/20 1440  WBC 6.7  --   NEUTROABS 3.7  --   HGB 11.4* 12.6  HCT 37.3 37.0  MCV 79.9*  --   PLT 244  --    Basic Metabolic Panel: Recent Labs  Lab 12/31/20 1357 12/31/20 1440  NA 131* 136  K 3.8 4.0  CL 97* 98  CO2 25  --   GLUCOSE 498* 462*  BUN 20 20  CREATININE 1.85* 1.80*  CALCIUM 8.2*  --    Liver Function Tests: Recent Labs  Lab 12/31/20 1357  AST 16  ALT 13  ALKPHOS  115  BILITOT 0.5  PROT 7.1  ALBUMIN 3.3*   Coagulation Profile: Recent Labs  Lab 12/31/20 1357  INR 1.6*    CBG: Recent Labs  Lab 12/31/20 1358  GLUCAP 479*   Urine analysis:    Component Value Date/Time   COLORURINE STRAW (A) 12/31/2020 1357   APPEARANCEUR CLEAR 12/31/2020 1357   LABSPEC 1.016 12/31/2020 1357   PHURINE 6.0 12/31/2020 1357   GLUCOSEU >=500 (A) 12/31/2020 1357   GLUCOSEU >=1000 (A) 09/21/2018 0829   HGBUR NEGATIVE 12/31/2020 1357   BILIRUBINUR NEGATIVE 12/31/2020 1357   KETONESUR NEGATIVE 12/31/2020 1357   PROTEINUR NEGATIVE 12/31/2020 1357   UROBILINOGEN 0.2 09/21/2018 0829   NITRITE NEGATIVE 12/31/2020 1357   LEUKOCYTESUR MODERATE (A) 12/31/2020 1357    Radiological Exams on Admission: CT HEAD CODE STROKE WO CONTRAST  Result Date: 12/31/2020 CLINICAL DATA:  Code stroke.  Neuro deficit, acute stroke suspected. EXAM: CT HEAD WITHOUT CONTRAST TECHNIQUE: Contiguous axial images were obtained from the base of the skull through the vertex without intravenous contrast. COMPARISON:  April levin 2022. FINDINGS: Brain: No evidence of acute large vascular territory infarct, acute hemorrhage, hydrocephalus, mass lesion or sizeable extra-axial fluid collection. Similar appearance of a large left MCA territory remote infarct. Additional small right posterior temporal lobe infarct was better characterized on prior MRI. Vascular: No hyperdense vessel identified. Skull: No acute fracture. Sinuses/Orbits: Mild ethmoid air cell mucosal thickening without air-fluid levels. Other: No mastoid effusions. ASPECTS [facility] Stroke Program Early CT Score) total score (0-10 with 10 being normal): 10. IMPRESSION: 1. No evidence of acute large vascular territory infarct or acute hemorrhage. ASPECTS is 10 2. Similar appearance of a large left MCA territory remote infarct. An MRI could better evaluate for acute on chronic peri-infarct ischemia if clinically indicated. Code stroke imaging results were communicated on 12/31/2020 at 2:20 pm to provider Dr. Laverta Baltimore Via telephone, who verbally acknowledged these  results. Electronically Signed   By: Margaretha Sheffield MD   On: 12/31/2020 14:22    EKG: Independently reviewed.  Atrial fibrillation rate 98, QTc prolonged 500.  No significant change from prior.  Assessment/Plan Principal Problem:   Acute metabolic encephalopathy Active Problems:   COPD (chronic obstructive pulmonary disease) (HCC)   Type 2 diabetes mellitus with diabetic autonomic neuropathy, with long-term current use of insulin (HCC)   Essential hypertension   Chronic diastolic congestive heart failure (HCC)   Prolonged QT interval   Atrial fibrillation, chronic (HCC)   Acute metabolic encephalopathy-likely secondary to UTI and dehydration hyperglycemia initial blood sugar reported in the 600s by EMS, currently in the 400s.  Head CT shows stable large left MCA territory remote infarct.  UA moderate leukocytes rare bacteria. -IV ceftriaxone - 520ml bolus, continue N/s + 20 KCL 100cc/hr x 20 hrs -If recurrence or worsening of mental status, may need MRI brain  Probable urinary tract infection-rules out for sepsis.  Afebrile without leukocytosis.  WBC 6.7.  She is tachycardic but likely from dehydration.  Cultures from 01/23/2020 grew Klebsiella and E. Coli- pansensitive. -IV ceftriaxone -Urine cultures  Type 2 diabetes mellitus with hyperglycemia-blood sugars currently 498, compliance with insulin Lantus 10- 14 u daily.  Not in DKA, anion gap 9, serum bicarb 25. - HgbA1c - SSi- M q4h - Resume lantus at increased dose 20 units nightly -May need dose adjustments on discharge  COPD-appears comfortable, mostly upper airway transmitted sounds on exam.  Reports cough.  On room air sats greater than 98%. - Obtain 2 view chest  x-ray -DuoNebs as needed and scheduled  CVA-with chronic right-sided deficits, dysarthria.  Head CT shows stable large left MCA remote infarct. -Resume Eliquis, aspirin, Crestor  Prolonged QTC-500, new.  Home medications include Celexa. -Check  magnesium  Chronic diastolic CHF-stable and compensated.  Requiring hydration at this time.  LAst Echo - 07/2020 EF 50 to 55%, normal LV function. -Hold Lasix 20 mg daily for now  Hypertension-initially stable, but now systolic 138 -Unable to confirm home medications, med reconciliation pending  Atrial fibrillation on chronic anticoagulation heart rates up to 120s. -Resume Eliquis,  -Med reconciliation pending  DVT prophylaxis: Eliquis Code Status: Full code Family Communication: Sister at bedside  disposition Plan: ~ 1 - 2 days Consults called: None  Admission status: Obs Tele    Bethena Roys MD [facility]  12/31/2020, 6:34 PM

## 2021-01-01 DIAGNOSIS — J449 Chronic obstructive pulmonary disease, unspecified: Secondary | ICD-10-CM | POA: Diagnosis not present

## 2021-01-01 DIAGNOSIS — I5032 Chronic diastolic (congestive) heart failure: Secondary | ICD-10-CM | POA: Diagnosis not present

## 2021-01-01 DIAGNOSIS — I482 Chronic atrial fibrillation, unspecified: Secondary | ICD-10-CM | POA: Diagnosis not present

## 2021-01-01 DIAGNOSIS — I10 Essential (primary) hypertension: Secondary | ICD-10-CM

## 2021-01-01 DIAGNOSIS — G9341 Metabolic encephalopathy: Secondary | ICD-10-CM | POA: Diagnosis not present

## 2021-01-01 DIAGNOSIS — Z794 Long term (current) use of insulin: Secondary | ICD-10-CM

## 2021-01-01 DIAGNOSIS — E1143 Type 2 diabetes mellitus with diabetic autonomic (poly)neuropathy: Secondary | ICD-10-CM

## 2021-01-01 LAB — BASIC METABOLIC PANEL
Anion gap: 8 (ref 5–15)
BUN: 15 mg/dL (ref 6–20)
CO2: 24 mmol/L (ref 22–32)
Calcium: 8.6 mg/dL — ABNORMAL LOW (ref 8.9–10.3)
Chloride: 105 mmol/L (ref 98–111)
Creatinine, Ser: 1.48 mg/dL — ABNORMAL HIGH (ref 0.44–1.00)
GFR, Estimated: 42 mL/min — ABNORMAL LOW (ref >60)
Glucose, Bld: 218 mg/dL — ABNORMAL HIGH (ref 70–99)
Potassium: 3.9 mmol/L (ref 3.5–5.1)
Sodium: 137 mmol/L (ref 135–145)

## 2021-01-01 LAB — CBC
HCT: 36.1 % (ref 36.0–46.0)
Hemoglobin: 11 g/dL — ABNORMAL LOW (ref 12.0–15.0)
MCH: 24.4 pg — ABNORMAL LOW (ref 26.0–34.0)
MCHC: 30.5 g/dL (ref 30.0–36.0)
MCV: 80 fL (ref 80.0–100.0)
Platelets: 257 10*3/uL (ref 150–400)
RBC: 4.51 MIL/uL (ref 3.87–5.11)
RDW: 16 % — ABNORMAL HIGH (ref 11.5–15.5)
WBC: 6.7 10*3/uL (ref 4.0–10.5)
nRBC: 0 % (ref 0.0–0.2)

## 2021-01-01 LAB — GLUCOSE, RANDOM: Glucose, Bld: 333 mg/dL — ABNORMAL HIGH (ref 70–99)

## 2021-01-01 LAB — GLUCOSE, CAPILLARY
Glucose-Capillary: 191 mg/dL — ABNORMAL HIGH (ref 70–99)
Glucose-Capillary: 292 mg/dL — ABNORMAL HIGH (ref 70–99)
Glucose-Capillary: 245 mg/dL — ABNORMAL HIGH (ref 70–99)
Glucose-Capillary: 165 mg/dL — ABNORMAL HIGH (ref 70–99)
Glucose-Capillary: 193 mg/dL — ABNORMAL HIGH (ref 70–99)
Glucose-Capillary: 200 mg/dL — ABNORMAL HIGH (ref 70–99)
Glucose-Capillary: 326 mg/dL — ABNORMAL HIGH (ref 70–99)
Glucose-Capillary: 404 mg/dL — ABNORMAL HIGH (ref 70–99)

## 2021-01-01 MED ORDER — HYDRALAZINE HCL 25 MG PO TABS
25.0000 mg | ORAL_TABLET | Freq: Three times a day (TID) | ORAL | Status: DC
  Administered 2021-01-02: 25 mg via ORAL
  Filled 2021-01-01: qty 1

## 2021-01-01 MED ORDER — PANTOPRAZOLE SODIUM 40 MG PO TBEC
40.0000 mg | DELAYED_RELEASE_TABLET | Freq: Every day | ORAL | Status: DC
  Administered 2021-01-01 – 2021-01-02 (×2): 40 mg via ORAL
  Filled 2021-01-01 (×2): qty 1

## 2021-01-01 MED ORDER — ISOSORBIDE MONONITRATE ER 60 MG PO TB24
60.0000 mg | ORAL_TABLET | Freq: Two times a day (BID) | ORAL | Status: DC
  Administered 2021-01-01 – 2021-01-02 (×2): 60 mg via ORAL
  Filled 2021-01-01 (×2): qty 1

## 2021-01-01 MED ORDER — INSULIN ASPART 100 UNIT/ML IJ SOLN
0.0000 [IU] | INTRAMUSCULAR | Status: DC
Start: 2021-01-01 — End: 2021-01-02
  Administered 2021-01-01: 3 [IU] via SUBCUTANEOUS
  Administered 2021-01-01: 15 [IU] via SUBCUTANEOUS
  Administered 2021-01-01: 5 [IU] via SUBCUTANEOUS
  Administered 2021-01-02: 3 [IU] via SUBCUTANEOUS
  Administered 2021-01-02: 5 [IU] via SUBCUTANEOUS
  Administered 2021-01-02: 8 [IU] via SUBCUTANEOUS

## 2021-01-01 MED ORDER — FUROSEMIDE 20 MG PO TABS
20.0000 mg | ORAL_TABLET | Freq: Every day | ORAL | Status: DC
  Administered 2021-01-01 – 2021-01-02 (×2): 20 mg via ORAL
  Filled 2021-01-01 (×2): qty 1

## 2021-01-01 MED ORDER — GABAPENTIN 100 MG PO CAPS
100.0000 mg | ORAL_CAPSULE | Freq: Three times a day (TID) | ORAL | Status: DC
  Administered 2021-01-01 – 2021-01-02 (×3): 100 mg via ORAL
  Filled 2021-01-01 (×3): qty 1

## 2021-01-01 MED ORDER — CARVEDILOL 12.5 MG PO TABS
50.0000 mg | ORAL_TABLET | Freq: Two times a day (BID) | ORAL | Status: DC
  Administered 2021-01-01 – 2021-01-02 (×2): 50 mg via ORAL
  Filled 2021-01-01 (×2): qty 4

## 2021-01-01 MED ORDER — ASPIRIN EC 81 MG PO TBEC
81.0000 mg | DELAYED_RELEASE_TABLET | Freq: Every day | ORAL | Status: DC
  Administered 2021-01-01 – 2021-01-02 (×2): 81 mg via ORAL
  Filled 2021-01-01 (×2): qty 1

## 2021-01-01 MED ORDER — POTASSIUM CHLORIDE IN NACL 20-0.9 MEQ/L-% IV SOLN: INTRAVENOUS | Status: DC

## 2021-01-01 MED ORDER — INSULIN GLARGINE 100 UNIT/ML ~~LOC~~ SOLN
30.0000 [IU] | Freq: Every day | SUBCUTANEOUS | Status: DC
  Administered 2021-01-01: 30 [IU] via SUBCUTANEOUS
  Filled 2021-01-01: qty 0.3

## 2021-01-01 MED ORDER — NITROGLYCERIN 0.4 MG SL SUBL: 0.4000 mg | SUBLINGUAL_TABLET | SUBLINGUAL | Status: DC | PRN

## 2021-01-01 NOTE — Progress Notes (Signed)
Patients blood sugar 404. MD notified.

## 2021-01-01 NOTE — Progress Notes (Signed)
PROGRESS NOTE   Robin [name]  HQP:[id number] DOB: Jun 13, 1966 DOA: 12/31/2020 PCP: Inda Coke, PA   Chief Complaint  Patient presents with  . Altered Mental Status   Level of care: Telemetry  Brief Admission History:  55 y.o. female with medical history significant for  Stroke with residual deficits, COPD, diabetes mellitus, congestive heart failure, hypertension, atrial fibrillation, CKD 3.    Patient was brought to the ED via EMS with reports of change in mental status.  At the time of my evaluation, patient is back to baseline, and able to give brief answers to simple questions.  Patient's sister is at bedside, and she helps with the history.  Assessment & Plan:   Principal Problem:   Acute metabolic encephalopathy Active Problems:   COPD (chronic obstructive pulmonary disease) (HCC)   Type 2 diabetes mellitus with diabetic autonomic neuropathy, with long-term current use of insulin (HCC)   Essential hypertension   Chronic diastolic congestive heart failure (HCC)   Prolonged QT interval   Atrial fibrillation, chronic (HCC)   Hyperglycemia   Acute metabolic encephalopathy-improving with treatments continue supportive therapy with IV fluids and IV antibiotics to treat UTI.  Chest pain-EKG with findings of A. fib with RVR-restarted home metoprolol 50 mg twice daily.   COPD - stable continuing home medications.   Essential hypertension -we have restarted all of her home blood pressure medications.  Chronic diastolic heart failure- stable and compensated.  Resume home medications.  Type 2 diabetes mellitus with hyperglycemia-poorly controlled-increase Lantus to 40 units and continue CBG monitoring and SSI coverage as ordered.   A. fib with RVR- resume metoprolol 50 mg twice daily and apixaban for full anticoagulation.    DVT prophylaxis: apixaban  Code Status: full  Disposition: home  Status is: Observation  The patient remains OBS appropriate and will d/c  before 2 midnights.  Dispo: The patient is from: Home              Anticipated d/c is to: Home              Patient currently is not medically stable to d/c.   Difficult to place patient No   Consultants:     Procedures:     Antimicrobials:     Subjective: Pt started having chest pain this evening.  She has some palpitations as well.    Objective: Vitals:   12/31/20 2300 01/01/21 0300 01/01/21 1407 01/01/21 1436  BP: 135/82 126/80 (!) 152/96 (!) 153/108  Pulse: 90 98 100 (!) 105  Resp: 18 19 20    Temp: 98.5 F (36.9 C) 98.4 F (36.9 C) 99.4 F (37.4 C)   TempSrc: Oral Oral Oral   SpO2: 98% 99% 100% 100%  Weight:      Height:        Intake/Output Summary (Last 24 hours) at 01/01/2021 1500 Last data filed at 01/01/2021 1410 Gross per 24 hour  Intake 1523.09 ml  Output 1350 ml  Net 173.09 ml   Filed Weights   12/31/20 1355 12/31/20 1712  Weight: 96.6 kg 99.7 kg    Examination:  General exam: Chronically ill-appearing female, appears calm and comfortable  Respiratory system: Clear to auscultation. Respiratory effort normal. Cardiovascular system: normal S1 & S2 heard, tachycardic rate. No JVD, murmurs, rubs, gallops or clicks. No pedal edema. Gastrointestinal system: Abdomen is nondistended, soft and nontender. No organomegaly or masses felt. Normal bowel sounds heard. Central nervous system: Alert and oriented. No focal neurological deficits. Extremities: Symmetric 5  x 5 power. Skin: No rashes, lesions or ulcers Psychiatry: Judgement and insight appear poor. Mood & affect appropriate.   Data Reviewed: I have personally reviewed following labs and imaging studies  CBC: Recent Labs  Lab 12/31/20 1357 12/31/20 1440 01/01/21 0552  WBC 6.7  --  6.7  NEUTROABS 3.7  --   --   HGB 11.4* 12.6 11.0*  HCT 37.3 37.0 36.1  MCV 79.9*  --  80.0  PLT 244  --  301    Basic Metabolic Panel: Recent Labs  Lab 12/31/20 1357 12/31/20 1440 01/01/21 0552  NA 131*  136 137  K 3.8 4.0 3.9  CL 97* 98 105  CO2 25  --  24  GLUCOSE 498* 462* 218*  BUN 20 20 15   CREATININE 1.85* 1.80* 1.48*  CALCIUM 8.2*  --  8.6*  MG 1.8  --   --     GFR: Estimated Creatinine Clearance: 52.7 mL/min (A) (by C-G formula based on SCr of 1.48 mg/dL (H)).  Liver Function Tests: Recent Labs  Lab 12/31/20 1357  AST 16  ALT 13  ALKPHOS 115  BILITOT 0.5  PROT 7.1  ALBUMIN 3.3*    CBG: Recent Labs  Lab 12/31/20 1358 12/31/20 2008 01/01/21 0738 01/01/21 1010 01/01/21 1231  GLUCAP 479* 272* 191* 292* 245*    Recent Results (from the past 240 hour(s))  Resp Panel by RT-PCR (Flu A&B, Covid) Nasopharyngeal Swab     Status: None   Collection Time: 12/31/20  1:57 PM   Specimen: Nasopharyngeal Swab; Nasopharyngeal(NP) swabs in vial transport medium  Result Value Ref Range Status   SARS Coronavirus 2 by RT PCR NEGATIVE NEGATIVE Final    Comment: (NOTE) SARS-CoV-2 target nucleic acids are NOT DETECTED.  The SARS-CoV-2 RNA is generally detectable in upper respiratory specimens during the acute phase of infection. The lowest concentration of SARS-CoV-2 viral copies this assay can detect is 138 copies/mL. A negative result does not preclude SARS-Cov-2 infection and should not be used as the sole basis for treatment or other patient management decisions. A negative result may occur with  improper specimen collection/handling, submission of specimen other than nasopharyngeal swab, presence of viral mutation(s) within the areas targeted by this assay, and inadequate number of viral copies(<138 copies/mL). A negative result must be combined with clinical observations, patient history, and epidemiological information. The expected result is Negative.  Fact Sheet for Patients:  EntrepreneurPulse.com.au  Fact Sheet for Healthcare Providers:  IncredibleEmployment.be  This test is no t yet approved or cleared by the [place]  FDA and  has been authorized for detection and/or diagnosis of SARS-CoV-2 by FDA under an Emergency Use Authorization (EUA). This EUA will remain  in effect (meaning this test can be used) for the duration of the COVID-19 declaration under Section 564(b)(1) of the Act, 21 U.S.C.section 360bbb-3(b)(1), unless the authorization is terminated  or revoked sooner.       Influenza A by PCR NEGATIVE NEGATIVE Final   Influenza B by PCR NEGATIVE NEGATIVE Final    Comment: (NOTE) The Xpert Xpress SARS-CoV-2/FLU/RSV plus assay is intended as an aid in the diagnosis of influenza from Nasopharyngeal swab specimens and should not be used as a sole basis for treatment. Nasal washings and aspirates are unacceptable for Xpert Xpress SARS-CoV-2/FLU/RSV testing.  Fact Sheet for Patients: EntrepreneurPulse.com.au  Fact Sheet for Healthcare Providers: IncredibleEmployment.be  This test is not yet approved or cleared by the [place] FDA and has been authorized for detection  and/or diagnosis of SARS-CoV-2 by FDA under an Emergency Use Authorization (EUA). This EUA will remain in effect (meaning this test can be used) for the duration of the COVID-19 declaration under Section 564(b)(1) of the Act, 21 U.S.C. section 360bbb-3(b)(1), unless the authorization is terminated or revoked.  Performed at [facility], [address], [place], [place] [address]      Radiology Studies: DG Chest 2 View  Result Date: 12/31/2020 CLINICAL DATA:  55 year old female with cough and shortness of breath. EXAM: CHEST - 2 VIEW COMPARISON:  Chest radiograph dated 09/07/2020. FINDINGS: There is cardiomegaly with mild vascular congestion. No focal consolidation, pleural effusion, or pneumothorax. No acute osseous pathology. IMPRESSION: Cardiomegaly with mild vascular congestion. Electronically Signed   By: Anner Crete M.D.   On: 12/31/2020 19:07   CT HEAD CODE STROKE WO  CONTRAST  Result Date: 12/31/2020 CLINICAL DATA:  Code stroke.  Neuro deficit, acute stroke suspected. EXAM: CT HEAD WITHOUT CONTRAST TECHNIQUE: Contiguous axial images were obtained from the base of the skull through the vertex without intravenous contrast. COMPARISON:  April levin 2022. FINDINGS: Brain: No evidence of acute large vascular territory infarct, acute hemorrhage, hydrocephalus, mass lesion or sizeable extra-axial fluid collection. Similar appearance of a large left MCA territory remote infarct. Additional small right posterior temporal lobe infarct was better characterized on prior MRI. Vascular: No hyperdense vessel identified. Skull: No acute fracture. Sinuses/Orbits: Mild ethmoid air cell mucosal thickening without air-fluid levels. Other: No mastoid effusions. ASPECTS [facility] Stroke Program Early CT Score) total score (0-10 with 10 being normal): 10. IMPRESSION: 1. No evidence of acute large vascular territory infarct or acute hemorrhage. ASPECTS is 10 2. Similar appearance of a large left MCA territory remote infarct. An MRI could better evaluate for acute on chronic peri-infarct ischemia if clinically indicated. Code stroke imaging results were communicated on 12/31/2020 at 2:20 pm to provider Dr. Laverta Baltimore Via telephone, who verbally acknowledged these results. Electronically Signed   By: Margaretha Sheffield MD   On: 12/31/2020 14:22    Scheduled Meds: . apixaban  5 mg Oral BID  . insulin aspart  0-15 Units Subcutaneous Q4H  . insulin glargine  20 Units Subcutaneous QHS  . rosuvastatin  20 mg Oral q1800   Continuous Infusions: . 0.9 % NaCl with KCl 20 mEq / L 100 mL/hr at 01/01/21 1048  . cefTRIAXone (ROCEPHIN)  IV 1 g (12/31/20 1807)    LOS: 0 days   Time spent: 64 mins  Yusra Ravert Wynetta Emery, MD How to contact the [facility] Attending or Consulting provider [place] or covering provider during after hours [place], for this patient?  1. Check the care team in [facility] and look for a)  attending/consulting [facility] provider listed and b) the [facility] team listed 2. Log into [email or web address] and use [facility] universal password to access. If you do not have the password, please contact the hospital operator. 3. Locate the [facility] provider you are looking for under [facility] and page to a number that you can be directly reached. 4. If you still have difficulty reaching the provider, please page the [facility] (Director on Call) for the Hospitalists listed on amion for assistance.  01/01/2021, 3:00 PM

## 2021-01-01 NOTE — Plan of Care (Signed)

## 2021-01-02 DIAGNOSIS — I482 Chronic atrial fibrillation, unspecified: Secondary | ICD-10-CM | POA: Diagnosis not present

## 2021-01-02 DIAGNOSIS — G9341 Metabolic encephalopathy: Secondary | ICD-10-CM | POA: Diagnosis not present

## 2021-01-02 DIAGNOSIS — R9431 Abnormal electrocardiogram [ECG] [EKG]: Secondary | ICD-10-CM

## 2021-01-02 DIAGNOSIS — I5032 Chronic diastolic (congestive) heart failure: Secondary | ICD-10-CM | POA: Diagnosis not present

## 2021-01-02 DIAGNOSIS — J449 Chronic obstructive pulmonary disease, unspecified: Secondary | ICD-10-CM | POA: Diagnosis not present

## 2021-01-02 LAB — COMPREHENSIVE METABOLIC PANEL
ALT: 11 U/L (ref 0–44)
AST: 12 U/L — ABNORMAL LOW (ref 15–41)
Albumin: 3 g/dL — ABNORMAL LOW (ref 3.5–5.0)
Alkaline Phosphatase: 94 U/L (ref 38–126)
Anion gap: 7 (ref 5–15)
BUN: 20 mg/dL (ref 6–20)
CO2: 24 mmol/L (ref 22–32)
Calcium: 8.8 mg/dL — ABNORMAL LOW (ref 8.9–10.3)
Chloride: 106 mmol/L (ref 98–111)
Creatinine, Ser: 1.49 mg/dL — ABNORMAL HIGH (ref 0.44–1.00)
GFR, Estimated: 41 mL/min — ABNORMAL LOW (ref >60)
Glucose, Bld: 165 mg/dL — ABNORMAL HIGH (ref 70–99)
Potassium: 3.8 mmol/L (ref 3.5–5.1)
Sodium: 137 mmol/L (ref 135–145)
Total Bilirubin: 0.4 mg/dL (ref 0.3–1.2)
Total Protein: 6.4 g/dL — ABNORMAL LOW (ref 6.5–8.1)

## 2021-01-02 LAB — URINE CULTURE: Culture: NO GROWTH

## 2021-01-02 LAB — CBC WITH DIFFERENTIAL/PLATELET
Abs Immature Granulocytes: 0.01 10*3/uL (ref 0.00–0.07)
Basophils Absolute: 0 10*3/uL (ref 0.0–0.1)
Basophils Relative: 1 %
Eosinophils Absolute: 0.3 10*3/uL (ref 0.0–0.5)
Eosinophils Relative: 4 %
HCT: 34.9 % — ABNORMAL LOW (ref 36.0–46.0)
Hemoglobin: 10.5 g/dL — ABNORMAL LOW (ref 12.0–15.0)
Immature Granulocytes: 0 %
Lymphocytes Relative: 39 %
Lymphs Abs: 2.8 10*3/uL (ref 0.7–4.0)
MCH: 24 pg — ABNORMAL LOW (ref 26.0–34.0)
MCHC: 30.1 g/dL (ref 30.0–36.0)
MCV: 79.9 fL — ABNORMAL LOW (ref 80.0–100.0)
Monocytes Absolute: 0.7 10*3/uL (ref 0.1–1.0)
Monocytes Relative: 10 %
Neutro Abs: 3.4 10*3/uL (ref 1.7–7.7)
Neutrophils Relative %: 46 %
Platelets: 240 10*3/uL (ref 150–400)
RBC: 4.37 MIL/uL (ref 3.87–5.11)
RDW: 15.5 % (ref 11.5–15.5)
WBC: 7.2 10*3/uL (ref 4.0–10.5)
nRBC: 0 % (ref 0.0–0.2)

## 2021-01-02 LAB — GLUCOSE, CAPILLARY
Glucose-Capillary: 159 mg/dL — ABNORMAL HIGH (ref 70–99)
Glucose-Capillary: 231 mg/dL — ABNORMAL HIGH (ref 70–99)

## 2021-01-02 LAB — MAGNESIUM: Magnesium: 1.7 mg/dL (ref 1.7–2.4)

## 2021-01-02 MED ORDER — ISOSORBIDE MONONITRATE ER 30 MG PO TB24: 60.0000 mg | ORAL_TABLET | Freq: Two times a day (BID) | ORAL | Status: DC

## 2021-01-02 MED ORDER — TEMAZEPAM 15 MG PO CAPS
15.0000 mg | ORAL_CAPSULE | Freq: Every evening | ORAL | Status: DC | PRN
  Administered 2021-01-02: 15 mg via ORAL
  Filled 2021-01-02: qty 1

## 2021-01-02 MED ORDER — INSULIN ASPART 100 UNIT/ML IJ SOLN
10.0000 [IU] | Freq: Three times a day (TID) | INTRAMUSCULAR | Status: DC
  Administered 2021-01-02 (×2): 10 [IU] via SUBCUTANEOUS

## 2021-01-02 MED ORDER — CARVEDILOL 25 MG PO TABS: 50.0000 mg | ORAL_TABLET | Freq: Two times a day (BID) | ORAL | Status: DC

## 2021-01-02 MED ORDER — CEFDINIR 300 MG PO CAPS: 300.0000 mg | ORAL_CAPSULE | Freq: Every day | ORAL | 0 refills | Status: AC

## 2021-01-02 MED ORDER — INSULIN GLARGINE 100 UNIT/ML ~~LOC~~ SOLN
40.0000 [IU] | Freq: Every day | SUBCUTANEOUS | Status: DC
  Filled 2021-01-02: qty 0.4

## 2021-01-02 NOTE — Plan of Care (Addendum)
  Problem: Acute Rehab PT Goals(only PT should resolve) Goal: Patient Will Perform Sitting Balance 01/02/2021 1247 by Sinclair Ship, Student-PT Outcome: Progressing 01/02/2021 1247 by Sinclair Ship, Student-PT Outcome: Progressing Flowsheets (Taken 01/02/2021 1247) Patient will perform sitting balance: . with modified independence . 1-2 min . with no UE support Goal: Patient Will Transfer Sit To/From Stand 01/02/2021 1247 by Sinclair Ship, Student-PT Outcome: Progressing Flowsheets (Taken 01/02/2021 1247) Patient will transfer sit to/from stand: with modified independence 01/02/2021 1247 by Sinclair Ship, Student-PT Outcome: Progressing Goal: Pt Will Ambulate 01/02/2021 1247 by Sinclair Ship, Student-PT Outcome: Progressing Flowsheets (Taken 01/02/2021 1247) Pt will Ambulate: . 100 feet . with supervision . with cane 01/02/2021 1247 by Sinclair Ship, Student-PT Outcome: Progressing Goal: Pt Will Go Up/Down Stairs 01/02/2021 1247 by Sinclair Ship, Student-PT Outcome: Progressing Flowsheets (Taken 01/02/2021 1247) Pt will Go Up / Down Stairs: . 3-5 stairs . with supervision . with modified independence . with cane 01/02/2021 1247 by Sinclair Ship, Student-PT Outcome: Progressing    12:49 PM, 01/02/21 Sinclair Ship SPT  2:36 PM, 01/02/21 Lonell Grandchild, MPT Physical Therapist with [facility] 336 [phone] office [phone] mobile phone

## 2021-01-02 NOTE — Evaluation (Addendum)
Physical Therapy Evaluation Patient Details Name: Robin [name] MRN: [id number] DOB: 15-Jul-1966 Today's Date: 01/02/2021   History of Present Illness  Verena Shawgo Chinchilla is a 55 y.o. female with medical history significant for  Stroke with residual deficits, COPD, diabetes mellitus, congestive heart failure, hypertension, atrial fibrillation, CKD 3.  Patient was brought to the ED via EMS with reports of change in mental status.  At the time of my evaluation, patient is back to baseline, and able to give brief answers to simple questions.  Patient's sister is at bedside, and she helps with the history.  Earlier today, patient was with family-her aunt Edmonia Lynch when suddenly she was " out of it" was not responding to family.  This lasted until she was brought to the ED. Per EMS she was responding some, and blood sugars was over 600.  Patient has chronic right-sided weakness, and expressive aphasia with dysarthria, these are unchanged.  Patient tells me she has pain with urination, lower abdominal pain over the past few days and acute on chronic back pains.  She reports compliance with her insulins.  Patient also reported some difficulty breathing, and cough both of which are new.    Clinical Impression  Patient presents standing up and moving from the commode to the bed. Patient presents with chronic right sided weakness from previous CVA. Patient demonstrates good ambulation, with a decreased cadenced and step on the right side with a single point cane requiring a few standing breaks. Patient will benefit from continued physical therapy in hospital and recommended venue below to increase strength, balance, endurance for safe ADLs and gait.    Follow Up Recommendations Home health PT;Supervision - Intermittent;Supervision for mobility/OOB    Equipment Recommendations       Recommendations for Other Services       Precautions / Restrictions Precautions Precautions: Fall Precaution Comments: patient  has a history of a right sided CVA Restrictions Weight Bearing Restrictions: No      Mobility  Bed Mobility Overal bed mobility: Independent                  Transfers Overall transfer level: Independent Equipment used: Straight cane                Ambulation/Gait Ambulation/Gait assistance: Modified independent (Device/Increase time);Supervision;Min guard Gait Distance (Feet): 50 Feet Assistive device: Straight cane Gait Pattern/deviations: Decreased step length - right;Decreased stance time - right;Decreased stride length;Decreased weight shift to right Gait velocity: decreased   General Gait Details: Slow labored cadence; required standing breaks and verbal cues for direction  Stairs            Wheelchair Mobility    Modified Rankin (Stroke Patients Only)       Balance Overall balance assessment: Modified Independent                                           Pertinent Vitals/Pain Pain Assessment: No/denies pain    Home Living Family/patient expects to be discharged to:: Private residence Living Arrangements: Children Available Help at Discharge: Family;Available PRN/intermittently Type of Home: House Home Access: Stairs to enter Entrance Stairs-Rails: None Entrance Stairs-Number of Steps: 3-4 Home Layout: One level Home Equipment: Walker - 2 wheels;Cane - single point;Bedside commode;Shower seat Additional Comments: Information obtained for patient with help from sister due to dysarthria and expressive aphasia    Prior Function  Level of Independence: Independent with assistive device(s)         Comments: household ambulator using SPC     Hand Dominance   Dominant Hand: Left    Extremity/Trunk Assessment   Upper Extremity Assessment Upper Extremity Assessment: RUE deficits/detail;Overall [facility] for tasks assessed RUE Deficits / Details: at baseline deu past history of CVA    Lower Extremity Assessment Lower  Extremity Assessment: Overall WFL for tasks assessed;RLE deficits/detail RLE Deficits / Details: at baseline due to past history of CVA    Cervical / Trunk Assessment Cervical / Trunk Assessment: Normal  Communication   Communication: Expressive difficulties  Cognition Arousal/Alertness: Awake/alert Behavior During Therapy: WFL for tasks assessed/performed Overall Cognitive Status: Within Functional Limits for tasks assessed                                 General Comments: patient has dysarthria and expressive aphasia      General Comments      Exercises     Assessment/Plan    PT Assessment Patient needs continued PT services  PT Problem List Decreased strength;Decreased activity tolerance;Decreased balance;Decreased coordination;Decreased knowledge of precautions       PT Treatment Interventions Gait training;Functional mobility training;Therapeutic activities;Patient/family education    PT Goals (Current goals can be found in the Care Plan section)  Acute Rehab PT Goals Patient Stated Goal: return home PT Goal Formulation: With patient/family Time For Goal Achievement: 01/16/21 Potential to Achieve Goals: Good    Frequency Min 2X/week   Barriers to discharge        Co-evaluation               AM-PAC PT "6 Clicks" Mobility  Outcome Measure Help needed turning from your back to your side while in a flat bed without using bedrails?: None Help needed moving from lying on your back to sitting on the side of a flat bed without using bedrails?: None Help needed moving to and from a bed to a chair (including a wheelchair)?: None Help needed standing up from a chair using your arms (e.g., wheelchair or bedside chair)?: None Help needed to walk in hospital room?: None Help needed climbing 3-5 steps with a railing? : A Little 6 Click Score: 23    End of Session   Activity Tolerance: Patient tolerated treatment well;Patient limited by  fatigue Patient left: in chair;with call bell/phone within reach;with chair alarm set Nurse Communication: Mobility status PT Visit Diagnosis: Unsteadiness on feet (R26.81);Other abnormalities of gait and mobility (R26.89);Muscle weakness (generalized) (M62.81)    Time: 8270-7867 PT Time Calculation (min) (ACUTE ONLY): 22 min   Charges:   PT Evaluation $PT Eval Moderate Complexity: 1 Mod PT Treatments $Therapeutic Activity: 8-22 mins       2:38 PM, 01/02/21 Jeneen Rinks Cousler SPT  2:39 PM, 01/02/21 Lonell Grandchild, MPT Physical Therapist with [facility] [phone] office [phone] mobile phone

## 2021-01-02 NOTE — Plan of Care (Signed)

## 2021-01-02 NOTE — Discharge Instructions (Signed)
Confusion Confusion is the inability to think with your usual speed or clarity. Confusion can be caused by many things. People who are confused often describe their thinking as cloudy or unclear. Confusion can also include feeling disoriented. This means you are unaware of where you are or who you are. You may also not know the date or time. When confused, you may have trouble remembering, paying attention, or making decisions. Some people also act aggressively when they are confused. In some cases, confusion may come on quickly. In other cases, it may develop slowly over time. Confusion may be caused by medical conditions such as:  Infections, such as a urinary tract infection (UTI).  Low levels of oxygen, which can develop from conditions such as long-term lung disorders.  Decrease in brain function due to dementia and other conditions that affect the brain, such as seizures, strokes, brain tumors, or head injuries.  Mental health conditions, like panic attacks, anxiety, depression, and hallucinations. Confusion may also be caused by physical factors such as:  Loss of fluid (dehydration) or an imbalance of salts and minerals in the body (electrolytes).  Lack of certain nutrients like niacin, thiamine, or other B vitamins.  Fever or hypothermia, which is a sudden drop in body temperature.  Low or high blood sugar.  Low or high blood pressure. Other causes include:  Lack of sleep or changes in routine or surroundings, such as when traveling or staying in a hospital.  Using too much alcohol, drugs, or medicine.  Side effects of medicines, or taking medicines that affect other medicines (drug interactions). Follow these instructions at home: Pay attention to your symptoms. Tell your health care provider about any changes or if you develop new symptoms. Follow these instructions to control or treat symptoms. Ask a family member or friend for help if needed. Medicines  Take  over-the-counter and prescription medicines only as told by your health care provider.  Ask your health care provider about changing or stopping any medicines that may be causing your confusion.  Avoid pain medicines or sleep medicines until you have fully recovered.  Use a pillbox or an alarm to help you take the right medicines at the right time.   Lifestyle  Eat a balanced diet that includes fruits and vegetables.  Get enough sleep. For most adults, this is 7-9 hours each night.  Do not drink alcohol.  Do not become isolated. Spend time with other people and make plans for your days.  Do not drive until your health care provider says that it is safe to do so.  Do not use any products that contain nicotine or tobacco, such as cigarettes, e-cigarettes, and chewing tobacco. If you need help quitting, ask your health care provider.  Stop other activities that may increase your chances of getting hurt. These may include some work duties, sports activities, swimming, or bike riding. Ask your health care provider what activities are safe for you.   Tips for caregivers  Find out if the Ahlberg is confused. Ask the Eggert to state his or her name, age, and the date. If the Rybarczyk is unsure or answers incorrectly, he or she may be confused and need assistance.  Always introduce yourself, no matter how well the Bouffard knows you. Remind the Minella of his or her location.  Place a calendar and clock near the [place] who is confused. Keep a regular schedule. Make sure the [place] has plenty of light during the day and sleep at night.    Talk about current events and plans for the day.  Keep the environment calm, quiet, and peaceful.  Help the Hebert do the things that he or she is unable to do. These include: ? Taking medicines. ? Keeping medical appointments. ? Helping with household duties, including meal preparation. ? Running errands.  Get help if you need it. There are several support  groups for caregivers. If the Mota you are helping needs more support, consider day care, extended-care programs, or a skilled nursing facility. The Bozzi's health care provider may be able to help evaluate these options. General instructions  Monitor yourself for any conditions you may have. These can include: ? Checking your blood glucose levels if you have diabetes. ? Maintaining a healthy weight. ? Monitoring your blood pressure if you have hypertension. ? Monitoring your body temperature if you have a fever.  Keep all follow-up visits. This is important. Contact a health care provider if:  You have new symptoms or your symptoms get worse. Get help right away if you:  Feel that you are not able to care for yourself.  Develop severe headaches, repeated vomiting, seizures, blackouts, or slurred speech.  Have increasing confusion, weakness, numbness, restlessness, or personality changes.  Develop a loss of balance, have marked dizziness, feel uncoordinated, or fall.  Develop severe anxiety, or you have delusions or hallucinations. These symptoms may represent a serious problem that is an emergency. Do not wait to see if the symptoms will go away. Get medical help right away. Call your local emergency services (911 in the [place] Do not drive yourself to the hospital. Summary  Confusion is the inability to think with your usual speed or clarity. People who are confused often describe their thinking as cloudy or unclear.  Confusion can also include having trouble remembering, paying attention, or making decisions.  Confusion may come on quickly or develop slowly over time, depending on the cause. There are many different causes of confusion.  Ask for help from family members or friends if you are unable to take care of yourself. This information is not intended to replace advice given to you by your health care provider. Make sure you discuss any questions you have with your health  care provider. Document Revised: 11/16/2019 Document Reviewed: 11/16/2019 Elsevier Patient Education  2021 [place].    IMPORTANT INFORMATION: PAY CLOSE ATTENTION   PHYSICIAN DISCHARGE INSTRUCTIONS  Follow with Primary care provider  Inda Coke, [facility]  and other consultants as instructed by your Hospitalist Physician  [facility] IF SYMPTOMS COME BACK, WORSEN OR NEW PROBLEM DEVELOPS   Please note: You were cared for by a hospitalist during your hospital stay. Every effort will be made to forward records to your primary care provider.  You can request that your primary care provider send for your hospital records if they have not received them.  Once you are discharged, your primary care physician will handle any further medical issues. Please note that NO REFILLS for any discharge medications will be authorized once you are discharged, as it is imperative that you return to your primary care physician (or establish a relationship with a primary care physician if you do not have one) for your post hospital discharge needs so that they can reassess your need for medications and monitor your lab values.  Please get a complete blood count and chemistry panel checked by your Primary MD at your next visit, and again as instructed by your Primary  MD.  Get Medicines reviewed and adjusted: Please take all your medications with you for your next visit with your Primary MD  Laboratory/radiological data: Please request your Primary MD to go over all hospital tests and procedure/radiological results at the follow up, please ask your primary care provider to get all Hospital records sent to his/her office.  In some cases, they will be blood work, cultures and biopsy results pending at the time of your discharge. Please request that your primary care provider follow up on these results.  If you are diabetic, please bring your blood sugar readings with you to your  follow up appointment with primary care.    Please call and make your follow up appointments as soon as possible.    Also Note the following: If you experience worsening of your admission symptoms, develop shortness of breath, life threatening emergency, suicidal or homicidal thoughts you must seek medical attention immediately by calling 911 or calling your MD immediately  if symptoms less severe.  You must read complete instructions/literature along with all the possible adverse reactions/side effects for all the Medicines you take and that have been prescribed to you. Take any new Medicines after you have completely understood and accpet all the possible adverse reactions/side effects.   Do not drive when taking Pain medications or sleeping medications (Benzodiazepines)  Do not take more than prescribed Pain, Sleep and Anxiety Medications. It is not advisable to combine anxiety,sleep and pain medications without talking with your primary care practitioner  Special Instructions: If you have smoked or chewed Tobacco  in the last 2 yrs please stop smoking, stop any regular Alcohol  and or any Recreational drug use.  Wear Seat belts while driving.  Do not drive if taking any narcotic, mind altering or controlled substances or recreational drugs or alcohol.

## 2021-01-02 NOTE — Plan of Care (Signed)

## 2021-01-02 NOTE — Discharge Summary (Signed)
Physician Discharge Summary  Finley Dinkel Vetrano NTZ:[id number] DOB: 18-Feb-1966 DOA: 12/31/2020  PCP: Inda Coke, PA  Admit date: 12/31/2020 Discharge date: 01/02/2021  Admitted From:  Home  Disposition: Home   Recommendations for Outpatient Follow-up:  1. Follow up with PCP in 1 weeks 2. Follow up with cardiology in 2 weeks 3. amb referral to endocrinology and diabetes and nutrition education  Discharge Condition: STABLE   CODE STATUS: FULL  DIET: heart health, carb modified   Brief Hospitalization Summary: Please see all hospital notes, images, labs for full details of the hospitalization. ADMISSION HPI: Novice Vrba Singleton is a 55 y.o. female with medical history significant for  Stroke with residual deficits, COPD, diabetes mellitus, congestive heart failure, hypertension, atrial fibrillation, CKD 3.    Patient was brought to the ED via EMS with reports of change in mental status.  At the time of my evaluation, patient is back to baseline, and able to give brief answers to simple questions.  Patient's sister is at bedside, and she helps with the history.  Earlier today, patient was with family-her aunt Edmonia Lynch when suddenly she was " out of it" was not responding to family.  This lasted until she was brought to the ED. Per EMS she was responding some, and blood sugars was over 600.  Patient has chronic right-sided weakness, and expressive aphasia with dysarthria, these are unchanged.  Patient tells me she has pain with urination, lower abdominal pain over the past few days and acute on chronic back pains.  She reports compliance with her insulins.  Patient also reported some difficulty breathing, and cough both of which are new.  ED Course: Temperature 97.7.  Initially tachycardic to 121, blood pressure systolic 675F to 163W, respiratory rate 15-20, O2 sats > 98% on room air.  Code stroke initially called was canceled after establishing deficits were chronic.  Head CT shows similar appearance of  large left MCA territory remote infarct.  On-call neurologist Dr. Quinn Axe consulted, patient will be admitted for AMS, if neurologic concerns persist, in-house neurology can be consulted.  HOSPITAL COURSE   Pt was admitted for metabolic encephalopathy thought secondary to UTI uncontrolled hyperglycemia and dehydration treated with insulin, IV fluids and antibiotics.  She responded well to these treatments and is feeling much better.  She is eating and drinking well and her mentation is back to baseline.  Her other chronic medical conditions have been stable.  Outpatient referral to endocrinology for uncontrolled diabetes mellitus and referral to diabetes and nutrition outpatient education have been made.  The patient is stable to discharge home with outpatient follow-up.  Discharge Diagnoses:  Principal Problem:   Acute metabolic encephalopathy Active Problems:   COPD (chronic obstructive pulmonary disease) (HCC)   Type 2 diabetes mellitus with diabetic autonomic neuropathy, with long-term current use of insulin (HCC)   Essential hypertension   Chronic diastolic congestive heart failure (HCC)   Prolonged QT interval   Atrial fibrillation, chronic (HCC)   Hyperglycemia   Discharge Instructions: Discharge Instructions    Ambulatory referral to Endocrinology   Complete by: As directed    Referral to Nutrition and Diabetes Services   Complete by: As directed    Choose type of Diabetes Self-Management Training (DSMT) training services and number of hours requested: Initial DSMT: 10 hours   Check all special needs that apply to patient requiring 1 on 1 DSMT: Learning disability   DSMT Content: Comprehensive self-management skills- All of the content areas   Choose the type  of Medical Nutrition Therapy (MNT) and number of hours: Initial MNT: 3 hours   FOR MEDICARE PATIENTS: I hereby certify that I am managing this beneficiary's diabetes condition and that the above prescribed training is a  necessary part of management.: Yes     Allergies as of 01/02/2021      Reactions   Bee Venom Shortness Of Breath, Swelling   Bodily Swelling   Losartan Other (See Comments)   Nosebleeds per patient report.    Naproxen Other (See Comments)   Acid reflux   Penicillins Nausea Only   Has patient had a PCN reaction causing immediate rash, facial/tongue/throat swelling, SOB or lightheadedness with hypotension: no Has patient had a PCN reaction causing severe rash involving mucus membranes or skin necrosis: no Has patient had a PCN reaction that required hospitalization no Has patient had a PCN reaction occurring within the last 10 years: no If all of the above answers are "NO", then may proceed with Cephalosporin    Lisinopril    Sinus congestion      Medication List    STOP taking these medications   lidocaine 5 % Commonly known as: Lidoderm   rosuvastatin 20 MG tablet Commonly known as: CRESTOR     TAKE these medications   acetaminophen 325 MG tablet Commonly known as: TYLENOL Take 650 mg by mouth every 8 (eight) hours as needed for mild pain.   apixaban 5 MG Tabs tablet Commonly known as: Eliquis Take 1 tablet (5 mg total) by mouth 2 (two) times daily.   aspirin EC 81 MG tablet Take 1 tablet (81 mg total) by mouth daily. Swallow whole.   BD Pen Needle Nano U/F 32G X 4 MM Misc Generic drug: Insulin Pen Needle Inject into the skin as directed.   carvedilol 25 MG tablet Commonly known as: COREG Take 2 tablets (50 mg total) by mouth 2 (two) times daily with a meal.   cefdinir 300 MG capsule Commonly known as: OMNICEF Take 1 capsule (300 mg total) by mouth daily for 3 days.   furosemide 20 MG tablet Commonly known as: Lasix Take 1 tablet (20 mg total) by mouth daily.   gabapentin 100 MG capsule Commonly known as: NEURONTIN Take 1 capsule (100 mg total) by mouth 3 (three) times daily.   hydrALAZINE 25 MG tablet Commonly known as: APRESOLINE Take 1 tablet (25 mg  total) by mouth 3 (three) times daily.   insulin glargine 100 UNIT/ML injection Commonly known as: LANTUS Inject 18-20 Units into the skin at bedtime. If over 250 give another 2 units   isosorbide mononitrate 30 MG 24 hr tablet Commonly known as: IMDUR Take 2 tablets (60 mg total) by mouth in the morning and at bedtime.   nitroGLYCERIN 0.4 MG SL tablet Commonly known as: NITROSTAT Place 1 tablet (0.4 mg total) under the tongue every 5 (five) minutes as needed for chest pain.   pantoprazole 40 MG tablet Commonly known as: PROTONIX Take 1 tablet by mouth once daily       Follow-up Information    Inda Coke, [place]. Schedule an appointment as soon as possible for a visit in 1 week(s).   Specialty: Physician Assistant Contact information: [place] [address] [phone]        Arnoldo Lenis, MD. Schedule an appointment as soon as possible for a visit in 2 week(s).   Specialty: Cardiology Contact information: [address] [place] [address] [phone]  Allergies  Allergen Reactions  . Bee Venom Shortness Of Breath and Swelling    Bodily Swelling  . Losartan Other (See Comments)    Nosebleeds per patient report.   . Naproxen Other (See Comments)    Acid reflux  . Penicillins Nausea Only    Has patient had a PCN reaction causing immediate rash, facial/tongue/throat swelling, SOB or lightheadedness with hypotension: no Has patient had a PCN reaction causing severe rash involving mucus membranes or skin necrosis: no Has patient had a PCN reaction that required hospitalization no Has patient had a PCN reaction occurring within the last 10 years: no If all of the above answers are "NO", then may proceed with Cephalosporin   . Lisinopril     Sinus congestion   Allergies as of 01/02/2021      Reactions   Bee Venom Shortness Of Breath, Swelling   Bodily Swelling   Losartan Other (See Comments)   Nosebleeds per  patient report.    Naproxen Other (See Comments)   Acid reflux   Penicillins Nausea Only   Has patient had a PCN reaction causing immediate rash, facial/tongue/throat swelling, SOB or lightheadedness with hypotension: no Has patient had a PCN reaction causing severe rash involving mucus membranes or skin necrosis: no Has patient had a PCN reaction that required hospitalization no Has patient had a PCN reaction occurring within the last 10 years: no If all of the above answers are "NO", then may proceed with Cephalosporin    Lisinopril    Sinus congestion      Medication List    STOP taking these medications   lidocaine 5 % Commonly known as: Lidoderm   rosuvastatin 20 MG tablet Commonly known as: CRESTOR     TAKE these medications   acetaminophen 325 MG tablet Commonly known as: TYLENOL Take 650 mg by mouth every 8 (eight) hours as needed for mild pain.   apixaban 5 MG Tabs tablet Commonly known as: Eliquis Take 1 tablet (5 mg total) by mouth 2 (two) times daily.   aspirin EC 81 MG tablet Take 1 tablet (81 mg total) by mouth daily. Swallow whole.   BD Pen Needle Nano U/F 32G X 4 MM Misc Generic drug: Insulin Pen Needle Inject into the skin as directed.   carvedilol 25 MG tablet Commonly known as: COREG Take 2 tablets (50 mg total) by mouth 2 (two) times daily with a meal.   cefdinir 300 MG capsule Commonly known as: OMNICEF Take 1 capsule (300 mg total) by mouth daily for 3 days.   furosemide 20 MG tablet Commonly known as: Lasix Take 1 tablet (20 mg total) by mouth daily.   gabapentin 100 MG capsule Commonly known as: NEURONTIN Take 1 capsule (100 mg total) by mouth 3 (three) times daily.   hydrALAZINE 25 MG tablet Commonly known as: APRESOLINE Take 1 tablet (25 mg total) by mouth 3 (three) times daily.   insulin glargine 100 UNIT/ML injection Commonly known as: LANTUS Inject 18-20 Units into the skin at bedtime. If over 250 give another 2 units    isosorbide mononitrate 30 MG 24 hr tablet Commonly known as: IMDUR Take 2 tablets (60 mg total) by mouth in the morning and at bedtime.   nitroGLYCERIN 0.4 MG SL tablet Commonly known as: NITROSTAT Place 1 tablet (0.4 mg total) under the tongue every 5 (five) minutes as needed for chest pain.   pantoprazole 40 MG tablet Commonly known as: PROTONIX Take 1 tablet by mouth once  daily       Procedures/Studies: DG Chest 2 View  Result Date: 12/31/2020 CLINICAL DATA:  55 year old female with cough and shortness of breath. EXAM: CHEST - 2 VIEW COMPARISON:  Chest radiograph dated 09/07/2020. FINDINGS: There is cardiomegaly with mild vascular congestion. No focal consolidation, pleural effusion, or pneumothorax. No acute osseous pathology. IMPRESSION: Cardiomegaly with mild vascular congestion. Electronically Signed   By: Anner Crete M.D.   On: 12/31/2020 19:07   CT HEAD CODE STROKE WO CONTRAST  Result Date: 12/31/2020 CLINICAL DATA:  Code stroke.  Neuro deficit, acute stroke suspected. EXAM: CT HEAD WITHOUT CONTRAST TECHNIQUE: Contiguous axial images were obtained from the base of the skull through the vertex without intravenous contrast. COMPARISON:  April levin 2022. FINDINGS: Brain: No evidence of acute large vascular territory infarct, acute hemorrhage, hydrocephalus, mass lesion or sizeable extra-axial fluid collection. Similar appearance of a large left MCA territory remote infarct. Additional small right posterior temporal lobe infarct was better characterized on prior MRI. Vascular: No hyperdense vessel identified. Skull: No acute fracture. Sinuses/Orbits: Mild ethmoid air cell mucosal thickening without air-fluid levels. Other: No mastoid effusions. ASPECTS [facility] Stroke Program Early CT Score) total score (0-10 with 10 being normal): 10. IMPRESSION: 1. No evidence of acute large vascular territory infarct or acute hemorrhage. ASPECTS is 10 2. Similar appearance of a large left MCA  territory remote infarct. An MRI could better evaluate for acute on chronic peri-infarct ischemia if clinically indicated. Code stroke imaging results were communicated on 12/31/2020 at 2:20 pm to provider Dr. Laverta Baltimore Via telephone, who verbally acknowledged these results. Electronically Signed   By: Margaretha Sheffield MD   On: 12/31/2020 14:22     Subjective: Pt reports that she is feeling much better, her abdominal discomfort has resolved now.    Discharge Exam: Vitals:   01/01/21 2126 01/02/21 0523  BP: (!) 155/82 (!) 152/82  Pulse: (!) 103 86  Resp:  20  Temp: 98.5 F (36.9 C) 98.1 F (36.7 C)  SpO2: 98% 98%   Vitals:   01/01/21 1407 01/01/21 1436 01/01/21 2126 01/02/21 0523  BP: (!) 152/96 (!) 153/108 (!) 155/82 (!) 152/82  Pulse: 100 (!) 105 (!) 103 86  Resp: 20   20  Temp: 99.4 F (37.4 C)  98.5 F (36.9 C) 98.1 F (36.7 C)  TempSrc: Oral  Oral Oral  SpO2: 100% 100% 98% 98%  Weight:      Height:       General: Pt is alert, awake, not in acute distress Cardiovascular: normal S1/S2 +, no rubs, no gallops Respiratory: CTA bilaterally, no wheezing, no rhonchi Abdominal: Soft, NT, ND, bowel sounds + Extremities: no edema, no cyanosis Neurological: nonfocal exam.    The results of significant diagnostics from this hospitalization (including imaging, microbiology, ancillary and laboratory) are listed below for reference.     Microbiology: Recent Results (from the past 240 hour(s))  Resp Panel by RT-PCR (Flu A&B, Covid) Nasopharyngeal Swab     Status: None   Collection Time: 12/31/20  1:57 PM   Specimen: Nasopharyngeal Swab; Nasopharyngeal(NP) swabs in vial transport medium  Result Value Ref Range Status   SARS Coronavirus 2 by RT PCR NEGATIVE NEGATIVE Final    Comment: (NOTE) SARS-CoV-2 target nucleic acids are NOT DETECTED.  The SARS-CoV-2 RNA is generally detectable in upper respiratory specimens during the acute phase of infection. The lowest concentration of  SARS-CoV-2 viral copies this assay can detect is 138 copies/mL. A negative result does not preclude  SARS-Cov-2 infection and should not be used as the sole basis for treatment or other patient management decisions. A negative result may occur with  improper specimen collection/handling, submission of specimen other than nasopharyngeal swab, presence of viral mutation(s) within the areas targeted by this assay, and inadequate number of viral copies(<138 copies/mL). A negative result must be combined with clinical observations, patient history, and epidemiological information. The expected result is Negative.  Fact Sheet for Patients:  EntrepreneurPulse.com.au  Fact Sheet for Healthcare Providers:  IncredibleEmployment.be  This test is no t yet approved or cleared by the [place] FDA and  has been authorized for detection and/or diagnosis of SARS-CoV-2 by FDA under an Emergency Use Authorization (EUA). This EUA will remain  in effect (meaning this test can be used) for the duration of the COVID-19 declaration under Section 564(b)(1) of the Act, 21 U.S.C.section 360bbb-3(b)(1), unless the authorization is terminated  or revoked sooner.       Influenza A by PCR NEGATIVE NEGATIVE Final   Influenza B by PCR NEGATIVE NEGATIVE Final    Comment: (NOTE) The Xpert Xpress SARS-CoV-2/FLU/RSV plus assay is intended as an aid in the diagnosis of influenza from Nasopharyngeal swab specimens and should not be used as a sole basis for treatment. Nasal washings and aspirates are unacceptable for Xpert Xpress SARS-CoV-2/FLU/RSV testing.  Fact Sheet for Patients: EntrepreneurPulse.com.au  Fact Sheet for Healthcare Providers: IncredibleEmployment.be  This test is not yet approved or cleared by the [place] FDA and has been authorized for detection and/or diagnosis of SARS-CoV-2 by FDA under an Emergency Use  Authorization (EUA). This EUA will remain in effect (meaning this test can be used) for the duration of the COVID-19 declaration under Section 564(b)(1) of the Act, 21 U.S.C. section 360bbb-3(b)(1), unless the authorization is terminated or revoked.  Performed at [facility], [address], [place], [place] [address]   Culture, Urine     Status: None   Collection Time: 12/31/20  1:57 PM   Specimen: Urine, Clean Catch  Result Value Ref Range Status   Specimen Description   Final    URINE, CLEAN CATCH Performed at [facility], [address] [place], [place] [address]    Special Requests   Final    NONE Performed at [facility], [address], [place], [place] [address]    Culture   Final    NO GROWTH Performed at [facility], [place] [address], [place],  [address]    Report Status 01/02/2021 FINAL  Final     Labs: BNP (last 3 results) Recent Labs    07/09/20 0631 09/07/20 1243  BNP 644.0* 500.3*   Basic Metabolic Panel: Recent Labs  Lab 12/31/20 1357 12/31/20 1440 01/01/21 0552 01/01/21 2157 01/02/21 0618  NA 131* 136 137  --  137  K 3.8 4.0 3.9  --  3.8  CL 97* 98 105  --  106  CO2 25  --  24  --  24  GLUCOSE 498* 462* 218* 333* 165*  BUN 20 20 15   --  20  CREATININE 1.85* 1.80* 1.48*  --  1.49*  CALCIUM 8.2*  --  8.6*  --  8.8*  MG 1.8  --   --   --  1.7   Liver Function Tests: Recent Labs  Lab 12/31/20 1357 01/02/21 0618  AST 16 12*  ALT 13 11  ALKPHOS 115 94  BILITOT 0.5 0.4  PROT 7.1 6.4*  ALBUMIN 3.3* 3.0*  No results for input(s): LIPASE, AMYLASE in the last 168 hours. No results for input(s): AMMONIA in the last 168 hours. CBC: Recent Labs  Lab 12/31/20 1357 12/31/20 1440 01/01/21 0552 01/02/21 0618  WBC 6.7  --  6.7 7.2  NEUTROABS 3.7  --   --  3.4  HGB 11.4* 12.6 11.0* 10.5*  HCT 37.3 37.0 36.1 34.9*  MCV 79.9*  --  80.0 79.9*  PLT 244  --  257 240   Cardiac Enzymes: No results for input(s): CKTOTAL, CKMB,  CKMBINDEX, TROPONINI in the last 168 hours. BNP: Invalid input(s): POCBNP CBG: Recent Labs  Lab 01/01/21 1231 01/01/21 1612 01/01/21 2128 01/02/21 0040 01/02/21 0808  GLUCAP 245* 193* 404* 231* 159*   D-Dimer No results for input(s): DDIMER in the last 72 hours. Hgb A1c No results for input(s): HGBA1C in the last 72 hours. Lipid Profile No results for input(s): CHOL, HDL, LDLCALC, TRIG, CHOLHDL, LDLDIRECT in the last 72 hours. Thyroid function studies No results for input(s): TSH, T4TOTAL, T3FREE, THYROIDAB in the last 72 hours.  Invalid input(s): FREET3 Anemia work up No results for input(s): VITAMINB12, FOLATE, FERRITIN, TIBC, IRON, RETICCTPCT in the last 72 hours. Urinalysis    Component Value Date/Time   COLORURINE STRAW (A) 12/31/2020 1357   APPEARANCEUR CLEAR 12/31/2020 1357   LABSPEC 1.016 12/31/2020 1357   PHURINE 6.0 12/31/2020 1357   GLUCOSEU >=500 (A) 12/31/2020 1357   GLUCOSEU >=1000 (A) 09/21/2018 0829   HGBUR NEGATIVE 12/31/2020 1357   BILIRUBINUR NEGATIVE 12/31/2020 1357   KETONESUR NEGATIVE 12/31/2020 1357   PROTEINUR NEGATIVE 12/31/2020 1357   UROBILINOGEN 0.2 09/21/2018 0829   NITRITE NEGATIVE 12/31/2020 1357   LEUKOCYTESUR MODERATE (A) 12/31/2020 1357   Sepsis Labs Invalid input(s): PROCALCITONIN,  WBC,  LACTICIDVEN Microbiology Recent Results (from the past 240 hour(s))  Resp Panel by RT-PCR (Flu A&B, Covid) Nasopharyngeal Swab     Status: None   Collection Time: 12/31/20  1:57 PM   Specimen: Nasopharyngeal Swab; Nasopharyngeal(NP) swabs in vial transport medium  Result Value Ref Range Status   SARS Coronavirus 2 by RT PCR NEGATIVE NEGATIVE Final    Comment: (NOTE) SARS-CoV-2 target nucleic acids are NOT DETECTED.  The SARS-CoV-2 RNA is generally detectable in upper respiratory specimens during the acute phase of infection. The lowest concentration of SARS-CoV-2 viral copies this assay can detect is 138 copies/mL. A negative result does not  preclude SARS-Cov-2 infection and should not be used as the sole basis for treatment or other patient management decisions. A negative result may occur with  improper specimen collection/handling, submission of specimen other than nasopharyngeal swab, presence of viral mutation(s) within the areas targeted by this assay, and inadequate number of viral copies(<138 copies/mL). A negative result must be combined with clinical observations, patient history, and epidemiological information. The expected result is Negative.  Fact Sheet for Patients:  EntrepreneurPulse.com.au  Fact Sheet for Healthcare Providers:  IncredibleEmployment.be  This test is no t yet approved or cleared by the [place] FDA and  has been authorized for detection and/or diagnosis of SARS-CoV-2 by FDA under an Emergency Use Authorization (EUA). This EUA will remain  in effect (meaning this test can be used) for the duration of the COVID-19 declaration under Section 564(b)(1) of the Act, 21 U.S.C.section 360bbb-3(b)(1), unless the authorization is terminated  or revoked sooner.       Influenza A by PCR NEGATIVE NEGATIVE Final   Influenza B by PCR NEGATIVE NEGATIVE Final    Comment: (NOTE)  The Xpert Xpress SARS-CoV-2/FLU/RSV plus assay is intended as an aid in the diagnosis of influenza from Nasopharyngeal swab specimens and should not be used as a sole basis for treatment. Nasal washings and aspirates are unacceptable for Xpert Xpress SARS-CoV-2/FLU/RSV testing.  Fact Sheet for Patients: EntrepreneurPulse.com.au  Fact Sheet for Healthcare Providers: IncredibleEmployment.be  This test is not yet approved or cleared by the [place] FDA and has been authorized for detection and/or diagnosis of SARS-CoV-2 by FDA under an Emergency Use Authorization (EUA). This EUA will remain in effect (meaning this test can be used) for the  duration of the COVID-19 declaration under Section 564(b)(1) of the Act, 21 U.S.C. section 360bbb-3(b)(1), unless the authorization is terminated or revoked.  Performed at [facility], [address], [place], [place] [address]   Culture, Urine     Status: None   Collection Time: 12/31/20  1:57 PM   Specimen: Urine, Clean Catch  Result Value Ref Range Status   Specimen Description   Final    URINE, CLEAN CATCH Performed at [facility], [address] [place], [place] [address]    Special Requests   Final    NONE Performed at [facility], [address], [place], [place] [address]    Culture   Final    NO GROWTH Performed at [facility], [place] [address] [place], [place] [address]    Report Status 01/02/2021 FINAL  Final    Time coordinating discharge: 35 mins   SIGNED:  Irwin Brakeman, MD  [facility] 01/02/2021, 12:27 PM How to contact the [facility] Attending or Consulting provider [place] or covering provider during after hours [place], for this patient?  1. Check the care team in [facility] and look for a) attending/consulting [facility] provider listed and b) the [facility] team listed 2. Log into [email or web address] and use [facility] universal password to access. If you do not have the password, please contact the hospital operator. 3. Locate the [facility] provider you are looking for under [facility] and page to a number that you can be directly reached. 4. If you still have difficulty reaching the provider, please page the [facility] (Director on Call) for the Hospitalists listed on amion for assistance.

## 2021-01-02 NOTE — Plan of Care (Signed)

## 2021-01-02 NOTE — TOC Transition Note (Signed)
Transition of Care [facility]) - CM/SW Discharge Note   Patient Details  Name: Kiona Blume Belasco MRN: [id number] Date of Birth: 03-Aug-1966  Transition of Care [facility]) CM/SW Contact:  Boneta Lucks, RN Phone Number: 01/02/2021, 1:19 PM   Clinical Narrative:   Patient admitted in OBS for Acute metabolic encephalopathy, patient is active with [facility] for ST/OT and PT is recommending HHPT.  TOC updated MD to place orders and updated Santiago Glad with [facility]. Added to AVS.    Final next level of care: [place] Barriers to Discharge: Barriers Resolved   Patient Goals and CMS Choice Patient states their goals for this hospitalization and ongoing recovery are:: to go home. CMS Medicare.gov Compare Post Acute Care list provided to:: Patient    Discharge Placement            Patient and family notified of of transfer: 01/02/21  Discharge Plan and Services     Readmission Risk Interventions Readmission Risk Prevention Plan 08/15/2020 05/12/2020  Transportation Screening Complete Complete  Medication Review Press photographer) Complete Complete  HRI or Home Care Consult Complete -  SW Recovery Care/Counseling Consult Complete Complete  Palliative Care Screening Not Applicable Not [facility] - Complete  Some recent data might be hidden

## 2021-01-03 ENCOUNTER — Ambulatory Visit: Payer: 59 | Admitting: Physician Assistant

## 2021-01-03 LAB — GLUCOSE, CAPILLARY: Glucose-Capillary: 273 mg/dL — ABNORMAL HIGH (ref 70–99)

## 2021-01-11 ENCOUNTER — Telehealth: Payer: Self-pay

## 2021-01-11 NOTE — Telephone Encounter (Signed)
Left detailed message on Robin [name]'s personal voicemail okay for OT/PT/Skilled nursing/Social work/Speech:PT, 1 x a week for 4 weeks for pt okay per Dr.Parker. Any questions please call office at [phone].

## 2021-01-11 NOTE — Telephone Encounter (Signed)
.  Home Health verbal orders-caller/Agency: [place] number: [phone]   Requesting OT/PT/Skilled nursing/Social Work/Speech:PT   Reason:continuing care   Frequency: 1w for 4 week

## 2021-02-28 ENCOUNTER — Other Ambulatory Visit: Payer: Self-pay

## 2021-02-28 ENCOUNTER — Emergency Department (HOSPITAL_COMMUNITY): Payer: 59

## 2021-02-28 ENCOUNTER — Emergency Department (HOSPITAL_COMMUNITY)
Admission: EM | Admit: 2021-02-28 | Discharge: 2021-03-01 | Disposition: A | Discharge status: Home / Self Care | Payer: 59 | Attending: Emergency Medicine | Admitting: Emergency Medicine

## 2021-02-28 ENCOUNTER — Encounter (HOSPITAL_COMMUNITY): Payer: Self-pay | Admitting: *Deleted

## 2021-02-28 DIAGNOSIS — J449 Chronic obstructive pulmonary disease, unspecified: Secondary | ICD-10-CM | POA: Diagnosis not present

## 2021-02-28 DIAGNOSIS — R1031 Right lower quadrant pain: Secondary | ICD-10-CM

## 2021-02-28 DIAGNOSIS — R739 Hyperglycemia, unspecified: Secondary | ICD-10-CM

## 2021-02-28 DIAGNOSIS — E1165 Type 2 diabetes mellitus with hyperglycemia: Secondary | ICD-10-CM | POA: Diagnosis not present

## 2021-02-28 DIAGNOSIS — I5032 Chronic diastolic (congestive) heart failure: Secondary | ICD-10-CM | POA: Insufficient documentation

## 2021-02-28 DIAGNOSIS — Z87891 Personal history of nicotine dependence: Secondary | ICD-10-CM | POA: Diagnosis not present

## 2021-02-28 DIAGNOSIS — Z79899 Other long term (current) drug therapy: Secondary | ICD-10-CM | POA: Insufficient documentation

## 2021-02-28 DIAGNOSIS — I110 Hypertensive heart disease with heart failure: Secondary | ICD-10-CM | POA: Diagnosis not present

## 2021-02-28 DIAGNOSIS — Z7982 Long term (current) use of aspirin: Secondary | ICD-10-CM | POA: Diagnosis not present

## 2021-02-28 DIAGNOSIS — Z7901 Long term (current) use of anticoagulants: Secondary | ICD-10-CM | POA: Diagnosis not present

## 2021-02-28 LAB — BASIC METABOLIC PANEL
Anion gap: 7 (ref 5–15)
BUN: 21 mg/dL — ABNORMAL HIGH (ref 6–20)
CO2: 24 mmol/L (ref 22–32)
Calcium: 8.7 mg/dL — ABNORMAL LOW (ref 8.9–10.3)
Chloride: 102 mmol/L (ref 98–111)
Creatinine, Ser: 1.59 mg/dL — ABNORMAL HIGH (ref 0.44–1.00)
GFR, Estimated: 38 mL/min — ABNORMAL LOW (ref >60)
Glucose, Bld: 350 mg/dL — ABNORMAL HIGH (ref 70–99)
Potassium: 4.3 mmol/L (ref 3.5–5.1)
Sodium: 133 mmol/L — ABNORMAL LOW (ref 135–145)

## 2021-02-28 LAB — COMPREHENSIVE METABOLIC PANEL
ALT: 14 U/L (ref 0–44)
AST: 20 U/L (ref 15–41)
Albumin: 3.4 g/dL — ABNORMAL LOW (ref 3.5–5.0)
Alkaline Phosphatase: 124 U/L (ref 38–126)
Anion gap: 8 (ref 5–15)
BUN: 24 mg/dL — ABNORMAL HIGH (ref 6–20)
CO2: 22 mmol/L (ref 22–32)
Calcium: 8.4 mg/dL — ABNORMAL LOW (ref 8.9–10.3)
Chloride: 92 mmol/L — ABNORMAL LOW (ref 98–111)
Creatinine, Ser: 1.99 mg/dL — ABNORMAL HIGH (ref 0.44–1.00)
GFR, Estimated: 29 mL/min — ABNORMAL LOW (ref >60)
Glucose, Bld: 717 mg/dL (ref 70–99)
Potassium: 4.1 mmol/L (ref 3.5–5.1)
Sodium: 122 mmol/L — ABNORMAL LOW (ref 135–145)
Total Bilirubin: 0.4 mg/dL (ref 0.3–1.2)
Total Protein: 7.1 g/dL (ref 6.5–8.1)

## 2021-02-28 LAB — BETA-HYDROXYBUTYRIC ACID: Beta-Hydroxybutyric Acid: 0.11 mmol/L (ref 0.05–0.27)

## 2021-02-28 LAB — URINALYSIS, ROUTINE W REFLEX MICROSCOPIC
Bacteria, UA: NONE SEEN
Bilirubin Urine: NEGATIVE
Glucose, UA: >=500 mg/dL — AB
Hgb urine dipstick: NEGATIVE
Ketones, ur: NEGATIVE mg/dL
Leukocytes,Ua: NEGATIVE
Nitrite: NEGATIVE
Protein, ur: NEGATIVE mg/dL
Specific Gravity, Urine: 1.024 (ref 1.005–1.030)
pH: 6 (ref 5.0–8.0)

## 2021-02-28 LAB — CBC WITH DIFFERENTIAL/PLATELET
Abs Immature Granulocytes: 0.02 10*3/uL (ref 0.00–0.07)
Basophils Absolute: 0.1 10*3/uL (ref 0.0–0.1)
Basophils Relative: 1 %
Eosinophils Absolute: 0.1 10*3/uL (ref 0.0–0.5)
Eosinophils Relative: 2 %
HCT: 37.8 % (ref 36.0–46.0)
Hemoglobin: 12 g/dL (ref 12.0–15.0)
Immature Granulocytes: 0 %
Lymphocytes Relative: 27 %
Lymphs Abs: 2.1 10*3/uL (ref 0.7–4.0)
MCH: 24.8 pg — ABNORMAL LOW (ref 26.0–34.0)
MCHC: 31.7 g/dL (ref 30.0–36.0)
MCV: 78.3 fL — ABNORMAL LOW (ref 80.0–100.0)
Monocytes Absolute: 0.6 10*3/uL (ref 0.1–1.0)
Monocytes Relative: 8 %
Neutro Abs: 4.7 10*3/uL (ref 1.7–7.7)
Neutrophils Relative %: 62 %
Platelets: 280 10*3/uL (ref 150–400)
RBC: 4.83 MIL/uL (ref 3.87–5.11)
RDW: 14.6 % (ref 11.5–15.5)
WBC: 7.5 10*3/uL (ref 4.0–10.5)
nRBC: 0 % (ref 0.0–0.2)

## 2021-02-28 LAB — OSMOLALITY: Osmolality: 313 mOsm/kg — ABNORMAL HIGH (ref 275–295)

## 2021-02-28 LAB — CBG MONITORING, ED
Glucose-Capillary: 482 mg/dL — ABNORMAL HIGH (ref 70–99)
Glucose-Capillary: 551 mg/dL (ref 70–99)
Glucose-Capillary: 419 mg/dL — ABNORMAL HIGH (ref 70–99)
Glucose-Capillary: 433 mg/dL — ABNORMAL HIGH (ref 70–99)
Glucose-Capillary: 314 mg/dL — ABNORMAL HIGH (ref 70–99)
Glucose-Capillary: 346 mg/dL — ABNORMAL HIGH (ref 70–99)
Glucose-Capillary: 269 mg/dL — ABNORMAL HIGH (ref 70–99)
Glucose-Capillary: 222 mg/dL — ABNORMAL HIGH (ref 70–99)

## 2021-02-28 LAB — LIPASE, BLOOD: Lipase: 41 U/L (ref 11–51)

## 2021-02-28 LAB — POC URINE PREG, ED: Preg Test, Ur: NEGATIVE

## 2021-02-28 IMAGING — CT CT ABD-PELV W/O CM
2 of 4 series · 17 of 46 positions shown, 19 images · non-contrast
Comparison: [DATE]

CLINICAL DATA: Right-sided abdominal pain.

EXAM:
CT ABDOMEN AND PELVIS WITHOUT CONTRAST
TECHNIQUE: Multidetector CT imaging of the abdomen and pelvis was performed
following the standard protocol without IV contrast.

[Series 2: axial st · axial · 0.75mm/px · z∈[+823,+1168]mm · 14 of 81 slices shown, 16 images]
[im 6/81  soft-tissue]
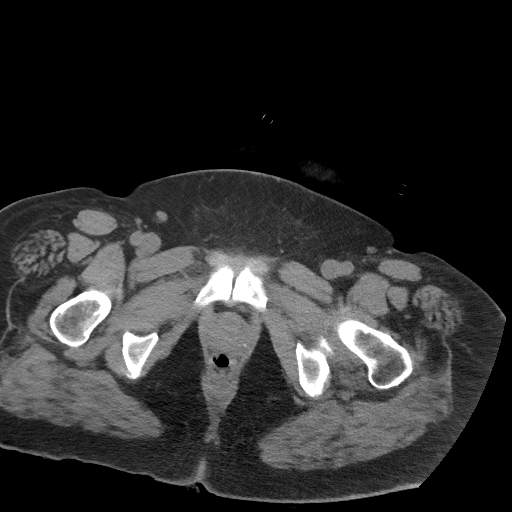
[im 6/81  bone]
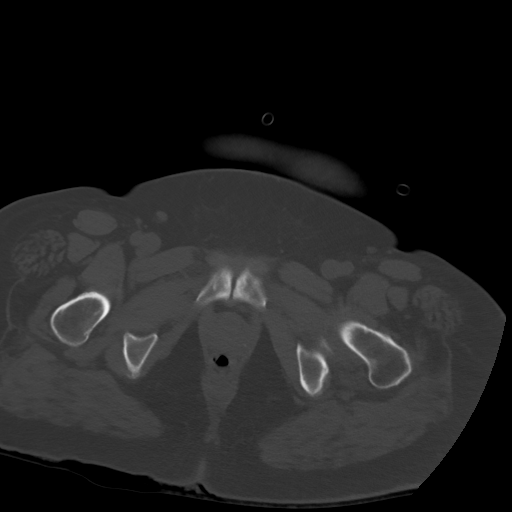
[im 11/81  soft-tissue]
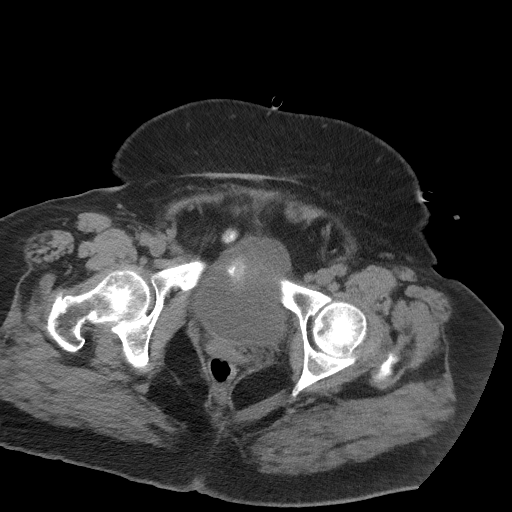
[im 17/81  soft-tissue]
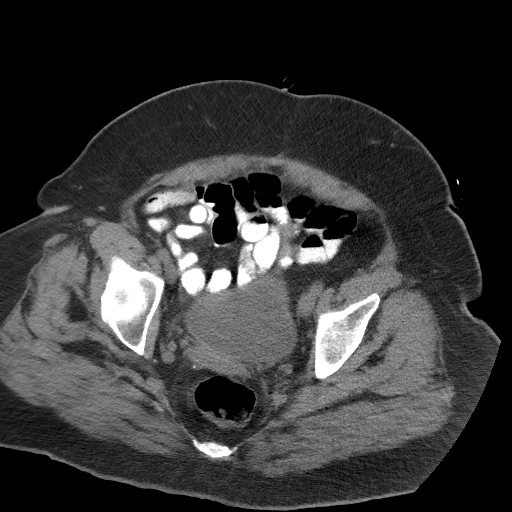
[im 22/81  soft-tissue]
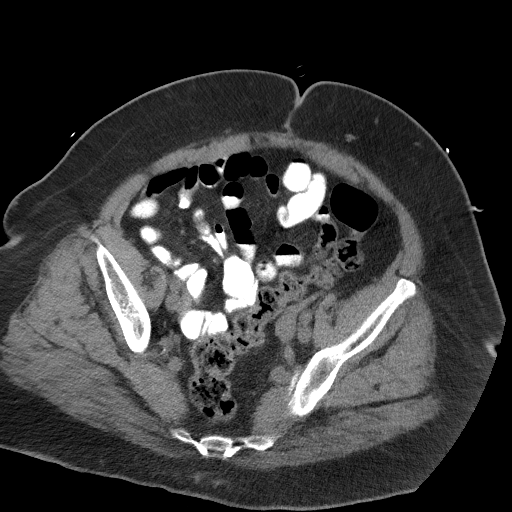
[im 27/81  soft-tissue]
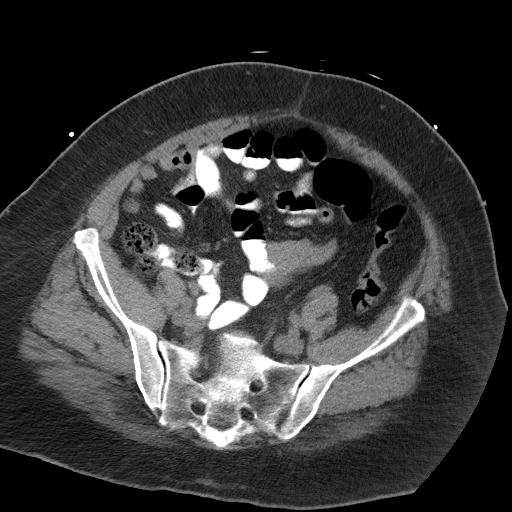
[im 33/81  soft-tissue]
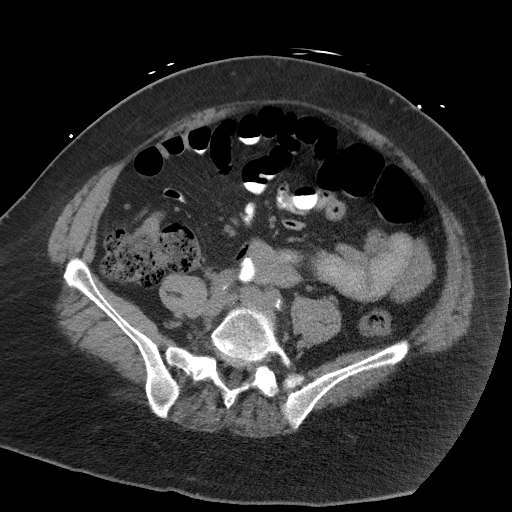
[im 38/81  soft-tissue]
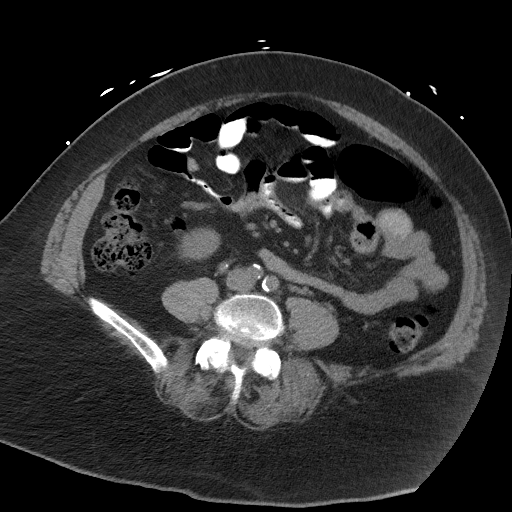
[im 43/81  soft-tissue]
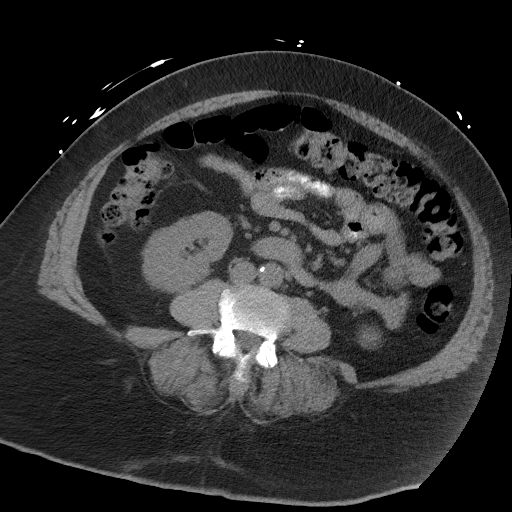
[im 49/81  soft-tissue]
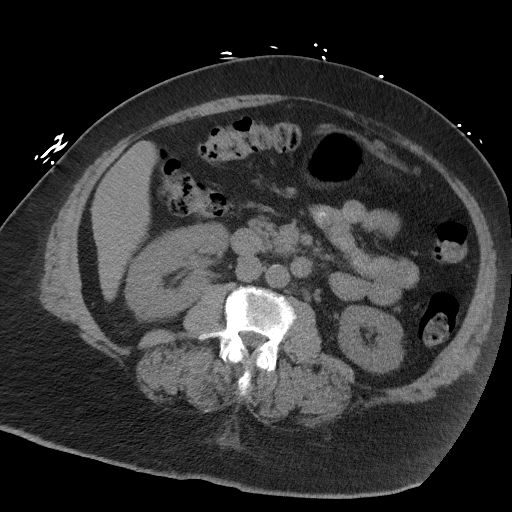
[im 49/81  bone]
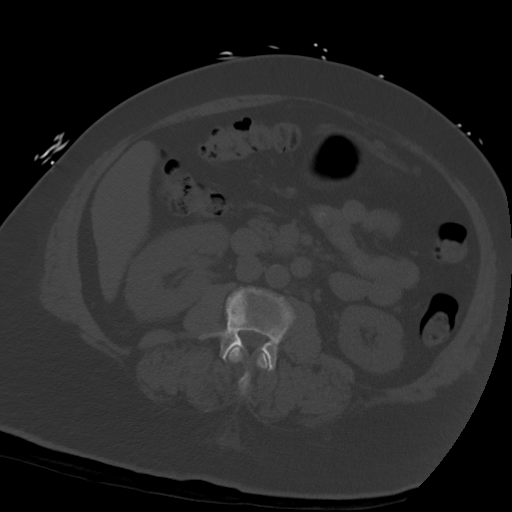
[im 54/81  soft-tissue]
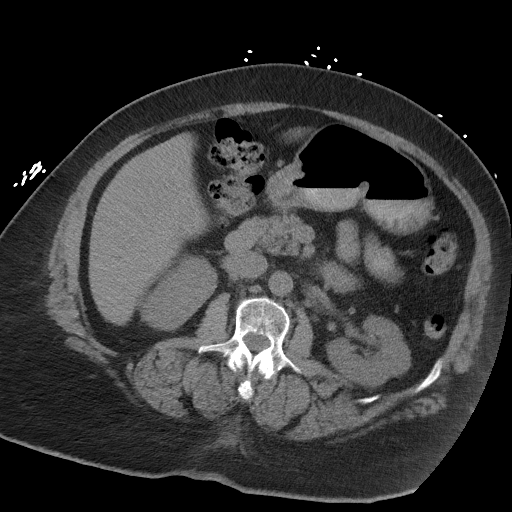
[im 59/81  soft-tissue]
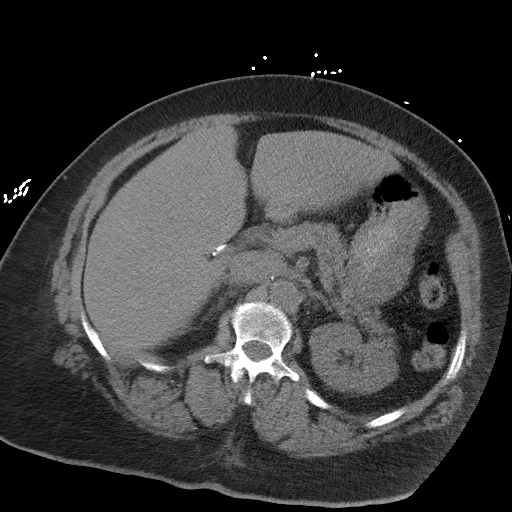
[im 65/81  soft-tissue]
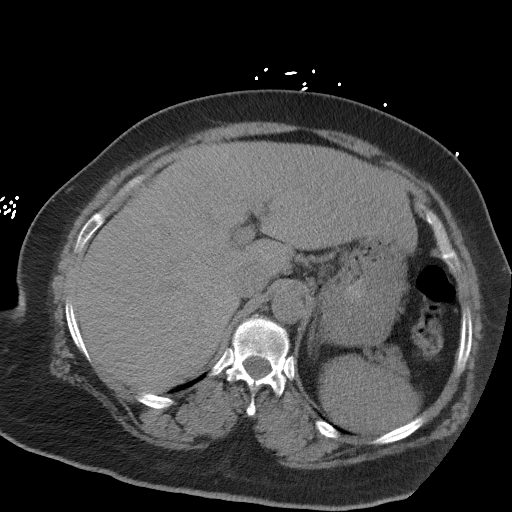
[im 70/81  soft-tissue]
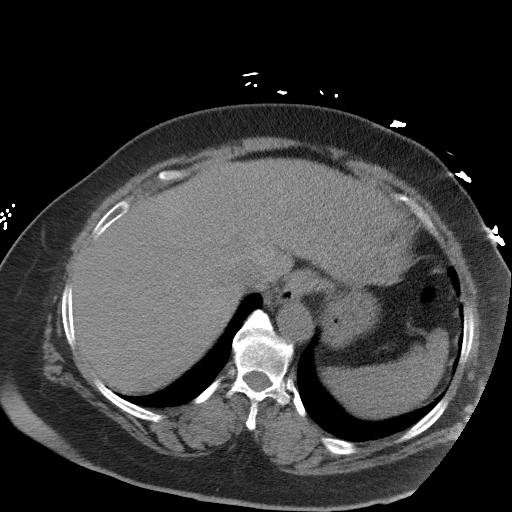
[im 75/81  soft-tissue]
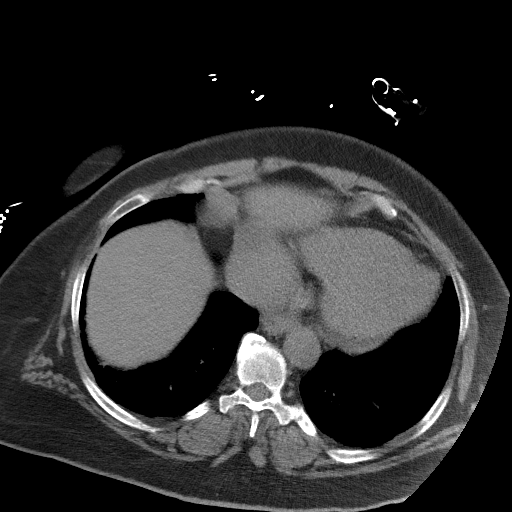

[Series 5: coronal st · coronal · 0.82mm/px · 3 of 108 slices shown]
[im 36/108  soft-tissue]
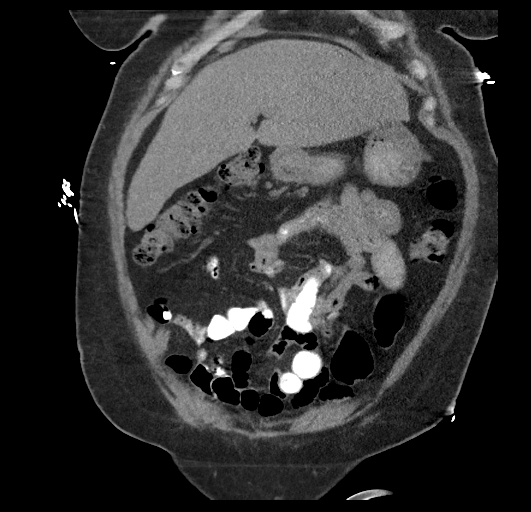
[im 48/108  soft-tissue]
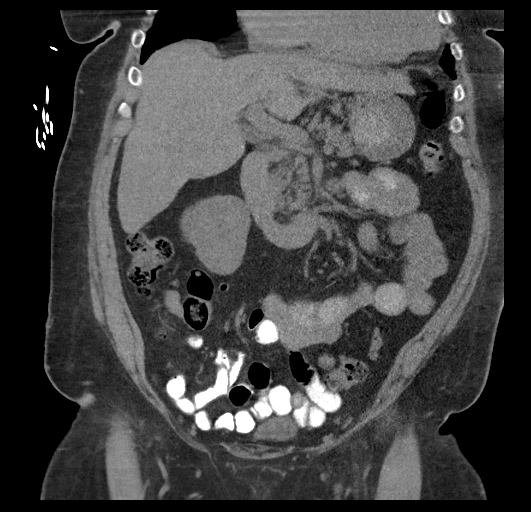
[im 60/108  soft-tissue]
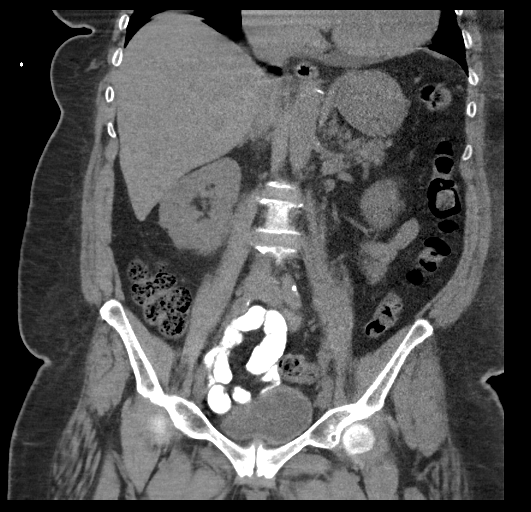

[17 of 46 positions shown; findings below may reference images not displayed]

FINDINGS: Lower chest: No acute abnormality.

Hepatobiliary: No focal liver abnormality is seen. Status post
cholecystectomy. No biliary dilatation.

Pancreas: Unremarkable. No pancreatic ductal dilatation or
surrounding inflammatory changes.

Spleen: Normal in size without focal abnormality.

Adrenals/Urinary Tract: Adrenal glands are unremarkable. Kidneys are
normal, without renal calculi, focal lesion, or hydronephrosis.
Bladder is unremarkable.

Stomach/Bowel: Stomach is within normal limits. Appendix appears
normal. Stool is seen throughout the large bowel. No evidence of
bowel wall thickening, distention, or inflammatory changes.

Vascular/Lymphatic: Aortic atherosclerosis. No enlarged abdominal or
pelvic lymph nodes.

Reproductive: Status post hysterectomy. No adnexal masses.

Other: No abdominal wall hernia or abnormality. No abdominopelvic
ascites.

Musculoskeletal: No acute or significant osseous findings.
IMPRESSION: 1. Evidence of prior cholecystectomy and prior hysterectomy.
2. No acute or active process within the abdomen or pelvis.

## 2021-02-28 MED ORDER — LACTATED RINGERS IV SOLN: INTRAVENOUS | Status: DC

## 2021-02-28 MED ORDER — INSULIN REGULAR(HUMAN) IN NACL 100-0.9 UT/100ML-% IV SOLN
INTRAVENOUS | Status: DC
  Administered 2021-02-28: 11.5 [IU]/h via INTRAVENOUS
  Filled 2021-02-28: qty 100

## 2021-02-28 MED ORDER — ONDANSETRON HCL 4 MG/2ML IJ SOLN
4.0000 mg | Freq: Once | INTRAMUSCULAR | Status: AC
  Administered 2021-02-28: 4 mg via INTRAVENOUS
  Filled 2021-02-28: qty 2

## 2021-02-28 MED ORDER — MORPHINE SULFATE (PF) 4 MG/ML IV SOLN
4.0000 mg | Freq: Once | INTRAVENOUS | Status: AC
Start: 2021-02-28 — End: 2021-02-28
  Administered 2021-02-28: 4 mg via INTRAVENOUS
  Filled 2021-02-28: qty 1

## 2021-02-28 MED ORDER — LACTATED RINGERS IV BOLUS
20.0000 mL/kg | Freq: Once | INTRAVENOUS | Status: AC
Start: 2021-02-28 — End: 2021-02-28
  Administered 2021-02-28: 1000 mL via INTRAVENOUS

## 2021-02-28 MED ORDER — SODIUM CHLORIDE 0.9 % IV BOLUS
1000.0000 mL | Freq: Once | INTRAVENOUS | Status: AC
  Administered 2021-02-28: 1000 mL via INTRAVENOUS

## 2021-02-28 MED ORDER — DEXTROSE IN LACTATED RINGERS 5 % IV SOLN: INTRAVENOUS | Status: DC

## 2021-02-28 MED ORDER — POTASSIUM CHLORIDE 10 MEQ/100ML IV SOLN
10.0000 meq | INTRAVENOUS | Status: AC
  Administered 2021-02-28 (×2): 10 meq via INTRAVENOUS
  Filled 2021-02-28 (×2): qty 100

## 2021-02-28 MED ORDER — IOHEXOL 9 MG/ML PO SOLN
ORAL | Status: AC
  Administered 2021-02-28: 500 mL
  Filled 2021-02-28: qty 1000

## 2021-02-28 MED ORDER — DEXTROSE 50 % IV SOLN: 0.0000 mL | INTRAVENOUS | Status: DC | PRN

## 2021-02-28 NOTE — ED Provider Notes (Signed)
[facility] EMERGENCY DEPARTMENT Provider Note   CSN: [id number] Arrival date & time: 02/28/21  1541     History Chief Complaint  Patient presents with   Abdominal Pain    Robin [name] is a 55 y.o. female history of atrial fibrillation, CHF, COPD, type 2 diabetes, hypertension, expressive aphasia and right sided weakness secondary to CVA and history of cocaine abuse presenting for evaluation of right sided abdominal pain which started this morning shortly after waking.  She reports mild nausea without emesis, also denies diarrhea, constipation or dysuria.  Patient is difficult to understand but does respond appropriately when asked questions in yes no format.  She denies fevers.  She does report right sided back pain and endorses that she has had a kidney stone in the past.  She denies hematuria.  Has had no medications for her symptoms prior to arrival.  Her pain is constant.  The history is provided by the patient.      Past Medical History:  Diagnosis Date   A-fib [facility])    Abdominal aortic aneurysm (AAA) (HCC)    Allergy    Anemia    Arthritis    Atrial fibrillation (HCC)    CHF (congestive heart failure) ([place])    a. EF at 30-35% by echo in 08/2018 b. EF at 35% by repeat echo in 05/2019   Chronic abdominal pain    Chronic atrial fibrillation (HCC)    Cocaine abuse ([place])    COPD (chronic obstructive pulmonary disease) ([place])    Diabetes mellitus without complication ([place])    Essential hypertension, benign    Expressive aphasia    Expressive aphasia    post CVA   Fatty liver    GERD (gastroesophageal reflux disease)    Gout 2016   Normal coronary arteries    3/10 - following abnormal Myoview   Ovarian cyst    Stroke ([place]) 12/26/2019   Right sided weakness, and expressive aphasia   Stroke [facility])    Thoracic ascending aortic aneurysm (HCC)    4 cm 10/31/19 CTA   Type 2 diabetes mellitus ([place])    type II    Patient Active Problem List   Diagnosis Date Noted    Acute metabolic encephalopathy 14/97/0263   Hyperglycemia 12/31/2020   Prolonged QT interval 05/12/2020   Atrial fibrillation, chronic ([place]) 05/12/2020   Right hemiparesis ([place])    Essential hypertension    Chronic diastolic congestive heart failure ([place])    Dysphagia due to recent stroke 01/05/2020   Aneurysm of right carotid artery ([place]) paraclinoid 78/58/8502   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/p clot retrieval 12/27/2019   Thoracic aortic aneurysm without rupture ([place])    Type 2 diabetes mellitus with diabetic autonomic neuropathy, with long-term current use of insulin ([place]) 09/01/2018   Cocaine abuse ([place]) 08/25/2018   Cigarette nicotine dependence, uncomplicated 77/41/2878   GERD (gastroesophageal reflux disease) 06/07/2011   COPD (chronic obstructive pulmonary disease) ([place]) 06/07/2011   Arthritis 06/07/2011    Past Surgical History:  Procedure Laterality Date   ABDOMINAL HYSTERECTOMY  09/10/2011   Procedure: HYSTERECTOMY ABDOMINAL;  Surgeon: Jonnie Kind, MD;  Location: [facility];  Service: Gynecology;  Laterality: N/A;  Abdominal hysterectomy   CESAREAN SECTION  [id number], and [place]   IR 3D INDEPENDENT WKST  03/16/2020   IR ANGIO INTRA EXTRACRAN SEL INTERNAL CAROTID BILAT MOD SED  03/16/2020   IR ANGIO VERTEBRAL SEL SUBCLAVIAN INNOMINATE UNI L MOD SED  03/16/2020  IR ANGIO VERTEBRAL SEL VERTEBRAL UNI R MOD SED  03/16/2020   IR CT HEAD LTD  12/27/2019   IR PERCUTANEOUS ART THROMBECTOMY/INFUSION INTRACRANIAL INC DIAG ANGIO  12/27/2019   IR US GUIDE VASC ACCESS RIGHT  12/27/2019   IR US GUIDE VASC ACCESS RIGHT  03/16/2020   RADIOLOGY WITH ANESTHESIA N/A 12/27/2019   Procedure: IR WITH ANESTHESIA;  Surgeon: Luanne Bras, MD;  Location: [facility];  Service: Radiology;  Laterality: N/A;   SCAR REVISION  09/10/2011   Procedure: SCAR REVISION;  Surgeon: Jonnie Kind, MD;  Location: [facility];  Service: Gynecology;  Laterality: N/A;  Wide Excision of old Cicatrix    TUBAL LIGATION  1994     OB History     Gravida  3   Para  0   Term  0   Preterm  0   AB  0   Living         SAB  0   IAB  0   Ectopic  0   Multiple      Live Births              Family History  Problem Relation Age of Onset   Cirrhosis Mother    Early death Mother    Alcohol abuse Mother    Diabetes type II Father    Alcohol abuse Father    Diabetes type II Sister    Early death Brother    Alcohol abuse Son    Anesthesia problems Neg Hx    Hypotension Neg Hx    Malignant hyperthermia Neg Hx    Pseudochol deficiency Neg Hx    Colon cancer Neg Hx    Colon polyps Neg Hx    Esophageal cancer Neg Hx    Rectal cancer Neg Hx    Stomach cancer Neg Hx     Social History   Tobacco Use   Smoking status: Former    Years: 29.00    Types: Cigarettes    Quit date: 12/2019    Years since quitting: 1.2   Smokeless tobacco: Never  Vaping Use   Vaping Use: Never used  Substance Use Topics   Alcohol use: Never    Comment: occ   Drug use: Never    Types: Cocaine, Marijuana    Home Medications Prior to Admission medications   Medication Sig Start Date End Date Taking? Authorizing Provider  acetaminophen (TYLENOL) 325 MG tablet Take 650 mg by mouth every 8 (eight) hours as needed for mild pain.    [provider]  apixaban (ELIQUIS) 5 MG TABS tablet Take 1 tablet (5 mg total) by mouth 2 (two) times daily. 07/19/20   Strader, Fransisco Hertz, PA-C  aspirin EC 81 MG tablet Take 1 tablet (81 mg total) by mouth daily. Swallow whole. 07/19/20   Strader, Fransisco Hertz, PA-C  BD PEN NEEDLE NANO U/F 32G X 4 MM MISC Inject into the skin as directed. 02/01/20   [provider]  carvedilol (COREG) 25 MG tablet Take 2 tablets (50 mg total) by mouth 2 (two) times daily with a meal. 01/02/21 02/01/21  Johnson, Clanford L, MD  furosemide (LASIX) 20 MG tablet Take 1 tablet (20 mg total) by mouth daily. 07/10/20 07/10/21  DanfordSuann Larry, MD  gabapentin  (NEURONTIN) 100 MG capsule Take 1 capsule (100 mg total) by mouth 3 (three) times daily. 02/16/20   Inda Coke, PA  hydrALAZINE (APRESOLINE) 25 MG tablet Take 1 tablet (25 mg total)  by mouth 3 (three) times daily. 02/29/20 02/28/21  Jarold Motto, PA  insulin glargine (LANTUS) 100 UNIT/ML injection Inject 18-20 Units into the skin at bedtime. If over 250 give another 2 units    [provider]  isosorbide mononitrate (IMDUR) 30 MG 24 hr tablet Take 2 tablets (60 mg total) by mouth in the morning and at bedtime. 01/02/21 01/02/22  Johnson, Clanford L, MD  nitroGLYCERIN (NITROSTAT) 0.4 MG SL tablet Place 1 tablet (0.4 mg total) under the tongue every 5 (five) minutes as needed for chest pain. 02/01/20   Angiulli, Mcarthur Rossetti, PA-C  pantoprazole (PROTONIX) 40 MG tablet Take 1 tablet by mouth once daily Patient taking differently: Take 40 mg by mouth daily. 12/14/20   Ardith Dark, MD    Allergies    Bee venom, Losartan, Naproxen, Penicillins, and Lisinopril  Review of Systems   Review of Systems  Constitutional:  Negative for chills and fever.  HENT:  Negative for congestion and sore throat.   Eyes: Negative.   Respiratory:  Negative for chest tightness and shortness of breath.   Cardiovascular:  Negative for chest pain.  Gastrointestinal:  Positive for abdominal pain and nausea. Negative for constipation, diarrhea and vomiting.  Genitourinary:  Positive for flank pain. Negative for dysuria.  Musculoskeletal:  Negative for arthralgias, joint swelling and neck pain.  Skin: Negative.  Negative for rash and wound.  Neurological:  Negative for dizziness, weakness, light-headedness, numbness and headaches.  Psychiatric/Behavioral: Negative.     Physical Exam Updated Vital Signs BP 129/77   Pulse 99   Temp 98.6 F (37 C) (Oral)   Resp (!) 23   Ht 5\' 7"  (1.702 m)   Wt 100 kg   LMP 08/21/2011   SpO2 98%   BMI 34.53 kg/m   Physical Exam Vitals and nursing note reviewed.   Constitutional:      Appearance: She is well-developed.  HENT:     Head: Normocephalic and atraumatic.  Eyes:     Conjunctiva/sclera: Conjunctivae normal.  Cardiovascular:     Rate and Rhythm: Normal rate and regular rhythm.     Heart sounds: Normal heart sounds.  Pulmonary:     Effort: Pulmonary effort is normal.     Breath sounds: Normal breath sounds. No wheezing.  Abdominal:     General: Bowel sounds are normal.     Palpations: Abdomen is soft.     Tenderness: There is abdominal tenderness in the right upper quadrant and right lower quadrant. There is guarding. There is no right CVA tenderness or left CVA tenderness. Positive signs include McBurney's sign.  Musculoskeletal:        General: Normal range of motion.     Cervical back: Normal range of motion.  Skin:    General: Skin is warm and dry.  Neurological:     Mental Status: She is alert.    ED Results / Procedures / Treatments   Labs (all labs ordered are listed, but only abnormal results are displayed) Labs Reviewed  CBC WITH DIFFERENTIAL/PLATELET - Abnormal; Notable for the following components:      Result Value   MCV 78.3 (*)    MCH 24.8 (*)    All other components within normal limits  COMPREHENSIVE METABOLIC PANEL - Abnormal; Notable for the following components:   Sodium 122 (*)    Chloride 92 (*)    Glucose, Bld 717 (*)    BUN 24 (*)    Creatinine, Ser 1.99 (*)  Calcium 8.4 (*)    Albumin 3.4 (*)    GFR, Estimated 29 (*)    All other components within normal limits  URINALYSIS, ROUTINE W REFLEX MICROSCOPIC - Abnormal; Notable for the following components:   Color, Urine STRAW (*)    Glucose, UA >=500 (*)    All other components within normal limits  OSMOLALITY - Abnormal; Notable for the following components:   Osmolality 313 (*)    All other components within normal limits  BASIC METABOLIC PANEL - Abnormal; Notable for the following components:   Sodium 133 (*)    Glucose, Bld 350 (*)    BUN  21 (*)    Creatinine, Ser 1.59 (*)    Calcium 8.7 (*)    GFR, Estimated 38 (*)    All other components within normal limits  CBG MONITORING, ED - Abnormal; Notable for the following components:   Glucose-Capillary 551 (*)    All other components within normal limits  CBG MONITORING, ED - Abnormal; Notable for the following components:   Glucose-Capillary 482 (*)    All other components within normal limits  CBG MONITORING, ED - Abnormal; Notable for the following components:   Glucose-Capillary 433 (*)    All other components within normal limits  CBG MONITORING, ED - Abnormal; Notable for the following components:   Glucose-Capillary 419 (*)    All other components within normal limits  CBG MONITORING, ED - Abnormal; Notable for the following components:   Glucose-Capillary 346 (*)    All other components within normal limits  CBG MONITORING, ED - Abnormal; Notable for the following components:   Glucose-Capillary 314 (*)    All other components within normal limits  CBG MONITORING, ED - Abnormal; Notable for the following components:   Glucose-Capillary 269 (*)    All other components within normal limits  CBG MONITORING, ED - Abnormal; Notable for the following components:   Glucose-Capillary 222 (*)    All other components within normal limits  LIPASE, BLOOD  BETA-HYDROXYBUTYRIC ACID  BETA-HYDROXYBUTYRIC ACID  BETA-HYDROXYBUTYRIC ACID  POC URINE PREG, ED    EKG EKG Interpretation  Date/Time:  Wednesday February 28 2021 23:41:57 EDT Ventricular Rate:  97 PR Interval:    QRS Duration: 76 QT Interval:  382 QTC Calculation: 485 R Axis:   95 Text Interpretation: Atrial fibrillation Rightward axis Low voltage QRS Septal infarct , age undetermined Abnormal ECG No significant change since prior ecg Confirmed by Veryl Speak [phone]) on 02/28/2021 11:47:51 PM  Radiology CT ABDOMEN PELVIS WO CONTRAST  Result Date: 02/28/2021 CLINICAL DATA:  Right-sided abdominal pain. EXAM: CT  ABDOMEN AND PELVIS WITHOUT CONTRAST TECHNIQUE: Multidetector CT imaging of the abdomen and pelvis was performed following the standard protocol without IV contrast. COMPARISON:  November 13, 2020 FINDINGS: Lower chest: No acute abnormality. Hepatobiliary: No focal liver abnormality is seen. Status post cholecystectomy. No biliary dilatation. Pancreas: Unremarkable. No pancreatic ductal dilatation or surrounding inflammatory changes. Spleen: Normal in size without focal abnormality. Adrenals/Urinary Tract: Adrenal glands are unremarkable. Kidneys are normal, without renal calculi, focal lesion, or hydronephrosis. Bladder is unremarkable. Stomach/Bowel: Stomach is within normal limits. Appendix appears normal. Stool is seen throughout the large bowel. No evidence of bowel wall thickening, distention, or inflammatory changes. Vascular/Lymphatic: Aortic atherosclerosis. No enlarged abdominal or pelvic lymph nodes. Reproductive: Status post hysterectomy. No adnexal masses. Other: No abdominal wall hernia or abnormality. No abdominopelvic ascites. Musculoskeletal: No acute or significant osseous findings. IMPRESSION: 1. Evidence of prior cholecystectomy and prior  hysterectomy. 2. No acute or active process within the abdomen or pelvis. Electronically Signed   By: Aram Candela M.D.   On: 02/28/2021 22:06    Procedures Procedures   Medications Ordered in ED Medications  insulin regular, human (MYXREDLIN) 100 units/ 100 mL infusion ( Intravenous Stopped 02/28/21 2348)  lactated ringers infusion ( Intravenous Stopped 02/28/21 2315)  dextrose 5 % in lactated ringers infusion ( Intravenous Stopped 02/28/21 2348)  dextrose 50 % solution 0-50 mL (has no administration in time range)  sodium chloride 0.9 % bolus 1,000 mL (0 mLs Intravenous Stopped 02/28/21 1942)  morphine 4 MG/ML injection 4 mg (4 mg Intravenous Given 02/28/21 1813)  ondansetron (ZOFRAN) injection 4 mg (4 mg Intravenous Given 02/28/21 1812)  lactated  ringers bolus 20 mL/kg (0 mLs Intravenous Stopped 02/28/21 2057)  potassium chloride 10 mEq in 100 mL IVPB (0 mEq Intravenous Stopped 02/28/21 2108)  iohexol (OMNIPAQUE) 9 MG/ML oral solution (500 mLs  Contrast Given 02/28/21 1900)    ED Course  I have reviewed the triage vital signs and the nursing notes.  Pertinent labs & imaging results that were available during my care of the patient were reviewed by me and considered in my medical decision making (see chart for details).    MDM Rules/Calculators/A&P                             Patient with abdominal pain and significant hyperglycemia, initially presenting with a CBG of 717 without anion gap or acidosis.  She was treated with IV insulin and fluids.  She also underwent CT imaging of her abdomen which was negative for any acute intra-abdominal processes including obstruction or infection, specifically she does not have acute appendicitis, her abdominal pain was resolved by the time of discharge home.  Additionally her discharge glucose was 222.  There was no signs of any infection as the source of her elevated glucose levels.  UA is negative except for glucose.  She does have reduced creatinine level at 1.59 but this is stable in comparison to prior labs.  Her beta hydroxybutyrate acid was normal range.  Her WBC count is normal at 7.5.  She was stable and felt safe for discharge home with her response to treatment.  Her initial sodium level was 122, however corrected for her glucose was 132.  Repeat be met prior to discharge home was reassuring with a sodium of 133.  Sister is now at the bedside prior to discharge home and expressed some concerns with patient's ability to care for herself at home and doubts whether she is taking her home medications including her Lantus.  Since patient had a stroke she has been alternating between staying with the daughter and a good friend.  She states that until the past several weeks she was undergoing home  PT/OT and speech-language therapy but this treatment has been completed.  She is requesting assistance for home, particularly to ensure she is taking her medications properly and as prescribed by her PCP.  A face-to-face social work/home health consult has been ordered.  The patient appears reasonably screened and/or stabilized for discharge and I doubt any other medical condition or other [facility] requiring further screening, evaluation, or treatment in the ED at this time prior to discharge.    Final Clinical Impression(s) / ED Diagnoses Final diagnoses:  Hyperglycemia  Right lower quadrant abdominal pain    Rx / DC Orders ED Discharge Orders  Ordered    Home Health       Comments: Pt is recovering from a stroke and had undergone home PT/OT/SLP.   Presents today with extremely elevated blood glucose level. Treated and sent home, but there is concern for her home care needs and assistance with home medications.    Evaluation for home health assistance is requested.   02/28/21 2327    Face-to-face encounter (required for Medicare/Medicaid patients)       Comments: I Evalee Jefferson certify that this patient is under my care and that I, or a nurse practitioner or physician's assistant working with me, had a face-to-face encounter that meets the physician face-to-face encounter requirements with this patient on 02/28/2021. The encounter with the patient was in whole, or in part for the following medical condition(s) which is the primary reason for home health care (List medical condition): hyperglycemia   02/28/21 2327             Evalee Jefferson, PA-C 03/01/21 0016    Daleen Bo, MD 03/01/21 1145

## 2021-02-28 NOTE — ED Notes (Signed)
Critical BG 717, [place] PA notified

## 2021-02-28 NOTE — ED Notes (Signed)
Pt resting comfortably, sister at bedside

## 2021-02-28 NOTE — ED Notes (Signed)
Pt here from home with extensive hx including diabetes, afib, cva with right sided deficit. Pt communication intermittently jumbled relating to hx of stroke

## 2021-02-28 NOTE — Discharge Instructions (Signed)
Your CT scan is negative today for any acute findings.  As discussed your blood sugar was extremely elevated today but it is improved with the treatment you received here.  It is very important to make sure that you are taking your Lantus every day without missing any doses.  At your request we have ordered a home health evaluation to see if any further assistance can be given to you in your home for your healthcare needs.  Expect a call from somebody with this department within the next 24 hours.  In the interim return here if you develop any new or worsening symptoms.

## 2021-02-28 NOTE — ED Triage Notes (Signed)
Pt c/o severe right side abdominal pain that started this am; denies n/v/d

## 2021-02-28 NOTE — ED Notes (Signed)
Pt put on purewick.

## 2021-03-02 ENCOUNTER — Telehealth: Payer: Self-pay

## 2021-03-02 DIAGNOSIS — R2689 Other abnormalities of gait and mobility: Secondary | ICD-10-CM

## 2021-03-02 DIAGNOSIS — R4182 Altered mental status, unspecified: Secondary | ICD-10-CM

## 2021-03-02 DIAGNOSIS — R410 Disorientation, unspecified: Secondary | ICD-10-CM

## 2021-03-02 NOTE — Telephone Encounter (Signed)
Transition Care Management Unsuccessful Follow-up Telephone Call  Date of discharge and from where:  03/01/2021[facility]  Attempts:  1st Attempt  Reason for unsuccessful TCM follow-up call:  Left voice message

## 2021-03-05 NOTE — Telephone Encounter (Signed)
Transition Care Management Follow-up Telephone Call Date of discharge and from where: 03/01/2021-[facility] ED  How have you been since you were released from the hospital? Patient is still having some abdominal issues.  Any questions or concerns? No  Items Reviewed: Did the pt receive and understand the discharge instructions provided? Yes  Medications obtained and verified?  No medications given at discharge  Other? No  Any new allergies since your discharge? No  Dietary orders reviewed? N/A Do you have support at home? Yes   Home Care and Equipment/Supplies: Were home health services ordered? not applicable If so, what is the name of the agency? N/A  Has the agency set up a time to come to the patient's home? not applicable Were any new equipment or medical supplies ordered?  No What is the name of the medical supply agency? N/A Were you able to get the supplies/equipment? not applicable Do you have any questions related to the use of the equipment or supplies? No  Functional Questionnaire: (I = Independent and D = Dependent) ADLs: D  Bathing/Dressing- D  Meal Prep- D  Eating- D  Maintaining continence- D  Transferring/Ambulation- D  Managing Meds- D  Follow up appointments reviewed:  PCP Hospital f/u appt confirmed? No   Specialist Hospital f/u appt confirmed? Yes  Scheduled to see Dr.Shamleffer on 03/09/2021 @ 10:10 am. Are transportation arrangements needed? No  If their condition worsens, is the pt aware to call PCP or go to the Emergency Dept.? Yes Was the patient provided with contact information for the PCP's office or ED? Yes Was to pt encouraged to call back with questions or concerns? Yes

## 2021-03-07 ENCOUNTER — Telehealth: Payer: Self-pay

## 2021-03-07 NOTE — Chronic Care Management (AMB) (Signed)
  Care Management   Outreach Note  03/07/2021 Name: Robin [name] MRN: [id number] DOB: [birth year]  Referred by: Inda Coke, [place] Reason for referral : Chronic Care Management (Outreach to schedule referral with LCSW)   An unsuccessful telephone outreach was attempted today. The patient was referred to the case management team for assistance with care management and care coordination.   Follow Up Plan: A HIPAA compliant phone message was left for the patient providing contact information and requesting a return call.  The care management team will reach out to the patient again over the next 5 days.  If patient returns call to provider office, please advise to call [facility]  at [place], [place], [place], [place] [address] Direct Dial: [phone] [email or web address] Website: [email or web address]

## 2021-03-07 NOTE — Progress Notes (Signed)
Spoke with Sister [name], Jeralene Huff out to patient about scheduling with LCSW. Left message for patient to return call  Noreene Larsson, [address], [place], [place] [address] Direct Dial: [phone] [email or web address]: [email or web address]

## 2021-03-09 ENCOUNTER — Ambulatory Visit (INDEPENDENT_AMBULATORY_CARE_PROVIDER_SITE_OTHER): Payer: 59 | Admitting: Internal Medicine

## 2021-03-09 ENCOUNTER — Encounter: Payer: Self-pay | Admitting: Internal Medicine

## 2021-03-09 ENCOUNTER — Other Ambulatory Visit: Payer: Self-pay

## 2021-03-09 VITALS — BP 132/86 | HR 67 | Ht 67.0 in | Wt 223.8 lb

## 2021-03-09 DIAGNOSIS — E1143 Type 2 diabetes mellitus with diabetic autonomic (poly)neuropathy: Secondary | ICD-10-CM | POA: Diagnosis not present

## 2021-03-09 DIAGNOSIS — E1165 Type 2 diabetes mellitus with hyperglycemia: Secondary | ICD-10-CM | POA: Diagnosis not present

## 2021-03-09 DIAGNOSIS — E1122 Type 2 diabetes mellitus with diabetic chronic kidney disease: Secondary | ICD-10-CM

## 2021-03-09 DIAGNOSIS — Z794 Long term (current) use of insulin: Secondary | ICD-10-CM | POA: Diagnosis not present

## 2021-03-09 DIAGNOSIS — E1159 Type 2 diabetes mellitus with other circulatory complications: Secondary | ICD-10-CM | POA: Diagnosis not present

## 2021-03-09 DIAGNOSIS — E119 Type 2 diabetes mellitus without complications: Secondary | ICD-10-CM | POA: Insufficient documentation

## 2021-03-09 DIAGNOSIS — N1831 Chronic kidney disease, stage 3a: Secondary | ICD-10-CM

## 2021-03-09 LAB — POCT GLYCOSYLATED HEMOGLOBIN (HGB A1C): HbA1c POC (<> result, manual entry): >15 % (ref 4.0–5.6)

## 2021-03-09 MED ORDER — LANTUS SOLOSTAR 100 UNIT/ML ~~LOC~~ SOPN: 25.0000 [IU] | PEN_INJECTOR | Freq: Every day | SUBCUTANEOUS | 3 refills | Status: DC

## 2021-03-09 MED ORDER — DEXCOM G6 TRANSMITTER MISC: 1.0000 | 3 refills | Status: DC

## 2021-03-09 MED ORDER — BD PEN NEEDLE NANO U/F 32G X 4 MM MISC: 1.0000 | Freq: Four times a day (QID) | 3 refills | Status: DC

## 2021-03-09 MED ORDER — ONETOUCH VERIO VI STRP: 1.0000 | ORAL_STRIP | Freq: Three times a day (TID) | 3 refills | Status: DC

## 2021-03-09 MED ORDER — ACCU-CHEK GUIDE ME W/DEVICE KIT: 1.0000 | PACK | Freq: Three times a day (TID) | 0 refills | Status: AC

## 2021-03-09 MED ORDER — ACCU-CHEK GUIDE VI STRP: 1.0000 | ORAL_STRIP | Freq: Three times a day (TID) | 3 refills | Status: DC

## 2021-03-09 MED ORDER — DEXCOM G6 SENSOR MISC: 1.0000 | 3 refills | Status: DC

## 2021-03-09 MED ORDER — DEXCOM G6 RECEIVER DEVI: 1.0000 | 0 refills | Status: DC

## 2021-03-09 MED ORDER — NOVOLOG FLEXPEN 100 UNIT/ML ~~LOC~~ SOPN: 8.0000 [IU] | PEN_INJECTOR | Freq: Three times a day (TID) | SUBCUTANEOUS | 3 refills | Status: DC

## 2021-03-09 NOTE — Patient Instructions (Signed)
-   Increase Lantus to 25 units daily  - Start Novolog 8 units with Breakfast, Lunch and Dinner       HOW TO TREAT LOW BLOOD SUGARS (Blood sugar LESS THAN 70 MG/DL) Please follow the RULE OF 15 for the treatment of hypoglycemia treatment (when your (blood sugars are less than 70 mg/dL)   STEP 1: Take 15 grams of carbohydrates when your blood sugar is low, which includes:  3-4 GLUCOSE TABS  OR 3-4 OZ OF JUICE OR REGULAR SODA OR ONE TUBE OF GLUCOSE GEL    STEP 2: RECHECK blood sugar in 15 MINUTES STEP 3: If your blood sugar is still low at the 15 minute recheck --> then, go back to STEP 1 and treat AGAIN with another 15 grams of carbohydrates.

## 2021-03-09 NOTE — Progress Notes (Signed)
Name: Robin [name]  Age/ Sex: 55 y.o., female   MRN/ DOB: [id number], 1965-10-16     PCP: Inda Coke, PA   Reason for Endocrinology Evaluation: Type 2 Diabetes Mellitus  Initial Endocrine Consultative Visit: 10/01/2018    PATIENT IDENTIFIER: Robin [name] is a 55 y.o. female with a past medical history of T2DM, CHF, COPD , CVA, A.Fib with RVR and GERD. The patient has followed with Endocrinology clinic since 10/01/2018 for consultative assistance with management of her diabetes.  DIABETIC HISTORY:  Ms. Brazzel was diagnosed with DM > 20 yrs ago. Has been on Janumet and Glipizide in the past. Has been on insulin for years. Her hemoglobin A1c has ranged from 7.9% in 2017, peaking at 11.3% in 7209  Started trulicity 4709  SUBJECTIVE:   During the last visit (09/13/2019): A1c 10.3 % ,we continued metformin and increase Novolog mix.   Today (03/09/2021): Robin [name] is here for a follow up on diabetes management.She has NOT been to our clinic in 18 months.    Since that time she has been to the ED multiple times for variable reasons to include hypokalemia, CVA, HTN, A.fib with RVR, PNA and hyperglycemia    She is accompanied by her sister   Has right side weakness and difficulty with speech  She lives with daughter but is not getting a lot of help Has been having occasional abdominal pain  Denies vomiting or diarrhea  She checks her sugars 0 times a day.    HOME DIABETES REGIMEN:  Metformin 1000 mg twice a day - not taking  Novolog mix 30 units BID- not taking  Trulicity - not taking   Lantus     METER DOWNLOAD SUMMARY: Unable to download 167-314 mg/dL    DIABETIC COMPLICATIONS: Microvascular complications:  Neuropathy Denies: CKD, retinopathy  Last eye exam: Completed many years ago    Macrovascular complications:  CVA 01/2835 Denies: CAD, PVA    HISTORY:  Past Medical History:  Past Medical History:  Diagnosis Date  . A-fib ([place])   . Abdominal  aortic aneurysm (AAA) ([place])   . Allergy   . Anemia   . Arthritis   . Atrial fibrillation ([place])   . CHF (congestive heart failure) ([place])    a. EF at 30-35% by echo in 08/2018 b. EF at 35% by repeat echo in 05/2019  . Chronic abdominal pain   . Chronic atrial fibrillation ([place])   . Cocaine abuse ([place])   . COPD (chronic obstructive pulmonary disease) ([place])   . Diabetes mellitus without complication ([place])   . Essential hypertension, benign   . Expressive aphasia   . Expressive aphasia    post CVA  . Fatty liver   . GERD (gastroesophageal reflux disease)   . Gout 2016  . Normal coronary arteries    3/10 - following abnormal Myoview  . Ovarian cyst   . Stroke ([place]) 12/26/2019   Right sided weakness, and expressive aphasia  . Stroke ([place])   . Thoracic ascending aortic aneurysm (H[facility])    4 cm 10/31/19 CTA  . Type 2 diabetes mellitus ([place])    type II   Past Surgical History:  Past Surgical History:  Procedure Laterality Date  . ABDOMINAL HYSTERECTOMY  09/10/2011   Procedure: HYSTERECTOMY ABDOMINAL;  Surgeon: Jonnie Kind, MD;  Location: [facility];  Service: Gynecology;  Laterality: N/A;  Abdominal hysterectomy  . CESAREAN SECTION  [id number], and 1994  . CHOLECYSTECTOMY  1995  . IR  3D INDEPENDENT WKST  03/16/2020  . IR ANGIO INTRA EXTRACRAN SEL INTERNAL CAROTID BILAT MOD SED  03/16/2020  . IR ANGIO VERTEBRAL SEL SUBCLAVIAN INNOMINATE UNI L MOD SED  03/16/2020  . IR ANGIO VERTEBRAL SEL VERTEBRAL UNI R MOD SED  03/16/2020  . IR CT HEAD LTD  12/27/2019  . IR PERCUTANEOUS ART THROMBECTOMY/INFUSION INTRACRANIAL INC DIAG ANGIO  12/27/2019  . IR US GUIDE VASC ACCESS RIGHT  12/27/2019  . IR US GUIDE VASC ACCESS RIGHT  03/16/2020  . RADIOLOGY WITH ANESTHESIA N/A 12/27/2019   Procedure: IR WITH ANESTHESIA;  Surgeon: Luanne Bras, MD;  Location: [facility];  Service: Radiology;  Laterality: N/A;  . SCAR REVISION  09/10/2011   Procedure: SCAR REVISION;  Surgeon: Jonnie Kind, MD;  Location: [facility];   Service: Gynecology;  Laterality: N/A;  Wide Excision of old Cicatrix  . TUBAL LIGATION  1994   Social History:  reports that she quit smoking about 15 months ago. Her smoking use included cigarettes. She has never used smokeless tobacco. She reports that she does not drink alcohol and does not use drugs. Family History:  Family History  Problem Relation Age of Onset  . Cirrhosis Mother   . Early death Mother   . Alcohol abuse Mother   . Diabetes type II Father   . Alcohol abuse Father   . Diabetes type II Sister   . Early death Brother   . Alcohol abuse Son   . Anesthesia problems Neg Hx   . Hypotension Neg Hx   . Malignant hyperthermia Neg Hx   . Pseudochol deficiency Neg Hx   . Colon cancer Neg Hx   . Colon polyps Neg Hx   . Esophageal cancer Neg Hx   . Rectal cancer Neg Hx   . Stomach cancer Neg Hx      HOME MEDICATIONS: Allergies as of 03/09/2021       Reactions   Bee Venom Shortness Of Breath, Swelling   Bodily Swelling   Losartan Other (See Comments)   Nosebleeds per patient report.    Naproxen Other (See Comments)   Acid reflux   Penicillins Nausea Only   Has patient had a PCN reaction causing immediate rash, facial/tongue/throat swelling, SOB or lightheadedness with hypotension: no Has patient had a PCN reaction causing severe rash involving mucus membranes or skin necrosis: no Has patient had a PCN reaction that required hospitalization no Has patient had a PCN reaction occurring within the last 10 years: no If all of the above answers are "NO", then may proceed with Cephalosporin    Lisinopril    Sinus congestion        Medication List        Accurate as of March 09, 2021  5:17 PM. If you have any questions, ask your nurse or doctor.          STOP taking these medications    acetaminophen 325 MG tablet Commonly known as: TYLENOL Stopped by: Dorita Sciara, MD   insulin glargine 100 UNIT/ML injection Commonly known as: LANTUS Replaced  by: Lantus SoloStar 100 UNIT/ML Solostar Pen Stopped by: Dorita Sciara, MD       TAKE these medications    Accu-Chek Guide Me w/Device Kit 1 Device by Does not apply route 3 (three) times daily. Started by: Dorita Sciara, MD   Accu-Chek Guide test strip Generic drug: glucose blood 1 each by Other route 3 (three) times daily. Use as instructed Started by:  Dorita Sciara, MD   apixaban 5 MG Tabs tablet Commonly known as: Eliquis Take 1 tablet (5 mg total) by mouth 2 (two) times daily.   aspirin EC 81 MG tablet Take 1 tablet (81 mg total) by mouth daily. Swallow whole.   BD Pen Needle Nano U/F 32G X 4 MM Misc Generic drug: Insulin Pen Needle Inject 1 Device into the skin in the morning, at noon, in the evening, and at bedtime. What changed:  how much to take when to take this Changed by: Dorita Sciara, MD   carvedilol 25 MG tablet Commonly known as: COREG Take 2 tablets (50 mg total) by mouth 2 (two) times daily with a meal.   Dexcom G6 Receiver Devi 1 Device by Does not apply route as directed. Started by: Dorita Sciara, MD   Dexcom G6 Sensor Misc 1 Device by Does not apply route as directed. Started by: Dorita Sciara, MD   Dexcom G6 Transmitter Misc 1 Device by Does not apply route as directed. Started by: Dorita Sciara, MD   furosemide 20 MG tablet Commonly known as: Lasix Take 1 tablet (20 mg total) by mouth daily.   gabapentin 100 MG capsule Commonly known as: NEURONTIN Take 1 capsule (100 mg total) by mouth 3 (three) times daily.   hydrALAZINE 25 MG tablet Commonly known as: APRESOLINE Take 1 tablet (25 mg total) by mouth 3 (three) times daily.   isosorbide mononitrate 30 MG 24 hr tablet Commonly known as: IMDUR Take 2 tablets (60 mg total) by mouth in the morning and at bedtime.   Lantus SoloStar 100 UNIT/ML Solostar Pen Generic drug: insulin glargine Inject 25 Units into the skin daily. Replaces:  insulin glargine 100 UNIT/ML injection Started by: Dorita Sciara, MD   nitroGLYCERIN 0.4 MG SL tablet Commonly known as: NITROSTAT Place 1 tablet (0.4 mg total) under the tongue every 5 (five) minutes as needed for chest pain.   NovoLOG FlexPen 100 UNIT/ML FlexPen Generic drug: insulin aspart Inject 8 Units into the skin 3 (three) times daily with meals. Started by: Dorita Sciara, MD   pantoprazole 40 MG tablet Commonly known as: PROTONIX Take 1 tablet by mouth once daily         OBJECTIVE:   Vital Signs: BP 132/86   Pulse 67   Ht _0  (1.702 m)   Wt 223 lb 12.8 oz (101.5 kg)   LMP 08/21/2011   SpO2 99%   BMI 35.05 kg/m   Wt Readings from Last 3 Encounters:  03/09/21 223 lb 12.8 oz (101.5 kg)  02/28/21 220 lb 7.4 oz (100 kg)  12/31/20 219 lb 12.8 oz (99.7 kg)     Exam: General: Pt appears well and is in NAD  Lungs: Clear with good BS bilat with no rales, rhonchi, or wheezes  Heart: RRR with normal S1 and S2 and no gallops; no murmurs; no rub  Extremities: No pretibial edema.   Neuro: MS is good with appropriate affect, pt is alert and Ox3    DM foot exam:06/09/2019   The skin of the feet is intact without sores or ulcerations. The pedal pulses are undetectable The sensation is intact to a screening 5.07, 10 gram monofilament bilaterally            DATA REVIEWED:  Lab Results  Component Value Date   HGBA1C >15.0 03/09/2021   HGBA1C 7.2 (H) 07/10/2020   HGBA1C 7.9 05/22/2020   Lab Results  Component Value  Date   MICROALBUR 2.0 (H) 06/09/2019   LDLCALC 17 07/05/2020   CREATININE 1.59 (H) 02/28/2021   Lab Results  Component Value Date   MICRALBCREAT 1.0 06/09/2019     Lab Results  Component Value Date   CHOL 58 07/05/2020   HDL 28 (L) 07/05/2020   LDLCALC 17 07/05/2020   TRIG 51 07/05/2020   CHOLHDL 2.1 07/05/2020        Results for TELLY, BROBERG A (MRN [id number]) as of 06/09/2019 13:46  Ref. Range 06/09/2019 08:18   Sodium Latest Ref Range: 135 - 145 mEq/L 140  Potassium Latest Ref Range: 3.5 - 5.1 mEq/L 4.0  Chloride Latest Ref Range: 96 - 112 mEq/L 102  CO2 Latest Ref Range: 19 - 32 mEq/L 30  Glucose Latest Ref Range: 70 - 99 mg/dL 128 (H)  BUN Latest Ref Range: 6 - 23 mg/dL 29 (H)  Creatinine Latest Ref Range: 0.40 - 1.20 mg/dL 1.68 (H)  Calcium Latest Ref Range: 8.4 - 10.5 mg/dL 9.0  GFR Latest Ref Range: >60.00 mL/min 38.58 (L)   ASSESSMENT / PLAN / RECOMMENDATIONS:   1) Type 2 Diabetes Mellitus, Poorly controlled, With Neuropathic and CKD III and macrovascular complications - Most recent A1c of > 15.0 %. Goal A1c < 7.0 %.     -When I saw Ms. Roberto Scales last time which was approximately 18 months ago she was on Trulicity and NovoLog Mix, today her regimen has been changed and she is currently on basal insulin only. -Metformin has been stopped due to low GFR -She does not recall why she has not been on Trulicity -Main barriers to diabetes self care is memory issues and recent CVA - I went over with the patient various strategies as to how she can be reminded to take the medications. -She was able to demonstrate appropriate insulin pen use -We will prescribe Dexcom -I am also going to add prandial insulin -She was also provided with a glucose meter as well as a prescription  MEDICATIONS: Increase Lantus to 25 units daily Start NovoLog 8 units with each meal  EDUCATION / INSTRUCTIONS: BG monitoring instructions: Patient is instructed to check her blood sugars 2 times a day, fasting and supper. Call [facility] if: BG persistently < 70  I reviewed the Rule of 15 for the treatment of hypoglycemia in detail with the patient. Literature supplied.   2) Diabetic complications:  Eye: Does not have known diabetic retinopathy.   Neuro/ Feet: Does  have known diabetic peripheral neuropathy .  Renal: Patient does  have known baseline CKD III       F/U in 3 months      Signed electronically by: Mack Guise, MD  [facility]  [facility] [address] [place], [place] [address] Phone: [phone] FAX: [phone]   CC: Inda Coke, [place] [address] Phone: [phone]  Fax: [phone]  Return to Endocrinology clinic as below: Future Appointments  Date Time Provider [place]  03/13/2021  4:00 PM Inda Coke, [place] [facility]  06/13/2021 10:10 AM Kailynne Ferrington, Melanie Crazier, MD [facility] None

## 2021-03-12 NOTE — Chronic Care Management (AMB) (Signed)
  Chronic Care Management   Outreach Note  03/12/2021 Name: Magaly Mcclure Radloff MRN: [id number] DOB: 05-04-1966  [name] Vise is a 55 y.o. year old female who is a primary care patient of Inda Coke, [place]. I reached out to [employer] by phone today in response to a referral sent by Ms. [name] [place]'s PCP, Inda Coke, [place]      A second unsuccessful telephone outreach was attempted today. The patient was referred to the case management team for assistance with care management and care coordination.   Follow Up Plan: A HIPAA compliant phone message was left for the patient providing contact information and requesting a return call.  The care management team will reach out to the patient again over the next 5 days.  If patient returns call to provider office, please advise to call [facility] * at [phone]*  Noreene Larsson, [place], [facility]  [place], [place] [address] Direct Dial: [phone] [email or web address] Website: [email or web address]

## 2021-03-13 ENCOUNTER — Encounter: Payer: Self-pay | Admitting: Physician Assistant

## 2021-03-13 ENCOUNTER — Ambulatory Visit: Payer: 59 | Admitting: Physician Assistant

## 2021-03-13 ENCOUNTER — Other Ambulatory Visit: Payer: Self-pay

## 2021-03-13 VITALS — BP 120/80 | HR 102 | Temp 98.3°F | Ht 67.0 in | Wt 221.4 lb

## 2021-03-13 DIAGNOSIS — R1010 Upper abdominal pain, unspecified: Secondary | ICD-10-CM

## 2021-03-13 DIAGNOSIS — K219 Gastro-esophageal reflux disease without esophagitis: Secondary | ICD-10-CM

## 2021-03-13 DIAGNOSIS — I482 Chronic atrial fibrillation, unspecified: Secondary | ICD-10-CM | POA: Diagnosis not present

## 2021-03-13 DIAGNOSIS — I10 Essential (primary) hypertension: Secondary | ICD-10-CM | POA: Diagnosis not present

## 2021-03-13 DIAGNOSIS — I5032 Chronic diastolic (congestive) heart failure: Secondary | ICD-10-CM

## 2021-03-13 MED ORDER — PANTOPRAZOLE SODIUM 40 MG PO TBEC: 40.0000 mg | DELAYED_RELEASE_TABLET | Freq: Every day | ORAL | 0 refills | Status: DC

## 2021-03-13 MED ORDER — APIXABAN 5 MG PO TABS: 5.0000 mg | ORAL_TABLET | Freq: Two times a day (BID) | ORAL | 0 refills | Status: DC

## 2021-03-13 MED ORDER — GABAPENTIN 100 MG PO CAPS: 100.0000 mg | ORAL_CAPSULE | Freq: Three times a day (TID) | ORAL | 3 refills | Status: DC

## 2021-03-13 MED ORDER — CARVEDILOL 25 MG PO TABS: 25.0000 mg | ORAL_TABLET | Freq: Two times a day (BID) | ORAL | 0 refills | Status: DC

## 2021-03-13 MED ORDER — ISOSORBIDE MONONITRATE ER 30 MG PO TB24: 60.0000 mg | ORAL_TABLET | Freq: Every day | ORAL | 0 refills | Status: DC

## 2021-03-13 MED ORDER — HYDRALAZINE HCL 25 MG PO TABS: 25.0000 mg | ORAL_TABLET | Freq: Three times a day (TID) | ORAL | 0 refills | Status: DC

## 2021-03-13 MED ORDER — FUROSEMIDE 20 MG PO TABS: 20.0000 mg | ORAL_TABLET | Freq: Every day | ORAL | 0 refills | Status: DC

## 2021-03-13 NOTE — Progress Notes (Signed)
Robin [name] is a 55 y.o. female is here for ED follow up.  I acted as a Education administrator for [employer], PA-C Anselmo Pickler, LPN   History of Present Illness:   Chief Complaint  Patient presents with  . Follow-up    ED     HPI  Abdominal pain Pt was seen in ED 02/28/21 for epigastric and RLQ abdominal pain. Pt still c/o pain, mostly in RUQ. She had normal imaging done. Blood work at [facility] found initial glucose in 700's. Was in 200's at ER discharge.   She continues to have abdominal pain. Mostly now in RUQ. She has normal appetite. Denies: n/v/d. She does feel like she may be constipated. Has infrequent and hard bowel movements. Often has to strain. Denies blood. Remains noncompliant with diet -- drinks a ton of sodas and sweet tea per sister's report.  She has since seen endocrinology; hoping to get a CGM soon, endocrinology is working on this for her.  Atrial fribrillation, chronic CHF; HTN She had to cancel her cardiology appointment due to illness and needs refills today. She does not have pending appointment. She is compliant with imdur 60 mg daily, hydralazine 25 mg TID, coreg 50 mg daily, eliqius 5 mg BID.  GERD Continues to have reflux. Completed course of protonix and this was very helpful for her, wants to go back on it.  Health Maintenance Due  Topic Date Due  . OPHTHALMOLOGY EXAM  Never done  . Hepatitis C Screening  Never done  . Zoster Vaccines- Shingrix (1 of 2) Never done  . COLONOSCOPY (Pts 45-77yr Insurance coverage will need to be confirmed)  Never done  . MAMMOGRAM  07/27/2016  . COVID-19 Vaccine (3 - Moderna risk series) 12/13/2019  . FOOT EXAM  06/08/2020  . URINE MICROALBUMIN  06/08/2020  . Pneumococcal Vaccine [id number] old (3 - PCV) 01/08/2021    Past Medical History:  Diagnosis Date  . A-fib ([place]   . Abdominal aortic aneurysm (AAA) ([place]   . Allergy   . Anemia   . Arthritis   . Atrial fibrillation (HE[place]   . CHF (congestive heart failure)  ([place]    a. EF at 30-35% by echo in 08/2018 b. EF at 35% by repeat echo in 05/2019  . Chronic abdominal pain   . Chronic atrial fibrillation (HCal[place]at[place]   . Cocaine abuse ([place]   . COPD (chronic obstructive pulmonary disease) (H[facility] [place]   . Diabetes mellitus without complication (HN[place]   . Essential hypertension, benign   . Expressive aphasia   . Expressive aphasia    post CVA  . Fatty liver   . GERD (gastroesophageal reflux disease)   . Gout 2016  . Normal coronary arteries    3/10 - following abnormal Myoview  . Ovarian cyst   . Stroke (HH[place] 12/26/2019   Right sided weakness, and expressive aphasia  . Stroke (H[place]   . Thoracic ascending aortic aneurysm (HCC)    4 cm 10/31/19 CTA  . Type 2 diabetes mellitus (HB[place]    type II     Social History   Tobacco Use  . Smoking status: Former    Years: 29.00    Types: Cigarettes    Quit date: 12/2019    Years since quitting: 1.2  . Smokeless tobacco: Never  Vaping Use  . Vaping Use: Never used  Substance Use Topics  . Alcohol use: Never    Comment: occ  . Drug use: Never  Types: Cocaine, Marijuana    Past Surgical History:  Procedure Laterality Date  . ABDOMINAL HYSTERECTOMY  09/10/2011   Procedure: HYSTERECTOMY ABDOMINAL;  Surgeon: Jonnie Kind, MD;  Location: [facility];  Service: Gynecology;  Laterality: N/A;  Abdominal hysterectomy  . CESAREAN SECTION  [id number], and 1994  . CHOLECYSTECTOMY  1995  . IR 3D INDEPENDENT WKST  03/16/2020  . IR ANGIO INTRA EXTRACRAN SEL INTERNAL CAROTID BILAT MOD SED  03/16/2020  . IR ANGIO VERTEBRAL SEL SUBCLAVIAN INNOMINATE UNI L MOD SED  03/16/2020  . IR ANGIO VERTEBRAL SEL VERTEBRAL UNI R MOD SED  03/16/2020  . IR CT HEAD LTD  12/27/2019  . IR PERCUTANEOUS ART THROMBECTOMY/INFUSION INTRACRANIAL INC DIAG ANGIO  12/27/2019  . IR US GUIDE VASC ACCESS RIGHT  12/27/2019  . IR US GUIDE VASC ACCESS RIGHT  03/16/2020  . RADIOLOGY WITH ANESTHESIA N/A 12/27/2019   Procedure: IR WITH ANESTHESIA;  Surgeon:  Luanne Bras, MD;  Location: [facility];  Service: Radiology;  Laterality: N/A;  . SCAR REVISION  09/10/2011   Procedure: SCAR REVISION;  Surgeon: Jonnie Kind, MD;  Location: [facility];  Service: Gynecology;  Laterality: N/A;  Wide Excision of old Cicatrix  . TUBAL LIGATION  1994    Family History  Problem Relation Age of Onset  . Cirrhosis Mother   . Early death Mother   . Alcohol abuse Mother   . Diabetes type II Father   . Alcohol abuse Father   . Diabetes type II Sister   . Early death Brother   . Alcohol abuse Son   . Anesthesia problems Neg Hx   . Hypotension Neg Hx   . Malignant hyperthermia Neg Hx   . Pseudochol deficiency Neg Hx   . Colon cancer Neg Hx   . Colon polyps Neg Hx   . Esophageal cancer Neg Hx   . Rectal cancer Neg Hx   . Stomach cancer Neg Hx     PMHx, SurgHx, SocialHx, FamHx, Medications, and Allergies were reviewed in the Visit Navigator and updated as appropriate.   Patient Active Problem List   Diagnosis Date Noted  . Diabetes mellitus ([place]) 03/09/2021  . Type 2 diabetes mellitus with stage 3a chronic kidney disease, with long-term current use of insulin ([place]) 03/09/2021  . Acute metabolic encephalopathy 75/05/2584  . Hyperglycemia 12/31/2020  . Prolonged QT interval 05/12/2020  . Atrial fibrillation, chronic ([place]) 05/12/2020  . Right hemiparesis ([place])   . Essential hypertension   . Chronic diastolic congestive heart failure ([place])   . Dysphagia due to recent stroke 01/05/2020  . Aneurysm of right carotid artery ([place]) paraclinoid 01/05/2020  . Embolic cerebral infarction [facility]) s/p clot retrieval 12/27/2019  . Thoracic aortic aneurysm without rupture ([place])   . Type 2 diabetes mellitus with diabetic autonomic neuropathy, with long-term current use of insulin ([place]) 09/01/2018  . Cocaine abuse ([place]) 08/25/2018  . Cigarette nicotine dependence, uncomplicated 27/78/2423  . GERD (gastroesophageal reflux disease) 06/07/2011  . COPD (chronic obstructive  pulmonary disease) ([place]) 06/07/2011  . Arthritis 06/07/2011    Social History   Tobacco Use  . Smoking status: Former    Years: 29.00    Types: Cigarettes    Quit date: 12/2019    Years since quitting: 1.2  . Smokeless tobacco: Never  Vaping Use  . Vaping Use: Never used  Substance Use Topics  . Alcohol use: Never    Comment: occ  . Drug use: Never    Types: Cocaine, Marijuana  Current Medications and Allergies:    Current Outpatient Medications:  .  aspirin EC 81 MG tablet, Take 1 tablet (81 mg total) by mouth daily. Swallow whole., Disp: 90 tablet, Rfl: 3 .  BD PEN NEEDLE NANO U/F 32G X 4 MM MISC, Inject 1 Device into the skin in the morning, at noon, in the evening, and at bedtime., Disp: 400 each, Rfl: 3 .  Blood Glucose Monitoring Suppl (ACCU-CHEK GUIDE ME) w/Device KIT, 1 Device by Does not apply route 3 (three) times daily., Disp: 1 kit, Rfl: 0 .  glucose blood (ACCU-CHEK GUIDE) test strip, 1 each by Other route 3 (three) times daily. Use as instructed, Disp: 300 each, Rfl: 3 .  insulin aspart (NOVOLOG FLEXPEN) 100 UNIT/ML FlexPen, Inject 8 Units into the skin 3 (three) times daily with meals., Disp: 30 mL, Rfl: 3 .  insulin glargine (LANTUS SOLOSTAR) 100 UNIT/ML Solostar Pen, Inject 25 Units into the skin daily., Disp: 30 mL, Rfl: 3 .  nitroGLYCERIN (NITROSTAT) 0.4 MG SL tablet, Place 1 tablet (0.4 mg total) under the tongue every 5 (five) minutes as needed for chest pain., Disp: 30 tablet, Rfl: 12 .  apixaban (ELIQUIS) 5 MG TABS tablet, Take 1 tablet (5 mg total) by mouth 2 (two) times daily., Disp: 180 tablet, Rfl: 0 .  carvedilol (COREG) 25 MG tablet, Take 1 tablet (25 mg total) by mouth 2 (two) times daily with a meal., Disp: 180 tablet, Rfl: 0 .  Continuous Blood Gluc Receiver (DEXCOM G6 RECEIVER) DEVI, 1 Device by Does not apply route as directed. (Patient not taking: Reported on 03/13/2021), Disp: 1 each, Rfl: 0 .  Continuous Blood Gluc Sensor (DEXCOM G6 SENSOR)  MISC, 1 Device by Does not apply route as directed. (Patient not taking: Reported on 03/13/2021), Disp: 9 each, Rfl: 3 .  Continuous Blood Gluc Transmit (DEXCOM G6 TRANSMITTER) MISC, 1 Device by Does not apply route as directed. (Patient not taking: Reported on 03/13/2021), Disp: 1 each, Rfl: 3 .  furosemide (LASIX) 20 MG tablet, Take 1 tablet (20 mg total) by mouth daily., Disp: 90 tablet, Rfl: 0 .  gabapentin (NEURONTIN) 100 MG capsule, Take 1 capsule (100 mg total) by mouth 3 (three) times daily., Disp: 270 capsule, Rfl: 3 .  hydrALAZINE (APRESOLINE) 25 MG tablet, Take 1 tablet (25 mg total) by mouth 3 (three) times daily., Disp: 270 tablet, Rfl: 0 .  isosorbide mononitrate (IMDUR) 30 MG 24 hr tablet, Take 2 tablets (60 mg total) by mouth daily., Disp: 180 tablet, Rfl: 0 .  pantoprazole (PROTONIX) 40 MG tablet, Take 1 tablet (40 mg total) by mouth daily., Disp: 90 tablet, Rfl: 0   Allergies  Allergen Reactions  . Bee Venom Shortness Of Breath and Swelling    Bodily Swelling  . Losartan Other (See Comments)    Nosebleeds per patient report.   . Naproxen Other (See Comments)    Acid reflux  . Penicillins Nausea Only    Has patient had a PCN reaction causing immediate rash, facial/tongue/throat swelling, SOB or lightheadedness with hypotension: no Has patient had a PCN reaction causing severe rash involving mucus membranes or skin necrosis: no Has patient had a PCN reaction that required hospitalization no Has patient had a PCN reaction occurring within the last 10 years: no If all of the above answers are "NO", then may proceed with Cephalosporin   . Lisinopril     Sinus congestion    Review of Systems   ROS Negative unless otherwise  specified per HPI.  Vitals:   Vitals:   03/13/21 1604  BP: 120/80  Pulse: (!) 102  Temp: 98.3 F (36.8 C)  TempSrc: Temporal  SpO2: 96%  Weight: 221 lb 6.1 oz (100.4 kg)  Height: _0  (1.702 m)     Body mass index is 34.67 kg/m.   Physical  Exam:    Physical Exam Vitals and nursing note reviewed.  Constitutional:      General: She is not in acute distress.    Appearance: She is well-developed. She is not ill-appearing or toxic-appearing.  Cardiovascular:     Rate and Rhythm: Normal rate. Rhythm irregularly irregular.     Pulses: Normal pulses.     Heart sounds: Normal heart sounds, S1 normal and S2 normal.     Comments: No LE edema Pulmonary:     Effort: Pulmonary effort is normal.     Breath sounds: Normal breath sounds.  Abdominal:     General: Abdomen is flat. Bowel sounds are normal.     Palpations: Abdomen is soft.     Tenderness: There is generalized abdominal tenderness. There is no right CVA tenderness, left CVA tenderness, guarding or rebound.  Skin:    General: Skin is warm and dry.  Neurological:     Mental Status: She is alert.     GCS: GCS eye subscore is 4. GCS verbal subscore is 5. GCS motor subscore is 6.  Psychiatric:        Behavior: Behavior normal. Behavior is cooperative.     Assessment and Plan:    Robin [name] was seen today for follow-up.  Diagnoses and all orders for this visit:  Atrial fibrillation, chronic ([place]); Essential hypertension; Chronic diastolic congestive heart failure (HCC) Continue medications as prescribed below. Courtesy refill today until she can be seen by cardiology. Follow-up with cardiology as indicated. -     isosorbide mononitrate (IMDUR) 30 MG 24 hr tablet; Take 2 tablets (60 mg total) by mouth daily. -     hydrALAZINE (APRESOLINE) 25 MG tablet; Take 1 tablet (25 mg total) by mouth 3 (three) times daily. -     furosemide (LASIX) 20 MG tablet; Take 1 tablet (20 mg total) by mouth daily. -     carvedilol (COREG) 25 MG tablet; Take 1 tablet (25 mg total) by mouth 2 (two) times daily with a meal. -     apixaban (ELIQUIS) 5 MG TABS tablet; Take 1 tablet (5 mg total) by mouth 2 (two) times daily.   Gastroesophageal reflux disease, unspecified whether esophagitis  present Uncontrolled. Refill protonix 40 mg. Encouraged compliant DM diet.  Pain of upper abdomen Ongoing. Suspect constipation. No evidence of acute abdomen. Encouraged colace 200 mg daily and miralax capful daily. Increase miralax after 3 days to 2 capfuls if needed. If no improvement, recommend follow-up in office in two weeks for further evaluation. Red flags reviewed and advised.  Other orders -     pantoprazole (PROTONIX) 40 MG tablet; Take 1 tablet (40 mg total) by mouth daily. -     gabapentin (NEURONTIN) 100 MG capsule; Take 1 capsule (100 mg total) by mouth 3 (three) times daily.  CMA or LPN served as scribe during this visit. History, Physical, and Plan performed by medical provider. The above documentation has been reviewed and is accurate and complete.  Time spent with patient today was 45 minutes which consisted of chart review, discussing diagnosis, work up, treatment answering questions and documentation.   Inda Coke, PA-C [place] [place] 03/13/2021  Follow-up: No follow-ups on file.

## 2021-03-13 NOTE — Patient Instructions (Signed)
It was great to see you!  Social worker: Noreene Larsson, [place], [place], [place] [address] Direct Dial: [phone]  Cardiology: please call and schedule with [facility]  [address] [place], [place], [place] [address] Phone: [phone]  Work on the bowel regimen for me, follow-up if no better or any new/worsening.  Take care,  Inda Coke PA-C

## 2021-03-27 ENCOUNTER — Ambulatory Visit: Payer: 59 | Admitting: Physician Assistant

## 2021-04-03 ENCOUNTER — Encounter: Payer: Self-pay | Admitting: Physician Assistant

## 2021-04-04 NOTE — Chronic Care Management (AMB) (Signed)
  Care Management   Outreach Note  04/04/2021 Name: Robin [name] MRN: [id number] DOB: [birth year]  Referred by: Inda Coke, [place] Reason for referral : Chronic Care Management (Outreach to schedule referral with LCSW)   Third unsuccessful telephone outreach was attempted today. The patient was referred to the case management team for assistance with care management and care coordination. The patient's primary care provider has been notified of our unsuccessful attempts to make or maintain contact with the patient. The care management team is pleased to engage with this patient at any time in the future should he/she be interested in assistance from the care management team.   Follow Up Plan:  We have been unable to make contact with the patient. The care management team is available to follow up with the patient after provider conversation with the patient regarding recommendation for care management engagement and subsequent re-referral to the care management team.   Noreene Larsson, [place], [place], [place] [address] Direct Dial: [phone] [email or web address]: [email or web address]

## 2021-05-10 ENCOUNTER — Emergency Department (HOSPITAL_COMMUNITY): Payer: Medicaid Other

## 2021-05-10 ENCOUNTER — Encounter (HOSPITAL_COMMUNITY): Payer: Self-pay | Admitting: *Deleted

## 2021-05-10 ENCOUNTER — Other Ambulatory Visit: Payer: Self-pay

## 2021-05-10 ENCOUNTER — Emergency Department (HOSPITAL_COMMUNITY)
Admission: EM | Admit: 2021-05-10 | Discharge: 2021-05-10 | Disposition: A | Discharge status: Home / Self Care | Payer: Medicaid Other | Attending: Emergency Medicine | Admitting: Emergency Medicine

## 2021-05-10 DIAGNOSIS — R0789 Other chest pain: Secondary | ICD-10-CM | POA: Diagnosis not present

## 2021-05-10 DIAGNOSIS — Z7901 Long term (current) use of anticoagulants: Secondary | ICD-10-CM | POA: Insufficient documentation

## 2021-05-10 DIAGNOSIS — Z794 Long term (current) use of insulin: Secondary | ICD-10-CM | POA: Insufficient documentation

## 2021-05-10 DIAGNOSIS — R739 Hyperglycemia, unspecified: Secondary | ICD-10-CM

## 2021-05-10 DIAGNOSIS — Z79899 Other long term (current) drug therapy: Secondary | ICD-10-CM | POA: Insufficient documentation

## 2021-05-10 DIAGNOSIS — I5032 Chronic diastolic (congestive) heart failure: Secondary | ICD-10-CM | POA: Insufficient documentation

## 2021-05-10 DIAGNOSIS — I4891 Unspecified atrial fibrillation: Secondary | ICD-10-CM | POA: Insufficient documentation

## 2021-05-10 DIAGNOSIS — J449 Chronic obstructive pulmonary disease, unspecified: Secondary | ICD-10-CM | POA: Insufficient documentation

## 2021-05-10 DIAGNOSIS — Z7982 Long term (current) use of aspirin: Secondary | ICD-10-CM | POA: Diagnosis not present

## 2021-05-10 DIAGNOSIS — N1831 Chronic kidney disease, stage 3a: Secondary | ICD-10-CM | POA: Diagnosis not present

## 2021-05-10 DIAGNOSIS — I130 Hypertensive heart and chronic kidney disease with heart failure and stage 1 through stage 4 chronic kidney disease, or unspecified chronic kidney disease: Secondary | ICD-10-CM | POA: Diagnosis not present

## 2021-05-10 DIAGNOSIS — E1165 Type 2 diabetes mellitus with hyperglycemia: Secondary | ICD-10-CM | POA: Insufficient documentation

## 2021-05-10 DIAGNOSIS — Z87891 Personal history of nicotine dependence: Secondary | ICD-10-CM | POA: Diagnosis not present

## 2021-05-10 LAB — CBC WITH DIFFERENTIAL/PLATELET
Abs Immature Granulocytes: 0.01 10*3/uL (ref 0.00–0.07)
Basophils Absolute: 0 10*3/uL (ref 0.0–0.1)
Basophils Relative: 1 %
Eosinophils Absolute: 0.1 10*3/uL (ref 0.0–0.5)
Eosinophils Relative: 2 %
HCT: 37.5 % (ref 36.0–46.0)
Hemoglobin: 11.7 g/dL — ABNORMAL LOW (ref 12.0–15.0)
Immature Granulocytes: 0 %
Lymphocytes Relative: 25 %
Lymphs Abs: 1.6 10*3/uL (ref 0.7–4.0)
MCH: 25.3 pg — ABNORMAL LOW (ref 26.0–34.0)
MCHC: 31.2 g/dL (ref 30.0–36.0)
MCV: 81 fL (ref 80.0–100.0)
Monocytes Absolute: 0.4 10*3/uL (ref 0.1–1.0)
Monocytes Relative: 7 %
Neutro Abs: 4.2 10*3/uL (ref 1.7–7.7)
Neutrophils Relative %: 65 %
Platelets: 260 10*3/uL (ref 150–400)
RBC: 4.63 MIL/uL (ref 3.87–5.11)
RDW: 15.4 % (ref 11.5–15.5)
WBC: 6.3 10*3/uL (ref 4.0–10.5)
nRBC: 0 % (ref 0.0–0.2)

## 2021-05-10 LAB — MAGNESIUM: Magnesium: 1.9 mg/dL (ref 1.7–2.4)

## 2021-05-10 LAB — COMPREHENSIVE METABOLIC PANEL
ALT: 12 U/L (ref 0–44)
AST: 17 U/L (ref 15–41)
Albumin: 3.7 g/dL (ref 3.5–5.0)
Alkaline Phosphatase: 114 U/L (ref 38–126)
Anion gap: 8 (ref 5–15)
BUN: 24 mg/dL — ABNORMAL HIGH (ref 6–20)
CO2: 24 mmol/L (ref 22–32)
Calcium: 8.8 mg/dL — ABNORMAL LOW (ref 8.9–10.3)
Chloride: 100 mmol/L (ref 98–111)
Creatinine, Ser: 2.13 mg/dL — ABNORMAL HIGH (ref 0.44–1.00)
GFR, Estimated: 27 mL/min — ABNORMAL LOW (ref >60)
Glucose, Bld: 501 mg/dL (ref 70–99)
Potassium: 4.3 mmol/L (ref 3.5–5.1)
Sodium: 132 mmol/L — ABNORMAL LOW (ref 135–145)
Total Bilirubin: 0.6 mg/dL (ref 0.3–1.2)
Total Protein: 7.4 g/dL (ref 6.5–8.1)

## 2021-05-10 LAB — BRAIN NATRIURETIC PEPTIDE: B Natriuretic Peptide: 284 pg/mL — ABNORMAL HIGH (ref 0.0–100.0)

## 2021-05-10 LAB — TROPONIN I (HIGH SENSITIVITY)
Troponin I (High Sensitivity): 10 ng/L (ref <18)
Troponin I (High Sensitivity): 9 ng/L (ref <18)

## 2021-05-10 LAB — PROTIME-INR
INR: 1.4 — ABNORMAL HIGH (ref 0.8–1.2)
Prothrombin Time: 17.5 seconds — ABNORMAL HIGH (ref 11.4–15.2)

## 2021-05-10 LAB — POC URINE PREG, ED: Preg Test, Ur: NEGATIVE

## 2021-05-10 LAB — CBG MONITORING, ED
Glucose-Capillary: 453 mg/dL — ABNORMAL HIGH (ref 70–99)
Glucose-Capillary: 298 mg/dL — ABNORMAL HIGH (ref 70–99)
Glucose-Capillary: 261 mg/dL — ABNORMAL HIGH (ref 70–99)

## 2021-05-10 LAB — D-DIMER, QUANTITATIVE: D-Dimer, Quant: 0.27 ug/mL-FEU (ref 0.00–0.50)

## 2021-05-10 IMAGING — DX DG CHEST 1V PORT
1 series · 1 of 1 positions shown · non-contrast
Comparison: Prior chest radiographs [DATE] and earlier.

CLINICAL DATA: Chest pain. Additional history provided: Patient
reports left-sided chest pain since last night.

EXAM:
PORTABLE CHEST 1 VIEW

[chest ap]
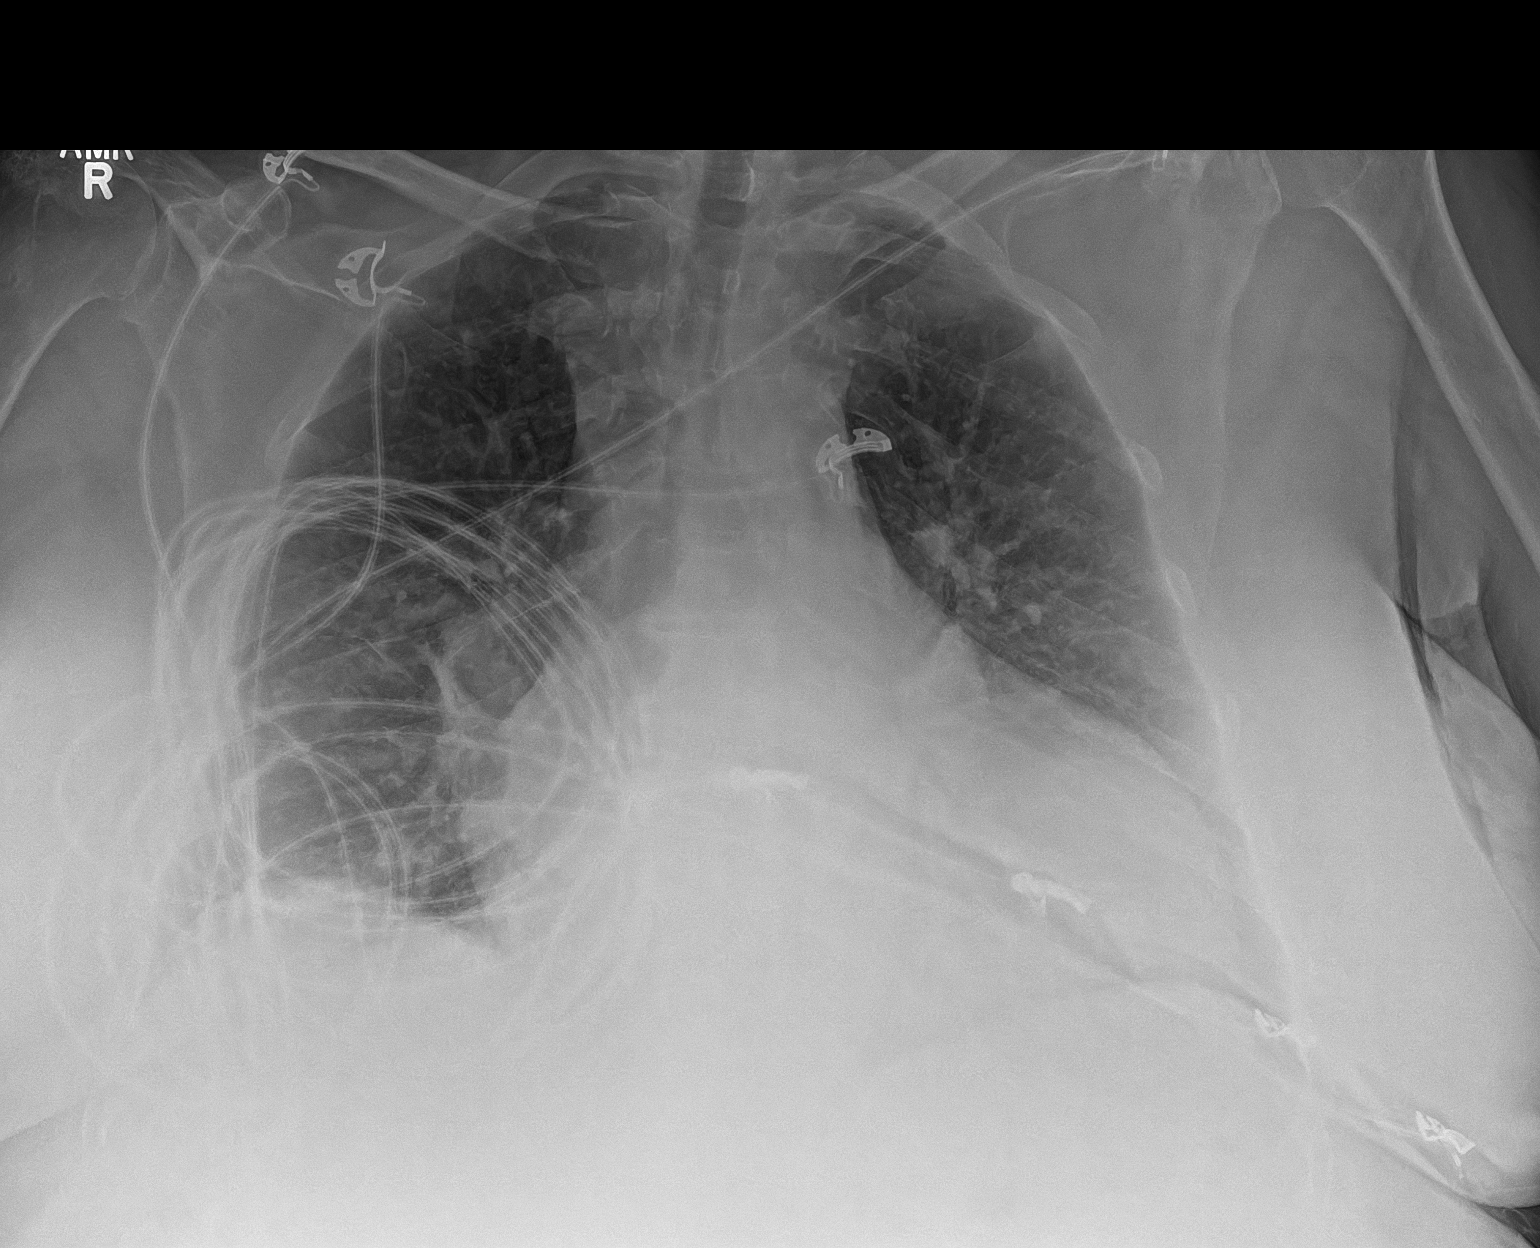

[1 of 1 positions shown; findings below may reference images not displayed]

FINDINGS: Unchanged cardiomegaly. Central pulmonary vascular congestion
without overt pulmonary edema. No appreciable airspace
consolidation. No evidence of pleural effusion or pneumothorax. No
acute bony abnormality identified.
IMPRESSION: Cardiomegaly and central pulmonary vascular congestion, similar as
compared to the prior chest radiographs of [DATE]. No overt
pulmonary edema or appreciable airspace consolidation.

## 2021-05-10 MED ORDER — INSULIN ASPART 100 UNIT/ML IJ SOLN
10.0000 [IU] | Freq: Once | INTRAMUSCULAR | Status: AC
  Administered 2021-05-10: 10 [IU] via INTRAVENOUS
  Filled 2021-05-10: qty 1

## 2021-05-10 MED ORDER — SODIUM CHLORIDE 0.9 % IV BOLUS
500.0000 mL | Freq: Once | INTRAVENOUS | Status: AC
  Administered 2021-05-10: 500 mL via INTRAVENOUS

## 2021-05-10 MED ORDER — ASPIRIN 81 MG PO CHEW
324.0000 mg | CHEWABLE_TABLET | Freq: Once | ORAL | Status: AC
  Administered 2021-05-10: 324 mg via ORAL
  Filled 2021-05-10: qty 4

## 2021-05-10 MED ORDER — ALBUTEROL SULFATE HFA 108 (90 BASE) MCG/ACT IN AERS
INHALATION_SPRAY | RESPIRATORY_TRACT | Status: AC
  Filled 2021-05-10: qty 6.7

## 2021-05-10 NOTE — ED Triage Notes (Signed)
Pt c/o left sided chest pain around her breast since last night.

## 2021-05-10 NOTE — Discharge Instructions (Addendum)
As discussed, your evaluation today has been largely reassuring.  But, it is important that you monitor your condition carefully, and do not hesitate to return to the ED if you develop new, or concerning changes in your condition. ? ?Otherwise, please follow-up with your physician for appropriate ongoing care. ? ?

## 2021-05-10 NOTE — ED Notes (Signed)
Sister updated on plan to dc home. She is calling her son to pick up. Son will need to be present during discharge instruction review d/t pt difficulty with communication

## 2021-05-10 NOTE — ED Provider Notes (Signed)
[facility] Provider Note   CSN: [id number] Arrival date & time: 05/10/21  0736     History Chief Complaint  Patient presents with   Chest Pain    Valerie Cones Foucher is a 55 y.o. female.  HPI Patient presents with her son who assists with the history.  Patient has history of stroke, baseline aphasia, level 5 caveat secondary to verbal difficulty. Seemingly since yesterday patient has complained of pain in the left upper chest.  There is no associated nausea, but no reported vomiting.  Seemingly no headache, or abdominal pain.  There is associated generalized discomfort, but the patient cannot specify what else is bothering her in particular. No reported change in medication, diet, activity. Patient has history obtained from chart review of A. fib, CHF, cocaine abuse.  She is on Eliquis and aspirin.    Past Medical History:  Diagnosis Date   A-fib [facility])    Abdominal aortic aneurysm (AAA)    Allergy    Anemia    Arthritis    Atrial fibrillation (HCC)    CHF (congestive heart failure) ([place])    a. EF at 30-35% by echo in 08/2018 b. EF at 35% by repeat echo in 05/2019   Chronic abdominal pain    Chronic atrial fibrillation (HCC)    Cocaine abuse ([place])    COPD (chronic obstructive pulmonary disease) ([place])    Diabetes mellitus without complication ([place])    Essential hypertension, benign    Expressive aphasia    Expressive aphasia    post CVA   Fatty liver    GERD (gastroesophageal reflux disease)    Gout 2016   Normal coronary arteries    3/10 - following abnormal Myoview   Ovarian cyst    Stroke ([place]) 12/26/2019   Right sided weakness, and expressive aphasia   Stroke [facility])    Thoracic ascending aortic aneurysm    4 cm 10/31/19 CTA   Type 2 diabetes mellitus ([place])    type II    Patient Active Problem List   Diagnosis Date Noted   Diabetes mellitus ([place]) 03/09/2021   Type 2 diabetes mellitus with stage 3a chronic kidney disease, with long-term  current use of insulin ([place]) 97/67/3419   Acute metabolic encephalopathy 37/90/2409   Hyperglycemia 12/31/2020   Prolonged QT interval 05/12/2020   Atrial fibrillation, chronic (HCC) 05/12/2020   Right hemiparesis ([place])    Essential hypertension    Chronic diastolic congestive heart failure ([place])    Dysphagia due to recent stroke 01/05/2020   Aneurysm of right carotid artery ([place]) paraclinoid 73/53/2992   Embolic cerebral infarction [facility]) s/p clot retrieval 12/27/2019   Thoracic aortic aneurysm without rupture    Type 2 diabetes mellitus with diabetic autonomic neuropathy, with long-term current use of insulin ([place]) 09/01/2018   Cocaine abuse ([place]) 08/25/2018   Cigarette nicotine dependence, uncomplicated 42/68/3419   GERD (gastroesophageal reflux disease) 06/07/2011   COPD (chronic obstructive pulmonary disease) ([place]) 06/07/2011   Arthritis 06/07/2011    Past Surgical History:  Procedure Laterality Date   ABDOMINAL HYSTERECTOMY  09/10/2011   Procedure: HYSTERECTOMY ABDOMINAL;  Surgeon: Jonnie Kind, MD;  Location: [facility];  Service: Gynecology;  Laterality: N/A;  Abdominal hysterectomy   CESAREAN SECTION  [id number], and [place]  03/16/2020   IR ANGIO INTRA EXTRACRAN SEL INTERNAL CAROTID BILAT MOD SED  03/16/2020   IR ANGIO VERTEBRAL SEL SUBCLAVIAN INNOMINATE UNI L MOD SED  03/16/2020   IR ANGIO VERTEBRAL SEL VERTEBRAL UNI R MOD SED  03/16/2020   IR CT HEAD LTD  12/27/2019   IR PERCUTANEOUS ART THROMBECTOMY/INFUSION INTRACRANIAL INC DIAG ANGIO  12/27/2019   IR US GUIDE VASC ACCESS RIGHT  12/27/2019   IR US GUIDE VASC ACCESS RIGHT  03/16/2020   RADIOLOGY WITH ANESTHESIA N/A 12/27/2019   Procedure: IR WITH ANESTHESIA;  Surgeon: Luanne Bras, MD;  Location: [facility];  Service: Radiology;  Laterality: N/A;   SCAR REVISION  09/10/2011   Procedure: SCAR REVISION;  Surgeon: Jonnie Kind, MD;  Location: [facility];  Service: Gynecology;  Laterality:  N/A;  Wide Excision of old Cicatrix   TUBAL LIGATION  1994     OB History     Gravida  3   Para  0   Term  0   Preterm  0   AB  0   Living         SAB  0   IAB  0   Ectopic  0   Multiple      Live Births              Family History  Problem Relation Age of Onset   Cirrhosis Mother    Early death Mother    Alcohol abuse Mother    Diabetes type II Father    Alcohol abuse Father    Diabetes type II Sister    Early death Brother    Alcohol abuse Son    Anesthesia problems Neg Hx    Hypotension Neg Hx    Malignant hyperthermia Neg Hx    Pseudochol deficiency Neg Hx    Colon cancer Neg Hx    Colon polyps Neg Hx    Esophageal cancer Neg Hx    Rectal cancer Neg Hx    Stomach cancer Neg Hx     Social History   Tobacco Use   Smoking status: Former    Years: 29.00    Types: Cigarettes    Quit date: 12/2019    Years since quitting: 1.4   Smokeless tobacco: Never  Vaping Use   Vaping Use: Never used  Substance Use Topics   Alcohol use: Not Currently    Comment: occ   Drug use: Not Currently    Types: Cocaine, Marijuana    Home Medications Prior to Admission medications   Medication Sig Start Date End Date Taking? Authorizing Provider  apixaban (ELIQUIS) 5 MG TABS tablet Take 1 tablet (5 mg total) by mouth 2 (two) times daily. 03/13/21  Yes Inda Coke, PA  aspirin EC 81 MG tablet Take 1 tablet (81 mg total) by mouth daily. Swallow whole. 07/19/20  Yes Strader, [place], PA-C  carvedilol (COREG) 25 MG tablet Take 1 tablet (25 mg total) by mouth 2 (two) times daily with a meal. 03/13/21  Yes Inda Coke, PA  furosemide (LASIX) 20 MG tablet Take 1 tablet (20 mg total) by mouth daily. 03/13/21 03/13/22 Yes Inda Coke, PA  gabapentin (NEURONTIN) 100 MG capsule Take 1 capsule (100 mg total) by mouth 3 (three) times daily. 03/13/21  Yes Inda Coke, PA  hydrALAZINE (APRESOLINE) 25 MG tablet Take 1 tablet (25 mg total) by mouth 3 (three) times  daily. 03/13/21  Yes Worley, Aldona Bar, PA  insulin aspart (NOVOLOG FLEXPEN) 100 UNIT/ML FlexPen Inject 8 Units into the skin 3 (three) times daily with meals. 03/09/21  Yes Shamleffer, Melanie Crazier, MD  insulin glargine (LANTUS SOLOSTAR) 100 UNIT/ML Solostar  Pen Inject 25 Units into the skin daily. 03/09/21  Yes Shamleffer, Melanie Crazier, MD  isosorbide mononitrate (IMDUR) 30 MG 24 hr tablet Take 2 tablets (60 mg total) by mouth daily. 03/13/21  Yes Inda Coke, PA  pantoprazole (PROTONIX) 40 MG tablet Take 1 tablet (40 mg total) by mouth daily. 03/13/21  Yes Inda Coke, PA  BD PEN NEEDLE NANO U/F 32G X 4 MM MISC Inject 1 Device into the skin in the morning, at noon, in the evening, and at bedtime. 03/09/21   Shamleffer, Melanie Crazier, MD  Blood Glucose Monitoring Suppl (ACCU-CHEK GUIDE ME) w/Device KIT 1 Device by Does not apply route 3 (three) times daily. 03/09/21   Shamleffer, Melanie Crazier, MD  Continuous Blood Gluc Receiver (DEXCOM G6 RECEIVER) DEVI 1 Device by Does not apply route as directed. Patient not taking: No sig reported 03/09/21   Shamleffer, Melanie Crazier, MD  Continuous Blood Gluc Sensor (DEXCOM G6 SENSOR) MISC 1 Device by Does not apply route as directed. Patient not taking: No sig reported 03/09/21   Shamleffer, Melanie Crazier, MD  Continuous Blood Gluc Transmit (DEXCOM G6 TRANSMITTER) MISC 1 Device by Does not apply route as directed. Patient not taking: No sig reported 03/09/21   Shamleffer, Melanie Crazier, MD  glucose blood (ACCU-CHEK GUIDE) test strip 1 each by Other route 3 (three) times daily. Use as instructed 03/09/21   Shamleffer, Melanie Crazier, MD  nitroGLYCERIN (NITROSTAT) 0.4 MG SL tablet Place 1 tablet (0.4 mg total) under the tongue every 5 (five) minutes as needed for chest pain. 02/01/20   Angiulli, Lavon Paganini, PA-C    Allergies    Bee venom, Losartan, Naproxen, Penicillins, and Lisinopril  Review of Systems   Review of Systems  Unable to perform ROS:  Patient nonverbal   Physical Exam Updated Vital Signs BP (!) 151/107   Pulse (!) 53   Temp 98.1 F (36.7 C) (Oral)   Resp 20   Ht _0  (1.575 m)   Wt 99.8 kg   LMP 08/21/2011   SpO2 100%   BMI 40.24 kg/m   Physical Exam Vitals and nursing note reviewed.  Constitutional:      Appearance: She is well-developed. She is obese. She is ill-appearing and diaphoretic.  HENT:     Head: Normocephalic and atraumatic.  Eyes:     Conjunctiva/sclera: Conjunctivae normal.  Cardiovascular:     Rate and Rhythm: Tachycardia present. Rhythm irregular.  Pulmonary:     Effort: Pulmonary effort is normal. Tachypnea present. No respiratory distress.     Breath sounds: Normal breath sounds. No stridor.  Abdominal:     General: There is no distension.     Palpations: There is no fluid wave.     Tenderness: There is abdominal tenderness. There is no rebound.  Skin:    General: Skin is warm.  Neurological:     Mental Status: She is alert.     Cranial Nerves: No cranial nerve deficit.     Comments: Right-sided hemiplegia, unchanged according the patient and her son.  Speech is inconsistently intelligible, with some words that are clear others that are uninterpretable.    ED Results / Procedures / Treatments   Labs (all labs ordered are listed, but only abnormal results are displayed) Labs Reviewed  COMPREHENSIVE METABOLIC PANEL - Abnormal; Notable for the following components:      Result Value   Sodium 132 (*)    Glucose, Bld 501 (*)    BUN 24 (*)  Creatinine, Ser 2.13 (*)    Calcium 8.8 (*)    GFR, Estimated 27 (*)    All other components within normal limits  BRAIN NATRIURETIC PEPTIDE - Abnormal; Notable for the following components:   B Natriuretic Peptide 284.0 (*)    All other components within normal limits  CBC WITH DIFFERENTIAL/PLATELET - Abnormal; Notable for the following components:   Hemoglobin 11.7 (*)    MCH 25.3 (*)    All other components within normal limits   PROTIME-INR - Abnormal; Notable for the following components:   Prothrombin Time 17.5 (*)    INR 1.4 (*)    All other components within normal limits  CBG MONITORING, ED - Abnormal; Notable for the following components:   Glucose-Capillary 453 (*)    All other components within normal limits  CBG MONITORING, ED - Abnormal; Notable for the following components:   Glucose-Capillary 298 (*)    All other components within normal limits  CBG MONITORING, ED - Abnormal; Notable for the following components:   Glucose-Capillary 261 (*)    All other components within normal limits  MAGNESIUM  D-DIMER, QUANTITATIVE  POC URINE PREG, ED  TROPONIN I (HIGH SENSITIVITY)  TROPONIN I (HIGH SENSITIVITY)    EKG EKG Interpretation  Date/Time:  Thursday May 10 2021 08:04:17 EDT Ventricular Rate:  97 PR Interval:    QRS Duration: 83 QT Interval:  370 QTC Calculation: 473 R Axis:   69 Text Interpretation: Atrial fibrillation Anterior infarct, age indeterminate ST-t wave abnormality Abnormal ECG Confirmed by Carmin Muskrat [phone]) on 05/10/2021 8:09:03 AM  Radiology DG Chest Portable 1 View  Result Date: 05/10/2021 CLINICAL DATA:  Chest pain. Additional history provided: Patient reports left-sided chest pain since last night. EXAM: PORTABLE CHEST 1 VIEW COMPARISON:  Prior chest radiographs 12/31/2020 and earlier. FINDINGS: Unchanged cardiomegaly. Central pulmonary vascular congestion without overt pulmonary edema. No appreciable airspace consolidation. No evidence of pleural effusion or pneumothorax. No acute bony abnormality identified. IMPRESSION: Cardiomegaly and central pulmonary vascular congestion, similar as compared to the prior chest radiographs of 12/31/2020. No overt pulmonary edema or appreciable airspace consolidation. Electronically Signed   By: Kellie Simmering D.O.   On: 05/10/2021 08:56    Procedures Procedures   Medications Ordered in ED Medications  albuterol (VENTOLIN HFA) 108  (90 Base) MCG/ACT inhaler (  Canceled Entry 05/10/21 0913)  aspirin chewable tablet 324 mg (324 mg Oral Given 05/10/21 0826)  insulin aspart (novoLOG) injection 10 Units (10 Units Intravenous Given 05/10/21 1232)  sodium chloride 0.9 % bolus 500 mL (500 mLs Intravenous New Bag/Given 05/10/21 1232)    ED Course  I have reviewed the triage vital signs and the nursing notes.  Pertinent labs & imaging results that were available during my care of the patient were reviewed by me and considered in my medical decision making (see chart for details).  Cardiac 90s A. fib abnormal Pulse ox 99% room air normal   I reviewed the patient's chart including multiple evaluation for dissection, with most recent result as below: IMPRESSION: 1. No evidence of aortic dissection or aneurysmal disease. 2. No evidence of pulmonary embolism. 3. Mild ill-defined areas of ground-glass opacity in both lungs are likely on the basis of atelectasis as well as some motion artifact. However, given the previous depiction of bilateral pneumonia, there may be some residual mild opacity remaining related to prior pneumonia, potentially related to prior COVID-19 infection. 4. Stable cardiac enlargement. 5. No acute findings in the abdomen or pelvis.  Aortic Atherosclerosis (ICD10-I70.0).     Electronically Signed   By: Aletta Edouard M.D.   On: 11/13/2020 10:25  3:09 PM Patient calm, in no distress, no ongoing pain.  Vital signs unremarkable.  I reviewed her vital signs, labs, imaging studies.  Labs notable for no elevation in troponin, BNP about her typical values, and negative D-dimer, reassuring for low suspicion of PE or dissection given her prior reassuring studies. Patient was found to have hyperglycemia, though without gap acidosis.  Patient's glucose level improved here with insulin.  With no oxygen requirement, no hemodynamic instability, noted she does have mild hypertension, and with improved condition in  general, patient discharged in stable condition to follow-up with primary care. MDM Rules/Calculators/A&P MDM Number of Diagnoses or Management Options Atypical chest pain: new, needed workup Hyperglycemia: new, needed workup   Amount and/or Complexity of Data Reviewed Clinical lab tests: ordered and reviewed Tests in the radiology section of CPT: ordered and reviewed Tests in the medicine section of CPT: reviewed and ordered Decide to obtain previous medical records or to obtain history from someone other than the patient: yes Obtain history from someone other than the patient: yes Review and summarize past medical records: yes Independent visualization of images, tracings, or specimens: yes  Risk of Complications, Morbidity, and/or Mortality Presenting problems: high Diagnostic procedures: high Management options: high  Critical Care Total time providing critical care: < 30 minutes  Patient Progress Patient progress: improved   Final Clinical Impression(s) / ED Diagnoses Final diagnoses:  Atypical chest pain  Hyperglycemia     Carmin Muskrat, MD 05/10/21 1511

## 2021-05-11 ENCOUNTER — Telehealth: Payer: Self-pay

## 2021-05-11 NOTE — Telephone Encounter (Signed)
Transition Care Management Follow-up Telephone Call Date of discharge and from where: 05/10/2021-[facility] How have you been since you were released from the hospital? Patient stated she is doing fine.  Any questions or concerns? No  Items Reviewed: Did the pt receive and understand the discharge instructions provided? Yes  Medications obtained and verified?  No medications given at discharge  Other? No  Any new allergies since your discharge? No  Dietary orders reviewed? No Do you have support at home? Yes   Home Care and Equipment/Supplies: Were home health services ordered? not applicable If so, what is the name of the agency? N/A  Has the agency set up a time to come to the patient's home? not applicable Were any new equipment or medical supplies ordered?  No What is the name of the medical supply agency? N/A Were you able to get the supplies/equipment? not applicable Do you have any questions related to the use of the equipment or supplies? No  Functional Questionnaire: (I = Independent and D = Dependent) ADLs: I  Bathing/Dressing- I  Meal Prep- I  Eating- I  Maintaining continence- I  Transferring/Ambulation- I  Managing Meds- I  Follow up appointments reviewed:  PCP Hospital f/u appt confirmed? No   Specialist Hospital f/u appt confirmed? No   Are transportation arrangements needed? No  If their condition worsens, is the pt aware to call PCP or go to the Emergency Dept.? Yes Was the patient provided with contact information for the PCP's office or ED? Yes Was to pt encouraged to call back with questions or concerns? Yes

## 2021-06-13 ENCOUNTER — Other Ambulatory Visit: Payer: Self-pay

## 2021-06-13 ENCOUNTER — Telehealth: Payer: Self-pay | Admitting: Internal Medicine

## 2021-06-13 ENCOUNTER — Ambulatory Visit (INDEPENDENT_AMBULATORY_CARE_PROVIDER_SITE_OTHER): Payer: Medicaid Other | Admitting: Internal Medicine

## 2021-06-13 VITALS — BP 136/88 | HR 108 | Ht 62.0 in | Wt 237.8 lb

## 2021-06-13 DIAGNOSIS — Z794 Long term (current) use of insulin: Secondary | ICD-10-CM

## 2021-06-13 DIAGNOSIS — E1143 Type 2 diabetes mellitus with diabetic autonomic (poly)neuropathy: Secondary | ICD-10-CM

## 2021-06-13 DIAGNOSIS — E1165 Type 2 diabetes mellitus with hyperglycemia: Secondary | ICD-10-CM

## 2021-06-13 DIAGNOSIS — N1831 Chronic kidney disease, stage 3a: Secondary | ICD-10-CM

## 2021-06-13 DIAGNOSIS — Z23 Encounter for immunization: Secondary | ICD-10-CM

## 2021-06-13 DIAGNOSIS — E1122 Type 2 diabetes mellitus with diabetic chronic kidney disease: Secondary | ICD-10-CM

## 2021-06-13 DIAGNOSIS — R0989 Other specified symptoms and signs involving the circulatory and respiratory systems: Secondary | ICD-10-CM

## 2021-06-13 DIAGNOSIS — E1159 Type 2 diabetes mellitus with other circulatory complications: Secondary | ICD-10-CM

## 2021-06-13 LAB — POCT GLYCOSYLATED HEMOGLOBIN (HGB A1C): Hemoglobin A1C: 13.4 % — AB (ref 4.0–5.6)

## 2021-06-13 MED ORDER — NOVOLOG FLEXPEN 100 UNIT/ML ~~LOC~~ SOPN: 12.0000 [IU] | PEN_INJECTOR | Freq: Three times a day (TID) | SUBCUTANEOUS | 3 refills | Status: DC

## 2021-06-13 MED ORDER — ACCU-CHEK GUIDE VI STRP
1.0000 | ORAL_STRIP | Freq: Three times a day (TID) | 3 refills | Status: DC
Start: 2021-06-13 — End: 2022-08-28

## 2021-06-13 MED ORDER — LANTUS SOLOSTAR 100 UNIT/ML ~~LOC~~ SOPN: 30.0000 [IU] | PEN_INJECTOR | Freq: Every day | SUBCUTANEOUS | 3 refills | Status: DC

## 2021-06-13 MED ORDER — ACCU-CHEK SOFTCLIX LANCETS MISC: 12 refills | Status: AC

## 2021-06-13 MED ORDER — DEXCOM G6 SENSOR MISC: 1.0000 | 3 refills | Status: DC

## 2021-06-13 MED ORDER — DEXCOM G6 TRANSMITTER MISC: 1.0000 | 3 refills | Status: DC

## 2021-06-13 MED ORDER — NOVOLOG FLEXPEN 100 UNIT/ML ~~LOC~~ SOPN: 8.0000 [IU] | PEN_INJECTOR | Freq: Three times a day (TID) | SUBCUTANEOUS | 3 refills | Status: DC

## 2021-06-13 MED ORDER — BD PEN NEEDLE NANO U/F 32G X 4 MM MISC: 1.0000 | Freq: Four times a day (QID) | 3 refills | Status: DC

## 2021-06-13 MED ORDER — TRULICITY 0.75 MG/0.5ML ~~LOC~~ SOAJ: 0.7500 mg | SUBCUTANEOUS | 3 refills | Status: DC

## 2021-06-13 MED ORDER — DEXCOM G6 RECEIVER DEVI: 1.0000 | 0 refills | Status: DC

## 2021-06-13 NOTE — Progress Notes (Signed)
Name: Robin [name]  Age/ Sex: 55 y.o., female   MRN/ DOB: [id number], 1966-04-26     PCP: Inda Coke, PA   Reason for Endocrinology Evaluation: Type 2 Diabetes Mellitus  Initial Endocrine Consultative Visit: 10/01/2018    PATIENT IDENTIFIER: [name] is a 55 y.o. female with a past medical history of T2DM, CHF, COPD , CVA, A.Fib with RVR and GERD. The patient has followed with Endocrinology clinic since 10/01/2018 for consultative assistance with management of her diabetes.  DIABETIC HISTORY:  [name] was diagnosed with DM > 20 yrs ago. Has been on Janumet and Glipizide in the past. Has been on insulin for years. Her hemoglobin A1c has ranged from 7.9% in 2017, peaking at 11.3% in 0071  Started trulicity 2197  SUBJECTIVE:   During the last visit (03/09/2021): A1c 10.3 % ,increased Lantus and started prandial insulin    Today (06/13/2021): [name] is here for a follow up on diabetes management. She checks glucose 1-3 x a day. No hypoglycemia   She had an ED visit in 05/2021 for atypical chest pain  She is accompanied by her sister  She did not get the dexcom yet  Has been having  leg pains associated with back pains for the past 2 days.  Denies  nausea, vomiting or diarrhea Has a care taker 8 Am to 1 pm   HOME DIABETES REGIMEN:  Lantus 25 units daily- takes 24 units  NovoLog 8 units with each meal - takes it at 9 AM, 1 pm,  6 pm  takes 16 units     METER DOWNLOAD SUMMARY: Unable to download 274- 402 mg/dL    DIABETIC COMPLICATIONS: Microvascular complications:  Neuropathy Denies: CKD, retinopathy  Last eye exam: Completed many years ago    Macrovascular complications:  CVA 12/8830 Denies: CAD, PVA    HISTORY:  Past Medical History:  Past Medical History:  Diagnosis Date   A-fib ([place])    Abdominal aortic aneurysm (AAA)    Allergy    Anemia    Arthritis    Atrial fibrillation (HCC)    CHF (congestive heart failure) ([place])    a. EF at  30-35% by echo in 08/2018 b. EF at 35% by repeat echo in 05/2019   Chronic abdominal pain    Chronic atrial fibrillation (HCC)    Cocaine abuse ([place])    COPD (chronic obstructive pulmonary disease) ([place])    Diabetes mellitus without complication ([place])    Essential hypertension, benign    Expressive aphasia    Expressive aphasia    post CVA   Fatty liver    GERD (gastroesophageal reflux disease)    Gout 2016   Normal coronary arteries    3/10 - following abnormal Myoview   Ovarian cyst    Stroke ([place]) 12/26/2019   Right sided weakness, and expressive aphasia   Stroke [facility])    Thoracic ascending aortic aneurysm    4 cm 10/31/19 CTA   Type 2 diabetes mellitus ([place])    type II   Past Surgical History:  Past Surgical History:  Procedure Laterality Date   ABDOMINAL HYSTERECTOMY  09/10/2011   Procedure: HYSTERECTOMY ABDOMINAL;  Surgeon: Jonnie Kind, MD;  Location: [facility];  Service: Gynecology;  Laterality: N/A;  Abdominal hysterectomy   CESAREAN SECTION  5498,2641, and [place]  03/16/2020   IR ANGIO INTRA EXTRACRAN SEL INTERNAL CAROTID BILAT MOD SED  03/16/2020   IR ANGIO VERTEBRAL SEL SUBCLAVIAN INNOMINATE UNI L MOD SED  03/16/2020   IR ANGIO VERTEBRAL SEL VERTEBRAL UNI R MOD SED  03/16/2020   IR CT HEAD LTD  12/27/2019   IR PERCUTANEOUS ART THROMBECTOMY/INFUSION INTRACRANIAL INC DIAG ANGIO  12/27/2019   IR US GUIDE VASC ACCESS RIGHT  12/27/2019   IR US GUIDE VASC ACCESS RIGHT  03/16/2020   RADIOLOGY WITH ANESTHESIA N/A 12/27/2019   Procedure: IR WITH ANESTHESIA;  Surgeon: Luanne Bras, MD;  Location: [place];  Service: Radiology;  Laterality: N/A;   SCAR REVISION  09/10/2011   Procedure: SCAR REVISION;  Surgeon: Jonnie Kind, MD;  Location: [facility];  Service: Gynecology;  Laterality: N/A;  Wide Excision of old [place]   Social History:  reports that she quit smoking about 18 months ago. Her smoking use included  cigarettes. She has never used smokeless tobacco. She reports that she does not currently use alcohol. She reports that she does not currently use drugs after having used the following drugs: Cocaine and Marijuana. Family History:  Family History  Problem Relation Age of Onset   Cirrhosis Mother    Early death Mother    Alcohol abuse Mother    Diabetes type II Father    Alcohol abuse Father    Diabetes type II Sister    Early death Brother    Alcohol abuse Son    Anesthesia problems Neg Hx    Hypotension Neg Hx    Malignant hyperthermia Neg Hx    Pseudochol deficiency Neg Hx    Colon cancer Neg Hx    Colon polyps Neg Hx    Esophageal cancer Neg Hx    Rectal cancer Neg Hx    Stomach cancer Neg Hx      HOME MEDICATIONS: Allergies as of 06/13/2021       Reactions   Bee Venom Shortness Of Breath, Swelling   Bodily Swelling   Losartan Other (See Comments)   Nosebleeds per patient report.    Naproxen Other (See Comments)   Acid reflux   Penicillins Nausea Only   Has patient had a PCN reaction causing immediate rash, facial/tongue/throat swelling, SOB or lightheadedness with hypotension: no Has patient had a PCN reaction causing severe rash involving mucus membranes or skin necrosis: no Has patient had a PCN reaction that required hospitalization no Has patient had a PCN reaction occurring within the last 10 years: no If all of the above answers are "NO", then may proceed with Cephalosporin    Lisinopril    Sinus congestion        Medication List        Accurate as of June 13, 2021 10:17 AM. If you have any questions, ask your nurse or doctor.          Accu-Chek Guide Me w/Device Kit 1 Device by Does not apply route 3 (three) times daily.   Accu-Chek Guide test strip Generic drug: glucose blood 1 each by Other route 3 (three) times daily. Use as instructed   apixaban 5 MG Tabs tablet Commonly known as: Eliquis Take 1 tablet (5 mg total) by mouth 2 (two)  times daily.   aspirin EC 81 MG tablet Take 1 tablet (81 mg total) by mouth daily. Swallow whole.   BD Pen Needle Nano U/F 32G X 4 MM Misc Generic drug: Insulin Pen Needle Inject 1 Device into the skin in the morning, at noon, in the  evening, and at bedtime.   carvedilol 25 MG tablet Commonly known as: COREG Take 1 tablet (25 mg total) by mouth 2 (two) times daily with a meal.   Dexcom G6 Receiver Devi 1 Device by Does not apply route as directed.   Dexcom G6 Sensor Misc 1 Device by Does not apply route as directed.   Dexcom G6 Transmitter Misc 1 Device by Does not apply route as directed.   furosemide 20 MG tablet Commonly known as: Lasix Take 1 tablet (20 mg total) by mouth daily.   gabapentin 100 MG capsule Commonly known as: NEURONTIN Take 1 capsule (100 mg total) by mouth 3 (three) times daily.   hydrALAZINE 25 MG tablet Commonly known as: APRESOLINE Take 1 tablet (25 mg total) by mouth 3 (three) times daily.   isosorbide mononitrate 30 MG 24 hr tablet Commonly known as: IMDUR Take 2 tablets (60 mg total) by mouth daily.   Lantus SoloStar 100 UNIT/ML Solostar Pen Generic drug: insulin glargine Inject 25 Units into the skin daily.   nitroGLYCERIN 0.4 MG SL tablet Commonly known as: NITROSTAT Place 1 tablet (0.4 mg total) under the tongue every 5 (five) minutes as needed for chest pain.   NovoLOG FlexPen 100 UNIT/ML FlexPen Generic drug: insulin aspart Inject 8 Units into the skin 3 (three) times daily with meals.   pantoprazole 40 MG tablet Commonly known as: PROTONIX Take 1 tablet (40 mg total) by mouth daily.         OBJECTIVE:   Vital Signs: BP 136/88 (BP Location: Left Arm, Patient Position: Sitting, Cuff Size: Normal)   Pulse (!) 108   Ht _0  (1.575 m)   Wt 237 lb 12.8 oz (107.9 kg)   LMP 08/21/2011   SpO2 97%   BMI 43.49 kg/m   Wt Readings from Last 3 Encounters:  06/13/21 237 lb 12.8 oz (107.9 kg)  05/10/21 220 lb (99.8 kg)   03/13/21 221 lb 6.1 oz (100.4 kg)     Exam: General: Pt appears well and is in NAD  Lungs: Clear with good BS bilat with no rales, rhonchi, or wheezes  Heart: RRR   Extremities: No pretibial edema.   Neuro: MS is good with appropriate affect, pt is alert and Ox3    DM foot exam:06/13/2021   The skin of the feet is intact without sores or ulcerations. The pedal pulses are undetectable The sensation is decreased  to a screening 5.07, 10 gram monofilament bilaterally            DATA REVIEWED:  Lab Results  Component Value Date   HGBA1C >15.0 03/09/2021   HGBA1C 7.2 (H) 07/10/2020   HGBA1C 7.9 05/22/2020   Lab Results  Component Value Date   MICROALBUR 2.0 (H) 06/09/2019   LDLCALC 17 07/05/2020   CREATININE 2.13 (H) 05/10/2021   Lab Results  Component Value Date   MICRALBCREAT 1.0 06/09/2019     Lab Results  Component Value Date   CHOL 58 07/05/2020   HDL 28 (L) 07/05/2020   LDLCALC 17 07/05/2020   TRIG 51 07/05/2020   CHOLHDL 2.1 07/05/2020       Results for DALASIA, PREDMORE A (MRN [id number]) as of 06/13/2021 10:14  Ref. Range 05/10/2021 09:02  Sodium Latest Ref Range: 135 - 145 mmol/L 132 (L)  Potassium Latest Ref Range: 3.5 - 5.1 mmol/L 4.3  Chloride Latest Ref Range: 98 - 111 mmol/L 100  CO2 Latest Ref Range: 22 - 32 mmol/L 24  Glucose  Latest Ref Range: 70 - 99 mg/dL 501 (HH)  BUN Latest Ref Range: 6 - 20 mg/dL 24 (H)  Creatinine Latest Ref Range: 0.44 - 1.00 mg/dL 2.13 (H)  Calcium Latest Ref Range: 8.9 - 10.3 mg/dL 8.8 (L)  Anion gap Latest Ref Range: 5 - 15  8  Magnesium Latest Ref Range: 1.7 - 2.4 mg/dL 1.9  Alkaline Phosphatase Latest Ref Range: 38 - 126 U/L 114  Albumin Latest Ref Range: 3.5 - 5.0 g/dL 3.7  AST Latest Ref Range: 15 - 41 U/L 17  ALT Latest Ref Range: 0 - 44 U/L 12  Total Protein Latest Ref Range: 6.5 - 8.1 g/dL 7.4  Total Bilirubin Latest Ref Range: 0.3 - 1.2 mg/dL 0.6  GFR, Estimated Latest Ref Range: >60 mL/min 27 (L)     ASSESSMENT / PLAN / RECOMMENDATIONS:   1) Type 2 Diabetes Mellitus, Poorly controlled, With Neuropathic and CKD IV and macrovascular complications - Most recent A1c of  13.4 %. Goal A1c < 7.0 %.    - Her A1c has trended down from > 15.0 % to 13.4% . Pt with social determinants, she is staying with a friend. Has care taker in the morning. Pt with speech and cognitive impairments, she was able to show me that she takes Novolog 16 units if Bg is high ? Discussed fatal consequences of hypoglycemia  - She assures me that she takes novolog with meals , she tell me she takes it at 9 am, 1 pm and 6 pm  -Metformin has been stopped due to low GFR -Main barriers to diabetes self care is memory issues and  CVA -She has been  able to demonstrate appropriate insulin pen use -We will prescribe Dexcom to [facility] this time, unable to obtain from [employer]  - Will adjust insulin as below  - Will re-start Trulicity   MEDICATIONS: Increase Lantus to 30 units daily Increase NovoLog 12 units with each meal Start trulicity 5.05 mg weekly   EDUCATION / INSTRUCTIONS: BG monitoring instructions: Patient is instructed to check her blood sugars 2 times a day, fasting and supper. Call [facility] if: BG persistently < 70  I reviewed the Rule of 15 for the treatment of hypoglycemia in detail with the patient. Literature supplied.   2) Diabetic complications:  Eye: Does not have known diabetic retinopathy.   Neuro/ Feet: Does  have known diabetic peripheral neuropathy .  Renal: Patient does  have known baseline CKD III     3) Right leg pain :  - This seems to be musculoskeletal as its associated with back pain  - I was unable to palpate DP pulses - will proceed with ABI  - Pt to contact PCP for further evaluation  - IN the meantime she can only use tylenbol for pain due to CKD IV  F/U in 3 months   I spent 25 minutes preparing to see the patient by review of recent labs, imaging and  procedures, obtaining and reviewing separately obtained history, communicating with the patient/family or caregiver, ordering medications, tests or procedures, and documenting clinical information in the EHR including the differential Dx, treatment, and any further evaluation and other management    Signed electronically by: Mack Guise, MD  [facility]  [facility] [address] [place], [place] [address] Phone: [phone] FAX: [phone]   CC: Inda Coke, [address] [place] [address] Phone: [phone]  Fax: [phone]  Return to Endocrinology  clinic as below: No future appointments.

## 2021-06-13 NOTE — Patient Instructions (Addendum)
-   Increase Lantus to 30 units daily  - Novolog 12 units with Breakfast, Lunch and Dinner  - Start  Trulicity 5.68 mg weekly       HOW TO TREAT LOW BLOOD SUGARS (Blood sugar LESS THAN 70 MG/DL) Please follow the RULE OF 15 for the treatment of hypoglycemia treatment (when your (blood sugars are less than 70 mg/dL)   STEP 1: Take 15 grams of carbohydrates when your blood sugar is low, which includes:  3-4 GLUCOSE TABS  OR 3-4 OZ OF JUICE OR REGULAR SODA OR ONE TUBE OF GLUCOSE GEL    STEP 2: RECHECK blood sugar in 15 MINUTES STEP 3: If your blood sugar is still low at the 15 minute recheck --> then, go back to STEP 1 and treat AGAIN with another 15 grams of carbohydrates.

## 2021-06-14 ENCOUNTER — Telehealth: Payer: Self-pay | Admitting: Pharmacy Technician

## 2021-06-14 ENCOUNTER — Other Ambulatory Visit (HOSPITAL_COMMUNITY): Payer: Self-pay

## 2021-06-14 NOTE — Telephone Encounter (Signed)
Patient Advocate Encounter   Received notification from CoverMyMeds that prior authorization for Trulicity 0.75mg  is required by his/her insurance [place] [employer].    PA submitted on 06/14/21 Confirmation #: [id number] W Status is pending    [facility] will continue to follow:   Armanda Magic, CPhT [facility] Phone: [phone] Fax:  [phone]

## 2021-06-15 ENCOUNTER — Other Ambulatory Visit: Payer: Self-pay | Admitting: Physician Assistant

## 2021-06-15 ENCOUNTER — Emergency Department (HOSPITAL_COMMUNITY)
Admission: EM | Admit: 2021-06-15 | Discharge: 2021-06-15 | Disposition: A | Discharge status: Home / Self Care | Payer: Medicaid Other | Attending: Emergency Medicine | Admitting: Emergency Medicine

## 2021-06-15 ENCOUNTER — Other Ambulatory Visit (HOSPITAL_COMMUNITY): Payer: Self-pay

## 2021-06-15 DIAGNOSIS — R519 Headache, unspecified: Secondary | ICD-10-CM | POA: Insufficient documentation

## 2021-06-15 DIAGNOSIS — Z5321 Procedure and treatment not carried out due to patient leaving prior to being seen by health care provider: Secondary | ICD-10-CM | POA: Diagnosis not present

## 2021-06-15 DIAGNOSIS — I10 Essential (primary) hypertension: Secondary | ICD-10-CM

## 2021-06-15 NOTE — ED Triage Notes (Signed)
History of stroke, Son states she has a headache.

## 2021-06-15 NOTE — ED Triage Notes (Signed)
Patient decided to leave in the middle of triage

## 2021-06-19 ENCOUNTER — Other Ambulatory Visit (HOSPITAL_COMMUNITY): Payer: Self-pay

## 2021-06-19 ENCOUNTER — Telehealth: Payer: Self-pay

## 2021-06-19 ENCOUNTER — Encounter: Payer: Self-pay | Admitting: Internal Medicine

## 2021-06-19 ENCOUNTER — Other Ambulatory Visit: Payer: Self-pay

## 2021-06-19 ENCOUNTER — Ambulatory Visit (HOSPITAL_COMMUNITY)
Admission: RE | Admit: 2021-06-19 | Discharge: 2021-06-19 | Disposition: A | Discharge status: Home / Self Care | Payer: Medicaid Other | Source: Ambulatory Visit | Attending: Internal Medicine | Admitting: Internal Medicine

## 2021-06-19 ENCOUNTER — Encounter: Payer: Self-pay | Admitting: Physician Assistant

## 2021-06-19 ENCOUNTER — Ambulatory Visit (INDEPENDENT_AMBULATORY_CARE_PROVIDER_SITE_OTHER): Payer: Medicaid Other | Admitting: Physician Assistant

## 2021-06-19 ENCOUNTER — Telehealth: Payer: Self-pay | Admitting: Cardiology

## 2021-06-19 VITALS — BP 122/80 | HR 96 | Temp 98.0°F | Ht 62.0 in | Wt 236.0 lb

## 2021-06-19 DIAGNOSIS — I482 Chronic atrial fibrillation, unspecified: Secondary | ICD-10-CM

## 2021-06-19 DIAGNOSIS — R0989 Other specified symptoms and signs involving the circulatory and respiratory systems: Secondary | ICD-10-CM | POA: Insufficient documentation

## 2021-06-19 DIAGNOSIS — R0602 Shortness of breath: Secondary | ICD-10-CM

## 2021-06-19 DIAGNOSIS — R0609 Other forms of dyspnea: Secondary | ICD-10-CM

## 2021-06-19 DIAGNOSIS — M546 Pain in thoracic spine: Secondary | ICD-10-CM

## 2021-06-19 DIAGNOSIS — G8929 Other chronic pain: Secondary | ICD-10-CM

## 2021-06-19 DIAGNOSIS — R040 Epistaxis: Secondary | ICD-10-CM | POA: Diagnosis not present

## 2021-06-19 LAB — COMPREHENSIVE METABOLIC PANEL
ALT: 11 U/L (ref 0–35)
AST: 14 U/L (ref 0–37)
Albumin: 4.1 g/dL (ref 3.5–5.2)
Alkaline Phosphatase: 105 U/L (ref 39–117)
BUN: 28 mg/dL — ABNORMAL HIGH (ref 6–23)
CO2: 25 mEq/L (ref 19–32)
Calcium: 9.7 mg/dL (ref 8.4–10.5)
Chloride: 99 mEq/L (ref 96–112)
Creatinine, Ser: 1.93 mg/dL — ABNORMAL HIGH (ref 0.40–1.20)
GFR: 28.94 mL/min — ABNORMAL LOW (ref >60.00)
Glucose, Bld: 370 mg/dL — ABNORMAL HIGH (ref 70–99)
Potassium: 4.1 mEq/L (ref 3.5–5.1)
Sodium: 132 mEq/L — ABNORMAL LOW (ref 135–145)
Total Bilirubin: 0.3 mg/dL (ref 0.2–1.2)
Total Protein: 7.5 g/dL (ref 6.0–8.3)

## 2021-06-19 LAB — CBC WITH DIFFERENTIAL/PLATELET
Basophils Absolute: 0 10*3/uL (ref 0.0–0.1)
Basophils Relative: 0.5 % (ref 0.0–3.0)
Eosinophils Absolute: 0.2 10*3/uL (ref 0.0–0.7)
Eosinophils Relative: 2.1 % (ref 0.0–5.0)
HCT: 36 % (ref 36.0–46.0)
Hemoglobin: 11 g/dL — ABNORMAL LOW (ref 12.0–15.0)
Lymphocytes Relative: 27.7 % (ref 12.0–46.0)
Lymphs Abs: 2.3 10*3/uL (ref 0.7–4.0)
MCHC: 30.4 g/dL (ref 30.0–36.0)
MCV: 77 fl — ABNORMAL LOW (ref 78.0–100.0)
Monocytes Absolute: 0.6 10*3/uL (ref 0.1–1.0)
Monocytes Relative: 7.7 % (ref 3.0–12.0)
Neutro Abs: 5.1 10*3/uL (ref 1.4–7.7)
Neutrophils Relative %: 62 % (ref 43.0–77.0)
Platelets: 279 10*3/uL (ref 150.0–400.0)
RBC: 4.68 Mil/uL (ref 3.87–5.11)
RDW: 15.5 % (ref 11.5–15.5)
WBC: 8.3 10*3/uL (ref 4.0–10.5)

## 2021-06-19 LAB — TSH: TSH: 1.82 u[IU]/mL (ref 0.35–5.50)

## 2021-06-19 NOTE — Telephone Encounter (Signed)
Left message to return call 

## 2021-06-19 NOTE — Telephone Encounter (Signed)
Patient Advocate Encounter  Prior Authorization for [employer], Dexcom Receiver & Dexcom Sensor has been approved.    PA# [id number], [id number], [id number]  Effective dates: 06/19/21 through 12/16/21  Per Test Claim Patients co-pay is $0.   Spoke with Pharmacy to Process.  Patient Advocate Fax:  [phone]

## 2021-06-19 NOTE — Progress Notes (Signed)
Robin [name] is a 55 y.o. female here for a follow up of a Atrial Fibrillation.  History of Present Illness:   Chief Complaint  Patient presents with  . Atrial Fibrillation  Robin [name] presented to today's visit with her sister.   HPI  A-Fib/SOB [name] recently visited the ED with c/o SOB and a nose bleed on 06/15/21. She didn't stay to be evaluated due to being informed it'll be a six hour wait. Upon arriving at the ED, her nosebleed had stopped but she was still experiencing SOB. Kobi described the SOB as her needing to breathe deeply and feeling like she couldn't catch her breath. [name] is still experiencing slight SOB and occasional nose bleeds. Currently compliant with taking Eliquis 5 mg with no adverse effects. She is set to follow up with Cardiology after not being as compliant in the past.    Nose bleeds Has recurrent nose bleeds. While laughing in the office today developed slight nose bleed. Has had these since childhood   Diabetes At this time Robin [name]'s sister admitted she has been eating more sweets and sugary drinks rather than improving her diet. They believe this to be the cause of her increased A1c reading.  Despite this Robin [name] is still compliant with novolog 12 units TID, Lantus 30 units, trulicity 8.29 mg weekly, and lasix 20 mg daily.  Denies hypoglycemic or hyperglycemic episodes or symptoms.   Lab Results  Component Value Date   HGBA1C 13.4 (A) 06/13/2021    Back Pain Pt also expressed she has been experiencing central back pain following her visit to her endocrinologist. She has admitted she usually has back pain but describes it has gotten worse over the past week. Robin [name] states it is tender upon palpation to the area and reports she has tried lidocaine patches as well as gabapentin 300 mg TID which provided minor relief. Denies stool/bladder incontinence.  Right Leg Pain According to her sister, Robin [name] has been having right leg pain that had been  going on for two days prior to her visit with Dr. Kelton Pillar, Endocrinology. During the visit, it was found that her right leg pain seemed to be musculoskeletal since it was associated with her back pain. Another discovery was that her DP pulses could not be felt. Due to this she is set to undergo an [place] later today.      Past Medical History:  Diagnosis Date  . A-fib ([place])   . Abdominal aortic aneurysm (AAA)   . Allergy   . Anemia   . Arthritis   . Atrial fibrillation ([place])   . CHF (congestive heart failure) ([place])    a. EF at 30-35% by echo in 08/2018 b. EF at 35% by repeat echo in 05/2019  . Chronic abdominal pain   . Chronic atrial fibrillation ([place])   . Cocaine abuse ([place])   . COPD (chronic obstructive pulmonary disease) ([place])   . Diabetes mellitus without complication ([place])   . Essential hypertension, benign   . Expressive aphasia   . Expressive aphasia    post CVA  . Fatty liver   . GERD (gastroesophageal reflux disease)   . Gout 2016  . Normal coronary arteries    3/10 - following abnormal Myoview  . Ovarian cyst   . Stroke ([place]) 12/26/2019   Right sided weakness, and expressive aphasia  . Stroke ([place])   . Thoracic ascending aortic aneurysm    4 cm 10/31/19 CTA  . Type 2 diabetes mellitus ([place])  type II     Social History   Tobacco Use  . Smoking status: Former    Years: 29.00    Types: Cigarettes    Quit date: 12/2019    Years since quitting: 1.5  . Smokeless tobacco: Never  Vaping Use  . Vaping Use: Never used  Substance Use Topics  . Alcohol use: Not Currently    Comment: occ  . Drug use: Not Currently    Types: Cocaine, Marijuana    Past Surgical History:  Procedure Laterality Date  . ABDOMINAL HYSTERECTOMY  09/10/2011   Procedure: HYSTERECTOMY ABDOMINAL;  Surgeon: Jonnie Kind, MD;  Location: [facility];  Service: Gynecology;  Laterality: N/A;  Abdominal hysterectomy  . CESAREAN SECTION  [id number], and 1994  . CHOLECYSTECTOMY  1995  . IR 3D  INDEPENDENT WKST  03/16/2020  . IR ANGIO INTRA EXTRACRAN SEL INTERNAL CAROTID BILAT MOD SED  03/16/2020  . IR ANGIO VERTEBRAL SEL SUBCLAVIAN INNOMINATE UNI L MOD SED  03/16/2020  . IR ANGIO VERTEBRAL SEL VERTEBRAL UNI R MOD SED  03/16/2020  . IR CT HEAD LTD  12/27/2019  . IR PERCUTANEOUS ART THROMBECTOMY/INFUSION INTRACRANIAL INC DIAG ANGIO  12/27/2019  . IR US GUIDE VASC ACCESS RIGHT  12/27/2019  . IR US GUIDE VASC ACCESS RIGHT  03/16/2020  . RADIOLOGY WITH ANESTHESIA N/A 12/27/2019   Procedure: IR WITH ANESTHESIA;  Surgeon: Luanne Bras, MD;  Location: [facility];  Service: Radiology;  Laterality: N/A;  . SCAR REVISION  09/10/2011   Procedure: SCAR REVISION;  Surgeon: Jonnie Kind, MD;  Location: [facility];  Service: Gynecology;  Laterality: N/A;  Wide Excision of old Cicatrix  . TUBAL LIGATION  1994    Family History  Problem Relation Age of Onset  . Cirrhosis Mother   . Early death Mother   . Alcohol abuse Mother   . Diabetes type II Father   . Alcohol abuse Father   . Diabetes type II Sister   . Early death Brother   . Alcohol abuse Son   . Anesthesia problems Neg Hx   . Hypotension Neg Hx   . Malignant hyperthermia Neg Hx   . Pseudochol deficiency Neg Hx   . Colon cancer Neg Hx   . Colon polyps Neg Hx   . Esophageal cancer Neg Hx   . Rectal cancer Neg Hx   . Stomach cancer Neg Hx     Allergies  Allergen Reactions  . Bee Venom Shortness Of Breath and Swelling    Bodily Swelling  . Losartan Other (See Comments)    Nosebleeds per patient report.   . Naproxen Other (See Comments)    Acid reflux  . Penicillins Nausea Only    Has patient had a PCN reaction causing immediate rash, facial/tongue/throat swelling, SOB or lightheadedness with hypotension: no Has patient had a PCN reaction causing severe rash involving mucus membranes or skin necrosis: no Has patient had a PCN reaction that required hospitalization no Has patient had a PCN reaction occurring within the last 10 years:  no If all of the above answers are "NO", then may proceed with Cephalosporin   . Lisinopril     Sinus congestion    Current Medications:   Current Outpatient Medications:  .  Accu-Chek Softclix Lancets lancets, Use as instructed, Disp: 100 each, Rfl: 12 .  apixaban (ELIQUIS) 5 MG TABS tablet, Take 1 tablet (5 mg total) by mouth 2 (two) times daily., Disp: 180 tablet, Rfl: 0 .  aspirin  EC 81 MG tablet, Take 1 tablet (81 mg total) by mouth daily. Swallow whole., Disp: 90 tablet, Rfl: 3 .  BD PEN NEEDLE NANO U/F 32G X 4 MM MISC, Inject 1 Device into the skin in the morning, at noon, in the evening, and at bedtime., Disp: 400 each, Rfl: 3 .  Blood Glucose Monitoring Suppl (ACCU-CHEK GUIDE ME) w/Device KIT, 1 Device by Does not apply route 3 (three) times daily., Disp: 1 kit, Rfl: 0 .  carvedilol (COREG) 25 MG tablet, TAKE 1 TABLET BY MOUTH TWICE DAILY WITH A MEAL, Disp: 180 tablet, Rfl: 0 .  Continuous Blood Gluc Receiver (DEXCOM G6 RECEIVER) DEVI, 1 Device by Does not apply route as directed., Disp: 1 each, Rfl: 0 .  Continuous Blood Gluc Sensor (DEXCOM G6 SENSOR) MISC, 1 Device by Does not apply route as directed., Disp: 9 each, Rfl: 3 .  Continuous Blood Gluc Transmit (DEXCOM G6 TRANSMITTER) MISC, 1 Device by Does not apply route as directed., Disp: 1 each, Rfl: 3 .  furosemide (LASIX) 20 MG tablet, Take 1 tablet by mouth once daily, Disp: 90 tablet, Rfl: 0 .  gabapentin (NEURONTIN) 100 MG capsule, Take 1 capsule (100 mg total) by mouth 3 (three) times daily., Disp: 270 capsule, Rfl: 3 .  glucose blood (ACCU-CHEK GUIDE) test strip, 1 each by Other route 3 (three) times daily. Use as instructed, Disp: 300 each, Rfl: 3 .  hydrALAZINE (APRESOLINE) 25 MG tablet, TAKE 1 TABLET BY MOUTH THREE TIMES DAILY, Disp: 270 tablet, Rfl: 0 .  insulin aspart (NOVOLOG FLEXPEN) 100 UNIT/ML FlexPen, Inject 12 Units into the skin 3 (three) times daily with meals., Disp: 30 mL, Rfl: 3 .  insulin glargine (LANTUS  SOLOSTAR) 100 UNIT/ML Solostar Pen, Inject 30 Units into the skin daily., Disp: 30 mL, Rfl: 3 .  isosorbide mononitrate (IMDUR) 30 MG 24 hr tablet, Take 2 tablets by mouth once daily, Disp: 180 tablet, Rfl: 0 .  nitroGLYCERIN (NITROSTAT) 0.4 MG SL tablet, Place 1 tablet (0.4 mg total) under the tongue every 5 (five) minutes as needed for chest pain., Disp: 30 tablet, Rfl: 12 .  Dulaglutide (TRULICITY) 1.96 QI/2.9NL SOPN, Inject 0.75 mg into the skin once a week. (Patient not taking: Reported on 06/19/2021), Disp: 6 mL, Rfl: 3 .  pantoprazole (PROTONIX) 40 MG tablet, Take 1 tablet by mouth once daily (Patient not taking: Reported on 06/19/2021), Disp: 90 tablet, Rfl: 0   Review of Systems:   ROS Negative unless otherwise specified per HPI. Vitals:   Vitals:   06/19/21 1256  BP: 122/80  Pulse: 96  Temp: 98 F (36.7 C)  TempSrc: Temporal  SpO2: 97%  Weight: 236 lb (107 kg)  Height: 5' 2" (1.575 m)     Body mass index is 43.16 kg/m.  Physical Exam:   Physical Exam Vitals and nursing note reviewed.  Constitutional:      General: She is not in acute distress.    Appearance: She is well-developed. She is not ill-appearing or toxic-appearing.  HENT:     Head:     Comments: Slight blood drainage in L nostril Cardiovascular:     Rate and Rhythm: Normal rate. Rhythm irregularly irregular.     Pulses: Normal pulses.     Heart sounds: Normal heart sounds, S1 normal and S2 normal.  Pulmonary:     Effort: Pulmonary effort is normal.     Breath sounds: Normal breath sounds.  Musculoskeletal:     Comments: Tenderness to midline  thoracic spine  Skin:    General: Skin is warm and dry.  Neurological:     Mental Status: She is alert.     GCS: GCS eye subscore is 4. GCS verbal subscore is 5. GCS motor subscore is 6.  Psychiatric:        Speech: Speech normal.        Behavior: Behavior normal. Behavior is cooperative.   Sprayed afrin on swab and placed in nose, tolerated well, nasal  bleeding stopped.  Assessment and Plan:   Epistaxis Well controlled in office I recommended that she pick up Afrin OTC and use as directed If uncontrolled bleeding >15 min, needs to go to the ER  Atrial fibrillation, chronic (HCC) Endorses compliance with medications I recommended that she follow-up with cardiology for regular care, information given to sister to help coordinate this Follow-up as needed  SOB (shortness of breath) Unclear etiology; resolved at this time Vitals stable; no obvious signs of volume overload on my exam Will update blood work for further evaluation and mgmt  Chronic midline thoracic back pain Recommend xray imaging and sports medicine referral Sister and patient would like to pursue work-up of leg first I did go ahead and put in order for back xray should they decide to proceed with this, and instructions of where to have this done Also advised as follows: --Increase gabapentin medication. You are currently on 100 mg three times daily.  Starting today: take 100 mg in the morning, 100 mg in the afternoon and 200 mg at bedtime. Next week: take 100 mg in morning, 200 mg in the afternoon and 200 mg at bedtime The following week: may take 200 mg three times daily --May take [phone] mg tylenol 3 times daily  If new/worsening symptoms, new weakness, bowel/bladder incontinence or other concerns -- she was advised to proceed to the ER   I,Havlyn C Ratchford,acting as a scribe for [employer], PA.,have documented all relevant documentation on the behalf of Inda Coke, PA,as directed by  Inda Coke, PA while in the presence of Inda Coke, [place].  I, Inda Coke, [place], have reviewed all documentation for this visit. The documentation on 06/19/21 for the exam, diagnosis, procedures, and orders are all accurate and complete.   Inda Coke, PA-C

## 2021-06-19 NOTE — Telephone Encounter (Signed)
Patient Advocate Encounter   Received notification from office that prior authorization for Dexcom is required by his/her insurance [place] [employer].  PA submitted on 06/19/21  Conf#: [id number] W (sensor)  Status is pending    [facility] will continue to follow:  Patient Advocate Fax:  [phone]

## 2021-06-19 NOTE — Patient Instructions (Addendum)
It was great to see you!  [phone] -- this is the cardiology office. Please call and schedule with Bernerd Pho PA or other provider that's available.  Increase gabapentin medication. You are currently on 100 mg three times daily.  Starting today: take 100 mg in the morning, 100 mg in the afternoon and 200 mg at bedtime. Next week: take 100 mg in morning, 200 mg in the afternoon and 200 mg at bedtime The following week: may take 200 mg three times daily  May take [phone] mg tylenol 3 times daily  An order for an xray has been put in for you. To get your xray, you can walk in at the [facility] location without a scheduled appointment.  The address is [address]. It is across the street from [place] is located in the basement.  Hours of operation are M-F 8:30am to 5:00pm. Please note that they are closed for lunch between 12:30 and 1:00pm.    Pick up oxymetazoline hcl nasal spray over the counter (also called Afrin). Moisten a swab and stick swab in your nose for 5 min to help bleeding.  Take care,  Inda Coke PA-C

## 2021-06-19 NOTE — Telephone Encounter (Signed)
Pt c/o Shortness Of Breath: STAT if SOB developed within the last 24 hours or pt is noticeably SOB on the phone  1. Are you currently SOB (can you hear that pt is SOB on the phone)? No   2. How long have you been experiencing SOB? One month  3. Are you SOB when sitting or when up moving around? Moving around   4. Are you currently experiencing any other symptoms? no

## 2021-06-20 ENCOUNTER — Other Ambulatory Visit (HOSPITAL_COMMUNITY): Payer: Self-pay

## 2021-06-20 ENCOUNTER — Other Ambulatory Visit: Payer: Self-pay | Admitting: Physician Assistant

## 2021-06-20 DIAGNOSIS — R710 Precipitous drop in hematocrit: Secondary | ICD-10-CM

## 2021-06-20 DIAGNOSIS — E1122 Type 2 diabetes mellitus with diabetic chronic kidney disease: Secondary | ICD-10-CM

## 2021-06-20 DIAGNOSIS — Z1211 Encounter for screening for malignant neoplasm of colon: Secondary | ICD-10-CM

## 2021-06-20 NOTE — Telephone Encounter (Signed)
Left message to return call 

## 2021-06-20 NOTE — Telephone Encounter (Signed)
error 

## 2021-06-21 NOTE — Telephone Encounter (Signed)
    Has she been able to follow daily weights? Any associated orthopnea or edema? Would see if we can get a repeat echo prior to her visit given her history of cardiomyopathy. Association for echo would be dyspnea on exertion and prior cardiomyopathy.   Thanks,  [place]

## 2021-06-21 NOTE — Telephone Encounter (Signed)
Spoke with sister who states that they will start tracking her weights. And she agrees with plan of care.

## 2021-06-21 NOTE — Telephone Encounter (Signed)
Returned call to pt and spoke with sister. Sister states that the pt was seen by PCP for SOB when walking. PCP suggest that pt be seen by cardiology. Appt. Made for 07/27/21 with Bernerd Pho, PA-C.

## 2021-07-02 ENCOUNTER — Encounter (HOSPITAL_COMMUNITY): Payer: Self-pay | Admitting: Emergency Medicine

## 2021-07-02 ENCOUNTER — Emergency Department (HOSPITAL_COMMUNITY)
Admission: EM | Admit: 2021-07-02 | Discharge: 2021-07-02 | Disposition: A | Discharge status: Home / Self Care | Payer: Medicaid Other | Attending: Emergency Medicine | Admitting: Emergency Medicine

## 2021-07-02 ENCOUNTER — Emergency Department (HOSPITAL_COMMUNITY): Payer: Medicaid Other

## 2021-07-02 DIAGNOSIS — M791 Myalgia, unspecified site: Secondary | ICD-10-CM

## 2021-07-02 DIAGNOSIS — J449 Chronic obstructive pulmonary disease, unspecified: Secondary | ICD-10-CM | POA: Insufficient documentation

## 2021-07-02 DIAGNOSIS — I509 Heart failure, unspecified: Secondary | ICD-10-CM | POA: Diagnosis not present

## 2021-07-02 DIAGNOSIS — R040 Epistaxis: Secondary | ICD-10-CM | POA: Diagnosis not present

## 2021-07-02 DIAGNOSIS — E119 Type 2 diabetes mellitus without complications: Secondary | ICD-10-CM | POA: Insufficient documentation

## 2021-07-02 DIAGNOSIS — Z87891 Personal history of nicotine dependence: Secondary | ICD-10-CM | POA: Insufficient documentation

## 2021-07-02 DIAGNOSIS — R519 Headache, unspecified: Secondary | ICD-10-CM | POA: Insufficient documentation

## 2021-07-02 DIAGNOSIS — I110 Hypertensive heart disease with heart failure: Secondary | ICD-10-CM | POA: Diagnosis not present

## 2021-07-02 IMAGING — CT CT HEAD W/O CM
3 series · 16 of 47 positions shown, 19 images · non-contrast
Comparison: [DATE]

CLINICAL DATA: History of stroke.  Nose bleed.

EXAM:
CT HEAD WITHOUT CONTRAST
TECHNIQUE: Contiguous axial images were obtained from the base of the skull
through the vertex without intravenous contrast.

[Series 2: head w o · axial · 0.39mm/px · z∈[-29,+96]mm · 10 of 31 slices shown, 13 images]
[im 3/31  brain]
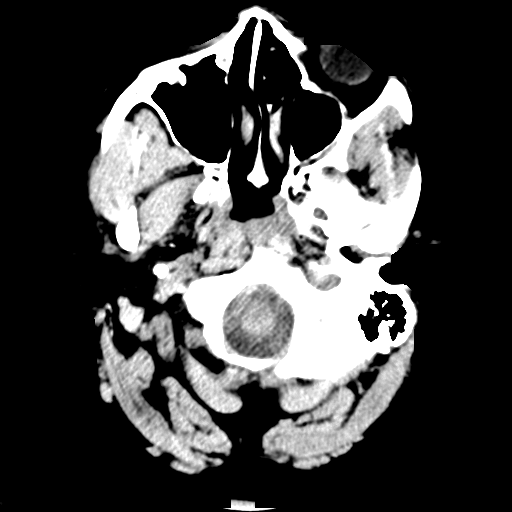
[im 3/31  bone]
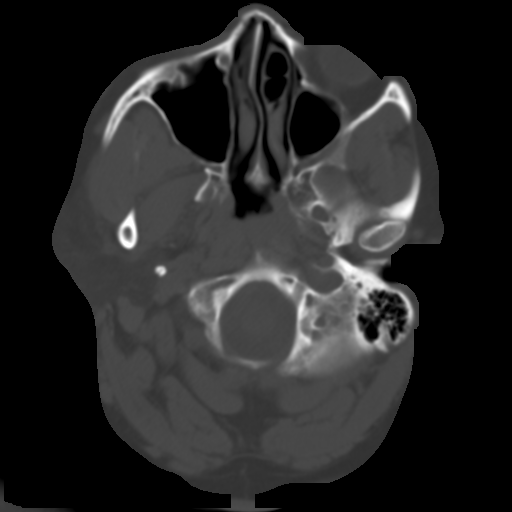
[im 6/31  brain]
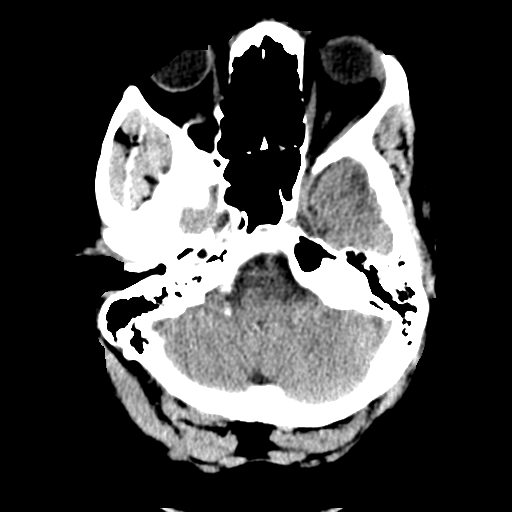
[im 9/31  brain]
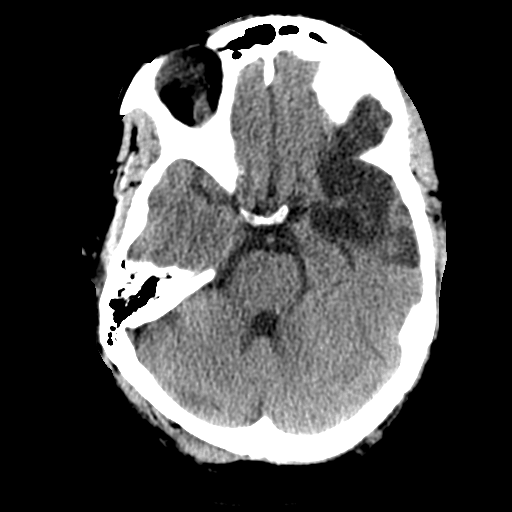
[im 11/31  brain]
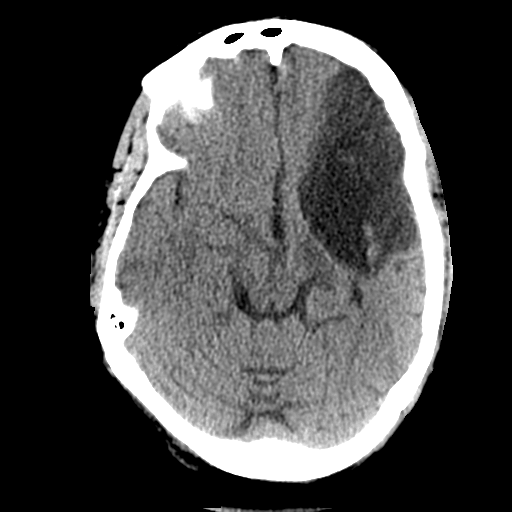
[im 14/31  brain]
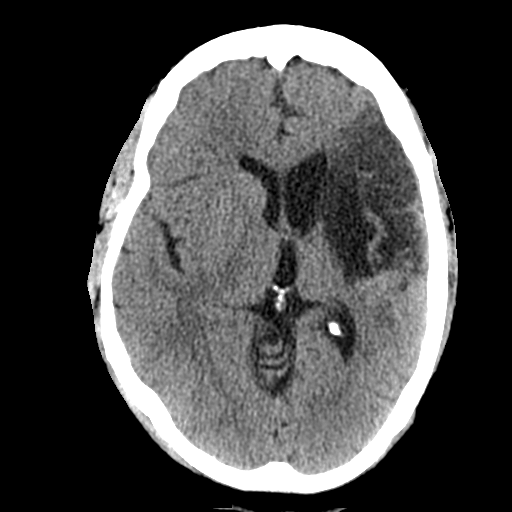
[im 14/31  bone]
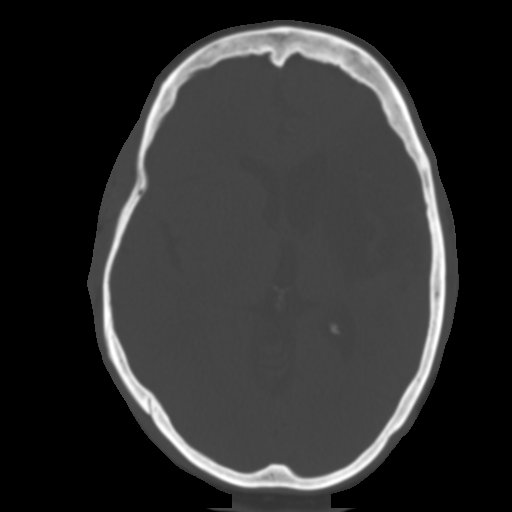
[im 17/31  brain]
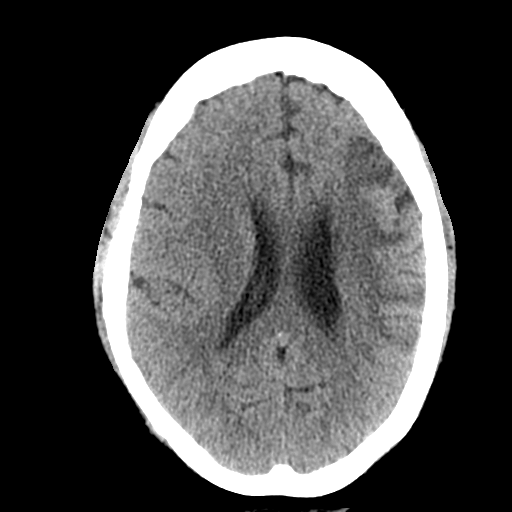
[im 20/31  brain]
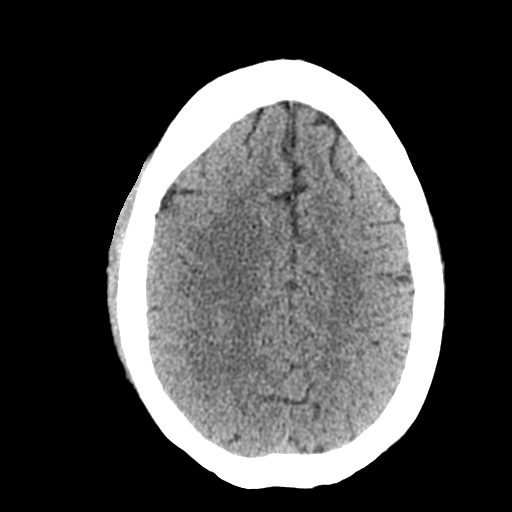
[im 23/31  brain]
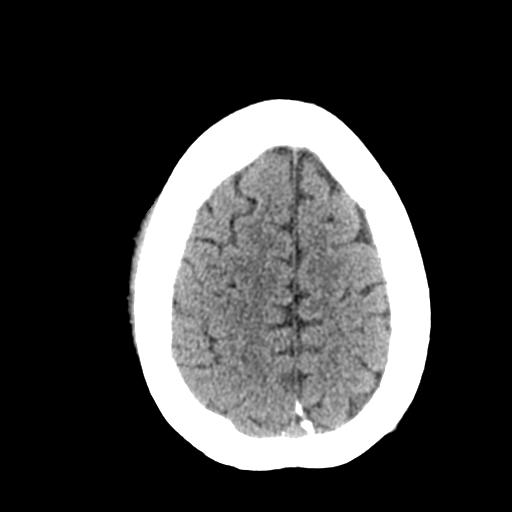
[im 25/31  brain]
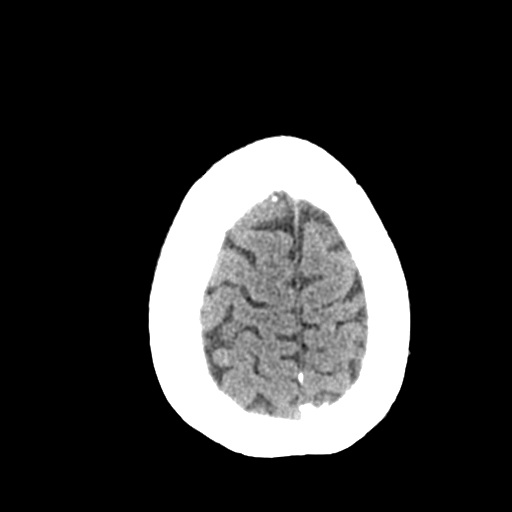
[im 25/31  bone]
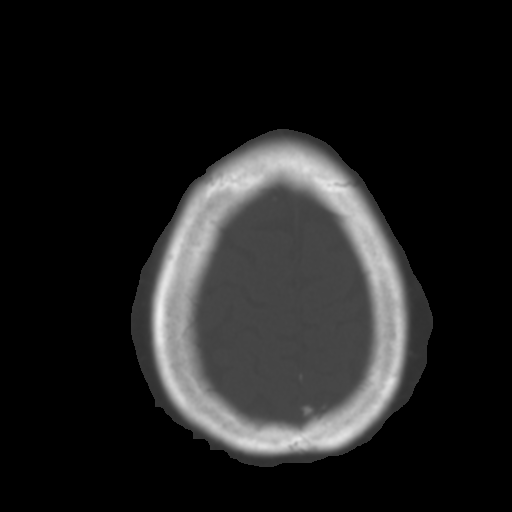
[im 28/31  brain]
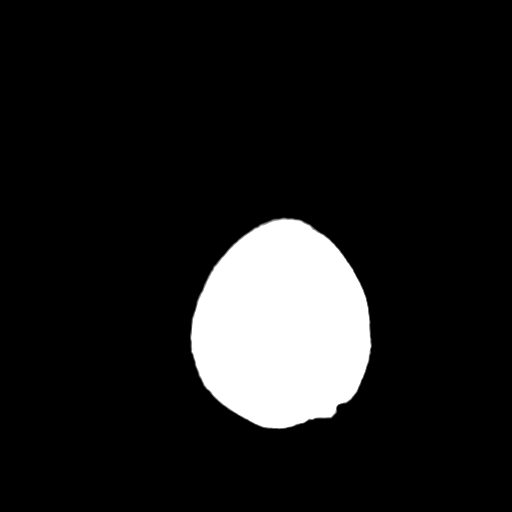

[Series 4: coronal soft · coronal · 0.35mm/px · 3 of 79 slices shown]
[im 27/79  brain]
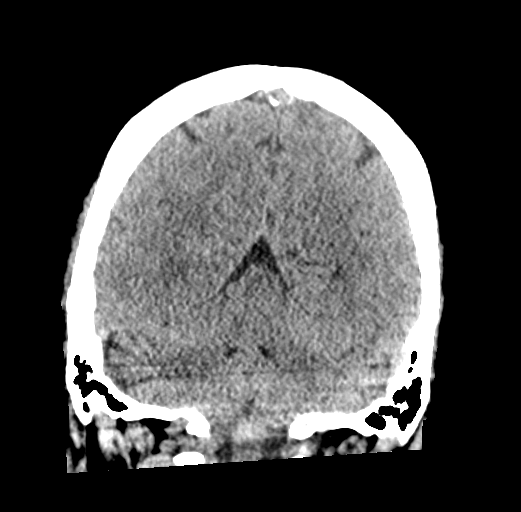
[im 35/79  brain]
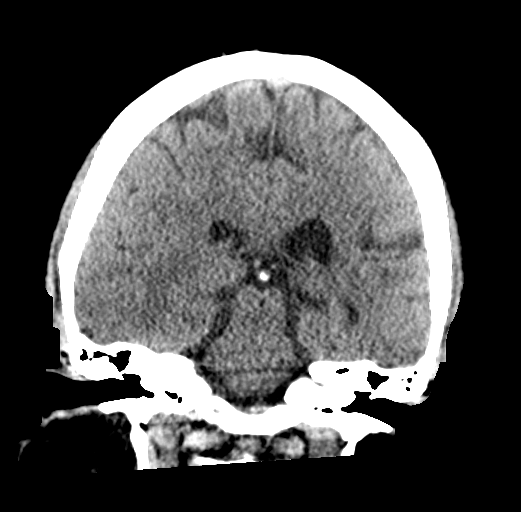
[im 44/79  brain]
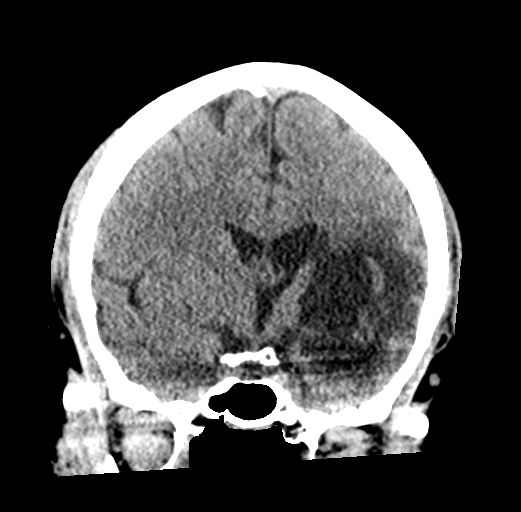

[Series 5: sagittal soft · sagittal · 0.34mm/px · 3 of 59 slices shown]
[im 20/59  brain]
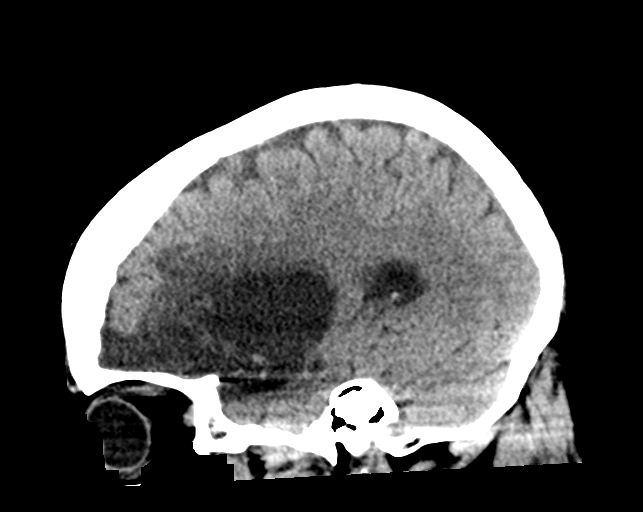
[im 30/59  brain]
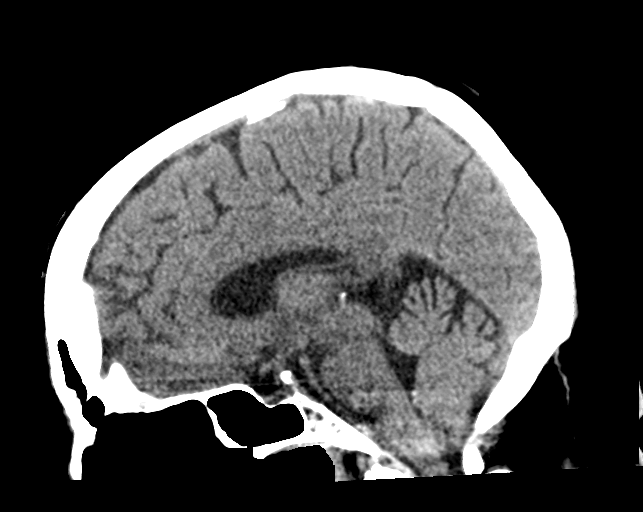
[im 39/59  brain]
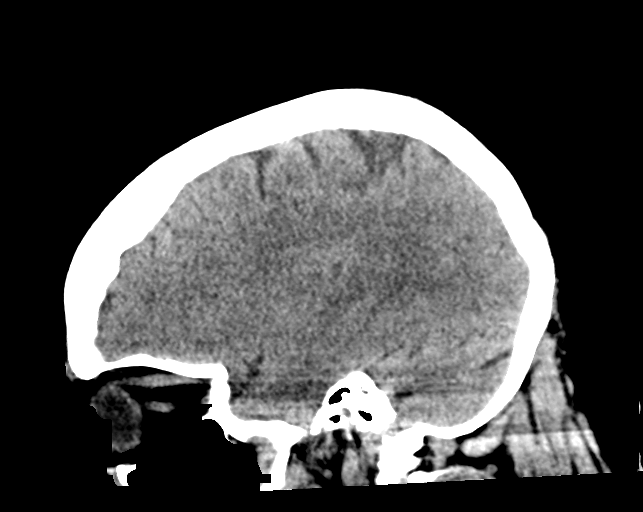

[16 of 47 positions shown; findings below may reference images not displayed]

FINDINGS: Brain: Stable appearance left anterior MCA distribution chronic
infarct with ex vacuo dilatation frontal horn left lateral
ventricle. There is no evidence for acute hemorrhage, hydrocephalus,
mass lesion, or abnormal extra-axial fluid collection. No definite
CT evidence for acute infarction.

Vascular: No hyperdense vessel or unexpected calcification.

Skull: No evidence for fracture. No worrisome lytic or sclerotic
lesion.

Sinuses/Orbits: The visualized paranasal sinuses and mastoid air
cells are clear. Visualized portions of the globes and intraorbital
fat are unremarkable.

Other: None.
IMPRESSION: Stable left anterior MCA distribution chronic infarct. No acute
intracranial abnormality.

## 2021-07-02 MED ORDER — BACLOFEN 5 MG PO TABS: 5.0000 mg | ORAL_TABLET | Freq: Two times a day (BID) | ORAL | 0 refills | Status: DC

## 2021-07-02 MED ORDER — ACETAMINOPHEN 500 MG PO TABS
1000.0000 mg | ORAL_TABLET | Freq: Once | ORAL | Status: AC
  Administered 2021-07-02: 07:00:00 1000 mg via ORAL
  Filled 2021-07-02: qty 2

## 2021-07-02 NOTE — ED Provider Notes (Signed)
[facility] Emergency Department Provider Note MRN:  [id number]  Arrival date & time: 07/02/21     Chief Complaint   Epistaxis   History of Present Illness   Robin [name] is a 55 y.o. year-old female with a history of stroke with residual deficits and aphasia presenting to the ED with chief complaint of epistaxis.  Here for nosebleed, brought by friend.  Mild left-sided facial pain as well.  No other complaints.  Bleeding has stopped.  Review of Systems  A problem-focused ROS was performed. Positive for nosebleed.  Patient denies trauma.  Patient's Health History    Past Medical History:  Diagnosis Date   A-fib [facility])    Abdominal aortic aneurysm (AAA)    Allergy    Anemia    Arthritis    Atrial fibrillation (HCC)    CHF (congestive heart failure) ([place])    a. EF at 30-35% by echo in 08/2018 b. EF at 35% by repeat echo in 05/2019   Chronic abdominal pain    Chronic atrial fibrillation (HCC)    Cocaine abuse ([place])    COPD (chronic obstructive pulmonary disease) ([place])    Diabetes mellitus without complication ([place])    Essential hypertension, benign    Expressive aphasia    Expressive aphasia    post CVA   Fatty liver    GERD (gastroesophageal reflux disease)    Gout 2016   Normal coronary arteries    3/10 - following abnormal Myoview   Ovarian cyst    Stroke ([place]) 12/26/2019   Right sided weakness, and expressive aphasia   Stroke [facility])    Thoracic ascending aortic aneurysm    4 cm 10/31/19 CTA   Type 2 diabetes mellitus ([place])    type II    Past Surgical History:  Procedure Laterality Date   ABDOMINAL HYSTERECTOMY  09/10/2011   Procedure: HYSTERECTOMY ABDOMINAL;  Surgeon: Jonnie Kind, MD;  Location: [facility];  Service: Gynecology;  Laterality: N/A;  Abdominal hysterectomy   CESAREAN SECTION  [phone], and [place]  03/16/2020   IR ANGIO INTRA EXTRACRAN SEL INTERNAL CAROTID BILAT MOD SED   03/16/2020   IR ANGIO VERTEBRAL SEL SUBCLAVIAN INNOMINATE UNI L MOD SED  03/16/2020   IR ANGIO VERTEBRAL SEL VERTEBRAL UNI R MOD SED  03/16/2020   IR CT HEAD LTD  12/27/2019   IR PERCUTANEOUS ART THROMBECTOMY/INFUSION INTRACRANIAL INC DIAG ANGIO  12/27/2019   IR US GUIDE VASC ACCESS RIGHT  12/27/2019   IR US GUIDE VASC ACCESS RIGHT  03/16/2020   RADIOLOGY WITH ANESTHESIA N/A 12/27/2019   Procedure: IR WITH ANESTHESIA;  Surgeon: Luanne Bras, MD;  Location: [facility];  Service: Radiology;  Laterality: N/A;   SCAR REVISION  09/10/2011   Procedure: SCAR REVISION;  Surgeon: Jonnie Kind, MD;  Location: [facility];  Service: Gynecology;  Laterality: N/A;  Wide Excision of old Cicatrix   TUBAL LIGATION  1994    Family History  Problem Relation Age of Onset   Cirrhosis Mother    Early death Mother    Alcohol abuse Mother    Diabetes type II Father    Alcohol abuse Father    Diabetes type II Sister    Early death Brother    Alcohol abuse Son    Anesthesia problems Neg Hx    Hypotension Neg Hx    Malignant hyperthermia Neg Hx    Pseudochol deficiency Neg Hx  Colon cancer Neg Hx    Colon polyps Neg Hx    Esophageal cancer Neg Hx    Rectal cancer Neg Hx    Stomach cancer Neg Hx     Social History   Socioeconomic History   Marital status: Single    Spouse name: Not on file   Number of children: Not on file   Years of education: Not on file   Highest education level: Not on file  Occupational History   Not on file  Tobacco Use   Smoking status: Former    Years: 29.00    Types: Cigarettes    Quit date: 12/2019    Years since quitting: 1.5   Smokeless tobacco: Never  Vaping Use   Vaping Use: Never used  Substance and Sexual Activity   Alcohol use: Not Currently    Comment: occ   Drug use: Not Currently    Types: Cocaine, Marijuana   Sexual activity: Not Currently    Birth control/protection: Surgical  Other Topics Concern   Not on file  Social History Narrative   ** Merged  History Encounter **       Part-time at [employer], lives in [place], [place] 3 children  Married   Social Determinants of Health   Financial Resource Strain: Not on file  Food Insecurity: Not on file  Transportation Needs: Not on file  Physical Activity: Not on file  Stress: Not on file  Social Connections: Not on file  Intimate Partner Violence: Not on file     Physical Exam   Vitals:   07/02/21 0601 07/02/21 0602  BP: (!) 133/94   Pulse:  89  Resp:  18  Temp:  97.7 F (36.5 C)  SpO2:  100%    CONSTITUTIONAL: Chronically ill-appearing, NAD NEURO:  Alert and oriented x 3, no focal deficits EYES:  eyes equal and reactive ENT/NECK:  no LAD, no JVD CARDIO: Regular rate, well-perfused, normal S1 and S2 PULM:  CTAB no wheezing or rhonchi GI/GU:  normal bowel sounds, non-distended, non-tender MSK/SPINE:  No gross deformities, no edema SKIN:  no rash, atraumatic PSYCH:  Appropriate speech and behavior  *Additional and/or pertinent findings included in MDM below  Diagnostic and Interventional Summary    EKG Interpretation  Date/Time:    Ventricular Rate:    PR Interval:    QRS Duration:   QT Interval:    QTC Calculation:   R Axis:     Text Interpretation:         Labs Reviewed - No data to display  No orders to display    Medications  acetaminophen (TYLENOL) tablet 1,000 mg (has no administration in time range)     Procedures  /  Critical Care Procedures  ED Course and Medical Decision Making  I have reviewed the triage vital signs, the nursing notes, and pertinent available records from the EMR.  Listed above are laboratory and imaging tests that I personally ordered, reviewed, and interpreted and then considered in my medical decision making (see below for details).  No bleeding at this time, no significant tenderness to the face, no signs of trauma, no other complaints, normal vital signs, anticipating discharge so long as bleeding does not  return.       Barth Kirks. Sedonia Small, [place] [email or web address]  Final Clinical Impressions(s) / ED Diagnoses     ICD-10-CM   1. Epistaxis  R04.0       ED Discharge  Orders     None        Discharge Instructions Discussed with and Provided to Patient:   Discharge Instructions   None       Maudie Flakes, MD 07/02/21 0630

## 2021-07-02 NOTE — Discharge Instructions (Addendum)
I also included a prescription for a low-dose of a muscle relaxer.  Take it as needed to see if it helps with the right-sided pain/spasms.  Follow-up with your doctor.

## 2021-07-02 NOTE — ED Triage Notes (Signed)
Pt brought in by friend for nose bleed. No bleeding at this time. Pt c/o facial pain.

## 2021-07-02 NOTE — ED Provider Notes (Signed)
  Physical Exam  BP (!) 146/110   Pulse 100   Temp 97.7 F (36.5 C) (Temporal)   Resp 18   Ht 5\' 2"  (1.575 m)   Wt 107 kg   LMP 08/21/2011   SpO2 99%   BMI 43.16 kg/m   Physical Exam  ED Course/Procedures     Procedures  MDM  Received patient in signout.  Had nosebleed that is resolved.  Later complaining of some right-sided pain.  Muscular pain in arm and leg.  Reviewing records has had this before.  Has had a ABI within the last couple weeks.  Lab work within the last couple weeks was also reassuring.  On anticoagulation already for A. fib.  Doubt acute abnormality appears that this is more constant.  Electrolytes recently reassuring.  With the stroke with right-sided deficits may have some muscle spasms.  We will add a small dose of a baclofen to use as needed.  Has been on a muscle relaxer before but unable to determine what that was at this time.  We will have patient follow-up PCP as needed       Davonna Belling, MD 07/02/21 1004

## 2021-07-11 ENCOUNTER — Encounter (HOSPITAL_COMMUNITY): Payer: Self-pay

## 2021-07-11 ENCOUNTER — Other Ambulatory Visit: Payer: Self-pay

## 2021-07-11 ENCOUNTER — Emergency Department (HOSPITAL_COMMUNITY)
Admission: EM | Admit: 2021-07-11 | Discharge: 2021-07-11 | Disposition: A | Discharge status: Home / Self Care | Payer: Medicaid Other | Attending: Emergency Medicine | Admitting: Emergency Medicine

## 2021-07-11 DIAGNOSIS — E1122 Type 2 diabetes mellitus with diabetic chronic kidney disease: Secondary | ICD-10-CM | POA: Insufficient documentation

## 2021-07-11 DIAGNOSIS — Z87891 Personal history of nicotine dependence: Secondary | ICD-10-CM | POA: Diagnosis not present

## 2021-07-11 DIAGNOSIS — I5032 Chronic diastolic (congestive) heart failure: Secondary | ICD-10-CM | POA: Insufficient documentation

## 2021-07-11 DIAGNOSIS — Z79899 Other long term (current) drug therapy: Secondary | ICD-10-CM | POA: Insufficient documentation

## 2021-07-11 DIAGNOSIS — Z7982 Long term (current) use of aspirin: Secondary | ICD-10-CM | POA: Diagnosis not present

## 2021-07-11 DIAGNOSIS — Z794 Long term (current) use of insulin: Secondary | ICD-10-CM | POA: Diagnosis not present

## 2021-07-11 DIAGNOSIS — K2900 Acute gastritis without bleeding: Secondary | ICD-10-CM | POA: Diagnosis not present

## 2021-07-11 DIAGNOSIS — J449 Chronic obstructive pulmonary disease, unspecified: Secondary | ICD-10-CM | POA: Diagnosis not present

## 2021-07-11 DIAGNOSIS — R040 Epistaxis: Secondary | ICD-10-CM | POA: Insufficient documentation

## 2021-07-11 DIAGNOSIS — I130 Hypertensive heart and chronic kidney disease with heart failure and stage 1 through stage 4 chronic kidney disease, or unspecified chronic kidney disease: Secondary | ICD-10-CM | POA: Insufficient documentation

## 2021-07-11 DIAGNOSIS — K219 Gastro-esophageal reflux disease without esophagitis: Secondary | ICD-10-CM | POA: Insufficient documentation

## 2021-07-11 DIAGNOSIS — Z7901 Long term (current) use of anticoagulants: Secondary | ICD-10-CM | POA: Insufficient documentation

## 2021-07-11 DIAGNOSIS — R109 Unspecified abdominal pain: Secondary | ICD-10-CM | POA: Diagnosis present

## 2021-07-11 DIAGNOSIS — A599 Trichomoniasis, unspecified: Secondary | ICD-10-CM | POA: Diagnosis not present

## 2021-07-11 DIAGNOSIS — I4891 Unspecified atrial fibrillation: Secondary | ICD-10-CM | POA: Insufficient documentation

## 2021-07-11 DIAGNOSIS — N1831 Chronic kidney disease, stage 3a: Secondary | ICD-10-CM | POA: Insufficient documentation

## 2021-07-11 LAB — URINALYSIS, MICROSCOPIC (REFLEX): RBC / HPF: NONE SEEN RBC/hpf (ref 0–5)

## 2021-07-11 LAB — CBC WITH DIFFERENTIAL/PLATELET
Abs Immature Granulocytes: 0.02 10*3/uL (ref 0.00–0.07)
Basophils Absolute: 0.1 10*3/uL (ref 0.0–0.1)
Basophils Relative: 1 %
Eosinophils Absolute: 0.2 10*3/uL (ref 0.0–0.5)
Eosinophils Relative: 2 %
HCT: 35.4 % — ABNORMAL LOW (ref 36.0–46.0)
Hemoglobin: 10.7 g/dL — ABNORMAL LOW (ref 12.0–15.0)
Immature Granulocytes: 0 %
Lymphocytes Relative: 35 %
Lymphs Abs: 2.7 10*3/uL (ref 0.7–4.0)
MCH: 24.2 pg — ABNORMAL LOW (ref 26.0–34.0)
MCHC: 30.2 g/dL (ref 30.0–36.0)
MCV: 79.9 fL — ABNORMAL LOW (ref 80.0–100.0)
Monocytes Absolute: 0.7 10*3/uL (ref 0.1–1.0)
Monocytes Relative: 9 %
Neutro Abs: 4.1 10*3/uL (ref 1.7–7.7)
Neutrophils Relative %: 53 %
Platelets: 287 10*3/uL (ref 150–400)
RBC: 4.43 MIL/uL (ref 3.87–5.11)
RDW: 14.8 % (ref 11.5–15.5)
WBC: 7.7 10*3/uL (ref 4.0–10.5)
nRBC: 0 % (ref 0.0–0.2)

## 2021-07-11 LAB — COMPREHENSIVE METABOLIC PANEL
ALT: 15 U/L (ref 0–44)
AST: 19 U/L (ref 15–41)
Albumin: 3.7 g/dL (ref 3.5–5.0)
Alkaline Phosphatase: 98 U/L (ref 38–126)
Anion gap: 8 (ref 5–15)
BUN: 24 mg/dL — ABNORMAL HIGH (ref 6–20)
CO2: 24 mmol/L (ref 22–32)
Calcium: 9 mg/dL (ref 8.9–10.3)
Chloride: 102 mmol/L (ref 98–111)
Creatinine, Ser: 1.78 mg/dL — ABNORMAL HIGH (ref 0.44–1.00)
GFR, Estimated: 34 mL/min — ABNORMAL LOW (ref >60)
Glucose, Bld: 207 mg/dL — ABNORMAL HIGH (ref 70–99)
Potassium: 3.6 mmol/L (ref 3.5–5.1)
Sodium: 134 mmol/L — ABNORMAL LOW (ref 135–145)
Total Bilirubin: 0.6 mg/dL (ref 0.3–1.2)
Total Protein: 7.4 g/dL (ref 6.5–8.1)

## 2021-07-11 LAB — URINALYSIS, ROUTINE W REFLEX MICROSCOPIC
Bilirubin Urine: NEGATIVE
Glucose, UA: 100 mg/dL — AB
Hgb urine dipstick: NEGATIVE
Ketones, ur: NEGATIVE mg/dL
Nitrite: NEGATIVE
Protein, ur: NEGATIVE mg/dL
Specific Gravity, Urine: 1.02 (ref 1.005–1.030)
pH: 5 (ref 5.0–8.0)

## 2021-07-11 LAB — LIPASE, BLOOD: Lipase: 37 U/L (ref 11–51)

## 2021-07-11 MED ORDER — METRONIDAZOLE 500 MG PO TABS: 500.0000 mg | ORAL_TABLET | Freq: Two times a day (BID) | ORAL | 0 refills | Status: DC

## 2021-07-11 MED ORDER — PANTOPRAZOLE SODIUM 20 MG PO TBEC: 20.0000 mg | DELAYED_RELEASE_TABLET | Freq: Every day | ORAL | 0 refills | Status: DC

## 2021-07-11 MED ORDER — METOCLOPRAMIDE HCL 10 MG PO TABS: 10.0000 mg | ORAL_TABLET | Freq: Four times a day (QID) | ORAL | 0 refills | Status: DC | PRN

## 2021-07-11 MED ORDER — PANTOPRAZOLE SODIUM 40 MG IV SOLR
40.0000 mg | Freq: Once | INTRAVENOUS | Status: AC
  Administered 2021-07-11: 40 mg via INTRAVENOUS
  Filled 2021-07-11: qty 40

## 2021-07-11 MED ORDER — METOCLOPRAMIDE HCL 5 MG/ML IJ SOLN
10.0000 mg | Freq: Once | INTRAMUSCULAR | Status: AC
  Administered 2021-07-11: 10 mg via INTRAVENOUS
  Filled 2021-07-11: qty 2

## 2021-07-11 NOTE — Discharge Instructions (Addendum)
As discussed applying a gentle coating of Vaseline to the interior of your nose using a cotton swab before bedtime can help to prevent the lining of your nose from becoming dry which can make you more susceptible to nosebleeds.  Do not insert the swab more than to the base of the cotton.  Avoid vigorous nose blowing which can stir up bleeding.  Tilt your head forward and hold your nose very steady for 15 minutes if it starts to bleed again.  If it has not stopped after 15 minutes return here for further treatment of this problem.  You do have trichomonas in your urine today, which is considered an STD.  You have been prescribed flagyl to take for the next 7 days.  You will want to make sure your partner has been treated for this condition also before resuming sex.

## 2021-07-11 NOTE — ED Triage Notes (Signed)
Pt brought in by [facility]. States she has a nose bleed yesterday and has been having weakness since then.  Has a hx of stroke with speech deficits.

## 2021-07-11 NOTE — ED Provider Notes (Signed)
[facility] EMERGENCY DEPARTMENT Provider Note   CSN: [id number] Arrival date & time: 07/11/21  1229     History Chief Complaint  Patient presents with   Weakness    Robin [name] is a 55 y.o. female with a history including atrial fibrillation, CHF, COPD, type 2 diabetes, hypertension, expressive aphasia secondary to CVA (right-sided weakness but is ambulatory with a cane per sister at bedside).  Also history of GERD, presenting for evaluation of nosebleed and abdominal pain.  She had a nosebleed yesterday which apparently occurred spontaneously and has been intermittently bleeding throughout the course of the night but is currently resolved.  She also has complaints of pain in her abdomen, sister at bedside states she has had reduced p.o. intake today but no documented vomiting, also no diarrhea or noted fevers.  Patient is a poor history giver due to aphasia.  The history is provided by the patient.      Past Medical History:  Diagnosis Date   A-fib [facility])    Abdominal aortic aneurysm (AAA)    Allergy    Anemia    Arthritis    Atrial fibrillation (HCC)    CHF (congestive heart failure) ([place])    a. EF at 30-35% by echo in 08/2018 b. EF at 35% by repeat echo in 05/2019   Chronic abdominal pain    Chronic atrial fibrillation (HCC)    Cocaine abuse ([place])    COPD (chronic obstructive pulmonary disease) ([place])    Diabetes mellitus without complication ([place])    Essential hypertension, benign    Expressive aphasia    Expressive aphasia    post CVA   Fatty liver    GERD (gastroesophageal reflux disease)    Gout 2016   Normal coronary arteries    3/10 - following abnormal Myoview   Ovarian cyst    Stroke ([place]) 12/26/2019   Right sided weakness, and expressive aphasia   Stroke [facility])    Thoracic ascending aortic aneurysm    4 cm 10/31/19 CTA   Type 2 diabetes mellitus ([place])    type II    Patient Active Problem List   Diagnosis Date Noted   Diabetes mellitus ([place])  03/09/2021   Type 2 diabetes mellitus with stage 3a chronic kidney disease, with long-term current use of insulin ([place]) 22/97/9892   Acute metabolic encephalopathy 11/94/1740   Hyperglycemia 12/31/2020   Prolonged QT interval 05/12/2020   Atrial fibrillation, chronic (HCC) 05/12/2020   Right hemiparesis ([place])    Essential hypertension    Chronic diastolic congestive heart failure ([place])    Dysphagia due to recent stroke 01/05/2020   Aneurysm of right carotid artery ([place]) paraclinoid 81/44/8185   Embolic cerebral infarction [facility]) s/p clot retrieval 12/27/2019   Thoracic aortic aneurysm without rupture    Type 2 diabetes mellitus with diabetic autonomic neuropathy, with long-term current use of insulin ([place]) 09/01/2018   Cocaine abuse ([place]) 08/25/2018   Cigarette nicotine dependence, uncomplicated 63/14/9702   GERD (gastroesophageal reflux disease) 06/07/2011   COPD (chronic obstructive pulmonary disease) ([place]) 06/07/2011   Arthritis 06/07/2011    Past Surgical History:  Procedure Laterality Date   ABDOMINAL HYSTERECTOMY  09/10/2011   Procedure: HYSTERECTOMY ABDOMINAL;  Surgeon: Jonnie Kind, MD;  Location: [facility];  Service: Gynecology;  Laterality: N/A;  Abdominal hysterectomy   CESAREAN SECTION  [id number], and [place]  03/16/2020   IR ANGIO INTRA EXTRACRAN SEL INTERNAL CAROTID BILAT MOD  SED  03/16/2020   IR ANGIO VERTEBRAL SEL SUBCLAVIAN INNOMINATE UNI L MOD SED  03/16/2020   IR ANGIO VERTEBRAL SEL VERTEBRAL UNI R MOD SED  03/16/2020   IR CT HEAD LTD  12/27/2019   IR PERCUTANEOUS ART THROMBECTOMY/INFUSION INTRACRANIAL INC DIAG ANGIO  12/27/2019   IR US GUIDE VASC ACCESS RIGHT  12/27/2019   IR US GUIDE VASC ACCESS RIGHT  03/16/2020   RADIOLOGY WITH ANESTHESIA N/A 12/27/2019   Procedure: IR WITH ANESTHESIA;  Surgeon: Luanne Bras, MD;  Location: [place];  Service: Radiology;  Laterality: N/A;   SCAR REVISION  09/10/2011   Procedure: SCAR  REVISION;  Surgeon: Jonnie Kind, MD;  Location: [facility];  Service: Gynecology;  Laterality: N/A;  Wide Excision of old Cicatrix   TUBAL LIGATION  1994     OB History     Gravida  3   Para  0   Term  0   Preterm  0   AB  0   Living         SAB  0   IAB  0   Ectopic  0   Multiple      Live Births              Family History  Problem Relation Age of Onset   Cirrhosis Mother    Early death Mother    Alcohol abuse Mother    Diabetes type II Father    Alcohol abuse Father    Diabetes type II Sister    Early death Brother    Alcohol abuse Son    Anesthesia problems Neg Hx    Hypotension Neg Hx    Malignant hyperthermia Neg Hx    Pseudochol deficiency Neg Hx    Colon cancer Neg Hx    Colon polyps Neg Hx    Esophageal cancer Neg Hx    Rectal cancer Neg Hx    Stomach cancer Neg Hx     Social History   Tobacco Use   Smoking status: Former    Years: 29.00    Types: Cigarettes    Quit date: 12/2019    Years since quitting: 1.6   Smokeless tobacco: Never  Vaping Use   Vaping Use: Never used  Substance Use Topics   Alcohol use: Not Currently    Comment: occ   Drug use: Not Currently    Types: Cocaine, Marijuana    Home Medications Prior to Admission medications   Medication Sig Start Date End Date Taking? Authorizing Provider  apixaban (ELIQUIS) 5 MG TABS tablet Take 1 tablet (5 mg total) by mouth 2 (two) times daily. 03/13/21  Yes Inda Coke, PA  aspirin EC 81 MG tablet Take 1 tablet (81 mg total) by mouth daily. Swallow whole. 07/19/20  Yes Strader, [place], PA-C  carvedilol (COREG) 25 MG tablet TAKE 1 TABLET BY MOUTH TWICE DAILY WITH A MEAL Patient taking differently: Take 25 mg by mouth 2 (two) times daily with a meal. 06/15/21  Yes Inda Coke, PA  Dulaglutide (TRULICITY) 2.24 MG/5.0IB SOPN Inject 0.75 mg into the skin once a week. 06/13/21  Yes Shamleffer, Melanie Crazier, MD  furosemide (LASIX) 20 MG tablet Take 1 tablet by mouth  once daily 06/15/21  Yes Morene Rankins, [place], PA  gabapentin (NEURONTIN) 100 MG capsule Take 1 capsule (100 mg total) by mouth 3 (three) times daily. Patient taking differently: Take 100-200 mg by mouth See admin instructions. 2 in the morning, 1 in the afternoon,  and 2 at night 03/13/21  Yes Worley, [place], [place]  hydrALAZINE (APRESOLINE) 25 MG tablet TAKE 1 TABLET BY MOUTH THREE TIMES DAILY Patient taking differently: Take 25 mg by mouth 3 (three) times daily. 06/15/21  Yes Worley, Lelon Mast, PA  insulin aspart (NOVOLOG FLEXPEN) 100 UNIT/ML FlexPen Inject 12 Units into the skin 3 (three) times daily with meals. 06/13/21  Yes Shamleffer, Konrad Dolores, MD  insulin glargine (LANTUS SOLOSTAR) 100 UNIT/ML Solostar Pen Inject 30 Units into the skin daily. 06/13/21  Yes Shamleffer, Konrad Dolores, MD  isosorbide mononitrate (IMDUR) 30 MG 24 hr tablet Take 2 tablets by mouth once daily 06/15/21  Yes Jarold Motto, PA  metoCLOPramide (REGLAN) 10 MG tablet Take 1 tablet (10 mg total) by mouth every 6 (six) hours as needed for nausea. 07/11/21  Yes Johny Pitstick, Raynelle Fanning, PA-C  metroNIDAZOLE (FLAGYL) 500 MG tablet Take 1 tablet (500 mg total) by mouth 2 (two) times daily. 07/11/21  Yes Trudy Kory, Raynelle Fanning, PA-C  pantoprazole (PROTONIX) 20 MG tablet Take 1 tablet (20 mg total) by mouth daily. 07/11/21  Yes IdolRaynelle Fanning, PA-C  Accu-Chek Softclix Lancets lancets Use as instructed 06/13/21   Shamleffer, Konrad Dolores, MD  baclofen 5 MG TABS Take 5 mg by mouth 2 (two) times daily. Patient not taking: Reported on 07/11/2021 07/02/21   Benjiman Core, MD  BD PEN NEEDLE NANO U/F 32G X 4 MM MISC Inject 1 Device into the skin in the morning, at noon, in the evening, and at bedtime. 06/13/21   Shamleffer, Konrad Dolores, MD  Blood Glucose Monitoring Suppl (ACCU-CHEK GUIDE ME) w/Device KIT 1 Device by Does not apply route 3 (three) times daily. 03/09/21   Shamleffer, Konrad Dolores, MD  Continuous Blood Gluc Receiver (DEXCOM G6 RECEIVER) DEVI  1 Device by Does not apply route as directed. 06/13/21   Shamleffer, Konrad Dolores, MD  Continuous Blood Gluc Sensor (DEXCOM G6 SENSOR) MISC 1 Device by Does not apply route as directed. 06/13/21   Shamleffer, Konrad Dolores, MD  Continuous Blood Gluc Transmit (DEXCOM G6 TRANSMITTER) MISC 1 Device by Does not apply route as directed. 06/13/21   Shamleffer, Konrad Dolores, MD  glucose blood (ACCU-CHEK GUIDE) test strip 1 each by Other route 3 (three) times daily. Use as instructed 06/13/21   Shamleffer, Konrad Dolores, MD  nitroGLYCERIN (NITROSTAT) 0.4 MG SL tablet Place 1 tablet (0.4 mg total) under the tongue every 5 (five) minutes as needed for chest pain. 02/01/20   Angiulli, Mcarthur Rossetti, PA-C    Allergies    Bee venom, Losartan, Naproxen, Penicillins, and Lisinopril  Review of Systems   Review of Systems  Unable to perform ROS: Patient nonverbal  Constitutional:  Positive for appetite change.  HENT:  Positive for nosebleeds.   Gastrointestinal:  Positive for abdominal pain.   Physical Exam Updated Vital Signs BP 120/80   Pulse 94   Temp 98 F (36.7 C) (Oral)   Resp 20   Ht 5\' 6"  (1.676 m)   Wt 102.1 kg   LMP 08/21/2011   SpO2 99%   BMI 36.32 kg/m   Physical Exam Vitals and nursing note reviewed.  Constitutional:      Appearance: She is well-developed.  HENT:     Head: Normocephalic and atraumatic.     Nose:     Left Nostril: No epistaxis.     Comments: No active epistaxis, both septal mucosa are hyperemic but no obvious source of bleeding noted. Eyes:     Conjunctiva/sclera: Conjunctivae normal.  Cardiovascular:  Rate and Rhythm: Normal rate and regular rhythm.     Heart sounds: Normal heart sounds.  Pulmonary:     Effort: Pulmonary effort is normal.     Breath sounds: Normal breath sounds. No wheezing.  Abdominal:     General: Bowel sounds are normal.     Palpations: Abdomen is soft.     Tenderness: There is abdominal tenderness in the epigastric area.   Musculoskeletal:        General: Normal range of motion.     Cervical back: Normal range of motion.  Skin:    General: Skin is warm and dry.  Neurological:     Mental Status: She is alert.    ED Results / Procedures / Treatments   Labs (all labs ordered are listed, but only abnormal results are displayed) Labs Reviewed  CBC WITH DIFFERENTIAL/PLATELET - Abnormal; Notable for the following components:      Result Value   Hemoglobin 10.7 (*)    HCT 35.4 (*)    MCV 79.9 (*)    MCH 24.2 (*)    All other components within normal limits  COMPREHENSIVE METABOLIC PANEL - Abnormal; Notable for the following components:   Sodium 134 (*)    Glucose, Bld 207 (*)    BUN 24 (*)    Creatinine, Ser 1.78 (*)    GFR, Estimated 34 (*)    All other components within normal limits  URINALYSIS, ROUTINE W REFLEX MICROSCOPIC - Abnormal; Notable for the following components:   Glucose, UA 100 (*)    Leukocytes,Ua TRACE (*)    All other components within normal limits  URINALYSIS, MICROSCOPIC (REFLEX) - Abnormal; Notable for the following components:   Bacteria, UA RARE (*)    Trichomonas, UA PRESENT (*)    All other components within normal limits  LIPASE, BLOOD    EKG None  Radiology No results found.  Procedures Procedures   Medications Ordered in ED Medications  metoCLOPramide (REGLAN) injection 10 mg (10 mg Intravenous Given 07/11/21 1436)  pantoprazole (PROTONIX) injection 40 mg (40 mg Intravenous Given 07/11/21 1435)    ED Course  I have reviewed the triage vital signs and the nursing notes.  Pertinent labs & imaging results that were available during my care of the patient were reviewed by me and considered in my medical decision making (see chart for details).    MDM Rules/Calculators/A&P                           Labs reviewed and discussed with patient and her sister at the bedside.  She has had no epistaxis while here and there is no obvious visible source of nosebleed  on her exam.  She did have complaints of epigastric discomfort, she was given IV Zofran and Protonix and the symptom completely resolved.  I suspect she may have had some gastric irritation possibly secondary to swallowing blood from the nosebleed.  We discussed home treatment in event epistaxis returns.  She denies any vaginal symptoms, but she does have trichomonas in her urine, she was also given a prescription for Flagyl.  States she is sexually active - advised she will need to advise partner for treatment and to avoid sex until both have completed their abx.  Advised follow-up with her PCP as needed for any persistent or worsening symptoms.  Sister at the bedside was desirous of an ENT referral as she has had nosebleeds in the past and would  like definitive treatment, she was given referral to [facility] for follow-up care in the event this continues to be a chronic ongoing problem for her.  Serial exams during the visit, abdominal exam is benign, after receiving the Zofran and the Protonix she had no further complaints of epigastric discomfort.  No guarding on repeat exam.  Benign abdomen.  Return precautions outlined. The patient appears reasonably screened and/or stabilized for discharge and I doubt any other medical condition or other [facility] requiring further screening, evaluation, or treatment in the ED at this time prior to discharge.     Final Clinical Impression(s) / ED Diagnoses Final diagnoses:  Epistaxis  Other acute gastritis, presence of bleeding unspecified  Trichomonas infection    Rx / DC Orders ED Discharge Orders          Ordered    metroNIDAZOLE (FLAGYL) 500 MG tablet  2 times daily        07/11/21 1704    metoCLOPramide (REGLAN) 10 MG tablet  Every 6 hours PRN        07/11/21 1708    pantoprazole (PROTONIX) 20 MG tablet  Daily        07/11/21 1708             Evalee Jefferson, Hershal Coria 07/12/21 0017    Luna Fuse, MD 07/13/21 1626

## 2021-07-27 ENCOUNTER — Ambulatory Visit (INDEPENDENT_AMBULATORY_CARE_PROVIDER_SITE_OTHER): Payer: Medicaid Other | Admitting: Student

## 2021-07-27 ENCOUNTER — Other Ambulatory Visit: Payer: Self-pay

## 2021-07-27 ENCOUNTER — Encounter: Payer: Self-pay | Admitting: Student

## 2021-07-27 VITALS — BP 116/72 | HR 92 | Ht 67.0 in | Wt 236.0 lb

## 2021-07-27 DIAGNOSIS — E785 Hyperlipidemia, unspecified: Secondary | ICD-10-CM

## 2021-07-27 DIAGNOSIS — I10 Essential (primary) hypertension: Secondary | ICD-10-CM

## 2021-07-27 DIAGNOSIS — Z79899 Other long term (current) drug therapy: Secondary | ICD-10-CM

## 2021-07-27 DIAGNOSIS — I350 Nonrheumatic aortic (valve) stenosis: Secondary | ICD-10-CM

## 2021-07-27 DIAGNOSIS — I5042 Chronic combined systolic (congestive) and diastolic (congestive) heart failure: Secondary | ICD-10-CM

## 2021-07-27 DIAGNOSIS — I4821 Permanent atrial fibrillation: Secondary | ICD-10-CM

## 2021-07-27 MED ORDER — FUROSEMIDE 40 MG PO TABS: 40.0000 mg | ORAL_TABLET | Freq: Every day | ORAL | 3 refills | Status: DC

## 2021-07-27 NOTE — Progress Notes (Addendum)
Cardiology Office Note    Date:  07/27/2021   ID:  Robin [name], DOB 23-Aug-1965, MRN [id number]  PCP:  Jarold Motto, PA  Cardiologist: Dina Rich, MD    Chief Complaint  Patient presents with   Follow-up    Overdue Visit    History of Present Illness:    Robin [name] is a 55 y.o. female with past medical history of HFimpEF (EF at 30 to 35% by echo in 08/2018 with similar results by echo in 05/2019, EF improved to 65-70% by echo in 04/2020, at 50-55% by echo in 07/2020), persistent atrial fibrillation (diagnosed in 04/2019), HTN, IDDM, COPD, thoracic aortic aneurysm, normal coronaries by catheterization in 2010, prior CVA and history of substance abuse who presents to the office today for overdue follow-up.  She was examined myself in 07/2020 for hospital follow-up from a recent CHF exacerbation and reported her breathing had overall been stable and denied any exertional chest pain or palpitations. She was previously being considered for aneurysm treatment had been on ASA and Brilinta but there were no plans for further treatment at that time, therefore Brilinta was discontinued after discussion with IR and she was restarted on Eliquis 5 mg twice daily due to her atrial fibrillation. Was informed to follow-up in 3 to 4 months but has not been evaluated by Cardiology since.  She was admitted [facility] in [facility]/2020 for acute metabolic encephalopathy in the setting of a UTI and uncontrolled hyperglycemia with return to baseline prior to discharge. She did have 2 ER visits within the past month for recurrent epistaxis and outpatient referral to ENT was recommended.  In talking with the patient and her sister today, she reports having intermittent dyspnea for the past few months and it sounds like symptoms typically occur with activity but her history is limited due to her prior CVA.  She reports occasional discomfort in her chest as well which typically occurs at rest and  spontaneously resolves. No reported orthopnea, PND or pitting edema. By review of her weight, this has been stable within the past month but she has gained over 30 pounds within the past year.  Past Medical History:  Diagnosis Date   A-fib [facility])    Abdominal aortic aneurysm (AAA)    Allergy    Anemia    Arthritis    Atrial fibrillation (HCC)    CHF (congestive heart failure) (HCC)    a. EF at 30-35% by echo in 08/2018 b. EF at 35% by repeat echo in 05/2019   Chronic abdominal pain    Chronic atrial fibrillation (HCC)    Cocaine abuse (HCC)    COPD (chronic obstructive pulmonary disease) (HCC)    Diabetes mellitus without complication (HCC)    Essential hypertension, benign    Expressive aphasia    Expressive aphasia    post CVA   Fatty liver    GERD (gastroesophageal reflux disease)    Gout 2016   Normal coronary arteries    3/10 - following abnormal Myoview   Ovarian cyst    Stroke (HCC) 12/26/2019   Right sided weakness, and expressive aphasia   Stroke [facility])    Thoracic ascending aortic aneurysm    4 cm 10/31/19 CTA   Type 2 diabetes mellitus (HCC)    type II    Past Surgical History:  Procedure Laterality Date   ABDOMINAL HYSTERECTOMY  09/10/2011   Procedure: HYSTERECTOMY ABDOMINAL;  Surgeon: Tilda Burrow, MD;  Location: [facility];  Service: Gynecology;  Laterality: N/A;  Abdominal hysterectomy   CESAREAN SECTION  [phone], and [place]  03/16/2020   IR ANGIO INTRA EXTRACRAN SEL INTERNAL CAROTID BILAT MOD SED  03/16/2020   IR ANGIO VERTEBRAL SEL SUBCLAVIAN INNOMINATE UNI L MOD SED  03/16/2020   IR ANGIO VERTEBRAL SEL VERTEBRAL UNI R MOD SED  03/16/2020   IR CT HEAD LTD  12/27/2019   IR PERCUTANEOUS ART THROMBECTOMY/INFUSION INTRACRANIAL INC DIAG ANGIO  12/27/2019   IR US GUIDE VASC ACCESS RIGHT  12/27/2019   IR US GUIDE VASC ACCESS RIGHT  03/16/2020   RADIOLOGY WITH ANESTHESIA N/A 12/27/2019   Procedure: IR WITH ANESTHESIA;   Surgeon: Luanne Bras, MD;  Location: [place];  Service: Radiology;  Laterality: N/A;   SCAR REVISION  09/10/2011   Procedure: SCAR REVISION;  Surgeon: Jonnie Kind, MD;  Location: [facility];  Service: Gynecology;  Laterality: N/A;  Wide Excision of old Cicatrix   TUBAL LIGATION  1994    Current Medications: Outpatient Medications Prior to Visit  Medication Sig Dispense Refill   Accu-Chek Softclix Lancets lancets Use as instructed 100 each 12   apixaban (ELIQUIS) 5 MG TABS tablet Take 1 tablet (5 mg total) by mouth 2 (two) times daily. 180 tablet 0   aspirin EC 81 MG tablet Take 1 tablet (81 mg total) by mouth daily. Swallow whole. 90 tablet 3   baclofen 5 MG TABS Take 5 mg by mouth 2 (two) times daily. 10 tablet 0   BD PEN NEEDLE NANO U/F 32G X 4 MM MISC Inject 1 Device into the skin in the morning, at noon, in the evening, and at bedtime. 400 each 3   Blood Glucose Monitoring Suppl (ACCU-CHEK GUIDE ME) w/Device KIT 1 Device by Does not apply route 3 (three) times daily. 1 kit 0   carvedilol (COREG) 25 MG tablet TAKE 1 TABLET BY MOUTH TWICE DAILY WITH A MEAL (Patient taking differently: Take 25 mg by mouth 2 (two) times daily with a meal.) 180 tablet 0   Continuous Blood Gluc Receiver (DEXCOM G6 RECEIVER) DEVI 1 Device by Does not apply route as directed. 1 each 0   Continuous Blood Gluc Sensor (DEXCOM G6 SENSOR) MISC 1 Device by Does not apply route as directed. 9 each 3   Continuous Blood Gluc Transmit (DEXCOM G6 TRANSMITTER) MISC 1 Device by Does not apply route as directed. 1 each 3   Dulaglutide (TRULICITY) 9.83 JA/2.5KN SOPN Inject 0.75 mg into the skin once a week. 6 mL 3   gabapentin (NEURONTIN) 100 MG capsule Take 1 capsule (100 mg total) by mouth 3 (three) times daily. (Patient taking differently: Take 100-200 mg by mouth See admin instructions. 2 in the morning, 1 in the afternoon, and 2 at night) 270 capsule 3   glucose blood (ACCU-CHEK GUIDE) test strip 1 each by Other route 3  (three) times daily. Use as instructed 300 each 3   hydrALAZINE (APRESOLINE) 25 MG tablet TAKE 1 TABLET BY MOUTH THREE TIMES DAILY (Patient taking differently: Take 25 mg by mouth 3 (three) times daily.) 270 tablet 0   insulin aspart (NOVOLOG FLEXPEN) 100 UNIT/ML FlexPen Inject 12 Units into the skin 3 (three) times daily with meals. 30 mL 3   insulin glargine (LANTUS SOLOSTAR) 100 UNIT/ML Solostar Pen Inject 30 Units into the skin daily. 30 mL 3   isosorbide mononitrate (IMDUR) 30 MG 24 hr tablet Take 2 tablets  by mouth once daily 180 tablet 0   metoCLOPramide (REGLAN) 10 MG tablet Take 1 tablet (10 mg total) by mouth every 6 (six) hours as needed for nausea. 15 tablet 0   metroNIDAZOLE (FLAGYL) 500 MG tablet Take 1 tablet (500 mg total) by mouth 2 (two) times daily. 14 tablet 0   nitroGLYCERIN (NITROSTAT) 0.4 MG SL tablet Place 1 tablet (0.4 mg total) under the tongue every 5 (five) minutes as needed for chest pain. 30 tablet 12   pantoprazole (PROTONIX) 20 MG tablet Take 1 tablet (20 mg total) by mouth daily. 15 tablet 0   furosemide (LASIX) 20 MG tablet Take 1 tablet by mouth once daily 90 tablet 0   No facility-administered medications prior to visit.     Allergies:   Bee venom, Losartan, Naproxen, Penicillins, and Lisinopril   Social History   Socioeconomic History   Marital status: Single    Spouse name: Not on file   Number of children: Not on file   Years of education: Not on file   Highest education level: Not on file  Occupational History   Not on file  Tobacco Use   Smoking status: Former    Years: 29.00    Types: Cigarettes    Quit date: 12/2019    Years since quitting: 1.6   Smokeless tobacco: Never  Vaping Use   Vaping Use: Never used  Substance and Sexual Activity   Alcohol use: Not Currently    Comment: occ   Drug use: Not Currently    Types: Cocaine, Marijuana   Sexual activity: Not Currently    Birth control/protection: Surgical  Other Topics Concern    Not on file  Social History Narrative   ** Merged History Encounter **       Part-time at [employer], lives in [place], [place] 3 children  Married   Social Determinants of Radio broadcast assistant Strain: Not on file  Food Insecurity: Not on file  Transportation Needs: Not on file  Physical Activity: Not on file  Stress: Not on file  Social Connections: Not on file     Family History:  The patient's family history includes Alcohol abuse in her father, mother, and son; Cirrhosis in her mother; Diabetes type II in her father and sister; Early death in her brother and mother.   Review of Systems:    Please see the history of present illness.     All other systems reviewed and are otherwise negative except as noted above.   Physical Exam:    VS:  BP 116/72    Pulse 92    Ht $R'5\' 7"'OH$  (1.702 m)    Wt 236 lb (107 kg)    LMP 08/21/2011    SpO2 97%    BMI 36.96 kg/m    General: Well developed, well nourished,female appearing in no acute distress. Head: Normocephalic, atraumatic. Neck: No carotid bruits. JVD not elevated.  Lungs: Respirations regular and unlabored. Mild rales along right base.  Heart: Irregularly irregular. No S3 or S4. 2/6 SEM along RUSB.  Abdomen: Appears non-distended. No obvious abdominal masses. Msk:  Strength and tone appear normal for age. No obvious joint deformities or effusions. Extremities: No clubbing or cyanosis. No pitting edema.  Distal pedal pulses are 2+ bilaterally. Neuro: Alert and oriented X 3. Moves all extremities spontaneously. No focal deficits noted. Psych:  Responds to questions appropriately with a normal affect. Skin: No rashes or lesions noted  Wt Readings from Last 3  Encounters:  07/27/21 236 lb (107 kg)  07/11/21 225 lb (102.1 kg)  07/02/21 236 lb (107 kg)    Studies/Labs Reviewed:   EKG:  EKG is not ordered today.   Recent Labs: 05/10/2021: B Natriuretic Peptide 284.0; Magnesium 1.9 06/19/2021: TSH 1.82 07/11/2021: ALT 15;  BUN 24; Creatinine, Ser 1.78; Hemoglobin 10.7; Platelets 287; Potassium 3.6; Sodium 134   Lipid Panel    Component Value Date/Time   CHOL 58 07/05/2020 1359   TRIG 51 07/05/2020 1359   HDL 28 (L) 07/05/2020 1359   CHOLHDL 2.1 07/05/2020 1359   VLDL 26 12/27/2019 0305   LDLCALC 17 07/05/2020 1359    Additional studies/ records that were reviewed today include:   Echocardiogram: 12/2019 IMPRESSIONS     1. Left ventricular ejection fraction, by estimation, is 30 to 35%. The  left ventricle has moderately decreased function. The left ventricle  demonstrates global hypokinesis. The left ventricular internal cavity size  was mildly to moderately dilated.  There is moderate asymmetric left ventricular hypertrophy. Left  ventricular diastolic function could not be evaluated.   2. Right ventricular systolic function is normal. The right ventricular  size is normal.   3. Left atrial size was severely dilated.   4. Right atrial size was severely dilated.   5. The mitral valve is grossly normal. Trivial mitral valve  regurgitation. No evidence of mitral stenosis.   6. The aortic valve is grossly normal. Aortic valve regurgitation is not  visualized. Moderate aortic valve stenosis.   Limited Echo: 07/2020 IMPRESSIONS     1. Left ventricular ejection fraction, by estimation, is 50 to 55%. The  left ventricle has low normal function.   2. A small pericardial effusion is present. The pericardial effusion is  circumferential. There is no evidence of cardiac tamponade. Effusion  appears slightly larger compared to the 05/12/2020 study.   3. The inferior vena cava is normal in size with greater than 50%  respiratory variability, suggesting right atrial pressure of 3 mmHg.   Assessment:    1. Chronic combined systolic and diastolic CHF (congestive heart failure) ([place])   2. Permanent atrial fibrillation ([place])   3. Aortic valve stenosis, etiology of cardiac valve disease unspecified   4.  Essential hypertension   5. Hyperlipidemia LDL goal <70      Plan:   In order of problems listed above:  1. HFimpEF - Her EF was previously at 30 to 35% in 08/2018 and had improved to 50 to 55% by most recent echocardiogram in 07/2020. She does report intermittent dyspnea but denies any orthopnea, PND or pitting edema. Also reports occasional chest pain but this seems atypical by history but her recollection of the events is limited. Troponin values were negative during ED evaluation but BNP was elevated to 284.  - She does have mild rales on examination today, therefore I recommended that we titrate her Lasix from 20 mg daily to 40 mg daily. Repeat BMET in 2 weeks. She is already scheduled for a follow-up echocardiogram on 08/13/2021. If EF is reduced again, would plan for ischemic evaluation. Continue Coreg 25 mg twice daily, Hydralazine 25 mg TID and Imdur 60 mg daily. Previously not on ACE-I/ARB/ARNI/SGLT2 inhibitor given her variable renal function.   2. Permanent Atrial Fibrillation - This was diagnosed in 04/2019 and a rate-control strategy was initially pursued at that time given her substance use and medication noncompliance. She has been compliant with medications since her CVA and no substance use but has  severe LA dilation by echocardiogram, therefore we have continued with a rate-control strategy. - Continue Coreg 25 mg twice daily for rate control along with Eliquis 5 mg twice daily for anticoagulation which is the appropriate dose at this time based off her age, weight and kidney function.  3. Aortic Stenosis - She had moderate stenosis by echocardiogram in 07/2020. Repeat echocardiogram is pending.  4. HTN - Her BP is well-controlled at 116/72 during today's visit. Continue current medication regimen.   5. HLD - LDL was down to 17 in 07/2020. Continue Atorvastatin 80mg  daily.    Medication Adjustments/Labs and Tests Ordered: Current medicines are reviewed at length with the  patient today.  Concerns regarding medicines are outlined above.  Medication changes, Labs and Tests ordered today are listed in the Patient Instructions below. Patient Instructions  Medication Instructions:   Increase Lasix to 40 mg Daily   *If you need a refill on your cardiac medications before your next appointment, please call your pharmacy*   Lab Work: Your physician recommends that you return for lab work in: 2 weeks (BMET) May be done on the day of your Echo.   If you have labs (blood work) drawn today and your tests are completely normal, you will receive your results only by: [place] (if you have MyChart) OR A paper copy in the mail If you have any lab test that is abnormal or we need to change your treatment, we will call you to review the results.   Testing/Procedures: Your physician has requested that you have an echocardiogram. Echocardiography is a painless test that uses sound waves to create images of your heart. It provides your doctor with information about the size and shape of your heart and how well your hearts chambers and valves are working. This procedure takes approximately one hour. There are no restrictions for this procedure.    Follow-Up: At [facility], you and your health needs are our priority.  As part of our continuing mission to provide you with exceptional heart care, we have created designated Provider Care Teams.  These Care Teams include your primary Cardiologist (physician) and Advanced Practice Providers (APPs -  Physician Assistants and Nurse Practitioners) who all work together to provide you with the care you need, when you need it.  We recommend signing up for the patient portal called "MyChart".  Sign up information is provided on this After Visit Summary.  MyChart is used to connect with patients for Virtual Visits (Telemedicine).  Patients are able to view lab/test results, encounter notes, upcoming appointments, etc.  Non-urgent  messages can be sent to your provider as well.   To learn more about what you can do with MyChart, go to [email or web address].    Your next appointment:   3 month(s)  The format for your next appointment:   In [place]  Provider:   Bernerd Pho, PA-C    Other Instructions Thank you for choosing [place]!      Signed, Robin Heritage, PA-C  07/27/2021 4:44 PM    [facility] [address] [place], [place] [address] Phone: [phone] Fax: [phone]

## 2021-07-27 NOTE — Patient Instructions (Signed)
Medication Instructions:   Increase Lasix to 40 mg Daily   *If you need a refill on your cardiac medications before your next appointment, please call your pharmacy*   Lab Work: Your physician recommends that you return for lab work in: 2 weeks (BMET) May be done on the day of your Echo.   If you have labs (blood work) drawn today and your tests are completely normal, you will receive your results only by: [place] (if you have MyChart) OR A paper copy in the mail If you have any lab test that is abnormal or we need to change your treatment, we will call you to review the results.   Testing/Procedures: Your physician has requested that you have an echocardiogram. Echocardiography is a painless test that uses sound waves to create images of your heart. It provides your doctor with information about the size and shape of your heart and how well your hearts chambers and valves are working. This procedure takes approximately one hour. There are no restrictions for this procedure.    Follow-Up: At [facility], you and your health needs are our priority.  As part of our continuing mission to provide you with exceptional heart care, we have created designated Provider Care Teams.  These Care Teams include your primary Cardiologist (physician) and Advanced Practice Providers (APPs -  Physician Assistants and Nurse Practitioners) who all work together to provide you with the care you need, when you need it.  We recommend signing up for the patient portal called "MyChart".  Sign up information is provided on this After Visit Summary.  MyChart is used to connect with patients for Virtual Visits (Telemedicine).  Patients are able to view lab/test results, encounter notes, upcoming appointments, etc.  Non-urgent messages can be sent to your provider as well.   To learn more about what you can do with MyChart, go to [email or web address].    Your next appointment:   3 month(s)  The  format for your next appointment:   In [place]  Provider:   Bernerd Pho, PA-C    Other Instructions Thank you for choosing [facility]!

## 2021-08-07 LAB — HM DIABETES EYE EXAM

## 2021-08-08 ENCOUNTER — Ambulatory Visit: Payer: Medicaid Other | Admitting: Student

## 2021-08-13 ENCOUNTER — Other Ambulatory Visit: Payer: Self-pay

## 2021-08-13 ENCOUNTER — Ambulatory Visit (HOSPITAL_COMMUNITY)
Admission: RE | Admit: 2021-08-13 | Discharge: 2021-08-13 | Disposition: A | Discharge status: Home / Self Care | Payer: Medicaid Other | Source: Ambulatory Visit | Attending: Student | Admitting: Student

## 2021-08-13 ENCOUNTER — Other Ambulatory Visit (HOSPITAL_COMMUNITY)
Admission: RE | Admit: 2021-08-13 | Discharge: 2021-08-13 | Disposition: A | Discharge status: Home / Self Care | Payer: Medicaid Other | Source: Ambulatory Visit | Attending: Student | Admitting: Student

## 2021-08-13 ENCOUNTER — Other Ambulatory Visit: Payer: Self-pay | Admitting: Physician Assistant

## 2021-08-13 DIAGNOSIS — I5042 Chronic combined systolic (congestive) and diastolic (congestive) heart failure: Secondary | ICD-10-CM

## 2021-08-13 DIAGNOSIS — Z79899 Other long term (current) drug therapy: Secondary | ICD-10-CM

## 2021-08-13 DIAGNOSIS — R0609 Other forms of dyspnea: Secondary | ICD-10-CM

## 2021-08-13 LAB — ECHOCARDIOGRAM COMPLETE
AR max vel: 0.92 cm2
AV Area VTI: 0.9 cm2
AV Area mean vel: 0.95 cm2
AV Mean grad: 10.3 mmHg
AV Peak grad: 16.8 mmHg
Ao pk vel: 2.05 m/s
Area-P 1/2: 4.8 cm2
S' Lateral: 3.4 cm

## 2021-08-13 LAB — BASIC METABOLIC PANEL
Anion gap: 10 (ref 5–15)
BUN: 30 mg/dL — ABNORMAL HIGH (ref 6–20)
CO2: 20 mmol/L — ABNORMAL LOW (ref 22–32)
Calcium: 8.9 mg/dL (ref 8.9–10.3)
Chloride: 103 mmol/L (ref 98–111)
Creatinine, Ser: 2.22 mg/dL — ABNORMAL HIGH (ref 0.44–1.00)
GFR, Estimated: 26 mL/min — ABNORMAL LOW (ref >60)
Glucose, Bld: 416 mg/dL — ABNORMAL HIGH (ref 70–99)
Potassium: 3.7 mmol/L (ref 3.5–5.1)
Sodium: 133 mmol/L — ABNORMAL LOW (ref 135–145)

## 2021-08-13 MED ORDER — GABAPENTIN 100 MG PO CAPS: 200.0000 mg | ORAL_CAPSULE | Freq: Three times a day (TID) | ORAL | 1 refills | Status: DC

## 2021-08-13 NOTE — Telephone Encounter (Signed)
[name] to [facility] will not refill patient's Gabapentin due to it being increased at her last visit. They need a new prescription called in to [facility].

## 2021-08-13 NOTE — Telephone Encounter (Signed)
Robin [name], pt needs refill for Gabapentin due to increase in dose per day.

## 2021-08-13 NOTE — Progress Notes (Signed)
*  PRELIMINARY RESULTS* Echocardiogram 2D Echocardiogram has been performed.  Robin [name] 08/13/2021, 4:06 PM

## 2021-08-13 NOTE — Addendum Note (Signed)
Addended by: Levonne Hubert on: 08/13/2021 02:01 PM   Modules accepted: Orders

## 2021-08-13 NOTE — Telephone Encounter (Signed)
Spoke to [name], clarified dosage of Gabapentin pt is taking. Told her will send new Rx today to pharmacy. [name] verbalized understanding.

## 2021-08-14 ENCOUNTER — Telehealth: Payer: Self-pay

## 2021-08-14 DIAGNOSIS — Z79899 Other long term (current) drug therapy: Secondary | ICD-10-CM

## 2021-08-14 MED ORDER — FUROSEMIDE 20 MG PO TABS: ORAL_TABLET | ORAL | 3 refills | Status: DC

## 2021-08-14 NOTE — Telephone Encounter (Signed)
Spoke with sister, [name] [place].We discussed echo and lab results. She agrees to decrease lasix to 20 mg daily and repeat bmet in 3 weeks.Sister will call us if Ms.Nedrow c/o CP again.She wants to watch her.

## 2021-08-14 NOTE — Telephone Encounter (Signed)
-----   Message from [name], [place] sent at 08/14/2021 10:35 AM EST ----- Please let the patient and her sister know that the pumping function of her heart is reduced as compared to prior imaging as her ejection fraction was previously at 50 to 55% in 07/2020 and is now at 40 to 45%. Normal is 55 to 65%. Also noted to have mild aortic stenosis and mild leakage along the aortic valve which we will continue to follow over time. Her renal function has worsened as her creatinine was previously at 1.7 - 1.9 and is now elevated to 2.22. Would recommend reducing Lasix back to 20 mg daily and continuing to follow daily weights. Can take an extra 20mg  tablet if needed for edema, weight gain > 2 lbs overnight or > 5 lbs in one week.   If she still having chest pain? If so, we can arrange for a [employer] which is a stress test with medicine to assess for any evidence of significant blockages. This would not involve contrast and can be safely performed given her kidney function.  Would only pursue a cardiac catheterization if high risk given her underlying abnormal renal function.

## 2021-08-17 ENCOUNTER — Encounter: Payer: Self-pay | Admitting: Physician Assistant

## 2021-08-24 ENCOUNTER — Other Ambulatory Visit: Payer: Self-pay | Admitting: Physician Assistant

## 2021-08-24 DIAGNOSIS — I10 Essential (primary) hypertension: Secondary | ICD-10-CM

## 2021-08-28 ENCOUNTER — Other Ambulatory Visit: Payer: Self-pay

## 2021-08-28 ENCOUNTER — Emergency Department (HOSPITAL_COMMUNITY)
Admission: EM | Admit: 2021-08-28 | Discharge: 2021-08-28 | Disposition: A | Discharge status: Home / Self Care | Payer: Medicaid Other | Attending: Emergency Medicine | Admitting: Emergency Medicine

## 2021-08-28 ENCOUNTER — Encounter (HOSPITAL_COMMUNITY): Payer: Self-pay | Admitting: Emergency Medicine

## 2021-08-28 ENCOUNTER — Emergency Department (HOSPITAL_COMMUNITY): Payer: Medicaid Other

## 2021-08-28 DIAGNOSIS — J449 Chronic obstructive pulmonary disease, unspecified: Secondary | ICD-10-CM | POA: Insufficient documentation

## 2021-08-28 DIAGNOSIS — Z20822 Contact with and (suspected) exposure to covid-19: Secondary | ICD-10-CM | POA: Insufficient documentation

## 2021-08-28 DIAGNOSIS — R519 Headache, unspecified: Secondary | ICD-10-CM | POA: Diagnosis present

## 2021-08-28 DIAGNOSIS — R6883 Chills (without fever): Secondary | ICD-10-CM | POA: Insufficient documentation

## 2021-08-28 DIAGNOSIS — I482 Chronic atrial fibrillation, unspecified: Secondary | ICD-10-CM | POA: Insufficient documentation

## 2021-08-28 DIAGNOSIS — I110 Hypertensive heart disease with heart failure: Secondary | ICD-10-CM | POA: Insufficient documentation

## 2021-08-28 DIAGNOSIS — Z794 Long term (current) use of insulin: Secondary | ICD-10-CM | POA: Diagnosis not present

## 2021-08-28 DIAGNOSIS — Z7982 Long term (current) use of aspirin: Secondary | ICD-10-CM | POA: Diagnosis not present

## 2021-08-28 DIAGNOSIS — E119 Type 2 diabetes mellitus without complications: Secondary | ICD-10-CM | POA: Insufficient documentation

## 2021-08-28 DIAGNOSIS — R110 Nausea: Secondary | ICD-10-CM | POA: Diagnosis not present

## 2021-08-28 DIAGNOSIS — Z79899 Other long term (current) drug therapy: Secondary | ICD-10-CM | POA: Insufficient documentation

## 2021-08-28 DIAGNOSIS — J069 Acute upper respiratory infection, unspecified: Secondary | ICD-10-CM | POA: Insufficient documentation

## 2021-08-28 DIAGNOSIS — Z7984 Long term (current) use of oral hypoglycemic drugs: Secondary | ICD-10-CM | POA: Insufficient documentation

## 2021-08-28 DIAGNOSIS — Z7901 Long term (current) use of anticoagulants: Secondary | ICD-10-CM | POA: Insufficient documentation

## 2021-08-28 DIAGNOSIS — R040 Epistaxis: Secondary | ICD-10-CM

## 2021-08-28 DIAGNOSIS — R61 Generalized hyperhidrosis: Secondary | ICD-10-CM | POA: Diagnosis not present

## 2021-08-28 DIAGNOSIS — I509 Heart failure, unspecified: Secondary | ICD-10-CM | POA: Insufficient documentation

## 2021-08-28 LAB — CBC WITH DIFFERENTIAL/PLATELET
Abs Immature Granulocytes: 0.02 10*3/uL (ref 0.00–0.07)
Basophils Absolute: 0 10*3/uL (ref 0.0–0.1)
Basophils Relative: 1 %
Eosinophils Absolute: 0.2 10*3/uL (ref 0.0–0.5)
Eosinophils Relative: 4 %
HCT: 32.7 % — ABNORMAL LOW (ref 36.0–46.0)
Hemoglobin: 10.1 g/dL — ABNORMAL LOW (ref 12.0–15.0)
Immature Granulocytes: 0 %
Lymphocytes Relative: 34 %
Lymphs Abs: 2.3 10*3/uL (ref 0.7–4.0)
MCH: 24.9 pg — ABNORMAL LOW (ref 26.0–34.0)
MCHC: 30.9 g/dL (ref 30.0–36.0)
MCV: 80.5 fL (ref 80.0–100.0)
Monocytes Absolute: 0.6 10*3/uL (ref 0.1–1.0)
Monocytes Relative: 9 %
Neutro Abs: 3.5 10*3/uL (ref 1.7–7.7)
Neutrophils Relative %: 52 %
Platelets: 279 10*3/uL (ref 150–400)
RBC: 4.06 MIL/uL (ref 3.87–5.11)
RDW: 15.1 % (ref 11.5–15.5)
WBC: 6.6 10*3/uL (ref 4.0–10.5)
nRBC: 0 % (ref 0.0–0.2)

## 2021-08-28 LAB — RESP PANEL BY RT-PCR (FLU A&B, COVID) ARPGX2
Influenza A by PCR: NEGATIVE
Influenza B by PCR: NEGATIVE
SARS Coronavirus 2 by RT PCR: NEGATIVE

## 2021-08-28 LAB — BASIC METABOLIC PANEL
Anion gap: 4 — ABNORMAL LOW (ref 5–15)
BUN: 25 mg/dL — ABNORMAL HIGH (ref 6–20)
CO2: 23 mmol/L (ref 22–32)
Calcium: 8.6 mg/dL — ABNORMAL LOW (ref 8.9–10.3)
Chloride: 106 mmol/L (ref 98–111)
Creatinine, Ser: 1.72 mg/dL — ABNORMAL HIGH (ref 0.44–1.00)
GFR, Estimated: 35 mL/min — ABNORMAL LOW (ref >60)
Glucose, Bld: 225 mg/dL — ABNORMAL HIGH (ref 70–99)
Potassium: 4.1 mmol/L (ref 3.5–5.1)
Sodium: 133 mmol/L — ABNORMAL LOW (ref 135–145)

## 2021-08-28 LAB — TROPONIN I (HIGH SENSITIVITY)
Troponin I (High Sensitivity): 10 ng/L (ref <18)
Troponin I (High Sensitivity): 10 ng/L (ref <18)

## 2021-08-28 IMAGING — DX DG CHEST 1V PORT
1 series · 1 of 1 positions shown · non-contrast
Comparison: None.

CLINICAL DATA: No acute cardiopulmonary process.

EXAM:
PORTABLE CHEST 1 VIEW

[chest ap]
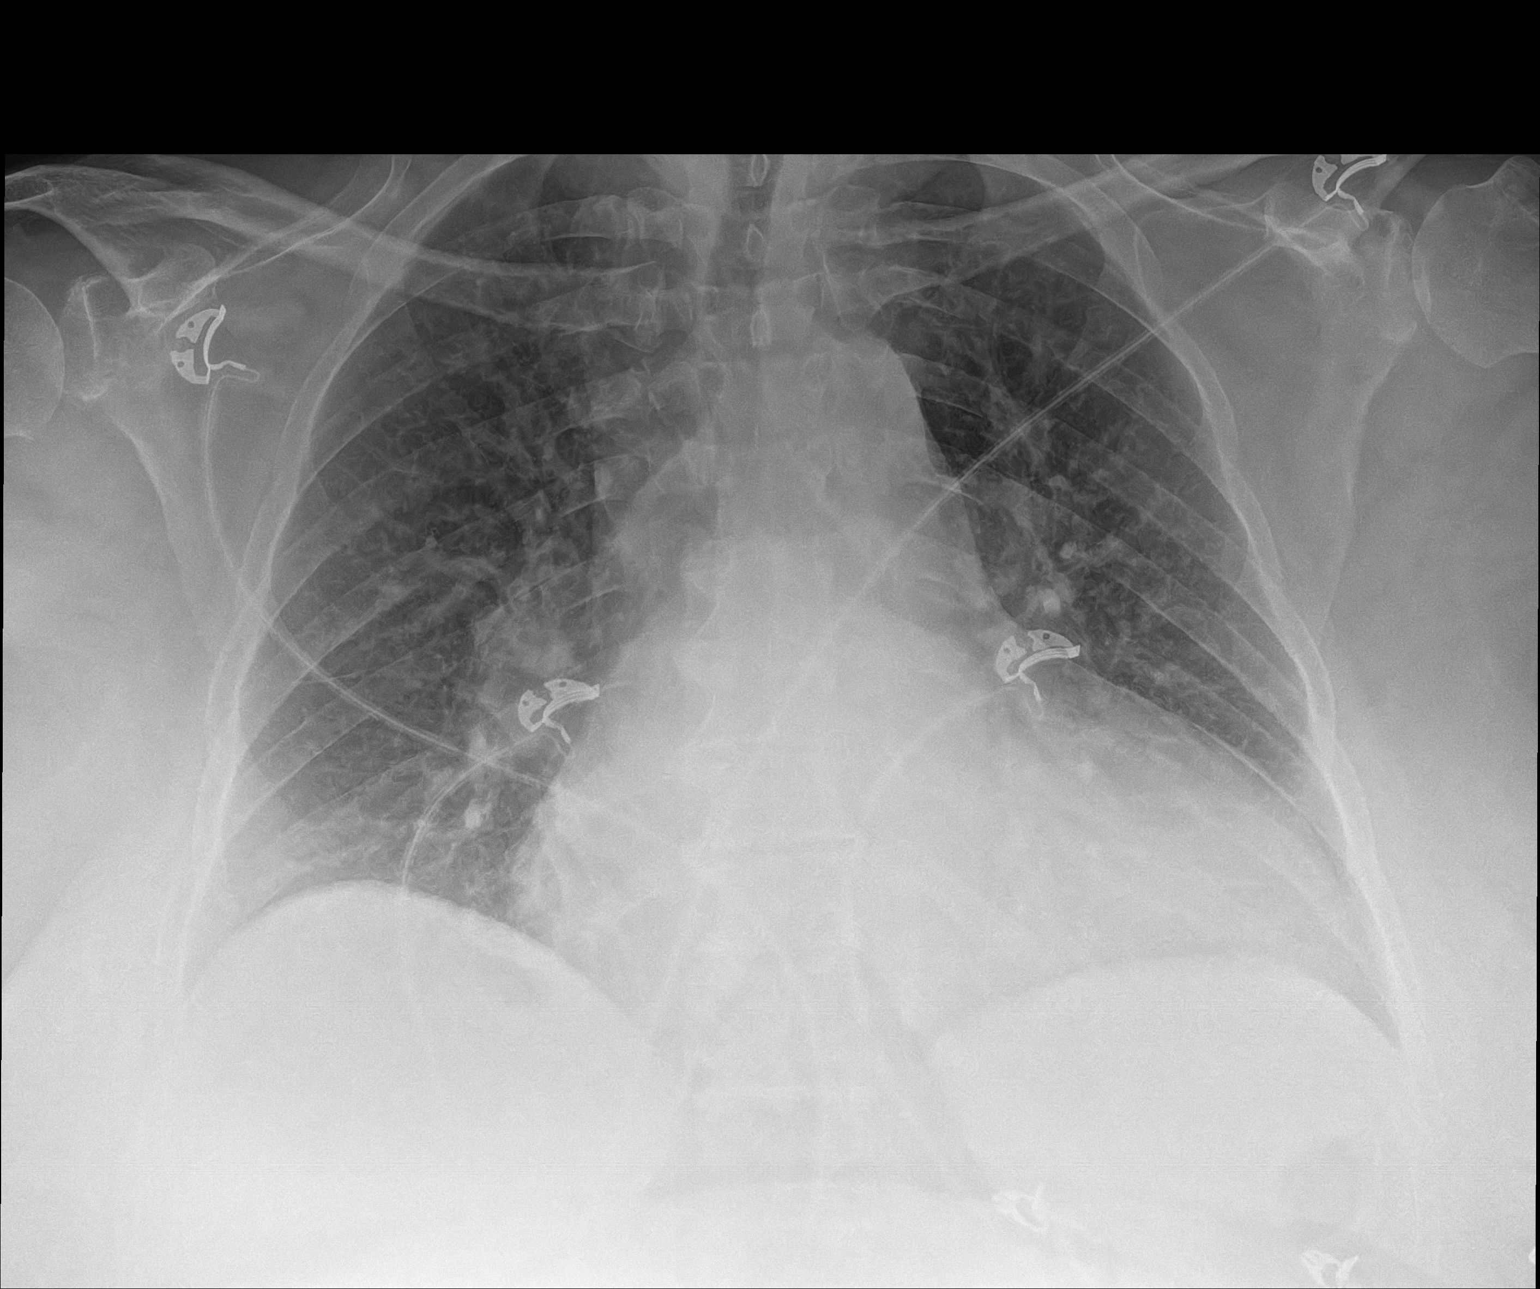

[1 of 1 positions shown; findings below may reference images not displayed]

FINDINGS: Enlarged cardiac silhouette. No effusion, infiltrate, or
pneumothorax. Mild central venous congestion. No acute osseous
abnormality.
IMPRESSION: Cardiomegaly and mild central venous congestion

## 2021-08-28 MED ORDER — LACTATED RINGERS IV BOLUS
1000.0000 mL | Freq: Once | INTRAVENOUS | Status: AC
  Administered 2021-08-28: 07:00:00 1000 mL via INTRAVENOUS

## 2021-08-28 MED ORDER — PROCHLORPERAZINE EDISYLATE 10 MG/2ML IJ SOLN
10.0000 mg | Freq: Once | INTRAMUSCULAR | Status: AC
  Administered 2021-08-28: 07:00:00 10 mg via INTRAVENOUS
  Filled 2021-08-28: qty 2

## 2021-08-28 NOTE — Discharge Instructions (Addendum)
If you develop recurrent, continued, or worsening chest pain, shortness of breath, fever, vomiting, abdominal or back pain, or any other new/concerning symptoms then return to the ER for evaluation.  

## 2021-08-28 NOTE — ED Provider Notes (Addendum)
°[facility] EMERGENCY DEPARTMENT °Provider Note ° ° °CSN: [id number] °Arrival date & time: 08/28/21  0604 ° °  ° °History ° °Chief Complaint  °Patient presents with  ° Abdominal Pain  ° ° °Robin [name] is a 56 y.o. female. ° °The history is provided by the patient.  °Abdominal Pain °He has history of hypertension, diabetes, chronic atrial fibrillation anticoagulated on apixaban, stroke, heart failure, COPD, abdominal and thoracic aneurysms and comes in complaining of headache and runny nose for the last 4 days.  She has had subjective fevers as well as chills and sweats.  There has been minimal cough.  She denies any dyspnea.  There has been some nausea but no vomiting and no diarrhea.  She denies any body aches.  History is difficult to obtain secondary to aphasia from her prior stroke. °  °Home Medications °Prior to Admission medications   °Medication Sig Start Date End Date Taking? Authorizing Provider  °Accu-Chek Softclix Lancets lancets Use as instructed 06/13/21   Shamleffer, Ibtehal Jaralla, MD  °aspirin EC 81 MG tablet Take 1 tablet (81 mg total) by mouth daily. Swallow whole. 07/19/20   Strader, Brittany M, PA-C  °baclofen 5 MG TABS Take 5 mg by mouth 2 (two) times daily. 07/02/21   Pickering, Nathan, MD  °BD PEN NEEDLE NANO U/F 32G X 4 MM MISC Inject 1 Device into the skin in the morning, at noon, in the evening, and at bedtime. 06/13/21   Shamleffer, Ibtehal Jaralla, MD  °Blood Glucose Monitoring Suppl (ACCU-CHEK GUIDE ME) w/Device KIT 1 Device by Does not apply route 3 (three) times daily. 03/09/21   Shamleffer, Ibtehal Jaralla, MD  °carvedilol (COREG) 25 MG tablet TAKE 1 TABLET BY MOUTH TWICE DAILY WITH A MEAL 08/24/21   Worley, Samantha, PA  °Continuous Blood Gluc Receiver (DEXCOM G6 RECEIVER) DEVI 1 Device by Does not apply route as directed. 06/13/21   Shamleffer, Ibtehal Jaralla, MD  °Continuous Blood Gluc Sensor (DEXCOM G6 SENSOR) MISC 1 Device by Does not apply route as directed. 06/13/21   Shamleffer,  Ibtehal Jaralla, MD  °Continuous Blood Gluc Transmit (DEXCOM G6 TRANSMITTER) MISC 1 Device by Does not apply route as directed. 06/13/21   Shamleffer, Ibtehal Jaralla, MD  °Dulaglutide (TRULICITY) 0.75 MG/0.5ML SOPN Inject 0.75 mg into the skin once a week. 06/13/21   Shamleffer, Ibtehal Jaralla, MD  °ELIQUIS 5 MG TABS tablet Take 1 tablet by mouth twice daily 08/24/21   Worley, Samantha, PA  °furosemide (LASIX) 20 MG tablet Take 20 mg daily. May take additional 20 mg if weight gain is over 2 lbs overnight, or 5 lbs in 1 week 08/14/21   Strader, Brittany M, PA-C  °gabapentin (NEURONTIN) 100 MG capsule Take 2 capsules (200 mg total) by mouth 3 (three) times daily. 08/13/21   Worley, Samantha, PA  °glucose blood (ACCU-CHEK GUIDE) test strip 1 each by Other route 3 (three) times daily. Use as instructed 06/13/21   Shamleffer, Ibtehal Jaralla, MD  °hydrALAZINE (APRESOLINE) 25 MG tablet TAKE 1 TABLET BY MOUTH THREE TIMES DAILY 08/24/21   Worley, Samantha, PA  °insulin aspart (NOVOLOG FLEXPEN) 100 UNIT/ML FlexPen Inject 12 Units into the skin 3 (three) times daily with meals. 06/13/21   Shamleffer, Ibtehal Jaralla, MD  °insulin glargine (LANTUS SOLOSTAR) 100 UNIT/ML Solostar Pen Inject 30 Units into the skin daily. 06/13/21   Shamleffer, Ibtehal Jaralla, MD  °isosorbide mononitrate (IMDUR) 30 MG 24 hr tablet Take 2 tablets by mouth once daily 08/24/21   Worley, Samantha,   Samantha, PA  metoCLOPramide (REGLAN) 10 MG tablet Take 1 tablet (10 mg total) by mouth every 6 (six) hours as needed for nausea. 07/11/21   Evalee Jefferson, PA-C  metroNIDAZOLE (FLAGYL) 500 MG tablet Take 1 tablet (500 mg total) by mouth 2 (two) times daily. 07/11/21   Evalee Jefferson, PA-C  nitroGLYCERIN (NITROSTAT) 0.4 MG SL tablet Place 1 tablet (0.4 mg total) under the tongue every 5 (five) minutes as needed for chest pain. 02/01/20   Angiulli, Lavon Paganini, PA-C  pantoprazole (PROTONIX) 20 MG tablet Take 1 tablet (20 mg total) by mouth daily. 07/11/21   Evalee Jefferson, PA-C       Allergies    Bee venom, Losartan, Naproxen, Penicillins, and Lisinopril    Review of Systems   Review of Systems  Gastrointestinal:  Positive for abdominal pain.  All other systems reviewed and are negative.  Physical Exam Updated Vital Signs Ht 5' 7" (1.702 m)    Wt 107 kg    LMP 08/21/2011    BMI 36.95 kg/m  Physical Exam Vitals and nursing note reviewed.  56 year old female, resting comfortably and in no acute distress. Vital signs are significant for borderline elevated heart rate. Oxygen saturation is 98%, which is normal. Head is normocephalic and atraumatic. PERRLA, EOMI. Oropharynx is clear. Neck is nontender and supple without adenopathy or JVD. Back is nontender and there is no CVA tenderness. Lungs are clear without rales, wheezes, or rhonchi. Chest is nontender. Heart has an irregular rhythm without murmur. Abdomen is soft, flat, nontender. Extremities have no cyanosis or edema, full range of motion is present. Skin is warm and dry without rash. Neurologic: Awake and alert.  Expressive aphasia present.  Right hemiparesis present.  ED Results / Procedures / Treatments   Labs (all labs ordered are listed, but only abnormal results are displayed) Labs Reviewed - No data to display  EKG ED ECG REPORT   Date: 08/28/2021  Rate: 109  Rhythm: atrial fibrillation  QRS Axis: normal  Intervals: normal  ST/T Wave abnormalities: normal  Conduction Disutrbances:none  Narrative Interpretation: Atrial fibrillation.  When compared with ECG of 07/11/2021, no significant changes are seen.  Old EKG Reviewed: unchanged  I have personally reviewed the EKG tracing and agree with the computerized printout as noted.   Radiology No results found.  Procedures Procedures    Medications Ordered in ED Medications  lactated ringers bolus 1,000 mL (has no administration in time range)  prochlorperazine (COMPAZINE) injection 10 mg (has no administration in time range)    ED  Course/ Medical Decision Making/ A&P                           Medical Decision Making Amount and/or Complexity of Data Reviewed Labs: ordered. Radiology: ordered.  Risk Prescription drug management.   Symptom complex consistent with viral URI.  Possible influenza, possible COVID-19.  We will check respiratory pathogen panel.  ECG was obtained showing atrial fibrillation, unchanged from prior ECG.  He will be given IV fluids, prochlorperazine and reassessed.  Old records are reviewed, and she does have a prior ED visit for a headache as well as ED visits for bronchitis and pneumonia.  Case is signed out to Dr. Regenia Skeeter.       Final Clinical Impression(s) / ED Diagnoses Final diagnoses:  Viral URI  Acute nonintractable headache, unspecified headache type    Rx / DC Orders ED Discharge Orders  None         Delora Fuel, MD 16/07/37 0701  Patient complains to treating nurse that she was also having some chest pain and shortness of breath.  Will check cardiac labs and send for chest x-ray.   Delora Fuel, MD 10/62/69 [phone]

## 2021-08-28 NOTE — ED Triage Notes (Signed)
Pt c/o abd pain, headache and states something is wrong with her nose.

## 2021-08-28 NOTE — ED Provider Notes (Signed)
7:13 AM Care transferred to me. Patient is a difficult historian due to speech problems from prior CVA.  Primarily seems to have headache. Also some chest discomfort and mild cough. Indicates she's also had some bleeding out of her nose, though none now. Workup pending.   7:20 AM I discussed with sister, Ms. Nonie Hoyer, over the phone.  She notes the patient has had nosebleeding intermittently for months and is due to see ear nose and throat at the beginning of February.  Patient was with the boyfriend last night and he brought her up.  Due to recurrent bleeding and some abdominal discomfort.  Otherwise the patient has not had a fever or other acute complaints.  She is on Eliquis.  8:37 AM patient reports she is feeling better. No nosebleeds. No abd pain, and no tenderness on exam. Chest is ok. Labs reviewed/interpreted by me, and shows troponin within normal limits as well as chronic kidney disease that appears improved when compared to a couple weeks ago.  She did endorse that she has some chest discomfort earlier this morning so we will get a second troponin.  In this scenario my suspicion of PE is pretty low, especially with her being on Eliquis.  Chest x-ray was personally reviewed and interpreted and seems to show a little bit of congestion and so after a couple 100 cc of the fluids ordered by Dr. Roxanne Mins I have stopped it and do not think she needs further fluids.  No hypoxia.  10:23 AM 2nd troponin is negative.  Updated the daughter at the bedside.  Patient has not had any more bleeding.  Vitals are okay.  Discharged home with return precautions.   Sherwood Gambler, MD 08/28/21 1024

## 2021-09-05 ENCOUNTER — Other Ambulatory Visit: Payer: Self-pay | Admitting: Physician Assistant

## 2021-10-01 ENCOUNTER — Other Ambulatory Visit: Payer: Self-pay | Admitting: Physician Assistant

## 2021-10-08 ENCOUNTER — Telehealth: Payer: Self-pay | Admitting: Physician Assistant

## 2021-10-08 MED ORDER — PANTOPRAZOLE SODIUM 40 MG PO TBEC: 40.0000 mg | DELAYED_RELEASE_TABLET | Freq: Every day | ORAL | 1 refills | Status: DC

## 2021-10-08 NOTE — Telephone Encounter (Signed)
Spoke to pt's sister [name], told her we had that she was not taking Protonix at last visit. [name] said no, when pt went to ED last she was told to increase to 40 mg daily. Told her okay will send Rx to pharmacy for her. [name] verbalized understanding. ?

## 2021-10-08 NOTE — Telephone Encounter (Signed)
Pt was told by pharmacy that our office declined refill for protonix. Does patient need to come in? Last [facility] 06/2021.  ?

## 2021-10-15 ENCOUNTER — Other Ambulatory Visit (HOSPITAL_COMMUNITY): Payer: Self-pay

## 2021-10-15 ENCOUNTER — Telehealth: Payer: Self-pay

## 2021-10-15 NOTE — Telephone Encounter (Signed)
Patient Advocate Encounter ?  ?Received notification from [facility] that prior authorization for Semglee (yfgn) 100unit pen injectors is required by his/her insurance [place] Medicaid. ?  ?PA submitted on 10/15/21 ? ?[place] Tracks#: [id number] ? ?Status is pending ?   ?[facility] will continue to follow: ? ?Patient Advocate ?Fax: [phone]  ?

## 2021-10-17 ENCOUNTER — Telehealth: Payer: Self-pay | Admitting: Internal Medicine

## 2021-10-17 ENCOUNTER — Other Ambulatory Visit (HOSPITAL_COMMUNITY): Payer: Self-pay

## 2021-10-17 NOTE — Telephone Encounter (Signed)
Patient Advocate Encounter ? ?Received notification from [place] that the request for prior authorization for Semglee pens has been denied due to the patient not trying Lantus or Semglee vials, which are preferred.   ?  ? ?Specialty Pharmacy Patient Advocate ?Fax: [phone]  ?

## 2021-10-17 NOTE — Telephone Encounter (Signed)
Patient called to check on a PA for Lantus - was advised by pharmacy that a Pa would need to be completed for Lantus ? ?Call back number is [phone] ?

## 2021-10-18 ENCOUNTER — Other Ambulatory Visit (HOSPITAL_COMMUNITY): Payer: Self-pay

## 2021-10-18 ENCOUNTER — Other Ambulatory Visit: Payer: Self-pay | Admitting: Internal Medicine

## 2021-10-18 MED ORDER — LANTUS SOLOSTAR 100 UNIT/ML ~~LOC~~ SOPN: 30.0000 [IU] | PEN_INJECTOR | Freq: Every day | SUBCUTANEOUS | 3 refills | Status: DC

## 2021-10-23 NOTE — Progress Notes (Signed)
? ?Name: Robin [name]  ?Age/ Sex: 56 y.o., female   ?MRN/ DOB: [id number], 10-17-1965    ? ?PCP: Inda Coke, PA   ?Reason for Endocrinology Evaluation: Type 2 Diabetes Mellitus  ?Initial Endocrine Consultative Visit: 10/01/2018  ? ? ?PATIENT IDENTIFIER: Robin [name] is a 56 y.o. female with a past medical history of T2DM, CHF, COPD , CVA, A.Fib with RVR and GERD. The patient has followed with Endocrinology clinic since 10/01/2018 for consultative assistance with management of her diabetes. ? ?DIABETIC HISTORY:  ?Ms. Imburgia was diagnosed with DM > 20 yrs ago. Has been on Janumet and Glipizide in the past. Has been on insulin for years. Her hemoglobin A1c has ranged from 7.9% in 2017, peaking at 11.3% in 2020 ? ?Started trulicity 27/7824 ? ?SUBJECTIVE:  ? ?During the last visit (06/14/2021): A1c 13.4 % ,increased MDI regimen and started Trulicity ? ? ?Today (10/24/2021): Ms. Yum is here for a follow up on diabetes management. She checks glucose 1-3 x a day. No hypoglycemia  ? ?She had an ED visit in 08/24/2021 for nosebleed and headaches ?She was evaluated by cardiology 07/2021 for exertional dyspnea ? ?She is accompanied by her sister and caretaker  ?Has a care taker 8 Am to 1 pm  ? ?Pt is somewhat verbal  ? ? ? ? ?HOME DIABETES REGIMEN:  ?Lantus 30 units daily ?NovoLog 12 units 3 times daily before every meal ?Trulicity 2.35 Mg weekly ? ?METER DOWNLOAD SUMMARY: Unable to download ?> 300 mg/dL  ? ? ?DIABETIC COMPLICATIONS: ?Microvascular complications:  ?Neuropathy ?Denies: CKD, retinopathy  ?Last eye exam: Completed many years ago  ? ? ?Macrovascular complications:  ?CVA 01/2020 ?Denies: CAD, PVA ? ? ? ?HISTORY:  ?Past Medical History:  ?Past Medical History:  ?Diagnosis Date  ? A-fib ([place])   ? Abdominal aortic aneurysm (AAA)   ? Allergy   ? Anemia   ? Arthritis   ? Atrial fibrillation ([place])   ? CHF (congestive heart failure) ([place])   ? a. EF at 30-35% by echo in 08/2018 b. EF at 35% by repeat echo in  05/2019  ? Chronic abdominal pain   ? Chronic atrial fibrillation (H[facility])   ? Cocaine abuse ([place])   ? COPD (chronic obstructive pulmonary disease) ([place])   ? Diabetes mellitus without complication ([place])   ? Essential hypertension, benign   ? Expressive aphasia   ? Expressive aphasia   ? post CVA  ? Fatty liver   ? GERD (gastroesophageal reflux disease)   ? Gout 2016  ? Normal coronary arteries   ? 3/10 - following abnormal Myoview  ? Ovarian cyst   ? Stroke [facility]) 12/26/2019  ? Right sided weakness, and expressive aphasia  ? Stroke [facility])   ? Thoracic ascending aortic aneurysm   ? 4 cm 10/31/19 CTA  ? Type 2 diabetes mellitus ([place])   ? type II  ? ?Past Surgical History:  ?Past Surgical History:  ?Procedure Laterality Date  ? ABDOMINAL HYSTERECTOMY  09/10/2011  ? Procedure: HYSTERECTOMY ABDOMINAL;  Surgeon: Jonnie Kind, MD;  Location: [facility];  Service: Gynecology;  Laterality: N/A;  Abdominal hysterectomy  ? CESAREAN SECTION  [id number], and 1994  ? CHOLECYSTECTOMY  1995  ? IR 3D INDEPENDENT WKST  03/16/2020  ? IR ANGIO INTRA EXTRACRAN SEL INTERNAL CAROTID BILAT MOD SED  03/16/2020  ? IR ANGIO VERTEBRAL SEL SUBCLAVIAN INNOMINATE UNI L MOD SED  03/16/2020  ? IR ANGIO VERTEBRAL SEL VERTEBRAL UNI R MOD SED  03/16/2020  ? IR CT HEAD LTD  12/27/2019  ? IR PERCUTANEOUS ART THROMBECTOMY/INFUSION INTRACRANIAL INC DIAG ANGIO  12/27/2019  ? IR US GUIDE VASC ACCESS RIGHT  12/27/2019  ? IR US GUIDE VASC ACCESS RIGHT  03/16/2020  ? RADIOLOGY WITH ANESTHESIA N/A 12/27/2019  ? Procedure: IR WITH ANESTHESIA;  Surgeon: Luanne Bras, MD;  Location: [facility];  Service: Radiology;  Laterality: N/A;  ? SCAR REVISION  09/10/2011  ? Procedure: SCAR REVISION;  Surgeon: Jonnie Kind, MD;  Location: [facility];  Service: Gynecology;  Laterality: N/A;  Wide Excision of old Cicatrix  ? TUBAL LIGATION  1994  ? ?Social History:  reports that she quit smoking about 22 months ago. Her smoking use included cigarettes. She has never used smokeless tobacco. She  reports that she does not currently use alcohol. She reports that she does not currently use drugs after having used the following drugs: Cocaine and Marijuana. ?Family History:  ?Family History  ?Problem Relation Age of Onset  ? Cirrhosis Mother   ? Early death Mother   ? Alcohol abuse Mother   ? Diabetes type II Father   ? Alcohol abuse Father   ? Diabetes type II Sister   ? Early death Brother   ? Alcohol abuse Son   ? Anesthesia problems Neg Hx   ? Hypotension Neg Hx   ? Malignant hyperthermia Neg Hx   ? Pseudochol deficiency Neg Hx   ? Colon cancer Neg Hx   ? Colon polyps Neg Hx   ? Esophageal cancer Neg Hx   ? Rectal cancer Neg Hx   ? Stomach cancer Neg Hx   ? ? ? ?HOME MEDICATIONS: ?Allergies as of 10/24/2021   ? ?   Reactions  ? Bee Venom Shortness Of Breath, Swelling  ? Bodily Swelling  ? Losartan Other (See Comments)  ? Nosebleeds per patient report.   ? Naproxen Other (See Comments)  ? Acid reflux  ? Penicillins Nausea Only  ? Has patient had a PCN reaction causing immediate rash, facial/tongue/throat swelling, SOB or lightheadedness with hypotension: no ?Has patient had a PCN reaction causing severe rash involving mucus membranes or skin necrosis: no ?Has patient had a PCN reaction that required hospitalization no ?Has patient had a PCN reaction occurring within the last 10 years: no ?If all of the above answers are "NO", then may proceed with Cephalosporin   ? Lisinopril   ? Sinus congestion  ? ?  ? ?  ?Medication List  ?  ? ?  ? Accurate as of October 24, 2021  9:37 AM. If you have any questions, ask your nurse or doctor.  ?  ?  ? ?  ? ?Accu-Chek Guide Me w/Device Kit ?1 Device by Does not apply route 3 (three) times daily. ?  ?Accu-Chek Guide test strip ?Generic drug: glucose blood ?1 each by Other route 3 (three) times daily. Use as instructed ?  ?Accu-Chek Softclix Lancets lancets ?Use as instructed ?  ?aspirin EC 81 MG tablet ?Take 1 tablet (81 mg total) by mouth daily. Swallow whole. ?  ?Baclofen 5  MG Tabs ?Take 5 mg by mouth 2 (two) times daily. ?  ?BD Pen Needle Nano U/F 32G X 4 MM Misc ?Generic drug: Insulin Pen Needle ?Inject 1 Device into the skin in the morning, at noon, in the evening, and at bedtime. ?  ?carvedilol 25 MG tablet ?Commonly known as: COREG ?TAKE 1 TABLET BY MOUTH TWICE DAILY WITH A MEAL ?  ?  Dexcom G6 Receiver Devi ?1 Device by Does not apply route as directed. ?  ?Dexcom G6 Sensor Misc ?1 Device by Does not apply route as directed. ?  ?Dexcom G6 Transmitter Misc ?1 Device by Does not apply route as directed. ?  ?Eliquis 5 MG Tabs tablet ?Generic drug: apixaban ?Take 1 tablet by mouth twice daily ?  ?furosemide 20 MG tablet ?Commonly known as: Lasix ?Take 20 mg daily. May take additional 20 mg if weight gain is over 2 lbs overnight, or 5 lbs in 1 week ?  ?gabapentin 100 MG capsule ?Commonly known as: NEURONTIN ?Take 2 capsules (200 mg total) by mouth 3 (three) times daily. ?  ?hydrALAZINE 25 MG tablet ?Commonly known as: APRESOLINE ?TAKE 1 TABLET BY MOUTH THREE TIMES DAILY ?  ?isosorbide mononitrate 30 MG 24 hr tablet ?Commonly known as: IMDUR ?Take 2 tablets by mouth once daily ?  ?Lantus SoloStar 100 UNIT/ML Solostar Pen ?Generic drug: insulin glargine ?Inject 30 Units into the skin daily. ?  ?metoCLOPramide 10 MG tablet ?Commonly known as: REGLAN ?Take 1 tablet (10 mg total) by mouth every 6 (six) hours as needed for nausea. ?  ?metroNIDAZOLE 500 MG tablet ?Commonly known as: FLAGYL ?Take 1 tablet (500 mg total) by mouth 2 (two) times daily. ?  ?nitroGLYCERIN 0.4 MG SL tablet ?Commonly known as: NITROSTAT ?Place 1 tablet (0.4 mg total) under the tongue every 5 (five) minutes as needed for chest pain. ?  ?NovoLOG FlexPen 100 UNIT/ML FlexPen ?Generic drug: insulin aspart ?Inject 12 Units into the skin 3 (three) times daily with meals. ?  ?pantoprazole 40 MG tablet ?Commonly known as: PROTONIX ?Take 1 tablet (40 mg total) by mouth daily. ?  ?Trulicity 4.17 EY/8.1KG Sopn ?Generic drug:  Dulaglutide ?Inject 0.75 mg into the skin once a week. ?  ? ?  ? ? ? ?OBJECTIVE:  ? ?Vital Signs: Ht _0  (1.702 m)   Wt 235 lb (106.6 kg)   LMP 08/21/2011   BMI 36.81 kg/m?   ?Wt Readings from Last 3 Encounte

## 2021-10-24 ENCOUNTER — Telehealth: Payer: Self-pay

## 2021-10-24 ENCOUNTER — Other Ambulatory Visit (HOSPITAL_COMMUNITY): Payer: Self-pay

## 2021-10-24 ENCOUNTER — Encounter: Payer: Self-pay | Admitting: Internal Medicine

## 2021-10-24 ENCOUNTER — Ambulatory Visit (INDEPENDENT_AMBULATORY_CARE_PROVIDER_SITE_OTHER): Payer: Medicaid Other | Admitting: Internal Medicine

## 2021-10-24 ENCOUNTER — Other Ambulatory Visit: Payer: Self-pay

## 2021-10-24 VITALS — BP 124/76 | HR 97 | Ht 67.0 in | Wt 235.0 lb

## 2021-10-24 DIAGNOSIS — E1143 Type 2 diabetes mellitus with diabetic autonomic (poly)neuropathy: Secondary | ICD-10-CM

## 2021-10-24 DIAGNOSIS — E1165 Type 2 diabetes mellitus with hyperglycemia: Secondary | ICD-10-CM | POA: Diagnosis not present

## 2021-10-24 DIAGNOSIS — N1831 Chronic kidney disease, stage 3a: Secondary | ICD-10-CM

## 2021-10-24 DIAGNOSIS — Z794 Long term (current) use of insulin: Secondary | ICD-10-CM

## 2021-10-24 DIAGNOSIS — E1159 Type 2 diabetes mellitus with other circulatory complications: Secondary | ICD-10-CM | POA: Diagnosis not present

## 2021-10-24 DIAGNOSIS — E1122 Type 2 diabetes mellitus with diabetic chronic kidney disease: Secondary | ICD-10-CM

## 2021-10-24 LAB — POCT GLYCOSYLATED HEMOGLOBIN (HGB A1C): Hemoglobin A1C: 12.3 % — AB (ref 4.0–5.6)

## 2021-10-24 MED ORDER — LANTUS SOLOSTAR 100 UNIT/ML ~~LOC~~ SOPN: 40.0000 [IU] | PEN_INJECTOR | Freq: Every day | SUBCUTANEOUS | 3 refills | Status: DC

## 2021-10-24 MED ORDER — TRULICITY 1.5 MG/0.5ML ~~LOC~~ SOAJ: 1.5000 mg | SUBCUTANEOUS | 3 refills | Status: DC

## 2021-10-24 NOTE — Telephone Encounter (Signed)
Patient Advocate Encounter ?  ?Received notification from [facility] that prior authorization for Trulicity 1.'5mg'$ /0.42m pen injectors is required by his/her insurance [place] Medicaid. ?  ?PA submitted on 10/24/21 ? ?[place] #: [id number] ? ?Status is pending ?   ?[facility] will continue to follow: ? ?Patient Advocate ?Fax: [phone] ?

## 2021-10-24 NOTE — Patient Instructions (Signed)
-   Increase Lantus to 40  units daily  ?- Novolog 12 units with Breakfast, Lunch and Dinner  ?- Increase Trulicity 1.5 mg weekly  ? ? ? ? ? ?HOW TO TREAT LOW BLOOD SUGARS (Blood sugar LESS THAN 70 MG/DL) ?Please follow the RULE OF 15 for the treatment of hypoglycemia treatment (when your (blood sugars are less than 70 mg/dL)  ? ?STEP 1: Take 15 grams of carbohydrates when your blood sugar is low, which includes:  ?3-4 GLUCOSE TABS  OR ?3-4 OZ OF JUICE OR REGULAR SODA OR ?ONE TUBE OF GLUCOSE GEL   ? ?STEP 2: RECHECK blood sugar in 15 MINUTES ?STEP 3: If your blood sugar is still low at the 15 minute recheck --> then, go back to STEP 1 and treat AGAIN with another 15 grams of carbohydrates. ?  ?

## 2021-10-24 NOTE — Telephone Encounter (Signed)
Patient Advocate Encounter ? ?Prior Authorization for Trulicity 1.'5mg'$ /0.48m pen injectors has been approved.   ? ?PA# [id number]? ?Effective dates: 10/24/21 through 10/24/22 ? ?Pt. can fill the new strength on 11/24/21 ? ? ?Patient Advocate ?Fax: [phone] ?

## 2021-11-07 ENCOUNTER — Ambulatory Visit: Payer: Medicaid Other | Admitting: Student

## 2021-11-16 ENCOUNTER — Other Ambulatory Visit: Payer: Self-pay | Admitting: Physician Assistant

## 2021-11-26 ENCOUNTER — Emergency Department (HOSPITAL_COMMUNITY): Payer: Medicaid Other

## 2021-11-26 ENCOUNTER — Encounter (HOSPITAL_COMMUNITY): Payer: Self-pay

## 2021-11-26 ENCOUNTER — Emergency Department (HOSPITAL_COMMUNITY)
Admission: EM | Admit: 2021-11-26 | Discharge: 2021-11-26 | Disposition: A | Discharge status: Home / Self Care | Payer: Medicaid Other | Source: Home / Self Care | Attending: Emergency Medicine | Admitting: Emergency Medicine

## 2021-11-26 ENCOUNTER — Other Ambulatory Visit: Payer: Self-pay

## 2021-11-26 ENCOUNTER — Inpatient Hospital Stay (HOSPITAL_COMMUNITY)
Admission: EM | Admit: 2021-11-26 | Discharge: 2021-12-01 | DRG: 291 | Disposition: A | Discharge status: Home / Self Care | Payer: Medicaid Other | Attending: Internal Medicine | Admitting: Internal Medicine

## 2021-11-26 DIAGNOSIS — Z79899 Other long term (current) drug therapy: Secondary | ICD-10-CM | POA: Insufficient documentation

## 2021-11-26 DIAGNOSIS — Z6838 Body mass index (BMI) 38.0-38.9, adult: Secondary | ICD-10-CM

## 2021-11-26 DIAGNOSIS — N179 Acute kidney failure, unspecified: Secondary | ICD-10-CM | POA: Diagnosis present

## 2021-11-26 DIAGNOSIS — I6930 Unspecified sequelae of cerebral infarction: Secondary | ICD-10-CM

## 2021-11-26 DIAGNOSIS — Z880 Allergy status to penicillin: Secondary | ICD-10-CM

## 2021-11-26 DIAGNOSIS — Z7982 Long term (current) use of aspirin: Secondary | ICD-10-CM

## 2021-11-26 DIAGNOSIS — Z888 Allergy status to other drugs, medicaments and biological substances status: Secondary | ICD-10-CM

## 2021-11-26 DIAGNOSIS — I509 Heart failure, unspecified: Principal | ICD-10-CM

## 2021-11-26 DIAGNOSIS — E876 Hypokalemia: Secondary | ICD-10-CM | POA: Diagnosis present

## 2021-11-26 DIAGNOSIS — D509 Iron deficiency anemia, unspecified: Secondary | ICD-10-CM | POA: Diagnosis present

## 2021-11-26 DIAGNOSIS — Z7901 Long term (current) use of anticoagulants: Secondary | ICD-10-CM

## 2021-11-26 DIAGNOSIS — E872 Acidosis, unspecified: Secondary | ICD-10-CM | POA: Diagnosis present

## 2021-11-26 DIAGNOSIS — Z20822 Contact with and (suspected) exposure to covid-19: Secondary | ICD-10-CM | POA: Diagnosis present

## 2021-11-26 DIAGNOSIS — R519 Headache, unspecified: Secondary | ICD-10-CM | POA: Insufficient documentation

## 2021-11-26 DIAGNOSIS — I4891 Unspecified atrial fibrillation: Secondary | ICD-10-CM | POA: Diagnosis present

## 2021-11-26 DIAGNOSIS — R059 Cough, unspecified: Secondary | ICD-10-CM | POA: Insufficient documentation

## 2021-11-26 DIAGNOSIS — F1410 Cocaine abuse, uncomplicated: Secondary | ICD-10-CM | POA: Diagnosis present

## 2021-11-26 DIAGNOSIS — N1832 Chronic kidney disease, stage 3b: Secondary | ICD-10-CM | POA: Diagnosis present

## 2021-11-26 DIAGNOSIS — Z833 Family history of diabetes mellitus: Secondary | ICD-10-CM

## 2021-11-26 DIAGNOSIS — Z87891 Personal history of nicotine dependence: Secondary | ICD-10-CM

## 2021-11-26 DIAGNOSIS — Z794 Long term (current) use of insulin: Secondary | ICD-10-CM | POA: Insufficient documentation

## 2021-11-26 DIAGNOSIS — I69320 Aphasia following cerebral infarction: Secondary | ICD-10-CM

## 2021-11-26 DIAGNOSIS — J9601 Acute respiratory failure with hypoxia: Secondary | ICD-10-CM | POA: Diagnosis present

## 2021-11-26 DIAGNOSIS — I130 Hypertensive heart and chronic kidney disease with heart failure and stage 1 through stage 4 chronic kidney disease, or unspecified chronic kidney disease: Principal | ICD-10-CM | POA: Diagnosis present

## 2021-11-26 DIAGNOSIS — R000 Tachycardia, unspecified: Secondary | ICD-10-CM | POA: Insufficient documentation

## 2021-11-26 DIAGNOSIS — J449 Chronic obstructive pulmonary disease, unspecified: Secondary | ICD-10-CM | POA: Insufficient documentation

## 2021-11-26 DIAGNOSIS — R635 Abnormal weight gain: Secondary | ICD-10-CM | POA: Diagnosis present

## 2021-11-26 DIAGNOSIS — I110 Hypertensive heart disease with heart failure: Secondary | ICD-10-CM | POA: Insufficient documentation

## 2021-11-26 DIAGNOSIS — I69351 Hemiplegia and hemiparesis following cerebral infarction affecting right dominant side: Secondary | ICD-10-CM

## 2021-11-26 DIAGNOSIS — I428 Other cardiomyopathies: Secondary | ICD-10-CM | POA: Diagnosis present

## 2021-11-26 DIAGNOSIS — I4819 Other persistent atrial fibrillation: Secondary | ICD-10-CM | POA: Diagnosis present

## 2021-11-26 DIAGNOSIS — E119 Type 2 diabetes mellitus without complications: Secondary | ICD-10-CM | POA: Insufficient documentation

## 2021-11-26 DIAGNOSIS — I5023 Acute on chronic systolic (congestive) heart failure: Secondary | ICD-10-CM | POA: Diagnosis present

## 2021-11-26 DIAGNOSIS — E1122 Type 2 diabetes mellitus with diabetic chronic kidney disease: Secondary | ICD-10-CM | POA: Diagnosis present

## 2021-11-26 DIAGNOSIS — I429 Cardiomyopathy, unspecified: Secondary | ICD-10-CM | POA: Diagnosis present

## 2021-11-26 DIAGNOSIS — Z811 Family history of alcohol abuse and dependence: Secondary | ICD-10-CM

## 2021-11-26 DIAGNOSIS — I3139 Other pericardial effusion (noninflammatory): Secondary | ICD-10-CM | POA: Diagnosis present

## 2021-11-26 DIAGNOSIS — E1165 Type 2 diabetes mellitus with hyperglycemia: Secondary | ICD-10-CM | POA: Diagnosis present

## 2021-11-26 DIAGNOSIS — E1121 Type 2 diabetes mellitus with diabetic nephropathy: Secondary | ICD-10-CM | POA: Diagnosis present

## 2021-11-26 DIAGNOSIS — E1143 Type 2 diabetes mellitus with diabetic autonomic (poly)neuropathy: Secondary | ICD-10-CM | POA: Diagnosis present

## 2021-11-26 LAB — COMPREHENSIVE METABOLIC PANEL
ALT: 27 U/L (ref 0–44)
AST: 44 U/L — ABNORMAL HIGH (ref 15–41)
Albumin: 3.9 g/dL (ref 3.5–5.0)
Alkaline Phosphatase: 101 U/L (ref 38–126)
Anion gap: 11 (ref 5–15)
BUN: 23 mg/dL — ABNORMAL HIGH (ref 6–20)
CO2: 17 mmol/L — ABNORMAL LOW (ref 22–32)
Calcium: 9.1 mg/dL (ref 8.9–10.3)
Chloride: 110 mmol/L (ref 98–111)
Creatinine, Ser: 1.62 mg/dL — ABNORMAL HIGH (ref 0.44–1.00)
GFR, Estimated: 37 mL/min — ABNORMAL LOW (ref >60)
Glucose, Bld: 297 mg/dL — ABNORMAL HIGH (ref 70–99)
Potassium: 4.7 mmol/L (ref 3.5–5.1)
Sodium: 138 mmol/L (ref 135–145)
Total Bilirubin: 1 mg/dL (ref 0.3–1.2)
Total Protein: 7.9 g/dL (ref 6.5–8.1)

## 2021-11-26 LAB — BASIC METABOLIC PANEL
Anion gap: 8 (ref 5–15)
BUN: 23 mg/dL — ABNORMAL HIGH (ref 6–20)
CO2: 20 mmol/L — ABNORMAL LOW (ref 22–32)
Calcium: 8.9 mg/dL (ref 8.9–10.3)
Chloride: 109 mmol/L (ref 98–111)
Creatinine, Ser: 1.59 mg/dL — ABNORMAL HIGH (ref 0.44–1.00)
GFR, Estimated: 38 mL/min — ABNORMAL LOW (ref >60)
Glucose, Bld: 225 mg/dL — ABNORMAL HIGH (ref 70–99)
Potassium: 4 mmol/L (ref 3.5–5.1)
Sodium: 137 mmol/L (ref 135–145)

## 2021-11-26 LAB — CBC WITH DIFFERENTIAL/PLATELET
Abs Immature Granulocytes: 0.02 10*3/uL (ref 0.00–0.07)
Abs Immature Granulocytes: 0.03 10*3/uL (ref 0.00–0.07)
Basophils Absolute: 0.1 10*3/uL (ref 0.0–0.1)
Basophils Absolute: 0.1 10*3/uL (ref 0.0–0.1)
Basophils Relative: 1 %
Basophils Relative: 1 %
Eosinophils Absolute: 0.1 10*3/uL (ref 0.0–0.5)
Eosinophils Absolute: 0 10*3/uL (ref 0.0–0.5)
Eosinophils Relative: 1 %
Eosinophils Relative: 0 %
HCT: 30.1 % — ABNORMAL LOW (ref 36.0–46.0)
HCT: 32.4 % — ABNORMAL LOW (ref 36.0–46.0)
Hemoglobin: 8.8 g/dL — ABNORMAL LOW (ref 12.0–15.0)
Hemoglobin: 9.7 g/dL — ABNORMAL LOW (ref 12.0–15.0)
Immature Granulocytes: 0 %
Immature Granulocytes: 0 %
Lymphocytes Relative: 30 %
Lymphocytes Relative: 21 %
Lymphs Abs: 2.2 10*3/uL (ref 0.7–4.0)
Lymphs Abs: 1.8 10*3/uL (ref 0.7–4.0)
MCH: 22.1 pg — ABNORMAL LOW (ref 26.0–34.0)
MCH: 22.7 pg — ABNORMAL LOW (ref 26.0–34.0)
MCHC: 29.2 g/dL — ABNORMAL LOW (ref 30.0–36.0)
MCHC: 29.9 g/dL — ABNORMAL LOW (ref 30.0–36.0)
MCV: 75.4 fL — ABNORMAL LOW (ref 80.0–100.0)
MCV: 75.7 fL — ABNORMAL LOW (ref 80.0–100.0)
Monocytes Absolute: 0.5 10*3/uL (ref 0.1–1.0)
Monocytes Absolute: 0.4 10*3/uL (ref 0.1–1.0)
Monocytes Relative: 7 %
Monocytes Relative: 5 %
Neutro Abs: 4.5 10*3/uL (ref 1.7–7.7)
Neutro Abs: 6.6 10*3/uL (ref 1.7–7.7)
Neutrophils Relative %: 61 %
Neutrophils Relative %: 73 %
Platelets: 327 10*3/uL (ref 150–400)
Platelets: 333 10*3/uL (ref 150–400)
RBC: 3.99 MIL/uL (ref 3.87–5.11)
RBC: 4.28 MIL/uL (ref 3.87–5.11)
RDW: 14.9 % (ref 11.5–15.5)
RDW: 15.4 % (ref 11.5–15.5)
WBC: 7.4 10*3/uL (ref 4.0–10.5)
WBC: 8.8 10*3/uL (ref 4.0–10.5)
nRBC: 0 % (ref 0.0–0.2)
nRBC: 0 % (ref 0.0–0.2)

## 2021-11-26 LAB — URINALYSIS, ROUTINE W REFLEX MICROSCOPIC
Bilirubin Urine: NEGATIVE
Glucose, UA: >=500 mg/dL — AB
Hgb urine dipstick: NEGATIVE
Ketones, ur: 20 mg/dL — AB
Leukocytes,Ua: NEGATIVE
Nitrite: NEGATIVE
Protein, ur: 100 mg/dL — AB
Specific Gravity, Urine: 1.022 (ref 1.005–1.030)
pH: 5 (ref 5.0–8.0)

## 2021-11-26 LAB — BLOOD GAS, VENOUS
Acid-base deficit: 5.7 mmol/L — ABNORMAL HIGH (ref 0.0–2.0)
Bicarbonate: 18.3 mmol/L — ABNORMAL LOW (ref 20.0–28.0)
Drawn by: 6381
FIO2: 21 %
O2 Saturation: 56.1 %
Patient temperature: 36.5
pCO2, Ven: 30 mmHg — ABNORMAL LOW (ref 44–60)
pH, Ven: 7.39 (ref 7.25–7.43)
pO2, Ven: 31 mmHg — CL (ref 32–45)

## 2021-11-26 LAB — MAGNESIUM: Magnesium: 2.2 mg/dL (ref 1.7–2.4)

## 2021-11-26 LAB — ETHANOL: Alcohol, Ethyl (B): <10 mg/dL (ref <10)

## 2021-11-26 LAB — PROTIME-INR
INR: 1.4 — ABNORMAL HIGH (ref 0.8–1.2)
Prothrombin Time: 16.6 seconds — ABNORMAL HIGH (ref 11.4–15.2)

## 2021-11-26 LAB — RESP PANEL BY RT-PCR (FLU A&B, COVID) ARPGX2
Influenza A by PCR: NEGATIVE
Influenza B by PCR: NEGATIVE
SARS Coronavirus 2 by RT PCR: NEGATIVE

## 2021-11-26 LAB — TROPONIN I (HIGH SENSITIVITY): Troponin I (High Sensitivity): 12 ng/L (ref <18)

## 2021-11-26 LAB — LACTIC ACID, PLASMA: Lactic Acid, Venous: 3.1 mmol/L (ref 0.5–1.9)

## 2021-11-26 LAB — CBG MONITORING, ED: Glucose-Capillary: 281 mg/dL — ABNORMAL HIGH (ref 70–99)

## 2021-11-26 LAB — RAPID URINE DRUG SCREEN, HOSP PERFORMED
Amphetamines: NOT DETECTED
Barbiturates: NOT DETECTED
Benzodiazepines: NOT DETECTED
Cocaine: NOT DETECTED
Opiates: NOT DETECTED
Tetrahydrocannabinol: NOT DETECTED

## 2021-11-26 LAB — BRAIN NATRIURETIC PEPTIDE: B Natriuretic Peptide: 1106 pg/mL — ABNORMAL HIGH (ref 0.0–100.0)

## 2021-11-26 IMAGING — CT CT HEAD W/O CM
4 series · 17 of 47 positions shown, 19 images · non-contrast
Comparison: CT head [DATE].

CLINICAL DATA: Headache, new or worsening (Age >= 50y)



[Series 2: head bone · axial · 0.41mm/px · z∈[-8,+44]mm · 4 of 76 slices shown]
[im 8/76  bone]
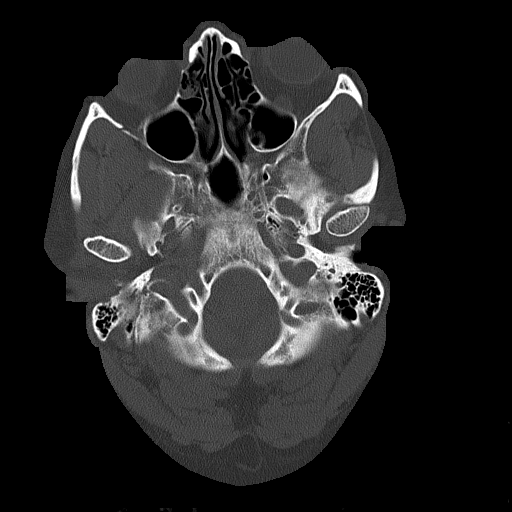
[im 16/76  bone]
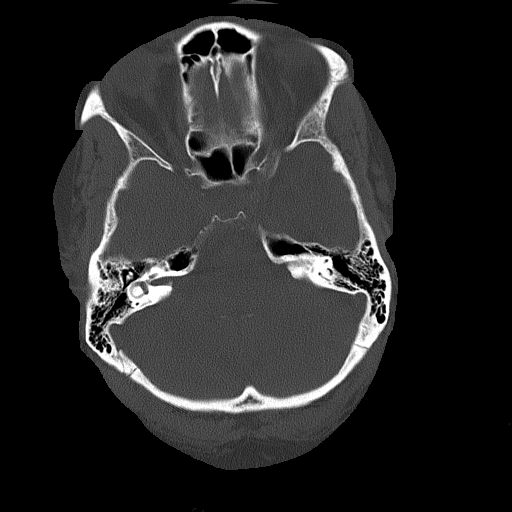
[im 23/76  bone]
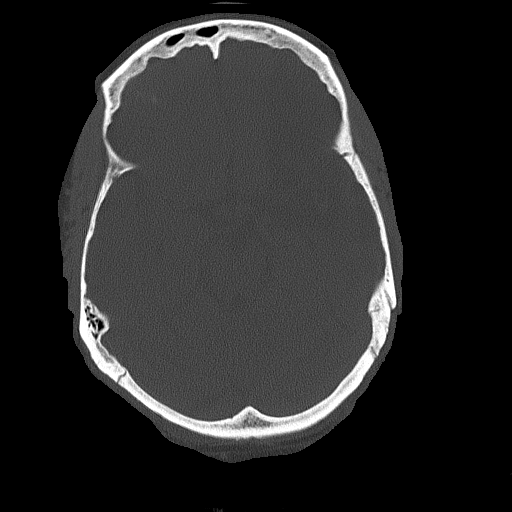
[im 34/76  bone]
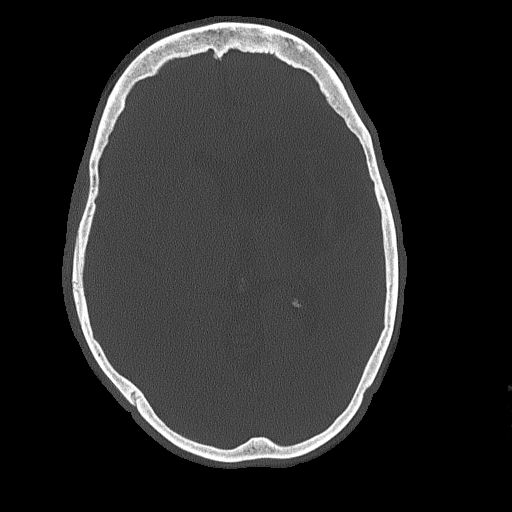

[Series 3: head w o · axial · 0.41mm/px · z∈[-7,+108]mm · 7 of 31 slices shown, 9 images]
[im 4/31  brain]
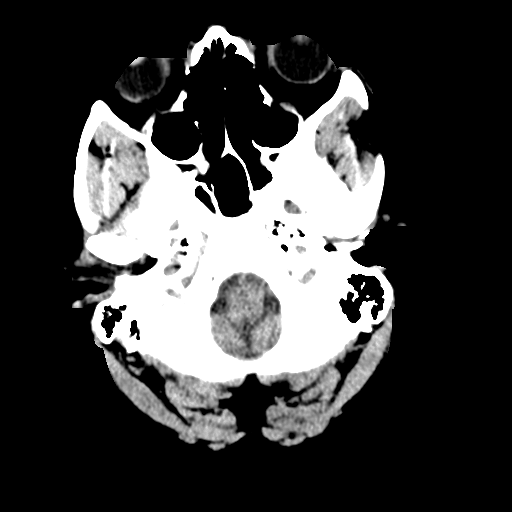
[im 4/31  bone]
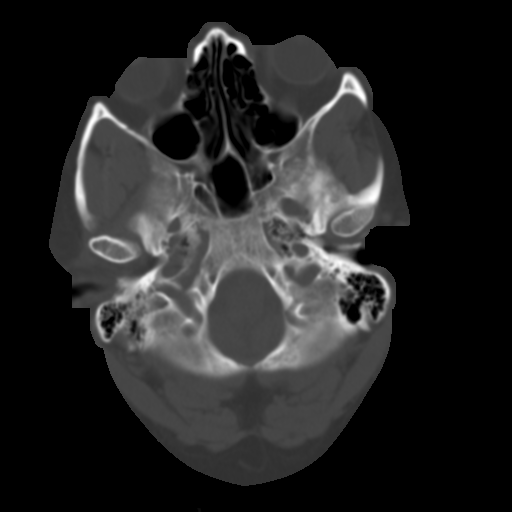
[im 8/31  brain]
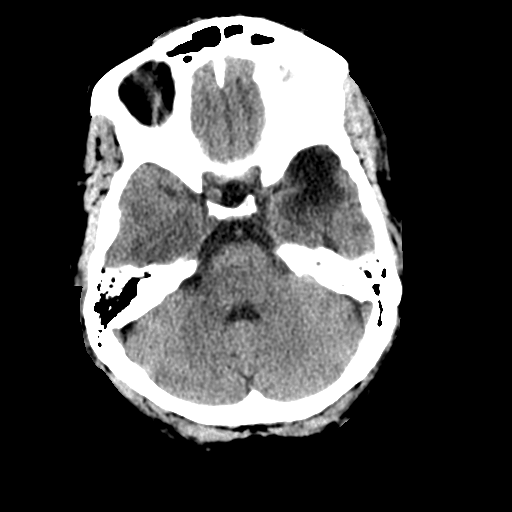
[im 12/31  brain]
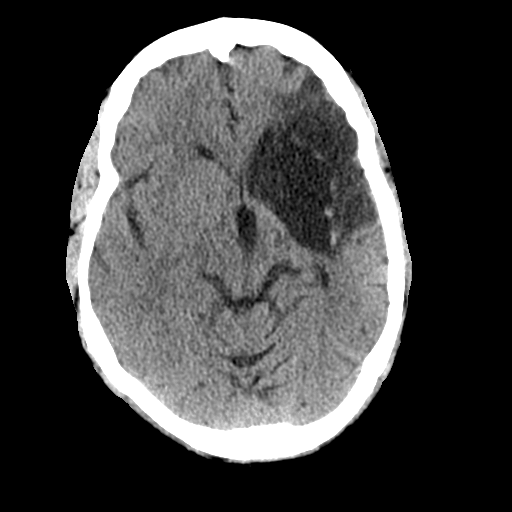
[im 16/31  brain]
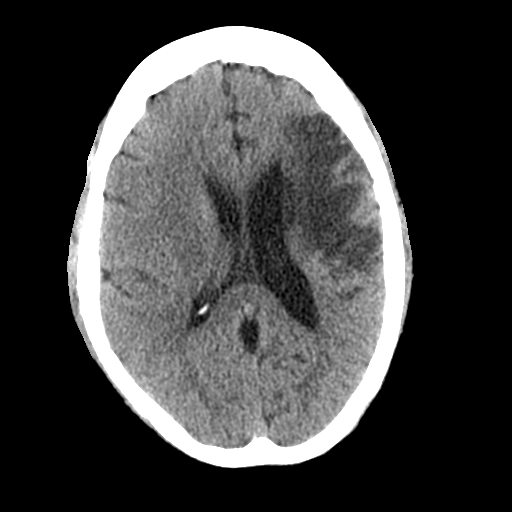
[im 19/31  brain]
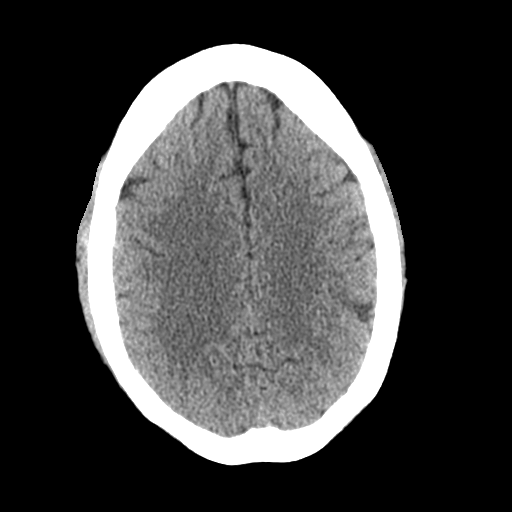
[im 19/31  bone]
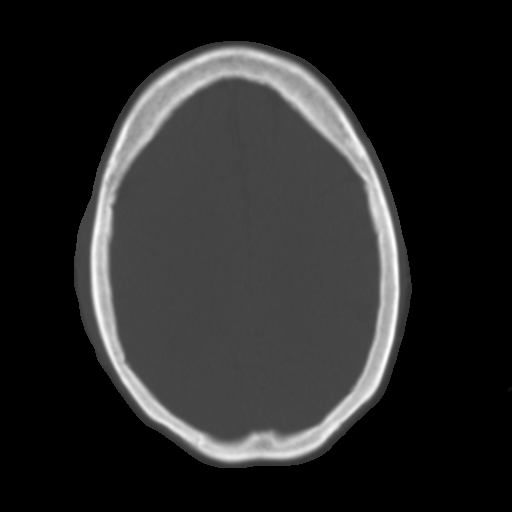
[im 23/31  brain]
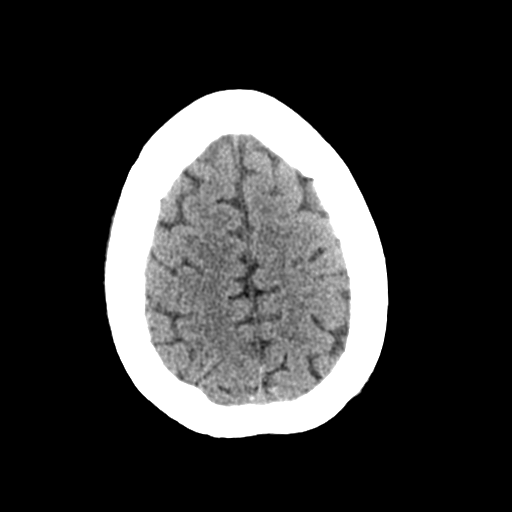
[im 27/31  brain]
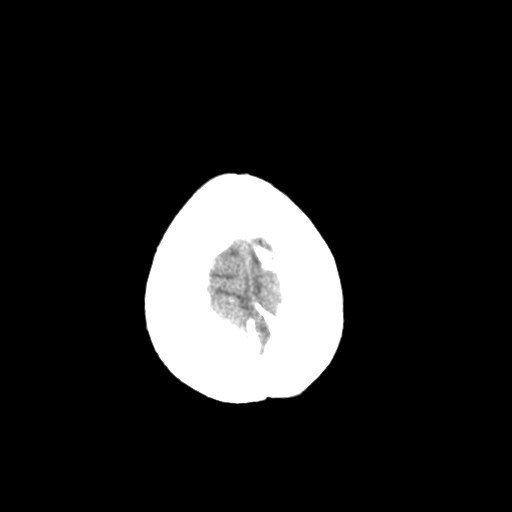

[Series 4: coronal soft · coronal · 0.33mm/px · 3 of 78 slices shown]
[im 26/78  brain]
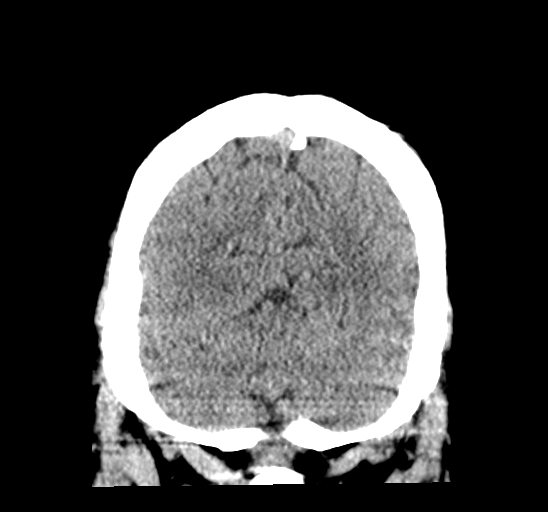
[im 35/78  brain]
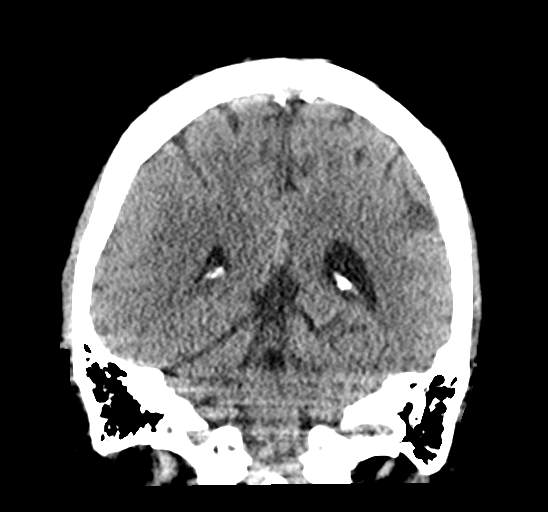
[im 43/78  brain]
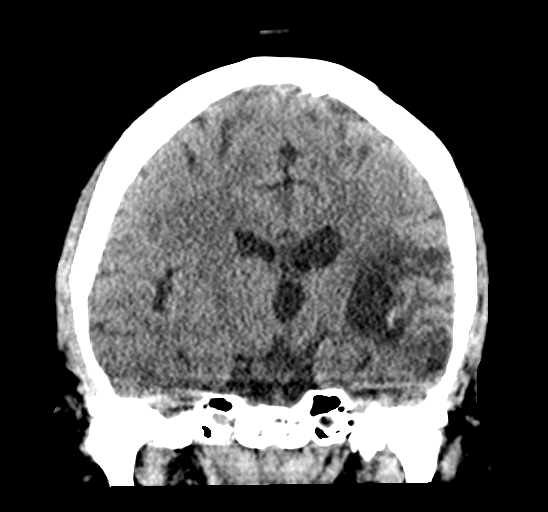

[Series 5: sagittal soft · sagittal · 0.36mm/px · 3 of 63 slices shown]
[im 21/63  brain]
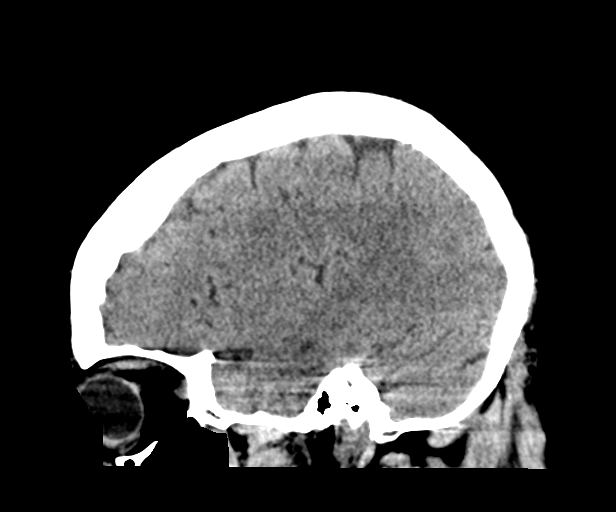
[im 32/63  brain]
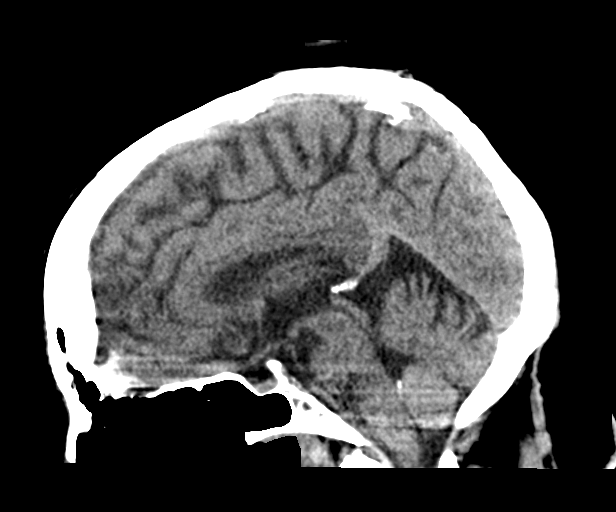
[im 42/63  brain]
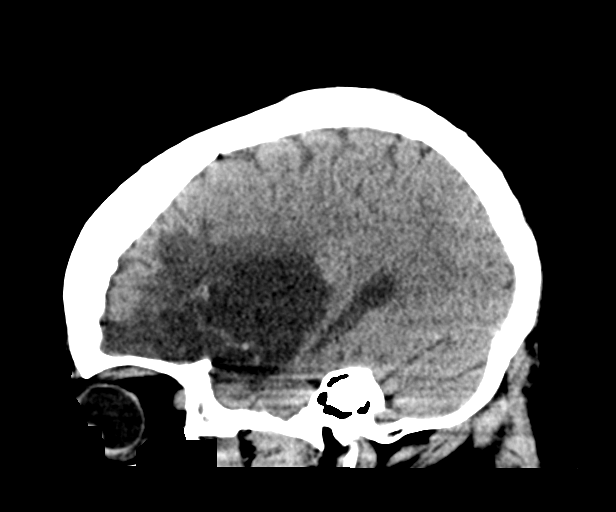

[17 of 47 positions shown; findings below may reference images not displayed]

FINDINGS: Brain: Similar remote left anterior MCA territory infarct. No
evidence of acute large vascular territory infarct. No evidence of
acute hemorrhage, mass lesion, midline shift, or hydrocephalus.

Vascular: No hyperdense vessel identified.

Skull: No acute fracture.

Sinuses/Orbits: Clear visualized sinuses. No acute orbital findings.

Other: No mastoid effusions.
IMPRESSION: 1. No evidence of acute intracranial abnormality.
2. Remote left anterior MCA territory infarct.

## 2021-11-26 IMAGING — DX DG CHEST 1V PORT
1 series · 1 of 1 positions shown · non-contrast
Comparison: Chest x-ray [DATE].

CLINICAL DATA: Weakness and shortness of breath.

EXAM:
PORTABLE CHEST 1 VIEW

[chest ap]
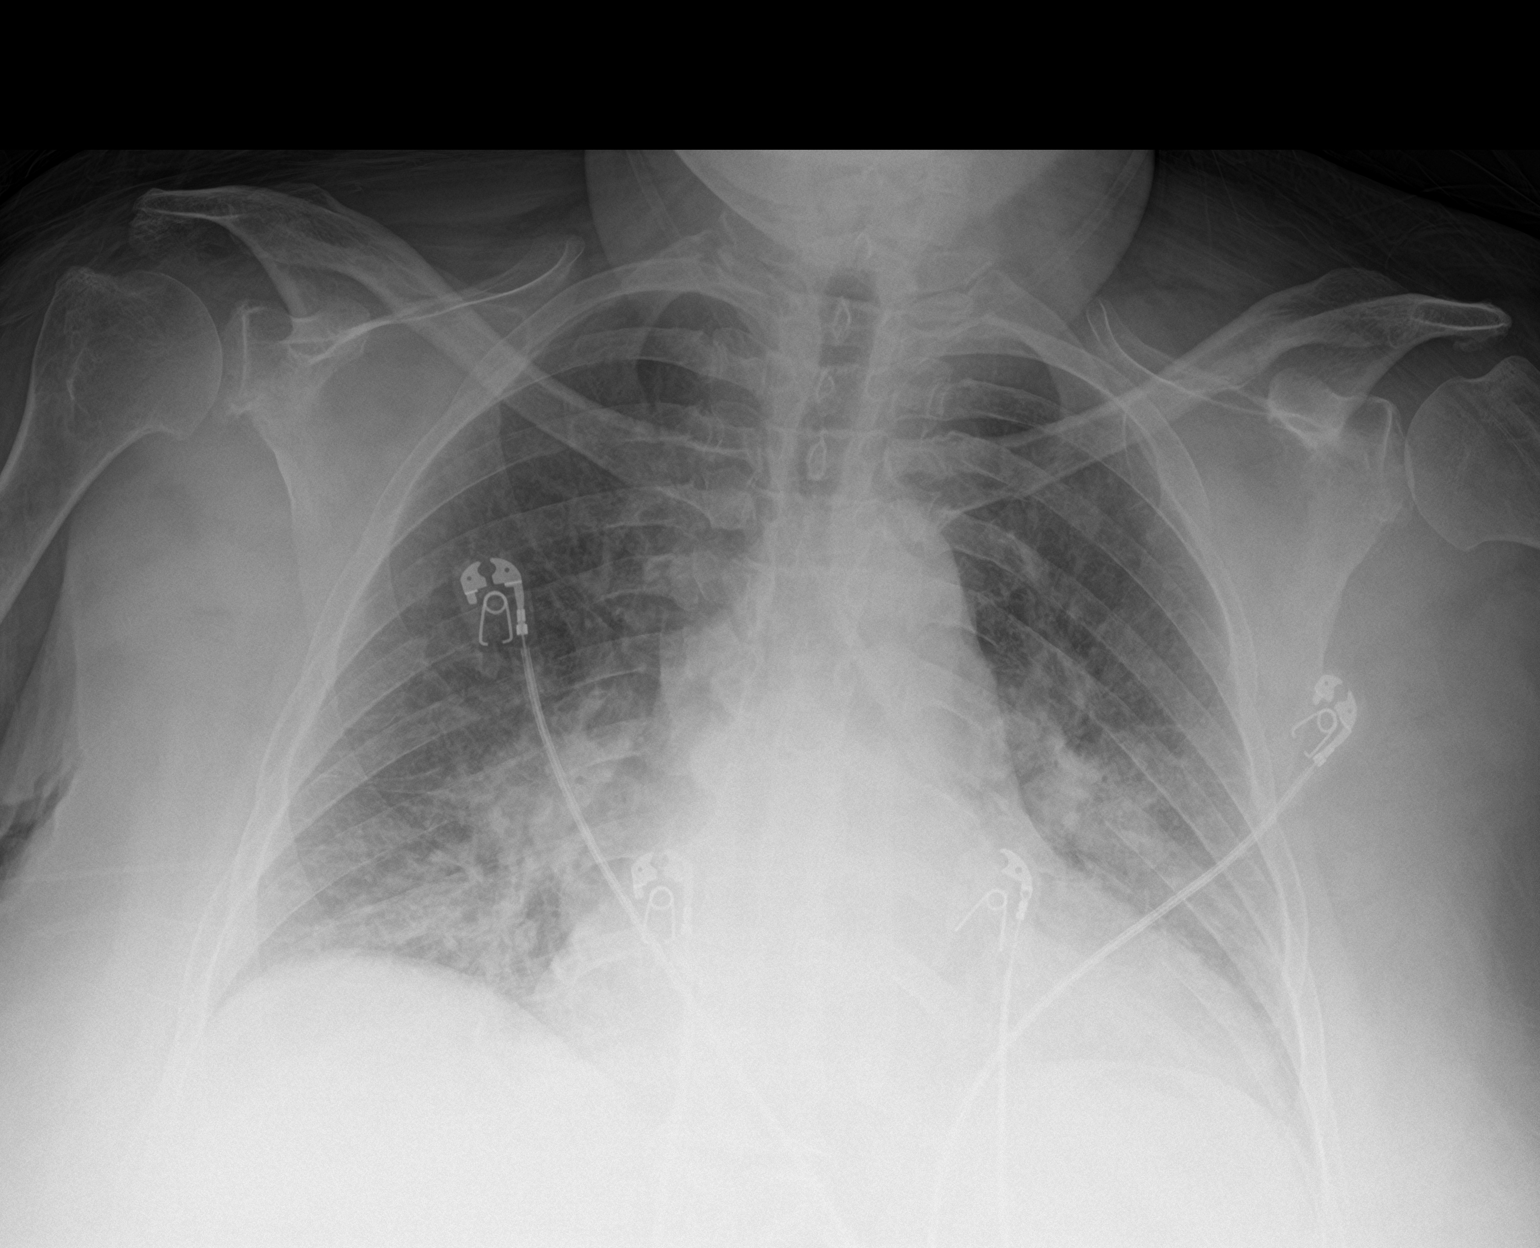

[1 of 1 positions shown; findings below may reference images not displayed]

FINDINGS: The heart is enlarged. There is central pulmonary vascular
congestion and bilateral hilar prominence. There are minimal patchy
bibasilar opacities. Costophrenic angles are clear. No pneumothorax.
IMPRESSION: 1. Cardiomegaly with mild pulmonary edema pattern.
2. Bilateral hilar prominence may be related to edema. Recommend
follow-up PA and lateral chest x-ray in 4-6 weeks to confirm
resolution.

## 2021-11-26 IMAGING — CT CT HEAD W/O CM
4 series · 16 of 47 positions shown, 18 images · non-contrast
Comparison: [DATE]

CLINICAL DATA: Mental status change, unknown cause



[Series 2: head w o · axial · 0.41mm/px · z∈[+48,+158]mm · 7 of 30 slices shown, 9 images]
[im 4/30  brain]
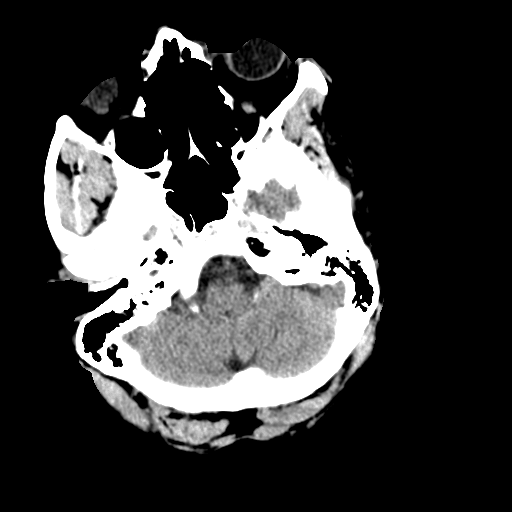
[im 4/30  bone]
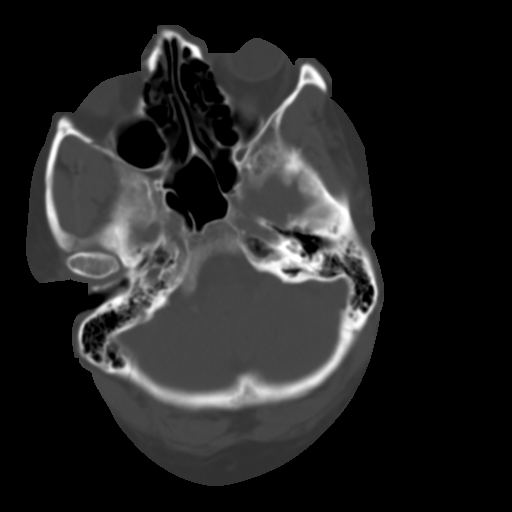
[im 8/30  brain]
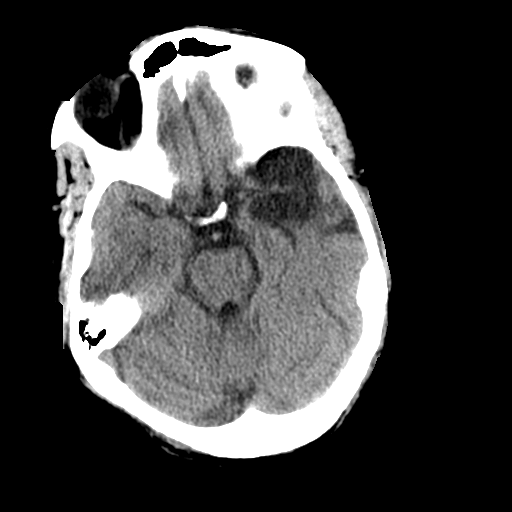
[im 11/30  brain]
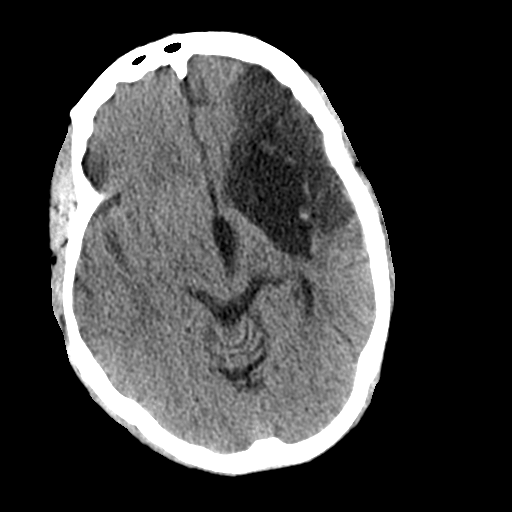
[im 15/30  brain]
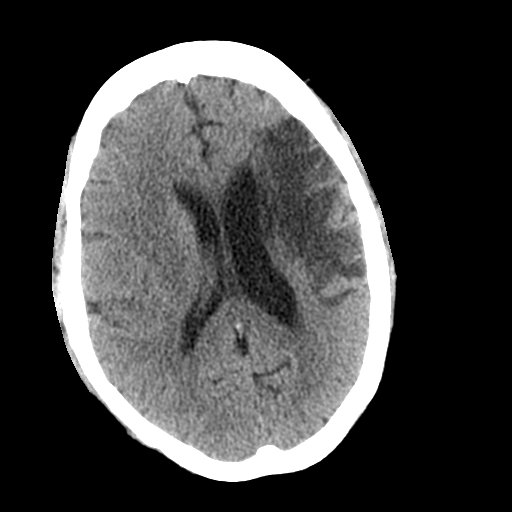
[im 19/30  brain]
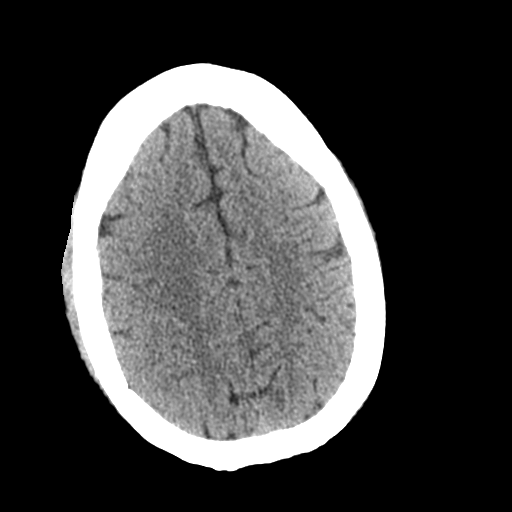
[im 19/30  bone]
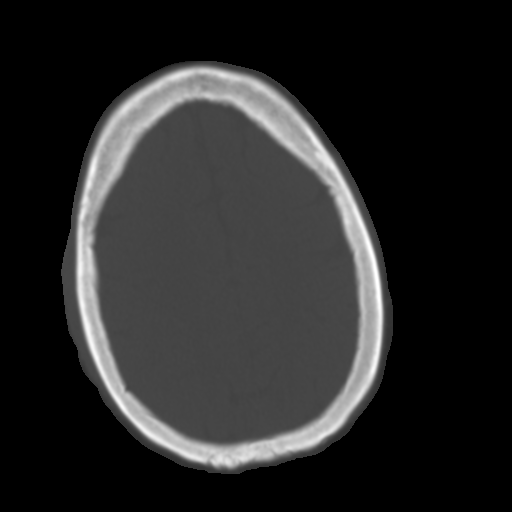
[im 22/30  brain]
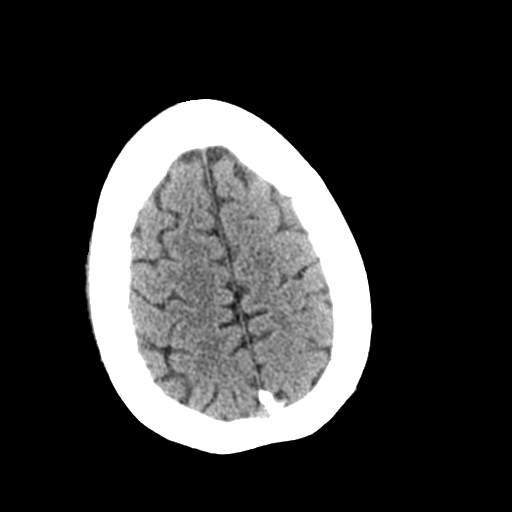
[im 26/30  brain]
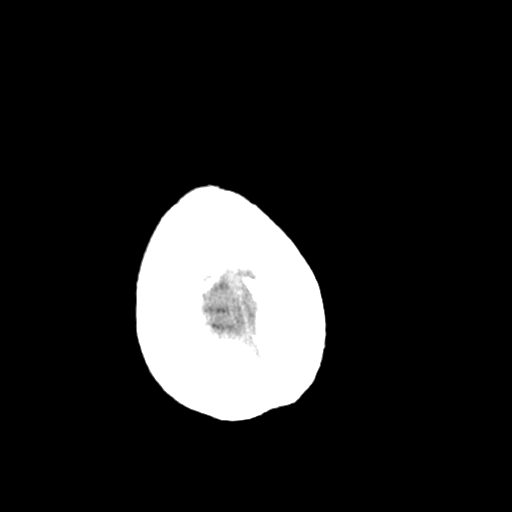

[Series 3: head bone · axial · 0.41mm/px · z∈[+47,+75]mm · 3 of 73 slices shown]
[im 8/73  bone]
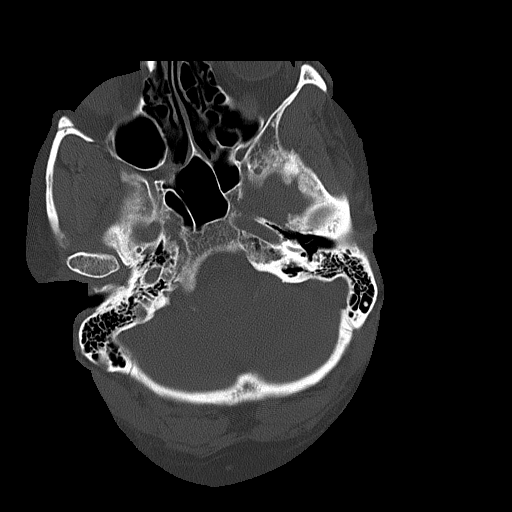
[im 15/73  bone]
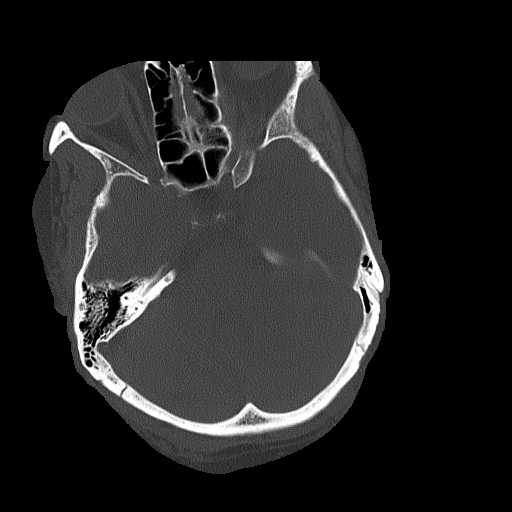
[im 22/73  bone]
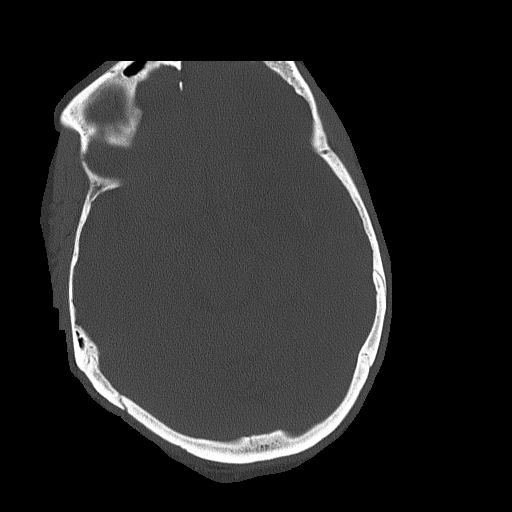

[Series 4: coronal soft · coronal · 0.30mm/px · 3 of 63 slices shown]
[im 21/63  brain]
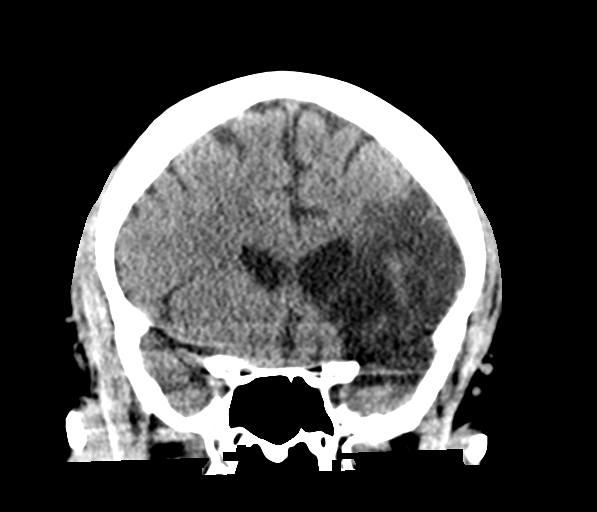
[im 28/63  brain]
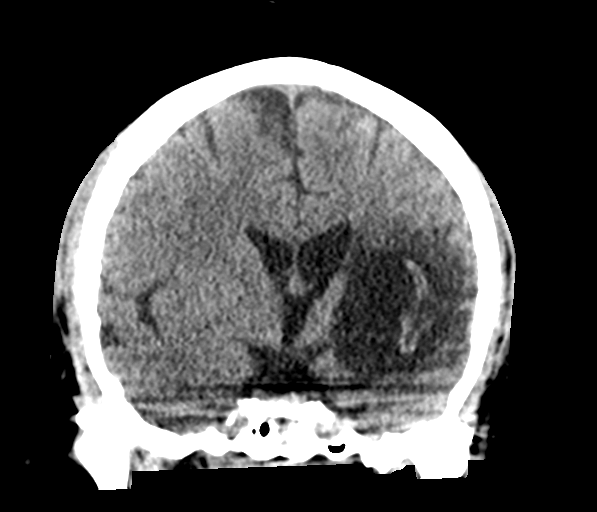
[im 35/63  brain]
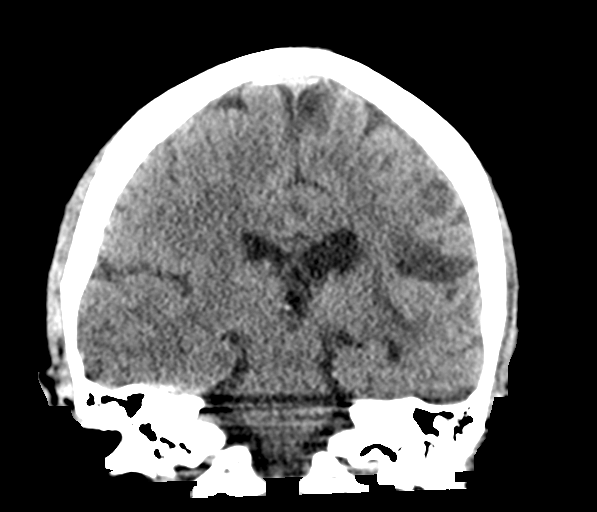

[Series 5: sagittal soft · sagittal · 0.31mm/px · 3 of 52 slices shown]
[im 18/52  brain]
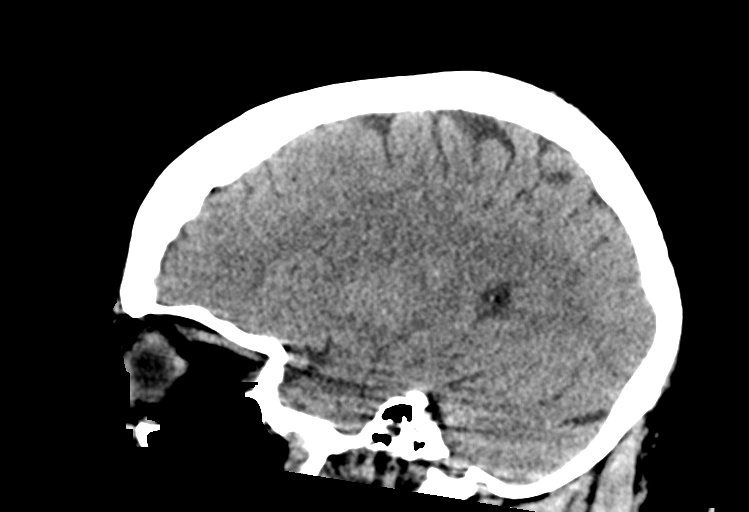
[im 26/52  brain]
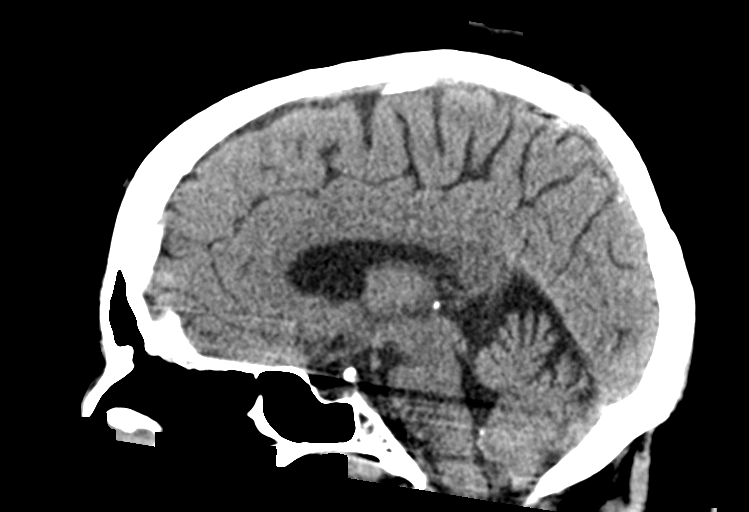
[im 35/52  brain]
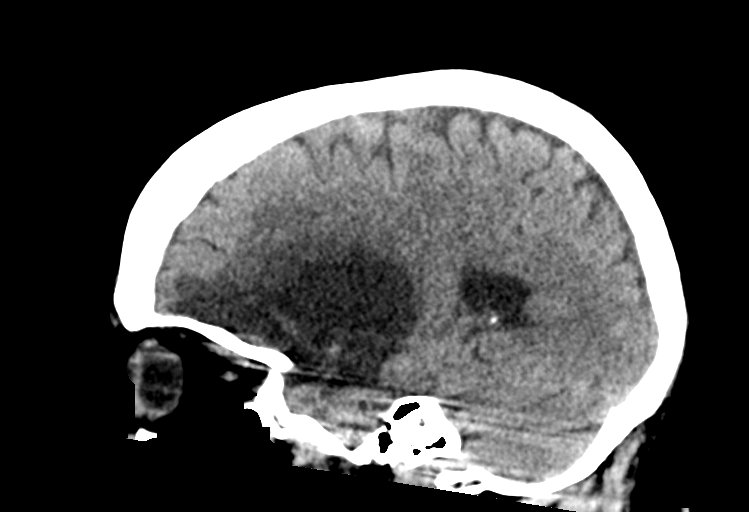

[16 of 47 positions shown; findings below may reference images not displayed]

FINDINGS: Brain: Remote left MCA infarct with encephalomalacia, stable. No
acute intracranial abnormality. Specifically, no hemorrhage,
hydrocephalus, mass lesion, acute infarction, or significant
intracranial injury.

Vascular: No hyperdense vessel or unexpected calcification.

Skull: No acute calvarial abnormality.

Sinuses/Orbits: No acute findings

Other: None
IMPRESSION: Old left MCA infarct with encephalomalacia.

No acute intracranial abnormality.

## 2021-11-26 MED ORDER — SODIUM CHLORIDE 0.9 % IV BOLUS
500.0000 mL | Freq: Once | INTRAVENOUS | Status: AC
Start: 2021-11-26 — End: 2021-11-26
  Administered 2021-11-26: 500 mL via INTRAVENOUS

## 2021-11-26 MED ORDER — FUROSEMIDE 10 MG/ML IJ SOLN
40.0000 mg | Freq: Once | INTRAMUSCULAR | Status: AC
  Administered 2021-11-27: 40 mg via INTRAVENOUS
  Filled 2021-11-26: qty 4

## 2021-11-26 MED ORDER — METOPROLOL TARTRATE 25 MG PO TABS
25.0000 mg | ORAL_TABLET | Freq: Once | ORAL | Status: AC
  Administered 2021-11-26: 25 mg via ORAL
  Filled 2021-11-26: qty 1

## 2021-11-26 MED ORDER — ACETAMINOPHEN 325 MG PO TABS
650.0000 mg | ORAL_TABLET | Freq: Once | ORAL | Status: AC
  Administered 2021-11-26: 650 mg via ORAL
  Filled 2021-11-26: qty 2

## 2021-11-26 MED ORDER — METOPROLOL TARTRATE 5 MG/5ML IV SOLN
5.0000 mg | Freq: Once | INTRAVENOUS | Status: AC
  Administered 2021-11-26: 5 mg via INTRAVENOUS
  Filled 2021-11-26: qty 5

## 2021-11-26 MED ORDER — PROCHLORPERAZINE EDISYLATE 10 MG/2ML IJ SOLN
10.0000 mg | Freq: Once | INTRAMUSCULAR | Status: AC
  Administered 2021-11-26: 10 mg via INTRAVENOUS
  Filled 2021-11-26: qty 2

## 2021-11-26 NOTE — ED Notes (Signed)
Dr. Doren Custard notified of neuro status. EDP at bedside assessing patient at this time.  ?

## 2021-11-26 NOTE — ED Notes (Signed)
Patient transported to CT 

## 2021-11-26 NOTE — ED Triage Notes (Signed)
Patient complaining of headache and facial numbness to right side since 1930 yesterday. Patient with previous stroke and deficits to right side. Speech is normal per EMS.  ?

## 2021-11-26 NOTE — Discharge Instructions (Signed)
Your work-up today was reassuring against a serious cause of your headache.  You can take Tylenol at home when you have a headache.  I recommend you schedule a follow-up appointment with your primary care provider to discuss other options for your headache.  You did have an elevated heart rate in the emergency room.  You were given an IV medication which improved your heart rate.  He also have not taken your home medications prior to coming to the emergency room.  Please take these when you get home.  If you have any worsening symptoms you can return to the emergency room. ?

## 2021-11-26 NOTE — ED Notes (Signed)
MD aware of patient complaints. No code stroke called at this time. ?

## 2021-11-26 NOTE — ED Provider Notes (Signed)
?[facility] ?Provider Note ? ? ?CSN: [id number] ?Arrival date & time: 11/26/21  1438 ? ?  ? ?History ? ?Chief Complaint  ?Patient presents with  ? Headache  ? ? ?Robin [name] is a 56 y.o. female. ? ?56 year old female with past medical history significant for A-fib, CHF, stroke with right-sided deficits, presents today for evaluation of headache that started around 730 last night.  She has not taken anything for this prior to arrival.  She presents via EMS.  She denies chest pain, shortness of breath, nausea, vomiting, or any other associated symptoms.  She is coughing during exam which she states started yesterday. ? ?The history is provided by the patient. No language interpreter was used.  ? ?  ? ?Home Medications ?Prior to Admission medications   ?Medication Sig Start Date End Date Taking? Authorizing Provider  ?Accu-Chek Softclix Lancets lancets Use as instructed 06/13/21   Shamleffer, Melanie Crazier, MD  ?aspirin EC 81 MG tablet Take 1 tablet (81 mg total) by mouth daily. Swallow whole. 07/19/20   Strader, Fransisco Hertz, PA-C  ?baclofen 5 MG TABS Take 5 mg by mouth 2 (two) times daily. ?Patient not taking: Reported on 08/28/2021 07/02/21   Davonna Belling, MD  ?BD PEN NEEDLE NANO U/F 32G X 4 MM MISC Inject 1 Device into the skin in the morning, at noon, in the evening, and at bedtime. 06/13/21   Shamleffer, Melanie Crazier, MD  ?Blood Glucose Monitoring Suppl (ACCU-CHEK GUIDE ME) w/Device KIT 1 Device by Does not apply route 3 (three) times daily. 03/09/21   Shamleffer, Melanie Crazier, MD  ?carvedilol (COREG) 25 MG tablet TAKE 1 TABLET BY MOUTH TWICE DAILY WITH A MEAL 08/24/21   Inda Coke, PA  ?Dulaglutide (TRULICITY) 1.5 OI/7.5ZV SOPN Inject 1.5 mg into the skin once a week. 10/24/21   Shamleffer, Melanie Crazier, MD  ?Arne [place] 5 MG TABS tablet Take 1 tablet by mouth twice daily 08/24/21   Inda Coke, PA  ?furosemide (LASIX) 20 MG tablet Take 20 mg daily. May take additional 20 mg  if weight gain is over 2 lbs overnight, or 5 lbs in 1 week 08/14/21   Ahmed Prima, Fransisco Hertz, PA-C  ?gabapentin (NEURONTIN) 100 MG capsule TAKE 2 CAPSULES BY MOUTH THREE TIMES DAILY 11/16/21   Inda Coke, PA  ?glucose blood (ACCU-CHEK GUIDE) test strip 1 each by Other route 3 (three) times daily. Use as instructed 06/13/21   Shamleffer, Melanie Crazier, MD  ?hydrALAZINE (APRESOLINE) 25 MG tablet TAKE 1 TABLET BY MOUTH THREE TIMES DAILY 08/24/21   Inda Coke, PA  ?insulin aspart (NOVOLOG FLEXPEN) 100 UNIT/ML FlexPen Inject 12 Units into the skin 3 (three) times daily with meals. 06/13/21   Shamleffer, Melanie Crazier, MD  ?insulin glargine (LANTUS SOLOSTAR) 100 UNIT/ML Solostar Pen Inject 40 Units into the skin daily. 10/24/21   Shamleffer, Melanie Crazier, MD  ?isosorbide mononitrate (IMDUR) 30 MG 24 hr tablet Take 2 tablets by mouth once daily 08/24/21   Inda Coke, PA  ?metoCLOPramide (REGLAN) 10 MG tablet Take 1 tablet (10 mg total) by mouth every 6 (six) hours as needed for nausea. 07/11/21   Evalee Jefferson, PA-C  ?nitroGLYCERIN (NITROSTAT) 0.4 MG SL tablet Place 1 tablet (0.4 mg total) under the tongue every 5 (five) minutes as needed for chest pain. 02/01/20   Angiulli, Lavon Paganini, PA-C  ?pantoprazole (PROTONIX) 40 MG tablet Take 1 tablet (40 mg total) by mouth daily. 10/08/21   Inda Coke, [place]  ?   ? ?Allergies    ?  Bee venom, Losartan, Naproxen, Penicillins, and Lisinopril   ? ?Review of Systems   ?Review of Systems  ?Constitutional:  Negative for chills and fever.  ?Eyes:  Negative for photophobia and visual disturbance.  ?Respiratory:  Positive for cough. Negative for shortness of breath.   ?Cardiovascular:  Negative for chest pain.  ?Gastrointestinal:  Negative for nausea.  ?Neurological:  Positive for headaches. Negative for weakness and light-headedness.  ?All other systems reviewed and are negative. ? ?Physical Exam ?Updated Vital Signs ?BP (!) 141/89   Pulse (!) 105   Temp 97.7 ?F (36.5 ?C)    Resp 18   Ht $R'5\' 7"'HA$  (1.702 m)   Wt 106.6 kg   LMP 08/21/2011   SpO2 95%   BMI 36.81 kg/m?  ?Physical Exam ?Vitals and nursing note reviewed.  ?Constitutional:   ?   General: She is not in acute distress. ?   Appearance: Normal appearance. She is not ill-appearing.  ?HENT:  ?   Head: Normocephalic and atraumatic.  ?   Nose: Nose normal.  ?Eyes:  ?   General: No scleral icterus. ?   Extraocular Movements: Extraocular movements intact.  ?   Conjunctiva/sclera: Conjunctivae normal.  ?Cardiovascular:  ?   Rate and Rhythm: Tachycardia present. Rhythm irregular.  ?   Pulses: Normal pulses.  ?Pulmonary:  ?   Effort: Pulmonary effort is normal. No respiratory distress.  ?   Breath sounds: Normal breath sounds. No wheezing or rales.  ?Abdominal:  ?   General: There is no distension.  ?   Tenderness: There is no abdominal tenderness.  ?Musculoskeletal:     ?   General: Normal range of motion.  ?   Cervical back: Normal range of motion.  ?   Right lower leg: No edema.  ?   Left lower leg: No edema.  ?Skin: ?   General: Skin is warm and dry.  ?Neurological:  ?   General: No focal deficit present.  ?   Mental Status: She is alert. Mental status is at baseline.  ?   Comments: Sensation to the face is symmetrical.  Tongue is midline.  Without facial droop.  Without dysarthria.  Full range of motion on left upper and lower extremities.  Strength in left upper and lower extremities is intact is 5/5 and flexor and extensor muscle groups.  ? ? ?ED Results / Procedures / Treatments   ?Labs ?(all labs ordered are listed, but only abnormal results are displayed) ?Labs Reviewed - No data to display ? ?EKG ?EKG Interpretation ? ?Date/Time:  Monday November 26 2021 09:10:05 EDT ?Ventricular Rate:  107 ?PR Interval:    ?QRS Duration: 98 ?QT Interval:  383 ?QTC Calculation: 511 ?R Axis:   32 ?Text Interpretation: Atrial fibrillation Low voltage, precordial leads Anteroseptal infarct, old Prolonged QT interval No significant change since last  tracing Confirmed by [name] [id number]) on 11/26/2021 9:33:46 AM ? ?Radiology ?No results found. ? ?Procedures ?Procedures  ? ? ?Medications Ordered in ED ?Medications  ?prochlorperazine (COMPAZINE) injection 10 mg (has no administration in time range)  ?sodium chloride 0.9 % bolus 500 mL (has no administration in time range)  ? ? ?ED Course/ Medical Decision Making/ A&P ?Clinical Course as of 11/26/21 1333  ?Mon Nov 26, 2021  ?1122 Patient reports significant improvement in her headache.  She does remain mildly tachycardic at about 110.  CBC without leukocytosis.  Hemoglobin 8.8.  This is slightly lower than her baseline of about 10.  No signs of  active bleeding.  BMP without acute concerns.  Creatinine 1.59 which is improved from her baseline.  COVID and flu were negative.  CT head without acute findings.  Will complete remainder of the fluids and reevaluate. [AA]  ?1230 Patient heart rate elevated to about 130s.  Also complaining of mild headache.  Will provide 5 mg IV Lopressor and Tylenol. [AA]  ?1333 Heart rate improved following IV Lopressor down to about 110.  Reports significant improvement in headache.  Patient and daughter both feel safe and in agreement for discharge.  They will take their home medications after discharge and schedule a follow-up with their PCP. [AA]  ?  ?Clinical Course User Index ?Maudie Mercury, PA-C  ? ?                        ?Medical Decision Making ?Amount and/or Complexity of Data Reviewed ?Labs: ordered. ?Radiology: ordered. ? ?Risk ?OTC drugs. ?Prescription drug management. ? ? ?Medical Decision Making / ED Course ? ? ?This patient presents to the ED for concern of headache, this involves an extensive number of treatment options, and is a complaint that carries with it a high risk of complications and morbidity.  The differential diagnosis includes CVA, ICH, migraine, tension headache ? ?MDM: ?56 year old female with past medical history significant for CHF, A-fib on  Eliquis, CVA with right-sided deficits presents today for evaluation of headache since last night.  There was mention of numbness to the right side of the face, but despite asking patient multiple times she denies th

## 2021-11-26 NOTE — ED Provider Notes (Signed)
?[facility] ?Provider Note ? ? ?CSN: [id number] ?Arrival date & time: 11/26/21  2154 ? ?  ? ?History ? ?Chief Complaint  ?Patient presents with  ? Weakness  ? ? ?Robin [name] is a 56 y.o. female. ? ? ?Weakness ?Patient presenting for altered mental status.  EMS reports that on scene, she was able to ambulate to stretcher.  On arrival, she is able to provide very limited history.  She is disoriented.  She has a history of stroke with residual right-sided deficits.  She was seen in the emergency department earlier today for headache.  Per chart review, that headache began at 730 last night.  In the ED this morning, she was treated with Compazine and IV fluids.  She had some tachycardia and was given IV metoprolol.  She was discharged after improved headache symptoms. ? ?History per daughter: At approximately 8 PM, patient became diaphoretic and short of breath.  For this reason, EMS was called. ?  ? ?Home Medications ?Prior to Admission medications   ?Medication Sig Start Date End Date Taking? Authorizing Provider  ?Accu-Chek Softclix Lancets lancets Use as instructed 06/13/21   Shamleffer, Melanie Crazier, MD  ?aspirin EC 81 MG tablet Take 1 tablet (81 mg total) by mouth daily. Swallow whole. 07/19/20   [name], PA-C  ?BD PEN NEEDLE NANO U/F 32G X 4 MM MISC Inject 1 Device into the skin in the morning, at noon, in the evening, and at bedtime. 06/13/21   Shamleffer, Melanie Crazier, MD  ?Blood Glucose Monitoring Suppl (ACCU-CHEK GUIDE ME) w/Device KIT 1 Device by Does not apply route 3 (three) times daily. 03/09/21   Shamleffer, Melanie Crazier, MD  ?carvedilol (COREG) 25 MG tablet TAKE 1 TABLET BY MOUTH TWICE DAILY WITH A MEAL ?Patient taking differently: Take 25 mg by mouth 2 (two) times daily with a meal. 08/24/21   Inda Coke, [place]  ?Dulaglutide (TRULICITY) 1.5 EH/6.3JS SOPN Inject 1.5 mg into the skin once a week. 10/24/21   Shamleffer, Melanie Crazier, MD  ?ELIQUIS 5 MG TABS  tablet Take 1 tablet by mouth twice daily ?Patient taking differently: Take 5 mg by mouth 2 (two) times daily. 08/24/21   Inda Coke, PA  ?furosemide (LASIX) 20 MG tablet Take 20 mg daily. May take additional 20 mg if weight gain is over 2 lbs overnight, or 5 lbs in 1 week 08/14/21   Ahmed Prima, Fransisco Hertz, PA-C  ?gabapentin (NEURONTIN) 100 MG capsule TAKE 2 CAPSULES BY MOUTH THREE TIMES DAILY ?Patient taking differently: Take 200 mg by mouth 3 (three) times daily. 11/16/21   Inda Coke, PA  ?glucose blood (ACCU-CHEK GUIDE) test strip 1 each by Other route 3 (three) times daily. Use as instructed 06/13/21   Shamleffer, Melanie Crazier, MD  ?hydrALAZINE (APRESOLINE) 25 MG tablet TAKE 1 TABLET BY MOUTH THREE TIMES DAILY ?Patient taking differently: Take 25 mg by mouth 3 (three) times daily. 08/24/21   Inda Coke, PA  ?insulin aspart (NOVOLOG FLEXPEN) 100 UNIT/ML FlexPen Inject 12 Units into the skin 3 (three) times daily with meals. 06/13/21   Shamleffer, Melanie Crazier, MD  ?insulin glargine (LANTUS SOLOSTAR) 100 UNIT/ML Solostar Pen Inject 40 Units into the skin daily. 10/24/21   Shamleffer, Melanie Crazier, MD  ?isosorbide mononitrate (IMDUR) 30 MG 24 hr tablet Take 2 tablets by mouth once daily ?Patient taking differently: Take 60 mg by mouth daily. 08/24/21   Inda Coke, PA  ?metoCLOPramide (REGLAN) 10 MG tablet Take 1 tablet (10 mg total)  by mouth every 6 (six) hours as needed for nausea. 07/11/21   Evalee Jefferson, PA-C  ?nitroGLYCERIN (NITROSTAT) 0.4 MG SL tablet Place 1 tablet (0.4 mg total) under the tongue every 5 (five) minutes as needed for chest pain. 02/01/20   Angiulli, Lavon Paganini, PA-C  ?pantoprazole (PROTONIX) 40 MG tablet Take 1 tablet (40 mg total) by mouth daily. 10/08/21   Inda Coke, [place]  ?   ? ?Allergies    ?Bee venom, Losartan, Naproxen, Penicillins, and Lisinopril   ? ?Review of Systems   ?Review of Systems  ?Unable to perform ROS: Mental status change  ?Neurological:  Positive for  weakness.  ? ?Physical Exam ?Updated Vital Signs ?BP (!) 136/95   Pulse 98   Temp (!) 97.5 ?F (36.4 ?C) (Oral)   Resp 18   Ht 5' 5" (1.651 m)   Wt 105.2 kg   LMP 08/21/2011   SpO2 97%   BMI 38.59 kg/m?  ?Physical Exam ?Vitals and nursing note reviewed.  ?Constitutional:   ?   General: She is not in acute distress. ?   Appearance: She is well-developed. She is obese. She is ill-appearing. She is not toxic-appearing or diaphoretic.  ?HENT:  ?   Head: Normocephalic and atraumatic.  ?   Right Ear: External ear normal.  ?   Left Ear: External ear normal.  ?   Nose: Nose normal.  ?   Mouth/Throat:  ?   Mouth: Mucous membranes are moist.  ?   Pharynx: Oropharynx is clear.  ?Eyes:  ?   General: No scleral icterus. ?   Conjunctiva/sclera: Conjunctivae normal.  ?   Comments: Diminished reactivity to left pupil  ?Cardiovascular:  ?   Rate and Rhythm: Tachycardia present. Rhythm irregular.  ?Pulmonary:  ?   Effort: Tachypnea present.  ?   Breath sounds: No wheezing or rhonchi.  ?Abdominal:  ?   Palpations: Abdomen is soft.  ?   Tenderness: There is no abdominal tenderness.  ?Musculoskeletal:     ?   General: No tenderness or deformity.  ?   Cervical back: Normal range of motion and neck supple. No rigidity or tenderness.  ?Skin: ?   General: Skin is warm and dry.  ?   Capillary Refill: Capillary refill takes less than 2 seconds.  ?   Coloration: Skin is not jaundiced or pale.  ?Neurological:  ?   Mental Status: She is alert. She is disoriented.  ?   Motor: Weakness (Right hemibody, reportedly consistent with baseline) present.  ?Psychiatric:     ?   Mood and Affect: Mood is anxious.     ?   Behavior: Behavior is slowed.  ? ? ?ED Results / Procedures / Treatments   ?Labs ?(all labs ordered are listed, but only abnormal results are displayed) ?Labs Reviewed  ?COMPREHENSIVE METABOLIC PANEL - Abnormal; Notable for the following components:  ?    Result Value  ? CO2 17 (*)   ? Glucose, Bld 297 (*)   ? BUN 23 (*)   ?  Creatinine, Ser 1.62 (*)   ? AST 44 (*)   ? GFR, Estimated 37 (*)   ? All other components within normal limits  ?CBC WITH DIFFERENTIAL/PLATELET - Abnormal; Notable for the following components:  ? Hemoglobin 9.7 (*)   ? HCT 32.4 (*)   ? MCV 75.7 (*)   ? MCH 22.7 (*)   ? MCHC 29.9 (*)   ? All other components within normal limits  ?URINALYSIS,  ROUTINE W REFLEX MICROSCOPIC - Abnormal; Notable for the following components:  ? Glucose, UA >=500 (*)   ? Ketones, ur 20 (*)   ? Protein, ur 100 (*)   ? Bacteria, UA RARE (*)   ? All other components within normal limits  ?LACTIC ACID, PLASMA - Abnormal; Notable for the following components:  ? Lactic Acid, Venous 3.1 (*)   ? All other components within normal limits  ?BLOOD GAS, VENOUS - Abnormal; Notable for the following components:  ? pCO2, Ven 30 (*)   ? pO2, Ven 31 (*)   ? Bicarbonate 18.3 (*)   ? Acid-base deficit 5.7 (*)   ? All other components within normal limits  ?PROTIME-INR - Abnormal; Notable for the following components:  ? Prothrombin Time 16.6 (*)   ? INR 1.4 (*)   ? All other components within normal limits  ?BRAIN NATRIURETIC PEPTIDE - Abnormal; Notable for the following components:  ? B Natriuretic Peptide 1,106.0 (*)   ? All other components within normal limits  ?LACTIC ACID, PLASMA - Abnormal; Notable for the following components:  ? Lactic Acid, Venous 3.6 (*)   ? All other components within normal limits  ?BASIC METABOLIC PANEL - Abnormal; Notable for the following components:  ? CO2 18 (*)   ? Glucose, Bld 335 (*)   ? BUN 26 (*)   ? Creatinine, Ser 1.81 (*)   ? GFR, Estimated 33 (*)   ? All other components within normal limits  ?HEPATIC FUNCTION PANEL - Abnormal; Notable for the following components:  ? AST 43 (*)   ? Bilirubin, Direct 0.4 (*)   ? All other components within normal limits  ?CBC - Abnormal; Notable for the following components:  ? WBC 10.6 (*)   ? Hemoglobin 9.6 (*)   ? HCT 31.7 (*)   ? MCV 75.5 (*)   ? MCH 22.9 (*)   ? All other  components within normal limits  ?GLUCOSE, CAPILLARY - Abnormal; Notable for the following components:  ? Glucose-Capillary 292 (*)   ? All other components within normal limits  ?GLUCOSE, CAPILLARY - Abnormal; Notable for

## 2021-11-26 NOTE — ED Triage Notes (Signed)
Patient arrived via EMS with complaints of increased weakness, SOB, and pain in right arm and headache. Hx of stroke with right sided deficits.  ?

## 2021-11-27 ENCOUNTER — Encounter (HOSPITAL_COMMUNITY): Payer: Self-pay | Admitting: Family Medicine

## 2021-11-27 ENCOUNTER — Other Ambulatory Visit (HOSPITAL_COMMUNITY): Payer: Self-pay | Admitting: *Deleted

## 2021-11-27 ENCOUNTER — Inpatient Hospital Stay (HOSPITAL_COMMUNITY): Payer: Medicaid Other

## 2021-11-27 DIAGNOSIS — D509 Iron deficiency anemia, unspecified: Secondary | ICD-10-CM | POA: Diagnosis present

## 2021-11-27 DIAGNOSIS — I5023 Acute on chronic systolic (congestive) heart failure: Secondary | ICD-10-CM | POA: Diagnosis present

## 2021-11-27 DIAGNOSIS — E1165 Type 2 diabetes mellitus with hyperglycemia: Secondary | ICD-10-CM | POA: Diagnosis present

## 2021-11-27 DIAGNOSIS — E872 Acidosis, unspecified: Secondary | ICD-10-CM | POA: Diagnosis present

## 2021-11-27 DIAGNOSIS — E1122 Type 2 diabetes mellitus with diabetic chronic kidney disease: Secondary | ICD-10-CM | POA: Diagnosis present

## 2021-11-27 DIAGNOSIS — I4891 Unspecified atrial fibrillation: Secondary | ICD-10-CM

## 2021-11-27 DIAGNOSIS — I4819 Other persistent atrial fibrillation: Secondary | ICD-10-CM | POA: Diagnosis present

## 2021-11-27 DIAGNOSIS — E1143 Type 2 diabetes mellitus with diabetic autonomic (poly)neuropathy: Secondary | ICD-10-CM | POA: Diagnosis present

## 2021-11-27 DIAGNOSIS — F1410 Cocaine abuse, uncomplicated: Secondary | ICD-10-CM | POA: Diagnosis present

## 2021-11-27 DIAGNOSIS — I428 Other cardiomyopathies: Secondary | ICD-10-CM | POA: Diagnosis present

## 2021-11-27 DIAGNOSIS — Z811 Family history of alcohol abuse and dependence: Secondary | ICD-10-CM | POA: Diagnosis not present

## 2021-11-27 DIAGNOSIS — I130 Hypertensive heart and chronic kidney disease with heart failure and stage 1 through stage 4 chronic kidney disease, or unspecified chronic kidney disease: Secondary | ICD-10-CM | POA: Diagnosis present

## 2021-11-27 DIAGNOSIS — J9601 Acute respiratory failure with hypoxia: Secondary | ICD-10-CM | POA: Diagnosis present

## 2021-11-27 DIAGNOSIS — N1832 Chronic kidney disease, stage 3b: Secondary | ICD-10-CM | POA: Diagnosis not present

## 2021-11-27 DIAGNOSIS — I5020 Unspecified systolic (congestive) heart failure: Secondary | ICD-10-CM | POA: Diagnosis not present

## 2021-11-27 DIAGNOSIS — Z794 Long term (current) use of insulin: Secondary | ICD-10-CM

## 2021-11-27 DIAGNOSIS — Z6838 Body mass index (BMI) 38.0-38.9, adult: Secondary | ICD-10-CM | POA: Diagnosis not present

## 2021-11-27 DIAGNOSIS — I69351 Hemiplegia and hemiparesis following cerebral infarction affecting right dominant side: Secondary | ICD-10-CM | POA: Diagnosis not present

## 2021-11-27 DIAGNOSIS — I5021 Acute systolic (congestive) heart failure: Secondary | ICD-10-CM | POA: Diagnosis not present

## 2021-11-27 DIAGNOSIS — Z87891 Personal history of nicotine dependence: Secondary | ICD-10-CM | POA: Diagnosis not present

## 2021-11-27 DIAGNOSIS — E1121 Type 2 diabetes mellitus with diabetic nephropathy: Secondary | ICD-10-CM | POA: Diagnosis present

## 2021-11-27 DIAGNOSIS — I6930 Unspecified sequelae of cerebral infarction: Secondary | ICD-10-CM | POA: Diagnosis not present

## 2021-11-27 DIAGNOSIS — Z833 Family history of diabetes mellitus: Secondary | ICD-10-CM | POA: Diagnosis not present

## 2021-11-27 DIAGNOSIS — I10 Essential (primary) hypertension: Secondary | ICD-10-CM

## 2021-11-27 DIAGNOSIS — N179 Acute kidney failure, unspecified: Secondary | ICD-10-CM | POA: Diagnosis present

## 2021-11-27 DIAGNOSIS — Z20822 Contact with and (suspected) exposure to covid-19: Secondary | ICD-10-CM | POA: Diagnosis present

## 2021-11-27 DIAGNOSIS — I69320 Aphasia following cerebral infarction: Secondary | ICD-10-CM | POA: Diagnosis not present

## 2021-11-27 DIAGNOSIS — I429 Cardiomyopathy, unspecified: Secondary | ICD-10-CM | POA: Diagnosis present

## 2021-11-27 DIAGNOSIS — E876 Hypokalemia: Secondary | ICD-10-CM | POA: Diagnosis present

## 2021-11-27 DIAGNOSIS — I3139 Other pericardial effusion (noninflammatory): Secondary | ICD-10-CM | POA: Diagnosis present

## 2021-11-27 LAB — MRSA NEXT GEN BY PCR, NASAL: MRSA by PCR Next Gen: NOT DETECTED

## 2021-11-27 LAB — GLUCOSE, CAPILLARY
Glucose-Capillary: 292 mg/dL — ABNORMAL HIGH (ref 70–99)
Glucose-Capillary: 237 mg/dL — ABNORMAL HIGH (ref 70–99)
Glucose-Capillary: 229 mg/dL — ABNORMAL HIGH (ref 70–99)
Glucose-Capillary: 303 mg/dL — ABNORMAL HIGH (ref 70–99)

## 2021-11-27 LAB — HEPATIC FUNCTION PANEL
ALT: 31 U/L (ref 0–44)
AST: 43 U/L — ABNORMAL HIGH (ref 15–41)
Albumin: 4 g/dL (ref 3.5–5.0)
Alkaline Phosphatase: 104 U/L (ref 38–126)
Bilirubin, Direct: 0.4 mg/dL — ABNORMAL HIGH (ref 0.0–0.2)
Indirect Bilirubin: 0.6 mg/dL (ref 0.3–0.9)
Total Bilirubin: 1 mg/dL (ref 0.3–1.2)
Total Protein: 8.1 g/dL (ref 6.5–8.1)

## 2021-11-27 LAB — LACTIC ACID, PLASMA
Lactic Acid, Venous: 3.6 mmol/L (ref 0.5–1.9)
Lactic Acid, Venous: 1.7 mmol/L (ref 0.5–1.9)

## 2021-11-27 LAB — CBC
HCT: 31.7 % — ABNORMAL LOW (ref 36.0–46.0)
Hemoglobin: 9.6 g/dL — ABNORMAL LOW (ref 12.0–15.0)
MCH: 22.9 pg — ABNORMAL LOW (ref 26.0–34.0)
MCHC: 30.3 g/dL (ref 30.0–36.0)
MCV: 75.5 fL — ABNORMAL LOW (ref 80.0–100.0)
Platelets: 361 10*3/uL (ref 150–400)
RBC: 4.2 MIL/uL (ref 3.87–5.11)
RDW: 15.2 % (ref 11.5–15.5)
WBC: 10.6 10*3/uL — ABNORMAL HIGH (ref 4.0–10.5)
nRBC: 0 % (ref 0.0–0.2)

## 2021-11-27 LAB — BASIC METABOLIC PANEL
Anion gap: 11 (ref 5–15)
BUN: 26 mg/dL — ABNORMAL HIGH (ref 6–20)
CO2: 18 mmol/L — ABNORMAL LOW (ref 22–32)
Calcium: 9.3 mg/dL (ref 8.9–10.3)
Chloride: 109 mmol/L (ref 98–111)
Creatinine, Ser: 1.81 mg/dL — ABNORMAL HIGH (ref 0.44–1.00)
GFR, Estimated: 33 mL/min — ABNORMAL LOW (ref >60)
Glucose, Bld: 335 mg/dL — ABNORMAL HIGH (ref 70–99)
Potassium: 4.7 mmol/L (ref 3.5–5.1)
Sodium: 138 mmol/L (ref 135–145)

## 2021-11-27 LAB — ECHOCARDIOGRAM LIMITED
Height: 65 in
S' Lateral: 3.9 cm
Weight: 3710.78 oz

## 2021-11-27 LAB — TROPONIN I (HIGH SENSITIVITY): Troponin I (High Sensitivity): 13 ng/L (ref <18)

## 2021-11-27 LAB — MAGNESIUM: Magnesium: 2 mg/dL (ref 1.7–2.4)

## 2021-11-27 LAB — HIV ANTIBODY (ROUTINE TESTING W REFLEX): HIV Screen 4th Generation wRfx: NONREACTIVE

## 2021-11-27 MED ORDER — CARVEDILOL 12.5 MG PO TABS
25.0000 mg | ORAL_TABLET | Freq: Two times a day (BID) | ORAL | Status: DC
  Administered 2021-11-27 – 2021-11-28 (×3): 25 mg via ORAL
  Filled 2021-11-27 (×3): qty 2

## 2021-11-27 MED ORDER — INSULIN GLARGINE-YFGN 100 UNIT/ML ~~LOC~~ SOLN
40.0000 [IU] | Freq: Every day | SUBCUTANEOUS | Status: DC
  Administered 2021-11-27: 40 [IU] via SUBCUTANEOUS
  Filled 2021-11-27 (×2): qty 0.4

## 2021-11-27 MED ORDER — CARVEDILOL 12.5 MG PO TABS: 25.0000 mg | ORAL_TABLET | Freq: Two times a day (BID) | ORAL | Status: DC

## 2021-11-27 MED ORDER — METOPROLOL TARTRATE 25 MG PO TABS: 25.0000 mg | ORAL_TABLET | Freq: Four times a day (QID) | ORAL | Status: DC | PRN

## 2021-11-27 MED ORDER — PANTOPRAZOLE SODIUM 40 MG PO TBEC
40.0000 mg | DELAYED_RELEASE_TABLET | Freq: Every day | ORAL | Status: DC
Start: 2021-11-27 — End: 2021-12-01
  Administered 2021-11-27 – 2021-12-01 (×5): 40 mg via ORAL
  Filled 2021-11-27 (×5): qty 1

## 2021-11-27 MED ORDER — GABAPENTIN 100 MG PO CAPS
200.0000 mg | ORAL_CAPSULE | Freq: Three times a day (TID) | ORAL | Status: DC
  Administered 2021-11-27 – 2021-12-01 (×13): 200 mg via ORAL
  Filled 2021-11-27 (×13): qty 2

## 2021-11-27 MED ORDER — METOPROLOL TARTRATE 5 MG/5ML IV SOLN: 2.5000 mg | INTRAVENOUS | Status: DC | PRN

## 2021-11-27 MED ORDER — CHLORHEXIDINE GLUCONATE CLOTH 2 % EX PADS
6.0000 | MEDICATED_PAD | Freq: Every day | CUTANEOUS | Status: DC
  Administered 2021-11-27 – 2021-11-28 (×2): 6 via TOPICAL

## 2021-11-27 MED ORDER — APIXABAN 5 MG PO TABS
5.0000 mg | ORAL_TABLET | Freq: Two times a day (BID) | ORAL | Status: DC
  Administered 2021-11-27 – 2021-12-01 (×9): 5 mg via ORAL
  Filled 2021-11-27 (×9): qty 1

## 2021-11-27 MED ORDER — METOCLOPRAMIDE HCL 10 MG PO TABS
10.0000 mg | ORAL_TABLET | Freq: Four times a day (QID) | ORAL | Status: DC | PRN
  Administered 2021-11-28: 10 mg via ORAL
  Filled 2021-11-27: qty 1

## 2021-11-27 MED ORDER — PERFLUTREN LIPID MICROSPHERE
1.0000 mL | INTRAVENOUS | Status: AC | PRN
  Administered 2021-11-27: 3 mL via INTRAVENOUS
  Filled 2021-11-27: qty 10

## 2021-11-27 MED ORDER — FUROSEMIDE 10 MG/ML IJ SOLN
40.0000 mg | Freq: Two times a day (BID) | INTRAMUSCULAR | Status: DC
  Administered 2021-11-27 – 2021-11-28 (×3): 40 mg via INTRAVENOUS
  Filled 2021-11-27 (×3): qty 4

## 2021-11-27 MED ORDER — ISOSORBIDE MONONITRATE ER 60 MG PO TB24
60.0000 mg | ORAL_TABLET | Freq: Every day | ORAL | Status: DC
  Administered 2021-11-27 – 2021-12-01 (×5): 60 mg via ORAL
  Filled 2021-11-27 (×5): qty 1

## 2021-11-27 MED ORDER — INSULIN GLARGINE-YFGN 100 UNIT/ML ~~LOC~~ SOLN
50.0000 [IU] | Freq: Every day | SUBCUTANEOUS | Status: DC
  Administered 2021-11-28 – 2021-12-01 (×4): 50 [IU] via SUBCUTANEOUS
  Filled 2021-11-27 (×5): qty 0.5

## 2021-11-27 MED ORDER — INSULIN ASPART 100 UNIT/ML IJ SOLN
0.0000 [IU] | Freq: Three times a day (TID) | INTRAMUSCULAR | Status: DC
  Administered 2021-11-27 (×2): 3 [IU] via SUBCUTANEOUS
  Administered 2021-11-28: 2 [IU] via SUBCUTANEOUS
  Administered 2021-11-28 (×2): 5 [IU] via SUBCUTANEOUS
  Administered 2021-11-29: 3 [IU] via SUBCUTANEOUS
  Administered 2021-11-29 (×2): 5 [IU] via SUBCUTANEOUS
  Administered 2021-11-30: 2 [IU] via SUBCUTANEOUS
  Administered 2021-11-30: 5 [IU] via SUBCUTANEOUS
  Administered 2021-11-30: 3 [IU] via SUBCUTANEOUS
  Administered 2021-12-01: 2 [IU] via SUBCUTANEOUS

## 2021-11-27 MED ORDER — HYDRALAZINE HCL 25 MG PO TABS
25.0000 mg | ORAL_TABLET | Freq: Three times a day (TID) | ORAL | Status: DC
Start: 2021-11-27 — End: 2021-11-27
  Administered 2021-11-27: 25 mg via ORAL
  Filled 2021-11-27: qty 1

## 2021-11-27 MED ORDER — HYDRALAZINE HCL 25 MG PO TABS
50.0000 mg | ORAL_TABLET | Freq: Three times a day (TID) | ORAL | Status: DC
  Administered 2021-11-27 – 2021-11-28 (×3): 50 mg via ORAL
  Filled 2021-11-27 (×3): qty 2

## 2021-11-27 MED ORDER — HYDRALAZINE HCL 25 MG PO TABS
25.0000 mg | ORAL_TABLET | Freq: Once | ORAL | Status: AC
  Administered 2021-11-27: 25 mg via ORAL
  Filled 2021-11-27: qty 1

## 2021-11-27 MED ORDER — INSULIN ASPART 100 UNIT/ML IJ SOLN
0.0000 [IU] | Freq: Every day | INTRAMUSCULAR | Status: DC
  Administered 2021-11-27: 4 [IU] via SUBCUTANEOUS
  Administered 2021-11-28: 3 [IU] via SUBCUTANEOUS
  Administered 2021-11-29 – 2021-11-30 (×2): 2 [IU] via SUBCUTANEOUS

## 2021-11-27 MED ORDER — ACETAMINOPHEN 325 MG PO TABS
650.0000 mg | ORAL_TABLET | Freq: Four times a day (QID) | ORAL | Status: DC | PRN
  Administered 2021-11-28 – 2021-11-30 (×5): 650 mg via ORAL
  Filled 2021-11-27 (×5): qty 2

## 2021-11-27 MED ORDER — SODIUM CHLORIDE 0.9% FLUSH
3.0000 mL | Freq: Two times a day (BID) | INTRAVENOUS | Status: DC
  Administered 2021-11-27 – 2021-12-01 (×10): 3 mL via INTRAVENOUS

## 2021-11-27 MED ORDER — ACETAMINOPHEN 650 MG RE SUPP: 650.0000 mg | Freq: Four times a day (QID) | RECTAL | Status: DC | PRN

## 2021-11-27 NOTE — Consult Note (Addendum)
?Cardiology Consultation:  ? ?Patient ID: Robin [name] ?MRN: [id number]; DOB: [birth year] ? ?Admit date: 11/26/2021 ?Date of Consult: 11/27/2021 ? ?PCP:  Robin Coke, PA ?  ?[facility] Providers ?Cardiologist:  Robin Dolly, MD      ? ? ?Patient Profile:  ? ?Robin [name] is a 56 y.o. female with a hx of HFimpEF (EF at 13 to 35% by echo in 08/2018 with similar results by echo in 05/2019 and in the setting of substance abuse and atrial fibrillation, EF improved to 65-70% by echo in 04/2020, at 50-55% by echo in 07/2020), persistent atrial fibrillation (diagnosed in 04/2019 and rate-control pursued given substance use), HTN, IDDM, COPD, thoracic aortic aneurysm, normal coronaries by catheterization in 2010, prior CVA and history of substance abuse who is being seen 11/27/2021 for the evaluation of CHF and atrial fibrillation at the request of Dr. Myna [name]. ? ?History of Present Illness:  ? ?Robin [name] was last examined by myself in 07/2021 and reported intermittent dyspnea on exertion over the past few months but history was limited given memory loss from her prior CVA. Family did report a 30 lb weight gain within the past year given her increased appetite. A repeat echo was recommended and Lasix was titrated to 90m daily. She was continued on Coreg 27mBID, Hydralazine 2574mID and Imdur 44m35mily as she had previously not been on ACE-I/ARB/ARNI/SGLT2 inhibitor given her variable renal function. Follow-up labs showed her creatinine had trended up to 2.22, therefore Lasix was reduced back to 20mg32mly and to take an extra 20mg 43meeded. Repeat echo showed her EF was at 40-45% but she denied any recurrent chest pain. Was recommended to proceed with a Lexiscan if any recurrent symptoms.  ? ?She presented to [name] 11/26/2021 for evaluation of headache and right-sided facial numbness. Head CT showed no acute intracranial abnormalities. She was discharged home but presented back later that evening  for evaluation of worsening weakness, dyspnea and pain in her right arm. History is limited at this time but when asked about recent events, she is able to say she was having shortness of breath and lower extremity edema prior to admission. No specific orthopnea or PND. Reports having chest pain several weeks ago that resolved. No current pain or palpitations.   ? ?Labs showed WBC 8.8, Hgb 9.7, platelets 333, Na+ 138, K+ 4.7 and creatinine 1.62 (baseline variable but previously 1.7 - 2.2 earlier this year). AST 44 and ALT 27. Lactic Acid 3.1 with repeat of 3.6 and 1.7. BNP 1106. Initial and repeat Hs Troponin negative. UDS negative. CXR showed mild pulmonary edema and bilateral hilar prominence which may be secondary to edema but repeat CXR recommended in 4-6 weeks. Repeat CT Head showed no acute changes. EKG showed atrial fibrillation, HR 107 with no acute ST changes.  ? ?She has been started on IV Lasix 40mg B39mith a recorded output of -1 L thus far. PTA Coreg ordered but not listed to be administered until 1700 (will adjust timing). ? ? ? ?Past Medical History:  ?Diagnosis Date  ? A-fib (HCC)   [place]   [place]   ? Anemia   ? Arthritis   ? Atrial fibrillation (HCC)   [place]   [place]. EF at 30-35% by echo in 08/2018 b. EF at 35% by repeat echo in 05/2019 c. EF improved to 65-70% in 2021 b. EF at 40-45% in 08/2021.  ?  Chronic abdominal pain   ? Chronic atrial fibrillation (HCC)   ? Cocaine abuse ([place])   ? COPD (chronic obstructive pulmonary disease) ([place])   ? Diabetes mellitus without complication ([place])   ? Essential hypertension, benign   ? Expressive aphasia   ? Expressive aphasia   ? post CVA  ? Fatty liver   ? GERD (gastroesophageal reflux disease)   ? Gout 2016  ? Normal coronary arteries   ? 3/10 - following abnormal Myoview  ? Ovarian cyst   ? Stroke [facility]) 12/26/2019  ? Right sided weakness, and expressive aphasia  ? Stroke [facility])   ? Thoracic  ascending aortic aneurysm ([place])   ? 4 cm 10/31/19 CTA  ? Type 2 diabetes mellitus ([place])   ? type II  ? ? ?Past Surgical History:  ?Procedure Laterality Date  ? ABDOMINAL HYSTERECTOMY  09/10/2011  ? Procedure: HYSTERECTOMY ABDOMINAL;  Surgeon: Jonnie Kind, MD;  Location: [facility];  Service: Gynecology;  Laterality: N/A;  Abdominal hysterectomy  ? CESAREAN SECTION  [id number], and 1994  ? CHOLECYSTECTOMY  1995  ? IR 3D INDEPENDENT WKST  03/16/2020  ? IR ANGIO INTRA EXTRACRAN SEL INTERNAL CAROTID BILAT MOD SED  03/16/2020  ? IR ANGIO VERTEBRAL SEL SUBCLAVIAN INNOMINATE UNI L MOD SED  03/16/2020  ? IR ANGIO VERTEBRAL SEL VERTEBRAL UNI R MOD SED  03/16/2020  ? IR CT HEAD LTD  12/27/2019  ? IR PERCUTANEOUS ART THROMBECTOMY/INFUSION INTRACRANIAL INC DIAG ANGIO  12/27/2019  ? IR US GUIDE VASC ACCESS RIGHT  12/27/2019  ? IR US GUIDE VASC ACCESS RIGHT  03/16/2020  ? RADIOLOGY WITH ANESTHESIA N/A 12/27/2019  ? Procedure: IR WITH ANESTHESIA;  Surgeon: Luanne Bras, MD;  Location: [facility];  Service: Radiology;  Laterality: N/A;  ? SCAR REVISION  09/10/2011  ? Procedure: SCAR REVISION;  Surgeon: Jonnie Kind, MD;  Location: [facility];  Service: Gynecology;  Laterality: N/A;  Wide Excision of old Cicatrix  ? TUBAL LIGATION  1994  ?  ? ?Home Medications:  ?Prior to Admission medications   ?Medication Sig Start Date End Date Taking? Authorizing Provider  ?Accu-Chek Softclix Lancets lancets Use as instructed 06/13/21   Shamleffer, Melanie Crazier, MD  ?aspirin EC 81 MG tablet Take 1 tablet (81 mg total) by mouth daily. Swallow whole. 07/19/20   [name], PA-C  ?BD PEN NEEDLE NANO U/F 32G X 4 MM MISC Inject 1 Device into the skin in the morning, at noon, in the evening, and at bedtime. 06/13/21   Shamleffer, Melanie Crazier, MD  ?Blood Glucose Monitoring Suppl (ACCU-CHEK GUIDE ME) w/Device KIT 1 Device by Does not apply route 3 (three) times daily. 03/09/21   Shamleffer, Melanie Crazier, MD  ?carvedilol (COREG) 25 MG tablet TAKE 1  TABLET BY MOUTH TWICE DAILY WITH A MEAL 08/24/21   Robin Coke, PA  ?Dulaglutide (TRULICITY) 1.5 IR/4.8NI SOPN Inject 1.5 mg into the skin once a week. 10/24/21   Shamleffer, Melanie Crazier, MD  ?Arne [place] 5 MG TABS tablet Take 1 tablet by mouth twice daily 08/24/21   Robin Coke, PA  ?furosemide (LASIX) 20 MG tablet Take 20 mg daily. May take additional 20 mg if weight gain is over 2 lbs overnight, or 5 lbs in 1 week 08/14/21   Ahmed Prima, Fransisco Hertz, PA-C  ?gabapentin (NEURONTIN) 100 MG capsule TAKE 2 CAPSULES BY MOUTH THREE TIMES DAILY 11/16/21   Robin Coke, PA  ?glucose blood (ACCU-CHEK GUIDE) test strip 1 each by Other route 3 (three)  times daily. Use as instructed 06/13/21   Shamleffer, Melanie Crazier, MD  ?hydrALAZINE (APRESOLINE) 25 MG tablet TAKE 1 TABLET BY MOUTH THREE TIMES DAILY 08/24/21   Robin Coke, PA  ?insulin aspart (NOVOLOG FLEXPEN) 100 UNIT/ML FlexPen Inject 12 Units into the skin 3 (three) times daily with meals. 06/13/21   Shamleffer, Melanie Crazier, MD  ?insulin glargine (LANTUS SOLOSTAR) 100 UNIT/ML Solostar Pen Inject 40 Units into the skin daily. 10/24/21   Shamleffer, Melanie Crazier, MD  ?isosorbide mononitrate (IMDUR) 30 MG 24 hr tablet Take 2 tablets by mouth once daily 08/24/21   Robin Coke, PA  ?metoCLOPramide (REGLAN) 10 MG tablet Take 1 tablet (10 mg total) by mouth every 6 (six) hours as needed for nausea. 07/11/21   Evalee Jefferson, PA-C  ?nitroGLYCERIN (NITROSTAT) 0.4 MG SL tablet Place 1 tablet (0.4 mg total) under the tongue every 5 (five) minutes as needed for chest pain. 02/01/20   Angiulli, Lavon Paganini, PA-C  ?pantoprazole (PROTONIX) 40 MG tablet Take 1 tablet (40 mg total) by mouth daily. 10/08/21   Robin [name], [place]  ? ? ?Inpatient Medications: ?Scheduled Meds: ? apixaban  5 mg Oral BID  ? carvedilol  25 mg Oral BID WC  ? Chlorhexidine Gluconate Cloth  6 each Topical Q0600  ? furosemide  40 mg Intravenous Q12H  ? gabapentin  200 mg Oral TID  ? hydrALAZINE  25 mg  Oral TID  ? insulin aspart  0-5 Units Subcutaneous QHS  ? insulin aspart  0-9 Units Subcutaneous TID WC  ? insulin glargine-yfgn  40 Units Subcutaneous Daily  ? isosorbide mononitrate  60 mg Oral Daily  ? panto

## 2021-11-27 NOTE — ED Notes (Signed)
Pts sister given update on pts status.  ?

## 2021-11-27 NOTE — Progress Notes (Addendum)
*  PRELIMINARY RESULTS* ?Echocardiogram ?Limited 2-D Echocardiogram has been performed with Definity. ? ?Robin [name] ?11/27/2021, 11:15 AM ?

## 2021-11-27 NOTE — Progress Notes (Signed)
?PROGRESS NOTE ? ? ? ?Robin [name]  SKA:[id number] DOB: [birth year] DOA: 11/26/2021 ?PCP: Inda Coke, PA ? ? ?Brief Narrative:  ?Robin [name] is a pleasant 56 y.o. female with medical history significant for atrial fibrillation on Eliquis, chronic systolic CHF, CKD 3B, insulin-dependent diabetes mellitus, and history of CVA with residual right-sided weakness and aphasia, now presenting to the emergency department with shortness of breath.  Patient is well-known to the cardiac team here, admitted for acute recurrent systolic heart failure exacerbation and hypoxia.  Initially requiring BiPAP for respiratory support but improving drastically since admission. ? ?Assessment & Plan: ?  ?Principal Problem: ?  Acute on chronic systolic CHF (congestive heart failure) ([place]) ?Active Problems: ?  Type 2 diabetes mellitus with diabetic autonomic neuropathy, with long-term current use of insulin ([place]) ?  Atrial fibrillation with RVR ([place]) ?  History of CVA with residual deficit ?  Stage 3b chronic kidney disease (CKD) ([place]) ?  Microcytic anemia ?  Lactic acidosis ? ?Acute on chronic systolic CHF; acute hypoxic respiratory failure, POA ?-Cardiology following, appreciate insight and recommendations ?-Continue aggressive diuresis ?-Respiratory status markedly improving, no longer BiPAP dependent, able to wean to room air today at rest ?- EF was 40-45% in January  ?  ?Atrial fibrillation with RVR  ?-Likely provoked secondary to above - rate 140s in ED  ?-Continue Lopressor, Eliquis   ?  ?Lactic acidosis, resolved ?-Within normal limits, likely secondary to profound volume overload and hypoxia ?-No fever or evidence for infection, she is not hypotensive and extremities are warm ?-Likely from acute pulmonary edema  ?-Trend while diuresing  ?  ?Hx of CVA  ?- Residual hemiparesis and aphasia -has stuttering speech, this is her baseline per family at bedside -can answer yes/no questions quite readily however ?- Continue Eliquis    ?  ?AKI on CKD IIIb  ?-Baseline creatinine around 1.6 currently 1.8 in the setting of volume overload and diuresis ?  ?IDDM, profoundly uncontrolled with hyperglycemia- A1c was 12.3% in March 2023  ?-Continue sliding scale insulin, hypoglycemic protocol  ?  ?DVT prophylaxis: Eliquis  ?Code Status: Full  ?Family Communication: Son at bedside ? ?Status is: Inpatient ? ?Dispo: The patient is from: Home ?             Anticipated d/c is to: To be determined ?             Anticipated d/c date is: 72+ hours ?             Patient currently not medically stable for discharge ? ?Consultants:  ?Cardiology ? ?Procedures:  ?None ? ?Antimicrobials:  ?None indicated ? ?Subjective: ?No acute issues or events overnight able to wean off BiPAP, respiratory status markedly improving, patient denies other nausea vomiting diarrhea constipation headache fevers chills or chest pain, shortness of breath improving but not yet resolved. ? ?Objective: ?Vitals:  ? 11/27/21 0530 11/27/21 0630 11/27/21 0700 11/27/21 0730  ?BP: (!) 151/80 (!) 140/96 (!) 126/92   ?Pulse: (!) 112  (!) 112   ?Resp: (!) 21  20   ?Temp:   98.1 ?F (36.7 ?C)   ?TempSrc:   Oral   ?SpO2: 97%  97%   ?Weight:    105.2 kg  ?Height:    '5\' 5"'$  (1.651 m)  ? ? ?Intake/Output Summary (Last 24 hours) at 11/27/2021 0736 ?Last data filed at 11/27/2021 0546 ?Gross per 24 hour  ?Intake --  ?Output 1000 ml  ?Net -1000 ml  ? ?Filed Weights  ?  11/26/21 2157 11/27/21 0730  ?Weight: 106.6 kg 105.2 kg  ? ? ?Examination: ? ?General:  Pleasantly resting in bed, No acute distress. ?HEENT:  Normocephalic atraumatic.  Sclerae nonicteric, noninjected.  Extraocular movements intact bilaterally.  Speech markedly limited with stutter difficulty word finding but can answer yes no without difficulty ?Lungs: Diffuse rales ?Heart: Irregularly irregular, tachycardic. ?Abdomen:  Soft, nontender, nondistended.  Without guarding or rebound. ?Extremities: 2+ pitting edema bilateral lower extremities. ? ?Data  Reviewed: I have personally reviewed following labs and imaging studies ? ?CBC: ?Recent Labs  ?Lab 11/26/21 ?7829 11/26/21 ?2203 11/27/21 ?0424  ?WBC 7.4 8.8 10.6*  ?NEUTROABS 4.5 6.6  --   ?HGB 8.8* 9.7* 9.6*  ?HCT 30.1* 32.4* 31.7*  ?MCV 75.4* 75.7* 75.5*  ?PLT 327 333 361  ? ?Basic Metabolic Panel: ?Recent Labs  ?Lab 11/26/21 ?5621 11/26/21 ?2203 11/26/21 ?2244 11/27/21 ?0424  ?NA 137 138  --  138  ?K 4.0 4.7  --  4.7  ?CL 109 110  --  109  ?CO2 20* 17*  --  18*  ?GLUCOSE 225* 297*  --  335*  ?BUN 23* 23*  --  26*  ?CREATININE 1.59* 1.62*  --  1.81*  ?CALCIUM 8.9 9.1  --  9.3  ?MG  --   --  2.2 2.0  ? ?GFR: ?Estimated Creatinine Clearance: 42.3 mL/min (A) (by C-G formula based on SCr of 1.81 mg/dL (H)). ?Liver Function Tests: ?Recent Labs  ?Lab 11/26/21 ?2203 11/27/21 ?0424  ?AST 44* 43*  ?ALT 27 31  ?ALKPHOS 101 104  ?BILITOT 1.0 1.0  ?PROT 7.9 8.1  ?ALBUMIN 3.9 4.0  ? ?No results for input(s): LIPASE, AMYLASE in the last 168 hours. ?No results for input(s): AMMONIA in the last 168 hours. ?Coagulation Profile: ?Recent Labs  ?Lab 11/26/21 ?2203  ?INR 1.4*  ? ?Cardiac Enzymes: ?No results for input(s): CKTOTAL, CKMB, CKMBINDEX, TROPONINI in the last 168 hours. ?BNP (last 3 results) ?No results for input(s): PROBNP in the last 8760 hours. ?HbA1C: ?No results for input(s): HGBA1C in the last 72 hours. ?CBG: ?Recent Labs  ?Lab 11/26/21 ?2203  ?GLUCAP 281*  ? ?Lipid Profile: ?No results for input(s): CHOL, HDL, LDLCALC, TRIG, CHOLHDL, LDLDIRECT in the last 72 hours. ?Thyroid Function Tests: ?No results for input(s): TSH, T4TOTAL, FREET4, T3FREE, THYROIDAB in the last 72 hours. ?Anemia Panel: ?No results for input(s): VITAMINB12, FOLATE, FERRITIN, TIBC, IRON, RETICCTPCT in the last 72 hours. ?Sepsis Labs: ?Recent Labs  ?Lab 11/26/21 ?2203 11/27/21 ?0037 11/27/21 ?3086  ?LATICACIDVEN 3.1* 3.6* 1.7  ? ? ?Recent Results (from the past 240 hour(s))  ?Resp Panel by RT-PCR (Flu A&B, Covid) Nasopharyngeal Swab     Status:  None  ? Collection Time: 11/26/21  9:34 AM  ? Specimen: Nasopharyngeal Swab; Nasopharyngeal(NP) swabs in vial transport medium  ?Result Value Ref Range Status  ? SARS Coronavirus 2 by RT PCR NEGATIVE NEGATIVE Final  ?  Comment: (NOTE) ?SARS-CoV-2 target nucleic acids are NOT DETECTED. ? ?The SARS-CoV-2 RNA is generally detectable in upper respiratory ?specimens during the acute phase of infection. The lowest ?concentration of SARS-CoV-2 viral copies this assay can detect is ?138 copies/mL. A negative result does not preclude SARS-Cov-2 ?infection and should not be used as the sole basis for treatment or ?other patient management decisions. A negative result may occur with  ?improper specimen collection/handling, submission of specimen other ?than nasopharyngeal swab, presence of viral mutation(s) within the ?areas targeted by this assay, and inadequate number of viral ?copies(<138 copies/mL).  A negative result must be combined with ?clinical observations, patient history, and epidemiological ?information. The expected result is Negative. ? ?Fact Sheet for Patients:  ?EntrepreneurPulse.com.au ? ?Fact Sheet for Healthcare Providers:  ?IncredibleEmployment.be ? ?This test is no t yet approved or cleared by the [place] FDA and  ?has been authorized for detection and/or diagnosis of SARS-CoV-2 by ?FDA under an Emergency Use Authorization (EUA). This EUA will remain  ?in effect (meaning this test can be used) for the duration of the ?COVID-19 declaration under Section 564(b)(1) of the Act, 21 ?U.S.C.section 360bbb-3(b)(1), unless the authorization is terminated  ?or revoked sooner.  ? ? ?  ? Influenza A by PCR NEGATIVE NEGATIVE Final  ? Influenza B by PCR NEGATIVE NEGATIVE Final  ?  Comment: (NOTE) ?The Xpert Xpress SARS-CoV-2/FLU/RSV plus assay is intended as an aid ?in the diagnosis of influenza from Nasopharyngeal swab specimens and ?should not be used as a sole basis for  treatment. Nasal washings and ?aspirates are unacceptable for Xpert Xpress SARS-CoV-2/FLU/RSV ?testing. ? ?Fact Sheet for Patients: ?EntrepreneurPulse.com.au ? ?Fact Sheet for Healthcare Provider

## 2021-11-27 NOTE — H&P (Signed)
?History and Physical  ? ? ?Robin [name] PFX:[id number] DOB: 1966-07-06 DOA: 11/26/2021 ? ?PCP: Inda Coke, PA  ? ?Patient coming from: Home  ? ?Chief Complaint: SOB  ? ?HPI: Robin [name] is a pleasant 56 y.o. female with medical history significant for atrial fibrillation on Eliquis, chronic systolic CHF, CKD 3B, insulin-dependent diabetes mellitus, and history of CVA with residual right-sided weakness and aphasia, now presenting to the emergency department with shortness of breath.  Patient had been seen earlier in the day for headache and right facial numbness, had no acute findings on head CT, was in rapid A-fib, was treated with Compazine, IV Lopressor, and IV fluid bolus, felt better and went home.shortly after returning home, she developed acute dyspnea prompting her return.  History was limited by her respiratory distress. ? ?ED Course: Upon arrival to the ED, patient is found to be afebrile, tachypneic as high as 50s, tachycardic to 150, and with stable blood pressure.  EKG features rapid atrial fibrillation and QTc 519 ms.  Chest x-ray with cardiomegaly and pulmonary edema.  Chemistry panel notable for glucose 297 and creatinine 1.  6 2.  CBC with stable anemia.  Lactic acid elevated to 3.1.  Troponin was normal x2 and BNP elevated at 1106.  ED physician discussed case with cardiology and it was recommended that she be treated with IV Lasix and oral Lopressor.  She was given 40 mg IV Lasix and 25 mg oral Lopressor. ? ?Review of Systems:  ?All other systems reviewed and apart from HPI, are negative. ? ?Past Medical History:  ?Diagnosis Date  ? A-fib ([place])   ? Abdominal aortic aneurysm (AAA) ([place])   ? Allergy   ? Anemia   ? Arthritis   ? Atrial fibrillation ([place])   ? CHF (congestive heart failure) ([place])   ? a. EF at 30-35% by echo in 08/2018 b. EF at 35% by repeat echo in 05/2019  ? Chronic abdominal pain   ? Chronic atrial fibrillation (HCC)   ? Cocaine abuse ([place])   ? COPD (chronic obstructive  pulmonary disease) ([place])   ? Diabetes mellitus without complication ([place])   ? Essential hypertension, benign   ? Expressive aphasia   ? Expressive aphasia   ? post CVA  ? Fatty liver   ? GERD (gastroesophageal reflux disease)   ? Gout 2016  ? Normal coronary arteries   ? 3/10 - following abnormal Myoview  ? Ovarian cyst   ? Stroke [facility]) 12/26/2019  ? Right sided weakness, and expressive aphasia  ? Stroke [facility])   ? Thoracic ascending aortic aneurysm ([place])   ? 4 cm 10/31/19 CTA  ? Type 2 diabetes mellitus ([place])   ? type II  ? ? ?Past Surgical History:  ?Procedure Laterality Date  ? ABDOMINAL HYSTERECTOMY  09/10/2011  ? Procedure: HYSTERECTOMY ABDOMINAL;  Surgeon: Jonnie Kind, MD;  Location: [facility];  Service: Gynecology;  Laterality: N/A;  Abdominal hysterectomy  ? CESAREAN SECTION  [id number], and 1994  ? CHOLECYSTECTOMY  1995  ? IR 3D INDEPENDENT WKST  03/16/2020  ? IR ANGIO INTRA EXTRACRAN SEL INTERNAL CAROTID BILAT MOD SED  03/16/2020  ? IR ANGIO VERTEBRAL SEL SUBCLAVIAN INNOMINATE UNI L MOD SED  03/16/2020  ? IR ANGIO VERTEBRAL SEL VERTEBRAL UNI R MOD SED  03/16/2020  ? IR CT HEAD LTD  12/27/2019  ? IR PERCUTANEOUS ART THROMBECTOMY/INFUSION INTRACRANIAL INC DIAG ANGIO  12/27/2019  ? IR US GUIDE VASC ACCESS RIGHT  12/27/2019  ?  IR US GUIDE VASC ACCESS RIGHT  03/16/2020  ? RADIOLOGY WITH ANESTHESIA N/A 12/27/2019  ? Procedure: IR WITH ANESTHESIA;  Surgeon: Luanne Bras, MD;  Location: [place];  Service: Radiology;  Laterality: N/A;  ? SCAR REVISION  09/10/2011  ? Procedure: SCAR REVISION;  Surgeon: Jonnie Kind, MD;  Location: [facility];  Service: Gynecology;  Laterality: N/A;  Wide Excision of old Cicatrix  ? TUBAL LIGATION  1994  ? ? ?Social History:  ? reports that she quit smoking about 1 years ago. Her smoking use included cigarettes. She has never used smokeless tobacco. She reports that she does not currently use alcohol. She reports that she does not currently use drugs. ? ?Allergies  ?Allergen Reactions  ? Bee  Venom Shortness Of Breath and Swelling  ?  Bodily Swelling  ? Losartan Other (See Comments)  ?  Nosebleeds per patient report.   ? Naproxen Other (See Comments)  ?  Acid reflux  ? Penicillins Nausea Only  ?  Has patient had a PCN reaction causing immediate rash, facial/tongue/throat swelling, SOB or lightheadedness with hypotension: no ?Has patient had a PCN reaction causing severe rash involving mucus membranes or skin necrosis: no ?Has patient had a PCN reaction that required hospitalization no ?Has patient had a PCN reaction occurring within the last 10 years: no ?If all of the above answers are "NO", then may proceed with Cephalosporin   ? Lisinopril   ?  Sinus congestion  ? ? ?Family History  ?Problem Relation Age of Onset  ? Cirrhosis Mother   ? Early death Mother   ? Alcohol abuse Mother   ? Diabetes type II Father   ? Alcohol abuse Father   ? Diabetes type II Sister   ? Early death Brother   ? Alcohol abuse Son   ? Anesthesia problems Neg Hx   ? Hypotension Neg Hx   ? Malignant hyperthermia Neg Hx   ? Pseudochol deficiency Neg Hx   ? Colon cancer Neg Hx   ? Colon polyps Neg Hx   ? Esophageal cancer Neg Hx   ? Rectal cancer Neg Hx   ? Stomach cancer Neg Hx   ? ? ? ?Prior to Admission medications   ?Medication Sig Start Date End Date Taking? Authorizing Provider  ?Accu-Chek Softclix Lancets lancets Use as instructed 06/13/21   Shamleffer, Melanie Crazier, MD  ?aspirin EC 81 MG tablet Take 1 tablet (81 mg total) by mouth daily. Swallow whole. 07/19/20   Strader, Fransisco Hertz, PA-C  ?baclofen 5 MG TABS Take 5 mg by mouth 2 (two) times daily. ?Patient not taking: Reported on 08/28/2021 07/02/21   Davonna Belling, MD  ?BD PEN NEEDLE NANO U/F 32G X 4 MM MISC Inject 1 Device into the skin in the morning, at noon, in the evening, and at bedtime. 06/13/21   Shamleffer, Melanie Crazier, MD  ?Blood Glucose Monitoring Suppl (ACCU-CHEK GUIDE ME) w/Device KIT 1 Device by Does not apply route 3 (three) times daily. 03/09/21    Shamleffer, Melanie Crazier, MD  ?carvedilol (COREG) 25 MG tablet TAKE 1 TABLET BY MOUTH TWICE DAILY WITH A MEAL 08/24/21   Inda Coke, PA  ?Dulaglutide (TRULICITY) 1.5 LZ/7.6BH SOPN Inject 1.5 mg into the skin once a week. 10/24/21   Shamleffer, Melanie Crazier, MD  ?Arne [place] 5 MG TABS tablet Take 1 tablet by mouth twice daily 08/24/21   Inda Coke, PA  ?furosemide (LASIX) 20 MG tablet Take 20 mg daily. May take additional 20 mg  if weight gain is over 2 lbs overnight, or 5 lbs in 1 week 08/14/21   Ahmed Prima, Fransisco Hertz, PA-C  ?gabapentin (NEURONTIN) 100 MG capsule TAKE 2 CAPSULES BY MOUTH THREE TIMES DAILY 11/16/21   Inda Coke, PA  ?glucose blood (ACCU-CHEK GUIDE) test strip 1 each by Other route 3 (three) times daily. Use as instructed 06/13/21   Shamleffer, Melanie Crazier, MD  ?hydrALAZINE (APRESOLINE) 25 MG tablet TAKE 1 TABLET BY MOUTH THREE TIMES DAILY 08/24/21   Inda Coke, PA  ?insulin aspart (NOVOLOG FLEXPEN) 100 UNIT/ML FlexPen Inject 12 Units into the skin 3 (three) times daily with meals. 06/13/21   Shamleffer, Melanie Crazier, MD  ?insulin glargine (LANTUS SOLOSTAR) 100 UNIT/ML Solostar Pen Inject 40 Units into the skin daily. 10/24/21   Shamleffer, Melanie Crazier, MD  ?isosorbide mononitrate (IMDUR) 30 MG 24 hr tablet Take 2 tablets by mouth once daily 08/24/21   Inda Coke, PA  ?metoCLOPramide (REGLAN) 10 MG tablet Take 1 tablet (10 mg total) by mouth every 6 (six) hours as needed for nausea. 07/11/21   Evalee Jefferson, PA-C  ?nitroGLYCERIN (NITROSTAT) 0.4 MG SL tablet Place 1 tablet (0.4 mg total) under the tongue every 5 (five) minutes as needed for chest pain. 02/01/20   Angiulli, Lavon Paganini, PA-C  ?pantoprazole (PROTONIX) 40 MG tablet Take 1 tablet (40 mg total) by mouth daily. 10/08/21   Inda Coke, [place]  ? ? ?Physical Exam: ?Vitals:  ? 11/27/21 0200 11/27/21 0230 11/27/21 0330 11/27/21 0400  ?BP: (!) 133/98 (!) 146/110 131/78 (!) 148/116  ?Pulse:  (!) 125 (!) 125 78  ?Resp: (!) 30  (!) 24 (!) 21 (!) 23  ?Temp:      ?TempSrc:      ?SpO2: 98% 98% 96% 98%  ?Weight:      ?Height:      ? ? ?Constitutional: No diaphoresis. No pallor.   ?Eyes: PERTLA, lids and conjunctivae normal ?ENMT: Mucous

## 2021-11-27 NOTE — Progress Notes (Signed)
Inpatient Diabetes Program Recommendations ? ?AACE/ADA: New Consensus Statement on Inpatient Glycemic Control (2015) ? ?Target Ranges:  Prepandial:   less than 140 mg/dL ?     Peak postprandial:   less than 180 mg/dL (1-2 hours) ?     Critically ill patients:  140 - 180 mg/dL  ? ?Lab Results  ?Component Value Date  ? GLUCAP 237 (H) 11/27/2021  ? HGBA1C 12.3 (A) 10/24/2021  ? ? ?Review of Glycemic Control ? Latest Reference Range & Units 11/27/21 07:45 11/27/21 11:16  ?Glucose-Capillary 70 - 99 mg/dL 292 (H) 237 (H)  ?(H): Data is abnormally high ?Diabetes history: Type 2 Dm ?Outpatient Diabetes medications: Trulicity 1.5 mg qwk, Novolog 12 units TId, Lantus 40 units QD ?Current orders for Inpatient glycemic control: Novolog 0-9 units TID & HS, Semglee 40 units QD ? ?Inpatient Diabetes Program Recommendations:   ? ?Spoke with patient's sister, Ms Nonie Hoyer. Per RN, patient unable to maintain conversation due to previous CVA.  ?Reviewed patient's current A1c of 12.3%. Explained what a A1c is and what it measures. Also reviewed goal A1c with patient, importance of good glucose control @ home, and blood sugar goals. Reviewed patho of DM, need for improved glucose control, survival skills, hypo vs hyper glycemia, vascular changes and commorbidities.  ?Patient has a meter and sister reports that values have been in the mid 200's mg/dL. This is an improvement compared to previous values in the 500's mg/dL.discussed continuous glucose monitors. Endocrinology attempted to order Dexcom, but was not approved through insurance.  ?At discharge, please reorder [facility]  ?-Reader device- [phone] ?- Sensors- [phone] ?Patient has an aide that comes to her home daily to ensure medication administration. Patient's sister denies missed doses. Reviewed when to call MD. Has a follow up.  ?Struggles to consume non-sugary beverages. Reviewed potential alternatives as other options. Has no further questions at this time.   ? ?Thanks, ?Bronson Curb, MSN, RNC-OB ?Diabetes Coordinator ?[phone] (8a-5p) ? ? ? ? ? ?

## 2021-11-28 DIAGNOSIS — I4891 Unspecified atrial fibrillation: Secondary | ICD-10-CM | POA: Diagnosis not present

## 2021-11-28 DIAGNOSIS — I5020 Unspecified systolic (congestive) heart failure: Secondary | ICD-10-CM | POA: Diagnosis not present

## 2021-11-28 LAB — GLUCOSE, CAPILLARY
Glucose-Capillary: 191 mg/dL — ABNORMAL HIGH (ref 70–99)
Glucose-Capillary: 272 mg/dL — ABNORMAL HIGH (ref 70–99)
Glucose-Capillary: 272 mg/dL — ABNORMAL HIGH (ref 70–99)
Glucose-Capillary: 275 mg/dL — ABNORMAL HIGH (ref 70–99)

## 2021-11-28 LAB — CBC
HCT: 29.8 % — ABNORMAL LOW (ref 36.0–46.0)
Hemoglobin: 8.7 g/dL — ABNORMAL LOW (ref 12.0–15.0)
MCH: 22.1 pg — ABNORMAL LOW (ref 26.0–34.0)
MCHC: 29.2 g/dL — ABNORMAL LOW (ref 30.0–36.0)
MCV: 75.6 fL — ABNORMAL LOW (ref 80.0–100.0)
Platelets: 296 10*3/uL (ref 150–400)
RBC: 3.94 MIL/uL (ref 3.87–5.11)
RDW: 15.2 % (ref 11.5–15.5)
WBC: 7.9 10*3/uL (ref 4.0–10.5)
nRBC: 0 % (ref 0.0–0.2)

## 2021-11-28 LAB — BASIC METABOLIC PANEL
Anion gap: 10 (ref 5–15)
BUN: 30 mg/dL — ABNORMAL HIGH (ref 6–20)
CO2: 23 mmol/L (ref 22–32)
Calcium: 8.8 mg/dL — ABNORMAL LOW (ref 8.9–10.3)
Chloride: 106 mmol/L (ref 98–111)
Creatinine, Ser: 1.94 mg/dL — ABNORMAL HIGH (ref 0.44–1.00)
GFR, Estimated: 30 mL/min — ABNORMAL LOW (ref >60)
Glucose, Bld: 203 mg/dL — ABNORMAL HIGH (ref 70–99)
Potassium: 3.3 mmol/L — ABNORMAL LOW (ref 3.5–5.1)
Sodium: 139 mmol/L (ref 135–145)

## 2021-11-28 MED ORDER — FUROSEMIDE 10 MG/ML IJ SOLN
40.0000 mg | Freq: Every day | INTRAMUSCULAR | Status: DC
  Administered 2021-11-29: 40 mg via INTRAVENOUS
  Filled 2021-11-28: qty 4

## 2021-11-28 MED ORDER — HYDRALAZINE HCL 25 MG PO TABS
25.0000 mg | ORAL_TABLET | Freq: Three times a day (TID) | ORAL | Status: DC
  Administered 2021-11-28 – 2021-12-01 (×9): 25 mg via ORAL
  Filled 2021-11-28 (×9): qty 1

## 2021-11-28 MED ORDER — INSULIN ASPART 100 UNIT/ML IJ SOLN
5.0000 [IU] | Freq: Three times a day (TID) | INTRAMUSCULAR | Status: DC
  Administered 2021-11-29 – 2021-12-01 (×7): 5 [IU] via SUBCUTANEOUS

## 2021-11-28 MED ORDER — CARVEDILOL 12.5 MG PO TABS
37.5000 mg | ORAL_TABLET | Freq: Two times a day (BID) | ORAL | Status: DC
  Administered 2021-11-28 – 2021-11-29 (×2): 37.5 mg via ORAL
  Filled 2021-11-28 (×2): qty 3

## 2021-11-28 MED ORDER — POTASSIUM CHLORIDE CRYS ER 20 MEQ PO TBCR
40.0000 meq | EXTENDED_RELEASE_TABLET | Freq: Once | ORAL | Status: AC
  Administered 2021-11-28: 40 meq via ORAL
  Filled 2021-11-28: qty 2

## 2021-11-28 NOTE — Progress Notes (Signed)
Inpatient Diabetes Program Recommendations ? ?AACE/ADA: New Consensus Statement on Inpatient Glycemic Control (2015) ? ?Target Ranges:  Prepandial:   less than 140 mg/dL ?     Peak postprandial:   less than 180 mg/dL (1-2 hours) ?     Critically ill patients:  140 - 180 mg/dL  ? ?Lab Results  ?Component Value Date  ? GLUCAP 272 (H) 11/28/2021  ? HGBA1C 12.3 (A) 10/24/2021  ? ? ?Review of Glycemic Control ? Latest Reference Range & Units 11/27/21 07:45 11/27/21 11:16 11/27/21 16:15 11/27/21 20:52 11/28/21 07:50 11/28/21 11:48  ?Glucose-Capillary 70 - 99 mg/dL 292 (H) 237 (H) 229 (H) 303 (H) 191 (H) 272 (H)  ?(H): Data is abnormally high ? ?Diabetes history: Type 2 Dm ?Outpatient Diabetes medications: Trulicity 1.5 mg qwk, Novolog 12 units TId, Lantus 40 units QD ?Current orders for Inpatient glycemic control: Novolog 0-9 units TID & HS, Semglee 50 units QD ? ?Inpatient Diabetes Program Recommendations:   ?-Add Novolog 5 units tid meal coverage if eats 50% ?Secure chat sent to Dr. Dyann Kief. ? ?Thank you, ?Nani Gasser Taariq Leitz, RN, MSN, CDE  ?Diabetes Coordinator ?Inpatient Glycemic Control Team ?Team Pager [phone] (8am-5pm) ?11/28/2021 12:30 PM ? ? ? ? ?

## 2021-11-28 NOTE — TOC Initial Note (Signed)
Transition of Care (TOC) - Initial/Assessment Note  ? ? ?Patient Details  ?Name: Robin [name] ?MRN: [id number] ?Date of Birth: [birth year] ? ?Transition of Care (TOC) CM/SW Contact:    ?Avina Eberle D, LCSW ?Phone Number: ?11/28/2021, 1:31 PM ? ?Clinical Narrative:                 ?Patient from home alone. Has a nurse from [place] [facility] [facility] home care that comes daily from 9-1. Does not drive. Does not follow a HH diet. Takes daily weights. Ambulates with a cane. Will discharge to patient's sister's home or boyfriend's home.  ? ?Expected Discharge Plan: Home/Self Care ?Barriers to Discharge: Continued Medical Work up ? ? ?Patient Goals and CMS Choice ?Patient states their goals for this hospitalization and ongoing recovery are:: return home ?  ?  ? ?Expected Discharge Plan and Services ?Expected Discharge Plan: Home/Self Care ?  ?  ?  ?Living arrangements for the past 2 months: [place] ?                ?  ?  ?  ?  ?  ?  ?  ?  ?  ?  ? ?Prior Living Arrangements/Services ?Living arrangements for the past 2 months: [place] ?Lives with:: Self ?Patient language and need for interpreter reviewed:: Yes ?Do you feel safe going back to the place where you live?: Yes      ?Need for Family Participation in Patient Care: Yes (Comment) ?Care giver support system in place?: Yes (comment) ?Current home services: DME ?Criminal Activity/Legal Involvement Pertinent to Current Situation/Hospitalization: No - Comment as needed ? ?Activities of Daily Living ?  ?  ? ?Permission Sought/Granted ?Permission sought to share information with : Family Supports ?  ? Share Information with NAME: daughter, Laury Axon Boback ?   ?   ?   ? ?Emotional Assessment ?  ?  ?  ?Orientation: : Oriented to Self, Oriented to Place, Oriented to  Time ?Alcohol / Substance Use: Not Applicable ?Psych Involvement: No (comment) ? ?Admission diagnosis:  Acute on chronic systolic CHF (congestive heart failure) ([place]) [I50.23] ?Patient Active Problem List   ? Diagnosis Date Noted  ? Acute on chronic systolic CHF (congestive heart failure) ([place]) 11/27/2021  ? Stage 3b chronic kidney disease (CKD) ([place]) 11/27/2021  ? Microcytic anemia 11/27/2021  ? Lactic acidosis 11/27/2021  ? Diabetes mellitus ([place]) 03/09/2021  ? Type 2 diabetes mellitus with stage 3a chronic kidney disease, with long-term current use of insulin ([place]) 03/09/2021  ? Acute metabolic encephalopathy 41/96/2229  ? Hyperglycemia 12/31/2020  ? Prolonged QT interval 05/12/2020  ? History of CVA with residual deficit 05/12/2020  ? Atrial fibrillation, chronic ([place]) 05/12/2020  ? Right hemiparesis ([place])   ? Essential hypertension   ? Chronic diastolic congestive heart failure ([place])   ? Dysphagia due to recent stroke 01/05/2020  ? 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 Embolic cerebral infarction [facility]) s/p clot retrieval 12/27/2019  ? Thoracic aortic aneurysm without rupture ([place])   ? Atrial fibrillation with RVR ([place]) 05/10/2019  ? Type 2 diabetes mellitus with diabetic autonomic neuropathy, with long-term current use of insulin ([place]) 09/01/2018  ? Cocaine abuse ([place]) 08/25/2018  ? Cigarette nicotine dependence, uncomplicated 79/89/2119  ? GERD (gastroesophageal reflux disease) 06/07/2011  ? COPD (chronic obstructive pulmonary disease) ([place]) 06/07/2011  ? Arthritis 06/07/2011  ? ?PCP:  Inda Coke, PA ?Pharmacy:   ?[place], [place] [phone] ?[address] ?[place] [address] ?Phone: [phone] Fax: [phone] ? ?[facility] (New Address) - [place], [place] AT Previously: [place], [place] ?[place] ?[address] ?[place] [address] ?Phone: [phone] Fax: [phone] ? ? ? ? ?Social Determinants of Health (SDOH) Interventions ?  ? ?Readmission Risk Interventions ? ?  08/15/2020  ?  3:41 PM 05/12/2020  ? 12:58 PM  ?Readmission Risk Prevention  Plan  ?Transportation Screening Complete Complete  ?Medication Review Press photographer) Complete Complete  ?[place] or Home Care Consult Complete   ?SW Recovery Care/Counseling Consult Complete Complete  ?Palliative Care Screening Not Applicable Not Applicable  ?Skilled Nursing Facility  Complete  ? ? ? ?

## 2021-11-28 NOTE — Progress Notes (Signed)
? ?Progress Note ? ?Patient Name: Robin [name] ?Date of Encounter: 11/28/2021 ? ?Primary Cardiologist: Carlyle Dolly, MD ? ?Subjective  ? ?Sitting in bedside chair.  No chest pain or palpitations, no breathlessness at rest.  Still feels tightness in her legs. ? ?Inpatient Medications  ?  ?Scheduled Meds: ? apixaban  5 mg Oral BID  ? carvedilol  25 mg Oral BID WC  ? Chlorhexidine Gluconate Cloth  6 each Topical Q0600  ? furosemide  40 mg Intravenous Q12H  ? gabapentin  200 mg Oral TID  ? hydrALAZINE  50 mg Oral TID  ? insulin aspart  0-5 Units Subcutaneous QHS  ? insulin aspart  0-9 Units Subcutaneous TID WC  ? insulin glargine-yfgn  50 Units Subcutaneous Daily  ? isosorbide mononitrate  60 mg Oral Daily  ? pantoprazole  40 mg Oral Daily  ? sodium chloride flush  3 mL Intravenous Q12H  ? ? ?PRN Meds: ?acetaminophen **OR** acetaminophen, metoCLOPramide, metoprolol tartrate  ? ?Vital Signs  ?  ?Vitals:  ? 11/27/21 2048 11/28/21 0427 11/28/21 0500 11/28/21 0820  ?BP: 107/74 105/70  114/77  ?Pulse: (!) 104 99  (!) 106  ?Resp: 20 18    ?Temp: 98.6 ?F (37 ?C)     ?TempSrc: Oral     ?SpO2: 98% 99%    ?Weight:   105.9 kg   ?Height:      ? ? ?Intake/Output Summary (Last 24 hours) at 11/28/2021 1058 ?Last data filed at 11/28/2021 0700 ?Gross per 24 hour  ?Intake 480 ml  ?Output 1700 ml  ?Net -1220 ml  ? ?Filed Weights  ? 11/26/21 2157 11/27/21 0730 11/28/21 0500  ?Weight: 106.6 kg 105.2 kg 105.9 kg  ? ? ?Telemetry  ?  ?Atrial fibrillation.  Personally reviewed. ? ?ECG  ?  ?An ECG dated 11/26/2021 was personally reviewed today and demonstrated:  Atrial fibrillation with RVR, poor R wave progression. ? ?Physical Exam  ? ?GEN: No acute distress.   ?Neck: No JVD. ?Cardiac: Irregularly irregular without gallop.  ?Respiratory: Nonlabored. Clear to auscultation bilaterally. ?GI: Soft, nontender, bowel sounds present. ?MS: Mild lower leg edema. ?Neuro:  Nonfocal. ?Psych: Alert and oriented x 3. Normal affect. ? ?Labs  ?   ?Chemistry ?Recent Labs  ?Lab 11/26/21 ?2203 11/27/21 ?0424 11/28/21 ?0421  ?NA 138 138 139  ?K 4.7 4.7 3.3*  ?CL 110 109 106  ?CO2 17* 18* 23  ?GLUCOSE 297* 335* 203*  ?BUN 23* 26* 30*  ?CREATININE 1.62* 1.81* 1.94*  ?CALCIUM 9.1 9.3 8.8*  ?PROT 7.9 8.1  --   ?ALBUMIN 3.9 4.0  --   ?AST 44* 43*  --   ?ALT 27 31  --   ?ALKPHOS 101 104  --   ?BILITOT 1.0 1.0  --   ?GFRNONAA 37* 33* 30*  ?ANIONGAP '11 11 10  '$ ?  ? ?Hematology ?Recent Labs  ?Lab 11/26/21 ?2203 11/27/21 ?0424 11/28/21 ?0421  ?WBC 8.8 10.6* 7.9  ?RBC 4.28 4.20 3.94  ?HGB 9.7* 9.6* 8.7*  ?HCT 32.4* 31.7* 29.8*  ?MCV 75.7* 75.5* 75.6*  ?MCH 22.7* 22.9* 22.1*  ?MCHC 29.9* 30.3 29.2*  ?RDW 15.4 15.2 15.2  ?PLT 333 361 296  ? ? ?Cardiac Enzymes ?Recent Labs  ?Lab 11/26/21 ?2203 11/27/21 ?0037  ?TROPONINIHS 12 13  ? ? ?BNP ?Recent Labs  ?Lab 11/26/21 ?2244  ?BNP 1,106.0*  ?  ? ?Radiology  ?  ?CT HEAD WO CONTRAST ? ?Result Date: 11/26/2021 ?CLINICAL DATA:  Mental status change, unknown cause  EXAM: CT HEAD WITHOUT CONTRAST TECHNIQUE: Contiguous axial images were obtained from the base of the skull through the vertex without intravenous contrast. RADIATION DOSE REDUCTION: This exam was performed according to the departmental dose-optimization program which includes automated exposure control, adjustment of the mA and/or kV according to patient size and/or use of iterative reconstruction technique. COMPARISON:  11/26/2021 FINDINGS: Brain: Remote left MCA infarct with encephalomalacia, stable. No acute intracranial abnormality. Specifically, no hemorrhage, hydrocephalus, mass lesion, acute infarction, or significant intracranial injury. Vascular: No hyperdense vessel or unexpected calcification. Skull: No acute calvarial abnormality. Sinuses/Orbits: No acute findings Other: None IMPRESSION: Old left MCA infarct with encephalomalacia. No acute intracranial abnormality. Electronically Signed   By: Rolm Baptise M.D.   On: 11/26/2021 23:46  ? ?CT Head Wo  Contrast ? ?Result Date: 11/26/2021 ?CLINICAL DATA:  Headache, new or worsening (Age >= 50y) EXAM: CT HEAD WITHOUT CONTRAST TECHNIQUE: Contiguous axial images were obtained from the base of the skull through the vertex without intravenous contrast. RADIATION DOSE REDUCTION: This exam was performed according to the departmental dose-optimization program which includes automated exposure control, adjustment of the mA and/or kV according to patient size and/or use of iterative reconstruction technique. COMPARISON:  CT head July 02, 2021. FINDINGS: Brain: Similar remote left anterior MCA territory infarct. No evidence of acute large vascular territory infarct. No evidence of acute hemorrhage, mass lesion, midline shift, or hydrocephalus. Vascular: No hyperdense vessel identified. Skull: No acute fracture. Sinuses/Orbits: Clear visualized sinuses. No acute orbital findings. Other: No mastoid effusions. IMPRESSION: 1. No evidence of acute intracranial abnormality. 2. Remote left anterior MCA territory infarct. Electronically Signed   By: Margaretha Sheffield M.D.   On: 11/26/2021 11:13  ? ?DG Chest Portable 1 View ? ?Result Date: 11/26/2021 ?CLINICAL DATA:  Weakness and shortness of breath. EXAM: PORTABLE CHEST 1 VIEW COMPARISON:  Chest x-ray 08/28/2021. FINDINGS: The heart is enlarged. There is central pulmonary vascular congestion and bilateral hilar prominence. There are minimal patchy bibasilar opacities. Costophrenic angles are clear. No pneumothorax. IMPRESSION: 1. Cardiomegaly with mild pulmonary edema pattern. 2. Bilateral hilar prominence may be related to edema. Recommend follow-up PA and lateral chest x-ray in 4-6 weeks to confirm resolution. Electronically Signed   By: Ronney Asters M.D.   On: 11/26/2021 23:31  ? ?ECHOCARDIOGRAM LIMITED ? ?Result Date: 11/27/2021 ?   ECHOCARDIOGRAM LIMITED REPORT   Patient Name:   Robin [name] Date of Exam: 11/27/2021 Medical Rec #:  [id number]      Height:       65.0 in  Accession #:    [id number]     Weight:       231.9 lb Date of Birth:  1966/05/15     BSA:          2.107 m? Patient Age:    56 years       BP:           165/126 mmHg Patient Gender: F              HR:           109 bpm. Exam Location:  [name] Procedure: Limited Echo Indications:    I50.21 CHF, Assess LVEF  History:        Patient has prior history of Echocardiogram examinations, most                 recent 08/13/2021. CHF, Stroke and COPD, Arrythmias:Atrial  Fibrillation; Risk Factors:Hypertension and Diabetes. Cocaine                 abuse ([place]), Expressive aphasia (From Hx).  Sonographer:    Alvino Chapel RCS Referring Phys: [id number] [place]  1. Left ventricular ejection fraction, by estimation, is 25 to 30%. The left ventricle has severely decreased function. The left ventricle demonstrates global hypokinesis. There is mild concentric left ventricular hypertrophy.  2. Right ventricular systolic function is normal. The right ventricular size is normal.  3. Left atrial size was severely dilated.  4. The mitral valve is normal in structure. No evidence of mitral valve regurgitation. No evidence of mitral stenosis.  5. The aortic valve was not well visualized. Aortic valve regurgitation is not visualized. FINDINGS  Left Ventricle: Left ventricular ejection fraction, by estimation, is 25 to 30%. The left ventricle has severely decreased function. The left ventricle demonstrates global hypokinesis. Definity contrast agent was given IV to delineate the left ventricular endocardial borders. The left ventricular internal cavity size was normal in size. There is mild concentric left ventricular hypertrophy. Right Ventricle: The right ventricular size is normal. No increase in right ventricular wall thickness. Right ventricular systolic function is normal. Left Atrium: Left atrial size was severely dilated. Right Atrium: Right atrial size was normal in size. Pericardium: There is no  evidence of pericardial effusion. Mitral Valve: The mitral valve is normal in structure. Mild to moderate mitral annular calcification. No evidence of mitral valve stenosis. Tricuspid Valve: The tricuspid valve is normal in structure. Tr

## 2021-11-28 NOTE — Progress Notes (Signed)
?PROGRESS NOTE ? ? ? ?Robin [name]  BOF:[id number] DOB: 01/25/1966 DOA: 11/26/2021 ?PCP: Inda Coke, PA ? ? ?Brief Narrative:  ?Robin [name] is a pleasant 56 y.o. female with medical history significant for atrial fibrillation on Eliquis, chronic systolic CHF, CKD 3B, insulin-dependent diabetes mellitus, and history of CVA with residual right-sided weakness and aphasia, now presenting to the emergency department with shortness of breath.  Patient is well-known to the cardiac team here, admitted for acute recurrent systolic heart failure exacerbation and hypoxia.  Initially requiring BiPAP for respiratory support but improving drastically since admission. ? ?Assessment & Plan: ?  ?Principal Problem: ?  Acute on chronic systolic CHF (congestive heart failure) ([place]) ?Active Problems: ?  Type 2 diabetes mellitus with diabetic autonomic neuropathy, with long-term current use of insulin ([place]) ?  Atrial fibrillation with RVR ([place]) ?  History of CVA with residual deficit ?  Stage 3b chronic kidney disease (CKD) ([place]) ?  Microcytic anemia ?  Lactic acidosis ? ?Acute on chronic systolic CHF; acute hypoxic respiratory failure, POA ?-Continue aggressive diuresis and follow cardiology service recommendations. ?-Respiratory status markedly improving, no longer BiPAP dependent, able to wean to room air today at rest ?-EF was 40-45% in January; repeat echo demonstrating ejection fraction of 25 to 30% by estimation.  There is global hypokinesis and mild concentric left ventricular hypertrophy. ?  ?Atrial fibrillation with RVR  ?-Likely provoked secondary to above - rate 140s in ED  ?-Continue Lopressor, Eliquis   ?  ?Lactic acidosis, resolved ?-Within normal limits, likely secondary to profound volume overload and hypoxia ?-No fever or evidence for infection, she is not hypotensive and extremities are warm ?-Likely from acute pulmonary edema  ?-Trend while diuresing  ?  ?Hx of CVA  ?- Residual hemiparesis and aphasia -has  stuttering speech, this is her baseline per family at bedside -can answer yes/no questions quite readily however ?- Continue Eliquis for secondary prevention. ?  ?AKI on CKD IIIb  ?-Baseline creatinine around 1.6 currently 1.8 in the setting of volume overload and diuresis ?  ?IDDM, profoundly uncontrolled with hyperglycemia ?- A1c was 12.3% in March 2023  ?-Continue sliding scale insulin, hypoglycemic protocol  ?-Modified carbohydrate diet has been encouraged. ?-Will add 3 times daily meal coverage NovoLog. ?  ?DVT prophylaxis: Eliquis  ?Code Status: Full  ?Family Communication: Son at bedside ? ?Status is: Inpatient ? ?Dispo: The patient is from: Home ?             Anticipated d/c is to: To be determined ?             Anticipated d/c date is: 24-48+ hours ?             Patient currently not medically stable for discharge ? ?Consultants:  ?Cardiology ? ?Procedures:  ?None ? ?Antimicrobials:  ?None indicated ? ?Subjective: ?Patient denies chest pain, no nausea or vomiting; still complaining of short winded sensation with activity and orthopnea. ? ?Objective: ?Vitals:  ? 11/28/21 0427 11/28/21 0500 11/28/21 0820 11/28/21 1405  ?BP: 105/70  114/77 105/66  ?Pulse: 99  (!) 106 (!) 105  ?Resp: 18   20  ?Temp:    98.3 ?F (36.8 ?C)  ?TempSrc:    Oral  ?SpO2: 99%   98%  ?Weight:  105.9 kg    ?Height:      ? ? ?Intake/Output Summary (Last 24 hours) at 11/28/2021 1805 ?Last data filed at 11/28/2021 0700 ?Gross per 24 hour  ?Intake 480 ml  ?  Output 1000 ml  ?Net -520 ml  ? ?Filed Weights  ? 11/26/21 2157 11/27/21 0730 11/28/21 0500  ?Weight: 106.6 kg 105.2 kg 105.9 kg  ? ? ?Examination: ?General exam: Alert, awake, oriented x 3; no requiring oxygen supplementation but complaining of short winded sensation and orthopnea. ?Respiratory system: Decreased breath sounds at the bases, no using accessory muscles; fine crackles appreciated. ?Cardiovascular system: Irregular, no rubs, no gallops, unable to assess JVD with body  habitus. ?Gastrointestinal system: Abdomen is obese, nondistended, soft and nontender. No organomegaly or masses felt. Normal bowel sounds heard. ?Central nervous system: Alert and oriented. No new focal neurological deficits. ?Extremities: No cyanosis or clubbing; 1+ edema appreciated bilaterally. ?Skin: No petechiae. ?Psychiatry: Judgement and insight appear normal.  Stable mood. ? ?Data Reviewed: I have personally reviewed following labs and imaging studies ? ?CBC: ?Recent Labs  ?Lab 11/26/21 ?8413 11/26/21 ?2203 11/27/21 ?0424 11/28/21 ?0421  ?WBC 7.4 8.8 10.6* 7.9  ?NEUTROABS 4.5 6.6  --   --   ?HGB 8.8* 9.7* 9.6* 8.7*  ?HCT 30.1* 32.4* 31.7* 29.8*  ?MCV 75.4* 75.7* 75.5* 75.6*  ?PLT 327 333 361 296  ? ?Basic Metabolic Panel: ?Recent Labs  ?Lab 11/26/21 ?2440 11/26/21 ?2203 11/26/21 ?2244 11/27/21 ?0424 11/28/21 ?0421  ?NA 137 138  --  138 139  ?K 4.0 4.7  --  4.7 3.3*  ?CL 109 110  --  109 106  ?CO2 20* 17*  --  18* 23  ?GLUCOSE 225* 297*  --  335* 203*  ?BUN 23* 23*  --  26* 30*  ?CREATININE 1.59* 1.62*  --  1.81* 1.94*  ?CALCIUM 8.9 9.1  --  9.3 8.8*  ?MG  --   --  2.2 2.0  --   ? ?GFR: ?Estimated Creatinine Clearance: 39.6 mL/min (A) (by C-G formula based on SCr of 1.94 mg/dL (H)). ? ?Liver Function Tests: ?Recent Labs  ?Lab 11/26/21 ?2203 11/27/21 ?0424  ?AST 44* 43*  ?ALT 27 31  ?ALKPHOS 101 104  ?BILITOT 1.0 1.0  ?PROT 7.9 8.1  ?ALBUMIN 3.9 4.0  ? ?Coagulation Profile: ?Recent Labs  ?Lab 11/26/21 ?2203  ?INR 1.4*  ? ?CBG: ?Recent Labs  ?Lab 11/27/21 ?1615 11/27/21 ?2052 11/28/21 ?0750 11/28/21 ?1148 11/28/21 ?1649  ?GLUCAP 229* 303* 191* 272* 272*  ? ?Sepsis Labs: ?Recent Labs  ?Lab 11/26/21 ?2203 11/27/21 ?0037 11/27/21 ?1027  ?LATICACIDVEN 3.1* 3.6* 1.7  ? ? ?Recent Results (from the past 240 hour(s))  ?Resp Panel by RT-PCR (Flu A&B, Covid) Nasopharyngeal Swab     Status: None  ? Collection Time: 11/26/21  9:34 AM  ? Specimen: Nasopharyngeal Swab; Nasopharyngeal(NP) swabs in vial transport medium   ?Result Value Ref Range Status  ? SARS Coronavirus 2 by RT PCR NEGATIVE NEGATIVE Final  ?  Comment: (NOTE) ?SARS-CoV-2 target nucleic acids are NOT DETECTED. ? ?The SARS-CoV-2 RNA is generally detectable in upper respiratory ?specimens during the acute phase of infection. The lowest ?concentration of SARS-CoV-2 viral copies this assay can detect is ?138 copies/mL. A negative result does not preclude SARS-Cov-2 ?infection and should not be used as the sole basis for treatment or ?other patient management decisions. A negative result may occur with  ?improper specimen collection/handling, submission of specimen other ?than nasopharyngeal swab, presence of viral mutation(s) within the ?areas targeted by this assay, and inadequate number of viral ?copies(<138 copies/mL). A negative result must be combined with ?clinical observations, patient history, and epidemiological ?information. The expected result is Negative. ? ?Fact Sheet for Patients:  ?  EntrepreneurPulse.com.au ? ?Fact Sheet for Healthcare Providers:  ?IncredibleEmployment.be ? ?This test is no t yet approved or cleared by the [place] FDA and  ?has been authorized for detection and/or diagnosis of SARS-CoV-2 by ?FDA under an Emergency Use Authorization (EUA). This EUA will remain  ?in effect (meaning this test can be used) for the duration of the ?COVID-19 declaration under Section 564(b)(1) of the Act, 21 ?U.S.C.section 360bbb-3(b)(1), unless the authorization is terminated  ?or revoked sooner.  ? ? ?  ? Influenza A by PCR NEGATIVE NEGATIVE Final  ? Influenza B by PCR NEGATIVE NEGATIVE Final  ?  Comment: (NOTE) ?The Xpert Xpress SARS-CoV-2/FLU/RSV plus assay is intended as an aid ?in the diagnosis of influenza from Nasopharyngeal swab specimens and ?should not be used as a sole basis for treatment. Nasal washings and ?aspirates are unacceptable for Xpert Xpress SARS-CoV-2/FLU/RSV ?testing. ? ?Fact Sheet for  Patients: ?EntrepreneurPulse.com.au ? ?Fact Sheet for Healthcare Providers: ?IncredibleEmployment.be ? ?This test is not yet approved or cleared by the [place] and ?has been authorized

## 2021-11-29 DIAGNOSIS — I4891 Unspecified atrial fibrillation: Secondary | ICD-10-CM | POA: Diagnosis not present

## 2021-11-29 DIAGNOSIS — I4819 Other persistent atrial fibrillation: Secondary | ICD-10-CM

## 2021-11-29 DIAGNOSIS — I5020 Unspecified systolic (congestive) heart failure: Secondary | ICD-10-CM | POA: Diagnosis not present

## 2021-11-29 LAB — BASIC METABOLIC PANEL
Anion gap: 8 (ref 5–15)
BUN: 36 mg/dL — ABNORMAL HIGH (ref 6–20)
CO2: 24 mmol/L (ref 22–32)
Calcium: 9 mg/dL (ref 8.9–10.3)
Chloride: 105 mmol/L (ref 98–111)
Creatinine, Ser: 1.96 mg/dL — ABNORMAL HIGH (ref 0.44–1.00)
GFR, Estimated: 30 mL/min — ABNORMAL LOW (ref >60)
Glucose, Bld: 189 mg/dL — ABNORMAL HIGH (ref 70–99)
Potassium: 3.6 mmol/L (ref 3.5–5.1)
Sodium: 137 mmol/L (ref 135–145)

## 2021-11-29 LAB — CBC
HCT: 30.9 % — ABNORMAL LOW (ref 36.0–46.0)
Hemoglobin: 9.3 g/dL — ABNORMAL LOW (ref 12.0–15.0)
MCH: 22.8 pg — ABNORMAL LOW (ref 26.0–34.0)
MCHC: 30.1 g/dL (ref 30.0–36.0)
MCV: 75.7 fL — ABNORMAL LOW (ref 80.0–100.0)
Platelets: 307 10*3/uL (ref 150–400)
RBC: 4.08 MIL/uL (ref 3.87–5.11)
RDW: 15.3 % (ref 11.5–15.5)
WBC: 8 10*3/uL (ref 4.0–10.5)
nRBC: 0 % (ref 0.0–0.2)

## 2021-11-29 LAB — GLUCOSE, CAPILLARY
Glucose-Capillary: 221 mg/dL — ABNORMAL HIGH (ref 70–99)
Glucose-Capillary: 286 mg/dL — ABNORMAL HIGH (ref 70–99)
Glucose-Capillary: 273 mg/dL — ABNORMAL HIGH (ref 70–99)
Glucose-Capillary: 226 mg/dL — ABNORMAL HIGH (ref 70–99)

## 2021-11-29 MED ORDER — CARVEDILOL 12.5 MG PO TABS
50.0000 mg | ORAL_TABLET | Freq: Two times a day (BID) | ORAL | Status: DC
  Administered 2021-11-29 – 2021-12-01 (×4): 50 mg via ORAL
  Filled 2021-11-29 (×4): qty 4

## 2021-11-29 NOTE — Progress Notes (Signed)
? ?Progress Note ? ?Patient Name: Robin [name] ?Date of Encounter: 11/29/2021 ? ?Primary Cardiologist: Carlyle Dolly, MD ? ?Subjective  ? ?No palpitations or shortness of breath at rest.  Has had some leg cramping overnight. ? ?Inpatient Medications  ?  ?Scheduled Meds: ? apixaban  5 mg Oral BID  ? carvedilol  37.5 mg Oral BID WC  ? furosemide  40 mg Intravenous Daily  ? gabapentin  200 mg Oral TID  ? hydrALAZINE  25 mg Oral TID  ? insulin aspart  0-5 Units Subcutaneous QHS  ? insulin aspart  0-9 Units Subcutaneous TID WC  ? insulin aspart  5 Units Subcutaneous TID WC  ? insulin glargine-yfgn  50 Units Subcutaneous Daily  ? isosorbide mononitrate  60 mg Oral Daily  ? pantoprazole  40 mg Oral Daily  ? sodium chloride flush  3 mL Intravenous Q12H  ? ? ?PRN Meds: ?acetaminophen **OR** acetaminophen, metoCLOPramide, metoprolol tartrate  ? ?Vital Signs  ?  ?Vitals:  ? 11/28/21 0820 11/28/21 1405 11/28/21 2123 11/29/21 0545  ?BP: 114/77 105/66 105/63 121/74  ?Pulse: (!) 106 (!) 105 96 91  ?Resp:  '20 20 18  '$ ?Temp:  98.3 ?F (36.8 ?C) 97.8 ?F (36.6 ?C) 97.9 ?F (36.6 ?C)  ?TempSrc:  Oral Oral   ?SpO2:  98% 100% 100%  ?Weight:    106.9 kg  ?Height:      ? ? ?Intake/Output Summary (Last 24 hours) at 11/29/2021 0934 ?Last data filed at 11/29/2021 0600 ?Gross per 24 hour  ?Intake 240 ml  ?Output 1750 ml  ?Net -1510 ml  ? ?Filed Weights  ? 11/27/21 0730 11/28/21 0500 11/29/21 0545  ?Weight: 105.2 kg 105.9 kg 106.9 kg  ? ? ?Telemetry  ?  ?Atrial fibrillation.  Personally reviewed. ? ?ECG  ?  ?An ECG dated 11/26/2021 was personally reviewed today and demonstrated:  Atrial fibrillation with RVR, poor R wave progression. ? ?Physical Exam  ? ?GEN: No acute distress.   ?Neck: No JVD. ?Cardiac: Irregularly irregular without gallop.  ?Respiratory: Nonlabored. Clear to auscultation bilaterally. ?GI: Soft, nontender, bowel sounds present. ?MS: Leg edema improved. ?Neuro:  Nonfocal. ?Psych: Alert and oriented x 3. Normal affect. ? ?Labs  ?   ?Chemistry ?Recent Labs  ?Lab 11/26/21 ?2203 11/27/21 ?0424 11/28/21 ?0421 11/29/21 ?0400  ?NA 138 138 139 137  ?K 4.7 4.7 3.3* 3.6  ?CL 110 109 106 105  ?CO2 17* 18* 23 24  ?GLUCOSE 297* 335* 203* 189*  ?BUN 23* 26* 30* 36*  ?CREATININE 1.62* 1.81* 1.94* 1.96*  ?CALCIUM 9.1 9.3 8.8* 9.0  ?PROT 7.9 8.1  --   --   ?ALBUMIN 3.9 4.0  --   --   ?AST 44* 43*  --   --   ?ALT 27 31  --   --   ?ALKPHOS 101 104  --   --   ?BILITOT 1.0 1.0  --   --   ?GFRNONAA 37* 33* 30* 30*  ?ANIONGAP '11 11 10 8  '$ ?  ? ?Hematology ?Recent Labs  ?Lab 11/27/21 ?0424 11/28/21 ?0421 11/29/21 ?0400  ?WBC 10.6* 7.9 8.0  ?RBC 4.20 3.94 4.08  ?HGB 9.6* 8.7* 9.3*  ?HCT 31.7* 29.8* 30.9*  ?MCV 75.5* 75.6* 75.7*  ?MCH 22.9* 22.1* 22.8*  ?MCHC 30.3 29.2* 30.1  ?RDW 15.2 15.2 15.3  ?PLT 361 296 307  ? ? ?Cardiac Enzymes ?Recent Labs  ?Lab 11/26/21 ?2203 11/27/21 ?0037  ?TROPONINIHS 12 13  ? ? ?BNP ?Recent Labs  ?Lab  11/26/21 ?2244  ?BNP 1,106.0*  ?  ? ?Radiology  ?  ?ECHOCARDIOGRAM LIMITED ? ?Result Date: 11/27/2021 ?   ECHOCARDIOGRAM LIMITED REPORT   Patient Name:   DOYLENE SPLINTER Hagberg Date of Exam: 11/27/2021 Medical Rec #:  [id number]      Height:       65.0 in Accession #:    [id number]     Weight:       231.9 lb Date of Birth:  [birth year]     BSA:          2.107 m? Patient Age:    56 years       BP:           165/126 mmHg Patient Gender: F              HR:           109 bpm. Exam Location:  [name] Procedure: Limited Echo Indications:    I50.21 CHF, Assess LVEF  History:        Patient has prior history of Echocardiogram examinations, most                 recent 08/13/2021. CHF, Stroke and COPD, Arrythmias:Atrial                 Fibrillation; Risk Factors:Hypertension and Diabetes. Cocaine                 abuse ([place]), Expressive aphasia (From Hx).  Sonographer:    Alvino Chapel RCS Referring Phys: [id number] [place]  1. Left ventricular ejection fraction, by estimation, is 25 to 30%. The left ventricle has severely decreased function.  The left ventricle demonstrates global hypokinesis. There is mild concentric left ventricular hypertrophy.  2. Right ventricular systolic function is normal. The right ventricular size is normal.  3. Left atrial size was severely dilated.  4. The mitral valve is normal in structure. No evidence of mitral valve regurgitation. No evidence of mitral stenosis.  5. The aortic valve was not well visualized. Aortic valve regurgitation is not visualized. FINDINGS  Left Ventricle: Left ventricular ejection fraction, by estimation, is 25 to 30%. The left ventricle has severely decreased function. The left ventricle demonstrates global hypokinesis. Definity contrast agent was given IV to delineate the left ventricular endocardial borders. The left ventricular internal cavity size was normal in size. There is mild concentric left ventricular hypertrophy. Right Ventricle: The right ventricular size is normal. No increase in right ventricular wall thickness. Right ventricular systolic function is normal. Left Atrium: Left atrial size was severely dilated. Right Atrium: Right atrial size was normal in size. Pericardium: There is no evidence of pericardial effusion. Mitral Valve: The mitral valve is normal in structure. Mild to moderate mitral annular calcification. No evidence of mitral valve stenosis. Tricuspid Valve: The tricuspid valve is normal in structure. Tricuspid valve regurgitation is not demonstrated. No evidence of tricuspid stenosis. Aortic Valve: The aortic valve was not well visualized. Aortic valve regurgitation is not visualized. Pulmonic Valve: The pulmonic valve was normal in structure. Pulmonic valve regurgitation is not visualized. No evidence of pulmonic stenosis. Aorta: The aortic root is normal in size and structure. IAS/Shunts: No atrial level shunt detected by color flow Doppler. LEFT VENTRICLE PLAX 2D LVIDd:         5.00 cm LVIDs:         3.90 cm LV PW:         1.20 cm LV IVS:  1.30 cm LVOT diam:      2.00 cm LVOT Area:     3.14 cm?  LEFT ATRIUM         Index LA diam:    5.00 cm 2.37 cm/m?   AORTA Ao Root diam: 3.50 cm  SHUNTS Systemic Diam: 2.00 cm Fransico Him MD Electronically signed by Fransico Him MD Signature Date/Time: 11/27/2021/6:48:45 PM    Final    ? ?Assessment & Plan  ?  ?1.  HFrEF with history of nonischemic cardiomyopathy, LVEF down to the range of 25 to 30% by recent evaluation.  Suspect component of tachycardia induced cardiomyopathy with persistent atrial fibrillation and RVR.  Additional net urine output of 1500 cc last 24 hours on IV Lasix.  Edema improved. ? ?2.  Persistent atrial fibrillation with CHA2DS2-VASc score of 6.  She is on Eliquis for stroke prophylaxis, also Coreg for heart rate control in addition to cardiomyopathy.  Heart rate control still not optimal. ? ?3.  CKD stage IIIb, creatinine has bumped up to 1.96. ? ?4.  History of stroke with expressive aphasia.  She has had some recent right sided facial numbness as well with work-up per primary team. ? ?5.  Essential hypertension, recent blood pressure is low normal range. ? ?Spoke with patient and her sister by phone this morning.  Plan is to cut back IV Lasix, attempt further up titration of Coreg aimed at heart rate control.  Blood pressure has been stable after decrease in hydralazine dosing.  Otherwise continue Eliquis and Imdur. ? ?Signed, ?Rozann Lesches, MD  ?11/29/2021, 9:34 AM    ?

## 2021-11-29 NOTE — Progress Notes (Addendum)
?PROGRESS NOTE ? ? ? ?Robin [name]  KKX:[id number] DOB: 07/23/1966 DOA: 11/26/2021 ?PCP: Inda Coke, PA ? ? ?Brief Narrative:  ?Robin [name] is a pleasant 56 y.o. female with medical history significant for atrial fibrillation on Eliquis, chronic systolic CHF, CKD 3B, insulin-dependent diabetes mellitus, and history of CVA with residual right-sided weakness and aphasia, now presenting to the emergency department with shortness of breath.  Patient is well-known to the cardiac team here, admitted for acute recurrent systolic heart failure exacerbation and hypoxia.  Initially requiring BiPAP for respiratory support but improving drastically since admission. ? ?Assessment & Plan: ?  ?Principal Problem: ?  Acute on chronic systolic CHF (congestive heart failure) ([place]) ?Active Problems: ?  Type 2 diabetes mellitus with diabetic autonomic neuropathy, with long-term current use of insulin ([place]) ?  Atrial fibrillation with RVR ([place]) ?  History of CVA with residual deficit ?  Stage 3b chronic kidney disease (CKD) ([place]) ?  Microcytic anemia ?  Lactic acidosis ? ?Acute on chronic systolic CHF; acute hypoxic respiratory failure, POA ?-Continue aggressive diuresis and follow cardiology service recommendations. ?-Respiratory status markedly improving, no longer BiPAP dependent, able to wean to room air and maintaining adequate saturation. ?-EF was 40-45% in January; repeat echo demonstrating ejection fraction of 25 to 30% by estimation.  There is global hypokinesis and mild concentric left ventricular hypertrophy. ?-Patient reported to feel short winded sensation with activity; continue to follow daily weights, low-sodium diet and strict intake and output. ?-Cardiology service continue adjusting medication regimen for further diuresis and rate control. ?-Hopefully home in the next 24-48 hours. ?  ?Atrial fibrillation with RVR  ?-Likely provoked secondary to above - rate 140s in ED  ?-Continue Lopressor, Eliquis    ?-Cardiology service further adjusting patient's beta-blocker to achieve better control.  On today's evaluation on telemetry still demonstrating episode of A-fib with RVR. ?  ?Lactic acidosis, resolved ?-Within normal limits, likely secondary to profound volume overload and hypoxia ?-No fever or evidence for infection, she is not hypotensive and extremities are warm ?-Likely from acute pulmonary edema  ?-Continue to follow trend while diuresing  ?  ?Hx of CVA  ?- Residual hemiparesis and aphasia -has stuttering speech, this is her baseline per family at bedside -can answer yes/no questions quite readily however ?-Continue Eliquis for secondary prevention. ?-No new focal deficit appreciated. ?  ?AKI on CKD IIIb  ?-Baseline creatinine around 1.6 currently 1.96 in the setting of volume overload and diuresis. ?-Continue to follow cardiology service recommendation for further adjustment in diuretics. ?-Maintain adequate hydration and follow renal function trend. ?  ?IDDM, profoundly uncontrolled with hyperglycemia ?- A1c was 12.3% in March 2023  ?-Continue sliding scale insulin, hypoglycemic protocol  ?-Modified carbohydrate diet has been encouraged. ?-Will continue added 3 times daily meal coverage NovoLog. ?-Continue to follow CBG fluctuation and further adjust regimen. ? ?Mild hypokalemia ?-In the setting of diuresis ?-Follow electrolytes trend and replete as needed. ?  ?DVT prophylaxis: Eliquis  ?Code Status: Full  ?Family Communication: Son at bedside ? ?Status is: Inpatient ? ?Dispo: The patient is from: Home ?             Anticipated d/c is to: To be determined ?             Anticipated d/c date is: 24-48+ hours ?             Patient currently not medically stable for discharge ? ?Consultants:  ?Cardiology ? ?Procedures:  ?None ? ?Antimicrobials:  ?  None indicated ? ?Subjective: ?Reporting improvement in her breathing; no chest pain, no nausea, no vomiting, no fever.  Still short winded with activity but denies  orthopnea and shortness of breath at rest.  Patient continues to demonstrate A-fib with RVR. ? ?Objective: ?Vitals:  ? 11/28/21 2123 11/29/21 0545 11/29/21 1257 11/29/21 1716  ?BP: 105/63 121/74 99/60 120/71  ?Pulse: 96 91 93   ?Resp: '20 18 17   '$ ?Temp: 97.8 ?F (36.6 ?C) 97.9 ?F (36.6 ?C) 98.6 ?F (37 ?C)   ?TempSrc: Oral  Oral   ?SpO2: 100% 100% 100%   ?Weight:  106.9 kg    ?Height:      ? ? ?Intake/Output Summary (Last 24 hours) at 11/29/2021 1737 ?Last data filed at 11/29/2021 1300 ?Gross per 24 hour  ?Intake 720 ml  ?Output 2450 ml  ?Net -1730 ml  ? ?Filed Weights  ? 11/27/21 0730 11/28/21 0500 11/29/21 0545  ?Weight: 105.2 kg 105.9 kg 106.9 kg  ? ? ?Examination: ?General exam: Alert, awake, oriented x 3; able to follow commands appropriately.  Chronic expressive aphasia appreciated.  Reports no chest pain, no nausea, no vomiting.  Short winded with activity but is denying orthopnea or shortness of breath at rest. ?Respiratory system: Clear to auscultation. Respiratory effort normal.  No using oxygen supplementation. ?Cardiovascular system: Irregular irregular, no rubs, no gallops, unable to properly assess JVD with body habitus. ?Gastrointestinal system: Abdomen is obese, nondistended, soft and nontender. No organomegaly or masses felt. Normal bowel sounds heard. ?Central nervous system: Alert and oriented. No focal neurological deficits. ?Extremities: No cyanosis or clubbing; trace edema appreciated bilaterally. ?Skin: No petechiae. ?Psychiatry: Judgement and insight appear normal. Mood & affect appropriate.  ? ?Data Reviewed: I have personally reviewed following labs and imaging studies ? ?CBC: ?Recent Labs  ?Lab 11/26/21 ?8413 11/26/21 ?2203 11/27/21 ?0424 11/28/21 ?0421 11/29/21 ?0400  ?WBC 7.4 8.8 10.6* 7.9 8.0  ?NEUTROABS 4.5 6.6  --   --   --   ?HGB 8.8* 9.7* 9.6* 8.7* 9.3*  ?HCT 30.1* 32.4* 31.7* 29.8* 30.9*  ?MCV 75.4* 75.7* 75.5* 75.6* 75.7*  ?PLT 327 333 361 296 307  ? ?Basic Metabolic Panel: ?Recent  Labs  ?Lab 11/26/21 ?2440 11/26/21 ?2203 11/26/21 ?2244 11/27/21 ?0424 11/28/21 ?0421 11/29/21 ?0400  ?NA 137 138  --  138 139 137  ?K 4.0 4.7  --  4.7 3.3* 3.6  ?CL 109 110  --  109 106 105  ?CO2 20* 17*  --  18* 23 24  ?GLUCOSE 225* 297*  --  335* 203* 189*  ?BUN 23* 23*  --  26* 30* 36*  ?CREATININE 1.59* 1.62*  --  1.81* 1.94* 1.96*  ?CALCIUM 8.9 9.1  --  9.3 8.8* 9.0  ?MG  --   --  2.2 2.0  --   --   ? ?GFR: ?Estimated Creatinine Clearance: 39.4 mL/min (A) (by C-G formula based on SCr of 1.96 mg/dL (H)). ? ?Liver Function Tests: ?Recent Labs  ?Lab 11/26/21 ?2203 11/27/21 ?0424  ?AST 44* 43*  ?ALT 27 31  ?ALKPHOS 101 104  ?BILITOT 1.0 1.0  ?PROT 7.9 8.1  ?ALBUMIN 3.9 4.0  ? ?Coagulation Profile: ?Recent Labs  ?Lab 11/26/21 ?2203  ?INR 1.4*  ? ?CBG: ?Recent Labs  ?Lab 11/28/21 ?1649 11/28/21 ?2125 11/29/21 ?1027 11/29/21 ?1120 11/29/21 ?1658  ?GLUCAP 272* 275* 221* 286* 273*  ? ?Sepsis Labs: ?Recent Labs  ?Lab 11/26/21 ?2203 11/27/21 ?0037 11/27/21 ?2536  ?LATICACIDVEN 3.1* 3.6* 1.7  ? ? ?Recent  Results (from the past 240 hour(s))  ?Resp Panel by RT-PCR (Flu A&B, Covid) Nasopharyngeal Swab     Status: None  ? Collection Time: 11/26/21  9:34 AM  ? Specimen: Nasopharyngeal Swab; Nasopharyngeal(NP) swabs in vial transport medium  ?Result Value Ref Range Status  ? SARS Coronavirus 2 by RT PCR NEGATIVE NEGATIVE Final  ?  Comment: (NOTE) ?SARS-CoV-2 target nucleic acids are NOT DETECTED. ? ?The SARS-CoV-2 RNA is generally detectable in upper respiratory ?specimens during the acute phase of infection. The lowest ?concentration of SARS-CoV-2 viral copies this assay can detect is ?138 copies/mL. A negative result does not preclude SARS-Cov-2 ?infection and should not be used as the sole basis for treatment or ?other patient management decisions. A negative result may occur with  ?improper specimen collection/handling, submission of specimen other ?than nasopharyngeal swab, presence of viral mutation(s) within the ?areas  targeted by this assay, and inadequate number of viral ?copies(<138 copies/mL). A negative result must be combined with ?clinical observations, patient history, and epidemiological ?information. The expected result

## 2021-11-30 DIAGNOSIS — I4891 Unspecified atrial fibrillation: Secondary | ICD-10-CM | POA: Diagnosis not present

## 2021-11-30 DIAGNOSIS — I4819 Other persistent atrial fibrillation: Secondary | ICD-10-CM | POA: Diagnosis not present

## 2021-11-30 DIAGNOSIS — I5023 Acute on chronic systolic (congestive) heart failure: Secondary | ICD-10-CM | POA: Diagnosis not present

## 2021-11-30 DIAGNOSIS — I5020 Unspecified systolic (congestive) heart failure: Secondary | ICD-10-CM | POA: Diagnosis not present

## 2021-11-30 LAB — BASIC METABOLIC PANEL
Anion gap: 11 (ref 5–15)
BUN: 35 mg/dL — ABNORMAL HIGH (ref 6–20)
CO2: 22 mmol/L (ref 22–32)
Calcium: 9.3 mg/dL (ref 8.9–10.3)
Chloride: 104 mmol/L (ref 98–111)
Creatinine, Ser: 1.82 mg/dL — ABNORMAL HIGH (ref 0.44–1.00)
GFR, Estimated: 32 mL/min — ABNORMAL LOW (ref >60)
Glucose, Bld: 164 mg/dL — ABNORMAL HIGH (ref 70–99)
Potassium: 3.5 mmol/L (ref 3.5–5.1)
Sodium: 137 mmol/L (ref 135–145)

## 2021-11-30 LAB — GLUCOSE, CAPILLARY
Glucose-Capillary: 173 mg/dL — ABNORMAL HIGH (ref 70–99)
Glucose-Capillary: 266 mg/dL — ABNORMAL HIGH (ref 70–99)
Glucose-Capillary: 224 mg/dL — ABNORMAL HIGH (ref 70–99)
Glucose-Capillary: 229 mg/dL — ABNORMAL HIGH (ref 70–99)

## 2021-11-30 MED ORDER — SODIUM CHLORIDE 0.9 % IV SOLN
Freq: Once | INTRAVENOUS | Status: AC
Start: 2021-11-30 — End: 2021-11-30

## 2021-11-30 NOTE — Progress Notes (Signed)
? ?Progress Note ? ?Patient Name: Robin [name] ?Date of Encounter: 11/30/2021 ? ?Primary Cardiologist: Carlyle Dolly, MD ? ?Subjective  ? ?Reporting mild intermittent leg discomfort, absent this morning.  Breathing improved overall and no palpitations or chest pain. ? ?Inpatient Medications  ?  ?Scheduled Meds: ? apixaban  5 mg Oral BID  ? carvedilol  50 mg Oral BID WC  ? gabapentin  200 mg Oral TID  ? hydrALAZINE  25 mg Oral TID  ? insulin aspart  0-5 Units Subcutaneous QHS  ? insulin aspart  0-9 Units Subcutaneous TID WC  ? insulin aspart  5 Units Subcutaneous TID WC  ? insulin glargine-yfgn  50 Units Subcutaneous Daily  ? isosorbide mononitrate  60 mg Oral Daily  ? pantoprazole  40 mg Oral Daily  ? sodium chloride flush  3 mL Intravenous Q12H  ? ? ?PRN Meds: ?acetaminophen **OR** acetaminophen, metoCLOPramide, metoprolol tartrate  ? ?Vital Signs  ?  ?Vitals:  ? 11/29/21 1257 11/29/21 1716 11/29/21 2223 11/30/21 0459  ?BP: 99/60 120/71 95/65 115/83  ?Pulse: 93  92 93  ?Resp: _0 ?Temp: 98.6 ?F (37 ?C)  97.8 ?F (36.6 ?C) 97.9 ?F (36.6 ?C)  ?TempSrc: Oral  Oral   ?SpO2: 100%  99% 100%  ?Weight:    107.1 kg  ?Height:      ? ? ?Intake/Output Summary (Last 24 hours) at 11/30/2021 1011 ?Last data filed at 11/30/2021 0500 ?Gross per 24 hour  ?Intake 720 ml  ?Output 2100 ml  ?Net -1380 ml  ? ?Filed Weights  ? 11/28/21 0500 11/29/21 0545 11/30/21 0459  ?Weight: 105.9 kg 106.9 kg 107.1 kg  ? ? ?Telemetry  ?  ?Atrial fibrillation with better rate control, 90s at rest this morning.  Personally reviewed. ? ?ECG  ?  ?An ECG dated 11/26/2021 was personally reviewed today and demonstrated:  Atrial fibrillation with RVR, poor R wave progression. ? ?Physical Exam  ? ?GEN: No acute distress.   ?Neck: No JVD. ?Cardiac: Irregularly irregular without gallop.  ?Respiratory: Nonlabored. Clear to auscultation bilaterally. ?GI: Soft, nontender, bowel sounds present. ?MS: Leg edema improved. ?Neuro:  Nonfocal. ?Psych: Alert and  oriented x 3. Normal affect. ? ?Labs  ?  ?Chemistry ?Recent Labs  ?Lab 11/26/21 ?2203 11/27/21 ?0424 11/28/21 ?0421 11/29/21 ?0400 11/30/21 ?0429  ?NA 138 138 139 137 137  ?K 4.7 4.7 3.3* 3.6 3.5  ?CL 110 109 106 105 104  ?CO2 17* 18* _1 ?GLUCOSE 297* 335* 203* 189* 164*  ?BUN 23* 26* 30* 36* 35*  ?CREATININE 1.62* 1.81* 1.94* 1.96* 1.82*  ?CALCIUM 9.1 9.3 8.8* 9.0 9.3  ?PROT 7.9 8.1  --   --   --   ?ALBUMIN 3.9 4.0  --   --   --   ?AST 44* 43*  --   --   --   ?ALT 27 31  --   --   --   ?ALKPHOS 101 104  --   --   --   ?BILITOT 1.0 1.0  --   --   --   ?GFRNONAA 37* 33* 30* 30* 32*  ?ANIONGAP _2 ?  ? ?Hematology ?Recent Labs  ?Lab 11/27/21 ?0424 11/28/21 ?0421 11/29/21 ?0400  ?WBC 10.6* 7.9 8.0  ?RBC 4.20 3.94 4.08  ?HGB 9.6* 8.7* 9.3*  ?HCT 31.7* 29.8* 30.9*  ?MCV 75.5* 75.6* 75.7*  ?MCH 22.9* 22.1* 22.8*  ?MCHC 30.3 29.2* 30.1  ?RDW 15.2  15.2 15.3  ?PLT 361 296 307  ? ? ?Cardiac Enzymes ?Recent Labs  ?Lab 11/26/21 ?2203 11/27/21 ?0037  ?TROPONINIHS 12 13  ? ? ?BNP ?Recent Labs  ?Lab 11/26/21 ?2244  ?BNP 1,106.0*  ?  ? ?Radiology  ?  ?No results found. ? ?Assessment & Plan  ?  ?1.  HFrEF with history of nonischemic cardiomyopathy, LVEF down to the range of 25 to 30% by recent evaluation.  Suspect component of tachycardia induced cardiomyopathy with persistent atrial fibrillation and RVR.  Additional net urine output of 1800 cc last 24 hours, Lasix held. ? ?2.  Persistent atrial fibrillation with CHA2DS2-VASc score of 6.  She is on Eliquis for stroke prophylaxis, also Coreg for heart rate control in addition to cardiomyopathy.  Coreg dose was increased to 50 mg twice daily yesterday. ? ?3.  CKD stage IIIb, creatinine has bumped up to 1.82. ? ?4.  History of stroke with expressive aphasia. ? ?5.  Essential hypertension, recent blood pressure is low normal range. ? ?Met with patient and spoke to her sister by phone this morning.  Heart rate control is generally better, would see how she does on Coreg  50 mg twice daily today, increase activity.  Goal is heart rate under 100 at rest.  Hold Lasix again today.  Continue Eliquis, hydralazine, and Imdur otherwise.  Anticipate discharge home tomorrow, we will schedule outpatient follow-up for her.  As far as outpatient diuretic would start back on Lasix 20 mg daily for now and encourage daily weights for titration. ? ?Signed, ?Rozann Lesches, MD  ?11/30/2021, 10:11 AM    ?

## 2021-11-30 NOTE — Progress Notes (Signed)
Patient is currently on RA.  No distress noted at this time.  Bipap order is PRN. ?

## 2021-11-30 NOTE — Progress Notes (Signed)
?PROGRESS NOTE ? ? ? ?Robin [name]  KGU:[id number] DOB: 1965/10/16 DOA: 11/26/2021 ?PCP: Inda Coke, PA ? ? ?Brief Narrative:  ?Robin [name] is a pleasant 56 y.o. female with medical history significant for atrial fibrillation on Eliquis, chronic systolic CHF, CKD 3B, insulin-dependent diabetes mellitus, and history of CVA with residual right-sided weakness and aphasia, now presenting to the emergency department with shortness of breath.  Patient is well-known to the cardiac team here, admitted for acute recurrent systolic heart failure exacerbation and hypoxia.  Initially requiring BiPAP for respiratory support but improving drastically since admission. ? ?Assessment & Plan: ?  ?Principal Problem: ?  Acute on chronic systolic CHF (congestive heart failure) ([place]) ?Active Problems: ?  Type 2 diabetes mellitus with diabetic autonomic neuropathy, with long-term current use of insulin ([place]) ?  Atrial fibrillation with RVR ([place]) ?  History of CVA with residual deficit ?  Stage 3b chronic kidney disease (CKD) ([place]) ?  Microcytic anemia ?  Lactic acidosis ? ?Acute on chronic systolic CHF; acute hypoxic respiratory failure, POA ?-Continue aggressive diuresis and follow cardiology service recommendations. ?-Respiratory status markedly improving, no longer BiPAP dependent, able to wean to room air and maintaining adequate saturation. ?-EF was 40-45% in January; repeat echo demonstrating ejection fraction of 25 to 30% by estimation.  There is global hypokinesis and mild concentric left ventricular hypertrophy. ?-Patient reported to feel short winded sensation with activity; continue to follow daily weights, low-sodium diet and strict intake and output. ?-Holding diuretics today; planning to resume Lasix 20 mg daily at time of discharge per cardiology recommendations. ?-Hopefully home in the next 24 hours. ?  ?Atrial fibrillation with RVR  ?-Likely provoked secondary to above - rate 140s in ED  ?-Continue Lopressor,  Eliquis   ?-Cardiology service further adjusting patient's beta-blocker to achieve better control.  On today's evaluation on telemetry patient continued to be on atrial fibrillation without the presence of rapid ventricular rate; goal is for heart rate less than 100 at rest. ?-Cardiac adjusted to 50 mg. ?  ?Lactic acidosis, resolved ?-Within normal limits, likely secondary to profound volume overload and hypoxia ?-No fever or evidence for infection, she is not hypotensive and extremities are warm ?-Likely from acute pulmonary edema  ?-Continue to follow trend while diuresing  ?  ?Hx of CVA  ?- Residual hemiparesis and aphasia -has stuttering speech, this is her baseline per family at bedside -can answer yes/no questions quite readily however ?-Continue Eliquis for secondary prevention. ?-No new focal deficit appreciated. ?  ?AKI on CKD IIIb  ?-Baseline creatinine around 1.6 currently 1.96 in the setting of volume overload and diuresis. ?-We will continue to follow cardiology service recommendation. ?-Continue to maintain adequate hydration and follow renal function trend. ?-Holding diuretics for now. ?  ?IDDM, profoundly uncontrolled with hyperglycemia ?- A1c was 12.3% in March 2023  ?-Continue sliding scale insulin, hypoglycemic protocol  ?-Modified carbohydrate diet has been discussed with patient. ?-Will continue added 3 times daily meal coverage NovoLog. ?-Continue to follow CBG fluctuation and further adjust regimen. ? ?Mild hypokalemia ?-In the setting of diuresis ?-Follow electrolytes trend and further replete as needed. ?  ?DVT prophylaxis: Eliquis  ?Code Status: Full  ?Family Communication: Son at bedside ? ?Status is: Inpatient ? ?Dispo: The patient is from: Home ?             Anticipated d/c is to: To be determined ?             Anticipated d/c date is:  24-48+ hours ?             Patient currently not medically stable for discharge ? ?Consultants:  ?Cardiology ? ?Procedures:  ?None ? ?Antimicrobials:   ?None indicated ? ?Subjective: ?Patient has remained on atrial fibrillation with a rate controlled; reports no chest pain, no nausea, no vomiting, no shortness of breath at rest.  Good urine output reported. ? ?Objective: ?Vitals:  ? 11/29/21 1257 11/29/21 1716 11/29/21 2223 11/30/21 0459  ?BP: 99/60 120/71 95/65 115/83  ?Pulse: 93  92 93  ?Resp: '17  17 18  '$ ?Temp: 98.6 ?F (37 ?C)  97.8 ?F (36.6 ?C) 97.9 ?F (36.6 ?C)  ?TempSrc: Oral  Oral   ?SpO2: 100%  99% 100%  ?Weight:    107.1 kg  ?Height:      ? ? ?Intake/Output Summary (Last 24 hours) at 11/30/2021 1123 ?Last data filed at 11/30/2021 0900 ?Gross per 24 hour  ?Intake 1080 ml  ?Output 2100 ml  ?Net -1020 ml  ? ?Filed Weights  ? 11/28/21 0500 11/29/21 0545 11/30/21 0459  ?Weight: 105.9 kg 106.9 kg 107.1 kg  ? ? ?Examination: ?General exam: Alert, awake, oriented x 3; patient reported overnight intermittent leg pains.  Continue to have good urine output.  No chest pain, no nausea, no vomiting, no requiring oxygen supplementation. ?Respiratory system: Improved air movement bilaterally, no wheezing, no frank crackles; no using accessory muscle.  Good saturation on room air. ?Cardiovascular system: Irregular irregular, no rubs, no gallops, unable to properly assess JVD with body habitus. ?Gastrointestinal system: Abdomen is obese, nondistended, soft and nontender. No organomegaly or masses felt. Normal bowel sounds heard. ?Central nervous system: Alert and oriented. No new focal neurological deficits. ?Extremities: No cyanosis or clubbing; trace pedal edema appreciated on exam. ?Skin: No petechiae. ?Psychiatry: Judgement and insight appear normal. Mood & affect appropriate.  ? ?Data Reviewed: I have personally reviewed following labs and imaging studies ? ?CBC: ?Recent Labs  ?Lab 11/26/21 ?9702 11/26/21 ?2203 11/27/21 ?0424 11/28/21 ?0421 11/29/21 ?0400  ?WBC 7.4 8.8 10.6* 7.9 8.0  ?NEUTROABS 4.5 6.6  --   --   --   ?HGB 8.8* 9.7* 9.6* 8.7* 9.3*  ?HCT 30.1* 32.4* 31.7*  29.8* 30.9*  ?MCV 75.4* 75.7* 75.5* 75.6* 75.7*  ?PLT 327 333 361 296 307  ? ?Basic Metabolic Panel: ?Recent Labs  ?Lab 11/26/21 ?2203 11/26/21 ?2244 11/27/21 ?0424 11/28/21 ?0421 11/29/21 ?0400 11/30/21 ?0429  ?NA 138  --  138 139 137 137  ?K 4.7  --  4.7 3.3* 3.6 3.5  ?CL 110  --  109 106 105 104  ?CO2 17*  --  18* '23 24 22  '$ ?GLUCOSE 297*  --  335* 203* 189* 164*  ?BUN 23*  --  26* 30* 36* 35*  ?CREATININE 1.62*  --  1.81* 1.94* 1.96* 1.82*  ?CALCIUM 9.1  --  9.3 8.8* 9.0 9.3  ?MG  --  2.2 2.0  --   --   --   ? ?GFR: ?Estimated Creatinine Clearance: 42.5 mL/min (A) (by C-G formula based on SCr of 1.82 mg/dL (H)). ? ?Liver Function Tests: ?Recent Labs  ?Lab 11/26/21 ?2203 11/27/21 ?0424  ?AST 44* 43*  ?ALT 27 31  ?ALKPHOS 101 104  ?BILITOT 1.0 1.0  ?PROT 7.9 8.1  ?ALBUMIN 3.9 4.0  ? ?Coagulation Profile: ?Recent Labs  ?Lab 11/26/21 ?2203  ?INR 1.4*  ? ?CBG: ?Recent Labs  ?Lab 11/29/21 ?1120 11/29/21 ?1658 11/29/21 ?2221 11/30/21 ?0726 11/30/21 ?1110  ?GLUCAP 286*  273* 226* 173* 266*  ? ?Sepsis Labs: ?Recent Labs  ?Lab 11/26/21 ?2203 11/27/21 ?0037 11/27/21 ?7893  ?LATICACIDVEN 3.1* 3.6* 1.7  ? ? ?Recent Results (from the past 240 hour(s))  ?Resp Panel by RT-PCR (Flu A&B, Covid) Nasopharyngeal Swab     Status: None  ? Collection Time: 11/26/21  9:34 AM  ? Specimen: Nasopharyngeal Swab; Nasopharyngeal(NP) swabs in vial transport medium  ?Result Value Ref Range Status  ? SARS Coronavirus 2 by RT PCR NEGATIVE NEGATIVE Final  ?  Comment: (NOTE) ?SARS-CoV-2 target nucleic acids are NOT DETECTED. ? ?The SARS-CoV-2 RNA is generally detectable in upper respiratory ?specimens during the acute phase of infection. The lowest ?concentration of SARS-CoV-2 viral copies this assay can detect is ?138 copies/mL. A negative result does not preclude SARS-Cov-2 ?infection and should not be used as the sole basis for treatment or ?other patient management decisions. A negative result may occur with  ?improper specimen  collection/handling, submission of specimen other ?than nasopharyngeal swab, presence of viral mutation(s) within the ?areas targeted by this assay, and inadequate number of viral ?copies(<138 copies/mL). A negative result

## 2021-12-01 DIAGNOSIS — E872 Acidosis, unspecified: Secondary | ICD-10-CM | POA: Diagnosis not present

## 2021-12-01 DIAGNOSIS — I6930 Unspecified sequelae of cerebral infarction: Secondary | ICD-10-CM | POA: Diagnosis not present

## 2021-12-01 DIAGNOSIS — I4891 Unspecified atrial fibrillation: Secondary | ICD-10-CM | POA: Diagnosis not present

## 2021-12-01 DIAGNOSIS — I5023 Acute on chronic systolic (congestive) heart failure: Secondary | ICD-10-CM | POA: Diagnosis not present

## 2021-12-01 LAB — GLUCOSE, CAPILLARY
Glucose-Capillary: 170 mg/dL — ABNORMAL HIGH (ref 70–99)
Glucose-Capillary: 246 mg/dL — ABNORMAL HIGH (ref 70–99)

## 2021-12-01 MED ORDER — CARVEDILOL 25 MG PO TABS: 50.0000 mg | ORAL_TABLET | Freq: Two times a day (BID) | ORAL | 2 refills | Status: DC

## 2021-12-01 NOTE — Progress Notes (Signed)
Ng Discharge Note ? ?Admit Date:  11/26/2021 ?Discharge date: 12/01/2021 ?  ?Robin [name] to be D/C'd Home per MD order.  AVS completed. ?Patient/caregiver able to verbalize understanding. ? ?Discharge Medication: ?Allergies as of 12/01/2021   ? ?   Reactions  ? Bee Venom Shortness Of Breath, Swelling  ? Bodily Swelling  ? Losartan Other (See Comments)  ? Nosebleeds per patient report.   ? Naproxen Other (See Comments)  ? Acid reflux  ? Penicillins Nausea Only  ? Has patient had a PCN reaction causing immediate rash, facial/tongue/throat swelling, SOB or lightheadedness with hypotension: no ?Has patient had a PCN reaction causing severe rash involving mucus membranes or skin necrosis: no ?Has patient had a PCN reaction that required hospitalization no ?Has patient had a PCN reaction occurring within the last 10 years: no ?If all of the above answers are "NO", then may proceed with Cephalosporin   ? Lisinopril   ? Sinus congestion  ? ?  ? ?  ?Medication List  ?  ? ?TAKE these medications   ? ?Accu-Chek Guide Me w/Device Kit ?1 Device by Does not apply route 3 (three) times daily. ?  ?Accu-Chek Guide test strip ?Generic drug: glucose blood ?1 each by Other route 3 (three) times daily. Use as instructed ?  ?Accu-Chek Softclix Lancets lancets ?Use as instructed ?  ?aspirin EC 81 MG tablet ?Take 1 tablet (81 mg total) by mouth daily. Swallow whole. ?  ?BD Pen Needle Nano U/F 32G X 4 MM Misc ?Generic drug: Insulin Pen Needle ?Inject 1 Device into the skin in the morning, at noon, in the evening, and at bedtime. ?  ?carvedilol 25 MG tablet ?Commonly known as: COREG ?Take 2 tablets (50 mg total) by mouth 2 (two) times daily with a meal. ?What changed: how much to take ?  ?Eliquis 5 MG Tabs tablet ?Generic drug: apixaban ?Take 1 tablet by mouth twice daily ?What changed: how much to take ?  ?furosemide 20 MG tablet ?Commonly known as: Lasix ?Take 20 mg daily. May take additional 20 mg if weight gain is over 2 lbs overnight,  or 5 lbs in 1 week ?  ?gabapentin 100 MG capsule ?Commonly known as: NEURONTIN ?TAKE 2 CAPSULES BY MOUTH THREE TIMES DAILY ?  ?hydrALAZINE 25 MG tablet ?Commonly known as: APRESOLINE ?TAKE 1 TABLET BY MOUTH THREE TIMES DAILY ?  ?isosorbide mononitrate 30 MG 24 hr tablet ?Commonly known as: IMDUR ?Take 2 tablets by mouth once daily ?  ?Lantus SoloStar 100 UNIT/ML Solostar Pen ?Generic drug: insulin glargine ?Inject 40 Units into the skin daily. ?  ?metoCLOPramide 10 MG tablet ?Commonly known as: REGLAN ?Take 1 tablet (10 mg total) by mouth every 6 (six) hours as needed for nausea. ?  ?nitroGLYCERIN 0.4 MG SL tablet ?Commonly known as: NITROSTAT ?Place 1 tablet (0.4 mg total) under the tongue every 5 (five) minutes as needed for chest pain. ?  ?NovoLOG FlexPen 100 UNIT/ML FlexPen ?Generic drug: insulin aspart ?Inject 12 Units into the skin 3 (three) times daily with meals. ?  ?pantoprazole 40 MG tablet ?Commonly known as: PROTONIX ?Take 1 tablet (40 mg total) by mouth daily. ?  ?Trulicity 1.5 VA/9.1BT Sopn ?Generic drug: Dulaglutide ?Inject 1.5 mg into the skin once a week. ?  ? ?  ? ? ?Discharge Assessment: ?Vitals:  ? 11/30/21 2105 12/01/21 0510  ?BP: 115/82 104/68  ?Pulse: 95 91  ?Resp: 18 18  ?Temp: 98 ?F (36.7 ?C) 97.9 ?F (36.6 ?C)  ?  SpO2: 100% 99%  ? Skin clean, dry and intact without evidence of skin break down, no evidence of skin tears noted. ?IV catheter discontinued intact. Site without signs and symptoms of complications - no redness or edema noted at insertion site, patient denies c/o pain - only slight tenderness at site.  Dressing with slight pressure applied. ? ?D/c Instructions-Education: ?Discharge instructions given to patient/family with verbalized understanding. ?D/c education completed with patient/family including follow up instructions, medication list, d/c activities limitations if indicated, with other d/c instructions as indicated by MD - patient able to verbalize understanding, all  questions fully answered. ?Patient instructed to return to ED, call 911, or call MD for any changes in condition.  ?Patient escorted via [place], and D/C home via private auto. ? ?Tsosie Billing, LPN ?4/73/4037 09:64 AM   ?

## 2021-12-01 NOTE — Discharge Summary (Signed)
?Physician Discharge Summary ?  ?Patient: Robin [name] MRN: [id number] DOB: Mar 05, 1966  ?Admit date:     11/26/2021  ?Discharge date: 12/01/21  ?Discharge Physician: Barton Dubois  ? ?PCP: Inda Coke, PA  ? ?Recommendations at discharge:  ?Repeat basic metabolic panel to follow electrolytes and renal function ?Reassess blood pressure and adjust antihypertensive treatment as needed ?Continue to assist patient with weight management ?Follow-up patient volume status and further adjust diuretics regimen. ?Make sure patient has follow-up with cardiology service as instructed. ? ?Discharge Diagnoses: ?Principal Problem: ?  Acute on chronic systolic CHF (congestive heart failure) ([place]) ?Active Problems: ?  Type 2 diabetes mellitus with diabetic autonomic neuropathy, with long-term current use of insulin ([place]) ?  Atrial fibrillation with RVR ([place]) ?  History of CVA with residual deficit ?  Stage 3b chronic kidney disease (CKD) ([place]) ?  Microcytic anemia ?  Lactic acidosis ? ? ?Hospital Course: ?Robin [name] is a pleasant 56 y.o. female with medical history significant for atrial fibrillation on Eliquis, chronic systolic CHF, CKD 3B, insulin-dependent diabetes mellitus, and history of CVA with residual right-sided weakness and aphasia, now presenting to the emergency department with shortness of breath.  Patient is well-known to the cardiac team here, admitted for acute recurrent systolic heart failure exacerbation and hypoxia.  Initially requiring BiPAP for respiratory support but improving drastically since admission. ?  ? ?Assessment and Plan: ?Acute on chronic systolic CHF; acute hypoxic respiratory failure, POA ?-Continue aggressive diuresis and follow cardiology service recommendations. ?-Respiratory status markedly improving, no longer BiPAP dependent, able to wean to room air and maintaining adequate saturation. ?-EF was 40-45% in January; repeat echo demonstrating ejection fraction of 25 to 30% by  estimation.  There is global hypokinesis and mild concentric left ventricular hypertrophy. ?-Patient reported to feel short winded sensation with activity; continue to follow daily weights, low-sodium diet and strict intake and output. ?-planning to resume Lasix 20 mg daily at time of discharge per cardiology recommendations. ?-Continue outpatient follow-up with cardiology service to further adjust medical management. ?  ?Atrial fibrillation with RVR  ?-Likely provoked secondary to above - rate 140s in ED  ?-Continue Lopressor, Eliquis   ?-Cardiology service further adjusting patient's beta-blocker to achieve better control.  On today's evaluation on telemetry patient continued to be on atrial fibrillation without the presence of rapid ventricular rate; goal is for heart rate less than 100 at rest. ?-Cardiac adjusted carvedilol to 50 mg twice daily. ?  ?Lactic acidosis, resolved ?-Within normal limits, likely secondary to profound volume overload and hypoxia ?-No fever or evidence for infection, she is not hypotensive and extremities are warm ?-Likely from acute pulmonary edema  ?  ?Hx of CVA  ?- Residual hemiparesis and aphasia -has stuttering speech, this is her baseline per family at bedside -can answer yes/no questions quite readily however ?-Continue Eliquis for secondary prevention. ?-No new focal deficit appreciated. ?  ?AKI on CKD IIIb  ?-Baseline creatinine around 1.6, up to 1.96 at time of admission in the setting of volume overload and diuresis. ?-Continue to follow low-sodium diet and maintain adequate hydration. ?-Following recommendation by cardiology service safe to resume 20 mg of Lasix on daily basis. ?-Renal function essentially back to baseline at time of discharge; creatinine 1.8. ?  ?IDDM, profoundly uncontrolled with hyperglycemia; chronic nephropathy and neuropathy ?- A1c was 12.3% in March 2023  ?-Continue follow-up with PCP for further adjustment to hypoglycemic regimen as needed. ?-Patient  advised to follow modified carbohydrate diet. ?-Maintain adequate  hydration. ?-Continue management for neuropathy. ?  ?Mild hypokalemia ?-In the setting of diuresis ?-Continue to follow electrolytes trend and further replete as needed. ? ?Morbid obesity ?Body mass index is 38.19 kg/m?. ?-Low calorie diet, portion control and increase physical activity discussed with patient. ? ?Consultants: Cardiology service ?Procedures performed: See below for x-ray report; 2D echo demonstrating diffuse hypokinesis and ejection fraction 25 to 30% currently.  No significant valvular disorder appreciated. ?Disposition: Home ?Diet recommendation: Cardiac and Carb modified diet ? ?DISCHARGE MEDICATION: ?Allergies as of 12/01/2021   ? ?   Reactions  ? Bee Venom Shortness Of Breath, Swelling  ? Bodily Swelling  ? Losartan Other (See Comments)  ? Nosebleeds per patient report.   ? Naproxen Other (See Comments)  ? Acid reflux  ? Penicillins Nausea Only  ? Has patient had a PCN reaction causing immediate rash, facial/tongue/throat swelling, SOB or lightheadedness with hypotension: no ?Has patient had a PCN reaction causing severe rash involving mucus membranes or skin necrosis: no ?Has patient had a PCN reaction that required hospitalization no ?Has patient had a PCN reaction occurring within the last 10 years: no ?If all of the above answers are "NO", then may proceed with Cephalosporin   ? Lisinopril   ? Sinus congestion  ? ?  ? ?  ?Medication List  ?  ? ?TAKE these medications   ? ?Accu-Chek Guide Me w/Device Kit ?1 Device by Does not apply route 3 (three) times daily. ?  ?Accu-Chek Guide test strip ?Generic drug: glucose blood ?1 each by Other route 3 (three) times daily. Use as instructed ?  ?Accu-Chek Softclix Lancets lancets ?Use as instructed ?  ?aspirin EC 81 MG tablet ?Take 1 tablet (81 mg total) by mouth daily. Swallow whole. ?  ?BD Pen Needle Nano U/F 32G X 4 MM Misc ?Generic drug: Insulin Pen Needle ?Inject 1 Device into the  skin in the morning, at noon, in the evening, and at bedtime. ?  ?carvedilol 25 MG tablet ?Commonly known as: COREG ?Take 2 tablets (50 mg total) by mouth 2 (two) times daily with a meal. ?What changed: how much to take ?  ?Eliquis 5 MG Tabs tablet ?Generic drug: apixaban ?Take 1 tablet by mouth twice daily ?What changed: how much to take ?  ?furosemide 20 MG tablet ?Commonly known as: Lasix ?Take 20 mg daily. May take additional 20 mg if weight gain is over 2 lbs overnight, or 5 lbs in 1 week ?  ?gabapentin 100 MG capsule ?Commonly known as: NEURONTIN ?TAKE 2 CAPSULES BY MOUTH THREE TIMES DAILY ?  ?hydrALAZINE 25 MG tablet ?Commonly known as: APRESOLINE ?TAKE 1 TABLET BY MOUTH THREE TIMES DAILY ?  ?isosorbide mononitrate 30 MG 24 hr tablet ?Commonly known as: IMDUR ?Take 2 tablets by mouth once daily ?  ?Lantus SoloStar 100 UNIT/ML Solostar Pen ?Generic drug: insulin glargine ?Inject 40 Units into the skin daily. ?  ?metoCLOPramide 10 MG tablet ?Commonly known as: REGLAN ?Take 1 tablet (10 mg total) by mouth every 6 (six) hours as needed for nausea. ?  ?nitroGLYCERIN 0.4 MG SL tablet ?Commonly known as: NITROSTAT ?Place 1 tablet (0.4 mg total) under the tongue every 5 (five) minutes as needed for chest pain. ?  ?NovoLOG FlexPen 100 UNIT/ML FlexPen ?Generic drug: insulin aspart ?Inject 12 Units into the skin 3 (three) times daily with meals. ?  ?pantoprazole 40 MG tablet ?Commonly known as: PROTONIX ?Take 1 tablet (40 mg total) by mouth daily. ?  ?Trulicity 1.5 QX/4.5WT  Sopn ?Generic drug: Dulaglutide ?Inject 1.5 mg into the skin once a week. ?  ? ?  ? ? Follow-up Information   ? ? [name], PA-C Follow up on 12/21/2021.   ?Specialties: Physician Assistant, Cardiology ?Why: Cardiology Hospital Follow-up on 12/21/2021 at 3:30 PM. ?Contact information: ?[address] ?[place] [address] ?[phone] ? ? ?  ?  ? ? Inda Coke, [place]. Schedule an appointment as soon as possible for a visit in 10 day(s).    ?Specialty: Physician Assistant ?Contact information: ?[place] ?[place] [address] ?[phone] ? ? ?  ?  ? ? Arnoldo Lenis, MD .   ?Specialty: Cardiology ?Contact information: ?[place]

## 2021-12-03 ENCOUNTER — Telehealth: Payer: Self-pay

## 2021-12-03 NOTE — Telephone Encounter (Signed)
Transition Care Management Follow-up Telephone Call ?Date of discharge and from where: [facility] 12/01/21 ?How have you been since you were released from the hospital? Doing better ?Any questions or concerns? No ? ?Items Reviewed: ?Did the pt receive and understand the discharge instructions provided? Yes  ?Medications obtained and verified? Yes  ?Other? No  ?Any new allergies since your discharge? No  ?Dietary orders reviewed? Yes ?Do you have support at home? Yes  ? ?Home Care and Equipment/Supplies: ?Were home health services ordered? not applicable ?If so, what is the name of the agency?   ?Has the agency set up a time to come to the patient's home? not applicable ?Were any new equipment or medical supplies ordered?  No ?What is the name of the medical supply agency?  ?Were you able to get the supplies/equipment? not applicable ?Do you have any questions related to the use of the equipment or supplies? No ? ?Functional Questionnaire: (I = Independent and D = Dependent) ?ADLs: I ? ?Bathing/Dressing- I ? ?Meal Prep- I ? ?Eating- I ? ?Maintaining continence- I ? ?Transferring/Ambulation- I USES A CANE  ? ?Managing Meds- I assistance as needed for all ADL  ? ?Follow up appointments reviewed: ? ?PCP Hospital f/u appt confirmed? No   ?Specialist Hospital f/u appt confirmed? Yes  Scheduled to see Dr Ahmed Prima  on 12/21/21 @ 3:30. ?Are transportation arrangements needed? No  ?If their condition worsens, is the pt aware to call PCP or go to the Emergency Dept.? Yes ?Was the patient provided with contact information for the PCP's office or ED? Yes ?Was to pt encouraged to call back with questions or concerns? Yes ? ?

## 2021-12-18 ENCOUNTER — Ambulatory Visit (INDEPENDENT_AMBULATORY_CARE_PROVIDER_SITE_OTHER): Payer: Medicaid Other | Admitting: Physician Assistant

## 2021-12-18 ENCOUNTER — Encounter: Payer: Self-pay | Admitting: Physician Assistant

## 2021-12-18 ENCOUNTER — Other Ambulatory Visit (HOSPITAL_COMMUNITY)
Admission: RE | Admit: 2021-12-18 | Discharge: 2021-12-18 | Disposition: A | Discharge status: Home / Self Care | Payer: Medicaid Other | Source: Ambulatory Visit | Attending: Physician Assistant | Admitting: Physician Assistant

## 2021-12-18 VITALS — BP 126/70 | HR 81 | Temp 98.1°F | Ht 65.0 in | Wt 236.2 lb

## 2021-12-18 DIAGNOSIS — R300 Dysuria: Secondary | ICD-10-CM | POA: Insufficient documentation

## 2021-12-18 DIAGNOSIS — Z113 Encounter for screening for infections with a predominantly sexual mode of transmission: Secondary | ICD-10-CM

## 2021-12-18 DIAGNOSIS — N898 Other specified noninflammatory disorders of vagina: Secondary | ICD-10-CM | POA: Diagnosis not present

## 2021-12-18 LAB — POCT URINALYSIS DIPSTICK
Bilirubin, UA: NEGATIVE
Blood, UA: NEGATIVE
Glucose, UA: NEGATIVE
Ketones, UA: NEGATIVE
Leukocytes, UA: NEGATIVE
Nitrite, UA: NEGATIVE
Protein, UA: NEGATIVE
Spec Grav, UA: 1.025 (ref 1.010–1.025)
Urobilinogen, UA: 0.2 E.U./dL
pH, UA: 5.5 (ref 5.0–8.0)

## 2021-12-18 NOTE — Progress Notes (Signed)
Robin [name] is a 56 y.o. female here for a follow up of a pre-existing problem. ? ?History of Present Illness:  ? ?Chief Complaint  ?Patient presents with  ? Dysuria  ?  Pt c/o burning with urination and low back pain for awhile. Also having vaginal pain. Denies vaginal discharge, no fever or chills.  ? ? ?HPI ? ?Dysuria ?Patient complain of dysuria that has been onset for the past few weeks. States she has had some vaginal discharge as brown color. She does not think this feels like a yeast infection. She is having some back and vaginal pain as well.  Denies fever or chills. No reported vaginal bleeding. Denies any uses of new soaps or detergents.   ? ?She states that she is sexually active. She was found to have trich in her urine on 07/11/21 ED visit. She was treated with flagyl. She states that her partner was never treated and she has had sexual intercourse with him since that time. Denies pain with sex. ?  ? ?Past Medical History:  ?Diagnosis Date  ? A-fib ([place])   ? Abdominal aortic aneurysm (AAA) ([place])   ? Allergy   ? Anemia   ? Arthritis   ? Atrial fibrillation ([place])   ? CHF (congestive heart failure) ([place])   ? a. EF at 30-35% by echo in 08/2018 b. EF at 35% by repeat echo in 05/2019 c. EF improved to 65-70% in 2021 b. EF at 40-45% in 08/2021.  ? Chronic abdominal pain   ? Chronic atrial fibrillation (H[facility])   ? Cocaine abuse ([place])   ? COPD (chronic obstructive pulmonary disease) ([place])   ? Diabetes mellitus without complication ([place])   ? Essential hypertension, benign   ? Expressive aphasia   ? Expressive aphasia   ? post CVA  ? Fatty liver   ? GERD (gastroesophageal reflux disease)   ? Gout 2016  ? Normal coronary arteries   ? 3/10 - following abnormal Myoview  ? Ovarian cyst   ? Stroke [facility]) 12/26/2019  ? Right sided weakness, and expressive aphasia  ? Stroke [facility])   ? Thoracic ascending aortic aneurysm ([place])   ? 4 cm 10/31/19 CTA  ? Type 2 diabetes mellitus ([place])   ? type II  ? ?  ?Social History   ? ?Tobacco Use  ? Smoking status: Former  ?  Years: 29.00  ?  Types: Cigarettes  ?  Quit date: 12/2019  ?  Years since quitting: 2.0  ? Smokeless tobacco: Never  ?Vaping Use  ? Vaping Use: Never used  ?Substance Use Topics  ? Alcohol use: Not Currently  ?  Comment: occ  ? Drug use: Not Currently  ? ? ?Past Surgical History:  ?Procedure Laterality Date  ? ABDOMINAL HYSTERECTOMY  09/10/2011  ? Procedure: HYSTERECTOMY ABDOMINAL;  Surgeon: Jonnie Kind, MD;  Location: [facility];  Service: Gynecology;  Laterality: N/A;  Abdominal hysterectomy  ? CESAREAN SECTION  [id number], and 1994  ? CHOLECYSTECTOMY  1995  ? IR 3D INDEPENDENT WKST  03/16/2020  ? IR ANGIO INTRA EXTRACRAN SEL INTERNAL CAROTID BILAT MOD SED  03/16/2020  ? IR ANGIO VERTEBRAL SEL SUBCLAVIAN INNOMINATE UNI L MOD SED  03/16/2020  ? IR ANGIO VERTEBRAL SEL VERTEBRAL UNI R MOD SED  03/16/2020  ? IR CT HEAD LTD  12/27/2019  ? IR PERCUTANEOUS ART THROMBECTOMY/INFUSION INTRACRANIAL INC DIAG ANGIO  12/27/2019  ? IR US GUIDE VASC ACCESS RIGHT  12/27/2019  ? IR US GUIDE  VASC ACCESS RIGHT  03/16/2020  ? RADIOLOGY WITH ANESTHESIA N/A 12/27/2019  ? Procedure: IR WITH ANESTHESIA;  Surgeon: Luanne Bras, MD;  Location: [facility];  Service: Radiology;  Laterality: N/A;  ? SCAR REVISION  09/10/2011  ? Procedure: SCAR REVISION;  Surgeon: Jonnie Kind, MD;  Location: [facility];  Service: Gynecology;  Laterality: N/A;  Wide Excision of old Cicatrix  ? TUBAL LIGATION  1994  ? ? ?Family History  ?Problem Relation Age of Onset  ? Cirrhosis Mother   ? Early death Mother   ? Alcohol abuse Mother   ? Diabetes type II Father   ? Alcohol abuse Father   ? Diabetes type II Sister   ? Early death Brother   ? Alcohol abuse Son   ? Anesthesia problems Neg Hx   ? Hypotension Neg Hx   ? Malignant hyperthermia Neg Hx   ? Pseudochol deficiency Neg Hx   ? Colon cancer Neg Hx   ? Colon polyps Neg Hx   ? Esophageal cancer Neg Hx   ? Rectal cancer Neg Hx   ? Stomach cancer Neg Hx   ? ? ?Allergies  ?Allergen  Reactions  ? Bee Venom Shortness Of Breath and Swelling  ?  Bodily Swelling  ? Losartan Other (See Comments)  ?  Nosebleeds per patient report.   ? Naproxen Other (See Comments)  ?  Acid reflux  ? Penicillins Nausea Only  ?  Has patient had a PCN reaction causing immediate rash, facial/tongue/throat swelling, SOB or lightheadedness with hypotension: no ?Has patient had a PCN reaction causing severe rash involving mucus membranes or skin necrosis: no ?Has patient had a PCN reaction that required hospitalization no ?Has patient had a PCN reaction occurring within the last 10 years: no ?If all of the above answers are "NO", then may proceed with Cephalosporin   ? Lisinopril   ?  Sinus congestion  ? ? ?Current Medications:  ? ?Current Outpatient Medications:  ?  Accu-Chek Softclix Lancets lancets, Use as instructed, Disp: 100 each, Rfl: 12 ?  aspirin EC 81 MG tablet, Take 1 tablet (81 mg total) by mouth daily. Swallow whole., Disp: 90 tablet, Rfl: 3 ?  BD PEN NEEDLE NANO U/F 32G X 4 MM MISC, Inject 1 Device into the skin in the morning, at noon, in the evening, and at bedtime., Disp: 400 each, Rfl: 3 ?  Blood Glucose Monitoring Suppl (ACCU-CHEK GUIDE ME) w/Device KIT, 1 Device by Does not apply route 3 (three) times daily., Disp: 1 kit, Rfl: 0 ?  carvedilol (COREG) 25 MG tablet, Take 2 tablets (50 mg total) by mouth 2 (two) times daily with a meal., Disp: 120 tablet, Rfl: 2 ?  Dulaglutide (TRULICITY) 1.5 IR/6.7EL SOPN, Inject 1.5 mg into the skin once a week., Disp: 6 mL, Rfl: 3 ?  ELIQUIS 5 MG TABS tablet, Take 1 tablet by mouth twice daily (Patient taking differently: Take 5 mg by mouth 2 (two) times daily.), Disp: 180 tablet, Rfl: 1 ?  furosemide (LASIX) 20 MG tablet, Take 20 mg daily. May take additional 20 mg if weight gain is over 2 lbs overnight, or 5 lbs in 1 week, Disp: 35 tablet, Rfl: 3 ?  gabapentin (NEURONTIN) 100 MG capsule, TAKE 2 CAPSULES BY MOUTH THREE TIMES DAILY (Patient taking differently: Take 200 mg  by mouth 3 (three) times daily.), Disp: 270 capsule, Rfl: 0 ?  glucose blood (ACCU-CHEK GUIDE) test strip, 1 each by Other route 3 (three) times  daily. Use as instructed, Disp: 300 each, Rfl: 3 ?  hydrALAZINE (APRESOLINE) 25 MG tablet, TAKE 1 TABLET BY MOUTH THREE TIMES DAILY (Patient taking differently: Take 25 mg by mouth 3 (three) times daily.), Disp: 270 tablet, Rfl: 1 ?  insulin aspart (NOVOLOG FLEXPEN) 100 UNIT/ML FlexPen, Inject 12 Units into the skin 3 (three) times daily with meals., Disp: 30 mL, Rfl: 3 ?  insulin glargine (LANTUS SOLOSTAR) 100 UNIT/ML Solostar Pen, Inject 40 Units into the skin daily., Disp: 45 mL, Rfl: 3 ?  isosorbide mononitrate (IMDUR) 30 MG 24 hr tablet, Take 2 tablets by mouth once daily (Patient taking differently: Take 60 mg by mouth daily.), Disp: 180 tablet, Rfl: 1 ?  metoCLOPramide (REGLAN) 10 MG tablet, Take 1 tablet (10 mg total) by mouth every 6 (six) hours as needed for nausea., Disp: 15 tablet, Rfl: 0 ?  nitroGLYCERIN (NITROSTAT) 0.4 MG SL tablet, Place 1 tablet (0.4 mg total) under the tongue every 5 (five) minutes as needed for chest pain., Disp: 30 tablet, Rfl: 12 ?  pantoprazole (PROTONIX) 40 MG tablet, Take 1 tablet (40 mg total) by mouth daily., Disp: 90 tablet, Rfl: 1  ? ?Review of Systems:  ? ?ROS ?Negative unless otherwise specified per HPI.  ? ?Vitals:  ? ?Vitals:  ? 12/18/21 1418  ?BP: 126/70  ?Pulse: 81  ?Temp: 98.1 ?F (36.7 ?C)  ?TempSrc: Temporal  ?SpO2: 96%  ?Weight: 236 lb 4 oz (107.2 kg)  ?Height: $RemoveB'5\' 5"'hEGwerBx$  (1.651 m)  ?   ?Body mass index is 39.31 kg/m?. ? ?Physical Exam:  ? ?Physical Exam ?Vitals and nursing note reviewed. Exam conducted with a chaperone present.  ?Constitutional:   ?   General: She is not in acute distress. ?   Appearance: She is well-developed. She is not ill-appearing or toxic-appearing.  ?Cardiovascular:  ?   Rate and Rhythm: Normal rate. Rhythm irregular.  ?   Pulses: Normal pulses.  ?   Heart sounds: Normal heart sounds, S1 normal and S2  normal.  ?Pulmonary:  ?   Effort: Pulmonary effort is normal.  ?   Breath sounds: Normal breath sounds.  ?Abdominal:  ?   General: Abdomen is flat. Bowel sounds are normal.  ?   Palpations: Abdomen is soft.

## 2021-12-18 NOTE — Patient Instructions (Signed)
It was great to see you! ? ?I will be in touch with all of your results. ? ?Take care, ? ?Inda Coke PA-C  ?

## 2021-12-19 LAB — URINE CULTURE
MICRO NUMBER:: 13402518
SPECIMEN QUALITY:: ADEQUATE

## 2021-12-19 LAB — RPR: RPR Ser Ql: NONREACTIVE

## 2021-12-20 LAB — CERVICOVAGINAL ANCILLARY ONLY
Bacterial Vaginitis (gardnerella): NEGATIVE
Candida Glabrata: NEGATIVE
Candida Vaginitis: NEGATIVE
Chlamydia: NEGATIVE
Comment: NEGATIVE
Comment: NEGATIVE
Comment: NEGATIVE
Comment: NEGATIVE
Comment: NEGATIVE
Comment: NORMAL
Neisseria Gonorrhea: NEGATIVE
Trichomonas: NEGATIVE

## 2021-12-21 ENCOUNTER — Encounter: Payer: Self-pay | Admitting: Physician Assistant

## 2021-12-21 ENCOUNTER — Encounter: Payer: Self-pay | Admitting: Student

## 2021-12-21 ENCOUNTER — Ambulatory Visit (INDEPENDENT_AMBULATORY_CARE_PROVIDER_SITE_OTHER): Payer: Medicaid Other | Admitting: Student

## 2021-12-21 VITALS — BP 108/70 | HR 94 | Ht 67.0 in | Wt 236.0 lb

## 2021-12-21 DIAGNOSIS — I4821 Permanent atrial fibrillation: Secondary | ICD-10-CM | POA: Diagnosis not present

## 2021-12-21 DIAGNOSIS — N1832 Chronic kidney disease, stage 3b: Secondary | ICD-10-CM

## 2021-12-21 DIAGNOSIS — I5020 Unspecified systolic (congestive) heart failure: Secondary | ICD-10-CM | POA: Diagnosis not present

## 2021-12-21 DIAGNOSIS — Z79899 Other long term (current) drug therapy: Secondary | ICD-10-CM | POA: Diagnosis not present

## 2021-12-21 DIAGNOSIS — Z8673 Personal history of transient ischemic attack (TIA), and cerebral infarction without residual deficits: Secondary | ICD-10-CM | POA: Diagnosis not present

## 2021-12-21 NOTE — Progress Notes (Unsigned)
Cardiology Office Note    Date:  12/22/2021   ID:  Robin [name], DOB 1966-07-09, MRN [id number]  PCP:  Inda Coke, [place]  Cardiologist: Carlyle Dolly, MD    Chief Complaint  Patient presents with   Hospitalization Follow-up    History of Present Illness:    Robin [name] is a 56 y.o. female with past medical history of HFimpEF (EF at 42 to 35% by echo in 08/2018 with similar results by echo in 05/2019 and in the setting of substance abuse and atrial fibrillation, EF improved to 65-70% by echo in 04/2020, at 50-55% by echo in 07/2020, at 40-45% in 08/2021), persistent atrial fibrillation (diagnosed in 04/2019 and rate-control pursued given substance use), HTN, IDDM, COPD, thoracic aortic aneurysm, normal coronaries by catheterization in 2010, prior CVA and history of substance abuse who presents to the office today for hospital follow-up.   She was last examined by the Cardiology service during admission in 11/2021 for which she had initially presented for evaluation of a headache and right-sided facial numbness.  CT Head showed no acute intracranial abnormalities and cardiology was consulted due to a CHF exacerbation his BNP was elevated to 1106 on admission. She was also found to be in atrial fibrillation with RVR. A repeat echocardiogram was obtained and showed that her EF was reduced to 25 to 30% and it was felt that this was tachycardia induced given her arrhythmia. She responded well to IV Lasix during admission and Coreg was further titrated to 50 mg twice daily for rate control with Hydralazine being reduced to allow for titration of this. She was discharged on Lasix 20 mg daily and weight at the time of discharge was 229 lbs. Creatinine did peak at 1.96 during admission and was at 1.82 upon hospital discharge.   In talking with the patient and her sister today, they report her weight has been stable on her home scales (increased to 236 lbs on the office scales today but this  is similar to her weight at home following hospital discharge). Breathing has been stable with no recent orthopnea, PND or pitting edema. No reported chest pain or palpitations. She does have caregivers present throughout much of the day and eats at home. They try to limit her sodium intake. She does report intermittent bilateral leg pain which is worse with bending her legs and improves with Tylenol. She has not missed any doses of anticoagulation.   Past Medical History:  Diagnosis Date   A-fib [facility])    Abdominal aortic aneurysm (AAA) ([place])    Allergy    Anemia    Arthritis    Atrial fibrillation (HCC)    CHF (congestive heart failure) ([place])    a. EF at 30-35% by echo in 08/2018 b. EF at 35% by repeat echo in 05/2019 c. EF improved to 65-70% in 2021 b. EF at 40-45% in 08/2021.   Chronic abdominal pain    Chronic atrial fibrillation (HCC)    Cocaine abuse (HCC)    COPD (chronic obstructive pulmonary disease) (HCC)    Diabetes mellitus without complication ([place])    Essential hypertension, benign    Expressive aphasia    Expressive aphasia    post CVA   Fatty liver    GERD (gastroesophageal reflux disease)    Gout 2016   Normal coronary arteries    3/10 - following abnormal Myoview   Ovarian cyst    Stroke ([place]) 12/26/2019   Right sided weakness, and expressive aphasia  Stroke [facility])    Thoracic ascending aortic aneurysm ([place])    4 cm 10/31/19 CTA   Type 2 diabetes mellitus ([place])    type II    Past Surgical History:  Procedure Laterality Date   ABDOMINAL HYSTERECTOMY  09/10/2011   Procedure: HYSTERECTOMY ABDOMINAL;  Surgeon: Jonnie Kind, MD;  Location: [facility];  Service: Gynecology;  Laterality: N/A;  Abdominal hysterectomy   CESAREAN SECTION  [phone], and [place]  03/16/2020   IR ANGIO INTRA EXTRACRAN SEL INTERNAL CAROTID BILAT MOD SED  03/16/2020   IR ANGIO VERTEBRAL SEL SUBCLAVIAN INNOMINATE UNI L MOD SED  03/16/2020   IR  ANGIO VERTEBRAL SEL VERTEBRAL UNI R MOD SED  03/16/2020   IR CT HEAD LTD  12/27/2019   IR PERCUTANEOUS ART THROMBECTOMY/INFUSION INTRACRANIAL INC DIAG ANGIO  12/27/2019   IR US GUIDE VASC ACCESS RIGHT  12/27/2019   IR US GUIDE VASC ACCESS RIGHT  03/16/2020   RADIOLOGY WITH ANESTHESIA N/A 12/27/2019   Procedure: IR WITH ANESTHESIA;  Surgeon: Luanne Bras, MD;  Location: [place];  Service: Radiology;  Laterality: N/A;   SCAR REVISION  09/10/2011   Procedure: SCAR REVISION;  Surgeon: Jonnie Kind, MD;  Location: [facility];  Service: Gynecology;  Laterality: N/A;  Wide Excision of old Cicatrix   TUBAL LIGATION  1994    Current Medications: Outpatient Medications Prior to Visit  Medication Sig Dispense Refill   Accu-Chek Softclix Lancets lancets Use as instructed 100 each 12   aspirin EC 81 MG tablet Take 1 tablet (81 mg total) by mouth daily. Swallow whole. 90 tablet 3   BD PEN NEEDLE NANO U/F 32G X 4 MM MISC Inject 1 Device into the skin in the morning, at noon, in the evening, and at bedtime. 400 each 3   Blood Glucose Monitoring Suppl (ACCU-CHEK GUIDE ME) w/Device KIT 1 Device by Does not apply route 3 (three) times daily. 1 kit 0   carvedilol (COREG) 25 MG tablet Take 2 tablets (50 mg total) by mouth 2 (two) times daily with a meal. 120 tablet 2   Dulaglutide (TRULICITY) 1.5 ZW/2.5EN SOPN Inject 1.5 mg into the skin once a week. 6 mL 3   ELIQUIS 5 MG TABS tablet Take 1 tablet by mouth twice daily (Patient taking differently: Take 5 mg by mouth 2 (two) times daily.) 180 tablet 1   furosemide (LASIX) 20 MG tablet Take 20 mg daily. May take additional 20 mg if weight gain is over 2 lbs overnight, or 5 lbs in 1 week 35 tablet 3   gabapentin (NEURONTIN) 100 MG capsule TAKE 2 CAPSULES BY MOUTH THREE TIMES DAILY (Patient taking differently: Take 200 mg by mouth 3 (three) times daily.) 270 capsule 0   glucose blood (ACCU-CHEK GUIDE) test strip 1 each by Other route 3 (three) times daily. Use as  instructed 300 each 3   hydrALAZINE (APRESOLINE) 25 MG tablet TAKE 1 TABLET BY MOUTH THREE TIMES DAILY (Patient taking differently: Take 25 mg by mouth 3 (three) times daily.) 270 tablet 1   insulin aspart (NOVOLOG FLEXPEN) 100 UNIT/ML FlexPen Inject 12 Units into the skin 3 (three) times daily with meals. 30 mL 3   insulin glargine (LANTUS SOLOSTAR) 100 UNIT/ML Solostar Pen Inject 40 Units into the skin daily. 45 mL 3   isosorbide mononitrate (IMDUR) 30 MG 24 hr tablet Take 2 tablets by mouth once daily (Patient taking differently: Take 60  mg by mouth daily.) 180 tablet 1   metoCLOPramide (REGLAN) 10 MG tablet Take 1 tablet (10 mg total) by mouth every 6 (six) hours as needed for nausea. 15 tablet 0   nitroGLYCERIN (NITROSTAT) 0.4 MG SL tablet Place 1 tablet (0.4 mg total) under the tongue every 5 (five) minutes as needed for chest pain. 30 tablet 12   pantoprazole (PROTONIX) 40 MG tablet Take 1 tablet (40 mg total) by mouth daily. 90 tablet 1   No facility-administered medications prior to visit.     Allergies:   Bee venom, Losartan, Naproxen, Penicillins, and Lisinopril   Social History   Socioeconomic History   Marital status: Single    Spouse name: Not on file   Number of children: Not on file   Years of education: Not on file   Highest education level: Not on file  Occupational History   Not on file  Tobacco Use   Smoking status: Former    Years: 29.00    Types: Cigarettes    Quit date: 12/2019    Years since quitting: 2.0   Smokeless tobacco: Never  Vaping Use   Vaping Use: Never used  Substance and Sexual Activity   Alcohol use: Not Currently    Comment: occ   Drug use: Not Currently   Sexual activity: Not Currently    Birth control/protection: Surgical  Other Topics Concern   Not on file  Social History Narrative   ** Merged History Encounter **       Part-time at [employer], lives in [place], [place] 3 children  Married   Social Determinants of Adult nurse Strain: Not on file  Food Insecurity: Not on file  Transportation Needs: Not on file  Physical Activity: Not on file  Stress: Not on file  Social Connections: Not on file     Family History:  The patient's family history includes Alcohol abuse in her father, mother, and son; Cirrhosis in her mother; Diabetes type II in her father and sister; Early death in her brother and mother.   Review of Systems:    Please see the history of present illness.     All other systems reviewed and are otherwise negative except as noted above.   Physical Exam:    VS:  BP 108/70   Pulse 94   Ht _0  (1.702 m)   Wt 236 lb (107 kg)   LMP 08/21/2011   SpO2 97%   BMI 36.96 kg/m    General: Well developed, well nourished,female appearing in no acute distress. Head: Normocephalic, atraumatic. Neck: No carotid bruits. JVD not elevated.  Lungs: Respirations regular and unlabored, without wheezes or rales.  Heart: Irregularly irregular. No S3 or S4.  No murmur, no rubs, or gallops appreciated. Abdomen: Appears non-distended. No obvious abdominal masses. Msk:  Strength and tone appear normal for age. No obvious joint deformities or effusions. Extremities: No clubbing or cyanosis. No pitting edema.  Distal pedal pulses are 2+ bilaterally. Neuro: Alert and oriented X 3. Moves all extremities spontaneously. Expressive aphasia.  Psych:  Responds to questions appropriately with a normal affect. Skin: No rashes or lesions noted  Wt Readings from Last 3 Encounters:  12/21/21 236 lb (107 kg)  12/18/21 236 lb 4 oz (107.2 kg)  12/01/21 229 lb 8 oz (104.1 kg)      Studies/Labs Reviewed:   EKG:  EKG is not ordered today.    Recent Labs: 06/19/2021: TSH 1.82 11/26/2021: B Natriuretic  Peptide 1,106.0 11/27/2021: ALT 31; Magnesium 2.0 11/29/2021: Hemoglobin 9.3; Platelets 307 11/30/2021: BUN 35; Creatinine, Ser 1.82; Potassium 3.5; Sodium 137   Lipid Panel    Component Value  Date/Time   CHOL 58 07/05/2020 1359   TRIG 51 07/05/2020 1359   HDL 28 (L) 07/05/2020 1359   CHOLHDL 2.1 07/05/2020 1359   VLDL 26 12/27/2019 0305   LDLCALC 17 07/05/2020 1359    Additional studies/ records that were reviewed today include:   Echo: 08/2021 IMPRESSIONS     1. Poor acoustic windows. Difficult to see endocardium Overall LVEF  appearsdepressed. Different from echo in Dec 2021 (images better in this  exam). Consider limited echo with Definity to confirm. . Left ventricular  ejection fraction, by estimation, is  40 to 45%. The left ventricle has mildly decreased function. The left  ventricle demonstrates global hypokinesis. There is mild left ventricular  hypertrophy. Left ventricular diastolic parameters are indeterminate.   2. Right ventricular systolic function is mildly reduced. The right  ventricular size is normal.   3. Left atrial size was severely dilated.   4. Right atrial size was severely dilated.   5. A small pericardial effusion is present.   6. Mild mitral valve regurgitation.   7. AV is thickneed, calcified. Appears functionally bicuspid Peak and  mean gradients through the valve are 18 and 11 mm Hg respectively  consistent with very mild AS. Marland Kitchen Aortic valve regurgitation is mild. Aortic  valve sclerosis/calcification is present,  without any evidence of aortic stenosis.   8. The inferior vena cava is dilated in size with <50% respiratory  variability, suggesting right atrial pressure of 15 mmHg.   FINDINGS   Limited Echo: 11/2021 IMPRESSIONS     1. Left ventricular ejection fraction, by estimation, is 25 to 30%. The  left ventricle has severely decreased function. The left ventricle  demonstrates global hypokinesis. There is mild concentric left ventricular  hypertrophy.   2. Right ventricular systolic function is normal. The right ventricular  size is normal.   3. Left atrial size was severely dilated.   4. The mitral valve is normal in  structure. No evidence of mitral valve  regurgitation. No evidence of mitral stenosis.   5. The aortic valve was not well visualized. Aortic valve regurgitation  is not visualized.   Assessment:    1. HFrEF (heart failure with reduced ejection fraction) ([place])   2. Medication management   3. Permanent atrial fibrillation ([place])   4. History of CVA (cerebrovascular accident)   5. Stage 3b chronic kidney disease (CKD) ([place])      Plan:   In order of problems listed above:  1. HFrEF - Her EF has been variable throughout the years but had recently declined to 25-30% but felt to be tachycardia-mediated in the setting of atrial fibrillation with RVR.  - Her weight has been stable on her home scales and breathing has been at baseline.  - Continue current medical therapy for now with Lasix 67m daily, Coreg 558mBID, Hydralazine 2554mID and Imdur 36m77mily. Will recheck a CBC and BMET next week. Pending reassessment of her renal function, would consider adding FarxIrane has not been on an ACE-I/ARB/ARNI/Spiro given her variable renal function. Would plan for a repeat limited echo once medical therapy has been optimized.   2. Persistent Atrial Fibrillation - Diagnosed in 04/2019 and rate-control pursued given substance use. Repeat echos have shown severe LA dilatation, therefore rate-control has been pursued. HR has been  in the 80's to 90's. Continue Coreg 33m BID for rate-control. - No reports of active bleeding. Remains on Eliquis for anticoagulation.   3. HTN - BP is well-controlled at 108/70 during today's visit. Continue current medical therapy.   4. History of CVA - She does have residual expressive aphasia but is able to answer questions. She has caregivers present a majority of the day and family members assist with medication management.   5. Stage 3 CKD - Variable creatinine from 1.7 - 2.2 this year. Peaked at 1.96 during her recent admission. Will recheck a BMET next week.     Medication Adjustments/Labs and Tests Ordered: Current medicines are reviewed at length with the patient today.  Concerns regarding medicines are outlined above.  Medication changes, Labs and Tests ordered today are listed in the Patient Instructions below. Patient Instructions  Medication Instructions:  Your physician recommends that you continue on your current medications as directed. Please refer to the Current Medication list given to you today.  *If you need a refill on your cardiac medications before your next appointment, please call your pharmacy*   Lab Work: Your physician recommends that you return for lab work in: Next Week   If you have labs (blood work) drawn today and your tests are completely normal, you will receive your results only by: MyChart Message (if you have MyChart) OR A paper copy in the mail If you have any lab test that is abnormal or we need to change your treatment, we will call you to review the results.   Testing/Procedures: NONE    Follow-Up: [facility] and your [facility] needs are our [facility].  As part of our continuing mission to provide you with exceptional heart care, we have created designated Provider Care Teams.  These Care Teams include your primary Cardiologist (physician) and Advanced Practice Providers (APPs -  Physician Assistants and Nurse Practitioners) who all work together to provide you with the care you need, when you need it.  We recommend signing up for the patient portal called "MyChart".  Sign up information is provided on this After Visit Summary.  MyChart is used to connect with patients for Virtual Visits (Telemedicine).  Patients are able to view lab/test results, encounter notes, upcoming appointments, etc.  Non-urgent messages can be sent to your provider as well.   To learn more about what you can do with MyChart, go to [email or web address]    Your next appointment:   3 month(s)  The format for your next  appointment:   In [place]  Provider:   BBernerd Pho PA-C    Other Instructions Thank you for choosing [facility]    Important Information About Sugar         Signed, Robin Heritage PA-C  12/22/2021 9:33 AM    [facility] [address] StreetRH[address]en Atlantic [id number]: [phone]: [phone]

## 2021-12-21 NOTE — Patient Instructions (Signed)
Medication Instructions:  Your physician recommends that you continue on your current medications as directed. Please refer to the Current Medication list given to you today.  *If you need a refill on your cardiac medications before your next appointment, please call your pharmacy*   Lab Work: Your physician recommends that you return for lab work in: Next Week   If you have labs (blood work) drawn today and your tests are completely normal, you will receive your results only by: MyChart Message (if you have MyChart) OR A paper copy in the mail If you have any lab test that is abnormal or we need to change your treatment, we will call you to review the results.   Testing/Procedures: NONE    Follow-Up: At [facility], you and your health needs are our priority.  As part of our continuing mission to provide you with exceptional heart care, we have created designated Provider Care Teams.  These Care Teams include your primary Cardiologist (physician) and Advanced Practice Providers (APPs -  Physician Assistants and Nurse Practitioners) who all work together to provide you with the care you need, when you need it.  We recommend signing up for the patient portal called "MyChart".  Sign up information is provided on this After Visit Summary.  MyChart is used to connect with patients for Virtual Visits (Telemedicine).  Patients are able to view lab/test results, encounter notes, upcoming appointments, etc.  Non-urgent messages can be sent to your provider as well.   To learn more about what you can do with MyChart, go to [email or web address].    Your next appointment:   3 month(s)  The format for your next appointment:   In [place]  Provider:   Bernerd Pho, PA-C    Other Instructions Thank you for choosing [facility]!    Important Information About Sugar

## 2021-12-22 ENCOUNTER — Encounter: Payer: Self-pay | Admitting: Student

## 2021-12-28 ENCOUNTER — Other Ambulatory Visit: Payer: Self-pay | Admitting: Physician Assistant

## 2021-12-28 DIAGNOSIS — I10 Essential (primary) hypertension: Secondary | ICD-10-CM

## 2022-01-21 ENCOUNTER — Ambulatory Visit: Payer: Medicaid Other | Admitting: Cardiology

## 2022-02-21 ENCOUNTER — Other Ambulatory Visit: Payer: Self-pay | Admitting: Student

## 2022-02-22 ENCOUNTER — Other Ambulatory Visit: Payer: Self-pay | Admitting: Physician Assistant

## 2022-03-04 ENCOUNTER — Other Ambulatory Visit: Payer: Self-pay | Admitting: Physician Assistant

## 2022-03-05 NOTE — Telephone Encounter (Signed)
Pt requesting refill for Gabapentin. Last OV 12/18/2021.

## 2022-03-12 ENCOUNTER — Other Ambulatory Visit: Payer: Self-pay | Admitting: Physician Assistant

## 2022-03-12 DIAGNOSIS — I10 Essential (primary) hypertension: Secondary | ICD-10-CM

## 2022-03-22 ENCOUNTER — Telehealth: Payer: Self-pay | Admitting: Cardiology

## 2022-03-22 MED ORDER — CARVEDILOL 25 MG PO TABS: 50.0000 mg | ORAL_TABLET | Freq: Two times a day (BID) | ORAL | 3 refills | Status: DC

## 2022-03-22 NOTE — Telephone Encounter (Signed)
Refilled as requested, 90 day supply given

## 2022-03-22 NOTE — Telephone Encounter (Signed)
*  STAT* If patient is at the pharmacy, call can be transferred to refill team.   1. Which medications need to be refilled? (please list name of each medication and dose if known)   carvedilol (COREG) 25 MG tablet  2. Which pharmacy/location (including street and city if local pharmacy) is medication to be sent to?  [place], [place] [address]  3. Do they need a 30 day or 90 day supply?   90 day  Sister called stating patient is completely out of this medication.

## 2022-03-26 ENCOUNTER — Other Ambulatory Visit: Payer: Self-pay | Admitting: Physician Assistant

## 2022-03-27 ENCOUNTER — Emergency Department (HOSPITAL_COMMUNITY)
Admission: EM | Admit: 2022-03-27 | Discharge: 2022-03-27 | Disposition: A | Discharge status: Home / Self Care | Payer: Medicaid Other | Attending: Emergency Medicine | Admitting: Emergency Medicine

## 2022-03-27 DIAGNOSIS — Z7982 Long term (current) use of aspirin: Secondary | ICD-10-CM | POA: Diagnosis not present

## 2022-03-27 DIAGNOSIS — R4781 Slurred speech: Secondary | ICD-10-CM | POA: Diagnosis not present

## 2022-03-27 DIAGNOSIS — M792 Neuralgia and neuritis, unspecified: Secondary | ICD-10-CM | POA: Diagnosis not present

## 2022-03-27 DIAGNOSIS — E1165 Type 2 diabetes mellitus with hyperglycemia: Secondary | ICD-10-CM | POA: Insufficient documentation

## 2022-03-27 DIAGNOSIS — Z7901 Long term (current) use of anticoagulants: Secondary | ICD-10-CM | POA: Diagnosis not present

## 2022-03-27 DIAGNOSIS — R531 Weakness: Secondary | ICD-10-CM | POA: Diagnosis not present

## 2022-03-27 DIAGNOSIS — Z794 Long term (current) use of insulin: Secondary | ICD-10-CM | POA: Insufficient documentation

## 2022-03-27 DIAGNOSIS — R4701 Aphasia: Secondary | ICD-10-CM | POA: Insufficient documentation

## 2022-03-27 DIAGNOSIS — R4182 Altered mental status, unspecified: Secondary | ICD-10-CM | POA: Diagnosis not present

## 2022-03-27 DIAGNOSIS — M79662 Pain in left lower leg: Secondary | ICD-10-CM | POA: Diagnosis present

## 2022-03-27 LAB — CBC WITH DIFFERENTIAL/PLATELET
Abs Immature Granulocytes: 0.03 10*3/uL (ref 0.00–0.07)
Basophils Absolute: 0 10*3/uL (ref 0.0–0.1)
Basophils Relative: 1 %
Eosinophils Absolute: 0.2 10*3/uL (ref 0.0–0.5)
Eosinophils Relative: 2 %
HCT: 28.8 % — ABNORMAL LOW (ref 36.0–46.0)
Hemoglobin: 8.4 g/dL — ABNORMAL LOW (ref 12.0–15.0)
Immature Granulocytes: 0 %
Lymphocytes Relative: 34 %
Lymphs Abs: 2.6 10*3/uL (ref 0.7–4.0)
MCH: 20.9 pg — ABNORMAL LOW (ref 26.0–34.0)
MCHC: 29.2 g/dL — ABNORMAL LOW (ref 30.0–36.0)
MCV: 71.8 fL — ABNORMAL LOW (ref 80.0–100.0)
Monocytes Absolute: 0.6 10*3/uL (ref 0.1–1.0)
Monocytes Relative: 8 %
Neutro Abs: 4.2 10*3/uL (ref 1.7–7.7)
Neutrophils Relative %: 55 %
Platelets: 370 10*3/uL (ref 150–400)
RBC: 4.01 MIL/uL (ref 3.87–5.11)
RDW: 16.3 % — ABNORMAL HIGH (ref 11.5–15.5)
WBC: 7.6 10*3/uL (ref 4.0–10.5)
nRBC: 0 % (ref 0.0–0.2)

## 2022-03-27 LAB — COMPREHENSIVE METABOLIC PANEL
ALT: 10 U/L (ref 0–44)
AST: 14 U/L — ABNORMAL LOW (ref 15–41)
Albumin: 3.6 g/dL (ref 3.5–5.0)
Alkaline Phosphatase: 84 U/L (ref 38–126)
Anion gap: 5 (ref 5–15)
BUN: 22 mg/dL — ABNORMAL HIGH (ref 6–20)
CO2: 24 mmol/L (ref 22–32)
Calcium: 9 mg/dL (ref 8.9–10.3)
Chloride: 104 mmol/L (ref 98–111)
Creatinine, Ser: 1.87 mg/dL — ABNORMAL HIGH (ref 0.44–1.00)
GFR, Estimated: 31 mL/min — ABNORMAL LOW (ref >60)
Glucose, Bld: 410 mg/dL — ABNORMAL HIGH (ref 70–99)
Potassium: 4.5 mmol/L (ref 3.5–5.1)
Sodium: 133 mmol/L — ABNORMAL LOW (ref 135–145)
Total Bilirubin: 0.4 mg/dL (ref 0.3–1.2)
Total Protein: 7.9 g/dL (ref 6.5–8.1)

## 2022-03-27 LAB — CBG MONITORING, ED
Glucose-Capillary: 419 mg/dL — ABNORMAL HIGH (ref 70–99)
Glucose-Capillary: 327 mg/dL — ABNORMAL HIGH (ref 70–99)

## 2022-03-27 MED ORDER — KETOROLAC TROMETHAMINE 30 MG/ML IJ SOLN
30.0000 mg | Freq: Once | INTRAMUSCULAR | Status: AC
  Administered 2022-03-27: 30 mg via INTRAVENOUS
  Filled 2022-03-27: qty 1

## 2022-03-27 MED ORDER — IBUPROFEN 800 MG PO TABS: 800.0000 mg | ORAL_TABLET | Freq: Three times a day (TID) | ORAL | 0 refills | Status: DC | PRN

## 2022-03-27 MED ORDER — METOPROLOL TARTRATE 5 MG/5ML IV SOLN
5.0000 mg | Freq: Once | INTRAVENOUS | Status: AC
  Administered 2022-03-27: 5 mg via INTRAVENOUS
  Filled 2022-03-27: qty 5

## 2022-03-27 MED ORDER — SODIUM CHLORIDE 0.9 % IV BOLUS
1000.0000 mL | Freq: Once | INTRAVENOUS | Status: AC
  Administered 2022-03-27: 1000 mL via INTRAVENOUS

## 2022-03-27 NOTE — ED Notes (Signed)
Limited assessment due to pt unable to answer questions appropriately. EDP to bedside

## 2022-03-27 NOTE — ED Triage Notes (Addendum)
Pt to ED via EMS from home c/o left lower leg pain that started 2 days ago. Pt ambulatory with EMS. Pt hx stroke with left sided deficit, hx expressive aphasia. Pt altered in triage, speech incomprehensible at times, per EMS pt caregiver reports this is pt baseline. Last VS: 126/78, hr 114, 99%RA.

## 2022-03-27 NOTE — Discharge Instructions (Signed)
Follow-up with your family doctor next week for recheck. 

## 2022-03-28 ENCOUNTER — Other Ambulatory Visit: Payer: Self-pay | Admitting: Physician Assistant

## 2022-04-03 NOTE — ED Provider Notes (Signed)
[place] Provider Note   CSN: [id number] Arrival date & time: 03/27/22  1203     History  Chief Complaint  Patient presents with   Leg Pain    Left   Altered Mental Status    Robin [name] is a 56 y.o. female.  Patient with a history of a stroke.  She has expressive aphasia weakness on left leg.  She is complaining of some pain in her left leg  The history is provided by the patient and medical records. No language interpreter was used.  Leg Pain Location:  Leg Pain details:    Quality:  Aching   Radiates to:  L leg   Severity:  Mild   Onset quality:  Gradual   Timing:  Constant   Progression:  Waxing and waning Chronicity:  Recurrent Dislocation: no   Worsened by:  Nothing Ineffective treatments:  None tried Associated symptoms: no back pain and no fever   Altered Mental Status Associated symptoms: no abdominal pain, no fever, no palpitations, no rash, no seizures and no vomiting        Home Medications Prior to Admission medications   Medication Sig Start Date End Date Taking? Authorizing Provider  aspirin EC 81 MG tablet Take 1 tablet (81 mg total) by mouth daily. Swallow whole. 07/19/20  Yes Strader, [place], PA-C  carvedilol (COREG) 25 MG tablet Take 2 tablets (50 mg total) by mouth 2 (two) times daily with a meal. 03/22/22  Yes Branch, Alphonse Guild, MD  Dulaglutide (TRULICITY) 1.5 XT/0.5WP SOPN Inject 1.5 mg into the skin once a week. 10/24/21  Yes Shamleffer, Melanie Crazier, MD  ELIQUIS 5 MG TABS tablet Take 1 tablet by mouth twice daily 02/22/22  Yes Worley, [place], PA  furosemide (LASIX) 20 MG tablet TAKE 1 TABLET BY MOUTH ONCE DAILY MAY  TAKE  ADDITIONAL  20  MG  IF  WEIGHT  GAIN  IS  OVER  2  LBS  OVERNIGHT  OR  5LBS  IN  A  WEEK 02/21/22  Yes Strader, [place] M, PA-C  gabapentin (NEURONTIN) 100 MG capsule TAKE 2 CAPSULES BY MOUTH THREE TIMES DAILY 03/05/22  Yes Morene Rankins, [place], PA  hydrALAZINE (APRESOLINE) 25 MG tablet TAKE 1  TABLET BY MOUTH THREE TIMES DAILY 03/12/22  Yes Inda Coke, PA  ibuprofen (ADVIL) 800 MG tablet Take 1 tablet (800 mg total) by mouth every 8 (eight) hours as needed for moderate pain. 03/27/22  Yes Milton Ferguson, MD  insulin aspart (NOVOLOG FLEXPEN) 100 UNIT/ML FlexPen Inject 12 Units into the skin 3 (three) times daily with meals. 06/13/21  Yes Shamleffer, Melanie Crazier, MD  insulin glargine (LANTUS SOLOSTAR) 100 UNIT/ML Solostar Pen Inject 40 Units into the skin daily. 10/24/21  Yes Shamleffer, Melanie Crazier, MD  isosorbide mononitrate (IMDUR) 30 MG 24 hr tablet Take 2 tablets by mouth once daily 12/28/21  Yes Morene Rankins, [place], PA  metoCLOPramide (REGLAN) 10 MG tablet Take 1 tablet (10 mg total) by mouth every 6 (six) hours as needed for nausea. 07/11/21  Yes Idol, Almyra Free, PA-C  nitroGLYCERIN (NITROSTAT) 0.4 MG SL tablet Place 1 tablet (0.4 mg total) under the tongue every 5 (five) minutes as needed for chest pain. 02/01/20  Yes Angiulli, Lavon Paganini, PA-C  Accu-Chek Softclix Lancets lancets Use as instructed 06/13/21   Shamleffer, Melanie Crazier, MD  BD PEN NEEDLE NANO U/F 32G X 4 MM MISC Inject 1 Device into the skin in the morning, at noon, in the evening,  and at bedtime. 06/13/21   Shamleffer, Konrad Dolores, MD  Blood Glucose Monitoring Suppl (ACCU-CHEK GUIDE ME) w/Device KIT 1 Device by Does not apply route 3 (three) times daily. 03/09/21   Shamleffer, Konrad Dolores, MD  glucose blood (ACCU-CHEK GUIDE) test strip 1 each by Other route 3 (three) times daily. Use as instructed 06/13/21   Shamleffer, Konrad Dolores, MD  pantoprazole (PROTONIX) 40 MG tablet Take 1 tablet by mouth once daily 03/29/22   Jarold Motto, PA      Allergies    Bee venom, Losartan, Naproxen, Penicillins, and Lisinopril    Review of Systems   Review of Systems  Constitutional:  Negative for chills and fever.  HENT:  Negative for ear pain and sore throat.   Eyes:  Negative for pain and visual disturbance.   Respiratory:  Negative for cough and shortness of breath.   Cardiovascular:  Negative for chest pain and palpitations.  Gastrointestinal:  Negative for abdominal pain and vomiting.  Genitourinary:  Negative for dysuria and hematuria.  Musculoskeletal:  Negative for arthralgias and back pain.       Left leg pain  Skin:  Negative for color change and rash.  Neurological:  Negative for seizures and syncope.  All other systems reviewed and are negative.   Physical Exam Updated Vital Signs BP (!) 144/91 (BP Location: Right Arm)   Pulse 95   Temp (!) 97.4 F (36.3 C) (Oral)   Resp 18   LMP 08/21/2011   SpO2 98%  Physical Exam Vitals and nursing note reviewed.  Constitutional:      Appearance: She is well-developed.  HENT:     Head: Normocephalic.     Nose: Nose normal.  Eyes:     General: No scleral icterus.    Conjunctiva/sclera: Conjunctivae normal.  Neck:     Thyroid: No thyromegaly.  Cardiovascular:     Rate and Rhythm: Normal rate and regular rhythm.     Heart sounds: No murmur heard.    No friction rub. No gallop.  Pulmonary:     Breath sounds: No stridor. No wheezing or rales.  Chest:     Chest wall: No tenderness.  Abdominal:     General: There is no distension.     Tenderness: There is no abdominal tenderness. There is no rebound.  Musculoskeletal:     Cervical back: Neck supple.     Comments: Weakness left leg which is her normal  Lymphadenopathy:     Cervical: No cervical adenopathy.  Skin:    Findings: No erythema or rash.  Neurological:     Mental Status: She is alert and oriented to [place], place, and time.     Motor: No abnormal muscle tone.     Coordination: Coordination normal.     Comments: Patient has slurred speech which is her normal  Psychiatric:        Behavior: Behavior normal.     ED Results / Procedures / Treatments   Labs (all labs ordered are listed, but only abnormal results are displayed) Labs Reviewed  CBC WITH  DIFFERENTIAL/PLATELET - Abnormal; Notable for the following components:      Result Value   Hemoglobin 8.4 (*)    HCT 28.8 (*)    MCV 71.8 (*)    MCH 20.9 (*)    MCHC 29.2 (*)    RDW 16.3 (*)    All other components within normal limits  COMPREHENSIVE METABOLIC PANEL - Abnormal; Notable for the following components:  Sodium 133 (*)    Glucose, Bld 410 (*)    BUN 22 (*)    Creatinine, Ser 1.87 (*)    AST 14 (*)    GFR, Estimated 31 (*)    All other components within normal limits  CBG MONITORING, ED - Abnormal; Notable for the following components:   Glucose-Capillary 419 (*)    All other components within normal limits  CBG MONITORING, ED - Abnormal; Notable for the following components:   Glucose-Capillary 327 (*)    All other components within normal limits    EKG EKG Interpretation  Date/Time:  Wednesday March 27 2022 12:33:34 EDT Ventricular Rate:  121 PR Interval:    QRS Duration: 85 QT Interval:  354 QTC Calculation: 503 R Axis:   34 Text Interpretation: Atrial fibrillation Consider anterior infarct Confirmed by Wynona Dove ([id number]) on 03/29/2022 12:24:23 PM  Radiology No results found.  Procedures Procedures    Medications Ordered in ED Medications  sodium chloride 0.9 % bolus 1,000 mL (0 mLs Intravenous Stopped 03/27/22 1537)  ketorolac (TORADOL) 30 MG/ML injection 30 mg (30 mg Intravenous Given 03/27/22 1256)  metoprolol tartrate (LOPRESSOR) injection 5 mg (5 mg Intravenous Given 03/27/22 1303)    ED Course/ Medical Decision Making/ A&P                           Medical Decision Making Amount and/or Complexity of Data Reviewed Labs: ordered. ECG/medicine tests: ordered.  Risk Prescription drug management.  This patient presents to the ED for concern of leg pain, this involves an extensive number of treatment options, and is a complaint that carries with it a high risk of complications and morbidity.  The differential diagnosis includes  neuritis   Co morbidities that complicate the patient evaluation  Stroke   Additional history obtained:  Additional history obtained from patient External records from outside source obtained and reviewed including hospital record   Lab Tests:  I Ordered, and personally interpreted labs.  The pertinent results include: Labs show glucose 400 and hemoglobin 8.4   Imaging Studies ordered:  No imaging  Cardiac Monitoring: / EKG:  The patient was maintained on a cardiac monitor.  I personally viewed and interpreted the cardiac monitored which showed an underlying rhythm of: Normal sinus rhythm   Consultations Obtained:  No consultant  Problem List / ED Course / Critical interventions / Medication management  Leg pain and diabetes I ordered medication including Toradol for pain Reevaluation of the patient after these medicines showed that the patient improved I have reviewed the patients home medicines and have made adjustments as needed   Social Determinants of Health:  History of stroke   Test / Admission - Considered:  No additional test needed  Patient given Toradol for her leg pain.  Suspect neuritis.  She will follow-up with her PCP        Final Clinical Impression(s) / ED Diagnoses Final diagnoses:  Neuritis    Rx / DC Orders ED Discharge Orders          Ordered    ibuprofen (ADVIL) 800 MG tablet  Every 8 hours PRN        03/27/22 1533              Milton Ferguson, MD 04/03/22 1024

## 2022-04-04 ENCOUNTER — Ambulatory Visit: Payer: Medicaid Other | Admitting: Student

## 2022-04-10 ENCOUNTER — Telehealth: Payer: Self-pay | Admitting: Physician Assistant

## 2022-04-10 MED ORDER — GABAPENTIN 100 MG PO CAPS
200.0000 mg | ORAL_CAPSULE | Freq: Three times a day (TID) | ORAL | 1 refills | Status: DC
Start: 2022-04-10 — End: 2022-06-04

## 2022-04-10 NOTE — Telephone Encounter (Signed)
Spoke to pt's sister [name] told her Rx for gabapentin was not denied [place] sent it over to [facility] this morning. [name] verbalized understanding and will contact the pharmacy again.

## 2022-04-10 NOTE — Telephone Encounter (Signed)
Pt requesting refill on Gabapentin. Last OV 12/2021.

## 2022-04-10 NOTE — Telephone Encounter (Signed)
Medication got denied- please follow with patient as per sisters request

## 2022-04-10 NOTE — Telephone Encounter (Signed)
..   Encourage patient to contact the pharmacy for refills or they can request refills through [place]:  Please schedule appointment if longer than 1 year  12/18/21  NEXT APPOINTMENT DATE:  MEDICATION: gabapentin (NEURONTIN) 100 MG capsule  Is the patient out of medication? Yes  PHARMACY:  [place], [place] - [id number] [place] [address] [place] Phone:  [phone]  Fax:  [phone]      Per [name]-Pharmacy told Patient refill was denied by Provider   Let patient know to contact pharmacy at the end of the day to make sure medication is ready.  Please notify patient to allow 48-72 hours to process

## 2022-04-29 ENCOUNTER — Encounter: Payer: Self-pay | Admitting: *Deleted

## 2022-04-30 NOTE — Progress Notes (Unsigned)
Name: Robin [name]  Age/ Sex: 56 y.o., female   MRN/ DOB: [id number], [birth year]     PCP: Inda Coke, PA   Reason for Endocrinology Evaluation: Type 2 Diabetes Mellitus  Initial Endocrine Consultative Visit: 10/01/2018    PATIENT IDENTIFIER: [name] is a 56 y.o. female with a past medical history of T2DM, CHF, COPD , CVA, A.Fib with RVR and GERD. The patient has followed with Endocrinology clinic since 10/01/2018 for consultative assistance with management of her diabetes.  DIABETIC HISTORY:  [name] was diagnosed with DM > 20 yrs ago. Has been on Janumet and Glipizide in the past. Has been on insulin for years. Her hemoglobin A1c has ranged from 7.9% in 2017, peaking at 11.3% in 6314  Started trulicity 97/0263  SUBJECTIVE:   During the last visit (10/25/2020): A1c 12.3 % ,increased MDI regimen and started Trulicity   Today (7/85/8850): [name] is here for a follow up on diabetes management. She checks glucose 3 3x a day. No hypoglycemia   She is accompanied by her sister and caretaker    Since her last visit here she has been multiple times to the ED with headaches and leg pains Has a care taker 8 Am to 1 pm   Pt is somewhat verbal      HOME DIABETES REGIMEN:  Lantus 40 units daily NovoLog 12 units 3 times daily before every meal Trulicity 1.5 Mg weekly     METER DOWNLOAD SUMMARY: Glucose log  153 - 302 mg/dL    DIABETIC COMPLICATIONS: Microvascular complications:  Neuropathy Denies: CKD, retinopathy  Last eye exam: Completed many years ago    Macrovascular complications:  CVA 09/7739 Denies: CAD, PVA    HISTORY:  Past Medical History:  Past Medical History:  Diagnosis Date   A-fib ([place])    Abdominal aortic aneurysm (AAA) ([place])    Allergy    Anemia    Arthritis    Atrial fibrillation ([place])    CHF (congestive heart failure) ([place])    a. EF at 30-35% by echo in 08/2018 b. EF at 35% by repeat echo in 05/2019 c. EF improved to  65-70% in 2021 b. EF at 40-45% in 08/2021.   Chronic abdominal pain    Chronic atrial fibrillation (HCC)    Cocaine abuse (HCC)    COPD (chronic obstructive pulmonary disease) (HCC)    Diabetes mellitus without complication ([place])    Essential hypertension, benign    Expressive aphasia    Expressive aphasia    post CVA   Fatty liver    GERD (gastroesophageal reflux disease)    Gout 2016   Normal coronary arteries    3/10 - following abnormal Myoview   Ovarian cyst    Stroke ([place]) 12/26/2019   Right sided weakness, and expressive aphasia   Stroke [facility])    Thoracic ascending aortic aneurysm (HCC)    4 cm 10/31/19 CTA   Type 2 diabetes mellitus ([place])    type II   Past Surgical History:  Past Surgical History:  Procedure Laterality Date   ABDOMINAL HYSTERECTOMY  09/10/2011   Procedure: HYSTERECTOMY ABDOMINAL;  Surgeon: Jonnie Kind, MD;  Location: [facility];  Service: Gynecology;  Laterality: N/A;  Abdominal hysterectomy   CESAREAN SECTION  [id number], and [place]  03/16/2020   IR ANGIO INTRA EXTRACRAN SEL INTERNAL CAROTID BILAT MOD SED  03/16/2020   IR ANGIO VERTEBRAL SEL SUBCLAVIAN  INNOMINATE UNI L MOD SED  03/16/2020   IR ANGIO VERTEBRAL SEL VERTEBRAL UNI R MOD SED  03/16/2020   IR CT HEAD LTD  12/27/2019   IR PERCUTANEOUS ART THROMBECTOMY/INFUSION INTRACRANIAL INC DIAG ANGIO  12/27/2019   IR US GUIDE VASC ACCESS RIGHT  12/27/2019   IR US GUIDE VASC ACCESS RIGHT  03/16/2020   RADIOLOGY WITH ANESTHESIA N/A 12/27/2019   Procedure: IR WITH ANESTHESIA;  Surgeon: Luanne Bras, MD;  Location: [facility];  Service: Radiology;  Laterality: N/A;   SCAR REVISION  09/10/2011   Procedure: SCAR REVISION;  Surgeon: Jonnie Kind, MD;  Location: [facility];  Service: Gynecology;  Laterality: N/A;  Wide Excision of old [place]   Social History:  reports that she quit smoking about 2 years ago. Her smoking use included cigarettes. She  has never used smokeless tobacco. She reports that she does not currently use alcohol. She reports that she does not currently use drugs. Family History:  Family History  Problem Relation Age of Onset   Cirrhosis Mother    Early death Mother    Alcohol abuse Mother    Diabetes type II Father    Alcohol abuse Father    Diabetes type II Sister    Early death Brother    Alcohol abuse Son    Anesthesia problems Neg Hx    Hypotension Neg Hx    Malignant hyperthermia Neg Hx    Pseudochol deficiency Neg Hx    Colon cancer Neg Hx    Colon polyps Neg Hx    Esophageal cancer Neg Hx    Rectal cancer Neg Hx    Stomach cancer Neg Hx      HOME MEDICATIONS: Allergies as of 05/01/2022       Reactions   Bee Venom Shortness Of Breath, Swelling   Bodily Swelling   Losartan Other (See Comments)   Nosebleeds per patient report.    Naproxen Other (See Comments)   Acid reflux   Penicillins Nausea Only   Has patient had a PCN reaction causing immediate rash, facial/tongue/throat swelling, SOB or lightheadedness with hypotension: no Has patient had a PCN reaction causing severe rash involving mucus membranes or skin necrosis: no Has patient had a PCN reaction that required hospitalization no Has patient had a PCN reaction occurring within the last 10 years: no If all of the above answers are "NO", then may proceed with Cephalosporin    Lisinopril    Sinus congestion        Medication List        Accurate as of April 30, 2022  3:00 PM. If you have any questions, ask your nurse or doctor.          Accu-Chek Guide Me w/Device Kit 1 Device by Does not apply route 3 (three) times daily.   Accu-Chek Guide test strip Generic drug: glucose blood 1 each by Other route 3 (three) times daily. Use as instructed   Accu-Chek Softclix Lancets lancets Use as instructed   aspirin EC 81 MG tablet Take 1 tablet (81 mg total) by mouth daily. Swallow whole.   BD Pen Needle Nano U/F 32G X 4  MM Misc Generic drug: Insulin Pen Needle Inject 1 Device into the skin in the morning, at noon, in the evening, and at bedtime.   carvedilol 25 MG tablet Commonly known as: COREG Take 2 tablets (50 mg total) by mouth 2 (two) times daily with a meal.  Eliquis 5 MG Tabs tablet Generic drug: apixaban Take 1 tablet by mouth twice daily   furosemide 20 MG tablet Commonly known as: LASIX TAKE 1 TABLET BY MOUTH ONCE DAILY MAY  TAKE  ADDITIONAL  20  MG  IF  WEIGHT  GAIN  IS  OVER  2  LBS  OVERNIGHT  OR  5LBS  IN  A  WEEK   gabapentin 100 MG capsule Commonly known as: NEURONTIN Take 2 capsules (200 mg total) by mouth 3 (three) times daily.   hydrALAZINE 25 MG tablet Commonly known as: APRESOLINE TAKE 1 TABLET BY MOUTH THREE TIMES DAILY   ibuprofen 800 MG tablet Commonly known as: ADVIL Take 1 tablet (800 mg total) by mouth every 8 (eight) hours as needed for moderate pain.   isosorbide mononitrate 30 MG 24 hr tablet Commonly known as: IMDUR Take 2 tablets by mouth once daily   Lantus SoloStar 100 UNIT/ML Solostar Pen Generic drug: insulin glargine Inject 40 Units into the skin daily.   metoCLOPramide 10 MG tablet Commonly known as: REGLAN Take 1 tablet (10 mg total) by mouth every 6 (six) hours as needed for nausea.   nitroGLYCERIN 0.4 MG SL tablet Commonly known as: NITROSTAT Place 1 tablet (0.4 mg total) under the tongue every 5 (five) minutes as needed for chest pain.   NovoLOG FlexPen 100 UNIT/ML FlexPen Generic drug: insulin aspart Inject 12 Units into the skin 3 (three) times daily with meals.   pantoprazole 40 MG tablet Commonly known as: PROTONIX Take 1 tablet by mouth once daily   Trulicity 1.5 ZW/2.5EN Sopn Generic drug: Dulaglutide Inject 1.5 mg into the skin once a week.         OBJECTIVE:   Vital Signs: LMP 08/21/2011   Wt Readings from Last 3 Encounters:  12/21/21 236 lb (107 kg)  12/18/21 236 lb 4 oz (107.2 kg)  12/01/21 229 lb 8 oz (104.1 kg)      Exam: General: Pt appears well and is in NAD  Lungs: Clear with good BS bilat with no rales, rhonchi, or wheezes  Heart: RRR   Extremities: No pretibial edema.   Neuro: MS is good with appropriate affect, pt is alert and Ox3    DM foot exam:06/13/2021   The skin of the feet is intact without sores or ulcerations. The pedal pulses are undetectable The sensation is decreased  to a screening 5.07, 10 gram monofilament bilaterally            DATA REVIEWED:  Lab Results  Component Value Date   HGBA1C 12.3 (A) 10/24/2021   HGBA1C 13.4 (A) 06/13/2021   HGBA1C >15.0 03/09/2021     Latest Reference Range & Units 08/28/21 06:23  Sodium 135 - 145 mmol/L 133 (L)  Potassium 3.5 - 5.1 mmol/L 4.1  Chloride 98 - 111 mmol/L 106  CO2 22 - 32 mmol/L 23  Glucose 70 - 99 mg/dL 225 (H)  BUN 6 - 20 mg/dL 25 (H)  Creatinine 0.44 - 1.00 mg/dL 1.72 (H)  Calcium 8.9 - 10.3 mg/dL 8.6 (L)  Anion gap 5 - 15  4 (L)  GFR, Estimated >60 mL/min 35 (L)    ASSESSMENT / PLAN / RECOMMENDATIONS:   1) Type 2 Diabetes Mellitus, Poorly controlled, With Neuropathic and CKD IV and macrovascular complications - Most recent A1c of  9.6  %. Goal A1c < 7.0 %.    - A1c trended down from 12.3 % to 9.6 %  -Metformin has been stopped  due to low GFR -Main barriers to diabetes self care is memory issues and  CVA -She has been  able to demonstrate appropriate insulin pen use in the past but I am concerned of imperfect adherence to medication  -I have attempted to prescribe Dexcom in the past but for some reason she does not have it    MEDICATIONS: Increase Lantus to 40 units daily Continue NovoLog 12 units with each meal Increase trulicity 1.5 mg weekly   EDUCATION / INSTRUCTIONS: BG monitoring instructions: Patient is instructed to check her blood sugars 2 times a day, fasting and supper. Call [facility] if: BG persistently < 70  I reviewed the Rule of 15 for the treatment of  hypoglycemia in detail with the patient. Literature supplied.   2) Diabetic complications:  Eye: Does not have known diabetic retinopathy.   Neuro/ Feet: Does  have known diabetic peripheral neuropathy .  Renal: Patient does  have known baseline CKD III       F/U in 6 months    Signed electronically by: Mack Guise, MD  [facility]  [facility] [address] [place], [place] [address] Phone: [phone] FAX: [phone]   CC: Inda Coke, [address] [place] [address] Phone: [phone]  Fax: [phone]  Return to Endocrinology clinic as below: Future Appointments  Date Time Provider [place]  05/01/2022  9:10 AM Myron Stankovich, Melanie Crazier, MD [facility] None  05/10/2022  3:30 PM Ahmed Prima, Fransisco Hertz, PA-C [facility]

## 2022-05-01 ENCOUNTER — Telehealth: Payer: Self-pay | Admitting: Internal Medicine

## 2022-05-01 ENCOUNTER — Ambulatory Visit (INDEPENDENT_AMBULATORY_CARE_PROVIDER_SITE_OTHER): Payer: Medicaid Other | Admitting: Internal Medicine

## 2022-05-01 ENCOUNTER — Other Ambulatory Visit: Payer: Self-pay | Admitting: Physician Assistant

## 2022-05-01 ENCOUNTER — Encounter: Payer: Self-pay | Admitting: Internal Medicine

## 2022-05-01 ENCOUNTER — Other Ambulatory Visit (HOSPITAL_COMMUNITY): Payer: Self-pay

## 2022-05-01 VITALS — BP 124/70 | HR 75 | Ht 67.0 in | Wt 237.0 lb

## 2022-05-01 DIAGNOSIS — Z794 Long term (current) use of insulin: Secondary | ICD-10-CM | POA: Diagnosis not present

## 2022-05-01 DIAGNOSIS — E1165 Type 2 diabetes mellitus with hyperglycemia: Secondary | ICD-10-CM

## 2022-05-01 LAB — POCT GLYCOSYLATED HEMOGLOBIN (HGB A1C): Hemoglobin A1C: 9.6 % — AB (ref 4.0–5.6)

## 2022-05-01 MED ORDER — EMPAGLIFLOZIN 10 MG PO TABS: 10.0000 mg | ORAL_TABLET | Freq: Every day | ORAL | 3 refills | Status: DC

## 2022-05-01 MED ORDER — DEXCOM G6 SENSOR MISC: 1.0000 | 3 refills | Status: DC

## 2022-05-01 MED ORDER — LANTUS SOLOSTAR 100 UNIT/ML ~~LOC~~ SOPN: 44.0000 [IU] | PEN_INJECTOR | Freq: Every day | SUBCUTANEOUS | 3 refills | Status: DC

## 2022-05-01 MED ORDER — DEXCOM G6 TRANSMITTER MISC: 1.0000 | 3 refills | Status: DC

## 2022-05-01 MED ORDER — TRULICITY 3 MG/0.5ML ~~LOC~~ SOAJ: 3.0000 mg | SUBCUTANEOUS | 3 refills | Status: DC

## 2022-05-01 NOTE — Patient Instructions (Addendum)
-   Increase Trulicity to 3 mg ONCE weekly  - Increase Lantus  44  units daily  - Continue Novolog 12 units with Breakfast, Lunch and Dinner  - Start Jardiance 10 mg, 1 tablet daily       HOW TO TREAT LOW BLOOD SUGARS (Blood sugar LESS THAN 70 MG/DL) Please follow the RULE OF 15 for the treatment of hypoglycemia treatment (when your (blood sugars are less than 70 mg/dL)   STEP 1: Take 15 grams of carbohydrates when your blood sugar is low, which includes:  3-4 GLUCOSE TABS  OR 3-4 OZ OF JUICE OR REGULAR SODA OR ONE TUBE OF GLUCOSE GEL    STEP 2: RECHECK blood sugar in 15 MINUTES STEP 3: If your blood sugar is still low at the 15 minute recheck --> then, go back to STEP 1 and treat AGAIN with another 15 grams of carbohydrates.

## 2022-05-01 NOTE — Telephone Encounter (Signed)
Can you please work on the PA for Dexcom G6 on this patient ?   Thanks

## 2022-05-01 NOTE — Telephone Encounter (Signed)
Received notification from [place] regarding a prior authorization for DEXCOM G6 SENSORS. Authorization has been APPROVED from 05-01-2022 to 10-28-2022.   Per test claim, copay for 90 days supply is $0.00  Prior Approval #:[id number]

## 2022-05-02 MED ORDER — NOVOLOG FLEXPEN 100 UNIT/ML ~~LOC~~ SOPN: 12.0000 [IU] | PEN_INJECTOR | Freq: Three times a day (TID) | SUBCUTANEOUS | 3 refills | Status: DC

## 2022-05-10 ENCOUNTER — Other Ambulatory Visit (HOSPITAL_COMMUNITY): Payer: Self-pay

## 2022-05-10 ENCOUNTER — Telehealth: Payer: Self-pay

## 2022-05-10 ENCOUNTER — Encounter: Payer: Self-pay | Admitting: Student

## 2022-05-10 ENCOUNTER — Ambulatory Visit: Payer: Medicaid Other | Attending: Student | Admitting: Student

## 2022-05-10 VITALS — BP 128/70 | HR 91 | Ht 67.0 in | Wt 237.0 lb

## 2022-05-10 DIAGNOSIS — M25562 Pain in left knee: Secondary | ICD-10-CM | POA: Diagnosis present

## 2022-05-10 DIAGNOSIS — N1832 Chronic kidney disease, stage 3b: Secondary | ICD-10-CM | POA: Diagnosis not present

## 2022-05-10 DIAGNOSIS — I4821 Permanent atrial fibrillation: Secondary | ICD-10-CM | POA: Diagnosis not present

## 2022-05-10 DIAGNOSIS — M25561 Pain in right knee: Secondary | ICD-10-CM

## 2022-05-10 DIAGNOSIS — I10 Essential (primary) hypertension: Secondary | ICD-10-CM | POA: Diagnosis not present

## 2022-05-10 DIAGNOSIS — I5020 Unspecified systolic (congestive) heart failure: Secondary | ICD-10-CM | POA: Insufficient documentation

## 2022-05-10 NOTE — Progress Notes (Unsigned)
Cardiology Office Note    Date:  05/12/2022   ID:  Robin [name], DOB [birth year], MRN [id number]  PCP:  Inda Coke, PA  Cardiologist: Carlyle Dolly, MD    Chief Complaint  Patient presents with   Follow-up    4 month visit    History of Present Illness:    Robin [name] is a 56 y.o. female with past medical history of HFrEF (EF at 30 to 35% by echo in 08/2018 with similar results by echo in 05/2019 and in the setting of substance abuse and atrial fibrillation, EF improved to 65-70% by echo in 04/2020, at 50-55% by echo in 07/2020, at 40-45% in 08/2021, 25-30% in 11/2021 in the setting of atrial fibrillation with RVR), persistent atrial fibrillation (diagnosed in 04/2019 and rate-control pursued given substance use), HTN, IDDM, COPD, thoracic aortic aneurysm, normal coronaries by catheterization in 2010, prior CVA and history of substance abuse who presents to the office today for 59-month follow-up.  She was examined by myself in 12/2021 following a recent hospitalization for an acute CHF exacerbation. She reported her breathing had been stable and denied any specific exertional chest pain or palpitations. She was continued on Lasix 20 mg daily, Coreg 50 mg twice daily, Hydralazine 25 mg 3 times daily and Imdur 60 mg daily. She was not on an ACE-I/ARB/ARNI/Spiro given her variable renal function. It was recommended to repeat a limited echocardiogram once medical therapy had been optimized. In the interim, she was started on Jardiance by Endocrinology for her Type 2 DM and cardiomyopathy.   In talking with the patient and her sister today, she now lives by herself in an apartment but has caregivers help throughout the day. She does walk in the parking lot for exercise and reports having knee pain with this. Unsure of any known arthritis. She experiences bilateral arm pain at rest as well and they have been applying different topical creams with improvement. She did try taking Motrin  but this did not change her symptoms. She denies any specific exertional chest pain or palpitations. Her heart rate has been well controlled when checked at home. No recent orthopnea, PND or pitting edema. Weight has been stable on her home scales as well.  Past Medical History:  Diagnosis Date   A-fib [facility])    Abdominal aortic aneurysm (AAA) ([place])    Allergy    Anemia    Arthritis    Atrial fibrillation (HCC)    CHF (congestive heart failure) ([place])    a. EF at 30-35% by echo in 08/2018 b. EF at 35% by repeat echo in 05/2019 c. EF improved to 65-70% in 2021 d. EF at 40-45% in 08/2021.   Chronic abdominal pain    Chronic atrial fibrillation (HCC)    Cocaine abuse (HCC)    COPD (chronic obstructive pulmonary disease) (HCC)    Diabetes mellitus without complication ([place])    Essential hypertension, benign    Expressive aphasia    Expressive aphasia    post CVA   Fatty liver    GERD (gastroesophageal reflux disease)    Gout 2016   Normal coronary arteries    3/10 - following abnormal Myoview   Ovarian cyst    Stroke ([place]) 12/26/2019   Right sided weakness, and expressive aphasia   Stroke [facility])    Thoracic ascending aortic aneurysm (HCC)    4 cm 10/31/19 CTA   Type 2 diabetes mellitus ([place])    type II    Past  Surgical History:  Procedure Laterality Date   ABDOMINAL HYSTERECTOMY  09/10/2011   Procedure: HYSTERECTOMY ABDOMINAL;  Surgeon: Jonnie Kind, MD;  Location: [facility];  Service: Gynecology;  Laterality: N/A;  Abdominal hysterectomy   CESAREAN SECTION  [phone], and [place]  03/16/2020   IR ANGIO INTRA EXTRACRAN SEL INTERNAL CAROTID BILAT MOD SED  03/16/2020   IR ANGIO VERTEBRAL SEL SUBCLAVIAN INNOMINATE UNI L MOD SED  03/16/2020   IR ANGIO VERTEBRAL SEL VERTEBRAL UNI R MOD SED  03/16/2020   IR CT HEAD LTD  12/27/2019   IR PERCUTANEOUS ART THROMBECTOMY/INFUSION INTRACRANIAL INC DIAG ANGIO  12/27/2019   IR US GUIDE VASC ACCESS RIGHT   12/27/2019   IR US GUIDE VASC ACCESS RIGHT  03/16/2020   RADIOLOGY WITH ANESTHESIA N/A 12/27/2019   Procedure: IR WITH ANESTHESIA;  Surgeon: Luanne Bras, MD;  Location: [facility];  Service: Radiology;  Laterality: N/A;   SCAR REVISION  09/10/2011   Procedure: SCAR REVISION;  Surgeon: Jonnie Kind, MD;  Location: [facility];  Service: Gynecology;  Laterality: N/A;  Wide Excision of old Cicatrix   TUBAL LIGATION  1994    Current Medications: Outpatient Medications Prior to Visit  Medication Sig Dispense Refill   Accu-Chek Softclix Lancets lancets Use as instructed 100 each 12   aspirin EC 81 MG tablet Take 1 tablet (81 mg total) by mouth daily. Swallow whole. 90 tablet 3   BD PEN NEEDLE NANO U/F 32G X 4 MM MISC Inject 1 Device into the skin in the morning, at noon, in the evening, and at bedtime. 400 each 3   Blood Glucose Monitoring Suppl (ACCU-CHEK GUIDE ME) w/Device KIT 1 Device by Does not apply route 3 (three) times daily. 1 kit 0   carvedilol (COREG) 25 MG tablet Take 2 tablets (50 mg total) by mouth 2 (two) times daily with a meal. 360 tablet 3   Continuous Blood Gluc Sensor (DEXCOM G6 SENSOR) MISC 1 Device by Does not apply route as directed. 9 each 3   Continuous Blood Gluc Transmit (DEXCOM G6 TRANSMITTER) MISC 1 Device by Does not apply route as directed. 1 each 3   Dulaglutide (TRULICITY) 3 VX/7.9TJ SOPN Inject 3 mg as directed once a week. 6 mL 3   ELIQUIS 5 MG TABS tablet Take 1 tablet by mouth twice daily 180 tablet 0   empagliflozin (JARDIANCE) 10 MG TABS tablet Take 1 tablet (10 mg total) by mouth daily before breakfast. 90 tablet 3   furosemide (LASIX) 20 MG tablet TAKE 1 TABLET BY MOUTH ONCE DAILY MAY  TAKE  ADDITIONAL  20  MG  IF  WEIGHT  GAIN  IS  OVER  2  LBS  OVERNIGHT  OR  5LBS  IN  A  WEEK 105 tablet 1   gabapentin (NEURONTIN) 100 MG capsule Take 2 capsules (200 mg total) by mouth 3 (three) times daily. 180 capsule 1   glucose blood (ACCU-CHEK GUIDE) test strip 1 each by  Other route 3 (three) times daily. Use as instructed 300 each 3   hydrALAZINE (APRESOLINE) 25 MG tablet TAKE 1 TABLET BY MOUTH THREE TIMES DAILY 270 tablet 0   insulin aspart (NOVOLOG FLEXPEN) 100 UNIT/ML FlexPen Inject 12 Units into the skin 3 (three) times daily with meals. 30 mL 3   insulin glargine (LANTUS SOLOSTAR) 100 UNIT/ML Solostar Pen Inject 44 Units into the skin daily. 45 mL 3   isosorbide  mononitrate (IMDUR) 30 MG 24 hr tablet Take 2 tablets by mouth once daily 180 tablet 0   pantoprazole (PROTONIX) 40 MG tablet Take 1 tablet by mouth once daily 90 tablet 0   No facility-administered medications prior to visit.     Allergies:   Bee venom, Losartan, Naproxen, Penicillins, and Lisinopril   Social History   Socioeconomic History   Marital status: Single    Spouse name: Not on file   Number of children: Not on file   Years of education: Not on file   Highest education level: Not on file  Occupational History   Not on file  Tobacco Use   Smoking status: Former    Years: 29.00    Types: Cigarettes    Quit date: 12/2019    Years since quitting: 2.4   Smokeless tobacco: Never  Vaping Use   Vaping Use: Never used  Substance and Sexual Activity   Alcohol use: Not Currently    Comment: occ   Drug use: Not Currently   Sexual activity: Not Currently    Birth control/protection: Surgical  Other Topics Concern   Not on file  Social History Narrative   ** Merged History Encounter **       Part-time at [employer], lives in [place], [place] 3 children  Married   Social Determinants of Radio broadcast assistant Strain: Not on file  Food Insecurity: Not on file  Transportation Needs: Not on file  Physical Activity: Not on file  Stress: Not on file  Social Connections: Not on file     Family History:  The patient's family history includes Alcohol abuse in her father, mother, and son; Cirrhosis in her mother; Diabetes type II in her father and sister; Early death in  her brother and mother.   Review of Systems:    Please see the history of present illness.     All other systems reviewed and are otherwise negative except as noted above.   Physical Exam:    VS:  BP 128/70   Pulse 91   Ht $R'5\' 7"'nL$  (1.702 m)   Wt 237 lb (107.5 kg)   LMP 08/21/2011   SpO2 98%   BMI 37.12 kg/m    General: Well developed, well nourished,female appearing in no acute distress. Head: Normocephalic, atraumatic. Neck: No carotid bruits. JVD not elevated.  Lungs: Respirations regular and unlabored, without wheezes or rales.  Heart: Irregularly irregular. No S3 or S4.  No murmur, no rubs, or gallops appreciated. Abdomen: Appears non-distended. No obvious abdominal masses. Msk:  Strength and tone appear normal for age. No obvious joint deformities or effusions. Extremities: No clubbing or cyanosis. No pitting edema.  Distal pedal pulses are 2+ bilaterally. Neuro: Alert and oriented X 3. Moves all extremities spontaneously. Expressive aphasia.  Psych:  Responds to questions appropriately with a normal affect. Skin: No rashes or lesions noted  Wt Readings from Last 3 Encounters:  05/10/22 237 lb (107.5 kg)  05/01/22 237 lb (107.5 kg)  12/21/21 236 lb (107 kg)     Studies/Labs Reviewed:   EKG:  EKG is not ordered today.   Recent Labs: 06/19/2021: TSH 1.82 11/26/2021: B Natriuretic Peptide 1,106.0 11/27/2021: Magnesium 2.0 03/27/2022: ALT 10; BUN 22; Creatinine, Ser 1.87; Hemoglobin 8.4; Platelets 370; Potassium 4.5; Sodium 133   Lipid Panel    Component Value Date/Time   CHOL 58 07/05/2020 1359   TRIG 51 07/05/2020 1359   HDL 28 (L) 07/05/2020 1359   CHOLHDL  2.1 07/05/2020 1359   VLDL 26 12/27/2019 0305   LDLCALC 17 07/05/2020 1359    Additional studies/ records that were reviewed today include:   Limited Echo: 11/27/2021 IMPRESSIONS     1. Left ventricular ejection fraction, by estimation, is 25 to 30%. The  left ventricle has severely decreased  function. The left ventricle  demonstrates global hypokinesis. There is mild concentric left ventricular  hypertrophy.   2. Right ventricular systolic function is normal. The right ventricular  size is normal.   3. Left atrial size was severely dilated.   4. The mitral valve is normal in structure. No evidence of mitral valve  regurgitation. No evidence of mitral stenosis.   5. The aortic valve was not well visualized. Aortic valve regurgitation  is not visualized.   Assessment:    1. HFrEF (heart failure with reduced ejection fraction) ([place])   2. Permanent atrial fibrillation ([place])   3. Essential hypertension   4. Stage 3b chronic kidney disease (CKD) (HCC)   5. Pain in both knees, unspecified chronicity      Plan:   In order of problems listed above:  1. HFrEF - Her ejection fraction was at 25-30% by most recent echocardiogram in 11/2021 and felt to be tachycardia mediated due to atrial fibrillation with RVR at that time. Will plan for a follow-up limited echocardiogram for reassessment of her EF. If her EF remains reduced, can consider ischemic evaluation given the time-frame since prior evaluation.  - Continue current medical therapy for now with Lasix $RemoveBe'20mg'VgKJYyiHf$  daily, Coreg $RemoveBefo'50mg'SgvitDVfgYC$  BID, Hydralazine $RemoveBeforeDE'25mg'VupEAchskTtTDYr$  TID, Imdur $RemoveBe'60mg'dkvptjqGj$  daily and Jardiance $RemoveBefor'10mg'wVgcxwmsdhaK$  daily. She has not been on an ACE-I/ARB/ARNI/MRA due to her variable renal function.   2. Permanent Atrial Fibrillation - Rate-control was previously pursued given substance use but this has not been an issue since her prior CVA. Continued rate-control has been recommended given her severe atrial dilation by echocardiogram imaging. - Her heart rate is well-controlled during today's visit and has been well controlled when checked at home. Continue Coreg 50 mg twice daily for rate control. - No reports of active bleeding. Continue Eliquis 5 mg twice daily for anticoagulation which is the appropriate dose given her age, weight and renal  function.  3. HTN - Her BP is well-controlled at 128/70 during today's visit and has been well-controlled when checked at home. Continue current medication regimen with Coreg $RemoveBe'50mg'jdjXpPbhQ$  BID, Hydralazine $RemoveBeforeDE'25mg'bsBIzTgFKefovsY$  TID and Imdur $RemoveB'60mg'BNLiIrod$  daily.   4. Stage 3 CKD - Her creatinine was at 1.87 in 03/2022 which is close to her known baseline.   5. Bilateral Knee Pain - We reviewed possible referral to Orthopedics for additional evaluation but they wish to hold off on this for now. Recommended avoiding NSAIDS and taking Tylenol as needed.     Medication Adjustments/Labs and Tests Ordered: Current medicines are reviewed at length with the patient today.  Concerns regarding medicines are outlined above.  Medication changes, Labs and Tests ordered today are listed in the Patient Instructions below. Patient Instructions  Medication Instructions:   STOP MOTRIN IF STILL TAKING.  Your physician recommends that you continue on your current medications as directed. Please refer to the Current Medication list given to you today.   Labwork: None  Testing/Procedures: Your physician has requested that you have an echocardiogram. Echocardiography is a painless test that uses sound waves to create images of your heart. It provides your doctor with information about the size and shape of your heart and how well your heart's  chambers and valves are working. This procedure takes approximately one hour. There are no restrictions for this procedure.   Follow-Up: Follow up with Bernerd Pho, PA-C or Dr. Harl Bowie in 6 months.   Any Other Special Instructions Will Be Listed Below (If Applicable).     If you need a refill on your cardiac medications before your next appointment, please call your pharmacy.    Signed, [name], PA-C  05/12/2022 8:55 AM    [facility]. [address] [place], [place] [address] Phone: [phone] Fax: [phone]

## 2022-05-10 NOTE — Patient Instructions (Signed)
Medication Instructions:   STOP MOTRIN IF STILL TAKING.  Your physician recommends that you continue on your current medications as directed. Please refer to the Current Medication list given to you today.   Labwork: None  Testing/Procedures: Your physician has requested that you have an echocardiogram. Echocardiography is a painless test that uses sound waves to create images of your heart. It provides your doctor with information about the size and shape of your heart and how well your heart's chambers and valves are working. This procedure takes approximately one hour. There are no restrictions for this procedure.   Follow-Up: Follow up with Bernerd Pho, PA-C or Dr. Harl Bowie in 6 months.   Any Other Special Instructions Will Be Listed Below (If Applicable).     If you need a refill on your cardiac medications before your next appointment, please call your pharmacy.

## 2022-05-10 NOTE — Telephone Encounter (Signed)
Patient Advocate Encounter  Prior Authorization for Trulicity '3MG'$ /0.5ML pen-injectors has been approved.    PA# [id number]  Effective dates: 05/10/22 through 05/10/23

## 2022-05-10 NOTE — Telephone Encounter (Signed)
Patient Advocate Encounter   Received notification that prior authorization for Trulicity '3MG'$ /0.5ML pen-injectors is required/requested.   PA submitted on 05/10/22 to [facility] Medicaid via [employer] [facility] Tracks Confirmation # [id number]  Status is pending

## 2022-05-11 ENCOUNTER — Encounter: Payer: Self-pay | Admitting: Student

## 2022-05-12 ENCOUNTER — Encounter: Payer: Self-pay | Admitting: Student

## 2022-05-17 ENCOUNTER — Other Ambulatory Visit (HOSPITAL_COMMUNITY): Payer: Self-pay

## 2022-05-17 ENCOUNTER — Telehealth: Payer: Self-pay

## 2022-05-17 NOTE — Telephone Encounter (Signed)
Patient Advocate Encounter  Received notification that prior authorization is required for Jardiance '10MG'$  tablets. PA submitted and APPROVED on 05/17/2022 through [employer].  KeyShan [name] Prior approval # [id number] Effective: 05/17/2022 - 05/17/2023

## 2022-05-26 ENCOUNTER — Other Ambulatory Visit: Payer: Self-pay | Admitting: Physician Assistant

## 2022-06-02 ENCOUNTER — Other Ambulatory Visit: Payer: Self-pay | Admitting: Physician Assistant

## 2022-06-04 NOTE — Telephone Encounter (Signed)
Pt requesting refill for Gabapentin 100 mg. Last OV 12/15/2021.

## 2022-06-11 ENCOUNTER — Other Ambulatory Visit: Payer: Self-pay | Admitting: Physician Assistant

## 2022-06-11 DIAGNOSIS — I10 Essential (primary) hypertension: Secondary | ICD-10-CM

## 2022-06-18 ENCOUNTER — Telehealth: Payer: Self-pay | Admitting: Pharmacy Technician

## 2022-06-18 ENCOUNTER — Other Ambulatory Visit (HOSPITAL_COMMUNITY): Payer: Self-pay

## 2022-06-18 MED ORDER — INSULIN LISPRO (1 UNIT DIAL) 100 UNIT/ML (KWIKPEN): 12.0000 [IU] | PEN_INJECTOR | Freq: Three times a day (TID) | SUBCUTANEOUS | 3 refills | Status: DC

## 2022-06-18 NOTE — Telephone Encounter (Signed)
Pharmacy Patient Advocate Encounter   Received notification from [facility] that Novolog is not covered on the formulary.  Letter states Humalog, Lyumjev preferred by the ins.   Per Test Claim: Both are $0  If suggested medication is appropriate, please send in new rx.  Thanks.

## 2022-06-18 NOTE — Addendum Note (Signed)
Addended by: Dorita Sciara on: 06/18/2022 03:38 PM   Modules accepted: Orders

## 2022-06-21 ENCOUNTER — Other Ambulatory Visit: Payer: Self-pay | Admitting: Student

## 2022-06-25 ENCOUNTER — Other Ambulatory Visit: Payer: Self-pay | Admitting: Physician Assistant

## 2022-06-25 DIAGNOSIS — I10 Essential (primary) hypertension: Secondary | ICD-10-CM

## 2022-07-01 ENCOUNTER — Other Ambulatory Visit: Payer: Self-pay | Admitting: Physician Assistant

## 2022-07-18 ENCOUNTER — Encounter: Payer: Self-pay | Admitting: *Deleted

## 2022-08-05 ENCOUNTER — Other Ambulatory Visit: Payer: Self-pay | Admitting: Family Medicine

## 2022-08-11 ENCOUNTER — Other Ambulatory Visit: Payer: Self-pay | Admitting: Student

## 2022-08-14 ENCOUNTER — Ambulatory Visit (INDEPENDENT_AMBULATORY_CARE_PROVIDER_SITE_OTHER): Payer: 59 | Admitting: Physician Assistant

## 2022-08-14 ENCOUNTER — Encounter: Payer: Self-pay | Admitting: Physician Assistant

## 2022-08-14 ENCOUNTER — Ambulatory Visit (INDEPENDENT_AMBULATORY_CARE_PROVIDER_SITE_OTHER)
Admission: RE | Admit: 2022-08-14 | Discharge: 2022-08-14 | Disposition: A | Discharge status: Home / Self Care | Payer: 59 | Source: Ambulatory Visit | Attending: Physician Assistant | Admitting: Physician Assistant

## 2022-08-14 ENCOUNTER — Telehealth: Payer: Self-pay

## 2022-08-14 VITALS — BP 108/78 | HR 113 | Temp 97.7°F | Ht 67.0 in | Wt 231.2 lb

## 2022-08-14 DIAGNOSIS — M545 Low back pain, unspecified: Secondary | ICD-10-CM

## 2022-08-14 DIAGNOSIS — Z1211 Encounter for screening for malignant neoplasm of colon: Secondary | ICD-10-CM | POA: Diagnosis not present

## 2022-08-14 DIAGNOSIS — R4701 Aphasia: Secondary | ICD-10-CM

## 2022-08-14 DIAGNOSIS — N939 Abnormal uterine and vaginal bleeding, unspecified: Secondary | ICD-10-CM

## 2022-08-14 LAB — POCT URINALYSIS DIPSTICK
Bilirubin, UA: NEGATIVE
Blood, UA: NEGATIVE
Glucose, UA: POSITIVE — AB
Ketones, UA: NEGATIVE
Leukocytes, UA: NEGATIVE
Nitrite, UA: NEGATIVE
Protein, UA: NEGATIVE
Spec Grav, UA: <=1.005 — AB (ref 1.010–1.025)
Urobilinogen, UA: 0.2 E.U./dL
pH, UA: 6 (ref 5.0–8.0)

## 2022-08-14 MED ORDER — DOXYCYCLINE HYCLATE 100 MG PO TABS: 100.0000 mg | ORAL_TABLET | Freq: Two times a day (BID) | ORAL | 0 refills | Status: DC

## 2022-08-14 NOTE — Progress Notes (Signed)
Robin [name] is a 57 y.o. female here for a follow up of a pre-existing problem.  History of Present Illness:   Chief Complaint  Patient presents with   Vaginal Bleeding    Pt c/o vaginal spotting and boil on right side of labia, having some pain. Also having low back pain.    HPI  Vaginal bleeding; Severe back pain Patient reports light pink vaginal spotting that began x5 days ago. x3 days ago her home aid noticed a boil on her pubic region. Patient is not sexually active. She reports severe pain with urination, she feels that this is associated with the boil. She notes accompanying severe low back pain. Her pain is worsened with lying down. She has been taking extra strength Tylenol and using Lidoderm patches.  She spends most of her day sitting/sleeping in her lift chair. She states that she keeps her legs elevated in the chair. She reports that the chair is a little bit comfortable. She has home health to sit with her from 9A-1P and again from 4P-8P. However, she does not do any form of therapy. Denies chest pain, SOB, weakness, new incontinence.  Expressive Aphasia Family is requesting ST for her aphasia. She has done this in the past and feels like it was helpful     Past Medical History:  Diagnosis Date   A-fib [facility])    Abdominal aortic aneurysm (AAA) ([place])    Allergy    Anemia    Arthritis    Atrial fibrillation (HCC)    CHF (congestive heart failure) ([place])    a. EF at 30-35% by echo in 08/2018 b. EF at 35% by repeat echo in 05/2019 c. EF improved to 65-70% in 2021 d. EF at 40-45% in 08/2021.   Chronic abdominal pain    Chronic atrial fibrillation (HCC)    Cocaine abuse (HCC)    COPD (chronic obstructive pulmonary disease) (HCC)    Diabetes mellitus without complication (HCC)    Essential hypertension, benign    Expressive aphasia    Expressive aphasia    post CVA   Fatty liver    GERD (gastroesophageal reflux disease)    Gout 2016   Normal coronary  arteries    3/10 - following abnormal Myoview   Ovarian cyst    Stroke ([place]) 12/26/2019   Right sided weakness, and expressive aphasia   Stroke [facility])    Thoracic ascending aortic aneurysm (HCC)    4 cm 10/31/19 CTA   Type 2 diabetes mellitus ([place])    type II     Social History   Tobacco Use   Smoking status: Former    Years: 29.00    Types: Cigarettes    Quit date: 12/2019    Years since quitting: 2.6   Smokeless tobacco: Never  Vaping Use   Vaping Use: Never used  Substance Use Topics   Alcohol use: Not Currently    Comment: occ   Drug use: Not Currently    Past Surgical History:  Procedure Laterality Date   ABDOMINAL HYSTERECTOMY  09/10/2011   Procedure: HYSTERECTOMY ABDOMINAL;  Surgeon: Jonnie Kind, MD;  Location: [facility];  Service: Gynecology;  Laterality: N/A;  Abdominal hysterectomy   CESAREAN SECTION  1610,9604, and [place]   IR 3D INDEPENDENT WKST  03/16/2020   IR ANGIO INTRA EXTRACRAN SEL INTERNAL CAROTID BILAT MOD SED  03/16/2020   IR ANGIO VERTEBRAL SEL SUBCLAVIAN INNOMINATE UNI L MOD SED  03/16/2020  IR ANGIO VERTEBRAL SEL VERTEBRAL UNI R MOD SED  03/16/2020   IR CT HEAD LTD  12/27/2019   IR PERCUTANEOUS ART THROMBECTOMY/INFUSION INTRACRANIAL INC DIAG ANGIO  12/27/2019   IR US GUIDE VASC ACCESS RIGHT  12/27/2019   IR US GUIDE VASC ACCESS RIGHT  03/16/2020   RADIOLOGY WITH ANESTHESIA N/A 12/27/2019   Procedure: IR WITH ANESTHESIA;  Surgeon: Luanne Bras, MD;  Location: [place];  Service: Radiology;  Laterality: N/A;   SCAR REVISION  09/10/2011   Procedure: SCAR REVISION;  Surgeon: Jonnie Kind, MD;  Location: [facility];  Service: Gynecology;  Laterality: N/A;  Wide Excision of old Cicatrix   TUBAL LIGATION  1994    Family History  Problem Relation Age of Onset   Cirrhosis Mother    Early death Mother    Alcohol abuse Mother    Diabetes type II Father    Alcohol abuse Father    Diabetes type II Sister    Early death Brother     Alcohol abuse Son    Anesthesia problems Neg Hx    Hypotension Neg Hx    Malignant hyperthermia Neg Hx    Pseudochol deficiency Neg Hx    Colon cancer Neg Hx    Colon polyps Neg Hx    Esophageal cancer Neg Hx    Rectal cancer Neg Hx    Stomach cancer Neg Hx     Allergies  Allergen Reactions   Bee Venom Shortness Of Breath and Swelling    Bodily Swelling   Losartan Other (See Comments)    Nosebleeds per patient report.    Naproxen Other (See Comments)    Acid reflux   Penicillins Nausea Only    Has patient had a PCN reaction causing immediate rash, facial/tongue/throat swelling, SOB or lightheadedness with hypotension: no Has patient had a PCN reaction causing severe rash involving mucus membranes or skin necrosis: no Has patient had a PCN reaction that required hospitalization no Has patient had a PCN reaction occurring within the last 10 years: no If all of the above answers are "NO", then may proceed with Cephalosporin    Lisinopril     Sinus congestion    Current Medications:   Current Outpatient Medications:    Accu-Chek Softclix Lancets lancets, Use as instructed, Disp: 100 each, Rfl: 12   aspirin EC 81 MG tablet, Take 1 tablet (81 mg total) by mouth daily. Swallow whole., Disp: 90 tablet, Rfl: 3   BD PEN NEEDLE NANO U/F 32G X 4 MM MISC, Inject 1 Device into the skin in the morning, at noon, in the evening, and at bedtime., Disp: 400 each, Rfl: 3   Blood Glucose Monitoring Suppl (ACCU-CHEK GUIDE ME) w/Device KIT, 1 Device by Does not apply route 3 (three) times daily., Disp: 1 kit, Rfl: 0   carvedilol (COREG) 25 MG tablet, Take 2 tablets (50 mg total) by mouth 2 (two) times daily with a meal., Disp: 360 tablet, Rfl: 3   Continuous Blood Gluc Sensor (DEXCOM G6 SENSOR) MISC, 1 Device by Does not apply route as directed., Disp: 9 each, Rfl: 3   Continuous Blood Gluc Transmit (DEXCOM G6 TRANSMITTER) MISC, 1 Device by Does not apply route as directed., Disp: 1 each, Rfl: 3    Dulaglutide (TRULICITY) 3 AS/3.4HD SOPN, Inject 3 mg as directed once a week., Disp: 6 mL, Rfl: 3   ELIQUIS 5 MG TABS tablet, Take 1 tablet by mouth twice daily, Disp: 180 tablet, Rfl: 0  empagliflozin (JARDIANCE) 10 MG TABS tablet, Take 1 tablet (10 mg total) by mouth daily before breakfast., Disp: 90 tablet, Rfl: 3   furosemide (LASIX) 20 MG tablet, TAKE 1 TABLET BY MOUTH ONCE DAILY (MAY TAKE ADDITIONAL 20 MG IF WEIGHT GAIN IS OVER 2LBS OVERNIGHT OR 5 LBS IN A WEEK), Disp: 105 tablet, Rfl: 3   gabapentin (NEURONTIN) 100 MG capsule, TAKE 2 CAPSULES BY MOUTH THREE TIMES DAILY, Disp: 180 capsule, Rfl: 0   glucose blood (ACCU-CHEK GUIDE) test strip, 1 each by Other route 3 (three) times daily. Use as instructed, Disp: 300 each, Rfl: 3   hydrALAZINE (APRESOLINE) 25 MG tablet, TAKE 1 TABLET BY MOUTH THREE TIMES DAILY, Disp: 270 tablet, Rfl: 0   insulin glargine (LANTUS SOLOSTAR) 100 UNIT/ML Solostar Pen, Inject 44 Units into the skin daily., Disp: 45 mL, Rfl: 3   insulin lispro (HUMALOG KWIKPEN) 100 UNIT/ML KwikPen, Inject 12 Units into the skin 3 (three) times daily., Disp: 45 mL, Rfl: 3   isosorbide mononitrate (IMDUR) 30 MG 24 hr tablet, Take 2 tablets by mouth once daily, Disp: 180 tablet, Rfl: 0   pantoprazole (PROTONIX) 40 MG tablet, Take 1 tablet by mouth once daily, Disp: 90 tablet, Rfl: 1   timolol (TIMOPTIC) 0.5 % ophthalmic solution, Place 1 drop into both eyes 2 (two) times daily., Disp: , Rfl:    Review of Systems:   Review of Systems  Constitutional:  Negative for fever.  HENT:  Negative for congestion, sinus pain and sore throat.   Respiratory:  Negative for cough, shortness of breath and wheezing.   Cardiovascular:  Negative for chest pain and palpitations.  Gastrointestinal:  Negative for blood in stool, constipation, diarrhea, nausea and vomiting.  Genitourinary:  Positive for dysuria, flank pain and hematuria (vaginal spotting). Negative for frequency.  Musculoskeletal:   Positive for back pain. Negative for joint pain and myalgias.  Skin:        + boil right side of labia    Vitals:   Vitals:   08/14/22 0927  BP: 108/78  Pulse: (!) 113  Temp: 97.7 F (36.5 C)  TempSrc: Temporal  SpO2: 97%  Weight: 231 lb 4 oz (104.9 kg)  Height: '5\' 7"'$  (1.702 m)     Body mass index is 36.22 kg/m.  Physical Exam:   Physical Exam Exam conducted with a chaperone present.  Constitutional:      Appearance: Normal appearance. She is well-developed.  HENT:     Head: Normocephalic and atraumatic.  Eyes:     General: Lids are normal.     Extraocular Movements: Extraocular movements intact.     Conjunctiva/sclera: Conjunctivae normal.  Pulmonary:     Effort: Pulmonary effort is normal.  Genitourinary:    Comments: Approximately 1 cm raised lesion to pubic area - significant TTP; no fluctuance, slight induration  No obvious vaginal bleeding on my exam Musculoskeletal:        General: Normal range of motion.     Cervical back: Normal range of motion and neck supple.     Comments: Decreased ROM 2/2 pain with flexion/extension, lateral side bends, or rotation. Reproducible tenderness with minimal palpation to bilateral paraspinal muscles. No bony tenderness. No evidence of erythema, rash or ecchymosis.   No CVA tenderness  Skin:    General: Skin is warm and dry.  Neurological:     Mental Status: She is alert and oriented to [place], place, and time.  Psychiatric:        Attention  and Perception: Attention and perception normal.        Mood and Affect: Mood normal.        Speech: Speech is delayed.        Behavior: Behavior normal.        Thought Content: Thought content normal.        Judgment: Judgment normal.    Results for orders placed or performed in visit on 08/14/22  POCT urinalysis dipstick  Result Value Ref Range   Color, UA yellow    Clarity, UA clear    Glucose, UA Positive (A) Negative   Bilirubin, UA Negative    Ketones, UA Negative     Spec Grav, UA <=1.005 (A) 1.010 - 1.025   Blood, UA Negative    pH, UA 6.0 5.0 - 8.0   Protein, UA Negative Negative   Urobilinogen, UA 0.2 0.2 or 1.0 E.U./dL   Nitrite, UA Negative    Leukocytes, UA Negative Negative   Appearance     Odor       Assessment and Plan:   Vaginal spotting Suspect related to boil She is in such severe pain and area does not appear amenable to I&D so we will start oral doxycyline at this time to cover for infection Close follow-up if any new/worsening sx  Acute bilateral low back pain without sciatica Uncontrolled Pain seems out of proportion to exam I recommended urgent sports medicine evaluation today for close evaluation however patient and family member declined; no neurological deficit Family agreeable to xray Unable to use NSAIDs due to blood thinner; avoid prednisone due to blood sugars Consider topical lidocaine patches Start PT Low threshold to go to the ER if any new/worsening sx  Special screening for malignant neoplasms, colon Will place referral  Expressive aphasia Family is requesting ST -- will place home health order   I,Alexis Herring,acting as a scribe for [employer], PA.,have documented all relevant documentation on the behalf of Inda Coke, PA,as directed by  Inda Coke, PA while in the presence of Inda Coke, [place].  I, Inda Coke, [place], have reviewed all documentation for this visit. The documentation on 08/14/22 for the exam, diagnosis, procedures, and orders are all accurate and complete.  Time spent with patient today was 60 minutes which consisted of chart review, discussing diagnosis, work up, treatment answering questions and documentation.    Inda Coke, PA-C

## 2022-08-14 NOTE — Telephone Encounter (Signed)
Patient states that pharmacy is still saying that the Trulicity needs to be approved. I show message that medication was approved in October. Can we see what is going on?

## 2022-08-14 NOTE — Patient Instructions (Signed)
It was great to see you!  Take the doxycycline for your boil Trial warm compresses  An order for back xray has been put in for you. To have this done, you can walk in at the [facility] location without a scheduled appointment.  The address is [address] It is across the street from [facility]. Lab and x-xray are located in the basement.   Hours of operation are M-F 8:30am to 5:00pm.  Please note that they are closed for lunch between 12:30 and 1:00pm.  If any worsening pain, please go to the ER for this  I will place referral for speech therapy and physical therapy home health  Take care,  Inda Coke PA-C

## 2022-08-15 ENCOUNTER — Other Ambulatory Visit (HOSPITAL_COMMUNITY): Payer: Self-pay

## 2022-08-15 LAB — URINE CULTURE
MICRO NUMBER:: 14413302
Result:: NO GROWTH
SPECIMEN QUALITY:: ADEQUATE

## 2022-08-16 ENCOUNTER — Other Ambulatory Visit: Payer: Self-pay | Admitting: *Deleted

## 2022-08-16 DIAGNOSIS — M545 Low back pain, unspecified: Secondary | ICD-10-CM

## 2022-08-23 ENCOUNTER — Telehealth: Payer: Self-pay | Admitting: Pharmacy Technician

## 2022-08-23 ENCOUNTER — Other Ambulatory Visit (HOSPITAL_COMMUNITY): Payer: Self-pay

## 2022-08-23 NOTE — Telephone Encounter (Signed)
Pharmacy Patient Advocate Encounter  Prior Authorization for Trulicity '3mg'$  has been approved.    PA# PA Case ID: [id number] Effective dates:  through 08/05/23

## 2022-08-23 NOTE — Telephone Encounter (Signed)
Patient advised and will give [place] a call if needed.

## 2022-08-23 NOTE — Telephone Encounter (Addendum)
Pharmacy Patient Advocate Encounter   Received notification from [place] that prior authorization for Trulicity '3mg'$  isn't going through. [name] ins has changed to [facility] AARP(wam) id # [id number]   PA submitted on 08/23/21 to (ins) [employer] Part D/ via CoverMyMeds [facility] PA Case ID: [id number] Status is pending

## 2022-08-27 ENCOUNTER — Encounter: Payer: Self-pay | Admitting: Family Medicine

## 2022-08-28 ENCOUNTER — Other Ambulatory Visit: Payer: Self-pay | Admitting: Internal Medicine

## 2022-08-28 DIAGNOSIS — E1165 Type 2 diabetes mellitus with hyperglycemia: Secondary | ICD-10-CM

## 2022-09-02 ENCOUNTER — Other Ambulatory Visit: Payer: Self-pay | Admitting: Physician Assistant

## 2022-09-05 ENCOUNTER — Other Ambulatory Visit: Payer: Self-pay | Admitting: Physician Assistant

## 2022-09-09 ENCOUNTER — Telehealth: Payer: Self-pay | Admitting: Physician Assistant

## 2022-09-09 MED ORDER — DOXYCYCLINE HYCLATE 100 MG PO TABS: 100.0000 mg | ORAL_TABLET | Freq: Two times a day (BID) | ORAL | 0 refills | Status: DC

## 2022-09-09 NOTE — Telephone Encounter (Signed)
Pts boil is back, they need a new RX. Please advise.

## 2022-09-09 NOTE — Telephone Encounter (Signed)
Please see message and advise 

## 2022-09-09 NOTE — Telephone Encounter (Signed)
Spoke to pt's sister [name], asked her if the boil ever resolve or did the prior antibiotic just temporarily help? [name] said no, it completely resolved and is now back again. Told her I will check with Aldona Bar to see if okay to send antibiotic in again and get back to you. [name] verbalized understanding.   Called [name] back told her discussed with Aldona Bar okay to send in antibiotic same as last time.  I will send it now. [name] verbalized understanding. Rx sent.

## 2022-09-15 ENCOUNTER — Emergency Department (HOSPITAL_COMMUNITY)
Admission: EM | Admit: 2022-09-15 | Discharge: 2022-09-15 | Disposition: A | Discharge status: Home / Self Care | Payer: 59 | Attending: Emergency Medicine | Admitting: Emergency Medicine

## 2022-09-15 ENCOUNTER — Emergency Department (HOSPITAL_COMMUNITY): Payer: 59

## 2022-09-15 ENCOUNTER — Other Ambulatory Visit: Payer: Self-pay

## 2022-09-15 ENCOUNTER — Encounter (HOSPITAL_COMMUNITY): Payer: Self-pay

## 2022-09-15 DIAGNOSIS — Z1152 Encounter for screening for COVID-19: Secondary | ICD-10-CM | POA: Insufficient documentation

## 2022-09-15 DIAGNOSIS — J449 Chronic obstructive pulmonary disease, unspecified: Secondary | ICD-10-CM | POA: Diagnosis not present

## 2022-09-15 DIAGNOSIS — I509 Heart failure, unspecified: Secondary | ICD-10-CM | POA: Diagnosis not present

## 2022-09-15 DIAGNOSIS — R0602 Shortness of breath: Secondary | ICD-10-CM | POA: Diagnosis present

## 2022-09-15 DIAGNOSIS — E119 Type 2 diabetes mellitus without complications: Secondary | ICD-10-CM | POA: Diagnosis not present

## 2022-09-15 DIAGNOSIS — Z794 Long term (current) use of insulin: Secondary | ICD-10-CM | POA: Insufficient documentation

## 2022-09-15 DIAGNOSIS — D649 Anemia, unspecified: Secondary | ICD-10-CM | POA: Insufficient documentation

## 2022-09-15 LAB — CBC WITH DIFFERENTIAL/PLATELET
Abs Immature Granulocytes: 0.02 10*3/uL (ref 0.00–0.07)
Basophils Absolute: 0 10*3/uL (ref 0.0–0.1)
Basophils Relative: 0 %
Eosinophils Absolute: 0.2 10*3/uL (ref 0.0–0.5)
Eosinophils Relative: 3 %
HCT: 28.5 % — ABNORMAL LOW (ref 36.0–46.0)
Hemoglobin: 7.8 g/dL — ABNORMAL LOW (ref 12.0–15.0)
Immature Granulocytes: 0 %
Lymphocytes Relative: 30 %
Lymphs Abs: 2.2 10*3/uL (ref 0.7–4.0)
MCH: 18.5 pg — ABNORMAL LOW (ref 26.0–34.0)
MCHC: 27.4 g/dL — ABNORMAL LOW (ref 30.0–36.0)
MCV: 67.7 fL — ABNORMAL LOW (ref 80.0–100.0)
Monocytes Absolute: 0.5 10*3/uL (ref 0.1–1.0)
Monocytes Relative: 7 %
Neutro Abs: 4.3 10*3/uL (ref 1.7–7.7)
Neutrophils Relative %: 60 %
Platelets: 399 10*3/uL (ref 150–400)
RBC: 4.21 MIL/uL (ref 3.87–5.11)
RDW: 18 % — ABNORMAL HIGH (ref 11.5–15.5)
WBC: 7.3 10*3/uL (ref 4.0–10.5)
nRBC: 0 % (ref 0.0–0.2)

## 2022-09-15 LAB — COMPREHENSIVE METABOLIC PANEL
ALT: 9 U/L (ref 0–44)
AST: 17 U/L (ref 15–41)
Albumin: 3.6 g/dL (ref 3.5–5.0)
Alkaline Phosphatase: 88 U/L (ref 38–126)
Anion gap: 10 (ref 5–15)
BUN: 24 mg/dL — ABNORMAL HIGH (ref 6–20)
CO2: 24 mmol/L (ref 22–32)
Calcium: 8.9 mg/dL (ref 8.9–10.3)
Chloride: 103 mmol/L (ref 98–111)
Creatinine, Ser: 2.12 mg/dL — ABNORMAL HIGH (ref 0.44–1.00)
GFR, Estimated: 27 mL/min — ABNORMAL LOW (ref >60)
Glucose, Bld: 156 mg/dL — ABNORMAL HIGH (ref 70–99)
Potassium: 4.6 mmol/L (ref 3.5–5.1)
Sodium: 137 mmol/L (ref 135–145)
Total Bilirubin: 0.5 mg/dL (ref 0.3–1.2)
Total Protein: 7.6 g/dL (ref 6.5–8.1)

## 2022-09-15 LAB — URINALYSIS, W/ REFLEX TO CULTURE (INFECTION SUSPECTED)
Bilirubin Urine: NEGATIVE
Glucose, UA: >=500 mg/dL — AB
Hgb urine dipstick: NEGATIVE
Ketones, ur: NEGATIVE mg/dL
Nitrite: NEGATIVE
Protein, ur: NEGATIVE mg/dL
Specific Gravity, Urine: 1.02 (ref 1.005–1.030)
pH: 5 (ref 5.0–8.0)

## 2022-09-15 LAB — RESP PANEL BY RT-PCR (RSV, FLU A&B, COVID)  RVPGX2
Influenza A by PCR: NEGATIVE
Influenza B by PCR: NEGATIVE
Resp Syncytial Virus by PCR: NEGATIVE
SARS Coronavirus 2 by RT PCR: NEGATIVE

## 2022-09-15 LAB — POC OCCULT BLOOD, ED: Fecal Occult Bld: NEGATIVE

## 2022-09-15 MED ORDER — FERROUS SULFATE 325 (65 FE) MG PO TABS: 325.0000 mg | ORAL_TABLET | Freq: Every day | ORAL | 0 refills | Status: DC

## 2022-09-15 NOTE — ED Triage Notes (Signed)
Pt c/o sob that started getting worse yesterday, pt poor historian,

## 2022-09-15 NOTE — ED Provider Notes (Addendum)
[facility] Provider Note   CSN: [id number] Arrival date & time: 09/15/22  1337     History  Chief Complaint  Patient presents with   Shortness of Breath    [place] Platek Wethington is a 57 y.o. female.   Shortness of Breath Patient presents with shortness of breath weakness.  Reportedly feeling bad.  Hurting all over.  States aches all over.  No known sick contacts.  Previous stroke with right-sided deficits.  No confusion.  No known fever.    Past Medical History:  Diagnosis Date   A-fib [facility])    Abdominal aortic aneurysm (AAA) ([place])    Allergy    Anemia    Arthritis    Atrial fibrillation (HCC)    CHF (congestive heart failure) ([place])    a. EF at 30-35% by echo in 08/2018 b. EF at 35% by repeat echo in 05/2019 c. EF improved to 65-70% in 2021 d. EF at 40-45% in 08/2021.   Chronic abdominal pain    Chronic atrial fibrillation (HCC)    Cocaine abuse (HCC)    COPD (chronic obstructive pulmonary disease) (HCC)    Diabetes mellitus without complication ([place])    Essential hypertension, benign    Expressive aphasia    Expressive aphasia    post CVA   Fatty liver    GERD (gastroesophageal reflux disease)    Gout 2016   Normal coronary arteries    3/10 - following abnormal Myoview   Ovarian cyst    Stroke ([place]) 12/26/2019   Right sided weakness, and expressive aphasia   Stroke [facility]    Thoracic ascending aortic aneurysm (HCC)    4 cm 10/31/19 CTA   Type 2 diabetes mellitus ([place])    type II    Home Medications Prior to Admission medications   Medication Sig Start Date End Date Taking? Authorizing Provider  Accu-Chek Softclix Lancets lancets Use as instructed 06/13/21   Shamleffer, Melanie Crazier, MD  aspirin EC 81 MG tablet Take 1 tablet (81 mg total) by mouth daily. Swallow whole. 07/19/20   Strader, Fransisco Hertz, PA-C  BD PEN NEEDLE NANO U/F 32G X 4 MM MISC Inject 1 Device into the skin in the morning, at noon, in the evening, and  at bedtime. 06/13/21   Shamleffer, Melanie Crazier, MD  Blood Glucose Monitoring Suppl (ACCU-CHEK GUIDE ME) w/Device KIT 1 Device by Does not apply route 3 (three) times daily. 03/09/21   Shamleffer, Melanie Crazier, MD  carvedilol (COREG) 25 MG tablet Take 2 tablets (50 mg total) by mouth 2 (two) times daily with a meal. 03/22/22   Branch, Alphonse Guild, MD  Continuous Blood Gluc Sensor (DEXCOM G6 SENSOR) MISC 1 Device by Does not apply route as directed. 05/01/22   Shamleffer, Melanie Crazier, MD  Continuous Blood Gluc Transmit (DEXCOM G6 TRANSMITTER) MISC 1 Device by Does not apply route as directed. 05/01/22   Shamleffer, Melanie Crazier, MD  doxycycline (VIBRA-TABS) 100 MG tablet Take 1 tablet (100 mg total) by mouth 2 (two) times daily. TIMES 10 DAYS 09/09/22   Inda Coke, PA  Dulaglutide (TRULICITY) 3 0000000 SOPN Inject 3 mg as directed once a week. 05/01/22   Shamleffer, Melanie Crazier, MD  ELIQUIS 5 MG TABS tablet Take 1 tablet by mouth twice daily 09/05/22   Inda Coke, PA  empagliflozin (JARDIANCE) 10 MG TABS tablet Take 1 tablet (10 mg total) by mouth daily before breakfast. 05/01/22   Shamleffer, Melanie Crazier, MD  furosemide (LASIX) 20 MG tablet TAKE 1 TABLET BY MOUTH ONCE DAILY (MAY TAKE ADDITIONAL 20 MG IF WEIGHT GAIN IS OVER 2LBS OVERNIGHT OR 5 LBS IN A WEEK) 08/12/22   Arnoldo Lenis, MD  gabapentin (NEURONTIN) 100 MG capsule TAKE 2 CAPSULES BY MOUTH THREE TIMES DAILY 09/02/22   Inda Coke, PA  glucose blood (ACCU-CHEK GUIDE) test strip USE 1 STRIP TO CHECK GLUCOSE THREE TIMES DAILY 08/28/22   Shamleffer, Melanie Crazier, MD  hydrALAZINE (APRESOLINE) 25 MG tablet TAKE 1 TABLET BY MOUTH THREE TIMES DAILY 06/11/22   Inda Coke, PA  insulin glargine (LANTUS SOLOSTAR) 100 UNIT/ML Solostar Pen Inject 44 Units into the skin daily. 05/01/22   Shamleffer, Melanie Crazier, MD  insulin lispro (HUMALOG KWIKPEN) 100 UNIT/ML KwikPen Inject 12 Units into the skin 3 (three) times daily.  06/18/22   Shamleffer, Melanie Crazier, MD  isosorbide mononitrate (IMDUR) 30 MG 24 hr tablet Take 2 tablets by mouth once daily 06/11/22   Inda Coke, PA  pantoprazole (PROTONIX) 40 MG tablet Take 1 tablet by mouth once daily 06/25/22   Inda Coke, PA  timolol (TIMOPTIC) 0.5 % ophthalmic solution Place 1 drop into both eyes 2 (two) times daily. 07/05/22   [provider]      Allergies    Bee venom, Losartan, Naproxen, Penicillins, and Lisinopril    Review of Systems   Review of Systems  Respiratory:  Positive for shortness of breath.     Physical Exam Updated Vital Signs BP 109/69   Pulse 99   Temp 97.9 F (36.6 C) (Oral)   Resp 20   Ht 5' 7"$  (1.702 m)   Wt 95.3 kg   LMP 08/21/2011   SpO2 97%   BMI 32.91 kg/m  Physical Exam Vitals reviewed.  Cardiovascular:     Rate and Rhythm: Normal rate. Rhythm irregular.  Pulmonary:     Breath sounds: No wheezing or rhonchi.  Chest:     Chest wall: Tenderness present.  Abdominal:     Tenderness: There is abdominal tenderness.  Musculoskeletal:     Cervical back: Neck supple.     Right lower leg: Tenderness present. No edema.     Left lower leg: Tenderness present. No edema.  Neurological:     Mental Status: She is alert.     ED Results / Procedures / Treatments   Labs (all labs ordered are listed, but only abnormal results are displayed) Labs Reviewed  COMPREHENSIVE METABOLIC PANEL - Abnormal; Notable for the following components:      Result Value   Glucose, Bld 156 (*)    BUN 24 (*)    Creatinine, Ser 2.12 (*)    GFR, Estimated 27 (*)    All other components within normal limits  RESP PANEL BY RT-PCR (RSV, FLU A&B, COVID)  [id number]  URINALYSIS, W/ REFLEX TO CULTURE (INFECTION SUSPECTED)  CBC WITH DIFFERENTIAL/PLATELET    EKG EKG Interpretation  Date/Time:  Sunday September 15 2022 14:12:14 EST Ventricular Rate:  104 PR Interval:    QRS Duration: 83 QT Interval:  383 QTC Calculation: 507 R  Axis:   22 Text Interpretation: Atrial flutter Low voltage, precordial leads Borderline prolonged QT interval Confirmed by Davonna Belling [phone]) on 09/15/2022 2:16:43 PM  Radiology No results found.  Procedures Procedures    Medications Ordered in ED Medications - No data to display  ED Course/ Medical Decision Making/ A&P  Medical Decision Making Amount and/or Complexity of Data Reviewed Labs: ordered. Radiology: ordered.   Patient reports feeling bad.  Has been feeling bad for a day or 2.  States she aches all over.  No numbness or weakness.  Feels short of breath but no cough.  No nausea or vomiting.  Differential diagnosis is long with these vague complaints.  Will get basic blood work.  His tenderness is mostly over everywhere I palpate.  Not febrile.  Will get viral testing basic blood work.  In A-fib which is chronic for the patient. Care turned over to Dr. Gilford Raid.    CBC comes back with a worsening anemia.  Somewhat chronically low.  Guaiac negative.        Final Clinical Impression(s) / ED Diagnoses Final diagnoses:  None    Rx / DC Orders ED Discharge Orders     None         Davonna Belling, MD 09/15/22 1458    Davonna Belling, MD 09/15/22 [phone]

## 2022-09-15 NOTE — ED Notes (Addendum)
This nurse went to assess the patient. Patient will not respond to most questions. Patient did state hurting all over. When asked to rate her pain significant other states patient is having a 9/10 pain. Significant other at bedside answering questions for patient. Bilateral lung sounds normal. Significant other states that the patient had a stroke a few years ago effecting her right side of body.

## 2022-09-15 NOTE — ED Provider Notes (Signed)
Pt signed out by Dr. Alvino Chapel pending UA.  UA without evidence of infection.  Pt is anemic, but has been anemic for several months.  She said no one has ever told her that she's anemic.  I don't know if that is true or not as she carries a dx of anemia.  She is on Eliquis, but is not having any active bleeding.  Pt is not on iron and mcv is low, so I will start her on iron and have her f/u with her pcp.  Return if worse.   [name], MD 09/15/22 1756

## 2022-09-15 NOTE — ED Notes (Signed)
Patient given water at this time.  

## 2022-09-27 ENCOUNTER — Other Ambulatory Visit: Payer: Self-pay | Admitting: Physician Assistant

## 2022-09-27 DIAGNOSIS — I10 Essential (primary) hypertension: Secondary | ICD-10-CM

## 2022-10-02 ENCOUNTER — Other Ambulatory Visit: Payer: Self-pay | Admitting: Physician Assistant

## 2022-10-02 NOTE — Telephone Encounter (Signed)
Pt requesting refill for Gabapentin. Last OV 08/14/2022.

## 2022-10-11 ENCOUNTER — Emergency Department (HOSPITAL_COMMUNITY)
Admission: EM | Admit: 2022-10-11 | Discharge: 2022-10-11 | Disposition: A | Discharge status: Home / Self Care | Payer: 59 | Attending: Emergency Medicine | Admitting: Emergency Medicine

## 2022-10-11 ENCOUNTER — Other Ambulatory Visit: Payer: Self-pay

## 2022-10-11 ENCOUNTER — Encounter (HOSPITAL_COMMUNITY): Payer: Self-pay | Admitting: Emergency Medicine

## 2022-10-11 DIAGNOSIS — Z87891 Personal history of nicotine dependence: Secondary | ICD-10-CM | POA: Insufficient documentation

## 2022-10-11 DIAGNOSIS — L299 Pruritus, unspecified: Secondary | ICD-10-CM | POA: Insufficient documentation

## 2022-10-11 DIAGNOSIS — E119 Type 2 diabetes mellitus without complications: Secondary | ICD-10-CM | POA: Diagnosis not present

## 2022-10-11 DIAGNOSIS — I509 Heart failure, unspecified: Secondary | ICD-10-CM | POA: Diagnosis not present

## 2022-10-11 DIAGNOSIS — J449 Chronic obstructive pulmonary disease, unspecified: Secondary | ICD-10-CM | POA: Diagnosis not present

## 2022-10-11 DIAGNOSIS — Z8673 Personal history of transient ischemic attack (TIA), and cerebral infarction without residual deficits: Secondary | ICD-10-CM | POA: Diagnosis not present

## 2022-10-11 DIAGNOSIS — M79606 Pain in leg, unspecified: Secondary | ICD-10-CM | POA: Diagnosis not present

## 2022-10-11 MED ORDER — CETAPHIL MOISTURIZING EX LOTN: 1.0000 | TOPICAL_LOTION | CUTANEOUS | 0 refills | Status: DC | PRN

## 2022-10-11 NOTE — ED Triage Notes (Signed)
Pt c/o left shin pain and itching for 2 days.

## 2022-10-11 NOTE — Discharge Instructions (Signed)
You were evaluated in the Emergency Department and after careful evaluation, we did not find any emergent condition requiring admission or further testing in the hospital.  Your exam/testing today is overall reassuring.  Itching may be related to dry skin.  Use the lotion provided as needed.  Recommend follow-up with primary care doctor or dermatologist.  Please return to the Emergency Department if you experience any worsening of your condition.   Thank you for allowing [place] to be a part of your care.

## 2022-10-11 NOTE — ED Notes (Signed)
ED Provider at bedside. 

## 2022-10-11 NOTE — ED Provider Notes (Addendum)
[facility] Emergency Department Provider Note MRN:  [id number]  Arrival date & time: 10/11/22     Chief Complaint   Leg Pain   History of Present Illness   Robin [name] is a 57 y.o. year-old female with a history of A-fib, AAA, CHF, cocaine use, COPD presenting to the ED with chief complaint of leg pain.  Patient explains that her leg does not hurt but there is a patch of itchiness to the left shin.  Present for 2 days.  Denies pain or rash or fever or any other complaints.  Review of Systems  A thorough review of systems was obtained and all systems are negative except as noted in the HPI and PMH.   Patient's Health History    Past Medical History:  Diagnosis Date   A-fib [facility])    Abdominal aortic aneurysm (AAA) ([place])    Allergy    Anemia    Arthritis    Atrial fibrillation (HCC)    CHF (congestive heart failure) ([place])    a. EF at 30-35% by echo in 08/2018 b. EF at 35% by repeat echo in 05/2019 c. EF improved to 65-70% in 2021 d. EF at 40-45% in 08/2021.   Chronic abdominal pain    Chronic atrial fibrillation (HCC)    Cocaine abuse (HCC)    COPD (chronic obstructive pulmonary disease) (HCC)    Diabetes mellitus without complication ([place])    Essential hypertension, benign    Expressive aphasia    Expressive aphasia    post CVA   Fatty liver    GERD (gastroesophageal reflux disease)    Gout 2016   Normal coronary arteries    3/10 - following abnormal Myoview   Ovarian cyst    Stroke ([place]) 12/26/2019   Right sided weakness, and expressive aphasia   Stroke [facility])    Thoracic ascending aortic aneurysm (HCC)    4 cm 10/31/19 CTA   Type 2 diabetes mellitus ([place])    type II    Past Surgical History:  Procedure Laterality Date   ABDOMINAL HYSTERECTOMY  09/10/2011   Procedure: HYSTERECTOMY ABDOMINAL;  Surgeon: Jonnie Kind, MD;  Location: [facility];  Service: Gynecology;  Laterality: N/A;  Abdominal hysterectomy   CESAREAN SECTION  [phone],  and [place]  03/16/2020   IR ANGIO INTRA EXTRACRAN SEL INTERNAL CAROTID BILAT MOD SED  03/16/2020   IR ANGIO VERTEBRAL SEL SUBCLAVIAN INNOMINATE UNI L MOD SED  03/16/2020   IR ANGIO VERTEBRAL SEL VERTEBRAL UNI R MOD SED  03/16/2020   IR CT HEAD LTD  12/27/2019   IR PERCUTANEOUS ART THROMBECTOMY/INFUSION INTRACRANIAL INC DIAG ANGIO  12/27/2019   IR US GUIDE VASC ACCESS RIGHT  12/27/2019   IR US GUIDE VASC ACCESS RIGHT  03/16/2020   RADIOLOGY WITH ANESTHESIA N/A 12/27/2019   Procedure: IR WITH ANESTHESIA;  Surgeon: Luanne Bras, MD;  Location: [place];  Service: Radiology;  Laterality: N/A;   SCAR REVISION  09/10/2011   Procedure: SCAR REVISION;  Surgeon: Jonnie Kind, MD;  Location: [facility];  Service: Gynecology;  Laterality: N/A;  Wide Excision of old [place]   TUBAL LIGATION  1994    Family History  Problem Relation Age of Onset   Cirrhosis Mother    Early death Mother    Alcohol abuse Mother    Diabetes type II Father    Alcohol abuse Father    Diabetes type II  Sister    Early death Brother    Alcohol abuse Son    Anesthesia problems Neg Hx    Hypotension Neg Hx    Malignant hyperthermia Neg Hx    Pseudochol deficiency Neg Hx    Colon cancer Neg Hx    Colon polyps Neg Hx    Esophageal cancer Neg Hx    Rectal cancer Neg Hx    Stomach cancer Neg Hx     Social History   Socioeconomic History   Marital status: Single    Spouse name: Not on file   Number of children: Not on file   Years of education: Not on file   Highest education level: Not on file  Occupational History   Not on file  Tobacco Use   Smoking status: Former    Years: 29.00    Types: Cigarettes    Quit date: 12/2019    Years since quitting: 2.8   Smokeless tobacco: Never  Vaping Use   Vaping Use: Never used  Substance and Sexual Activity   Alcohol use: Not Currently    Comment: occ   Drug use: Not Currently   Sexual activity: Not Currently    Birth  control/protection: Surgical  Other Topics Concern   Not on file  Social History Narrative   ** Merged History Encounter **       Part-time at [employer], lives in [place], [place] 3 children  Married   Social Determinants of Health   Financial Resource Strain: Not on file  Food Insecurity: Not on file  Transportation Needs: Not on file  Physical Activity: Not on file  Stress: Not on file  Social Connections: Not on file  Intimate Partner Violence: Not on file     Physical Exam   Vitals:   10/11/22 0619  BP: (!) 136/94  Pulse: 89  Resp: 16  Temp: 97.8 F (36.6 C)  SpO2: 100%    CONSTITUTIONAL: Well-appearing, NAD NEURO/PSYCH: Alert and oriented to name, seems to have a cognitive impairment EYES:  eyes equal and reactive ENT/NECK:  no LAD, no JVD CARDIO: Regular rate, well-perfused, normal S1 and S2 PULM:  CTAB no wheezing or rhonchi GI/GU:  non-distended, non-tender MSK/SPINE:  No gross deformities, no edema SKIN:  no rash, atraumatic   *Additional and/or pertinent findings included in MDM below  Diagnostic and Interventional Summary    EKG Interpretation  Date/Time:    Ventricular Rate:    PR Interval:    QRS Duration:   QT Interval:    QTC Calculation:   R Axis:     Text Interpretation:         Labs Reviewed - No data to display  No orders to display    Medications - No data to display   Procedures  /  Critical Care Procedures  ED Course and Medical Decision Making  Initial Impression and Ddx No signs of infection, no signs of swelling or erythema or increased warmth, highly doubt DVT.  The skin appears that maybe it is a bit dry in this area.  She also has a hyperpigmented mole that is a bit large in this same area.  Advised dermatology follow-up.  Neurological deficits or changes at this time.  Past medical/surgical history that increases complexity of ED encounter: Stroke, COPD  Interpretation of Diagnostics Laboratory and/or imaging  options to aid in the diagnosis/care of the patient were considered.  After careful history and physical examination, it was determined that there was no  indication for diagnostics at this time. Patient Reassessment and Ultimate Disposition/Management     Discharge  Patient management required discussion with the following services or consulting groups:  None  Complexity of Problems Addressed Acute complicated illness or Injury  Additional Data Reviewed and Analyzed Further history obtained from: Further history from spouse/family member  Additional Factors Impacting ED Encounter Risk Prescriptions  Barth Kirks. Sedonia Small, MD [place] [email or web address]  Final Clinical Impressions(s) / ED Diagnoses     ICD-10-CM   1. Itchy skin  L29.9       ED Discharge Orders          Ordered    cetaphil (CETAPHIL) lotion  As needed        10/11/22 [id number]             Discharge Instructions Discussed with and Provided to Patient:    Discharge Instructions      You were evaluated in the Emergency Department and after careful evaluation, we did not find any emergent condition requiring admission or further testing in the hospital.  Your exam/testing today is overall reassuring.  Itching may be related to dry skin.  Use the lotion provided as needed.  Recommend follow-up with primary care doctor or dermatologist.  Please return to the Emergency Department if you experience any worsening of your condition.   Thank you for allowing [place] to be a part of your care.      Maudie Flakes, MD 10/11/22 [id number]    Maudie Flakes, MD 10/11/22 [phone]

## 2022-10-15 ENCOUNTER — Other Ambulatory Visit: Payer: Self-pay

## 2022-10-15 MED ORDER — NOVOLOG FLEXPEN 100 UNIT/ML ~~LOC~~ SOPN: 12.0000 [IU] | PEN_INJECTOR | Freq: Three times a day (TID) | SUBCUTANEOUS | 1 refills | Status: DC

## 2022-10-22 ENCOUNTER — Telehealth: Payer: Self-pay | Admitting: Physician Assistant

## 2022-10-22 ENCOUNTER — Telehealth: Payer: Self-pay

## 2022-10-22 DIAGNOSIS — M545 Low back pain, unspecified: Secondary | ICD-10-CM

## 2022-10-22 DIAGNOSIS — R4701 Aphasia: Secondary | ICD-10-CM

## 2022-10-22 NOTE — Telephone Encounter (Signed)
PA needed on Novolog per fax request from [facility]

## 2022-10-22 NOTE — Telephone Encounter (Signed)
Spoke to pt's daughter Robin [name], told her I did not call pt. Robin [name] said they are waiting to hear about scheduling PT and Speech therapy. Told her I will check with the [facility] who does PT in our office and get back to you. Robin [name] verbalized understanding.

## 2022-10-22 NOTE — Telephone Encounter (Signed)
Asked Leda Quail who does or PT if she contacted pt? Leda Quail said no and pt can not be seen her with our Physical therapist due to Medicaid. Told her okay.  Discussed with [facility] referrals. Did you want PT and Speech therapy cause they are not ordered. Aldona Bar said yes. Told her okay will place orders. Orders were placed in Epic.

## 2022-10-22 NOTE — Telephone Encounter (Signed)
Spoke to pt's daughter [name], told her I placed new referrals for PT and Speech therapy someone will contact you to schedule appointments. [name] verbalized understanding and said can she not have therapy at home like she did before. Told her there are no Home Health agency's that will come to the house do to PT and Speech therapy. [name] verbalized understanding.

## 2022-10-22 NOTE — Telephone Encounter (Signed)
Pt would like a call back. She states someone had called from our office and wanted her to call back but she did not state why. Please advise.

## 2022-10-24 ENCOUNTER — Other Ambulatory Visit (HOSPITAL_COMMUNITY): Payer: Self-pay

## 2022-10-24 NOTE — Telephone Encounter (Signed)
Novolog was sent on 10/15/22

## 2022-10-24 NOTE — Telephone Encounter (Signed)
Pharmacy Patient Advocate Encounter  Received notification from Pt calls msgs/cma that prior authorization for Novolog is required/requested.  Per Test Claim: Novolog  preferred by the ins.   Please advise if suggested medication is appropriate.

## 2022-10-30 ENCOUNTER — Ambulatory Visit (INDEPENDENT_AMBULATORY_CARE_PROVIDER_SITE_OTHER): Payer: 59 | Admitting: Internal Medicine

## 2022-10-30 ENCOUNTER — Other Ambulatory Visit: Payer: Self-pay | Admitting: *Deleted

## 2022-10-30 ENCOUNTER — Other Ambulatory Visit: Payer: Self-pay

## 2022-10-30 ENCOUNTER — Encounter: Payer: Self-pay | Admitting: Internal Medicine

## 2022-10-30 VITALS — BP 114/72 | HR 70 | Ht 67.0 in | Wt 232.0 lb

## 2022-10-30 DIAGNOSIS — E1122 Type 2 diabetes mellitus with diabetic chronic kidney disease: Secondary | ICD-10-CM

## 2022-10-30 DIAGNOSIS — E1165 Type 2 diabetes mellitus with hyperglycemia: Secondary | ICD-10-CM

## 2022-10-30 DIAGNOSIS — N184 Chronic kidney disease, stage 4 (severe): Secondary | ICD-10-CM

## 2022-10-30 DIAGNOSIS — Z794 Long term (current) use of insulin: Secondary | ICD-10-CM

## 2022-10-30 DIAGNOSIS — E1143 Type 2 diabetes mellitus with diabetic autonomic (poly)neuropathy: Secondary | ICD-10-CM | POA: Diagnosis not present

## 2022-10-30 DIAGNOSIS — E1159 Type 2 diabetes mellitus with other circulatory complications: Secondary | ICD-10-CM

## 2022-10-30 LAB — POCT GLYCOSYLATED HEMOGLOBIN (HGB A1C): Hemoglobin A1C: 7.5 % — AB (ref 4.0–5.6)

## 2022-10-30 MED ORDER — TRULICITY 3 MG/0.5ML ~~LOC~~ SOAJ: 3.0000 mg | SUBCUTANEOUS | 3 refills | Status: DC

## 2022-10-30 MED ORDER — NOVOLOG FLEXPEN 100 UNIT/ML ~~LOC~~ SOPN: 12.0000 [IU] | PEN_INJECTOR | Freq: Three times a day (TID) | SUBCUTANEOUS | 3 refills | Status: DC

## 2022-10-30 MED ORDER — EMPAGLIFLOZIN 10 MG PO TABS: 10.0000 mg | ORAL_TABLET | Freq: Every day | ORAL | 3 refills | Status: DC

## 2022-10-30 MED ORDER — LANTUS SOLOSTAR 100 UNIT/ML ~~LOC~~ SOPN: 44.0000 [IU] | PEN_INJECTOR | Freq: Every day | SUBCUTANEOUS | 3 refills | Status: DC

## 2022-10-30 MED ORDER — INSULIN LISPRO (1 UNIT DIAL) 100 UNIT/ML (KWIKPEN): PEN_INJECTOR | SUBCUTANEOUS | 3 refills | Status: DC

## 2022-10-30 MED ORDER — BD PEN NEEDLE NANO U/F 32G X 4 MM MISC: 1.0000 | Freq: Four times a day (QID) | 3 refills | Status: DC

## 2022-10-30 MED ORDER — FERROUS SULFATE 325 (65 FE) MG PO TABS: 325.0000 mg | ORAL_TABLET | Freq: Every day | ORAL | 5 refills | Status: DC

## 2022-10-30 NOTE — Progress Notes (Signed)
Name: Robin [name]  Age/ Sex: 57 y.o., female   MRN/ DOB: [id number], 1966/07/09     PCP: Inda Coke, PA   Reason for Endocrinology Evaluation: Type 2 Diabetes Mellitus  Initial Endocrine Consultative Visit: 10/01/2018    PATIENT IDENTIFIER: Robin [name] is a 57 y.o. female with a past medical history of T2DM, CHF, COPD , CVA, A.Fib with RVR and GERD. The patient has followed with Endocrinology clinic since 10/01/2018 for consultative assistance with management of her diabetes.  DIABETIC HISTORY:  Robin [name] was diagnosed with DM > 20 yrs ago. Has been on Janumet and Glipizide in the past. Has been on insulin for years. Her hemoglobin A1c has ranged from 7.9% in 2017, peaking at 11.3% in [id number]  Started trulicity [id number] Started  Jardiance 04/2022  SUBJECTIVE:   During the last visit (05/01/2021): A1c 9.6%  Today (10/30/2022): Robin [name] is here for a follow up on diabetes management.  She is accompanied by her sister today.  She checks glucose 3x  a day. No hypoglycemia    Since her last visit here she has been multiple times to the ED with SOB and leg pains  No nausea or vomiting  Denies diarrhea or any side effects to jardiance     HOME DIABETES REGIMEN:  Jardiance 10 mg daily  Trulicity 3 mg weekly  Lantus 44 units daily NovoLog 12 units 3 times daily before every meal    METER DOWNLOAD SUMMARY: unable to download 90 day average 240 mg/dL  184- 251  mg/dL    DIABETIC COMPLICATIONS: Microvascular complications:  Neuropathy Denies: CKD, retinopathy  Last eye exam: Completed many years ago    Macrovascular complications:  CVA [id number] Denies: CAD, PVA    HISTORY:  Past Medical History:  Past Medical History:  Diagnosis Date   A-fib ([place])    Abdominal aortic aneurysm (AAA) ([place])    Allergy    Anemia    Arthritis    Atrial fibrillation ([place])    CHF (congestive heart failure) ([place])    a. EF at 30-35% by echo in 08/2018 b. EF at 35% by repeat  echo in 05/2019 c. EF improved to 65-70% in 2021 d. EF at 40-45% in 08/2021.   Chronic abdominal pain    Chronic atrial fibrillation (HCC)    Cocaine abuse (HCC)    COPD (chronic obstructive pulmonary disease) (HCC)    Diabetes mellitus without complication ([place])    Essential hypertension, benign    Expressive aphasia    Expressive aphasia    post CVA   Fatty liver    GERD (gastroesophageal reflux disease)    Gout 2016   Normal coronary arteries    3/10 - following abnormal Myoview   Ovarian cyst    Stroke ([place]) 12/26/2019   Right sided weakness, and expressive aphasia   Stroke [facility])    Thoracic ascending aortic aneurysm (HCC)    4 cm 10/31/19 CTA   Type 2 diabetes mellitus ([place])    type II   Past Surgical History:  Past Surgical History:  Procedure Laterality Date   ABDOMINAL HYSTERECTOMY  09/10/2011   Procedure: HYSTERECTOMY ABDOMINAL;  Surgeon: Jonnie Kind, MD;  Location: [facility];  Service: Gynecology;  Laterality: N/A;  Abdominal hysterectomy   CESAREAN SECTION  [id number], and [place]  03/16/2020   IR ANGIO INTRA EXTRACRAN SEL INTERNAL CAROTID BILAT MOD SED  03/16/2020   IR  ANGIO VERTEBRAL SEL SUBCLAVIAN INNOMINATE UNI L MOD SED  03/16/2020   IR ANGIO VERTEBRAL SEL VERTEBRAL UNI R MOD SED  03/16/2020   IR CT HEAD LTD  12/27/2019   IR PERCUTANEOUS ART THROMBECTOMY/INFUSION INTRACRANIAL INC DIAG ANGIO  12/27/2019   IR US GUIDE VASC ACCESS RIGHT  12/27/2019   IR US GUIDE VASC ACCESS RIGHT  03/16/2020   RADIOLOGY WITH ANESTHESIA N/A 12/27/2019   Procedure: IR WITH ANESTHESIA;  Surgeon: Luanne Bras, MD;  Location: [facility];  Service: Radiology;  Laterality: N/A;   SCAR REVISION  09/10/2011   Procedure: SCAR REVISION;  Surgeon: Jonnie Kind, MD;  Location: [facility];  Service: Gynecology;  Laterality: N/A;  Wide Excision of old [place]   Social History:  reports that she quit smoking about 2 years ago. Her  smoking use included cigarettes. She has never used smokeless tobacco. She reports that she does not currently use alcohol. She reports that she does not currently use drugs. Family History:  Family History  Problem Relation Age of Onset   Cirrhosis Mother    Early death Mother    Alcohol abuse Mother    Diabetes type II Father    Alcohol abuse Father    Diabetes type II Sister    Early death Brother    Alcohol abuse Son    Anesthesia problems Neg Hx    Hypotension Neg Hx    Malignant hyperthermia Neg Hx    Pseudochol deficiency Neg Hx    Colon cancer Neg Hx    Colon polyps Neg Hx    Esophageal cancer Neg Hx    Rectal cancer Neg Hx    Stomach cancer Neg Hx      HOME MEDICATIONS: Allergies as of 10/30/2022       Reactions   Bee Venom Shortness Of Breath, Swelling   Bodily Swelling   Losartan Other (See Comments)   Nosebleeds per patient report.    Naproxen Other (See Comments)   Acid reflux   Penicillins Nausea Only   Has patient had a PCN reaction causing immediate rash, facial/tongue/throat swelling, SOB or lightheadedness with hypotension: no Has patient had a PCN reaction causing severe rash involving mucus membranes or skin necrosis: no Has patient had a PCN reaction that required hospitalization no Has patient had a PCN reaction occurring within the last 10 years: no If all of the above answers are "NO", then may proceed with Cephalosporin    Lisinopril    Sinus congestion        Medication List        Accurate as of October 30, 2022  9:20 AM. If you have any questions, ask your nurse or doctor.          STOP taking these medications    doxycycline 100 MG tablet Commonly known as: VIBRA-TABS Stopped by: Dorita Sciara, MD   insulin lispro 100 UNIT/ML KwikPen Commonly known as: HumaLOG KwikPen Stopped by: Dorita Sciara, MD       TAKE these medications    Accu-Chek Guide Me w/Device Kit 1 Device by Does not apply route 3 (three)  times daily.   Accu-Chek Guide test strip Generic drug: glucose blood USE 1 STRIP TO CHECK GLUCOSE THREE TIMES DAILY   Accu-Chek Softclix Lancets lancets Use as instructed   aspirin EC 81 MG tablet Take 1 tablet (81 mg total) by mouth daily. Swallow whole.   BD Pen Needle Nano U/F 32G  X 4 MM Misc Generic drug: Insulin Pen Needle Inject 1 Device into the skin in the morning, at noon, in the evening, and at bedtime.   carvedilol 25 MG tablet Commonly known as: COREG Take 2 tablets (50 mg total) by mouth 2 (two) times daily with a meal.   cetaphil lotion Apply 1 Application topically as needed for dry skin.   Dexcom G6 Sensor Misc 1 Device by Does not apply route as directed.   Dexcom G6 Transmitter Misc 1 Device by Does not apply route as directed.   Eliquis 5 MG Tabs tablet Generic drug: apixaban Take 1 tablet by mouth twice daily   empagliflozin 10 MG Tabs tablet Commonly known as: Jardiance Take 1 tablet (10 mg total) by mouth daily before breakfast.   ferrous sulfate 325 (65 FE) MG tablet Take 1 tablet (325 mg total) by mouth daily.   furosemide 20 MG tablet Commonly known as: LASIX TAKE 1 TABLET BY MOUTH ONCE DAILY (MAY TAKE ADDITIONAL 20 MG IF WEIGHT GAIN IS OVER 2LBS OVERNIGHT OR 5 LBS IN A WEEK)   gabapentin 100 MG capsule Commonly known as: NEURONTIN TAKE 2 CAPSULES BY MOUTH THREE TIMES DAILY   hydrALAZINE 25 MG tablet Commonly known as: APRESOLINE TAKE 1 TABLET BY MOUTH THREE TIMES DAILY   isosorbide mononitrate 30 MG 24 hr tablet Commonly known as: IMDUR Take 2 tablets by mouth once daily   Lantus SoloStar 100 UNIT/ML Solostar Pen Generic drug: insulin glargine Inject 44 Units into the skin daily.   NovoLOG FlexPen 100 UNIT/ML FlexPen Generic drug: insulin aspart Inject 12 Units into the skin 3 (three) times daily with meals.   pantoprazole 40 MG tablet Commonly known as: PROTONIX Take 1 tablet by mouth once daily   timolol 0.5 % ophthalmic  solution Commonly known as: TIMOPTIC Place 1 drop into both eyes 2 (two) times daily.   Trulicity 3 0000000 Sopn Generic drug: Dulaglutide Inject 3 mg as directed once a week.         OBJECTIVE:   Vital Signs: BP 114/72 (BP Location: Right Arm, Patient Position: Sitting, Cuff Size: Large)   Pulse 70   Ht 5\' 7"  (1.702 m)   Wt 232 lb (105.2 kg)   LMP 08/21/2011   SpO2 98%   BMI 36.34 kg/m   Wt Readings from Last 3 Encounters:  10/30/22 232 lb (105.2 kg)  10/11/22 210 lb 1.6 oz (95.3 kg)  09/15/22 210 lb 1.6 oz (95.3 kg)     Exam: General: Pt appears well and is in NAD  Lungs: Clear with good BS bilat with no rales, rhonchi, or wheezes  Heart: RRR   Extremities: No pretibial edema.   Neuro: MS is good with appropriate affect, pt is alert and Ox3    DM foot exam:10/30/2022  The skin of the feet is intact without sores or ulcerations. The pedal pulses are 2+ The sensation is decreased  to a screening 5.07, 10 gram monofilament bilaterally        DATA REVIEWED:  Lab Results  Component Value Date   HGBA1C 7.5 (A) 10/30/2022   HGBA1C 9.6 (A) 05/01/2022   HGBA1C 12.3 (A) 10/24/2021     Latest Reference Range & Units 09/15/22 14:34  Sodium 135 - 145 mmol/L 137  Potassium 3.5 - 5.1 mmol/L 4.6  Chloride 98 - 111 mmol/L 103  CO2 22 - 32 mmol/L 24  Glucose 70 - 99 mg/dL 156 (H)  BUN 6 - 20 mg/dL 24 (H)  Creatinine 0.44 - 1.00 mg/dL 2.12 (H)  Calcium 8.9 - 10.3 mg/dL 8.9  Anion gap 5 - 15  10  Alkaline Phosphatase 38 - 126 U/L 88  Albumin 3.5 - 5.0 g/dL 3.6  AST 15 - 41 U/L 17  ALT 0 - 44 U/L 9  Total Protein 6.5 - 8.1 g/dL 7.6     ASSESSMENT / PLAN / RECOMMENDATIONS:   1) Type 2 Diabetes Mellitus, Sub-optimally  controlled, With Neuropathic and CKD IV and macrovascular complications - Most recent A1c of  7.5  %. Goal A1c < 7.0 %.    - A1c trended down from  9.6 %  to 7.5 % -Metformin has been stopped due to low GFR -Main barriers to diabetes self  care is memory issues and CVA -She was able to obtain the Dexcom, awaiting training, sister will go online for training, I was able to show them briefly the technique  MEDICATIONS: Continue  Lantus  44 units daily Continue NovoLog 12 units with each meal Continue  trulicity 3 mg weekly  Continue  Jardiance 10 mg daily  EDUCATION / INSTRUCTIONS: BG monitoring instructions: Patient is instructed to check her blood sugars 2 times a day, fasting and supper. Call [place] [facility] if: BG persistently < 70  I reviewed the Rule of 15 for the treatment of hypoglycemia in detail with the patient. Literature supplied.   2) Diabetic complications:  Eye: Does not have known diabetic retinopathy.   Neuro/ Feet: Does  have known diabetic peripheral neuropathy .  Renal: Patient does  have known baseline CKD        F/U in 6 months    Signed electronically by: Mack Guise, MD  [facility] Endocrinology  [address] [place], [place] [address] Phone: [phone] FAX: [phone]   CC: Inda Coke, [place] [address] Phone: [phone]  Fax: [phone]  Return to Endocrinology clinic as below: Future Appointments  Date Time Provider [place]  11/18/2022  3:40 PM Branch, Alphonse Guild, MD [facility]

## 2022-10-30 NOTE — Patient Instructions (Signed)
-   Continue  Trulicity  3 mg ONCE weekly  - Continue Lantus  44  units daily  - Continue Novolog 12 units with Breakfast, Lunch and Dinner  - Continue Jardiance 10 mg, 1 tablet daily       HOW TO TREAT LOW BLOOD SUGARS (Blood sugar LESS THAN 70 MG/DL) Please follow the RULE OF 15 for the treatment of hypoglycemia treatment (when your (blood sugars are less than 70 mg/dL)   STEP 1: Take 15 grams of carbohydrates when your blood sugar is low, which includes:  3-4 GLUCOSE TABS  OR 3-4 OZ OF JUICE OR REGULAR SODA OR ONE TUBE OF GLUCOSE GEL    STEP 2: RECHECK blood sugar in 15 MINUTES STEP 3: If your blood sugar is still low at the 15 minute recheck --> then, go back to STEP 1 and treat AGAIN with another 15 grams of carbohydrates.

## 2022-10-31 ENCOUNTER — Other Ambulatory Visit: Payer: Self-pay | Admitting: Physician Assistant

## 2022-10-31 NOTE — Telephone Encounter (Signed)
Pt requesting refill for Gabapentin 200 mg TID. Last OV 08/2022.

## 2022-11-13 ENCOUNTER — Telehealth: Payer: Self-pay | Admitting: Cardiology

## 2022-11-13 NOTE — Telephone Encounter (Signed)
[name] sister is requesting lab orders be sent to [facility] before patient's appt.

## 2022-11-13 NOTE — Telephone Encounter (Signed)
Patients sister notified that patient had no lab work to complete for [employer]. Pt is due to have echo. Will send to schedulers to have testing scheduled.

## 2022-11-18 ENCOUNTER — Ambulatory Visit: Payer: 59 | Attending: Cardiology | Admitting: Cardiology

## 2022-11-18 ENCOUNTER — Encounter: Payer: Self-pay | Admitting: Cardiology

## 2022-11-18 VITALS — BP 98/66 | HR 84 | Ht 67.0 in | Wt 234.0 lb

## 2022-11-18 DIAGNOSIS — I4891 Unspecified atrial fibrillation: Secondary | ICD-10-CM

## 2022-11-18 DIAGNOSIS — E785 Hyperlipidemia, unspecified: Secondary | ICD-10-CM | POA: Diagnosis not present

## 2022-11-18 DIAGNOSIS — I5022 Chronic systolic (congestive) heart failure: Secondary | ICD-10-CM | POA: Diagnosis not present

## 2022-11-18 DIAGNOSIS — I10 Essential (primary) hypertension: Secondary | ICD-10-CM | POA: Diagnosis not present

## 2022-11-18 NOTE — Patient Instructions (Signed)
Medication Instructions:  Your physician recommends that you continue on your current medications as directed. Please refer to the Current Medication list given to you today.  *If you need a refill on your cardiac medications before your next appointment, please call your pharmacy*   Lab Work: CBC BMET TSH LIPID MAG A1C  If you have labs (blood work) drawn today and your tests are completely normal, you will receive your results only by: MyChart Message (if you have MyChart) OR A paper copy in the mail If you have any lab test that is abnormal or we need to change your treatment, we will call you to review the results.   Testing/Procedures: Your physician has requested that you have an echocardiogram. Echocardiography is a painless test that uses sound waves to create images of your heart. It provides your doctor with information about the size and shape of your heart and how well your heart's chambers and valves are working. This procedure takes approximately one hour. There are no restrictions for this procedure. Please do NOT wear cologne, perfume, aftershave, or lotions (deodorant is allowed). Please arrive 15 minutes prior to your appointment time.    Follow-Up: At [facility], you and your health needs are our priority.  As part of our continuing mission to provide you with exceptional heart care, we have created designated Provider Care Teams.  These Care Teams include your primary Cardiologist (physician) and Advanced Practice Providers (APPs -  Physician Assistants and Nurse Practitioners) who all work together to provide you with the care you need, when you need it.  We recommend signing up for the patient portal called "MyChart".  Sign up information is provided on this After Visit Summary.  MyChart is used to connect with patients for Virtual Visits (Telemedicine).  Patients are able to view lab/test results, encounter notes, upcoming appointments, etc.  Non-urgent  messages can be sent to your provider as well.   To learn more about what you can do with MyChart, go to [email or web address].    Your next appointment:   4 month(s)  Provider:   Dina Rich, MD    Other Instructions

## 2022-11-18 NOTE — Progress Notes (Signed)
Clinical Summary Ms. Joe is a 57 y.o.female seen today for follow up of the following medical problems.    1.Chronic HFrEF - diagnosed during Jan 2020 admission - Jan 2020 echo LVEF 30-35%, mild LVH  - Jan 2020 admission cocaine +, severe HTN with medication noncompliance   05/2020 echo LVEF 70-75% 07/2020 echo LVEF 50-55% Jan 2023 echo LVEF 40-45% -11/2021 echo LVEF 25-30% (during admit with afib with RVR, though tachy mediated   - did not go for repeat echo has not been on an ACE-I/ARB/ARNI/MRA due to her variable renal function. 09/2022 GFR was 27  2.Long standing persistent Afib - diagnosed 04/2019 - has been rate controlled. Have not attempted rhythm control due to severe LAE  - rare palpitations - no bleeding on eliquis  3. HTN - compliant with meds   4. CKD   5. History of CVA She is s/p MCA infarct in 12/2019 and required thrombectomy with TICI3 reperfusion. She is being followed by IR for possible right ICA aneurysm coiling/stenting in the future. IR did recommend we stop Brilinta for now but continue ASA  daily with Eliquis.  Past Medical History:  Diagnosis Date   A-fib [facility])    Abdominal aortic aneurysm (AAA) (HCC)    Allergy    Anemia    Arthritis    Atrial fibrillation (HCC)    CHF (congestive heart failure) (HCC)    a. EF at 30-35% by echo in 08/2018 b. EF at 35% by repeat echo in 05/2019 c. EF improved to 65-70% in 2021 d. EF at 40-45% in 08/2021.   Chronic abdominal pain    Chronic atrial fibrillation (HCC)    Cocaine abuse (HCC)    COPD (chronic obstructive pulmonary disease) (HCC)    Diabetes mellitus without complication (HCC)    Essential hypertension, benign    Expressive aphasia    Expressive aphasia    post CVA   Fatty liver    GERD (gastroesophageal reflux disease)    Gout 2016   Normal coronary arteries    3/10 - following abnormal Myoview   Ovarian cyst    Stroke (HCC) 12/26/2019   Right sided weakness, and  expressive aphasia   Stroke [facility])    Thoracic ascending aortic aneurysm (HCC)    4 cm 10/31/19 CTA   Type 2 diabetes mellitus (HCC)    type II     Allergies  Allergen Reactions   Bee Venom Shortness Of Breath and Swelling    Bodily Swelling   Losartan Other (See Comments)    Nosebleeds per patient report.    Naproxen Other (See Comments)    Acid reflux   Penicillins Nausea Only    Has patient had a PCN reaction causing immediate rash, facial/tongue/throat swelling, SOB or lightheadedness with hypotension: no Has patient had a PCN reaction causing severe rash involving mucus membranes or skin necrosis: no Has patient had a PCN reaction that required hospitalization no Has patient had a PCN reaction occurring within the last 10 years: no If all of the above answers are "NO", then may proceed with Cephalosporin    Lisinopril     Sinus congestion     Current Outpatient Medications  Medication Sig Dispense Refill   Accu-Chek Softclix Lancets lancets Use as instructed 100 each 12   aspirin EC 81 MG tablet Take 1 tablet (81 mg total) by mouth daily. Swallow whole. 90 tablet 3   Blood Glucose Monitoring Suppl (ACCU-CHEK GUIDE ME) w/Device KIT  1 Device by Does not apply route 3 (three) times daily. 1 kit 0   carvedilol (COREG) 25 MG tablet Take 2 tablets (50 mg total) by mouth 2 (two) times daily with a meal. 360 tablet 3   cetaphil (CETAPHIL) lotion Apply 1 Application topically as needed for dry skin. 236 mL 0   Continuous Blood Gluc Sensor (DEXCOM G6 SENSOR) MISC 1 Device by Does not apply route as directed. 9 each 3   Continuous Blood Gluc Transmit (DEXCOM G6 TRANSMITTER) MISC 1 Device by Does not apply route as directed. 1 each 3   Dulaglutide (TRULICITY) 3 MG/0.5ML SOPN Inject 3 mg as directed once a week. 6 mL 3   ELIQUIS 5 MG TABS tablet Take 1 tablet by mouth twice daily 180 tablet 0   empagliflozin (JARDIANCE) 10 MG TABS tablet Take 1 tablet (10 mg total) by mouth daily before  breakfast. 90 tablet 3   ferrous sulfate 325 (65 FE) MG tablet Take 1 tablet (325 mg total) by mouth daily. 30 tablet 5   furosemide (LASIX) 20 MG tablet TAKE 1 TABLET BY MOUTH ONCE DAILY (MAY TAKE ADDITIONAL 20 MG IF WEIGHT GAIN IS OVER 2LBS OVERNIGHT OR 5 LBS IN A WEEK) 105 tablet 3   gabapentin (NEURONTIN) 100 MG capsule TAKE 2 CAPSULES BY MOUTH THREE TIMES DAILY 180 capsule 0   glucose blood (ACCU-CHEK GUIDE) test strip USE 1 STRIP TO CHECK GLUCOSE THREE TIMES DAILY 300 each 3   hydrALAZINE (APRESOLINE) 25 MG tablet TAKE 1 TABLET BY MOUTH THREE TIMES DAILY 270 tablet 0   insulin aspart (NOVOLOG FLEXPEN) 100 UNIT/ML FlexPen Inject 12 Units into the skin 3 (three) times daily with meals. 45 mL 3   insulin glargine (LANTUS SOLOSTAR) 100 UNIT/ML Solostar Pen Inject 44 Units into the skin daily. 45 mL 3   insulin lispro (HUMALOG KWIKPEN) 100 UNIT/ML KwikPen Inject 12 Units into the skin 3 (three) times daily with meals 45 mL 3   Insulin Pen Needle (BD PEN NEEDLE NANO U/F) 32G X 4 MM MISC Inject 1 Device into the skin in the morning, at noon, in the evening, and at bedtime. 400 each 3   isosorbide mononitrate (IMDUR) 30 MG 24 hr tablet Take 2 tablets by mouth once daily 180 tablet 0   pantoprazole (PROTONIX) 40 MG tablet Take 1 tablet by mouth once daily 90 tablet 1   timolol (TIMOPTIC) 0.5 % ophthalmic solution Place 1 drop into both eyes 2 (two) times daily.     No current facility-administered medications for this visit.     Past Surgical History:  Procedure Laterality Date   ABDOMINAL HYSTERECTOMY  09/10/2011   Procedure: HYSTERECTOMY ABDOMINAL;  Surgeon: Tilda Burrow, MD;  Location: [facility];  Service: Gynecology;  Laterality: N/A;  Abdominal hysterectomy   CESAREAN SECTION  [phone], and 1994   CHOLECYSTECTOMY  1995   IR 3D INDEPENDENT WKST  03/16/2020   IR ANGIO INTRA EXTRACRAN SEL INTERNAL CAROTID BILAT MOD SED  03/16/2020   IR ANGIO VERTEBRAL SEL SUBCLAVIAN INNOMINATE UNI L MOD SED   03/16/2020   IR ANGIO VERTEBRAL SEL VERTEBRAL UNI R MOD SED  03/16/2020   IR CT HEAD LTD  12/27/2019   IR PERCUTANEOUS ART THROMBECTOMY/INFUSION INTRACRANIAL INC DIAG ANGIO  12/27/2019   IR US GUIDE VASC ACCESS RIGHT  12/27/2019   IR US GUIDE VASC ACCESS RIGHT  03/16/2020   RADIOLOGY WITH ANESTHESIA N/A 12/27/2019   Procedure: IR WITH ANESTHESIA;  Surgeon: Julieanne Cotton,  MD;  Location: [facility];  Service: Radiology;  Laterality: N/A;   SCAR REVISION  09/10/2011   Procedure: SCAR REVISION;  Surgeon: Tilda Burrow, MD;  Location: [facility];  Service: Gynecology;  Laterality: N/A;  Wide Excision of old Cicatrix   TUBAL LIGATION  1994     Allergies  Allergen Reactions   Bee Venom Shortness Of Breath and Swelling    Bodily Swelling   Losartan Other (See Comments)    Nosebleeds per patient report.    Naproxen Other (See Comments)    Acid reflux   Penicillins Nausea Only    Has patient had a PCN reaction causing immediate rash, facial/tongue/throat swelling, SOB or lightheadedness with hypotension: no Has patient had a PCN reaction causing severe rash involving mucus membranes or skin necrosis: no Has patient had a PCN reaction that required hospitalization no Has patient had a PCN reaction occurring within the last 10 years: no If all of the above answers are "NO", then may proceed with Cephalosporin    Lisinopril     Sinus congestion      Family History  Problem Relation Age of Onset   Cirrhosis Mother    Early death Mother    Alcohol abuse Mother    Diabetes type II Father    Alcohol abuse Father    Diabetes type II Sister    Early death Brother    Alcohol abuse Son    Anesthesia problems Neg Hx    Hypotension Neg Hx    Malignant hyperthermia Neg Hx    Pseudochol deficiency Neg Hx    Colon cancer Neg Hx    Colon polyps Neg Hx    Esophageal cancer Neg Hx    Rectal cancer Neg Hx    Stomach cancer Neg Hx      Social History Ms. Stinson reports that she quit smoking about 2  years ago. Her smoking use included cigarettes. She has never used smokeless tobacco. Ms. Dayley reports that she does not currently use alcohol.   Review of Systems CONSTITUTIONAL: No weight loss, fever, chills, weakness or fatigue.  HEENT: Eyes: No visual loss, blurred vision, double vision or yellow sclerae.No hearing loss, sneezing, congestion, runny nose or sore throat.  SKIN: No rash or itching.  CARDIOVASCULAR: per hpi RESPIRATORY: No shortness of breath, cough or sputum.  GASTROINTESTINAL: No anorexia, nausea, vomiting or diarrhea. No abdominal pain or blood.  GENITOURINARY: No burning on urination, no polyuria NEUROLOGICAL: No headache, dizziness, syncope, paralysis, ataxia, numbness or tingling in the extremities. No change in bowel or bladder control.  MUSCULOSKELETAL: No muscle, back pain, joint pain or stiffness.  LYMPHATICS: No enlarged nodes. No history of splenectomy.  PSYCHIATRIC: No history of depression or anxiety.  ENDOCRINOLOGIC: No reports of sweating, cold or heat intolerance. No polyuria or polydipsia.  Marland Kitchen   Physical Examination Today's Vitals   11/18/22 1524  BP: 98/66  Pulse: 84  SpO2: 97%  Weight: 234 lb (106.1 kg)  Height:  (1.702 m)   Body mass index is 36.65 kg/m.  Gen: resting comfortably, no acute distress HEENT: no scleral icterus, pupils equal round and reactive, no palptable cervical adenopathy,  NU:UVOZD, no mrg, no jvd Resp: Clear to auscultation bilaterally GI: abdomen is soft, non-tender, non-distended, normal bowel sounds, no hepatosplenomegaly MSK: extremities are warm, no edema.  Skin: warm, no rash Neuro:  no focal deficits Psych: appropriate affect   Diagnostic Studies  Cardiac Catheterization: 2010 ANGIOGRAPHIC DATA:  1. Ventriculography done in  the RAO projection revealed preserved      global systolic function.  2. Aortic root aortography revealed a normal aorta.  3. On plain fluoroscopy, there was some evidence of  calcification over      the surface of the heart, the exact location unclear, as it did not      appear to be attached to the right coronary artery.  4. The right coronary artery is a small codominant vessel providing a      small posterior descending Kit Mollett.  It is smooth and without      critical narrowing.  5. The left main is free of critical disease.  6. The left anterior descending artery is a large-caliber vessel that      courses to the apex.  The mid and distal LAD appeared to be normal      without significant obstruction.  Likewise, the proximal LAD      appears to be widely patent in multiple views.  There is an      overlapping proximal Adrianne Shackleton which may constitute a small combined      septal diagonal and there is also a small-to-moderate diagonal      which comes off and takes a steep bend proximally and overlaps the      proximal vessel.  Despite multiple views, this is somewhat      difficult to see, but the mid and distal portion of this appears to      be quite smooth.  The proximal bend is difficult to visualize in      almost any view, but on careful analysis high-grade obstruction is      not particularly noted.  7. The circumflex provides a codominant system with several large      marginal branches.  In laying this out carefully, particularly in      the LAO caudal views, high-grade obstruction proximally is not      seen.  The distal vessels are without critical narrowing.    CONCLUSIONS:  1. Preserved left ventricular function.  2. No significant high-grade obstruction noted with 2 proximal      diagonal branches that overlap as noted in the above study.  3. No evidence of aortic dissection.    RECOMMENDATIONS:  We will have the patient follow up with Dr. Daleen Squibb for  continued followup.  I spent extensive time with the patient explaining  vascular biology, smoking, diabetes, hypertension and their interaction  and a potential for ACS down the road.  Risk factor  reduction will  clearly be necessary, and it will imperative that the patient  understands this.  I have reviewed it in extensive detail with her.  She  will follow up with Dr. Daleen Squibb.   Echocardiogram: 07/2016 Study Conclusions   - Left ventricle: The cavity size was normal. Wall thickness was   increased in a pattern of moderate LVH. Systolic function was   normal. The estimated ejection fraction was in the range of 50%   to 55%. Diffuse hypokinesis. Doppler parameters are consistent   with restrictive physiology, indicative of decreased left   ventricular diastolic compliance and/or increased left atrial   pressure. - Aortic valve: Mildly calcified annulus. Functionally bicuspid;   mildly calcified leaflets. There was moderate stenosis. Mean   gradient (S): 14 mm Hg. Peak gradient (S): 25 mm Hg. VTI ratio of   LVOT to aortic valve: 0.32. Valve area (VTI): 1.01 cm^2. Valve   area (Vmax): 1.12 cm^2. -  Mitral valve: Mildly thickened leaflets . There was trivial   regurgitation. - Left atrium: The atrium was moderately to severely dilated. - Tricuspid valve: There was trivial regurgitation. - Pulmonary arteries: PA peak pressure: 24 mm Hg (S). - Pericardium, extracardiac: A trivial pericardial effusion was   identified posterior to the heart.   Impressions:   - Moderate LVH with LVEF 50-55%. Restrictive diastolic filling   pattern. Moderate to severe left atrial enlargement. Mildly   thickened mitral leaflets with trivial mitral regurgitation.   Functionally bicuspid aortic valve with evidence of moderate   aortic stenosis as outlined above. Trivial tricuspid   regurgitation with PASP 24 mmHg. Trivial posterior pericardial   effusion.   Echocardiogram: 08/24/2018 Study Conclusions   - Left ventricle: Diffuse hypokinesis worse in the inferior basal   and posterior lateral walls. The cavity size was mildly dilated.   Wall thickness was increased in a pattern of mild LVH.  Systolic   function was moderately to severely reduced. The estimated   ejection fraction was in the range of 30% to 35%. Doppler   parameters are consistent with both elevated ventricular   end-diastolic filling pressure and elevated left atrial filling   pressure. - Aortic valve: There was mild stenosis. - Left atrium: The atrium was moderately dilated. - Atrial septum: No defect or patent foramen ovale was identified.   Laboratory Data:   Chemistry Last Labs      Recent Labs  Lab 08/24/18 0531 08/25/18 0611  NA 134* 136  K 3.9 3.5  CL 101 101  CO2 22 26  GLUCOSE 364* 265*  BUN 15 20  CREATININE 0.81 0.89  CALCIUM 9.0 8.9  GFRNONAA >60 >60  GFRAA >60 >60  ANIONGAP 11 9      Last Labs  No results for input(s): PROT, ALBUMIN, AST, ALT, ALKPHOS, BILITOT in the last 168 hours.   Hematology Last Labs     Recent Labs  Lab 08/24/18 0531  WBC 6.9  RBC 5.27*  HGB 14.3  HCT 45.0  MCV 85.4  MCH 27.1  MCHC 31.8  RDW 13.2  PLT 230      Cardiac Enzymes Last Labs       Recent Labs  Lab 08/24/18 0531 08/24/18 0951 08/24/18 1625  TROPONINI <0.03 <0.03 <0.03      Last Labs  No results for input(s): TROPIPOC in the last 168 hours.    BNP Last Labs     Recent Labs  Lab 08/24/18 0555  BNP 700.0*      DDimer  Last Labs     Recent Labs  Lab 08/24/18 7741  DDIMER 0.54*        Radiology/Studies:  Dg Chest 2 View   Result Date: 08/24/2018 CLINICAL DATA:  Midsternal chest pain with shortness of breath for 1 day EXAM: CHEST - 2 VIEW COMPARISON:  07/07/2018 FINDINGS: Cardiomegaly with diffuse interstitial coarsening and Kerley lines. Negative aortic contours. Vascular fullness of the hila. IMPRESSION: CHF. Electronically Signed   By: Marnee Spring M.D.   On: 08/24/2018 05:43    Assessment and Plan  1.Chronic HFrEF - labile LVEF as reported above. Most recent drop in LVEF was thought to be perhaps tachy mediated - recheck echo - continue current  meds - medical therapy limited by renal dysfunction  2. Afib/acqurired thrombophilia - infrequent symptoms, continue current meds  3.HTN - at goal, cotinue current meds  4. Anemia - recheck labs    Antoine Poche, M.D.

## 2022-11-29 ENCOUNTER — Other Ambulatory Visit: Payer: Self-pay | Admitting: Physician Assistant

## 2022-11-29 DIAGNOSIS — I10 Essential (primary) hypertension: Secondary | ICD-10-CM

## 2022-11-29 NOTE — Telephone Encounter (Signed)
Pt requesting refills for the following. Last OV 08/14/2022

## 2022-12-03 LAB — HEMOGLOBIN A1C
Hgb A1c MFr Bld: 8.2 % of total Hgb — ABNORMAL HIGH (ref <5.7)
Mean Plasma Glucose: 189 mg/dL
eAG (mmol/L): 10.4 mmol/L

## 2022-12-03 LAB — BASIC METABOLIC PANEL
BUN/Creatinine Ratio: 11 (calc) (ref 6–22)
BUN: 20 mg/dL (ref 7–25)
CO2: 26 mmol/L (ref 20–32)
Calcium: 9 mg/dL (ref 8.6–10.4)
Chloride: 105 mmol/L (ref 98–110)
Creat: 1.88 mg/dL — ABNORMAL HIGH (ref 0.50–1.03)
Glucose, Bld: 168 mg/dL — ABNORMAL HIGH (ref 65–99)
Potassium: 4.5 mmol/L (ref 3.5–5.3)
Sodium: 140 mmol/L (ref 135–146)

## 2022-12-03 LAB — CBC
HCT: 40.8 % (ref 35.0–45.0)
Hemoglobin: 11.7 g/dL (ref 11.7–15.5)
MCH: 23 pg — ABNORMAL LOW (ref 27.0–33.0)
MCHC: 28.7 g/dL — ABNORMAL LOW (ref 32.0–36.0)
MCV: 80.2 fL (ref 80.0–100.0)
MPV: 10.1 fL (ref 7.5–12.5)
Platelets: 291 10*3/uL (ref 140–400)
RBC: 5.09 10*6/uL (ref 3.80–5.10)
RDW: 21.6 % — ABNORMAL HIGH (ref 11.0–15.0)
WBC: 5.6 10*3/uL (ref 3.8–10.8)

## 2022-12-03 LAB — LIPID PANEL
Cholesterol: 117 mg/dL (ref <200)
HDL: 38 mg/dL — ABNORMAL LOW (ref >=50)
LDL Cholesterol (Calc): 61 mg/dL (calc)
Non-HDL Cholesterol (Calc): 79 mg/dL (calc) (ref <130)
Total CHOL/HDL Ratio: 3.1 (calc) (ref <5.0)
Triglycerides: 97 mg/dL (ref <150)

## 2022-12-03 LAB — MAGNESIUM: Magnesium: 2.1 mg/dL (ref 1.5–2.5)

## 2022-12-03 LAB — TSH: TSH: 1.9 mIU/L (ref 0.40–4.50)

## 2022-12-10 ENCOUNTER — Telehealth: Payer: Self-pay

## 2022-12-10 NOTE — Telephone Encounter (Signed)
-----   Message from Antoine Poche, MD sent at 12/05/2022 12:38 PM EDT ----- Labs show mild to modeate kidney dysfunction that is stable. Cholesterol looks good. Blood sugars are not well controlled, needs to f/u with pcp. Her prior anemia has resolved.

## 2022-12-10 NOTE — Telephone Encounter (Signed)
Patient's sister ([name]- okay per DPR) notified and voiced understanding. Patients sister had no questions or concerns at this time.

## 2022-12-20 ENCOUNTER — Ambulatory Visit (HOSPITAL_COMMUNITY): Payer: 59

## 2023-01-12 ENCOUNTER — Encounter (HOSPITAL_COMMUNITY): Payer: Self-pay | Admitting: Emergency Medicine

## 2023-01-12 ENCOUNTER — Emergency Department (HOSPITAL_COMMUNITY)
Admission: EM | Admit: 2023-01-12 | Discharge: 2023-01-12 | Disposition: A | Discharge status: Home / Self Care | Payer: 59 | Attending: Emergency Medicine | Admitting: Emergency Medicine

## 2023-01-12 ENCOUNTER — Other Ambulatory Visit: Payer: Self-pay

## 2023-01-12 DIAGNOSIS — B029 Zoster without complications: Secondary | ICD-10-CM | POA: Diagnosis not present

## 2023-01-12 DIAGNOSIS — Z7982 Long term (current) use of aspirin: Secondary | ICD-10-CM | POA: Insufficient documentation

## 2023-01-12 DIAGNOSIS — Z794 Long term (current) use of insulin: Secondary | ICD-10-CM | POA: Diagnosis not present

## 2023-01-12 DIAGNOSIS — Z7901 Long term (current) use of anticoagulants: Secondary | ICD-10-CM | POA: Diagnosis not present

## 2023-01-12 DIAGNOSIS — H1033 Unspecified acute conjunctivitis, bilateral: Secondary | ICD-10-CM | POA: Diagnosis not present

## 2023-01-12 DIAGNOSIS — E119 Type 2 diabetes mellitus without complications: Secondary | ICD-10-CM | POA: Diagnosis not present

## 2023-01-12 DIAGNOSIS — R21 Rash and other nonspecific skin eruption: Secondary | ICD-10-CM | POA: Diagnosis present

## 2023-01-12 MED ORDER — PREDNISONE 20 MG PO TABS
40.0000 mg | ORAL_TABLET | Freq: Once | ORAL | Status: AC
  Administered 2023-01-12: 40 mg via ORAL
  Filled 2023-01-12: qty 2

## 2023-01-12 MED ORDER — VALACYCLOVIR HCL 1 G PO TABS: 1000.0000 mg | ORAL_TABLET | Freq: Three times a day (TID) | ORAL | 0 refills | Status: AC

## 2023-01-12 MED ORDER — VALACYCLOVIR HCL 500 MG PO TABS
1000.0000 mg | ORAL_TABLET | Freq: Every day | ORAL | Status: DC
  Administered 2023-01-12: 1000 mg via ORAL
  Filled 2023-01-12: qty 2

## 2023-01-12 MED ORDER — HYDROMORPHONE HCL 1 MG/ML IJ SOLN: 1.0000 mg | INTRAMUSCULAR | Status: DC | PRN

## 2023-01-12 MED ORDER — PREDNISONE 10 MG PO TABS: ORAL_TABLET | ORAL | 0 refills | Status: AC

## 2023-01-12 NOTE — ED Provider Notes (Signed)
[place] EMERGENCY DEPARTMENT AT [facility] Provider Note   CSN: [id number] Arrival date & time: 01/12/23  4098     History  Chief Complaint  Patient presents with   Rash   History obtained from patient and female at bedside.   Robin [name] is a 57 y.o. female with history of diabetes, stroke now with right-sided deficits presents, with concern for rash. This rash has been coming and going for the past month.  This most recent flareup has been present for 4 days.  The rash is located on the upper left chest above the breast and on the left shoulder blade.  The rash is painful but not itchy.  She denies any fever, chills, new detergents, recent illnesses, or contact with poison ivy.   Rash      Home Medications Prior to Admission medications   Medication Sig Start Date End Date Taking? Authorizing Provider  Accu-Chek Softclix Lancets lancets Use as instructed 06/13/21   Shamleffer, Konrad Dolores, MD  aspirin EC 81 MG tablet Take 1 tablet (81 mg total) by mouth daily. Swallow whole. 07/19/20   Strader, Lennart Pall, PA-C  Blood Glucose Monitoring Suppl (ACCU-CHEK GUIDE ME) w/Device KIT 1 Device by Does not apply route 3 (three) times daily. 03/09/21   Shamleffer, Konrad Dolores, MD  carvedilol (COREG) 25 MG tablet Take 2 tablets (50 mg total) by mouth 2 (two) times daily with a meal. 03/22/22   Branch, Dorothe Pea, MD  cetaphil (CETAPHIL) lotion Apply 1 Application topically as needed for dry skin. Patient not taking: Reported on 11/18/2022 10/11/22   Sabas Sous, MD  Continuous Blood Gluc Sensor (DEXCOM G6 SENSOR) MISC 1 Device by Does not apply route as directed. 05/01/22   Shamleffer, Konrad Dolores, MD  Continuous Blood Gluc Transmit (DEXCOM G6 TRANSMITTER) MISC 1 Device by Does not apply route as directed. 05/01/22   Shamleffer, Konrad Dolores, MD  Dulaglutide (TRULICITY) 3 MG/0.5ML SOPN Inject 3 mg as directed once a week. 10/30/22   Shamleffer, Konrad Dolores, MD   ELIQUIS 5 MG TABS tablet Take 1 tablet by mouth twice daily 11/29/22   Jarold Motto, PA  empagliflozin (JARDIANCE) 10 MG TABS tablet Take 1 tablet (10 mg total) by mouth daily before breakfast. 10/30/22   Shamleffer, Konrad Dolores, MD  ferrous sulfate 325 (65 FE) MG tablet Take 1 tablet (325 mg total) by mouth daily. 10/30/22   Jarold Motto, PA  furosemide (LASIX) 20 MG tablet TAKE 1 TABLET BY MOUTH ONCE DAILY (MAY TAKE ADDITIONAL 20 MG IF WEIGHT GAIN IS OVER 2LBS OVERNIGHT OR 5 LBS IN A WEEK) 08/12/22   Antoine Poche, MD  gabapentin (NEURONTIN) 100 MG capsule TAKE 2 CAPSULES BY MOUTH THREE TIMES DAILY 12/02/22   Jarold Motto, PA  glucose blood (ACCU-CHEK GUIDE) test strip USE 1 STRIP TO CHECK GLUCOSE THREE TIMES DAILY 08/28/22   Shamleffer, Konrad Dolores, MD  hydrALAZINE (APRESOLINE) 25 MG tablet TAKE 1 TABLET BY MOUTH THREE TIMES DAILY 12/02/22   Jarold Motto, PA  insulin aspart (NOVOLOG FLEXPEN) 100 UNIT/ML FlexPen Inject 12 Units into the skin 3 (three) times daily with meals. 10/30/22   Shamleffer, Konrad Dolores, MD  insulin glargine (LANTUS SOLOSTAR) 100 UNIT/ML Solostar Pen Inject 44 Units into the skin daily. 10/30/22   Shamleffer, Konrad Dolores, MD  insulin lispro (HUMALOG KWIKPEN) 100 UNIT/ML KwikPen Inject 12 Units into the skin 3 (three) times daily with meals 10/30/22   Shamleffer, Konrad Dolores, MD  Insulin Pen  Needle (BD PEN NEEDLE NANO U/F) 32G X 4 MM MISC Inject 1 Device into the skin in the morning, at noon, in the evening, and at bedtime. 10/30/22   Shamleffer, Konrad Dolores, MD  isosorbide mononitrate (IMDUR) 30 MG 24 hr tablet Take 2 tablets by mouth once daily 12/02/22   Jarold Motto, PA  pantoprazole (PROTONIX) 40 MG tablet Take 1 tablet by mouth once daily 12/02/22   Jarold Motto, PA  timolol (TIMOPTIC) 0.5 % ophthalmic solution Place 1 drop into both eyes 2 (two) times daily. 07/05/22   [provider]      Allergies    Bee venom,  Losartan, Naproxen, Penicillins, and Lisinopril    Review of Systems   Review of Systems  Skin:  Positive for rash.    Physical Exam Updated Vital Signs BP 118/85   Pulse 92   Temp 98.6 F (37 C) (Axillary)   Ht 5\' 7"  (1.702 m)   Wt 104.3 kg   LMP 08/21/2011   SpO2 99%   BMI 36.02 kg/m  Physical Exam Vitals and nursing note reviewed.  Eyes:     Comments: Subconjunctival hemorrhage bilaterally  Cardiovascular:     Rate and Rhythm: Normal rate and regular rhythm.     Heart sounds: No murmur heard. Pulmonary:     Effort: Pulmonary effort is normal.     Breath sounds: Normal breath sounds.  Skin:    General: Skin is warm and dry.     Comments: Singular as well as grouped vesicles on erythematous base located on left chest above the breast, left scapula, and left side of the neck.   Neurological:     Mental Status: She is alert.     ED Results / Procedures / Treatments   Labs (all labs ordered are listed, but only abnormal results are displayed) Labs Reviewed - No data to display  EKG None  Radiology No results found.  Procedures Procedures    Medications Ordered in ED Medications - No data to display  ED Course/ Medical Decision Making/ A&P                             Medical Decision Making  57 y.o. female presents to the ED for concern of rash for 4 days.  Differential diagnosis includes but is not limited to shingles, contact dermatitis, poison ivy.  Shingles most likely given vesicular nature of the rash in dermatomal distribution.  Patient denies any new soaps or lotions making contact dermatitis less likely.  Poison ivy less likely as patient does not know any recent exposure.  ED Course:  Discussed with patient this is likely shingles.  She is being discharged with valacyclovir and prednisone.  We discussed that she needs to monitor her blood sugars at home and take her blood sugar medications as the prednisone may increase her blood sugars.  She  is already on gabapentin at home which should help with the pain.    Additional history obtained from female at bedside with unknown relation to her.    The patient was discharged home with instructions to take the prescribed course of valacyclovir and prednisone.  She is already on gabapentin which should help with her pain.  She was instructed to watch her blood sugars as she is a diabetic.  She was given return precautions   Impression: Shingles          Final Clinical Impression(s) / ED Diagnoses  Final diagnoses:  None    Rx / DC Orders ED Discharge Orders     None         Arabella Merles, Cordelia Poche 01/12/23 1020    Eber Hong, MD 01/12/23 1213

## 2023-01-12 NOTE — Discharge Instructions (Addendum)
You have shingles, which is causing your rash. This rash is contagious. Do not allow anyone to touch your skin while you still have the rash.   You have been given a prescription for Prednisone. Take this medication as directed. This medication may cause your blood sugar to increase. Make sure you are monitoring your blood sugars and taking your blood sugar medications. Take this medication in the morning.   You have been given a prescription for Valacyclovir. Take this medication as directed and finish the full course.    Return to the ER should you have any shortness of breath, chest pain, or your blood sugars cannot be controlled, or you have any other symptoms that are concerning to you

## 2023-01-12 NOTE — ED Triage Notes (Signed)
Pt c/o of rash x4 days to left upper chest and upper back.

## 2023-01-26 ENCOUNTER — Emergency Department (HOSPITAL_COMMUNITY): Payer: 59

## 2023-01-26 ENCOUNTER — Encounter (HOSPITAL_COMMUNITY): Payer: Self-pay | Admitting: *Deleted

## 2023-01-26 ENCOUNTER — Other Ambulatory Visit: Payer: Self-pay

## 2023-01-26 ENCOUNTER — Emergency Department (HOSPITAL_COMMUNITY)
Admission: EM | Admit: 2023-01-26 | Discharge: 2023-01-26 | Disposition: A | Discharge status: Home / Self Care | Payer: 59 | Attending: Emergency Medicine | Admitting: Emergency Medicine

## 2023-01-26 DIAGNOSIS — R739 Hyperglycemia, unspecified: Secondary | ICD-10-CM | POA: Diagnosis not present

## 2023-01-26 DIAGNOSIS — Z794 Long term (current) use of insulin: Secondary | ICD-10-CM | POA: Insufficient documentation

## 2023-01-26 DIAGNOSIS — Z7982 Long term (current) use of aspirin: Secondary | ICD-10-CM | POA: Insufficient documentation

## 2023-01-26 DIAGNOSIS — R1032 Left lower quadrant pain: Secondary | ICD-10-CM | POA: Insufficient documentation

## 2023-01-26 DIAGNOSIS — Z7901 Long term (current) use of anticoagulants: Secondary | ICD-10-CM | POA: Diagnosis not present

## 2023-01-26 DIAGNOSIS — I5020 Unspecified systolic (congestive) heart failure: Secondary | ICD-10-CM | POA: Insufficient documentation

## 2023-01-26 DIAGNOSIS — M545 Low back pain, unspecified: Secondary | ICD-10-CM | POA: Insufficient documentation

## 2023-01-26 DIAGNOSIS — Z8673 Personal history of transient ischemic attack (TIA), and cerebral infarction without residual deficits: Secondary | ICD-10-CM | POA: Diagnosis not present

## 2023-01-26 LAB — URINALYSIS, ROUTINE W REFLEX MICROSCOPIC
Bacteria, UA: NONE SEEN
Bilirubin Urine: NEGATIVE
Glucose, UA: >=500 mg/dL — AB
Hgb urine dipstick: NEGATIVE
Ketones, ur: NEGATIVE mg/dL
Leukocytes,Ua: NEGATIVE
Nitrite: NEGATIVE
Protein, ur: NEGATIVE mg/dL
Specific Gravity, Urine: 1.015 (ref 1.005–1.030)
pH: 5 (ref 5.0–8.0)

## 2023-01-26 LAB — CBC WITH DIFFERENTIAL/PLATELET
Abs Immature Granulocytes: 0.02 10*3/uL (ref 0.00–0.07)
Basophils Absolute: 0.1 10*3/uL (ref 0.0–0.1)
Basophils Relative: 1 %
Eosinophils Absolute: 0.2 10*3/uL (ref 0.0–0.5)
Eosinophils Relative: 2 %
HCT: 42.1 % (ref 36.0–46.0)
Hemoglobin: 13.2 g/dL (ref 12.0–15.0)
Immature Granulocytes: 0 %
Lymphocytes Relative: 36 %
Lymphs Abs: 2.3 10*3/uL (ref 0.7–4.0)
MCH: 25.5 pg — ABNORMAL LOW (ref 26.0–34.0)
MCHC: 31.4 g/dL (ref 30.0–36.0)
MCV: 81.3 fL (ref 80.0–100.0)
Monocytes Absolute: 0.6 10*3/uL (ref 0.1–1.0)
Monocytes Relative: 9 %
Neutro Abs: 3.4 10*3/uL (ref 1.7–7.7)
Neutrophils Relative %: 52 %
Platelets: 277 10*3/uL (ref 150–400)
RBC: 5.18 MIL/uL — ABNORMAL HIGH (ref 3.87–5.11)
RDW: 16.9 % — ABNORMAL HIGH (ref 11.5–15.5)
WBC: 6.5 10*3/uL (ref 4.0–10.5)
nRBC: 0 % (ref 0.0–0.2)

## 2023-01-26 LAB — COMPREHENSIVE METABOLIC PANEL
ALT: 13 U/L (ref 0–44)
AST: 14 U/L — ABNORMAL LOW (ref 15–41)
Albumin: 3.4 g/dL — ABNORMAL LOW (ref 3.5–5.0)
Alkaline Phosphatase: 85 U/L (ref 38–126)
Anion gap: 11 (ref 5–15)
BUN: 29 mg/dL — ABNORMAL HIGH (ref 6–20)
CO2: 23 mmol/L (ref 22–32)
Calcium: 9 mg/dL (ref 8.9–10.3)
Chloride: 103 mmol/L (ref 98–111)
Creatinine, Ser: 1.86 mg/dL — ABNORMAL HIGH (ref 0.44–1.00)
GFR, Estimated: 31 mL/min — ABNORMAL LOW (ref >60)
Glucose, Bld: 236 mg/dL — ABNORMAL HIGH (ref 70–99)
Potassium: 4 mmol/L (ref 3.5–5.1)
Sodium: 137 mmol/L (ref 135–145)
Total Bilirubin: 0.4 mg/dL (ref 0.3–1.2)
Total Protein: 7 g/dL (ref 6.5–8.1)

## 2023-01-26 LAB — LIPASE, BLOOD: Lipase: 46 U/L (ref 11–51)

## 2023-01-26 MED ORDER — DICYCLOMINE HCL 20 MG PO TABS: 20.0000 mg | ORAL_TABLET | Freq: Two times a day (BID) | ORAL | 0 refills | Status: DC

## 2023-01-26 MED ORDER — MORPHINE SULFATE (PF) 4 MG/ML IV SOLN
4.0000 mg | Freq: Once | INTRAVENOUS | Status: AC
  Administered 2023-01-26: 4 mg via INTRAVENOUS
  Filled 2023-01-26: qty 1

## 2023-01-26 MED ORDER — DICYCLOMINE HCL 10 MG PO CAPS
10.0000 mg | ORAL_CAPSULE | Freq: Once | ORAL | Status: AC
  Administered 2023-01-26: 10 mg via ORAL
  Filled 2023-01-26: qty 1

## 2023-01-26 NOTE — ED Provider Notes (Signed)
[place] EMERGENCY DEPARTMENT AT [facility] Provider Note   CSN: [id number] Arrival date & time: 01/26/23  1224     History  Chief Complaint  Patient presents with   Abdominal Pain    Robin [name] is a 57 y.o. female.  History of stroke with right-sided deficits and expressive aphasia, HFrEF, abdominal aortic aneurysm, and A-fib.  She Is ER today complaining of 3 days of lower abdominal pain on the left side, left lower back pain.  History somewhat difficult due to her expressive aphasia but she is able to write some of her answers now and answer yes and no without difficulty.  She states the pain is intermittent throbbing constant lasting a couple of hours at a time.  She has no nausea or vomiting, no diarrhea or constipation, no dysuria, no hematuria.  She has never had this happen in the past.  Abdominal Pain      Home Medications Prior to Admission medications   Medication Sig Start Date End Date Taking? Authorizing Provider  dicyclomine (BENTYL) 20 MG tablet Take 1 tablet (20 mg total) by mouth 2 (two) times daily. 01/26/23  Yes Layman Gully A, PA-C  Accu-Chek Softclix Lancets lancets Use as instructed 06/13/21   Shamleffer, Konrad Dolores, MD  aspirin EC 81 MG tablet Take 1 tablet (81 mg total) by mouth daily. Swallow whole. 07/19/20   Strader, Lennart Pall, PA-C  Blood Glucose Monitoring Suppl (ACCU-CHEK GUIDE ME) w/Device KIT 1 Device by Does not apply route 3 (three) times daily. 03/09/21   Shamleffer, Konrad Dolores, MD  carvedilol (COREG) 25 MG tablet Take 2 tablets (50 mg total) by mouth 2 (two) times daily with a meal. 03/22/22   Branch, Dorothe Pea, MD  cetaphil (CETAPHIL) lotion Apply 1 Application topically as needed for dry skin. Patient not taking: Reported on 11/18/2022 10/11/22   Sabas Sous, MD  Continuous Blood Gluc Sensor (DEXCOM G6 SENSOR) MISC 1 Device by Does not apply route as directed. 05/01/22   Shamleffer, Konrad Dolores, MD  Continuous  Blood Gluc Transmit (DEXCOM G6 TRANSMITTER) MISC 1 Device by Does not apply route as directed. 05/01/22   Shamleffer, Konrad Dolores, MD  Dulaglutide (TRULICITY) 3 MG/0.5ML SOPN Inject 3 mg as directed once a week. 10/30/22   Shamleffer, Konrad Dolores, MD  ELIQUIS 5 MG TABS tablet Take 1 tablet by mouth twice daily 11/29/22   Jarold Motto, PA  empagliflozin (JARDIANCE) 10 MG TABS tablet Take 1 tablet (10 mg total) by mouth daily before breakfast. 10/30/22   Shamleffer, Konrad Dolores, MD  ferrous sulfate 325 (65 FE) MG tablet Take 1 tablet (325 mg total) by mouth daily. 10/30/22   Jarold Motto, PA  furosemide (LASIX) 20 MG tablet TAKE 1 TABLET BY MOUTH ONCE DAILY (MAY TAKE ADDITIONAL 20 MG IF WEIGHT GAIN IS OVER 2LBS OVERNIGHT OR 5 LBS IN A WEEK) 08/12/22   Antoine Poche, MD  gabapentin (NEURONTIN) 100 MG capsule TAKE 2 CAPSULES BY MOUTH THREE TIMES DAILY 12/02/22   Jarold Motto, PA  glucose blood (ACCU-CHEK GUIDE) test strip USE 1 STRIP TO CHECK GLUCOSE THREE TIMES DAILY 08/28/22   Shamleffer, Konrad Dolores, MD  hydrALAZINE (APRESOLINE) 25 MG tablet TAKE 1 TABLET BY MOUTH THREE TIMES DAILY 12/02/22   Jarold Motto, PA  insulin aspart (NOVOLOG FLEXPEN) 100 UNIT/ML FlexPen Inject 12 Units into the skin 3 (three) times daily with meals. 10/30/22   Shamleffer, Konrad Dolores, MD  insulin glargine (LANTUS SOLOSTAR) 100 UNIT/ML Solostar  Pen Inject 44 Units into the skin daily. 10/30/22   Shamleffer, Konrad Dolores, MD  insulin lispro (HUMALOG KWIKPEN) 100 UNIT/ML KwikPen Inject 12 Units into the skin 3 (three) times daily with meals 10/30/22   Shamleffer, Konrad Dolores, MD  Insulin Pen Needle (BD PEN NEEDLE NANO U/F) 32G X 4 MM MISC Inject 1 Device into the skin in the morning, at noon, in the evening, and at bedtime. 10/30/22   Shamleffer, Konrad Dolores, MD  isosorbide mononitrate (IMDUR) 30 MG 24 hr tablet Take 2 tablets by mouth once daily 12/02/22   Jarold Motto, PA  pantoprazole  (PROTONIX) 40 MG tablet Take 1 tablet by mouth once daily 12/02/22   Jarold Motto, PA  timolol (TIMOPTIC) 0.5 % ophthalmic solution Place 1 drop into both eyes 2 (two) times daily. 07/05/22   [provider]      Allergies    Bee venom, Losartan, Naproxen, Penicillins, and Lisinopril    Review of Systems   Review of Systems  Gastrointestinal:  Positive for abdominal pain.    Physical Exam Updated Vital Signs BP 110/74 (BP Location: Right Arm)   Pulse 72   Temp 98.1 F (36.7 C) (Oral)   Resp 16   Ht 5\' 7"  (1.702 m)   Wt 104.8 kg   LMP 08/21/2011   SpO2 99%   BMI 36.18 kg/m  Physical Exam Vitals and nursing note reviewed.  Constitutional:      General: She is not in acute distress.    Appearance: She is well-developed.  HENT:     Head: Normocephalic and atraumatic.  Eyes:     Conjunctiva/sclera: Conjunctivae normal.  Cardiovascular:     Rate and Rhythm: Normal rate and regular rhythm.     Heart sounds: No murmur heard. Pulmonary:     Effort: Pulmonary effort is normal. No respiratory distress.     Breath sounds: Normal breath sounds.  Abdominal:     Palpations: Abdomen is soft.     Tenderness: There is abdominal tenderness in the left lower quadrant. There is no right CVA tenderness, left CVA tenderness, guarding or rebound.  Musculoskeletal:        General: No swelling.     Cervical back: Neck supple.  Skin:    General: Skin is warm and dry.     Capillary Refill: Capillary refill takes less than 2 seconds.  Neurological:     General: No focal deficit present.     Mental Status: She is alert.  Psychiatric:        Mood and Affect: Mood normal.     ED Results / Procedures / Treatments   Labs (all labs ordered are listed, but only abnormal results are displayed) Labs Reviewed  CBC WITH DIFFERENTIAL/PLATELET - Abnormal; Notable for the following components:      Result Value   RBC 5.18 (*)    MCH 25.5 (*)    RDW 16.9 (*)    All other components  within normal limits  COMPREHENSIVE METABOLIC PANEL - Abnormal; Notable for the following components:   Glucose, Bld 236 (*)    BUN 29 (*)    Creatinine, Ser 1.86 (*)    Albumin 3.4 (*)    AST 14 (*)    GFR, Estimated 31 (*)    All other components within normal limits  URINALYSIS, ROUTINE W REFLEX MICROSCOPIC - Abnormal; Notable for the following components:   Color, Urine STRAW (*)    Glucose, UA >=500 (*)    All  other components within normal limits  LIPASE, BLOOD    EKG None  Radiology MR PELVIS WO CONTRAST  Result Date: 01/26/2023 CLINICAL DATA:  Left lower quadrant and lower left back pain EXAM: MRI ABDOMEN AND PELVIS WITHOUT CONTRAST TECHNIQUE: Multiplanar multisequence MR imaging of the abdomen and pelvis was performed. No intravenous contrast was administered. COMPARISON:  CT AP 08/14/2020 FINDINGS: COMBINED FINDINGS FOR BOTH MR ABDOMEN AND PELVIS Lower chest: Trace bilateral pleural effusions and small pericardial effusion identified, image 1/6. Hepatobiliary: No focal liver abnormality. Previous cholecystectomy. Increase caliber of the common bile duct measures 9 mm. Very mild intrahepatic bile duct dilatation. No signs of choledocholithiasis. Pancreas: There is mild increased T2 signal within the head of pancreas with soft tissue stranding noted no main duct dilatation or mass identified. Spleen:  Within normal limits in size and appearance. Adrenals/Urinary Tract: Normal adrenal glands. No kidney mass or hydronephrosis identified. The urinary bladder is unremarkable. Stomach/Bowel: Stomach appears normal. No pathologic dilatation of the large or small bowel loops. The appendix is not confidently identified separate from the right lower quadrant bowel loops. No right lower quadrant inflammatory changes identified to suggest inflammation from acute pancreatitis. No pathologic dilatation of the large or small bowel loops. Vascular/Lymphatic: Normal caliber of the abdominal aorta. No  signs of abdominopelvic adenopathy. Reproductive: Status post hysterectomy. No adnexal masses. Other:  No free fluid or fluid collections. Musculoskeletal: No suspicious bone lesions identified. IMPRESSION: 1. Mild increased T2 signal within the head of pancreas with soft tissue stranding noted. No main duct dilatation or mass identified. Correlate for any clinical signs or symptoms of acute pancreatitis. 2. The appendix is not confidently identified separate from the right lower quadrant bowel loops. No right lower quadrant inflammatory changes identified to suggest inflammation from acute pancreatitis. 3. Previous cholecystectomy. Increase caliber of the common bile duct measures 9 mm. No signs of choledocholithiasis. In the absence of signs/symptoms of biliary obstruction these findings most likely reflect post cholecystectomy physiology. 4. Trace bilateral pleural effusions and small pericardial effusion. Electronically Signed   By: Signa Kell M.D.   On: 01/26/2023 15:16   MR ABDOMEN WO CONTRAST  Result Date: 01/26/2023 CLINICAL DATA:  Left lower quadrant and lower left back pain EXAM: MRI ABDOMEN AND PELVIS WITHOUT CONTRAST TECHNIQUE: Multiplanar multisequence MR imaging of the abdomen and pelvis was performed. No intravenous contrast was administered. COMPARISON:  CT AP 08/14/2020 FINDINGS: COMBINED FINDINGS FOR BOTH MR ABDOMEN AND PELVIS Lower chest: Trace bilateral pleural effusions and small pericardial effusion identified, image 1/6. Hepatobiliary: No focal liver abnormality. Previous cholecystectomy. Increase caliber of the common bile duct measures 9 mm. Very mild intrahepatic bile duct dilatation. No signs of choledocholithiasis. Pancreas: There is mild increased T2 signal within the head of pancreas with soft tissue stranding noted no main duct dilatation or mass identified. Spleen:  Within normal limits in size and appearance. Adrenals/Urinary Tract: Normal adrenal glands. No kidney mass or  hydronephrosis identified. The urinary bladder is unremarkable. Stomach/Bowel: Stomach appears normal. No pathologic dilatation of the large or small bowel loops. The appendix is not confidently identified separate from the right lower quadrant bowel loops. No right lower quadrant inflammatory changes identified to suggest inflammation from acute pancreatitis. No pathologic dilatation of the large or small bowel loops. Vascular/Lymphatic: Normal caliber of the abdominal aorta. No signs of abdominopelvic adenopathy. Reproductive: Status post hysterectomy. No adnexal masses. Other:  No free fluid or fluid collections. Musculoskeletal: No suspicious bone lesions identified. IMPRESSION: 1. Mild increased  T2 signal within the head of pancreas with soft tissue stranding noted. No main duct dilatation or mass identified. Correlate for any clinical signs or symptoms of acute pancreatitis. 2. The appendix is not confidently identified separate from the right lower quadrant bowel loops. No right lower quadrant inflammatory changes identified to suggest inflammation from acute pancreatitis. 3. Previous cholecystectomy. Increase caliber of the common bile duct measures 9 mm. No signs of choledocholithiasis. In the absence of signs/symptoms of biliary obstruction these findings most likely reflect post cholecystectomy physiology. 4. Trace bilateral pleural effusions and small pericardial effusion. Electronically Signed   By: Signa Kell M.D.   On: 01/26/2023 15:16    Procedures Procedures    Medications Ordered in ED Medications  morphine (PF) 4 MG/ML injection 4 mg (4 mg Intravenous Given 01/26/23 1350)  dicyclomine (BENTYL) capsule 10 mg (10 mg Oral Given 01/26/23 1558)    ED Course/ Medical Decision Making/ A&P Clinical Course as of 01/26/23 2246  Sun Jan 26, 2023  1547 Comprehensive metabolic panel(!) [CB]    Clinical Course User Index [CB] Ma Rings, PA-C                             Medical  Decision Making This patient presents to the ED for concern of lower quadrant abdominal pain x 3 days, this involves an extensive number of treatment options, and is a complaint that carries with it a high risk of complications and morbidity.  The differential diagnosis includes radiculitis, nephrolithiasis, ovarian torsion, intra-abdominal abscess, other   Co morbidities that complicate the patient evaluation : Diabetes, CVA with residual right-sided deficits and expressive aphasia, AAA, atrial fibrillation   Additional history obtained:  Additional history obtained from EMR External records from outside source obtained and reviewed including notes   Lab Tests:  I Ordered, and personally interpreted labs.  The pertinent results include: CBC shows no leukocytosis, no anemia, CMP shows baseline renal function, normal LFTs, glucose slightly elevated at 236, urinalysis shows glucosuria but otherwise normal   Imaging Studies ordered:  MRI abdomen had to be performed in place of CT abdomen pelvis due to lack of CT availability today in the ED.  There is some increased signal arising the pancreas but otherwise no acute findings.  Patient had does not have epigastric tenderness or upper back pain and lipase is normal.  Discussed the findings with the patient     Problem List / ED Course / Critical interventions / Medication management  Patient presents the ER with left lower quadrant and low back pain for the past several days.  No history of similar.  No diarrhea constipation, no nausea or vomiting.  Given her tenderness imaging ordered and shows no acute findings outside of some possible inflammation around the pancreas which does not correlate clinically with patient's symptoms or with her laboratory findings.  She is asking to eat and drink now is well-appearing with reassuring vitals, advised to have follow-up with GI and her PCP and come back for new or worsening symptoms. I ordered  medication including morphine for pain Reevaluation of the patient after these medicines showed that the patient improved I have reviewed the patients home medicines and have made adjustments as needed    Amount and/or Complexity of Data Reviewed Labs: ordered. Decision-making details documented in ED Course. Radiology: ordered.  Risk Prescription drug management.           Final Clinical Impression(s) /  ED Diagnoses Final diagnoses:  Left lower quadrant abdominal pain    Rx / DC Orders ED Discharge Orders          Ordered    dicyclomine (BENTYL) 20 MG tablet  2 times daily        01/26/23 [address] 01/26/23 2246    Gerhard Munch, MD 01/27/23 [phone]

## 2023-01-26 NOTE — Discharge Instructions (Signed)
To take care of you today.  You are seen for abdominal pain.  Your blood work is reassuring, there is no signs of UTI, no blood in your urine to suggest a kidney stone.  We did an MRI today because CT scan was not working and did not find any acute abnormalities.  There is some mild inflammation around your pancreas, however, your blood work for your pancreas was normal.  This is also not the area of your pain.  Is important to follow-up with your primary care doctor and GI doctor.  If you get worsening pain, vomiting, fever or any other new or worsening symptoms come back to the ER.

## 2023-01-26 NOTE — ED Triage Notes (Signed)
Pt with LLQ and lower left back pain since Thursday.  Denies any N/V/D.

## 2023-01-28 ENCOUNTER — Other Ambulatory Visit: Payer: Self-pay | Admitting: Physician Assistant

## 2023-01-29 NOTE — Progress Notes (Signed)
Robin [name] is a 57 y.o. female here for a hospital/ED follow-up.  History of Present Illness:   Chief Complaint  Patient presents with   Follow-up    Pt here for f/u from ED visit on 6/23 abdominal pain.   Vaginal Pain    Pt c/o vaginal pain off and on.    HPI  Abdominal Pain Seen in ED on 01/26/23 for LLQ abdominal pain for 3 days. MRI abdomen showed increased signal arising the pancreas but otherwise no acute findings. Given morhpine, Bentyl. Discharged with Bentyl. Her pain is improved with Bentyl. Would like refill.  Vaginal pain Reports vaginal pain History is difficult to obtain due to aphasia Occasionally getting a sharp, shooting sensation in her pubic region Denies: vaginal bleeding, discharge, fever, chills, poor appetite, recent sexual activity, urinary symptom(s)   Expressive aphasia Would like to get into regular speech therapy for this issue   Past Medical History:  Diagnosis Date   A-fib (HCC)    Abdominal aortic aneurysm (AAA) (HCC)    Allergy    Anemia    Arthritis    Atrial fibrillation (HCC)    CHF (congestive heart failure) (HCC)    a. EF at 30-35% by echo in 08/2018 b. EF at 35% by repeat echo in 05/2019 c. EF improved to 65-70% in 2021 d. EF at 40-45% in 08/2021.   Chronic abdominal pain    Chronic atrial fibrillation (HCC)    Cocaine abuse (HCC)    COPD (chronic obstructive pulmonary disease) (HCC)    Diabetes mellitus without complication (HCC)    Essential hypertension, benign    Expressive aphasia    Expressive aphasia    post CVA   Fatty liver    GERD (gastroesophageal reflux disease)    Gout 2016   Normal coronary arteries    3/10 - following abnormal Myoview   Ovarian cyst    Stroke (HCC) 12/26/2019   Right sided weakness, and expressive aphasia   Stroke [facility])    Thoracic ascending aortic aneurysm (HCC)    4 cm 10/31/19 CTA   Type 2 diabetes mellitus (HCC)    type II     Social History   Tobacco Use   Smoking status:  Former    Years: 29    Types: Cigarettes    Quit date: 12/2019    Years since quitting: 3.1   Smokeless tobacco: Never  Vaping Use   Vaping Use: Never used  Substance Use Topics   Alcohol use: Not Currently    Comment: occ   Drug use: Not Currently    Past Surgical History:  Procedure Laterality Date   ABDOMINAL HYSTERECTOMY  09/10/2011   Procedure: HYSTERECTOMY ABDOMINAL;  Surgeon: Tilda Burrow, MD;  Location: [facility];  Service: Gynecology;  Laterality: N/A;  Abdominal hysterectomy   CESAREAN SECTION  [id number], and 1994   CHOLECYSTECTOMY  1995   IR 3D INDEPENDENT WKST  03/16/2020   IR ANGIO INTRA EXTRACRAN SEL INTERNAL CAROTID BILAT MOD SED  03/16/2020   IR ANGIO VERTEBRAL SEL SUBCLAVIAN INNOMINATE UNI L MOD SED  03/16/2020   IR ANGIO VERTEBRAL SEL VERTEBRAL UNI R MOD SED  03/16/2020   IR CT HEAD LTD  12/27/2019   IR PERCUTANEOUS ART THROMBECTOMY/INFUSION INTRACRANIAL INC DIAG ANGIO  12/27/2019   IR US GUIDE VASC ACCESS RIGHT  12/27/2019   IR US GUIDE VASC ACCESS RIGHT  03/16/2020   RADIOLOGY WITH ANESTHESIA N/A 12/27/2019   Procedure: IR WITH ANESTHESIA;  Surgeon:  Julieanne Cotton, MD;  Location: [facility];  Service: Radiology;  Laterality: N/A;   SCAR REVISION  09/10/2011   Procedure: SCAR REVISION;  Surgeon: Tilda Burrow, MD;  Location: [facility];  Service: Gynecology;  Laterality: N/A;  Wide Excision of old Cicatrix   TUBAL LIGATION  1994    Family History  Problem Relation Age of Onset   Cirrhosis Mother    Early death Mother    Alcohol abuse Mother    Diabetes type II Father    Alcohol abuse Father    Diabetes type II Sister    Early death Brother    Alcohol abuse Son    Anesthesia problems Neg Hx    Hypotension Neg Hx    Malignant hyperthermia Neg Hx    Pseudochol deficiency Neg Hx    Colon cancer Neg Hx    Colon polyps Neg Hx    Esophageal cancer Neg Hx    Rectal cancer Neg Hx    Stomach cancer Neg Hx     Allergies  Allergen Reactions   Bee Venom Shortness Of  Breath and Swelling    Bodily Swelling   Losartan Other (See Comments)    Nosebleeds per patient report.    Naproxen Other (See Comments)    Acid reflux   Penicillins Nausea Only    Has patient had a PCN reaction causing immediate rash, facial/tongue/throat swelling, SOB or lightheadedness with hypotension: no Has patient had a PCN reaction causing severe rash involving mucus membranes or skin necrosis: no Has patient had a PCN reaction that required hospitalization no Has patient had a PCN reaction occurring within the last 10 years: no If all of the above answers are "NO", then may proceed with Cephalosporin    Lisinopril     Sinus congestion    Current Medications:   Current Outpatient Medications:    Accu-Chek Softclix Lancets lancets, Use as instructed, Disp: 100 each, Rfl: 12   aspirin EC 81 MG tablet, Take 1 tablet (81 mg total) by mouth daily. Swallow whole., Disp: 90 tablet, Rfl: 3   Blood Glucose Monitoring Suppl (ACCU-CHEK GUIDE ME) w/Device KIT, 1 Device by Does not apply route 3 (three) times daily., Disp: 1 kit, Rfl: 0   carvedilol (COREG) 25 MG tablet, Take 2 tablets (50 mg total) by mouth 2 (two) times daily with a meal., Disp: 360 tablet, Rfl: 3   cetaphil (CETAPHIL) lotion, Apply 1 Application topically as needed for dry skin., Disp: 236 mL, Rfl: 0   Continuous Blood Gluc Sensor (DEXCOM G6 SENSOR) MISC, 1 Device by Does not apply route as directed., Disp: 9 each, Rfl: 3   Continuous Blood Gluc Transmit (DEXCOM G6 TRANSMITTER) MISC, 1 Device by Does not apply route as directed., Disp: 1 each, Rfl: 3   Dulaglutide (TRULICITY) 1.5 MG/0.5ML SOPN, Inject 1.5 mg into the skin once a week., Disp: 6 mL, Rfl: 0   ELIQUIS 5 MG TABS tablet, Take 1 tablet by mouth twice daily, Disp: 180 tablet, Rfl: 0   empagliflozin (JARDIANCE) 10 MG TABS tablet, Take 1 tablet (10 mg total) by mouth daily before breakfast., Disp: 90 tablet, Rfl: 3   Ferrous Sulfate (IRON) 325 (65 Fe) MG TABS, Take  1 tablet by mouth once daily, Disp: 90 tablet, Rfl: 0   furosemide (LASIX) 20 MG tablet, TAKE 1 TABLET BY MOUTH ONCE DAILY (MAY TAKE ADDITIONAL 20 MG IF WEIGHT GAIN IS OVER 2LBS OVERNIGHT OR 5 LBS IN A WEEK), Disp: 105 tablet, Rfl:  3   gabapentin (NEURONTIN) 100 MG capsule, TAKE 2 CAPSULES BY MOUTH THREE TIMES DAILY, Disp: 180 capsule, Rfl: 2   glucose blood (ACCU-CHEK GUIDE) test strip, USE 1 STRIP TO CHECK GLUCOSE THREE TIMES DAILY, Disp: 300 each, Rfl: 3   hydrALAZINE (APRESOLINE) 25 MG tablet, TAKE 1 TABLET BY MOUTH THREE TIMES DAILY, Disp: 270 tablet, Rfl: 1   insulin aspart (NOVOLOG FLEXPEN) 100 UNIT/ML FlexPen, Inject 12 Units into the skin 3 (three) times daily with meals., Disp: 45 mL, Rfl: 3   insulin glargine (LANTUS SOLOSTAR) 100 UNIT/ML Solostar Pen, Inject 44 Units into the skin daily., Disp: 45 mL, Rfl: 3   insulin lispro (HUMALOG KWIKPEN) 100 UNIT/ML KwikPen, Inject 12 Units into the skin 3 (three) times daily with meals, Disp: 45 mL, Rfl: 3   Insulin Pen Needle (BD PEN NEEDLE NANO U/F) 32G X 4 MM MISC, Inject 1 Device into the skin in the morning, at noon, in the evening, and at bedtime., Disp: 400 each, Rfl: 3   isosorbide mononitrate (IMDUR) 30 MG 24 hr tablet, Take 2 tablets by mouth once daily, Disp: 180 tablet, Rfl: 1   pantoprazole (PROTONIX) 40 MG tablet, Take 1 tablet by mouth once daily, Disp: 90 tablet, Rfl: 1   timolol (TIMOPTIC) 0.5 % ophthalmic solution, Place 1 drop into both eyes 2 (two) times daily., Disp: , Rfl:    dicyclomine (BENTYL) 20 MG tablet, Take 1 tablet (20 mg total) by mouth 2 (two) times daily as needed for spasms., Disp: 20 tablet, Rfl: 0   Review of Systems:   ROS Negative unless otherwise specified per HPI.  Vitals:   Vitals:   02/05/23 1103  BP: 138/78  Pulse: 73  Temp: 97.8 F (36.6 C)  TempSrc: Temporal  SpO2: 99%  Weight: 235 lb 6.1 oz (106.8 kg)  Height: 5\' 7"  (1.702 m)     Body mass index is 36.87 kg/m.  Physical Exam:    Physical Exam Exam conducted with a chaperone present.  Constitutional:      Appearance: Normal appearance. She is well-developed.  HENT:     Head: Normocephalic and atraumatic.  Eyes:     General: Lids are normal.     Extraocular Movements: Extraocular movements intact.     Conjunctiva/sclera: Conjunctivae normal.  Pulmonary:     Effort: Pulmonary effort is normal.  Abdominal:     General: Abdomen is flat. Bowel sounds are normal.     Palpations: Abdomen is soft.     Tenderness: There is abdominal tenderness in the suprapubic area.  Genitourinary:    Labia:        Right: No rash or tenderness.        Left: No rash or tenderness.      Vagina: Normal. No signs of injury.  Musculoskeletal:        General: Normal range of motion.     Cervical back: Normal range of motion and neck supple.  Skin:    General: Skin is warm and dry.  Neurological:     Mental Status: She is alert and oriented to [place], place, and time.  Psychiatric:        Attention and Perception: Attention and perception normal.        Mood and Affect: Mood normal.        Behavior: Behavior normal.        Thought Content: Thought content normal.        Judgment: Judgment normal.  Assessment and Plan:   Vaginal pain Unclear etiology I obtained vaginal swab  Most recent MR pelvis wnl Will treat based on results Consider gynecology referral if symptom(s) persist She was unable to provide urine sample today  Expressive aphasia Referral to ST  Abdominal pain, unspecified abdominal location Improved She is requesting refill on bentyl Follow-up as needed     I,Alexander Ruley,acting as a scribe for [employer], PA.,have documented all relevant documentation on the behalf of Jarold Motto, PA,as directed by  Jarold Motto, PA while in the presence of Jarold Motto, [place].   I, Jarold Motto, [place], have reviewed all documentation for this visit. The documentation on 02/05/23 for the exam,  diagnosis, procedures, and orders are all accurate and complete.    Jarold Motto, PA-C

## 2023-02-03 ENCOUNTER — Ambulatory Visit (HOSPITAL_COMMUNITY)
Admission: RE | Admit: 2023-02-03 | Discharge: 2023-02-03 | Disposition: A | Discharge status: Home / Self Care | Payer: 59 | Source: Ambulatory Visit | Attending: Cardiology | Admitting: Cardiology

## 2023-02-03 DIAGNOSIS — I5022 Chronic systolic (congestive) heart failure: Secondary | ICD-10-CM | POA: Insufficient documentation

## 2023-02-03 LAB — ECHOCARDIOGRAM COMPLETE
AR max vel: 1.05 cm2
AV Area VTI: 1.05 cm2
AV Area mean vel: 1.1 cm2
AV Mean grad: 14 mmHg
AV Peak grad: 21.9 mmHg
Ao pk vel: 2.34 m/s
Area-P 1/2: 5.4 cm2
S' Lateral: 3.6 cm
Single Plane A4C EF: 47.4 %

## 2023-02-03 MED ORDER — PERFLUTREN LIPID MICROSPHERE
1.0000 mL | INTRAVENOUS | Status: AC | PRN
  Administered 2023-02-03: 3 mL via INTRAVENOUS

## 2023-02-03 NOTE — Progress Notes (Signed)
*  PRELIMINARY RESULTS* Echocardiogram 2D Echocardiogram has been performed with Definity.  Stacey Drain 02/03/2023, 3:40 PM

## 2023-02-05 ENCOUNTER — Ambulatory Visit (INDEPENDENT_AMBULATORY_CARE_PROVIDER_SITE_OTHER): Payer: 59 | Admitting: Physician Assistant

## 2023-02-05 ENCOUNTER — Other Ambulatory Visit (HOSPITAL_COMMUNITY)
Admission: RE | Admit: 2023-02-05 | Discharge: 2023-02-05 | Disposition: A | Discharge status: Home / Self Care | Payer: 59 | Source: Ambulatory Visit | Attending: Physician Assistant | Admitting: Physician Assistant

## 2023-02-05 ENCOUNTER — Other Ambulatory Visit (HOSPITAL_COMMUNITY): Payer: Self-pay

## 2023-02-05 ENCOUNTER — Encounter: Payer: Self-pay | Admitting: Internal Medicine

## 2023-02-05 ENCOUNTER — Encounter: Payer: Self-pay | Admitting: Physician Assistant

## 2023-02-05 VITALS — BP 138/78 | HR 73 | Temp 97.8°F | Ht 67.0 in | Wt 235.4 lb

## 2023-02-05 DIAGNOSIS — R109 Unspecified abdominal pain: Secondary | ICD-10-CM | POA: Diagnosis not present

## 2023-02-05 DIAGNOSIS — R4701 Aphasia: Secondary | ICD-10-CM

## 2023-02-05 DIAGNOSIS — R102 Pelvic and perineal pain: Secondary | ICD-10-CM | POA: Insufficient documentation

## 2023-02-05 MED ORDER — DICYCLOMINE HCL 20 MG PO TABS: 20.0000 mg | ORAL_TABLET | Freq: Two times a day (BID) | ORAL | 0 refills | Status: DC | PRN

## 2023-02-05 MED ORDER — TRULICITY 1.5 MG/0.5ML ~~LOC~~ SOAJ
1.5000 mg | SUBCUTANEOUS | 0 refills | Status: DC
  Filled 2023-02-05: qty 6, 84d supply, fill #0

## 2023-02-05 NOTE — Telephone Encounter (Signed)
Please Advise Dr.Shamleffer absence

## 2023-02-05 NOTE — Patient Instructions (Signed)
It was great to see you!  Warm tea for bedtime  I will be in touch with your results  I have placed referral for speech therapy -- someone should contact you for this  Follow-up if any concerns  Take care,  Jarold Motto PA-C

## 2023-02-07 LAB — CERVICOVAGINAL ANCILLARY ONLY
Bacterial Vaginitis (gardnerella): NEGATIVE
Candida Glabrata: POSITIVE — AB
Candida Vaginitis: POSITIVE — AB
Chlamydia: NEGATIVE
Comment: NEGATIVE
Comment: NEGATIVE
Comment: NEGATIVE
Comment: NEGATIVE
Comment: NEGATIVE
Comment: NORMAL
Neisseria Gonorrhea: NEGATIVE
Trichomonas: NEGATIVE

## 2023-02-07 NOTE — Telephone Encounter (Signed)
PCP sent in refills instead. She sent in 1.5 mg Trulicity.

## 2023-02-10 ENCOUNTER — Other Ambulatory Visit: Payer: Self-pay | Admitting: Physician Assistant

## 2023-02-10 ENCOUNTER — Telehealth: Payer: Self-pay | Admitting: Physician Assistant

## 2023-02-10 MED ORDER — BORIC ACID VAGINAL 600 MG VA SUPP: VAGINAL | 0 refills | Status: DC

## 2023-02-10 MED ORDER — FLUCONAZOLE 150 MG PO TABS: 150.0000 mg | ORAL_TABLET | Freq: Once | ORAL | 0 refills | Status: AC

## 2023-02-10 NOTE — Telephone Encounter (Signed)
Patient states she was returning [name]'s call. States she is available for callback when able.

## 2023-02-10 NOTE — Telephone Encounter (Signed)
See result notes. 

## 2023-02-26 ENCOUNTER — Ambulatory Visit (HOSPITAL_COMMUNITY): Payer: 59 | Admitting: Speech Pathology

## 2023-03-01 ENCOUNTER — Other Ambulatory Visit: Payer: Self-pay | Admitting: Physician Assistant

## 2023-03-05 ENCOUNTER — Other Ambulatory Visit: Payer: Self-pay | Admitting: Physician Assistant

## 2023-03-05 NOTE — Telephone Encounter (Signed)
Pt requesting refill for Gabapentin. Last OV 02/05/2023.

## 2023-03-19 ENCOUNTER — Encounter (HOSPITAL_COMMUNITY): Payer: Self-pay | Admitting: Speech Pathology

## 2023-03-19 ENCOUNTER — Ambulatory Visit (HOSPITAL_COMMUNITY): Payer: 59 | Attending: Physician Assistant | Admitting: Speech Pathology

## 2023-03-19 ENCOUNTER — Other Ambulatory Visit: Payer: Self-pay | Admitting: Cardiology

## 2023-03-19 DIAGNOSIS — R4701 Aphasia: Secondary | ICD-10-CM | POA: Insufficient documentation

## 2023-03-19 DIAGNOSIS — R41841 Cognitive communication deficit: Secondary | ICD-10-CM | POA: Diagnosis present

## 2023-03-19 DIAGNOSIS — I69890 Apraxia following other cerebrovascular disease: Secondary | ICD-10-CM | POA: Diagnosis present

## 2023-03-19 NOTE — Therapy (Signed)
OUTPATIENT SPEECH LANGUAGE PATHOLOGY APHASIA EVALUATION   Patient Name: Robin [name] MRN: [id number] DOB:1966-04-25, 57 y.o., female Today's Date: 03/19/2023  PCP: Jarold Motto, PA REFERRING PROVIDER: Jarold Motto, PA  END OF SESSION:  End of Session - 03/19/23 1123     Visit Number 1    Number of Visits 9    Date for SLP Re-Evaluation 04/22/23    Authorization Type UHC Medicare and Medicaid secondary   eff 08/05/22 ded 240 met oop 8850 met 2034.20 limit-no auth-no co ins-20%   SLP Start Time 1030    SLP Stop Time  1115    SLP Time Calculation (min) 45 min    Activity Tolerance Patient tolerated treatment well             Past Medical History:  Diagnosis Date   A-fib (HCC)    Abdominal aortic aneurysm (AAA) (HCC)    Allergy    Anemia    Arthritis    Atrial fibrillation (HCC)    CHF (congestive heart failure) (HCC)    a. EF at 30-35% by echo in 08/2018 b. EF at 35% by repeat echo in 05/2019 c. EF improved to 65-70% in 2021 d. EF at 40-45% in 08/2021.   Chronic abdominal pain    Chronic atrial fibrillation (HCC)    Cocaine abuse (HCC)    COPD (chronic obstructive pulmonary disease) (HCC)    Diabetes mellitus without complication (HCC)    Essential hypertension, benign    Expressive aphasia    Expressive aphasia    post CVA   Fatty liver    GERD (gastroesophageal reflux disease)    Gout 2016   Normal coronary arteries    3/10 - following abnormal Myoview   Ovarian cyst    Stroke (HCC) 12/26/2019   Right sided weakness, and expressive aphasia   Stroke [facility])    Thoracic ascending aortic aneurysm (HCC)    4 cm 10/31/19 CTA   Type 2 diabetes mellitus (HCC)    type II   Past Surgical History:  Procedure Laterality Date   ABDOMINAL HYSTERECTOMY  09/10/2011   Procedure: HYSTERECTOMY ABDOMINAL;  Surgeon: Tilda Burrow, MD;  Location: [facility];  Service: Gynecology;  Laterality: N/A;  Abdominal hysterectomy   CESAREAN SECTION  [phone], and 1994    CHOLECYSTECTOMY  1995   IR 3D INDEPENDENT WKST  03/16/2020   IR ANGIO INTRA EXTRACRAN SEL INTERNAL CAROTID BILAT MOD SED  03/16/2020   IR ANGIO VERTEBRAL SEL SUBCLAVIAN INNOMINATE UNI L MOD SED  03/16/2020   IR ANGIO VERTEBRAL SEL VERTEBRAL UNI R MOD SED  03/16/2020   IR CT HEAD LTD  12/27/2019   IR PERCUTANEOUS ART THROMBECTOMY/INFUSION INTRACRANIAL INC DIAG ANGIO  12/27/2019   IR US GUIDE VASC ACCESS RIGHT  12/27/2019   IR US GUIDE VASC ACCESS RIGHT  03/16/2020   RADIOLOGY WITH ANESTHESIA N/A 12/27/2019   Procedure: IR WITH ANESTHESIA;  Surgeon: Julieanne Cotton, MD;  Location: [facility];  Service: Radiology;  Laterality: N/A;   SCAR REVISION  09/10/2011   Procedure: SCAR REVISION;  Surgeon: Tilda Burrow, MD;  Location: [facility];  Service: Gynecology;  Laterality: N/A;  Wide Excision of old Cicatrix   TUBAL LIGATION  1994   Patient Active Problem List   Diagnosis Date Noted   Acute on chronic systolic CHF (congestive heart failure) (HCC) 11/27/2021   Stage 3b chronic kidney disease (CKD) (HCC) 11/27/2021   Microcytic anemia 11/27/2021   Lactic acidosis 11/27/2021   Diabetes  mellitus (HCC) 03/09/2021   Type 2 diabetes mellitus with stage 3a chronic kidney disease, with long-term current use of insulin (HCC) 03/09/2021   Acute metabolic encephalopathy 12/31/2020   Hyperglycemia 12/31/2020   Prolonged QT interval 05/12/2020   History of CVA with residual deficit 05/12/2020   Atrial fibrillation, chronic (HCC) 05/12/2020   Right hemiparesis (HCC)    Essential hypertension    Chronic diastolic congestive heart failure (HCC)    Dysphagia due to recent stroke 01/05/2020   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 01/05/2020   Embolic cerebral infarction [facility]) s/p clot retrieval 12/27/2019   Thoracic aortic aneurysm without rupture (HCC)    Atrial fibrillation with RVR (HCC) 05/10/2019   Type 2 diabetes mellitus with diabetic autonomic neuropathy, with long-term current use of insulin  (HCC) 09/01/2018   Cocaine abuse (HCC) 08/25/2018   Cigarette nicotine dependence, uncomplicated 06/07/2015   GERD (gastroesophageal reflux disease) 06/07/2011   COPD (chronic obstructive pulmonary disease) (HCC) 06/07/2011   Arthritis 06/07/2011    ONSET DATE: May 2021   REFERRING DIAG: R47.01 (ICD-10-CM) - Expressive aphasia  THERAPY DIAG:  Aphasia  Apraxia following other cerebrovascular disease  Cognitive communication deficit  Rationale for Evaluation and Treatment: Rehabilitation  SUBJECTIVE:   SUBJECTIVE STATEMENT: "A girl and woman and got some things...sitting down."  Pt accompanied by: self and family member  PERTINENT HISTORY: Robin [name] is a pleasant 57 y.o. female with medical history significant for atrial fibrillation on Eliquis, chronic systolic CHF, CKD 3B, insulin-dependent diabetes mellitus, polysubstance abuse, COPD, GERD, HTN, and history of CVA with residual right-sided weakness and aphasia (L MCA CVA and L ICA occlusion s/p thrombectomy on 12/27/2019 followed by CIR stay where she received ST/OT/PT until d/c home on 02/02/20 and then received some [facility] SLP services). She is referred for outpatient SLP therapy by Jarold Motto, PA to facilitate increased independence with communication.  PAIN:  Are you having pain? No  FALLS: Has patient fallen in last 6 months?  No  LIVING ENVIRONMENT: Lives with: lives alone and aid for a few hours a day Lives in: House/apartment  PLOF:  Level of assistance: Independent with ADLs, Independent with IADLs Employment: On disability  PATIENT GOALS: Increase ability to talk  OBJECTIVE:   DIAGNOSTIC FINDINGS:  MRI 11/13/2020: IMPRESSION: No evidence of acute intracranial abnormality, including acute infarction.   Redemonstrated large chronic left MCA territory infarct.   Redemonstrated small chronic cortically based infarct within the posterior right temporal lobe.   Mild bilateral ethmoid sinus mucosal  thickening.  COGNITION: Overall cognitive status: Difficulty to assess due to: Communication impairment and has an aide a few hours a day to assist her at home Areas of impairment:  Executive function: Impaired: Problem solving and Planning Functional deficits: Pt oriented to month (was able to write, but not say), has assist at home a few hours a day  AUDITORY COMPREHENSION: Overall auditory comprehension: Impaired: moderately complex YES/NO questions: Impaired: moderately complex Following directions: Impaired: moderately complex Conversation: Moderately Complex Interfering components: working Research scientist (life sciences): extra processing time, repetition/stressing words, written cues, stressing words, and visual/gestural cues  READING COMPREHENSION: Impaired: phrase  EXPRESSION: verbal  VERBAL EXPRESSION: Level of generative/spontaneous verbalization: word and phrase Automatic speech: name: intact, social response: intact, counting: needs assist, and day of week: impaired  Repetition: Impaired: Word Naming: Responsive: 0-25%, Confrontation: 0-25%, and Divergent: 0-25% Pragmatics: Appears intact Comments: Pt is able to write some responses accurately Interfering components:  apraxia Effective technique: sentence completion, phonemic  cues, and written cues Non-verbal means of communication: gestures and writing  WRITTEN EXPRESSION: Dominant hand: left Written expression: Impaired: word  MOTOR SPEECH: Overall motor speech: impaired Level of impairment: Word Respiration:  WFL Phonation: normal Resonance: WFL Articulation: Appears intact Intelligibility: Intelligibility reduced Motor planning: Impaired: aware, groping for words, and inconsistent Motor speech errors: aware Interfering components:  N/A Effective technique:  modeling  ORAL MOTOR EXAMINATION: Overall status: WFL Comments: Initially mild right weakness  STANDARDIZED ASSESSMENTS: MAST  MAST Pt was  administered the [place] Aphasia Screening Tool (MAST) and achieved an overall expressive score of 11/50, receptive score of 30/50, and a total score of 41/100.  Expressive Index Naming 2/10 Automatic Speech 2/10 Repetition 2/10 Writing 0/10 Verbal Fluency 5/10 Expressive Subscale 11/50  Receptive Index Yes/No Accuracy 16/20 Object Recognition 10/10 Following Instructions 4/10 Reading Instructions 0/10 Receptive Subscale 30/50  Total Index Expressive 11/50 Receptive 30/50 Total Score 41/100   TODAY'S TREATMENT:   Evaluation only this date and we discussed plan for treatment                                                                                                                                      DATE: 03/19/23    PATIENT EDUCATION: Education details: Information regarding aphasia and requested that family purchase Pt a small 3-ring binder and complete the personalized communication template [place] educated: Patient and sister, [name] Education method: Explanation and Handouts Education comprehension: verbalized understanding and needs further education   GOALS: Goals reviewed with patient? Yes  SHORT TERM GOALS: Target date: 04/22/2023  Pt will increase verbal naming of common objects/pictures to 75% acc when provided with mod multimodality cues Baseline: 80% when allowed to write response or provided mod/max cues Goal status: INITIAL  2.  Pt will describe objects and pictures by providing at least three salient features (single words ok) as judged by clinician with 80% acc when provided mod cues. Baseline: 70% mod/max cues Goal status: INITIAL  3.  Pt will complete single word sentence completion tasks [facility]) with 90% acc when provided with min/mod multimodality cues.  Baseline: 75% mod cues of initial phoneme modeled Goal status: INITIAL  4.  Pt will complete basic level reading comprehension tasks (picture cues for sentence level) with 80% acc with  provided mi/mod cues. Baseline: 100% word to picture matching f=5, 0/5 sentences Goal status: INITIAL  5.  Pt will verbally describe action photos with use of "who" "what doing" template with 80% acc and min/mod cues.  Baseline: 70% mod cues. Goal status: INITIAL  6.  Pt will complete personally relevant responsive naming tasks with 90% acc with use of written cues as needed. Baseline: 75% acc Goal status: INITIAL  LONG TERM GOALS: Target date: 05/25/2023  Pt will communicate moderately complex wants/needs to [facility] with min assist from communication partner and use of multimodality communication strategies. Baseline: Pt requires mod assist from communication partner  Goal status: INITIAL   ASSESSMENT:  CLINICAL IMPRESSION: Patient is a 57 y.o. female who was seen today for speech/language evaluation s/p CVA in May of 2021 with only limited follow up SLP therapy via home health in 2021 when she was staying with her daughter. [name] sister accompanied her to the evaluation and would like for her sister to be able to communicate her wants and needs verbally. 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 Pt has difficulty verbally labeling high frequency objects (but was able to write some responses), repeating longer words/phrases, and reading phrases. She was able to match 5/5 high frequency objects to single words, but was only able to orally read/label 1/5 words. Pt benefited from gesturing to communicate her intent and this occasionally helped elicit word production and she also benefited from SLP phonemic cues and single word sentence completion cues. Recommend SLP  therapy to address communication deficits and increase independence with communication 2x/week for 4-8 weeks.  OBJECTIVE IMPAIRMENTS: include executive functioning, expressive language, receptive language, aphasia, and apraxia. These impairments are limiting patient from managing medications, managing appointments, managing finances, and effectively communicating at home and in community. Factors affecting potential to achieve goals and functional outcome are ability to learn/carryover information, severity of impairments, and time post onset of aphasia (2021) . Patient will benefit from skilled SLP services to address above impairments and improve overall function.  REHAB POTENTIAL: Good  PLAN:  SLP FREQUENCY: 2x/week  SLP DURATION: 8 weeks  PLANNED INTERVENTIONS: Cueing hierachy, Internal/external aids, Functional tasks, Multimodal communication approach, SLP instruction and feedback, Compensatory strategies, Patient/family education, and Re-evaluation   Thank you,  Havery Moros, CCC-SLP [phone]  ,, CCC-SLP 03/19/2023, 12:14 PM

## 2023-03-21 ENCOUNTER — Ambulatory Visit: Payer: 59 | Admitting: Student

## 2023-03-26 ENCOUNTER — Ambulatory Visit (HOSPITAL_COMMUNITY): Payer: 59 | Admitting: Speech Pathology

## 2023-03-26 ENCOUNTER — Encounter (HOSPITAL_COMMUNITY): Payer: Self-pay | Admitting: Speech Pathology

## 2023-03-26 DIAGNOSIS — R4701 Aphasia: Secondary | ICD-10-CM

## 2023-03-26 DIAGNOSIS — R41841 Cognitive communication deficit: Secondary | ICD-10-CM

## 2023-03-26 DIAGNOSIS — I69890 Apraxia following other cerebrovascular disease: Secondary | ICD-10-CM

## 2023-03-26 NOTE — Therapy (Signed)
OUTPATIENT SPEECH LANGUAGE PATHOLOGY TREATMENT   Patient Name: Robin [name] MRN: [id number] DOB:[birth year], 57 y.o., female Today's Date: 03/26/2023  PCP: Jarold Motto, PA REFERRING PROVIDER: Jarold Motto, PA  END OF SESSION:  End of Session - 03/26/23 1234     Visit Number 2    Number of Visits 9    Date for SLP Re-Evaluation 04/22/23    Authorization Type UHC Medicare and Medicaid secondary   eff 08/05/22 ded 240 met oop 8850 met 2034.20 limit-no auth-no co ins-20%   SLP Start Time 0945    SLP Stop Time  1030    SLP Time Calculation (min) 45 min    Activity Tolerance Patient tolerated treatment well             Past Medical History:  Diagnosis Date   A-fib (HCC)    Abdominal aortic aneurysm (AAA) (HCC)    Allergy    Anemia    Arthritis    Atrial fibrillation (HCC)    CHF (congestive heart failure) (HCC)    a. EF at 30-35% by echo in 08/2018 b. EF at 35% by repeat echo in 05/2019 c. EF improved to 65-70% in 2021 d. EF at 40-45% in 08/2021.   Chronic abdominal pain    Chronic atrial fibrillation (HCC)    Cocaine abuse (HCC)    COPD (chronic obstructive pulmonary disease) (HCC)    Diabetes mellitus without complication (HCC)    Essential hypertension, benign    Expressive aphasia    Expressive aphasia    post CVA   Fatty liver    GERD (gastroesophageal reflux disease)    Gout 2016   Normal coronary arteries    3/10 - following abnormal Myoview   Ovarian cyst    Stroke (HCC) 12/26/2019   Right sided weakness, and expressive aphasia   Stroke [facility])    Thoracic ascending aortic aneurysm (HCC)    4 cm 10/31/19 CTA   Type 2 diabetes mellitus (HCC)    type II   Past Surgical History:  Procedure Laterality Date   ABDOMINAL HYSTERECTOMY  09/10/2011   Procedure: HYSTERECTOMY ABDOMINAL;  Surgeon: Tilda Burrow, MD;  Location: [facility];  Service: Gynecology;  Laterality: N/A;  Abdominal hysterectomy   CESAREAN SECTION  [phone], and 1994   CHOLECYSTECTOMY   1995   IR 3D INDEPENDENT WKST  03/16/2020   IR ANGIO INTRA EXTRACRAN SEL INTERNAL CAROTID BILAT MOD SED  03/16/2020   IR ANGIO VERTEBRAL SEL SUBCLAVIAN INNOMINATE UNI L MOD SED  03/16/2020   IR ANGIO VERTEBRAL SEL VERTEBRAL UNI R MOD SED  03/16/2020   IR CT HEAD LTD  12/27/2019   IR PERCUTANEOUS ART THROMBECTOMY/INFUSION INTRACRANIAL INC DIAG ANGIO  12/27/2019   IR US GUIDE VASC ACCESS RIGHT  12/27/2019   IR US GUIDE VASC ACCESS RIGHT  03/16/2020   RADIOLOGY WITH ANESTHESIA N/A 12/27/2019   Procedure: IR WITH ANESTHESIA;  Surgeon: Julieanne Cotton, MD;  Location: [facility];  Service: Radiology;  Laterality: N/A;   SCAR REVISION  09/10/2011   Procedure: SCAR REVISION;  Surgeon: Tilda Burrow, MD;  Location: [facility];  Service: Gynecology;  Laterality: N/A;  Wide Excision of old Cicatrix   TUBAL LIGATION  1994   Patient Active Problem List   Diagnosis Date Noted   Acute on chronic systolic CHF (congestive heart failure) (HCC) 11/27/2021   Stage 3b chronic kidney disease (CKD) (HCC) 11/27/2021   Microcytic anemia 11/27/2021   Lactic acidosis 11/27/2021   Diabetes mellitus (  HCC) 03/09/2021   Type 2 diabetes mellitus with stage 3a chronic kidney disease, with long-term current use of insulin (HCC) 03/09/2021   Acute metabolic encephalopathy 12/31/2020   Hyperglycemia 12/31/2020   Prolonged QT interval 05/12/2020   History of CVA with residual deficit 05/12/2020   Atrial fibrillation, chronic (HCC) 05/12/2020   Right hemiparesis (HCC)    Essential hypertension    Chronic diastolic congestive heart failure (HCC)    Dysphagia due to recent stroke 01/05/2020   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 01/05/2020   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 12/27/2019   Thoracic aortic aneurysm without rupture (HCC)    Atrial fibrillation with RVR (HCC) 05/10/2019   Type 2 diabetes mellitus with diabetic autonomic neuropathy, with long-term current use of insulin (HCC) 09/01/2018    Cocaine abuse (HCC) 08/25/2018   Cigarette nicotine dependence, uncomplicated 06/07/2015   GERD (gastroesophageal reflux disease) 06/07/2011   COPD (chronic obstructive pulmonary disease) (HCC) 06/07/2011   Arthritis 06/07/2011    ONSET DATE: May 2021   REFERRING DIAG: R47.01 (ICD-10-CM) - Expressive aphasia  THERAPY DIAG:  Aphasia  Apraxia following other cerebrovascular disease  Cognitive communication deficit  Rationale for Evaluation and Treatment: Rehabilitation  SUBJECTIVE:   SUBJECTIVE STATEMENT: "Babe."  Pt accompanied by: self and family member  PERTINENT HISTORY: Robin [name] is a pleasant 57 y.o. female with medical history significant for atrial fibrillation on Eliquis, chronic systolic CHF, CKD 3B, insulin-dependent diabetes mellitus, polysubstance abuse, COPD, GERD, HTN, and history of CVA with residual right-sided weakness and aphasia (L MCA CVA and L ICA occlusion s/p thrombectomy on 12/27/2019 followed by CIR stay where she received ST/OT/PT until d/c home on 02/02/20 and then received some [facility] SLP services). She is referred for outpatient SLP therapy by Jarold Motto, PA to facilitate increased independence with communication.  PAIN:  Are you having pain? No  PATIENT GOALS: Increase ability to talk  OBJECTIVE:   DIAGNOSTIC FINDINGS:  MRI 11/13/2020: IMPRESSION: No evidence of acute intracranial abnormality, including acute infarction.   Redemonstrated large chronic left MCA territory infarct.   Redemonstrated small chronic cortically based infarct within the posterior right temporal lobe.   Mild bilateral ethmoid sinus mucosal thickening.  PATIENT EDUCATION: Education details: Information regarding aphasia and requested that family purchase Pt a small 3-ring binder and complete the personalized communication template [name] educated: Patient and sister, Robin [name] Education method: Explanation and Handouts Education comprehension: verbalized  understanding and needs further education   GOALS: Goals reviewed with patient? Yes  SHORT TERM GOALS: Target date: 04/22/2023  Pt will increase verbal naming of common objects/pictures to 75% acc when provided with mod multimodality cues Baseline: 80% when allowed to write response or provided mod/max cues Goal status: ONGOING  2.  Pt will describe objects and pictures by providing at least three salient features (single words ok) as judged by clinician with 80% acc when provided mod cues. Baseline: 70% mod/max cues Goal status: ONGOING  3.  Pt will complete single word sentence completion tasks [facility]) with 90% acc when provided with min/mod multimodality cues.  Baseline: 75% mod cues of initial phoneme modeled Goal status: ONGOING  4.  Pt will complete basic level reading comprehension tasks (picture cues for sentence level) with 80% acc with provided mi/mod cues. Baseline: 100% word to picture matching f=5, 0/5 sentences Goal status: ONGOING  5.  Pt will verbally describe action photos with use of "who" "what doing" template with 80% acc and min/mod cues.  Baseline: 70% mod cues. Goal status: ONGOING  6.  Pt will complete personally relevant responsive naming tasks with 90% acc with use of written cues as needed. Baseline: 75% acc Goal status: ONGOING  LONG TERM GOALS: Target date: 05/25/2023  Pt will communicate moderately complex wants/needs to [facility] with min assist from communication partner and use of multimodality communication strategies. Baseline: Pt requires mod assist from communication partner  Goal status: ONGOING   ASSESSMENT:  TODAY'S TREATMENT:   Pt accompanied to therapy this date by her caregiver, Agustin Cree and her sister. They brought in a notebook and completed a good portion of the personalized communication template. SLP reviewed the information with Pt and Pt asked to read names of family members. Pt able to read 75% of the names and benefited from cues to  look away/provide diversion and return to task when she could not read it right away. Family provided education on ways to help her with this at home. She was able to write the letters with her finger on the table 90% of the time and even orally spell when unable to say the word. Pt completed automatic speech tasks with 80% acc, but needed cues to decrease rate to increase accuracy of responses. Goals and evaluation were reviewed. Pt smiling and engaged throughout the session. She occasionally stated, "I can't do that", but with encouragement she tried to complete the request. Her sister commented that she seems to be able to communicate with some family members better than others and SLP suggested that she ascertain the content of conversation (ie. Harder to talk about personal needs with sister than lighter conversation with an aunt). Target above goals next session.                                                                                                                                         DATE: 03/26/23  CLINICAL IMPRESSION: (from initial evaluation on 03/19/2023) Patient is a 57 y.o. female who was seen today for speech/language evaluation s/p CVA in May of 2021 with only limited follow up SLP therapy via home health in 2021 when she was staying with her daughter. [name] sister accompanied her to the evaluation and would like for her sister to be able to communicate her wants and needs verbally. 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 Pt has difficulty verbally labeling high frequency objects (but was able to write some responses), repeating longer words/phrases, and  reading phrases. She was able to match 5/5 high frequency objects to single words, but was only able to orally read/label 1/5 words. Pt benefited from gesturing to communicate her intent and  this occasionally helped elicit word production and she also benefited from SLP phonemic cues and single word sentence completion cues. Recommend SLP therapy to address communication deficits and increase independence with communication 2x/week for 4-8 weeks.  OBJECTIVE IMPAIRMENTS: include executive functioning, expressive language, receptive language, aphasia, and apraxia. These impairments are limiting patient from managing medications, managing appointments, managing finances, and effectively communicating at home and in community. Factors affecting potential to achieve goals and functional outcome are ability to learn/carryover information, severity of impairments, and time post onset of aphasia (2021) . Patient will benefit from skilled SLP services to address above impairments and improve overall function.  REHAB POTENTIAL: Good  PLAN:  SLP FREQUENCY: 2x/week  SLP DURATION: 8 weeks  PLANNED INTERVENTIONS: Cueing hierachy, Internal/external aids, Functional tasks, Multimodal communication approach, SLP instruction and feedback, Compensatory strategies, Patient/family education, and Re-evaluation   Thank you,  Havery Moros, CCC-SLP [phone]  Seon Gaertner, CCC-SLP 03/26/2023, 12:35 PM

## 2023-04-01 ENCOUNTER — Encounter (HOSPITAL_COMMUNITY): Payer: 59 | Admitting: Speech Pathology

## 2023-04-03 ENCOUNTER — Encounter (HOSPITAL_COMMUNITY): Payer: 59 | Admitting: Speech Pathology

## 2023-04-03 ENCOUNTER — Ambulatory Visit (HOSPITAL_COMMUNITY): Payer: 59 | Admitting: Speech Pathology

## 2023-04-03 ENCOUNTER — Encounter (HOSPITAL_COMMUNITY): Payer: Self-pay | Admitting: Speech Pathology

## 2023-04-03 DIAGNOSIS — R4701 Aphasia: Secondary | ICD-10-CM | POA: Diagnosis not present

## 2023-04-03 DIAGNOSIS — I69890 Apraxia following other cerebrovascular disease: Secondary | ICD-10-CM

## 2023-04-03 DIAGNOSIS — R41841 Cognitive communication deficit: Secondary | ICD-10-CM

## 2023-04-03 NOTE — Therapy (Signed)
OUTPATIENT SPEECH LANGUAGE PATHOLOGY TREATMENT   Patient Name: Robin [name] MRN: [id number] DOB:1966/06/02, 57 y.o., female Today's Date: 04/03/2023  PCP: Jarold Motto, PA REFERRING PROVIDER: Jarold Motto, PA  END OF SESSION:  End of Session - 04/03/23 1002     Visit Number 3    Number of Visits 9    Date for SLP Re-Evaluation 04/22/23    Authorization Type UHC Medicare and Medicaid secondary   eff 08/05/22 ded 240 met oop 8850 met 2034.20 limit-no auth-no co ins-20%   SLP Start Time 0950    SLP Stop Time  1035    SLP Time Calculation (min) 45 min    Activity Tolerance Patient tolerated treatment well             Past Medical History:  Diagnosis Date   A-fib (HCC)    Abdominal aortic aneurysm (AAA) (HCC)    Allergy    Anemia    Arthritis    Atrial fibrillation (HCC)    CHF (congestive heart failure) (HCC)    a. EF at 30-35% by echo in 08/2018 b. EF at 35% by repeat echo in 05/2019 c. EF improved to 65-70% in 2021 d. EF at 40-45% in 08/2021.   Chronic abdominal pain    Chronic atrial fibrillation (HCC)    Cocaine abuse (HCC)    COPD (chronic obstructive pulmonary disease) (HCC)    Diabetes mellitus without complication (HCC)    Essential hypertension, benign    Expressive aphasia    Expressive aphasia    post CVA   Fatty liver    GERD (gastroesophageal reflux disease)    Gout 2016   Normal coronary arteries    3/10 - following abnormal Myoview   Ovarian cyst    Stroke (HCC) 12/26/2019   Right sided weakness, and expressive aphasia   Stroke [facility])    Thoracic ascending aortic aneurysm (HCC)    4 cm 10/31/19 CTA   Type 2 diabetes mellitus (HCC)    type II   Past Surgical History:  Procedure Laterality Date   ABDOMINAL HYSTERECTOMY  09/10/2011   Procedure: HYSTERECTOMY ABDOMINAL;  Surgeon: Tilda Burrow, MD;  Location: [facility];  Service: Gynecology;  Laterality: N/A;  Abdominal hysterectomy   CESAREAN SECTION  [phone], and 1994   CHOLECYSTECTOMY   1995   IR 3D INDEPENDENT WKST  03/16/2020   IR ANGIO INTRA EXTRACRAN SEL INTERNAL CAROTID BILAT MOD SED  03/16/2020   IR ANGIO VERTEBRAL SEL SUBCLAVIAN INNOMINATE UNI L MOD SED  03/16/2020   IR ANGIO VERTEBRAL SEL VERTEBRAL UNI R MOD SED  03/16/2020   IR CT HEAD LTD  12/27/2019   IR PERCUTANEOUS ART THROMBECTOMY/INFUSION INTRACRANIAL INC DIAG ANGIO  12/27/2019   IR US GUIDE VASC ACCESS RIGHT  12/27/2019   IR US GUIDE VASC ACCESS RIGHT  03/16/2020   RADIOLOGY WITH ANESTHESIA N/A 12/27/2019   Procedure: IR WITH ANESTHESIA;  Surgeon: Julieanne Cotton, MD;  Location: [facility];  Service: Radiology;  Laterality: N/A;   SCAR REVISION  09/10/2011   Procedure: SCAR REVISION;  Surgeon: Tilda Burrow, MD;  Location: [facility];  Service: Gynecology;  Laterality: N/A;  Wide Excision of old Cicatrix   TUBAL LIGATION  1994   Patient Active Problem List   Diagnosis Date Noted   Acute on chronic systolic CHF (congestive heart failure) (HCC) 11/27/2021   Stage 3b chronic kidney disease (CKD) (HCC) 11/27/2021   Microcytic anemia 11/27/2021   Lactic acidosis 11/27/2021   Diabetes mellitus (  HCC) 03/09/2021   Type 2 diabetes mellitus with stage 3a chronic kidney disease, with long-term current use of insulin (HCC) 03/09/2021   Acute metabolic encephalopathy 12/31/2020   Hyperglycemia 12/31/2020   Prolonged QT interval 05/12/2020   History of CVA with residual deficit 05/12/2020   Atrial fibrillation, chronic (HCC) 05/12/2020   Right hemiparesis (HCC)    Essential hypertension    Chronic diastolic congestive heart failure (HCC)    Dysphagia due to recent stroke 01/05/2020   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 01/05/2020   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/p clot retrieval 12/27/2019   Thoracic aortic aneurysm without rupture (HCC)    Atrial fibrillation with RVR (HCC) 05/10/2019   Type 2 diabetes mellitus with diabetic autonomic neuropathy, with long-term current use of insulin (HCC) 09/01/2018    Cocaine abuse (HCC) 08/25/2018   Cigarette nicotine dependence, uncomplicated 06/07/2015   GERD (gastroesophageal reflux disease) 06/07/2011   COPD (chronic obstructive pulmonary disease) (HCC) 06/07/2011   Arthritis 06/07/2011    ONSET DATE: May 2021   REFERRING DIAG: R47.01 (ICD-10-CM) - Expressive aphasia  THERAPY DIAG:  Aphasia  Apraxia following other cerebrovascular disease  Cognitive communication deficit  Rationale for Evaluation and Treatment: Rehabilitation  SUBJECTIVE:   SUBJECTIVE STATEMENT: "What do you mean?"  Pt accompanied by: self and family member  PERTINENT HISTORY: Dlila Galanis [name] is a pleasant 57 y.o. female with medical history significant for atrial fibrillation on Eliquis, chronic systolic CHF, CKD 3B, insulin-dependent diabetes mellitus, polysubstance abuse, COPD, GERD, HTN, and history of CVA with residual right-sided weakness and aphasia (L MCA CVA and L ICA occlusion s/p thrombectomy on 12/27/2019 followed by CIR stay where she received ST/OT/PT until d/c home on 02/02/20 and then received some [facility] SLP services). She is referred for outpatient SLP therapy by Jarold Motto, PA to facilitate increased independence with communication.  PAIN:  Are you having pain? No  PATIENT GOALS: Increase ability to talk  OBJECTIVE:   DIAGNOSTIC FINDINGS:  MRI 11/13/2020: IMPRESSION: No evidence of acute intracranial abnormality, including acute infarction.   Redemonstrated large chronic left MCA territory infarct.   Redemonstrated small chronic cortically based infarct within the posterior right temporal lobe.   Mild bilateral ethmoid sinus mucosal thickening.  PATIENT EDUCATION: Education details: Practice the "Say it, Write it, Read it" HEP provided with pictures [place] educated: Patient and sister, [name] Education method: Explanation and Handouts Education comprehension: verbalized understanding and needs further education   GOALS: Goals reviewed with  patient? Yes  SHORT TERM GOALS: Target date: 04/22/2023  Pt will increase verbal naming of common objects/pictures to 75% acc when provided with mod multimodality cues Baseline: 80% when allowed to write response or provided mod/max cues Goal status: ONGOING  2.  Pt will describe objects and pictures by providing at least three salient features (single words ok) as judged by clinician with 80% acc when provided mod cues. Baseline: 70% mod/max cues Goal status: ONGOING  3.  Pt will complete single word sentence completion tasks [facility]) with 90% acc when provided with min/mod multimodality cues.  Baseline: 75% mod cues of initial phoneme modeled Goal status: ONGOING  4.  Pt will complete basic level reading comprehension tasks (picture cues for sentence level) with 80% acc with provided mi/mod cues. Baseline: 100% word to picture matching f=5, 0/5 sentences Goal status: ONGOING  5.  Pt will verbally describe action photos with use of "who" "what doing" template with 80% acc and min/mod cues.  Baseline: 70% mod cues.  Goal status: ONGOING  6.  Pt will complete personally relevant responsive naming tasks with 90% acc with use of written cues as needed. Baseline: 75% acc Goal status: ONGOING  LONG TERM GOALS: Target date: 05/25/2023  Pt will communicate moderately complex wants/needs to [facility] with min assist from communication partner and use of multimodality communication strategies. Baseline: Pt requires mod assist from communication partner  Goal status: ONGOING   ASSESSMENT:  PREVIOUS TREATMENT:     03/26/23: Pt accompanied to therapy this date by her caregiver, Agustin Cree and her sister. They brought in a notebook and completed a good portion of the personalized communication template. SLP reviewed the information with Pt and Pt asked to read names of family members. Pt able to read 75% of the names and benefited from cues to look away/provide diversion and return to task when she could  not read it right away. Family provided education on ways to help her with this at home. She was able to write the letters with her finger on the table 90% of the time and even orally spell when unable to say the word. Pt completed automatic speech tasks with 80% acc, but needed cues to decrease rate to increase accuracy of responses. Goals and evaluation were reviewed. Pt smiling and engaged throughout the session. She occasionally stated, "I can't do that", but with encouragement she tried to complete the request. Her sister commented that she seems to be able to communicate with some family members better than others and SLP suggested that she ascertain the content of conversation (ie. Harder to talk about personal needs with sister than lighter conversation with an aunt). Target above goals next session.   CURRENT TREATMENT:  Pt accompanied by caregiver, Agustin Cree and brought in her notebook. Pt completed confrontation naming task with 50% acc without cues and increased to 90% acc with cues (Pt able to use letter board to spell the word 90% of the time). She completed single word sentence completion (SWSC) task by providing accurate verbal response when looking at pictures with 80% acc with mi/mod cues. She was then asked to repeat the word x3 and SLP interjected with confounds at times and Pt asked to return to word. This was very challenging for Pt with performance improved throughout trials. She was able to provide a gesture for each work with 100% acc. She answered responsive naming questions with use of personal communication template as needed with 95% acc. SLP provided strategies for accurate number production by having Pt count on her fingers and show the number on her hands and then verbalize the number, which appeared effective. Introduce the "Say it" set list of words next session.                                                                                                                                          DATE: 04/03/23  CLINICAL IMPRESSION: (  from initial evaluation on 03/19/2023) Patient is a 56 y.o. female who was seen today for speech/language evaluation s/p CVA in May of 2021 with only limited follow up SLP therapy via home health in 2021 when she was staying with her daughter. [name] sister accompanied her to the evaluation and would like for her sister to be able to communicate her wants and needs verbally. 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 Pt has difficulty verbally labeling high frequency objects (but was able to write some responses), repeating longer words/phrases, and reading phrases. She was able to match 5/5 high frequency objects to single words, but was only able to orally read/label 1/5 words. Pt benefited from gesturing to communicate her intent and this occasionally helped elicit word production and she also benefited from SLP phonemic cues and single word sentence completion cues. Recommend SLP therapy to address communication deficits and increase independence with communication 2x/week for 4-8 weeks.  OBJECTIVE IMPAIRMENTS: include executive functioning, expressive language, receptive language, aphasia, and apraxia. These impairments are limiting patient from managing medications, managing appointments, managing finances, and effectively communicating at home and in community. Factors affecting potential to achieve goals and functional outcome are ability to learn/carryover information, severity of impairments, and time post onset of aphasia (2021) . Patient will benefit from skilled SLP services to address above impairments and improve  overall function.  REHAB POTENTIAL: Good  PLAN:  SLP FREQUENCY: 2x/week  SLP DURATION: 8 weeks  PLANNED INTERVENTIONS: Cueing hierachy, Internal/external aids, Functional tasks, Multimodal communication approach, SLP instruction and feedback, Compensatory strategies, Patient/family education, and Re-evaluation   Thank you,  Havery Moros, CCC-SLP [phone]  Deniz Hannan, CCC-SLP 04/03/2023, 2:18 PM

## 2023-04-08 ENCOUNTER — Emergency Department (HOSPITAL_COMMUNITY): Payer: 59

## 2023-04-08 ENCOUNTER — Emergency Department (HOSPITAL_COMMUNITY)
Admission: EM | Admit: 2023-04-08 | Discharge: 2023-04-08 | Disposition: A | Discharge status: Home / Self Care | Payer: 59 | Attending: Emergency Medicine | Admitting: Emergency Medicine

## 2023-04-08 ENCOUNTER — Other Ambulatory Visit: Payer: Self-pay

## 2023-04-08 ENCOUNTER — Encounter (HOSPITAL_COMMUNITY): Payer: Self-pay

## 2023-04-08 ENCOUNTER — Encounter (HOSPITAL_COMMUNITY): Payer: 59 | Admitting: Speech Pathology

## 2023-04-08 DIAGNOSIS — E119 Type 2 diabetes mellitus without complications: Secondary | ICD-10-CM | POA: Insufficient documentation

## 2023-04-08 DIAGNOSIS — R079 Chest pain, unspecified: Secondary | ICD-10-CM | POA: Diagnosis present

## 2023-04-08 DIAGNOSIS — Z8673 Personal history of transient ischemic attack (TIA), and cerebral infarction without residual deficits: Secondary | ICD-10-CM | POA: Insufficient documentation

## 2023-04-08 DIAGNOSIS — R0789 Other chest pain: Secondary | ICD-10-CM | POA: Diagnosis not present

## 2023-04-08 DIAGNOSIS — Z7982 Long term (current) use of aspirin: Secondary | ICD-10-CM | POA: Diagnosis not present

## 2023-04-08 DIAGNOSIS — Z7901 Long term (current) use of anticoagulants: Secondary | ICD-10-CM | POA: Diagnosis not present

## 2023-04-08 DIAGNOSIS — Z794 Long term (current) use of insulin: Secondary | ICD-10-CM | POA: Insufficient documentation

## 2023-04-08 LAB — BASIC METABOLIC PANEL
Anion gap: 8 (ref 5–15)
BUN: 23 mg/dL — ABNORMAL HIGH (ref 6–20)
CO2: 23 mmol/L (ref 22–32)
Calcium: 8.4 mg/dL — ABNORMAL LOW (ref 8.9–10.3)
Chloride: 105 mmol/L (ref 98–111)
Creatinine, Ser: 1.77 mg/dL — ABNORMAL HIGH (ref 0.44–1.00)
GFR, Estimated: 33 mL/min — ABNORMAL LOW (ref >60)
Glucose, Bld: 170 mg/dL — ABNORMAL HIGH (ref 70–99)
Potassium: 3.8 mmol/L (ref 3.5–5.1)
Sodium: 136 mmol/L (ref 135–145)

## 2023-04-08 LAB — CBC WITH DIFFERENTIAL/PLATELET
Abs Immature Granulocytes: 0.01 10*3/uL (ref 0.00–0.07)
Basophils Absolute: 0.1 10*3/uL (ref 0.0–0.1)
Basophils Relative: 1 %
Eosinophils Absolute: 0.2 10*3/uL (ref 0.0–0.5)
Eosinophils Relative: 3 %
HCT: 36 % (ref 36.0–46.0)
Hemoglobin: 11.1 g/dL — ABNORMAL LOW (ref 12.0–15.0)
Immature Granulocytes: 0 %
Lymphocytes Relative: 34 %
Lymphs Abs: 2 10*3/uL (ref 0.7–4.0)
MCH: 26.8 pg (ref 26.0–34.0)
MCHC: 30.8 g/dL (ref 30.0–36.0)
MCV: 87 fL (ref 80.0–100.0)
Monocytes Absolute: 0.7 10*3/uL (ref 0.1–1.0)
Monocytes Relative: 11 %
Neutro Abs: 3.1 10*3/uL (ref 1.7–7.7)
Neutrophils Relative %: 51 %
Platelets: 260 10*3/uL (ref 150–400)
RBC: 4.14 MIL/uL (ref 3.87–5.11)
RDW: 15.5 % (ref 11.5–15.5)
WBC: 6 10*3/uL (ref 4.0–10.5)
nRBC: 0 % (ref 0.0–0.2)

## 2023-04-08 LAB — TROPONIN I (HIGH SENSITIVITY)
Troponin I (High Sensitivity): 8 ng/L (ref <18)
Troponin I (High Sensitivity): 8 ng/L (ref <18)

## 2023-04-08 MED ORDER — KETOROLAC TROMETHAMINE 30 MG/ML IJ SOLN
30.0000 mg | Freq: Once | INTRAMUSCULAR | Status: AC
  Administered 2023-04-08: 30 mg via INTRAVENOUS
  Filled 2023-04-08: qty 1

## 2023-04-08 NOTE — Discharge Instructions (Addendum)
Your tests that were done today were normal.  Sometimes you get some inflammation in your chest wall that can cause some pain.  You can take Tylenol and ibuprofen at home.  Please follow-up with your primary care doctor.

## 2023-04-08 NOTE — ED Triage Notes (Signed)
C/o left sided cp that started this am with nose bleed and dizziness.  Denies radiation.  Ems reports alert to voice and difficult to arouse on arrival.  Ems admin 324mg  ASA.  Hx of stroke with right sided deficits.  A-fib with ems with no hx.

## 2023-04-08 NOTE — ED Provider Notes (Signed)
[place] EMERGENCY DEPARTMENT AT [facility] Provider Note   CSN: [id number] Arrival date & time: 04/08/23  1318     History  Chief Complaint  Patient presents with   Chest Pain    Robin [name] is a 57 y.o. female.  This is a 57 year old female here today for chest pain and nosebleed.  Patient has a past history of diabetes, stroke.  She is here with her son who is legal guardian.  Patient reportedly started having pain over the left side of her chest today.  She says it is painful when she pushes on the area.  She is also had a nosebleed for few days.  She denies any shortness of breath.   Chest Pain      Home Medications Prior to Admission medications   Medication Sig Start Date End Date Taking? Authorizing Provider  aspirin EC 81 MG tablet Take 1 tablet (81 mg total) by mouth daily. Swallow whole. 07/19/20  Yes Strader, Lennart Pall, PA-C  Boric Acid Vaginal 600 MG SUPP Insert one suppository into vagina nightly x 3 weeks. 02/10/23  Yes Worley, Lelon Mast, PA  carvedilol (COREG) 25 MG tablet TAKE 2 TABLETS BY MOUTH TWICE DAILY WITH A MEAL 03/19/23  Yes Branch, Dorothe Pea, MD  cetaphil (CETAPHIL) lotion Apply 1 Application topically as needed for dry skin. 10/11/22  Yes Sabas Sous, MD  dicyclomine (BENTYL) 20 MG tablet Take 1 tablet (20 mg total) by mouth 2 (two) times daily as needed for spasms. 02/05/23  Yes Worley, Lelon Mast, PA  Dulaglutide (TRULICITY) 1.5 MG/0.5ML SOPN Inject 1.5 mg into the skin once a week. 02/05/23  Yes Jarold Motto, PA  ELIQUIS 5 MG TABS tablet Take 1 tablet by mouth twice daily 03/03/23  Yes Bufford Buttner, [place], PA  empagliflozin (JARDIANCE) 10 MG TABS tablet Take 1 tablet (10 mg total) by mouth daily before breakfast. 10/30/22  Yes Shamleffer, Konrad Dolores, MD  Ferrous Sulfate (IRON) 325 (65 Fe) MG TABS Take 1 tablet by mouth once daily 01/28/23  Yes Worley, [place], PA  furosemide (LASIX) 20 MG tablet TAKE 1 TABLET BY MOUTH ONCE DAILY (MAY  TAKE ADDITIONAL 20 MG IF WEIGHT GAIN IS OVER 2LBS OVERNIGHT OR 5 LBS IN A WEEK) 08/12/22  Yes Branch, Dorothe Pea, MD  gabapentin (NEURONTIN) 100 MG capsule TAKE 2 CAPSULES BY MOUTH THREE TIMES DAILY 03/05/23  Yes Bufford Buttner, [place], PA  hydrALAZINE (APRESOLINE) 25 MG tablet TAKE 1 TABLET BY MOUTH THREE TIMES DAILY 12/02/22  Yes Jarold Motto, PA  insulin aspart (NOVOLOG FLEXPEN) 100 UNIT/ML FlexPen Inject 12 Units into the skin 3 (three) times daily with meals. 10/30/22  Yes Shamleffer, Konrad Dolores, MD  insulin glargine (LANTUS SOLOSTAR) 100 UNIT/ML Solostar Pen Inject 44 Units into the skin daily. 10/30/22  Yes Shamleffer, Konrad Dolores, MD  insulin lispro (HUMALOG KWIKPEN) 100 UNIT/ML KwikPen Inject 12 Units into the skin 3 (three) times daily with meals 10/30/22  Yes Shamleffer, Konrad Dolores, MD  isosorbide mononitrate (IMDUR) 30 MG 24 hr tablet Take 2 tablets by mouth once daily 12/02/22  Yes Bufford Buttner, [place], PA  pantoprazole (PROTONIX) 40 MG tablet Take 1 tablet by mouth once daily 12/02/22  Yes Worley, [place], PA  timolol (TIMOPTIC) 0.5 % ophthalmic solution Place 1 drop into both eyes 2 (two) times daily. 07/05/22  Yes [provider]  Accu-Chek Softclix Lancets lancets Use as instructed 06/13/21   Shamleffer, Konrad Dolores, MD  Blood Glucose Monitoring Suppl (ACCU-CHEK GUIDE ME) w/Device KIT 1  Device by Does not apply route 3 (three) times daily. 03/09/21   Shamleffer, Konrad Dolores, MD  Continuous Blood Gluc Sensor (DEXCOM G6 SENSOR) MISC 1 Device by Does not apply route as directed. 05/01/22   Shamleffer, Konrad Dolores, MD  Continuous Blood Gluc Transmit (DEXCOM G6 TRANSMITTER) MISC 1 Device by Does not apply route as directed. 05/01/22   Shamleffer, Konrad Dolores, MD  glucose blood (ACCU-CHEK GUIDE) test strip USE 1 STRIP TO CHECK GLUCOSE THREE TIMES DAILY 08/28/22   Shamleffer, Konrad Dolores, MD  Insulin Pen Needle (BD PEN NEEDLE NANO U/F) 32G X 4 MM MISC Inject 1 Device  into the skin in the morning, at noon, in the evening, and at bedtime. 10/30/22   Shamleffer, Konrad Dolores, MD      Allergies    Bee venom, Losartan, Naproxen, Penicillins, and Lisinopril    Review of Systems   Review of Systems  Cardiovascular:  Positive for chest pain.    Physical Exam Updated Vital Signs BP 102/75   Pulse 91   Temp 97.8 F (36.6 C) (Oral)   Resp 19   Ht 5\' 7"  (1.702 m)   Wt 106 kg   LMP 08/21/2011   SpO2 96%   BMI 36.60 kg/m  Physical Exam Vitals reviewed.  HENT:     Head: Normocephalic and atraumatic.  Cardiovascular:     Rate and Rhythm: Normal rate. Rhythm irregular.  Chest:     Chest wall: Tenderness present. No mass, crepitus or edema.  Abdominal:     Palpations: Abdomen is soft.  Neurological:     Mental Status: She is alert.     ED Results / Procedures / Treatments   Labs (all labs ordered are listed, but only abnormal results are displayed) Labs Reviewed  BASIC METABOLIC PANEL - Abnormal; Notable for the following components:      Result Value   Glucose, Bld 170 (*)    BUN 23 (*)    Creatinine, Ser 1.77 (*)    Calcium 8.4 (*)    GFR, Estimated 33 (*)    All other components within normal limits  CBC WITH DIFFERENTIAL/PLATELET - Abnormal; Notable for the following components:   Hemoglobin 11.1 (*)    All other components within normal limits  TROPONIN I (HIGH SENSITIVITY)  TROPONIN I (HIGH SENSITIVITY)    EKG EKG Interpretation Date/Time:  Tuesday April 08 2023 13:40:48 EDT Ventricular Rate:  92 PR Interval:    QRS Duration:  90 QT Interval:  407 QTC Calculation: 504 R Axis:   66  Text Interpretation: Atrial fibrillation Low voltage, precordial leads Borderline prolonged QT interval Confirmed by Pricilla Loveless [phone]) on 04/08/2023 2:12:56 PM  Radiology DG Chest Portable 1 View  Result Date: 04/08/2023 CLINICAL DATA:  Chest pain EXAM: PORTABLE CHEST 1 VIEW COMPARISON:  09/15/2022 x-ray FINDINGS: Enlarged  cardiopericardial silhouette with some vascular congestion. No pneumothorax, effusion or edema otherwise. No consolidation. Overlapping cardiac leads. IMPRESSION: Enlarged cardiopericardial silhouette with some vascular congestion. Electronically Signed   By: Karen Kays M.D.   On: 04/08/2023 16:40    Procedures Procedures    Medications Ordered in ED Medications  ketorolac (TORADOL) 30 MG/ML injection 30 mg (30 mg Intravenous Given 04/08/23 1609)    ED Course/ Medical Decision Making/ A&P                                 Medical Decision Making 57 year old female here  today for chest pain.  Differential diagnoses include ACS, musculoskeletal chest pain, costochondritis, pneumonia.  Plan-patient does have reproducible pain over the left side of her chest.  EKG nonischemic, does show rate controlled atrial fibrillation which is normal for the patient.  Will obtain a chest x-ray.  Will provide the patient with some Toradol.  Reassessment-EKG nonischemic, 2 times high-sensitivity troponin negative.  Chest x-ray, per my independent review does not show any pneumonia.  Formal read shows some vascular congestion.  Not consistent with the patient's symptoms.  Will discharge patient, prescribed some saline nasal spray for her left nares intermittent nosebleeds.  Amount and/or Complexity of Data Reviewed Independent Historian: guardian  Risk Prescription drug management.           Final Clinical Impression(s) / ED Diagnoses Final diagnoses:  Chest wall pain    Rx / DC Orders ED Discharge Orders     None         Arletha Pili, DO 04/08/23 1752

## 2023-04-09 ENCOUNTER — Encounter (HOSPITAL_COMMUNITY): Payer: Self-pay | Admitting: Speech Pathology

## 2023-04-09 ENCOUNTER — Ambulatory Visit (HOSPITAL_COMMUNITY): Payer: 59 | Attending: Physician Assistant | Admitting: Speech Pathology

## 2023-04-09 DIAGNOSIS — I69890 Apraxia following other cerebrovascular disease: Secondary | ICD-10-CM | POA: Diagnosis present

## 2023-04-09 DIAGNOSIS — R4701 Aphasia: Secondary | ICD-10-CM | POA: Insufficient documentation

## 2023-04-09 DIAGNOSIS — R41841 Cognitive communication deficit: Secondary | ICD-10-CM | POA: Insufficient documentation

## 2023-04-09 NOTE — Therapy (Signed)
OUTPATIENT SPEECH LANGUAGE PATHOLOGY TREATMENT   Patient Name: Robin [name] MRN: [id number] DOB:1966-05-02, 57 y.o., female Today's Date: 04/09/2023  PCP: Robin Motto, PA REFERRING PROVIDER: Jarold Motto, PA  END OF SESSION:  End of Session - 04/09/23 0955     Visit Number 4    Number of Visits 9    Date for SLP Re-Evaluation 04/22/23    Authorization Type [employer]HC Medicare and Medicaid secondary   eff 08/05/22 ded 240 met oop 8850 met 2034.20 limit-no auth-no co ins-20%   SLP Start Time 0952    SLP Stop Time  1035    SLP Time Calculation (min) 43 min    Activity Tolerance Patient tolerated treatment well             Past Medical History:  Diagnosis Date   A-fib (HCC)    Abdominal aortic aneurysm (AAA) (HCC)    Allergy    Anemia    Arthritis    Atrial fibrillation (HCC)    CHF (congestive heart failure) (HCC)    a. EF at 30-35% by echo in 08/2018 b. EF at 35% by repeat echo in 05/2019 c. EF improved to 65-70% in 2021 d. EF at 40-45% in 08/2021.   Chronic abdominal pain    Chronic atrial fibrillation (HCC)    Cocaine abuse (HCC)    COPD (chronic obstructive pulmonary disease) (HCC)    Diabetes mellitus without complication (HCC)    Essential hypertension, benign    Expressive aphasia    Expressive aphasia    post CVA   Fatty liver    GERD (gastroesophageal reflux disease)    Gout 2016   Normal coronary arteries    3/10 - following abnormal Myoview   Ovarian cyst    Stroke (HCC) 12/26/2019   Right sided weakness, and expressive aphasia   Stroke [facility])    Thoracic ascending aortic aneurysm (HCC)    4 cm 10/31/19 CTA   Type 2 diabetes mellitus (HCC)    type II   Past Surgical History:  Procedure Laterality Date   ABDOMINAL HYSTERECTOMY  09/10/2011   Procedure: HYSTERECTOMY ABDOMINAL;  Surgeon: Robin Burrow, MD;  Location: [facility];  Service: Gynecology;  Laterality: N/A;  Abdominal hysterectomy   CESAREAN SECTION  [phone], and 1994   CHOLECYSTECTOMY   1995   IR 3D INDEPENDENT WKST  03/16/2020   IR ANGIO INTRA EXTRACRAN SEL INTERNAL CAROTID BILAT MOD SED  03/16/2020   IR ANGIO VERTEBRAL SEL SUBCLAVIAN INNOMINATE UNI L MOD SED  03/16/2020   IR ANGIO VERTEBRAL SEL VERTEBRAL UNI R MOD SED  03/16/2020   IR CT HEAD LTD  12/27/2019   IR PERCUTANEOUS ART THROMBECTOMY/INFUSION INTRACRANIAL INC DIAG ANGIO  12/27/2019   IR US GUIDE VASC ACCESS RIGHT  12/27/2019   IR US GUIDE VASC ACCESS RIGHT  03/16/2020   RADIOLOGY WITH ANESTHESIA N/A 12/27/2019   Procedure: IR WITH ANESTHESIA;  Surgeon: Robin Cotton, MD;  Location: [facility] OR;  Service: Radiology;  Laterality: N/A;   SCAR REVISION  09/10/2011   Procedure: SCAR REVISION;  Surgeon: Robin Burrow, MD;  Location: [facility];  Service: Gynecology;  Laterality: N/A;  Wide Excision of old Cicatrix   TUBAL LIGATION  1994   Patient Active Problem List   Diagnosis Date Noted   Acute on chronic systolic CHF (congestive heart failure) (HCC) 11/27/2021   Stage 3b chronic kidney disease (CKD) (HCC) 11/27/2021   Microcytic anemia 11/27/2021   Lactic acidosis 11/27/2021   Diabetes mellitus (  HCC) 03/09/2021   Type 2 diabetes mellitus with stage 3a chronic kidney disease, with long-term current use of insulin (HCC) 03/09/2021   Acute metabolic encephalopathy 12/31/2020   Hyperglycemia 12/31/2020   Prolonged QT interval 05/12/2020   History of CVA with residual deficit 05/12/2020   Atrial fibrillation, chronic (HCC) 05/12/2020   Right hemiparesis (HCC)    Essential hypertension    Chronic diastolic congestive heart failure (HCC)    Dysphagia due to recent stroke 01/05/2020   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 01/05/2020   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 12/27/2019   Thoracic aortic aneurysm without rupture (HCC)    Atrial fibrillation with RVR (HCC) 05/10/2019   Type 2 diabetes mellitus with diabetic autonomic neuropathy, with long-term current use of insulin (HCC) 09/01/2018    Cocaine abuse (HCC) 08/25/2018   Cigarette nicotine dependence, uncomplicated 06/07/2015   GERD (gastroesophageal reflux disease) 06/07/2011   COPD (chronic obstructive pulmonary disease) (HCC) 06/07/2011   Arthritis 06/07/2011    ONSET DATE: May 2021   REFERRING DIAG: R47.01 (ICD-10-CM) - Expressive aphasia  THERAPY DIAG:  Aphasia  Apraxia following other cerebrovascular disease  Cognitive communication deficit  Rationale for Evaluation and Treatment: Rehabilitation  SUBJECTIVE:   SUBJECTIVE STATEMENT: "Home"  Pt accompanied by: self and family member  PERTINENT HISTORY: Robin [name] is a pleasant 57 y.o. female with medical history significant for atrial fibrillation on Eliquis, chronic systolic CHF, CKD 3B, insulin-dependent diabetes mellitus, polysubstance abuse, COPD, GERD, HTN, and history of CVA with residual right-sided weakness and aphasia (L MCA CVA and L ICA occlusion s/p thrombectomy on 12/27/2019 followed by CIR stay where she received ST/OT/PT until d/c home on 02/02/20 and then received some [facility] SLP services). She is referred for outpatient SLP therapy by Robin Motto, PA to facilitate increased independence with communication.  PAIN:  Are you having pain? No  PATIENT GOALS: Increase ability to talk  OBJECTIVE:   DIAGNOSTIC FINDINGS:  MRI 11/13/2020: IMPRESSION: No evidence of acute intracranial abnormality, including acute infarction.   Redemonstrated large chronic left MCA territory infarct.   Redemonstrated small chronic cortically based infarct within the posterior right temporal lobe.   Mild bilateral ethmoid sinus mucosal thickening.  PATIENT EDUCATION: Education details: Practice the "Say it, Write it, Read it" HEP provided with pictures [place] educated: Patient and sister, Robin [name] Education method: Explanation and Handouts Education comprehension: verbalized understanding and needs further education   GOALS: Goals reviewed with patient?  Yes  SHORT TERM GOALS: Target date: 04/22/2023  Pt will increase verbal naming of common objects/pictures to 75% acc when provided with mod multimodality cues Baseline: 80% when allowed to write response or provided mod/max cues Goal status: ONGOING  2.  Pt will describe objects and pictures by providing at least three salient features (single words ok) as judged by clinician with 80% acc when provided mod cues. Baseline: 70% mod/max cues Goal status: ONGOING  3.  Pt will complete single word sentence completion tasks [facility]) with 90% acc when provided with min/mod multimodality cues.  Baseline: 75% mod cues of initial phoneme modeled Goal status: ONGOING  4.  Pt will complete basic level reading comprehension tasks (picture cues for sentence level) with 80% acc with provided mi/mod cues. Baseline: 100% word to picture matching f=5, 0/5 sentences Goal status: ONGOING  5.  Pt will verbally describe action photos with use of "who" "what doing" template with 80% acc and min/mod cues.  Baseline: 70% mod cues. Goal status: ONGOING  6.  Pt will complete personally relevant responsive naming tasks with 90% acc with use of written cues as needed. Baseline: 75% acc Goal status: ONGOING  LONG TERM GOALS: Target date: 05/25/2023  Pt will communicate moderately complex wants/needs to [employer] with min assist from communication partner and use of multimodality communication strategies. Baseline: Pt requires mod assist from communication partner  Goal status: ONGOING   ASSESSMENT:  PREVIOUS TREATMENT:     03/26/23: Pt accompanied to therapy this date by her caregiver, Agustin Cree and her sister. They brought in a notebook and completed a good portion of the personalized communication template. SLP reviewed the information with Pt and Pt asked to read names of family members. Pt able to read 75% of the names and benefited from cues to look away/provide diversion and return to task when she could not read  it right away. Family provided education on ways to help her with this at home. She was able to write the letters with her finger on the table 90% of the time and even orally spell when unable to say the word. Pt completed automatic speech tasks with 80% acc, but needed cues to decrease rate to increase accuracy of responses. Goals and evaluation were reviewed. Pt smiling and engaged throughout the session. She occasionally stated, "I can't do that", but with encouragement she tried to complete the request. Her sister commented that she seems to be able to communicate with some family members better than others and SLP suggested that she ascertain the content of conversation (ie. Harder to talk about personal needs with sister than lighter conversation with an aunt). Target above goals next session.   04/03/2023: Pt accompanied by caregiver, Agustin Cree and brought in her notebook. Pt completed confrontation naming task with 50% acc without cues and increased to 90% acc with cues (Pt able to use letter board to spell the word 90% of the time). She completed single word sentence completion (SWSC) task by providing accurate verbal response when looking at pictures with 80% acc with mi/mod cues. She was then asked to repeat the word x3 and SLP interjected with confounds at times and Pt asked to return to word. This was very challenging for Pt with performance improved throughout trials. She was able to provide a gesture for each work with 100% acc. She answered responsive naming questions with use of personal communication template as needed with 95% acc. SLP provided strategies for accurate number production by having Pt count on her fingers and show the number on her hands and then verbalize the number, which appeared effective. Introduce the "Say it" set list of words next session.   CURRENT TREATMENT:   Pt accompanied by her caregiver, Darlene. Pt indicated that she went to the ED yesterday due to chest pain and  nose bleed. She currently feels much better. She completed confrontation naming of high frequency words with 50% acc, single word sentence completion task with 100% acc with min cues, responsive naming with 90% and mod cues, and elicited appropriate gesture for words with 100% acc with min assist. She was cued to use her personalized communication template to help find answers to responsive naming tasks. During confrontation naming tasks, she benefited from being able to write the word, but had difficulty saying the word without phonemic and sentence cues. New words were added to her communication template (purse, keys, phone, water, etc). Continue to target goals above and focus on responsive naming tasks for personal information.  DATE: 04/09/23  CLINICAL IMPRESSION: (from initial evaluation on 03/19/2023) Patient is a 57 y.o. female who was seen today for speech/language evaluation s/p CVA in May of 2021 with only limited follow up SLP therapy via home health in 2021 when she was staying with her daughter. [name] sister accompanied her to the evaluation and would like for her sister to be able to communicate her wants and needs verbally. 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 Pt has difficulty verbally labeling high frequency objects (but was able to write some responses), repeating longer words/phrases, and reading phrases. She was able to match 5/5 high frequency objects to single words, but was only able to orally read/label 1/5 words. Pt benefited from  gesturing to communicate her intent and this occasionally helped elicit word production and she also benefited from SLP phonemic cues and single word sentence completion cues. Recommend SLP therapy to address communication deficits and increase independence with communication 2x/week for 4-8 weeks.  OBJECTIVE IMPAIRMENTS: include executive functioning, expressive language, receptive language, aphasia, and apraxia. These impairments are limiting patient from managing medications, managing appointments, managing finances, and effectively communicating at home and in community. Factors affecting potential to achieve goals and functional outcome are ability to learn/carryover information, severity of impairments, and time post onset of aphasia (2021) . Patient will benefit from skilled SLP services to address above impairments and improve overall function.  REHAB POTENTIAL: Good  PLAN:  SLP FREQUENCY: 2x/week  SLP DURATION: 8 weeks  PLANNED INTERVENTIONS: Cueing hierachy, Internal/external aids, Functional tasks, Multimodal communication approach, SLP instruction and feedback, Compensatory strategies, Patient/family education, and Re-evaluation   Thank you,  Havery Moros, CCC-SLP [phone]  Manpreet Strey, CCC-SLP 04/09/2023, 9:58 AM

## 2023-04-10 ENCOUNTER — Encounter (HOSPITAL_COMMUNITY): Payer: 59 | Admitting: Speech Pathology

## 2023-04-14 ENCOUNTER — Encounter (HOSPITAL_COMMUNITY): Payer: Self-pay | Admitting: Speech Pathology

## 2023-04-14 ENCOUNTER — Ambulatory Visit (HOSPITAL_COMMUNITY): Payer: 59 | Admitting: Speech Pathology

## 2023-04-14 DIAGNOSIS — I69890 Apraxia following other cerebrovascular disease: Secondary | ICD-10-CM

## 2023-04-14 DIAGNOSIS — R4701 Aphasia: Secondary | ICD-10-CM

## 2023-04-14 DIAGNOSIS — R41841 Cognitive communication deficit: Secondary | ICD-10-CM

## 2023-04-14 NOTE — Therapy (Signed)
OUTPATIENT SPEECH LANGUAGE PATHOLOGY TREATMENT   Patient Name: Robin [name] Mow MRN: [id number] DOB:07/26/1966, 57 y.o., female Today's Date: 04/14/2023  PCP: Jarold Motto, PA REFERRING PROVIDER: Jarold Motto, PA  END OF SESSION:  End of Session - 04/14/23 1342     Visit Number 5    Number of Visits 9    Date for SLP Re-Evaluation 04/22/23    Authorization Type [employer] Medicare and Medicaid secondary   eff 08/05/22 ded 240 met oop 8850 met 2034.20 limit-no auth-no co ins-20%   SLP Start Time 0950    SLP Stop Time  1035    SLP Time Calculation (min) 45 min    Activity Tolerance Patient tolerated treatment well             Past Medical History:  Diagnosis Date   A-fib (HCC)    Abdominal aortic aneurysm (AAA) (HCC)    Allergy    Anemia    Arthritis    Atrial fibrillation (HCC)    CHF (congestive heart failure) (HCC)    a. EF at 30-35% by echo in 08/2018 b. EF at 35% by repeat echo in 05/2019 c. EF improved to 65-70% in 2021 d. EF at 40-45% in 08/2021.   Chronic abdominal pain    Chronic atrial fibrillation (HCC)    Cocaine abuse (HCC)    COPD (chronic obstructive pulmonary disease) (HCC)    Diabetes mellitus without complication (HCC)    Essential hypertension, benign    Expressive aphasia    Expressive aphasia    post CVA   Fatty liver    GERD (gastroesophageal reflux disease)    Gout 2016   Normal coronary arteries    3/10 - following abnormal Myoview   Ovarian cyst    Stroke (HCC) 12/26/2019   Right sided weakness, and expressive aphasia   Stroke [facility])    Thoracic ascending aortic aneurysm (HCC)    4 cm 10/31/19 CTA   Type 2 diabetes mellitus (HCC)    type II   Past Surgical History:  Procedure Laterality Date   ABDOMINAL HYSTERECTOMY  09/10/2011   Procedure: HYSTERECTOMY ABDOMINAL;  Surgeon: Tilda Burrow, MD;  Location: [facility];  Service: Gynecology;  Laterality: N/A;  Abdominal hysterectomy   CESAREAN SECTION  [phone], and 1994   CHOLECYSTECTOMY   1995   IR 3D INDEPENDENT WKST  03/16/2020   IR ANGIO INTRA EXTRACRAN SEL INTERNAL CAROTID BILAT MOD SED  03/16/2020   IR ANGIO VERTEBRAL SEL SUBCLAVIAN INNOMINATE UNI L MOD SED  03/16/2020   IR ANGIO VERTEBRAL SEL VERTEBRAL UNI R MOD SED  03/16/2020   IR CT HEAD LTD  12/27/2019   IR PERCUTANEOUS ART THROMBECTOMY/INFUSION INTRACRANIAL INC DIAG ANGIO  12/27/2019   IR US GUIDE VASC ACCESS RIGHT  12/27/2019   IR US GUIDE VASC ACCESS RIGHT  03/16/2020   RADIOLOGY WITH ANESTHESIA N/A 12/27/2019   Procedure: IR WITH ANESTHESIA;  Surgeon: Julieanne Cotton, MD;  Location: [facility];  Service: Radiology;  Laterality: N/A;   SCAR REVISION  09/10/2011   Procedure: SCAR REVISION;  Surgeon: Tilda Burrow, MD;  Location: [facility];  Service: Gynecology;  Laterality: N/A;  Wide Excision of old Cicatrix   TUBAL LIGATION  1994   Patient Active Problem List   Diagnosis Date Noted   Acute on chronic systolic CHF (congestive heart failure) (HCC) 11/27/2021   Stage 3b chronic kidney disease (CKD) (HCC) 11/27/2021   Microcytic anemia 11/27/2021   Lactic acidosis 11/27/2021   Diabetes mellitus (  HCC) 03/09/2021   Type 2 diabetes mellitus with stage 3a chronic kidney disease, with long-term current use of insulin (HCC) 03/09/2021   Acute metabolic encephalopathy 12/31/2020   Hyperglycemia 12/31/2020   Prolonged QT interval 05/12/2020   History of CVA with residual deficit 05/12/2020   Atrial fibrillation, chronic (HCC) 05/12/2020   Right hemiparesis (HCC)    Essential hypertension    Chronic diastolic congestive heart failure (HCC)    Dysphagia due to recent stroke 01/05/2020   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 01/05/2020   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/p clot retrieval 12/27/2019   Thoracic aortic aneurysm without rupture (HCC)    Atrial fibrillation with RVR (HCC) 05/10/2019   Type 2 diabetes mellitus with diabetic autonomic neuropathy, with long-term current use of insulin (HCC) 09/01/2018    Cocaine abuse (HCC) 08/25/2018   Cigarette nicotine dependence, uncomplicated 06/07/2015   GERD (gastroesophageal reflux disease) 06/07/2011   COPD (chronic obstructive pulmonary disease) (HCC) 06/07/2011   Arthritis 06/07/2011    ONSET DATE: May 2021   REFERRING DIAG: R47.01 (ICD-10-CM) - Expressive aphasia  THERAPY DIAG:  Aphasia  Apraxia following other cerebrovascular disease  Cognitive communication deficit  Rationale for Evaluation and Treatment: Rehabilitation  SUBJECTIVE:   SUBJECTIVE STATEMENT: "We love that!"  Pt accompanied by: self and family member  PERTINENT HISTORY: Robin [name] is a pleasant 57 y.o. female with medical history significant for atrial fibrillation on Eliquis, chronic systolic CHF, CKD 3B, insulin-dependent diabetes mellitus, polysubstance abuse, COPD, GERD, HTN, and history of CVA with residual right-sided weakness and aphasia (L MCA CVA and L ICA occlusion s/p thrombectomy on 12/27/2019 followed by CIR stay where she received ST/OT/PT until d/c home on 02/02/20 and then received some [facility] SLP services). She is referred for outpatient SLP therapy by Jarold Motto, PA to facilitate increased independence with communication.  PAIN:  Are you having pain? No  PATIENT GOALS: Increase ability to talk  OBJECTIVE:   DIAGNOSTIC FINDINGS:  MRI 11/13/2020: IMPRESSION: No evidence of acute intracranial abnormality, including acute infarction.   Redemonstrated large chronic left MCA territory infarct.   Redemonstrated small chronic cortically based infarct within the posterior right temporal lobe.   Mild bilateral ethmoid sinus mucosal thickening.  PATIENT EDUCATION: Education details: Practice the "Say it, Write it, Read it" HEP provided with pictures [place] educated: Patient and sister, [name] Education method: Explanation and Handouts Education comprehension: verbalized understanding and needs further education   GOALS: Goals reviewed with  patient? Yes  SHORT TERM GOALS: Target date: 04/22/2023  Pt will increase verbal naming of common objects/pictures to 75% acc when provided with mod multimodality cues Baseline: 80% when allowed to write response or provided mod/max cues Goal status: ONGOING  2.  Pt will describe objects and pictures by providing at least three salient features (single words ok) as judged by clinician with 80% acc when provided mod cues. Baseline: 70% mod/max cues Goal status: ONGOING  3.  Pt will complete single word sentence completion tasks [facility]) with 90% acc when provided with min/mod multimodality cues.  Baseline: 75% mod cues of initial phoneme modeled Goal status: ONGOING  4.  Pt will complete basic level reading comprehension tasks (picture cues for sentence level) with 80% acc with provided mi/mod cues. Baseline: 100% word to picture matching f=5, 0/5 sentences Goal status: ONGOING  5.  Pt will verbally describe action photos with use of "who" "what doing" template with 80% acc and min/mod cues.  Baseline: 70% mod cues. Goal  status: ONGOING  6.  Pt will complete personally relevant responsive naming tasks with 90% acc with use of written cues as needed. Baseline: 75% acc Goal status: ONGOING  LONG TERM GOALS: Target date: 05/25/2023  Pt will communicate moderately complex wants/needs to [facility] with min assist from communication partner and use of multimodality communication strategies. Baseline: Pt requires mod assist from communication partner  Goal status: ONGOING   ASSESSMENT:  PREVIOUS TREATMENT:     03/26/23: Pt accompanied to therapy this date by her caregiver, Agustin Cree and her sister. They brought in a notebook and completed a good portion of the personalized communication template. SLP reviewed the information with Pt and Pt asked to read names of family members. Pt able to read 75% of the names and benefited from cues to look away/provide diversion and return to task when she could  not read it right away. Family provided education on ways to help her with this at home. She was able to write the letters with her finger on the table 90% of the time and even orally spell when unable to say the word. Pt completed automatic speech tasks with 80% acc, but needed cues to decrease rate to increase accuracy of responses. Goals and evaluation were reviewed. Pt smiling and engaged throughout the session. She occasionally stated, "I can't do that", but with encouragement she tried to complete the request. Her sister commented that she seems to be able to communicate with some family members better than others and SLP suggested that she ascertain the content of conversation (ie. Harder to talk about personal needs with sister than lighter conversation with an aunt). Target above goals next session.   04/03/2023: Pt accompanied by caregiver, Agustin Cree and brought in her notebook. Pt completed confrontation naming task with 50% acc without cues and increased to 90% acc with cues (Pt able to use letter board to spell the word 90% of the time). She completed single word sentence completion (SWSC) task by providing accurate verbal response when looking at pictures with 80% acc with mi/mod cues. She was then asked to repeat the word x3 and SLP interjected with confounds at times and Pt asked to return to word. This was very challenging for Pt with performance improved throughout trials. She was able to provide a gesture for each work with 100% acc. She answered responsive naming questions with use of personal communication template as needed with 95% acc. SLP provided strategies for accurate number production by having Pt count on her fingers and show the number on her hands and then verbalize the number, which appeared effective. Introduce the "Say it" set list of words next session.   04/09/2023: Pt accompanied by her caregiver, Darlene. Pt indicated that she went to the ED yesterday due to chest pain and nose  bleed. She currently feels much better. She completed confrontation naming of high frequency words with 50% acc, single word sentence completion task with 100% acc with min cues, responsive naming with 90% and mod cues, and elicited appropriate gesture for words with 100% acc with min assist. She was cued to use her personalized communication template to help find answers to responsive naming tasks. During confrontation naming tasks, she benefited from being able to write the word, but had difficulty saying the word without phonemic and sentence cues. New words were added to her communication template (purse, keys, phone, water, etc). Continue to target goals above and focus on responsive naming tasks for personal information.     CURRENT  TREATMENT:  Pt accompanied by her caregiver, Darlene. She verbalized that she went to [place] over the weekend to see friends/family. She completed responsive naming tasks in conversation regarding personally relevant information with 90% acc with ~75% acc for verbal responses and 100% when allowed to write or use written cues. She completed confrontation naming task when looking at icons on a tablet with 70% acc with mod cues, but was able to repeat the name of the item after model provided, with 90% acc. She was introduced to the [place] tablet today and she expressed interest in using it to help her communicate. She indicates that she thinks she has something similar at home and will bring in next session. She was able to quickly point to 7 fruits that she likes to eat and was able to repeat 6/7 after model provided. Update goals next session.                                                                                                                           DATE: 04/14/23  CLINICAL IMPRESSION: (from initial evaluation on 03/19/2023) Patient is a 57 y.o. female who was seen today for speech/language evaluation s/p CVA in May of 2021 with only limited follow up  SLP therapy via home health in 2021 when she was staying with her daughter. [name] sister accompanied her to the evaluation and would like for her sister to be able to communicate her wants and needs verbally. 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 Pt has difficulty verbally labeling high frequency objects (but was able to write some responses), repeating longer words/phrases, and reading phrases. She was able to match 5/5 high frequency objects to single words, but was only able to orally read/label 1/5 words. Pt benefited from gesturing to communicate her intent and this occasionally helped elicit word production and she also benefited from SLP phonemic cues and single word sentence completion cues. Recommend SLP therapy to address communication deficits and increase independence with communication 2x/week for 4-8 weeks.  OBJECTIVE IMPAIRMENTS: include executive functioning, expressive language, receptive language, aphasia, and apraxia. These impairments are limiting patient from managing medications, managing appointments, managing finances, and effectively communicating at home and in community. Factors affecting potential to achieve goals and functional outcome are ability to learn/carryover information, severity of impairments, and time post onset of aphasia (2021) . Patient will benefit from skilled SLP services to address above impairments and improve overall function.  REHAB POTENTIAL: Good  PLAN:  SLP FREQUENCY: 2x/week  SLP DURATION: 8 weeks  PLANNED INTERVENTIONS: Cueing hierachy, Internal/external aids, Functional tasks, Multimodal communication approach, SLP  instruction and feedback, Compensatory strategies, Patient/family education, and Re-evaluation   Thank you,  Havery Moros,  CCC-SLP [phone]  Lauren Modisette, CCC-SLP 04/14/2023, 1:44 PM

## 2023-04-15 ENCOUNTER — Encounter (HOSPITAL_COMMUNITY): Payer: 59 | Admitting: Speech Pathology

## 2023-04-16 ENCOUNTER — Other Ambulatory Visit: Payer: Self-pay | Admitting: Physician Assistant

## 2023-04-17 ENCOUNTER — Encounter (HOSPITAL_COMMUNITY): Payer: 59 | Admitting: Speech Pathology

## 2023-04-17 ENCOUNTER — Ambulatory Visit (HOSPITAL_COMMUNITY): Payer: 59 | Admitting: Speech Pathology

## 2023-04-17 ENCOUNTER — Encounter (HOSPITAL_COMMUNITY): Payer: Self-pay | Admitting: Speech Pathology

## 2023-04-17 DIAGNOSIS — R4701 Aphasia: Secondary | ICD-10-CM

## 2023-04-17 DIAGNOSIS — R41841 Cognitive communication deficit: Secondary | ICD-10-CM

## 2023-04-17 DIAGNOSIS — I69890 Apraxia following other cerebrovascular disease: Secondary | ICD-10-CM

## 2023-04-17 NOTE — Therapy (Signed)
OUTPATIENT SPEECH LANGUAGE PATHOLOGY TREATMENT   Patient Name: Robin [name] MRN: [id number] DOB:Nov 13, 1965, 57 y.o., female Today's Date: 04/17/2023  PCP: Jarold Motto, PA REFERRING PROVIDER: Jarold Motto, PA  END OF SESSION:  End of Session - 04/17/23 1319     Visit Number 6    Number of Visits 9    Date for SLP Re-Evaluation 04/22/23    Authorization Type [employer] Medicare and Medicaid secondary   eff 08/05/22 ded 240 met oop 8850 met 2034.20 limit-no auth-no co ins-20%   SLP Start Time 0945    SLP Stop Time  1030    SLP Time Calculation (min) 45 min    Activity Tolerance Patient tolerated treatment well             Past Medical History:  Diagnosis Date   A-fib (HCC)    Abdominal aortic aneurysm (AAA) (HCC)    Allergy    Anemia    Arthritis    Atrial fibrillation (HCC)    CHF (congestive heart failure) (HCC)    a. EF at 30-35% by echo in 08/2018 b. EF at 35% by repeat echo in 05/2019 c. EF improved to 65-70% in 2021 d. EF at 40-45% in 08/2021.   Chronic abdominal pain    Chronic atrial fibrillation (HCC)    Cocaine abuse (HCC)    COPD (chronic obstructive pulmonary disease) (HCC)    Diabetes mellitus without complication (HCC)    Essential hypertension, benign    Expressive aphasia    Expressive aphasia    post CVA   Fatty liver    GERD (gastroesophageal reflux disease)    Gout 2016   Normal coronary arteries    3/10 - following abnormal Myoview   Ovarian cyst    Stroke (HCC) 12/26/2019   Right sided weakness, and expressive aphasia   Stroke [facility])    Thoracic ascending aortic aneurysm (HCC)    4 cm 10/31/19 CTA   Type 2 diabetes mellitus (HCC)    type II   Past Surgical History:  Procedure Laterality Date   ABDOMINAL HYSTERECTOMY  09/10/2011   Procedure: HYSTERECTOMY ABDOMINAL;  Surgeon: Tilda Burrow, MD;  Location: [facility];  Service: Gynecology;  Laterality: N/A;  Abdominal hysterectomy   CESAREAN SECTION  [phone], and 1994   CHOLECYSTECTOMY   1995   IR 3D INDEPENDENT WKST  03/16/2020   IR ANGIO INTRA EXTRACRAN SEL INTERNAL CAROTID BILAT MOD SED  03/16/2020   IR ANGIO VERTEBRAL SEL SUBCLAVIAN INNOMINATE UNI L MOD SED  03/16/2020   IR ANGIO VERTEBRAL SEL VERTEBRAL UNI R MOD SED  03/16/2020   IR CT HEAD LTD  12/27/2019   IR PERCUTANEOUS ART THROMBECTOMY/INFUSION INTRACRANIAL INC DIAG ANGIO  12/27/2019   IR US GUIDE VASC ACCESS RIGHT  12/27/2019   IR US GUIDE VASC ACCESS RIGHT  03/16/2020   RADIOLOGY WITH ANESTHESIA N/A 12/27/2019   Procedure: IR WITH ANESTHESIA;  Surgeon: Julieanne Cotton, MD;  Location: [facility];  Service: Radiology;  Laterality: N/A;   SCAR REVISION  09/10/2011   Procedure: SCAR REVISION;  Surgeon: Tilda Burrow, MD;  Location: [facility];  Service: Gynecology;  Laterality: N/A;  Wide Excision of old Cicatrix   TUBAL LIGATION  1994   Patient Active Problem List   Diagnosis Date Noted   Acute on chronic systolic CHF (congestive heart failure) (HCC) 11/27/2021   Stage 3b chronic kidney disease (CKD) (HCC) 11/27/2021   Microcytic anemia 11/27/2021   Lactic acidosis 11/27/2021   Diabetes mellitus (  HCC) 03/09/2021   Type 2 diabetes mellitus with stage 3a chronic kidney disease, with long-term current use of insulin (HCC) 03/09/2021   Acute metabolic encephalopathy 12/31/2020   Hyperglycemia 12/31/2020   Prolonged QT interval 05/12/2020   History of CVA with residual deficit 05/12/2020   Atrial fibrillation, chronic (HCC) 05/12/2020   Right hemiparesis (HCC)    Essential hypertension    Chronic diastolic congestive heart failure (HCC)    Dysphagia due to recent stroke 01/05/2020   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 01/05/2020   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 retrieval 12/27/2019   Thoracic aortic aneurysm without rupture (HCC)    Atrial fibrillation with RVR (HCC) 05/10/2019   Type 2 diabetes mellitus with diabetic autonomic neuropathy, with long-term current use of insulin (HCC) 09/01/2018    Cocaine abuse (HCC) 08/25/2018   Cigarette nicotine dependence, uncomplicated 06/07/2015   GERD (gastroesophageal reflux disease) 06/07/2011   COPD (chronic obstructive pulmonary disease) (HCC) 06/07/2011   Arthritis 06/07/2011    ONSET DATE: May 2021   REFERRING DIAG: R47.01 (ICD-10-CM) - Expressive aphasia  THERAPY DIAG:  Aphasia  Apraxia following other cerebrovascular disease  Cognitive communication deficit  Rationale for Evaluation and Treatment: Rehabilitation  SUBJECTIVE:   SUBJECTIVE STATEMENT: "That's it!"  Pt accompanied by: self and family member  PERTINENT HISTORY: Robin [name] is a pleasant 57 y.o. female with medical history significant for atrial fibrillation on Eliquis, chronic systolic CHF, CKD 3B, insulin-dependent diabetes mellitus, polysubstance abuse, COPD, GERD, HTN, and history of CVA with residual right-sided weakness and aphasia (L MCA CVA and L ICA occlusion s/p thrombectomy on 12/27/2019 followed by CIR stay where she received ST/OT/PT until d/c home on 02/02/20 and then received some [facility] SLP services). She is referred for outpatient SLP therapy by Jarold Motto, PA to facilitate increased independence with communication.  PAIN:  Are you having pain? No  PATIENT GOALS: Increase ability to talk  OBJECTIVE:   DIAGNOSTIC FINDINGS:  MRI 11/13/2020: IMPRESSION: No evidence of acute intracranial abnormality, including acute infarction.   Redemonstrated large chronic left MCA territory infarct.   Redemonstrated small chronic cortically based infarct within the posterior right temporal lobe.   Mild bilateral ethmoid sinus mucosal thickening.  PATIENT EDUCATION: Education details: Practice the "Say it, Write it, Read it" HEP provided with pictures [place] educated: Patient and sister, Robin [name] Education method: Explanation and Handouts Education comprehension: verbalized understanding and needs further education   GOALS: Goals reviewed with  patient? Yes  SHORT TERM GOALS: Target date: 04/22/2023  Pt will increase verbal naming of common objects/pictures to 75% acc when provided with mod multimodality cues Baseline: 80% when allowed to write response or provided mod/max cues Goal status: ONGOING  2.  Pt will describe objects and pictures by providing at least three salient features (single words ok) as judged by clinician with 80% acc when provided mod cues. Baseline: 70% mod/max cues Goal status: ONGOING  3.  Pt will complete single word sentence completion tasks [facility]) with 90% acc when provided with min/mod multimodality cues.  Baseline: 75% mod cues of initial phoneme modeled Goal status: ONGOING  4.  Pt will complete basic level reading comprehension tasks (picture cues for sentence level) with 80% acc with provided mi/mod cues. Baseline: 100% word to picture matching f=5, 0/5 sentences Goal status: ONGOING  5.  Pt will verbally describe action photos with use of "who" "what doing" template with 80% acc and min/mod cues.  Baseline: 70% mod cues. Goal status:  ONGOING  6.  Pt will complete personally relevant responsive naming tasks with 90% acc with use of written cues as needed. Baseline: 75% acc Goal status: ONGOING  LONG TERM GOALS: Target date: 05/25/2023  Pt will communicate moderately complex wants/needs to [facility] with min assist from communication partner and use of multimodality communication strategies. Baseline: Pt requires mod assist from communication partner  Goal status: ONGOING   ASSESSMENT:  PREVIOUS TREATMENT:     03/26/23: Pt accompanied to therapy this date by her caregiver, Robin [name] and her sister. They brought in a notebook and completed a good portion of the personalized communication template. SLP reviewed the information with Pt and Pt asked to read names of family members. Pt able to read 75% of the names and benefited from cues to look away/provide diversion and return to task when she could  not read it right away. Family provided education on ways to help her with this at home. She was able to write the letters with her finger on the table 90% of the time and even orally spell when unable to say the word. Pt completed automatic speech tasks with 80% acc, but needed cues to decrease rate to increase accuracy of responses. Goals and evaluation were reviewed. Pt smiling and engaged throughout the session. She occasionally stated, "I can't do that", but with encouragement she tried to complete the request. Her sister commented that she seems to be able to communicate with some family members better than others and SLP suggested that she ascertain the content of conversation (ie. Harder to talk about personal needs with sister than lighter conversation with an aunt). Target above goals next session.   04/03/2023: Pt accompanied by caregiver, Robin [name] and brought in her notebook. Pt completed confrontation naming task with 50% acc without cues and increased to 90% acc with cues (Pt able to use letter board to spell the word 90% of the time). She completed single word sentence completion (SWSC) task by providing accurate verbal response when looking at pictures with 80% acc with mi/mod cues. She was then asked to repeat the word x3 and SLP interjected with confounds at times and Pt asked to return to word. This was very challenging for Pt with performance improved throughout trials. She was able to provide a gesture for each work with 100% acc. She answered responsive naming questions with use of personal communication template as needed with 95% acc. SLP provided strategies for accurate number production by having Pt count on her fingers and show the number on her hands and then verbalize the number, which appeared effective. Introduce the "Say it" set list of words next session.   04/09/2023: Pt accompanied by her caregiver, Robin [name]. Pt indicated that she went to the ED yesterday due to chest pain and nose  bleed. She currently feels much better. She completed confrontation naming of high frequency words with 50% acc, single word sentence completion task with 100% acc with min cues, responsive naming with 90% and mod cues, and elicited appropriate gesture for words with 100% acc with min assist. She was cued to use her personalized communication template to help find answers to responsive naming tasks. During confrontation naming tasks, she benefited from being able to write the word, but had difficulty saying the word without phonemic and sentence cues. New words were added to her communication template (purse, keys, phone, water, etc). Continue to target goals above and focus on responsive naming tasks for personal information.     04/14/2023: Pt  accompanied by her caregiver, Robin [name]. She verbalized that she went to [place] over the weekend to see friends/family. She completed responsive naming tasks in conversation regarding personally relevant information with 90% acc with ~75% acc for verbal responses and 100% when allowed to write or use written cues. She completed confrontation naming task when looking at icons on a tablet with 70% acc with mod cues, but was able to repeat the name of the item after model provided, with 90% acc. She was introduced to the [place] tablet today and she expressed interest in using it to help her communicate. She indicates that she thinks she has something similar at home and will bring in next session. She was able to quickly point to 7 fruits that she likes to eat and was able to repeat 6/7 after model provided. Update goals next session.       CURRENT TREATMENT:  Pt was accompanied by a new caregiver today because Robin [name] had an appointment. Pt was able to communicate this to SLP with use of written personalized template to identify the name "Robin [name]". Robin [name] answered personally relevant responsive naming questions (regarding names of friends, family, and medical  professionals) with 100% acc with use of communication support as needed and accurate verbal response 90% of the time with min cues. Pt verbalized a subject, verb/action, and object when looking at action photos with 92% acc with mi/mod cues and was then able to repeat with written model provided with 100% acc with min assist. She then completed single word sentence completion task with 100% acc with min cues. Robin [name] occasionally shuts down when a task feels out of her reach or too challenging and needs encouragement to try the task. She was trying to verbalize "smelling" and produced "smoking" instead, but was able to break the perseveration and produce "smelling" on ~70% of cued trials. She was given divergent naming task with word bank provided for homework and completed trial x 2 before ending the session. Next session, continue to expansive naming tasks to generate sentences when looking at photos.                                                                                                                     DATE: 04/17/23  CLINICAL IMPRESSION: (from initial evaluation on 03/19/2023) Patient is a 57 y.o. female who was seen today for speech/language evaluation s/p CVA in May of 2021 with only limited follow up SLP therapy via home health in 2021 when she was staying with her daughter. [name] sister accompanied her to the evaluation and would like for her sister to be able to communicate her wants and needs verbally. 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 Pt has difficulty verbally labeling high frequency  objects (but was able to write some responses), repeating longer  words/phrases, and reading phrases. She was able to match 5/5 high frequency objects to single words, but was only able to orally read/label 1/5 words. Pt benefited from gesturing to communicate her intent and this occasionally helped elicit word production and she also benefited from SLP phonemic cues and single word sentence completion cues. Recommend SLP therapy to address communication deficits and increase independence with communication 2x/week for 4-8 weeks.  OBJECTIVE IMPAIRMENTS: include executive functioning, expressive language, receptive language, aphasia, and apraxia. These impairments are limiting patient from managing medications, managing appointments, managing finances, and effectively communicating at home and in community. Factors affecting potential to achieve goals and functional outcome are ability to learn/carryover information, severity of impairments, and time post onset of aphasia (2021) . Patient will benefit from skilled SLP services to address above impairments and improve overall function.  REHAB POTENTIAL: Good  PLAN:  SLP FREQUENCY: 2x/week  SLP DURATION: 8 weeks  PLANNED INTERVENTIONS: Cueing hierachy, Internal/external aids, Functional tasks, Multimodal communication approach, SLP instruction and feedback, Compensatory strategies, Patient/family education, and Re-evaluation   Thank you,  Robin [name], CCC-SLP [phone]  Robin [name], CCC-SLP 04/17/2023, 1:20 PM

## 2023-04-22 ENCOUNTER — Encounter (HOSPITAL_COMMUNITY): Payer: 59 | Admitting: Speech Pathology

## 2023-04-23 ENCOUNTER — Encounter (HOSPITAL_COMMUNITY): Payer: Self-pay | Admitting: Speech Pathology

## 2023-04-23 ENCOUNTER — Ambulatory Visit (HOSPITAL_COMMUNITY): Payer: 59 | Admitting: Speech Pathology

## 2023-04-23 DIAGNOSIS — R4701 Aphasia: Secondary | ICD-10-CM

## 2023-04-23 DIAGNOSIS — I69890 Apraxia following other cerebrovascular disease: Secondary | ICD-10-CM

## 2023-04-23 DIAGNOSIS — R41841 Cognitive communication deficit: Secondary | ICD-10-CM

## 2023-04-23 NOTE — Therapy (Signed)
OUTPATIENT SPEECH LANGUAGE PATHOLOGY TREATMENT   Patient Name: Robin [name] MRN: [id number] DOB:25-Jun-1966, 57 y.o., female Today's Date: 04/23/2023  PCP: Jarold Motto, PA REFERRING PROVIDER: Jarold Motto, PA  END OF SESSION:  End of Session - 04/23/23 0943     Number of Visits 9    Date for SLP Re-Evaluation 04/25/23    Authorization Type UHC Medicare and Medicaid secondary   eff 08/05/22 ded 240 met oop 8850 met 2034.20 limit-no auth-no co ins-20%   SLP Start Time 0935    SLP Stop Time  1015    SLP Time Calculation (min) 40 min    Activity Tolerance Patient tolerated treatment well             Past Medical History:  Diagnosis Date   A-fib (HCC)    Abdominal aortic aneurysm (AAA) (HCC)    Allergy    Anemia    Arthritis    Atrial fibrillation (HCC)    CHF (congestive heart failure) (HCC)    a. EF at 30-35% by echo in 08/2018 b. EF at 35% by repeat echo in 05/2019 c. EF improved to 65-70% in 2021 d. EF at 40-45% in 08/2021.   Chronic abdominal pain    Chronic atrial fibrillation (HCC)    Cocaine abuse (HCC)    COPD (chronic obstructive pulmonary disease) (HCC)    Diabetes mellitus without complication (HCC)    Essential hypertension, benign    Expressive aphasia    Expressive aphasia    post CVA   Fatty liver    GERD (gastroesophageal reflux disease)    Gout 2016   Normal coronary arteries    3/10 - following abnormal Myoview   Ovarian cyst    Stroke (HCC) 12/26/2019   Right sided weakness, and expressive aphasia   Stroke [facility])    Thoracic ascending aortic aneurysm (HCC)    4 cm 10/31/19 CTA   Type 2 diabetes mellitus (HCC)    type II   Past Surgical History:  Procedure Laterality Date   ABDOMINAL HYSTERECTOMY  09/10/2011   Procedure: HYSTERECTOMY ABDOMINAL;  Surgeon: Tilda Burrow, MD;  Location: [facility];  Service: Gynecology;  Laterality: N/A;  Abdominal hysterectomy   CESAREAN SECTION  [phone], and 1994   CHOLECYSTECTOMY  1995   IR 3D  INDEPENDENT WKST  03/16/2020   IR ANGIO INTRA EXTRACRAN SEL INTERNAL CAROTID BILAT MOD SED  03/16/2020   IR ANGIO VERTEBRAL SEL SUBCLAVIAN INNOMINATE UNI L MOD SED  03/16/2020   IR ANGIO VERTEBRAL SEL VERTEBRAL UNI R MOD SED  03/16/2020   IR CT HEAD LTD  12/27/2019   IR PERCUTANEOUS ART THROMBECTOMY/INFUSION INTRACRANIAL INC DIAG ANGIO  12/27/2019   IR US GUIDE VASC ACCESS RIGHT  12/27/2019   IR US GUIDE VASC ACCESS RIGHT  03/16/2020   RADIOLOGY WITH ANESTHESIA N/A 12/27/2019   Procedure: IR WITH ANESTHESIA;  Surgeon: Julieanne Cotton, MD;  Location: [facility];  Service: Radiology;  Laterality: N/A;   SCAR REVISION  09/10/2011   Procedure: SCAR REVISION;  Surgeon: Tilda Burrow, MD;  Location: [facility];  Service: Gynecology;  Laterality: N/A;  Wide Excision of old Cicatrix   TUBAL LIGATION  1994   Patient Active Problem List   Diagnosis Date Noted   Acute on chronic systolic CHF (congestive heart failure) (HCC) 11/27/2021   Stage 3b chronic kidney disease (CKD) (HCC) 11/27/2021   Microcytic anemia 11/27/2021   Lactic acidosis 11/27/2021   Diabetes mellitus (HCC) 03/09/2021   Type 2  diabetes mellitus with stage 3a chronic kidney disease, with long-term current use of insulin (HCC) 03/09/2021   Acute metabolic encephalopathy 12/31/2020   Hyperglycemia 12/31/2020   Prolonged QT interval 05/12/2020   History of CVA with residual deficit 05/12/2020   Atrial fibrillation, chronic (HCC) 05/12/2020   Right hemiparesis (HCC)    Essential hypertension    Chronic diastolic congestive heart failure (HCC)    Dysphagia due to recent stroke 01/05/2020   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 01/05/2020   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 retrieval 12/27/2019   Thoracic aortic aneurysm without rupture (HCC)    Atrial fibrillation with RVR (HCC) 05/10/2019   Type 2 diabetes mellitus with diabetic autonomic neuropathy, with long-term current use of insulin (HCC) 09/01/2018   Cocaine abuse  (HCC) 08/25/2018   Cigarette nicotine dependence, uncomplicated 06/07/2015   GERD (gastroesophageal reflux disease) 06/07/2011   COPD (chronic obstructive pulmonary disease) (HCC) 06/07/2011   Arthritis 06/07/2011    ONSET DATE: May 2021   REFERRING DIAG: R47.01 (ICD-10-CM) - Expressive aphasia  THERAPY DIAG:  Aphasia  Apraxia following other cerebrovascular disease  Cognitive communication deficit  Rationale for Evaluation and Treatment: Rehabilitation  SUBJECTIVE:   SUBJECTIVE STATEMENT: "You went to the doctor?"  Pt accompanied by: self and family member  PERTINENT HISTORY: Robin [name] is a pleasant 57 y.o. female with medical history significant for atrial fibrillation on Eliquis, chronic systolic CHF, CKD 3B, insulin-dependent diabetes mellitus, polysubstance abuse, COPD, GERD, HTN, and history of CVA with residual right-sided weakness and aphasia (L MCA CVA and L ICA occlusion s/p thrombectomy on 12/27/2019 followed by CIR stay where she received ST/OT/PT until d/c home on 02/02/20 and then received some [facility] SLP services). She is referred for outpatient SLP therapy by Jarold Motto, PA to facilitate increased independence with communication.  PAIN:  Are you having pain? No  PATIENT GOALS: Increase ability to talk  OBJECTIVE:   DIAGNOSTIC FINDINGS:  MRI 11/13/2020: IMPRESSION: No evidence of acute intracranial abnormality, including acute infarction.   Redemonstrated large chronic left MCA territory infarct.   Redemonstrated small chronic cortically based infarct within the posterior right temporal lobe.   Mild bilateral ethmoid sinus mucosal thickening.  PATIENT EDUCATION: Education details: Practice the "Say it, Write it, Read it" HEP provided with pictures [place] educated: Patient and sister, Robin [name] Education method: Explanation and Handouts Education comprehension: verbalized understanding and needs further education   GOALS: Goals reviewed with patient?  Yes  SHORT TERM GOALS: Target date: 04/25/2023  Pt will increase verbal naming of common objects/pictures to 75% acc when provided with mod multimodality cues Baseline: 80% when allowed to write response or provided mod/max cues Goal status: MET for 100% acc for mod/max cues (phonemic cue provided); change to 100% acc with mod cues.  2.  Pt will describe objects and pictures by providing at least three salient features (single words ok) as judged by clinician with 80% acc when provided mod cues. Baseline: 70% mod/max cues Goal status: MET for 100% with mod/max cues provided; change to 100% acc with mod cues.  3.  Pt will complete single word sentence completion tasks [facility]) with 90% acc when provided with min/mod multimodality cues.  Baseline: 75% mod cues of initial phoneme modeled Goal status: MET for 100% acc with mi/mod cues provided; change to 100% with min cues.  4.  Pt will complete basic level reading comprehension tasks (picture cues for sentence level) with 80% acc with provided mi/mod cues. Baseline: 100%  word to picture matching f=5, 0/5 sentences Goal status: MET for single words, continue for sentences  5.  Pt will verbally describe action photos with use of "who" "what doing" template with 80% acc and min/mod cues.  Baseline: 70% mod cues. Goal status: MET for 100% acc with mod/max cues; change to 100% with mod cues.  6.  Pt will complete personally relevant responsive naming tasks with 90% acc with use of written cues as needed. Baseline: 75% acc Goal status: MET  7. Pt will use a communication device to cue themselves to speak (reading or imitating words) during 1:1 conversations with SLP with min/mod cues over 3 consecutive sessions.  Baseline: Max assist  Goal status: NEW  8. Pt will respond to personal preference questions by answering with alternative communication device with 90% acc and min assist.  Baseline: Max assist  Goal status: NEW  LONG TERM GOALS:  Target date: 05/25/2023  Pt will communicate moderately complex wants/needs to [facility] with min assist from communication partner and use of multimodality communication strategies. Baseline: Pt requires mod assist from communication partner  Goal status: ONGOING   ASSESSMENT:  PREVIOUS TREATMENT:     03/26/23: Pt accompanied to therapy this date by her caregiver, Robin [name] and her sister. They brought in a notebook and completed a good portion of the personalized communication template. SLP reviewed the information with Pt and Pt asked to read names of family members. Pt able to read 75% of the names and benefited from cues to look away/provide diversion and return to task when she could not read it right away. Family provided education on ways to help her with this at home. She was able to write the letters with her finger on the table 90% of the time and even orally spell when unable to say the word. Pt completed automatic speech tasks with 80% acc, but needed cues to decrease rate to increase accuracy of responses. Goals and evaluation were reviewed. Pt smiling and engaged throughout the session. She occasionally stated, "I can't do that", but with encouragement she tried to complete the request. Her sister commented that she seems to be able to communicate with some family members better than others and SLP suggested that she ascertain the content of conversation (ie. Harder to talk about personal needs with sister than lighter conversation with an aunt). Target above goals next session.   04/03/2023: Pt accompanied by caregiver, Robin [name] and brought in her notebook. Pt completed confrontation naming task with 50% acc without cues and increased to 90% acc with cues (Pt able to use letter board to spell the word 90% of the time). She completed single word sentence completion (SWSC) task by providing accurate verbal response when looking at pictures with 80% acc with mi/mod cues. She was then asked to repeat  the word x3 and SLP interjected with confounds at times and Pt asked to return to word. This was very challenging for Pt with performance improved throughout trials. She was able to provide a gesture for each work with 100% acc. She answered responsive naming questions with use of personal communication template as needed with 95% acc. SLP provided strategies for accurate number production by having Pt count on her fingers and show the number on her hands and then verbalize the number, which appeared effective. Introduce the "Say it" set list of words next session.   04/09/2023: Pt accompanied by her caregiver, Robin [name]. Pt indicated that she went to the ED yesterday due to chest pain  and nose bleed. She currently feels much better. She completed confrontation naming of high frequency words with 50% acc, single word sentence completion task with 100% acc with min cues, responsive naming with 90% and mod cues, and elicited appropriate gesture for words with 100% acc with min assist. She was cued to use her personalized communication template to help find answers to responsive naming tasks. During confrontation naming tasks, she benefited from being able to write the word, but had difficulty saying the word without phonemic and sentence cues. New words were added to her communication template (purse, keys, phone, water, etc). Continue to target goals above and focus on responsive naming tasks for personal information.     04/14/2023: Pt accompanied by her caregiver, Robin [name]. She verbalized that she went to [place] over the weekend to see friends/family. She completed responsive naming tasks in conversation regarding personally relevant information with 90% acc with ~75% acc for verbal responses and 100% when allowed to write or use written cues. She completed confrontation naming task when looking at icons on a tablet with 70% acc with mod cues, but was able to repeat the name of the item after model provided, with  90% acc. She was introduced to the [place] tablet today and she expressed interest in using it to help her communicate. She indicates that she thinks she has something similar at home and will bring in next session. She was able to quickly point to 7 fruits that she likes to eat and was able to repeat 6/7 after model provided. Update goals next session.       04/17/23:  Pt was accompanied by a new caregiver today because Robin [name] had an appointment. Pt was able to communicate this to SLP with use of written personalized template to identify the name "Robin [name]". Robin [name] answered personally relevant responsive naming questions (regarding names of friends, family, and medical professionals) with 100% acc with use of communication support as needed and accurate verbal response 90% of the time with min cues. Pt verbalized a subject, verb/action, and object when looking at action photos with 92% acc with mi/mod cues and was then able to repeat with written model provided with 100% acc with min assist. She then completed single word sentence completion task with 100% acc with min cues. Robin [name] occasionally shuts down when a task feels out of her reach or too challenging and needs encouragement to try the task. She was trying to verbalize "smelling" and produced "smoking" instead, but was able to break the perseveration and produce "smelling" on ~70% of cued trials. She was given divergent naming task with word bank provided for homework and completed trial x 2 before ending the session. Next session, continue to expansive naming tasks to generate sentences when looking at photos.   CURRENT TREATMENT: Pt accompanied to therapy by Robin [name]. She completed responsive naming task by answering personally relevant questions with use of written cues provided with 100% acc. She is unable to verbalize responses independently without use of written cues or initial phoneme cue from SLP. She named common objects with 100% acc when  allowed to write her response or when given initial phoneme cue and she completed single word sentence completion tasks with 100% acc with provided mi/mod cues. She independently uses total communication strategies by gesturing, writing, or looking for written response when she is unable to verbalize her response. Unfortunately, she needs environmental supports via communication partner, written templates, gestures, and ability to hand write responses to communicate her thoughts.  Pt would make an excellent candidate for a dynamic communication system. We have tried some apps on her phone, however she would benefit from a dedicated speech generating device. Goals have been updated and will request a trial of a communication device. Pt is in agreement with plan of care. Requesting 2x/week for 4 more weeks.                                                                                                                     DATE: 04/23/23  CLINICAL IMPRESSION: (from initial evaluation on 03/19/2023) Patient is a 57 y.o. female who was seen today for speech/language evaluation s/p CVA in May of 2021 with only limited follow up SLP therapy via home health in 2021 when she was staying with her daughter. [name] sister accompanied her to the evaluation and would like for her sister to be able to communicate her wants and needs verbally. 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 Pt has difficulty verbally labeling high frequency objects (but was able to write some responses), repeating longer words/phrases, and reading phrases. She was able to match 5/5 high frequency objects to single words, but  was only able to orally read/label 1/5 words. Pt benefited from gesturing to communicate her intent and this occasionally helped elicit word production and she also benefited from SLP phonemic cues and single word sentence completion cues. Recommend SLP therapy to address communication deficits and increase independence with communication 2x/week for 4-8 weeks.  OBJECTIVE IMPAIRMENTS: include executive functioning, expressive language, receptive language, aphasia, and apraxia. These impairments are limiting patient from managing medications, managing appointments, managing finances, and effectively communicating at home and in community. Factors affecting potential to achieve goals and functional outcome are ability to learn/carryover information, severity of impairments, and time post onset of aphasia (2021) . Patient will benefit from skilled SLP services to address above impairments and improve overall function.  REHAB POTENTIAL: Good  PLAN:  SLP FREQUENCY: 2x/week  SLP DURATION: 8 weeks  PLANNED INTERVENTIONS: Cueing hierachy, Internal/external aids, Functional tasks, Multimodal communication approach, SLP instruction and feedback, Compensatory strategies, Patient/family education, and Re-evaluation   Speech Therapy Progress Note  Dates of Reporting Period: 03/19/23 to 04/25/23  Objective Reports of Subjective Statement: Pt has made good progress toward goals, see above  Objective Measurements: See above  Goal Update: New Goals above  Plan: 2x/week for 4 weeks and consider alternative communication device (dynamic computerized system)  Reason Skilled Services are Required: Good progress toward goals and Pt in need of a dynamic speech generating device, adjust goals, and ongoing Pt and caregiver education on ways to facilitate communication.    Thank you,  Havery Moros, CCC-SLP [phone]  Gennett Garcia, CCC-SLP 04/23/2023, 1:04 PM

## 2023-04-28 ENCOUNTER — Ambulatory Visit: Payer: 59 | Attending: Student | Admitting: Medical

## 2023-04-28 ENCOUNTER — Encounter: Payer: Self-pay | Admitting: Medical

## 2023-04-28 ENCOUNTER — Encounter (HOSPITAL_COMMUNITY): Payer: Self-pay | Admitting: Speech Pathology

## 2023-04-28 ENCOUNTER — Ambulatory Visit (HOSPITAL_COMMUNITY): Payer: 59 | Admitting: Speech Pathology

## 2023-04-28 ENCOUNTER — Encounter: Payer: Self-pay | Admitting: *Deleted

## 2023-04-28 ENCOUNTER — Encounter: Payer: Self-pay | Admitting: Physician Assistant

## 2023-04-28 VITALS — BP 126/72 | HR 84 | Ht 67.0 in | Wt 237.4 lb

## 2023-04-28 DIAGNOSIS — R079 Chest pain, unspecified: Secondary | ICD-10-CM | POA: Diagnosis not present

## 2023-04-28 DIAGNOSIS — I4891 Unspecified atrial fibrillation: Secondary | ICD-10-CM | POA: Diagnosis not present

## 2023-04-28 DIAGNOSIS — I10 Essential (primary) hypertension: Secondary | ICD-10-CM | POA: Diagnosis not present

## 2023-04-28 DIAGNOSIS — R4701 Aphasia: Secondary | ICD-10-CM | POA: Diagnosis not present

## 2023-04-28 DIAGNOSIS — R41841 Cognitive communication deficit: Secondary | ICD-10-CM

## 2023-04-28 DIAGNOSIS — L600 Ingrowing nail: Secondary | ICD-10-CM

## 2023-04-28 DIAGNOSIS — I5022 Chronic systolic (congestive) heart failure: Secondary | ICD-10-CM | POA: Diagnosis not present

## 2023-04-28 DIAGNOSIS — I69890 Apraxia following other cerebrovascular disease: Secondary | ICD-10-CM

## 2023-04-28 NOTE — Therapy (Signed)
OUTPATIENT SPEECH LANGUAGE PATHOLOGY TREATMENT   Patient Name: Robin [name] MRN: [id number] DOB:September 22, 1965, 57 y.o., female Today's Date: 04/28/2023  PCP: Jarold Motto, PA REFERRING PROVIDER: Jarold Motto, PA  END OF SESSION:  End of Session - 04/28/23 1226     Visit Number 8    Number of Visits 16    Date for SLP Re-Evaluation 05/29/23    Authorization Type UHC Medicare and Medicaid secondary; Dual complete   eff 08/05/22 ded 240 met oop 8850 met 2034.20 limit-no auth-no co ins-20%   SLP Start Time 1030    SLP Stop Time  1115    SLP Time Calculation (min) 45 min    Activity Tolerance Patient tolerated treatment well             Past Medical History:  Diagnosis Date   A-fib (HCC)    Abdominal aortic aneurysm (AAA) (HCC)    Allergy    Anemia    Arthritis    Atrial fibrillation (HCC)    CHF (congestive heart failure) (HCC)    a. EF at 30-35% by echo in 08/2018 b. EF at 35% by repeat echo in 05/2019 c. EF improved to 65-70% in 2021 d. EF at 40-45% in 08/2021.   Chronic abdominal pain    Chronic atrial fibrillation (HCC)    Cocaine abuse (HCC)    COPD (chronic obstructive pulmonary disease) (HCC)    Diabetes mellitus without complication (HCC)    Essential hypertension, benign    Expressive aphasia    Expressive aphasia    post CVA   Fatty liver    GERD (gastroesophageal reflux disease)    Gout 2016   Normal coronary arteries    3/10 - following abnormal Myoview   Ovarian cyst    Stroke (HCC) 12/26/2019   Right sided weakness, and expressive aphasia   Stroke [facility])    Thoracic ascending aortic aneurysm (HCC)    4 cm 10/31/19 CTA   Type 2 diabetes mellitus (HCC)    type II   Past Surgical History:  Procedure Laterality Date   ABDOMINAL HYSTERECTOMY  09/10/2011   Procedure: HYSTERECTOMY ABDOMINAL;  Surgeon: Tilda Burrow, MD;  Location: [facility];  Service: Gynecology;  Laterality: N/A;  Abdominal hysterectomy   CESAREAN SECTION  [phone], and 1994    CHOLECYSTECTOMY  1995   IR 3D INDEPENDENT WKST  03/16/2020   IR ANGIO INTRA EXTRACRAN SEL INTERNAL CAROTID BILAT MOD SED  03/16/2020   IR ANGIO VERTEBRAL SEL SUBCLAVIAN INNOMINATE UNI L MOD SED  03/16/2020   IR ANGIO VERTEBRAL SEL VERTEBRAL UNI R MOD SED  03/16/2020   IR CT HEAD LTD  12/27/2019   IR PERCUTANEOUS ART THROMBECTOMY/INFUSION INTRACRANIAL INC DIAG ANGIO  12/27/2019   IR US GUIDE VASC ACCESS RIGHT  12/27/2019   IR US GUIDE VASC ACCESS RIGHT  03/16/2020   RADIOLOGY WITH ANESTHESIA N/A 12/27/2019   Procedure: IR WITH ANESTHESIA;  Surgeon: Julieanne Cotton, MD;  Location: [facility];  Service: Radiology;  Laterality: N/A;   SCAR REVISION  09/10/2011   Procedure: SCAR REVISION;  Surgeon: Tilda Burrow, MD;  Location: [facility];  Service: Gynecology;  Laterality: N/A;  Wide Excision of old Cicatrix   TUBAL LIGATION  1994   Patient Active Problem List   Diagnosis Date Noted   Acute on chronic systolic CHF (congestive heart failure) (HCC) 11/27/2021   Stage 3b chronic kidney disease (CKD) (HCC) 11/27/2021   Microcytic anemia 11/27/2021   Lactic acidosis 11/27/2021  Diabetes mellitus (HCC) 03/09/2021   Type 2 diabetes mellitus with stage 3a chronic kidney disease, with long-term current use of insulin (HCC) 03/09/2021   Acute metabolic encephalopathy 12/31/2020   Hyperglycemia 12/31/2020   Prolonged QT interval 05/12/2020   History of CVA with residual deficit 05/12/2020   Atrial fibrillation, chronic (HCC) 05/12/2020   Right hemiparesis (HCC)    Essential hypertension    Chronic diastolic congestive heart failure (HCC)    Dysphagia due to recent stroke 01/05/2020   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 01/05/2020   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 12/27/2019   Thoracic aortic aneurysm without rupture (HCC)    Atrial fibrillation with RVR (HCC) 05/10/2019   Type 2 diabetes mellitus with diabetic autonomic neuropathy, with long-term current use of insulin  (HCC) 09/01/2018   Cocaine abuse (HCC) 08/25/2018   Cigarette nicotine dependence, uncomplicated 06/07/2015   GERD (gastroesophageal reflux disease) 06/07/2011   COPD (chronic obstructive pulmonary disease) (HCC) 06/07/2011   Arthritis 06/07/2011    ONSET DATE: May 2021   REFERRING DIAG: R47.01 (ICD-10-CM) - Expressive aphasia  THERAPY DIAG:  Aphasia  Apraxia following other cerebrovascular disease  Cognitive communication deficit  Rationale for Evaluation and Treatment: Rehabilitation  SUBJECTIVE:   SUBJECTIVE STATEMENT: "Close the door."  Pt accompanied by: self and family member  PERTINENT HISTORY: Robin [name] is a pleasant 57 y.o. female with medical history significant for atrial fibrillation on Eliquis, chronic systolic CHF, CKD 3B, insulin-dependent diabetes mellitus, polysubstance abuse, COPD, GERD, HTN, and history of CVA with residual right-sided weakness and aphasia (L MCA CVA and L ICA occlusion s/p thrombectomy on 12/27/2019 followed by CIR stay where she received ST/OT/PT until d/c home on 02/02/20 and then [facility]). She is referred for outpatient SLP therapy by Jarold Motto, PA to facilitate increased independence with communication.  PAIN:  Are you having pain? No  PATIENT GOALS: Increase ability to talk  OBJECTIVE:   DIAGNOSTIC FINDINGS:  MRI 11/13/2020: IMPRESSION: No evidence of acute intracranial abnormality, including acute infarction.   Redemonstrated large chronic left MCA territory infarct.   Redemonstrated small chronic cortically based infarct within the posterior right temporal lobe.   Mild bilateral ethmoid sinus mucosal thickening.  PATIENT EDUCATION: Education details: Practice the "Say it, Write it, Read it" HEP provided with pictures Main educated: Patient and sister, [name] Education method: Explanation and Handouts Education comprehension: verbalized understanding and needs further  education   GOALS: Goals reviewed with patient? Yes  SHORT TERM GOALS: Target date: 05/28/2023  Pt will increase verbal naming of common objects/pictures to 75% acc when provided with mod multimodality cues Baseline: 80% when allowed to write response or provided mod/max cues Goal status: MET for 100% acc for mod/max cues (phonemic cue provided); change to 100% acc with mod cues.  2.  Pt will describe objects and pictures by providing at least three salient features (single words ok) as judged by clinician with 80% acc when provided mod cues. Baseline: 70% mod/max cues Goal status: MET for 100% with mod/max cues provided; change to 100% acc with mod cues.  3.  Pt will complete single word sentence completion tasks [place]) with 90% acc when provided with min/mod multimodality cues.  Baseline: 75% mod cues of initial phoneme modeled Goal status: MET for 100% acc with mi/mod cues provided; change to 100% with min cues.  4.  Pt will complete basic level reading comprehension tasks (picture cues for sentence level) with 80% acc  with provided mi/mod cues. Baseline: 100% word to picture matching f=5, 0/5 sentences Goal status: MET for single words, continue for sentences  5.  Pt will verbally describe action photos with use of "who" "what doing" template with 80% acc and min/mod cues.  Baseline: 70% mod cues. Goal status: MET for 100% acc with mod/max cues; change to 100% with mod cues.  6.  Pt will complete personally relevant responsive naming tasks with 90% acc with use of written cues as needed. Baseline: 75% acc Goal status: MET  7. Pt will use a communication device to cue themselves to speak (reading or imitating words) during 1:1 conversations with SLP with min/mod cues over 3 consecutive sessions.  Baseline: Max assist  Goal status: NEW  8. Pt will respond to personal preference questions by answering with alternative communication device with 90% acc and min assist.  Baseline:  Max assist  Goal status: NEW  LONG TERM GOALS: Target date: 06/25/2023  Pt will communicate moderately complex wants/needs to [facility] with min assist from communication partner and use of multimodality communication strategies. Baseline: Pt requires mod assist from communication partner  Goal status: ONGOING   ASSESSMENT:  PREVIOUS TREATMENT:     03/26/23: Pt accompanied to therapy this date by her caregiver, Agustin Cree and her sister. They brought in a notebook and completed a good portion of the personalized communication template. SLP reviewed the information with Pt and Pt asked to read names of family members. Pt able to read 75% of the names and benefited from cues to look away/provide diversion and return to task when she could not read it right away. Family provided education on ways to help her with this at home. She was able to write the letters with her finger on the table 90% of the time and even orally spell when unable to say the word. Pt completed automatic speech tasks with 80% acc, but needed cues to decrease rate to increase accuracy of responses. Goals and evaluation were reviewed. Pt smiling and engaged throughout the session. She occasionally stated, "I can't do that", but with encouragement she tried to complete the request. Her sister commented that she seems to be able to communicate with some family members better than others and SLP suggested that she ascertain the content of conversation (ie. Harder to talk about personal needs with sister than lighter conversation with an aunt). Target above goals next session.   04/03/2023: Pt accompanied by caregiver, Agustin Cree and brought in her notebook. Pt completed confrontation naming task with 50% acc without cues and increased to 90% acc with cues (Pt able to use letter board to spell the word 90% of the time). She completed single word sentence completion (SWSC) task by providing accurate verbal response when looking at pictures with 80%  acc with mi/mod cues. She was then asked to repeat the word x3 and SLP interjected with confounds at times and Pt asked to return to word. This was very challenging for Pt with performance improved throughout trials. She was able to provide a gesture for each work with 100% acc. She answered responsive naming questions with use of personal communication template as needed with 95% acc. SLP provided strategies for accurate number production by having Pt count on her fingers and show the number on her hands and then verbalize the number, which appeared effective. Introduce the "Say it" set list of words next session.   04/09/2023: Pt accompanied by her caregiver, Darlene. Pt indicated that she went to the  ED yesterday due to chest pain and nose bleed. She currently feels much better. She completed confrontation naming of high frequency words with 50% acc, single word sentence completion task with 100% acc with min cues, responsive naming with 90% and mod cues, and elicited appropriate gesture for words with 100% acc with min assist. She was cued to use her personalized communication template to help find answers to responsive naming tasks. During confrontation naming tasks, she benefited from being able to write the word, but had difficulty saying the word without phonemic and sentence cues. New words were added to her communication template (purse, keys, phone, water, etc). Continue to target goals above and focus on responsive naming tasks for personal information.     04/14/2023: Pt accompanied by her caregiver, Darlene. She verbalized that she went to [place] over the weekend to see friends/family. She completed responsive naming tasks in conversation regarding personally relevant information with 90% acc with ~75% acc for verbal responses and 100% when allowed to write or use written cues. She completed confrontation naming task when looking at icons on a tablet with 70% acc with mod cues, but was able to  repeat the name of the item after model provided, with 90% acc. She was introduced to the [place] tablet today and she expressed interest in using it to help her communicate. She indicates that she thinks she has something similar at home and will bring in next session. She was able to quickly point to 7 fruits that she likes to eat and was able to repeat 6/7 after model provided. Update goals next session.       04/17/23:  Pt was accompanied by a new caregiver today because Darlene had an appointment. Pt was able to communicate this to SLP with use of written personalized template to identify the name "Darlene". Robin [name] answered personally relevant responsive naming questions (regarding names of friends, family, and medical professionals) with 100% acc with use of communication support as needed and accurate verbal response 90% of the time with min cues. Pt verbalized a subject, verb/action, and object when looking at action photos with 92% acc with mi/mod cues and was then able to repeat with written model provided with 100% acc with min assist. She then completed single word sentence completion task with 100% acc with min cues. Robin [name] occasionally shuts down when a task feels out of her reach or too challenging and needs encouragement to try the task. She was trying to verbalize "smelling" and produced "smoking" instead, but was able to break the perseveration and produce "smelling" on ~70% of cued trials. She was given divergent naming task with word bank provided for homework and completed trial x 2 before ending the session. Next session, continue to expansive naming tasks to generate sentences when looking at photos.   04/23/2023: Pt accompanied to therapy by Darlene. She completed responsive naming task by answering personally relevant questions with use of written cues provided with 100% acc. She is unable to verbalize responses independently without use of written cues or initial phoneme cue from SLP.  She named common objects with 100% acc when allowed to write her response or when given initial phoneme cue and she completed single word sentence completion tasks with 100% acc with provided mi/mod cues. She independently uses total communication strategies by gesturing, writing, or looking for written response when she is unable to verbalize her response. Unfortunately, she needs environmental supports via communication partner, written templates, gestures, and ability to hand write  responses to communicate her thoughts. Pt would make an excellent candidate for a dynamic communication system. We have tried some apps on her phone, however she would benefit from a dedicated speech generating device. Goals have been updated and will request a trial of a communication device. Pt is in agreement with plan of care. Requesting 2x/week for 4 more weeks.   CURRENT TREATMENT: Pt accompanied by a different caregiver today and she was able to locate her name by finding it in her contacts on her phone. She completed responsive naming tasks with 100% acc with use of communication template, described action pictures using subject and verb with 100% acc with mod cues, completed SWSC tasks with 100% acc with min cues, confrontation naming tasks for high frequency words with 100% acc with mod/max cues. She was able to repeat 5 word sentences with 100% acc when provided mi/mod cues from SLP. SLP submitted request for AAC device trials and Pt expressed excitement over having a more efficient way to help her communicate. Continue to target goals next session.                                                                                                                     DATE: 04/28/23  CLINICAL IMPRESSION: (from initial evaluation on 03/19/2023) Patient is a 57 y.o. female who was seen today for speech/language evaluation s/p CVA in May of 2021 with only limited follow up SLP therapy via home health in 2021 when she was  staying with her daughter. [name] sister accompanied her to the evaluation and would like for her sister to be able to communicate her wants and needs verbally. 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 Pt has difficulty verbally labeling high frequency objects (but was able to write some responses), repeating longer words/phrases, and reading phrases. She was able to match 5/5 high frequency objects to single words, but was only able to orally read/label 1/5 words. Pt benefited from gesturing to communicate her intent and this occasionally helped elicit word production and she also benefited from SLP phonemic cues and single word sentence completion cues. Recommend SLP therapy to address communication deficits and increase independence with communication 2x/week for 4-8 weeks.  OBJECTIVE IMPAIRMENTS: include executive functioning, expressive language, receptive language, aphasia, and apraxia. These impairments are limiting patient from managing medications, managing appointments, managing finances, and effectively communicating at home and in community. Factors affecting potential to achieve goals and functional outcome are ability to learn/carryover information, severity of impairments, and time post onset of aphasia (2021) . Patient will benefit from skilled SLP services to address above impairments and improve overall function.  REHAB POTENTIAL: Good  PLAN:  SLP FREQUENCY: 2x/week  SLP DURATION: 8 weeks  PLANNED INTERVENTIONS:  Cueing hierachy, Internal/external aids, Functional tasks, Multimodal communication approach, SLP instruction and feedback, Compensatory  strategies, Patient/family education, and Re-evaluation    Thank you,  Havery Moros, CCC-SLP [phone]  Tenzin Pavon, CCC-SLP 04/28/2023, 12:38 PM

## 2023-04-28 NOTE — Patient Instructions (Signed)
Medication Instructions:  Your physician recommends that you continue on your current medications as directed. Please refer to the Current Medication list given to you today.   *If you need a refill on your cardiac medications before your next appointment, please call your pharmacy*   Lab Work: NONE   If you have labs (blood work) drawn today and your tests are completely normal, you will receive your results only by: MyChart Message (if you have MyChart) OR A paper copy in the mail If you have any lab test that is abnormal or we need to change your treatment, we will call you to review the results.   Testing/Procedures: Your physician has requested that you have a lexiscan myoview. For further information please visit [email or web address] Please follow instruction sheet, as given.    Follow-Up: At [facility], you and your health needs are our priority.  As part of our continuing mission to provide you with exceptional heart care, we have created designated Provider Care Teams.  These Care Teams include your primary Cardiologist (physician) and Advanced Practice Providers (APPs -  Physician Assistants and Nurse Practitioners) who all work together to provide you with the care you need, when you need it.  We recommend signing up for the patient portal called "MyChart".  Sign up information is provided on this After Visit Summary.  MyChart is used to connect with patients for Virtual Visits (Telemedicine).  Patients are able to view lab/test results, encounter notes, upcoming appointments, etc.  Non-urgent messages can be sent to your provider as well.   To learn more about what you can do with MyChart, go to [email or web address].    Your next appointment:   2 month(s)  Provider:   You may see Dina Rich, MD or one of the following Advanced Practice Providers on your designated Care Team:   Randall An, PA-C  Jacolyn Reedy, [place]     Other Instructions Thank  you for choosing [facility]!

## 2023-04-28 NOTE — Progress Notes (Signed)
Cardiology Office Note:    Date:  04/28/2023   ID:  Robin [name], DOB May 10, 1966, MRN [id number]  PCP:  Jarold Motto, PA  [facility] Cardiologist:  Dina Rich, MD  [facility] Electrophysiologist:  None   Referring MD: Jarold Motto, PA   Chief Complaint:  3 month follow-up/ER follow-up  History of Present Illness:    Robin [name] is a 57 y.o. female with a hx of chronic HFrEF suspected tachycardia mediated, persistent A-fib Eliquis, hypertension, CKD, history of CVA.  Patient has a history of HFrEF with EF as low as 30 to 35% on echo in January 2020 in the setting of substance abuse and A-fib.  EF improved to 65 to 70% by echo in September 2021.   History of A-fib diagnosed in September 2020.  Rate control was pursued given substance abuse history.  Patient was admitted in April 2023 with CHF exacerbation and A-fib RVR.  Echo showed reduced EF 25 to 30%..  Patient was diuresed and discharged home on Lasix.  Follow-up echo in July 2024 showed LVEF 40 to 45%, global hypokinesis, moderate LVH, severely dilated by atrium, mild to moderate aortic stenosis.  Patient went to the ER 04/08/2023 with chest pain, negative troponin.  EKG showed rate controlled A-fib.  Otherwise, workup was unremarkable  Today, the patient reports no further chest pain since the ER visit.  Pain occurred at rest and was left sided. She felt her heart racing short of breath.  It appears patient always is in Afib. She is taking Eliquis and ASA. She takes Imdur 60mg  daily.    Past Medical History:  Diagnosis Date   A-fib [facility])    Abdominal aortic aneurysm (AAA) (HCC)    Allergy    Anemia    Arthritis    Atrial fibrillation (HCC)    CHF (congestive heart failure) (HCC)    a. EF at 30-35% by echo in 08/2018 b. EF at 35% by repeat echo in 05/2019 c. EF improved to 65-70% in 2021 d. EF at 40-45% in 08/2021.   Chronic abdominal pain    Chronic atrial fibrillation (HCC)    Cocaine abuse (HCC)     COPD (chronic obstructive pulmonary disease) (HCC)    Diabetes mellitus without complication (HCC)    Essential hypertension, benign    Expressive aphasia    Expressive aphasia    post CVA   Fatty liver    GERD (gastroesophageal reflux disease)    Gout 2016   Normal coronary arteries    3/10 - following abnormal Myoview   Ovarian cyst    Stroke (HCC) 12/26/2019   Right sided weakness, and expressive aphasia   Stroke [facility]    Thoracic ascending aortic aneurysm (HCC)    4 cm 10/31/19 CTA   Type 2 diabetes mellitus (HCC)    type II    Past Surgical History:  Procedure Laterality Date   ABDOMINAL HYSTERECTOMY  09/10/2011   Procedure: HYSTERECTOMY ABDOMINAL;  Surgeon: Tilda Burrow, MD;  Location: [facility];  Service: Gynecology;  Laterality: N/A;  Abdominal hysterectomy   CESAREAN SECTION  9629,5284, and 1994   CHOLECYSTECTOMY  1995   IR 3D INDEPENDENT WKST  03/16/2020   IR ANGIO INTRA EXTRACRAN SEL INTERNAL CAROTID BILAT MOD SED  03/16/2020   IR ANGIO VERTEBRAL SEL SUBCLAVIAN INNOMINATE UNI L MOD SED  03/16/2020   IR ANGIO VERTEBRAL SEL VERTEBRAL UNI R MOD SED  03/16/2020   IR CT HEAD LTD  12/27/2019  IR PERCUTANEOUS ART THROMBECTOMY/INFUSION INTRACRANIAL INC DIAG ANGIO  12/27/2019   IR US GUIDE VASC ACCESS RIGHT  12/27/2019   IR US GUIDE VASC ACCESS RIGHT  03/16/2020   RADIOLOGY WITH ANESTHESIA N/A 12/27/2019   Procedure: IR WITH ANESTHESIA;  Surgeon: Julieanne Cotton, MD;  Location: [facility];  Service: Radiology;  Laterality: N/A;   SCAR REVISION  09/10/2011   Procedure: SCAR REVISION;  Surgeon: Tilda Burrow, MD;  Location: [facility];  Service: Gynecology;  Laterality: N/A;  Wide Excision of old Cicatrix   TUBAL LIGATION  1994    Current Medications: Current Meds  Medication Sig   Accu-Chek Softclix Lancets lancets Use as instructed   aspirin EC 81 MG tablet Take 1 tablet (81 mg total) by mouth daily. Swallow whole.   Blood Glucose Monitoring Suppl (ACCU-CHEK GUIDE ME) w/Device KIT  1 Device by Does not apply route 3 (three) times daily.   carvedilol (COREG) 25 MG tablet TAKE 2 TABLETS BY MOUTH TWICE DAILY WITH A MEAL   cetaphil (CETAPHIL) lotion Apply 1 Application topically as needed for dry skin.   Continuous Blood Gluc Sensor (DEXCOM G6 SENSOR) MISC 1 Device by Does not apply route as directed.   Continuous Blood Gluc Transmit (DEXCOM G6 TRANSMITTER) MISC 1 Device by Does not apply route as directed.   dicyclomine (BENTYL) 20 MG tablet Take 1 tablet (20 mg total) by mouth 2 (two) times daily as needed for spasms.   Dulaglutide (TRULICITY) 1.5 MG/0.5ML SOPN Inject 1.5 mg into the skin once a week.   ELIQUIS 5 MG TABS tablet Take 1 tablet by mouth twice daily   empagliflozin (JARDIANCE) 10 MG TABS tablet Take 1 tablet (10 mg total) by mouth daily before breakfast.   Ferrous Sulfate (IRON) 325 (65 Fe) MG TABS Take 1 tablet by mouth once daily   furosemide (LASIX) 20 MG tablet TAKE 1 TABLET BY MOUTH ONCE DAILY (MAY TAKE ADDITIONAL 20 MG IF WEIGHT GAIN IS OVER 2LBS OVERNIGHT OR 5 LBS IN A WEEK)   gabapentin (NEURONTIN) 100 MG capsule TAKE 2 CAPSULES BY MOUTH THREE TIMES DAILY   glucose blood (ACCU-CHEK GUIDE) test strip USE 1 STRIP TO CHECK GLUCOSE THREE TIMES DAILY   hydrALAZINE (APRESOLINE) 25 MG tablet TAKE 1 TABLET BY MOUTH THREE TIMES DAILY   insulin aspart (NOVOLOG FLEXPEN) 100 UNIT/ML FlexPen Inject 12 Units into the skin 3 (three) times daily with meals.   insulin glargine (LANTUS SOLOSTAR) 100 UNIT/ML Solostar Pen Inject 44 Units into the skin daily.   Insulin Pen Needle (BD PEN NEEDLE NANO U/F) 32G X 4 MM MISC Inject 1 Device into the skin in the morning, at noon, in the evening, and at bedtime.   isosorbide mononitrate (IMDUR) 30 MG 24 hr tablet Take 2 tablets by mouth once daily   pantoprazole (PROTONIX) 40 MG tablet Take 1 tablet by mouth once daily   timolol (TIMOPTIC) 0.5 % ophthalmic solution Place 1 drop into both eyes 2 (two) times daily.     Allergies:    Bee venom, Losartan, Naproxen, Penicillins, and Lisinopril   Social History   Socioeconomic History   Marital status: Single    Spouse name: Not on file   Number of children: Not on file   Years of education: Not on file   Highest education level: Not on file  Occupational History   Not on file  Tobacco Use   Smoking status: Former    Current packs/day: 0.00    Types: Cigarettes  Start date: 12/1990    Quit date: 12/2019    Years since quitting: 3.4   Smokeless tobacco: Never  Vaping Use   Vaping status: Never Used  Substance and Sexual Activity   Alcohol use: Not Currently    Comment: occ   Drug use: Not Currently   Sexual activity: Not Currently    Birth control/protection: Surgical  Other Topics Concern   Not on file  Social History Narrative   ** Merged History Encounter **       Part-time at [employer], lives in [place], [name] 3 children  Married   Social Determinants of Corporate investment banker Strain: Not on file  Food Insecurity: Not on file  Transportation Needs: Not on file  Physical Activity: Not on file  Stress: Not on file  Social Connections: Not on file     Family History: The patient's family history includes Alcohol abuse in her father, mother, and son; Cirrhosis in her mother; Diabetes type II in her father and sister; Early death in her brother and mother. There is no history of Anesthesia problems, Hypotension, Malignant hyperthermia, Pseudochol deficiency, Colon cancer, Colon polyps, Esophageal cancer, Rectal cancer, or Stomach cancer.  ROS:   Please see the history of present illness.     All other systems reviewed and are negative.  EKGs/Labs/Other Studies Reviewed:    The following studies were reviewed today:  Echo 02/2023 1. Left ventricular ejection fraction, by estimation, is 40 to 45%. The  left ventricle has mildly decreased function. The left ventricle  demonstrates global hypokinesis. There is moderate left  ventricular  hypertrophy. Left ventricular diastolic  parameters are indeterminate.   2. Right ventricular systolic function is low normal. The right  ventricular size is normal. Tricuspid regurgitation signal is inadequate  for assessing PA pressure.   3. Left atrial size was severely dilated.   4. Right atrial size was severely dilated.   5. The mitral valve is normal in structure. No evidence of mitral valve  regurgitation. No evidence of mitral stenosis.   6. Mild to moderate aortic stenosis. Lower than expected likely due to  low stroke volume index of 26. . The aortic valve is tricuspid. There is  mild calcification of the aortic valve. There is mild thickening of the  aortic valve. Aortic valve  regurgitation is not visualized. Mild to moderate aortic valve stenosis.  Aortic valve mean gradient measures 14.0 mmHg. Aortic valve peak gradient  measures 21.9 mmHg. Aortic valve area, by VTI measures 1.05 cm.   7. The inferior vena cava is dilated in size with <50% respiratory  variability, suggesting right atrial pressure of 15 mmHg.   Echo 11/2021 1. Left ventricular ejection fraction, by estimation, is 25 to 30%. The  left ventricle has severely decreased function. The left ventricle  demonstrates global hypokinesis. There is mild concentric left ventricular  hypertrophy.   2. Right ventricular systolic function is normal. The right ventricular  size is normal.   3. Left atrial size was severely dilated.   4. The mitral valve is normal in structure. No evidence of mitral valve  regurgitation. No evidence of mitral stenosis.   5. The aortic valve was not well visualized. Aortic valve regurgitation  is not visualized.   Echo 08/2021  1. Poor acoustic windows. Difficult to see endocardium Overall LVEF  appearsdepressed. Different from echo in Dec 2021 (images better in this  exam). Consider limited echo with Definity to confirm. . Left ventricular  ejection  fraction, by  estimation, is  40 to 45%. The left ventricle has mildly decreased function. The left  ventricle demonstrates global hypokinesis. There is mild left ventricular  hypertrophy. Left ventricular diastolic parameters are indeterminate.   2. Right ventricular systolic function is mildly reduced. The right  ventricular size is normal.   3. Left atrial size was severely dilated.   4. Right atrial size was severely dilated.   5. A small pericardial effusion is present.   6. Mild mitral valve regurgitation.   7. AV is thickneed, calcified. Appears functionally bicuspid Peak and  mean gradients through the valve are 18 and 11 mm Hg respectively  consistent with very mild AS. Marland Kitchen Aortic valve regurgitation is mild. Aortic  valve sclerosis/calcification is present,  without any evidence of aortic stenosis.   8. The inferior vena cava is dilated in size with <50% respiratory  variability, suggesting right atrial pressure of 15 mmHg.   Echo limited 07/2020 1. Left ventricular ejection fraction, by estimation, is 50 to 55%. The  left ventricle has low normal function.   2. A small pericardial effusion is present. The pericardial effusion is  circumferential. There is no evidence of cardiac tamponade. Effusion  appears slightly larger compared to the 05/12/2020 study.   3. The inferior vena cava is normal in size with greater than 50%  respiratory variability, suggesting right atrial pressure of 3 mmHg.   Echo limited 05/2020   1. Left ventricular ejection fraction, by estimation, is 70 to 75%. The  left ventricle has hyperdynamic function. The left ventricle has no  regional wall motion abnormalities. There is moderate left ventricular  hypertrophy. Left ventricular diastolic  parameters are indeterminate.   2. Right ventricular systolic function is normal. The right ventricular  size is normal. Tricuspid regurgitation signal is inadequate for assessing  PA pressure.   3. A small pericardial  effusion is present. The pericardial effusion is  circumferential.   4. The mitral valve is abnormal. There is mild prolapse of the anterior  leaflet. Mild mitral valve regurgitation.   5. The aortic valve is tricuspid. Aortic valve regurgitation is not  visualized.   6. The inferior vena cava is normal in size with greater than 50%  respiratory variability, suggesting right atrial pressure of 3 mmHg.   EKG:  EKG is not ordered today.   Recent Labs: 12/02/2022: Magnesium 2.1; TSH 1.90 01/26/2023: ALT 13 04/08/2023: BUN 23; Creatinine, Ser 1.77; Hemoglobin 11.1; Platelets 260; Potassium 3.8; Sodium 136  Recent Lipid Panel    Component Value Date/Time   CHOL 117 12/02/2022 0847   TRIG 97 12/02/2022 0847   HDL 38 (L) 12/02/2022 0847   CHOLHDL 3.1 12/02/2022 0847   VLDL 26 12/27/2019 0305   LDLCALC 61 12/02/2022 0847     Physical Exam:    VS:  BP 126/72   Pulse 84   Ht 5\' 7"  (1.702 m)   Wt 237 lb 6.4 oz (107.7 kg)   LMP 08/21/2011   SpO2 98%   BMI 37.18 kg/m     Wt Readings from Last 3 Encounters:  04/28/23 237 lb 6.4 oz (107.7 kg)  04/08/23 233 lb 11 oz (106 kg)  02/05/23 235 lb 6.1 oz (106.8 kg)     GEN:  Well nourished, well developed in no acute distress HEENT: Normal NECK: No JVD; No carotid bruits LYMPHATICS: No lymphadenopathy CARDIAC: Irreg IRreg, + murmur, rubs, gallops RESPIRATORY:  Clear to auscultation without rales, wheezing or rhonchi  ABDOMEN: Soft,  non-tender, non-distended MUSCULOSKELETAL:  No edema; No deformity  SKIN: Warm and dry NEUROLOGIC:  Alert and oriented x 3 PSYCHIATRIC:  Normal affect   ASSESSMENT:    1. Chest pain of uncertain etiology   2. Chronic systolic heart failure (HCC)   3. Atrial fibrillation, unspecified type (HCC)   4. Essential hypertension    PLAN:    In order of problems listed above:  Chest pain Patient had a ER visit 04/08/2023 for typical chest pain, ruled out for ACS.  EKG showed rate controlled A-fib, 92 bpm.   Reports she has had this chest pain when heart rate is going fast.  She has a history of persistent (likely permanent) A-fib.  Today heart rate is in the 80s.  She denies any further chest pain.  EF has been low in the past, suspected tachycardia mediated in the setting of A-fib and substance use, although have not ruled out CAD.  I will order Lexiscan Myoview.  Continue aspirin, Coreg, Imdur 60 mg daily.  HFmrEF Patient has history of reduced EF as low as 25 to 30% in the setting of A-fib RVR and substance abuse.  Most recent echo showed EF of 40 to 45%.  Patient is euvolemic on exam.  GDT M is limited by CKD.  Continue Coreg 25 mg twice daily and Jardiance 10 mg daily.  Patient takes Lasix 20 mg daily for volume management.  Long-standing persistent Afib Patient has long history of A-fib, may be permanent at this point.  Have not attempted rhythm control due to severe LAE.  Patient is in rate controlled A-fib.  Chest pain episode may have been from elevated A-fib rates, although EKG showed A-fib rates in the 90s.  Continue Eliquis and Coreg.  Hypertension Blood pressure is well-controlled today.  Continue Coreg.  Disposition: Follow up in 2 month(s) with MD/APP   Shared Decision Making/Informed Consent   Informed Consent   Shared Decision Making/Informed Consent The risks [chest pain, shortness of breath, cardiac arrhythmias, dizziness, blood pressure fluctuations, myocardial infarction, stroke/transient ischemic attack, nausea, vomiting, allergic reaction, radiation exposure, metallic taste sensation and life-threatening complications (estimated to be 1 in 10,000)], benefits (risk stratification, diagnosing coronary artery disease, treatment guidance) and alternatives of a nuclear stress test were discussed in detail with Ms. Dimeo and she agrees to proceed.       Signed, Radek Carnero David Stall, PA-C  04/28/2023 4:30 PM    [facility]

## 2023-04-28 NOTE — Telephone Encounter (Signed)
Okay for referral to Podiatry?

## 2023-04-30 ENCOUNTER — Ambulatory Visit (HOSPITAL_COMMUNITY): Payer: 59 | Admitting: Speech Pathology

## 2023-04-30 ENCOUNTER — Encounter (HOSPITAL_COMMUNITY): Payer: Self-pay | Admitting: Speech Pathology

## 2023-04-30 DIAGNOSIS — I69890 Apraxia following other cerebrovascular disease: Secondary | ICD-10-CM

## 2023-04-30 DIAGNOSIS — R4701 Aphasia: Secondary | ICD-10-CM

## 2023-04-30 DIAGNOSIS — R41841 Cognitive communication deficit: Secondary | ICD-10-CM

## 2023-04-30 NOTE — Therapy (Signed)
OUTPATIENT SPEECH LANGUAGE PATHOLOGY TREATMENT   Patient Name: Robin [name] MRN: [id number] DOB:October 07, 1965, 57 y.o., female Today's Date: 04/30/2023  PCP: Robin Motto, PA REFERRING PROVIDER: Jarold Motto, PA  END OF SESSION:  End of Session - 04/30/23 1020     Visit Number 9    Number of Visits 16    Date for SLP Re-Evaluation 05/29/23    Authorization Type [employer] Medicare and Medicaid secondary; Dual complete   eff 08/05/22 ded 240 met oop 8850 met 2034.20 limit-no auth-no co ins-20%   SLP Start Time 0930    SLP Stop Time  1015    SLP Time Calculation (min) 45 min    Activity Tolerance Patient tolerated treatment well             Past Medical History:  Diagnosis Date   A-fib (HCC)    Abdominal aortic aneurysm (AAA) (HCC)    Allergy    Anemia    Arthritis    Atrial fibrillation (HCC)    CHF (congestive heart failure) (HCC)    a. EF at 30-35% by echo in 08/2018 b. EF at 35% by repeat echo in 05/2019 c. EF improved to 65-70% in 2021 d. EF at 40-45% in 08/2021.   Chronic abdominal pain    Chronic atrial fibrillation (HCC)    Cocaine abuse (HCC)    COPD (chronic obstructive pulmonary disease) (HCC)    Diabetes mellitus without complication (HCC)    Essential hypertension, benign    Expressive aphasia    Expressive aphasia    post CVA   Fatty liver    GERD (gastroesophageal reflux disease)    Gout 2016   Normal coronary arteries    3/10 - following abnormal Myoview   Ovarian cyst    Stroke (HCC) 12/26/2019   Right sided weakness, and expressive aphasia   Stroke [facility])    Thoracic ascending aortic aneurysm (HCC)    4 cm 10/31/19 CTA   Type 2 diabetes mellitus (HCC)    type II   Past Surgical History:  Procedure Laterality Date   ABDOMINAL HYSTERECTOMY  09/10/2011   Procedure: HYSTERECTOMY ABDOMINAL;  Surgeon: Robin Burrow, MD;  Location: [facility];  Service: Gynecology;  Laterality: N/A;  Abdominal hysterectomy   CESAREAN SECTION  [phone], and 1994    CHOLECYSTECTOMY  1995   IR 3D INDEPENDENT WKST  03/16/2020   IR ANGIO INTRA EXTRACRAN SEL INTERNAL CAROTID BILAT MOD SED  03/16/2020   IR ANGIO VERTEBRAL SEL SUBCLAVIAN INNOMINATE UNI L MOD SED  03/16/2020   IR ANGIO VERTEBRAL SEL VERTEBRAL UNI R MOD SED  03/16/2020   IR CT HEAD LTD  12/27/2019   IR PERCUTANEOUS ART THROMBECTOMY/INFUSION INTRACRANIAL INC DIAG ANGIO  12/27/2019   IR US GUIDE VASC ACCESS RIGHT  12/27/2019   IR US GUIDE VASC ACCESS RIGHT  03/16/2020   RADIOLOGY WITH ANESTHESIA N/A 12/27/2019   Procedure: IR WITH ANESTHESIA;  Surgeon: Robin Cotton, MD;  Location: [facility];  Service: Radiology;  Laterality: N/A;   SCAR REVISION  09/10/2011   Procedure: SCAR REVISION;  Surgeon: Robin Burrow, MD;  Location: [facility];  Service: Gynecology;  Laterality: N/A;  Wide Excision of old Cicatrix   TUBAL LIGATION  1994   Patient Active Problem List   Diagnosis Date Noted   Acute on chronic systolic CHF (congestive heart failure) (HCC) 11/27/2021   Stage 3b chronic kidney disease (CKD) (HCC) 11/27/2021   Microcytic anemia 11/27/2021   Lactic acidosis 11/27/2021  Diabetes mellitus (HCC) 03/09/2021   Type 2 diabetes mellitus with stage 3a chronic kidney disease, with long-term current use of insulin (HCC) 03/09/2021   Acute metabolic encephalopathy 12/31/2020   Hyperglycemia 12/31/2020   Prolonged QT interval 05/12/2020   History of CVA with residual deficit 05/12/2020   Atrial fibrillation, chronic (HCC) 05/12/2020   Right hemiparesis (HCC)    Essential hypertension    Chronic diastolic congestive heart failure (HCC)    Dysphagia due to recent stroke 01/05/2020   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 01/05/2020   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 12/27/2019   Thoracic aortic aneurysm without rupture (HCC)    Atrial fibrillation with RVR (HCC) 05/10/2019   Type 2 diabetes mellitus with diabetic autonomic neuropathy, with long-term current use of insulin  (HCC) 09/01/2018   Cocaine abuse (HCC) 08/25/2018   Cigarette nicotine dependence, uncomplicated 06/07/2015   GERD (gastroesophageal reflux disease) 06/07/2011   COPD (chronic obstructive pulmonary disease) (HCC) 06/07/2011   Arthritis 06/07/2011    ONSET DATE: May 2021   REFERRING DIAG: R47.01 (ICD-10-CM) - Expressive aphasia  THERAPY DIAG:  Aphasia  Apraxia following other cerebrovascular disease  Cognitive communication deficit  Rationale for Evaluation and Treatment: Rehabilitation  SUBJECTIVE:   SUBJECTIVE STATEMENT: "Robin [name]."  Pt accompanied by: self and family member  PERTINENT HISTORY: Robin [name] is a pleasant 57 y.o. female with medical history significant for atrial fibrillation on Eliquis, chronic systolic CHF, CKD 3B, insulin-dependent diabetes mellitus, polysubstance abuse, COPD, GERD, HTN, and history of CVA with residual right-sided weakness and aphasia (L MCA CVA and L ICA occlusion s/p thrombectomy on 12/27/2019 followed by CIR stay where she received ST/OT/PT until d/c home on 02/02/20 and then received some [facility] SLP services). She is referred for outpatient SLP therapy by Robin Motto, PA to facilitate increased independence with communication.  PAIN:  Are you having pain? No  PATIENT GOALS: Increase ability to talk  OBJECTIVE:   DIAGNOSTIC FINDINGS:  MRI 11/13/2020: IMPRESSION: No evidence of acute intracranial abnormality, including acute infarction.   Redemonstrated large chronic left MCA territory infarct.   Redemonstrated small chronic cortically based infarct within the posterior right temporal lobe.   Mild bilateral ethmoid sinus mucosal thickening.  PATIENT EDUCATION: Education details: Practice the "Say it, Write it, Read it" HEP provided with pictures [name] educated: Patient and sister, Robin Education method: Explanation and Handouts Education comprehension: verbalized understanding and needs further education   GOALS: Goals  reviewed with patient? Yes  SHORT TERM GOALS: Target date: 05/28/2023  Pt will increase verbal naming of common objects/pictures to 75% acc when provided with mod multimodality cues Baseline: 80% when allowed to write response or provided mod/max cues Goal status: MET for 100% acc for mod/max cues (phonemic cue provided); change to 100% acc with mod cues.  2.  Pt will describe objects and pictures by providing at least three salient features (single words ok) as judged by clinician with 80% acc when provided mod cues. Baseline: 70% mod/max cues Goal status: MET for 100% with mod/max cues provided; change to 100% acc with mod cues.  3.  Pt will complete single word sentence completion tasks [facility]) with 90% acc when provided with min/mod multimodality cues.  Baseline: 75% mod cues of initial phoneme modeled Goal status: MET for 100% acc with mi/mod cues provided; change to 100% with min cues.  4.  Pt will complete basic level reading comprehension tasks (picture cues for sentence level) with 80% acc with provided  mi/mod cues. Baseline: 100% word to picture matching f=5, 0/5 sentences Goal status: MET for single words, continue for sentences  5.  Pt will verbally describe action photos with use of "who" "what doing" template with 80% acc and min/mod cues.  Baseline: 70% mod cues. Goal status: MET for 100% acc with mod/max cues; change to 100% with mod cues.  6.  Pt will complete personally relevant responsive naming tasks with 90% acc with use of written cues as needed. Baseline: 75% acc Goal status: MET  7. Pt will use a communication device to cue themselves to speak (reading or imitating words) during 1:1 conversations with SLP with min/mod cues over 3 consecutive sessions.  Baseline: Max assist  Goal status: ONGOING  8. Pt will respond to personal preference questions by answering with alternative communication device with 90% acc and min assist.  Baseline: Max assist  Goal status:  ONGOING  LONG TERM GOALS: Target date: 06/25/2023  Pt will communicate moderately complex wants/needs to [facility] with min assist from communication partner and use of multimodality communication strategies. Baseline: Pt requires mod assist from communication partner  Goal status: ONGOING   ASSESSMENT:  PREVIOUS TREATMENT:     03/26/23: Pt accompanied to therapy this date by her caregiver, Agustin Cree and her sister. They brought in a notebook and completed a good portion of the personalized communication template. SLP reviewed the information with Pt and Pt asked to read names of family members. Pt able to read 75% of the names and benefited from cues to look away/provide diversion and return to task when she could not read it right away. Family provided education on ways to help her with this at home. She was able to write the letters with her finger on the table 90% of the time and even orally spell when unable to say the word. Pt completed automatic speech tasks with 80% acc, but needed cues to decrease rate to increase accuracy of responses. Goals and evaluation were reviewed. Pt smiling and engaged throughout the session. She occasionally stated, "I can't do that", but with encouragement she tried to complete the request. Her sister commented that she seems to be able to communicate with some family members better than others and SLP suggested that she ascertain the content of conversation (ie. Harder to talk about personal needs with sister than lighter conversation with an aunt). Target above goals next session.   04/03/2023: Pt accompanied by caregiver, Agustin Cree and brought in her notebook. Pt completed confrontation naming task with 50% acc without cues and increased to 90% acc with cues (Pt able to use letter board to spell the word 90% of the time). She completed single word sentence completion (SWSC) task by providing accurate verbal response when looking at pictures with 80% acc with mi/mod cues.  She was then asked to repeat the word x3 and SLP interjected with confounds at times and Pt asked to return to word. This was very challenging for Pt with performance improved throughout trials. She was able to provide a gesture for each work with 100% acc. She answered responsive naming questions with use of personal communication template as needed with 95% acc. SLP provided strategies for accurate number production by having Pt count on her fingers and show the number on her hands and then verbalize the number, which appeared effective. Introduce the "Say it" set list of words next session.   04/09/2023: Pt accompanied by her caregiver, Darlene. Pt indicated that she went to the ED yesterday  due to chest pain and nose bleed. She currently feels much better. She completed confrontation naming of high frequency words with 50% acc, single word sentence completion task with 100% acc with min cues, responsive naming with 90% and mod cues, and elicited appropriate gesture for words with 100% acc with min assist. She was cued to use her personalized communication template to help find answers to responsive naming tasks. During confrontation naming tasks, she benefited from being able to write the word, but had difficulty saying the word without phonemic and sentence cues. New words were added to her communication template (purse, keys, phone, water, etc). Continue to target goals above and focus on responsive naming tasks for personal information.     04/14/2023: Pt accompanied by her caregiver, Darlene. She verbalized that she went to [place] over the weekend to see friends/family. She completed responsive naming tasks in conversation regarding personally relevant information with 90% acc with ~75% acc for verbal responses and 100% when allowed to write or use written cues. She completed confrontation naming task when looking at icons on a tablet with 70% acc with mod cues, but was able to repeat the name of the  item after model provided, with 90% acc. She was introduced to the [place] tablet today and she expressed interest in using it to help her communicate. She indicates that she thinks she has something similar at home and will bring in next session. She was able to quickly point to 7 fruits that she likes to eat and was able to repeat 6/7 after model provided. Update goals next session.       04/17/23:  Pt was accompanied by a new caregiver today because Darlene had an appointment. Pt was able to communicate this to SLP with use of written personalized template to identify the name "Darlene". Harriett Sine answered personally relevant responsive naming questions (regarding names of friends, family, and medical professionals) with 100% acc with use of communication support as needed and accurate verbal response 90% of the time with min cues. Pt verbalized a subject, verb/action, and object when looking at action photos with 92% acc with mi/mod cues and was then able to repeat with written model provided with 100% acc with min assist. She then completed single word sentence completion task with 100% acc with min cues. Jaziah occasionally shuts down when a task feels out of her reach or too challenging and needs encouragement to try the task. She was trying to verbalize "smelling" and produced "smoking" instead, but was able to break the perseveration and produce "smelling" on ~70% of cued trials. She was given divergent naming task with word bank provided for homework and completed trial x 2 before ending the session. Next session, continue to expansive naming tasks to generate sentences when looking at photos.   04/23/2023: Pt accompanied to therapy by Darlene. She completed responsive naming task by answering personally relevant questions with use of written cues provided with 100% acc. She is unable to verbalize responses independently without use of written cues or initial phoneme cue from SLP. She named common objects  with 100% acc when allowed to write her response or when given initial phoneme cue and she completed single word sentence completion tasks with 100% acc with provided mi/mod cues. She independently uses total communication strategies by gesturing, writing, or looking for written response when she is unable to verbalize her response. Unfortunately, she needs environmental supports via communication partner, written templates, gestures, and ability to hand write responses to  communicate her thoughts. Pt would make an excellent candidate for a dynamic communication system. We have tried some apps on her phone, however she would benefit from a dedicated speech generating device. Goals have been updated and will request a trial of a communication device. Pt is in agreement with plan of care. Requesting 2x/week for 4 more weeks.   04/28/23: Pt accompanied by a different caregiver today and she was able to locate her name by finding it in her contacts on her phone. She completed responsive naming tasks with 100% acc with use of communication template, described action pictures using subject and verb with 100% acc with mod cues, completed SWSC tasks with 100% acc with min cues, confrontation naming tasks for high frequency words with 100% acc with mod/max cues. She was able to repeat 5 word sentences with 100% acc when provided mi/mod cues from SLP. SLP submitted request for AAC device trials and Pt expressed excitement over having a more efficient way to help her communicate. Continue to target goals next session.   CURRENT TREATMENT: SLP submitted request to check insurance benefits for alternative communication device, awaiting response. Pt completed HEP. She used verbal responses, letter board, communication template, and self written responses to answer personally relevant responsive naming tasks with 94% acc with mi/mod use of non-verbal responses. She completed confrontation naming of high frequency pictures with  100% acc with mod cues. Pt expressed that mentally fatiguing for her to verbalize responses at times. SLP provided reassurance and positive reinforcement toward Pt gains this session. She continues to be motivated and uses alternative communication strategies (even able to spell words aloud when unable to verbalize the word) with indirect cues. Continue targeting goals and trials of AAC devices.                                                                                                                     DATE: 04/30/23  CLINICAL IMPRESSION: (from initial evaluation on 03/19/2023) Patient is a 57 y.o. female who was seen today for speech/language evaluation s/p CVA in May of 2021 with only limited follow up SLP therapy via home health in 2021 when she was staying with her daughter. [name] sister accompanied her to the evaluation and would like for her sister to be able to communicate her wants and needs verbally. 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 Pt has difficulty verbally labeling high frequency objects (but was able to write some responses), repeating longer words/phrases, and reading phrases. She was able to match 5/5 high frequency objects to single words, but was only able to orally read/label 1/5 words. Pt benefited from gesturing to communicate her intent and this occasionally helped elicit word production and she also benefited from SLP  phonemic cues and single word sentence completion cues. Recommend SLP therapy to address communication deficits and increase independence with communication 2x/week for 4-8 weeks.  OBJECTIVE IMPAIRMENTS: include executive  functioning, expressive language, receptive language, aphasia, and apraxia. These impairments are limiting patient from managing medications, managing appointments, managing finances, and effectively communicating at home and in community. Factors affecting potential to achieve goals and functional outcome are ability to learn/carryover information, severity of impairments, and time post onset of aphasia (2021) . Patient will benefit from skilled SLP services to address above impairments and improve overall function.  REHAB POTENTIAL: Good  PLAN:  SLP FREQUENCY: 2x/week  SLP DURATION: 8 weeks  PLANNED INTERVENTIONS: Cueing hierachy, Internal/external aids, Functional tasks, Multimodal communication approach, SLP instruction and feedback, Compensatory strategies, Patient/family education, and Re-evaluation    Thank you,  Havery Moros, CCC-SLP [phone]  Lisanne Ponce, CCC-SLP 04/30/2023, 10:21 AM

## 2023-05-07 ENCOUNTER — Encounter (HOSPITAL_COMMUNITY): Payer: 59 | Admitting: Speech Pathology

## 2023-05-07 ENCOUNTER — Telehealth (HOSPITAL_COMMUNITY): Payer: Self-pay | Admitting: Speech Pathology

## 2023-05-07 NOTE — Telephone Encounter (Signed)
Telephone Call:  Pt did not show for appointment, however phone call to sister indicated that she was not aware of the newly scheduled appointments. [name], [name] sister, will call on Friday or early next week to adjust appointments.  Thank you,  Havery Moros, CCC-SLP [phone]

## 2023-05-09 ENCOUNTER — Ambulatory Visit (INDEPENDENT_AMBULATORY_CARE_PROVIDER_SITE_OTHER): Payer: 59 | Admitting: Podiatry

## 2023-05-09 ENCOUNTER — Ambulatory Visit (INDEPENDENT_AMBULATORY_CARE_PROVIDER_SITE_OTHER): Payer: 59 | Admitting: Internal Medicine

## 2023-05-09 ENCOUNTER — Encounter: Payer: Self-pay | Admitting: Internal Medicine

## 2023-05-09 VITALS — BP 124/80 | HR 102 | Ht 67.0 in | Wt 236.0 lb

## 2023-05-09 DIAGNOSIS — N1831 Chronic kidney disease, stage 3a: Secondary | ICD-10-CM

## 2023-05-09 DIAGNOSIS — B351 Tinea unguium: Secondary | ICD-10-CM | POA: Diagnosis not present

## 2023-05-09 DIAGNOSIS — E1122 Type 2 diabetes mellitus with diabetic chronic kidney disease: Secondary | ICD-10-CM | POA: Diagnosis not present

## 2023-05-09 DIAGNOSIS — Z794 Long term (current) use of insulin: Secondary | ICD-10-CM

## 2023-05-09 DIAGNOSIS — E1143 Type 2 diabetes mellitus with diabetic autonomic (poly)neuropathy: Secondary | ICD-10-CM | POA: Diagnosis not present

## 2023-05-09 DIAGNOSIS — M79675 Pain in left toe(s): Secondary | ICD-10-CM | POA: Diagnosis not present

## 2023-05-09 DIAGNOSIS — E1159 Type 2 diabetes mellitus with other circulatory complications: Secondary | ICD-10-CM | POA: Diagnosis not present

## 2023-05-09 DIAGNOSIS — E1165 Type 2 diabetes mellitus with hyperglycemia: Secondary | ICD-10-CM | POA: Diagnosis not present

## 2023-05-09 DIAGNOSIS — M79674 Pain in right toe(s): Secondary | ICD-10-CM

## 2023-05-09 LAB — POCT GLYCOSYLATED HEMOGLOBIN (HGB A1C): Hemoglobin A1C: 8.8 % — AB (ref 4.0–5.6)

## 2023-05-09 MED ORDER — EMPAGLIFLOZIN 25 MG PO TABS: 25.0000 mg | ORAL_TABLET | Freq: Every day | ORAL | 3 refills | Status: DC

## 2023-05-09 MED ORDER — TRULICITY 3 MG/0.5ML ~~LOC~~ SOAJ: 3.0000 mg | SUBCUTANEOUS | 3 refills | Status: DC

## 2023-05-09 NOTE — Patient Instructions (Addendum)
-   Increase Trulicity  3 mg ONCE weekly  - Increase Jardiance 25 mg daily  - Continue Lantus  44  units daily  - Continue Novolog 12 units with Breakfast, Lunch and Dinner       HOW TO TREAT LOW BLOOD SUGARS (Blood sugar LESS THAN 70 MG/DL) Please follow the RULE OF 15 for the treatment of hypoglycemia treatment (when your (blood sugars are less than 70 mg/dL)   STEP 1: Take 15 grams of carbohydrates when your blood sugar is low, which includes:  3-4 GLUCOSE TABS  OR 3-4 OZ OF JUICE OR REGULAR SODA OR ONE TUBE OF GLUCOSE GEL    STEP 2: RECHECK blood sugar in 15 MINUTES STEP 3: If your blood sugar is still low at the 15 minute recheck --> then, go back to STEP 1 and treat AGAIN with another 15 grams of carbohydrates.

## 2023-05-09 NOTE — Progress Notes (Signed)
Name: Robin [name]  Age/ Sex: 57 y.o., female   MRN/ DOB: [id number], January 31, 1966     PCP: Jarold Motto, PA   Reason for Endocrinology Evaluation: Type 2 Diabetes Mellitus  Initial Endocrine Consultative Visit: 10/01/2018    PATIENT IDENTIFIER: Robin [name] is a 57 y.o. female with a past medical history of T2DM, CHF, COPD , CVA, A.Fib with RVR and GERD. The patient has followed with Endocrinology clinic since 10/01/2018 for consultative assistance with management of her diabetes.  DIABETIC HISTORY:  Robin [name] was diagnosed with DM > 20 yrs ago. Has been on Janumet and Glipizide in the past. Has been on insulin for years. Her hemoglobin A1c has ranged from 7.9% in 2017, peaking at 11.3% in 2020  Started trulicity 06/2021 Started  Jardiance 04/2022  SUBJECTIVE:   During the last visit (10/30/2022): A1c 7.5%  Today (05/09/2023): Robin [name] is here for a follow up on diabetes management.  She is accompanied by her sister today.  She checks glucose 23x  a day. No hypoglycemia    She had a follow-up with cardiology 04/28/2023 for A-fib, CHF, she presented to the ED with chest pain 04/25/2023, negative troponins  She continues to complain of chest pain, this is reproducible  Denies nausea or vomiting  Denies constipation , has occasional loose stools    She has been on lower dose of Trulicity due to backorder of the 3 mg   HOME DIABETES REGIMEN:  Jardiance 10 mg daily  Trulicity 3 mg weekly  Lantus 44 units daily NovoLog 12 units 3 times daily before every meal    METER DOWNLOAD SUMMARY: unable to download 90 day average 212 mg/dL    409- 811  mg/dL    DIABETIC COMPLICATIONS: Microvascular complications:  Neuropathy Denies: CKD, retinopathy  Last eye exam: Completed many years ago    Macrovascular complications:  CVA 01/2020 Denies: CAD, PVA    HISTORY:  Past Medical History:  Past Medical History:  Diagnosis Date   A-fib (HCC)    Abdominal aortic  aneurysm (AAA) (HCC)    Allergy    Anemia    Arthritis    Atrial fibrillation (HCC)    CHF (congestive heart failure) (HCC)    a. EF at 30-35% by echo in 08/2018 b. EF at 35% by repeat echo in 05/2019 c. EF improved to 65-70% in 2021 d. EF at 40-45% in 08/2021.   Chronic abdominal pain    Chronic atrial fibrillation (HCC)    Cocaine abuse (HCC)    COPD (chronic obstructive pulmonary disease) (HCC)    Diabetes mellitus without complication (HCC)    Essential hypertension, benign    Expressive aphasia    Expressive aphasia    post CVA   Fatty liver    GERD (gastroesophageal reflux disease)    Gout 2016   Normal coronary arteries    3/10 - following abnormal Myoview   Ovarian cyst    Stroke (HCC) 12/26/2019   Right sided weakness, and expressive aphasia   Stroke [facility])    Thoracic ascending aortic aneurysm (HCC)    4 cm 10/31/19 CTA   Type 2 diabetes mellitus (HCC)    type II   Past Surgical History:  Past Surgical History:  Procedure Laterality Date   ABDOMINAL HYSTERECTOMY  09/10/2011   Procedure: HYSTERECTOMY ABDOMINAL;  Surgeon: Tilda Burrow, MD;  Location: [facility];  Service: Gynecology;  Laterality: N/A;  Abdominal hysterectomy   CESAREAN SECTION  9147,8295, and  1994   CHOLECYSTECTOMY  1995   IR 3D INDEPENDENT WKST  03/16/2020   IR ANGIO INTRA EXTRACRAN SEL INTERNAL CAROTID BILAT MOD SED  03/16/2020   IR ANGIO VERTEBRAL SEL SUBCLAVIAN INNOMINATE UNI L MOD SED  03/16/2020   IR ANGIO VERTEBRAL SEL VERTEBRAL UNI R MOD SED  03/16/2020   IR CT HEAD LTD  12/27/2019   IR PERCUTANEOUS ART THROMBECTOMY/INFUSION INTRACRANIAL INC DIAG ANGIO  12/27/2019   IR US GUIDE VASC ACCESS RIGHT  12/27/2019   IR US GUIDE VASC ACCESS RIGHT  03/16/2020   RADIOLOGY WITH ANESTHESIA N/A 12/27/2019   Procedure: IR WITH ANESTHESIA;  Surgeon: Julieanne Cotton, MD;  Location: [facility];  Service: Radiology;  Laterality: N/A;   SCAR REVISION  09/10/2011   Procedure: SCAR REVISION;  Surgeon: Tilda Burrow, MD;   Location: [facility];  Service: Gynecology;  Laterality: N/A;  Wide Excision of old Cicatrix   TUBAL LIGATION  1994   Social History:  reports that she quit smoking about 3 years ago. Her smoking use included cigarettes. She started smoking about 32 years ago. She has never used smokeless tobacco. She reports that she does not currently use alcohol. She reports that she does not currently use drugs. Family History:  Family History  Problem Relation Age of Onset   Cirrhosis Mother    Early death Mother    Alcohol abuse Mother    Diabetes type II Father    Alcohol abuse Father    Diabetes type II Sister    Early death Brother    Alcohol abuse Son    Anesthesia problems Neg Hx    Hypotension Neg Hx    Malignant hyperthermia Neg Hx    Pseudochol deficiency Neg Hx    Colon cancer Neg Hx    Colon polyps Neg Hx    Esophageal cancer Neg Hx    Rectal cancer Neg Hx    Stomach cancer Neg Hx      HOME MEDICATIONS: Allergies as of 05/09/2023       Reactions   Bee Venom Shortness Of Breath, Swelling   Bodily Swelling   Losartan Other (See Comments)   Nosebleeds per patient report.    Naproxen Other (See Comments)   Acid reflux   Penicillins Nausea Only   Lisinopril    Sinus congestion        Medication List        Accurate as of May 09, 2023  9:18 AM. If you have any questions, ask your nurse or doctor.          Accu-Chek Guide Me w/Device Kit 1 Device by Does not apply route 3 (three) times daily.   Accu-Chek Guide test strip Generic drug: glucose blood USE 1 STRIP TO CHECK GLUCOSE THREE TIMES DAILY   Accu-Chek Softclix Lancets lancets Use as instructed   aspirin EC 81 MG tablet Take 1 tablet (81 mg total) by mouth daily. Swallow whole.   BD Pen Needle Nano U/F 32G X 4 MM Misc Generic drug: Insulin Pen Needle Inject 1 Device into the skin in the morning, at noon, in the evening, and at bedtime.   carvedilol 25 MG tablet Commonly known as: COREG TAKE 2 TABLETS  BY MOUTH TWICE DAILY WITH A MEAL   cetaphil lotion Apply 1 Application topically as needed for dry skin.   Dexcom G6 Sensor Misc 1 Device by Does not apply route as directed.   Dexcom G6 Transmitter Misc 1 Device by Does not  apply route as directed.   dicyclomine 20 MG tablet Commonly known as: BENTYL Take 1 tablet (20 mg total) by mouth 2 (two) times daily as needed for spasms.   Eliquis 5 MG Tabs tablet Generic drug: apixaban Take 1 tablet by mouth twice daily   empagliflozin 10 MG Tabs tablet Commonly known as: Jardiance Take 1 tablet (10 mg total) by mouth daily before breakfast.   furosemide 20 MG tablet Commonly known as: LASIX TAKE 1 TABLET BY MOUTH ONCE DAILY (MAY TAKE ADDITIONAL 20 MG IF WEIGHT GAIN IS OVER 2LBS OVERNIGHT OR 5 LBS IN A WEEK)   gabapentin 100 MG capsule Commonly known as: NEURONTIN TAKE 2 CAPSULES BY MOUTH THREE TIMES DAILY   hydrALAZINE 25 MG tablet Commonly known as: APRESOLINE TAKE 1 TABLET BY MOUTH THREE TIMES DAILY   Iron 325 (65 Fe) MG Tabs Take 1 tablet by mouth once daily   isosorbide mononitrate 30 MG 24 hr tablet Commonly known as: IMDUR Take 2 tablets by mouth once daily   Lantus SoloStar 100 UNIT/ML Solostar Pen Generic drug: insulin glargine Inject 44 Units into the skin daily.   NovoLOG FlexPen 100 UNIT/ML FlexPen Generic drug: insulin aspart Inject 12 Units into the skin 3 (three) times daily with meals.   pantoprazole 40 MG tablet Commonly known as: PROTONIX Take 1 tablet by mouth once daily   timolol 0.5 % ophthalmic solution Commonly known as: TIMOPTIC Place 1 drop into both eyes 2 (two) times daily.   tobramycin 0.3 % ophthalmic solution Commonly known as: TOBREX 1 drop 4 (four) times daily.   Trulicity 1.5 MG/0.5ML Sopn Generic drug: Dulaglutide Inject 1.5 mg into the skin once a week.         OBJECTIVE:   Vital Signs: BP 124/80 (BP Location: Left Arm, Patient Position: Sitting, Cuff Size: Large)    Pulse (!) 102   Ht 5\' 7"  (1.702 m)   Wt 236 lb (107 kg)   LMP 08/21/2011   SpO2 94%   BMI 36.96 kg/m   Wt Readings from Last 3 Encounters:  05/09/23 236 lb (107 kg)  04/28/23 237 lb 6.4 oz (107.7 kg)  04/08/23 233 lb 11 oz (106 kg)     Exam: General: Pt appears well and is in NAD  Lungs: Clear with good BS bilat   Heart: RRR   Extremities: No pretibial edema.   Neuro: MS is good with appropriate affect, pt is alert and Ox3    DM foot exam:10/30/2022  The skin of the feet is intact without sores or ulcerations. The pedal pulses are 2+ The sensation is decreased  to a screening 5.07, 10 gram monofilament bilaterally        DATA REVIEWED:  Lab Results  Component Value Date   HGBA1C 8.2 (H) 12/02/2022   HGBA1C 7.5 (A) 10/30/2022   HGBA1C 9.6 (A) 05/01/2022    Latest Reference Range & Units 04/08/23 14:53  Sodium 135 - 145 mmol/L 136  Potassium 3.5 - 5.1 mmol/L 3.8  Chloride 98 - 111 mmol/L 105  CO2 22 - 32 mmol/L 23  Glucose 70 - 99 mg/dL 045 (H)  BUN 6 - 20 mg/dL 23 (H)  Creatinine 4.09 - 1.00 mg/dL 8.11 (H)  Calcium 8.9 - 10.3 mg/dL 8.4 (L)  Anion gap 5 - 15  8  GFR, Estimated >60 mL/min 33 (L)  (H): Data is abnormally high (L): Data is abnormally low    ASSESSMENT / PLAN / RECOMMENDATIONS:   1) Type  2 Diabetes Mellitus, Poorly controlled, With Neuropathic and CKD III  and macrovascular complications - Most recent A1c of  8.8  %. Goal A1c < 7.0 %.    -Patient has been noted with hypoglycemia -This is partly due to being on less dose of Trulicity due to shortage of supplies of the 3 mg dose, will increase -I will increase the Jardiance as below -Metformin has been stopped due to low GFR -Main barriers to diabetes self care is memory issues and CVA -She was able to obtain the Dexcom  MEDICATIONS: Continue  Lantus  44 units daily Continue NovoLog 12 units with each meal Increase trulicity 3 mg weekly  Increase Jardiance 25 mg daily  EDUCATION /  INSTRUCTIONS: BG monitoring instructions: Patient is instructed to check her blood sugars 2 times a day, fasting and supper. Call [place] [facility] if: BG persistently < 70  I reviewed the Rule of 15 for the treatment of hypoglycemia in detail with the patient. Literature supplied.   2) Diabetic complications:  Eye: Does not have known diabetic retinopathy.   Neuro/ Feet: Does  have known diabetic peripheral neuropathy .  Renal: Patient does  have known baseline CKD      3) Chest pain :  -She was evaluated by cardiology recently, negative workup -This is reproducible on today's exam, musculoskeletal in nature -Patient to take Tylenol and heating pad, which persisted with the same recommendations from the cardiologist  F/U in 4 months    Signed electronically by: Lyndle Herrlich, MD  [facility]  [facility] [address] [place] [place], [name] [address] Phone: [phone] FAX: [phone]   CC: Jarold Motto, [place] [address] [name] [address] Phone: [phone]  Fax: [phone]  Return to Endocrinology clinic as below: Future Appointments  Date Time Provider Department Center  05/09/2023 10:00 AM Candelaria Stagers, DPM [facility]  05/21/2023  8:45 AM Dorene Ar, CCC-SLP [facility] None  05/28/2023  8:45 AM Dorene Ar, CCC-SLP [facility] None  06/30/2023  1:30 PM Creig Hines, NP [facility]

## 2023-05-09 NOTE — Progress Notes (Signed)
Subjective:  Patient ID: Robin [name], female    DOB: Jan 24, 1966,  MRN: [id number]  Chief Complaint  Patient presents with   Nail Problem   57 y.o. female returns for the above complaint.  Patient presents with thickened onychodystrophy mycotic toenails x 10 marked with ambulation is with pressure she has not seen [place] prior to seeing me denies any other acute complaints.  Objective:  There were no vitals filed for this visit. Podiatric Exam: Vascular: dorsalis pedis and posterior tibial pulses are palpable bilateral. Capillary return is immediate. Temperature gradient is WNL. Skin turgor WNL  Sensorium: Normal Semmes Weinstein monofilament test. Normal tactile sensation bilaterally. Nail Exam: Pt has thick disfigured discolored nails with subungual debris noted bilateral entire nail hallux through fifth toenails.  Pain on palpation to the nails. Ulcer Exam: There is no evidence of ulcer or pre-ulcerative changes or infection. Orthopedic Exam: Muscle tone and strength are WNL. No limitations in general ROM. No crepitus or effusions noted.  Skin: No Porokeratosis. No infection or ulcers    Assessment & Plan:  No diagnosis found.  Patient was evaluated and treated and all questions answered.  Onychomycosis with pain  -Nails palliatively debrided as below. -Educated on self-care  Procedure: Nail Debridement Rationale: pain  Type of Debridement: manual, sharp debridement. Instrumentation: Nail nipper, rotary burr. Number of Nails: 10  Procedures and Treatment: Consent by patient was obtained for treatment procedures. The patient understood the discussion of treatment and procedures well. All questions were answered thoroughly reviewed. Debridement of mycotic and hypertrophic toenails, 1 through 5 bilateral and clearing of subungual debris. No ulceration, no infection noted.  Return Visit-Office Procedure: Patient instructed to return to the office for a follow up visit 3 months for  continued evaluation and treatment.  Nicholes Rough, DPM    No follow-ups on file.

## 2023-05-21 ENCOUNTER — Encounter (HOSPITAL_COMMUNITY): Payer: Self-pay | Admitting: Speech Pathology

## 2023-05-21 ENCOUNTER — Ambulatory Visit (HOSPITAL_COMMUNITY): Payer: 59 | Attending: Physician Assistant | Admitting: Speech Pathology

## 2023-05-21 DIAGNOSIS — R41841 Cognitive communication deficit: Secondary | ICD-10-CM | POA: Diagnosis present

## 2023-05-21 DIAGNOSIS — R4701 Aphasia: Secondary | ICD-10-CM | POA: Diagnosis present

## 2023-05-21 DIAGNOSIS — I69890 Apraxia following other cerebrovascular disease: Secondary | ICD-10-CM | POA: Diagnosis present

## 2023-05-21 NOTE — Therapy (Signed)
OUTPATIENT SPEECH LANGUAGE PATHOLOGY TREATMENT   Patient Name: Robin [name] MRN: [id number] DOB:1966/02/17, 57 y.o., female Today's Date: 05/21/2023  PCP: Jarold Motto, PA REFERRING PROVIDER: Jarold Motto, PA  END OF SESSION:  End of Session - 05/21/23 1232     Visit Number 10    Number of Visits 16    Date for SLP Re-Evaluation 05/29/23    Authorization Type [employer] Medicare and Medicaid secondary; Dual complete   eff 08/05/22 ded 240 met oop 8850 met 2034.20 limit-no auth-no co ins-20%   SLP Start Time 0858    SLP Stop Time  0940    SLP Time Calculation (min) 42 min    Activity Tolerance Patient tolerated treatment well             Past Medical History:  Diagnosis Date   A-fib (HCC)    Abdominal aortic aneurysm (AAA) (HCC)    Allergy    Anemia    Arthritis    Atrial fibrillation (HCC)    CHF (congestive heart failure) (HCC)    a. EF at 30-35% by echo in 08/2018 b. EF at 35% by repeat echo in 05/2019 c. EF improved to 65-70% in 2021 d. EF at 40-45% in 08/2021.   Chronic abdominal pain    Chronic atrial fibrillation (HCC)    Cocaine abuse (HCC)    COPD (chronic obstructive pulmonary disease) (HCC)    Diabetes mellitus without complication (HCC)    Essential hypertension, benign    Expressive aphasia    Expressive aphasia    post CVA   Fatty liver    GERD (gastroesophageal reflux disease)    Gout 2016   Normal coronary arteries    3/10 - following abnormal Myoview   Ovarian cyst    Stroke (HCC) 12/26/2019   Right sided weakness, and expressive aphasia   Stroke [facility])    Thoracic ascending aortic aneurysm (HCC)    4 cm 10/31/19 CTA   Type 2 diabetes mellitus (HCC)    type II   Past Surgical History:  Procedure Laterality Date   ABDOMINAL HYSTERECTOMY  09/10/2011   Procedure: HYSTERECTOMY ABDOMINAL;  Surgeon: Tilda Burrow, MD;  Location: [facility];  Service: Gynecology;  Laterality: N/A;  Abdominal hysterectomy   CESAREAN SECTION  [phone], and 1994    CHOLECYSTECTOMY  1995   IR 3D INDEPENDENT WKST  03/16/2020   IR ANGIO INTRA EXTRACRAN SEL INTERNAL CAROTID BILAT MOD SED  03/16/2020   IR ANGIO VERTEBRAL SEL SUBCLAVIAN INNOMINATE UNI L MOD SED  03/16/2020   IR ANGIO VERTEBRAL SEL VERTEBRAL UNI R MOD SED  03/16/2020   IR CT HEAD LTD  12/27/2019   IR PERCUTANEOUS ART THROMBECTOMY/INFUSION INTRACRANIAL INC DIAG ANGIO  12/27/2019   IR US GUIDE VASC ACCESS RIGHT  12/27/2019   IR US GUIDE VASC ACCESS RIGHT  03/16/2020   RADIOLOGY WITH ANESTHESIA N/A 12/27/2019   Procedure: IR WITH ANESTHESIA;  Surgeon: Julieanne Cotton, MD;  Location: [facility];  Service: Radiology;  Laterality: N/A;   SCAR REVISION  09/10/2011   Procedure: SCAR REVISION;  Surgeon: Tilda Burrow, MD;  Location: [facility];  Service: Gynecology;  Laterality: N/A;  Wide Excision of old Cicatrix   TUBAL LIGATION  1994   Patient Active Problem List   Diagnosis Date Noted   Acute on chronic systolic CHF (congestive heart failure) (HCC) 11/27/2021   Stage 3b chronic kidney disease (CKD) (HCC) 11/27/2021   Microcytic anemia 11/27/2021   Lactic acidosis 11/27/2021  Diabetes mellitus (HCC) 03/09/2021   Type 2 diabetes mellitus with stage 3a chronic kidney disease, with long-term current use of insulin (HCC) 03/09/2021   Acute metabolic encephalopathy 12/31/2020   Hyperglycemia 12/31/2020   Prolonged QT interval 05/12/2020   History of CVA with residual deficit 05/12/2020   Atrial fibrillation, chronic (HCC) 05/12/2020   Right hemiparesis (HCC)    Essential hypertension    Chronic diastolic congestive heart failure (HCC)    Dysphagia due to recent stroke 01/05/2020   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 01/05/2020   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/p clot retrieval 12/27/2019   Thoracic aortic aneurysm without rupture (HCC)    Atrial fibrillation with RVR (HCC) 05/10/2019   Type 2 diabetes mellitus with diabetic autonomic neuropathy, with long-term current use of insulin  (HCC) 09/01/2018   Cocaine abuse (HCC) 08/25/2018   Cigarette nicotine dependence, uncomplicated 06/07/2015   GERD (gastroesophageal reflux disease) 06/07/2011   COPD (chronic obstructive pulmonary disease) (HCC) 06/07/2011   Arthritis 06/07/2011    ONSET DATE: May 2021   REFERRING DIAG: R47.01 (ICD-10-CM) - Expressive aphasia  THERAPY DIAG:  Aphasia  Apraxia following other cerebrovascular disease  Cognitive communication deficit  Rationale for Evaluation and Treatment: Rehabilitation  SUBJECTIVE:   SUBJECTIVE STATEMENT: "It's cold!"  Pt accompanied by: self and family member  PERTINENT HISTORY: Robin [name] is a pleasant 57 y.o. female with medical history significant for atrial fibrillation on Eliquis, chronic systolic CHF, CKD 3B, insulin-dependent diabetes mellitus, polysubstance abuse, COPD, GERD, HTN, and history of CVA with residual right-sided weakness and aphasia (L MCA CVA and L ICA occlusion s/p thrombectomy on 12/27/2019 followed by CIR stay where she received ST/OT/PT until d/c home on 02/02/20 and then received some [facility] SLP services). She is referred for outpatient SLP therapy by Jarold Motto, PA to facilitate increased independence with communication.  PAIN:  Are you having pain? No  PATIENT GOALS: Increase ability to talk  OBJECTIVE:   DIAGNOSTIC FINDINGS:  MRI 11/13/2020: IMPRESSION: No evidence of acute intracranial abnormality, including acute infarction.   Redemonstrated large chronic left MCA territory infarct.   Redemonstrated small chronic cortically based infarct within the posterior right temporal lobe.   Mild bilateral ethmoid sinus mucosal thickening.  PATIENT EDUCATION: Education details: Practice the "Say it, Write it, Read it" HEP provided with pictures [place] educated: Patient and sister, [name] Education method: Explanation and Handouts Education comprehension: verbalized understanding and needs further education   GOALS: Goals  reviewed with patient? Yes  SHORT TERM GOALS: Target date: 05/28/2023  Pt will increase verbal naming of common objects/pictures to 75% acc when provided with mod multimodality cues Baseline: 80% when allowed to write response or provided mod/max cues Goal status: MET for 100% acc for mod/max cues (phonemic cue provided); change to 100% acc with mod cues.  2.  Pt will describe objects and pictures by providing at least three salient features (single words ok) as judged by clinician with 80% acc when provided mod cues. Baseline: 70% mod/max cues Goal status: MET for 100% with mod/max cues provided; change to 100% acc with mod cues.  3.  Pt will complete single word sentence completion tasks [facility]) with 90% acc when provided with min/mod multimodality cues.  Baseline: 75% mod cues of initial phoneme modeled Goal status: MET for 100% acc with mi/mod cues provided; change to 100% with min cues.  4.  Pt will complete basic level reading comprehension tasks (picture cues for sentence level) with 80% acc with  provided mi/mod cues. Baseline: 100% word to picture matching f=5, 0/5 sentences Goal status: MET for single words, continue for sentences  5.  Pt will verbally describe action photos with use of "who" "what doing" template with 80% acc and min/mod cues.  Baseline: 70% mod cues. Goal status: MET for 100% acc with mod/max cues; change to 100% with mod cues.  6.  Pt will complete personally relevant responsive naming tasks with 90% acc with use of written cues as needed. Baseline: 75% acc Goal status: MET  7. Pt will use a communication device to cue themselves to speak (reading or imitating words) during 1:1 conversations with SLP with min/mod cues over 3 consecutive sessions.  Baseline: Max assist  Goal status: ONGOING  8. Pt will respond to personal preference questions by answering with alternative communication device with 90% acc and min assist.  Baseline: Max assist  Goal status:  ONGOING  LONG TERM GOALS: Target date: 06/25/2023  Pt will communicate moderately complex wants/needs to [facility] with min assist from communication partner and use of multimodality communication strategies. Baseline: Pt requires mod assist from communication partner  Goal status: ONGOING   ASSESSMENT:  PREVIOUS TREATMENT:     03/26/23: Pt accompanied to therapy this date by her caregiver, Agustin Cree and her sister. They brought in a notebook and completed a good portion of the personalized communication template. SLP reviewed the information with Pt and Pt asked to read names of family members. Pt able to read 75% of the names and benefited from cues to look away/provide diversion and return to task when she could not read it right away. Family provided education on ways to help her with this at home. She was able to write the letters with her finger on the table 90% of the time and even orally spell when unable to say the word. Pt completed automatic speech tasks with 80% acc, but needed cues to decrease rate to increase accuracy of responses. Goals and evaluation were reviewed. Pt smiling and engaged throughout the session. She occasionally stated, "I can't do that", but with encouragement she tried to complete the request. Her sister commented that she seems to be able to communicate with some family members better than others and SLP suggested that she ascertain the content of conversation (ie. Harder to talk about personal needs with sister than lighter conversation with an aunt). Target above goals next session.   04/03/2023: Pt accompanied by caregiver, Agustin Cree and brought in her notebook. Pt completed confrontation naming task with 50% acc without cues and increased to 90% acc with cues (Pt able to use letter board to spell the word 90% of the time). She completed single word sentence completion (SWSC) task by providing accurate verbal response when looking at pictures with 80% acc with mi/mod cues.  She was then asked to repeat the word x3 and SLP interjected with confounds at times and Pt asked to return to word. This was very challenging for Pt with performance improved throughout trials. She was able to provide a gesture for each work with 100% acc. She answered responsive naming questions with use of personal communication template as needed with 95% acc. SLP provided strategies for accurate number production by having Pt count on her fingers and show the number on her hands and then verbalize the number, which appeared effective. Introduce the "Say it" set list of words next session.   04/09/2023: Pt accompanied by her caregiver, Darlene. Pt indicated that she went to the ED  yesterday due to chest pain and nose bleed. She currently feels much better. She completed confrontation naming of high frequency words with 50% acc, single word sentence completion task with 100% acc with min cues, responsive naming with 90% and mod cues, and elicited appropriate gesture for words with 100% acc with min assist. She was cued to use her personalized communication template to help find answers to responsive naming tasks. During confrontation naming tasks, she benefited from being able to write the word, but had difficulty saying the word without phonemic and sentence cues. New words were added to her communication template (purse, keys, phone, water, etc). Continue to target goals above and focus on responsive naming tasks for personal information.     04/14/2023: Pt accompanied by her caregiver, Darlene. She verbalized that she went to [place] over the weekend to see friends/family. She completed responsive naming tasks in conversation regarding personally relevant information with 90% acc with ~75% acc for verbal responses and 100% when allowed to write or use written cues. She completed confrontation naming task when looking at icons on a tablet with 70% acc with mod cues, but was able to repeat the name of the  item after model provided, with 90% acc. She was introduced to the [place] tablet today and she expressed interest in using it to help her communicate. She indicates that she thinks she has something similar at home and will bring in next session. She was able to quickly point to 7 fruits that she likes to eat and was able to repeat 6/7 after model provided. Update goals next session.       04/17/23:  Pt was accompanied by a new caregiver today because Darlene had an appointment. Pt was able to communicate this to SLP with use of written personalized template to identify the name "Darlene". Harriett Sine answered personally relevant responsive naming questions (regarding names of friends, family, and medical professionals) with 100% acc with use of communication support as needed and accurate verbal response 90% of the time with min cues. Pt verbalized a subject, verb/action, and object when looking at action photos with 92% acc with mi/mod cues and was then able to repeat with written model provided with 100% acc with min assist. She then completed single word sentence completion task with 100% acc with min cues. Beedie occasionally shuts down when a task feels out of her reach or too challenging and needs encouragement to try the task. She was trying to verbalize "smelling" and produced "smoking" instead, but was able to break the perseveration and produce "smelling" on ~70% of cued trials. She was given divergent naming task with word bank provided for homework and completed trial x 2 before ending the session. Next session, continue to expansive naming tasks to generate sentences when looking at photos.   04/23/2023: Pt accompanied to therapy by Darlene. She completed responsive naming task by answering personally relevant questions with use of written cues provided with 100% acc. She is unable to verbalize responses independently without use of written cues or initial phoneme cue from SLP. She named common objects  with 100% acc when allowed to write her response or when given initial phoneme cue and she completed single word sentence completion tasks with 100% acc with provided mi/mod cues. She independently uses total communication strategies by gesturing, writing, or looking for written response when she is unable to verbalize her response. Unfortunately, she needs environmental supports via communication partner, written templates, gestures, and ability to hand write responses  to communicate her thoughts. Pt would make an excellent candidate for a dynamic communication system. We have tried some apps on her phone, however she would benefit from a dedicated speech generating device. Goals have been updated and will request a trial of a communication device. Pt is in agreement with plan of care. Requesting 2x/week for 4 more weeks.   04/28/23: Pt accompanied by a different caregiver today and she was able to locate her name by finding it in her contacts on her phone. She completed responsive naming tasks with 100% acc with use of communication template, described action pictures using subject and verb with 100% acc with mod cues, completed SWSC tasks with 100% acc with min cues, confrontation naming tasks for high frequency words with 100% acc with mod/max cues. She was able to repeat 5 word sentences with 100% acc when provided mi/mod cues from SLP. SLP submitted request for AAC device trials and Pt expressed excitement over having a more efficient way to help her communicate. Continue to target goals next session.   04/30/2023: SLP submitted request to check insurance benefits for alternative communication device, awaiting response. Pt completed HEP. She used verbal responses, letter board, communication template, and self written responses to answer personally relevant responsive naming tasks with 94% acc with mi/mod use of non-verbal responses. She completed confrontation naming of high frequency pictures with 100% acc  with mod cues. Pt expressed that mentally fatiguing for her to verbalize responses at times. SLP provided reassurance and positive reinforcement toward Pt gains this session. She continues to be motivated and uses alternative communication strategies (even able to spell words aloud when unable to verbalize the word) with indirect cues. Continue targeting goals and trials of AAC devices.   CURRENT TREATMENT: Pt unable to attend therapy in the last few weeks due to scheduling conflicts with her ability to obtain transportation. She was accompanied to today's visit by her sister and her new caregiver. SLP introduced the [place] communication device today. Pt able to turn the device on and select icons with cues from SLP (orientation factors and the specific folders). SLP opened the page to breakfast items and Pt selected 10 items she enjoys eating and was encouraged to use the device to cue herself to speak if unable to verbalize the word. She assisted SLP in adding personally relevant information to her communication template that she would like to have on the device. Her schedule has been updated and she will be able to attend more consistently and expressed feeling positive about using a speech generating device to assist with communication. Device will need further personalization before we begin a trial.                                                                                                                     DATE: 05/21/23  CLINICAL IMPRESSION: (from initial evaluation on 03/19/2023) Patient is a 57 y.o. female who was seen today for speech/language evaluation s/p CVA in May of 2021 with only  limited follow up SLP therapy via home health in 2021 when she was staying with her daughter. [name] sister accompanied her to the evaluation and would like for her sister to be able to communicate her wants and needs verbally. Pt presents with moderate expressive aphasia and suspect apraxia and mi/mod  receptive aphasia with relative strengths in her ability to write single word responses during naming tasks, use of gestures, automatic speech tasks, repetition of single, high frequency words, responding accurately to basic yes/no questions, following basic 1 step directions, and object recognition. Pt has difficulty verbally labeling high frequency objects (but was able to write some responses), repeating longer words/phrases, and reading phrases. She was able to match 5/5 high frequency objects to single words, but was only able to orally read/label 1/5 words. Pt benefited from gesturing to communicate her intent and this occasionally helped elicit word production and she also benefited from SLP phonemic cues and single word sentence completion cues. Recommend SLP therapy to address communication deficits and increase independence with communication 2x/week for 4-8 weeks.  OBJECTIVE IMPAIRMENTS: include executive functioning, expressive language, receptive language, aphasia, and apraxia. These impairments are limiting patient from managing medications, managing appointments, managing finances, and effectively communicating at home and in community. Factors affecting potential to achieve goals and functional outcome are ability to learn/carryover information, severity of impairments, and time post onset of aphasia (2021) . Patient will benefit from skilled SLP services to address above impairments and improve overall function.  REHAB POTENTIAL: Good  PLAN:  SLP FREQUENCY: 2x/week  SLP DURATION: 8 weeks  PLANNED INTERVENTIONS: Cueing hierachy, Internal/external aids, Functional tasks, Multimodal communication approach, SLP instruction and feedback, Compensatory strategies, Patient/family education, and Re-evaluation    Thank you,  Havery Moros, CCC-SLP [phone]  Evalene Vath, CCC-SLP 05/21/2023, 12:33 PM

## 2023-05-26 ENCOUNTER — Encounter (HOSPITAL_COMMUNITY): Payer: Self-pay | Admitting: Speech Pathology

## 2023-05-26 ENCOUNTER — Ambulatory Visit (HOSPITAL_COMMUNITY): Payer: 59 | Admitting: Speech Pathology

## 2023-05-26 DIAGNOSIS — R4701 Aphasia: Secondary | ICD-10-CM

## 2023-05-26 DIAGNOSIS — R41841 Cognitive communication deficit: Secondary | ICD-10-CM

## 2023-05-26 DIAGNOSIS — I69890 Apraxia following other cerebrovascular disease: Secondary | ICD-10-CM

## 2023-05-26 NOTE — Therapy (Signed)
OUTPATIENT SPEECH LANGUAGE PATHOLOGY TREATMENT   Patient Name: Robin [name] MRN: [id number] DOB:May 25, 1966, 57 y.o., female Today's Date: 05/26/2023  PCP: Jarold Motto, PA REFERRING PROVIDER: Jarold Motto, PA  END OF SESSION:  End of Session - 05/26/23 0924     Visit Number 11    Number of Visits 16    Date for SLP Re-Evaluation 05/29/23    Authorization Type U[employer] Medicare and Medicaid secondary; Dual complete   eff 08/05/22 ded 240 met oop 8850 met 2034.20 limit-no auth-no co ins-20%   SLP Start Time 0915    SLP Stop Time  1000    SLP Time Calculation (min) 45 min    Activity Tolerance Patient tolerated treatment well             Past Medical History:  Diagnosis Date   A-fib (HCC)    Abdominal aortic aneurysm (AAA) (HCC)    Allergy    Anemia    Arthritis    Atrial fibrillation (HCC)    CHF (congestive heart failure) (HCC)    a. EF at 30-35% by echo in 08/2018 b. EF at 35% by repeat echo in 05/2019 c. EF improved to 65-70% in 2021 d. EF at 40-45% in 08/2021.   Chronic abdominal pain    Chronic atrial fibrillation (HCC)    Cocaine abuse (HCC)    COPD (chronic obstructive pulmonary disease) (HCC)    Diabetes mellitus without complication (HCC)    Essential hypertension, benign    Expressive aphasia    Expressive aphasia    post CVA   Fatty liver    GERD (gastroesophageal reflux disease)    Gout 2016   Normal coronary arteries    3/10 - following abnormal Myoview   Ovarian cyst    Stroke (HCC) 12/26/2019   Right sided weakness, and expressive aphasia   Stroke [facility])    Thoracic ascending aortic aneurysm (HCC)    4 cm 10/31/19 CTA   Type 2 diabetes mellitus (HCC)    type II   Past Surgical History:  Procedure Laterality Date   ABDOMINAL HYSTERECTOMY  09/10/2011   Procedure: HYSTERECTOMY ABDOMINAL;  Surgeon: Tilda Burrow, MD;  Location: [facility];  Service: Gynecology;  Laterality: N/A;  Abdominal hysterectomy   CESAREAN SECTION  [phone], and 1994    CHOLECYSTECTOMY  1995   IR 3D INDEPENDENT WKST  03/16/2020   IR ANGIO INTRA EXTRACRAN SEL INTERNAL CAROTID BILAT MOD SED  03/16/2020   IR ANGIO VERTEBRAL SEL SUBCLAVIAN INNOMINATE UNI L MOD SED  03/16/2020   IR ANGIO VERTEBRAL SEL VERTEBRAL UNI R MOD SED  03/16/2020   IR CT HEAD LTD  12/27/2019   IR PERCUTANEOUS ART THROMBECTOMY/INFUSION INTRACRANIAL INC DIAG ANGIO  12/27/2019   IR US GUIDE VASC ACCESS RIGHT  12/27/2019   IR US GUIDE VASC ACCESS RIGHT  03/16/2020   RADIOLOGY WITH ANESTHESIA N/A 12/27/2019   Procedure: IR WITH ANESTHESIA;  Surgeon: Julieanne Cotton, MD;  Location: [facility];  Service: Radiology;  Laterality: N/A;   SCAR REVISION  09/10/2011   Procedure: SCAR REVISION;  Surgeon: Tilda Burrow, MD;  Location: [facility];  Service: Gynecology;  Laterality: N/A;  Wide Excision of old Cicatrix   TUBAL LIGATION  1994   Patient Active Problem List   Diagnosis Date Noted   Acute on chronic systolic CHF (congestive heart failure) (HCC) 11/27/2021   Stage 3b chronic kidney disease (CKD) (HCC) 11/27/2021   Microcytic anemia 11/27/2021   Lactic acidosis 11/27/2021  Diabetes mellitus (HCC) 03/09/2021   Type 2 diabetes mellitus with stage 3a chronic kidney disease, with long-term current use of insulin (HCC) 03/09/2021   Acute metabolic encephalopathy 12/31/2020   Hyperglycemia 12/31/2020   Prolonged QT interval 05/12/2020   History of CVA with residual deficit 05/12/2020   Atrial fibrillation, chronic (HCC) 05/12/2020   Right hemiparesis (HCC)    Essential hypertension    Chronic diastolic congestive heart failure (HCC)    Dysphagia due to recent stroke 01/05/2020   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 01/05/2020   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/p clot retrieval 12/27/2019   Thoracic aortic aneurysm without rupture (HCC)    Atrial fibrillation with RVR (HCC) 05/10/2019   Type 2 diabetes mellitus with diabetic autonomic neuropathy, with long-term current use of insulin  (HCC) 09/01/2018   Cocaine abuse (HCC) 08/25/2018   Cigarette nicotine dependence, uncomplicated 06/07/2015   GERD (gastroesophageal reflux disease) 06/07/2011   COPD (chronic obstructive pulmonary disease) (HCC) 06/07/2011   Arthritis 06/07/2011    ONSET DATE: May 2021   REFERRING DIAG: R47.01 (ICD-10-CM) - Expressive aphasia  THERAPY DIAG:  Aphasia  Apraxia following other cerebrovascular disease  Cognitive communication deficit  Rationale for Evaluation and Treatment: Rehabilitation  SUBJECTIVE:   SUBJECTIVE STATEMENT: "I like Gospel."  Pt accompanied by: self and family member  PERTINENT HISTORY: Robin [name] is a pleasant 57 y.o. female with medical history significant for atrial fibrillation on Eliquis, chronic systolic CHF, CKD 3B, insulin-dependent diabetes mellitus, polysubstance abuse, COPD, GERD, HTN, and history of CVA with residual right-sided weakness and aphasia (L MCA CVA and L ICA occlusion s/p thrombectomy on 12/27/2019 followed by CIR stay where she received ST/OT/PT until d/c home on 02/02/20 and then received some [facility] SLP services). She is referred for outpatient SLP therapy by Jarold Motto, PA to facilitate increased independence with communication.  PAIN:  Are you having pain? No  PATIENT GOALS: Increase ability to talk  OBJECTIVE:   DIAGNOSTIC FINDINGS:  MRI 11/13/2020: IMPRESSION: No evidence of acute intracranial abnormality, including acute infarction.   Redemonstrated large chronic left MCA territory infarct.   Redemonstrated small chronic cortically based infarct within the posterior right temporal lobe.   Mild bilateral ethmoid sinus mucosal thickening.  PATIENT EDUCATION: Education details: Practice the "Say it, Write it, Read it" HEP provided with pictures Monnig educated: Patient and sister, Robin [name] Education method: Explanation and Handouts Education comprehension: verbalized understanding and needs further  education   GOALS: Goals reviewed with patient? Yes  SHORT TERM GOALS: Target date: 05/28/2023  Pt will increase verbal naming of common objects/pictures to 75% acc when provided with mod multimodality cues Baseline: 80% when allowed to write response or provided mod/max cues Goal status: MET for 100% acc for mod/max cues (phonemic cue provided); change to 100% acc with mod cues.  2.  Pt will describe objects and pictures by providing at least three salient features (single words ok) as judged by clinician with 80% acc when provided mod cues. Baseline: 70% mod/max cues Goal status: MET for 100% with mod/max cues provided; change to 100% acc with mod cues.  3.  Pt will complete single word sentence completion tasks [facility]) with 90% acc when provided with min/mod multimodality cues.  Baseline: 75% mod cues of initial phoneme modeled Goal status: MET for 100% acc with mi/mod cues provided; change to 100% with min cues.  4.  Pt will complete basic level reading comprehension tasks (picture cues for sentence level) with 80% acc  with provided mi/mod cues. Baseline: 100% word to picture matching f=5, 0/5 sentences Goal status: MET for single words, continue for sentences  5.  Pt will verbally describe action photos with use of "who" "what doing" template with 80% acc and min/mod cues.  Baseline: 70% mod cues. Goal status: MET for 100% acc with mod/max cues; change to 100% with mod cues.  6.  Pt will complete personally relevant responsive naming tasks with 90% acc with use of written cues as needed. Baseline: 75% acc Goal status: MET  7. Pt will use a communication device to cue themselves to speak (reading or imitating words) during 1:1 conversations with SLP with min/mod cues over 3 consecutive sessions.  Baseline: Max assist  Goal status: ONGOING  8. Pt will respond to personal preference questions by answering with alternative communication device with 90% acc and min  assist.  Baseline: Max assist  Goal status: ONGOING  LONG TERM GOALS: Target date: 06/25/2023  Pt will communicate moderately complex wants/needs to [facility] with min assist from communication partner and use of multimodality communication strategies. Baseline: Pt requires mod assist from communication partner  Goal status: ONGOING   ASSESSMENT:  PREVIOUS TREATMENT:     03/26/23: Pt accompanied to therapy this date by her caregiver, Robin [name] and her sister. They brought in a notebook and completed a good portion of the personalized communication template. SLP reviewed the information with Pt and Pt asked to read names of family members. Pt able to read 75% of the names and benefited from cues to look away/provide diversion and return to task when she could not read it right away. Family provided education on ways to help her with this at home. She was able to write the letters with her finger on the table 90% of the time and even orally spell when unable to say the word. Pt completed automatic speech tasks with 80% acc, but needed cues to decrease rate to increase accuracy of responses. Goals and evaluation were reviewed. Pt smiling and engaged throughout the session. She occasionally stated, "I can't do that", but with encouragement she tried to complete the request. Her sister commented that she seems to be able to communicate with some family members better than others and SLP suggested that she ascertain the content of conversation (ie. Harder to talk about personal needs with sister than lighter conversation with an aunt). Target above goals next session.   04/03/2023: Pt accompanied by caregiver, Robin [name] and brought in her notebook. Pt completed confrontation naming task with 50% acc without cues and increased to 90% acc with cues (Pt able to use letter board to spell the word 90% of the time). She completed single word sentence completion (SWSC) task by providing accurate verbal response when looking  at pictures with 80% acc with mi/mod cues. She was then asked to repeat the word x3 and SLP interjected with confounds at times and Pt asked to return to word. This was very challenging for Pt with performance improved throughout trials. She was able to provide a gesture for each work with 100% acc. She answered responsive naming questions with use of personal communication template as needed with 95% acc. SLP provided strategies for accurate number production by having Pt count on her fingers and show the number on her hands and then verbalize the number, which appeared effective. Introduce the "Say it" set list of words next session.   04/09/2023: Pt accompanied by her caregiver, Robin [name]. Pt indicated that she went to the  ED yesterday due to chest pain and nose bleed. She currently feels much better. She completed confrontation naming of high frequency words with 50% acc, single word sentence completion task with 100% acc with min cues, responsive naming with 90% and mod cues, and elicited appropriate gesture for words with 100% acc with min assist. She was cued to use her personalized communication template to help find answers to responsive naming tasks. During confrontation naming tasks, she benefited from being able to write the word, but had difficulty saying the word without phonemic and sentence cues. New words were added to her communication template (purse, keys, phone, water, etc). Continue to target goals above and focus on responsive naming tasks for personal information.     04/14/2023: Pt accompanied by her caregiver, Robin [name]. She verbalized that she went to [place] over the weekend to see friends/family. She completed responsive naming tasks in conversation regarding personally relevant information with 90% acc with ~75% acc for verbal responses and 100% when allowed to write or use written cues. She completed confrontation naming task when looking at icons on a tablet with 70% acc with mod cues,  but was able to repeat the name of the item after model provided, with 90% acc. She was introduced to the [place] tablet today and she expressed interest in using it to help her communicate. She indicates that she thinks she has something similar at home and will bring in next session. She was able to quickly point to 7 fruits that she likes to eat and was able to repeat 6/7 after model provided. Update goals next session.       04/17/23:  Pt was accompanied by a new caregiver today because Robin [name] had an appointment. Pt was able to communicate this to SLP with use of written personalized template to identify the name "Robin [name]". Robin [name] answered personally relevant responsive naming questions (regarding names of friends, family, and medical professionals) with 100% acc with use of communication support as needed and accurate verbal response 90% of the time with min cues. Pt verbalized a subject, verb/action, and object when looking at action photos with 92% acc with mi/mod cues and was then able to repeat with written model provided with 100% acc with min assist. She then completed single word sentence completion task with 100% acc with min cues. Robin [name] occasionally shuts down when a task feels out of her reach or too challenging and needs encouragement to try the task. She was trying to verbalize "smelling" and produced "smoking" instead, but was able to break the perseveration and produce "smelling" on ~70% of cued trials. She was given divergent naming task with word bank provided for homework and completed trial x 2 before ending the session. Next session, continue to expansive naming tasks to generate sentences when looking at photos.   04/23/2023: Pt accompanied to therapy by Robin [name]. She completed responsive naming task by answering personally relevant questions with use of written cues provided with 100% acc. She is unable to verbalize responses independently without use of written cues or initial phoneme  cue from SLP. She named common objects with 100% acc when allowed to write her response or when given initial phoneme cue and she completed single word sentence completion tasks with 100% acc with provided mi/mod cues. She independently uses total communication strategies by gesturing, writing, or looking for written response when she is unable to verbalize her response. Unfortunately, she needs environmental supports via communication partner, written templates, gestures, and ability to hand write  responses to communicate her thoughts. Pt would make an excellent candidate for a dynamic communication system. We have tried some apps on her phone, however she would benefit from a dedicated speech generating device. Goals have been updated and will request a trial of a communication device. Pt is in agreement with plan of care. Requesting 2x/week for 4 more weeks.   04/28/23: Pt accompanied by a different caregiver today and she was able to locate her name by finding it in her contacts on her phone. She completed responsive naming tasks with 100% acc with use of communication template, described action pictures using subject and verb with 100% acc with mod cues, completed SWSC tasks with 100% acc with min cues, confrontation naming tasks for high frequency words with 100% acc with mod/max cues. She was able to repeat 5 word sentences with 100% acc when provided mi/mod cues from SLP. SLP submitted request for AAC device trials and Pt expressed excitement over having a more efficient way to help her communicate. Continue to target goals next session.   04/30/2023: SLP submitted request to check insurance benefits for alternative communication device, awaiting response. Pt completed HEP. She used verbal responses, letter board, communication template, and self written responses to answer personally relevant responsive naming tasks with 94% acc with mi/mod use of non-verbal responses. She completed confrontation naming  of high frequency pictures with 100% acc with mod cues. Pt expressed that mentally fatiguing for her to verbalize responses at times. SLP provided reassurance and positive reinforcement toward Pt gains this session. She continues to be motivated and uses alternative communication strategies (even able to spell words aloud when unable to verbalize the word) with indirect cues. Continue targeting goals and trials of AAC devices.   05/21/2023: Pt unable to attend therapy in the last few weeks due to scheduling conflicts with her ability to obtain transportation. She was accompanied to today's visit by her sister and her new caregiver. SLP introduced the [place] communication device today. Pt able to turn the device on and select icons with cues from SLP (orientation factors and the specific folders). SLP opened the page to breakfast items and Pt selected 10 items she enjoys eating and was encouraged to use the device to cue herself to speak if unable to verbalize the word. She assisted SLP in adding personally relevant information to her communication template that she would like to have on the device. Her schedule has been updated and she will be able to attend more consistently and expressed feeling positive about using a speech generating device to assist with communication. Device will need further personalization before we begin a trial.   CURRENT TREATMENT: Pt accompanied to therapy by her new caregiver. She communicated to me that her son was shot and is in the hospital. SLP showed Pt how to add icons and folders to the [employer] device. Pt requested that we add "Diet [facility]" and was able to then return demonstrate navigation of the device to select the icon and use it to prompt verbal production from her. She also identified two Gospel singers she likes and we added those icons. She was encouraged to verbalize her responses first when answering Wh-questions and then use the device to prompt  speech. She verbalized that her son was "53" years old, however when she was prompted to write the age on the dry erase board feature, she wrote his correct age. Diara can often write the word she wants to say when she is unable  to verbalize it. She was encouraged to use the camera feature at home to take photos to add to the [employer]. Continue with device navigation and personalization next session.                                                                                                                     DATE: 05/26/23  CLINICAL IMPRESSION: (from initial evaluation on 03/19/2023) Patient is a 57 y.o. female who was seen today for speech/language evaluation s/p CVA in May of 2021 with only limited follow up SLP therapy via home health in 2021 when she was staying with her daughter. [name] sister accompanied her to the evaluation and would like for her sister to be able to communicate her wants and needs verbally. 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 Pt has difficulty verbally labeling high frequency objects (but was able to write some responses), repeating longer words/phrases, and reading phrases. She was able to match 5/5 high frequency objects to single words, but was only able to orally read/label 1/5 words. Pt benefited from gesturing to communicate her intent and this occasionally helped elicit word production and she also benefited from SLP phonemic cues and single word sentence completion cues. Recommend SLP therapy to address communication deficits and increase independence with communication 2x/week for 4-8 weeks.  OBJECTIVE IMPAIRMENTS: include  executive functioning, expressive language, receptive language, aphasia, and apraxia. These impairments are limiting patient from managing medications, managing appointments, managing finances, and effectively communicating at home and in community. Factors affecting potential to achieve goals and functional outcome are ability to learn/carryover information, severity of impairments, and time post onset of aphasia (2021) . Patient will benefit from skilled SLP services to address above impairments and improve overall function.  REHAB POTENTIAL: Good  PLAN:  SLP FREQUENCY: 2x/week  SLP DURATION: 8 weeks  PLANNED INTERVENTIONS: Cueing hierachy, Internal/external aids, Functional tasks, Multimodal communication approach, SLP instruction and feedback, Compensatory strategies, Patient/family education, and Re-evaluation    Thank you,  Havery Moros, CCC-SLP [phone]  Robin [name], CCC-SLP 05/26/2023, 10:00 AM

## 2023-05-28 ENCOUNTER — Ambulatory Visit (HOSPITAL_COMMUNITY): Payer: 59 | Admitting: Speech Pathology

## 2023-05-28 ENCOUNTER — Encounter (HOSPITAL_COMMUNITY): Payer: Self-pay | Admitting: Speech Pathology

## 2023-05-28 DIAGNOSIS — R41841 Cognitive communication deficit: Secondary | ICD-10-CM

## 2023-05-28 DIAGNOSIS — R4701 Aphasia: Secondary | ICD-10-CM

## 2023-05-28 DIAGNOSIS — I69890 Apraxia following other cerebrovascular disease: Secondary | ICD-10-CM

## 2023-05-28 NOTE — Therapy (Signed)
OUTPATIENT SPEECH LANGUAGE PATHOLOGY TREATMENT   Patient Name: Robin [name] MRN: [id number] DOB:Sep 16, 1965, 57 y.o., female Today's Date: 05/28/2023  PCP: Jarold Motto, PA REFERRING PROVIDER: Jarold Motto, PA  END OF SESSION:  End of Session - 05/28/23 0918     Visit Number 12    Number of Visits 24    Date for SLP Re-Evaluation 07/03/23    Authorization Type [employer] Medicare and Medicaid secondary; Dual complete   eff 08/05/22 ded 240 met oop 8850 met 2034.20 limit-no auth-no co ins-20%   SLP Start Time 0845    SLP Stop Time  0930    SLP Time Calculation (min) 45 min    Activity Tolerance Patient tolerated treatment well             Past Medical History:  Diagnosis Date   A-fib (HCC)    Abdominal aortic aneurysm (AAA) (HCC)    Allergy    Anemia    Arthritis    Atrial fibrillation (HCC)    CHF (congestive heart failure) (HCC)    a. EF at 30-35% by echo in 08/2018 b. EF at 35% by repeat echo in 05/2019 c. EF improved to 65-70% in 2021 d. EF at 40-45% in 08/2021.   Chronic abdominal pain    Chronic atrial fibrillation (HCC)    Cocaine abuse (HCC)    COPD (chronic obstructive pulmonary disease) (HCC)    Diabetes mellitus without complication (HCC)    Essential hypertension, benign    Expressive aphasia    Expressive aphasia    post CVA   Fatty liver    GERD (gastroesophageal reflux disease)    Gout 2016   Normal coronary arteries    3/10 - following abnormal Myoview   Ovarian cyst    Stroke (HCC) 12/26/2019   Right sided weakness, and expressive aphasia   Stroke [facility])    Thoracic ascending aortic aneurysm (HCC)    4 cm 10/31/19 CTA   Type 2 diabetes mellitus (HCC)    type II   Past Surgical History:  Procedure Laterality Date   ABDOMINAL HYSTERECTOMY  09/10/2011   Procedure: HYSTERECTOMY ABDOMINAL;  Surgeon: Tilda Burrow, MD;  Location: [facility];  Service: Gynecology;  Laterality: N/A;  Abdominal hysterectomy   CESAREAN SECTION  [phone], and 1994    CHOLECYSTECTOMY  1995   IR 3D INDEPENDENT WKST  03/16/2020   IR ANGIO INTRA EXTRACRAN SEL INTERNAL CAROTID BILAT MOD SED  03/16/2020   IR ANGIO VERTEBRAL SEL SUBCLAVIAN INNOMINATE UNI L MOD SED  03/16/2020   IR ANGIO VERTEBRAL SEL VERTEBRAL UNI R MOD SED  03/16/2020   IR CT HEAD LTD  12/27/2019   IR PERCUTANEOUS ART THROMBECTOMY/INFUSION INTRACRANIAL INC DIAG ANGIO  12/27/2019   IR US GUIDE VASC ACCESS RIGHT  12/27/2019   IR US GUIDE VASC ACCESS RIGHT  03/16/2020   RADIOLOGY WITH ANESTHESIA N/A 12/27/2019   Procedure: IR WITH ANESTHESIA;  Surgeon: Julieanne Cotton, MD;  Location: [facility];  Service: Radiology;  Laterality: N/A;   SCAR REVISION  09/10/2011   Procedure: SCAR REVISION;  Surgeon: Tilda Burrow, MD;  Location: [facility];  Service: Gynecology;  Laterality: N/A;  Wide Excision of old Cicatrix   TUBAL LIGATION  1994   Patient Active Problem List   Diagnosis Date Noted   Acute on chronic systolic CHF (congestive heart failure) (HCC) 11/27/2021   Stage 3b chronic kidney disease (CKD) (HCC) 11/27/2021   Microcytic anemia 11/27/2021   Lactic acidosis 11/27/2021  Diabetes mellitus (HCC) 03/09/2021   Type 2 diabetes mellitus with stage 3a chronic kidney disease, with long-term current use of insulin (HCC) 03/09/2021   Acute metabolic encephalopathy 12/31/2020   Hyperglycemia 12/31/2020   Prolonged QT interval 05/12/2020   History of CVA with residual deficit 05/12/2020   Atrial fibrillation, chronic (HCC) 05/12/2020   Right hemiparesis (HCC)    Essential hypertension    Chronic diastolic congestive heart failure (HCC)    Dysphagia due to recent stroke 01/05/2020   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 01/05/2020   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/p clot retrieval 12/27/2019   Thoracic aortic aneurysm without rupture (HCC)    Atrial fibrillation with RVR (HCC) 05/10/2019   Type 2 diabetes mellitus with diabetic autonomic neuropathy, with long-term current use of insulin  (HCC) 09/01/2018   Cocaine abuse (HCC) 08/25/2018   Cigarette nicotine dependence, uncomplicated 06/07/2015   GERD (gastroesophageal reflux disease) 06/07/2011   COPD (chronic obstructive pulmonary disease) (HCC) 06/07/2011   Arthritis 06/07/2011    ONSET DATE: May 2021   REFERRING DIAG: R47.01 (ICD-10-CM) - Expressive aphasia  THERAPY DIAG:  Aphasia  Apraxia following other cerebrovascular disease  Cognitive communication deficit  Rationale for Evaluation and Treatment: Rehabilitation  SUBJECTIVE:   SUBJECTIVE STATEMENT: "My Mom will take me." (To vote)  Pt accompanied by: self and family member  PERTINENT HISTORY: Robin [name] is a pleasant 57 y.o. female with medical history significant for atrial fibrillation on Eliquis, chronic systolic CHF, CKD 3B, insulin-dependent diabetes mellitus, polysubstance abuse, COPD, GERD, HTN, and history of CVA with residual right-sided weakness and aphasia (L MCA CVA and L ICA occlusion s/p thrombectomy on 12/27/2019 followed by CIR stay where she received ST/OT/PT until d/c home on 02/02/20 and then received some [facility] SLP services). She is referred for outpatient SLP therapy by Jarold Motto, PA to facilitate increased independence with communication.  PAIN:  Are you having pain? No  PATIENT GOALS: Increase ability to talk  OBJECTIVE:   DIAGNOSTIC FINDINGS:  MRI 11/13/2020: IMPRESSION: No evidence of acute intracranial abnormality, including acute infarction.   Redemonstrated large chronic left MCA territory infarct.   Redemonstrated small chronic cortically based infarct within the posterior right temporal lobe.   Mild bilateral ethmoid sinus mucosal thickening.  PATIENT EDUCATION: Education details: Practice the "Say it, Write it, Read it" HEP provided with pictures Foiles educated: Patient and sister, Robin [name] Education method: Explanation and Handouts Education comprehension: verbalized understanding and needs further  education   GOALS: Goals reviewed with patient? Yes  SHORT TERM GOALS: Target date: 07/10/2023  Pt will increase verbal naming of common objects/pictures to 75% acc when provided with mod multimodality cues Baseline: 80% when allowed to write response or provided mod/max cues Goal status: MET for 100% acc for mod/max cues (phonemic cue provided); change to 100% acc with mod cues; met   2.  Pt will describe objects and pictures by providing at least three salient features (single words ok) as judged by clinician with 80% acc when provided mod cues. Baseline: 70% mod/max cues Goal status: MET for 100% with mod/max cues provided; change to 100% acc with mod cues; met   3.  Pt will complete single word sentence completion tasks [facility]) with 90% acc when provided with min/mod multimodality cues.  Baseline: 75% mod cues of initial phoneme modeled Goal status: MET for 100% acc with mi/mod cues provided; change to 100% with min cues; Partially met, d/c  4.  Pt will complete basic level  reading comprehension tasks (picture cues for sentence level) with 80% acc with provided mi/mod cues. Baseline: 100% word to picture matching f=5, 0/5 sentences Goal status: MET for single words, continue for sentences; Partially Met, use on device  5.  Pt will verbally describe action photos with use of "who" "what doing" template with 80% acc and min/mod cues.  Baseline: 70% mod cues. Goal status: MET for 100% acc with mod/max cues; change to 100% with mod cues; Met  6.  Pt will complete personally relevant responsive naming tasks with 90% acc with use of written cues as needed. Baseline: 75% acc Goal status: MET  7. Pt will use a communication device to cue themselves to speak (reading or imitating words) during 1:1 conversations with SLP with min/mod cues over 3 consecutive sessions.  Baseline: Max assist  Goal status: ONGOING  8. Pt will respond to personal preference questions by answering with  alternative communication device with 90% acc and min assist.  Baseline: Max assist  Goal status: ONGOING  9. Pt will use alternative means of communication (low tech and high tech) to maximize independence with communication and find a system that is most efficient for her.  Baseline: Trial of Lingraphica, Spoken App, letter board, etc  Goal status: NEW  10. Pt will use alternative means of communication to locate list of medications, health symptoms, and personal/bio information with indirect cues.  Baseline: mod assist  Goal status: NEW  LONG TERM GOALS: Target date: 07/25/2023  Pt will communicate moderately complex wants/needs to [facility] with min assist from communication partner and use of multimodality communication strategies. Baseline: Pt requires mod assist from communication partner  Goal status: ONGOING   ASSESSMENT:  PREVIOUS TREATMENT:     03/26/23: Pt accompanied to therapy this date by her caregiver, Robin [name] and her sister. They brought in a notebook and completed a good portion of the personalized communication template. SLP reviewed the information with Pt and Pt asked to read names of family members. Pt able to read 75% of the names and benefited from cues to look away/provide diversion and return to task when she could not read it right away. Family provided education on ways to help her with this at home. She was able to write the letters with her finger on the table 90% of the time and even orally spell when unable to say the word. Pt completed automatic speech tasks with 80% acc, but needed cues to decrease rate to increase accuracy of responses. Goals and evaluation were reviewed. Pt smiling and engaged throughout the session. She occasionally stated, "I can't do that", but with encouragement she tried to complete the request. Her sister commented that she seems to be able to communicate with some family members better than others and SLP suggested that she ascertain the  content of conversation (ie. Harder to talk about personal needs with sister than lighter conversation with an aunt). Target above goals next session.   04/03/2023: Pt accompanied by caregiver, Robin [name] and brought in her notebook. Pt completed confrontation naming task with 50% acc without cues and increased to 90% acc with cues (Pt able to use letter board to spell the word 90% of the time). She completed single word sentence completion (SWSC) task by providing accurate verbal response when looking at pictures with 80% acc with mi/mod cues. She was then asked to repeat the word x3 and SLP interjected with confounds at times and Pt asked to return to word. This was very challenging for  Pt with performance improved throughout trials. She was able to provide a gesture for each work with 100% acc. She answered responsive naming questions with use of personal communication template as needed with 95% acc. SLP provided strategies for accurate number production by having Pt count on her fingers and show the number on her hands and then verbalize the number, which appeared effective. Introduce the "Say it" set list of words next session.   04/09/2023: Pt accompanied by her caregiver, Robin [name]. Pt indicated that she went to the ED yesterday due to chest pain and nose bleed. She currently feels much better. She completed confrontation naming of high frequency words with 50% acc, single word sentence completion task with 100% acc with min cues, responsive naming with 90% and mod cues, and elicited appropriate gesture for words with 100% acc with min assist. She was cued to use her personalized communication template to help find answers to responsive naming tasks. During confrontation naming tasks, she benefited from being able to write the word, but had difficulty saying the word without phonemic and sentence cues. New words were added to her communication template (purse, keys, phone, water, etc). Continue to target goals  above and focus on responsive naming tasks for personal information.     04/14/2023: Pt accompanied by her caregiver, Robin [name]. She verbalized that she went to [address] over the weekend to see friends/family. She completed responsive naming tasks in conversation regarding personally relevant information with 90% acc with ~75% acc for verbal responses and 100% when allowed to write or use written cues. She completed confrontation naming task when looking at icons on a tablet with 70% acc with mod cues, but was able to repeat the name of the item after model provided, with 90% acc. She was introduced to the [place] tablet today and she expressed interest in using it to help her communicate. She indicates that she thinks she has something similar at home and will bring in next session. She was able to quickly point to 7 fruits that she likes to eat and was able to repeat 6/7 after model provided. Update goals next session.       04/17/23:  Pt was accompanied by a new caregiver today because Robin [name] had an appointment. Pt was able to communicate this to SLP with use of written personalized template to identify the name "Robin [name]". Robin [name] answered personally relevant responsive naming questions (regarding names of friends, family, and medical professionals) with 100% acc with use of communication support as needed and accurate verbal response 90% of the time with min cues. Pt verbalized a subject, verb/action, and object when looking at action photos with 92% acc with mi/mod cues and was then able to repeat with written model provided with 100% acc with min assist. She then completed single word sentence completion task with 100% acc with min cues. Robin [name] occasionally shuts down when a task feels out of her reach or too challenging and needs encouragement to try the task. She was trying to verbalize "smelling" and produced "smoking" instead, but was able to break the perseveration and produce "smelling" on ~70% of  cued trials. She was given divergent naming task with word bank provided for homework and completed trial x 2 before ending the session. Next session, continue to expansive naming tasks to generate sentences when looking at photos.   04/23/2023: Pt accompanied to therapy by Robin [name]. She completed responsive naming task by answering personally relevant questions with use of written cues provided with 100% acc.  She is unable to verbalize responses independently without use of written cues or initial phoneme cue from SLP. She named common objects with 100% acc when allowed to write her response or when given initial phoneme cue and she completed single word sentence completion tasks with 100% acc with provided mi/mod cues. She independently uses total communication strategies by gesturing, writing, or looking for written response when she is unable to verbalize her response. Unfortunately, she needs environmental supports via communication partner, written templates, gestures, and ability to hand write responses to communicate her thoughts. Pt would make an excellent candidate for a dynamic communication system. We have tried some apps on her phone, however she would benefit from a dedicated speech generating device. Goals have been updated and will request a trial of a communication device. Pt is in agreement with plan of care. Requesting 2x/week for 4 more weeks.   04/28/23: Pt accompanied by a different caregiver today and she was able to locate her name by finding it in her contacts on her phone. She completed responsive naming tasks with 100% acc with use of communication template, described action pictures using subject and verb with 100% acc with mod cues, completed SWSC tasks with 100% acc with min cues, confrontation naming tasks for high frequency words with 100% acc with mod/max cues. She was able to repeat 5 word sentences with 100% acc when provided mi/mod cues from SLP. SLP submitted request for AAC  device trials and Pt expressed excitement over having a more efficient way to help her communicate. Continue to target goals next session.   04/30/2023: SLP submitted request to check insurance benefits for alternative communication device, awaiting response. Pt completed HEP. She used verbal responses, letter board, communication template, and self written responses to answer personally relevant responsive naming tasks with 94% acc with mi/mod use of non-verbal responses. She completed confrontation naming of high frequency pictures with 100% acc with mod cues. Pt expressed that mentally fatiguing for her to verbalize responses at times. SLP provided reassurance and positive reinforcement toward Pt gains this session. She continues to be motivated and uses alternative communication strategies (even able to spell words aloud when unable to verbalize the word) with indirect cues. Continue targeting goals and trials of AAC devices.   05/21/2023: Pt unable to attend therapy in the last few weeks due to scheduling conflicts with her ability to obtain transportation. She was accompanied to today's visit by her sister and her new caregiver. SLP introduced the [place] communication device today. Pt able to turn the device on and select icons with cues from SLP (orientation factors and the specific folders). SLP opened the page to breakfast items and Pt selected 10 items she enjoys eating and was encouraged to use the device to cue herself to speak if unable to verbalize the word. She assisted SLP in adding personally relevant information to her communication template that she would like to have on the device. Her schedule has been updated and she will be able to attend more consistently and expressed feeling positive about using a speech generating device to assist with communication. Device will need further personalization before we begin a trial.   05/26/2023: Pt accompanied to therapy by her new caregiver. She  communicated to me that her son was shot and is in the hospital. SLP showed Pt how to add icons and folders to the [place] device. Pt requested that we add "Diet [facility]" and was able to then return demonstrate navigation of the  device to select the icon and use it to prompt verbal production from her. She also identified two Gospel singers she likes and we added those icons. She was encouraged to verbalize her responses first when answering Wh-questions and then use the device to prompt speech. She verbalized that her son was "30" years old, however when she was prompted to write the age on the dry erase board feature, she wrote his correct age. Robin [name] can often write the word she wants to say when she is unable to verbalize it. She was encouraged to use the camera feature at home to take photos to add to the [employer]. Continue with device navigation and personalization next session.   CURRENT TREATMENT:  Pt used the Lingraphica device at home by completing verbal expression activities in therapy section.  SLP facilitated session by adding additional icons and folders for Pt to express herself in session and with her caregivers at home. She was able to select food choices and restaurants with min assist once opened to the page. SLP cued the Pt to use the device to cue herself to speak (model from device and then attempt to repeat). She expressed that she would like to be able to vote, but didn't think that she was able to. SLP encouraged Pt to have someone from her family to take her to vote and she can request assist as needed. Pt smiled throughout the session and used multi-modality systems to communicate with SLP (gestures, verbal response, AAC device, written responses, and occasionally letter board). Pt continues to be motivated and shows excellent ability to use an AAC. Continue with trials and goals have been updated, cert request.                                                                                                             DATE: 05/28/23  CLINICAL IMPRESSION: (from initial evaluation on 03/19/2023) Patient is a 57 y.o. female who was seen today for speech/language evaluation s/p CVA in May of 2021 with only limited follow up SLP therapy via home health in 2021 when she was staying with her daughter. [name] sister accompanied her to the evaluation and would like for her sister to be able to communicate her wants and needs verbally. 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 Pt has difficulty verbally labeling high frequency objects (but was able to write some responses), repeating longer words/phrases, and reading phrases. She was able to match 5/5 high frequency objects to single words, but was only able to orally read/label 1/5 words. Pt benefited from gesturing to communicate her intent and this occasionally helped elicit word production and she also benefited from SLP phonemic cues and single word sentence completion cues. Recommend SLP therapy to address communication deficits and increase independence  with communication 2x/week for 4-8 weeks.  OBJECTIVE IMPAIRMENTS: include executive functioning, expressive language, receptive language, aphasia, and apraxia. These impairments are limiting patient from managing medications, managing appointments, managing finances, and effectively communicating at home and in community. Factors affecting potential to achieve goals and functional outcome are ability to learn/carryover information, severity of impairments, and time post onset of aphasia (2021) . Patient will benefit from skilled SLP services to  address above impairments and improve overall function.  REHAB POTENTIAL: Good  PLAN:  SLP FREQUENCY: 2x/week  SLP DURATION: 8 weeks  PLANNED INTERVENTIONS: Cueing hierachy, Internal/external aids, Functional tasks, Multimodal communication approach, SLP instruction and feedback, Compensatory strategies, Patient/family education, and Re-evaluation    Thank you,  Havery Moros, CCC-SLP [phone]  Thoms Barthelemy, CCC-SLP 05/28/2023, 9:40 AM

## 2023-05-29 ENCOUNTER — Other Ambulatory Visit: Payer: Self-pay | Admitting: Physician Assistant

## 2023-06-02 ENCOUNTER — Encounter (HOSPITAL_COMMUNITY): Payer: Self-pay | Admitting: Speech Pathology

## 2023-06-02 ENCOUNTER — Ambulatory Visit (HOSPITAL_COMMUNITY): Payer: 59 | Admitting: Speech Pathology

## 2023-06-02 DIAGNOSIS — I69890 Apraxia following other cerebrovascular disease: Secondary | ICD-10-CM

## 2023-06-02 DIAGNOSIS — R4701 Aphasia: Secondary | ICD-10-CM | POA: Diagnosis not present

## 2023-06-02 NOTE — Therapy (Signed)
OUTPATIENT SPEECH LANGUAGE PATHOLOGY TREATMENT   Patient Name: Robin [name] MRN: [id number] DOB:July 10, 1966, 57 y.o., female Today's Date: 06/02/2023  PCP: Jarold Motto, PA REFERRING PROVIDER: Jarold Motto, PA  END OF SESSION:  End of Session - 06/02/23 1652     Visit Number 13    Number of Visits 24    Date for SLP Re-Evaluation 07/03/23    Authorization Type [employer] Medicare and Medicaid secondary; Dual complete   eff 08/05/22 ded 240 met oop 8850 met 2034.20 limit-no auth-no co ins-20%   SLP Start Time 0845    SLP Stop Time  0930    SLP Time Calculation (min) 45 min    Activity Tolerance Patient tolerated treatment well             Past Medical History:  Diagnosis Date   A-fib (HCC)    Abdominal aortic aneurysm (AAA) (HCC)    Allergy    Anemia    Arthritis    Atrial fibrillation (HCC)    CHF (congestive heart failure) (HCC)    a. EF at 30-35% by echo in 08/2018 b. EF at 35% by repeat echo in 05/2019 c. EF improved to 65-70% in 2021 d. EF at 40-45% in 08/2021.   Chronic abdominal pain    Chronic atrial fibrillation (HCC)    Cocaine abuse (HCC)    COPD (chronic obstructive pulmonary disease) (HCC)    Diabetes mellitus without complication (HCC)    Essential hypertension, benign    Expressive aphasia    Expressive aphasia    post CVA   Fatty liver    GERD (gastroesophageal reflux disease)    Gout 2016   Normal coronary arteries    3/10 - following abnormal Myoview   Ovarian cyst    Stroke (HCC) 12/26/2019   Right sided weakness, and expressive aphasia   Stroke [facility]    Thoracic ascending aortic aneurysm (HCC)    4 cm 10/31/19 CTA   Type 2 diabetes mellitus (HCC)    type II   Past Surgical History:  Procedure Laterality Date   ABDOMINAL HYSTERECTOMY  09/10/2011   Procedure: HYSTERECTOMY ABDOMINAL;  Surgeon: Tilda Burrow, MD;  Location: [facility];  Service: Gynecology;  Laterality: N/A;  Abdominal hysterectomy   CESAREAN SECTION  [phone], and 1994    CHOLECYSTECTOMY  1995   IR 3D INDEPENDENT WKST  03/16/2020   IR ANGIO INTRA EXTRACRAN SEL INTERNAL CAROTID BILAT MOD SED  03/16/2020   IR ANGIO VERTEBRAL SEL SUBCLAVIAN INNOMINATE UNI L MOD SED  03/16/2020   IR ANGIO VERTEBRAL SEL VERTEBRAL UNI R MOD SED  03/16/2020   IR CT HEAD LTD  12/27/2019   IR PERCUTANEOUS ART THROMBECTOMY/INFUSION INTRACRANIAL INC DIAG ANGIO  12/27/2019   IR US GUIDE VASC ACCESS RIGHT  12/27/2019   IR US GUIDE VASC ACCESS RIGHT  03/16/2020   RADIOLOGY WITH ANESTHESIA N/A 12/27/2019   Procedure: IR WITH ANESTHESIA;  Surgeon: Julieanne Cotton, MD;  Location: [facility];  Service: Radiology;  Laterality: N/A;   SCAR REVISION  09/10/2011   Procedure: SCAR REVISION;  Surgeon: Tilda Burrow, MD;  Location: [facility];  Service: Gynecology;  Laterality: N/A;  Wide Excision of old Cicatrix   TUBAL LIGATION  1994   Patient Active Problem List   Diagnosis Date Noted   Acute on chronic systolic CHF (congestive heart failure) (HCC) 11/27/2021   Stage 3b chronic kidney disease (CKD) (HCC) 11/27/2021   Microcytic anemia 11/27/2021   Lactic acidosis 11/27/2021  Diabetes mellitus (HCC) 03/09/2021   Type 2 diabetes mellitus with stage 3a chronic kidney disease, with long-term current use of insulin (HCC) 03/09/2021   Acute metabolic encephalopathy 12/31/2020   Hyperglycemia 12/31/2020   Prolonged QT interval 05/12/2020   History of CVA with residual deficit 05/12/2020   Atrial fibrillation, chronic (HCC) 05/12/2020   Right hemiparesis (HCC)    Essential hypertension    Chronic diastolic congestive heart failure (HCC)    Dysphagia due to recent stroke 01/05/2020   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 01/05/2020   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/p clot retrieval 12/27/2019   Thoracic aortic aneurysm without rupture (HCC)    Atrial fibrillation with RVR (HCC) 05/10/2019   Type 2 diabetes mellitus with diabetic autonomic neuropathy, with long-term current use of insulin  (HCC) 09/01/2018   Cocaine abuse (HCC) 08/25/2018   Cigarette nicotine dependence, uncomplicated 06/07/2015   GERD (gastroesophageal reflux disease) 06/07/2011   COPD (chronic obstructive pulmonary disease) (HCC) 06/07/2011   Arthritis 06/07/2011    ONSET DATE: May 2021   REFERRING DIAG: R47.01 (ICD-10-CM) - Expressive aphasia  THERAPY DIAG:  Aphasia  Apraxia following other cerebrovascular disease  Rationale for Evaluation and Treatment: Rehabilitation  SUBJECTIVE:   SUBJECTIVE STATEMENT: "They didn't show up." (Pt took RCAT solo because caregiver arrived late)  Pt accompanied by: self and family member  PERTINENT HISTORY: Robin [name] is a pleasant 57 y.o. female with medical history significant for atrial fibrillation on Eliquis, chronic systolic CHF, CKD 3B, insulin-dependent diabetes mellitus, polysubstance abuse, COPD, GERD, HTN, and history of CVA with residual right-sided weakness and aphasia (L MCA CVA and L ICA occlusion s/p thrombectomy on 12/27/2019 followed by CIR stay where she received ST/OT/PT until d/c home on 02/02/20 and then received some [facility] SLP services). She is referred for outpatient SLP therapy by Jarold Motto, PA to facilitate increased independence with communication.  PAIN:  Are you having pain? No  PATIENT GOALS: Increase ability to talk  OBJECTIVE:   DIAGNOSTIC FINDINGS:  MRI 11/13/2020: IMPRESSION: No evidence of acute intracranial abnormality, including acute infarction.   Redemonstrated large chronic left MCA territory infarct.   Redemonstrated small chronic cortically based infarct within the posterior right temporal lobe.   Mild bilateral ethmoid sinus mucosal thickening.  PATIENT EDUCATION: Education details: Practice the "Say it, Write it, Read it" HEP provided with pictures Robin [name] educated: Patient and sister, [name] Education method: Explanation and Handouts Education comprehension: verbalized understanding and needs further  education   GOALS: Goals reviewed with patient? Yes  SHORT TERM GOALS: Target date: 07/10/2023  Pt will increase verbal naming of common objects/pictures to 75% acc when provided with mod multimodality cues Baseline: 80% when allowed to write response or provided mod/max cues Goal status: MET for 100% acc for mod/max cues (phonemic cue provided); change to 100% acc with mod cues; met   2.  Pt will describe objects and pictures by providing at least three salient features (single words ok) as judged by clinician with 80% acc when provided mod cues. Baseline: 70% mod/max cues Goal status: MET for 100% with mod/max cues provided; change to 100% acc with mod cues; met   3.  Pt will complete single word sentence completion tasks Robin [name]) with 90% acc when provided with min/mod multimodality cues.  Baseline: 75% mod cues of initial phoneme modeled Goal status: MET for 100% acc with mi/mod cues provided; change to 100% with min cues; Partially met, d/c  4.  Pt will complete basic  level reading comprehension tasks (picture cues for sentence level) with 80% acc with provided mi/mod cues. Baseline: 100% word to picture matching f=5, 0/5 sentences Goal status: MET for single words, continue for sentences; Partially Met, use on [name]  5.  Pt will verbally describe action photos with use of "who" "what doing" template with 80% acc and min/mod cues.  Baseline: 70% mod cues. Goal status: MET for 100% acc with mod/max cues; change to 100% with mod cues; Met  6.  Pt will complete personally relevant responsive naming tasks with 90% acc with use of written cues as needed. Baseline: 75% acc Goal status: MET  7. Pt will use a communication [name] to cue themselves to speak (reading or imitating words) during 1:1 conversations with SLP with min/mod cues over 3 consecutive sessions.             Baseline: Max assist             Goal status: ONGOING   8. Pt will respond to personal preference questions by  answering with alternative communication [name] with 90% acc and min assist.             Baseline: Max assist             Goal status: ONGOING   9. Pt will use alternative means of communication (low tech and high tech) to maximize independence with communication and find a system that is most efficient for her.             Baseline: Trial of Lingraphica, Spoken App, letter board, etc             Goal status: ONGOING   10. Pt will use alternative means of communication to locate list of medications, health symptoms, and personal/bio information with indirect cues.             Baseline: mod assist             Goal status: ONGOING  LONG TERM GOALS: Target date: 07/25/2023  Pt will communicate moderately complex wants/needs to [facility] with min assist from communication partner and use of multimodality communication strategies. Baseline: Pt requires mod assist from communication partner  Goal status: ONGOING   ASSESSMENT:  PREVIOUS TREATMENT:     03/26/23: Pt accompanied to therapy this date by her caregiver, Robin [name] and her sister. They brought in a notebook and completed a good portion of the personalized communication template. SLP reviewed the information with Pt and Pt asked to read names of family members. Pt able to read 75% of the names and benefited from cues to look away/provide diversion and return to task when she could not read it right away. Family provided education on ways to help her with this at home. She was able to write the letters with her finger on the table 90% of the time and even orally spell when unable to say the word. Pt completed automatic speech tasks with 80% acc, but needed cues to decrease rate to increase accuracy of responses. Goals and evaluation were reviewed. Pt smiling and engaged throughout the session. She occasionally stated, "I can't do that", but with encouragement she tried to complete the request. Her sister commented that she seems to be able to  communicate with some family members better than others and SLP suggested that she ascertain the content of conversation (ie. Harder to talk about personal needs with sister than lighter conversation with an aunt). Target above goals next session.   04/03/2023:  Pt accompanied by caregiver, Robin [name] and brought in her notebook. Pt completed confrontation naming task with 50% acc without cues and increased to 90% acc with cues (Pt able to use letter board to spell the word 90% of the time). She completed single word sentence completion (SWSC) task by providing accurate verbal response when looking at pictures with 80% acc with mi/mod cues. She was then asked to repeat the word x3 and SLP interjected with confounds at times and Pt asked to return to word. This was very challenging for Pt with performance improved throughout trials. She was able to provide a gesture for each work with 100% acc. She answered responsive naming questions with use of personal communication template as needed with 95% acc. SLP provided strategies for accurate number production by having Pt count on her fingers and show the number on her hands and then verbalize the number, which appeared effective. Introduce the "Say it" set list of words next session.   04/09/2023: Pt accompanied by her caregiver, Robin [name]. Pt indicated that she went to the ED yesterday due to chest pain and nose bleed. She currently feels much better. She completed confrontation naming of high frequency words with 50% acc, single word sentence completion task with 100% acc with min cues, responsive naming with 90% and mod cues, and elicited appropriate gesture for words with 100% acc with min assist. She was cued to use her personalized communication template to help find answers to responsive naming tasks. During confrontation naming tasks, she benefited from being able to write the word, but had difficulty saying the word without phonemic and sentence cues. New words were  added to her communication template (purse, keys, phone, water, etc). Continue to target goals above and focus on responsive naming tasks for personal information.     04/14/2023: Pt accompanied by her caregiver, Robin [name]. She verbalized that she went to [place] over the weekend to see friends/family. She completed responsive naming tasks in conversation regarding personally relevant information with 90% acc with ~75% acc for verbal responses and 100% when allowed to write or use written cues. She completed confrontation naming task when looking at icons on a tablet with 70% acc with mod cues, but was able to repeat the name of the item after model provided, with 90% acc. She was introduced to the [place] tablet today and she expressed interest in using it to help her communicate. She indicates that she thinks she has something similar at home and will bring in next session. She was able to quickly point to 7 fruits that she likes to eat and was able to repeat 6/7 after model provided. Update goals next session.       04/17/23:  Pt was accompanied by a new caregiver today because Robin [name] had an appointment. Pt was able to communicate this to SLP with use of written personalized template to identify the name "Robin [name]". Robin [name] answered personally relevant responsive naming questions (regarding names of friends, family, and medical professionals) with 100% acc with use of communication support as needed and accurate verbal response 90% of the time with min cues. Pt verbalized a subject, verb/action, and object when looking at action photos with 92% acc with mi/mod cues and was then able to repeat with written model provided with 100% acc with min assist. She then completed single word sentence completion task with 100% acc with min cues. Robin [name] occasionally shuts down when a task feels out of her reach or too challenging and needs encouragement to  try the task. She was trying to verbalize "smelling" and produced  "smoking" instead, but was able to break the perseveration and produce "smelling" on ~70% of cued trials. She was given divergent naming task with word bank provided for homework and completed trial x 2 before ending the session. Next session, continue to expansive naming tasks to generate sentences when looking at photos.   04/23/2023: Pt accompanied to therapy by Robin [name]. She completed responsive naming task by answering personally relevant questions with use of written cues provided with 100% acc. She is unable to verbalize responses independently without use of written cues or initial phoneme cue from SLP. She named common objects with 100% acc when allowed to write her response or when given initial phoneme cue and she completed single word sentence completion tasks with 100% acc with provided mi/mod cues. She independently uses total communication strategies by gesturing, writing, or looking for written response when she is unable to verbalize her response. Unfortunately, she needs environmental supports via communication partner, written templates, gestures, and ability to hand write responses to communicate her thoughts. Pt would make an excellent candidate for a dynamic communication system. We have tried some apps on her phone, however she would benefit from a dedicated speech generating [name]. Goals have been updated and will request a trial of a communication [name]. Pt is in agreement with plan of care. Requesting 2x/week for 4 more weeks.   04/28/23: Pt accompanied by a different caregiver today and she was able to locate her name by finding it in her contacts on her phone. She completed responsive naming tasks with 100% acc with use of communication template, described action pictures using subject and verb with 100% acc with mod cues, completed SWSC tasks with 100% acc with min cues, confrontation naming tasks for high frequency words with 100% acc with mod/max cues. She was able to repeat 5 word  sentences with 100% acc when provided mi/mod cues from SLP. SLP submitted request for AAC [name] trials and Pt expressed excitement over having a more efficient way to help her communicate. Continue to target goals next session.   04/30/2023: SLP submitted request to check insurance benefits for alternative communication [name], awaiting response. Pt completed HEP. She used verbal responses, letter board, communication template, and self written responses to answer personally relevant responsive naming tasks with 94% acc with mi/mod use of non-verbal responses. She completed confrontation naming of high frequency pictures with 100% acc with mod cues. Pt expressed that mentally fatiguing for her to verbalize responses at times. SLP provided reassurance and positive reinforcement toward Pt gains this session. She continues to be motivated and uses alternative communication strategies (even able to spell words aloud when unable to verbalize the word) with indirect cues. Continue targeting goals and trials of AAC devices.   05/21/2023: Pt unable to attend therapy in the last few weeks due to scheduling conflicts with her ability to obtain transportation. She was accompanied to today's visit by her sister and her new caregiver. SLP introduced the [place] communication [name] today. Pt able to turn the [name] on and select icons with cues from SLP (orientation factors and the specific folders). SLP opened the page to breakfast items and Pt selected 10 items she enjoys eating and was encouraged to use the [name] to cue herself to speak if unable to verbalize the word. She assisted SLP in adding personally relevant information to her communication template that she would like to have on the [name]. Her schedule has  been updated and she will be able to attend more consistently and expressed feeling positive about using a speech generating [name] to assist with communication. [name] will need further personalization  before we begin a trial.   05/26/2023: Pt accompanied to therapy by her new caregiver. She communicated to me that her son was shot and is in the [name]. SLP showed Pt how to add icons and folders to the Robin [name]. Pt requested that we add "[employer] [place] [facility] [name]" and was able to then return demonstrate navigation of the [name] to select the icon and use it to prompt verbal production from her. She also identified two Gospel singers she likes and we added those icons. She was encouraged to verbalize her responses first when answering Wh-questions and then use the [name] to prompt speech. She verbalized that her son was "61" years old, however when she was prompted to write the age on the dry erase board feature, she wrote his correct age. Truc can often write the word she wants to say when she is unable to verbalize it. She was encouraged to use the camera feature at home to take photos to add to the Robin [name]. Continue with [name] navigation and personalization next session.   05/28/2023: Pt used the Lingraphica [name] at home by completing verbal expression activities in therapy section.  SLP facilitated session by adding additional icons and folders for Pt to express herself in session and with her caregivers at home. She was able to select food choices and restaurants with min assist once opened to the page. SLP cued the Pt to use the [name] to cue herself to speak (model from [name] and then attempt to repeat). She expressed that she would like to be able to vote, but didn't think that she was able to. SLP encouraged Pt to have someone from her family to take her to vote and she can request assist as needed. Pt smiled throughout the session and used multi-modality systems to communicate with SLP (gestures, verbal response, AAC [name], written responses, and occasionally letter board). Pt continues to be motivated and shows excellent ability to use an AAC. Continue with trials and goals have  been updated, cert request.  CURRENT TREATMENT: Pt arrived on RCAT without her caregiver this AM and she appeared pleased that she was able to accomplish this on her own. A new caregiver, Robin [name], arrived later on in our treatment session. SLP encouraged Pt to introduce herself to the new caregiver and use the [name] as needed to supplement. Pt required mod cues via offering binary choices (did you have a stroke or a heart attack?) to communicate to caregiver. Robin [name] added photos to the Robin [name] [name] and SLP facilitated adding these to her "people" page and needed to rotate pictures etc. SLP created a flow sheet for Pt to follow to learn how to add icons and move to different folders. Pt was able to return demonstrate with moderate cueing. She benefits from repetition of task and was encouraged to practice this at home, while fresh on her mind. She was able to answer responsive naming questions with use of [name] and verbal response with ~85% acc with min cues for [name] navigation. Next session, SLP will ask Pt to locate recently added icons to answer responsive naming questions and add a page for medical information.  DATE: 06/02/23  CLINICAL IMPRESSION: (from initial evaluation on 03/19/2023) Patient is a 57 y.o. female who was seen today for speech/language evaluation s/p CVA in May of 2021 with only limited follow up SLP therapy via home health in 2021 when she was staying with her daughter. [name] sister accompanied her to the evaluation and would like for her sister to be able to communicate her wants and needs verbally. Pt presents with moderate expressive aphasia and suspect apraxia and mi/mod receptive aphasia with relative strengths in her ability to write single word responses during naming tasks, use of gestures, automatic speech tasks, repetition of single, high frequency words,  responding accurately to basic yes/no questions, following basic 1 step directions, and object recognition. Pt has difficulty verbally labeling high frequency objects (but was able to write some responses), repeating longer words/phrases, and reading phrases. She was able to match 5/5 high frequency objects to single words, but was only able to orally read/label 1/5 words. Pt benefited from gesturing to communicate her intent and this occasionally helped elicit word production and she also benefited from SLP phonemic cues and single word sentence completion cues. Recommend SLP therapy to address communication deficits and increase independence with communication 2x/week for 4-8 weeks.  OBJECTIVE IMPAIRMENTS: include executive functioning, expressive language, receptive language, aphasia, and apraxia. These impairments are limiting patient from managing medications, managing appointments, managing finances, and effectively communicating at home and in community. Factors affecting potential to achieve goals and functional outcome are ability to learn/carryover information, severity of impairments, and time post onset of aphasia (2021) . Patient will benefit from skilled SLP services to address above impairments and improve overall function.  REHAB POTENTIAL: Good  PLAN:  SLP FREQUENCY: 2x/week  SLP DURATION: 8 weeks  PLANNED INTERVENTIONS: Cueing hierachy, Internal/external aids, Functional tasks, Multimodal communication approach, SLP instruction and feedback, Compensatory strategies, Patient/family education, and Re-evaluation    Thank you,  Havery Moros, CCC-SLP [phone]  Kerryann Allaire, CCC-SLP 06/02/2023, 4:53 PM

## 2023-06-04 ENCOUNTER — Encounter (HOSPITAL_COMMUNITY): Payer: Self-pay | Admitting: Speech Pathology

## 2023-06-04 ENCOUNTER — Ambulatory Visit (HOSPITAL_COMMUNITY): Payer: 59 | Admitting: Speech Pathology

## 2023-06-04 DIAGNOSIS — I69890 Apraxia following other cerebrovascular disease: Secondary | ICD-10-CM

## 2023-06-04 DIAGNOSIS — R4701 Aphasia: Secondary | ICD-10-CM | POA: Diagnosis not present

## 2023-06-04 DIAGNOSIS — R41841 Cognitive communication deficit: Secondary | ICD-10-CM

## 2023-06-04 NOTE — Therapy (Signed)
OUTPATIENT SPEECH LANGUAGE PATHOLOGY TREATMENT   Patient Name: Robin [name] MRN: [id number] DOB:1965-08-23, 57 y.o., female Today's Date: 06/04/2023  PCP: Jarold Motto, PA REFERRING PROVIDER: Jarold Motto, PA  END OF SESSION:  End of Session - 06/04/23 1113     Visit Number 14    Number of Visits 24    Date for SLP Re-Evaluation 07/03/23    Authorization Type UHC Medicare and Medicaid secondary; Dual complete   eff 08/05/22 ded 240 met oop 8850 met 2034.20 limit-no auth-no co ins-20%   SLP Start Time 1105    SLP Stop Time  1150    SLP Time Calculation (min) 45 min    Activity Tolerance Patient tolerated treatment well             Past Medical History:  Diagnosis Date   A-fib (HCC)    Abdominal aortic aneurysm (AAA) (HCC)    Allergy    Anemia    Arthritis    Atrial fibrillation (HCC)    CHF (congestive heart failure) (HCC)    a. EF at 30-35% by echo in 08/2018 b. EF at 35% by repeat echo in 05/2019 c. EF improved to 65-70% in 2021 d. EF at 40-45% in 08/2021.   Chronic abdominal pain    Chronic atrial fibrillation (HCC)    Cocaine abuse (HCC)    COPD (chronic obstructive pulmonary disease) (HCC)    Diabetes mellitus without complication (HCC)    Essential hypertension, benign    Expressive aphasia    Expressive aphasia    post CVA   Fatty liver    GERD (gastroesophageal reflux disease)    Gout 2016   Normal coronary arteries    3/10 - following abnormal Myoview   Ovarian cyst    Stroke (HCC) 12/26/2019   Right sided weakness, and expressive aphasia   Stroke [facility])    Thoracic ascending aortic aneurysm (HCC)    4 cm 10/31/19 CTA   Type 2 diabetes mellitus (HCC)    type II   Past Surgical History:  Procedure Laterality Date   ABDOMINAL HYSTERECTOMY  09/10/2011   Procedure: HYSTERECTOMY ABDOMINAL;  Surgeon: Tilda Burrow, MD;  Location: [facility];  Service: Gynecology;  Laterality: N/A;  Abdominal hysterectomy   CESAREAN SECTION  [phone], and 1994    CHOLECYSTECTOMY  1995   IR 3D INDEPENDENT WKST  03/16/2020   IR ANGIO INTRA EXTRACRAN SEL INTERNAL CAROTID BILAT MOD SED  03/16/2020   IR ANGIO VERTEBRAL SEL SUBCLAVIAN INNOMINATE UNI L MOD SED  03/16/2020   IR ANGIO VERTEBRAL SEL VERTEBRAL UNI R MOD SED  03/16/2020   IR CT HEAD LTD  12/27/2019   IR PERCUTANEOUS ART THROMBECTOMY/INFUSION INTRACRANIAL INC DIAG ANGIO  12/27/2019   IR US GUIDE VASC ACCESS RIGHT  12/27/2019   IR US GUIDE VASC ACCESS RIGHT  03/16/2020   RADIOLOGY WITH ANESTHESIA N/A 12/27/2019   Procedure: IR WITH ANESTHESIA;  Surgeon: Julieanne Cotton, MD;  Location: [facility];  Service: Radiology;  Laterality: N/A;   SCAR REVISION  09/10/2011   Procedure: SCAR REVISION;  Surgeon: Tilda Burrow, MD;  Location: [facility];  Service: Gynecology;  Laterality: N/A;  Wide Excision of old Cicatrix   TUBAL LIGATION  1994   Patient Active Problem List   Diagnosis Date Noted   Acute on chronic systolic CHF (congestive heart failure) (HCC) 11/27/2021   Stage 3b chronic kidney disease (CKD) (HCC) 11/27/2021   Microcytic anemia 11/27/2021   Lactic acidosis 11/27/2021  Diabetes mellitus (HCC) 03/09/2021   Type 2 diabetes mellitus with stage 3a chronic kidney disease, with long-term current use of insulin (HCC) 03/09/2021   Acute metabolic encephalopathy 12/31/2020   Hyperglycemia 12/31/2020   Prolonged QT interval 05/12/2020   History of CVA with residual deficit 05/12/2020   Atrial fibrillation, chronic (HCC) 05/12/2020   Right hemiparesis (HCC)    Essential hypertension    Chronic diastolic congestive heart failure (HCC)    Dysphagia due to recent stroke 01/05/2020   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 01/05/2020   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/p clot retrieval 12/27/2019   Thoracic aortic aneurysm without rupture (HCC)    Atrial fibrillation with RVR (HCC) 05/10/2019   Type 2 diabetes mellitus with diabetic autonomic neuropathy, with long-term current use of insulin  (HCC) 09/01/2018   Cocaine abuse (HCC) 08/25/2018   Cigarette nicotine dependence, uncomplicated 06/07/2015   GERD (gastroesophageal reflux disease) 06/07/2011   COPD (chronic obstructive pulmonary disease) (HCC) 06/07/2011   Arthritis 06/07/2011    ONSET DATE: May 2021   REFERRING DIAG: R47.01 (ICD-10-CM) - Expressive aphasia  THERAPY DIAG:  Aphasia  Apraxia following other cerebrovascular disease  Cognitive communication deficit  Rationale for Evaluation and Treatment: Rehabilitation  SUBJECTIVE:   SUBJECTIVE STATEMENT: "Robin [name]" (her son called)  Pt accompanied by: self and family member  PERTINENT HISTORY: Robin [name] is a pleasant 57 y.o. female with medical history significant for atrial fibrillation on Eliquis, chronic systolic CHF, CKD 3B, insulin-dependent diabetes mellitus, polysubstance abuse, COPD, GERD, HTN, and history of CVA with residual right-sided weakness and aphasia (L MCA CVA and L ICA occlusion s/p thrombectomy on 12/27/2019 followed by CIR stay where she received ST/OT/PT until d/c home on 02/02/20 and then received some [facility] SLP services). She is referred for outpatient SLP therapy by Jarold Motto, PA to facilitate increased independence with communication.  PAIN:  Are you having pain? No  PATIENT GOALS: Increase ability to talk  OBJECTIVE:   DIAGNOSTIC FINDINGS:  MRI 11/13/2020: IMPRESSION: No evidence of acute intracranial abnormality, including acute infarction.   Redemonstrated large chronic left MCA territory infarct.   Redemonstrated small chronic cortically based infarct within the posterior right temporal lobe.   Mild bilateral ethmoid sinus mucosal thickening.  PATIENT EDUCATION: Education details: Herbalist the [employer] educated: Patient and sister, [name] Education method: Explanation and Handouts Education comprehension: verbalized understanding and needs further education   GOALS: Goals reviewed with  patient? Yes  SHORT TERM GOALS: Target date: 07/10/2023  Pt will increase verbal naming of common objects/pictures to 75% acc when provided with mod multimodality cues Baseline: 80% when allowed to write response or provided mod/max cues Goal status: MET for 100% acc for mod/max cues (phonemic cue provided); change to 100% acc with mod cues; met   2.  Pt will describe objects and pictures by providing at least three salient features (single words ok) as judged by clinician with 80% acc when provided mod cues. Baseline: 70% mod/max cues Goal status: MET for 100% with mod/max cues provided; change to 100% acc with mod cues; met   3.  Pt will complete single word sentence completion tasks [facility]) with 90% acc when provided with min/mod multimodality cues.  Baseline: 75% mod cues of initial phoneme modeled Goal status: MET for 100% acc with mi/mod cues provided; change to 100% with min cues; Partially met, d/c  4.  Pt will complete basic level reading comprehension tasks (picture cues for sentence level) with 80% acc  with provided mi/mod cues. Baseline: 100% word to picture matching f=5, 0/5 sentences Goal status: MET for single words, continue for sentences; Partially Met, use on device  5.  Pt will verbally describe action photos with use of "who" "what doing" template with 80% acc and min/mod cues.  Baseline: 70% mod cues. Goal status: MET for 100% acc with mod/max cues; change to 100% with mod cues; Met  6.  Pt will complete personally relevant responsive naming tasks with 90% acc with use of written cues as needed. Baseline: 75% acc Goal status: MET  7. Pt will use a communication device to cue themselves to speak (reading or imitating words) during 1:1 conversations with SLP with min/mod cues over 3 consecutive sessions.             Baseline: Max assist             Goal status: ONGOING   8. Pt will respond to personal preference questions by answering with alternative communication  device with 90% acc and min assist.             Baseline: Max assist             Goal status: ONGOING   9. Pt will use alternative means of communication (low tech and high tech) to maximize independence with communication and find a system that is most efficient for her.             Baseline: Trial of Lingraphica, Spoken App, letter board, etc             Goal status: ONGOING   10. Pt will use alternative means of communication to locate list of medications, health symptoms, and personal/bio information with indirect cues.             Baseline: mod assist             Goal status: ONGOING  LONG TERM GOALS: Target date: 07/25/2023  Pt will communicate moderately complex wants/needs to [facility] with min assist from communication partner and use of multimodality communication strategies. Baseline: Pt requires mod assist from communication partner  Goal status: ONGOING   ASSESSMENT:  PREVIOUS TREATMENT:     03/26/23: Pt accompanied to therapy this date by her caregiver, Robin [name] and her sister. They brought in a notebook and completed a good portion of the personalized communication template. SLP reviewed the information with Pt and Pt asked to read names of family members. Pt able to read 75% of the names and benefited from cues to look away/provide diversion and return to task when she could not read it right away. Family provided education on ways to help her with this at home. She was able to write the letters with her finger on the table 90% of the time and even orally spell when unable to say the word. Pt completed automatic speech tasks with 80% acc, but needed cues to decrease rate to increase accuracy of responses. Goals and evaluation were reviewed. Pt smiling and engaged throughout the session. She occasionally stated, "I can't do that", but with encouragement she tried to complete the request. Her sister commented that she seems to be able to communicate with some family members better than  others and SLP suggested that she ascertain the content of conversation (ie. Harder to talk about personal needs with sister than lighter conversation with an aunt). Target above goals next session.   04/03/2023: Pt accompanied by caregiver, Robin [name] and brought in her notebook. Pt completed  confrontation naming task with 50% acc without cues and increased to 90% acc with cues (Pt able to use letter board to spell the word 90% of the time). She completed single word sentence completion (SWSC) task by providing accurate verbal response when looking at pictures with 80% acc with mi/mod cues. She was then asked to repeat the word x3 and SLP interjected with confounds at times and Pt asked to return to word. This was very challenging for Pt with performance improved throughout trials. She was able to provide a gesture for each work with 100% acc. She answered responsive naming questions with use of personal communication template as needed with 95% acc. SLP provided strategies for accurate number production by having Pt count on her fingers and show the number on her hands and then verbalize the number, which appeared effective. Introduce the "Say it" set list of words next session.   04/09/2023: Pt accompanied by her caregiver, Robin [name]. Pt indicated that she went to the ED yesterday due to chest pain and nose bleed. She currently feels much better. She completed confrontation naming of high frequency words with 50% acc, single word sentence completion task with 100% acc with min cues, responsive naming with 90% and mod cues, and elicited appropriate gesture for words with 100% acc with min assist. She was cued to use her personalized communication template to help find answers to responsive naming tasks. During confrontation naming tasks, she benefited from being able to write the word, but had difficulty saying the word without phonemic and sentence cues. New words were added to her communication template (purse,  keys, phone, water, etc). Continue to target goals above and focus on responsive naming tasks for personal information.     04/14/2023: Pt accompanied by her caregiver, Robin [name]. She verbalized that she went to [name] over the weekend to see friends/family. She completed responsive naming tasks in conversation regarding personally relevant information with 90% acc with ~75% acc for verbal responses and 100% when allowed to write or use written cues. She completed confrontation naming task when looking at icons on a tablet with 70% acc with mod cues, but was able to repeat the name of the item after model provided, with 90% acc. She was introduced to the [place] tablet today and she expressed interest in using it to help her communicate. She indicates that she thinks she has something similar at home and will bring in next session. She was able to quickly point to 7 fruits that she likes to eat and was able to repeat 6/7 after model provided. Update goals next session.       04/17/23:  Pt was accompanied by a new caregiver today because Robin [name] had an appointment. Pt was able to communicate this to SLP with use of written personalized template to identify the name "Robin [name]". [name] answered personally relevant responsive naming questions (regarding names of friends, family, and medical professionals) with 100% acc with use of communication support as needed and accurate verbal response 90% of the time with min cues. Pt verbalized a subject, verb/action, and object when looking at action photos with 92% acc with mi/mod cues and was then able to repeat with written model provided with 100% acc with min assist. She then completed single word sentence completion task with 100% acc with min cues. [place] [name] occasionally shuts down when a task feels out of her reach or too challenging and needs encouragement to try the task. She was trying to verbalize "smelling" and produced "smoking"  instead, but was able to break the  perseveration and produce "smelling" on ~70% of cued trials. She was given divergent naming task with word bank provided for homework and completed trial x 2 before ending the session. Next session, continue to expansive naming tasks to generate sentences when looking at photos.   04/23/2023: Pt accompanied to therapy by [name]. She completed responsive naming task by answering personally relevant questions with use of written cues provided with 100% acc. She is unable to verbalize responses independently without use of written cues or initial phoneme cue from SLP. She named common objects with 100% acc when allowed to write her response or when given initial phoneme cue and she completed single word sentence completion tasks with 100% acc with provided mi/mod cues. She independently uses total communication strategies by gesturing, writing, or looking for written response when she is unable to verbalize her response. Unfortunately, she needs environmental supports via communication partner, written templates, gestures, and ability to hand write responses to communicate her thoughts. Pt would make an excellent candidate for a dynamic communication system. We have tried some apps on her phone, however she would benefit from a dedicated speech generating device. Goals have been updated and will request a trial of a communication device. Pt is in agreement with plan of care. Requesting 2x/week for 4 more weeks.   04/28/23: Pt accompanied by a different caregiver today and she was able to locate her name by finding it in her contacts on her phone. She completed responsive naming tasks with 100% acc with use of communication template, described action pictures using subject and verb with 100% acc with mod cues, completed SWSC tasks with 100% acc with min cues, confrontation naming tasks for high frequency words with 100% acc with mod/max cues. She was able to repeat 5 word sentences with 100% acc when provided mi/mod  cues from SLP. SLP submitted request for AAC device trials and Pt expressed excitement over having a more efficient way to help her communicate. Continue to target goals next session.   04/30/2023: SLP submitted request to check insurance benefits for alternative communication device, awaiting response. Pt completed HEP. She used verbal responses, letter board, communication template, and self written responses to answer personally relevant responsive naming tasks with 94% acc with mi/mod use of non-verbal responses. She completed confrontation naming of high frequency pictures with 100% acc with mod cues. Pt expressed that mentally fatiguing for her to verbalize responses at times. SLP provided reassurance and positive reinforcement toward Pt gains this session. She continues to be motivated and uses alternative communication strategies (even able to spell words aloud when unable to verbalize the word) with indirect cues. Continue targeting goals and trials of AAC devices.   05/21/2023: Pt unable to attend therapy in the last few weeks due to scheduling conflicts with her ability to obtain transportation. She was accompanied to today's visit by her sister and her new caregiver. SLP introduced the [place] communication device today. Pt able to turn the device on and select icons with cues from SLP (orientation factors and the specific folders). SLP opened the page to breakfast items and Pt selected 10 items she enjoys eating and was encouraged to use the device to cue herself to speak if unable to verbalize the word. She assisted SLP in adding personally relevant information to her communication template that she would like to have on the device. Her schedule has been updated and she will be able to attend more consistently and  expressed feeling positive about using a speech generating device to assist with communication. Device will need further personalization before we begin a trial.   05/26/2023: Pt  accompanied to therapy by her new caregiver. She communicated to me that her son was shot and is in the hospital. SLP showed Pt how to add icons and folders to the [place] device. Pt requested that we add "Diet [facility]" and was able to then return demonstrate navigation of the device to select the icon and use it to prompt verbal production from her. She also identified two Gospel singers she likes and we added those icons. She was encouraged to verbalize her responses first when answering Wh-questions and then use the device to prompt speech. She verbalized that her son was "56" years old, however when she was prompted to write the age on the dry erase board feature, she wrote his correct age. Robin [name] can often write the word she wants to say when she is unable to verbalize it. She was encouraged to use the camera feature at home to take photos to add to the [employer]. Continue with device navigation and personalization next session.   05/28/2023: Pt used the Lingraphica device at home by completing verbal expression activities in therapy section.  SLP facilitated session by adding additional icons and folders for Pt to express herself in session and with her caregivers at home. She was able to select food choices and restaurants with min assist once opened to the page. SLP cued the Pt to use the device to cue herself to speak (model from device and then attempt to repeat). She expressed that she would like to be able to vote, but didn't think that she was able to. SLP encouraged Pt to have someone from her family to take her to vote and she can request assist as needed. Pt smiled throughout the session and used multi-modality systems to communicate with SLP (gestures, verbal response, AAC device, written responses, and occasionally letter board). Pt continues to be motivated and shows excellent ability to use an AAC. Continue with trials and goals have been updated, cert request.  06/02/2023:  Pt arrived on [facility] without her caregiver this AM and she appeared pleased that she was able to accomplish this on her own. A new caregiver, Robin [name], arrived later on in our treatment session. SLP encouraged Pt to introduce herself to the new caregiver and use the device as needed to supplement. Pt required mod cues via offering binary choices (did you have a stroke or a heart attack?) to communicate to caregiver. Robin [name] added photos to the [place] device and SLP facilitated adding these to her "people" page and needed to rotate pictures etc. SLP created a flow sheet for Pt to follow to learn how to add icons and move to different folders. Pt was able to return demonstrate with moderate cueing. She benefits from repetition of task and was encouraged to practice this at home, while fresh on her mind. She was able to answer responsive naming questions with use of device and verbal response with ~85% acc with min cues for device navigation. Next session, SLP will ask Pt to locate recently added icons to answer responsive naming questions and add a page for medical information.           CURRENT TREATMENT: [facility] did not pick Pt up for her appointment this AM, but we were able to reschedule her for a later time today. She was cued to refrain from pointing/gesturing  when she wanted an action (pick up her bag, bring her phone, order food) and to verbalize first. She was able to produce single words to convey meaning. Session focused on navigation of Lingraphica device and adding new icons to report symptoms. Robin [name] was able to turn on the device, locate the "people" tab and relay who her medical team and caregivers included. She identified three pain descriptors she wanted added: left shoulder pain, bilateral knee pain, and left toe pain. SLP also added stroke related information. We had difficulty copying and moving icons to other folders, so SLP will reach out to Lingraphica to facilitate this. Pt is more efficiently  able to use the Lingraphica device over the letter board and written template, however SLP continues to encourage Pt to use all methods of communication at this time. Continue plan of care and address goals as stated above.                                                                                                       DATE: 06/04/23  CLINICAL IMPRESSION: (from initial evaluation on 03/19/2023) Patient is a 57 y.o. female who was seen today for speech/language evaluation s/p CVA in May of 2021 with only limited follow up SLP therapy via home health in 2021 when she was staying with her daughter. [name] sister accompanied her to the evaluation and would like for her sister to be able to communicate her wants and needs verbally. 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 Pt has difficulty verbally labeling high frequency objects (but was able to write some responses), repeating longer words/phrases, and reading phrases. She was able to match 5/5 high frequency objects to single words, but was only able to orally read/label 1/5 words. Pt benefited from gesturing to communicate her intent and this occasionally helped elicit word production and she also benefited from SLP phonemic cues and single word sentence completion cues. Recommend SLP therapy to address communication deficits and increase independence with communication 2x/week for 4-8 weeks.  OBJECTIVE IMPAIRMENTS: include executive functioning, expressive language, receptive language, aphasia, and apraxia. These impairments are limiting patient from managing medications, managing appointments, managing finances, and  effectively communicating at home and in community. Factors affecting potential to achieve goals and functional outcome are ability to learn/carryover information, severity of impairments, and time post onset of aphasia (2021) . Patient will benefit from skilled SLP services to address above impairments and improve overall function.  REHAB POTENTIAL: Good  PLAN:  SLP FREQUENCY: 2x/week  SLP DURATION: 8 weeks  PLANNED INTERVENTIONS: Cueing hierachy, Internal/external aids, Functional tasks, Multimodal communication approach, SLP instruction and feedback, Compensatory strategies, Patient/family education, and Re-evaluation    Thank you,  Havery Moros, CCC-SLP [phone]  Trevin Gartrell, CCC-SLP 06/04/2023, 11:14 AM

## 2023-06-09 ENCOUNTER — Encounter (HOSPITAL_COMMUNITY): Payer: Self-pay | Admitting: Speech Pathology

## 2023-06-09 ENCOUNTER — Ambulatory Visit (HOSPITAL_COMMUNITY): Payer: 59 | Attending: Physician Assistant | Admitting: Speech Pathology

## 2023-06-09 DIAGNOSIS — I69890 Apraxia following other cerebrovascular disease: Secondary | ICD-10-CM | POA: Insufficient documentation

## 2023-06-09 DIAGNOSIS — R4701 Aphasia: Secondary | ICD-10-CM | POA: Diagnosis present

## 2023-06-09 DIAGNOSIS — R41841 Cognitive communication deficit: Secondary | ICD-10-CM | POA: Diagnosis present

## 2023-06-09 NOTE — Therapy (Signed)
OUTPATIENT SPEECH LANGUAGE PATHOLOGY TREATMENT   Patient Name: Robin [name] MRN: [id number] DOB:05-09-1966, 57 y.o., female Today's Date: 06/09/2023  PCP: Robin Motto, PA REFERRING PROVIDER: Jarold Motto, PA  END OF SESSION:  End of Session - 06/09/23 0935     Visit Number 15    Number of Visits 24    Date for SLP Re-Evaluation 07/03/23    Authorization Type [employer] Medicare and Medicaid secondary; Dual complete   eff 08/05/22 ded 240 met oop 8850 met 2034.20 limit-no auth-no co ins-20%   SLP Start Time 0930    SLP Stop Time  1015    SLP Time Calculation (min) 45 min    Activity Tolerance Patient tolerated treatment well             Past Medical History:  Diagnosis Date   A-fib (HCC)    Abdominal aortic aneurysm (AAA) (HCC)    Allergy    Anemia    Arthritis    Atrial fibrillation (HCC)    CHF (congestive heart failure) (HCC)    a. EF at 30-35% by echo in 08/2018 b. EF at 35% by repeat echo in 05/2019 c. EF improved to 65-70% in 2021 d. EF at 40-45% in 08/2021.   Chronic abdominal pain    Chronic atrial fibrillation (HCC)    Cocaine abuse (HCC)    COPD (chronic obstructive pulmonary disease) (HCC)    Diabetes mellitus without complication (HCC)    Essential hypertension, benign    Expressive aphasia    Expressive aphasia    post CVA   Fatty liver    GERD (gastroesophageal reflux disease)    Gout 2016   Normal coronary arteries    3/10 - following abnormal Myoview   Ovarian cyst    Stroke (HCC) 12/26/2019   Right sided weakness, and expressive aphasia   Stroke [facility])    Thoracic ascending aortic aneurysm (HCC)    4 cm 10/31/19 CTA   Type 2 diabetes mellitus (HCC)    type II   Past Surgical History:  Procedure Laterality Date   ABDOMINAL HYSTERECTOMY  09/10/2011   Procedure: HYSTERECTOMY ABDOMINAL;  Surgeon: Robin Burrow, MD;  Location: [facility];  Service: Gynecology;  Laterality: N/A;  Abdominal hysterectomy   CESAREAN SECTION  [id number], and 1994    CHOLECYSTECTOMY  1995   IR 3D INDEPENDENT WKST  03/16/2020   IR ANGIO INTRA EXTRACRAN SEL INTERNAL CAROTID BILAT MOD SED  03/16/2020   IR ANGIO VERTEBRAL SEL SUBCLAVIAN INNOMINATE UNI L MOD SED  03/16/2020   IR ANGIO VERTEBRAL SEL VERTEBRAL UNI R MOD SED  03/16/2020   IR CT HEAD LTD  12/27/2019   IR PERCUTANEOUS ART THROMBECTOMY/INFUSION INTRACRANIAL INC DIAG ANGIO  12/27/2019   IR US GUIDE VASC ACCESS RIGHT  12/27/2019   IR US GUIDE VASC ACCESS RIGHT  03/16/2020   RADIOLOGY WITH ANESTHESIA N/A 12/27/2019   Procedure: IR WITH ANESTHESIA;  Surgeon: Robin Cotton, MD;  Location: [facility];  Service: Radiology;  Laterality: N/A;   SCAR REVISION  09/10/2011   Procedure: SCAR REVISION;  Surgeon: Robin Burrow, MD;  Location: [facility];  Service: Gynecology;  Laterality: N/A;  Wide Excision of old Cicatrix   TUBAL LIGATION  1994   Patient Active Problem List   Diagnosis Date Noted   Acute on chronic systolic CHF (congestive heart failure) (HCC) 11/27/2021   Stage 3b chronic kidney disease (CKD) (HCC) 11/27/2021   Microcytic anemia 11/27/2021   Lactic acidosis 11/27/2021  Diabetes mellitus (HCC) 03/09/2021   Type 2 diabetes mellitus with stage 3a chronic kidney disease, with long-term current use of insulin (HCC) 03/09/2021   Acute metabolic encephalopathy 12/31/2020   Hyperglycemia 12/31/2020   Prolonged QT interval 05/12/2020   History of CVA with residual deficit 05/12/2020   Atrial fibrillation, chronic (HCC) 05/12/2020   Right hemiparesis (HCC)    Essential hypertension    Chronic diastolic congestive heart failure (HCC)    Dysphagia due to recent stroke 01/05/2020   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 01/05/2020   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 12/27/2019   Thoracic aortic aneurysm without rupture (HCC)    Atrial fibrillation with RVR (HCC) 05/10/2019   Type 2 diabetes mellitus with diabetic autonomic neuropathy, with long-term current use of insulin  (HCC) 09/01/2018   Cocaine abuse (HCC) 08/25/2018   Cigarette nicotine dependence, uncomplicated 06/07/2015   GERD (gastroesophageal reflux disease) 06/07/2011   COPD (chronic obstructive pulmonary disease) (HCC) 06/07/2011   Arthritis 06/07/2011    ONSET DATE: May 2021   REFERRING DIAG: R47.01 (ICD-10-CM) - Expressive aphasia  THERAPY DIAG:  Aphasia  Apraxia following other cerebrovascular disease  Cognitive communication deficit  Rationale for Evaluation and Treatment: Rehabilitation  SUBJECTIVE:   SUBJECTIVE STATEMENT: "Let me see."  Pt accompanied by: self and family member  PERTINENT HISTORY: Robin [name] is a pleasant 57 y.o. female with medical history significant for atrial fibrillation on Eliquis, chronic systolic CHF, CKD 3B, insulin-dependent diabetes mellitus, polysubstance abuse, COPD, GERD, HTN, and history of CVA with residual right-sided weakness and aphasia (L MCA CVA and L ICA occlusion s/p thrombectomy on 12/27/2019 followed by CIR stay where she received ST/OT/PT until d/c home on 02/02/20 and then received some [facility] SLP services). She is referred for outpatient SLP therapy by Robin Motto, PA to facilitate increased independence with communication.  PAIN:  Are you having pain? No  PATIENT GOALS: Increase ability to talk  OBJECTIVE:   DIAGNOSTIC FINDINGS:  MRI 11/13/2020: IMPRESSION: No evidence of acute intracranial abnormality, including acute infarction.   Redemonstrated large chronic left MCA territory infarct.   Redemonstrated small chronic cortically based infarct within the posterior right temporal lobe.   Mild bilateral ethmoid sinus mucosal thickening.  PATIENT EDUCATION: Education details: Herbalist the [employer] educated: Patient and sister, Robin [name] Education method: Explanation and Handouts Education comprehension: verbalized understanding and needs further education   GOALS: Goals reviewed with patient?  Yes  SHORT TERM GOALS: Target date: 07/10/2023  Pt will increase verbal naming of common objects/pictures to 75% acc when provided with mod multimodality cues Baseline: 80% when allowed to write response or provided mod/max cues Goal status: MET for 100% acc for mod/max cues (phonemic cue provided); change to 100% acc with mod cues; met   2.  Pt will describe objects and pictures by providing at least three salient features (single words ok) as judged by clinician with 80% acc when provided mod cues. Baseline: 70% mod/max cues Goal status: MET for 100% with mod/max cues provided; change to 100% acc with mod cues; met   3.  Pt will complete single word sentence completion tasks [facility]) with 90% acc when provided with min/mod multimodality cues.  Baseline: 75% mod cues of initial phoneme modeled Goal status: MET for 100% acc with mi/mod cues provided; change to 100% with min cues; Partially met, d/c  4.  Pt will complete basic level reading comprehension tasks (picture cues for sentence level) with 80% acc with  provided mi/mod cues. Baseline: 100% word to picture matching f=5, 0/5 sentences Goal status: MET for single words, continue for sentences; Partially Met, use on device  5.  Pt will verbally describe action photos with use of "who" "what doing" template with 80% acc and min/mod cues.  Baseline: 70% mod cues. Goal status: MET for 100% acc with mod/max cues; change to 100% with mod cues; Met  6.  Pt will complete personally relevant responsive naming tasks with 90% acc with use of written cues as needed. Baseline: 75% acc Goal status: MET  7. Pt will use a communication device to cue themselves to speak (reading or imitating words) during 1:1 conversations with SLP with min/mod cues over 3 consecutive sessions.             Baseline: Max assist             Goal status: ONGOING   8. Pt will respond to personal preference questions by answering with alternative communication device  with 90% acc and min assist.             Baseline: Max assist             Goal status: ONGOING   9. Pt will use alternative means of communication (low tech and high tech) to maximize independence with communication and find a system that is most efficient for her.             Baseline: Trial of Lingraphica, Spoken App, letter board, etc             Goal status: ONGOING   10. Pt will use alternative means of communication to locate list of medications, health symptoms, and personal/bio information with indirect cues.             Baseline: mod assist             Goal status: ONGOING  LONG TERM GOALS: Target date: 07/25/2023  Pt will communicate moderately complex wants/needs to [facility] with min assist from communication partner and use of multimodality communication strategies. Baseline: Pt requires mod assist from communication partner  Goal status: ONGOING   ASSESSMENT:  PREVIOUS TREATMENT:     03/26/23: Pt accompanied to therapy this date by her caregiver, Agustin Cree and her sister. They brought in a notebook and completed a good portion of the personalized communication template. SLP reviewed the information with Pt and Pt asked to read names of family members. Pt able to read 75% of the names and benefited from cues to look away/provide diversion and return to task when she could not read it right away. Family provided education on ways to help her with this at home. She was able to write the letters with her finger on the table 90% of the time and even orally spell when unable to say the word. Pt completed automatic speech tasks with 80% acc, but needed cues to decrease rate to increase accuracy of responses. Goals and evaluation were reviewed. Pt smiling and engaged throughout the session. She occasionally stated, "I can't do that", but with encouragement she tried to complete the request. Her sister commented that she seems to be able to communicate with some family members better than others  and SLP suggested that she ascertain the content of conversation (ie. Harder to talk about personal needs with sister than lighter conversation with an aunt). Target above goals next session.   04/03/2023: Pt accompanied by caregiver, Agustin Cree and brought in her notebook. Pt completed confrontation  naming task with 50% acc without cues and increased to 90% acc with cues (Pt able to use letter board to spell the word 90% of the time). She completed single word sentence completion (SWSC) task by providing accurate verbal response when looking at pictures with 80% acc with mi/mod cues. She was then asked to repeat the word x3 and SLP interjected with confounds at times and Pt asked to return to word. This was very challenging for Pt with performance improved throughout trials. She was able to provide a gesture for each work with 100% acc. She answered responsive naming questions with use of personal communication template as needed with 95% acc. SLP provided strategies for accurate number production by having Pt count on her fingers and show the number on her hands and then verbalize the number, which appeared effective. Introduce the "Say it" set list of words next session.   04/09/2023: Pt accompanied by her caregiver, Darlene. Pt indicated that she went to the ED yesterday due to chest pain and nose bleed. She currently feels much better. She completed confrontation naming of high frequency words with 50% acc, single word sentence completion task with 100% acc with min cues, responsive naming with 90% and mod cues, and elicited appropriate gesture for words with 100% acc with min assist. She was cued to use her personalized communication template to help find answers to responsive naming tasks. During confrontation naming tasks, she benefited from being able to write the word, but had difficulty saying the word without phonemic and sentence cues. New words were added to her communication template (purse, keys,  phone, water, etc). Continue to target goals above and focus on responsive naming tasks for personal information.     04/14/2023: Pt accompanied by her caregiver, Darlene. She verbalized that she went to [place] over the weekend to see friends/family. She completed responsive naming tasks in conversation regarding personally relevant information with 90% acc with ~75% acc for verbal responses and 100% when allowed to write or use written cues. She completed confrontation naming task when looking at icons on a tablet with 70% acc with mod cues, but was able to repeat the name of the item after model provided, with 90% acc. She was introduced to the [place] tablet today and she expressed interest in using it to help her communicate. She indicates that she thinks she has something similar at home and will bring in next session. She was able to quickly point to 7 fruits that she likes to eat and was able to repeat 6/7 after model provided. Update goals next session.       04/17/23:  Pt was accompanied by a new caregiver today because Darlene had an appointment. Pt was able to communicate this to SLP with use of written personalized template to identify the name "Darlene". Harriett Sine answered personally relevant responsive naming questions (regarding names of friends, family, and medical professionals) with 100% acc with use of communication support as needed and accurate verbal response 90% of the time with min cues. Pt verbalized a subject, verb/action, and object when looking at action photos with 92% acc with mi/mod cues and was then able to repeat with written model provided with 100% acc with min assist. She then completed single word sentence completion task with 100% acc with min cues. Kieran occasionally shuts down when a task feels out of her reach or too challenging and needs encouragement to try the task. She was trying to verbalize "smelling" and produced "smoking" instead,  but was able to break the  perseveration and produce "smelling" on ~70% of cued trials. She was given divergent naming task with word bank provided for homework and completed trial x 2 before ending the session. Next session, continue to expansive naming tasks to generate sentences when looking at photos.   04/23/2023: Pt accompanied to therapy by Darlene. She completed responsive naming task by answering personally relevant questions with use of written cues provided with 100% acc. She is unable to verbalize responses independently without use of written cues or initial phoneme cue from SLP. She named common objects with 100% acc when allowed to write her response or when given initial phoneme cue and she completed single word sentence completion tasks with 100% acc with provided mi/mod cues. She independently uses total communication strategies by gesturing, writing, or looking for written response when she is unable to verbalize her response. Unfortunately, she needs environmental supports via communication partner, written templates, gestures, and ability to hand write responses to communicate her thoughts. Pt would make an excellent candidate for a dynamic communication system. We have tried some apps on her phone, however she would benefit from a dedicated speech generating device. Goals have been updated and will request a trial of a communication device. Pt is in agreement with plan of care. Requesting 2x/week for 4 more weeks.   04/28/23: Pt accompanied by a different caregiver today and she was able to locate her name by finding it in her contacts on her phone. She completed responsive naming tasks with 100% acc with use of communication template, described action pictures using subject and verb with 100% acc with mod cues, completed SWSC tasks with 100% acc with min cues, confrontation naming tasks for high frequency words with 100% acc with mod/max cues. She was able to repeat 5 word sentences with 100% acc when provided mi/mod  cues from SLP. SLP submitted request for AAC device trials and Pt expressed excitement over having a more efficient way to help her communicate. Continue to target goals next session.   04/30/2023: SLP submitted request to check insurance benefits for alternative communication device, awaiting response. Pt completed HEP. She used verbal responses, letter board, communication template, and self written responses to answer personally relevant responsive naming tasks with 94% acc with mi/mod use of non-verbal responses. She completed confrontation naming of high frequency pictures with 100% acc with mod cues. Pt expressed that mentally fatiguing for her to verbalize responses at times. SLP provided reassurance and positive reinforcement toward Pt gains this session. She continues to be motivated and uses alternative communication strategies (even able to spell words aloud when unable to verbalize the word) with indirect cues. Continue targeting goals and trials of AAC devices.   05/21/2023: Pt unable to attend therapy in the last few weeks due to scheduling conflicts with her ability to obtain transportation. She was accompanied to today's visit by her sister and her new caregiver. SLP introduced the [place] communication device today. Pt able to turn the device on and select icons with cues from SLP (orientation factors and the specific folders). SLP opened the page to breakfast items and Pt selected 10 items she enjoys eating and was encouraged to use the device to cue herself to speak if unable to verbalize the word. She assisted SLP in adding personally relevant information to her communication template that she would like to have on the device. Her schedule has been updated and she will be able to attend more consistently and expressed  feeling positive about using a speech generating device to assist with communication. Device will need further personalization before we begin a trial.   05/26/2023: Pt  accompanied to therapy by her new caregiver. She communicated to me that her son was shot and is in the hospital. SLP showed Pt how to add icons and folders to the [place] device. Pt requested that we add "Diet [facility]" and was able to then return demonstrate navigation of the device to select the icon and use it to prompt verbal production from her. She also identified two Gospel singers she likes and we added those icons. She was encouraged to verbalize her responses first when answering Wh-questions and then use the device to prompt speech. She verbalized that her son was "38" years old, however when she was prompted to write the age on the dry erase board feature, she wrote his correct age. Jasman can often write the word she wants to say when she is unable to verbalize it. She was encouraged to use the camera feature at home to take photos to add to the [employer]. Continue with device navigation and personalization next session.   05/28/2023: Pt used the Lingraphica device at home by completing verbal expression activities in therapy section.  SLP facilitated session by adding additional icons and folders for Pt to express herself in session and with her caregivers at home. She was able to select food choices and restaurants with min assist once opened to the page. SLP cued the Pt to use the device to cue herself to speak (model from device and then attempt to repeat). She expressed that she would like to be able to vote, but didn't think that she was able to. SLP encouraged Pt to have someone from her family to take her to vote and she can request assist as needed. Pt smiled throughout the session and used multi-modality systems to communicate with SLP (gestures, verbal response, AAC device, written responses, and occasionally letter board). Pt continues to be motivated and shows excellent ability to use an AAC. Continue with trials and goals have been updated, cert request.  06/02/2023:  Pt arrived on [facility] without her caregiver this AM and she appeared pleased that she was able to accomplish this on her own. A new caregiver, Raechel Ache, arrived later on in our treatment session. SLP encouraged Pt to introduce herself to the new caregiver and use the device as needed to supplement. Pt required mod cues via offering binary choices (did you have a stroke or a heart attack?) to communicate to caregiver. Harriett Sine added photos to the [employer] device and SLP facilitated adding these to her "people" page and needed to rotate pictures etc. SLP created a flow sheet for Pt to follow to learn how to add icons and move to different folders. Pt was able to return demonstrate with moderate cueing. She benefits from repetition of task and was encouraged to practice this at home, while fresh on her mind. She was able to answer responsive naming questions with use of device and verbal response with ~85% acc with min cues for device navigation. Next session, SLP will ask Pt to locate recently added icons to answer responsive naming questions and add a page for medical information.           06/05/2023: [facility] did not pick Pt up for her appointment this AM, but we were able to reschedule her for a later time today. She was cued to refrain from pointing/gesturing when she  wanted an action (pick up her bag, bring her phone, order food) and to verbalize first. She was able to produce single words to convey meaning. Session focused on navigation of Lingraphica device and adding new icons to report symptoms. Jacklynn was able to turn on the device, locate the "people" tab and relay who her medical team and caregivers included. She identified three pain descriptors she wanted added: left shoulder pain, bilateral knee pain, and left toe pain. SLP also added stroke related information. We had difficulty copying and moving icons to other folders, so SLP will reach out to Lingraphica to facilitate this. Pt is more efficiently able to  use the Lingraphica device over the letter board and written template, however SLP continues to encourage Pt to use all methods of communication at this time. Continue plan of care and address goals as stated above.   CURRENT TREATMENT: Eknoor was accompanied by her caregiver, Mae. She was able to turn on the Lingraphica device and navigate to specific folders with indirect cues. She added three phots of items in her home: blender, TV, and her bed. We created an icon for blender and placed in the "kitchen" folder. She was able to then locate the icon, but it did require about 5 steps to arrive to location. She answered personally relevant responsive naming questions with use of device. When SLP asked if she voted, she said yes and that her "Cathren Harsh" took her, however SLP then cued her to use the device to locate the icon of who took her. She then accurately identified her sister. Once SLP assisted with navigation to the restaurants page, she was able to select the placed she goes to and got rid of the ones she does not. Under the [employer] icon, we changed it to a folder and she added: fish sandwich, fries, and sweet tea. She navigated to the medical information page and showed current body aches. Continue to have Pt navigate the device and communicate more complex ideas.                                                                                                      DATE: 06/09/23  CLINICAL IMPRESSION: (from initial evaluation on 03/19/2023) Patient is a 57 y.o. female who was seen today for speech/language evaluation s/p CVA in May of 2021 with only limited follow up SLP therapy via home health in 2021 when she was staying with her daughter. [name] sister accompanied her to the evaluation and would like for her sister to be able to communicate her wants and needs verbally. Pt presents with moderate expressive aphasia and suspect apraxia and mi/mod receptive aphasia with relative strengths in her ability to  write single word responses during naming tasks, use of gestures, automatic speech tasks, repetition of single, high frequency words, responding accurately to basic yes/no questions, following basic 1 step directions, and object recognition. Pt has difficulty verbally labeling high frequency objects (but was able to write some responses), repeating longer words/phrases, and reading phrases. She was able to match 5/5 high frequency objects to single words,  but was only able to orally read/label 1/5 words. Pt benefited from gesturing to communicate her intent and this occasionally helped elicit word production and she also benefited from SLP phonemic cues and single word sentence completion cues. Recommend SLP therapy to address communication deficits and increase independence with communication 2x/week for 4-8 weeks.  OBJECTIVE IMPAIRMENTS: include executive functioning, expressive language, receptive language, aphasia, and apraxia. These impairments are limiting patient from managing medications, managing appointments, managing finances, and effectively communicating at home and in community. Factors affecting potential to achieve goals and functional outcome are ability to learn/carryover information, severity of impairments, and time post onset of aphasia (2021) . Patient will benefit from skilled SLP services to address above impairments and improve overall function.  REHAB POTENTIAL: Good  PLAN:  SLP FREQUENCY: 2x/week  SLP DURATION: 8 weeks  PLANNED INTERVENTIONS: Cueing hierachy, Internal/external aids, Functional tasks, Multimodal communication approach, SLP instruction and feedback, Compensatory strategies, Patient/family education, and Re-evaluation    Thank you,  Havery Moros, CCC-SLP [phone]  Brea Coleson, CCC-SLP 06/09/2023, 10:08 AM

## 2023-06-11 ENCOUNTER — Ambulatory Visit (HOSPITAL_COMMUNITY): Payer: 59 | Admitting: Speech Pathology

## 2023-06-11 ENCOUNTER — Encounter (HOSPITAL_COMMUNITY): Payer: Self-pay | Admitting: Speech Pathology

## 2023-06-11 DIAGNOSIS — R4701 Aphasia: Secondary | ICD-10-CM

## 2023-06-11 DIAGNOSIS — R41841 Cognitive communication deficit: Secondary | ICD-10-CM

## 2023-06-11 DIAGNOSIS — I69890 Apraxia following other cerebrovascular disease: Secondary | ICD-10-CM

## 2023-06-11 NOTE — Therapy (Signed)
OUTPATIENT SPEECH LANGUAGE PATHOLOGY TREATMENT   Patient Name: Robin [name] MRN: [id number] DOB:Feb 04, 1966, 57 y.o., female Today's Date: 06/11/2023  PCP: Jarold Motto, PA REFERRING PROVIDER: Jarold Motto, PA  END OF SESSION:  End of Session - 06/11/23 1009     Visit Number 16    Number of Visits 24    Date for SLP Re-Evaluation 07/03/23    Authorization Type UHC Medicare and Medicaid secondary; Dual complete   eff 08/05/22 ded 240 met oop 8850 met 2034.20 limit-no auth-no co ins-20%   SLP Start Time 0930    SLP Stop Time  1015    SLP Time Calculation (min) 45 min    Activity Tolerance Patient tolerated treatment well             Past Medical History:  Diagnosis Date   A-fib (HCC)    Abdominal aortic aneurysm (AAA) (HCC)    Allergy    Anemia    Arthritis    Atrial fibrillation (HCC)    CHF (congestive heart failure) (HCC)    a. EF at 30-35% by echo in 08/2018 b. EF at 35% by repeat echo in 05/2019 c. EF improved to 65-70% in 2021 d. EF at 40-45% in 08/2021.   Chronic abdominal pain    Chronic atrial fibrillation (HCC)    Cocaine abuse (HCC)    COPD (chronic obstructive pulmonary disease) (HCC)    Diabetes mellitus without complication (HCC)    Essential hypertension, benign    Expressive aphasia    Expressive aphasia    post CVA   Fatty liver    GERD (gastroesophageal reflux disease)    Gout 2016   Normal coronary arteries    3/10 - following abnormal Myoview   Ovarian cyst    Stroke (HCC) 12/26/2019   Right sided weakness, and expressive aphasia   Stroke [facility])    Thoracic ascending aortic aneurysm (HCC)    4 cm 10/31/19 CTA   Type 2 diabetes mellitus (HCC)    type II   Past Surgical History:  Procedure Laterality Date   ABDOMINAL HYSTERECTOMY  09/10/2011   Procedure: HYSTERECTOMY ABDOMINAL;  Surgeon: Tilda Burrow, MD;  Location: [facility];  Service: Gynecology;  Laterality: N/A;  Abdominal hysterectomy   CESAREAN SECTION  [id number], and 1994    CHOLECYSTECTOMY  1995   IR 3D INDEPENDENT WKST  03/16/2020   IR ANGIO INTRA EXTRACRAN SEL INTERNAL CAROTID BILAT MOD SED  03/16/2020   IR ANGIO VERTEBRAL SEL SUBCLAVIAN INNOMINATE UNI L MOD SED  03/16/2020   IR ANGIO VERTEBRAL SEL VERTEBRAL UNI R MOD SED  03/16/2020   IR CT HEAD LTD  12/27/2019   IR PERCUTANEOUS ART THROMBECTOMY/INFUSION INTRACRANIAL INC DIAG ANGIO  12/27/2019   IR US GUIDE VASC ACCESS RIGHT  12/27/2019   IR US GUIDE VASC ACCESS RIGHT  03/16/2020   RADIOLOGY WITH ANESTHESIA N/A 12/27/2019   Procedure: IR WITH ANESTHESIA;  Surgeon: Julieanne Cotton, MD;  Location: [facility];  Service: Radiology;  Laterality: N/A;   SCAR REVISION  09/10/2011   Procedure: SCAR REVISION;  Surgeon: Tilda Burrow, MD;  Location: [facility];  Service: Gynecology;  Laterality: N/A;  Wide Excision of old Cicatrix   TUBAL LIGATION  1994   Patient Active Problem List   Diagnosis Date Noted   Acute on chronic systolic CHF (congestive heart failure) (HCC) 11/27/2021   Stage 3b chronic kidney disease (CKD) (HCC) 11/27/2021   Microcytic anemia 11/27/2021   Lactic acidosis 11/27/2021  Diabetes mellitus (HCC) 03/09/2021   Type 2 diabetes mellitus with stage 3a chronic kidney disease, with long-term current use of insulin (HCC) 03/09/2021   Acute metabolic encephalopathy 12/31/2020   Hyperglycemia 12/31/2020   Prolonged QT interval 05/12/2020   History of CVA with residual deficit 05/12/2020   Atrial fibrillation, chronic (HCC) 05/12/2020   Right hemiparesis (HCC)    Essential hypertension    Chronic diastolic congestive heart failure (HCC)    Dysphagia due to recent stroke 01/05/2020   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 01/05/2020   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 12/27/2019   Thoracic aortic aneurysm without rupture (HCC)    Atrial fibrillation with RVR (HCC) 05/10/2019   Type 2 diabetes mellitus with diabetic autonomic neuropathy, with long-term current use of insulin  (HCC) 09/01/2018   Cocaine abuse (HCC) 08/25/2018   Cigarette nicotine dependence, uncomplicated 06/07/2015   GERD (gastroesophageal reflux disease) 06/07/2011   COPD (chronic obstructive pulmonary disease) (HCC) 06/07/2011   Arthritis 06/07/2011    ONSET DATE: May 2021   REFERRING DIAG: R47.01 (ICD-10-CM) - Expressive aphasia  THERAPY DIAG:  Aphasia  Apraxia following other cerebrovascular disease  Cognitive communication deficit  Rationale for Evaluation and Treatment: Rehabilitation  SUBJECTIVE:   SUBJECTIVE STATEMENT: "I don't know."  Pt accompanied by: self and family member  PERTINENT HISTORY: Robin [name] is a pleasant 57 y.o. female with medical history significant for atrial fibrillation on Eliquis, chronic systolic CHF, CKD 3B, insulin-dependent diabetes mellitus, polysubstance abuse, COPD, GERD, HTN, and history of CVA with residual right-sided weakness and aphasia (L MCA CVA and L ICA occlusion s/p thrombectomy on 12/27/2019 followed by CIR stay where she received ST/OT/PT until d/c home on 02/02/20 and then received some [facility] SLP services). She is referred for outpatient SLP therapy by Jarold Motto, PA to facilitate increased independence with communication.  PAIN:  Are you having pain? No  PATIENT GOALS: Increase ability to talk  OBJECTIVE:   DIAGNOSTIC FINDINGS:  MRI 11/13/2020: IMPRESSION: No evidence of acute intracranial abnormality, including acute infarction.   Redemonstrated large chronic left MCA territory infarct.   Redemonstrated small chronic cortically based infarct within the posterior right temporal lobe.   Mild bilateral ethmoid sinus mucosal thickening.  PATIENT EDUCATION: Education details: Herbalist the [employer] educated: Patient and sister, [name] Education method: Explanation and Handouts Education comprehension: verbalized understanding and needs further education   GOALS: Goals reviewed with patient?  Yes  SHORT TERM GOALS: Target date: 07/10/2023  Pt will increase verbal naming of common objects/pictures to 75% acc when provided with mod multimodality cues Baseline: 80% when allowed to write response or provided mod/max cues Goal status: MET for 100% acc for mod/max cues (phonemic cue provided); change to 100% acc with mod cues; met   2.  Pt will describe objects and pictures by providing at least three salient features (single words ok) as judged by clinician with 80% acc when provided mod cues. Baseline: 70% mod/max cues Goal status: MET for 100% with mod/max cues provided; change to 100% acc with mod cues; met   3.  Pt will complete single word sentence completion tasks [facility]) with 90% acc when provided with min/mod multimodality cues.  Baseline: 75% mod cues of initial phoneme modeled Goal status: MET for 100% acc with mi/mod cues provided; change to 100% with min cues; Partially met, d/c  4.  Pt will complete basic level reading comprehension tasks (picture cues for sentence level) with 80% acc with  provided mi/mod cues. Baseline: 100% word to picture matching f=5, 0/5 sentences Goal status: MET for single words, continue for sentences; Partially Met, use on device  5.  Pt will verbally describe action photos with use of "who" "what doing" template with 80% acc and min/mod cues.  Baseline: 70% mod cues. Goal status: MET for 100% acc with mod/max cues; change to 100% with mod cues; Met  6.  Pt will complete personally relevant responsive naming tasks with 90% acc with use of written cues as needed. Baseline: 75% acc Goal status: MET  7. Pt will use a communication device to cue themselves to speak (reading or imitating words) during 1:1 conversations with SLP with min/mod cues over 3 consecutive sessions.             Baseline: Max assist             Goal status: ONGOING   8. Pt will respond to personal preference questions by answering with alternative communication device  with 90% acc and min assist.             Baseline: Max assist             Goal status: ONGOING   9. Pt will use alternative means of communication (low tech and high tech) to maximize independence with communication and find a system that is most efficient for her.             Baseline: Trial of Lingraphica, Spoken App, letter board, etc             Goal status: ONGOING   10. Pt will use alternative means of communication to locate list of medications, health symptoms, and personal/bio information with indirect cues.             Baseline: mod assist             Goal status: ONGOING  LONG TERM GOALS: Target date: 07/25/2023  Pt will communicate moderately complex wants/needs to [facility] with min assist from communication partner and use of multimodality communication strategies. Baseline: Pt requires mod assist from communication partner  Goal status: ONGOING   ASSESSMENT:  PREVIOUS TREATMENT:     03/26/23: Pt accompanied to therapy this date by her caregiver, Robin [name] and her sister. They brought in a notebook and completed a good portion of the personalized communication template. SLP reviewed the information with Pt and Pt asked to read names of family members. Pt able to read 75% of the names and benefited from cues to look away/provide diversion and return to task when she could not read it right away. Family provided education on ways to help her with this at home. She was able to write the letters with her finger on the table 90% of the time and even orally spell when unable to say the word. Pt completed automatic speech tasks with 80% acc, but needed cues to decrease rate to increase accuracy of responses. Goals and evaluation were reviewed. Pt smiling and engaged throughout the session. She occasionally stated, "I can't do that", but with encouragement she tried to complete the request. Her sister commented that she seems to be able to communicate with some family members better than others  and SLP suggested that she ascertain the content of conversation (ie. Harder to talk about personal needs with sister than lighter conversation with an aunt). Target above goals next session.   04/03/2023: Pt accompanied by caregiver, Robin [name] and brought in her notebook. Pt completed confrontation  naming task with 50% acc without cues and increased to 90% acc with cues (Pt able to use letter board to spell the word 90% of the time). She completed single word sentence completion (SWSC) task by providing accurate verbal response when looking at pictures with 80% acc with mi/mod cues. She was then asked to repeat the word x3 and SLP interjected with confounds at times and Pt asked to return to word. This was very challenging for Pt with performance improved throughout trials. She was able to provide a gesture for each work with 100% acc. She answered responsive naming questions with use of personal communication template as needed with 95% acc. SLP provided strategies for accurate number production by having Pt count on her fingers and show the number on her hands and then verbalize the number, which appeared effective. Introduce the "Say it" set list of words next session.   04/09/2023: Pt accompanied by her caregiver, Robin [name]. Pt indicated that she went to the ED yesterday due to chest pain and nose bleed. She currently feels much better. She completed confrontation naming of high frequency words with 50% acc, single word sentence completion task with 100% acc with min cues, responsive naming with 90% and mod cues, and elicited appropriate gesture for words with 100% acc with min assist. She was cued to use her personalized communication template to help find answers to responsive naming tasks. During confrontation naming tasks, she benefited from being able to write the word, but had difficulty saying the word without phonemic and sentence cues. New words were added to her communication template (purse, keys,  phone, water, etc). Continue to target goals above and focus on responsive naming tasks for personal information.     04/14/2023: Pt accompanied by her caregiver, Robin [name]. She verbalized that she went to [place] over the weekend to see friends/family. She completed responsive naming tasks in conversation regarding personally relevant information with 90% acc with ~75% acc for verbal responses and 100% when allowed to write or use written cues. She completed confrontation naming task when looking at icons on a tablet with 70% acc with mod cues, but was able to repeat the name of the item after model provided, with 90% acc. She was introduced to the [place] tablet today and she expressed interest in using it to help her communicate. She indicates that she thinks she has something similar at home and will bring in next session. She was able to quickly point to 7 fruits that she likes to eat and was able to repeat 6/7 after model provided. Update goals next session.       04/17/23:  Pt was accompanied by a new caregiver today because Robin [name] had an appointment. Pt was able to communicate this to SLP with use of written personalized template to identify the name "Robin [name]". Robin [name] answered personally relevant responsive naming questions (regarding names of friends, family, and medical professionals) with 100% acc with use of communication support as needed and accurate verbal response 90% of the time with min cues. Pt verbalized a subject, verb/action, and object when looking at action photos with 92% acc with mi/mod cues and was then able to repeat with written model provided with 100% acc with min assist. She then completed single word sentence completion task with 100% acc with min cues. Robin [name] occasionally shuts down when a task feels out of her reach or too challenging and needs encouragement to try the task. She was trying to verbalize "smelling" and produced "smoking" instead,  but was able to break the  perseveration and produce "smelling" on ~70% of cued trials. She was given divergent naming task with word bank provided for homework and completed trial x 2 before ending the session. Next session, continue to expansive naming tasks to generate sentences when looking at photos.   04/23/2023: Pt accompanied to therapy by Robin [name]. She completed responsive naming task by answering personally relevant questions with use of written cues provided with 100% acc. She is unable to verbalize responses independently without use of written cues or initial phoneme cue from SLP. She named common objects with 100% acc when allowed to write her response or when given initial phoneme cue and she completed single word sentence completion tasks with 100% acc with provided mi/mod cues. She independently uses total communication strategies by gesturing, writing, or looking for written response when she is unable to verbalize her response. Unfortunately, she needs environmental supports via communication partner, written templates, gestures, and ability to hand write responses to communicate her thoughts. Pt would make an excellent candidate for a dynamic communication system. We have tried some apps on her phone, however she would benefit from a dedicated speech generating device. Goals have been updated and will request a trial of a communication device. Pt is in agreement with plan of care. Requesting 2x/week for 4 more weeks.   04/28/23: Pt accompanied by a different caregiver today and she was able to locate her name by finding it in her contacts on her phone. She completed responsive naming tasks with 100% acc with use of communication template, described action pictures using subject and verb with 100% acc with mod cues, completed SWSC tasks with 100% acc with min cues, confrontation naming tasks for high frequency words with 100% acc with mod/max cues. She was able to repeat 5 word sentences with 100% acc when provided mi/mod  cues from SLP. SLP submitted request for AAC device trials and Pt expressed excitement over having a more efficient way to help her communicate. Continue to target goals next session.   04/30/2023: SLP submitted request to check insurance benefits for alternative communication device, awaiting response. Pt completed HEP. She used verbal responses, letter board, communication template, and self written responses to answer personally relevant responsive naming tasks with 94% acc with mi/mod use of non-verbal responses. She completed confrontation naming of high frequency pictures with 100% acc with mod cues. Pt expressed that mentally fatiguing for her to verbalize responses at times. SLP provided reassurance and positive reinforcement toward Pt gains this session. She continues to be motivated and uses alternative communication strategies (even able to spell words aloud when unable to verbalize the word) with indirect cues. Continue targeting goals and trials of AAC devices.   05/21/2023: Pt unable to attend therapy in the last few weeks due to scheduling conflicts with her ability to obtain transportation. She was accompanied to today's visit by her sister and her new caregiver. SLP introduced the [place] communication device today. Pt able to turn the device on and select icons with cues from SLP (orientation factors and the specific folders). SLP opened the page to breakfast items and Pt selected 10 items she enjoys eating and was encouraged to use the device to cue herself to speak if unable to verbalize the word. She assisted SLP in adding personally relevant information to her communication template that she would like to have on the device. Her schedule has been updated and she will be able to attend more consistently and expressed  feeling positive about using a speech generating device to assist with communication. Device will need further personalization before we begin a trial.   05/26/2023: Pt  accompanied to therapy by her new caregiver. She communicated to me that her son was shot and is in the hospital. SLP showed Pt how to add icons and folders to the [place] device. Pt requested that we add "[facility]" and was able to then return demonstrate navigation of the device to select the icon and use it to prompt verbal production from her. She also identified two Gospel singers she likes and we added those icons. She was encouraged to verbalize her responses first when answering Wh-questions and then use the device to prompt speech. She verbalized that her son was "58" years old, however when she was prompted to write the age on the dry erase board feature, she wrote his correct age. Robin [name] can often write the word she wants to say when she is unable to verbalize it. She was encouraged to use the camera feature at home to take photos to add to the [employer]. Continue with device navigation and personalization next session.   05/28/2023: Pt used the Lingraphica device at home by completing verbal expression activities in therapy section.  SLP facilitated session by adding additional icons and folders for Pt to express herself in session and with her caregivers at home. She was able to select food choices and restaurants with min assist once opened to the page. SLP cued the Pt to use the device to cue herself to speak (model from device and then attempt to repeat). She expressed that she would like to be able to vote, but didn't think that she was able to. SLP encouraged Pt to have someone from her family to take her to vote and she can request assist as needed. Pt smiled throughout the session and used multi-modality systems to communicate with SLP (gestures, verbal response, AAC device, written responses, and occasionally letter board). Pt continues to be motivated and shows excellent ability to use an AAC. Continue with trials and goals have been updated, cert request.  06/02/2023:  Pt arrived on [facility] without her caregiver this AM and she appeared pleased that she was able to accomplish this on her own. A new caregiver, Robin [name], arrived later on in our treatment session. SLP encouraged Pt to introduce herself to the new caregiver and use the device as needed to supplement. Pt required mod cues via offering binary choices (did you have a stroke or a heart attack?) to communicate to caregiver. Robin [name] added photos to the [place] device and SLP facilitated adding these to her "people" page and needed to rotate pictures etc. SLP created a flow sheet for Pt to follow to learn how to add icons and move to different folders. Pt was able to return demonstrate with moderate cueing. She benefits from repetition of task and was encouraged to practice this at home, while fresh on her mind. She was able to answer responsive naming questions with use of device and verbal response with ~85% acc with min cues for device navigation. Next session, SLP will ask Pt to locate recently added icons to answer responsive naming questions and add a page for medical information.           06/05/2023: [facility] did not pick Pt up for her appointment this AM, but we were able to reschedule her for a later time today. She was cued to refrain from pointing/gesturing when she  wanted an action (pick up her bag, bring her phone, order food) and to verbalize first. She was able to produce single words to convey meaning. Session focused on navigation of Lingraphica device and adding new icons to report symptoms. Robin [name] was able to turn on the device, locate the "people" tab and relay who her medical team and caregivers included. She identified three pain descriptors she wanted added: left shoulder pain, bilateral knee pain, and left toe pain. SLP also added stroke related information. We had difficulty copying and moving icons to other folders, so SLP will reach out to Lingraphica to facilitate this. Pt is more efficiently able to  use the Lingraphica device over the letter board and written template, however SLP continues to encourage Pt to use all methods of communication at this time. Continue plan of care and address goals as stated above.   06/09/2023: Robin [name] was accompanied by her caregiver, Robin [name]. She was able to turn on the Lingraphica device and navigate to specific folders with indirect cues. She added three phots of items in her home: blender, TV, and her bed. We created an icon for blender and placed in the "kitchen" folder. She was able to then locate the icon, but it did require about 5 steps to arrive to location. She answered personally relevant responsive naming questions with use of device. When SLP asked if she voted, she said yes and that her "Robin [name]" took her, however SLP then cued her to use the device to locate the icon of who took her. She then accurately identified her sister. Once SLP assisted with navigation to the restaurants page, she was able to select the placed she goes to and got rid of the ones she does not. Under the [employer] icon, we changed it to a folder and she added: fish sandwich, fries, and sweet tea. She navigated to the medical information page and showed current body aches. Continue to have Pt navigate the device and communicate more complex ideas.  CURRENT TREATMENT: Robin [name] was accompanied by her caregiver, Robin [name]. Pt and caregiver added some restaurants into the [place] device, however it was discovered that there are two "restaurant" folders and they are different. Together, we merged the two folders into one. She was able to navigate to specific folders with min cues. She was shown how to follow the listed word sheet at the top to help orient her within the device. We had some trouble copying and moving folders so I emailed Lingraphica to assist with this. She was reminded to not only select the icon, but to try to use it to prompt her to say what the icon represents. She is able to do this with  single word responses better than a full sentence. She is becoming more adept with device navigation and was encouraged to add some more photos over the weekend. Continue with device personalization and navigation.                                                                                                       DATE: 06/11/23  CLINICAL  IMPRESSION: (from initial evaluation on 03/19/2023) Patient is a 57 y.o. female who was seen today for speech/language evaluation s/p CVA in May of 2021 with only limited follow up SLP therapy via home health in 2021 when she was staying with her daughter. [name] sister accompanied her to the evaluation and would like for her sister to be able to communicate her wants and needs verbally. 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 Pt has difficulty verbally labeling high frequency objects (but was able to write some responses), repeating longer words/phrases, and reading phrases. She was able to match 5/5 high frequency objects to single words, but was only able to orally read/label 1/5 words. Pt benefited from gesturing to communicate her intent and this occasionally helped elicit word production and she also benefited from SLP phonemic cues and single word sentence completion cues. Recommend SLP therapy to address communication deficits and increase independence with communication 2x/week for 4-8 weeks.  OBJECTIVE IMPAIRMENTS: include executive functioning, expressive language, receptive language, aphasia, and apraxia. These impairments are limiting patient from managing medications, managing appointments, managing finances, and effectively  communicating at home and in community. Factors affecting potential to achieve goals and functional outcome are ability to learn/carryover information, severity of impairments, and time post onset of aphasia (2021) . Patient will benefit from skilled SLP services to address above impairments and improve overall function.  REHAB POTENTIAL: Good  PLAN:  SLP FREQUENCY: 2x/week  SLP DURATION: 8 weeks  PLANNED INTERVENTIONS: Cueing hierachy, Internal/external aids, Functional tasks, Multimodal communication approach, SLP instruction and feedback, Compensatory strategies, Patient/family education, and Re-evaluation    Thank you,  Havery Moros, CCC-SLP [phone]  Shunna Mikaelian, CCC-SLP 06/11/2023, 10:22 AM

## 2023-06-16 ENCOUNTER — Encounter (HOSPITAL_COMMUNITY): Payer: Self-pay | Admitting: Speech Pathology

## 2023-06-16 ENCOUNTER — Ambulatory Visit (HOSPITAL_COMMUNITY): Payer: 59 | Admitting: Speech Pathology

## 2023-06-16 DIAGNOSIS — R4701 Aphasia: Secondary | ICD-10-CM

## 2023-06-16 DIAGNOSIS — I69890 Apraxia following other cerebrovascular disease: Secondary | ICD-10-CM

## 2023-06-16 NOTE — Therapy (Signed)
OUTPATIENT SPEECH LANGUAGE PATHOLOGY TREATMENT   Patient Name: Robin [name] MRN: [id number] DOB:1965/09/14, 57 y.o., female Today's Date: 06/16/2023  PCP: Robin Motto, PA REFERRING PROVIDER: Jarold Motto, PA  END OF SESSION:  End of Session - 06/16/23 0952     Visit Number 17    Number of Visits 24    Date for SLP Re-Evaluation 07/03/23    Authorization Type U[employer] Medicare and Medicaid secondary; Dual complete   eff 08/05/22 ded 240 met oop 8850 met 2034.20 limit-no auth-no co ins-20%   SLP Start Time 0930    SLP Stop Time  1015    SLP Time Calculation (min) 45 min    Activity Tolerance Patient tolerated treatment well             Past Medical History:  Diagnosis Date   A-fib (HCC)    Abdominal aortic aneurysm (AAA) (HCC)    Allergy    Anemia    Arthritis    Atrial fibrillation (HCC)    CHF (congestive heart failure) (HCC)    a. EF at 30-35% by echo in 08/2018 b. EF at 35% by repeat echo in 05/2019 c. EF improved to 65-70% in 2021 d. EF at 40-45% in 08/2021.   Chronic abdominal pain    Chronic atrial fibrillation (HCC)    Cocaine abuse (HCC)    COPD (chronic obstructive pulmonary disease) (HCC)    Diabetes mellitus without complication (HCC)    Essential hypertension, benign    Expressive aphasia    Expressive aphasia    post CVA   Fatty liver    GERD (gastroesophageal reflux disease)    Gout 2016   Normal coronary arteries    3/10 - following abnormal Myoview   Ovarian cyst    Stroke (HCC) 12/26/2019   Right sided weakness, and expressive aphasia   Stroke [facility])    Thoracic ascending aortic aneurysm (HCC)    4 cm 10/31/19 CTA   Type 2 diabetes mellitus (HCC)    type II   Past Surgical History:  Procedure Laterality Date   ABDOMINAL HYSTERECTOMY  09/10/2011   Procedure: HYSTERECTOMY ABDOMINAL;  Surgeon: Robin Burrow, MD;  Location: [facility];  Service: Gynecology;  Laterality: N/A;  Abdominal hysterectomy   CESAREAN SECTION  [phone], and 1994    CHOLECYSTECTOMY  1995   IR 3D INDEPENDENT WKST  03/16/2020   IR ANGIO INTRA EXTRACRAN SEL INTERNAL CAROTID BILAT MOD SED  03/16/2020   IR ANGIO VERTEBRAL SEL SUBCLAVIAN INNOMINATE UNI L MOD SED  03/16/2020   IR ANGIO VERTEBRAL SEL VERTEBRAL UNI R MOD SED  03/16/2020   IR CT HEAD LTD  12/27/2019   IR PERCUTANEOUS ART THROMBECTOMY/INFUSION INTRACRANIAL INC DIAG ANGIO  12/27/2019   IR US GUIDE VASC ACCESS RIGHT  12/27/2019   IR US GUIDE VASC ACCESS RIGHT  03/16/2020   RADIOLOGY WITH ANESTHESIA N/A 12/27/2019   Procedure: IR WITH ANESTHESIA;  Surgeon: Robin Cotton, MD;  Location: [facility];  Service: Radiology;  Laterality: N/A;   SCAR REVISION  09/10/2011   Procedure: SCAR REVISION;  Surgeon: Robin Burrow, MD;  Location: [facility];  Service: Gynecology;  Laterality: N/A;  Wide Excision of old Cicatrix   TUBAL LIGATION  1994   Patient Active Problem List   Diagnosis Date Noted   Acute on chronic systolic CHF (congestive heart failure) (HCC) 11/27/2021   Stage 3b chronic kidney disease (CKD) (HCC) 11/27/2021   Microcytic anemia 11/27/2021   Lactic acidosis 11/27/2021  Diabetes mellitus (HCC) 03/09/2021   Type 2 diabetes mellitus with stage 3a chronic kidney disease, with long-term current use of insulin (HCC) 03/09/2021   Acute metabolic encephalopathy 12/31/2020   Hyperglycemia 12/31/2020   Prolonged QT interval 05/12/2020   History of CVA with residual deficit 05/12/2020   Atrial fibrillation, chronic (HCC) 05/12/2020   Right hemiparesis (HCC)    Essential hypertension    Chronic diastolic congestive heart failure (HCC)    Dysphagia due to recent stroke 01/05/2020   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 01/05/2020   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 12/27/2019   Thoracic aortic aneurysm without rupture (HCC)    Atrial fibrillation with RVR (HCC) 05/10/2019   Type 2 diabetes mellitus with diabetic autonomic neuropathy, with long-term current use of insulin  (HCC) 09/01/2018   Cocaine abuse (HCC) 08/25/2018   Cigarette nicotine dependence, uncomplicated 06/07/2015   GERD (gastroesophageal reflux disease) 06/07/2011   COPD (chronic obstructive pulmonary disease) (HCC) 06/07/2011   Arthritis 06/07/2011    ONSET DATE: May 2021   REFERRING DIAG: R47.01 (ICD-10-CM) - Expressive aphasia  THERAPY DIAG:  Aphasia  Apraxia following other cerebrovascular disease  Rationale for Evaluation and Treatment: Rehabilitation  SUBJECTIVE:   SUBJECTIVE STATEMENT: "I'm going home. I'm going sleep."  Pt accompanied by: self and family member  PERTINENT HISTORY: Robin [name] is a pleasant 58 y.o. female with medical history significant for atrial fibrillation on Eliquis, chronic systolic CHF, CKD 3B, insulin-dependent diabetes mellitus, polysubstance abuse, COPD, GERD, HTN, and history of CVA with residual right-sided weakness and aphasia (L MCA CVA and L ICA occlusion s/p thrombectomy on 12/27/2019 followed by CIR stay where she received ST/OT/PT until d/c home on 02/02/20 and then received some [facility] SLP services). She is referred for outpatient SLP therapy by Robin Motto, PA to facilitate increased independence with communication.  PAIN:  Are you having pain? No  PATIENT GOALS: Increase ability to talk  OBJECTIVE:   DIAGNOSTIC FINDINGS:  MRI 11/13/2020: IMPRESSION: No evidence of acute intracranial abnormality, including acute infarction.   Redemonstrated large chronic left MCA territory infarct.   Redemonstrated small chronic cortically based infarct within the posterior right temporal lobe.   Mild bilateral ethmoid sinus mucosal thickening.  PATIENT EDUCATION: Education details: Herbalist the [employer] [place] educated: Patient and sister, Robin [name] Education method: Explanation and Handouts Education comprehension: verbalized understanding and needs further education   GOALS: Goals reviewed with patient? Yes  SHORT TERM  GOALS: Target date: 07/10/2023  Pt will increase verbal naming of common objects/pictures to 75% acc when provided with mod multimodality cues Baseline: 80% when allowed to write response or provided mod/max cues Goal status: MET for 100% acc for mod/max cues (phonemic cue provided); change to 100% acc with mod cues; met   2.  Pt will describe objects and pictures by providing at least three salient features (single words ok) as judged by clinician with 80% acc when provided mod cues. Baseline: 70% mod/max cues Goal status: MET for 100% with mod/max cues provided; change to 100% acc with mod cues; met   3.  Pt will complete single word sentence completion tasks [facility]) with 90% acc when provided with min/mod multimodality cues.  Baseline: 75% mod cues of initial phoneme modeled Goal status: MET for 100% acc with mi/mod cues provided; change to 100% with min cues; Partially met, d/c  4.  Pt will complete basic level reading comprehension tasks (picture cues for sentence level) with 80% acc with provided  mi/mod cues. Baseline: 100% word to picture matching f=5, 0/5 sentences Goal status: MET for single words, continue for sentences; Partially Met, use on device  5.  Pt will verbally describe action photos with use of "who" "what doing" template with 80% acc and min/mod cues.  Baseline: 70% mod cues. Goal status: MET for 100% acc with mod/max cues; change to 100% with mod cues; Met  6.  Pt will complete personally relevant responsive naming tasks with 90% acc with use of written cues as needed. Baseline: 75% acc Goal status: MET  7. Pt will use a communication device to cue themselves to speak (reading or imitating words) during 1:1 conversations with SLP with min/mod cues over 3 consecutive sessions.             Baseline: Max assist             Goal status: ONGOING   8. Pt will respond to personal preference questions by answering with alternative communication device with 90% acc and min  assist.             Baseline: Max assist             Goal status: ONGOING   9. Pt will use alternative means of communication (low tech and high tech) to maximize independence with communication and find a system that is most efficient for her.             Baseline: Trial of Lingraphica, Spoken App, letter board, etc             Goal status: ONGOING   10. Pt will use alternative means of communication to locate list of medications, health symptoms, and personal/bio information with indirect cues.             Baseline: mod assist             Goal status: ONGOING  LONG TERM GOALS: Target date: 07/25/2023  Pt will communicate moderately complex wants/needs to [facility] with min assist from communication partner and use of multimodality communication strategies. Baseline: Pt requires mod assist from communication partner  Goal status: ONGOING   ASSESSMENT:  PREVIOUS TREATMENT:     03/26/23: Pt accompanied to therapy this date by her caregiver, Agustin Cree and her sister. They brought in a notebook and completed a good portion of the personalized communication template. SLP reviewed the information with Pt and Pt asked to read names of family members. Pt able to read 75% of the names and benefited from cues to look away/provide diversion and return to task when she could not read it right away. Family provided education on ways to help her with this at home. She was able to write the letters with her finger on the table 90% of the time and even orally spell when unable to say the word. Pt completed automatic speech tasks with 80% acc, but needed cues to decrease rate to increase accuracy of responses. Goals and evaluation were reviewed. Pt smiling and engaged throughout the session. She occasionally stated, "I can't do that", but with encouragement she tried to complete the request. Her sister commented that she seems to be able to communicate with some family members better than others and SLP suggested that  she ascertain the content of conversation (ie. Harder to talk about personal needs with sister than lighter conversation with an aunt). Target above goals next session.   04/03/2023: Pt accompanied by caregiver, Agustin Cree and brought in her notebook. Pt completed confrontation naming  task with 50% acc without cues and increased to 90% acc with cues (Pt able to use letter board to spell the word 90% of the time). She completed single word sentence completion (SWSC) task by providing accurate verbal response when looking at pictures with 80% acc with mi/mod cues. She was then asked to repeat the word x3 and SLP interjected with confounds at times and Pt asked to return to word. This was very challenging for Pt with performance improved throughout trials. She was able to provide a gesture for each work with 100% acc. She answered responsive naming questions with use of personal communication template as needed with 95% acc. SLP provided strategies for accurate number production by having Pt count on her fingers and show the number on her hands and then verbalize the number, which appeared effective. Introduce the "Say it" set list of words next session.   04/09/2023: Pt accompanied by her caregiver, Darlene. Pt indicated that she went to the ED yesterday due to chest pain and nose bleed. She currently feels much better. She completed confrontation naming of high frequency words with 50% acc, single word sentence completion task with 100% acc with min cues, responsive naming with 90% and mod cues, and elicited appropriate gesture for words with 100% acc with min assist. She was cued to use her personalized communication template to help find answers to responsive naming tasks. During confrontation naming tasks, she benefited from being able to write the word, but had difficulty saying the word without phonemic and sentence cues. New words were added to her communication template (purse, keys, phone, water, etc). Continue  to target goals above and focus on responsive naming tasks for personal information.     04/14/2023: Pt accompanied by her caregiver, Darlene. She verbalized that she went to [place] over the weekend to see friends/family. She completed responsive naming tasks in conversation regarding personally relevant information with 90% acc with ~75% acc for verbal responses and 100% when allowed to write or use written cues. She completed confrontation naming task when looking at icons on a tablet with 70% acc with mod cues, but was able to repeat the name of the item after model provided, with 90% acc. She was introduced to the [place] tablet today and she expressed interest in using it to help her communicate. She indicates that she thinks she has something similar at home and will bring in next session. She was able to quickly point to 7 fruits that she likes to eat and was able to repeat 6/7 after model provided. Update goals next session.       04/17/23:  Pt was accompanied by a new caregiver today because Darlene had an appointment. Pt was able to communicate this to SLP with use of written personalized template to identify the name "Darlene". Harriett Sine answered personally relevant responsive naming questions (regarding names of friends, family, and medical professionals) with 100% acc with use of communication support as needed and accurate verbal response 90% of the time with min cues. Pt verbalized a subject, verb/action, and object when looking at action photos with 92% acc with mi/mod cues and was then able to repeat with written model provided with 100% acc with min assist. She then completed single word sentence completion task with 100% acc with min cues. Briana occasionally shuts down when a task feels out of her reach or too challenging and needs encouragement to try the task. She was trying to verbalize "smelling" and produced "smoking" instead, but  was able to break the perseveration and produce  "smelling" on ~70% of cued trials. She was given divergent naming task with word bank provided for homework and completed trial x 2 before ending the session. Next session, continue to expansive naming tasks to generate sentences when looking at photos.   04/23/2023: Pt accompanied to therapy by Darlene. She completed responsive naming task by answering personally relevant questions with use of written cues provided with 100% acc. She is unable to verbalize responses independently without use of written cues or initial phoneme cue from SLP. She named common objects with 100% acc when allowed to write her response or when given initial phoneme cue and she completed single word sentence completion tasks with 100% acc with provided mi/mod cues. She independently uses total communication strategies by gesturing, writing, or looking for written response when she is unable to verbalize her response. Unfortunately, she needs environmental supports via communication partner, written templates, gestures, and ability to hand write responses to communicate her thoughts. Pt would make an excellent candidate for a dynamic communication system. We have tried some apps on her phone, however she would benefit from a dedicated speech generating device. Goals have been updated and will request a trial of a communication device. Pt is in agreement with plan of care. Requesting 2x/week for 4 more weeks.   04/28/23: Pt accompanied by a different caregiver today and she was able to locate her name by finding it in her contacts on her phone. She completed responsive naming tasks with 100% acc with use of communication template, described action pictures using subject and verb with 100% acc with mod cues, completed SWSC tasks with 100% acc with min cues, confrontation naming tasks for high frequency words with 100% acc with mod/max cues. She was able to repeat 5 word sentences with 100% acc when provided mi/mod cues from SLP. SLP  submitted request for AAC device trials and Pt expressed excitement over having a more efficient way to help her communicate. Continue to target goals next session.   04/30/2023: SLP submitted request to check insurance benefits for alternative communication device, awaiting response. Pt completed HEP. She used verbal responses, letter board, communication template, and self written responses to answer personally relevant responsive naming tasks with 94% acc with mi/mod use of non-verbal responses. She completed confrontation naming of high frequency pictures with 100% acc with mod cues. Pt expressed that mentally fatiguing for her to verbalize responses at times. SLP provided reassurance and positive reinforcement toward Pt gains this session. She continues to be motivated and uses alternative communication strategies (even able to spell words aloud when unable to verbalize the word) with indirect cues. Continue targeting goals and trials of AAC devices.   05/21/2023: Pt unable to attend therapy in the last few weeks due to scheduling conflicts with her ability to obtain transportation. She was accompanied to today's visit by her sister and her new caregiver. SLP introduced the [place] communication device today. Pt able to turn the device on and select icons with cues from SLP (orientation factors and the specific folders). SLP opened the page to breakfast items and Pt selected 10 items she enjoys eating and was encouraged to use the device to cue herself to speak if unable to verbalize the word. She assisted SLP in adding personally relevant information to her communication template that she would like to have on the device. Her schedule has been updated and she will be able to attend more consistently and expressed feeling  positive about using a speech generating device to assist with communication. Device will need further personalization before we begin a trial.   05/26/2023: Pt accompanied to therapy  by her new caregiver. She communicated to me that her son was shot and is in the hospital. SLP showed Pt how to add icons and folders to the [place] device. Pt requested that we add "[facility]" and was able to then return demonstrate navigation of the device to select the icon and use it to prompt verbal production from her. She also identified two Gospel singers she likes and we added those icons. She was encouraged to verbalize her responses first when answering Wh-questions and then use the device to prompt speech. She verbalized that her son was "29" years old, however when she was prompted to write the age on the dry erase board feature, she wrote his correct age. Revae can often write the word she wants to say when she is unable to verbalize it. She was encouraged to use the camera feature at home to take photos to add to the [employer]. Continue with device navigation and personalization next session.   05/28/2023: Pt used the Lingraphica device at home by completing verbal expression activities in therapy section.  SLP facilitated session by adding additional icons and folders for Pt to express herself in session and with her caregivers at home. She was able to select food choices and restaurants with min assist once opened to the page. SLP cued the Pt to use the device to cue herself to speak (model from device and then attempt to repeat). She expressed that she would like to be able to vote, but didn't think that she was able to. SLP encouraged Pt to have someone from her family to take her to vote and she can request assist as needed. Pt smiled throughout the session and used multi-modality systems to communicate with SLP (gestures, verbal response, AAC device, written responses, and occasionally letter board). Pt continues to be motivated and shows excellent ability to use an AAC. Continue with trials and goals have been updated, cert request.  06/02/2023: Pt arrived on [facility]  without her caregiver this AM and she appeared pleased that she was able to accomplish this on her own. A new caregiver, Raechel Ache, arrived later on in our treatment session. SLP encouraged Pt to introduce herself to the new caregiver and use the device as needed to supplement. Pt required mod cues via offering binary choices (did you have a stroke or a heart attack?) to communicate to caregiver. Harriett Sine added photos to the [place] device and SLP facilitated adding these to her "people" page and needed to rotate pictures etc. SLP created a flow sheet for Pt to follow to learn how to add icons and move to different folders. Pt was able to return demonstrate with moderate cueing. She benefits from repetition of task and was encouraged to practice this at home, while fresh on her mind. She was able to answer responsive naming questions with use of device and verbal response with ~85% acc with min cues for device navigation. Next session, SLP will ask Pt to locate recently added icons to answer responsive naming questions and add a page for medical information.           06/05/2023: [facility] did not pick Pt up for her appointment this AM, but we were able to reschedule her for a later time today. She was cued to refrain from pointing/gesturing when she wanted  an action (pick up her bag, bring her phone, order food) and to verbalize first. She was able to produce single words to convey meaning. Session focused on navigation of Lingraphica device and adding new icons to report symptoms. Bernadett was able to turn on the device, locate the "people" tab and relay who her medical team and caregivers included. She identified three pain descriptors she wanted added: left shoulder pain, bilateral knee pain, and left toe pain. SLP also added stroke related information. We had difficulty copying and moving icons to other folders, so SLP will reach out to Lingraphica to facilitate this. Pt is more efficiently able to use the Lingraphica  device over the letter board and written template, however SLP continues to encourage Pt to use all methods of communication at this time. Continue plan of care and address goals as stated above.   06/09/2023: Neshell was accompanied by her caregiver, Mae. She was able to turn on the Lingraphica device and navigate to specific folders with indirect cues. She added three phots of items in her home: blender, TV, and her bed. We created an icon for blender and placed in the "kitchen" folder. She was able to then locate the icon, but it did require about 5 steps to arrive to location. She answered personally relevant responsive naming questions with use of device. When SLP asked if she voted, she said yes and that her "Cathren Harsh" took her, however SLP then cued her to use the device to locate the icon of who took her. She then accurately identified her sister. Once SLP assisted with navigation to the restaurants page, she was able to select the placed she goes to and got rid of the ones she does not. Under the [employer] icon, we changed it to a folder and she added: fish sandwich, fries, and sweet tea. She navigated to the medical information page and showed current body aches. Continue to have Pt navigate the device and communicate more complex ideas.  06/11/2023: Kylia was accompanied by her caregiver, Mae. Pt and caregiver added some restaurants into the [place] device, however it was discovered that there are two "restaurant" folders and they are different. Together, we merged the two folders into one. She was able to navigate to specific folders with min cues. She was shown how to follow the listed word sheet at the top to help orient her within the device. We had some trouble copying and moving folders so I emailed Lingraphica to assist with this. She was reminded to not only select the icon, but to try to use it to prompt her to say what the icon represents. She is able to do this with single word responses  better than a full sentence. She is becoming more adept with device navigation and was encouraged to add some more photos over the weekend. Continue with device personalization and navigation.   CURRENT TREATMENT: Pt added a new caregiver with name and photo over the weekend and placed it in the correct location. She was able to navigate the device and show me what she added. Pt cued to use the device to answer "wh" questions related to favorites, family, and current events and she answered with 95% acc with min assist for more efficient navigation. She often had to look in a few folders to find the information she wanted. We added some phrases and sentences to the "Fast Talk" folder and she was then able to use each phrase appropriately with min prompt (find the icon  to request that transportation service be called to pick you up". We also navigated to the "Therapy" folder and she completed spelling of pictured objects when given the three letters to place in order (95%). She was unable to imitate/repeat the phrases from the video recorded section, so we can try this again next time. Pt pointed to a list in her notebook of medications and phone numbers that she indicated she wanted to place in the [place]. SLP will pursue completion of evaluation form, as Pt has verbally expressed that she would like to obtain a speaking device for herself.                                                                                                       DATE: 06/16/23  CLINICAL IMPRESSION: (from initial evaluation on 03/19/2023) Patient is a 57 y.o. female who was seen today for speech/language evaluation s/p CVA in May of 2021 with only limited follow up SLP therapy via home health in 2021 when she was staying with her daughter. [name] sister accompanied her to the evaluation and would like for her sister to be able to communicate her wants and needs verbally. 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 Pt has difficulty verbally labeling high frequency objects (but was able to write some responses), repeating longer words/phrases, and reading phrases. She was able to match 5/5 high frequency objects to single words, but was only able to orally read/label 1/5 words. Pt benefited from gesturing to communicate her intent and this occasionally helped elicit word production and she also benefited from SLP phonemic cues and single word sentence completion cues. Recommend SLP therapy to address communication deficits and increase independence with communication 2x/week for 4-8 weeks.  OBJECTIVE IMPAIRMENTS: include executive functioning, expressive language, receptive language, aphasia, and apraxia. These impairments are limiting patient from managing medications, managing appointments, managing finances, and effectively communicating at home and in community. Factors affecting potential to achieve goals and functional outcome are ability to learn/carryover information, severity of impairments, and time post onset of aphasia (2021) . Patient will benefit from skilled SLP services to address above impairments and improve overall function.  REHAB POTENTIAL: Good  PLAN:  SLP FREQUENCY: 2x/week  SLP DURATION: 8 weeks  PLANNED INTERVENTIONS: Cueing hierachy, Internal/external aids, Functional tasks, Multimodal communication approach, SLP instruction and feedback, Compensatory strategies, Patient/family education, and Re-evaluation    Thank you,  Havery Moros, CCC-SLP [phone]  Idy Rawling, CCC-SLP 06/16/2023, 4:08 PM

## 2023-06-18 ENCOUNTER — Encounter (HOSPITAL_COMMUNITY): Payer: Self-pay | Admitting: Speech Pathology

## 2023-06-18 ENCOUNTER — Ambulatory Visit (HOSPITAL_COMMUNITY): Payer: 59 | Admitting: Speech Pathology

## 2023-06-18 DIAGNOSIS — R4701 Aphasia: Secondary | ICD-10-CM

## 2023-06-18 DIAGNOSIS — R41841 Cognitive communication deficit: Secondary | ICD-10-CM

## 2023-06-18 DIAGNOSIS — I69890 Apraxia following other cerebrovascular disease: Secondary | ICD-10-CM

## 2023-06-18 NOTE — Therapy (Signed)
OUTPATIENT SPEECH LANGUAGE PATHOLOGY TREATMENT   Patient Name: Robin [name] MRN: [id number] DOB:1965/11/14, 57 y.o., female Today's Date: 06/18/2023  PCP: Jarold Motto, PA REFERRING PROVIDER: Jarold Motto, PA  END OF SESSION:  End of Session - 06/18/23 0953     Visit Number 18    Number of Visits 24    Date for SLP Re-Evaluation 07/03/23    Authorization Type U[employer] Medicare and Medicaid secondary; Dual complete   eff 08/05/22 ded 240 met oop 8850 met 2034.20 limit-no auth-no co ins-20%   SLP Start Time 0930    SLP Stop Time  1015    SLP Time Calculation (min) 45 min    Activity Tolerance Patient tolerated treatment well             Past Medical History:  Diagnosis Date   A-fib (HCC)    Abdominal aortic aneurysm (AAA) (HCC)    Allergy    Anemia    Arthritis    Atrial fibrillation (HCC)    CHF (congestive heart failure) (HCC)    a. EF at 30-35% by echo in 08/2018 b. EF at 35% by repeat echo in 05/2019 c. EF improved to 65-70% in 2021 d. EF at 40-45% in 08/2021.   Chronic abdominal pain    Chronic atrial fibrillation (HCC)    Cocaine abuse (HCC)    COPD (chronic obstructive pulmonary disease) (HCC)    Diabetes mellitus without complication (HCC)    Essential hypertension, benign    Expressive aphasia    Expressive aphasia    post CVA   Fatty liver    GERD (gastroesophageal reflux disease)    Gout 2016   Normal coronary arteries    3/10 - following abnormal Myoview   Ovarian cyst    Stroke (HCC) 12/26/2019   Right sided weakness, and expressive aphasia   Stroke [facility])    Thoracic ascending aortic aneurysm (HCC)    4 cm 10/31/19 CTA   Type 2 diabetes mellitus (HCC)    type II   Past Surgical History:  Procedure Laterality Date   ABDOMINAL HYSTERECTOMY  09/10/2011   Procedure: HYSTERECTOMY ABDOMINAL;  Surgeon: Tilda Burrow, MD;  Location: [facility];  Service: Gynecology;  Laterality: N/A;  Abdominal hysterectomy   CESAREAN SECTION  [phone], and 1994    CHOLECYSTECTOMY  1995   IR 3D INDEPENDENT WKST  03/16/2020   IR ANGIO INTRA EXTRACRAN SEL INTERNAL CAROTID BILAT MOD SED  03/16/2020   IR ANGIO VERTEBRAL SEL SUBCLAVIAN INNOMINATE UNI L MOD SED  03/16/2020   IR ANGIO VERTEBRAL SEL VERTEBRAL UNI R MOD SED  03/16/2020   IR CT HEAD LTD  12/27/2019   IR PERCUTANEOUS ART THROMBECTOMY/INFUSION INTRACRANIAL INC DIAG ANGIO  12/27/2019   IR US GUIDE VASC ACCESS RIGHT  12/27/2019   IR US GUIDE VASC ACCESS RIGHT  03/16/2020   RADIOLOGY WITH ANESTHESIA N/A 12/27/2019   Procedure: IR WITH ANESTHESIA;  Surgeon: Julieanne Cotton, MD;  Location: [facility];  Service: Radiology;  Laterality: N/A;   SCAR REVISION  09/10/2011   Procedure: SCAR REVISION;  Surgeon: Tilda Burrow, MD;  Location: [facility];  Service: Gynecology;  Laterality: N/A;  Wide Excision of old Cicatrix   TUBAL LIGATION  1994   Patient Active Problem List   Diagnosis Date Noted   Acute on chronic systolic CHF (congestive heart failure) (HCC) 11/27/2021   Stage 3b chronic kidney disease (CKD) (HCC) 11/27/2021   Microcytic anemia 11/27/2021   Lactic acidosis 11/27/2021  Diabetes mellitus (HCC) 03/09/2021   Type 2 diabetes mellitus with stage 3a chronic kidney disease, with long-term current use of insulin (HCC) 03/09/2021   Acute metabolic encephalopathy 12/31/2020   Hyperglycemia 12/31/2020   Prolonged QT interval 05/12/2020   History of CVA with residual deficit 05/12/2020   Atrial fibrillation, chronic (HCC) 05/12/2020   Right hemiparesis (HCC)    Essential hypertension    Chronic diastolic congestive heart failure (HCC)    Dysphagia due to recent stroke 01/05/2020   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 01/05/2020   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/p clot retrieval 12/27/2019   Thoracic aortic aneurysm without rupture (HCC)    Atrial fibrillation with RVR (HCC) 05/10/2019   Type 2 diabetes mellitus with diabetic autonomic neuropathy, with long-term current use of insulin  (HCC) 09/01/2018   Cocaine abuse (HCC) 08/25/2018   Cigarette nicotine dependence, uncomplicated 06/07/2015   GERD (gastroesophageal reflux disease) 06/07/2011   COPD (chronic obstructive pulmonary disease) (HCC) 06/07/2011   Arthritis 06/07/2011    ONSET DATE: May 2021   REFERRING DIAG: R47.01 (ICD-10-CM) - Expressive aphasia  THERAPY DIAG:  Aphasia  Apraxia following other cerebrovascular disease  Cognitive communication deficit  Rationale for Evaluation and Treatment: Rehabilitation  SUBJECTIVE:   SUBJECTIVE STATEMENT: "I need to add that."  Pt accompanied by: self and family member  PERTINENT HISTORY: Robin [name] is a pleasant 57 y.o. female with medical history significant for atrial fibrillation on Eliquis, chronic systolic CHF, CKD 3B, insulin-dependent diabetes mellitus, polysubstance abuse, COPD, GERD, HTN, and history of CVA with residual right-sided weakness and aphasia (L MCA CVA and L ICA occlusion s/p thrombectomy on 12/27/2019 followed by CIR stay where she received ST/OT/PT until d/c home on 02/02/20 and then received some [facility] SLP services). She is referred for outpatient SLP therapy by Jarold Motto, PA to facilitate increased independence with communication.  PAIN:  Are you having pain? No  PATIENT GOALS: Increase ability to talk  OBJECTIVE:   DIAGNOSTIC FINDINGS:  MRI 11/13/2020: IMPRESSION: No evidence of acute intracranial abnormality, including acute infarction.   Redemonstrated large chronic left MCA territory infarct.   Redemonstrated small chronic cortically based infarct within the posterior right temporal lobe.   Mild bilateral ethmoid sinus mucosal thickening.  PATIENT EDUCATION: Education details: Herbalist the [employer] Robin [name] educated: Patient and sister, Robin [name] Education method: Explanation and Handouts Education comprehension: verbalized understanding and needs further education   GOALS: Goals reviewed with patient?  Yes  SHORT TERM GOALS: Target date: 07/10/2023  Pt will increase verbal naming of common objects/pictures to 75% acc when provided with mod multimodality cues Baseline: 80% when allowed to write response or provided mod/max cues Goal status: MET for 100% acc for mod/max cues (phonemic cue provided); change to 100% acc with mod cues; met   2.  Pt will describe objects and pictures by providing at least three salient features (single words ok) as judged by clinician with 80% acc when provided mod cues. Baseline: 70% mod/max cues Goal status: MET for 100% with mod/max cues provided; change to 100% acc with mod cues; met   3.  Pt will complete single word sentence completion tasks [facility]) with 90% acc when provided with min/mod multimodality cues.  Baseline: 75% mod cues of initial phoneme modeled Goal status: MET for 100% acc with mi/mod cues provided; change to 100% with min cues; Partially met, d/c  4.  Pt will complete basic level reading comprehension tasks (picture cues for sentence level) with 80%  acc with provided mi/mod cues. Baseline: 100% word to picture matching f=5, 0/5 sentences Goal status: MET for single words, continue for sentences; Partially Met, use on device  5.  Pt will verbally describe action photos with use of "who" "what doing" template with 80% acc and min/mod cues.  Baseline: 70% mod cues. Goal status: MET for 100% acc with mod/max cues; change to 100% with mod cues; Met  6.  Pt will complete personally relevant responsive naming tasks with 90% acc with use of written cues as needed. Baseline: 75% acc Goal status: MET  7. Pt will use a communication device to cue themselves to speak (reading or imitating words) during 1:1 conversations with SLP with min/mod cues over 3 consecutive sessions.             Baseline: Max assist             Goal status: ONGOING   8. Pt will respond to personal preference questions by answering with alternative communication device  with 90% acc and min assist.             Baseline: Max assist             Goal status: ONGOING   9. Pt will use alternative means of communication (low tech and high tech) to maximize independence with communication and find a system that is most efficient for her.             Baseline: Trial of Lingraphica, Spoken App, letter board, etc             Goal status: ONGOING   10. Pt will use alternative means of communication to locate list of medications, health symptoms, and personal/bio information with indirect cues.             Baseline: mod assist             Goal status: ONGOING  LONG TERM GOALS: Target date: 07/25/2023  Pt will communicate moderately complex wants/needs to [facility] with min assist from communication partner and use of multimodality communication strategies. Baseline: Pt requires mod assist from communication partner  Goal status: ONGOING   ASSESSMENT:  PREVIOUS TREATMENT:     03/26/23: Pt accompanied to therapy this date by her caregiver, Agustin Cree and her sister. They brought in a notebook and completed a good portion of the personalized communication template. SLP reviewed the information with Pt and Pt asked to read names of family members. Pt able to read 75% of the names and benefited from cues to look away/provide diversion and return to task when she could not read it right away. Family provided education on ways to help her with this at home. She was able to write the letters with her finger on the table 90% of the time and even orally spell when unable to say the word. Pt completed automatic speech tasks with 80% acc, but needed cues to decrease rate to increase accuracy of responses. Goals and evaluation were reviewed. Pt smiling and engaged throughout the session. She occasionally stated, "I can't do that", but with encouragement she tried to complete the request. Her sister commented that she seems to be able to communicate with some family members better than others  and SLP suggested that she ascertain the content of conversation (ie. Harder to talk about personal needs with sister than lighter conversation with an aunt). Target above goals next session.   04/03/2023: Pt accompanied by caregiver, Agustin Cree and brought in her notebook. Pt  completed confrontation naming task with 50% acc without cues and increased to 90% acc with cues (Pt able to use letter board to spell the word 90% of the time). She completed single word sentence completion (SWSC) task by providing accurate verbal response when looking at pictures with 80% acc with mi/mod cues. She was then asked to repeat the word x3 and SLP interjected with confounds at times and Pt asked to return to word. This was very challenging for Pt with performance improved throughout trials. She was able to provide a gesture for each work with 100% acc. She answered responsive naming questions with use of personal communication template as needed with 95% acc. SLP provided strategies for accurate number production by having Pt count on her fingers and show the number on her hands and then verbalize the number, which appeared effective. Introduce the "Say it" set list of words next session.   04/09/2023: Pt accompanied by her caregiver, Darlene. Pt indicated that she went to the ED yesterday due to chest pain and nose bleed. She currently feels much better. She completed confrontation naming of high frequency words with 50% acc, single word sentence completion task with 100% acc with min cues, responsive naming with 90% and mod cues, and elicited appropriate gesture for words with 100% acc with min assist. She was cued to use her personalized communication template to help find answers to responsive naming tasks. During confrontation naming tasks, she benefited from being able to write the word, but had difficulty saying the word without phonemic and sentence cues. New words were added to her communication template (purse, keys,  phone, water, etc). Continue to target goals above and focus on responsive naming tasks for personal information.     04/14/2023: Pt accompanied by her caregiver, Darlene. She verbalized that she went to [place] over the weekend to see friends/family. She completed responsive naming tasks in conversation regarding personally relevant information with 90% acc with ~75% acc for verbal responses and 100% when allowed to write or use written cues. She completed confrontation naming task when looking at icons on a tablet with 70% acc with mod cues, but was able to repeat the name of the item after model provided, with 90% acc. She was introduced to the [place] tablet today and she expressed interest in using it to help her communicate. She indicates that she thinks she has something similar at home and will bring in next session. She was able to quickly point to 7 fruits that she likes to eat and was able to repeat 6/7 after model provided. Update goals next session.       04/17/23:  Pt was accompanied by a new caregiver today because Darlene had an appointment. Pt was able to communicate this to SLP with use of written personalized template to identify the name "Darlene". Harriett Sine answered personally relevant responsive naming questions (regarding names of friends, family, and medical professionals) with 100% acc with use of communication support as needed and accurate verbal response 90% of the time with min cues. Pt verbalized a subject, verb/action, and object when looking at action photos with 92% acc with mi/mod cues and was then able to repeat with written model provided with 100% acc with min assist. She then completed single word sentence completion task with 100% acc with min cues. Helayne occasionally shuts down when a task feels out of her reach or too challenging and needs encouragement to try the task. She was trying to verbalize "smelling" and produced "  smoking" instead, but was able to break the  perseveration and produce "smelling" on ~70% of cued trials. She was given divergent naming task with word bank provided for homework and completed trial x 2 before ending the session. Next session, continue to expansive naming tasks to generate sentences when looking at photos.   04/23/2023: Pt accompanied to therapy by Darlene. She completed responsive naming task by answering personally relevant questions with use of written cues provided with 100% acc. She is unable to verbalize responses independently without use of written cues or initial phoneme cue from SLP. She named common objects with 100% acc when allowed to write her response or when given initial phoneme cue and she completed single word sentence completion tasks with 100% acc with provided mi/mod cues. She independently uses total communication strategies by gesturing, writing, or looking for written response when she is unable to verbalize her response. Unfortunately, she needs environmental supports via communication partner, written templates, gestures, and ability to hand write responses to communicate her thoughts. Pt would make an excellent candidate for a dynamic communication system. We have tried some apps on her phone, however she would benefit from a dedicated speech generating device. Goals have been updated and will request a trial of a communication device. Pt is in agreement with plan of care. Requesting 2x/week for 4 more weeks.   04/28/23: Pt accompanied by a different caregiver today and she was able to locate her name by finding it in her contacts on her phone. She completed responsive naming tasks with 100% acc with use of communication template, described action pictures using subject and verb with 100% acc with mod cues, completed SWSC tasks with 100% acc with min cues, confrontation naming tasks for high frequency words with 100% acc with mod/max cues. She was able to repeat 5 word sentences with 100% acc when provided mi/mod  cues from SLP. SLP submitted request for AAC device trials and Pt expressed excitement over having a more efficient way to help her communicate. Continue to target goals next session.   04/30/2023: SLP submitted request to check insurance benefits for alternative communication device, awaiting response. Pt completed HEP. She used verbal responses, letter board, communication template, and self written responses to answer personally relevant responsive naming tasks with 94% acc with mi/mod use of non-verbal responses. She completed confrontation naming of high frequency pictures with 100% acc with mod cues. Pt expressed that mentally fatiguing for her to verbalize responses at times. SLP provided reassurance and positive reinforcement toward Pt gains this session. She continues to be motivated and uses alternative communication strategies (even able to spell words aloud when unable to verbalize the word) with indirect cues. Continue targeting goals and trials of AAC devices.   05/21/2023: Pt unable to attend therapy in the last few weeks due to scheduling conflicts with her ability to obtain transportation. She was accompanied to today's visit by her sister and her new caregiver. SLP introduced the [place] communication device today. Pt able to turn the device on and select icons with cues from SLP (orientation factors and the specific folders). SLP opened the page to breakfast items and Pt selected 10 items she enjoys eating and was encouraged to use the device to cue herself to speak if unable to verbalize the word. She assisted SLP in adding personally relevant information to her communication template that she would like to have on the device. Her schedule has been updated and she will be able to attend more consistently  and expressed feeling positive about using a speech generating device to assist with communication. Device will need further personalization before we begin a trial.   05/26/2023: Pt  accompanied to therapy by her new caregiver. She communicated to me that her son was shot and is in the hospital. SLP showed Pt how to add icons and folders to the [place] device. Pt requested that we add "[facility]" and was able to then return demonstrate navigation of the device to select the icon and use it to prompt verbal production from her. She also identified two Gospel singers she likes and we added those icons. She was encouraged to verbalize her responses first when answering Wh-questions and then use the device to prompt speech. She verbalized that her son was "83" years old, however when she was prompted to write the age on the dry erase board feature, she wrote his correct age. Nija can often write the word she wants to say when she is unable to verbalize it. She was encouraged to use the camera feature at home to take photos to add to the [employer]. Continue with device navigation and personalization next session.   05/28/2023: Pt used the Lingraphica device at home by completing verbal expression activities in therapy section.  SLP facilitated session by adding additional icons and folders for Pt to express herself in session and with her caregivers at home. She was able to select food choices and restaurants with min assist once opened to the page. SLP cued the Pt to use the device to cue herself to speak (model from device and then attempt to repeat). She expressed that she would like to be able to vote, but didn't think that she was able to. SLP encouraged Pt to have someone from her family to take her to vote and she can request assist as needed. Pt smiled throughout the session and used multi-modality systems to communicate with SLP (gestures, verbal response, AAC device, written responses, and occasionally letter board). Pt continues to be motivated and shows excellent ability to use an AAC. Continue with trials and goals have been updated, cert request.  06/02/2023:  Pt arrived on [facility] without her caregiver this AM and she appeared pleased that she was able to accomplish this on her own. A new caregiver, Raechel Ache, arrived later on in our treatment session. SLP encouraged Pt to introduce herself to the new caregiver and use the device as needed to supplement. Pt required mod cues via offering binary choices (did you have a stroke or a heart attack?) to communicate to caregiver. Harriett Sine added photos to the [place] device and SLP facilitated adding these to her "people" page and needed to rotate pictures etc. SLP created a flow sheet for Pt to follow to learn how to add icons and move to different folders. Pt was able to return demonstrate with moderate cueing. She benefits from repetition of task and was encouraged to practice this at home, while fresh on her mind. She was able to answer responsive naming questions with use of device and verbal response with ~85% acc with min cues for device navigation. Next session, SLP will ask Pt to locate recently added icons to answer responsive naming questions and add a page for medical information.           06/05/2023: [facility] did not pick Pt up for her appointment this AM, but we were able to reschedule her for a later time today. She was cued to refrain from pointing/gesturing  when she wanted an action (pick up her bag, bring her phone, order food) and to verbalize first. She was able to produce single words to convey meaning. Session focused on navigation of Lingraphica device and adding new icons to report symptoms. Midge was able to turn on the device, locate the "people" tab and relay who her medical team and caregivers included. She identified three pain descriptors she wanted added: left shoulder pain, bilateral knee pain, and left toe pain. SLP also added stroke related information. We had difficulty copying and moving icons to other folders, so SLP will reach out to Lingraphica to facilitate this. Pt is more efficiently able to  use the Lingraphica device over the letter board and written template, however SLP continues to encourage Pt to use all methods of communication at this time. Continue plan of care and address goals as stated above.   06/09/2023: Telicia was accompanied by her caregiver, Mae. She was able to turn on the Lingraphica device and navigate to specific folders with indirect cues. She added three phots of items in her home: blender, TV, and her bed. We created an icon for blender and placed in the "kitchen" folder. She was able to then locate the icon, but it did require about 5 steps to arrive to location. She answered personally relevant responsive naming questions with use of device. When SLP asked if she voted, she said yes and that her "Cathren Harsh" took her, however SLP then cued her to use the device to locate the icon of who took her. She then accurately identified her sister. Once SLP assisted with navigation to the restaurants page, she was able to select the placed she goes to and got rid of the ones she does not. Under the [employer] icon, we changed it to a folder and she added: fish sandwich, fries, and sweet tea. She navigated to the medical information page and showed current body aches. Continue to have Pt navigate the device and communicate more complex ideas.  06/11/2023: Vivion was accompanied by her caregiver, Mae. Pt and caregiver added some restaurants into the [place] device, however it was discovered that there are two "restaurant" folders and they are different. Together, we merged the two folders into one. She was able to navigate to specific folders with min cues. She was shown how to follow the listed word sheet at the top to help orient her within the device. We had some trouble copying and moving folders so I emailed Lingraphica to assist with this. She was reminded to not only select the icon, but to try to use it to prompt her to say what the icon represents. She is able to do this with single  word responses better than a full sentence. She is becoming more adept with device navigation and was encouraged to add some more photos over the weekend. Continue with device personalization and navigation.   06/16/2023: Pt added a new caregiver with name and photo over the weekend and placed it in the correct location. She was able to navigate the device and show me what she added. Pt cued to use the device to answer "wh" questions related to favorites, family, and current events and she answered with 95% acc with min assist for more efficient navigation. She often had to look in a few folders to find the information she wanted. We added some phrases and sentences to the "Fast Talk" folder and she was then able to use each phrase appropriately with min prompt (find  the icon to request that transportation service be called to pick you up". We also navigated to the "Therapy" folder and she completed spelling of pictured objects when given the three letters to place in order (95%). She was unable to imitate/repeat the phrases from the video recorded section, so we can try this again next time. Pt pointed to a list in her notebook of medications and phone numbers that she indicated she wanted to place in the [place]. SLP will pursue completion of evaluation form, as Pt has verbally expressed that she would like to obtain a speaking device for herself.   CURRENT TREATMENT:  Pt reported that someone from [place] called her sister. Pt assisted in completing the assignment of benefits portion online. SLP requested specific biographical information from Pt and she used her notebook, Lingraphica, and gestures to communicate. SLP cued Pt to first try to verbalize her responses and also to use the Lingraphica to prompt a verbal response (when used as a model). She pointed to information written in her notebook that she wanted transferred into her speech generating device. Once information was added, Pt navigated  to the newly added information with min cues from SLP. Pt will need ongoing repetition to locate all the information in an efficient manner. Continue with device navigation and have Pt introduce topics to discuss.                                                                                                       DATE: 06/18/23  CLINICAL IMPRESSION: (from initial evaluation on 03/19/2023) Patient is a 57 y.o. female who was seen today for speech/language evaluation s/p CVA in May of 2021 with only limited follow up SLP therapy via home health in 2021 when she was staying with her daughter. [name] sister accompanied her to the evaluation and would like for her sister to be able to communicate her wants and needs verbally. 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 Pt has difficulty verbally labeling high frequency objects (but was able to write some responses), repeating longer words/phrases, and reading phrases. She was able to match 5/5 high frequency objects to single words, but was only able to orally read/label 1/5 words. Pt benefited from gesturing to communicate her intent and this occasionally helped elicit word production and she also benefited from SLP phonemic cues and single word sentence completion cues. Recommend SLP therapy to address communication deficits and increase independence with communication 2x/week for 4-8 weeks.  OBJECTIVE IMPAIRMENTS: include executive functioning, expressive language, receptive language, aphasia, and apraxia. These impairments are limiting patient from managing medications, managing appointments, managing finances, and effectively  communicating at home and in community. Factors affecting potential to achieve goals and functional outcome are ability to learn/carryover information, severity of impairments, and time post onset of aphasia (2021) .  Patient will benefit from skilled SLP services to address above impairments and improve overall function.  REHAB POTENTIAL: Good  PLAN:  SLP FREQUENCY: 2x/week  SLP DURATION: 8 weeks  PLANNED INTERVENTIONS: Cueing hierachy, Internal/external aids, Functional tasks, Multimodal communication approach, SLP instruction and feedback, Compensatory strategies, Patient/family education, and Re-evaluation    Thank you,  Havery Moros, CCC-SLP [phone]  Cameryn Schum, CCC-SLP 06/18/2023, 10:12 AM

## 2023-06-23 ENCOUNTER — Other Ambulatory Visit: Payer: Self-pay | Admitting: Physician Assistant

## 2023-06-23 ENCOUNTER — Ambulatory Visit (HOSPITAL_COMMUNITY): Payer: 59 | Admitting: Speech Pathology

## 2023-06-23 ENCOUNTER — Encounter (HOSPITAL_COMMUNITY): Payer: Self-pay | Admitting: Speech Pathology

## 2023-06-23 DIAGNOSIS — R41841 Cognitive communication deficit: Secondary | ICD-10-CM

## 2023-06-23 DIAGNOSIS — R4701 Aphasia: Secondary | ICD-10-CM

## 2023-06-23 DIAGNOSIS — I10 Essential (primary) hypertension: Secondary | ICD-10-CM

## 2023-06-23 DIAGNOSIS — I69890 Apraxia following other cerebrovascular disease: Secondary | ICD-10-CM

## 2023-06-23 NOTE — Therapy (Signed)
OUTPATIENT SPEECH LANGUAGE PATHOLOGY TREATMENT   Patient Name: Robin [name] MRN: [id number] DOB:[birth year], 57 y.o., female Today's Date: 06/23/2023  PCP: Jarold Motto, PA REFERRING PROVIDER: Jarold Motto, PA  END OF SESSION:  End of Session - 06/23/23 1019     Visit Number 19    Number of Visits 24    Date for SLP Re-Evaluation 07/03/23    Authorization Type UHC Medicare and Medicaid secondary; Dual complete   eff 08/05/22 ded 240 met oop 8850 met 2034.20 limit-no auth-no co ins-20%   SLP Start Time 0935    SLP Stop Time  1020    SLP Time Calculation (min) 45 min    Activity Tolerance Patient tolerated treatment well             Past Medical History:  Diagnosis Date   A-fib (HCC)    Abdominal aortic aneurysm (AAA) (HCC)    Allergy    Anemia    Arthritis    Atrial fibrillation (HCC)    CHF (congestive heart failure) (HCC)    a. EF at 30-35% by echo in 08/2018 b. EF at 35% by repeat echo in 05/2019 c. EF improved to 65-70% in 2021 d. EF at 40-45% in 08/2021.   Chronic abdominal pain    Chronic atrial fibrillation (HCC)    Cocaine abuse (HCC)    COPD (chronic obstructive pulmonary disease) (HCC)    Diabetes mellitus without complication (HCC)    Essential hypertension, benign    Expressive aphasia    Expressive aphasia    post CVA   Fatty liver    GERD (gastroesophageal reflux disease)    Gout 2016   Normal coronary arteries    3/10 - following abnormal Myoview   Ovarian cyst    Stroke (HCC) 12/26/2019   Right sided weakness, and expressive aphasia   Stroke [facility])    Thoracic ascending aortic aneurysm (HCC)    4 cm 10/31/19 CTA   Type 2 diabetes mellitus (HCC)    type II   Past Surgical History:  Procedure Laterality Date   ABDOMINAL HYSTERECTOMY  09/10/2011   Procedure: HYSTERECTOMY ABDOMINAL;  Surgeon: Tilda Burrow, MD;  Location: [facility];  Service: Gynecology;  Laterality: N/A;  Abdominal hysterectomy   CESAREAN SECTION  [phone], and 1994    CHOLECYSTECTOMY  1995   IR 3D INDEPENDENT WKST  03/16/2020   IR ANGIO INTRA EXTRACRAN SEL INTERNAL CAROTID BILAT MOD SED  03/16/2020   IR ANGIO VERTEBRAL SEL SUBCLAVIAN INNOMINATE UNI L MOD SED  03/16/2020   IR ANGIO VERTEBRAL SEL VERTEBRAL UNI R MOD SED  03/16/2020   IR CT HEAD LTD  12/27/2019   IR PERCUTANEOUS ART THROMBECTOMY/INFUSION INTRACRANIAL INC DIAG ANGIO  12/27/2019   IR US GUIDE VASC ACCESS RIGHT  12/27/2019   IR US GUIDE VASC ACCESS RIGHT  03/16/2020   RADIOLOGY WITH ANESTHESIA N/A 12/27/2019   Procedure: IR WITH ANESTHESIA;  Surgeon: Julieanne Cotton, MD;  Location: [facility];  Service: Radiology;  Laterality: N/A;   SCAR REVISION  09/10/2011   Procedure: SCAR REVISION;  Surgeon: Tilda Burrow, MD;  Location: [facility];  Service: Gynecology;  Laterality: N/A;  Wide Excision of old Cicatrix   TUBAL LIGATION  1994   Patient Active Problem List   Diagnosis Date Noted   Acute on chronic systolic CHF (congestive heart failure) (HCC) 11/27/2021   Stage 3b chronic kidney disease (CKD) (HCC) 11/27/2021   Microcytic anemia 11/27/2021   Lactic acidosis 11/27/2021  Diabetes mellitus (HCC) 03/09/2021   Type 2 diabetes mellitus with stage 3a chronic kidney disease, with long-term current use of insulin (HCC) 03/09/2021   Acute metabolic encephalopathy 12/31/2020   Hyperglycemia 12/31/2020   Prolonged QT interval 05/12/2020   History of CVA with residual deficit 05/12/2020   Atrial fibrillation, chronic (HCC) 05/12/2020   Right hemiparesis (HCC)    Essential hypertension    Chronic diastolic congestive heart failure (HCC)    Dysphagia due to recent stroke 01/05/2020   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 01/05/2020   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 12/27/2019   Thoracic aortic aneurysm without rupture (HCC)    Atrial fibrillation with RVR (HCC) 05/10/2019   Type 2 diabetes mellitus with diabetic autonomic neuropathy, with long-term current use of insulin  (HCC) 09/01/2018   Cocaine abuse (HCC) 08/25/2018   Cigarette nicotine dependence, uncomplicated 06/07/2015   GERD (gastroesophageal reflux disease) 06/07/2011   COPD (chronic obstructive pulmonary disease) (HCC) 06/07/2011   Arthritis 06/07/2011    ONSET DATE: May 2021   REFERRING DIAG: R47.01 (ICD-10-CM) - Expressive aphasia  THERAPY DIAG:  Aphasia  Apraxia following other cerebrovascular disease  Cognitive communication deficit  Rationale for Evaluation and Treatment: Rehabilitation  SUBJECTIVE:   SUBJECTIVE STATEMENT: "I need to add that."  Pt accompanied by: self and family member  PERTINENT HISTORY: Robin [name] is a pleasant 57 y.o. female with medical history significant for atrial fibrillation on Eliquis, chronic systolic CHF, CKD 3B, insulin-dependent diabetes mellitus, polysubstance abuse, COPD, GERD, HTN, and history of CVA with residual right-sided weakness and aphasia (L MCA CVA and L ICA occlusion s/p thrombectomy on 12/27/2019 followed by CIR stay where she received ST/OT/PT until d/c home on 02/02/20 and then received some [place] SLP services). She is referred for outpatient SLP therapy by Jarold Motto, PA to facilitate increased independence with communication.  PAIN:  Are you having pain? No  PATIENT GOALS: Increase ability to talk  OBJECTIVE:   DIAGNOSTIC FINDINGS:  MRI 11/13/2020: IMPRESSION: No evidence of acute intracranial abnormality, including acute infarction.   Redemonstrated large chronic left MCA territory infarct.   Redemonstrated small chronic cortically based infarct within the posterior right temporal lobe.   Mild bilateral ethmoid sinus mucosal thickening.  PATIENT EDUCATION: Education details: Herbalist the [employer] educated: Patient and sister, Robin [name] Education method: Explanation and Handouts Education comprehension: verbalized understanding and needs further education   GOALS: Goals reviewed with patient?  Yes  SHORT TERM GOALS: Target date: 07/10/2023  Pt will increase verbal naming of common objects/pictures to 75% acc when provided with mod multimodality cues Baseline: 80% when allowed to write response or provided mod/max cues Goal status: MET for 100% acc for mod/max cues (phonemic cue provided); change to 100% acc with mod cues; met   2.  Pt will describe objects and pictures by providing at least three salient features (single words ok) as judged by clinician with 80% acc when provided mod cues. Baseline: 70% mod/max cues Goal status: MET for 100% with mod/max cues provided; change to 100% acc with mod cues; met   3.  Pt will complete single word sentence completion tasks [facility]) with 90% acc when provided with min/mod multimodality cues.  Baseline: 75% mod cues of initial phoneme modeled Goal status: MET for 100% acc with mi/mod cues provided; change to 100% with min cues; Partially met, d/c  4.  Pt will complete basic level reading comprehension tasks (picture cues for sentence level) with 80%  acc with provided mi/mod cues. Baseline: 100% word to picture matching f=5, 0/5 sentences Goal status: MET for single words, continue for sentences; Partially Met, use on device  5.  Pt will verbally describe action photos with use of "who" "what doing" template with 80% acc and min/mod cues.  Baseline: 70% mod cues. Goal status: MET for 100% acc with mod/max cues; change to 100% with mod cues; Met  6.  Pt will complete personally relevant responsive naming tasks with 90% acc with use of written cues as needed. Baseline: 75% acc Goal status: MET  7. Pt will use a communication device to cue themselves to speak (reading or imitating words) during 1:1 conversations with SLP with min/mod cues over 3 consecutive sessions.             Baseline: Max assist             Goal status: ONGOING   8. Pt will respond to personal preference questions by answering with alternative communication device  with 90% acc and min assist.             Baseline: Max assist             Goal status: ONGOING   9. Pt will use alternative means of communication (low tech and high tech) to maximize independence with communication and find a system that is most efficient for her.             Baseline: Trial of Lingraphica, Spoken App, letter board, etc             Goal status: ONGOING   10. Pt will use alternative means of communication to locate list of medications, health symptoms, and personal/bio information with indirect cues.             Baseline: mod assist             Goal status: ONGOING  LONG TERM GOALS: Target date: 07/25/2023  Pt will communicate moderately complex wants/needs to [facility] with min assist from communication partner and use of multimodality communication strategies. Baseline: Pt requires mod assist from communication partner  Goal status: ONGOING   ASSESSMENT:  PREVIOUS TREATMENT:     03/26/23: Pt accompanied to therapy this date by her caregiver, Robin [name] and her sister. They brought in a notebook and completed a good portion of the personalized communication template. SLP reviewed the information with Pt and Pt asked to read names of family members. Pt able to read 75% of the names and benefited from cues to look away/provide diversion and return to task when she could not read it right away. Family provided education on ways to help her with this at home. She was able to write the letters with her finger on the table 90% of the time and even orally spell when unable to say the word. Pt completed automatic speech tasks with 80% acc, but needed cues to decrease rate to increase accuracy of responses. Goals and evaluation were reviewed. Pt smiling and engaged throughout the session. She occasionally stated, "I can't do that", but with encouragement she tried to complete the request. Her sister commented that she seems to be able to communicate with some family members better than others  and SLP suggested that she ascertain the content of conversation (ie. Harder to talk about personal needs with sister than lighter conversation with an aunt). Target above goals next session.   04/03/2023: Pt accompanied by caregiver, Robin [name] and brought in her notebook. Pt  completed confrontation naming task with 50% acc without cues and increased to 90% acc with cues (Pt able to use letter board to spell the word 90% of the time). She completed single word sentence completion (SWSC) task by providing accurate verbal response when looking at pictures with 80% acc with mi/mod cues. She was then asked to repeat the word x3 and SLP interjected with confounds at times and Pt asked to return to word. This was very challenging for Pt with performance improved throughout trials. She was able to provide a gesture for each work with 100% acc. She answered responsive naming questions with use of personal communication template as needed with 95% acc. SLP provided strategies for accurate number production by having Pt count on her fingers and show the number on her hands and then verbalize the number, which appeared effective. Introduce the "Say it" set list of words next session.   04/09/2023: Pt accompanied by her caregiver, Robin [name]. Pt indicated that she went to the ED yesterday due to chest pain and nose bleed. She currently feels much better. She completed confrontation naming of high frequency words with 50% acc, single word sentence completion task with 100% acc with min cues, responsive naming with 90% and mod cues, and elicited appropriate gesture for words with 100% acc with min assist. She was cued to use her personalized communication template to help find answers to responsive naming tasks. During confrontation naming tasks, she benefited from being able to write the word, but had difficulty saying the word without phonemic and sentence cues. New words were added to her communication template (purse, keys,  phone, water, etc). Continue to target goals above and focus on responsive naming tasks for personal information.     04/14/2023: Pt accompanied by her caregiver, Robin [name]. She verbalized that she went to [place] over the weekend to see friends/family. She completed responsive naming tasks in conversation regarding personally relevant information with 90% acc with ~75% acc for verbal responses and 100% when allowed to write or use written cues. She completed confrontation naming task when looking at icons on a tablet with 70% acc with mod cues, but was able to repeat the name of the item after model provided, with 90% acc. She was introduced to the [place] tablet today and she expressed interest in using it to help her communicate. She indicates that she thinks she has something similar at home and will bring in next session. She was able to quickly point to 7 fruits that she likes to eat and was able to repeat 6/7 after model provided. Update goals next session.       04/17/23:  Pt was accompanied by a new caregiver today because Robin [name] had an appointment. Pt was able to communicate this to SLP with use of written personalized template to identify the name "Robin [name]". Robin [name] answered personally relevant responsive naming questions (regarding names of friends, family, and medical professionals) with 100% acc with use of communication support as needed and accurate verbal response 90% of the time with min cues. Pt verbalized a subject, verb/action, and object when looking at action photos with 92% acc with mi/mod cues and was then able to repeat with written model provided with 100% acc with min assist. She then completed single word sentence completion task with 100% acc with min cues. Robin [name] occasionally shuts down when a task feels out of her reach or too challenging and needs encouragement to try the task. She was trying to verbalize "smelling" and produced "  smoking" instead, but was able to break the  perseveration and produce "smelling" on ~70% of cued trials. She was given divergent naming task with word bank provided for homework and completed trial x 2 before ending the session. Next session, continue to expansive naming tasks to generate sentences when looking at photos.   04/23/2023: Pt accompanied to therapy by Robin [name]. She completed responsive naming task by answering personally relevant questions with use of written cues provided with 100% acc. She is unable to verbalize responses independently without use of written cues or initial phoneme cue from SLP. She named common objects with 100% acc when allowed to write her response or when given initial phoneme cue and she completed single word sentence completion tasks with 100% acc with provided mi/mod cues. She independently uses total communication strategies by gesturing, writing, or looking for written response when she is unable to verbalize her response. Unfortunately, she needs environmental supports via communication partner, written templates, gestures, and ability to hand write responses to communicate her thoughts. Pt would make an excellent candidate for a dynamic communication system. We have tried some apps on her phone, however she would benefit from a dedicated speech generating device. Goals have been updated and will request a trial of a communication device. Pt is in agreement with plan of care. Requesting 2x/week for 4 more weeks.   04/28/23: Pt accompanied by a different caregiver today and she was able to locate her name by finding it in her contacts on her phone. She completed responsive naming tasks with 100% acc with use of communication template, described action pictures using subject and verb with 100% acc with mod cues, completed SWSC tasks with 100% acc with min cues, confrontation naming tasks for high frequency words with 100% acc with mod/max cues. She was able to repeat 5 word sentences with 100% acc when provided mi/mod  cues from SLP. SLP submitted request for AAC device trials and Pt expressed excitement over having a more efficient way to help her communicate. Continue to target goals next session.   04/30/2023: SLP submitted request to check insurance benefits for alternative communication device, awaiting response. Pt completed HEP. She used verbal responses, letter board, communication template, and self written responses to answer personally relevant responsive naming tasks with 94% acc with mi/mod use of non-verbal responses. She completed confrontation naming of high frequency pictures with 100% acc with mod cues. Pt expressed that mentally fatiguing for her to verbalize responses at times. SLP provided reassurance and positive reinforcement toward Pt gains this session. She continues to be motivated and uses alternative communication strategies (even able to spell words aloud when unable to verbalize the word) with indirect cues. Continue targeting goals and trials of AAC devices.   05/21/2023: Pt unable to attend therapy in the last few weeks due to scheduling conflicts with her ability to obtain transportation. She was accompanied to today's visit by her sister and her new caregiver. SLP introduced the [place] communication device today. Pt able to turn the device on and select icons with cues from SLP (orientation factors and the specific folders). SLP opened the page to breakfast items and Pt selected 10 items she enjoys eating and was encouraged to use the device to cue herself to speak if unable to verbalize the word. She assisted SLP in adding personally relevant information to her communication template that she would like to have on the device. Her schedule has been updated and she will be able to attend more consistently  and expressed feeling positive about using a speech generating device to assist with communication. Device will need further personalization before we begin a trial.   05/26/2023: Pt  accompanied to therapy by her new caregiver. She communicated to me that her son was shot and is in the hospital. SLP showed Pt how to add icons and folders to the [place] device. Pt requested that we add "Diet [facility]" and was able to then return demonstrate navigation of the device to select the icon and use it to prompt verbal production from her. She also identified two Gospel singers she likes and we added those icons. She was encouraged to verbalize her responses first when answering Wh-questions and then use the device to prompt speech. She verbalized that her son was "37" years old, however when she was prompted to write the age on the dry erase board feature, she wrote his correct age. Maurya can often write the word she wants to say when she is unable to verbalize it. She was encouraged to use the camera feature at home to take photos to add to the [employer]. Continue with device navigation and personalization next session.   05/28/2023: Pt used the Lingraphica device at home by completing verbal expression activities in therapy section.  SLP facilitated session by adding additional icons and folders for Pt to express herself in session and with her caregivers at home. She was able to select food choices and restaurants with min assist once opened to the page. SLP cued the Pt to use the device to cue herself to speak (model from device and then attempt to repeat). She expressed that she would like to be able to vote, but didn't think that she was able to. SLP encouraged Pt to have someone from her family to take her to vote and she can request assist as needed. Pt smiled throughout the session and used multi-modality systems to communicate with SLP (gestures, verbal response, AAC device, written responses, and occasionally letter board). Pt continues to be motivated and shows excellent ability to use an AAC. Continue with trials and goals have been updated, cert request.  06/02/2023:  Pt arrived on [facility] without her caregiver this AM and she appeared pleased that she was able to accomplish this on her own. A new caregiver, Robin [name], arrived later on in our treatment session. SLP encouraged Pt to introduce herself to the new caregiver and use the device as needed to supplement. Pt required mod cues via offering binary choices (did you have a stroke or a heart attack?) to communicate to caregiver. Robin [name] added photos to the [place] device and SLP facilitated adding these to her "people" page and needed to rotate pictures etc. SLP created a flow sheet for Pt to follow to learn how to add icons and move to different folders. Pt was able to return demonstrate with moderate cueing. She benefits from repetition of task and was encouraged to practice this at home, while fresh on her mind. She was able to answer responsive naming questions with use of device and verbal response with ~85% acc with min cues for device navigation. Next session, SLP will ask Pt to locate recently added icons to answer responsive naming questions and add a page for medical information.           06/05/2023: [facility] did not pick Pt up for her appointment this AM, but we were able to reschedule her for a later time today. She was cued to refrain from pointing/gesturing  when she wanted an action (pick up her bag, bring her phone, order food) and to verbalize first. She was able to produce single words to convey meaning. Session focused on navigation of Lingraphica device and adding new icons to report symptoms. Robin [name] was able to turn on the device, locate the "people" tab and relay who her medical team and caregivers included. She identified three pain descriptors she wanted added: left shoulder pain, bilateral knee pain, and left toe pain. SLP also added stroke related information. We had difficulty copying and moving icons to other folders, so SLP will reach out to Lingraphica to facilitate this. Pt is more efficiently able to  use the Lingraphica device over the letter board and written template, however SLP continues to encourage Pt to use all methods of communication at this time. Continue plan of care and address goals as stated above.   06/09/2023: Robin [name] was accompanied by her caregiver, Robin [name]. She was able to turn on the Lingraphica device and navigate to specific folders with indirect cues. She added three phots of items in her home: blender, TV, and her bed. We created an icon for blender and placed in the "kitchen" folder. She was able to then locate the icon, but it did require about 5 steps to arrive to location. She answered personally relevant responsive naming questions with use of device. When SLP asked if she voted, she said yes and that her "Robin [name]" took her, however SLP then cued her to use the device to locate the icon of who took her. She then accurately identified her sister. Once SLP assisted with navigation to the restaurants page, she was able to select the placed she goes to and got rid of the ones she does not. Under the [employer] icon, we changed it to a folder and she added: fish sandwich, fries, and sweet tea. She navigated to the medical information page and showed current body aches. Continue to have Pt navigate the device and communicate more complex ideas.  06/11/2023: Robin [name] was accompanied by her caregiver, Robin [name]. Pt and caregiver added some restaurants into the [place] device, however it was discovered that there are two "restaurant" folders and they are different. Together, we merged the two folders into one. She was able to navigate to specific folders with min cues. She was shown how to follow the listed word sheet at the top to help orient her within the device. We had some trouble copying and moving folders so I emailed Lingraphica to assist with this. She was reminded to not only select the icon, but to try to use it to prompt her to say what the icon represents. She is able to do this with single  word responses better than a full sentence. She is becoming more adept with device navigation and was encouraged to add some more photos over the weekend. Continue with device personalization and navigation.   06/16/2023: Pt added a new caregiver with name and photo over the weekend and placed it in the correct location. She was able to navigate the device and show me what she added. Pt cued to use the device to answer "wh" questions related to favorites, family, and current events and she answered with 95% acc with min assist for more efficient navigation. She often had to look in a few folders to find the information she wanted. We added some phrases and sentences to the "Fast Talk" folder and she was then able to use each phrase appropriately with min prompt (find  the icon to request that transportation service be called to pick you up". We also navigated to the "Therapy" folder and she completed spelling of pictured objects when given the three letters to place in order (95%). She was unable to imitate/repeat the phrases from the video recorded section, so we can try this again next time. Pt pointed to a list in her notebook of medications and phone numbers that she indicated she wanted to place in the [place]. SLP will pursue completion of evaluation form, as Pt has verbally expressed that she would like to obtain a speaking device for herself.   06/18/23: Pt reported that someone from [place] called her sister. Pt assisted in completing the assignment of benefits portion online. SLP requested specific biographical information from Pt and she used her notebook, Lingraphica, and gestures to communicate. SLP cued Pt to first try to verbalize her responses and also to use the Lingraphica to prompt a verbal response (when used as a model). She pointed to information written in her notebook that she wanted transferred into her speech generating device. Once information was added, Pt navigated to the  newly added information with min cues from SLP. Pt will need ongoing repetition to locate all the information in an efficient manner. Continue with device navigation and have Pt introduce topics to discuss.   CURRENT TREATMENT: Pt accompanied to therapy by her caregiver, Robin [name]. Robin [name] added her "church" icon over the weekend with the assist of her evening caregiver. SLP asked "Wh-" questions (what is the month, next holiday, caregiver names, etc) and Pt cued to use the Lingraphica device to respond. She is more efficient with the white board feature (drawing) over the keyboard and can quickly write numbers and then use that to prompt oral production. She answered the questions with 80% acc with min assist. She requested the "News" section on the device and selected an article about Robin [name] to listen/read and answered comprehension questions about the article with 100% acc when provided min cues. She benefits from having the device read aloud the text, the questions, and the multiple choice responses. We added new icons to the "current events" folder to include upcoming holidays and plans. For homework, she will review the "News" section and answer the assigned questions. Would like to have [name] sister attend another session to facilitate carryover of using the device with all family members.                                                                                                       DATE: 06/23/23  CLINICAL IMPRESSION: (from initial evaluation on 03/19/2023) Patient is a 57 y.o. female who was seen today for speech/language evaluation s/p CVA in May of 2021 with only limited follow up SLP therapy via home health in 2021 when she was staying with her daughter. [name] sister accompanied her to the evaluation and would like for her sister to be able to communicate her wants and needs verbally. 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 Pt has difficulty verbally labeling high frequency objects (but was able to write some responses), repeating longer words/phrases, and reading phrases. She was able to match 5/5 high frequency objects to single words, but was only able to orally read/label 1/5 words. Pt benefited from gesturing to communicate her intent and this occasionally helped elicit word production and she also benefited from SLP phonemic cues and single word sentence completion cues. Recommend SLP therapy to address communication deficits and increase independence with communication 2x/week for 4-8 weeks.  OBJECTIVE IMPAIRMENTS: include executive functioning, expressive language, receptive language, aphasia, and apraxia. These impairments are limiting patient from managing medications, managing appointments, managing finances, and effectively communicating at home and in community. Factors affecting potential to achieve goals and functional outcome are ability to learn/carryover information, severity of impairments, and time post onset of aphasia (2021) . Patient will benefit from skilled SLP services to address above impairments and improve overall function.  REHAB POTENTIAL: Good  PLAN:  SLP FREQUENCY: 2x/week  SLP DURATION: 8 weeks  PLANNED INTERVENTIONS: Cueing hierachy, Internal/external aids, Functional tasks, Multimodal communication approach, SLP instruction and feedback, Compensatory strategies, Patient/family education, and Re-evaluation    Thank you,  Robin [name], CCC-SLP [phone]  Robin [name], CCC-SLP 06/23/2023, 10:20 AM

## 2023-06-25 ENCOUNTER — Encounter (HOSPITAL_COMMUNITY): Payer: Self-pay | Admitting: Speech Pathology

## 2023-06-25 ENCOUNTER — Ambulatory Visit (HOSPITAL_COMMUNITY): Payer: 59 | Admitting: Speech Pathology

## 2023-06-25 DIAGNOSIS — R41841 Cognitive communication deficit: Secondary | ICD-10-CM

## 2023-06-25 DIAGNOSIS — R4701 Aphasia: Secondary | ICD-10-CM

## 2023-06-25 DIAGNOSIS — I69890 Apraxia following other cerebrovascular disease: Secondary | ICD-10-CM

## 2023-06-25 NOTE — Therapy (Signed)
OUTPATIENT SPEECH LANGUAGE PATHOLOGY TREATMENT   Patient Name: Robin [name] MRN: [id number] DOB:10/30/1965, 57 y.o., female Today's Date: 06/25/2023  PCP: Robin Motto, PA REFERRING PROVIDER: Jarold Motto, PA  END OF SESSION:  End of Session - 06/25/23 0936     Visit Number 20    Number of Visits 24    Date for SLP Re-Evaluation 07/03/23    Authorization Type UHC Medicare and Medicaid secondary; Dual complete   eff 08/05/22 ded 240 met oop 8850 met 2034.20 limit-no auth-no co ins-20%   SLP Start Time 0934    SLP Stop Time  1019    SLP Time Calculation (min) 45 min    Activity Tolerance Patient tolerated treatment well             Past Medical History:  Diagnosis Date   A-fib (HCC)    Abdominal aortic aneurysm (AAA) (HCC)    Allergy    Anemia    Arthritis    Atrial fibrillation (HCC)    CHF (congestive heart failure) (HCC)    a. EF at 30-35% by echo in 08/2018 b. EF at 35% by repeat echo in 05/2019 c. EF improved to 65-70% in 2021 d. EF at 40-45% in 08/2021.   Chronic abdominal pain    Chronic atrial fibrillation (HCC)    Cocaine abuse (HCC)    COPD (chronic obstructive pulmonary disease) (HCC)    Diabetes mellitus without complication (HCC)    Essential hypertension, benign    Expressive aphasia    Expressive aphasia    post CVA   Fatty liver    GERD (gastroesophageal reflux disease)    Gout 2016   Normal coronary arteries    3/10 - following abnormal Myoview   Ovarian cyst    Stroke (HCC) 12/26/2019   Right sided weakness, and expressive aphasia   Stroke [facility])    Thoracic ascending aortic aneurysm (HCC)    4 cm 10/31/19 CTA   Type 2 diabetes mellitus (HCC)    type II   Past Surgical History:  Procedure Laterality Date   ABDOMINAL HYSTERECTOMY  09/10/2011   Procedure: HYSTERECTOMY ABDOMINAL;  Surgeon: Robin Burrow, MD;  Location: [facility];  Service: Gynecology;  Laterality: N/A;  Abdominal hysterectomy   CESAREAN SECTION  [phone], and 1994    CHOLECYSTECTOMY  1995   IR 3D INDEPENDENT WKST  03/16/2020   IR ANGIO INTRA EXTRACRAN SEL INTERNAL CAROTID BILAT MOD SED  03/16/2020   IR ANGIO VERTEBRAL SEL SUBCLAVIAN INNOMINATE UNI L MOD SED  03/16/2020   IR ANGIO VERTEBRAL SEL VERTEBRAL UNI R MOD SED  03/16/2020   IR CT HEAD LTD  12/27/2019   IR PERCUTANEOUS ART THROMBECTOMY/INFUSION INTRACRANIAL INC DIAG ANGIO  12/27/2019   IR US GUIDE VASC ACCESS RIGHT  12/27/2019   IR US GUIDE VASC ACCESS RIGHT  03/16/2020   RADIOLOGY WITH ANESTHESIA N/A 12/27/2019   Procedure: IR WITH ANESTHESIA;  Surgeon: Robin Cotton, MD;  Location: [facility];  Service: Radiology;  Laterality: N/A;   SCAR REVISION  09/10/2011   Procedure: SCAR REVISION;  Surgeon: Robin Burrow, MD;  Location: [facility];  Service: Gynecology;  Laterality: N/A;  Wide Excision of old Cicatrix   TUBAL LIGATION  1994   Patient Active Problem List   Diagnosis Date Noted   Acute on chronic systolic CHF (congestive heart failure) (HCC) 11/27/2021   Stage 3b chronic kidney disease (CKD) (HCC) 11/27/2021   Microcytic anemia 11/27/2021   Lactic acidosis 11/27/2021  Diabetes mellitus (HCC) 03/09/2021   Type 2 diabetes mellitus with stage 3a chronic kidney disease, with long-term current use of insulin (HCC) 03/09/2021   Acute metabolic encephalopathy 12/31/2020   Hyperglycemia 12/31/2020   Prolonged QT interval 05/12/2020   History of CVA with residual deficit 05/12/2020   Atrial fibrillation, chronic (HCC) 05/12/2020   Right hemiparesis (HCC)    Essential hypertension    Chronic diastolic congestive heart failure (HCC)    Dysphagia due to recent stroke 01/05/2020   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 01/05/2020   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 12/27/2019   Thoracic aortic aneurysm without rupture (HCC)    Atrial fibrillation with RVR (HCC) 05/10/2019   Type 2 diabetes mellitus with diabetic autonomic neuropathy, with long-term current use of insulin  (HCC) 09/01/2018   Cocaine abuse (HCC) 08/25/2018   Cigarette nicotine dependence, uncomplicated 06/07/2015   GERD (gastroesophageal reflux disease) 06/07/2011   COPD (chronic obstructive pulmonary disease) (HCC) 06/07/2011   Arthritis 06/07/2011    ONSET DATE: May 2021   REFERRING DIAG: R47.01 (ICD-10-CM) - Expressive aphasia  THERAPY DIAG:  Aphasia  Apraxia following other cerebrovascular disease  Cognitive communication deficit  Rationale for Evaluation and Treatment: Rehabilitation  SUBJECTIVE:   SUBJECTIVE STATEMENT: "That is beautiful."  Pt accompanied by: self and family member  PERTINENT HISTORY: Robin [name] is a pleasant 57 y.o. female with medical history significant for atrial fibrillation on Eliquis, chronic systolic CHF, CKD 3B, insulin-dependent diabetes mellitus, polysubstance abuse, COPD, GERD, HTN, and history of CVA with residual right-sided weakness and aphasia (L MCA CVA and L ICA occlusion s/p thrombectomy on 12/27/2019 followed by CIR stay where she received ST/OT/PT until d/c home on 02/02/20 and then received some [facility] SLP services). She is referred for outpatient SLP therapy by Robin Motto, PA to facilitate increased independence with communication.  PAIN:  Are you having pain? No  PATIENT GOALS: Increase ability to talk  OBJECTIVE:   DIAGNOSTIC FINDINGS:  MRI 11/13/2020: IMPRESSION: No evidence of acute intracranial abnormality, including acute infarction.   Redemonstrated large chronic left MCA territory infarct.   Redemonstrated small chronic cortically based infarct within the posterior right temporal lobe.   Mild bilateral ethmoid sinus mucosal thickening.  PATIENT EDUCATION: Education details: Robin [name] educated: Patient and sister, Robin [name] Education method: Explanation and Handouts Education comprehension: verbalized understanding and needs further education   GOALS: Goals reviewed with patient?  Yes  SHORT TERM GOALS: Target date: 07/10/2023  Pt will increase verbal naming of common objects/pictures to 75% acc when provided with mod multimodality cues Baseline: 80% when allowed to write response or provided mod/max cues Goal status: MET for 100% acc for mod/max cues (phonemic cue provided); change to 100% acc with mod cues; met   2.  Pt will describe objects and pictures by providing at least three salient features (single words ok) as judged by clinician with 80% acc when provided mod cues. Baseline: 70% mod/max cues Goal status: MET for 100% with mod/max cues provided; change to 100% acc with mod cues; met   3.  Pt will complete single word sentence completion tasks [facility]) with 90% acc when provided with min/mod multimodality cues.  Baseline: 75% mod cues of initial phoneme modeled Goal status: MET for 100% acc with mi/mod cues provided; change to 100% with min cues; Partially met, d/c  4.  Pt will complete basic level reading comprehension tasks (picture cues for sentence level) with 80% acc with  provided mi/mod cues. Baseline: 100% word to picture matching f=5, 0/5 sentences Goal status: MET for single words, continue for sentences; Partially Met, use on device  5.  Pt will verbally describe action photos with use of "who" "what doing" template with 80% acc and min/mod cues.  Baseline: 70% mod cues. Goal status: MET for 100% acc with mod/max cues; change to 100% with mod cues; Met  6.  Pt will complete personally relevant responsive naming tasks with 90% acc with use of written cues as needed. Baseline: 75% acc Goal status: MET  7. Pt will use a communication device to cue themselves to speak (reading or imitating words) during 1:1 conversations with SLP with min/mod cues over 3 consecutive sessions.             Baseline: Max assist             Goal status: ONGOING   8. Pt will respond to personal preference questions by answering with alternative communication device  with 90% acc and min assist.             Baseline: Max assist             Goal status: ONGOING   9. Pt will use alternative means of communication (low tech and high tech) to maximize independence with communication and find a system that is most efficient for her.             Baseline: Trial of Lingraphica, Spoken App, letter board, etc             Goal status: ONGOING   10. Pt will use alternative means of communication to locate list of medications, health symptoms, and personal/bio information with indirect cues.             Baseline: mod assist             Goal status: ONGOING  LONG TERM GOALS: Target date: 07/25/2023  Pt will communicate moderately complex wants/needs to [facility] with min assist from communication partner and use of multimodality communication strategies. Baseline: Pt requires mod assist from communication partner  Goal status: ONGOING   ASSESSMENT:  PREVIOUS TREATMENT:     03/26/23: Pt accompanied to therapy this date by her caregiver, Agustin Cree and her sister. They brought in a notebook and completed a good portion of the personalized communication template. SLP reviewed the information with Pt and Pt asked to read names of family members. Pt able to read 75% of the names and benefited from cues to look away/provide diversion and return to task when she could not read it right away. Family provided education on ways to help her with this at home. She was able to write the letters with her finger on the table 90% of the time and even orally spell when unable to say the word. Pt completed automatic speech tasks with 80% acc, but needed cues to decrease rate to increase accuracy of responses. Goals and evaluation were reviewed. Pt smiling and engaged throughout the session. She occasionally stated, "I can't do that", but with encouragement she tried to complete the request. Her sister commented that she seems to be able to communicate with some family members better than others  and SLP suggested that she ascertain the content of conversation (ie. Harder to talk about personal needs with sister than lighter conversation with an aunt). Target above goals next session.   04/03/2023: Pt accompanied by caregiver, Agustin Cree and brought in her notebook. Pt completed confrontation  naming task with 50% acc without cues and increased to 90% acc with cues (Pt able to use letter board to spell the word 90% of the time). She completed single word sentence completion (SWSC) task by providing accurate verbal response when looking at pictures with 80% acc with mi/mod cues. She was then asked to repeat the word x3 and SLP interjected with confounds at times and Pt asked to return to word. This was very challenging for Pt with performance improved throughout trials. She was able to provide a gesture for each work with 100% acc. She answered responsive naming questions with use of personal communication template as needed with 95% acc. SLP provided strategies for accurate number production by having Pt count on her fingers and show the number on her hands and then verbalize the number, which appeared effective. Introduce the "Say it" set list of words next session.   04/09/2023: Pt accompanied by her caregiver, Darlene. Pt indicated that she went to the ED yesterday due to chest pain and nose bleed. She currently feels much better. She completed confrontation naming of high frequency words with 50% acc, single word sentence completion task with 100% acc with min cues, responsive naming with 90% and mod cues, and elicited appropriate gesture for words with 100% acc with min assist. She was cued to use her personalized communication template to help find answers to responsive naming tasks. During confrontation naming tasks, she benefited from being able to write the word, but had difficulty saying the word without phonemic and sentence cues. New words were added to her communication template (purse, keys,  phone, water, etc). Continue to target goals above and focus on responsive naming tasks for personal information.     04/14/2023: Pt accompanied by her caregiver, Darlene. She verbalized that she went to [place] over the weekend to see friends/family. She completed responsive naming tasks in conversation regarding personally relevant information with 90% acc with ~75% acc for verbal responses and 100% when allowed to write or use written cues. She completed confrontation naming task when looking at icons on a tablet with 70% acc with mod cues, but was able to repeat the name of the item after model provided, with 90% acc. She was introduced to the [place] tablet today and she expressed interest in using it to help her communicate. She indicates that she thinks she has something similar at home and will bring in next session. She was able to quickly point to 7 fruits that she likes to eat and was able to repeat 6/7 after model provided. Update goals next session.       04/17/23:  Pt was accompanied by a new caregiver today because Darlene had an appointment. Pt was able to communicate this to SLP with use of written personalized template to identify the name "Darlene". Harriett Sine answered personally relevant responsive naming questions (regarding names of friends, family, and medical professionals) with 100% acc with use of communication support as needed and accurate verbal response 90% of the time with min cues. Pt verbalized a subject, verb/action, and object when looking at action photos with 92% acc with mi/mod cues and was then able to repeat with written model provided with 100% acc with min assist. She then completed single word sentence completion task with 100% acc with min cues. Shelene occasionally shuts down when a task feels out of her reach or too challenging and needs encouragement to try the task. She was trying to verbalize "smelling" and produced "smoking" instead,  but was able to break the  perseveration and produce "smelling" on ~70% of cued trials. She was given divergent naming task with word bank provided for homework and completed trial x 2 before ending the session. Next session, continue to expansive naming tasks to generate sentences when looking at photos.   04/23/2023: Pt accompanied to therapy by Darlene. She completed responsive naming task by answering personally relevant questions with use of written cues provided with 100% acc. She is unable to verbalize responses independently without use of written cues or initial phoneme cue from SLP. She named common objects with 100% acc when allowed to write her response or when given initial phoneme cue and she completed single word sentence completion tasks with 100% acc with provided mi/mod cues. She independently uses total communication strategies by gesturing, writing, or looking for written response when she is unable to verbalize her response. Unfortunately, she needs environmental supports via communication partner, written templates, gestures, and ability to hand write responses to communicate her thoughts. Pt would make an excellent candidate for a dynamic communication system. We have tried some apps on her phone, however she would benefit from a dedicated speech generating device. Goals have been updated and will request a trial of a communication device. Pt is in agreement with plan of care. Requesting 2x/week for 4 more weeks.   04/28/23: Pt accompanied by a different caregiver today and she was able to locate her name by finding it in her contacts on her phone. She completed responsive naming tasks with 100% acc with use of communication template, described action pictures using subject and verb with 100% acc with mod cues, completed SWSC tasks with 100% acc with min cues, confrontation naming tasks for high frequency words with 100% acc with mod/max cues. She was able to repeat 5 word sentences with 100% acc when provided mi/mod  cues from SLP. SLP submitted request for AAC device trials and Pt expressed excitement over having a more efficient way to help her communicate. Continue to target goals next session.   04/30/2023: SLP submitted request to check insurance benefits for alternative communication device, awaiting response. Pt completed HEP. She used verbal responses, letter board, communication template, and self written responses to answer personally relevant responsive naming tasks with 94% acc with mi/mod use of non-verbal responses. She completed confrontation naming of high frequency pictures with 100% acc with mod cues. Pt expressed that mentally fatiguing for her to verbalize responses at times. SLP provided reassurance and positive reinforcement toward Pt gains this session. She continues to be motivated and uses alternative communication strategies (even able to spell words aloud when unable to verbalize the word) with indirect cues. Continue targeting goals and trials of AAC devices.   05/21/2023: Pt unable to attend therapy in the last few weeks due to scheduling conflicts with her ability to obtain transportation. She was accompanied to today's visit by her sister and her new caregiver. SLP introduced the [place] communication device today. Pt able to turn the device on and select icons with cues from SLP (orientation factors and the specific folders). SLP opened the page to breakfast items and Pt selected 10 items she enjoys eating and was encouraged to use the device to cue herself to speak if unable to verbalize the word. She assisted SLP in adding personally relevant information to her communication template that she would like to have on the device. Her schedule has been updated and she will be able to attend more consistently and expressed  feeling positive about using a speech generating device to assist with communication. Device will need further personalization before we begin a trial.   05/26/2023: Pt  accompanied to therapy by her new caregiver. She communicated to me that her son was shot and is in the hospital. SLP showed Pt how to add icons and folders to the [place] device. Pt requested that we add "[facility]" and was able to then return demonstrate navigation of the device to select the icon and use it to prompt verbal production from her. She also identified two Gospel singers she likes and we added those icons. She was encouraged to verbalize her responses first when answering Wh-questions and then use the device to prompt speech. She verbalized that her son was "63" years old, however when she was prompted to write the age on the dry erase board feature, she wrote his correct age. Samah can often write the word she wants to say when she is unable to verbalize it. She was encouraged to use the camera feature at home to take photos to add to the [employer]. Continue with device navigation and personalization next session.   05/28/2023: Pt used the Lingraphica device at home by completing verbal expression activities in therapy section.  SLP facilitated session by adding additional icons and folders for Pt to express herself in session and with her caregivers at home. She was able to select food choices and restaurants with min assist once opened to the page. SLP cued the Pt to use the device to cue herself to speak (model from device and then attempt to repeat). She expressed that she would like to be able to vote, but didn't think that she was able to. SLP encouraged Pt to have someone from her family to take her to vote and she can request assist as needed. Pt smiled throughout the session and used multi-modality systems to communicate with SLP (gestures, verbal response, AAC device, written responses, and occasionally letter board). Pt continues to be motivated and shows excellent ability to use an AAC. Continue with trials and goals have been updated, cert request.  06/02/2023:  Pt arrived on [facility] without her caregiver this AM and she appeared pleased that she was able to accomplish this on her own. A new caregiver, Raechel Ache, arrived later on in our treatment session. SLP encouraged Pt to introduce herself to the new caregiver and use the device as needed to supplement. Pt required mod cues via offering binary choices (did you have a stroke or a heart attack?) to communicate to caregiver. Harriett Sine added photos to the [place] device and SLP facilitated adding these to her "people" page and needed to rotate pictures etc. SLP created a flow sheet for Pt to follow to learn how to add icons and move to different folders. Pt was able to return demonstrate with moderate cueing. She benefits from repetition of task and was encouraged to practice this at home, while fresh on her mind. She was able to answer responsive naming questions with use of device and verbal response with ~85% acc with min cues for device navigation. Next session, SLP will ask Pt to locate recently added icons to answer responsive naming questions and add a page for medical information.           06/05/2023: [facility] did not pick Pt up for her appointment this AM, but we were able to reschedule her for a later time today. She was cued to refrain from pointing/gesturing when she  wanted an action (pick up her bag, bring her phone, order food) and to verbalize first. She was able to produce single words to convey meaning. Session focused on navigation of Lingraphica device and adding new icons to report symptoms. Jazlynn was able to turn on the device, locate the "people" tab and relay who her medical team and caregivers included. She identified three pain descriptors she wanted added: left shoulder pain, bilateral knee pain, and left toe pain. SLP also added stroke related information. We had difficulty copying and moving icons to other folders, so SLP will reach out to Lingraphica to facilitate this. Pt is more efficiently able to  use the Lingraphica device over the letter board and written template, however SLP continues to encourage Pt to use all methods of communication at this time. Continue plan of care and address goals as stated above.   06/09/2023: Marlii was accompanied by her caregiver, Mae. She was able to turn on the Lingraphica device and navigate to specific folders with indirect cues. She added three phots of items in her home: blender, TV, and her bed. We created an icon for blender and placed in the "kitchen" folder. She was able to then locate the icon, but it did require about 5 steps to arrive to location. She answered personally relevant responsive naming questions with use of device. When SLP asked if she voted, she said yes and that her "Cathren Harsh" took her, however SLP then cued her to use the device to locate the icon of who took her. She then accurately identified her sister. Once SLP assisted with navigation to the restaurants page, she was able to select the placed she goes to and got rid of the ones she does not. Under the [employer] icon, we changed it to a folder and she added: fish sandwich, fries, and sweet tea. She navigated to the medical information page and showed current body aches. Continue to have Pt navigate the device and communicate more complex ideas.  06/11/2023: Rona was accompanied by her caregiver, Mae. Pt and caregiver added some restaurants into the [place] device, however it was discovered that there are two "restaurant" folders and they are different. Together, we merged the two folders into one. She was able to navigate to specific folders with min cues. She was shown how to follow the listed word sheet at the top to help orient her within the device. We had some trouble copying and moving folders so I emailed Lingraphica to assist with this. She was reminded to not only select the icon, but to try to use it to prompt her to say what the icon represents. She is able to do this with single  word responses better than a full sentence. She is becoming more adept with device navigation and was encouraged to add some more photos over the weekend. Continue with device personalization and navigation.   06/16/2023: Pt added a new caregiver with name and photo over the weekend and placed it in the correct location. She was able to navigate the device and show me what she added. Pt cued to use the device to answer "wh" questions related to favorites, family, and current events and she answered with 95% acc with min assist for more efficient navigation. She often had to look in a few folders to find the information she wanted. We added some phrases and sentences to the "Fast Talk" folder and she was then able to use each phrase appropriately with min prompt (find the icon  to request that transportation service be called to pick you up". We also navigated to the "Therapy" folder and she completed spelling of pictured objects when given the three letters to place in order (95%). She was unable to imitate/repeat the phrases from the video recorded section, so we can try this again next time. Pt pointed to a list in her notebook of medications and phone numbers that she indicated she wanted to place in the [place]. SLP will pursue completion of evaluation form, as Pt has verbally expressed that she would like to obtain a speaking device for herself.   06/18/23: Pt reported that someone from [employer] called her sister. Pt assisted in completing the assignment of benefits portion online. SLP requested specific biographical information from Pt and she used her notebook, Lingraphica, and gestures to communicate. SLP cued Pt to first try to verbalize her responses and also to use the Lingraphica to prompt a verbal response (when used as a model). She pointed to information written in her notebook that she wanted transferred into her speech generating device. Once information was added, Pt navigated to the  newly added information with min cues from SLP. Pt will need ongoing repetition to locate all the information in an efficient manner. Continue with device navigation and have Pt introduce topics to discuss.   06/23/2023: Pt accompanied to therapy by her caregiver, Mae. Aliyanah added her "church" icon over the weekend with the assist of her evening caregiver. SLP asked "Wh-" questions (what is the month, next holiday, caregiver names, etc) and Pt cued to use the Lingraphica device to respond. She is more efficient with the white board feature (drawing) over the keyboard and can quickly write numbers and then use that to prompt oral production. She answered the questions with 80% acc with min assist. She requested the "News" section on the device and selected an article about Weyman Croon to listen/read and answered comprehension questions about the article with 100% acc when provided min cues. She benefits from having the device read aloud the text, the questions, and the multiple choice responses. We added new icons to the "current events" folder to include upcoming holidays and plans. For homework, she will review the "News" section and answer the assigned questions. Would like to have [name] sister attend another session to facilitate carryover of using the device with all family members.   CURRENT TREATMENT:  Pt accompanied to therapy by Mae. Dreah indicated that she used the "News" section on the Lingraphica to listen to current events and answered questions. This session was spent of further device personalization and navigation. She is having trouble finding where certain folders and icons are so I created a written flow chart for her to follow (Start>Going out>Place>Restaurant). She was able to follow this with min cues. She initiated a topic today when looking at the "Hobbies" folder and selected "Gardening". She then selected which plants and flowers she wanted to add when shown picture options from SLP.  She was able to create an icon and find a photo from a Programmer, multimedia with use of the device with indirect cues (after initial modeling). Haasini is becoming more independent in using the device, but would benefit from further personalization and identify more efficient ways of accessing some icons. We are awaiting approval for her own Lingraphica. Continue using the device in sessions to increase efficiency and independence.  DATE: 06/25/23  CLINICAL IMPRESSION: (from initial evaluation on 03/19/2023) Patient is a 57 y.o. female who was seen today for speech/language evaluation s/p CVA in May of 2021 with only limited follow up SLP therapy via home health in 2021 when she was staying with her daughter. [name] sister accompanied her to the evaluation and would like for her sister to be able to communicate her wants and needs verbally. 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 Pt has difficulty verbally labeling high frequency objects (but was able to write some responses), repeating longer words/phrases, and reading phrases. She was able to match 5/5 high frequency objects to single words, but was only able to orally read/label 1/5 words. Pt benefited from gesturing to communicate her intent and this occasionally helped elicit word production and she also benefited from SLP phonemic cues and single word sentence completion cues. Recommend SLP therapy to address communication deficits and increase independence with communication 2x/week for 4-8 weeks.  OBJECTIVE IMPAIRMENTS: include  executive functioning, expressive language, receptive language, aphasia, and apraxia. These impairments are limiting patient from managing medications, managing appointments, managing finances, and effectively communicating at home and in community. Factors affecting potential to achieve goals and functional outcome are ability to learn/carryover information, severity of impairments, and time post onset of aphasia (2021) . Patient will benefit from skilled SLP services to address above impairments and improve overall function.  REHAB POTENTIAL: Good  PLAN:  SLP FREQUENCY: 2x/week  SLP DURATION: 8 weeks  PLANNED INTERVENTIONS: Cueing hierachy, Internal/external aids, Functional tasks, Multimodal communication approach, SLP instruction and feedback, Compensatory strategies, Patient/family education, and Re-evaluation    Thank you,  Havery Moros, CCC-SLP [phone]  Kemoni Quesenberry, CCC-SLP 06/25/2023, 9:36 AM

## 2023-06-28 ENCOUNTER — Other Ambulatory Visit: Payer: Self-pay | Admitting: Physician Assistant

## 2023-06-28 DIAGNOSIS — I10 Essential (primary) hypertension: Secondary | ICD-10-CM

## 2023-06-30 ENCOUNTER — Encounter: Payer: Self-pay | Admitting: Nurse Practitioner

## 2023-06-30 ENCOUNTER — Ambulatory Visit (HOSPITAL_COMMUNITY): Payer: 59 | Admitting: Speech Pathology

## 2023-06-30 ENCOUNTER — Encounter (HOSPITAL_COMMUNITY): Payer: Self-pay | Admitting: Speech Pathology

## 2023-06-30 ENCOUNTER — Ambulatory Visit: Payer: 59 | Attending: Nurse Practitioner | Admitting: Nurse Practitioner

## 2023-06-30 VITALS — BP 108/62 | HR 86 | Ht 67.0 in | Wt 242.0 lb

## 2023-06-30 DIAGNOSIS — N1832 Chronic kidney disease, stage 3b: Secondary | ICD-10-CM

## 2023-06-30 DIAGNOSIS — R079 Chest pain, unspecified: Secondary | ICD-10-CM

## 2023-06-30 DIAGNOSIS — I10 Essential (primary) hypertension: Secondary | ICD-10-CM | POA: Diagnosis not present

## 2023-06-30 DIAGNOSIS — I428 Other cardiomyopathies: Secondary | ICD-10-CM

## 2023-06-30 DIAGNOSIS — R4701 Aphasia: Secondary | ICD-10-CM | POA: Diagnosis not present

## 2023-06-30 DIAGNOSIS — I5022 Chronic systolic (congestive) heart failure: Secondary | ICD-10-CM | POA: Diagnosis not present

## 2023-06-30 DIAGNOSIS — R41841 Cognitive communication deficit: Secondary | ICD-10-CM

## 2023-06-30 DIAGNOSIS — I7121 Aneurysm of the ascending aorta, without rupture: Secondary | ICD-10-CM

## 2023-06-30 DIAGNOSIS — I69890 Apraxia following other cerebrovascular disease: Secondary | ICD-10-CM

## 2023-06-30 DIAGNOSIS — I4821 Permanent atrial fibrillation: Secondary | ICD-10-CM | POA: Diagnosis not present

## 2023-06-30 NOTE — Patient Instructions (Signed)
Medication Instructions:  Your physician recommends that you continue on your current medications as directed. Please refer to the Current Medication list given to you today.  *If you need a refill on your cardiac medications before your next appointment, please call your pharmacy*   Lab Work: None If you have labs (blood work) drawn today and your tests are completely normal, you will receive your results only by: MyChart Message (if you have MyChart) OR A paper copy in the mail If you have any lab test that is abnormal or we need to change your treatment, we will call you to review the results.   Testing/Procedures: None   Follow-Up: At [facility], you and your health needs are our priority.  As part of our continuing mission to provide you with exceptional heart care, we have created designated Provider Care Teams.  These Care Teams include your primary Cardiologist (physician) and Advanced Practice Providers (APPs -  Physician Assistants and Nurse Practitioners) who all work together to provide you with the care you need, when you need it.  We recommend signing up for the patient portal called "MyChart".  Sign up information is provided on this After Visit Summary.  MyChart is used to connect with patients for Virtual Visits (Telemedicine).  Patients are able to view lab/test results, encounter notes, upcoming appointments, etc.  Non-urgent messages can be sent to your provider as well.   To learn more about what you can do with MyChart, go to [email or web address].    Your next appointment:   6 month(s)  Provider:   You may see Branch, Jonathan, MD or one of the following Advanced Practice Providers on your designated Care Team:   Brittany Strader, PA-C  Michele Lenze, PA-C     Other Instructions    

## 2023-06-30 NOTE — Progress Notes (Signed)
Office Visit    Patient Name: Robin [name] Date of Encounter: 06/30/2023  Primary Care Provider:  Jarold Motto, PA Primary Cardiologist:  Robin Rich, MD  Chief Complaint    57 y.o. female with a history of heart failure with reduced ejection fraction, nonischemic cardiomyopathy, persistent atrial fibrillation, polysubstance abuse, hypertension, diabetes, COPD, aortic stenosis, thoracic aortic aneurysm, and stroke, who presents for f/u related to HFrEF.  Past Medical History  Subjective   Past Medical History:  Diagnosis Date   Allergy    Anemia    Aortic stenosis    a. 02/2023 Echo: mild-mod AS.   Arthritis    Chronic abdominal pain    Chronic HFrEF (heart failure with reduced ejection fraction) (HCC)    a. EF at 30-35% by echo in 08/2018 b. EF at 35% by repeat echo in 05/2019 c. EF improved to 65-70% in 2021 d. EF at 40-45% in 08/2021; e. 02/2023 Echo: 40-45%, glob HK, mod LVH, nl RV size/fx, sev BAE, mild-mod AS.   Cocaine abuse (HCC)    COPD (chronic obstructive pulmonary disease) (HCC)    Diabetes mellitus without complication (HCC)    Essential hypertension, benign    Expressive aphasia    post CVA   Fatty liver    GERD (gastroesophageal reflux disease)    Gout 2016   Normal coronary arteries    a. 10/2008 abnl MV; b. 10/2008 Cath: nl cors.   Ovarian cyst    Permanent atrial fibrillation (HCC)    Stroke (HCC) 12/26/2019   Right sided weakness, and expressive aphasia   Thoracic ascending aortic aneurysm (HCC)    a. 4 cm 10/31/19 CTA; b. 02/2023 Echo: Nl Ao root.   Type 2 diabetes mellitus (HCC)    type II   Past Surgical History:  Procedure Laterality Date   ABDOMINAL HYSTERECTOMY  09/10/2011   Procedure: HYSTERECTOMY ABDOMINAL;  Surgeon: Robin Burrow, MD;  Location: [facility];  Service: Gynecology;  Laterality: N/A;  Abdominal hysterectomy   CESAREAN SECTION  [phone], and 1994   CHOLECYSTECTOMY  1995   IR 3D INDEPENDENT WKST  03/16/2020   IR ANGIO  INTRA EXTRACRAN SEL INTERNAL CAROTID BILAT MOD SED  03/16/2020   IR ANGIO VERTEBRAL SEL SUBCLAVIAN INNOMINATE UNI L MOD SED  03/16/2020   IR ANGIO VERTEBRAL SEL VERTEBRAL UNI R MOD SED  03/16/2020   IR CT HEAD LTD  12/27/2019   IR PERCUTANEOUS ART THROMBECTOMY/INFUSION INTRACRANIAL INC DIAG ANGIO  12/27/2019   IR US GUIDE VASC ACCESS RIGHT  12/27/2019   IR US GUIDE VASC ACCESS RIGHT  03/16/2020   RADIOLOGY WITH ANESTHESIA N/A 12/27/2019   Procedure: IR WITH ANESTHESIA;  Surgeon: Robin Cotton, MD;  Location: [facility];  Service: Radiology;  Laterality: N/A;   SCAR REVISION  09/10/2011   Procedure: SCAR REVISION;  Surgeon: Robin Burrow, MD;  Location: [facility];  Service: Gynecology;  Laterality: N/A;  Wide Excision of old Cicatrix   TUBAL LIGATION  1994    Allergies  Allergies  Allergen Reactions   Bee Venom Shortness Of Breath and Swelling    Bodily Swelling   Losartan Other (See Comments)    Nosebleeds per patient report.    Naproxen Other (See Comments)    Acid reflux   Penicillins Nausea Only   Lisinopril     Sinus congestion      History of Present Illness      57 y.o. y/o female with a history of heart failure  with reduced ejection fraction, nonischemic cardiomyopathy, persistent atrial fibrillation, polysubstance abuse, hypertension, diabetes, COPD, aortic stenosis, thoracic aortic aneurysm, and stroke.  She previous underwent diagnostic catheterization 2010 following abnormal stress test, catheterization showed normal coronary arteries.  A-fib was diagnosed in January 2020 in the setting of substance abuse.  This has become permanent, and she has been managed with carvedilol and Eliquis.  EF has been variable over the years, and was 30 to 35% by echo January 2020, with further reduction to 25-30% in April 2023 in the setting of rapid atrial fibrillation, and most recent echo in July 2024 showed an EF of 40 to 45% with global hypokinesis, and mild to moderate aortic stenosis.   Robin [name] was last seen in cardiology clinic in September 2024 following ED evaluation for chest pain and normal troponins.  She felt well at the time and a Myoview was ordered.  Today, patient's sister, who is present with her, says that because she was feeling well, they decided to forego further testing.  Patient and sister report that [name] has not had any recurrent chest pain or dyspnea.  She does very little in the way of activity though sometimes walks around the parking lot of her apartment complex with her caregiver and is able to do this with the assistance of a cane but otherwise without any symptoms.  She denies palpitations, PND, orthopnea, dizziness, syncope, edema, or early satiety. Objective  Home Medications    Current Outpatient Medications  Medication Sig Dispense Refill   Accu-Chek Softclix Lancets lancets Use as instructed 100 each 12   aspirin EC 81 MG tablet Take 1 tablet (81 mg total) by mouth daily. Swallow whole. 90 tablet 3   Blood Glucose Monitoring Suppl (ACCU-CHEK GUIDE ME) w/Device KIT 1 Device by Does not apply route 3 (three) times daily. 1 kit 0   carvedilol (COREG) 25 MG tablet TAKE 2 TABLETS BY MOUTH TWICE DAILY WITH A MEAL 360 tablet 1   cetaphil (CETAPHIL) lotion Apply 1 Application topically as needed for dry skin. 236 mL 0   dicyclomine (BENTYL) 20 MG tablet Take 1 tablet (20 mg total) by mouth 2 (two) times daily as needed for spasms. 20 tablet 0   Dulaglutide (TRULICITY) 3 MG/0.5ML SOPN Inject 3 mg as directed once a week. 6 mL 3   ELIQUIS 5 MG TABS tablet Take 1 tablet by mouth twice daily 180 tablet 0   empagliflozin (JARDIANCE) 25 MG TABS tablet Take 1 tablet (25 mg total) by mouth daily before breakfast. 90 tablet 3   Ferrous Sulfate (IRON) 325 (65 Fe) MG TABS Take 1 tablet by mouth once daily 90 tablet 0   furosemide (LASIX) 20 MG tablet TAKE 1 TABLET BY MOUTH ONCE DAILY (MAY TAKE ADDITIONAL 20 MG IF WEIGHT GAIN IS OVER 2LBS OVERNIGHT OR 5 LBS IN A  WEEK) 105 tablet 3   gabapentin (NEURONTIN) 100 MG capsule TAKE 2 CAPSULES BY MOUTH THREE TIMES DAILY 180 capsule 0   glucose blood (ACCU-CHEK GUIDE) test strip USE 1 STRIP TO CHECK GLUCOSE THREE TIMES DAILY 300 each 3   hydrALAZINE (APRESOLINE) 25 MG tablet TAKE 1 TABLET BY MOUTH THREE TIMES DAILY 270 tablet 0   insulin aspart (NOVOLOG FLEXPEN) 100 UNIT/ML FlexPen Inject 12 Units into the skin 3 (three) times daily with meals. 45 mL 3   insulin glargine (LANTUS SOLOSTAR) 100 UNIT/ML Solostar Pen Inject 44 Units into the skin daily. 45 mL 3   Insulin Pen Needle (  BD PEN NEEDLE NANO U/F) 32G X 4 MM MISC Inject 1 Device into the skin in the morning, at noon, in the evening, and at bedtime. 400 each 3   isosorbide mononitrate (IMDUR) 30 MG 24 hr tablet Take 2 tablets by mouth once daily 180 tablet 0   pantoprazole (PROTONIX) 40 MG tablet Take 1 tablet by mouth once daily 90 tablet 0   timolol (TIMOPTIC) 0.5 % ophthalmic solution Place 1 drop into both eyes 2 (two) times daily.     tobramycin (TOBREX) 0.3 % ophthalmic solution 1 drop 4 (four) times daily.     No current facility-administered medications for this visit.     Physical Exam    VS:  BP 108/62   Pulse 86   Ht 5\' 7"  (1.702 m)   Wt 242 lb (109.8 kg)   LMP 08/21/2011   SpO2 95%   BMI 37.90 kg/m  , BMI Body mass index is 37.9 kg/m.       GEN: Well nourished, well developed, in no acute distress. HEENT: normal. Neck: Supple, no JVD, carotid bruits, or masses. Cardiac: Irregularly irregular, 2/6 systolic ejection murmur loudest at the upper sternal borders, no rubs or gallops. No clubbing, cyanosis, edema.  Radials 2+/PT 2+ and equal bilaterally.  Respiratory:  Respirations regular and unlabored, clear to auscultation bilaterally. GI: Soft, nontender, nondistended, BS + x 4. MS: no deformity or atrophy. Skin: warm and dry, no rash. Neuro: Mildly aphasic. Psych: Flat affect.  Accessory Clinical Findings     Lab Results   Component Value Date   WBC 6.0 04/08/2023   HGB 11.1 (L) 04/08/2023   HCT 36.0 04/08/2023   MCV 87.0 04/08/2023   PLT 260 04/08/2023   Lab Results  Component Value Date   CREATININE 1.77 (H) 04/08/2023   BUN 23 (H) 04/08/2023   NA 136 04/08/2023   K 3.8 04/08/2023   CL 105 04/08/2023   CO2 23 04/08/2023   Lab Results  Component Value Date   ALT 13 01/26/2023   AST 14 (L) 01/26/2023   ALKPHOS 85 01/26/2023   BILITOT 0.4 01/26/2023   Lab Results  Component Value Date   CHOL 117 12/02/2022   HDL 38 (L) 12/02/2022   LDLCALC 61 12/02/2022   TRIG 97 12/02/2022   CHOLHDL 3.1 12/02/2022    Lab Results  Component Value Date   HGBA1C 8.8 (A) 05/09/2023   Lab Results  Component Value Date   TSH 1.90 12/02/2022       Assessment & Plan    1.  Chronic heart failure with reduced ejection fraction/heart failure with midrange ejection fraction/nonischemic cardiomyopathy: EF previously as low as 25 to 30% with slight improvement to 40-45% by echo in July 2024.  She has been doing well at home without dyspnea or edema.  She is euvolemic on examination today with stable heart rate and blood pressure.  She remains on beta-blocker, diuretic, dapagliflozin, hydralazine, and nitrate therapy.  No acei/arb/arni/mra in the setting of stage III chronic kidney disease and prior intolerance to acei/arb.  2.  Chest pain: Normal coronary arteries by catheterization in March 2010.  Patient seen in emergency department in September with chest pain with recommendation for outpatient Myoview.  Patient has been feeling well and opted to forego stress testing.  No recurrent chest pain or dyspnea.  Continue medical therapy-beta-blocker, aspirin, and nitrate.  3.  Permanent atrial fibrillation: Rate stable at 86 on beta-blocker therapy.  She is anticoagulated with Eliquis.  Stable lab work in September.  4.  Stage III chronic kidney disease: Creatinine 1.77 in September.  5.  Primary hypertension: Blood  pressure stable today on beta-blocker, hydralazine, nitrate, and diuretic therapy.  6.  Type 2 diabetes mellitus: A1c 8.8 in October.  Insulin, Trulicity, empagliflozin managed by primary care.  7.  History of stroke: Ongoing aphasia and weakness.  On Eliquis in the setting of A-fib.  Heart rate and blood pressure stable.  8.  Thoracic aortic aneurysm: 4 cm by CT in 2021 but more recent echo showed normal aorta.  9.  Disposition: Follow-up in 6 months or sooner if necessary.  Robin Ducking, NP 06/30/2023, 1:49 PM

## 2023-06-30 NOTE — Therapy (Signed)
OUTPATIENT SPEECH LANGUAGE PATHOLOGY TREATMENT   Patient Name: Robin [name] MRN: [id number] DOB:1966-03-26, 57 y.o., female Today's Date: 06/30/2023  PCP: Jarold Motto, PA REFERRING PROVIDER: Jarold Motto, PA  END OF SESSION:  End of Session - 06/30/23 0930     Visit Number 21    Number of Visits 24    Date for SLP Re-Evaluation 07/03/23    Authorization Type UHC Medicare and Medicaid secondary; Dual complete   eff 08/05/22 ded 240 met oop 8850 met 2034.20 limit-no auth-no co ins-20%   SLP Start Time 0930    SLP Stop Time  1015    SLP Time Calculation (min) 45 min    Activity Tolerance Patient tolerated treatment well             Past Medical History:  Diagnosis Date   A-fib (HCC)    Abdominal aortic aneurysm (AAA) (HCC)    Allergy    Anemia    Arthritis    Atrial fibrillation (HCC)    CHF (congestive heart failure) (HCC)    a. EF at 30-35% by echo in 08/2018 b. EF at 35% by repeat echo in 05/2019 c. EF improved to 65-70% in 2021 d. EF at 40-45% in 08/2021.   Chronic abdominal pain    Chronic atrial fibrillation (HCC)    Cocaine abuse (HCC)    COPD (chronic obstructive pulmonary disease) (HCC)    Diabetes mellitus without complication (HCC)    Essential hypertension, benign    Expressive aphasia    Expressive aphasia    post CVA   Fatty liver    GERD (gastroesophageal reflux disease)    Gout 2016   Normal coronary arteries    3/10 - following abnormal Myoview   Ovarian cyst    Stroke (HCC) 12/26/2019   Right sided weakness, and expressive aphasia   Stroke [facility])    Thoracic ascending aortic aneurysm (HCC)    4 cm 10/31/19 CTA   Type 2 diabetes mellitus (HCC)    type II   Past Surgical History:  Procedure Laterality Date   ABDOMINAL HYSTERECTOMY  09/10/2011   Procedure: HYSTERECTOMY ABDOMINAL;  Surgeon: Tilda Burrow, MD;  Location: [facility];  Service: Gynecology;  Laterality: N/A;  Abdominal hysterectomy   CESAREAN SECTION  [phone], and 1994    CHOLECYSTECTOMY  1995   IR 3D INDEPENDENT WKST  03/16/2020   IR ANGIO INTRA EXTRACRAN SEL INTERNAL CAROTID BILAT MOD SED  03/16/2020   IR ANGIO VERTEBRAL SEL SUBCLAVIAN INNOMINATE UNI L MOD SED  03/16/2020   IR ANGIO VERTEBRAL SEL VERTEBRAL UNI R MOD SED  03/16/2020   IR CT HEAD LTD  12/27/2019   IR PERCUTANEOUS ART THROMBECTOMY/INFUSION INTRACRANIAL INC DIAG ANGIO  12/27/2019   IR US GUIDE VASC ACCESS RIGHT  12/27/2019   IR US GUIDE VASC ACCESS RIGHT  03/16/2020   RADIOLOGY WITH ANESTHESIA N/A 12/27/2019   Procedure: IR WITH ANESTHESIA;  Surgeon: Julieanne Cotton, MD;  Location: [facility];  Service: Radiology;  Laterality: N/A;   SCAR REVISION  09/10/2011   Procedure: SCAR REVISION;  Surgeon: Tilda Burrow, MD;  Location: [facility];  Service: Gynecology;  Laterality: N/A;  Wide Excision of old Cicatrix   TUBAL LIGATION  1994   Patient Active Problem List   Diagnosis Date Noted   Acute on chronic systolic CHF (congestive heart failure) (HCC) 11/27/2021   Stage 3b chronic kidney disease (CKD) (HCC) 11/27/2021   Microcytic anemia 11/27/2021   Lactic acidosis 11/27/2021  Diabetes mellitus (HCC) 03/09/2021   Type 2 diabetes mellitus with stage 3a chronic kidney disease, with long-term current use of insulin (HCC) 03/09/2021   Acute metabolic encephalopathy 12/31/2020   Hyperglycemia 12/31/2020   Prolonged QT interval 05/12/2020   History of CVA with residual deficit 05/12/2020   Atrial fibrillation, chronic (HCC) 05/12/2020   Right hemiparesis (HCC)    Essential hypertension    Chronic diastolic congestive heart failure (HCC)    Dysphagia due to recent stroke 01/05/2020   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 01/05/2020   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 12/27/2019   Thoracic aortic aneurysm without rupture (HCC)    Atrial fibrillation with RVR (HCC) 05/10/2019   Type 2 diabetes mellitus with diabetic autonomic neuropathy, with long-term current use of insulin  (HCC) 09/01/2018   Cocaine abuse (HCC) 08/25/2018   Cigarette nicotine dependence, uncomplicated 06/07/2015   GERD (gastroesophageal reflux disease) 06/07/2011   COPD (chronic obstructive pulmonary disease) (HCC) 06/07/2011   Arthritis 06/07/2011    ONSET DATE: May 2021   REFERRING DIAG: R47.01 (ICD-10-CM) - Expressive aphasia  THERAPY DIAG:  Aphasia  Apraxia following other cerebrovascular disease  Cognitive communication deficit  Rationale for Evaluation and Treatment: Rehabilitation  SUBJECTIVE:   SUBJECTIVE STATEMENT: "What is that?"  Pt accompanied by: self and family member  PERTINENT HISTORY: Robin [name] is a pleasant 57 y.o. female with medical history significant for atrial fibrillation on Eliquis, chronic systolic CHF, CKD 3B, insulin-dependent diabetes mellitus, polysubstance abuse, COPD, GERD, HTN, and history of CVA with residual right-sided weakness and aphasia (L MCA CVA and L ICA occlusion s/p thrombectomy on 12/27/2019 followed by CIR stay where she received ST/OT/PT until d/c home on 02/02/20 and then received some [facility] SLP services). She is referred for outpatient SLP therapy by Jarold Motto, PA to facilitate increased independence with communication.  PAIN:  Are you having pain? No  PATIENT GOALS: Increase ability to talk  OBJECTIVE:   DIAGNOSTIC FINDINGS:  MRI 11/13/2020: IMPRESSION: No evidence of acute intracranial abnormality, including acute infarction.   Redemonstrated large chronic left MCA territory infarct.   Redemonstrated small chronic cortically based infarct within the posterior right temporal lobe.   Mild bilateral ethmoid sinus mucosal thickening.  PATIENT EDUCATION: Education details: Herbalist the [employer] educated: Patient and sister, [name] Education method: Explanation and Handouts Education comprehension: verbalized understanding and needs further education   GOALS: Goals reviewed with patient?  Yes  SHORT TERM GOALS: Target date: 07/10/2023  Pt will increase verbal naming of common objects/pictures to 75% acc when provided with mod multimodality cues Baseline: 80% when allowed to write response or provided mod/max cues Goal status: MET for 100% acc for mod/max cues (phonemic cue provided); change to 100% acc with mod cues; met   2.  Pt will describe objects and pictures by providing at least three salient features (single words ok) as judged by clinician with 80% acc when provided mod cues. Baseline: 70% mod/max cues Goal status: MET for 100% with mod/max cues provided; change to 100% acc with mod cues; met   3.  Pt will complete single word sentence completion tasks [facility]) with 90% acc when provided with min/mod multimodality cues.  Baseline: 75% mod cues of initial phoneme modeled Goal status: MET for 100% acc with mi/mod cues provided; change to 100% with min cues; Partially met, d/c  4.  Pt will complete basic level reading comprehension tasks (picture cues for sentence level) with 80% acc with  provided mi/mod cues. Baseline: 100% word to picture matching f=5, 0/5 sentences Goal status: MET for single words, continue for sentences; Partially Met, use on device  5.  Pt will verbally describe action photos with use of "who" "what doing" template with 80% acc and min/mod cues.  Baseline: 70% mod cues. Goal status: MET for 100% acc with mod/max cues; change to 100% with mod cues; Met  6.  Pt will complete personally relevant responsive naming tasks with 90% acc with use of written cues as needed. Baseline: 75% acc Goal status: MET  7. Pt will use a communication device to cue themselves to speak (reading or imitating words) during 1:1 conversations with SLP with min/mod cues over 3 consecutive sessions.             Baseline: Max assist             Goal status: ONGOING   8. Pt will respond to personal preference questions by answering with alternative communication device  with 90% acc and min assist.             Baseline: Max assist             Goal status: ONGOING   9. Pt will use alternative means of communication (low tech and high tech) to maximize independence with communication and find a system that is most efficient for her.             Baseline: Trial of Lingraphica, Spoken App, letter board, etc             Goal status: ONGOING   10. Pt will use alternative means of communication to locate list of medications, health symptoms, and personal/bio information with indirect cues.             Baseline: mod assist             Goal status: ONGOING  LONG TERM GOALS: Target date: 07/25/2023  Pt will communicate moderately complex wants/needs to [facility] with min assist from communication partner and use of multimodality communication strategies. Baseline: Pt requires mod assist from communication partner  Goal status: ONGOING   ASSESSMENT:  PREVIOUS TREATMENT:     03/26/23: Pt accompanied to therapy this date by her caregiver, Agustin Cree and her sister. They brought in a notebook and completed a good portion of the personalized communication template. SLP reviewed the information with Pt and Pt asked to read names of family members. Pt able to read 75% of the names and benefited from cues to look away/provide diversion and return to task when she could not read it right away. Family provided education on ways to help her with this at home. She was able to write the letters with her finger on the table 90% of the time and even orally spell when unable to say the word. Pt completed automatic speech tasks with 80% acc, but needed cues to decrease rate to increase accuracy of responses. Goals and evaluation were reviewed. Pt smiling and engaged throughout the session. She occasionally stated, "I can't do that", but with encouragement she tried to complete the request. Her sister commented that she seems to be able to communicate with some family members better than others  and SLP suggested that she ascertain the content of conversation (ie. Harder to talk about personal needs with sister than lighter conversation with an aunt). Target above goals next session.   04/03/2023: Pt accompanied by caregiver, Agustin Cree and brought in her notebook. Pt completed confrontation  naming task with 50% acc without cues and increased to 90% acc with cues (Pt able to use letter board to spell the word 90% of the time). She completed single word sentence completion (SWSC) task by providing accurate verbal response when looking at pictures with 80% acc with mi/mod cues. She was then asked to repeat the word x3 and SLP interjected with confounds at times and Pt asked to return to word. This was very challenging for Pt with performance improved throughout trials. She was able to provide a gesture for each work with 100% acc. She answered responsive naming questions with use of personal communication template as needed with 95% acc. SLP provided strategies for accurate number production by having Pt count on her fingers and show the number on her hands and then verbalize the number, which appeared effective. Introduce the "Say it" set list of words next session.   04/09/2023: Pt accompanied by her caregiver, Darlene. Pt indicated that she went to the ED yesterday due to chest pain and nose bleed. She currently feels much better. She completed confrontation naming of high frequency words with 50% acc, single word sentence completion task with 100% acc with min cues, responsive naming with 90% and mod cues, and elicited appropriate gesture for words with 100% acc with min assist. She was cued to use her personalized communication template to help find answers to responsive naming tasks. During confrontation naming tasks, she benefited from being able to write the word, but had difficulty saying the word without phonemic and sentence cues. New words were added to her communication template (purse, keys,  phone, water, etc). Continue to target goals above and focus on responsive naming tasks for personal information.     04/14/2023: Pt accompanied by her caregiver, Darlene. She verbalized that she went to [place] over the weekend to see friends/family. She completed responsive naming tasks in conversation regarding personally relevant information with 90% acc with ~75% acc for verbal responses and 100% when allowed to write or use written cues. She completed confrontation naming task when looking at icons on a tablet with 70% acc with mod cues, but was able to repeat the name of the item after model provided, with 90% acc. She was introduced to the [place] tablet today and she expressed interest in using it to help her communicate. She indicates that she thinks she has something similar at home and will bring in next session. She was able to quickly point to 7 fruits that she likes to eat and was able to repeat 6/7 after model provided. Update goals next session.       04/17/23:  Pt was accompanied by a new caregiver today because Darlene had an appointment. Pt was able to communicate this to SLP with use of written personalized template to identify the name "Darlene". Harriett Sine answered personally relevant responsive naming questions (regarding names of friends, family, and medical professionals) with 100% acc with use of communication support as needed and accurate verbal response 90% of the time with min cues. Pt verbalized a subject, verb/action, and object when looking at action photos with 92% acc with mi/mod cues and was then able to repeat with written model provided with 100% acc with min assist. She then completed single word sentence completion task with 100% acc with min cues. Aliece occasionally shuts down when a task feels out of her reach or too challenging and needs encouragement to try the task. She was trying to verbalize "smelling" and produced "smoking" instead,  but was able to break the  perseveration and produce "smelling" on ~70% of cued trials. She was given divergent naming task with word bank provided for homework and completed trial x 2 before ending the session. Next session, continue to expansive naming tasks to generate sentences when looking at photos.   04/23/2023: Pt accompanied to therapy by Darlene. She completed responsive naming task by answering personally relevant questions with use of written cues provided with 100% acc. She is unable to verbalize responses independently without use of written cues or initial phoneme cue from SLP. She named common objects with 100% acc when allowed to write her response or when given initial phoneme cue and she completed single word sentence completion tasks with 100% acc with provided mi/mod cues. She independently uses total communication strategies by gesturing, writing, or looking for written response when she is unable to verbalize her response. Unfortunately, she needs environmental supports via communication partner, written templates, gestures, and ability to hand write responses to communicate her thoughts. Pt would make an excellent candidate for a dynamic communication system. We have tried some apps on her phone, however she would benefit from a dedicated speech generating device. Goals have been updated and will request a trial of a communication device. Pt is in agreement with plan of care. Requesting 2x/week for 4 more weeks.   04/28/23: Pt accompanied by a different caregiver today and she was able to locate her name by finding it in her contacts on her phone. She completed responsive naming tasks with 100% acc with use of communication template, described action pictures using subject and verb with 100% acc with mod cues, completed SWSC tasks with 100% acc with min cues, confrontation naming tasks for high frequency words with 100% acc with mod/max cues. She was able to repeat 5 word sentences with 100% acc when provided mi/mod  cues from SLP. SLP submitted request for AAC device trials and Pt expressed excitement over having a more efficient way to help her communicate. Continue to target goals next session.   04/30/2023: SLP submitted request to check insurance benefits for alternative communication device, awaiting response. Pt completed HEP. She used verbal responses, letter board, communication template, and self written responses to answer personally relevant responsive naming tasks with 94% acc with mi/mod use of non-verbal responses. She completed confrontation naming of high frequency pictures with 100% acc with mod cues. Pt expressed that mentally fatiguing for her to verbalize responses at times. SLP provided reassurance and positive reinforcement toward Pt gains this session. She continues to be motivated and uses alternative communication strategies (even able to spell words aloud when unable to verbalize the word) with indirect cues. Continue targeting goals and trials of AAC devices.   05/21/2023: Pt unable to attend therapy in the last few weeks due to scheduling conflicts with her ability to obtain transportation. She was accompanied to today's visit by her sister and her new caregiver. SLP introduced the [place] communication device today. Pt able to turn the device on and select icons with cues from SLP (orientation factors and the specific folders). SLP opened the page to breakfast items and Pt selected 10 items she enjoys eating and was encouraged to use the device to cue herself to speak if unable to verbalize the word. She assisted SLP in adding personally relevant information to her communication template that she would like to have on the device. Her schedule has been updated and she will be able to attend more consistently and expressed  feeling positive about using a speech generating device to assist with communication. Device will need further personalization before we begin a trial.   05/26/2023: Pt  accompanied to therapy by her new caregiver. She communicated to me that her son was shot and is in the hospital. SLP showed Pt how to add icons and folders to the [place] device. Pt requested that we add "[facility]" and was able to then return demonstrate navigation of the device to select the icon and use it to prompt verbal production from her. She also identified two Gospel singers she likes and we added those icons. She was encouraged to verbalize her responses first when answering Wh-questions and then use the device to prompt speech. She verbalized that her son was "38" years old, however when she was prompted to write the age on the dry erase board feature, she wrote his correct age. Solita can often write the word she wants to say when she is unable to verbalize it. She was encouraged to use the camera feature at home to take photos to add to the [employer]. Continue with device navigation and personalization next session.   05/28/2023: Pt used the Lingraphica device at home by completing verbal expression activities in therapy section.  SLP facilitated session by adding additional icons and folders for Pt to express herself in session and with her caregivers at home. She was able to select food choices and restaurants with min assist once opened to the page. SLP cued the Pt to use the device to cue herself to speak (model from device and then attempt to repeat). She expressed that she would like to be able to vote, but didn't think that she was able to. SLP encouraged Pt to have someone from her family to take her to vote and she can request assist as needed. Pt smiled throughout the session and used multi-modality systems to communicate with SLP (gestures, verbal response, AAC device, written responses, and occasionally letter board). Pt continues to be motivated and shows excellent ability to use an AAC. Continue with trials and goals have been updated, cert request.  06/02/2023:  Pt arrived on [facility] without her caregiver this AM and she appeared pleased that she was able to accomplish this on her own. A new caregiver, Raechel Ache, arrived later on in our treatment session. SLP encouraged Pt to introduce herself to the new caregiver and use the device as needed to supplement. Pt required mod cues via offering binary choices (did you have a stroke or a heart attack?) to communicate to caregiver. Harriett Sine added photos to the [place] device and SLP facilitated adding these to her "people" page and needed to rotate pictures etc. SLP created a flow sheet for Pt to follow to learn how to add icons and move to different folders. Pt was able to return demonstrate with moderate cueing. She benefits from repetition of task and was encouraged to practice this at home, while fresh on her mind. She was able to answer responsive naming questions with use of device and verbal response with ~85% acc with min cues for device navigation. Next session, SLP will ask Pt to locate recently added icons to answer responsive naming questions and add a page for medical information.           06/05/2023: [facility] did not pick Pt up for her appointment this AM, but we were able to reschedule her for a later time today. She was cued to refrain from pointing/gesturing when she  wanted an action (pick up her bag, bring her phone, order food) and to verbalize first. She was able to produce single words to convey meaning. Session focused on navigation of Lingraphica device and adding new icons to report symptoms. Aquilah was able to turn on the device, locate the "people" tab and relay who her medical team and caregivers included. She identified three pain descriptors she wanted added: left shoulder pain, bilateral knee pain, and left toe pain. SLP also added stroke related information. We had difficulty copying and moving icons to other folders, so SLP will reach out to Lingraphica to facilitate this. Pt is more efficiently able to  use the Lingraphica device over the letter board and written template, however SLP continues to encourage Pt to use all methods of communication at this time. Continue plan of care and address goals as stated above.   06/09/2023: Elexcia was accompanied by her caregiver, Mae. She was able to turn on the Lingraphica device and navigate to specific folders with indirect cues. She added three phots of items in her home: blender, TV, and her bed. We created an icon for blender and placed in the "kitchen" folder. She was able to then locate the icon, but it did require about 5 steps to arrive to location. She answered personally relevant responsive naming questions with use of device. When SLP asked if she voted, she said yes and that her "Cathren Harsh" took her, however SLP then cued her to use the device to locate the icon of who took her. She then accurately identified her sister. Once SLP assisted with navigation to the restaurants page, she was able to select the placed she goes to and got rid of the ones she does not. Under the [employer] icon, we changed it to a folder and she added: fish sandwich, fries, and sweet tea. She navigated to the medical information page and showed current body aches. Continue to have Pt navigate the device and communicate more complex ideas.  06/11/2023: Tannie was accompanied by her caregiver, Mae. Pt and caregiver added some restaurants into the [place] device, however it was discovered that there are two "restaurant" folders and they are different. Together, we merged the two folders into one. She was able to navigate to specific folders with min cues. She was shown how to follow the listed word sheet at the top to help orient her within the device. We had some trouble copying and moving folders so I emailed Lingraphica to assist with this. She was reminded to not only select the icon, but to try to use it to prompt her to say what the icon represents. She is able to do this with single  word responses better than a full sentence. She is becoming more adept with device navigation and was encouraged to add some more photos over the weekend. Continue with device personalization and navigation.   06/16/2023: Pt added a new caregiver with name and photo over the weekend and placed it in the correct location. She was able to navigate the device and show me what she added. Pt cued to use the device to answer "wh" questions related to favorites, family, and current events and she answered with 95% acc with min assist for more efficient navigation. She often had to look in a few folders to find the information she wanted. We added some phrases and sentences to the "Fast Talk" folder and she was then able to use each phrase appropriately with min prompt (find the icon  to request that transportation service be called to pick you up". We also navigated to the "Therapy" folder and she completed spelling of pictured objects when given the three letters to place in order (95%). She was unable to imitate/repeat the phrases from the video recorded section, so we can try this again next time. Pt pointed to a list in her notebook of medications and phone numbers that she indicated she wanted to place in the [facility]. SLP will pursue completion of evaluation form, as Pt has verbally expressed that she would like to obtain a speaking device for herself.   06/18/23: Pt reported that someone from [employer] called her sister. Pt assisted in completing the assignment of benefits portion online. SLP requested specific biographical information from Pt and she used her notebook, Lingraphica, and gestures to communicate. SLP cued Pt to first try to verbalize her responses and also to use the Lingraphica to prompt a verbal response (when used as a model). She pointed to information written in her notebook that she wanted transferred into her speech generating device. Once information was added, Pt navigated to the  newly added information with min cues from SLP. Pt will need ongoing repetition to locate all the information in an efficient manner. Continue with device navigation and have Pt introduce topics to discuss.   06/23/2023: Pt accompanied to therapy by her caregiver, Mae. Jaiel added her "church" icon over the weekend with the assist of her evening caregiver. SLP asked "Wh-" questions (what is the month, next holiday, caregiver names, etc) and Pt cued to use the Lingraphica device to respond. She is more efficient with the white board feature (drawing) over the keyboard and can quickly write numbers and then use that to prompt oral production. She answered the questions with 80% acc with min assist. She requested the "News" section on the device and selected an article about [employer] to listen/read and answered comprehension questions about the article with 100% acc when provided min cues. She benefits from having the device read aloud the text, the questions, and the multiple choice responses. We added new icons to the "current events" folder to include upcoming holidays and plans. For homework, she will review the "News" section and answer the assigned questions. Would like to have [name] sister attend another session to facilitate carryover of using the device with all family members.   06/25/2023: Pt accompanied to therapy by Mae. Floretta indicated that she used the "News" section on the Lingraphica to listen to current events and answered questions. This session was spent of further device personalization and navigation. She is having trouble finding where certain folders and icons are so I created a written flow chart for her to follow (Start>Going out>Place>Restaurant). She was able to follow this with min cues. She initiated a topic today when looking at the "Hobbies" folder and selected "Gardening". She then selected which plants and flowers she wanted to add when shown picture options from SLP. She was  able to create an icon and find a photo from a Programmer, multimedia with use of the device with indirect cues (after initial modeling). Claudetta is becoming more independent in using the device, but would benefit from further personalization and identify more efficient ways of accessing some icons. We are awaiting approval for her own Lingraphica. Continue using the device in sessions to increase efficiency and independence.  CURRENT TREATMENT: Pt accompanied to therapy by Mae. We spent the session personalizing the Lingraphica device and developing a list of  questions to relay to Lingraphica including: How to sync photos from her phone to the device, what is a "pinned app", how to arrange icons in the order she would like them (can't seem to click and drag), the "move" folder function only seems to work within one folder and not between folders, and how to add a calendar. Ebelin showed me her appointment schedule and we added three new people from her medical team. She added 4 new photos of her personal plants at home and we added these to her "My Interests" page. She is able to navigate the device once she is shown the "flow" to find icons, but still needs more repetition. Continue to target goals. She will bring the device to her cardiology appointment this afternoon and report back if she is able to report symptoms and communicate more effectively.                                                                                                       DATE: 06/30/23  CLINICAL IMPRESSION: (from initial evaluation on 03/19/2023) Patient is a 57 y.o. female who was seen today for speech/language evaluation s/p CVA in May of 2021 with only limited follow up SLP therapy via home health in 2021 when she was staying with her daughter. [name] sister accompanied her to the evaluation and would like for her sister to be able to communicate her wants and needs verbally. 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 Pt has difficulty verbally labeling high frequency objects (but was able to write some responses), repeating longer words/phrases, and reading phrases. She was able to match 5/5 high frequency objects to single words, but was only able to orally read/label 1/5 words. Pt benefited from gesturing to communicate her intent and this occasionally helped elicit word production and she also benefited from SLP phonemic cues and single word sentence completion cues. Recommend SLP therapy to address communication deficits and increase independence with communication 2x/week for 4-8 weeks.  OBJECTIVE IMPAIRMENTS: include executive functioning, expressive language, receptive language, aphasia, and apraxia. These impairments are limiting patient from managing medications, managing appointments, managing finances, and effectively communicating at home and in community. Factors affecting potential to achieve goals and functional outcome are ability to learn/carryover information, severity of impairments, and time post onset of aphasia (2021) . Patient will benefit from skilled SLP services to address above impairments and improve overall function.  REHAB POTENTIAL: Good  PLAN:  SLP FREQUENCY: 2x/week  SLP DURATION: 8 weeks  PLANNED INTERVENTIONS: Cueing hierachy, Internal/external aids, Functional tasks, Multimodal communication approach, SLP instruction and feedback, Compensatory strategies, Patient/family education, and Re-evaluation    Thank you,  Havery Moros, CCC-SLP [phone]  Alleyah Twombly, CCC-SLP 06/30/2023, 9:35 AM

## 2023-07-02 ENCOUNTER — Encounter (HOSPITAL_COMMUNITY): Payer: Self-pay | Admitting: Speech Pathology

## 2023-07-02 ENCOUNTER — Ambulatory Visit (HOSPITAL_COMMUNITY): Payer: 59 | Admitting: Speech Pathology

## 2023-07-02 DIAGNOSIS — R41841 Cognitive communication deficit: Secondary | ICD-10-CM

## 2023-07-02 DIAGNOSIS — I69890 Apraxia following other cerebrovascular disease: Secondary | ICD-10-CM

## 2023-07-02 DIAGNOSIS — R4701 Aphasia: Secondary | ICD-10-CM | POA: Diagnosis not present

## 2023-07-02 NOTE — Therapy (Signed)
OUTPATIENT SPEECH LANGUAGE PATHOLOGY TREATMENT   Patient Name: Robin [name] MRN: [id number] DOB:1965-08-12, 57 y.o., female Today's Date: 07/02/2023  PCP: Jarold Motto, PA REFERRING PROVIDER: Jarold Motto, PA  END OF SESSION:  End of Session - 07/02/23 1102     Visit Number 22    Number of Visits 24    Date for SLP Re-Evaluation 08/14/23    Authorization Type UHC Medicare and Medicaid secondary; Dual complete   eff 08/05/22 ded 240 met oop 8850 met 2034.20 limit-no auth-no co ins-20%   SLP Start Time 0930    SLP Stop Time  1030    SLP Time Calculation (min) 60 min    Activity Tolerance Patient tolerated treatment well             Past Medical History:  Diagnosis Date   Allergy    Anemia    Aortic stenosis    a. 02/2023 Echo: mild-mod AS.   Arthritis    Chronic abdominal pain    Chronic HFrEF (heart failure with reduced ejection fraction) (HCC)    a. EF at 30-35% by echo in 08/2018 b. EF at 35% by repeat echo in 05/2019 c. EF improved to 65-70% in 2021 d. EF at 40-45% in 08/2021; e. 02/2023 Echo: 40-45%, glob HK, mod LVH, nl RV size/fx, sev BAE, mild-mod AS.   Cocaine abuse (HCC)    COPD (chronic obstructive pulmonary disease) (HCC)    Diabetes mellitus without complication (HCC)    Essential hypertension, benign    Expressive aphasia    post CVA   Fatty liver    GERD (gastroesophageal reflux disease)    Gout 2016   Normal coronary arteries    a. 10/2008 abnl MV; b. 10/2008 Cath: nl cors.   Ovarian cyst    Permanent atrial fibrillation (HCC)    Stroke (HCC) 12/26/2019   Right sided weakness, and expressive aphasia   Thoracic ascending aortic aneurysm (HCC)    a. 4 cm 10/31/19 CTA; b. 02/2023 Echo: Nl Ao root.   Type 2 diabetes mellitus (HCC)    type II   Past Surgical History:  Procedure Laterality Date   ABDOMINAL HYSTERECTOMY  09/10/2011   Procedure: HYSTERECTOMY ABDOMINAL;  Surgeon: Tilda Burrow, MD;  Location: [facility];  Service: Gynecology;   Laterality: N/A;  Abdominal hysterectomy   CESAREAN SECTION  [id number], and 1994   CHOLECYSTECTOMY  1995   IR 3D INDEPENDENT WKST  03/16/2020   IR ANGIO INTRA EXTRACRAN SEL INTERNAL CAROTID BILAT MOD SED  03/16/2020   IR ANGIO VERTEBRAL SEL SUBCLAVIAN INNOMINATE UNI L MOD SED  03/16/2020   IR ANGIO VERTEBRAL SEL VERTEBRAL UNI R MOD SED  03/16/2020   IR CT HEAD LTD  12/27/2019   IR PERCUTANEOUS ART THROMBECTOMY/INFUSION INTRACRANIAL INC DIAG ANGIO  12/27/2019   IR US GUIDE VASC ACCESS RIGHT  12/27/2019   IR US GUIDE VASC ACCESS RIGHT  03/16/2020   RADIOLOGY WITH ANESTHESIA N/A 12/27/2019   Procedure: IR WITH ANESTHESIA;  Surgeon: Julieanne Cotton, MD;  Location: [facility];  Service: Radiology;  Laterality: N/A;   SCAR REVISION  09/10/2011   Procedure: SCAR REVISION;  Surgeon: Tilda Burrow, MD;  Location: [facility];  Service: Gynecology;  Laterality: N/A;  Wide Excision of old Cicatrix   TUBAL LIGATION  1994   Patient Active Problem List   Diagnosis Date Noted   Acute on chronic systolic CHF (congestive heart failure) (HCC) 11/27/2021   Stage 3b chronic kidney disease (CKD) (HCC)  11/27/2021   Microcytic anemia 11/27/2021   Lactic acidosis 11/27/2021   Diabetes mellitus (HCC) 03/09/2021   Type 2 diabetes mellitus with stage 3a chronic kidney disease, with long-term current use of insulin (HCC) 03/09/2021   Acute metabolic encephalopathy 12/31/2020   Hyperglycemia 12/31/2020   Prolonged QT interval 05/12/2020   History of CVA with residual deficit 05/12/2020   Atrial fibrillation, chronic (HCC) 05/12/2020   Right hemiparesis (HCC)    Essential hypertension    Chronic diastolic congestive heart failure (HCC)    Dysphagia due to recent stroke 01/05/2020   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 01/05/2020   Embolic cerebral infarction [facility]) s/p clot retrieval 12/27/2019   Thoracic aortic aneurysm without rupture (HCC)    Atrial fibrillation with RVR (HCC) 05/10/2019   Type 2 diabetes  mellitus with diabetic autonomic neuropathy, with long-term current use of insulin (HCC) 09/01/2018   Cocaine abuse (HCC) 08/25/2018   Cigarette nicotine dependence, uncomplicated 06/07/2015   GERD (gastroesophageal reflux disease) 06/07/2011   COPD (chronic obstructive pulmonary disease) (HCC) 06/07/2011   Arthritis 06/07/2011    ONSET DATE: May 2021   REFERRING DIAG: R47.01 (ICD-10-CM) - Expressive aphasia  THERAPY DIAG:  Aphasia  Apraxia following other cerebrovascular disease  Cognitive communication deficit  Rationale for Evaluation and Treatment: Rehabilitation  SUBJECTIVE:   SUBJECTIVE STATEMENT: "What's that called?" (Sweet potato)  Pt accompanied by: self and family member  PERTINENT HISTORY: Robin [name] is a pleasant 57 y.o. female with medical history significant for atrial fibrillation on Eliquis, chronic systolic CHF, CKD 3B, insulin-dependent diabetes mellitus, polysubstance abuse, COPD, GERD, HTN, and history of CVA with residual right-sided weakness and aphasia (L MCA CVA and L ICA occlusion s/p thrombectomy on 12/27/2019 followed by CIR stay where she received ST/OT/PT until d/c home on 02/02/20 and then received some [facility] SLP services). She is referred for outpatient SLP therapy by Jarold Motto, PA to facilitate increased independence with communication.  PAIN:  Are you having pain? No  PATIENT GOALS: Increase ability to talk  OBJECTIVE:   DIAGNOSTIC FINDINGS:  MRI 11/13/2020: IMPRESSION: No evidence of acute intracranial abnormality, including acute infarction.   Redemonstrated large chronic left MCA territory infarct.   Redemonstrated small chronic cortically based infarct within the posterior right temporal lobe.   Mild bilateral ethmoid sinus mucosal thickening.  PATIENT EDUCATION: Education details: Herbalist the [employer] educated: Patient and sister, Robin [name] Education method: Explanation and Handouts Education  comprehension: verbalized understanding and needs further education   GOALS: Goals reviewed with patient? Yes  SHORT TERM GOALS: Target date: 08/15/2023  Pt will increase verbal naming of common objects/pictures to 75% acc when provided with mod multimodality cues Baseline: 80% when allowed to write response or provided mod/max cues Goal status: MET for 100% acc for mod/max cues (phonemic cue provided); change to 100% acc with mod cues; met   2.  Pt will describe objects and pictures by providing at least three salient features (single words ok) as judged by clinician with 80% acc when provided mod cues. Baseline: 70% mod/max cues Goal status: MET for 100% with mod/max cues provided; change to 100% acc with mod cues; met   3.  Pt will complete single word sentence completion tasks [facility]) with 90% acc when provided with min/mod multimodality cues.  Baseline: 75% mod cues of initial phoneme modeled Goal status: MET for 100% acc with mi/mod cues provided; change to 100% with min cues; Partially met, d/c  4.  Pt will  complete basic level reading comprehension tasks (picture cues for sentence level) with 80% acc with provided mi/mod cues. Baseline: 100% word to picture matching f=5, 0/5 sentences Goal status: MET for single words, continue for sentences; Partially Met, use on device  5.  Pt will verbally describe action photos with use of "who" "what doing" template with 80% acc and min/mod cues.  Baseline: 70% mod cues. Goal status: MET for 100% acc with mod/max cues; change to 100% with mod cues; Met  6.  Pt will complete personally relevant responsive naming tasks with 90% acc with use of written cues as needed. Baseline: 75% acc Goal status: MET  7. Pt will use a communication device to cue themselves to speak (reading or imitating words) during 1:1 conversations with SLP with min/mod cues over 3 consecutive sessions.             Baseline: Max assist             Goal status: MET;  change to min cues   8. Pt will respond to personal preference questions by answering with alternative communication device with 90% acc and min assist.             Baseline: Max assist             Goal status: Met   9. Pt will use alternative means of communication (low tech and high tech) to maximize independence with communication and find a system that is most efficient for her.             Baseline: Trial of Lingraphica, Spoken App, letter board, etc             Goal status: MET/ONGOING   10. Pt will use alternative means of communication to locate list of medications, health symptoms, and personal/bio information with indirect cues.             Baseline: mod assist             Goal status: Partially Met/Continue with updated medications  11. Pt will reduce social isolation by using the device to share opinions, tell stories, and  maintain/develop social connections with indirect cues  Baseline: min/mod assist             Goal status: NEW  LONG TERM GOALS: Target date: 08/15/2023  Pt will communicate moderately complex wants/needs to [facility] with min assist from communication partner and use of multimodality communication strategies. Baseline: Pt requires mod assist from communication partner  Goal status: ONGOING   ASSESSMENT:  PREVIOUS TREATMENT:     03/26/23: Pt accompanied to therapy this date by her caregiver, Agustin Cree and her sister. They brought in a notebook and completed a good portion of the personalized communication template. SLP reviewed the information with Pt and Pt asked to read names of family members. Pt able to read 75% of the names and benefited from cues to look away/provide diversion and return to task when she could not read it right away. Family provided education on ways to help her with this at home. She was able to write the letters with her finger on the table 90% of the time and even orally spell when unable to say the word. Pt completed automatic speech tasks  with 80% acc, but needed cues to decrease rate to increase accuracy of responses. Goals and evaluation were reviewed. Pt smiling and engaged throughout the session. She occasionally stated, "I can't do that", but with encouragement she tried to complete  the request. Her sister commented that she seems to be able to communicate with some family members better than others and SLP suggested that she ascertain the content of conversation (ie. Harder to talk about personal needs with sister than lighter conversation with an aunt). Target above goals next session.   04/03/2023: Pt accompanied by caregiver, Agustin Cree and brought in her notebook. Pt completed confrontation naming task with 50% acc without cues and increased to 90% acc with cues (Pt able to use letter board to spell the word 90% of the time). She completed single word sentence completion (SWSC) task by providing accurate verbal response when looking at pictures with 80% acc with mi/mod cues. She was then asked to repeat the word x3 and SLP interjected with confounds at times and Pt asked to return to word. This was very challenging for Pt with performance improved throughout trials. She was able to provide a gesture for each work with 100% acc. She answered responsive naming questions with use of personal communication template as needed with 95% acc. SLP provided strategies for accurate number production by having Pt count on her fingers and show the number on her hands and then verbalize the number, which appeared effective. Introduce the "Say it" set list of words next session.   04/09/2023: Pt accompanied by her caregiver, Robin [name]. Pt indicated that she went to the ED yesterday due to chest pain and nose bleed. She currently feels much better. She completed confrontation naming of high frequency words with 50% acc, single word sentence completion task with 100% acc with min cues, responsive naming with 90% and mod cues, and elicited appropriate gesture  for words with 100% acc with min assist. She was cued to use her personalized communication template to help find answers to responsive naming tasks. During confrontation naming tasks, she benefited from being able to write the word, but had difficulty saying the word without phonemic and sentence cues. New words were added to her communication template (purse, keys, phone, water, etc). Continue to target goals above and focus on responsive naming tasks for personal information.     04/14/2023: Pt accompanied by her caregiver, Robin [name]. She verbalized that she went to [place] over the weekend to see friends/family. She completed responsive naming tasks in conversation regarding personally relevant information with 90% acc with ~75% acc for verbal responses and 100% when allowed to write or use written cues. She completed confrontation naming task when looking at icons on a tablet with 70% acc with mod cues, but was able to repeat the name of the item after model provided, with 90% acc. She was introduced to the [place] tablet today and she expressed interest in using it to help her communicate. She indicates that she thinks she has something similar at home and will bring in next session. She was able to quickly point to 7 fruits that she likes to eat and was able to repeat 6/7 after model provided. Update goals next session.       04/17/23:  Pt was accompanied by a new caregiver today because Robin [name] had an appointment. Pt was able to communicate this to SLP with use of written personalized template to identify the name "Robin [name]". Robin [name] answered personally relevant responsive naming questions (regarding names of friends, family, and medical professionals) with 100% acc with use of communication support as needed and accurate verbal response 90% of the time with min cues. Pt verbalized a subject, verb/action, and object when looking at action photos with 92%  acc with mi/mod cues and was then able to repeat  with written model provided with 100% acc with min assist. She then completed single word sentence completion task with 100% acc with min cues. Robin [name] occasionally shuts down when a task feels out of her reach or too challenging and needs encouragement to try the task. She was trying to verbalize "smelling" and produced "smoking" instead, but was able to break the perseveration and produce "smelling" on ~70% of cued trials. She was given divergent naming task with word bank provided for homework and completed trial x 2 before ending the session. Next session, continue to expansive naming tasks to generate sentences when looking at photos.   04/23/2023: Pt accompanied to therapy by Robin [name]. She completed responsive naming task by answering personally relevant questions with use of written cues provided with 100% acc. She is unable to verbalize responses independently without use of written cues or initial phoneme cue from SLP. She named common objects with 100% acc when allowed to write her response or when given initial phoneme cue and she completed single word sentence completion tasks with 100% acc with provided mi/mod cues. She independently uses total communication strategies by gesturing, writing, or looking for written response when she is unable to verbalize her response. Unfortunately, she needs environmental supports via communication partner, written templates, gestures, and ability to hand write responses to communicate her thoughts. Pt would make an excellent candidate for a dynamic communication system. We have tried some apps on her phone, however she would benefit from a dedicated speech generating device. Goals have been updated and will request a trial of a communication device. Pt is in agreement with plan of care. Requesting 2x/week for 4 more weeks.   04/28/23: Pt accompanied by a different caregiver today and she was able to locate her name by finding it in her contacts on her phone. She  completed responsive naming tasks with 100% acc with use of communication template, described action pictures using subject and verb with 100% acc with mod cues, completed SWSC tasks with 100% acc with min cues, confrontation naming tasks for high frequency words with 100% acc with mod/max cues. She was able to repeat 5 word sentences with 100% acc when provided mi/mod cues from SLP. SLP submitted request for AAC device trials and Pt expressed excitement over having a more efficient way to help her communicate. Continue to target goals next session.   04/30/2023: SLP submitted request to check insurance benefits for alternative communication device, awaiting response. Pt completed HEP. She used verbal responses, letter board, communication template, and self written responses to answer personally relevant responsive naming tasks with 94% acc with mi/mod use of non-verbal responses. She completed confrontation naming of high frequency pictures with 100% acc with mod cues. Pt expressed that mentally fatiguing for her to verbalize responses at times. SLP provided reassurance and positive reinforcement toward Pt gains this session. She continues to be motivated and uses alternative communication strategies (even able to spell words aloud when unable to verbalize the word) with indirect cues. Continue targeting goals and trials of AAC devices.   05/21/2023: Pt unable to attend therapy in the last few weeks due to scheduling conflicts with her ability to obtain transportation. She was accompanied to today's visit by her sister and her new caregiver. SLP introduced the [place] communication device today. Pt able to turn the device on and select icons with cues from SLP (orientation factors and the specific folders). SLP opened the page  to breakfast items and Pt selected 10 items she enjoys eating and was encouraged to use the device to cue herself to speak if unable to verbalize the word. She assisted SLP in adding  personally relevant information to her communication template that she would like to have on the device. Her schedule has been updated and she will be able to attend more consistently and expressed feeling positive about using a speech generating device to assist with communication. Device will need further personalization before we begin a trial.   05/26/2023: Pt accompanied to therapy by her new caregiver. She communicated to me that her son was shot and is in the hospital. SLP showed Pt how to add icons and folders to the [place] device. Pt requested that we add "[facility]" and was able to then return demonstrate navigation of the device to select the icon and use it to prompt verbal production from her. She also identified two Gospel singers she likes and we added those icons. She was encouraged to verbalize her responses first when answering Wh-questions and then use the device to prompt speech. She verbalized that her son was "28" years old, however when she was prompted to write the age on the dry erase board feature, she wrote his correct age. Yar can often write the word she wants to say when she is unable to verbalize it. She was encouraged to use the camera feature at home to take photos to add to the [employer]. Continue with device navigation and personalization next session.   05/28/2023: Pt used the Lingraphica device at home by completing verbal expression activities in therapy section.  SLP facilitated session by adding additional icons and folders for Pt to express herself in session and with her caregivers at home. She was able to select food choices and restaurants with min assist once opened to the page. SLP cued the Pt to use the device to cue herself to speak (model from device and then attempt to repeat). She expressed that she would like to be able to vote, but didn't think that she was able to. SLP encouraged Pt to have someone from her family to take her to vote  and she can request assist as needed. Pt smiled throughout the session and used multi-modality systems to communicate with SLP (gestures, verbal response, AAC device, written responses, and occasionally letter board). Pt continues to be motivated and shows excellent ability to use an AAC. Continue with trials and goals have been updated, cert request.  06/02/2023: Pt arrived on [facility] without her caregiver this AM and she appeared pleased that she was able to accomplish this on her own. A new caregiver, Robin [name], arrived later on in our treatment session. SLP encouraged Pt to introduce herself to the new caregiver and use the device as needed to supplement. Pt required mod cues via offering binary choices (did you have a stroke or a heart attack?) to communicate to caregiver. Robin [name] added photos to the [place] device and SLP facilitated adding these to her "people" page and needed to rotate pictures etc. SLP created a flow sheet for Pt to follow to learn how to add icons and move to different folders. Pt was able to return demonstrate with moderate cueing. She benefits from repetition of task and was encouraged to practice this at home, while fresh on her mind. She was able to answer responsive naming questions with use of device and verbal response with ~85% acc with min cues for device navigation.  Next session, SLP will ask Pt to locate recently added icons to answer responsive naming questions and add a page for medical information.           06/05/2023: [facility] did not pick Pt up for her appointment this AM, but we were able to reschedule her for a later time today. She was cued to refrain from pointing/gesturing when she wanted an action (pick up her bag, bring her phone, order food) and to verbalize first. She was able to produce single words to convey meaning. Session focused on navigation of Lingraphica device and adding new icons to report symptoms. Robin [name] was able to turn on the device, locate the "people"  tab and relay who her medical team and caregivers included. She identified three pain descriptors she wanted added: left shoulder pain, bilateral knee pain, and left toe pain. SLP also added stroke related information. We had difficulty copying and moving icons to other folders, so SLP will reach out to Lingraphica to facilitate this. Pt is more efficiently able to use the Lingraphica device over the letter board and written template, however SLP continues to encourage Pt to use all methods of communication at this time. Continue plan of care and address goals as stated above.   06/09/2023: Robin [name] was accompanied by her caregiver, Robin [name]. She was able to turn on the Lingraphica device and navigate to specific folders with indirect cues. She added three phots of items in her home: blender, TV, and her bed. We created an icon for blender and placed in the "kitchen" folder. She was able to then locate the icon, but it did require about 5 steps to arrive to location. She answered personally relevant responsive naming questions with use of device. When SLP asked if she voted, she said yes and that her "Cathren Harsh" took her, however SLP then cued her to use the device to locate the icon of who took her. She then accurately identified her sister. Once SLP assisted with navigation to the restaurants page, she was able to select the placed she goes to and got rid of the ones she does not. Under the [employer] icon, we changed it to a folder and she added: fish sandwich, fries, and sweet tea. She navigated to the medical information page and showed current body aches. Continue to have Pt navigate the device and communicate more complex ideas.  06/11/2023: Robin [name] was accompanied by her caregiver, Robin [name]. Pt and caregiver added some restaurants into the [place] device, however it was discovered that there are two "restaurant" folders and they are different. Together, we merged the two folders into one. She was able to navigate to  specific folders with min cues. She was shown how to follow the listed word sheet at the top to help orient her within the device. We had some trouble copying and moving folders so I emailed Lingraphica to assist with this. She was reminded to not only select the icon, but to try to use it to prompt her to say what the icon represents. She is able to do this with single word responses better than a full sentence. She is becoming more adept with device navigation and was encouraged to add some more photos over the weekend. Continue with device personalization and navigation.   06/16/2023: Pt added a new caregiver with name and photo over the weekend and placed it in the correct location. She was able to navigate the device and show me what she added. Pt cued to use the device  to answer "wh" questions related to favorites, family, and current events and she answered with 95% acc with min assist for more efficient navigation. She often had to look in a few folders to find the information she wanted. We added some phrases and sentences to the "Fast Talk" folder and she was then able to use each phrase appropriately with min prompt (find the icon to request that transportation service be called to pick you up". We also navigated to the "Therapy" folder and she completed spelling of pictured objects when given the three letters to place in order (95%). She was unable to imitate/repeat the phrases from the video recorded section, so we can try this again next time. Pt pointed to a list in her notebook of medications and phone numbers that she indicated she wanted to place in the [facility]. SLP will pursue completion of evaluation form, as Pt has verbally expressed that she would like to obtain a speaking device for herself.   06/18/23: Pt reported that someone from [place] called her sister. Pt assisted in completing the assignment of benefits portion online. SLP requested specific biographical information from  Pt and she used her notebook, Lingraphica, and gestures to communicate. SLP cued Pt to first try to verbalize her responses and also to use the Lingraphica to prompt a verbal response (when used as a model). She pointed to information written in her notebook that she wanted transferred into her speech generating device. Once information was added, Pt navigated to the newly added information with min cues from SLP. Pt will need ongoing repetition to locate all the information in an efficient manner. Continue with device navigation and have Pt introduce topics to discuss.   06/23/2023: Pt accompanied to therapy by her caregiver, Robin [name]. Robin [name] added her "church" icon over the weekend with the assist of her evening caregiver. SLP asked "Wh-" questions (what is the month, next holiday, caregiver names, etc) and Pt cued to use the Lingraphica device to respond. She is more efficient with the white board feature (drawing) over the keyboard and can quickly write numbers and then use that to prompt oral production. She answered the questions with 80% acc with min assist. She requested the "News" section on the device and selected an article about Robin [name] to listen/read and answered comprehension questions about the article with 100% acc when provided min cues. She benefits from having the device read aloud the text, the questions, and the multiple choice responses. We added new icons to the "current events" folder to include upcoming holidays and plans. For homework, she will review the "News" section and answer the assigned questions. Would like to have [name] sister attend another session to facilitate carryover of using the device with all family members.   06/25/2023: Pt accompanied to therapy by Robin [name]. Robin [name] indicated that she used the "News" section on the Lingraphica to listen to current events and answered questions. This session was spent of further device personalization and navigation. She is having trouble  finding where certain folders and icons are so I created a written flow chart for her to follow (Start>Going out>Place>Restaurant). She was able to follow this with min cues. She initiated a topic today when looking at the "Hobbies" folder and selected "Gardening". She then selected which plants and flowers she wanted to add when shown picture options from SLP. She was able to create an icon and find a photo from a Programmer, multimedia with use of the device with indirect cues (after initial  modeling). Robin [name] is becoming more independent in using the device, but would benefit from further personalization and identify more efficient ways of accessing some icons. We are awaiting approval for her own Lingraphica. Continue using the device in sessions to increase efficiency and independence.  06/30/2023: Pt accompanied to therapy by Robin [name]. We spent the session personalizing the Lingraphica device and developing a list of questions to relay to Lingraphica including: How to sync photos from her phone to the device, what is a "pinned app", how to arrange icons in the order she would like them (can't seem to click and drag), the "move" folder function only seems to work within one folder and not between folders, and how to add a calendar. Robin [name] showed me her appointment schedule and we added three new people from her medical team. She added 4 new photos of her personal plants at home and we added these to her "My Interests" page. She is able to navigate the device once she is shown the "flow" to find icons, but still needs more repetition. Continue to target goals. She will bring the device to her cardiology appointment this afternoon and report back if she is able to report symptoms and communicate more effectively.   CURRENT TREATMENT: Pt accompanied to therapy by Robin [name]. Robin [name] used the device to share information with SLP regarding her recent cardiology appointment and who the appointment was with. She obtained business cards from  all of the practitioners at the cardiology practice and showed to me, as she wanted to add the providers she has seen before, to the device. We also looked through her medical chart to locate other practitioners and she selected which ones she wanted added to her device. SLP showed Pt a few different layouts for having either 8, 10, or 12 icons for each screen and Pt selected "12". She needs min cues to navigate and also follows the written index card with a flow sheet for her to follow to facilitate device navigation. She also needs reminders to use the device to answer questions she is struggling to verbalize when we are not working on a designated or structured task. We added a new folder entitled, "future appointments" and she was then able to navigate with min cues. We are awaiting authorization for her device and she is very excited to obtain. She will need an additional ~8 sessions to further personalize and then practice increased independence with navigation. Will request and goals above have been updated.                                                                                                      DATE: 07/02/23  CLINICAL IMPRESSION: (from initial evaluation on 03/19/2023) Patient is a 57 y.o. female who was seen today for speech/language evaluation s/p CVA in May of 2021 with only limited follow up SLP therapy via home health in 2021 when she was staying with her daughter. [name] sister accompanied her to the evaluation and would like for her sister to be able to communicate her wants and needs verbally. Pt presents with  moderate expressive aphasia and suspect apraxia and mi/mod receptive aphasia with relative strengths in her ability to write single word responses during naming tasks, use of gestures, automatic speech tasks, repetition of single, high frequency words, responding accurately to basic yes/no questions, following basic 1 step directions, and object recognition. Pt has difficulty  verbally labeling high frequency objects (but was able to write some responses), repeating longer words/phrases, and reading phrases. She was able to match 5/5 high frequency objects to single words, but was only able to orally read/label 1/5 words. Pt benefited from gesturing to communicate her intent and this occasionally helped elicit word production and she also benefited from SLP phonemic cues and single word sentence completion cues. Recommend SLP therapy to address communication deficits and increase independence with communication 2x/week for 4-8 weeks.  OBJECTIVE IMPAIRMENTS: include executive functioning, expressive language, receptive language, aphasia, and apraxia. These impairments are limiting patient from managing medications, managing appointments, managing finances, and effectively communicating at home and in community. Factors affecting potential to achieve goals and functional outcome are ability to learn/carryover information, severity of impairments, and time post onset of aphasia (2021) . Patient will benefit from skilled SLP services to address above impairments and improve overall function.  REHAB POTENTIAL: Good  PLAN:  SLP FREQUENCY: 2x/week  SLP DURATION: 8 weeks  PLANNED INTERVENTIONS: Cueing hierachy, Internal/external aids, Functional tasks, Multimodal communication approach, SLP instruction and feedback, Compensatory strategies, Patient/family education, and Re-evaluation    Thank you,  Havery Moros, CCC-SLP [phone]  Lester Crickenberger, CCC-SLP 07/02/2023, 11:05 AM

## 2023-07-16 ENCOUNTER — Encounter (HOSPITAL_COMMUNITY): Payer: Self-pay | Admitting: Speech Pathology

## 2023-07-16 ENCOUNTER — Ambulatory Visit (HOSPITAL_COMMUNITY): Payer: 59 | Attending: Physician Assistant | Admitting: Speech Pathology

## 2023-07-16 DIAGNOSIS — R41841 Cognitive communication deficit: Secondary | ICD-10-CM | POA: Insufficient documentation

## 2023-07-16 DIAGNOSIS — R4701 Aphasia: Secondary | ICD-10-CM | POA: Diagnosis present

## 2023-07-16 DIAGNOSIS — I69890 Apraxia following other cerebrovascular disease: Secondary | ICD-10-CM | POA: Insufficient documentation

## 2023-07-16 NOTE — Therapy (Signed)
OUTPATIENT SPEECH LANGUAGE PATHOLOGY TREATMENT   Patient Name: Robin [name] MRN: [id number] DOB:[birth year], 57 y.o., female Today's Date: 07/16/2023  PCP: Jarold Motto, PA REFERRING PROVIDER: Jarold Motto, PA  END OF SESSION:  End of Session - 07/16/23 1000     Visit Number 23    Number of Visits 30    Date for SLP Re-Evaluation 08/14/23    Authorization Type [employer] Medicare and Medicaid secondary; Dual complete   eff 08/05/22 ded 240 met oop 8850 met 2034.20 limit-no auth-no co ins-20%   SLP Start Time 0850    SLP Stop Time  0935    SLP Time Calculation (min) 45 min    Activity Tolerance Patient tolerated treatment well             Past Medical History:  Diagnosis Date   Allergy    Anemia    Aortic stenosis    a. 02/2023 Echo: mild-mod AS.   Arthritis    Chronic abdominal pain    Chronic HFrEF (heart failure with reduced ejection fraction) (HCC)    a. EF at 30-35% by echo in 08/2018 b. EF at 35% by repeat echo in 05/2019 c. EF improved to 65-70% in 2021 d. EF at 40-45% in 08/2021; e. 02/2023 Echo: 40-45%, glob HK, mod LVH, nl RV size/fx, sev BAE, mild-mod AS.   Cocaine abuse (HCC)    COPD (chronic obstructive pulmonary disease) (HCC)    Diabetes mellitus without complication (HCC)    Essential hypertension, benign    Expressive aphasia    post CVA   Fatty liver    GERD (gastroesophageal reflux disease)    Gout 2016   Normal coronary arteries    a. 10/2008 abnl MV; b. 10/2008 Cath: nl cors.   Ovarian cyst    Permanent atrial fibrillation (HCC)    Stroke (HCC) 12/26/2019   Right sided weakness, and expressive aphasia   Thoracic ascending aortic aneurysm (HCC)    a. 4 cm 10/31/19 CTA; b. 02/2023 Echo: Nl Ao root.   Type 2 diabetes mellitus (HCC)    type II   Past Surgical History:  Procedure Laterality Date   ABDOMINAL HYSTERECTOMY  09/10/2011   Procedure: HYSTERECTOMY ABDOMINAL;  Surgeon: Tilda Burrow, MD;  Location: [facility];  Service: Gynecology;   Laterality: N/A;  Abdominal hysterectomy   CESAREAN SECTION  [phone], and 1994   CHOLECYSTECTOMY  1995   IR 3D INDEPENDENT WKST  03/16/2020   IR ANGIO INTRA EXTRACRAN SEL INTERNAL CAROTID BILAT MOD SED  03/16/2020   IR ANGIO VERTEBRAL SEL SUBCLAVIAN INNOMINATE UNI L MOD SED  03/16/2020   IR ANGIO VERTEBRAL SEL VERTEBRAL UNI R MOD SED  03/16/2020   IR CT HEAD LTD  12/27/2019   IR PERCUTANEOUS ART THROMBECTOMY/INFUSION INTRACRANIAL INC DIAG ANGIO  12/27/2019   IR US GUIDE VASC ACCESS RIGHT  12/27/2019   IR US GUIDE VASC ACCESS RIGHT  03/16/2020   RADIOLOGY WITH ANESTHESIA N/A 12/27/2019   Procedure: IR WITH ANESTHESIA;  Surgeon: Julieanne Cotton, MD;  Location: [facility];  Service: Radiology;  Laterality: N/A;   SCAR REVISION  09/10/2011   Procedure: SCAR REVISION;  Surgeon: Tilda Burrow, MD;  Location: [facility];  Service: Gynecology;  Laterality: N/A;  Wide Excision of old Cicatrix   TUBAL LIGATION  1994   Patient Active Problem List   Diagnosis Date Noted   Acute on chronic systolic CHF (congestive heart failure) (HCC) 11/27/2021   Stage 3b chronic kidney disease (CKD) (HCC)  11/27/2021   Microcytic anemia 11/27/2021   Lactic acidosis 11/27/2021   Diabetes mellitus (HCC) 03/09/2021   Type 2 diabetes mellitus with stage 3a chronic kidney disease, with long-term current use of insulin (HCC) 03/09/2021   Acute metabolic encephalopathy 12/31/2020   Hyperglycemia 12/31/2020   Prolonged QT interval 05/12/2020   History of CVA with residual deficit 05/12/2020   Atrial fibrillation, chronic (HCC) 05/12/2020   Right hemiparesis (HCC)    Essential hypertension    Chronic diastolic congestive heart failure (HCC)    Dysphagia due to recent stroke 01/05/2020   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 01/05/2020   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 12/27/2019   Thoracic aortic aneurysm without rupture (HCC)    Atrial fibrillation with RVR (HCC) 05/10/2019   Type 2 diabetes  mellitus with diabetic autonomic neuropathy, with long-term current use of insulin (HCC) 09/01/2018   Cocaine abuse (HCC) 08/25/2018   Cigarette nicotine dependence, uncomplicated 06/07/2015   GERD (gastroesophageal reflux disease) 06/07/2011   COPD (chronic obstructive pulmonary disease) (HCC) 06/07/2011   Arthritis 06/07/2011    ONSET DATE: May 2021   REFERRING DIAG: R47.01 (ICD-10-CM) - Expressive aphasia  THERAPY DIAG:  Aphasia  Apraxia following other cerebrovascular disease  Cognitive communication deficit  Rationale for Evaluation and Treatment: Rehabilitation  SUBJECTIVE:   SUBJECTIVE STATEMENT: "Where you been?"  Pt accompanied by: self and family member  PERTINENT HISTORY: Robin [name] is a pleasant 57 y.o. female with medical history significant for atrial fibrillation on Eliquis, chronic systolic CHF, CKD 3B, insulin-dependent diabetes mellitus, polysubstance abuse, COPD, GERD, HTN, and history of CVA with residual right-sided weakness and aphasia (L MCA CVA and L ICA occlusion s/p thrombectomy on 12/27/2019 followed by CIR stay where she received ST/OT/PT until d/c home on 02/02/20 and then received some [facility] SLP services). She is referred for outpatient SLP therapy by Jarold Motto, PA to facilitate increased independence with communication.  PAIN:  Are you having pain? No  PATIENT GOALS: Increase ability to talk  OBJECTIVE:   DIAGNOSTIC FINDINGS:  MRI 11/13/2020: IMPRESSION: No evidence of acute intracranial abnormality, including acute infarction.   Redemonstrated large chronic left MCA territory infarct.   Redemonstrated small chronic cortically based infarct within the posterior right temporal lobe.   Mild bilateral ethmoid sinus mucosal thickening.  PATIENT EDUCATION: Education details: Herbalist the [employer] educated: Patient and sister, Robin [name] Education method: Explanation and Handouts Education comprehension: verbalized  understanding and needs further education   GOALS: Goals reviewed with patient? Yes  SHORT TERM GOALS: Target date: 08/15/2023  Pt will increase verbal naming of common objects/pictures to 75% acc when provided with mod multimodality cues Baseline: 80% when allowed to write response or provided mod/max cues Goal status: MET for 100% acc for mod/max cues (phonemic cue provided); change to 100% acc with mod cues; met   2.  Pt will describe objects and pictures by providing at least three salient features (single words ok) as judged by clinician with 80% acc when provided mod cues. Baseline: 70% mod/max cues Goal status: MET for 100% with mod/max cues provided; change to 100% acc with mod cues; met   3.  Pt will complete single word sentence completion tasks [facility]) with 90% acc when provided with min/mod multimodality cues.  Baseline: 75% mod cues of initial phoneme modeled Goal status: MET for 100% acc with mi/mod cues provided; change to 100% with min cues; Partially met, d/c  4.  Pt will complete basic  level reading comprehension tasks (picture cues for sentence level) with 80% acc with provided mi/mod cues. Baseline: 100% word to picture matching f=5, 0/5 sentences Goal status: MET for single words, continue for sentences; Partially Met, use on device  5.  Pt will verbally describe action photos with use of "who" "what doing" template with 80% acc and min/mod cues.  Baseline: 70% mod cues. Goal status: MET for 100% acc with mod/max cues; change to 100% with mod cues; Met  6.  Pt will complete personally relevant responsive naming tasks with 90% acc with use of written cues as needed. Baseline: 75% acc Goal status: MET  7. Pt will use a communication device to cue themselves to speak (reading or imitating words) during 1:1 conversations with SLP with min/mod cues over 3 consecutive sessions.             Baseline: Max assist             Goal status: MET; change to min cues   8. Pt  will respond to personal preference questions by answering with alternative communication device with 90% acc and min assist.             Baseline: Max assist             Goal status: Met   9. Pt will use alternative means of communication (low tech and high tech) to maximize independence with communication and find a system that is most efficient for her.             Baseline: Trial of Lingraphica, Spoken App, letter board, etc             Goal status: MET/ONGOING   10. Pt will use alternative means of communication to locate list of medications, health symptoms, and personal/bio information with indirect cues.             Baseline: mod assist             Goal status: Partially Met/Continue with updated medications  11. Pt will reduce social isolation by using the device to share opinions, tell stories, and  maintain/develop social connections with indirect cues  Baseline: min/mod assist             Goal status: ONGOING  LONG TERM GOALS: Target date: 08/15/2023  Pt will communicate moderately complex wants/needs to [facility] with min assist from communication partner and use of multimodality communication strategies. Baseline: Pt requires mod assist from communication partner  Goal status: ONGOING   ASSESSMENT:  PREVIOUS TREATMENT:  Pt accompanied to therapy by Robin [name]. Robin [name] used the device to share information with SLP regarding her recent cardiology appointment and who the appointment was with. She obtained business cards from all of the practitioners at the cardiology practice and showed to me, as she wanted to add the providers she has seen before, to the device. We also looked through her medical chart to locate other practitioners and she selected which ones she wanted added to her device. SLP showed Pt a few different layouts for having either 8, 10, or 12 icons for each screen and Pt selected "12". She needs min cues to navigate and also follows the written index card with a flow sheet for  her to follow to facilitate device navigation. She also needs reminders to use the device to answer questions she is struggling to verbalize when we are not working on a designated or structured task. We added a new folder entitled, "future appointments"  and she was then able to navigate with min cues. We are awaiting authorization for her device and she is very excited to obtain. She will need an additional ~8 sessions to further personalize and then practice increased independence with navigation. Will request and goals above have been updated.   CURRENT TREATMENT: Pt accompanied to therapy by Robin [name]. She received her permanent Lingraphica device and we spent the session identifying which photos transferred and what needed to be added. She was able to use the new device to select icons to alert SLP which photos needed to be updated. She also requested that new icons be added to help her order meals form [employer]. She demonstrated navigation of the device with min cues from SLP and some modeling provided. SLP observed Robin [name] communicating with front office staff to request that her transportation be called and that she receive a copy of her schedule. We rehearsed this in treatment room prior to talking with staff. Next session, assist Robin [name] in placing folders in most efficient manner to facilitate use of the device with novel communication partners.                                                                                                       DATE: 07/16/23  CLINICAL IMPRESSION: (from initial evaluation on 03/19/2023) Patient is a 57 y.o. female who was seen today for speech/language evaluation s/p CVA in May of 2021 with only limited follow up SLP therapy via home health in 2021 when she was staying with her daughter. [name] sister accompanied her to the evaluation and would like for her sister to be able to communicate her wants and needs verbally. 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 Pt has difficulty verbally labeling high frequency objects (but was able to write some responses), repeating longer words/phrases, and reading phrases. She was able to match 5/5 high frequency objects to single words, but was only able to orally read/label 1/5 words. Pt benefited from gesturing to communicate her intent and this occasionally helped elicit word production and she also benefited from SLP phonemic cues and single word sentence completion cues. Recommend SLP therapy to address communication deficits and increase independence with communication 2x/week for 4-8 weeks.  OBJECTIVE IMPAIRMENTS: include executive functioning, expressive language, receptive language, aphasia, and apraxia. These impairments are limiting patient from managing medications, managing appointments, managing finances, and effectively communicating at home and in community. Factors affecting potential to achieve goals and functional outcome are ability to learn/carryover information, severity of impairments, and time post onset of aphasia (2021) . Patient will benefit from skilled SLP services to address above impairments and improve overall function.  REHAB POTENTIAL: Good  PLAN:  SLP FREQUENCY: 2x/week  SLP DURATION: 8 weeks  PLANNED INTERVENTIONS: Cueing hierachy, Internal/external aids, Functional tasks, Multimodal communication approach,  SLP instruction and feedback, Compensatory strategies, Patient/family education, and Re-evaluation    Thank you,  Havery Moros, CCC-SLP [phone]  Kiran Lapine, CCC-SLP 07/16/2023, 10:08 AM

## 2023-07-17 ENCOUNTER — Ambulatory Visit (HOSPITAL_COMMUNITY): Payer: 59 | Admitting: Speech Pathology

## 2023-07-17 ENCOUNTER — Other Ambulatory Visit: Payer: Self-pay | Admitting: Physician Assistant

## 2023-07-17 ENCOUNTER — Encounter (HOSPITAL_COMMUNITY): Payer: Self-pay | Admitting: Speech Pathology

## 2023-07-17 DIAGNOSIS — R4701 Aphasia: Secondary | ICD-10-CM | POA: Diagnosis not present

## 2023-07-17 DIAGNOSIS — R41841 Cognitive communication deficit: Secondary | ICD-10-CM

## 2023-07-17 DIAGNOSIS — I69890 Apraxia following other cerebrovascular disease: Secondary | ICD-10-CM

## 2023-07-17 NOTE — Therapy (Signed)
OUTPATIENT SPEECH LANGUAGE PATHOLOGY TREATMENT   Patient Name: Robin [name] MRN: [id number] DOB:12-24-1965, 57 y.o., female Today's Date: 07/17/2023  PCP: Jarold Motto, PA REFERRING PROVIDER: Jarold Motto, PA  END OF SESSION:  End of Session - 07/17/23 1047     Visit Number 24    Number of Visits 30    Date for SLP Re-Evaluation 08/14/23    Authorization Type [employer] Medicare and Medicaid secondary; Dual complete   eff 08/05/22 ded 240 met oop 8850 met 2034.20 limit-no auth-no co ins-20%   SLP Start Time 0955    SLP Stop Time  1040    SLP Time Calculation (min) 45 min    Activity Tolerance Patient tolerated treatment well             Past Medical History:  Diagnosis Date   Allergy    Anemia    Aortic stenosis    a. 02/2023 Echo: mild-mod AS.   Arthritis    Chronic abdominal pain    Chronic HFrEF (heart failure with reduced ejection fraction) (HCC)    a. EF at 30-35% by echo in 08/2018 b. EF at 35% by repeat echo in 05/2019 c. EF improved to 65-70% in 2021 d. EF at 40-45% in 08/2021; e. 02/2023 Echo: 40-45%, glob HK, mod LVH, nl RV size/fx, sev BAE, mild-mod AS.   Cocaine abuse (HCC)    COPD (chronic obstructive pulmonary disease) (HCC)    Diabetes mellitus without complication (HCC)    Essential hypertension, benign    Expressive aphasia    post CVA   Fatty liver    GERD (gastroesophageal reflux disease)    Gout 2016   Normal coronary arteries    a. 10/2008 abnl MV; b. 10/2008 Cath: nl cors.   Ovarian cyst    Permanent atrial fibrillation (HCC)    Stroke (HCC) 12/26/2019   Right sided weakness, and expressive aphasia   Thoracic ascending aortic aneurysm (HCC)    a. 4 cm 10/31/19 CTA; b. 02/2023 Echo: Nl Ao root.   Type 2 diabetes mellitus (HCC)    type II   Past Surgical History:  Procedure Laterality Date   ABDOMINAL HYSTERECTOMY  09/10/2011   Procedure: HYSTERECTOMY ABDOMINAL;  Surgeon: Tilda Burrow, MD;  Location: [facility];  Service: Gynecology;   Laterality: N/A;  Abdominal hysterectomy   CESAREAN SECTION  [phone], and 1994   CHOLECYSTECTOMY  1995   IR 3D INDEPENDENT WKST  03/16/2020   IR ANGIO INTRA EXTRACRAN SEL INTERNAL CAROTID BILAT MOD SED  03/16/2020   IR ANGIO VERTEBRAL SEL SUBCLAVIAN INNOMINATE UNI L MOD SED  03/16/2020   IR ANGIO VERTEBRAL SEL VERTEBRAL UNI R MOD SED  03/16/2020   IR CT HEAD LTD  12/27/2019   IR PERCUTANEOUS ART THROMBECTOMY/INFUSION INTRACRANIAL INC DIAG ANGIO  12/27/2019   IR US GUIDE VASC ACCESS RIGHT  12/27/2019   IR US GUIDE VASC ACCESS RIGHT  03/16/2020   RADIOLOGY WITH ANESTHESIA N/A 12/27/2019   Procedure: IR WITH ANESTHESIA;  Surgeon: Julieanne Cotton, MD;  Location: [facility];  Service: Radiology;  Laterality: N/A;   SCAR REVISION  09/10/2011   Procedure: SCAR REVISION;  Surgeon: Tilda Burrow, MD;  Location: [facility];  Service: Gynecology;  Laterality: N/A;  Wide Excision of old Cicatrix   TUBAL LIGATION  1994   Patient Active Problem List   Diagnosis Date Noted   Acute on chronic systolic CHF (congestive heart failure) (HCC) 11/27/2021   Stage 3b chronic kidney disease (CKD) (HCC)  11/27/2021   Microcytic anemia 11/27/2021   Lactic acidosis 11/27/2021   Diabetes mellitus (HCC) 03/09/2021   Type 2 diabetes mellitus with stage 3a chronic kidney disease, with long-term current use of insulin (HCC) 03/09/2021   Acute metabolic encephalopathy 12/31/2020   Hyperglycemia 12/31/2020   Prolonged QT interval 05/12/2020   History of CVA with residual deficit 05/12/2020   Atrial fibrillation, chronic (HCC) 05/12/2020   Right hemiparesis (HCC)    Essential hypertension    Chronic diastolic congestive heart failure (HCC)    Dysphagia due to recent stroke 01/05/2020   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 01/05/2020   Embolic cerebral infarction [facility]) s/p clot retrieval 12/27/2019   Thoracic aortic aneurysm without rupture (HCC)    Atrial fibrillation with RVR (HCC) 05/10/2019   Type 2 diabetes  mellitus with diabetic autonomic neuropathy, with long-term current use of insulin (HCC) 09/01/2018   Cocaine abuse (HCC) 08/25/2018   Cigarette nicotine dependence, uncomplicated 06/07/2015   GERD (gastroesophageal reflux disease) 06/07/2011   COPD (chronic obstructive pulmonary disease) (HCC) 06/07/2011   Arthritis 06/07/2011    ONSET DATE: May 2021   REFERRING DIAG: R47.01 (ICD-10-CM) - Expressive aphasia  THERAPY DIAG:  Aphasia  Apraxia following other cerebrovascular disease  Cognitive communication deficit  Rationale for Evaluation and Treatment: Rehabilitation  SUBJECTIVE:   SUBJECTIVE STATEMENT: "News"  Pt accompanied by: self and family member  PERTINENT HISTORY: Robin [name] is a pleasant 57 y.o. female with medical history significant for atrial fibrillation on Eliquis, chronic systolic CHF, CKD 3B, insulin-dependent diabetes mellitus, polysubstance abuse, COPD, GERD, HTN, and history of CVA with residual right-sided weakness and aphasia (L MCA CVA and L ICA occlusion s/p thrombectomy on 12/27/2019 followed by CIR stay where she received ST/OT/PT until d/c home on 02/02/20 and then received some [facility] SLP services). She is referred for outpatient SLP therapy by Jarold Motto, PA to facilitate increased independence with communication.  PAIN:  Are you having pain? No  PATIENT GOALS: Increase ability to talk  OBJECTIVE:   DIAGNOSTIC FINDINGS:  MRI 11/13/2020: IMPRESSION: No evidence of acute intracranial abnormality, including acute infarction.   Redemonstrated large chronic left MCA territory infarct.   Redemonstrated small chronic cortically based infarct within the posterior right temporal lobe.   Mild bilateral ethmoid sinus mucosal thickening.  PATIENT EDUCATION: Education details: Herbalist the [employer] educated: Patient and sister, [name] Education method: Explanation and Handouts Education comprehension: verbalized understanding and  needs further education   GOALS: Goals reviewed with patient? Yes  SHORT TERM GOALS: Target date: 08/15/2023  Pt will increase verbal naming of common objects/pictures to 75% acc when provided with mod multimodality cues Baseline: 80% when allowed to write response or provided mod/max cues Goal status: MET for 100% acc for mod/max cues (phonemic cue provided); change to 100% acc with mod cues; met   2.  Pt will describe objects and pictures by providing at least three salient features (single words ok) as judged by clinician with 80% acc when provided mod cues. Baseline: 70% mod/max cues Goal status: MET for 100% with mod/max cues provided; change to 100% acc with mod cues; met   3.  Pt will complete single word sentence completion tasks [facility]) with 90% acc when provided with min/mod multimodality cues.  Baseline: 75% mod cues of initial phoneme modeled Goal status: MET for 100% acc with mi/mod cues provided; change to 100% with min cues; Partially met, d/c  4.  Pt will complete basic level reading  comprehension tasks (picture cues for sentence level) with 80% acc with provided mi/mod cues. Baseline: 100% word to picture matching f=5, 0/5 sentences Goal status: MET for single words, continue for sentences; Partially Met, use on device  5.  Pt will verbally describe action photos with use of "who" "what doing" template with 80% acc and min/mod cues.  Baseline: 70% mod cues. Goal status: MET for 100% acc with mod/max cues; change to 100% with mod cues; Met  6.  Pt will complete personally relevant responsive naming tasks with 90% acc with use of written cues as needed. Baseline: 75% acc Goal status: MET  7. Pt will use a communication device to cue themselves to speak (reading or imitating words) during 1:1 conversations with SLP with min/mod cues over 3 consecutive sessions.             Baseline: Max assist             Goal status: MET; change to min cues   8. Pt will respond to  personal preference questions by answering with alternative communication device with 90% acc and min assist.             Baseline: Max assist             Goal status: Met   9. Pt will use alternative means of communication (low tech and high tech) to maximize independence with communication and find a system that is most efficient for her.             Baseline: Trial of Lingraphica, Spoken App, letter board, etc             Goal status: MET/ONGOING   10. Pt will use alternative means of communication to locate list of medications, health symptoms, and personal/bio information with indirect cues.             Baseline: mod assist             Goal status: Partially Met/Continue with updated medications  11. Pt will reduce social isolation by using the device to share opinions, tell stories, and  maintain/develop social connections with indirect cues  Baseline: min/mod assist             Goal status: ONGOING  LONG TERM GOALS: Target date: 08/15/2023  Pt will communicate moderately complex wants/needs to [facility] with min assist from communication partner and use of multimodality communication strategies. Baseline: Pt requires mod assist from communication partner  Goal status: ONGOING   ASSESSMENT:  PREVIOUS TREATMENT:  Pt accompanied to therapy by Mae. She received her permanent Lingraphica device and we spent the session identifying which photos transferred and what needed to be added. She was able to use the new device to select icons to alert SLP which photos needed to be updated. She also requested that new icons be added to help her order meals form [employer]. She demonstrated navigation of the device with min cues from SLP and some modeling provided. SLP observed Harriett Sine communicating with front office staff to request that her transportation be called and that she receive a copy of her schedule. We rehearsed this in treatment room prior to talking with staff. Next session, assist Thyra in  placing folders in most efficient manner to facilitate use of the device with novel communication partners.  CURRENT TREATMENT:  Pt accompanied to therapy by Mae. SLP showed Pt how to add and edit pictures for each icon (some did not carryover from previous device) and  she was able to return demonstrate with min cues. She benefits from following a flow chart of which buttons to select, for example: Talk>Food>Restaurant>[employer]. She then added "chicken nuggets" in the [employer] folder and requested to have that for lunch. SLP asked if she had any questions or wanted anything else added and she wrote "NEWS" on paper and we found the [facility] web page to add. She located the icon to request that transportation be called. She was encouraged to go through all of the icons and folders to see if she wants anything adjusted next session. She stated that she does not want "My Chart" added and will continue to have her sister manage that. I would like to see her for a session with her sister present before discharge.                                                                                                       DATE: 07/17/23  CLINICAL IMPRESSION: (from initial evaluation on 03/19/2023) Patient is a 57 y.o. female who was seen today for speech/language evaluation s/p CVA in May of 2021 with only limited follow up SLP therapy via home health in 2021 when she was staying with her daughter. [name] sister accompanied her to the evaluation and would like for her sister to be able to communicate her wants and needs verbally. 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 Pt has difficulty verbally  labeling high frequency objects (but was able to write some responses), repeating longer words/phrases, and reading phrases. She was able to match 5/5 high frequency objects to single words, but was only able to orally read/label 1/5 words. Pt benefited from gesturing to communicate her intent and this occasionally helped elicit word production and she also benefited from SLP phonemic cues and single word sentence completion cues. Recommend SLP therapy to address communication deficits and increase independence with communication 2x/week for 4-8 weeks.  OBJECTIVE IMPAIRMENTS: include executive functioning, expressive language, receptive language, aphasia, and apraxia. These impairments are limiting patient from managing medications, managing appointments, managing finances, and effectively communicating at home and in community. Factors affecting potential to achieve goals and functional outcome are ability to learn/carryover information, severity of impairments, and time post onset of aphasia (2021) . Patient will benefit from skilled SLP services to address above impairments and improve overall function.  REHAB POTENTIAL: Good  PLAN:  SLP FREQUENCY: 2x/week  SLP DURATION: 8 weeks  PLANNED INTERVENTIONS: Cueing hierachy, Internal/external aids, Functional tasks, Multimodal communication approach, SLP instruction and feedback, Compensatory strategies, Patient/family education, and Re-evaluation    Thank you,  Havery Moros, CCC-SLP [phone]  Katricia Prehn, CCC-SLP 07/17/2023, 10:48 AM

## 2023-07-21 ENCOUNTER — Encounter (HOSPITAL_COMMUNITY): Payer: Self-pay | Admitting: Speech Pathology

## 2023-07-21 ENCOUNTER — Ambulatory Visit (HOSPITAL_COMMUNITY): Payer: 59 | Admitting: Speech Pathology

## 2023-07-21 DIAGNOSIS — R4701 Aphasia: Secondary | ICD-10-CM

## 2023-07-21 DIAGNOSIS — I69890 Apraxia following other cerebrovascular disease: Secondary | ICD-10-CM

## 2023-07-21 DIAGNOSIS — R41841 Cognitive communication deficit: Secondary | ICD-10-CM

## 2023-07-21 NOTE — Therapy (Signed)
OUTPATIENT SPEECH LANGUAGE PATHOLOGY TREATMENT   Patient Name: Robin [name] MRN: [id number] DOB:07-11-1966, 57 y.o., female Today's Date: 07/21/2023  PCP: Jarold Motto, PA REFERRING PROVIDER: Jarold Motto, PA  END OF SESSION:  End of Session - 07/21/23 1134     Visit Number 25    Number of Visits 30    Date for SLP Re-Evaluation 08/14/23    Authorization Type [employer] Medicare and Medicaid secondary; Dual complete   eff 08/05/22 ded 240 met oop 8850 met 2034.20 limit-no auth-no co ins-20%   SLP Start Time 1015    SLP Stop Time  1115    SLP Time Calculation (min) 60 min    Activity Tolerance Patient tolerated treatment well             Past Medical History:  Diagnosis Date   Allergy    Anemia    Aortic stenosis    a. 02/2023 Echo: mild-mod AS.   Arthritis    Chronic abdominal pain    Chronic HFrEF (heart failure with reduced ejection fraction) (HCC)    a. EF at 30-35% by echo in 08/2018 b. EF at 35% by repeat echo in 05/2019 c. EF improved to 65-70% in 2021 d. EF at 40-45% in 08/2021; e. 02/2023 Echo: 40-45%, glob HK, mod LVH, nl RV size/fx, sev BAE, mild-mod AS.   Cocaine abuse (HCC)    COPD (chronic obstructive pulmonary disease) (HCC)    Diabetes mellitus without complication (HCC)    Essential hypertension, benign    Expressive aphasia    post CVA   Fatty liver    GERD (gastroesophageal reflux disease)    Gout 2016   Normal coronary arteries    a. 10/2008 abnl MV; b. 10/2008 Cath: nl cors.   Ovarian cyst    Permanent atrial fibrillation (HCC)    Stroke (HCC) 12/26/2019   Right sided weakness, and expressive aphasia   Thoracic ascending aortic aneurysm (HCC)    a. 4 cm 10/31/19 CTA; b. 02/2023 Echo: Nl Ao root.   Type 2 diabetes mellitus (HCC)    type II   Past Surgical History:  Procedure Laterality Date   ABDOMINAL HYSTERECTOMY  09/10/2011   Procedure: HYSTERECTOMY ABDOMINAL;  Surgeon: Tilda Burrow, MD;  Location: [facility];  Service: Gynecology;   Laterality: N/A;  Abdominal hysterectomy   CESAREAN SECTION  [phone], and 1994   CHOLECYSTECTOMY  1995   IR 3D INDEPENDENT WKST  03/16/2020   IR ANGIO INTRA EXTRACRAN SEL INTERNAL CAROTID BILAT MOD SED  03/16/2020   IR ANGIO VERTEBRAL SEL SUBCLAVIAN INNOMINATE UNI L MOD SED  03/16/2020   IR ANGIO VERTEBRAL SEL VERTEBRAL UNI R MOD SED  03/16/2020   IR CT HEAD LTD  12/27/2019   IR PERCUTANEOUS ART THROMBECTOMY/INFUSION INTRACRANIAL INC DIAG ANGIO  12/27/2019   IR US GUIDE VASC ACCESS RIGHT  12/27/2019   IR US GUIDE VASC ACCESS RIGHT  03/16/2020   RADIOLOGY WITH ANESTHESIA N/A 12/27/2019   Procedure: IR WITH ANESTHESIA;  Surgeon: Julieanne Cotton, MD;  Location: [facility];  Service: Radiology;  Laterality: N/A;   SCAR REVISION  09/10/2011   Procedure: SCAR REVISION;  Surgeon: Tilda Burrow, MD;  Location: [facility];  Service: Gynecology;  Laterality: N/A;  Wide Excision of old Cicatrix   TUBAL LIGATION  1994   Patient Active Problem List   Diagnosis Date Noted   Acute on chronic systolic CHF (congestive heart failure) (HCC) 11/27/2021   Stage 3b chronic kidney disease (CKD) (HCC)  11/27/2021   Microcytic anemia 11/27/2021   Lactic acidosis 11/27/2021   Diabetes mellitus (HCC) 03/09/2021   Type 2 diabetes mellitus with stage 3a chronic kidney disease, with long-term current use of insulin (HCC) 03/09/2021   Acute metabolic encephalopathy 12/31/2020   Hyperglycemia 12/31/2020   Prolonged QT interval 05/12/2020   History of CVA with residual deficit 05/12/2020   Atrial fibrillation, chronic (HCC) 05/12/2020   Right hemiparesis (HCC)    Essential hypertension    Chronic diastolic congestive heart failure (HCC)    Dysphagia due to recent stroke 01/05/2020   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 01/05/2020   Embolic cerebral infarction [facility]) s/p clot retrieval 12/27/2019   Thoracic aortic aneurysm without rupture (HCC)    Atrial fibrillation with RVR (HCC) 05/10/2019   Type 2 diabetes  mellitus with diabetic autonomic neuropathy, with long-term current use of insulin (HCC) 09/01/2018   Cocaine abuse (HCC) 08/25/2018   Cigarette nicotine dependence, uncomplicated 06/07/2015   GERD (gastroesophageal reflux disease) 06/07/2011   COPD (chronic obstructive pulmonary disease) (HCC) 06/07/2011   Arthritis 06/07/2011    ONSET DATE: May 2021   REFERRING DIAG: R47.01 (ICD-10-CM) - Expressive aphasia  THERAPY DIAG:  Aphasia  Apraxia following other cerebrovascular disease  Cognitive communication deficit  Rationale for Evaluation and Treatment: Rehabilitation  SUBJECTIVE:   SUBJECTIVE STATEMENT: "Thank you!"  Pt accompanied by: self and family member  PERTINENT HISTORY: Robin [name] is a pleasant 57 y.o. female with medical history significant for atrial fibrillation on Eliquis, chronic systolic CHF, CKD 3B, insulin-dependent diabetes mellitus, polysubstance abuse, COPD, GERD, HTN, and history of CVA with residual right-sided weakness and aphasia (L MCA CVA and L ICA occlusion s/p thrombectomy on 12/27/2019 followed by CIR stay where she received ST/OT/PT until d/c home on 02/02/20 and then received some [facility] SLP services). She is referred for outpatient SLP therapy by Jarold Motto, PA to facilitate increased independence with communication.  PAIN:  Are you having pain? No  PATIENT GOALS: Increase ability to talk  OBJECTIVE:   DIAGNOSTIC FINDINGS:  MRI 11/13/2020: IMPRESSION: No evidence of acute intracranial abnormality, including acute infarction.   Redemonstrated large chronic left MCA territory infarct.   Redemonstrated small chronic cortically based infarct within the posterior right temporal lobe.   Mild bilateral ethmoid sinus mucosal thickening.  PATIENT EDUCATION: Education details: Herbalist the [employer] educated: Patient and sister, [name] Education method: Explanation and Handouts Education comprehension: verbalized  understanding and needs further education   GOALS: Goals reviewed with patient? Yes  SHORT TERM GOALS: Target date: 08/15/2023  Pt will increase verbal naming of common objects/pictures to 75% acc when provided with mod multimodality cues Baseline: 80% when allowed to write response or provided mod/max cues Goal status: MET for 100% acc for mod/max cues (phonemic cue provided); change to 100% acc with mod cues; met   2.  Pt will describe objects and pictures by providing at least three salient features (single words ok) as judged by clinician with 80% acc when provided mod cues. Baseline: 70% mod/max cues Goal status: MET for 100% with mod/max cues provided; change to 100% acc with mod cues; met   3.  Pt will complete single word sentence completion tasks [facility]) with 90% acc when provided with min/mod multimodality cues.  Baseline: 75% mod cues of initial phoneme modeled Goal status: MET for 100% acc with mi/mod cues provided; change to 100% with min cues; Partially met, d/c  4.  Pt will complete basic level  reading comprehension tasks (picture cues for sentence level) with 80% acc with provided mi/mod cues. Baseline: 100% word to picture matching f=5, 0/5 sentences Goal status: MET for single words, continue for sentences; Partially Met, use on device  5.  Pt will verbally describe action photos with use of "who" "what doing" template with 80% acc and min/mod cues.  Baseline: 70% mod cues. Goal status: MET for 100% acc with mod/max cues; change to 100% with mod cues; Met  6.  Pt will complete personally relevant responsive naming tasks with 90% acc with use of written cues as needed. Baseline: 75% acc Goal status: MET  7. Pt will use a communication device to cue themselves to speak (reading or imitating words) during 1:1 conversations with SLP with min/mod cues over 3 consecutive sessions.             Baseline: Max assist             Goal status: MET; change to min cues   8. Pt  will respond to personal preference questions by answering with alternative communication device with 90% acc and min assist.             Baseline: Max assist             Goal status: Met   9. Pt will use alternative means of communication (low tech and high tech) to maximize independence with communication and find a system that is most efficient for her.             Baseline: Trial of Lingraphica, Spoken App, letter board, etc             Goal status: MET/ONGOING   10. Pt will use alternative means of communication to locate list of medications, health symptoms, and personal/bio information with indirect cues.             Baseline: mod assist             Goal status: Partially Met/Continue with updated medications  11. Pt will reduce social isolation by using the device to share opinions, tell stories, and  maintain/develop social connections with indirect cues  Baseline: min/mod assist             Goal status: ONGOING  LONG TERM GOALS: Target date: 08/15/2023  Pt will communicate moderately complex wants/needs to [facility] with min assist from communication partner and use of multimodality communication strategies. Baseline: Pt requires mod assist from communication partner  Goal status: ONGOING   ASSESSMENT:  PREVIOUS TREATMENT: Pt accompanied to therapy by Mae. SLP showed Pt how to add and edit pictures for each icon (some did not carryover from previous device) and she was able to return demonstrate with min cues. She benefits from following a flow chart of which buttons to select, for example: Talk>Food>Restaurant>[employer]. She then added "chicken nuggets" in the [employer] folder and requested to have that for lunch. SLP asked if she had any questions or wanted anything else added and she wrote "NEWS" on paper and we found the [facility] web page to add. She located the icon to request that transportation be called. She was encouraged to go through all of the icons and folders to see if she  wants anything adjusted next session. She stated that she does not want "My Chart" added and will continue to have her sister manage that. I would like to see her for a session with her sister present before discharge.   CURRENT TREATMENT:  Pt unaccompanied to therapy today. She independently opened her Lingraphica to the "News" section to show SLP what she reviewed over the weekend. Pt was asked to review what other information she would like added to her Lingraphica and she indicated that she wanted her medications and medical history.  SLP facilitated entry of each medication and "medical concerns" once identified by Pt from written cue. She was able to select each medication to add and then record once it was entered. She verbalized responses to open and closed ended questions regarding method of transportation and caregivers. Next session, plan to have Pt participate in conversation using the newly added information to Lingraphica to mimic use in home and community setting.                                                                                                       DATE: 07/21/23  CLINICAL IMPRESSION: (from initial evaluation on 03/19/2023) Patient is a 57 y.o. female who was seen today for speech/language evaluation s/p CVA in May of 2021 with only limited follow up SLP therapy via home health in 2021 when she was staying with her daughter. [name] sister accompanied her to the evaluation and would like for her sister to be able to communicate her wants and needs verbally. 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 Pt has difficulty verbally labeling high frequency  objects (but was able to write some responses), repeating longer words/phrases, and reading phrases. She was able to match 5/5 high frequency objects to single words, but was only able to orally read/label 1/5 words. Pt benefited from gesturing to communicate her intent and this occasionally helped elicit word production and she also benefited from SLP phonemic cues and single word sentence completion cues. Recommend SLP therapy to address communication deficits and increase independence with communication 2x/week for 4-8 weeks.  OBJECTIVE IMPAIRMENTS: include executive functioning, expressive language, receptive language, aphasia, and apraxia. These impairments are limiting patient from managing medications, managing appointments, managing finances, and effectively communicating at home and in community. Factors affecting potential to achieve goals and functional outcome are ability to learn/carryover information, severity of impairments, and time post onset of aphasia (2021) . Patient will benefit from skilled SLP services to address above impairments and improve overall function.  REHAB POTENTIAL: Good  PLAN:  SLP FREQUENCY: 2x/week  SLP DURATION: 8 weeks  PLANNED INTERVENTIONS: Cueing hierachy, Internal/external aids, Functional tasks, Multimodal communication approach, SLP instruction and feedback, Compensatory strategies, Patient/family education, and Re-evaluation    Thank you,  Havery Moros, CCC-SLP [phone]  Abdifatah Colquhoun, CCC-SLP 07/21/2023, 11:35 AM

## 2023-07-23 ENCOUNTER — Encounter (HOSPITAL_COMMUNITY): Payer: 59 | Admitting: Speech Pathology

## 2023-07-25 ENCOUNTER — Other Ambulatory Visit: Payer: Self-pay | Admitting: Physician Assistant

## 2023-07-28 ENCOUNTER — Encounter (HOSPITAL_COMMUNITY): Payer: Self-pay | Admitting: Speech Pathology

## 2023-07-28 ENCOUNTER — Ambulatory Visit (HOSPITAL_COMMUNITY): Payer: 59 | Admitting: Speech Pathology

## 2023-07-28 DIAGNOSIS — R4701 Aphasia: Secondary | ICD-10-CM

## 2023-07-28 DIAGNOSIS — R41841 Cognitive communication deficit: Secondary | ICD-10-CM

## 2023-07-28 DIAGNOSIS — I69890 Apraxia following other cerebrovascular disease: Secondary | ICD-10-CM

## 2023-07-28 NOTE — Therapy (Signed)
OUTPATIENT SPEECH LANGUAGE PATHOLOGY TREATMENT   Patient Name: Robin [name] MRN: [id number] DOB:1966/06/28, 57 y.o., female Today's Date: 07/28/2023  PCP: Jarold Motto, PA REFERRING PROVIDER: Jarold Motto, PA  END OF SESSION:  End of Session - 07/28/23 0943     Visit Number 26    Number of Visits 30    Date for SLP Re-Evaluation 08/14/23    Authorization Type [employer] Medicare and Medicaid secondary; Dual complete   eff 08/05/22 ded 240 met oop 8850 met 2034.20 limit-no auth-no co ins-20%   SLP Start Time 0855    SLP Stop Time  0942    SLP Time Calculation (min) 47 min    Activity Tolerance Patient tolerated treatment well             Past Medical History:  Diagnosis Date   Allergy    Anemia    Aortic stenosis    a. 02/2023 Echo: mild-mod AS.   Arthritis    Chronic abdominal pain    Chronic HFrEF (heart failure with reduced ejection fraction) (HCC)    a. EF at 30-35% by echo in 08/2018 b. EF at 35% by repeat echo in 05/2019 c. EF improved to 65-70% in 2021 d. EF at 40-45% in 08/2021; e. 02/2023 Echo: 40-45%, glob HK, mod LVH, nl RV size/fx, sev BAE, mild-mod AS.   Cocaine abuse (HCC)    COPD (chronic obstructive pulmonary disease) (HCC)    Diabetes mellitus without complication (HCC)    Essential hypertension, benign    Expressive aphasia    post CVA   Fatty liver    GERD (gastroesophageal reflux disease)    Gout 2016   Normal coronary arteries    a. 10/2008 abnl MV; b. 10/2008 Cath: nl cors.   Ovarian cyst    Permanent atrial fibrillation (HCC)    Stroke (HCC) 12/26/2019   Right sided weakness, and expressive aphasia   Thoracic ascending aortic aneurysm (HCC)    a. 4 cm 10/31/19 CTA; b. 02/2023 Echo: Nl Ao root.   Type 2 diabetes mellitus (HCC)    type II   Past Surgical History:  Procedure Laterality Date   ABDOMINAL HYSTERECTOMY  09/10/2011   Procedure: HYSTERECTOMY ABDOMINAL;  Surgeon: Tilda Burrow, MD;  Location: [facility];  Service: Gynecology;   Laterality: N/A;  Abdominal hysterectomy   CESAREAN SECTION  [phone], and 1994   CHOLECYSTECTOMY  1995   IR 3D INDEPENDENT WKST  03/16/2020   IR ANGIO INTRA EXTRACRAN SEL INTERNAL CAROTID BILAT MOD SED  03/16/2020   IR ANGIO VERTEBRAL SEL SUBCLAVIAN INNOMINATE UNI L MOD SED  03/16/2020   IR ANGIO VERTEBRAL SEL VERTEBRAL UNI R MOD SED  03/16/2020   IR CT HEAD LTD  12/27/2019   IR PERCUTANEOUS ART THROMBECTOMY/INFUSION INTRACRANIAL INC DIAG ANGIO  12/27/2019   IR US GUIDE VASC ACCESS RIGHT  12/27/2019   IR US GUIDE VASC ACCESS RIGHT  03/16/2020   RADIOLOGY WITH ANESTHESIA N/A 12/27/2019   Procedure: IR WITH ANESTHESIA;  Surgeon: Julieanne Cotton, MD;  Location: [facility];  Service: Radiology;  Laterality: N/A;   SCAR REVISION  09/10/2011   Procedure: SCAR REVISION;  Surgeon: Tilda Burrow, MD;  Location: [facility];  Service: Gynecology;  Laterality: N/A;  Wide Excision of old Cicatrix   TUBAL LIGATION  1994   Patient Active Problem List   Diagnosis Date Noted   Acute on chronic systolic CHF (congestive heart failure) (HCC) 11/27/2021   Stage 3b chronic kidney disease (CKD) (HCC)  11/27/2021   Microcytic anemia 11/27/2021   Lactic acidosis 11/27/2021   Diabetes mellitus (HCC) 03/09/2021   Type 2 diabetes mellitus with stage 3a chronic kidney disease, with long-term current use of insulin (HCC) 03/09/2021   Acute metabolic encephalopathy 12/31/2020   Hyperglycemia 12/31/2020   Prolonged QT interval 05/12/2020   History of CVA with residual deficit 05/12/2020   Atrial fibrillation, chronic (HCC) 05/12/2020   Right hemiparesis (HCC)    Essential hypertension    Chronic diastolic congestive heart failure (HCC)    Dysphagia due to recent stroke 01/05/2020   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 01/05/2020   Embolic cerebral infarction [facility]) s/p clot retrieval 12/27/2019   Thoracic aortic aneurysm without rupture (HCC)    Atrial fibrillation with RVR (HCC) 05/10/2019   Type 2 diabetes  mellitus with diabetic autonomic neuropathy, with long-term current use of insulin (HCC) 09/01/2018   Cocaine abuse (HCC) 08/25/2018   Cigarette nicotine dependence, uncomplicated 06/07/2015   GERD (gastroesophageal reflux disease) 06/07/2011   COPD (chronic obstructive pulmonary disease) (HCC) 06/07/2011   Arthritis 06/07/2011    ONSET DATE: May 2021   REFERRING DIAG: R47.01 (ICD-10-CM) - Expressive aphasia  THERAPY DIAG:  Aphasia  Apraxia following other cerebrovascular disease  Cognitive communication deficit  Rationale for Evaluation and Treatment: Rehabilitation  SUBJECTIVE:   SUBJECTIVE STATEMENT: "Today!"  Pt accompanied by: self and family member  PERTINENT HISTORY: Robin [name] is a pleasant 57 y.o. female with medical history significant for atrial fibrillation on Eliquis, chronic systolic CHF, CKD 3B, insulin-dependent diabetes mellitus, polysubstance abuse, COPD, GERD, HTN, and history of CVA with residual right-sided weakness and aphasia (L MCA CVA and L ICA occlusion s/p thrombectomy on 12/27/2019 followed by CIR stay where she received ST/OT/PT until d/c home on 02/02/20 and then received some [facility] SLP services). She is referred for outpatient SLP therapy by Jarold Motto, PA to facilitate increased independence with communication.  PAIN:  Are you having pain? No  PATIENT GOALS: Increase ability to talk  OBJECTIVE:   DIAGNOSTIC FINDINGS:  MRI 11/13/2020: IMPRESSION: No evidence of acute intracranial abnormality, including acute infarction.   Redemonstrated large chronic left MCA territory infarct.   Redemonstrated small chronic cortically based infarct within the posterior right temporal lobe.   Mild bilateral ethmoid sinus mucosal thickening.  PATIENT EDUCATION: Education details: Herbalist the [employer] educated: Patient and sister, Robin [name] Education method: Explanation and Handouts Education comprehension: verbalized understanding  and needs further education   GOALS: Goals reviewed with patient? Yes  SHORT TERM GOALS: Target date: 08/15/2023  Pt will increase verbal naming of common objects/pictures to 75% acc when provided with mod multimodality cues Baseline: 80% when allowed to write response or provided mod/max cues Goal status: MET for 100% acc for mod/max cues (phonemic cue provided); change to 100% acc with mod cues; met   2.  Pt will describe objects and pictures by providing at least three salient features (single words ok) as judged by clinician with 80% acc when provided mod cues. Baseline: 70% mod/max cues Goal status: MET for 100% with mod/max cues provided; change to 100% acc with mod cues; met   3.  Pt will complete single word sentence completion tasks [facility]) with 90% acc when provided with min/mod multimodality cues.  Baseline: 75% mod cues of initial phoneme modeled Goal status: MET for 100% acc with mi/mod cues provided; change to 100% with min cues; Partially met, d/c  4.  Pt will complete basic level reading  comprehension tasks (picture cues for sentence level) with 80% acc with provided mi/mod cues. Baseline: 100% word to picture matching f=5, 0/5 sentences Goal status: MET for single words, continue for sentences; Partially Met, use on device  5.  Pt will verbally describe action photos with use of "who" "what doing" template with 80% acc and min/mod cues.  Baseline: 70% mod cues. Goal status: MET for 100% acc with mod/max cues; change to 100% with mod cues; Met  6.  Pt will complete personally relevant responsive naming tasks with 90% acc with use of written cues as needed. Baseline: 75% acc Goal status: MET  7. Pt will use a communication device to cue themselves to speak (reading or imitating words) during 1:1 conversations with SLP with min/mod cues over 3 consecutive sessions.             Baseline: Max assist             Goal status: MET; change to min cues   8. Pt will respond to  personal preference questions by answering with alternative communication device with 90% acc and min assist.             Baseline: Max assist             Goal status: Met   9. Pt will use alternative means of communication (low tech and high tech) to maximize independence with communication and find a system that is most efficient for her.             Baseline: Trial of Lingraphica, Spoken App, letter board, etc             Goal status: MET/ONGOING   10. Pt will use alternative means of communication to locate list of medications, health symptoms, and personal/bio information with indirect cues.             Baseline: mod assist             Goal status: Partially Met/Continue with updated medications  11. Pt will reduce social isolation by using the device to share opinions, tell stories, and  maintain/develop social connections with indirect cues  Baseline: min/mod assist             Goal status: ONGOING  LONG TERM GOALS: Target date: 08/15/2023  Pt will communicate moderately complex wants/needs to [facility] with min assist from communication partner and use of multimodality communication strategies. Baseline: Pt requires mod assist from communication partner  Goal status: ONGOING   ASSESSMENT:  PREVIOUS TREATMENT: Pt unaccompanied to therapy today. She independently opened her Lingraphica to the "News" section to show SLP what she reviewed over the weekend. Pt was asked to review what other information she would like added to her Lingraphica and she indicated that she wanted her medications and medical history.  SLP facilitated entry of each medication and "medical concerns" once identified by Pt from written cue. She was able to select each medication to add and then record once it was entered. She verbalized responses to open and closed ended questions regarding method of transportation and caregivers. Next session, plan to have Pt participate in conversation using the newly added information  to Lingraphica to mimic use in home and community setting.  CURRENT TREATMENT:  Pt accompanied to therapy by Mae today. Session focused on continued independence with device personalization and navigation. She was able to show me what she worked on at home (the News section and some therapy). We added folders for  some family members and added additional information about them. She was able to navigate the device to locate responses to SLP questions with indirect cues. Pt used the "white board" function to write responses to ages in her family. She is not yet asking questions with her device and we will address next session. Continue with use at home and have sister attend therapy session prior to d/c.                                                                                                      DATE: 07/28/23  CLINICAL IMPRESSION: (from initial evaluation on 03/19/2023) Patient is a 57 y.o. female who was seen today for speech/language evaluation s/p CVA in May of 2021 with only limited follow up SLP therapy via home health in 2021 when she was staying with her daughter. [name] sister accompanied her to the evaluation and would like for her sister to be able to communicate her wants and needs verbally. 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 Pt has difficulty verbally labeling high frequency objects (but was able to write some responses), repeating longer words/phrases, and reading phrases. She was able to match 5/5 high frequency objects to single words, but was only able to orally read/label 1/5 words. Pt benefited from gesturing to communicate her intent and this  occasionally helped elicit word production and she also benefited from SLP phonemic cues and single word sentence completion cues. Recommend SLP therapy to address communication deficits and increase independence with communication 2x/week for 4-8 weeks.  OBJECTIVE IMPAIRMENTS: include executive functioning, expressive language, receptive language, aphasia, and apraxia. These impairments are limiting patient from managing medications, managing appointments, managing finances, and effectively communicating at home and in community. Factors affecting potential to achieve goals and functional outcome are ability to learn/carryover information, severity of impairments, and time post onset of aphasia (2021) . Patient will benefit from skilled SLP services to address above impairments and improve overall function.  REHAB POTENTIAL: Good  PLAN:  SLP FREQUENCY: 2x/week  SLP DURATION: 8 weeks  PLANNED INTERVENTIONS: Cueing hierachy, Internal/external aids, Functional tasks, Multimodal communication approach, SLP instruction and feedback, Compensatory strategies, Patient/family education, and Re-evaluation    Thank you,  Havery Moros, CCC-SLP [phone]  Jailene Cupit, CCC-SLP 07/28/2023, 9:46 AM

## 2023-07-31 ENCOUNTER — Encounter (HOSPITAL_COMMUNITY): Payer: 59 | Admitting: Speech Pathology

## 2023-08-14 ENCOUNTER — Other Ambulatory Visit: Payer: Self-pay | Admitting: Physician Assistant

## 2023-08-14 ENCOUNTER — Ambulatory Visit (HOSPITAL_COMMUNITY): Payer: 59 | Attending: Physician Assistant | Admitting: Speech Pathology

## 2023-08-14 ENCOUNTER — Encounter (HOSPITAL_COMMUNITY): Payer: Self-pay | Admitting: Speech Pathology

## 2023-08-14 DIAGNOSIS — I69890 Apraxia following other cerebrovascular disease: Secondary | ICD-10-CM | POA: Diagnosis present

## 2023-08-14 DIAGNOSIS — R4701 Aphasia: Secondary | ICD-10-CM

## 2023-08-14 DIAGNOSIS — R41841 Cognitive communication deficit: Secondary | ICD-10-CM

## 2023-08-14 NOTE — Therapy (Signed)
 OUTPATIENT SPEECH LANGUAGE PATHOLOGY TREATMENT   Patient Name: Robin [name] MRN: [id number] DOB:Oct 25, 1965, 58 y.o., female Today's Date: 08/14/2023  PCP: Job Lukes, PA REFERRING PROVIDER: Job Lukes, PA  END OF SESSION:  End of Session - 08/14/23 0951     Visit Number 27    Number of Visits 34    Date for SLP Re-Evaluation 09/15/23    Authorization Type UHC Medicare and Medicaid secondary; Dual complete   eff 08/05/22 ded 240 met oop 8850 met 2034.20 limit-no auth-no co ins-20%   SLP Start Time 0935    SLP Stop Time  1020    SLP Time Calculation (min) 45 min    Activity Tolerance Patient tolerated treatment well             Past Medical History:  Diagnosis Date   Allergy    Anemia    Aortic stenosis    a. 02/2023 Echo: mild-mod AS.   Arthritis    Chronic abdominal pain    Chronic HFrEF (heart failure with reduced ejection fraction) (HCC)    a. EF at 30-35% by echo in 08/2018 b. EF at 35% by repeat echo in 05/2019 c. EF improved to 65-70% in 2021 d. EF at 40-45% in 08/2021; e. 02/2023 Echo: 40-45%, glob HK, mod LVH, nl RV size/fx, sev BAE, mild-mod AS.   Cocaine abuse (HCC)    COPD (chronic obstructive pulmonary disease) (HCC)    Diabetes mellitus without complication (HCC)    Essential hypertension, benign    Expressive aphasia    post CVA   Fatty liver    GERD (gastroesophageal reflux disease)    Gout 2016   Normal coronary arteries    a. 10/2008 abnl MV; b. 10/2008 Cath: nl cors.   Ovarian cyst    Permanent atrial fibrillation (HCC)    Stroke (HCC) 12/26/2019   Right sided weakness, and expressive aphasia   Thoracic ascending aortic aneurysm (HCC)    a. 4 cm 10/31/19 CTA; b. 02/2023 Echo: Nl Ao root.   Type 2 diabetes mellitus (HCC)    type II   Past Surgical History:  Procedure Laterality Date   ABDOMINAL HYSTERECTOMY  09/10/2011   Procedure: HYSTERECTOMY ABDOMINAL;  Surgeon: Norleen LULLA Server, MD;  Location: [facility];  Service: Gynecology;   Laterality: N/A;  Abdominal hysterectomy   CESAREAN SECTION  [id number], and 1994   CHOLECYSTECTOMY  1995   IR 3D INDEPENDENT WKST  03/16/2020   IR ANGIO INTRA EXTRACRAN SEL INTERNAL CAROTID BILAT MOD SED  03/16/2020   IR ANGIO VERTEBRAL SEL SUBCLAVIAN INNOMINATE UNI L MOD SED  03/16/2020   IR ANGIO VERTEBRAL SEL VERTEBRAL UNI R MOD SED  03/16/2020   IR CT HEAD LTD  12/27/2019   IR PERCUTANEOUS ART THROMBECTOMY/INFUSION INTRACRANIAL INC DIAG ANGIO  12/27/2019   IR US  GUIDE VASC ACCESS RIGHT  12/27/2019   IR US  GUIDE VASC ACCESS RIGHT  03/16/2020   RADIOLOGY WITH ANESTHESIA N/A 12/27/2019   Procedure: IR WITH ANESTHESIA;  Surgeon: Dolphus Carrion, MD;  Location: [facility];  Service: Radiology;  Laterality: N/A;   SCAR REVISION  09/10/2011   Procedure: SCAR REVISION;  Surgeon: Norleen LULLA Server, MD;  Location: [facility];  Service: Gynecology;  Laterality: N/A;  Wide Excision of old Cicatrix   TUBAL LIGATION  1994   Patient Active Problem List   Diagnosis Date Noted   Acute on chronic systolic CHF (congestive heart failure) (HCC) 11/27/2021   Stage 3b chronic kidney disease (CKD) (HCC)  11/27/2021   Microcytic anemia 11/27/2021   Lactic acidosis 11/27/2021   Diabetes mellitus (HCC) 03/09/2021   Type 2 diabetes mellitus with stage 3a chronic kidney disease, with long-term current use of insulin  (HCC) 03/09/2021   Acute metabolic encephalopathy 12/31/2020   Hyperglycemia 12/31/2020   Prolonged QT interval 05/12/2020   History of CVA with residual deficit 05/12/2020   Atrial fibrillation, chronic (HCC) 05/12/2020   Right hemiparesis (HCC)    Essential hypertension    Chronic diastolic congestive heart failure (HCC)    Dysphagia due to recent stroke 01/05/2020   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 01/05/2020   Embolic cerebral infarction [facility]) s/p clot retrieval 12/27/2019   Thoracic aortic aneurysm without rupture (HCC)    Atrial fibrillation with RVR (HCC) 05/10/2019   Type 2 diabetes  mellitus with diabetic autonomic neuropathy, with long-term current use of insulin  (HCC) 09/01/2018   Cocaine abuse (HCC) 08/25/2018   Cigarette nicotine dependence, uncomplicated 06/07/2015   GERD (gastroesophageal reflux disease) 06/07/2011   COPD (chronic obstructive pulmonary disease) (HCC) 06/07/2011   Arthritis 06/07/2011    ONSET DATE: May 2021   REFERRING DIAG: R47.01 (ICD-10-CM) - Expressive aphasia  THERAPY DIAG:  Aphasia  Apraxia following other cerebrovascular disease  Cognitive communication deficit  Rationale for Evaluation and Treatment: Rehabilitation  SUBJECTIVE:   SUBJECTIVE STATEMENT: I mean my sister.  Pt accompanied by: self and caregiver  PERTINENT HISTORY: Robin [name] is a pleasant 58 y.o. female with medical history significant for atrial fibrillation on Eliquis , chronic systolic CHF, CKD 3B, insulin -dependent diabetes mellitus, polysubstance abuse, COPD, GERD, HTN, and history of CVA with residual right-sided weakness and aphasia (L MCA CVA and L ICA occlusion s/p thrombectomy on 12/27/2019 followed by CIR stay where she received ST/OT/PT until d/c home on 02/02/20 and then received some [facility] SLP services). She is referred for outpatient SLP therapy by Lucie Buttner, PA to facilitate increased independence with communication.  PAIN:  Are you having pain? No  PATIENT GOALS: Increase ability to talk  OBJECTIVE:   DIAGNOSTIC FINDINGS:  MRI 11/13/2020: IMPRESSION: No evidence of acute intracranial abnormality, including acute infarction.   Redemonstrated large chronic left MCA territory infarct.   Redemonstrated small chronic cortically based infarct within the posterior right temporal lobe.   Mild bilateral ethmoid sinus mucosal thickening.  PATIENT EDUCATION: Education details: Herbalist the [employer] educated: Patient and sister, [name] Education method: Explanation and Handouts Education comprehension: verbalized  understanding and needs further education   GOALS: Goals reviewed with patient? Yes  SHORT TERM GOALS: Target date: 09/15/2023  Pt will increase verbal naming of common objects/pictures to 75% acc when provided with mod multimodality cues Baseline: 80% when allowed to write response or provided mod/max cues Goal status: MET for 100% acc for mod/max cues (phonemic cue provided); change to 100% acc with mod cues; met   2.  Pt will describe objects and pictures by providing at least three salient features (single words ok) as judged by clinician with 80% acc when provided mod cues. Baseline: 70% mod/max cues Goal status: MET for 100% with mod/max cues provided; change to 100% acc with mod cues; met   3.  Pt will complete single word sentence completion tasks [facility] with 90% acc when provided with min/mod multimodality cues.  Baseline: 75% mod cues of initial phoneme modeled Goal status: MET for 100% acc with mi/mod cues provided; change to 100% with min cues; Partially met, d/c  4.  Pt will complete basic  level reading comprehension tasks (picture cues for sentence level) with 80% acc with provided mi/mod cues. Baseline: 100% word to picture matching f=5, 0/5 sentences Goal status: MET for single words, continue for sentences; Partially Met, use on device  5.  Pt will verbally describe action photos with use of who what doing template with 80% acc and min/mod cues.  Baseline: 70% mod cues. Goal status: MET for 100% acc with mod/max cues; change to 100% with mod cues; Met  6.  Pt will complete personally relevant responsive naming tasks with 90% acc with use of written cues as needed. Baseline: 75% acc Goal status: MET  7. Pt will use a communication device to cue themselves to speak (reading or imitating words) during 1:1 conversations with SLP with min/mod cues over 3 consecutive sessions.             Baseline: Max assist             Goal status: MET; change to min cues   8. Pt  will respond to personal preference questions by answering with alternative communication device with 90% acc and min assist.             Baseline: Max assist             Goal status: Met   9. Pt will use alternative means of communication (low tech and high tech) to maximize independence with communication and find a system that is most efficient for her.             Baseline: Trial of Lingraphica, Spoken App, letter board, etc             Goal status: MET/ONGOING   10. Pt will use alternative means of communication to locate list of medications, health symptoms, and personal/bio information with indirect cues.             Baseline: mod assist             Goal status: Partially Met/Continue with updated medications  11. Pt will reduce social isolation by using the device to share opinions, tell stories, and  maintain/develop social connections with indirect cues  Baseline: min/mod assist             Goal status: ONGOING  LONG TERM GOALS: Target date: 09/15/2023  Pt will communicate moderately complex wants/needs to [facility] with min assist from communication partner and use of multimodality communication strategies. Baseline: Pt requires mod assist from communication partner  Goal status: ONGOING   ASSESSMENT:  PREVIOUS TREATMENT: Pt accompanied to therapy by Mae today. Session focused on continued independence with device personalization and navigation. She was able to show me what she worked on at home (the News section and some therapy). We added folders for some family members and added additional information about them. She was able to navigate the device to locate responses to SLP questions with indirect cues. Pt used the white board function to write responses to ages in her family. She is not yet asking questions with her device and we will address next session. Continue with use at home and have sister attend therapy session prior to d/c.  CURRENT TREATMENT: Pt accompanied to  therapy by Mae, her caregiver. SLP previously sent an email to Zayla's sister, but did not hear back. Missey called her sister in session to arrange having her come to a session prior to discharging Arlie from therapy so that family knows how to navigate the device. She will  come for her final session on February 5 (may need to arrive a little late due to MD appointment earlier in the day). Raeley was able to navigate to her upcoming appointments section on the Lingraphica independently. SLP facilitated ongoing device personalization by having Inocente select and add: grocery stores, stores, and restaurants with indirect cues. Pasqualina was asked to navigate to specific pages which included: Facebook, Plants, names of children, medical team, health concerns, health insurance, PCP, future appointments, restaurants, and grocery stores. They were given a copy of this to practice with navigation at home. Next session, will add grocery list and continue with Pt navigation.                                                                                                       DATE: 08/14/23  CLINICAL IMPRESSION: (from initial evaluation on 03/19/2023) Patient is a 58 y.o. female who was seen today for speech/language evaluation s/p CVA in May of 2021 with only limited follow up SLP therapy via home health in 2021 when she was staying with her daughter. [name] sister accompanied her to the evaluation and would like for her sister to be able to communicate her wants and needs verbally. 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 Pt has difficulty verbally labeling high frequency objects (but was able to  write some responses), repeating longer words/phrases, and reading phrases. She was able to match 5/5 high frequency objects to single words, but was only able to orally read/label 1/5 words. Pt benefited from gesturing to communicate her intent and this occasionally helped elicit word production and she also benefited from SLP phonemic cues and single word sentence completion cues. Recommend SLP therapy to address communication deficits and increase independence with communication 2x/week for 4-8 weeks.  OBJECTIVE IMPAIRMENTS: include executive functioning, expressive language, receptive language, aphasia, and apraxia. These impairments are limiting patient from managing medications, managing appointments, managing finances, and effectively communicating at home and in community. Factors affecting potential to achieve goals and functional outcome are ability to learn/carryover information, severity of impairments, and time post onset of aphasia (2021) . Patient will benefit from skilled SLP services to address above impairments and improve overall function.  REHAB POTENTIAL: Good  PLAN:  SLP FREQUENCY: 2x/week  SLP DURATION: 8 weeks  PLANNED INTERVENTIONS: Cueing hierachy, Internal/external aids, Functional tasks, Multimodal communication approach, SLP instruction and feedback, Compensatory strategies, Patient/family education, and Re-evaluation    Thank you,  Lamar Candy, CCC-SLP [phone]  Dennys Traughber, CCC-SLP 08/14/2023, 9:52 AM

## 2023-08-14 NOTE — Addendum Note (Signed)
 Addended by: Dorene Ar on: 08/14/2023 12:43 PM   Modules accepted: Orders

## 2023-08-17 ENCOUNTER — Emergency Department (HOSPITAL_COMMUNITY)
Admission: EM | Admit: 2023-08-17 | Discharge: 2023-08-18 | Disposition: A | Discharge status: Home / Self Care | Payer: 59 | Attending: Emergency Medicine | Admitting: Emergency Medicine

## 2023-08-17 ENCOUNTER — Other Ambulatory Visit: Payer: Self-pay

## 2023-08-17 ENCOUNTER — Emergency Department (HOSPITAL_COMMUNITY): Payer: 59

## 2023-08-17 DIAGNOSIS — R1011 Right upper quadrant pain: Secondary | ICD-10-CM | POA: Diagnosis present

## 2023-08-17 DIAGNOSIS — Z7984 Long term (current) use of oral hypoglycemic drugs: Secondary | ICD-10-CM | POA: Insufficient documentation

## 2023-08-17 DIAGNOSIS — Z79899 Other long term (current) drug therapy: Secondary | ICD-10-CM | POA: Diagnosis not present

## 2023-08-17 DIAGNOSIS — E119 Type 2 diabetes mellitus without complications: Secondary | ICD-10-CM | POA: Insufficient documentation

## 2023-08-17 DIAGNOSIS — Z7901 Long term (current) use of anticoagulants: Secondary | ICD-10-CM | POA: Diagnosis not present

## 2023-08-17 DIAGNOSIS — I10 Essential (primary) hypertension: Secondary | ICD-10-CM | POA: Diagnosis not present

## 2023-08-17 DIAGNOSIS — Z7982 Long term (current) use of aspirin: Secondary | ICD-10-CM | POA: Insufficient documentation

## 2023-08-17 DIAGNOSIS — R110 Nausea: Secondary | ICD-10-CM | POA: Insufficient documentation

## 2023-08-17 DIAGNOSIS — Z794 Long term (current) use of insulin: Secondary | ICD-10-CM | POA: Insufficient documentation

## 2023-08-17 DIAGNOSIS — R112 Nausea with vomiting, unspecified: Secondary | ICD-10-CM

## 2023-08-17 DIAGNOSIS — R1010 Upper abdominal pain, unspecified: Secondary | ICD-10-CM

## 2023-08-17 LAB — COMPREHENSIVE METABOLIC PANEL
ALT: 14 U/L (ref 0–44)
AST: 17 U/L (ref 15–41)
Albumin: 3.7 g/dL (ref 3.5–5.0)
Alkaline Phosphatase: 80 U/L (ref 38–126)
Anion gap: 8 (ref 5–15)
BUN: 24 mg/dL — ABNORMAL HIGH (ref 6–20)
CO2: 22 mmol/L (ref 22–32)
Calcium: 8.8 mg/dL — ABNORMAL LOW (ref 8.9–10.3)
Chloride: 105 mmol/L (ref 98–111)
Creatinine, Ser: 2.12 mg/dL — ABNORMAL HIGH (ref 0.44–1.00)
GFR, Estimated: 27 mL/min — ABNORMAL LOW (ref >60)
Glucose, Bld: 174 mg/dL — ABNORMAL HIGH (ref 70–99)
Potassium: 4.2 mmol/L (ref 3.5–5.1)
Sodium: 135 mmol/L (ref 135–145)
Total Bilirubin: 0.2 mg/dL (ref 0.0–1.2)
Total Protein: 7.1 g/dL (ref 6.5–8.1)

## 2023-08-17 LAB — CBC WITH DIFFERENTIAL/PLATELET
Abs Immature Granulocytes: 0.02 10*3/uL (ref 0.00–0.07)
Basophils Absolute: 0.1 10*3/uL (ref 0.0–0.1)
Basophils Relative: 1 %
Eosinophils Absolute: 0.2 10*3/uL (ref 0.0–0.5)
Eosinophils Relative: 2 %
HCT: 44.3 % (ref 36.0–46.0)
Hemoglobin: 13.8 g/dL (ref 12.0–15.0)
Immature Granulocytes: 0 %
Lymphocytes Relative: 29 %
Lymphs Abs: 2 10*3/uL (ref 0.7–4.0)
MCH: 26.6 pg (ref 26.0–34.0)
MCHC: 31.2 g/dL (ref 30.0–36.0)
MCV: 85.5 fL (ref 80.0–100.0)
Monocytes Absolute: 0.7 10*3/uL (ref 0.1–1.0)
Monocytes Relative: 9 %
Neutro Abs: 4.1 10*3/uL (ref 1.7–7.7)
Neutrophils Relative %: 59 %
Platelets: 269 10*3/uL (ref 150–400)
RBC: 5.18 MIL/uL — ABNORMAL HIGH (ref 3.87–5.11)
RDW: 15 % (ref 11.5–15.5)
WBC: 7 10*3/uL (ref 4.0–10.5)
nRBC: 0 % (ref 0.0–0.2)

## 2023-08-17 LAB — LIPASE, BLOOD: Lipase: 40 U/L (ref 11–51)

## 2023-08-17 MED ORDER — ONDANSETRON 4 MG PO TBDP
4.0000 mg | ORAL_TABLET | Freq: Once | ORAL | Status: AC
  Administered 2023-08-17: 4 mg via ORAL
  Filled 2023-08-17: qty 1

## 2023-08-17 MED ORDER — HYDROMORPHONE HCL 1 MG/ML IJ SOLN
1.0000 mg | Freq: Once | INTRAMUSCULAR | Status: AC
  Administered 2023-08-17: 1 mg via INTRAVENOUS
  Filled 2023-08-17: qty 1

## 2023-08-17 NOTE — ED Triage Notes (Signed)
 Pt c/o right sided abdominal pain and nausea.   Pt had previous stroke, weakness on right side and aphasia at baseline

## 2023-08-17 NOTE — ED Provider Notes (Signed)
 [place] EMERGENCY DEPARTMENT AT [facility] Provider Note   CSN: [id number] Arrival date & time: 08/17/23  2215     History  No chief complaint on file.   Robin [name] is a 58 y.o. female.  HPI   This patient is a 58 year old female, she has had a prior stroke affecting the right side of her body and her ability to talk, she is a diabetic, she has hypertension, she denies any prior abdominal surgery.  She reports that she started having some abdominal discomfort today, she is reliant upon her significant other to assist with the history.  She points to the right upper quadrant, states she was able to eat a pork chop and this did not make the pain any worse.  She denies being jaundice, denies vomiting but does endorse nausea.  No constipation diarrhea or blood in the stools.  No urinary symptoms.  Home Medications Prior to Admission medications   Medication Sig Start Date End Date Taking? Authorizing Provider  Accu-Chek Softclix Lancets lancets Use as instructed 06/13/21   Shamleffer, Donell Cardinal, MD  aspirin  EC 81 MG tablet Take 1 tablet (81 mg total) by mouth daily. Swallow whole. 07/19/20   Strader, Laymon HERO, PA-C  Blood Glucose Monitoring Suppl (ACCU-CHEK GUIDE ME) w/Device KIT 1 Device by Does not apply route 3 (three) times daily. 03/09/21   Shamleffer, Ibtehal Jaralla, MD  carvedilol  (COREG ) 25 MG tablet TAKE 2 TABLETS BY MOUTH TWICE DAILY WITH A MEAL 03/19/23   Alvan Dorn FALCON, MD  cetaphil (CETAPHIL) lotion Apply 1 Application topically as needed for dry skin. 10/11/22   Theadore Ozell HERO, MD  dicyclomine  (BENTYL ) 20 MG tablet Take 1 tablet (20 mg total) by mouth 2 (two) times daily as needed for spasms. 02/05/23   Job Lukes, PA  Dulaglutide  (TRULICITY ) 3 MG/0.5ML SOPN Inject 3 mg as directed once a week. 05/09/23   Shamleffer, Donell Cardinal, MD  ELIQUIS  5 MG TABS tablet Take 1 tablet by mouth twice daily 05/29/23   Worley, Samantha, PA  empagliflozin   (JARDIANCE ) 25 MG TABS tablet Take 1 tablet (25 mg total) by mouth daily before breakfast. 05/09/23   Shamleffer, Donell Cardinal, MD  ferrous sulfate  (FEROSUL) 325 (65 FE) MG tablet Take 1 tablet by mouth once daily 07/17/23   Worley, Samantha, PA  furosemide  (LASIX ) 20 MG tablet TAKE 1 TABLET BY MOUTH ONCE DAILY (MAY TAKE ADDITIONAL 20 MG IF WEIGHT GAIN IS OVER 2LBS OVERNIGHT OR 5 LBS IN A WEEK) 08/12/22   Alvan Dorn FALCON, MD  gabapentin  (NEURONTIN ) 100 MG capsule TAKE 2 CAPSULES BY MOUTH THREE TIMES DAILY 08/14/23   Job Lukes, PA  glucose blood (ACCU-CHEK GUIDE) test strip USE 1 STRIP TO CHECK GLUCOSE THREE TIMES DAILY 08/28/22   Shamleffer, Ibtehal Jaralla, MD  hydrALAZINE  (APRESOLINE ) 25 MG tablet TAKE 1 TABLET BY MOUTH THREE TIMES DAILY 06/23/23   Job Lukes, PA  insulin  aspart (NOVOLOG  FLEXPEN) 100 UNIT/ML FlexPen Inject 12 Units into the skin 3 (three) times daily with meals. 10/30/22   Shamleffer, Ibtehal Jaralla, MD  insulin  glargine (LANTUS  SOLOSTAR) 100 UNIT/ML Solostar Pen Inject 44 Units into the skin daily. 10/30/22   Shamleffer, Ibtehal Jaralla, MD  Insulin  Pen Needle (BD PEN NEEDLE NANO U/F) 32G X 4 MM MISC Inject 1 Device into the skin in the morning, at noon, in the evening, and at bedtime. 10/30/22   Shamleffer, Ibtehal Jaralla, MD  isosorbide  mononitrate (IMDUR ) 30 MG 24 hr tablet Take 2  tablets by mouth once daily 06/23/23   Job Lukes, PA  pantoprazole  (PROTONIX ) 40 MG tablet Take 1 tablet by mouth once daily 06/23/23   Job Lukes, PA  timolol  (TIMOPTIC ) 0.5 % ophthalmic solution Place 1 drop into both eyes 2 (two) times daily. 07/05/22   [provider]  tobramycin  (TOBREX ) 0.3 % ophthalmic solution 1 drop 4 (four) times daily. 04/16/23   [provider]      Allergies    Bee venom, Losartan , Naproxen , Penicillins, and Lisinopril     Review of Systems   Review of Systems  All other systems reviewed and are negative.   Physical  Exam Updated Vital Signs LMP 08/21/2011  Physical Exam Vitals and nursing note reviewed.  Constitutional:      General: She is not in acute distress.    Appearance: She is well-developed.  HENT:     Head: Normocephalic and atraumatic.     Mouth/Throat:     Pharynx: No oropharyngeal exudate.  Eyes:     General: No scleral icterus.       Right eye: No discharge.        Left eye: No discharge.     Conjunctiva/sclera: Conjunctivae normal.     Pupils: Pupils are equal, round, and reactive to light.  Neck:     Thyroid: No thyromegaly.     Vascular: No JVD.  Cardiovascular:     Rate and Rhythm: Normal rate and regular rhythm.     Heart sounds: Normal heart sounds. No murmur heard.    No friction rub. No gallop.  Pulmonary:     Effort: Pulmonary effort is normal. No respiratory distress.     Breath sounds: Normal breath sounds. No wheezing or rales.  Abdominal:     General: Bowel sounds are normal. There is no distension.     Palpations: Abdomen is soft. There is no mass.     Tenderness: There is abdominal tenderness.     Comments: Right upper quadrant tenderness with mild guarding, no other abdominal tenderness  Musculoskeletal:        General: No tenderness. Normal range of motion.     Cervical back: Normal range of motion and neck supple.     Right lower leg: No edema.     Left lower leg: No edema.  Lymphadenopathy:     Cervical: No cervical adenopathy.  Skin:    General: Skin is warm and dry.     Findings: No erythema or rash.  Neurological:     Mental Status: She is alert.     Coordination: Coordination normal.     Comments: Patient has difficulty with expressive aphasia, she has right upper extremity weakness secondary to her prior stroke 3 years ago  Psychiatric:        Behavior: Behavior normal.     ED Results / Procedures / Treatments   Labs (all labs ordered are listed, but only abnormal results are displayed) Labs Reviewed  COMPREHENSIVE METABOLIC PANEL   LIPASE, BLOOD  CBC WITH DIFFERENTIAL/PLATELET  URINALYSIS, ROUTINE W REFLEX MICROSCOPIC    EKG None  Radiology No results found.  Procedures Procedures    Medications Ordered in ED Medications  ondansetron  (ZOFRAN -ODT) disintegrating tablet 4 mg (has no administration in time range)    ED Course/ Medical Decision Making/ A&P  Medical Decision Making Amount and/or Complexity of Data Reviewed Labs: ordered. Radiology: ordered. ECG/medicine tests: ordered.  Risk Prescription drug management.    This patient presents to the ED for concern of right upper quadrant abdominal pain, this involves an extensive number of treatment options, and is a complaint that carries with it a high risk of complications and morbidity.  The differential diagnosis includes cholecystitis, pancreatitis, colitis, bowel obstruction, pyelonephritis, aneurysm   Co morbidities that complicate the patient evaluation  Stroke, diabetes, hypertension   Additional history obtained:  Additional history obtained from medical record External records from outside source obtained and reviewed including prior office visits and admission to the hospital, prior CT scans, MRI of the abdomen and pelvis performed in June 2024 because of left lower quadrant and left back pain.  The MRI was done because of the lack of a CT scan and it did show that there was possibly some increased signal arising from the pancreas but no other acute findings.  This patient was seen on arrival, I ordered labs, when the patient was brought back to a room that was time for change of shift, the care was signed out to the oncoming emergency department physician to follow-up results and disposition accordingly.        Final Clinical Impression(s) / ED Diagnoses Final diagnoses:  None    Rx / DC Orders ED Discharge Orders     None         Cleotilde Rogue, MD 08/19/23 [phone]

## 2023-08-18 DIAGNOSIS — R1011 Right upper quadrant pain: Secondary | ICD-10-CM | POA: Diagnosis not present

## 2023-08-18 LAB — URINALYSIS, ROUTINE W REFLEX MICROSCOPIC
Bacteria, UA: NONE SEEN
Bilirubin Urine: NEGATIVE
Glucose, UA: >=500 mg/dL — AB
Hgb urine dipstick: NEGATIVE
Ketones, ur: NEGATIVE mg/dL
Leukocytes,Ua: NEGATIVE
Nitrite: NEGATIVE
Protein, ur: 30 mg/dL — AB
Specific Gravity, Urine: 1.027 (ref 1.005–1.030)
pH: 6 (ref 5.0–8.0)

## 2023-08-18 MED ORDER — DICYCLOMINE HCL 20 MG PO TABS: 20.0000 mg | ORAL_TABLET | Freq: Three times a day (TID) | ORAL | 0 refills | Status: DC

## 2023-08-18 MED ORDER — FAMOTIDINE 20 MG PO TABS: 20.0000 mg | ORAL_TABLET | Freq: Two times a day (BID) | ORAL | 0 refills | Status: DC

## 2023-08-18 MED ORDER — ONDANSETRON 4 MG PO TBDP: ORAL_TABLET | ORAL | 0 refills | Status: DC

## 2023-08-18 MED ORDER — METOCLOPRAMIDE HCL 5 MG/ML IJ SOLN
10.0000 mg | Freq: Once | INTRAMUSCULAR | Status: AC
  Administered 2023-08-18: 10 mg via INTRAVENOUS
  Filled 2023-08-18: qty 2

## 2023-08-18 NOTE — ED Notes (Signed)
 Pt given water

## 2023-08-18 NOTE — ED Provider Notes (Signed)
 Patient signed out to me to follow workup for abdominal pain.  Patient experiencing pain on the right side.  Lab work was unremarkable, no leukocytosis.  LFTs normal, lipase normal.  Patient underwent CAT scan which showed no acute abnormality.  Patient has had prior cholecystectomy, gallbladder not an issue.  Patient had continued nausea and vomiting, this has resolved with IV Reglan , she is currently comfortable without any pain or complaints.  Urinalysis finally obtained and no signs of infection.  Will discharge with symptomatic treatment.   Haze Lonni PARAS, MD 08/18/23 [phone]

## 2023-08-19 ENCOUNTER — Encounter (HOSPITAL_COMMUNITY): Payer: 59 | Admitting: Speech Pathology

## 2023-08-19 ENCOUNTER — Telehealth: Payer: Self-pay

## 2023-08-19 NOTE — Transitions of Care (Post Inpatient/ED Visit) (Signed)
   08/19/2023  Name: Robin [name] MRN: [id number] DOB: 1966/01/17  Today's TOC FU Call Status: Today's TOC FU Call Status:: Unsuccessful Call (1st Attempt) Unsuccessful Call (1st Attempt) Date: 08/19/23  Attempted to reach the patient regarding the most recent Inpatient/ED visit.  Follow Up Plan: Additional outreach attempts will be made to reach the patient to complete the Transitions of Care (Post Inpatient/ED visit) call.   Signature Julian Lemmings, LPN [facility] Nurse Health Advisor Direct Dial  [phone]

## 2023-08-20 ENCOUNTER — Encounter (HOSPITAL_COMMUNITY): Payer: Self-pay | Admitting: Speech Pathology

## 2023-08-20 ENCOUNTER — Ambulatory Visit (HOSPITAL_COMMUNITY): Payer: 59 | Admitting: Speech Pathology

## 2023-08-20 DIAGNOSIS — R4701 Aphasia: Secondary | ICD-10-CM | POA: Diagnosis not present

## 2023-08-20 DIAGNOSIS — I69890 Apraxia following other cerebrovascular disease: Secondary | ICD-10-CM

## 2023-08-20 DIAGNOSIS — R41841 Cognitive communication deficit: Secondary | ICD-10-CM

## 2023-08-20 NOTE — Therapy (Signed)
 OUTPATIENT SPEECH LANGUAGE PATHOLOGY TREATMENT   Patient Name: Robin [name] MRN: [id number] DOB:1966/01/07, 58 y.o., female Today's Date: 08/20/2023  PCP: Alexander Iba, PA REFERRING PROVIDER: Alexander Iba, PA  END OF SESSION:  End of Session - 08/20/23 1214     Visit Number 28    Number of Visits 34    Date for SLP Re-Evaluation 09/15/23    Authorization Type [employer] Medicare and Medicaid secondary; Dual complete   eff 08/05/22 ded 240 met oop 8850 met 2034.20 limit-no auth-no co ins-20%   SLP Start Time 1110    SLP Stop Time  1200    SLP Time Calculation (min) 50 min    Activity Tolerance Patient tolerated treatment well             Past Medical History:  Diagnosis Date   Allergy    Anemia    Aortic stenosis    a. 02/2023 Echo: mild-mod AS.   Arthritis    Chronic abdominal pain    Chronic HFrEF (heart failure with reduced ejection fraction) (HCC)    a. EF at 30-35% by echo in 08/2018 b. EF at 35% by repeat echo in 05/2019 c. EF improved to 65-70% in 2021 d. EF at 40-45% in 08/2021; e. 02/2023 Echo: 40-45%, glob HK, mod LVH, nl RV size/fx, sev BAE, mild-mod AS.   Cocaine abuse (HCC)    COPD (chronic obstructive pulmonary disease) (HCC)    Diabetes mellitus without complication (HCC)    Essential hypertension, benign    Expressive aphasia    post CVA   Fatty liver    GERD (gastroesophageal reflux disease)    Gout 2016   Normal coronary arteries    a. 10/2008 abnl MV; b. 10/2008 Cath: nl cors.   Ovarian cyst    Permanent atrial fibrillation (HCC)    Stroke (HCC) 12/26/2019   Right sided weakness, and expressive aphasia   Thoracic ascending aortic aneurysm (HCC)    a. 4 cm 10/31/19 CTA; b. 02/2023 Echo: Nl Ao root.   Type 2 diabetes mellitus (HCC)    type II   Past Surgical History:  Procedure Laterality Date   ABDOMINAL HYSTERECTOMY  09/10/2011   Procedure: HYSTERECTOMY ABDOMINAL;  Surgeon: Albino Hum, MD;  Location: [facility];  Service: Gynecology;   Laterality: N/A;  Abdominal hysterectomy   CESAREAN SECTION  [phone], and 1994   CHOLECYSTECTOMY  1995   IR 3D INDEPENDENT WKST  03/16/2020   IR ANGIO INTRA EXTRACRAN SEL INTERNAL CAROTID BILAT MOD SED  03/16/2020   IR ANGIO VERTEBRAL SEL SUBCLAVIAN INNOMINATE UNI L MOD SED  03/16/2020   IR ANGIO VERTEBRAL SEL VERTEBRAL UNI R MOD SED  03/16/2020   IR CT HEAD LTD  12/27/2019   IR PERCUTANEOUS ART THROMBECTOMY/INFUSION INTRACRANIAL INC DIAG ANGIO  12/27/2019   IR US  GUIDE VASC ACCESS RIGHT  12/27/2019   IR US  GUIDE VASC ACCESS RIGHT  03/16/2020   RADIOLOGY WITH ANESTHESIA N/A 12/27/2019   Procedure: IR WITH ANESTHESIA;  Surgeon: Luellen Sages, MD;  Location: [facility];  Service: Radiology;  Laterality: N/A;   SCAR REVISION  09/10/2011   Procedure: SCAR REVISION;  Surgeon: Albino Hum, MD;  Location: [facility];  Service: Gynecology;  Laterality: N/A;  Wide Excision of old Cicatrix   TUBAL LIGATION  1994   Patient Active Problem List   Diagnosis Date Noted   Acute on chronic systolic CHF (congestive heart failure) (HCC) 11/27/2021   Stage 3b chronic kidney disease (CKD) (HCC)  11/27/2021   Microcytic anemia 11/27/2021   Lactic acidosis 11/27/2021   Diabetes mellitus (HCC) 03/09/2021   Type 2 diabetes mellitus with stage 3a chronic kidney disease, with long-term current use of insulin  (HCC) 03/09/2021   Acute metabolic encephalopathy 12/31/2020   Hyperglycemia 12/31/2020   Prolonged QT interval 05/12/2020   History of CVA with residual deficit 05/12/2020   Atrial fibrillation, chronic (HCC) 05/12/2020   Right hemiparesis (HCC)    Essential hypertension    Chronic diastolic congestive heart failure (HCC)    Dysphagia due to recent stroke 01/05/2020   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 01/05/2020   Embolic cerebral infarction [facility]) s/p clot retrieval 12/27/2019   Thoracic aortic aneurysm without rupture (HCC)    Atrial fibrillation with RVR (HCC) 05/10/2019   Type 2 diabetes  mellitus with diabetic autonomic neuropathy, with long-term current use of insulin  (HCC) 09/01/2018   Cocaine abuse (HCC) 08/25/2018   Cigarette nicotine dependence, uncomplicated 06/07/2015   GERD (gastroesophageal reflux disease) 06/07/2011   COPD (chronic obstructive pulmonary disease) (HCC) 06/07/2011   Arthritis 06/07/2011    ONSET DATE: May 2021   REFERRING DIAG: R47.01 (ICD-10-CM) - Expressive aphasia  THERAPY DIAG:  Aphasia  Apraxia following other cerebrovascular disease  Cognitive communication deficit  Rationale for Evaluation and Treatment: Rehabilitation  SUBJECTIVE:   SUBJECTIVE STATEMENT: "This is Baby."  Pt accompanied by: self and caregiver  PERTINENT HISTORY: Robin [name] is a pleasant 58 y.o. female with medical history significant for atrial fibrillation on Eliquis , chronic systolic CHF, CKD 3B, insulin -dependent diabetes mellitus, polysubstance abuse, COPD, GERD, HTN, and history of CVA with residual right-sided weakness and aphasia (L MCA CVA and L ICA occlusion s/p thrombectomy on 12/27/2019 followed by CIR stay where she received ST/OT/PT until d/c home on 02/02/20 and then received some [facility] SLP services). She is referred for outpatient SLP therapy by Alexander Iba, PA to facilitate increased independence with communication.  PAIN:  Are you having pain? No  PATIENT GOALS: Increase ability to talk  OBJECTIVE:   DIAGNOSTIC FINDINGS:  MRI 11/13/2020: IMPRESSION: No evidence of acute intracranial abnormality, including acute infarction.   Redemonstrated large chronic left MCA territory infarct.   Redemonstrated small chronic cortically based infarct within the posterior right temporal lobe.   Mild bilateral ethmoid sinus mucosal thickening.  PATIENT EDUCATION: Education details: Herbalist the [employer] educated: Patient and sister, [name] Education method: Explanation and Handouts Education comprehension: verbalized  understanding and needs further education   GOALS: Goals reviewed with patient? Yes  SHORT TERM GOALS: Target date: 09/15/2023  Pt will increase verbal naming of common objects/pictures to 75% acc when provided with mod multimodality cues Baseline: 80% when allowed to write response or provided mod/max cues Goal status: MET for 100% acc for mod/max cues (phonemic cue provided); change to 100% acc with mod cues; met   2.  Pt will describe objects and pictures by providing at least three salient features (single words ok) as judged by clinician with 80% acc when provided mod cues. Baseline: 70% mod/max cues Goal status: MET for 100% with mod/max cues provided; change to 100% acc with mod cues; met   3.  Pt will complete single word sentence completion tasks [facility]) with 90% acc when provided with min/mod multimodality cues.  Baseline: 75% mod cues of initial phoneme modeled Goal status: MET for 100% acc with mi/mod cues provided; change to 100% with min cues; Partially met, d/c  4.  Pt will complete basic level  reading comprehension tasks (picture cues for sentence level) with 80% acc with provided mi/mod cues. Baseline: 100% word to picture matching f=5, 0/5 sentences Goal status: MET for single words, continue for sentences; Partially Met, use on device  5.  Pt will verbally describe action photos with use of "who" "what doing" template with 80% acc and min/mod cues.  Baseline: 70% mod cues. Goal status: MET for 100% acc with mod/max cues; change to 100% with mod cues; Met  6.  Pt will complete personally relevant responsive naming tasks with 90% acc with use of written cues as needed. Baseline: 75% acc Goal status: MET  7. Pt will use a communication device to cue themselves to speak (reading or imitating words) during 1:1 conversations with SLP with min/mod cues over 3 consecutive sessions.             Baseline: Max assist             Goal status: MET; change to min cues   8. Pt  will respond to personal preference questions by answering with alternative communication device with 90% acc and min assist.             Baseline: Max assist             Goal status: Met   9. Pt will use alternative means of communication (low tech and high tech) to maximize independence with communication and find a system that is most efficient for her.             Baseline: Trial of Lingraphica, Spoken App, letter board, etc             Goal status: MET/ONGOING   10. Pt will use alternative means of communication to locate list of medications, health symptoms, and personal/bio information with indirect cues.             Baseline: mod assist             Goal status: Partially Met/Continue with updated medications  11. Pt will reduce social isolation by using the device to share opinions, tell stories, and  maintain/develop social connections with indirect cues  Baseline: min/mod assist             Goal status: ONGOING  LONG TERM GOALS: Target date: 09/15/2023  Pt will communicate moderately complex wants/needs to [facility] with min assist from communication partner and use of multimodality communication strategies. Baseline: Pt requires mod assist from communication partner  Goal status: ONGOING   ASSESSMENT:  PREVIOUS TREATMENT: Pt accompanied to therapy by Mae, her caregiver. SLP previously sent an email to Brittini's sister, but did not hear back. Arlynn called her sister in session to arrange having her come to a session prior to discharging Twanda from therapy so that family knows how to navigate the device. She will come for her final session on February 5 (may need to arrive a little late due to MD appointment earlier in the day). Mohini was able to navigate to her "upcoming appointments" section on the Lingraphica independently. SLP facilitated ongoing device personalization by having [employer] Linker select and add: grocery stores, stores, and restaurants with indirect cues. Tynia was asked to navigate  to specific pages which included: Facebook, Plants, names of children, medical team, health concerns, health insurance, PCP, future appointments, restaurants, and grocery stores. They were given a copy of this to practice with navigation at home. Next session, will add grocery list and continue with Pt navigation.  CURRENT TREATMENT:  Pt accompanied to therapy by Mae and her boyfriend. He stated that has not used the [place] with [employer] so the session was spent providing education on how he can help use the device with her and also having her navigate to the specific cards she was given last week (Facebook, Plants, names of children, medical team, health concerns, health insurance, PCP, future appointments, restaurants, and grocery stores). Unfortunately, she required increased cueing for all navigation this date. Her caregiver stated that she had asked the PM caregiver to practice with her at home, but it appears that has not happened. SLP created  flow chart for how to find each card and Pt should be able to follow this at home to practice on her own. SLP also navigated to the "My Health Concerns" page and asked Pt to select specific ailments (GERD, right rib cage pain, etc) and Pt required mod cues. She was in the emergency room a couple of days ago for right sided upper rib/chest pain and she said that she did not bring her Lingraphica with her to the ER. SLP strongly encouraged Pt and family to bring the device to all medical appointments to help facilitate communication. Reinforce all activities from today in next session.                                                                                                      DATE: 08/20/23  CLINICAL IMPRESSION: (from initial evaluation on 03/19/2023) Patient is a 58 y.o. female who was seen today for speech/language evaluation s/p CVA in May of 2021 with only limited follow up SLP therapy via home health in 2021 when she was staying with her daughter.  [name] sister accompanied her to the evaluation and would like for her sister to be able to communicate her wants and needs verbally. 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 Pt has difficulty verbally labeling high frequency objects (but was able to write some responses), repeating longer words/phrases, and reading phrases. She was able to match 5/5 high frequency objects to single words, but was only able to orally read/label 1/5 words. Pt benefited from gesturing to communicate her intent and this occasionally helped elicit word production and she also benefited from SLP phonemic cues and single word sentence completion cues. Recommend SLP therapy to address communication deficits and increase independence with communication 2x/week for 4-8 weeks.  OBJECTIVE IMPAIRMENTS: include executive functioning, expressive language, receptive language, aphasia, and apraxia. These impairments are limiting patient from managing medications, managing appointments, managing finances, and effectively communicating at home and in community. Factors affecting potential to achieve goals and functional outcome are ability to learn/carryover information, severity of impairments, and time post onset of aphasia (2021) . Patient will benefit from skilled SLP services to address above impairments and improve overall function.  REHAB  POTENTIAL: Good  PLAN:  SLP FREQUENCY: 2x/week  SLP DURATION: 8 weeks  PLANNED INTERVENTIONS: Cueing hierachy, Internal/external aids, Functional tasks, Multimodal communication approach, SLP instruction and feedback, Compensatory strategies, Patient/family education,  and Re-evaluation    Thank you,  Claudetta Cuba, CCC-SLP [phone]  Melford Tullier, CCC-SLP 08/20/2023, 12:19 PM

## 2023-08-20 NOTE — Transitions of Care (Post Inpatient/ED Visit) (Signed)
   08/20/2023  Name: Robin [name] MRN: [id number] DOB: 06-19-1966  Today's TOC FU Call Status: Today's TOC FU Call Status:: Unsuccessful Call (2nd Attempt) Unsuccessful Call (1st Attempt) Date: 08/19/23 Unsuccessful Call (2nd Attempt) Date: 08/20/23  Attempted to reach the patient regarding the most recent Inpatient/ED visit.  Follow Up Plan: Additional outreach attempts will be made to reach the patient to complete the Transitions of Care (Post Inpatient/ED visit) call.   Signature Karena Addison, LPN [facility] Nurse Health Advisor Direct Dial [phone]

## 2023-08-21 ENCOUNTER — Encounter (HOSPITAL_COMMUNITY): Payer: Self-pay | Admitting: Speech Pathology

## 2023-08-21 ENCOUNTER — Ambulatory Visit (HOSPITAL_COMMUNITY): Payer: 59 | Admitting: Speech Pathology

## 2023-08-21 DIAGNOSIS — R4701 Aphasia: Secondary | ICD-10-CM

## 2023-08-21 DIAGNOSIS — R41841 Cognitive communication deficit: Secondary | ICD-10-CM

## 2023-08-21 DIAGNOSIS — I69890 Apraxia following other cerebrovascular disease: Secondary | ICD-10-CM

## 2023-08-21 NOTE — Therapy (Signed)
OUTPATIENT SPEECH LANGUAGE PATHOLOGY TREATMENT   Patient Name: Robin [name] MRN: [id number] DOB:04-09-1966, 58 y.o., female Today's Date: 08/21/2023  PCP: Jarold Motto, PA REFERRING PROVIDER: Jarold Motto, PA  END OF SESSION:  End of Session - 08/21/23 1038     Visit Number 29    Number of Visits 34    Date for SLP Re-Evaluation 09/15/23    Authorization Type UHC Medicare and Medicaid secondary; Dual complete   eff 08/05/22 ded 240 met oop 8850 met 2034.20 limit-no auth-no co ins-20%   SLP Start Time 1015    SLP Stop Time  1100    SLP Time Calculation (min) 45 min    Activity Tolerance Patient tolerated treatment well             Past Medical History:  Diagnosis Date   Allergy    Anemia    Aortic stenosis    a. 02/2023 Echo: mild-mod AS.   Arthritis    Chronic abdominal pain    Chronic HFrEF (heart failure with reduced ejection fraction) (HCC)    a. EF at 30-35% by echo in 08/2018 b. EF at 35% by repeat echo in 05/2019 c. EF improved to 65-70% in 2021 d. EF at 40-45% in 08/2021; e. 02/2023 Echo: 40-45%, glob HK, mod LVH, nl RV size/fx, sev BAE, mild-mod AS.   Cocaine abuse (HCC)    COPD (chronic obstructive pulmonary disease) (HCC)    Diabetes mellitus without complication (HCC)    Essential hypertension, benign    Expressive aphasia    post CVA   Fatty liver    GERD (gastroesophageal reflux disease)    Gout 2016   Normal coronary arteries    a. 10/2008 abnl MV; b. 10/2008 Cath: nl cors.   Ovarian cyst    Permanent atrial fibrillation (HCC)    Stroke (HCC) 12/26/2019   Right sided weakness, and expressive aphasia   Thoracic ascending aortic aneurysm (HCC)    a. 4 cm 10/31/19 CTA; b. 02/2023 Echo: Nl Ao root.   Type 2 diabetes mellitus (HCC)    type II   Past Surgical History:  Procedure Laterality Date   ABDOMINAL HYSTERECTOMY  09/10/2011   Procedure: HYSTERECTOMY ABDOMINAL;  Surgeon: Tilda Burrow, MD;  Location: [facility];  Service: Gynecology;   Laterality: N/A;  Abdominal hysterectomy   CESAREAN SECTION  [phone], and 1994   CHOLECYSTECTOMY  1995   IR 3D INDEPENDENT WKST  03/16/2020   IR ANGIO INTRA EXTRACRAN SEL INTERNAL CAROTID BILAT MOD SED  03/16/2020   IR ANGIO VERTEBRAL SEL SUBCLAVIAN INNOMINATE UNI L MOD SED  03/16/2020   IR ANGIO VERTEBRAL SEL VERTEBRAL UNI R MOD SED  03/16/2020   IR CT HEAD LTD  12/27/2019   IR PERCUTANEOUS ART THROMBECTOMY/INFUSION INTRACRANIAL INC DIAG ANGIO  12/27/2019   IR US GUIDE VASC ACCESS RIGHT  12/27/2019   IR US GUIDE VASC ACCESS RIGHT  03/16/2020   RADIOLOGY WITH ANESTHESIA N/A 12/27/2019   Procedure: IR WITH ANESTHESIA;  Surgeon: Julieanne Cotton, MD;  Location: [facility];  Service: Radiology;  Laterality: N/A;   SCAR REVISION  09/10/2011   Procedure: SCAR REVISION;  Surgeon: Tilda Burrow, MD;  Location: [facility];  Service: Gynecology;  Laterality: N/A;  Wide Excision of old Cicatrix   TUBAL LIGATION  1994   Patient Active Problem List   Diagnosis Date Noted   Acute on chronic systolic CHF (congestive heart failure) (HCC) 11/27/2021   Stage 3b chronic kidney disease (CKD) (HCC)  11/27/2021   Microcytic anemia 11/27/2021   Lactic acidosis 11/27/2021   Diabetes mellitus (HCC) 03/09/2021   Type 2 diabetes mellitus with stage 3a chronic kidney disease, with long-term current use of insulin (HCC) 03/09/2021   Acute metabolic encephalopathy 12/31/2020   Hyperglycemia 12/31/2020   Prolonged QT interval 05/12/2020   History of CVA with residual deficit 05/12/2020   Atrial fibrillation, chronic (HCC) 05/12/2020   Right hemiparesis (HCC)    Essential hypertension    Chronic diastolic congestive heart failure (HCC)    Dysphagia due to recent stroke 01/05/2020   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 01/05/2020   Embolic cerebral infarction [facility]) s/p clot retrieval 12/27/2019   Thoracic aortic aneurysm without rupture (HCC)    Atrial fibrillation with RVR (HCC) 05/10/2019   Type 2 diabetes  mellitus with diabetic autonomic neuropathy, with long-term current use of insulin (HCC) 09/01/2018   Cocaine abuse (HCC) 08/25/2018   Cigarette nicotine dependence, uncomplicated 06/07/2015   GERD (gastroesophageal reflux disease) 06/07/2011   COPD (chronic obstructive pulmonary disease) (HCC) 06/07/2011   Arthritis 06/07/2011    ONSET DATE: May 2021   REFERRING DIAG: R47.01 (ICD-10-CM) - Expressive aphasia  THERAPY DIAG:  Aphasia  Apraxia following other cerebrovascular disease  Cognitive communication deficit  Rationale for Evaluation and Treatment: Rehabilitation  SUBJECTIVE:   SUBJECTIVE STATEMENT: "It is cold!"  Pt accompanied by: self and caregiver  PERTINENT HISTORY: Robin [name] is a pleasant 58 y.o. female with medical history significant for atrial fibrillation on Eliquis, chronic systolic CHF, CKD 3B, insulin-dependent diabetes mellitus, polysubstance abuse, COPD, GERD, HTN, and history of CVA with residual right-sided weakness and aphasia (L MCA CVA and L ICA occlusion s/p thrombectomy on 12/27/2019 followed by CIR stay where she received ST/OT/PT until d/c home on 02/02/20 and then received some [facility] SLP services). She is referred for outpatient SLP therapy by Jarold Motto, PA to facilitate increased independence with communication.  PAIN:  Are you having pain? No  PATIENT GOALS: Increase ability to talk  OBJECTIVE:   DIAGNOSTIC FINDINGS:  MRI 11/13/2020: IMPRESSION: No evidence of acute intracranial abnormality, including acute infarction.   Redemonstrated large chronic left MCA territory infarct.   Redemonstrated small chronic cortically based infarct within the posterior right temporal lobe.   Mild bilateral ethmoid sinus mucosal thickening.  PATIENT EDUCATION: Education details: Herbalist the [employer] educated: Patient and sister, [name] Education method: Explanation and Handouts Education comprehension: verbalized understanding  and needs further education   GOALS: Goals reviewed with patient? Yes  SHORT TERM GOALS: Target date: 09/15/2023  Pt will increase verbal naming of common objects/pictures to 75% acc when provided with mod multimodality cues Baseline: 80% when allowed to write response or provided mod/max cues Goal status: MET for 100% acc for mod/max cues (phonemic cue provided); change to 100% acc with mod cues; met   2.  Pt will describe objects and pictures by providing at least three salient features (single words ok) as judged by clinician with 80% acc when provided mod cues. Baseline: 70% mod/max cues Goal status: MET for 100% with mod/max cues provided; change to 100% acc with mod cues; met   3.  Pt will complete single word sentence completion tasks [facility]) with 90% acc when provided with min/mod multimodality cues.  Baseline: 75% mod cues of initial phoneme modeled Goal status: MET for 100% acc with mi/mod cues provided; change to 100% with min cues; Partially met, d/c  4.  Pt will complete basic level  reading comprehension tasks (picture cues for sentence level) with 80% acc with provided mi/mod cues. Baseline: 100% word to picture matching f=5, 0/5 sentences Goal status: MET for single words, continue for sentences; Partially Met, use on device  5.  Pt will verbally describe action photos with use of "who" "what doing" template with 80% acc and min/mod cues.  Baseline: 70% mod cues. Goal status: MET for 100% acc with mod/max cues; change to 100% with mod cues; Met  6.  Pt will complete personally relevant responsive naming tasks with 90% acc with use of written cues as needed. Baseline: 75% acc Goal status: MET  7. Pt will use a communication device to cue themselves to speak (reading or imitating words) during 1:1 conversations with SLP with min/mod cues over 3 consecutive sessions.             Baseline: Max assist             Goal status: MET; change to min cues   8. Pt will respond to  personal preference questions by answering with alternative communication device with 90% acc and min assist.             Baseline: Max assist             Goal status: Met   9. Pt will use alternative means of communication (low tech and high tech) to maximize independence with communication and find a system that is most efficient for her.             Baseline: Trial of Lingraphica, Spoken App, letter board, etc             Goal status: MET/ONGOING   10. Pt will use alternative means of communication to locate list of medications, health symptoms, and personal/bio information with indirect cues.             Baseline: mod assist             Goal status: Partially Met/Continue with updated medications  11. Pt will reduce social isolation by using the device to share opinions, tell stories, and  maintain/develop social connections with indirect cues  Baseline: min/mod assist             Goal status: ONGOING  LONG TERM GOALS: Target date: 09/15/2023  Pt will communicate moderately complex wants/needs to [facility] with min assist from communication partner and use of multimodality communication strategies. Baseline: Pt requires mod assist from communication partner  Goal status: ONGOING   ASSESSMENT:  PREVIOUS TREATMENT: Pt accompanied to therapy by Mae and her boyfriend. He stated that has not used the [place] with [place]is so the session was spent providing education on how he can help use the device with her and also having her navigate to the specific cards she was given last week (Facebook, Plants, names of children, medical team, health concerns, health insurance, PCP, future appointments, restaurants, and grocery stores). Unfortunately, she required increased cueing for all navigation this date. Her caregiver stated that she had asked the PM caregiver to practice with her at home, but it appears that has not happened. SLP created  flow chart for how to find each card and Pt should be able to  follow this at home to practice on her own. SLP also navigated to the "My Health Concerns" page and asked Pt to select specific ailments (GERD, right rib cage pain, etc) and Pt required mod cues. She was in the emergency room a couple of  days ago for right sided upper rib/chest pain and she said that she did not bring her Lingraphica with her to the ER. SLP strongly encouraged Pt and family to bring the device to all medical appointments to help facilitate communication. Reinforce all activities from today in next session.  CURRENT TREATMENT:  Pt accompanied to therapy by Mae. We worked on Water quality scientist and SLP assisted with navigation to specific categories (fruits, vegetables, meats, etc). Pt then independently selected items from each category to add to the grocery list folder (she came up with ~50+ items). She was then able to navigate to find the folder with indirect cues. She initiated that she likes to make "stew" when it is cold, so we created a folder for this including all of the ingredients. She initially communicated (verbalized and wrote) 2 ingredients and then used the Lingraphica to locate and communicate an additional 6 more ingredients. She needs assistance to create additional pages using the "copy" function. She was then asked to navigate to specific categories and she needed mi/mod cues. She will need continued practice at home to use her device more efficiently. Continue to target goals.                                                                                                      DATE: 08/21/23  CLINICAL IMPRESSION: (from initial evaluation on 03/19/2023) Patient is a 58 y.o. female who was seen today for speech/language evaluation s/p CVA in May of 2021 with only limited follow up SLP therapy via home health in 2021 when she was staying with her daughter. [name] sister accompanied her to the evaluation and would like for her sister to be able to communicate her wants  and needs verbally. 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 Pt has difficulty verbally labeling high frequency objects (but was able to write some responses), repeating longer words/phrases, and reading phrases. She was able to match 5/5 high frequency objects to single words, but was only able to orally read/label 1/5 words. Pt benefited from gesturing to communicate her intent and this occasionally helped elicit word production and she also benefited from SLP phonemic cues and single word sentence completion cues. Recommend SLP therapy to address communication deficits and increase independence with communication 2x/week for 4-8 weeks.  OBJECTIVE IMPAIRMENTS: include executive functioning, expressive language, receptive language, aphasia, and apraxia. These impairments are limiting patient from managing medications, managing appointments, managing finances, and effectively communicating at home and in community. Factors affecting potential to achieve goals and functional outcome are ability to learn/carryover information, severity of impairments, and time post onset of aphasia (2021) . Patient will benefit from skilled SLP services to address above impairments and improve overall function.  REHAB POTENTIAL: Good  PLAN:  SLP FREQUENCY: 2x/week  SLP DURATION:  8 weeks  PLANNED INTERVENTIONS: Cueing hierachy, Internal/external aids, Functional tasks, Multimodal communication approach, SLP instruction and feedback, Compensatory strategies, Patient/family education, and Re-evaluation    Thank you,  Havery Moros, CCC-SLP [phone]  Anushree Dorsi,  CCC-SLP 08/21/2023, 10:39 AM

## 2023-08-25 ENCOUNTER — Encounter (HOSPITAL_COMMUNITY): Payer: Self-pay | Admitting: Speech Pathology

## 2023-08-25 ENCOUNTER — Ambulatory Visit (HOSPITAL_COMMUNITY): Payer: 59 | Admitting: Speech Pathology

## 2023-08-25 DIAGNOSIS — R4701 Aphasia: Secondary | ICD-10-CM

## 2023-08-25 DIAGNOSIS — I69890 Apraxia following other cerebrovascular disease: Secondary | ICD-10-CM

## 2023-08-25 DIAGNOSIS — R41841 Cognitive communication deficit: Secondary | ICD-10-CM

## 2023-08-25 NOTE — Therapy (Signed)
OUTPATIENT SPEECH LANGUAGE PATHOLOGY TREATMENT   Patient Name: Robin [name] MRN: [id number] DOB:Jan 06, 1966, 58 y.o., female Today's Date: 08/25/2023  PCP: Jarold Motto, PA REFERRING PROVIDER: Jarold Motto, PA  END OF SESSION:  End of Session - 08/25/23 1252     Visit Number 30    Number of Visits 34    Date for SLP Re-Evaluation 09/15/23    Authorization Type UHC Medicare and Medicaid secondary; Dual complete   eff 08/05/22 ded 240 met oop 8850 met 2034.20 limit-no auth-no co ins-20%   SLP Start Time 1015    SLP Stop Time  1115    SLP Time Calculation (min) 60 min    Activity Tolerance Patient tolerated treatment well             Past Medical History:  Diagnosis Date   Allergy    Anemia    Aortic stenosis    a. 02/2023 Echo: mild-mod AS.   Arthritis    Chronic abdominal pain    Chronic HFrEF (heart failure with reduced ejection fraction) (HCC)    a. EF at 30-35% by echo in 08/2018 b. EF at 35% by repeat echo in 05/2019 c. EF improved to 65-70% in 2021 d. EF at 40-45% in 08/2021; e. 02/2023 Echo: 40-45%, glob HK, mod LVH, nl RV size/fx, sev BAE, mild-mod AS.   Cocaine abuse (HCC)    COPD (chronic obstructive pulmonary disease) (HCC)    Diabetes mellitus without complication (HCC)    Essential hypertension, benign    Expressive aphasia    post CVA   Fatty liver    GERD (gastroesophageal reflux disease)    Gout 2016   Normal coronary arteries    a. 10/2008 abnl MV; b. 10/2008 Cath: nl cors.   Ovarian cyst    Permanent atrial fibrillation (HCC)    Stroke (HCC) 12/26/2019   Right sided weakness, and expressive aphasia   Thoracic ascending aortic aneurysm (HCC)    a. 4 cm 10/31/19 CTA; b. 02/2023 Echo: Nl Ao root.   Type 2 diabetes mellitus (HCC)    type II   Past Surgical History:  Procedure Laterality Date   ABDOMINAL HYSTERECTOMY  09/10/2011   Procedure: HYSTERECTOMY ABDOMINAL;  Surgeon: Tilda Burrow, MD;  Location: [facility];  Service: Gynecology;   Laterality: N/A;  Abdominal hysterectomy   CESAREAN SECTION  [id number], and 1994   CHOLECYSTECTOMY  1995   IR 3D INDEPENDENT WKST  03/16/2020   IR ANGIO INTRA EXTRACRAN SEL INTERNAL CAROTID BILAT MOD SED  03/16/2020   IR ANGIO VERTEBRAL SEL SUBCLAVIAN INNOMINATE UNI L MOD SED  03/16/2020   IR ANGIO VERTEBRAL SEL VERTEBRAL UNI R MOD SED  03/16/2020   IR CT HEAD LTD  12/27/2019   IR PERCUTANEOUS ART THROMBECTOMY/INFUSION INTRACRANIAL INC DIAG ANGIO  12/27/2019   IR US GUIDE VASC ACCESS RIGHT  12/27/2019   IR US GUIDE VASC ACCESS RIGHT  03/16/2020   RADIOLOGY WITH ANESTHESIA N/A 12/27/2019   Procedure: IR WITH ANESTHESIA;  Surgeon: Julieanne Cotton, MD;  Location: [facility];  Service: Radiology;  Laterality: N/A;   SCAR REVISION  09/10/2011   Procedure: SCAR REVISION;  Surgeon: Tilda Burrow, MD;  Location: [facility];  Service: Gynecology;  Laterality: N/A;  Wide Excision of old Cicatrix   TUBAL LIGATION  1994   Patient Active Problem List   Diagnosis Date Noted   Acute on chronic systolic CHF (congestive heart failure) (HCC) 11/27/2021   Stage 3b chronic kidney disease (CKD) (HCC)  11/27/2021   Microcytic anemia 11/27/2021   Lactic acidosis 11/27/2021   Diabetes mellitus (HCC) 03/09/2021   Type 2 diabetes mellitus with stage 3a chronic kidney disease, with long-term current use of insulin (HCC) 03/09/2021   Acute metabolic encephalopathy 12/31/2020   Hyperglycemia 12/31/2020   Prolonged QT interval 05/12/2020   History of CVA with residual deficit 05/12/2020   Atrial fibrillation, chronic (HCC) 05/12/2020   Right hemiparesis (HCC)    Essential hypertension    Chronic diastolic congestive heart failure (HCC)    Dysphagia due to recent stroke 01/05/2020   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 01/05/2020   Embolic cerebral infarction [facility]) s/p clot retrieval 12/27/2019   Thoracic aortic aneurysm without rupture (HCC)    Atrial fibrillation with RVR (HCC) 05/10/2019   Type 2 diabetes  mellitus with diabetic autonomic neuropathy, with long-term current use of insulin (HCC) 09/01/2018   Cocaine abuse (HCC) 08/25/2018   Cigarette nicotine dependence, uncomplicated 06/07/2015   GERD (gastroesophageal reflux disease) 06/07/2011   COPD (chronic obstructive pulmonary disease) (HCC) 06/07/2011   Arthritis 06/07/2011    ONSET DATE: May 2021   REFERRING DIAG: R47.01 (ICD-10-CM) - Expressive aphasia  THERAPY DIAG:  Aphasia  Apraxia following other cerebrovascular disease  Cognitive communication deficit  Rationale for Evaluation and Treatment: Rehabilitation  SUBJECTIVE:   SUBJECTIVE STATEMENT: "Sweetheart!"  Pt accompanied by: self and caregiver  PERTINENT HISTORY: Robin [name] is a pleasant 59 y.o. female with medical history significant for atrial fibrillation on Eliquis, chronic systolic CHF, CKD 3B, insulin-dependent diabetes mellitus, polysubstance abuse, COPD, GERD, HTN, and history of CVA with residual right-sided weakness and aphasia (L MCA CVA and L ICA occlusion s/p thrombectomy on 12/27/2019 followed by CIR stay where she received ST/OT/PT until d/c home on 02/02/20 and then received some [facility] SLP services). She is referred for outpatient SLP therapy by Jarold Motto, PA to facilitate increased independence with communication.  PAIN:  Are you having pain? No  PATIENT GOALS: Increase ability to talk  OBJECTIVE:   DIAGNOSTIC FINDINGS:  MRI 11/13/2020: IMPRESSION: No evidence of acute intracranial abnormality, including acute infarction.   Redemonstrated large chronic left MCA territory infarct.   Redemonstrated small chronic cortically based infarct within the posterior right temporal lobe.   Mild bilateral ethmoid sinus mucosal thickening.  PATIENT EDUCATION: Education details: Herbalist the [employer] educated: Patient and sister, [name] Education method: Explanation and Handouts Education comprehension: verbalized understanding  and needs further education   GOALS: Goals reviewed with patient? Yes  SHORT TERM GOALS: Target date: 09/15/2023  Pt will increase verbal naming of common objects/pictures to 75% acc when provided with mod multimodality cues Baseline: 80% when allowed to write response or provided mod/max cues Goal status: MET for 100% acc for mod/max cues (phonemic cue provided); change to 100% acc with mod cues; met   2.  Pt will describe objects and pictures by providing at least three salient features (single words ok) as judged by clinician with 80% acc when provided mod cues. Baseline: 70% mod/max cues Goal status: MET for 100% with mod/max cues provided; change to 100% acc with mod cues; met   3.  Pt will complete single word sentence completion tasks [facility]) with 90% acc when provided with min/mod multimodality cues.  Baseline: 75% mod cues of initial phoneme modeled Goal status: MET for 100% acc with mi/mod cues provided; change to 100% with min cues; Partially met, d/c  4.  Pt will complete basic level reading comprehension  tasks (picture cues for sentence level) with 80% acc with provided mi/mod cues. Baseline: 100% word to picture matching f=5, 0/5 sentences Goal status: MET for single words, continue for sentences; Partially Met, use on device  5.  Pt will verbally describe action photos with use of "who" "what doing" template with 80% acc and min/mod cues.  Baseline: 70% mod cues. Goal status: MET for 100% acc with mod/max cues; change to 100% with mod cues; Met  6.  Pt will complete personally relevant responsive naming tasks with 90% acc with use of written cues as needed. Baseline: 75% acc Goal status: MET  7. Pt will use a communication device to cue themselves to speak (reading or imitating words) during 1:1 conversations with SLP with min/mod cues over 3 consecutive sessions.             Baseline: Max assist             Goal status: MET; change to min cues   8. Pt will respond to  personal preference questions by answering with alternative communication device with 90% acc and min assist.             Baseline: Max assist             Goal status: Met   9. Pt will use alternative means of communication (low tech and high tech) to maximize independence with communication and find a system that is most efficient for her.             Baseline: Trial of Lingraphica, Spoken App, letter board, etc             Goal status: MET/ONGOING   10. Pt will use alternative means of communication to locate list of medications, health symptoms, and personal/bio information with indirect cues.             Baseline: mod assist             Goal status: Partially Met/Continue with updated medications  11. Pt will reduce social isolation by using the device to share opinions, tell stories, and  maintain/develop social connections with indirect cues  Baseline: min/mod assist             Goal status: ONGOING  LONG TERM GOALS: Target date: 09/15/2023  Pt will communicate moderately complex wants/needs to [facility] with min assist from communication partner and use of multimodality communication strategies. Baseline: Pt requires mod assist from communication partner  Goal status: ONGOING   ASSESSMENT:  PREVIOUS TREATMENT: Pt accompanied to therapy by Mae. We worked on Water quality scientist and SLP assisted with navigation to specific categories (fruits, vegetables, meats, etc). Pt then independently selected items from each category to add to the grocery list folder (she came up with ~50+ items). She was then able to navigate to find the folder with indirect cues. She initiated that she likes to make "stew" when it is cold, so we created a folder for this including all of the ingredients. She initially communicated (verbalized and wrote) 2 ingredients and then used the Lingraphica to locate and communicate an additional 6 more ingredients. She needs assistance to create additional pages using  the "copy" function. She was then asked to navigate to specific categories and she needed mi/mod cues. She will need continued practice at home to use her device more efficiently. Continue to target goals.  CURRENT TREATMENT:  Pt accompanied to therapy by Mae. SLP asked Pt who drove her to  therapy, since [employer] wasn't running on [place] day. She was unable verbalize a response, until she was cued to write a response using the dry erase board on her Lingraphica ("sweetheart"). SLP wanted a few new icons added, which included some local restaurants and food items. She was able to select toppings for her pizza (pepperoni, sausage, onions, and cheese) and then navigate to the folder with written cues. SLP created flash cards for Pt to practice device navigation with work flow listed on the back of each card. She completed this task with 90% acc. Pt cued to use the icons to prompt her to speak. She had difficulty initiating the initial phoneme and needed assist from SLP via visual model and phonetic placement cues. Pt given flash cards to practice for HEP.                                                                                                       DATE: 08/25/23  CLINICAL IMPRESSION: (from initial evaluation on 03/19/2023) Patient is a 58 y.o. female who was seen today for speech/language evaluation s/p CVA in May of 2021 with only limited follow up SLP therapy via home health in 2021 when she was staying with her daughter. [name] sister accompanied her to the evaluation and would like for her sister to be able to communicate her wants and needs verbally. Pt presents with moderate expressive aphasia and suspect apraxia and mi/mod receptive aphasia with relative strengths in her ability to write single word responses during naming tasks, use of gestures, automatic speech tasks, repetition of single, high frequency words, responding accurately to basic yes/no questions, following basic 1 step directions, and object  recognition. Pt has difficulty verbally labeling high frequency objects (but was able to write some responses), repeating longer words/phrases, and reading phrases. She was able to match 5/5 high frequency objects to single words, but was only able to orally read/label 1/5 words. Pt benefited from gesturing to communicate her intent and this occasionally helped elicit word production and she also benefited from SLP phonemic cues and single word sentence completion cues. Recommend SLP therapy to address communication deficits and increase independence with communication 2x/week for 4-8 weeks.  OBJECTIVE IMPAIRMENTS: include executive functioning, expressive language, receptive language, aphasia, and apraxia. These impairments are limiting patient from managing medications, managing appointments, managing finances, and effectively communicating at home and in community. Factors affecting potential to achieve goals and functional outcome are ability to learn/carryover information, severity of impairments, and time post onset of aphasia (2021) . Patient will benefit from skilled SLP services to address above impairments and improve overall function.  REHAB POTENTIAL: Good  PLAN:  SLP FREQUENCY: 2x/week  SLP DURATION: 8 weeks  PLANNED INTERVENTIONS: Cueing hierachy, Internal/external aids, Functional tasks, Multimodal communication approach, SLP instruction and feedback, Compensatory strategies, Patient/family education, and Re-evaluation    Thank you,  Havery Moros, CCC-SLP [phone]  Camie Hauss, CCC-SLP 08/25/2023, 12:53 PM

## 2023-08-26 NOTE — Transitions of Care (Post Inpatient/ED Visit) (Signed)
   08/26/2023  Name: Breighlynn Madan Buren MRN: [id number] DOB: Apr 18, 1966  Today's TOC FU Call Status: Today's TOC FU Call Status:: Unsuccessful Call (3rd Attempt) Unsuccessful Call (1st Attempt) Date: 08/19/23 Unsuccessful Call (2nd Attempt) Date: 08/20/23 Unsuccessful Call (3rd Attempt) Date: 08/26/23  Attempted to reach the patient regarding the most recent Inpatient/ED visit.  Follow Up Plan: No further outreach attempts will be made at this time. We have been unable to contact the patient.  Signature Karena Addison, LPN [facility] Nurse Health Advisor Direct Dial [phone]

## 2023-08-27 ENCOUNTER — Encounter (HOSPITAL_COMMUNITY): Payer: Self-pay | Admitting: Speech Pathology

## 2023-08-27 ENCOUNTER — Ambulatory Visit (HOSPITAL_COMMUNITY): Payer: 59 | Admitting: Speech Pathology

## 2023-08-27 DIAGNOSIS — R41841 Cognitive communication deficit: Secondary | ICD-10-CM

## 2023-08-27 DIAGNOSIS — I69890 Apraxia following other cerebrovascular disease: Secondary | ICD-10-CM

## 2023-08-27 DIAGNOSIS — R4701 Aphasia: Secondary | ICD-10-CM | POA: Diagnosis not present

## 2023-08-27 NOTE — Therapy (Signed)
OUTPATIENT SPEECH LANGUAGE PATHOLOGY TREATMENT   Patient Name: Robin [name] MRN: [id number] DOB:31-Dec-1965, 58 y.o., female Today's Date: 08/27/2023  PCP: Robin Motto, PA REFERRING PROVIDER: Jarold Motto, PA  END OF SESSION:  End of Session - 08/27/23 1013     Visit Number 31    Number of Visits 34    Date for SLP Re-Evaluation 09/15/23    Authorization Type [employer] Medicare and Medicaid secondary; Dual complete   eff 08/05/22 ded 240 met oop 8850 met 2034.20 limit-no auth-no co ins-20%   SLP Start Time 0935    SLP Stop Time  1020    SLP Time Calculation (min) 45 min    Activity Tolerance Patient tolerated treatment well             Past Medical History:  Diagnosis Date   Allergy    Anemia    Aortic stenosis    a. 02/2023 Echo: mild-mod AS.   Arthritis    Chronic abdominal pain    Chronic HFrEF (heart failure with reduced ejection fraction) (HCC)    a. EF at 30-35% by echo in 08/2018 b. EF at 35% by repeat echo in 05/2019 c. EF improved to 65-70% in 2021 d. EF at 40-45% in 08/2021; e. 02/2023 Echo: 40-45%, glob HK, mod LVH, nl RV size/fx, sev BAE, mild-mod AS.   Cocaine abuse (HCC)    COPD (chronic obstructive pulmonary disease) (HCC)    Diabetes mellitus without complication (HCC)    Essential hypertension, benign    Expressive aphasia    post CVA   Fatty liver    GERD (gastroesophageal reflux disease)    Gout 2016   Normal coronary arteries    a. 10/2008 abnl MV; b. 10/2008 Cath: nl cors.   Ovarian cyst    Permanent atrial fibrillation (HCC)    Stroke (HCC) 12/26/2019   Right sided weakness, and expressive aphasia   Thoracic ascending aortic aneurysm (HCC)    a. 4 cm 10/31/19 CTA; b. 02/2023 Echo: Nl Ao root.   Type 2 diabetes mellitus (HCC)    type II   Past Surgical History:  Procedure Laterality Date   ABDOMINAL HYSTERECTOMY  09/10/2011   Procedure: HYSTERECTOMY ABDOMINAL;  Surgeon: Tilda Burrow, MD;  Location: [facility];  Service: Gynecology;   Laterality: N/A;  Abdominal hysterectomy   CESAREAN SECTION  [phone], and 1994   CHOLECYSTECTOMY  1995   IR 3D INDEPENDENT WKST  03/16/2020   IR ANGIO INTRA EXTRACRAN SEL INTERNAL CAROTID BILAT MOD SED  03/16/2020   IR ANGIO VERTEBRAL SEL SUBCLAVIAN INNOMINATE UNI L MOD SED  03/16/2020   IR ANGIO VERTEBRAL SEL VERTEBRAL UNI R MOD SED  03/16/2020   IR CT HEAD LTD  12/27/2019   IR PERCUTANEOUS ART THROMBECTOMY/INFUSION INTRACRANIAL INC DIAG ANGIO  12/27/2019   IR US GUIDE VASC ACCESS RIGHT  12/27/2019   IR US GUIDE VASC ACCESS RIGHT  03/16/2020   RADIOLOGY WITH ANESTHESIA N/A 12/27/2019   Procedure: IR WITH ANESTHESIA;  Surgeon: Julieanne Cotton, MD;  Location: [facility];  Service: Radiology;  Laterality: N/A;   SCAR REVISION  09/10/2011   Procedure: SCAR REVISION;  Surgeon: Tilda Burrow, MD;  Location: [facility];  Service: Gynecology;  Laterality: N/A;  Wide Excision of old Cicatrix   TUBAL LIGATION  1994   Patient Active Problem List   Diagnosis Date Noted   Acute on chronic systolic CHF (congestive heart failure) (HCC) 11/27/2021   Stage 3b chronic kidney disease (CKD) (HCC)  11/27/2021   Microcytic anemia 11/27/2021   Lactic acidosis 11/27/2021   Diabetes mellitus (HCC) 03/09/2021   Type 2 diabetes mellitus with stage 3a chronic kidney disease, with long-term current use of insulin (HCC) 03/09/2021   Acute metabolic encephalopathy 12/31/2020   Hyperglycemia 12/31/2020   Prolonged QT interval 05/12/2020   History of CVA with residual deficit 05/12/2020   Atrial fibrillation, chronic (HCC) 05/12/2020   Right hemiparesis (HCC)    Essential hypertension    Chronic diastolic congestive heart failure (HCC)    Dysphagia due to recent stroke 01/05/2020   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 01/05/2020   Embolic cerebral infarction [facility]) s/p clot retrieval 12/27/2019   Thoracic aortic aneurysm without rupture (HCC)    Atrial fibrillation with RVR (HCC) 05/10/2019   Type 2 diabetes  mellitus with diabetic autonomic neuropathy, with long-term current use of insulin (HCC) 09/01/2018   Cocaine abuse (HCC) 08/25/2018   Cigarette nicotine dependence, uncomplicated 06/07/2015   GERD (gastroesophageal reflux disease) 06/07/2011   COPD (chronic obstructive pulmonary disease) (HCC) 06/07/2011   Arthritis 06/07/2011    ONSET DATE: May 2021   REFERRING DIAG: R47.01 (ICD-10-CM) - Expressive aphasia  THERAPY DIAG:  Aphasia  Apraxia following other cerebrovascular disease  Cognitive communication deficit  Rationale for Evaluation and Treatment: Rehabilitation  SUBJECTIVE:   SUBJECTIVE STATEMENT: "Sweetheart!"  Pt accompanied by: self and caregiver  PERTINENT HISTORY: Robin [name] is a pleasant 58 y.o. female with medical history significant for atrial fibrillation on Eliquis, chronic systolic CHF, CKD 3B, insulin-dependent diabetes mellitus, polysubstance abuse, COPD, GERD, HTN, and history of CVA with residual right-sided weakness and aphasia (L MCA CVA and L ICA occlusion s/p thrombectomy on 12/27/2019 followed by CIR stay where she received ST/OT/PT until d/c home on 02/02/20 and then received some [facility] SLP services). She is referred for outpatient SLP therapy by Robin Motto, PA to facilitate increased independence with communication.  PAIN:  Are you having pain? No  PATIENT GOALS: Increase ability to talk  OBJECTIVE:   DIAGNOSTIC FINDINGS:  MRI 11/13/2020: IMPRESSION: No evidence of acute intracranial abnormality, including acute infarction.   Redemonstrated large chronic left MCA territory infarct.   Redemonstrated small chronic cortically based infarct within the posterior right temporal lobe.   Mild bilateral ethmoid sinus mucosal thickening.  PATIENT EDUCATION: Education details: Herbalist the [employer] educated: Patient and sister, Robin [name] Education method: Explanation and Handouts Education comprehension: verbalized understanding  and needs further education   GOALS: Goals reviewed with patient? Yes  SHORT TERM GOALS: Target date: 09/15/2023  Pt will increase verbal naming of common objects/pictures to 75% acc when provided with mod multimodality cues Baseline: 80% when allowed to write response or provided mod/max cues Goal status: MET for 100% acc for mod/max cues (phonemic cue provided); change to 100% acc with mod cues; met   2.  Pt will describe objects and pictures by providing at least three salient features (single words ok) as judged by clinician with 80% acc when provided mod cues. Baseline: 70% mod/max cues Goal status: MET for 100% with mod/max cues provided; change to 100% acc with mod cues; met   3.  Pt will complete single word sentence completion tasks [facility]) with 90% acc when provided with min/mod multimodality cues.  Baseline: 75% mod cues of initial phoneme modeled Goal status: MET for 100% acc with mi/mod cues provided; change to 100% with min cues; Partially met, d/c  4.  Pt will complete basic level reading comprehension  tasks (picture cues for sentence level) with 80% acc with provided mi/mod cues. Baseline: 100% word to picture matching f=5, 0/5 sentences Goal status: MET for single words, continue for sentences; Partially Met, use on device  5.  Pt will verbally describe action photos with use of "who" "what doing" template with 80% acc and min/mod cues.  Baseline: 70% mod cues. Goal status: MET for 100% acc with mod/max cues; change to 100% with mod cues; Met  6.  Pt will complete personally relevant responsive naming tasks with 90% acc with use of written cues as needed. Baseline: 75% acc Goal status: MET  7. Pt will use a communication device to cue themselves to speak (reading or imitating words) during 1:1 conversations with SLP with min/mod cues over 3 consecutive sessions.             Baseline: Max assist             Goal status: MET; change to min cues   8. Pt will respond to  personal preference questions by answering with alternative communication device with 90% acc and min assist.             Baseline: Max assist             Goal status: Met   9. Pt will use alternative means of communication (low tech and high tech) to maximize independence with communication and find a system that is most efficient for her.             Baseline: Trial of Lingraphica, Spoken App, letter board, etc             Goal status: MET/ONGOING   10. Pt will use alternative means of communication to locate list of medications, health symptoms, and personal/bio information with indirect cues.             Baseline: mod assist             Goal status: Partially Met/Continue with updated medications  11. Pt will reduce social isolation by using the device to share opinions, tell stories, and  maintain/develop social connections with indirect cues  Baseline: min/mod assist             Goal status: ONGOING  LONG TERM GOALS: Target date: 09/15/2023  Pt will communicate moderately complex wants/needs to [facility] with min assist from communication partner and use of multimodality communication strategies. Baseline: Pt requires mod assist from communication partner  Goal status: ONGOING   ASSESSMENT:  PREVIOUS TREATMENT: Pt accompanied to therapy by Mae. We worked on Water quality scientist and SLP assisted with navigation to specific categories (fruits, vegetables, meats, etc). Pt then independently selected items from each category to add to the grocery list folder (she came up with ~50+ items). She was then able to navigate to find the folder with indirect cues. She initiated that she likes to make "stew" when it is cold, so we created a folder for this including all of the ingredients. She initially communicated (verbalized and wrote) 2 ingredients and then used the Lingraphica to locate and communicate an additional 6 more ingredients. She needs assistance to create additional pages using  the "copy" function. She was then asked to navigate to specific categories and she needed mi/mod cues. She will need continued practice at home to use her device more efficiently. Continue to target goals.  CURRENT TREATMENT:  Pt accompanied to therapy by Mae. SLP asked Pt who drove her to  therapy, since [employer] wasn't running on [place] day. She was unable verbalize a response, until she was cued to write a response using the dry erase board on her Lingraphica ("sweetheart"). SLP wanted a few new icons added, which included some local restaurants and food items. She was able to select toppings for her pizza (pepperoni, sausage, onions, and cheese) and then navigate to the folder with written cues. SLP created flash cards for Pt to practice device navigation with work flow listed on the back of each card. She completed this task with 90% acc. Pt cued to use the icons to prompt her to speak. She had difficulty initiating the initial phoneme and needed assist from SLP via visual model and phonetic placement cues. Pt given flash cards to practice for HEP.                                                                                                       DATE: 08/27/23  CLINICAL IMPRESSION: (from initial evaluation on 03/19/2023) Patient is a 58 y.o. female who was seen today for speech/language evaluation s/p CVA in May of 2021 with only limited follow up SLP therapy via home health in 2021 when she was staying with her daughter. [name] sister accompanied her to the evaluation and would like for her sister to be able to communicate her wants and needs verbally. Pt presents with moderate expressive aphasia and suspect apraxia and mi/mod receptive aphasia with relative strengths in her ability to write single word responses during naming tasks, use of gestures, automatic speech tasks, repetition of single, high frequency words, responding accurately to basic yes/no questions, following basic 1 step directions, and object  recognition. Pt has difficulty verbally labeling high frequency objects (but was able to write some responses), repeating longer words/phrases, and reading phrases. She was able to match 5/5 high frequency objects to single words, but was only able to orally read/label 1/5 words. Pt benefited from gesturing to communicate her intent and this occasionally helped elicit word production and she also benefited from SLP phonemic cues and single word sentence completion cues. Recommend SLP therapy to address communication deficits and increase independence with communication 2x/week for 4-8 weeks.  OBJECTIVE IMPAIRMENTS: include executive functioning, expressive language, receptive language, aphasia, and apraxia. These impairments are limiting patient from managing medications, managing appointments, managing finances, and effectively communicating at home and in community. Factors affecting potential to achieve goals and functional outcome are ability to learn/carryover information, severity of impairments, and time post onset of aphasia (2021) . Patient will benefit from skilled SLP services to address above impairments and improve overall function.  REHAB POTENTIAL: Good  PLAN:  SLP FREQUENCY: 2x/week  SLP DURATION: 8 weeks  PLANNED INTERVENTIONS: Cueing hierachy, Internal/external aids, Functional tasks, Multimodal communication approach, SLP instruction and feedback, Compensatory strategies, Patient/family education, and Re-evaluation    Thank you,  Havery Moros, CCC-SLP [phone]  Cleophus Mendonsa, CCC-SLP 08/27/2023, 10:14 AM

## 2023-09-03 ENCOUNTER — Other Ambulatory Visit: Payer: Self-pay | Admitting: Physician Assistant

## 2023-09-04 ENCOUNTER — Ambulatory Visit (HOSPITAL_COMMUNITY): Payer: 59 | Admitting: Speech Pathology

## 2023-09-04 ENCOUNTER — Encounter (HOSPITAL_COMMUNITY): Payer: Self-pay | Admitting: Speech Pathology

## 2023-09-04 DIAGNOSIS — I69890 Apraxia following other cerebrovascular disease: Secondary | ICD-10-CM

## 2023-09-04 DIAGNOSIS — R4701 Aphasia: Secondary | ICD-10-CM

## 2023-09-04 DIAGNOSIS — R4781 Slurred speech: Secondary | ICD-10-CM | POA: Diagnosis not present

## 2023-09-04 DIAGNOSIS — R41841 Cognitive communication deficit: Secondary | ICD-10-CM

## 2023-09-04 DIAGNOSIS — I69398 Other sequelae of cerebral infarction: Secondary | ICD-10-CM | POA: Diagnosis not present

## 2023-09-04 NOTE — Therapy (Signed)
OUTPATIENT SPEECH LANGUAGE PATHOLOGY TREATMENT   Patient Name: Robin [name] MRN: [id number] DOB:[birth year], 58 y.o., female Today's Date: 09/04/2023  PCP: Jarold Motto, PA REFERRING PROVIDER: Jarold Motto, PA  END OF SESSION:  End of Session - 09/04/23 0939     Visit Number 32    Number of Visits 34    Date for SLP Re-Evaluation 09/15/23    Authorization Type UHC Medicare and Medicaid secondary; Dual complete   eff 08/05/22 ded 240 met oop 8850 met 2034.20 limit-no auth-no co ins-20%   SLP Start Time 0930    SLP Stop Time  1015    SLP Time Calculation (min) 45 min    Activity Tolerance Patient tolerated treatment well             Past Medical History:  Diagnosis Date   Allergy    Anemia    Aortic stenosis    a. 02/2023 Echo: mild-mod AS.   Arthritis    Chronic abdominal pain    Chronic HFrEF (heart failure with reduced ejection fraction) (HCC)    a. EF at 30-35% by echo in 08/2018 b. EF at 35% by repeat echo in 05/2019 c. EF improved to 65-70% in 2021 d. EF at 40-45% in 08/2021; e. 02/2023 Echo: 40-45%, glob HK, mod LVH, nl RV size/fx, sev BAE, mild-mod AS.   Cocaine abuse (HCC)    COPD (chronic obstructive pulmonary disease) (HCC)    Diabetes mellitus without complication (HCC)    Essential hypertension, benign    Expressive aphasia    post CVA   Fatty liver    GERD (gastroesophageal reflux disease)    Gout 2016   Normal coronary arteries    a. 10/2008 abnl MV; b. 10/2008 Cath: nl cors.   Ovarian cyst    Permanent atrial fibrillation (HCC)    Stroke (HCC) 12/26/2019   Right sided weakness, and expressive aphasia   Thoracic ascending aortic aneurysm (HCC)    a. 4 cm 10/31/19 CTA; b. 02/2023 Echo: Nl Ao root.   Type 2 diabetes mellitus (HCC)    type II   Past Surgical History:  Procedure Laterality Date   ABDOMINAL HYSTERECTOMY  09/10/2011   Procedure: HYSTERECTOMY ABDOMINAL;  Surgeon: Tilda Burrow, MD;  Location: [facility];  Service: Gynecology;   Laterality: N/A;  Abdominal hysterectomy   CESAREAN SECTION  [phone], and 1994   CHOLECYSTECTOMY  1995   IR 3D INDEPENDENT WKST  03/16/2020   IR ANGIO INTRA EXTRACRAN SEL INTERNAL CAROTID BILAT MOD SED  03/16/2020   IR ANGIO VERTEBRAL SEL SUBCLAVIAN INNOMINATE UNI L MOD SED  03/16/2020   IR ANGIO VERTEBRAL SEL VERTEBRAL UNI R MOD SED  03/16/2020   IR CT HEAD LTD  12/27/2019   IR PERCUTANEOUS ART THROMBECTOMY/INFUSION INTRACRANIAL INC DIAG ANGIO  12/27/2019   IR US GUIDE VASC ACCESS RIGHT  12/27/2019   IR US GUIDE VASC ACCESS RIGHT  03/16/2020   RADIOLOGY WITH ANESTHESIA N/A 12/27/2019   Procedure: IR WITH ANESTHESIA;  Surgeon: Julieanne Cotton, MD;  Location: [facility];  Service: Radiology;  Laterality: N/A;   SCAR REVISION  09/10/2011   Procedure: SCAR REVISION;  Surgeon: Tilda Burrow, MD;  Location: [facility];  Service: Gynecology;  Laterality: N/A;  Wide Excision of old Cicatrix   TUBAL LIGATION  1994   Patient Active Problem List   Diagnosis Date Noted   Acute on chronic systolic CHF (congestive heart failure) (HCC) 11/27/2021   Stage 3b chronic kidney disease (CKD) (HCC)  11/27/2021   Microcytic anemia 11/27/2021   Lactic acidosis 11/27/2021   Diabetes mellitus (HCC) 03/09/2021   Type 2 diabetes mellitus with stage 3a chronic kidney disease, with long-term current use of insulin (HCC) 03/09/2021   Acute metabolic encephalopathy 12/31/2020   Hyperglycemia 12/31/2020   Prolonged QT interval 05/12/2020   History of CVA with residual deficit 05/12/2020   Atrial fibrillation, chronic (HCC) 05/12/2020   Right hemiparesis (HCC)    Essential hypertension    Chronic diastolic congestive heart failure (HCC)    Dysphagia due to recent stroke 01/05/2020   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 01/05/2020   Embolic cerebral infarction [facility]) s/p clot retrieval 12/27/2019   Thoracic aortic aneurysm without rupture (HCC)    Atrial fibrillation with RVR (HCC) 05/10/2019   Type 2 diabetes  mellitus with diabetic autonomic neuropathy, with long-term current use of insulin (HCC) 09/01/2018   Cocaine abuse (HCC) 08/25/2018   Cigarette nicotine dependence, uncomplicated 06/07/2015   GERD (gastroesophageal reflux disease) 06/07/2011   COPD (chronic obstructive pulmonary disease) (HCC) 06/07/2011   Arthritis 06/07/2011    ONSET DATE: May 2021   REFERRING DIAG: R47.01 (ICD-10-CM) - Expressive aphasia  THERAPY DIAG:  Aphasia  Apraxia following other cerebrovascular disease  Cognitive communication deficit  Rationale for Evaluation and Treatment: Rehabilitation  SUBJECTIVE:   SUBJECTIVE STATEMENT: "It won't turn on."  Pt accompanied by: self and caregiver  PERTINENT HISTORY: Robin [name] is a pleasant 58 y.o. female with medical history significant for atrial fibrillation on Eliquis, chronic systolic CHF, CKD 3B, insulin-dependent diabetes mellitus, polysubstance abuse, COPD, GERD, HTN, and history of CVA with residual right-sided weakness and aphasia (L MCA CVA and L ICA occlusion s/p thrombectomy on 12/27/2019 followed by CIR stay where she received ST/OT/PT until d/c home on 02/02/20 and then received some [facility] SLP services). She is referred for outpatient SLP therapy by Jarold Motto, PA to facilitate increased independence with communication.  PAIN:  Are you having pain? No  PATIENT GOALS: Increase ability to talk  OBJECTIVE:   DIAGNOSTIC FINDINGS:  MRI 11/13/2020: IMPRESSION: No evidence of acute intracranial abnormality, including acute infarction.   Redemonstrated large chronic left MCA territory infarct.   Redemonstrated small chronic cortically based infarct within the posterior right temporal lobe.   Mild bilateral ethmoid sinus mucosal thickening.  PATIENT EDUCATION: Education details: Herbalist the [employer] educated: Patient and sister, Robin [name] Education method: Explanation and Handouts Education comprehension: verbalized  understanding and needs further education   GOALS: Goals reviewed with patient? Yes  SHORT TERM GOALS: Target date: 09/15/2023  Pt will increase verbal naming of common objects/pictures to 75% acc when provided with mod multimodality cues Baseline: 80% when allowed to write response or provided mod/max cues Goal status: MET for 100% acc for mod/max cues (phonemic cue provided); change to 100% acc with mod cues; met   2.  Pt will describe objects and pictures by providing at least three salient features (single words ok) as judged by clinician with 80% acc when provided mod cues. Baseline: 70% mod/max cues Goal status: MET for 100% with mod/max cues provided; change to 100% acc with mod cues; met   3.  Pt will complete single word sentence completion tasks [facility]) with 90% acc when provided with min/mod multimodality cues.  Baseline: 75% mod cues of initial phoneme modeled Goal status: MET for 100% acc with mi/mod cues provided; change to 100% with min cues; Partially met, d/c  4.  Pt will complete basic  level reading comprehension tasks (picture cues for sentence level) with 80% acc with provided mi/mod cues. Baseline: 100% word to picture matching f=5, 0/5 sentences Goal status: MET for single words, continue for sentences; Partially Met, use on device  5.  Pt will verbally describe action photos with use of "who" "what doing" template with 80% acc and min/mod cues.  Baseline: 70% mod cues. Goal status: MET for 100% acc with mod/max cues; change to 100% with mod cues; Met  6.  Pt will complete personally relevant responsive naming tasks with 90% acc with use of written cues as needed. Baseline: 75% acc Goal status: MET  7. Pt will use a communication device to cue themselves to speak (reading or imitating words) during 1:1 conversations with SLP with min/mod cues over 3 consecutive sessions.             Baseline: Max assist             Goal status: MET; change to min cues   8. Pt  will respond to personal preference questions by answering with alternative communication device with 90% acc and min assist.             Baseline: Max assist             Goal status: Met   9. Pt will use alternative means of communication (low tech and high tech) to maximize independence with communication and find a system that is most efficient for her.             Baseline: Trial of Lingraphica, Spoken App, letter board, etc             Goal status: MET/ONGOING   10. Pt will use alternative means of communication to locate list of medications, health symptoms, and personal/bio information with indirect cues.             Baseline: mod assist             Goal status: Partially Met/Continue with updated medications  11. Pt will reduce social isolation by using the device to share opinions, tell stories, and  maintain/develop social connections with indirect cues  Baseline: min/mod assist             Goal status: ONGOING  LONG TERM GOALS: Target date: 09/15/2023  Pt will communicate moderately complex wants/needs to [facility] with min assist from communication partner and use of multimodality communication strategies. Baseline: Pt requires mod assist from communication partner  Goal status: ONGOING   ASSESSMENT:  PREVIOUS TREATMENT: Pt accompanied to therapy by Robin [name]. SLP assisted Pt in creating a Lingraphica log in and password combination so that her data can be stored in the "therapy" section. Pt was then asked to locate specific cards/folders: Facebook, plants, children, PCP, medication, and hobbies. She is not yet independent in doing this, but is able to follow the written cues provided on the back of the index cards to achieve 100% acc. She selects the "start" button when she should select the "back" button and was cued to practice this. She indicated that she used her Lingraphica device to tell her caregiver that she wanted to go to [employer] and selected the appropriate icons to say  what kind of pizza she wanted. Robin [name] used her Lingraphica device to tell me which caregiver took her to [employer]. SLP continues to encourage her to use the icon voicing property to help initiate verbal production. She will have two more sessions prior to  d/c and her sister will accompany her to her last session. I would like for Robin [name] to bring her Lingraphica with her to all MD appointments to facilitate communication with her medical team.  CURRENT TREATMENT:  Pt accompanied to therapy by a different caregiver this date, Robin [name]. Robin [name] turned on her Lingraphica device and independently began searching for something on the device. It took her several minutes, but she eventually selected the icon that stated her name and pointed to her caregiver. She was looking for the name of her caregiver. Once SLP encouraged her to look in the "people" folder under "[employer]" she navigated and found her name. SLP created a list of 21 questions (with assist from caregiver to add specific functional questions) for Pt to answer with use of her device and verbal responses as able. Pt again reminded to use the device to help elicit a verbal response. She answered the questions with 100% acc with use of mi/mod cues from SLP for clarification and/or device navigation. She was given a copy of the questions to practice at home with her caregivers. She also added 5 TV shows to the device with assist. Plan for one more SLP session next week with her sister in attendance.                                                                                                       DATE: 09/04/23  CLINICAL IMPRESSION: (from initial evaluation on 03/19/2023) Patient is a 58 y.o. female who was seen today for speech/language evaluation s/p CVA in May of 2021 with only limited follow up SLP therapy via home health in 2021 when she was staying with her daughter. [name] sister accompanied her to the evaluation and would like for  her sister to be able to communicate her wants and needs verbally. 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 Pt has difficulty verbally labeling high frequency objects (but was able to write some responses), repeating longer words/phrases, and reading phrases. She was able to match 5/5 high frequency objects to single words, but was only able to orally read/label 1/5 words. Pt benefited from gesturing to communicate her intent and this occasionally helped elicit word production and she also benefited from SLP phonemic cues and single word sentence completion cues. Recommend SLP therapy to address communication deficits and increase independence with communication 2x/week for 4-8 weeks.  OBJECTIVE IMPAIRMENTS: include executive functioning, expressive language, receptive language, aphasia, and apraxia. These impairments are limiting patient from managing medications, managing appointments, managing finances, and effectively communicating at home and in community. Factors affecting potential to achieve goals and functional outcome are ability to learn/carryover information, severity of impairments, and time post onset of aphasia (2021) . Patient will benefit from skilled SLP services to address above impairments and improve overall function.  REHAB POTENTIAL: Good  PLAN:  SLP FREQUENCY: 1x/week  SLP DURATION: 2 weeks  PLANNED INTERVENTIONS: Cueing hierachy, Internal/external aids, Functional tasks, Multimodal communication approach, SLP instruction and feedback, Compensatory strategies, Patient/family education, and Re-evaluation    Thank you,  Robin [name],  CCC-SLP [phone]  Robin [name], CCC-SLP 09/04/2023, 9:39 AM

## 2023-09-07 ENCOUNTER — Inpatient Hospital Stay (HOSPITAL_COMMUNITY)
Admission: EM | Admit: 2023-09-07 | Discharge: 2023-09-10 | DRG: 057 | Disposition: A | Discharge status: Skilled Nursing Facility | Payer: 59 | Attending: Internal Medicine | Admitting: Internal Medicine

## 2023-09-07 ENCOUNTER — Other Ambulatory Visit: Payer: Self-pay

## 2023-09-07 ENCOUNTER — Emergency Department (HOSPITAL_COMMUNITY): Payer: 59

## 2023-09-07 DIAGNOSIS — I69320 Aphasia following cerebral infarction: Secondary | ICD-10-CM | POA: Diagnosis not present

## 2023-09-07 DIAGNOSIS — Z7984 Long term (current) use of oral hypoglycemic drugs: Secondary | ICD-10-CM | POA: Diagnosis not present

## 2023-09-07 DIAGNOSIS — Z833 Family history of diabetes mellitus: Secondary | ICD-10-CM | POA: Diagnosis not present

## 2023-09-07 DIAGNOSIS — Z7985 Long-term (current) use of injectable non-insulin antidiabetic drugs: Secondary | ICD-10-CM

## 2023-09-07 DIAGNOSIS — I7121 Aneurysm of the ascending aorta, without rupture: Secondary | ICD-10-CM | POA: Diagnosis present

## 2023-09-07 DIAGNOSIS — J449 Chronic obstructive pulmonary disease, unspecified: Secondary | ICD-10-CM | POA: Diagnosis present

## 2023-09-07 DIAGNOSIS — Z1152 Encounter for screening for COVID-19: Secondary | ICD-10-CM

## 2023-09-07 DIAGNOSIS — I69351 Hemiplegia and hemiparesis following cerebral infarction affecting right dominant side: Secondary | ICD-10-CM

## 2023-09-07 DIAGNOSIS — E1159 Type 2 diabetes mellitus with other circulatory complications: Secondary | ICD-10-CM

## 2023-09-07 DIAGNOSIS — Z880 Allergy status to penicillin: Secondary | ICD-10-CM | POA: Diagnosis not present

## 2023-09-07 DIAGNOSIS — F1410 Cocaine abuse, uncomplicated: Secondary | ICD-10-CM | POA: Diagnosis present

## 2023-09-07 DIAGNOSIS — Z7901 Long term (current) use of anticoagulants: Secondary | ICD-10-CM

## 2023-09-07 DIAGNOSIS — E66812 Obesity, class 2: Secondary | ICD-10-CM | POA: Diagnosis present

## 2023-09-07 DIAGNOSIS — R569 Unspecified convulsions: Principal | ICD-10-CM

## 2023-09-07 DIAGNOSIS — R4781 Slurred speech: Principal | ICD-10-CM

## 2023-09-07 DIAGNOSIS — I69398 Other sequelae of cerebral infarction: Secondary | ICD-10-CM | POA: Diagnosis not present

## 2023-09-07 DIAGNOSIS — Z90710 Acquired absence of both cervix and uterus: Secondary | ICD-10-CM

## 2023-09-07 DIAGNOSIS — Z7982 Long term (current) use of aspirin: Secondary | ICD-10-CM | POA: Diagnosis not present

## 2023-09-07 DIAGNOSIS — Z9049 Acquired absence of other specified parts of digestive tract: Secondary | ICD-10-CM

## 2023-09-07 DIAGNOSIS — Z79899 Other long term (current) drug therapy: Secondary | ICD-10-CM | POA: Diagnosis not present

## 2023-09-07 DIAGNOSIS — G40109 Localization-related (focal) (partial) symptomatic epilepsy and epileptic syndromes with simple partial seizures, not intractable, without status epilepticus: Principal | ICD-10-CM | POA: Diagnosis present

## 2023-09-07 DIAGNOSIS — I4821 Permanent atrial fibrillation: Secondary | ICD-10-CM | POA: Diagnosis present

## 2023-09-07 DIAGNOSIS — Z794 Long term (current) use of insulin: Secondary | ICD-10-CM | POA: Diagnosis not present

## 2023-09-07 DIAGNOSIS — Z6837 Body mass index (BMI) 37.0-37.9, adult: Secondary | ICD-10-CM

## 2023-09-07 DIAGNOSIS — E1122 Type 2 diabetes mellitus with diabetic chronic kidney disease: Secondary | ICD-10-CM | POA: Diagnosis present

## 2023-09-07 DIAGNOSIS — R5381 Other malaise: Secondary | ICD-10-CM | POA: Diagnosis present

## 2023-09-07 DIAGNOSIS — E860 Dehydration: Secondary | ICD-10-CM | POA: Diagnosis present

## 2023-09-07 DIAGNOSIS — I6340 Cerebral infarction due to embolism of unspecified cerebral artery: Secondary | ICD-10-CM | POA: Diagnosis present

## 2023-09-07 DIAGNOSIS — Z87891 Personal history of nicotine dependence: Secondary | ICD-10-CM | POA: Diagnosis not present

## 2023-09-07 DIAGNOSIS — N1832 Chronic kidney disease, stage 3b: Secondary | ICD-10-CM | POA: Diagnosis present

## 2023-09-07 DIAGNOSIS — E119 Type 2 diabetes mellitus without complications: Secondary | ICD-10-CM

## 2023-09-07 DIAGNOSIS — Z91030 Bee allergy status: Secondary | ICD-10-CM

## 2023-09-07 DIAGNOSIS — I10 Essential (primary) hypertension: Secondary | ICD-10-CM | POA: Diagnosis not present

## 2023-09-07 DIAGNOSIS — R509 Fever, unspecified: Secondary | ICD-10-CM

## 2023-09-07 DIAGNOSIS — M79601 Pain in right arm: Secondary | ICD-10-CM | POA: Diagnosis present

## 2023-09-07 DIAGNOSIS — I5032 Chronic diastolic (congestive) heart failure: Secondary | ICD-10-CM | POA: Diagnosis present

## 2023-09-07 DIAGNOSIS — R6510 Systemic inflammatory response syndrome (SIRS) of non-infectious origin without acute organ dysfunction: Secondary | ICD-10-CM | POA: Diagnosis present

## 2023-09-07 DIAGNOSIS — I482 Chronic atrial fibrillation, unspecified: Secondary | ICD-10-CM | POA: Diagnosis not present

## 2023-09-07 DIAGNOSIS — Z888 Allergy status to other drugs, medicaments and biological substances status: Secondary | ICD-10-CM

## 2023-09-07 DIAGNOSIS — Z811 Family history of alcohol abuse and dependence: Secondary | ICD-10-CM

## 2023-09-07 DIAGNOSIS — I130 Hypertensive heart and chronic kidney disease with heart failure and stage 1 through stage 4 chronic kidney disease, or unspecified chronic kidney disease: Secondary | ICD-10-CM | POA: Diagnosis present

## 2023-09-07 LAB — RAPID URINE DRUG SCREEN, HOSP PERFORMED
Amphetamines: NOT DETECTED
Barbiturates: NOT DETECTED
Benzodiazepines: NOT DETECTED
Cocaine: NOT DETECTED
Opiates: NOT DETECTED
Tetrahydrocannabinol: NOT DETECTED

## 2023-09-07 LAB — URINALYSIS, ROUTINE W REFLEX MICROSCOPIC
Bacteria, UA: NONE SEEN
Bilirubin Urine: NEGATIVE
Glucose, UA: >=500 mg/dL — AB
Hgb urine dipstick: NEGATIVE
Ketones, ur: NEGATIVE mg/dL
Leukocytes,Ua: NEGATIVE
Nitrite: NEGATIVE
Protein, ur: NEGATIVE mg/dL
Specific Gravity, Urine: 1.015 (ref 1.005–1.030)
pH: 5 (ref 5.0–8.0)

## 2023-09-07 LAB — DIFFERENTIAL
Abs Immature Granulocytes: 0.02 10*3/uL (ref 0.00–0.07)
Basophils Absolute: 0 10*3/uL (ref 0.0–0.1)
Basophils Relative: 1 %
Eosinophils Absolute: 0.2 10*3/uL (ref 0.0–0.5)
Eosinophils Relative: 3 %
Immature Granulocytes: 0 %
Lymphocytes Relative: 27 %
Lymphs Abs: 1.9 10*3/uL (ref 0.7–4.0)
Monocytes Absolute: 0.8 10*3/uL (ref 0.1–1.0)
Monocytes Relative: 11 %
Neutro Abs: 4 10*3/uL (ref 1.7–7.7)
Neutrophils Relative %: 58 %

## 2023-09-07 LAB — CBC
HCT: 44.1 % (ref 36.0–46.0)
Hemoglobin: 14.1 g/dL (ref 12.0–15.0)
MCH: 27.2 pg (ref 26.0–34.0)
MCHC: 32 g/dL (ref 30.0–36.0)
MCV: 85 fL (ref 80.0–100.0)
Platelets: 250 10*3/uL (ref 150–400)
RBC: 5.19 MIL/uL — ABNORMAL HIGH (ref 3.87–5.11)
RDW: 15.3 % (ref 11.5–15.5)
WBC: 7 10*3/uL (ref 4.0–10.5)
nRBC: 0 % (ref 0.0–0.2)

## 2023-09-07 LAB — COMPREHENSIVE METABOLIC PANEL
ALT: 13 U/L (ref 0–44)
AST: 22 U/L (ref 15–41)
Albumin: 3.6 g/dL (ref 3.5–5.0)
Alkaline Phosphatase: 84 U/L (ref 38–126)
Anion gap: 10 (ref 5–15)
BUN: 21 mg/dL — ABNORMAL HIGH (ref 6–20)
CO2: 22 mmol/L (ref 22–32)
Calcium: 9.3 mg/dL (ref 8.9–10.3)
Chloride: 104 mmol/L (ref 98–111)
Creatinine, Ser: 1.89 mg/dL — ABNORMAL HIGH (ref 0.44–1.00)
GFR, Estimated: 31 mL/min — ABNORMAL LOW (ref >60)
Glucose, Bld: 177 mg/dL — ABNORMAL HIGH (ref 70–99)
Potassium: 4.2 mmol/L (ref 3.5–5.1)
Sodium: 136 mmol/L (ref 135–145)
Total Bilirubin: 0.7 mg/dL (ref 0.0–1.2)
Total Protein: 7.2 g/dL (ref 6.5–8.1)

## 2023-09-07 LAB — RESP PANEL BY RT-PCR (RSV, FLU A&B, COVID)  RVPGX2
Influenza A by PCR: NEGATIVE
Influenza B by PCR: NEGATIVE
Resp Syncytial Virus by PCR: NEGATIVE
SARS Coronavirus 2 by RT PCR: NEGATIVE

## 2023-09-07 LAB — CBG MONITORING, ED: Glucose-Capillary: 119 mg/dL — ABNORMAL HIGH (ref 70–99)

## 2023-09-07 LAB — PROTIME-INR
INR: 1.5 — ABNORMAL HIGH (ref 0.8–1.2)
Prothrombin Time: 18.6 s — ABNORMAL HIGH (ref 11.4–15.2)

## 2023-09-07 LAB — MAGNESIUM: Magnesium: 2 mg/dL (ref 1.7–2.4)

## 2023-09-07 LAB — CK: Total CK: 115 U/L (ref 38–234)

## 2023-09-07 LAB — PROCALCITONIN: Procalcitonin: <0.1 ng/mL

## 2023-09-07 LAB — ETHANOL: Alcohol, Ethyl (B): <10 mg/dL (ref <10)

## 2023-09-07 LAB — APTT: aPTT: 31 s (ref 24–36)

## 2023-09-07 LAB — GLUCOSE, CAPILLARY: Glucose-Capillary: 201 mg/dL — ABNORMAL HIGH (ref 70–99)

## 2023-09-07 LAB — LACTIC ACID, PLASMA: Lactic Acid, Venous: 0.8 mmol/L (ref 0.5–1.9)

## 2023-09-07 MED ORDER — ACETAMINOPHEN 10 MG/ML IV SOLN
1000.0000 mg | Freq: Four times a day (QID) | INTRAVENOUS | Status: DC
  Filled 2023-09-07 (×3): qty 100

## 2023-09-07 MED ORDER — HYDROCODONE-ACETAMINOPHEN 5-325 MG PO TABS
1.0000 | ORAL_TABLET | Freq: Once | ORAL | Status: AC
  Administered 2023-09-08: 1 via ORAL
  Filled 2023-09-07: qty 1

## 2023-09-07 MED ORDER — SODIUM CHLORIDE 0.9 % IV SOLN
250.0000 mg | Freq: Two times a day (BID) | INTRAVENOUS | Status: DC
  Filled 2023-09-07 (×4): qty 2.5

## 2023-09-07 MED ORDER — INSULIN GLARGINE-YFGN 100 UNIT/ML ~~LOC~~ SOLN
15.0000 [IU] | Freq: Every day | SUBCUTANEOUS | Status: DC
  Administered 2023-09-08 – 2023-09-10 (×3): 15 [IU] via SUBCUTANEOUS
  Filled 2023-09-07 (×4): qty 0.15

## 2023-09-07 MED ORDER — SODIUM CHLORIDE 0.9 % IV SOLN: INTRAVENOUS | Status: AC

## 2023-09-07 MED ORDER — INSULIN ASPART 100 UNIT/ML IJ SOLN: 0.0000 [IU] | Freq: Every day | INTRAMUSCULAR | Status: DC

## 2023-09-07 MED ORDER — ONDANSETRON HCL 4 MG PO TABS: 4.0000 mg | ORAL_TABLET | Freq: Four times a day (QID) | ORAL | Status: DC | PRN

## 2023-09-07 MED ORDER — CARVEDILOL 12.5 MG PO TABS
25.0000 mg | ORAL_TABLET | Freq: Two times a day (BID) | ORAL | Status: DC
  Administered 2023-09-08 – 2023-09-10 (×6): 25 mg via ORAL
  Filled 2023-09-07 (×6): qty 2

## 2023-09-07 MED ORDER — ACETAMINOPHEN 325 MG PO TABS
650.0000 mg | ORAL_TABLET | Freq: Four times a day (QID) | ORAL | Status: DC | PRN
  Administered 2023-09-08 (×2): 650 mg via ORAL
  Filled 2023-09-07 (×2): qty 2

## 2023-09-07 MED ORDER — POLYETHYLENE GLYCOL 3350 17 G PO PACK: 17.0000 g | PACK | Freq: Every day | ORAL | Status: DC | PRN

## 2023-09-07 MED ORDER — LEVETIRACETAM 250 MG PO TABS
250.0000 mg | ORAL_TABLET | Freq: Two times a day (BID) | ORAL | Status: DC
Start: 2023-09-07 — End: 2023-09-10
  Administered 2023-09-08 – 2023-09-10 (×6): 250 mg via ORAL
  Filled 2023-09-07 (×6): qty 1

## 2023-09-07 MED ORDER — ONDANSETRON HCL 4 MG/2ML IJ SOLN: 4.0000 mg | Freq: Four times a day (QID) | INTRAMUSCULAR | Status: DC | PRN

## 2023-09-07 MED ORDER — LORAZEPAM 2 MG/ML IJ SOLN: 2.0000 mg | INTRAMUSCULAR | Status: DC | PRN

## 2023-09-07 MED ORDER — INSULIN ASPART 100 UNIT/ML IJ SOLN
0.0000 [IU] | Freq: Three times a day (TID) | INTRAMUSCULAR | Status: DC
  Administered 2023-09-08 (×2): 2 [IU] via SUBCUTANEOUS
  Administered 2023-09-08 – 2023-09-09 (×2): 1 [IU] via SUBCUTANEOUS
  Administered 2023-09-09 – 2023-09-10 (×2): 2 [IU] via SUBCUTANEOUS
  Administered 2023-09-10: 1 [IU] via SUBCUTANEOUS

## 2023-09-07 MED ORDER — FAMOTIDINE 20 MG PO TABS
20.0000 mg | ORAL_TABLET | Freq: Two times a day (BID) | ORAL | Status: DC
  Administered 2023-09-08 – 2023-09-10 (×6): 20 mg via ORAL
  Filled 2023-09-07 (×6): qty 1

## 2023-09-07 MED ORDER — SODIUM CHLORIDE 0.9 % IV SOLN
250.0000 mg | Freq: Once | INTRAVENOUS | Status: AC
  Administered 2023-09-07: 250 mg via INTRAVENOUS
  Filled 2023-09-07: qty 2.5

## 2023-09-07 MED ORDER — ACETAMINOPHEN 650 MG RE SUPP: 650.0000 mg | Freq: Four times a day (QID) | RECTAL | Status: DC | PRN

## 2023-09-07 MED ORDER — APIXABAN 5 MG PO TABS
5.0000 mg | ORAL_TABLET | Freq: Two times a day (BID) | ORAL | Status: DC
  Administered 2023-09-08 – 2023-09-10 (×6): 5 mg via ORAL
  Filled 2023-09-07 (×6): qty 1

## 2023-09-07 NOTE — ED Triage Notes (Signed)
Pt arrived via [facility] from home, Code Stroke called by EMS, states caregiver stated that pt got up to use the bathroom and became weak so pt sat back down in chair, caregiver then noted a "twitch" of pts body that only lasted a few seconds, no hx of seizures. Pt does have a prior Hx of stroke with R side deficits andR  paralysis from that previous stroke. Slurred speech also noted by caregiver. Cbg 188 at bedside. MD at bedside

## 2023-09-07 NOTE — ED Provider Notes (Signed)
[name] Provider Note   CSN: [id number] Arrival date & time: 09/07/23  1312     History  Chief Complaint  Patient presents with   Code Stroke    Robin [name] is a 58 y.o. female.  58 year old female with past medical history with previous CVA with right-sided deficits at baseline presenting to the emergency department today with apparent acute onset of generalized weakness and aphasia.  Her family also reported that she was having some tremors in her right side.  This is a side where she had the previous stroke.  Her last known normal was 12:30 PM.  The patient did start with cough and congestion this morning according to family.  History is obtained via medics as the patient is aphasic.        Home Medications Prior to Admission medications   Medication Sig Start Date End Date Taking? Authorizing Provider  Accu-Chek Softclix Lancets lancets Use as instructed 06/13/21   Shamleffer, Konrad Dolores, MD  apixaban (ELIQUIS) 5 MG TABS tablet Take 1 tablet by mouth twice daily 09/04/23   Jarold Motto, PA  aspirin EC 81 MG tablet Take 1 tablet (81 mg total) by mouth daily. Swallow whole. 07/19/20   Strader, Lennart Pall, PA-C  Blood Glucose Monitoring Suppl (ACCU-CHEK GUIDE ME) w/Device KIT 1 Device by Does not apply route 3 (three) times daily. 03/09/21   Shamleffer, Konrad Dolores, MD  carvedilol (COREG) 25 MG tablet TAKE 2 TABLETS BY MOUTH TWICE DAILY WITH A MEAL 03/19/23   Antoine Poche, MD  cetaphil (CETAPHIL) lotion Apply 1 Application topically as needed for dry skin. 10/11/22   Sabas Sous, MD  dicyclomine (BENTYL) 20 MG tablet Take 1 tablet (20 mg total) by mouth 3 (three) times daily before meals. 08/18/23   Gilda Crease, MD  Dulaglutide (TRULICITY) 3 MG/0.5ML SOPN Inject 3 mg as directed once a week. 05/09/23   Shamleffer, Konrad Dolores, MD  empagliflozin (JARDIANCE) 25 MG TABS tablet Take 1 tablet (25 mg total)  by mouth daily before breakfast. 05/09/23   Shamleffer, Konrad Dolores, MD  famotidine (PEPCID) 20 MG tablet Take 1 tablet (20 mg total) by mouth 2 (two) times daily. 08/18/23   Gilda Crease, MD  ferrous sulfate (FEROSUL) 325 (65 FE) MG tablet Take 1 tablet by mouth once daily 07/17/23   Jarold Motto, PA  furosemide (LASIX) 20 MG tablet TAKE 1 TABLET BY MOUTH ONCE DAILY (MAY TAKE ADDITIONAL 20 MG IF WEIGHT GAIN IS OVER 2LBS OVERNIGHT OR 5 LBS IN A WEEK) 08/12/22   Antoine Poche, MD  gabapentin (NEURONTIN) 100 MG capsule TAKE 2 CAPSULES BY MOUTH THREE TIMES DAILY 08/14/23   Jarold Motto, PA  glucose blood (ACCU-CHEK GUIDE) test strip USE 1 STRIP TO CHECK GLUCOSE THREE TIMES DAILY 08/28/22   Shamleffer, Konrad Dolores, MD  hydrALAZINE (APRESOLINE) 25 MG tablet TAKE 1 TABLET BY MOUTH THREE TIMES DAILY 06/23/23   Jarold Motto, PA  insulin aspart (NOVOLOG FLEXPEN) 100 UNIT/ML FlexPen Inject 12 Units into the skin 3 (three) times daily with meals. 10/30/22   Shamleffer, Konrad Dolores, MD  insulin glargine (LANTUS SOLOSTAR) 100 UNIT/ML Solostar Pen Inject 44 Units into the skin daily. 10/30/22   Shamleffer, Konrad Dolores, MD  Insulin Pen Needle (BD PEN NEEDLE NANO U/F) 32G X 4 MM MISC Inject 1 Device into the skin in the morning, at noon, in the evening, and at bedtime. 10/30/22   Shamleffer, Konrad Dolores,  MD  isosorbide mononitrate (IMDUR) 30 MG 24 hr tablet Take 2 tablets by mouth once daily 06/23/23   Jarold Motto, PA  ondansetron (ZOFRAN-ODT) 4 MG disintegrating tablet 4mg  ODT q4 hours prn nausea/vomit 08/18/23   Pollina, Canary Brim, MD  pantoprazole (PROTONIX) 40 MG tablet Take 1 tablet by mouth once daily 06/23/23   Jarold Motto, PA  timolol (TIMOPTIC) 0.5 % ophthalmic solution Place 1 drop into both eyes 2 (two) times daily. 07/05/22   [provider]  tobramycin (TOBREX) 0.3 % ophthalmic solution 1 drop 4 (four) times daily. 04/16/23   [provider]      Allergies    Bee venom, Losartan, Naproxen, Penicillins, and Lisinopril    Review of Systems   Review of Systems  Reason unable to perform ROS: Aphasia/dysarthria.    Physical Exam Updated Vital Signs BP (!) 132/91   Pulse (!) 103   Temp 99.4 F (37.4 C) (Oral)   Resp 17   Wt 109 kg   LMP 08/21/2011   SpO2 96%   BMI 37.64 kg/m  Physical Exam Vitals and nursing note reviewed.   Gen: NAD Eyes: PERRL, EOMI HEENT: no oropharyngeal swelling Neck: trachea midline Resp: clear to auscultation bilaterally Card: RRR, no murmurs, rubs, or gallops Abd: nontender, nondistended Extremities: no calf tenderness, no edema Vascular: 2+ radial pulses bilaterally, 2+ DP pulses bilaterally Neuro: Neurologic exam is extremely difficult due to patient inability to understand commands, she has  Some mild rhythmic shaking of the right upper extremity, she will withdraw all 4 extremities due to pain and will move the left greater than the right Skin: no rashes Psyc: acting appropriately   ED Results / Procedures / Treatments   Labs (all labs ordered are listed, but only abnormal results are displayed) Labs Reviewed  PROTIME-INR - Abnormal; Notable for the following components:      Result Value   Prothrombin Time 18.6 (*)    INR 1.5 (*)    All other components within normal limits  CBC - Abnormal; Notable for the following components:   RBC 5.19 (*)    All other components within normal limits  COMPREHENSIVE METABOLIC PANEL - Abnormal; Notable for the following components:   Glucose, Bld 177 (*)    BUN 21 (*)    Creatinine, Ser 1.89 (*)    GFR, Estimated 31 (*)    All other components within normal limits  RESP PANEL BY RT-PCR (RSV, FLU A&B, COVID)  RVPGX2  ETHANOL  APTT  DIFFERENTIAL  RAPID URINE DRUG SCREEN, HOSP PERFORMED  MAGNESIUM  CK  URINALYSIS, ROUTINE W REFLEX MICROSCOPIC    EKG None  Radiology DG Chest Portable 1 View Result Date: 09/07/2023 CLINICAL  DATA:  Cough EXAM: PORTABLE CHEST 1 VIEW COMPARISON:  08/17/2023 FINDINGS: Cardiomegaly, vascular congestion. No confluent opacities, effusions or edema. No acute bony abnormality. IMPRESSION: Cardiomegaly, vascular congestion. Electronically Signed   By: Charlett Nose M.D.   On: 09/07/2023 15:41   CT HEAD CODE STROKE WO CONTRAST Result Date: 09/07/2023 CLINICAL DATA:  Code stroke. Aphasia and right-sided weakness. Seizure activity. History of stroke. EXAM: CT HEAD WITHOUT CONTRAST TECHNIQUE: Contiguous axial images were obtained from the base of the skull through the vertex without intravenous contrast. RADIATION DOSE REDUCTION: This exam was performed according to the departmental dose-optimization program which includes automated exposure control, adjustment of the mA and/or kV according to patient size and/or use of iterative reconstruction technique. COMPARISON:  Head CT 11/26/2021 FINDINGS: Brain: There  is no evidence of an acute infarct, intracranial hemorrhage, mass, midline shift, or extra-axial fluid collection. There is an unchanged large chronic infarct anteriorly in the left MCA territory with associated ex vacuo dilatation of the left lateral and third ventricles. The right lateral and fourth ventricles are normal in size. Vascular: No suspicious acute vascular hyperdensity. Skull: No fracture or suspicious osseous lesion. Sinuses/Orbits: Paranasal sinuses and mastoid air cells are clear. Unremarkable orbits. Other: None. ASPECTS ([place] Stroke Program Early CT Score) - Ganglionic level infarction (caudate, lentiform nuclei, internal capsule, insula, M1-M3 cortex): 7 - Supraganglionic infarction (M4-M6 cortex): 3 Total score (0-10 with 10 being normal): 10 These results were called by telephone at the time of interpretation on 09/07/2023 at 1:33 pm to Dr. Beckey Downing, who verbally acknowledged these results. IMPRESSION: 1. No evidence of acute intracranial abnormality. 2. Unchanged large chronic left  MCA infarct. Electronically Signed   By: Sebastian Ache M.D.   On: 09/07/2023 13:33    Procedures Procedures    Medications Ordered in ED Medications  levETIRAcetam (KEPPRA) 250 mg in sodium chloride 0.9 % 100 mL IVPB (has no administration in time range)    Or  levETIRAcetam (KEPPRA) tablet 250 mg (has no administration in time range)  LORazepam (ATIVAN) injection 2 mg (has no administration in time range)  acetaminophen (OFIRMEV) IV 1,000 mg (has no administration in time range)  levETIRAcetam (KEPPRA) 250 mg in sodium chloride 0.9 % 100 mL IVPB (0 mg Intravenous Stopped 09/07/23 1506)    ED Course/ Medical Decision Making/ A&P                                 Medical Decision Making 58 year old female with past medical history of CVA and hypertension presents the emergency department today with aphasia and some rhythmic shaking of her extremity.  Will further evaluate the patient here with a stroke workup.  Discussed her case with neurology for further recommendations.  Will add on an RSV/COVID/flu swab as well as a chest x-ray in addition to the basic stroke workup as it does appear that she did start with some infectious symptoms this morning.  She is on Eliquis so she is not a candidate for thrombolytics at this time.  The patient's CT scan is negative.  Labs are reassuring.  Chest x-ray does show some pulmonary edema.  Viral testing is pending as well as urinalysis pending at the time of signout.  MRI is ordered based on neurology recommendations.  They do recommend admission for full stroke workup and EEG.  On reassessment she is no longer having any seizure activity after the Keppra and is speaking clearly now.  Amount and/or Complexity of Data Reviewed Labs: ordered. Radiology: ordered.  Risk Prescription drug management.           Final Clinical Impression(s) / ED Diagnoses Final diagnoses:  Slurred speech    Rx / DC Orders ED Discharge Orders     None          Durwin Glaze, MD 09/07/23 1555

## 2023-09-07 NOTE — Assessment & Plan Note (Addendum)
Reported tremors to her right side.  Evaluated by teleneurologist Dr. Iver Nestle, via video evaluation observed focal right hand twitching concerning for focal seizure.  No history of seizures.  CT head- unchanged large chronic left MCA infarct. ??  Seizure provoked by systemic illness, in patient with low seizure threshold. -Following teleneurology recommendation, Keppra 250 IV x 1, continue to 50 twice daily -MRI brain no acute intracranial process, again demonstrates large chronic left MCA infarct -EEG -If patient not improving, or concern for continued seizure activity, transferred to [facility] for LTM EEG. -Seizure precautions - 2 mg Ativan IV as needed up to 2 doses ordered, please notify neurology if this is used; for seizure activity lasting greater than 5 minutes only. Addendum- Dr. Iver Nestle reached out to me-  no beds presently available at [facility], but if patient has continuous focal seizures, would recommend transferring to [facility], dose of Keppra can be increased to 750 -considering renal function, but to call and talk to neurology on call first , before change in medication dose.

## 2023-09-07 NOTE — Assessment & Plan Note (Addendum)
With fever of 100.7, tachycardia heart rate 93-103.  Also reports cough, congestion and malaise over the past couple of days.  Chest x-ray-cardiomegaly, vascular congestion.  UA- clean, not suggestive of UTI.  Abdomen appears benign.  COVID/influenza RSV -negative. ??  Viral etiology. -Check procalcitonin- < 0.1, hold off on antibiotics for now -Obtain blood cultures - N/s 75cc/hr x 12hrs

## 2023-09-07 NOTE — Assessment & Plan Note (Signed)
Stable.  Resume home regimen 

## 2023-09-07 NOTE — ED Notes (Addendum)
Stroke cart activated 1308

## 2023-09-07 NOTE — Consult Note (Signed)
[facility] Telemedicine Consult  Requesting Provider: Beckey Downing Consult Participants: Myself, patient, sister by phone, bedside nurse, Dr. Rhae Hammock, atrium nurse Location of the provider: [facility]  Location of the patient: [place]  This consult was provided via telemedicine with 2-way video and audio communication. The patient/family was informed that care would be provided in this way and agreed to receive care in this manner.   Chief Complaint:   HPI: [name] is a 58 year old woman with a past medical history significant for hypertension, diabetes, atrial fibrillation on Eliquis, left MCA stroke with residual aphasia and right arm greater than leg weakness (2021, in the setting of polysubstance abuse including cocaine abuse at the time with unclear adherence to Eliquis)  Per sister, caregiver reported that the patient had sudden onset reduced responsiveness with some right sided twitching that she has never had before.  Similar story was provided by EMS, although they were not clear on the details of how long the twitching lasted and whether it was a tremor etc, they noted it was pretty brief and she did not have further twitching for them during transport.  She was improving slightly and her ability to speak during transport.  Current baseline per family she requires assistance with bathing and dressing but is able to ambulate with a cane.  She continues to have significant aphasia but has been working with SLP to be able to communicate some of her needs with assistive devices  LKW: 12:30 PM Thrombolytic given?: No, Eliquis last taken this morning IR Thrombectomy? No, exam most consistent with seizure and baseline again impairment without clear signs of new LVO on exam Modified Rankin Scale: 4-Needs assistance to walk and tend to bodily needs Time of teleneurologist evaluation: 1:12 PM   Exam: Vitals:   09/07/23 1337 09/07/23 1340  BP:    Pulse: (!) 103   Resp: 17    Temp:  (!) 100.7 F (38.2 C)  SpO2: 96%     General: Lethargic but responsive to stimuli Pulmonary: breathing comfortably Cardiac: regular rate and rhythm on monitor   NIH Stroke scale 1A: Level of Consciousness - 2 1B: Ask Month and Age - 2 1C: 'Blink Eyes' & 'Squeeze Hands' - 2 2: Test Horizontal Extraocular Movements - 0 3: Test Visual Fields - 0 (blink to threat) 4: Test Facial Palsy - 1 5A: Test Left Arm Motor Drift - 0 5B: Test Right Arm Motor Drift - 3 6A: Test Left Leg Motor Drift - 3 6B: Test Right Leg Motor Drift - 3 7: Test Limb Ataxia - 0 8: Test Sensation - 1 9: Test Language/Aphasia- 2 10: Test Dysarthria - 1 11: Test Extinction/Inattention - 0 NIHSS score: 20   Imaging Reviewed:   Head CT personally reviewed, agree with radiology Large chronic left MCA infarct without evidence of acute intracranial abnormality  Labs reviewed in epic and pertinent values follow:  Basic Metabolic Panel: Recent Labs  Lab 09/07/23 1318  NA 136  K 4.2  CL 104  CO2 22  GLUCOSE 177*  BUN 21*  CREATININE 1.89*  CALCIUM 9.3  Similar to baseline creatinine, GFR approximately 30  CBC: Recent Labs  Lab 09/07/23 1318  WBC 7.0  NEUTROABS 4.0  HGB 14.1  HCT 44.1  MCV 85.0  PLT 250    Coagulation Studies: Recent Labs    09/07/23 1318  LABPROT 18.6*  INR 1.5*       Assessment: Limited exam given baseline aphasia, lethargy and video evaluation  however did observe focal right hand twitching concerning for focal seizure. Major risk factor of prior stroke; family denies any prior history of seizures.  At times new onset focal seizures can be easily controlled with initiation of an antiseizure medication, given her baseline renal function will start at a low dose to avoid over sedation, especially given her current lethargy.  Certainly she is expected to have a postictal Todd's phenomenon that would gradually resolve.  However would have a low threshold to  transfer for continuous EEG monitoring if she is not improving as she could certainly be at risk of subclinical seizures  Recommendations:  - Keppra 250 mg IV x 1 dose for now (ordered)  Estimated Creatinine Clearance: 41.8 mL/min (A) (by C-G formula based on SCr of 1.89 mg/dL (H)).   Typical dose: CrCl 30 to <50 mL/minute/1.73 m2: 250 to 750 mg every 12 hours. - Keppra 250 mg BID thereafter (ordered) - MRI brain to confirm no acute intracranial process - Routine EEG if she remains stable without further concern for seizure activity; transfer to [facility] for LTM EEG if not steadily improving or further concern for seizure activity arises - Infectious workup to assess for factors lowering seizure threshold - Seizure precautions, 2 mg Ativan IV as needed up to 2 doses ordered, please notify neurology if this is used; for seizure activity lasting greater than 5 minutes only - UDS, Mg, CK (treat as needed) - If further concern for seizure activity could give another 2 - 4 grams IV (depending on severity of activity), as well as increasing standing dose   Brooke Dare MD-PhD [facility] [phone]   If 8pm-8am, please page neurology on call as listed in [facility].  CRITICAL CARE Performed by: Gordy Councilman   Total critical care time: 40 minutes  Critical care time was exclusive of separately billable procedures and treating other patients.  Critical care was necessary to treat or prevent imminent or life-threatening deterioration; emergent evaluation for consideration of thrombolytic or thrombectomy  Critical care was time spent personally by me on the following activities: development of treatment plan with patient and/or surrogate as well as nursing, discussions with consultants, evaluation of patient's response to treatment, examination of patient, obtaining history from patient or surrogate, ordering and performing treatments and interventions, ordering and review of  laboratory studies, ordering and review of radiographic studies, pulse oximetry and re-evaluation of patient's condition.

## 2023-09-07 NOTE — Assessment & Plan Note (Signed)
Stable and Compensated.  Last echo 02/2023 EF of 40 to 45%. -Hold Lasix 20 mg daily, she appears dehydrated.

## 2023-09-07 NOTE — Assessment & Plan Note (Addendum)
-   Hgba1c - SSI- S -Resume home Lantus at reduced dose 15 units daily (44 units home dose)

## 2023-09-07 NOTE — Assessment & Plan Note (Addendum)
Stable. -Patient does not know the medication she takes, med reconciliation pending, resume carvedilol for now

## 2023-09-07 NOTE — Assessment & Plan Note (Signed)
History of left MCA with residual right-sided deficits and slurred speech.  Lives at home,. -Resume Eliquis

## 2023-09-07 NOTE — ED Notes (Signed)
[facility] page was complete after encode, CT called prior EMS arrival, ED Physician called prior arrival.

## 2023-09-07 NOTE — ED Provider Notes (Signed)
This patient has been successfully admitted to the hospitalist service, discussed with Dr. Mariea Clonts at 4:45 PM, she has been kind enough to admit this patient to hospital   Eber Hong, MD 09/07/23 [phone]

## 2023-09-07 NOTE — Assessment & Plan Note (Addendum)
Rate 96-103. -Awaiting med reconciliation, she does not know the names of the medication she takes,  - resume Eliquis, carvedilol for now

## 2023-09-07 NOTE — Assessment & Plan Note (Signed)
UDS clean.

## 2023-09-07 NOTE — Progress Notes (Addendum)
Telestroke Note    1308: Code stroke cart activated for EMS pre-alert.   1310: Paged Dr.Bhagat.  1311: Dr.Bhagat on camera.   1312: Patient presents to the ED via EMS after patients caretaker noted patient experiencing sudden onset slurred speech and increased right sided weakness after returning from the bathroom. Caregiver also noted patient twitching. mRS 3. Patient takes Eliquis BID. Patient's last dose of Eliquis unknown. LKW 1230.   1315: EDP with patient. EDP assisting Dr.Bhagat with patient assessment.   1318: Patient arrived in CT.  1320: Dr.Bhagat speaking to patient's family via telephone.   1325: Patient returned from CT.   1327: No further needs from telestroke nurse per Dr.Bhagat. Logged off of telestroke cart per Dr.Bhagat's request at this time.     Derrill Kay Telestroke RN

## 2023-09-07 NOTE — Assessment & Plan Note (Signed)
CKD stage IIIb.  Creatinine 1.89 at baseline.

## 2023-09-07 NOTE — ED Notes (Addendum)
LKW 1230 today

## 2023-09-07 NOTE — H&P (Signed)
History and Physical    Robin [name] DOB: 1965/09/11 DOA: 09/07/2023  PCP: Jarold Motto, PA   Patient coming from: Home  I have personally briefly reviewed patient's old medical records in [facility]  Chief Complaint: Twitching of the right hand  HPI: Robin [name] is a 58 y.o. female with medical history significant for stroke with residual  deficits, systolic and diastolic CHF, atrial fibrillation, COPD, diabetes mellitus, hypertension, CKD 3, aortic stenosis. Patient was brought to the ED via EMS reports of twitching.  At the time of my evaluation, patient's daughter Robin [name] is at bedside, patient is lethargic, able to answer a few questions.  Patient was with her son when per reports, noticed patient was not as responsive as she normally is, noticed twitching to the right side, and worsening of her baseline slurred speech.  Last known normal 12:30 PM She has baseline weakness to the right side, and slurred speech, but family is able to understand her. Patient reports over the past 2 to 3 days she has been feeling unwell.  She reports a mild cough,no difficulty breathing, no nausea no vomiting no abdominal pain, she denies urinary symptoms.  ED Course: Tmax 100.7.  Heart rate 96-103.  Respirate rate 17.  Blood pressures-132/91.  Code stroke called by EMS. Head CT no evidence of acute intracranial abnormality, unchanged large chronic left MCA infarct. Evaluated by telemetry neuro hospitalist Dr. Iver Nestle.  Likely focal seizure, with postictal Todd's phenomenon.  Recommended Keppra 250 x 1 if concern for continued seizures, or if not steadily improving, transferred to [facility] for LTM EEG.  MRI brain without acute abnormality, large chronic left MCA infarct.  UA not suggestive of infection.  Portable chest x-ray with cardiomegaly, vascular congestion.  Review of Systems: As per HPI all other systems reviewed and negative.  Past Medical History:  Diagnosis Date    Allergy    Anemia    Aortic stenosis    a. 02/2023 Echo: mild-mod AS.   Arthritis    Chronic abdominal pain    Chronic HFrEF (heart failure with reduced ejection fraction) (HCC)    a. EF at 30-35% by echo in 08/2018 b. EF at 35% by repeat echo in 05/2019 c. EF improved to 65-70% in 2021 d. EF at 40-45% in 08/2021; e. 02/2023 Echo: 40-45%, glob HK, mod LVH, nl RV size/fx, sev BAE, mild-mod AS.   Cocaine abuse (HCC)    COPD (chronic obstructive pulmonary disease) (HCC)    Diabetes mellitus without complication (HCC)    Essential hypertension, benign    Expressive aphasia    post CVA   Fatty liver    GERD (gastroesophageal reflux disease)    Gout 2016   Normal coronary arteries    a. 10/2008 abnl MV; b. 10/2008 Cath: nl cors.   Ovarian cyst    Permanent atrial fibrillation (HCC)    Stroke (HCC) 12/26/2019   Right sided weakness, and expressive aphasia   Thoracic ascending aortic aneurysm (HCC)    a. 4 cm 10/31/19 CTA; b. 02/2023 Echo: Nl Ao root.   Type 2 diabetes mellitus (HCC)    type II    Past Surgical History:  Procedure Laterality Date   ABDOMINAL HYSTERECTOMY  09/10/2011   Procedure: HYSTERECTOMY ABDOMINAL;  Surgeon: Tilda Burrow, MD;  Location: [facility];  Service: Gynecology;  Laterality: N/A;  Abdominal hysterectomy   CESAREAN SECTION  8119,1478, and 1994   CHOLECYSTECTOMY  1995   IR 3D INDEPENDENT WKST  03/16/2020   IR ANGIO INTRA EXTRACRAN SEL INTERNAL CAROTID BILAT MOD SED  03/16/2020   IR ANGIO VERTEBRAL SEL SUBCLAVIAN INNOMINATE UNI L MOD SED  03/16/2020   IR ANGIO VERTEBRAL SEL VERTEBRAL UNI R MOD SED  03/16/2020   IR CT HEAD LTD  12/27/2019   IR PERCUTANEOUS ART THROMBECTOMY/INFUSION INTRACRANIAL INC DIAG ANGIO  12/27/2019   IR US GUIDE VASC ACCESS RIGHT  12/27/2019   IR US GUIDE VASC ACCESS RIGHT  03/16/2020   RADIOLOGY WITH ANESTHESIA N/A 12/27/2019   Procedure: IR WITH ANESTHESIA;  Surgeon: Julieanne Cotton, MD;  Location: [facility];  Service: Radiology;  Laterality: N/A;    SCAR REVISION  09/10/2011   Procedure: SCAR REVISION;  Surgeon: Tilda Burrow, MD;  Location: [facility];  Service: Gynecology;  Laterality: N/A;  Wide Excision of old Cicatrix   TUBAL LIGATION  1994     reports that she quit smoking about 3 years ago. Her smoking use included cigarettes. She started smoking about 32 years ago. She has never used smokeless tobacco. She reports that she does not currently use alcohol. She reports that she does not currently use drugs.  Allergies  Allergen Reactions   Bee Venom Shortness Of Breath and Swelling    Bodily Swelling   Losartan Other (See Comments)    Nosebleeds per patient report.    Naproxen Other (See Comments)    Acid reflux   Penicillins Nausea Only   Lisinopril     Sinus congestion    Family History  Problem Relation Age of Onset   Cirrhosis Mother    Early death Mother    Alcohol abuse Mother    Diabetes type II Father    Alcohol abuse Father    Diabetes type II Sister    Early death Brother    Alcohol abuse Son    Anesthesia problems Neg Hx    Hypotension Neg Hx    Malignant hyperthermia Neg Hx    Pseudochol deficiency Neg Hx    Colon cancer Neg Hx    Colon polyps Neg Hx    Esophageal cancer Neg Hx    Rectal cancer Neg Hx    Stomach cancer Neg Hx     Prior to Admission medications   Medication Sig Start Date End Date Taking? Authorizing Provider  Accu-Chek Softclix Lancets lancets Use as instructed 06/13/21   Shamleffer, Konrad Dolores, MD  apixaban (ELIQUIS) 5 MG TABS tablet Take 1 tablet by mouth twice daily 09/04/23   Jarold Motto, PA  aspirin EC 81 MG tablet Take 1 tablet (81 mg total) by mouth daily. Swallow whole. 07/19/20   Strader, Lennart Pall, PA-C  Blood Glucose Monitoring Suppl (ACCU-CHEK GUIDE ME) w/Device KIT 1 Device by Does not apply route 3 (three) times daily. 03/09/21   Shamleffer, Konrad Dolores, MD  carvedilol (COREG) 25 MG tablet TAKE 2 TABLETS BY MOUTH TWICE DAILY WITH A MEAL 03/19/23   Antoine Poche, MD  cetaphil (CETAPHIL) lotion Apply 1 Application topically as needed for dry skin. 10/11/22   Sabas Sous, MD  dicyclomine (BENTYL) 20 MG tablet Take 1 tablet (20 mg total) by mouth 3 (three) times daily before meals. 08/18/23   Gilda Crease, MD  Dulaglutide (TRULICITY) 3 MG/0.5ML SOPN Inject 3 mg as directed once a week. 05/09/23   Shamleffer, Konrad Dolores, MD  empagliflozin (JARDIANCE) 25 MG TABS tablet Take 1 tablet (25 mg total) by mouth daily before breakfast. 05/09/23   Shamleffer, Konrad Dolores,  MD  famotidine (PEPCID) 20 MG tablet Take 1 tablet (20 mg total) by mouth 2 (two) times daily. 08/18/23   Gilda Crease, MD  ferrous sulfate (FEROSUL) 325 (65 FE) MG tablet Take 1 tablet by mouth once daily 07/17/23   Jarold Motto, PA  furosemide (LASIX) 20 MG tablet TAKE 1 TABLET BY MOUTH ONCE DAILY (MAY TAKE ADDITIONAL 20 MG IF WEIGHT GAIN IS OVER 2LBS OVERNIGHT OR 5 LBS IN A WEEK) 08/12/22   Antoine Poche, MD  gabapentin (NEURONTIN) 100 MG capsule TAKE 2 CAPSULES BY MOUTH THREE TIMES DAILY 08/14/23   Jarold Motto, PA  glucose blood (ACCU-CHEK GUIDE) test strip USE 1 STRIP TO CHECK GLUCOSE THREE TIMES DAILY 08/28/22   Shamleffer, Konrad Dolores, MD  hydrALAZINE (APRESOLINE) 25 MG tablet TAKE 1 TABLET BY MOUTH THREE TIMES DAILY 06/23/23   Jarold Motto, PA  insulin aspart (NOVOLOG FLEXPEN) 100 UNIT/ML FlexPen Inject 12 Units into the skin 3 (three) times daily with meals. 10/30/22   Shamleffer, Konrad Dolores, MD  insulin glargine (LANTUS SOLOSTAR) 100 UNIT/ML Solostar Pen Inject 44 Units into the skin daily. 10/30/22   Shamleffer, Konrad Dolores, MD  Insulin Pen Needle (BD PEN NEEDLE NANO U/F) 32G X 4 MM MISC Inject 1 Device into the skin in the morning, at noon, in the evening, and at bedtime. 10/30/22   Shamleffer, Konrad Dolores, MD  isosorbide mononitrate (IMDUR) 30 MG 24 hr tablet Take 2 tablets by mouth once daily 06/23/23   Jarold Motto, PA   ondansetron (ZOFRAN-ODT) 4 MG disintegrating tablet 4mg  ODT q4 hours prn nausea/vomit 08/18/23   Pollina, Canary Brim, MD  pantoprazole (PROTONIX) 40 MG tablet Take 1 tablet by mouth once daily 06/23/23   Jarold Motto, PA  timolol (TIMOPTIC) 0.5 % ophthalmic solution Place 1 drop into both eyes 2 (two) times daily. 07/05/22   [provider]  tobramycin (TOBREX) 0.3 % ophthalmic solution 1 drop 4 (four) times daily. 04/16/23   [provider]    Physical Exam: Vitals:   09/07/23 1335 09/07/23 1337 09/07/23 1340 09/07/23 1452  BP: (!) 132/91     Pulse: 96 (!) 103    Resp:  17    Temp:   (!) 100.7 F (38.2 C) 99.4 F (37.4 C)  TempSrc:   Axillary Oral  SpO2: 97% 96%    Weight:   109 kg     Constitutional:  lethargic, calm, comfortable Vitals:   09/07/23 1335 09/07/23 1337 09/07/23 1340 09/07/23 1452  BP: (!) 132/91     Pulse: 96 (!) 103    Resp:  17    Temp:   (!) 100.7 F (38.2 C) 99.4 F (37.4 C)  TempSrc:   Axillary Oral  SpO2: 97% 96%    Weight:   109 kg    Eyes: PERRL, lids and conjunctivae normal ENMT: Mucous membranes are dry .  Neck: normal, supple, no masses, no thyromegaly Respiratory: clear to auscultation bilaterally, no wheezing, no crackles. Normal respiratory effort. No accessory muscle use.  Cardiovascular: Regular rate and rhythm, no murmurs / rubs / gallops. No extremity edema.Extremities warm Abdomen: no tenderness, no masses palpated. No hepatosplenomegaly. Bowel sounds positive.  Musculoskeletal: no clubbing / cyanosis. No joint deformity upper and lower extremities.  Skin: no rashes, lesions, ulcers. No induration Neurologic: Speech now back to baseline per daughter, a bit slurred, chronic weakness to right side- 4/5, poor grip strength on the right. Psychiatric: Lethargic and oriented x 3. Normal mood.   Labs  on Admission: I have personally reviewed following labs and imaging studies  CBC: Recent Labs  Lab 09/07/23 1318   WBC 7.0  NEUTROABS 4.0  HGB 14.1  HCT 44.1  MCV 85.0  PLT 250   Basic Metabolic Panel: Recent Labs  Lab 09/07/23 1318 09/07/23 1343  NA 136  --   K 4.2  --   CL 104  --   CO2 22  --   GLUCOSE 177*  --   BUN 21*  --   CREATININE 1.89*  --   CALCIUM 9.3  --   MG  --  2.0   GFR: Estimated Creatinine Clearance: 41.8 mL/min (A) (by C-G formula based on SCr of 1.89 mg/dL (H)). Liver Function Tests: Recent Labs  Lab 09/07/23 1318  AST 22  ALT 13  ALKPHOS 84  BILITOT 0.7  PROT 7.2  ALBUMIN 3.6   No results for input(s): "LIPASE", "AMYLASE" in the last 168 hours.  Coagulation Profile: Recent Labs  Lab 09/07/23 1318  INR 1.5*   Cardiac Enzymes: Recent Labs  Lab 09/07/23 1343  CKTOTAL 115   CBG: Recent Labs  Lab 09/07/23 1716  GLUCAP 119*   Urine analysis:    Component Value Date/Time   COLORURINE YELLOW 09/07/2023 1500   APPEARANCEUR CLEAR 09/07/2023 1500   LABSPEC 1.015 09/07/2023 1500   PHURINE 5.0 09/07/2023 1500   GLUCOSEU >=500 (A) 09/07/2023 1500   GLUCOSEU >=1000 (A) 09/21/2018 0829   HGBUR NEGATIVE 09/07/2023 1500   BILIRUBINUR NEGATIVE 09/07/2023 1500   BILIRUBINUR Negative 08/14/2022 0957   KETONESUR NEGATIVE 09/07/2023 1500   PROTEINUR NEGATIVE 09/07/2023 1500   UROBILINOGEN 0.2 08/14/2022 0957   UROBILINOGEN 0.2 09/21/2018 0829   NITRITE NEGATIVE 09/07/2023 1500   LEUKOCYTESUR NEGATIVE 09/07/2023 1500    Radiological Exams on Admission: MR BRAIN WO CONTRAST Result Date: 09/07/2023 CLINICAL DATA:  Mental status change, unknown cause. Acute onset of reduced responsiveness with right-sided twitching. History of left MCA stroke. EXAM: MRI HEAD WITHOUT CONTRAST TECHNIQUE: Multiplanar, multiecho pulse sequences of the brain and surrounding structures were obtained without intravenous contrast. COMPARISON:  Head CT 09/07/2023 and MRI 11/13/2020 FINDINGS: Brain: There is no evidence of an acute infarct, mass, midline shift, or extra-axial fluid  collection. A large chronic infarct is again noted anteriorly in the left MCA territory with associated cortical laminar necrosis, chronic blood products, and ex vacuo dilatation of the left lateral ventricle. Wallerian degeneration involves the left cerebral peduncle. No significant chronic small vessel white matter disease is evident. Vascular: Hypoplastic left vertebral artery with absent flow void, either new or more prominent than on the prior MRI. Skull and upper cervical spine: Unremarkable bone marrow signal. Sinuses/Orbits: Unremarkable orbits. Paranasal sinuses and mastoid air cells are clear. Other: None. IMPRESSION: 1. No acute intracranial abnormality. 2. Large chronic left MCA infarct. 3. Hypoplastic distal left vertebral artery with absent flow void suggesting slow flow or occlusion. Electronically Signed   By: Sebastian Ache M.D.   On: 09/07/2023 16:46   DG Chest Portable 1 View Result Date: 09/07/2023 CLINICAL DATA:  Cough EXAM: PORTABLE CHEST 1 VIEW COMPARISON:  08/17/2023 FINDINGS: Cardiomegaly, vascular congestion. No confluent opacities, effusions or edema. No acute bony abnormality. IMPRESSION: Cardiomegaly, vascular congestion. Electronically Signed   By: Charlett Nose M.D.   On: 09/07/2023 15:41   CT HEAD CODE STROKE WO CONTRAST Result Date: 09/07/2023 CLINICAL DATA:  Code stroke. Aphasia and right-sided weakness. Seizure activity. History of stroke. EXAM: CT HEAD WITHOUT CONTRAST TECHNIQUE: Contiguous  axial images were obtained from the base of the skull through the vertex without intravenous contrast. RADIATION DOSE REDUCTION: This exam was performed according to the departmental dose-optimization program which includes automated exposure control, adjustment of the mA and/or kV according to patient size and/or use of iterative reconstruction technique. COMPARISON:  Head CT 11/26/2021 FINDINGS: Brain: There is no evidence of an acute infarct, intracranial hemorrhage, mass, midline shift, or  extra-axial fluid collection. There is an unchanged large chronic infarct anteriorly in the left MCA territory with associated ex vacuo dilatation of the left lateral and third ventricles. The right lateral and fourth ventricles are normal in size. Vascular: No suspicious acute vascular hyperdensity. Skull: No fracture or suspicious osseous lesion. Sinuses/Orbits: Paranasal sinuses and mastoid air cells are clear. Unremarkable orbits. Other: None. ASPECTS (Alberta Stroke Program Early CT Score) - Ganglionic level infarction (caudate, lentiform nuclei, internal capsule, insula, M1-M3 cortex): 7 - Supraganglionic infarction (M4-M6 cortex): 3 Total score (0-10 with 10 being normal): 10 These results were called by telephone at the time of interpretation on 09/07/2023 at 1:33 pm to Dr. Beckey Downing, who verbally acknowledged these results. IMPRESSION: 1. No evidence of acute intracranial abnormality. 2. Unchanged large chronic left MCA infarct. Electronically Signed   By: Sebastian Ache M.D.   On: 09/07/2023 13:33   EKG: Independently reviewed.  Atrial fibrillation, rate 97, QTc 465.  Significant ST or T wave changes from prior.  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  Evaluated by teleneurologist Dr. Iver Nestle, via video evaluation observed focal right hand twitching concerning for focal seizure.  No history of seizures.  CT head- unchanged large chronic left MCA infarct. ??  Seizure provoked by systemic illness, in patient with  low seizure threshold. -Following teleneurology recommendation, Keppra 250 IV x 1, continue to 50 twice daily -MRI brain no acute intracranial process, again demonstrates large chronic left MCA infarct -EEG -If patient not improving, or concern for continued seizure activity, transferred to [facility] for LTM EEG. -Seizure precautions - 2 mg Ativan IV as needed up to 2 doses ordered, please notify neurology if this is used; for seizure activity lasting greater than 5 minutes only. Addendum- Dr. Iver Nestle reached out to me-  no beds presently available at [facility], but if patient has continuous focal seizures, would recommend transferring to [facility], dose of Keppra can be increased to 750 -considering renal function, but to call and talk to neurology on call first , before change in medication dose.  SIRS (systemic inflammatory response syndrome) (HCC) With fever of 100.7, tachycardia heart rate 93-103.  Also reports cough, congestion and malaise over the past couple of days.  Chest x-ray-cardiomegaly, vascular congestion.  UA- clean, not suggestive of UTI.  Abdomen appears benign.  COVID/influenza RSV -negative. ??  Viral etiology. -Check procalcitonin- < 0.1, hold off on antibiotics for now -Obtain blood cultures - N/s 75cc/hr x 12hrs   COPD (chronic obstructive pulmonary disease) (HCC) Stable -Resume home regimen  Stage 3b chronic kidney disease (CKD) (HCC) CKD stage  IIIb.  Creatinine 1.89 at baseline.  Diabetes mellitus (HCC) - Hgba1c - SSI- S -Resume home Lantus at reduced dose 15 units daily (44 units home dose)  Atrial fibrillation, chronic (HCC) Rate 96-103. -Awaiting med reconciliation, she does not know the names of the medication she takes,  - resume Eliquis, carvedilol for now  Chronic diastolic congestive heart failure (HCC) Stable and Compensated.  Last echo 02/2023 EF of 40 to 45%. -Hold Lasix 20 mg daily, she appears dehydrated.  Essential  hypertension Stable. 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  Lives at home,. -Resume Eliquis  Cocaine abuse (HCC) UDS clean.    DVT prophylaxis: Eliquis Code Status: FULL code Family Communication: None at bedside Disposition Plan: ~ 2 days Consults called: neurology Admission status: Inpt tele  I certify that at the point of admission it is my clinical judgment that the patient will require inpatient hospital care spanning beyond 2 midnights from the point of admission due to high intensity of service, high risk for further deterioration and high frequency of surveillance required.   Author: Onnie Boer, MD 09/07/2023 9:52 PM  For on call review [email or web address].

## 2023-09-08 ENCOUNTER — Inpatient Hospital Stay (HOSPITAL_COMMUNITY)
Admit: 2023-09-08 | Discharge: 2023-09-08 | Disposition: A | Discharge status: Home / Self Care | Payer: 59 | Attending: Internal Medicine

## 2023-09-08 DIAGNOSIS — R569 Unspecified convulsions: Secondary | ICD-10-CM | POA: Diagnosis not present

## 2023-09-08 LAB — BASIC METABOLIC PANEL
Anion gap: 10 (ref 5–15)
BUN: 21 mg/dL — ABNORMAL HIGH (ref 6–20)
CO2: 22 mmol/L (ref 22–32)
Calcium: 9 mg/dL (ref 8.9–10.3)
Chloride: 106 mmol/L (ref 98–111)
Creatinine, Ser: 1.83 mg/dL — ABNORMAL HIGH (ref 0.44–1.00)
GFR, Estimated: 32 mL/min — ABNORMAL LOW (ref >60)
Glucose, Bld: 129 mg/dL — ABNORMAL HIGH (ref 70–99)
Potassium: 3.9 mmol/L (ref 3.5–5.1)
Sodium: 138 mmol/L (ref 135–145)

## 2023-09-08 LAB — GLUCOSE, CAPILLARY
Glucose-Capillary: 188 mg/dL — ABNORMAL HIGH (ref 70–99)
Glucose-Capillary: 130 mg/dL — ABNORMAL HIGH (ref 70–99)
Glucose-Capillary: 154 mg/dL — ABNORMAL HIGH (ref 70–99)
Glucose-Capillary: 166 mg/dL — ABNORMAL HIGH (ref 70–99)
Glucose-Capillary: 177 mg/dL — ABNORMAL HIGH (ref 70–99)

## 2023-09-08 LAB — CBC
HCT: 46 % (ref 36.0–46.0)
Hemoglobin: 14.2 g/dL (ref 12.0–15.0)
MCH: 26.5 pg (ref 26.0–34.0)
MCHC: 30.9 g/dL (ref 30.0–36.0)
MCV: 85.8 fL (ref 80.0–100.0)
Platelets: 228 10*3/uL (ref 150–400)
RBC: 5.36 MIL/uL — ABNORMAL HIGH (ref 3.87–5.11)
RDW: 15.2 % (ref 11.5–15.5)
WBC: 6.7 10*3/uL (ref 4.0–10.5)
nRBC: 0 % (ref 0.0–0.2)

## 2023-09-08 LAB — HIV ANTIBODY (ROUTINE TESTING W REFLEX): HIV Screen 4th Generation wRfx: NONREACTIVE

## 2023-09-08 MED ORDER — GABAPENTIN 100 MG PO CAPS
200.0000 mg | ORAL_CAPSULE | Freq: Three times a day (TID) | ORAL | Status: DC
Start: 2023-09-08 — End: 2023-09-10
  Administered 2023-09-08 – 2023-09-10 (×7): 200 mg via ORAL
  Filled 2023-09-08 (×7): qty 2

## 2023-09-08 NOTE — TOC Initial Note (Signed)
Transition of Care [facility]) - Initial/Assessment Note    Patient Details  Name: Robin [name] MRN: [id number] Date of Birth: 12-03-1965  Transition of Care [facility]) CM/SW Contact:    Karn Cassis, LCSW Phone Number: 09/08/2023, 9:30 AM  Clinical Narrative:  Pt admitted for focal seizures. Assessment completed due to high risk readmission score. Per sister, pt lives alone. She has a CAP aid M-F from 9-1 and 4-8 and on weekends from 9-1. Pt ambulates with a cane. She is currently receiving speech therapy at [facility]. Plan is for pt to return home when medically stable. TOC will continue to follow.                  Expected Discharge Plan: Home/Self Care Barriers to Discharge: Continued Medical Work up   Patient Goals and CMS Choice Patient states their goals for this hospitalization and ongoing recovery are:: return home   Choice offered to / list presented to : Sibling [place] ownership interest in [facility].provided to::  (n/a)    Expected Discharge Plan and Services In-house Referral: Clinical Social Work     Living arrangements for the past 2 months: Apartment                                      Prior Living Arrangements/Services Living arrangements for the past 2 months: Apartment Lives with:: Self Patient language and need for interpreter reviewed:: Yes Do you feel safe going back to the place where you live?: Yes      Need for Family Participation in Patient Care: Yes (Comment) Care giver support system in place?: Yes (comment) Current home services: DME (cane) Criminal Activity/Legal Involvement Pertinent to Current Situation/Hospitalization: No - Comment as needed  Activities of Daily Living   ADL Screening (condition at time of admission) Independently performs ADLs?: No Does the patient have a NEW difficulty with bathing/dressing/toileting/self-feeding that is expected to last >3 days?: Yes (Initiates electronic  notice to provider for possible OT consult) Does the patient have a NEW difficulty with getting in/out of bed, walking, or climbing stairs that is expected to last >3 days?: Yes (Initiates electronic notice to provider for possible PT consult) Does the patient have a NEW difficulty with communication that is expected to last >3 days?: Yes (Initiates electronic notice to provider for possible SLP consult) Is the patient deaf or have difficulty hearing?: No Does the patient have difficulty seeing, even when wearing glasses/contacts?: No Does the patient have difficulty concentrating, remembering, or making decisions?: No  Permission Sought/Granted                  Emotional Assessment         Alcohol / Substance Use: Not Applicable Psych Involvement: No (comment)  Admission diagnosis:  Slurred speech [R47.81] Focal seizures (HCC) [R56.9] Febrile illness [R50.9] Seizure-like activity (HCC) [R56.9] Patient Active Problem List   Diagnosis Date Noted   Focal seizures (HCC) 09/07/2023   SIRS (systemic inflammatory response syndrome) (HCC) 09/07/2023   Acute on chronic systolic CHF (congestive heart failure) (HCC) 11/27/2021   Stage 3b chronic kidney disease (CKD) (HCC) 11/27/2021   Microcytic anemia 11/27/2021   Lactic acidosis 11/27/2021   Diabetes mellitus (HCC) 03/09/2021   Type 2 diabetes mellitus with stage 3a chronic kidney disease, with long-term current use of insulin (HCC) 03/09/2021   Acute metabolic encephalopathy 12/31/2020  Hyperglycemia 12/31/2020   Prolonged QT interval 05/12/2020   History of CVA with residual deficit 05/12/2020   Atrial fibrillation, chronic (HCC) 05/12/2020   Right hemiparesis (HCC)    Essential hypertension    Chronic diastolic congestive heart failure (HCC)    Dysphagia due to recent stroke 01/05/2020   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 01/05/2020   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 [place] [address] [name] [address] Phone: [phone] Fax: [phone]  [facility] [place] (New Address) - [place], [place] - [address] AT Previously: [place], [place] [address] 4th Floor [address] [place] [address] Phone: [phone] Fax: [phone]  [place] - [facility] [address] [name] [address] Phone: [phone] Fax: [phone]     Social Drivers of Health (SDOH) Social History: SDOH Screenings   Food Insecurity: No Food Insecurity (09/07/2023)  Housing: Low Risk  (09/07/2023)  Transportation Needs: No Transportation Needs (09/07/2023)  Utilities: Not At Risk (09/07/2023)  Depression (PHQ2-9): Low Risk  (02/05/2023)  Tobacco Use: Medium Risk (09/04/2023)   SDOH Interventions:     Readmission Risk Interventions    09/08/2023    9:27 AM  Readmission Risk Prevention Plan  Transportation Screening Complete  HRI or Home Care Consult Complete  Social Work Consult for Recovery Care Planning/Counseling Complete  Palliative Care Screening Not Applicable  Medication Review [employer]) Complete

## 2023-09-08 NOTE — Progress Notes (Signed)
PROGRESS NOTE    Robin [name]  ZOX:[id number] DOB: 22-Feb-1966 DOA: 09/07/2023 PCP: Jarold Motto, PA   Brief Narrative:    Robin [name] is a 58 y.o. female with medical history significant for stroke with residual  deficits, systolic and diastolic CHF, atrial fibrillation, COPD, diabetes mellitus, hypertension, CKD 3, aortic stenosis. Patient was brought to the ED via EMS reports of twitching.  She was admitted for evaluation of focal seizures.  Neurology plans to evaluate and repeat EEG.  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  Evaluated by teleneurologist Dr. Iver Nestle, via video evaluation observed focal right hand twitching concerning for focal seizure.  No history of seizures.  CT head- unchanged large chronic left MCA infarct. ??  Seizure provoked by systemic illness, in patient with low seizure threshold. -Following teleneurology recommendation, Keppra 250 IV x 1, continue 250 mg twice daily -MRI brain no acute intracranial process, again demonstrates large chronic left MCA infarct -EEG to be reordered by neurology with further evaluation pending. -If patient not improving, or concern for continued seizure activity, will need transfer to [facility] for LTM EEG. -Seizure precautions - 2 mg Ativan IV as needed up to 2 doses ordered, please notify neurology if this is used; for seizure activity lasting greater than 5 minutes only. -Resume home gabapentin   SIRS (systemic inflammatory  response syndrome) (HCC) With fever of 100.7, tachycardia heart rate 93-103.  Also reports cough, congestion and malaise over the past couple of days.  Chest x-ray-cardiomegaly, vascular congestion.  UA- clean, not suggestive of UTI.  Abdomen appears benign.  COVID/influenza RSV -negative. ??  Viral etiology. -Checked procalcitonin- < 0.1, hold off on antibiotics for now -Blood cultures pending, monitor closely as temperatures are borderline elevated. - N/s 75cc/hr x 12hrs     COPD (chronic obstructive pulmonary disease) (HCC) Stable -Resume home regimen   Stage 3b chronic kidney disease (CKD) (HCC) CKD stage IIIb.  Creatinine 1.89 at baseline.   Diabetes mellitus (HCC) - Hgba1c - SSI- S -Resume home Lantus at reduced dose 15 units daily (44 units home dose)   Atrial fibrillation, chronic (HCC) Rate 96-103. -Awaiting med reconciliation, she does not know the names of the medication she takes,  - resume Eliquis, carvedilol for now   Chronic diastolic congestive heart failure (HCC) Stable and Compensated.  Last echo 02/2023 EF of 40 to 45%. -Hold Lasix 20 mg daily, she appears dehydrated.   Essential hypertension Stable. 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  Lives at home,. -Resumed Eliquis   Cocaine abuse (HCC) UDS clean.  Obesity, class II -BMI 37.64    DVT prophylaxis:apixaban Code Status: Full Family Communication: Multiple family at bedside 2/3 Disposition Plan:  Status is: Inpatient Remains inpatient appropriate because: Need for IV medications and close monitoring.   Consultants:  Neurology  Procedures:  None  Antimicrobials:  None   Subjective: Patient seen and evaluated with some ongoing somnolence and multiple family members at bedside.  She is responsive  to questioning and complains of some mild chest pain in as well as some nausea.  Objective: Vitals:   09/07/23 1840 09/07/23 2037 09/08/23 0100 09/08/23 0751  BP:  (!) 151/99 (!) 140/86 114/74  Pulse:  (!) 103  98  Resp:    16  Temp:  100 F (37.8 C)    TempSrc:  Oral    SpO2:  99%  95%  Weight:      Height: 5\' 7"  (1.702 m)       Intake/Output Summary (Last 24 hours) at 09/08/2023 0949 Last data filed at 09/08/2023 0513 Gross per 24 hour  Intake 415.92 ml  Output --  Net 415.92 ml   Filed Weights   09/07/23 1340  Weight: 109 kg    Examination:  General exam: Appears calm and comfortable  Respiratory system: Clear to auscultation. Respiratory effort normal. Cardiovascular system: S1 & S2 heard, RRR.  Gastrointestinal system: Abdomen is soft Central nervous system: Somnolent but arousable Extremities: No edema Skin: No significant lesions noted Psychiatry: Flat affect.    Data Reviewed: I have personally reviewed following labs and imaging studies  CBC: Recent Labs  Lab 09/07/23 1318 09/08/23 0442  WBC 7.0 6.7  NEUTROABS 4.0  --   HGB 14.1 14.2  HCT 44.1 46.0  MCV 85.0 85.8  PLT 250 228   Basic Metabolic Panel: Recent Labs  Lab 09/07/23 1318 09/07/23 1343 09/08/23 0442  NA 136  --  138  K 4.2  --  3.9  CL 104  --  106  CO2 22  --  22  GLUCOSE 177*  --  129*  BUN 21*  --  21*  CREATININE 1.89*  --  1.83*  CALCIUM 9.3  --  9.0  MG  --  2.0  --    GFR: Estimated Creatinine Clearance: 43.2 mL/min (A) (by C-G formula based on SCr of 1.83 mg/dL (H)). Liver Function Tests: Recent Labs  Lab 09/07/23 1318  AST 22  ALT 13  ALKPHOS 84  BILITOT 0.7  PROT 7.2  ALBUMIN 3.6   No results for input(s): "LIPASE", "AMYLASE" in the last 168 hours. No results for input(s): "AMMONIA" in the last 168 hours. Coagulation Profile: Recent Labs  Lab 09/07/23 1318  INR 1.5*   Cardiac Enzymes: Recent Labs  Lab 09/07/23 1343  CKTOTAL 115   BNP (last 3  results) No results for input(s): "PROBNP" in the last 8760 hours. HbA1C: No results for input(s): "HGBA1C" in the last 72 hours. CBG: Recent Labs  Lab 09/07/23 1312 09/07/23 1716 09/07/23 2035 09/08/23 0805  GLUCAP 188* 119* 201* 130*   Lipid Profile: No results for input(s): "CHOL", "HDL", "LDLCALC", "TRIG", "CHOLHDL", "LDLDIRECT" in the last 72 hours. Thyroid Function Tests: No results for input(s): "TSH", "T4TOTAL", "FREET4", "T3FREE", "THYROIDAB" in the last 72 hours. Anemia Panel: No results for input(s): "VITAMINB12", "FOLATE", "FERRITIN", "TIBC", "IRON", "RETICCTPCT" in the last 72 hours. Sepsis Labs: Recent Labs  Lab 09/07/23 1318 09/07/23 1908  PROCALCITON <0.10  --   LATICACIDVEN  --  0.8    Recent Results (from the past 240 hours)  Resp panel by RT-PCR (RSV, Flu A&B, Covid) Anterior Nasal Swab     Status: None   Collection Time: 09/07/23  3:00 PM   Specimen: Anterior Nasal Swab  Result Value Ref Range Status   SARS  Coronavirus 2 by RT PCR NEGATIVE NEGATIVE Final    Comment: (NOTE) SARS-CoV-2 target nucleic acids are NOT DETECTED.  The SARS-CoV-2 RNA is generally detectable in upper respiratory specimens during the acute phase of infection. The lowest concentration of SARS-CoV-2 viral copies this assay can detect is 138 copies/mL. A negative result does not preclude SARS-Cov-2 infection and should not be used as the sole basis for treatment or other patient management decisions. A negative result may occur with  improper specimen collection/handling, submission of specimen other than nasopharyngeal swab, presence of viral mutation(s) within the areas targeted by this assay, and inadequate number of viral copies(<138 copies/mL). A negative result must be combined with clinical observations, patient history, and epidemiological information. The expected result is Negative.  Fact Sheet for Patients:  BloggerCourse.com  Fact Sheet  for Healthcare Providers:  SeriousBroker.it  This test is no t yet approved or cleared by the [place] FDA and  has been authorized for detection and/or diagnosis of SARS-CoV-2 by FDA under an Emergency Use Authorization (EUA). This EUA will remain  in effect (meaning this test can be used) for the duration of the COVID-19 declaration under Section 564(b)(1) of the Act, 21 U.S.C.section 360bbb-3(b)(1), unless the authorization is terminated  or revoked sooner.       Influenza A by PCR NEGATIVE NEGATIVE Final   Influenza B by PCR NEGATIVE NEGATIVE Final    Comment: (NOTE) The Xpert Xpress SARS-CoV-2/FLU/RSV plus assay is intended as an aid in the diagnosis of influenza from Nasopharyngeal swab specimens and should not be used as a sole basis for treatment. Nasal washings and aspirates are unacceptable for Xpert Xpress SARS-CoV-2/FLU/RSV testing.  Fact Sheet for Patients: BloggerCourse.com  Fact Sheet for Healthcare Providers: SeriousBroker.it  This test is not yet approved or cleared by the [place] FDA and has been authorized for detection and/or diagnosis of SARS-CoV-2 by FDA under an Emergency Use Authorization (EUA). This EUA will remain in effect (meaning this test can be used) for the duration of the COVID-19 declaration under Section 564(b)(1) of the Act, 21 U.S.C. section 360bbb-3(b)(1), unless the authorization is terminated or revoked.     Resp Syncytial Virus by PCR NEGATIVE NEGATIVE Final    Comment: (NOTE) Fact Sheet for Patients: BloggerCourse.com  Fact Sheet for Healthcare Providers: SeriousBroker.it  This test is not yet approved or cleared by the [place] FDA and has been authorized for detection and/or diagnosis of SARS-CoV-2 by FDA under an Emergency Use Authorization (EUA). This EUA will remain in effect (meaning  this test can be used) for the duration of the COVID-19 declaration under Section 564(b)(1) of the Act, 21 U.S.C. section 360bbb-3(b)(1), unless the authorization is terminated or revoked.  Performed at [facility], [address] [place], [name] [address]   Culture, blood (Routine X 2) w Reflex to ID Panel     Status: None (Preliminary result)   Collection Time: 09/07/23  6:38 PM   Specimen: BLOOD LEFT FOREARM  Result Value Ref Range Status   Specimen Description BLOOD LEFT FOREARM BLOOD  Final   Special Requests   Final    BOTTLES DRAWN AEROBIC AND ANAEROBIC Blood Culture results may not be optimal due to an inadequate volume of blood received in culture bottles Performed at [facility], [address], [place], [name] [address]    Culture PENDING  Incomplete   Report Status PENDING  Incomplete  Culture, blood (Routine X 2) w Reflex to ID Panel  Status: None (Preliminary result)   Collection Time: 09/07/23  6:48 PM   Specimen: BLOOD LEFT HAND  Result Value Ref Range Status   Specimen Description BLOOD LEFT HAND BLOOD  Final   Special Requests   Final    BOTTLES DRAWN AEROBIC AND ANAEROBIC Blood Culture results may not be optimal due to an inadequate volume of blood received in culture bottles Performed at [facility], [address] [place], [name] [address]    Culture PENDING  Incomplete   Report Status PENDING  Incomplete         Radiology Studies: MR BRAIN WO CONTRAST Result Date: 09/07/2023 CLINICAL DATA:  Mental status change, unknown cause. Acute onset of reduced responsiveness with right-sided twitching. History of left MCA stroke. EXAM: MRI HEAD WITHOUT CONTRAST TECHNIQUE: Multiplanar, multiecho pulse sequences of the brain and surrounding structures were obtained without intravenous contrast. COMPARISON:  Head CT 09/07/2023 and MRI 11/13/2020 FINDINGS: Brain: There is no evidence of an acute infarct, mass, midline shift, or extra-axial fluid collection. A large  chronic infarct is again noted anteriorly in the left MCA territory with associated cortical laminar necrosis, chronic blood products, and ex vacuo dilatation of the left lateral ventricle. Wallerian degeneration involves the left cerebral peduncle. No significant chronic small vessel white matter disease is evident. Vascular: Hypoplastic left vertebral artery with absent flow void, either new or more prominent than on the prior MRI. Skull and upper cervical spine: Unremarkable bone marrow signal. Sinuses/Orbits: Unremarkable orbits. Paranasal sinuses and mastoid air cells are clear. Other: None. IMPRESSION: 1. No acute intracranial abnormality. 2. Large chronic left MCA infarct. 3. Hypoplastic distal left vertebral artery with absent flow void suggesting slow flow or occlusion. Electronically Signed   By: Sebastian Ache M.D.   On: 09/07/2023 16:46   DG Chest Portable 1 View Result Date: 09/07/2023 CLINICAL DATA:  Cough EXAM: PORTABLE CHEST 1 VIEW COMPARISON:  08/17/2023 FINDINGS: Cardiomegaly, vascular congestion. No confluent opacities, effusions or edema. No acute bony abnormality. IMPRESSION: Cardiomegaly, vascular congestion. Electronically Signed   By: Charlett Nose M.D.   On: 09/07/2023 15:41   CT HEAD CODE STROKE WO CONTRAST Result Date: 09/07/2023 CLINICAL DATA:  Code stroke. Aphasia and right-sided weakness. Seizure activity. History of stroke. EXAM: CT HEAD WITHOUT CONTRAST TECHNIQUE: Contiguous axial images were obtained from the base of the skull through the vertex without intravenous contrast. RADIATION DOSE REDUCTION: This exam was performed according to the departmental dose-optimization program which includes automated exposure control, adjustment of the mA and/or kV according to patient size and/or use of iterative reconstruction technique. COMPARISON:  Head CT 11/26/2021 FINDINGS: Brain: There is no evidence of an acute infarct, intracranial hemorrhage, mass, midline shift, or extra-axial fluid  collection. There is an unchanged large chronic infarct anteriorly in the left MCA territory with associated ex vacuo dilatation of the left lateral and third ventricles. The right lateral and fourth ventricles are normal in size. Vascular: No suspicious acute vascular hyperdensity. Skull: No fracture or suspicious osseous lesion. Sinuses/Orbits: Paranasal sinuses and mastoid air cells are clear. Unremarkable orbits. Other: None. ASPECTS ([place]berta Stroke Program Early CT Score) - Ganglionic level infarction (caudate, lentiform nuclei, internal capsule, insula, M1-M3 cortex): 7 - Supraganglionic infarction (M4-M6 cortex): 3 Total score (0-10 with 10 being normal): 10 These results were called by telephone at the time of interpretation on 09/07/2023 at 1:33 pm to Dr. Beckey Downing, who verbally acknowledged these results. IMPRESSION: 1. No evidence of acute intracranial abnormality. 2. Unchanged large chronic left  MCA infarct. Electronically Signed   By: Sebastian Ache M.D.   On: 09/07/2023 13:33        Scheduled Meds:  apixaban  5 mg Oral BID   carvedilol  25 mg Oral BID WC   famotidine  20 mg Oral BID   gabapentin  200 mg Oral TID   insulin aspart  0-5 Units Subcutaneous QHS   insulin aspart  0-9 Units Subcutaneous TID WC   insulin glargine-yfgn  15 Units Subcutaneous Daily   levETIRAcetam  250 mg Oral BID   Continuous Infusions:  sodium chloride 75 mL/hr at 09/08/23 0513     LOS: 1 day    Time spent: 55 minutes    Jahmil Macleod Hoover Brunette, DO [facility]  If 7PM-7AM, please contact night-coverage [email or web address] 09/08/2023, 9:49 AM

## 2023-09-08 NOTE — Progress Notes (Signed)
 EEG complete - results pending

## 2023-09-08 NOTE — Evaluation (Signed)
Speech Language Pathology Evaluation Patient Details Name: Robin [name] MRN: [id number] DOB: 1966/01/21 Today's Date: 09/08/2023 Time: 7829-5621 SLP Time Calculation (min) (ACUTE ONLY): 26 min  Problem List:  Patient Active Problem List   Diagnosis Date Noted   Focal seizures (HCC) 09/07/2023   SIRS (systemic inflammatory response syndrome) (HCC) 09/07/2023   Acute on chronic systolic CHF (congestive heart failure) (HCC) 11/27/2021   Stage 3b chronic kidney disease (CKD) (HCC) 11/27/2021   Microcytic anemia 11/27/2021   Lactic acidosis 11/27/2021   Diabetes mellitus (HCC) 03/09/2021   Type 2 diabetes mellitus with stage 3a chronic kidney disease, with long-term current use of insulin (HCC) 03/09/2021   Acute metabolic encephalopathy 12/31/2020   Hyperglycemia 12/31/2020   Prolonged QT interval 05/12/2020   History of CVA with residual deficit 05/12/2020   Atrial fibrillation, chronic (HCC) 05/12/2020   Right hemiparesis (HCC)    Essential hypertension    Chronic diastolic congestive heart failure (HCC)    Dysphagia due to recent stroke 01/05/2020   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 01/05/2020   Embolic cerebral infarction [facility]) s/p clot retrieval 12/27/2019   Thoracic aortic aneurysm without rupture (HCC)    Atrial fibrillation with RVR (HCC) 05/10/2019   Type 2 diabetes mellitus with diabetic autonomic neuropathy, with long-term current use of insulin (HCC) 09/01/2018   Cocaine abuse (HCC) 08/25/2018   Cigarette nicotine dependence, uncomplicated 06/07/2015   GERD (gastroesophageal reflux disease) 06/07/2011   COPD (chronic obstructive pulmonary disease) (HCC) 06/07/2011   Arthritis 06/07/2011   Past Medical History:  Past Medical History:  Diagnosis Date   Allergy    Anemia    Aortic stenosis    a. 02/2023 Echo: mild-mod AS.   Arthritis    Chronic abdominal pain    Chronic HFrEF (heart failure with reduced ejection fraction) (HCC)    a. EF at 30-35%  by echo in 08/2018 b. EF at 35% by repeat echo in 05/2019 c. EF improved to 65-70% in 2021 d. EF at 40-45% in 08/2021; e. 02/2023 Echo: 40-45%, glob HK, mod LVH, nl RV size/fx, sev BAE, mild-mod AS.   Cocaine abuse (HCC)    COPD (chronic obstructive pulmonary disease) (HCC)    Diabetes mellitus without complication (HCC)    Essential hypertension, benign    Expressive aphasia    post CVA   Fatty liver    GERD (gastroesophageal reflux disease)    Gout 2016   Normal coronary arteries    a. 10/2008 abnl MV; b. 10/2008 Cath: nl cors.   Ovarian cyst    Permanent atrial fibrillation (HCC)    Stroke (HCC) 12/26/2019   Right sided weakness, and expressive aphasia   Thoracic ascending aortic aneurysm (HCC)    a. 4 cm 10/31/19 CTA; b. 02/2023 Echo: Nl Ao root.   Type 2 diabetes mellitus (HCC)    type II   Past Surgical History:  Past Surgical History:  Procedure Laterality Date   ABDOMINAL HYSTERECTOMY  09/10/2011   Procedure: HYSTERECTOMY ABDOMINAL;  Surgeon: Tilda Burrow, MD;  Location: [facility];  Service: Gynecology;  Laterality: N/A;  Abdominal hysterectomy   CESAREAN SECTION  3086,5784, and 1994   CHOLECYSTECTOMY  1995   IR 3D INDEPENDENT WKST  03/16/2020   IR ANGIO INTRA EXTRACRAN SEL INTERNAL CAROTID BILAT MOD SED  03/16/2020   IR ANGIO VERTEBRAL SEL SUBCLAVIAN INNOMINATE UNI L MOD SED  03/16/2020   IR ANGIO VERTEBRAL SEL VERTEBRAL UNI R MOD SED  03/16/2020   IR  CT HEAD LTD  12/27/2019   IR PERCUTANEOUS ART THROMBECTOMY/INFUSION INTRACRANIAL INC DIAG ANGIO  12/27/2019   IR US GUIDE VASC ACCESS RIGHT  12/27/2019   IR US GUIDE VASC ACCESS RIGHT  03/16/2020   RADIOLOGY WITH ANESTHESIA N/A 12/27/2019   Procedure: IR WITH ANESTHESIA;  Surgeon: Julieanne Cotton, MD;  Location: [facility];  Service: Radiology;  Laterality: N/A;   SCAR REVISION  09/10/2011   Procedure: SCAR REVISION;  Surgeon: Tilda Burrow, MD;  Location: [facility];  Service: Gynecology;  Laterality: N/A;  Wide Excision of old Cicatrix    TUBAL LIGATION  1994   HPI:  Robin [name] is a 58 y.o. female with medical history significant for stroke with residual  deficits, systolic and diastolic CHF, atrial fibrillation, COPD, diabetes mellitus, hypertension, CKD 3, aortic stenosis.  Patient was brought to the ED via EMS reports of twitching.  At the time of my evaluation, patient's daughter Robin [name] is at bedside, patient is lethargic, able to answer a few questions.  Patient was with her son when per reports, noticed patient was not as responsive as she normally is, noticed twitching to the right side, and worsening of her baseline slurred speech.  Last known normal 12:30 PM  She has baseline weakness to the right side, and slurred speech, but family is able to understand her.  Patient reports over the past 2 to 3 days she has been feeling unwell.  She reports a mild cough,no difficulty breathing, no nausea no vomiting no abdominal pain, she denies urinary symptoms.   Assessment / Plan / Recommendation Clinical Impression  Pt known to SLP from outpatient SLP therapy addressing aphasia and apraxia. Pt now admitted to hospital for suspected seizure. Her boyfriend and daughter are in room. Her daughter reports that she thinks there are no changes in speech/language with this admission. Pt has aphasia and apraxia from previous stroke and she currently uses total communication strategies to communicate, including the Lingraphica. She is often able to write single words to answer questions and find icons on her Lingraphica to answer personally relevant questions and engage in conversation. Pt with relative preservation of receptive skills with occasional need for repetition and stressing of words. She was noted to perseverate more today during picture naming task (potato), which is not her norm. She also reports feeling tired and her eyes look a little glassy. Pt appears to be at her baseline for cognitive communication skills with some negative  influence of fatigue. She was encouraged to use her Lingraphica device to communicate with hospital caregivers. Pt has one more scheduled outpatient SLP session and her sister was planning to accompany her to complete training. SLP will sign off during acute stay, but please have Pt return for outpatient SLP services.    SLP Assessment  SLP Recommendation/Assessment: All further Speech Lanaguage Pathology  needs can be addressed in the next venue of care SLP Visit Diagnosis: Aphasia (R47.01);Apraxia (R48.2)    Recommendations for follow up therapy are one component of a multi-disciplinary discharge planning process, led by the attending physician.  Recommendations may be updated based on patient status, additional functional criteria and insurance authorization.    Follow Up Recommendations  Outpatient SLP    Assistance Recommended at Discharge  PRN  Functional Status Assessment Patient has had a recent decline in their functional status and demonstrates the ability to make significant improvements in function in a reasonable and predictable amount of time.  Frequency and Duration  SLP Evaluation Cognition  Overall Cognitive Status: Within Functional Limits for tasks assessed (Pt with aphasia which impacts verbal problem solving etc) Arousal/Alertness: Awake/alert Orientation Level: Oriented X4 Year: 2025 Month: February Day of Week: Correct Memory: Appears intact Awareness: Appears intact Problem Solving: Appears intact Safety/Judgment: Appears intact       Comprehension  Auditory Comprehension Overall Auditory Comprehension: Impaired Yes/No Questions: Within Functional Limits Commands: Impaired Multistep Basic Commands: 75-100% accurate Conversation: Simple Interfering Components: Processing speed EffectiveTechniques: Extra processing time;Stressing words;Visual/Gestural cues;Repetition Visual Recognition/Discrimination Discrimination: Within Function  Limits Reading Comprehension Reading Status:  (WFL for single words)    Expression Expression Primary Mode of Expression: Verbal (now has a Lingraphica to augment communication) Verbal Expression Overall Verbal Expression: Impaired at baseline Initiation: No impairment Automatic Speech: Name;Social Response Level of Generative/Spontaneous Verbalization: Phrase Repetition: Impaired Level of Impairment: Word level Naming: Impairment Responsive: 51-75% accurate Confrontation: Impaired Convergent: 50-74% accurate Divergent: Not tested Verbal Errors: Semantic paraphasias;Phonemic paraphasias;Perseveration;Aware of errors;Not aware of errors Pragmatics: No impairment Effective Techniques: Written cues;Other (Comment) (Lingraphica) Non-Verbal Means of Communication: Writing Lynder Parents) Written Expression Dominant Hand: Right Written Expression: Not tested   Oral / Motor  Oral Motor/Sensory Function Overall Oral Motor/Sensory Function: Mild impairment (mild right facial asymmetry) Motor Speech Overall Motor Speech: Impaired Respiration: Within functional limits Phonation: Normal Resonance: Within functional limits Articulation: Impaired Level of Impairment: Phrase Intelligibility: Intelligibility reduced Word: 75-100% accurate Phrase: 75-100% accurate Motor Planning: Impaired Level of Impairment: Word Motor Speech Errors: Aware;Groping for words;Inconsistent           Thank you,  Havery Moros, CCC-SLP [phone]  Carron Jaggi 09/08/2023, 3:37 PM

## 2023-09-08 NOTE — Progress Notes (Addendum)
I connected with  Robin [name] on 09/08/23 by a video enabled telemedicine application and verified that I am speaking with the correct Robin [name] using two identifiers.   I discussed the limitations of evaluation and management by telemedicine. The patient expressed understanding and agreed to proceed.  Location of patient: [facility] Location of physician: [facility]   Subjective: No further clinical seizures overnight.  Daughter states patient appears back to baseline.  Patient does reports mild pain in right upper extremity.  Denies any other concerns.  Does not remember having seizure yesterday  ROS: negative except above  Examination  Vital signs in last 24 hours: Temp:  [99.4 F (37.4 C)-100.7 F (38.2 C)] 100 F (37.8 C) (02/02 2037) Pulse Rate:  [93-103] 98 (02/03 0751) Resp:  [16-17] 16 (02/03 0751) BP: (114-151)/(74-99) 114/74 (02/03 0751) SpO2:  [95 %-99 %] 95 % (02/03 0751) Weight:  [109 kg] 109 kg (02/02 1340)  General: Sitting in bed, not in apparent distress Neuro: AOx3, able to follow commands but has trouble naming objects, cranial nerves appear grossly intact, antigravity strength in all 4 extremities with right hemiparesis (uses left hand to initially raise the right arm but then is able to hold the right upper extremity against gravity with gradual drift to bed), FTN intact bilaterally  Basic Metabolic Panel: Recent Labs  Lab 09/07/23 1318 09/07/23 1343 09/08/23 0442  NA 136  --  138  K 4.2  --  3.9  CL 104  --  106  CO2 22  --  22  GLUCOSE 177*  --  129*  BUN 21*  --  21*  CREATININE 1.89*  --  1.83*  CALCIUM 9.3  --  9.0  MG  --  2.0  --     CBC: Recent Labs  Lab 09/07/23 1318 09/08/23 0442  WBC 7.0 6.7  NEUTROABS 4.0  --   HGB 14.1 14.2  HCT 44.1 46.0  MCV 85.0 85.8  PLT 250 228     Coagulation Studies: Recent Labs    09/07/23 1318  LABPROT 18.6*  INR 1.5*    Imaging personally reviewed  CT head without  contrast 09/07/2023: No evidence of acute intracranial abnormality.  Unchanged large chronic left MCA infarct.  MRI brain without contrast 09/07/2023: No acute intracranial abnormality.  Large chronic left MCA infarct. Hypoplastic distal left vertebral artery with absent flow void suggesting slow flow or occlusion.   ASSESSMENT AND PLAN: 58 year old female with prior left MCA infarct and baseline aphasia who presented with new onset right-sided twitching concerning for focal seizure.  New onset focal epilepsy Seizure, sequela of prior stroke Chronic left MCA infarct CKD -No evidence of infection.  Electrolytes within normal limits.  UDS negative. -Likely new onset focal epilepsy in the setting of underlying stroke  Recommendations -EEG did not show any further seizures -Recommend continuing Keppra 250 mg twice daily -Continue seizure precautions -As needed IV Ativan for clinical seizures while in the hospital -Follow-up with neurology in 2 to 3 months (order placed) -Discussed plan with patient and daughter at bedside -Discussed plan with Dr. Sherryll Burger via secure chat  Right upper extremity pain -Could be secondary to seizure -Discussed with Dr. Sherryll Burger to examine.  If muscle pain, can give as needed Tylenol.  Otherwise can consider x-ray to look for dislocation/fracture  I have spent a total of  35  minutes with the patient reviewing hospital notes,  test results, labs and examining the patient as well as  establishing an assessment and plan. > 50% of time was spent in direct patient care.        Lindie Spruce Epilepsy [facility] For questions after 5pm please refer to AMION to reach the Neurologist on call

## 2023-09-08 NOTE — NC FL2 (Signed)
[place] MEDICAID FL2 LEVEL OF CARE FORM     IDENTIFICATION  Patient Name: Robin [name] Birthdate: 28-Jul-1966 Sex: female Admission Date (Current Location): 09/07/2023  [place] and [place] Number:  Aaron Edelman [id number] R Facility and Address:  [facility],  [address] [place] [address]      Provider Number: [id number]  Attending Physician Name and Address:  Erick Blinks, DO  Relative Name and Phone Number:       Current Level of Care: Hospital Recommended Level of Care: Skilled Nursing Facility Prior Approval Number:    Date Approved/Denied:   PASRR Number: [id number]  Discharge Plan: SNF    Current Diagnoses: Patient Active Problem List   Diagnosis Date Noted   Focal seizures (HCC) 09/07/2023   SIRS (systemic inflammatory response syndrome) (HCC) 09/07/2023   Acute on chronic systolic CHF (congestive heart failure) (HCC) 11/27/2021   Stage 3b chronic kidney disease (CKD) (HCC) 11/27/2021   Microcytic anemia 11/27/2021   Lactic acidosis 11/27/2021   Diabetes mellitus (HCC) 03/09/2021   Type 2 diabetes mellitus with stage 3a chronic kidney disease, with long-term current use of insulin (HCC) 03/09/2021   Acute metabolic encephalopathy 12/31/2020   Hyperglycemia 12/31/2020   Prolonged QT interval 05/12/2020   History of CVA with residual deficit 05/12/2020   Atrial fibrillation, chronic (HCC) 05/12/2020   Right hemiparesis (HCC)    Essential hypertension    Chronic diastolic congestive heart failure (HCC)    Dysphagia due to recent stroke 01/05/2020   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 01/05/2020   Embolic cerebral infarction [facility]) s/p clot retrieval 12/27/2019   Thoracic aortic aneurysm without rupture (HCC)    Atrial fibrillation with RVR (HCC) 05/10/2019   Type 2 diabetes mellitus with diabetic autonomic neuropathy, with long-term current use of insulin (HCC) 09/01/2018   Cocaine abuse (HCC) 08/25/2018   Cigarette  nicotine dependence, uncomplicated 06/07/2015   GERD (gastroesophageal reflux disease) 06/07/2011   COPD (chronic obstructive pulmonary disease) (HCC) 06/07/2011   Arthritis 06/07/2011    Orientation RESPIRATION BLADDER Height & Weight     Self, Time, Situation, Place  Normal External catheter Weight: 240 lb 4.8 oz (109 kg) Height:  5\' 7"  (170.2 cm)  BEHAVIORAL SYMPTOMS/MOOD NEUROLOGICAL BOWEL NUTRITION STATUS    Convulsions/Seizures Incontinent Diet (See d/c summary)  AMBULATORY STATUS COMMUNICATION OF NEEDS Skin   Extensive Assist Verbally Normal                       Personal Care Assistance Level of Assistance  Bathing, Dressing, Feeding Bathing Assistance: Maximum assistance Feeding assistance: Limited assistance Dressing Assistance: Maximum assistance     Functional Limitations Info  Sight, Hearing, Speech Sight Info: Adequate Hearing Info: Adequate Speech Info: Impaired    SPECIAL CARE FACTORS FREQUENCY  PT (By licensed PT)     PT Frequency: 5x weekly              Contractures      Additional Factors Info  Code Status, Allergies Code Status Info: Full code Allergies Info: Bee Venom, Losartan, Naproxen, Penicillins, Lisinopril           Current Medications (09/08/2023):  This is the current hospital active medication list Current Facility-Administered Medications  Medication Dose Route Frequency Provider Last Rate Last Admin   acetaminophen (TYLENOL) tablet 650 mg  650 mg Oral Q6H PRN Emokpae, Ejiroghene E, MD   650 mg at 09/08/23 0944   Or   acetaminophen (TYLENOL) suppository 650  mg  650 mg Rectal Q6H PRN Emokpae, Ejiroghene E, MD       apixaban (ELIQUIS) tablet 5 mg  5 mg Oral BID Emokpae, Ejiroghene E, MD   5 mg at 09/08/23 0750   carvedilol (COREG) tablet 25 mg  25 mg Oral BID WC Emokpae, Ejiroghene E, MD   25 mg at 09/08/23 0749   famotidine (PEPCID) tablet 20 mg  20 mg Oral BID Emokpae, Ejiroghene E, MD   20 mg at 09/08/23 0749   gabapentin  (NEURONTIN) capsule 200 mg  200 mg Oral TID Sherryll Burger, Pratik D, DO   200 mg at 09/08/23 1112   insulin aspart (novoLOG) injection 0-5 Units  0-5 Units Subcutaneous QHS Emokpae, Ejiroghene E, MD       insulin aspart (novoLOG) injection 0-9 Units  0-9 Units Subcutaneous TID WC Emokpae, Ejiroghene E, MD   2 Units at 09/08/23 1240   insulin glargine-yfgn (SEMGLEE) injection 15 Units  15 Units Subcutaneous Daily Emokpae, Ejiroghene E, MD   15 Units at 09/08/23 1112   levETIRAcetam (KEPPRA) tablet 250 mg  250 mg Oral BID Emokpae, Ejiroghene E, MD   250 mg at 09/08/23 0750   LORazepam (ATIVAN) injection 2 mg  2 mg Intravenous Q5 Min x 2 PRN Emokpae, Ejiroghene E, MD       ondansetron (ZOFRAN) tablet 4 mg  4 mg Oral Q6H PRN Emokpae, Ejiroghene E, MD       Or   ondansetron (ZOFRAN) injection 4 mg  4 mg Intravenous Q6H PRN Emokpae, Ejiroghene E, MD       polyethylene glycol (MIRALAX / GLYCOLAX) packet 17 g  17 g Oral Daily PRN Emokpae, Ejiroghene E, MD         Discharge Medications: Please see discharge summary for a list of discharge medications.  Relevant Imaging Results:  Relevant Lab Results:   Additional Information SSN: [id number]  Karn Cassis, LCSW

## 2023-09-08 NOTE — TOC Progression Note (Signed)
Transition of Care [facility]) - Progression Note    Patient Details  Name: Robin [name] MRN: [id number] Date of Birth: Jul 17, 1966  Transition of Care [facility]) CM/SW Contact  Karn Cassis, [name] Phone Number: 09/08/2023, 1:00 PM  Clinical Narrative:  PT recommending SNF. LCSW spoke with pt at bedside with pt's sister on phone. Pt/family agreeable. Request [facility] or [facility] SNF. Reviewed Medicare.gov ratings. Will initiate bed search and SNF auth.      Expected Discharge Plan: Home/Self Care Barriers to Discharge: Continued Medical Work up  Expected Discharge Plan and Services In-house Referral: Clinical Social Work     Living arrangements for the past 2 months: Apartment                                       Social Determinants of Health (SDOH) Interventions SDOH Screenings   Food Insecurity: No Food Insecurity (09/07/2023)  Housing: Low Risk  (09/07/2023)  Transportation Needs: No Transportation Needs (09/07/2023)  Utilities: Not At Risk (09/07/2023)  Depression (PHQ2-9): Low Risk  (02/05/2023)  Tobacco Use: Medium Risk (09/04/2023)    Readmission Risk Interventions    09/08/2023    9:27 AM  Readmission Risk Prevention Plan  Transportation Screening Complete  HRI or Home Care Consult Complete  Social Work Consult for Recovery Care Planning/Counseling Complete  Palliative Care Screening Not Applicable  Medication Review Oceanographer) Complete

## 2023-09-08 NOTE — Evaluation (Signed)
Physical Therapy Evaluation Patient Details Name: Robin [name] MRN: [id number] DOB: October 05, 1965 Today's Date: 09/08/2023  History of Present Illness  Robin [name] is a 58 y.o. female with medical history significant for stroke with residual  deficits, systolic and diastolic CHF, atrial fibrillation, COPD, diabetes mellitus, hypertension, CKD 3, aortic stenosis.  Patient was brought to the ED via EMS reports of twitching.  At the time of my evaluation, patient's daughter [name] is at bedside, patient is lethargic, able to answer a few questions.  Patient was with her son when per reports, noticed patient was not as responsive as she normally is, noticed twitching to the right side, and worsening of her baseline slurred speech.  Last known normal 12:30 PM  She has baseline weakness to the right side, and slurred speech, but family is able to understand her.  Patient reports over the past 2 to 3 days she has been feeling unwell.  She reports a mild cough,no difficulty breathing, no nausea no vomiting no abdominal pain, she denies urinary symptoms.   Clinical Impression  Patient demonstrates labored movement for sitting up , requires repeated attempts before able to complete sit to stands, fair/good return for ambulating in room, hallway using SPC, but limited mostly due to c/o dizziness and stomach discomfort and put back to bed due to not feeling well - nurse notified.  Patient will benefit from continued skilled physical therapy in hospital and recommended venue below to increase strength, balance, endurance for safe ADLs and gait.         If plan is discharge home, recommend the following: A lot of help with bathing/dressing/bathroom;Help with stairs or ramp for entrance;Assistance with cooking/housework;A little help with walking and/or transfers   Can travel by private vehicle   Yes    Equipment Recommendations None recommended by PT  Recommendations for Other Services       Functional  Status Assessment Patient has had a recent decline in their functional status and demonstrates the ability to make significant improvements in function in a reasonable and predictable amount of time.     Precautions / Restrictions Precautions Precautions: Fall Restrictions Weight Bearing Restrictions Per Provider Order: No      Mobility  Bed Mobility Overal bed mobility: Needs Assistance Bed Mobility: Supine to Sit, Sit to Supine     Supine to sit: Min assist Sit to supine: Min assist   General bed mobility comments: increased time, labored movement    Transfers Overall transfer level: Needs assistance Equipment used: Straight cane Transfers: Sit to/from Stand, Bed to chair/wheelchair/BSC Sit to Stand: Contact guard assist   Step pivot transfers: Contact guard assist       General transfer comment: requires increased time, repeated attempts for completing sit to stands having to push off from bed rail or armrest of chair    Ambulation/Gait Ambulation/Gait assistance: Contact guard assist Gait Distance (Feet): 20 Feet Assistive device: Straight cane Gait Pattern/deviations: Decreased step length - right, Decreased step length - left, Decreased stride length, Step-to pattern Gait velocity: decreased     General Gait Details: slightly labored cadence with mostly step-to pattern using SPC without loss of balance, limited mostly due to c/o dizziness and stomach pain/discomfort  Stairs            Wheelchair Mobility     Tilt Bed    Modified Rankin (Stroke Patients Only)       Balance Overall balance assessment: Needs assistance Sitting-balance support: Feet supported, No upper extremity  supported Sitting balance-Leahy Scale: Fair Sitting balance - Comments: fair/good seated at EOB   Standing balance support: During functional activity, No upper extremity supported Standing balance-Leahy Scale: Poor Standing balance comment: fair/poor without AD, fair  using SPC                             Pertinent Vitals/Pain Pain Assessment Pain Assessment: No/denies pain    Home Living Family/patient expects to be discharged to:: Private residence Living Arrangements: Other (Comment);Non-relatives/Friends Available Help at Discharge: Family;Available PRN/intermittently;Personal care attendant Type of Home: House Home Access: Stairs to enter Entrance Stairs-Rails: None Entrance Stairs-Number of Steps: 3-4   Home Layout: One level Home Equipment: Agricultural consultant (2 wheels);Cane - single point;BSC/3in1;Shower seat      Prior Function Prior Level of Function : Needs assist       Physical Assist : Mobility (physical);ADLs (physical) Mobility (physical): Bed mobility;Transfers;Gait;Stairs   Mobility Comments: household ambulation using SPC ADLs Comments: Home aides during the day     Extremity/Trunk Assessment   Upper Extremity Assessment Upper Extremity Assessment: Defer to OT evaluation    Lower Extremity Assessment Lower Extremity Assessment: Generalized weakness    Cervical / Trunk Assessment Cervical / Trunk Assessment: Normal  Communication   Communication Communication: Difficulty communicating thoughts/reduced clarity of speech;Other (comment) (expressive aphasia) Cueing Techniques: Verbal cues;Tactile cues  Cognition Arousal: Alert Behavior During Therapy: WFL for tasks assessed/performed Overall Cognitive Status: Within Functional Limits for tasks assessed                                          General Comments      Exercises     Assessment/Plan    PT Assessment Patient needs continued PT services  PT Problem List Decreased strength;Decreased activity tolerance;Decreased balance;Decreased mobility       PT Treatment Interventions DME instruction;Gait training;Stair training;Functional mobility training;Therapeutic activities;Therapeutic exercise;Balance training;Patient/family  education    PT Goals (Current goals can be found in the Care Plan section)  Acute Rehab PT Goals Patient Stated Goal: return home PT Goal Formulation: With patient Time For Goal Achievement: 09/22/23 Potential to Achieve Goals: Good    Frequency Min 3X/week     Co-evaluation               AM-PAC PT "6 Clicks" Mobility  Outcome Measure Help needed turning from your back to your side while in a flat bed without using bedrails?: A Little Help needed moving from lying on your back to sitting on the side of a flat bed without using bedrails?: A Little Help needed moving to and from a bed to a chair (including a wheelchair)?: A Little Help needed standing up from a chair using your arms (e.g., wheelchair or bedside chair)?: A Little Help needed to walk in hospital room?: A Little Help needed climbing 3-5 steps with a railing? : A Lot 6 Click Score: 17    End of Session   Activity Tolerance: Patient tolerated treatment well;Patient limited by fatigue Patient left: in bed;with call bell/phone within reach Nurse Communication: Mobility status PT Visit Diagnosis: Unsteadiness on feet (R26.81);Muscle weakness (generalized) (M62.81);Other abnormalities of gait and mobility (R26.89)    Time: 1050-1120 PT Time Calculation (min) (ACUTE ONLY): 30 min   Charges:   PT Evaluation $PT Eval Moderate Complexity: 1 Mod PT Treatments $Therapeutic Activity: 23-37  mins PT General Charges $$ ACUTE PT VISIT: 1 Visit         2:08 PM, 09/08/23 Ocie Bob, MPT Physical Therapist with [facility] [phone] office [phone] mobile phone

## 2023-09-08 NOTE — Procedures (Signed)
Patient Name: Robin [name]  MRN: [id number]  Epilepsy Attending: Charlsie Quest  Referring Physician/Provider: Onnie Boer, MD  Date: 09/08/2023 Duration: 23.36 mins  Patient history: 58yo F with reported tremors to her right side. EEG to evaluate for seizure   Level of alertness: Awake, asleep  AEDs during EEG study: GBP, LEV  Technical aspects: This EEG study was done with scalp electrodes positioned according to the 10-20 International system of electrode placement. Electrical activity was reviewed with band pass filter of 1-70Hz , sensitivity of 7 uV/mm, display speed of 64mm/sec with a 60Hz  notched filter applied as appropriate. EEG data were recorded continuously and digitally stored.  Video monitoring was available and reviewed as appropriate.  Description: The posterior dominant rhythm consists of 7.5 Hz activity of moderate voltage (25-35 uV) seen predominantly in posterior head regions, symmetric and reactive to eye opening and eye closing. Sleep was characterized by vertex waves, sleep spindles (12 to 14 Hz), maximal frontocentral region.  EEG showed continuous 3 to 5 Hz theta- delta slowing in left hemisphere, maximal left frontotemporal region.  Additionally there is intermittent 3-5 hz theta- delta slowing in right hemisphere.  Hyperventilation and photic stimulation were not performed.     ABNORMALITY -Continuous slow, left hemisphere, maximal left frontotemporal region -Intermittent slow, right hemisphere  IMPRESSION: This study is suggestive of cortical dysfunction in left hemisphere, maximal left frontotemporal region consistent with underlying encephalomalacia. Additionally there is thick cortical dysfunction in right hemisphere. No seizures or definite epileptiform discharges were seen throughout the recording.  Please note lack of epileptiform activity during interictal EEG does not exclude the diagnosis of epilepsy.  Daeron Carreno Annabelle Harman

## 2023-09-08 NOTE — Plan of Care (Signed)
  Problem: Acute Rehab PT Goals(only PT should resolve) Goal: Pt Will Go Supine/Side To Sit Outcome: Progressing Flowsheets (Taken 09/08/2023 1409) Pt will go Supine/Side to Sit:  with supervision  with contact guard assist Goal: Patient Will Transfer Sit To/From Stand Outcome: Progressing Flowsheets (Taken 09/08/2023 1409) Patient will transfer sit to/from stand:  with supervision  with contact guard assist Goal: Pt Will Transfer Bed To Chair/Chair To Bed Outcome: Progressing Flowsheets (Taken 09/08/2023 1409) Pt will Transfer Bed to Chair/Chair to Bed: with supervision Goal: Pt Will Ambulate Outcome: Progressing Flowsheets (Taken 09/08/2023 1409) Pt will Ambulate:  50 feet  with supervision  with cane   2:10 PM, 09/08/23 Ocie Bob, MPT Physical Therapist with [facility] [phone] office [phone] mobile phone

## 2023-09-09 DIAGNOSIS — R569 Unspecified convulsions: Secondary | ICD-10-CM | POA: Diagnosis not present

## 2023-09-09 LAB — GLUCOSE, CAPILLARY
Glucose-Capillary: 130 mg/dL — ABNORMAL HIGH (ref 70–99)
Glucose-Capillary: 157 mg/dL — ABNORMAL HIGH (ref 70–99)
Glucose-Capillary: 173 mg/dL — ABNORMAL HIGH (ref 70–99)
Glucose-Capillary: 181 mg/dL — ABNORMAL HIGH (ref 70–99)

## 2023-09-09 LAB — CBC
HCT: 47.3 % — ABNORMAL HIGH (ref 36.0–46.0)
Hemoglobin: 14.9 g/dL (ref 12.0–15.0)
MCH: 27.2 pg (ref 26.0–34.0)
MCHC: 31.5 g/dL (ref 30.0–36.0)
MCV: 86.3 fL (ref 80.0–100.0)
Platelets: 227 10*3/uL (ref 150–400)
RBC: 5.48 MIL/uL — ABNORMAL HIGH (ref 3.87–5.11)
RDW: 15.3 % (ref 11.5–15.5)
WBC: 5 10*3/uL (ref 4.0–10.5)
nRBC: 0 % (ref 0.0–0.2)

## 2023-09-09 LAB — BASIC METABOLIC PANEL
Anion gap: 11 (ref 5–15)
BUN: 20 mg/dL (ref 6–20)
CO2: 20 mmol/L — ABNORMAL LOW (ref 22–32)
Calcium: 8.8 mg/dL — ABNORMAL LOW (ref 8.9–10.3)
Chloride: 107 mmol/L (ref 98–111)
Creatinine, Ser: 1.63 mg/dL — ABNORMAL HIGH (ref 0.44–1.00)
GFR, Estimated: 37 mL/min — ABNORMAL LOW (ref >60)
Glucose, Bld: 114 mg/dL — ABNORMAL HIGH (ref 70–99)
Potassium: 3.9 mmol/L (ref 3.5–5.1)
Sodium: 138 mmol/L (ref 135–145)

## 2023-09-09 LAB — MAGNESIUM: Magnesium: 1.9 mg/dL (ref 1.7–2.4)

## 2023-09-09 MED ORDER — LEVETIRACETAM 250 MG PO TABS: 250.0000 mg | ORAL_TABLET | Freq: Two times a day (BID) | ORAL | 2 refills | Status: DC

## 2023-09-09 NOTE — Plan of Care (Signed)

## 2023-09-09 NOTE — TOC Progression Note (Signed)
 Transition of Care [facility]) - Progression Note    Patient Details  Name: Robin [name] MRN: [id number] Date of Birth: Nov 08, 1965  Transition of [facility] CM/SW Contact  [name], [name] Phone Number: 09/09/2023, 3:30 PM  Clinical Narrative:  Shara still pending. Pt/sister accepted bed offer at [facility] this morning. Facility has deadline of 5:00 for pt to arrive at SNF. LCSW updated MD, RN, SNF, and pt's sister that shara is still pending. TOC will follow.        Expected Discharge Plan: Home/Self Care Barriers to Discharge: Continued Medical Work up  Expected Discharge Plan and Services In-house Referral: Clinical Social Work     Living arrangements for the past 2 months: Apartment Expected Discharge Date: 09/09/23                                     Social Determinants of Health (SDOH) Interventions SDOH Screenings   Food Insecurity: No Food Insecurity (09/07/2023)  Housing: Low Risk  (09/07/2023)  Transportation Needs: No Transportation Needs (09/07/2023)  Utilities: Not At Risk (09/07/2023)  Depression (PHQ2-9): Low Risk  (02/05/2023)  Tobacco Use: Medium Risk (09/04/2023)    Readmission Risk Interventions    09/08/2023    9:27 AM  Readmission Risk Prevention Plan  Transportation Screening Complete  HRI or Home Care Consult Complete  Social Work Consult for Recovery Care Planning/Counseling Complete  Palliative Care Screening Not Applicable  Medication Review Oceanographer) Complete

## 2023-09-09 NOTE — Progress Notes (Deleted)
 Name: Robin [name]  Age/ Sex: 58 y.o., female   MRN/ DOB: [id number], 09-15-1965     PCP: Job Lukes, PA   Reason for Endocrinology Evaluation: Type 2 Diabetes Mellitus  Initial Endocrine Consultative Visit: 10/01/2018    PATIENT IDENTIFIER: [name] is a 58 y.o. female with a past medical history of T2DM, CHF, COPD , CVA, A.Fib with RVR and GERD. The patient has followed with Endocrinology clinic since 10/01/2018 for consultative assistance with management of her diabetes.  DIABETIC HISTORY:  Ms. Paccione was diagnosed with DM > 20 yrs ago. Has been on Janumet  and Glipizide in the past. Has been on insulin  for years. Her hemoglobin A1c has ranged from 7.9% in 2017, peaking at 11.3% in 2020  Started trulicity  06/2021 Started  Jardiance  04/2022  SUBJECTIVE:   During the last visit (05/09/2023): A1c 8.8%  Today (09/09/2023): Ms. [name] is here for a follow up on diabetes management.  She is accompanied by her sister today.  She checks glucose 23x  a day. No hypoglycemia    She  follows with cardiology for A-fib, CHF  She presented to the ED 09/07/2023 with focal seizures   HOME DIABETES REGIMEN:  Jardiance  10 mg daily  Trulicity  3 mg weekly  Lantus  44 units daily NovoLog  12 units 3 times daily before every meal    METER DOWNLOAD SUMMARY: unable to download 90 day average 212 mg/dL    821- 639  mg/dL    DIABETIC COMPLICATIONS: Microvascular complications:  Neuropathy Denies: CKD, retinopathy  Last eye exam: Completed many years ago    Macrovascular complications:  CVA 01/2020 Denies: CAD, PVA    HISTORY:  Past Medical History:  Past Medical History:  Diagnosis Date   Allergy    Anemia    Aortic stenosis    a. 02/2023 Echo: mild-mod AS.   Arthritis    Chronic abdominal pain    Chronic HFrEF (heart failure with reduced ejection fraction) (HCC)    a. EF at 30-35% by echo in 08/2018 b. EF at 35% by repeat echo in 05/2019 c. EF improved to 65-70%  in 2021 d. EF at 40-45% in 08/2021; e. 02/2023 Echo: 40-45%, glob HK, mod LVH, nl RV size/fx, sev BAE, mild-mod AS.   Cocaine abuse (HCC)    COPD (chronic obstructive pulmonary disease) (HCC)    Diabetes mellitus without complication (HCC)    Essential hypertension, benign    Expressive aphasia    post CVA   Fatty liver    GERD (gastroesophageal reflux disease)    Gout 2016   Normal coronary arteries    a. 10/2008 abnl MV; b. 10/2008 Cath: nl cors.   Ovarian cyst    Permanent atrial fibrillation (HCC)    Stroke (HCC) 12/26/2019   Right sided weakness, and expressive aphasia   Thoracic ascending aortic aneurysm (HCC)    a. 4 cm 10/31/19 CTA; b. 02/2023 Echo: Nl Ao root.   Type 2 diabetes mellitus (HCC)    type II   Past Surgical History:  Past Surgical History:  Procedure Laterality Date   ABDOMINAL HYSTERECTOMY  09/10/2011   Procedure: HYSTERECTOMY ABDOMINAL;  Surgeon: Norleen LULLA Server, MD;  Location: [facility];  Service: Gynecology;  Laterality: N/A;  Abdominal hysterectomy   CESAREAN SECTION  8011,8007, and 1994   CHOLECYSTECTOMY  1995   IR 3D INDEPENDENT WKST  03/16/2020   IR ANGIO INTRA EXTRACRAN SEL INTERNAL CAROTID BILAT MOD SED  03/16/2020   IR ANGIO  VERTEBRAL SEL SUBCLAVIAN INNOMINATE UNI L MOD SED  03/16/2020   IR ANGIO VERTEBRAL SEL VERTEBRAL UNI R MOD SED  03/16/2020   IR CT HEAD LTD  12/27/2019   IR PERCUTANEOUS ART THROMBECTOMY/INFUSION INTRACRANIAL INC DIAG ANGIO  12/27/2019   IR US  GUIDE VASC ACCESS RIGHT  12/27/2019   IR US  GUIDE VASC ACCESS RIGHT  03/16/2020   RADIOLOGY WITH ANESTHESIA N/A 12/27/2019   Procedure: IR WITH ANESTHESIA;  Surgeon: Dolphus Carrion, MD;  Location: [facility];  Service: Radiology;  Laterality: N/A;   SCAR REVISION  09/10/2011   Procedure: SCAR REVISION;  Surgeon: Norleen LULLA Server, MD;  Location: [facility];  Service: Gynecology;  Laterality: N/A;  Wide Excision of old Cicatrix   TUBAL LIGATION  1994   Social History:  reports that she quit smoking about 3 years  ago. Her smoking use included cigarettes. She started smoking about 32 years ago. She has never used smokeless tobacco. She reports that she does not currently use alcohol . She reports that she does not currently use drugs. Family History:  Family History  Problem Relation Age of Onset   Cirrhosis Mother    Early death Mother    Alcohol  abuse Mother    Diabetes type II Father    Alcohol  abuse Father    Diabetes type II Sister    Early death Brother    Alcohol  abuse Son    Anesthesia problems Neg Hx    Hypotension Neg Hx    Malignant hyperthermia Neg Hx    Pseudochol deficiency Neg Hx    Colon cancer Neg Hx    Colon polyps Neg Hx    Esophageal cancer Neg Hx    Rectal cancer Neg Hx    Stomach cancer Neg Hx      HOME MEDICATIONS: Allergies as of 09/10/2023       Reactions   Bee Venom Shortness Of Breath, Swelling   Bodily Swelling   Losartan  Other (See Comments)   Nosebleeds per patient report.    Naproxen  Other (See Comments)   Acid reflux   Penicillins Nausea Only   Lisinopril     Sinus congestion        Medication List      Notice   This visit is during an admission. Changes to the med list made in this visit will be reflected in the After Visit Summary of the admission.      OBJECTIVE:   Vital Signs: LMP 08/21/2011   Wt Readings from Last 3 Encounters:  09/07/23 240 lb 4.8 oz (109 kg)  06/30/23 242 lb (109.8 kg)  05/09/23 236 lb (107 kg)     Exam: General: Pt appears well and is in NAD  Lungs: Clear with good BS bilat   Heart: RRR   Extremities: No pretibial edema.   Neuro: MS is good with appropriate affect, pt is alert and Ox3    DM foot exam:05/09/2023 per podiatry  The skin of the feet is intact without sores or ulcerations. The pedal pulses are 2+ The sensation is decreased  to a screening 5.07, 10 gram monofilament bilaterally        DATA REVIEWED:  Lab Results  Component Value Date   HGBA1C 8.8 (A) 05/09/2023   HGBA1C 8.2 (H)  12/02/2022   HGBA1C 7.5 (A) 10/30/2022    Latest Reference Range & Units 04/08/23 14:53  Sodium 135 - 145 mmol/L 136  Potassium 3.5 - 5.1 mmol/L 3.8  Chloride 98 - 111 mmol/L 105  CO2 22 - 32 mmol/L 23  Glucose 70 - 99 mg/dL 829 (H)  BUN 6 - 20 mg/dL 23 (H)  Creatinine 9.55 - 1.00 mg/dL 8.22 (H)  Calcium  8.9 - 10.3 mg/dL 8.4 (L)  Anion gap 5 - 15  8  GFR, Estimated >60 mL/min 33 (L)  (H): Data is abnormally high (L): Data is abnormally low    ASSESSMENT / PLAN / RECOMMENDATIONS:   1) Type 2 Diabetes Mellitus, Poorly controlled, With Neuropathic and CKD III  and macrovascular complications - Most recent A1c of  8.8  %. Goal A1c < 7.0 %.    -Patient has been noted with hypoglycemia -This is partly due to being on less dose of Trulicity  due to shortage of supplies of the 3 mg dose, will increase -I will increase the Jardiance  as below -Metformin  has been stopped due to low GFR -Main barriers to diabetes self care is memory issues and CVA -She was able to obtain the Dexcom  MEDICATIONS: Continue  Lantus   44 units daily Continue NovoLog  12 units with each meal Increase trulicity  3 mg weekly  Increase Jardiance  25 mg daily  EDUCATION / INSTRUCTIONS: BG monitoring instructions: Patient is instructed to check her blood sugars 2 times a day, fasting and supper. Call [facility] if: BG persistently < 70  I reviewed the Rule of 15 for the treatment of hypoglycemia in detail with the patient. Literature supplied.   2) Diabetic complications:  Eye: Does not have known diabetic retinopathy.   Neuro/ Feet: Does  have known diabetic peripheral neuropathy .  Renal: Patient does  have known baseline CKD        F/U in 4 months    Signed electronically by: Stefano Redgie Butts, MD  [facility] Endocrinology  [facility] [address] [place], [name] [address] Phone: [phone] FAX: [phone]   CC: Job Lukes, [place] [address] [name] [address] Phone: [phone]  Fax: [phone]  Return to Endocrinology clinic as below: Future Appointments  Date Time Provider Department Center  09/10/2023  9:30 AM Paizlee Kinder, Donell Redgie, MD [facility] None  09/10/2023 11:00 AM Fayette Lamar GAILS, CCC-SLP [facility] None  09/16/2023  9:40 AM Job Lukes, PA [facility] P[facility]  01/12/2024  1:00 PM Branch, Dorn FALCON, MD [facility]

## 2023-09-09 NOTE — Plan of Care (Signed)
  Problem: Education: Goal: Knowledge of General Education information will improve Description: Including pain rating scale, medication(s)/side effects and non-pharmacologic comfort measures Outcome: Progressing   Problem: Health Behavior/Discharge Planning: Goal: Ability to manage health-related needs will improve Outcome: Progressing   Problem: Clinical Measurements: Goal: Ability to maintain clinical measurements within normal limits will improve Outcome: Progressing Goal: Will remain free from infection Outcome: Progressing Goal: Diagnostic test results will improve Outcome: Progressing Goal: Respiratory complications will improve Outcome: Progressing Goal: Cardiovascular complication will be avoided Outcome: Progressing   Problem: Activity: Goal: Risk for activity intolerance will decrease Outcome: Progressing   Problem: Nutrition: Goal: Adequate nutrition will be maintained Outcome: Progressing   Problem: Coping: Goal: Level of anxiety will decrease Outcome: Progressing   Problem: Elimination: Goal: Will not experience complications related to bowel motility Outcome: Progressing Goal: Will not experience complications related to urinary retention Outcome: Progressing   Problem: Pain Managment: Goal: General experience of comfort will improve and/or be controlled Outcome: Progressing   Problem: Safety: Goal: Ability to remain free from injury will improve Outcome: Progressing   Problem: Skin Integrity: Goal: Risk for impaired skin integrity will decrease Outcome: Progressing   Problem: Education: Goal: Expressions of having a comfortable level of knowledge regarding the disease process will increase Outcome: Progressing   Problem: Coping: Goal: Ability to adjust to condition or change in health will improve Outcome: Progressing Goal: Ability to identify appropriate support needs will improve Outcome: Progressing   Problem: Health Behavior/Discharge  Planning: Goal: Compliance with prescribed medication regimen will improve Outcome: Progressing   Problem: Medication: Goal: Risk for medication side effects will decrease Outcome: Progressing   Problem: Clinical Measurements: Goal: Complications related to the disease process, condition or treatment will be avoided or minimized Outcome: Progressing Goal: Diagnostic test results will improve Outcome: Progressing   Problem: Safety: Goal: Verbalization of understanding the information provided will improve Outcome: Progressing   Problem: Self-Concept: Goal: Level of anxiety will decrease Outcome: Progressing Goal: Ability to verbalize feelings about condition will improve Outcome: Progressing   Problem: Education: Goal: Ability to describe self-care measures that may prevent or decrease complications (Diabetes Survival Skills Education) will improve Outcome: Progressing Goal: Individualized Educational Video(s) Outcome: Progressing   Problem: Coping: Goal: Ability to adjust to condition or change in health will improve Outcome: Progressing   Problem: Fluid Volume: Goal: Ability to maintain a balanced intake and output will improve Outcome: Progressing   Problem: Health Behavior/Discharge Planning: Goal: Ability to identify and utilize available resources and services will improve Outcome: Progressing Goal: Ability to manage health-related needs will improve Outcome: Progressing   Problem: Metabolic: Goal: Ability to maintain appropriate glucose levels will improve Outcome: Progressing   Problem: Nutritional: Goal: Maintenance of adequate nutrition will improve Outcome: Progressing Goal: Progress toward achieving an optimal weight will improve Outcome: Progressing   Problem: Skin Integrity: Goal: Risk for impaired skin integrity will decrease Outcome: Progressing   Problem: Tissue Perfusion: Goal: Adequacy of tissue perfusion will improve Outcome:  Progressing

## 2023-09-09 NOTE — Discharge Summary (Signed)
 Physician Discharge Summary  Charitie Hinote Bartus FMW:[id number] DOB: [birth year] DOA: 09/07/2023  PCP: Job Lukes, PA  Admit date: 09/07/2023  Discharge date: 09/09/2023  Admitted From:Home  Disposition:  SNF  Recommendations for Outpatient Follow-up:  Follow up with PCP in 1-2 weeks Follow-up with neurology in 2-3 months with referral sent Continue Keppra  250 mg twice daily as prescribed Continue other home medications as prior  Home Health: None  Equipment/Devices: None  Discharge Condition:Stable  CODE STATUS: Full  Diet recommendation: Heart Healthy/carb modified  Brief/Interim Summary: Robin [name] is a 58 y.o. female with medical history significant for stroke with residual  deficits, systolic and diastolic CHF, atrial fibrillation, COPD, diabetes mellitus, hypertension, CKD 3, aortic stenosis. Patient was brought to the ED via EMS reports of twitching.  She was admitted for evaluation of focal seizures.  She was evaluated by neurology and had EEG performed demonstrating new onset of focal epilepsy and was started on Keppra  250 mg twice daily.  She denies any other issues or concerns.  No other acute events noted throughout the course of this hospitalization and she is now in stable condition to discharge to SNF as recommended by PT.  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  Discharge Instructions  Discharge Instructions     Ambulatory referral to Neurology    Complete by: As directed    An appointment is requested in approximately: 8-10 weeks   Diet - low sodium heart healthy   Complete by: As directed    Increase activity slowly   Complete by: As directed       Allergies as of 09/09/2023       Reactions   Bee Venom Shortness Of Breath, Swelling   Bodily Swelling   Losartan  Other (See Comments)   Nosebleeds per patient report.    Naproxen  Other (See Comments)   Acid reflux   Penicillins Nausea Only   Lisinopril     Sinus congestion        Medication List     TAKE these medications    Accu-Chek Guide Me w/Device Kit 1 Device by Does not apply route 3 (three) times daily.   Accu-Chek Guide test strip Generic drug: glucose blood USE 1 STRIP TO CHECK GLUCOSE THREE TIMES DAILY   Accu-Chek Softclix Lancets lancets Use as instructed   aspirin  EC 81 MG tablet Take 1 tablet (81 mg total) by mouth daily. Swallow whole.   BD Pen Needle Nano U/F 32G X 4 MM Misc Generic drug: Insulin  Pen Needle Inject 1 Device into the skin in the morning, at noon, in the evening, and at bedtime.   carvedilol  25 MG tablet Commonly known as: COREG  TAKE 2 TABLETS BY MOUTH TWICE DAILY WITH A MEAL   cetaphil lotion Apply 1 Application topically as needed for dry skin.   dicyclomine  20 MG tablet Commonly known as: BENTYL  Take 1 tablet (20 mg total) by mouth 3 (three) times daily before meals.   Eliquis  5 MG Tabs tablet Generic drug: apixaban  Take  1 tablet by mouth twice daily   empagliflozin  25 MG Tabs tablet Commonly known as: Jardiance  Take 1 tablet (25 mg total) by mouth daily before breakfast.   famotidine  20 MG tablet Commonly known as: PEPCID  Take 1 tablet (20 mg total) by mouth 2 (two) times daily.   FeroSul 325 (65 Fe) MG tablet Generic drug: ferrous sulfate  Take 1 tablet by mouth once daily   furosemide  20 MG tablet Commonly known as: LASIX  TAKE 1 TABLET BY MOUTH ONCE DAILY (MAY TAKE ADDITIONAL 20 MG IF WEIGHT GAIN IS OVER  2LBS OVERNIGHT OR 5 LBS IN A WEEK)   gabapentin  100 MG capsule Commonly known as: NEURONTIN  TAKE 2 CAPSULES BY MOUTH THREE TIMES DAILY   hydrALAZINE  25 MG tablet Commonly known as: APRESOLINE  TAKE 1 TABLET BY MOUTH THREE TIMES DAILY   isosorbide  mononitrate 30 MG 24 hr tablet Commonly known as: IMDUR  Take 2 tablets by mouth once daily   Lantus  SoloStar 100 UNIT/ML Solostar Pen Generic drug: insulin  glargine Inject 44 Units into the skin daily.   levETIRAcetam  250 MG tablet Commonly known as: KEPPRA  Take 1 tablet (250 mg total) by mouth 2 (two) times daily.   NovoLOG  FlexPen 100 UNIT/ML FlexPen Generic drug: insulin  aspart Inject 12 Units into the skin 3 (three) times daily with meals.   ondansetron  4 MG disintegrating tablet Commonly known as: ZOFRAN -ODT 4mg  ODT q4 hours prn nausea/vomit   pantoprazole  40 MG tablet Commonly known as: PROTONIX  Take 1 tablet by mouth once daily   timolol  0.5 % ophthalmic solution Commonly known as: TIMOPTIC  Place 1 drop into both eyes 2 (two) times daily.   tobramycin  0.3 % ophthalmic solution Commonly known as: TOBREX  1 drop 4 (four) times daily.   Trulicity  3 MG/0.5ML Soaj Generic drug: Dulaglutide  Inject 3 mg as directed once a week.        Contact information for follow-up providers     Job Lukes, [place]. Schedule an appointment as soon as possible for a visit in 1 week(s).   Specialty: Physician Assistant Contact information: [address] [name] [address] [phone]         [facility]. Go to.   Specialty: Neurology Contact information: [address] [place]  [address] [phone]             Contact information for after-discharge care     Destination     [facility] Preferred SNF .   Service: Skilled Nursing Contact information: [address] [facility] [place]  [address] [phone]                     Allergies  Allergen Reactions   Bee Venom Shortness Of Breath and Swelling    Bodily Swelling   Losartan  Other (See Comments)    Nosebleeds per patient report.    Naproxen  Other (See Comments)    Acid reflux   Penicillins Nausea Only   Lisinopril      Sinus congestion    Consultations: Neurology   Procedures/Studies: EEG adult Result Date: 09/08/2023 Shelton Arlin KIDD, MD     09/08/2023  1:12 PM Patient Name: Loetta Connelley Mikami MRN: [id number] Epilepsy Attending: Arlin KIDD Shelton Referring Physician/Provider: Pearlean Tully BRAVO, MD Date: 09/08/2023 Duration: 23.36 mins Patient history: 58yo F with reported tremors to her right side. EEG to evaluate for seizure Level of alertness: Awake, asleep AEDs during EEG study: GBP, LEV Technical aspects: This EEG study was done  with scalp electrodes positioned according to the 10-20 International system of electrode placement. Electrical activity was reviewed with band pass filter of 1-70Hz , sensitivity of 7 uV/mm, display speed of 54mm/sec with a 60Hz  notched filter applied as appropriate. EEG data were recorded continuously and digitally stored.  Video monitoring was available and reviewed as appropriate. Description: The posterior dominant rhythm consists of 7.5 Hz activity of moderate voltage (25-35 uV) seen predominantly in posterior head regions, symmetric and reactive to eye opening and eye closing. Sleep was characterized by vertex waves, sleep spindles (12 to 14 Hz), maximal frontocentral region.  EEG showed continuous 3 to 5 Hz theta- delta slowing in left hemisphere, maximal left frontotemporal region.  Additionally there is intermittent 3-5 hz theta- delta slowing in right hemisphere.  Hyperventilation and photic stimulation were not performed.   ABNORMALITY -Continuous slow, left hemisphere, maximal left frontotemporal region -Intermittent slow, right hemisphere IMPRESSION: This study is suggestive of cortical dysfunction in left  hemisphere, maximal left frontotemporal region consistent with underlying encephalomalacia. Additionally there is thick cortical dysfunction in right hemisphere. No seizures or definite epileptiform discharges were seen throughout the recording. Please note lack of epileptiform activity during interictal EEG does not exclude the diagnosis of epilepsy. Arlin MALVA Krebs   MR BRAIN WO CONTRAST Result Date: 09/07/2023 CLINICAL DATA:  Mental status change, unknown cause. Acute onset of reduced responsiveness with right-sided twitching. History of left MCA stroke. EXAM: MRI HEAD WITHOUT CONTRAST TECHNIQUE: Multiplanar, multiecho pulse sequences of the brain and surrounding structures were obtained without intravenous contrast. COMPARISON:  Head CT 09/07/2023 and MRI 11/13/2020 FINDINGS: Brain: There is no evidence of an acute infarct, mass, midline shift, or extra-axial fluid collection. A large chronic infarct is again noted anteriorly in the left MCA territory with associated cortical laminar necrosis, chronic blood products, and ex vacuo dilatation of the left lateral ventricle. Wallerian degeneration involves the left cerebral peduncle. No significant chronic small vessel white matter disease is evident. Vascular: Hypoplastic left vertebral artery with absent flow void, either new or more prominent than on the prior MRI. Skull and upper cervical spine: Unremarkable bone marrow signal. Sinuses/Orbits: Unremarkable orbits. Paranasal sinuses and mastoid air cells are clear. Other: None. IMPRESSION: 1. No acute intracranial abnormality. 2. Large chronic left MCA infarct. 3. Hypoplastic distal left vertebral artery with absent flow void suggesting slow flow or occlusion. Electronically Signed   By: Dasie Hamburg M.D.   On: 09/07/2023 16:46   DG Chest Portable 1 View Result Date: 09/07/2023 CLINICAL DATA:  Cough EXAM: PORTABLE CHEST 1 VIEW COMPARISON:  08/17/2023 FINDINGS: Cardiomegaly, vascular congestion. No confluent  opacities, effusions or edema. No acute bony abnormality. IMPRESSION: Cardiomegaly, vascular congestion. Electronically Signed   By: Franky Crease M.D.   On: 09/07/2023 15:41   CT HEAD CODE STROKE WO CONTRAST Result Date: 09/07/2023 CLINICAL DATA:  Code stroke. Aphasia and right-sided weakness. Seizure activity. History of stroke. EXAM: CT HEAD WITHOUT CONTRAST TECHNIQUE: Contiguous axial images were obtained from the base of the skull through the vertex without intravenous contrast. RADIATION DOSE REDUCTION: This exam was performed according to the departmental dose-optimization program which includes automated exposure control, adjustment of the mA and/or kV according to patient size and/or use of iterative reconstruction technique. COMPARISON:  Head CT 11/26/2021 FINDINGS: Brain: There is no evidence of an acute infarct, intracranial hemorrhage, mass, midline shift, or extra-axial fluid collection. There is an unchanged large chronic infarct anteriorly in the left MCA territory with associated ex vacuo dilatation of the  left lateral and third ventricles. The right lateral and fourth ventricles are normal in size. Vascular: No suspicious acute vascular hyperdensity. Skull: No fracture or suspicious osseous lesion. Sinuses/Orbits: Paranasal sinuses and mastoid air cells are clear. Unremarkable orbits. Other: None. ASPECTS (Alberta Stroke Program Early CT Score) - Ganglionic level infarction (caudate, lentiform nuclei, internal capsule, insula, M1-M3 cortex): 7 - Supraganglionic infarction (M4-M6 cortex): 3 Total score (0-10 with 10 being normal): 10 These results were called by telephone at the time of interpretation on 09/07/2023 at 1:33 pm to Dr. Prentice Medicus, who verbally acknowledged these results. IMPRESSION: 1. No evidence of acute intracranial abnormality. 2. Unchanged large chronic left MCA infarct. Electronically Signed   By: Dasie Hamburg M.D.   On: 09/07/2023 13:33   CT ABDOMEN PELVIS WO CONTRAST Result  Date: 08/18/2023 CLINICAL DATA:  Right abdominal pain, nausea EXAM: CT ABDOMEN AND PELVIS WITHOUT CONTRAST TECHNIQUE: Multidetector CT imaging of the abdomen and pelvis was performed following the standard protocol without IV contrast. RADIATION DOSE REDUCTION: This exam was performed according to the departmental dose-optimization program which includes automated exposure control, adjustment of the mA and/or kV according to patient size and/or use of iterative reconstruction technique. COMPARISON:  MRI abdomen/pelvis dated 01/26/2023 FINDINGS: Lower chest: Mild bibasilar atelectasis. Hepatobiliary: Unenhanced liver is unremarkable. Status post cholecystectomy. No intrahepatic or extrahepatic duct dilatation. Pancreas: Within normal limits. Spleen: Within normal limits. Adrenals/Urinary Tract: Adrenal glands are within normal limits. Kidneys are within normal limits. No renal, ureteral, or bladder calculi. No hydronephrosis. Bladder is within normal limits. Stomach/Bowel: Stomach is within normal limits. No evidence of bowel obstruction. Normal appendix (series 2/image 58). No colonic wall thickening or inflammatory changes. Vascular/Lymphatic: No evidence of abdominal aortic aneurysm. Atherosclerotic calcifications of the abdominal aorta and branch vessels. No suspicious abdominopelvic lymphadenopathy. Reproductive: Status post hysterectomy. No adnexal masses. Other: No abdominopelvic ascites. Musculoskeletal: Degenerative changes of the lower thoracic spine. IMPRESSION: No CT findings to account for the patient's right abdominal pain. Normal appendix. Status post cholecystectomy and hysterectomy. Electronically Signed   By: Pinkie Pebbles M.D.   On: 08/18/2023 00:11   DG Chest Port 1 View Result Date: 08/17/2023 CLINICAL DATA:  Right upper quadrant pain, nausea EXAM: PORTABLE CHEST 1 VIEW COMPARISON:  04/08/2023 FINDINGS: Lungs are clear.  No pleural effusion or pneumothorax. Stable cardiomegaly. IMPRESSION:  No acute cardiopulmonary disease. Stable cardiomegaly. Electronically Signed   By: Pinkie Pebbles M.D.   On: 08/17/2023 23:05     Discharge Exam: Vitals:   09/08/23 2034 09/09/23 0538  BP: (!) 133/104 (!) 161/88  Pulse: 93 94  Resp:    Temp: 97.9 F (36.6 C) 97.8 F (36.6 C)  SpO2: 92% 98%   Vitals:   09/08/23 0751 09/08/23 1526 09/08/23 2034 09/09/23 0538  BP: 114/74 133/80 (!) 133/104 (!) 161/88  Pulse: 98 94 93 94  Resp: 16 18    Temp:  (!) 97.5 F (36.4 C) 97.9 F (36.6 C) 97.8 F (36.6 C)  TempSrc:  Oral Oral Oral  SpO2: 95% 96% 92% 98%  Weight:      Height:        General: Pt is alert, awake, not in acute distress Cardiovascular: RRR, S1/S2 +, no rubs, no gallops Respiratory: CTA bilaterally, no wheezing, no rhonchi Abdominal: Soft, NT, ND, bowel sounds + Extremities: no edema, no cyanosis    The results of significant diagnostics from this hospitalization (including imaging, microbiology, ancillary and laboratory) are listed below for reference.  Microbiology: Recent Results (from the past 240 hours)  Resp panel by RT-PCR (RSV, Flu A&B, Covid) Anterior Nasal Swab     Status: None   Collection Time: 09/07/23  3:00 PM   Specimen: Anterior Nasal Swab  Result Value Ref Range Status   SARS Coronavirus 2 by RT PCR NEGATIVE NEGATIVE Final    Comment: (NOTE) SARS-CoV-2 target nucleic acids are NOT DETECTED.  The SARS-CoV-2 RNA is generally detectable in upper respiratory specimens during the acute phase of infection. The lowest concentration of SARS-CoV-2 viral copies this assay can detect is 138 copies/mL. A negative result does not preclude SARS-Cov-2 infection and should not be used as the sole basis for treatment or other patient management decisions. A negative result may occur with  improper specimen collection/handling, submission of specimen other than nasopharyngeal swab, presence of viral mutation(s) within the areas targeted by this assay,  and inadequate number of viral copies(<138 copies/mL). A negative result must be combined with clinical observations, patient history, and epidemiological information. The expected result is Negative.  Fact Sheet for Patients:  bloggercourse.com  Fact Sheet for Healthcare Providers:  seriousbroker.it  This test is no t yet approved or cleared by the [place]  FDA and  has been authorized for detection and/or diagnosis of SARS-CoV-2 by FDA under an Emergency Use Authorization (EUA). This EUA will remain  in effect (meaning this test can be used) for the duration of the COVID-19 declaration under Section 564(b)(1) of the Act, 21 U.S.C.section 360bbb-3(b)(1), unless the authorization is terminated  or revoked sooner.       Influenza A by PCR NEGATIVE NEGATIVE Final   Influenza B by PCR NEGATIVE NEGATIVE Final    Comment: (NOTE) The Xpert Xpress SARS-CoV-2/FLU/RSV plus assay is intended as an aid in the diagnosis of influenza from Nasopharyngeal swab specimens and should not be used as a sole basis for treatment. Nasal washings and aspirates are unacceptable for Xpert Xpress SARS-CoV-2/FLU/RSV testing.  Fact Sheet for Patients: bloggercourse.com  Fact Sheet for Healthcare Providers: seriousbroker.it  This test is not yet approved or cleared by the [place]  FDA and has been authorized for detection and/or diagnosis of SARS-CoV-2 by FDA under an Emergency Use Authorization (EUA). This EUA will remain in effect (meaning this test can be used) for the duration of the COVID-19 declaration under Section 564(b)(1) of the Act, 21 U.S.C. section 360bbb-3(b)(1), unless the authorization is terminated or revoked.     Resp Syncytial Virus by PCR NEGATIVE NEGATIVE Final    Comment: (NOTE) Fact Sheet for Patients: bloggercourse.com  Fact Sheet for  Healthcare Providers: seriousbroker.it  This test is not yet approved or cleared by the [place]  FDA and has been authorized for detection and/or diagnosis of SARS-CoV-2 by FDA under an Emergency Use Authorization (EUA). This EUA will remain in effect (meaning this test can be used) for the duration of the COVID-19 declaration under Section 564(b)(1) of the Act, 21 U.S.C. section 360bbb-3(b)(1), unless the authorization is terminated or revoked.  Performed at [facility], [address] [place], [name] [address]   Culture, blood (Routine X 2) w Reflex to ID Panel     Status: None (Preliminary result)   Collection Time: 09/07/23  6:38 PM   Specimen: BLOOD LEFT FOREARM  Result Value Ref Range Status   Specimen Description BLOOD LEFT FOREARM BLOOD  Final   Special Requests   Final    BOTTLES DRAWN AEROBIC AND ANAEROBIC Blood Culture results may not be optimal due to  an inadequate volume of blood received in culture bottles   Culture   Final    NO GROWTH 2 DAYS Performed at [facility], [address], [place], [name] [address]    Report Status PENDING  Incomplete  Culture, blood (Routine X 2) w Reflex to ID Panel     Status: None (Preliminary result)   Collection Time: 09/07/23  6:48 PM   Specimen: BLOOD LEFT HAND  Result Value Ref Range Status   Specimen Description BLOOD LEFT HAND BLOOD  Final   Special Requests   Final    BOTTLES DRAWN AEROBIC AND ANAEROBIC Blood Culture results may not be optimal due to an inadequate volume of blood received in culture bottles   Culture   Final    NO GROWTH 2 DAYS Performed at [facility], [address] [place], [name] [address]    Report Status PENDING  Incomplete     Labs: BNP (last 3 results) No results for input(s): BNP in the last 8760 hours. Basic Metabolic Panel: Recent Labs  Lab 09/07/23 1318 09/07/23 1343 09/08/23 0442 09/09/23 0433  NA 136  --  138 138  K 4.2  --  3.9 3.9  CL 104  --   106 107  CO2 22  --  22 20*  GLUCOSE 177*  --  129* 114*  BUN 21*  --  21* 20  CREATININE 1.89*  --  1.83* 1.63*  CALCIUM  9.3  --  9.0 8.8*  MG  --  2.0  --  1.9   Liver Function Tests: Recent Labs  Lab 09/07/23 1318  AST 22  ALT 13  ALKPHOS 84  BILITOT 0.7  PROT 7.2  ALBUMIN 3.6   No results for input(s): LIPASE, AMYLASE in the last 168 hours. No results for input(s): AMMONIA in the last 168 hours. CBC: Recent Labs  Lab 09/07/23 1318 09/08/23 0442 09/09/23 0433  WBC 7.0 6.7 5.0  NEUTROABS 4.0  --   --   HGB 14.1 14.2 14.9  HCT 44.1 46.0 47.3*  MCV 85.0 85.8 86.3  PLT 250 228 227   Cardiac Enzymes: Recent Labs  Lab 09/07/23 1343  CKTOTAL 115   BNP: Invalid input(s): POCBNP CBG: Recent Labs  Lab 09/08/23 0805 09/08/23 1122 09/08/23 1711 09/08/23 2153 09/09/23 0736  GLUCAP 130* 154* 166* 177* 130*   D-Dimer No results for input(s): DDIMER in the last 72 hours. Hgb A1c No results for input(s): HGBA1C in the last 72 hours. Lipid Profile No results for input(s): CHOL, HDL, LDLCALC, TRIG, CHOLHDL, LDLDIRECT in the last 72 hours. Thyroid function studies No results for input(s): TSH, T4TOTAL, T3FREE, THYROIDAB in the last 72 hours.  Invalid input(s): FREET3 Anemia work up No results for input(s): VITAMINB12, FOLATE, FERRITIN, TIBC, IRON, RETICCTPCT in the last 72 hours. Urinalysis    Component Value Date/Time   COLORURINE YELLOW 09/07/2023 1500   APPEARANCEUR CLEAR 09/07/2023 1500   LABSPEC 1.015 09/07/2023 1500   PHURINE 5.0 09/07/2023 1500   GLUCOSEU >=500 (A) 09/07/2023 1500   GLUCOSEU >=1000 (A) 09/21/2018 0829   HGBUR NEGATIVE 09/07/2023 1500   BILIRUBINUR NEGATIVE 09/07/2023 1500   BILIRUBINUR Negative 08/14/2022 0957   KETONESUR NEGATIVE 09/07/2023 1500   PROTEINUR NEGATIVE 09/07/2023 1500   UROBILINOGEN 0.2 08/14/2022 0957   UROBILINOGEN 0.2 09/21/2018 0829   NITRITE NEGATIVE 09/07/2023 1500    LEUKOCYTESUR NEGATIVE 09/07/2023 1500   Sepsis Labs Recent Labs  Lab 09/07/23 1318 09/08/23 0442 09/09/23 0433  WBC 7.0 6.7  5.0   Microbiology Recent Results (from the past 240 hours)  Resp panel by RT-PCR (RSV, Flu A&B, Covid) Anterior Nasal Swab     Status: None   Collection Time: 09/07/23  3:00 PM   Specimen: Anterior Nasal Swab  Result Value Ref Range Status   SARS Coronavirus 2 by RT PCR NEGATIVE NEGATIVE Final    Comment: (NOTE) SARS-CoV-2 target nucleic acids are NOT DETECTED.  The SARS-CoV-2 RNA is generally detectable in upper respiratory specimens during the acute phase of infection. The lowest concentration of SARS-CoV-2 viral copies this assay can detect is 138 copies/mL. A negative result does not preclude SARS-Cov-2 infection and should not be used as the sole basis for treatment or other patient management decisions. A negative result may occur with  improper specimen collection/handling, submission of specimen other than nasopharyngeal swab, presence of viral mutation(s) within the areas targeted by this assay, and inadequate number of viral copies(<138 copies/mL). A negative result must be combined with clinical observations, patient history, and epidemiological information. The expected result is Negative.  Fact Sheet for Patients:  bloggercourse.com  Fact Sheet for Healthcare Providers:  seriousbroker.it  This test is no t yet approved or cleared by the [place]  FDA and  has been authorized for detection and/or diagnosis of SARS-CoV-2 by FDA under an Emergency Use Authorization (EUA). This EUA will remain  in effect (meaning this test can be used) for the duration of the COVID-19 declaration under Section 564(b)(1) of the Act, 21 U.S.C.section 360bbb-3(b)(1), unless the authorization is terminated  or revoked sooner.       Influenza A by PCR NEGATIVE NEGATIVE Final   Influenza B by PCR  NEGATIVE NEGATIVE Final    Comment: (NOTE) The Xpert Xpress SARS-CoV-2/FLU/RSV plus assay is intended as an aid in the diagnosis of influenza from Nasopharyngeal swab specimens and should not be used as a sole basis for treatment. Nasal washings and aspirates are unacceptable for Xpert Xpress SARS-CoV-2/FLU/RSV testing.  Fact Sheet for Patients: bloggercourse.com  Fact Sheet for Healthcare Providers: seriousbroker.it  This test is not yet approved or cleared by the [place]  FDA and has been authorized for detection and/or diagnosis of SARS-CoV-2 by FDA under an Emergency Use Authorization (EUA). This EUA will remain in effect (meaning this test can be used) for the duration of the COVID-19 declaration under Section 564(b)(1) of the Act, 21 U.S.C. section 360bbb-3(b)(1), unless the authorization is terminated or revoked.     Resp Syncytial Virus by PCR NEGATIVE NEGATIVE Final    Comment: (NOTE) Fact Sheet for Patients: bloggercourse.com  Fact Sheet for Healthcare Providers: seriousbroker.it  This test is not yet approved or cleared by the [place]  FDA and has been authorized for detection and/or diagnosis of SARS-CoV-2 by FDA under an Emergency Use Authorization (EUA). This EUA will remain in effect (meaning this test can be used) for the duration of the COVID-19 declaration under Section 564(b)(1) of the Act, 21 U.S.C. section 360bbb-3(b)(1), unless the authorization is terminated or revoked.  Performed at [facility], [address], [place], [name] [address]   Culture, blood (Routine X 2) w Reflex to ID Panel     Status: None (Preliminary result)   Collection Time: 09/07/23  6:38 PM   Specimen: BLOOD LEFT FOREARM  Result Value Ref Range Status   Specimen Description BLOOD LEFT FOREARM BLOOD  Final   Special Requests   Final    BOTTLES DRAWN AEROBIC AND  ANAEROBIC Blood Culture results may not be  optimal due to an inadequate volume of blood received in culture bottles   Culture   Final    NO GROWTH 2 DAYS Performed at [facility], [address], [place], [name] [address]    Report Status PENDING  Incomplete  Culture, blood (Routine X 2) w Reflex to ID Panel     Status: None (Preliminary result)   Collection Time: 09/07/23  6:48 PM   Specimen: BLOOD LEFT HAND  Result Value Ref Range Status   Specimen Description BLOOD LEFT HAND BLOOD  Final   Special Requests   Final    BOTTLES DRAWN AEROBIC AND ANAEROBIC Blood Culture results may not be optimal due to an inadequate volume of blood received in culture bottles   Culture   Final    NO GROWTH 2 DAYS Performed at [facility], [address] [place], [name] [address]    Report Status PENDING  Incomplete     Time coordinating discharge: 35 minutes  SIGNED:   Adron JONETTA Fairly, DO [facility] 09/09/2023, 10:34 AM  If 7PM-7AM, please contact night-coverage [email or web address]

## 2023-09-09 NOTE — Progress Notes (Signed)
 Mobility Specialist Progress Note:    09/09/23 1117  Mobility  Activity Transferred from bed to chair  Level of Assistance Contact guard assist, steadying assist  Assistive Device None  Distance Ambulated (ft) 3 ft  Range of Motion/Exercises Active;All extremities  Activity Response Tolerated well  Mobility Referral Yes  Mobility visit 1 Mobility  Mobility Specialist Start Time (ACUTE ONLY) 1100  Mobility Specialist Stop Time (ACUTE ONLY) 1115  Mobility Specialist Time Calculation (min) (ACUTE ONLY) 15 min   Pt received in bed requesting assistance to chair. Nurse in room. Required CGA to stand and transfer with no AD. Tolerated well, asx throughout. Left pt in chair, alarm on. Call bell in hand, all needs met.   Sherrilee Ditty Mobility Specialist Please contact via Special Educational Needs Teacher or  Rehab office at [phone]

## 2023-09-10 ENCOUNTER — Ambulatory Visit: Payer: 59 | Admitting: Internal Medicine

## 2023-09-10 ENCOUNTER — Encounter (HOSPITAL_COMMUNITY): Payer: Self-pay | Admitting: Speech Pathology

## 2023-09-10 LAB — GLUCOSE, CAPILLARY
Glucose-Capillary: 147 mg/dL — ABNORMAL HIGH (ref 70–99)
Glucose-Capillary: 157 mg/dL — ABNORMAL HIGH (ref 70–99)

## 2023-09-10 MED ORDER — HYDRALAZINE HCL 20 MG/ML IJ SOLN: 10.0000 mg | INTRAMUSCULAR | Status: DC | PRN

## 2023-09-10 NOTE — Care Management Important Message (Signed)
 Important Message  Patient Details  Name: Robin [name] MRN: [id number] Date of Birth: 04-09-1966   Important Message Given:  Yes - Medicare IM     Perseus Westall L Kyesha Balla 09/10/2023, 10:58 AM

## 2023-09-10 NOTE — TOC Transition Note (Signed)
 Transition of Care [facility]) - Discharge Note   Patient Details  Name: Robin [name] MRN: [id number] Date of Birth: 1966-01-21  [facility]) CM/SW Contact:  Sharlyne Stabs, RN Phone Number: 09/10/2023, 12:57 PM   Clinical Narrative:   Insurance auth received. Clinicals sent to [facility]. Sister updated, she is on the way to transport patient to [facility]. RN updated.    Final next level of care: Skilled Nursing Facility Barriers to Discharge: Barriers Resolved   Patient Goals and CMS Choice Patient states their goals for this hospitalization and ongoing recovery are:: agreeable to SNF CMS Medicare.gov Compare Post Acute Care list provided to:: Patient Represenative (must comment) Choice offered to / list presented to : [facility] POA / Guardian [place] ownership interest in [facility].provided to::  (n/a)    Discharge Placement               Patient to be transferred to facility by: sister Name of family member notified: Robin [name] - sister Patient and family notified of of transfer: 09/10/23  Discharge Plan and Services Additional resources added to the After Visit Summary for   In-house Referral: Clinical Social Work   Social Drivers of Health (SDOH) Interventions SDOH Screenings   Food Insecurity: No Food Insecurity (09/07/2023)  Housing: Low Risk  (09/07/2023)  Transportation Needs: No Transportation Needs (09/07/2023)  Utilities: Not At Risk (09/07/2023)  Depression (PHQ2-9): Low Risk  (02/05/2023)  Tobacco Use: Medium Risk (09/04/2023)     Readmission Risk Interventions    09/08/2023    9:27 AM  Readmission Risk Prevention Plan  Transportation Screening Complete  HRI or Home Care Consult Complete  Social Work Consult for Recovery Care Planning/Counseling Complete  Palliative Care Screening Not Applicable  Medication Review Oceanographer) Complete

## 2023-09-10 NOTE — Progress Notes (Signed)
 Called report to [place] at [facility].

## 2023-09-10 NOTE — Progress Notes (Signed)
 Patient seen and evaluated this a.m. with no new complaints or concerns noted or acute overnight events.  She is in stable condition for discharge.  Please refer to discharge summary dictated 2/4 for full details.  Total care time: 15 minutes.

## 2023-09-10 NOTE — Progress Notes (Signed)
 Patient rested well. No c/o pain. Patient is able to hold a conversation, but still has some aphasia.  Right side has very limited mobility,especially right arm.

## 2023-09-12 LAB — CULTURE, BLOOD (ROUTINE X 2)
Culture: NO GROWTH
Culture: NO GROWTH

## 2023-09-16 ENCOUNTER — Ambulatory Visit: Payer: 59 | Admitting: Physician Assistant

## 2023-09-18 ENCOUNTER — Other Ambulatory Visit: Payer: Self-pay | Admitting: Cardiology

## 2023-09-22 ENCOUNTER — Telehealth: Payer: Self-pay

## 2023-09-22 ENCOUNTER — Telehealth: Payer: Self-pay | Admitting: *Deleted

## 2023-09-22 NOTE — Telephone Encounter (Signed)
Called Rinaldo Cloud with [facility], verbal orders given for Skilled Nursing: Frequency: 1 x week x's 4 weeks and 1 x a month x's 1 and 2 PRNs. Okay for pt per [facility]. Rinaldo Cloud verbalized understanding.

## 2023-09-22 NOTE — Transitions of Care (Post Inpatient/ED Visit) (Unsigned)
   09/22/2023  Name: Robin [name] MRN: [id number] DOB: 09/22/1965  Today's TOC FU Call Status: Today's TOC FU Call Status:: Unsuccessful Call (1st Attempt) Unsuccessful Call (1st Attempt) Date: 09/22/23  Attempted to reach the patient regarding the most recent Inpatient/ED visit.  Follow Up Plan: Additional outreach attempts will be made to reach the patient to complete the Transitions of Care (Post Inpatient/ED visit) call.   Signature Karena Addison, LPN [facility] Nurse Health Advisor Direct Dial [phone]

## 2023-09-22 NOTE — Telephone Encounter (Signed)
Copied from CRM [id number]. Topic: Clinical - Home Health Verbal Orders >> Sep 22, 2023 11:32 AM Pascal Lux wrote: Caller/Agency: [facility] - [facility]  Callback Number: [phone] Service Requested: Skilled Nursing Frequency: 1 x week 4 weeks and 1 month 1 and 2 PRNs- Any new concerns about the patient? No

## 2023-09-23 ENCOUNTER — Inpatient Hospital Stay (HOSPITAL_COMMUNITY)
Admission: EM | Admit: 2023-09-23 | Discharge: 2023-09-28 | DRG: 871 | Disposition: A | Discharge status: Home Health | Payer: 59 | Attending: Family Medicine | Admitting: Family Medicine

## 2023-09-23 ENCOUNTER — Other Ambulatory Visit: Payer: Self-pay

## 2023-09-23 ENCOUNTER — Emergency Department (HOSPITAL_COMMUNITY): Payer: 59

## 2023-09-23 ENCOUNTER — Other Ambulatory Visit: Payer: Self-pay | Admitting: Physician Assistant

## 2023-09-23 ENCOUNTER — Inpatient Hospital Stay: Payer: 59 | Admitting: Physician Assistant

## 2023-09-23 DIAGNOSIS — N179 Acute kidney failure, unspecified: Secondary | ICD-10-CM | POA: Diagnosis present

## 2023-09-23 DIAGNOSIS — I350 Nonrheumatic aortic (valve) stenosis: Secondary | ICD-10-CM | POA: Diagnosis present

## 2023-09-23 DIAGNOSIS — Z794 Long term (current) use of insulin: Secondary | ICD-10-CM | POA: Diagnosis not present

## 2023-09-23 DIAGNOSIS — I130 Hypertensive heart and chronic kidney disease with heart failure and stage 1 through stage 4 chronic kidney disease, or unspecified chronic kidney disease: Secondary | ICD-10-CM | POA: Diagnosis present

## 2023-09-23 DIAGNOSIS — G9341 Metabolic encephalopathy: Principal | ICD-10-CM | POA: Diagnosis present

## 2023-09-23 DIAGNOSIS — I69320 Aphasia following cerebral infarction: Secondary | ICD-10-CM

## 2023-09-23 DIAGNOSIS — R6520 Severe sepsis without septic shock: Secondary | ICD-10-CM | POA: Diagnosis present

## 2023-09-23 DIAGNOSIS — Z886 Allergy status to analgesic agent status: Secondary | ICD-10-CM

## 2023-09-23 DIAGNOSIS — Z833 Family history of diabetes mellitus: Secondary | ICD-10-CM

## 2023-09-23 DIAGNOSIS — G40909 Epilepsy, unspecified, not intractable, without status epilepticus: Secondary | ICD-10-CM | POA: Diagnosis present

## 2023-09-23 DIAGNOSIS — N183 Chronic kidney disease, stage 3 unspecified: Secondary | ICD-10-CM | POA: Diagnosis not present

## 2023-09-23 DIAGNOSIS — I10 Essential (primary) hypertension: Secondary | ICD-10-CM | POA: Diagnosis not present

## 2023-09-23 DIAGNOSIS — R9431 Abnormal electrocardiogram [ECG] [EKG]: Secondary | ICD-10-CM | POA: Diagnosis present

## 2023-09-23 DIAGNOSIS — Z1152 Encounter for screening for COVID-19: Secondary | ICD-10-CM

## 2023-09-23 DIAGNOSIS — J069 Acute upper respiratory infection, unspecified: Secondary | ICD-10-CM | POA: Diagnosis present

## 2023-09-23 DIAGNOSIS — Z7982 Long term (current) use of aspirin: Secondary | ICD-10-CM

## 2023-09-23 DIAGNOSIS — B9729 Other coronavirus as the cause of diseases classified elsewhere: Secondary | ICD-10-CM | POA: Diagnosis present

## 2023-09-23 DIAGNOSIS — Z7901 Long term (current) use of anticoagulants: Secondary | ICD-10-CM

## 2023-09-23 DIAGNOSIS — E1140 Type 2 diabetes mellitus with diabetic neuropathy, unspecified: Secondary | ICD-10-CM | POA: Diagnosis present

## 2023-09-23 DIAGNOSIS — Z79899 Other long term (current) drug therapy: Secondary | ICD-10-CM

## 2023-09-23 DIAGNOSIS — I69351 Hemiplegia and hemiparesis following cerebral infarction affecting right dominant side: Secondary | ICD-10-CM

## 2023-09-23 DIAGNOSIS — E1122 Type 2 diabetes mellitus with diabetic chronic kidney disease: Secondary | ICD-10-CM | POA: Diagnosis present

## 2023-09-23 DIAGNOSIS — K219 Gastro-esophageal reflux disease without esophagitis: Secondary | ICD-10-CM | POA: Diagnosis present

## 2023-09-23 DIAGNOSIS — Z91030 Bee allergy status: Secondary | ICD-10-CM

## 2023-09-23 DIAGNOSIS — A4189 Other specified sepsis: Secondary | ICD-10-CM | POA: Diagnosis present

## 2023-09-23 DIAGNOSIS — I4821 Permanent atrial fibrillation: Secondary | ICD-10-CM | POA: Diagnosis present

## 2023-09-23 DIAGNOSIS — I482 Chronic atrial fibrillation, unspecified: Secondary | ICD-10-CM

## 2023-09-23 DIAGNOSIS — N1832 Chronic kidney disease, stage 3b: Secondary | ICD-10-CM | POA: Diagnosis present

## 2023-09-23 DIAGNOSIS — K760 Fatty (change of) liver, not elsewhere classified: Secondary | ICD-10-CM | POA: Diagnosis present

## 2023-09-23 DIAGNOSIS — A419 Sepsis, unspecified organism: Secondary | ICD-10-CM | POA: Diagnosis present

## 2023-09-23 DIAGNOSIS — E871 Hypo-osmolality and hyponatremia: Secondary | ICD-10-CM | POA: Diagnosis present

## 2023-09-23 DIAGNOSIS — E1342 Other specified diabetes mellitus with diabetic polyneuropathy: Secondary | ICD-10-CM

## 2023-09-23 DIAGNOSIS — Z811 Family history of alcohol abuse and dependence: Secondary | ICD-10-CM

## 2023-09-23 DIAGNOSIS — E66812 Obesity, class 2: Secondary | ICD-10-CM | POA: Diagnosis present

## 2023-09-23 DIAGNOSIS — J449 Chronic obstructive pulmonary disease, unspecified: Secondary | ICD-10-CM | POA: Diagnosis present

## 2023-09-23 DIAGNOSIS — I5022 Chronic systolic (congestive) heart failure: Secondary | ICD-10-CM | POA: Diagnosis present

## 2023-09-23 DIAGNOSIS — Z880 Allergy status to penicillin: Secondary | ICD-10-CM

## 2023-09-23 DIAGNOSIS — M109 Gout, unspecified: Secondary | ICD-10-CM | POA: Diagnosis present

## 2023-09-23 DIAGNOSIS — E869 Volume depletion, unspecified: Secondary | ICD-10-CM | POA: Diagnosis present

## 2023-09-23 DIAGNOSIS — Z9049 Acquired absence of other specified parts of digestive tract: Secondary | ICD-10-CM

## 2023-09-23 DIAGNOSIS — R471 Dysarthria and anarthria: Secondary | ICD-10-CM | POA: Diagnosis present

## 2023-09-23 DIAGNOSIS — Z6837 Body mass index (BMI) 37.0-37.9, adult: Secondary | ICD-10-CM

## 2023-09-23 DIAGNOSIS — Z90710 Acquired absence of both cervix and uterus: Secondary | ICD-10-CM

## 2023-09-23 DIAGNOSIS — Z87891 Personal history of nicotine dependence: Secondary | ICD-10-CM

## 2023-09-23 DIAGNOSIS — Z7984 Long term (current) use of oral hypoglycemic drugs: Secondary | ICD-10-CM

## 2023-09-23 DIAGNOSIS — Z888 Allergy status to other drugs, medicaments and biological substances status: Secondary | ICD-10-CM

## 2023-09-23 LAB — CBC WITH DIFFERENTIAL/PLATELET
Abs Immature Granulocytes: 0.01 10*3/uL (ref 0.00–0.07)
Basophils Absolute: 0.1 10*3/uL (ref 0.0–0.1)
Basophils Relative: 1 %
Eosinophils Absolute: 0.2 10*3/uL (ref 0.0–0.5)
Eosinophils Relative: 4 %
HCT: 46.5 % — ABNORMAL HIGH (ref 36.0–46.0)
Hemoglobin: 14.6 g/dL (ref 12.0–15.0)
Immature Granulocytes: 0 %
Lymphocytes Relative: 18 %
Lymphs Abs: 1.1 10*3/uL (ref 0.7–4.0)
MCH: 26.8 pg (ref 26.0–34.0)
MCHC: 31.4 g/dL (ref 30.0–36.0)
MCV: 85.3 fL (ref 80.0–100.0)
Monocytes Absolute: 0.5 10*3/uL (ref 0.1–1.0)
Monocytes Relative: 8 %
Neutro Abs: 4.2 10*3/uL (ref 1.7–7.7)
Neutrophils Relative %: 69 %
Platelets: 257 10*3/uL (ref 150–400)
RBC: 5.45 MIL/uL — ABNORMAL HIGH (ref 3.87–5.11)
RDW: 15 % (ref 11.5–15.5)
WBC: 6 10*3/uL (ref 4.0–10.5)
nRBC: 0 % (ref 0.0–0.2)

## 2023-09-23 LAB — LACTIC ACID, PLASMA: Lactic Acid, Venous: 0.9 mmol/L (ref 0.5–1.9)

## 2023-09-23 LAB — COMPREHENSIVE METABOLIC PANEL
ALT: 16 U/L (ref 0–44)
AST: 18 U/L (ref 15–41)
Albumin: 3.8 g/dL (ref 3.5–5.0)
Alkaline Phosphatase: 83 U/L (ref 38–126)
Anion gap: 10 (ref 5–15)
BUN: 23 mg/dL — ABNORMAL HIGH (ref 6–20)
CO2: 19 mmol/L — ABNORMAL LOW (ref 22–32)
Calcium: 8.6 mg/dL — ABNORMAL LOW (ref 8.9–10.3)
Chloride: 105 mmol/L (ref 98–111)
Creatinine, Ser: 1.74 mg/dL — ABNORMAL HIGH (ref 0.44–1.00)
GFR, Estimated: 34 mL/min — ABNORMAL LOW (ref >60)
Glucose, Bld: 117 mg/dL — ABNORMAL HIGH (ref 70–99)
Potassium: 3.9 mmol/L (ref 3.5–5.1)
Sodium: 134 mmol/L — ABNORMAL LOW (ref 135–145)
Total Bilirubin: 1.1 mg/dL (ref 0.0–1.2)
Total Protein: 7.6 g/dL (ref 6.5–8.1)

## 2023-09-23 LAB — URINALYSIS, W/ REFLEX TO CULTURE (INFECTION SUSPECTED)
Bilirubin Urine: NEGATIVE
Glucose, UA: >=500 mg/dL — AB
Hgb urine dipstick: NEGATIVE
Ketones, ur: NEGATIVE mg/dL
Leukocytes,Ua: NEGATIVE
Nitrite: NEGATIVE
Protein, ur: 100 mg/dL — AB
Specific Gravity, Urine: 1.024 (ref 1.005–1.030)
pH: 5 (ref 5.0–8.0)

## 2023-09-23 LAB — PROTIME-INR
INR: 1.5 — ABNORMAL HIGH (ref 0.8–1.2)
Prothrombin Time: 17.9 s — ABNORMAL HIGH (ref 11.4–15.2)

## 2023-09-23 LAB — RESP PANEL BY RT-PCR (RSV, FLU A&B, COVID)  RVPGX2
Influenza A by PCR: NEGATIVE
Influenza B by PCR: NEGATIVE
Resp Syncytial Virus by PCR: NEGATIVE
SARS Coronavirus 2 by RT PCR: NEGATIVE

## 2023-09-23 MED ORDER — SODIUM CHLORIDE 0.9 % IV BOLUS (SEPSIS)
1000.0000 mL | Freq: Once | INTRAVENOUS | Status: AC
  Administered 2023-09-23: 1000 mL via INTRAVENOUS

## 2023-09-23 MED ORDER — METRONIDAZOLE 500 MG/100ML IV SOLN
500.0000 mg | Freq: Once | INTRAVENOUS | Status: AC
  Administered 2023-09-23: 500 mg via INTRAVENOUS
  Filled 2023-09-23: qty 100

## 2023-09-23 MED ORDER — LACTATED RINGERS IV SOLN: INTRAVENOUS | Status: AC

## 2023-09-23 MED ORDER — ACETAMINOPHEN 650 MG RE SUPP
650.0000 mg | Freq: Once | RECTAL | Status: AC
Start: 2023-09-23 — End: 2023-09-23
  Administered 2023-09-23: 650 mg via RECTAL
  Filled 2023-09-23: qty 1

## 2023-09-23 MED ORDER — VANCOMYCIN HCL IN DEXTROSE 1-5 GM/200ML-% IV SOLN
1000.0000 mg | Freq: Once | INTRAVENOUS | Status: AC
  Administered 2023-09-23: 1000 mg via INTRAVENOUS
  Filled 2023-09-23: qty 200

## 2023-09-23 MED ORDER — SODIUM CHLORIDE 0.9 % IV SOLN
2.0000 g | Freq: Once | INTRAVENOUS | Status: AC
  Administered 2023-09-23: 2 g via INTRAVENOUS
  Filled 2023-09-23: qty 12.5

## 2023-09-23 NOTE — Transitions of Care (Post Inpatient/ED Visit) (Signed)
09/23/2023  Name: Robin [name] MRN: [id number] DOB: 1965-11-18  Today's TOC FU Call Status: Today's TOC FU Call Status:: Successful TOC FU Call Completed Unsuccessful Call (1st Attempt) Date: 09/22/23 [facility] FU Call Complete Date: 09/23/23 Patient's Name and Date of Birth confirmed.  Transition Care Management Follow-up Telephone Call Date of Discharge: 09/19/23 Discharge Facility: Other [facility]) Name of Other (Non[facility]) Discharge Facility: [facility] Type of Discharge: Inpatient Admission Primary Inpatient Discharge Diagnosis:: seizure How have you been since you were released from the hospital?: Better Any questions or concerns?: No  Items Reviewed: Did you receive and understand the discharge instructions provided?: Yes Medications obtained,verified, and reconciled?: Yes (Medications Reviewed) Any new allergies since your discharge?: No Do you have support at home?: Yes People in Home: spouse  Medications Reviewed Today: Medications Reviewed Today     Reviewed by Karena Addison, LPN (Licensed Practical Nurse) on 09/23/23 at 1246  Med List Status: <None>   Medication Order Taking? Sig Documenting Provider Last Dose Status Informant  Accu-Chek Softclix Lancets lancets [id number] No Use as instructed Shamleffer, Konrad Dolores, MD Taking Active Pharmacy Records, Family Member  apixaban [facility]) 5 MG TABS tablet [id number] No Take 1 tablet by mouth twice daily Jarold Motto, [place] 09/07/2023 Active Family Member           Med Note (WARD, Illene Labrador   Tue Sep 09, 2023  4:44 PM) 0745  aspirin EC 81 MG tablet [id number] No Take 1 tablet (81 mg total) by mouth daily. Swallow whole. Carlyon Prows 09/07/2023 Active Family Member           Med Note (WARD, ANGELICA G   Tue Sep 09, 2023  4:45 PM) [phone]   Blood Glucose Monitoring Suppl (ACCU-CHEK GUIDE ME) w/Device KIT [id number] No 1 Device by Does not apply route 3 (three) times daily. Shamleffer, Konrad Dolores,  MD Taking Active Pharmacy Records, Family Member  carvedilol (COREG) 25 MG tablet [id number]  TAKE 2 TABLETS BY MOUTH TWICE DAILY WITH A MEAL Branch, Dorothe Pea, MD  Active   Dulaglutide (TRULICITY) 3 MG/0.5ML SOPN [id number] No Inject 3 mg as directed once a week. Shamleffer, Konrad Dolores, MD 09/02/2023 Active Family Member  empagliflozin (JARDIANCE) 25 MG TABS tablet [id number] No Take 1 tablet (25 mg total) by mouth daily before breakfast. Shamleffer, Konrad Dolores, MD 09/07/2023 Active Family Member  ferrous sulfate (FEROSUL) 325 (65 FE) MG tablet [id number] No Take 1 tablet by mouth once daily Jarold Motto, [place] 09/07/2023 Active Family Member  furosemide (LASIX) 20 MG tablet [id number] No TAKE 1 TABLET BY MOUTH ONCE DAILY (MAY TAKE ADDITIONAL 20 MG IF WEIGHT GAIN IS OVER 2LBS OVERNIGHT OR 5 LBS IN A WEEK) Antoine Poche, MD 09/07/2023 Active Family Member  gabapentin (NEURONTIN) 100 MG capsule [id number] No TAKE 2 CAPSULES BY MOUTH THREE TIMES DAILY Jarold Motto, [place] 09/07/2023 Active Family Member  glucose blood (ACCU-CHEK GUIDE) test strip [id number] No USE 1 STRIP TO CHECK GLUCOSE THREE TIMES DAILY Shamleffer, Konrad Dolores, MD Taking Active Family Member  hydrALAZINE (APRESOLINE) 25 MG tablet [id number] No TAKE 1 TABLET BY MOUTH THREE TIMES DAILY Jarold Motto, [place] 09/07/2023 Active Family Member  insulin aspart (NOVOLOG FLEXPEN) 100 UNIT/ML FlexPen [id number] No Inject 12 Units into the skin 3 (three) times daily with meals. Shamleffer, Konrad Dolores, MD 09/07/2023 Active Family Member  insulin glargine (LANTUS SOLOSTAR) 100 UNIT/ML Solostar Pen [id number] No Inject 44 Units into the skin daily. Shamleffer, Konrad Dolores, MD 09/06/2023 Active  Family Member  Insulin Pen Needle (BD PEN NEEDLE NANO U/F) 32G X 4 MM MISC [id number] No Inject 1 Device into the skin in the morning, at noon, in the evening, and at bedtime. Shamleffer, Konrad Dolores, MD Taking Active Family Member  isosorbide mononitrate  (IMDUR) 30 MG 24 hr tablet [id number] No Take 2 tablets by mouth once daily Jarold Motto, [place] 09/07/2023 Active Family Member  levETIRAcetam (KEPPRA) 250 MG tablet [id number]  Take 1 tablet (250 mg total) by mouth 2 (two) times daily. Sherryll Burger, Pratik D, DO  Active   ondansetron (ZOFRAN-ODT) 4 MG disintegrating tablet [id number] No 4mg  ODT q4 hours prn nausea/vomit  Patient taking differently: Take 4 mg by mouth every 8 (eight) hours as needed for vomiting or nausea.   Gilda Crease, MD Past Month Active Family Member  timolol (TIMOPTIC) 0.5 % ophthalmic solution [id number] No Place 1 drop into both eyes 2 (two) times daily. [provider] 09/06/2023 Active Family Member  tobramycin (TOBREX) 0.3 % ophthalmic solution [id number] No 1 drop 4 (four) times daily. [provider] 09/06/2023 Active Family Member            Home Care and Equipment/Supplies: Were Home Health Services Ordered?: NA Any new equipment or medical supplies ordered?: NA  Functional Questionnaire: Do you need assistance with bathing/showering or dressing?: No Do you need assistance with meal preparation?: No Do you need assistance with eating?: No Do you have difficulty maintaining continence: No Do you need assistance with getting out of bed/getting out of a chair/moving?: No Do you have difficulty managing or taking your medications?: Yes  Follow up appointments reviewed: PCP Follow-up appointment confirmed?: Yes Date of PCP follow-up appointment?: 10/06/23 Follow-up Provider: [facility] Follow-up appointment confirmed?: NA Do you need transportation to your follow-up appointment?: No Do you understand care options if your condition(s) worsen?: Yes-patient verbalized understanding    SIGNATURE Karena Addison, LPN [facility] Nurse Health Advisor Direct Dial [phone]

## 2023-09-23 NOTE — Progress Notes (Signed)
 Elink monitoring for the code sepsis protocol.

## 2023-09-23 NOTE — ED Provider Notes (Signed)
[name] [facility] AT [facility] Provider Note   CSN: [id number] Arrival date & time: 09/23/23  2015     History  Chief Complaint  Patient presents with   Altered Mental Status    Robin [name] is a 58 y.o. female.  Pt is a 58 yo female with pmhx significant for htn, gerd, copd, chf, dm2, chronic afib (Eliquis) and cva (with right sided weakness and expressive aphasia).  Pt is unable to give any history.  Pt has been confused all day.  She has had a fever.        Home Medications Prior to Admission medications   Medication Sig Start Date End Date Taking? Authorizing Provider  Accu-Chek Softclix Lancets lancets Use as instructed 06/13/21   Shamleffer, Konrad Dolores, MD  apixaban (ELIQUIS) 5 MG TABS tablet Take 1 tablet by mouth twice daily 09/04/23   Jarold Motto, PA  aspirin EC 81 MG tablet Take 1 tablet (81 mg total) by mouth daily. Swallow whole. 07/19/20   Strader, Lennart Pall, PA-C  Blood Glucose Monitoring Suppl (ACCU-CHEK GUIDE ME) w/Device KIT 1 Device by Does not apply route 3 (three) times daily. 03/09/21   Shamleffer, Konrad Dolores, MD  carvedilol (COREG) 25 MG tablet TAKE 2 TABLETS BY MOUTH TWICE DAILY WITH A MEAL 09/18/23   Antoine Poche, MD  dicyclomine (BENTYL) 20 MG tablet Take 20 mg by mouth 3 (three) times daily before meals. 09/18/23 10/18/23  [provider]  Dulaglutide (TRULICITY) 3 MG/0.5ML SOPN Inject 3 mg as directed once a week. 05/09/23   Shamleffer, Konrad Dolores, MD  empagliflozin (JARDIANCE) 25 MG TABS tablet Take 1 tablet (25 mg total) by mouth daily before breakfast. 05/09/23   Shamleffer, Konrad Dolores, MD  ferrous sulfate (FEROSUL) 325 (65 FE) MG tablet Take 1 tablet by mouth once daily 07/17/23   Jarold Motto, PA  furosemide (LASIX) 20 MG tablet TAKE 1 TABLET BY MOUTH ONCE DAILY (MAY TAKE ADDITIONAL 20 MG IF WEIGHT GAIN IS OVER 2LBS OVERNIGHT OR 5 LBS IN A WEEK) 08/12/22   Antoine Poche, MD   gabapentin (NEURONTIN) 100 MG capsule TAKE 2 CAPSULES BY MOUTH THREE TIMES DAILY 08/14/23   Jarold Motto, PA  glucose blood (ACCU-CHEK GUIDE) test strip USE 1 STRIP TO CHECK GLUCOSE THREE TIMES DAILY 08/28/22   Shamleffer, Konrad Dolores, MD  hydrALAZINE (APRESOLINE) 25 MG tablet TAKE 1 TABLET BY MOUTH THREE TIMES DAILY 09/23/23   Jarold Motto, PA  insulin aspart (NOVOLOG FLEXPEN) 100 UNIT/ML FlexPen Inject 12 Units into the skin 3 (three) times daily with meals. 10/30/22   Shamleffer, Konrad Dolores, MD  insulin glargine (LANTUS SOLOSTAR) 100 UNIT/ML Solostar Pen Inject 44 Units into the skin daily. 10/30/22   Shamleffer, Konrad Dolores, MD  Insulin Pen Needle (BD PEN NEEDLE NANO U/F) 32G X 4 MM MISC Inject 1 Device into the skin in the morning, at noon, in the evening, and at bedtime. 10/30/22   Shamleffer, Konrad Dolores, MD  isosorbide mononitrate (IMDUR) 30 MG 24 hr tablet Take 2 tablets by mouth once daily 09/23/23   Jarold Motto, PA  levETIRAcetam (KEPPRA) 250 MG tablet Take 1 tablet (250 mg total) by mouth 2 (two) times daily. 09/09/23   Sherryll Burger, Pratik D, DO  ondansetron (ZOFRAN-ODT) 4 MG disintegrating tablet 4mg  ODT q4 hours prn nausea/vomit Patient taking differently: Take 4 mg by mouth every 8 (eight) hours as needed for vomiting or nausea. 08/18/23   Gilda Crease, MD  timolol (  TIMOPTIC) 0.5 % ophthalmic solution Place 1 drop into both eyes 2 (two) times daily. 07/05/22   [provider]  tobramycin (TOBREX) 0.3 % ophthalmic solution 1 drop 4 (four) times daily. 04/16/23   [provider]      Allergies    Bee venom, Losartan, Naproxen, Lisinopril, and Penicillins    Review of Systems   Review of Systems  Constitutional:  Positive for fever.  All other systems reviewed and are negative.   Physical Exam Updated Vital Signs BP (!) 141/90   Pulse (!) 108   Temp (!) 102 F (38.9 C) (Rectal)   Resp 15   LMP 08/21/2011   SpO2 98%  Physical  Exam Vitals and nursing note reviewed.  Constitutional:      General: She is in acute distress.     Appearance: She is ill-appearing.  HENT:     Head: Normocephalic and atraumatic.     Right Ear: External ear normal.     Left Ear: External ear normal.     Nose: Nose normal.     Mouth/Throat:     Mouth: Mucous membranes are dry.  Eyes:     Extraocular Movements: Extraocular movements intact.     Conjunctiva/sclera: Conjunctivae normal.     Pupils: Pupils are equal, round, and reactive to light.  Cardiovascular:     Rate and Rhythm: Tachycardia present. Rhythm irregular.     Pulses: Normal pulses.     Heart sounds: Normal heart sounds.  Pulmonary:     Breath sounds: Rhonchi present.  Abdominal:     General: Abdomen is flat. Bowel sounds are normal.     Palpations: Abdomen is soft.  Musculoskeletal:        General: Normal range of motion.     Cervical back: Normal range of motion and neck supple.  Skin:    General: Skin is warm.     Capillary Refill: Capillary refill takes less than 2 seconds.  Neurological:     Mental Status: She is disoriented.  Psychiatric:     Comments: Unable to assess due to ms     ED Results / Procedures / Treatments   Labs (all labs ordered are listed, but only abnormal results are displayed) Labs Reviewed  COMPREHENSIVE METABOLIC PANEL - Abnormal; Notable for the following components:      Result Value   Sodium 134 (*)    CO2 19 (*)    Glucose, Bld 117 (*)    BUN 23 (*)    Creatinine, Ser 1.74 (*)    Calcium 8.6 (*)    GFR, Estimated 34 (*)    All other components within normal limits  CBC WITH DIFFERENTIAL/PLATELET - Abnormal; Notable for the following components:   RBC 5.45 (*)    HCT 46.5 (*)    All other components within normal limits  PROTIME-INR - Abnormal; Notable for the following components:   Prothrombin Time 17.9 (*)    INR 1.5 (*)    All other components within normal limits  URINALYSIS, W/ REFLEX TO CULTURE (INFECTION  SUSPECTED) - Abnormal; Notable for the following components:   Glucose, UA >=500 (*)    Protein, ur 100 (*)    Bacteria, UA RARE (*)    All other components within normal limits  RESP PANEL BY RT-PCR (RSV, FLU A&B, COVID)  RVPGX2  CULTURE, BLOOD (ROUTINE X 2)  CULTURE, BLOOD (ROUTINE X 2)  RESPIRATORY PANEL BY PCR  LACTIC ACID, PLASMA  LACTIC ACID, PLASMA  RAPID URINE DRUG SCREEN, HOSP PERFORMED    EKG None  Radiology CT CHEST ABDOMEN PELVIS WO CONTRAST Result Date: 09/23/2023 CLINICAL DATA:  Fevers with possible sepsis, initial encounter EXAM: CT CHEST, ABDOMEN AND PELVIS WITHOUT CONTRAST TECHNIQUE: Multidetector CT imaging of the chest, abdomen and pelvis was performed following the standard protocol without IV contrast. RADIATION DOSE REDUCTION: This exam was performed according to the departmental dose-optimization program which includes automated exposure control, adjustment of the mA and/or kV according to patient size and/or use of iterative reconstruction technique. COMPARISON:  Chest x-ray from earlier in the same day. CT from 08/18/2023 FINDINGS: CT CHEST FINDINGS Cardiovascular: Somewhat limited due to lack of IV contrast. Atherosclerotic calcifications are noted. Ascending aorta is mildly prominent at 4 cm. Normal tapering is noted in the thoracic aortic arch. Heart is mildly enlarged in size. Mediastinum/Nodes: Thoracic inlet is within normal limits. No hilar or mediastinal adenopathy is noted. The esophagus as visualized is within normal limits. Lungs/Pleura: Lungs are well aerated bilaterally. No focal infiltrate or sizable effusion is seen. No parenchymal nodules are noted. Musculoskeletal: No chest wall mass or suspicious bone lesions identified. CT ABDOMEN PELVIS FINDINGS Hepatobiliary: No focal liver abnormality is seen. Status post cholecystectomy. No biliary dilatation. Pancreas: Unremarkable. No pancreatic ductal dilatation or surrounding inflammatory changes. Spleen: Normal  in size without focal abnormality. Adrenals/Urinary Tract: Adrenal glands are within normal limits. Kidneys are well visualized bilaterally. No renal calculi or obstructive changes are seen. Bladder is decompressed. Stomach/Bowel: No obstructive or inflammatory changes of the colon are seen. Fluid is noted throughout which may be related to a diarrheal state. The appendix is not well visualized and may have been surgically removed. No inflammatory changes are noted. Small bowel and stomach are within normal limits. Vascular/Lymphatic: Aortic atherosclerosis. No enlarged abdominal or pelvic lymph nodes. Reproductive: Status post hysterectomy. No adnexal masses. Other: No abdominal wall hernia or abnormality. No abdominopelvic ascites. Musculoskeletal: No acute or significant osseous findings. IMPRESSION: CT of the chest: Mild prominence of the ascending aorta to 4 cm. Recommend annual imaging followup by CTA or MRA. This recommendation follows 2010 ACCF/AHA/AATS/ACR/ASA/SCA/SCAI/SIR/STS/SVM Guidelines for the Diagnosis and Management of Patients with Thoracic Aortic Disease. Circulation. 2010; 121: Z610-R604. Aortic aneurysm NOS (ICD10-I71.9) CT of the abdomen and pelvis: No acute abnormality noted. Electronically Signed   By: Alcide Clever M.D.   On: 09/23/2023 23:37   DG Chest Port 1 View Result Date: 09/23/2023 CLINICAL DATA:  Sepsis EXAM: PORTABLE CHEST 1 VIEW COMPARISON:  None Available. FINDINGS: Lungs are clear. No pneumothorax or pleural effusion. Stable mild-to-moderate cardiomegaly. Mild central pulmonary vascular congestion without overt pulmonary edema. No acute bone abnormality. IMPRESSION: 1. Stable cardiomegaly with central pulmonary vascular congestion Electronically Signed   By: Helyn Numbers M.D.   On: 09/23/2023 21:57    Procedures Procedures    Medications Ordered in ED Medications  lactated ringers infusion ( Intravenous New Bag/Given 09/23/23 2059)  acetaminophen (TYLENOL)  suppository 650 mg (650 mg Rectal Given 09/23/23 2028)  sodium chloride 0.9 % bolus 1,000 mL (0 mLs Intravenous Stopped 09/23/23 2230)  ceFEPIme (MAXIPIME) 2 g in sodium chloride 0.9 % 100 mL IVPB (0 g Intravenous Stopped 09/23/23 2230)  metroNIDAZOLE (FLAGYL) IVPB 500 mg (0 mg Intravenous Stopped 09/23/23 2330)  vancomycin (VANCOCIN) IVPB 1000 mg/200 mL premix (0 mg Intravenous Stopped 09/23/23 2329)    ED Course/ Medical Decision Making/ A&P  Medical Decision Making Amount and/or Complexity of Data Reviewed Labs: ordered. Radiology: ordered.  Risk OTC drugs. Prescription drug management. Decision regarding hospitalization.   This patient presents to the ED for concern of ams, this involves an extensive number of treatment options, and is a complaint that carries with it a high risk of complications and morbidity.  The differential diagnosis includes sepsis, infection, electrolyte abn   Co morbidities that complicate the patient evaluation  htn, gerd, copd, chf, dm2, and cva (with right sided weakness and expressive aphasia)   Additional history obtained:  Additional history obtained from epic chart review External records from outside source obtained and reviewed including EMS report/family   Lab Tests:  I Ordered, and personally interpreted labs.  The pertinent results include:  cbc nl, cmp with cr 1.74 (stable); lactic nl, inr 1.5; covid/flu/rsv neg; ua neg other than yeast   Imaging Studies ordered:  I ordered imaging studies including cxr, ct chest/abd/pelvis  I independently visualized and interpreted imaging which showed  CXR: Stable cardiomegaly with central pulmonary vascular congestion  CT chest/abd/pelvis: CT of the chest: Mild prominence of the ascending aorta to 4 cm.  Recommend annual imaging followup by CTA or MRA. This recommendation  follows 2010 ACCF/AHA/AATS/ACR/ASA/SCA/SCAI/SIR/STS/SVM Guidelines  for the Diagnosis and  Management of Patients with Thoracic Aortic  Disease. Circulation. 2010; 121: Z610-R604. Aortic aneurysm NOS  (ICD10-I71.9)    CT of the abdomen and pelvis: No acute abnormality noted.   I agree with the radiologist interpretation   Cardiac Monitoring:  The patient was maintained on a cardiac monitor.  I personally viewed and interpreted the cardiac monitored which showed an underlying rhythm of: afib   Medicines ordered and prescription drug management:  I ordered medication including ivfs/maxipime, vanc, flagyl  for sepsis  Reevaluation of the patient after these medicines showed that the patient improved I have reviewed the patients home medicines and have made adjustments as needed   Test Considered:  ct   Critical Interventions:  abx   Consultations Obtained:  I requested consultation with the hospitalist (Dr. Arville Care),  and discussed lab and imaging findings as well as pertinent plan - he will admit   Problem List / ED Course:  Sepsis:  pt is not in septic shock, so she's given only 1L NS.  Pt was given iv maxipime, flagyl, and vancomycin.  Source unclear.  [name] ms has improved with fluids and fever reduction.     Reevaluation:  After the interventions noted above, I reevaluated the patient and found that they have :improved   Social Determinants of Health:  Lives at home   Dispostion:  After consideration of the diagnostic results and the patients response to treatment, I feel that the patent would benefit from admission.    CRITICAL CARE Performed by: Jacalyn Lefevre   Total critical care time: 30 minutes  Critical care time was exclusive of separately billable procedures and treating other patients.  Critical care was necessary to treat or prevent imminent or life-threatening deterioration.  Critical care was time spent personally by me on the following activities: development of treatment plan with patient and/or surrogate as well as nursing,  discussions with consultants, evaluation of patient's response to treatment, examination of patient, obtaining history from patient or surrogate, ordering and performing treatments and interventions, ordering and review of laboratory studies, ordering and review of radiographic studies, pulse oximetry and re-evaluation of patient's condition.         Final Clinical Impression(s) / ED Diagnoses  Final diagnoses:  Sepsis with encephalopathy without septic shock, due to unspecified organism [facility])    Rx / DC Orders ED Discharge Orders     None         Jacalyn Lefevre, MD 09/24/23 0000

## 2023-09-23 NOTE — ED Triage Notes (Signed)
Pt bib [facility] from home, per EMS family has been altered all day with fever, normally A&O. Pt responsive to pain in triage, will not answer questions in triage but EMS states they were told pt was c/o body aches today.   Hx of stroke with right sided weakness.

## 2023-09-23 NOTE — Progress Notes (Signed)
CODE SEPSIS - PHARMACY COMMUNICATION  **Broad Spectrum Antibiotics should be administered within 1 hour of Sepsis diagnosis**  Time Code Sepsis Called/Page Received: 2042  Antibiotics Ordered: cefepime, metronidazole, vanc  Time of 1st antibiotic administration: 2116  Additional action taken by pharmacy: N/A  If necessary, Name of Provider/Nurse Contacted: N/A    Merryl Hacker ,PharmD Clinical Pharmacist  09/23/2023  9:36 PM

## 2023-09-24 ENCOUNTER — Inpatient Hospital Stay (HOSPITAL_COMMUNITY): Payer: 59

## 2023-09-24 DIAGNOSIS — N183 Chronic kidney disease, stage 3 unspecified: Secondary | ICD-10-CM

## 2023-09-24 DIAGNOSIS — I10 Essential (primary) hypertension: Secondary | ICD-10-CM | POA: Diagnosis not present

## 2023-09-24 DIAGNOSIS — A419 Sepsis, unspecified organism: Secondary | ICD-10-CM | POA: Diagnosis not present

## 2023-09-24 DIAGNOSIS — E1342 Other specified diabetes mellitus with diabetic polyneuropathy: Secondary | ICD-10-CM

## 2023-09-24 DIAGNOSIS — E1122 Type 2 diabetes mellitus with diabetic chronic kidney disease: Secondary | ICD-10-CM

## 2023-09-24 DIAGNOSIS — G40909 Epilepsy, unspecified, not intractable, without status epilepticus: Secondary | ICD-10-CM

## 2023-09-24 DIAGNOSIS — I482 Chronic atrial fibrillation, unspecified: Secondary | ICD-10-CM | POA: Diagnosis not present

## 2023-09-24 DIAGNOSIS — E1140 Type 2 diabetes mellitus with diabetic neuropathy, unspecified: Secondary | ICD-10-CM | POA: Insufficient documentation

## 2023-09-24 DIAGNOSIS — Z794 Long term (current) use of insulin: Secondary | ICD-10-CM

## 2023-09-24 LAB — RESPIRATORY PANEL BY PCR
Adenovirus: NOT DETECTED
Bordetella Parapertussis: NOT DETECTED
Bordetella pertussis: NOT DETECTED
Chlamydophila pneumoniae: NOT DETECTED
Coronavirus 229E: NOT DETECTED
Coronavirus HKU1: NOT DETECTED
Coronavirus NL63: DETECTED — AB
Coronavirus OC43: NOT DETECTED
Influenza A: NOT DETECTED
Influenza B: NOT DETECTED
Metapneumovirus: NOT DETECTED
Mycoplasma pneumoniae: NOT DETECTED
Parainfluenza Virus 1: NOT DETECTED
Parainfluenza Virus 2: NOT DETECTED
Parainfluenza Virus 3: NOT DETECTED
Parainfluenza Virus 4: NOT DETECTED
Respiratory Syncytial Virus: NOT DETECTED
Rhinovirus / Enterovirus: NOT DETECTED

## 2023-09-24 LAB — CBC
HCT: 46.7 % — ABNORMAL HIGH (ref 36.0–46.0)
Hemoglobin: 14.6 g/dL (ref 12.0–15.0)
MCH: 26.9 pg (ref 26.0–34.0)
MCHC: 31.3 g/dL (ref 30.0–36.0)
MCV: 86.2 fL (ref 80.0–100.0)
Platelets: 238 10*3/uL (ref 150–400)
RBC: 5.42 MIL/uL — ABNORMAL HIGH (ref 3.87–5.11)
RDW: 14.8 % (ref 11.5–15.5)
WBC: 6.5 10*3/uL (ref 4.0–10.5)
nRBC: 0 % (ref 0.0–0.2)

## 2023-09-24 LAB — BASIC METABOLIC PANEL
Anion gap: 12 (ref 5–15)
BUN: 22 mg/dL — ABNORMAL HIGH (ref 6–20)
CO2: 18 mmol/L — ABNORMAL LOW (ref 22–32)
Calcium: 8.6 mg/dL — ABNORMAL LOW (ref 8.9–10.3)
Chloride: 107 mmol/L (ref 98–111)
Creatinine, Ser: 1.64 mg/dL — ABNORMAL HIGH (ref 0.44–1.00)
GFR, Estimated: 36 mL/min — ABNORMAL LOW (ref >60)
Glucose, Bld: 129 mg/dL — ABNORMAL HIGH (ref 70–99)
Potassium: 4 mmol/L (ref 3.5–5.1)
Sodium: 137 mmol/L (ref 135–145)

## 2023-09-24 LAB — PROCALCITONIN: Procalcitonin: <0.1 ng/mL

## 2023-09-24 LAB — TSH: TSH: 0.541 u[IU]/mL (ref 0.350–4.500)

## 2023-09-24 LAB — RAPID URINE DRUG SCREEN, HOSP PERFORMED
Amphetamines: NOT DETECTED
Barbiturates: NOT DETECTED
Benzodiazepines: NOT DETECTED
Cocaine: NOT DETECTED
Opiates: NOT DETECTED
Tetrahydrocannabinol: NOT DETECTED

## 2023-09-24 LAB — PROTIME-INR
INR: 1.4 — ABNORMAL HIGH (ref 0.8–1.2)
Prothrombin Time: 17.8 s — ABNORMAL HIGH (ref 11.4–15.2)

## 2023-09-24 LAB — HIV ANTIBODY (ROUTINE TESTING W REFLEX): HIV Screen 4th Generation wRfx: NONREACTIVE

## 2023-09-24 LAB — CORTISOL-AM, BLOOD: Cortisol - AM: 15.5 ug/dL (ref 6.7–22.6)

## 2023-09-24 LAB — CBG MONITORING, ED: Glucose-Capillary: 93 mg/dL (ref 70–99)

## 2023-09-24 LAB — AMMONIA: Ammonia: 20 umol/L (ref 9–35)

## 2023-09-24 LAB — LACTIC ACID, PLASMA: Lactic Acid, Venous: 1.1 mmol/L (ref 0.5–1.9)

## 2023-09-24 LAB — CK: Total CK: 52 U/L (ref 38–234)

## 2023-09-24 LAB — VITAMIN B12: Vitamin B-12: 285 pg/mL (ref 180–914)

## 2023-09-24 MED ORDER — TIMOLOL MALEATE 0.5 % OP SOLN
1.0000 [drp] | Freq: Two times a day (BID) | OPHTHALMIC | Status: DC
  Administered 2023-09-24 – 2023-09-28 (×9): 1 [drp] via OPHTHALMIC
  Filled 2023-09-24 (×2): qty 5

## 2023-09-24 MED ORDER — ACETAMINOPHEN 650 MG RE SUPP: 650.0000 mg | Freq: Four times a day (QID) | RECTAL | Status: DC | PRN

## 2023-09-24 MED ORDER — EMPAGLIFLOZIN 25 MG PO TABS
25.0000 mg | ORAL_TABLET | Freq: Every day | ORAL | Status: DC
  Administered 2023-09-24 – 2023-09-28 (×5): 25 mg via ORAL
  Filled 2023-09-24 (×7): qty 1

## 2023-09-24 MED ORDER — FERROUS SULFATE 325 (65 FE) MG PO TABS
325.0000 mg | ORAL_TABLET | Freq: Every day | ORAL | Status: DC
  Administered 2023-09-24 – 2023-09-28 (×5): 325 mg via ORAL
  Filled 2023-09-24 (×5): qty 1

## 2023-09-24 MED ORDER — LEVETIRACETAM 250 MG PO TABS
250.0000 mg | ORAL_TABLET | Freq: Two times a day (BID) | ORAL | Status: DC
  Administered 2023-09-24 – 2023-09-28 (×9): 250 mg via ORAL
  Filled 2023-09-24 (×9): qty 1

## 2023-09-24 MED ORDER — METRONIDAZOLE 500 MG/100ML IV SOLN
500.0000 mg | Freq: Two times a day (BID) | INTRAVENOUS | Status: DC
  Administered 2023-09-24: 500 mg via INTRAVENOUS
  Filled 2023-09-24: qty 100

## 2023-09-24 MED ORDER — ISOSORBIDE MONONITRATE ER 60 MG PO TB24
60.0000 mg | ORAL_TABLET | Freq: Every day | ORAL | Status: DC
  Administered 2023-09-24 – 2023-09-28 (×5): 60 mg via ORAL
  Filled 2023-09-24 (×5): qty 1

## 2023-09-24 MED ORDER — LACTATED RINGERS IV SOLN
150.0000 mL/h | INTRAVENOUS | Status: AC
  Administered 2023-09-24: 150 mL/h via INTRAVENOUS

## 2023-09-24 MED ORDER — METOCLOPRAMIDE HCL 5 MG/ML IJ SOLN: 5.0000 mg | Freq: Four times a day (QID) | INTRAMUSCULAR | Status: DC | PRN

## 2023-09-24 MED ORDER — SODIUM CHLORIDE 0.9 % IV SOLN
2.0000 g | Freq: Two times a day (BID) | INTRAVENOUS | Status: DC
  Administered 2023-09-24: 2 g via INTRAVENOUS
  Filled 2023-09-24: qty 12.5

## 2023-09-24 MED ORDER — HYDRALAZINE HCL 25 MG PO TABS
25.0000 mg | ORAL_TABLET | Freq: Three times a day (TID) | ORAL | Status: DC
  Administered 2023-09-24 – 2023-09-28 (×13): 25 mg via ORAL
  Filled 2023-09-24 (×13): qty 1

## 2023-09-24 MED ORDER — DICYCLOMINE HCL 10 MG PO CAPS
20.0000 mg | ORAL_CAPSULE | Freq: Three times a day (TID) | ORAL | Status: DC
  Administered 2023-09-24 – 2023-09-28 (×13): 20 mg via ORAL
  Filled 2023-09-24 (×13): qty 2

## 2023-09-24 MED ORDER — SODIUM CHLORIDE 0.9 % IV SOLN: 2.0000 g | Freq: Once | INTRAVENOUS | Status: DC

## 2023-09-24 MED ORDER — CARVEDILOL 12.5 MG PO TABS
25.0000 mg | ORAL_TABLET | Freq: Two times a day (BID) | ORAL | Status: DC
  Administered 2023-09-24 – 2023-09-28 (×9): 25 mg via ORAL
  Filled 2023-09-24 (×9): qty 2

## 2023-09-24 MED ORDER — LABETALOL HCL 5 MG/ML IV SOLN: 10.0000 mg | INTRAVENOUS | Status: DC | PRN

## 2023-09-24 MED ORDER — GABAPENTIN 100 MG PO CAPS
200.0000 mg | ORAL_CAPSULE | Freq: Three times a day (TID) | ORAL | Status: DC
  Administered 2023-09-24 – 2023-09-28 (×13): 200 mg via ORAL
  Filled 2023-09-24 (×13): qty 2

## 2023-09-24 MED ORDER — TRAZODONE HCL 50 MG PO TABS: 25.0000 mg | ORAL_TABLET | Freq: Every evening | ORAL | Status: DC | PRN

## 2023-09-24 MED ORDER — INSULIN GLARGINE-YFGN 100 UNIT/ML ~~LOC~~ SOLN
44.0000 [IU] | Freq: Every day | SUBCUTANEOUS | Status: DC
  Administered 2023-09-25 – 2023-09-28 (×4): 44 [IU] via SUBCUTANEOUS
  Filled 2023-09-24 (×6): qty 0.44

## 2023-09-24 MED ORDER — MAGNESIUM HYDROXIDE 400 MG/5ML PO SUSP: 30.0000 mL | Freq: Every day | ORAL | Status: DC | PRN

## 2023-09-24 MED ORDER — FUROSEMIDE 20 MG PO TABS
20.0000 mg | ORAL_TABLET | Freq: Every day | ORAL | Status: DC
  Administered 2023-09-24 – 2023-09-28 (×5): 20 mg via ORAL
  Filled 2023-09-24 (×5): qty 1

## 2023-09-24 MED ORDER — HYDRALAZINE HCL 20 MG/ML IJ SOLN: 10.0000 mg | Freq: Four times a day (QID) | INTRAMUSCULAR | Status: DC | PRN

## 2023-09-24 MED ORDER — VANCOMYCIN HCL 750 MG/150ML IV SOLN
750.0000 mg | INTRAVENOUS | Status: DC
  Administered 2023-09-24: 750 mg via INTRAVENOUS
  Filled 2023-09-24 (×2): qty 150

## 2023-09-24 MED ORDER — VANCOMYCIN HCL IN DEXTROSE 1-5 GM/200ML-% IV SOLN: 1000.0000 mg | Freq: Once | INTRAVENOUS | Status: DC

## 2023-09-24 MED ORDER — APIXABAN 5 MG PO TABS
5.0000 mg | ORAL_TABLET | Freq: Two times a day (BID) | ORAL | Status: DC
  Administered 2023-09-24 – 2023-09-28 (×9): 5 mg via ORAL
  Filled 2023-09-24 (×5): qty 1
  Filled 2023-09-24: qty 2
  Filled 2023-09-24 (×3): qty 1

## 2023-09-24 MED ORDER — TOBRAMYCIN 0.3 % OP SOLN
1.0000 [drp] | Freq: Four times a day (QID) | OPHTHALMIC | Status: DC
  Administered 2023-09-24 – 2023-09-28 (×16): 1 [drp] via OPHTHALMIC
  Filled 2023-09-24 (×2): qty 5

## 2023-09-24 MED ORDER — ACETAMINOPHEN 325 MG PO TABS
650.0000 mg | ORAL_TABLET | Freq: Four times a day (QID) | ORAL | Status: DC | PRN
  Administered 2023-09-25 – 2023-09-26 (×3): 650 mg via ORAL
  Filled 2023-09-24 (×3): qty 2

## 2023-09-24 NOTE — ED Notes (Signed)
Update given to pt's sister.  

## 2023-09-24 NOTE — Assessment & Plan Note (Signed)
-   We will continue antihypertensive therapy.

## 2023-09-24 NOTE — Assessment & Plan Note (Signed)
 -  We will continue Neurontin. ?

## 2023-09-24 NOTE — H&P (Signed)
[place]   PATIENT NAME: Robin [name]    MR#:  [id number]  DATE OF BIRTH:  [birth year]  DATE OF ADMISSION:  09/23/2023  PRIMARY CARE PHYSICIAN: Jarold Motto, PA   Patient is coming from: Home  REQUESTING/REFERRING PHYSICIAN: Jacalyn Lefevre, MD  CHIEF COMPLAINT:   Chief Complaint  Patient presents with   Altered Mental Status    HISTORY OF PRESENT ILLNESS:  Robin [name] is a 58 y.o. African-American female with medical history significant for chronic HFrEF, aortic stenosis, COPD, type 2 diabetes mellitus, chronic atrial fibrillation on Eliquis, essential hypertension, GERD, gout, and CVA, who presented to the ER with acute onset of altered mental status with reported fever with recent confusion and diminished appetite.  She did not have any reported nausea or vomiting or diarrhea or abdominal pain.  No chest pain or palpitations.  No reported cough or wheezing.  She was having a headache and mild neck pain.  No dysuria, oliguria or hematuria or flank pain.  The patient was very somnolent and therefore a poor historian.  ED Course: When the patient came to the ER, temperature was 102 and heart rate 120 with respiratory rate of 11 and BP 143/98 and pulse currently 96% on room air.  Labs revealed mild hyponatremia and CO2 of 19 with a BUN of 23 and creatinine 1.74 calcium 8.6 with previous BUN and creatinine of 20/1.63.  CBC showed mild hemoconcentration.  Urinalysis showed more than 500 glucose and was otherwise unremarkable.    EKG as reviewed by me : EKG showed atrial fibrillation with rapid ventricular sponsor 168 with slightly poor R wave progression and prolonged QT interval with QTc of 488 MS. Imaging: Portable x-ray showed stable cardiomegaly with central pulmonary vascular congestion.  The patient was given 650 mg p.o. Tylenol and 2 g of IV cefepime with 500 mg of IV Flagyl and IV vancomycin as well as 1 L bolus of IV normal saline.  She will be admitted to a  medical telemetry bed for further evaluation and management. PAST MEDICAL HISTORY:   Past Medical History:  Diagnosis Date   Allergy    Anemia    Aortic stenosis    a. 02/2023 Echo: mild-mod AS.   Arthritis    Chronic abdominal pain    Chronic HFrEF (heart failure with reduced ejection fraction) (HCC)    a. EF at 30-35% by echo in 08/2018 b. EF at 35% by repeat echo in 05/2019 c. EF improved to 65-70% in 2021 d. EF at 40-45% in 08/2021; e. 02/2023 Echo: 40-45%, glob HK, mod LVH, nl RV size/fx, sev BAE, mild-mod AS.   Cocaine abuse (HCC)    COPD (chronic obstructive pulmonary disease) (HCC)    Diabetes mellitus without complication (HCC)    Essential hypertension, benign    Expressive aphasia    post CVA   Fatty liver    GERD (gastroesophageal reflux disease)    Gout 2016   Normal coronary arteries    a. 10/2008 abnl MV; b. 10/2008 Cath: nl cors.   Ovarian cyst    Permanent atrial fibrillation (HCC)    Stroke (HCC) 12/26/2019   Right sided weakness, and expressive aphasia   Thoracic ascending aortic aneurysm (HCC)    a. 4 cm 10/31/19 CTA; b. 02/2023 Echo: Nl Ao root.   Type 2 diabetes mellitus (HCC)    type II    PAST SURGICAL HISTORY:   Past Surgical History:  Procedure Laterality Date  ABDOMINAL HYSTERECTOMY  09/10/2011   Procedure: HYSTERECTOMY ABDOMINAL;  Surgeon: Tilda Burrow, MD;  Location: [facility];  Service: Gynecology;  Laterality: N/A;  Abdominal hysterectomy   CESAREAN SECTION  [phone], and 1994   CHOLECYSTECTOMY  1995   IR 3D INDEPENDENT WKST  03/16/2020   IR ANGIO INTRA EXTRACRAN SEL INTERNAL CAROTID BILAT MOD SED  03/16/2020   IR ANGIO VERTEBRAL SEL SUBCLAVIAN INNOMINATE UNI L MOD SED  03/16/2020   IR ANGIO VERTEBRAL SEL VERTEBRAL UNI R MOD SED  03/16/2020   IR CT HEAD LTD  12/27/2019   IR PERCUTANEOUS ART THROMBECTOMY/INFUSION INTRACRANIAL INC DIAG ANGIO  12/27/2019   IR US GUIDE VASC ACCESS RIGHT  12/27/2019   IR US GUIDE VASC ACCESS RIGHT  03/16/2020   RADIOLOGY  WITH ANESTHESIA N/A 12/27/2019   Procedure: IR WITH ANESTHESIA;  Surgeon: Julieanne Cotton, MD;  Location: [facility];  Service: Radiology;  Laterality: N/A;   SCAR REVISION  09/10/2011   Procedure: SCAR REVISION;  Surgeon: Tilda Burrow, MD;  Location: [facility];  Service: Gynecology;  Laterality: N/A;  Wide Excision of old Cicatrix   TUBAL LIGATION  1994    SOCIAL HISTORY:   Social History   Tobacco Use   Smoking status: Former    Current packs/day: 0.00    Types: Cigarettes    Start date: 12/1990    Quit date: 12/2019    Years since quitting: 3.8   Smokeless tobacco: Never  Substance Use Topics   Alcohol use: Not Currently    Comment: occ    FAMILY HISTORY:   Family History  Problem Relation Age of Onset   Cirrhosis Mother    Early death Mother    Alcohol abuse Mother    Diabetes type II Father    Alcohol abuse Father    Diabetes type II Sister    Early death Brother    Alcohol abuse Son    Anesthesia problems Neg Hx    Hypotension Neg Hx    Malignant hyperthermia Neg Hx    Pseudochol deficiency Neg Hx    Colon cancer Neg Hx    Colon polyps Neg Hx    Esophageal cancer Neg Hx    Rectal cancer Neg Hx    Stomach cancer Neg Hx     DRUG ALLERGIES:   Allergies  Allergen Reactions   Bee Venom Shortness Of Breath and Swelling    Bodily Swelling   Losartan Other (See Comments)    Nosebleeds   Naproxen Other (See Comments)    Acid reflux   Lisinopril Other (See Comments)    Sinus congestion   Penicillins Nausea Only    REVIEW OF SYSTEMS:   ROS As per history of present illness. All pertinent systems were reviewed above. Constitutional, HEENT, cardiovascular, respiratory, GI, GU, musculoskeletal, neuro, psychiatric, endocrine, integumentary and hematologic systems were reviewed and are otherwise negative/unremarkable except for positive findings mentioned above in the HPI.   MEDICATIONS AT HOME:   Prior to Admission medications   Medication Sig Start Date End  Date Taking? Authorizing Provider  Accu-Chek Softclix Lancets lancets Use as instructed 06/13/21   Shamleffer, Konrad Dolores, MD  apixaban (ELIQUIS) 5 MG TABS tablet Take 1 tablet by mouth twice daily 09/04/23   Jarold Motto, PA  aspirin EC 81 MG tablet Take 1 tablet (81 mg total) by mouth daily. Swallow whole. 07/19/20   Strader, Lennart Pall, PA-C  Blood Glucose Monitoring Suppl (ACCU-CHEK GUIDE ME) w/Device KIT 1 Device by Does  not apply route 3 (three) times daily. 03/09/21   Shamleffer, Konrad Dolores, MD  carvedilol (COREG) 25 MG tablet TAKE 2 TABLETS BY MOUTH TWICE DAILY WITH A MEAL 09/18/23   Antoine Poche, MD  dicyclomine (BENTYL) 20 MG tablet Take 20 mg by mouth 3 (three) times daily before meals. 09/18/23 10/18/23  [provider]  Dulaglutide (TRULICITY) 3 MG/0.5ML SOPN Inject 3 mg as directed once a week. 05/09/23   Shamleffer, Konrad Dolores, MD  empagliflozin (JARDIANCE) 25 MG TABS tablet Take 1 tablet (25 mg total) by mouth daily before breakfast. 05/09/23   Shamleffer, Konrad Dolores, MD  ferrous sulfate (FEROSUL) 325 (65 FE) MG tablet Take 1 tablet by mouth once daily 07/17/23   Jarold Motto, PA  furosemide (LASIX) 20 MG tablet TAKE 1 TABLET BY MOUTH ONCE DAILY (MAY TAKE ADDITIONAL 20 MG IF WEIGHT GAIN IS OVER 2LBS OVERNIGHT OR 5 LBS IN A WEEK) 08/12/22   Antoine Poche, MD  gabapentin (NEURONTIN) 100 MG capsule TAKE 2 CAPSULES BY MOUTH THREE TIMES DAILY 08/14/23   Jarold Motto, PA  glucose blood (ACCU-CHEK GUIDE) test strip USE 1 STRIP TO CHECK GLUCOSE THREE TIMES DAILY 08/28/22   Shamleffer, Konrad Dolores, MD  hydrALAZINE (APRESOLINE) 25 MG tablet TAKE 1 TABLET BY MOUTH THREE TIMES DAILY 09/23/23   Jarold Motto, PA  insulin aspart (NOVOLOG FLEXPEN) 100 UNIT/ML FlexPen Inject 12 Units into the skin 3 (three) times daily with meals. 10/30/22   Shamleffer, Konrad Dolores, MD  insulin glargine (LANTUS SOLOSTAR) 100 UNIT/ML Solostar Pen Inject 44 Units into the  skin daily. 10/30/22   Shamleffer, Konrad Dolores, MD  Insulin Pen Needle (BD PEN NEEDLE NANO U/F) 32G X 4 MM MISC Inject 1 Device into the skin in the morning, at noon, in the evening, and at bedtime. 10/30/22   Shamleffer, Konrad Dolores, MD  isosorbide mononitrate (IMDUR) 30 MG 24 hr tablet Take 2 tablets by mouth once daily 09/23/23   Jarold Motto, PA  levETIRAcetam (KEPPRA) 250 MG tablet Take 1 tablet (250 mg total) by mouth 2 (two) times daily. 09/09/23   Sherryll Burger, Pratik D, DO  ondansetron (ZOFRAN-ODT) 4 MG disintegrating tablet 4mg  ODT q4 hours prn nausea/vomit Patient taking differently: Take 4 mg by mouth every 8 (eight) hours as needed for vomiting or nausea. 08/18/23   Gilda Crease, MD  timolol (TIMOPTIC) 0.5 % ophthalmic solution Place 1 drop into both eyes 2 (two) times daily. 07/05/22   [provider]  tobramycin (TOBREX) 0.3 % ophthalmic solution 1 drop 4 (four) times daily. 04/16/23   [provider]      VITAL SIGNS:  Blood pressure 131/85, pulse (!) 104, temperature 98.6 F (37 C), temperature source Oral, resp. rate 18, height 5\' 7"  (1.702 m), weight 108.9 kg, last menstrual period 08/21/2011, SpO2 96%.  PHYSICAL EXAMINATION:  Physical Exam  GENERAL:  58 y.o.-year-old patient lying in the bed with no acute distress. The patient was fairly somnolent but arousable.  She would easily fall asleep.  She would occasionally follow commands though. EYES: Pupils equal, round, reactive to light and accommodation. No scleral icterus. Extraocular muscles intact.  HEENT: Head atraumatic, normocephalic. Oropharynx and nasopharynx clear.  NECK:  Supple, no jugular venous distention. No thyroid enlargement, no tenderness.  LUNGS: Normal breath sounds bilaterally, no wheezing, rales,rhonchi or crepitation. No use of accessory muscles of respiration.  CARDIOVASCULAR: Regular rate and rhythm, S1, S2 normal. No murmurs, rubs, or gallops.  ABDOMEN: Soft, nondistended,  nontender. Bowel  sounds present. No organomegaly or mass.  EXTREMITIES: No pedal edema, cyanosis, or clubbing.  NEUROLOGIC: Cranial nerves II through XII are intact. Muscle strength 5/5 in all extremities.  No lateralizing signs.  Sensation intact. Gait not checked.    PSYCHIATRIC: The patient is alert and oriented x 3.  Normal affect and good eye contact. SKIN: No obvious rash, lesion, or ulcer.   LABORATORY PANEL:   CBC Recent Labs  Lab 09/23/23 2105  WBC 6.0  HGB 14.6  HCT 46.5*  PLT 257   ------------------------------------------------------------------------------------------------------------------  Chemistries  Recent Labs  Lab 09/23/23 2105  NA 134*  K 3.9  CL 105  CO2 19*  GLUCOSE 117*  BUN 23*  CREATININE 1.74*  CALCIUM 8.6*  AST 18  ALT 16  ALKPHOS 83  BILITOT 1.1   ------------------------------------------------------------------------------------------------------------------  Cardiac Enzymes No results for input(s): "TROPONINI" in the last 168 hours. ------------------------------------------------------------------------------------------------------------------  RADIOLOGY:  CT CHEST ABDOMEN PELVIS WO CONTRAST Result Date: 09/23/2023 CLINICAL DATA:  Fevers with possible sepsis, initial encounter EXAM: CT CHEST, ABDOMEN AND PELVIS WITHOUT CONTRAST TECHNIQUE: Multidetector CT imaging of the chest, abdomen and pelvis was performed following the standard protocol without IV contrast. RADIATION DOSE REDUCTION: This exam was performed according to the departmental dose-optimization program which includes automated exposure control, adjustment of the mA and/or kV according to patient size and/or use of iterative reconstruction technique. COMPARISON:  Chest x-ray from earlier in the same day. CT from 08/18/2023 FINDINGS: CT CHEST FINDINGS Cardiovascular: Somewhat limited due to lack of IV contrast. Atherosclerotic calcifications are noted. Ascending aorta is  mildly prominent at 4 cm. Normal tapering is noted in the thoracic aortic arch. Heart is mildly enlarged in size. Mediastinum/Nodes: Thoracic inlet is within normal limits. No hilar or mediastinal adenopathy is noted. The esophagus as visualized is within normal limits. Lungs/Pleura: Lungs are well aerated bilaterally. No focal infiltrate or sizable effusion is seen. No parenchymal nodules are noted. Musculoskeletal: No chest wall mass or suspicious bone lesions identified. CT ABDOMEN PELVIS FINDINGS Hepatobiliary: No focal liver abnormality is seen. Status post cholecystectomy. No biliary dilatation. Pancreas: Unremarkable. No pancreatic ductal dilatation or surrounding inflammatory changes. Spleen: Normal in size without focal abnormality. Adrenals/Urinary Tract: Adrenal glands are within normal limits. Kidneys are well visualized bilaterally. No renal calculi or obstructive changes are seen. Bladder is decompressed. Stomach/Bowel: No obstructive or inflammatory changes of the colon are seen. Fluid is noted throughout which may be related to a diarrheal state. The appendix is not well visualized and may have been surgically removed. No inflammatory changes are noted. Small bowel and stomach are within normal limits. Vascular/Lymphatic: Aortic atherosclerosis. No enlarged abdominal or pelvic lymph nodes. Reproductive: Status post hysterectomy. No adnexal masses. Other: No abdominal wall hernia or abnormality. No abdominopelvic ascites. Musculoskeletal: No acute or significant osseous findings. IMPRESSION: CT of the chest: Mild prominence of the ascending aorta to 4 cm. Recommend annual imaging followup by CTA or MRA. This recommendation follows 2010 ACCF/AHA/AATS/ACR/ASA/SCA/SCAI/SIR/STS/SVM Guidelines for the Diagnosis and Management of Patients with Thoracic Aortic Disease. Circulation. 2010; 121: W119-J478. Aortic aneurysm NOS (ICD10-I71.9) CT of the abdomen and pelvis: No acute abnormality noted. Electronically  Signed   By: Alcide Clever M.D.   On: 09/23/2023 23:37   DG Chest Port 1 View Result Date: 09/23/2023 CLINICAL DATA:  Sepsis EXAM: PORTABLE CHEST 1 VIEW COMPARISON:  None Available. FINDINGS: Lungs are clear. No pneumothorax or pleural effusion. Stable mild-to-moderate cardiomegaly. Mild central pulmonary vascular congestion without overt pulmonary edema. No acute  bone abnormality. IMPRESSION: 1. Stable cardiomegaly with central pulmonary vascular congestion Electronically Signed   By: Helyn Numbers M.D.   On: 09/23/2023 21:57      IMPRESSION AND PLAN:  Assessment and Plan: * Sepsis due to undetermined organism (HCC) - This is SIRS with unclear source of infection. - This is likely the culprit for her acute metabolic cephalopathy. - Will need to rule out bacteremia. - The patient will be admitted to a medical telemetry bed. - Will continue broad-spectrum antibiotic therapy with IV cefepime, vancomycin and Flagyl. - We will continue hydration with IV lactated ringer. - We will follow blood cultures. - She had negative meningeal signs and current antibiotic would cover meningitis. - Respiratory 20 pathogens panel was sent. - Initial respiratory panel came back negative for COVID-19, influenza and RSV.  Chronic atrial fibrillation with RVR (HCC) - We will continue Coreg. - We will utilize IV Cardizem as needed. - We will continue Eliquis. - This is partly secondary to volume depletion and SIRS.  Type 2 diabetes mellitus with chronic kidney disease, with long-term current use of insulin (HCC) - This is associated with stage IIIb chronic kidney disease that is currently stable. - The patient will be placed on supplemental coverage with NovoLog. - We will continue basal coverage. - We will continue Jardiance.  Seizure disorder (HCC) - We will continue Keppra.  Diabetic neuropathy (HCC) - We will continue Neurontin.  Essential hypertension - We will continue antihypertensive  therapy.   DVT prophylaxis: Lovenox.  Advanced Care Planning:  Code Status: full code.  Family Communication:  The plan of care was discussed in details with the patient (and family). I answered all questions. The patient agreed to proceed with the above mentioned plan. Further management will depend upon hospital course. Disposition Plan: Back to previous home environment Consults called: none.  All the records are reviewed and case discussed with ED provider.  Status is: Inpatient  At the time of the admission, it appears that the appropriate admission status for this patient is inpatient.  This is judged to be reasonable and necessary in order to provide the required intensity of service to ensure the patient's safety given the presenting symptoms, physical exam findings and initial radiographic and laboratory data in the context of comorbid conditions.  The patient requires inpatient status due to high intensity of service, high risk of further deterioration and high frequency of surveillance required.  I certify that at the time of admission, it is my clinical judgment that the patient will require inpatient hospital care extending more than 2 midnights.                            Dispo: The patient is from: Home              Anticipated d/c is to: Home              Patient currently is not medically stable to d/c.              Difficult to place patient: No  Hannah Beat M.D on 09/24/2023 at 3:26 AM  [facility]   From 7 PM-7 AM, contact night-coverage [email or web address]  CC: Primary care physician; Jarold Motto, [place]

## 2023-09-24 NOTE — Progress Notes (Signed)
PROGRESS NOTE    Robin [name]  WUJ:[id number] DOB: 11/25/1965 DOA: 09/23/2023 PCP: Jarold Motto, PA  Chief Complaint  Patient presents with   Altered Mental Status    Hospital Course:  Robin [name] is 58 y.o. female with heart failure with reduced EF, aortic stenosis, COPD, type 2 diabetes, chronic atrial fibrillation on Eliquis, hypertension, GERD, gout, history of CVA, who presented to the ED with altered mental status and fever.  Patient also endorsed recent confusion and diminished appetite, without GI upset.  In the ED patient met SIRS criteria, febrile to 102, tachycardic to 120.  Labs revealed mild hyponatremia and creatinine 1.74.  Urinalysis is glucose but otherwise unremarkable.  EKG revealed A-fib with RVR.  Chest x-ray with vascular congestion.  Patient was given Tylenol, cefepime, Flagyl, vancomycin and 1 L NS bolus.  She was admitted for medical telemetry. Note patient was recently admitted for similar presentation and during that admission was found to have new focal seizures.  She was evaluated by neurology and started on Keppra 250 twice daily.  She was discharged directly to skilled nursing facility.  It is unclear if she has been taking Keppra as prescribed.  Subjective: On evaluation today patient is unable to participate in exam.  She does not answer questions despite prompting.  She is awake and alert but does not follow commands   Objective: Vitals:   09/24/23 0635 09/24/23 0645 09/24/23 0700 09/24/23 0715  BP:  (!) 130/100 (!) 143/94 (!) 137/100  Pulse: 98 99 (!) 103 (!) 109  Resp:  20 19   Temp: 98.2 F (36.8 C)     TempSrc: Oral     SpO2:  97% 97% 97%  Weight:      Height:       No intake or output data in the 24 hours ending 09/24/23 0841 Filed Weights   09/24/23 0100  Weight: 108.9 kg    Examination: General exam: Appears calm and comfortable, occasional groaning Respiratory system: No work of breathing, symmetric chest wall  expansion Cardiovascular system: S1 & S2 heard, RRR.  Gastrointestinal system: Abdomen is nondistended, soft and nontender.  Neuro: Alert, disoriented, does not answer questions, requires significant prompting to keep tracking.  Does not participate in exam Extremities: Symmetric, expected ROM Skin: No rashes, lesions Psychiatry: Calm, othwerwise cannot assess.  Assessment & Plan:  Principal Problem:   Sepsis due to undetermined organism [facility]) Active Problems:   Chronic atrial fibrillation with RVR (HCC)   Type 2 diabetes mellitus with chronic kidney disease, with long-term current use of insulin (HCC)   Essential hypertension   Diabetic neuropathy (HCC)   Seizure disorder (HCC)    Sepsis secondary to viral infection - PCR positive for coronavirus NL 63 - No leukocytosis, cortisol within normal limits Influenza/RSV/COVID negative - UA unremarkable - Chest x-ray with vascular congestion - Blood cultures taken, pending, no growth at 12 hours.  Follow closely -CT chest abdomen pelvis: Without any acute abnormality --Suspect this is all secondary to viral infection, procalcitonin is unremarkable.  Will discontinue further antibiotics  Altered mental status History of CVA - This is likely multifactorial - Unclear if patient was taking seizure meds at skilled nursing facility, they have been resumed now - Will further broaden workup with thiamine levels, B12, RPR, HIV, ammonia - Have also ordered CK to rule out recent seizure activity - Patient had recent brain MRI on prior admission 2 weeks ago which revealed chronic left MCA infarct as well as a  hypoplastic distal left vertebral artery with absent flow suggesting possible occlusion.  -- Have ordered repeat head CT.  Family reports history of aneurysms with attempted clipping in the past. --Some altered mentation may be secondary to viral infection.  Would best resolution as infection clears - PT/OT when patient is able to follow  commands and participate. - Baseline: Patient is able to ambulate and prepare meals, does have some dysarthria at baseline.  Chronic atrial fibrillation with RVR - Continue Coreg - IV Cardizem as needed - Continue home dose Eliquis - Suspect RVR is triggered by acute infection  Type 2 diabetes with chronic kidney disease, with long-term use of insulin - Associated stage IIIb CKD, stable - Continue basal/bolus/sliding scale insulin - Continue Jardiance  Seizure disorder - Continue Keppra  Diabetic neuropathy - Continue Neurontin  Hypertension - Continue home dose antihypertensives Mild prominence of ascending aorta - 4 cm on CT - Annual follow-up recommended by CTA or MRA  DVT prophylaxis:  SCDs for now, hold off on anticoagulation until head CT.   Code Status: Full Code Family Communication: Discussed directly with the patient's husband, Thereasa Distance on the phone.  He endorses family is very concerned that she still appears far from her physiologic baseline.   Disposition:  Inpatient still hospitalized for work up, will discharge to SNF when returned to her baseline  Consultants:    Procedures:    Antimicrobials:  Anti-infectives (From admission, onward)    Start     Dose/Rate Route Frequency Ordered Stop   09/24/23 1000  metroNIDAZOLE (FLAGYL) IVPB 500 mg        500 mg 100 mL/hr over 60 Minutes Intravenous Every 12 hours 09/24/23 0150 10/01/23 0959   09/24/23 1000  vancomycin (VANCOREADY) IVPB 750 mg/150 mL        750 mg 150 mL/hr over 60 Minutes Intravenous Every 24 hours 09/24/23 0203     09/24/23 0800  ceFEPIme (MAXIPIME) 2 g in sodium chloride 0.9 % 100 mL IVPB        2 g 200 mL/hr over 30 Minutes Intravenous Every 12 hours 09/24/23 0203     09/24/23 0200  ceFEPIme (MAXIPIME) 2 g in sodium chloride 0.9 % 100 mL IVPB  Status:  Discontinued        2 g 200 mL/hr over 30 Minutes Intravenous  Once 09/24/23 0150 09/24/23 0152   09/24/23 0200  vancomycin (VANCOCIN) IVPB  1000 mg/200 mL premix  Status:  Discontinued        1,000 mg 200 mL/hr over 60 Minutes Intravenous  Once 09/24/23 0150 09/24/23 0152   09/23/23 2045  ceFEPIme (MAXIPIME) 2 g in sodium chloride 0.9 % 100 mL IVPB        2 g 200 mL/hr over 30 Minutes Intravenous  Once 09/23/23 2042 09/23/23 2230   09/23/23 2045  metroNIDAZOLE (FLAGYL) IVPB 500 mg        500 mg 100 mL/hr over 60 Minutes Intravenous  Once 09/23/23 2042 09/23/23 2330   09/23/23 2045  vancomycin (VANCOCIN) IVPB 1000 mg/200 mL premix        1,000 mg 200 mL/hr over 60 Minutes Intravenous  Once 09/23/23 2042 09/23/23 2329       Data Reviewed: I have personally reviewed following labs and imaging studies CBC: Recent Labs  Lab 09/23/23 2105 09/24/23 0248  WBC 6.0 6.5  NEUTROABS 4.2  --   HGB 14.6 14.6  HCT 46.5* 46.7*  MCV 85.3 86.2  PLT 257 238  Basic Metabolic Panel: Recent Labs  Lab 09/23/23 2105 09/24/23 0248  NA 134* 137  K 3.9 4.0  CL 105 107  CO2 19* 18*  GLUCOSE 117* 129*  BUN 23* 22*  CREATININE 1.74* 1.64*  CALCIUM 8.6* 8.6*   GFR: Estimated Creatinine Clearance: 48.1 mL/min (A) (by C-G formula based on SCr of 1.64 mg/dL (H)). Liver Function Tests: Recent Labs  Lab 09/23/23 2105  AST 18  ALT 16  ALKPHOS 83  BILITOT 1.1  PROT 7.6  ALBUMIN 3.8   CBG: No results for input(s): "GLUCAP" in the last 168 hours.  Recent Results (from the past 240 hours)  Resp panel by RT-PCR (RSV, Flu A&B, Covid) Anterior Nasal Swab     Status: None   Collection Time: 09/23/23  8:50 PM   Specimen: Anterior Nasal Swab  Result Value Ref Range Status   SARS Coronavirus 2 by RT PCR NEGATIVE NEGATIVE Final    Comment: (NOTE) SARS-CoV-2 target nucleic acids are NOT DETECTED.  The SARS-CoV-2 RNA is generally detectable in upper respiratory specimens during the acute phase of infection. The lowest concentration of SARS-CoV-2 viral copies this assay can detect is 138 copies/mL. A negative result does not preclude  SARS-Cov-2 infection and should not be used as the sole basis for treatment or other patient management decisions. A negative result may occur with  improper specimen collection/handling, submission of specimen other than nasopharyngeal swab, presence of viral mutation(s) within the areas targeted by this assay, and inadequate number of viral copies(<138 copies/mL). A negative result must be combined with clinical observations, patient history, and epidemiological information. The expected result is Negative.  Fact Sheet for Patients:  BloggerCourse.com  Fact Sheet for Healthcare Providers:  SeriousBroker.it  This test is no t yet approved or cleared by the [place] FDA and  has been authorized for detection and/or diagnosis of SARS-CoV-2 by FDA under an Emergency Use Authorization (EUA). This EUA will remain  in effect (meaning this test can be used) for the duration of the COVID-19 declaration under Section 564(b)(1) of the Act, 21 U.S.C.section 360bbb-3(b)(1), unless the authorization is terminated  or revoked sooner.       Influenza A by PCR NEGATIVE NEGATIVE Final   Influenza B by PCR NEGATIVE NEGATIVE Final    Comment: (NOTE) The Xpert Xpress SARS-CoV-2/FLU/RSV plus assay is intended as an aid in the diagnosis of influenza from Nasopharyngeal swab specimens and should not be used as a sole basis for treatment. Nasal washings and aspirates are unacceptable for Xpert Xpress SARS-CoV-2/FLU/RSV testing.  Fact Sheet for Patients: BloggerCourse.com  Fact Sheet for Healthcare Providers: SeriousBroker.it  This test is not yet approved or cleared by the [place] FDA and has been authorized for detection and/or diagnosis of SARS-CoV-2 by FDA under an Emergency Use Authorization (EUA). This EUA will remain in effect (meaning this test can be used) for the duration of  the COVID-19 declaration under Section 564(b)(1) of the Act, 21 U.S.C. section 360bbb-3(b)(1), unless the authorization is terminated or revoked.     Resp Syncytial Virus by PCR NEGATIVE NEGATIVE Final    Comment: (NOTE) Fact Sheet for Patients: BloggerCourse.com  Fact Sheet for Healthcare Providers: SeriousBroker.it  This test is not yet approved or cleared by the [place] FDA and has been authorized for detection and/or diagnosis of SARS-CoV-2 by FDA under an Emergency Use Authorization (EUA). This EUA will remain in effect (meaning this test can be used) for the duration of the COVID-19 declaration under  Section 564(b)(1) of the Act, 21 U.S.C. section 360bbb-3(b)(1), unless the authorization is terminated or revoked.  Performed at [facility], [address], [place], [name] [address]   Blood Culture (routine x 2)     Status: None (Preliminary result)   Collection Time: 09/23/23  9:05 PM   Specimen: BLOOD RIGHT FOREARM  Result Value Ref Range Status   Specimen Description   Final    BLOOD RIGHT FOREARM BOTTLES DRAWN AEROBIC AND ANAEROBIC   Special Requests   Final    Blood Culture results may not be optimal due to an inadequate volume of blood received in culture bottles   Culture   Final    NO GROWTH < 12 HOURS Performed at [facility], [address] [place], [name] [address]    Report Status PENDING  Incomplete  Blood Culture (routine x 2)     Status: None (Preliminary result)   Collection Time: 09/23/23  9:05 PM   Specimen: Left Antecubital; Blood  Result Value Ref Range Status   Specimen Description   Final    LEFT ANTECUBITAL BOTTLES DRAWN AEROBIC AND ANAEROBIC   Special Requests Blood Culture adequate volume  Final   Culture   Final    NO GROWTH < 12 HOURS Performed at [facility], [address], [place], [name] [address]    Report Status PENDING  Incomplete     Radiology Studies: CT CHEST ABDOMEN  PELVIS WO CONTRAST Result Date: 09/23/2023 CLINICAL DATA:  Fevers with possible sepsis, initial encounter EXAM: CT CHEST, ABDOMEN AND PELVIS WITHOUT CONTRAST TECHNIQUE: Multidetector CT imaging of the chest, abdomen and pelvis was performed following the standard protocol without IV contrast. RADIATION DOSE REDUCTION: This exam was performed according to the departmental dose-optimization program which includes automated exposure control, adjustment of the mA and/or kV according to patient size and/or use of iterative reconstruction technique. COMPARISON:  Chest x-ray from earlier in the same day. CT from 08/18/2023 FINDINGS: CT CHEST FINDINGS Cardiovascular: Somewhat limited due to lack of IV contrast. Atherosclerotic calcifications are noted. Ascending aorta is mildly prominent at 4 cm. Normal tapering is noted in the thoracic aortic arch. Heart is mildly enlarged in size. Mediastinum/Nodes: Thoracic inlet is within normal limits. No hilar or mediastinal adenopathy is noted. The esophagus as visualized is within normal limits. Lungs/Pleura: Lungs are well aerated bilaterally. No focal infiltrate or sizable effusion is seen. No parenchymal nodules are noted. Musculoskeletal: No chest wall mass or suspicious bone lesions identified. CT ABDOMEN PELVIS FINDINGS Hepatobiliary: No focal liver abnormality is seen. Status post cholecystectomy. No biliary dilatation. Pancreas: Unremarkable. No pancreatic ductal dilatation or surrounding inflammatory changes. Spleen: Normal in size without focal abnormality. Adrenals/Urinary Tract: Adrenal glands are within normal limits. Kidneys are well visualized bilaterally. No renal calculi or obstructive changes are seen. Bladder is decompressed. Stomach/Bowel: No obstructive or inflammatory changes of the colon are seen. Fluid is noted throughout which may be related to a diarrheal state. The appendix is not well visualized and may have been surgically removed. No inflammatory  changes are noted. Small bowel and stomach are within normal limits. Vascular/Lymphatic: Aortic atherosclerosis. No enlarged abdominal or pelvic lymph nodes. Reproductive: Status post hysterectomy. No adnexal masses. Other: No abdominal wall hernia or abnormality. No abdominopelvic ascites. Musculoskeletal: No acute or significant osseous findings. IMPRESSION: CT of the chest: Mild prominence of the ascending aorta to 4 cm. Recommend annual imaging followup by CTA or MRA. This recommendation follows 2010 ACCF/AHA/AATS/ACR/ASA/SCA/SCAI/SIR/STS/SVM Guidelines for the Diagnosis and Management  of Patients with Thoracic Aortic Disease. Circulation. 2010; 121: Z610-R604. Aortic aneurysm NOS (ICD10-I71.9) CT of the abdomen and pelvis: No acute abnormality noted. Electronically Signed   By: Alcide Clever M.D.   On: 09/23/2023 23:37   DG Chest Port 1 View Result Date: 09/23/2023 CLINICAL DATA:  Sepsis EXAM: PORTABLE CHEST 1 VIEW COMPARISON:  None Available. FINDINGS: Lungs are clear. No pneumothorax or pleural effusion. Stable mild-to-moderate cardiomegaly. Mild central pulmonary vascular congestion without overt pulmonary edema. No acute bone abnormality. IMPRESSION: 1. Stable cardiomegaly with central pulmonary vascular congestion Electronically Signed   By: Helyn Numbers M.D.   On: 09/23/2023 21:57    Scheduled Meds:  apixaban  5 mg Oral BID   carvedilol  25 mg Oral BID WC   dicyclomine  20 mg Oral TID AC   empagliflozin  25 mg Oral QAC breakfast   ferrous sulfate  325 mg Oral Daily   furosemide  20 mg Oral Daily   gabapentin  200 mg Oral TID   hydrALAZINE  25 mg Oral TID   insulin glargine-yfgn  44 Units Subcutaneous Daily   isosorbide mononitrate  60 mg Oral Daily   levETIRAcetam  250 mg Oral BID   timolol  1 drop Both Eyes BID   tobramycin  1 drop Both Eyes QID   Continuous Infusions:  ceFEPime (MAXIPIME) IV 2 g (09/24/23 0749)   lactated ringers 150 mL/hr at 09/23/23 2059   lactated ringers      metronidazole     vancomycin       LOS: 1 day    Total time spent coordinating care:   Debarah Crape, DO [facility]  To contact the attending physician between 7A-7P please use Epic Chat. To contact the covering physician during after hours 7P-7A, please review Amion.   09/24/2023, 8:41 AM   *This document has been created with the assistance of dictation software. Please excuse typographical errors. *

## 2023-09-24 NOTE — Assessment & Plan Note (Signed)
-   This is associated with stage IIIb chronic kidney disease that is currently stable. - The patient will be placed on supplemental coverage with NovoLog. - We will continue basal coverage. - We will continue Jardiance.

## 2023-09-24 NOTE — Progress Notes (Signed)
Pharmacy Antibiotic Note  Robin [name] is a 58 y.o. female admitted on 09/23/2023 with sepsis.  Pharmacy has been consulted for vancomycin and cefepime dosing.  Plan: Vancomycin 750mg  IV Q24H. Goal AUC 400-550.  Expected AUC 415. Cefepime 2g IV Q12H.  Height: 5\' 7"  (170.2 cm) Weight: 108.9 kg (240 lb) IBW/kg (Calculated) : 61.6  Temp (24hrs), Avg:100.3 F (37.9 C), Min:98.6 F (37 C), Max:102 F (38.9 C)  Recent Labs  Lab 09/23/23 2105 09/23/23 2347  WBC 6.0  --   CREATININE 1.74*  --   LATICACIDVEN 0.9 1.1    Estimated Creatinine Clearance: 45.3 mL/min (A) (by C-G formula based on SCr of 1.74 mg/dL (H)).    Allergies  Allergen Reactions   Bee Venom Shortness Of Breath and Swelling    Bodily Swelling   Losartan Other (See Comments)    Nosebleeds   Naproxen Other (See Comments)    Acid reflux   Lisinopril Other (See Comments)    Sinus congestion   Penicillins Nausea Only    Thank you for allowing pharmacy to be a part of this patient's care.  Vernard Gambles, PharmD, BCPS  09/24/2023 2:00 AM

## 2023-09-24 NOTE — Assessment & Plan Note (Signed)
-   We will continue Coreg. - We will utilize IV Cardizem as needed. - We will continue Eliquis. - This is partly secondary to volume depletion and SIRS.

## 2023-09-24 NOTE — Assessment & Plan Note (Addendum)
-   This is SIRS with unclear source of infection. - This is likely the culprit for her acute metabolic cephalopathy. - Will need to rule out bacteremia. - The patient will be admitted to a medical telemetry bed. - Will continue broad-spectrum antibiotic therapy with IV cefepime, vancomycin and Flagyl. - We will continue hydration with IV lactated ringer. - We will follow blood cultures. - She had negative meningeal signs and current antibiotic would cover meningitis. - Respiratory 20 pathogens panel was sent. - Initial respiratory panel came back negative for COVID-19, influenza and RSV.

## 2023-09-24 NOTE — TOC Initial Note (Signed)
Transition of Care [facility]) - Initial/Assessment Note    Patient Details  Name: Robin [name] MRN: [id number] Date of Birth: 1966-03-18  Transition of [facility]) CM/SW Contact:    Elliot Gault, LCSW Phone Number: 09/24/2023, 2:15 PM  Clinical Narrative:                  Pt admitted from home. She has a high readmission risk score. Pt known to TOC from recent admission. Upon previous dc, pt went to [facility] for short term SNF rehab.  Pt unable to participate in assessment today. Spoke with pt's sister/emergency contact who states pt dc'd home from [facility] last Friday. She states pt went back to her own apartment where she resides alone. [facility] was set up for RN/PT/OT/ST. Pt also has a CAP aide seven days a week from 9am-1pm and Monday through Friday she also has the aide from 4pm-8pm.  Workup is in progress. TOC will follow and further assess and assist with dc planning once pt's needs become known.   Expected Discharge Plan: Home w Home Health Services Barriers to Discharge: Continued Medical Work up   Patient Goals and CMS Choice Patient states their goals for this hospitalization and ongoing recovery are:: get better          Expected Discharge Plan and Services In-house Referral: Clinical Social Work     Living arrangements for the past 2 months: Apartment                                      Prior Living Arrangements/Services Living arrangements for the past 2 months: Apartment Lives with:: Self Patient language and need for interpreter reviewed:: Yes        Need for Family Participation in Patient Care: Yes (Comment) Care giver support system in place?: Yes (comment)   Criminal Activity/Legal Involvement Pertinent to Current Situation/Hospitalization: No - Comment as needed  Activities of Daily Living      Permission Sought/Granted                  Emotional Assessment Appearance:: Appears stated age       Alcohol / Substance Use: Not  Applicable Psych Involvement: No (comment)  Admission diagnosis:  Sepsis due to undetermined organism [facility]) [A41.9] Patient Active Problem List   Diagnosis Date Noted   Chronic atrial fibrillation with RVR (HCC) 09/24/2023   Type 2 diabetes mellitus with chronic kidney disease, with long-term current use of insulin (HCC) 09/24/2023   Diabetic neuropathy (HCC) 09/24/2023   Seizure disorder (HCC) 09/24/2023   Sepsis due to undetermined organism (HCC) 09/23/2023   Focal seizures (HCC) 09/07/2023   SIRS (systemic inflammatory response syndrome) (HCC) 09/07/2023   Acute on chronic systolic CHF (congestive heart failure) (HCC) 11/27/2021   Stage 3b chronic kidney disease (CKD) (HCC) 11/27/2021   Microcytic anemia 11/27/2021   Lactic acidosis 11/27/2021   Diabetes mellitus (HCC) 03/09/2021   Type 2 diabetes mellitus with stage 3a chronic kidney disease, with long-term current use of insulin (HCC) 03/09/2021   Acute metabolic encephalopathy 12/31/2020   Hyperglycemia 12/31/2020   Prolonged QT interval 05/12/2020   History of CVA with residual deficit 05/12/2020   Atrial fibrillation, chronic (HCC) 05/12/2020   Right hemiparesis (HCC)    Essential hypertension    Chronic diastolic congestive heart failure (HCC)    Dysphagia due to recent stroke 01/05/2020   Aneurysm of right  carotid artery (HCC) paraclinoid 01/05/2020   Embolic cerebral infarction [facility]) s/p clot retrieval 12/27/2019   Thoracic aortic aneurysm without rupture [facility])    Atrial fibrillation with RVR (HCC) 05/10/2019   Type 2 diabetes mellitus with diabetic autonomic neuropathy, with long-term current use of insulin (HCC) 09/01/2018   Cocaine abuse (HCC) 08/25/2018   Cigarette nicotine dependence, uncomplicated 06/07/2015   GERD (gastroesophageal reflux disease) 06/07/2011   COPD (chronic obstructive pulmonary disease) (HCC) 06/07/2011   Arthritis 06/07/2011   PCP:  Jarold Motto, PA Pharmacy:   [facility] [address] [place] - [address] [name] [address] Phone: [phone] Fax: [phone]  [facility] [place] (New Address) - [place], [place] - [address] AT Previously: [address], [place] [address] [place] [address] Phone: [phone] Fax: [phone]   - [facility] [address] [name] [address] Phone: [phone] Fax: [phone]     Social Drivers of Health (SDOH) Social History: SDOH Screenings   Food Insecurity: No Food Insecurity (09/07/2023)  Housing: Low Risk  (09/07/2023)  Transportation Needs: No Transportation Needs (09/18/2023)   Received from [facility]  Utilities: Not At Risk (09/07/2023)  Depression (PHQ2-9): Low Risk  (02/05/2023)  Tobacco Use: Medium Risk (09/04/2023)  Health Literacy: Medium Risk (09/18/2023)   Received from [facility]   SDOH Interventions:     Readmission Risk Interventions    09/24/2023    2:14 PM 09/08/2023    9:27 AM  Readmission Risk Prevention Plan  Transportation Screening Complete Complete  HRI or Home Care Consult  Complete  Social Work Consult for Recovery Care Planning/Counseling  Complete  Palliative Care Screening  Not Applicable  Medication Review [employer]) Complete Complete  HRI or Home Care Consult Complete   SW Recovery Care/Counseling Consult Complete   Palliative Care Screening Not Applicable   Skilled Nursing Facility Not Applicable

## 2023-09-24 NOTE — Assessment & Plan Note (Signed)
-   We will continue Keppra. 

## 2023-09-25 ENCOUNTER — Encounter (HOSPITAL_COMMUNITY): Payer: Self-pay | Admitting: Family Medicine

## 2023-09-25 DIAGNOSIS — A419 Sepsis, unspecified organism: Secondary | ICD-10-CM | POA: Diagnosis not present

## 2023-09-25 LAB — COMPREHENSIVE METABOLIC PANEL
ALT: 14 U/L (ref 0–44)
AST: 18 U/L (ref 15–41)
Albumin: 3.4 g/dL — ABNORMAL LOW (ref 3.5–5.0)
Alkaline Phosphatase: 67 U/L (ref 38–126)
Anion gap: 9 (ref 5–15)
BUN: 18 mg/dL (ref 6–20)
CO2: 20 mmol/L — ABNORMAL LOW (ref 22–32)
Calcium: 8.4 mg/dL — ABNORMAL LOW (ref 8.9–10.3)
Chloride: 106 mmol/L (ref 98–111)
Creatinine, Ser: 1.4 mg/dL — ABNORMAL HIGH (ref 0.44–1.00)
GFR, Estimated: 44 mL/min — ABNORMAL LOW (ref >60)
Glucose, Bld: 90 mg/dL (ref 70–99)
Potassium: 3.6 mmol/L (ref 3.5–5.1)
Sodium: 135 mmol/L (ref 135–145)
Total Bilirubin: 1.2 mg/dL (ref 0.0–1.2)
Total Protein: 6.9 g/dL (ref 6.5–8.1)

## 2023-09-25 LAB — CBC WITH DIFFERENTIAL/PLATELET
Abs Immature Granulocytes: 0.01 10*3/uL (ref 0.00–0.07)
Basophils Absolute: 0 10*3/uL (ref 0.0–0.1)
Basophils Relative: 1 %
Eosinophils Absolute: 0.3 10*3/uL (ref 0.0–0.5)
Eosinophils Relative: 5 %
HCT: 44.1 % (ref 36.0–46.0)
Hemoglobin: 14 g/dL (ref 12.0–15.0)
Immature Granulocytes: 0 %
Lymphocytes Relative: 28 %
Lymphs Abs: 1.5 10*3/uL (ref 0.7–4.0)
MCH: 27.2 pg (ref 26.0–34.0)
MCHC: 31.7 g/dL (ref 30.0–36.0)
MCV: 85.6 fL (ref 80.0–100.0)
Monocytes Absolute: 0.8 10*3/uL (ref 0.1–1.0)
Monocytes Relative: 15 %
Neutro Abs: 2.7 10*3/uL (ref 1.7–7.7)
Neutrophils Relative %: 51 %
Platelets: 207 10*3/uL (ref 150–400)
RBC: 5.15 MIL/uL — ABNORMAL HIGH (ref 3.87–5.11)
RDW: 15.1 % (ref 11.5–15.5)
WBC: 5.3 10*3/uL (ref 4.0–10.5)
nRBC: 0 % (ref 0.0–0.2)

## 2023-09-25 LAB — MAGNESIUM: Magnesium: 1.8 mg/dL (ref 1.7–2.4)

## 2023-09-25 LAB — RPR: RPR Ser Ql: NONREACTIVE

## 2023-09-25 LAB — GLUCOSE, CAPILLARY
Glucose-Capillary: 107 mg/dL — ABNORMAL HIGH (ref 70–99)
Glucose-Capillary: 106 mg/dL — ABNORMAL HIGH (ref 70–99)
Glucose-Capillary: 126 mg/dL — ABNORMAL HIGH (ref 70–99)

## 2023-09-25 LAB — PHOSPHORUS: Phosphorus: 2 mg/dL — ABNORMAL LOW (ref 2.5–4.6)

## 2023-09-25 NOTE — Progress Notes (Signed)
PROGRESS NOTE    Robin [name]  ZOX:[id number] DOB: April 17, 1966 DOA: 09/23/2023 PCP: Jarold Motto, PA  Chief Complaint  Patient presents with   Altered Mental Status    Hospital Course:  Robin [name] is 58 y.o. female with heart failure with reduced EF, aortic stenosis, COPD, type 2 diabetes, chronic atrial fibrillation on Eliquis, hypertension, GERD, gout, history of CVA, who presented to the ED with altered mental status and fever.  Patient also endorsed recent confusion and diminished appetite, without GI upset.  In the ED patient met SIRS criteria, febrile to 102, tachycardic to 120.  Labs revealed mild hyponatremia and creatinine 1.74.  Urinalysis is glucose but otherwise unremarkable.  EKG revealed A-fib with RVR.  Chest x-ray with vascular congestion.  Patient was given Tylenol, cefepime, Flagyl, vancomycin and 1 L NS bolus.  She was admitted for medical telemetry. Note patient was recently admitted for similar presentation and during that admission was found to have new focal seizures.  She was evaluated by neurology and started on Keppra 250 twice daily.  She was discharged directly to skilled nursing facility.  Pt was discharged from SNF on 2/13 and returned home independently with the assistance of caregivers.  Subjective: No acute events overnight. On evaluation today patient is somewhat improved.  She is able to report her name, she responds that the month is December.  She has eaten some of her food independently    Objective: Vitals:   09/25/23 0500 09/25/23 0530 09/25/23 0535 09/25/23 0600  BP: 136/83 (!) 138/95  (!) 145/92  Pulse: 97 (!) 102 95 98  Resp: 20 19 17 18   Temp:      TempSrc:      SpO2: 95% 97% 98% 97%  Weight:      Height:       No intake or output data in the 24 hours ending 09/25/23 8119 Filed Weights   09/24/23 0100  Weight: 108.9 kg    Examination: General exam: Appears calm and comfortable, NAD Respiratory system: No work of breathing,  symmetric chest wall expansion Cardiovascular system: S1 & S2 heard, RRR.  Gastrointestinal system: Abdomen is nondistended, soft and nontender.  Neuro: Alert, oriented to self only, requires significant prompting to keep tracking.   Extremities: Symmetric, expected ROM Skin: No rashes, lesions Psychiatry: Calm, othwerwise difficult to assess.  Assessment & Plan:  Principal Problem:   Sepsis due to undetermined organism [facility]) Active Problems:   Chronic atrial fibrillation with RVR (HCC)   Type 2 diabetes mellitus with chronic kidney disease, with long-term current use of insulin (HCC)   Essential hypertension   Diabetic neuropathy (HCC)   Seizure disorder (HCC)    Sepsis secondary to viral infection - PCR positive for coronavirus NL63 - No leukocytosis, cortisol within normal limits. -- Influenza/RSV/COVID negative - UA unremarkable - Chest x-ray with vascular congestion - Blood cultures taken, Negative to date. - CT chest abdomen pelvis: Without any acute abnormality -- Suspect this is all secondary to viral infection, procalcitonin is unremarkable.  Will discontinue further antibiotics  Altered mental status History of CVA - This is likely multifactorial, suspect most is secondary to generalized deconditioning combined with acute viral infection.  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  --  Family reports history of aneurysms with attempted clipping in the past. -- Repeat Head CT this admission is unremarkable.  - PT/OT evals ordered - Baseline: Patient lives independently, has caregivers from 9-1 daily, is able to ambulate and prepare meals, does have some dysarthria at baseline.  Chronic atrial fibrillation with RVR -  Continue Coreg - IV Cardizem as needed - Continue home dose Eliquis - Suspect RVR is triggered by acute infection  Type 2 diabetes with chronic kidney disease, with long-term use of insulin - Associated stage IIIb CKD, stable - Continue basal/bolus/sliding scale insulin - Continue Jardiance  Seizure disorder - Continue Keppra  Diabetic neuropathy - Continue Neurontin  Hypertension - Continue home dose antihypertensives  Mild prominence of ascending aorta - 4 cm on CT - Annual follow-up recommended by CTA or MRA  DVT prophylaxis: Eliquis   Code Status: Full Code Family Communication: Discussed directly with the patient's sister on the phone. Disposition: Inpatient, still hospitalized due to altered mentation.  Appears she is slowly resolving.  Will likely discharge back to home with home health.  PT eval's pending.  Could benefit from home PT/OT.  Consultants:    Procedures:    Antimicrobials:  Anti-infectives (From admission, onward)    Start     Dose/Rate Route Frequency Ordered Stop   09/24/23 1000  metroNIDAZOLE (FLAGYL) IVPB 500 mg  Status:  Discontinued        500 mg 100 mL/hr over 60 Minutes Intravenous Every 12 hours 09/24/23 0150 09/24/23 1454   09/24/23 1000  vancomycin (VANCOREADY) IVPB 750 mg/150 mL  Status:  Discontinued        750 mg 150 mL/hr over 60 Minutes Intravenous Every 24 hours 09/24/23 0203 09/24/23 1454   09/24/23 0800  ceFEPIme (MAXIPIME) 2 g in sodium chloride 0.9 % 100 mL IVPB  Status:  Discontinued        2 g 200 mL/hr over 30 Minutes Intravenous Every 12 hours 09/24/23 0203 09/24/23 1454   09/24/23 0200  ceFEPIme (MAXIPIME) 2 g in sodium chloride 0.9 % 100 mL IVPB  Status:  Discontinued        2 g 200 mL/hr over 30 Minutes Intravenous  Once 09/24/23 0150 09/24/23 0152   09/24/23 0200  vancomycin (VANCOCIN) IVPB 1000 mg/200 mL premix  Status:  Discontinued        1,000 mg 200 mL/hr over 60 Minutes Intravenous  Once 09/24/23 0150 09/24/23  0152   09/23/23 2045  ceFEPIme (MAXIPIME) 2 g in sodium chloride 0.9 % 100 mL IVPB        2 g 200 mL/hr over 30 Minutes Intravenous  Once 09/23/23 2042 09/23/23 2230   09/23/23 2045  metroNIDAZOLE (FLAGYL) IVPB 500 mg        500 mg 100 mL/hr over 60 Minutes Intravenous  Once 09/23/23 2042 09/23/23 2330   09/23/23 2045  vancomycin (VANCOCIN) IVPB 1000 mg/200 mL premix        1,000 mg 200 mL/hr over 60 Minutes Intravenous  Once 09/23/23 2042 09/23/23 2329       Data Reviewed: I have personally reviewed following labs and imaging studies CBC: Recent Labs  Lab 09/23/23 2105 09/24/23 0248 09/25/23 0427  WBC 6.0 6.5 5.3  NEUTROABS 4.2  --  2.7  HGB 14.6 14.6 14.0  HCT 46.5* 46.7* 44.1  MCV 85.3 86.2 85.6  PLT 257 238 207   Basic Metabolic Panel: Recent Labs  Lab 09/23/23 2105 09/24/23 0248 09/25/23 0427  NA 134* 137  135  K 3.9 4.0 3.6  CL 105 107 106  CO2 19* 18* 20*  GLUCOSE 117* 129* 90  BUN 23* 22* 18  CREATININE 1.74* 1.64* 1.40*  CALCIUM 8.6* 8.6* 8.4*  MG  --   --  1.8  PHOS  --   --  2.0*   GFR: Estimated Creatinine Clearance: 56.3 mL/min (A) (by C-G formula based on SCr of 1.4 mg/dL (H)). Liver Function Tests: Recent Labs  Lab 09/23/23 2105 09/25/23 0427  AST 18 18  ALT 16 14  ALKPHOS 83 67  BILITOT 1.1 1.2  PROT 7.6 6.9  ALBUMIN 3.8 3.4*   CBG: Recent Labs  Lab 09/24/23 0929  GLUCAP 93    Recent Results (from the past 240 hours)  Resp panel by RT-PCR (RSV, Flu A&B, Covid) Anterior Nasal Swab     Status: None   Collection Time: 09/23/23  8:50 PM   Specimen: Anterior Nasal Swab  Result Value Ref Range Status   SARS Coronavirus 2 by RT PCR NEGATIVE NEGATIVE Final    Comment: (NOTE) SARS-CoV-2 target nucleic acids are NOT DETECTED.  The SARS-CoV-2 RNA is generally detectable in upper respiratory specimens during the acute phase of infection. The lowest concentration of SARS-CoV-2 viral copies this assay can detect is 138 copies/mL. A  negative result does not preclude SARS-Cov-2 infection and should not be used as the sole basis for treatment or other patient management decisions. A negative result may occur with  improper specimen collection/handling, submission of specimen other than nasopharyngeal swab, presence of viral mutation(s) within the areas targeted by this assay, and inadequate number of viral copies(<138 copies/mL). A negative result must be combined with clinical observations, patient history, and epidemiological information. The expected result is Negative.  Fact Sheet for Patients:  BloggerCourse.com  Fact Sheet for Healthcare Providers:  SeriousBroker.it  This test is no t yet approved or cleared by the [place] FDA and  has been authorized for detection and/or diagnosis of SARS-CoV-2 by FDA under an Emergency Use Authorization (EUA). This EUA will remain  in effect (meaning this test can be used) for the duration of the COVID-19 declaration under Section 564(b)(1) of the Act, 21 U.S.C.section 360bbb-3(b)(1), unless the authorization is terminated  or revoked sooner.       Influenza A by PCR NEGATIVE NEGATIVE Final   Influenza B by PCR NEGATIVE NEGATIVE Final    Comment: (NOTE) The Xpert Xpress SARS-CoV-2/FLU/RSV plus assay is intended as an aid in the diagnosis of influenza from Nasopharyngeal swab specimens and should not be used as a sole basis for treatment. Nasal washings and aspirates are unacceptable for Xpert Xpress SARS-CoV-2/FLU/RSV testing.  Fact Sheet for Patients: BloggerCourse.com  Fact Sheet for Healthcare Providers: SeriousBroker.it  This test is not yet approved or cleared by the [place] FDA and has been authorized for detection and/or diagnosis of SARS-CoV-2 by FDA under an Emergency Use Authorization (EUA). This EUA will remain in effect (meaning this test can  be used) for the duration of the COVID-19 declaration under Section 564(b)(1) of the Act, 21 U.S.C. section 360bbb-3(b)(1), unless the authorization is terminated or revoked.     Resp Syncytial Virus by PCR NEGATIVE NEGATIVE Final    Comment: (NOTE) Fact Sheet for Patients: BloggerCourse.com  Fact Sheet for Healthcare Providers: SeriousBroker.it  This test is not yet approved or cleared by the [place] FDA and has been authorized for detection and/or diagnosis of SARS-CoV-2 by FDA under an Emergency Use Authorization (EUA).  This EUA will remain in effect (meaning this test can be used) for the duration of the COVID-19 declaration under Section 564(b)(1) of the Act, 21 U.S.C. section 360bbb-3(b)(1), unless the authorization is terminated or revoked.  Performed at [facility], [address] [place], [name] [address]   Blood Culture (routine x 2)     Status: None (Preliminary result)   Collection Time: 09/23/23  9:05 PM   Specimen: BLOOD RIGHT FOREARM  Result Value Ref Range Status   Specimen Description   Final    BLOOD RIGHT FOREARM BOTTLES DRAWN AEROBIC AND ANAEROBIC   Special Requests   Final    Blood Culture results may not be optimal due to an inadequate volume of blood received in culture bottles   Culture   Final    NO GROWTH 2 DAYS Performed at [facility], [address] [place], [name] [address]    Report Status PENDING  Incomplete  Blood Culture (routine x 2)     Status: None (Preliminary result)   Collection Time: 09/23/23  9:05 PM   Specimen: Left Antecubital; Blood  Result Value Ref Range Status   Specimen Description   Final    LEFT ANTECUBITAL BOTTLES DRAWN AEROBIC AND ANAEROBIC   Special Requests Blood Culture adequate volume  Final   Culture   Final    NO GROWTH 2 DAYS Performed at [facility], [address], [place], [name] [address]    Report Status PENDING  Incomplete  Respiratory (~20  pathogens) panel by PCR     Status: Abnormal   Collection Time: 09/24/23  6:00 AM   Specimen: Nasopharyngeal Swab; Respiratory  Result Value Ref Range Status   Adenovirus NOT DETECTED NOT DETECTED Final   Coronavirus 229E NOT DETECTED NOT DETECTED Final    Comment: (NOTE) The Coronavirus on the Respiratory Panel, DOES NOT test for the novel  Coronavirus (2019 nCoV)    Coronavirus HKU1 NOT DETECTED NOT DETECTED Final   Coronavirus NL63 DETECTED (A) NOT DETECTED Final   Coronavirus OC43 NOT DETECTED NOT DETECTED Final   Metapneumovirus NOT DETECTED NOT DETECTED Final   Rhinovirus / Enterovirus NOT DETECTED NOT DETECTED Final   Influenza A NOT DETECTED NOT DETECTED Final   Influenza B NOT DETECTED NOT DETECTED Final   Parainfluenza Virus 1 NOT DETECTED NOT DETECTED Final   Parainfluenza Virus 2 NOT DETECTED NOT DETECTED Final   Parainfluenza Virus 3 NOT DETECTED NOT DETECTED Final   Parainfluenza Virus 4 NOT DETECTED NOT DETECTED Final   Respiratory Syncytial Virus NOT DETECTED NOT DETECTED Final   Bordetella pertussis NOT DETECTED NOT DETECTED Final   Bordetella Parapertussis NOT DETECTED NOT DETECTED Final   Chlamydophila pneumoniae NOT DETECTED NOT DETECTED Final   Mycoplasma pneumoniae NOT DETECTED NOT DETECTED Final    Comment: Performed at [facility], [address] [place], [name] [address]     Radiology Studies: CT HEAD WO CONTRAST ( ) Result Date: 09/24/2023 CLINICAL DATA:  Mental status change, persistent or worsening. Fever. EXAM: CT HEAD WITHOUT CONTRAST TECHNIQUE: Contiguous axial images were obtained from the base of the skull through the vertex without intravenous contrast. RADIATION DOSE REDUCTION: This exam was performed according to the departmental dose-optimization program which includes automated exposure control, adjustment of the mA and/or kV according to patient size and/or use of iterative reconstruction technique. COMPARISON:  None Available.  FINDINGS: Brain: No acute hemorrhage. Unchanged old, chronic left MCA territory infarct. No new loss of gray-white differentiation. No hydrocephalus or  extra-axial collection. No mass effect or midline shift. Vascular: No hyperdense vessel or unexpected calcification. Skull: No calvarial fracture or suspicious bone lesion. Skull base is unremarkable. Sinuses/Orbits: No acute finding. Other: None. IMPRESSION: 1. No acute intracranial abnormality. 2. Unchanged old, chronic left MCA territory infarct. Electronically Signed   By: Orvan Falconer M.D.   On: 09/24/2023 15:16   CT CHEST ABDOMEN PELVIS WO CONTRAST Result Date: 09/23/2023 CLINICAL DATA:  Fevers with possible sepsis, initial encounter EXAM: CT CHEST, ABDOMEN AND PELVIS WITHOUT CONTRAST TECHNIQUE: Multidetector CT imaging of the chest, abdomen and pelvis was performed following the standard protocol without IV contrast. RADIATION DOSE REDUCTION: This exam was performed according to the departmental dose-optimization program which includes automated exposure control, adjustment of the mA and/or kV according to patient size and/or use of iterative reconstruction technique. COMPARISON:  Chest x-ray from earlier in the same day. CT from 08/18/2023 FINDINGS: CT CHEST FINDINGS Cardiovascular: Somewhat limited due to lack of IV contrast. Atherosclerotic calcifications are noted. Ascending aorta is mildly prominent at 4 cm. Normal tapering is noted in the thoracic aortic arch. Heart is mildly enlarged in size. Mediastinum/Nodes: Thoracic inlet is within normal limits. No hilar or mediastinal adenopathy is noted. The esophagus as visualized is within normal limits. Lungs/Pleura: Lungs are well aerated bilaterally. No focal infiltrate or sizable effusion is seen. No parenchymal nodules are noted. Musculoskeletal: No chest wall mass or suspicious bone lesions identified. CT ABDOMEN PELVIS FINDINGS Hepatobiliary: No focal liver abnormality is seen. Status post  cholecystectomy. No biliary dilatation. Pancreas: Unremarkable. No pancreatic ductal dilatation or surrounding inflammatory changes. Spleen: Normal in size without focal abnormality. Adrenals/Urinary Tract: Adrenal glands are within normal limits. Kidneys are well visualized bilaterally. No renal calculi or obstructive changes are seen. Bladder is decompressed. Stomach/Bowel: No obstructive or inflammatory changes of the colon are seen. Fluid is noted throughout which may be related to a diarrheal state. The appendix is not well visualized and may have been surgically removed. No inflammatory changes are noted. Small bowel and stomach are within normal limits. Vascular/Lymphatic: Aortic atherosclerosis. No enlarged abdominal or pelvic lymph nodes. Reproductive: Status post hysterectomy. No adnexal masses. Other: No abdominal wall hernia or abnormality. No abdominopelvic ascites. Musculoskeletal: No acute or significant osseous findings. IMPRESSION: CT of the chest: Mild prominence of the ascending aorta to 4 cm. Recommend annual imaging followup by CTA or MRA. This recommendation follows 2010 ACCF/AHA/AATS/ACR/ASA/SCA/SCAI/SIR/STS/SVM Guidelines for the Diagnosis and Management of Patients with Thoracic Aortic Disease. Circulation. 2010; 121: W098-J191. Aortic aneurysm NOS (ICD10-I71.9) CT of the abdomen and pelvis: No acute abnormality noted. Electronically Signed   By: Alcide Clever M.D.   On: 09/23/2023 23:37   DG Chest Port 1 View Result Date: 09/23/2023 CLINICAL DATA:  Sepsis EXAM: PORTABLE CHEST 1 VIEW COMPARISON:  None Available. FINDINGS: Lungs are clear. No pneumothorax or pleural effusion. Stable mild-to-moderate cardiomegaly. Mild central pulmonary vascular congestion without overt pulmonary edema. No acute bone abnormality. IMPRESSION: 1. Stable cardiomegaly with central pulmonary vascular congestion Electronically Signed   By: Helyn Numbers M.D.   On: 09/23/2023 21:57    Scheduled Meds:  apixaban   5 mg Oral BID   carvedilol  25 mg Oral BID WC   dicyclomine  20 mg Oral TID AC   empagliflozin  25 mg Oral QAC breakfast   ferrous sulfate  325 mg Oral Daily   furosemide  20 mg Oral Daily   gabapentin  200 mg Oral TID   hydrALAZINE  25 mg  Oral TID   insulin glargine-yfgn  44 Units Subcutaneous Daily   isosorbide mononitrate  60 mg Oral Daily   levETIRAcetam  250 mg Oral BID   timolol  1 drop Both Eyes BID   tobramycin  1 drop Both Eyes QID   Continuous Infusions:     LOS: 2 days    Total time spent coordinating care:   Debarah Crape, DO [facility]  To contact the attending physician between 7A-7P please use Epic Chat. To contact the covering physician during after hours 7P-7A, please review Amion.   09/25/2023, 8:12 AM   *This document has been created with the assistance of dictation software. Please excuse typographical errors. *

## 2023-09-25 NOTE — Evaluation (Signed)
Physical Therapy Evaluation Patient Details Name: Robin [name] MRN: [id number] DOB: 14-Sep-1965 Today's Date: 09/25/2023  History of Present Illness  Robin [name] is a 58 y.o. African-American female with medical history significant for chronic HFrEF, aortic stenosis, COPD, type 2 diabetes mellitus, chronic atrial fibrillation on Eliquis, essential hypertension, GERD, gout, and CVA, who presented to the ER with acute onset of altered mental status with reported fever with recent confusion and diminished appetite.  She did not have any reported nausea or vomiting or diarrhea or abdominal pain.  No chest pain or palpitations.  No reported cough or wheezing.  She was having a headache and mild neck pain.  No dysuria, oliguria or hematuria or flank pain.  The patient was very somnolent and therefore a poor historian.    Clinical Impression  On therapist arrival; patient lying in bed but awake. She is agreeable to therapist assessment.  Patient takes extra time and needs min assist to fully bring trunk upright for supine to sit.  Patient demonstrates wide BOS in sitting at the edge of the bed; needs CGA/min A to remain fully upright. Sit to stand with handheld assist and step pivot to the chair.  Patient with slow gait speed; noted weakness right side versus left. Uncontrolled descent with stand to sit.   Patient does moan occasionally during transitions and states "yes" to pain but unable to verbalize where she is hurting. patient left in chair with call button in reach, chair alarm set and nursing notified of mobility status. Patient will benefit from continued skilled therapy services during the remainder of her hospital stay and at the next recommended venue of care to address deficits and promote return to optimal function.           If plan is discharge home, recommend the following: A lot of help with bathing/dressing/bathroom;Help with stairs or ramp for entrance;Assistance with  cooking/housework;A little help with walking and/or transfers   Can travel by private vehicle   Yes    Equipment Recommendations None recommended by PT  Recommendations for Other Services       Functional Status Assessment Patient has had a recent decline in their functional status and demonstrates the ability to make significant improvements in function in a reasonable and predictable amount of time.     Precautions / Restrictions Precautions Precautions: Fall Recall of Precautions/Restrictions: Impaired Precaution/Restrictions Comments: patient appears to have some expressive aphasia; first state she uses AD then states she does note Restrictions Weight Bearing Restrictions Per Provider Order: No      Mobility  Bed Mobility Overal bed mobility: Needs Assistance Bed Mobility: Supine to Sit, Sit to Supine     Supine to sit: Min assist, HOB elevated, Used rails     General bed mobility comments: increased time, labored movement; moans a little with pain but unable to verbalize where she is hurting Patient Response: Cooperative  Transfers Overall transfer level: Needs assistance Equipment used: None Transfers: Sit to/from Stand, Bed to chair/wheelchair/BSC Sit to Stand: Mod assist, Min assist   Step pivot transfers: Min assist, Mod assist       General transfer comment: requires increased time, repeated attempts for completing sit to stands having to push off from bed rail or armrest of chair    Ambulation/Gait Ambulation/Gait assistance: Min assist, Mod assist Gait Distance (Feet): 2 Feet Assistive device: None Gait Pattern/deviations: Decreased step length - right, Decreased step length - left, Decreased stride length, Step-to pattern Gait velocity: decreased  General Gait Details: slightly labored cadence with mostly step-to pattern using hand held assist without loss of balance, limited mostly due to pain and fatigue  Stairs            Wheelchair  Mobility     Tilt Bed Tilt Bed Patient Response: Cooperative  Modified Rankin (Stroke Patients Only)       Balance Overall balance assessment: Needs assistance Sitting-balance support: Feet supported, No upper extremity supported Sitting balance-Leahy Scale: Fair Sitting balance - Comments: fair/good seated at EOB; feet wide apart on the floor   Standing balance support: During functional activity, Single extremity supported Standing balance-Leahy Scale: Fair Standing balance comment: fair/poor without AD; needs min to mod assist/hand held assist                             Pertinent Vitals/Pain Pain Assessment Pain Assessment: Faces Pain Score: 5  Negative Vocalization: occasional moan/groan, low speech, negative/disapproving quality Facial Expression: facial grimacing Pain Descriptors / Indicators: Moaning Pain Intervention(s): Limited activity within patient's tolerance, Repositioned, Monitored during session    Home Living Family/patient expects to be discharged to:: Private residence Living Arrangements: Alone Available Help at Discharge: Family;Available PRN/intermittently;Personal care attendant Type of Home: Apartment Home Access: Stairs to enter Entrance Stairs-Rails: None Entrance Stairs-Number of Steps: 3-4   Home Layout: One level Home Equipment: Agricultural consultant (2 wheels);Cane - single point;BSC/3in1;Shower seat Additional Comments: information obtained from chart review as patient has dysarthria and expressive aphasia    Prior Function Prior Level of Function : Needs assist       Physical Assist : Mobility (physical);ADLs (physical) Mobility (physical): Bed mobility;Transfers;Gait;Stairs   Mobility Comments: household ambulation using SPC ADLs Comments: Home aides during the day     Extremity/Trunk Assessment   Upper Extremity Assessment Upper Extremity Assessment: Defer to OT evaluation    Lower Extremity Assessment Lower  Extremity Assessment: Generalized weakness    Cervical / Trunk Assessment Cervical / Trunk Assessment: Normal  Communication   Communication Communication: Other (comment);Impaired (expressive aphasia, dysarthria) Factors Affecting Communication: Reduced clarity of speech;Difficulty expressing self    Cognition Arousal: Alert Behavior During Therapy: WFL for tasks assessed/performed                           PT - Cognition Comments: able to follow commands but has expressive aphasia; dysarthria Following commands: Intact       Cueing Cueing Techniques: Verbal cues, Tactile cues     General Comments      Exercises     Assessment/Plan    PT Assessment Patient needs continued PT services  PT Problem List Decreased strength;Decreased activity tolerance;Decreased balance;Decreased mobility       PT Treatment Interventions DME instruction;Gait training;Stair training;Functional mobility training;Therapeutic activities;Therapeutic exercise;Balance training;Patient/family education    PT Goals (Current goals can be found in the Care Plan section)  Acute Rehab PT Goals Patient Stated Goal: return home after rehab PT Goal Formulation: With patient Time For Goal Achievement: 10/09/23 Potential to Achieve Goals: Good    Frequency Min 3X/week     Co-evaluation               AM-PAC PT "6 Clicks" Mobility  Outcome Measure Help needed turning from your back to your side while in a flat bed without using bedrails?: A Little Help needed moving from lying on your back to sitting on the side of a  flat bed without using bedrails?: A Little Help needed moving to and from a bed to a chair (including a wheelchair)?: A Lot Help needed standing up from a chair using your arms (e.g., wheelchair or bedside chair)?: A Lot Help needed to walk in hospital room?: A Lot Help needed climbing 3-5 steps with a railing? : A Lot 6 Click Score: 14    End of Session Equipment  Utilized During Treatment: Gait belt Activity Tolerance: Patient tolerated treatment well;Patient limited by fatigue;Patient limited by pain Patient left: in chair;with call bell/phone within reach;with chair alarm set Nurse Communication: Mobility status PT Visit Diagnosis: Unsteadiness on feet (R26.81);Muscle weakness (generalized) (M62.81);Other abnormalities of gait and mobility (R26.89)    Time: 1345-1410 PT Time Calculation (min) (ACUTE ONLY): 25 min   Charges:   PT Evaluation $PT Eval Moderate Complexity: 1 Mod   PT General Charges $$ ACUTE PT VISIT: 1 Visit         2:36 PM, 09/25/23 Deuce Paternoster Small Hilman Kissling MPT [facility] [place] [phone] Ph:[phone]

## 2023-09-25 NOTE — Evaluation (Signed)
Occupational Therapy Evaluation Patient Details Name: Robin [name] MRN: [id number] DOB: 12-29-1965 Today's Date: 09/25/2023   History of Present Illness   Robin [name] is a 58 y.o. African-American female with medical history significant for chronic HFrEF, aortic stenosis, COPD, type 2 diabetes mellitus, chronic atrial fibrillation on Eliquis, essential hypertension, GERD, gout, and CVA, who presented to the ER with acute onset of altered mental status with reported fever with recent confusion and diminished appetite.  She did not have any reported nausea or vomiting or diarrhea or abdominal pain.  No chest pain or palpitations.  No reported cough or wheezing.  She was having a headache and mild neck pain.  No dysuria, oliguria or hematuria or flank pain.  The patient was very somnolent and therefore a poor historian. (per MD)     Clinical Impressions Pt agreeable to OT evaluation. Pt reports much ADL assist at baseline and SPC ambulation within the home. Pt required min to mod A for transfer to bed and a couple steps away from the bed. Pt unsteady with RW and unsafe. Pt noted to try and sit before this was safe and required more assist to back up to the bed. R UE limited from baseline stroke but able to grasp RW with set up assist. Pt confused as noted by thinking she was at home. Pt left in the bed with the bed alarm set and the call bell within reach. Pt will benefit from continued OT in the hospital and recommended venue below to increase strength, balance, and endurance for safe ADL's.        If plan is discharge home, recommend the following:   A lot of help with walking and/or transfers;A lot of help with bathing/dressing/bathroom;Assistance with cooking/housework;Assist for transportation;Help with stairs or ramp for entrance;Direct supervision/assist for medications management     Functional Status Assessment   Patient has had a recent decline in their functional status and  demonstrates the ability to make significant improvements in function in a reasonable and predictable amount of time.     Equipment Recommendations   None recommended by OT             Precautions/Restrictions   Precautions Precautions: Fall Recall of Precautions/Restrictions: Impaired Restrictions Weight Bearing Restrictions Per Provider Order: No     Mobility Bed Mobility Overal bed mobility: Needs Assistance Bed Mobility: Sit to Supine     Supine to sit: Contact guard     General bed mobility comments: Mild labored effort; able to position self in bed without physical assist.    Transfers Overall transfer level: Needs assistance Equipment used: Rolling walker (2 wheels) Transfers: Sit to/from Stand, Bed to chair/wheelchair/BSC Sit to Stand: Min assist, Contact guard assist     Step pivot transfers: Mod assist, Min assist     General transfer comment: Increased time; impulsive; cuing for safe  use of RW with pt attempting to move away from waker or sit before this action was safe.      Balance Overall balance assessment: Needs assistance Sitting-balance support: No upper extremity supported, Feet supported Sitting balance-Leahy Scale: Fair Sitting balance - Comments: fair/good seated at EOB   Standing balance support: During functional activity, Bilateral upper extremity supported Standing balance-Leahy Scale: Fair Standing balance comment: fair poor with mixed use of RW                           ADL either performed or assessed  with clinical judgement   ADL Overall ADL's : Needs assistance/impaired     Grooming: Moderate assistance;Sitting;Minimal assistance   Upper Body Bathing: Minimal assistance;Sitting;Moderate assistance   Lower Body Bathing: Moderate assistance;Maximal assistance;Sitting/lateral leans   Upper Body Dressing : Minimal assistance;Moderate assistance;Sitting   Lower Body Dressing: Moderate assistance;Maximal  assistance;Sitting/lateral leans   Toilet Transfer: Moderate assistance;Minimal assistance;Squat-pivot;Ambulation;Rolling walker (2 wheels) Toilet Transfer Details (indicate cue type and reason): Simualted via chair to EOB transfer with brief attempt at ambulation with RW. Toileting- Clothing Manipulation and Hygiene: Moderate assistance;Sitting/lateral lean       Functional mobility during ADLs: Moderate assistance;Rolling walker (2 wheels) General ADL Comments: Pt took ~2  steps away from the bed and back with RW.     Vision Baseline Vision/History: 0 No visual deficits Ability to See in Adequate Light: 0 Adequate Patient Visual Report: No change from baseline Vision Assessment?: No apparent visual deficits     Perception Perception: Not tested       Praxis Praxis: Not tested       Pertinent Vitals/Pain Pain Assessment Pain Assessment: No/denies pain     Extremity/Trunk Assessment Upper Extremity Assessment Upper Extremity Assessment: Left hand dominant;RUE deficits/detail RUE Deficits / Details: 2+/5 shoulder flexion; 3-/5 elbow extension; 3+/5 composit grip. Limitations reportedly from old stroke. RUE Coordination: decreased fine motor;decreased gross motor   Lower Extremity Assessment Lower Extremity Assessment: Defer to PT evaluation   Cervical / Trunk Assessment Cervical / Trunk Assessment: Normal   Communication Communication Communication: Impaired Factors Affecting Communication: Difficulty expressing self   Cognition Arousal: Alert Behavior During Therapy: Impulsive Cognition: No family/caregiver present to determine baseline             OT - Cognition Comments: Pt oriented to self; not oriented to place. Pt thought she was at home.                 Following commands: Intact       Cueing  General Comments   Cueing Techniques: Verbal cues;Tactile cues                 Home Living Family/patient expects to be discharged to::  Private residence Living Arrangements: Alone Available Help at Discharge: Family;Available PRN/intermittently;Personal care attendant Type of Home: Apartment Home Access: Stairs to enter Entrance Stairs-Number of Steps: 3-4 Entrance Stairs-Rails: None Home Layout: One level     Bathroom Shower/Tub: Chief Strategy Officer: Standard Bathroom Accessibility: Yes   Home Equipment: Agricultural consultant (2 wheels);Cane - single point;BSC/3in1;Shower seat   Additional Comments: per chart review  Lives With: Alone    Prior Functioning/Environment Prior Level of Function : Needs assist       Physical Assist : ADLs (physical) Mobility (physical): Bed mobility;Transfers;Gait;Stairs ADLs (physical): Bathing;Dressing;Grooming;IADLs;Toileting Mobility Comments: household ambulation using SPC ADLs Comments: Pt reports home aides helping with bathing, dressing, and seemingly grooming. Pt reports ability to complete toileting without assist.    OT Problem List: Decreased strength;Decreased range of motion;Decreased activity tolerance;Impaired balance (sitting and/or standing);Decreased coordination;Decreased cognition;Decreased safety awareness;Impaired UE functional use   OT Treatment/Interventions: Self-care/ADL training;Therapeutic exercise;DME and/or AE instruction;Therapeutic activities;Balance training;Patient/family education      OT Goals(Current goals can be found in the care plan section)   Acute Rehab OT Goals Patient Stated Goal: open to rehab to get stronger OT Goal Formulation: With patient Time For Goal Achievement: 10/09/23 Potential to Achieve Goals: Good   OT Frequency:  Min 2X/week  End of Session Equipment Utilized During Treatment: Rolling walker (2 wheels) Nurse Communication: Other (comment) (notified pt was assisted back to bed.)  Activity Tolerance: Patient tolerated treatment well Patient left: in  bed;with call bell/phone within reach;with bed alarm set  OT Visit Diagnosis: Unsteadiness on feet (R26.81);Other abnormalities of gait and mobility (R26.89);Muscle weakness (generalized) (M62.81);Other symptoms and signs involving cognitive function;Cognitive communication deficit (R41.841);Hemiplegia and hemiparesis Symptoms and signs involving cognitive functions:  (no acute infarct) Hemiplegia - Right/Left: Right Hemiplegia - dominant/non-dominant: Non-Dominant Hemiplegia - caused by:  (previous CVA)                Time: 1610-9604 OT Time Calculation (min): 20 min Charges:  OT General Charges $OT Visit: 1 Visit OT Evaluation $OT Eval Moderate Complexity: 1 Mod  Kiev Labrosse OT, MOT   [employer] 09/25/2023, 3:55 PM

## 2023-09-25 NOTE — Plan of Care (Signed)
  Problem: Acute Rehab OT Goals (only OT should resolve) Goal: Pt. Will Perform Grooming Flowsheets (Taken 09/25/2023 1601) Pt Will Perform Grooming:  with modified independence  sitting Goal: Pt. Will Perform Upper Body Dressing Flowsheets (Taken 09/25/2023 1601) Pt Will Perform Upper Body Dressing:  with set-up  sitting Goal: Pt. Will Perform Lower Body Dressing Flowsheets (Taken 09/25/2023 1601) Pt Will Perform Lower Body Dressing:  with min assist  sitting/lateral leans Goal: Pt. Will Transfer To Toilet Flowsheets (Taken 09/25/2023 1601) Pt Will Transfer to Toilet:  with supervision  with modified independence  ambulating Goal: Pt. Will Perform Toileting-Clothing Manipulation Flowsheets (Taken 09/25/2023 1601) Pt Will Perform Toileting - Clothing Manipulation and hygiene: with modified independence Goal: Pt/Caregiver Will Perform Home Exercise Program Flowsheets (Taken 09/25/2023 1601) Pt/caregiver will Perform Home Exercise Program:  Increased ROM  Increased strength  Right Upper extremity  Independently  Malone Admire OT, MOT

## 2023-09-25 NOTE — ED Notes (Signed)
Full bed change, clean gown and purewick in place. Pt resting at this time with call light in reach.

## 2023-09-25 NOTE — Plan of Care (Signed)
  Problem: Acute Rehab PT Goals(only PT should resolve) Goal: Pt Will Go Supine/Side To Sit Outcome: Progressing Flowsheets (Taken 09/25/2023 1438) Pt will go Supine/Side to Sit: with contact guard assist Goal: Patient Will Transfer Sit To/From Stand Outcome: Progressing Flowsheets (Taken 09/25/2023 1438) Patient will transfer sit to/from stand: with minimal assist Goal: Pt Will Transfer Bed To Chair/Chair To Bed Outcome: Progressing Flowsheets (Taken 09/25/2023 1438) Pt will Transfer Bed to Chair/Chair to Bed: with min assist Goal: Pt Will Ambulate Outcome: Progressing Flowsheets (Taken 09/25/2023 1438) Pt will Ambulate:  50 feet  with contact guard assist  with cane

## 2023-09-26 DIAGNOSIS — A419 Sepsis, unspecified organism: Secondary | ICD-10-CM | POA: Diagnosis not present

## 2023-09-26 LAB — CBC WITH DIFFERENTIAL/PLATELET
Abs Immature Granulocytes: 0.01 10*3/uL (ref 0.00–0.07)
Basophils Absolute: 0 10*3/uL (ref 0.0–0.1)
Basophils Relative: 1 %
Eosinophils Absolute: 0.5 10*3/uL (ref 0.0–0.5)
Eosinophils Relative: 8 %
HCT: 41.6 % (ref 36.0–46.0)
Hemoglobin: 13.3 g/dL (ref 12.0–15.0)
Immature Granulocytes: 0 %
Lymphocytes Relative: 30 %
Lymphs Abs: 1.9 10*3/uL (ref 0.7–4.0)
MCH: 27.5 pg (ref 26.0–34.0)
MCHC: 32 g/dL (ref 30.0–36.0)
MCV: 86.1 fL (ref 80.0–100.0)
Monocytes Absolute: 1 10*3/uL (ref 0.1–1.0)
Monocytes Relative: 16 %
Neutro Abs: 2.8 10*3/uL (ref 1.7–7.7)
Neutrophils Relative %: 45 %
Platelets: 201 10*3/uL (ref 150–400)
RBC: 4.83 MIL/uL (ref 3.87–5.11)
RDW: 14.9 % (ref 11.5–15.5)
WBC: 6.2 10*3/uL (ref 4.0–10.5)
nRBC: 0 % (ref 0.0–0.2)

## 2023-09-26 LAB — COMPREHENSIVE METABOLIC PANEL
ALT: 11 U/L (ref 0–44)
AST: 16 U/L (ref 15–41)
Albumin: 3.3 g/dL — ABNORMAL LOW (ref 3.5–5.0)
Alkaline Phosphatase: 68 U/L (ref 38–126)
Anion gap: 8 (ref 5–15)
BUN: 20 mg/dL (ref 6–20)
CO2: 23 mmol/L (ref 22–32)
Calcium: 8.8 mg/dL — ABNORMAL LOW (ref 8.9–10.3)
Chloride: 106 mmol/L (ref 98–111)
Creatinine, Ser: 1.48 mg/dL — ABNORMAL HIGH (ref 0.44–1.00)
GFR, Estimated: 41 mL/min — ABNORMAL LOW (ref >60)
Glucose, Bld: 91 mg/dL (ref 70–99)
Potassium: 3.6 mmol/L (ref 3.5–5.1)
Sodium: 137 mmol/L (ref 135–145)
Total Bilirubin: 0.6 mg/dL (ref 0.0–1.2)
Total Protein: 6.8 g/dL (ref 6.5–8.1)

## 2023-09-26 LAB — PHOSPHORUS: Phosphorus: 2.1 mg/dL — ABNORMAL LOW (ref 2.5–4.6)

## 2023-09-26 LAB — MAGNESIUM: Magnesium: 1.9 mg/dL (ref 1.7–2.4)

## 2023-09-26 NOTE — TOC Progression Note (Signed)
Transition of Care [facility]) - Progression Note    Patient Details  Name: Robin [name] MRN: [id number] Date of Birth: 09-Dec-1965  Transition of Care [facility] CM/SW Contact  Isabella Bowens, [place] Phone Number: 09/26/2023, 10:11 AM  Clinical Narrative:     CSW reached out to [facility] with [facility] regarding resuming services for PT/OT/SLP. Victorino Dike shared that she is able to accept back. CSW will continue to follow.  Expected Discharge Plan: Home w Home Health Services Barriers to Discharge: Continued Medical Work up  Expected Discharge Plan and Services In-house Referral: Clinical Social Work     Living arrangements for the past 2 months: Apartment   Social Determinants of Health (SDOH) Interventions SDOH Screenings   Food Insecurity: No Food Insecurity (09/24/2023)  Housing: Low Risk  (09/24/2023)  Transportation Needs: No Transportation Needs (09/24/2023)  Utilities: Not At Risk (09/24/2023)  Depression (PHQ2-9): Low Risk  (02/05/2023)  Tobacco Use: Medium Risk (09/25/2023)  Health Literacy: Medium Risk (09/18/2023)   Received from [facility]    Readmission Risk Interventions    09/26/2023   10:11 AM 09/24/2023    2:14 PM 09/08/2023    9:27 AM  Readmission Risk Prevention Plan  Transportation Screening Complete Complete Complete  HRI or Home Care Consult   Complete  Social Work Consult for Recovery Care Planning/Counseling   Complete  Palliative Care Screening   Not Applicable  Medication Review Oceanographer) Complete Complete Complete  HRI or Home Care Consult Complete Complete   SW Recovery Care/Counseling Consult Complete Complete   Palliative Care Screening Not Applicable Not Applicable   Skilled Nursing Facility Not Applicable Not Applicable

## 2023-09-26 NOTE — Plan of Care (Signed)
  Problem: Activity: Goal: Risk for activity intolerance will decrease Outcome: Progressing   Problem: Pain Managment: Goal: General experience of comfort will improve and/or be controlled Outcome: Progressing

## 2023-09-26 NOTE — Care Management Important Message (Signed)
Important Message  Patient Details  Name: Robin [name] MRN: [id number] Date of Birth: 09-02-1965   Important Message Given:  Yes - Medicare IM (reviewed letter verbally with sister [name] [address]ty [address] at [phone])     Corey Harold 09/26/2023, 3:56 PM

## 2023-09-26 NOTE — Progress Notes (Addendum)
PROGRESS NOTE    Robin [name]  ZOX:[id number] DOB: 11-28-1965 DOA: 09/23/2023 PCP: Jarold Motto, PA  Chief Complaint  Patient presents with   Altered Mental Status    Hospital Course:  Robin [name] is 58 y.o. female with heart failure with reduced EF, aortic stenosis, COPD, type 2 diabetes, chronic atrial fibrillation on Eliquis, hypertension, GERD, gout, history of CVA, who presented to the ED with altered mental status and fever.  Patient also endorsed recent confusion and diminished appetite, without GI upset.  In the ED patient met SIRS criteria, febrile to 102, tachycardic to 120.  Labs revealed mild hyponatremia and creatinine 1.74.  Urinalysis is glucose but otherwise unremarkable.  EKG revealed A-fib with RVR.  Chest x-ray with vascular congestion.  Patient was given Tylenol, cefepime, Flagyl, vancomycin and 1 L NS bolus.  She was admitted for medical telemetry. Note patient was recently admitted for similar presentation and during that admission was found to have new focal seizures.  She was evaluated by neurology and started on Keppra 250 twice daily.  She was discharged directly to skilled nursing facility.  Pt was discharged from SNF on 2/13 and returned home independently with the assistance of caregivers.  Subjective: No acute events overnight.  On evaluation this morning patient is much more alert than prior exams.  She is able to follow some commands, still struggles to answer questions but improved.  Her 2 sons and brother are at bedside.  They report that she still appears weaker beyond her baseline but agree that her mental status is much improved since admission.   Objective: Vitals:   09/25/23 2039 09/25/23 2325 09/26/23 0527 09/26/23 1351  BP: 114/76 110/74 122/83 132/80  Pulse: 85 78 82 79  Resp: 20  17   Temp: 98 F (36.7 C) 98.1 F (36.7 C) 98.4 F (36.9 C) 97.6 F (36.4 C)  TempSrc: Oral Oral  Oral  SpO2: 100% 99% 97% 96%  Weight:      Height:         Intake/Output Summary (Last 24 hours) at 09/26/2023 1549 Last data filed at 09/26/2023 1300 Gross per 24 hour  Intake 600 ml  Output 1700 ml  Net -1100 ml   Filed Weights   09/24/23 0100  Weight: 108.9 kg    Examination: General exam: Appears calm and comfortable, NAD Respiratory system: No work of breathing, symmetric chest wall expansion Cardiovascular system: S1 & S2 heard, RRR.  Gastrointestinal system: Abdomen is nondistended, soft and nontender.  Neuro: Alert, oriented to self and place, requires prompting to answer questions but follows all commands Extremities: Symmetric, expected ROM Skin: No rashes, lesions Psychiatry: Calm, mood and affect congruent  Assessment & Plan:  Principal Problem:   Sepsis due to undetermined organism [facility]) Active Problems:   Chronic atrial fibrillation with RVR (HCC)   Type 2 diabetes mellitus with chronic kidney disease, with long-term current use of insulin (HCC)   Essential hypertension   Diabetic neuropathy (HCC)   Seizure disorder (HCC)    Sepsis secondary to viral infection - PCR positive for coronavirus NL63 - No leukocytosis, cortisol within normal limits. -- Influenza/RSV/COVID negative - UA unremarkable - Chest x-ray with vascular congestion - Blood cultures taken, Negative to date. - CT chest abdomen pelvis: Without any acute abnormality -- Suspect this is all secondary to viral infection, procalcitonin is unremarkable.  Have discontinued further antibiotics  Altered mental status History of CVA - This is likely multifactorial, suspect most is secondary to  generalized deconditioning combined with acute viral infection.  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  --   Family reports history of aneurysms with attempted clipping in the past. -- Repeat Head CT this admission is unremarkable.  - PT/OT evals ordered - will need HH at discharge - Baseline: Patient lives independently, has caregivers from 9-1 daily, is able to ambulate and prepare meals, does have some dysarthria at baseline.  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 Disposition: Inpatient, still hospitalized due to altered mentation.  Appears she is slowly resolving.  Will likely discharge back to home with home health tomorrow  Consultants:    Procedures:    Antimicrobials:  Anti-infectives (From admission, onward)    Start     Dose/Rate Route Frequency Ordered Stop   09/24/23 1000  metroNIDAZOLE (FLAGYL) IVPB 500 mg  Status:  Discontinued        500 mg 100 mL/hr over 60 Minutes Intravenous Every 12 hours 09/24/23 0150 09/24/23 1454   09/24/23 1000  vancomycin (VANCOREADY) IVPB 750 mg/150 mL  Status:  Discontinued        750 mg 150 mL/hr over 60 Minutes Intravenous Every 24 hours 09/24/23 0203 09/24/23 1454   09/24/23 0800  ceFEPIme (MAXIPIME) 2 g in sodium chloride 0.9 % 100 mL IVPB  Status:  Discontinued         2 g 200 mL/hr over 30 Minutes Intravenous Every 12 hours 09/24/23 0203 09/24/23 1454   09/24/23 0200  ceFEPIme (MAXIPIME) 2 g in sodium chloride 0.9 % 100 mL IVPB  Status:  Discontinued        2 g 200 mL/hr over 30 Minutes Intravenous  Once 09/24/23 0150 09/24/23 0152   09/24/23 0200  vancomycin (VANCOCIN) IVPB 1000 mg/200 mL premix  Status:  Discontinued        1,000 mg 200 mL/hr over 60 Minutes Intravenous  Once 09/24/23 0150 09/24/23 0152   09/23/23 2045  ceFEPIme (MAXIPIME) 2 g in sodium chloride 0.9 % 100 mL IVPB        2 g 200 mL/hr over 30 Minutes Intravenous  Once 09/23/23 2042 09/23/23 2230   09/23/23 2045  metroNIDAZOLE (FLAGYL) IVPB 500 mg        500 mg 100 mL/hr over 60 Minutes Intravenous  Once 09/23/23 2042 09/23/23 2330   09/23/23 2045  vancomycin (VANCOCIN) IVPB 1000 mg/200 mL premix        1,000 mg 200 mL/hr over 60 Minutes Intravenous  Once 09/23/23 2042 09/23/23 2329  Data Reviewed: I have personally reviewed following labs and imaging studies CBC: Recent Labs  Lab 09/23/23 2105 09/24/23 0248 09/25/23 0427 09/26/23 0425  WBC 6.0 6.5 5.3 6.2  NEUTROABS 4.2  --  2.7 2.8  HGB 14.6 14.6 14.0 13.3  HCT 46.5* 46.7* 44.1 41.6  MCV 85.3 86.2 85.6 86.1  PLT 257 238 207 201   Basic Metabolic Panel: Recent Labs  Lab 09/23/23 2105 09/24/23 0248 09/25/23 0427 09/26/23 0425  NA 134* 137 135 137  K 3.9 4.0 3.6 3.6  CL 105 107 106 106  CO2 19* 18* 20* 23  GLUCOSE 117* 129* 90 91  BUN 23* 22* 18 20  CREATININE 1.74* 1.64* 1.40* 1.48*  CALCIUM 8.6* 8.6* 8.4* 8.8*  MG  --   --  1.8 1.9  PHOS  --   --  2.0* 2.1*   GFR: Estimated Creatinine Clearance: 53.3 mL/min (A) (by C-G formula based on SCr of 1.48 mg/dL (H)). Liver Function Tests: Recent Labs  Lab 09/23/23 2105 09/25/23 0427 09/26/23 0425  AST 18 18 16   ALT 16 14 11   ALKPHOS 83 67 68  BILITOT 1.1 1.2 0.6  PROT 7.6 6.9 6.8  ALBUMIN 3.8 3.4* 3.3*   CBG: Recent Labs  Lab 09/24/23 0929  09/25/23 1149 09/25/23 1627 09/25/23 2042  GLUCAP 93 107* 106* 126*    Recent Results (from the past 240 hours)  Resp panel by RT-PCR (RSV, Flu A&B, Covid) Anterior Nasal Swab     Status: None   Collection Time: 09/23/23  8:50 PM   Specimen: Anterior Nasal Swab  Result Value Ref Range Status   SARS Coronavirus 2 by RT PCR NEGATIVE NEGATIVE Final    Comment: (NOTE) SARS-CoV-2 target nucleic acids are NOT DETECTED.  The SARS-CoV-2 RNA is generally detectable in upper respiratory specimens during the acute phase of infection. The lowest concentration of SARS-CoV-2 viral copies this assay can detect is 138 copies/mL. A negative result does not preclude SARS-Cov-2 infection and should not be used as the sole basis for treatment or other patient management decisions. A negative result may occur with  improper specimen collection/handling, submission of specimen other than nasopharyngeal swab, presence of viral mutation(s) within the areas targeted by this assay, and inadequate number of viral copies(<138 copies/mL). A negative result must be combined with clinical observations, patient history, and epidemiological information. The expected result is Negative.  Fact Sheet for Patients:  BloggerCourse.com  Fact Sheet for Healthcare Providers:  SeriousBroker.it  This test is no t yet approved or cleared by the [place] FDA and  has been authorized for detection and/or diagnosis of SARS-CoV-2 by FDA under an Emergency Use Authorization (EUA). This EUA will remain  in effect (meaning this test can be used) for the duration of the COVID-19 declaration under Section 564(b)(1) of the Act, 21 U.S.C.section 360bbb-3(b)(1), unless the authorization is terminated  or revoked sooner.       Influenza A by PCR NEGATIVE NEGATIVE Final   Influenza B by PCR NEGATIVE NEGATIVE Final    Comment: (NOTE) The Xpert Xpress SARS-CoV-2/FLU/RSV plus  assay is intended as an aid in the diagnosis of influenza from Nasopharyngeal swab specimens and should not be used as a sole basis for treatment. Nasal washings and aspirates are unacceptable for Xpert Xpress SARS-CoV-2/FLU/RSV testing.  Fact Sheet for Patients: BloggerCourse.com  Fact Sheet for Healthcare Providers: SeriousBroker.it  This test is not yet approved or cleared by the [place] and has been authorized for  detection and/or diagnosis of SARS-CoV-2 by FDA under an Emergency Use Authorization (EUA). This EUA will remain in effect (meaning this test can be used) for the duration of the COVID-19 declaration under Section 564(b)(1) of the Act, 21 U.S.C. section 360bbb-3(b)(1), unless the authorization is terminated or revoked.     Resp Syncytial Virus by PCR NEGATIVE NEGATIVE Final    Comment: (NOTE) Fact Sheet for Patients: BloggerCourse.com  Fact Sheet for Healthcare Providers: SeriousBroker.it  This test is not yet approved or cleared by the [place] FDA and has been authorized for detection and/or diagnosis of SARS-CoV-2 by FDA under an Emergency Use Authorization (EUA). This EUA will remain in effect (meaning this test can be used) for the duration of the COVID-19 declaration under Section 564(b)(1) of the Act, 21 U.S.C. section 360bbb-3(b)(1), unless the authorization is terminated or revoked.  Performed at [facility], [address], [place], [name] [address]   Blood Culture (routine x 2)     Status: None (Preliminary result)   Collection Time: 09/23/23  9:05 PM   Specimen: BLOOD RIGHT FOREARM  Result Value Ref Range Status   Specimen Description   Final    BLOOD RIGHT FOREARM BOTTLES DRAWN AEROBIC AND ANAEROBIC   Special Requests   Final    Blood Culture results may not be optimal due to an inadequate volume of blood received in culture bottles    Culture   Final    NO GROWTH 3 DAYS Performed at [facility], [address], [place], [name] [address]    Report Status PENDING  Incomplete  Blood Culture (routine x 2)     Status: None (Preliminary result)   Collection Time: 09/23/23  9:05 PM   Specimen: Left Antecubital; Blood  Result Value Ref Range Status   Specimen Description   Final    LEFT ANTECUBITAL BOTTLES DRAWN AEROBIC AND ANAEROBIC   Special Requests Blood Culture adequate volume  Final   Culture   Final    NO GROWTH 3 DAYS Performed at [facility], [address], [place], [name] [address]    Report Status PENDING  Incomplete  Respiratory (~20 pathogens) panel by PCR     Status: Abnormal   Collection Time: 09/24/23  6:00 AM   Specimen: Nasopharyngeal Swab; Respiratory  Result Value Ref Range Status   Adenovirus NOT DETECTED NOT DETECTED Final   Coronavirus 229E NOT DETECTED NOT DETECTED Final    Comment: (NOTE) The Coronavirus on the Respiratory Panel, DOES NOT test for the novel  Coronavirus (2019 nCoV)    Coronavirus HKU1 NOT DETECTED NOT DETECTED Final   Coronavirus NL63 DETECTED (A) NOT DETECTED Final   Coronavirus OC43 NOT DETECTED NOT DETECTED Final   Metapneumovirus NOT DETECTED NOT DETECTED Final   Rhinovirus / Enterovirus NOT DETECTED NOT DETECTED Final   Influenza A NOT DETECTED NOT DETECTED Final   Influenza B NOT DETECTED NOT DETECTED Final   Parainfluenza Virus 1 NOT DETECTED NOT DETECTED Final   Parainfluenza Virus 2 NOT DETECTED NOT DETECTED Final   Parainfluenza Virus 3 NOT DETECTED NOT DETECTED Final   Parainfluenza Virus 4 NOT DETECTED NOT DETECTED Final   Respiratory Syncytial Virus NOT DETECTED NOT DETECTED Final   Bordetella pertussis NOT DETECTED NOT DETECTED Final   Bordetella Parapertussis NOT DETECTED NOT DETECTED Final   Chlamydophila pneumoniae NOT DETECTED NOT DETECTED Final   Mycoplasma pneumoniae NOT DETECTED NOT DETECTED Final    Comment: Performed at [facility], [address] [place], [name] [address]     Radiology Studies: No results found.   Scheduled Meds:  apixaban  5 mg Oral BID   carvedilol  25 mg Oral BID WC   dicyclomine  20 mg Oral TID AC   empagliflozin  25 mg Oral QAC breakfast   ferrous sulfate  325 mg Oral Daily   furosemide  20 mg Oral Daily   gabapentin  200 mg Oral TID   hydrALAZINE  25 mg Oral TID   insulin glargine-yfgn  44 Units Subcutaneous Daily   isosorbide mononitrate  60 mg Oral Daily   levETIRAcetam  250 mg Oral BID   timolol  1 drop Both Eyes BID   tobramycin  1 drop Both Eyes QID   Continuous Infusions:     LOS: 3 days    Total time spent coordinating care:   Debarah Crape, DO [facility]  To contact the attending physician between 7A-7P please use Epic Chat. To contact the covering physician during after hours 7P-7A, please review Amion.   09/26/2023, 3:49 PM   *This document has been created with the assistance of dictation software. Please excuse typographical errors. *

## 2023-09-27 DIAGNOSIS — A419 Sepsis, unspecified organism: Secondary | ICD-10-CM | POA: Diagnosis not present

## 2023-09-27 LAB — COMPREHENSIVE METABOLIC PANEL
ALT: 12 U/L (ref 0–44)
AST: 15 U/L (ref 15–41)
Albumin: 3.3 g/dL — ABNORMAL LOW (ref 3.5–5.0)
Alkaline Phosphatase: 67 U/L (ref 38–126)
Anion gap: 8 (ref 5–15)
BUN: 20 mg/dL (ref 6–20)
CO2: 21 mmol/L — ABNORMAL LOW (ref 22–32)
Calcium: 8.8 mg/dL — ABNORMAL LOW (ref 8.9–10.3)
Chloride: 109 mmol/L (ref 98–111)
Creatinine, Ser: 1.41 mg/dL — ABNORMAL HIGH (ref 0.44–1.00)
GFR, Estimated: 44 mL/min — ABNORMAL LOW (ref >60)
Glucose, Bld: 87 mg/dL (ref 70–99)
Potassium: 3.5 mmol/L (ref 3.5–5.1)
Sodium: 138 mmol/L (ref 135–145)
Total Bilirubin: 0.7 mg/dL (ref 0.0–1.2)
Total Protein: 6.7 g/dL (ref 6.5–8.1)

## 2023-09-27 LAB — CBC WITH DIFFERENTIAL/PLATELET
Abs Immature Granulocytes: 0.01 10*3/uL (ref 0.00–0.07)
Basophils Absolute: 0 10*3/uL (ref 0.0–0.1)
Basophils Relative: 1 %
Eosinophils Absolute: 0.4 10*3/uL (ref 0.0–0.5)
Eosinophils Relative: 8 %
HCT: 42.5 % (ref 36.0–46.0)
Hemoglobin: 13.2 g/dL (ref 12.0–15.0)
Immature Granulocytes: 0 %
Lymphocytes Relative: 43 %
Lymphs Abs: 2.2 10*3/uL (ref 0.7–4.0)
MCH: 26.5 pg (ref 26.0–34.0)
MCHC: 31.1 g/dL (ref 30.0–36.0)
MCV: 85.3 fL (ref 80.0–100.0)
Monocytes Absolute: 0.6 10*3/uL (ref 0.1–1.0)
Monocytes Relative: 12 %
Neutro Abs: 1.9 10*3/uL (ref 1.7–7.7)
Neutrophils Relative %: 36 %
Platelets: 207 10*3/uL (ref 150–400)
RBC: 4.98 MIL/uL (ref 3.87–5.11)
RDW: 14.6 % (ref 11.5–15.5)
WBC: 5.2 10*3/uL (ref 4.0–10.5)
nRBC: 0 % (ref 0.0–0.2)

## 2023-09-27 LAB — MAGNESIUM: Magnesium: 2.2 mg/dL (ref 1.7–2.4)

## 2023-09-27 LAB — PHOSPHORUS: Phosphorus: 2.6 mg/dL (ref 2.5–4.6)

## 2023-09-27 NOTE — Progress Notes (Signed)
 PROGRESS NOTE    Robin [name]  VFI:[id number] DOB: 1965/08/21 DOA: 09/23/2023 PCP: Jarold Motto, PA  Chief Complaint  Patient presents with   Altered Mental Status    Hospital Course:  Robin [name] is 58 y.o. female with heart failure with reduced EF, aortic stenosis, COPD, type 2 diabetes, chronic atrial fibrillation on Eliquis, hypertension, GERD, gout, history of CVA, who presented to the ED with altered mental status and fever.  Patient also endorsed recent confusion and diminished appetite, without GI upset.  In the ED patient met SIRS criteria, febrile to 102, tachycardic to 120.  Labs revealed mild hyponatremia and creatinine 1.74.  Urinalysis is glucose but otherwise unremarkable.  EKG revealed A-fib with RVR.  Chest x-ray with vascular congestion.  Patient was given Tylenol, cefepime, Flagyl, vancomycin and 1 L NS bolus.  She was admitted for medical telemetry. Note patient was recently admitted for similar presentation and during that admission was found to have new focal seizures.  She was evaluated by neurology and started on Keppra 250 twice daily.  She was discharged directly to skilled nursing facility.  Pt was discharged from SNF on 2/13 and returned home independently with the assistance of caregivers.  Subjective: No acute events overnight.  Pt is alert and talkative this AM. She does not want to get out of bed.   Objective: Vitals:   09/26/23 2003 09/27/23 0428 09/27/23 0836 09/27/23 0900  BP: 101/68 (!) 144/83 135/89 107/76  Pulse: 71 72 82 90  Resp: 20 20    Temp: 97.6 F (36.4 C) 97.6 F (36.4 C) 98.1 F (36.7 C) 98.2 F (36.8 C)  TempSrc: Oral Oral Oral Oral  SpO2: 98% 100% 100% 100%  Weight:      Height:        Intake/Output Summary (Last 24 hours) at 09/27/2023 1254 Last data filed at 09/27/2023 4166 Gross per 24 hour  Intake 840 ml  Output 1950 ml  Net -1110 ml   Filed Weights   09/24/23 0100  Weight: 108.9 kg    Examination: General  exam: Appears calm and comfortable, NAD Respiratory system: No work of breathing, symmetric chest wall expansion Cardiovascular system: S1 & S2 heard, RRR.  Gastrointestinal system: Abdomen is nondistended, soft and nontender.  Neuro: Alert, oriented to self and place, follows all commands Extremities: Symmetric, expected ROM Skin: No rashes, lesions Psychiatry: Calm, mood and affect congruent  Assessment & Plan:  Principal Problem:   Sepsis due to undetermined organism [facility]) Active Problems:   Chronic atrial fibrillation with RVR (HCC)   Type 2 diabetes mellitus with chronic kidney disease, with long-term current use of insulin (HCC)   Essential hypertension   Diabetic neuropathy (HCC)   Seizure disorder (HCC)    Sepsis secondary to viral infection - PCR positive for coronavirus NL63 - No leukocytosis, cortisol within normal limits. -- Influenza/RSV/COVID negative - UA unremarkable - Chest x-ray with vascular congestion - Blood cultures taken, Negative to date. - CT chest abdomen pelvis: Without any acute abnormality -- Suspect this is all secondary to viral infection, procalcitonin is unremarkable.  Have discontinued further antibiotics  Altered mental status History of CVA - This is likely multifactorial, suspect most is secondary to generalized deconditioning combined with acute viral infection.  Appears to be back at mental status baseline but far from physical baseline. -  thiamine levels pending, B12, RPR, HIV, ammonia WNL -- CK WNL - Patient had recent brain MRI on prior admission 2 weeks ago which  revealed chronic left MCA infarct as well as a hypoplastic distal left vertebral artery with absent flow suggesting possible occlusion.  --  Family reports history of aneurysms with attempted clipping in the past. -- Repeat Head CT this admission is unremarkable.  - PT/OT evals ordered - will need HH at discharge - Baseline: Patient lives independently, has caregivers from  9-1 daily, is able to ambulate and prepare meals, does have some dysarthria at baseline.  Chronic atrial fibrillation with RVR, resolved - Continue Coreg - IV Cardizem as needed - Continue home dose Eliquis - Suspect RVR is triggered by acute infection  Type 2 diabetes with chronic kidney disease, with long-term use of insulin - Associated stage IIIb CKD, stable - Continue basal/bolus/sliding scale insulin - Continue Jardiance  Seizure disorder - Continue Keppra  Diabetic neuropathy - Continue Neurontin  Hypertension - Continue home dose antihypertensives  Mild prominence of ascending aorta - 4 cm on CT - Annual follow-up recommended by CTA or MRA  BMI:  37.59 kg/m  Obesity class II - Outpatient follow up for lifestyle modification and risk factor management  DVT prophylaxis: Eliquis   Code Status: Full Code Family Communication: Discussed directly with the patient Disposition: Inpatient, still hospitalized due weakness and difficulty with ambulation. Will discharge home with [facility] tomorrow if patient demonstrates ability to ambulate consistent with baseline.  Consultants:    Procedures:    Antimicrobials:  Anti-infectives (From admission, onward)    Start     Dose/Rate Route Frequency Ordered Stop   09/24/23 1000  metroNIDAZOLE (FLAGYL) IVPB 500 mg  Status:  Discontinued        500 mg 100 mL/hr over 60 Minutes Intravenous Every 12 hours 09/24/23 0150 09/24/23 1454   09/24/23 1000  vancomycin (VANCOREADY) IVPB 750 mg/150 mL  Status:  Discontinued        750 mg 150 mL/hr over 60 Minutes Intravenous Every 24 hours 09/24/23 0203 09/24/23 1454   09/24/23 0800  ceFEPIme (MAXIPIME) 2 g in sodium chloride 0.9 % 100 mL IVPB  Status:  Discontinued        2 g 200 mL/hr over 30 Minutes Intravenous Every 12 hours 09/24/23 0203 09/24/23 1454   09/24/23 0200  ceFEPIme (MAXIPIME) 2 g in sodium chloride 0.9 % 100 mL IVPB  Status:  Discontinued        2 g 200 mL/hr over 30 Minutes  Intravenous  Once 09/24/23 0150 09/24/23 0152   09/24/23 0200  vancomycin (VANCOCIN) IVPB 1000 mg/200 mL premix  Status:  Discontinued        1,000 mg 200 mL/hr over 60 Minutes Intravenous  Once 09/24/23 0150 09/24/23 0152   09/23/23 2045  ceFEPIme (MAXIPIME) 2 g in sodium chloride 0.9 % 100 mL IVPB        2 g 200 mL/hr over 30 Minutes Intravenous  Once 09/23/23 2042 09/23/23 2230   09/23/23 2045  metroNIDAZOLE (FLAGYL) IVPB 500 mg        500 mg 100 mL/hr over 60 Minutes Intravenous  Once 09/23/23 2042 09/23/23 2330   09/23/23 2045  vancomycin (VANCOCIN) IVPB 1000 mg/200 mL premix        1,000 mg 200 mL/hr over 60 Minutes Intravenous  Once 09/23/23 2042 09/23/23 2329       Data Reviewed: I have personally reviewed following labs and imaging studies CBC: Recent Labs  Lab 09/23/23 2105 09/24/23 0248 09/25/23 0427 09/26/23 0425 09/27/23 0310  WBC 6.0 6.5 5.3 6.2 5.2  NEUTROABS 4.2  --  2.7 2.8 1.9  HGB 14.6 14.6 14.0 13.3 13.2  HCT 46.5* 46.7* 44.1 41.6 42.5  MCV 85.3 86.2 85.6 86.1 85.3  PLT 257 238 207 201 207   Basic Metabolic Panel: Recent Labs  Lab 09/23/23 2105 09/24/23 0248 09/25/23 0427 09/26/23 0425 09/27/23 0310  NA 134* 137 135 137 138  K 3.9 4.0 3.6 3.6 3.5  CL 105 107 106 106 109  CO2 19* 18* 20* 23 21*  GLUCOSE 117* 129* 90 91 87  BUN 23* 22* 18 20 20   CREATININE 1.74* 1.64* 1.40* 1.48* 1.41*  CALCIUM 8.6* 8.6* 8.4* 8.8* 8.8*  MG  --   --  1.8 1.9 2.2  PHOS  --   --  2.0* 2.1* 2.6   GFR: Estimated Creatinine Clearance: 55.9 mL/min (A) (by C-G formula based on SCr of 1.41 mg/dL (H)). Liver Function Tests: Recent Labs  Lab 09/23/23 2105 09/25/23 0427 09/26/23 0425 09/27/23 0310  AST 18 18 16 15   ALT 16 14 11 12   ALKPHOS 83 67 68 67  BILITOT 1.1 1.2 0.6 0.7  PROT 7.6 6.9 6.8 6.7  ALBUMIN 3.8 3.4* 3.3* 3.3*   CBG: Recent Labs  Lab 09/24/23 0929 09/25/23 1149 09/25/23 1627 09/25/23 2042  GLUCAP 93 107* 106* 126*    Recent Results  (from the past 240 hours)  Resp panel by RT-PCR (RSV, Flu A&B, Covid) Anterior Nasal Swab     Status: None   Collection Time: 09/23/23  8:50 PM   Specimen: Anterior Nasal Swab  Result Value Ref Range Status   SARS Coronavirus 2 by RT PCR NEGATIVE NEGATIVE Final    Comment: (NOTE) SARS-CoV-2 target nucleic acids are NOT DETECTED.  The SARS-CoV-2 RNA is generally detectable in upper respiratory specimens during the acute phase of infection. The lowest concentration of SARS-CoV-2 viral copies this assay can detect is 138 copies/mL. A negative result does not preclude SARS-Cov-2 infection and should not be used as the sole basis for treatment or other patient management decisions. A negative result may occur with  improper specimen collection/handling, submission of specimen other than nasopharyngeal swab, presence of viral mutation(s) within the areas targeted by this assay, and inadequate number of viral copies(<138 copies/mL). A negative result must be combined with clinical observations, patient history, and epidemiological information. The expected result is Negative.  Fact Sheet for Patients:  BloggerCourse.com  Fact Sheet for Healthcare Providers:  SeriousBroker.it  This test is no t yet approved or cleared by the [place] FDA and  has been authorized for detection and/or diagnosis of SARS-CoV-2 by FDA under an Emergency Use Authorization (EUA). This EUA will remain  in effect (meaning this test can be used) for the duration of the COVID-19 declaration under Section 564(b)(1) of the Act, 21 U.S.C.section 360bbb-3(b)(1), unless the authorization is terminated  or revoked sooner.       Influenza A by PCR NEGATIVE NEGATIVE Final   Influenza B by PCR NEGATIVE NEGATIVE Final    Comment: (NOTE) The Xpert Xpress SARS-CoV-2/FLU/RSV plus assay is intended as an aid in the diagnosis of influenza from Nasopharyngeal swab  specimens and should not be used as a sole basis for treatment. Nasal washings and aspirates are unacceptable for Xpert Xpress SARS-CoV-2/FLU/RSV testing.  Fact Sheet for Patients: BloggerCourse.com  Fact Sheet for Healthcare Providers: SeriousBroker.it  This test is not yet approved or cleared by the [place] FDA and has been authorized for detection and/or diagnosis of SARS-CoV-2 by FDA under an Emergency Use  Authorization (EUA). This EUA will remain in effect (meaning this test can be used) for the duration of the COVID-19 declaration under Section 564(b)(1) of the Act, 21 U.S.C. section 360bbb-3(b)(1), unless the authorization is terminated or revoked.     Resp Syncytial Virus by PCR NEGATIVE NEGATIVE Final    Comment: (NOTE) Fact Sheet for Patients: BloggerCourse.com  Fact Sheet for Healthcare Providers: SeriousBroker.it  This test is not yet approved or cleared by the [place] FDA and has been authorized for detection and/or diagnosis of SARS-CoV-2 by FDA under an Emergency Use Authorization (EUA). This EUA will remain in effect (meaning this test can be used) for the duration of the COVID-19 declaration under Section 564(b)(1) of the Act, 21 U.S.C. section 360bbb-3(b)(1), unless the authorization is terminated or revoked.  Performed at [facility], [address], [place], [name] [address]   Blood Culture (routine x 2)     Status: None (Preliminary result)   Collection Time: 09/23/23  9:05 PM   Specimen: BLOOD RIGHT FOREARM  Result Value Ref Range Status   Specimen Description   Final    BLOOD RIGHT FOREARM BOTTLES DRAWN AEROBIC AND ANAEROBIC   Special Requests   Final    Blood Culture results may not be optimal due to an inadequate volume of blood received in culture bottles   Culture   Final    NO GROWTH 4 DAYS Performed at [facility], [address], [place], [name] [address]    Report Status PENDING  Incomplete  Blood Culture (routine x 2)     Status: None (Preliminary result)   Collection Time: 09/23/23  9:05 PM   Specimen: Left Antecubital; Blood  Result Value Ref Range Status   Specimen Description   Final    LEFT ANTECUBITAL BOTTLES DRAWN AEROBIC AND ANAEROBIC   Special Requests Blood Culture adequate volume  Final   Culture   Final    NO GROWTH 4 DAYS Performed at [facility], [address], [place], [name] [address]    Report Status PENDING  Incomplete  Respiratory (~20 pathogens) panel by PCR     Status: Abnormal   Collection Time: 09/24/23  6:00 AM   Specimen: Nasopharyngeal Swab; Respiratory  Result Value Ref Range Status   Adenovirus NOT DETECTED NOT DETECTED Final   Coronavirus 229E NOT DETECTED NOT DETECTED Final    Comment: (NOTE) The Coronavirus on the Respiratory Panel, DOES NOT test for the novel  Coronavirus (2019 nCoV)    Coronavirus HKU1 NOT DETECTED NOT DETECTED Final   Coronavirus NL63 DETECTED (A) NOT DETECTED Final   Coronavirus OC43 NOT DETECTED NOT DETECTED Final   Metapneumovirus NOT DETECTED NOT DETECTED Final   Rhinovirus / Enterovirus NOT DETECTED NOT DETECTED Final   Influenza A NOT DETECTED NOT DETECTED Final   Influenza B NOT DETECTED NOT DETECTED Final   Parainfluenza Virus 1 NOT DETECTED NOT DETECTED Final   Parainfluenza Virus 2 NOT DETECTED NOT DETECTED Final   Parainfluenza Virus 3 NOT DETECTED NOT DETECTED Final   Parainfluenza Virus 4 NOT DETECTED NOT DETECTED Final   Respiratory Syncytial Virus NOT DETECTED NOT DETECTED Final   Bordetella pertussis NOT DETECTED NOT DETECTED Final   Bordetella Parapertussis NOT DETECTED NOT DETECTED Final   Chlamydophila pneumoniae NOT DETECTED NOT DETECTED Final   Mycoplasma pneumoniae NOT DETECTED NOT DETECTED Final    Comment: Performed at [facility], [address] [place], [name] [address]     Radiology Studies: No results  found.   Scheduled Meds:  apixaban  5 mg Oral BID   carvedilol  25 mg Oral BID WC   dicyclomine  20 mg Oral TID AC   empagliflozin  25 mg Oral QAC breakfast   ferrous sulfate  325 mg Oral Daily   furosemide  20 mg Oral Daily   gabapentin  200 mg Oral TID   hydrALAZINE  25 mg Oral TID   insulin glargine-yfgn  44 Units Subcutaneous Daily   isosorbide mononitrate  60 mg Oral Daily   levETIRAcetam  250 mg Oral BID   timolol  1 drop Both Eyes BID   tobramycin  1 drop Both Eyes QID   Continuous Infusions:     LOS: 4 days    Total time spent coordinating care:   Debarah Crape, DO [facility]  To contact the attending physician between 7A-7P please use Epic Chat. To contact the covering physician during after hours 7P-7A, please review Amion.   09/27/2023, 12:54 PM   *This document has been created with the assistance of dictation software. Please excuse typographical errors. *

## 2023-09-28 DIAGNOSIS — A419 Sepsis, unspecified organism: Secondary | ICD-10-CM | POA: Diagnosis not present

## 2023-09-28 LAB — COMPREHENSIVE METABOLIC PANEL
ALT: 14 U/L (ref 0–44)
AST: 20 U/L (ref 15–41)
Albumin: 3.5 g/dL (ref 3.5–5.0)
Alkaline Phosphatase: 70 U/L (ref 38–126)
Anion gap: 7 (ref 5–15)
BUN: 20 mg/dL (ref 6–20)
CO2: 23 mmol/L (ref 22–32)
Calcium: 8.9 mg/dL (ref 8.9–10.3)
Chloride: 108 mmol/L (ref 98–111)
Creatinine, Ser: 1.31 mg/dL — ABNORMAL HIGH (ref 0.44–1.00)
GFR, Estimated: 48 mL/min — ABNORMAL LOW (ref >60)
Glucose, Bld: 104 mg/dL — ABNORMAL HIGH (ref 70–99)
Potassium: 3.7 mmol/L (ref 3.5–5.1)
Sodium: 138 mmol/L (ref 135–145)
Total Bilirubin: 0.6 mg/dL (ref 0.0–1.2)
Total Protein: 7 g/dL (ref 6.5–8.1)

## 2023-09-28 LAB — CBC WITH DIFFERENTIAL/PLATELET
Abs Immature Granulocytes: 0.01 10*3/uL (ref 0.00–0.07)
Basophils Absolute: 0.1 10*3/uL (ref 0.0–0.1)
Basophils Relative: 1 %
Eosinophils Absolute: 0.4 10*3/uL (ref 0.0–0.5)
Eosinophils Relative: 7 %
HCT: 43.2 % (ref 36.0–46.0)
Hemoglobin: 13.5 g/dL (ref 12.0–15.0)
Immature Granulocytes: 0 %
Lymphocytes Relative: 37 %
Lymphs Abs: 1.9 10*3/uL (ref 0.7–4.0)
MCH: 26.7 pg (ref 26.0–34.0)
MCHC: 31.3 g/dL (ref 30.0–36.0)
MCV: 85.4 fL (ref 80.0–100.0)
Monocytes Absolute: 0.5 10*3/uL (ref 0.1–1.0)
Monocytes Relative: 11 %
Neutro Abs: 2.3 10*3/uL (ref 1.7–7.7)
Neutrophils Relative %: 44 %
Platelets: 237 10*3/uL (ref 150–400)
RBC: 5.06 MIL/uL (ref 3.87–5.11)
RDW: 14.6 % (ref 11.5–15.5)
WBC: 5.1 10*3/uL (ref 4.0–10.5)
nRBC: 0 % (ref 0.0–0.2)

## 2023-09-28 LAB — CULTURE, BLOOD (ROUTINE X 2)
Culture: NO GROWTH
Culture: NO GROWTH
Special Requests: ADEQUATE

## 2023-09-28 LAB — PHOSPHORUS: Phosphorus: 2.7 mg/dL (ref 2.5–4.6)

## 2023-09-28 LAB — MAGNESIUM: Magnesium: 2 mg/dL (ref 1.7–2.4)

## 2023-09-28 NOTE — Discharge Summary (Addendum)
 Physician Discharge Summary   Patient: Robin [name] MRN: [id number] DOB: [birth year]  Admit date:     09/23/2023  Discharge date: 09/28/23  Discharge Physician: Debarah Crape   PCP: Jarold Motto, PA   Recommendations at discharge:    Follow up with PCP for chronic medication management  Discharge Diagnoses: Principal Problem:   Sepsis due to undetermined organism [facility]) Active Problems:   Chronic atrial fibrillation with RVR (HCC)   Type 2 diabetes mellitus with chronic kidney disease, with long-term current use of insulin (HCC)   Essential hypertension   Diabetic neuropathy (HCC)   Seizure disorder (HCC)  Resolved Problems:   * No resolved hospital problems. *  Hospital Course: Robin [name] is a 58 year old female with heart failure with reduced EF, aortic stenosis, COPD, type 2 diabetes, chronic atrial fibrillation on Eliquis, hypertension, GERD, gout, history of prior CVAs, seizure disorder, who presented to the ED with altered mental status and fever.  On arrival to the ED patient met SIRS criteria as she was febrile to 102, tachycardic to 120.  Labs revealed hyponatremia and AKI.  EKG was A-fib RVR.  Chest x-ray was with vascular congestion.  Patient was started on broad-spectrum antibiotics and admitted on medical telemetry.  Respiratory pathogens tested positive for coronavirus NL 63.  Procalcitonin was unremarkable.  Antibiotics were discontinued.  Blood cultures remained negative, CT chest abdomen pelvis without any acute abnormality, UA unremarkable.  Patient did not have leukocytosis and cortisol was within normal limits.  She received broad workup for altered mentation including head CT, B12, RPR, HIV, ammonia, all of which were within normal limits.  Gradually she resolved back to her baseline.  It is thought that her altered mental status was an acute metabolic encephalopathy secondary to her upper respiratory infection.  She worked with physical therapy and  Occupational Therapy.  We had discussions with her caregiver, sister, and discussed possibility of rehab or SNF at discharge.  She requested the patient return home directly as they have caregivers prearranged.  She will go home with home health assistance.  On 2/23 patient was at her physiologic baseline and discharged home.   Expand All Collapse All   PROGRESS NOTE       Robin [name]          CZY:[id number] DOB: Jul 24, 1966 DOA: 09/23/2023 PCP: Jarold Motto, PA      Chief Complaint  Patient presents with   Altered Mental Status      Hospital Course:  Robin [name] is 58 y.o. female with heart failure with reduced EF, aortic stenosis, COPD, type 2 diabetes, chronic atrial fibrillation on Eliquis, hypertension, GERD, gout, history of CVA, who presented to the ED with altered mental status and fever.  Patient also endorsed recent confusion and diminished appetite, without GI upset.  In the ED patient met SIRS criteria, febrile to 102, tachycardic to 120.  Labs revealed mild hyponatremia and creatinine 1.74.  Urinalysis is glucose but otherwise unremarkable.  EKG revealed A-fib with RVR.  Chest x-ray with vascular congestion.  Patient was given Tylenol, cefepime, Flagyl, vancomycin and 1 L NS bolus.  She was admitted for medical telemetry. Note patient was recently admitted for similar presentation and during that admission was found to have new focal seizures.  She was evaluated by neurology and started on Keppra 250 twice daily.  She was discharged directly to skilled nursing facility.  Pt was discharged from SNF on 2/13 and returned home independently with the assistance of caregivers.  Subjective: No acute events overnight.  Pt is alert and talkative this AM. She does not want to get out of bed.     Objective:       Vitals:    09/26/23 2003 09/27/23 0428 09/27/23 0836 09/27/23 0900  BP: 101/68 (!) 144/83 135/89 107/76  Pulse: 71 72 82 90  Resp: 20 20      Temp: 97.6 F (36.4  C) 97.6 F (36.4 C) 98.1 F (36.7 C) 98.2 F (36.8 C)  TempSrc: Oral Oral Oral Oral  SpO2: 98% 100% 100% 100%  Weight:          Height:              Intake/Output Summary (Last 24 hours) at 09/27/2023 1254 Last data filed at 09/27/2023 1610    Gross per 24 hour  Intake 840 ml  Output 1950 ml  Net -1110 ml       Filed Weights    09/24/23 0100  Weight: 108.9 kg      Examination: General exam: Appears calm and comfortable, NAD Respiratory system: No work of breathing, symmetric chest wall expansion Cardiovascular system: S1 & S2 heard, RRR.  Gastrointestinal system: Abdomen is nondistended, soft and nontender.  Neuro: Alert, oriented to self and place, follows all commands Extremities: Symmetric, expected ROM Skin: No rashes, lesions Psychiatry: Calm, mood and affect congruent   Assessment & Plan:  Principal Problem:   Sepsis due to undetermined organism [facility]) Active Problems:   Chronic atrial fibrillation with RVR (HCC)   Type 2 diabetes mellitus with chronic kidney disease, with long-term current use of insulin (HCC)   Essential hypertension   Diabetic neuropathy (HCC)   Seizure disorder (HCC)       Sepsis secondary to viral infection - PCR positive for coronavirus NL63 - No leukocytosis, cortisol within normal limits. -- Influenza/RSV/COVID negative - UA unremarkable - Chest x-ray with vascular congestion - Blood cultures taken, Negative to date. - CT chest abdomen pelvis: Without any acute abnormality -- Suspect this is all secondary to viral infection, procalcitonin is unremarkable.  Have discontinued further antibiotics  Acute Metabolic Encephalopathy, resolved History of CVA - This is likely multifactorial, suspect most is secondary to generalized deconditioning combined with acute viral infection.   -  thiamine levels pending, B12, RPR, HIV, ammonia WNL -- CK WNL - Patient had recent brain MRI on prior admission 2 weeks ago which revealed chronic left  MCA infarct as well as a hypoplastic distal left vertebral artery with absent flow suggesting possible occlusion.  --  Family reports history of aneurysms with attempted clipping in the past. -- Repeat Head CT this admission is unremarkable.  - PT/OT evals ordered - will need HH at discharge - Baseline: Patient lives independently, has caregivers from 9-1 daily, is able to ambulate and prepare meals, does have some dysarthria at baseline.   Chronic atrial fibrillation with RVR, resolved - Continue Coreg - IV Cardizem as needed - Continue home dose Eliquis - Suspect RVR is triggered by acute infection   Type 2 diabetes with chronic kidney disease, with long-term use of insulin - Associated stage IIIb CKD, stable - Continue basal/bolus/sliding scale insulin - Continue Jardiance   Seizure disorder - Continue Keppra   Diabetic neuropathy - Continue Neurontin   Hypertension - Continue home dose antihypertensives   Mild prominence of ascending aorta - 4 cm on CT - Annual follow-up recommended by CTA or MRA   BMI:  37.59 kg/m  Obesity class  II - Outpatient follow up for lifestyle modification and risk factor management         Consultants: n/a Procedures performed: n/a  Disposition: Home health Diet recommendation:  Discharge Diet Orders (From admission, onward)     Start     Ordered   09/28/23 0000  Diet general        09/28/23 1118           Cardiac and Carb modified diet DISCHARGE MEDICATION: Allergies as of 09/28/2023       Reactions   Bee Venom Shortness Of Breath, Swelling   Bodily Swelling   Losartan Other (See Comments)   Nosebleeds   Naproxen Other (See Comments)   Acid reflux   Lisinopril Other (See Comments)   Sinus congestion   Penicillins Nausea Only        Medication List     TAKE these medications    Accu-Chek Guide Me w/Device Kit 1 Device by Does not apply route 3 (three) times daily.   Accu-Chek Guide test strip Generic drug:  glucose blood USE 1 STRIP TO CHECK GLUCOSE THREE TIMES DAILY   Accu-Chek Softclix Lancets lancets Use as instructed   aspirin EC 81 MG tablet Take 1 tablet (81 mg total) by mouth daily. Swallow whole.   BD Pen Needle Nano U/F 32G X 4 MM Misc Generic drug: Insulin Pen Needle Inject 1 Device into the skin in the morning, at noon, in the evening, and at bedtime.   carvedilol 25 MG tablet Commonly known as: COREG TAKE 2 TABLETS BY MOUTH TWICE DAILY WITH A MEAL   dicyclomine 20 MG tablet Commonly known as: BENTYL Take 20 mg by mouth 3 (three) times daily before meals.   Eliquis 5 MG Tabs tablet Generic drug: apixaban Take 1 tablet by mouth twice daily   empagliflozin 25 MG Tabs tablet Commonly known as: Jardiance Take 1 tablet (25 mg total) by mouth daily before breakfast.   FeroSul 325 (65 Fe) MG tablet Generic drug: ferrous sulfate Take 1 tablet by mouth once daily   furosemide 20 MG tablet Commonly known as: LASIX TAKE 1 TABLET BY MOUTH ONCE DAILY (MAY TAKE ADDITIONAL 20 MG IF WEIGHT GAIN IS OVER 2LBS OVERNIGHT OR 5 LBS IN A WEEK)   gabapentin 100 MG capsule Commonly known as: NEURONTIN TAKE 2 CAPSULES BY MOUTH THREE TIMES DAILY   hydrALAZINE 25 MG tablet Commonly known as: APRESOLINE TAKE 1 TABLET BY MOUTH THREE TIMES DAILY   isosorbide mononitrate 30 MG 24 hr tablet Commonly known as: IMDUR Take 2 tablets by mouth once daily   Lantus SoloStar 100 UNIT/ML Solostar Pen Generic drug: insulin glargine Inject 44 Units into the skin daily.   levETIRAcetam 250 MG tablet Commonly known as: KEPPRA Take 1 tablet (250 mg total) by mouth 2 (two) times daily.   NovoLOG FlexPen 100 UNIT/ML FlexPen Generic drug: insulin aspart Inject 12 Units into the skin 3 (three) times daily with meals.   timolol 0.5 % ophthalmic solution Commonly known as: TIMOPTIC Place 1 drop into both eyes 2 (two) times daily.   tobramycin 0.3 % ophthalmic solution Commonly known as:  TOBREX Place 1 drop into both eyes 4 (four) times daily.   Trulicity 3 MG/0.5ML Soaj Generic drug: Dulaglutide Inject 3 mg as directed once a week.        Discharge Exam: Filed Weights   09/24/23 0100  Weight: 108.9 kg   Constitutional:  Normal appearance. Non toxic-appearing.  HENT: Head Normocephalic and  atraumatic.  Mucous membranes are moist.  Eyes:  Extraocular intact. Conjunctivae normal. Pupils are equal, round, and reactive to light.  Cardiovascular: Rate and Rhythm: Normal rate and regular rhythm.  Pulmonary: Non labored, symmetric rise of chest wall.  Musculoskeletal:  Normal range of motion.  Skin: warm and dry. not jaundiced.  Neurological: Alert. Oriented x3, some word finding difficulty. Ambulating well  Psychiatric: Mood and Affect congruent.    Condition at discharge: stable  The results of significant diagnostics from this hospitalization (including imaging, microbiology, ancillary and laboratory) are listed below for reference.   Imaging Studies: CT HEAD WO CONTRAST ( ) Result Date: 09/24/2023 CLINICAL DATA:  Mental status change, persistent or worsening. Fever. EXAM: CT HEAD WITHOUT CONTRAST TECHNIQUE: Contiguous axial images were obtained from the base of the skull through the vertex without intravenous contrast. RADIATION DOSE REDUCTION: This exam was performed according to the departmental dose-optimization program which includes automated exposure control, adjustment of the mA and/or kV according to patient size and/or use of iterative reconstruction technique. COMPARISON:  None Available. FINDINGS: Brain: No acute hemorrhage. Unchanged old, chronic left MCA territory infarct. No new loss of gray-white differentiation. No hydrocephalus or extra-axial collection. No mass effect or midline shift. Vascular: No hyperdense vessel or unexpected calcification. Skull: No calvarial fracture or suspicious bone lesion. Skull base is unremarkable. Sinuses/Orbits: No  acute finding. Other: None. IMPRESSION: 1. No acute intracranial abnormality. 2. Unchanged old, chronic left MCA territory infarct. Electronically Signed   By: Orvan Falconer M.D.   On: 09/24/2023 15:16   CT CHEST ABDOMEN PELVIS WO CONTRAST Result Date: 09/23/2023 CLINICAL DATA:  Fevers with possible sepsis, initial encounter EXAM: CT CHEST, ABDOMEN AND PELVIS WITHOUT CONTRAST TECHNIQUE: Multidetector CT imaging of the chest, abdomen and pelvis was performed following the standard protocol without IV contrast. RADIATION DOSE REDUCTION: This exam was performed according to the departmental dose-optimization program which includes automated exposure control, adjustment of the mA and/or kV according to patient size and/or use of iterative reconstruction technique. COMPARISON:  Chest x-ray from earlier in the same day. CT from 08/18/2023 FINDINGS: CT CHEST FINDINGS Cardiovascular: Somewhat limited due to lack of IV contrast. Atherosclerotic calcifications are noted. Ascending aorta is mildly prominent at 4 cm. Normal tapering is noted in the thoracic aortic arch. Heart is mildly enlarged in size. Mediastinum/Nodes: Thoracic inlet is within normal limits. No hilar or mediastinal adenopathy is noted. The esophagus as visualized is within normal limits. Lungs/Pleura: Lungs are well aerated bilaterally. No focal infiltrate or sizable effusion is seen. No parenchymal nodules are noted. Musculoskeletal: No chest wall mass or suspicious bone lesions identified. CT ABDOMEN PELVIS FINDINGS Hepatobiliary: No focal liver abnormality is seen. Status post cholecystectomy. No biliary dilatation. Pancreas: Unremarkable. No pancreatic ductal dilatation or surrounding inflammatory changes. Spleen: Normal in size without focal abnormality. Adrenals/Urinary Tract: Adrenal glands are within normal limits. Kidneys are well visualized bilaterally. No renal calculi or obstructive changes are seen. Bladder is decompressed. Stomach/Bowel:  No obstructive or inflammatory changes of the colon are seen. Fluid is noted throughout which may be related to a diarrheal state. The appendix is not well visualized and may have been surgically removed. No inflammatory changes are noted. Small bowel and stomach are within normal limits. Vascular/Lymphatic: Aortic atherosclerosis. No enlarged abdominal or pelvic lymph nodes. Reproductive: Status post hysterectomy. No adnexal masses. Other: No abdominal wall hernia or abnormality. No abdominopelvic ascites. Musculoskeletal: No acute or significant osseous findings. IMPRESSION: CT of the chest: Mild prominence of the ascending  aorta to 4 cm. Recommend annual imaging followup by CTA or MRA. This recommendation follows 2010 ACCF/AHA/AATS/ACR/ASA/SCA/SCAI/SIR/STS/SVM Guidelines for the Diagnosis and Management of Patients with Thoracic Aortic Disease. Circulation. 2010; 121: Z610-R604. Aortic aneurysm NOS (ICD10-I71.9) CT of the abdomen and pelvis: No acute abnormality noted. Electronically Signed   By: Alcide Clever M.D.   On: 09/23/2023 23:37   DG Chest Port 1 View Result Date: 09/23/2023 CLINICAL DATA:  Sepsis EXAM: PORTABLE CHEST 1 VIEW COMPARISON:  None Available. FINDINGS: Lungs are clear. No pneumothorax or pleural effusion. Stable mild-to-moderate cardiomegaly. Mild central pulmonary vascular congestion without overt pulmonary edema. No acute bone abnormality. IMPRESSION: 1. Stable cardiomegaly with central pulmonary vascular congestion Electronically Signed   By: Helyn Numbers M.D.   On: 09/23/2023 21:57   EEG adult Result Date: 09/08/2023 Charlsie Quest, MD     09/08/2023  1:12 PM Patient Name: Lashun Ramseyer Bolander MRN: [id number] Epilepsy Attending: Charlsie Quest Referring Physician/Provider: Onnie Boer, MD Date: 09/08/2023 Duration: 23.36 mins Patient history: 58yo F with reported tremors to her right side. EEG to evaluate for seizure Level of alertness: Awake, asleep AEDs during EEG study: GBP,  LEV Technical aspects: This EEG study was done with scalp electrodes positioned according to the 10-20 International system of electrode placement. Electrical activity was reviewed with band pass filter of 1-70Hz , sensitivity of 7 uV/mm, display speed of 48mm/sec with a 60Hz  notched filter applied as appropriate. EEG data were recorded continuously and digitally stored.  Video monitoring was available and reviewed as appropriate. Description: The posterior dominant rhythm consists of 7.5 Hz activity of moderate voltage (25-35 uV) seen predominantly in posterior head regions, symmetric and reactive to eye opening and eye closing. Sleep was characterized by vertex waves, sleep spindles (12 to 14 Hz), maximal frontocentral region.  EEG showed continuous 3 to 5 Hz theta- delta slowing in left hemisphere, maximal left frontotemporal region.  Additionally there is intermittent 3-5 hz theta- delta slowing in right hemisphere.  Hyperventilation and photic stimulation were not performed.   ABNORMALITY -Continuous slow, left hemisphere, maximal left frontotemporal region -Intermittent slow, right hemisphere IMPRESSION: This study is suggestive of cortical dysfunction in left hemisphere, maximal left frontotemporal region consistent with underlying encephalomalacia. Additionally there is thick cortical dysfunction in right hemisphere. No seizures or definite epileptiform discharges were seen throughout the recording. Please note lack of epileptiform activity during interictal EEG does not exclude the diagnosis of epilepsy. Charlsie Quest   MR BRAIN WO CONTRAST Result Date: 09/07/2023 CLINICAL DATA:  Mental status change, unknown cause. Acute onset of reduced responsiveness with right-sided twitching. History of left MCA stroke. EXAM: MRI HEAD WITHOUT CONTRAST TECHNIQUE: Multiplanar, multiecho pulse sequences of the brain and surrounding structures were obtained without intravenous contrast. COMPARISON:  Head CT 09/07/2023  and MRI 11/13/2020 FINDINGS: Brain: There is no evidence of an acute infarct, mass, midline shift, or extra-axial fluid collection. A large chronic infarct is again noted anteriorly in the left MCA territory with associated cortical laminar necrosis, chronic blood products, and ex vacuo dilatation of the left lateral ventricle. Wallerian degeneration involves the left cerebral peduncle. No significant chronic small vessel white matter disease is evident. Vascular: Hypoplastic left vertebral artery with absent flow void, either new or more prominent than on the prior MRI. Skull and upper cervical spine: Unremarkable bone marrow signal. Sinuses/Orbits: Unremarkable orbits. Paranasal sinuses and mastoid air cells are clear. Other: None. IMPRESSION: 1. No acute intracranial abnormality. 2. Large chronic left MCA infarct. 3.  Hypoplastic distal left vertebral artery with absent flow void suggesting slow flow or occlusion. Electronically Signed   By: Sebastian Ache M.D.   On: 09/07/2023 16:46   DG Chest Portable 1 View Result Date: 09/07/2023 CLINICAL DATA:  Cough EXAM: PORTABLE CHEST 1 VIEW COMPARISON:  08/17/2023 FINDINGS: Cardiomegaly, vascular congestion. No confluent opacities, effusions or edema. No acute bony abnormality. IMPRESSION: Cardiomegaly, vascular congestion. Electronically Signed   By: Charlett Nose M.D.   On: 09/07/2023 15:41   CT HEAD CODE STROKE WO CONTRAST Result Date: 09/07/2023 CLINICAL DATA:  Code stroke. Aphasia and right-sided weakness. Seizure activity. History of stroke. EXAM: CT HEAD WITHOUT CONTRAST TECHNIQUE: Contiguous axial images were obtained from the base of the skull through the vertex without intravenous contrast. RADIATION DOSE REDUCTION: This exam was performed according to the departmental dose-optimization program which includes automated exposure control, adjustment of the mA and/or kV according to patient size and/or use of iterative reconstruction technique. COMPARISON:  Head  CT 11/26/2021 FINDINGS: Brain: There is no evidence of an acute infarct, intracranial hemorrhage, mass, midline shift, or extra-axial fluid collection. There is an unchanged large chronic infarct anteriorly in the left MCA territory with associated ex vacuo dilatation of the left lateral and third ventricles. The right lateral and fourth ventricles are normal in size. Vascular: No suspicious acute vascular hyperdensity. Skull: No fracture or suspicious osseous lesion. Sinuses/Orbits: Paranasal sinuses and mastoid air cells are clear. Unremarkable orbits. Other: None. ASPECTS ([place] Stroke Program Early CT Score) - Ganglionic level infarction (caudate, lentiform nuclei, internal capsule, insula, M1-M3 cortex): 7 - Supraganglionic infarction (M4-M6 cortex): 3 Total score (0-10 with 10 being normal): 10 These results were called by telephone at the time of interpretation on 09/07/2023 at 1:33 pm to Dr. Beckey Downing, who verbally acknowledged these results. IMPRESSION: 1. No evidence of acute intracranial abnormality. 2. Unchanged large chronic left MCA infarct. Electronically Signed   By: Sebastian Ache M.D.   On: 09/07/2023 13:33    Microbiology: Results for orders placed or performed during the hospital encounter of 09/23/23  Resp panel by RT-PCR (RSV, Flu A&B, Covid) Anterior Nasal Swab     Status: None   Collection Time: 09/23/23  8:50 PM   Specimen: Anterior Nasal Swab  Result Value Ref Range Status   SARS Coronavirus 2 by RT PCR NEGATIVE NEGATIVE Final    Comment: (NOTE) SARS-CoV-2 target nucleic acids are NOT DETECTED.  The SARS-CoV-2 RNA is generally detectable in upper respiratory specimens during the acute phase of infection. The lowest concentration of SARS-CoV-2 viral copies this assay can detect is 138 copies/mL. A negative result does not preclude SARS-Cov-2 infection and should not be used as the sole basis for treatment or other patient management decisions. A negative result may occur  with  improper specimen collection/handling, submission of specimen other than nasopharyngeal swab, presence of viral mutation(s) within the areas targeted by this assay, and inadequate number of viral copies(<138 copies/mL). A negative result must be combined with clinical observations, patient history, and epidemiological information. The expected result is Negative.  Fact Sheet for Patients:  BloggerCourse.com  Fact Sheet for Healthcare Providers:  SeriousBroker.it  This test is no t yet approved or cleared by the [place] FDA and  has been authorized for detection and/or diagnosis of SARS-CoV-2 by FDA under an Emergency Use Authorization (EUA). This EUA will remain  in effect (meaning this test can be used) for the duration of the COVID-19 declaration under Section 564(b)(1) of the Act,  21 U.S.C.section 360bbb-3(b)(1), unless the authorization is terminated  or revoked sooner.       Influenza A by PCR NEGATIVE NEGATIVE Final   Influenza B by PCR NEGATIVE NEGATIVE Final    Comment: (NOTE) The Xpert Xpress SARS-CoV-2/FLU/RSV plus assay is intended as an aid in the diagnosis of influenza from Nasopharyngeal swab specimens and should not be used as a sole basis for treatment. Nasal washings and aspirates are unacceptable for Xpert Xpress SARS-CoV-2/FLU/RSV testing.  Fact Sheet for Patients: BloggerCourse.com  Fact Sheet for Healthcare Providers: SeriousBroker.it  This test is not yet approved or cleared by the [place] FDA and has been authorized for detection and/or diagnosis of SARS-CoV-2 by FDA under an Emergency Use Authorization (EUA). This EUA will remain in effect (meaning this test can be used) for the duration of the COVID-19 declaration under Section 564(b)(1) of the Act, 21 U.S.C. section 360bbb-3(b)(1), unless the authorization is terminated  or revoked.     Resp Syncytial Virus by PCR NEGATIVE NEGATIVE Final    Comment: (NOTE) Fact Sheet for Patients: BloggerCourse.com  Fact Sheet for Healthcare Providers: SeriousBroker.it  This test is not yet approved or cleared by the [place] FDA and has been authorized for detection and/or diagnosis of SARS-CoV-2 by FDA under an Emergency Use Authorization (EUA). This EUA will remain in effect (meaning this test can be used) for the duration of the COVID-19 declaration under Section 564(b)(1) of the Act, 21 U.S.C. section 360bbb-3(b)(1), unless the authorization is terminated or revoked.  Performed at [facility], [address] [place], [name] [address]   Blood Culture (routine x 2)     Status: None   Collection Time: 09/23/23  9:05 PM   Specimen: BLOOD RIGHT FOREARM  Result Value Ref Range Status   Specimen Description   Final    BLOOD RIGHT FOREARM BOTTLES DRAWN AEROBIC AND ANAEROBIC   Special Requests   Final    Blood Culture results may not be optimal due to an inadequate volume of blood received in culture bottles   Culture   Final    NO GROWTH 5 DAYS Performed at [facility], [address] [place], [name] [address]    Report Status 09/28/2023 FINAL  Final  Blood Culture (routine x 2)     Status: None   Collection Time: 09/23/23  9:05 PM   Specimen: Left Antecubital; Blood  Result Value Ref Range Status   Specimen Description   Final    LEFT ANTECUBITAL BOTTLES DRAWN AEROBIC AND ANAEROBIC   Special Requests Blood Culture adequate volume  Final   Culture   Final    NO GROWTH 5 DAYS Performed at [facility], [address], [place], [name] [address]    Report Status 09/28/2023 FINAL  Final  Respiratory (~20 pathogens) panel by PCR     Status: Abnormal   Collection Time: 09/24/23  6:00 AM   Specimen: Nasopharyngeal Swab; Respiratory  Result Value Ref Range Status   Adenovirus NOT DETECTED NOT DETECTED  Final   Coronavirus 229E NOT DETECTED NOT DETECTED Final    Comment: (NOTE) The Coronavirus on the Respiratory Panel, DOES NOT test for the novel  Coronavirus (2019 nCoV)    Coronavirus HKU1 NOT DETECTED NOT DETECTED Final   Coronavirus NL63 DETECTED (A) NOT DETECTED Final   Coronavirus OC43 NOT DETECTED NOT DETECTED Final   Metapneumovirus NOT DETECTED NOT DETECTED Final   Rhinovirus / Enterovirus NOT DETECTED NOT DETECTED Final   Influenza A NOT DETECTED  NOT DETECTED Final   Influenza B NOT DETECTED NOT DETECTED Final   Parainfluenza Virus 1 NOT DETECTED NOT DETECTED Final   Parainfluenza Virus 2 NOT DETECTED NOT DETECTED Final   Parainfluenza Virus 3 NOT DETECTED NOT DETECTED Final   Parainfluenza Virus 4 NOT DETECTED NOT DETECTED Final   Respiratory Syncytial Virus NOT DETECTED NOT DETECTED Final   Bordetella pertussis NOT DETECTED NOT DETECTED Final   Bordetella Parapertussis NOT DETECTED NOT DETECTED Final   Chlamydophila pneumoniae NOT DETECTED NOT DETECTED Final   Mycoplasma pneumoniae NOT DETECTED NOT DETECTED Final    Comment: Performed at [facility], [address] [place], [name] [address]    Labs: CBC: Recent Labs  Lab 09/23/23 2105 09/24/23 0248 09/25/23 0427 09/26/23 0425 09/27/23 0310 09/28/23 0652  WBC 6.0 6.5 5.3 6.2 5.2 5.1  NEUTROABS 4.2  --  2.7 2.8 1.9 2.3  HGB 14.6 14.6 14.0 13.3 13.2 13.5  HCT 46.5* 46.7* 44.1 41.6 42.5 43.2  MCV 85.3 86.2 85.6 86.1 85.3 85.4  PLT 257 238 207 201 207 237   Basic Metabolic Panel: Recent Labs  Lab 09/24/23 0248 09/25/23 0427 09/26/23 0425 09/27/23 0310 09/28/23 0652  NA 137 135 137 138 138  K 4.0 3.6 3.6 3.5 3.7  CL 107 106 106 109 108  CO2 18* 20* 23 21* 23  GLUCOSE 129* 90 91 87 104*  BUN 22* 18 20 20 20   CREATININE 1.64* 1.40* 1.48* 1.41* 1.31*  CALCIUM 8.6* 8.4* 8.8* 8.8* 8.9  MG  --  1.8 1.9 2.2 2.0  PHOS  --  2.0* 2.1* 2.6 2.7   Liver Function Tests: Recent Labs  Lab 09/23/23 2105  09/25/23 0427 09/26/23 0425 09/27/23 0310 09/28/23 0652  AST 18 18 16 15 20   ALT 16 14 11 12 14   ALKPHOS 83 67 68 67 70  BILITOT 1.1 1.2 0.6 0.7 0.6  PROT 7.6 6.9 6.8 6.7 7.0  ALBUMIN 3.8 3.4* 3.3* 3.3* 3.5   CBG: Recent Labs  Lab 09/24/23 0929 09/25/23 1149 09/25/23 1627 09/25/23 2042  GLUCAP 93 107* 106* 126*    Discharge time spent: greater than 30 minutes.  Signed: Debarah Crape, DO [facility] 09/28/2023

## 2023-09-28 NOTE — Hospital Course (Signed)
 Robin [name] is a 58 year old female with heart failure with reduced EF, aortic stenosis, COPD, type 2 diabetes, chronic atrial fibrillation on Eliquis, hypertension, GERD, gout, history of prior CVAs, seizure disorder, who presented to the ED with altered mental status and fever.  On arrival to the ED patient met SIRS criteria as she was febrile to 102, tachycardic to 120.  Labs revealed hyponatremia and AKI.  EKG was A-fib RVR.  Chest x-ray was with vascular congestion.  Patient was started on broad-spectrum antibiotics and admitted on medical telemetry.  Respiratory pathogens tested positive for coronavirus NL 63.  Procalcitonin was unremarkable.  Antibiotics were discontinued.  Blood cultures remained negative, CT chest abdomen pelvis without any acute abnormality, UA unremarkable.  Patient did not have leukocytosis and cortisol was within normal limits.  She received broad workup for altered mentation including head CT, B12, RPR, HIV, ammonia, all of which were within normal limits.  Gradually she resolved back to her baseline.  It is thought that her altered mental status was an acute metabolic encephalopathy secondary to her upper respiratory infection.  She worked with physical therapy and Occupational Therapy.  We had discussions with her caregiver, sister, and discussed possibility of rehab or SNF at discharge.  She requested the patient return home directly as they have caregivers prearranged.  She will go home with home health assistance.  On 2/23 patient was at her physiologic baseline and discharged home.

## 2023-09-28 NOTE — Plan of Care (Signed)

## 2023-09-28 NOTE — Progress Notes (Signed)
 Nsg Discharge Note  Admit Date:  09/23/2023 Discharge date: 09/28/2023   Harriett Sine A Lybarger to be D/C'd Home per MD order.  AVS completed.  Copy for chart, and copy for patient signed, and dated. Patient/caregiver able to verbalize understanding.  Discharge Medication: Allergies as of 09/28/2023       Reactions   Bee Venom Shortness Of Breath, Swelling   Bodily Swelling   Losartan Other (See Comments)   Nosebleeds   Naproxen Other (See Comments)   Acid reflux   Lisinopril Other (See Comments)   Sinus congestion   Penicillins Nausea Only        Medication List     TAKE these medications    Accu-Chek Guide Me w/Device Kit 1 Device by Does not apply route 3 (three) times daily.   Accu-Chek Guide test strip Generic drug: glucose blood USE 1 STRIP TO CHECK GLUCOSE THREE TIMES DAILY   Accu-Chek Softclix Lancets lancets Use as instructed   aspirin EC 81 MG tablet Take 1 tablet (81 mg total) by mouth daily. Swallow whole.   BD Pen Needle Nano U/F 32G X 4 MM Misc Generic drug: Insulin Pen Needle Inject 1 Device into the skin in the morning, at noon, in the evening, and at bedtime.   carvedilol 25 MG tablet Commonly known as: COREG TAKE 2 TABLETS BY MOUTH TWICE DAILY WITH A MEAL   dicyclomine 20 MG tablet Commonly known as: BENTYL Take 20 mg by mouth 3 (three) times daily before meals.   Eliquis 5 MG Tabs tablet Generic drug: apixaban Take 1 tablet by mouth twice daily   empagliflozin 25 MG Tabs tablet Commonly known as: Jardiance Take 1 tablet (25 mg total) by mouth daily before breakfast.   FeroSul 325 (65 Fe) MG tablet Generic drug: ferrous sulfate Take 1 tablet by mouth once daily   furosemide 20 MG tablet Commonly known as: LASIX TAKE 1 TABLET BY MOUTH ONCE DAILY (MAY TAKE ADDITIONAL 20 MG IF WEIGHT GAIN IS OVER 2LBS OVERNIGHT OR 5 LBS IN A WEEK)   gabapentin 100 MG capsule Commonly known as: NEURONTIN TAKE 2 CAPSULES BY MOUTH THREE TIMES DAILY    hydrALAZINE 25 MG tablet Commonly known as: APRESOLINE TAKE 1 TABLET BY MOUTH THREE TIMES DAILY   isosorbide mononitrate 30 MG 24 hr tablet Commonly known as: IMDUR Take 2 tablets by mouth once daily   Lantus SoloStar 100 UNIT/ML Solostar Pen Generic drug: insulin glargine Inject 44 Units into the skin daily.   levETIRAcetam 250 MG tablet Commonly known as: KEPPRA Take 1 tablet (250 mg total) by mouth 2 (two) times daily.   NovoLOG FlexPen 100 UNIT/ML FlexPen Generic drug: insulin aspart Inject 12 Units into the skin 3 (three) times daily with meals.   timolol 0.5 % ophthalmic solution Commonly known as: TIMOPTIC Place 1 drop into both eyes 2 (two) times daily.   tobramycin 0.3 % ophthalmic solution Commonly known as: TOBREX Place 1 drop into both eyes 4 (four) times daily.   Trulicity 3 MG/0.5ML Soaj Generic drug: Dulaglutide Inject 3 mg as directed once a week.        Discharge Assessment: Vitals:   09/27/23 2107 09/28/23 0414  BP: 111/76 (!) 124/94  Pulse:  83  Resp:  18  Temp:  97.8 F (36.6 C)  SpO2:  99%   Skin clean, dry and intact without evidence of skin break down, no evidence of skin tears noted. IV catheter discontinued intact. Site without signs and symptoms of  complications - no redness or edema noted at insertion site, patient denies c/o pain - only slight tenderness at site.  Dressing with slight pressure applied.  D/c Instructions-Education: Discharge instructions given to patient/family with verbalized understanding. D/c education completed with patient/family including follow up instructions, medication list, d/c activities limitations if indicated, with other d/c instructions as indicated by MD - patient able to verbalize understanding, all questions fully answered. Patient instructed to return to ED, call 911, or call MD for any changes in condition.  Patient escorted via WC, and D/C home via private auto.  Demetrio Lapping,  LPN [id number] 47:82 AM

## 2023-09-28 NOTE — Progress Notes (Signed)
 SATURATION QUALIFICATIONS: (This note is used to comply with regulatory documentation for home oxygen)  Patient Saturations on Room Air at Rest = 98%  Patient Saturations on Room Air while Ambulating = 98%

## 2023-09-29 ENCOUNTER — Telehealth: Payer: Self-pay | Admitting: *Deleted

## 2023-09-29 ENCOUNTER — Telehealth: Payer: Self-pay | Admitting: Physician Assistant

## 2023-09-29 NOTE — Telephone Encounter (Signed)
 Received faxed document Home Health Certificate (Order ID [id number] ), to be filled out by provider. Patient requested to send it back via Fax within 5-days. Document is located in providers tray at front office.Please advise

## 2023-09-29 NOTE — Transitions of Care (Post Inpatient/ED Visit) (Signed)
   09/29/2023  Name: Peggie Hornak Allcock MRN: [id number] DOB: 13-Oct-1965  Today's TOC FU Call Status: Today's TOC FU Call Status:: Unsuccessful Call (1st Attempt) Unsuccessful Call (1st Attempt) Date: 09/29/23  Attempted to reach the patient regarding the most recent Inpatient/ED visit.  Follow Up Plan: Additional outreach attempts will be made to reach the patient to complete the Transitions of Care (Post Inpatient/ED visit) call.   Irving Shows [facility], BSN RN Care Manager/ Transition of Care [place]/ [facility] [phone]

## 2023-09-30 ENCOUNTER — Telehealth: Payer: Self-pay | Admitting: *Deleted

## 2023-09-30 ENCOUNTER — Encounter: Payer: Self-pay | Admitting: *Deleted

## 2023-09-30 LAB — VITAMIN B1: Vitamin B1 (Thiamine): 80.6 nmol/L (ref 66.5–200.0)

## 2023-09-30 NOTE — Transitions of Care (Post Inpatient/ED Visit) (Signed)
 09/30/2023  Name: Robin [name] MRN: [id number] DOB: 10-06-1965  Today's TOC FU Call Status: Today's TOC FU Call Status:: Successful TOC FU Call Completed TOC FU Call Complete Date: 09/30/23 Patient's Name and Date of Birth confirmed.  Transition Care Management Follow-up Telephone Call Date of Discharge: 09/28/23 [facility] Type of Discharge: Inpatient Admission Primary Inpatient Discharge Diagnosis:: Sepsis due to undetermined organism How have you been since you were released from the hospital?:  (pt is eating and drinking well, ambulating with cane, has aide in the home M-F 9-1, 4-8 pm, Sat, Sun 9-1, pt weighs daily and logs, checks CBG TID and logs) Any questions or concerns?: No  Items Reviewed: Did you receive and understand the discharge instructions provided?: Yes Any new allergies since your discharge?: No Dietary orders reviewed?: Yes Type of Diet Ordered:: heart healthy, carbohydrate modified Do you have support at home?: Yes People in Home: alone Name of Support/Comfort Primary Source: has aide in the home, sister Louretta Parma provides oversight Patient's sister DPR declined enrollment in TOC 30 day program Reviewed Heart Failure action plan, importance of calling doctor early on for change in health status, symptoms Patient weighs daily, keeps a log, pt checks CBG TID  Medications Reviewed Today: Medications Reviewed Today     Reviewed by Audrie Gallus, RN (Registered Nurse) on 09/30/23 at 1105  Med List Status: <None>   Medication Order Taking? Sig Documenting Provider Last Dose Status Informant  Accu-Chek Softclix Lancets lancets [id number] Yes Use as instructed Shamleffer, Konrad Dolores, MD Taking Active Pharmacy Records, Family Member  apixaban (ELIQUIS) 5 MG TABS tablet [id number] Yes Take 1 tablet by mouth twice daily Jarold Motto, [place] Taking Active Family Member           Med Note Elesa Massed, Illene Labrador   Tue Sep 23, 2023  9:15 PM)     aspirin EC 81 MG tablet [id number] Yes Take 1 tablet (81 mg total) by mouth daily. Swallow whole. Ellsworth Lennox, PA-C Taking Active Family Member           Med Note (WARD, Illene Labrador   Tue Sep 23, 2023  9:15 PM)    Blood Glucose Monitoring Suppl (ACCU-CHEK GUIDE ME) w/Device KIT [id number] Yes 1 Device by Does not apply route 3 (three) times daily. Shamleffer, Konrad Dolores, MD Taking Active Pharmacy Records, Family Member  carvedilol (COREG) 25 MG tablet [id number] Yes TAKE 2 TABLETS BY MOUTH TWICE DAILY WITH A MEAL Branch, Dorothe Pea, MD Taking Active Family Member  dicyclomine (BENTYL) 20 MG tablet [id number] Yes Take 20 mg by mouth 3 (three) times daily before meals. [provider] Taking Active Family Member  Dulaglutide (TRULICITY) 3 MG/0.5ML SOPN [id number] Yes Inject 3 mg as directed once a week. Shamleffer, Konrad Dolores, MD Taking Active Family Member  empagliflozin (JARDIANCE) 25 MG TABS tablet [id number] Yes Take 1 tablet (25 mg total) by mouth daily before breakfast. Shamleffer, Konrad Dolores, MD Taking Active Family Member  ferrous sulfate (FEROSUL) 325 (65 FE) MG tablet [id number] Yes Take 1 tablet by mouth once daily Branch, [place], [place] Taking Active Family Member  furosemide (LASIX) 20 MG tablet [id number] Yes TAKE 1 TABLET BY MOUTH ONCE DAILY (MAY TAKE ADDITIONAL 20 MG IF WEIGHT GAIN IS OVER 2LBS OVERNIGHT OR 5 LBS IN A WEEK) Antoine Poche, MD Taking Active Family Member  gabapentin (NEURONTIN) 100 MG capsule [id number] Yes TAKE 2 CAPSULES BY MOUTH THREE TIMES DAILY Jarold Motto, PA Taking Active  Family Member  glucose blood (ACCU-CHEK GUIDE) test strip [id number] Yes USE 1 STRIP TO CHECK GLUCOSE THREE TIMES DAILY Shamleffer, Konrad Dolores, MD Taking Active Family Member  hydrALAZINE (APRESOLINE) 25 MG tablet [id number] Yes TAKE 1 TABLET BY MOUTH THREE TIMES DAILY Jarold Motto, PA Taking Active Family Member  insulin aspart (NOVOLOG FLEXPEN) 100 UNIT/ML  FlexPen [id number] Yes Inject 12 Units into the skin 3 (three) times daily with meals. Shamleffer, Konrad Dolores, MD Taking Active Family Member  insulin glargine (LANTUS SOLOSTAR) 100 UNIT/ML Solostar Pen [id number] Yes Inject 44 Units into the skin daily. Shamleffer, Konrad Dolores, MD Taking Active Family Member  Insulin Pen Needle (BD PEN NEEDLE NANO U/F) 32G X 4 MM MISC [id number] Yes Inject 1 Device into the skin in the morning, at noon, in the evening, and at bedtime. Shamleffer, Konrad Dolores, MD Taking Active Family Member  isosorbide mononitrate (IMDUR) 30 MG 24 hr tablet [id number] Yes Take 2 tablets by mouth once daily Jarold Motto, [place] Taking Active Family Member  levETIRAcetam (KEPPRA) 250 MG tablet [id number] Yes Take 1 tablet (250 mg total) by mouth 2 (two) times daily. Maurilio Lovely D, DO Taking Active Family Member  timolol (TIMOPTIC) 0.5 % ophthalmic solution [id number] Yes Place 1 drop into both eyes 2 (two) times daily. [provider] Taking Active Family Member  tobramycin (TOBREX) 0.3 % ophthalmic solution [id number] Yes Place 1 drop into both eyes 4 (four) times daily. [provider] Taking Active Family Member            Home Care and Equipment/Supplies: Were Home Health Services Ordered?: Yes Name of Home Health Agency:: [facility] Has Agency set up a time to come to your home?: Yes First Home Health Visit Date: 09/30/23 (PT will be seeing pt in the home today) Any new equipment or medical supplies ordered?: No  Functional Questionnaire: Do you need assistance with bathing/showering or dressing?: Yes (shower seat) Do you need assistance with meal preparation?: Yes (in home aide assists) Do you need assistance with eating?: No Do you need assistance with getting out of bed/getting out of a chair/moving?: No Do you have difficulty managing or taking your medications?: Yes (sister prefills med box)  Follow up appointments reviewed: PCP Follow-up  appointment confirmed?: Yes Date of PCP follow-up appointment?: 10/06/23 Follow-up Provider: Jarold Motto PA  120 pm Specialist Hospital Follow-up appointment confirmed?: No Reason Specialist Follow-Up Not Confirmed: Patient has Specialist Provider Number and will Call for Appointment Do you need transportation to your follow-up appointment?: No Do you understand care options if your condition(s) worsen?: Yes-patient verbalized understanding  SDOH Interventions Today    Flowsheet Row Most Recent Value  SDOH Interventions   Food Insecurity Interventions Intervention Not Indicated  Housing Interventions Intervention Not Indicated  Transportation Interventions Intervention Not Indicated  Utilities Interventions Intervention Not Indicated       Irving Shows [facility], BSN RN Care Manager/ Transition of Care [place]/ [facility] [phone]

## 2023-09-30 NOTE — Telephone Encounter (Signed)
 Home Health orders signed and faxed to [facility].

## 2023-09-30 NOTE — Telephone Encounter (Unsigned)
 Copied from CRM [id number]. Topic: Clinical - Home Health Verbal Orders >> Sep 30, 2023  3:17 PM Irine Seal wrote: Caller/Agency: Stacy PT [facility]  Callback Number: [phone] (secure VM)  Service Requested: Physical Therapy Frequency: 2 weeks- 2, 1 week- 5  Any new concerns about the patient? No

## 2023-10-01 NOTE — Telephone Encounter (Signed)
 Called [facility] PT [facility], verbal orders given for  Service Requested: Physical Therapy Frequency: 2 x's a week for 2 weeks, 1 x a week for 5 weeks. Told her okay for pt per [facility]. Stacy verbalized understanding.

## 2023-10-02 ENCOUNTER — Telehealth: Payer: Self-pay

## 2023-10-02 NOTE — Telephone Encounter (Signed)
 Copied from CRM [id number]. Topic: Clinical - Home Health Verbal Orders >> Oct 02, 2023  2:08 PM Isabell A wrote: Caller/Agency: [facility] from [employer] Number: [id number] Service Requested: Occupational Therapy Frequency: 1x/7 weeks Any new concerns about the patient? No  Please see message and advise on verbal orders

## 2023-10-03 ENCOUNTER — Telehealth: Payer: Self-pay | Admitting: *Deleted

## 2023-10-03 NOTE — Telephone Encounter (Signed)
 Copied from CRM [id number]. Topic: Clinical - Home Health Verbal Orders >> Oct 03, 2023 10:14 AM Kathryne Eriksson wrote: Caller/Agency: [facility] Callback Number: [phone] Service Requested: Speech Therapy Frequency: 1 x Week For 7 Weeks  Any new concerns about the patient? No

## 2023-10-06 ENCOUNTER — Ambulatory Visit (INDEPENDENT_AMBULATORY_CARE_PROVIDER_SITE_OTHER): Payer: 59 | Admitting: Internal Medicine

## 2023-10-06 ENCOUNTER — Encounter: Payer: Self-pay | Admitting: Internal Medicine

## 2023-10-06 ENCOUNTER — Inpatient Hospital Stay: Payer: 59 | Admitting: Physician Assistant

## 2023-10-06 VITALS — BP 122/80 | HR 83 | Ht 67.0 in | Wt 235.0 lb

## 2023-10-06 DIAGNOSIS — E1122 Type 2 diabetes mellitus with diabetic chronic kidney disease: Secondary | ICD-10-CM

## 2023-10-06 DIAGNOSIS — Z794 Long term (current) use of insulin: Secondary | ICD-10-CM

## 2023-10-06 DIAGNOSIS — E1143 Type 2 diabetes mellitus with diabetic autonomic (poly)neuropathy: Secondary | ICD-10-CM | POA: Diagnosis not present

## 2023-10-06 DIAGNOSIS — E1165 Type 2 diabetes mellitus with hyperglycemia: Secondary | ICD-10-CM

## 2023-10-06 DIAGNOSIS — N1831 Chronic kidney disease, stage 3a: Secondary | ICD-10-CM

## 2023-10-06 DIAGNOSIS — E1159 Type 2 diabetes mellitus with other circulatory complications: Secondary | ICD-10-CM

## 2023-10-06 LAB — POCT GLYCOSYLATED HEMOGLOBIN (HGB A1C): Hemoglobin A1C: 7.9 % — AB (ref 4.0–5.6)

## 2023-10-06 MED ORDER — TRULICITY 4.5 MG/0.5ML ~~LOC~~ SOAJ: 4.5000 mg | Freq: Every day | SUBCUTANEOUS | 3 refills | Status: DC

## 2023-10-06 MED ORDER — NOVOLOG FLEXPEN 100 UNIT/ML ~~LOC~~ SOPN: 12.0000 [IU] | PEN_INJECTOR | Freq: Three times a day (TID) | SUBCUTANEOUS | 3 refills | Status: DC

## 2023-10-06 MED ORDER — BD PEN NEEDLE NANO U/F 32G X 4 MM MISC: 1.0000 | Freq: Four times a day (QID) | 3 refills | Status: DC

## 2023-10-06 MED ORDER — LANTUS SOLOSTAR 100 UNIT/ML ~~LOC~~ SOPN: 44.0000 [IU] | PEN_INJECTOR | Freq: Every day | SUBCUTANEOUS | 3 refills | Status: DC

## 2023-10-06 MED ORDER — EMPAGLIFLOZIN 25 MG PO TABS: 25.0000 mg | ORAL_TABLET | Freq: Every day | ORAL | 3 refills | Status: DC

## 2023-10-06 NOTE — Progress Notes (Incomplete)
 Robin [name] is a 58 y.o. female here for a {New prob or follow up:31724}.  History of Present Illness:   No chief complaint on file.   HPI Patient presented to the ED on 2-1 with complains of acute onset of altered mental status with reported fever with recent confusion and diminished appetite. Labs revealed hyponatremia and AKI. EKG was A-fib RVR. Chest x-ray indicated  vascular congestion. She was started on broad-spectrum antibiotics. Respiratory pathogens tested positive for coronavirus NL 63. Procalcitonin was unremarkable. Blood cultures remained negative, CT chest abdomen pelvis without any acute abnormality, UA unremarkable. Her altered mental status was believed to be an acute metabolic encephalopathy secondary to her upper respiratory infection.    Today,   Past Medical History:  Diagnosis Date   Allergy    Anemia    Aortic stenosis    a. 02/2023 Echo: mild-mod AS.   Arthritis    Chronic abdominal pain    Chronic HFrEF (heart failure with reduced ejection fraction) (HCC)    a. EF at 30-35% by echo in 08/2018 b. EF at 35% by repeat echo in 05/2019 c. EF improved to 65-70% in 2021 d. EF at 40-45% in 08/2021; e. 02/2023 Echo: 40-45%, glob HK, mod LVH, nl RV size/fx, sev BAE, mild-mod AS.   Cocaine abuse (HCC)    COPD (chronic obstructive pulmonary disease) (HCC)    Diabetes mellitus without complication (HCC)    Essential hypertension, benign    Expressive aphasia    post CVA   Fatty liver    GERD (gastroesophageal reflux disease)    Gout 2016   Normal coronary arteries    a. 10/2008 abnl MV; b. 10/2008 Cath: nl cors.   Ovarian cyst    Permanent atrial fibrillation (HCC)    Stroke (HCC) 12/26/2019   Right sided weakness, and expressive aphasia   Thoracic ascending aortic aneurysm (HCC)    a. 4 cm 10/31/19 CTA; b. 02/2023 Echo: Nl Ao root.   Type 2 diabetes mellitus (HCC)    type II     Social History   Tobacco Use   Smoking status: Former    Current packs/day: 0.00     Types: Cigarettes    Start date: 12/1990    Quit date: 12/2019    Years since quitting: 3.8   Smokeless tobacco: Never  Vaping Use   Vaping status: Never Used  Substance Use Topics   Alcohol use: Not Currently    Comment: occ   Drug use: Not Currently    Past Surgical History:  Procedure Laterality Date   ABDOMINAL HYSTERECTOMY  09/10/2011   Procedure: HYSTERECTOMY ABDOMINAL;  Surgeon: Tilda Burrow, MD;  Location: [facility];  Service: Gynecology;  Laterality: N/A;  Abdominal hysterectomy   CESAREAN SECTION  [phone], and 1994   CHOLECYSTECTOMY  1995   IR 3D INDEPENDENT WKST  03/16/2020   IR ANGIO INTRA EXTRACRAN SEL INTERNAL CAROTID BILAT MOD SED  03/16/2020   IR ANGIO VERTEBRAL SEL SUBCLAVIAN INNOMINATE UNI L MOD SED  03/16/2020   IR ANGIO VERTEBRAL SEL VERTEBRAL UNI R MOD SED  03/16/2020   IR CT HEAD LTD  12/27/2019   IR PERCUTANEOUS ART THROMBECTOMY/INFUSION INTRACRANIAL INC DIAG ANGIO  12/27/2019   IR US GUIDE VASC ACCESS RIGHT  12/27/2019   IR US GUIDE VASC ACCESS RIGHT  03/16/2020   RADIOLOGY WITH ANESTHESIA N/A 12/27/2019   Procedure: IR WITH ANESTHESIA;  Surgeon: Julieanne Cotton, MD;  Location: [facility];  Service: Radiology;  Laterality:  N/A;   SCAR REVISION  09/10/2011   Procedure: SCAR REVISION;  Surgeon: Tilda Burrow, MD;  Location: [facility];  Service: Gynecology;  Laterality: N/A;  Wide Excision of old Cicatrix   TUBAL LIGATION  1994    Family History  Problem Relation Age of Onset   Cirrhosis Mother    Early death Mother    Alcohol abuse Mother    Diabetes type II Father    Alcohol abuse Father    Diabetes type II Sister    Early death Brother    Alcohol abuse Son    Anesthesia problems Neg Hx    Hypotension Neg Hx    Malignant hyperthermia Neg Hx    Pseudochol deficiency Neg Hx    Colon cancer Neg Hx    Colon polyps Neg Hx    Esophageal cancer Neg Hx    Rectal cancer Neg Hx    Stomach cancer Neg Hx     Allergies  Allergen Reactions   Bee Venom Shortness  Of Breath and Swelling    Bodily Swelling   Losartan Other (See Comments)    Nosebleeds   Naproxen Other (See Comments)    Acid reflux   Lisinopril Other (See Comments)    Sinus congestion   Penicillins Nausea Only    Current Medications:   Current Outpatient Medications:    Accu-Chek Softclix Lancets lancets, Use as instructed, Disp: 100 each, Rfl: 12   apixaban (ELIQUIS) 5 MG TABS tablet, Take 1 tablet by mouth twice daily, Disp: 180 tablet, Rfl: 1   aspirin EC 81 MG tablet, Take 1 tablet (81 mg total) by mouth daily. Swallow whole., Disp: 90 tablet, Rfl: 3   Blood Glucose Monitoring Suppl (ACCU-CHEK GUIDE ME) w/Device KIT, 1 Device by Does not apply route 3 (three) times daily., Disp: 1 kit, Rfl: 0   carvedilol (COREG) 25 MG tablet, TAKE 2 TABLETS BY MOUTH TWICE DAILY WITH A MEAL, Disp: 360 tablet, Rfl: 1   dicyclomine (BENTYL) 20 MG tablet, Take 20 mg by mouth 3 (three) times daily before meals., Disp: , Rfl:    Dulaglutide (TRULICITY) 3 MG/0.5ML SOPN, Inject 3 mg as directed once a week., Disp: 6 mL, Rfl: 3   empagliflozin (JARDIANCE) 25 MG TABS tablet, Take 1 tablet (25 mg total) by mouth daily before breakfast., Disp: 90 tablet, Rfl: 3   ferrous sulfate (FEROSUL) 325 (65 FE) MG tablet, Take 1 tablet by mouth once daily, Disp: 90 tablet, Rfl: 1   furosemide (LASIX) 20 MG tablet, TAKE 1 TABLET BY MOUTH ONCE DAILY (MAY TAKE ADDITIONAL 20 MG IF WEIGHT GAIN IS OVER 2LBS OVERNIGHT OR 5 LBS IN A WEEK), Disp: 105 tablet, Rfl: 3   gabapentin (NEURONTIN) 100 MG capsule, TAKE 2 CAPSULES BY MOUTH THREE TIMES DAILY, Disp: 180 capsule, Rfl: 2   glucose blood (ACCU-CHEK GUIDE) test strip, USE 1 STRIP TO CHECK GLUCOSE THREE TIMES DAILY, Disp: 300 each, Rfl: 3   hydrALAZINE (APRESOLINE) 25 MG tablet, TAKE 1 TABLET BY MOUTH THREE TIMES DAILY, Disp: 270 tablet, Rfl: 0   insulin aspart (NOVOLOG FLEXPEN) 100 UNIT/ML FlexPen, Inject 12 Units into the skin 3 (three) times daily with meals., Disp: 45 mL,  Rfl: 3   insulin glargine (LANTUS SOLOSTAR) 100 UNIT/ML Solostar Pen, Inject 44 Units into the skin daily., Disp: 45 mL, Rfl: 3   Insulin Pen Needle (BD PEN NEEDLE NANO U/F) 32G X 4 MM MISC, Inject 1 Device into the skin in the morning, at noon,  in the evening, and at bedtime., Disp: 400 each, Rfl: 3   isosorbide mononitrate (IMDUR) 30 MG 24 hr tablet, Take 2 tablets by mouth once daily, Disp: 180 tablet, Rfl: 0   levETIRAcetam (KEPPRA) 250 MG tablet, Take 1 tablet (250 mg total) by mouth 2 (two) times daily., Disp: 60 tablet, Rfl: 2   timolol (TIMOPTIC) 0.5 % ophthalmic solution, Place 1 drop into both eyes 2 (two) times daily., Disp: , Rfl:    tobramycin (TOBREX) 0.3 % ophthalmic solution, Place 1 drop into both eyes 4 (four) times daily., Disp: , Rfl:    Review of Systems:   ROS  Vitals:   There were no vitals filed for this visit.   There is no height or weight on file to calculate BMI.  Physical Exam:   Physical Exam  Assessment and Plan:   There are no diagnoses linked to this encounter.   Jarold Motto, PA-C  I,Safa M Kadhim,acting as a scribe for Jarold Motto, PA.,have documented all relevant documentation on the behalf of Jarold Motto, PA,as directed by  Jarold Motto, PA while in the presence of Jarold Motto, [place].   I, Jarold Motto, [place], have reviewed all documentation for this visit. The documentation on 10/06/23 for the exam, diagnosis, procedures, and orders are all accurate and complete.

## 2023-10-06 NOTE — Telephone Encounter (Signed)
 Called Myrtie Neither with [facility] left message on personal voicemail, verbal orders given for Speech Therapy,  Frequency: 1 x Week for 7 Weeks, okay per [facility] for patient. Any questions please call back.

## 2023-10-06 NOTE — Progress Notes (Signed)
 Name: Robin [name]  Age/ Sex: 58 y.o., female   MRN/ DOB: [id number], 1966/03/20     PCP: Jarold Motto, PA   Reason for Endocrinology Evaluation: Type 2 Diabetes Mellitus  Initial Endocrine Consultative Visit: 10/01/2018    PATIENT IDENTIFIER: Robin [name] is a 58 y.o. female with a past medical history of T2DM, CHF, COPD , CVA, A.Fib with RVR and GERD. The patient has followed with Endocrinology clinic since 10/01/2018 for consultative assistance with management of her diabetes.  DIABETIC HISTORY:  Robin [name] was diagnosed with DM > 20 yrs ago. Has been on Janumet and Glipizide in the past. Has been on insulin for years. Her hemoglobin A1c has ranged from 7.9% in 2017, peaking at 11.3% in 2020  Started trulicity 06/2021 Started  Jardiance 04/2022  SUBJECTIVE:   During the last visit (05/09/2023): A1c 8.8%  Today (10/06/2023): Robin [name] is here for a follow up on diabetes management.  She is accompanied by her sister today.  She checks glucose 2-3x  a day. No hypoglycemia    She  follows with cardiology for A-fib, CHF  She presented to the ED 09/07/2023 with focal seizures She has noted headaches  Denies nausea or vomiting  Has noted constipation - on Miralax    HOME DIABETES REGIMEN:  Jardiance 25 mg daily  Trulicity 3 mg weekly  Lantus 44 units daily NovoLog 12 units 3 times daily before every meal    METER DOWNLOAD SUMMARY: unable to download 119 -224  mg/dL    DIABETIC COMPLICATIONS: Microvascular complications:  Neuropathy Denies: CKD, retinopathy  Last eye exam: Completed many years ago    Macrovascular complications:  CVA 01/2020 Denies: CAD, PVA    HISTORY:  Past Medical History:  Past Medical History:  Diagnosis Date   Allergy    Anemia    Aortic stenosis    a. 02/2023 Echo: mild-mod AS.   Arthritis    Chronic abdominal pain    Chronic HFrEF (heart failure with reduced ejection fraction) (HCC)    a. EF at 30-35% by echo in 08/2018 b.  EF at 35% by repeat echo in 05/2019 c. EF improved to 65-70% in 2021 d. EF at 40-45% in 08/2021; e. 02/2023 Echo: 40-45%, glob HK, mod LVH, nl RV size/fx, sev BAE, mild-mod AS.   Cocaine abuse (HCC)    COPD (chronic obstructive pulmonary disease) (HCC)    Diabetes mellitus without complication (HCC)    Essential hypertension, benign    Expressive aphasia    post CVA   Fatty liver    GERD (gastroesophageal reflux disease)    Gout 2016   Normal coronary arteries    a. 10/2008 abnl MV; b. 10/2008 Cath: nl cors.   Ovarian cyst    Permanent atrial fibrillation (HCC)    Stroke (HCC) 12/26/2019   Right sided weakness, and expressive aphasia   Thoracic ascending aortic aneurysm (HCC)    a. 4 cm 10/31/19 CTA; b. 02/2023 Echo: Nl Ao root.   Type 2 diabetes mellitus (HCC)    type II   Past Surgical History:  Past Surgical History:  Procedure Laterality Date   ABDOMINAL HYSTERECTOMY  09/10/2011   Procedure: HYSTERECTOMY ABDOMINAL;  Surgeon: Tilda Burrow, MD;  Location: [facility];  Service: Gynecology;  Laterality: N/A;  Abdominal hysterectomy   CESAREAN SECTION  4098,1191, and 1994   CHOLECYSTECTOMY  1995   IR 3D INDEPENDENT WKST  03/16/2020   IR ANGIO INTRA EXTRACRAN SEL INTERNAL CAROTID  BILAT MOD SED  03/16/2020   IR ANGIO VERTEBRAL SEL SUBCLAVIAN INNOMINATE UNI L MOD SED  03/16/2020   IR ANGIO VERTEBRAL SEL VERTEBRAL UNI R MOD SED  03/16/2020   IR CT HEAD LTD  12/27/2019   IR PERCUTANEOUS ART THROMBECTOMY/INFUSION INTRACRANIAL INC DIAG ANGIO  12/27/2019   IR US GUIDE VASC ACCESS RIGHT  12/27/2019   IR US GUIDE VASC ACCESS RIGHT  03/16/2020   RADIOLOGY WITH ANESTHESIA N/A 12/27/2019   Procedure: IR WITH ANESTHESIA;  Surgeon: Julieanne Cotton, MD;  Location: [facility];  Service: Radiology;  Laterality: N/A;   SCAR REVISION  09/10/2011   Procedure: SCAR REVISION;  Surgeon: Tilda Burrow, MD;  Location: [facility];  Service: Gynecology;  Laterality: N/A;  Wide Excision of old Cicatrix   TUBAL LIGATION  1994    Social History:  reports that she quit smoking about 3 years ago. Her smoking use included cigarettes. She started smoking about 32 years ago. She has never used smokeless tobacco. She reports that she does not currently use alcohol. She reports that she does not currently use drugs. Family History:  Family History  Problem Relation Age of Onset   Cirrhosis Mother    Early death Mother    Alcohol abuse Mother    Diabetes type II Father    Alcohol abuse Father    Diabetes type II Sister    Early death Brother    Alcohol abuse Son    Anesthesia problems Neg Hx    Hypotension Neg Hx    Malignant hyperthermia Neg Hx    Pseudochol deficiency Neg Hx    Colon cancer Neg Hx    Colon polyps Neg Hx    Esophageal cancer Neg Hx    Rectal cancer Neg Hx    Stomach cancer Neg Hx      HOME MEDICATIONS: Allergies as of 10/06/2023       Reactions   Bee Venom Shortness Of Breath, Swelling   Bodily Swelling   Losartan Other (See Comments)   Nosebleeds   Naproxen Other (See Comments)   Acid reflux   Lisinopril Other (See Comments)   Sinus congestion   Penicillins Nausea Only        Medication List        Accurate as of October 06, 2023  2:42 PM. If you have any questions, ask your nurse or doctor.          Accu-Chek Guide Me w/Device Kit 1 Device by Does not apply route 3 (three) times daily.   Accu-Chek Guide test strip Generic drug: glucose blood USE 1 STRIP TO CHECK GLUCOSE THREE TIMES DAILY   Accu-Chek Softclix Lancets lancets Use as instructed   aspirin EC 81 MG tablet Take 1 tablet (81 mg total) by mouth daily. Swallow whole.   BD Pen Needle Nano U/F 32G X 4 MM Misc Generic drug: Insulin Pen Needle Inject 1 Device into the skin in the morning, at noon, in the evening, and at bedtime.   carvedilol 25 MG tablet Commonly known as: COREG TAKE 2 TABLETS BY MOUTH TWICE DAILY WITH A MEAL   dicyclomine 20 MG tablet Commonly known as: BENTYL Take 20 mg by mouth 3  (three) times daily before meals.   Eliquis 5 MG Tabs tablet Generic drug: apixaban Take 1 tablet by mouth twice daily   empagliflozin 25 MG Tabs tablet Commonly known as: Jardiance Take 1 tablet (25 mg total) by mouth daily before breakfast.   FeroSul 325 (  65 Fe) MG tablet Generic drug: ferrous sulfate Take 1 tablet by mouth once daily   furosemide 20 MG tablet Commonly known as: LASIX TAKE 1 TABLET BY MOUTH ONCE DAILY (MAY TAKE ADDITIONAL 20 MG IF WEIGHT GAIN IS OVER 2LBS OVERNIGHT OR 5 LBS IN A WEEK)   gabapentin 100 MG capsule Commonly known as: NEURONTIN TAKE 2 CAPSULES BY MOUTH THREE TIMES DAILY   hydrALAZINE 25 MG tablet Commonly known as: APRESOLINE TAKE 1 TABLET BY MOUTH THREE TIMES DAILY   isosorbide mononitrate 30 MG 24 hr tablet Commonly known as: IMDUR Take 2 tablets by mouth once daily   Lantus SoloStar 100 UNIT/ML Solostar Pen Generic drug: insulin glargine Inject 44 Units into the skin daily.   levETIRAcetam 250 MG tablet Commonly known as: KEPPRA Take 1 tablet (250 mg total) by mouth 2 (two) times daily.   NovoLOG FlexPen 100 UNIT/ML FlexPen Generic drug: insulin aspart Inject 12 Units into the skin 3 (three) times daily with meals.   timolol 0.5 % ophthalmic solution Commonly known as: TIMOPTIC Place 1 drop into both eyes 2 (two) times daily.   tobramycin 0.3 % ophthalmic solution Commonly known as: TOBREX Place 1 drop into both eyes 4 (four) times daily.   Trulicity 3 MG/0.5ML Soaj Generic drug: Dulaglutide Inject 3 mg as directed once a week.         OBJECTIVE:   Vital Signs: LMP 08/21/2011   Wt Readings from Last 3 Encounters:  09/24/23 240 lb (108.9 kg)  09/07/23 240 lb 4.8 oz (109 kg)  06/30/23 242 lb (109.8 kg)     Exam: General: Pt appears well and is in NAD  Lungs: Clear with good BS bilat   Heart: RRR   Neuro: MS is good with appropriate affect, pt is alert and Ox3    DM foot exam:05/09/2023 per podiatry  The  skin of the feet is intact without sores or ulcerations. The pedal pulses are 2+ The sensation is decreased  to a screening 5.07, 10 gram monofilament bilaterally        DATA REVIEWED:  Lab Results  Component Value Date   HGBA1C 8.8 (A) 05/09/2023   HGBA1C 8.2 (H) 12/02/2022   HGBA1C 7.5 (A) 10/30/2022    Latest Reference Range & Units 09/28/23 06:52  Sodium 135 - 145 mmol/L 138  Potassium 3.5 - 5.1 mmol/L 3.7  Chloride 98 - 111 mmol/L 108  CO2 22 - 32 mmol/L 23  Glucose 70 - 99 mg/dL 086 (H)  BUN 6 - 20 mg/dL 20  Creatinine 5.78 - 4.69 mg/dL 6.29 (H)  Calcium 8.9 - 10.3 mg/dL 8.9  Anion gap 5 - 15  7  Phosphorus 2.5 - 4.6 mg/dL 2.7  Magnesium 1.7 - 2.4 mg/dL 2.0  Alkaline Phosphatase 38 - 126 U/L 70  Albumin 3.5 - 5.0 g/dL 3.5  AST 15 - 41 U/L 20  ALT 0 - 44 U/L 14  Total Protein 6.5 - 8.1 g/dL 7.0  (H): Data is abnormally high  ASSESSMENT / PLAN / RECOMMENDATIONS:   1) Type 2 Diabetes Mellitus, Sub-Optimally controlled, With Neuropathic and CKD III  and macrovascular complications - Most recent A1c of  7.9  %. Goal A1c < 7.0 %.    -Metformin has been stopped due to low GFR -Main barriers to diabetes self care is memory issues and CVA -She was not able to obtain the Dexcom -A1c trending down, but continues to be above goal.  This is due to postprandial hyperglycemia -  Patient has home health aides and they sure proper intake of medication -Will increase Trulicity as below  MEDICATIONS: Continue  Lantus  44 units daily Continue NovoLog 12 units with each meal Increase trulicity 4.5 mg weekly  Continue  Jardiance 25 mg daily  EDUCATION / INSTRUCTIONS: BG monitoring instructions: Patient is instructed to check her blood sugars 2 times a day, fasting and supper. Call [facility] if: BG persistently < 70  I reviewed the Rule of 15 for the treatment of hypoglycemia in detail with the patient. Literature supplied.   2) Diabetic complications:  Eye:  Does not have known diabetic retinopathy.   Neuro/ Feet: Does  have known diabetic peripheral neuropathy .  Renal: Patient does  have known baseline CKD     F/U in 6 months    Signed electronically by: Lyndle Herrlich, MD  [facility] Endocr[facility]  [facility] [address] [place], [name] [address] Phone: [phone] FAX: [phone]   CC: Jarold Motto, [place] [address] [name] [address] Phone: [phone]  Fax: [phone]  Return to Endocrinology clinic as below: Future Appointments  Date Time Provider Department Center  10/09/2023 11:00 AM Jarold Motto, [place] [facility]  12/02/2023  1:15 PM Windell Norfolk, MD [facility] None  01/12/2024  1:00 PM Branch, Dorothe Pea, MD [facility]

## 2023-10-06 NOTE — Patient Instructions (Signed)
-   Increase Trulicity 4.5 mg ONCE weekly  - Continue  Jardiance 25 mg daily  - Continue Lantus  44  units daily  - Continue Novolog 12 units with Breakfast, Lunch and Dinner       HOW TO TREAT LOW BLOOD SUGARS (Blood sugar LESS THAN 70 MG/DL) Please follow the RULE OF 15 for the treatment of hypoglycemia treatment (when your (blood sugars are less than 70 mg/dL)   STEP 1: Take 15 grams of carbohydrates when your blood sugar is low, which includes:  3-4 GLUCOSE TABS  OR 3-4 OZ OF JUICE OR REGULAR SODA OR ONE TUBE OF GLUCOSE GEL    STEP 2: RECHECK blood sugar in 15 MINUTES STEP 3: If your blood sugar is still low at the 15 minute recheck --> then, go back to STEP 1 and treat AGAIN with another 15 grams of carbohydrates.

## 2023-10-07 NOTE — Telephone Encounter (Signed)
 Left message on voicemail to call office.

## 2023-10-09 ENCOUNTER — Encounter: Payer: Self-pay | Admitting: Physician Assistant

## 2023-10-09 ENCOUNTER — Ambulatory Visit: Admitting: Physician Assistant

## 2023-10-09 VITALS — BP 113/83 | Temp 97.8°F | Ht 67.0 in | Wt 235.8 lb

## 2023-10-09 DIAGNOSIS — N1832 Chronic kidney disease, stage 3b: Secondary | ICD-10-CM

## 2023-10-09 DIAGNOSIS — Z794 Long term (current) use of insulin: Secondary | ICD-10-CM

## 2023-10-09 DIAGNOSIS — E1143 Type 2 diabetes mellitus with diabetic autonomic (poly)neuropathy: Secondary | ICD-10-CM | POA: Diagnosis not present

## 2023-10-09 DIAGNOSIS — Z1231 Encounter for screening mammogram for malignant neoplasm of breast: Secondary | ICD-10-CM

## 2023-10-09 DIAGNOSIS — R569 Unspecified convulsions: Secondary | ICD-10-CM | POA: Diagnosis not present

## 2023-10-09 DIAGNOSIS — Z0001 Encounter for general adult medical examination with abnormal findings: Secondary | ICD-10-CM

## 2023-10-09 DIAGNOSIS — Z23 Encounter for immunization: Secondary | ICD-10-CM | POA: Diagnosis not present

## 2023-10-09 DIAGNOSIS — Z0000 Encounter for general adult medical examination without abnormal findings: Secondary | ICD-10-CM

## 2023-10-09 DIAGNOSIS — K625 Hemorrhage of anus and rectum: Secondary | ICD-10-CM

## 2023-10-09 DIAGNOSIS — R519 Headache, unspecified: Secondary | ICD-10-CM | POA: Diagnosis not present

## 2023-10-09 DIAGNOSIS — Z1211 Encounter for screening for malignant neoplasm of colon: Secondary | ICD-10-CM

## 2023-10-09 DIAGNOSIS — Z7984 Long term (current) use of oral hypoglycemic drugs: Secondary | ICD-10-CM

## 2023-10-09 DIAGNOSIS — Z87891 Personal history of nicotine dependence: Secondary | ICD-10-CM

## 2023-10-09 LAB — COMPREHENSIVE METABOLIC PANEL
ALT: 11 U/L (ref 0–35)
AST: 15 U/L (ref 0–37)
Albumin: 4 g/dL (ref 3.5–5.2)
Alkaline Phosphatase: 91 U/L (ref 39–117)
BUN: 21 mg/dL (ref 6–23)
CO2: 27 meq/L (ref 19–32)
Calcium: 9.6 mg/dL (ref 8.4–10.5)
Chloride: 103 meq/L (ref 96–112)
Creatinine, Ser: 1.86 mg/dL — ABNORMAL HIGH (ref 0.40–1.20)
GFR: 29.76 mL/min — ABNORMAL LOW (ref >60.00)
Glucose, Bld: 94 mg/dL (ref 70–99)
Potassium: 4.1 meq/L (ref 3.5–5.1)
Sodium: 138 meq/L (ref 135–145)
Total Bilirubin: 0.4 mg/dL (ref 0.2–1.2)
Total Protein: 7.4 g/dL (ref 6.0–8.3)

## 2023-10-09 LAB — CBC WITH DIFFERENTIAL/PLATELET
Basophils Absolute: 0 10*3/uL (ref 0.0–0.1)
Basophils Relative: 0.7 % (ref 0.0–3.0)
Eosinophils Absolute: 0.2 10*3/uL (ref 0.0–0.7)
Eosinophils Relative: 3.6 % (ref 0.0–5.0)
HCT: 43.3 % (ref 36.0–46.0)
Hemoglobin: 13.8 g/dL (ref 12.0–15.0)
Lymphocytes Relative: 36.4 % (ref 12.0–46.0)
Lymphs Abs: 2.2 10*3/uL (ref 0.7–4.0)
MCHC: 31.9 g/dL (ref 30.0–36.0)
MCV: 85.7 fl (ref 78.0–100.0)
Monocytes Absolute: 0.6 10*3/uL (ref 0.1–1.0)
Monocytes Relative: 9.6 % (ref 3.0–12.0)
Neutro Abs: 3 10*3/uL (ref 1.4–7.7)
Neutrophils Relative %: 49.7 % (ref 43.0–77.0)
Platelets: 294 10*3/uL (ref 150.0–400.0)
RBC: 5.05 Mil/uL (ref 3.87–5.11)
RDW: 15.7 % — ABNORMAL HIGH (ref 11.5–15.5)
WBC: 6 10*3/uL (ref 4.0–10.5)

## 2023-10-09 LAB — MICROALBUMIN / CREATININE URINE RATIO
Creatinine,U: 45.5 mg/dL
Microalb Creat Ratio: 61.7 mg/g — ABNORMAL HIGH (ref 0.0–30.0)
Microalb, Ur: 2.8 mg/dL — ABNORMAL HIGH (ref 0.0–1.9)

## 2023-10-09 NOTE — Telephone Encounter (Signed)
 Called Robin [name] with [facility] told her calling to give verbal orders for OT.  Robin [name] said she already spoke to someone and received orders. Told her okay.

## 2023-10-09 NOTE — Telephone Encounter (Signed)
 Called [facility] and left message for [facility] to call me.

## 2023-10-09 NOTE — Patient Instructions (Addendum)
 It was great to see you!  Pneumonia shot today  Tdap at pharmacy [facility] in future  For your headache(s) -Keppra can cause headache in 14-19% of patients -Tylenol/Acetaminophen is best for headaches  -- avoid ibuprofen/motrin/naproxen while on eliquis -Continue plan to work with eye doctor as your vision issues   I will resubmit referral to [facility] --> speech therapy, occupational therapy, physical therapy   Start capful of miralax daily -- continue Drink 64 oz water daily  Referral to gastroenterology   Let's follow-up in 3-6 months, sooner if you have concerns.  Take care,  Jarold Motto PA-C

## 2023-10-09 NOTE — Progress Notes (Signed)
 Subjective:    Robin [name] is a 58 y.o. female and is here for a comprehensive physical exam.  Health Maintenance Due  Topic Date Due   Medicare Annual Wellness (AWV)  Never done   Hepatitis C Screening  Never done   Zoster Vaccines- Shingrix (1 of 2) Never done   Colonoscopy  Never done   MAMMOGRAM  07/27/2016   Diabetic kidney evaluation - Urine ACR  06/08/2020   OPHTHALMOLOGY EXAM  08/07/2022   DTaP/Tdap/Td (2 - Td or Tdap) 06/17/2023    Acute Concerns: Rectal bleeding Pt complains of bleeding internal hemorrhoid that has persisted since last Monday.  She has pain while having a BM Associated symptoms include constant constipation which she would occasionally use Miralax for.    Denies any vaginal bleeding. Agreeable to a GI referral today to undergo colon cancer screening.   Headaches  Pt complains of headaches that has persisted for the past several weeks. Headaches are mild but without treatment she tends to be in an irritable mood.  She typically takes 2 ibuprofen daily to help manage it. She is overdue for need for getting glasses, has daily eye strain  Seizure-like activity She was seen in the ED in early February for confusion and twitching and what was a suspected as focal seizures as a result of underlying stroke. EEG performed and was wnl. Patient was diagnosis with new onset of focal epilepsy and was started on Keppra 250 mg twice daily.  She is scheduled with neurology (Dr. Shelby Mattocks) on 12/02/23.  Chronic Issues: Diabetes  Reports compliance and good tolerance of Trulicity 4.5 mg weekly and Jardiance 25 mg daily.  She is taking novolog 12 units three times daily w meals and lantus 44 units daily Condition continues to be managed by Endocrinology. Scheduled for a follow up tomorrow.   Last A1c was 7.9. No concerns or symptoms at this time.  Health Maintenance: Immunizations -- pneumonia shot today Colonoscopy -- N/A Mammogram -- overdue, counseled PAP  -- n/a Bone Density -- N/A Diet -- typical diet  Exercise -- no exercise   Sleep habits -- no complains  Mood -- stable   UTD with dentist? - yes UTD with eye doctor? - yes  Weight history: Wt Readings from Last 10 Encounters:  10/09/23 235 lb 12.8 oz (107 kg)  10/06/23 235 lb (106.6 kg)  09/24/23 240 lb (108.9 kg)  09/07/23 240 lb 4.8 oz (109 kg)  06/30/23 242 lb (109.8 kg)  05/09/23 236 lb (107 kg)  04/28/23 237 lb 6.4 oz (107.7 kg)  04/08/23 233 lb 11 oz (106 kg)  02/05/23 235 lb 6.1 oz (106.8 kg)  01/26/23 231 lb (104.8 kg)   Body mass index is 36.93 kg/m. Patient's last menstrual period was 08/21/2011.  Alcohol use:  reports that she does not currently use alcohol.  Tobacco use:  Tobacco Use: Medium Risk (10/09/2023)   Patient History    Smoking Tobacco Use: Former    Smokeless Tobacco Use: Never    Passive Exposure: Not on file   Eligible for lung cancer screening? yes     02/05/2023   11:07 AM  Depression screen PHQ 2/9  Decreased Interest 0  Down, Depressed, Hopeless 0  PHQ - 2 Score 0  Altered sleeping 0  Tired, decreased energy 0  Change in appetite 0  Feeling bad or failure about yourself  0  Trouble concentrating 0  Moving slowly or fidgety/restless 0  Suicidal thoughts 0  PHQ-9 Score 0  Difficult doing work/chores Not difficult at all     Other providers/specialists: Patient Care Team: Jarold Motto, [place] as PCP - General (Physician Assistant) Wyline Mood, Dorothe Pea, MD as PCP - Cardiology (Cardiology) Micki Riley, MD as Consulting Physician (Neurology) [facility], Konrad Dolores, MD as Consulting Physician (Endocrinology) Jarold Motto, [place] (Physician Assistant)    PMHx, SurgHx, SocialHx, Medications, and Allergies were reviewed in the Visit Navigator and updated as appropriate.   Past Medical History:  Diagnosis Date   Allergy    Anemia    Aortic stenosis    a. 02/2023 Echo: mild-mod AS.   Arthritis    Chronic abdominal pain     Chronic HFrEF (heart failure with reduced ejection fraction) (HCC)    a. EF at 30-35% by echo in 08/2018 b. EF at 35% by repeat echo in 05/2019 c. EF improved to 65-70% in 2021 d. EF at 40-45% in 08/2021; e. 02/2023 Echo: 40-45%, glob HK, mod LVH, nl RV size/fx, sev BAE, mild-mod AS.   Cocaine abuse (HCC)    COPD (chronic obstructive pulmonary disease) (HCC)    Diabetes mellitus without complication (HCC)    Essential hypertension, benign    Expressive aphasia    post CVA   Fatty liver    GERD (gastroesophageal reflux disease)    Gout 2016   Normal coronary arteries    a. 10/2008 abnl MV; b. 10/2008 Cath: nl cors.   Ovarian cyst    Permanent atrial fibrillation (HCC)    Stroke (HCC) 12/26/2019   Right sided weakness, and expressive aphasia   Thoracic ascending aortic aneurysm (HCC)    a. 4 cm 10/31/19 CTA; b. 02/2023 Echo: Nl Ao root.   Type 2 diabetes mellitus (HCC)    type II     Past Surgical History:  Procedure Laterality Date   ABDOMINAL HYSTERECTOMY  09/10/2011   Procedure: HYSTERECTOMY ABDOMINAL;  Surgeon: Tilda Burrow, MD;  Location: [facility];  Service: Gynecology;  Laterality: N/A;  Abdominal hysterectomy   CESAREAN SECTION  [phone], and 1994   CHOLECYSTECTOMY  1995   IR 3D INDEPENDENT WKST  03/16/2020   IR ANGIO INTRA EXTRACRAN SEL INTERNAL CAROTID BILAT MOD SED  03/16/2020   IR ANGIO VERTEBRAL SEL SUBCLAVIAN INNOMINATE UNI L MOD SED  03/16/2020   IR ANGIO VERTEBRAL SEL VERTEBRAL UNI R MOD SED  03/16/2020   IR CT HEAD LTD  12/27/2019   IR PERCUTANEOUS ART THROMBECTOMY/INFUSION INTRACRANIAL INC DIAG ANGIO  12/27/2019   IR US GUIDE VASC ACCESS RIGHT  12/27/2019   IR US GUIDE VASC ACCESS RIGHT  03/16/2020   RADIOLOGY WITH ANESTHESIA N/A 12/27/2019   Procedure: IR WITH ANESTHESIA;  Surgeon: Julieanne Cotton, MD;  Location: [facility];  Service: Radiology;  Laterality: N/A;   SCAR REVISION  09/10/2011   Procedure: SCAR REVISION;  Surgeon: Tilda Burrow, MD;  Location: [facility];   Service: Gynecology;  Laterality: N/A;  Wide Excision of old Cicatrix   TUBAL LIGATION  1994     Family History  Problem Relation Age of Onset   Cirrhosis Mother    Early death Mother    Alcohol abuse Mother    Diabetes type II Father    Alcohol abuse Father    Diabetes type II Sister    Early death Brother    Alcohol abuse Son    Anesthesia problems Neg Hx    Hypotension Neg Hx    Malignant hyperthermia Neg Hx    Pseudochol  deficiency Neg Hx    Colon cancer Neg Hx    Colon polyps Neg Hx    Esophageal cancer Neg Hx    Rectal cancer Neg Hx    Stomach cancer Neg Hx     Social History   Tobacco Use   Smoking status: Former    Current packs/day: 0.00    Types: Cigarettes    Start date: 12/1990    Quit date: 12/2019    Years since quitting: 3.8   Smokeless tobacco: Never  Vaping Use   Vaping status: Never Used  Substance Use Topics   Alcohol use: Not Currently    Comment: occ   Drug use: Not Currently    Review of Systems:   Review of Systems  Constitutional:  Negative for chills, fever, malaise/fatigue and weight loss.  HENT:  Negative for hearing loss, sinus pain and sore throat.   Respiratory:  Negative for cough, hemoptysis and shortness of breath.   Cardiovascular:  Negative for chest pain, palpitations, leg swelling and PND.  Gastrointestinal:  Positive for constipation. Negative for abdominal pain, diarrhea, heartburn, nausea and vomiting.       +rectal bleeding  Genitourinary:  Negative for dysuria, frequency and urgency.  Musculoskeletal:  Negative for back pain, myalgias and neck pain.  Skin:  Negative for itching and rash.  Neurological:  Positive for headaches. Negative for dizziness, tingling and seizures.  Endo/Heme/Allergies:  Negative for polydipsia.  Psychiatric/Behavioral:  Negative for depression. The patient is not nervous/anxious.     Objective:   BP 113/83   Temp 97.8 F (36.6 C)   Ht 5\' 7"  (1.702 m)   Wt 235 lb 12.8 oz (107 kg)    LMP 08/21/2011   SpO2 98%   BMI 36.93 kg/m  Body mass index is 36.93 kg/m.   General Appearance:    Alert, cooperative, no distress, appears stated age  Head:    Normocephalic, without obvious abnormality, atraumatic  Eyes:    PERRL, conjunctiva/corneas clear, EOM's intact, fundi    benign, both eyes  Ears:    Normal TM's and external ear canals, both ears  Nose:   Nares normal, septum midline, mucosa normal, no drainage    or sinus tenderness  Throat:   Lips, mucosa, and tongue normal; teeth and gums normal  Neck:   Supple, symmetrical, trachea midline, no adenopathy;    thyroid:  no enlargement/tenderness/nodules; no carotid   bruit or JVD  Back:     Symmetric, no curvature, ROM normal, no CVA tenderness  Lungs:     Clear to auscultation bilaterally, respirations unlabored  Chest Wall:    No tenderness or deformity   Heart:    Regular rate and rhythm, S1 and S2 normal, no murmur, rub or gallop  Breast Exam:    Deferred  Abdomen:     Soft, non-tender, bowel sounds active all four quadrants,    no masses, no organomegaly  Genitalia:    Deferred   Extremities:   Extremities normal, atraumatic, no cyanosis or edema  Pulses:   2+ and symmetric all extremities  Skin:   Skin color, texture, turgor normal, no rashes or lesions  Lymph nodes:   Cervical, supraclavicular, and axillary nodes normal  Neurologic:   CNII-XII intact, normal strength, sensation and reflexes    throughout    Assessment/Plan:   Routine physical examination Today patient counseled on age appropriate routine health concerns for screening and prevention, each reviewed and up to date or declined. Immunizations reviewed  and up to date or declined. Labs ordered and reviewed. Risk factors for depression reviewed and negative. Hearing function and visual acuity are intact. ADLs screened and addressed as needed. Functional ability and level of safety reviewed and appropriate. Education, counseling and referrals performed  based on assessed risks today. Patient provided with a copy of personalized plan for preventive services.  Seizure-like activity (HCC) Close follow up with neurology (appointment next month) to discuss further management and concerns with possible headache(s) from keppra If new/worsening symptom(s)/changes -- needs to return to ER  Nonintractable headache, unspecified chronicity pattern, unspecified headache type No red flags Possibly related to keppra and need for vision correction Discussed avoiding NSAIDs due to Direct acting Oral AntiCoagulant (DOAC)  Consider tylenol, but use sparingly Follow up with neurology if ongoing If worsening, go to the emergency room   Rectal bleeding Discussed need for constipation prevention Start miralax daily Update colonoscopy - referral placed  Type 2 diabetes mellitus with diabetic autonomic neuropathy, with long-term current use of insulin (HCC) Reviewed most recent note Continue close follow up with endo  Will update uacr today  Need for pneumococcal 20-valent conjugate vaccination Updated today  Special screening for malignant neoplasms, colon Referral placed  Stopped smoking with greater than 20 pack year history Referral for lung cancer screening placed  Breast cancer screening by mammogram Mammogram referral placed  Stage 3b chronic kidney disease (CKD) (HCC) She is on Jardiance 25 mg daily Avoid NSAIDs Work towards better blood sugar and blood pressure management Consider renal referral if worsens   Jarold Motto, PA-C [place] [facility] [place]   I,Safa M Kadhim,acting as a Neurosurgeon for [employer], PA.,have documented all relevant documentation on the behalf of Jarold Motto, PA,as directed by  Jarold Motto, PA while in the presence of Jarold Motto, [place].   I, Jarold Motto, [place], have reviewed all documentation for this visit. The documentation on 10/09/23 for the exam, diagnosis, procedures, and orders are all  accurate and complete.

## 2023-10-09 NOTE — Telephone Encounter (Signed)
 Malorie from [facility] called you back:  [phone]

## 2023-10-13 ENCOUNTER — Other Ambulatory Visit: Payer: Self-pay | Admitting: Physician Assistant

## 2023-10-13 ENCOUNTER — Telehealth: Payer: Self-pay | Admitting: Physician Assistant

## 2023-10-13 ENCOUNTER — Other Ambulatory Visit: Payer: Self-pay | Admitting: Cardiology

## 2023-10-13 NOTE — Telephone Encounter (Signed)
 Received faxed  document Home Health Certificate (Order ID [id number]), to be filled out by provider. Patient requested to send it back via Fax . Document is located in providers tray at front office.Please advise

## 2023-10-14 ENCOUNTER — Other Ambulatory Visit: Payer: Self-pay | Admitting: *Deleted

## 2023-10-14 DIAGNOSIS — N1832 Chronic kidney disease, stage 3b: Secondary | ICD-10-CM

## 2023-10-15 NOTE — Telephone Encounter (Signed)
 Home Health forms signed and faxed back to [facility].

## 2023-10-20 ENCOUNTER — Telehealth: Payer: Self-pay | Admitting: *Deleted

## 2023-10-20 ENCOUNTER — Other Ambulatory Visit: Payer: Self-pay | Admitting: Physician Assistant

## 2023-10-20 MED ORDER — PANTOPRAZOLE SODIUM 40 MG PO TBEC: 40.0000 mg | DELAYED_RELEASE_TABLET | Freq: Every day | ORAL | 1 refills | Status: DC

## 2023-10-20 NOTE — Telephone Encounter (Signed)
 Copied from CRM [id number]. Topic: Clinical - Prescription Issue >> Oct 20, 2023 11:04 AM Eunice Blase wrote: Reason for CRM: [name] sister called stated pharmacy rejected pantoprazole which I don't see on pt's list of medications. Please call pt at [phone].

## 2023-10-20 NOTE — Telephone Encounter (Signed)
 Spoke to pt's daughter [name], told her the Rx was cancelled by another provider saying she was not taking it. Is patient taking Pantoprazole? [name] said yes, she is taking Pantoprazole 40 mg daily for her stomach. Told her okay I will send Rx to the pharmacy now, sorry about that. [name] verbalized understanding.

## 2023-10-31 ENCOUNTER — Other Ambulatory Visit: Payer: Self-pay | Admitting: *Deleted

## 2023-10-31 DIAGNOSIS — N1832 Chronic kidney disease, stage 3b: Secondary | ICD-10-CM

## 2023-10-31 NOTE — Progress Notes (Signed)
 [name] daughter [name] wants to do labs at [facility], orders for [facility] faxed to [facility] at [phone].

## 2023-12-02 ENCOUNTER — Encounter: Payer: Self-pay | Admitting: Neurology

## 2023-12-02 ENCOUNTER — Ambulatory Visit (INDEPENDENT_AMBULATORY_CARE_PROVIDER_SITE_OTHER): Payer: 59 | Admitting: Neurology

## 2023-12-02 VITALS — BP 137/82 | HR 70 | Ht 66.0 in | Wt 233.0 lb

## 2023-12-02 DIAGNOSIS — Z8673 Personal history of transient ischemic attack (TIA), and cerebral infarction without residual deficits: Secondary | ICD-10-CM | POA: Diagnosis not present

## 2023-12-02 DIAGNOSIS — R4701 Aphasia: Secondary | ICD-10-CM | POA: Diagnosis not present

## 2023-12-02 DIAGNOSIS — G8191 Hemiplegia, unspecified affecting right dominant side: Secondary | ICD-10-CM | POA: Diagnosis not present

## 2023-12-02 DIAGNOSIS — R569 Unspecified convulsions: Secondary | ICD-10-CM

## 2023-12-02 MED ORDER — OXCARBAZEPINE 300 MG PO TABS: 300.0000 mg | ORAL_TABLET | Freq: Two times a day (BID) | ORAL | 6 refills | Status: DC

## 2023-12-02 NOTE — Patient Instructions (Signed)
 Discontinue levetiracetam  due to side effect of constant headache Start oxcarbazepine, 300 mg, half tablet twice daily for 2 weeks then increase to full tablet twice daily Continue your other medications Please contact me if you have another seizure Follow-up in 6 months or sooner if worse.

## 2023-12-02 NOTE — Progress Notes (Signed)
 [facility]  PATIENT: Robin [name] DOB: [birth year]  REQUESTING CLINICIAN: Arleene Lack, MD HISTORY FROM: Patient and sister  REASON FOR VISIT: Seizure    HISTORICAL  CHIEF COMPLAINT:  Chief Complaint  Patient presents with   New Patient (Initial Visit)    Pt in 13, here with sister Robin [name]  Pt is here for ED referral for focal seizures. [name] sister states this is the first time pt experience something like this. States still unsure if in fact was a seizure. States that pt has been experiencing headaches with the Keppra .      HISTORY OF PRESENT ILLNESS:  This 58 year old woman past medical history of hypertension, hyperlipidemia, diabetes, left MCA stroke in 2021 with residual right-sided weakness and aphasia, who is presenting after being admitted to the hospital in February for seizure.  Patient was admitted to the hospital due to decreased responsiveness and right arm jerking.  Her MRI was negative for any acute stroke, EEG show continuous left hemispheric slowing.  Patient was thought to have seizures and started on Keppra  250 mg twice daily.  Since starting the Keppra , she has not had any additional events with complaint of daily headaches.  With the headache she takes Tylenol  which help.  Never had headaches in the past.  This all started since starting the Keppra .  She is accompanied by her sister today who tells me that patient has a history of spasms involving the right arm and legs and she is wondering if she was having spasm versus seizures.  Per chart review, patient was described to have right arm twitching.    Handedness: Left handed   Onset: 09/07/2023  Seizure Type: Right arm shaking    Current frequency: Only once   Any injuries from seizures: Denies   Seizure risk factors: Large left MCA stroke 12/2019  Previous ASMs: Levetiracetam    Currenty ASMs: Levetiracetam  250 mg twice daily   ASMs side effects: Headaches   Brain Images: Chronic  left MCA stroke   Previous EEGs: Left hemispheric slowing   OTHER MEDICAL CONDITIONS: Chronic L MCA stroke with residual right sided weakness and aphasia, hypertension, hyperlipidemia, Diabetes   REVIEW OF SYSTEMS: Full 14 system review of systems performed and negative with exception of: As noted in the HPI   ALLERGIES: Allergies  Allergen Reactions   Bee Venom Shortness Of Breath and Swelling    Bodily Swelling   Losartan  Other (See Comments)    Nosebleeds   Naproxen  Other (See Comments)    Acid reflux   Lisinopril  Other (See Comments)    Sinus congestion   Penicillins Nausea Only    HOME MEDICATIONS: Outpatient Medications Prior to Visit  Medication Sig Dispense Refill   Accu-Chek Softclix Lancets lancets Use as instructed 100 each 12   apixaban  (ELIQUIS ) 5 MG TABS tablet Take 1 tablet by mouth twice daily 180 tablet 1   aspirin  EC 81 MG tablet Take 1 tablet (81 mg total) by mouth daily. Swallow whole. 90 tablet 3   Blood Glucose Monitoring Suppl (ACCU-CHEK GUIDE ME) w/Device KIT 1 Device by Does not apply route 3 (three) times daily. 1 kit 0   carvedilol  (COREG ) 25 MG tablet TAKE 2 TABLETS BY MOUTH TWICE DAILY WITH A MEAL 360 tablet 1   Dulaglutide  (TRULICITY ) 4.5 MG/0.5ML SOAJ Inject 4.5 mg into the skin daily in the afternoon. 6 mL 3   empagliflozin  (JARDIANCE ) 25 MG TABS tablet Take 1 tablet (25 mg total) by mouth daily before  breakfast. 90 tablet 3   ferrous sulfate  (FEROSUL) 325 (65 FE) MG tablet Take 1 tablet by mouth once daily 90 tablet 1   furosemide  (LASIX ) 20 MG tablet TAKE 1 TABLET BY MOUTH ONCE DAILY (MAY TAKE ADDITIONAL 20 MG IF WEIGHT GAIN IS OVER 2LBS OVERNIGHT OR 5 LBS IN A WEEK) 105 tablet 1   gabapentin  (NEURONTIN ) 100 MG capsule TAKE 2 CAPSULES BY MOUTH THREE TIMES DAILY 180 capsule 2   glucose blood (ACCU-CHEK GUIDE) test strip USE 1 STRIP TO CHECK GLUCOSE THREE TIMES DAILY 300 each 3   hydrALAZINE  (APRESOLINE ) 25 MG tablet TAKE 1 TABLET BY MOUTH THREE  TIMES DAILY 270 tablet 0   insulin  aspart (NOVOLOG  FLEXPEN) 100 UNIT/ML FlexPen Inject 12 Units into the skin 3 (three) times daily with meals. 45 mL 3   insulin  glargine (LANTUS  SOLOSTAR) 100 UNIT/ML Solostar Pen Inject 44 Units into the skin daily. 45 mL 3   Insulin  Pen Needle (BD PEN NEEDLE NANO U/F) 32G X 4 MM MISC Inject 1 Device into the skin in the morning, at noon, in the evening, and at bedtime. 400 each 3   isosorbide  mononitrate (IMDUR ) 30 MG 24 hr tablet Take 2 tablets by mouth once daily 180 tablet 0   pantoprazole  (PROTONIX ) 40 MG tablet Take 1 tablet (40 mg total) by mouth daily. 90 tablet 1   timolol  (TIMOPTIC ) 0.5 % ophthalmic solution Place 1 drop into both eyes 2 (two) times daily.     tobramycin  (TOBREX ) 0.3 % ophthalmic solution Place 1 drop into both eyes 4 (four) times daily.     levETIRAcetam  (KEPPRA ) 250 MG tablet Take 1 tablet (250 mg total) by mouth 2 (two) times daily. 60 tablet 2   No facility-administered medications prior to visit.    PAST MEDICAL HISTORY: Past Medical History:  Diagnosis Date   Allergy    Anemia    Aortic stenosis    a. 02/2023 Echo: mild-mod AS.   Arthritis    Chronic abdominal pain    Chronic HFrEF (heart failure with reduced ejection fraction) (HCC)    a. EF at 30-35% by echo in 08/2018 b. EF at 35% by repeat echo in 05/2019 c. EF improved to 65-70% in 2021 d. EF at 40-45% in 08/2021; e. 02/2023 Echo: 40-45%, glob HK, mod LVH, nl RV size/fx, sev BAE, mild-mod AS.   Cocaine abuse (HCC)    COPD (chronic obstructive pulmonary disease) (HCC)    Diabetes mellitus without complication (HCC)    Essential hypertension, benign    Expressive aphasia    post CVA   Fatty liver    GERD (gastroesophageal reflux disease)    Gout 2016   Normal coronary arteries    a. 10/2008 abnl MV; b. 10/2008 Cath: nl cors.   Ovarian cyst    Permanent atrial fibrillation (HCC)    Stroke (HCC) 12/26/2019   Right sided weakness, and expressive aphasia   Thoracic  ascending aortic aneurysm (HCC)    a. 4 cm 10/31/19 CTA; b. 02/2023 Echo: Nl Ao root.   Type 2 diabetes mellitus (HCC)    type II    PAST SURGICAL HISTORY: Past Surgical History:  Procedure Laterality Date   ABDOMINAL HYSTERECTOMY  09/10/2011   Procedure: HYSTERECTOMY ABDOMINAL;  Surgeon: Albino Hum, MD;  Location: [facility];  Service: Gynecology;  Laterality: N/A;  Abdominal hysterectomy   CESAREAN SECTION  1610,9604, and 1994   CHOLECYSTECTOMY  1995   IR 3D INDEPENDENT WKST  03/16/2020  IR ANGIO INTRA EXTRACRAN SEL INTERNAL CAROTID BILAT MOD SED  03/16/2020   IR ANGIO VERTEBRAL SEL SUBCLAVIAN INNOMINATE UNI L MOD SED  03/16/2020   IR ANGIO VERTEBRAL SEL VERTEBRAL UNI R MOD SED  03/16/2020   IR CT HEAD LTD  12/27/2019   IR PERCUTANEOUS ART THROMBECTOMY/INFUSION INTRACRANIAL INC DIAG ANGIO  12/27/2019   IR US  GUIDE VASC ACCESS RIGHT  12/27/2019   IR US  GUIDE VASC ACCESS RIGHT  03/16/2020   RADIOLOGY WITH ANESTHESIA N/A 12/27/2019   Procedure: IR WITH ANESTHESIA;  Surgeon: Luellen Sages, MD;  Location: [facility];  Service: Radiology;  Laterality: N/A;   SCAR REVISION  09/10/2011   Procedure: SCAR REVISION;  Surgeon: Albino Hum, MD;  Location: [facility];  Service: Gynecology;  Laterality: N/A;  Wide Excision of old Cicatrix   TUBAL LIGATION  1994    FAMILY HISTORY: Family History  Problem Relation Age of Onset   Cirrhosis Mother    Early death Mother    Alcohol  abuse Mother    Diabetes type II Father    Alcohol  abuse Father    Diabetes type II Sister    Early death Brother    Alcohol  abuse Son    Anesthesia problems Neg Hx    Hypotension Neg Hx    Malignant hyperthermia Neg Hx    Pseudochol deficiency Neg Hx    Colon cancer Neg Hx    Colon polyps Neg Hx    Esophageal cancer Neg Hx    Rectal cancer Neg Hx    Stomach cancer Neg Hx     SOCIAL HISTORY: Social History   Socioeconomic History   Marital status: Single    Spouse name: Not on file   Number of children: Not on file    Years of education: Not on file   Highest education level: Not on file  Occupational History   Not on file  Tobacco Use   Smoking status: Former    Current packs/day: 0.00    Types: Cigarettes    Start date: 12/1990    Quit date: 12/2019    Years since quitting: 3.9   Smokeless tobacco: Never  Vaping Use   Vaping status: Never Used  Substance and Sexual Activity   Alcohol  use: Not Currently    Comment: occ   Drug use: Not Currently   Sexual activity: Not Currently    Birth control/protection: Surgical  Other Topics Concern   Not on file  Social History Narrative   ** Merged History Encounter **       Part-time at [employer], lives in [place], [name] 3 children  Married   Social Drivers of Corporate investment banker Strain: Not on file  Food Insecurity: No Food Insecurity (09/30/2023)   Hunger Vital Sign    Worried About Running Out of Food in the Last Year: Never true    Ran Out of Food in the Last Year: Never true  Transportation Needs: No Transportation Needs (09/30/2023)   PRAPARE - Administrator, Civil Service (Medical): No    [name] of Transportation (Non-Medical): No  Physical Activity: Not on file  Stress: Not on file  Social Connections: Not on file  Intimate Partner Violence: Patient Unable To Answer (09/30/2023)   Humiliation, Afraid, Rape, and Kick questionnaire    Fear of Current or Ex-Partner: Patient unable to answer    Emotionally Abused: Patient unable to answer    Physically Abused: Patient unable to answer  Sexually Abused: Patient unable to answer    PHYSICAL EXAM  GENERAL EXAM/CONSTITUTIONAL: Vitals:  Vitals:   12/02/23 1305  BP: 137/82  Pulse: 70  Weight: 233 lb (105.7 kg)  Height: 5\' 6"  (1.676 m)   Body mass index is 37.61 kg/m. Wt Readings from Last 3 Encounters:  12/02/23 233 lb (105.7 kg)  10/09/23 235 lb 12.8 oz (107 kg)  10/06/23 235 lb (106.6 kg)   Patient is in no distress; well developed, nourished and  groomed; neck is supple  MUSCULOSKELETAL: Gait, strength, tone, movements noted in Neurologic exam below  NEUROLOGIC: MENTAL STATUS:      No data to display         awake, alert, she is aphasic, trouble naming and repeating. She also has difficulty following commands.   CRANIAL NERVE:  2nd, 3rd, 4th, 6th - Visual fields full to confrontation, extraocular muscles intact, no nystagmus 5th - facial sensation symmetric 7th - facial strength symmetric 8th - hearing intact 9th - palate elevates symmetrically, uvula midline 11th - shoulder shrug symmetric 12th - tongue protrusion midline  MOTOR:  normal bulk and tone, mild right upper nad lower extremity weakness.   COORDINATION:  Normal finger-nose-finger with lots of coaching   GAIT/STATION:  Uses a cane     DIAGNOSTIC DATA (LABS, IMAGING, TESTING) - I reviewed patient records, labs, notes, testing and imaging myself where available.  Lab Results  Component Value Date   WBC 6.0 10/09/2023   HGB 13.8 10/09/2023   HCT 43.3 10/09/2023   MCV 85.7 10/09/2023   PLT 294.0 10/09/2023      Component Value Date/Time   NA 138 10/09/2023 1148   NA 133 (A) 10/16/2020 0000   K 4.1 10/09/2023 1148   CL 103 10/09/2023 1148   CO2 27 10/09/2023 1148   GLUCOSE 94 10/09/2023 1148   BUN 21 10/09/2023 1148   BUN 27 (A) 10/16/2020 0000   CREATININE 1.86 (H) 10/09/2023 1148   CREATININE 1.88 (H) 12/02/2022 0847   CALCIUM  9.6 10/09/2023 1148   PROT 7.4 10/09/2023 1148   ALBUMIN 4.0 10/09/2023 1148   AST 15 10/09/2023 1148   ALT 11 10/09/2023 1148   ALKPHOS 91 10/09/2023 1148   BILITOT 0.4 10/09/2023 1148   GFRNONAA 48 (L) 09/28/2023 0652   GFRAA 39.12 05/22/2020 0000   Lab Results  Component Value Date   CHOL 117 12/02/2022   HDL 38 (L) 12/02/2022   LDLCALC 61 12/02/2022   TRIG 97 12/02/2022   Lab Results  Component Value Date   HGBA1C 7.9 (A) 10/06/2023   Lab Results  Component Value Date   VITAMINB12 285  09/24/2023   Lab Results  Component Value Date   TSH 0.541 09/24/2023    MRI Brain 09/07/2023 1. No acute intracranial abnormality. 2. Large chronic left MCA infarct. 3. Hypoplastic distal left vertebral artery with absent flow void suggesting slow flow or occlusion.   Routine EEG 09/08/2023 -Continuous slow, left hemisphere, maximal left frontotemporal region -Intermittent slow, right hemisphere   I personally reviewed brain Images and previous EEG reports.   ASSESSMENT AND PLAN  58 y.o. year old female  with history of hypertension, hyperlipidemia, diabetes mellitus, left MCA stroke with residual right-sided weakness and aphasia who is presenting after being admitted to the hospital for seizure.  Since discharge from the hospital, she has not had any additional events, she is compliant with the Keppra  250 mg twice daily but does have side effect of headaches.  Plan  will be to switch Keppra  to oxcarbazepine, 300 mg twice daily.  Patient will start 150 mg twice daily for 2 weeks and if no side effect, will increase to 300 mg twice daily.  We discussed potential side effect including dizziness, and hyponatremia. She is set up to get to have labs done next week.  Advised them to contact me if she does have another seizure, otherwise I will see her in 6 months for follow-up or sooner if worse.   1. Seizures (HCC)   2. Chronic ischemic left MCA stroke   3. Right hemiparesis (HCC)   4. Aphasia     Patient Instructions  Discontinue levetiracetam  due to side effect of constant headache Start oxcarbazepine, 300 mg, half tablet twice daily for 2 weeks then increase to full tablet twice daily Continue your other medications Please contact me if you have another seizure Follow-up in 6 months or sooner if worse.   Per [place]  DMV statutes, patients with seizures are not allowed to drive until they have been seizure-free for six months.  Other recommendations include using caution when  using heavy equipment or power tools. Avoid working on ladders or at heights. Take showers instead of baths.  Do not swim alone.  Ensure the water  temperature is not too high on the home water  heater. Do not go swimming alone. Do not lock yourself in a room alone (i.e. bathroom). When caring for infants or small children, sit down when holding, feeding, or changing them to minimize risk of injury to the child in the event you have a seizure. Maintain good sleep hygiene. Avoid alcohol .  Also recommend adequate sleep, hydration, good diet and minimize stress.   During the Seizure  - First, ensure adequate ventilation and place patients on the floor on their left side  Loosen clothing around the neck and ensure the airway is patent. If the patient is clenching the teeth, do not force the mouth open with any object as this can cause severe damage - Remove all items from the surrounding that can be hazardous. The patient may be oblivious to what's happening and may not even know what he or she is doing. If the patient is confused and wandering, either gently guide him/her away and block access to outside areas - Reassure the individual and be comforting - Call 911. In most cases, the seizure ends before EMS arrives. However, there are cases when seizures may last over 3 to 5 minutes. Or the individual may have developed breathing difficulties or severe injuries. If a pregnant patient or a Mellor with diabetes develops a seizure, it is prudent to call an ambulance. - Finally, if the patient does not regain full consciousness, then call EMS. Most patients will remain confused for about 45 to 90 minutes after a seizure, so you must use judgment in calling for help. - Avoid restraints but make sure the patient is in a bed with padded side rails - Place the individual in a lateral position with the neck slightly flexed; this will help the saliva drain from the mouth and prevent the tongue from falling backward -  Remove all nearby furniture and other hazards from the area - Provide verbal assurance as the individual is regaining consciousness - Provide the patient with privacy if possible - Call for help and start treatment as ordered by the caregiver   After the Seizure (Postictal Stage)  After a seizure, most patients experience confusion, fatigue, muscle pain and/or a headache. Thus, one  should permit the individual to sleep. For the next few days, reassurance is essential. Being calm and helping reorient the [place] is also of importance.  Most seizures are painless and end spontaneously. Seizures are not harmful to others but can lead to complications such as stress on the lungs, brain and the heart. Individuals with prior lung problems may develop labored breathing and respiratory distress.    Discussed Patients with epilepsy have a small risk of sudden unexpected death, a condition referred to as sudden unexpected death in epilepsy (SUDEP). SUDEP is defined specifically as the sudden, unexpected, witnessed or unwitnessed, nontraumatic and nondrowning death in patients with epilepsy with or without evidence for a seizure, and excluding documented status epilepticus, in which post mortem examination does not reveal a structural or toxicologic cause for death     No orders of the defined types were placed in this encounter.   Meds ordered this encounter  Medications   Oxcarbazepine (TRILEPTAL) 300 MG tablet    Sig: Take 1 tablet (300 mg total) by mouth 2 (two) times daily.    Dispense:  60 tablet    Refill:  6    Return in about 6 months (around 06/02/2024).    Cassandra Cleveland, MD 12/02/2023, 1:55 PM  [facility] [address], [address] [place], [name] [address] [phone]

## 2023-12-05 ENCOUNTER — Other Ambulatory Visit: Payer: Self-pay | Admitting: Physician Assistant

## 2023-12-10 ENCOUNTER — Ambulatory Visit

## 2023-12-10 VITALS — Ht 66.0 in | Wt 233.0 lb

## 2023-12-10 DIAGNOSIS — Z0000 Encounter for general adult medical examination without abnormal findings: Secondary | ICD-10-CM | POA: Diagnosis not present

## 2023-12-10 NOTE — Patient Instructions (Signed)
 Robin [name] , Thank you for taking time to come for your Medicare Wellness Visit. I appreciate your ongoing commitment to your health goals. Please review the following plan we discussed and let me know if I can assist you in the future.   Referrals/Orders/Follow-Ups/Clinician Recommendations: continue to work on speech improvement and maintain health and activity   This is a list of the screening recommended for you and due dates:  Health Maintenance  Topic Date Due   Medicare Annual Wellness Visit  Never done   Hepatitis C Screening  Never done   Zoster (Shingles) Vaccine (1 of 2) Never done   Colon Cancer Screening  Never done   Mammogram  07/27/2016   COVID-19 Vaccine (3 - Moderna risk series) 12/13/2019   Eye exam for diabetics  08/07/2022   DTaP/Tdap/Td vaccine (2 - Td or Tdap) 06/17/2023   Complete foot exam   10/30/2023   Flu Shot  03/05/2024   Hemoglobin A1C  04/07/2024   Screening for Lung Cancer  09/22/2024   Yearly kidney function blood test for diabetes  10/08/2024   Yearly kidney health urinalysis for diabetes  10/08/2024   Pneumococcal Vaccination  Completed   HIV Screening  Completed   HPV Vaccine  Aged Out   Meningitis B Vaccine  Aged Out    Advanced directives: (Declined) Advance directive discussed with you today. Even though you declined this today, please call our office should you change your mind, and we can give you the proper paperwork for you to fill out.  Next Medicare Annual Wellness Visit scheduled for next year: Yes  Have you seen your provider in the last 6 months (3 months if uncontrolled diabetes)? Yes 10/09/23

## 2023-12-10 NOTE — Progress Notes (Signed)
 Subjective:   Robin [name] is a 58 y.o. who presents for a Medicare Wellness preventive visit.  Visit Complete: Virtual I connected with  Robin [name] on 12/10/23 by a video and audio enabled telemedicine application and verified that I am speaking with the correct [name] using two identifiers.  Patient Location: Home  Provider Location: Home Office  I discussed the limitations of evaluation and management by telemedicine. The patient expressed understanding and agreed to proceed.  Vital Signs: Because this visit was a virtual/telehealth visit, some criteria may be missing or patient reported. Any vitals not documented were not able to be obtained and vitals that have been documented are patient reported.    Persons Participating in Visit: Patient assisted by [name].  AWV Questionnaire: No: Patient Medicare AWV questionnaire was not completed prior to this visit.  Cardiac Risk Factors include: advanced age (>50men, >68 women);hypertension;diabetes mellitus;dyslipidemia;obesity (BMI >30kg/m2)     Objective:    Today's Vitals   12/10/23 1257  Weight: 233 lb (105.7 kg)  Height: 5\' 6"  (1.676 m)   Body mass index is 37.61 kg/m.     12/10/2023    1:14 PM 09/24/2023    6:28 PM 09/07/2023    4:55 PM 04/08/2023    1:51 PM 04/08/2023    1:44 PM 04/08/2023    1:38 PM 03/19/2023   11:23 AM  Advanced Directives  Does Patient Have a Medical Advance Directive? No Yes No Unable to assess, patient is non-responsive or altered mental status No No No  Type of Advance Directive  Healthcare Power of Attorney       Does patient want to make changes to medical advance directive?  No - Patient declined       Copy of Healthcare Power of Attorney in Chart?  No - copy requested       Would patient like information on creating a medical advance directive? No - Patient declined No - Patient declined No - Patient declined  No - Patient declined No - Patient declined     Current Medications  (verified) Outpatient Encounter Medications as of 12/10/2023  Medication Sig   Accu-Chek Softclix Lancets lancets Use as instructed   apixaban  (ELIQUIS ) 5 MG TABS tablet Take 1 tablet by mouth twice daily   aspirin  EC 81 MG tablet Take 1 tablet (81 mg total) by mouth daily. Swallow whole.   Blood Glucose Monitoring Suppl (ACCU-CHEK GUIDE ME) w/Device KIT 1 Device by Does not apply route 3 (three) times daily.   carvedilol  (COREG ) 25 MG tablet TAKE 2 TABLETS BY MOUTH TWICE DAILY WITH A MEAL   Dulaglutide  (TRULICITY ) 4.5 MG/0.5ML SOAJ Inject 4.5 mg into the skin daily in the afternoon. (Patient taking differently: Inject 4.5 mg into the skin once a week.)   empagliflozin  (JARDIANCE ) 25 MG TABS tablet Take 1 tablet (25 mg total) by mouth daily before breakfast.   ferrous sulfate  (FEROSUL) 325 (65 FE) MG tablet Take 1 tablet by mouth once daily   furosemide  (LASIX ) 20 MG tablet TAKE 1 TABLET BY MOUTH ONCE DAILY (MAY TAKE ADDITIONAL 20 MG IF WEIGHT GAIN IS OVER 2LBS OVERNIGHT OR 5 LBS IN A WEEK)   gabapentin  (NEURONTIN ) 100 MG capsule TAKE 2 CAPSULES BY MOUTH THREE TIMES DAILY   glucose blood (ACCU-CHEK GUIDE) test strip USE 1 STRIP TO CHECK GLUCOSE THREE TIMES DAILY   hydrALAZINE  (APRESOLINE ) 25 MG tablet TAKE 1 TABLET BY MOUTH THREE TIMES DAILY   insulin  aspart (NOVOLOG  FLEXPEN) 100 UNIT/ML  FlexPen Inject 12 Units into the skin 3 (three) times daily with meals.   insulin  glargine (LANTUS  SOLOSTAR) 100 UNIT/ML Solostar Pen Inject 44 Units into the skin daily. (Patient taking differently: Inject 44 Units into the skin at bedtime.)   Insulin  Pen Needle (BD PEN NEEDLE NANO U/F) 32G X 4 MM MISC Inject 1 Device into the skin in the morning, at noon, in the evening, and at bedtime.   isosorbide  mononitrate (IMDUR ) 30 MG 24 hr tablet Take 2 tablets by mouth once daily   Oxcarbazepine  (TRILEPTAL ) 300 MG tablet Take 1 tablet (300 mg total) by mouth 2 (two) times daily.   pantoprazole  (PROTONIX ) 40 MG tablet  Take 1 tablet (40 mg total) by mouth daily.   timolol  (TIMOPTIC ) 0.5 % ophthalmic solution Place 1 drop into both eyes 2 (two) times daily.   tobramycin  (TOBREX ) 0.3 % ophthalmic solution Place 1 drop into both eyes 4 (four) times daily.   [DISCONTINUED] dicyclomine  (BENTYL ) 20 MG tablet Take 1 tablet by mouth 3 (three) times daily.   No facility-administered encounter medications on file as of 12/10/2023.    Allergies (verified) Bee venom, Losartan , Naproxen , Lisinopril , and Penicillins   History: Past Medical History:  Diagnosis Date   Allergy    Anemia    Aortic stenosis    a. 02/2023 Echo: mild-mod AS.   Arthritis    Chronic abdominal pain    Chronic HFrEF (heart failure with reduced ejection fraction) (HCC)    a. EF at 30-35% by echo in 08/2018 b. EF at 35% by repeat echo in 05/2019 c. EF improved to 65-70% in 2021 d. EF at 40-45% in 08/2021; e. 02/2023 Echo: 40-45%, glob HK, mod LVH, nl RV size/fx, sev BAE, mild-mod AS.   Cocaine abuse (HCC)    COPD (chronic obstructive pulmonary disease) (HCC)    Diabetes mellitus without complication (HCC)    Essential hypertension, benign    Expressive aphasia    post CVA   Fatty liver    GERD (gastroesophageal reflux disease)    Gout 2016   Normal coronary arteries    a. 10/2008 abnl MV; b. 10/2008 Cath: nl cors.   Ovarian cyst    Permanent atrial fibrillation (HCC)    Stroke (HCC) 12/26/2019   Right sided weakness, and expressive aphasia   Thoracic ascending aortic aneurysm (HCC)    a. 4 cm 10/31/19 CTA; b. 02/2023 Echo: Nl Ao root.   Type 2 diabetes mellitus (HCC)    type II   Past Surgical History:  Procedure Laterality Date   ABDOMINAL HYSTERECTOMY  09/10/2011   Procedure: HYSTERECTOMY ABDOMINAL;  Surgeon: Albino Hum, MD;  Location: [facility];  Service: Gynecology;  Laterality: N/A;  Abdominal hysterectomy   CESAREAN SECTION  [id number], and 1994   CHOLECYSTECTOMY  1995   IR 3D INDEPENDENT WKST  03/16/2020   IR ANGIO INTRA  EXTRACRAN SEL INTERNAL CAROTID BILAT MOD SED  03/16/2020   IR ANGIO VERTEBRAL SEL SUBCLAVIAN INNOMINATE UNI L MOD SED  03/16/2020   IR ANGIO VERTEBRAL SEL VERTEBRAL UNI R MOD SED  03/16/2020   IR CT HEAD LTD  12/27/2019   IR PERCUTANEOUS ART THROMBECTOMY/INFUSION INTRACRANIAL INC DIAG ANGIO  12/27/2019   IR US  GUIDE VASC ACCESS RIGHT  12/27/2019   IR US  GUIDE VASC ACCESS RIGHT  03/16/2020   RADIOLOGY WITH ANESTHESIA N/A 12/27/2019   Procedure: IR WITH ANESTHESIA;  Surgeon: Luellen Sages, MD;  Location: [facility];  Service: Radiology;  Laterality: N/A;  SCAR REVISION  09/10/2011   Procedure: SCAR REVISION;  Surgeon: Albino Hum, MD;  Location: [facility];  Service: Gynecology;  Laterality: N/A;  Wide Excision of old Cicatrix   TUBAL LIGATION  1994   Family History  Problem Relation Age of Onset   Cirrhosis Mother    Early death Mother    Alcohol  abuse Mother    Diabetes type II Father    Alcohol  abuse Father    Diabetes type II Sister    Early death Brother    Alcohol  abuse Son    Anesthesia problems Neg Hx    Hypotension Neg Hx    Malignant hyperthermia Neg Hx    Pseudochol deficiency Neg Hx    Colon cancer Neg Hx    Colon polyps Neg Hx    Esophageal cancer Neg Hx    Rectal cancer Neg Hx    Stomach cancer Neg Hx    Social History   Socioeconomic History   Marital status: Single    Spouse name: Not on file   Number of children: Not on file   Years of education: Not on file   Highest education level: Not on file  Occupational History   Not on file  Tobacco Use   Smoking status: Former    Current packs/day: 0.00    Types: Cigarettes    Start date: 12/1990    Quit date: 12/2019    Years since quitting: 4.0   Smokeless tobacco: Never  Vaping Use   Vaping status: Never Used  Substance and Sexual Activity   Alcohol  use: Not Currently    Comment: occ   Drug use: Not Currently   Sexual activity: Not Currently    Birth control/protection: Surgical  Other Topics Concern   Not  on file  Social History Narrative   ** Merged History Encounter **       Part-time at [employer], lives in [place], [name] 3 children  Married   Social Drivers of Corporate investment banker Strain: Low Risk  (12/10/2023)   Overall Financial Resource Strain (CARDIA)    Difficulty of Paying Living Expenses: Not hard at all  Food Insecurity: No Food Insecurity (12/10/2023)   Hunger Vital Sign    Worried About Running Out of Food in the Last Year: Never true    Ran Out of Food in the Last Year: Never true  Transportation Needs: No Transportation Needs (12/10/2023)   PRAPARE - Administrator, Civil Service (Medical): No    Lack of Transportation (Non-Medical): No  Physical Activity: Sufficiently Active (12/10/2023)   Exercise Vital Sign    Days of Exercise per Week: 3 days    Minutes of Exercise per Session: 60 min  Stress: No Stress Concern Present (12/10/2023)   [employer] of Occupational Health - Occupational Stress Questionnaire    Feeling of Stress : Not at all  Social Connections: Moderately Isolated (12/10/2023)   Social Connection and Isolation Panel [NHANES]    Frequency of Communication with Friends and Family: Three times a week    Frequency of Social Gatherings with Friends and Family: Three times a week    Attends Religious Services: 1 to 4 times per year    Active Member of Clubs or Organizations: No    Attends Banker Meetings: Never    Marital Status: Never married    Tobacco Counseling Counseling given: Not Answered    Clinical Intake:  Pre-visit preparation completed: Yes  Pain :  No/denies pain     BMI - recorded: 37.61 Nutritional Status: BMI > 30  Obese Diabetes: Yes CBG done?: Yes (130 per pt) CBG resulted in Enter/ Edit results?: No Did pt. bring in CBG monitor from home?: No  Lab Results  Component Value Date   HGBA1C 7.9 (A) 10/06/2023   HGBA1C 8.8 (A) 05/09/2023   HGBA1C 8.2 (H) 12/02/2022     How often do you  need to have someone help you when you read instructions, pamphlets, or other written materials from your doctor or pharmacy?: 1 - Never  Interpreter Needed?: No  Information entered by :: Lamont Pilsner, LPN   Activities of Daily Living     12/10/2023    1:01 PM 09/24/2023    6:28 PM  In your present state of health, do you have any difficulty performing the following activities:  Hearing? 0 0  Vision? 0 0  Difficulty concentrating or making decisions? 0 0  Walking or climbing stairs? 1   Comment uses walker and cane   Dressing or bathing? 1   Comment assistance   Doing errands, shopping? 0 0  Preparing Food and eating ? Y   Comment assistance   Using the Toilet? N   In the past six months, have you accidently leaked urine? N   Do you have problems with loss of bowel control? N   Managing your Medications? N   Managing your Finances? N   Housekeeping or managing your Housekeeping? N     Patient Care Team: Alexander Iba, [place] as PCP - General (Physician Assistant) Amanda Jungling, Joyceann No, MD as PCP - Cardiology (Cardiology) Lisabeth Rider, MD as Consulting Physician (Neurology) [facility], Julian Obey, MD as Consulting Physician (Endocrinology) Alexander Iba, [place] (Physician Assistant)  Indicate any recent Medical Services you may have received from other than [facility] providers in the past year (date may be approximate).     Assessment:   This is a routine wellness examination for [place].  Hearing/Vision screen Hearing Screening - Comments:: Pt denies any hearing issues  Vision Screening - Comments:: Wears rx glasses - up to date with routine eye exams with Dr Candi Chafe     Goals Addressed             This Visit's Progress    Patient Stated       Maintain health and activity and improve speech       Depression Screen     12/10/2023    1:11 PM 02/05/2023   11:07 AM 08/14/2022    9:30 AM 02/14/2020   11:27 AM 09/30/2019    9:17 AM 09/01/2018    7:52 AM  PHQ 2/9  Scores  PHQ - 2 Score 0 0 0 4 0 0  PHQ- 9 Score  0  10      Fall Risk     12/10/2023    1:15 PM 02/14/2020   11:26 AM 09/30/2019    9:17 AM  Fall Risk   Falls in the past year? 0 1 0  Comment  Fell off bed 02/13/2020   Number falls in past yr: 0 1   Injury with Fall? 0 0   Risk for fall due to : No Fall Risks;Impaired balance/gait;Impaired mobility    Follow up Falls prevention discussed      MEDICARE RISK AT HOME:  Medicare Risk at Home Any stairs in or around the home?: No If so, are there any without handrails?: No Home free of loose throw  rugs in walkways, pet beds, electrical cords, etc?: Yes Adequate lighting in your home to reduce risk of falls?: Yes Life alert?: Yes Use of a cane, walker or w/c?: Yes Grab bars in the bathroom?: Yes Shower chair or bench in shower?: Yes Elevated toilet seat or a handicapped toilet?: Yes  TIMED UP AND GO:  Was the test performed?  No  Cognitive Function: 6CIT completed with difficulty reciting months due to dx         12/10/2023    1:17 PM  6CIT Screen  What Year? 0 points  What month? 0 points  What time? 0 points  Count back from 20 0 points  Months in reverse 0 points  Repeat phrase --    Immunizations Immunization History  Administered Date(s) Administered   Influenza,inj,Quad PF,6+ Mos 07/12/2016, 09/01/2018, 07/05/2020, 06/13/2021   Moderna Sars-Covid-2 Vaccination 10/15/2019, 11/15/2019   PNEUMOCOCCAL CONJUGATE-20 10/09/2023   PPD Test 09/10/2023   Pneumococcal Polysaccharide-23 07/12/2016, 01/09/2020   Tdap 06/16/2013    Screening Tests Health Maintenance  Topic Date Due   Hepatitis C Screening  Never done   Zoster Vaccines- Shingrix (1 of 2) Never done   Colonoscopy  Never done   MAMMOGRAM  07/27/2016   COVID-19 Vaccine (3 - Moderna risk series) 12/13/2019   OPHTHALMOLOGY EXAM  08/07/2022   DTaP/Tdap/Td (2 - Td or Tdap) 06/17/2023   FOOT EXAM  10/30/2023   INFLUENZA VACCINE  03/05/2024   HEMOGLOBIN A1C   04/07/2024   Lung Cancer Screening  09/22/2024   Diabetic kidney evaluation - eGFR measurement  10/08/2024   Diabetic kidney evaluation - Urine ACR  10/08/2024   Medicare Annual Wellness (AWV)  12/09/2024   Pneumococcal Vaccine 25-29 Years old  Completed   HIV Screening  Completed   HPV VACCINES  Aged Out   Meningococcal B Vaccine  Aged Out    Health Maintenance  Health Maintenance Due  Topic Date Due   Hepatitis C Screening  Never done   Zoster Vaccines- Shingrix (1 of 2) Never done   Colonoscopy  Never done   MAMMOGRAM  07/27/2016   COVID-19 Vaccine (3 - Moderna risk series) 12/13/2019   OPHTHALMOLOGY EXAM  08/07/2022   DTaP/Tdap/Td (2 - Td or Tdap) 06/17/2023   FOOT EXAM  10/30/2023   Health Maintenance Items Addressed: See Nurse Notes  Additional Screening:  Vision Screening: Recommended annual ophthalmology exams for early detection of glaucoma and other disorders of the eye.  Dental Screening: Recommended annual dental exams for proper oral hygiene  Community Resource Referral / Chronic Care Management: CRR required this visit?  No   CCM required this visit?  No     Plan:     I have personally reviewed and noted the following in the patient's chart:   Medical and social history Use of alcohol , tobacco or illicit drugs  Current medications and supplements including opioid prescriptions. Patient is not currently taking opioid prescriptions. Functional ability and status Nutritional status Physical activity Advanced directives List of other physicians Hospitalizations, surgeries, and ER visits in previous 12 months Vitals Screenings to include cognitive, depression, and falls Referrals and appointments  In addition, I have reviewed and discussed with patient certain preventive protocols, quality metrics, and best practice recommendations. A written personalized care plan for preventive services as well as general preventive health recommendations were  provided to patient.     Bruno Capri, LPN   08/10/1094   After Visit Summary: (MyChart) Due to this being a  telephonic visit, the after visit summary with patients personalized plan was offered to patient via MyChart   Notes: Nothing significant to report at this time.

## 2023-12-12 ENCOUNTER — Other Ambulatory Visit: Payer: Self-pay | Admitting: Internal Medicine

## 2023-12-12 DIAGNOSIS — Z794 Long term (current) use of insulin: Secondary | ICD-10-CM

## 2023-12-21 ENCOUNTER — Other Ambulatory Visit: Payer: Self-pay

## 2023-12-21 ENCOUNTER — Encounter (HOSPITAL_COMMUNITY): Payer: Self-pay

## 2023-12-21 ENCOUNTER — Emergency Department (HOSPITAL_COMMUNITY)

## 2023-12-21 ENCOUNTER — Emergency Department (HOSPITAL_COMMUNITY)
Admission: EM | Admit: 2023-12-21 | Discharge: 2023-12-21 | Disposition: A | Discharge status: Home / Self Care | Attending: Emergency Medicine | Admitting: Emergency Medicine

## 2023-12-21 DIAGNOSIS — Z7982 Long term (current) use of aspirin: Secondary | ICD-10-CM | POA: Diagnosis not present

## 2023-12-21 DIAGNOSIS — Z7901 Long term (current) use of anticoagulants: Secondary | ICD-10-CM | POA: Diagnosis not present

## 2023-12-21 DIAGNOSIS — S99911A Unspecified injury of right ankle, initial encounter: Secondary | ICD-10-CM | POA: Diagnosis present

## 2023-12-21 DIAGNOSIS — J449 Chronic obstructive pulmonary disease, unspecified: Secondary | ICD-10-CM | POA: Insufficient documentation

## 2023-12-21 DIAGNOSIS — M7989 Other specified soft tissue disorders: Secondary | ICD-10-CM | POA: Diagnosis not present

## 2023-12-21 DIAGNOSIS — I509 Heart failure, unspecified: Secondary | ICD-10-CM | POA: Insufficient documentation

## 2023-12-21 DIAGNOSIS — Z794 Long term (current) use of insulin: Secondary | ICD-10-CM | POA: Diagnosis not present

## 2023-12-21 DIAGNOSIS — S93401A Sprain of unspecified ligament of right ankle, initial encounter: Secondary | ICD-10-CM | POA: Insufficient documentation

## 2023-12-21 DIAGNOSIS — Z7984 Long term (current) use of oral hypoglycemic drugs: Secondary | ICD-10-CM | POA: Insufficient documentation

## 2023-12-21 DIAGNOSIS — I110 Hypertensive heart disease with heart failure: Secondary | ICD-10-CM | POA: Insufficient documentation

## 2023-12-21 DIAGNOSIS — X501XXA Overexertion from prolonged static or awkward postures, initial encounter: Secondary | ICD-10-CM | POA: Diagnosis not present

## 2023-12-21 DIAGNOSIS — E119 Type 2 diabetes mellitus without complications: Secondary | ICD-10-CM | POA: Insufficient documentation

## 2023-12-21 DIAGNOSIS — Y9301 Activity, walking, marching and hiking: Secondary | ICD-10-CM | POA: Insufficient documentation

## 2023-12-21 DIAGNOSIS — Z79899 Other long term (current) drug therapy: Secondary | ICD-10-CM | POA: Insufficient documentation

## 2023-12-21 MED ORDER — ACETAMINOPHEN 325 MG PO TABS
650.0000 mg | ORAL_TABLET | Freq: Once | ORAL | Status: AC
  Administered 2023-12-21: 650 mg via ORAL
  Filled 2023-12-21: qty 2

## 2023-12-21 NOTE — ED Provider Notes (Signed)
 [place] EMERGENCY DEPARTMENT AT [facility] Provider Note   CSN: [id number] Arrival date & time: 12/21/23  1936     History  Chief Complaint  Patient presents with   Ankle Pain    Robin [name] is a 58 y.o. female.  Patient with history of CHF, COPD, diabetes, hypertension, stroke presents today with complaints of ankle injury.  She states that same occurred earlier today when she was walking on the stairs and twisted her right ankle.  She did not fall or injure anything else.  She has been able to walk since with some discomfort.  Denies any other injuries or complaints.  The history is provided by the patient. No language interpreter was used.  Ankle Pain      Home Medications Prior to Admission medications   Medication Sig Start Date End Date Taking? Authorizing Provider  Accu-Chek Softclix Lancets lancets Use as instructed 06/13/21   Shamleffer, Julian Obey, MD  apixaban  (ELIQUIS ) 5 MG TABS tablet Take 1 tablet by mouth twice daily 09/04/23   Alexander Iba, PA  aspirin  EC 81 MG tablet Take 1 tablet (81 mg total) by mouth daily. Swallow whole. 07/19/20   Strader, Dimple Francis, PA-C  Blood Glucose Monitoring Suppl (ACCU-CHEK GUIDE ME) w/Device KIT 1 Device by Does not apply route 3 (three) times daily. 03/09/21   Shamleffer, Ibtehal Jaralla, MD  carvedilol  (COREG ) 25 MG tablet TAKE 2 TABLETS BY MOUTH TWICE DAILY WITH A MEAL 09/18/23   Laurann Pollock, MD  Dulaglutide  (TRULICITY ) 4.5 MG/0.5ML SOAJ Inject 4.5 mg into the skin daily in the afternoon. Patient taking differently: Inject 4.5 mg into the skin once a week. 10/06/23   Shamleffer, Ibtehal Jaralla, MD  empagliflozin  (JARDIANCE ) 25 MG TABS tablet Take 1 tablet (25 mg total) by mouth daily before breakfast. 10/06/23   Shamleffer, Ibtehal Jaralla, MD  ferrous sulfate  (FEROSUL) 325 (65 FE) MG tablet Take 1 tablet by mouth once daily 07/17/23   Worley, Samantha, PA  furosemide  (LASIX ) 20 MG tablet TAKE 1 TABLET BY  MOUTH ONCE DAILY (MAY TAKE ADDITIONAL 20 MG IF WEIGHT GAIN IS OVER 2LBS OVERNIGHT OR 5 LBS IN A WEEK) 10/13/23   Laurann Pollock, MD  gabapentin  (NEURONTIN ) 100 MG capsule TAKE 2 CAPSULES BY MOUTH THREE TIMES DAILY 12/05/23   Alexander Iba, PA  glucose blood (ACCU-CHEK GUIDE TEST) test strip USE 1 STRIP TO CHECK GLUCOSE THREE TIMES DAILY 12/15/23   Shamleffer, Ibtehal Jaralla, MD  hydrALAZINE  (APRESOLINE ) 25 MG tablet TAKE 1 TABLET BY MOUTH THREE TIMES DAILY 09/23/23   Alexander Iba, PA  insulin  aspart (NOVOLOG  FLEXPEN) 100 UNIT/ML FlexPen Inject 12 Units into the skin 3 (three) times daily with meals. 10/06/23   Shamleffer, Ibtehal Jaralla, MD  insulin  glargine (LANTUS  SOLOSTAR) 100 UNIT/ML Solostar Pen Inject 44 Units into the skin daily. Patient taking differently: Inject 44 Units into the skin at bedtime. 10/06/23   Shamleffer, Ibtehal Jaralla, MD  Insulin  Pen Needle (BD PEN NEEDLE NANO U/F) 32G X 4 MM MISC Inject 1 Device into the skin in the morning, at noon, in the evening, and at bedtime. 10/06/23   Shamleffer, Ibtehal Jaralla, MD  isosorbide  mononitrate (IMDUR ) 30 MG 24 hr tablet Take 2 tablets by mouth once daily 09/23/23   Worley, Samantha, PA  Oxcarbazepine  (TRILEPTAL ) 300 MG tablet Take 1 tablet (300 mg total) by mouth 2 (two) times daily. 12/02/23   Camara, Amadou, MD  pantoprazole  (PROTONIX ) 40 MG tablet Take 1 tablet (40  mg total) by mouth daily. 10/20/23   Alexander Iba, PA  timolol  (TIMOPTIC ) 0.5 % ophthalmic solution Place 1 drop into both eyes 2 (two) times daily. 07/05/22   [provider]  tobramycin  (TOBREX ) 0.3 % ophthalmic solution Place 1 drop into both eyes 4 (four) times daily. 04/16/23   [provider]      Allergies    Bee venom, Losartan , Naproxen , Lisinopril , and Penicillins    Review of Systems   Review of Systems  Musculoskeletal:  Positive for arthralgias.  All other systems reviewed and are negative.   Physical Exam Updated Vital Signs BP  121/69 (BP Location: Left Arm)   Pulse 83   Temp (!) 97.5 F (36.4 C) (Oral)   Resp 20   Ht 5\' 6"  (1.676 m)   Wt 104.8 kg   LMP 08/21/2011   SpO2 96%   BMI 37.28 kg/m  Physical Exam Vitals and nursing note reviewed.  Constitutional:      General: She is not in acute distress.    Appearance: Normal appearance. She is normal weight. She is not ill-appearing, toxic-appearing or diaphoretic.  HENT:     Head: Normocephalic and atraumatic.  Cardiovascular:     Rate and Rhythm: Normal rate.  Pulmonary:     Effort: Pulmonary effort is normal. No respiratory distress.  Musculoskeletal:        General: Normal range of motion.     Cervical back: Normal range of motion.     Comments: TTP noted to the lateral aspect of the right ankle. ROM intact with discomfort. Mild swelling noted. DP and PT pulses intact and 2+. Compartments soft.  Skin:    General: Skin is warm and dry.  Neurological:     General: No focal deficit present.     Mental Status: She is alert.  Psychiatric:        Mood and Affect: Mood normal.        Behavior: Behavior normal.     ED Results / Procedures / Treatments   Labs (all labs ordered are listed, but only abnormal results are displayed) Labs Reviewed - No data to display  EKG None  Radiology DG Ankle Complete Right Result Date: 12/21/2023 CLINICAL DATA:  Right ankle pain after injury. Twisted ankle on stairs. EXAM: RIGHT ANKLE - COMPLETE 3+ VIEW COMPARISON:  None Available. FINDINGS: There is no evidence of fracture, dislocation, or joint effusion. The ankle mortise is preserved. Chronic dorsal talar spurring. Small plantar calcaneal spur. Soft tissues are unremarkable. IMPRESSION: 1. No fracture or subluxation of the right ankle. 2. Chronic dorsal talar spurring. Electronically Signed   By: Chadwick Colonel M.D.   On: 12/21/2023 20:34    Procedures Procedures    Medications Ordered in ED Medications  acetaminophen  (TYLENOL ) tablet 650 mg (has no  administration in time range)    ED Course/ Medical Decision Making/ A&P                                 Medical Decision Making Amount and/or Complexity of Data Reviewed Radiology: ordered.  Risk OTC drugs.   Patient presents today with complaints of right ankle injury earlier today.  She is afebrile, nontoxic-appearing, in no acute distress reassuring vital signs.  Physical exam reveals swelling noted to the lateral aspect of the right ankle along the ATFL.  Mild TTP.  ROM intact with pain.  X-ray imaging obtained by triage staff  prior to my evaluation and has resulted and reveals  1. No fracture or subluxation of the right ankle. 2. Chronic dorsal talar spurring.   I have personally reviewed and interpreted this imaging and agree with radiology interpretation.  Discussed results with patient who expressed understanding.  Also discussed with her that she likely has an ankle sprain.  I have given her an ankle brace to wear for support and recommend RICE with Tylenol  as needed for pain.  Will give referral to orthopedics for follow-up as well. Evaluation and diagnostic testing in the emergency department does not suggest an emergent condition requiring admission or immediate intervention beyond what has been performed at this time.  Plan for discharge with close PCP follow-up.  Patient is understanding and amenable with plan, educated on red flag symptoms that would prompt immediate return.  Patient discharged in stable condition.  Final Clinical Impression(s) / ED Diagnoses Final diagnoses:  Sprain of right ankle, unspecified ligament, initial encounter    Rx / DC Orders ED Discharge Orders     None     An After Visit Summary was printed and given to the patient.     Jacolyn Joaquin A, PA-C 12/21/23 2131    Jerilynn Montenegro, MD 12/24/23 1230

## 2023-12-21 NOTE — ED Notes (Signed)
 Robin [name] is with patient and will take her home. Pt reports she tried to reach her sister as well.

## 2023-12-21 NOTE — Discharge Instructions (Addendum)
 As we discussed, your workup in the ER today is reassuring for acute findings.  X-ray imaging of your ankle did not reveal any fracture or dislocation.  You do however likely have an ankle sprain.  I have given you a brace to wear for support of this and recommend that you rest, ice, compress, and elevate your foot and take Tylenol  as needed for pain.  I have given you a referral to an orthopedist with a number to call to schedule an appointment for follow-up as well.  Return if development of any new or worsening symptoms.

## 2023-12-21 NOTE — ED Triage Notes (Signed)
 Pt presents with R ankle injury after she twisted it wrong while on stairs.

## 2023-12-23 ENCOUNTER — Other Ambulatory Visit: Payer: Self-pay | Admitting: Physician Assistant

## 2023-12-23 DIAGNOSIS — I10 Essential (primary) hypertension: Secondary | ICD-10-CM

## 2024-01-08 ENCOUNTER — Encounter: Payer: Self-pay | Admitting: Internal Medicine

## 2024-01-08 DIAGNOSIS — E1165 Type 2 diabetes mellitus with hyperglycemia: Secondary | ICD-10-CM

## 2024-01-09 MED ORDER — INSULIN LISPRO (1 UNIT DIAL) 100 UNIT/ML (KWIKPEN): 12.0000 [IU] | PEN_INJECTOR | Freq: Three times a day (TID) | SUBCUTANEOUS | 11 refills | Status: DC

## 2024-01-09 NOTE — Telephone Encounter (Signed)
 Requested Prescriptions   Signed Prescriptions Disp Refills   insulin  lispro (HUMALOG  KWIKPEN) 100 UNIT/ML KwikPen 15 mL 11    Sig: Inject 12 Units into the skin 3 (three) times daily.    Authorizing Provider: Jorge Newcomer    Ordering User: Waneta Gut

## 2024-01-12 ENCOUNTER — Other Ambulatory Visit: Payer: Self-pay

## 2024-01-12 ENCOUNTER — Encounter: Payer: Self-pay | Admitting: Cardiology

## 2024-01-12 ENCOUNTER — Ambulatory Visit: Payer: 59 | Attending: Cardiology | Admitting: Cardiology

## 2024-01-12 VITALS — BP 126/78 | HR 82 | Ht 66.0 in | Wt 237.0 lb

## 2024-01-12 DIAGNOSIS — I350 Nonrheumatic aortic (valve) stenosis: Secondary | ICD-10-CM | POA: Diagnosis not present

## 2024-01-12 DIAGNOSIS — I10 Essential (primary) hypertension: Secondary | ICD-10-CM | POA: Diagnosis not present

## 2024-01-12 DIAGNOSIS — I4821 Permanent atrial fibrillation: Secondary | ICD-10-CM

## 2024-01-12 DIAGNOSIS — I5022 Chronic systolic (congestive) heart failure: Secondary | ICD-10-CM

## 2024-01-12 MED ORDER — FUROSEMIDE 20 MG PO TABS: ORAL_TABLET | ORAL | 1 refills | Status: DC

## 2024-01-12 NOTE — Patient Instructions (Signed)
 Medication Instructions:  Your physician recommends that you continue on your current medications as directed. Please refer to the Current Medication list given to you today.   Labwork: None today  Testing/Procedures: Your physician has requested that you have an echocardiogram. Echocardiography is a painless test that uses sound waves to create images of your heart. It provides your doctor with information about the size and shape of your heart and how well your heart's chambers and valves are working. This procedure takes approximately one hour. There are no restrictions for this procedure. Please do NOT wear cologne, perfume, aftershave, or lotions (deodorant is allowed). Please arrive 15 minutes prior to your appointment time.  Please note: We ask at that you not bring children with you during ultrasound (echo/ vascular) testing. Due to room size and safety concerns, children are not allowed in the ultrasound rooms during exams. Our front office staff cannot provide observation of children in our lobby area while testing is being conducted. An adult accompanying a patient to their appointment will only be allowed in the ultrasound room at the discretion of the ultrasound technician under special circumstances. We apologize for any inconvenience.   Follow-Up: 6 months  Any Other Special Instructions Will Be Listed Below (If Applicable).  If you need a refill on your cardiac medications before your next appointment, please call your pharmacy.

## 2024-01-12 NOTE — Progress Notes (Signed)
 Clinical Summary Robin [name] is a 57 y.o.female seen today for follow up of the following medical problems.      1.Chronic HFrEF - diagnosed during Jan 2020 admission - Jan 2020 echo LVEF 30-35%, mild LVH  - Jan 2020 admission cocaine +, severe HTN with medication noncompliance     05/2020 echo LVEF 70-75% 07/2020 echo LVEF 50-55% Jan 2023 echo LVEF 40-45% -11/2021 echo LVEF 25-30% (during admit with afib with RVR, though tachy mediated   02/2023 echo: LVEF 40-45%, severe BAE  has not been on an ACE-I/ARB/ARNI/MRA due to her variable renal function. 09/2022 GFR was 27 - no SOB/DOE, occasional LE edema. Has prn lasix  - compliant with meds   2.Long standing persistent Afib - diagnosed 04/2019 - has been rate controlled. Have not attempted rhythm control due to severe LAE   - some palpitations at times - compliant with meds - no bleeding on eliquis .     3. HTN - compliant with meds     4. CKD -followed by nephrology     5. History of CVA She is s/p MCA infarct in 12/2019 and required thrombectomy with TICI3 reperfusion. She is being followed by IR for possible right ICA aneurysm coiling/stenting in the future. IR did recommend we stop Brilinta  for now but continue ASA 81mg  daily with Eliquis .   11/2022 TC 117 TG 97 HLD 38 LDL 61  6. Aortic stenosis - 02/2023 echo: LVEF 40-45%, severe BAE, AVA VTI 1.05, mean grad 14 DI 0.28 SVI 26 - essentially mild to moderate, lower than expected gradient due to low SVI  7. DM2 - followed by pcp unclear history of statin, had been on crestor  and atorva in the past.   Past Medical History:  Diagnosis Date   Allergy    Anemia    Aortic stenosis    a. 02/2023 Echo: mild-mod AS.   Arthritis    Chronic abdominal pain    Chronic HFrEF (heart failure with reduced ejection fraction) (HCC)    a. EF at 30-35% by echo in 08/2018 b. EF at 35% by repeat echo in 05/2019 c. EF improved to 65-70% in 2021 d. EF at 40-45% in 08/2021; e.  02/2023 Echo: 40-45%, glob HK, mod LVH, nl RV size/fx, sev BAE, mild-mod AS.   Cocaine abuse (HCC)    COPD (chronic obstructive pulmonary disease) (HCC)    Diabetes mellitus without complication (HCC)    Essential hypertension, benign    Expressive aphasia    post CVA   Fatty liver    GERD (gastroesophageal reflux disease)    Gout 2016   Normal coronary arteries    a. 10/2008 abnl MV; b. 10/2008 Cath: nl cors.   Ovarian cyst    Permanent atrial fibrillation (HCC)    Stroke (HCC) 12/26/2019   Right sided weakness, and expressive aphasia   Thoracic ascending aortic aneurysm (HCC)    a. 4 cm 10/31/19 CTA; b. 02/2023 Echo: Nl Ao root.   Type 2 diabetes mellitus (HCC)    type II     Allergies  Allergen Reactions   Bee Venom Shortness Of Breath and Swelling    Bodily Swelling   Losartan  Other (See Comments)    Nosebleeds   Naproxen  Other (See Comments)    Acid reflux   Lisinopril  Other (See Comments)    Sinus congestion   Penicillins Nausea Only     Current Outpatient Medications  Medication Sig Dispense Refill   Accu-Chek Softclix Lancets  lancets Use as instructed 100 each 12   apixaban  (ELIQUIS ) 5 MG TABS tablet Take 1 tablet by mouth twice daily 180 tablet 1   aspirin  EC 81 MG tablet Take 1 tablet (81 mg total) by mouth daily. Swallow whole. 90 tablet 3   Blood Glucose Monitoring Suppl (ACCU-CHEK GUIDE ME) w/Device KIT 1 Device by Does not apply route 3 (three) times daily. 1 kit 0   carvedilol  (COREG ) 25 MG tablet TAKE 2 TABLETS BY MOUTH TWICE DAILY WITH A MEAL 360 tablet 1   Dulaglutide  (TRULICITY ) 4.5 MG/0.5ML SOAJ Inject 4.5 mg into the skin daily in the afternoon. (Patient taking differently: Inject 4.5 mg into the skin once a week.) 6 mL 3   empagliflozin  (JARDIANCE ) 25 MG TABS tablet Take 1 tablet (25 mg total) by mouth daily before breakfast. 90 tablet 3   ferrous sulfate  (FEROSUL) 325 (65 FE) MG tablet Take 1 tablet by mouth once daily 90 tablet 1   furosemide  (LASIX )  20 MG tablet TAKE 1 TABLET BY MOUTH ONCE DAILY (MAY TAKE ADDITIONAL 20 MG IF WEIGHT GAIN IS OVER 2LBS OVERNIGHT OR 5 LBS IN A WEEK) 105 tablet 1   gabapentin  (NEURONTIN ) 100 MG capsule TAKE 2 CAPSULES BY MOUTH THREE TIMES DAILY 180 capsule 1   glucose blood (ACCU-CHEK GUIDE TEST) test strip USE 1 STRIP TO CHECK GLUCOSE THREE TIMES DAILY 300 each 5   hydrALAZINE  (APRESOLINE ) 25 MG tablet TAKE 1 TABLET BY MOUTH THREE TIMES DAILY 270 tablet 1   insulin  glargine (LANTUS  SOLOSTAR) 100 UNIT/ML Solostar Pen Inject 44 Units into the skin daily. (Patient taking differently: Inject 44 Units into the skin at bedtime.) 45 mL 3   insulin  lispro (HUMALOG  KWIKPEN) 100 UNIT/ML KwikPen Inject 12 Units into the skin 3 (three) times daily. 15 mL 11   Insulin  Pen Needle (BD PEN NEEDLE NANO U/F) 32G X 4 MM MISC Inject 1 Device into the skin in the morning, at noon, in the evening, and at bedtime. 400 each 3   isosorbide  mononitrate (IMDUR ) 30 MG 24 hr tablet Take 2 tablets by mouth once daily 180 tablet 1   Oxcarbazepine  (TRILEPTAL ) 300 MG tablet Take 1 tablet (300 mg total) by mouth 2 (two) times daily. 60 tablet 6   pantoprazole  (PROTONIX ) 40 MG tablet Take 1 tablet (40 mg total) by mouth daily. 90 tablet 1   timolol  (TIMOPTIC ) 0.5 % ophthalmic solution Place 1 drop into both eyes 2 (two) times daily.     tobramycin  (TOBREX ) 0.3 % ophthalmic solution Place 1 drop into both eyes 4 (four) times daily.     No current facility-administered medications for this visit.     Past Surgical History:  Procedure Laterality Date   ABDOMINAL HYSTERECTOMY  09/10/2011   Procedure: HYSTERECTOMY ABDOMINAL;  Surgeon: Albino Hum, MD;  Location: [facility];  Service: Gynecology;  Laterality: N/A;  Abdominal hysterectomy   CESAREAN SECTION  8413,2440, and 1994   CHOLECYSTECTOMY  1995   IR 3D INDEPENDENT WKST  03/16/2020   IR ANGIO INTRA EXTRACRAN SEL INTERNAL CAROTID BILAT MOD SED  03/16/2020   IR ANGIO VERTEBRAL SEL SUBCLAVIAN  INNOMINATE UNI L MOD SED  03/16/2020   IR ANGIO VERTEBRAL SEL VERTEBRAL UNI R MOD SED  03/16/2020   IR CT HEAD LTD  12/27/2019   IR PERCUTANEOUS ART THROMBECTOMY/INFUSION INTRACRANIAL INC DIAG ANGIO  12/27/2019   IR US  GUIDE VASC ACCESS RIGHT  12/27/2019   IR US  GUIDE VASC ACCESS RIGHT  03/16/2020  RADIOLOGY WITH ANESTHESIA N/A 12/27/2019   Procedure: IR WITH ANESTHESIA;  Surgeon: Luellen Sages, MD;  Location: [facility];  Service: Radiology;  Laterality: N/A;   SCAR REVISION  09/10/2011   Procedure: SCAR REVISION;  Surgeon: Albino Hum, MD;  Location: [facility];  Service: Gynecology;  Laterality: N/A;  Wide Excision of old Cicatrix   TUBAL LIGATION  1994     Allergies  Allergen Reactions   Bee Venom Shortness Of Breath and Swelling    Bodily Swelling   Losartan  Other (See Comments)    Nosebleeds   Naproxen  Other (See Comments)    Acid reflux   Lisinopril  Other (See Comments)    Sinus congestion   Penicillins Nausea Only      Family History  Problem Relation Age of Onset   Cirrhosis Mother    Early death Mother    Alcohol  abuse Mother    Diabetes type II Father    Alcohol  abuse Father    Diabetes type II Sister    Early death Brother    Alcohol  abuse Son    Anesthesia problems Neg Hx    Hypotension Neg Hx    Malignant hyperthermia Neg Hx    Pseudochol deficiency Neg Hx    Colon cancer Neg Hx    Colon polyps Neg Hx    Esophageal cancer Neg Hx    Rectal cancer Neg Hx    Stomach cancer Neg Hx      Social History Ms. Sudano reports that she quit smoking about 4 years ago. Her smoking use included cigarettes. She started smoking about 33 years ago. She has never used smokeless tobacco. Ms. Kot reports that she does not currently use alcohol .    Physical Examination Today's Vitals   01/12/24 1258  BP: 126/78  Pulse: 82  SpO2: 96%  Weight: 237 lb (107.5 kg)  Height: 5\' 6"  (1.676 m)  PainSc: 4   PainLoc: Leg   Body mass index is 38.25 kg/m.  Gen: resting  comfortably, no acute distress HEENT: no scleral icterus, pupils equal round and reactive, no palptable cervical adenopathy,  CV: irreg, 2/6 systolic murmur rusb, no jvd Resp: Clear to auscultation bilaterally GI: abdomen is soft, non-tender, non-distended, normal bowel sounds, no hepatosplenomegaly MSK: extremities are warm, no edema.  Skin: warm, no rash Neuro:  no focal deficits Psych: appropriate affect   Diagnostic Studies  Cardiac Catheterization: 2010 ANGIOGRAPHIC DATA:  1. Ventriculography done in the RAO projection revealed preserved      global systolic function.  2. Aortic root aortography revealed a normal aorta.  3. On plain fluoroscopy, there was some evidence of calcification over      the surface of the heart, the exact location unclear, as it did not      appear to be attached to the right coronary artery.  4. The right coronary artery is a small codominant vessel providing a      small posterior descending Yola Paradiso.  It is smooth and without      critical narrowing.  5. The left main is free of critical disease.  6. The left anterior descending artery is a large-caliber vessel that      courses to the apex.  The mid and distal LAD appeared to be normal      without significant obstruction.  Likewise, the proximal LAD      appears to be widely patent in multiple views.  There is an      overlapping proximal Faline Langer  which may constitute a small combined      septal diagonal and there is also a small-to-moderate diagonal      which comes off and takes a steep bend proximally and overlaps the      proximal vessel.  Despite multiple views, this is somewhat      difficult to see, but the mid and distal portion of this appears to      be quite smooth.  The proximal bend is difficult to visualize in      almost any view, but on careful analysis high-grade obstruction is      not particularly noted.  7. The circumflex provides a codominant system with several large       marginal branches.  In laying this out carefully, particularly in      the LAO caudal views, high-grade obstruction proximally is not      seen.  The distal vessels are without critical narrowing.    CONCLUSIONS:  1. Preserved left ventricular function.  2. No significant high-grade obstruction noted with 2 proximal      diagonal branches that overlap as noted in the above study.  3. No evidence of aortic dissection.    RECOMMENDATIONS:  We will have the patient follow up with Dr. Danise Durie for  continued followup.  I spent extensive time with the patient explaining  vascular biology, smoking, diabetes, hypertension and their interaction  and a potential for ACS down the road.  Risk factor reduction will  clearly be necessary, and it will imperative that the patient  understands this.  I have reviewed it in extensive detail with her.  She  will follow up with Dr. Danise Durie.   Echocardiogram: 07/2016 Study Conclusions   - Left ventricle: The cavity size was normal. Wall thickness was   increased in a pattern of moderate LVH. Systolic function was   normal. The estimated ejection fraction was in the range of 50%   to 55%. Diffuse hypokinesis. Doppler parameters are consistent   with restrictive physiology, indicative of decreased left   ventricular diastolic compliance and/or increased left atrial   pressure. - Aortic valve: Mildly calcified annulus. Functionally bicuspid;   mildly calcified leaflets. There was moderate stenosis. Mean   gradient (S): 14 mm Hg. Peak gradient (S): 25 mm Hg. VTI ratio of   LVOT to aortic valve: 0.32. Valve area (VTI): 1.01 cm^2. Valve   area (Vmax): 1.12 cm^2. - Mitral valve: Mildly thickened leaflets . There was trivial   regurgitation. - Left atrium: The atrium was moderately to severely dilated. - Tricuspid valve: There was trivial regurgitation. - Pulmonary arteries: PA peak pressure: 24 mm Hg (S). - Pericardium, extracardiac: A trivial pericardial  effusion was   identified posterior to the heart.   Impressions:   - Moderate LVH with LVEF 50-55%. Restrictive diastolic filling   pattern. Moderate to severe left atrial enlargement. Mildly   thickened mitral leaflets with trivial mitral regurgitation.   Functionally bicuspid aortic valve with evidence of moderate   aortic stenosis as outlined above. Trivial tricuspid   regurgitation with PASP 24 mmHg. Trivial posterior pericardial   effusion.   Echocardiogram: 08/24/2018 Study Conclusions   - Left ventricle: Diffuse hypokinesis worse in the inferior basal   and posterior lateral walls. The cavity size was mildly dilated.   Wall thickness was increased in a pattern of mild LVH. Systolic   function was moderately to severely reduced. The estimated   ejection fraction was in the range  of 30% to 35%. Doppler   parameters are consistent with both elevated ventricular   end-diastolic filling pressure and elevated left atrial filling   pressure. - Aortic valve: There was mild stenosis. - Left atrium: The atrium was moderately dilated. - Atrial septum: No defect or patent foramen ovale was identified.   02/2023 echo 1. Left ventricular ejection fraction, by estimation, is 40 to 45%. The  left ventricle has mildly decreased function. The left ventricle  demonstrates global hypokinesis. There is moderate left ventricular  hypertrophy. Left ventricular diastolic  parameters are indeterminate.   2. Right ventricular systolic function is low normal. The right  ventricular size is normal. Tricuspid regurgitation signal is inadequate  for assessing PA pressure.   3. Left atrial size was severely dilated.   4. Right atrial size was severely dilated.   5. The mitral valve is normal in structure. No evidence of mitral valve  regurgitation. No evidence of mitral stenosis.   6. Mild to moderate aortic stenosis. Lower than expected likely due to  low stroke volume index of 26. . The aortic  valve is tricuspid. There is  mild calcification of the aortic valve. There is mild thickening of the  aortic valve. Aortic valve  regurgitation is not visualized. Mild to moderate aortic valve stenosis.  Aortic valve mean gradient measures 14.0 mmHg. Aortic valve peak gradient  measures 21.9 mmHg. Aortic valve area, by VTI measures 1.05 cm.   7. The inferior vena cava is dilated in size with <50% respiratory  variability, suggesting right atrial pressure of 15 mmHg.    Assessment and Plan  1.Chronic HFrEF - labile LVEF as reported above. Most recent drop in LVEF was thought to be perhaps tachy mediated - medical therapy limited by renaldysfunction - continue current meds   2. Afib/acqurired thrombophilia - overall doing well, continue current meds including eliquis  for stroke prevention   3.HTN - bp is at goal, continue current mes   4. DM2 - per pcp - not on statin, I am unclear when this was stopped. Was on crestor  and lipitor at points in the past - review records further, with DM2 and prior CVA would recommend statin unless there was some adverse issue noted by another provider  5. Aortic stenosis - repeat echo   F/u 6 months     Laurann Pollock, M.D

## 2024-01-29 ENCOUNTER — Telehealth: Payer: Self-pay | Admitting: Cardiology

## 2024-01-29 NOTE — Telephone Encounter (Signed)
 Caller Stevie) reports they have not been able to reach this patient to get her scheduled for her echocardiogram.

## 2024-01-29 NOTE — Telephone Encounter (Signed)
 Noted

## 2024-01-30 ENCOUNTER — Encounter (HOSPITAL_COMMUNITY): Payer: Self-pay | Admitting: Interventional Radiology

## 2024-02-01 ENCOUNTER — Other Ambulatory Visit: Payer: Self-pay | Admitting: Physician Assistant

## 2024-03-29 ENCOUNTER — Telehealth: Payer: Self-pay | Admitting: *Deleted

## 2024-03-29 NOTE — Telephone Encounter (Signed)
 Copied from CRM #[id number]. Topic: Clinical - Medical Advice >> Mar 29, 2024  3:46 PM Terri G wrote: Reason for CRM: [place] with [employer] stated they sent a fax over last week stating they would like patient to start taking the medication statin and wanted to know if the Dr had a chance to look over that. Please call [phone]

## 2024-03-30 NOTE — Telephone Encounter (Signed)
 Pt sees Cardiology.

## 2024-04-07 ENCOUNTER — Other Ambulatory Visit: Payer: Self-pay | Admitting: Cardiology

## 2024-04-07 ENCOUNTER — Encounter: Payer: Self-pay | Admitting: Internal Medicine

## 2024-04-07 ENCOUNTER — Other Ambulatory Visit: Payer: Self-pay | Admitting: Physician Assistant

## 2024-04-07 ENCOUNTER — Ambulatory Visit (INDEPENDENT_AMBULATORY_CARE_PROVIDER_SITE_OTHER): Admitting: Internal Medicine

## 2024-04-07 ENCOUNTER — Ambulatory Visit: Admitting: Physician Assistant

## 2024-04-07 VITALS — BP 124/82 | HR 88 | Ht 66.0 in | Wt 239.0 lb

## 2024-04-07 DIAGNOSIS — Z794 Long term (current) use of insulin: Secondary | ICD-10-CM | POA: Diagnosis not present

## 2024-04-07 DIAGNOSIS — E1165 Type 2 diabetes mellitus with hyperglycemia: Secondary | ICD-10-CM | POA: Diagnosis not present

## 2024-04-07 DIAGNOSIS — E1122 Type 2 diabetes mellitus with diabetic chronic kidney disease: Secondary | ICD-10-CM | POA: Diagnosis not present

## 2024-04-07 DIAGNOSIS — E1143 Type 2 diabetes mellitus with diabetic autonomic (poly)neuropathy: Secondary | ICD-10-CM | POA: Diagnosis not present

## 2024-04-07 DIAGNOSIS — N1831 Chronic kidney disease, stage 3a: Secondary | ICD-10-CM

## 2024-04-07 DIAGNOSIS — E1159 Type 2 diabetes mellitus with other circulatory complications: Secondary | ICD-10-CM | POA: Diagnosis not present

## 2024-04-07 LAB — POCT GLYCOSYLATED HEMOGLOBIN (HGB A1C): Hemoglobin A1C: 8 % — AB (ref 4.0–5.6)

## 2024-04-07 LAB — POCT GLUCOSE (DEVICE FOR HOME USE): POC Glucose: 265 mg/dL — AB (ref 70–99)

## 2024-04-07 NOTE — Patient Instructions (Signed)
-   Continue Trulicity  4.5 mg ONCE weekly  - Continue  Jardiance  25 mg daily  - Continue Lantus   44  units daily  - Continue Novolog  12 units with Breakfast, Lunch and Dinner    HOW TO TREAT LOW BLOOD SUGARS (Blood sugar LESS THAN 70 MG/DL) Please follow the RULE OF 15 for the treatment of hypoglycemia treatment (when your (blood sugars are less than 70 mg/dL)   STEP 1: Take 15 grams of carbohydrates when your blood sugar is low, which includes:  3-4 GLUCOSE TABS  OR 3-4 OZ OF JUICE OR REGULAR SODA OR ONE TUBE OF GLUCOSE GEL    STEP 2: RECHECK blood sugar in 15 MINUTES STEP 3: If your blood sugar is still low at the 15 minute recheck --> then, go back to STEP 1 and treat AGAIN with another 15 grams of carbohydrates.

## 2024-04-07 NOTE — Progress Notes (Signed)
 Why   Name: Robin [name]  Age/ Sex: 58 y.o., female   MRN/ DOB: [id number], [birth year]     PCP: Job Lukes, PA   Reason for Endocrinology Evaluation: Type 2 Diabetes Mellitus  Initial Endocrine Consultative Visit: 10/01/2018    PATIENT IDENTIFIER: Robin [name] is a 58 y.o. female with a past medical history of T2DM, CHF, COPD , CVA, A.Fib with RVR and GERD. The patient has followed with Endocrinology clinic since 10/01/2018 for consultative assistance with management of her diabetes.  DIABETIC HISTORY:  Robin [name] was diagnosed with DM > 20 yrs ago. Has been on Janumet  and Glipizide in the past. Has been on insulin  for years. Her hemoglobin A1c has ranged from 7.9% in 2017, peaking at 11.3% in 2020  Started trulicity  06/2021 Started  Jardiance  04/2022  SUBJECTIVE:   During the last visit (10/06/2023): A1c 7.9%    Today (04/07/2024): Robin [name] is here for a follow up on diabetes management.  She is accompanied by her sister today.  She checks glucose 2-3x  a day. No hypoglycemia .  No glucose logs.   Patient continues to follow up  with cardiology for A-fib, CHF Patient established with neurology for evaluation of focal seizures, switch Keppra  to oxcarbazepine  due to headaches with Keppra . Headaches have resolved   Denies nausea or vomiting  Continues with occasional constipation - on Miralax    Continues with 9 AM-1 pm and 4 pm - 9 pm    HOME DIABETES REGIMEN:  Jardiance  25 mg daily  Trulicity  4.5 mg weekly  Lantus  44 units daily NovoLog  12 units 3 times daily before every meal    METER DOWNLOAD SUMMARY: n/a    DIABETIC COMPLICATIONS: Microvascular complications:  Neuropathy Denies: CKD, retinopathy  Last eye exam: Completed many years ago    Macrovascular complications:  CVA 01/2020 Denies: CAD, PVA    HISTORY:  Past Medical History:  Past Medical History:  Diagnosis Date   Allergy    Anemia    Aortic stenosis    a. 02/2023 Echo: mild-mod AS.    Arthritis    Chronic abdominal pain    Chronic HFrEF (heart failure with reduced ejection fraction) (HCC)    a. EF at 30-35% by echo in 08/2018 b. EF at 35% by repeat echo in 05/2019 c. EF improved to 65-70% in 2021 d. EF at 40-45% in 08/2021; e. 02/2023 Echo: 40-45%, glob HK, mod LVH, nl RV size/fx, sev BAE, mild-mod AS.   Cocaine abuse (HCC)    COPD (chronic obstructive pulmonary disease) (HCC)    Diabetes mellitus without complication (HCC)    Essential hypertension, benign    Expressive aphasia    post CVA   Fatty liver    GERD (gastroesophageal reflux disease)    Gout 2016   Normal coronary arteries    a. 10/2008 abnl MV; b. 10/2008 Cath: nl cors.   Ovarian cyst    Permanent atrial fibrillation (HCC)    Stroke (HCC) 12/26/2019   Right sided weakness, and expressive aphasia   Thoracic ascending aortic aneurysm (HCC)    a. 4 cm 10/31/19 CTA; b. 02/2023 Echo: Nl Ao root.   Type 2 diabetes mellitus (HCC)    type II   Past Surgical History:  Past Surgical History:  Procedure Laterality Date   ABDOMINAL HYSTERECTOMY  09/10/2011   Procedure: HYSTERECTOMY ABDOMINAL;  Surgeon: Norleen LULLA Server, MD;  Location: [facility];  Service: Gynecology;  Laterality: N/A;  Abdominal hysterectomy   CESAREAN  SECTION  8011,8007, and 1994   CHOLECYSTECTOMY  1995   IR 3D INDEPENDENT WKST  03/16/2020   IR ANGIO INTRA EXTRACRAN SEL INTERNAL CAROTID BILAT MOD SED  03/16/2020   IR ANGIO VERTEBRAL SEL SUBCLAVIAN INNOMINATE UNI L MOD SED  03/16/2020   IR ANGIO VERTEBRAL SEL VERTEBRAL UNI R MOD SED  03/16/2020   IR CT HEAD LTD  12/27/2019   IR PERCUTANEOUS ART THROMBECTOMY/INFUSION INTRACRANIAL INC DIAG ANGIO  12/27/2019   IR US  GUIDE VASC ACCESS RIGHT  12/27/2019   IR US  GUIDE VASC ACCESS RIGHT  03/16/2020   RADIOLOGY WITH ANESTHESIA N/A 12/27/2019   Procedure: IR WITH ANESTHESIA;  Surgeon: Dolphus Carrion, MD;  Location: [facility];  Service: Radiology;  Laterality: N/A;   SCAR REVISION  09/10/2011   Procedure: SCAR  REVISION;  Surgeon: Norleen LULLA Server, MD;  Location: [facility];  Service: Gynecology;  Laterality: N/A;  Wide Excision of old Cicatrix   TUBAL LIGATION  1994   Social History:  reports that she quit smoking about 4 years ago. Her smoking use included cigarettes. She started smoking about 33 years ago. She has never used smokeless tobacco. She reports that she does not currently use alcohol . She reports that she does not currently use drugs. Family History:  Family History  Problem Relation Age of Onset   Cirrhosis Mother    Early death Mother    Alcohol  abuse Mother    Diabetes type II Father    Alcohol  abuse Father    Diabetes type II Sister    Early death Brother    Alcohol  abuse Son    Anesthesia problems Neg Hx    Hypotension Neg Hx    Malignant hyperthermia Neg Hx    Pseudochol deficiency Neg Hx    Colon cancer Neg Hx    Colon polyps Neg Hx    Esophageal cancer Neg Hx    Rectal cancer Neg Hx    Stomach cancer Neg Hx      HOME MEDICATIONS: Allergies as of 04/07/2024       Reactions   Bee Venom Shortness Of Breath, Swelling   Bodily Swelling   Losartan  Other (See Comments)   Nosebleeds   Naproxen  Other (See Comments)   Acid reflux   Lisinopril  Other (See Comments)   Sinus congestion   Penicillins Nausea Only        Medication List        Accurate as of April 07, 2024 11:17 AM. If you have any questions, ask your nurse or doctor.          Accu-Chek Guide Me w/Device Kit 1 Device by Does not apply route 3 (three) times daily.   Accu-Chek Guide Test test strip Generic drug: glucose blood USE 1 STRIP TO CHECK GLUCOSE THREE TIMES DAILY   Accu-Chek Softclix Lancets lancets Use as instructed   aspirin  EC 81 MG tablet Take 1 tablet (81 mg total) by mouth daily. Swallow whole.   BD Pen Needle Nano U/F 32G X 4 MM Misc Generic drug: Insulin  Pen Needle Inject 1 Device into the skin in the morning, at noon, in the evening, and at bedtime.   carvedilol  25 MG  tablet Commonly known as: COREG  TAKE 2 TABLETS BY MOUTH TWICE DAILY WITH A MEAL   Eliquis  5 MG Tabs tablet Generic drug: apixaban  Take 1 tablet by mouth twice daily   empagliflozin  25 MG Tabs tablet Commonly known as: Jardiance  Take 1 tablet (25 mg total) by mouth daily  before breakfast.   furosemide  20 MG tablet Commonly known as: LASIX  TAKE 1 TABLET BY MOUTH ONCE DAILY (MAY TAKE ADDITIONAL 20 MG IF WEIGHT GAIN IS OVER 2LBS OVERNIGHT OR 5 LBS IN A WEEK)   gabapentin  100 MG capsule Commonly known as: NEURONTIN  Take 2 capsules (200 mg total) by mouth 3 (three) times daily.   hydrALAZINE  25 MG tablet Commonly known as: APRESOLINE  TAKE 1 TABLET BY MOUTH THREE TIMES DAILY   insulin  lispro 100 UNIT/ML KwikPen Commonly known as: HumaLOG  KwikPen Inject 12 Units into the skin 3 (three) times daily.   Iron 325 (65 Fe) MG Tabs Take 1 tablet by mouth once daily   isosorbide  mononitrate 30 MG 24 hr tablet Commonly known as: IMDUR  Take 2 tablets by mouth once daily   Lantus  SoloStar 100 UNIT/ML Solostar Pen Generic drug: insulin  glargine Inject 44 Units into the skin daily. What changed: when to take this   Oxcarbazepine  300 MG tablet Commonly known as: Trileptal  Take 1 tablet (300 mg total) by mouth 2 (two) times daily. What changed: how much to take   pantoprazole  40 MG tablet Commonly known as: PROTONIX  Take 1 tablet (40 mg total) by mouth daily.   timolol  0.5 % ophthalmic solution Commonly known as: TIMOPTIC  Place 1 drop into both eyes 2 (two) times daily.   tobramycin  0.3 % ophthalmic solution Commonly known as: TOBREX  Place 1 drop into both eyes 4 (four) times daily.   Trulicity  4.5 MG/0.5ML Soaj Generic drug: Dulaglutide  Inject 4.5 mg into the skin daily in the afternoon. What changed: when to take this         OBJECTIVE:   Vital Signs: BP 124/82 (BP Location: Left Arm, Patient Position: Sitting, Cuff Size: Normal)   Pulse 88   Ht 5' 6 (1.676 m)   Wt  239 lb (108.4 kg)   LMP 08/21/2011   SpO2 98%   BMI 38.58 kg/m   Wt Readings from Last 3 Encounters:  04/07/24 239 lb (108.4 kg)  01/12/24 237 lb (107.5 kg)  12/21/23 231 lb (104.8 kg)     Exam: General: Pt appears well and is in NAD  Lungs: Clear with good BS bilat   Heart: RRR   Neuro: MS is good with appropriate affect    DM foot exam:05/09/2023 per podiatry  The skin of the feet is intact without sores or ulcerations. The pedal pulses are 2+ The sensation is decreased  to a screening 5.07, 10 gram monofilament bilaterally        DATA REVIEWED:  Lab Results  Component Value Date   HGBA1C 7.9 (A) 10/06/2023   HGBA1C 8.8 (A) 05/09/2023   HGBA1C 8.2 (H) 12/02/2022    Latest Reference Range & Units 09/28/23 06:52  Sodium 135 - 145 mmol/L 138  Potassium 3.5 - 5.1 mmol/L 3.7  Chloride 98 - 111 mmol/L 108  CO2 22 - 32 mmol/L 23  Glucose 70 - 99 mg/dL 895 (H)  BUN 6 - 20 mg/dL 20  Creatinine 9.55 - 8.99 mg/dL 8.68 (H)  Calcium  8.9 - 10.3 mg/dL 8.9  Anion gap 5 - 15  7  Phosphorus 2.5 - 4.6 mg/dL 2.7  Magnesium  1.7 - 2.4 mg/dL 2.0  Alkaline Phosphatase 38 - 126 U/L 70  Albumin 3.5 - 5.0 g/dL 3.5  AST 15 - 41 U/L 20  ALT 0 - 44 U/L 14  Total Protein 6.5 - 8.1 g/dL 7.0   In office BG 734 MGs/DL   ASSESSMENT / PLAN /  RECOMMENDATIONS:   1) Type 2 Diabetes Mellitus, Sub-Optimally controlled, With Neuropathic and CKD III  and macrovascular complications - Most recent A1c of  8.0  %. Goal A1c < 7.0 %.     -A1c continues to be above goal -Metformin  has been stopped due to low GFR -Main barriers to diabetes self care is memory issues and hx of CVA -She was not able to obtain the Dexcom - In office BG 265 MGs/DL, patient did eat breakfast but forgot to take NovoLog .  I suspect there is imperfect adherence to insulin  intake -I initially suggested increasing the Lantus  since her fasting this morning was 219 MGs/DL, but sister wants to verify that the caregivers are  actually making sure Robin [name] is injecting herself with insulin  before each meal -No changes at this time    MEDICATIONS: Continue  Lantus   44 units daily Continue NovoLog  12 units with each meal Continue  trulicity  4.5 mg weekly  Continue  Jardiance  25 mg daily  EDUCATION / INSTRUCTIONS: BG monitoring instructions: Patient is instructed to check her blood sugars 2 times a day, fasting and supper. Call [facility] if: BG persistently < 70  I reviewed the Rule of 15 for the treatment of hypoglycemia in detail with the patient. Literature supplied.   2) Diabetic complications:  Eye: Does not have known diabetic retinopathy.   Neuro/ Feet: Does  have known diabetic peripheral neuropathy .  Renal: Patient does  have known baseline CKD     F/U in 4 months    Signed electronically by: Stefano Redgie Butts, MD  [facility] Endocrinology  [facility] [address] [place], [name] [address] Phone: [phone] FAX: [phone]   CC: Job Lukes, [place] [address] [name] [address] Phone: [phone]  Fax: [phone]  Return to Endocrinology clinic as below: Future Appointments  Date Time Provider Department Center  06/23/2024  1:45 PM Gregg Lek, MD [facility] None  12/20/2024  1:40 PM [facility] ANNUAL WELLNESS VISIT 1 [facility] [place]

## 2024-05-05 ENCOUNTER — Other Ambulatory Visit: Payer: Self-pay | Admitting: Physician Assistant

## 2024-05-07 ENCOUNTER — Other Ambulatory Visit: Payer: Self-pay | Admitting: Physician Assistant

## 2024-05-23 ENCOUNTER — Other Ambulatory Visit: Payer: Self-pay | Admitting: Cardiology

## 2024-06-15 ENCOUNTER — Other Ambulatory Visit: Payer: Self-pay | Admitting: Physician Assistant

## 2024-06-15 DIAGNOSIS — I10 Essential (primary) hypertension: Secondary | ICD-10-CM

## 2024-06-23 ENCOUNTER — Ambulatory Visit: Admitting: Neurology

## 2024-07-02 ENCOUNTER — Other Ambulatory Visit: Payer: Self-pay | Admitting: Physician Assistant

## 2024-07-02 DIAGNOSIS — I10 Essential (primary) hypertension: Secondary | ICD-10-CM

## 2024-07-05 ENCOUNTER — Encounter: Payer: Self-pay | Admitting: Physician Assistant

## 2024-07-05 MED ORDER — APIXABAN 5 MG PO TABS: 5.0000 mg | ORAL_TABLET | Freq: Two times a day (BID) | ORAL | 0 refills | Status: AC

## 2024-07-09 ENCOUNTER — Other Ambulatory Visit: Payer: Self-pay | Admitting: Physician Assistant

## 2024-07-13 ENCOUNTER — Other Ambulatory Visit: Payer: Self-pay | Admitting: Physician Assistant

## 2024-07-30 ENCOUNTER — Other Ambulatory Visit: Payer: Self-pay

## 2024-07-30 ENCOUNTER — Encounter (HOSPITAL_COMMUNITY): Payer: Self-pay

## 2024-07-30 ENCOUNTER — Emergency Department (HOSPITAL_COMMUNITY)

## 2024-07-30 ENCOUNTER — Inpatient Hospital Stay (HOSPITAL_COMMUNITY)
Admission: EM | Admit: 2024-07-30 | Discharge: 2024-08-04 | DRG: 871 | Disposition: A | Discharge status: Skilled Nursing Facility | Attending: Internal Medicine | Admitting: Internal Medicine

## 2024-07-30 DIAGNOSIS — N1832 Chronic kidney disease, stage 3b: Secondary | ICD-10-CM | POA: Diagnosis present

## 2024-07-30 DIAGNOSIS — I350 Nonrheumatic aortic (valve) stenosis: Secondary | ICD-10-CM | POA: Diagnosis present

## 2024-07-30 DIAGNOSIS — J101 Influenza due to other identified influenza virus with other respiratory manifestations: Principal | ICD-10-CM | POA: Diagnosis present

## 2024-07-30 DIAGNOSIS — Z888 Allergy status to other drugs, medicaments and biological substances status: Secondary | ICD-10-CM

## 2024-07-30 DIAGNOSIS — Z79899 Other long term (current) drug therapy: Secondary | ICD-10-CM

## 2024-07-30 DIAGNOSIS — Z7984 Long term (current) use of oral hypoglycemic drugs: Secondary | ICD-10-CM

## 2024-07-30 DIAGNOSIS — Z833 Family history of diabetes mellitus: Secondary | ICD-10-CM

## 2024-07-30 DIAGNOSIS — Z880 Allergy status to penicillin: Secondary | ICD-10-CM

## 2024-07-30 DIAGNOSIS — E1122 Type 2 diabetes mellitus with diabetic chronic kidney disease: Secondary | ICD-10-CM | POA: Diagnosis present

## 2024-07-30 DIAGNOSIS — K760 Fatty (change of) liver, not elsewhere classified: Secondary | ICD-10-CM | POA: Diagnosis present

## 2024-07-30 DIAGNOSIS — I69320 Aphasia following cerebral infarction: Secondary | ICD-10-CM

## 2024-07-30 DIAGNOSIS — G9341 Metabolic encephalopathy: Secondary | ICD-10-CM | POA: Diagnosis present

## 2024-07-30 DIAGNOSIS — Z1152 Encounter for screening for COVID-19: Secondary | ICD-10-CM

## 2024-07-30 DIAGNOSIS — G40909 Epilepsy, unspecified, not intractable, without status epilepticus: Secondary | ICD-10-CM | POA: Diagnosis present

## 2024-07-30 DIAGNOSIS — J449 Chronic obstructive pulmonary disease, unspecified: Secondary | ICD-10-CM | POA: Diagnosis present

## 2024-07-30 DIAGNOSIS — I130 Hypertensive heart and chronic kidney disease with heart failure and stage 1 through stage 4 chronic kidney disease, or unspecified chronic kidney disease: Secondary | ICD-10-CM | POA: Diagnosis present

## 2024-07-30 DIAGNOSIS — Z6841 Body Mass Index (BMI) 40.0 and over, adult: Secondary | ICD-10-CM

## 2024-07-30 DIAGNOSIS — M109 Gout, unspecified: Secondary | ICD-10-CM | POA: Diagnosis present

## 2024-07-30 DIAGNOSIS — E1143 Type 2 diabetes mellitus with diabetic autonomic (poly)neuropathy: Secondary | ICD-10-CM | POA: Diagnosis present

## 2024-07-30 DIAGNOSIS — Z886 Allergy status to analgesic agent status: Secondary | ICD-10-CM

## 2024-07-30 DIAGNOSIS — I482 Chronic atrial fibrillation, unspecified: Secondary | ICD-10-CM | POA: Diagnosis present

## 2024-07-30 DIAGNOSIS — I2510 Atherosclerotic heart disease of native coronary artery without angina pectoris: Secondary | ICD-10-CM | POA: Diagnosis present

## 2024-07-30 DIAGNOSIS — I6340 Cerebral infarction due to embolism of unspecified cerebral artery: Secondary | ICD-10-CM | POA: Diagnosis present

## 2024-07-30 DIAGNOSIS — I5032 Chronic diastolic (congestive) heart failure: Secondary | ICD-10-CM | POA: Diagnosis present

## 2024-07-30 DIAGNOSIS — I4821 Permanent atrial fibrillation: Secondary | ICD-10-CM | POA: Diagnosis present

## 2024-07-30 DIAGNOSIS — Z87891 Personal history of nicotine dependence: Secondary | ICD-10-CM

## 2024-07-30 DIAGNOSIS — Z794 Long term (current) use of insulin: Secondary | ICD-10-CM

## 2024-07-30 DIAGNOSIS — I69353 Hemiplegia and hemiparesis following cerebral infarction affecting right non-dominant side: Secondary | ICD-10-CM

## 2024-07-30 DIAGNOSIS — Z811 Family history of alcohol abuse and dependence: Secondary | ICD-10-CM

## 2024-07-30 DIAGNOSIS — E1165 Type 2 diabetes mellitus with hyperglycemia: Secondary | ICD-10-CM

## 2024-07-30 DIAGNOSIS — Z7901 Long term (current) use of anticoagulants: Secondary | ICD-10-CM

## 2024-07-30 DIAGNOSIS — K219 Gastro-esophageal reflux disease without esophagitis: Secondary | ICD-10-CM | POA: Diagnosis present

## 2024-07-30 DIAGNOSIS — Z7982 Long term (current) use of aspirin: Secondary | ICD-10-CM

## 2024-07-30 DIAGNOSIS — A4189 Other specified sepsis: Principal | ICD-10-CM | POA: Diagnosis present

## 2024-07-30 DIAGNOSIS — E66813 Obesity, class 3: Secondary | ICD-10-CM | POA: Diagnosis present

## 2024-07-30 DIAGNOSIS — R6520 Severe sepsis without septic shock: Secondary | ICD-10-CM | POA: Diagnosis present

## 2024-07-30 DIAGNOSIS — E860 Dehydration: Secondary | ICD-10-CM | POA: Diagnosis present

## 2024-07-30 DIAGNOSIS — N179 Acute kidney failure, unspecified: Secondary | ICD-10-CM | POA: Diagnosis present

## 2024-07-30 DIAGNOSIS — Z91030 Bee allergy status: Secondary | ICD-10-CM

## 2024-07-30 LAB — CBC WITH DIFFERENTIAL/PLATELET
Abs Immature Granulocytes: 0.02 K/uL (ref 0.00–0.07)
Basophils Absolute: 0 K/uL (ref 0.0–0.1)
Basophils Relative: 0 %
Eosinophils Absolute: 0.2 K/uL (ref 0.0–0.5)
Eosinophils Relative: 2 %
HCT: 45.5 % (ref 36.0–46.0)
Hemoglobin: 14 g/dL (ref 12.0–15.0)
Immature Granulocytes: 0 %
Lymphocytes Relative: 15 %
Lymphs Abs: 1.1 K/uL (ref 0.7–4.0)
MCH: 27.1 pg (ref 26.0–34.0)
MCHC: 30.8 g/dL (ref 30.0–36.0)
MCV: 88 fL (ref 80.0–100.0)
Monocytes Absolute: 0.5 K/uL (ref 0.1–1.0)
Monocytes Relative: 7 %
Neutro Abs: 5.8 K/uL (ref 1.7–7.7)
Neutrophils Relative %: 76 %
Platelets: 246 K/uL (ref 150–400)
RBC: 5.17 MIL/uL — ABNORMAL HIGH (ref 3.87–5.11)
RDW: 15.1 % (ref 11.5–15.5)
WBC: 7.6 K/uL (ref 4.0–10.5)
nRBC: 0 % (ref 0.0–0.2)

## 2024-07-30 LAB — COMPREHENSIVE METABOLIC PANEL WITH GFR
ALT: 11 U/L (ref 0–44)
AST: 21 U/L (ref 15–41)
Albumin: 4.3 g/dL (ref 3.5–5.0)
Alkaline Phosphatase: 103 U/L (ref 38–126)
Anion gap: 14 (ref 5–15)
BUN: 25 mg/dL — ABNORMAL HIGH (ref 6–20)
CO2: 23 mmol/L (ref 22–32)
Calcium: 9.2 mg/dL (ref 8.9–10.3)
Chloride: 104 mmol/L (ref 98–111)
Creatinine, Ser: 2 mg/dL — ABNORMAL HIGH (ref 0.44–1.00)
GFR, Estimated: 28 mL/min — ABNORMAL LOW
Glucose, Bld: 109 mg/dL — ABNORMAL HIGH (ref 70–99)
Potassium: 4.2 mmol/L (ref 3.5–5.1)
Sodium: 142 mmol/L (ref 135–145)
Total Bilirubin: 0.4 mg/dL (ref 0.0–1.2)
Total Protein: 7.4 g/dL (ref 6.5–8.1)

## 2024-07-30 LAB — RESP PANEL BY RT-PCR (RSV, FLU A&B, COVID)  RVPGX2
Influenza A by PCR: POSITIVE — AB
Influenza B by PCR: NEGATIVE
Resp Syncytial Virus by PCR: NEGATIVE
SARS Coronavirus 2 by RT PCR: NEGATIVE

## 2024-07-30 LAB — LACTIC ACID, PLASMA: Lactic Acid, Venous: 1 mmol/L (ref 0.5–1.9)

## 2024-07-30 LAB — PROTIME-INR
INR: 1.5 — ABNORMAL HIGH (ref 0.8–1.2)
Prothrombin Time: 18.6 s — ABNORMAL HIGH (ref 11.4–15.2)

## 2024-07-30 MED ORDER — LACTATED RINGERS IV BOLUS (SEPSIS)
1000.0000 mL | Freq: Once | INTRAVENOUS | Status: AC
  Administered 2024-07-30: 1000 mL via INTRAVENOUS

## 2024-07-30 MED ORDER — LACTATED RINGERS IV SOLN: INTRAVENOUS | Status: DC

## 2024-07-30 MED ORDER — OSELTAMIVIR PHOSPHATE 30 MG PO CAPS: 30.0000 mg | ORAL_CAPSULE | Freq: Every day | ORAL | Status: DC

## 2024-07-30 MED ORDER — SODIUM CHLORIDE 0.9 % IV SOLN
500.0000 mg | Freq: Once | INTRAVENOUS | Status: AC
  Administered 2024-07-30: 500 mg via INTRAVENOUS
  Filled 2024-07-30: qty 5

## 2024-07-30 MED ORDER — SODIUM CHLORIDE 0.9 % IV SOLN
2.0000 g | Freq: Once | INTRAVENOUS | Status: AC
  Administered 2024-07-30: 2 g via INTRAVENOUS
  Filled 2024-07-30: qty 20

## 2024-07-30 NOTE — ED Provider Notes (Signed)
 " [place] EMERGENCY DEPARTMENT AT [facility] Provider Note   CSN: [id number] Arrival date & time: 07/30/24  2218     Patient presents with: Altered Mental Status   Robin [name] is a 58 y.o. female.    Altered Mental Status Patient brought in for altered mental status.  Reportedly had called son earlier today.  Has reported the patient at baseline.  However discussed with EMS that he is functional with her ADLs and walks with a cane at times upon arrival found with fever.  Points to her chest with    Past Medical History:  Diagnosis Date   Allergy    Anemia    Aortic stenosis    a. 02/2023 Echo: mild-mod AS.   Arthritis    Chronic abdominal pain    Chronic HFrEF (heart failure with reduced ejection fraction) (HCC)    a. EF at 30-35% by echo in 08/2018 b. EF at 35% by repeat echo in 05/2019 c. EF improved to 65-70% in 2021 d. EF at 40-45% in 08/2021; e. 02/2023 Echo: 40-45%, glob HK, mod LVH, nl RV size/fx, sev BAE, mild-mod AS.   Cocaine abuse (HCC)    COPD (chronic obstructive pulmonary disease) (HCC)    Diabetes mellitus without complication (HCC)    Essential hypertension, benign    Expressive aphasia    post CVA   Fatty liver    GERD (gastroesophageal reflux disease)    Gout 2016   Normal coronary arteries    a. 10/2008 abnl MV; b. 10/2008 Cath: nl cors.   Ovarian cyst    Permanent atrial fibrillation (HCC)    Stroke (HCC) 12/26/2019   Right sided weakness, and expressive aphasia   Thoracic ascending aortic aneurysm    a. 4 cm 10/31/19 CTA; b. 02/2023 Echo: Nl Ao root.   Type 2 diabetes mellitus (HCC)    type II    Prior to Admission medications  Medication Sig Start Date End Date Taking? Authorizing Provider  Accu-Chek Softclix Lancets lancets Use as instructed 06/13/21   Shamleffer, Donell Cardinal, MD  apixaban  (ELIQUIS ) 5 MG TABS tablet Take 1 tablet (5 mg total) by mouth 2 (two) times daily. 07/05/24   Job Lukes, PA  aspirin  EC 81 MG tablet  Take 1 tablet (81 mg total) by mouth daily. Swallow whole. 07/19/20   Strader, Laymon HERO, PA-C  Blood Glucose Monitoring Suppl (ACCU-CHEK GUIDE ME) w/Device KIT 1 Device by Does not apply route 3 (three) times daily. 03/09/21   Shamleffer, Ibtehal Jaralla, MD  carvedilol  (COREG ) 25 MG tablet TAKE 2 TABLETS BY MOUTH TWICE DAILY WITH A MEAL 04/07/24   Alvan Dorn FALCON, MD  Dulaglutide  (TRULICITY ) 4.5 MG/0.5ML SOAJ Inject 4.5 mg into the skin daily in the afternoon. Patient taking differently: Inject 4.5 mg into the skin once a week. 10/06/23   Shamleffer, Ibtehal Jaralla, MD  empagliflozin  (JARDIANCE ) 25 MG TABS tablet Take 1 tablet (25 mg total) by mouth daily before breakfast. 10/06/23   Shamleffer, Ibtehal Jaralla, MD  Ferrous Sulfate  (IRON) 325 (65 Fe) MG TABS Take 1 tablet by mouth once daily 07/14/24   Worley, Samantha, PA  furosemide  (LASIX ) 20 MG tablet TAKE 1 TABLET BY MOUTH ONCE DAILY (MAY TAKE ADDITIONAL 20 MG IF WEIGHT GAIN IS OVER 2 LBS OVERNIGHT OR 5 LBS IN A WEEK) 05/24/24   Alvan Dorn FALCON, MD  gabapentin  (NEURONTIN ) 100 MG capsule TAKE 2 CAPSULES BY MOUTH THREE TIMES DAILY 07/09/24   Worley, Samantha, PA  glucose blood (ACCU-CHEK GUIDE TEST) test strip USE 1 STRIP TO CHECK GLUCOSE THREE TIMES DAILY 12/15/23   Shamleffer, Ibtehal Jaralla, MD  hydrALAZINE  (APRESOLINE ) 25 MG tablet TAKE 1 TABLET BY MOUTH THREE TIMES DAILY 06/15/24   Job Lukes, PA  insulin  glargine (LANTUS  SOLOSTAR) 100 UNIT/ML Solostar Pen Inject 44 Units into the skin daily. Patient taking differently: Inject 44 Units into the skin at bedtime. 10/06/23   Shamleffer, Ibtehal Jaralla, MD  insulin  lispro (HUMALOG  KWIKPEN) 100 UNIT/ML KwikPen Inject 12 Units into the skin 3 (three) times daily. 01/09/24   Motwani, Komal, MD  Insulin  Pen Needle (BD PEN NEEDLE NANO U/F) 32G X 4 MM MISC Inject 1 Device into the skin in the morning, at noon, in the evening, and at bedtime. 10/06/23   Shamleffer, Ibtehal Jaralla, MD  isosorbide   mononitrate (IMDUR ) 30 MG 24 hr tablet Take 2 tablets by mouth once daily 07/05/24   Worley, Samantha, PA  Oxcarbazepine  (TRILEPTAL ) 300 MG tablet Take 1 tablet (300 mg total) by mouth 2 (two) times daily. Patient taking differently: Take 150 mg by mouth 2 (two) times daily. 12/02/23   Camara, Amadou, MD  pantoprazole  (PROTONIX ) 40 MG tablet Take 1 tablet by mouth once daily 07/09/24   Job Lukes, [place]  timolol  (TIMOPTIC ) 0.5 % ophthalmic solution Place 1 drop into both eyes 2 (two) times daily. 07/05/22   [provider]  tobramycin  (TOBREX ) 0.3 % ophthalmic solution Place 1 drop into both eyes 4 (four) times daily. 04/16/23   [provider]    Allergies: Bee venom, Losartan , Naproxen , Lisinopril , and Penicillins    Review of Systems  Updated Vital Signs BP (!) 130/97   Pulse (!) 114   Temp (!) 102.3 F (39.1 C)   Resp (!) 23   Ht 5' 6 (1.676 m)   Wt 113.4 kg   LMP 08/21/2011   SpO2 93%   BMI 40.35 kg/m   Physical Exam Vitals reviewed.  Cardiovascular:     Rate and Rhythm: Tachycardia present.  Pulmonary:     Comments: Diffuse harsh breath sounds. Abdominal:     Tenderness: There is no abdominal tenderness.  Musculoskeletal:     Cervical back: Neck supple.  Neurological:     Mental Status: She is alert.     Comments: Awake.  Had some aphasia.  Will point with left hand.  Not much movement on her right side.     (all labs ordered are listed, but only abnormal results are displayed) Labs Reviewed  RESP PANEL BY RT-PCR (RSV, FLU A&B, COVID)  RVPGX2 - Abnormal; Notable for the following components:      Result Value   Influenza A by PCR POSITIVE (*)    All other components within normal limits  COMPREHENSIVE METABOLIC PANEL WITH GFR - Abnormal; Notable for the following components:   Glucose, Bld 109 (*)    BUN 25 (*)    Creatinine, Ser 2.00 (*)    GFR, Estimated 28 (*)    All other components within normal limits  CBC WITH DIFFERENTIAL/PLATELET -  Abnormal; Notable for the following components:   RBC 5.17 (*)    All other components within normal limits  PROTIME-INR - Abnormal; Notable for the following components:   Prothrombin Time 18.6 (*)    INR 1.5 (*)    All other components within normal limits  CULTURE, BLOOD (ROUTINE X 2)  CULTURE, BLOOD (ROUTINE X 2)  LACTIC ACID, PLASMA  LACTIC ACID, PLASMA  URINALYSIS, W/  REFLEX TO CULTURE (INFECTION SUSPECTED)    EKG: EKG Interpretation Date/Time:  Friday July 30 2024 22:25:32 EST Ventricular Rate:  113 PR Interval:    QRS Duration:  92 QT Interval:  347 QTC Calculation: 476 R Axis:   81  Text Interpretation: Atrial fibrillation Probable anterior infarct, age indeterminate Confirmed by Patsey Lot [phone]) on 07/30/2024 10:37:47 PM  Radiology: [facility] Chest Port 1 View if patient is in a treatment room. Result Date: 07/30/2024 EXAM: 1 VIEW XRAY OF THE CHEST 07/30/2024 10:42:22 PM COMPARISON: Chest X-ray dated 09/23/2023 and CT Chest dated 09/23/2023. CLINICAL HISTORY: Suspected Sepsis FINDINGS: LUNGS AND PLEURA: No focal pulmonary opacity. No pleural effusion. No pneumothorax. HEART AND MEDIASTINUM: Cardiomegaly. Prominent hilar vasculature. The mediastinal silhouette is unchanged compared to the prior study. BONES AND SOFT TISSUES: No acute osseous abnormality. IMPRESSION: 1. No acute cardiopulmonary abnormality. Electronically signed by: Morgane Naveau MD 07/30/2024 11:21 PM EST RP Workstation: [id number]     Procedures   Medications Ordered in the ED  lactated ringers  infusion ( Intravenous New Bag/Given 07/30/24 2300)  lactated ringers  bolus 1,000 mL (1,000 mLs Intravenous New Bag/Given 07/30/24 2258)  azithromycin  (ZITHROMAX ) 500 mg in sodium chloride  0.9 % 250 mL IVPB (500 mg Intravenous New Bag/Given 07/30/24 2330)  oseltamivir  (TAMIFLU ) capsule 30 mg (has no administration in time range)  cefTRIAXone  (ROCEPHIN ) 2 g in sodium chloride  0.9 % 100 mL IVPB (0 g  Intravenous Stopped 07/30/24 2330)                                    Medical Decision Making Amount and/or Complexity of Data Reviewed Labs: ordered. Radiology: ordered.  Risk Prescription drug management. Decision regarding hospitalization.   Patient fever and mental status change.  Had previous stroke.  Reportedly had some aphasia at baseline.  Fever of 102 upon arrival.  Found to have coughing.  Harsh breath sounds.  Will treat pneumonia empirically.  Will get basic blood work.  Discussed with EMS.  Blood work overall reassuring.  Creatinine mildly elevated.  Lactic acid normal however.  Has been given antibiotics for presumed pneumonia but flu later came back positive.  Mental status improved somewhat after some fluids but still tachycardic.  I think the generalized weakness encephalopathy is in the status of positive influenza I feel she would benefit from admission to the hospital.  Discussed with hospitalist.  CRITICAL CARE Performed by: Lot Patsey Total critical care time: 30 minutes Critical care time was exclusive of separately billable procedures and treating other patients. Critical care was necessary to treat or prevent imminent or life-threatening deterioration. Critical care was time spent personally by me on the following activities: development of treatment plan with patient and/or surrogate as well as nursing, discussions with consultants, evaluation of patient's response to treatment, examination of patient, obtaining history from patient or surrogate, ordering and performing treatments and interventions, ordering and review of laboratory studies, ordering and review of radiographic studies, pulse oximetry and re-evaluation of patient's condition.      Final diagnoses:  Influenza A    ED Discharge Orders     None          Patsey Lot, MD 07/30/24 2354  "

## 2024-07-30 NOTE — ED Triage Notes (Signed)
 Arrives [facility] from home with reported Altered Mental Status.    Pt called son earlier today saying she hurt all over and didn't feel well. Son (does not live there) said she did not sound like normal.   Hx CVA with preexisting right sided deficits, seizure disorder and previous admission for encephalopathy.   Last known normal was earlier yesterday.

## 2024-07-30 NOTE — Sepsis Progress Note (Signed)
 Elink monitoring for the code sepsis protocol.

## 2024-07-31 ENCOUNTER — Inpatient Hospital Stay (HOSPITAL_COMMUNITY)

## 2024-07-31 ENCOUNTER — Emergency Department (HOSPITAL_COMMUNITY)

## 2024-07-31 DIAGNOSIS — I5032 Chronic diastolic (congestive) heart failure: Secondary | ICD-10-CM | POA: Diagnosis present

## 2024-07-31 DIAGNOSIS — E1143 Type 2 diabetes mellitus with diabetic autonomic (poly)neuropathy: Secondary | ICD-10-CM | POA: Diagnosis present

## 2024-07-31 DIAGNOSIS — R569 Unspecified convulsions: Secondary | ICD-10-CM | POA: Diagnosis not present

## 2024-07-31 DIAGNOSIS — N1832 Chronic kidney disease, stage 3b: Secondary | ICD-10-CM | POA: Diagnosis present

## 2024-07-31 DIAGNOSIS — Z794 Long term (current) use of insulin: Secondary | ICD-10-CM | POA: Diagnosis not present

## 2024-07-31 DIAGNOSIS — I69320 Aphasia following cerebral infarction: Secondary | ICD-10-CM | POA: Diagnosis not present

## 2024-07-31 DIAGNOSIS — K760 Fatty (change of) liver, not elsewhere classified: Secondary | ICD-10-CM | POA: Diagnosis present

## 2024-07-31 DIAGNOSIS — I130 Hypertensive heart and chronic kidney disease with heart failure and stage 1 through stage 4 chronic kidney disease, or unspecified chronic kidney disease: Secondary | ICD-10-CM | POA: Diagnosis present

## 2024-07-31 DIAGNOSIS — Z7901 Long term (current) use of anticoagulants: Secondary | ICD-10-CM | POA: Diagnosis not present

## 2024-07-31 DIAGNOSIS — Z6841 Body Mass Index (BMI) 40.0 and over, adult: Secondary | ICD-10-CM | POA: Diagnosis not present

## 2024-07-31 DIAGNOSIS — G40909 Epilepsy, unspecified, not intractable, without status epilepticus: Secondary | ICD-10-CM | POA: Diagnosis present

## 2024-07-31 DIAGNOSIS — N179 Acute kidney failure, unspecified: Secondary | ICD-10-CM | POA: Diagnosis present

## 2024-07-31 DIAGNOSIS — E1122 Type 2 diabetes mellitus with diabetic chronic kidney disease: Secondary | ICD-10-CM | POA: Diagnosis present

## 2024-07-31 DIAGNOSIS — R6520 Severe sepsis without septic shock: Secondary | ICD-10-CM | POA: Diagnosis present

## 2024-07-31 DIAGNOSIS — Z1152 Encounter for screening for COVID-19: Secondary | ICD-10-CM | POA: Diagnosis not present

## 2024-07-31 DIAGNOSIS — E66813 Obesity, class 3: Secondary | ICD-10-CM | POA: Diagnosis present

## 2024-07-31 DIAGNOSIS — I350 Nonrheumatic aortic (valve) stenosis: Secondary | ICD-10-CM | POA: Diagnosis present

## 2024-07-31 DIAGNOSIS — E860 Dehydration: Secondary | ICD-10-CM | POA: Diagnosis present

## 2024-07-31 DIAGNOSIS — J101 Influenza due to other identified influenza virus with other respiratory manifestations: Secondary | ICD-10-CM | POA: Diagnosis present

## 2024-07-31 DIAGNOSIS — I4821 Permanent atrial fibrillation: Secondary | ICD-10-CM | POA: Diagnosis present

## 2024-07-31 DIAGNOSIS — G9341 Metabolic encephalopathy: Secondary | ICD-10-CM | POA: Diagnosis present

## 2024-07-31 DIAGNOSIS — I69353 Hemiplegia and hemiparesis following cerebral infarction affecting right non-dominant side: Secondary | ICD-10-CM | POA: Diagnosis not present

## 2024-07-31 DIAGNOSIS — J449 Chronic obstructive pulmonary disease, unspecified: Secondary | ICD-10-CM | POA: Diagnosis present

## 2024-07-31 DIAGNOSIS — I2510 Atherosclerotic heart disease of native coronary artery without angina pectoris: Secondary | ICD-10-CM | POA: Diagnosis present

## 2024-07-31 DIAGNOSIS — R4182 Altered mental status, unspecified: Secondary | ICD-10-CM | POA: Diagnosis not present

## 2024-07-31 DIAGNOSIS — A4189 Other specified sepsis: Secondary | ICD-10-CM | POA: Diagnosis present

## 2024-07-31 LAB — GLUCOSE, CAPILLARY
Glucose-Capillary: 152 mg/dL — ABNORMAL HIGH (ref 70–99)
Glucose-Capillary: 138 mg/dL — ABNORMAL HIGH (ref 70–99)
Glucose-Capillary: 109 mg/dL — ABNORMAL HIGH (ref 70–99)
Glucose-Capillary: 107 mg/dL — ABNORMAL HIGH (ref 70–99)
Glucose-Capillary: 89 mg/dL (ref 70–99)
Glucose-Capillary: 99 mg/dL (ref 70–99)

## 2024-07-31 LAB — URINALYSIS, W/ REFLEX TO CULTURE (INFECTION SUSPECTED)
Bacteria, UA: NONE SEEN
Bilirubin Urine: NEGATIVE
Glucose, UA: NEGATIVE mg/dL
Ketones, ur: NEGATIVE mg/dL
Leukocytes,Ua: NEGATIVE
Nitrite: NEGATIVE
Protein, ur: NEGATIVE mg/dL
Specific Gravity, Urine: 1.019 (ref 1.005–1.030)
pH: 5 (ref 5.0–8.0)

## 2024-07-31 LAB — TROPONIN T, HIGH SENSITIVITY
Troponin T High Sensitivity: 24 ng/L — ABNORMAL HIGH (ref 0–19)
Troponin T High Sensitivity: 18 ng/L (ref 0–19)

## 2024-07-31 LAB — URINE DRUG SCREEN
Amphetamines: NEGATIVE
Barbiturates: NEGATIVE
Benzodiazepines: NEGATIVE
Cocaine: NEGATIVE
Fentanyl: NEGATIVE
Methadone Scn, Ur: NEGATIVE
Opiates: NEGATIVE
Tetrahydrocannabinol: NEGATIVE

## 2024-07-31 LAB — MRSA NEXT GEN BY PCR, NASAL: MRSA by PCR Next Gen: NOT DETECTED

## 2024-07-31 LAB — LACTIC ACID, PLASMA: Lactic Acid, Venous: 1.8 mmol/L (ref 0.5–1.9)

## 2024-07-31 LAB — PHOSPHORUS: Phosphorus: 3.2 mg/dL (ref 2.5–4.6)

## 2024-07-31 LAB — LIPID PANEL
Cholesterol: 113 mg/dL (ref 0–200)
HDL: 34 mg/dL — ABNORMAL LOW
LDL Cholesterol: 68 mg/dL (ref 0–99)
Total CHOL/HDL Ratio: 3.3 ratio
Triglycerides: 57 mg/dL
VLDL: 11 mg/dL (ref 0–40)

## 2024-07-31 LAB — MAGNESIUM: Magnesium: 2.1 mg/dL (ref 1.7–2.4)

## 2024-07-31 MED ORDER — METOPROLOL TARTRATE 5 MG/5ML IV SOLN: 5.0000 mg | Freq: Four times a day (QID) | INTRAVENOUS | Status: DC

## 2024-07-31 MED ORDER — LABETALOL HCL 5 MG/ML IV SOLN
10.0000 mg | INTRAVENOUS | Status: DC | PRN
  Administered 2024-07-31 – 2024-08-01 (×2): 10 mg via INTRAVENOUS
  Filled 2024-07-31 (×2): qty 4

## 2024-07-31 MED ORDER — ACETAMINOPHEN 325 MG PO TABS
650.0000 mg | ORAL_TABLET | Freq: Four times a day (QID) | ORAL | Status: DC | PRN
  Administered 2024-08-01 – 2024-08-04 (×4): 650 mg via ORAL
  Filled 2024-07-31: qty 2

## 2024-07-31 MED ORDER — ENOXAPARIN SODIUM 40 MG/0.4ML IJ SOSY
40.0000 mg | PREFILLED_SYRINGE | Freq: Every day | INTRAMUSCULAR | Status: DC
  Administered 2024-07-31 – 2024-08-01 (×2): 40 mg via SUBCUTANEOUS
  Filled 2024-07-31 (×2): qty 0.4

## 2024-07-31 MED ORDER — KETOROLAC TROMETHAMINE 15 MG/ML IJ SOLN
15.0000 mg | Freq: Once | INTRAMUSCULAR | Status: AC
  Administered 2024-07-31: 15 mg via INTRAVENOUS
  Filled 2024-07-31: qty 1

## 2024-07-31 MED ORDER — LEVETIRACETAM (KEPPRA) 500 MG/5 ML ADULT IV PUSH
500.0000 mg | Freq: Two times a day (BID) | INTRAVENOUS | Status: DC
  Administered 2024-07-31 – 2024-08-01 (×4): 500 mg via INTRAVENOUS
  Filled 2024-07-31 (×4): qty 5

## 2024-07-31 MED ORDER — INSULIN ASPART 100 UNIT/ML IJ SOLN
0.0000 [IU] | INTRAMUSCULAR | Status: DC
  Administered 2024-07-31: 3 [IU] via SUBCUTANEOUS
  Administered 2024-07-31: 2 [IU] via SUBCUTANEOUS
  Administered 2024-08-01 (×2): 3 [IU] via SUBCUTANEOUS
  Administered 2024-08-02 (×2): 2 [IU] via SUBCUTANEOUS
  Administered 2024-08-03: 3 [IU] via SUBCUTANEOUS
  Administered 2024-08-03 (×2): 2 [IU] via SUBCUTANEOUS
  Administered 2024-08-04: 3 [IU] via SUBCUTANEOUS
  Filled 2024-07-31 (×4): qty 1

## 2024-07-31 MED ORDER — LACTATED RINGERS IV SOLN: INTRAVENOUS | Status: DC

## 2024-07-31 MED ORDER — IPRATROPIUM-ALBUTEROL 0.5-2.5 (3) MG/3ML IN SOLN
RESPIRATORY_TRACT | Status: AC
  Administered 2024-07-31: 3 mL via RESPIRATORY_TRACT
  Filled 2024-07-31: qty 3

## 2024-07-31 MED ORDER — METOPROLOL TARTRATE 5 MG/5ML IV SOLN
5.0000 mg | INTRAVENOUS | Status: DC | PRN
  Administered 2024-07-31: 5 mg via INTRAVENOUS
  Filled 2024-07-31: qty 5

## 2024-07-31 MED ORDER — ACETAMINOPHEN 650 MG RE SUPP
650.0000 mg | RECTAL | Status: DC | PRN
  Administered 2024-07-31 (×3): 650 mg via RECTAL
  Filled 2024-07-31 (×3): qty 1

## 2024-07-31 MED ORDER — ENOXAPARIN SODIUM 30 MG/0.3ML IJ SOSY: 30.0000 mg | PREFILLED_SYRINGE | INTRAMUSCULAR | Status: DC

## 2024-07-31 MED ORDER — ALBUTEROL SULFATE (2.5 MG/3ML) 0.083% IN NEBU: 2.5000 mg | INHALATION_SOLUTION | RESPIRATORY_TRACT | Status: DC | PRN

## 2024-07-31 MED ORDER — CHLORHEXIDINE GLUCONATE CLOTH 2 % EX PADS
6.0000 | MEDICATED_PAD | Freq: Every day | CUTANEOUS | Status: DC
  Administered 2024-07-31 – 2024-08-04 (×5): 6 via TOPICAL

## 2024-07-31 MED ORDER — METOPROLOL TARTRATE 5 MG/5ML IV SOLN
5.0000 mg | Freq: Four times a day (QID) | INTRAVENOUS | Status: DC
  Administered 2024-07-31 – 2024-08-01 (×6): 5 mg via INTRAVENOUS
  Filled 2024-07-31 (×6): qty 5

## 2024-07-31 MED ORDER — HEPARIN (PORCINE) 25000 UT/250ML-% IV SOLN
1200.0000 [IU]/h | INTRAVENOUS | Status: DC
  Administered 2024-07-31: 1200 [IU]/h via INTRAVENOUS
  Filled 2024-07-31: qty 250

## 2024-07-31 MED ORDER — LORAZEPAM 2 MG/ML IJ SOLN: 2.0000 mg | INTRAMUSCULAR | Status: DC | PRN

## 2024-07-31 MED ORDER — IPRATROPIUM-ALBUTEROL 0.5-2.5 (3) MG/3ML IN SOLN
3.0000 mL | Freq: Three times a day (TID) | RESPIRATORY_TRACT | Status: DC
  Administered 2024-07-31 – 2024-08-04 (×14): 3 mL via RESPIRATORY_TRACT
  Filled 2024-07-31 (×6): qty 3

## 2024-07-31 NOTE — ED Notes (Signed)
 Son called ED and was updated

## 2024-07-31 NOTE — H&P (Addendum)
 " History and Physical    Patient: Robin [name] FMW:[id number] DOB: [birth year] DOA: 07/30/2024 DOS: the patient was seen and examined on 07/31/2024 PCP: Job Lukes, PA  Patient coming from: Home  Chief Complaint:  Chief Complaint  Patient presents with   Altered Mental Status   HPI: Robin [name] is a 58 y.o. 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 CKD 3. DM2. And COPD.  Acc to ER md patient came in with altered mental status and found + flu. Hospitalists consulted for admission. CT head requested given altered mentation and history. Patient unable to answer uestions bedside and no family present Sister emergency contact called, acf to her patient with aphasia at baseline, independent functioning. She reports patient is the one who called EMS due to pain.  Patient apparently known to EMS and patient not at baseline mental status Review of Systems: unable to review all systems due to the inability of the patient to answer questions. Past Medical History:  Diagnosis Date   Allergy    Anemia    Aortic stenosis    a. 02/2023 Echo: mild-mod AS.   Arthritis    Chronic abdominal pain    Chronic HFrEF (heart failure with reduced ejection fraction) (HCC)    a. EF at 30-35% by echo in 08/2018 b. EF at 35% by repeat echo in 05/2019 c. EF improved to 65-70% in 2021 d. EF at 40-45% in 08/2021; e. 02/2023 Echo: 40-45%, glob HK, mod LVH, nl RV size/fx, sev BAE, mild-mod AS.   Cocaine abuse (HCC)    COPD (chronic obstructive pulmonary disease) (HCC)    Diabetes mellitus without complication (HCC)    Essential hypertension, benign    Expressive aphasia    post CVA   Fatty liver    GERD (gastroesophageal reflux disease)    Gout 2016   Normal coronary arteries    a. 10/2008 abnl MV; b. 10/2008 Cath: nl cors.   Ovarian cyst    Permanent atrial fibrillation (HCC)    Stroke (HCC) 12/26/2019   Right  sided weakness, and expressive aphasia   Thoracic ascending aortic aneurysm    a. 4 cm 10/31/19 CTA; b. 02/2023 Echo: Nl Ao root.   Type 2 diabetes mellitus (HCC)    type II   Past Surgical History:  Procedure Laterality Date   ABDOMINAL HYSTERECTOMY  09/10/2011   Procedure: HYSTERECTOMY ABDOMINAL;  Surgeon: Norleen LULLA Server, MD;  Location: [facility];  Service: Gynecology;  Laterality: N/A;  Abdominal hysterectomy   CESAREAN SECTION  [phone], and 1994   CHOLECYSTECTOMY  1995   IR 3D INDEPENDENT WKST  03/16/2020   IR ANGIO INTRA EXTRACRAN SEL INTERNAL CAROTID BILAT MOD SED  03/16/2020   IR ANGIO VERTEBRAL SEL SUBCLAVIAN INNOMINATE UNI L MOD SED  03/16/2020   IR ANGIO VERTEBRAL SEL VERTEBRAL UNI R MOD SED  03/16/2020   IR CT HEAD LTD  12/27/2019   IR PERCUTANEOUS ART THROMBECTOMY/INFUSION INTRACRANIAL INC DIAG ANGIO  12/27/2019   IR US  GUIDE VASC ACCESS RIGHT  12/27/2019   IR US  GUIDE VASC ACCESS RIGHT  03/16/2020   RADIOLOGY WITH ANESTHESIA N/A 12/27/2019   Procedure: IR WITH ANESTHESIA;  Surgeon: Dolphus Carrion, MD;  Location: [facility];  Service: Radiology;  Laterality: N/A;   SCAR REVISION  09/10/2011   Procedure: SCAR REVISION;  Surgeon: Norleen LULLA Server, MD;  Location: [facility];  Service: Gynecology;  Laterality: N/A;  Wide  Excision of old Cicatrix   TUBAL LIGATION  1994   Social History:  reports that she quit smoking about 4 years ago. Her smoking use included cigarettes. She started smoking about 33 years ago. She has never used smokeless tobacco. She reports that she does not currently use alcohol . She reports that she does not currently use drugs.  Allergies[1]  Family History  Problem Relation Age of Onset   Cirrhosis Mother    Early death Mother    Alcohol  abuse Mother    Diabetes type II Father    Alcohol  abuse Father    Diabetes type II Sister    Early death Brother    Alcohol  abuse Son    Anesthesia problems Neg Hx    Hypotension Neg Hx    Malignant hyperthermia Neg Hx     Pseudochol deficiency Neg Hx    Colon cancer Neg Hx    Colon polyps Neg Hx    Esophageal cancer Neg Hx    Rectal cancer Neg Hx    Stomach cancer Neg Hx     Prior to Admission medications  Medication Sig Start Date End Date Taking? Authorizing Provider  Accu-Chek Softclix Lancets lancets Use as instructed 06/13/21   Shamleffer, Donell Cardinal, MD  apixaban  (ELIQUIS ) 5 MG TABS tablet Take 1 tablet (5 mg total) by mouth 2 (two) times daily. 07/05/24   Job Lukes, PA  aspirin  EC 81 MG tablet Take 1 tablet (81 mg total) by mouth daily. Swallow whole. 07/19/20   Strader, Laymon HERO, PA-C  Blood Glucose Monitoring Suppl (ACCU-CHEK GUIDE ME) w/Device KIT 1 Device by Does not apply route 3 (three) times daily. 03/09/21   Shamleffer, Ibtehal Jaralla, MD  carvedilol  (COREG ) 25 MG tablet TAKE 2 TABLETS BY MOUTH TWICE DAILY WITH A MEAL 04/07/24   Alvan Dorn FALCON, MD  Dulaglutide  (TRULICITY ) 4.5 MG/0.5ML SOAJ Inject 4.5 mg into the skin daily in the afternoon. Patient taking differently: Inject 4.5 mg into the skin once a week. 10/06/23   Shamleffer, Ibtehal Jaralla, MD  empagliflozin  (JARDIANCE ) 25 MG TABS tablet Take 1 tablet (25 mg total) by mouth daily before breakfast. 10/06/23   Shamleffer, Ibtehal Jaralla, MD  Ferrous Sulfate  (IRON) 325 (65 Fe) MG TABS Take 1 tablet by mouth once daily 07/14/24   Worley, Samantha, PA  furosemide  (LASIX ) 20 MG tablet TAKE 1 TABLET BY MOUTH ONCE DAILY (MAY TAKE ADDITIONAL 20 MG IF WEIGHT GAIN IS OVER 2 LBS OVERNIGHT OR 5 LBS IN A WEEK) 05/24/24   Alvan Dorn FALCON, MD  gabapentin  (NEURONTIN ) 100 MG capsule TAKE 2 CAPSULES BY MOUTH THREE TIMES DAILY 07/09/24   Job Lukes, PA  glucose blood (ACCU-CHEK GUIDE TEST) test strip USE 1 STRIP TO CHECK GLUCOSE THREE TIMES DAILY 12/15/23   Shamleffer, Ibtehal Jaralla, MD  hydrALAZINE  (APRESOLINE ) 25 MG tablet TAKE 1 TABLET BY MOUTH THREE TIMES DAILY 06/15/24   Job Lukes, PA  insulin  glargine (LANTUS  SOLOSTAR) 100  UNIT/ML Solostar Pen Inject 44 Units into the skin daily. Patient taking differently: Inject 44 Units into the skin at bedtime. 10/06/23   Shamleffer, Ibtehal Jaralla, MD  insulin  lispro (HUMALOG  KWIKPEN) 100 UNIT/ML KwikPen Inject 12 Units into the skin 3 (three) times daily. 01/09/24   Motwani, Komal, MD  Insulin  Pen Needle (BD PEN NEEDLE NANO U/F) 32G X 4 MM MISC Inject 1 Device into the skin in the morning, at noon, in the evening, and at bedtime. 10/06/23   Shamleffer, Ibtehal Jaralla, MD  isosorbide  mononitrate (  IMDUR ) 30 MG 24 hr tablet Take 2 tablets by mouth once daily 07/05/24   Worley, Samantha, PA  Oxcarbazepine  (TRILEPTAL ) 300 MG tablet Take 1 tablet (300 mg total) by mouth 2 (two) times daily. Patient taking differently: Take 150 mg by mouth 2 (two) times daily. 12/02/23   Camara, Amadou, MD  pantoprazole  (PROTONIX ) 40 MG tablet Take 1 tablet by mouth once daily 07/09/24   Job Lukes, [place]  timolol  (TIMOPTIC ) 0.5 % ophthalmic solution Place 1 drop into both eyes 2 (two) times daily. 07/05/22   [provider]  tobramycin  (TOBREX ) 0.3 % ophthalmic solution Place 1 drop into both eyes 4 (four) times daily. 04/16/23   [provider]    Physical Exam: Vitals:   07/30/24 2225 07/30/24 2227 07/30/24 2330 07/31/24 0115  BP:  (!) 130/97 (!) 140/73   Pulse:  (!) 114 (!) 123 (!) 111  Resp:  (!) 23 (!) 23 20  Temp:  (!) 102.3 F (39.1 C)    SpO2:  93% 94% 94%  Weight: 113.4 kg     Height: 5' 6 (1.676 m)      GENERAL/Neuro- right ey/face twitching intermittent along with teeth grinding, after that seemed to subside patient did attempt to follow command and MAE, RUE comparatively weaker as per her baseline status report HEENT- Atraumatic, normocephalic, PERRL, EOMI, oral mucosa appears moist CARDIAC- irregular, a fib on monitor 120'sno murmurs, rubs or gallops. RESP- Moving equal volumes of air, and clear to auscultation bilaterally, no wheezes or crackles. ABDOMEN- trunckal  obesity, Soft, nontender, bowel sounds present. EXTREMITIES- pulse 2+, symmetric, no pedal edema. SKIN- Warm, dry, No rash or lesion. PSYCH- Normal mood and affect, appropriate thought content and speech.  Data Reviewed: Chart review further seizure history 09/2023 CT head negative for acute abnormalities  Assessment and Plan: Acute metabolic encephalopathy Meets SIRS criteria, + flu, no PNA on imaging and UA without evidence of acute infection-has fever and tachycardia in setting of + flu and chronic atrial fib -MRI in am -EEG in am -consult neurology in am if needed -keppra  500 mg BID initially (poss back to trileptal  once swallow eval done) -treat flu - tamiflu  orally once swallow eval is completed and supportive care-no evidence of pneumonia on imaging, discontinue antibiotics  CVA -see above +  _She is s/p MCA infarct in 12/2019 and required thrombectomy with TICI3 reperfusion. She is being followed by IR for possible right ICA aneurysm coiling/stenting in the future. IR did recommend we stop Brilinta  for now but continue ASA 81mg  daily with Eliquis  - per outpatient assess 01/12/2024 -Speech eval - npo till cleared with aphasia history PT/OT   Seizures -seizure precautions -EEG and neuro consult in am -prn ativan  for seizure activity  Atrial fib -IV heparin  per pharmacy consult -resume eliquis  when able -every 6h metoprolol  for rate control -resume oral coreg  when able -cardiac monitoring  AKI on CKD 3 -renal protective strategies with drug dosing -daily renal function monitoring whil in house -conservative IV fluid therapy, monitor fluid status  DM  Q4h cbg monitoring with SSI -resume home lantus , trulicity  and jardiance  when able to   Chronic HFrEF -Dx 2020 -02/2023 echo: LVEF 40-45%, severe BAE  has not been on an ACE-I/ARB/ARNI/MRA due to her variable renal function. 09/2022 GFR was 27 - no SOB/DOE, occasional LE edema. Has prn lasix  per cards notes - daily  weights and strict I & O  CAD -home meds eliquis  5 mg bid, , aspirin81 daily, imdur  30 daily,  coreg  25 BID, furosemide  prn -metoprolol  5 mg every 6 h -trend set of trops -Monitor for tachyarrhythmia -check lipid profile, investigate statin tolerance, start if not contraindicated    Advance Care Planning: CODE status discussed with emergency contact era Bardette via phone confirmed full code status  Consults: TBD  Family Communication: sister [name] Chief Of Staff via phone  CRITICAL CARE Performed by: Erminio Cone   Total critical care time: 60 minutes  Critical care time was exclusive of separately billable procedures and treating other patients.  Critical care was necessary to treat or prevent imminent or life-threatening deterioration.  Critical care was time spent personally by me on the following activities: development of treatment plan with patient and/or surrogate as well as nursing, discussions with consultants, evaluation of patient's response to treatment, examination of patient, obtaining history from patient or surrogate, ordering and performing treatments and interventions, ordering and review of laboratory studies, ordering and review of radiographic studies, pulse oximetry and re-evaluation of patient's condition.   Severity of Illness: The appropriate patient status for this patient is INPATIENT. Inpatient status is judged to be reasonable and necessary in order to provide the required intensity of service to ensure the patient's safety. The patient's presenting symptoms, physical exam findings, and initial radiographic and laboratory data in the context of their chronic comorbidities is felt to place them at high risk for further clinical deterioration. Furthermore, it is not anticipated that the patient will be medically stable for discharge from the hospital within 2 midnights of admission.   * I certify that at the point of admission it is my clinical judgment that the  patient will require inpatient hospital care spanning beyond 2 midnights from the point of admission due to high intensity of service, high risk for further deterioration and high frequency of surveillance required.*  Author: Erminio Cone, NP 07/31/2024 1:51 AM  For on call review [email or web address].      [1]  Allergies Allergen Reactions   Bee Venom Shortness Of Breath and Swelling    Bodily Swelling   Losartan  Other (See Comments)    Nosebleeds   Naproxen  Other (See Comments)    Acid reflux   Lisinopril  Other (See Comments)    Sinus congestion   Penicillins Nausea Only   "

## 2024-07-31 NOTE — Plan of Care (Signed)
" °  Problem: Education: Goal: Knowledge of General Education information will improve Description: Including pain rating scale, medication(s)/side effects and non-pharmacologic comfort measures Outcome: Not Progressing   Problem: Activity: Goal: Risk for activity intolerance will decrease Outcome: Not Progressing   Problem: Coping: Goal: Level of anxiety will decrease Outcome: Not Progressing   Problem: Pain Managment: Goal: General experience of comfort will improve and/or be controlled Outcome: Not Progressing   Problem: Safety: Goal: Ability to remain free from injury will improve Outcome: Not Progressing   "

## 2024-07-31 NOTE — Progress Notes (Signed)
 PHARMACY - ANTICOAGULATION CONSULT NOTE  Pharmacy Consult for Heparin  (Apixaban  on hold) Indication: atrial fibrillation  Allergies[1]  Patient Measurements: Height: 5' 6 (167.6 cm) Weight: 113.4 kg (250 lb) IBW/kg (Calculated) : 59.3 HEPARIN  DW (KG): 85.9  Vital Signs: Temp: 99.2 F (37.3 C) (12/27 0401) Temp Source: Axillary (12/27 0401) BP: 174/124 (12/27 0401) Pulse Rate: 116 (12/27 0401)  Labs: Recent Labs    07/30/24 2229 07/30/24 2316  HGB 14.0  --   HCT 45.5  --   PLT 246  --   LABPROT  --  18.6*  INR  --  1.5*  CREATININE 2.00*  --     Estimated Creatinine Clearance: 39.2 mL/min (A) (by C-G formula based on SCr of 2 mg/dL (H)).   Medical History: Past Medical History:  Diagnosis Date   Allergy    Anemia    Aortic stenosis    a. 02/2023 Echo: mild-mod AS.   Arthritis    Chronic abdominal pain    Chronic HFrEF (heart failure with reduced ejection fraction) (HCC)    a. EF at 30-35% by echo in 08/2018 b. EF at 35% by repeat echo in 05/2019 c. EF improved to 65-70% in 2021 d. EF at 40-45% in 08/2021; e. 02/2023 Echo: 40-45%, glob HK, mod LVH, nl RV size/fx, sev BAE, mild-mod AS.   Cocaine abuse (HCC)    COPD (chronic obstructive pulmonary disease) (HCC)    Diabetes mellitus without complication (HCC)    Essential hypertension, benign    Expressive aphasia    post CVA   Fatty liver    GERD (gastroesophageal reflux disease)    Gout 2016   Normal coronary arteries    a. 10/2008 abnl MV; b. 10/2008 Cath: nl cors.   Ovarian cyst    Permanent atrial fibrillation (HCC)    Stroke (HCC) 12/26/2019   Right sided weakness, and expressive aphasia   Thoracic ascending aortic aneurysm    a. 4 cm 10/31/19 CTA; b. 02/2023 Echo: Nl Ao root.   Type 2 diabetes mellitus (HCC)    type II     Assessment: 58 y/o F presents to the ED with altered mental status. Holding apixaban  and starting heparin . It has been >12 hours since last apixaban  dose, will start heparin  now.  Anticipate using aPTT to dose. Above labs reviewed.   Goal of Therapy:  Heparin  level 0.3-0.7 units/ml aPTT 66-102 seconds Monitor platelets by anticoagulation protocol: Yes   Plan:  Start heparin  drip at 1200 units/hr Heparin  level and aPTT in 8 hours  Lynwood Mckusick, PharmD, BCPS Clinical Pharmacist Phone: [phone]       [1]  Allergies Allergen Reactions   Bee Venom Shortness Of Breath and Swelling    Bodily Swelling   Losartan  Other (See Comments)    Nosebleeds   Naproxen  Other (See Comments)    Acid reflux   Lisinopril  Other (See Comments)    Sinus congestion   Penicillins Nausea Only

## 2024-07-31 NOTE — Progress Notes (Addendum)
 Patient seen and evaluated, chart reviewed, please see EMR for updated orders. Please see full H&P dictated by admitting physician Dr. Adefeso for same date of service.   Brief Summary:- 58 y.o. 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 CKD 3. DM2 And COPD admitted on 07/31/2024 with acute metabolic encephalopathy in the setting of influenza A infection and AKI on CKD 3A  A/p 1) influenza A--- unable to give Tamiflu  due to altered mentation inability to take oral intake - Patient with persistent fevers despite Tylenol  suppositories - Give IV Toradol  x 1 dose - Cooling blanket  2)AKI----acute kidney injury on CKD stage -3A--due to dehydration in the setting of poor oral intake as a result of altered mentation due to flu -   creatinine on admission= 2.0 ,  baseline creatinine = 1.4-1.5   , -renally adjust medications, avoid nephrotoxic agents / dehydration  / hypotension  3) acute metabolic encephalopathy--due to #1 #2 above - Persistent fevers contributory - CT head and MRI brain without acute strokes or other acute findings - EEG pending - No leukocytosis or evidence of bacterial infection at this time -Blood cultures pending, UA not suggestive of UTI -History of polysubstance abuse including cocaine in the past - Urine tox screen added the prior UA from earlier today  4)DM2--recent A1c 8.0 reflecting uncontrolled diabetes with hyperglycemia PTA - Hold PTA Jardiance  and Trulicity  as well as Lantus  until oral intake is more reliable -Use Novolog /Humalog  Sliding scale insulin  with Accu-Cheks/Fingersticks as ordered   5) chronic systolic dysfunction CHF--echo from July 2024 with EF of 40 to 45% which is improved from echo of April 2023 when EF was 25 to 30% -- History of cocaine abuse with concerns for possible cocaine induced cardiomyopathy -avoid ACEI/ARB/ARNI due to renal concerns - Resume Coreg   and Jardiance  when oral intake resumes - Hold Lasix  - Okay to restart isosorbide /hydralazine  (BiDil ) when oral intake resumes  6) seizure disorder-hold-PTA Trileptal  - Give IV Keppra  for now  7)PAFib--unable to give oral Coreg  so use IV labetalol  for rate control - Subcu Lovenox  for now until oral intake resumed at which point we can switch back to Eliquis   Social/Ethics--plan of care discussed with patient's boyfriend of 7 years [name] at bedside, questions answered -- Patient's daughter apparently visited earlier today also - Patient remains a full code  Total care time 57 minutes  Patient seen and evaluated, chart reviewed, please see EMR for updated orders. Please see full H&P dictated by admitting physician Dr. Adefeso for same date of service.

## 2024-07-31 NOTE — Plan of Care (Signed)
 ?  Problem: Clinical Measurements: ?Goal: Respiratory complications will improve ?Outcome: Progressing ?  ?Problem: Elimination: ?Goal: Will not experience complications related to urinary retention ?Outcome: Progressing ?  ?Problem: Safety: ?Goal: Ability to remain free from injury will improve ?Outcome: Progressing ?  ?

## 2024-07-31 NOTE — Progress Notes (Signed)
 RT called to assess patient. O2 sat 95% on room air, RR: 25-38, hr: 117-130's, BP elevated. RN reports she just gave medication for BP and HR. No respiratory orders are in for patient so this RT performed RT protocol assessment. Patient scored a 10 which qualifies for TID and PRN neb treatments. Patient currently in afib but RT unable to give or order anything other than Duoneb/Albuterol . Patient is not a good historian and is unable to answer questions or provide history at this time. Hx of tobacco abuse in chart. Patient is also Flu A+.

## 2024-07-31 NOTE — Progress Notes (Signed)
" °   07/31/24 1126  Vitals  Temp (!) 102 F (38.9 C)  Temp Source Axillary  BP (!) 188/104  MAP (mmHg) 129  Pulse Rate (!) 134  ECG Heart Rate (!) 123  Resp (!) 28  MEWS COLOR  MEWS Score Color Red  Oxygen  Therapy  SpO2 (!) 89 %  O2 Device Room Air  Pain Assessment  Pain Scale CPOT  Critical Care Pain Observation Tool (CPOT)  Facial Expression 0  Body Movements 0  Muscle Tension 0  Compliance with ventilator (intubated pts.) N/A  Vocalization (extubated pts.) 0  CPOT Total 0  MEWS Score  MEWS Temp 2  MEWS Systolic 0  MEWS Pulse 2  MEWS RR 2  MEWS LOC 2  MEWS Score 8    Dr. Valli notified "

## 2024-08-01 DIAGNOSIS — G9341 Metabolic encephalopathy: Secondary | ICD-10-CM | POA: Diagnosis not present

## 2024-08-01 LAB — GLUCOSE, CAPILLARY
Glucose-Capillary: 104 mg/dL — ABNORMAL HIGH (ref 70–99)
Glucose-Capillary: 110 mg/dL — ABNORMAL HIGH (ref 70–99)
Glucose-Capillary: 150 mg/dL — ABNORMAL HIGH (ref 70–99)
Glucose-Capillary: 151 mg/dL — ABNORMAL HIGH (ref 70–99)
Glucose-Capillary: 161 mg/dL — ABNORMAL HIGH (ref 70–99)
Glucose-Capillary: 93 mg/dL (ref 70–99)

## 2024-08-01 MED ORDER — GABAPENTIN 100 MG PO CAPS
200.0000 mg | ORAL_CAPSULE | Freq: Two times a day (BID) | ORAL | Status: DC
  Administered 2024-08-01 – 2024-08-04 (×6): 200 mg via ORAL
  Filled 2024-08-01: qty 2

## 2024-08-01 MED ORDER — SODIUM CHLORIDE 0.9 % IV SOLN: INTRAVENOUS | Status: DC

## 2024-08-01 MED ORDER — ATORVASTATIN CALCIUM 20 MG PO TABS
20.0000 mg | ORAL_TABLET | Freq: Every day | ORAL | Status: DC
  Administered 2024-08-01 – 2024-08-04 (×4): 20 mg via ORAL
  Filled 2024-08-01: qty 1

## 2024-08-01 MED ORDER — OSELTAMIVIR PHOSPHATE 30 MG PO CAPS
30.0000 mg | ORAL_CAPSULE | Freq: Two times a day (BID) | ORAL | Status: DC
  Administered 2024-08-01 – 2024-08-04 (×6): 30 mg via ORAL
  Filled 2024-08-01: qty 1

## 2024-08-01 MED ORDER — ORAL CARE MOUTH RINSE
15.0000 mL | OROMUCOSAL | Status: DC
  Administered 2024-08-01 – 2024-08-04 (×13): 15 mL via OROMUCOSAL

## 2024-08-01 MED ORDER — OXCARBAZEPINE 150 MG PO TABS
150.0000 mg | ORAL_TABLET | Freq: Two times a day (BID) | ORAL | Status: DC
  Administered 2024-08-01 – 2024-08-04 (×6): 150 mg via ORAL
  Filled 2024-08-01: qty 1

## 2024-08-01 MED ORDER — APIXABAN 5 MG PO TABS
5.0000 mg | ORAL_TABLET | Freq: Two times a day (BID) | ORAL | Status: DC
  Administered 2024-08-01 – 2024-08-04 (×6): 5 mg via ORAL
  Filled 2024-08-01: qty 1

## 2024-08-01 MED ORDER — CARVEDILOL 12.5 MG PO TABS
12.5000 mg | ORAL_TABLET | Freq: Two times a day (BID) | ORAL | Status: DC
  Administered 2024-08-01 – 2024-08-04 (×6): 12.5 mg via ORAL
  Filled 2024-08-01: qty 1

## 2024-08-01 MED ORDER — PANTOPRAZOLE SODIUM 40 MG PO TBEC
40.0000 mg | DELAYED_RELEASE_TABLET | Freq: Every day | ORAL | Status: DC
  Administered 2024-08-01 – 2024-08-04 (×4): 40 mg via ORAL
  Filled 2024-08-01: qty 1

## 2024-08-01 MED ORDER — ORAL CARE MOUTH RINSE: 15.0000 mL | OROMUCOSAL | Status: DC | PRN

## 2024-08-01 MED ORDER — SODIUM CHLORIDE 0.9 % IV SOLN: INTRAVENOUS | Status: AC

## 2024-08-01 MED ORDER — ISOSORBIDE MONONITRATE ER 30 MG PO TB24
30.0000 mg | ORAL_TABLET | Freq: Every day | ORAL | Status: DC
  Administered 2024-08-01 – 2024-08-04 (×4): 30 mg via ORAL
  Filled 2024-08-01: qty 1

## 2024-08-01 MED ORDER — FERROUS SULFATE 325 (65 FE) MG PO TABS
325.0000 mg | ORAL_TABLET | Freq: Every day | ORAL | Status: DC
  Administered 2024-08-02 – 2024-08-04 (×3): 325 mg via ORAL

## 2024-08-01 MED ORDER — HYDRALAZINE HCL 25 MG PO TABS
25.0000 mg | ORAL_TABLET | Freq: Three times a day (TID) | ORAL | Status: DC
  Administered 2024-08-01 – 2024-08-04 (×9): 25 mg via ORAL
  Filled 2024-08-01 (×2): qty 1

## 2024-08-01 NOTE — Evaluation (Signed)
 Clinical/Bedside Swallow Evaluation Patient Details  Name: Robin [name] MRN: [id number] Date of Birth: 04-21-1966  Today's Date: 08/01/2024 Time: SLP Start Time (ACUTE ONLY): 1532 SLP Stop Time (ACUTE ONLY): 1551 SLP Time Calculation (min) (ACUTE ONLY): 19 min  Past Medical History:  Past Medical History:  Diagnosis Date   Allergy    Anemia    Aortic stenosis    a. 02/2023 Echo: mild-mod AS.   Arthritis    Chronic abdominal pain    Chronic HFrEF (heart failure with reduced ejection fraction) (HCC)    a. EF at 30-35% by echo in 08/2018 b. EF at 35% by repeat echo in 05/2019 c. EF improved to 65-70% in 2021 d. EF at 40-45% in 08/2021; e. 02/2023 Echo: 40-45%, glob HK, mod LVH, nl RV size/fx, sev BAE, mild-mod AS.   Cocaine abuse (HCC)    COPD (chronic obstructive pulmonary disease) (HCC)    Diabetes mellitus without complication (HCC)    Essential hypertension, benign    Expressive aphasia    post CVA   Fatty liver    GERD (gastroesophageal reflux disease)    Gout 2016   Normal coronary arteries    a. 10/2008 abnl MV; b. 10/2008 Cath: nl cors.   Ovarian cyst    Permanent atrial fibrillation (HCC)    Stroke (HCC) 12/26/2019   Right sided weakness, and expressive aphasia   Thoracic ascending aortic aneurysm    a. 4 cm 10/31/19 CTA; b. 02/2023 Echo: Nl Ao root.   Type 2 diabetes mellitus (HCC)    type II   Past Surgical History:  Past Surgical History:  Procedure Laterality Date   ABDOMINAL HYSTERECTOMY  09/10/2011   Procedure: HYSTERECTOMY ABDOMINAL;  Surgeon: Norleen LULLA Server, MD;  Location: [facility];  Service: Gynecology;  Laterality: N/A;  Abdominal hysterectomy   CESAREAN SECTION  [phone], and 1994   CHOLECYSTECTOMY  1995   IR 3D INDEPENDENT WKST  03/16/2020   IR ANGIO INTRA EXTRACRAN SEL INTERNAL CAROTID BILAT MOD SED  03/16/2020   IR ANGIO VERTEBRAL SEL SUBCLAVIAN INNOMINATE UNI L MOD SED  03/16/2020   IR ANGIO VERTEBRAL SEL VERTEBRAL UNI R MOD SED  03/16/2020   IR CT HEAD  LTD  12/27/2019   IR PERCUTANEOUS ART THROMBECTOMY/INFUSION INTRACRANIAL INC DIAG ANGIO  12/27/2019   IR US  GUIDE VASC ACCESS RIGHT  12/27/2019   IR US  GUIDE VASC ACCESS RIGHT  03/16/2020   RADIOLOGY WITH ANESTHESIA N/A 12/27/2019   Procedure: IR WITH ANESTHESIA;  Surgeon: Dolphus Carrion, MD;  Location: [facility];  Service: Radiology;  Laterality: N/A;   SCAR REVISION  09/10/2011   Procedure: SCAR REVISION;  Surgeon: Norleen LULLA Server, MD;  Location: [facility];  Service: Gynecology;  Laterality: N/A;  Wide Excision of old Cicatrix   TUBAL LIGATION  1994   HPI:  58 y.o. 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 CKD 3. DM2 And COPD admitted on 07/31/2024 with acute metabolic encephalopathy in the setting of influenza A infection and AKI on CKD. Pt has aphasia and apraxia from previous stroke and she currently uses total communication strategies to communicate, including the Lingraphica. BSE requested    Assessment / Plan / Recommendation  Clinical Impression  Clinical swallowing evaluation completed while Pt was sitting upright in bed with [name] significant other seated at bedside during evaluation. Pt reportedly eats whatever she wants at home. Pt consumed all textures and consistencies without overt  s/sx of aspiration. Recommend downgrade [name] diet to D3/mech soft and continue with thin liquids. Recommend downgrade to d3 d/t Pt preference. Standard aspiration precautions apply. Pt with long hx of ST for cognition and aphasia however no further need for interventions for dysphagia at this time. Our service will sign off. Thank you, SLP Visit Diagnosis: Dysphagia, unspecified (R13.10)    Aspiration Risk  Mild aspiration risk    Diet Recommendation    D3/ thin liquids       Other Recommendations Oral Care Recommendations: Oral care BID     Swallow Evaluation Recommendations Recommendations: PO diet PO Diet  Recommendation: Dysphagia 3 (Mechanical soft);Thin liquids (Level 0) Liquid Administration via: Cup;Straw Medication Administration: Whole meds with liquid Supervision: Patient able to self-feed;Staff to assist with self-feeding;Full assist for feeding Swallowing strategies  : Slow rate;Small bites/sips Postural changes: Position pt fully upright for meals Oral care recommendations: Oral care BID (2x/day)     Swallow Study   General Date of Onset: 07/30/24 HPI: 58 y.o. 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 CKD 3. DM2 And COPD admitted on 07/31/2024 with acute metabolic encephalopathy in the setting of influenza A infection and AKI on CKD. Pt has aphasia and apraxia from previous stroke and she currently uses total communication strategies to communicate, including the Lingraphica. BSE requested Type of Study: Bedside Swallow Evaluation Previous Swallow Assessment: Seen previously for aphasia and cognition Diet Prior to this Study: Regular;Thin liquids (Level 0) Temperature Spikes Noted: No Respiratory Status: Room air History of Recent Intubation: No Behavior/Cognition: Alert;Cooperative;Pleasant mood Oral Cavity Assessment: Within Functional Limits Oral Care Completed by SLP: Recent completion by staff Oral Cavity - Dentition: Adequate natural dentition Vision: Functional for self-feeding Self-Feeding Abilities: Able to feed self Patient Positioning: Upright in bed Baseline Vocal Quality: Normal Volitional Cough: Strong Volitional Swallow: Able to elicit    Oral/Motor/Sensory Function Overall Oral Motor/Sensory Function: Within functional limits   Ice Chips Ice chips: Within functional limits   Thin Liquid Thin Liquid: Within functional limits    Nectar Thick Nectar Thick Liquid: Not tested   Honey Thick Honey Thick Liquid: Not tested   Puree Puree: Within functional limits    Solid     Solid: Within functional limits     Robin [name] H. Clois KILLIAN, CCC-SLP Speech Language Pathologist  Robin [name] Clois 08/01/2024,3:52 PM

## 2024-08-01 NOTE — Progress Notes (Signed)
 " PROGRESS NOTE  Robin [name], is a 58 y.o. female, DOB - 1966-04-25, FMW:[id number]  Admit date - 07/30/2024   Admitting Physician Robin Maier, DO  Outpatient Primary MD for the patient is Robin [name], [place]  LOS - 1  Chief Complaint  Patient presents with   Altered Mental Status       Brief Summary:- 58 y.o. 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 CKD 3. DM2 And COPD admitted on 07/31/2024 with acute metabolic encephalopathy in the setting of influenza A infection and AKI on CKD 3A   A/p 1) influenza A--- mentation has improved  --okay to give Tamiflu  --- -No further persistent fevers - Weaned off cooling blanket   2)AKI----acute kidney injury on CKD stage -3A--due to dehydration in the setting of poor oral intake as a result of altered mentation due to flu -   creatinine on admission= 2.0 ,  baseline creatinine = 1.4-1.5   , -renally adjust medications, avoid nephrotoxic agents / dehydration  / hypotension   3) acute metabolic encephalopathy--due to #1 #2 above - Persistent fevers contributory - CT head and MRI brain without acute strokes or other acute findings - EEG pending - No leukocytosis or evidence of bacterial infection at this time -Blood cultures NGTD, UA not suggestive of UTI - Remote history of polysubstance abuse including cocaine in the past - Urine tox screen negative this admission   4)DM2--recent A1c 8.0 reflecting uncontrolled diabetes with hyperglycemia PTA - Hold PTA Jardiance  and Trulicity  as well as Lantus    -Use Novolog /Humalog  Sliding scale insulin  with Accu-Cheks/Fingersticks as ordered    5) chronic systolic dysfunction CHF--echo from July 2024 with EF of 40 to 45% which is improved from echo of April 2023 when EF was 25 to 30% -- History of cocaine abuse with concerns for possible cocaine induced cardiomyopathy -avoid ACEI/ARB/ARNI due to renal  concerns - Resume Coreg  and isosorbide /hydralazine  combo -- Hold Lasix    6)Seizure disorder-- - Okay to stop IV Keppra  and restart PTA Trileptal  and gabapentin  now that patient is able to take oral intake   7)PAFib--Coreg  for rate control and Eliquis  for secondary stroke prophylaxis  8)H/o Prior Stroke with Rt Sided Residual Hemiparesis--at baseline patient lives alone with aide services during the day, she is left-hand dominant but she ambulates around the house with a cane -- Get PT OT eval - Apixaban  as above in #7, and atorvastatin  for secondary stroke prevention LDL 68--Even if her lipid panel is within desired limits, patient should still take atorvastatin  for it's Pleiotropic effects (beyond cholesterol lowering benefits)  9)Social/Ethics--plan of care discussed with patient's boyfriend of 7 years Robin [name] at bedside, questions answered -- Patient's daughter previously updated -[name] sister -Robin [name]---is Primary Contact/HCPOA--I called and updated patient's sister on 08/01/2024 - Patient remains a full code  10)Morbid Obesity- -Low calorie diet, portion control, especially due to residual hemiparesis which limits ability to be physically active discussed with patient -Body mass index is 40.14 kg/m.  11)HTN--stage II, resume carvedilol , isosorbide /azelastine combo  Status is: Inpatient   Disposition: The patient is from: Home              Anticipated d/c is to: Home with [facility]              Anticipated d/c date is: 1 day              Patient currently is not medically stable to d/c.  Barriers: Not Clinically Stable-   Code Status : -  Code Status: Full Code   Family Communication:   plan of care discussed with patient's boyfriend of 7 years Robin [name] at bedside [name] sister -Robin [name]---is Primary Contact/HCPOA--I called and updated patient's sister on 08/01/2024  DVT Prophylaxis  :   - SCDs   apixaban  (ELIQUIS ) tablet 5 mg   Lab Results  Component Value Date   PLT 246  07/30/2024    Inpatient Medications  Scheduled Meds:  apixaban   5 mg Oral BID   carvedilol   12.5 mg Oral BID WC   Chlorhexidine  Gluconate Cloth  6 each Topical Q0600   [START ON 08/02/2024] ferrous sulfate   325 mg Oral Q breakfast   gabapentin   200 mg Oral BID   hydrALAZINE   25 mg Oral TID   insulin  aspart  0-15 Units Subcutaneous Q4H   ipratropium-albuterol   3 mL Nebulization TID   isosorbide  mononitrate  30 mg Oral Daily   levETIRAcetam   500 mg Intravenous Q12H   mouth rinse  15 mL Mouth Rinse 4 times per day   pantoprazole   40 mg Oral Daily   Continuous Infusions:  sodium chloride  83 mL/hr at 08/01/24 0855   PRN Meds:.acetaminophen , acetaminophen , albuterol , labetalol , LORazepam , mouth rinse   Anti-infectives (From admission, onward)    Start     Dose/Rate Route Frequency Ordered Stop   07/31/24 1000  oseltamivir  (TAMIFLU ) capsule 30 mg  Status:  Discontinued        30 mg Oral Daily 07/30/24 2354 08/01/24 1121   07/30/24 2245  cefTRIAXone  (ROCEPHIN ) 2 g in sodium chloride  0.9 % 100 mL IVPB        2 g 200 mL/hr over 30 Minutes Intravenous Once 07/30/24 2240 07/30/24 2330   07/30/24 2245  azithromycin  (ZITHROMAX ) 500 mg in sodium chloride  0.9 % 250 mL IVPB        500 mg 250 mL/hr over 60 Minutes Intravenous  Once 07/30/24 2240 07/31/24 0030       Subjective: Robin [name] today has no further fevers, no emesis,  No chest pain,    - More awake, alert - Expressive aphasia noted - No longer requiring cooling blanket -[name] sister -Robin [name]---is Primary Contact/HCPOA--I called and updated patient's sister on 08/01/2024  Objective: Vitals:   08/01/24 1230 08/01/24 1349 08/01/24 1400 08/01/24 1500  BP: (!) 148/72  (!) 152/99 (!) 162/104  Pulse: (!) 104  99 (!) 106  Resp: (!) 23  (!) 28 20  Temp:      TempSrc:      SpO2: 95% 94% 94% 95%  Weight:      Height:        Intake/Output Summary (Last 24 hours) at 08/01/2024 1613 Last data filed at 08/01/2024  1428 Gross per 24 hour  Intake 360 ml  Output 1450 ml  Net -1090 ml   Filed Weights   07/30/24 2225 07/31/24 0237 08/01/24 0447  Weight: 113.4 kg 113.4 kg 112.8 kg    Physical Exam Gen:- Awake Alert, morbidly obese, in no apparent distress, expressive aphasia HEENT:- [place].AT, No sclera icterus Neck-Supple Neck,No JVD,.  Lungs-  CTAB , fair symmetrical air movement CV- S1, S2 normal, regular  Abd-  +ve B.Sounds, Abd Soft, No tenderness, increased truncal adiposity Extremity/Skin:- No  edema, pedal pulses present  Psych-affect is appropriate, oriented x3 Neuro-residual right-sided hemiparesis, residual expressive aphasia, no additional new focal deficits, no tremors,, patient is left-hand dominant at baseline  Data Reviewed: I have personally  reviewed following labs and imaging studies  CBC: Recent Labs  Lab 07/30/24 2229  WBC 7.6  NEUTROABS 5.8  HGB 14.0  HCT 45.5  MCV 88.0  PLT 246   Basic Metabolic Panel: Recent Labs  Lab 07/30/24 2229 07/31/24 0050  NA 142  --   K 4.2  --   CL 104  --   CO2 23  --   GLUCOSE 109*  --   BUN 25*  --   CREATININE 2.00*  --   CALCIUM  9.2  --   MG  --  2.1  PHOS  --  3.2   GFR: Estimated Creatinine Clearance: 39.1 mL/min (A) (by C-G formula based on SCr of 2 mg/dL (H)). Liver Function Tests: Recent Labs  Lab 07/30/24 2229  AST 21  ALT 11  ALKPHOS 103  BILITOT 0.4  PROT 7.4  ALBUMIN 4.3   Recent Results (from the past 240 hours)  Culture, blood (Routine x 2)     Status: None (Preliminary result)   Collection Time: 07/30/24 10:29 PM   Specimen: BLOOD LEFT FOREARM  Result Value Ref Range Status   Specimen Description BLOOD LEFT FOREARM  Final   Special Requests   Final    BOTTLES DRAWN AEROBIC AND ANAEROBIC Blood Culture results may not be optimal due to an inadequate volume of blood received in culture bottles   Culture   Final    NO GROWTH 2 DAYS Performed at [facility], [address] [place], [name] [address]     Report Status PENDING  Incomplete  Resp panel by RT-PCR (RSV, Flu A&B, Covid) Anterior Nasal Swab     Status: Abnormal   Collection Time: 07/30/24 10:29 PM   Specimen: Anterior Nasal Swab  Result Value Ref Range Status   SARS Coronavirus 2 by RT PCR NEGATIVE NEGATIVE Final    Comment: (NOTE) SARS-CoV-2 target nucleic acids are NOT DETECTED.  The SARS-CoV-2 RNA is generally detectable in upper respiratory specimens during the acute phase of infection. The lowest concentration of SARS-CoV-2 viral copies this assay can detect is 138 copies/mL. A negative result does not preclude SARS-Cov-2 infection and should not be used as the sole basis for treatment or other patient management decisions. A negative result may occur with  improper specimen collection/handling, submission of specimen other than nasopharyngeal swab, presence of viral mutation(s) within the areas targeted by this assay, and inadequate number of viral copies(<138 copies/mL). A negative result must be combined with clinical observations, patient history, and epidemiological information. The expected result is Negative.  Fact Sheet for Patients:  bloggercourse.com  Fact Sheet for Healthcare Providers:  seriousbroker.it  This test is no t yet approved or cleared by the [place]  FDA and  has been authorized for detection and/or diagnosis of SARS-CoV-2 by FDA under an Emergency Use Authorization (EUA). This EUA will remain  in effect (meaning this test can be used) for the duration of the COVID-19 declaration under Section 564(b)(1) of the Act, 21 U.S.C.section 360bbb-3(b)(1), unless the authorization is terminated  or revoked sooner.       Influenza A by PCR POSITIVE (A) NEGATIVE Final   Influenza B by PCR NEGATIVE NEGATIVE Final    Comment: (NOTE) The Xpert Xpress SARS-CoV-2/FLU/RSV plus assay is intended as an aid in the diagnosis of influenza from  Nasopharyngeal swab specimens and should not be used as a sole basis for treatment. Nasal washings and aspirates are unacceptable for Xpert Xpress SARS-CoV-2/FLU/RSV testing.  Fact Sheet for Patients:  bloggercourse.com  Fact Sheet for Healthcare Providers: seriousbroker.it  This test is not yet approved or cleared by the [place]  FDA and has been authorized for detection and/or diagnosis of SARS-CoV-2 by FDA under an Emergency Use Authorization (EUA). This EUA will remain in effect (meaning this test can be used) for the duration of the COVID-19 declaration under Section 564(b)(1) of the Act, 21 U.S.C. section 360bbb-3(b)(1), unless the authorization is terminated or revoked.     Resp Syncytial Virus by PCR NEGATIVE NEGATIVE Final    Comment: (NOTE) Fact Sheet for Patients: bloggercourse.com  Fact Sheet for Healthcare Providers: seriousbroker.it  This test is not yet approved or cleared by the [place]  FDA and has been authorized for detection and/or diagnosis of SARS-CoV-2 by FDA under an Emergency Use Authorization (EUA). This EUA will remain in effect (meaning this test can be used) for the duration of the COVID-19 declaration under Section 564(b)(1) of the Act, 21 U.S.C. section 360bbb-3(b)(1), unless the authorization is terminated or revoked.  Performed at [facility], [address], [place], [name] [address]   Culture, blood (Routine x 2)     Status: None (Preliminary result)   Collection Time: 07/30/24 10:43 PM   Specimen: BLOOD RIGHT HAND  Result Value Ref Range Status   Specimen Description BLOOD RIGHT HAND  Final   Special Requests   Final    BOTTLES DRAWN AEROBIC AND ANAEROBIC Blood Culture results may not be optimal due to an inadequate volume of blood received in culture bottles   Culture   Final    NO GROWTH 2 DAYS Performed at [facility], [address] [place], [name] [address]    Report Status PENDING  Incomplete  MRSA Next Gen by PCR, Nasal     Status: None   Collection Time: 07/31/24  2:28 AM   Specimen: Nasal Mucosa; Nasal Swab  Result Value Ref Range Status   MRSA by PCR Next Gen NOT DETECTED NOT DETECTED Final    Comment: (NOTE) The GeneXpert MRSA Assay (FDA approved for NASAL specimens only), is one component of a comprehensive MRSA colonization surveillance program. It is not intended to diagnose MRSA infection nor to guide or monitor treatment for MRSA infections. Test performance is not FDA approved in patients less than 61 years old. Performed at [facility], [address] [place], [name] [address]    Radiology Studies: MR BRAIN WO CONTRAST Result Date: 07/31/2024 EXAM: MRI BRAIN WITHOUT CONTRAST 07/31/2024 11:14:45 AM TECHNIQUE: Multiplanar multisequence MRI of the head/brain was performed without the administration of intravenous contrast. COMPARISON: Same day CT head and MRI brain 09/07/2023. CLINICAL HISTORY: Mental status change, unknown cause. FINDINGS: BRAIN AND VENTRICLES: No acute infarct. No acute intracranial hemorrhage. No mass. No midline shift. No hydrocephalus. The sella is unremarkable. Normal flow voids. There is encephalomalacia within the left MCA territory, particularly involving the anterolateral left frontal lobe, the anterior aspect of the left superior temporal gyrus, and the left insula. Associated gliosis is present. These findings are compatible with a remote left MCA territory infarct. Similar appearance of chronic blood products is noted along the region of this remote infarct. There is a focal outpouching along the distal left M1 segment which may reflect a left MCA bifurcation aneurysm (series 10, image 14). Associated ex vacuo dilatation of the left lateral ventricle is present. A similar small remote cortical infarct involves the left postcentral gyrus. ORBITS: No acute  abnormality. SINUSES AND MASTOIDS: No acute abnormality. BONES AND SOFT TISSUES:  Normal marrow signal. No acute soft tissue abnormality. IMPRESSION: 1. No acute findings. 2. Remote left MCA territory infarct with associated encephalomalacia, gliosis, and chronic blood products. 3. Small remote cortical infarct involving the left postcentral gyrus. 4. Focal outpouching along the distal left M1 segment, possibly representing a left MCA bifurcation aneurysm. Electronically signed by: Donnice Mania MD 07/31/2024 11:52 AM EST RP Workstation: [id number]   CT Head Wo Contrast Result Date: 07/31/2024 EXAM: CT HEAD WITHOUT CONTRAST 07/31/2024 01:11:29 AM TECHNIQUE: CT of the head was performed without the administration of intravenous contrast. Automated exposure control, iterative reconstruction, and/or weight based adjustment of the mA/kV was utilized to reduce the radiation dose to as low as reasonably achievable. COMPARISON: 09/24/2023 CT head CLINICAL HISTORY: Mental status change, unknown cause FINDINGS: BRAIN AND VENTRICLES: No acute hemorrhage. No evidence of acute infarct. Unchanged remote left frontal infarct. No hydrocephalus. No extra-axial collection. No mass effect or midline shift. ORBITS: No acute abnormality. SINUSES: No acute abnormality. SOFT TISSUES AND SKULL: No acute soft tissue abnormality. No skull fracture. IMPRESSION: 1. No acute intracranial abnormality. 2. Unchanged remote left frontal infarct. Electronically signed by: Gilmore Molt 07/31/2024 01:22 AM EST RP Workstation: [id number]   DG Chest Port 1 View if patient is in a treatment room. Result Date: 07/30/2024 EXAM: 1 VIEW XRAY OF THE CHEST 07/30/2024 10:42:22 PM COMPARISON: Chest X-ray dated 09/23/2023 and CT Chest dated 09/23/2023. CLINICAL HISTORY: Suspected Sepsis FINDINGS: LUNGS AND PLEURA: No focal pulmonary opacity. No pleural effusion. No pneumothorax. HEART AND MEDIASTINUM: Cardiomegaly. Prominent hilar vasculature. The  mediastinal silhouette is unchanged compared to the prior study. BONES AND SOFT TISSUES: No acute osseous abnormality. IMPRESSION: 1. No acute cardiopulmonary abnormality. Electronically signed by: Morgane Naveau MD 07/30/2024 11:21 PM EST RP Workstation: [id number]   Scheduled Meds:  apixaban   5 mg Oral BID   carvedilol   12.5 mg Oral BID WC   Chlorhexidine  Gluconate Cloth  6 each Topical Q0600   [START ON 08/02/2024] ferrous sulfate   325 mg Oral Q breakfast   gabapentin   200 mg Oral BID   hydrALAZINE   25 mg Oral TID   insulin  aspart  0-15 Units Subcutaneous Q4H   ipratropium-albuterol   3 mL Nebulization TID   isosorbide  mononitrate  30 mg Oral Daily   levETIRAcetam   500 mg Intravenous Q12H   mouth rinse  15 mL Mouth Rinse 4 times per day   pantoprazole   40 mg Oral Daily   Continuous Infusions:  sodium chloride  83 mL/hr at 08/01/24 0855    LOS: 1 day   Rendall Carwin M.D on 08/01/2024 at 4:13 PM  Go to [email or web address] - for contact info  [facility] - Office  [phone]  If 7PM-7AM, please contact night-coverage [email or web address] 08/01/2024, 4:13 PM    "

## 2024-08-01 NOTE — TOC Initial Note (Signed)
 Transition of Care [facility]) - Initial/Assessment Note    Patient Details  Name: Robin [name] MRN: [id number] Date of Birth: 1966/04/17  Transition of Care [facility]) CM/SW Contact:    Lucie Lunger, LCSWA Phone Number: 08/01/2024, 1:47 PM  Clinical Narrative:                 Pt is high risk for readmission. CSW spoke with pts sister to complete assessment. Pt lives alone but has caregivers in the home from 9-1 7 days a week and then also M-F from 4-9. Pt has a cane and walker to use when needed. Pts sister is able to provide transportation when needed. TOC to follow.   Expected Discharge Plan: Home/Self Care Barriers to Discharge: Continued Medical Work up   Patient Goals and CMS Choice Patient states their goals for this hospitalization and ongoing recovery are:: return home CMS Medicare.gov Compare Post Acute Care list provided to:: Patient Represenative (must comment) Choice offered to / list presented to : Patient, Sibling      Expected Discharge Plan and Services In-house Referral: Clinical Social Work Discharge Planning Services: CM Consult Post Acute Care Choice: Resumption of Svcs/PTA Provider Living arrangements for the past 2 months: Apartment                                      Prior Living Arrangements/Services Living arrangements for the past 2 months: Apartment Lives with:: Self Patient language and need for interpreter reviewed:: Yes Do you feel safe going back to the place where you live?: Yes      Need for Family Participation in Patient Care: Yes (Comment) Care giver support system in place?: Yes (comment) Current home services: DME Criminal Activity/Legal Involvement Pertinent to Current Situation/Hospitalization: No - Comment as needed  Activities of Daily Living      Permission Sought/Granted                  Emotional Assessment Appearance:: Appears stated age       Alcohol  / Substance Use: Not Applicable Psych Involvement: No  (comment)  Admission diagnosis:  Influenza A [J10.1] Acute metabolic encephalopathy [G93.41] Patient Active Problem List   Diagnosis Date Noted   Type 2 diabetes mellitus with chronic kidney disease, with long-term current use of insulin  (HCC) 09/24/2023   Diabetic neuropathy (HCC) 09/24/2023   Seizure disorder (HCC) 09/24/2023   Stage 3b chronic kidney disease (CKD) (HCC) 11/27/2021   Type 2 diabetes mellitus with stage 3a chronic kidney disease, with long-term current use of insulin  (HCC) 03/09/2021   Acute metabolic encephalopathy 12/31/2020   Prolonged QT interval 05/12/2020   History of CVA with residual deficit 05/12/2020   Atrial fibrillation, chronic (HCC) 05/12/2020   Right hemiparesis (HCC)    Essential hypertension    Chronic diastolic congestive heart failure (HCC)    Dysphagia due to recent stroke 01/05/2020   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 01/05/2020   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 12/27/2019   Thoracic aortic aneurysm without rupture    Type 2 diabetes mellitus with diabetic autonomic neuropathy, with long-term current use of insulin  (HCC) 09/01/2018   Cocaine abuse (HCC) 08/25/2018   Cigarette nicotine dependence, uncomplicated 06/07/2015   GERD (gastroesophageal reflux disease) 06/07/2011   Arthritis 06/07/2011   PCP:  Job Lucie, PA Pharmacy:   [id number] - [place], [place] - [address]  [address] [place] [address] Phone: [phone] Fax: [phone]  [facility] [place] (New Address) - [place], [place] - [address] AT Previously: Viviana Mulligan, [place] [address] [place] [address] Phone: [phone] Fax: [phone]  [place] [facility] [address], [address] [name] [address] Phone: [phone] Fax: [phone]     Social Drivers of Health (SDOH) Social  History: SDOH Screenings   Food Insecurity: Patient Unable To Answer (07/31/2024)  Housing: Patient Unable To Answer (07/31/2024)  Transportation Needs: Patient Unable To Answer (07/31/2024)  Utilities: Patient Unable To Answer (07/31/2024)  Alcohol  Screen: Low Risk (12/10/2023)  Depression (PHQ2-9): Low Risk (12/10/2023)  Financial Resource Strain: Low Risk (12/10/2023)  Physical Activity: Sufficiently Active (12/10/2023)  Social Connections: Moderately Isolated (12/10/2023)  Stress: No Stress Concern Present (12/10/2023)  Tobacco Use: Medium Risk (07/30/2024)  Health Literacy: Adequate Health Literacy (12/10/2023)  Recent Concern: Health Literacy - Medium Risk (09/18/2023)   [facility] from [facility]   SDOH Interventions:     Readmission Risk Interventions    08/01/2024    1:45 PM 09/26/2023   10:11 AM 09/24/2023    2:14 PM  Readmission Risk Prevention Plan  Transportation Screening Complete Complete Complete  HRI or Home Care Consult Complete    Social Work Consult for Recovery Care Planning/Counseling Complete    Palliative Care Screening Not Applicable    Medication Review Oceanographer) Complete Complete Complete  HRI or Home Care Consult  Complete Complete  SW Recovery Care/Counseling Consult  Complete Complete  Palliative Care Screening  Not Applicable Not Applicable  Skilled Nursing Facility  Not Applicable Not Applicable

## 2024-08-01 NOTE — Plan of Care (Signed)

## 2024-08-02 ENCOUNTER — Inpatient Hospital Stay (HOSPITAL_COMMUNITY)
Admit: 2024-08-02 | Discharge: 2024-08-02 | Disposition: A | Discharge status: Home / Self Care | Attending: Acute Care | Admitting: Acute Care

## 2024-08-02 ENCOUNTER — Inpatient Hospital Stay (HOSPITAL_COMMUNITY)

## 2024-08-02 DIAGNOSIS — G9341 Metabolic encephalopathy: Secondary | ICD-10-CM | POA: Diagnosis not present

## 2024-08-02 DIAGNOSIS — R569 Unspecified convulsions: Secondary | ICD-10-CM | POA: Diagnosis not present

## 2024-08-02 DIAGNOSIS — R4182 Altered mental status, unspecified: Secondary | ICD-10-CM | POA: Diagnosis not present

## 2024-08-02 LAB — GLUCOSE, CAPILLARY
Glucose-Capillary: 116 mg/dL — ABNORMAL HIGH (ref 70–99)
Glucose-Capillary: 100 mg/dL — ABNORMAL HIGH (ref 70–99)
Glucose-Capillary: 105 mg/dL — ABNORMAL HIGH (ref 70–99)
Glucose-Capillary: 120 mg/dL — ABNORMAL HIGH (ref 70–99)
Glucose-Capillary: 134 mg/dL — ABNORMAL HIGH (ref 70–99)
Glucose-Capillary: 109 mg/dL — ABNORMAL HIGH (ref 70–99)

## 2024-08-02 LAB — RENAL FUNCTION PANEL
Albumin: 3.1 g/dL — ABNORMAL LOW (ref 3.5–5.0)
Anion gap: 9 (ref 5–15)
BUN: 30 mg/dL — ABNORMAL HIGH (ref 6–20)
CO2: 23 mmol/L (ref 22–32)
Calcium: 8.2 mg/dL — ABNORMAL LOW (ref 8.9–10.3)
Chloride: 106 mmol/L (ref 98–111)
Creatinine, Ser: 1.59 mg/dL — ABNORMAL HIGH (ref 0.44–1.00)
GFR, Estimated: 37 mL/min — ABNORMAL LOW
Glucose, Bld: 105 mg/dL — ABNORMAL HIGH (ref 70–99)
Phosphorus: 2.2 mg/dL — ABNORMAL LOW (ref 2.5–4.6)
Potassium: 3.8 mmol/L (ref 3.5–5.1)
Sodium: 139 mmol/L (ref 135–145)

## 2024-08-02 LAB — CBC
HCT: 39.1 % (ref 36.0–46.0)
Hemoglobin: 12.2 g/dL (ref 12.0–15.0)
MCH: 27.1 pg (ref 26.0–34.0)
MCHC: 31.2 g/dL (ref 30.0–36.0)
MCV: 86.7 fL (ref 80.0–100.0)
Platelets: 185 K/uL (ref 150–400)
RBC: 4.51 MIL/uL (ref 3.87–5.11)
RDW: 15.5 % (ref 11.5–15.5)
WBC: 3.4 K/uL — ABNORMAL LOW (ref 4.0–10.5)
nRBC: 0 % (ref 0.0–0.2)

## 2024-08-02 MED ORDER — ENSURE PLUS HIGH PROTEIN PO LIQD
237.0000 mL | Freq: Two times a day (BID) | ORAL | Status: DC
  Administered 2024-08-03 – 2024-08-04 (×4): 237 mL via ORAL

## 2024-08-02 NOTE — Progress Notes (Signed)
 EEG complete - results pending

## 2024-08-02 NOTE — Plan of Care (Signed)
" °  Problem: Acute Rehab OT Goals (only OT should resolve) Goal: Pt. Will Perform Grooming Flowsheets (Taken 08/02/2024 1202) Pt Will Perform Grooming:  with modified independence  sitting  with adaptive equipment Goal: Pt. Will Perform Upper Body Bathing Flowsheets (Taken 08/02/2024 1202) Pt Will Perform Upper Body Bathing:  with modified independence  sitting  with adaptive equipment Goal: Pt. Will Perform Lower Body Bathing Flowsheets (Taken 08/02/2024 1202) Pt Will Perform Lower Body Bathing:  with min assist  sitting/lateral leans Goal: Pt. Will Perform Upper Body Dressing Flowsheets (Taken 08/02/2024 1202) Pt Will Perform Upper Body Dressing:  with modified independence  sitting  with adaptive equipment Goal: Pt. Will Perform Lower Body Dressing Flowsheets (Taken 08/02/2024 1202) Pt Will Perform Lower Body Dressing:  with min assist  with adaptive equipment  sitting/lateral leans Goal: Pt. Will Transfer To Toilet Flowsheets (Taken 08/02/2024 1202) Pt Will Transfer to Toilet:  with modified independence  ambulating Goal: Pt. Will Perform Toileting-Clothing Manipulation Flowsheets (Taken 08/02/2024 1202) Pt Will Perform Toileting - Clothing Manipulation and hygiene:  with modified independence  sitting/lateral leans Goal: Pt/Caregiver Will Perform Home Exercise Program Flowsheets (Taken 08/02/2024 1202) Pt/caregiver will Perform Home Exercise Program:  Increased strength  Left upper extremity  Independently  Aleane Wesenberg OT, MOT  "

## 2024-08-02 NOTE — Procedures (Addendum)
 Patient Name: Robin [name]  MRN: [id number]  Epilepsy Attending: Arlin MALVA Krebs  Referring Physician/Provider: Jesus America, NP  Date: 08/02/2024 Duration: 22.40 mins  Patient history: 58yo F with ams. EEG to evaluate for seizure  Level of alertness: Awake  AEDs during EEG study: None  Technical aspects: This EEG study was done with scalp electrodes positioned according to the 10-20 International system of electrode placement. Electrical activity was reviewed with band pass filter of 1-70Hz , sensitivity of 7 uV/mm, display speed of 29mm/sec with a 60Hz  notched filter applied as appropriate. EEG data were recorded continuously and digitally stored.  Video monitoring was available and reviewed as appropriate.  Description: The posterior dominant rhythm consists of 7.5 Hz activity of moderate voltage (25-35 uV) seen predominantly in posterior head regions, symmetric and reactive to eye opening and eye closing. Sleep was characterized by vertex waves, sleep spindles (12 to 14 Hz), maximal frontocentral region.  EEG showed continuous sharply contoured 3 to 5 Hz theta- delta slowing in left hemisphere, maximal left frontotemporal region. Additionally there is intermittent 3-5 hz theta- delta slowing in right hemisphere. Sharp waves were noted in left temporal region. Hyperventilation and photic stimulation were not performed.      ABNORMALITY - Sharp waves, left temporal -Continuous slow, left hemisphere, maximal left frontotemporal region -Intermittent slow, right hemisphere   IMPRESSION: This study showed evidence of epileptogenicity arising from left temporal region. Additionally there is cortical dysfunction in left hemisphere, maximal left frontotemporal region consistent with underlying encephalomalacia. Lastly there is non specific cortical dysfunction in right hemisphere. No seizures were seen throughout the recording.   Robin [name]

## 2024-08-02 NOTE — Plan of Care (Signed)
" °  Problem: Acute Rehab PT Goals(only PT should resolve) Goal: Pt Will Go Supine/Side To Sit Outcome: Progressing Flowsheets (Taken 08/02/2024 1521) Pt will go Supine/Side to Sit:  with contact guard assist  with minimal assist Goal: Patient Will Transfer Sit To/From Stand Outcome: Progressing Flowsheets (Taken 08/02/2024 1521) Patient will transfer sit to/from stand:  with contact guard assist  with supervision Goal: Pt Will Transfer Bed To Chair/Chair To Bed Outcome: Progressing Flowsheets (Taken 08/02/2024 1521) Pt will Transfer Bed to Chair/Chair to Bed:  with supervision  with contact guard assist Goal: Pt Will Ambulate Outcome: Progressing Flowsheets (Taken 08/02/2024 1521) Pt will Ambulate:  25 feet  with contact guard assist  with minimal assist  with cane   3:22 PM, 08/02/2024 Lynwood Music, MPT Physical Therapist with [facility] 336 [phone] office [phone] mobile phone  "

## 2024-08-02 NOTE — Progress Notes (Signed)
 " PROGRESS NOTE  Robin [name], is a 58 y.o. female, DOB - [birth year], FMW:[id number]  Admit date - 07/30/2024   Admitting Physician Posey Maier, DO  Outpatient Primary MD for the patient is Job [facility]ukes, [place]  LOS - 2  Chief Complaint  Patient presents with   Altered Mental Status       Brief Summary:- 58 y.o. 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 CKD 3. DM2 And COPD admitted on 07/31/2024 with acute metabolic encephalopathy in the setting of influenza A infection and AKI on CKD 3A   A/p 1) influenza A--- mentation has improved  -Okay to complete Tamiflu  --- -No further persistent fevers --Overall much improved, continue symptomatic and supportive care   2)AKI----acute kidney injury on CKD stage -3A--due to dehydration in the setting of poor oral intake as a result of altered mentation due to flu -   creatinine on admission= 2.0 ,  baseline creatinine = 1.4-1.5   , -Creatinine is back to baseline -renally adjust medications, avoid nephrotoxic agents / dehydration  / hypotension   3) acute metabolic encephalopathy--due to #1 #2 above - Persistent fevers contributory - CT head and MRI brain without acute strokes or other acute findings - Abnormal EEG report is noted,  however no active seizures noted - No leukocytosis or evidence of bacterial infection at this time -Blood cultures NGTD, UA not suggestive of UTI - Remote history of polysubstance abuse including cocaine in the past - Urine tox screen negative this admission   4)DM2--recent A1c 8.0 reflecting uncontrolled diabetes with hyperglycemia PTA - Hold PTA Jardiance  and Trulicity  as well as Lantus    -Use Novolog /Humalog  Sliding scale insulin  with Accu-Cheks/Fingersticks as ordered    5) chronic systolic dysfunction CHF--echo from July 2024 with EF of 40 to 45% which is improved from echo of April 2023 when EF was 25 to  30% -- History of cocaine abuse with concerns for possible cocaine induced cardiomyopathy -avoid ACEI/ARB/ARNI due to renal concerns - C/n  Coreg  and isosorbide /hydralazine  combo -- Hold Lasix    6)Seizure disorder-- - Okay to stop IV Keppra  and restart PTA Trileptal  and gabapentin  now that patient is able to take oral intake   7)PAFib--Coreg  for rate control and Eliquis  for secondary stroke prophylaxis  8)H/o Prior Stroke with Rt Sided Residual Hemiparesis--at baseline patient lives alone with aide services during the day, she is left-hand dominant but she ambulates around the house with a cane -- Get PT OT eval - Apixaban  as above in #7, and atorvastatin  for secondary stroke prevention LDL 68--Even if her lipid panel is within desired limits, patient should still take atorvastatin  for it's Pleiotropic effects (beyond cholesterol lowering benefits)  9)Social/Ethics--plan of care discussed with patient's boyfriend of 7 years [name] at bedside, questions answered -- Patient's daughter previously updated -[name] sister -[name]---is Primary Contact/HCPOA--I called and updated patient's sister on 08/01/2024 - Patient remains a full code  10)Morbid Obesity- -Low calorie diet, portion control, especially due to residual hemiparesis which limits ability to be physically active discussed with patient -Body mass index is 40.78 kg/m.  11)HTN--stage II, c/n  carvedilol , isosorbide /hydralazine  combo  Status is: Inpatient   Disposition: The patient is from: Home              Anticipated d/c is to: SNF              Anticipated d/c date is: 1 day  Patient currently is medically stable to d/c. Barriers: -Awaiting insurance approval for SNF placement  Code Status : -  Code Status: Full Code   Family Communication:   plan of care discussed with patient's boyfriend of 7 years [name] at bedside [name] sister -[name]---is Primary Contact/HCPOA--I called and updated patient's  sister on 08/01/2024  DVT Prophylaxis  :   - SCDs  SCDs Start: 08/02/24 1801 Place TED hose Start: 08/02/24 1801 apixaban  (ELIQUIS ) tablet 5 mg   Lab Results  Component Value Date   PLT 185 08/02/2024   Inpatient Medications  Scheduled Meds:  apixaban   5 mg Oral BID   atorvastatin   20 mg Oral Daily   carvedilol   12.5 mg Oral BID WC   Chlorhexidine  Gluconate Cloth  6 each Topical Q0600   feeding supplement  237 mL Oral BID BM   ferrous sulfate   325 mg Oral Q breakfast   gabapentin   200 mg Oral BID   hydrALAZINE   25 mg Oral TID   insulin  aspart  0-15 Units Subcutaneous Q4H   ipratropium-albuterol   3 mL Nebulization TID   isosorbide  mononitrate  30 mg Oral Daily   mouth rinse  15 mL Mouth Rinse 4 times per day   oseltamivir   30 mg Oral BID   OXcarbazepine   150 mg Oral BID   pantoprazole   40 mg Oral Daily   Continuous Infusions:   PRN Meds:.acetaminophen , acetaminophen , albuterol , labetalol , LORazepam , mouth rinse   Anti-infectives (From admission, onward)    Start     Dose/Rate Route Frequency Ordered Stop   08/01/24 1715  oseltamivir  (TAMIFLU ) capsule 30 mg        30 mg Oral 2 times daily 08/01/24 1618 08/06/24 2159   07/31/24 1000  oseltamivir  (TAMIFLU ) capsule 30 mg  Status:  Discontinued        30 mg Oral Daily 07/30/24 2354 08/01/24 1121   07/30/24 2245  cefTRIAXone  (ROCEPHIN ) 2 g in sodium chloride  0.9 % 100 mL IVPB        2 g 200 mL/hr over 30 Minutes Intravenous Once 07/30/24 2240 07/30/24 2330   07/30/24 2245  azithromycin  (ZITHROMAX ) 500 mg in sodium chloride  0.9 % 250 mL IVPB        500 mg 250 mL/hr over 60 Minutes Intravenous  Once 07/30/24 2240 07/31/24 0030       Subjective: Robin [name] today has no further fevers, no emesis,  No chest pain,    - Participated in physical therapy evaluation - Eating well  Objective: Vitals:   08/02/24 1400 08/02/24 1500 08/02/24 1615 08/02/24 1700  BP: (!) 134/95 129/71  (!) 151/81  Pulse: (!) 102 100  (!) 101   Resp: 16 (!) 21  19  Temp:   98.6 F (37 C)   TempSrc:   Oral   SpO2: 96% 97%  96%  Weight:      Height:        Intake/Output Summary (Last 24 hours) at 08/02/2024 1801 Last data filed at 08/02/2024 0858 Gross per 24 hour  Intake 1660.1 ml  Output 800 ml  Net 860.1 ml   Filed Weights   07/31/24 0237 08/01/24 0447 08/02/24 0457  Weight: 113.4 kg 112.8 kg 114.6 kg    Physical Exam Gen:- Awake Alert, morbidly obese, in no apparent distress, expressive aphasia HEENT:- [place].AT, No sclera icterus Neck-Supple Neck,No JVD,.  Lungs-  CTAB , fair symmetrical air movement CV- S1, S2 normal, regular  Abd-  +ve B.Sounds, Abd Soft, No  tenderness, increased truncal adiposity Extremity/Skin:- No  edema, pedal pulses present  Psych-affect is appropriate, oriented x3 Neuro-residual right-sided hemiparesis, residual expressive aphasia, no additional new focal deficits, no tremors,, patient is left-hand dominant at baseline  Data Reviewed: I have personally reviewed following labs and imaging studies  CBC: Recent Labs  Lab 07/30/24 2229 08/02/24 0335  WBC 7.6 3.4*  NEUTROABS 5.8  --   HGB 14.0 12.2  HCT 45.5 39.1  MCV 88.0 86.7  PLT 246 185   Basic Metabolic Panel: Recent Labs  Lab 07/30/24 2229 07/31/24 0050 08/02/24 0335  NA 142  --  139  K 4.2  --  3.8  CL 104  --  106  CO2 23  --  23  GLUCOSE 109*  --  105*  BUN 25*  --  30*  CREATININE 2.00*  --  1.59*  CALCIUM  9.2  --  8.2*  MG  --  2.1  --   PHOS  --  3.2 2.2*   GFR: Estimated Creatinine Clearance: 49.6 mL/min (A) (by C-G formula based on SCr of 1.59 mg/dL (H)). Liver Function Tests: Recent Labs  Lab 07/30/24 2229 08/02/24 0335  AST 21  --   ALT 11  --   ALKPHOS 103  --   BILITOT 0.4  --   PROT 7.4  --   ALBUMIN 4.3 3.1*   Recent Results (from the past 240 hours)  Culture, blood (Routine x 2)     Status: None (Preliminary result)   Collection Time: 07/30/24 10:29 PM   Specimen: BLOOD LEFT FOREARM   Result Value Ref Range Status   Specimen Description BLOOD LEFT FOREARM  Final   Special Requests   Final    BOTTLES DRAWN AEROBIC AND ANAEROBIC Blood Culture results may not be optimal due to an inadequate volume of blood received in culture bottles   Culture   Final    NO GROWTH 3 DAYS Performed at [facility], [address] [place], [name] [address]    Report Status PENDING  Incomplete  Resp panel by RT-PCR (RSV, Flu A&B, Covid) Anterior Nasal Swab     Status: Abnormal   Collection Time: 07/30/24 10:29 PM   Specimen: Anterior Nasal Swab  Result Value Ref Range Status   SARS Coronavirus 2 by RT PCR NEGATIVE NEGATIVE Final    Comment: (NOTE) SARS-CoV-2 target nucleic acids are NOT DETECTED.  The SARS-CoV-2 RNA is generally detectable in upper respiratory specimens during the acute phase of infection. The lowest concentration of SARS-CoV-2 viral copies this assay can detect is 138 copies/mL. A negative result does not preclude SARS-Cov-2 infection and should not be used as the sole basis for treatment or other patient management decisions. A negative result may occur with  improper specimen collection/handling, submission of specimen other than nasopharyngeal swab, presence of viral mutation(s) within the areas targeted by this assay, and inadequate number of viral copies(<138 copies/mL). A negative result must be combined with clinical observations, patient history, and epidemiological information. The expected result is Negative.  Fact Sheet for Patients:  bloggercourse.com  Fact Sheet for Healthcare Providers:  seriousbroker.it  This test is no t yet approved or cleared by the [place]  FDA and  has been authorized for detection and/or diagnosis of SARS-CoV-2 by FDA under an Emergency Use Authorization (EUA). This EUA will remain  in effect (meaning this test can be used) for the duration of the COVID-19 declaration  under Section 564(b)(1) of the Act, 21 U.S.C.section 360bbb-3(b)(1),  unless the authorization is terminated  or revoked sooner.       Influenza A by PCR POSITIVE (A) NEGATIVE Final   Influenza B by PCR NEGATIVE NEGATIVE Final    Comment: (NOTE) The Xpert Xpress SARS-CoV-2/FLU/RSV plus assay is intended as an aid in the diagnosis of influenza from Nasopharyngeal swab specimens and should not be used as a sole basis for treatment. Nasal washings and aspirates are unacceptable for Xpert Xpress SARS-CoV-2/FLU/RSV testing.  Fact Sheet for Patients: bloggercourse.com  Fact Sheet for Healthcare Providers: seriousbroker.it  This test is not yet approved or cleared by the [place]  FDA and has been authorized for detection and/or diagnosis of SARS-CoV-2 by FDA under an Emergency Use Authorization (EUA). This EUA will remain in effect (meaning this test can be used) for the duration of the COVID-19 declaration under Section 564(b)(1) of the Act, 21 U.S.C. section 360bbb-3(b)(1), unless the authorization is terminated or revoked.     Resp Syncytial Virus by PCR NEGATIVE NEGATIVE Final    Comment: (NOTE) Fact Sheet for Patients: bloggercourse.com  Fact Sheet for Healthcare Providers: seriousbroker.it  This test is not yet approved or cleared by the [place]  FDA and has been authorized for detection and/or diagnosis of SARS-CoV-2 by FDA under an Emergency Use Authorization (EUA). This EUA will remain in effect (meaning this test can be used) for the duration of the COVID-19 declaration under Section 564(b)(1) of the Act, 21 U.S.C. section 360bbb-3(b)(1), unless the authorization is terminated or revoked.  Performed at [facility], [address] [place], [name] [address]   Culture, blood (Routine x 2)     Status: None (Preliminary result)   Collection Time: 07/30/24 10:43  PM   Specimen: BLOOD RIGHT HAND  Result Value Ref Range Status   Specimen Description BLOOD RIGHT HAND  Final   Special Requests   Final    BOTTLES DRAWN AEROBIC AND ANAEROBIC Blood Culture results may not be optimal due to an inadequate volume of blood received in culture bottles   Culture   Final    NO GROWTH 3 DAYS Performed at [facility], [address] [place], [name] [address]    Report Status PENDING  Incomplete  MRSA Next Gen by PCR, Nasal     Status: None   Collection Time: 07/31/24  2:28 AM   Specimen: Nasal Mucosa; Nasal Swab  Result Value Ref Range Status   MRSA by PCR Next Gen NOT DETECTED NOT DETECTED Final    Comment: (NOTE) The GeneXpert MRSA Assay (FDA approved for NASAL specimens only), is one component of a comprehensive MRSA colonization surveillance program. It is not intended to diagnose MRSA infection nor to guide or monitor treatment for MRSA infections. Test performance is not FDA approved in patients less than 94 years old. Performed at [facility], [address], [place], [name] [address]    Radiology Studies: EEG adult Result Date: 08/02/2024 Shelton Arlin KIDD, MD     08/02/2024  5:31 PM Patient Name: Meda Dudzinski [place] MRN: [id number] Epilepsy Attending: Arlin KIDD Shelton Referring Physician/Provider: Jesus America, NP Date: 08/02/2024 Duration: 22.40 mins Patient history: 58yo F with ams. EEG to evaluate for seizure Level of alertness: Awake AEDs during EEG study: None Technical aspects: This EEG study was done with scalp electrodes positioned according to the 10-20 International system of electrode placement. Electrical activity was reviewed with band pass filter of 1-70Hz , sensitivity of 7 uV/mm, display speed of 35mm/sec with a 60Hz  notched filter applied as appropriate.  EEG data were recorded continuously and digitally stored.  Video monitoring was available and reviewed as appropriate. Description: The posterior dominant rhythm consists of 7.5 Hz  activity of moderate voltage (25-35 uV) seen predominantly in posterior head regions, symmetric and reactive to eye opening and eye closing. Sleep was characterized by vertex waves, sleep spindles (12 to 14 Hz), maximal frontocentral region.  EEG showed continuous sharply contoured 3 to 5 Hz theta- delta slowing in left hemisphere, maximal left frontotemporal region. Additionally there is intermittent 3-5 hz theta- delta slowing in right hemisphere. Sharp waves were noted in left temporal region. Hyperventilation and photic stimulation were not performed.    ABNORMALITY - Sharp waves, left temporal -Continuous slow, left hemisphere, maximal left frontotemporal region -Intermittent slow, right hemisphere  IMPRESSION: This study showed evidence of epileptogenicity arising from left temporal region. Additionally there is cortical dysfunction in left hemisphere, maximal left frontotemporal region consistent with underlying encephalomalacia. Lastly there is non specific cortical dysfunction in right hemisphere. No seizures were seen throughout the recording.  Priyanka O Yadav    Scheduled Meds:  apixaban   5 mg Oral BID   atorvastatin   20 mg Oral Daily   carvedilol   12.5 mg Oral BID WC   Chlorhexidine  Gluconate Cloth  6 each Topical Q0600   feeding supplement  237 mL Oral BID BM   ferrous sulfate   325 mg Oral Q breakfast   gabapentin   200 mg Oral BID   hydrALAZINE   25 mg Oral TID   insulin  aspart  0-15 Units Subcutaneous Q4H   ipratropium-albuterol   3 mL Nebulization TID   isosorbide  mononitrate  30 mg Oral Daily   mouth rinse  15 mL Mouth Rinse 4 times per day   oseltamivir   30 mg Oral BID   OXcarbazepine   150 mg Oral BID   pantoprazole   40 mg Oral Daily   Continuous Infusions:   LOS: 2 days   Rendall Carwin M.D on 08/02/2024 at 6:01 PM  Go to [email or web address] - for contact info  [facility] - Office  [phone]  If 7PM-7AM, please contact night-coverage [email or web address] 08/02/2024,  6:01 PM    "

## 2024-08-02 NOTE — Evaluation (Signed)
 Occupational Therapy Evaluation Patient Details Name: Robin [name] MRN: [id number] DOB: 08-11-1965 Today's Date: 08/02/2024   History of Present Illness   Robin [name] is a 58 y.o. 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 CKD 3. DM2. And COPD.  Acc to ER md patient came in with altered mental status and found + flu. Hospitalists consulted for admission. CT head requested given altered mentation and history. Patient unable to answer uestions bedside and no family present  Sister emergency contact called, acf to her patient with aphasia at baseline, independent functioning. She reports patient is the one who called EMS due to pain.  Patient apparently known to EMS and patient not at baseline mental status (per NP)     Clinical Impressions Pt agreeable to OT and PT co-evaluation. Pt has expressive aphasia at baseline but was able to communicate some about prior function and home set up. Pt required mod A for bed mobility and min A for EOB to chair transfer anda ambulation to the toilet. L UE generally weak and R UE limited at baseline from stroke. Pt is assisted for most ADL's at baseline as she was today. Pt left in the chair with call bell within reach. Pt will benefit from continued OT in the hospital to increase strength, balance, and endurance for safe ADL's.        If plan is discharge home, recommend the following:   A little help with walking and/or transfers;A lot of help with bathing/dressing/bathroom;Assistance with cooking/housework;Assist for transportation;Help with stairs or ramp for entrance;Assistance with feeding     Functional Status Assessment   Patient has had a recent decline in their functional status and demonstrates the ability to make significant improvements in function in a reasonable and predictable amount of time.     Equipment Recommendations   None recommended  by OT             Precautions/Restrictions   Precautions Precautions: Fall Recall of Precautions/Restrictions: Intact Restrictions Weight Bearing Restrictions Per Provider Order: No     Mobility Bed Mobility Overal bed mobility: Needs Assistance Bed Mobility: Supine to Sit     Supine to sit: Mod assist, HOB elevated     General bed mobility comments: Labored movement; assist to move R LE to EOB and to pull to sit.    Transfers Overall transfer level: Needs assistance Equipment used: Straight cane Transfers: Sit to/from Stand, Bed to chair/wheelchair/BSC Sit to Stand: Contact guard assist, Min assist     Step pivot transfers: Min assist     General transfer comment: Unsteady; assist to lower to chair.      Balance Overall balance assessment: Needs assistance Sitting-balance support: Single extremity supported, Feet supported Sitting balance-Leahy Scale: Fair Sitting balance - Comments: seated at EOB   Standing balance support: During functional activity, Single extremity supported Standing balance-Leahy Scale: Poor Standing balance comment: poor to fair with AD                           ADL either performed or assessed with clinical judgement   ADL Overall ADL's : Needs assistance/impaired Eating/Feeding: Set up;Sitting   Grooming: Moderate assistance;Sitting;Minimal assistance   Upper Body Bathing: Minimal assistance;Sitting   Lower Body Bathing: Maximal assistance;Sitting/lateral leans   Upper Body Dressing : Minimal assistance;Sitting   Lower Body Dressing: Maximal assistance;Sitting/lateral leans Lower Body Dressing Details (indicate cue  type and reason): Assisted at baseline Toilet Transfer: Minimal assistance;Ambulation [facility]) Toilet Transfer Details (indicate cue type and reason): Ambualted to toilet with SPC. Slight assist to lower to toilet. Toileting- Clothing Manipulation and Hygiene: Set up;Contact guard  assist;Sitting/lateral lean       Functional mobility during ADLs: Minimal assistance;Cane       Vision Baseline Vision/History: 0 No visual deficits Ability to See in Adequate Light: 0 Adequate Vision Assessment?: No apparent visual deficits     Perception Perception: Not tested       Praxis Praxis: Not tested       Pertinent Vitals/Pain Pain Assessment Pain Assessment: Faces Faces Pain Scale: No hurt     Extremity/Trunk Assessment Upper Extremity Assessment Upper Extremity Assessment: RUE deficits/detail;LUE deficits/detail RUE Deficits / Details: Limited by previous stroke; 3+/5 grip only. No other functional movement noted. RUE Coordination: decreased fine motor;decreased gross motor LUE Deficits / Details: Generally weak 4/5 grossly.   Lower Extremity Assessment Lower Extremity Assessment: Defer to PT evaluation   Cervical / Trunk Assessment Cervical / Trunk Assessment: Normal   Communication Communication Communication: Impaired Factors Affecting Communication: Difficulty expressing self   Cognition Arousal: Alert Behavior During Therapy: WFL for tasks assessed/performed Cognition: No apparent impairments                               Following commands: Intact       Cueing  General Comments   Cueing Techniques: Verbal cues;Tactile cues;Visual cues                 Home Living Family/patient expects to be discharged to:: Private residence Living Arrangements: Alone Available Help at Discharge: Family;Available PRN/intermittently;Personal care attendant Type of Home: Apartment Home Access: Stairs to enter Entrance Stairs-Number of Steps: 3-4 Entrance Stairs-Rails: None Home Layout: One level     Bathroom Shower/Tub: Chief Strategy Officer: Standard Bathroom Accessibility: Yes   Home Equipment: Agricultural Consultant (2 wheels);Cane - single point;BSC/3in1;Shower seat   Additional Comments: Per chart review and some  pt confirmation. PCA during the day. Chart indicates 9 AM to 1 PM seven days a week and 4 PM to 9 PM 5 days a week.      Prior Functioning/Environment Prior Level of Function : Needs assist       Physical Assist : ADLs (physical)   ADLs (physical): Bathing;Dressing;Grooming;IADLs;Toileting Mobility Comments: household ambulation using SPC ADLs Comments: Assist with ADL's    OT Problem List: Decreased strength;Decreased range of motion;Decreased activity tolerance;Impaired balance (sitting and/or standing);Decreased coordination;Impaired UE functional use   OT Treatment/Interventions: Self-care/ADL training;Therapeutic exercise;Therapeutic activities;Patient/family education;Balance training;DME and/or AE instruction      OT Goals(Current goals can be found in the care plan section)   Acute Rehab OT Goals Patient Stated Goal: Improve function. OT Goal Formulation: With patient Time For Goal Achievement: 08/16/24 Potential to Achieve Goals: Good   OT Frequency:  Min 2X/week    Co-evaluation PT/OT/SLP Co-Evaluation/Treatment: Yes Reason for Co-Treatment: To address functional/ADL transfers   OT goals addressed during session: ADL's and self-care                       End of Session Equipment Utilized During Treatment: Gait belt;Other (comment) (SPC)  Activity Tolerance: Patient tolerated treatment well Patient left: in chair;with call bell/phone within reach  OT Visit Diagnosis: Unsteadiness on feet (R26.81);Other abnormalities of gait and mobility (R26.89);Muscle weakness (generalized) (M62.81);Hemiplegia  and hemiparesis Hemiplegia - Right/Left: Right Hemiplegia - dominant/non-dominant: Non-Dominant                Time: 9090-9066 OT Time Calculation (min): 24 min Charges:  OT General Charges $OT Visit: 1 Visit OT Evaluation $OT Eval Low Complexity: 1 Low  Melanie Pellot OT, MOT  Jayson Howington 08/02/2024, 12:00 PM

## 2024-08-02 NOTE — TOC Progression Note (Signed)
 Transition of Care [facility]) - Progression Note    Patient Details  Name: Robin [name] MRN: [id number] Date of Birth: 03/08/1966  Transition of Care [facility]) CM/SW Contact  [name], [name] Phone Number: 08/02/2024, 11:01 AM  Clinical Narrative: PT evaluated pt and recommend SNF. LCSW discussed with pt at bedside and sister on phone. Both are in agreement and request [name], [name], or [name]. Will initiate bed search and SNF auth.       Expected Discharge Plan: Home/Self Care Barriers to Discharge: Continued Medical Work up               Expected Discharge Plan and Services In-house Referral: Clinical Social Work Discharge Planning Services: CM Consult Post Acute Care Choice: Resumption of Svcs/PTA Provider Living arrangements for the past 2 months: Apartment Expected Discharge Date: 08/02/24                                     Social Drivers of Health (SDOH) Interventions SDOH Screenings   Food Insecurity: Patient Unable To Answer (07/31/2024)  Housing: Patient Unable To Answer (07/31/2024)  Transportation Needs: Patient Unable To Answer (07/31/2024)  Utilities: Patient Unable To Answer (07/31/2024)  Alcohol  Screen: Low Risk (12/10/2023)  Depression (PHQ2-9): Low Risk (12/10/2023)  Financial Resource Strain: Low Risk (12/10/2023)  Physical Activity: Sufficiently Active (12/10/2023)  Social Connections: Moderately Isolated (12/10/2023)  Stress: No Stress Concern Present (12/10/2023)  Tobacco Use: Medium Risk (07/30/2024)  Health Literacy: Adequate Health Literacy (12/10/2023)  Recent Concern: Health Literacy - Medium Risk (09/18/2023)   Received from [name]    Readmission Risk Interventions    08/01/2024    1:45 PM 09/26/2023   10:11 AM 09/24/2023    2:14 PM  Readmission Risk Prevention Plan  Transportation Screening Complete Complete Complete  HRI or Home Care Consult Complete    Social Work Consult for Recovery Care Planning/Counseling  Complete    Palliative Care Screening Not Applicable    Medication Review Oceanographer) Complete Complete Complete  HRI or Home Care Consult  Complete Complete  SW Recovery Care/Counseling Consult  Complete Complete  Palliative Care Screening  Not Applicable Not Applicable  Skilled Nursing Facility  Not Applicable Not Applicable

## 2024-08-02 NOTE — [place] FL2 (Signed)
 " [place]  MEDICAID FL2 LEVEL OF CARE FORM     IDENTIFICATION  Patient Name: Robin [name] Birthdate: Feb 23, 1966 Sex: female Admission Date (Current Location): 07/30/2024  [place] Number:  Raynaldo [id number] Facility and Address:  [facility],  [address], [place] [address]      Provider Number: [id number]  Attending Physician Name and Address:  Pearlean Manus, MD  Relative Name and Phone Number:       Current Level of Care: Hospital Recommended Level of Care: Skilled Nursing Facility Prior Approval Number:    Date Approved/Denied:   PASRR Number: [id number]  Discharge Plan: SNF    Current Diagnoses: Patient Active Problem List   Diagnosis Date Noted   Type 2 diabetes mellitus with chronic kidney disease, with long-term current use of insulin  (HCC) 09/24/2023   Diabetic neuropathy (HCC) 09/24/2023   Seizure disorder (HCC) 09/24/2023   Stage 3b chronic kidney disease (CKD) (HCC) 11/27/2021   Type 2 diabetes mellitus with stage 3a chronic kidney disease, with long-term current use of insulin  (HCC) 03/09/2021   Acute metabolic encephalopathy 12/31/2020   Prolonged QT interval 05/12/2020   History of CVA with residual deficit 05/12/2020   Atrial fibrillation, chronic (HCC) 05/12/2020   Right hemiparesis (HCC)    Essential hypertension    Chronic diastolic congestive heart failure (HCC)    Dysphagia due to recent stroke 01/05/2020   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 01/05/2020   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 12/27/2019   Thoracic aortic aneurysm without rupture    Type 2 diabetes mellitus with diabetic autonomic neuropathy, with long-term current use of insulin  (HCC) 09/01/2018   Cocaine abuse (HCC) 08/25/2018   Cigarette nicotine dependence, uncomplicated 06/07/2015   GERD (gastroesophageal reflux disease) 06/07/2011   Arthritis 06/07/2011    Orientation RESPIRATION BLADDER Height &  Weight     Self, Time, Situation, Place  Normal External catheter Weight: 252 lb 10.4 oz (114.6 kg) Height:  5' 6 (167.6 cm)  BEHAVIORAL SYMPTOMS/MOOD NEUROLOGICAL BOWEL NUTRITION STATUS      Continent Diet (See d/c summary)  AMBULATORY STATUS COMMUNICATION OF NEEDS Skin   Extensive Assist Verbally Normal                       Personal Care Assistance Level of Assistance  Bathing, Feeding, Dressing Bathing Assistance: Maximum assistance Feeding assistance: Limited assistance Dressing Assistance: Maximum assistance     Functional Limitations Info  Sight, Speech, Hearing Sight Info: Adequate Hearing Info: Adequate Speech Info: Adequate    SPECIAL CARE FACTORS FREQUENCY  PT (By licensed PT)     PT Frequency: 5x weekly              Contractures      Additional Factors Info  Code Status, Allergies, Isolation Precautions Code Status Info: Full Allergies Info: Bee Venom  Losartan   Naproxen   Lisinopril   Penicillins     Isolation Precautions Info: Droplet- Influenza A     Current Medications (08/02/2024):  This is the current hospital active medication list Current Facility-Administered Medications  Medication Dose Route Frequency Provider Last Rate Last Admin   acetaminophen  (TYLENOL ) suppository 650 mg  650 mg Rectal Q4H PRN Emokpae, Courage, MD   650 mg at 07/31/24 2206   acetaminophen  (TYLENOL ) tablet 650 mg  650 mg Oral Q6H PRN Pearlean Manus, MD   650 mg at 08/01/24 2143   albuterol  (PROVENTIL ) (2.5 MG/3ML) 0.083% nebulizer solution 2.5 mg  2.5 mg Nebulization Q4H PRN Adefeso, Oladapo, DO       apixaban  (ELIQUIS ) tablet 5 mg  5 mg Oral BID Emokpae, Courage, MD   5 mg at 08/02/24 0911   atorvastatin  (LIPITOR) tablet 20 mg  20 mg Oral Daily Emokpae, Courage, MD   20 mg at 08/02/24 0913   carvedilol  (COREG ) tablet 12.5 mg  12.5 mg Oral BID WC Emokpae, Courage, MD   12.5 mg at 08/02/24 0911   Chlorhexidine  Gluconate Cloth 2 % PADS 6 each  6 each Topical Q0600  Adefeso, Oladapo, DO   6 each at 08/02/24 0526   feeding supplement (ENSURE PLUS HIGH PROTEIN) liquid 237 mL  237 mL Oral BID BM Emokpae, Courage, MD       ferrous sulfate  tablet 325 mg  325 mg Oral Q breakfast Emokpae, Courage, MD   325 mg at 08/02/24 9087   gabapentin  (NEURONTIN ) capsule 200 mg  200 mg Oral BID Emokpae, Courage, MD   200 mg at 08/02/24 0912   hydrALAZINE  (APRESOLINE ) tablet 25 mg  25 mg Oral TID Emokpae, Courage, MD   25 mg at 08/02/24 0912   insulin  aspart (novoLOG ) injection 0-15 Units  0-15 Units Subcutaneous Q4H Jesus America, NP   2 Units at 08/02/24 0026   ipratropium-albuterol  (DUONEB) 0.5-2.5 (3) MG/3ML nebulizer solution 3 mL  3 mL Nebulization TID Adefeso, Oladapo, DO   3 mL at 08/02/24 9161   isosorbide  mononitrate (IMDUR ) 24 hr tablet 30 mg  30 mg Oral Daily Emokpae, Courage, MD   30 mg at 08/02/24 0911   labetalol  (NORMODYNE ) injection 10 mg  10 mg Intravenous Q4H PRN Emokpae, Courage, MD   10 mg at 08/01/24 0118   LORazepam  (ATIVAN ) injection 2 mg  2 mg Intravenous PRN Jesus America, NP       Oral care mouth rinse  15 mL Mouth Rinse 4 times per day Pearlean Manus, MD   15 mL at 08/02/24 0912   Oral care mouth rinse  15 mL Mouth Rinse PRN Emokpae, Courage, MD       oseltamivir  (TAMIFLU ) capsule 30 mg  30 mg Oral BID Emokpae, Courage, MD   30 mg at 08/02/24 0911   OXcarbazepine  (TRILEPTAL ) tablet 150 mg  150 mg Oral BID Emokpae, Courage, MD   150 mg at 08/02/24 0911   pantoprazole  (PROTONIX ) EC tablet 40 mg  40 mg Oral Daily Emokpae, Courage, MD   40 mg at 08/02/24 0911     Discharge Medications: Please see discharge summary for a list of discharge medications.  Relevant Imaging Results:  Relevant Lab Results:   Additional Information SSN: [id number]  [name], LCSW     "

## 2024-08-02 NOTE — Care Management Important Message (Signed)
 Important Message  Patient Details  Name: Robin [name] MRN: [id number] Date of Birth: 1965-10-18   Important Message Given:  N/A - LOS <3 / Initial given by admissions     [name] 08/02/2024, 10:24 AM

## 2024-08-02 NOTE — Evaluation (Signed)
 Physical Therapy Evaluation Patient Details Name: Robin [name] MRN: [id number] DOB: Aug 09, 1965 Today's Date: 08/02/2024  History of Present Illness  Robin [name] is a 58 y.o. 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 CKD 3. DM2. And COPD.  Acc to ER md patient came in with altered mental status and found + flu. Hospitalists consulted for admission. CT head requested given altered mentation and history. Patient unable to answer uestions bedside and no family present  Sister emergency contact called, acf to her patient with aphasia at baseline, independent functioning. She reports patient is the one who called EMS due to pain.  Patient apparently known to EMS and patient not at baseline mental status   Clinical Impression  Patient demonstrates slow labored movement for sitting up at bedside requiring frequent rest breaks due to c/o fatigue, limited to a few steps in room using SPC with LUE due to c/o fatigue and generalized weakness. Patient tolerated sitting up in chair after therapy. Patient will benefit from continued skilled physical therapy in hospital and recommended venue below to increase strength, balance, endurance for safe ADLs and gait.           If plan is discharge home, recommend the following: A little help with bathing/dressing/bathroom;Help with stairs or ramp for entrance;Assist for transportation;A lot of help with bathing/dressing/bathroom;A little help with walking and/or transfers   Can travel by private vehicle   Yes    Equipment Recommendations None recommended by PT  Recommendations for Other Services       Functional Status Assessment Patient has had a recent decline in their functional status and demonstrates the ability to make significant improvements in function in a reasonable and predictable amount of time.     Precautions / Restrictions Precautions Precautions:  Fall Recall of Precautions/Restrictions: Intact Restrictions Weight Bearing Restrictions Per Provider Order: No      Mobility  Bed Mobility Overal bed mobility: Needs Assistance Bed Mobility: Supine to Sit     Supine to sit: Mod assist, HOB elevated     General bed mobility comments: increased time, labored movement    Transfers Overall transfer level: Needs assistance Equipment used: Straight cane Transfers: Sit to/from Stand, Bed to chair/wheelchair/BSC Sit to Stand: Contact guard assist, Min assist   Step pivot transfers: Min assist       General transfer comment: increased time for completing sit to stands due to BLE weakness    Ambulation/Gait Ambulation/Gait assistance: Min assist Gait Distance (Feet): 12 Feet Assistive device: Quad cane, 1 Millett hand held assist Gait Pattern/deviations: Decreased step length - right, Decreased step length - left, Decreased stride length, Step-to pattern Gait velocity: slow     General Gait Details: limited to a few steps at bedside due to c/o fatigue and generalized weakness  Stairs            Wheelchair Mobility     Tilt Bed    Modified Rankin (Stroke Patients Only)       Balance Overall balance assessment: Needs assistance Sitting-balance support: Feet supported, No upper extremity supported Sitting balance-Leahy Scale: Fair Sitting balance - Comments: seated at EOB   Standing balance support: During functional activity, Single extremity supported Standing balance-Leahy Scale: Poor Standing balance comment: fair/poor using SPC                             Pertinent  Vitals/Pain Pain Assessment Pain Assessment: Faces Faces Pain Scale: No hurt    Home Living Family/patient expects to be discharged to:: Private residence Living Arrangements: Alone Available Help at Discharge: Family;Available PRN/intermittently;Personal care attendant Type of Home: Apartment Home Access: Stairs to  enter Entrance Stairs-Rails: None Entrance Stairs-Number of Steps: 3-4   Home Layout: One level Home Equipment: Agricultural Consultant (2 wheels);Cane - single point;BSC/3in1;Shower seat Additional Comments: Per chart review and some pt confirmation. PCA during the day. Chart indicates 9 AM to 1 PM seven days a week and 4 PM to 9 PM 5 days a week.    Prior Function Prior Level of Function : Needs assist       Physical Assist : Mobility (physical);ADLs (physical) Mobility (physical): Bed mobility;Transfers;Gait;Stairs ADLs (physical): Bathing;Dressing;Grooming;IADLs;Toileting Mobility Comments: household ambulation using SPC ADLs Comments: Assist with ADL's     Extremity/Trunk Assessment   Upper Extremity Assessment Upper Extremity Assessment: Defer to OT evaluation    Lower Extremity Assessment Lower Extremity Assessment: Generalized weakness;RLE deficits/detail;LLE deficits/detail RLE Deficits / Details: grossly -4/5 RLE Sensation: WNL RLE Coordination: WNL LLE Deficits / Details: grossly 3+/5 LLE Sensation: WNL LLE Coordination: WNL    Cervical / Trunk Assessment Cervical / Trunk Assessment: Normal  Communication   Communication Communication: Impaired Factors Affecting Communication: Difficulty expressing self    Cognition Arousal: Alert Behavior During Therapy: WFL for tasks assessed/performed                             Following commands: Intact       Cueing Cueing Techniques: Verbal cues, Tactile cues, Visual cues     General Comments      Exercises     Assessment/Plan    PT Assessment Patient needs continued PT services  PT Problem List Decreased strength;Decreased activity tolerance;Decreased balance;Decreased mobility       PT Treatment Interventions DME instruction;Gait training;Stair training;Functional mobility training;Therapeutic activities;Therapeutic exercise;Balance training;Patient/family education    PT Goals (Current goals  can be found in the Care Plan section)  Acute Rehab PT Goals Patient Stated Goal: return home after rehab PT Goal Formulation: With patient Time For Goal Achievement: 08/16/24 Potential to Achieve Goals: Good    Frequency Min 3X/week     Co-evaluation PT/OT/SLP Co-Evaluation/Treatment: Yes Reason for Co-Treatment: To address functional/ADL transfers PT goals addressed during session: Mobility/safety with mobility;Balance;Proper use of DME         AM-PAC PT 6 Clicks Mobility  Outcome Measure Help needed turning from your back to your side while in a flat bed without using bedrails?: A Lot Help needed moving from lying on your back to sitting on the side of a flat bed without using bedrails?: A Lot Help needed moving to and from a bed to a chair (including a wheelchair)?: A Little Help needed standing up from a chair using your arms (e.g., wheelchair or bedside chair)?: A Little Help needed to walk in hospital room?: A Little Help needed climbing 3-5 steps with a railing? : A Lot 6 Click Score: 15    End of Session   Activity Tolerance: Patient tolerated treatment well;Patient limited by fatigue Patient left: in chair;with call bell/phone within reach Nurse Communication: Mobility status PT Visit Diagnosis: Unsteadiness on feet (R26.81);Other abnormalities of gait and mobility (R26.89);Muscle weakness (generalized) (M62.81)    Time: 9092-9066 PT Time Calculation (min) (ACUTE ONLY): 26 min   Charges:   PT Evaluation $PT Eval Moderate Complexity: 1 Mod  PT Treatments $Therapeutic Activity: 23-37 mins PT General Charges $$ ACUTE PT VISIT: 1 Visit         3:17 PM, 08/02/2024 Lynwood Music, MPT Physical Therapist with [facility] [phone] office [phone] mobile phone

## 2024-08-03 DIAGNOSIS — G9341 Metabolic encephalopathy: Secondary | ICD-10-CM | POA: Diagnosis not present

## 2024-08-03 LAB — GLUCOSE, CAPILLARY
Glucose-Capillary: 113 mg/dL — ABNORMAL HIGH (ref 70–99)
Glucose-Capillary: 102 mg/dL — ABNORMAL HIGH (ref 70–99)
Glucose-Capillary: 111 mg/dL — ABNORMAL HIGH (ref 70–99)
Glucose-Capillary: 124 mg/dL — ABNORMAL HIGH (ref 70–99)
Glucose-Capillary: 162 mg/dL — ABNORMAL HIGH (ref 70–99)
Glucose-Capillary: 141 mg/dL — ABNORMAL HIGH (ref 70–99)

## 2024-08-03 MED ORDER — SENNOSIDES-DOCUSATE SODIUM 8.6-50 MG PO TABS
2.0000 | ORAL_TABLET | Freq: Every day | ORAL | Status: DC
  Administered 2024-08-03: 2 via ORAL
  Filled 2024-08-03: qty 2

## 2024-08-03 NOTE — Progress Notes (Signed)
 " PROGRESS NOTE  Robin [name], is a 58 y.o. female, DOB - 09-04-1965, FMW:[id number]  Admit date - 07/30/2024   Admitting Physician Posey Maier, DO  Outpatient Primary MD for the patient is Job [facility]ukes, [place]  LOS - 3  Chief Complaint  Patient presents with   Altered Mental Status       Brief Summary:- 58 y.o. 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 CKD 3. DM2 And COPD admitted on 07/31/2024 with acute metabolic encephalopathy in the setting of influenza A infection and AKI on CKD 3A. -- As of 08/03/2024 pt remains stable for discharge to SNF facility--- However, SNF facility states they will not take her until at least 5 days after initial diagnosis of influenza and after completing 5 days of Tamiflu  (Dxed with influenza on 07/30/24)--- patient received first dose of Tamiflu  on 08/01/2024, day 5 of Tamiflu  will be 08/05/2024   A/p 1) influenza A--- mentation has improved  -Okay to complete Tamiflu  -- 08/03/24---No new concerns - Remains without fevers - Stable for discharge to SNF facility--- However, SNF facility states they will not take her until at least 5 days after initial diagnosis of influenza and after completing 5 days of Tamiflu  (Dxed with influenza on 07/30/24)--- patient received first dose of Tamiflu  on 08/01/2024, day 5 of Tamiflu  will be 08/05/2024 --continue symptomatic and supportive care   2)AKI----acute kidney injury on CKD stage -3A--due to dehydration in the setting of poor oral intake as a result of altered mentation due to flu -   creatinine on admission= 2.0 ,  baseline creatinine = 1.4-1.5   , -Creatinine is back to baseline -renally adjust medications, avoid nephrotoxic agents / dehydration  / hypotension   3) acute metabolic encephalopathy--due to #1 #2 above - CT head and MRI brain without acute strokes or other acute findings - Abnormal EEG report is noted,   however no active seizures noted - No leukocytosis or evidence of bacterial infection at this time -Blood cultures NGTD, UA not suggestive of UTI -- Urine tox screen negative this admission -Mentation is back to baseline   4)DM2--recent A1c 8.0 reflecting uncontrolled diabetes with hyperglycemia PTA - Hold PTA Jardiance  and Trulicity  as well as Lantus    -Use Novolog /Humalog  Sliding scale insulin  with Accu-Cheks/Fingersticks as ordered    5) chronic systolic dysfunction CHF--echo from July 2024 with EF of 40 to 45% which is improved from echo of April 2023 when EF was 25 to 30% -- History of cocaine abuse with concerns for possible cocaine induced cardiomyopathy---No recent or current drug use -avoid ACEI/ARB/ARNI due to renal concerns - C/n  Coreg  and isosorbide /hydralazine  combo -- Hold Lasix    6)Seizure disorder-- - stopped IV Keppra  and restarted  PTA Trileptal  and Gabapentin  now that patient is able to take oral intake   7)PAFib--continue Coreg  for rate control and Eliquis  for secondary stroke prophylaxis  8)H/o Prior Stroke with Rt Sided Residual Hemiparesis--at baseline patient lives alone with aide services during the day, she is left-hand dominant but she ambulates around the house with a cane - Apixaban  as above in #7, and atorvastatin  for secondary stroke prevention LDL 68--Even if her lipid panel is within desired limits, patient should still take atorvastatin  for it's Pleiotropic effects (beyond cholesterol lowering benefits)  9)Social/Ethics--plan of care discussed with patient's boyfriend of 7 years Robin [name] at bedside, questions answered -- Patient's daughter previously updated -[name] sister -Robin [name]---is Primary Contact/HCPOA--I called and  updated patient's sister  - Patient remains a full code  10)Morbid Obesity- -Low calorie diet, portion control, especially due to residual hemiparesis which limits ability to be physically active discussed with patient -Body mass  index is 40.67 kg/m.  11)HTN--stage II, c/n  carvedilol , isosorbide /hydralazine  combo  Status is: Inpatient   Disposition: The patient is from: Home              Anticipated d/c is to: SNF              Anticipated d/c date is: 1 day              Patient currently is medically stable to d/c. Barriers: -Awaiting insurance approval for SNF placement and nursing home wants to wait for her to complete 5 days of Tamiflu  before they will accept  Code Status : -  Code Status: Full Code   Family Communication:   plan of care discussed with patient's boyfriend of 7 years Robin [name] at bedside [name] sister -Robin [name]---is Primary Contact/HCPOA--I called and updated patient's sister on 08/01/2024  DVT Prophylaxis  :   - SCDs  SCDs Start: 08/02/24 1801 Place TED hose Start: 08/02/24 1801 apixaban  (ELIQUIS ) tablet 5 mg   Lab Results  Component Value Date   PLT 185 08/02/2024   Inpatient Medications  Scheduled Meds:  apixaban   5 mg Oral BID   atorvastatin   20 mg Oral Daily   carvedilol   12.5 mg Oral BID WC   Chlorhexidine  Gluconate Cloth  6 each Topical Q0600   feeding supplement  237 mL Oral BID BM   ferrous sulfate   325 mg Oral Q breakfast   gabapentin   200 mg Oral BID   hydrALAZINE   25 mg Oral TID   insulin  aspart  0-15 Units Subcutaneous Q4H   ipratropium-albuterol   3 mL Nebulization TID   isosorbide  mononitrate  30 mg Oral Daily   mouth rinse  15 mL Mouth Rinse 4 times per day   oseltamivir   30 mg Oral BID   OXcarbazepine   150 mg Oral BID   pantoprazole   40 mg Oral Daily   Continuous Infusions:   PRN Meds:.acetaminophen , acetaminophen , albuterol , labetalol , LORazepam , mouth rinse   Anti-infectives (From admission, onward)    Start     Dose/Rate Route Frequency Ordered Stop   08/01/24 1715  oseltamivir  (TAMIFLU ) capsule 30 mg        30 mg Oral 2 times daily 08/01/24 1618 08/06/24 2159   07/31/24 1000  oseltamivir  (TAMIFLU ) capsule 30 mg  Status:  Discontinued        30 mg  Oral Daily 07/30/24 2354 08/01/24 1121   07/30/24 2245  cefTRIAXone  (ROCEPHIN ) 2 g in sodium chloride  0.9 % 100 mL IVPB        2 g 200 mL/hr over 30 Minutes Intravenous Once 07/30/24 2240 07/30/24 2330   07/30/24 2245  azithromycin  (ZITHROMAX ) 500 mg in sodium chloride  0.9 % 250 mL IVPB        500 mg 250 mL/hr over 60 Minutes Intravenous  Once 07/30/24 2240 07/31/24 0030       Subjective: Inocente Seivert today has no further fevers, no emesis,  No chest pain,    - No new concerns - Remains afebrile - Stable for discharge to SNF facility--- However, SNF facility states they will not take her until at least 5 days after initial diagnosis of influenza and after completing 5 days of Tamiflu  (Dxed with influenza on 07/30/24)--- patient received first dose of  Tamiflu  on 08/01/2024, day 5 of Tamiflu  will be 08/05/2024 --- -Eating and drinking well   Objective: Vitals:   08/03/24 0604 08/03/24 0700 08/03/24 1100 08/03/24 1300  BP:    (!) 135/94  Pulse:    94  Resp:      Temp:  98.4 F (36.9 C) 97.8 F (36.6 C) 97.8 F (36.6 C)  TempSrc:  Oral Oral Oral  SpO2: 98%   98%  Weight:      Height:        Intake/Output Summary (Last 24 hours) at 08/03/2024 1541 Last data filed at 08/03/2024 1000 Gross per 24 hour  Intake 480 ml  Output 1550 ml  Net -1070 ml   Filed Weights   08/01/24 0447 08/02/24 0457 08/03/24 0500  Weight: 112.8 kg 114.6 kg 114.3 kg    Physical Exam Gen:- Awake Alert, morbidly obese, in no apparent distress, baseline expressive aphasia HEENT:- [name].AT, No sclera icterus Neck-Supple Neck,No JVD,.  Lungs-  CTAB , fair symmetrical air movement CV- S1, S2 normal, regular  Abd-  +ve B.Sounds, Abd Soft, No tenderness, increased truncal adiposity Extremity/Skin:- No  edema, pedal pulses present  Psych-affect is appropriate, oriented x3 Neuro-residual right-sided hemiparesis, residual expressive aphasia, no additional new focal deficits, no tremors,, patient is left-hand  dominant at baseline  Data Reviewed: I have personally reviewed following labs and imaging studies  CBC: Recent Labs  Lab 07/30/24 2229 08/02/24 0335  WBC 7.6 3.4*  NEUTROABS 5.8  --   HGB 14.0 12.2  HCT 45.5 39.1  MCV 88.0 86.7  PLT 246 185   Basic Metabolic Panel: Recent Labs  Lab 07/30/24 2229 07/31/24 0050 08/02/24 0335  NA 142  --  139  K 4.2  --  3.8  CL 104  --  106  CO2 23  --  23  GLUCOSE 109*  --  105*  BUN 25*  --  30*  CREATININE 2.00*  --  1.59*  CALCIUM  9.2  --  8.2*  MG  --  2.1  --   PHOS  --  3.2 2.2*   GFR: Estimated Creatinine Clearance: 49.5 mL/min (A) (by C-G formula based on SCr of 1.59 mg/dL (H)). Liver Function Tests: Recent Labs  Lab 07/30/24 2229 08/02/24 0335  AST 21  --   ALT 11  --   ALKPHOS 103  --   BILITOT 0.4  --   PROT 7.4  --   ALBUMIN 4.3 3.1*   Recent Results (from the past 240 hours)  Culture, blood (Routine x 2)     Status: None (Preliminary result)   Collection Time: 07/30/24 10:29 PM   Specimen: BLOOD LEFT FOREARM  Result Value Ref Range Status   Specimen Description BLOOD LEFT FOREARM  Final   Special Requests   Final    BOTTLES DRAWN AEROBIC AND ANAEROBIC Blood Culture results may not be optimal due to an inadequate volume of blood received in culture bottles   Culture   Final    NO GROWTH 4 DAYS Performed at [facility], [address], [place], [name] [address]    Report Status PENDING  Incomplete  Resp panel by RT-PCR (RSV, Flu A&B, Covid) Anterior Nasal Swab     Status: Abnormal   Collection Time: 07/30/24 10:29 PM   Specimen: Anterior Nasal Swab  Result Value Ref Range Status   SARS Coronavirus 2 by RT PCR NEGATIVE NEGATIVE Final    Comment: (NOTE) SARS-CoV-2 target nucleic acids are NOT DETECTED.  The SARS-CoV-2 RNA is generally detectable in upper respiratory specimens during the acute phase of infection. The lowest concentration of SARS-CoV-2 viral copies this assay can detect is 138 copies/mL.  A negative result does not preclude SARS-Cov-2 infection and should not be used as the sole basis for treatment or other patient management decisions. A negative result may occur with  improper specimen collection/handling, submission of specimen other than nasopharyngeal swab, presence of viral mutation(s) within the areas targeted by this assay, and inadequate number of viral copies(<138 copies/mL). A negative result must be combined with clinical observations, patient history, and epidemiological information. The expected result is Negative.  Fact Sheet for Patients:  bloggercourse.com  Fact Sheet for Healthcare Providers:  seriousbroker.it  This test is no t yet approved or cleared by the [place]  FDA and  has been authorized for detection and/or diagnosis of SARS-CoV-2 by FDA under an Emergency Use Authorization (EUA). This EUA will remain  in effect (meaning this test can be used) for the duration of the COVID-19 declaration under Section 564(b)(1) of the Act, 21 U.S.C.section 360bbb-3(b)(1), unless the authorization is terminated  or revoked sooner.       Influenza A by PCR POSITIVE (A) NEGATIVE Final   Influenza B by PCR NEGATIVE NEGATIVE Final    Comment: (NOTE) The Xpert Xpress SARS-CoV-2/FLU/RSV plus assay is intended as an aid in the diagnosis of influenza from Nasopharyngeal swab specimens and should not be used as a sole basis for treatment. Nasal washings and aspirates are unacceptable for Xpert Xpress SARS-CoV-2/FLU/RSV testing.  Fact Sheet for Patients: bloggercourse.com  Fact Sheet for Healthcare Providers: seriousbroker.it  This test is not yet approved or cleared by the [place]  FDA and has been authorized for detection and/or diagnosis of SARS-CoV-2 by FDA under an Emergency Use Authorization (EUA). This EUA will remain in effect (meaning this  test can be used) for the duration of the COVID-19 declaration under Section 564(b)(1) of the Act, 21 U.S.C. section 360bbb-3(b)(1), unless the authorization is terminated or revoked.     Resp Syncytial Virus by PCR NEGATIVE NEGATIVE Final    Comment: (NOTE) Fact Sheet for Patients: bloggercourse.com  Fact Sheet for Healthcare Providers: seriousbroker.it  This test is not yet approved or cleared by the [place]  FDA and has been authorized for detection and/or diagnosis of SARS-CoV-2 by FDA under an Emergency Use Authorization (EUA). This EUA will remain in effect (meaning this test can be used) for the duration of the COVID-19 declaration under Section 564(b)(1) of the Act, 21 U.S.C. section 360bbb-3(b)(1), unless the authorization is terminated or revoked.  Performed at [facility], [address], [place], [name] [address]   Culture, blood (Routine x 2)     Status: None (Preliminary result)   Collection Time: 07/30/24 10:43 PM   Specimen: BLOOD RIGHT HAND  Result Value Ref Range Status   Specimen Description BLOOD RIGHT HAND  Final   Special Requests   Final    BOTTLES DRAWN AEROBIC AND ANAEROBIC Blood Culture results may not be optimal due to an inadequate volume of blood received in culture bottles   Culture   Final    NO GROWTH 4 DAYS Performed at [facility], [address] [place], [name] [address]    Report Status PENDING  Incomplete  MRSA Next Gen by PCR, Nasal     Status: None   Collection Time: 07/31/24  2:28 AM   Specimen: Nasal Mucosa; Nasal Swab  Result Value Ref Range Status  MRSA by PCR Next Gen NOT DETECTED NOT DETECTED Final    Comment: (NOTE) The GeneXpert MRSA Assay (FDA approved for NASAL specimens only), is one component of a comprehensive MRSA colonization surveillance program. It is not intended to diagnose MRSA infection nor to guide or monitor treatment for MRSA infections. Test  performance is not FDA approved in patients less than 26 years old. Performed at [facility], [address], [place], [name] [address]    Radiology Studies: EEG adult Result Date: 08/02/2024 Shelton Arlin KIDD, MD     08/02/2024  5:31 PM Patient Name: Onna Nodal Aronoff MRN: [id number] Epilepsy Attending: Arlin KIDD Shelton Referring Physician/Provider: Jesus America, NP Date: 08/02/2024 Duration: 22.40 mins Patient history: 58yo F with ams. EEG to evaluate for seizure Level of alertness: Awake AEDs during EEG study: None Technical aspects: This EEG study was done with scalp electrodes positioned according to the 10-20 International system of electrode placement. Electrical activity was reviewed with band pass filter of 1-70Hz , sensitivity of 7 uV/mm, display speed of 20mm/sec with a 60Hz  notched filter applied as appropriate. EEG data were recorded continuously and digitally stored.  Video monitoring was available and reviewed as appropriate. Description: The posterior dominant rhythm consists of 7.5 Hz activity of moderate voltage (25-35 uV) seen predominantly in posterior head regions, symmetric and reactive to eye opening and eye closing. Sleep was characterized by vertex waves, sleep spindles (12 to 14 Hz), maximal frontocentral region.  EEG showed continuous sharply contoured 3 to 5 Hz theta- delta slowing in left hemisphere, maximal left frontotemporal region. Additionally there is intermittent 3-5 hz theta- delta slowing in right hemisphere. Sharp waves were noted in left temporal region. Hyperventilation and photic stimulation were not performed.    ABNORMALITY - Sharp waves, left temporal -Continuous slow, left hemisphere, maximal left frontotemporal region -Intermittent slow, right hemisphere  IMPRESSION: This study showed evidence of epileptogenicity arising from left temporal region. Additionally there is cortical dysfunction in left hemisphere, maximal left frontotemporal region consistent with  underlying encephalomalacia. Lastly there is non specific cortical dysfunction in right hemisphere. No seizures were seen throughout the recording.  Priyanka O Yadav    Scheduled Meds:  apixaban   5 mg Oral BID   atorvastatin   20 mg Oral Daily   carvedilol   12.5 mg Oral BID WC   Chlorhexidine  Gluconate Cloth  6 each Topical Q0600   feeding supplement  237 mL Oral BID BM   ferrous sulfate   325 mg Oral Q breakfast   gabapentin   200 mg Oral BID   hydrALAZINE   25 mg Oral TID   insulin  aspart  0-15 Units Subcutaneous Q4H   ipratropium-albuterol   3 mL Nebulization TID   isosorbide  mononitrate  30 mg Oral Daily   mouth rinse  15 mL Mouth Rinse 4 times per day   oseltamivir   30 mg Oral BID   OXcarbazepine   150 mg Oral BID   pantoprazole   40 mg Oral Daily   Continuous Infusions:   LOS: 3 days   Rendall Carwin M.D on 08/03/2024 at 3:41 PM  Go to [email or web address] - for contact info  [facility] - Office  [phone]  If 7PM-7AM, please contact night-coverage [email or web address] 08/03/2024, 3:41 PM    "

## 2024-08-03 NOTE — Plan of Care (Signed)

## 2024-08-04 LAB — GLUCOSE, CAPILLARY
Glucose-Capillary: 165 mg/dL — ABNORMAL HIGH (ref 70–99)
Glucose-Capillary: 109 mg/dL — ABNORMAL HIGH (ref 70–99)
Glucose-Capillary: 105 mg/dL — ABNORMAL HIGH (ref 70–99)

## 2024-08-04 LAB — CULTURE, BLOOD (ROUTINE X 2)
Culture: NO GROWTH
Culture: NO GROWTH

## 2024-08-04 MED ORDER — OXCARBAZEPINE 300 MG PO TABS: 150.0000 mg | ORAL_TABLET | Freq: Two times a day (BID) | ORAL | Status: AC

## 2024-08-04 MED ORDER — INSULIN LISPRO (1 UNIT DIAL) 100 UNIT/ML (KWIKPEN): 2.0000 [IU] | PEN_INJECTOR | Freq: Three times a day (TID) | SUBCUTANEOUS | Status: DC

## 2024-08-04 MED ORDER — ATORVASTATIN CALCIUM 20 MG PO TABS: 20.0000 mg | ORAL_TABLET | Freq: Every day | ORAL | 0 refills | Status: DC

## 2024-08-04 MED ORDER — TRULICITY 4.5 MG/0.5ML ~~LOC~~ SOAJ: 4.5000 mg | SUBCUTANEOUS | Status: DC

## 2024-08-04 MED ORDER — OSELTAMIVIR PHOSPHATE 30 MG PO CAPS: 30.0000 mg | ORAL_CAPSULE | Freq: Two times a day (BID) | ORAL | 0 refills | Status: DC

## 2024-08-04 NOTE — TOC Transition Note (Signed)
 Transition of Care [facility]) - Discharge Note   Patient Details  Name: Robin [name] MRN: [id number] Date of Birth: 30-Sep-1965  [facility] CM/SW Contact:  Sharlyne Stabs, RN Phone Number: 08/04/2024, 2:03 PM   Clinical Narrative:  No bed offer due to Flu. IPCM/ RN spoke with her sister. They are willing to take the patient home. They have DME needs and caregivers plus family to assist. Team updated. Patient will discharge home.      Final next level of care: Home/Self Care Barriers to Discharge: Barriers Resolved   Patient Goals and CMS Choice Patient states their goals for this hospitalization and ongoing recovery are:: return home CMS Medicare.gov Compare Post Acute Care list provided to:: Patient Represenative (must comment) Choice offered to / list presented to : Patient, Sibling      Discharge Placement                  Name of family member notified: sister Patient and family notified of of transfer: 08/04/24  Discharge Plan and Services Additional resources added to the After Visit Summary for   In-house Referral: Clinical Social Work Discharge Planning Services: CM Consult Post Acute Care Choice: Resumption of Svcs/PTA Provider                  Social Drivers of Health (SDOH) Interventions SDOH Screenings   Food Insecurity: Patient Unable To Answer (07/31/2024)  Housing: Patient Unable To Answer (07/31/2024)  Transportation Needs: Patient Unable To Answer (07/31/2024)  Utilities: Patient Unable To Answer (07/31/2024)  Alcohol  Screen: Low Risk (12/10/2023)  Depression (PHQ2-9): Low Risk (12/10/2023)  Financial Resource Strain: Low Risk (12/10/2023)  Physical Activity: Sufficiently Active (12/10/2023)  Social Connections: Moderately Isolated (12/10/2023)  Stress: No Stress Concern Present (12/10/2023)  Tobacco Use: Medium Risk (07/30/2024)  Health Literacy: Adequate Health Literacy (12/10/2023)  Recent Concern: Health Literacy - Medium Risk (09/18/2023)    Received from [facility]    Readmission Risk Interventions    08/01/2024    1:45 PM 09/26/2023   10:11 AM 09/24/2023    2:14 PM  Readmission Risk Prevention Plan  Transportation Screening Complete Complete Complete  HRI or Home Care Consult Complete    Social Work Consult for Recovery Care Planning/Counseling Complete    Palliative Care Screening Not Applicable    Medication Review Oceanographer) Complete Complete Complete  HRI or Home Care Consult  Complete Complete  SW Recovery Care/Counseling Consult  Complete Complete  Palliative Care Screening  Not Applicable Not Applicable  Skilled Nursing Facility  Not Applicable Not Applicable

## 2024-08-04 NOTE — TOC Progression Note (Signed)
 Transition of Care [facility]) - Progression Note    Patient Details  Name: Robin [name] MRN: [id number] Date of Birth: 09-16-1965  [facility] CM/SW Contact  Sharlyne Stabs, RN Phone Number: 08/04/2024, 10:48 AM  Clinical Narrative:   [facility] received 12/30 - 1/5, next review 1/5, mayme shara id [id number]. Patient is Flu positive and no bed offer. [facility] can not admit until 1/3 if they have a bed. Bed search expanded. Patient confused and can not return home alone. Bed will need to be added to Auth. IPCM following.     Expected Discharge Plan: Skilled Nursing Facility Barriers to Discharge: No SNF bed    Expected Discharge Plan and Services In-house Referral: Clinical Social Work Discharge Planning Services: CM Consult Post Acute Care Choice: Resumption of Svcs/PTA Provider Living arrangements for the past 2 months: Apartment Expected Discharge Date: 08/03/24                    Social Drivers of Health (SDOH) Interventions SDOH Screenings   Food Insecurity: Patient Unable To Answer (07/31/2024)  Housing: Patient Unable To Answer (07/31/2024)  Transportation Needs: Patient Unable To Answer (07/31/2024)  Utilities: Patient Unable To Answer (07/31/2024)  Alcohol  Screen: Low Risk (12/10/2023)  Depression (PHQ2-9): Low Risk (12/10/2023)  Financial Resource Strain: Low Risk (12/10/2023)  Physical Activity: Sufficiently Active (12/10/2023)  Social Connections: Moderately Isolated (12/10/2023)  Stress: No Stress Concern Present (12/10/2023)  Tobacco Use: Medium Risk (07/30/2024)  Health Literacy: Adequate Health Literacy (12/10/2023)  Recent Concern: Health Literacy - Medium Risk (09/18/2023)   Received from [facility]    Readmission Risk Interventions    08/01/2024    1:45 PM 09/26/2023   10:11 AM 09/24/2023    2:14 PM  Readmission Risk Prevention Plan  Transportation Screening Complete Complete Complete  HRI or Home Care Consult Complete    Social Work Consult for Recovery  Care Planning/Counseling Complete    Palliative Care Screening Not Applicable    Medication Review Oceanographer) Complete Complete Complete  HRI or Home Care Consult  Complete Complete  SW Recovery Care/Counseling Consult  Complete Complete  Palliative Care Screening  Not Applicable Not Applicable  Skilled Nursing Facility  Not Applicable Not Applicable

## 2024-08-04 NOTE — Progress Notes (Addendum)
 " PROGRESS NOTE    Robin [name]  FMW:[id number] DOB: 1965/08/15 DOA: 07/30/2024 PCP: Job Lukes, PA   Brief Narrative:   58 y.o. 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 CKD 3. DM2 And COPD admitted on 07/31/2024 with acute metabolic encephalopathy in the setting of influenza A infection and AKI on CKD 3A. -- As of 08/03/2024 pt remains stable for discharge to SNF facility--- However, SNF facility states they will not take her until at least 5 days after initial diagnosis of influenza and after completing 5 days of Tamiflu  (Dxed with influenza on 07/30/24)--- patient received first dose of Tamiflu  on 08/01/2024, day 5 of Tamiflu  will be 08/05/2024  Assessment & Plan:  Principal Problem:   Acute metabolic encephalopathy Active Problems:   GERD (gastroesophageal reflux disease)   Type 2 diabetes mellitus with diabetic autonomic neuropathy, with long-term current use of insulin  (HCC)   Embolic cerebral infarction [facility]) s/p clot retrieval   Chronic diastolic congestive heart failure (HCC)   Atrial fibrillation, chronic (HCC)   Stage 3b chronic kidney disease (CKD) (HCC)    Influenza A-- -Okay to complete Tamiflu  - Remains without fevers - Stable for discharge to SNF facility--- However, SNF facility states they will not take her until at least 5 days after initial diagnosis of influenza and after completing 5 days of Tamiflu  (Dxed with influenza on 07/30/24)--- patient received first dose of Tamiflu  on 08/01/2024, day 5 of Tamiflu  will be 08/05/2024 --continue symptomatic and supportive care   AKI--acute kidney injury on CKD stage -3A--due to dehydration in the setting of poor oral intake as a result of altered mentation due to flu -   creatinine on admission= 2.0 ,  baseline creatinine = 1.4-1.5   , -Creatinine is back to baseline -renally adjust medications, avoid nephrotoxic agents /  dehydration  / hypotension   Acute metabolic encephalopathy - CT head and MRI brain without acute strokes or other acute findings - Abnormal EEG report is noted,  however no active seizures noted - No leukocytosis or evidence of bacterial infection at this time -Blood cultures NGTD, UA not suggestive of UTI -- Urine tox screen negative this admission -Mentation is back to baseline   DM2--recent A1c 8.0 reflecting uncontrolled diabetes with hyperglycemia PTA - Hold PTA Jardiance  and Trulicity  as well as Lantus    -Use Novolog /Humalog  Sliding scale insulin  with Accu-Cheks/Fingersticks as ordered    5) chronic systolic CHF--echo from July 2024 with EF of 40 to 45% which is improved from echo of April 2023 when EF was 25 to 30% -- History of cocaine abuse with concerns for possible cocaine induced cardiomyopathy- -avoid ACEI/ARB/ARNI due to renal concerns - C/n  Coreg  and isosorbide /hydralazine  combo -- Hold Lasix    Seizure disorder-- - stopped IV Keppra  and restarted  PTA Trileptal  and Gabapentin  now that patient is able to take oral intake   Paroxysmal fib--continue Coreg  for rate control and Eliquis  for secondary stroke prophylaxis   H/o Prior Stroke with Rt Sided Residual Hemiparesis--at baseline patient lives alone with aide services during the day, she is left-hand dominant but she ambulates around the house with a cane - Apixaban  as above in #7, and atorvastatin  for secondary stroke prevention LDL 68--Even if her lipid panel is within desired limits, patient should still take atorvastatin  for it's Pleiotropic effects (beyond cholesterol lowering benefits)    Class III obesity: -As evidenced by BMI of 40.5  HTN: Continue with carvedilol , isosorbide /hydralazine  combo  Disposition: Pending placement to short-term rehab/[facility]  DVT prophylaxis: SCDs Start: 08/02/24 1801 Place TED hose Start: 08/02/24 1801 apixaban  (ELIQUIS ) tablet 5 mg     Code Status: Full Code Family  Communication:  None at the bedside Status is: Inpatient Remains inpatient appropriate because: Pending placement    Subjective:  No acute events overnight.  Hemodynamically stable.  Pending placement to short-term rehab.  Examination:  General exam: Appears calm and comfortable, she does have word finding difficulties  Respiratory system: Clear to auscultation. Respiratory effort normal. Cardiovascular system: S1 & S2 heard, RRR. No JVD, murmurs, rubs, gallops or clicks. No pedal edema. Gastrointestinal system: Abdomen is nondistended, soft and nontender. No organomegaly or masses felt. Normal bowel sounds heard. Central nervous system: Alert and oriented x 1. No focal neurological deficits. Extremities: Symmetric 5 x 5 power. Skin: No rashes, lesions or ulcers      Diet Orders (From admission, onward)     Start     Ordered   08/01/24 1554  DIET DYS 3 Room service appropriate? Yes; Fluid consistency: Thin  Diet effective now       Question Answer Comment  Room service appropriate? Yes   Fluid consistency: Thin      08/01/24 1553            Objective: Vitals:   08/04/24 0003 08/04/24 0625 08/04/24 0752 08/04/24 0834  BP: (!) 147/98 (!) 150/95  (!) 150/97  Pulse: 84 89  89  Resp: 17 17    Temp: 98.1 F (36.7 C) 98.2 F (36.8 C)    TempSrc: Oral Oral    SpO2: 97% 99% 97%   Weight:  114 kg    Height:        Intake/Output Summary (Last 24 hours) at 08/04/2024 0937 Last data filed at 08/04/2024 0500 Gross per 24 hour  Intake 480 ml  Output 1100 ml  Net -620 ml   Filed Weights   08/02/24 0457 08/03/24 0500 08/04/24 0625  Weight: 114.6 kg 114.3 kg 114 kg    Scheduled Meds:  apixaban   5 mg Oral BID   atorvastatin   20 mg Oral Daily   carvedilol   12.5 mg Oral BID WC   Chlorhexidine  Gluconate Cloth  6 each Topical Q0600   feeding supplement  237 mL Oral BID BM   ferrous sulfate   325 mg Oral Q breakfast   gabapentin   200 mg Oral BID   hydrALAZINE   25 mg  Oral TID   insulin  aspart  0-15 Units Subcutaneous Q4H   ipratropium-albuterol   3 mL Nebulization TID   isosorbide  mononitrate  30 mg Oral Daily   mouth rinse  15 mL Mouth Rinse 4 times per day   oseltamivir   30 mg Oral BID   OXcarbazepine   150 mg Oral BID   pantoprazole   40 mg Oral Daily   senna-docusate  2 tablet Oral QHS   Continuous Infusions:  Nutritional status     Body mass index is 40.56 kg/m.  Data Reviewed:   CBC: Recent Labs  Lab 07/30/24 2229 08/02/24 0335  WBC 7.6 3.4*  NEUTROABS 5.8  --   HGB 14.0 12.2  HCT 45.5 39.1  MCV 88.0 86.7  PLT 246 185   Basic Metabolic Panel: Recent Labs  Lab 07/30/24 2229 07/31/24 0050 08/02/24 0335  NA 142  --  139  K 4.2  --  3.8  CL 104  --  106  CO2 23  --  23  GLUCOSE 109*  --  105*  BUN 25*  --  30*  CREATININE 2.00*  --  1.59*  CALCIUM  9.2  --  8.2*  MG  --  2.1  --   PHOS  --  3.2 2.2*   GFR: Estimated Creatinine Clearance: 49.4 mL/min (A) (by C-G formula based on SCr of 1.59 mg/dL (H)). Liver Function Tests: Recent Labs  Lab 07/30/24 2229 08/02/24 0335  AST 21  --   ALT 11  --   ALKPHOS 103  --   BILITOT 0.4  --   PROT 7.4  --   ALBUMIN 4.3 3.1*   No results for input(s): LIPASE, AMYLASE in the last 168 hours. No results for input(s): AMMONIA in the last 168 hours. Coagulation Profile: Recent Labs  Lab 07/30/24 2316  INR 1.5*   Cardiac Enzymes: No results for input(s): CKTOTAL, CKMB, CKMBINDEX, TROPONINI in the last 168 hours. BNP (last 3 results) No results for input(s): PROBNP in the last 8760 hours. HbA1C: No results for input(s): HGBA1C in the last 72 hours. CBG: Recent Labs  Lab 08/03/24 1618 08/03/24 2026 08/03/24 2313 08/04/24 0407 08/04/24 0736  GLUCAP 124* 162* 141* 165* 109*   Lipid Profile: No results for input(s): CHOL, HDL, LDLCALC, TRIG, CHOLHDL, LDLDIRECT in the last 72 hours. Thyroid Function Tests: No results for input(s): TSH,  T4TOTAL, FREET4, T3FREE, THYROIDAB in the last 72 hours. Anemia Panel: No results for input(s): VITAMINB12, FOLATE, FERRITIN, TIBC, IRON, RETICCTPCT in the last 72 hours. Sepsis Labs: Recent Labs  Lab 07/30/24 2229 07/31/24 0050  LATICACIDVEN 1.0 1.8    Recent Results (from the past 240 hours)  Culture, blood (Routine x 2)     Status: None   Collection Time: 07/30/24 10:29 PM   Specimen: BLOOD LEFT FOREARM  Result Value Ref Range Status   Specimen Description BLOOD LEFT FOREARM  Final   Special Requests   Final    BOTTLES DRAWN AEROBIC AND ANAEROBIC Blood Culture results may not be optimal due to an inadequate volume of blood received in culture bottles   Culture   Final    NO GROWTH 5 DAYS Performed at [facility], [address] [place], [name] [address]    Report Status 08/04/2024 FINAL  Final  Resp panel by RT-PCR (RSV, Flu A&B, Covid) Anterior Nasal Swab     Status: Abnormal   Collection Time: 07/30/24 10:29 PM   Specimen: Anterior Nasal Swab  Result Value Ref Range Status   SARS Coronavirus 2 by RT PCR NEGATIVE NEGATIVE Final    Comment: (NOTE) SARS-CoV-2 target nucleic acids are NOT DETECTED.  The SARS-CoV-2 RNA is generally detectable in upper respiratory specimens during the acute phase of infection. The lowest concentration of SARS-CoV-2 viral copies this assay can detect is 138 copies/mL. A negative result does not preclude SARS-Cov-2 infection and should not be used as the sole basis for treatment or other patient management decisions. A negative result may occur with  improper specimen collection/handling, submission of specimen other than nasopharyngeal swab, presence of viral mutation(s) within the areas targeted by this assay, and inadequate number of viral copies(<138 copies/mL). A negative result must be combined with clinical observations, patient history, and epidemiological information. The expected result is Negative.  Fact  Sheet for Patients:  bloggercourse.com  Fact Sheet for Healthcare Providers:  seriousbroker.it  This test is no t yet approved or cleared by the [place]  FDA and  has been authorized for detection and/or diagnosis  of SARS-CoV-2 by FDA under an Emergency Use Authorization (EUA). This EUA will remain  in effect (meaning this test can be used) for the duration of the COVID-19 declaration under Section 564(b)(1) of the Act, 21 U.S.C.section 360bbb-3(b)(1), unless the authorization is terminated  or revoked sooner.       Influenza A by PCR POSITIVE (A) NEGATIVE Final   Influenza B by PCR NEGATIVE NEGATIVE Final    Comment: (NOTE) The Xpert Xpress SARS-CoV-2/FLU/RSV plus assay is intended as an aid in the diagnosis of influenza from Nasopharyngeal swab specimens and should not be used as a sole basis for treatment. Nasal washings and aspirates are unacceptable for Xpert Xpress SARS-CoV-2/FLU/RSV testing.  Fact Sheet for Patients: bloggercourse.com  Fact Sheet for Healthcare Providers: seriousbroker.it  This test is not yet approved or cleared by the [place]  FDA and has been authorized for detection and/or diagnosis of SARS-CoV-2 by FDA under an Emergency Use Authorization (EUA). This EUA will remain in effect (meaning this test can be used) for the duration of the COVID-19 declaration under Section 564(b)(1) of the Act, 21 U.S.C. section 360bbb-3(b)(1), unless the authorization is terminated or revoked.     Resp Syncytial Virus by PCR NEGATIVE NEGATIVE Final    Comment: (NOTE) Fact Sheet for Patients: bloggercourse.com  Fact Sheet for Healthcare Providers: seriousbroker.it  This test is not yet approved or cleared by the [place]  FDA and has been authorized for detection and/or diagnosis of SARS-CoV-2 by FDA  under an Emergency Use Authorization (EUA). This EUA will remain in effect (meaning this test can be used) for the duration of the COVID-19 declaration under Section 564(b)(1) of the Act, 21 U.S.C. section 360bbb-3(b)(1), unless the authorization is terminated or revoked.  Performed at [facility], [address] [place], [name] [address]   Culture, blood (Routine x 2)     Status: None   Collection Time: 07/30/24 10:43 PM   Specimen: BLOOD RIGHT HAND  Result Value Ref Range Status   Specimen Description BLOOD RIGHT HAND  Final   Special Requests   Final    BOTTLES DRAWN AEROBIC AND ANAEROBIC Blood Culture results may not be optimal due to an inadequate volume of blood received in culture bottles   Culture   Final    NO GROWTH 5 DAYS Performed at [facility], [address], [place], [name] [address]    Report Status 08/04/2024 FINAL  Final  MRSA Next Gen by PCR, Nasal     Status: None   Collection Time: 07/31/24  2:28 AM   Specimen: Nasal Mucosa; Nasal Swab  Result Value Ref Range Status   MRSA by PCR Next Gen NOT DETECTED NOT DETECTED Final    Comment: (NOTE) The GeneXpert MRSA Assay (FDA approved for NASAL specimens only), is one component of a comprehensive MRSA colonization surveillance program. It is not intended to diagnose MRSA infection nor to guide or monitor treatment for MRSA infections. Test performance is not FDA approved in patients less than 37 years old. Performed at [facility], [address], [place], [name] [address]          Radiology Studies: EEG adult Result Date: 08/02/2024 Shelton Arlin KIDD, MD     08/02/2024  5:31 PM Patient Name: Shayma Pfefferle Bagsby MRN: [id number] Epilepsy Attending: Arlin KIDD Shelton Referring Physician/Provider: Jesus America, NP Date: 08/02/2024 Duration: 22.40 mins Patient history: 59yo F with ams. EEG to evaluate for seizure Level of alertness: Awake AEDs during EEG study: None Technical aspects:  This EEG study was done with  scalp electrodes positioned according to the 10-20 International system of electrode placement. Electrical activity was reviewed with band pass filter of 1-70Hz , sensitivity of 7 uV/mm, display speed of 55mm/sec with a 60Hz  notched filter applied as appropriate. EEG data were recorded continuously and digitally stored.  Video monitoring was available and reviewed as appropriate. Description: The posterior dominant rhythm consists of 7.5 Hz activity of moderate voltage (25-35 uV) seen predominantly in posterior head regions, symmetric and reactive to eye opening and eye closing. Sleep was characterized by vertex waves, sleep spindles (12 to 14 Hz), maximal frontocentral region.  EEG showed continuous sharply contoured 3 to 5 Hz theta- delta slowing in left hemisphere, maximal left frontotemporal region. Additionally there is intermittent 3-5 hz theta- delta slowing in right hemisphere. Sharp waves were noted in left temporal region. Hyperventilation and photic stimulation were not performed.    ABNORMALITY - Sharp waves, left temporal -Continuous slow, left hemisphere, maximal left frontotemporal region -Intermittent slow, right hemisphere  IMPRESSION: This study showed evidence of epileptogenicity arising from left temporal region. Additionally there is cortical dysfunction in left hemisphere, maximal left frontotemporal region consistent with underlying encephalomalacia. Lastly there is non specific cortical dysfunction in right hemisphere. No seizures were seen throughout the recording.  Priyanka MALVA Krebs            LOS: 4 days   Time spent= 35 mins    Deliliah Room, MD [facility]  If 7PM-7AM, please contact night-coverage  08/04/2024, 9:37 AM  "

## 2024-08-04 NOTE — Discharge Summary (Signed)
 " Physician Discharge Summary   Patient: Robin [name] MRN: [id number] DOB: 16-May-1966  Admit date:     07/30/2024  Discharge date: 08/04/2024  Discharge Physician: Deliliah Room   PCP: Job Lukes, PA   Recommendations at discharge:    F/u with your PCP in one week Continue taking meds as prescribed  Discharge Diagnoses: Principal Problem:   Acute metabolic encephalopathy Active Problems:   GERD (gastroesophageal reflux disease)   Type 2 diabetes mellitus with diabetic autonomic neuropathy, with long-term current use of insulin  (HCC)   Embolic cerebral infarction [facility]) s/p clot retrieval   Chronic diastolic congestive heart failure (HCC)   Atrial fibrillation, chronic (HCC)   Stage 3b chronic kidney disease (CKD) [facility])    Hospital Course: 58 y.o. 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 CKD 3. DM2 And COPD admitted on 07/31/2024 with acute metabolic encephalopathy in the setting of influenza A infection and AKI on CKD 3A.    Influenza A-- -Tamiflu  course will be completed on 08/05/24 - Remains without fevers --continue symptomatic and supportive care   AKI--acute kidney injury on CKD stage -3A--due to dehydration in the setting of poor oral intake as a result of altered mentation due to flu -   creatinine on admission= 2.0 ,  baseline creatinine = 1.4-1.5   , -Creatinine is back to baseline -renally adjust medications, avoid nephrotoxic agents / dehydration  / hypotension -Restarted lasix  on discharge.   Acute metabolic encephalopathy - CT head and MRI brain without acute strokes or other acute findings - Abnormal EEG report is noted,  however no active seizures noted - No leukocytosis or evidence of bacterial infection at this time -Blood cultures NGTD, UA not suggestive of UTI -- Urine tox screen negative this admission -Mentation is back to baseline.   DM2--recent A1c  8.0 reflecting uncontrolled diabetes with hyperglycemia PTA - Dced lantus . Reduced the dose of short acting pre-meal scheduled insulin . -Advised to check blood glucose levels closely Outpatient follow up with PCP for insulin  regimen adjustments   5) chronic systolic CHF--echo from July 2024 with EF of 40 to 45% which is improved from echo of April 2023 when EF was 25 to 30% -- History of cocaine abuse with concerns for possible cocaine induced cardiomyopathy- -avoid ACEI/ARB/ARNI due to renal concerns - C/n  Coreg  and isosorbide /hydralazine  combo -- Continue lasix     Seizure disorder-- - ContinueTrileptal and Gabapentin    Paroxysmal fib--continue Coreg  for rate control and Eliquis  for secondary stroke prophylaxis   H/o Prior Stroke with Rt Sided Residual Hemiparesis--at baseline patient lives alone with aide services during the day, she is left-hand dominant but she ambulates around the house with a cane - Apixaban  as above in #7, and atorvastatin  for secondary stroke prevention LDL 68--Even if her lipid panel is within desired limits, patient should still take atorvastatin  for it's Pleiotropic effects (beyond cholesterol lowering benefits)     Class III obesity: -As evidenced by BMI of 40.5     HTN: Continue with carvedilol , isosorbide /hydralazine  combo   Disposition: Home. Family will assist. Nurse, Mardy and Case manager, Asberry spoke to patient's sister on discharge day. She has in home help from 9am-1pm then 4pm into the evening      Consultants: None Procedures performed: None  Disposition: Home Diet recommendation:  Dysphagia type 3 Thin Liquid DISCHARGE MEDICATION: Allergies as of 08/04/2024       Reactions   Bee Venom  Shortness Of Breath, Swelling   Bodily Swelling   Losartan  Other (See Comments)   Nosebleeds   Naproxen  Other (See Comments)   Acid reflux   Lisinopril  Other (See Comments)   Sinus congestion   Penicillins Nausea Only        Medication  List     STOP taking these medications    Lantus  SoloStar 100 UNIT/ML Solostar Pen Generic drug: insulin  glargine       TAKE these medications    Accu-Chek Guide Me w/Device Kit 1 Device by Does not apply route 3 (three) times daily.   Accu-Chek Guide Test test strip Generic drug: glucose blood USE 1 STRIP TO CHECK GLUCOSE THREE TIMES DAILY   Accu-Chek Softclix Lancets lancets Use as instructed   apixaban  5 MG Tabs tablet Commonly known as: Eliquis  Take 1 tablet (5 mg total) by mouth 2 (two) times daily.   aspirin  EC 81 MG tablet Take 1 tablet (81 mg total) by mouth daily. Swallow whole.   atorvastatin  20 MG tablet Commonly known as: LIPITOR Take 1 tablet (20 mg total) by mouth daily. Start taking on: August 05, 2024   BD Pen Needle Nano U/F 32G X 4 MM Misc Generic drug: Insulin  Pen Needle Inject 1 Device into the skin in the morning, at noon, in the evening, and at bedtime.   carvedilol  25 MG tablet Commonly known as: COREG  TAKE 2 TABLETS BY MOUTH TWICE DAILY WITH A MEAL   empagliflozin  25 MG Tabs tablet Commonly known as: Jardiance  Take 1 tablet (25 mg total) by mouth daily before breakfast.   furosemide  20 MG tablet Commonly known as: LASIX  TAKE 1 TABLET BY MOUTH ONCE DAILY (MAY TAKE ADDITIONAL 20 MG IF WEIGHT GAIN IS OVER 2 LBS OVERNIGHT OR 5 LBS IN A WEEK)   gabapentin  100 MG capsule Commonly known as: NEURONTIN  TAKE 2 CAPSULES BY MOUTH THREE TIMES DAILY   hydrALAZINE  25 MG tablet Commonly known as: APRESOLINE  TAKE 1 TABLET BY MOUTH THREE TIMES DAILY   insulin  lispro 100 UNIT/ML KwikPen Commonly known as: HumaLOG  KwikPen Inject 2 Units into the skin 3 (three) times daily. What changed: how much to take   Iron 325 (65 Fe) MG Tabs Take 1 tablet by mouth once daily   isosorbide  mononitrate 30 MG 24 hr tablet Commonly known as: IMDUR  Take 2 tablets by mouth once daily   oseltamivir  30 MG capsule Commonly known as: TAMIFLU  Take 1 capsule (30 mg  total) by mouth 2 (two) times daily.   Oxcarbazepine  300 MG tablet Commonly known as: Trileptal  Take 0.5 tablets (150 mg total) by mouth 2 (two) times daily.   pantoprazole  40 MG tablet Commonly known as: PROTONIX  Take 1 tablet by mouth once daily   timolol  0.5 % ophthalmic solution Commonly known as: TIMOPTIC  Place 1 drop into both eyes 2 (two) times daily.   tobramycin  0.3 % ophthalmic solution Commonly known as: TOBREX  Place 1 drop into both eyes 4 (four) times daily.   Trulicity  4.5 MG/0.5ML Soaj Generic drug: Dulaglutide  Inject 4.5 mg into the skin every Tuesday. Start taking on: August 10, 2024        Follow-up Information     Job Lukes, [place]. Schedule an appointment as soon as possible for a visit in 1 week(s).   Specialty: Physician Assistant Contact information: [address] [name] [address] [phone]                Discharge Exam: Fredricka Weights   08/02/24  9542 08/03/24 0500 08/04/24 0625  Weight: 114.6 kg 114.3 kg 114 kg   General exam: Appears calm and comfortable, she does have word finding difficulties  Respiratory system: Clear to auscultation. Respiratory effort normal. Cardiovascular system: S1 & S2 heard, RRR. No JVD, murmurs, rubs, gallops or clicks. No pedal edema. Gastrointestinal system: Abdomen is nondistended, soft and nontender. No organomegaly or masses felt. Normal bowel sounds heard. Central nervous system: Alert and oriented x 1. No focal neurological deficits. Extremities: Symmetric 5 x 5 power. Skin: No rashes, lesions or ulcers  Condition at discharge: good  The results of significant diagnostics from this hospitalization (including imaging, microbiology, ancillary and laboratory) are listed below for reference.   Imaging Studies: EEG adult Result Date: 08/02/2024 Shelton Arlin KIDD, MD     08/02/2024  5:31 PM Patient Name: Velma Agnes Hilgeman MRN: [id number] Epilepsy Attending: Arlin KIDD Shelton Referring  Physician/Provider: Jesus America, NP Date: 08/02/2024 Duration: 22.40 mins Patient history: 58yo F with ams. EEG to evaluate for seizure Level of alertness: Awake AEDs during EEG study: None Technical aspects: This EEG study was done with scalp electrodes positioned according to the 10-20 International system of electrode placement. Electrical activity was reviewed with band pass filter of 1-70Hz , sensitivity of 7 uV/mm, display speed of 49mm/sec with a 60Hz  notched filter applied as appropriate. EEG data were recorded continuously and digitally stored.  Video monitoring was available and reviewed as appropriate. Description: The posterior dominant rhythm consists of 7.5 Hz activity of moderate voltage (25-35 uV) seen predominantly in posterior head regions, symmetric and reactive to eye opening and eye closing. Sleep was characterized by vertex waves, sleep spindles (12 to 14 Hz), maximal frontocentral region.  EEG showed continuous sharply contoured 3 to 5 Hz theta- delta slowing in left hemisphere, maximal left frontotemporal region. Additionally there is intermittent 3-5 hz theta- delta slowing in right hemisphere. Sharp waves were noted in left temporal region. Hyperventilation and photic stimulation were not performed.    ABNORMALITY - Sharp waves, left temporal -Continuous slow, left hemisphere, maximal left frontotemporal region -Intermittent slow, right hemisphere  IMPRESSION: This study showed evidence of epileptogenicity arising from left temporal region. Additionally there is cortical dysfunction in left hemisphere, maximal left frontotemporal region consistent with underlying encephalomalacia. Lastly there is non specific cortical dysfunction in right hemisphere. No seizures were seen throughout the recording.  Arlin KIDD Shelton    MR BRAIN WO CONTRAST Result Date: 07/31/2024 EXAM: MRI BRAIN WITHOUT CONTRAST 07/31/2024 11:14:45 AM TECHNIQUE: Multiplanar multisequence MRI of the head/brain was  performed without the administration of intravenous contrast. COMPARISON: Same day CT head and MRI brain 09/07/2023. CLINICAL HISTORY: Mental status change, unknown cause. FINDINGS: BRAIN AND VENTRICLES: No acute infarct. No acute intracranial hemorrhage. No mass. No midline shift. No hydrocephalus. The sella is unremarkable. Normal flow voids. There is encephalomalacia within the left MCA territory, particularly involving the anterolateral left frontal lobe, the anterior aspect of the left superior temporal gyrus, and the left insula. Associated gliosis is present. These findings are compatible with a remote left MCA territory infarct. Similar appearance of chronic blood products is noted along the region of this remote infarct. There is a focal outpouching along the distal left M1 segment which may reflect a left MCA bifurcation aneurysm (series 10, image 14). Associated ex vacuo dilatation of the left lateral ventricle is present. A similar small remote cortical infarct involves the left postcentral gyrus. ORBITS: No acute abnormality. SINUSES AND MASTOIDS: No acute abnormality. BONES AND SOFT  TISSUES: Normal marrow signal. No acute soft tissue abnormality. IMPRESSION: 1. No acute findings. 2. Remote left MCA territory infarct with associated encephalomalacia, gliosis, and chronic blood products. 3. Small remote cortical infarct involving the left postcentral gyrus. 4. Focal outpouching along the distal left M1 segment, possibly representing a left MCA bifurcation aneurysm. Electronically signed by: Donnice Mania MD 07/31/2024 11:52 AM EST RP Workstation: [id number]   CT Head Wo Contrast Result Date: 07/31/2024 EXAM: CT HEAD WITHOUT CONTRAST 07/31/2024 01:11:29 AM TECHNIQUE: CT of the head was performed without the administration of intravenous contrast. Automated exposure control, iterative reconstruction, and/or weight based adjustment of the mA/kV was utilized to reduce the radiation dose to as low as  reasonably achievable. COMPARISON: 09/24/2023 CT head CLINICAL HISTORY: Mental status change, unknown cause FINDINGS: BRAIN AND VENTRICLES: No acute hemorrhage. No evidence of acute infarct. Unchanged remote left frontal infarct. No hydrocephalus. No extra-axial collection. No mass effect or midline shift. ORBITS: No acute abnormality. SINUSES: No acute abnormality. SOFT TISSUES AND SKULL: No acute soft tissue abnormality. No skull fracture. IMPRESSION: 1. No acute intracranial abnormality. 2. Unchanged remote left frontal infarct. Electronically signed by: Gilmore Molt 07/31/2024 01:22 AM EST RP Workstation: [id number]   DG Chest Port 1 View if patient is in a treatment room. Result Date: 07/30/2024 EXAM: 1 VIEW XRAY OF THE CHEST 07/30/2024 10:42:22 PM COMPARISON: Chest X-ray dated 09/23/2023 and CT Chest dated 09/23/2023. CLINICAL HISTORY: Suspected Sepsis FINDINGS: LUNGS AND PLEURA: No focal pulmonary opacity. No pleural effusion. No pneumothorax. HEART AND MEDIASTINUM: Cardiomegaly. Prominent hilar vasculature. The mediastinal silhouette is unchanged compared to the prior study. BONES AND SOFT TISSUES: No acute osseous abnormality. IMPRESSION: 1. No acute cardiopulmonary abnormality. Electronically signed by: Morgane Naveau MD 07/30/2024 11:21 PM EST RP Workstation: [id number]    Microbiology: Results for orders placed or performed during the hospital encounter of 07/30/24  Culture, blood (Routine x 2)     Status: None   Collection Time: 07/30/24 10:29 PM   Specimen: BLOOD LEFT FOREARM  Result Value Ref Range Status   Specimen Description BLOOD LEFT FOREARM  Final   Special Requests   Final    BOTTLES DRAWN AEROBIC AND ANAEROBIC Blood Culture results may not be optimal due to an inadequate volume of blood received in culture bottles   Culture   Final    NO GROWTH 5 DAYS Performed at [facility], [address] [place], [name] [address]    Report Status 08/04/2024 FINAL  Final  Resp  panel by RT-PCR (RSV, Flu A&B, Covid) Anterior Nasal Swab     Status: Abnormal   Collection Time: 07/30/24 10:29 PM   Specimen: Anterior Nasal Swab  Result Value Ref Range Status   SARS Coronavirus 2 by RT PCR NEGATIVE NEGATIVE Final    Comment: (NOTE) SARS-CoV-2 target nucleic acids are NOT DETECTED.  The SARS-CoV-2 RNA is generally detectable in upper respiratory specimens during the acute phase of infection. The lowest concentration of SARS-CoV-2 viral copies this assay can detect is 138 copies/mL. A negative result does not preclude SARS-Cov-2 infection and should not be used as the sole basis for treatment or other patient management decisions. A negative result may occur with  improper specimen collection/handling, submission of specimen other than nasopharyngeal swab, presence of viral mutation(s) within the areas targeted by this assay, and inadequate number of viral copies(<138 copies/mL). A negative result must be combined with clinical observations, patient history, and epidemiological information. The expected result is Negative.  Fact Sheet for  Patients:  bloggercourse.com  Fact Sheet for Healthcare Providers:  seriousbroker.it  This test is no t yet approved or cleared by the [place]  FDA and  has been authorized for detection and/or diagnosis of SARS-CoV-2 by FDA under an Emergency Use Authorization (EUA). This EUA will remain  in effect (meaning this test can be used) for the duration of the COVID-19 declaration under Section 564(b)(1) of the Act, 21 U.S.C.section 360bbb-3(b)(1), unless the authorization is terminated  or revoked sooner.       Influenza A by PCR POSITIVE (A) NEGATIVE Final   Influenza B by PCR NEGATIVE NEGATIVE Final    Comment: (NOTE) The Xpert Xpress SARS-CoV-2/FLU/RSV plus assay is intended as an aid in the diagnosis of influenza from Nasopharyngeal swab specimens and should not be used  as a sole basis for treatment. Nasal washings and aspirates are unacceptable for Xpert Xpress SARS-CoV-2/FLU/RSV testing.  Fact Sheet for Patients: bloggercourse.com  Fact Sheet for Healthcare Providers: seriousbroker.it  This test is not yet approved or cleared by the [place]  FDA and has been authorized for detection and/or diagnosis of SARS-CoV-2 by FDA under an Emergency Use Authorization (EUA). This EUA will remain in effect (meaning this test can be used) for the duration of the COVID-19 declaration under Section 564(b)(1) of the Act, 21 U.S.C. section 360bbb-3(b)(1), unless the authorization is terminated or revoked.     Resp Syncytial Virus by PCR NEGATIVE NEGATIVE Final    Comment: (NOTE) Fact Sheet for Patients: bloggercourse.com  Fact Sheet for Healthcare Providers: seriousbroker.it  This test is not yet approved or cleared by the [place]  FDA and has been authorized for detection and/or diagnosis of SARS-CoV-2 by FDA under an Emergency Use Authorization (EUA). This EUA will remain in effect (meaning this test can be used) for the duration of the COVID-19 declaration under Section 564(b)(1) of the Act, 21 U.S.C. section 360bbb-3(b)(1), unless the authorization is terminated or revoked.  Performed at [facility], [address], [place], [name] [address]   Culture, blood (Routine x 2)     Status: None   Collection Time: 07/30/24 10:43 PM   Specimen: BLOOD RIGHT HAND  Result Value Ref Range Status   Specimen Description BLOOD RIGHT HAND  Final   Special Requests   Final    BOTTLES DRAWN AEROBIC AND ANAEROBIC Blood Culture results may not be optimal due to an inadequate volume of blood received in culture bottles   Culture   Final    NO GROWTH 5 DAYS Performed at [facility], [address] [place], [name] [address]    Report Status 08/04/2024 FINAL   Final  MRSA Next Gen by PCR, Nasal     Status: None   Collection Time: 07/31/24  2:28 AM   Specimen: Nasal Mucosa; Nasal Swab  Result Value Ref Range Status   MRSA by PCR Next Gen NOT DETECTED NOT DETECTED Final    Comment: (NOTE) The GeneXpert MRSA Assay (FDA approved for NASAL specimens only), is one component of a comprehensive MRSA colonization surveillance program. It is not intended to diagnose MRSA infection nor to guide or monitor treatment for MRSA infections. Test performance is not FDA approved in patients less than 55 years old. Performed at [facility], [address], [place], [name] [address]     Labs: CBC: Recent Labs  Lab 07/30/24 2229 08/02/24 0335  WBC 7.6 3.4*  NEUTROABS 5.8  --   HGB 14.0 12.2  HCT 45.5 39.1  MCV 88.0 86.7  PLT 246 185   Basic Metabolic Panel: Recent Labs  Lab 07/30/24 2229 07/31/24 0050 08/02/24 0335  NA 142  --  139  K 4.2  --  3.8  CL 104  --  106  CO2 23  --  23  GLUCOSE 109*  --  105*  BUN 25*  --  30*  CREATININE 2.00*  --  1.59*  CALCIUM  9.2  --  8.2*  MG  --  2.1  --   PHOS  --  3.2 2.2*   Liver Function Tests: Recent Labs  Lab 07/30/24 2229 08/02/24 0335  AST 21  --   ALT 11  --   ALKPHOS 103  --   BILITOT 0.4  --   PROT 7.4  --   ALBUMIN 4.3 3.1*   CBG: Recent Labs  Lab 08/03/24 2026 08/03/24 2313 08/04/24 0407 08/04/24 0736 08/04/24 1118  GLUCAP 162* 141* 165* 109* 105*    Discharge time spent: 40 minutes.  Signed: Deliliah Room, MD [facility] 08/04/2024 "

## 2024-08-04 NOTE — Progress Notes (Signed)
 Occupational Therapy Treatment Patient Details Name: Robin [name] MRN: [id number] DOB: January 04, 1966 Today's Date: 08/04/2024   History of present illness Robin [name] is a 58 y.o. 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 CKD 3. DM2. And COPD.  Acc to ER md patient came in with altered mental status and found + flu. Hospitalists consulted for admission. CT head requested given altered mentation and history. Patient unable to answer uestions bedside and no family present  Sister emergency contact called, acf to her patient with aphasia at baseline, independent functioning. She reports patient is the one who called EMS due to pain.  Patient apparently known to EMS and patient not at baseline mental status   OT comments  Pt agreeable to OT treatment. Pt demonstrated bed mobility with CGA today. Pt requested use of RW and was able to ambulate to the sink and toilet with CGA. CGA to min A needed to sit on the toilet, but peri-care completed without physical assist. Pt able to wash hands at the sink with CGA to supervision as well. Pt left in the chair with call bell within reach. Pt will benefit from continued OT in the hospital to increase strength, balance, and endurance for safe ADL's.         If plan is discharge home, recommend the following:  A little help with walking and/or transfers;A lot of help with bathing/dressing/bathroom;Assistance with cooking/housework;Assist for transportation;Help with stairs or ramp for entrance;Assistance with feeding   Equipment Recommendations  None recommended by OT          Precautions / Restrictions Precautions Precautions: Fall Recall of Precautions/Restrictions: Intact Restrictions Weight Bearing Restrictions Per Provider Order: No       Mobility Bed Mobility Overal bed mobility: Needs Assistance Bed Mobility: Supine to Sit     Supine to sit:  Contact guard     General bed mobility comments: Labored effort but no physical assist.    Transfers Overall transfer level: Needs assistance Equipment used: Rolling walker (2 wheels) Transfers: Sit to/from Stand Sit to Stand: Contact guard assist, Min assist           General transfer comment: CGA for STS from EOB. CGA to min A for sit to toilet with RW.     Balance Overall balance assessment: Needs assistance Sitting-balance support: No upper extremity supported, Feet supported Sitting balance-Leahy Scale: Good Sitting balance - Comments: seated at EOB   Standing balance support: Bilateral upper extremity supported, During functional activity Standing balance-Leahy Scale: Fair Standing balance comment: with RW                           ADL either performed or assessed with clinical judgement   ADL Overall ADL's : Needs assistance/impaired     Grooming: Wash/dry hands;Standing;Contact guard assist;Supervision/safety Grooming Details (indicate cue type and reason): Washing hands while standing within the RW with CGA to supervision.                 Toilet Transfer: Contact guard assist;Rolling walker (2 wheels);Ambulation;Minimal assistance Toilet Transfer Details (indicate cue type and reason): CGA to ambulat to the toilet with RW; CGA to min A to lower to the toilet. Toileting- Clothing Manipulation and Hygiene: Modified independent;Sitting/lateral lean Toileting - Clothing Manipulation Details (indicate cue type and reason): Pt completed peri-care without assist.     Functional mobility during ADLs: Contact guard  assist;Rolling walker (2 wheels) General ADL Comments: Pt ambulating in the room for functional ADL tasks with RW and CGA. Pt requested use of RW seeming to state she uses it at baseline.     Communication Communication Communication: Impaired Factors Affecting Communication: Difficulty expressing self   Cognition Arousal:  Alert Behavior During Therapy: WFL for tasks assessed/performed Cognition: No apparent impairments                               Following commands: Intact        Cueing   Cueing Techniques: Verbal cues, Tactile cues                     Pertinent Vitals/ Pain       Pain Assessment Pain Assessment: No/denies pain                                                          Frequency  Min 2X/week        Progress Toward Goals  OT Goals(current goals can now be found in the care plan section)  Progress towards OT goals: Progressing toward goals  Acute Rehab OT Goals Patient Stated Goal: Improve function. OT Goal Formulation: With patient Time For Goal Achievement: 08/16/24 Potential to Achieve Goals: Good ADL Goals Pt Will Perform Grooming: with modified independence;sitting;with adaptive equipment Pt Will Perform Upper Body Bathing: with modified independence;sitting;with adaptive equipment Pt Will Perform Lower Body Bathing: with min assist;sitting/lateral leans Pt Will Perform Upper Body Dressing: with modified independence;sitting;with adaptive equipment Pt Will Perform Lower Body Dressing: with min assist;with adaptive equipment;sitting/lateral leans Pt Will Transfer to Toilet: with modified independence;ambulating Pt Will Perform Toileting - Clothing Manipulation and hygiene: with modified independence;sitting/lateral leans Pt/caregiver will Perform Home Exercise Program: Increased strength;Left upper extremity;Independently  Plan                                      End of Session Equipment Utilized During Treatment: Gait belt;Rolling walker (2 wheels)  OT Visit Diagnosis: Unsteadiness on feet (R26.81);Other abnormalities of gait and mobility (R26.89);Muscle weakness (generalized) (M62.81);Hemiplegia and hemiparesis Hemiplegia - Right/Left: Right Hemiplegia - dominant/non-dominant: Non-Dominant   Activity  Tolerance Patient tolerated treatment well   Patient Left in chair;with call bell/phone within reach   Nurse Communication          Time: 9042-8983 OT Time Calculation (min): 19 min  Charges: OT General Charges $OT Visit: 1 Visit OT Treatments $Self Care/Home Management : 8-22 mins  Loriel Diehl OT, MOT  Robin [name] 08/04/2024, 11:25 AM

## 2024-08-04 NOTE — Plan of Care (Signed)
  Problem: Nutrition: Goal: Adequate nutrition will be maintained Outcome: Progressing   Problem: Coping: Goal: Level of anxiety will decrease Outcome: Progressing   Problem: Pain Managment: Goal: General experience of comfort will improve and/or be controlled Outcome: Progressing   Problem: Safety: Goal: Ability to remain free from injury will improve Outcome: Progressing

## 2024-08-04 NOTE — Discharge Instructions (Signed)
 Robin [name]

## 2024-08-07 ENCOUNTER — Other Ambulatory Visit: Payer: Self-pay | Admitting: Physician Assistant

## 2024-08-10 ENCOUNTER — Ambulatory Visit: Admitting: Internal Medicine

## 2024-08-23 ENCOUNTER — Encounter: Payer: Self-pay | Admitting: Pharmacist

## 2024-08-23 NOTE — Progress Notes (Signed)
 Pharmacy Quality Measure Review  This patient is appearing on the insurance-providing list for being at risk of failing the adherence measure for Statin Use in Persons with Diabetes (SUPD) medications this calendar year.   Per review of chart and payor information, patient has filled a medication indicated for diabetes. She was noted to be on SUPD failure list for 2025 but it looks like she was started on atorvastatin  20mg  daily at discharge from hospital 08/04/2024. Thirty day supply was filled 08/04/2024 and pick up / sold 08/05/2024.   Patient will see PCP tomorrow for hospital follow up 08/24/2024.  She will need an updated Rx for atorvastatin . Reviewed refill history - all other maintenance meds were filled in November or December for 90 day supply.   Madelin Ray, PharmD Clinical Pharmacist [facility]  Population Health [phone]

## 2024-08-24 ENCOUNTER — Ambulatory Visit (INDEPENDENT_AMBULATORY_CARE_PROVIDER_SITE_OTHER): Admitting: Physician Assistant

## 2024-08-24 ENCOUNTER — Ambulatory Visit

## 2024-08-24 ENCOUNTER — Encounter: Payer: Self-pay | Admitting: Physician Assistant

## 2024-08-24 VITALS — BP 132/80 | HR 82 | Temp 98.1°F | Ht 66.0 in | Wt 237.0 lb

## 2024-08-24 DIAGNOSIS — J449 Chronic obstructive pulmonary disease, unspecified: Secondary | ICD-10-CM

## 2024-08-24 DIAGNOSIS — R29898 Other symptoms and signs involving the musculoskeletal system: Secondary | ICD-10-CM

## 2024-08-24 DIAGNOSIS — M25562 Pain in left knee: Secondary | ICD-10-CM

## 2024-08-24 DIAGNOSIS — N184 Chronic kidney disease, stage 4 (severe): Secondary | ICD-10-CM | POA: Diagnosis not present

## 2024-08-24 DIAGNOSIS — E1122 Type 2 diabetes mellitus with diabetic chronic kidney disease: Secondary | ICD-10-CM | POA: Diagnosis not present

## 2024-08-24 DIAGNOSIS — I482 Chronic atrial fibrillation, unspecified: Secondary | ICD-10-CM | POA: Diagnosis not present

## 2024-08-24 DIAGNOSIS — R569 Unspecified convulsions: Secondary | ICD-10-CM

## 2024-08-24 DIAGNOSIS — Z794 Long term (current) use of insulin: Secondary | ICD-10-CM | POA: Diagnosis not present

## 2024-08-24 LAB — CBC WITH DIFFERENTIAL/PLATELET
Basophils Absolute: 0 K/uL (ref 0.0–0.1)
Basophils Relative: 0.8 % (ref 0.0–3.0)
Eosinophils Absolute: 0.2 K/uL (ref 0.0–0.7)
Eosinophils Relative: 3.3 % (ref 0.0–5.0)
HCT: 38.8 % (ref 36.0–46.0)
Hemoglobin: 12.4 g/dL (ref 12.0–15.0)
Lymphocytes Relative: 32.9 % (ref 12.0–46.0)
Lymphs Abs: 1.7 K/uL (ref 0.7–4.0)
MCHC: 32 g/dL (ref 30.0–36.0)
MCV: 84.5 fl (ref 78.0–100.0)
Monocytes Absolute: 0.6 K/uL (ref 0.1–1.0)
Monocytes Relative: 10.8 % (ref 3.0–12.0)
Neutro Abs: 2.7 K/uL (ref 1.4–7.7)
Neutrophils Relative %: 52.2 % (ref 43.0–77.0)
Platelets: 240 K/uL (ref 150.0–400.0)
RBC: 4.6 Mil/uL (ref 3.87–5.11)
RDW: 15.9 % — ABNORMAL HIGH (ref 11.5–15.5)
WBC: 5.1 K/uL (ref 4.0–10.5)

## 2024-08-24 LAB — URINALYSIS, ROUTINE W REFLEX MICROSCOPIC
Bilirubin Urine: NEGATIVE
Hgb urine dipstick: NEGATIVE
Ketones, ur: NEGATIVE
Leukocytes,Ua: NEGATIVE
Nitrite: NEGATIVE
RBC / HPF: NONE SEEN
Specific Gravity, Urine: 1.01 (ref 1.000–1.030)
Urine Glucose: >=1000 — AB
Urobilinogen, UA: 1 (ref 0.0–1.0)
pH: 6.5 (ref 5.0–8.0)

## 2024-08-24 LAB — COMPREHENSIVE METABOLIC PANEL WITH GFR
ALT: 8 U/L (ref 3–35)
AST: 14 U/L (ref 5–37)
Albumin: 3.9 g/dL (ref 3.5–5.2)
Alkaline Phosphatase: 90 U/L (ref 39–117)
BUN: 18 mg/dL (ref 6–23)
CO2: 26 meq/L (ref 19–32)
Calcium: 9.1 mg/dL (ref 8.4–10.5)
Chloride: 107 meq/L (ref 96–112)
Creatinine, Ser: 1.69 mg/dL — ABNORMAL HIGH (ref 0.40–1.20)
GFR: 33.19 mL/min — ABNORMAL LOW
Glucose, Bld: 124 mg/dL — ABNORMAL HIGH (ref 70–99)
Potassium: 4.2 meq/L (ref 3.5–5.1)
Sodium: 140 meq/L (ref 135–145)
Total Bilirubin: 0.4 mg/dL (ref 0.2–1.2)
Total Protein: 6.7 g/dL (ref 6.0–8.3)

## 2024-08-24 LAB — C-REACTIVE PROTEIN: CRP: 1 mg/dL (ref 1.0–20.0)

## 2024-08-24 LAB — CK: Total CK: 88 U/L (ref 17–177)

## 2024-08-24 LAB — URIC ACID: Uric Acid, Serum: 5.8 mg/dL (ref 2.4–7.0)

## 2024-08-24 NOTE — Patient Instructions (Addendum)
" °  VISIT SUMMARY: Today you had a follow-up appointment after your recent hospitalization for influenza. We discussed your knee pain, diabetes management, seizure disorder, and overall health.  YOUR PLAN: KNEE PAIN: You have been experiencing intermittent knee pain, which may be due to osteoarthritis. -We ordered blood work to check for arthritis. -We also ordered a knee x-ray. -If necessary, we may refer you for a steroid injection.  TYPE 2 DIABETES MELLITUS WITH STAGE 4 CHRONIC KIDNEY DISEASE: Your blood sugars were uncontrolled during your hospital stay, but your kidney function has returned to baseline. -We rechecked your calcium  levels. -Continue following up with your nephrologist.  SEIZURE DISORDER: Your seizures are managed with Trileptal  and gabapentin , and you have not had any recent seizures. -Continue taking your current seizure medications as prescribed.    Contains text generated by [employer].   "

## 2024-08-24 NOTE — Progress Notes (Signed)
 "  History of Present Illness:   Chief Complaint  Patient presents with   Hospitalization Follow-up    Pt here for f/u, was admitted on 12/26 for Influenza A.    Discussed the use of AI scribe software for clinical note transcription with the patient, who gave verbal consent to proceed.  History of Present Illness   Robin [name] is a 59 year old female with a history of stroke and heart failure who presents for follow-up after hospitalization for influenza.  She was hospitalized for influenza complicated by dehydration and an acute decline in kidney function, which returned to baseline by discharge. She was discharged home and denies current respiratory issues.  She has stroke with no new neurologic deficits and takes atorvastatin . She reports chronic joint and knee pain that predated the hospitalization and has not worsened. One knee is tender to touch.  During hospitalization, her diabetes regimen was changed. She is now on 44 units of Lantus , Trulicity  weekly, Jardiance  daily, and 12 units of Humalog  three times a day. Her blood sugars were uncontrolled in the hospital. She is seeing her endocrinologist tomorrow.  She has heart failure and atrial fibrillation followed by cardiology. She takes Coreg  25 mg daily, a hydralazine  25 mg three times daily, imdur  30 mg 24 hr daily, and Lasix  20 mg daily. She monitors her weight daily. Her weight was higher in the hospital, likely from fluid, and has returned to baseline.  She has seizures treated with Trileptal  and gabapentin . Trileptal  was adjusted to half a dose in the morning and half in the evening for headaches, and she has not had seizures since.  She has COPD without breathing issues since discharge. She is taking her medications as prescribed and follows with nephrology for kidney disease.  She drinks soda and tea as well as water .        Past Medical History:  Diagnosis Date   Allergy    Anemia    Aortic stenosis    a.  02/2023 Echo: mild-mod AS.   Arthritis    Chronic abdominal pain    Chronic HFrEF (heart failure with reduced ejection fraction) (HCC)    a. EF at 30-35% by echo in 08/2018 b. EF at 35% by repeat echo in 05/2019 c. EF improved to 65-70% in 2021 d. EF at 40-45% in 08/2021; e. 02/2023 Echo: 40-45%, glob HK, mod LVH, nl RV size/fx, sev BAE, mild-mod AS.   Cocaine abuse (HCC)    COPD (chronic obstructive pulmonary disease) (HCC)    Diabetes mellitus without complication (HCC)    Essential hypertension, benign    Expressive aphasia    post CVA   Fatty liver    GERD (gastroesophageal reflux disease)    Gout 2016   Normal coronary arteries    a. 10/2008 abnl MV; b. 10/2008 Cath: nl cors.   Ovarian cyst    Permanent atrial fibrillation (HCC)    Stroke (HCC) 12/26/2019   Right sided weakness, and expressive aphasia   Thoracic ascending aortic aneurysm    a. 4 cm 10/31/19 CTA; b. 02/2023 Echo: Nl Ao root.   Type 2 diabetes mellitus (HCC)    type II     Social History[1]  Past Surgical History:  Procedure Laterality Date   ABDOMINAL HYSTERECTOMY  09/10/2011   Procedure: HYSTERECTOMY ABDOMINAL;  Surgeon: Norleen LULLA Server, MD;  Location: [facility];  Service: Gynecology;  Laterality: N/A;  Abdominal hysterectomy   CESAREAN SECTION  8011,8007, and 1994  CHOLECYSTECTOMY  1995   IR 3D INDEPENDENT WKST  03/16/2020   IR ANGIO INTRA EXTRACRAN SEL INTERNAL CAROTID BILAT MOD SED  03/16/2020   IR ANGIO VERTEBRAL SEL SUBCLAVIAN INNOMINATE UNI L MOD SED  03/16/2020   IR ANGIO VERTEBRAL SEL VERTEBRAL UNI R MOD SED  03/16/2020   IR CT HEAD LTD  12/27/2019   IR PERCUTANEOUS ART THROMBECTOMY/INFUSION INTRACRANIAL INC DIAG ANGIO  12/27/2019   IR US  GUIDE VASC ACCESS RIGHT  12/27/2019   IR US  GUIDE VASC ACCESS RIGHT  03/16/2020   RADIOLOGY WITH ANESTHESIA N/A 12/27/2019   Procedure: IR WITH ANESTHESIA;  Surgeon: Dolphus Carrion, MD;  Location: [facility];  Service: Radiology;  Laterality: N/A;   SCAR REVISION  09/10/2011    Procedure: SCAR REVISION;  Surgeon: Norleen LULLA Server, MD;  Location: [facility];  Service: Gynecology;  Laterality: N/A;  Wide Excision of old Cicatrix   TUBAL LIGATION  1994    Family History  Problem Relation Age of Onset   Cirrhosis Mother    Early death Mother    Alcohol  abuse Mother    Diabetes type II Father    Alcohol  abuse Father    Diabetes type II Sister    Early death Brother    Alcohol  abuse Son    Anesthesia problems Neg Hx    Hypotension Neg Hx    Malignant hyperthermia Neg Hx    Pseudochol deficiency Neg Hx    Colon cancer Neg Hx    Colon polyps Neg Hx    Esophageal cancer Neg Hx    Rectal cancer Neg Hx    Stomach cancer Neg Hx     Allergies[2]  Current Medications:  Current Medications[3]   Review of Systems:   Negative unless otherwise specified per HPI.  Vitals:   Vitals:   08/24/24 0849  BP: 132/80  Pulse: 82  Temp: 98.1 F (36.7 C)  TempSrc: Temporal  SpO2: 99%  Weight: 237 lb (107.5 kg)  Height: 5' 6 (1.676 m)     Body mass index is 38.25 kg/m.  Physical Exam:   Physical Exam Vitals and nursing note reviewed.  Constitutional:      General: She is not in acute distress.    Appearance: She is well-developed. She is not ill-appearing or toxic-appearing.  Cardiovascular:     Rate and Rhythm: Normal rate and regular rhythm.     Pulses: Normal pulses.     Heart sounds: Normal heart sounds, S1 normal and S2 normal.  Pulmonary:     Effort: Pulmonary effort is normal.     Breath sounds: Normal breath sounds.  Musculoskeletal:     Comments: Left knee with significant tenderness to palpation to anterior aspect  Skin:    General: Skin is warm and dry.  Neurological:     Mental Status: She is alert.     GCS: GCS eye subscore is 4. GCS verbal subscore is 5. GCS motor subscore is 6.  Psychiatric:        Speech: Speech normal.        Behavior: Behavior normal. Behavior is cooperative.     Assessment and Plan:   Assessment and Plan     Acute pain of left knee Intermittent knee pain, possibly due to osteoarthritis. Differential includes arthritis. - Ordered blood work for arthritis. - Ordered knee x-ray. - Consider referral for steroid injection if indicated.  Muscular deconditioning Unable to go to skilled nursing facility after D/C due to lack of availability of beds  Interested in physical therapy -- will order at this time to help with strengthening post hospitalization  Type 2 diabetes mellitus with stage 4 chronic kidney disease, with long-term current use of insulin  (HCC) Blood sugars were uncontrolled during hospitalization. Kidney function returned to baseline post-hospitalization.  - Continue nephrologist follow-up -- we do not have records of her visits and have had her sign record release today for this - Seeing endo tomorrow  Seizure disorder [facility]) Managed with Trileptal  and gabapentin . No recent seizures reported. Gabapentin  dose adjusted due to headaches. - Continue current seizure medication regimen.     Atrial fibrillation, chronic (HCC) Rate controlled and compliant with Direct acting Oral AntiCoagulant (DOAC) -- continue eliquis  5 mg twice daily   Chronic obstructive pulmonary disease, unspecified COPD type (HCC) Currently well controlled without inhalers  Discussed that patient needs regular follow up every 3 months for ongoing chronic medical issues  I spent a total of 60 minutes on this visit, today 08/24/24, which included reviewing previous notes from hospitalization on 07/30/24, ordering tests, discussing plan of care with patient and using shared-decision making on next steps, and documenting the findings in the note.   Lucie Buttner, PA-C    [1]  Social History Tobacco Use   Smoking status: Former    Current packs/day: 0.00    Types: Cigarettes    Start date: 12/1990    Quit date: 12/2019    Years since quitting: 4.7   Smokeless tobacco: Never  Vaping Use   Vaping status:  Never Used  Substance Use Topics   Alcohol  use: Not Currently    Comment: occ   Drug use: Not Currently  [2]  Allergies Allergen Reactions   Bee Venom Shortness Of Breath and Swelling    Bodily Swelling   Losartan  Other (See Comments)    Nosebleeds   Naproxen  Other (See Comments)    Acid reflux   Lisinopril  Other (See Comments)    Sinus congestion   Penicillins Nausea Only  [3]  Current Outpatient Medications:    Accu-Chek Softclix Lancets lancets, Use as instructed, Disp: 100 each, Rfl: 12   apixaban  (ELIQUIS ) 5 MG TABS tablet, Take 1 tablet (5 mg total) by mouth 2 (two) times daily., Disp: 180 tablet, Rfl: 0   aspirin  EC 81 MG tablet, Take 1 tablet (81 mg total) by mouth daily. Swallow whole., Disp: 90 tablet, Rfl: 3   atorvastatin  (LIPITOR) 20 MG tablet, Take 1 tablet (20 mg total) by mouth daily., Disp: 30 tablet, Rfl: 0   Blood Glucose Monitoring Suppl (ACCU-CHEK GUIDE ME) w/Device KIT, 1 Device by Does not apply route 3 (three) times daily., Disp: 1 kit, Rfl: 0   carvedilol  (COREG ) 25 MG tablet, TAKE 2 TABLETS BY MOUTH TWICE DAILY WITH A MEAL, Disp: 360 tablet, Rfl: 3   Dulaglutide  (TRULICITY ) 4.5 MG/0.5ML SOAJ, Inject 4.5 mg into the skin every Tuesday., Disp: , Rfl:    empagliflozin  (JARDIANCE ) 25 MG TABS tablet, Take 1 tablet (25 mg total) by mouth daily before breakfast., Disp: 90 tablet, Rfl: 3   Ferrous Sulfate  (IRON) 325 (65 Fe) MG TABS, Take 1 tablet by mouth once daily, Disp: 90 tablet, Rfl: 0   furosemide  (LASIX ) 20 MG tablet, TAKE 1 TABLET BY MOUTH ONCE DAILY (MAY TAKE ADDITIONAL 20 MG IF WEIGHT GAIN IS OVER 2 LBS OVERNIGHT OR 5 LBS IN A WEEK), Disp: 105 tablet, Rfl: 1   gabapentin  (NEURONTIN ) 100 MG capsule, TAKE 2 CAPSULES BY MOUTH THREE TIMES DAILY, Disp: 180  capsule, Rfl: 0   glucose blood (ACCU-CHEK GUIDE TEST) test strip, USE 1 STRIP TO CHECK GLUCOSE THREE TIMES DAILY, Disp: 300 each, Rfl: 5   hydrALAZINE  (APRESOLINE ) 25 MG tablet, TAKE 1 TABLET BY MOUTH THREE  TIMES DAILY, Disp: 270 tablet, Rfl: 0   insulin  lispro (HUMALOG  KWIKPEN) 100 UNIT/ML KwikPen, Inject 2 Units into the skin 3 (three) times daily. (Patient taking differently: Inject 12 Units into the skin 3 (three) times daily.), Disp: , Rfl:    Insulin  Pen Needle (BD PEN NEEDLE NANO U/F) 32G X 4 MM MISC, Inject 1 Device into the skin in the morning, at noon, in the evening, and at bedtime., Disp: 400 each, Rfl: 3   isosorbide  mononitrate (IMDUR ) 30 MG 24 hr tablet, Take 2 tablets by mouth once daily, Disp: 180 tablet, Rfl: 0   LANTUS  SOLOSTAR 100 UNIT/ML Solostar Pen, Inject 44 Units into the skin at bedtime., Disp: , Rfl:    Oxcarbazepine  (TRILEPTAL ) 300 MG tablet, Take 0.5 tablets (150 mg total) by mouth 2 (two) times daily., Disp: , Rfl:    pantoprazole  (PROTONIX ) 40 MG tablet, Take 1 tablet by mouth once daily, Disp: 90 tablet, Rfl: 0   timolol  (TIMOPTIC ) 0.5 % ophthalmic solution, Place 1 drop into both eyes 2 (two) times daily., Disp: , Rfl:    tobramycin  (TOBREX ) 0.3 % ophthalmic solution, Place 1 drop into both eyes 4 (four) times daily., Disp: , Rfl:   "

## 2024-08-25 ENCOUNTER — Ambulatory Visit (INDEPENDENT_AMBULATORY_CARE_PROVIDER_SITE_OTHER): Admitting: Internal Medicine

## 2024-08-25 VITALS — BP 142/90 | Ht 66.0 in | Wt 237.0 lb

## 2024-08-25 DIAGNOSIS — E1159 Type 2 diabetes mellitus with other circulatory complications: Secondary | ICD-10-CM

## 2024-08-25 DIAGNOSIS — E1122 Type 2 diabetes mellitus with diabetic chronic kidney disease: Secondary | ICD-10-CM

## 2024-08-25 DIAGNOSIS — E1165 Type 2 diabetes mellitus with hyperglycemia: Secondary | ICD-10-CM

## 2024-08-25 DIAGNOSIS — N1831 Chronic kidney disease, stage 3a: Secondary | ICD-10-CM | POA: Diagnosis not present

## 2024-08-25 DIAGNOSIS — Z794 Long term (current) use of insulin: Secondary | ICD-10-CM

## 2024-08-25 DIAGNOSIS — E1143 Type 2 diabetes mellitus with diabetic autonomic (poly)neuropathy: Secondary | ICD-10-CM | POA: Diagnosis not present

## 2024-08-25 LAB — POCT GLYCOSYLATED HEMOGLOBIN (HGB A1C): Hemoglobin A1C: 6.6 % — AB (ref 4.0–5.6)

## 2024-08-25 LAB — SEDIMENTATION RATE: Sed Rate: 6 mm/h (ref 0–30)

## 2024-08-25 MED ORDER — INSULIN LISPRO (1 UNIT DIAL) 100 UNIT/ML (KWIKPEN): 12.0000 [IU] | PEN_INJECTOR | Freq: Three times a day (TID) | SUBCUTANEOUS | 6 refills | Status: AC

## 2024-08-25 MED ORDER — TRULICITY 4.5 MG/0.5ML ~~LOC~~ SOAJ: 4.5000 mg | SUBCUTANEOUS | 3 refills | Status: AC

## 2024-08-25 MED ORDER — LANTUS SOLOSTAR 100 UNIT/ML ~~LOC~~ SOPN: 44.0000 [IU] | PEN_INJECTOR | Freq: Every day | SUBCUTANEOUS | 3 refills | Status: AC

## 2024-08-25 MED ORDER — INSULIN PEN NEEDLE 32G X 4 MM MISC: 1.0000 | Freq: Four times a day (QID) | 3 refills | Status: AC

## 2024-08-25 MED ORDER — EMPAGLIFLOZIN 25 MG PO TABS: 25.0000 mg | ORAL_TABLET | Freq: Every day | ORAL | 3 refills | Status: AC

## 2024-08-25 NOTE — Patient Instructions (Addendum)
-   Continue Trulicity  4.5 mg ONCE weekly  - Continue  Jardiance  25 mg daily  - Continue Lantus   44  units daily  - Continue Humalog  12 units with Breakfast, Lunch and Dinner    HOW TO TREAT LOW BLOOD SUGARS (Blood sugar LESS THAN 70 MG/DL) Please follow the RULE OF 15 for the treatment of hypoglycemia treatment (when your (blood sugars are less than 70 mg/dL)   STEP 1: Take 15 grams of carbohydrates when your blood sugar is low, which includes:  3-4 GLUCOSE TABS  OR 3-4 OZ OF JUICE OR REGULAR SODA OR ONE TUBE OF GLUCOSE GEL    STEP 2: RECHECK blood sugar in 15 MINUTES STEP 3: If your blood sugar is still low at the 15 minute recheck --> then, go back to STEP 1 and treat AGAIN with another 15 grams of carbohydrates.

## 2024-08-25 NOTE — Progress Notes (Signed)
 "   Name: Robin [name]  Age/ Sex: 59 y.o., female   MRN/ DOB: [id number], 11-23-1965     PCP: Job Lukes, [place]   Reason for Endocrinology Evaluation: Type 2 Diabetes Mellitus  Initial Endocrine Consultative Visit: 10/01/2018    PATIENT IDENTIFIER: Robin [name] is a 59 y.o. female with a past medical history of T2DM, CHF, COPD , CVA, A.Fib with RVR and GERD. The patient has followed with Endocrinology clinic since 10/01/2018 for consultative assistance with management of her diabetes.  DIABETIC HISTORY:  Robin [name] was diagnosed with DM > 20 yrs ago. Has been on Janumet  and Glipizide in the past. Has been on insulin  for years. Her hemoglobin A1c has ranged from 7.9% in 2017, peaking at 11.3% in 2020  Started trulicity  06/2021 Started  Jardiance  04/2022  SUBJECTIVE:   During the last visit (04/07/2024): A1c 8.0%    Today (08/25/2024): Robin [name] is here for a follow up on diabetes management.  She is accompanied by her sister today.   She checks glucose 3  a day.  Patient has been noted with 1 episode of hypoglycemia 62 MGs/DL in the afternoon  The patient presented to the ED with influenza infection 07/30/2024 Patient follows with [facility] for CKD  Patient continues to follow up  with cardiology for A-fib, CHF Patient follows with neurology for focal seizures  The patient has recovered from influenza infection, she initially had low oral intake which has resulted in hypoglycemia, but her appetite has been back to normal  HOME DIABETES REGIMEN:  Jardiance  25 mg daily  Trulicity  4.5 mg weekly  Lantus  44 units daily Humalog  12 units 3 times daily before every meal    METER DOWNLOAD SUMMARY:  This a.m. 108 MGs/DL  Yesterday evening time 185 MGs/DL Yesterday lunchtime 839 Yesterday fasting 150 MGs/DL  84- 425mg /dL   DIABETIC COMPLICATIONS: Microvascular complications:  Neuropathy Denies: CKD, retinopathy  Last eye exam: Completed many years ago     Macrovascular complications:  CVA 01/2020 Denies: CAD, PVA    HISTORY:  Past Medical History:  Past Medical History:  Diagnosis Date   Allergy    Anemia    Aortic stenosis    a. 02/2023 Echo: mild-mod AS.   Arthritis    Chronic abdominal pain    Chronic HFrEF (heart failure with reduced ejection fraction) (HCC)    a. EF at 30-35% by echo in 08/2018 b. EF at 35% by repeat echo in 05/2019 c. EF improved to 65-70% in 2021 d. EF at 40-45% in 08/2021; e. 02/2023 Echo: 40-45%, glob HK, mod LVH, nl RV size/fx, sev BAE, mild-mod AS.   Cocaine abuse (HCC)    COPD (chronic obstructive pulmonary disease) (HCC)    Diabetes mellitus without complication (HCC)    Essential hypertension, benign    Expressive aphasia    post CVA   Fatty liver    GERD (gastroesophageal reflux disease)    Gout 2016   Normal coronary arteries    a. 10/2008 abnl MV; b. 10/2008 Cath: nl cors.   Ovarian cyst    Permanent atrial fibrillation (HCC)    Stroke (HCC) 12/26/2019   Right sided weakness, and expressive aphasia   Thoracic ascending aortic aneurysm    a. 4 cm 10/31/19 CTA; b. 02/2023 Echo: Nl Ao root.   Type 2 diabetes mellitus (HCC)    type II   Past Surgical History:  Past Surgical History:  Procedure Laterality Date   ABDOMINAL HYSTERECTOMY  09/10/2011   Procedure: HYSTERECTOMY ABDOMINAL;  Surgeon: Norleen LULLA Server, MD;  Location: [facility];  Service: Gynecology;  Laterality: N/A;  Abdominal hysterectomy   CESAREAN SECTION  [phone], and 1994   CHOLECYSTECTOMY  1995   IR 3D INDEPENDENT WKST  03/16/2020   IR ANGIO INTRA EXTRACRAN SEL INTERNAL CAROTID BILAT MOD SED  03/16/2020   IR ANGIO VERTEBRAL SEL SUBCLAVIAN INNOMINATE UNI L MOD SED  03/16/2020   IR ANGIO VERTEBRAL SEL VERTEBRAL UNI R MOD SED  03/16/2020   IR CT HEAD LTD  12/27/2019   IR PERCUTANEOUS ART THROMBECTOMY/INFUSION INTRACRANIAL INC DIAG ANGIO  12/27/2019   IR US  GUIDE VASC ACCESS RIGHT  12/27/2019   IR US  GUIDE VASC ACCESS RIGHT  03/16/2020    RADIOLOGY WITH ANESTHESIA N/A 12/27/2019   Procedure: IR WITH ANESTHESIA;  Surgeon: Dolphus Carrion, MD;  Location: [facility];  Service: Radiology;  Laterality: N/A;   SCAR REVISION  09/10/2011   Procedure: SCAR REVISION;  Surgeon: Norleen LULLA Server, MD;  Location: [facility];  Service: Gynecology;  Laterality: N/A;  Wide Excision of old Cicatrix   TUBAL LIGATION  1994   Social History:  reports that she quit smoking about 4 years ago. Her smoking use included cigarettes. She started smoking about 33 years ago. She has never used smokeless tobacco. She reports that she does not currently use alcohol . She reports that she does not currently use drugs. Family History:  Family History  Problem Relation Age of Onset   Cirrhosis Mother    Early death Mother    Alcohol  abuse Mother    Diabetes type II Father    Alcohol  abuse Father    Diabetes type II Sister    Early death Brother    Alcohol  abuse Son    Anesthesia problems Neg Hx    Hypotension Neg Hx    Malignant hyperthermia Neg Hx    Pseudochol deficiency Neg Hx    Colon cancer Neg Hx    Colon polyps Neg Hx    Esophageal cancer Neg Hx    Rectal cancer Neg Hx    Stomach cancer Neg Hx      HOME MEDICATIONS: Allergies as of 08/25/2024       Reactions   Bee Venom Shortness Of Breath, Swelling   Bodily Swelling   Losartan  Other (See Comments)   Nosebleeds   Naproxen  Other (See Comments)   Acid reflux   Lisinopril  Other (See Comments)   Sinus congestion   Penicillins Nausea Only        Medication List        Accurate as of August 25, 2024  8:12 AM. If you have any questions, ask your nurse or doctor.          Accu-Chek Guide Me w/Device Kit 1 Device by Does not apply route 3 (three) times daily.   Accu-Chek Guide Test test strip Generic drug: glucose blood USE 1 STRIP TO CHECK GLUCOSE THREE TIMES DAILY   Accu-Chek Softclix Lancets lancets Use as instructed   apixaban  5 MG Tabs tablet Commonly known as: Eliquis  Take  1 tablet (5 mg total) by mouth 2 (two) times daily.   aspirin  EC 81 MG tablet Take 1 tablet (81 mg total) by mouth daily. Swallow whole.   atorvastatin  20 MG tablet Commonly known as: LIPITOR Take 1 tablet (20 mg total) by mouth daily.   BD Pen Needle Nano U/F 32G X 4 MM Misc Generic drug: Insulin  Pen Needle Inject 1 Device  into the skin in the morning, at noon, in the evening, and at bedtime.   carvedilol  25 MG tablet Commonly known as: COREG  TAKE 2 TABLETS BY MOUTH TWICE DAILY WITH A MEAL   empagliflozin  25 MG Tabs tablet Commonly known as: Jardiance  Take 1 tablet (25 mg total) by mouth daily before breakfast.   furosemide  20 MG tablet Commonly known as: LASIX  TAKE 1 TABLET BY MOUTH ONCE DAILY (MAY TAKE ADDITIONAL 20 MG IF WEIGHT GAIN IS OVER 2 LBS OVERNIGHT OR 5 LBS IN A WEEK)   gabapentin  100 MG capsule Commonly known as: NEURONTIN  TAKE 2 CAPSULES BY MOUTH THREE TIMES DAILY   hydrALAZINE  25 MG tablet Commonly known as: APRESOLINE  TAKE 1 TABLET BY MOUTH THREE TIMES DAILY   insulin  lispro 100 UNIT/ML KwikPen Commonly known as: HumaLOG  KwikPen Inject 2 Units into the skin 3 (three) times daily. What changed: how much to take   Iron 325 (65 Fe) MG Tabs Take 1 tablet by mouth once daily   isosorbide  mononitrate 30 MG 24 hr tablet Commonly known as: IMDUR  Take 2 tablets by mouth once daily   Lantus  SoloStar 100 UNIT/ML Solostar Pen Generic drug: insulin  glargine Inject 44 Units into the skin at bedtime.   Oxcarbazepine  300 MG tablet Commonly known as: Trileptal  Take 0.5 tablets (150 mg total) by mouth 2 (two) times daily.   pantoprazole  40 MG tablet Commonly known as: PROTONIX  Take 1 tablet by mouth once daily   timolol  0.5 % ophthalmic solution Commonly known as: TIMOPTIC  Place 1 drop into both eyes 2 (two) times daily.   tobramycin  0.3 % ophthalmic solution Commonly known as: TOBREX  Place 1 drop into both eyes 4 (four) times daily.   Trulicity  4.5  MG/0.5ML Soaj Generic drug: Dulaglutide  Inject 4.5 mg into the skin every Tuesday.         OBJECTIVE:   Vital Signs: BP (!) 142/90   Ht 5' 6 (1.676 m)   Wt 237 lb (107.5 kg)   LMP 08/21/2011   BMI 38.25 kg/m   Wt Readings from Last 3 Encounters:  08/25/24 237 lb (107.5 kg)  08/24/24 237 lb (107.5 kg)  08/04/24 251 lb 5.2 oz (114 kg)     Exam: General: Pt appears well and is in NAD  Lungs: Clear with good BS bilat   Heart: RRR   Neuro: MS is good with appropriate affect    DM foot exam:08/25/2024  The skin of the feet is intact without sores or ulcerations. The pedal pulses are 1+ The sensation is decreased  to a screening 5.07, 10 gram monofilament on the left         DATA REVIEWED:  Lab Results  Component Value Date   HGBA1C 8.0 (A) 04/07/2024   HGBA1C 7.9 (A) 10/06/2023   HGBA1C 8.8 (A) 05/09/2023    Latest Reference Range & Units 08/24/24 09:31  Comprehensive metabolic panel with GFR  Rpt !  Sodium 135 - 145 mEq/L 140  Potassium 3.5 - 5.1 mEq/L 4.2  Chloride 96 - 112 mEq/L 107  CO2 19 - 32 mEq/L 26  Glucose 70 - 99 mg/dL 875 (H)  BUN 6 - 23 mg/dL 18  Creatinine 9.59 - 8.79 mg/dL 8.30 (H)  Calcium  8.4 - 10.5 mg/dL 9.1  Alkaline Phosphatase 39 - 117 U/L 90  Albumin 3.5 - 5.2 g/dL 3.9  Uric Acid, Serum 2.4 - 7.0 mg/dL 5.8  AST 5 - 37 U/L 14  ALT 3 - 35 U/L 8  Total  Protein 6.0 - 8.3 g/dL 6.7  Total Bilirubin 0.2 - 1.2 mg/dL 0.4  GFR >39.99 mL/min 33.19 (L)   ASSESSMENT / PLAN / RECOMMENDATIONS:   1) Type 2 Diabetes Mellitus, Optimally controlled, With Neuropathic and CKD III  and macrovascular complications - Most recent A1c of  6.6 %. Goal A1c < 7.0 %.     -A1c has been optimized -Metformin  was discontinued due to low GFR -Main barriers to diabetes self care is memory issues and hx of CVA -She was not able to obtain the Dexcom -The patient does have caregivers who have been signing off on making sure that she takes her medications as  prescribed, which has improved consistency and optimizing her BG readings - No changes at this time   MEDICATIONS: Continue  Lantus   44 units daily Continue Humalog  12 units with each meal Continue  trulicity  4.5 mg weekly  Continue  Jardiance  25 mg daily  EDUCATION / INSTRUCTIONS: BG monitoring instructions: Patient is instructed to check her blood sugars 2 times a day, fasting and supper. Call [facility] if: BG persistently < 70  I reviewed the Rule of 15 for the treatment of hypoglycemia in detail with the patient. Literature supplied.   2) Diabetic complications:  Eye: Does not have known diabetic retinopathy.   Neuro/ Feet: Does  have known diabetic peripheral neuropathy .  Renal: Patient does  have known baseline CKD     F/U in 4 months    Signed electronically by: Stefano Redgie Butts, MD  [facility]  [facility] [address] [place], [name] [address] Phone: [phone] FAX: [phone]   CC: Job Lukes, [place] [address] [name] [address] Phone: [phone]  Fax: [phone]  Return to Endocrinology clinic as below: Future Appointments  Date Time Provider Department Center  11/30/2024  9:00 AM Job Lukes, [place] [facility]  12/20/2024  1:40 PM [facility] ANNUAL WELLNESS VISIT 1 [facility]     "

## 2024-08-26 LAB — URINE CULTURE

## 2024-08-26 LAB — EXTRA URINE SPECIMEN

## 2024-08-26 LAB — CYCLIC CITRUL PEPTIDE ANTIBODY, IGG/IGA: Cyclic Citrullin Peptide Ab: 10 U (ref 0–19)

## 2024-08-26 LAB — ANA: Anti Nuclear Antibody (ANA): NEGATIVE

## 2024-08-26 LAB — RHEUMATOID FACTOR: Rheumatoid fact SerPl-aCnc: <10 [IU]/mL

## 2024-08-30 ENCOUNTER — Ambulatory Visit: Payer: Self-pay | Admitting: Physician Assistant

## 2024-08-30 MED ORDER — FLUCONAZOLE 150 MG PO TABS: 150.0000 mg | ORAL_TABLET | Freq: Once | ORAL | 0 refills | Status: AC

## 2024-09-01 ENCOUNTER — Other Ambulatory Visit: Payer: Self-pay | Admitting: Physician Assistant

## 2024-09-01 ENCOUNTER — Other Ambulatory Visit: Payer: Self-pay | Admitting: *Deleted

## 2024-09-01 MED ORDER — ATORVASTATIN CALCIUM 20 MG PO TABS: 20.0000 mg | ORAL_TABLET | Freq: Every day | ORAL | 0 refills | Status: DC

## 2024-09-07 ENCOUNTER — Telehealth: Payer: Self-pay | Admitting: Pharmacist

## 2024-09-07 ENCOUNTER — Encounter: Payer: Self-pay | Admitting: Pharmacist

## 2024-09-07 NOTE — Progress Notes (Signed)
 Pharmacy Quality Measure Review  This patient is appearing on the insurance-providing list for being at risk of failing the adherence measure for Statin Use in Persons with Diabetes (SUPD) medications this calendar year.  Per review of chart and payor information, patient has filled a medication indicated for diabetes. She was noted to be on SUPD failure list for 2025 but it looks like she was started on atorvastatin  20mg  daily at discharge from hospital 08/04/2024. Thirty day supply was filled 08/04/2024 and pick up / sold 08/05/2024.   Patient saw PCP for hospital follow up 08/24/2024.  She will need an updated Rx for atorvastatin . Reviewed refill history - all other maintenance meds were filled in November or December for 90 day supply. Tried to outreach patient / patient caregiver to discuss atorvastatin  but unable to reach them. LM on VM with CB# [phone].  Will forward refill request to PCP.    Madelin Ray, PharmD Clinical Pharmacist [facility]  Population Health [phone]

## 2024-09-07 NOTE — Telephone Encounter (Signed)
 Per review of chart and payor information, patient has filled a medication indicated for diabetes. She was noted to be on SUPD failure list for 2025 but it looks like she was started on atorvastatin  20mg  daily at discharge from hospital 08/04/2024. Thirty day supply was filled 08/04/2024 and pick up / sold 08/05/2024.    She will need an updated Rx for atorvastatin .  Forwarded request for atorvastatin  Rx to PCP.

## 2024-09-08 ENCOUNTER — Other Ambulatory Visit: Payer: Self-pay | Admitting: Physician Assistant

## 2024-09-08 DIAGNOSIS — I10 Essential (primary) hypertension: Secondary | ICD-10-CM

## 2024-09-08 MED ORDER — ATORVASTATIN CALCIUM 20 MG PO TABS: 20.0000 mg | ORAL_TABLET | Freq: Every day | ORAL | 0 refills | Status: AC

## 2024-09-09 NOTE — Telephone Encounter (Signed)
 Patient's sister called back and she has picked up atorvastatin  Rx. Patient will continue atorvastatin  daily.

## 2024-11-30 ENCOUNTER — Ambulatory Visit: Admitting: Physician Assistant

## 2024-12-20 ENCOUNTER — Ambulatory Visit

## 2024-12-24 ENCOUNTER — Ambulatory Visit: Admitting: Internal Medicine
